# Patient Record
Sex: Male | Born: 1955 | Race: Black or African American | Hispanic: No | Smoking: Current every day smoker
Health system: Southern US, Community
[De-identification: ages and names within clinical notes are randomized; demographics above are authoritative.]

## PROBLEM LIST (undated history)

## (undated) DIAGNOSIS — J449 Chronic obstructive pulmonary disease, unspecified: Secondary | ICD-10-CM

## (undated) DIAGNOSIS — R569 Unspecified convulsions: Secondary | ICD-10-CM

## (undated) DIAGNOSIS — I639 Cerebral infarction, unspecified: Secondary | ICD-10-CM

## (undated) DIAGNOSIS — F329 Major depressive disorder, single episode, unspecified: Secondary | ICD-10-CM

## (undated) DIAGNOSIS — F32A Depression, unspecified: Secondary | ICD-10-CM

## (undated) DIAGNOSIS — I10 Essential (primary) hypertension: Secondary | ICD-10-CM

## (undated) DIAGNOSIS — N400 Enlarged prostate without lower urinary tract symptoms: Secondary | ICD-10-CM

## (undated) HISTORY — DX: Essential (primary) hypertension: I10

## (undated) HISTORY — DX: Depression, unspecified: F32.A

## (undated) HISTORY — DX: Major depressive disorder, single episode, unspecified: F32.9

## (undated) HISTORY — PX: OTHER SURGICAL HISTORY: SHX169

## (undated) HISTORY — DX: Unspecified convulsions: R56.9

---

## 2002-12-16 ENCOUNTER — Encounter: Payer: Self-pay | Admitting: Emergency Medicine

## 2002-12-16 ENCOUNTER — Emergency Department (HOSPITAL_COMMUNITY): Admission: EM | Admit: 2002-12-16 | Discharge: 2002-12-16 | Payer: Self-pay | Admitting: Emergency Medicine

## 2004-08-28 HISTORY — PX: CERVICAL SPINE SURGERY: SHX589

## 2005-02-24 ENCOUNTER — Emergency Department (HOSPITAL_COMMUNITY): Admission: EM | Admit: 2005-02-24 | Discharge: 2005-02-24 | Payer: Self-pay | Admitting: Emergency Medicine
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
## 2009-04-26 ENCOUNTER — Encounter: Admission: RE | Admit: 2009-04-26 | Discharge: 2009-04-26 | Payer: Self-pay | Admitting: Family Medicine

## 2009-04-26 IMAGING — CR DG CHEST 2V
2 series · 2 of 2 positions shown · non-contrast
Comparison: [DATE]

CLINICAL DATA: Chronic cough and weight loss.  Smoker.

CHEST - 2 VIEW

[view not recorded (1 of 2)]
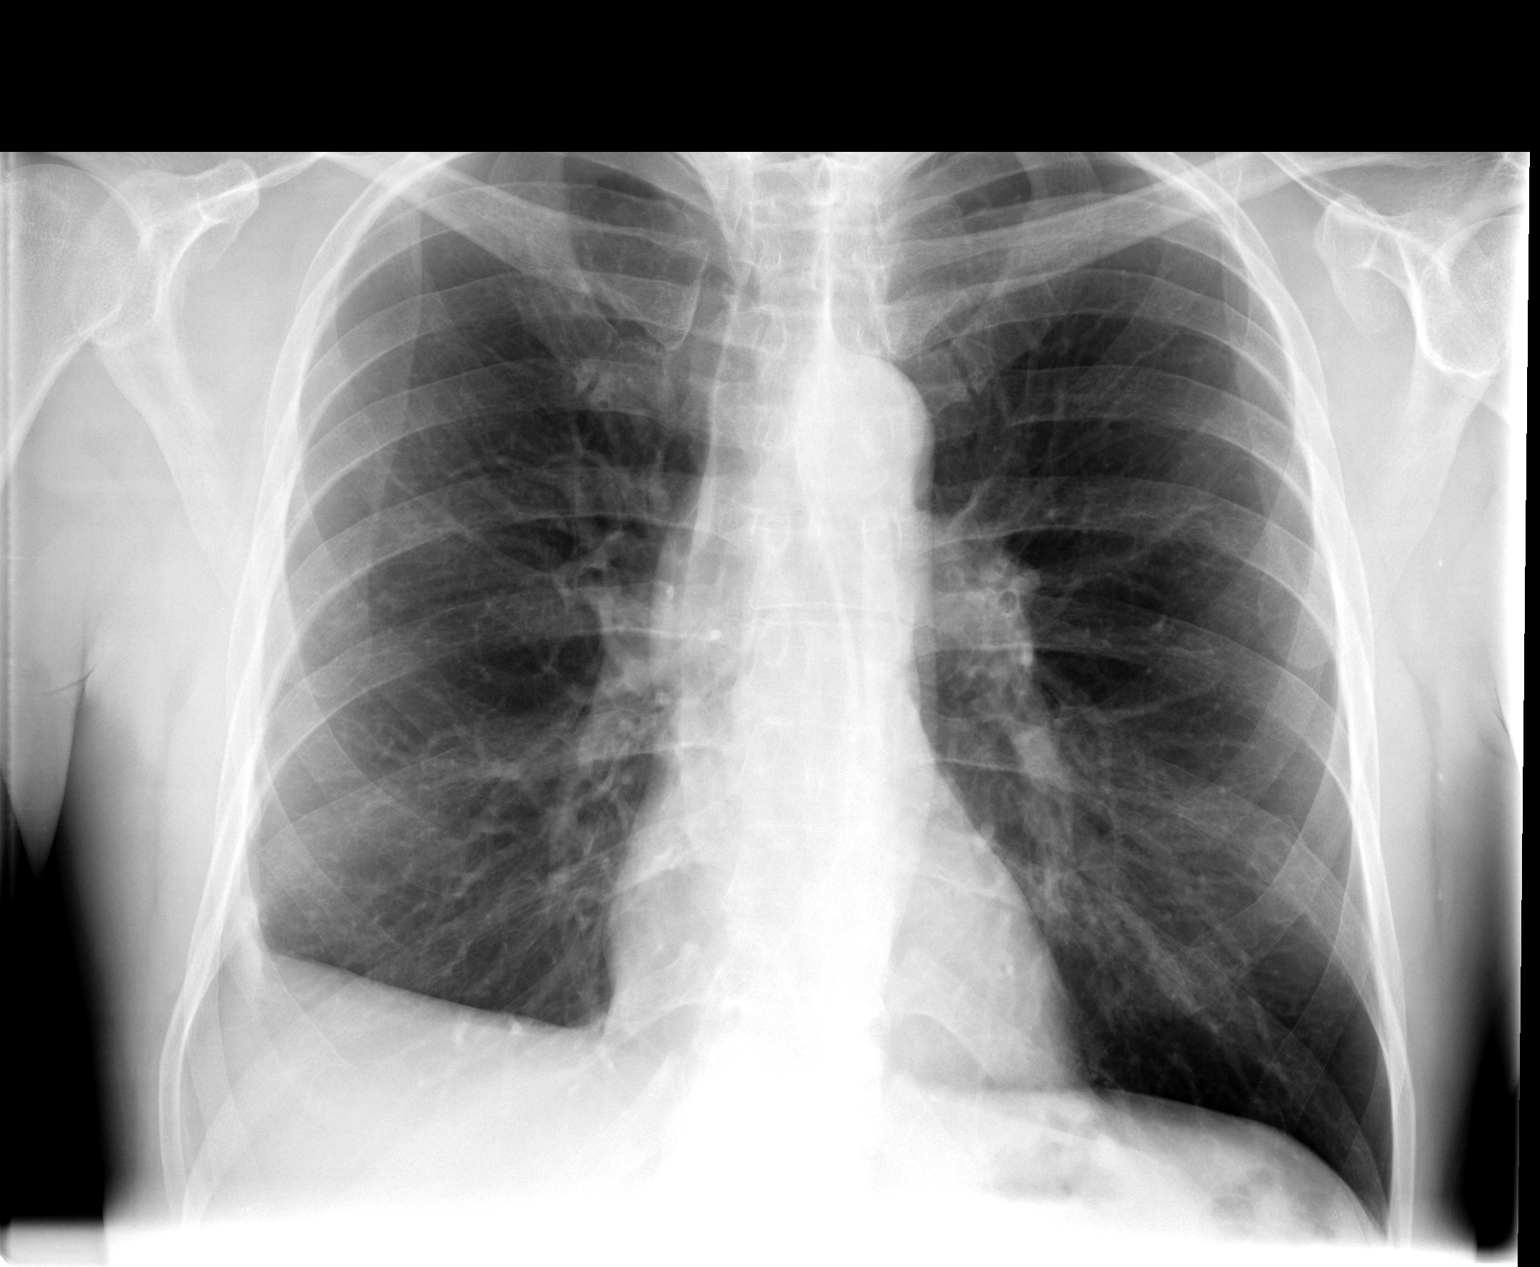

[view not recorded (2 of 2)]
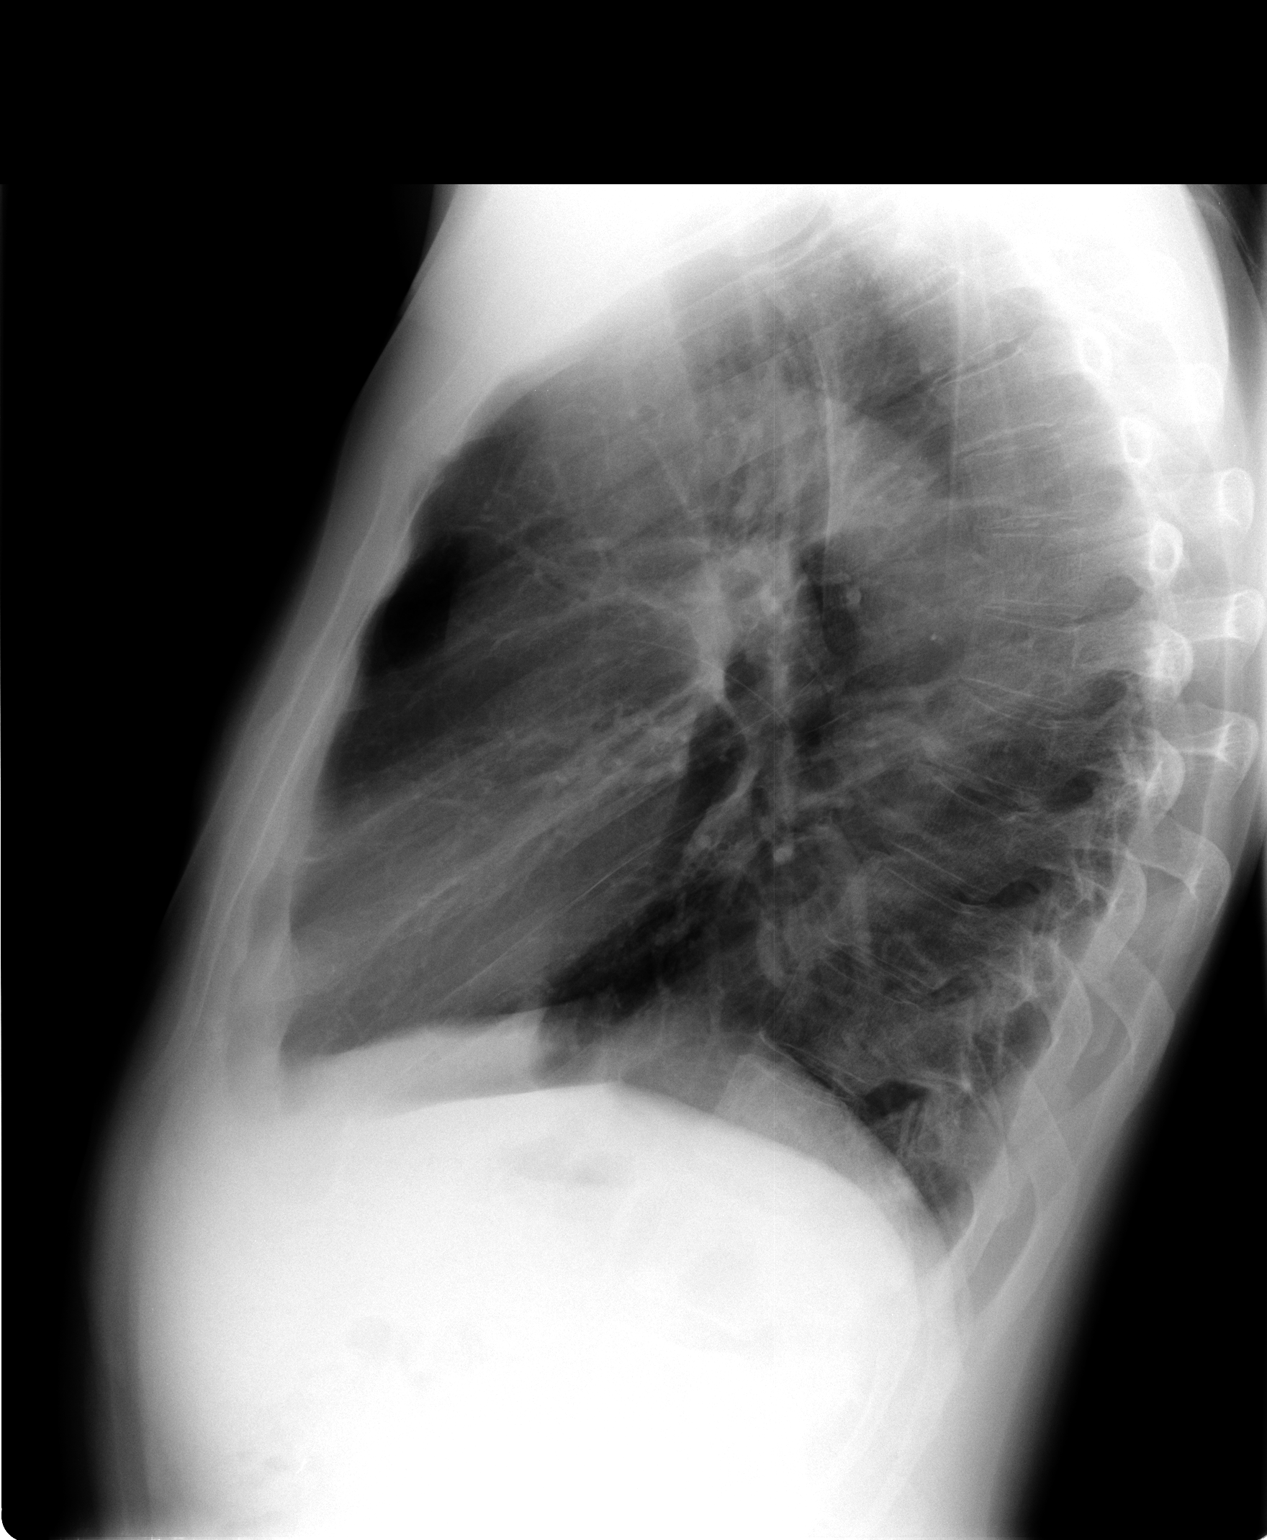

[2 of 2 positions shown; findings below may reference images not displayed]

FINDINGS: Trachea is midline.  Heart size normal.  Trachea is
midline.  Heart size normal.  Ascending aorta is mildly prominent.
There is new blunting of the costophrenic angle, with slight
elevation of the right hemidiaphragm.  Lungs are otherwise clear.
No pleural fluid.
IMPRESSION: Probable pleural parenchymal scarring at the right costophrenic
angle.  If further evaluation is desired, chest CT could be
performed.

## 2009-05-12 ENCOUNTER — Encounter: Admission: RE | Admit: 2009-05-12 | Discharge: 2009-05-12 | Payer: Self-pay | Admitting: Family Medicine

## 2009-05-12 IMAGING — CT CT HEAD W/O CM
1 series · 16 of 28 positions shown, 20 images · non-contrast
Comparison: [DATE]

CLINICAL DATA: Headache.  Memory loss.  Confusion.  Blurred vision.

CT HEAD WITHOUT CONTRAST
TECHNIQUE: Contiguous axial images were obtained from the base of
the skull through the vertex without contrast.

[Series 2: head · axial · 0.49mm/px · z∈[+27,+157]mm · 16 of 28 slices shown, 20 images]
[im 2/28  brain]
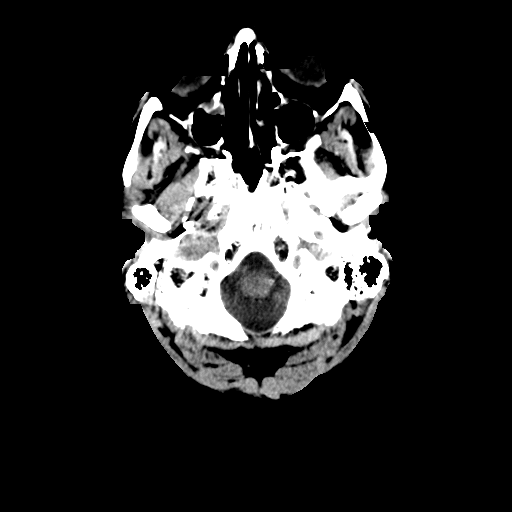
[im 2/28  bone]
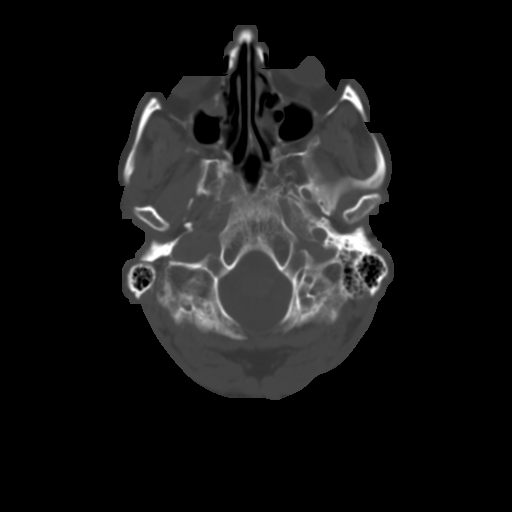
[im 4/28  brain]
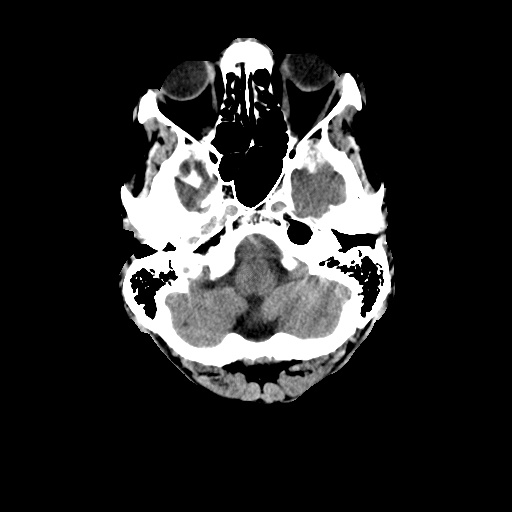
[im 6/28  brain]
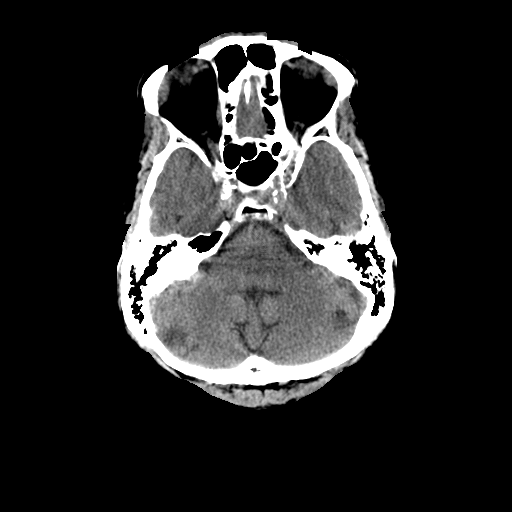
[im 7/28  brain]
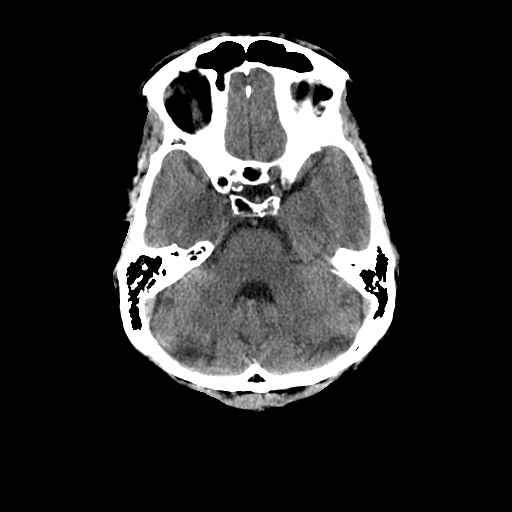
[im 9/28  brain]
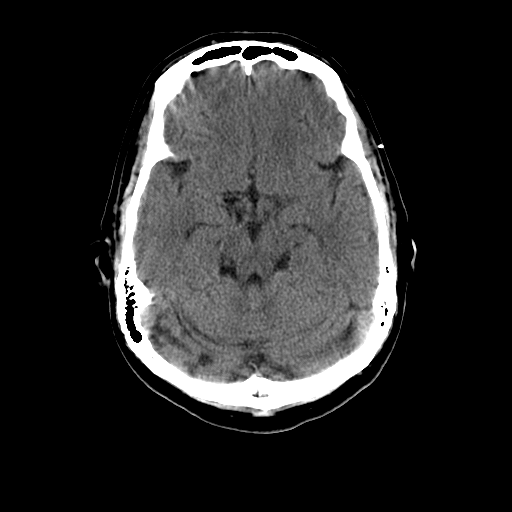
[im 9/28  bone]
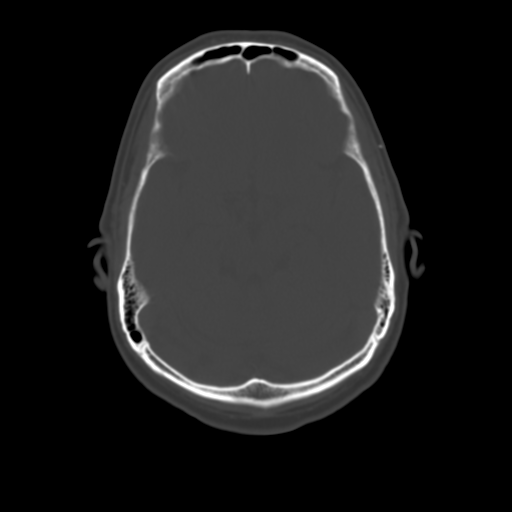
[im 10/28  brain]
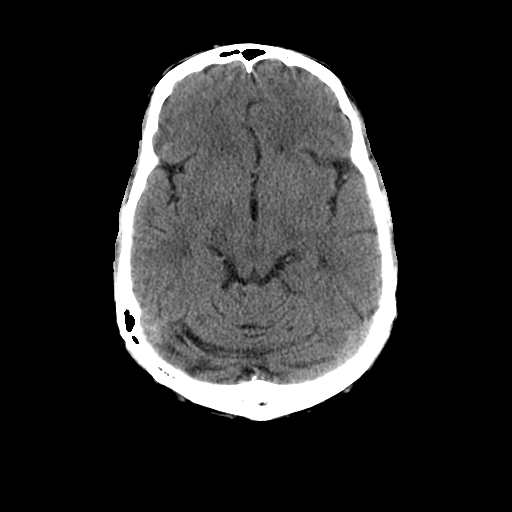
[im 12/28  brain]
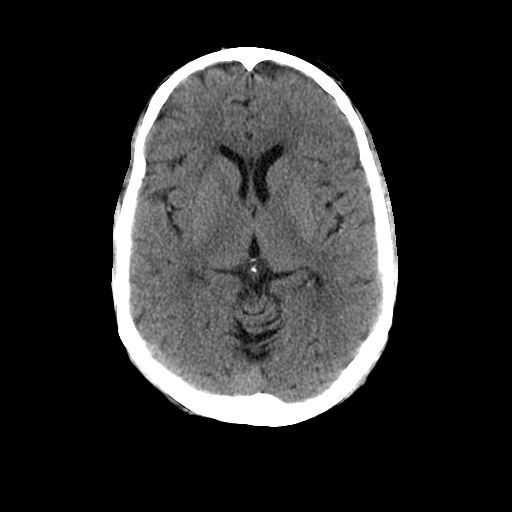
[im 14/28  brain]
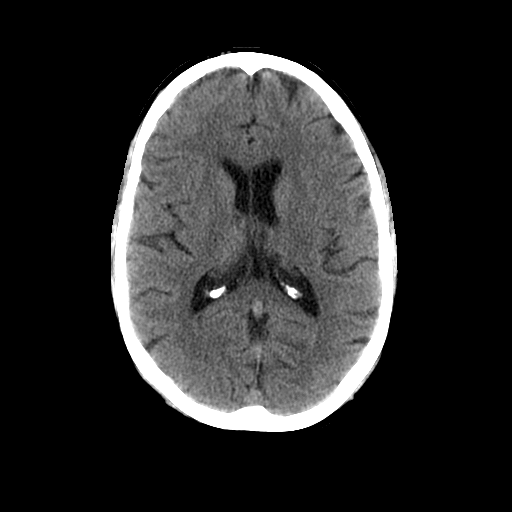
[im 15/28  brain]
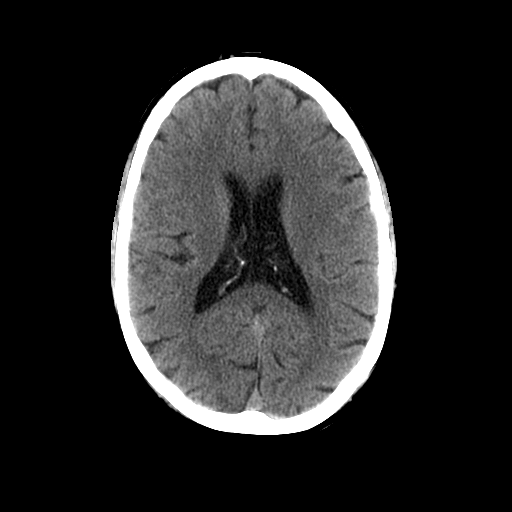
[im 15/28  bone]
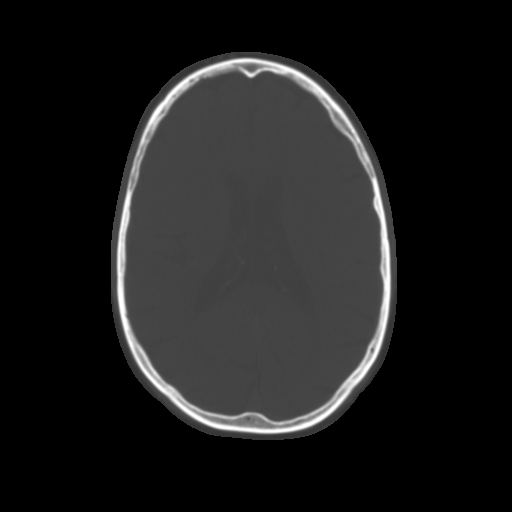
[im 17/28  brain]
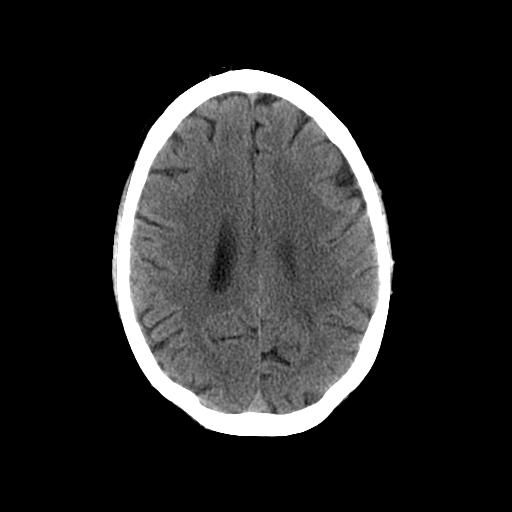
[im 19/28  brain]
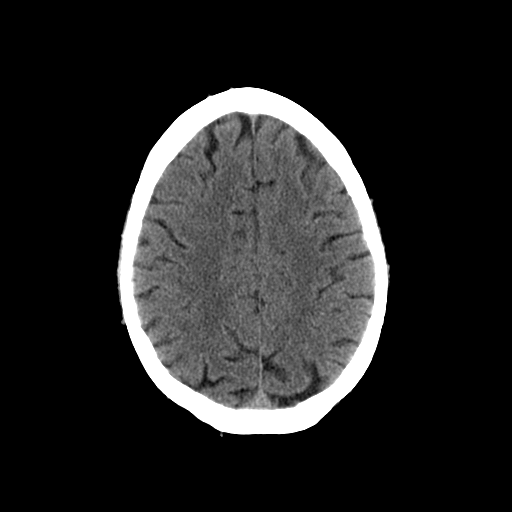
[im 20/28  brain]
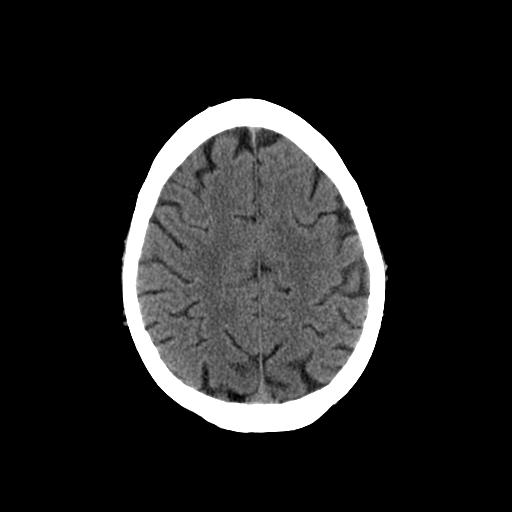
[im 22/28  brain]
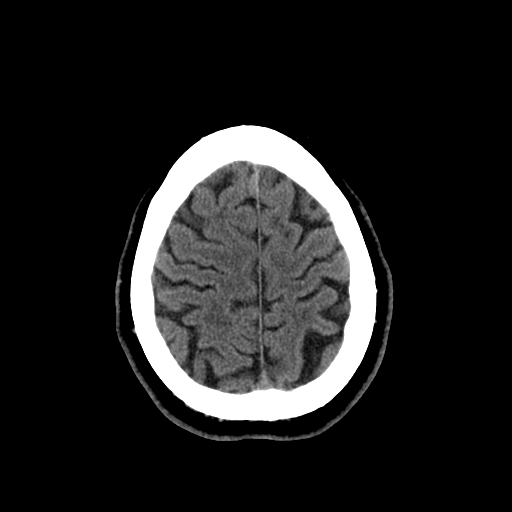
[im 22/28  bone]
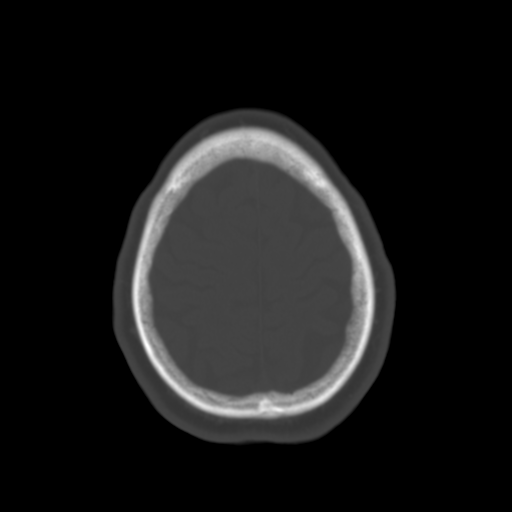
[im 23/28  brain]
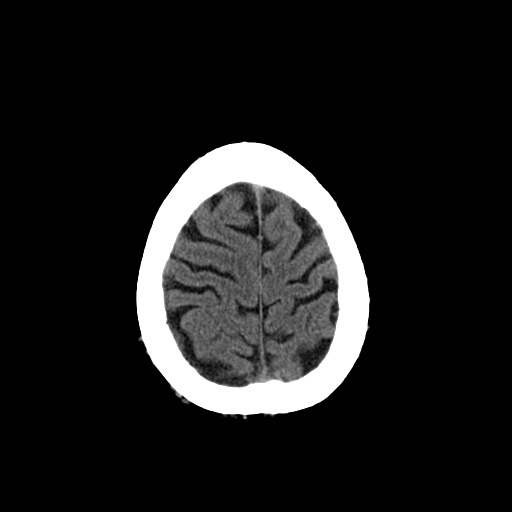
[im 25/28  brain]
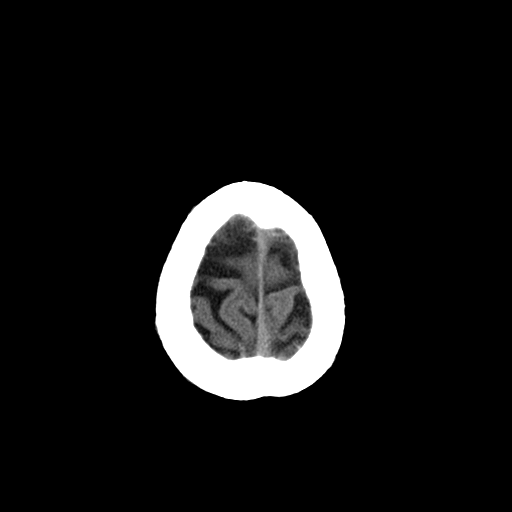
[im 27/28  brain]
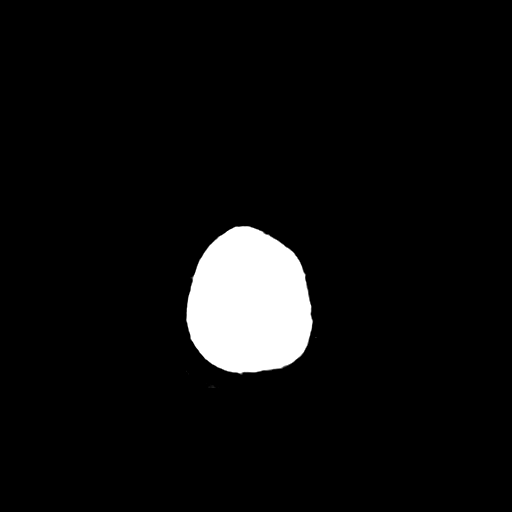

[16 of 28 positions shown; findings below may reference images not displayed]

FINDINGS: There is some progression of generalized brain atrophy.
Old cerebellar infarctions remain evident.  Within the cerebral
hemispheres, there are chronic appearing deep white matter changes
consistent with small vessel disease.  No cortical or large vessel
cerebral hemispheric infarction.  No mass lesion, hemorrhage,
hydrocephalus or extra-axial collection.  Atherosclerotic
calcification effects the major vessels at the base of the brain.
IMPRESSION: Atrophy and chronic cerebellar and cerebral white matter
infarctions.  No sign of acute or reversible process.  Atrophy is
somewhat progressive since the previous exam.

## 2009-08-11 ENCOUNTER — Encounter: Admission: RE | Admit: 2009-08-11 | Discharge: 2009-11-09 | Payer: Self-pay | Admitting: Neurology

## 2010-07-27 ENCOUNTER — Emergency Department (HOSPITAL_COMMUNITY)
Admission: EM | Admit: 2010-07-27 | Discharge: 2010-07-27 | Payer: Self-pay | Source: Home / Self Care | Admitting: Emergency Medicine

## 2010-09-19 ENCOUNTER — Encounter: Payer: Self-pay | Admitting: Family Medicine

## 2011-09-07 ENCOUNTER — Emergency Department (HOSPITAL_COMMUNITY)
Admission: EM | Admit: 2011-09-07 | Discharge: 2011-09-07 | Disposition: A | Discharge status: Home / Self Care | Payer: BC Managed Care – PPO | Attending: Emergency Medicine | Admitting: Emergency Medicine

## 2011-09-07 ENCOUNTER — Encounter (HOSPITAL_COMMUNITY): Payer: Self-pay

## 2011-09-07 DIAGNOSIS — R609 Edema, unspecified: Secondary | ICD-10-CM | POA: Insufficient documentation

## 2011-09-07 DIAGNOSIS — M25579 Pain in unspecified ankle and joints of unspecified foot: Secondary | ICD-10-CM | POA: Insufficient documentation

## 2011-09-07 DIAGNOSIS — M25473 Effusion, unspecified ankle: Secondary | ICD-10-CM | POA: Insufficient documentation

## 2011-09-07 DIAGNOSIS — M109 Gout, unspecified: Secondary | ICD-10-CM | POA: Insufficient documentation

## 2011-09-07 DIAGNOSIS — M25476 Effusion, unspecified foot: Secondary | ICD-10-CM | POA: Insufficient documentation

## 2011-09-07 DIAGNOSIS — M79609 Pain in unspecified limb: Secondary | ICD-10-CM | POA: Insufficient documentation

## 2011-09-07 MED ORDER — INDOMETHACIN 25 MG PO CAPS
50.0000 mg | ORAL_CAPSULE | Freq: Once | ORAL | Status: AC
  Administered 2011-09-07: 50 mg via ORAL
  Filled 2011-09-07: qty 1

## 2011-09-07 MED ORDER — INDOMETHACIN 50 MG PO CAPS: 50.0000 mg | ORAL_CAPSULE | Freq: Three times a day (TID) | ORAL | Status: AC

## 2011-09-07 NOTE — ED Provider Notes (Signed)
Medical screening examination/treatment/procedure(s) were performed by non-physician practitioner and as supervising physician I was immediately available for consultation/collaboration.  Keiandre Cygan R. Ahyan Kreeger, MD 09/07/11 1404 

## 2011-09-07 NOTE — ED Provider Notes (Signed)
History     CSN: [id number]  Arrival date & time 09/07/11  4098   First MD Initiated Contact with Patient 09/07/11 [id number]      Chief Complaint  Patient presents with  . Gout    (Consider location/radiation/quality/duration/timing/severity/associated sxs/prior treatment) HPI Comments: Patient here with a gradual increase in left great toe pain - states that he has a history of gout in the toe - is not on medications currently - states that this flare has produced swelling and redness in the joint - denies any other symptoms.  Patient is a 56 y.o. male presenting with toe pain. The history is provided by the patient. No language interpreter was used.  Toe Pain This is a recurrent problem. The current episode started in the past 7 days. The problem occurs constantly. The problem has been gradually worsening. Associated symptoms include joint swelling. Pertinent negatives include no abdominal pain, arthralgias, chest pain, chills, diaphoresis, fever, headaches, myalgias, neck pain, numbness, rash, swollen glands, visual change or vomiting. The symptoms are aggravated by bending, standing and walking. He has tried nothing for the symptoms. The treatment provided no relief.    Past Medical History  Diagnosis Date  . Gout     Past Surgical History  Procedure Date  . Cervical     cervical disc fusion    History reviewed. No pertinent family history.  History  Substance Use Topics  . Smoking status: Former Games developer  . Smokeless tobacco: Not on file  . Alcohol Use: Yes     weekend use only      Review of Systems  Constitutional: Negative for fever, chills and diaphoresis.  HENT: Negative for neck pain.   Cardiovascular: Negative for chest pain.  Gastrointestinal: Negative for vomiting and abdominal pain.  Musculoskeletal: Positive for joint swelling. Negative for myalgias and arthralgias.  Skin: Negative for rash.  Neurological: Negative for numbness and headaches.  All other  systems reviewed and are negative.    Allergies  Review of patient's allergies indicates no known allergies.  Home Medications   Current Outpatient Rx  Name Route Sig Dispense Refill  . ASPIRIN 325 MG PO TABS Oral Take 325 mg by mouth daily as needed. As needed for pain.      BP 147/95  Pulse 97  Temp(Src) 98.2 F (36.8 C) (Oral)  Resp 16  SpO2 100%  Physical Exam  Nursing note and vitals reviewed. Constitutional: He is oriented to person, place, and time. He appears well-developed and well-nourished. No distress.  HENT:  Head: Normocephalic and atraumatic.  Right Ear: External ear normal.  Left Ear: External ear normal.  Nose: Nose normal.  Mouth/Throat: Oropharynx is clear and moist. No oropharyngeal exudate.  Eyes: Conjunctivae are normal. Pupils are equal, round, and reactive to light. No scleral icterus.  Neck: Normal range of motion. Neck supple.  Cardiovascular: Normal rate, regular rhythm and normal heart sounds.  Exam reveals no gallop and no friction rub.   No murmur heard. Pulmonary/Chest: Effort normal and breath sounds normal. He exhibits no tenderness.  Abdominal: Soft. Bowel sounds are normal. He exhibits no distension. There is no tenderness.  Musculoskeletal: He exhibits edema and tenderness.       ttp and edema noted to left MTP joint  Lymphadenopathy:    He has no cervical adenopathy.  Neurological: He is alert and oriented to person, place, and time. No cranial nerve deficit.  Skin: Skin is warm and dry. No rash noted. There is erythema. No pallor.  Mild erythema noted to left great toe at MTP joint  Psychiatric: He has a normal mood and affect. His behavior is normal. Thought content normal.    ED Course  Procedures (including critical care time)  Labs Reviewed - No data to display No results found.   Gout    MDM  Patient here with gout flare - will start on indomethacin and he will follow up with PCP.        Izola Price  [place], [place] 09/07/11 [phone]

## 2011-09-07 NOTE — ED Notes (Signed)
Pt states that for the past 3 days he thinks that he has been having gout pain. He states that he is having pain in his left great toe which has caused him to walk with a limp and now he has developed some right hip pain. Asa pta with no relief.

## 2011-12-06 ENCOUNTER — Encounter (HOSPITAL_COMMUNITY): Payer: Self-pay | Admitting: Cardiology

## 2011-12-06 ENCOUNTER — Emergency Department (INDEPENDENT_AMBULATORY_CARE_PROVIDER_SITE_OTHER)
Admission: EM | Admit: 2011-12-06 | Discharge: 2011-12-06 | Disposition: A | Discharge status: Home / Self Care | Payer: BC Managed Care – PPO | Source: Home / Self Care | Attending: Emergency Medicine | Admitting: Emergency Medicine

## 2011-12-06 DIAGNOSIS — Z113 Encounter for screening for infections with a predominantly sexual mode of transmission: Secondary | ICD-10-CM

## 2011-12-06 NOTE — ED Notes (Signed)
Pt reports his girlfriend has be diagnosed with MPC (Mucopurulent Cervicitis) he is here for treatment as advised by the Health Department. The pt denies any symptoms. He has no penile discharge, burning or itching.

## 2011-12-06 NOTE — ED Provider Notes (Signed)
Chief Complaint  Patient presents with  . Exposure to STD    History of Present Illness:   Seth [name] is a 56 year old male who was recently informed by his girlfriend that she has mucopurulent cervicitis. She has not been diagnosed with Chlamydia or gonorrhea. She doesn't have any symptoms of Trichomonas or herpes. She also has not been diagnosed with HPV. Seth [name] himself has been completely asymptomatic without any urethral discharge, dysuria, penile pain, penile lesions, adenopathy, fever, chills, skin rash, or joint pain. He and his girlfriend are sexually active without any protection, but they have both been in an exclusive, mutually monogamous relationship for years.  Review of Systems:  Other than noted above, the patient denies any of the following symptoms: Systemic:  No fevers chills, aches, weight loss, arthralgias, myalgias, or adenopathy. GI:  No abdominal pain, nausea or vomiting. GU:  No dysuria, penile pain, discharge, itching, dysuria, genital lesions, testicular pain or swelling. Skin:  No rash or itching.  PMFSH:  Past medical history, family history, social history, meds, and allergies were reviewed.  Physical Exam:   Vital signs:  BP 163/86  Pulse 96  Temp(Src) 98.5 F (36.9 C) (Oral)  Resp 18  SpO2 98% Gen:  Alert, oriented, in no distress. Abdomen:  Soft and flat, non-distended, and non-tender.  No organomegaly or mass. Genital:  No urethral discharge, no penile lesions, sores, blisters, or warts, no evidence of herpes or HPV, testes are normal, no inguinal adenopathy. Skin:  Warm and dry.  No rash.   Other Labs Obtained at Urgent Care Center:  GC and Chlamydia DNA probe were obtained as well as serology for HSV, HIV, and syphilis.  Results are pending at this time and we will call about any positive results.  Assessment:   Diagnoses that have been ruled out:  None  Diagnoses that are still under consideration:  None  Final diagnoses:  Screening for STD (sexually  transmitted disease)    Plan:   1.  The following meds were prescribed:   New Prescriptions   No medications on file   2.  The patient was instructed in symptomatic care and handouts were given. 3.  The patient was told to return if becoming worse in any way, if no better in 3 or 4 days, and given some red flag symptoms that would indicate earlier return.   Reuben Likes, MD 12/06/11 [phone]

## 2011-12-06 NOTE — Discharge Instructions (Signed)
Mucopurulent Cervicitis Mucopurulent cervicitis (MPC) is an infection of the cervix. MPC may be caused by bacteria or viruses that are spread during sexual contact. Examples of these germs are:  Gonorrhea.   Chlamydia.   Human papilloma virus (HPV).   Herpes virus.   Trichomonas.  The exact cause cannot always be determined until blood tests and cultures are done. TREATMENT   MPC will usually respond to oral or vaginal antibiotic medicine.   HPV of the cervix may have to be treated surgically with LEEP (electrocautery), cold knife cone of the cervix, or laser treatment of the cervix.   If bacterial vaginosis is present, it should be treated with antibiotics to help clear up MPC.  Finding out the results of your test Ask when your test results will be ready. Make sure you get your results. You may require additional treatment after your final test results are known. HOME CARE INSTRUCTIONS   Do not have sex until the test results are known, treatment is completed, and your caregiver says it is okay.   Take your antibiotics as directed. Finish them even if you start to feel better.   Tell any sexual partners you have had in the past 60 days if your test results are positive for gonorrhea, chlamydia, herpes, or trichomonas. They will need to see a caregiver for a checkup and will require treatment even if their testing is negative.   Practice safe sex to prevent sexually transmitted infections. Use condoms.   Avoid tight pants and panty hose.   Wear cotton underwear.   Do not douche.   Do not use tampons, especially scented ones.   Do not use perfumed soaps around the vagina.   Use steroid cream for itching or irritation of the vulva as directed by your caregiver.  MAKE SURE YOU:   Understand these instructions.   Will watch your condition.   Will get help right away if you are not doing well or get worse.  Document Released: 08/14/2005 Document Revised: 08/03/2011  Document Reviewed: 01/29/2007 [facility] Patient Information 2012 [place], [place].  You have been diagnosed with a possible STD.  Your results should be back in 3 days.  You can call us here at [phone] and ask for [facility].  She can tell you whether or not your results are back, but you must come here to get your results.  We do this to protect our patients' confidentiality.  You can come Monday through Friday and tell the receptionist that your are just here to get test results.  In the meantime, you should avoid intercourse altogether for 1 week.  After that, you should always use condoms--100% of the time.  This will not only prevent pregnancy, but has been shown to prevent HIV, syphilis, gonorrhea, chlamydia, hepatis C and other STDs.  If your test comes back positive, we are required by law to report it to the Health Department.  We also suggest you inform your partner or partners so they can get tested and treated as well.

## 2011-12-07 LAB — RPR: RPR Ser Ql: NONREACTIVE

## 2011-12-11 ENCOUNTER — Telehealth (HOSPITAL_COMMUNITY): Payer: Self-pay | Admitting: *Deleted

## 2011-12-11 NOTE — ED Notes (Signed)
Labs shown to  Dr. Lorenz Coaster and he said pt. can take Acyclovir everyday to prevent an outbreak.  He would prescribe it, if pt. wants it.  I told him pt. was coming tomorrow for his HIV result and I would ask him. Vassie Moselle 12/11/2011

## 2011-12-11 NOTE — ED Notes (Signed)
Pt. called for his lab results. 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  Pt. verified x 2 and given results except HIV.  Pt. said he would come tomorrow to view that result.  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  Pt. denies ever having an outbreak.  Pt.iInstructed to notify his partner. You can pass the virus even when you don't have an outbreak, so always practice safe sex. Get treated for each outbreak with Acyclovir or Valtrex. Vassie Moselle 12/11/2011

## 2012-05-18 ENCOUNTER — Encounter (HOSPITAL_COMMUNITY): Payer: Self-pay | Admitting: *Deleted

## 2012-05-18 ENCOUNTER — Emergency Department (HOSPITAL_COMMUNITY)
Admission: EM | Admit: 2012-05-18 | Discharge: 2012-05-18 | Disposition: A | Discharge status: Home / Self Care | Payer: BC Managed Care – PPO | Attending: Emergency Medicine | Admitting: Emergency Medicine

## 2012-05-18 DIAGNOSIS — F17200 Nicotine dependence, unspecified, uncomplicated: Secondary | ICD-10-CM | POA: Insufficient documentation

## 2012-05-18 DIAGNOSIS — M109 Gout, unspecified: Secondary | ICD-10-CM | POA: Insufficient documentation

## 2012-05-18 DIAGNOSIS — R51 Headache: Secondary | ICD-10-CM

## 2012-05-18 DIAGNOSIS — Z981 Arthrodesis status: Secondary | ICD-10-CM | POA: Insufficient documentation

## 2012-05-18 LAB — CBC WITH DIFFERENTIAL/PLATELET
Basophils Absolute: 0 10*3/uL (ref 0.0–0.1)
Basophils Relative: 0 % (ref 0–1)
Eosinophils Absolute: 0.1 10*3/uL (ref 0.0–0.7)
Eosinophils Relative: 1 % (ref 0–5)
HCT: 45.5 % (ref 39.0–52.0)
Lymphocytes Relative: 19 % (ref 12–46)
MCH: 31.4 pg (ref 26.0–34.0)
MCHC: 34.1 g/dL (ref 30.0–36.0)
Monocytes Absolute: 0.5 10*3/uL (ref 0.1–1.0)
Monocytes Relative: 5 % (ref 3–12)
Neutrophils Relative %: 74 % (ref 43–77)
Platelets: 234 10*3/uL (ref 150–400)
RBC: 4.93 MIL/uL (ref 4.22–5.81)

## 2012-05-18 LAB — COMPREHENSIVE METABOLIC PANEL
ALT: 15 U/L (ref 0–53)
BUN: 18 mg/dL (ref 6–23)
Calcium: 9.4 mg/dL (ref 8.4–10.5)
GFR calc Af Amer: 77 mL/min — ABNORMAL LOW (ref >90)
Glucose, Bld: 66 mg/dL — ABNORMAL LOW (ref 70–99)
Sodium: 138 mEq/L (ref 135–145)
Total Bilirubin: 0.5 mg/dL (ref 0.3–1.2)
Total Protein: 7.7 g/dL (ref 6.0–8.3)

## 2012-05-18 MED ORDER — IBUPROFEN 800 MG PO TABS
800.0000 mg | ORAL_TABLET | Freq: Once | ORAL | Status: AC
  Administered 2012-05-18: 800 mg via ORAL
  Filled 2012-05-18: qty 1

## 2012-05-18 NOTE — ED Notes (Signed)
Pt's CBG was 66 when I checked it and notified the RN Jessica 3:05pm JG.

## 2012-05-18 NOTE — ED Notes (Signed)
Patient reports he had a period of feeling dizzy and having a headache this week.  He rested and drank juice and felt better.  Today his sx 1 hour ago.   Patient denies chest pain.  He is nauseated

## 2012-05-18 NOTE — ED Provider Notes (Addendum)
History     CSN: [id number]  Arrival date & time 05/18/12  1304   First MD Initiated Contact with Patient 05/18/12 1504      Chief Complaint  Patient presents with  . Headache  . Dizziness    (Consider location/radiation/quality/duration/timing/severity/associated sxs/prior treatment) Patient is a 56 y.o. male presenting with headaches. The history is provided by the patient.  Headache  Pertinent negatives include no fever, no nausea and no vomiting.   56 year old, male, with no significant past medical history presents emergency department complaining of an occipital headache with dizziness.  For the past week.  His headache had been intermittent.  However, today.  It has been constant since this morning.  It is characterized by an ache, and dizziness, which he describes as a lightheadedness.  He does not have vertigo.  He has not had nausea, vomiting, fevers, chills, vision changes, weakness, or paresthesias.  He does not have a rash.    Past Medical History  Diagnosis Date  . Gout     Past Surgical History  Procedure Date  . Cervical     cervical disc fusion    No family history on file.  History  Substance Use Topics  . Smoking status: Current Every Day Smoker -- 1.0 packs/day    Types: Cigarettes  . Smokeless tobacco: Not on file  . Alcohol Use: Yes     weekend use only      Review of Systems  Constitutional: Negative for fever and chills.  HENT: Negative for neck pain.   Eyes: Negative for visual disturbance.  Gastrointestinal: Negative for nausea and vomiting.  Musculoskeletal: Negative for back pain.  Skin: Negative for rash.  Neurological: Positive for light-headedness and headaches. Negative for weakness.  Hematological: Does not bruise/bleed easily.  Psychiatric/Behavioral: Negative for confusion.  All other systems reviewed and are negative.    Allergies  Review of patient's allergies indicates no known allergies.  Home Medications   Current  Outpatient Rx  Name Route Sig Dispense Refill  . ACETAMINOPHEN 325 MG PO TABS Oral Take 650 mg by mouth every 6 (six) hours as needed. For pain      BP 145/88  Pulse 92  Temp 97.8 F (36.6 C) (Oral)  Resp 24  Ht 6\' 3"  (1.905 m)  Wt 182 lb (82.555 kg)  BMI 22.75 kg/m2  Physical Exam  Nursing note and vitals reviewed. Constitutional: He is oriented to person, place, and time. He appears well-developed and well-nourished. No distress.  HENT:  Head: Normocephalic and atraumatic.  Eyes: Conjunctivae normal and EOM are normal.  Neck: Normal range of motion. Neck supple.  Cardiovascular: Normal rate, regular rhythm and intact distal pulses.   Murmur heard. Pulmonary/Chest: Effort normal and breath sounds normal. No respiratory distress.  Abdominal: Soft. Bowel sounds are normal.  Musculoskeletal: Normal range of motion. He exhibits no edema.  Neurological: He is alert and oriented to person, place, and time. He has normal strength. He displays no tremor. No cranial nerve deficit or sensory deficit. Coordination normal. GCS eye subscore is 4. GCS verbal subscore is 5. GCS motor subscore is 6.       Romberg negative No pronator drift  Skin: Skin is warm and dry.  Psychiatric: He has a normal mood and affect. Thought content normal.    ED Course  Procedures (including critical care time) intermittent, headache, for a week with lightheadedness.  No other symptoms.  Headache is described as an ache, no alteration of mental status,  and no neurological deficits.  No systemic symptoms.  There are no indications for a CAT scan or further testing  Labs Reviewed  COMPREHENSIVE METABOLIC PANEL - Abnormal; Notable for the following:    Glucose, Bld 66 (*)     GFR calc non Af Amer 66 (*)     GFR calc Af Amer 77 (*)     All other components within normal limits  GLUCOSE, CAPILLARY - Abnormal; Notable for the following:    Glucose-Capillary 67 (*)     All other components within normal limits    GLUCOSE, CAPILLARY - Abnormal; Notable for the following:    Glucose-Capillary 66 (*)     All other components within normal limits  CBC WITH DIFFERENTIAL   No results found.   No diagnosis found.  4:21 PM Ha resolved  MDM  Headache, without signs of altered mental status neurological deficits.  Systemic or CNS infection        Cheri Guppy, MD 05/18/12 1525  Cheri Guppy, MD 05/18/12 [phone]

## 2012-10-28 ENCOUNTER — Emergency Department (HOSPITAL_COMMUNITY)
Admission: EM | Admit: 2012-10-28 | Discharge: 2012-10-28 | Disposition: A | Discharge status: Home / Self Care | Payer: BC Managed Care – PPO | Attending: Emergency Medicine | Admitting: Emergency Medicine

## 2012-10-28 ENCOUNTER — Encounter (HOSPITAL_COMMUNITY): Payer: Self-pay | Admitting: *Deleted

## 2012-10-28 ENCOUNTER — Emergency Department (HOSPITAL_COMMUNITY): Payer: BC Managed Care – PPO

## 2012-10-28 DIAGNOSIS — M109 Gout, unspecified: Secondary | ICD-10-CM | POA: Insufficient documentation

## 2012-10-28 DIAGNOSIS — F17200 Nicotine dependence, unspecified, uncomplicated: Secondary | ICD-10-CM | POA: Insufficient documentation

## 2012-10-28 DIAGNOSIS — M79674 Pain in right toe(s): Secondary | ICD-10-CM

## 2012-10-28 DIAGNOSIS — M79609 Pain in unspecified limb: Secondary | ICD-10-CM | POA: Insufficient documentation

## 2012-10-28 DIAGNOSIS — Z79899 Other long term (current) drug therapy: Secondary | ICD-10-CM | POA: Insufficient documentation

## 2012-10-28 DIAGNOSIS — R071 Chest pain on breathing: Secondary | ICD-10-CM | POA: Insufficient documentation

## 2012-10-28 DIAGNOSIS — M25511 Pain in right shoulder: Secondary | ICD-10-CM

## 2012-10-28 DIAGNOSIS — M25519 Pain in unspecified shoulder: Secondary | ICD-10-CM | POA: Insufficient documentation

## 2012-10-28 DIAGNOSIS — Z9889 Other specified postprocedural states: Secondary | ICD-10-CM | POA: Insufficient documentation

## 2012-10-28 LAB — CBC
HCT: 39.7 % (ref 39.0–52.0)
Hemoglobin: 13.8 g/dL (ref 13.0–17.0)
MCHC: 34.8 g/dL (ref 30.0–36.0)
MCV: 92.5 fL (ref 78.0–100.0)
Platelets: 245 10*3/uL (ref 150–400)
RBC: 4.29 MIL/uL (ref 4.22–5.81)
WBC: 8.8 10*3/uL (ref 4.0–10.5)

## 2012-10-28 LAB — BASIC METABOLIC PANEL
CO2: 27 mEq/L (ref 19–32)
Chloride: 99 mEq/L (ref 96–112)
GFR calc non Af Amer: 70 mL/min — ABNORMAL LOW (ref >90)

## 2012-10-28 LAB — POCT I-STAT TROPONIN I: Troponin i, poc: 0 ng/mL (ref 0.00–0.08)

## 2012-10-28 IMAGING — CR DG CHEST 2V
2 series · 2 of 2 positions shown · non-contrast
Comparison: [DATE]

CLINICAL DATA: Cough and congestion.

CHEST - 2 VIEW

[w chest pa]
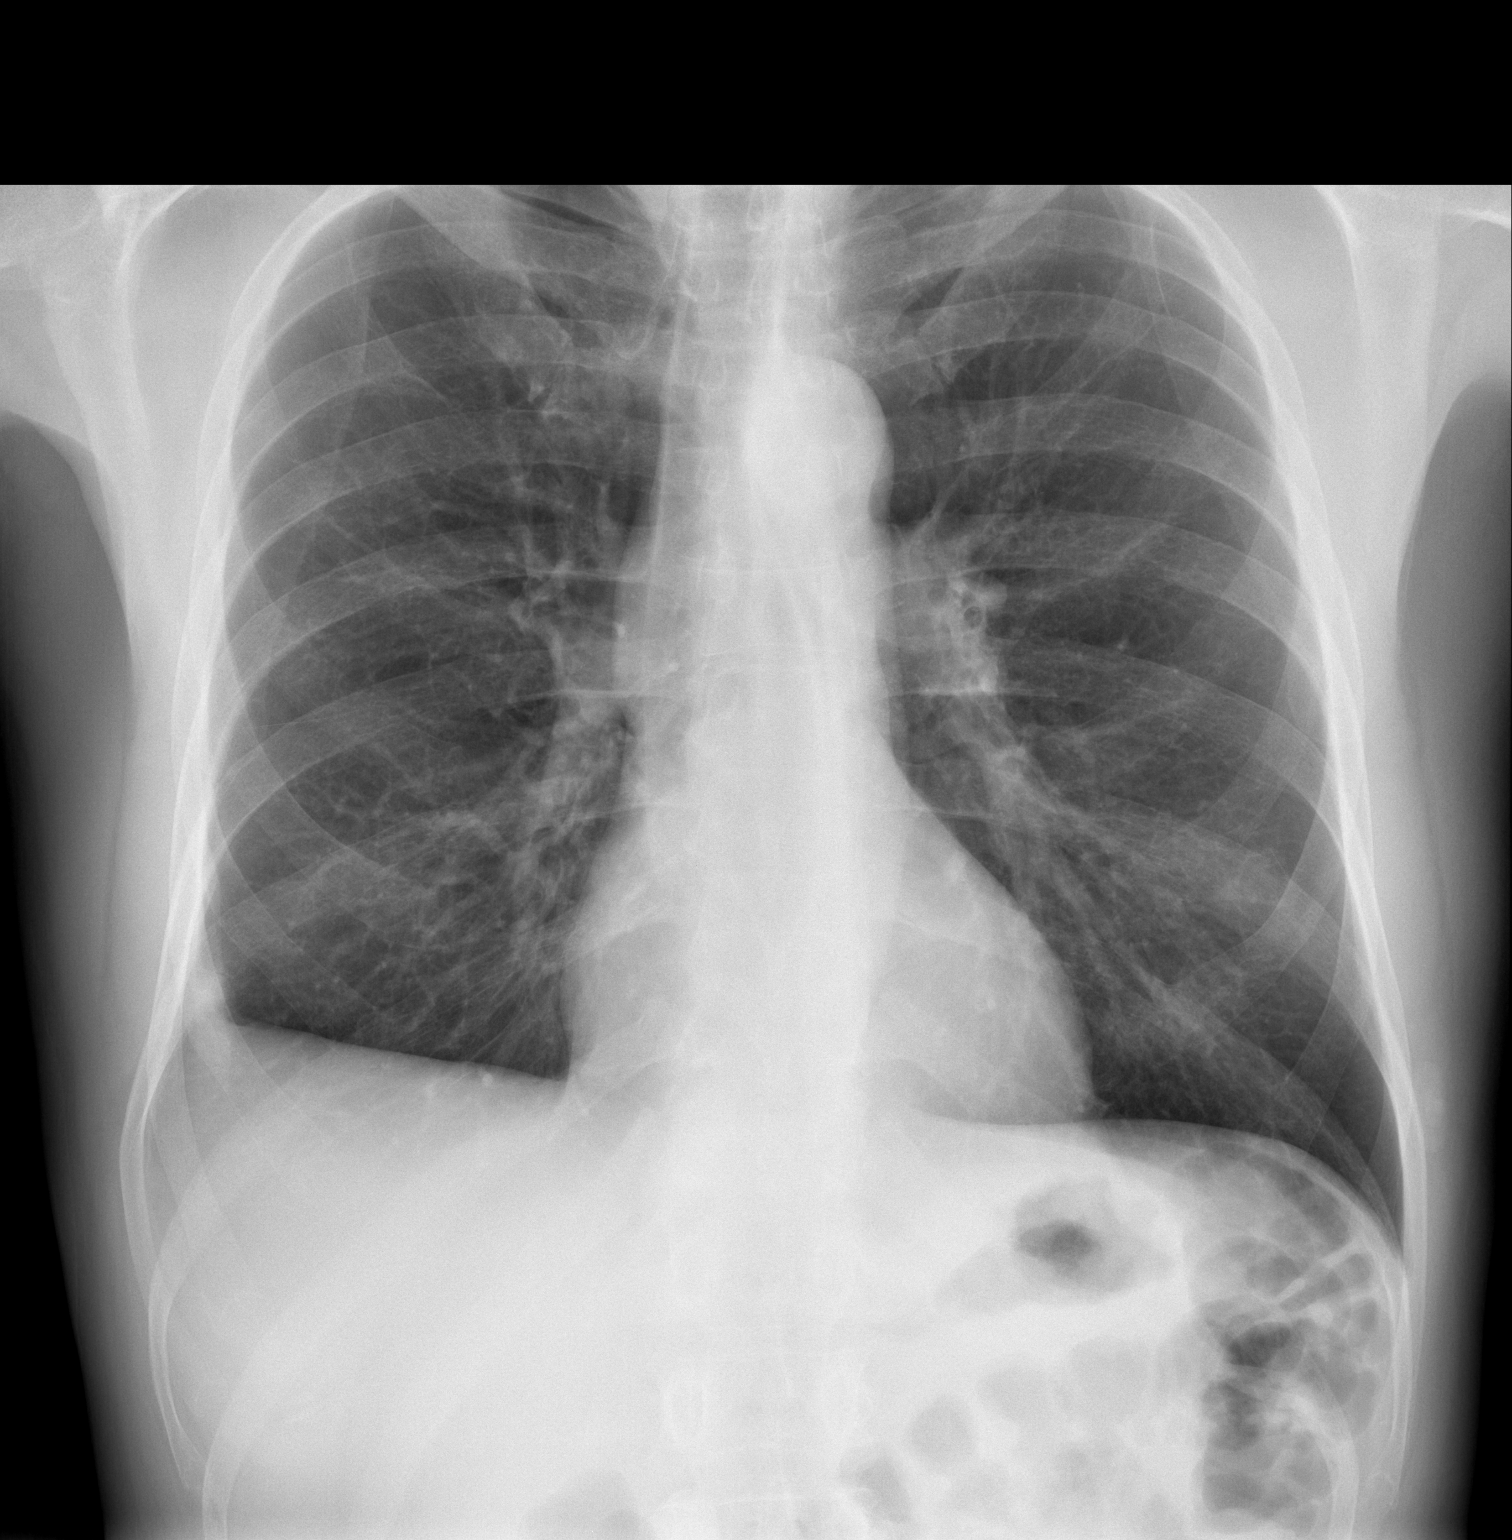

[w chest lat]
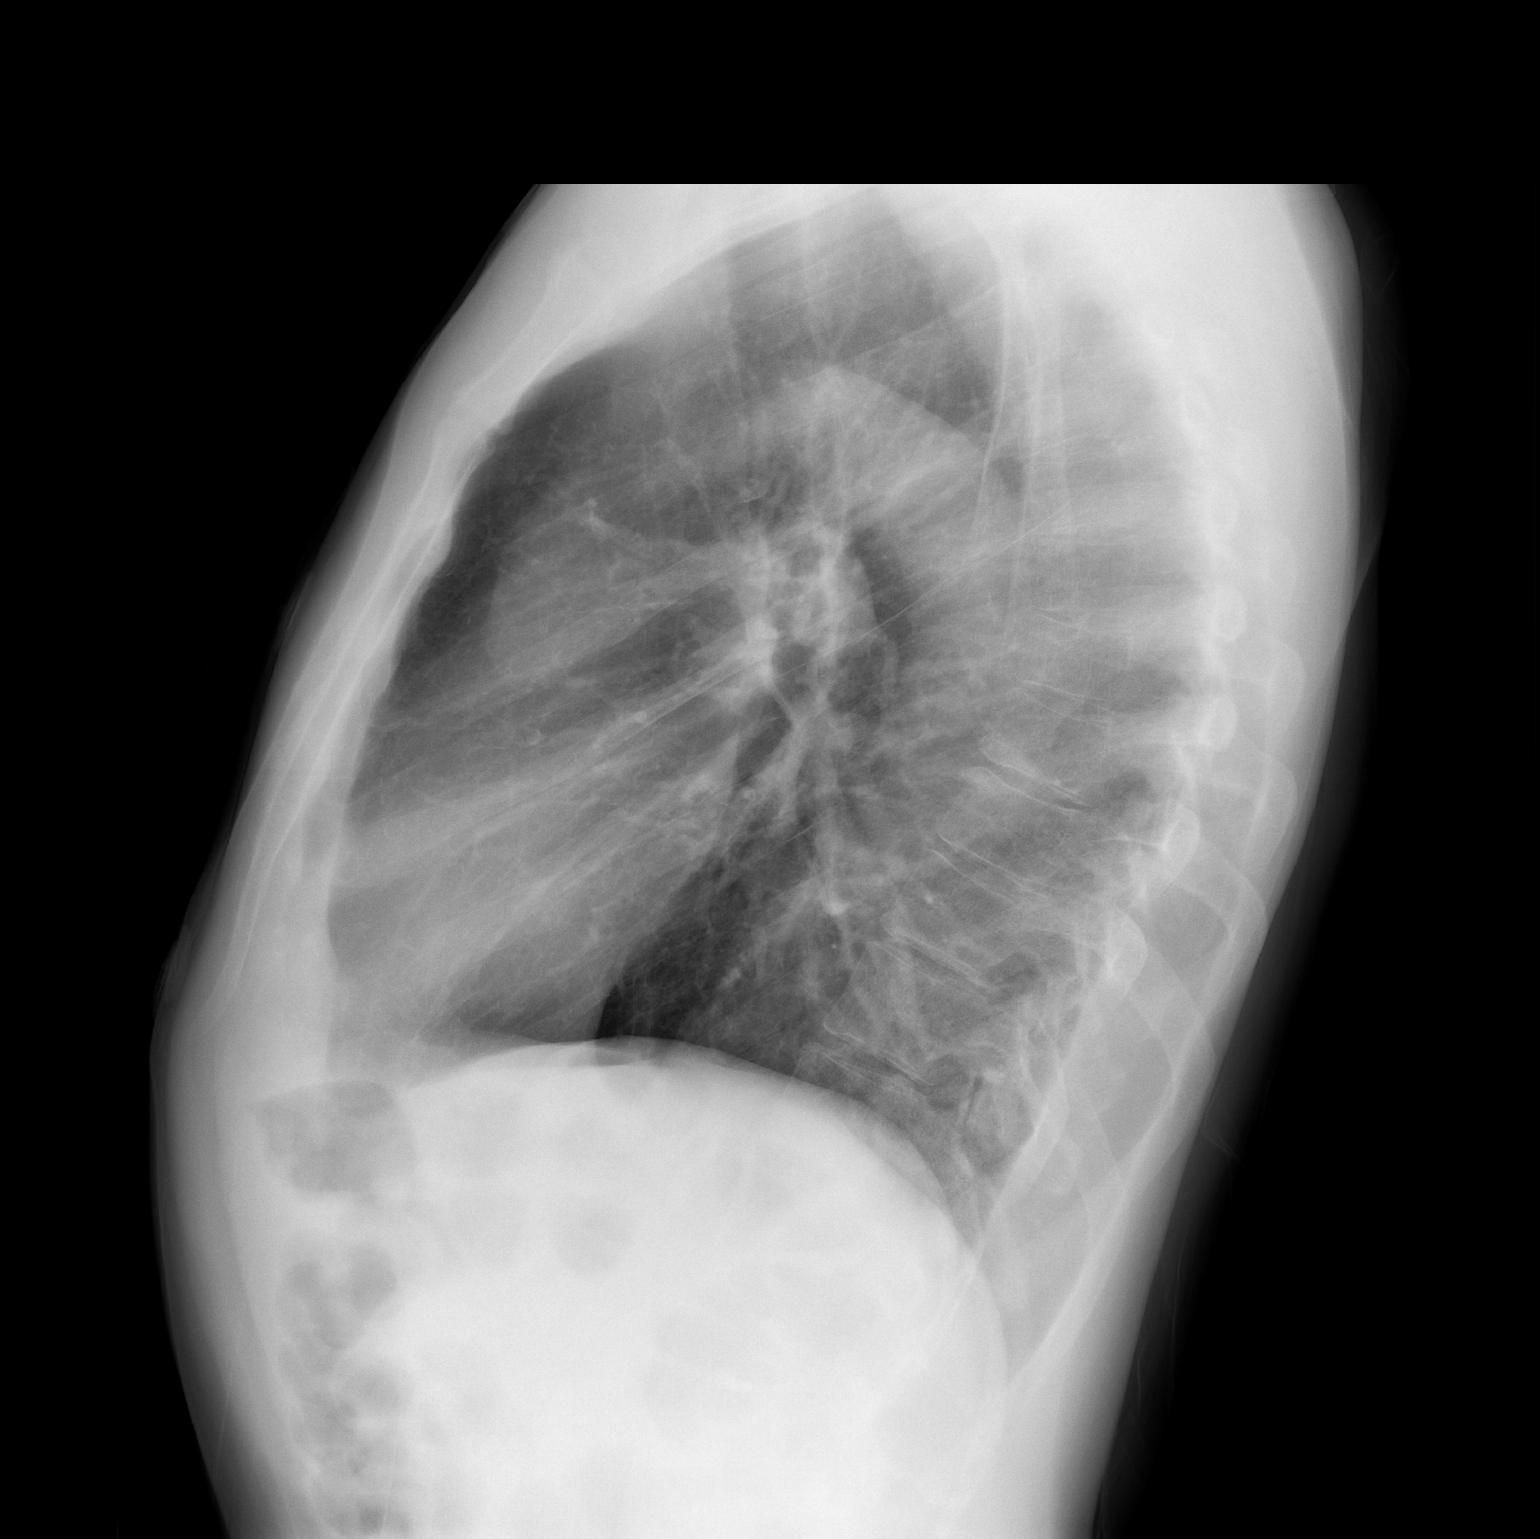

[2 of 2 positions shown; findings below may reference images not displayed]

FINDINGS: Stable asymmetric elevation of the right hemidiaphragm
with scarring in the right costophrenic angle.  No focal airspace
consolidation or pulmonary edema. The cardiopericardial silhouette
is within normal limits for size. Imaged bony structures of the
thorax are intact.
IMPRESSION: Stable.  Scarring at the peripheral right base.  No new or acute
interval findings.

## 2012-10-28 MED ORDER — INDOMETHACIN 50 MG PO CAPS: 50.0000 mg | ORAL_CAPSULE | Freq: Three times a day (TID) | ORAL | Status: DC | PRN

## 2012-10-28 NOTE — ED Provider Notes (Signed)
History     CSN: [id number]  Arrival date & time 10/28/12  1424   First MD Initiated Contact with Patient 10/28/12 1756      Chief Complaint  Patient presents with  . Chest Pain  . Toe Pain    (Consider location/radiation/quality/duration/timing/severity/associated sxs/prior treatment) HPI Comments: Seth [name] is a 57 y.o. Male who states that he has achiness in his right great toe, and right shoulder, since running out of his indomethacin, several weeks ago. No specific trauma. No other illness. He has not had any fever, chills, nausea, vomiting, shortness of breath, abdominal pain, back pain, weakness, or dizziness. He has noticed a mild right anterior chest wall pain. That is associated with the right shoulder pain. He has a history of chronic neck pain, and is status post cervical surgery. His pain in his toe, and  shoulder are worse with movement. They improve, with rest. There are no other modifying factors.  Patient is a 57 y.o. male presenting with chest pain and toe pain. The history is provided by the patient.  Chest Pain Toe Pain Associated symptoms include chest pain.    Past Medical History  Diagnosis Date  . Gout     Past Surgical History  Procedure Laterality Date  . Cervical      cervical disc fusion    No family history on file.  History  Substance Use Topics  . Smoking status: Current Every Day Smoker -- 1.00 packs/day    Types: Cigarettes  . Smokeless tobacco: Not on file  . Alcohol Use: Yes     Comment: weekend use only      Review of Systems  Cardiovascular: Positive for chest pain.  All other systems reviewed and are negative.    Allergies  Review of patient's allergies indicates no known allergies.  Home Medications   Current Outpatient Rx  Name  Route  Sig  Dispense  Refill  . indomethacin (INDOCIN) 50 MG capsule   Oral   Take 1 capsule (50 mg total) by mouth 3 (three) times daily as needed (shoulder or toe pain).   90  capsule   0     BP 163/93  Pulse 80  Temp(Src) 98.4 F (36.9 C) (Oral)  Resp 19  SpO2 100%  Physical Exam  Nursing note and vitals reviewed. Constitutional: He is oriented to person, place, and time. He appears well-developed and well-nourished.  HENT:  Head: Normocephalic and atraumatic.  Right Ear: External ear normal.  Left Ear: External ear normal.  Eyes: Conjunctivae and EOM are normal. Pupils are equal, round, and reactive to light.  Neck: Normal range of motion and phonation normal. Neck supple.  Cardiovascular: Normal rate, regular rhythm, normal heart sounds and intact distal pulses.   Pulmonary/Chest: Effort normal and breath sounds normal. He exhibits no bony tenderness.  Abdominal: Soft. Normal appearance. There is no tenderness.  Musculoskeletal: Normal range of motion.  No limitation of motion of the right shoulder. Neck has mildly limited lateral flexion to the right, secondary to pain. Right toe, examination deferred since the patient did not want to take his boot off.  Neurological: He is alert and oriented to person, place, and time. He has normal strength. No cranial nerve deficit or sensory deficit. He exhibits normal muscle tone. Coordination normal.  Skin: Skin is warm, dry and intact.  Psychiatric: He has a normal mood and affect. His behavior is normal. Judgment and thought content normal.    ED Course  Procedures (  including critical care time)      Date: 06/14/2012  Rate: 89  Rhythm: normal sinus rhythm  QRS Axis: right  PR and QT Intervals: normal  ST/T Wave abnormalities: normal  PR and QRS Conduction Disutrbances:none  Narrative Interpretation: LVH  Old EKG Reviewed: unchanged   Labs Reviewed  CBC - Abnormal; Notable for the following:    RDW 15.9 (*)    All other components within normal limits  BASIC METABOLIC PANEL - Abnormal; Notable for the following:    GFR calc non Af Amer 70 (*)    GFR calc Af Amer 81 (*)    All other  components within normal limits  POCT I-STAT TROPONIN I   Dg Chest 2 View  10/28/2012  *RADIOLOGY REPORT*  Clinical Data: Cough and congestion.  CHEST - 2 VIEW  Comparison: 04/26/2009  Findings: Stable asymmetric elevation of the right hemidiaphragm with scarring in the right costophrenic angle.  No focal airspace consolidation or pulmonary edema. The cardiopericardial silhouette is within normal limits for size. Imaged bony structures of the thorax are intact.  IMPRESSION: Stable.  Scarring at the peripheral right base.  No new or acute interval findings.   Original Report Authenticated By: Kennith Center, M.D.    Nursing notes, applicable records and vitals reviewed.  Radiologic Images/Reports reviewed.   1. Shoulder pain, right   2. Toe pain, right       MDM  Nonspecific musculoskeletal pain. Doubt spinal stenosis, fracture, septic arthritis. He is stable for discharge.   Plan: Home Medications- indomethicin; Home Treatments- rest/heat; Recommended follow up- PCP prn       Flint Melter, MD 10/29/12 [phone]
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
## 2012-10-28 NOTE — ED Notes (Signed)
Pt is here with right neck, shoulder and chest pain and reports right toe with gout pain

## 2014-08-10 ENCOUNTER — Encounter (HOSPITAL_COMMUNITY): Payer: Self-pay | Admitting: Emergency Medicine

## 2014-08-10 ENCOUNTER — Emergency Department (HOSPITAL_COMMUNITY)
Admission: EM | Admit: 2014-08-10 | Discharge: 2014-08-10 | Disposition: A | Discharge status: Home / Self Care | Payer: BC Managed Care – PPO | Source: Home / Self Care | Attending: Family Medicine | Admitting: Family Medicine

## 2014-08-10 DIAGNOSIS — R030 Elevated blood-pressure reading, without diagnosis of hypertension: Secondary | ICD-10-CM

## 2014-08-10 DIAGNOSIS — R519 Headache, unspecified: Secondary | ICD-10-CM

## 2014-08-10 DIAGNOSIS — R51 Headache: Secondary | ICD-10-CM

## 2014-08-10 DIAGNOSIS — IMO0001 Reserved for inherently not codable concepts without codable children: Secondary | ICD-10-CM

## 2014-08-10 LAB — POCT I-STAT, CHEM 8
BUN: 14 mg/dL (ref 6–23)
Calcium, Ion: 1.12 mmol/L (ref 1.12–1.23)
Chloride: 104 mEq/L (ref 96–112)
Creatinine, Ser: 1.3 mg/dL (ref 0.50–1.35)
Glucose, Bld: 90 mg/dL (ref 70–99)
HCT: 49 % (ref 39.0–52.0)
HEMOGLOBIN: 16.7 g/dL (ref 13.0–17.0)
Potassium: 4 mEq/L (ref 3.7–5.3)
SODIUM: 139 meq/L (ref 137–147)
TCO2: 22 mmol/L (ref 0–100)

## 2014-08-10 MED ORDER — METOCLOPRAMIDE HCL 5 MG/ML IJ SOLN
5.0000 mg | Freq: Once | INTRAMUSCULAR | Status: AC
  Administered 2014-08-10: 5 mg via INTRAMUSCULAR

## 2014-08-10 MED ORDER — KETOROLAC TROMETHAMINE 30 MG/ML IJ SOLN
INTRAMUSCULAR | Status: AC
  Filled 2014-08-10: qty 1

## 2014-08-10 MED ORDER — KETOROLAC TROMETHAMINE 60 MG/2ML IM SOLN
30.0000 mg | Freq: Once | INTRAMUSCULAR | Status: AC
  Administered 2014-08-10: 30 mg via INTRAMUSCULAR

## 2014-08-10 MED ORDER — DEXAMETHASONE SODIUM PHOSPHATE 10 MG/ML IJ SOLN
10.0000 mg | Freq: Once | INTRAMUSCULAR | Status: AC
  Administered 2014-08-10: 10 mg via INTRAMUSCULAR

## 2014-08-10 MED ORDER — METOCLOPRAMIDE HCL 5 MG/ML IJ SOLN
INTRAMUSCULAR | Status: AC
  Filled 2014-08-10: qty 2

## 2014-08-10 MED ORDER — DEXAMETHASONE SODIUM PHOSPHATE 10 MG/ML IJ SOLN
INTRAMUSCULAR | Status: AC
  Filled 2014-08-10: qty 1

## 2014-08-10 NOTE — ED Notes (Signed)
Pt states that he has had a chronic headache for 12 years that has worsened today.

## 2014-08-10 NOTE — Discharge Instructions (Signed)
Thank you for coming in today. Follow-up with her primary care provider Go to the emergency room if your headache becomes excruciating or you have weakness or numbness or uncontrolled vomiting.   Headaches, Frequently Asked Questions MIGRAINE HEADACHES Q: What is migraine? What causes it? How can I treat it? A: Generally, migraine headaches begin as a dull ache. Then they develop into a constant, throbbing, and pulsating pain. You may experience pain at the temples. You may experience pain at the front or back of one or both sides of the head. The pain is usually accompanied by a combination of:  Nausea.  Vomiting.  Sensitivity to light and noise. Some people (about 15%) experience an aura (see below) before an attack. The cause of migraine is believed to be chemical reactions in the brain. Treatment for migraine may include over-the-counter or prescription medications. It may also include self-help techniques. These include relaxation training and biofeedback.  Q: What is an aura? A: About 15% of people with migraine get an "aura". This is a sign of neurological symptoms that occur before a migraine headache. You may see wavy or jagged lines, dots, or flashing lights. You might experience tunnel vision or blind spots in one or both eyes. The aura can include visual or auditory hallucinations (something imagined). It may include disruptions in smell (such as strange odors), taste or touch. Other symptoms include:  Numbness.  A "pins and needles" sensation.  Difficulty in recalling or speaking the correct word. These neurological events may last as long as 60 minutes. These symptoms will fade as the headache begins. Q: What is a trigger? A: Certain physical or environmental factors can lead to or "trigger" a migraine. These include:  Foods.  Hormonal changes.  Weather.  Stress. It is important to remember that triggers are different for everyone. To help prevent migraine attacks, you  need to figure out which triggers affect you. Keep a headache diary. This is a good way to track triggers. The diary will help you talk to your healthcare professional about your condition. Q: Does weather affect migraines? A: Bright sunshine, hot, humid conditions, and drastic changes in barometric pressure may lead to, or "trigger," a migraine attack in some people. But studies have shown that weather does not act as a trigger for everyone with migraines. Q: What is the link between migraine and hormones? A: Hormones start and regulate many of your body's functions. Hormones keep your body in balance within a constantly changing environment. The levels of hormones in your body are unbalanced at times. Examples are during menstruation, pregnancy, or menopause. That can lead to a migraine attack. In fact, about three quarters of all women with migraine report that their attacks are related to the menstrual cycle.  Q: Is there an increased risk of stroke for migraine sufferers? A: The likelihood of a migraine attack causing a stroke is very remote. That is not to say that migraine sufferers cannot have a stroke associated with their migraines. In persons under age 45, the most common associated factor for stroke is migraine headache. But over the course of a person's normal life span, the occurrence of migraine headache may actually be associated with a reduced risk of dying from cerebrovascular disease due to stroke.  Q: What are acute medications for migraine? A: Acute medications are used to treat the pain of the headache after it has started. Examples over-the-counter medications, NSAIDs, ergots, and triptans.  Q: What are the triptans? A: Triptans are the  newest class of abortive medications. They are specifically targeted to treat migraine. Triptans are vasoconstrictors. They moderate some chemical reactions in the brain. The triptans work on receptors in your brain. Triptans help to restore the  balance of a neurotransmitter called serotonin. Fluctuations in levels of serotonin are thought to be a main cause of migraine.  Q: Are over-the-counter medications for migraine effective? A: Over-the-counter, or "OTC," medications may be effective in relieving mild to moderate pain and associated symptoms of migraine. But you should see your caregiver before beginning any treatment regimen for migraine.  Q: What are preventive medications for migraine? A: Preventive medications for migraine are sometimes referred to as "prophylactic" treatments. They are used to reduce the frequency, severity, and length of migraine attacks. Examples of preventive medications include antiepileptic medications, antidepressants, beta-blockers, calcium channel blockers, and NSAIDs (nonsteroidal anti-inflammatory drugs). Q: Why are anticonvulsants used to treat migraine? A: During the past few years, there has been an increased interest in antiepileptic drugs for the prevention of migraine. They are sometimes referred to as "anticonvulsants". Both epilepsy and migraine may be caused by similar reactions in the brain.  Q: Why are antidepressants used to treat migraine? A: Antidepressants are typically used to treat people with depression. They may reduce migraine frequency by regulating chemical levels, such as serotonin, in the brain.  Q: What alternative therapies are used to treat migraine? A: The term "alternative therapies" is often used to describe treatments considered outside the scope of conventional Western medicine. Examples of alternative therapy include acupuncture, acupressure, and yoga. Another common alternative treatment is herbal therapy. Some herbs are believed to relieve headache pain. Always discuss alternative therapies with your caregiver before proceeding. Some herbal products contain arsenic and other toxins. TENSION HEADACHES Q: What is a tension-type headache? What causes it? How can I treat  it? A: Tension-type headaches occur randomly. They are often the result of temporary stress, anxiety, fatigue, or anger. Symptoms include soreness in your temples, a tightening band-like sensation around your head (a "vice-like" ache). Symptoms can also include a pulling feeling, pressure sensations, and contracting head and neck muscles. The headache begins in your forehead, temples, or the back of your head and neck. Treatment for tension-type headache may include over-the-counter or prescription medications. Treatment may also include self-help techniques such as relaxation training and biofeedback. CLUSTER HEADACHES Q: What is a cluster headache? What causes it? How can I treat it? A: Cluster headache gets its name because the attacks come in groups. The pain arrives with little, if any, warning. It is usually on one side of the head. A tearing or bloodshot eye and a runny nose on the same side of the headache may also accompany the pain. Cluster headaches are believed to be caused by chemical reactions in the brain. They have been described as the most severe and intense of any headache type. Treatment for cluster headache includes prescription medication and oxygen. SINUS HEADACHES Q: What is a sinus headache? What causes it? How can I treat it? A: When a cavity in the bones of the face and skull (a sinus) becomes inflamed, the inflammation will cause localized pain. This condition is usually the result of an allergic reaction, a tumor, or an infection. If your headache is caused by a sinus blockage, such as an infection, you will probably have a fever. An x-ray will confirm a sinus blockage. Your caregiver's treatment might include antibiotics for the infection, as well as antihistamines or decongestants.  REBOUND HEADACHES  Q: What is a rebound headache? What causes it? How can I treat it? A: A pattern of taking acute headache medications too often can lead to a condition known as "rebound  headache." A pattern of taking too much headache medication includes taking it more than 2 days per week or in excessive amounts. That means more than the label or a caregiver advises. With rebound headaches, your medications not only stop relieving pain, they actually begin to cause headaches. Doctors treat rebound headache by tapering the medication that is being overused. Sometimes your caregiver will gradually substitute a different type of treatment or medication. Stopping may be a challenge. Regularly overusing a medication increases the potential for serious side effects. Consult a caregiver if you regularly use headache medications more than 2 days per week or more than the label advises. ADDITIONAL QUESTIONS AND ANSWERS Q: What is biofeedback? A: Biofeedback is a self-help treatment. Biofeedback uses special equipment to monitor your body's involuntary physical responses. Biofeedback monitors:  Breathing.  Pulse.  Heart rate.  Temperature.  Muscle tension.  Brain activity. Biofeedback helps you refine and perfect your relaxation exercises. You learn to control the physical responses that are related to stress. Once the technique has been mastered, you do not need the equipment any more. Q: Are headaches hereditary? A: Four out of five (80%) of people that suffer report a family history of migraine. Scientists are not sure if this is genetic or a family predisposition. Despite the uncertainty, a child has a 50% chance of having migraine if one parent suffers. The child has a 75% chance if both parents suffer.  Q: Can children get headaches? A: By the time they reach high school, most young people have experienced some type of headache. Many safe and effective approaches or medications can prevent a headache from occurring or stop it after it has begun.  Q: What type of doctor should I see to diagnose and treat my headache? A: Start with your primary caregiver. Discuss his or her  experience and approach to headaches. Discuss methods of classification, diagnosis, and treatment. Your caregiver may decide to recommend you to a headache specialist, depending upon your symptoms or other physical conditions. Having diabetes, allergies, etc., may require a more comprehensive and inclusive approach to your headache. The National Headache Foundation will provide, upon request, a list of [facility] physician members in your state. Document Released: 11/04/2003 Document Revised: 11/06/2011 Document Reviewed: 04/13/2008 [facility] Patient Information 2015 [place], [place]. This information is not intended to replace advice given to you by your health care provider. Make sure you discuss any questions you have with your health care provider.  PRIMARY CARE Paramedic at [place], [place] [phone]  Fax [phone]  Therapist, music at [facility] [address] [place], [place] [phone]  Fax [phone]  Therapist, music at [place] / [place] [phone] [address] [place], [place] [phone]  Fax [phone]  [facility] at [facility] [address], [place]  [name], [place] Ph [phone]  Fax [phone]  [place] [address] [place] [address] [name], [place] [phone]  Fax [phone]  [facility] at [facility] [place], [place] [phone]  Fax [phone]   [place] @ [place] [address] Phone: [place]  [facility] @ [facility] [facility] [place] [address] Phone: [place] @ [address] [place] [address] [place] [address] Phone: [place] @ [place]. [place] [address] Phone: [place] @ [place]. [employer], [place] [address] Phone: [phone]   [place] @ [place] [address] [place] [address] Phone: [phone]   Dr. Rachell Cipro [address] [place] [address] [phone]

## 2014-08-10 NOTE — ED Provider Notes (Signed)
Seth [name] is a 58 y.o. male who presents to Urgent Care today for headache. Patient has a severe 8 out of 10 headache starting yesterday evening. The pain is currently improving more at a 5 out of 10 now. He denies any weakness or numbness. His headache is worse than but consistent with previous headaches. He has had daily headaches for the last 12 years following a head injury. He denies any weakness or numbness bowel bladder dysfunction or difficulty walking. He has tried Tylenol which did not help much. He feels well otherwise.   Past Medical History  Diagnosis Date  . Gout    Past Surgical History  Procedure Laterality Date  . Cervical      cervical disc fusion   History  Substance Use Topics  . Smoking status: Current Every Day Smoker -- 1.00 packs/day    Types: Cigarettes  . Smokeless tobacco: Not on file  . Alcohol Use: Yes     Comment: weekend use only   ROS as above Medications: No current facility-administered medications for this encounter.   Current Outpatient Prescriptions  Medication Sig Dispense Refill  . indomethacin (INDOCIN) 50 MG capsule Take 1 capsule (50 mg total) by mouth 3 (three) times daily as needed (shoulder or toe pain). 90 capsule 0   No Known Allergies   Exam:  BP 153/96 mmHg  Pulse 78  Temp(Src) 98.2 F (36.8 C) (Oral)  Resp 16  SpO2 100% Gen: Well NAD HEENT: EOMI,  MMM PERRL.  Lungs: Normal work of breathing. CTABL Heart: RRR no MRG Abd: NABS, Soft. Nondistended, Nontender Exts: Brisk capillary refill, warm and well perfused.  Neuro: Alert and oriented cranial nerves II through XII are intact normal coordination strength sensation reflexes and gait. Normal balance.  Patient was given 30 mg IM Toradol, 10 mg IM dexamethasone, 5 mg IM Reglan injections and felt felt a little better  Results for orders placed or performed during the hospital encounter of 08/10/14 (from the past 24 hour(s))  I-STAT, chem 8     Status: None   Collection Time: 08/10/14  2:33 PM  Result Value Ref Range   Sodium 139 137 - 147 mEq/L   Potassium 4.0 3.7 - 5.3 mEq/L   Chloride 104 96 - 112 mEq/L   BUN 14 6 - 23 mg/dL   Creatinine, Ser 1.30 0.50 - 1.35 mg/dL   Glucose, Bld 90 70 - 99 mg/dL   Calcium, Ion 1.12 1.12 - 1.23 mmol/L   TCO2 22 0 - 100 mmol/L   Hemoglobin 16.7 13.0 - 17.0 g/dL   HCT 49.0 39.0 - 52.0 %   No results found.  Assessment and Plan: 58 y.o. male with acute on chronic headache. Treat with medications as above. Take Tylenol. Follow-up with PCP for headache and elevated blood pressure.  Discussed warning signs or symptoms. Please see discharge instructions. Patient expresses understanding.     Gregor Hams, MD 08/10/14 [phone]

## 2016-06-03 ENCOUNTER — Encounter (HOSPITAL_COMMUNITY): Payer: Self-pay

## 2016-06-03 ENCOUNTER — Emergency Department (HOSPITAL_COMMUNITY): Payer: BLUE CROSS/BLUE SHIELD

## 2016-06-03 ENCOUNTER — Emergency Department (HOSPITAL_COMMUNITY)
Admission: EM | Admit: 2016-06-03 | Discharge: 2016-06-03 | Disposition: A | Discharge status: Home / Self Care | Payer: BLUE CROSS/BLUE SHIELD | Attending: Emergency Medicine | Admitting: Emergency Medicine

## 2016-06-03 DIAGNOSIS — R0781 Pleurodynia: Secondary | ICD-10-CM | POA: Insufficient documentation

## 2016-06-03 DIAGNOSIS — Y92410 Unspecified street and highway as the place of occurrence of the external cause: Secondary | ICD-10-CM | POA: Diagnosis not present

## 2016-06-03 DIAGNOSIS — Y939 Activity, unspecified: Secondary | ICD-10-CM | POA: Diagnosis not present

## 2016-06-03 DIAGNOSIS — Y999 Unspecified external cause status: Secondary | ICD-10-CM | POA: Insufficient documentation

## 2016-06-03 DIAGNOSIS — F17210 Nicotine dependence, cigarettes, uncomplicated: Secondary | ICD-10-CM | POA: Insufficient documentation

## 2016-06-03 DIAGNOSIS — M549 Dorsalgia, unspecified: Secondary | ICD-10-CM | POA: Diagnosis not present

## 2016-06-03 LAB — CBC
HCT: 41.7 % (ref 39.0–52.0)
Hemoglobin: 14.3 g/dL (ref 13.0–17.0)
MCH: 32.7 pg (ref 26.0–34.0)
MCHC: 34.3 g/dL (ref 30.0–36.0)
MCV: 95.4 fL (ref 78.0–100.0)
PLATELETS: 235 10*3/uL (ref 150–400)
RBC: 4.37 MIL/uL (ref 4.22–5.81)
RDW: 15.5 % (ref 11.5–15.5)
WBC: 4.8 10*3/uL (ref 4.0–10.5)

## 2016-06-03 LAB — BASIC METABOLIC PANEL
Anion gap: 12 (ref 5–15)
BUN: 16 mg/dL (ref 6–20)
CHLORIDE: 107 mmol/L (ref 101–111)
CO2: 19 mmol/L — AB (ref 22–32)
CREATININE: 0.93 mg/dL (ref 0.61–1.24)
Calcium: 9.2 mg/dL (ref 8.9–10.3)
GFR calc Af Amer: >60 mL/min (ref >60)
GFR calc non Af Amer: >60 mL/min (ref >60)
Glucose, Bld: 114 mg/dL — ABNORMAL HIGH (ref 65–99)
Potassium: 3.8 mmol/L (ref 3.5–5.1)
Sodium: 138 mmol/L (ref 135–145)

## 2016-06-03 LAB — I-STAT TROPONIN, ED: Troponin i, poc: 0.01 ng/mL (ref 0.00–0.08)
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
## 2016-06-03 MED ORDER — OXYCODONE-ACETAMINOPHEN 5-325 MG PO TABS
1.0000 | ORAL_TABLET | Freq: Once | ORAL | Status: AC
  Administered 2016-06-03: 1 via ORAL
  Filled 2016-06-03: qty 1

## 2016-06-03 MED ORDER — IBUPROFEN 600 MG PO TABS: 600.0000 mg | ORAL_TABLET | Freq: Four times a day (QID) | ORAL | 0 refills | Status: DC | PRN

## 2016-06-03 MED ORDER — METHOCARBAMOL 500 MG PO TABS: 500.0000 mg | ORAL_TABLET | Freq: Two times a day (BID) | ORAL | 0 refills | Status: DC

## 2016-06-03 MED ORDER — HYDROCODONE-ACETAMINOPHEN 5-325 MG PO TABS: 1.0000 | ORAL_TABLET | ORAL | 0 refills | Status: DC | PRN
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
## 2016-06-03 NOTE — Discharge Instructions (Signed)
Take your medications as prescribed as needed for pain relief. I also recommend applying ice to affected area for 15 minutes 3-4 times daily. Please follow up with a primary care provider from the Resource Guide provided below in one week if her symptoms have not improved. Please return to the Emergency Department if symptoms worsen or new onset of fever, headache, neck pain, back pain, wrist pain, difficulty breathing, abdominal pain, loss of control of bowel or bladder, numbness, tingling, weakness.
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
## 2016-06-21 ENCOUNTER — Encounter (HOSPITAL_COMMUNITY): Payer: Self-pay

## 2016-06-21 ENCOUNTER — Emergency Department (HOSPITAL_COMMUNITY)
Admission: EM | Admit: 2016-06-21 | Discharge: 2016-06-21 | Disposition: A | Discharge status: Home / Self Care | Payer: No Typology Code available for payment source | Attending: Emergency Medicine | Admitting: Emergency Medicine

## 2016-06-21 ENCOUNTER — Emergency Department (HOSPITAL_COMMUNITY): Payer: No Typology Code available for payment source

## 2016-06-21 DIAGNOSIS — F17210 Nicotine dependence, cigarettes, uncomplicated: Secondary | ICD-10-CM | POA: Insufficient documentation

## 2016-06-21 DIAGNOSIS — S20219A Contusion of unspecified front wall of thorax, initial encounter: Secondary | ICD-10-CM | POA: Insufficient documentation

## 2016-06-21 DIAGNOSIS — Y92410 Unspecified street and highway as the place of occurrence of the external cause: Secondary | ICD-10-CM | POA: Diagnosis not present

## 2016-06-21 DIAGNOSIS — Y999 Unspecified external cause status: Secondary | ICD-10-CM | POA: Insufficient documentation

## 2016-06-21 DIAGNOSIS — Y939 Activity, unspecified: Secondary | ICD-10-CM | POA: Insufficient documentation

## 2016-06-21 DIAGNOSIS — R079 Chest pain, unspecified: Secondary | ICD-10-CM

## 2016-06-21 DIAGNOSIS — S299XXA Unspecified injury of thorax, initial encounter: Secondary | ICD-10-CM | POA: Diagnosis present
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
## 2016-06-21 MED ORDER — MELOXICAM 15 MG PO TABS: 15.0000 mg | ORAL_TABLET | Freq: Every day | ORAL | 0 refills | Status: DC

## 2016-06-21 MED ORDER — BENZONATATE 100 MG PO CAPS: 100.0000 mg | ORAL_CAPSULE | Freq: Three times a day (TID) | ORAL | 0 refills | Status: DC
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
## 2016-06-21 NOTE — Discharge Instructions (Signed)
Take mobic as prescribed daily for pain and congestion. Take tessalon for cough suppression. Follow up for recheck with primary care doctor.
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
## 2016-06-21 NOTE — ED Notes (Addendum)
Pt is in stable condition upon d/c and ambulates from ED. Pt given [facility] coupon 158

## 2016-07-13 ENCOUNTER — Emergency Department (HOSPITAL_COMMUNITY): Payer: BLUE CROSS/BLUE SHIELD

## 2016-07-13 ENCOUNTER — Emergency Department (HOSPITAL_COMMUNITY)
Admission: EM | Admit: 2016-07-13 | Discharge: 2016-07-13 | Disposition: A | Discharge status: Home / Self Care | Payer: BLUE CROSS/BLUE SHIELD | Attending: Emergency Medicine | Admitting: Emergency Medicine

## 2016-07-13 ENCOUNTER — Encounter (HOSPITAL_COMMUNITY): Payer: Self-pay | Admitting: Emergency Medicine

## 2016-07-13 DIAGNOSIS — F17210 Nicotine dependence, cigarettes, uncomplicated: Secondary | ICD-10-CM | POA: Insufficient documentation

## 2016-07-13 DIAGNOSIS — S2222XD Fracture of body of sternum, subsequent encounter for fracture with routine healing: Secondary | ICD-10-CM | POA: Insufficient documentation

## 2016-07-13 DIAGNOSIS — S299XXD Unspecified injury of thorax, subsequent encounter: Secondary | ICD-10-CM | POA: Diagnosis present

## 2016-07-13 LAB — CBC
HCT: 40.6 % (ref 39.0–52.0)
Hemoglobin: 13.6 g/dL (ref 13.0–17.0)
MCH: 32.3 pg (ref 26.0–34.0)
MCHC: 33.5 g/dL (ref 30.0–36.0)
MCV: 96.4 fL (ref 78.0–100.0)
PLATELETS: 222 10*3/uL (ref 150–400)
RBC: 4.21 MIL/uL — ABNORMAL LOW (ref 4.22–5.81)
RDW: 16.1 % — AB (ref 11.5–15.5)
WBC: 4.5 10*3/uL (ref 4.0–10.5)

## 2016-07-13 LAB — BASIC METABOLIC PANEL
Anion gap: 11 (ref 5–15)
BUN: 12 mg/dL (ref 6–20)
CALCIUM: 9.2 mg/dL (ref 8.9–10.3)
CO2: 22 mmol/L (ref 22–32)
CREATININE: 1.01 mg/dL (ref 0.61–1.24)
Chloride: 105 mmol/L (ref 101–111)
GFR calc Af Amer: >60 mL/min (ref >60)
Glucose, Bld: 114 mg/dL — ABNORMAL HIGH (ref 65–99)
Potassium: 4 mmol/L (ref 3.5–5.1)
SODIUM: 138 mmol/L (ref 135–145)

## 2016-07-13 LAB — I-STAT TROPONIN, ED: Troponin i, poc: 0 ng/mL (ref 0.00–0.08)
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
## 2016-07-13 MED ORDER — NAPROXEN 500 MG PO TABS: 500.0000 mg | ORAL_TABLET | Freq: Two times a day (BID) | ORAL | 0 refills | Status: DC

## 2016-07-13 MED ORDER — ACETAMINOPHEN 500 MG PO TABS: 1000.0000 mg | ORAL_TABLET | Freq: Four times a day (QID) | ORAL | 0 refills | Status: AC

## 2016-07-13 MED ORDER — OXYCODONE HCL 5 MG PO TABS: 5.0000 mg | ORAL_TABLET | Freq: Four times a day (QID) | ORAL | 0 refills | Status: DC | PRN

## 2016-07-13 NOTE — ED Notes (Signed)
ED Provider at bedside. 
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
## 2016-07-24 ENCOUNTER — Ambulatory Visit (HOSPITAL_COMMUNITY)
Admission: EM | Admit: 2016-07-24 | Discharge: 2016-07-24 | Disposition: A | Discharge status: Home / Self Care | Payer: BLUE CROSS/BLUE SHIELD | Attending: Emergency Medicine | Admitting: Emergency Medicine

## 2016-07-24 ENCOUNTER — Encounter (HOSPITAL_COMMUNITY): Payer: Self-pay | Admitting: *Deleted

## 2016-07-24 ENCOUNTER — Ambulatory Visit (INDEPENDENT_AMBULATORY_CARE_PROVIDER_SITE_OTHER): Payer: BLUE CROSS/BLUE SHIELD

## 2016-07-24 DIAGNOSIS — J069 Acute upper respiratory infection, unspecified: Secondary | ICD-10-CM

## 2016-07-24 DIAGNOSIS — B9789 Other viral agents as the cause of diseases classified elsewhere: Secondary | ICD-10-CM
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
## 2016-07-24 MED ORDER — ALBUTEROL SULFATE HFA 108 (90 BASE) MCG/ACT IN AERS: 2.0000 | INHALATION_SPRAY | RESPIRATORY_TRACT | 0 refills | Status: DC | PRN

## 2016-07-24 MED ORDER — HYDROCODONE-HOMATROPINE 5-1.5 MG/5ML PO SYRP: 5.0000 mL | ORAL_SOLUTION | Freq: Four times a day (QID) | ORAL | 0 refills | Status: DC | PRN

## 2016-07-24 NOTE — Discharge Instructions (Signed)
You have an upper respiratory infection. This is caused by a virus. Use the cough syrup every 6 hours as needed. Do not drive while taking this medicine. Do not take the pain pill if you have taken the cough syrup. Use albuterol inhaler every 4 hours as needed for wheezing. This should improve over the next few days. If you develop fevers, worsening shortness of breath, or bloody sputum, please come back.
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
## 2017-08-23 ENCOUNTER — Encounter (HOSPITAL_COMMUNITY): Payer: Self-pay | Admitting: *Deleted

## 2017-08-23 ENCOUNTER — Emergency Department (HOSPITAL_COMMUNITY): Payer: Self-pay

## 2017-08-23 ENCOUNTER — Inpatient Hospital Stay (HOSPITAL_COMMUNITY): Payer: Self-pay

## 2017-08-23 ENCOUNTER — Other Ambulatory Visit: Payer: Self-pay

## 2017-08-23 ENCOUNTER — Inpatient Hospital Stay (HOSPITAL_COMMUNITY)
Admission: EM | Admit: 2017-08-23 | Discharge: 2017-08-26 | DRG: 190 | Disposition: A | Discharge status: Home / Self Care | Payer: Self-pay | Attending: Family Medicine | Admitting: Family Medicine

## 2017-08-23 DIAGNOSIS — R0789 Other chest pain: Secondary | ICD-10-CM | POA: Diagnosis present

## 2017-08-23 DIAGNOSIS — M545 Low back pain: Secondary | ICD-10-CM | POA: Diagnosis present

## 2017-08-23 DIAGNOSIS — R197 Diarrhea, unspecified: Secondary | ICD-10-CM | POA: Diagnosis present

## 2017-08-23 DIAGNOSIS — J449 Chronic obstructive pulmonary disease, unspecified: Secondary | ICD-10-CM

## 2017-08-23 DIAGNOSIS — R079 Chest pain, unspecified: Secondary | ICD-10-CM | POA: Diagnosis present

## 2017-08-23 DIAGNOSIS — F1910 Other psychoactive substance abuse, uncomplicated: Secondary | ICD-10-CM | POA: Diagnosis present

## 2017-08-23 DIAGNOSIS — R000 Tachycardia, unspecified: Secondary | ICD-10-CM | POA: Diagnosis present

## 2017-08-23 DIAGNOSIS — J189 Pneumonia, unspecified organism: Secondary | ICD-10-CM

## 2017-08-23 DIAGNOSIS — E440 Moderate protein-calorie malnutrition: Secondary | ICD-10-CM

## 2017-08-23 DIAGNOSIS — R0603 Acute respiratory distress: Secondary | ICD-10-CM

## 2017-08-23 DIAGNOSIS — Z720 Tobacco use: Secondary | ICD-10-CM | POA: Diagnosis present

## 2017-08-23 DIAGNOSIS — Z681 Body mass index (BMI) 19 or less, adult: Secondary | ICD-10-CM

## 2017-08-23 DIAGNOSIS — F17210 Nicotine dependence, cigarettes, uncomplicated: Secondary | ICD-10-CM | POA: Diagnosis present

## 2017-08-23 DIAGNOSIS — F17200 Nicotine dependence, unspecified, uncomplicated: Secondary | ICD-10-CM | POA: Diagnosis present

## 2017-08-23 DIAGNOSIS — Z8249 Family history of ischemic heart disease and other diseases of the circulatory system: Secondary | ICD-10-CM

## 2017-08-23 DIAGNOSIS — J441 Chronic obstructive pulmonary disease with (acute) exacerbation: Principal | ICD-10-CM | POA: Diagnosis present

## 2017-08-23 DIAGNOSIS — R030 Elevated blood-pressure reading, without diagnosis of hypertension: Secondary | ICD-10-CM | POA: Diagnosis present

## 2017-08-23 DIAGNOSIS — Z23 Encounter for immunization: Secondary | ICD-10-CM

## 2017-08-23 DIAGNOSIS — J9601 Acute respiratory failure with hypoxia: Secondary | ICD-10-CM | POA: Diagnosis present

## 2017-08-23 DIAGNOSIS — F1010 Alcohol abuse, uncomplicated: Secondary | ICD-10-CM | POA: Diagnosis present

## 2017-08-23 DIAGNOSIS — E872 Acidosis: Secondary | ICD-10-CM | POA: Diagnosis present

## 2017-08-23 DIAGNOSIS — Z981 Arthrodesis status: Secondary | ICD-10-CM

## 2017-08-23 LAB — I-STAT ARTERIAL BLOOD GAS, ED
ACID-BASE DEFICIT: 8 mmol/L — AB (ref 0.0–2.0)
Bicarbonate: 16 mmol/L — ABNORMAL LOW (ref 20.0–28.0)
O2 Saturation: 97 %
PH ART: 7.365 (ref 7.350–7.450)
TCO2: 17 mmol/L — ABNORMAL LOW (ref 22–32)
pCO2 arterial: 27.9 mmHg — ABNORMAL LOW (ref 32.0–48.0)
pO2, Arterial: 92 mmHg (ref 83.0–108.0)

## 2017-08-23 LAB — URINALYSIS, ROUTINE W REFLEX MICROSCOPIC
Bacteria, UA: NONE SEEN
Bilirubin Urine: NEGATIVE
GLUCOSE, UA: NEGATIVE mg/dL
KETONES UR: NEGATIVE mg/dL
LEUKOCYTES UA: NEGATIVE
Nitrite: NEGATIVE
PH: 6 (ref 5.0–8.0)
Protein, ur: NEGATIVE mg/dL
SQUAMOUS EPITHELIAL / LPF: NONE SEEN
Specific Gravity, Urine: 1.004 — ABNORMAL LOW (ref 1.005–1.030)

## 2017-08-23 LAB — CBC
HEMATOCRIT: 37.5 % — AB (ref 39.0–52.0)
HEMOGLOBIN: 12.4 g/dL — AB (ref 13.0–17.0)
MCH: 32.5 pg (ref 26.0–34.0)
MCHC: 33.1 g/dL (ref 30.0–36.0)
MCV: 98.4 fL (ref 78.0–100.0)
PLATELETS: 180 10*3/uL (ref 150–400)
RBC: 3.81 MIL/uL — AB (ref 4.22–5.81)
RDW: 17.4 % — AB (ref 11.5–15.5)
WBC: 6.4 10*3/uL (ref 4.0–10.5)

## 2017-08-23 LAB — LACTIC ACID, PLASMA
LACTIC ACID, VENOUS: 5.1 mmol/L — AB (ref 0.5–1.9)
Lactic Acid, Venous: 5.4 mmol/L (ref 0.5–1.9)
Lactic Acid, Venous: 5.6 mmol/L (ref 0.5–1.9)

## 2017-08-23 LAB — I-STAT CG4 LACTIC ACID, ED
LACTIC ACID, VENOUS: 4.75 mmol/L — AB (ref 0.5–1.9)
Lactic Acid, Venous: 8.05 mmol/L (ref 0.5–1.9)

## 2017-08-23 LAB — BASIC METABOLIC PANEL
ANION GAP: 16 — AB (ref 5–15)
BUN: 10 mg/dL (ref 6–20)
CALCIUM: 9.8 mg/dL (ref 8.9–10.3)
CO2: 20 mmol/L — ABNORMAL LOW (ref 22–32)
Chloride: 99 mmol/L — ABNORMAL LOW (ref 101–111)
Creatinine, Ser: 1.07 mg/dL (ref 0.61–1.24)
GFR calc non Af Amer: >60 mL/min (ref >60)
Glucose, Bld: 108 mg/dL — ABNORMAL HIGH (ref 65–99)
Potassium: 4.5 mmol/L (ref 3.5–5.1)
Sodium: 135 mmol/L (ref 135–145)

## 2017-08-23 LAB — HEPATIC FUNCTION PANEL
ALBUMIN: 4.4 g/dL (ref 3.5–5.0)
ALT: 14 U/L — ABNORMAL LOW (ref 17–63)
AST: 34 U/L (ref 15–41)
Alkaline Phosphatase: 74 U/L (ref 38–126)
BILIRUBIN TOTAL: 0.7 mg/dL (ref 0.3–1.2)
Bilirubin, Direct: <0.1 mg/dL — ABNORMAL LOW (ref 0.1–0.5)
Total Protein: 8 g/dL (ref 6.5–8.1)

## 2017-08-23 LAB — PROCALCITONIN: PROCALCITONIN: 0.16 ng/mL

## 2017-08-23 LAB — INFLUENZA PANEL BY PCR (TYPE A & B)
INFLBPCR: NEGATIVE
Influenza A By PCR: NEGATIVE

## 2017-08-23 LAB — I-STAT TROPONIN, ED: TROPONIN I, POC: 0 ng/mL (ref 0.00–0.08)

## 2017-08-23 LAB — BRAIN NATRIURETIC PEPTIDE: B Natriuretic Peptide: 31.4 pg/mL (ref 0.0–100.0)

## 2017-08-23 LAB — TROPONIN I

## 2017-08-23 IMAGING — CT CT ANGIO CHEST
2 of 8 series · 19 of 46 positions shown · IV contrast (iopamidol)
Comparison: None

CLINICAL DATA: Chest pain, shortness of breath and weakness for
several weeks, increased symptoms, now cannot walk from one room to
the next without chest pain and significant shortness of breath,
smoker

EXAM:
CT ANGIOGRAPHY CHEST WITH CONTRAST
TECHNIQUE: Multidetector CT imaging of the chest was performed using the
standard protocol during bolus administration of intravenous
contrast. Multiplanar CT image reconstructions and MIPs were
obtained to evaluate the vascular anatomy.
CONTRAST:  100mL [LI] IOPAMIDOL ([LI]) INJECTION 76% IV

[Series 6: thins · axial · 0.73mm/px · z∈[+19,+325]mm · 16 of 338 slices shown]
[im 16/338  lung]
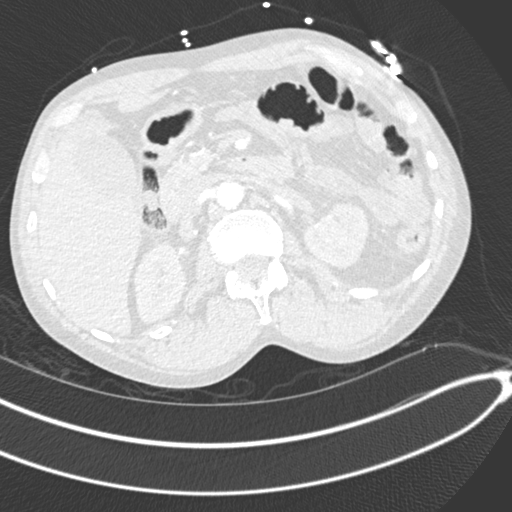
[im 31/338  soft-tissue]
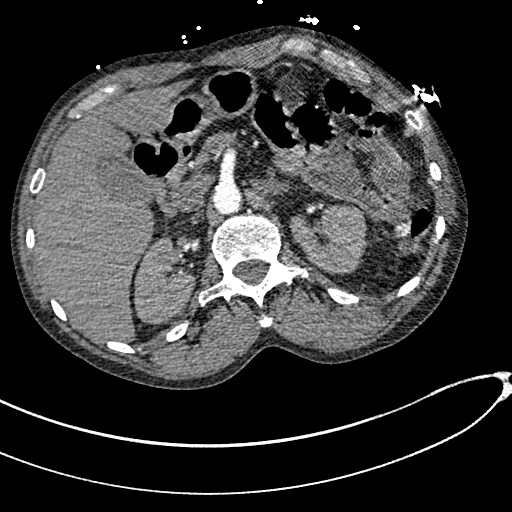
[im 62/338  lung]
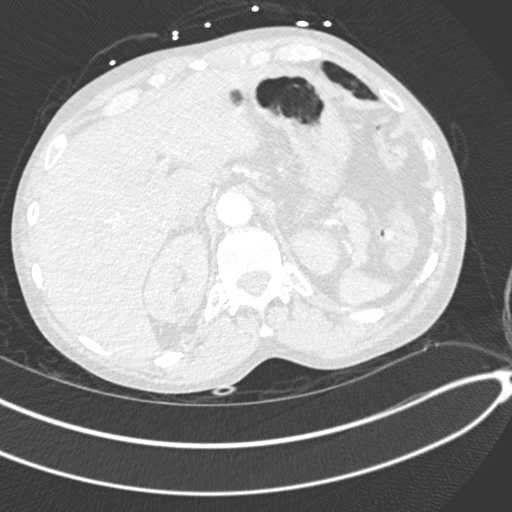
[im 77/338  soft-tissue]
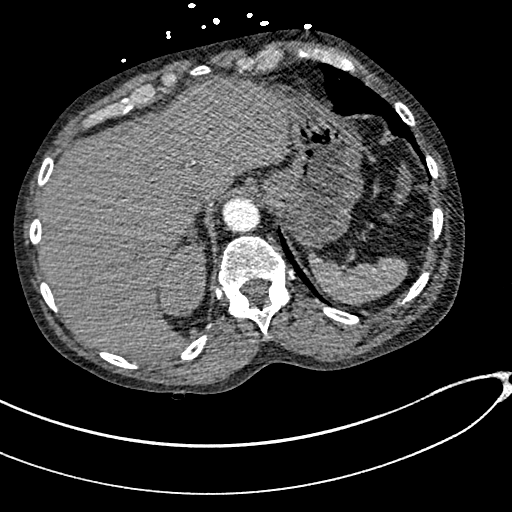
[im 92/338  lung]
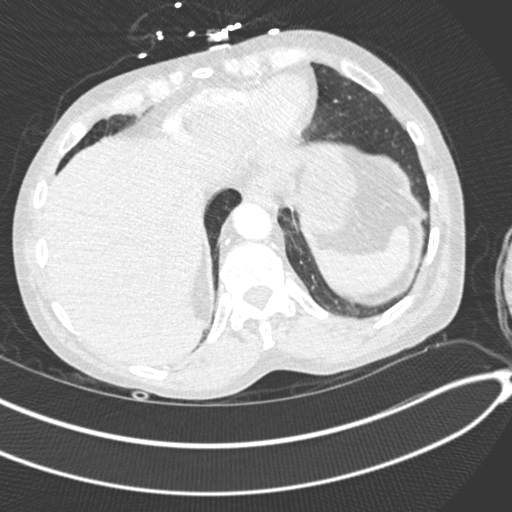
[im 123/338  soft-tissue]
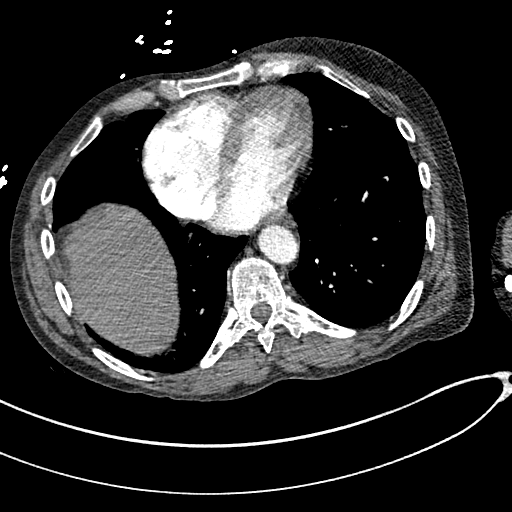
[im 138/338  lung]
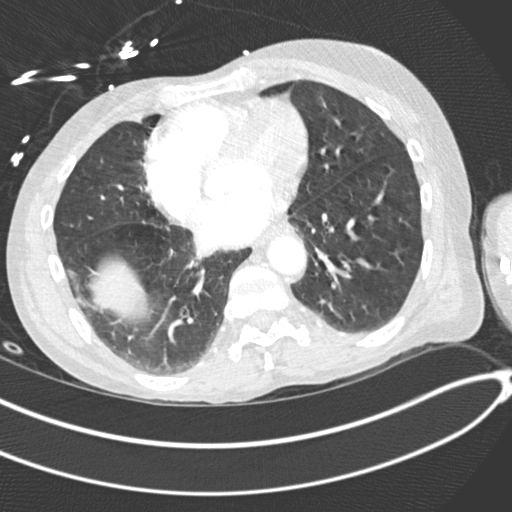
[im 154/338  soft-tissue]
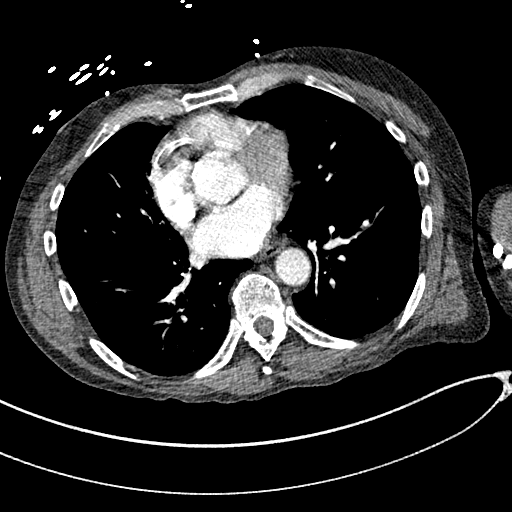
[im 184/338  lung]
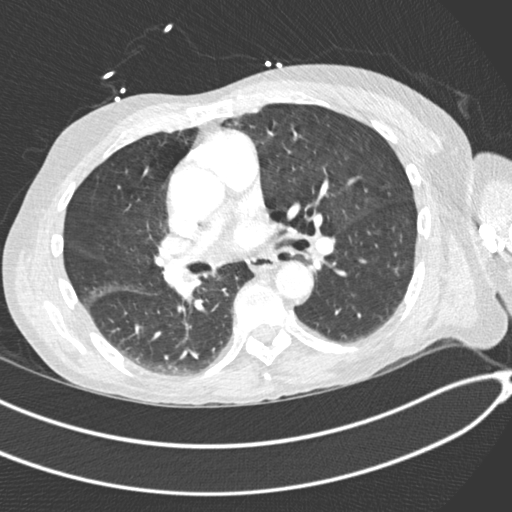
[im 200/338  soft-tissue]
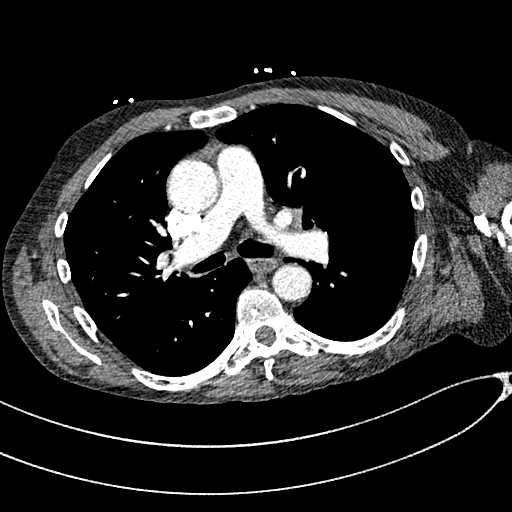
[im 215/338  lung]
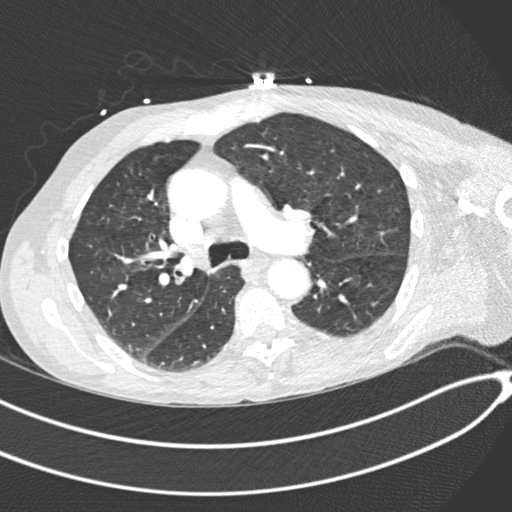
[im 246/338  soft-tissue]
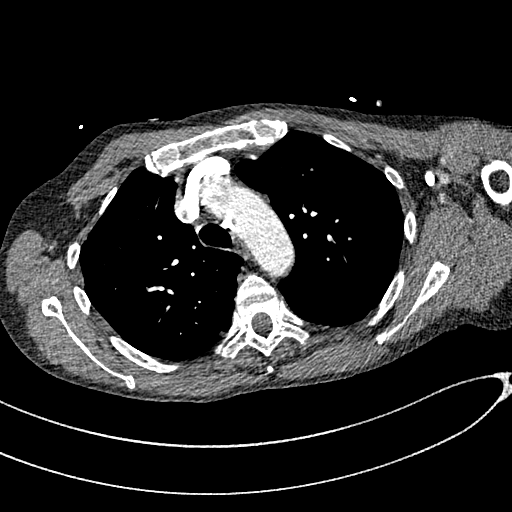
[im 261/338  lung]
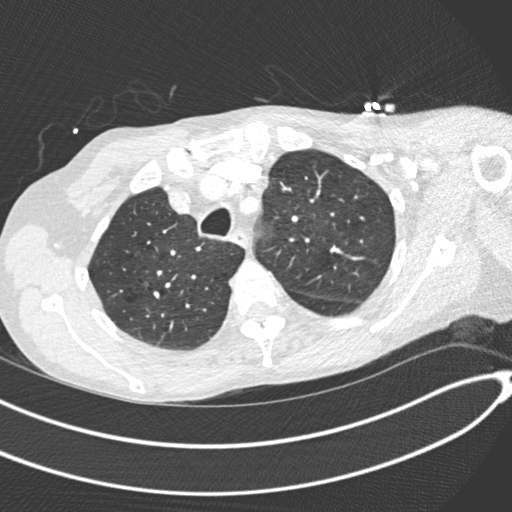
[im 276/338  soft-tissue]
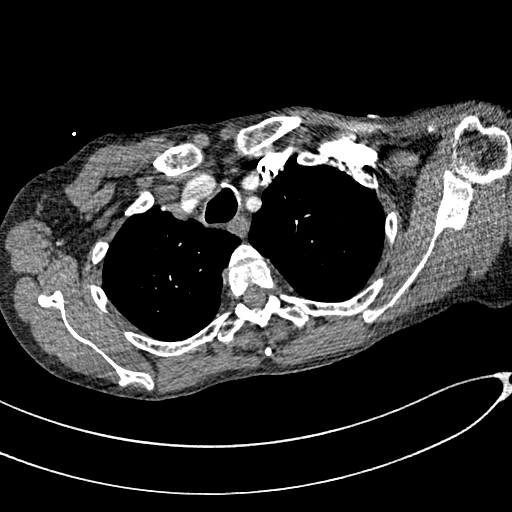
[im 307/338  lung]
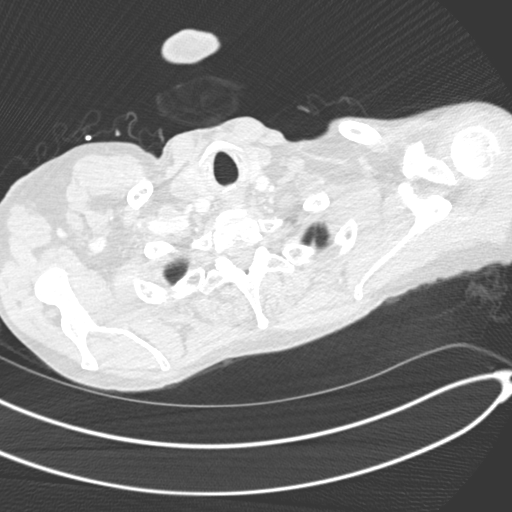
[im 322/338  soft-tissue]
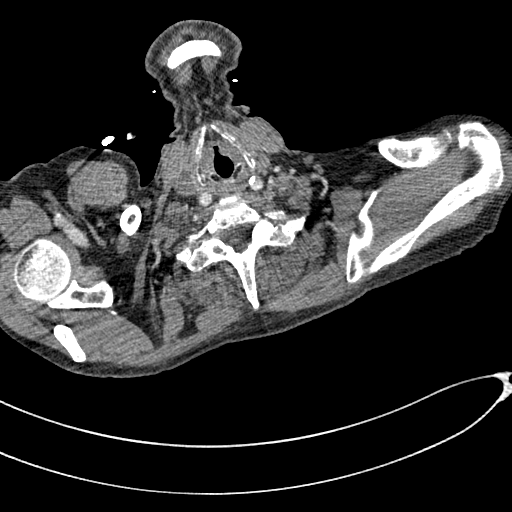

[Series 8: coronal mpr · coronal · 0.66mm/px · 3 of 150 slices shown]
[im 38/150  soft-tissue]
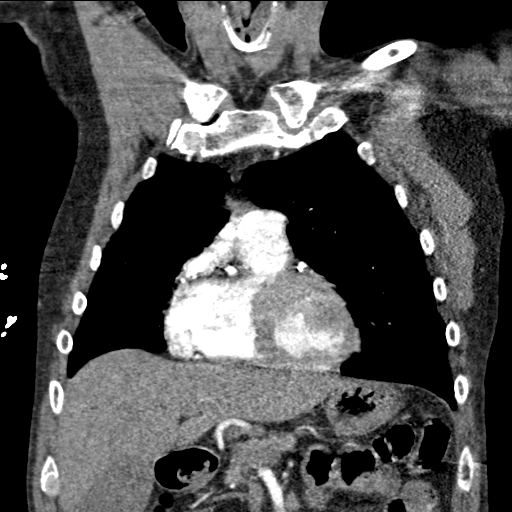
[im 75/150  soft-tissue]
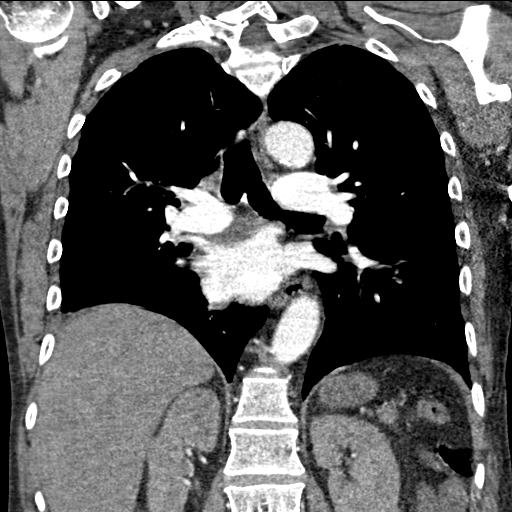
[im 112/150  soft-tissue]
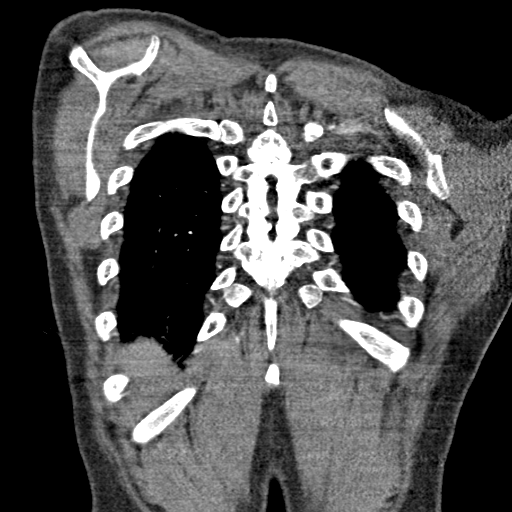

[19 of 46 positions shown; findings below may reference images not displayed]

FINDINGS: Cardiovascular: Atherosclerotic calcifications aorta, coronary
arteries and proximal great vessels. Aorta dilated 4.1 cm diameter
at distal ascending aorta. No evidence of dissection. Pulmonary
artery is adequately opacified and patent. No evidence of pulmonary
embolism. No pericardial effusion.

Mediastinum/Nodes: Esophagus normal appearance. Base of cervical
region normal appearance. No thoracic adenopathy.

Lungs/Pleura: Nodular density subpleural at posteromedial RIGHT
upper lobe 6 mm diameter image 31 diameter stable in size but now
abutting pleural surface. Dependent atelectasis in the posterior
lower lobes. Mild underlying emphysematous changes. No acute
infiltrate, pleural effusion or pneumothorax.

Upper Abdomen: Small cyst at posterior aspect of upper pole RIGHT
kidney unchanged. Diverticulosis of proximal descending colon.
Remaining visualized upper abdomen unremarkable.

Musculoskeletal: No acute osseous findings.

Review of the MIP images confirms the above findings.
IMPRESSION: No evidence pulmonary embolism.

Stable 6 mm RIGHT upper lobe nodule.

Mild emphysematous changes without infiltrate.

Aortic Atherosclerosis ([LI]-[LI]).

Emphysema ([LI]-[LI]).

## 2017-08-23 IMAGING — CR DG CHEST 2V
2 series · 2 of 2 positions shown · non-contrast
Comparison: Of of [DATE]

CLINICAL DATA: Shortness of breath, chest pain, and cough.

EXAM:
CHEST  2 VIEW

[chest pa]
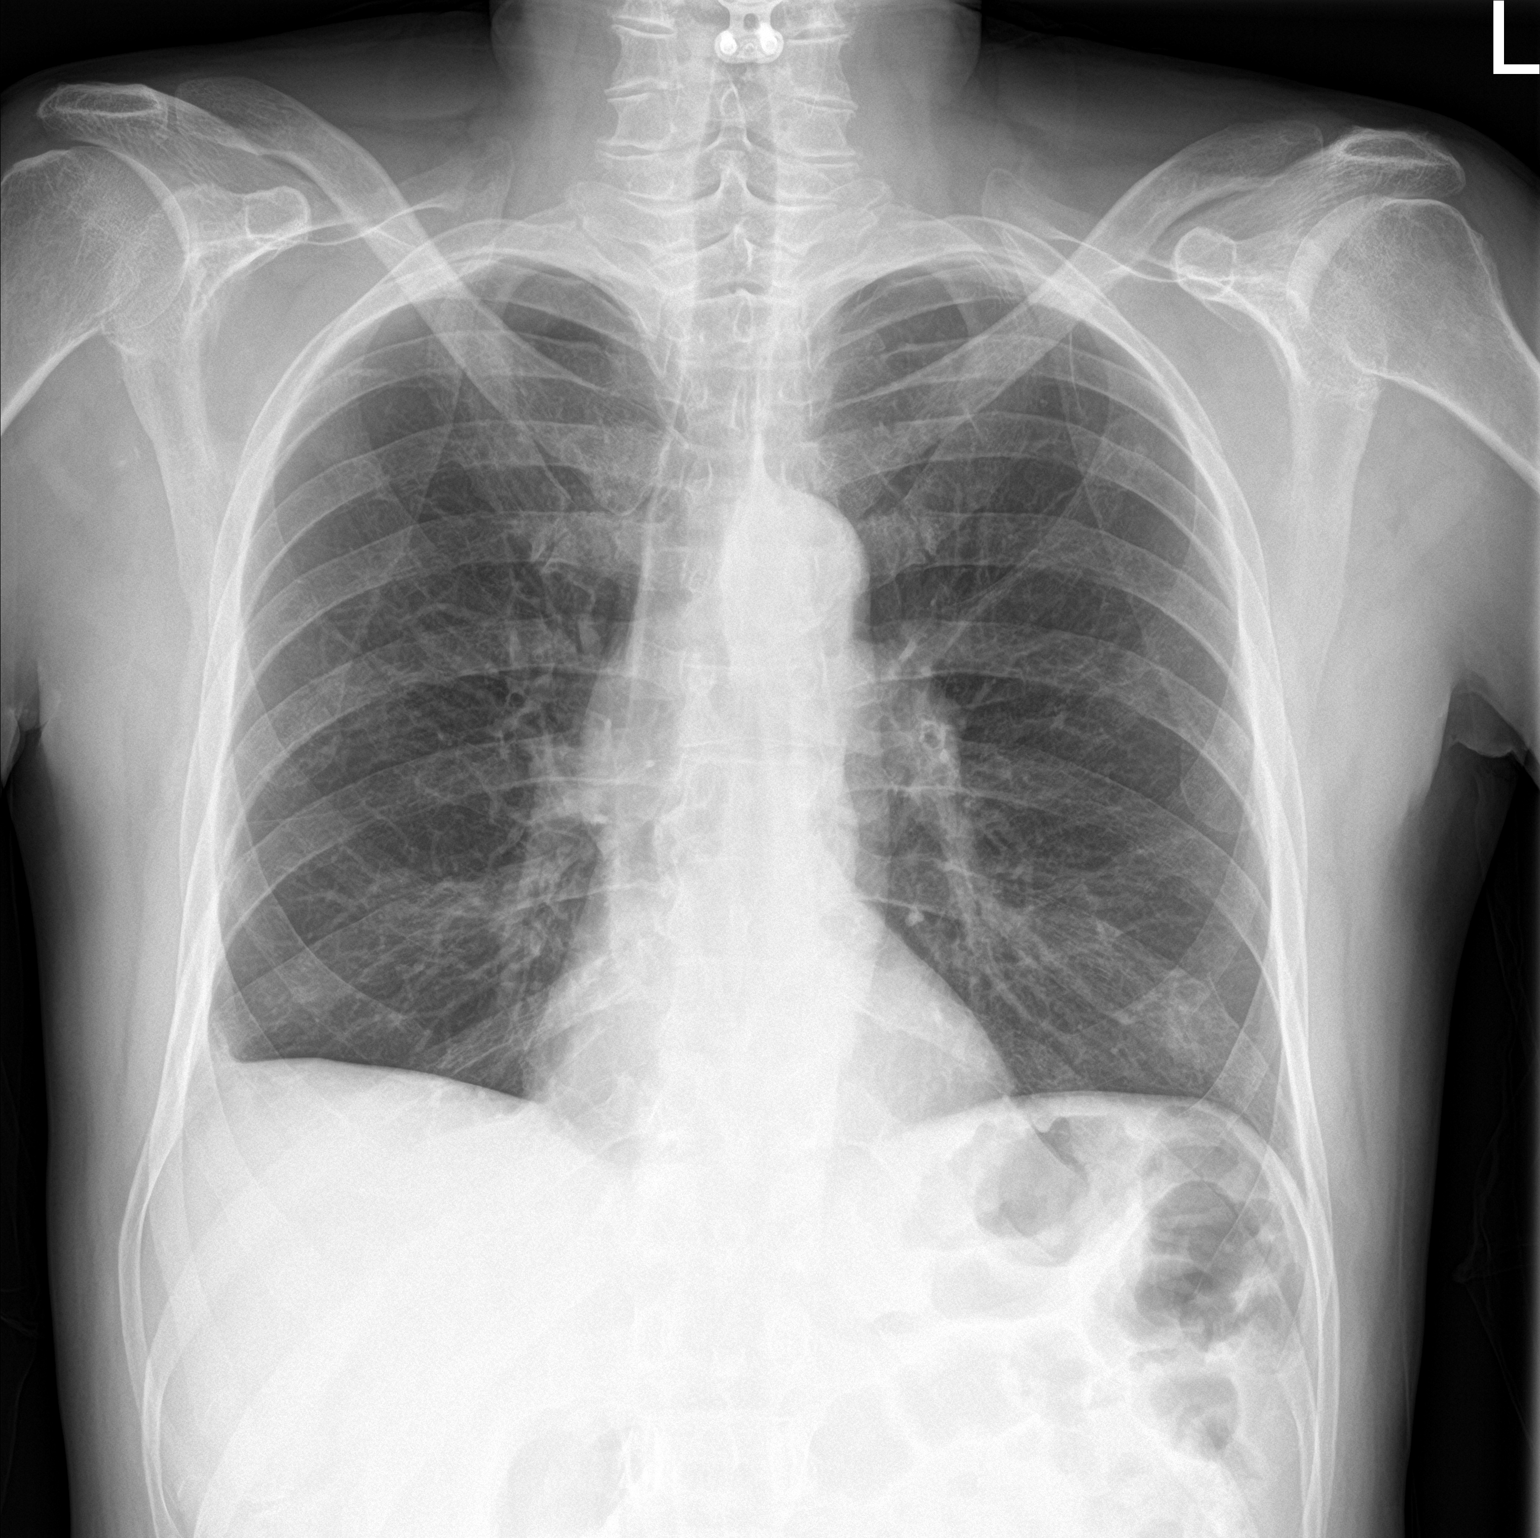

[chest lat]
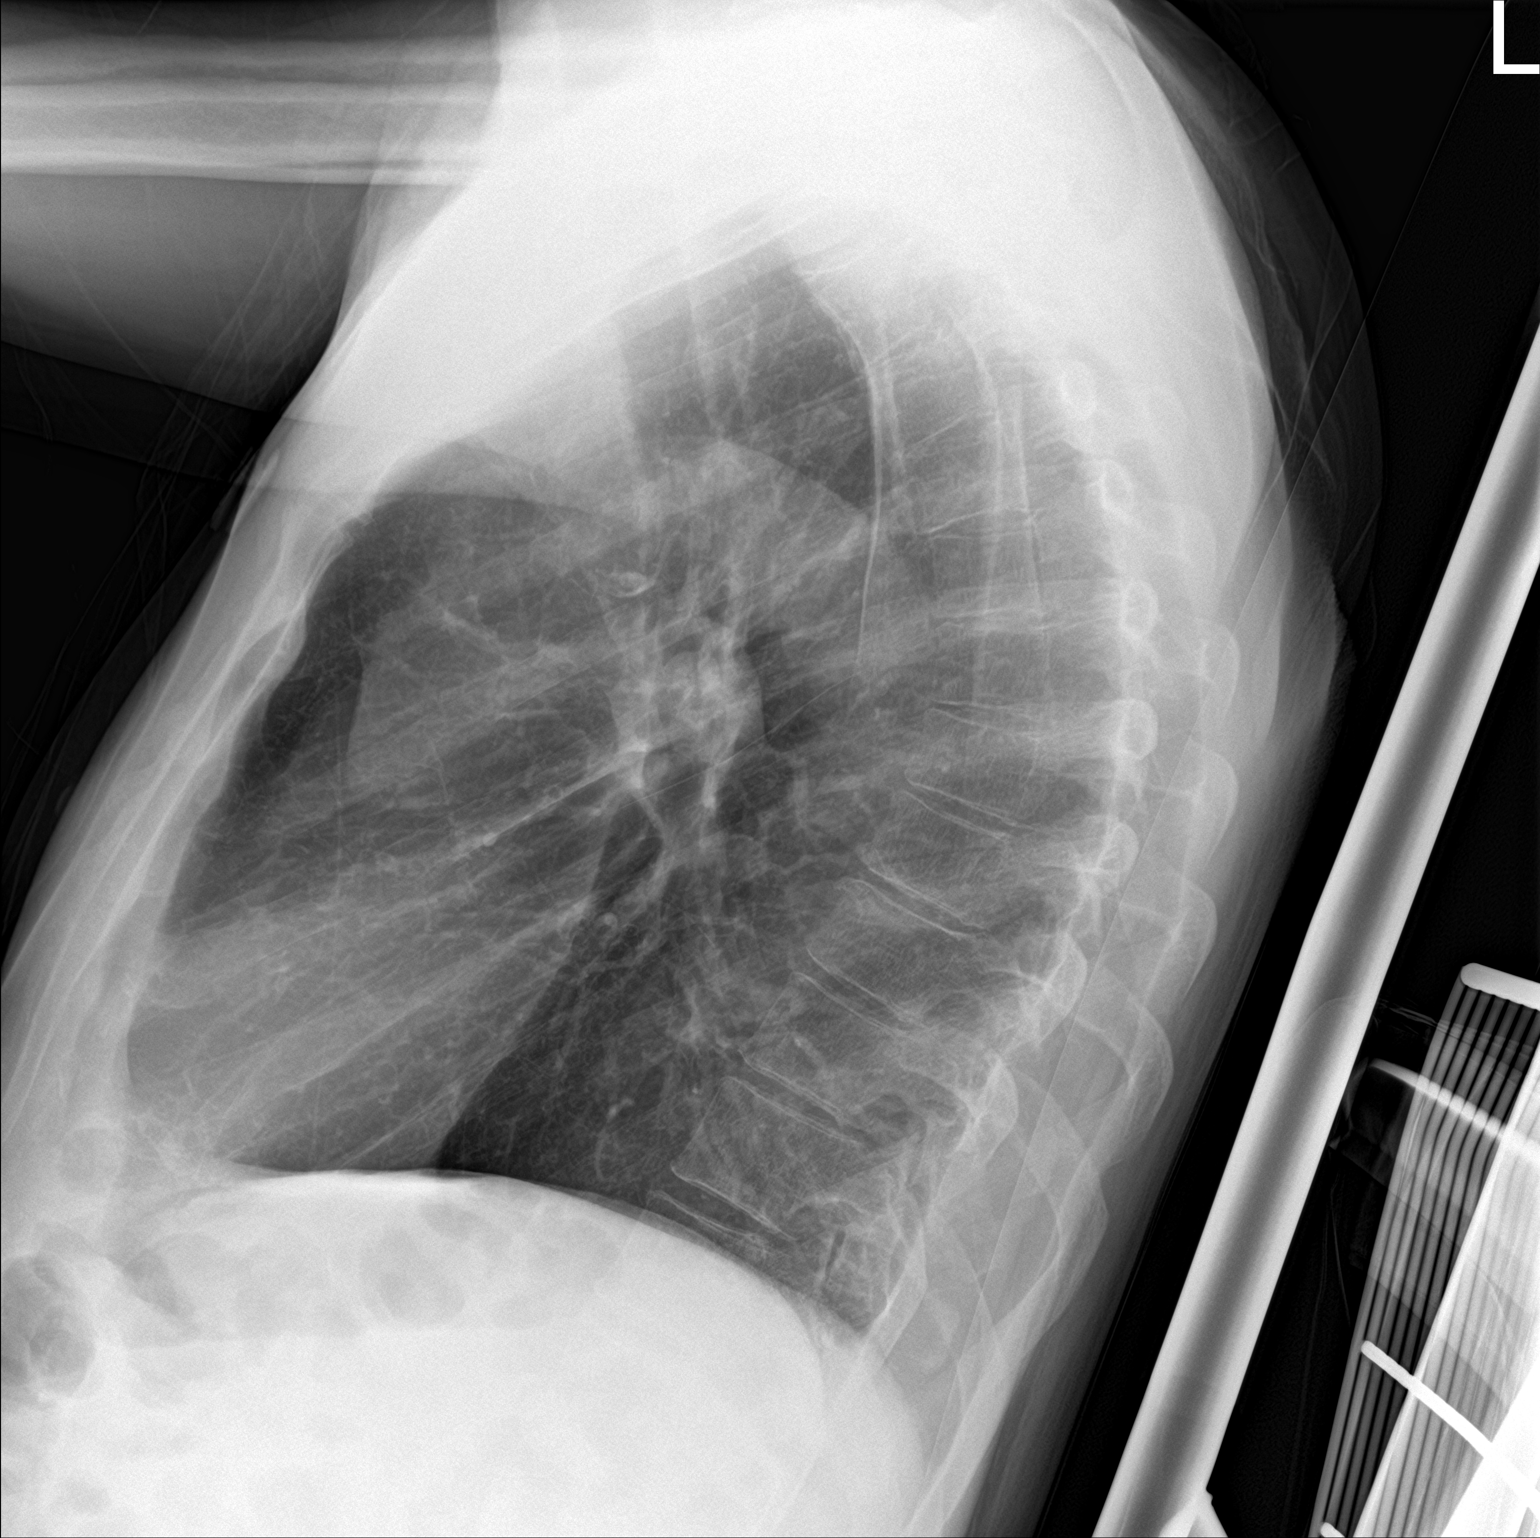

[2 of 2 positions shown; findings below may reference images not displayed]

FINDINGS: The heart size and mediastinal contours are within normal limits.
Aortic atherosclerosis. Both lungs are clear. Chronic blunting of
the right costophrenic angle laterally. Old fracture of the
posterolateral aspect of the left ninth rib.
IMPRESSION: No active cardiopulmonary disease.

## 2017-08-23 MED ORDER — ONDANSETRON HCL 4 MG PO TABS: 4.0000 mg | ORAL_TABLET | Freq: Four times a day (QID) | ORAL | Status: DC | PRN

## 2017-08-23 MED ORDER — SODIUM CHLORIDE 0.9 % IV SOLN
INTRAVENOUS | Status: AC
  Administered 2017-08-23 – 2017-08-24 (×3): via INTRAVENOUS

## 2017-08-23 MED ORDER — CEFTRIAXONE SODIUM 1 G IJ SOLR
1.0000 g | INTRAMUSCULAR | Status: DC
  Administered 2017-08-24 – 2017-08-25 (×2): 1 g via INTRAVENOUS
  Filled 2017-08-23 (×3): qty 10

## 2017-08-23 MED ORDER — SODIUM CHLORIDE 0.9 % IV BOLUS (SEPSIS)
500.0000 mL | Freq: Once | INTRAVENOUS | Status: AC
  Administered 2017-08-23: 500 mL via INTRAVENOUS

## 2017-08-23 MED ORDER — SODIUM CHLORIDE 0.9 % IV BOLUS (SEPSIS)
1000.0000 mL | Freq: Once | INTRAVENOUS | Status: AC
  Administered 2017-08-23: 1000 mL via INTRAVENOUS

## 2017-08-23 MED ORDER — LORAZEPAM 1 MG PO TABS
0.0000 mg | ORAL_TABLET | Freq: Two times a day (BID) | ORAL | Status: DC
Start: 2017-08-25 — End: 2017-08-26
  Administered 2017-08-25 – 2017-08-26 (×2): 2 mg via ORAL
  Filled 2017-08-23: qty 2
  Filled 2017-08-23: qty 1

## 2017-08-23 MED ORDER — METHYLPREDNISOLONE SODIUM SUCC 125 MG IJ SOLR
125.0000 mg | Freq: Once | INTRAMUSCULAR | Status: AC
  Administered 2017-08-23: 125 mg via INTRAVENOUS
  Filled 2017-08-23: qty 2

## 2017-08-23 MED ORDER — LORAZEPAM 1 MG PO TABS
1.0000 mg | ORAL_TABLET | Freq: Four times a day (QID) | ORAL | Status: DC | PRN
  Filled 2017-08-23: qty 1

## 2017-08-23 MED ORDER — ACETAMINOPHEN 650 MG RE SUPP: 650.0000 mg | Freq: Four times a day (QID) | RECTAL | Status: DC | PRN

## 2017-08-23 MED ORDER — LORAZEPAM 2 MG/ML IJ SOLN
1.0000 mg | Freq: Four times a day (QID) | INTRAMUSCULAR | Status: DC | PRN
  Administered 2017-08-23: 1 mg via INTRAVENOUS
  Filled 2017-08-23: qty 1

## 2017-08-23 MED ORDER — ENOXAPARIN SODIUM 40 MG/0.4ML ~~LOC~~ SOLN
40.0000 mg | SUBCUTANEOUS | Status: DC
  Administered 2017-08-23 – 2017-08-25 (×3): 40 mg via SUBCUTANEOUS
  Filled 2017-08-23 (×3): qty 0.4

## 2017-08-23 MED ORDER — LEVALBUTEROL HCL 0.63 MG/3ML IN NEBU: 0.6300 mg | INHALATION_SOLUTION | RESPIRATORY_TRACT | Status: DC | PRN

## 2017-08-23 MED ORDER — DEXTROSE 5 % IV SOLN
500.0000 mg | INTRAVENOUS | Status: DC
  Administered 2017-08-24 – 2017-08-25 (×2): 500 mg via INTRAVENOUS
  Filled 2017-08-23 (×3): qty 500

## 2017-08-23 MED ORDER — DEXTROSE 5 % IV SOLN
500.0000 mg | Freq: Once | INTRAVENOUS | Status: AC
  Administered 2017-08-23: 500 mg via INTRAVENOUS
  Filled 2017-08-23: qty 500

## 2017-08-23 MED ORDER — IOPAMIDOL (ISOVUE-370) INJECTION 76%
INTRAVENOUS | Status: AC
  Administered 2017-08-23: 100 mL
  Filled 2017-08-23: qty 100

## 2017-08-23 MED ORDER — VITAMIN B-1 100 MG PO TABS
100.0000 mg | ORAL_TABLET | Freq: Every day | ORAL | Status: DC
  Administered 2017-08-23 – 2017-08-26 (×4): 100 mg via ORAL
  Filled 2017-08-23 (×4): qty 1

## 2017-08-23 MED ORDER — CEFTRIAXONE SODIUM 1 G IJ SOLR
1.0000 g | Freq: Once | INTRAMUSCULAR | Status: AC
  Administered 2017-08-23: 1 g via INTRAVENOUS
  Filled 2017-08-23: qty 10

## 2017-08-23 MED ORDER — FOLIC ACID 1 MG PO TABS
1.0000 mg | ORAL_TABLET | Freq: Every day | ORAL | Status: DC
  Administered 2017-08-23 – 2017-08-26 (×4): 1 mg via ORAL
  Filled 2017-08-23 (×4): qty 1

## 2017-08-23 MED ORDER — ALBUTEROL (5 MG/ML) CONTINUOUS INHALATION SOLN
10.0000 mg/h | INHALATION_SOLUTION | Freq: Once | RESPIRATORY_TRACT | Status: AC
  Administered 2017-08-23: 10 mg/h via RESPIRATORY_TRACT

## 2017-08-23 MED ORDER — ACETAMINOPHEN 325 MG PO TABS: 650.0000 mg | ORAL_TABLET | Freq: Four times a day (QID) | ORAL | Status: DC | PRN

## 2017-08-23 MED ORDER — HYDRALAZINE HCL 20 MG/ML IJ SOLN
10.0000 mg | INTRAMUSCULAR | Status: DC | PRN
  Administered 2017-08-23 – 2017-08-26 (×4): 10 mg via INTRAVENOUS
  Filled 2017-08-23 (×5): qty 1

## 2017-08-23 MED ORDER — NICOTINE 21 MG/24HR TD PT24
21.0000 mg | MEDICATED_PATCH | Freq: Once | TRANSDERMAL | Status: AC
  Administered 2017-08-23: 21 mg via TRANSDERMAL
  Filled 2017-08-23: qty 1

## 2017-08-23 MED ORDER — ADULT MULTIVITAMIN W/MINERALS CH
1.0000 | ORAL_TABLET | Freq: Every day | ORAL | Status: DC
  Administered 2017-08-23 – 2017-08-26 (×4): 1 via ORAL
  Filled 2017-08-23 (×4): qty 1

## 2017-08-23 MED ORDER — ONDANSETRON HCL 4 MG/2ML IJ SOLN: 4.0000 mg | Freq: Four times a day (QID) | INTRAMUSCULAR | Status: DC | PRN

## 2017-08-23 MED ORDER — LEVALBUTEROL HCL 0.63 MG/3ML IN NEBU
0.6300 mg | INHALATION_SOLUTION | RESPIRATORY_TRACT | Status: DC
  Administered 2017-08-23: 0.63 mg via RESPIRATORY_TRACT
  Filled 2017-08-23: qty 3

## 2017-08-23 MED ORDER — LORAZEPAM 1 MG PO TABS: 0.0000 mg | ORAL_TABLET | Freq: Four times a day (QID) | ORAL | Status: AC

## 2017-08-23 MED ORDER — THIAMINE HCL 100 MG/ML IJ SOLN: 100.0000 mg | Freq: Every day | INTRAMUSCULAR | Status: DC

## 2017-08-23 NOTE — Progress Notes (Addendum)
Patient is a 61 year old gentleman presented to the ED with 3 weeks of cough, shortness of breath on minimal exertion, decreased oral intake, weight loss.  Patient noted to have an elevated lactic acid level that went up from 4-8 per ED physician.  Concern for possible early sepsis.  CT angiogram of the chest has been ordered and is pending.  She is accepted to the stepdown unit for acute respiratory distress felt per ED to be secondary to possible early developing pneumonia versus COPD exacerbation.  No charge.

## 2017-08-23 NOTE — ED Provider Notes (Signed)
[facility] EMERGENCY DEPARTMENT Provider Note   CSN: [id number] Arrival date & time: 08/23/17  1226     History   Chief Complaint Chief Complaint  Patient presents with  . Chest Pain  . Shortness of Breath    HPI Seth [name] is a 61 y.o. male.  Is here for evaluation of illness for 3 weeks associated with cough, shortness of breath with ambulation, weight loss of 25 pounds, decreased oral intake, and chest discomfort.  He continues to smoke cigarettes.  He has never had known cardiac or pulmonary problems.  He does not take regular medications.  There are no other known modifying factors.  HPI  Past Medical History:  Diagnosis Date  . Gout     There are no active problems to display for this patient.   Past Surgical History:  Procedure Laterality Date  . cervical     cervical disc fusion       Home Medications    Prior to Admission medications   Medication Sig Start Date End Date Taking? Authorizing Provider  acetaminophen (TYLENOL) 500 MG tablet Take 500-1,000 mg by mouth every 6 (six) hours as needed (for headaches).   Yes [provider]  albuterol (PROVENTIL HFA;VENTOLIN HFA) 108 (90 Base) MCG/ACT inhaler Inhale 2 puffs into the lungs every 4 (four) hours as needed for wheezing or shortness of breath. Patient not taking: Reported on 08/23/2017 07/24/16   Melony Overly, MD  benzonatate (TESSALON) 100 MG capsule Take 1 capsule (100 mg total) by mouth every 8 (eight) hours. Patient not taking: Reported on 08/23/2017 06/21/16   Jeannett Senior, PA-C  HYDROcodone-homatropine (HYCODAN) 5-1.5 MG/5ML syrup Take 5 mLs by mouth every 6 (six) hours as needed for cough. Patient not taking: Reported on 08/23/2017 07/24/16   Melony Overly, MD  ibuprofen (ADVIL,MOTRIN) 600 MG tablet Take 1 tablet (600 mg total) by mouth every 6 (six) hours as needed. Patient not taking: Reported on 08/23/2017 06/03/16   Nona Dell, PA-C    indomethacin (INDOCIN) 50 MG capsule Take 1 capsule (50 mg total) by mouth 3 (three) times daily as needed (shoulder or toe pain). Patient not taking: Reported on 08/23/2017 10/28/12   Daleen Bo, MD  meloxicam (MOBIC) 15 MG tablet Take 1 tablet (15 mg total) by mouth daily. Patient not taking: Reported on 08/23/2017 06/21/16   Jeannett Senior, PA-C  methocarbamol (ROBAXIN) 500 MG tablet Take 1 tablet (500 mg total) by mouth 2 (two) times daily. Patient not taking: Reported on 08/23/2017 06/03/16   Nona Dell, PA-C  naproxen (NAPROSYN) 500 MG tablet Take 1 tablet (500 mg total) by mouth 2 (two) times daily with a meal. Patient not taking: Reported on 08/23/2017 07/13/16   Leo Grosser, MD  oxyCODONE (ROXICODONE) 5 MG immediate release tablet Take 1 tablet (5 mg total) by mouth every 6 (six) hours as needed for severe pain. Patient not taking: Reported on 08/23/2017 07/13/16   Leo Grosser, MD    Family History No family history on file.  Social History Social History   Tobacco Use  . Smoking status: Current Every Day Smoker    Packs/day: 1.00    Types: Cigarettes  . Smokeless tobacco: Never Used  Substance Use Topics  . Alcohol use: Yes    Comment: weekend use only  . Drug use: No     Allergies   Patient has no known allergies.   Review of Systems Review of Systems  All other  systems reviewed and are negative.    Physical Exam Updated Vital Signs BP (!) 185/99   Pulse (!) 136   Temp 97.9 F (36.6 C) (Oral)   Resp 19   Ht 6' (1.829 m)   Wt 77.1 kg (170 lb)   SpO2 100%   BMI 23.06 kg/m   Physical Exam  Constitutional: He is oriented to person, place, and time. He appears well-developed. He appears ill (He appears chronically ill).  He appears older than stated age.  HENT:  Head: Normocephalic and atraumatic.  Right Ear: External ear normal.  Left Ear: External ear normal.  Eyes: Conjunctivae and EOM are normal. Pupils are equal, round,  and reactive to light.  Neck: Normal range of motion and phonation normal. Neck supple.  Cardiovascular: Normal rate, regular rhythm and normal heart sounds.  Pulmonary/Chest: Effort normal and breath sounds normal. No accessory muscle usage. No tachypnea. No respiratory distress. He exhibits no bony tenderness.  Abdominal: Soft. There is no tenderness.  Musculoskeletal: Normal range of motion.       Right lower leg: Normal.       Left lower leg: Normal.  Neurological: He is alert and oriented to person, place, and time. No cranial nerve deficit or sensory deficit. He exhibits normal muscle tone. Coordination normal.  Skin: Skin is warm, dry and intact.  Psychiatric: He has a normal mood and affect. His behavior is normal. Judgment and thought content normal.  Nursing note and vitals reviewed.    ED Treatments / Results  Labs (all labs ordered are listed, but only abnormal results are displayed) Labs Reviewed  BASIC METABOLIC PANEL - Abnormal; Notable for the following components:      Result Value   Chloride 99 (*)    CO2 20 (*)    Glucose, Bld 108 (*)    Anion gap 16 (*)    All other components within normal limits  CBC - Abnormal; Notable for the following components:   RBC 3.81 (*)    Hemoglobin 12.4 (*)    HCT 37.5 (*)    RDW 17.4 (*)    All other components within normal limits  URINALYSIS, ROUTINE W REFLEX MICROSCOPIC - Abnormal; Notable for the following components:   Specific Gravity, Urine 1.004 (*)    Hgb urine dipstick SMALL (*)    All other components within normal limits  HEPATIC FUNCTION PANEL - Abnormal; Notable for the following components:   ALT 14 (*)    Bilirubin, Direct <0.1 (*)    All other components within normal limits  I-STAT CG4 LACTIC ACID, ED - Abnormal; Notable for the following components:   Lactic Acid, Venous 4.75 (*)    All other components within normal limits  CULTURE, BLOOD (ROUTINE X 2)  CULTURE, BLOOD (ROUTINE X 2)  URINE CULTURE    I-STAT TROPONIN, ED  I-STAT CG4 LACTIC ACID, ED    EKG  EKG Interpretation  Date/Time:  Thursday August 23 2017 12:36:39 EST Ventricular Rate:  115 PR Interval:  156 QRS Duration: 82 QT Interval:  332 QTC Calculation: 459 R Axis:   68 Text Interpretation:  Sinus tachycardia Biatrial enlargement Anteroseptal infarct , age undetermined Abnormal ECG since last tracing no significant change Right atrial hypertrophy is new Confirmed by Daleen Bo [phone]) on 08/23/2017 2:13:17 PM       Radiology Dg Chest 2 View  Result Date: 08/23/2017 CLINICAL DATA:  Shortness of breath, chest pain, and cough. EXAM: CHEST  2 VIEW COMPARISON:  Of of 03/16/2016 FINDINGS: The heart size and mediastinal contours are within normal limits. Aortic atherosclerosis. Both lungs are clear. Chronic blunting of the right costophrenic angle laterally. Old fracture of the posterolateral aspect of the left ninth rib. IMPRESSION: No active cardiopulmonary disease. Electronically Signed   By: Lorriane Shire M.D.   On: 08/23/2017 13:09    Procedures Procedures (including critical care time)  Medications Ordered in ED Medications  methylPREDNISolone sodium succinate (SOLU-MEDROL) 125 mg/2 mL injection 125 mg (not administered)  cefTRIAXone (ROCEPHIN) 1 g in dextrose 5 % 50 mL IVPB (0 g Intravenous Stopped 08/23/17 1457)  azithromycin (ZITHROMAX) 500 mg in dextrose 5 % 250 mL IVPB (0 mg Intravenous Stopped 08/23/17 1619)  sodium chloride 0.9 % bolus 1,000 mL (0 mLs Intravenous Stopped 08/23/17 1619)    And  sodium chloride 0.9 % bolus 1,000 mL (0 mLs Intravenous Stopped 08/23/17 1609)    And  sodium chloride 0.9 % bolus 500 mL (0 mLs Intravenous Stopped 08/23/17 1609)  albuterol (PROVENTIL,VENTOLIN) solution continuous neb (10 mg/hr Nebulization Given 08/23/17 1424)     Initial Impression / Assessment and Plan / ED Course  I have reviewed the triage vital signs and the nursing notes.  Pertinent labs &  imaging results that were available during my care of the patient were reviewed by me and considered in my medical decision making (see chart for details).  Clinical Course as of Aug 23 1650  Thu Aug 23, 2017  1411 Normal Troponin i, poc: 0.00 [EW]  1411 Normal WBC: 6.4 [EW]  1411 Slightly low Hemoglobin: (!) 12.4 [EW]  1411 Elevated Lactic Acid, Venous: (!!) 4.75 [EW]  1411 Normal Sodium: 135 [EW]  1411 Low Chloride: (!) 99 [EW]  1412 Glucose: (!) 108 [EW]  1412 CO2: (!) 20 [EW]  1412 Low  Glucose: (!) 108 [EW]  1412 Normal Creatinine: 1.07 [EW]  1412 No acute abnormality DG Chest 2 View [EW]  1418 Borderline positive SIRS criteria, with shortness of breath, and clinical picture consistent with COPD exacerbation.  No fever present.  Markedly elevated serum lactate.  Parikh antibiotics and IV fluid boluses ordered.  Tachycardic with normal blood pressure.  [EW]  1640 Normal Troponin i, poc: 0.00 [EW]  1640 Elevated Lactic Acid, Venous: (!!) 4.75 [EW]  1640 Normal  WBC: 6.4 [EW]  1640 normal WBC, UA: 0-5 [EW]  1640 normal Sodium: 135 [EW]  1640 normal Creatinine: 1.07 [EW]  1641 NAD DG Chest 2 View [EW]    Clinical Course User Index [EW] Daleen Bo, MD     Patient Vitals for the past 24 hrs:  BP Temp Temp src Pulse Resp SpO2 Height Weight  08/23/17 1630 (!) 185/99 - - (!) 136 19 100 % - -  08/23/17 1615 - - - (!) 135 19 100 % - -  08/23/17 1545 (!) 172/87 - - (!) 136 (!) 22 99 % - -  08/23/17 1515 (!) 177/87 - - (!) 125 19 100 % - -  08/23/17 1500 (!) 173/91 - - (!) 110 17 100 % - -  08/23/17 1445 (!) 177/97 - - (!) 103 13 100 % - -  08/23/17 1427 (!) 159/105 - - (!) 103 15 100 % - -  08/23/17 1415 - - - 100 12 100 % - -  08/23/17 1405 - - - (!) 105 (!) 21 100 % - -  08/23/17 1243 (!) 139/97 97.9 F (36.6 C) Oral (!) 118 20 98 % 6' (1.829 m)  77.1 kg (170 lb)    4:41 PM Reevaluation with update and discussion. After initial assessment and treatment, an updated  evaluation reveals he is alert and feels somewhat better, however heart rate is elevated at 144, following treatment with albuterol.  Respiratory rate not elevated, mid 20s.  Findings discussed with patient and family members all questions answered. Daleen Bo   4:52 PM-Consult Hospitalist to admit  Final Clinical Impressions(s) / ED Diagnoses   Final diagnoses:  COPD exacerbation ([place])  Tachycardia   Nonspecific shortness of breath, most likely related to COPD exacerbation.  Elevated lactate, but chest x-ray does not indicate pneumonia.  Empiric treatment started for community-acquired pneumonia, screening labs indicated markedly elevated lactate.  He was treated with high volume saline bolus.  Doubt ACS or metabolic impending vascular collapse.  He will require admission for stabilization.  Nursing Notes Reviewed/ Care Coordinated Applicable Imaging Reviewed Interpretation of Laboratory Data incorporated into ED treatment  Plan-admit  ED Discharge Orders    None       Daleen Bo, MD 08/24/17 1238

## 2017-08-23 NOTE — ED Notes (Signed)
Patient transported to CT 

## 2017-08-23 NOTE — ED Notes (Signed)
Writer notified [facility]PD [place] of abnormal I stat lactic result 8.05

## 2017-08-23 NOTE — Progress Notes (Signed)
CRITICAL VALUE ALERT  Critical Value:  Lactic Acid 5.8  Date & Time Notied:  08/23/2017 2300  Provider Notified: Dr. Hal Hope  Orders Received/Actions taken: pending pt on NS @ 123mls/hr

## 2017-08-23 NOTE — ED Notes (Signed)
Lactic Acid 4.75

## 2017-08-23 NOTE — H&P (Signed)
History and Physical    Seth [name] QIO:[id number] DOB: 1956-02-15 DOA: 08/23/2017  PCP: Patient, No Pcp Per  Patient coming from: Home.  Chief Complaint: Shortness of breath.  HPI: Seth [name] is a 61 y.o. male with history of polysubstance abuse who has not been to a physician for many years and was previously told that he may have high blood pressure presents to the ER because of progressive exertional shortness of breath for the last 3 weeks.  Has been running some productive cough denies any chest pain.  Denies any fever or chills.  Denies any recent sick contacts or recent travel.  ED Course: The ER patient is found to be tachycardic and hypoxic and lactate level is 4.7 which worsened to 8.05.  CT angiogram of the chest was negative for PE but showing emphysematous changes.  Patient was given fluid bolus for sepsis secondary to lactate levels increasing and patient being tachycardic.  Patient was placed on empiric antibiotics and nebulizer treatment for possible COPD.  On my exam patient is not in distress but states his shortness of breath mostly in exertion.  Denies any nausea vomiting abdominal pain or diarrhea.  Review of Systems: As per HPI, rest all negative.   Past Medical History:  Diagnosis Date  . Gout     Past Surgical History:  Procedure Laterality Date  . cervical     cervical disc fusion  . cyst removal from hand       reports that he has been smoking cigarettes.  He has been smoking about 1.00 pack per day. he has never used smokeless tobacco. He reports that he drinks alcohol. He reports that he does not use drugs.  No Known Allergies  Family History  Problem Relation Age of Onset  . Hypertension Mother   . Hypertension Father     Prior to Admission medications   Medication Sig Start Date End Date Taking? Authorizing Provider  acetaminophen (TYLENOL) 500 MG tablet Take 500-1,000 mg by mouth every 6 (six) hours as needed (for headaches).   Yes  [provider]  albuterol (PROVENTIL HFA;VENTOLIN HFA) 108 (90 Base) MCG/ACT inhaler Inhale 2 puffs into the lungs every 4 (four) hours as needed for wheezing or shortness of breath. Patient not taking: Reported on 08/23/2017 07/24/16   Melony Overly, MD  benzonatate (TESSALON) 100 MG capsule Take 1 capsule (100 mg total) by mouth every 8 (eight) hours. Patient not taking: Reported on 08/23/2017 06/21/16   Jeannett Senior, PA-C  HYDROcodone-homatropine (HYCODAN) 5-1.5 MG/5ML syrup Take 5 mLs by mouth every 6 (six) hours as needed for cough. Patient not taking: Reported on 08/23/2017 07/24/16   Melony Overly, MD  ibuprofen (ADVIL,MOTRIN) 600 MG tablet Take 1 tablet (600 mg total) by mouth every 6 (six) hours as needed. Patient not taking: Reported on 08/23/2017 06/03/16   Nona Dell, PA-C  indomethacin (INDOCIN) 50 MG capsule Take 1 capsule (50 mg total) by mouth 3 (three) times daily as needed (shoulder or toe pain). Patient not taking: Reported on 08/23/2017 10/28/12   Daleen Bo, MD  meloxicam (MOBIC) 15 MG tablet Take 1 tablet (15 mg total) by mouth daily. Patient not taking: Reported on 08/23/2017 06/21/16   Jeannett Senior, PA-C  methocarbamol (ROBAXIN) 500 MG tablet Take 1 tablet (500 mg total) by mouth 2 (two) times daily. Patient not taking: Reported on 08/23/2017 06/03/16   Nona Dell, PA-C  naproxen (NAPROSYN) 500 MG tablet Take 1 tablet (  500 mg total) by mouth 2 (two) times daily with a meal. Patient not taking: Reported on 08/23/2017 07/13/16   Leo Grosser, MD  oxyCODONE (ROXICODONE) 5 MG immediate release tablet Take 1 tablet (5 mg total) by mouth every 6 (six) hours as needed for severe pain. Patient not taking: Reported on 08/23/2017 07/13/16   Leo Grosser, MD    Physical Exam: Vitals:   08/23/17 1827 08/23/17 1830 08/23/17 1845 08/23/17 2037  BP:  (!) 174/91 (!) 177/92 (!) 161/98  Pulse:  (!) 118 (!) 120 (!) 123  Resp:  12  (!) 24   Temp:    99.6 F (37.6 C)  TempSrc:    Oral  SpO2:  100% 100% 100%  Weight: 74.8 kg (165 lb)   72.1 kg (158 lb 15.2 oz)  Height: 6\' 3"  (1.905 m)   6\' 3"  (1.905 m)      Constitutional: Moderately built and nourished. Vitals:   08/23/17 1827 08/23/17 1830 08/23/17 1845 08/23/17 2037  BP:  (!) 174/91 (!) 177/92 (!) 161/98  Pulse:  (!) 118 (!) 120 (!) 123  Resp:  12 (!) 24   Temp:    99.6 F (37.6 C)  TempSrc:    Oral  SpO2:  100% 100% 100%  Weight: 74.8 kg (165 lb)   72.1 kg (158 lb 15.2 oz)  Height: 6\' 3"  (1.905 m)   6\' 3"  (1.905 m)   Eyes: Anicteric no pallor. ENMT: No discharge from the ears eyes nose or mouth. Neck: No JVD appreciated no mass felt. Respiratory: No rhonchi or crepitations. Cardiovascular: S1-S2 heard no murmurs appreciated. Abdomen: Soft nontender bowel sounds present. Musculoskeletal: No edema.  No joint effusion. Skin: No rash.  Skin appears warm. Neurologic: Alert awake oriented to time place and person.  Moves all extremities. Psychiatric: Appears normal.  Normal affect.   Labs on Admission: I have personally reviewed following labs and imaging studies  CBC: Recent Labs  Lab 08/23/17 1237  WBC 6.4  HGB 12.4*  HCT 37.5*  MCV 98.4  PLT 366   Basic Metabolic Panel: Recent Labs  Lab 08/23/17 1237  NA 135  K 4.5  CL 99*  CO2 20*  GLUCOSE 108*  BUN 10  CREATININE 1.07  CALCIUM 9.8   GFR: Estimated Creatinine Clearance: 75.8 mL/min (by C-G formula based on SCr of 1.07 mg/dL). Liver Function Tests: Recent Labs  Lab 08/23/17 1245  AST 34  ALT 14*  ALKPHOS 74  BILITOT 0.7  PROT 8.0  ALBUMIN 4.4   No results for input(s): LIPASE, AMYLASE in the last 168 hours. No results for input(s): AMMONIA in the last 168 hours. Coagulation Profile: No results for input(s): INR, PROTIME in the last 168 hours. Cardiac Enzymes: No results for input(s): CKTOTAL, CKMB, CKMBINDEX, TROPONINI in the last 168 hours. BNP (last 3 results) No  results for input(s): PROBNP in the last 8760 hours. HbA1C: No results for input(s): HGBA1C in the last 72 hours. CBG: No results for input(s): GLUCAP in the last 168 hours. Lipid Profile: No results for input(s): CHOL, HDL, LDLCALC, TRIG, CHOLHDL, LDLDIRECT in the last 72 hours. Thyroid Function Tests: No results for input(s): TSH, T4TOTAL, FREET4, T3FREE, THYROIDAB in the last 72 hours. Anemia Panel: No results for input(s): VITAMINB12, FOLATE, FERRITIN, TIBC, IRON, RETICCTPCT in the last 72 hours. Urine analysis:    Component Value Date/Time   COLORURINE YELLOW 08/23/2017 1530   APPEARANCEUR CLEAR 08/23/2017 1530   LABSPEC 1.004 (L) 08/23/2017 1530  PHURINE 6.0 08/23/2017 1530   GLUCOSEU NEGATIVE 08/23/2017 1530   HGBUR SMALL (A) 08/23/2017 1530   BILIRUBINUR NEGATIVE 08/23/2017 1530   KETONESUR NEGATIVE 08/23/2017 1530   PROTEINUR NEGATIVE 08/23/2017 1530   NITRITE NEGATIVE 08/23/2017 1530   LEUKOCYTESUR NEGATIVE 08/23/2017 1530   Sepsis Labs: @LABRCNTIP (procalcitonin:4,lacticidven:4) )No results found for this or any previous visit (from the past 240 hour(s)).   Radiological Exams on Admission: Dg Chest 2 View  Result Date: 08/23/2017 CLINICAL DATA:  Shortness of breath, chest pain, and cough. EXAM: CHEST  2 VIEW COMPARISON:  Of of 03/16/2016 FINDINGS: The heart size and mediastinal contours are within normal limits. Aortic atherosclerosis. Both lungs are clear. Chronic blunting of the right costophrenic angle laterally. Old fracture of the posterolateral aspect of the left ninth rib. IMPRESSION: No active cardiopulmonary disease. Electronically Signed   By: Lorriane Shire M.D.   On: 08/23/2017 13:09   Ct Angio Chest Pe W And/or Wo Contrast  Result Date: 08/23/2017 CLINICAL DATA:  Chest pain, shortness of breath and weakness for several weeks, increased symptoms, now cannot walk from one room to the next without chest pain and significant shortness of breath, smoker EXAM:  CT ANGIOGRAPHY CHEST WITH CONTRAST TECHNIQUE: Multidetector CT imaging of the chest was performed using the standard protocol during bolus administration of intravenous contrast. Multiplanar CT image reconstructions and MIPs were obtained to evaluate the vascular anatomy. CONTRAST:  147mL ISOVUE-370 IOPAMIDOL (ISOVUE-370) INJECTION 76% IV COMPARISON:  None FINDINGS: Cardiovascular: Atherosclerotic calcifications aorta, coronary arteries and proximal great vessels. Aorta dilated 4.1 cm diameter at distal ascending aorta. No evidence of dissection. Pulmonary artery is adequately opacified and patent. No evidence of pulmonary embolism. No pericardial effusion. Mediastinum/Nodes: Esophagus normal appearance. Base of cervical region normal appearance. No thoracic adenopathy. Lungs/Pleura: Nodular density subpleural at posteromedial RIGHT upper lobe 6 mm diameter image 31 diameter stable in size but now abutting pleural surface. Dependent atelectasis in the posterior lower lobes. Mild underlying emphysematous changes. No acute infiltrate, pleural effusion or pneumothorax. Upper Abdomen: Small cyst at posterior aspect of upper pole RIGHT kidney unchanged. Diverticulosis of proximal descending colon. Remaining visualized upper abdomen unremarkable. Musculoskeletal: No acute osseous findings. Review of the MIP images confirms the above findings. IMPRESSION: No evidence pulmonary embolism. Stable 6 mm RIGHT upper lobe nodule. Mild emphysematous changes without infiltrate. Aortic Atherosclerosis (ICD10-I70.0). Emphysema (ICD10-J43.9). Electronically Signed   By: Lavonia Dana M.D.   On: 08/23/2017 19:31    EKG: Independently reviewed.  Sinus tachycardia with biatrial enlargement.  Nonspecific ST-T changes.  Assessment/Plan Principal Problem:   Acute respiratory failure with hypoxia (HCC) Active Problems:   Chest pain   Elevated blood pressure reading   Tobacco abuse   COPD exacerbation ([place])    1. Acute  respiratory failure with hypoxia -cause not clear.  Patient symptoms are exertional.  No active patient on nebulizer and Pulmicort.  Check 2D echo check BNP. 2. Elevated lactic acid with tachycardia concerning for developing sepsis -patient is empirically placed on antibiotics.  Follow cultures.  Continue with hydration.  Check pro calcitonin levels. 3. Elevated blood pressure -for now I have kept patient on as needed IV hydralazine.  Closely follow blood pressure trends and if still elevated may need definite antihypertensives. 4. Polysubstance abuse -patient advised to quit this habit including alcohol tobacco and drugs.  The patient is on CIWA protocol. 5. Normocytic normochromic anemia -check anemia panel follow CBC.   DVT prophylaxis: Lovenox. Code Status: Full code. Family Communication: Discussed with  patient. Disposition Plan: Home. Consults called: None. Admission status: Inpatient.   Rise Patience MD [facility] Pager [phone].  If 7PM-7AM, please contact night-coverage [email or web address] Password [email or web address]  08/23/2017, 9:20 PM

## 2017-08-23 NOTE — ED Triage Notes (Signed)
Pt states chest pain, sob and weakness for several weeks that increased to the point that he cannot move from one room to the next without becoming quite sob and experiencing chest pain. Pt also c/o productive cough.

## 2017-08-23 NOTE — ED Provider Notes (Signed)
Pt signed out by Dr. Eulis Foster pending call back from the hospitalist service.  The pt's second lactic acid elevated at 8.05 after IVFs, so a plasma lactic was ordered.  I also called a code sepsis due to bronchitis and abnormal vital signs.  Pt given rocephin and zithromax and code sepsis fluids.  Pt is feeling some better, but is still sob.  Pt d/w Dr. Grandville Silos ([facility]) who will admit.  He requested an abg and a CT angio, so that was ordered.   Isla Pence, MD 08/23/17 Pauline Aus

## 2017-08-23 NOTE — Progress Notes (Signed)
CRITICAL VALUE ALERT  Critical Value:  Lactic Acid  Date & Time Notied: 08/23/2016  2045  Provider Notified: Dr. Hal Hope  Orders Received/Actions taken: pending orders

## 2017-08-23 NOTE — ED Notes (Signed)
MD states okay for patient to eat/drink - provided [place] sandwich and ginger ale.

## 2017-08-23 NOTE — Progress Notes (Signed)
Patient admitted to room 3M10 with no problems.  Patient is alert and very cooperative.  Appropriate historian for medical information.  CHG wipe down performed, MRSA nares swab obtained. Patient oriented to room and call bell system.

## 2017-08-24 ENCOUNTER — Inpatient Hospital Stay (HOSPITAL_COMMUNITY): Payer: Self-pay

## 2017-08-24 ENCOUNTER — Other Ambulatory Visit: Payer: Self-pay

## 2017-08-24 DIAGNOSIS — I361 Nonrheumatic tricuspid (valve) insufficiency: Secondary | ICD-10-CM

## 2017-08-24 LAB — RAPID URINE DRUG SCREEN, HOSP PERFORMED
Amphetamines: NOT DETECTED
BARBITURATES: NOT DETECTED
Benzodiazepines: NOT DETECTED
Cocaine: POSITIVE — AB
Opiates: NOT DETECTED
TETRAHYDROCANNABINOL: NOT DETECTED

## 2017-08-24 LAB — BASIC METABOLIC PANEL
ANION GAP: 16 — AB (ref 5–15)
BUN: 5 mg/dL — ABNORMAL LOW (ref 6–20)
CHLORIDE: 103 mmol/L (ref 101–111)
CO2: 16 mmol/L — ABNORMAL LOW (ref 22–32)
Calcium: 8.4 mg/dL — ABNORMAL LOW (ref 8.9–10.3)
Creatinine, Ser: 1.01 mg/dL (ref 0.61–1.24)
GFR calc Af Amer: >60 mL/min (ref >60)
GLUCOSE: 168 mg/dL — AB (ref 65–99)
POTASSIUM: 3.7 mmol/L (ref 3.5–5.1)
SODIUM: 135 mmol/L (ref 135–145)

## 2017-08-24 LAB — CBC
HEMATOCRIT: 28.2 % — AB (ref 39.0–52.0)
HEMOGLOBIN: 9.2 g/dL — AB (ref 13.0–17.0)
MCH: 32.2 pg (ref 26.0–34.0)
MCHC: 32.6 g/dL (ref 30.0–36.0)
MCV: 98.6 fL (ref 78.0–100.0)
Platelets: 147 10*3/uL — ABNORMAL LOW (ref 150–400)
RBC: 2.86 MIL/uL — ABNORMAL LOW (ref 4.22–5.81)
RDW: 17.9 % — ABNORMAL HIGH (ref 11.5–15.5)
WBC: 6.6 10*3/uL (ref 4.0–10.5)

## 2017-08-24 LAB — TROPONIN I: Troponin I: <0.03 ng/mL (ref <0.03)

## 2017-08-24 LAB — LACTIC ACID, PLASMA
Lactic Acid, Venous: 4.7 mmol/L (ref 0.5–1.9)
Lactic Acid, Venous: 3.9 mmol/L (ref 0.5–1.9)

## 2017-08-24 LAB — ECHOCARDIOGRAM COMPLETE
HEIGHTINCHES: 75 in
Weight: 2582.03 oz

## 2017-08-24 LAB — IRON AND TIBC
IRON: 33 ug/dL — AB (ref 45–182)
Saturation Ratios: 16 % — ABNORMAL LOW (ref 17.9–39.5)
TIBC: 200 ug/dL — AB (ref 250–450)
UIBC: 167 ug/dL

## 2017-08-24 LAB — URINE CULTURE: CULTURE: NO GROWTH

## 2017-08-24 LAB — FOLATE: Folate: 15.6 ng/mL (ref >5.9)

## 2017-08-24 LAB — RETICULOCYTES
RBC.: 2.84 MIL/uL — ABNORMAL LOW (ref 4.22–5.81)
Retic Count, Absolute: 22.7 10*3/uL (ref 19.0–186.0)
Retic Ct Pct: 0.8 % (ref 0.4–3.1)

## 2017-08-24 LAB — HIV ANTIBODY (ROUTINE TESTING W REFLEX): HIV SCREEN 4TH GENERATION: NONREACTIVE

## 2017-08-24 LAB — MRSA PCR SCREENING: MRSA BY PCR: NEGATIVE

## 2017-08-24 LAB — FERRITIN: FERRITIN: 423 ng/mL — AB (ref 24–336)

## 2017-08-24 LAB — VITAMIN B12: VITAMIN B 12: 283 pg/mL (ref 180–914)

## 2017-08-24 MED ORDER — SODIUM CHLORIDE 0.9 % IV BOLUS (SEPSIS)
1000.0000 mL | Freq: Once | INTRAVENOUS | Status: AC
  Administered 2017-08-24: 1000 mL via INTRAVENOUS

## 2017-08-24 MED ORDER — SODIUM CHLORIDE 0.9 % IV SOLN
INTRAVENOUS | Status: AC
  Administered 2017-08-24: 125 mL/h via INTRAVENOUS
  Administered 2017-08-25 (×2): via INTRAVENOUS

## 2017-08-24 MED ORDER — GI COCKTAIL ~~LOC~~
30.0000 mL | Freq: Once | ORAL | Status: AC
  Administered 2017-08-24: 30 mL via ORAL
  Filled 2017-08-24: qty 30

## 2017-08-24 MED ORDER — LEVALBUTEROL HCL 0.63 MG/3ML IN NEBU
0.6300 mg | INHALATION_SOLUTION | RESPIRATORY_TRACT | Status: DC
  Administered 2017-08-24: 0.63 mg via RESPIRATORY_TRACT
  Filled 2017-08-24 (×2): qty 3

## 2017-08-24 MED ORDER — ENSURE ENLIVE PO LIQD
237.0000 mL | Freq: Two times a day (BID) | ORAL | Status: DC
  Administered 2017-08-25 (×2): 237 mL via ORAL

## 2017-08-24 MED ORDER — INFLUENZA VAC SPLIT QUAD 0.5 ML IM SUSY
0.5000 mL | PREFILLED_SYRINGE | INTRAMUSCULAR | Status: AC
  Administered 2017-08-25: 0.5 mL via INTRAMUSCULAR
  Filled 2017-08-24: qty 0.5

## 2017-08-24 NOTE — Progress Notes (Signed)
CRITICAL VALUE ALERT  Critical Value:  Lactic Acid 4.7  Date & Time Notied:  08/24/2016  0355  Provider Notified: Dr. Hal Hope  Orders Received/Actions taken: pending presently has ns at 156mls/hr

## 2017-08-24 NOTE — Progress Notes (Signed)
I agree with HPI and A/p as per Dr. Hal Hope from earlier today 12/28  57 male known Polysubstance abuse, ?htn Admit with lactic acidosis and emphysematous changes on CT--no focal source infection Echo ordered Ativan seemed to help the most-CIWA only 2  States now is having some CP-central with radiation up into the chest-burning-worse on moving around, been happeneing for the past 3 weeks  Note troponins are neg, EKG from admit L atrial enlargement, ?bi-atrial enlargement  p Observe today Give GI-cocktail-if no reliefe would rpt EKG Lactic acid trending down Keep on CIWA-states he drinks 4 shots daily suspect is more As is a smoker may need to risk stratify this admit   Verneita Griffes, MD [facility] ([phone]

## 2017-08-24 NOTE — Progress Notes (Signed)
Daughter into visit this shift.  IVF's continue to infuse via left arm PIV.  No signs or symptoms of infiltration noted.  No tremors or agitation noted.  Hydralazine given per prn order for elevated blood pressure.  Will monitor effectiveness.  No voiced complaints of pain or distress.  Will continue to monitor.

## 2017-08-24 NOTE — Progress Notes (Signed)
  Echocardiogram 2D Echocardiogram has been performed.  Cyd Hostler T Masaichi Kracht 08/24/2017, 1:53 PM

## 2017-08-24 NOTE — Progress Notes (Signed)
Initial Nutrition Assessment  DOCUMENTATION CODES:   Non-severe (moderate) malnutrition in context of chronic illness  INTERVENTION:    Ensure Enlive po BID, each supplement provides 350 kcal and 20 grams of protein  NUTRITION DIAGNOSIS:   Moderate Malnutrition related to chronic illness(polysubstance abuse) as evidenced by mild muscle depletion, mild fat depletion, energy intake < or equal to 75% for > or equal to 1 month, 11% weight loss  GOAL:   Patient will meet greater than or equal to 90% of their needs  MONITOR:   PO intake, Supplement acceptance, Labs, Weight trends, Skin, I & O's  REASON FOR ASSESSMENT:   Malnutrition Screening Tool  ASSESSMENT:   61 y.o. male with history of polysubstance abuse who has not been to a physician for many years and was previously told that he may have high blood pressure presents to the ER because of progressive exertional shortness of breath for the last 3 weeks.  RD visited with pt today. Reports his appetite is getting better.  PTA he was not eating as well. Was consuming smaller food portions. Admits to ETOH abuse. Also endorses a 20 lb unintentional weight loss in the last 3-4 weeks. Severe for time frame.  He is amenable to trying Ensure Enlive nutrition supplements during hospitalization. Medications include thiamine, MVI and folic acid. Labs reviewed. CBG's 168-108.  NUTRITION - FOCUSED PHYSICAL EXAM:    Most Recent Value  Orbital Region  No depletion  Upper Arm Region  Mild depletion  Thoracic and Lumbar Region  Mild depletion  Buccal Region  Mild depletion  Temple Region  Mild depletion  Clavicle Bone Region  Mild depletion  Clavicle and Acromion Bone Region  Mild depletion  Scapular Bone Region  Unable to assess  Dorsal Hand  Unable to assess  Patellar Region  Mild depletion  Anterior Thigh Region  Mild depletion  Posterior Calf Region  Mild depletion  Edema (RD Assessment)  None     Diet Order:  Diet Heart  Room service appropriate? Yes; Fluid consistency: Thin  EDUCATION NEEDS:   No education needs have been identified at this time  Skin:  Skin Assessment: Reviewed RN Assessment  Last BM:  12/28  Height:   Ht Readings from Last 1 Encounters:  08/23/17 6\' 3"  (1.905 m)   Weight:   Wt Readings from Last 1 Encounters:  08/24/17 161 lb 6 oz (73.2 kg)   Ideal Body Weight:  89 kg  BMI:  Body mass index is 20.17 kg/m.  Estimated Nutritional Needs:   Kcal:  2100-2300  Protein:  105-120 gm  Fluid:  2.1-2.3 L  Arthur Holms, RD, LDN Pager #: [phone] After-Hours Pager #: [phone]

## 2017-08-24 NOTE — Progress Notes (Signed)
Patient complained earlier in shift of tremors patient given Ativan per prn order.  When this nurse went back into see if the Ativan was effective the patient stated, "I feel much better."  Will continue to monitor patient for safety and distress.

## 2017-08-25 DIAGNOSIS — E440 Moderate protein-calorie malnutrition: Secondary | ICD-10-CM

## 2017-08-25 LAB — CBC
HEMATOCRIT: 29 % — AB (ref 39.0–52.0)
HEMOGLOBIN: 9.5 g/dL — AB (ref 13.0–17.0)
MCH: 32.2 pg (ref 26.0–34.0)
MCHC: 32.8 g/dL (ref 30.0–36.0)
MCV: 98.3 fL (ref 78.0–100.0)
Platelets: 123 10*3/uL — ABNORMAL LOW (ref 150–400)
RBC: 2.95 MIL/uL — ABNORMAL LOW (ref 4.22–5.81)
RDW: 18.1 % — ABNORMAL HIGH (ref 11.5–15.5)
WBC: 8.1 10*3/uL (ref 4.0–10.5)

## 2017-08-25 LAB — COMPREHENSIVE METABOLIC PANEL
ALBUMIN: 3.1 g/dL — AB (ref 3.5–5.0)
ALT: 17 U/L (ref 17–63)
ANION GAP: 7 (ref 5–15)
AST: 52 U/L — ABNORMAL HIGH (ref 15–41)
Alkaline Phosphatase: 50 U/L (ref 38–126)
BILIRUBIN TOTAL: 0.7 mg/dL (ref 0.3–1.2)
BUN: <5 mg/dL — ABNORMAL LOW (ref 6–20)
CO2: 23 mmol/L (ref 22–32)
Calcium: 8.5 mg/dL — ABNORMAL LOW (ref 8.9–10.3)
Chloride: 107 mmol/L (ref 101–111)
Creatinine, Ser: 0.84 mg/dL (ref 0.61–1.24)
GFR calc Af Amer: >60 mL/min (ref >60)
GFR calc non Af Amer: >60 mL/min (ref >60)
GLUCOSE: 94 mg/dL (ref 65–99)
POTASSIUM: 3.1 mmol/L — AB (ref 3.5–5.1)
SODIUM: 137 mmol/L (ref 135–145)
Total Protein: 5.6 g/dL — ABNORMAL LOW (ref 6.5–8.1)

## 2017-08-25 LAB — PROCALCITONIN: Procalcitonin: 0.12 ng/mL

## 2017-08-25 LAB — LACTIC ACID, PLASMA
LACTIC ACID, VENOUS: 2.7 mmol/L — AB (ref 0.5–1.9)
Lactic Acid, Venous: 1.2 mmol/L (ref 0.5–1.9)

## 2017-08-25 NOTE — Progress Notes (Signed)
Hospitalist progress note         Seth [name]  XBJ:[id number] DOB: [birth year] DOA: 08/23/2017 PCP: Patient, No Pcp Per   Specialists:   Brief Narrative:   35 male known Polysubstance abuse, ?htn Admit with lactic acidosis and emphysematous changes on CT--no focal source infection Echo ordered Ativan seemed to help the most-CIWA only 2    Assessment & Plan:   Assessment:  The primary encounter diagnosis was COPD exacerbation ([place]). A diagnosis of Tachycardia was also pertinent to this visit.    Diarrhea-unclear cause-has had 2 loose stool-consider check for Cdiff if recurs Lactic acidosis-unclear cause-UC and BC x2 neg so far. Lactic trending down-get PCT and narrow Abx azithro/rocephin Central CP-GI cocktail given-recolved-non cardiac Mod drinker-CIWA overnight only 0-2.  Will monitor another day on protocol-can be sent to med-surg     DVT prophylaxis: lovenox Code Status: full Family Communication:  Sister Disposition Plan: send to med surg-needing upto 48 hr more   Consultants:    none  Procedures:   noen  Antimicrobials:   none   Subjective: lools well Eating 50 % 2 stools today No cp No vomit Some pain in lower back  Objective: Vitals:   08/25/17 0409 08/25/17 0500 08/25/17 0810 08/25/17 0816  BP: (!) 143/98 (!) 172/108 (!) 163/100 (!) 157/96  Pulse: (!) 106 100 93 93  Resp: (!) 26 (!) 24 15 (!) 22  Temp: 98.6 F (37 C)  98.5 F (36.9 C)   TempSrc:   Oral   SpO2: 100% 100% 100% 100%  Weight:      Height:        Intake/Output Summary (Last 24 hours) at 08/25/2017 1012 Last data filed at 08/25/2017 0814 Gross per 24 hour  Intake 2761.25 ml  Output 4375 ml  Net -1613.75 ml   Filed Weights   08/23/17 2037 08/24/17 0343 08/25/17 0403  Weight: 72.1 kg (158 lb 15.2 oz) 73.2 kg (161 lb 6 oz) 74.7 kg (164 lb 10.9 oz)    Examination:  eomi ncat No tremors cta b abd soft nt nd no rebound No le edema Awake alert pleasant in nad Neuro  intact moves all 4 limbs  Data Reviewed: I have personally reviewed following labs and imaging studies  CBC: Recent Labs  Lab 08/23/17 1237 08/24/17 0251 08/25/17 0754  WBC 6.4 6.6 8.1  HGB 12.4* 9.2* 9.5*  HCT 37.5* 28.2* 29.0*  MCV 98.4 98.6 98.3  PLT 180 147* 308*   Basic Metabolic Panel: Recent Labs  Lab 08/23/17 1237 08/24/17 0251 08/25/17 0754  NA 135 135 137  K 4.5 3.7 3.1*  CL 99* 103 107  CO2 20* 16* 23  GLUCOSE 108* 168* 94  BUN 10 5* <5*  CREATININE 1.07 1.01 0.84  CALCIUM 9.8 8.4* 8.5*   GFR: Estimated Creatinine Clearance: 100 mL/min (by C-G formula based on SCr of 0.84 mg/dL). Liver Function Tests: Recent Labs  Lab 08/23/17 1245 08/25/17 0754  AST 34 52*  ALT 14* 17  ALKPHOS 74 50  BILITOT 0.7 0.7  PROT 8.0 5.6*  ALBUMIN 4.4 3.1*   No results for input(s): LIPASE, AMYLASE in the last 168 hours. No results for input(s): AMMONIA in the last 168 hours. Coagulation Profile: No results for input(s): INR, PROTIME in the last 168 hours. Cardiac Enzymes: Recent Labs  Lab 08/23/17 2155 08/24/17 0251 08/24/17 0741  TROPONINI <0.03 <0.03 <0.03   CBG: No results for input(s): GLUCAP in the last 168 hours. Urine analysis:  Component Value Date/Time   COLORURINE YELLOW 08/23/2017 1530   APPEARANCEUR CLEAR 08/23/2017 1530   LABSPEC 1.004 (L) 08/23/2017 1530   PHURINE 6.0 08/23/2017 1530   GLUCOSEU NEGATIVE 08/23/2017 1530   HGBUR SMALL (A) 08/23/2017 1530   BILIRUBINUR NEGATIVE 08/23/2017 1530   KETONESUR NEGATIVE 08/23/2017 1530   PROTEINUR NEGATIVE 08/23/2017 1530   NITRITE NEGATIVE 08/23/2017 [place] 08/23/2017 1530     Radiology Studies: Reviewed images personally in health database    Scheduled Meds: . enoxaparin (LOVENOX) injection  40 mg Subcutaneous Q24H  . feeding supplement (ENSURE ENLIVE)  237 mL Oral BID BM  . folic acid  1 mg Oral Daily  . LORazepam  0-4 mg Oral Q6H   Followed by  . LORazepam  0-4  mg Oral Q12H  . multivitamin with minerals  1 tablet Oral Daily  . thiamine  100 mg Oral Daily   Or  . thiamine  100 mg Intravenous Daily   Continuous Infusions: . sodium chloride 125 mL/hr at 08/25/17 0615  . azithromycin Stopped (08/24/17 1550)  . cefTRIAXone (ROCEPHIN)  IV Stopped (08/24/17 1517)     LOS: 2 days    Time spent: [place], MD [facility]   If 7PM-7AM, please contact night-coverage [email or web address] Password [email or web address] 08/25/2017, 10:12 AM

## 2017-08-25 NOTE — Progress Notes (Signed)
Pt transfer from 5M alert and oriented ,ambulatory, room air, on continuous IVF, discontinued telemetry, no complain of pain.

## 2017-08-25 NOTE — Progress Notes (Signed)
Patient transferring to 6N. Report called to receiving nurse. All questions answered. Patient informed family of transfer. All belongings transferred with patient.

## 2017-08-25 NOTE — Plan of Care (Signed)
  Clinical Measurements: Will remain free from infection 08/25/2017 2023 - Progressing by Irish Lack, RN   Health Behavior/Discharge Planning: Ability to manage health-related needs will improve 08/25/2017 2023 - Progressing by Irish Lack, RN

## 2017-08-25 NOTE — Progress Notes (Signed)
BP elevated. PRN BP med given. BP now 157/99.

## 2017-08-26 LAB — PROCALCITONIN: Procalcitonin: <0.1 ng/mL

## 2017-08-26 LAB — C DIFFICILE QUICK SCREEN W PCR REFLEX
C Diff antigen: NEGATIVE
C Diff interpretation: NOT DETECTED
C Diff toxin: NEGATIVE

## 2017-08-26 NOTE — Progress Notes (Signed)
Pt is feeling better, no SOB. Non-productive cough better. Discharge instructions given to pt, discharged to home accompanied by family.

## 2017-08-26 NOTE — Discharge Summary (Signed)
Physician Discharge Summary  Seth [name] YIR:[id number] DOB: 02-09-1956 DOA: 08/23/2017  PCP: Patient, No Pcp Per  Admit date: 08/23/2017 Discharge date: 08/26/2017  Time spent: 25 minutes  Recommendations for Outpatient Follow-up:  1. Recommend OP preventive follow up and cessation of ETOH   Discharge Diagnoses:  Principal Problem:   Acute respiratory failure with hypoxia (HCC) Active Problems:   Chest pain   Elevated blood pressure reading   Tobacco abuse   COPD exacerbation (HCC)   Malnutrition of moderate degree   Discharge Condition: gaurded  Diet recommendation: hh  Filed Weights   08/24/17 0343 08/25/17 0403 08/25/17 1628  Weight: 73.2 kg (161 lb 6 oz) 74.7 kg (164 lb 10.9 oz) 71.7 kg (158 lb)    History of present illness:  46 male known Polysubstance abuse, ?htn Admit with lactic acidosis and emphysematous changes on CT--no focal source infection Echo ordered Ativan seemed to help the most-CIWA only 2    Hospital Course:  Diarrhea-unclear cause-has had 2 loose stool-stool checked for Cdfii and neg--patiet non-toxic on d/c home Lactic acidosis-unclear cause-UC and BC x2 neg so far. Lactic trending down-get PCT and did d/c IV abx  Prior to d/c as no further concern for sepsis-PCT was < 0.10 Central CP-GI cocktail given-recolved-non cardiac Mod drinker-CIWA overnight only 0-2. stabilized on d/c  Discharge Exam: Vitals:   08/26/17 0600 08/26/17 0700  BP: (!) 166/88 (!) 150/79  Pulse:    Resp:    Temp:    SpO2:      General: eomim ncat Cardiovascular:  s1 s 2no m/r/g Respiratory: clear no adde dsoudn abd soft nt nd no rebound   Discharge Instructions   Discharge Instructions    Diet - low sodium heart healthy   Complete by:  As directed    Discharge instructions   Complete by:  As directed    We did not find any clear source for your shortness of breath or your fever but feel you are non toxic and non-infectious--your diarrhea was likely  related to use of antibiotics and has been tested and this should subside on its own Would follow up with your primary MD as an outpatient for further preventive care   Increase activity slowly   Complete by:  As directed      Allergies as of 08/26/2017   No Known Allergies     Medication List    STOP taking these medications   HYDROcodone-homatropine 5-1.5 MG/5ML syrup Commonly known as:  HYCODAN   ibuprofen 600 MG tablet Commonly known as:  ADVIL,MOTRIN   indomethacin 50 MG capsule Commonly known as:  INDOCIN   meloxicam 15 MG tablet Commonly known as:  MOBIC   methocarbamol 500 MG tablet Commonly known as:  ROBAXIN   naproxen 500 MG tablet Commonly known as:  NAPROSYN   oxyCODONE 5 MG immediate release tablet Commonly known as:  ROXICODONE     TAKE these medications   acetaminophen 500 MG tablet Commonly known as:  TYLENOL Take 500-1,000 mg by mouth every 6 (six) hours as needed (for headaches).   albuterol 108 (90 Base) MCG/ACT inhaler Commonly known as:  PROVENTIL HFA;VENTOLIN HFA Inhale 2 puffs into the lungs every 4 (four) hours as needed for wheezing or shortness of breath.   benzonatate 100 MG capsule Commonly known as:  TESSALON Take 1 capsule (100 mg total) by mouth every 8 (eight) hours.      No Known Allergies    The results of significant diagnostics from  this hospitalization (including imaging, microbiology, ancillary and laboratory) are listed below for reference.    Significant Diagnostic Studies: Dg Chest 2 View  Result Date: 08/23/2017 CLINICAL DATA:  Shortness of breath, chest pain, and cough. EXAM: CHEST  2 VIEW COMPARISON:  Of of 03/16/2016 FINDINGS: The heart size and mediastinal contours are within normal limits. Aortic atherosclerosis. Both lungs are clear. Chronic blunting of the right costophrenic angle laterally. Old fracture of the posterolateral aspect of the left ninth rib. IMPRESSION: No active cardiopulmonary disease.  Electronically Signed   By: Lorriane Shire M.D.   On: 08/23/2017 13:09   Ct Angio Chest Pe W And/or Wo Contrast  Result Date: 08/23/2017 CLINICAL DATA:  Chest pain, shortness of breath and weakness for several weeks, increased symptoms, now cannot walk from one room to the next without chest pain and significant shortness of breath, smoker EXAM: CT ANGIOGRAPHY CHEST WITH CONTRAST TECHNIQUE: Multidetector CT imaging of the chest was performed using the standard protocol during bolus administration of intravenous contrast. Multiplanar CT image reconstructions and MIPs were obtained to evaluate the vascular anatomy. CONTRAST:  1102mL ISOVUE-370 IOPAMIDOL (ISOVUE-370) INJECTION 76% IV COMPARISON:  None FINDINGS: Cardiovascular: Atherosclerotic calcifications aorta, coronary arteries and proximal great vessels. Aorta dilated 4.1 cm diameter at distal ascending aorta. No evidence of dissection. Pulmonary artery is adequately opacified and patent. No evidence of pulmonary embolism. No pericardial effusion. Mediastinum/Nodes: Esophagus normal appearance. Base of cervical region normal appearance. No thoracic adenopathy. Lungs/Pleura: Nodular density subpleural at posteromedial RIGHT upper lobe 6 mm diameter image 31 diameter stable in size but now abutting pleural surface. Dependent atelectasis in the posterior lower lobes. Mild underlying emphysematous changes. No acute infiltrate, pleural effusion or pneumothorax. Upper Abdomen: Small cyst at posterior aspect of upper pole RIGHT kidney unchanged. Diverticulosis of proximal descending colon. Remaining visualized upper abdomen unremarkable. Musculoskeletal: No acute osseous findings. Review of the MIP images confirms the above findings. IMPRESSION: No evidence pulmonary embolism. Stable 6 mm RIGHT upper lobe nodule. Mild emphysematous changes without infiltrate. Aortic Atherosclerosis (ICD10-I70.0). Emphysema (ICD10-J43.9). Electronically Signed   By: Lavonia Dana M.D.    On: 08/23/2017 19:31    Microbiology: Recent Results (from the past 240 hour(s))  Culture, blood (Routine X 2) w Reflex to ID Panel     Status: None (Preliminary result)   Collection Time: 08/23/17  1:46 PM  Result Value Ref Range Status   Specimen Description BLOOD LEFT HAND  Final   Special Requests   Final    IN BOTH AEROBIC AND ANAEROBIC BOTTLES Blood Culture adequate volume   Culture NO GROWTH 2 DAYS  Final   Report Status PENDING  Incomplete  Culture, blood (Routine X 2) w Reflex to ID Panel     Status: None (Preliminary result)   Collection Time: 08/23/17  1:46 PM  Result Value Ref Range Status   Specimen Description BLOOD BLOOD RIGHT FOREARM  Final   Special Requests IN PEDIATRIC BOTTLE Blood Culture adequate volume  Final   Culture NO GROWTH 2 DAYS  Final   Report Status PENDING  Incomplete  Urine culture     Status: None   Collection Time: 08/23/17  3:30 PM  Result Value Ref Range Status   Specimen Description URINE, CLEAN CATCH  Final   Special Requests NONE  Final   Culture NO GROWTH  Final   Report Status 08/24/2017 FINAL  Final  MRSA PCR Screening     Status: None   Collection Time: 08/23/17  8:56 PM  Result Value Ref Range Status   MRSA by PCR NEGATIVE NEGATIVE Final    Comment:        The GeneXpert MRSA Assay (FDA approved for NASAL specimens only), is one component of a comprehensive MRSA colonization surveillance program. It is not intended to diagnose MRSA infection nor to guide or monitor treatment for MRSA infections.   C difficile quick scan w PCR reflex     Status: None   Collection Time: 08/26/17  9:30 AM  Result Value Ref Range Status   C Diff antigen NEGATIVE NEGATIVE Final   C Diff toxin NEGATIVE NEGATIVE Final   C Diff interpretation No C. difficile detected.  Final     Labs: Basic Metabolic Panel: Recent Labs  Lab 08/23/17 1237 08/24/17 0251 08/25/17 0754  NA 135 135 137  K 4.5 3.7 3.1*  CL 99* 103 107  CO2 20* 16* 23  GLUCOSE  108* 168* 94  BUN 10 5* <5*  CREATININE 1.07 1.01 0.84  CALCIUM 9.8 8.4* 8.5*   Liver Function Tests: Recent Labs  Lab 08/23/17 1245 08/25/17 0754  AST 34 52*  ALT 14* 17  ALKPHOS 74 50  BILITOT 0.7 0.7  PROT 8.0 5.6*  ALBUMIN 4.4 3.1*   No results for input(s): LIPASE, AMYLASE in the last 168 hours. No results for input(s): AMMONIA in the last 168 hours. CBC: Recent Labs  Lab 08/23/17 1237 08/24/17 0251 08/25/17 0754  WBC 6.4 6.6 8.1  HGB 12.4* 9.2* 9.5*  HCT 37.5* 28.2* 29.0*  MCV 98.4 98.6 98.3  PLT 180 147* 123*   Cardiac Enzymes: Recent Labs  Lab 08/23/17 2155 08/24/17 0251 08/24/17 0741  TROPONINI <0.03 <0.03 <0.03   BNP: BNP (last 3 results) Recent Labs    08/23/17 2155  BNP 31.4    ProBNP (last 3 results) No results for input(s): PROBNP in the last 8760 hours.  CBG: No results for input(s): GLUCAP in the last 168 hours.     Signed:  Nita Sells MD   [facility] 08/26/2017, 11:41 AM

## 2017-08-28 LAB — CULTURE, BLOOD (ROUTINE X 2)
CULTURE: NO GROWTH
Culture: NO GROWTH
Special Requests: ADEQUATE

## 2017-12-07 ENCOUNTER — Ambulatory Visit: Payer: Self-pay | Admitting: Internal Medicine

## 2017-12-07 ENCOUNTER — Encounter: Payer: Self-pay | Admitting: Internal Medicine

## 2017-12-07 VITALS — BP 150/110 | HR 88 | Resp 12 | Ht 72.0 in | Wt 161.0 lb

## 2017-12-07 DIAGNOSIS — Z8673 Personal history of transient ischemic attack (TIA), and cerebral infarction without residual deficits: Secondary | ICD-10-CM

## 2017-12-07 DIAGNOSIS — G319 Degenerative disease of nervous system, unspecified: Secondary | ICD-10-CM

## 2017-12-07 DIAGNOSIS — J441 Chronic obstructive pulmonary disease with (acute) exacerbation: Secondary | ICD-10-CM

## 2017-12-07 DIAGNOSIS — I10 Essential (primary) hypertension: Secondary | ICD-10-CM

## 2017-12-07 DIAGNOSIS — Z23 Encounter for immunization: Secondary | ICD-10-CM

## 2017-12-07 DIAGNOSIS — R634 Abnormal weight loss: Secondary | ICD-10-CM

## 2017-12-07 DIAGNOSIS — L853 Xerosis cutis: Secondary | ICD-10-CM

## 2017-12-07 DIAGNOSIS — R630 Anorexia: Secondary | ICD-10-CM

## 2017-12-07 MED ORDER — ALBUTEROL SULFATE HFA 108 (90 BASE) MCG/ACT IN AERS: 2.0000 | INHALATION_SPRAY | Freq: Four times a day (QID) | RESPIRATORY_TRACT | 2 refills | Status: DC | PRN

## 2017-12-07 MED ORDER — TIOTROPIUM BROMIDE MONOHYDRATE 18 MCG IN CAPS: ORAL_CAPSULE | RESPIRATORY_TRACT | 12 refills | Status: DC

## 2017-12-07 MED ORDER — VERAPAMIL HCL ER 120 MG PO TBCR: 120.0000 mg | EXTENDED_RELEASE_TABLET | Freq: Every day | ORAL | 11 refills | Status: DC
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
## 2017-12-07 NOTE — Patient Instructions (Signed)
Tobacco Cessation:   1800QUITNOW or [phone], the former for support and possibly free nicotine patches/gum and support; the latter for [facility] Smoking cessation class. Get rid of all smoking supplies:  Cigarettes, lighters, ashtrays--no stashes just in case at home if you are serious.  Recommend nicotine patches:  21 mg to skin and change daily for 28 days, then down to 14 mg patches for 14 days, then down to 7 mg patches for 14 days, then stop. Get rid of all cigarettes, lighters, ash trays, smoking paraphernalia before start first patch  Stop drinking any alcohol  Encourage you to work with Wynn Banker and [place] on transportation, food access, figuring out [employer]. Your inhalers are there at the public health dept.  3rd floor in the [facility] Program and you blood pressure medication is at [facility] I will write for the gabapentin for pain once you are on the orange card.

## 2017-12-07 NOTE — Progress Notes (Signed)
Social Work Theatre manager completed new patient screening to assess for behavioral health or resource needs. Seth [name] displayed many depressive symptoms including anhedonia, feeling down and hopeless, fatigue, poor appetite, trouble with concentration, moving slowly and previous thoughts of suicide or self-harm. Seth [name] attributes his anxiety and feelings of daily stress to his physical health challenges and pain at this time including frequent headaches and trouble with balance. Seth [name] also reports a lack of appetite and losing over 30+ pounds over the past few months. SWI completed risk assessment to assess for any current suicidal ideation and Seth [name] reports he has no current thoughts or plan but does often think of leaving [place] and changing aspects of his life. Seth [name] reports struggling with social determinants of health with the largest barriers being transportation and worry over housing and utility bills. SWI offered community resource guide and assessed Seth [name]'s interest in setting up counseling. Seth [name] stated he is interested in moving forward with counseling and LCSW will follow up with Seth [name] for on-going case management and assistance with applications for financial assistance as well as counseling services.

## 2017-12-08 LAB — COMPREHENSIVE METABOLIC PANEL
ALT: 15 IU/L (ref 0–44)
AST: 74 IU/L — AB (ref 0–40)
Albumin/Globulin Ratio: 1.9 (ref 1.2–2.2)
Albumin: 4.5 g/dL (ref 3.6–4.8)
Alkaline Phosphatase: 69 IU/L (ref 39–117)
BILIRUBIN TOTAL: 0.4 mg/dL (ref 0.0–1.2)
BUN/Creatinine Ratio: 14 (ref 10–24)
BUN: 13 mg/dL (ref 8–27)
CALCIUM: 9.2 mg/dL (ref 8.6–10.2)
CHLORIDE: 103 mmol/L (ref 96–106)
CO2: 21 mmol/L (ref 20–29)
Creatinine, Ser: 0.9 mg/dL (ref 0.76–1.27)
GFR calc Af Amer: 107 mL/min/{1.73_m2} (ref >59)
GFR, EST NON AFRICAN AMERICAN: 93 mL/min/{1.73_m2} (ref >59)
GLUCOSE: 70 mg/dL (ref 65–99)
Globulin, Total: 2.4 g/dL (ref 1.5–4.5)
POTASSIUM: 4.9 mmol/L (ref 3.5–5.2)
Sodium: 144 mmol/L (ref 134–144)
TOTAL PROTEIN: 6.9 g/dL (ref 6.0–8.5)

## 2017-12-08 LAB — CBC WITH DIFFERENTIAL/PLATELET
BASOS ABS: 0.1 10*3/uL (ref 0.0–0.2)
Basos: 1 %
EOS (ABSOLUTE): 0.1 10*3/uL (ref 0.0–0.4)
Eos: 2 %
Hematocrit: 36.1 % — ABNORMAL LOW (ref 37.5–51.0)
Hemoglobin: 12.1 g/dL — ABNORMAL LOW (ref 13.0–17.7)
IMMATURE GRANS (ABS): 0 10*3/uL (ref 0.0–0.1)
IMMATURE GRANULOCYTES: 0 %
LYMPHS: 33 %
Lymphocytes Absolute: 1.5 10*3/uL (ref 0.7–3.1)
MCH: 36.7 pg — ABNORMAL HIGH (ref 26.6–33.0)
MCHC: 33.5 g/dL (ref 31.5–35.7)
MCV: 109 fL — ABNORMAL HIGH (ref 79–97)
MONOS ABS: 0.6 10*3/uL (ref 0.1–0.9)
Monocytes: 12 %
NEUTROS PCT: 52 %
Neutrophils Absolute: 2.3 10*3/uL (ref 1.4–7.0)
PLATELETS: 252 10*3/uL (ref 150–379)
RBC: 3.3 x10E6/uL — ABNORMAL LOW (ref 4.14–5.80)
RDW: 17.8 % — AB (ref 12.3–15.4)
WBC: 4.5 10*3/uL (ref 3.4–10.8)

## 2017-12-08 LAB — TSH: TSH: 0.804 u[IU]/mL (ref 0.450–4.500)

## 2017-12-25 ENCOUNTER — Ambulatory Visit: Payer: Self-pay

## 2017-12-25 VITALS — BP 126/78 | HR 78 | Wt 162.0 lb

## 2017-12-25 DIAGNOSIS — I10 Essential (primary) hypertension: Secondary | ICD-10-CM

## 2017-12-25 DIAGNOSIS — D539 Nutritional anemia, unspecified: Secondary | ICD-10-CM

## 2017-12-25 NOTE — Progress Notes (Signed)
Patient BP no in normal range. Informed to continue current dose of medication. Patient verbalized understanding.

## 2017-12-26 LAB — VITAMIN B12: VITAMIN B 12: 245 pg/mL (ref 232–1245)

## 2017-12-26 LAB — FOLATE: Folate: <1.5 ng/mL — ABNORMAL LOW (ref >3.0)

## 2017-12-31 ENCOUNTER — Other Ambulatory Visit: Payer: Self-pay | Admitting: Internal Medicine

## 2017-12-31 MED ORDER — VITAMIN B-12 1000 MCG PO TABS: 1000.0000 ug | ORAL_TABLET | Freq: Every day | ORAL | Status: DC

## 2017-12-31 MED ORDER — FOLIC ACID 1 MG PO TABS: 1.0000 mg | ORAL_TABLET | Freq: Every day | ORAL | Status: DC

## 2018-01-01 ENCOUNTER — Other Ambulatory Visit: Payer: Self-pay | Admitting: Licensed Clinical Social Worker

## 2018-01-04 ENCOUNTER — Ambulatory Visit: Payer: Self-pay | Admitting: Internal Medicine

## 2018-01-07 ENCOUNTER — Ambulatory Visit: Payer: Self-pay | Admitting: Internal Medicine

## 2018-01-07 ENCOUNTER — Encounter: Payer: Self-pay | Admitting: Internal Medicine

## 2018-01-07 VITALS — BP 180/98 | HR 84 | Resp 12 | Ht 72.0 in | Wt 160.0 lb

## 2018-01-07 DIAGNOSIS — G319 Degenerative disease of nervous system, unspecified: Secondary | ICD-10-CM

## 2018-01-07 DIAGNOSIS — R630 Anorexia: Secondary | ICD-10-CM

## 2018-01-07 DIAGNOSIS — D539 Nutritional anemia, unspecified: Secondary | ICD-10-CM

## 2018-01-07 DIAGNOSIS — R634 Abnormal weight loss: Secondary | ICD-10-CM

## 2018-01-07 DIAGNOSIS — E440 Moderate protein-calorie malnutrition: Secondary | ICD-10-CM

## 2018-01-07 DIAGNOSIS — I10 Essential (primary) hypertension: Secondary | ICD-10-CM

## 2018-01-07 DIAGNOSIS — L853 Xerosis cutis: Secondary | ICD-10-CM

## 2018-01-07 DIAGNOSIS — F1020 Alcohol dependence, uncomplicated: Secondary | ICD-10-CM

## 2018-01-07 DIAGNOSIS — Z720 Tobacco use: Secondary | ICD-10-CM

## 2018-01-07 NOTE — Patient Instructions (Signed)
Need to get a pill box with doors for AM and PM.

## 2018-01-07 NOTE — Progress Notes (Signed)
   Subjective:    Patient ID: Seth [name], male    DOB: [birth year], 62 y.o.   MRN: [id number]  HPI   1.  Essential Hypertension:  Has missed his Verapamil a couple of times at home.  He does not have a pill box.  Headache was better with bp controlled 2 weeks ago.    2.  Alcohol abuse:  Drinking half pint of whiskey daily.  Has not rescheduled cancelled appt with Macie Burows, LCSW.   He has been through 12 step program without success about 10 years ago. Was drinking a lot more then.   He gets bored and so drinks more then.    3.  COPD:  Never obtained orange card so he could go to [facility] for some meds.  Discussed does not need this for the Spiriva and Albuterol however.  Rxs sent to [facility] a month ago.  He has not gone there to apply.   He is down to 1/2 ppd from a whole pack one month ago.    4.  Weight loss/macrocytic anemia:  Was eating better when returned for bp check 2 weeks ago.  Now not eating as much.  Suspect poor nutrition with his alcohol intake and both folic acid low as well as borderline low B12. Did not pick these B vitamins up after last nurse visit as requested  Current Meds  Medication Sig  . ibuprofen (ADVIL,MOTRIN) 200 MG tablet Take 200 mg by mouth every 6 (six) hours as needed.  . verapamil (CALAN-SR) 120 MG CR tablet Take 1 tablet (120 mg total) by mouth at bedtime.     Review of Systems     Objective:   Physical Exam Skin:  Generalized dryness, including palms, but better. Smells of cigarette smoke. Lungs:  CTA CV:  RRR without murmur or rub, a bit tachycardic.  Radial and DP pulses normal and equal Abd:  S NT, No HSM or mass, + BS LE:  No edema.       Assessment & Plan:  1.  COPD:  Needs to go to PHD and start application for inhalers.  Discussed process of application.  Paperwork given with instructions as well.  2.  Essential Hypertension;  Needs pill box to remember meds.  His bp was well controlled with short followup on start of Verapamil  previously.  He has 7 days left of med based on what he remembers looking at his pill bottle today, which means he has missed a week's worth of medication over the past month.  3.  Macrocytic anemia:  Has not started G25 and Folic acid.  Have this on his paperwork med list to show pharmacy when he goes to help find this on the shelves.  4.  Appetite and weight loss:  Needs work with LCSW for alcohol cessation as well as continuing with tobacco cessation. Not clear how much of this is due to his alcohol intake and lack of healthy diet vs other.

## 2018-01-17 ENCOUNTER — Ambulatory Visit: Payer: Self-pay | Admitting: Licensed Clinical Social Worker

## 2018-01-17 DIAGNOSIS — F1020 Alcohol dependence, uncomplicated: Secondary | ICD-10-CM

## 2018-01-22 ENCOUNTER — Ambulatory Visit: Payer: Self-pay

## 2018-01-22 VITALS — BP 200/100 | HR 80

## 2018-01-22 DIAGNOSIS — I10 Essential (primary) hypertension: Secondary | ICD-10-CM

## 2018-01-22 NOTE — Progress Notes (Signed)
Per Dr. Amil Amen have patient increase to 2 tabs daily. Spoke with patient and verbalized understanding. Patient scheduled for BP check next Tuesday @ 10 am.
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
## 2018-01-30 ENCOUNTER — Other Ambulatory Visit: Payer: Self-pay | Admitting: Licensed Clinical Social Worker

## 2018-02-08 ENCOUNTER — Ambulatory Visit: Payer: Self-pay | Admitting: Internal Medicine

## 2018-02-18 ENCOUNTER — Other Ambulatory Visit: Payer: Self-pay | Admitting: Licensed Clinical Social Worker

## 2018-02-28 ENCOUNTER — Encounter: Payer: Self-pay | Admitting: Internal Medicine

## 2018-03-01 ENCOUNTER — Ambulatory Visit: Payer: Self-pay | Admitting: Internal Medicine

## 2018-03-04 ENCOUNTER — Telehealth: Payer: Self-pay | Admitting: Internal Medicine

## 2018-03-04 MED ORDER — UMECLIDINIUM BROMIDE 62.5 MCG/INH IN AEPB: 1.0000 | INHALATION_SPRAY | Freq: Every day | RESPIRATORY_TRACT | 11 refills | Status: DC

## 2018-03-04 MED ORDER — VERAPAMIL HCL ER 120 MG PO TBCR: EXTENDED_RELEASE_TABLET | ORAL | 11 refills | Status: DC

## 2018-03-04 NOTE — Telephone Encounter (Signed)
Phone call from MAP at [facility]. Recommended switching to InCruse Ellipta as able to keep better track of this for patients. The Spiriva is shipped directly to patient.

## 2018-03-19 ENCOUNTER — Other Ambulatory Visit: Payer: Self-pay | Admitting: Licensed Clinical Social Worker

## 2018-03-21 ENCOUNTER — Ambulatory Visit: Payer: Self-pay | Admitting: Licensed Clinical Social Worker

## 2018-03-21 DIAGNOSIS — F1020 Alcohol dependence, uncomplicated: Secondary | ICD-10-CM

## 2018-03-21 DIAGNOSIS — Z720 Tobacco use: Secondary | ICD-10-CM

## 2018-03-25 NOTE — Progress Notes (Signed)
   THERAPY PROGRESS NOTE  Session Time: 19min  Participation Level: Active  Behavioral Response: CasualAlertEuthymic  Type of Therapy: Individual Therapy  Treatment Goals addressed: Coping  Interventions: Motivational Interviewing and Supportive  Summary: Seth [name] is a 62 y.o. male who presents with a euthymic mood and appropriate affect. He reported that despite missing several appointments, he feels committed to the process of quitting alcohol. He expressed that quitting both alcohol and cigarettes feels too hard all at once, so he would like to stop drinking first. He described an effort to quit, when he stopped drinking for four days and noticed that he had the shakes. He described the motivation to continue trying to quit, which includes his health, as he has continued to lose weight due to drinking. He acknowledged that his friendships are the biggest motivator for continuing to drink, and that he might have to distance himself from some of his friends. He reported that he has some friends that don't drink, and he will reach out to them.  Suicidal/Homicidal: Nowithout intent/plan  Therapist Response: LCSW utilized supportive counseling techniques throughout the session in order to validate emotions and encourage open expression of emotion. LCSW asked Seth [name] to identify his motivators in quitting alcohol. LCSW and Seth [name] processed about what his life could be like without drinking. LCSW asked Seth [name] to identify barriers to quitting, and brainstormed around how to overcome them.  Plan: Return again in 2 weeks.    Metta Clines, LCSW 03/25/2018

## 2018-03-28 ENCOUNTER — Other Ambulatory Visit: Payer: Self-pay | Admitting: Licensed Clinical Social Worker

## 2018-04-03 ENCOUNTER — Other Ambulatory Visit: Payer: Self-pay | Admitting: Licensed Clinical Social Worker

## 2018-04-19 ENCOUNTER — Ambulatory Visit: Payer: Self-pay | Admitting: Internal Medicine

## 2018-05-08 ENCOUNTER — Encounter: Payer: Self-pay | Admitting: Internal Medicine

## 2018-05-08 ENCOUNTER — Ambulatory Visit: Payer: Self-pay | Admitting: Internal Medicine

## 2018-05-08 ENCOUNTER — Telehealth: Payer: Self-pay | Admitting: Licensed Clinical Social Worker

## 2018-05-08 VITALS — BP 180/90 | HR 94 | Resp 12 | Ht 72.0 in | Wt 154.0 lb

## 2018-05-08 DIAGNOSIS — J441 Chronic obstructive pulmonary disease with (acute) exacerbation: Secondary | ICD-10-CM

## 2018-05-08 DIAGNOSIS — F1020 Alcohol dependence, uncomplicated: Secondary | ICD-10-CM

## 2018-05-08 DIAGNOSIS — R634 Abnormal weight loss: Secondary | ICD-10-CM

## 2018-05-08 DIAGNOSIS — Z720 Tobacco use: Secondary | ICD-10-CM

## 2018-05-08 DIAGNOSIS — D539 Nutritional anemia, unspecified: Secondary | ICD-10-CM

## 2018-05-08 DIAGNOSIS — E440 Moderate protein-calorie malnutrition: Secondary | ICD-10-CM

## 2018-05-08 DIAGNOSIS — I10 Essential (primary) hypertension: Secondary | ICD-10-CM

## 2018-05-08 MED ORDER — AMLODIPINE BESYLATE 10 MG PO TABS: ORAL_TABLET | ORAL | 11 refills | Status: DC

## 2018-05-08 NOTE — Telephone Encounter (Signed)
LCSW called [facility] to schedule a session to address detox options. Left voicemail requesting a call back.

## 2018-05-08 NOTE — Progress Notes (Signed)
   Subjective:    Patient ID: Seth [name], male    DOB: 06/07/1956, 62 y.o.   MRN: [id number]  HPI   1.  Alcoholism:  Longest he has stopped drinking since last visit has been 4 days.  Looking at LCSW note, he got the shakes and started drinking again. He has never gone through withdrawal on his own before. Once he gets the shakes, about 4 days, he just starts drinking again. He is willing to go somewhere for detox. Was trying to go to AA, but ultimately fell through.  Still half a pint of vodka or more daily.  2.  Weight loss:  First meal generally around noon:  Yesterday had  3 small sausages and 2 eggs and water.   Snacked rest of day on pork rind skins, pear.  Drinks water 9-10 times daily--large glass.   He does feel he is urinating a lot.   He is not clear if he is always thirsty. Does get up at night 3 times to urinate.  Does have good flow, but sometimes not a lot of urine.  Urine is yellow, but not dark. At least 5 shots on average daily.  3.  Hypertension:  States he is taking his Verapamil daily.    4.  COPD:  Still smoking 1/2 ppd now.  Incruse Ellipta he uses daily.  States his cough is significantly improved with less sputum production as well.  Breathing better also.   Still has what he feels is posterior pharyngeal drainage he is coughing up.  5.  Anemia:  Taking S92 and folic acid.  Folic acid level was low in April.     Current Meds  Medication Sig  . folic acid (FOLVITE) 1 MG tablet Take 1 tablet (1 mg total) by mouth daily.  . Multiple Vitamin (MULTIVITAMIN) tablet Take 1 tablet by mouth daily.  Marland Kitchen umeclidinium bromide (INCRUSE ELLIPTA) 62.5 MCG/INH AEPB Inhale 1 puff into the lungs daily.  . vitamin B-12 (CYANOCOBALAMIN) 1000 MCG tablet Take 1 tablet (1,000 mcg total) by mouth daily.  . [DISCONTINUED] verapamil (CALAN-SR) 120 MG CR tablet 2 tabs by mouth at bedtime    No Known Allergies Review of Systems     Objective:   Physical Exam Cachectic  appearing.  Flaky, dry skin all over body, including palms Lungs: Barrel chest with good air movement. No wheezing or crackles CV: RRR without murmur or rub.  Radial and DP pulses normal and equal       Assessment & Plan:  1.  Alcoholism:  He agrees to work with Metta Clines, LCSW for inpatient detox and likely long term treatment.  2.  Malnutrition and weight loss/Anemia:  Likely secondary to ETOH.  Agrees to Z30 and folic acid and work on improving oral intake.  CBC, CMP, TSH  3.  Hypertension:  Add Amlodipine 5 mg daily.  Suspect will need to increase on follow up.  BP and pulse check 1 week.  4.  COPD:  Improved with Incruse.  5.  Tobacco Abuse:  Quitline information given again.

## 2018-05-09 LAB — COMPREHENSIVE METABOLIC PANEL
A/G RATIO: 1.6 (ref 1.2–2.2)
ALBUMIN: 4.2 g/dL (ref 3.6–4.8)
ALK PHOS: 175 IU/L — AB (ref 39–117)
ALT: 47 IU/L — ABNORMAL HIGH (ref 0–44)
AST: 128 IU/L — AB (ref 0–40)
BUN / CREAT RATIO: 9 — AB (ref 10–24)
BUN: 8 mg/dL (ref 8–27)
Bilirubin Total: 0.5 mg/dL (ref 0.0–1.2)
CHLORIDE: 97 mmol/L (ref 96–106)
CO2: 21 mmol/L (ref 20–29)
Calcium: 10.4 mg/dL — ABNORMAL HIGH (ref 8.6–10.2)
Creatinine, Ser: 0.88 mg/dL (ref 0.76–1.27)
GFR calc Af Amer: 108 mL/min/{1.73_m2} (ref >59)
GFR calc non Af Amer: 93 mL/min/{1.73_m2} (ref >59)
GLOBULIN, TOTAL: 2.6 g/dL (ref 1.5–4.5)
Glucose: 81 mg/dL (ref 65–99)
POTASSIUM: 5 mmol/L (ref 3.5–5.2)
SODIUM: 145 mmol/L — AB (ref 134–144)
Total Protein: 6.8 g/dL (ref 6.0–8.5)

## 2018-05-09 LAB — CBC WITH DIFFERENTIAL/PLATELET
Basophils Absolute: 0 10*3/uL (ref 0.0–0.2)
Basos: 0 %
EOS (ABSOLUTE): 0.2 10*3/uL (ref 0.0–0.4)
EOS: 3 %
HEMATOCRIT: 36.6 % — AB (ref 37.5–51.0)
HEMOGLOBIN: 12.1 g/dL — AB (ref 13.0–17.7)
Immature Grans (Abs): 0 10*3/uL (ref 0.0–0.1)
Immature Granulocytes: 0 %
Lymphocytes Absolute: 1.7 10*3/uL (ref 0.7–3.1)
Lymphs: 34 %
MCH: 32.9 pg (ref 26.6–33.0)
MCHC: 33.1 g/dL (ref 31.5–35.7)
MCV: 100 fL — ABNORMAL HIGH (ref 79–97)
MONOCYTES: 11 %
MONOS ABS: 0.6 10*3/uL (ref 0.1–0.9)
NEUTROS ABS: 2.5 10*3/uL (ref 1.4–7.0)
Neutrophils: 52 %
Platelets: 225 10*3/uL (ref 150–450)
RBC: 3.68 x10E6/uL — AB (ref 4.14–5.80)
RDW: 17.5 % — AB (ref 12.3–15.4)
WBC: 4.9 10*3/uL (ref 3.4–10.8)

## 2018-05-09 LAB — TSH: TSH: 0.869 u[IU]/mL (ref 0.450–4.500)

## 2018-05-17 ENCOUNTER — Telehealth: Payer: Self-pay

## 2018-05-17 NOTE — Telephone Encounter (Signed)
Social Work Theatre manager called and spoke with patient. He said that he is still having problems with transportation. His food and housing situation has stabilized.

## 2018-05-20 ENCOUNTER — Telehealth: Payer: Self-pay | Admitting: Licensed Clinical Social Worker

## 2018-05-20 NOTE — Telephone Encounter (Signed)
LCSW called [facility] regarding alcohol detox options. LCSW shared about [facility] program and what it would be like. LCSW and Eshaan discussed his concerns regarding who would care for his pets. Seth [name] shared that he is feeling very ready for substance abuse treatment and recognizes that he can't do it on his own. He scheduled to come in tomorrow at 10 to make the screening call with [facility], with LCSW assistance.

## 2018-05-21 ENCOUNTER — Ambulatory Visit: Payer: Self-pay | Admitting: Licensed Clinical Social Worker

## 2018-05-21 DIAGNOSIS — F1020 Alcohol dependence, uncomplicated: Secondary | ICD-10-CM

## 2018-05-23 NOTE — Progress Notes (Signed)
   THERAPY PROGRESS NOTE  Session Time: 66min  Participation Level: Active  Behavioral Response: CasualAlertEuthymic  Type of Therapy: Individual Therapy  Treatment Goals addressed: Coping  Interventions: Motivational Interviewing and Supportive  Summary: Seth [name] is a 62 y.o. male who presents with a euthymic mood and appropriate affect. He reported that he was not ready to call the detox program today because he wanted to try one more time to stop drinking on his own. He stated that he believes he has the strength to quit on his own, if only he can "get through 7 days." He reviewed some withdrawal symptoms and risks with LCSW. He agreed that he will call 911 if he starts to feel very poorly or have severe withdrawal symptoms. He shared that he is feeling highly motivated to quit because he has continued to lose weight, which is probably due to drinking. He expressed concerns that the drinking is killing him slowly. Seth [name] shared that if he cannot stop on his own this week, he will go into the detox program next week. He stated that he needs to find someone to take care of his 6 dogs before he feels comfortable starting the program. He brainstormed with LCSW about who might be able to help him.   Suicidal/Homicidal: Nowithout intent/plan  Therapist Response: LCSW utilized supportive counseling techniques throughout the session in order to validate emotions and encourage open expression of emotion. LCSW used Motivational Interviewing techniques to assess and increase motivation to quit drinking, as well as amplify ambivalence. LCSW provided information about [facility] treatment program. LCSW cautioned Seth [name] about potential withdrawal symptoms and normalized needing a treatment program to quit drinking.  Plan: Return again in 1 weeks.    Metta Clines, LCSW 05/23/2018

## 2018-05-28 ENCOUNTER — Ambulatory Visit: Payer: Self-pay | Admitting: Licensed Clinical Social Worker

## 2018-05-28 DIAGNOSIS — F1020 Alcohol dependence, uncomplicated: Secondary | ICD-10-CM

## 2018-05-31 ENCOUNTER — Telehealth: Payer: Self-pay | Admitting: Licensed Clinical Social Worker

## 2018-05-31 NOTE — Progress Notes (Signed)
   THERAPY PROGRESS NOTE  Session Time: 51min  Participation Level: Active  Behavioral Response: CasualAlertDepressed  Type of Therapy: Individual Therapy  Treatment Goals addressed: Coping  Interventions: Motivational Interviewing and Supportive  Summary: Seth [name] is a 62 y.o. male who presents with a depressed mood and appropriate affect. He reported that he is feeling upset with himself because he was not able to quit drinking last week. He tearfully expressed that he thought he "had more willpower than that." He shared that he is very interested in going to detox and treatment but that he is not ready to do the phone assessment today. He reported that a friend has picked up 4 of his dogs but he needs to make a plan for the other 2 dogs so that he can go to treatment. He shared that he is highly motivated to get help because he recognizes that his drinking is seriously harming his health.  Suicidal/Homicidal: Nowithout intent/plan  Therapist Response: LCSW utilized supportive counseling techniques throughout the session in order to validate emotions and encourage open expression of emotion. LCSW normalized the difficulty of quitting alcohol on his own. LCSW provided affirmations to Florence for his efforts and motivational level. LCSW encouraged him to call [facility] for his phone assessment as soon as possible.  Plan: LCSW to call [facility] on 05/31/18 to check in.     Metta Clines, LCSW 05/31/2018

## 2018-05-31 NOTE — Telephone Encounter (Signed)
LCSW called [facility] to follow up regarding possibly alcohol treatment. Left voicemail.

## 2018-06-03 ENCOUNTER — Telehealth: Payer: Self-pay | Admitting: Licensed Clinical Social Worker

## 2018-06-03 NOTE — Telephone Encounter (Signed)
LCSW called [name] to check in re: progress towards detox and treatment. [name] reported that he called [facility] last week but there was not an available bed. He stated that he was waiting for his check to arrive today or tomorrow and then he will call again to try to get a bed.

## 2018-06-07 ENCOUNTER — Ambulatory Visit: Payer: Self-pay | Admitting: Internal Medicine

## 2018-06-17 ENCOUNTER — Other Ambulatory Visit: Payer: Self-pay | Admitting: Licensed Clinical Social Worker

## 2018-06-18 ENCOUNTER — Other Ambulatory Visit: Payer: Self-pay | Admitting: Licensed Clinical Social Worker

## 2018-06-20 ENCOUNTER — Telehealth: Payer: Self-pay | Admitting: Licensed Clinical Social Worker

## 2018-06-20 NOTE — Telephone Encounter (Signed)
LCSW left Seth [name] a voicemail after he no-showed his appt. 10/22. LCSW stated on the VM that a social work intern will call him to follow-up, as LCSW is leaving the clinic next week.

## 2018-06-26 ENCOUNTER — Telehealth: Payer: Self-pay | Admitting: Licensed Clinical Social Worker

## 2018-06-26 NOTE — Telephone Encounter (Signed)
Social Work Administrator called and spoke to Mr. Seth [name] about scheduling a counseling session, he said he would call at a later date to schedule.

## 2018-09-22 ENCOUNTER — Encounter: Payer: Self-pay | Admitting: Internal Medicine

## 2019-02-19 ENCOUNTER — Emergency Department (HOSPITAL_COMMUNITY): Payer: Medicaid Other

## 2019-02-19 ENCOUNTER — Other Ambulatory Visit: Payer: Self-pay

## 2019-02-19 ENCOUNTER — Inpatient Hospital Stay (HOSPITAL_COMMUNITY)
Admission: EM | Admit: 2019-02-19 | Discharge: 2019-02-24 | DRG: 074 | Disposition: A | Discharge status: Home Health | Payer: Medicaid Other | Attending: Internal Medicine | Admitting: Internal Medicine

## 2019-02-19 ENCOUNTER — Encounter (HOSPITAL_COMMUNITY): Payer: Self-pay

## 2019-02-19 DIAGNOSIS — F10288 Alcohol dependence with other alcohol-induced disorder: Secondary | ICD-10-CM | POA: Diagnosis present

## 2019-02-19 DIAGNOSIS — Z8673 Personal history of transient ischemic attack (TIA), and cerebral infarction without residual deficits: Secondary | ICD-10-CM | POA: Diagnosis not present

## 2019-02-19 DIAGNOSIS — R079 Chest pain, unspecified: Secondary | ICD-10-CM | POA: Diagnosis present

## 2019-02-19 DIAGNOSIS — F1090 Alcohol use, unspecified, uncomplicated: Secondary | ICD-10-CM

## 2019-02-19 DIAGNOSIS — W19XXXA Unspecified fall, initial encounter: Secondary | ICD-10-CM

## 2019-02-19 DIAGNOSIS — Z9114 Patient's other noncompliance with medication regimen: Secondary | ICD-10-CM | POA: Diagnosis not present

## 2019-02-19 DIAGNOSIS — R55 Syncope and collapse: Secondary | ICD-10-CM

## 2019-02-19 DIAGNOSIS — F1010 Alcohol abuse, uncomplicated: Secondary | ICD-10-CM

## 2019-02-19 DIAGNOSIS — J441 Chronic obstructive pulmonary disease with (acute) exacerbation: Secondary | ICD-10-CM | POA: Diagnosis present

## 2019-02-19 DIAGNOSIS — D539 Nutritional anemia, unspecified: Secondary | ICD-10-CM | POA: Diagnosis present

## 2019-02-19 DIAGNOSIS — F17210 Nicotine dependence, cigarettes, uncomplicated: Secondary | ICD-10-CM | POA: Diagnosis present

## 2019-02-19 DIAGNOSIS — Z8249 Family history of ischemic heart disease and other diseases of the circulatory system: Secondary | ICD-10-CM | POA: Diagnosis not present

## 2019-02-19 DIAGNOSIS — W010XXA Fall on same level from slipping, tripping and stumbling without subsequent striking against object, initial encounter: Secondary | ICD-10-CM | POA: Diagnosis present

## 2019-02-19 DIAGNOSIS — Z20828 Contact with and (suspected) exposure to other viral communicable diseases: Secondary | ICD-10-CM | POA: Diagnosis present

## 2019-02-19 DIAGNOSIS — I160 Hypertensive urgency: Secondary | ICD-10-CM | POA: Diagnosis present

## 2019-02-19 DIAGNOSIS — R296 Repeated falls: Secondary | ICD-10-CM | POA: Diagnosis present

## 2019-02-19 DIAGNOSIS — E875 Hyperkalemia: Secondary | ICD-10-CM | POA: Diagnosis present

## 2019-02-19 DIAGNOSIS — S82402A Unspecified fracture of shaft of left fibula, initial encounter for closed fracture: Secondary | ICD-10-CM | POA: Diagnosis present

## 2019-02-19 DIAGNOSIS — R29898 Other symptoms and signs involving the musculoskeletal system: Secondary | ICD-10-CM

## 2019-02-19 DIAGNOSIS — J449 Chronic obstructive pulmonary disease, unspecified: Secondary | ICD-10-CM | POA: Diagnosis present

## 2019-02-19 DIAGNOSIS — G621 Alcoholic polyneuropathy: Secondary | ICD-10-CM | POA: Diagnosis present

## 2019-02-19 DIAGNOSIS — I10 Essential (primary) hypertension: Secondary | ICD-10-CM | POA: Diagnosis present

## 2019-02-19 DIAGNOSIS — S82832A Other fracture of upper and lower end of left fibula, initial encounter for closed fracture: Secondary | ICD-10-CM | POA: Diagnosis present

## 2019-02-19 DIAGNOSIS — F1020 Alcohol dependence, uncomplicated: Secondary | ICD-10-CM

## 2019-02-19 DIAGNOSIS — R000 Tachycardia, unspecified: Secondary | ICD-10-CM | POA: Diagnosis present

## 2019-02-19 DIAGNOSIS — R531 Weakness: Secondary | ICD-10-CM

## 2019-02-19 HISTORY — DX: Benign prostatic hyperplasia without lower urinary tract symptoms: N40.0

## 2019-02-19 HISTORY — DX: Cerebral infarction, unspecified: I63.9

## 2019-02-19 LAB — TROPONIN I (HIGH SENSITIVITY)
Troponin I (High Sensitivity): 6 ng/L (ref <18)
Troponin I (High Sensitivity): 6.2 ng/L (ref <18)

## 2019-02-19 LAB — CBC WITH DIFFERENTIAL/PLATELET
Abs Immature Granulocytes: 0.05 10*3/uL (ref 0.00–0.07)
Basophils Absolute: 0 10*3/uL (ref 0.0–0.1)
Basophils Relative: 0 %
Eosinophils Absolute: 0 10*3/uL (ref 0.0–0.5)
Eosinophils Relative: 1 %
HCT: 30.8 % — ABNORMAL LOW (ref 39.0–52.0)
Hemoglobin: 10.2 g/dL — ABNORMAL LOW (ref 13.0–17.0)
Immature Granulocytes: 1 %
Lymphocytes Relative: 16 %
Lymphs Abs: 1.2 10*3/uL (ref 0.7–4.0)
MCH: 34.9 pg — ABNORMAL HIGH (ref 26.0–34.0)
MCHC: 33.1 g/dL (ref 30.0–36.0)
MCV: 105.5 fL — ABNORMAL HIGH (ref 80.0–100.0)
Monocytes Absolute: 0.9 10*3/uL (ref 0.1–1.0)
Monocytes Relative: 13 %
Neutro Abs: 5.1 10*3/uL (ref 1.7–7.7)
Neutrophils Relative %: 69 %
Platelets: 166 10*3/uL (ref 150–400)
RBC: 2.92 MIL/uL — ABNORMAL LOW (ref 4.22–5.81)
RDW: 17.6 % — ABNORMAL HIGH (ref 11.5–15.5)
WBC: 7.3 10*3/uL (ref 4.0–10.5)
nRBC: 0.5 % — ABNORMAL HIGH (ref 0.0–0.2)

## 2019-02-19 LAB — COMPREHENSIVE METABOLIC PANEL
ALT: 21 U/L (ref 0–44)
AST: 72 U/L — ABNORMAL HIGH (ref 15–41)
Albumin: 4.1 g/dL (ref 3.5–5.0)
Alkaline Phosphatase: 106 U/L (ref 38–126)
Anion gap: 21 — ABNORMAL HIGH (ref 5–15)
BUN: 12 mg/dL (ref 8–23)
CO2: 22 mmol/L (ref 22–32)
Calcium: 9.3 mg/dL (ref 8.9–10.3)
Chloride: 94 mmol/L — ABNORMAL LOW (ref 98–111)
Creatinine, Ser: 1.12 mg/dL (ref 0.61–1.24)
GFR calc Af Amer: >60 mL/min (ref >60)
GFR calc non Af Amer: >60 mL/min (ref >60)
Glucose, Bld: 97 mg/dL (ref 70–99)
Potassium: 5.2 mmol/L — ABNORMAL HIGH (ref 3.5–5.1)
Sodium: 137 mmol/L (ref 135–145)
Total Bilirubin: 2.1 mg/dL — ABNORMAL HIGH (ref 0.3–1.2)
Total Protein: 7.4 g/dL (ref 6.5–8.1)

## 2019-02-19 LAB — BLOOD GAS, VENOUS
Acid-Base Excess: 3.5 mmol/L — ABNORMAL HIGH (ref 0.0–2.0)
Bicarbonate: 27.8 mmol/L (ref 20.0–28.0)
FIO2: 21
O2 Saturation: 31.5 %
Patient temperature: 98.6
pCO2, Ven: 43.3 mmHg — ABNORMAL LOW (ref 44.0–60.0)
pH, Ven: 7.423 (ref 7.250–7.430)

## 2019-02-19 LAB — SARS CORONAVIRUS 2 BY RT PCR (HOSPITAL ORDER, PERFORMED IN ~~LOC~~ HOSPITAL LAB): SARS Coronavirus 2: NEGATIVE

## 2019-02-19 LAB — MAGNESIUM: Magnesium: 1.6 mg/dL — ABNORMAL LOW (ref 1.7–2.4)

## 2019-02-19 LAB — D-DIMER, QUANTITATIVE: D-Dimer, Quant: 1.24 ug/mL-FEU — ABNORMAL HIGH (ref 0.00–0.50)

## 2019-02-19 LAB — BRAIN NATRIURETIC PEPTIDE: B Natriuretic Peptide: 33.8 pg/mL (ref 0.0–100.0)

## 2019-02-19 LAB — CBC
HCT: 30 % — ABNORMAL LOW (ref 39.0–52.0)
Hemoglobin: 9.8 g/dL — ABNORMAL LOW (ref 13.0–17.0)
MCH: 34.5 pg — ABNORMAL HIGH (ref 26.0–34.0)
MCHC: 32.7 g/dL (ref 30.0–36.0)
MCV: 105.6 fL — ABNORMAL HIGH (ref 80.0–100.0)
Platelets: 152 10*3/uL (ref 150–400)
RBC: 2.84 MIL/uL — ABNORMAL LOW (ref 4.22–5.81)
RDW: 17.3 % — ABNORMAL HIGH (ref 11.5–15.5)
WBC: 5.7 10*3/uL (ref 4.0–10.5)
nRBC: 0.9 % — ABNORMAL HIGH (ref 0.0–0.2)

## 2019-02-19 LAB — CREATININE, SERUM
Creatinine, Ser: 0.9 mg/dL (ref 0.61–1.24)
GFR calc Af Amer: >60 mL/min (ref >60)
GFR calc non Af Amer: >60 mL/min (ref >60)

## 2019-02-19 LAB — TSH: TSH: 2.197 u[IU]/mL (ref 0.350–4.500)

## 2019-02-19 LAB — LACTIC ACID, PLASMA: Lactic Acid, Venous: 1.4 mmol/L (ref 0.5–1.9)

## 2019-02-19 LAB — VITAMIN B12: Vitamin B-12: 389 pg/mL (ref 180–914)

## 2019-02-19 LAB — ETHANOL: Alcohol, Ethyl (B): <10 mg/dL (ref <10)

## 2019-02-19 IMAGING — CR LEFT KNEE - COMPLETE 4+ VIEW
4 series · 4 of 4 positions shown · non-contrast
Comparison: None.

CLINICAL DATA: Left knee pain after fall yesterday.

EXAM:
LEFT KNEE - COMPLETE 4+ VIEW

[t knee ap left]
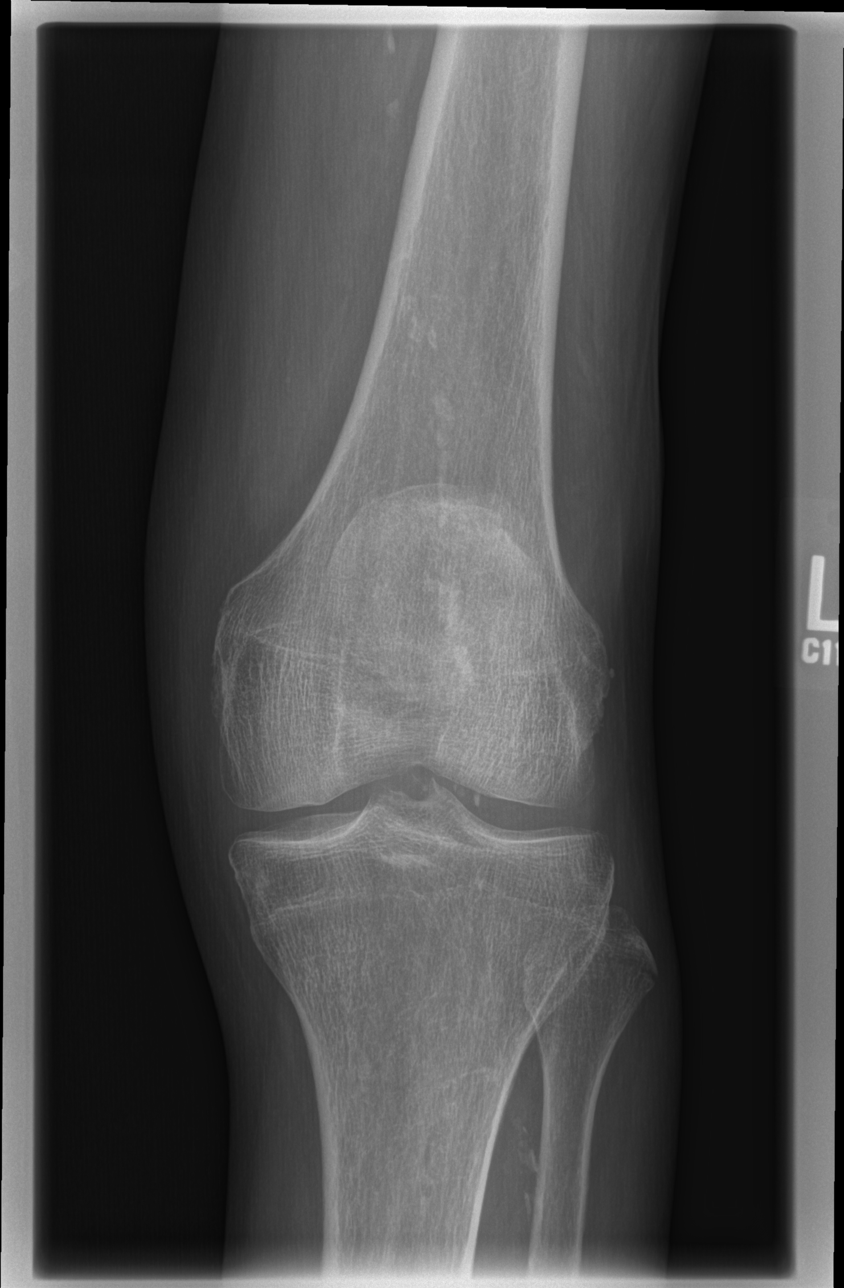

[t knee obl left (1 of 2)]
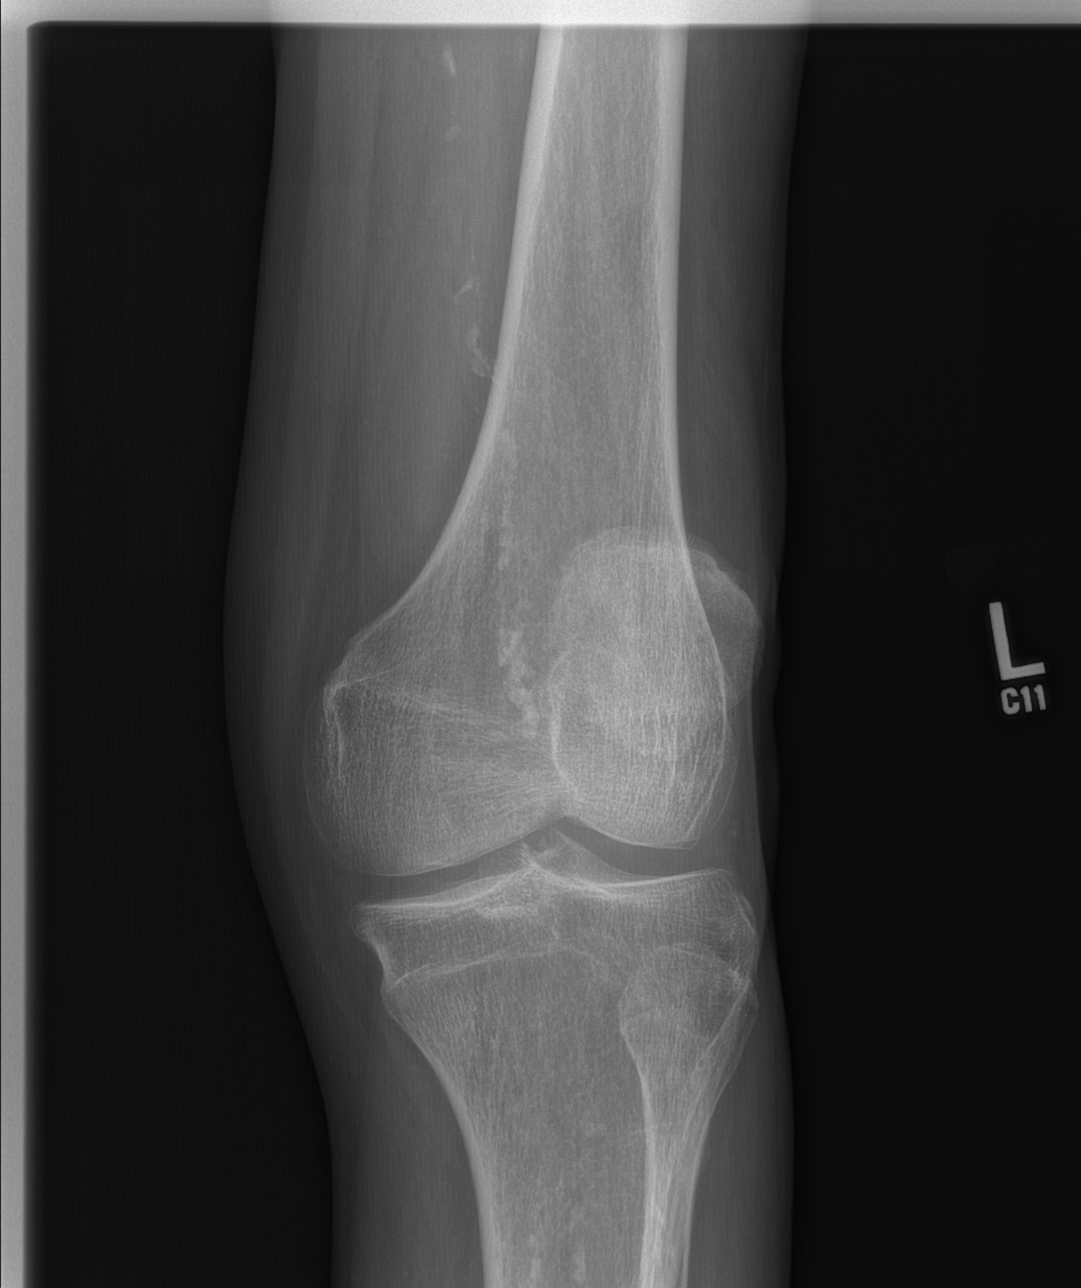

[t knee obl left (2 of 2)]
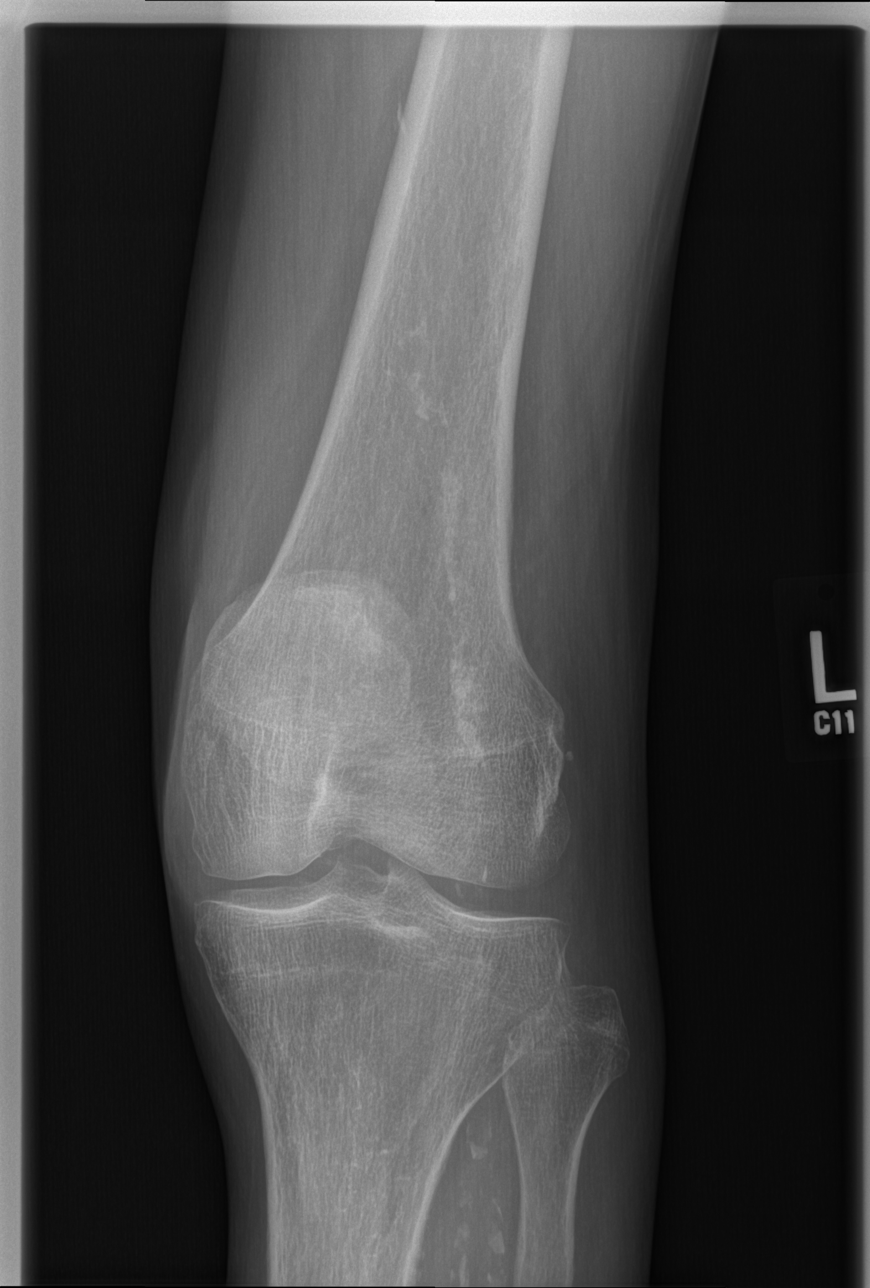

[t knee lat left]
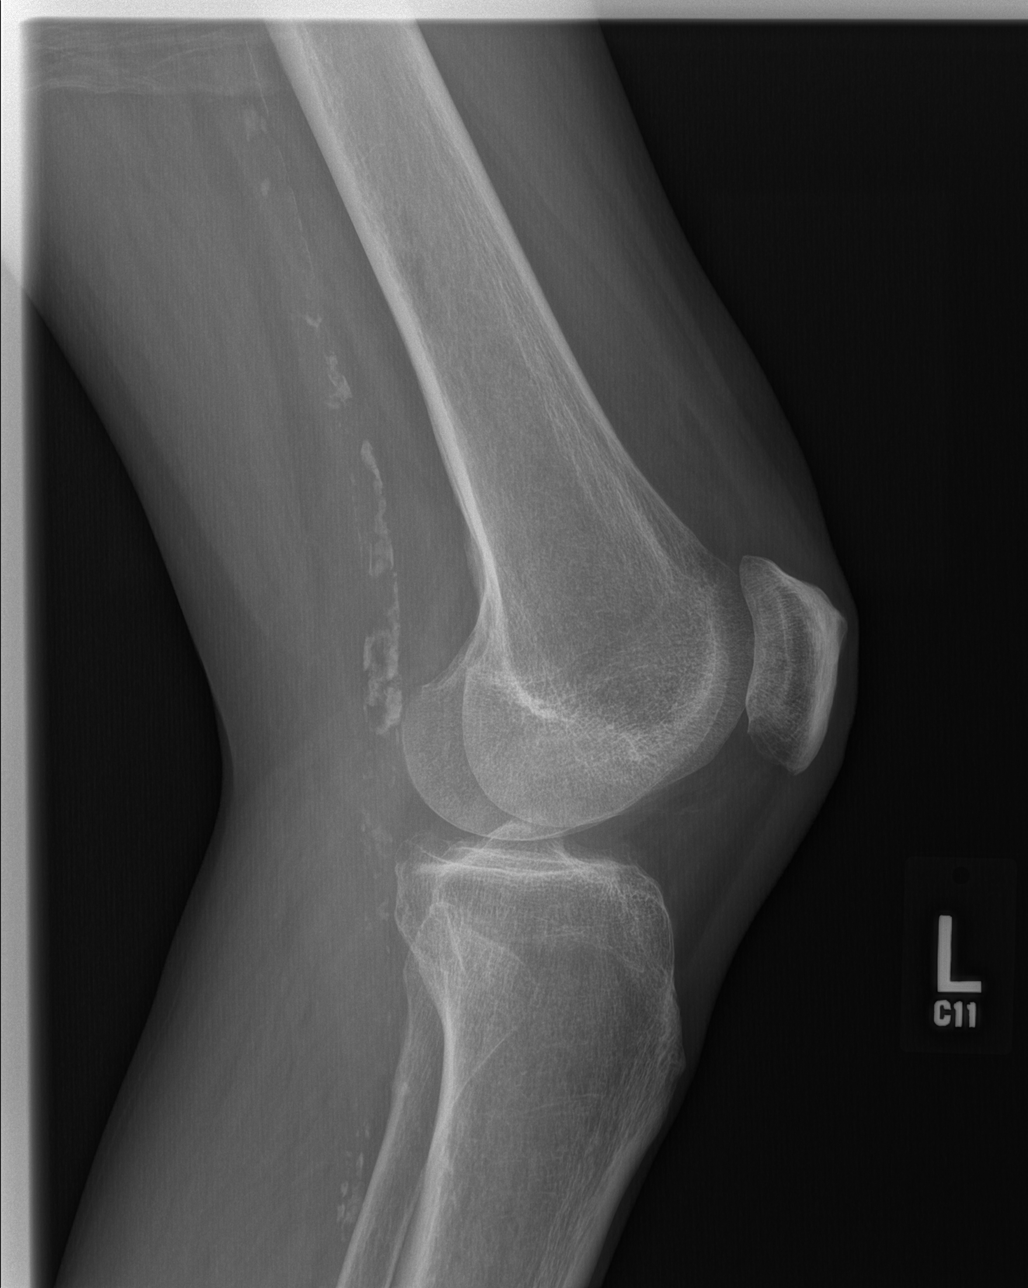

[4 of 4 positions shown; findings below may reference images not displayed]

FINDINGS: Probable nondisplaced fracture is seen involving the proximal left
fibula. No other fracture or dislocation is noted. No evidence of
arthropathy or other focal bone abnormality. Vascular calcifications
are noted.
IMPRESSION: Probable nondisplaced fracture involving the proximal left fibula.

## 2019-02-19 IMAGING — CT CT ANGIOGRAPHY CHEST
2 of 7 series · 18 of 46 positions shown · IV contrast (omnipaque)
Comparison: Chest CT [DATE] and chest CT [DATE]; chest radiograph [DATE]

CLINICAL DATA: Shortness of breath

EXAM:
CT ANGIOGRAPHY CHEST WITH CONTRAST
TECHNIQUE: Multidetector CT imaging of the chest was performed using the
standard protocol during bolus administration of intravenous
contrast. Multiplanar CT image reconstructions and MIPs were
obtained to evaluate the vascular anatomy.
CONTRAST:  69mL OMNIPAQUE IOHEXOL 350 MG/ML SOLN

[Series 5: thins · axial · 0.83mm/px · z∈[-290,+8]mm · 15 of 337 slices shown]
[im 20/337  lung]
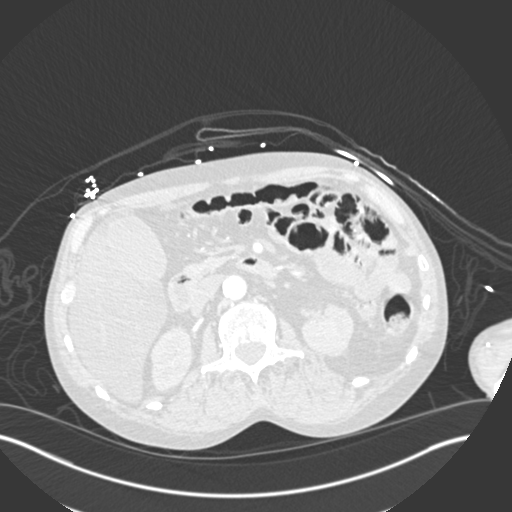
[im 40/337  soft-tissue]
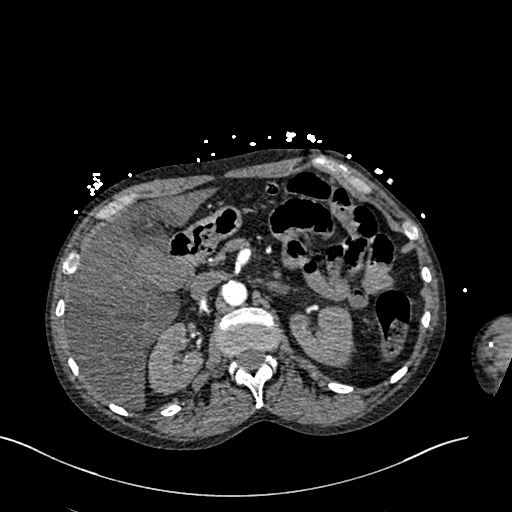
[im 60/337  lung]
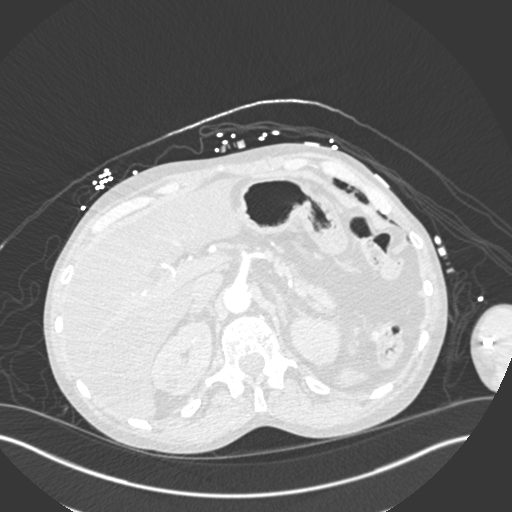
[im 80/337  soft-tissue]
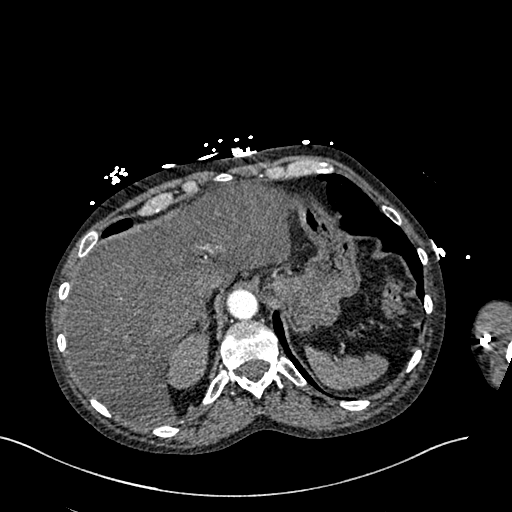
[im 99/337  lung]
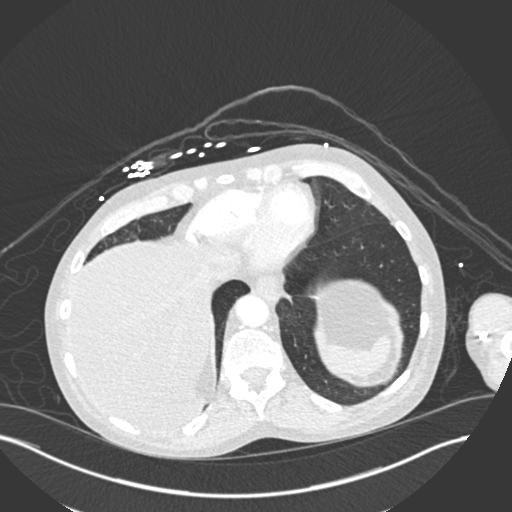
[im 119/337  soft-tissue]
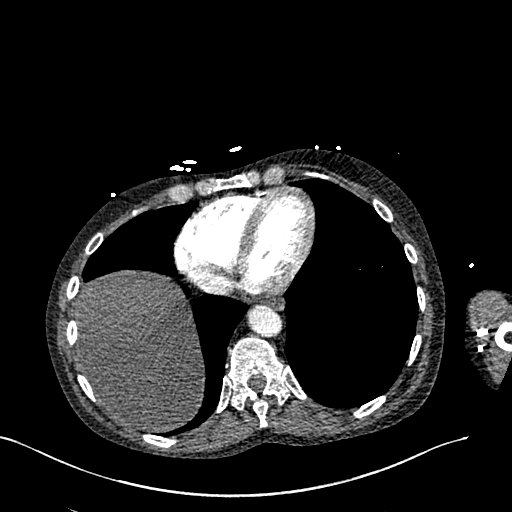
[im 139/337  lung]
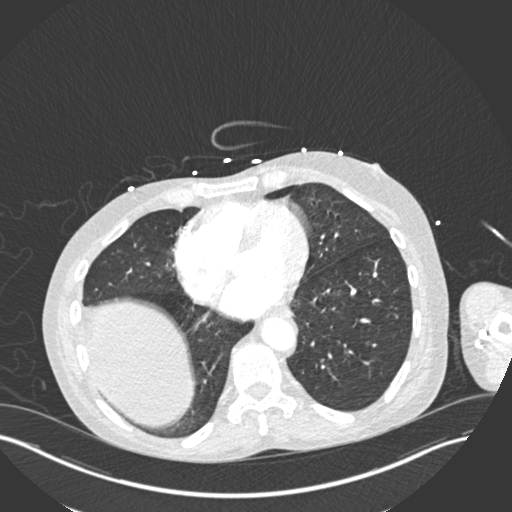
[im 178/337  soft-tissue]
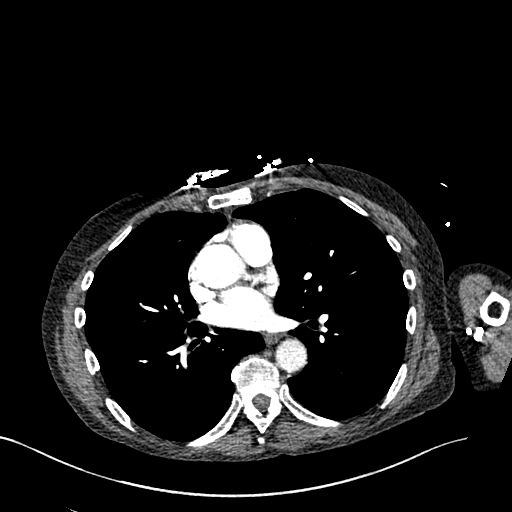
[im 198/337  lung]
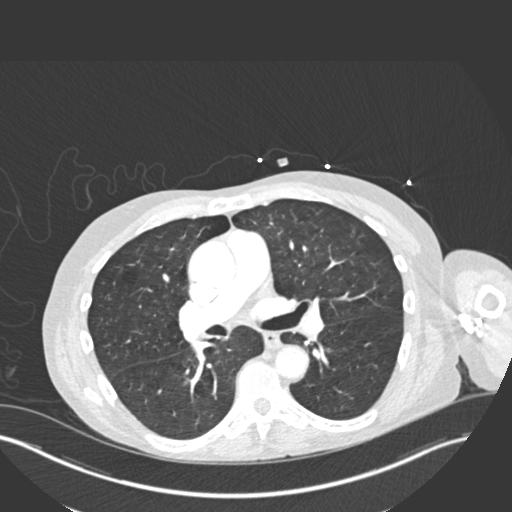
[im 218/337  soft-tissue]
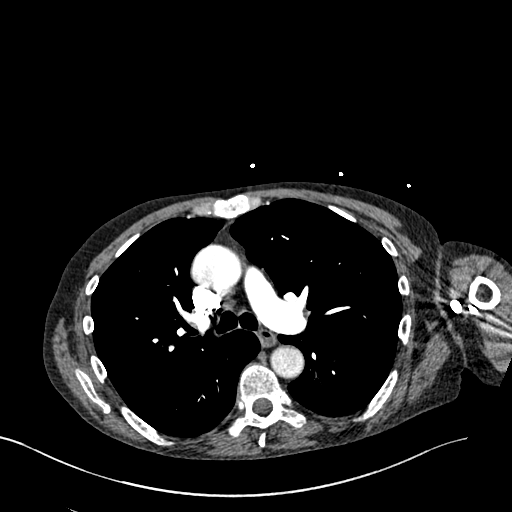
[im 238/337  lung]
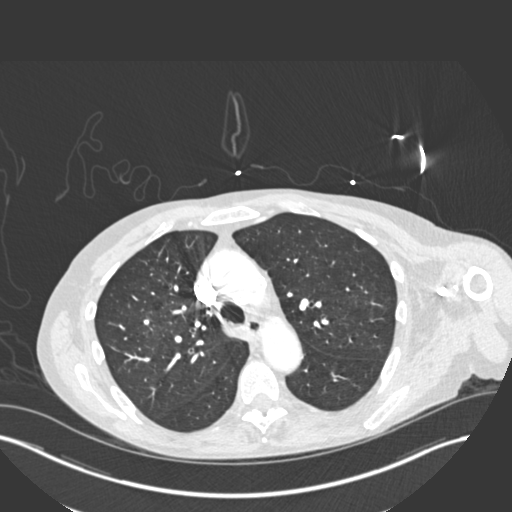
[im 257/337  soft-tissue]
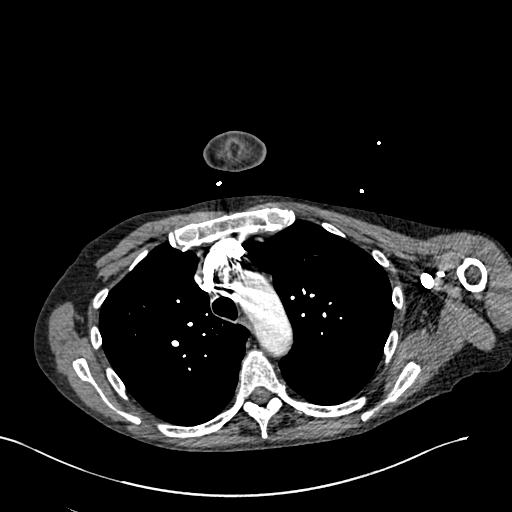
[im 277/337  lung]
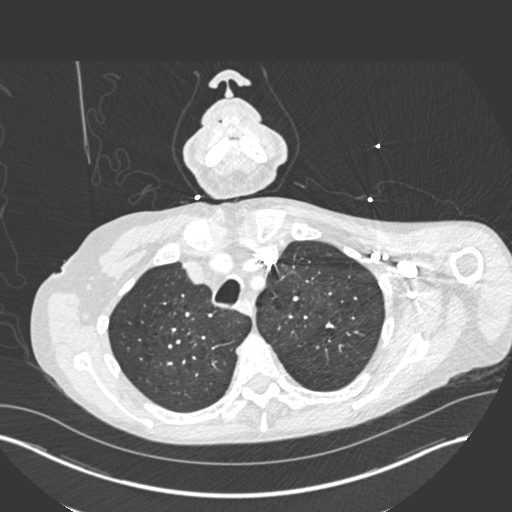
[im 297/337  soft-tissue]
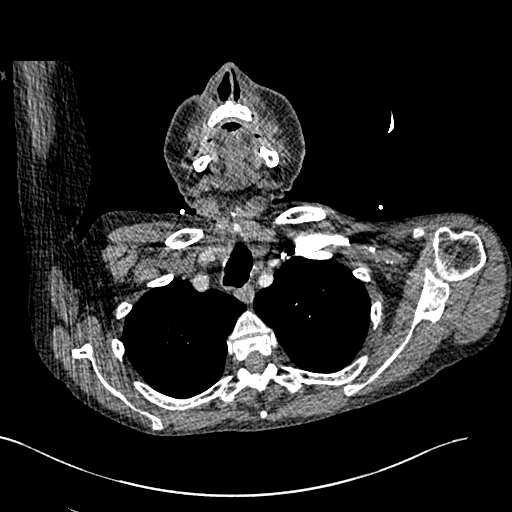
[im 317/337  lung]
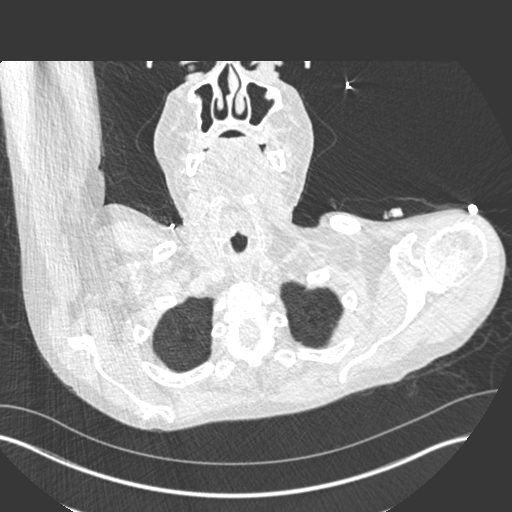

[Series 6: coronal mpr · coronal · 0.75mm/px · 3 of 124 slices shown]
[im 31/124  soft-tissue]
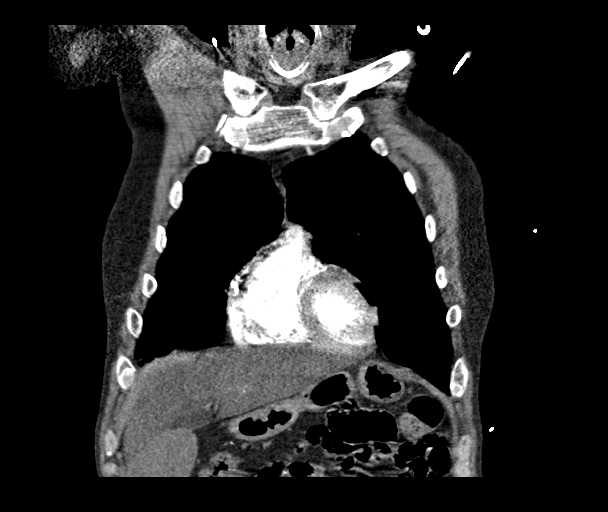
[im 62/124  soft-tissue]
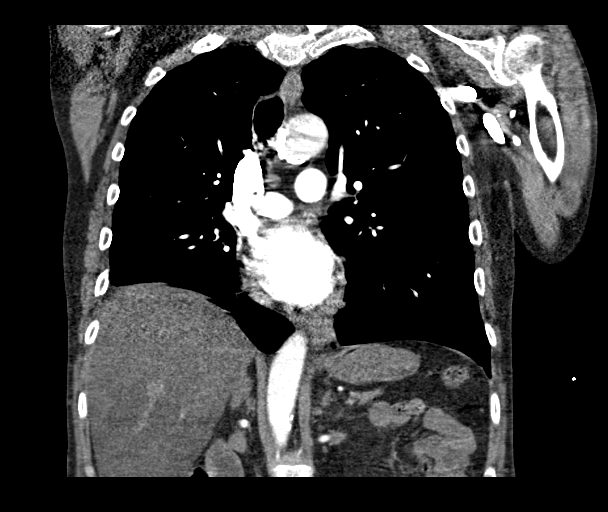
[im 93/124  soft-tissue]
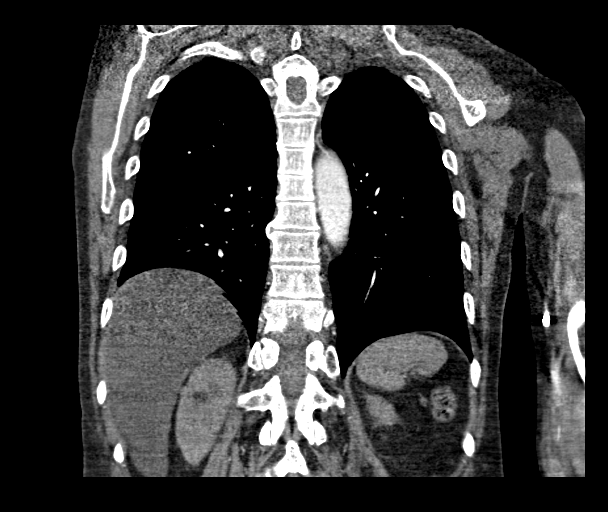

[18 of 46 positions shown; findings below may reference images not displayed]

FINDINGS: Cardiovascular: There is no demonstrable pulmonary embolus.
Ascending thoracic aorta measures 4.0 x 4.0 cm in maximal transverse
diameter. No dissection evident. Visualized great vessels appear
unremarkable. There are foci of aortic atherosclerosis as well as
foci of coronary artery calcification. There is no pericardial
effusion or pericardial thickening evident.

Mediastinum/Nodes: Thyroid appears unremarkable. There is no
appreciable thoracic adenopathy. No esophageal lesions are evident.

Lungs/Pleura: There is a stable 6 x 6 mm nodular opacity abutting
the pleura in the posterior segment of the right upper lobe. There
is mild centrilobular emphysematous change. There is no evident
edema or consolidation.

Upper Abdomen: There is hepatic steatosis. Visualized upper
abdominal structures otherwise appear unremarkable except for aortic
atherosclerosis.

Musculoskeletal: Old healed fracture of the sternum again noted.
There are no blastic or lytic bone lesions. No chest wall lesions
are evident.

Review of the MIP images confirms the above findings.
IMPRESSION: 1.  No demonstrable pulmonary embolus.

2. Ascending thoracic aortic diameter measures 4.0 x 4.0 cm,
essentially stable. No dissection evident. There is aortic
atherosclerosis as well as foci of coronary artery calcification.
Recommend annual imaging followup by CTA or MRA. This recommendation
follows [VV] ACCF/AHA/AATS/ACR/ASA/SCA/WAHBA/WAHBA/WAHBA/WAHBA Guidelines
for the Diagnosis and Management of Patients with Thoracic Aortic
Disease. Circulation. [VV]; 121: E266-e369. Aortic aneurysm NOS
([VV]-[VV]).

3. Stable 6 mm nodular opacity right upper lobe. Stability over time
since [VV] is indicative of benign etiology. Mild underlying
emphysematous change. No edema or consolidation.

4.  No evident adenopathy.

5.  Hepatic steatosis.

Aortic Atherosclerosis ([VV]-[VV]) and Emphysema ([VV]-[VV]).

## 2019-02-19 IMAGING — CT CT HEAD WITHOUT CONTRAST
3 series · 14 of 47 positions shown, 16 images · non-contrast
Comparison: [DATE]

CLINICAL DATA: Recurrent syncope. Fell 2 weeks ago and again
yesterday. Bilateral lower extremity weakness.

EXAM:
CT HEAD WITHOUT CONTRAST
TECHNIQUE: Contiguous axial images were obtained from the base of the skull
through the vertex without intravenous contrast.

[Series 2: head wo · axial · 0.47mm/px · z∈[-123,+2]mm · 8 of 30 slices shown, 10 images]
[im 3/30  brain]
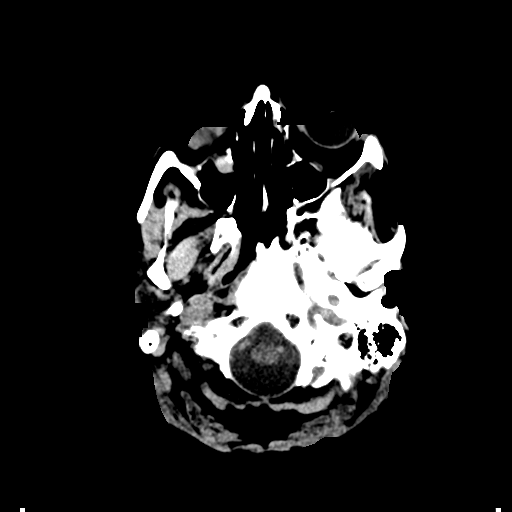
[im 3/30  bone]
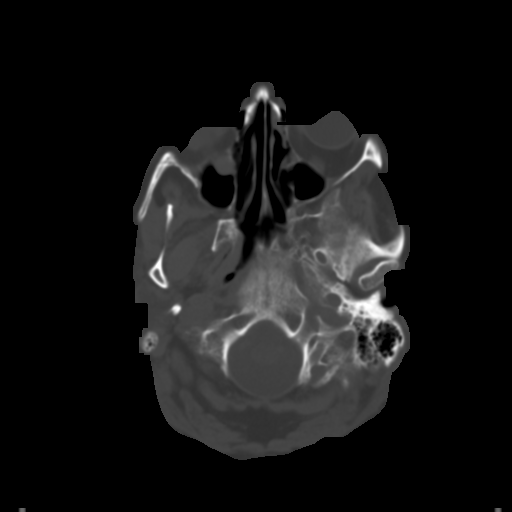
[im 7/30  brain]
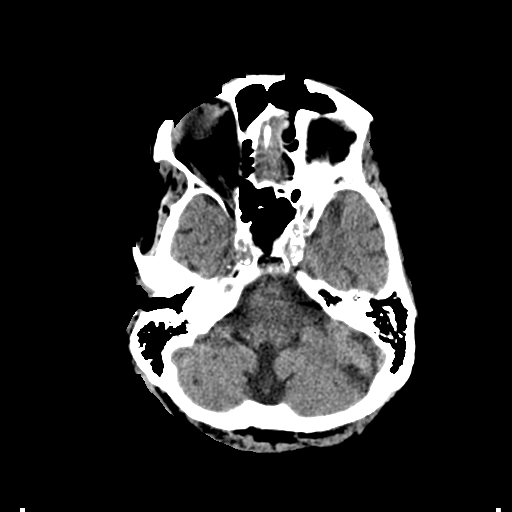
[im 10/30  brain]
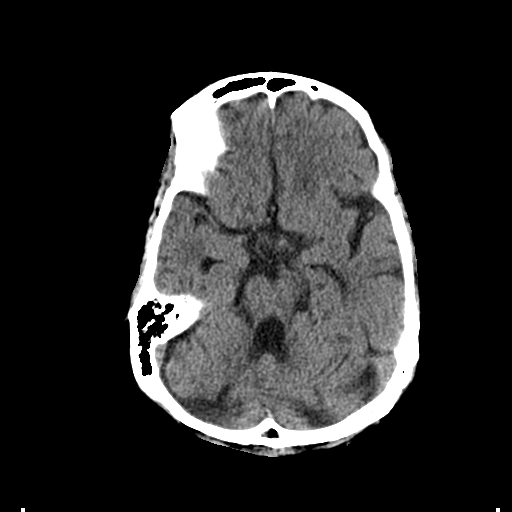
[im 14/30  brain]
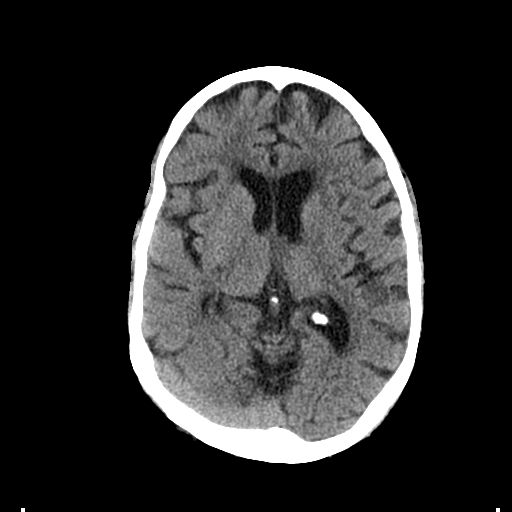
[im 17/30  brain]
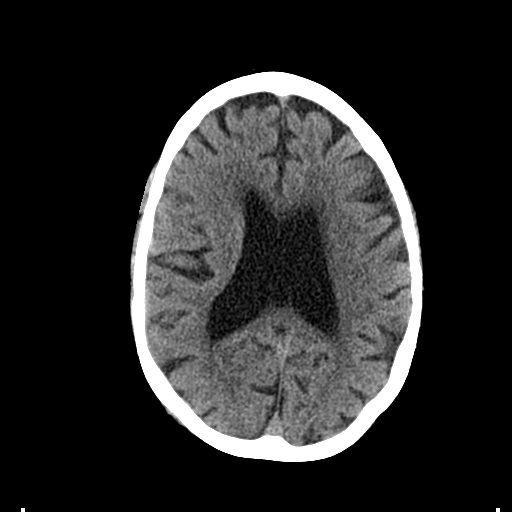
[im 17/30  bone]
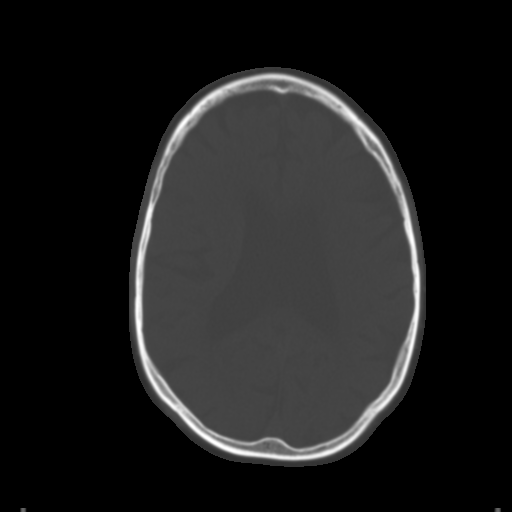
[im 21/30  brain]
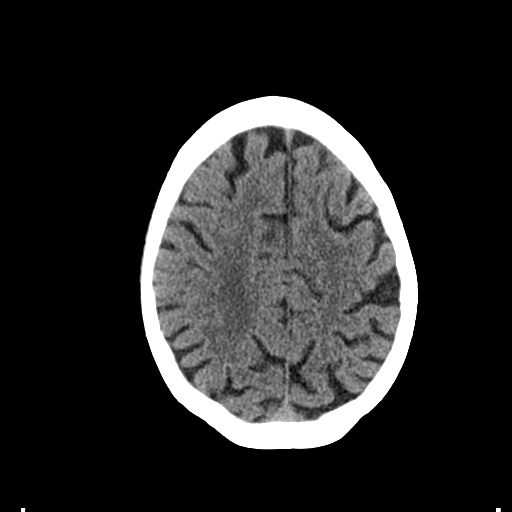
[im 24/30  brain]
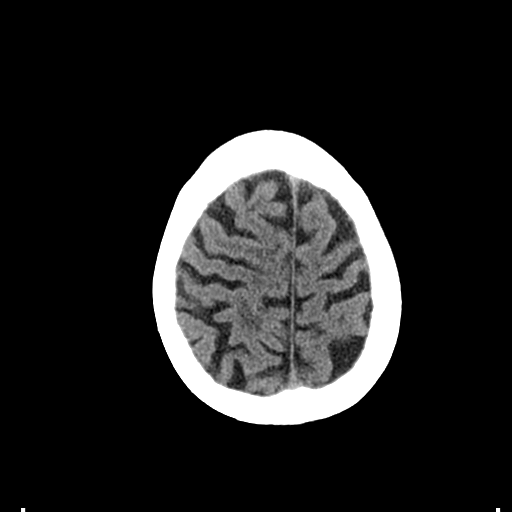
[im 28/30  brain]
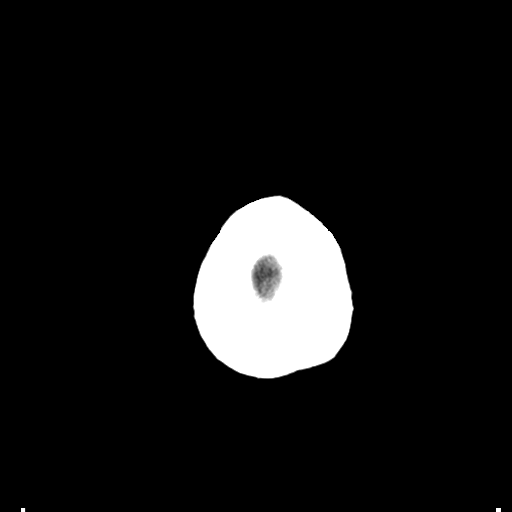

[Series 5: coronal soft tissue · coronal · 0.31mm/px · 3 of 72 slices shown]
[im 24/72  brain]
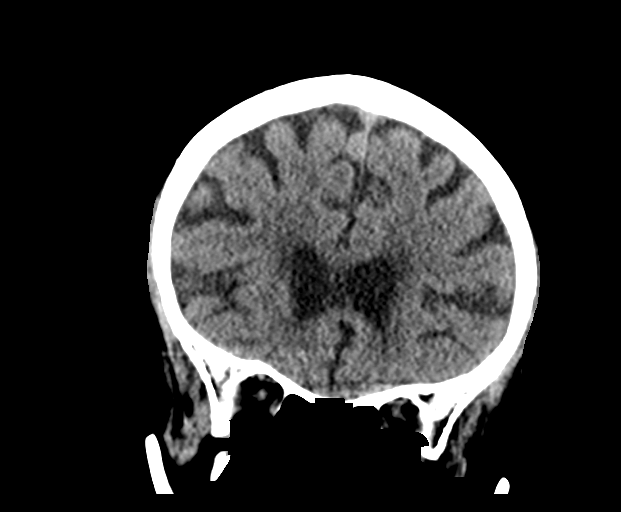
[im 32/72  brain]
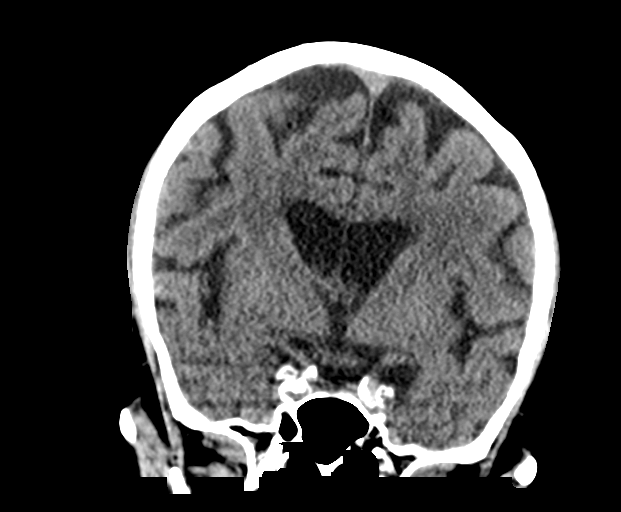
[im 40/72  brain]
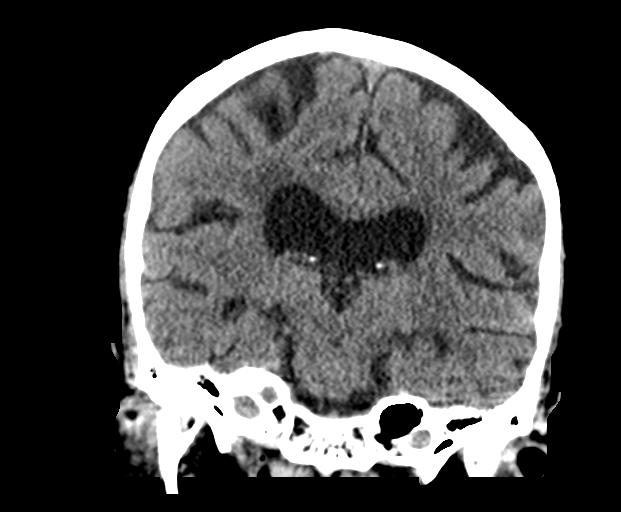

[Series 6: sagittal soft tissue · sagittal · 0.30mm/px · 3 of 56 slices shown]
[im 19/56  brain]
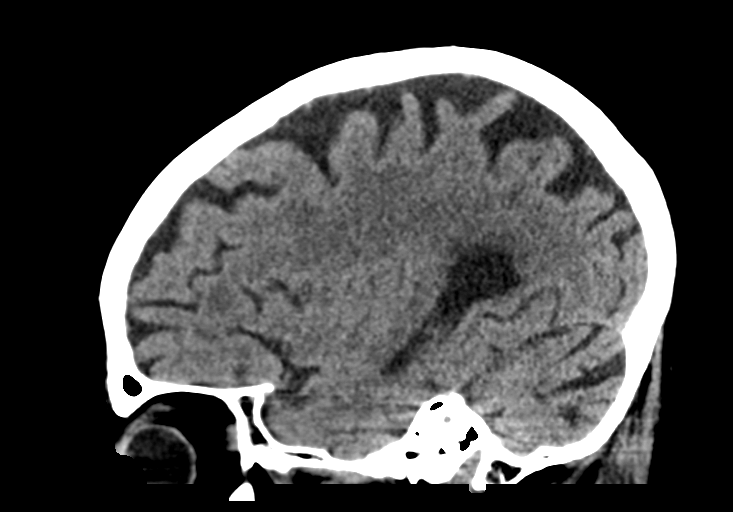
[im 28/56  brain]
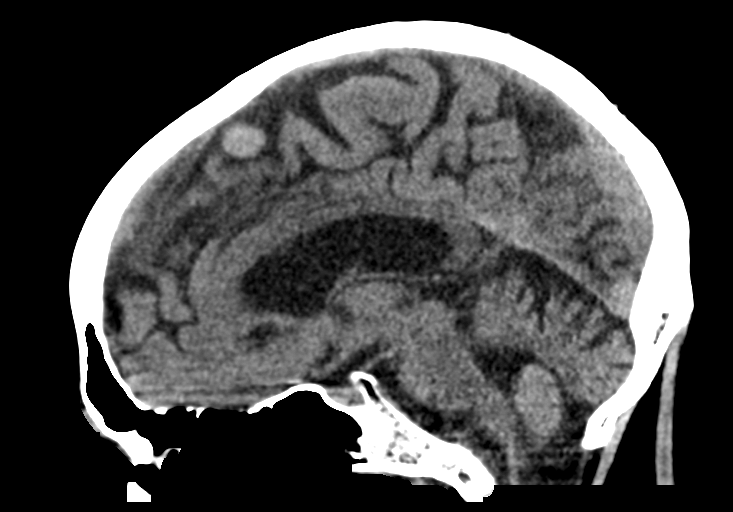
[im 37/56  brain]
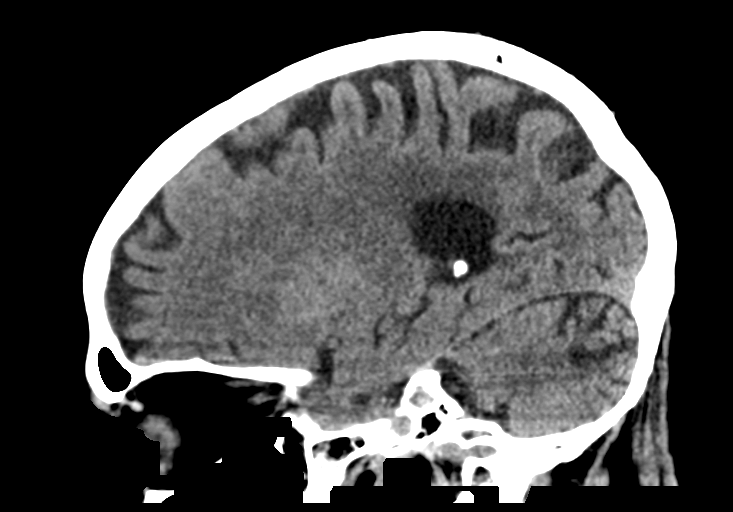

[14 of 47 positions shown; findings below may reference images not displayed]

FINDINGS: Brain: There is no evidence of acute infarct, intracranial
hemorrhage, mass, midline shift, or extra-axial fluid collection.
Mildly age advanced cerebral atrophy has progressed from the prior
study. There is also advanced cerebellar atrophy with multiple small
chronic bilateral cerebellar infarcts again noted. Bilateral
cerebral white matter hypodensities have also progressed and are
nonspecific but compatible with mild chronic small vessel ischemic
disease.

Vascular: Calcified atherosclerosis at the skull base. No hyperdense
vessel.

Skull: No fracture or focal osseous lesion.

Sinuses/Orbits: Small right maxillary sinus mucous retention cyst.
Clear mastoid air cells. Unremarkable orbits.

Other: None.
IMPRESSION: 1. No evidence of acute intracranial abnormality.
2. Progressive cerebral and cerebellar atrophy and chronic small
vessel ischemic disease.

## 2019-02-19 IMAGING — DX PORTABLE CHEST - 1 VIEW
1 series · 1 of 1 positions shown · non-contrast
Comparison: [DATE]

CLINICAL DATA: Chest pain and shortness of breath. Patient fell 2
weeks ago and also yesterday.

EXAM:
PORTABLE CHEST 1 VIEW

[chest ap]
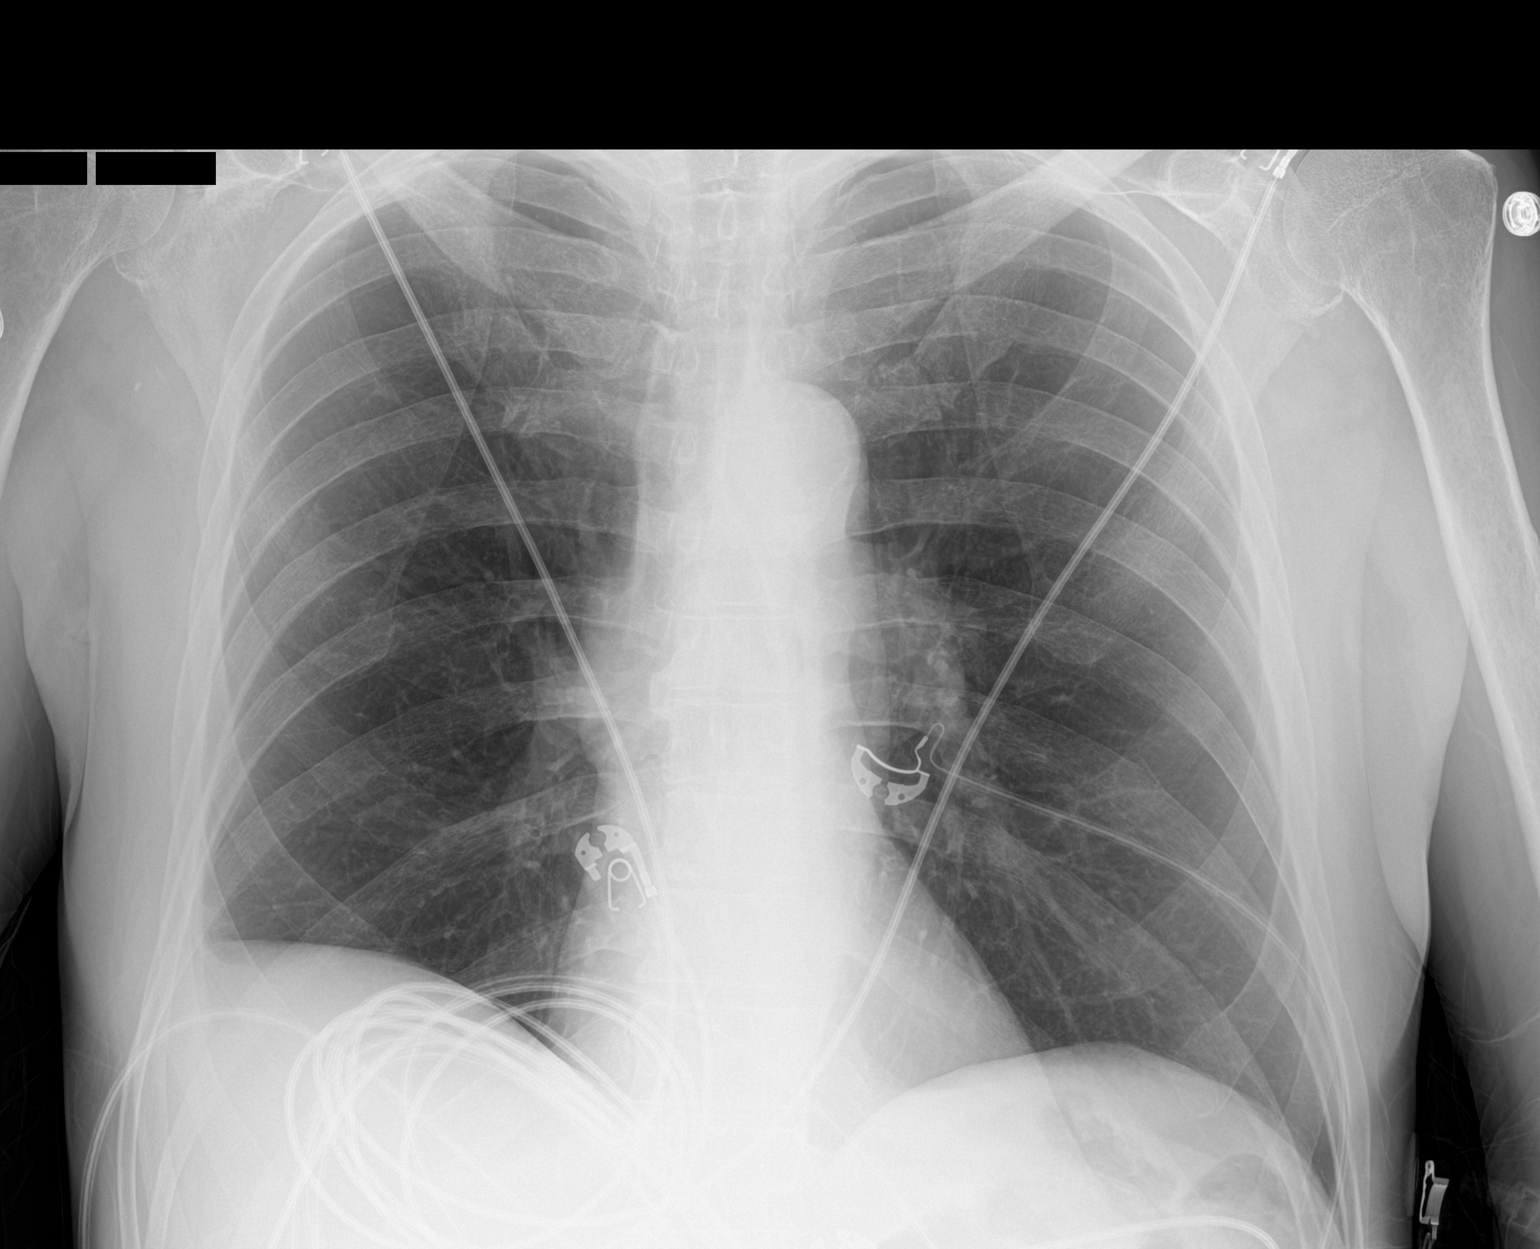

[1 of 1 positions shown; findings below may reference images not displayed]

FINDINGS: Heart size and vascularity are normal and the lungs are clear.
Chronic blunting of the right costophrenic angle laterally,
unchanged. No significant bone abnormality.
IMPRESSION: No acute abnormalities.

## 2019-02-19 MED ORDER — IOHEXOL 350 MG/ML SOLN
100.0000 mL | Freq: Once | INTRAVENOUS | Status: AC | PRN
  Administered 2019-02-19: 69 mL via INTRAVENOUS

## 2019-02-19 MED ORDER — VITAMIN B-12 1000 MCG PO TABS
1000.0000 ug | ORAL_TABLET | Freq: Every day | ORAL | Status: DC
  Administered 2019-02-20 – 2019-02-24 (×5): 1000 ug via ORAL
  Filled 2019-02-19 (×5): qty 1

## 2019-02-19 MED ORDER — ADULT MULTIVITAMIN W/MINERALS CH: 1.0000 | ORAL_TABLET | Freq: Every day | ORAL | Status: DC

## 2019-02-19 MED ORDER — ENOXAPARIN SODIUM 40 MG/0.4ML ~~LOC~~ SOLN
40.0000 mg | SUBCUTANEOUS | Status: DC
  Administered 2019-02-19 – 2019-02-23 (×5): 40 mg via SUBCUTANEOUS
  Filled 2019-02-19 (×5): qty 0.4

## 2019-02-19 MED ORDER — AMLODIPINE BESYLATE 10 MG PO TABS
10.0000 mg | ORAL_TABLET | Freq: Every day | ORAL | Status: DC
  Administered 2019-02-19 – 2019-02-24 (×6): 10 mg via ORAL
  Filled 2019-02-19 (×6): qty 1

## 2019-02-19 MED ORDER — HYDRALAZINE HCL 20 MG/ML IJ SOLN
10.0000 mg | Freq: Four times a day (QID) | INTRAMUSCULAR | Status: DC | PRN
  Administered 2019-02-19: 10 mg via INTRAVENOUS
  Filled 2019-02-19 (×2): qty 1

## 2019-02-19 MED ORDER — ALBUTEROL SULFATE (2.5 MG/3ML) 0.083% IN NEBU: 2.5000 mg | INHALATION_SOLUTION | Freq: Four times a day (QID) | RESPIRATORY_TRACT | Status: DC | PRN

## 2019-02-19 MED ORDER — LORAZEPAM 2 MG/ML IJ SOLN: 0.0000 mg | Freq: Two times a day (BID) | INTRAMUSCULAR | Status: AC

## 2019-02-19 MED ORDER — SODIUM CHLORIDE 0.9 % IV SOLN
INTRAVENOUS | Status: DC
  Administered 2019-02-19 – 2019-02-20 (×2): via INTRAVENOUS

## 2019-02-19 MED ORDER — ACETAMINOPHEN 650 MG RE SUPP: 650.0000 mg | Freq: Four times a day (QID) | RECTAL | Status: DC | PRN

## 2019-02-19 MED ORDER — LORAZEPAM 2 MG/ML IJ SOLN: 1.0000 mg | Freq: Four times a day (QID) | INTRAMUSCULAR | Status: AC | PRN

## 2019-02-19 MED ORDER — VITAMIN B-1 100 MG PO TABS: 100.0000 mg | ORAL_TABLET | Freq: Every day | ORAL | Status: DC

## 2019-02-19 MED ORDER — LORAZEPAM 1 MG PO TABS
0.0000 mg | ORAL_TABLET | Freq: Four times a day (QID) | ORAL | Status: AC
  Administered 2019-02-19 – 2019-02-20 (×4): 1 mg via ORAL
  Filled 2019-02-19 (×4): qty 1

## 2019-02-19 MED ORDER — FOLIC ACID 1 MG PO TABS: 1.0000 mg | ORAL_TABLET | Freq: Every day | ORAL | Status: DC

## 2019-02-19 MED ORDER — ONDANSETRON HCL 4 MG/2ML IJ SOLN: 4.0000 mg | Freq: Four times a day (QID) | INTRAMUSCULAR | Status: DC | PRN

## 2019-02-19 MED ORDER — THIAMINE HCL 100 MG/ML IJ SOLN: 100.0000 mg | Freq: Every day | INTRAMUSCULAR | Status: DC

## 2019-02-19 MED ORDER — LORAZEPAM 1 MG PO TABS
1.0000 mg | ORAL_TABLET | Freq: Four times a day (QID) | ORAL | Status: AC | PRN
  Administered 2019-02-22: 1 mg via ORAL
  Filled 2019-02-19: qty 1

## 2019-02-19 MED ORDER — THIAMINE HCL 100 MG/ML IJ SOLN
500.0000 mg | Freq: Every day | INTRAVENOUS | Status: DC
  Administered 2019-02-19 – 2019-02-20 (×2): 500 mg via INTRAVENOUS
  Filled 2019-02-19 (×2): qty 5

## 2019-02-19 MED ORDER — ONE-DAILY MULTI VITAMINS PO TABS: 1.0000 | ORAL_TABLET | Freq: Every day | ORAL | Status: DC

## 2019-02-19 MED ORDER — HYDROCODONE-ACETAMINOPHEN 5-325 MG PO TABS
1.0000 | ORAL_TABLET | ORAL | Status: DC | PRN
  Filled 2019-02-19: qty 1

## 2019-02-19 MED ORDER — LORAZEPAM 2 MG/ML IJ SOLN: 0.0000 mg | Freq: Four times a day (QID) | INTRAMUSCULAR | Status: AC

## 2019-02-19 MED ORDER — SODIUM CHLORIDE 0.9 % IV BOLUS
1000.0000 mL | Freq: Once | INTRAVENOUS | Status: AC
  Administered 2019-02-19: 1000 mL via INTRAVENOUS

## 2019-02-19 MED ORDER — ACETAMINOPHEN 325 MG PO TABS: 650.0000 mg | ORAL_TABLET | Freq: Four times a day (QID) | ORAL | Status: DC | PRN

## 2019-02-19 MED ORDER — LORAZEPAM 1 MG PO TABS
0.0000 mg | ORAL_TABLET | Freq: Two times a day (BID) | ORAL | Status: AC
  Administered 2019-02-22 – 2019-02-23 (×3): 1 mg via ORAL
  Filled 2019-02-19 (×3): qty 1

## 2019-02-19 MED ORDER — ACETAMINOPHEN 500 MG PO TABS: 500.0000 mg | ORAL_TABLET | Freq: Four times a day (QID) | ORAL | Status: DC | PRN

## 2019-02-19 MED ORDER — FOLIC ACID 1 MG PO TABS
1.0000 mg | ORAL_TABLET | Freq: Every day | ORAL | Status: DC
  Administered 2019-02-20 – 2019-02-24 (×5): 1 mg via ORAL
  Filled 2019-02-19 (×5): qty 1

## 2019-02-19 MED ORDER — SENNOSIDES-DOCUSATE SODIUM 8.6-50 MG PO TABS: 1.0000 | ORAL_TABLET | Freq: Every evening | ORAL | Status: DC | PRN

## 2019-02-19 MED ORDER — ADULT MULTIVITAMIN W/MINERALS CH
1.0000 | ORAL_TABLET | Freq: Every day | ORAL | Status: DC
  Administered 2019-02-19 – 2019-02-24 (×6): 1 via ORAL
  Filled 2019-02-19 (×6): qty 1

## 2019-02-19 MED ORDER — FOLIC ACID 1 MG PO TABS
1.0000 mg | ORAL_TABLET | Freq: Once | ORAL | Status: AC
  Administered 2019-02-19: 1 mg via ORAL
  Filled 2019-02-19: qty 1

## 2019-02-19 MED ORDER — SODIUM CHLORIDE (PF) 0.9 % IJ SOLN
INTRAMUSCULAR | Status: AC
  Administered 2019-02-19: 16:00:00
  Filled 2019-02-19: qty 50

## 2019-02-19 MED ORDER — ONDANSETRON HCL 4 MG PO TABS: 4.0000 mg | ORAL_TABLET | Freq: Four times a day (QID) | ORAL | Status: DC | PRN

## 2019-02-19 MED ORDER — BENZONATATE 100 MG PO CAPS
100.0000 mg | ORAL_CAPSULE | Freq: Three times a day (TID) | ORAL | Status: DC
  Administered 2019-02-19 – 2019-02-24 (×15): 100 mg via ORAL
  Filled 2019-02-19 (×15): qty 1

## 2019-02-19 NOTE — Progress Notes (Signed)
Attempted to call and get report at 1710.

## 2019-02-19 NOTE — ED Notes (Signed)
Report given to Wilson.

## 2019-02-19 NOTE — ED Notes (Signed)
Patient transported to CT 

## 2019-02-19 NOTE — ED Notes (Signed)
PA made aware of trending BP.
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
## 2019-02-19 NOTE — ED Triage Notes (Signed)
Per EMS- patient is from home. Patient reported a fall2 weeks ago and again yesterday. Patient c/o weakness bilateral lower extremities. Patient c/o left knee pain from the fall. Patient also reports a previous stroke history with right -sided weakness. Patient has an appointment in July regarding left knee pain, but needed to come today due to pain.

## 2019-02-19 NOTE — ED Notes (Signed)
Bed: WTR5 Expected date:  Expected time:  Means of arrival:  Comments: 

## 2019-02-19 NOTE — ED Notes (Signed)
Respiratory made aware of VBG. 

## 2019-02-20 LAB — CBC
HCT: 32.2 % — ABNORMAL LOW (ref 39.0–52.0)
Hemoglobin: 10.6 g/dL — ABNORMAL LOW (ref 13.0–17.0)
MCH: 34.5 pg — ABNORMAL HIGH (ref 26.0–34.0)
MCHC: 32.9 g/dL (ref 30.0–36.0)
MCV: 104.9 fL — ABNORMAL HIGH (ref 80.0–100.0)
Platelets: 170 10*3/uL (ref 150–400)
RBC: 3.07 MIL/uL — ABNORMAL LOW (ref 4.22–5.81)
RDW: 17.3 % — ABNORMAL HIGH (ref 11.5–15.5)
WBC: 6.4 10*3/uL (ref 4.0–10.5)
nRBC: 0.6 % — ABNORMAL HIGH (ref 0.0–0.2)

## 2019-02-20 LAB — COMPREHENSIVE METABOLIC PANEL
ALT: 23 U/L (ref 0–44)
AST: 101 U/L — ABNORMAL HIGH (ref 15–41)
Albumin: 3.7 g/dL (ref 3.5–5.0)
Alkaline Phosphatase: 104 U/L (ref 38–126)
Anion gap: 13 (ref 5–15)
BUN: 7 mg/dL — ABNORMAL LOW (ref 8–23)
CO2: 25 mmol/L (ref 22–32)
Calcium: 9 mg/dL (ref 8.9–10.3)
Chloride: 99 mmol/L (ref 98–111)
Creatinine, Ser: 0.86 mg/dL (ref 0.61–1.24)
GFR calc Af Amer: >60 mL/min (ref >60)
GFR calc non Af Amer: >60 mL/min (ref >60)
Glucose, Bld: 105 mg/dL — ABNORMAL HIGH (ref 70–99)
Potassium: 4 mmol/L (ref 3.5–5.1)
Sodium: 137 mmol/L (ref 135–145)
Total Bilirubin: 1.5 mg/dL — ABNORMAL HIGH (ref 0.3–1.2)
Total Protein: 6.9 g/dL (ref 6.5–8.1)

## 2019-02-20 LAB — MAGNESIUM: Magnesium: 1.4 mg/dL — ABNORMAL LOW (ref 1.7–2.4)

## 2019-02-20 LAB — FOLATE RBC
Folate, Hemolysate: 262 ng/mL
Folate, RBC: 919 ng/mL (ref >498)
Hematocrit: 28.5 % — ABNORMAL LOW (ref 37.5–51.0)

## 2019-02-20 LAB — HIV ANTIBODY (ROUTINE TESTING W REFLEX): HIV Screen 4th Generation wRfx: NONREACTIVE

## 2019-02-20 MED ORDER — VITAMIN B-1 100 MG PO TABS
100.0000 mg | ORAL_TABLET | Freq: Every day | ORAL | Status: DC
  Administered 2019-02-21 – 2019-02-24 (×4): 100 mg via ORAL
  Filled 2019-02-20 (×4): qty 1

## 2019-02-20 MED ORDER — IPRATROPIUM-ALBUTEROL 0.5-2.5 (3) MG/3ML IN SOLN
3.0000 mL | Freq: Three times a day (TID) | RESPIRATORY_TRACT | Status: DC
  Administered 2019-02-20: 3 mL via RESPIRATORY_TRACT
  Filled 2019-02-20: qty 3

## 2019-02-20 MED ORDER — NICOTINE 21 MG/24HR TD PT24
21.0000 mg | MEDICATED_PATCH | Freq: Every day | TRANSDERMAL | Status: DC
  Administered 2019-02-20 – 2019-02-24 (×5): 21 mg via TRANSDERMAL
  Filled 2019-02-20 (×5): qty 1

## 2019-02-20 MED ORDER — MAGNESIUM SULFATE 2 GM/50ML IV SOLN
2.0000 g | Freq: Once | INTRAVENOUS | Status: AC
  Administered 2019-02-20: 12:00:00 2 g via INTRAVENOUS
  Filled 2019-02-20: qty 50

## 2019-02-20 MED ORDER — UMECLIDINIUM BROMIDE 62.5 MCG/INH IN AEPB
1.0000 | INHALATION_SPRAY | Freq: Every day | RESPIRATORY_TRACT | Status: DC
  Administered 2019-02-21 – 2019-02-24 (×4): 1 via RESPIRATORY_TRACT
  Filled 2019-02-20 (×2): qty 7

## 2019-02-20 MED ORDER — ALBUTEROL SULFATE (2.5 MG/3ML) 0.083% IN NEBU
2.5000 mg | INHALATION_SOLUTION | Freq: Two times a day (BID) | RESPIRATORY_TRACT | Status: DC
  Administered 2019-02-20 – 2019-02-21 (×2): 2.5 mg via RESPIRATORY_TRACT
  Filled 2019-02-20 (×2): qty 3

## 2019-02-20 NOTE — Progress Notes (Signed)
   02/20/19 1300  What Happened  Was fall witnessed? No  Was patient injured? No  Patient found on floor  Found by Staff-comment Marye Round, [place])  Stated prior activity ambulating-unassisted  Follow Up  MD notified ezenduka  Time MD notified 1341  Family notified No- patient refusal (pt will notify his sister)  Adult Fall Risk Assessment  Risk Factor Category (scoring not indicated) Fall has occurred during this admission (document High fall risk)  Patient Fall Risk Level High fall risk  Adult Fall Risk Interventions  Required Bundle Interventions *See Row Information* High fall risk - low, moderate, and high requirements implemented  Additional Interventions Use of appropriate toileting equipment (bedpan, BSC, etc.)  Screening for Fall Injury Risk (To be completed on HIGH fall risk patients) - Assessing Need for Low Bed  Risk For Fall Injury- Low Bed Criteria None identified - Continue screening  Screening for Fall Injury Risk (To be completed on HIGH fall risk patients who do not meet crieteria for Low Bed) - Assessing Need for Floor Mats Only  Risk For Fall Injury- Criteria for Floor Mats None identified - No additional interventions needed  Pain Assessment  Pain Scale 0-10  Pain Score 0  Neurological  Neuro (WDL) WDL  Musculoskeletal  Musculoskeletal (WDL) X  Generalized Weakness Yes  Weight Bearing Restrictions No  Musculoskeletal Details  LLE Limited movement  LLE Ortho/Supportive Device Knee Immobilizer  Integumentary  Integumentary (WDL) WDL

## 2019-02-20 NOTE — Progress Notes (Signed)
PROGRESS NOTE  Seth [name] XBD:[id number] DOB: 03-08-1956 DOA: 02/19/2019 PCP: Mack Hook, MD  HPI/Recap of past 24 hours: HPI from Dr [name] is a 63 y.o. male with medical history significant of hypertension, alcohol dependence, tobacco abuse, COPD came to emergency department with complaints of weakness and falls. Patient has been having trouble balancing since he does not feel when he walks and this has been going on for about 2 months.  He has been feeling significantly weak especially in his lower extremities for past 2 weeks.  He had a fall 2 weeks ago only because he lost his balance and landed on his left knee. Pt continued to get worse and he had another fall once again due to losing balance and once again he landed on his left knee.  He came to the emergency department today to seek further care.  Patient has also been having intermittent shortness of breath with exertion. Pt denies any chest pain, dizziness, headache, fever, chills, sweating, nausea, vomiting, diarrhea, any problem with urination or bowel movements, any recent travel or sick contact.  He further tells me that he had a stroke about a year ago which left him with right-sided weakness which has improved significantly over time and he seems to have equal strength in all 4 extremities on exam. In the ED, patient was significantly hypertensive with a systolic around 834 and diastolic around 196.  Had mild tachycardia. BMP showed mild hyperkalemia 5.2.  D-dimer was elevated which was followed with CT angiogram of the chest which ruled out PE.  He has a stable dilated ascending aorta.  Also 6 mm nodular opacity in the right lobe.  No consolidation or CHF findings.  First troponin negative.  BNP normal.  Left knee x-ray showed proximal fibular fracture.  Patient admitted for further management.    Today, patient reported feeling about the same, still with lower extremity weakness and pain in his left knee.   Denies any chest pain, shortness of breath, nausea/vomiting, abdominal pain, fever/chills.  Assessment/Plan: Active Problems:   Chest pain   COPD exacerbation (HCC)   Generalized weakness   Hypertensive urgency   Hyperkalemia   Left fibular fracture   Chronic alcoholism (HCC)  Generalized weakness/frequent falls/loss of balance ?? 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 Maureen Ralphs from orthopedics, recommended seeing patient on knee immobilizer and follow up as an outpatient Knee immobilizer in place PT/OT Follow-up with Ortho as an outpatient  Hypertensive urgency Medication noncompliance, in addition to ?pain Continue amlodipine 10 mg and start PRN hydralazine Pain management  Hypomagnesemia Replace PRN  Chronic macrocytic anemia B12 as above, folate level pending Supplementation as above Daily CBC  COPD Stable Continue inhaler, DuoNeb  Chronic alcoholism No signs of withdrawal at this point in time CIWA protocol Monitor closely  Alcohol/tobacco abuse About half a pint of vodka daily Smokes half PPD CIWA protocol, advised to quit Nicotine patch          Malnutrition Type:      Malnutrition Characteristics:      Nutrition Interventions:       Estimated body mass index is 19.25 kg/m as calculated from the following:   Height as of this encounter: 6\' 2"  (1.88 m).   Weight as of this encounter:  68 kg.     Code Status: Full  Family Communication: None at bedside  Disposition Plan: To be determined by PT/OT   Consultants:  EDP spoke to Dr. Maureen Ralphs orthopedics  Procedures:  None  Antimicrobials:  None  DVT prophylaxis: Lovenox   Objective: Vitals:   02/19/19 1807 02/19/19 2156  02/20/19 0500 02/20/19 0512  BP: (!) 145/106 (!) 158/97  (!) 143/91  Pulse: (!) 122 99  (!) 109  Resp: 18 18  18   Temp: 99 F (37.2 C) 98.8 F (37.1 C)  98.6 F (37 C)  TempSrc: Oral Oral  Oral  SpO2: 100% 100%  100%  Weight:   68 kg   Height:        Intake/Output Summary (Last 24 hours) at 02/20/2019 1123 Last data filed at 02/20/2019 0300 Gross per 24 hour  Intake 1828.49 ml  Output --  Net 1828.49 ml   Filed Weights   02/19/19 1042 02/20/19 0500  Weight: 68 kg 68 kg    Exam:  General: NAD   Cardiovascular: S1, S2 present  Respiratory:  Diminished breath sounds bilaterally  Abdomen: Soft, nontender, nondistended, bowel sounds present  Musculoskeletal: No bilateral pedal edema noted  Skin: Normal  Psychiatry: Normal mood   Data Reviewed: CBC: Recent Labs  Lab 02/19/19 1301 02/19/19 1858 02/20/19 0612  WBC 7.3 5.7 6.4  NEUTROABS 5.1  --   --   HGB 10.2* 9.8* 10.6*  HCT 30.8* 30.0* 32.2*  MCV 105.5* 105.6* 104.9*  PLT 166 152 759   Basic Metabolic Panel: Recent Labs  Lab 02/19/19 1301 02/19/19 1858 02/20/19 0612  NA 137  --  137  K 5.2*  --  4.0  CL 94*  --  99  CO2 22  --  25  GLUCOSE 97  --  105*  BUN 12  --  7*  CREATININE 1.12 0.90 0.86  CALCIUM 9.3  --  9.0  MG  --  1.6* 1.4*   GFR: Estimated Creatinine Clearance: 86.8 mL/min (by C-G formula based on SCr of 0.86 mg/dL). Liver Function Tests: Recent Labs  Lab 02/19/19 1301 02/20/19 0612  AST 72* 101*  ALT 21 23  ALKPHOS 106 104  BILITOT 2.1* 1.5*  PROT 7.4 6.9  ALBUMIN 4.1 3.7   No results for input(s): LIPASE, AMYLASE in the last 168 hours. No results for input(s): AMMONIA in the last 168 hours. Coagulation Profile: No results for input(s): INR, PROTIME in the last 168 hours. Cardiac Enzymes: No results for input(s): CKTOTAL, CKMB, CKMBINDEX, TROPONINI in the last 168 hours. BNP (last 3 results) No results for input(s): PROBNP in the last 8760 hours. HbA1C: No results  for input(s): HGBA1C in the last 72 hours. CBG: No results for input(s): GLUCAP in the last 168 hours. Lipid Profile: No results for input(s): CHOL, HDL, LDLCALC, TRIG, CHOLHDL, LDLDIRECT in the last 72 hours. Thyroid Function Tests: Recent Labs    02/19/19 1858  TSH 2.197   Anemia Panel: Recent Labs    02/19/19 1858  VITAMINB12 389   Urine analysis:    Component Value Date/Time   COLORURINE YELLOW 08/23/2017 1530   APPEARANCEUR CLEAR 08/23/2017 1530   LABSPEC 1.004 (L) 08/23/2017 1530   PHURINE 6.0 08/23/2017 1530   GLUCOSEU NEGATIVE 08/23/2017 1530   HGBUR SMALL (A) 08/23/2017 1530   BILIRUBINUR NEGATIVE 08/23/2017 [place] 08/23/2017 1530   PROTEINUR NEGATIVE 08/23/2017 1530   NITRITE NEGATIVE 08/23/2017 1530   LEUKOCYTESUR NEGATIVE 08/23/2017 1530  Sepsis Labs: @LABRCNTIP (procalcitonin:4,lacticidven:4)  ) Recent Results (from the past 240 hour(s))  SARS Coronavirus 2 (CEPHEID - Performed in [place] hospital lab), Hosp Order     Status: None   Collection Time: 02/19/19  3:17 PM   Specimen: Nasopharyngeal Swab  Result Value Ref Range Status   SARS Coronavirus 2 NEGATIVE NEGATIVE Final    Comment: (NOTE) If result is NEGATIVE SARS-CoV-2 target nucleic acids are NOT DETECTED. The SARS-CoV-2 RNA is generally detectable in upper and lower  respiratory specimens during the acute phase of infection. The lowest  concentration of SARS-CoV-2 viral copies this assay can detect is 250  copies / mL. A negative result does not preclude SARS-CoV-2 infection  and should not be used as the sole basis for treatment or other  patient management decisions.  A negative result may occur with  improper specimen collection / handling, submission of specimen other  than nasopharyngeal swab, presence of viral mutation(s) within the  areas targeted by this assay, and inadequate number of viral copies  (<250 copies / mL). A negative result must be combined with  clinical  observations, patient history, and epidemiological information. If result is POSITIVE SARS-CoV-2 target nucleic acids are DETECTED. The SARS-CoV-2 RNA is generally detectable in upper and lower  respiratory specimens dur ing the acute phase of infection.  Positive  results are indicative of active infection with SARS-CoV-2.  Clinical  correlation with patient history and other diagnostic information is  necessary to determine patient infection status.  Positive results do  not rule out bacterial infection or co-infection with other viruses. If result is PRESUMPTIVE POSTIVE SARS-CoV-2 nucleic acids MAY BE PRESENT.   A presumptive positive result was obtained on the submitted specimen  and confirmed on repeat testing.  While 2019 novel coronavirus  (SARS-CoV-2) nucleic acids may be present in the submitted sample  additional confirmatory testing may be necessary for epidemiological  and / or clinical management purposes  to differentiate between  SARS-CoV-2 and other Sarbecovirus currently known to infect humans.  If clinically indicated additional testing with an alternate test  methodology [phone]) is advised. The SARS-CoV-2 RNA is generally  detectable in upper and lower respiratory sp ecimens during the acute  phase of infection. The expected result is Negative. Fact Sheet for Patients:  StrictlyIdeas.no Fact Sheet for Healthcare Providers: BankingDealers.co.za This test is not yet approved or cleared by the [place] FDA and has been authorized for detection and/or diagnosis of SARS-CoV-2 by FDA under an Emergency Use Authorization (EUA).  This EUA will remain in effect (meaning this test can be used) for the duration of the COVID-19 declaration under Section 564(b)(1) of the Act, 21 U.S.C. section 360bbb-3(b)(1), unless the authorization is terminated or revoked sooner. Performed at [facility],  [place] [address], [place], [place] [address]       Studies: Ct Head Wo Contrast  Result Date: 02/19/2019 CLINICAL DATA:  Recurrent syncope. Fell 2 weeks ago and again yesterday. Bilateral lower extremity weakness. EXAM: CT HEAD WITHOUT CONTRAST TECHNIQUE: Contiguous axial images were obtained from the base of the skull through the vertex without intravenous contrast. COMPARISON:  05/12/2009 FINDINGS: Brain: There is no evidence of acute infarct, intracranial hemorrhage, mass, midline shift, or extra-axial fluid collection. Mildly age advanced cerebral atrophy has progressed from the prior study. There is also advanced cerebellar atrophy with multiple small chronic bilateral cerebellar infarcts again noted. Bilateral cerebral white matter hypodensities have also progressed and are nonspecific but compatible with mild  chronic small vessel ischemic disease. Vascular: Calcified atherosclerosis at the skull base. No hyperdense vessel. Skull: No fracture or focal osseous lesion. Sinuses/Orbits: Small right maxillary sinus mucous retention cyst. Clear mastoid air cells. Unremarkable orbits. Other: None. IMPRESSION: 1. No evidence of acute intracranial abnormality. 2. Progressive cerebral and cerebellar atrophy and chronic small vessel ischemic disease. Electronically Signed   By: Logan Bores M.D.   On: 02/19/2019 14:10   Ct Angio Chest Pe W And/or Wo Contrast  Result Date: 02/19/2019 CLINICAL DATA:  Shortness of breath EXAM: CT ANGIOGRAPHY CHEST WITH CONTRAST TECHNIQUE: Multidetector CT imaging of the chest was performed using the standard protocol during bolus administration of intravenous contrast. Multiplanar CT image reconstructions and MIPs were obtained to evaluate the vascular anatomy. CONTRAST:  63mL OMNIPAQUE IOHEXOL 350 MG/ML SOLN COMPARISON:  Chest CT August 23, 2017 and chest CT July 13, 2016; chest radiograph February 19, 2019 FINDINGS: Cardiovascular: There is no demonstrable pulmonary embolus.  Ascending thoracic aorta measures 4.0 x 4.0 cm in maximal transverse diameter. No dissection evident. Visualized great vessels appear unremarkable. There are foci of aortic atherosclerosis as well as foci of coronary artery calcification. There is no pericardial effusion or pericardial thickening evident. Mediastinum/Nodes: Thyroid appears unremarkable. There is no appreciable thoracic adenopathy. No esophageal lesions are evident. Lungs/Pleura: There is a stable 6 x 6 mm nodular opacity abutting the pleura in the posterior segment of the right upper lobe. There is mild centrilobular emphysematous change. There is no evident edema or consolidation. Upper Abdomen: There is hepatic steatosis. Visualized upper abdominal structures otherwise appear unremarkable except for aortic atherosclerosis. Musculoskeletal: Old healed fracture of the sternum again noted. There are no blastic or lytic bone lesions. No chest wall lesions are evident. Review of the MIP images confirms the above findings. IMPRESSION: 1.  No demonstrable pulmonary embolus. 2. Ascending thoracic aortic diameter measures 4.0 x 4.0 cm, essentially stable. No dissection evident. There is aortic atherosclerosis as well as foci of coronary artery calcification. Recommend annual imaging followup by CTA or MRA. This recommendation follows 2010 ACCF/AHA/AATS/ACR/ASA/SCA/SCAI/SIR/STS/SVM Guidelines for the Diagnosis and Management of Patients with Thoracic Aortic Disease. Circulation. 2010; 121: A768-T157. Aortic aneurysm NOS (ICD10-I71.9). 3. Stable 6 mm nodular opacity right upper lobe. Stability over time since 2017 is indicative of benign etiology. Mild underlying emphysematous change. No edema or consolidation. 4.  No evident adenopathy. 5.  Hepatic steatosis. Aortic Atherosclerosis (ICD10-I70.0) and Emphysema (ICD10-J43.9). Electronically Signed   By: Lowella Grip III M.D.   On: 02/19/2019 15:27   Dg Chest Port 1 View  Result Date:  02/19/2019 CLINICAL DATA:  Chest pain and shortness of breath. Patient fell 2 weeks ago and also yesterday. EXAM: PORTABLE CHEST 1 VIEW COMPARISON:  08/23/2017 FINDINGS: Heart size and vascularity are normal and the lungs are clear. Chronic blunting of the right costophrenic angle laterally, unchanged. No significant bone abnormality. IMPRESSION: No acute abnormalities. Electronically Signed   By: Lorriane Shire M.D.   On: 02/19/2019 14:06   Dg Knee Complete 4 Views Left  Result Date: 02/19/2019 CLINICAL DATA:  Left knee pain after fall yesterday. EXAM: LEFT KNEE - COMPLETE 4+ VIEW COMPARISON:  None. FINDINGS: Probable nondisplaced fracture is seen involving the proximal left fibula. No other fracture or dislocation is noted. No evidence of arthropathy or other focal bone abnormality. Vascular calcifications are noted. IMPRESSION: Probable nondisplaced fracture involving the proximal left fibula. Electronically Signed   By: Marijo Conception M.D.   On: 02/19/2019 11:52  Scheduled Meds:  amLODipine  10 mg Oral Daily   benzonatate  100 mg Oral Q8H   enoxaparin (LOVENOX) injection  40 mg Subcutaneous B79M   folic acid  1 mg Oral Daily   LORazepam  0-4 mg Intravenous Q6H   Or   LORazepam  0-4 mg Oral Q6H   [START ON 02/21/2019] LORazepam  0-4 mg Intravenous Q12H   Or   [START ON 02/21/2019] LORazepam  0-4 mg Oral Q12H   multivitamin with minerals  1 tablet Oral Daily   vitamin B-12  1,000 mcg Oral Daily    Continuous Infusions:  thiamine injection 500 mg (02/20/19 0826)     LOS: 1 day     Alma Friendly, MD [facility]  If 7PM-7AM, please contact night-coverage [email or web address] 02/20/2019, 11:23 AM

## 2019-02-21 LAB — CBC WITH DIFFERENTIAL/PLATELET
Abs Immature Granulocytes: 0.03 10*3/uL (ref 0.00–0.07)
Basophils Absolute: 0 10*3/uL (ref 0.0–0.1)
Basophils Relative: 0 %
Eosinophils Absolute: 0.1 10*3/uL (ref 0.0–0.5)
Eosinophils Relative: 2 %
HCT: 29.7 % — ABNORMAL LOW (ref 39.0–52.0)
Hemoglobin: 9.4 g/dL — ABNORMAL LOW (ref 13.0–17.0)
Immature Granulocytes: 1 %
Lymphocytes Relative: 14 %
Lymphs Abs: 0.8 10*3/uL (ref 0.7–4.0)
MCH: 34.1 pg — ABNORMAL HIGH (ref 26.0–34.0)
MCHC: 31.6 g/dL (ref 30.0–36.0)
MCV: 107.6 fL — ABNORMAL HIGH (ref 80.0–100.0)
Monocytes Absolute: 0.5 10*3/uL (ref 0.1–1.0)
Monocytes Relative: 8 %
Neutro Abs: 4.6 10*3/uL (ref 1.7–7.7)
Neutrophils Relative %: 75 %
Platelets: 158 10*3/uL (ref 150–400)
RBC: 2.76 MIL/uL — ABNORMAL LOW (ref 4.22–5.81)
RDW: 17.2 % — ABNORMAL HIGH (ref 11.5–15.5)
WBC: 6.1 10*3/uL (ref 4.0–10.5)
nRBC: 0.3 % — ABNORMAL HIGH (ref 0.0–0.2)

## 2019-02-21 LAB — MAGNESIUM: Magnesium: 1.9 mg/dL (ref 1.7–2.4)

## 2019-02-21 LAB — BASIC METABOLIC PANEL
Anion gap: 11 (ref 5–15)
BUN: <5 mg/dL — ABNORMAL LOW (ref 8–23)
CO2: 24 mmol/L (ref 22–32)
Calcium: 9.1 mg/dL (ref 8.9–10.3)
Chloride: 101 mmol/L (ref 98–111)
Creatinine, Ser: 0.69 mg/dL (ref 0.61–1.24)
GFR calc Af Amer: >60 mL/min (ref >60)
GFR calc non Af Amer: >60 mL/min (ref >60)
Glucose, Bld: 94 mg/dL (ref 70–99)
Potassium: 3.6 mmol/L (ref 3.5–5.1)
Sodium: 136 mmol/L (ref 135–145)

## 2019-02-21 MED ORDER — SODIUM CHLORIDE 0.9 % IV SOLN
INTRAVENOUS | Status: DC
  Administered 2019-02-21 – 2019-02-22 (×2): via INTRAVENOUS

## 2019-02-21 NOTE — Progress Notes (Signed)
Patients heart rate has consistently been 120's-150's . MD paged to make aware.

## 2019-02-21 NOTE — Progress Notes (Signed)
PROGRESS NOTE  Seth [name]:[id number] DOB: 10-Oct-1955 DOA: 02/19/2019 PCP: Mack Hook, MD  HPI/Recap of past 24 hours: HPI from Dr [name] is a 63 y.o. male with medical history significant of hypertension, alcohol dependence, tobacco abuse, COPD came to emergency department with complaints of weakness and falls. Patient has been having trouble balancing since he does not feel when he walks and this has been going on for about 2 months.  He has been feeling significantly weak especially in his lower extremities for past 2 weeks.  He had a fall 2 weeks ago only because he lost his balance and landed on his left knee. Pt continued to get worse and he had another fall once again due to losing balance and once again he landed on his left knee.  He came to the emergency department today to seek further care.  Patient has also been having intermittent shortness of breath with exertion. Pt denies any chest pain, dizziness, headache, fever, chills, sweating, nausea, vomiting, diarrhea, any problem with urination or bowel movements, any recent travel or sick contact.  He further tells me that he had a stroke about a year ago which left him with right-sided weakness which has improved significantly over time and he seems to have equal strength in all 4 extremities on exam. In the ED, patient was significantly hypertensive with a systolic around 811 and diastolic around 914.  Had mild tachycardia. BMP showed mild hyperkalemia 5.2.  D-dimer was elevated which was followed with CT angiogram of the chest which ruled out PE.  He has a stable dilated ascending aorta.  Also 6 mm nodular opacity in the right lobe.  No consolidation or CHF findings.  First troponin negative.  BNP normal.  Left knee x-ray showed proximal fibular fracture.  Patient admitted for further management.    Today, patient reported feeling better, was able to eat more.  Noted to be significantly tachycardic, patient  asymptomatic.  Patient denies any chest pain, shortness of breath, palpitations, fever/chills.  Assessment/Plan: Active Problems:   Chest pain   COPD exacerbation (HCC)   Generalized weakness   Hypertensive urgency   Hyperkalemia   Left fibular fracture   Chronic alcoholism (HCC)  Generalized weakness/frequent falls/loss of balance ?? 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  Dehydration, ?? Early withdrawal, ??Pain EKG with sinus tachycardia CTA chest negative for PE IV fluids CIWA protocol Telemetry, monitor closely  Left fibular non-displaced fracture Spoke to Dr. Maureen Ralphs from orthopedics on 02/21/19, recommended placing patient on knee immobilizer which can be taken off while in bed, weight bearing as tolerated Knee immobilizer in place PT/OT Follow-up with Ortho as an outpatient  Hypertensive urgency Medication noncompliance, in addition to ?pain Continue amlodipine 10 mg and start PRN hydralazine Pain management  Hypomagnesemia Replace PRN  Chronic macrocytic anemia B12 as above, folate level pending Supplementation as above Daily CBC  COPD Stable Continue inhaler, DuoNeb  Chronic alcoholism CIWA protocol Monitor closely  Alcohol/tobacco abuse About half a pint of vodka daily Smokes half PPD CIWA protocol, advised to quit Nicotine patch          Malnutrition Type:      Malnutrition Characteristics:      Nutrition Interventions:       Estimated body mass index is  19.25 kg/m as calculated from the following:   Height as of this encounter: 6\' 2"  (1.88 m).   Weight as of this encounter: 68 kg.     Code Status: Full  Family Communication: Spoke to sister on 02/20/19  Disposition Plan: Likely home once  stable   Consultants:  Spoke to Dr. Maureen Ralphs orthopedics  Procedures:  None  Antimicrobials:  None  DVT prophylaxis: Lovenox   Objective: Vitals:   02/21/19 0728 02/21/19 0914 02/21/19 1228 02/21/19 1730  BP: (!) 136/98  (!) 144/98 126/84  Pulse: (!) 107  (!) 129 (!) 117  Resp:   19 18  Temp:   99.1 F (37.3 C) 99 F (37.2 C)  TempSrc:   Oral Oral  SpO2: 100% 99% 100% 100%  Weight:      Height:        Intake/Output Summary (Last 24 hours) at 02/21/2019 1743 Last data filed at 02/21/2019 1230 Gross per 24 hour  Intake 600 ml  Output 2025 ml  Net -1425 ml   Filed Weights   02/19/19 1042 02/20/19 0500  Weight: 68 kg 68 kg    Exam:  General: NAD   Cardiovascular: S1, S2 present  Respiratory:  Diminished breath sounds bilaterally  Abdomen: Soft, nontender, nondistended, bowel sounds present  Musculoskeletal: No bilateral pedal edema noted  Skin: Normal  Psychiatry: Normal mood   Data Reviewed: CBC: Recent Labs  Lab 02/19/19 1301 02/19/19 1858 02/20/19 0612 02/21/19 0554  WBC 7.3 5.7 6.4 6.1  NEUTROABS 5.1  --   --  4.6  HGB 10.2* 9.8* 10.6* 9.4*  HCT 30.8* 30.0*  28.5* 32.2* 29.7*  MCV 105.5* 105.6* 104.9* 107.6*  PLT 166 152 170 175   Basic Metabolic Panel: Recent Labs  Lab 02/19/19 1301 02/19/19 1858 02/20/19 0612 02/21/19 0554  NA 137  --  137 136  K 5.2*  --  4.0 3.6  CL 94*  --  99 101  CO2 22  --  25 24  GLUCOSE 97  --  105* 94  BUN 12  --  7* <5*  CREATININE 1.12 0.90 0.86 0.69  CALCIUM 9.3  --  9.0 9.1  MG  --  1.6* 1.4* 1.9   GFR: Estimated Creatinine Clearance: 93.3 mL/min (by C-G formula based on SCr of 0.69 mg/dL). Liver Function Tests: Recent Labs  Lab 02/19/19 1301 02/20/19 0612  AST 72* 101*  ALT 21 23  ALKPHOS 106 104  BILITOT 2.1* 1.5*  PROT 7.4 6.9  ALBUMIN 4.1 3.7   No results for input(s): LIPASE, AMYLASE in the last 168 hours. No results for input(s): AMMONIA in the last 168 hours. Coagulation  Profile: No results for input(s): INR, PROTIME in the last 168 hours. Cardiac Enzymes: No results for input(s): CKTOTAL, CKMB, CKMBINDEX, TROPONINI in the last 168 hours. BNP (last 3 results) No results for input(s): PROBNP in the last 8760 hours. HbA1C: No results for input(s): HGBA1C in the last 72 hours. CBG: No results for input(s): GLUCAP in the last 168 hours. Lipid Profile: No results for input(s): CHOL, HDL, LDLCALC, TRIG, CHOLHDL, LDLDIRECT in the last 72 hours. Thyroid Function Tests: Recent Labs    02/19/19 1858  TSH 2.197   Anemia Panel: Recent Labs    02/19/19 1858  VITAMINB12 389   Urine analysis:    Component Value Date/Time   COLORURINE YELLOW 08/23/2017 1530   APPEARANCEUR CLEAR 08/23/2017 1530   LABSPEC 1.004 (L) 08/23/2017 1530   PHURINE 6.0 08/23/2017 1530  GLUCOSEU NEGATIVE 08/23/2017 1530   HGBUR SMALL (A) 08/23/2017 1530   BILIRUBINUR NEGATIVE 08/23/2017 1530   KETONESUR NEGATIVE 08/23/2017 1530   PROTEINUR NEGATIVE 08/23/2017 1530   NITRITE NEGATIVE 08/23/2017 1530   LEUKOCYTESUR NEGATIVE 08/23/2017 1530   Sepsis Labs: @LABRCNTIP (procalcitonin:4,lacticidven:4)  ) Recent Results (from the past 240 hour(s))  SARS Coronavirus 2 (CEPHEID - Performed in [facility] hospital lab), Hosp Order     Status: None   Collection Time: 02/19/19  3:17 PM   Specimen: Nasopharyngeal Swab  Result Value Ref Range Status   SARS Coronavirus 2 NEGATIVE NEGATIVE Final    Comment: (NOTE) If result is NEGATIVE SARS-CoV-2 target nucleic acids are NOT DETECTED. The SARS-CoV-2 RNA is generally detectable in upper and lower  respiratory specimens during the acute phase of infection. The lowest  concentration of SARS-CoV-2 viral copies this assay can detect is 250  copies / mL. A negative result does not preclude SARS-CoV-2 infection  and should not be used as the sole basis for treatment or other  patient management decisions.  A negative result may occur with   improper specimen collection / handling, submission of specimen other  than nasopharyngeal swab, presence of viral mutation(s) within the  areas targeted by this assay, and inadequate number of viral copies  (<250 copies / mL). A negative result must be combined with clinical  observations, patient history, and epidemiological information. If result is POSITIVE SARS-CoV-2 target nucleic acids are DETECTED. The SARS-CoV-2 RNA is generally detectable in upper and lower  respiratory specimens dur ing the acute phase of infection.  Positive  results are indicative of active infection with SARS-CoV-2.  Clinical  correlation with patient history and other diagnostic information is  necessary to determine patient infection status.  Positive results do  not rule out bacterial infection or co-infection with other viruses. If result is PRESUMPTIVE POSTIVE SARS-CoV-2 nucleic acids MAY BE PRESENT.   A presumptive positive result was obtained on the submitted specimen  and confirmed on repeat testing.  While 2019 novel coronavirus  (SARS-CoV-2) nucleic acids may be present in the submitted sample  additional confirmatory testing may be necessary for epidemiological  and / or clinical management purposes  to differentiate between  SARS-CoV-2 and other Sarbecovirus currently known to infect humans.  If clinically indicated additional testing with an alternate test  methodology [phone]) is advised. The SARS-CoV-2 RNA is generally  detectable in upper and lower respiratory sp ecimens during the acute  phase of infection. The expected result is Negative. Fact Sheet for Patients:  StrictlyIdeas.no Fact Sheet for Healthcare Providers: BankingDealers.co.za This test is not yet approved or cleared by the [place] FDA and has been authorized for detection and/or diagnosis of SARS-CoV-2 by FDA under an Emergency Use Authorization (EUA).  This EUA will  remain in effect (meaning this test can be used) for the duration of the COVID-19 declaration under Section 564(b)(1) of the Act, 21 U.S.C. section 360bbb-3(b)(1), unless the authorization is terminated or revoked sooner. Performed at [facility], [place] [address] [place],  [address]       Studies: No results found.  Scheduled Meds: . amLODipine  10 mg Oral Daily  . benzonatate  100 mg Oral Q8H  . enoxaparin (LOVENOX) injection  40 mg Subcutaneous Q24H  . folic acid  1 mg Oral Daily  . LORazepam  0-4 mg Intravenous Q12H   Or  . LORazepam  0-4 mg Oral Q12H  . multivitamin with minerals  1  tablet Oral Daily  . nicotine  21 mg Transdermal Daily  . thiamine  100 mg Oral Daily  . umeclidinium bromide  1 puff Inhalation Daily  . vitamin B-12  1,000 mcg Oral Daily    Continuous Infusions: . sodium chloride 100 mL/hr at 02/21/19 1516     LOS: 2 days     Alma Friendly, MD [facility]  If 7PM-7AM, please contact night-coverage [email or web address] 02/21/2019, 5:43 PM

## 2019-02-21 NOTE — Evaluation (Signed)
Physical Therapy Evaluation Patient Details Name: Seth [name] MRN: [id number] DOB: July 11, 1956 Today's Date: 02/21/2019   History of Present Illness  63 y.o. male with medical history significant of hypertension, alcohol dependence with peripheral neuropathy, tobacco abuse, COPD came to emergency department with complaints of weakness and falls. 2 recent falls landing on lt knee. xray showed left proximal fibular fx; CT angiogram of the chest which ruled out PE  Clinical Impression   Pt admitted with above diagnosis. Pt currently with functional limitations due to the deficits listed below (see PT Problem List). Patient primarily limited by elevated HR with activity. Supine HR 115; sitting EOB 120 after walking around bed, 160s, after 4 minutes supine 133 bpm. Patient asymptomatic and per discussion with RN prior to session ? due to dehydration. Patient with multiple falls and now must wear knee immobilizer when up walking which will put him at further risk of falling. Patient reports his daughter is currently unemployed and can come stay with him to provide 24/7 supervision. Pt will benefit from skilled PT to increase their independence and safety with mobility to allow discharge to the venue listed below.       Follow Up Recommendations Home health PT;Supervision/Assistance - 24 hour (patient reports daughter can provide)    Equipment Recommendations  Rolling Ericksen with 5" wheels    Recommendations for Other Services OT consult     Precautions / Restrictions Precautions Precautions: Fall;Other (comment) Precaution Comments: monitor HR; very tachy Required Braces or Orthoses: Knee Immobilizer - Left Knee Immobilizer - Left: On when out of bed or walking(per hospitalist report from orthopedist) Restrictions Weight Bearing Restrictions: No Other Position/Activity Restrictions: WBAT      Mobility  Bed Mobility Overal bed mobility: Modified Independent             General bed  mobility comments: out of and back into bed  Transfers Overall transfer level: Needs assistance Equipment used: Rolling Steve (2 wheeled) Transfers: Sit to/from Stand Sit to Stand: Min guard         General transfer comment: vc for technique and safe use of RW  Ambulation/Gait Ambulation/Gait assistance: Min guard Gait Distance (Feet): 8 Feet Assistive device: Rolling [place] (2 wheeled) Gait Pattern/deviations: Step-to pattern     General Gait Details: limited distance due to elevated HR ?160s   Stairs            Wheelchair Mobility    Modified Rankin (Stroke Patients Only)       Balance Overall balance assessment: History of Falls                                           Pertinent Vitals/Pain Pain Assessment: No/denies pain    Home Living Family/patient expects to be discharged to:: Private residence Living Arrangements: Alone Available Help at Discharge: Family;Available 24 hours/day Type of Home: House Home Access: Stairs to enter   [employer] of Steps: 1(at the back ) Home Layout: One level Home Equipment: Cane - single point      Prior Function Level of Independence: Independent         Comments: 2 recent falls per pt     Hand Dominance   Dominant Hand: Left    Extremity/Trunk Assessment   Upper Extremity Assessment Upper Extremity Assessment: Overall WFL for tasks assessed    Lower Extremity Assessment Lower Extremity Assessment: RLE  deficits/detail;LLE deficits/detail RLE Deficits / Details: knee extension 4/5,  RLE Sensation: history of peripheral neuropathy LLE Deficits / Details: in knee immobilizer LLE Sensation: history of peripheral neuropathy       Communication   Communication: No difficulties  Cognition Arousal/Alertness: Awake/alert Behavior During Therapy: WFL for tasks assessed/performed Overall Cognitive Status: Within Functional Limits for tasks assessed                                  General Comments: a & o x 4      General Comments      Exercises     Assessment/Plan    PT Assessment Patient needs continued PT services  PT Problem List Decreased activity tolerance;Decreased balance;Decreased mobility;Decreased knowledge of use of DME;Decreased safety awareness;Cardiopulmonary status limiting activity;Impaired sensation       PT Treatment Interventions DME instruction;Gait training;Functional mobility training;Stair training;Therapeutic activities;Therapeutic exercise;Patient/family education    PT Goals (Current goals can be found in the Care Plan section)  Acute Rehab PT Goals Patient Stated Goal: go home with daughter's support PT Goal Formulation: With patient Time For Goal Achievement: 03/07/19 Potential to Achieve Goals: Good    Frequency Min 4X/week   Barriers to discharge        Co-evaluation               AM-PAC PT "6 Clicks" Mobility  Outcome Measure Help needed turning from your back to your side while in a flat bed without using bedrails?: None Help needed moving from lying on your back to sitting on the side of a flat bed without using bedrails?: None Help needed moving to and from a bed to a chair (including a wheelchair)?: A Little Help needed standing up from a chair using your arms (e.g., wheelchair or bedside chair)?: A Little Help needed to walk in hospital room?: A Little Help needed climbing 3-5 steps with a railing? : A Little 6 Click Score: 20    End of Session Equipment Utilized During Treatment: Gait belt;Left knee immobilizer Activity Tolerance: Treatment limited secondary to medical complications (Comment)(tachycardia) Patient left: in bed;with call bell/phone within reach;with bed alarm set Nurse Communication: Mobility status;Other (comment)(elevated HR 115 to 160s; 128 at end of session) PT Visit Diagnosis: Unsteadiness on feet (R26.81);Repeated falls (R29.6)    Time: 8372-9021 PT  Time Calculation (min) (ACUTE ONLY): 30 min   Charges:   PT Evaluation $PT Eval Moderate Complexity: 1 Mod            [employer], PT 02/21/2019, 5:05 PM

## 2019-02-21 NOTE — Progress Notes (Signed)
OT Cancellation Note  Patient Details Name: Seth [name] MRN: [id number] DOB: 03-Apr-1956   Cancelled Treatment:    Reason Eval/Treat Not Completed: Medical issues which prohibited therapy.  HR 120s to 160s. Will check another time or over the weekend.  Orlinda Slomski 02/21/2019, 9:55 AM  Lesle Chris, OTR/L Acute Rehabilitation Services [phone] [facility] pager [phone] office 02/21/2019

## 2019-02-21 NOTE — Progress Notes (Signed)
PT Cancellation Note  Patient Details Name: Seth [name] MRN: [id number] DOB: November 23, 1955   Cancelled Treatment:    Reason Eval/Treat Not Completed: Medical issues which prohibited therapy  HR 168 bpm. Spoke with Rn and he has been 120-160s this morning. Will defer at this time and monitor for appropriateness.   Jeanie Cooks Lessly Stigler, PT 02/21/2019, 9:35 AM

## 2019-02-22 ENCOUNTER — Inpatient Hospital Stay (HOSPITAL_COMMUNITY): Payer: Medicaid Other

## 2019-02-22 LAB — BASIC METABOLIC PANEL
Anion gap: 10 (ref 5–15)
BUN: 5 mg/dL — ABNORMAL LOW (ref 8–23)
CO2: 23 mmol/L (ref 22–32)
Calcium: 9.2 mg/dL (ref 8.9–10.3)
Chloride: 105 mmol/L (ref 98–111)
Creatinine, Ser: 0.66 mg/dL (ref 0.61–1.24)
GFR calc Af Amer: >60 mL/min (ref >60)
GFR calc non Af Amer: >60 mL/min (ref >60)
Glucose, Bld: 93 mg/dL (ref 70–99)
Potassium: 4.1 mmol/L (ref 3.5–5.1)
Sodium: 138 mmol/L (ref 135–145)

## 2019-02-22 LAB — CBC WITH DIFFERENTIAL/PLATELET
Abs Immature Granulocytes: 0.06 10*3/uL (ref 0.00–0.07)
Basophils Absolute: 0 10*3/uL (ref 0.0–0.1)
Basophils Relative: 0 %
Eosinophils Absolute: 0.1 10*3/uL (ref 0.0–0.5)
Eosinophils Relative: 2 %
HCT: 32 % — ABNORMAL LOW (ref 39.0–52.0)
Hemoglobin: 9.9 g/dL — ABNORMAL LOW (ref 13.0–17.0)
Immature Granulocytes: 1 %
Lymphocytes Relative: 11 %
Lymphs Abs: 0.9 10*3/uL (ref 0.7–4.0)
MCH: 33.9 pg (ref 26.0–34.0)
MCHC: 30.9 g/dL (ref 30.0–36.0)
MCV: 109.6 fL — ABNORMAL HIGH (ref 80.0–100.0)
Monocytes Absolute: 0.7 10*3/uL (ref 0.1–1.0)
Monocytes Relative: 9 %
Neutro Abs: 5.9 10*3/uL (ref 1.7–7.7)
Neutrophils Relative %: 77 %
Platelets: 166 10*3/uL (ref 150–400)
RBC: 2.92 MIL/uL — ABNORMAL LOW (ref 4.22–5.81)
RDW: 17.4 % — ABNORMAL HIGH (ref 11.5–15.5)
WBC: 7.7 10*3/uL (ref 4.0–10.5)
nRBC: 0 % (ref 0.0–0.2)

## 2019-02-22 MED ORDER — LEVALBUTEROL HCL 0.63 MG/3ML IN NEBU: 0.6300 mg | INHALATION_SOLUTION | Freq: Three times a day (TID) | RESPIRATORY_TRACT | Status: DC

## 2019-02-22 MED ORDER — LEVALBUTEROL HCL 0.63 MG/3ML IN NEBU: 0.6300 mg | INHALATION_SOLUTION | Freq: Three times a day (TID) | RESPIRATORY_TRACT | Status: DC | PRN

## 2019-02-22 MED ORDER — METOPROLOL TARTRATE 25 MG PO TABS
25.0000 mg | ORAL_TABLET | Freq: Two times a day (BID) | ORAL | Status: DC
  Administered 2019-02-22 – 2019-02-24 (×5): 25 mg via ORAL
  Filled 2019-02-22 (×5): qty 1

## 2019-02-22 MED ORDER — METOPROLOL TARTRATE 5 MG/5ML IV SOLN: 2.5000 mg | Freq: Three times a day (TID) | INTRAVENOUS | Status: DC | PRN

## 2019-02-22 NOTE — Progress Notes (Signed)
PROGRESS NOTE  [name]:[id number] DOB: [birth year] DOA: 02/19/2019 PCP: Mack Hook, MD  Seth/Recap of past 24 hours: Seth from Dr [name] is a 63 y.o. Seth [name] with medical history significant of hypertension, alcohol dependence, tobacco abuse, COPD came to emergency department with complaints of weakness and falls. Patient has been having trouble balancing since he does not feel when he walks and this has been going on for about 2 months.  He has been feeling significantly weak especially in his lower extremities for past 2 weeks.  He had a fall 2 weeks ago only because he lost his balance and landed on his left knee. Pt continued to get worse and he had another fall once again due to losing balance and once again he landed on his left knee.  He came to the emergency department today to seek further care.  Patient has also been having intermittent shortness of breath with exertion. Pt denies any chest pain, dizziness, headache, fever, chills, sweating, nausea, vomiting, diarrhea, any problem with urination or bowel movements, any recent travel or sick contact.  He further tells me that he had a stroke about a year ago which left him with right-sided weakness which has improved significantly over time and he seems to have equal strength in all 4 extremities on exam. In the ED, patient was significantly hypertensive with a systolic around 194 and diastolic around 174.  Had mild tachycardia. BMP showed mild hyperkalemia 5.2.  D-dimer was elevated which was followed with CT angiogram of the chest which ruled out PE.  He has a stable dilated ascending aorta.  Also 6 mm nodular opacity in the right lobe.  No consolidation or CHF findings.  First troponin negative.  BNP normal.  Left knee x-ray showed proximal fibular fracture.  Patient admitted for further management.     Today patient denies any new complaints.  Still noted to be significantly tachycardic especially during  ambulation.  Patient denies any chest pain, palpitations, shortness of breath, abdominal pain, fever/chills.   Assessment/Plan: Active Problems:   Chest pain   COPD exacerbation (HCC)   Generalized weakness   Hypertensive urgency   Hyperkalemia   Left fibular fracture   Chronic alcoholism (HCC)  Generalized weakness/frequent falls/loss of balance ?? 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  Dehydration, ?? Early withdrawal, ??Pain EKG with sinus tachycardia CTA chest negative for PE Echo pending IV fluids Start PO metoprolol scheduled, IV as needed CIWA protocol Telemetry, monitor closely  Left fibular non-displaced fracture Spoke to Dr. Maureen Ralphs from orthopedics on 02/21/19, recommended placing patient on knee immobilizer which can be taken off while in bed, weight bearing as tolerated Knee immobilizer in place PT/OT Follow-up with Ortho as an outpatient  Hypertensive urgency Improving Medication noncompliance, in addition to ?pain Continue amlodipine 10 mg and start PRN hydralazine Pain management  Hypomagnesemia Replace PRN  Chronic macrocytic anemia B12 as above, folate level pending Supplementation as above Daily CBC  COPD Stable Continue inhaler, DuoNeb  Chronic alcoholism CIWA protocol Monitor closely  Alcohol/tobacco abuse About half a pint of vodka daily Smokes half PPD CIWA protocol, advised to quit Nicotine patch          Malnutrition Type:      Malnutrition Characteristics:      Nutrition  Interventions:       Estimated body mass index is 20.02 kg/m as calculated from the following:   Height as of this encounter: 6\' 2"  (1.88 m).   Weight as of this encounter: 70.7 kg.     Code Status: Full   Family Communication: Spoke to sister on 02/20/19  Disposition Plan: Likely home Vs SNF once stable   Consultants:  Spoke to Dr. Maureen Ralphs orthopedics  Procedures:  None  Antimicrobials:  None  DVT prophylaxis: Lovenox   Objective: Vitals:   02/22/19 0637 02/22/19 0838 02/22/19 1337 02/22/19 1352  BP: (!) 146/90  132/84 128/84  Pulse: (!) 114  (!) 126 (!) 125  Resp: 16  18 20   Temp: 98.7 F (37.1 C)  98.9 F (37.2 C) 98.6 F (37 C)  TempSrc: Oral  Oral Oral  SpO2: 100% 99%  100%  Weight:      Height:        Intake/Output Summary (Last 24 hours) at 02/22/2019 1649 Last data filed at 02/22/2019 1400 Gross per 24 hour  Intake 1534.08 ml  Output 1800 ml  Net -265.92 ml   Filed Weights   02/19/19 1042 02/20/19 0500 02/22/19 0500  Weight: 68 kg 68 kg 70.7 kg    Exam:  General: NAD   Cardiovascular: S1, S2 present  Respiratory:  Diminished breath sounds bilaterally  Abdomen: Soft, nontender, nondistended, bowel sounds present  Musculoskeletal: No bilateral pedal edema noted  Skin: Normal  Psychiatry: Normal mood   Data Reviewed: CBC: Recent Labs  Lab 02/19/19 1301 02/19/19 1858 02/20/19 0612 02/21/19 0554 02/22/19 0531  WBC 7.3 5.7 6.4 6.1 7.7  NEUTROABS 5.1  --   --  4.6 5.9  HGB 10.2* 9.8* 10.6* 9.4* 9.9*  HCT 30.8* 30.0*  28.5* 32.2* 29.7* 32.0*  MCV 105.5* 105.6* 104.9* 107.6* 109.6*  PLT 166 152 170 158 932   Basic Metabolic Panel: Recent Labs  Lab 02/19/19 1301 02/19/19 1858 02/20/19 0612 02/21/19 0554 02/22/19 0531  NA 137  --  137 136 138  K 5.2*  --  4.0 3.6 4.1  CL 94*  --  99 101 105  CO2 22  --  25 24 23   GLUCOSE 97  --  105* 94 93  BUN 12  --  7* <5* 5*  CREATININE 1.12 0.90 0.86 0.69 0.66  CALCIUM 9.3  --  9.0 9.1 9.2  MG  --  1.6* 1.4* 1.9  --    GFR: Estimated Creatinine Clearance: 97 mL/min (by C-G formula based on SCr of 0.66 mg/dL). Liver Function Tests: Recent Labs  Lab 02/19/19 1301 02/20/19 0612  AST  72* 101*  ALT 21 23  ALKPHOS 106 104  BILITOT 2.1* 1.5*  PROT 7.4 6.9  ALBUMIN 4.1 3.7   No results for input(s): LIPASE, AMYLASE in the last 168 hours. No results for input(s): AMMONIA in the last 168 hours. Coagulation Profile: No results for input(s): INR, PROTIME in the last 168 hours. Cardiac Enzymes: No results for input(s): CKTOTAL, CKMB, CKMBINDEX, TROPONINI in the last 168 hours. BNP (last 3 results) No results for input(s): PROBNP in the last 8760 hours. HbA1C: No results for input(s): HGBA1C in the last 72 hours. CBG: No results for input(s): GLUCAP in the last 168 hours. Lipid Profile: No results for input(s): CHOL, HDL, LDLCALC, TRIG, CHOLHDL, LDLDIRECT in the last 72 hours. Thyroid Function Tests: Recent Labs    02/19/19 1858  TSH 2.197   Anemia Panel: Recent Labs  02/19/19 1858  VITAMINB12 389   Urine analysis:    Component Value Date/Time   COLORURINE YELLOW 08/23/2017 1530   APPEARANCEUR CLEAR 08/23/2017 1530   LABSPEC 1.004 (L) 08/23/2017 1530   PHURINE 6.0 08/23/2017 1530   GLUCOSEU NEGATIVE 08/23/2017 1530   HGBUR SMALL (A) 08/23/2017 1530   BILIRUBINUR NEGATIVE 08/23/2017 1530   KETONESUR NEGATIVE 08/23/2017 1530   PROTEINUR NEGATIVE 08/23/2017 1530   NITRITE NEGATIVE 08/23/2017 1530   LEUKOCYTESUR NEGATIVE 08/23/2017 1530   Sepsis Labs: @LABRCNTIP (procalcitonin:4,lacticidven:4)  ) Recent Results (from the past 240 hour(s))  SARS Coronavirus 2 (CEPHEID - Performed in [place] hospital lab), Hosp Order     Status: None   Collection Time: 02/19/19  3:17 PM   Specimen: Nasopharyngeal Swab  Result Value Ref Range Status   SARS Coronavirus 2 NEGATIVE NEGATIVE Final    Comment: (NOTE) If result is NEGATIVE SARS-CoV-2 target nucleic acids are NOT DETECTED. The SARS-CoV-2 RNA is generally detectable in upper and lower  respiratory specimens during the acute phase of infection. The lowest  concentration of SARS-CoV-2 viral copies this  assay can detect is 250  copies / mL. A negative result does not preclude SARS-CoV-2 infection  and should not be used as the sole basis for treatment or other  patient management decisions.  A negative result may occur with  improper specimen collection / handling, submission of specimen other  than nasopharyngeal swab, presence of viral mutation(s) within the  areas targeted by this assay, and inadequate number of viral copies  (<250 copies / mL). A negative result must be combined with clinical  observations, patient history, and epidemiological information. If result is POSITIVE SARS-CoV-2 target nucleic acids are DETECTED. The SARS-CoV-2 RNA is generally detectable in upper and lower  respiratory specimens dur ing the acute phase of infection.  Positive  results are indicative of active infection with SARS-CoV-2.  Clinical  correlation with patient history and other diagnostic information is  necessary to determine patient infection status.  Positive results do  not rule out bacterial infection or co-infection with other viruses. If result is PRESUMPTIVE POSTIVE SARS-CoV-2 nucleic acids MAY BE PRESENT.   A presumptive positive result was obtained on the submitted specimen  and confirmed on repeat testing.  While 2019 novel coronavirus  (SARS-CoV-2) nucleic acids may be present in the submitted sample  additional confirmatory testing may be necessary for epidemiological  and / or clinical management purposes  to differentiate between  SARS-CoV-2 and other Sarbecovirus currently known to infect humans.  If clinically indicated additional testing with an alternate test  methodology [phone]) is advised. The SARS-CoV-2 RNA is generally  detectable in upper and lower respiratory sp ecimens during the acute  phase of infection. The expected result is Negative. Fact Sheet for Patients:  StrictlyIdeas.no Fact Sheet for Healthcare Providers:  BankingDealers.co.za This test is not yet approved or cleared by the [place] FDA and has been authorized for detection and/or diagnosis of SARS-CoV-2 by FDA under an Emergency Use Authorization (EUA).  This EUA will remain in effect (meaning this test can be used) for the duration of the COVID-19 declaration under Section 564(b)(1) of the Act, 21 U.S.C. section 360bbb-3(b)(1), unless the authorization is terminated or revoked sooner. Performed at [facility], [place] [address], [place], [place] [address]       Studies: No results found.  Scheduled Meds: . amLODipine  10 mg Oral Daily  . benzonatate  100 mg Oral Q8H  . enoxaparin (  LOVENOX) injection  40 mg Subcutaneous Q24H  . folic acid  1 mg Oral Daily  . LORazepam  0-4 mg Intravenous Q12H   Or  . LORazepam  0-4 mg Oral Q12H  . metoprolol tartrate  25 mg Oral BID  . multivitamin with minerals  1 tablet Oral Daily  . nicotine  21 mg Transdermal Daily  . thiamine  100 mg Oral Daily  . umeclidinium bromide  1 puff Inhalation Daily  . vitamin B-12  1,000 mcg Oral Daily    Continuous Infusions: . sodium chloride 100 mL/hr at 02/22/19 0226     LOS: 3 days     Alma Friendly, MD [facility]  If 7PM-7AM, please contact night-coverage [email or web address] 02/22/2019, 4:49 PM

## 2019-02-22 NOTE — Evaluation (Signed)
Occupational Therapy Evaluation Patient Details Name: Seth [name] MRN: [id number] DOB: Jan 07, 1956 Today's Date: 02/22/2019    History of Present Illness 63 y.o. male with medical history significant of hypertension, alcohol dependence with peripheral neuropathy, tobacco abuse, COPD came to emergency department with complaints of weakness and falls. 2 recent falls landing on lt knee. xray showed left proximal fibular fx; CT angiogram of the chest which ruled out PE   Clinical Impression   Pt was admitted for the above.  Pt was I/mod I at baseline prior to admission for adls. Performed today and transferred to chair.  Pt needed mod A to power up from elevated bed today; yesterday, he needed only min guard with PT.  Performed twice today. He would benefit from SNF but may be limited with this option due to medicaid potential.  If he cannot d/c here, recommend 24/7 assistance at home. Will follow in acute setting with min guard level goals     Follow Up Recommendations  SNF(vs 24/7 assist)    Equipment Recommendations  3 in 1 bedside commode    Recommendations for Other Services       Precautions / Restrictions Precautions Precautions: Fall;Other (comment) Precaution Comments: monitor HR; very tachy Required Braces or Orthoses: Knee Immobilizer - Left Knee Immobilizer - Left: On when out of bed or walking Restrictions Other Position/Activity Restrictions: (wbat with ki)      Mobility Bed Mobility Overal bed mobility: Modified Independent             General bed mobility comments: extra time, HOB raised  Transfers   Equipment used: Rolling [place] (2 wheeled)   Sit to Stand: Mod assist;From elevated surface(two times during ADL and transfer)         General transfer comment: pt needed mod A to power up to standing    Balance Overall balance assessment: History of Falls                                         ADL either performed or assessed  with clinical judgement   ADL Overall ADL's : Needs assistance/impaired Eating/Feeding: Independent   Grooming: Set up   Upper Body Bathing: Set up   Lower Body Bathing: Moderate assistance;Sit to/from stand   Upper Body Dressing : Minimal assistance(for lines)   Lower Body Dressing: Moderate assistance;Maximal assistance;Sit to/from stand   Toilet Transfer: Moderate assistance;Stand-pivot;RW(to chair for sit to stand; min for steps)   Toileting- Clothing Manipulation and Hygiene: Moderate assistance;Sit to/from stand(for sit to stand)         General ADL Comments: performed adl and transferred OOB to chair     Vision         Perception     Praxis      Pertinent Vitals/Pain Pain Assessment: No/denies pain     Hand Dominance Left   Extremity/Trunk Assessment Upper Extremity Assessment Upper Extremity Assessment: Overall WFL for tasks assessed           Communication Communication Communication: No difficulties   Cognition Arousal/Alertness: Awake/alert Behavior During Therapy: WFL for tasks assessed/performed                                   General Comments: pt doesn't seem aware of how much assistance he needed   General Comments  beginning HR was 112.  Monitor did not pick up HR during session    Exercises     Shoulder Instructions      Home Living Family/patient expects to be discharged to:: Private residence Living Arrangements: Alone Available Help at Discharge: Family;Available 24 hours/day               Bathroom Shower/Tub: Teacher, early years/pre: Standard     Home Equipment: Cane - single point   Additional Comments: daughter not working  can stay      Prior Functioning/Environment Level of Independence: Independent        Comments: 2 recent falls per pt        OT Problem List: Decreased strength;Decreased activity tolerance;Impaired balance (sitting and/or standing);Pain;Decreased  knowledge of use of DME or AE;Cardiopulmonary status limiting activity      OT Treatment/Interventions: Self-care/ADL training;Energy conservation;DME and/or AE instruction;Therapeutic activities;Patient/family education;Balance training    OT Goals(Current goals can be found in the care plan section) Acute Rehab OT Goals Patient Stated Goal: go home with daughter's support OT Goal Formulation: With patient Time For Goal Achievement: 03/08/19 Potential to Achieve Goals: Good ADL Goals Pt Will Perform Lower Body Bathing: with min guard assist;with adaptive equipment;sit to/from stand Pt Will Perform Lower Body Dressing: with min guard assist;sit to/from stand;with adaptive equipment Pt Will Transfer to Toilet: with min guard assist;ambulating;stand pivot transfer;bedside commode Pt Will Perform Toileting - Clothing Manipulation and hygiene: with min guard assist;sit to/from stand Additional ADL Goal #1: pt will verbalize need for KI for OOB and off as desired in bed  OT Frequency: Min 2X/week   Barriers to D/C:            Co-evaluation              AM-PAC OT "6 Clicks" Daily Activity     Outcome Measure Help from another person eating meals?: None Help from another person taking care of personal grooming?: A Little Help from another person toileting, which includes using toliet, bedpan, or urinal?: A Lot Help from another person bathing (including washing, rinsing, drying)?: A Lot Help from another person to put on and taking off regular upper body clothing?: A Little Help from another person to put on and taking off regular lower body clothing?: A Lot 6 Click Score: 16   End of Session Nurse Communication: Mobility status(+2)  Activity Tolerance: Patient tolerated treatment well Patient left: in chair;with call bell/phone within reach;with chair alarm set  OT Visit Diagnosis: Unsteadiness on feet (R26.81);Muscle weakness (generalized) (M62.81);Pain Pain - Right/Left:  Left Pain - part of body: Leg                Time: 0923-3007 OT Time Calculation (min): 28 min Charges:  OT General Charges $OT Visit: 1 Visit OT Evaluation $OT Eval Moderate Complexity: 1 Mod OT Treatments $Self Care/Home Management : 8-22 mins  Lesle Chris, OTR/L Acute Rehabilitation Services [phone] [facility] pager [phone] office 02/22/2019  [place] 02/22/2019, 12:12 PM

## 2019-02-22 NOTE — Progress Notes (Signed)
HR elevated at 2:30 pm. Will attempt when HR decreases.

## 2019-02-22 NOTE — Progress Notes (Signed)
Physical Therapy Treatment Patient Details Name: Seth [name] MRN: [id number] DOB: Mar 21, 1956 Today's Date: 02/22/2019    History of Present Illness 63 y.o. male with medical history significant of hypertension, alcohol dependence with peripheral neuropathy, tobacco abuse, COPD came to emergency department with complaints of weakness and falls. 2 recent falls landing on lt knee. xray showed left proximal fibular fx; CT angiogram of the chest which ruled out PE    PT Comments    Pt was min assist overall for mobility and gait with RW.  We followed with chair today, but it was not needed.  He reports he does not have a RW and fell using his cane 3 times in the past month.  The cane may not be providing enough stability for him.     Follow Up Recommendations  Home health PT;Supervision/Assistance - 24 hour     Equipment Recommendations  Rolling Pardoe with 5" wheels    Recommendations for Other Services OT consult     Precautions / Restrictions Precautions Precautions: Fall;Other (comment) Precaution Comments: monitor HR; very tachy; 3 falls in the past month Required Braces or Orthoses: Knee Immobilizer - Left Knee Immobilizer - Left: On when out of bed or walking Restrictions Other Position/Activity Restrictions: WBAT    Mobility  Bed Mobility Overal bed mobility: Modified Independent             General bed mobility comments: Pt was OOB in the recliner chair.   Transfers Overall transfer level: Needs assistance Equipment used: Rolling [place] (2 wheeled) Transfers: Sit to/from Stand Sit to Stand: Min assist         General transfer comment: Pt stood from chair x 2 with cues for safe hand placement and assist to power up and stabilize RW.    Ambulation/Gait Ambulation/Gait assistance: Min guard;+2 safety/equipment Gait Distance (Feet): 100 Feet Assistive device: Rolling [place] (2 wheeled) Gait Pattern/deviations: Step-through pattern;Trunk flexed Gait  velocity: decreased Gait velocity interpretation: 1.31 - 2.62 ft/sec, indicative of limited community ambulator General Gait Details: cues for upright posture and for safe RW use (stay inside of handles when walking and turning).  Pt reports he has had one fall inside and one outside.  He uses a cane normally, does not have a RW.  He did not get any therapy after his stroke, he has had 3 falls in the past month.           Balance Overall balance assessment: History of Falls                                          Cognition Arousal/Alertness: Awake/alert Behavior During Therapy: WFL for tasks assessed/performed Overall Cognitive Status: Within Functional Limits for tasks assessed                                 General Comments: not specifically tested             Pertinent Vitals/Pain Pain Assessment: No/denies pain    Home Living Family/patient expects to be discharged to:: Private residence Living Arrangements: Alone Available Help at Discharge: Family;Available 24 hours/day         Home Equipment: Kasandra Knudsen - single point Additional Comments: daughter not working  can stay    Prior Function Level of Independence: Independent      Comments: 2  recent falls per pt   PT Goals (current goals can now be found in the care plan section) Acute Rehab PT Goals Patient Stated Goal: go home with daughter's support Progress towards PT goals: Progressing toward goals    Frequency    Min 4X/week      PT Plan Current plan remains appropriate       AM-PAC PT "6 Clicks" Mobility   Outcome Measure  Help needed turning from your back to your side while in a flat bed without using bedrails?: None Help needed moving from lying on your back to sitting on the side of a flat bed without using bedrails?: None Help needed moving to and from a bed to a chair (including a wheelchair)?: A Little Help needed standing up from a chair using your arms  (e.g., wheelchair or bedside chair)?: A Little Help needed to walk in hospital room?: A Little Help needed climbing 3-5 steps with a railing? : A Lot 6 Click Score: 19    End of Session Equipment Utilized During Treatment: Gait belt;Left knee immobilizer Activity Tolerance: Patient tolerated treatment well Patient left: in chair;with call bell/phone within reach;with chair alarm set Nurse Communication: Other (comment)(RN pushing chair, but not needed. ) PT Visit Diagnosis: Unsteadiness on feet (R26.81);Repeated falls (R29.6)     Time: 4765-4650 PT Time Calculation (min) (ACUTE ONLY): 21 min  Charges:  $Gait Training: 8-22 mins                    Mashelle Busick B. Emoni Yang, PT, DPT  Acute Rehabilitation [phone] pager [phone] office  @ [facility]: [phone]   02/22/2019, 1:42 PM

## 2019-02-23 ENCOUNTER — Inpatient Hospital Stay (HOSPITAL_COMMUNITY): Payer: Medicaid Other

## 2019-02-23 DIAGNOSIS — R079 Chest pain, unspecified: Secondary | ICD-10-CM

## 2019-02-23 LAB — CBC WITH DIFFERENTIAL/PLATELET
Abs Immature Granulocytes: 0.04 10*3/uL (ref 0.00–0.07)
Basophils Absolute: 0 10*3/uL (ref 0.0–0.1)
Basophils Relative: 0 %
Eosinophils Absolute: 0.1 10*3/uL (ref 0.0–0.5)
Eosinophils Relative: 2 %
HCT: 29 % — ABNORMAL LOW (ref 39.0–52.0)
Hemoglobin: 9 g/dL — ABNORMAL LOW (ref 13.0–17.0)
Immature Granulocytes: 1 %
Lymphocytes Relative: 15 %
Lymphs Abs: 1 10*3/uL (ref 0.7–4.0)
MCH: 34.1 pg — ABNORMAL HIGH (ref 26.0–34.0)
MCHC: 31 g/dL (ref 30.0–36.0)
MCV: 109.8 fL — ABNORMAL HIGH (ref 80.0–100.0)
Monocytes Absolute: 0.8 10*3/uL (ref 0.1–1.0)
Monocytes Relative: 12 %
Neutro Abs: 4.6 10*3/uL (ref 1.7–7.7)
Neutrophils Relative %: 70 %
Platelets: 179 10*3/uL (ref 150–400)
RBC: 2.64 MIL/uL — ABNORMAL LOW (ref 4.22–5.81)
RDW: 17 % — ABNORMAL HIGH (ref 11.5–15.5)
WBC: 6.5 10*3/uL (ref 4.0–10.5)
nRBC: 0 % (ref 0.0–0.2)

## 2019-02-23 LAB — ECHOCARDIOGRAM COMPLETE
Height: 74 in
Weight: 2321 oz

## 2019-02-23 NOTE — Progress Notes (Signed)
  Echocardiogram 2D Echocardiogram has been performed.  Seth [name] 02/23/2019, 9:42 AM

## 2019-02-23 NOTE — Progress Notes (Signed)
PROGRESS NOTE  Seth [name] QDI:[id number] DOB: [birth year] DOA: 02/19/2019 PCP: Mack Hook, MD  HPI/Recap of past 24 hours: HPI from Dr Seth [name] is a 63 y.o. male with medical history significant of hypertension, alcohol dependence, tobacco abuse, COPD came to emergency department with complaints of weakness and falls. Patient has been having trouble balancing since he does not feel when he walks and this has been going on for about 2 months.  He has been feeling significantly weak especially in his lower extremities for past 2 weeks.  He had a fall 2 weeks ago only because he lost his balance and landed on his left knee. Pt continued to get worse and he had another fall once again due to losing balance and once again he landed on his left knee.  He came to the emergency department today to seek further care.  Patient has also been having intermittent shortness of breath with exertion. Pt denies any chest pain, dizziness, headache, fever, chills, sweating, nausea, vomiting, diarrhea, any problem with urination or bowel movements, any recent travel or sick contact.  He further tells me that he had a stroke about a year ago which left him with right-sided weakness which has improved significantly over time and he seems to have equal strength in all 4 extremities on exam. In the ED, patient was significantly hypertensive with a systolic around 407 and diastolic around 680.  Had mild tachycardia. BMP showed mild hyperkalemia 5.2.  D-dimer was elevated which was followed with CT angiogram of the chest which ruled out PE.  He has a stable dilated ascending aorta.  Also 6 mm nodular opacity in the right lobe.  No consolidation or CHF findings.  First troponin negative.  BNP normal.  Left knee x-ray showed proximal fibular fracture.  Patient admitted for further management.    Today patient denied any new complaints.  Still noted to be tachycardic especially with ambulation.  Still with  occasional pain in the left lower extremity when weightbearing.   Assessment/Plan: Active Problems:   Chest pain   COPD exacerbation (HCC)   Generalized weakness   Hypertensive urgency   Hyperkalemia   Left fibular fracture   Chronic alcoholism (HCC)  Generalized weakness/frequent falls/loss of balance ?? 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  Dehydration, ?? Early withdrawal, ??Pain EKG with sinus tachycardia CTA chest negative for PE Echo with normal LVEF, no other obvious significant abnormalities Stop IV fluids Start PO metoprolol scheduled, IV as needed CIWA protocol Telemetry, monitor closely  Left fibular non-displaced fracture Spoke to Dr. Maureen Ralphs from orthopedics on 02/21/19, recommended placing patient on knee immobilizer which can be taken off while in bed, weight bearing as tolerated Knee immobilizer in place PT/OT Follow-up with Ortho as an outpatient  Hypertensive urgency Improved Medication noncompliance, in addition to ?pain Continue amlodipine 10 mg and start PRN hydralazine Pain management  Hypomagnesemia Replace PRN  Chronic macrocytic anemia B12 as above, folate level pending Supplementation as above Daily CBC  COPD Stable Continue inhaler, DuoNeb  Chronic alcoholism CIWA protocol Monitor closely  Alcohol/tobacco abuse About half a pint of vodka daily Smokes half PPD CIWA protocol, advised to quit Nicotine patch          Malnutrition Type:      Malnutrition Characteristics:  Nutrition Interventions:       Estimated body mass index is 18.62 kg/m as calculated from the following:   Height as of this encounter: 6\' 2"  (1.88 m).   Weight as of this encounter: 65.8 kg.     Code  Status: Full  Family Communication: Spoke to sister on 02/20/19  Disposition Plan: Likely home Vs SNF once stable   Consultants:  Spoke to Dr. Maureen Ralphs orthopedics  Procedures:  None  Antimicrobials:  None  DVT prophylaxis: Lovenox   Objective: Vitals:   02/22/19 2208 02/23/19 0324 02/23/19 0424 02/23/19 1126  BP: (!) 146/92  134/90   Pulse: 100  95   Resp: 18  18   Temp: 98.7 F (37.1 C)  98.3 F (36.8 C)   TempSrc: Oral  Oral   SpO2: 100%  100% 100%  Weight:  65.8 kg    Height:        Intake/Output Summary (Last 24 hours) at 02/23/2019 1317 Last data filed at 02/23/2019 0320 Gross per 24 hour  Intake -  Output 1975 ml  Net -1975 ml   Filed Weights   02/20/19 0500 02/22/19 0500 02/23/19 0324  Weight: 68 kg 70.7 kg 65.8 kg    Exam:  General: NAD   Cardiovascular: S1, S2 present  Respiratory:  Diminished breath sounds bilaterally  Abdomen: Soft, nontender, nondistended, bowel sounds present  Musculoskeletal: No bilateral pedal edema noted  Skin: Normal  Psychiatry: Normal mood   Data Reviewed: CBC: Recent Labs  Lab 02/19/19 1301 02/19/19 1858 02/20/19 0612 02/21/19 0554 02/22/19 0531 02/23/19 0550  WBC 7.3 5.7 6.4 6.1 7.7 6.5  NEUTROABS 5.1  --   --  4.6 5.9 4.6  HGB 10.2* 9.8* 10.6* 9.4* 9.9* 9.0*  HCT 30.8* 30.0*  28.5* 32.2* 29.7* 32.0* 29.0*  MCV 105.5* 105.6* 104.9* 107.6* 109.6* 109.8*  PLT 166 152 170 158 166 607   Basic Metabolic Panel: Recent Labs  Lab 02/19/19 1301 02/19/19 1858 02/20/19 0612 02/21/19 0554 02/22/19 0531  NA 137  --  137 136 138  K 5.2*  --  4.0 3.6 4.1  CL 94*  --  99 101 105  CO2 22  --  25 24 23   GLUCOSE 97  --  105* 94 93  BUN 12  --  7* <5* 5*  CREATININE 1.12 0.90 0.86 0.69 0.66  CALCIUM 9.3  --  9.0 9.1 9.2  MG  --  1.6* 1.4* 1.9  --    GFR: Estimated Creatinine Clearance: 90.2 mL/min (by C-G formula based on SCr of 0.66 mg/dL). Liver Function Tests: Recent Labs  Lab 02/19/19 1301  02/20/19 0612  AST 72* 101*  ALT 21 23  ALKPHOS 106 104  BILITOT 2.1* 1.5*  PROT 7.4 6.9  ALBUMIN 4.1 3.7   No results for input(s): LIPASE, AMYLASE in the last 168 hours. No results for input(s): AMMONIA in the last 168 hours. Coagulation Profile: No results for input(s): INR, PROTIME in the last 168 hours. Cardiac Enzymes: No results for input(s): CKTOTAL, CKMB, CKMBINDEX, TROPONINI in the last 168 hours. BNP (last 3 results) No results for input(s): PROBNP in the last 8760 hours. HbA1C: No results for input(s): HGBA1C in the last 72 hours. CBG: No results for input(s): GLUCAP in the last 168 hours. Lipid Profile: No results for input(s): CHOL, HDL, LDLCALC, TRIG, CHOLHDL, LDLDIRECT in the last 72 hours. Thyroid Function Tests: No results for input(s): TSH, T4TOTAL, FREET4, T3FREE, THYROIDAB in the last 72 hours. Anemia  Panel: No results for input(s): VITAMINB12, FOLATE, FERRITIN, TIBC, IRON, RETICCTPCT in the last 72 hours. Urine analysis:    Component Value Date/Time   COLORURINE YELLOW 08/23/2017 1530   APPEARANCEUR CLEAR 08/23/2017 1530   LABSPEC 1.004 (L) 08/23/2017 1530   PHURINE 6.0 08/23/2017 1530   GLUCOSEU NEGATIVE 08/23/2017 1530   HGBUR SMALL (A) 08/23/2017 1530   BILIRUBINUR NEGATIVE 08/23/2017 1530   KETONESUR NEGATIVE 08/23/2017 1530   PROTEINUR NEGATIVE 08/23/2017 1530   NITRITE NEGATIVE 08/23/2017 1530   LEUKOCYTESUR NEGATIVE 08/23/2017 1530   Sepsis Labs: @LABRCNTIP (procalcitonin:4,lacticidven:4)  ) Recent Results (from the past 240 hour(s))  SARS Coronavirus 2 (CEPHEID - Performed in [facility] hospital lab), Hosp Order     Status: None   Collection Time: 02/19/19  3:17 PM   Specimen: Nasopharyngeal Swab  Result Value Ref Range Status   SARS Coronavirus 2 NEGATIVE NEGATIVE Final    Comment: (NOTE) If result is NEGATIVE SARS-CoV-2 target nucleic acids are NOT DETECTED. The SARS-CoV-2 RNA is generally detectable in upper and lower   respiratory specimens during the acute phase of infection. The lowest  concentration of SARS-CoV-2 viral copies this assay can detect is 250  copies / mL. A negative result does not preclude SARS-CoV-2 infection  and should not be used as the sole basis for treatment or other  patient management decisions.  A negative result may occur with  improper specimen collection / handling, submission of specimen other  than nasopharyngeal swab, presence of viral mutation(s) within the  areas targeted by this assay, and inadequate number of viral copies  (<250 copies / mL). A negative result must be combined with clinical  observations, patient history, and epidemiological information. If result is POSITIVE SARS-CoV-2 target nucleic acids are DETECTED. The SARS-CoV-2 RNA is generally detectable in upper and lower  respiratory specimens dur ing the acute phase of infection.  Positive  results are indicative of active infection with SARS-CoV-2.  Clinical  correlation with patient history and other diagnostic information is  necessary to determine patient infection status.  Positive results do  not rule out bacterial infection or co-infection with other viruses. If result is PRESUMPTIVE POSTIVE SARS-CoV-2 nucleic acids MAY BE PRESENT.   A presumptive positive result was obtained on the submitted specimen  and confirmed on repeat testing.  While 2019 novel coronavirus  (SARS-CoV-2) nucleic acids may be present in the submitted sample  additional confirmatory testing may be necessary for epidemiological  and / or clinical management purposes  to differentiate between  SARS-CoV-2 and other Sarbecovirus currently known to infect humans.  If clinically indicated additional testing with an alternate test  methodology [phone]) is advised. The SARS-CoV-2 RNA is generally  detectable in upper and lower respiratory sp ecimens during the acute  phase of infection. The expected result is Negative. Fact  Sheet for Patients:  StrictlyIdeas.no Fact Sheet for Healthcare Providers: BankingDealers.co.za This test is not yet approved or cleared by the [place] FDA and has been authorized for detection and/or diagnosis of SARS-CoV-2 by FDA under an Emergency Use Authorization (EUA).  This EUA will remain in effect (meaning this test can be used) for the duration of the COVID-19 declaration under Section 564(b)(1) of the Act, 21 U.S.C. section 360bbb-3(b)(1), unless the authorization is terminated or revoked sooner. Performed at [facility], [place] [address], [place], [place] [address]       Studies: No results found.  Scheduled Meds: . amLODipine  10 mg Oral Daily  .  benzonatate  100 mg Oral Q8H  . enoxaparin (LOVENOX) injection  40 mg Subcutaneous Q24H  . folic acid  1 mg Oral Daily  . LORazepam  0-4 mg Intravenous Q12H   Or  . LORazepam  0-4 mg Oral Q12H  . metoprolol tartrate  25 mg Oral BID  . multivitamin with minerals  1 tablet Oral Daily  . nicotine  21 mg Transdermal Daily  . thiamine  100 mg Oral Daily  . umeclidinium bromide  1 puff Inhalation Daily  . vitamin B-12  1,000 mcg Oral Daily    Continuous Infusions:    LOS: 4 days     Alma Friendly, MD [facility]  If 7PM-7AM, please contact night-coverage [email or web address] 02/23/2019, 1:17 PM

## 2019-02-24 LAB — FOLATE RBC
Folate, Hemolysate: 255 ng/mL
Folate, RBC: 904 ng/mL (ref >498)
Hematocrit: 28.2 % — ABNORMAL LOW (ref 37.5–51.0)

## 2019-02-24 MED ORDER — THIAMINE HCL 100 MG PO TABS: 100.0000 mg | ORAL_TABLET | Freq: Every day | ORAL | 0 refills | Status: AC

## 2019-02-24 MED ORDER — NICOTINE 21 MG/24HR TD PT24: 21.0000 mg | MEDICATED_PATCH | Freq: Every day | TRANSDERMAL | 0 refills | Status: DC

## 2019-02-24 MED ORDER — METOPROLOL TARTRATE 25 MG PO TABS: 25.0000 mg | ORAL_TABLET | Freq: Two times a day (BID) | ORAL | 0 refills | Status: DC

## 2019-02-24 NOTE — Discharge Summary (Signed)
Discharge Summary  [name]:[id number] DOB: Dec 28, 1955  PCP: Mack Hook, MD  Admit date: 02/19/2019 Discharge date: 02/24/2019  Time spent: 35 mins   Recommendations for Outpatient Follow-up:  1. PCP 2. Orthopedics  Discharge Diagnoses:  Active Hospital Problems   Diagnosis Date Noted   Generalized weakness 02/19/2019   Hypertensive urgency 02/19/2019   Hyperkalemia 02/19/2019   Left fibular fracture 02/19/2019   Chronic alcoholism ([place]) 02/19/2019   Chest pain 08/23/2017   COPD exacerbation ([place]) 08/23/2017    Resolved Hospital Problems  No resolved problems to display.    Discharge Condition: Stable  Diet recommendation: Heart healthy  Vitals:   02/23/19 2105 02/24/19 0440  BP: (!) 147/85 126/88  Pulse: (!) 108 96  Resp: 19 18  Temp: 98.4 F (36.9 C) 98.3 F (36.8 C)  SpO2: 100% 100%    History of present illness:  Seth [name] a 63 y.o.malewith medical history significant ofhypertension, alcohol dependence,tobacco abuse, COPD came to emergency department with complaints of weakness and falls. Patient has been having trouble balancing since he does not feel when he walks and this has been going on for about 2 months. He has been feeling significantly weak especially in his lower extremities for past 2 weeks. He had a fall 2 weeks ago only because he lost his balance and landed on his left knee. Pt continued to get worse and he had another fall once again due to losing balance and once again he landed on his left knee. He came to the emergency department today to seek further care. Patient has also been having intermittent shortness of breath with exertion. Pt denies any chest pain, dizziness, headache, fever, chills, sweating, nausea, vomiting, diarrhea, any problem with urination or bowel movements, any recent travel or sick contact. 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 In the ED, patient was significantly hypertensive with a systolic around 350 and diastolic around 093. Had mild tachycardia. BMP showed mild hyperkalemia 5.2. D-dimer was elevated which was followed with CT angiogram of the chest which ruled out PE. He has a stable dilated ascending aorta. Also 6 mm nodular opacity in the right lobe. No consolidation or CHF findings. First troponin negative. BNP normal. Left knee x-ray showed proximal fibular fracture.  Patient admitted for further management.    Today, patient reports feeling much better.  Eager to be discharged.  Patient is without insurance, so SNF placement would be difficult (although patient refusing).  Patient will be discharged with home health PT. patient advised to avoid alcohol and quit smoking cigarettes.  Advised to follow-up with his PCP in 1 week as well as orthopedics in 2 weeks.   Hospital Course:  Active Problems:   Chest pain   COPD exacerbation ([facility])   Generalized weakness   Hypertensive urgency   Hyperkalemia   Left fibular fracture   Chronic alcoholism ([facility])  Generalized weakness/frequent falls/loss of balance ?? 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  Dehydration, ?? Early withdrawal, ??Pain EKG with sinus tachycardia CTA chest negative for PE Echo with normal LVEF, no other obvious significant abnormalities Continue PO  metoprolol scheduled Follow-up with PCP  Left fibular non-displaced fracture Spoke to Dr. Maureen Ralphs from orthopedics on 02/21/19, recommended placing patient on knee immobilizer which can be taken off while in bed, weight bearing as  tolerated Knee immobilizer in place HHPT Follow-up with Ortho as an outpatient  Hypertensive urgency Resolved Advised medication compliance Continue amlodipine 10 mg, metoprolol  Hypomagnesemia Replace PRN  Chronic macrocytic anemia B12 and folate as above Supplementation as above Follow-up with PCP  COPD Stable Continue inhaler  Alcohol/tobacco abuse About half a pint of vodka daily Smokes half PPD Advised to quit Nicotine patch         Malnutrition Type:      Malnutrition Characteristics:      Nutrition Interventions:      Estimated body mass index is 19.28 kg/m as calculated from the following:   Height as of this encounter: 6\' 2"  (1.88 m).   Weight as of this encounter: 68.1 kg.    Procedures:  None  Consultations:  None  Discharge Exam: BP 126/88 (BP Location: Left Arm)    Pulse 96    Temp 98.3 F (36.8 C) (Oral)    Resp 18    Ht 6\' 2"  (1.88 m)    Wt 68.1 kg    SpO2 100%    BMI 19.28 kg/m   General: NAD Cardiovascular: S1, S2 present Respiratory: Diminished breath sounds bilaterally  Discharge Instructions You were cared for by a hospitalist during your hospital stay. If you have any questions about your discharge medications or the care you received while you were in the hospital after you are discharged, you can call the unit and asked to speak with the hospitalist on call if the hospitalist that took care of you is not available. Once you are discharged, your primary care physician will handle any further medical issues. Please note that NO REFILLS for any discharge medications will be authorized once you are discharged, as it is imperative that you return to your primary care physician (or establish a relationship with a primary care physician if you do not have one) for your aftercare needs so that they can reassess your need for medications and monitor your lab values.   Allergies as of 02/24/2019   No Known Allergies       Medication List    STOP taking these medications   benzonatate 100 MG capsule Commonly known as: TESSALON     TAKE these medications   acetaminophen 500 MG tablet Commonly known as: TYLENOL Take 500-1,000 mg by mouth every 6 (six) hours as needed (for headaches).   albuterol 108 (90 Base) MCG/ACT inhaler Commonly known as: VENTOLIN HFA Inhale 2 puffs into the lungs every 6 (six) hours as needed for wheezing or shortness of breath.   amLODipine 10 MG tablet Commonly known as: NORVASC 1/2 tab by mouth daily with plans to increase in near future What changed:   how much to take  how to take this  when to take this  additional instructions   folic acid 1 MG tablet Commonly known as: FOLVITE Take 1 tablet (1 mg total) by mouth daily.   ibuprofen 200 MG tablet Commonly known as: ADVIL Take 200 mg by mouth every 6 (six) hours as needed.   metoprolol tartrate 25 MG tablet Commonly known as: LOPRESSOR Take 1 tablet (25 mg total) by mouth 2 (two) times daily for 30 days.   multivitamin tablet Take 1 tablet by mouth daily.   nicotine 21 mg/24hr patch  Commonly known as: NICODERM CQ - dosed in mg/24 hours Place 1 patch (21 mg total) onto the skin daily. Start taking on: February 25, 2019   thiamine 100 MG tablet Take 1 tablet (100 mg total) by mouth daily for 30 days. Start taking on: February 25, 2019   umeclidinium bromide 62.5 MCG/INH Aepb Commonly known as: Incruse Ellipta Inhale 1 puff into the lungs daily.   vitamin B-12 1000 MCG tablet Commonly known as: CYANOCOBALAMIN Take 1 tablet (1,000 mcg total) by mouth daily.      No Known Allergies Follow-up Information    Mack Hook, MD. Schedule an appointment as soon as possible for a visit in 1 week(s).   Specialty: Internal Medicine Contact information: [place] [address] [phone]        Gaynelle Arabian, MD. Call in 2 week(s).   Specialty: Orthopedic Surgery Why: Call to make  an appointment for your left leg fracture Contact information: [address] [place] [address] [phone]            The results of significant diagnostics from this hospitalization (including imaging, microbiology, ancillary and laboratory) are listed below for reference.    Significant Diagnostic Studies: Ct Head Wo Contrast  Result Date: 02/19/2019 CLINICAL DATA:  Recurrent syncope. Fell 2 weeks ago and again yesterday. Bilateral lower extremity weakness. EXAM: CT HEAD WITHOUT CONTRAST TECHNIQUE: Contiguous axial images were obtained from the base of the skull through the vertex without intravenous contrast. COMPARISON:  05/12/2009 FINDINGS: Brain: There is no evidence of acute infarct, intracranial hemorrhage, mass, midline shift, or extra-axial fluid collection. Mildly age advanced cerebral atrophy has progressed from the prior study. There is also advanced cerebellar atrophy with multiple small chronic bilateral cerebellar infarcts again noted. Bilateral cerebral white matter hypodensities have also progressed and are nonspecific but compatible with mild chronic small vessel ischemic disease. Vascular: Calcified atherosclerosis at the skull base. No hyperdense vessel. Skull: No fracture or focal osseous lesion. Sinuses/Orbits: Small right maxillary sinus mucous retention cyst. Clear mastoid air cells. Unremarkable orbits. Other: None. IMPRESSION: 1. No evidence of acute intracranial abnormality. 2. Progressive cerebral and cerebellar atrophy and chronic small vessel ischemic disease. Electronically Signed   By: Logan Bores M.D.   On: 02/19/2019 14:10   Ct Angio Chest Pe W And/or Wo Contrast  Result Date: 02/19/2019 CLINICAL DATA:  Shortness of breath EXAM: CT ANGIOGRAPHY CHEST WITH CONTRAST TECHNIQUE: Multidetector CT imaging of the chest was performed using the standard protocol during bolus administration of intravenous contrast. Multiplanar CT image  reconstructions and MIPs were obtained to evaluate the vascular anatomy. CONTRAST:  57mL OMNIPAQUE IOHEXOL 350 MG/ML SOLN COMPARISON:  Chest CT August 23, 2017 and chest CT July 13, 2016; chest radiograph February 19, 2019 FINDINGS: Cardiovascular: There is no demonstrable pulmonary embolus. Ascending thoracic aorta measures 4.0 x 4.0 cm in maximal transverse diameter. No dissection evident. Visualized great vessels appear unremarkable. There are foci of aortic atherosclerosis as well as foci of coronary artery calcification. There is no pericardial effusion or pericardial thickening evident. Mediastinum/Nodes: Thyroid appears unremarkable. There is no appreciable thoracic adenopathy. No esophageal lesions are evident. Lungs/Pleura: There is a stable 6 x 6 mm nodular opacity abutting the pleura in the posterior segment of the right upper lobe. There is mild centrilobular emphysematous change. There is no evident edema or consolidation. Upper Abdomen: There is hepatic steatosis. Visualized upper abdominal structures otherwise appear unremarkable except for aortic atherosclerosis. Musculoskeletal: Old healed fracture  of the sternum again noted. There are no blastic or lytic bone lesions. No chest wall lesions are evident. Review of the MIP images confirms the above findings. IMPRESSION: 1.  No demonstrable pulmonary embolus. 2. Ascending thoracic aortic diameter measures 4.0 x 4.0 cm, essentially stable. No dissection evident. There is aortic atherosclerosis as well as foci of coronary artery calcification. Recommend annual imaging followup by CTA or MRA. This recommendation follows 2010 ACCF/AHA/AATS/ACR/ASA/SCA/SCAI/SIR/STS/SVM Guidelines for the Diagnosis and Management of Patients with Thoracic Aortic Disease. Circulation. 2010; 121: J681-L572. Aortic aneurysm NOS (ICD10-I71.9). 3. Stable 6 mm nodular opacity right upper lobe. Stability over time since 2017 is indicative of benign etiology. Mild underlying  emphysematous change. No edema or consolidation. 4.  No evident adenopathy. 5.  Hepatic steatosis. Aortic Atherosclerosis (ICD10-I70.0) and Emphysema (ICD10-J43.9). Electronically Signed   By: Lowella Grip III M.D.   On: 02/19/2019 15:27   Dg Chest Port 1 View  Result Date: 02/19/2019 CLINICAL DATA:  Chest pain and shortness of breath. Patient fell 2 weeks ago and also yesterday. EXAM: PORTABLE CHEST 1 VIEW COMPARISON:  08/23/2017 FINDINGS: Heart size and vascularity are normal and the lungs are clear. Chronic blunting of the right costophrenic angle laterally, unchanged. No significant bone abnormality. IMPRESSION: No acute abnormalities. Electronically Signed   By: Lorriane Shire M.D.   On: 02/19/2019 14:06   Dg Knee Complete 4 Views Left  Result Date: 02/19/2019 CLINICAL DATA:  Left knee pain after fall yesterday. EXAM: LEFT KNEE - COMPLETE 4+ VIEW COMPARISON:  None. FINDINGS: Probable nondisplaced fracture is seen involving the proximal left fibula. No other fracture or dislocation is noted. No evidence of arthropathy or other focal bone abnormality. Vascular calcifications are noted. IMPRESSION: Probable nondisplaced fracture involving the proximal left fibula. Electronically Signed   By: Marijo Conception M.D.   On: 02/19/2019 11:52    Microbiology: Recent Results (from the past 240 hour(s))  SARS Coronavirus 2 (CEPHEID - Performed in [facility] hospital lab), Hosp Order     Status: None   Collection Time: 02/19/19  3:17 PM   Specimen: Nasopharyngeal Swab  Result Value Ref Range Status   SARS Coronavirus 2 NEGATIVE NEGATIVE Final    Comment: (NOTE) If result is NEGATIVE SARS-CoV-2 target nucleic acids are NOT DETECTED. The SARS-CoV-2 RNA is generally detectable in upper and lower  respiratory specimens during the acute phase of infection. The lowest  concentration of SARS-CoV-2 viral copies this assay can detect is 250  copies / mL. A negative result does not preclude SARS-CoV-2  infection  and should not be used as the sole basis for treatment or other  patient management decisions.  A negative result may occur with  improper specimen collection / handling, submission of specimen other  than nasopharyngeal swab, presence of viral mutation(s) within the  areas targeted by this assay, and inadequate number of viral copies  (<250 copies / mL). A negative result must be combined with clinical  observations, patient history, and epidemiological information. If result is POSITIVE SARS-CoV-2 target nucleic acids are DETECTED. The SARS-CoV-2 RNA is generally detectable in upper and lower  respiratory specimens dur ing the acute phase of infection.  Positive  results are indicative of active infection with SARS-CoV-2.  Clinical  correlation with patient history and other diagnostic information is  necessary to determine patient infection status.  Positive results do  not rule out bacterial infection or co-infection with other viruses. If result is PRESUMPTIVE POSTIVE SARS-CoV-2 nucleic acids MAY BE  PRESENT.   A presumptive positive result was obtained on the submitted specimen  and confirmed on repeat testing.  While 2019 novel coronavirus  (SARS-CoV-2) nucleic acids may be present in the submitted sample  additional confirmatory testing may be necessary for epidemiological  and / or clinical management purposes  to differentiate between  SARS-CoV-2 and other Sarbecovirus currently known to infect humans.  If clinically indicated additional testing with an alternate test  methodology [phone]) is advised. The SARS-CoV-2 RNA is generally  detectable in upper and lower respiratory sp ecimens during the acute  phase of infection. The expected result is Negative. Fact Sheet for Patients:  StrictlyIdeas.no Fact Sheet for Healthcare Providers: BankingDealers.co.za This test is not yet approved or cleared by the [place]  FDA and has been authorized for detection and/or diagnosis of SARS-CoV-2 by FDA under an Emergency Use Authorization (EUA).  This EUA will remain in effect (meaning this test can be used) for the duration of the COVID-19 declaration under Section 564(b)(1) of the Act, 21 U.S.C. section 360bbb-3(b)(1), unless the authorization is terminated or revoked sooner. Performed at [facility], [place] [address], [place], [place] [address]      Labs: Basic Metabolic Panel: Recent Labs  Lab 02/19/19 1301 02/19/19 1858 02/20/19 0612 02/21/19 0554 02/22/19 0531  NA 137  --  137 136 138  K 5.2*  --  4.0 3.6 4.1  CL 94*  --  99 101 105  CO2 22  --  25 24 23   GLUCOSE 97  --  105* 94 93  BUN 12  --  7* <5* 5*  CREATININE 1.12 0.90 0.86 0.69 0.66  CALCIUM 9.3  --  9.0 9.1 9.2  MG  --  1.6* 1.4* 1.9  --    Liver Function Tests: Recent Labs  Lab 02/19/19 1301 02/20/19 0612  AST 72* 101*  ALT 21 23  ALKPHOS 106 104  BILITOT 2.1* 1.5*  PROT 7.4 6.9  ALBUMIN 4.1 3.7   No results for input(s): LIPASE, AMYLASE in the last 168 hours. No results for input(s): AMMONIA in the last 168 hours. CBC: Recent Labs  Lab 02/19/19 1301 02/19/19 1858 02/20/19 0612 02/21/19 0554 02/22/19 0531 02/23/19 0550  WBC 7.3 5.7 6.4 6.1 7.7 6.5  NEUTROABS 5.1  --   --  4.6 5.9 4.6  HGB 10.2* 9.8* 10.6* 9.4* 9.9* 9.0*  HCT 30.8* 30.0*   28.5* 32.2* 29.7*   28.2* 32.0* 29.0*  MCV 105.5* 105.6* 104.9* 107.6* 109.6* 109.8*  PLT 166 152 170 158 166 179   Cardiac Enzymes: No results for input(s): CKTOTAL, CKMB, CKMBINDEX, TROPONINI in the last 168 hours. BNP: BNP (last 3 results) Recent Labs    02/19/19 1301  BNP 33.8    ProBNP (last 3 results) No results for input(s): PROBNP in the last 8760 hours.  CBG: No results for input(s): GLUCAP in the last 168 hours.     Signed:  Alma Friendly, MD [facility] 02/24/2019, 3:01 PM

## 2019-02-24 NOTE — Progress Notes (Signed)
Physical Therapy Treatment Patient Details Name: Seth [name] MRN: [id number] DOB: 27-Nov-1955 Today's Date: 02/24/2019    History of Present Illness 63 y.o. male with medical history significant of hypertension, alcohol dependence with peripheral neuropathy, tobacco abuse, COPD came to emergency department with complaints of weakness and falls. 2 recent falls landing on lt knee. xray showed left proximal fibular fx; CT angiogram of the chest which ruled out PE    PT Comments    Pt with improved ambulation distance this session, with use of cane for ambulation. Pt with improved steadiness vs last session, still somewhat unsteady with challenges to gait. Pt instructed to use some visual cues during ambulation and stair navigation for foot placement and clearance, as pt struggles with this. PT recommending HHPT, pt states his daughter can help him 24/7.    Follow Up Recommendations  Home health PT;Supervision/Assistance - 24 hour     Equipment Recommendations  Rolling Burby with 5" wheels    Recommendations for Other Services OT consult     Precautions / Restrictions Precautions Precautions: Fall;Other (comment) Precaution Comments: monitor HR; very tachy; 3 falls in the past month Required Braces or Orthoses: Knee Immobilizer - Left Knee Immobilizer - Left: On when out of bed or walking Restrictions Other Position/Activity Restrictions: WBAT    Mobility  Bed Mobility Overal bed mobility: Needs Assistance Bed Mobility: Supine to Sit     Supine to sit: Supervision;HOB elevated     General bed mobility comments: Supervision for safety, increased time with use of bedrails. LE tremors noted.  Transfers Overall transfer level: Needs assistance Equipment used: Rolling Lighty (2 wheeled) Transfers: Sit to/from Stand Sit to Stand: Min guard         General transfer comment: Min guard for safety, pt with heavy anterior leaning with standing and reaching for environment,  defers cane use for ambulation to bathroom.  Ambulation/Gait Ambulation/Gait assistance: Min guard;+2 safety/equipment;Supervision Gait Distance (Feet): 270 Feet Assistive device: Straight cane Gait Pattern/deviations: Step-through pattern;Trunk flexed Gait velocity: slightly decr   General Gait Details: Min guard to supervision for safety. PT shortened pt's SPC, as pt had it set much too high for height. Verbal cuing for steady gait and taking time to ambulate safely as needed.   Stairs Stairs: Yes Stairs assistance: Min guard Stair Management: One rail Right;Step to pattern;Forwards;With cane Number of Stairs: 10 General stair comments: Min guard for safety, verbal cuing for sequencing, placement of cane on step, using visual cues for foot placement as pt with placing feet on top of each other with descending steps.   Wheelchair Mobility    Modified Rankin (Stroke Patients Only)       Balance Overall balance assessment: History of Falls;Needs assistance Sitting-balance support: No upper extremity supported Sitting balance-[name] Scale: Good     Standing balance support: Single extremity supported Standing balance-[name] Scale: Fair                              Cognition Arousal/Alertness: Awake/alert Behavior During Therapy: WFL for tasks assessed/performed Overall Cognitive Status: Within Functional Limits for tasks assessed                                 General Comments: not specifically tested      Exercises      General Comments        Pertinent Vitals/Pain Pain  Assessment: No/denies pain    Home Living                      Prior Function            PT Goals (current goals can now be found in the care plan section) Acute Rehab PT Goals Patient Stated Goal: go home with daughter's support PT Goal Formulation: With patient Time For Goal Achievement: 03/07/19 Potential to Achieve Goals: Good Progress towards PT  goals: Progressing toward goals    Frequency    Min 4X/week      PT Plan Current plan remains appropriate    Co-evaluation              AM-PAC PT "6 Clicks" Mobility   Outcome Measure  Help needed turning from your back to your side while in a flat bed without using bedrails?: None Help needed moving from lying on your back to sitting on the side of a flat bed without using bedrails?: None Help needed moving to and from a bed to a chair (including a wheelchair)?: A Little Help needed standing up from a chair using your arms (e.g., wheelchair or bedside chair)?: A Little Help needed to walk in hospital room?: A Little Help needed climbing 3-5 steps with a railing? : A Little 6 Click Score: 20    End of Session Equipment Utilized During Treatment: Gait belt;Left knee immobilizer Activity Tolerance: Patient tolerated treatment well Patient left: in chair;with call bell/phone within reach;with chair alarm set Nurse Communication: Mobility status PT Visit Diagnosis: Unsteadiness on feet (R26.81);Repeated falls (R29.6)     Time: 0315-9458 PT Time Calculation (min) (ACUTE ONLY): 15 min  Charges:  $Gait Training: 8-22 mins                    Julien Girt, PT Acute Rehabilitation Services Pager [phone]  Office [phone]    Reiley Bertagnolli D Brett Soza 02/24/2019, 3:20 PM

## 2019-02-24 NOTE — Progress Notes (Signed)
Seth [name] to be D/C'd Home per MD order.  Discussed prescriptions and follow up appointments with the patient. Prescriptions given to patient, medication list explained in detail. Pt verbalized understanding.  Allergies as of 02/24/2019   No Known Allergies     Medication List    STOP taking these medications   benzonatate 100 MG capsule Commonly known as: TESSALON     TAKE these medications   acetaminophen 500 MG tablet Commonly known as: TYLENOL Take 500-1,000 mg by mouth every 6 (six) hours as needed (for headaches).   albuterol 108 (90 Base) MCG/ACT inhaler Commonly known as: VENTOLIN HFA Inhale 2 puffs into the lungs every 6 (six) hours as needed for wheezing or shortness of breath.   amLODipine 10 MG tablet Commonly known as: NORVASC 1/2 tab by mouth daily with plans to increase in near future What changed:   how much to take  how to take this  when to take this  additional instructions   folic acid 1 MG tablet Commonly known as: FOLVITE Take 1 tablet (1 mg total) by mouth daily.   ibuprofen 200 MG tablet Commonly known as: ADVIL Take 200 mg by mouth every 6 (six) hours as needed.   metoprolol tartrate 25 MG tablet Commonly known as: LOPRESSOR Take 1 tablet (25 mg total) by mouth 2 (two) times daily for 30 days.   multivitamin tablet Take 1 tablet by mouth daily.   nicotine 21 mg/24hr patch Commonly known as: NICODERM CQ - dosed in mg/24 hours Place 1 patch (21 mg total) onto the skin daily. Start taking on: February 25, 2019   thiamine 100 MG tablet Take 1 tablet (100 mg total) by mouth daily for 30 days. Start taking on: February 25, 2019   umeclidinium bromide 62.5 MCG/INH Aepb Commonly known as: Incruse Ellipta Inhale 1 puff into the lungs daily.   vitamin B-12 1000 MCG tablet Commonly known as: CYANOCOBALAMIN Take 1 tablet (1,000 mcg total) by mouth daily.       Vitals:   02/23/19 2105 02/24/19 0440  BP: (!) 147/85 126/88  Pulse: (!)  108 96  Resp: 19 18  Temp: 98.4 F (36.9 C) 98.3 F (36.8 C)  SpO2: 100% 100%    Skin clean, dry and intact without evidence of skin break down, no evidence of skin tears noted. IV catheter discontinued intact. Site without signs and symptoms of complications. Dressing and pressure applied. Pt denies pain at this time. No complaints noted.  An After Visit Summary was printed and given to the patient. Patient escorted via [place], and D/C home via private auto.  Lolita Rieger 02/24/2019 4:58 PM

## 2019-02-24 NOTE — TOC Transition Note (Signed)
Transition of Care [facility]) - CM/SW Discharge Note   Patient Details  Name: Seth [name] MRN: [id number] Date of Birth: 14-Jan-1956  Transition of Care [place]) CM/SW Contact:  Servando Snare, LCSW Phone Number: 02/24/2019, 4:01 PM   Clinical Narrative:  LCSW consulted at dc for home health. Patient does not have insurance and needs charity. LCSW contacted [facility] for home health needs.      Final next level of care: [place] Barriers to Discharge: No Barriers Identified   Patient Goals and CMS Choice Patient states their goals for this hospitalization and ongoing recovery are:: Return home CMS Medicare.gov Compare Post Acute Care list provided to:: Patient Choice offered to / list presented to : Patient  Discharge Placement                       Discharge Plan and Services                DME Arranged: N/A DME Agency: NA       HH Arranged: PT HH Agency: [place] Date [place]: 02/24/19 Time [place]: 1600 Representative spoke with at [place]: [place] ([place]) Interventions     Readmission Risk Interventions No flowsheet data found.

## 2019-02-25 LAB — VITAMIN B1: Vitamin B1 (Thiamine): 89.1 nmol/L (ref 66.5–200.0)

## 2019-03-18 ENCOUNTER — Ambulatory Visit: Payer: Self-pay | Admitting: Internal Medicine

## 2019-04-08 ENCOUNTER — Other Ambulatory Visit: Payer: Self-pay | Admitting: Internal Medicine

## 2019-05-12 ENCOUNTER — Ambulatory Visit: Payer: Self-pay | Admitting: Internal Medicine

## 2019-05-31 ENCOUNTER — Encounter (HOSPITAL_COMMUNITY): Payer: Self-pay

## 2019-05-31 ENCOUNTER — Other Ambulatory Visit: Payer: Self-pay

## 2019-05-31 ENCOUNTER — Emergency Department (HOSPITAL_COMMUNITY): Payer: Medicaid Other

## 2019-05-31 ENCOUNTER — Inpatient Hospital Stay (HOSPITAL_COMMUNITY)
Admission: EM | Admit: 2019-05-31 | Discharge: 2019-06-04 | DRG: 312 | Disposition: A | Discharge status: Home Health | Payer: Medicaid Other | Attending: Internal Medicine | Admitting: Internal Medicine

## 2019-05-31 DIAGNOSIS — Z23 Encounter for immunization: Secondary | ICD-10-CM

## 2019-05-31 DIAGNOSIS — E876 Hypokalemia: Secondary | ICD-10-CM | POA: Diagnosis present

## 2019-05-31 DIAGNOSIS — R531 Weakness: Secondary | ICD-10-CM | POA: Diagnosis not present

## 2019-05-31 DIAGNOSIS — Z681 Body mass index (BMI) 19 or less, adult: Secondary | ICD-10-CM

## 2019-05-31 DIAGNOSIS — D638 Anemia in other chronic diseases classified elsewhere: Secondary | ICD-10-CM | POA: Diagnosis present

## 2019-05-31 DIAGNOSIS — R0789 Other chest pain: Secondary | ICD-10-CM | POA: Diagnosis present

## 2019-05-31 DIAGNOSIS — F1090 Alcohol use, unspecified, uncomplicated: Secondary | ICD-10-CM | POA: Diagnosis present

## 2019-05-31 DIAGNOSIS — I10 Essential (primary) hypertension: Secondary | ICD-10-CM | POA: Diagnosis present

## 2019-05-31 DIAGNOSIS — I712 Thoracic aortic aneurysm, without rupture: Secondary | ICD-10-CM | POA: Diagnosis present

## 2019-05-31 DIAGNOSIS — R0600 Dyspnea, unspecified: Secondary | ICD-10-CM | POA: Diagnosis not present

## 2019-05-31 DIAGNOSIS — J449 Chronic obstructive pulmonary disease, unspecified: Secondary | ICD-10-CM | POA: Diagnosis present

## 2019-05-31 DIAGNOSIS — K760 Fatty (change of) liver, not elsewhere classified: Secondary | ICD-10-CM | POA: Diagnosis present

## 2019-05-31 DIAGNOSIS — N179 Acute kidney failure, unspecified: Secondary | ICD-10-CM | POA: Diagnosis present

## 2019-05-31 DIAGNOSIS — Z9114 Patient's other noncompliance with medication regimen: Secondary | ICD-10-CM

## 2019-05-31 DIAGNOSIS — F17210 Nicotine dependence, cigarettes, uncomplicated: Secondary | ICD-10-CM | POA: Diagnosis present

## 2019-05-31 DIAGNOSIS — Z8249 Family history of ischemic heart disease and other diseases of the circulatory system: Secondary | ICD-10-CM

## 2019-05-31 DIAGNOSIS — R000 Tachycardia, unspecified: Secondary | ICD-10-CM | POA: Diagnosis present

## 2019-05-31 DIAGNOSIS — E872 Acidosis: Secondary | ICD-10-CM

## 2019-05-31 DIAGNOSIS — I951 Orthostatic hypotension: Principal | ICD-10-CM | POA: Diagnosis present

## 2019-05-31 DIAGNOSIS — R296 Repeated falls: Secondary | ICD-10-CM | POA: Diagnosis present

## 2019-05-31 DIAGNOSIS — R079 Chest pain, unspecified: Secondary | ICD-10-CM | POA: Diagnosis present

## 2019-05-31 DIAGNOSIS — F1020 Alcohol dependence, uncomplicated: Secondary | ICD-10-CM

## 2019-05-31 DIAGNOSIS — Z20828 Contact with and (suspected) exposure to other viral communicable diseases: Secondary | ICD-10-CM | POA: Diagnosis present

## 2019-05-31 DIAGNOSIS — E8729 Other acidosis: Secondary | ICD-10-CM

## 2019-05-31 DIAGNOSIS — R0609 Other forms of dyspnea: Secondary | ICD-10-CM | POA: Diagnosis present

## 2019-05-31 DIAGNOSIS — E440 Moderate protein-calorie malnutrition: Secondary | ICD-10-CM | POA: Diagnosis present

## 2019-05-31 LAB — CBC WITH DIFFERENTIAL/PLATELET
Abs Immature Granulocytes: 0.04 10*3/uL (ref 0.00–0.07)
Basophils Absolute: 0 10*3/uL (ref 0.0–0.1)
Basophils Relative: 0 %
Eosinophils Absolute: 0.1 10*3/uL (ref 0.0–0.5)
Eosinophils Relative: 1 %
HCT: 35.4 % — ABNORMAL LOW (ref 39.0–52.0)
Hemoglobin: 11.5 g/dL — ABNORMAL LOW (ref 13.0–17.0)
Immature Granulocytes: 1 %
Lymphocytes Relative: 17 %
Lymphs Abs: 1 10*3/uL (ref 0.7–4.0)
MCH: 32.4 pg (ref 26.0–34.0)
MCHC: 32.5 g/dL (ref 30.0–36.0)
MCV: 99.7 fL (ref 80.0–100.0)
Monocytes Absolute: 0.5 10*3/uL (ref 0.1–1.0)
Monocytes Relative: 9 %
Neutro Abs: 4.6 10*3/uL (ref 1.7–7.7)
Neutrophils Relative %: 72 %
Platelets: 325 10*3/uL (ref 150–400)
RBC: 3.55 MIL/uL — ABNORMAL LOW (ref 4.22–5.81)
RDW: 17.5 % — ABNORMAL HIGH (ref 11.5–15.5)
WBC: 6.3 10*3/uL (ref 4.0–10.5)
nRBC: 0 % (ref 0.0–0.2)

## 2019-05-31 LAB — URINALYSIS, COMPLETE (UACMP) WITH MICROSCOPIC
Bacteria, UA: NONE SEEN
Bilirubin Urine: NEGATIVE
Glucose, UA: NEGATIVE mg/dL
Hgb urine dipstick: NEGATIVE
Ketones, ur: 20 mg/dL — AB
Leukocytes,Ua: NEGATIVE
Nitrite: NEGATIVE
Protein, ur: 30 mg/dL — AB
Specific Gravity, Urine: 1.012 (ref 1.005–1.030)
pH: 6 (ref 5.0–8.0)

## 2019-05-31 LAB — BRAIN NATRIURETIC PEPTIDE: B Natriuretic Peptide: 39.3 pg/mL (ref 0.0–100.0)

## 2019-05-31 LAB — COMPREHENSIVE METABOLIC PANEL
ALT: 16 U/L (ref 0–44)
AST: 40 U/L (ref 15–41)
Albumin: 3.1 g/dL — ABNORMAL LOW (ref 3.5–5.0)
Alkaline Phosphatase: 146 U/L — ABNORMAL HIGH (ref 38–126)
Anion gap: 24 — ABNORMAL HIGH (ref 5–15)
BUN: 11 mg/dL (ref 8–23)
CO2: 19 mmol/L — ABNORMAL LOW (ref 22–32)
Calcium: 10 mg/dL (ref 8.9–10.3)
Chloride: 92 mmol/L — ABNORMAL LOW (ref 98–111)
Creatinine, Ser: 1.35 mg/dL — ABNORMAL HIGH (ref 0.61–1.24)
GFR calc Af Amer: >60 mL/min (ref >60)
GFR calc non Af Amer: 56 mL/min — ABNORMAL LOW (ref >60)
Glucose, Bld: 84 mg/dL (ref 70–99)
Potassium: 4.3 mmol/L (ref 3.5–5.1)
Sodium: 135 mmol/L (ref 135–145)
Total Bilirubin: 1.4 mg/dL — ABNORMAL HIGH (ref 0.3–1.2)
Total Protein: 7.1 g/dL (ref 6.5–8.1)

## 2019-05-31 LAB — LACTIC ACID, PLASMA: Lactic Acid, Venous: 1.2 mmol/L (ref 0.5–1.9)

## 2019-05-31 LAB — MAGNESIUM: Magnesium: 1.5 mg/dL — ABNORMAL LOW (ref 1.7–2.4)

## 2019-05-31 LAB — PHOSPHORUS: Phosphorus: 2.8 mg/dL (ref 2.5–4.6)

## 2019-05-31 LAB — ETHANOL: Alcohol, Ethyl (B): <10 mg/dL (ref <10)

## 2019-05-31 LAB — TROPONIN I (HIGH SENSITIVITY)
Troponin I (High Sensitivity): 12 ng/L (ref <18)
Troponin I (High Sensitivity): 13 ng/L (ref <18)

## 2019-05-31 IMAGING — CR DG CHEST 2V
2 series · 2 of 2 positions shown · non-contrast
Comparison: [DATE] chest radiograph.

CLINICAL DATA: Chest pain and dyspnea

EXAM:
CHEST - 2 VIEW

[chest lat]
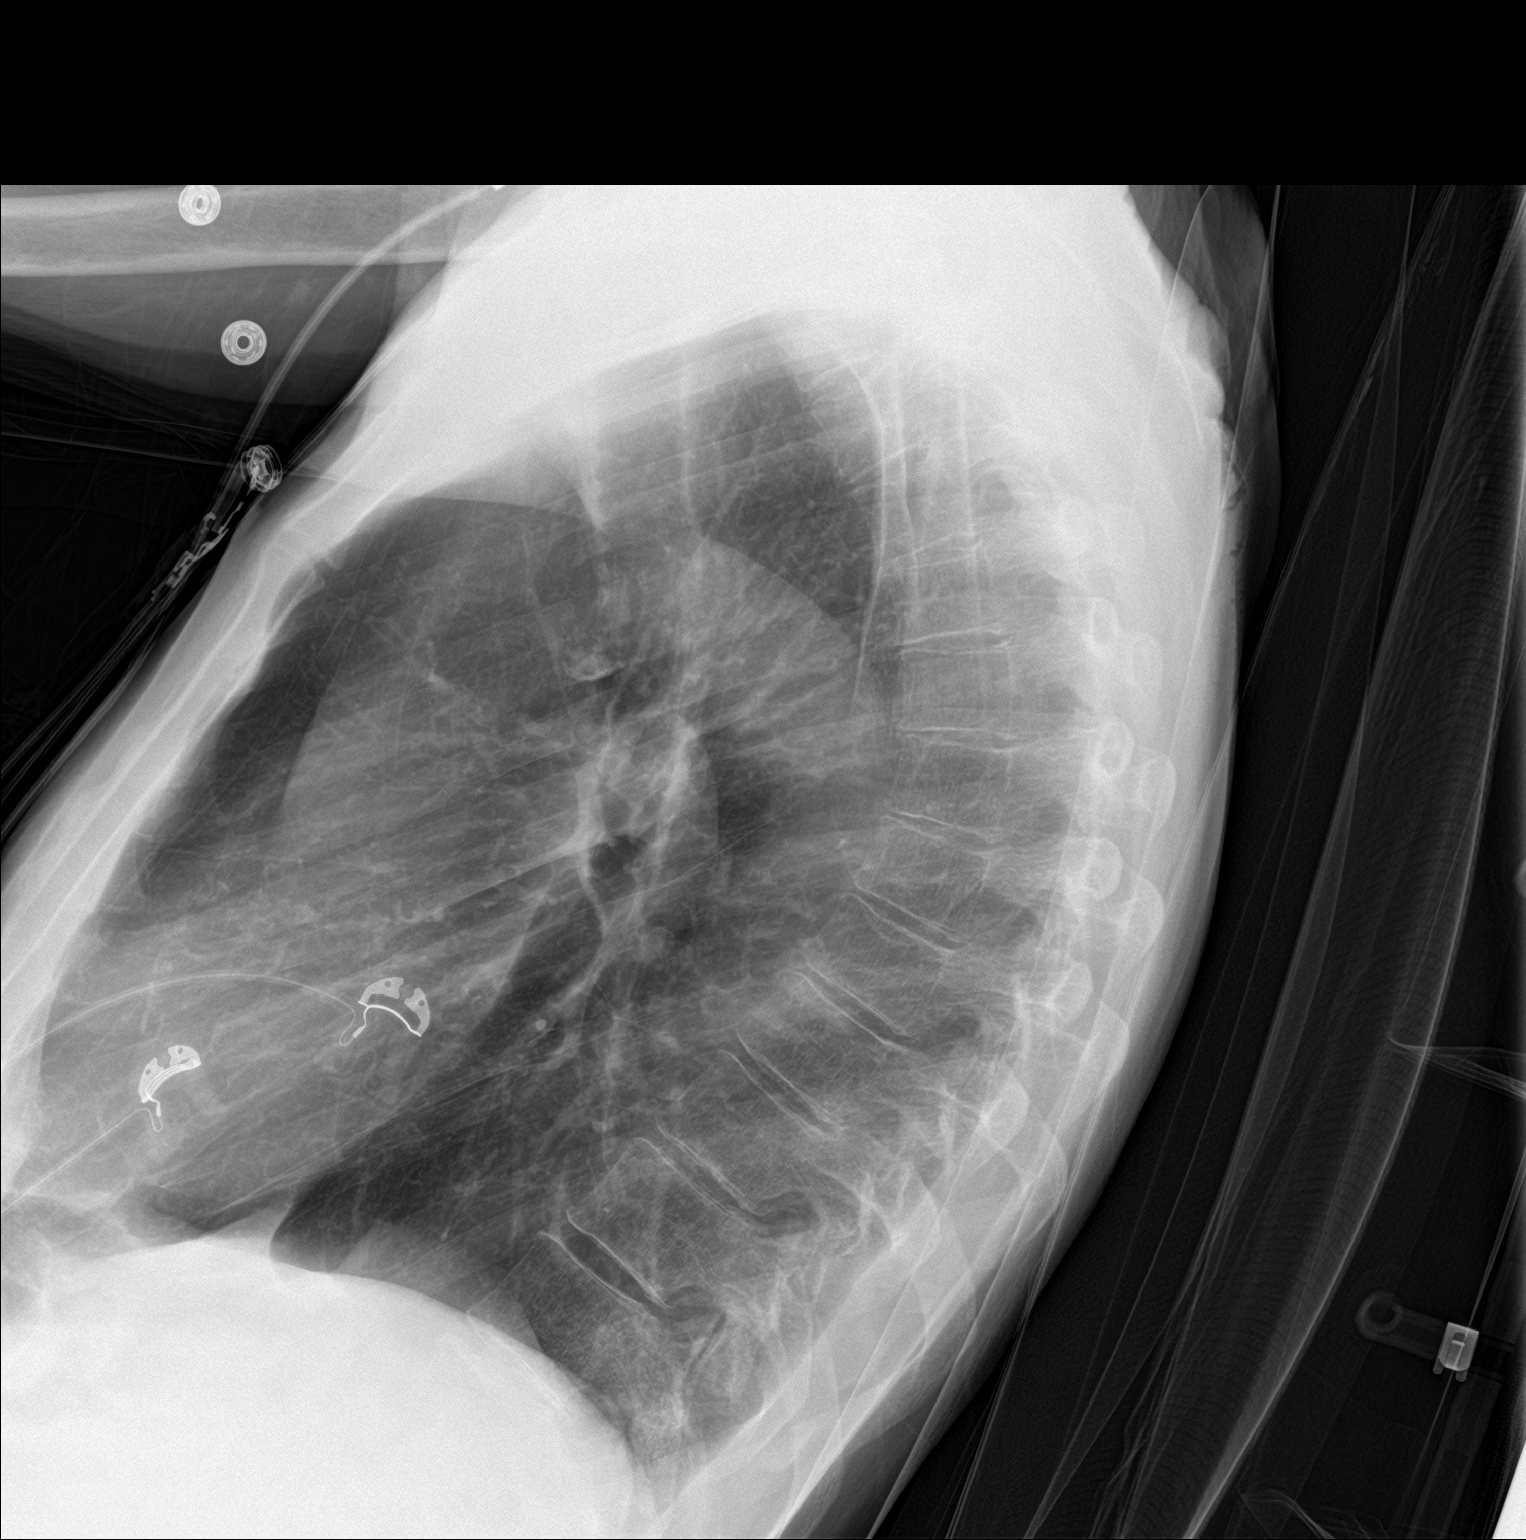

[chest ap]
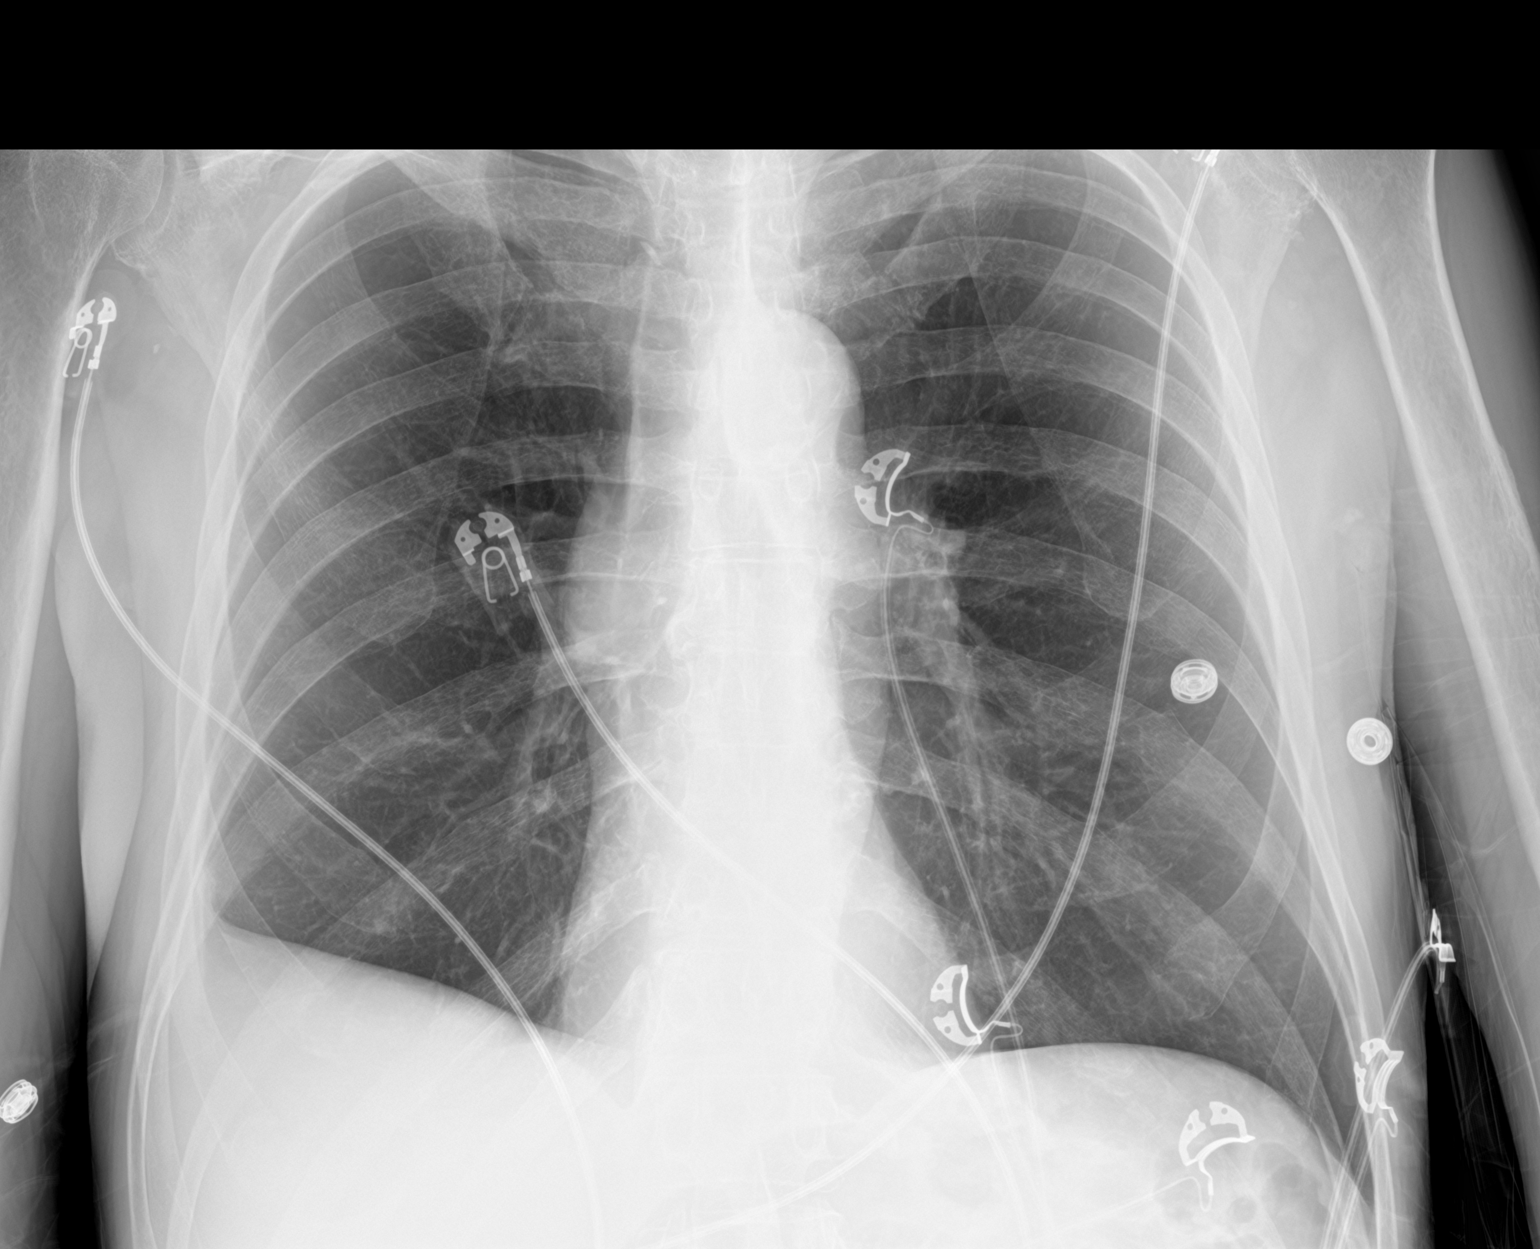

[2 of 2 positions shown; findings below may reference images not displayed]

FINDINGS: Stable cardiomediastinal silhouette with normal heart size. No
pneumothorax. Chronic blunting of the right costophrenic angle. No
left pleural effusion. Lungs appear clear, with no acute
consolidative airspace disease and no pulmonary edema.
IMPRESSION: No active cardiopulmonary disease. Chronic blunting of the right
costophrenic angle compatible with pleural-parenchymal scarring.

## 2019-05-31 MED ORDER — FOLIC ACID 1 MG PO TABS
1.0000 mg | ORAL_TABLET | Freq: Every day | ORAL | Status: DC
  Administered 2019-06-01 – 2019-06-04 (×4): 1 mg via ORAL
  Filled 2019-05-31 (×5): qty 1

## 2019-05-31 MED ORDER — THIAMINE HCL 100 MG/ML IJ SOLN
100.0000 mg | Freq: Every day | INTRAMUSCULAR | Status: DC
  Filled 2019-05-31 (×3): qty 2

## 2019-05-31 MED ORDER — ONDANSETRON HCL 4 MG/2ML IJ SOLN: 4.0000 mg | Freq: Four times a day (QID) | INTRAMUSCULAR | Status: DC | PRN

## 2019-05-31 MED ORDER — ONDANSETRON HCL 4 MG PO TABS: 4.0000 mg | ORAL_TABLET | Freq: Four times a day (QID) | ORAL | Status: DC | PRN

## 2019-05-31 MED ORDER — SODIUM CHLORIDE 0.9 % IV BOLUS
1000.0000 mL | Freq: Once | INTRAVENOUS | Status: AC
  Administered 2019-05-31: 15:00:00 1000 mL via INTRAVENOUS

## 2019-05-31 MED ORDER — ENOXAPARIN SODIUM 40 MG/0.4ML ~~LOC~~ SOLN
40.0000 mg | SUBCUTANEOUS | Status: DC
  Administered 2019-05-31 – 2019-06-03 (×4): 40 mg via SUBCUTANEOUS
  Filled 2019-05-31 (×4): qty 0.4

## 2019-05-31 MED ORDER — ADULT MULTIVITAMIN W/MINERALS CH
1.0000 | ORAL_TABLET | Freq: Every day | ORAL | Status: DC
  Administered 2019-06-01 – 2019-06-04 (×4): 1 via ORAL
  Filled 2019-05-31 (×4): qty 1

## 2019-05-31 MED ORDER — VITAMIN B-12 1000 MCG PO TABS
1000.0000 ug | ORAL_TABLET | Freq: Every day | ORAL | Status: DC
  Administered 2019-06-01 – 2019-06-04 (×4): 1000 ug via ORAL
  Filled 2019-05-31 (×4): qty 1

## 2019-05-31 MED ORDER — LORAZEPAM 1 MG PO TABS
1.0000 mg | ORAL_TABLET | ORAL | Status: AC | PRN
  Administered 2019-05-31: 21:00:00 1 mg via ORAL
  Filled 2019-05-31: qty 1

## 2019-05-31 MED ORDER — ALBUTEROL SULFATE (2.5 MG/3ML) 0.083% IN NEBU: 3.0000 mL | INHALATION_SOLUTION | Freq: Four times a day (QID) | RESPIRATORY_TRACT | Status: DC | PRN

## 2019-05-31 MED ORDER — LORAZEPAM 2 MG/ML IJ SOLN: 1.0000 mg | INTRAMUSCULAR | Status: AC | PRN

## 2019-05-31 MED ORDER — UMECLIDINIUM BROMIDE 62.5 MCG/INH IN AEPB
1.0000 | INHALATION_SPRAY | Freq: Every day | RESPIRATORY_TRACT | Status: DC
  Administered 2019-06-01 – 2019-06-04 (×4): 1 via RESPIRATORY_TRACT
  Filled 2019-05-31: qty 7

## 2019-05-31 MED ORDER — METOPROLOL TARTRATE 25 MG PO TABS: 25.0000 mg | ORAL_TABLET | Freq: Two times a day (BID) | ORAL | Status: DC

## 2019-05-31 MED ORDER — THIAMINE HCL 100 MG/ML IJ SOLN
Freq: Once | INTRAVENOUS | Status: AC
  Administered 2019-05-31: 20:00:00 via INTRAVENOUS
  Filled 2019-05-31: qty 1000

## 2019-05-31 MED ORDER — ACETAMINOPHEN 650 MG RE SUPP: 650.0000 mg | Freq: Four times a day (QID) | RECTAL | Status: DC | PRN

## 2019-05-31 MED ORDER — ACETAMINOPHEN 325 MG PO TABS: 650.0000 mg | ORAL_TABLET | Freq: Four times a day (QID) | ORAL | Status: DC | PRN

## 2019-05-31 MED ORDER — VITAMIN B-1 100 MG PO TABS
100.0000 mg | ORAL_TABLET | Freq: Every day | ORAL | Status: DC
  Administered 2019-06-01 – 2019-06-04 (×4): 100 mg via ORAL
  Filled 2019-05-31 (×5): qty 1
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
## 2019-05-31 NOTE — ED Notes (Signed)
Patient transported to X-ray 

## 2019-05-31 NOTE — ED Notes (Addendum)
Hina, PA notified of pt's increase in chest discomfort; Pt given beverage per Hina, PA

## 2019-05-31 NOTE — ED Notes (Signed)
Pr reminded that a urine sample is needed, pt states he "will try again"; will continue to monitor

## 2019-05-31 NOTE — ED Triage Notes (Addendum)
Pt from home via ems; c/o sob and CP x 6 months; seed in ED 2 months ago for same, given prescription for metoprolol and Incruse inhaler, says no improvement; tachy 120 with EMS, sinus; CP and SOB worse with exertion; Central cp, non radiating, hx stroke; pt also endorses decreased appetite over past 6 months; pt endorses drinking 1/2 pint vodka/day, last drink last night  120/90 97.60F 97% RA RR 14, regular, unlabored

## 2019-05-31 NOTE — ED Notes (Signed)
Attempted to call report

## 2019-06-01 ENCOUNTER — Encounter (HOSPITAL_COMMUNITY): Payer: Self-pay

## 2019-06-01 DIAGNOSIS — Z20828 Contact with and (suspected) exposure to other viral communicable diseases: Secondary | ICD-10-CM | POA: Diagnosis present

## 2019-06-01 DIAGNOSIS — R0600 Dyspnea, unspecified: Secondary | ICD-10-CM | POA: Diagnosis not present

## 2019-06-01 DIAGNOSIS — E876 Hypokalemia: Secondary | ICD-10-CM | POA: Diagnosis present

## 2019-06-01 DIAGNOSIS — R296 Repeated falls: Secondary | ICD-10-CM | POA: Diagnosis present

## 2019-06-01 DIAGNOSIS — I10 Essential (primary) hypertension: Secondary | ICD-10-CM | POA: Diagnosis present

## 2019-06-01 DIAGNOSIS — Z8249 Family history of ischemic heart disease and other diseases of the circulatory system: Secondary | ICD-10-CM | POA: Diagnosis not present

## 2019-06-01 DIAGNOSIS — F1020 Alcohol dependence, uncomplicated: Secondary | ICD-10-CM | POA: Diagnosis present

## 2019-06-01 DIAGNOSIS — R0789 Other chest pain: Secondary | ICD-10-CM | POA: Diagnosis present

## 2019-06-01 DIAGNOSIS — Z23 Encounter for immunization: Secondary | ICD-10-CM | POA: Diagnosis not present

## 2019-06-01 DIAGNOSIS — I951 Orthostatic hypotension: Secondary | ICD-10-CM | POA: Diagnosis present

## 2019-06-01 DIAGNOSIS — E440 Moderate protein-calorie malnutrition: Secondary | ICD-10-CM | POA: Diagnosis present

## 2019-06-01 DIAGNOSIS — Z681 Body mass index (BMI) 19 or less, adult: Secondary | ICD-10-CM | POA: Diagnosis not present

## 2019-06-01 DIAGNOSIS — K760 Fatty (change of) liver, not elsewhere classified: Secondary | ICD-10-CM | POA: Diagnosis present

## 2019-06-01 DIAGNOSIS — F17210 Nicotine dependence, cigarettes, uncomplicated: Secondary | ICD-10-CM | POA: Diagnosis present

## 2019-06-01 DIAGNOSIS — R079 Chest pain, unspecified: Secondary | ICD-10-CM | POA: Diagnosis not present

## 2019-06-01 DIAGNOSIS — Z9114 Patient's other noncompliance with medication regimen: Secondary | ICD-10-CM | POA: Diagnosis not present

## 2019-06-01 DIAGNOSIS — N179 Acute kidney failure, unspecified: Secondary | ICD-10-CM | POA: Diagnosis present

## 2019-06-01 DIAGNOSIS — J449 Chronic obstructive pulmonary disease, unspecified: Secondary | ICD-10-CM | POA: Diagnosis present

## 2019-06-01 DIAGNOSIS — I712 Thoracic aortic aneurysm, without rupture: Secondary | ICD-10-CM | POA: Diagnosis present

## 2019-06-01 DIAGNOSIS — D638 Anemia in other chronic diseases classified elsewhere: Secondary | ICD-10-CM | POA: Diagnosis present

## 2019-06-01 DIAGNOSIS — E872 Acidosis: Secondary | ICD-10-CM | POA: Diagnosis not present

## 2019-06-01 LAB — BASIC METABOLIC PANEL
Anion gap: 15 (ref 5–15)
BUN: 9 mg/dL (ref 8–23)
CO2: 21 mmol/L — ABNORMAL LOW (ref 22–32)
Calcium: 8.3 mg/dL — ABNORMAL LOW (ref 8.9–10.3)
Chloride: 96 mmol/L — ABNORMAL LOW (ref 98–111)
Creatinine, Ser: 1.18 mg/dL (ref 0.61–1.24)
GFR calc Af Amer: >60 mL/min (ref >60)
GFR calc non Af Amer: >60 mL/min (ref >60)
Glucose, Bld: 113 mg/dL — ABNORMAL HIGH (ref 70–99)
Potassium: 2.9 mmol/L — ABNORMAL LOW (ref 3.5–5.1)
Sodium: 132 mmol/L — ABNORMAL LOW (ref 135–145)

## 2019-06-01 LAB — CBC
HCT: 28.7 % — ABNORMAL LOW (ref 39.0–52.0)
Hemoglobin: 9.3 g/dL — ABNORMAL LOW (ref 13.0–17.0)
MCH: 32.2 pg (ref 26.0–34.0)
MCHC: 32.4 g/dL (ref 30.0–36.0)
MCV: 99.3 fL (ref 80.0–100.0)
Platelets: 259 10*3/uL (ref 150–400)
RBC: 2.89 MIL/uL — ABNORMAL LOW (ref 4.22–5.81)
RDW: 17 % — ABNORMAL HIGH (ref 11.5–15.5)
WBC: 7.3 10*3/uL (ref 4.0–10.5)
nRBC: 0.3 % — ABNORMAL HIGH (ref 0.0–0.2)

## 2019-06-01 LAB — RAPID URINE DRUG SCREEN, HOSP PERFORMED
Amphetamines: NOT DETECTED
Barbiturates: NOT DETECTED
Benzodiazepines: NOT DETECTED
Cocaine: NOT DETECTED
Opiates: NOT DETECTED
Tetrahydrocannabinol: NOT DETECTED

## 2019-06-01 LAB — BETA-HYDROXYBUTYRIC ACID: Beta-Hydroxybutyric Acid: 4.62 mmol/L — ABNORMAL HIGH (ref 0.05–0.27)

## 2019-06-01 LAB — SARS CORONAVIRUS 2 (TAT 6-24 HRS): SARS Coronavirus 2: NEGATIVE

## 2019-06-01 LAB — LACTIC ACID, PLASMA: Lactic Acid, Venous: 1.3 mmol/L (ref 0.5–1.9)

## 2019-06-01 MED ORDER — SPIRIVA HANDIHALER 18 MCG IN CAPS: 18.0000 ug | ORAL_CAPSULE | Freq: Every day | RESPIRATORY_TRACT | 2 refills | Status: DC

## 2019-06-01 MED ORDER — TIOTROPIUM BROMIDE MONOHYDRATE 18 MCG IN CAPS: 18.0000 ug | ORAL_CAPSULE | Freq: Every day | RESPIRATORY_TRACT | Status: DC

## 2019-06-01 MED ORDER — ALBUTEROL SULFATE HFA 108 (90 BASE) MCG/ACT IN AERS: 2.0000 | INHALATION_SPRAY | Freq: Four times a day (QID) | RESPIRATORY_TRACT | 0 refills | Status: DC | PRN

## 2019-06-01 MED ORDER — GUAIFENESIN-DM 100-10 MG/5ML PO SYRP: 5.0000 mL | ORAL_SOLUTION | ORAL | 0 refills | Status: DC | PRN

## 2019-06-01 MED ORDER — MIDODRINE HCL 5 MG PO TABS
5.0000 mg | ORAL_TABLET | Freq: Two times a day (BID) | ORAL | Status: DC
  Administered 2019-06-01 – 2019-06-02 (×2): 5 mg via ORAL
  Filled 2019-06-01 (×2): qty 1

## 2019-06-01 MED ORDER — ADULT MULTIVITAMIN W/MINERALS CH: 1.0000 | ORAL_TABLET | Freq: Every day | ORAL | 0 refills | Status: DC

## 2019-06-01 MED ORDER — MOMETASONE FURO-FORMOTEROL FUM 100-5 MCG/ACT IN AERO
2.0000 | INHALATION_SPRAY | Freq: Two times a day (BID) | RESPIRATORY_TRACT | Status: DC
  Administered 2019-06-02 – 2019-06-04 (×5): 2 via RESPIRATORY_TRACT
  Filled 2019-06-01: qty 8.8

## 2019-06-01 MED ORDER — FOLIC ACID 1 MG PO TABS: 1.0000 mg | ORAL_TABLET | Freq: Every day | ORAL | 0 refills | Status: DC

## 2019-06-01 MED ORDER — SODIUM CHLORIDE 0.9 % IV BOLUS
1000.0000 mL | Freq: Once | INTRAVENOUS | Status: AC
  Administered 2019-06-01: 1000 mL via INTRAVENOUS

## 2019-06-01 MED ORDER — GUAIFENESIN-DM 100-10 MG/5ML PO SYRP: 5.0000 mL | ORAL_SOLUTION | ORAL | Status: DC | PRN

## 2019-06-01 MED ORDER — INFLUENZA VAC SPLIT QUAD 0.5 ML IM SUSY: 0.5000 mL | PREFILLED_SYRINGE | INTRAMUSCULAR | Status: DC

## 2019-06-01 MED ORDER — POTASSIUM CHLORIDE CRYS ER 20 MEQ PO TBCR
40.0000 meq | EXTENDED_RELEASE_TABLET | ORAL | Status: AC
  Administered 2019-06-01 (×2): 40 meq via ORAL
  Filled 2019-06-01 (×2): qty 2

## 2019-06-01 MED ORDER — NICOTINE 21 MG/24HR TD PT24: 21.0000 mg | MEDICATED_PATCH | TRANSDERMAL | 1 refills | Status: AC

## 2019-06-01 MED ORDER — ENSURE ENLIVE PO LIQD
237.0000 mL | Freq: Two times a day (BID) | ORAL | Status: DC
  Administered 2019-06-01 – 2019-06-04 (×6): 237 mL via ORAL

## 2019-06-01 MED ORDER — SODIUM CHLORIDE 0.9 % IV SOLN
INTRAVENOUS | Status: DC
  Administered 2019-06-01 – 2019-06-04 (×4): via INTRAVENOUS

## 2019-06-01 MED ORDER — MAGNESIUM SULFATE 2 GM/50ML IV SOLN
2.0000 g | Freq: Once | INTRAVENOUS | Status: AC
  Administered 2019-06-01: 2 g via INTRAVENOUS
  Filled 2019-06-01: qty 50

## 2019-06-01 MED ORDER — NICOTINE 21 MG/24HR TD PT24
21.0000 mg | MEDICATED_PATCH | Freq: Every day | TRANSDERMAL | Status: DC
  Administered 2019-06-01 – 2019-06-04 (×4): 21 mg via TRANSDERMAL
  Filled 2019-06-01 (×4): qty 1

## 2019-06-01 MED ORDER — VITAMIN B-1 100 MG PO TABS: 100.0000 mg | ORAL_TABLET | Freq: Every day | ORAL | 0 refills | Status: DC

## 2019-06-01 NOTE — Progress Notes (Signed)
Patient sleeping during shift report. Per offgoing nurse pt received ativan in ED, he has not received additional doses since arriving to [facility]. Pt has slept since arrival to the department.

## 2019-06-01 NOTE — Progress Notes (Signed)
Patient is orthostatic, per assessment with PT.  IVF Bolus is infusing at this time, will re-assess orthostatics upon completion of the bolus.  At that time we will re-asses MEWS and need for more frequent vitals.   Pt denies discomfort at this time, is comfortable in bed eating lunch.

## 2019-06-01 NOTE — Progress Notes (Signed)
Patient is awake, consumed 100% of breakfast independently. Pt is assessed at [place] of 0 at this time.

## 2019-06-01 NOTE — Progress Notes (Signed)
   06/01/19 1329  MEWS Score  Pulse Rate (!) 146  BP (!) 72/62  SpO2 100 %  MEWS Score  MEWS RR 0  MEWS Pulse 3  MEWS Systolic 2  MEWS LOC 0  MEWS Temp 0  MEWS Score 5  MEWS Score Color Red      VS were obtained during orthostatic VS recheck.   Will recheck at rest VS.

## 2019-06-01 NOTE — Progress Notes (Signed)
Patient care resumed from [place] [facility]. Patient resting.

## 2019-06-01 NOTE — Progress Notes (Signed)
   Vital Signs MEWS/VS Documentation       06/01/2019 0521 06/01/2019 0717 06/01/2019 0814 06/01/2019 0900   MEWS Score:  0  0  --  2   MEWS Score Color:  Green  Green  --  Yellow   Resp:  18  16  --  --   Pulse:  96  93  --  --   BP:  109/86  123/86  --  --   Temp:  98.1 F (36.7 C)  97.7 F (36.5 C)  --  --   O2 Device:  Room [employer]  --            [place] N Tobias-Diakun 06/01/2019,9:51 AM

## 2019-06-01 NOTE — Progress Notes (Signed)
TEDs ordered and given to the pateint

## 2019-06-01 NOTE — Progress Notes (Signed)
Pt resting HR with slight trend downward since bolus infusion

## 2019-06-01 NOTE — Progress Notes (Signed)
   06/01/19 1408  MEWS Score  Pulse Rate (!) 108  BP 98/69  Temp 98.3 F (36.8 C)  MEWS Score  MEWS RR 0  MEWS Pulse 1  MEWS Systolic 1  MEWS LOC 0  MEWS Temp 0  MEWS Score 2  MEWS Score Color Yellow    Recheck vitals at rest, pt awakened to obtain these vitals.  Will initiate Q2H VS x 2 x.

## 2019-06-01 NOTE — Progress Notes (Signed)
Patient is sleeping. Has been sleeping since arrival from ED, per off-going nurse.     06/01/19 0730  CIWA-Ar  Nausea and Vomiting 0  Tactile Disturbances 0  Tremor 0  Auditory Disturbances 0  Paroxysmal Sweats 0  Visual Disturbances 0  Anxiety 0  Headache, Fullness in Head 0  Agitation 0  Orientation and Clouding of Sensorium 0  CIWA-Ar Total 0

## 2019-06-01 NOTE — Progress Notes (Addendum)
PROGRESS NOTE    Seth [name]   [id number]  DOB: 18-Apr-1956  DOA: 05/31/2019 PCP: Mack Hook, MD    Subjective: He has chest pain only when he coughs or takes a deep breath.    Brief Summary: Seth [name] a 63 y.o.malewith medical history significant ofEtOH abuse, 1/2 pint vodka daily last drank last night; COPD, HTN.  Presents to ED for 29-month history of gradually worsening intermittent generalized chest pain and shortness of breath with exertion.  Admitted in June for generalized weakness, frequent falls, tachycardia. Placed on folic acid, vitamin 123456, metoprolol,amlodipine. He admits that he is not been compliant with any of his medications.  Decreased appetitie and weight loss.States that he is still falling multiple times at home. Last fall was about 1 week ago. He has been using his rescue inhaler with no improvement in his symptoms. He will have the chest pain or shortness of breath even with walking short distances to his kitchen and bathroom.   Hospital Course:  DOE- nicotine abuse- chest pain - he had a work up in June with a negative CTA and Negative ECHO - he continues to smoke 1 ppd and has developed a worsening cough with central chest pain when he coughs- he is bringing up white sputum - Robitussin DM started for cough - chest pain is clearly pleuritic and needs no further work up - I suspect he has COPD but does not have an official diagnosis -we have discussed smoking cessation- I have ordered Nicotine patches which he wants to try - I have ordered Dulera and am recommending  PFTs as outpt  Orthostatic hypotension - dizzy with standing today and noted to have a drop in BP from 138/88>> 70/60 - given NS boluses to improve this but remains orthostatic- add TEDs and Midodrine today and follow - advised to avoid alcohol (which is contributing to this) and drink more water  Alcohol abuse - He states does not know how to quit-  Social work consulted to give him resources - he has not had any withdrawal symptoms  Hypokalemia and hypomagnesemia - replacing   HTN - cont Lopressor and Amlodipine      Time spent in minutes: 35 DVT prophylaxis: lovenox Code Status: full code Family Communication:  Disposition Plan: f/u on orthostatics Consultants:   none Procedures:    Antimicrobials:  Anti-infectives (From admission, onward)   None       Objective: Vitals:   06/01/19 1329 06/01/19 1408 06/01/19 1620 06/01/19 1635  BP: (!) 72/62 98/69 112/75   Pulse: (!) 146 (!) 108 (!) 104 100  Resp:   18   Temp:  98.3 F (36.8 C) 99 F (37.2 C)   TempSrc:  Oral Oral   SpO2: 100%  99%   Weight:      Height:        Intake/Output Summary (Last 24 hours) at 06/01/2019 1827 Last data filed at 06/01/2019 1802 Gross per 24 hour  Intake 3988.13 ml  Output 625 ml  Net 3363.13 ml   Filed Weights   05/31/19 1235 05/31/19 2200 06/01/19 0521  Weight: 71.7 kg 61.2 kg 61.7 kg    Examination: General exam: Appears comfortable  HEENT: PERRLA, oral mucosa moist, no sclera icterus or thrush Respiratory system: Clear to auscultation. Respiratory effort normal. Cardiovascular system: S1 & S2 heard, RRR.   Gastrointestinal system: Abdomen soft, non-tender, nondistended. Normal bowel sounds. Central nervous system: Alert and oriented. No focal neurological deficits. Extremities: No cyanosis, clubbing  or edema Skin: No rashes or ulcers Psychiatry:  Mood & affect appropriate.     Data Reviewed: I have personally reviewed following labs and imaging studies  CBC: Recent Labs  Lab 05/31/19 1345 06/01/19 0058  WBC 6.3 7.3  NEUTROABS 4.6  --   HGB 11.5* 9.3*  HCT 35.4* 28.7*  MCV 99.7 99.3  PLT 325 [id number]   Basic Metabolic Panel: Recent Labs  Lab 05/31/19 1345 05/31/19 2224 06/01/19 0058  NA 135  --  132*  K 4.3  --  2.9*  CL 92*  --  96*  CO2 19*  --  21*  GLUCOSE 84  --  113*  BUN 11  --  9   CREATININE 1.35*  --  1.18  CALCIUM 10.0  --  8.3*  MG  --  1.5*  --   PHOS  --  2.8  --    GFR: Estimated Creatinine Clearance: 57.4 mL/min (by C-G formula based on SCr of 1.18 mg/dL). Liver Function Tests: Recent Labs  Lab 05/31/19 1345  AST 40  ALT 16  ALKPHOS 146*  BILITOT 1.4*  PROT 7.1  ALBUMIN 3.1*   No results for input(s): LIPASE, AMYLASE in the last 168 hours. No results for input(s): AMMONIA in the last 168 hours. Coagulation Profile: No results for input(s): INR, PROTIME in the last 168 hours. Cardiac Enzymes: No results for input(s): CKTOTAL, CKMB, CKMBINDEX, TROPONINI in the last 168 hours. BNP (last 3 results) No results for input(s): PROBNP in the last 8760 hours. HbA1C: No results for input(s): HGBA1C in the last 72 hours. CBG: No results for input(s): GLUCAP in the last 168 hours. Lipid Profile: No results for input(s): CHOL, HDL, LDLCALC, TRIG, CHOLHDL, LDLDIRECT in the last 72 hours. Thyroid Function Tests: No results for input(s): TSH, T4TOTAL, FREET4, T3FREE, THYROIDAB in the last 72 hours. Anemia Panel: No results for input(s): VITAMINB12, FOLATE, FERRITIN, TIBC, IRON, RETICCTPCT in the last 72 hours. Urine analysis:    Component Value Date/Time   COLORURINE YELLOW 05/31/2019 1827   APPEARANCEUR HAZY (A) 05/31/2019 1827   LABSPEC 1.012 05/31/2019 1827   PHURINE 6.0 05/31/2019 1827   GLUCOSEU NEGATIVE 05/31/2019 1827   HGBUR NEGATIVE 05/31/2019 1827   BILIRUBINUR NEGATIVE 05/31/2019 1827   KETONESUR 20 (A) 05/31/2019 1827   PROTEINUR 30 (A) 05/31/2019 1827   NITRITE NEGATIVE 05/31/2019 1827   LEUKOCYTESUR NEGATIVE 05/31/2019 1827   Sepsis Labs: @LABRCNTIP (procalcitonin:4,lacticidven:4) ) Recent Results (from the past 240 hour(s))  SARS CORONAVIRUS 2 (TAT 6-24 HRS) Nasopharyngeal Nasopharyngeal Swab     Status: None   Collection Time: 05/31/19  8:27 PM   Specimen: Nasopharyngeal Swab  Result Value Ref Range Status   SARS Coronavirus 2  NEGATIVE NEGATIVE Final    Comment: (NOTE) SARS-CoV-2 target nucleic acids are NOT DETECTED. The SARS-CoV-2 RNA is generally detectable in upper and lower respiratory specimens during the acute phase of infection. Negative results do not preclude SARS-CoV-2 infection, do not rule out co-infections with other pathogens, and should not be used as the sole basis for treatment or other patient management decisions. Negative results must be combined with clinical observations, patient history, and epidemiological information. The expected result is Negative. Fact Sheet for Patients: SugarRoll.be Fact Sheet for Healthcare Providers: https://www.woods-mathews.com/ This test is not yet approved or cleared by the [place] FDA and  has been authorized for detection and/or diagnosis of SARS-CoV-2 by FDA under an Emergency Use Authorization (EUA). This EUA will remain  in effect (meaning  this test can be used) for the duration of the COVID-19 declaration under Section 56 4(b)(1) of the Act, 21 U.S.C. section 360bbb-3(b)(1), unless the authorization is terminated or revoked sooner. Performed at [facility], [place] [address], [place], [place] [address]          Radiology Studies: Dg Chest 2 View  Result Date: 05/31/2019 CLINICAL DATA:  Chest pain and dyspnea EXAM: CHEST - 2 VIEW COMPARISON:  02/19/2019 chest radiograph. FINDINGS: Stable cardiomediastinal silhouette with normal heart size. No pneumothorax. Chronic blunting of the right costophrenic angle. No left pleural effusion. Lungs appear clear, with no acute consolidative airspace disease and no pulmonary edema. IMPRESSION: No active cardiopulmonary disease. Chronic blunting of the right costophrenic angle compatible with pleural-parenchymal scarring. Electronically Signed   By: Ilona Sorrel M.D.   On: 05/31/2019 14:20      Scheduled Meds: . enoxaparin (LOVENOX) injection  40 mg  Subcutaneous Q24H  . feeding supplement (ENSURE ENLIVE)  237 mL Oral BID BM  . folic acid  1 mg Oral Daily  . [START ON 06/02/2019] influenza vac split quadrivalent PF  0.5 mL Intramuscular Tomorrow-1000  . midodrine  5 mg Oral BID WC  . multivitamin with minerals  1 tablet Oral Daily  . nicotine  21 mg Transdermal Daily  . thiamine  100 mg Oral Daily   Or  . thiamine  100 mg Intravenous Daily  . umeclidinium bromide  1 puff Inhalation Daily  . vitamin B-12  1,000 mcg Oral Daily   Continuous Infusions: . sodium chloride       LOS: 0 days      Debbe Odea, MD [facility] Pager: [email or web address] Password [facility] 06/01/2019, 6:27 PM

## 2019-06-01 NOTE — Progress Notes (Signed)
   Vital Signs MEWS/VS Documentation      06/01/2019 0004 06/01/2019 0521 06/01/2019 0717 06/01/2019 0814   MEWS Score:  1  0  0  -   MEWS Score Color:  Green  Green  Green  -   Resp:  18  18  16   -   Pulse:  (!) 105  96  93  -   BP:  120/80  109/86  123/86  -   Temp:  98.6 F (37 C)  98.1 F (36.7 C)  97.7 F (36.5 C)  -   O2 Device:  Room [employer]           [place] N Tobias-Diakun 06/01/2019,8:43 AM

## 2019-06-01 NOTE — Progress Notes (Signed)
   Vital Signs MEWS/VS Documentation      06/01/2019 0717 06/01/2019 0814 06/01/2019 0900 06/01/2019 0951   MEWS Score:  0  -  2  1   MEWS Score Color:  Green  -  Yellow  Green   Resp:  16  -  -  -   Pulse:  93  -  -  -   BP:  123/86  -  -  -   Temp:  97.7 F (36.5 C)  -  -  -   O2 Device:  Room [employer]  -  -           Doninique Lwin N Tobias-Diakun 06/01/2019,9:58 AM

## 2019-06-01 NOTE — Evaluation (Signed)
Physical Therapy Evaluation Patient Details Name: Seth [name] MRN: [id number] DOB: 1956-03-28 Today's Date: 06/01/2019   History of Present Illness   63 y.o. male with medical history significant of hypertension, alcohol dependence with peripheral neuropathy, tobacco abuse, COPD came to ED for 52-month history of gradually worsening intermittent generalized chest pain and shortness of breath with exertion. Admitted in June for generalized weakness, frequent falls, tachycardia. Per history, has been non-compliant with meds and that he is still falling multiple times at home.   Clinical Impression   Pt admitted with above diagnosis. When asked to describe his dizziness without using the word dizzy, he states "I get really drowsy feeling, then my vision goes out, then I'm on the floor." Patient with signficant drop in BP and increase in HR with orthostatic BP assessment (see flowsheet) with reports of dizziness. Patient lives alone with 8 steps to enter home. He is agreeable to using a RW, however unable to educate in safe use due to orthostasis. He understands if his falls are related to his BP, that a RW will not prevent his falls. He is however unsteady due to bil neuropathy, therefore [name] could improve his balance. His home is not wheelchair accessible. Pt currently with functional limitations due to the deficits listed below (see PT Problem List). Pt will benefit from skilled PT to increase their independence and safety with mobility to allow discharge to the venue listed below.       Follow Up Recommendations SNF;Supervision/Assistance - 24 hour  *pt wants to go home, however admits he cannot go home in his current condition. If orthostasis improves, may be able to go home with HHPT    Equipment Recommendations  Rolling Teed with 5" wheels(may need wheelchair, however pt cannot use in current home)    Recommendations for Other Services       Precautions / Restrictions  Precautions Precautions: Fall Precaution Comments: pt describes "dizziness" as drowsy feeling comes over him, vision goes blank and he wakes up on the floor Restrictions Weight Bearing Restrictions: No      Mobility  Bed Mobility Overal bed mobility: Modified Independent             General bed mobility comments: incr time and effprt  Transfers Overall transfer level: Modified independent Equipment used: Straight cane             General transfer comment: sit to stand from bed with cane required 2 attempts  Ambulation/Gait Ambulation/Gait assistance: Min guard Gait Distance (Feet): 12 Feet(x 2 to/from toilet) Assistive device: Straight cane Gait Pattern/deviations: Step-to pattern;Decreased stride length;Trunk flexed;Wide base of support(cane in left hand; reaching for counter with RUE)     General Gait Details: very slow and cautious  Stairs            Wheelchair Mobility    Modified Rankin (Stroke Patients Only)       Balance Overall balance assessment: History of Falls                               Standardized Balance Assessment Standardized Balance Assessment : (unable due to orthostasis)           Pertinent Vitals/Pain Pain Assessment: No/denies pain    Home Living Family/patient expects to be discharged to:: Private residence Living Arrangements: Alone Available Help at Discharge: Other (Comment)(only elderly parents near by) Type of Home: House Home Access: Stairs to enter Entrance Stairs-Rails: Right;Left;Can  reach both Entrance Stairs-Number of Steps: 8 Home Layout: One level Home Equipment: Cane - single point      Prior Function Level of Independence: Independent with assistive device(s)         Comments: 5 falls in the past month; reports feels dizzy and then legs give out and he falls     Hand Dominance   Dominant Hand: Left    Extremity/Trunk Assessment   Upper Extremity Assessment Upper  Extremity Assessment: Generalized weakness    Lower Extremity Assessment Lower Extremity Assessment: RLE deficits/detail;LLE deficits/detail RLE Sensation: history of peripheral neuropathy(feet only per pt) LLE Sensation: history of peripheral neuropathy(feet only per pt)    Cervical / Trunk Assessment Cervical / Trunk Assessment: Kyphotic  Communication   Communication: No difficulties  Cognition Arousal/Alertness: Awake/alert Behavior During Therapy: Flat affect Overall Cognitive Status: No family/caregiver present to determine baseline cognitive functioning Area of Impairment: Safety/judgement;Awareness                         Safety/Judgement: Decreased awareness of safety;Decreased awareness of deficits Awareness: Intellectual          General Comments General comments (skin integrity, edema, etc.): Pt reports he never got a RW when hospitalized last time (reviewed chart and PT recommended, however was not ordered prior to discharge. Pt states he did not refuse to get one). If falls are due to fainting, [name] will not prevent these falls from happening and pt verbalizes understanding. Still feel he can benefti from RW due to neuropathy.     Exercises     Assessment/Plan    PT Assessment Patient needs continued PT services  PT Problem List Decreased strength;Decreased range of motion;Decreased activity tolerance;Decreased balance;Decreased mobility;Decreased knowledge of use of DME;Cardiopulmonary status limiting activity;Impaired sensation       PT Treatment Interventions DME instruction;Gait training;Functional mobility training;Therapeutic activities;Balance training;Neuromuscular re-education;Patient/family education    PT Goals (Current goals can be found in the Care Plan section)  Acute Rehab PT Goals Patient Stated Goal: to stop falling PT Goal Formulation: With patient Time For Goal Achievement: 06/15/19 Potential to Achieve Goals: Fair     Frequency Min 3X/week   Barriers to discharge Inaccessible home environment;Decreased caregiver support 8 steps to enter; no caregiver support    Co-evaluation               AM-PAC PT "6 Clicks" Mobility  Outcome Measure Help needed turning from your back to your side while in a flat bed without using bedrails?: None Help needed moving from lying on your back to sitting on the side of a flat bed without using bedrails?: None Help needed moving to and from a bed to a chair (including a wheelchair)?: A Little Help needed standing up from a chair using your arms (e.g., wheelchair or bedside chair)?: None Help needed to walk in hospital room?: A Little Help needed climbing 3-5 steps with a railing? : A Little 6 Click Score: 21    End of Session Equipment Utilized During Treatment: Gait belt Activity Tolerance: Treatment limited secondary to medical complications (Comment)(decr BP and elevated HR) Patient left: in bed;with call bell/phone within reach;with bed alarm set Nurse Communication: Mobility status;Other (comment)(orthostasis) PT Visit Diagnosis: Unsteadiness on feet (R26.81);Muscle weakness (generalized) (M62.81);History of falling (Z91.81);Difficulty in walking, not elsewhere classified (R26.2)    Time: [id number] PT Time Calculation (min) (ACUTE ONLY): 46 min   Charges:   PT Evaluation $PT Eval Moderate Complexity: 1 Mod  PT Treatments $Gait Training: 8-22 mins          Barry Brunner, PT      Rexanne Mano 06/01/2019, 11:30 AM

## 2019-06-02 LAB — MAGNESIUM: Magnesium: 1.6 mg/dL — ABNORMAL LOW (ref 1.7–2.4)

## 2019-06-02 LAB — CORTISOL: Cortisol, Plasma: 13 ug/dL

## 2019-06-02 MED ORDER — MIDODRINE HCL 5 MG PO TABS
10.0000 mg | ORAL_TABLET | Freq: Three times a day (TID) | ORAL | Status: DC
  Administered 2019-06-02 – 2019-06-04 (×7): 10 mg via ORAL
  Filled 2019-06-02 (×7): qty 2

## 2019-06-02 MED ORDER — INFLUENZA VAC SPLIT QUAD 0.5 ML IM SUSY
0.5000 mL | PREFILLED_SYRINGE | INTRAMUSCULAR | Status: AC
  Administered 2019-06-03: 11:00:00 0.5 mL via INTRAMUSCULAR
  Filled 2019-06-02: qty 0.5

## 2019-06-02 MED ORDER — POTASSIUM CHLORIDE CRYS ER 20 MEQ PO TBCR
40.0000 meq | EXTENDED_RELEASE_TABLET | Freq: Once | ORAL | Status: AC
  Administered 2019-06-02: 09:00:00 40 meq via ORAL
  Filled 2019-06-02: qty 2

## 2019-06-02 MED ORDER — MAGNESIUM SULFATE 4 GM/100ML IV SOLN
4.0000 g | Freq: Once | INTRAVENOUS | Status: AC
  Administered 2019-06-02: 4 g via INTRAVENOUS
  Filled 2019-06-02: qty 100

## 2019-06-02 NOTE — Progress Notes (Signed)
   06/02/19 1039  Orthostatic Lying   BP- Lying 127/74  Pulse- Lying 92  Orthostatic Sitting  BP- Sitting 105/67  Pulse- Sitting 104  Orthostatic Standing at 0 minutes  BP- Standing at 0 minutes 98/63  Pulse- Standing at 0 minutes 136  Orthostatic Standing at 3 minutes  BP- Standing at 3 minutes  (patient unable to remain standing )  Pulse- Standing at 3 minutes  (patient unable to remain standing)

## 2019-06-02 NOTE — Progress Notes (Signed)
Ortho: lying 121/79, 97            Sitting 110/73, 103            Standing 78/56, 130 Not able to stand for 3 minutes, pt got dizzy, after getting back to bed the dizziness stopped, Thanks Buckner [place].

## 2019-06-02 NOTE — Progress Notes (Addendum)
PROGRESS NOTE    Seth [name]   [id number]  DOB: 1955-12-03  DOA: 05/31/2019 PCP: Mack Hook, MD      Brief Summary: Seth [name] a 63 y.o.malewith medical history significant ofEtOH abuse, 1/2 pint vodka daily last drank last night; COPD, HTN.  Presents to ED for 2-month history of gradually worsening intermittent generalized chest pain and shortness of breath with exertion.  Admitted in June for generalized weakness, frequent falls, tachycardia. Placed on folic acid, vitamin 123456, metoprolol,amlodipine. He admits that he is not been compliant with any of his medications.  Decreased appetitie and weight loss.States that he is still falling multiple times at home. Last fall was about 1 week ago. He has been using his rescue inhaler with no improvement in his symptoms. He will have the chest pain or shortness of breath even with walking short distances to his kitchen and bathroom.  Subjective: He has chest pain only when he coughs or takes a deep breath.   Hospital Course:  DOE- nicotine abuse- chest pain - he had a work up in June with a negative CTA and Negative ECHO - he continues to smoke 1 ppd and has developed a worsening cough with central chest pain when he coughs- he is bringing up white sputum - Robitussin DM started for cough - chest pain is clearly pleuritic and needs no further work up - I suspect he has COPD but does not have an official diagnosis -we have discussed smoking cessation- I have ordered Nicotine patches which he wants to try - I have ordered Dulera and am recommending  PFTs as outpt  Orthostatic hypotension - still dizzy with standing today despite adequate hydration, TEDs and Midodrine and BP still dropping significantly with rising HR - advised to avoid alcohol (which is contributing to this) and drink more water - checking Cortisol today and increasing Midodrine- cont IVF  Alcohol abuse - He states does not  know how to quit- Social work consulted to give him resources - he has not had any withdrawal symptoms  Hypokalemia and hypomagnesemia - replacing again today  HTN - cont Lopressor and Amlodipine   Time spent in minutes: 35 DVT prophylaxis: lovenox Code Status: full code Family Communication:  Disposition Plan: f/u on orthostatics Consultants:   none Procedures:    Antimicrobials:  Anti-infectives (From admission, onward)   None       Objective: Vitals:   06/02/19 0524 06/02/19 0805 06/02/19 0815 06/02/19 1000  BP: 122/82  112/66 127/74  Pulse: 92  (!) 102 92  Resp: 18  15   Temp: 98.8 F (37.1 C)  98.9 F (37.2 C)   TempSrc: Oral  Oral   SpO2: 100% 100% 100%   Weight: 66.7 kg     Height:        Intake/Output Summary (Last 24 hours) at 06/02/2019 1133 Last data filed at 06/02/2019 1000 Gross per 24 hour  Intake 3070.21 ml  Output 1250 ml  Net 1820.21 ml   Filed Weights   05/31/19 2200 06/01/19 0521 06/02/19 0524  Weight: 61.2 kg 61.7 kg 66.7 kg    Examination: General exam: Appears comfortable  HEENT: PERRLA, oral mucosa moist, no sclera icterus or thrush Respiratory system: Clear to auscultation. Respiratory effort normal. Cardiovascular system: S1 & S2 heard,  No murmurs  Gastrointestinal system: Abdomen soft, non-tender, nondistended. Normal bowel sounds   Central nervous system: Alert and oriented. No focal neurological deficits. Extremities: No cyanosis, clubbing or edema Skin: No rashes  or ulcers Psychiatry:  Mood & affect appropriate.     Data Reviewed: I have personally reviewed following labs and imaging studies  CBC: Recent Labs  Lab 05/31/19 1345 06/01/19 0058  WBC 6.3 7.3  NEUTROABS 4.6  --   HGB 11.5* 9.3*  HCT 35.4* 28.7*  MCV 99.7 99.3  PLT 325 [id number]   Basic Metabolic Panel: Recent Labs  Lab 05/31/19 1345 05/31/19 2224 06/01/19 0058 06/02/19 0539  NA 135  --  132*  --   K 4.3  --  2.9*  --   CL 92*  --  96*  --    CO2 19*  --  21*  --   GLUCOSE 84  --  113*  --   BUN 11  --  9  --   CREATININE 1.35*  --  1.18  --   CALCIUM 10.0  --  8.3*  --   MG  --  1.5*  --  1.6*  PHOS  --  2.8  --   --    GFR: Estimated Creatinine Clearance: 62 mL/min (by C-G formula based on SCr of 1.18 mg/dL). Liver Function Tests: Recent Labs  Lab 05/31/19 1345  AST 40  ALT 16  ALKPHOS 146*  BILITOT 1.4*  PROT 7.1  ALBUMIN 3.1*   No results for input(s): LIPASE, AMYLASE in the last 168 hours. No results for input(s): AMMONIA in the last 168 hours. Coagulation Profile: No results for input(s): INR, PROTIME in the last 168 hours. Cardiac Enzymes: No results for input(s): CKTOTAL, CKMB, CKMBINDEX, TROPONINI in the last 168 hours. BNP (last 3 results) No results for input(s): PROBNP in the last 8760 hours. HbA1C: No results for input(s): HGBA1C in the last 72 hours. CBG: No results for input(s): GLUCAP in the last 168 hours. Lipid Profile: No results for input(s): CHOL, HDL, LDLCALC, TRIG, CHOLHDL, LDLDIRECT in the last 72 hours. Thyroid Function Tests: No results for input(s): TSH, T4TOTAL, FREET4, T3FREE, THYROIDAB in the last 72 hours. Anemia Panel: No results for input(s): VITAMINB12, FOLATE, FERRITIN, TIBC, IRON, RETICCTPCT in the last 72 hours. Urine analysis:    Component Value Date/Time   COLORURINE YELLOW 05/31/2019 1827   APPEARANCEUR HAZY (A) 05/31/2019 1827   LABSPEC 1.012 05/31/2019 1827   PHURINE 6.0 05/31/2019 1827   GLUCOSEU NEGATIVE 05/31/2019 1827   HGBUR NEGATIVE 05/31/2019 1827   BILIRUBINUR NEGATIVE 05/31/2019 1827   KETONESUR 20 (A) 05/31/2019 1827   PROTEINUR 30 (A) 05/31/2019 1827   NITRITE NEGATIVE 05/31/2019 1827   LEUKOCYTESUR NEGATIVE 05/31/2019 1827   Sepsis Labs: @LABRCNTIP (procalcitonin:4,lacticidven:4) ) Recent Results (from the past 240 hour(s))  SARS CORONAVIRUS 2 (TAT 6-24 HRS) Nasopharyngeal Nasopharyngeal Swab     Status: None   Collection Time: 05/31/19  8:27  PM   Specimen: Nasopharyngeal Swab  Result Value Ref Range Status   SARS Coronavirus 2 NEGATIVE NEGATIVE Final    Comment: (NOTE) SARS-CoV-2 target nucleic acids are NOT DETECTED. The SARS-CoV-2 RNA is generally detectable in upper and lower respiratory specimens during the acute phase of infection. Negative results do not preclude SARS-CoV-2 infection, do not rule out co-infections with other pathogens, and should not be used as the sole basis for treatment or other patient management decisions. Negative results must be combined with clinical observations, patient history, and epidemiological information. The expected result is Negative. Fact Sheet for Patients: SugarRoll.be Fact Sheet for Healthcare Providers: https://www.woods-mathews.com/ This test is not yet approved or cleared by the [place] FDA and  has been authorized for detection and/or diagnosis of SARS-CoV-2 by FDA under an Emergency Use Authorization (EUA). This EUA will remain  in effect (meaning this test can be used) for the duration of the COVID-19 declaration under Section 56 4(b)(1) of the Act, 21 U.S.C. section 360bbb-3(b)(1), unless the authorization is terminated or revoked sooner. Performed at [facility], [place] [address] [place], [place] [address]          Radiology Studies: Dg Chest 2 View  Result Date: 05/31/2019 CLINICAL DATA:  Chest pain and dyspnea EXAM: CHEST - 2 VIEW COMPARISON:  02/19/2019 chest radiograph. FINDINGS: Stable cardiomediastinal silhouette with normal heart size. No pneumothorax. Chronic blunting of the right costophrenic angle. No left pleural effusion. Lungs appear clear, with no acute consolidative airspace disease and no pulmonary edema. IMPRESSION: No active cardiopulmonary disease. Chronic blunting of the right costophrenic angle compatible with pleural-parenchymal scarring. Electronically Signed   By: Ilona Sorrel M.D.   On:  05/31/2019 14:20      Scheduled Meds: . enoxaparin (LOVENOX) injection  40 mg Subcutaneous Q24H  . feeding supplement (ENSURE ENLIVE)  237 mL Oral BID BM  . folic acid  1 mg Oral Daily  . [START ON 06/03/2019] influenza vac split quadrivalent PF  0.5 mL Intramuscular Tomorrow-1000  . midodrine  10 mg Oral TID WC  . mometasone-formoterol  2 puff Inhalation BID  . multivitamin with minerals  1 tablet Oral Daily  . nicotine  21 mg Transdermal Daily  . thiamine  100 mg Oral Daily   Or  . thiamine  100 mg Intravenous Daily  . umeclidinium bromide  1 puff Inhalation Daily  . vitamin B-12  1,000 mcg Oral Daily   Continuous Infusions: . sodium chloride 75 mL/hr at 06/01/19 1935     LOS: 1 day      Debbe Odea, MD [facility] Pager: [email or web address] Password [email or web address] 06/02/2019, 11:33 AM

## 2019-06-02 NOTE — Progress Notes (Signed)
Initial Nutrition Assessment  DOCUMENTATION CODES:   Non-severe (moderate) malnutrition in context of chronic illness  INTERVENTION:   -Magic cup BID with meals, each supplement provides 290 kcal and 9 grams of protein -Continue Ensure Enlive po BID, each supplement provides 350 kcal and 20 grams of protein -Continue MVI with minerals daily  NUTRITION DIAGNOSIS:   Moderate Malnutrition related to chronic illness(COPD, ETOH abuse) as evidenced by energy intake < or equal to 75% for > or equal to 1 month, mild fat depletion, moderate fat depletion, mild muscle depletion, moderate muscle depletion.  GOAL:   Patient will meet greater than or equal to 90% of their needs  MONITOR:   PO intake, Supplement acceptance, Labs, Weight trends, Skin, I & O's  REASON FOR ASSESSMENT:   Malnutrition Screening Tool    ASSESSMENT:   Seth [name] is a 63 y.o. male with medical history significant of EtOH abuse, 1/2 pint vodka daily last drank last night; COPD, HTN.Presents to ED for 59-month history of gradually worsening intermittent generalized chest pain and shortness of breath with exertion.  Pt admitted with dyspnea on exertion for the past 6 months.   Reviewed I/O's: +2.5 L x 24 hours and +3.5 L since admission  UOP: 1.3 L x 24 hours  Spoke with pt at bedside, who was pleasant and in good spirits today. RD assisted pt with lunch tray set-up. He complains of a general decline in health over the past 6 months, consisting of worsening periods of dizziness and orthostasis. It has been increasingly difficult for him to ambulate around his home.   Pt endorses decreased oral intake since this time period. He reports he will often go periods of 3 days without eating (intake will consist only of juice, soda, and 1/2 pint of vodka). When he does eat solid food, he will consume 2 meals per day (Breakfast of cereal and milk and dinner of sandwich and water).   Pt shares that his UBW is around  181#. He estimates he has lost approximately 40 pounds over the past 6 months. Unfortunately, there is no data available to confirm this, however, RD did notice history of distant wt loss. Pt has experienced a 2.1% wt loss over the past 3 months, per documented weight history, which is not significant for time frame.   Pt reports significantly improved appetite since admission. He has been consuming most of his meals (documented meal completion 80-100%). Pt has also been consuming Ensure supplements provided, which he likes. Discussed importance of good meal and supplement intake to promote healing.   Medications reviewed and include vitamin B-12, vitamin B-1, MVI, folvite, and 0.9% sodium chloride infusion at 75 ml/hr.   Labs reviewed: Na: 132, K: 2.9, Mg: 1.5.   NUTRITION - FOCUSED PHYSICAL EXAM:    Most Recent Value  Orbital Region  Moderate depletion  Upper Arm Region  Moderate depletion  Thoracic and Lumbar Region  Mild depletion  Buccal Region  Mild depletion  Temple Region  Moderate depletion  Clavicle Bone Region  Moderate depletion  Clavicle and Acromion Bone Region  Mild depletion  Scapular Bone Region  Mild depletion  Dorsal Hand  Moderate depletion  Patellar Region  Moderate depletion  Anterior Thigh Region  Moderate depletion  Posterior Calf Region  Moderate depletion  Edema (RD Assessment)  None  Hair  Reviewed  Eyes  Reviewed  Mouth  Reviewed  Skin  Reviewed  Nails  Reviewed       Diet Order:  Diet Order            Diet - low sodium heart healthy        Diet Heart Room service appropriate? Yes; Fluid consistency: Thin  Diet effective now              EDUCATION NEEDS:   Education needs have been addressed  Skin:  Skin Assessment: Reviewed RN Assessment  Last BM:  05/29/19  Height:   Ht Readings from Last 1 Encounters:  05/31/19 6\' 2"  (1.88 m)    Weight:   Wt Readings from Last 1 Encounters:  06/02/19 66.7 kg    Ideal Body Weight:  86.4  kg  BMI:  Body mass index is 18.87 kg/m.  Estimated Nutritional Needs:   Kcal:  2100-2300  Protein:  115-130 grams  Fluid:  > 2.1 L    Seth [name], RD, LDN, [place] Registered Dietitian II Certified Diabetes Care and Education Specialist Pager: [phone] After hours Pager: [phone]

## 2019-06-02 NOTE — Plan of Care (Signed)
  Problem: Pain Managment: Goal: General experience of comfort will improve Outcome: Completed/Met   Problem: Skin Integrity: Goal: Risk for impaired skin integrity will decrease Outcome: Completed/Met

## 2019-06-02 NOTE — TOC Progression Note (Signed)
Transition of Care [facility]) - Progression Note    Patient Details  Name: Seth [name] MRN: [id number] Date of Birth: 05/08/1956  [facility] CM/SW Contact  Zenon Mayo, RN Phone Number: 06/02/2019, 6:23 PM  Clinical Narrative:    NCM received consult regarding patient having trouble with getting transportation to the pharmacy, NCM will look into getting him se up with a pharmacy that  Will deliver meds to him.        Expected Discharge Plan and Services           Expected Discharge Date: 06/01/19                                     Social Determinants of Health (SDOH) Interventions    Readmission Risk Interventions No flowsheet data found.

## 2019-06-03 LAB — BASIC METABOLIC PANEL
Anion gap: 10 (ref 5–15)
BUN: 6 mg/dL — ABNORMAL LOW (ref 8–23)
CO2: 22 mmol/L (ref 22–32)
Calcium: 8.6 mg/dL — ABNORMAL LOW (ref 8.9–10.3)
Chloride: 105 mmol/L (ref 98–111)
Creatinine, Ser: 0.86 mg/dL (ref 0.61–1.24)
GFR calc Af Amer: >60 mL/min (ref >60)
GFR calc non Af Amer: >60 mL/min (ref >60)
Glucose, Bld: 87 mg/dL (ref 70–99)
Potassium: 4.6 mmol/L (ref 3.5–5.1)
Sodium: 137 mmol/L (ref 135–145)

## 2019-06-03 LAB — MAGNESIUM: Magnesium: 1.8 mg/dL (ref 1.7–2.4)

## 2019-06-03 LAB — VITAMIN B12: Vitamin B-12: 478 pg/mL (ref 180–914)

## 2019-06-03 MED ORDER — MAGNESIUM SULFATE 2 GM/50ML IV SOLN
2.0000 g | Freq: Once | INTRAVENOUS | Status: AC
  Administered 2019-06-03: 2 g via INTRAVENOUS
  Filled 2019-06-03: qty 50

## 2019-06-03 MED ORDER — METOPROLOL TARTRATE 25 MG PO TABS
25.0000 mg | ORAL_TABLET | Freq: Two times a day (BID) | ORAL | Status: DC
  Administered 2019-06-03 – 2019-06-04 (×3): 25 mg via ORAL
  Filled 2019-06-03 (×3): qty 1

## 2019-06-03 NOTE — Progress Notes (Signed)
Physical Therapy Treatment Patient Details Name: Seth [name] MRN: [id number] DOB: April 03, 1956 Today's Date: 06/03/2019    History of Present Illness  63 y.o. male with medical history significant of hypertension, alcohol dependence with peripheral neuropathy, tobacco abuse, COPD came to ED for 53-month history of gradually worsening intermittent generalized chest pain and shortness of breath with exertion. Admitted in June for generalized weakness, frequent falls, tachycardia. Per history, has been non-compliant with meds and that he is still falling multiple times at home.     PT Comments    Patient seen for mobility progression. Pt requires min guard/min A for all mobility this session. Pt with c/o dizziness with initial postural changes. Pt reports dizziness resolves within a few minutes of postural change and with no c/o dizziness while ambulating short distance in room. Pt tolerated gait training distance of 20 ft then 25 ft (rest break to check BP again). Continue to progress as tolerated.   BP in supine 151/87 (106) pulse 89 BP in sitting 134/890(102) pulse 93 BP in standing 80/68 (74) pulse 119 BP after ambulating 20 ft 132/96 (108) pulse 98  SpO2 100% on RA   Follow Up Recommendations  SNF;Supervision/Assistance - 24 hour     Equipment Recommendations  Rolling Selvey with 5" wheels(may need wheelchair, however pt cannot use in current home)    Recommendations for Other Services       Precautions / Restrictions Precautions Precautions: Fall Precaution Comments: pt describes "dizziness" as drowsy feeling comes over him, vision goes blank and he wakes up on the floor Restrictions Weight Bearing Restrictions: No    Mobility  Bed Mobility Overal bed mobility: Modified Independent             General bed mobility comments: use of rail and increased time  Transfers Overall transfer level: Modified independent Equipment used: Rolling Tarte (2 wheeled) Transfers:  Sit to/from Stand Sit to Stand: Min guard;Min assist         General transfer comment: pt stood from EOB and recliner with min A to power up from recliner (lower surface); cues for positioning prior to standing   Ambulation/Gait Ambulation/Gait assistance: Min guard Gait Distance (Feet): (20 ft then 25 ft) Assistive device: Rolling Fiebig (2 wheeled) Gait Pattern/deviations: Trunk flexed;Decreased stride length;Step-through pattern Gait velocity: decreased   General Gait Details: cues for upright posture and maintaining safe proximity to RW especially when turning   Stairs             Wheelchair Mobility    Modified Rankin (Stroke Patients Only)       Balance Overall balance assessment: History of Falls                                          Cognition Arousal/Alertness: Awake/alert Behavior During Therapy: WFL for tasks assessed/performed;Flat affect Overall Cognitive Status: No family/caregiver present to determine baseline cognitive functioning Area of Impairment: Safety/judgement;Awareness                         Safety/Judgement: Decreased awareness of safety;Decreased awareness of deficits Awareness: Intellectual   General Comments: a little slow to process      Exercises      General Comments        Pertinent Vitals/Pain Pain Assessment: No/denies pain    Home Living  Prior Function            PT Goals (current goals can now be found in the care plan section) Acute Rehab PT Goals Patient Stated Goal: to stop falling Progress towards PT goals: Progressing toward goals    Frequency    Min 3X/week      PT Plan Current plan remains appropriate    Co-evaluation              AM-PAC PT "6 Clicks" Mobility   Outcome Measure  Help needed turning from your back to your side while in a flat bed without using bedrails?: None Help needed moving from lying on your back to  sitting on the side of a flat bed without using bedrails?: None Help needed moving to and from a bed to a chair (including a wheelchair)?: A Little Help needed standing up from a chair using your arms (e.g., wheelchair or bedside chair)?: None Help needed to walk in hospital room?: A Little Help needed climbing 3-5 steps with a railing? : A Little 6 Click Score: 21    End of Session Equipment Utilized During Treatment: Gait belt Activity Tolerance: Patient tolerated treatment well Patient left: in chair;with call bell/phone within reach;with chair alarm set Nurse Communication: Mobility status PT Visit Diagnosis: Unsteadiness on feet (R26.81);Muscle weakness (generalized) (M62.81);History of falling (Z91.81);Difficulty in walking, not elsewhere classified (R26.2)     Time: 1520-1550 PT Time Calculation (min) (ACUTE ONLY): 30 min  Charges:  $Gait Training: 23-37 mins                     Earney Navy, PTA Acute Rehabilitation Services Pager: [phone] Office: [phone]     Darliss Cheney 06/03/2019, 4:49 PM

## 2019-06-03 NOTE — Progress Notes (Signed)
PROGRESS NOTE    Seth [name]   [id number]  DOB: 1956/02/07  DOA: 05/31/2019 PCP: Mack Hook, MD      Brief Summary: [name] a 63 y.o.malewith medical history significant ofEtOH abuse, 1/2 pint vodka daily last drank last night; COPD, HTN.  Presents to ED for 31-month history of gradually worsening intermittent generalized chest pain and shortness of breath with exertion.  Admitted in June for generalized weakness, frequent falls, tachycardia. Placed on folic acid, vitamin 123456, metoprolol,amlodipine. He admits that he is not been compliant with any of his medications. Slow to improve orthostatics  Subjective: Appetite good, had not been up yet today  Hospital Course:  DOE- nicotine abuse- chest pain - he had a work up in June with a negative CTA and Negative ECHO - he continues to smoke 1 ppd and has developed a worsening cough with central chest pain when he coughs- he is bringing up white sputum - Robitussin DM started for cough - chest pain is clearly pleuritic and needs no further work up - suspect he has COPD but does not have an official diagnosis-- outpatient PFTs   Orthostatic hypotension - less dizzy with standing today despite adequate hydration, TEDs and Midodrine and BP still dropping significantly with rising HR - advised to avoid alcohol (which is contributing to this) and drink more water -  Cortisol checked on 10/5 and increased Midodrine-- improvement in symptoms on 10/5 -check B12  Alcohol abuse - He states does not know how to quit- Social work consulted to give him resources - he has not had any withdrawal symptoms  Hypokalemia and hypomagnesemia -repleted  HTN - restart Lopressor  Today    Time spent in minutes:  15 min DVT prophylaxis: lovenox Code Status: full code Family Communication:  Disposition Plan: ambulate QID today and recheck orthos in AM-- home if improved    Objective: Vitals:   06/03/19 0127 06/03/19 0508 06/03/19 0623 06/03/19 0855  BP: (!) 141/86 134/88 (!) 141/87   Pulse: 91 86 90   Resp: 19 16    Temp: 98.4 F (36.9 C) 98.8 F (37.1 C)    TempSrc: Oral Oral    SpO2: 99% 98%  100%  Weight:  59.9 kg    Height:        Intake/Output Summary (Last 24 hours) at 06/03/2019 1139 Last data filed at 06/03/2019 1000 Gross per 24 hour  Intake 186.9 ml  Output 1150 ml  Net -963.1 ml   Filed Weights   06/01/19 0521 06/02/19 0524 06/03/19 0508  Weight: 61.7 kg 66.7 kg 59.9 kg    Examination: General exam: in bed Respiratory system: clear, normal effort. Cardiovascular system: rrr Gastrointestinal system: +Bs, soft Central nervous system: alert    Data Reviewed: I have personally reviewed following labs and imaging studies  CBC: Recent Labs  Lab 05/31/19 1345 06/01/19 0058  WBC 6.3 7.3  NEUTROABS 4.6  --   HGB 11.5* 9.3*  HCT 35.4* 28.7*  MCV 99.7 99.3  PLT 325 [id number]   Basic Metabolic Panel: Recent Labs  Lab 05/31/19 1345 05/31/19 2224 06/01/19 0058 06/02/19 0539 06/03/19 0514  NA 135  --  132*  --  137  K 4.3  --  2.9*  --  4.6  CL 92*  --  96*  --  105  CO2 19*  --  21*  --  22  GLUCOSE 84  --  113*  --  87  BUN 11  --  9  --  6*  CREATININE 1.35*  --  1.18  --  0.86  CALCIUM 10.0  --  8.3*  --  8.6*  MG  --  1.5*  --  1.6* 1.8  PHOS  --  2.8  --   --   --    GFR: Estimated Creatinine Clearance: 76.4 mL/min (by C-G formula based on SCr of 0.86 mg/dL). Liver Function Tests: Recent Labs  Lab 05/31/19 1345  AST 40  ALT 16  ALKPHOS 146*  BILITOT 1.4*  PROT 7.1  ALBUMIN 3.1*   No results for input(s): LIPASE, AMYLASE in the last 168 hours. No results for input(s): AMMONIA in the last 168 hours. Coagulation Profile: No results for input(s): INR, PROTIME in the last 168 hours. Cardiac Enzymes: No results for input(s): CKTOTAL, CKMB, CKMBINDEX, TROPONINI in the last 168 hours. BNP (last 3 results) No results for input(s):  PROBNP in the last 8760 hours. HbA1C: No results for input(s): HGBA1C in the last 72 hours. CBG: No results for input(s): GLUCAP in the last 168 hours. Lipid Profile: No results for input(s): CHOL, HDL, LDLCALC, TRIG, CHOLHDL, LDLDIRECT in the last 72 hours. Thyroid Function Tests: No results for input(s): TSH, T4TOTAL, FREET4, T3FREE, THYROIDAB in the last 72 hours. Anemia Panel: No results for input(s): VITAMINB12, FOLATE, FERRITIN, TIBC, IRON, RETICCTPCT in the last 72 hours. Urine analysis:    Component Value Date/Time   COLORURINE YELLOW 05/31/2019 1827   APPEARANCEUR HAZY (A) 05/31/2019 1827   LABSPEC 1.012 05/31/2019 1827   PHURINE 6.0 05/31/2019 1827   GLUCOSEU NEGATIVE 05/31/2019 1827   HGBUR NEGATIVE 05/31/2019 1827   BILIRUBINUR NEGATIVE 05/31/2019 1827   KETONESUR 20 (A) 05/31/2019 1827   PROTEINUR 30 (A) 05/31/2019 1827   NITRITE NEGATIVE 05/31/2019 1827   LEUKOCYTESUR NEGATIVE 05/31/2019 1827    Recent Results (from the past 240 hour(s))  SARS CORONAVIRUS 2 (TAT 6-24 HRS) Nasopharyngeal Nasopharyngeal Swab     Status: None   Collection Time: 05/31/19  8:27 PM   Specimen: Nasopharyngeal Swab  Result Value Ref Range Status   SARS Coronavirus 2 NEGATIVE NEGATIVE Final    Comment: (NOTE) SARS-CoV-2 target nucleic acids are NOT DETECTED. The SARS-CoV-2 RNA is generally detectable in upper and lower respiratory specimens during the acute phase of infection. Negative results do not preclude SARS-CoV-2 infection, do not rule out co-infections with other pathogens, and should not be used as the sole basis for treatment or other patient management decisions. Negative results must be combined with clinical observations, patient history, and epidemiological information. The expected result is Negative. Fact Sheet for Patients: SugarRoll.be Fact Sheet for Healthcare Providers: https://www.woods-mathews.com/ This test is not yet  approved or cleared by the [place] FDA and  has been authorized for detection and/or diagnosis of SARS-CoV-2 by FDA under an Emergency Use Authorization (EUA). This EUA will remain  in effect (meaning this test can be used) for the duration of the COVID-19 declaration under Section 56 4(b)(1) of the Act, 21 U.S.C. section 360bbb-3(b)(1), unless the authorization is terminated or revoked sooner. Performed at [facility], [place] [address] [place], [place] [address]          Radiology Studies: No results found.    Scheduled Meds: . enoxaparin (LOVENOX) injection  40 mg Subcutaneous Q24H  . feeding supplement (ENSURE ENLIVE)  237 mL Oral BID BM  . folic acid  1 mg Oral Daily  . midodrine  10 mg Oral TID WC  .  mometasone-formoterol  2 puff Inhalation BID  . multivitamin with minerals  1 tablet Oral Daily  . nicotine  21 mg Transdermal Daily  . thiamine  100 mg Oral Daily   Or  . thiamine  100 mg Intravenous Daily  . umeclidinium bromide  1 puff Inhalation Daily  . vitamin B-12  1,000 mcg Oral Daily   Continuous Infusions: . sodium chloride 75 mL/hr at 06/03/19 0552     LOS: 2 days      Geradine Girt, DO [facility]  [email or web address] Password [email or web address] 06/03/2019, 11:39 AM

## 2019-06-04 ENCOUNTER — Ambulatory Visit: Payer: Self-pay | Admitting: Internal Medicine

## 2019-06-04 DIAGNOSIS — R079 Chest pain, unspecified: Secondary | ICD-10-CM

## 2019-06-04 MED ORDER — MIDODRINE HCL 10 MG PO TABS: 10.0000 mg | ORAL_TABLET | Freq: Three times a day (TID) | ORAL | 0 refills | Status: DC

## 2019-06-04 NOTE — Progress Notes (Signed)
Patient is alert and oriented, vital signs are stable pt with improved dizziness, iv removed and discharge instructions reviewed with patient, patient to follow up with primary care provider for a follow up cbc and CMP, questions and concerns answered Neta Mends RN 2:22 PM 06-04-2019

## 2019-06-04 NOTE — TOC Transition Note (Addendum)
Transition of Care [facility]) - CM/SW Discharge Note Marvetta Gibbons RN,BSN Transitions of Care Unit 3E cross coverage- RN Case Manager [phone]   Patient Details  Name: Seth [name] MRN: [id number] Date of Birth: [birth year]  Transition of Care [facility]) CM/SW Contact:  Dawayne Patricia, RN Phone Number: 06/04/2019, 1:22 PM   Clinical Narrative:    Pt stable for transition home today, HHPT/OT orders and DME RW placed. Pt also requesting info on pharmacies that deliver. CM spoke with pt at bedside- list provided to pt Per CMS guidelines from medicare.gov website with star ratings (copy placed in shadow chart) for [facility] choice- pt requesting to speak with family regarding HH- CM will f/u with pt later this afternoon for [facility] needs and make referral based on pt choice. Call made to [facility] with [place] for RW need - RW to be delivered to room prior to discharge. Pt currently stating that he wants to remain with [facility] for his medication needs- list provided to pt for pharmacies that deliver to home should he change his mind. Address, phone # and PCP confirmed with pt in epic. Pt waiting on ride to get here for transport.  1530- attempted to call pt to f/u on [facility] needs- no answer- msg left with return # to call this CM for follow up   Final next level of care: [place] Barriers to Discharge: No Barriers Identified   Patient Goals and CMS Choice Patient states their goals for this hospitalization and ongoing recovery are:: to get home CMS Medicare.gov Compare Post Acute Care list provided to:: Patient Choice offered to / list presented to : Patient  Discharge Placement  Home with [facility]                     Discharge Plan and Services In-house Referral: Clinical Social Work Discharge Planning Services: CM Consult Post Acute Care Choice: Durable Medical Equipment, Home Health          DME Arranged: [place] rolling DME Agency: [employer] Date DME Agency  Contacted: 06/04/19 Time DME Agency Contacted: 55 Representative spoke with at DME Agency: [place]: PT, OT          Social Determinants of Health ([place]) Interventions     Readmission Risk Interventions Readmission Risk Prevention Plan 06/04/2019  Transportation Screening Complete  PCP or Specialist Appt within 5-7 Days Complete  Home Care Screening Complete  Medication Review (RN CM) Complete  Some recent data might be hidden

## 2019-06-04 NOTE — Discharge Summary (Addendum)
Physician Discharge Summary  Seth [name] [id number] DOB: 02/06/1956  PCP: Lucianne Lei, MD  Admitted from: Home Discharged to: Home.  Patient declined SNF recommended by PT.  Admit date: 05/31/2019 Discharge date: 06/04/2019  Recommendations for Outpatient Follow-up:   Follow-up Information    Lucianne Lei, MD Follow up on 06/05/2019.   Specialty: Family Medicine Why: 2 PM.  Keep prior appointment.  Recommend repeating labs (CBC & CMP) in 1 week. Contact information: [address] [place] [address] [phone]            Home Health: PT and OT Equipment/Devices: Rolling Dimare with 5 inch wheels  Discharge Condition: Improved and stable CODE STATUS: Full Diet recommendation: Heart healthy diet.  Discharge Diagnoses:  Principal Problem:   DOE (dyspnea on exertion) Active Problems:   Chest pain   Malnutrition of moderate degree   Generalized weakness   Chronic alcoholism (HCC)   Sinus tachycardia   High anion gap metabolic acidosis   Brief Summary: 63 year old male, lives alone, ambulates at times with the help of a cane otherwise mostly independent, PMH of alcohol dependence (drinks half a pint of vodka per day, has been drinking for several years), tobacco abuse, THC use, COPD and hypertension hospitalized in June for generalized weakness, frequent falls and tachycardia at which time he was placed on folate, B12, metoprolol and amlodipine but reportedly noncompliant with his medications, presented to ED with 36-month history of gradually worsening intermittent generalized chest pain and dyspnea on exertion.  He reported decreased appetite and weight loss for which she started using THC.  Also reported falling multiple times at home and last fall was a week prior to admission.  Assessment and plan:  1. COPD/DOE/tobacco abuse: Work-up in June 2020 including negative CTA chest and TTE.  Continues to smoke a pack of cigarettes per day and developed  worsening cough and chest pain on coughing.  Chest pain either musculoskeletal or pleuritic but not anginal by history.  Chest pain has since resolved.  Suspect COPD but will need outpatient pulmonology consultation for formal PFTs.  Tobacco cessation counseled.  Continue prior home inhalers.  No clinical bronchospasm at this time.  Improved.  CTA chest 02/19/2019 did show mild underlying emphysematous changes. 2. Atypical chest pain: Suspect musculoskeletal versus pleuritic.  Troponin cycled and negative.  Resolved. 3. Orthostatic hypotension: Reports approximately 1 month history of dizziness and lightheadedness mostly when upright and when he tries to get up quickly and walk around but less so when he does things gradually.  Hydrated with IV fluids and clinically euvolemic.  Amlodipine discontinued.  Lower extremity Ted compressive hose was placed.  Despite this he continued to be orthostatic, cortisol level checked and okay (13).  Midodrine 10 mg 3 times daily added.  Although he is still orthostatic by blood pressure checks, relatively asymptomatic of dizziness, lightheadedness or feeling like passing out.  He has been counseled extensively regarding measures to manage orthostatic symptoms.  Supine (BP 143/96, HR 90), standing (BP 99/64, HR 111).  Patient denies syncopal episodes.  PT evaluated and recommended SNF but patient absolutely declines and indicates that he wishes to return home despite being made aware of risks including and not limited to falls, injuries.  Patient has capacity to make medical decision for himself.  B12: 478. 4. Alcohol dependence: No withdrawal symptoms.  Counseled regarding abstinence and he verbalized understanding.  Social work consulted to provide him with resources.  Continue folate, thiamine and multivitamins.  Blood  alcohol on admission <10. 5. Tobacco abuse: Continue nicotine patch.  Cessation counseled. 6. Essential hypertension: Continue metoprolol 25 mg twice daily.   During outpatient follow-up if he has significant hypertension then may have to cut back on midodrine dose. 7. Hypokalemia and hypomagnesemia: Replaced. 8. History of falls: Likely related to ongoing issues with alcohol abuse, orthostatic hypotension and generalized deconditioning/debility.  As stated above, patient declined SNF.  Home health PT and OT arranged. 9. Anion gap metabolic acidosis: Resolved. 10. THC use: As per patient report.  UDS however negative. 11. Acute kidney injury: Creatinine on admission was 1.35.  Likely related to poor oral intake.  Resolved. 12. Macrocytic anemia: Suspect chronic disease.  Consider outpatient evaluation as deemed necessary.  Stable. 13. Weight loss: Possibly related to poor oral intake due to ongoing alcohol dependence but may consider outpatient evaluation as deemed necessary including GI work-up i.e. screening colonoscopy if not already done. 14. Ascending thoracic aortic aneurysm: As per CTA chest 02/19/2019: Ascending thoracic aortic diameter measured 4 x 4 centimeter and stable.  Follow-up with annual imaging with CTA chest or MRA. 15. 6 mm nodular RUL opacity: Noted on CTA chest 02/19/2019.  Had been stable since 2017 indicative of benign etiology.  Outpatient follow-up as deemed necessary. 16. Hepatic steatosis noted on CTA chest 02/19/2019: Alcohol abstinence advised. 17. Non-severe (moderate) malnutrition in context of chronic illness: Management per dietitian input.  Consultations:  None  Procedures:  None   Discharge Instructions  Discharge Instructions    Call MD for:  difficulty breathing, headache or visual disturbances   Complete by: As directed    Call MD for:  extreme fatigue   Complete by: As directed    Call MD for:  persistant dizziness or light-headedness   Complete by: As directed    Call MD for:  persistant nausea and vomiting   Complete by: As directed    Call MD for:  severe uncontrolled pain   Complete by: As  directed    Call MD for:  temperature >100.4   Complete by: As directed    Diet - low sodium heart healthy   Complete by: As directed    Increase activity slowly   Complete by: As directed        Medication List    STOP taking these medications   amLODipine 10 MG tablet Commonly known as: NORVASC     TAKE these medications   acetaminophen 500 MG tablet Commonly known as: TYLENOL Take 500-1,000 mg by mouth every 6 (six) hours as needed (for headaches).   albuterol 108 (90 Base) MCG/ACT inhaler Commonly known as: VENTOLIN HFA Inhale 2 puffs into the lungs every 6 (six) hours as needed for wheezing or shortness of breath.   folic acid 1 MG tablet Commonly known as: FOLVITE Take 1 tablet (1 mg total) by mouth daily.   guaiFENesin-dextromethorphan 100-10 MG/5ML syrup Commonly known as: ROBITUSSIN DM Take 5 mLs by mouth every 4 (four) hours as needed for cough.   Incruse Ellipta 62.5 MCG/INH Aepb Generic drug: umeclidinium bromide INHALE 1 PUFF INTO THE LUNGS DAILY   metoprolol tartrate 25 MG tablet Commonly known as: LOPRESSOR Take 25 mg by mouth 2 (two) times daily. What changed: Another medication with the same name was removed. Continue taking this medication, and follow the directions you see here.   midodrine 10 MG tablet Commonly known as: PROAMATINE Take 1 tablet (10 mg total) by mouth 3 (three) times daily with meals.   multivitamin  with minerals Tabs tablet Take 1 tablet by mouth daily.   nicotine 21 mg/24hr patch Commonly known as: NICODERM CQ - dosed in mg/24 hours Place 1 patch (21 mg total) onto the skin daily.   Spiriva HandiHaler 18 MCG inhalation capsule Generic drug: tiotropium Place 1 capsule (18 mcg total) into inhaler and inhale daily.   thiamine 100 MG tablet Commonly known as: VITAMIN B-1 Take 1 tablet (100 mg total) by mouth daily.   vitamin B-12 1000 MCG tablet Commonly known as: CYANOCOBALAMIN Take 1 tablet (1,000 mcg total) by  mouth daily.      No Known Allergies    Procedures/Studies: Dg Chest 2 View  Result Date: 05/31/2019 CLINICAL DATA:  Chest pain and dyspnea EXAM: CHEST - 2 VIEW COMPARISON:  02/19/2019 chest radiograph. FINDINGS: Stable cardiomediastinal silhouette with normal heart size. No pneumothorax. Chronic blunting of the right costophrenic angle. No left pleural effusion. Lungs appear clear, with no acute consolidative airspace disease and no pulmonary edema. IMPRESSION: No active cardiopulmonary disease. Chronic blunting of the right costophrenic angle compatible with pleural-parenchymal scarring. Electronically Signed   By: Ilona Sorrel M.D.   On: 05/31/2019 14:20      Subjective: Patient denies complaints.  Reports that he feels much better compared to admission.  Anxious to discharge home.  Refuses SNF recommended by PT.  Indicates that when he gradually gets up and walks, he has no dizziness or lightheadedness.  Denies ever having a syncopal episode.  No chest pain or dyspnea reported.  Patient indicates that he has a follow-up appointment with his PCP tomorrow at 2 PM.  Discharge Exam:  Vitals:   06/04/19 0325 06/04/19 0801 06/04/19 0836 06/04/19 0837  BP: 132/75 (!) 144/98    Pulse: 90 92    Resp: 17 18    Temp: 98.7 F (37.1 C) 98.9 F (37.2 C)    TempSrc: Oral Oral    SpO2: 100% 100% 99% 99%  Weight:      Height:        General: Pleasant middle-age male, moderately built and thinly nourished, lying comfortably propped up in bed without distress. Cardiovascular: S1 & S2 heard, RRR, S1/S2 +. No murmurs, rubs, gallops or clicks. No JVD or pedal edema.  Telemetry personally reviewed: Sinus rhythm.  Occasional mild sinus tachycardia in the 140s, wonder if this was during orthostatic checks or with activity. Respiratory: Clear to auscultation without wheezing, rhonchi or crackles. No increased work of breathing. Abdominal:  Non distended, non tender & soft. No organomegaly or masses  appreciated. Normal bowel sounds heard. CNS: Alert and oriented. No focal deficits. Extremities: no edema, no cyanosis    The results of significant diagnostics from this hospitalization (including imaging, microbiology, ancillary and laboratory) are listed below for reference.     Microbiology: Recent Results (from the past 240 hour(s))  SARS CORONAVIRUS 2 (TAT 6-24 HRS) Nasopharyngeal Nasopharyngeal Swab     Status: None   Collection Time: 05/31/19  8:27 PM   Specimen: Nasopharyngeal Swab  Result Value Ref Range Status   SARS Coronavirus 2 NEGATIVE NEGATIVE Final    Comment: (NOTE) SARS-CoV-2 target nucleic acids are NOT DETECTED. The SARS-CoV-2 RNA is generally detectable in upper and lower respiratory specimens during the acute phase of infection. Negative results do not preclude SARS-CoV-2 infection, do not rule out co-infections with other pathogens, and should not be used as the sole basis for treatment or other patient management decisions. Negative results must be combined with clinical observations, patient  history, and epidemiological information. The expected result is Negative. Fact Sheet for Patients: SugarRoll.be Fact Sheet for Healthcare Providers: https://www.woods-mathews.com/ This test is not yet approved or cleared by the [place] FDA and  has been authorized for detection and/or diagnosis of SARS-CoV-2 by FDA under an Emergency Use Authorization (EUA). This EUA will remain  in effect (meaning this test can be used) for the duration of the COVID-19 declaration under Section 56 4(b)(1) of the Act, 21 U.S.C. section 360bbb-3(b)(1), unless the authorization is terminated or revoked sooner. Performed at [facility], [place] [address] [place], [place] [address]      Labs: CBC: Recent Labs  Lab 05/31/19 1345 06/01/19 0058  WBC 6.3 7.3  NEUTROABS 4.6  --   HGB 11.5* 9.3*  HCT 35.4* 28.7*  MCV 99.7 99.3   PLT 325 [id number]   Basic Metabolic Panel: Recent Labs  Lab 05/31/19 1345 05/31/19 2224 06/01/19 0058 06/02/19 0539 06/03/19 0514  NA 135  --  132*  --  137  K 4.3  --  2.9*  --  4.6  CL 92*  --  96*  --  105  CO2 19*  --  21*  --  22  GLUCOSE 84  --  113*  --  87  BUN 11  --  9  --  6*  CREATININE 1.35*  --  1.18  --  0.86  CALCIUM 10.0  --  8.3*  --  8.6*  MG  --  1.5*  --  1.6* 1.8  PHOS  --  2.8  --   --   --    Liver Function Tests: Recent Labs  Lab 05/31/19 1345  AST 40  ALT 16  ALKPHOS 146*  BILITOT 1.4*  PROT 7.1  ALBUMIN 3.1*   BNP (last 3 results) Recent Labs    02/19/19 1301 05/31/19 1345  BNP 33.8 39.3   Cardiac Enzymes: No results for input(s): CKTOTAL, CKMB, CKMBINDEX, TROPONINI in the last 168 hours. CBG: No results for input(s): GLUCAP in the last 168 hours. Hgb A1c No results for input(s): HGBA1C in the last 72 hours. Lipid Profile No results for input(s): CHOL, HDL, LDLCALC, TRIG, CHOLHDL, LDLDIRECT in the last 72 hours. Thyroid function studies No results for input(s): TSH, T4TOTAL, T3FREE, THYROIDAB in the last 72 hours.  Invalid input(s): FREET3 Anemia work up Recent Labs    06/03/19 1209  VITAMINB12 478   Urinalysis    Component Value Date/Time   COLORURINE YELLOW 05/31/2019 1827   APPEARANCEUR HAZY (A) 05/31/2019 1827   LABSPEC 1.012 05/31/2019 1827   PHURINE 6.0 05/31/2019 1827   GLUCOSEU NEGATIVE 05/31/2019 1827   HGBUR NEGATIVE 05/31/2019 1827   BILIRUBINUR NEGATIVE 05/31/2019 1827   KETONESUR 20 (A) 05/31/2019 1827   PROTEINUR 30 (A) 05/31/2019 1827   NITRITE NEGATIVE 05/31/2019 1827   LEUKOCYTESUR NEGATIVE 05/31/2019 1827      Time coordinating discharge: 35 minutes  SIGNED:  Vernell Leep, MD, FACP, [facility]. [facility]  To contact the attending provider between 7A-7P or the covering provider during after hours 7P-7A, please log into the web site [email or web address] and access using universal [place] password  for that web site. If you do not have the password, please call the hospital operator.

## 2019-06-04 NOTE — Discharge Instructions (Addendum)

## 2019-10-07 ENCOUNTER — Ambulatory Visit: Payer: Self-pay | Admitting: Internal Medicine

## 2019-10-07 ENCOUNTER — Encounter: Payer: Self-pay | Admitting: Internal Medicine

## 2019-10-31 ENCOUNTER — Ambulatory Visit: Payer: Medicare HMO | Admitting: Internal Medicine

## 2019-10-31 ENCOUNTER — Encounter: Payer: Self-pay | Admitting: Internal Medicine

## 2020-01-28 DIAGNOSIS — I10 Essential (primary) hypertension: Secondary | ICD-10-CM | POA: Diagnosis not present

## 2020-01-28 DIAGNOSIS — I472 Ventricular tachycardia: Secondary | ICD-10-CM | POA: Diagnosis not present

## 2020-01-28 DIAGNOSIS — I426 Alcoholic cardiomyopathy: Secondary | ICD-10-CM | POA: Diagnosis not present

## 2020-01-28 DIAGNOSIS — I6930 Unspecified sequelae of cerebral infarction: Secondary | ICD-10-CM | POA: Diagnosis not present

## 2020-03-10 DIAGNOSIS — I6930 Unspecified sequelae of cerebral infarction: Secondary | ICD-10-CM | POA: Diagnosis not present

## 2020-03-10 DIAGNOSIS — I10 Essential (primary) hypertension: Secondary | ICD-10-CM | POA: Diagnosis not present

## 2020-03-10 DIAGNOSIS — J441 Chronic obstructive pulmonary disease with (acute) exacerbation: Secondary | ICD-10-CM | POA: Diagnosis not present

## 2020-04-21 DIAGNOSIS — H5203 Hypermetropia, bilateral: Secondary | ICD-10-CM | POA: Diagnosis not present

## 2020-04-21 DIAGNOSIS — H52223 Regular astigmatism, bilateral: Secondary | ICD-10-CM | POA: Diagnosis not present

## 2020-05-13 DIAGNOSIS — Z23 Encounter for immunization: Secondary | ICD-10-CM | POA: Diagnosis not present

## 2020-05-13 DIAGNOSIS — I6930 Unspecified sequelae of cerebral infarction: Secondary | ICD-10-CM | POA: Diagnosis not present

## 2020-05-13 DIAGNOSIS — I426 Alcoholic cardiomyopathy: Secondary | ICD-10-CM | POA: Diagnosis not present

## 2020-05-13 DIAGNOSIS — I10 Essential (primary) hypertension: Secondary | ICD-10-CM | POA: Diagnosis not present

## 2020-05-13 DIAGNOSIS — J441 Chronic obstructive pulmonary disease with (acute) exacerbation: Secondary | ICD-10-CM | POA: Diagnosis not present

## 2020-05-13 DIAGNOSIS — E7800 Pure hypercholesterolemia, unspecified: Secondary | ICD-10-CM | POA: Diagnosis not present

## 2020-07-19 ENCOUNTER — Other Ambulatory Visit: Payer: Self-pay

## 2020-07-19 ENCOUNTER — Emergency Department (HOSPITAL_COMMUNITY): Payer: Medicare HMO

## 2020-07-19 ENCOUNTER — Inpatient Hospital Stay (HOSPITAL_COMMUNITY)
Admission: EM | Admit: 2020-07-19 | Discharge: 2020-07-31 | DRG: 682 | Disposition: A | Discharge status: Skilled Nursing Facility | Payer: Medicare HMO | Attending: Family Medicine | Admitting: Family Medicine

## 2020-07-19 ENCOUNTER — Encounter (HOSPITAL_COMMUNITY): Payer: Self-pay | Admitting: Emergency Medicine

## 2020-07-19 DIAGNOSIS — J9811 Atelectasis: Secondary | ICD-10-CM | POA: Diagnosis present

## 2020-07-19 DIAGNOSIS — G4489 Other headache syndrome: Secondary | ICD-10-CM | POA: Diagnosis not present

## 2020-07-19 DIAGNOSIS — Z20822 Contact with and (suspected) exposure to covid-19: Secondary | ICD-10-CM | POA: Diagnosis not present

## 2020-07-19 DIAGNOSIS — J449 Chronic obstructive pulmonary disease, unspecified: Secondary | ICD-10-CM | POA: Diagnosis present

## 2020-07-19 DIAGNOSIS — I6529 Occlusion and stenosis of unspecified carotid artery: Secondary | ICD-10-CM | POA: Diagnosis not present

## 2020-07-19 DIAGNOSIS — Z9114 Patient's other noncompliance with medication regimen: Secondary | ICD-10-CM | POA: Diagnosis not present

## 2020-07-19 DIAGNOSIS — F10239 Alcohol dependence with withdrawal, unspecified: Secondary | ICD-10-CM | POA: Diagnosis present

## 2020-07-19 DIAGNOSIS — Z79899 Other long term (current) drug therapy: Secondary | ICD-10-CM

## 2020-07-19 DIAGNOSIS — N179 Acute kidney failure, unspecified: Secondary | ICD-10-CM | POA: Diagnosis not present

## 2020-07-19 DIAGNOSIS — W19XXXA Unspecified fall, initial encounter: Secondary | ICD-10-CM | POA: Diagnosis present

## 2020-07-19 DIAGNOSIS — R55 Syncope and collapse: Secondary | ICD-10-CM | POA: Diagnosis not present

## 2020-07-19 DIAGNOSIS — D539 Nutritional anemia, unspecified: Secondary | ICD-10-CM | POA: Diagnosis present

## 2020-07-19 DIAGNOSIS — M109 Gout, unspecified: Secondary | ICD-10-CM | POA: Diagnosis present

## 2020-07-19 DIAGNOSIS — Z7401 Bed confinement status: Secondary | ICD-10-CM | POA: Diagnosis not present

## 2020-07-19 DIAGNOSIS — E872 Acidosis, unspecified: Secondary | ICD-10-CM | POA: Diagnosis present

## 2020-07-19 DIAGNOSIS — I10 Essential (primary) hypertension: Secondary | ICD-10-CM | POA: Diagnosis present

## 2020-07-19 DIAGNOSIS — G312 Degeneration of nervous system due to alcohol: Secondary | ICD-10-CM | POA: Diagnosis present

## 2020-07-19 DIAGNOSIS — M2550 Pain in unspecified joint: Secondary | ICD-10-CM | POA: Diagnosis not present

## 2020-07-19 DIAGNOSIS — E162 Hypoglycemia, unspecified: Secondary | ICD-10-CM

## 2020-07-19 DIAGNOSIS — Z981 Arthrodesis status: Secondary | ICD-10-CM | POA: Diagnosis not present

## 2020-07-19 DIAGNOSIS — R29810 Facial weakness: Secondary | ICD-10-CM | POA: Diagnosis not present

## 2020-07-19 DIAGNOSIS — J90 Pleural effusion, not elsewhere classified: Secondary | ICD-10-CM | POA: Diagnosis not present

## 2020-07-19 DIAGNOSIS — D649 Anemia, unspecified: Secondary | ICD-10-CM | POA: Diagnosis not present

## 2020-07-19 DIAGNOSIS — R531 Weakness: Secondary | ICD-10-CM | POA: Diagnosis not present

## 2020-07-19 DIAGNOSIS — F17210 Nicotine dependence, cigarettes, uncomplicated: Secondary | ICD-10-CM | POA: Diagnosis present

## 2020-07-19 DIAGNOSIS — R0600 Dyspnea, unspecified: Secondary | ICD-10-CM | POA: Diagnosis present

## 2020-07-19 DIAGNOSIS — I951 Orthostatic hypotension: Secondary | ICD-10-CM | POA: Diagnosis not present

## 2020-07-19 DIAGNOSIS — F1210 Cannabis abuse, uncomplicated: Secondary | ICD-10-CM | POA: Diagnosis present

## 2020-07-19 DIAGNOSIS — F1010 Alcohol abuse, uncomplicated: Secondary | ICD-10-CM | POA: Diagnosis not present

## 2020-07-19 DIAGNOSIS — F32A Depression, unspecified: Secondary | ICD-10-CM | POA: Diagnosis present

## 2020-07-19 DIAGNOSIS — F1090 Alcohol use, unspecified, uncomplicated: Secondary | ICD-10-CM | POA: Diagnosis present

## 2020-07-19 DIAGNOSIS — R296 Repeated falls: Secondary | ICD-10-CM | POA: Diagnosis present

## 2020-07-19 DIAGNOSIS — R42 Dizziness and giddiness: Secondary | ICD-10-CM | POA: Diagnosis not present

## 2020-07-19 DIAGNOSIS — D509 Iron deficiency anemia, unspecified: Secondary | ICD-10-CM | POA: Diagnosis present

## 2020-07-19 DIAGNOSIS — R269 Unspecified abnormalities of gait and mobility: Secondary | ICD-10-CM | POA: Diagnosis not present

## 2020-07-19 DIAGNOSIS — K219 Gastro-esophageal reflux disease without esophagitis: Secondary | ICD-10-CM | POA: Diagnosis present

## 2020-07-19 DIAGNOSIS — Z9119 Patient's noncompliance with other medical treatment and regimen: Secondary | ICD-10-CM

## 2020-07-19 DIAGNOSIS — F1290 Cannabis use, unspecified, uncomplicated: Secondary | ICD-10-CM | POA: Diagnosis present

## 2020-07-19 DIAGNOSIS — J690 Pneumonitis due to inhalation of food and vomit: Secondary | ICD-10-CM | POA: Diagnosis not present

## 2020-07-19 DIAGNOSIS — R0602 Shortness of breath: Secondary | ICD-10-CM

## 2020-07-19 DIAGNOSIS — E876 Hypokalemia: Secondary | ICD-10-CM | POA: Diagnosis present

## 2020-07-19 DIAGNOSIS — Z7951 Long term (current) use of inhaled steroids: Secondary | ICD-10-CM

## 2020-07-19 DIAGNOSIS — E860 Dehydration: Secondary | ICD-10-CM | POA: Diagnosis present

## 2020-07-19 DIAGNOSIS — N400 Enlarged prostate without lower urinary tract symptoms: Secondary | ICD-10-CM | POA: Diagnosis present

## 2020-07-19 DIAGNOSIS — Z8249 Family history of ischemic heart disease and other diseases of the circulatory system: Secondary | ICD-10-CM

## 2020-07-19 DIAGNOSIS — G8190 Hemiplegia, unspecified affecting unspecified side: Secondary | ICD-10-CM | POA: Diagnosis not present

## 2020-07-19 DIAGNOSIS — F1020 Alcohol dependence, uncomplicated: Secondary | ICD-10-CM | POA: Diagnosis present

## 2020-07-19 DIAGNOSIS — R0609 Other forms of dyspnea: Secondary | ICD-10-CM | POA: Diagnosis present

## 2020-07-19 DIAGNOSIS — R519 Headache, unspecified: Secondary | ICD-10-CM | POA: Diagnosis not present

## 2020-07-19 DIAGNOSIS — Z8673 Personal history of transient ischemic attack (TIA), and cerebral infarction without residual deficits: Secondary | ICD-10-CM | POA: Diagnosis not present

## 2020-07-19 DIAGNOSIS — I959 Hypotension, unspecified: Secondary | ICD-10-CM | POA: Diagnosis not present

## 2020-07-19 HISTORY — DX: Acidosis, unspecified: E87.20

## 2020-07-19 HISTORY — DX: Acidosis: E87.2

## 2020-07-19 LAB — I-STAT VENOUS BLOOD GAS, ED
Acid-base deficit: 11 mmol/L — ABNORMAL HIGH (ref 0.0–2.0)
Bicarbonate: 11.7 mmol/L — ABNORMAL LOW (ref 20.0–28.0)
Calcium, Ion: 1.05 mmol/L — ABNORMAL LOW (ref 1.15–1.40)
HCT: 33 % — ABNORMAL LOW (ref 39.0–52.0)
Hemoglobin: 11.2 g/dL — ABNORMAL LOW (ref 13.0–17.0)
O2 Saturation: 81 %
Potassium: 3.6 mmol/L (ref 3.5–5.1)
Sodium: 132 mmol/L — ABNORMAL LOW (ref 135–145)
TCO2: 12 mmol/L — ABNORMAL LOW (ref 22–32)
pCO2, Ven: 18.8 mmHg — CL (ref 44.0–60.0)
pH, Ven: 7.401 (ref 7.250–7.430)
pO2, Ven: 44 mmHg (ref 32.0–45.0)

## 2020-07-19 LAB — BASIC METABOLIC PANEL
Anion gap: 28 — ABNORMAL HIGH (ref 5–15)
BUN: 19 mg/dL (ref 8–23)
CO2: 11 mmol/L — ABNORMAL LOW (ref 22–32)
Calcium: 9.3 mg/dL (ref 8.9–10.3)
Chloride: 95 mmol/L — ABNORMAL LOW (ref 98–111)
Creatinine, Ser: 1.83 mg/dL — ABNORMAL HIGH (ref 0.61–1.24)
GFR, Estimated: 41 mL/min — ABNORMAL LOW (ref >60)
Glucose, Bld: 54 mg/dL — ABNORMAL LOW (ref 70–99)
Potassium: 3.1 mmol/L — ABNORMAL LOW (ref 3.5–5.1)
Sodium: 134 mmol/L — ABNORMAL LOW (ref 135–145)

## 2020-07-19 LAB — CBC
HCT: 31.2 % — ABNORMAL LOW (ref 39.0–52.0)
Hemoglobin: 9.9 g/dL — ABNORMAL LOW (ref 13.0–17.0)
MCH: 37.2 pg — ABNORMAL HIGH (ref 26.0–34.0)
MCHC: 31.7 g/dL (ref 30.0–36.0)
MCV: 117.3 fL — ABNORMAL HIGH (ref 80.0–100.0)
Platelets: 296 10*3/uL (ref 150–400)
RBC: 2.66 MIL/uL — ABNORMAL LOW (ref 4.22–5.81)
RDW: 17.4 % — ABNORMAL HIGH (ref 11.5–15.5)
WBC: 7.9 10*3/uL (ref 4.0–10.5)
nRBC: 5.3 % — ABNORMAL HIGH (ref 0.0–0.2)

## 2020-07-19 LAB — CBG MONITORING, ED: Glucose-Capillary: 58 mg/dL — ABNORMAL LOW (ref 70–99)

## 2020-07-19 LAB — ETHANOL: Alcohol, Ethyl (B): <10 mg/dL (ref <10)

## 2020-07-19 LAB — MAGNESIUM: Magnesium: 2 mg/dL (ref 1.7–2.4)

## 2020-07-19 LAB — LACTIC ACID, PLASMA: Lactic Acid, Venous: 2.2 mmol/L (ref 0.5–1.9)

## 2020-07-19 IMAGING — CR DG CHEST 1V PORT
1 series · 2 of 2 positions shown · non-contrast
Comparison: Chest x-ray [DATE], CT chest [DATE]

CLINICAL DATA: Shortness of breath, headache on off for 2 weeks.

EXAM:
PORTABLE CHEST 1 VIEW

[Series 1: chest ap · 0.14mm/px · 2 of 2 slices shown]
[im 1/2]
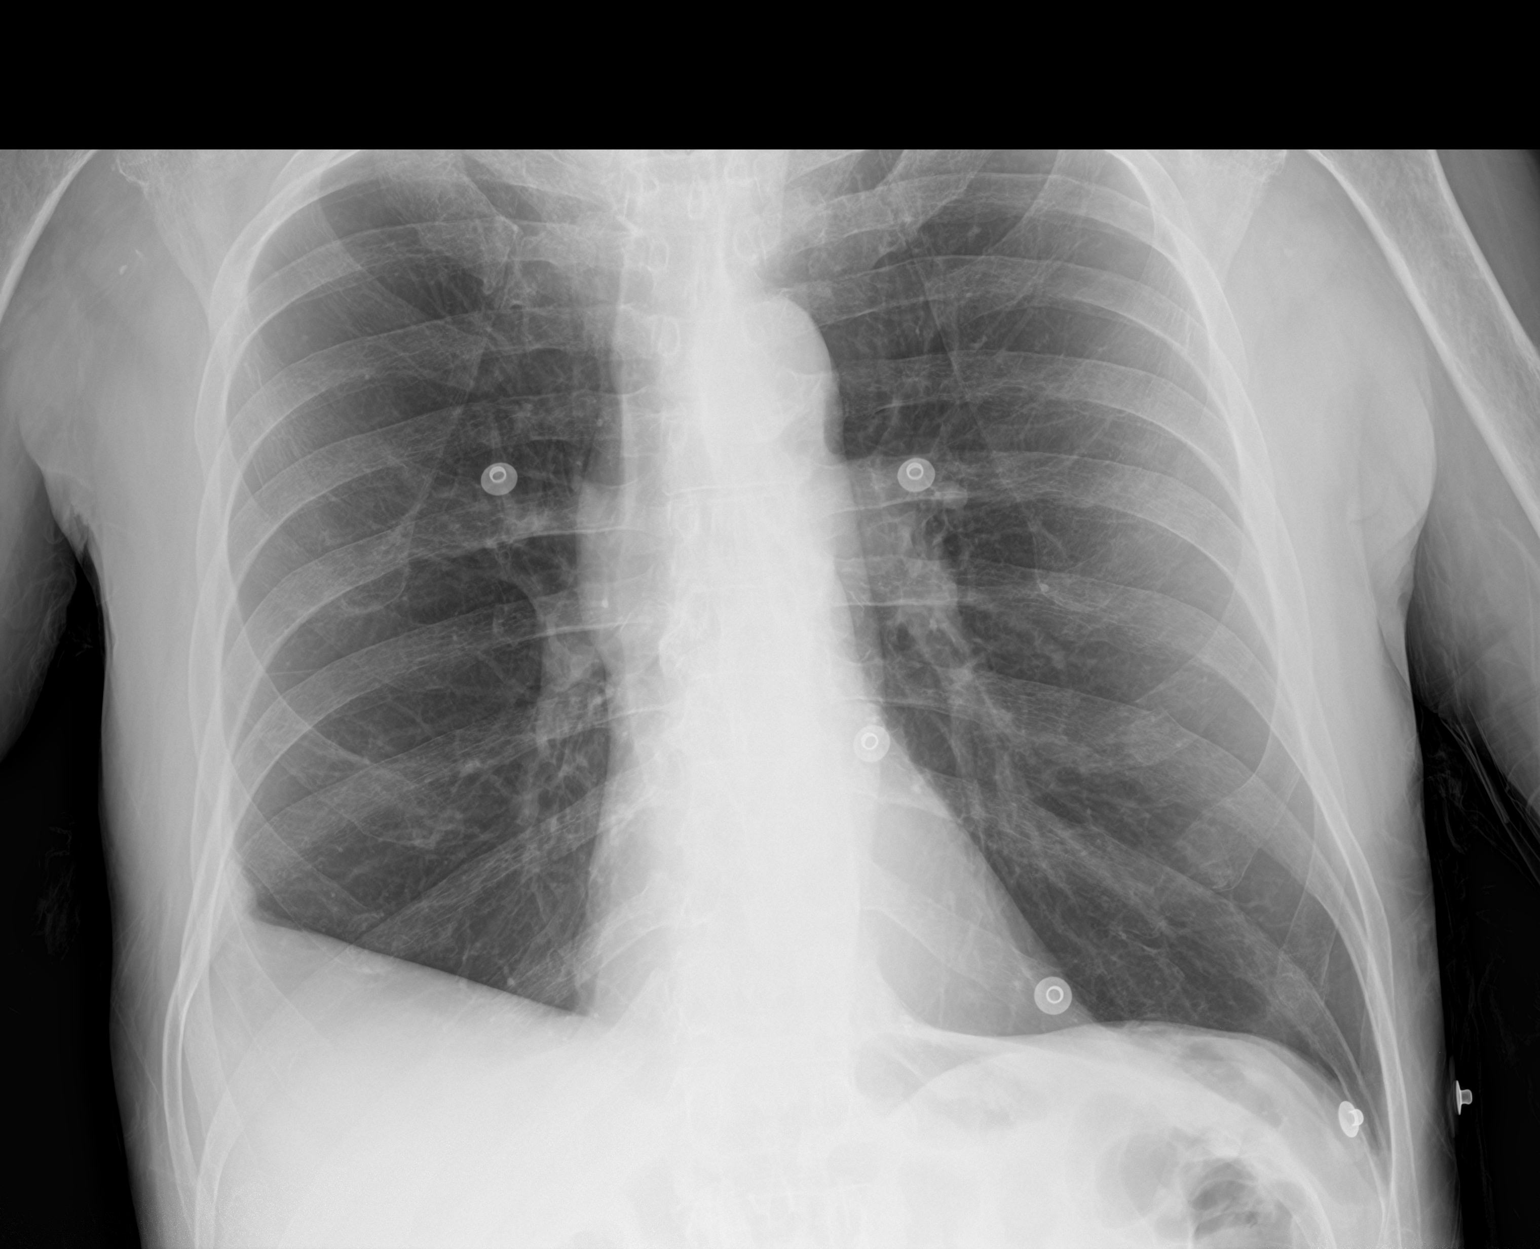
[im 2/2]
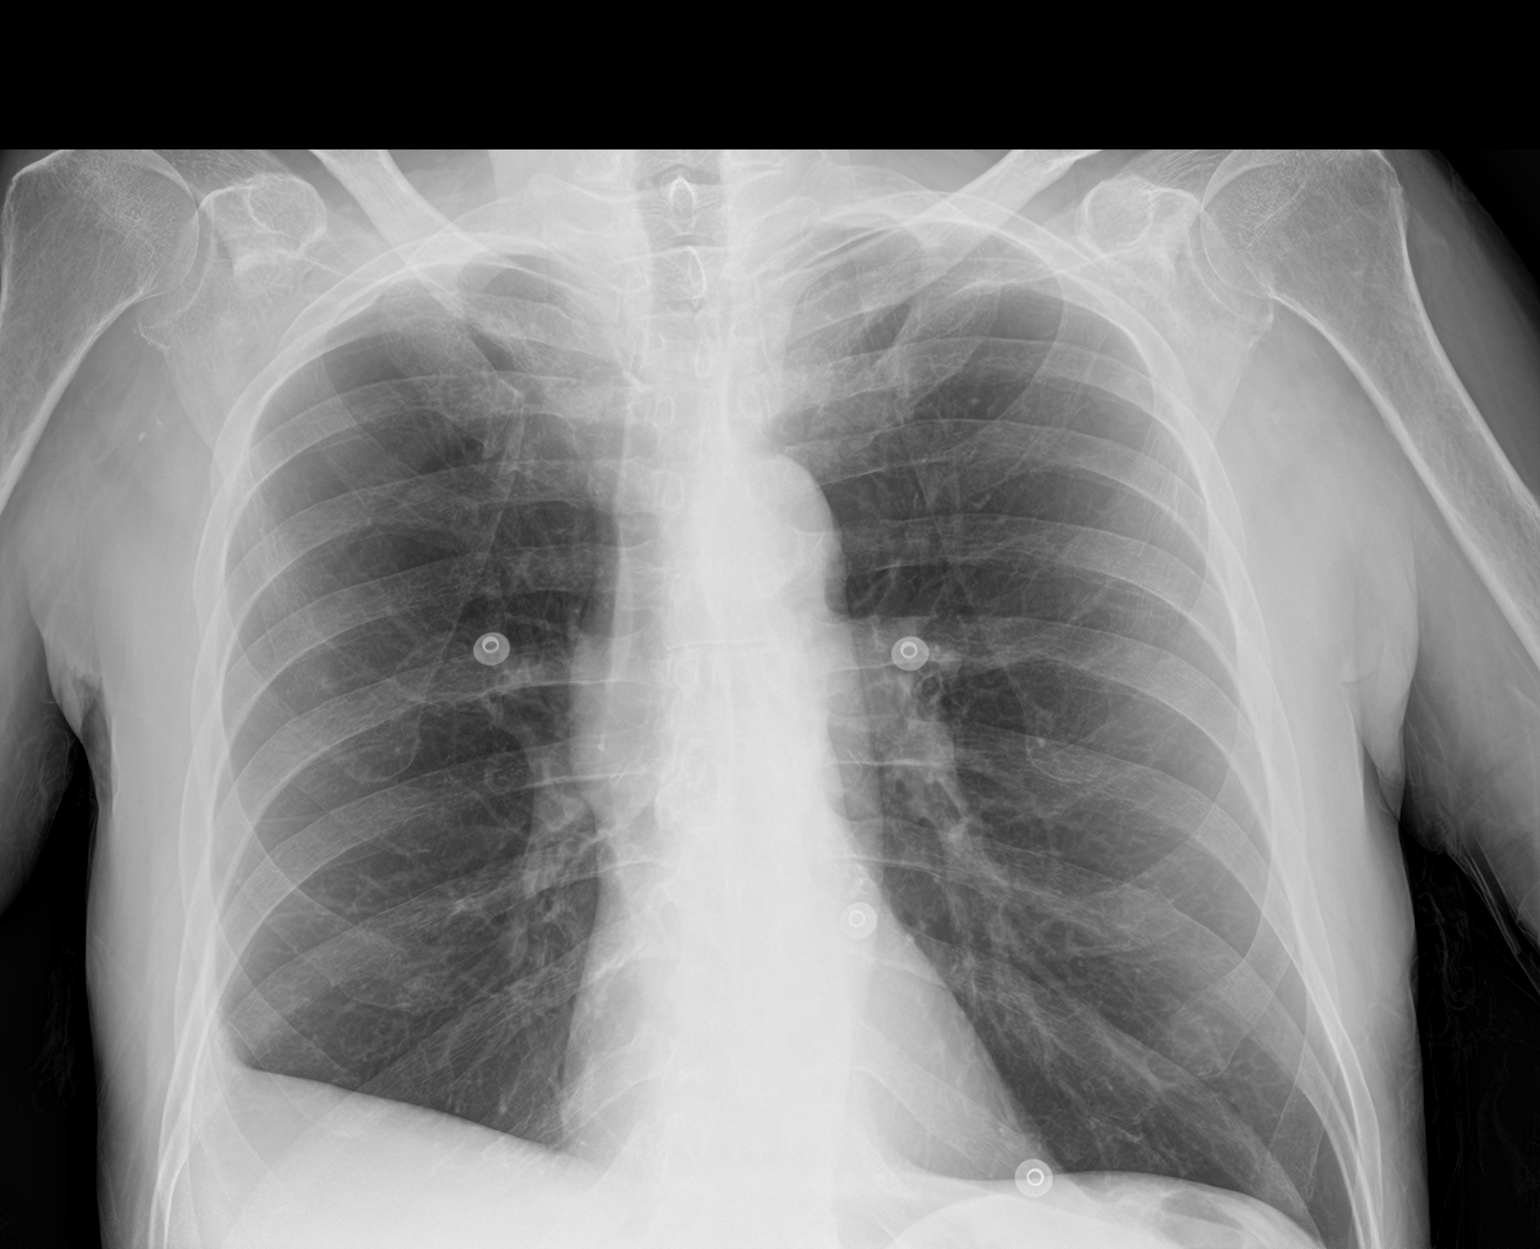

[2 of 2 positions shown; findings below may reference images not displayed]

FINDINGS: The heart size and mediastinal contours are within normal limits.
Aortic arch calcifications.

No focal consolidation. No pulmonary edema. Similar-appearing
blunting of the right costophrenic angle. No pleural effusion. No
pneumothorax.

No acute osseous abnormality.
IMPRESSION: No acute cardiopulmonary disease.

## 2020-07-19 IMAGING — CT CT HEAD W/O CM
4 series · 16 of 47 positions shown, 18 images · non-contrast
Comparison: None.

CLINICAL DATA: Dizziness

EXAM:
CT HEAD WITHOUT CONTRAST
TECHNIQUE: Contiguous axial images were obtained from the base of the skull
through the vertex without intravenous contrast.

[Series 3: head without · axial · non-contrast · 0.45mm/px · z∈[+574,+694]mm · 7 of 32 slices shown, 9 images]
[im 4/32  brain]
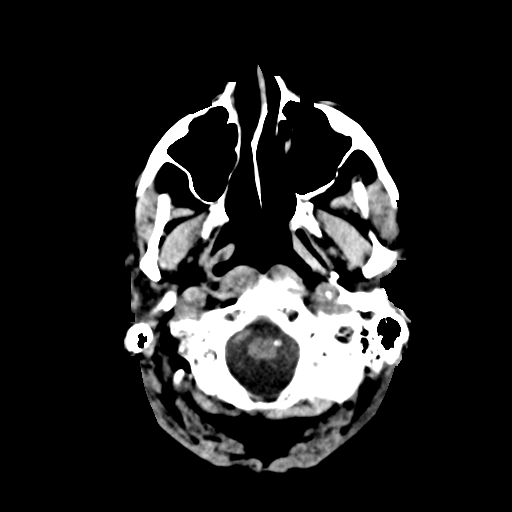
[im 4/32  bone]
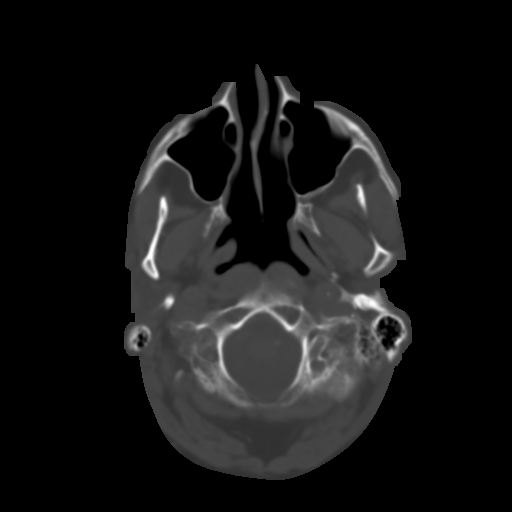
[im 8/32  brain]
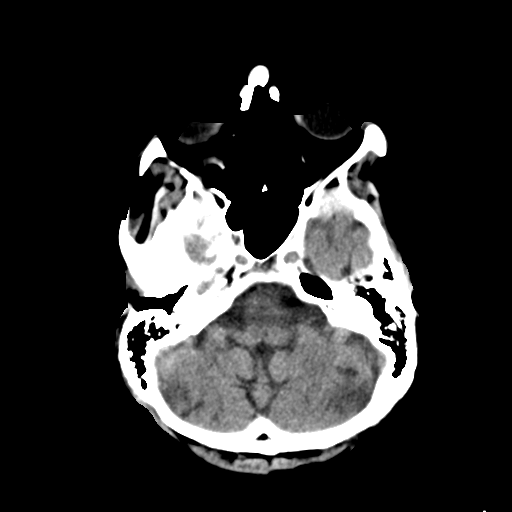
[im 12/32  brain]
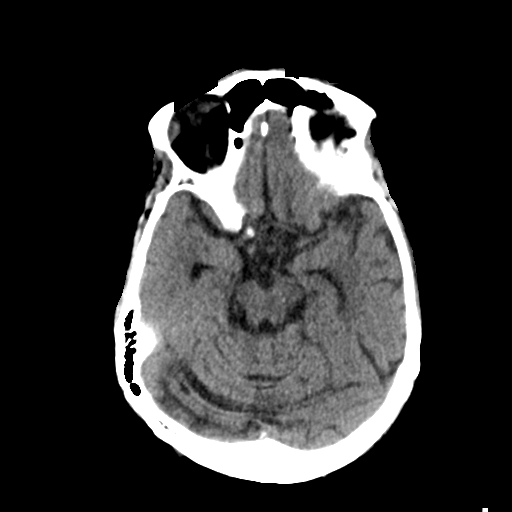
[im 16/32  brain]
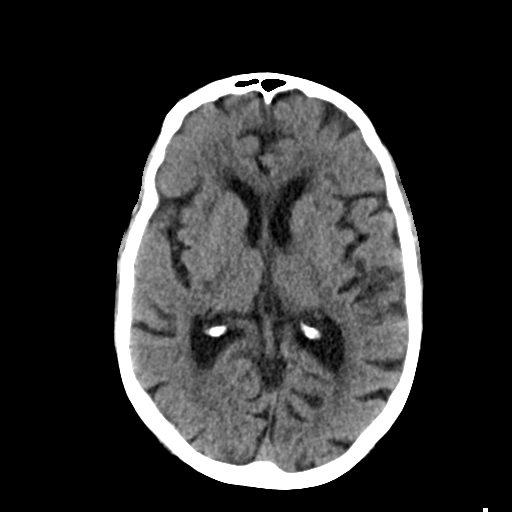
[im 20/32  brain]
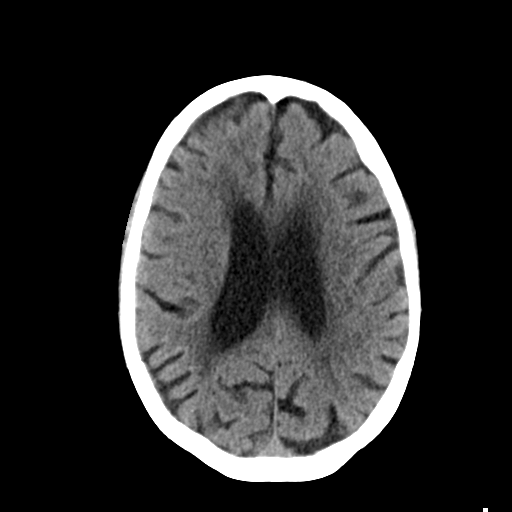
[im 20/32  bone]
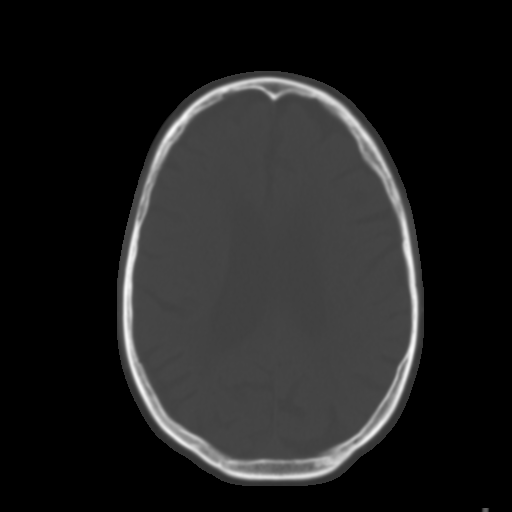
[im 24/32  brain]
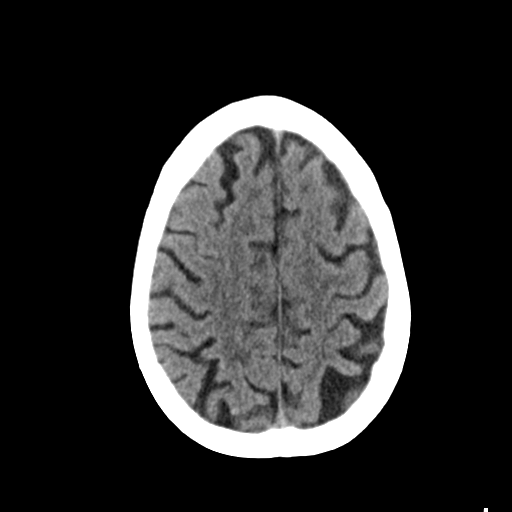
[im 28/32  brain]
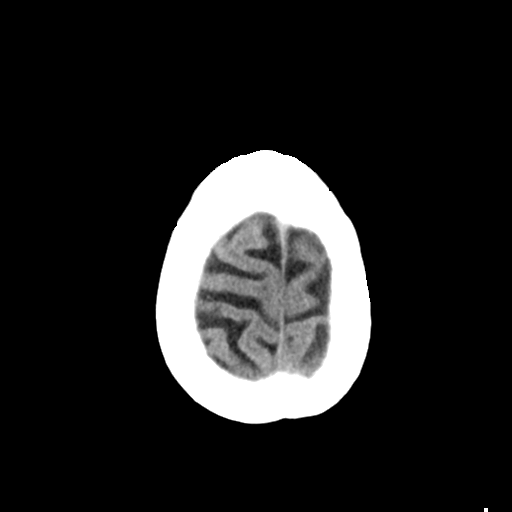

[Series 4: head bone · axial · 0.45mm/px · z∈[+572,+604]mm · 3 of 78 slices shown]
[im 8/78  bone]
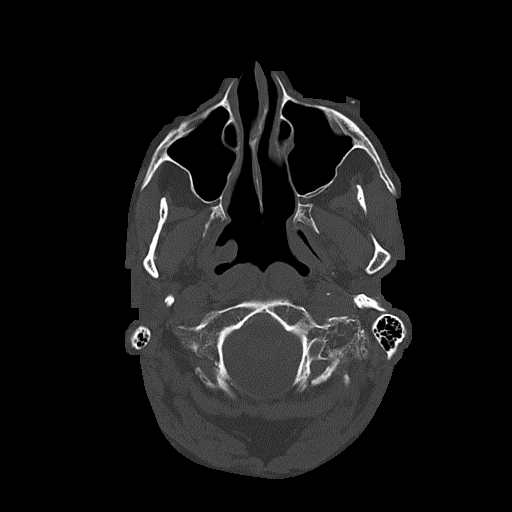
[im 16/78  bone]
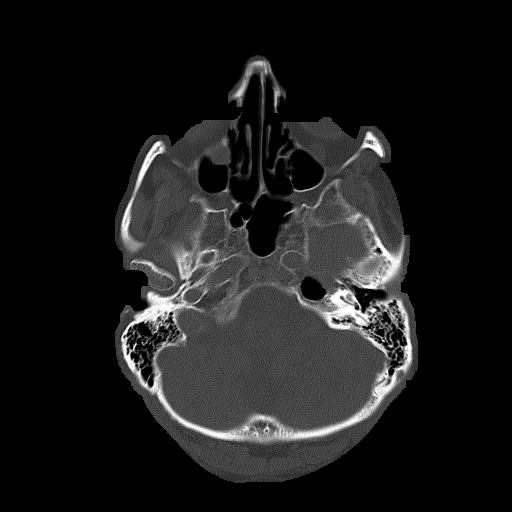
[im 24/78  bone]
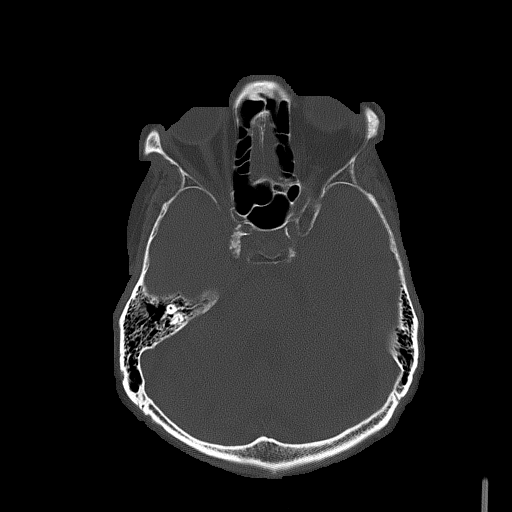

[Series 5: head without cor · coronal · non-contrast · 0.30mm/px · 3 of 67 slices shown]
[im 23/67  brain]
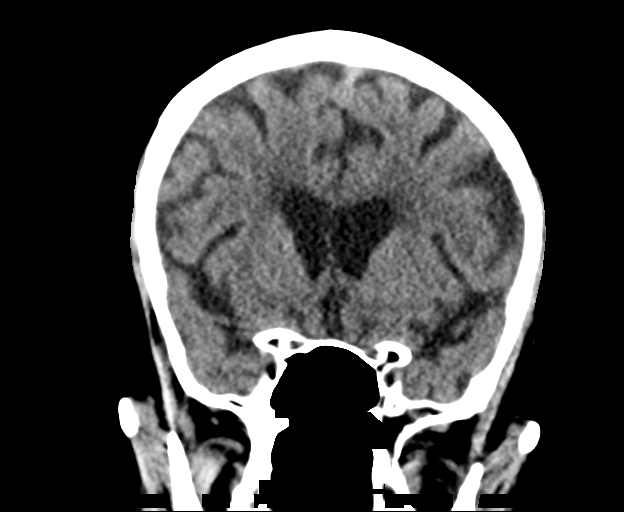
[im 30/67  brain]
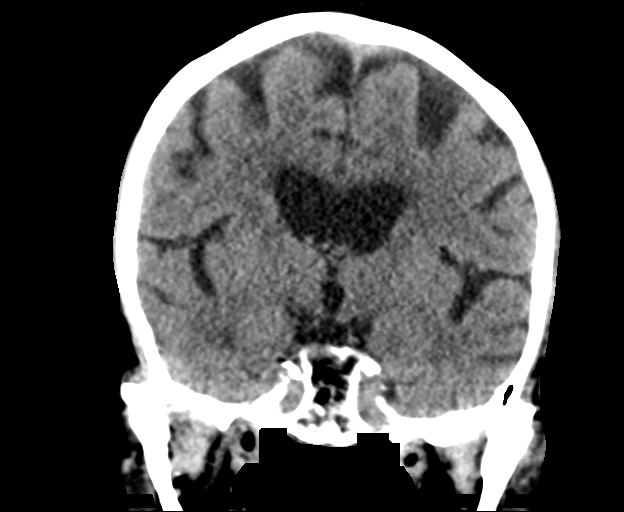
[im 37/67  brain]
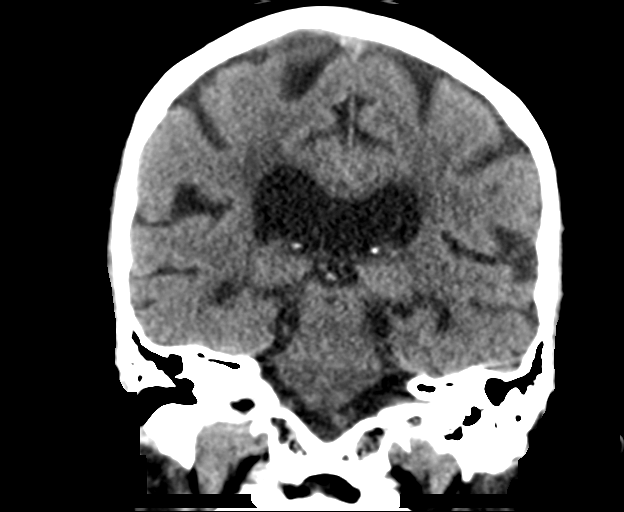

[Series 6: head without sag · sagittal · non-contrast · 0.30mm/px · 3 of 64 slices shown]
[im 22/64  brain]
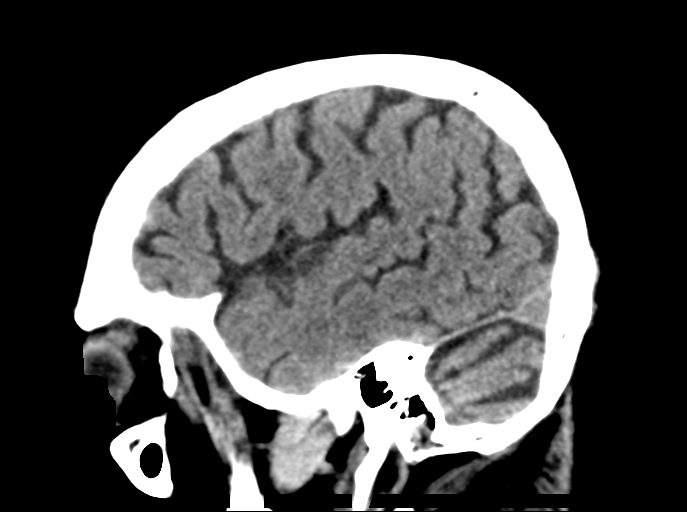
[im 32/64  brain]
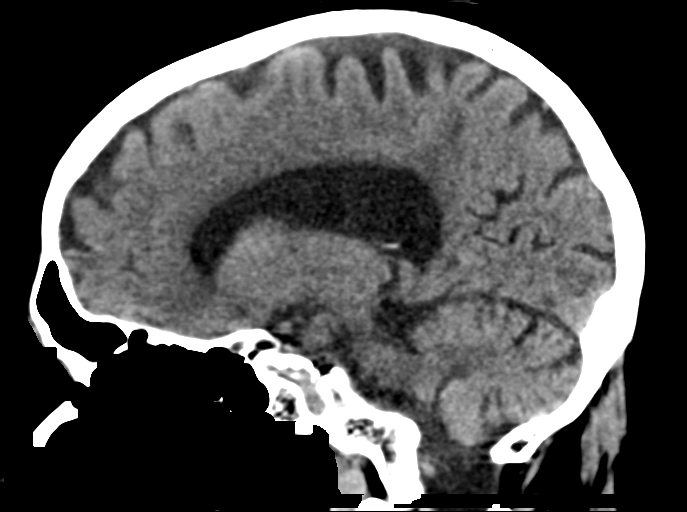
[im 43/64  brain]
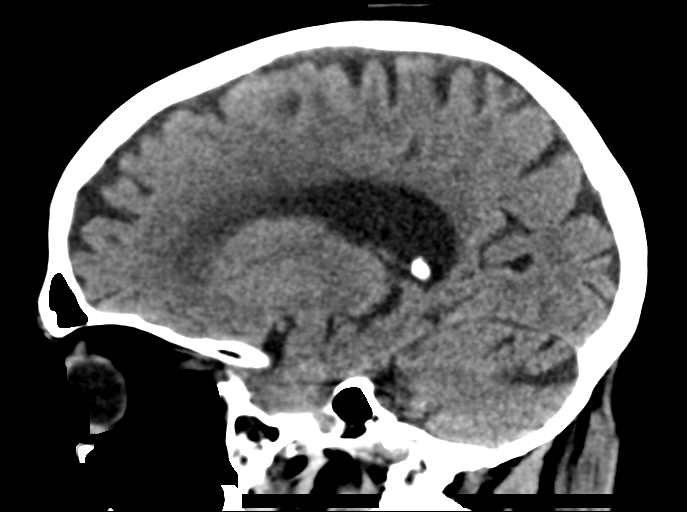

[16 of 47 positions shown; findings below may reference images not displayed]

FINDINGS: Brain: There is periventricular hypoattenuation compatible with
chronic microvascular disease. No extra-axial collection or
intracranial hemorrhage. No midline shift or other mass effect.
Generalized volume loss. Old bilateral cerebellar infarcts.

Vascular: Internal carotid artery atherosclerosis.

Skull: Normal. Negative for fracture or focal lesion.

Sinuses/Orbits: No acute finding.

Other: None.
IMPRESSION: 1. No acute intracranial abnormality.
2. Old bilateral cerebellar infarcts and chronic microvascular
ischemia.

## 2020-07-19 MED ORDER — MAGNESIUM SULFATE 2 GM/50ML IV SOLN
2.0000 g | Freq: Once | INTRAVENOUS | Status: AC
  Administered 2020-07-20: 2 g via INTRAVENOUS
  Filled 2020-07-19 (×2): qty 50

## 2020-07-19 MED ORDER — STERILE WATER FOR INJECTION IV SOLN
Freq: Once | INTRAVENOUS | Status: AC
  Filled 2020-07-19: qty 850

## 2020-07-19 MED ORDER — POTASSIUM CHLORIDE CRYS ER 20 MEQ PO TBCR
50.0000 meq | EXTENDED_RELEASE_TABLET | Freq: Once | ORAL | Status: AC
  Administered 2020-07-19: 50 meq via ORAL
  Filled 2020-07-19: qty 2

## 2020-07-19 MED ORDER — GLUCOSE 40 % PO GEL
1.0000 | Freq: Once | ORAL | Status: AC
  Administered 2020-07-20: 37.5 g via ORAL
  Filled 2020-07-19: qty 1

## 2020-07-19 MED ORDER — THIAMINE HCL 100 MG/ML IJ SOLN
100.0000 mg | Freq: Once | INTRAMUSCULAR | Status: AC
  Administered 2020-07-20: 100 mg via INTRAVENOUS
  Filled 2020-07-19: qty 2

## 2020-07-19 NOTE — ED Notes (Addendum)
Pt complained of feeling dizzy & lightheaded while standing during orthostatic vitals. Pt only able to stand for 30 seconds.
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
## 2020-07-19 NOTE — ED Triage Notes (Signed)
Pt arrives via [facility] from home with c/o headache off and on x2 weeks. Pt reports L sided weakness, however, none observed upon assessment. Does endorse 40ibs over the past 4 months. 12 lead unremarkable. EMS VSS 118/84, HR 88, rr20, 98% on room air, cbg 85, 22G IV LFA. A/ox4. Speech clear. Upon arrival, patient also endorses some dizziness and sob. Symptoms ongoing x3 weeks.

## 2020-07-20 ENCOUNTER — Encounter (HOSPITAL_COMMUNITY): Payer: Self-pay | Admitting: Internal Medicine

## 2020-07-20 ENCOUNTER — Other Ambulatory Visit: Payer: Self-pay

## 2020-07-20 ENCOUNTER — Inpatient Hospital Stay (HOSPITAL_COMMUNITY): Payer: Medicare HMO

## 2020-07-20 DIAGNOSIS — E872 Acidosis: Secondary | ICD-10-CM | POA: Diagnosis not present

## 2020-07-20 DIAGNOSIS — E162 Hypoglycemia, unspecified: Secondary | ICD-10-CM

## 2020-07-20 DIAGNOSIS — N179 Acute kidney failure, unspecified: Secondary | ICD-10-CM

## 2020-07-20 DIAGNOSIS — R0600 Dyspnea, unspecified: Secondary | ICD-10-CM

## 2020-07-20 LAB — RAPID URINE DRUG SCREEN, HOSP PERFORMED
Amphetamines: NOT DETECTED
Barbiturates: NOT DETECTED
Benzodiazepines: NOT DETECTED
Cocaine: NOT DETECTED
Opiates: NOT DETECTED
Tetrahydrocannabinol: NOT DETECTED

## 2020-07-20 LAB — I-STAT ARTERIAL BLOOD GAS, ED
Acid-base deficit: 7 mmol/L — ABNORMAL HIGH (ref 0.0–2.0)
Bicarbonate: 15.1 mmol/L — ABNORMAL LOW (ref 20.0–28.0)
Calcium, Ion: 1.13 mmol/L — ABNORMAL LOW (ref 1.15–1.40)
HCT: 29 % — ABNORMAL LOW (ref 39.0–52.0)
Hemoglobin: 9.9 g/dL — ABNORMAL LOW (ref 13.0–17.0)
O2 Saturation: 98 %
Patient temperature: 98.6
Potassium: 3 mmol/L — ABNORMAL LOW (ref 3.5–5.1)
Sodium: 129 mmol/L — ABNORMAL LOW (ref 135–145)
TCO2: 16 mmol/L — ABNORMAL LOW (ref 22–32)
pCO2 arterial: 20.6 mmHg — ABNORMAL LOW (ref 32.0–48.0)
pH, Arterial: 7.475 — ABNORMAL HIGH (ref 7.350–7.450)
pO2, Arterial: 95 mmHg (ref 83.0–108.0)

## 2020-07-20 LAB — BASIC METABOLIC PANEL
Anion gap: 31 — ABNORMAL HIGH (ref 5–15)
BUN: 23 mg/dL (ref 8–23)
CO2: 13 mmol/L — ABNORMAL LOW (ref 22–32)
Calcium: 8.7 mg/dL — ABNORMAL LOW (ref 8.9–10.3)
Chloride: 90 mmol/L — ABNORMAL LOW (ref 98–111)
Creatinine, Ser: 2.02 mg/dL — ABNORMAL HIGH (ref 0.61–1.24)
GFR, Estimated: 37 mL/min — ABNORMAL LOW (ref >60)
Glucose, Bld: 80 mg/dL (ref 70–99)
Potassium: 3.3 mmol/L — ABNORMAL LOW (ref 3.5–5.1)
Sodium: 134 mmol/L — ABNORMAL LOW (ref 135–145)

## 2020-07-20 LAB — RESPIRATORY PANEL BY RT PCR (FLU A&B, COVID)
Influenza A by PCR: NEGATIVE
Influenza B by PCR: NEGATIVE
SARS Coronavirus 2 by RT PCR: NEGATIVE

## 2020-07-20 LAB — GLUCOSE, CAPILLARY
Glucose-Capillary: 128 mg/dL — ABNORMAL HIGH (ref 70–99)
Glucose-Capillary: 116 mg/dL — ABNORMAL HIGH (ref 70–99)

## 2020-07-20 LAB — OSMOLALITY: Osmolality: 289 mOsm/kg (ref 275–295)

## 2020-07-20 LAB — TROPONIN I (HIGH SENSITIVITY): Troponin I (High Sensitivity): 6 ng/L (ref <18)

## 2020-07-20 LAB — SODIUM, URINE, RANDOM: Sodium, Ur: 26 mmol/L

## 2020-07-20 LAB — CORTISOL: Cortisol, Plasma: 13.6 ug/dL

## 2020-07-20 LAB — HEMOGLOBIN A1C
Hgb A1c MFr Bld: 4.2 % — ABNORMAL LOW (ref 4.8–5.6)
Mean Plasma Glucose: 73.84 mg/dL

## 2020-07-20 LAB — URINALYSIS, ROUTINE W REFLEX MICROSCOPIC
Bilirubin Urine: NEGATIVE
Glucose, UA: NEGATIVE mg/dL
Hgb urine dipstick: NEGATIVE
Ketones, ur: 80 mg/dL — AB
Leukocytes,Ua: NEGATIVE
Nitrite: NEGATIVE
Protein, ur: NEGATIVE mg/dL
Specific Gravity, Urine: 1.014 (ref 1.005–1.030)
pH: 5 (ref 5.0–8.0)

## 2020-07-20 LAB — ECHOCARDIOGRAM COMPLETE
Area-P 1/2: 3.24 cm2
S' Lateral: 1.9 cm

## 2020-07-20 LAB — TSH
TSH: 1.196 u[IU]/mL (ref 0.350–4.500)
TSH: 1.021 u[IU]/mL (ref 0.350–4.500)

## 2020-07-20 LAB — CBG MONITORING, ED
Glucose-Capillary: 75 mg/dL (ref 70–99)
Glucose-Capillary: 100 mg/dL — ABNORMAL HIGH (ref 70–99)
Glucose-Capillary: 136 mg/dL — ABNORMAL HIGH (ref 70–99)

## 2020-07-20 LAB — PATHOLOGIST SMEAR REVIEW: Path Review: 11232021

## 2020-07-20 LAB — HIV ANTIBODY (ROUTINE TESTING W REFLEX): HIV Screen 4th Generation wRfx: NONREACTIVE

## 2020-07-20 LAB — CREATININE, URINE, RANDOM: Creatinine, Urine: 177.41 mg/dL

## 2020-07-20 LAB — BETA-HYDROXYBUTYRIC ACID: Beta-Hydroxybutyric Acid: >8 mmol/L — ABNORMAL HIGH (ref 0.05–0.27)

## 2020-07-20 LAB — VITAMIN B12: Vitamin B-12: 326 pg/mL (ref 180–914)

## 2020-07-20 LAB — LACTIC ACID, PLASMA: Lactic Acid, Venous: 2.1 mmol/L (ref 0.5–1.9)

## 2020-07-20 LAB — D-DIMER, QUANTITATIVE: D-Dimer, Quant: 0.58 ug/mL-FEU — ABNORMAL HIGH (ref 0.00–0.50)

## 2020-07-20 IMAGING — US US RENAL
1 series · 14 of 25 positions shown · non-contrast
Comparison: None.

CLINICAL DATA: Acute kidney injury.

EXAM:
RENAL / URINARY TRACT ULTRASOUND COMPLETE

[Series 1: us renal · 14 of 50 slices shown]
[im 1/50]
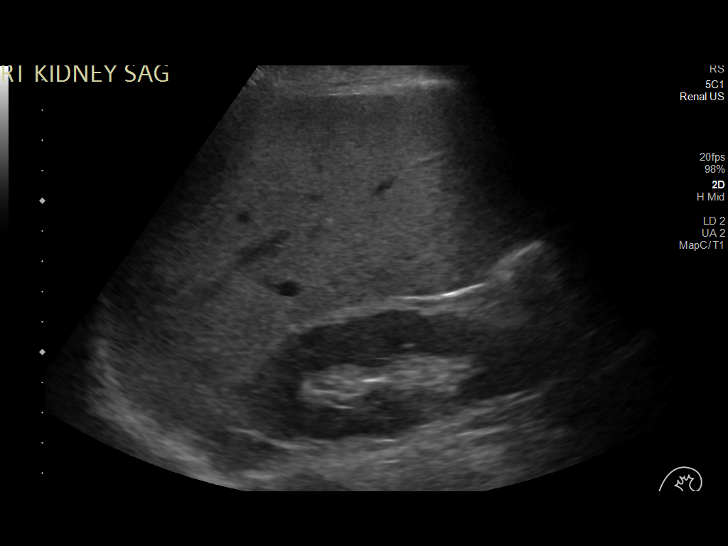
[im 5/50]
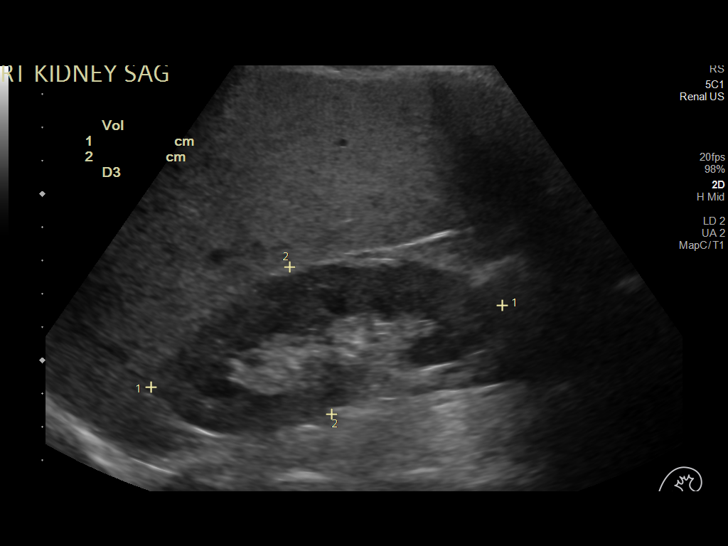
[im 9/50]
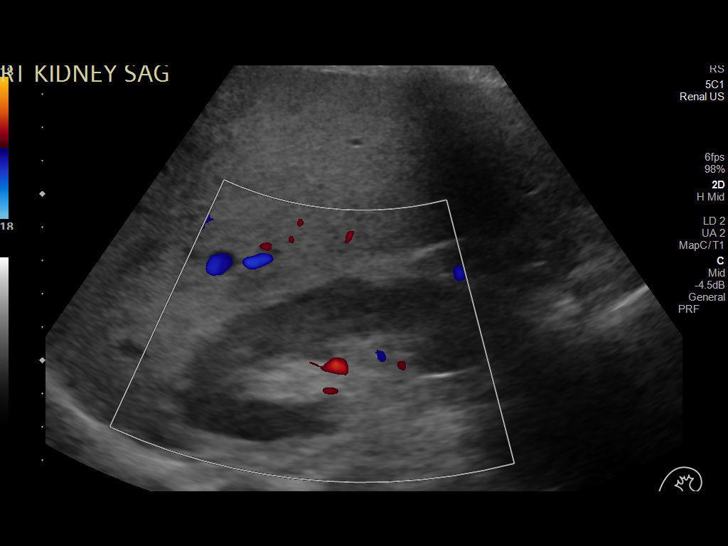
[im 13/50]
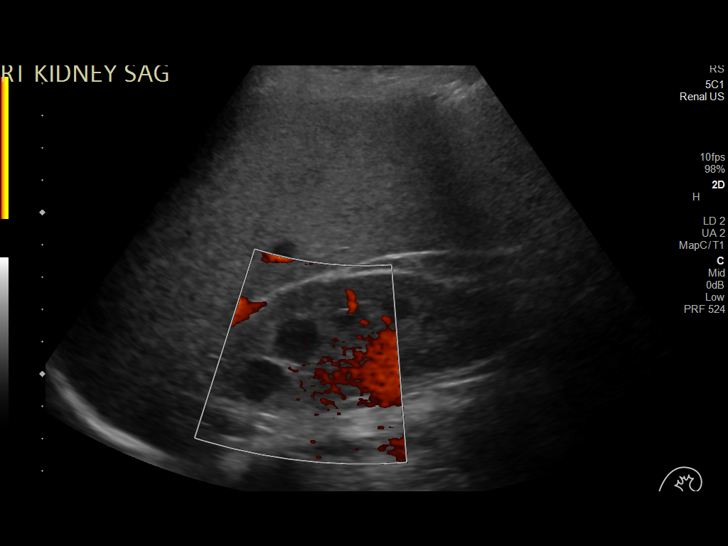
[im 17/50]
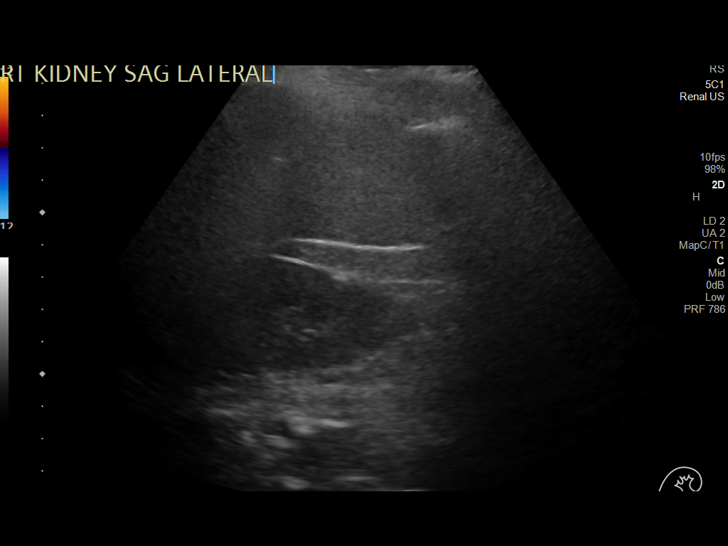
[im 19/50]
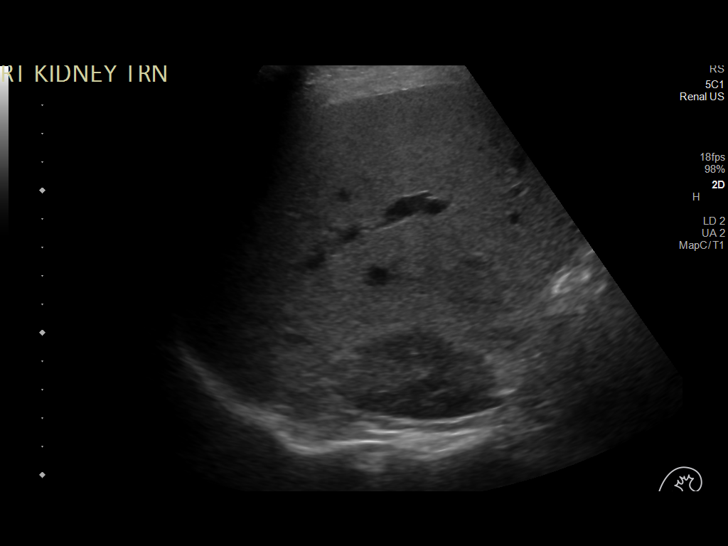
[im 23/50]
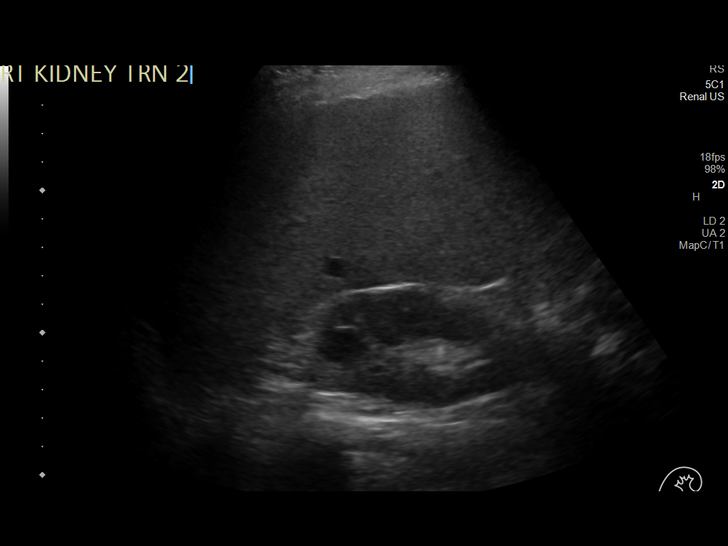
[im 27/50]
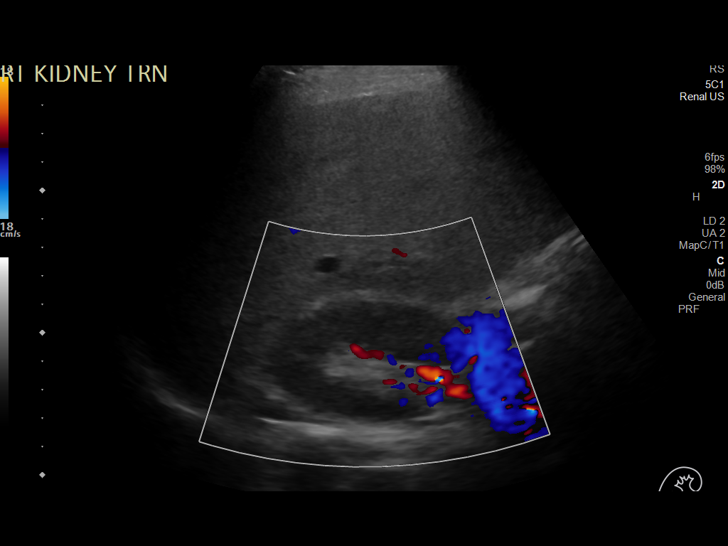
[im 31/50]
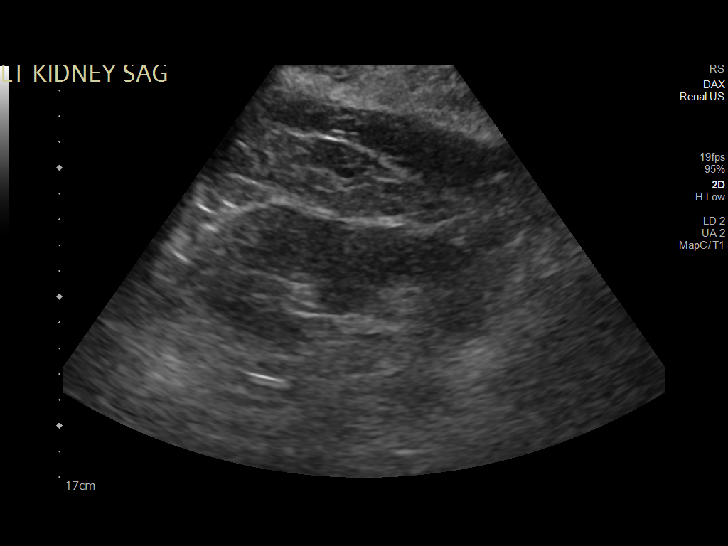
[im 33/50]
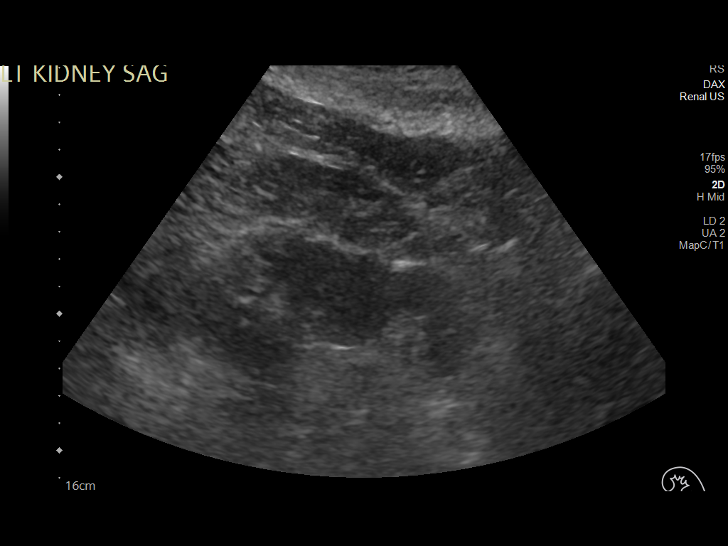
[im 37/50]
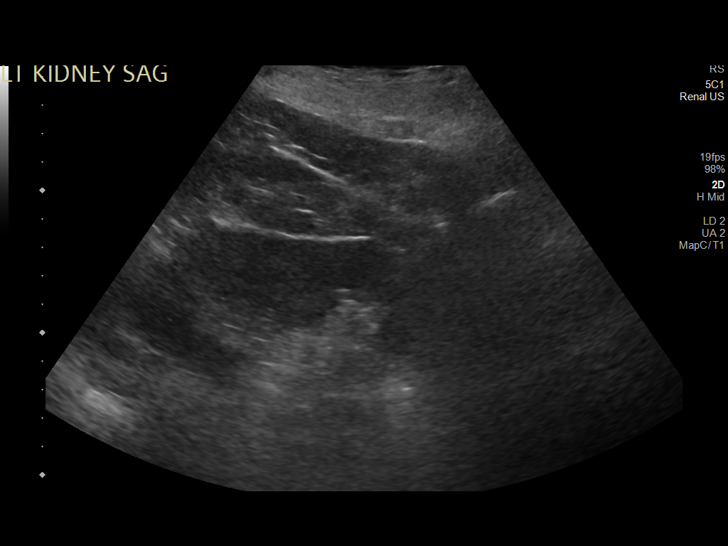
[im 41/50]
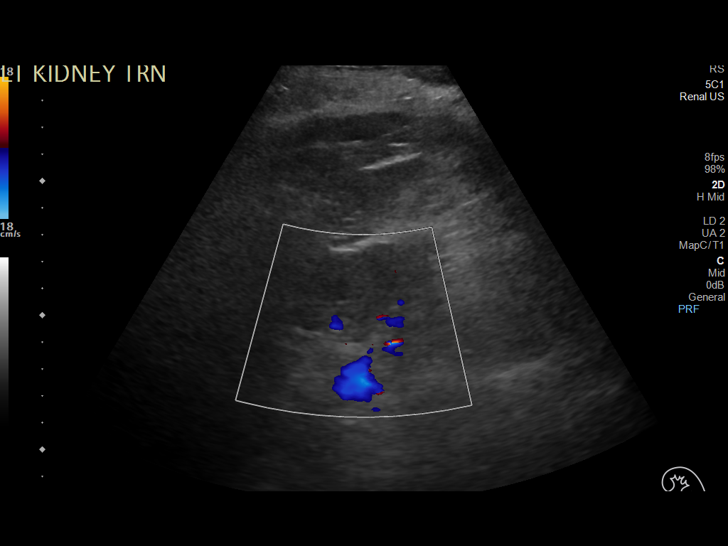
[im 45/50]
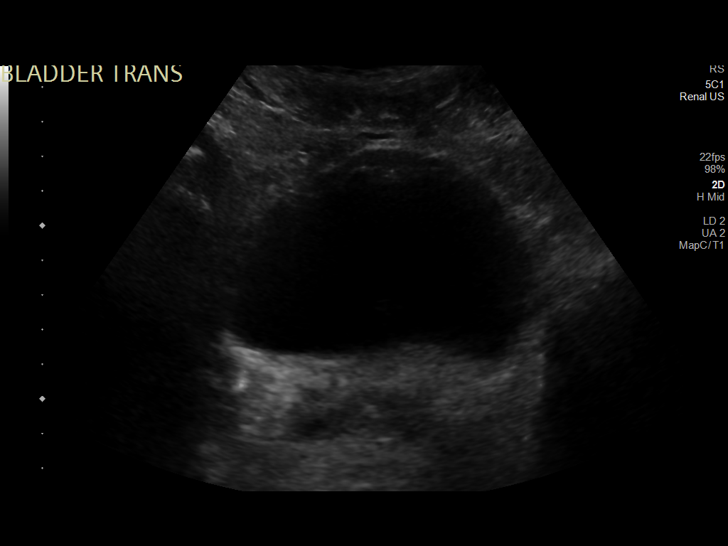
[im 50/50]
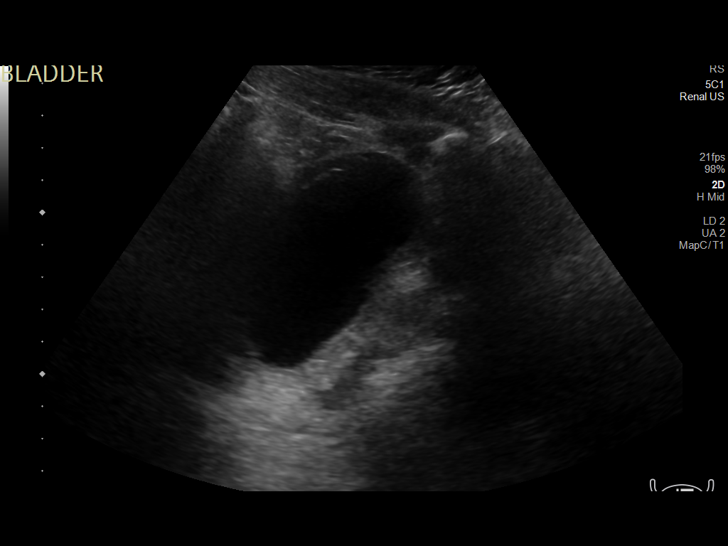

[14 of 25 positions shown; findings below may reference images not displayed]

FINDINGS: Right Kidney:

Renal measurements: 10.8 x 4.6 x 6.7 cm = volume: 176 mL.
Echogenicity within normal limits. No solid mass or hydronephrosis
visualized. Two adjacent upper pole cysts measuring 1.6 cm and
cm.

Left Kidney:

Renal measurements: 11.6 x 5.8 x 5.2 cm = volume: 185 mL.
Echogenicity within normal limits. No mass or hydronephrosis
identified although the kidney with suboptimally visualized due to
patient body habitus.

Bladder:

Appears normal for degree of bladder distention.

Other:

None.
IMPRESSION: 1. No hydronephrosis.
2. Two small right renal cysts.

## 2020-07-20 MED ORDER — POTASSIUM CHLORIDE CRYS ER 20 MEQ PO TBCR
40.0000 meq | EXTENDED_RELEASE_TABLET | Freq: Once | ORAL | Status: AC
  Administered 2020-07-20: 40 meq via ORAL
  Filled 2020-07-20: qty 2

## 2020-07-20 MED ORDER — HEPARIN SODIUM (PORCINE) 5000 UNIT/ML IJ SOLN
5000.0000 [IU] | Freq: Three times a day (TID) | INTRAMUSCULAR | Status: DC
  Administered 2020-07-20 – 2020-07-31 (×35): 5000 [IU] via SUBCUTANEOUS
  Filled 2020-07-20 (×35): qty 1

## 2020-07-20 MED ORDER — LORAZEPAM 2 MG/ML IJ SOLN
1.0000 mg | INTRAMUSCULAR | Status: AC | PRN
  Administered 2020-07-21: 1 mg via INTRAVENOUS
  Administered 2020-07-22: 2 mg via INTRAVENOUS
  Administered 2020-07-22: 1 mg via INTRAVENOUS
  Filled 2020-07-20 (×2): qty 1

## 2020-07-20 MED ORDER — CARVEDILOL 12.5 MG PO TABS: 6.2500 mg | ORAL_TABLET | Freq: Two times a day (BID) | ORAL | Status: DC

## 2020-07-20 MED ORDER — VITAMIN B-12 1000 MCG PO TABS
1000.0000 ug | ORAL_TABLET | Freq: Every day | ORAL | Status: DC
  Administered 2020-07-20 – 2020-07-21 (×2): 1000 ug via ORAL
  Filled 2020-07-20 (×2): qty 1

## 2020-07-20 MED ORDER — ASPIRIN 81 MG PO CHEW
81.0000 mg | CHEWABLE_TABLET | Freq: Every day | ORAL | Status: DC
  Administered 2020-07-20 – 2020-07-31 (×12): 81 mg via ORAL
  Filled 2020-07-20 (×12): qty 1

## 2020-07-20 MED ORDER — STERILE WATER FOR INJECTION IV SOLN
INTRAVENOUS | Status: DC
  Filled 2020-07-20: qty 850

## 2020-07-20 MED ORDER — ENSURE ENLIVE PO LIQD
237.0000 mL | Freq: Two times a day (BID) | ORAL | Status: DC
  Administered 2020-07-20 – 2020-07-21 (×2): 237 mL via ORAL

## 2020-07-20 MED ORDER — CHLORDIAZEPOXIDE HCL 10 MG PO CAPS
10.0000 mg | ORAL_CAPSULE | Freq: Three times a day (TID) | ORAL | Status: DC
  Filled 2020-07-20: qty 1

## 2020-07-20 MED ORDER — ADULT MULTIVITAMIN W/MINERALS CH
1.0000 | ORAL_TABLET | Freq: Every day | ORAL | Status: DC
  Administered 2020-07-20 – 2020-07-31 (×12): 1 via ORAL
  Filled 2020-07-20 (×12): qty 1

## 2020-07-20 MED ORDER — FOLIC ACID 1 MG PO TABS
1.0000 mg | ORAL_TABLET | Freq: Every day | ORAL | Status: DC
  Administered 2020-07-20 – 2020-07-31 (×12): 1 mg via ORAL
  Filled 2020-07-20 (×12): qty 1

## 2020-07-20 MED ORDER — SODIUM BICARBONATE 650 MG PO TABS
650.0000 mg | ORAL_TABLET | Freq: Three times a day (TID) | ORAL | Status: DC
  Administered 2020-07-20 – 2020-07-22 (×7): 650 mg via ORAL
  Filled 2020-07-20 (×9): qty 1

## 2020-07-20 MED ORDER — THIAMINE HCL 100 MG/ML IJ SOLN
100.0000 mg | Freq: Every day | INTRAMUSCULAR | Status: DC
  Administered 2020-07-20 – 2020-07-31 (×12): 100 mg via INTRAVENOUS
  Filled 2020-07-20 (×12): qty 2

## 2020-07-20 MED ORDER — LORAZEPAM 1 MG PO TABS
1.0000 mg | ORAL_TABLET | ORAL | Status: AC | PRN
  Administered 2020-07-22: 1 mg via ORAL
  Filled 2020-07-20: qty 1

## 2020-07-20 MED ORDER — LACTATED RINGERS IV SOLN: INTRAVENOUS | Status: DC

## 2020-07-20 MED ORDER — TAMSULOSIN HCL 0.4 MG PO CAPS
0.4000 mg | ORAL_CAPSULE | Freq: Every day | ORAL | Status: DC
  Administered 2020-07-20 – 2020-07-23 (×4): 0.4 mg via ORAL
  Filled 2020-07-20 (×4): qty 1

## 2020-07-20 MED ORDER — PANTOPRAZOLE SODIUM 40 MG PO TBEC
40.0000 mg | DELAYED_RELEASE_TABLET | Freq: Every day | ORAL | Status: DC
  Administered 2020-07-20 – 2020-07-31 (×12): 40 mg via ORAL
  Filled 2020-07-20 (×12): qty 1

## 2020-07-20 MED ORDER — HYDROCERIN EX CREA
TOPICAL_CREAM | Freq: Two times a day (BID) | CUTANEOUS | Status: DC
  Administered 2020-07-22: 1 via TOPICAL
  Filled 2020-07-20 (×2): qty 113

## 2020-07-20 MED ORDER — THIAMINE HCL 100 MG PO TABS: 100.0000 mg | ORAL_TABLET | Freq: Every day | ORAL | Status: DC

## 2020-07-20 MED ORDER — NICOTINE 14 MG/24HR TD PT24
14.0000 mg | MEDICATED_PATCH | Freq: Every day | TRANSDERMAL | Status: DC
  Administered 2020-07-22 – 2020-07-31 (×10): 14 mg via TRANSDERMAL
  Filled 2020-07-20 (×12): qty 1

## 2020-07-20 MED ORDER — CHLORDIAZEPOXIDE HCL 5 MG PO CAPS
10.0000 mg | ORAL_CAPSULE | Freq: Three times a day (TID) | ORAL | Status: DC
  Administered 2020-07-20 – 2020-07-23 (×10): 10 mg via ORAL
  Filled 2020-07-20 (×10): qty 2

## 2020-07-20 MED ORDER — CARVEDILOL 3.125 MG PO TABS
3.1250 mg | ORAL_TABLET | Freq: Two times a day (BID) | ORAL | Status: DC
  Filled 2020-07-20: qty 1

## 2020-07-20 NOTE — ED Notes (Signed)
Phlebotomy informs this nurse, that pt is refusing to be stuck anymore for labs at this time, this nurse informed attending Candiss Norse MD. Will continue to monitor.
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
## 2020-07-20 NOTE — ED Notes (Signed)
RN aware of CBG of 58, orange juice given

## 2020-07-20 NOTE — ED Notes (Signed)
hospitalist at bedside

## 2020-07-20 NOTE — Progress Notes (Signed)
PROGRESS NOTE                                                                                                                                                                                                             Patient Demographics:    Seth [name], is a 64 y.o. male, DOB - 1955-09-13, ZOX:[id number]  Outpatient Primary MD for the patient is Lucianne Lei, MD    LOS - 1  Admit date - 07/19/2020    Chief Complaint  Patient presents with   Headache       Brief Narrative (HPI from H&P)  Seth [name] is a 64 y.o. male with medical history significant of depression, BPH, gout, hypertension, stroke, alcohol dependence (drinks 1/2 pint of vodka daily and has been drinking for several years), tobacco use, marijuana use, COPD presenting with presyncope, in the ER found to have dehydration, hypotension, tachycardia, AKI, lactic acidosis and was admitted to the hospital.   Subjective:    Seth [name] today has, No headache, No chest pain, No abdominal pain - No Nausea, No new weakness tingling or numbness, no cough or shortness of breath.   Assessment  & Plan :      1.  Severe dehydration, AKI, starvation ketosis, lactic acidosis caused by dehydration and AKI, all causing metabolic acidosis.  He will be hydrated, urine sample for ketones will be checked, oral bicarb continue, check UA along with osmolality, oral diet and monitor orthostatics and clinically.  Check renal ultrasound, lactic acid, beta hydroxybutyrate along with urine ketones.  2.  Severe alcohol abuse, marijuana abuse, smoking.  Very high risk for going into DTs, counseled to quit, Librium scheduled along with CIWA protocol   3.  History of hypertension.  Currently blood pressure is soft due to dehydration.  Monitor.  4.  COPD.  At baseline no acute issues.  5.  History of CVA.  Will place on aspirin.  6. GERD - PPI.  7.  Hypokalemia.   Replaced.  8.  BPH.  Monitor bladder scan and place on Flomax.     Condition -  Guarded  Family Communication  : None, patient will update his extended family, he lives alone.  Code Status :  Full  Consults  :  None  Procedures  :    CT head.  Nonacute.  Renal  ultrasound  PUD Prophylaxis : PPI  Disposition Plan  :    Status is: Inpatient  Remains inpatient appropriate because:IV treatments appropriate due to intensity of illness or inability to take PO   Dispo: The patient is from: Home              Anticipated d/c is to: Home              Anticipated d/c date is: 3 days              Patient currently is not medically stable to d/c.  DVT Prophylaxis  :    Heparin   Lab Results  Component Value Date   PLT 296 07/19/2020    Diet :  Diet Order            Diet Carb Modified Fluid consistency: Thin; Room service appropriate? Yes  Diet effective now                  Inpatient Medications  Scheduled Meds:  [START ON 07/21/2020] carvedilol  3.125 mg Oral BID   chlordiazePOXIDE  10 mg Oral TID   folic acid  1 mg Oral Daily   heparin  5,000 Units Subcutaneous Q8H   multivitamin with minerals  1 tablet Oral Daily   nicotine  14 mg Transdermal Daily   pantoprazole  40 mg Oral Daily   potassium chloride  40 mEq Oral Once   sodium bicarbonate  650 mg Oral TID   thiamine  100 mg Intravenous Daily   vitamin B-12  1,000 mcg Oral Daily   Continuous Infusions:  lactated ringers     PRN Meds:.LORazepam **OR** LORazepam  Antibiotics  :    Anti-infectives (From admission, onward)   None       Time Spent in minutes  30   [name] M.D on 07/20/2020 at 9:00 AM  To page go to [email or web address] - password [facility]  [facility] -  Office  [phone]  See all Orders from today for further details    Objective:   Vitals:   07/20/20 0400 07/20/20 0500 07/20/20 0600 07/20/20 0700  BP: 115/77 130/83 122/74 117/84  Pulse: 92 88 92 93   Resp: 20 17 19 14   Temp:      TempSrc:      SpO2: 100% 100% 100% 100%    Wt Readings from Last 3 Encounters:  06/04/19 64.1 kg  02/24/19 68.1 kg  05/08/18 69.9 kg     Intake/Output Summary (Last 24 hours) at 07/20/2020 0900 Last data filed at 07/20/2020 0212 Gross per 24 hour  Intake 50 ml  Output --  Net 50 ml     Physical Exam  Awake Alert, No new F.N deficits, Normal affect .AT,PERRAL Supple Neck,No JVD, No cervical lymphadenopathy appriciated.  Symmetrical Chest wall movement, Good air movement bilaterally, CTAB RRR,No Gallops,Rubs or new Murmurs, No Parasternal Heave +ve B.Sounds, Abd Soft, No tenderness, No organomegaly appriciated, No rebound - guarding or rigidity. No Cyanosis, Clubbing or edema, No new Rash or bruise      Data Review:    CBC Recent Labs  Lab 07/19/20 1329 07/19/20 2250 07/20/20 0240  WBC 7.9  --   --   HGB 9.9* 11.2* 9.9*  HCT 31.2* 33.0* 29.0*  PLT 296  --   --   MCV 117.3*  --   --   MCH 37.2*  --   --   MCHC 31.7  --   --  RDW 17.4*  --   --     Recent Labs  Lab 07/19/20 1329 07/19/20 2244 07/19/20 2250 07/20/20 0032 07/20/20 0212 07/20/20 0240 07/20/20 0741 07/20/20 0758  NA 134*  --  132*  --   --  129*  --  134*  K 3.1*  --  3.6  --   --  3.0*  --  3.3*  CL 95*  --   --   --   --   --   --  90*  CO2 11*  --   --   --   --   --   --  13*  GLUCOSE 54*  --   --   --   --   --   --  80  BUN 19  --   --   --   --   --   --  23  CREATININE 1.83*  --   --   --   --   --   --  2.02*  CALCIUM 9.3  --   --   --   --   --   --  8.7*  MG  --  2.0  --   --   --   --   --   --   DDIMER  --   --   --   --  0.58*  --   --   --   LATICACIDVEN  --  2.2*  --  2.1*  --   --   --   --   HGBA1C  --   --   --   --   --   --  4.2*  --     ------------------------------------------------------------------------------------------------------------------ No results for input(s): CHOL, HDL, LDLCALC, TRIG, CHOLHDL, LDLDIRECT in the  last 72 hours.  Lab Results  Component Value Date   HGBA1C 4.2 (L) 07/20/2020   ------------------------------------------------------------------------------------------------------------------ No results for input(s): TSH, T4TOTAL, T3FREE, THYROIDAB in the last 72 hours.  Invalid input(s): FREET3  Cardiac Enzymes No results for input(s): CKMB, TROPONINI, MYOGLOBIN in the last 168 hours.  Invalid input(s): CK ------------------------------------------------------------------------------------------------------------------    Component Value Date/Time   BNP 39.3 05/31/2019 1345    Micro Results Recent Results (from the past 240 hour(s))  Respiratory Panel by RT PCR (Flu A&B, Covid) - Nasopharyngeal Swab     Status: None   Collection Time: 07/19/20 11:07 PM   Specimen: Nasopharyngeal Swab; Nasopharyngeal(NP) swabs in vial transport medium  Result Value Ref Range Status   SARS Coronavirus 2 by RT PCR NEGATIVE NEGATIVE Final    Comment: (NOTE) SARS-CoV-2 target nucleic acids are NOT DETECTED.  The SARS-CoV-2 RNA is generally detectable in upper respiratoy specimens during the acute phase of infection. The lowest concentration of SARS-CoV-2 viral copies this assay can detect is 131 copies/mL. A negative result does not preclude SARS-Cov-2 infection and should not be used as the sole basis for treatment or other patient management decisions. A negative result may occur with  improper specimen collection/handling, submission of specimen other than nasopharyngeal swab, presence of viral mutation(s) within the areas targeted by this assay, and inadequate number of viral copies (<131 copies/mL). A negative result must be combined with clinical observations, patient history, and epidemiological information. The expected result is Negative.  Fact Sheet for Patients:  PinkCheek.be  Fact Sheet for Healthcare Providers:   GravelBags.it  This test is no t yet approved or cleared by the [place] FDA and  has been  authorized for detection and/or diagnosis of SARS-CoV-2 by FDA under an Emergency Use Authorization (EUA). This EUA will remain  in effect (meaning this test can be used) for the duration of the COVID-19 declaration under Section 564(b)(1) of the Act, 21 U.S.C. section 360bbb-3(b)(1), unless the authorization is terminated or revoked sooner.     Influenza A by PCR NEGATIVE NEGATIVE Final   Influenza B by PCR NEGATIVE NEGATIVE Final    Comment: (NOTE) The Xpert Xpress SARS-CoV-2/FLU/RSV assay is intended as an aid in  the diagnosis of influenza from Nasopharyngeal swab specimens and  should not be used as a sole basis for treatment. Nasal washings and  aspirates are unacceptable for Xpert Xpress SARS-CoV-2/FLU/RSV  testing.  Fact Sheet for Patients: PinkCheek.be  Fact Sheet for Healthcare Providers: GravelBags.it  This test is not yet approved or cleared by the [place] FDA and  has been authorized for detection and/or diagnosis of SARS-CoV-2 by  FDA under an Emergency Use Authorization (EUA). This EUA will remain  in effect (meaning this test can be used) for the duration of the  Covid-19 declaration under Section 564(b)(1) of the Act, 21  U.S.C. section 360bbb-3(b)(1), unless the authorization is  terminated or revoked. Performed at [facility], [place] [address] [place],  [address]     Radiology Reports CT Head Wo Contrast  Result Date: 07/19/2020 CLINICAL DATA:  Dizziness EXAM: CT HEAD WITHOUT CONTRAST TECHNIQUE: Contiguous axial images were obtained from the base of the skull through the vertex without intravenous contrast. COMPARISON:  None. FINDINGS: Brain: There is periventricular hypoattenuation compatible with chronic microvascular disease. No extra-axial collection or  intracranial hemorrhage. No midline shift or other mass effect. Generalized volume loss. Old bilateral cerebellar infarcts. Vascular: Internal carotid artery atherosclerosis. Skull: Normal. Negative for fracture or focal lesion. Sinuses/Orbits: No acute finding. Other: None. IMPRESSION: 1. No acute intracranial abnormality. 2. Old bilateral cerebellar infarcts and chronic microvascular ischemia. Electronically Signed   By: Ulyses Jarred M.D.   On: 07/19/2020 22:07   DG Chest Portable 1 View  Result Date: 07/19/2020 CLINICAL DATA:  Shortness of breath, headache on off for 2 weeks. EXAM: PORTABLE CHEST 1 VIEW COMPARISON:  Chest x-ray 05/31/2019, CT chest 02/19/2019 FINDINGS: The heart size and mediastinal contours are within normal limits. Aortic arch calcifications. No focal consolidation. No pulmonary edema. Similar-appearing blunting of the right costophrenic angle. No pleural effusion. No pneumothorax. No acute osseous abnormality. IMPRESSION: No acute cardiopulmonary disease. Electronically Signed   By: Iven Finn M.D.   On: 07/19/2020 21:52

## 2020-07-20 NOTE — ED Notes (Signed)
Lunch Tray Ordered @ 1003. 

## 2020-07-20 NOTE — Progress Notes (Signed)
Echocardiogram 2D Echocardiogram has been performed.  Oneal Deputy Javonne Louissaint 07/20/2020, 11:11 AM

## 2020-07-20 NOTE — Evaluation (Signed)
Physical Therapy Evaluation Patient Details Name: Seth [name] MRN: [id number] DOB: Jan 18, 1956  Today's Date: 07/20/2020   History of Present Illness  64 yo male with onset of dizziness and SOB with HA's was admitted.  Found AKI, orthostatic BP's, low BS and low K+.  PMHx:  c-spine fusion, gout, HTN, EtOH abuse, COPD, B cerebellar strokes, atherosclerosisl  Clinical Impression  Pt was assessed for orthostatics:  Supine 108/66, pulse 95 and sat 100%;  Sitting 102/66, pulse 98 and sats 100%;  Standing 73/51, pulse 111 and sat 100%.  Pt is unable to walk but will move toward more mobility as his medical condition permits.  Pt is motivated and expecting to try to work toward more direct DC home.    Follow Up Recommendations SNF    Equipment Recommendations  None recommended by PT    Recommendations for Other Services       Precautions / Restrictions Precautions Precautions: Fall Precaution Comments: dehydration, low intake Restrictions Weight Bearing Restrictions: No      Mobility  Bed Mobility Overal bed mobility: Needs Assistance Bed Mobility: Supine to Sit;Sit to Supine;Rolling Rolling: Supervision   Supine to sit: Min assist Sit to supine: Min assist        Transfers Overall transfer level: Needs assistance Equipment used: 1 person hand held assist Transfers: Sit to/from [facility] Sit to Stand: Min guard Stand pivot transfers: Min guard       General transfer comment: cues for safety of hand placement and adjustment of height of transfer  Ambulation/Gait             General Gait Details: unable  Stairs            Wheelchair Mobility    Modified Rankin (Stroke Patients Only)       Balance Overall balance assessment: Needs assistance Sitting-balance support: Feet supported Sitting balance-[name] Scale: Fair     Standing balance support: Bilateral upper extremity supported;During functional activity Standing balance-[name]  Scale: Poor                               Pertinent Vitals/Pain Pain Assessment: Faces Faces Pain Scale: Hurts a little bit Pain Descriptors / Indicators: Discomfort Pain Intervention(s): Monitored during session;Repositioned;Limited activity within patient's tolerance    Home Living Family/patient expects to be discharged to:: Private residence Living Arrangements: Alone Available Help at Discharge: Family;Available 24 hours/day Type of Home: Apartment Home Access: Level entry     Home Layout: One level Home Equipment: Brickel - 2 wheels      Prior Function Level of Independence: Independent         Comments: pt has lost a substantial amount of weight in the last few weeks     Hand Dominance   Dominant Hand: Right    Extremity/Trunk Assessment   Upper Extremity Assessment Upper Extremity Assessment: Generalized weakness    Lower Extremity Assessment Lower Extremity Assessment: Generalized weakness    Cervical / Trunk Assessment Cervical / Trunk Assessment: Normal  Communication   Communication: No difficulties  Cognition Arousal/Alertness: Awake/alert Behavior During Therapy: WFL for tasks assessed/performed Overall Cognitive Status: Within Functional Limits for tasks assessed                                 General Comments: pt is mildly uncomfortable but trying to move  General Comments General comments (skin integrity, edema, etc.): Pt is orthostatic in all postures but esp standing    Exercises     Assessment/Plan    PT Assessment Patient needs continued PT services  PT Problem List Decreased strength;Decreased range of motion;Decreased activity tolerance;Decreased balance;Decreased mobility;Decreased coordination;Decreased knowledge of use of DME;Decreased safety awareness;Cardiopulmonary status limiting activity       PT Treatment Interventions DME instruction;Gait training;Functional mobility  training;Therapeutic activities;Therapeutic exercise;Balance training;Neuromuscular re-education;Patient/family education    PT Goals (Current goals can be found in the Care Plan section)  Acute Rehab PT Goals Patient Stated Goal: to feel better PT Goal Formulation: With patient Time For Goal Achievement: 08/03/20 Potential to Achieve Goals: Good    Frequency Min 3X/week   Barriers to discharge Decreased caregiver support limited help and at higher fall riks    Co-evaluation               AM-PAC PT "6 Clicks" Mobility  Outcome Measure Help needed turning from your back to your side while in a flat bed without using bedrails?: A Little Help needed moving from lying on your back to sitting on the side of a flat bed without using bedrails?: A Little Help needed moving to and from a bed to a chair (including a wheelchair)?: A Little Help needed standing up from a chair using your arms (e.g., wheelchair or bedside chair)?: A Little Help needed to walk in hospital room?: A Little Help needed climbing 3-5 steps with a railing? : Total 6 Click Score: 16    End of Session Equipment Utilized During Treatment: Gait belt Activity Tolerance: Patient limited by fatigue;Treatment limited secondary to medical complications (Comment) Patient left: in bed;with call bell/phone within reach;with bed alarm set;with nursing/sitter in room Nurse Communication: Mobility status PT Visit Diagnosis: Unsteadiness on feet (R26.81);Muscle weakness (generalized) (M62.81);Difficulty in walking, not elsewhere classified (R26.2)    Time: 0272-5366 PT Time Calculation (min) (ACUTE ONLY): 32 min   Charges:   PT Evaluation $PT Eval Moderate Complexity: 1 Mod PT Treatments $Therapeutic Activity: 8-22 mins       Ramond Dial 07/20/2020, 8:35 PM  Mee Hives, PT MS Acute Rehab Dept. Number: [place] [facility] [place]

## 2020-07-20 NOTE — ED Notes (Signed)
Assumed care of patient.

## 2020-07-20 NOTE — Plan of Care (Signed)

## 2020-07-20 NOTE — ED Notes (Signed)
Orthostatic VS obtained, pt has positive orthostatics, this nurse notified EDP Singh of VS obtained.

## 2020-07-20 NOTE — ED Notes (Signed)
ECHO at bedside.

## 2020-07-21 DIAGNOSIS — E872 Acidosis: Secondary | ICD-10-CM | POA: Diagnosis not present

## 2020-07-21 LAB — RETICULOCYTES
Immature Retic Fract: 31 % — ABNORMAL HIGH (ref 2.3–15.9)
RBC.: 2.42 MIL/uL — ABNORMAL LOW (ref 4.22–5.81)
Retic Count, Absolute: 86.9 10*3/uL (ref 19.0–186.0)
Retic Ct Pct: 3.6 % — ABNORMAL HIGH (ref 0.4–3.1)

## 2020-07-21 LAB — COMPREHENSIVE METABOLIC PANEL
ALT: 9 U/L (ref 0–44)
AST: 17 U/L (ref 15–41)
Albumin: 2.8 g/dL — ABNORMAL LOW (ref 3.5–5.0)
Alkaline Phosphatase: 68 U/L (ref 38–126)
Anion gap: 11 (ref 5–15)
BUN: 13 mg/dL (ref 8–23)
CO2: 29 mmol/L (ref 22–32)
Calcium: 8.7 mg/dL — ABNORMAL LOW (ref 8.9–10.3)
Chloride: 98 mmol/L (ref 98–111)
Creatinine, Ser: 1.31 mg/dL — ABNORMAL HIGH (ref 0.61–1.24)
GFR, Estimated: >60 mL/min (ref >60)
Glucose, Bld: 118 mg/dL — ABNORMAL HIGH (ref 70–99)
Potassium: 3.4 mmol/L — ABNORMAL LOW (ref 3.5–5.1)
Sodium: 138 mmol/L (ref 135–145)
Total Bilirubin: 0.7 mg/dL (ref 0.3–1.2)
Total Protein: 5.3 g/dL — ABNORMAL LOW (ref 6.5–8.1)

## 2020-07-21 LAB — CBC WITH DIFFERENTIAL/PLATELET
Abs Immature Granulocytes: 0.11 10*3/uL — ABNORMAL HIGH (ref 0.00–0.07)
Basophils Absolute: 0 10*3/uL (ref 0.0–0.1)
Basophils Relative: 0 %
Eosinophils Absolute: 0 10*3/uL (ref 0.0–0.5)
Eosinophils Relative: 1 %
HCT: 23.8 % — ABNORMAL LOW (ref 39.0–52.0)
Hemoglobin: 8 g/dL — ABNORMAL LOW (ref 13.0–17.0)
Immature Granulocytes: 2 %
Lymphocytes Relative: 19 %
Lymphs Abs: 1.1 10*3/uL (ref 0.7–4.0)
MCH: 36.2 pg — ABNORMAL HIGH (ref 26.0–34.0)
MCHC: 33.6 g/dL (ref 30.0–36.0)
MCV: 107.7 fL — ABNORMAL HIGH (ref 80.0–100.0)
Monocytes Absolute: 1.7 10*3/uL — ABNORMAL HIGH (ref 0.1–1.0)
Monocytes Relative: 30 %
Neutro Abs: 2.8 10*3/uL (ref 1.7–7.7)
Neutrophils Relative %: 48 %
Platelets: 310 10*3/uL (ref 150–400)
RBC: 2.21 MIL/uL — ABNORMAL LOW (ref 4.22–5.81)
RDW: 16.9 % — ABNORMAL HIGH (ref 11.5–15.5)
WBC: 5.8 10*3/uL (ref 4.0–10.5)
nRBC: 2.9 % — ABNORMAL HIGH (ref 0.0–0.2)

## 2020-07-21 LAB — IRON AND TIBC
Iron: 28 ug/dL — ABNORMAL LOW (ref 45–182)
Saturation Ratios: 15 % — ABNORMAL LOW (ref 17.9–39.5)
TIBC: 182 ug/dL — ABNORMAL LOW (ref 250–450)
UIBC: 154 ug/dL

## 2020-07-21 LAB — GLUCOSE, CAPILLARY
Glucose-Capillary: 99 mg/dL (ref 70–99)
Glucose-Capillary: 111 mg/dL — ABNORMAL HIGH (ref 70–99)
Glucose-Capillary: 105 mg/dL — ABNORMAL HIGH (ref 70–99)
Glucose-Capillary: 100 mg/dL — ABNORMAL HIGH (ref 70–99)

## 2020-07-21 LAB — FOLATE: Folate: 5.3 ng/mL — ABNORMAL LOW (ref >5.9)

## 2020-07-21 LAB — TYPE AND SCREEN
ABO/RH(D): O POS
Antibody Screen: NEGATIVE

## 2020-07-21 LAB — MAGNESIUM: Magnesium: 2 mg/dL (ref 1.7–2.4)

## 2020-07-21 LAB — FERRITIN: Ferritin: 604 ng/mL — ABNORMAL HIGH (ref 24–336)

## 2020-07-21 LAB — ABO/RH: ABO/RH(D): O POS

## 2020-07-21 LAB — VITAMIN B12: Vitamin B-12: 325 pg/mL (ref 180–914)

## 2020-07-21 LAB — BETA-HYDROXYBUTYRIC ACID: Beta-Hydroxybutyric Acid: 1.19 mmol/L — ABNORMAL HIGH (ref 0.05–0.27)

## 2020-07-21 LAB — LACTIC ACID, PLASMA: Lactic Acid, Venous: 1 mmol/L (ref 0.5–1.9)

## 2020-07-21 LAB — BRAIN NATRIURETIC PEPTIDE: B Natriuretic Peptide: 109.2 pg/mL — ABNORMAL HIGH (ref 0.0–100.0)

## 2020-07-21 MED ORDER — FERROUS SULFATE 325 (65 FE) MG PO TABS
325.0000 mg | ORAL_TABLET | Freq: Two times a day (BID) | ORAL | Status: DC
  Administered 2020-07-21 – 2020-07-31 (×20): 325 mg via ORAL
  Filled 2020-07-21 (×20): qty 1

## 2020-07-21 MED ORDER — LACTATED RINGERS IV SOLN: INTRAVENOUS | Status: AC

## 2020-07-21 MED ORDER — POTASSIUM CHLORIDE CRYS ER 20 MEQ PO TBCR
40.0000 meq | EXTENDED_RELEASE_TABLET | Freq: Once | ORAL | Status: AC
  Administered 2020-07-21: 40 meq via ORAL
  Filled 2020-07-21: qty 2

## 2020-07-21 MED ORDER — CARVEDILOL 3.125 MG PO TABS: 3.1250 mg | ORAL_TABLET | Freq: Two times a day (BID) | ORAL | Status: DC

## 2020-07-21 MED ORDER — CYANOCOBALAMIN 1000 MCG/ML IJ SOLN
1000.0000 ug | Freq: Every day | INTRAMUSCULAR | Status: AC
  Administered 2020-07-21 – 2020-07-27 (×7): 1000 ug via INTRAMUSCULAR
  Filled 2020-07-21 (×7): qty 1

## 2020-07-21 MED ORDER — ENSURE ENLIVE PO LIQD
237.0000 mL | Freq: Three times a day (TID) | ORAL | Status: DC
  Administered 2020-07-21 – 2020-07-31 (×26): 237 mL via ORAL
  Filled 2020-07-21: qty 237

## 2020-07-21 NOTE — Evaluation (Signed)
Occupational Therapy Evaluation Patient Details Name: Seth [name] MRN: [id number] DOB: 1956-07-22 Today's Date: 07/21/2020    History of Present Illness 64 yo male with onset of dizziness and SOB with HA's was admitted.  Found AKI, orthostatic BP's, low BS and low K+.  PMHx:  c-spine fusion, gout, HTN, EtOH abuse, COPD, B cerebellar strokes, atherosclerosisl   Clinical Impression   Pt PTA: Pt living at home alone and per chart, not able to take care of self for diet and medications. Pt currently performing ADL with minA +2 due to dizziness. Pt able to perform pericare in standing, but only able to remain standing x2 mins. Pt limited by decreased strength, poor activity tolerance, dizziness and decreased ability to properly care for self. Pt with + orthostatics at this time. 70/34 sitting on BSC and 115/76 when returned to supine. Pt would benefit from continued OT skilled services for ADL, mobility and safety in SNF setting. OT following acutely. HR <100 BPM; O2 >90% on RA.    Follow Up Recommendations  SNF;Supervision/Assistance - 24 hour    Equipment Recommendations  3 in 1 bedside commode;Wheelchair (measurements OT);Wheelchair cushion (measurements OT)    Recommendations for Other Services       Precautions / Restrictions Precautions Precautions: Fall Precaution Comments: dehydration, low intake Restrictions Weight Bearing Restrictions: No      Mobility Bed Mobility Overal bed mobility: Needs Assistance Bed Mobility: Supine to Sit;Sit to Supine;Rolling Rolling: Supervision   Supine to sit: Min assist Sit to supine: Min assist        Transfers Overall transfer level: Needs assistance Equipment used: 1 person hand held assist Transfers: Sit to/from [facility] Sit to Stand: Min guard Stand pivot transfers: Min guard       General transfer comment: cues for safety of hand placement and adjustment of height of transfer    Balance Overall  balance assessment: Needs assistance Sitting-balance support: Feet supported Sitting balance-[name] Scale: Fair     Standing balance support: Bilateral upper extremity supported;During functional activity Standing balance-[name] Scale: Poor                             ADL either performed or assessed with clinical judgement   ADL Overall ADL's : Needs assistance/impaired Eating/Feeding: Supervision/ safety;Sitting Eating/Feeding Details (indicate cue type and reason): education about sitting at EOB for meals Grooming: Supervision/safety;Sitting   Upper Body Bathing: Set up;Sitting   Lower Body Bathing: Moderate assistance;Sitting/lateral leans;Sit to/from stand Lower Body Bathing Details (indicate cue type and reason): assist due to dizziness Upper Body Dressing : Set up;Sitting   Lower Body Dressing: Moderate assistance;Sitting/lateral leans;Sit to/from stand   Toilet Transfer: Minimal assistance;Stand-pivot;BSC;+2 for physical assistance   Toileting- Clothing Manipulation and Hygiene: Minimal assistance;+2 for physical assistance;Sit to/from stand;Sitting/lateral lean Toileting - Clothing Manipulation Details (indicate cue type and reason): Pt able to perform pericare in standing, but pt reporting dizziness and only able to remain standing x2 mins.     Functional mobility during ADLs: Minimal assistance;+2 for physical assistance;+2 for safety/equipment;Cueing for safety;Rolling [place] General ADL Comments: Pt limited by decreased strength, poor activity tolerance, dizziness and decreased ability to properly care for self.     Vision Baseline Vision/History: Wears glasses Wears Glasses: At all times Patient Visual Report: No change from baseline Vision Assessment?: No apparent visual deficits     Perception     Praxis      Pertinent Vitals/Pain Pain Assessment:  Faces Faces Pain Scale: Hurts a little bit Pain Descriptors / Indicators: Discomfort Pain  Intervention(s): Monitored during session     Hand Dominance Right   Extremity/Trunk Assessment Upper Extremity Assessment Upper Extremity Assessment: Generalized weakness   Lower Extremity Assessment Lower Extremity Assessment: Generalized weakness   Cervical / Trunk Assessment Cervical / Trunk Assessment: Normal   Communication Communication Communication: No difficulties   Cognition Arousal/Alertness: Awake/alert Behavior During Therapy: WFL for tasks assessed/performed Overall Cognitive Status: Within Functional Limits for tasks assessed                                 General Comments: pt is mildly uncomfortable but trying to move   General Comments  Pt with + orthostatics at this time. 70/34 sitting on BSC and 115/76 when returned to supine.    Exercises Exercises:  (4+ RLE and 4- LLE)   Shoulder Instructions      Home Living Family/patient expects to be discharged to:: Private residence Living Arrangements: Alone Available Help at Discharge: Family;Available 24 hours/day Type of Home: Apartment Home Access: Level entry     Home Layout: One level         Bathroom Toilet: Standard     Home Equipment: Mumby - 2 wheels          Prior Functioning/Environment Level of Independence: Independent        Comments: Per H&P, pt unable to take proper care of self with medications/diet.        OT Problem List: Decreased strength;Decreased activity tolerance;Impaired balance (sitting and/or standing);Decreased knowledge of use of DME or AE;Cardiopulmonary status limiting activity;Pain;Increased edema      OT Treatment/Interventions: Self-care/ADL training;Therapeutic exercise;Energy conservation;DME and/or AE instruction;Therapeutic activities;Balance training;Patient/family education    OT Goals(Current goals can be found in the care plan section) Acute Rehab OT Goals Patient Stated Goal: to feel better OT Goal Formulation: With  patient Time For Goal Achievement: 08/04/20 Potential to Achieve Goals: Good ADL Goals Pt Will Perform Grooming: with modified independence;standing Pt Will Perform Lower Body Dressing: with supervision;sitting/lateral leans;sit to/from stand Pt Will Transfer to Toilet: with supervision;ambulating;regular height toilet Pt Will Perform Toileting - Clothing Manipulation and hygiene: with supervision;sitting/lateral leans;sit to/from stand Pt/caregiver will Perform Home Exercise Program: Increased strength;Both right and left upper extremity Additional ADL Goal #1: Pt will tolerate x10 mins of ADL and mobility with supervisionA overall in order to increase endurance for OOB ADL.  OT Frequency: Min 2X/week   Barriers to D/C:            Co-evaluation              AM-PAC OT "6 Clicks" Daily Activity     Outcome Measure Help from another person eating meals?: None Help from another person taking care of personal grooming?: A Little Help from another person toileting, which includes using toliet, bedpan, or urinal?: A Little Help from another person bathing (including washing, rinsing, drying)?: A Lot Help from another person to put on and taking off regular upper body clothing?: A Little Help from another person to put on and taking off regular lower body clothing?: A Lot 6 Click Score: 17   End of Session Equipment Utilized During Treatment: Rolling [place] Nurse Communication: Mobility status  Activity Tolerance: Patient tolerated treatment well Patient left: in bed;with call bell/phone within reach;with bed alarm set  OT Visit Diagnosis: Unsteadiness on feet (R26.81);Muscle weakness (generalized) (M62.81)  Time: 1055-1130 OT Time Calculation (min): 35 min Charges:  OT General Charges $OT Visit: 1 Visit OT Evaluation $OT Eval Moderate Complexity: 1 Mod OT Treatments $Self Care/Home Management : 8-22 mins  Jefferey Pica, OTR/L Acute Rehabilitation  Services Pager: [phone] Office: [phone]  Shavana Calder C 07/21/2020, 12:34 PM

## 2020-07-21 NOTE — Plan of Care (Signed)
°  Problem: Clinical Measurements: Goal: Cardiovascular complication will be avoided Outcome: Progressing   Problem: Activity: Goal: Risk for activity intolerance will decrease Outcome: Progressing   Problem: Nutrition: Goal: Adequate nutrition will be maintained Outcome: Progressing   

## 2020-07-21 NOTE — Progress Notes (Signed)
PROGRESS NOTE                                                                                                                                                                                                             Patient Demographics:    Seth [name], is a 64 y.o. male, DOB - April 22, 1956, HCW:[id number]  Outpatient Primary MD for the patient is Lucianne Lei, MD    LOS - 2  Admit date - 07/19/2020    Chief Complaint  Patient presents with  . Headache       Brief Narrative (HPI from H&P)  Seth [name] is a 64 y.o. male with medical history significant of depression, BPH, gout, hypertension, stroke, alcohol dependence (drinks 1/2 pint of vodka daily and has been drinking for several years), tobacco use, marijuana use, COPD presenting with presyncope, in the ER found to have dehydration, hypotension, tachycardia, AKI, lactic acidosis and was admitted to the hospital.   Subjective:    Patient in bed, appears comfortable, denies any headache, no fever, no chest pain or pressure, no shortness of breath , no abdominal pain. No focal weakness.  But still feels overall quite weak and balance is still off, getting lightheaded upon standing up.   Assessment  & Plan :      1.  Severe dehydration, AKI, starvation ketosis, lactic acidosis caused by dehydration and AKI, all causing metabolic acidosis, orthostatic hypotension and presyncope.  He will be hydrated, metabolic acidosis and beta hydroxybutyrate much improved after IV fluids.  Continue hydration.  AKI much improved but stable renal ultrasound, continue gentle hydration, TED stockings applied continue monitoring orthostatics.  Echo nonacute, stable on telemetry.  Monitor activity with PT OT.    2.  Severe alcohol abuse, marijuana abuse, smoking.  Very high risk for going into DTs, counseled to quit, Librium scheduled along with CIWA protocol, no signs of DTs at  present.   3.  History of hypertension.  Currently blood pressure is soft due to dehydration.  Monitor.  4.  COPD.  At baseline no acute issues.  5.  History of CVA.  Will place on aspirin.  6. GERD - PPI.  7.  Hypokalemia.  Replaced.  8.  BPH.  Monitor bladder scan and place on Flomax.  9.  Chronic macrocytic anemia worse after IV fluid causing dilution -  check anemia panel, type screen, if needed he is agreeable to packed RBC transfusion, monitor B12 closely.  Threshold to transfuse hemoglobin below 7.5.   Condition -  Guarded  Family Communication  : None, patient will update his extended family, he lives alone.  Code Status :  Full  Consults  :  None  Procedures  :    TTE - 1. Left ventricular ejection fraction, by estimation, is 60 to 65%. The left ventricle has normal function. The left ventricle has no regional wall motion abnormalities. Left ventricular diastolic parameters were normal.  2. Right ventricular systolic function is normal. The right ventricular size is normal.  3. The mitral valve is normal in structure. No evidence of mitral valve regurgitation. No evidence of mitral stenosis.  4. The aortic valve is normal in structure. Aortic valve regurgitation is not visualized. No aortic stenosis is present.  5. The inferior vena cava is normal in size with greater than 50% respiratory variability, suggesting right atrial pressure of 3 mmHg.  CT head.  Nonacute.  Renal ultrasound - non acute  PUD Prophylaxis : PPI  Disposition Plan  :    Status is: Inpatient  Remains inpatient appropriate because:IV treatments appropriate due to intensity of illness or inability to take PO   Dispo: The patient is from: Home              Anticipated d/c is to: Home              Anticipated d/c date is: 3 days              Patient currently is not medically stable to d/c.  DVT Prophylaxis  :    Heparin   Lab Results  Component Value Date   PLT 310 07/21/2020    Diet :   Diet Order            Diet Carb Modified Fluid consistency: Thin; Room service appropriate? No  Diet effective now                  Inpatient Medications  Scheduled Meds: . aspirin  81 mg Oral Daily  . carvedilol  3.125 mg Oral BID  . chlordiazePOXIDE  10 mg Oral TID  . feeding supplement  237 mL Oral BID BM  . folic acid  1 mg Oral Daily  . heparin  5,000 Units Subcutaneous Q8H  . hydrocerin   Topical BID  . multivitamin with minerals  1 tablet Oral Daily  . nicotine  14 mg Transdermal Daily  . pantoprazole  40 mg Oral Daily  . sodium bicarbonate  650 mg Oral TID  . tamsulosin  0.4 mg Oral Daily  . thiamine  100 mg Intravenous Daily  . vitamin B-12  1,000 mcg Oral Daily   Continuous Infusions:  PRN Meds:.LORazepam **OR** LORazepam  Antibiotics  :    Anti-infectives (From admission, onward)   None       Time Spent in minutes  30   [name] M.D on 07/21/2020 at 8:23 AM  To page go to [email or web address] - password [email or web address]  [facility] -  Office  [phone]  See all Orders from today for further details    Objective:   Vitals:   07/20/20 2355 07/21/20 0234 07/21/20 0402 07/21/20 0811  BP: 106/66 104/65 123/67 122/75  Pulse: 97 96 92 92  Resp: 17  16 18   Temp: 99.2 F (37.3 C)  98.8 F (37.1 C) 97.9 F (36.6  C)  TempSrc: Oral  Oral Oral  SpO2: 100%  100% 100%  Weight:   69.5 kg   Height:        Wt Readings from Last 3 Encounters:  07/21/20 69.5 kg  06/04/19 64.1 kg  02/24/19 68.1 kg     Intake/Output Summary (Last 24 hours) at 07/21/2020 0823 Last data filed at 07/21/2020 0321 Gross per 24 hour  Intake 737 ml  Output 930 ml  Net -193 ml     Physical Exam  Awake Alert, No new F.N deficits, Normal affect [place].AT,PERRAL Supple Neck,No JVD, No cervical lymphadenopathy appriciated.  Symmetrical Chest wall movement, Good air movement bilaterally, CTAB RRR,No Gallops, Rubs or new Murmurs, No Parasternal Heave +ve B.Sounds, Abd  Soft, No tenderness, No organomegaly appriciated, No rebound - guarding or rigidity. No Cyanosis, Clubbing or edema, No new Rash or bruise     Data Review:    CBC Recent Labs  Lab 07/19/20 1329 07/19/20 2250 07/20/20 0240 07/21/20 0206  WBC 7.9  --   --  5.8  HGB 9.9* 11.2* 9.9* 8.0*  HCT 31.2* 33.0* 29.0* 23.8*  PLT 296  --   --  310  MCV 117.3*  --   --  107.7*  MCH 37.2*  --   --  36.2*  MCHC 31.7  --   --  33.6  RDW 17.4*  --   --  16.9*  LYMPHSABS  --   --   --  1.1  MONOABS  --   --   --  1.7*  EOSABS  --   --   --  0.0  BASOSABS  --   --   --  0.0    Recent Labs  Lab 07/19/20 1329 07/19/20 2244 07/19/20 2250 07/20/20 0032 07/20/20 0212 07/20/20 0240 07/20/20 0741 07/20/20 0758 07/20/20 1543 07/21/20 0206  NA 134*  --  132*  --   --  129*  --  134*  --  138  K 3.1*  --  3.6  --   --  3.0*  --  3.3*  --  3.4*  CL 95*  --   --   --   --   --   --  90*  --  98  CO2 11*  --   --   --   --   --   --  13*  --  29  GLUCOSE 54*  --   --   --   --   --   --  80  --  118*  BUN 19  --   --   --   --   --   --  23  --  13  CREATININE 1.83*  --   --   --   --   --   --  2.02*  --  1.31*  CALCIUM 9.3  --   --   --   --   --   --  8.7*  --  8.7*  AST  --   --   --   --   --   --   --   --   --  17  ALT  --   --   --   --   --   --   --   --   --  9  ALKPHOS  --   --   --   --   --   --   --   --   --  68  BILITOT  --   --   --   --   --   --   --   --   --  0.7  ALBUMIN  --   --   --   --   --   --   --   --   --  2.8*  MG  --  2.0  --   --   --   --   --   --   --  2.0  DDIMER  --   --   --   --  0.58*  --   --   --   --   --   LATICACIDVEN  --  2.2*  --  2.1*  --   --   --   --   --  1.0  TSH  --   --   --   --  1.196  --   --   --  1.021  --   HGBA1C  --   --   --   --   --   --  4.2*  --   --   --   BNP  --   --   --   --   --   --   --   --   --  109.2*     ------------------------------------------------------------------------------------------------------------------ No results for input(s): CHOL, HDL, LDLCALC, TRIG, CHOLHDL, LDLDIRECT in the last 72 hours.  Lab Results  Component Value Date   HGBA1C 4.2 (L) 07/20/2020   ------------------------------------------------------------------------------------------------------------------ Recent Labs    07/20/20 1543  TSH 1.021    Cardiac Enzymes No results for input(s): CKMB, TROPONINI, MYOGLOBIN in the last 168 hours.  Invalid input(s): CK ------------------------------------------------------------------------------------------------------------------    Component Value Date/Time   BNP 109.2 (H) 07/21/2020 0206    Micro Results Recent Results (from the past 240 hour(s))  Respiratory Panel by RT PCR (Flu A&B, Covid) - Nasopharyngeal Swab     Status: None   Collection Time: 07/19/20 11:07 PM   Specimen: Nasopharyngeal Swab; Nasopharyngeal(NP) swabs in vial transport medium  Result Value Ref Range Status   SARS Coronavirus 2 by RT PCR NEGATIVE NEGATIVE Final    Comment: (NOTE) SARS-CoV-2 target nucleic acids are NOT DETECTED.  The SARS-CoV-2 RNA is generally detectable in upper respiratoy specimens during the acute phase of infection. The lowest concentration of SARS-CoV-2 viral copies this assay can detect is 131 copies/mL. A negative result does not preclude SARS-Cov-2 infection and should not be used as the sole basis for treatment or other patient management decisions. A negative result may occur with  improper specimen collection/handling, submission of specimen other than nasopharyngeal swab, presence of viral mutation(s) within the areas targeted by this assay, and inadequate number of viral copies (<131 copies/mL). A negative result must be combined with clinical observations, patient history, and epidemiological information. The expected result is  Negative.  Fact Sheet for Patients:  PinkCheek.be  Fact Sheet for Healthcare Providers:  GravelBags.it  This test is no t yet approved or cleared by the [place] FDA and  has been authorized for detection and/or diagnosis of SARS-CoV-2 by FDA under an Emergency Use Authorization (EUA). This EUA will remain  in effect (meaning this test can be used) for the duration of the COVID-19 declaration under Section 564(b)(1) of the Act, 21 U.S.C. section 360bbb-3(b)(1), unless the authorization is terminated or revoked sooner.     Influenza A by PCR NEGATIVE NEGATIVE Final  Influenza B by PCR NEGATIVE NEGATIVE Final    Comment: (NOTE) The Xpert Xpress SARS-CoV-2/FLU/RSV assay is intended as an aid in  the diagnosis of influenza from Nasopharyngeal swab specimens and  should not be used as a sole basis for treatment. Nasal washings and  aspirates are unacceptable for Xpert Xpress SARS-CoV-2/FLU/RSV  testing.  Fact Sheet for Patients: PinkCheek.be  Fact Sheet for Healthcare Providers: GravelBags.it  This test is not yet approved or cleared by the [place] FDA and  has been authorized for detection and/or diagnosis of SARS-CoV-2 by  FDA under an Emergency Use Authorization (EUA). This EUA will remain  in effect (meaning this test can be used) for the duration of the  Covid-19 declaration under Section 564(b)(1) of the Act, 21  U.S.C. section 360bbb-3(b)(1), unless the authorization is  terminated or revoked. Performed at [facility], [place] [address] [place], [place] [address]     Radiology Reports CT Head Wo Contrast  Result Date: 07/19/2020 CLINICAL DATA:  Dizziness EXAM: CT HEAD WITHOUT CONTRAST TECHNIQUE: Contiguous axial images were obtained from the base of the skull through the vertex without intravenous contrast. COMPARISON:  None. FINDINGS:  Brain: There is periventricular hypoattenuation compatible with chronic microvascular disease. No extra-axial collection or intracranial hemorrhage. No midline shift or other mass effect. Generalized volume loss. Old bilateral cerebellar infarcts. Vascular: Internal carotid artery atherosclerosis. Skull: Normal. Negative for fracture or focal lesion. Sinuses/Orbits: No acute finding. Other: None. IMPRESSION: 1. No acute intracranial abnormality. 2. Old bilateral cerebellar infarcts and chronic microvascular ischemia. Electronically Signed   By: Ulyses Jarred M.D.   On: 07/19/2020 22:07   US RENAL  Result Date: 07/20/2020 CLINICAL DATA:  Acute kidney injury. EXAM: RENAL / URINARY TRACT ULTRASOUND COMPLETE COMPARISON:  None. FINDINGS: Right Kidney: Renal measurements: 10.8 x 4.6 x 6.7 cm = volume: 176 mL. Echogenicity within normal limits. No solid mass or hydronephrosis visualized. Two adjacent upper pole cysts measuring 1.6 cm and 1.4 cm. Left Kidney: Renal measurements: 11.6 x 5.8 x 5.2 cm = volume: 185 mL. Echogenicity within normal limits. No mass or hydronephrosis identified although the kidney with suboptimally visualized due to patient body habitus. Bladder: Appears normal for degree of bladder distention. Other: None. IMPRESSION: 1. No hydronephrosis. 2. Two small right renal cysts. Electronically Signed   By: Logan Bores M.D.   On: 07/20/2020 10:20   DG Chest Portable 1 View  Result Date: 07/19/2020 CLINICAL DATA:  Shortness of breath, headache on off for 2 weeks. EXAM: PORTABLE CHEST 1 VIEW COMPARISON:  Chest x-ray 05/31/2019, CT chest 02/19/2019 FINDINGS: The heart size and mediastinal contours are within normal limits. Aortic arch calcifications. No focal consolidation. No pulmonary edema. Similar-appearing blunting of the right costophrenic angle. No pleural effusion. No pneumothorax. No acute osseous abnormality. IMPRESSION: No acute cardiopulmonary disease. Electronically Signed   By:  Iven Finn M.D.   On: 07/19/2020 21:52   ECHOCARDIOGRAM COMPLETE  Result Date: 07/20/2020    ECHOCARDIOGRAM REPORT   Patient Name:   Seth [name] Date of Exam: 07/20/2020 Medical Rec #:  [id number]       Height:       74.0 in Accession #:    [id number]      Weight:       141.4 lb Date of Birth:  1955/09/19        BSA:          1.876 m Patient Age:    59 years  BP:           115/84 mmHg Patient Gender: M               HR:           95 bpm. Exam Location:  Inpatient Procedure: 2D Echo, Color Doppler and Cardiac Doppler Indications:    R06.9 DOE  History:        Patient has prior history of Echocardiogram examinations, most                 recent 02/23/2019. COPD; Risk Factors:ETOH Abuse and                 Hypertension.  Sonographer:    Raquel Sarna Senior RDCS Referring Phys: [id number] Shela Leff  Sonographer Comments: Very technically difficult study due to small rib spacing and lung interference. IMPRESSIONS  1. Left ventricular ejection fraction, by estimation, is 60 to 65%. The left ventricle has normal function. The left ventricle has no regional wall motion abnormalities. Left ventricular diastolic parameters were normal.  2. Right ventricular systolic function is normal. The right ventricular size is normal.  3. The mitral valve is normal in structure. No evidence of mitral valve regurgitation. No evidence of mitral stenosis.  4. The aortic valve is normal in structure. Aortic valve regurgitation is not visualized. No aortic stenosis is present.  5. The inferior vena cava is normal in size with greater than 50% respiratory variability, suggesting right atrial pressure of 3 mmHg. FINDINGS  Left Ventricle: Left ventricular ejection fraction, by estimation, is 60 to 65%. The left ventricle has normal function. The left ventricle has no regional wall motion abnormalities. The left ventricular internal cavity size was normal in size. There is  no left ventricular hypertrophy. Left ventricular  diastolic parameters were normal. Normal left ventricular filling pressure. Right Ventricle: The right ventricular size is normal. No increase in right ventricular wall thickness. Right ventricular systolic function is normal. Left Atrium: Left atrial size was normal in size. Right Atrium: Right atrial size was normal in size. Pericardium: There is no evidence of pericardial effusion. Mitral Valve: The mitral valve is normal in structure. No evidence of mitral valve regurgitation. No evidence of mitral valve stenosis. Tricuspid Valve: The tricuspid valve is normal in structure. Tricuspid valve regurgitation is not demonstrated. No evidence of tricuspid stenosis. Aortic Valve: The aortic valve is normal in structure. Aortic valve regurgitation is not visualized. No aortic stenosis is present. Pulmonic Valve: The pulmonic valve was normal in structure. Pulmonic valve regurgitation is not visualized. No evidence of pulmonic stenosis. Aorta: The aortic root is normal in size and structure. Venous: The inferior vena cava is normal in size with greater than 50% respiratory variability, suggesting right atrial pressure of 3 mmHg. IAS/Shunts: No atrial level shunt detected by color flow Doppler.  LEFT VENTRICLE PLAX 2D LVIDd:         3.10 cm  Diastology LVIDs:         1.90 cm  LV e' medial:    7.94 cm/s LV PW:         1.10 cm  LV E/e' medial:  7.9 LV IVS:        1.10 cm  LV e' lateral:   11.00 cm/s LVOT diam:     2.00 cm  LV E/e' lateral: 5.7 LV SV:         58 LV SV Index:   31 LVOT Area:     3.14 cm  RIGHT  VENTRICLE RV S prime:     12.00 cm/s TAPSE (M-mode): 2.1 cm LEFT ATRIUM             Index       RIGHT ATRIUM           Index LA diam:        2.90 cm 1.55 cm/m  RA Area:     10.60 cm LA Vol (A2C):   37.9 ml 20.21 ml/m RA Volume:   22.20 ml  11.84 ml/m LA Vol (A4C):   26.7 ml 14.23 ml/m LA Biplane Vol: 34.1 ml 18.18 ml/m  AORTIC VALVE LVOT Vmax:   91.40 cm/s LVOT Vmean:  78.200 cm/s LVOT VTI:    0.186 m  AORTA Ao  Root diam: 3.70 cm MITRAL VALVE MV Area (PHT): 3.24 cm    SHUNTS MV Decel Time: 234 msec    Systemic VTI:  0.19 m MV E velocity: 62.60 cm/s  Systemic Diam: 2.00 cm MV A velocity: 66.80 cm/s MV E/A ratio:  0.94 Mihai Croitoru MD Electronically signed by Sanda Klein MD Signature Date/Time: 07/20/2020/2:17:47 PM    Final

## 2020-07-21 NOTE — TOC Initial Note (Signed)
Transition of Care [facility]) - Initial/Assessment Note    Patient Details  Name: Seth [name] MRN: [id number] Date of Birth: October 01, 1955  Transition of Care [facility]) CM/SW Contact:    Trula Ore, [place] Phone Number: 07/21/2020, 4:42 PM  Clinical Narrative:                CSW received consult for possible SNF placement at time of discharge. CSW spoke with patient at bedside regarding PT recommendation of SNF placement at time of discharge. Patient expressed understanding of PT recommendation and is agreeable to SNF placement at time of discharge.Patient gave CSW permission to fax out initial referral near [place] area.Patient gave CSW permission to discuss his care with his sister Tito Dine. Patient has received the COVID vaccines. Patient expressed being hopeful for rehab and to feel better soon. No further questions reported at this time. CSW to continue to follow and assist with discharge planning needs.     Expected Discharge Plan: Skilled Nursing Facility Barriers to Discharge: Continued Medical Work up   Patient Goals and CMS Choice Patient states their goals for this hospitalization and ongoing recovery are:: to go to SNF CMS Medicare.gov Compare Post Acute Care list provided to:: Patient Choice offered to / list presented to : Patient  Expected Discharge Plan and Services Expected Discharge Plan: [place]       Living arrangements for the past 2 months: Single Family Home                                      Prior Living Arrangements/Services Living arrangements for the past 2 months: Single Family Home Lives with:: Self Patient language and need for interpreter reviewed:: Yes Do you feel safe going back to the place where you live?: No   SNF  Need for Family Participation in Patient Care: Yes (Comment) Care giver support system in place?: Yes (comment)   Criminal Activity/Legal Involvement Pertinent to Current Situation/Hospitalization: No -  Comment as needed  Activities of Daily Living Home Assistive Devices/Equipment: Eyeglasses, Dentures (specify type), Cane (specify quad or straight), Ehrsam (specify type) ADL Screening (condition at time of admission) Patient's cognitive ability adequate to safely complete daily activities?: Yes Is the patient deaf or have difficulty hearing?: No Does the patient have difficulty seeing, even when wearing glasses/contacts?: No Does the patient have difficulty concentrating, remembering, or making decisions?: No Patient able to express need for assistance with ADLs?: Yes Does the patient have difficulty dressing or bathing?: No Independently performs ADLs?: Yes (appropriate for developmental age) Communication: Independent Dressing (OT): Independent Grooming: Independent Feeding: Independent Bathing: Independent Toileting: Independent In/Out Bed: Independent Walks in Home: Independent with device (comment) Does the patient have difficulty walking or climbing stairs?: Yes Weakness of Legs: Both Weakness of Arms/Hands: None  Permission Sought/Granted Permission sought to share information with : Case Manager, Family Supports, Chartered certified accountant granted to share information with : Yes, Verbal Permission Granted  Share Information with NAME: Tito Dine  Permission granted to share info w AGENCY: SNF  Permission granted to share info w Relationship: sister  Permission granted to share info w Contact Information: Tito Dine [phone]  Emotional Assessment Appearance:: Appears stated age Attitude/Demeanor/Rapport: Gracious Affect (typically observed): Calm Orientation: : Oriented to Self Alcohol / Substance Use: Not Applicable Psych Involvement: No (comment)  Admission diagnosis:  Alcohol abuse [F75.88] Metabolic acidosis [T25.4] Weakness [R53.1] AKI (acute kidney injury) ([place]) [  N17.9] Frequent falls [R29.6] Patient Active Problem List   Diagnosis Date Noted    AKI (acute kidney injury) ([place]) 07/20/2020   Hypoglycemia 70/35/0093   Metabolic acidosis 81/82/9937   Sinus tachycardia 05/31/2019   DOE (dyspnea on exertion) 05/31/2019   Generalized weakness 02/19/2019   Hypertensive urgency 02/19/2019   Hyperkalemia 02/19/2019   Left fibular fracture 02/19/2019   Chronic alcoholism ([place]) 02/19/2019   Malnutrition of moderate degree 08/25/2017   Acute respiratory failure with hypoxia ([place]) 08/23/2017   Chest pain 08/23/2017   Elevated blood pressure reading 08/23/2017   Tobacco abuse 08/23/2017   COPD exacerbation ([place]) 08/23/2017   PCP:  Lucianne Lei, MD Pharmacy:   [place] ([place]), [place] - [address] [place] ([place])  [address] Phone: [phone] Fax: [place], [place] - [address] AT [address] [place] [address] Phone: [phone] Fax: [phone]  [id number] - [place], [place] - [address] AT [address] [place] [address] Phone: [phone] Fax: [place], [place] - [place] [address] Phone: [phone] Fax: [phone]     Social Determinants of Health ([place]) Interventions    Readmission Risk Interventions Readmission Risk Prevention Plan 06/04/2019  Transportation Screening Complete  PCP or Specialist Appt within 5-7 Days Complete  Home Care Screening Complete  Medication Review (RN CM) Complete  Some recent data might be hidden

## 2020-07-21 NOTE — Progress Notes (Addendum)
Physical Therapy Treatment Patient Details Name: LOGIN MUCKLEROY MRN: [id number] DOB: 19-Aug-1956 Today's Date: 07/21/2020    History of Present Illness 64 yo male with onset of dizziness and SOB with HA's was admitted.  Found AKI, orthostatic BP's, low BS and low K+.  PMHx:  c-spine fusion, gout, HTN, EtOH abuse, COPD, B cerebellar strokes, atherosclerosisl    PT Comments    PTA focused on bed level treatment as he was recently orthostatic with OT earlier today.  Focused on positioning in bed, moving to [facility] and LE strengthening.  Pt also presents with tight shoulders and neck and required mild stretching in supine.  Pt post session sitting in chair position eating lunch.  BP supine 100/65, BP sitting in chair position 115/74.  Pt tolerated bed level session today.  Hopeful to progress to OOB mobility next session.    Of note his TED stocking are the incorrect size and he would benefit from a properly fitting pair to manage his orthostasis.      Follow Up Recommendations  SNF     Equipment Recommendations  None recommended by PT    Recommendations for Other Services       Precautions / Restrictions Precautions Precautions: Fall Precaution Comments: dehydration, low intake Restrictions Weight Bearing Restrictions: No    Mobility  Bed Mobility Overal bed mobility: Needs Assistance Bed Mobility:  (boosting to [facility])        General bed mobility comments: trended bed to boost to [facility].  pt very tight in shoulders and neck but able to work to reach head board and boost himself into supine.  POst tx positioned in chair position to eat lunch and BP is stable with no signs or symptoms of dizziness.  Transfers  Ambulation/Gait                 Stairs             Wheelchair Mobility    Modified Rankin (Stroke Patients Only)       Balance Overall balance assessment: Needs assistance Sitting-balance support: Feet supported Sitting balance-[name] Scale: Fair      Standing balance support: Bilateral upper extremity supported;During functional activity Standing balance-[name] Scale: Poor                              Cognition Arousal/Alertness: Awake/alert;Lethargic (more rousable as tx progressed.) Behavior During Therapy: WFL for tasks assessed/performed Overall Cognitive Status: Impaired/Different from baseline Area of Impairment: Following commands;Problem solving;Safety/judgement                       Following Commands: Follows one step commands inconsistently;Follows one step commands with increased time Safety/Judgement: Decreased awareness of safety;Decreased awareness of deficits   Problem Solving: Slow processing General Comments: Pt required increased time to follow commands and required cues to be repeated.      Exercises General Exercises - Lower Extremity Ankle Circles/Pumps: AROM;Both;10 reps;Supine Heel Slides: AROM;AAROM;Both;10 reps;Supine Hip ABduction/ADduction: AAROM;AROM;Both;10 reps;Supine Straight Leg Raises: AROM;AAROM;Both;10 reps;Supine Low Level/ICU Exercises Stabilized Bridging: AROM;Both;10 reps;Supine    General Comments      Pertinent Vitals/Pain Pain Assessment: Faces Faces Pain Scale: Hurts a little bit Pain Location: generalized stiffness Pain Descriptors / Indicators: Discomfort;Tightness Pain Intervention(s): Monitored during session;Repositioned    Home Living Family/patient expects to be discharged to:: Private residence Living Arrangements: Alone Available Help at Discharge: Family;Available 24 hours/day Type of Home: Apartment Home Access: Level  entry   Home Layout: One level Home Equipment: Getter - 2 wheels      Prior Function Level of Independence: Independent      Comments: Per H&P, pt unable to take proper care of self with medications/diet.   PT Goals (current goals can now be found in the care plan section) Acute Rehab PT Goals Patient Stated Goal: to  feel better Potential to Achieve Goals: Good Progress towards PT goals: Not progressing toward goals - comment (limited still due to orthostasis earlier today.)    Frequency    Min 3X/week      PT Plan Current plan remains appropriate    Co-evaluation              AM-PAC PT "6 Clicks" Mobility   Outcome Measure  Help needed turning from your back to your side while in a flat bed without using bedrails?: A Little Help needed moving from lying on your back to sitting on the side of a flat bed without using bedrails?: A Little Help needed moving to and from a bed to a chair (including a wheelchair)?: A Little Help needed standing up from a chair using your arms (e.g., wheelchair or bedside chair)?: A Little Help needed to walk in hospital room?: A Little Help needed climbing 3-5 steps with a railing? : Total 6 Click Score: 16    End of Session Equipment Utilized During Treatment: Gait belt Activity Tolerance: Patient limited by fatigue;Treatment limited secondary to medical complications (Comment) Patient left: in bed;with call bell/phone within reach;with bed alarm set;with nursing/sitter in room Nurse Communication: Mobility status PT Visit Diagnosis: Unsteadiness on feet (R26.81);Muscle weakness (generalized) (M62.81);Difficulty in walking, not elsewhere classified (R26.2)     Time: 4975-3005 PT Time Calculation (min) (ACUTE ONLY): 23 min  Charges:  $Therapeutic Exercise: 8-22 mins $Therapeutic Activity: 8-22 mins                     Erasmo Leventhal , PTA Acute Rehabilitation Services Pager [phone] Office [phone]     Dorthia Tout [name] 07/21/2020, 1:58 PM

## 2020-07-21 NOTE — Discharge Instructions (Signed)
Follow with Primary MD Lucianne Lei, MD in 7 days   Get CBC, CMP, anemia panel -  checked next visit within 1 week by Primary MD    Activity: As tolerated with Full fall precautions use [name]/cane & assistance as needed  Disposition Home   Diet: Heart Healthy    Special Instructions: If you have smoked or chewed Tobacco  in the last 2 yrs please stop smoking, stop any regular Alcohol  and or any Recreational drug use.  On your next visit with your primary care physician please Get Medicines reviewed and adjusted.  Please request your Prim.MD to go over all Hospital Tests and Procedure/Radiological results at the follow up, please get all Hospital records sent to your Prim MD by signing hospital release before you go home.  If you experience worsening of your admission symptoms, develop shortness of breath, life threatening emergency, suicidal or homicidal thoughts you must seek medical attention immediately by calling 911 or calling your MD immediately  if symptoms less severe.  You Must read complete instructions/literature along with all the possible adverse reactions/side effects for all the Medicines you take and that have been prescribed to you. Take any new Medicines after you have completely understood and accpet all the possible adverse reactions/side effects.   Do not drive, operate heavy machinery, perform activities at heights, swimming or participation in water activities or provide baby sitting services if your were admitted for syncope or siezures until you have seen by Primary MD or a Neurologist and advised to do so again.  Do not drive when taking Pain medications.  Do not take more than prescribed Pain, Sleep and Anxiety Medications  Wear Seat belts while driving.

## 2020-07-21 NOTE — NC FL2 (Signed)
[place] LEVEL OF CARE SCREENING TOOL     IDENTIFICATION  Patient Name: Seth [name] Birthdate: April 02, 1956 Sex: male Admission Date (Current Location): 07/19/2020  Outpatient Carecenter and [place] Number:  Herbalist and Address:  [facility] [place], [place] [address], [place], [place] [address]      Provider Number: [id number]  Attending Physician Name and Address:  Thurnell Lose, MD  Relative Name and Phone Number:  Tito Dine [phone]    Current Level of Care: Hospital Recommended Level of Care: [place] Prior Approval Number:    Date Approved/Denied:   PASRR Number: [id number]  Discharge Plan: SNF    Current Diagnoses: Patient Active Problem List   Diagnosis Date Noted  . AKI (acute kidney injury) ([place]) 07/20/2020  . Hypoglycemia 07/20/2020  . Metabolic acidosis 66/29/4765  . Sinus tachycardia 05/31/2019  . DOE (dyspnea on exertion) 05/31/2019  . Generalized weakness 02/19/2019  . Hypertensive urgency 02/19/2019  . Hyperkalemia 02/19/2019  . Left fibular fracture 02/19/2019  . Chronic alcoholism ([place]) 02/19/2019  . Malnutrition of moderate degree 08/25/2017  . Acute respiratory failure with hypoxia ([place]) 08/23/2017  . Chest pain 08/23/2017  . Elevated blood pressure reading 08/23/2017  . Tobacco abuse 08/23/2017  . COPD exacerbation ([place]) 08/23/2017    Orientation RESPIRATION BLADDER Height & Weight     Self, Time, Situation, Place  Normal Continent Weight: 153 lb 3.5 oz (69.5 kg) Height:  6\' 2"  (188 cm)  BEHAVIORAL SYMPTOMS/MOOD NEUROLOGICAL BOWEL NUTRITION STATUS      Continent Diet (See Discharge Summary)  AMBULATORY STATUS COMMUNICATION OF NEEDS Skin   Limited Assist Verbally Other (Comment) (Dry,Flaky,intact,cracking foot right;left)                       Personal Care Assistance Level of Assistance  Bathing, Feeding, Dressing Bathing Assistance: Limited assistance Feeding assistance:  Independent (able to feed self,carb modified) Dressing Assistance: Limited assistance     Functional Limitations Info  Sight, Hearing, Speech Sight Info: Impaired Hearing Info: Adequate Speech Info: Adequate    SPECIAL CARE FACTORS FREQUENCY  PT (By licensed PT), OT (By licensed OT)     PT Frequency: 5x min weekly OT Frequency: 5x min weekly            Contractures Contractures Info: Not present    Additional Factors Info  Code Status Code Status Info: FULL             Current Medications (07/21/2020):  This is the current hospital active medication list Current Facility-Administered Medications  Medication Dose Route Frequency Provider Last Rate Last Admin  . aspirin chewable tablet 81 mg  81 mg Oral Daily Thurnell Lose, MD   81 mg at 07/21/20 0834  . [START ON 07/22/2020] carvedilol (COREG) tablet 3.125 mg  3.125 mg Oral BID Thurnell Lose, MD      . chlordiazePOXIDE (LIBRIUM) capsule 10 mg  10 mg Oral TID Thurnell Lose, MD   10 mg at 07/21/20 1623  . cyanocobalamin ((VITAMIN B-12)) injection 1,000 mcg  1,000 mcg Intramuscular Daily [name] K, MD      . feeding supplement (ENSURE ENLIVE / ENSURE PLUS) liquid 237 mL  237 mL Oral TID BM Thurnell Lose, MD   237 mL at 07/21/20 1623  . ferrous sulfate tablet 325 mg  325 mg Oral BID WC Thurnell Lose, MD   325 mg at 07/21/20 1623  .  folic acid (FOLVITE) tablet 1 mg  1 mg Oral Daily Shela Leff, MD   1 mg at 07/21/20 0835  . heparin injection 5,000 Units  5,000 Units Subcutaneous Q8H Shela Leff, MD   5,000 Units at 07/21/20 1622  . hydrocerin (EUCERIN) cream   Topical BID Thurnell Lose, MD   Given at 07/21/20 [phone]  . lactated ringers infusion   Intravenous Continuous Thurnell Lose, MD 75 mL/hr at 07/21/20 0845 New Bag at 07/21/20 0845  . LORazepam (ATIVAN) tablet 1-4 mg  1-4 mg Oral Q1H PRN Shela Leff, MD       Or  . LORazepam (ATIVAN) injection 1-4 mg  1-4 mg  Intravenous Q1H PRN Shela Leff, MD   1 mg at 07/21/20 1108  . multivitamin with minerals tablet 1 tablet  1 tablet Oral Daily Shela Leff, MD   1 tablet at 07/21/20 0835  . nicotine (NICODERM CQ - dosed in mg/24 hours) patch 14 mg  14 mg Transdermal Daily Shela Leff, MD      . pantoprazole (PROTONIX) EC tablet 40 mg  40 mg Oral Daily Thurnell Lose, MD   40 mg at 07/21/20 0834  . sodium bicarbonate tablet 650 mg  650 mg Oral TID Thurnell Lose, MD   650 mg at 07/21/20 1623  . tamsulosin (FLOMAX) capsule 0.4 mg  0.4 mg Oral Daily Thurnell Lose, MD   0.4 mg at 07/21/20 0835  . thiamine (B-1) injection 100 mg  100 mg Intravenous Daily Shela Leff, MD   100 mg at 07/21/20 8832     Discharge Medications: Please see discharge summary for a list of discharge medications.  Relevant Imaging Results:  Relevant Lab Results:   Additional Information [phone]  Trula Ore, LCSWA

## 2020-07-21 NOTE — Progress Notes (Addendum)
Initial Nutrition Assessment  DOCUMENTATION CODES:   Not applicable  INTERVENTION:    Ensure Enlive po increase to TID, each supplement provides 350 kcal and 20 grams of protein  Continue MVI with minerals daily  NUTRITION DIAGNOSIS:   Inadequate oral intake related to decreased appetite as evidenced by per patient/family report.  GOAL:   Patient will meet greater than or equal to 90% of their needs  MONITOR:   PO intake, Supplement acceptance  REASON FOR ASSESSMENT:   Malnutrition Screening Tool    ASSESSMENT:   64 yo male admitted with frequent falls, 40 lb weight loss in 4-5 months. PMH includes depression, BPH, gout, HTN, stroke, alcohol dependence, tobacco use, marijuana use, COPD.   On admission, patient reported recent weight loss > 34 lbs, along with decreased appetite and poor intake on the malnutrition screening tool. Usual weights reviewed. Most recent weight PTA is from 06/04/2019, 64.1 kg Currently 69.5 kg Patient has had 35 lb weight loss over the past 4 years, but no significant weight loss recently, unless he gained and lost ~40 lbs within the past year.   Wt Readings from Last 10 Encounters:  07/21/20 69.5 kg  06/04/19 64.1 kg  02/24/19 68.1 kg  05/08/18 69.9 kg  01/07/18 72.6 kg  12/25/17 73.5 kg  12/07/17 73 kg  08/25/17 71.7 kg  07/13/16 85.3 kg  06/03/16 85.7 kg    Patient is currently on a carbohydrate modified diet, meal intakes not recorded. Ensure Enlive has been ordered BID and patient accepted one this morning.  Labs reviewed. K 3.4 CBG: [phone]  Medications reviewed and include folic acid, MVI with minerals, protonix, flomax, thiamine, vitamin B-12. IVF: LR at 75 ml/h   Diet Order:   Diet Order            Diet Carb Modified Fluid consistency: Thin; Room service appropriate? No  Diet effective now                 EDUCATION NEEDS:   Not appropriate for education at this time  Skin:  Skin Assessment: Reviewed RN  Assessment  Last BM:  11/21  Height:   Ht Readings from Last 1 Encounters:  07/20/20 6\' 2"  (1.88 m)    Weight:   Wt Readings from Last 1 Encounters:  07/21/20 69.5 kg    Ideal Body Weight:  86.4 kg  BMI:  Body mass index is 19.67 kg/m.  Estimated Nutritional Needs:   Kcal:  2100-2300  Protein:  100-120 gm  Fluid:  >/= 2.1 L    Lucas Mallow, RD, LDN, CNSC Please refer to Amion for contact information.

## 2020-07-22 ENCOUNTER — Inpatient Hospital Stay (HOSPITAL_COMMUNITY): Payer: Medicare HMO

## 2020-07-22 DIAGNOSIS — N179 Acute kidney failure, unspecified: Secondary | ICD-10-CM | POA: Diagnosis not present

## 2020-07-22 DIAGNOSIS — R296 Repeated falls: Secondary | ICD-10-CM

## 2020-07-22 DIAGNOSIS — E872 Acidosis: Secondary | ICD-10-CM | POA: Diagnosis not present

## 2020-07-22 DIAGNOSIS — F1020 Alcohol dependence, uncomplicated: Secondary | ICD-10-CM | POA: Diagnosis not present

## 2020-07-22 LAB — CBC WITH DIFFERENTIAL/PLATELET
Abs Immature Granulocytes: 0.17 10*3/uL — ABNORMAL HIGH (ref 0.00–0.07)
Basophils Absolute: 0 10*3/uL (ref 0.0–0.1)
Basophils Relative: 0 %
Eosinophils Absolute: 0.1 10*3/uL (ref 0.0–0.5)
Eosinophils Relative: 1 %
HCT: 23.5 % — ABNORMAL LOW (ref 39.0–52.0)
Hemoglobin: 7.7 g/dL — ABNORMAL LOW (ref 13.0–17.0)
Immature Granulocytes: 2 %
Lymphocytes Relative: 15 %
Lymphs Abs: 1.2 10*3/uL (ref 0.7–4.0)
MCH: 36.8 pg — ABNORMAL HIGH (ref 26.0–34.0)
MCHC: 32.8 g/dL (ref 30.0–36.0)
MCV: 112.4 fL — ABNORMAL HIGH (ref 80.0–100.0)
Monocytes Absolute: 2.2 10*3/uL — ABNORMAL HIGH (ref 0.1–1.0)
Monocytes Relative: 28 %
Neutro Abs: 4.2 10*3/uL (ref 1.7–7.7)
Neutrophils Relative %: 54 %
Platelets: 319 10*3/uL (ref 150–400)
RBC: 2.09 MIL/uL — ABNORMAL LOW (ref 4.22–5.81)
RDW: 17.2 % — ABNORMAL HIGH (ref 11.5–15.5)
WBC: 7.9 10*3/uL (ref 4.0–10.5)
nRBC: 1.8 % — ABNORMAL HIGH (ref 0.0–0.2)

## 2020-07-22 LAB — COMPREHENSIVE METABOLIC PANEL
ALT: 10 U/L (ref 0–44)
AST: 26 U/L (ref 15–41)
Albumin: 2.6 g/dL — ABNORMAL LOW (ref 3.5–5.0)
Alkaline Phosphatase: 58 U/L (ref 38–126)
Anion gap: 10 (ref 5–15)
BUN: 12 mg/dL (ref 8–23)
CO2: 28 mmol/L (ref 22–32)
Calcium: 8.6 mg/dL — ABNORMAL LOW (ref 8.9–10.3)
Chloride: 103 mmol/L (ref 98–111)
Creatinine, Ser: 1.08 mg/dL (ref 0.61–1.24)
GFR, Estimated: >60 mL/min (ref >60)
Glucose, Bld: 103 mg/dL — ABNORMAL HIGH (ref 70–99)
Potassium: 4 mmol/L (ref 3.5–5.1)
Sodium: 141 mmol/L (ref 135–145)
Total Bilirubin: 0.3 mg/dL (ref 0.3–1.2)
Total Protein: 5.2 g/dL — ABNORMAL LOW (ref 6.5–8.1)

## 2020-07-22 LAB — GLUCOSE, CAPILLARY
Glucose-Capillary: 100 mg/dL — ABNORMAL HIGH (ref 70–99)
Glucose-Capillary: 91 mg/dL (ref 70–99)
Glucose-Capillary: 104 mg/dL — ABNORMAL HIGH (ref 70–99)

## 2020-07-22 LAB — BRAIN NATRIURETIC PEPTIDE: B Natriuretic Peptide: 133.9 pg/mL — ABNORMAL HIGH (ref 0.0–100.0)

## 2020-07-22 LAB — MAGNESIUM: Magnesium: 1.6 mg/dL — ABNORMAL LOW (ref 1.7–2.4)

## 2020-07-22 IMAGING — DX DG CHEST 1V PORT SAME DAY
1 series · 1 of 1 positions shown · non-contrast
Comparison: [DATE]

CLINICAL DATA: Shortness of breath

EXAM:
PORTABLE CHEST 1 VIEW

[chest ap]
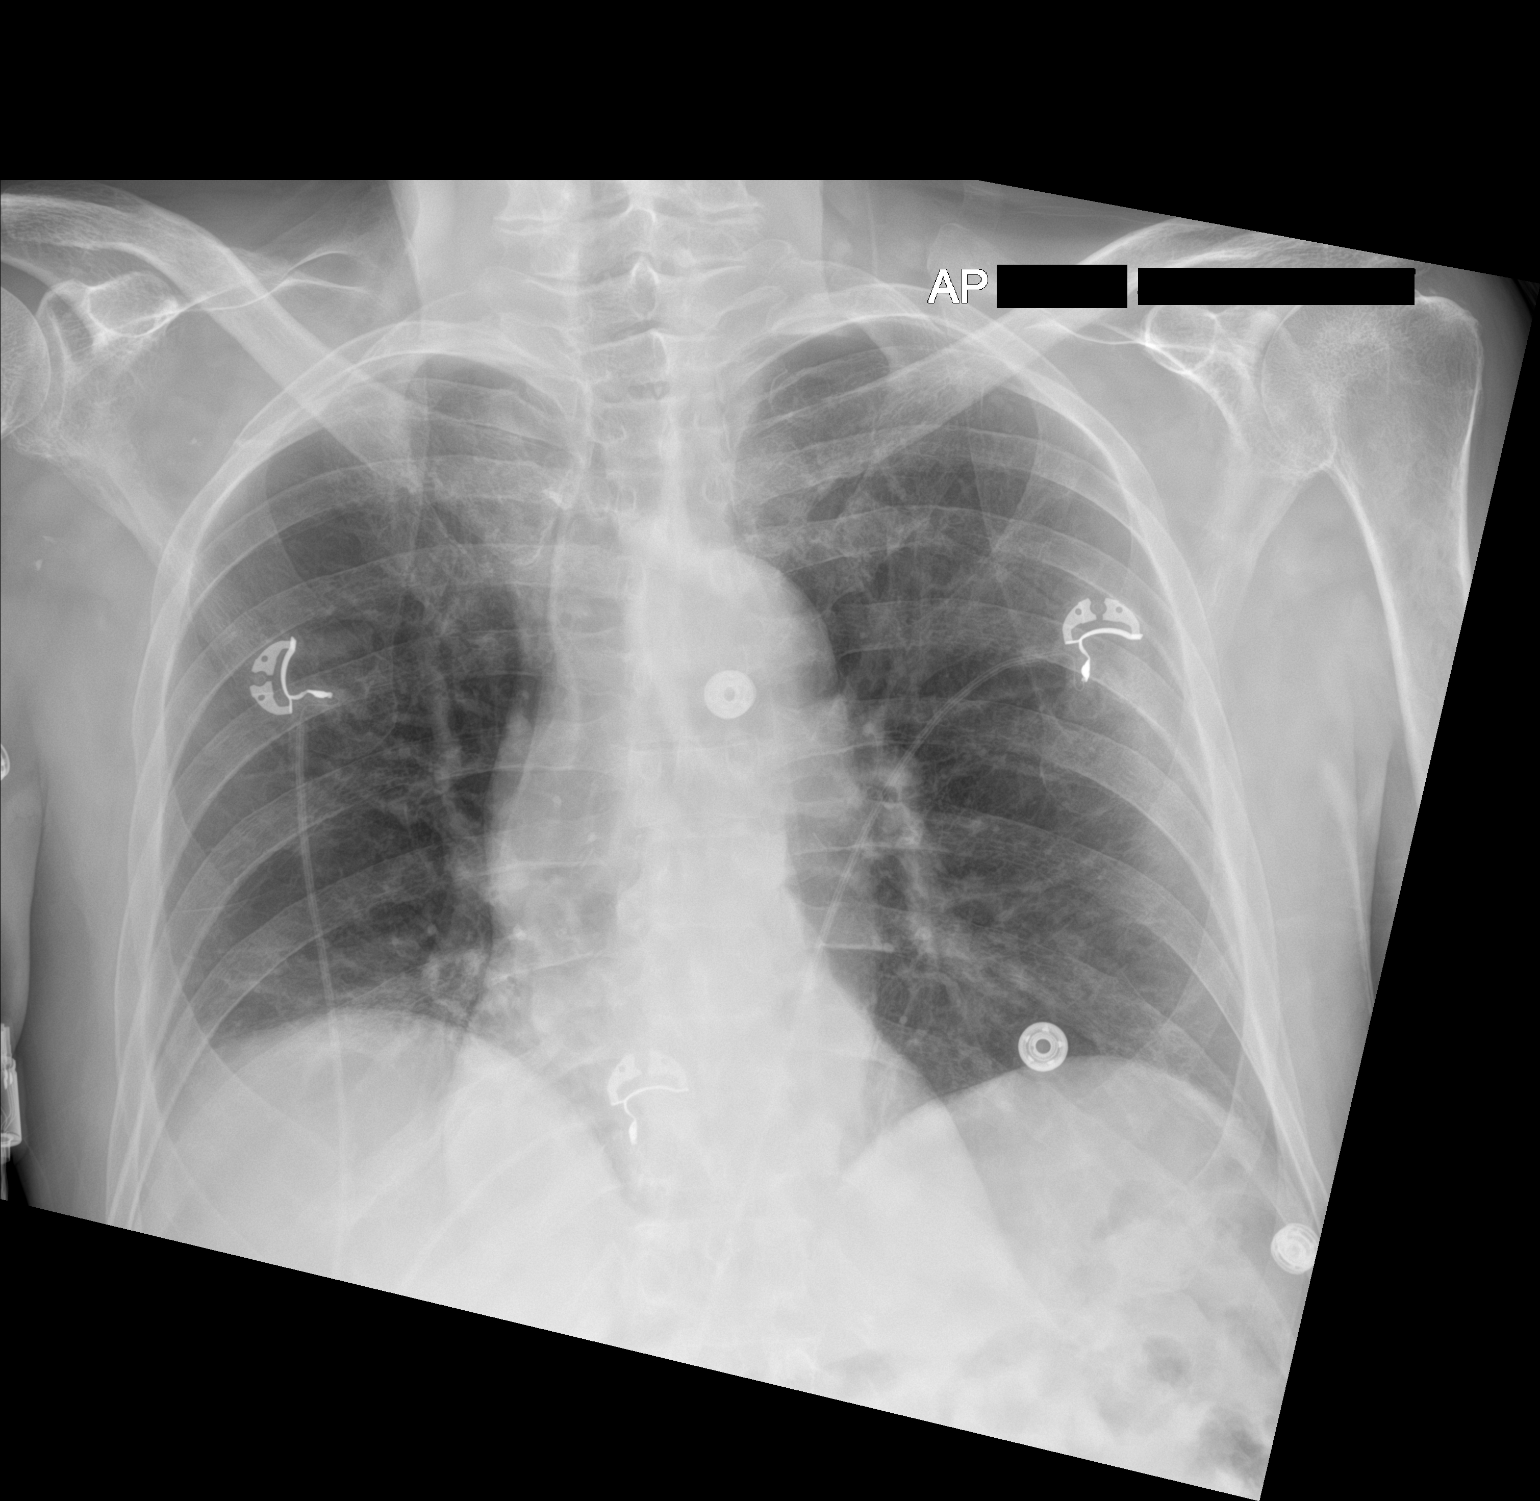

[1 of 1 positions shown; findings below may reference images not displayed]

FINDINGS: The cardiomediastinal silhouette is unchanged in contour when
accounting for differences in technique.Tortuous thoracic aorta.
Elevation the RIGHT hemidiaphragm, unchanged. No pleural effusion.
No pneumothorax. RIGHT basilar heterogeneous opacities, new since
prior. Faint LEFT basilar heterogeneous opacities. Visualized
abdomen is unremarkable. Multilevel degenerative changes of the
thoracic spine.
IMPRESSION: Mild RIGHT greater than LEFT basilar heterogeneous opacities.
Differential considerations include infection, aspiration or
atelectasis.

## 2020-07-22 MED ORDER — ALBUTEROL SULFATE HFA 108 (90 BASE) MCG/ACT IN AERS
2.0000 | INHALATION_SPRAY | RESPIRATORY_TRACT | Status: DC | PRN
  Administered 2020-07-27: 2 via RESPIRATORY_TRACT
  Filled 2020-07-22: qty 6.7

## 2020-07-22 MED ORDER — FLUTICASONE FUROATE-VILANTEROL 200-25 MCG/INH IN AEPB
1.0000 | INHALATION_SPRAY | Freq: Every day | RESPIRATORY_TRACT | Status: DC
  Administered 2020-07-23 – 2020-07-31 (×8): 1 via RESPIRATORY_TRACT
  Filled 2020-07-22: qty 28

## 2020-07-22 MED ORDER — GUAIFENESIN-DM 100-10 MG/5ML PO SYRP
10.0000 mL | ORAL_SOLUTION | Freq: Four times a day (QID) | ORAL | Status: DC | PRN
  Administered 2020-07-22: 10 mL via ORAL
  Filled 2020-07-22: qty 10

## 2020-07-22 MED ORDER — METOPROLOL TARTRATE 12.5 MG HALF TABLET
12.5000 mg | ORAL_TABLET | Freq: Two times a day (BID) | ORAL | Status: DC
  Administered 2020-07-22: 12.5 mg via ORAL
  Filled 2020-07-22: qty 1

## 2020-07-22 MED ORDER — ACETAMINOPHEN 325 MG PO TABS
650.0000 mg | ORAL_TABLET | Freq: Four times a day (QID) | ORAL | Status: DC | PRN
  Administered 2020-07-22 – 2020-07-28 (×2): 650 mg via ORAL
  Filled 2020-07-22 (×2): qty 2

## 2020-07-22 MED ORDER — SODIUM CHLORIDE 0.9 % IV SOLN
3.0000 g | Freq: Four times a day (QID) | INTRAVENOUS | Status: AC
  Administered 2020-07-22 – 2020-07-27 (×21): 3 g via INTRAVENOUS
  Filled 2020-07-22 (×3): qty 3
  Filled 2020-07-22 (×2): qty 8
  Filled 2020-07-22 (×6): qty 3
  Filled 2020-07-22: qty 8
  Filled 2020-07-22 (×6): qty 3
  Filled 2020-07-22: qty 8
  Filled 2020-07-22: qty 3
  Filled 2020-07-22 (×2): qty 8

## 2020-07-22 MED ORDER — UMECLIDINIUM BROMIDE 62.5 MCG/INH IN AEPB
1.0000 | INHALATION_SPRAY | Freq: Every day | RESPIRATORY_TRACT | Status: DC
  Administered 2020-07-23 – 2020-07-30 (×7): 1 via RESPIRATORY_TRACT
  Filled 2020-07-22 (×2): qty 7

## 2020-07-22 MED ORDER — BUDESON-GLYCOPYRROL-FORMOTEROL 160-9-4.8 MCG/ACT IN AERO: 1.0000 | INHALATION_SPRAY | Freq: Two times a day (BID) | RESPIRATORY_TRACT | Status: DC

## 2020-07-22 MED ORDER — SODIUM BICARBONATE 650 MG PO TABS
650.0000 mg | ORAL_TABLET | Freq: Two times a day (BID) | ORAL | Status: DC
  Administered 2020-07-23: 650 mg via ORAL
  Filled 2020-07-22: qty 1

## 2020-07-22 MED ORDER — MAGNESIUM SULFATE 2 GM/50ML IV SOLN
2.0000 g | Freq: Once | INTRAVENOUS | Status: AC
  Administered 2020-07-22: 2 g via INTRAVENOUS
  Filled 2020-07-22: qty 50

## 2020-07-22 NOTE — Progress Notes (Signed)
PROGRESS NOTE        PATIENT DETAILS Name: Seth [name] Age: 64 y.o. Sex: male Date of Birth: [birth year] Admit Date: 07/19/2020 Admitting Physician Shela Leff, MD [id number], Myra Rude, MD  Brief Narrative: Patient is a 64 y.o. male with history of EtOH use, CVA, gout, HTN, BPH, depression-we will presented with presyncope-found to have AKI, lactic acidosis, hypotension and admitted to the hospitalist service.  Significant events: 11/22: Admit for dehydration/AKI/hypotension.  Significant studies: 11/22>> CT head: No acute intracranial abnormality. 11/22>> chest x-ray: No acute cardiopulmonary disease. 11/23>> Echo: EF 60-65% 11/23>> renal ultrasound: No hydronephrosis, 2 small right renal cysts 11/25>> chest x-ray: Right> left basilar opacities-likely pneumonia.  Antimicrobial therapy: Unasyn: 11/25>>  Microbiology data: 11/25 >>blood culture: Ordered  Procedures : None  Consults: None  DVT Prophylaxis : Place TED hose Start: 07/20/20 1522 heparin injection 5,000 Units Start: 07/20/20 0600   Subjective: Low-grade fever overnight-tachycardic.  Coughing  Assessment/Plan: Presyncope/frequent falls: Secondary to orthostatic hypotension in the setting of dehydration: Improved-volume status appears stable-recheck orthostatic vital signs today.  Continue TED hose-evaluated by PT-with recommendations to pursue SNF.  AKI: Hemodynamically mediated in the setting of poor oral intake/dehydration.  Improved with supportive care.  Metabolic acidosis: Likely starvation ketosis-no indication of sepsis.  Improved with supportive care.  Check phosphorus levels in a.m.  Aspiration pneumonia: Wheezing-coughing-low-grade fever overnight-chest x-ray consistent with pneumonia-high suspicion for aspiration given alcohol abuse-start Unasyn-check cultures-follow clinical course.  EtOH use: Awake/alert-mild tremors-continue scheduled Librium-Ativan per CIWA  protocol.  HTN: BP stable-start beta-blocker given orthostatic hypotension.  History of CVA: No focal deficits-continue aspirin  GERD: Continue PPI  BPH: Currently on Flomax-recheck orthostatic vital signs-and adjust accordingly.    Anemia: Appears to have macrocytic anemia at baseline with a hemoglobin of around 9-drop in hemoglobin is likely from IV fluid dilution.  No evidence of blood loss at this point.  Anemia panel reviewed-appears to have borderline vitamin B12 levels, some indices consistent with iron deficiency-continue iron/folic acid/vitamin G25 supplementation.  COPD: Stable-no exacerbation-continue bronchodilators.  Debility/deconditioning: PT/OT eval completed-SNF on discharge.   Diet: Diet Order            Diet Carb Modified Fluid consistency: Thin; Room service appropriate? No  Diet effective now                  Code Status: Full code   Family Communication Sister-Ingrid [phone] voicemail-11/25  Disposition Plan: Status is: Inpatient  Remains inpatient appropriate because:Inpatient level of care appropriate due to severity of illness   Dispo: The patient is from: Home              Anticipated d/c is to: SNF              Anticipated d/c date is: 2 days              Patient currently is not medically stable to d/c.  Barriers to Discharge: Aspiration pneumonia requiring IV antimicrobial therapy.  Antimicrobial agents: Anti-infectives (From admission, onward)   None       Time spent: 25 minutes-Greater than 50% of this time was spent in counseling, explanation of diagnosis, planning of further management, and coordination of care.  MEDICATIONS: Scheduled Meds: . aspirin  81 mg Oral Daily  . chlordiazePOXIDE  10 mg Oral TID  . cyanocobalamin  1,000 mcg Intramuscular Daily  .  feeding supplement  237 mL Oral TID BM  . ferrous sulfate  325 mg Oral BID WC  . folic acid  1 mg Oral Daily  . heparin  5,000 Units Subcutaneous Q8H  .  hydrocerin   Topical BID  . metoprolol tartrate  12.5 mg Oral BID  . multivitamin with minerals  1 tablet Oral Daily  . nicotine  14 mg Transdermal Daily  . pantoprazole  40 mg Oral Daily  . sodium bicarbonate  650 mg Oral TID  . tamsulosin  0.4 mg Oral Daily  . thiamine  100 mg Intravenous Daily   Continuous Infusions: PRN Meds:.acetaminophen, guaiFENesin-dextromethorphan, LORazepam **OR** LORazepam   PHYSICAL EXAM: Vital signs: Vitals:   07/22/20 0517 07/22/20 0809 07/22/20 0836 07/22/20 1212  BP:  136/77  133/87  Pulse:  (!) 109 (!) 122 (!) 101  Resp:    18  Temp:  99.7 F (37.6 C)  99.7 F (37.6 C)  TempSrc:  Oral  Oral  SpO2:    96%  Weight: 68.6 kg     Height:       Filed Weights   07/20/20 1504 07/21/20 0402 07/22/20 0517  Weight: 65.9 kg 69.5 kg 68.6 kg   Body mass index is 19.42 kg/m.   Gen Exam:Alert awake-not in any distress HEENT:atraumatic, normocephalic Chest: Transmitted upper airway sounds-coarse rhonchi scattered all over. CVS:S1S2 regular Abdomen:soft non tender, non distended Extremities:no edema Neurology: Non focal Skin: no rash  I have personally reviewed following labs and imaging studies  LABORATORY DATA: CBC: Recent Labs  Lab 07/19/20 1329 07/19/20 2250 07/20/20 0240 07/21/20 0206 07/22/20 0007  WBC 7.9  --   --  5.8 7.9  NEUTROABS  --   --   --  2.8 4.2  HGB 9.9* 11.2* 9.9* 8.0* 7.7*  HCT 31.2* 33.0* 29.0* 23.8* 23.5*  MCV 117.3*  --   --  107.7* 112.4*  PLT 296  --   --  310 960    Basic Metabolic Panel: Recent Labs  Lab 07/19/20 1329 07/19/20 1329 07/19/20 2244 07/19/20 2250 07/20/20 0240 07/20/20 0758 07/21/20 0206 07/22/20 0007  NA 134*   < >  --  132* 129* 134* 138 141  K 3.1*   < >  --  3.6 3.0* 3.3* 3.4* 4.0  CL 95*  --   --   --   --  90* 98 103  CO2 11*  --   --   --   --  13* 29 28  GLUCOSE 54*  --   --   --   --  80 118* 103*  BUN 19  --   --   --   --  23 13 12   CREATININE 1.83*  --   --   --   --   2.02* 1.31* 1.08  CALCIUM 9.3  --   --   --   --  8.7* 8.7* 8.6*  MG  --   --  2.0  --   --   --  2.0 1.6*   < > = values in this interval not displayed.    GFR: Estimated Creatinine Clearance: 68.8 mL/min (by C-G formula based on SCr of 1.08 mg/dL).  Liver Function Tests: Recent Labs  Lab 07/21/20 0206 07/22/20 0007  AST 17 26  ALT 9 10  ALKPHOS 68 58  BILITOT 0.7 0.3  PROT 5.3* 5.2*  ALBUMIN 2.8* 2.6*   No results for input(s): LIPASE, AMYLASE in the last 168  hours. No results for input(s): AMMONIA in the last 168 hours.  Coagulation Profile: No results for input(s): INR, PROTIME in the last 168 hours.  Cardiac Enzymes: No results for input(s): CKTOTAL, CKMB, CKMBINDEX, TROPONINI in the last 168 hours.  BNP (last 3 results) No results for input(s): PROBNP in the last 8760 hours.  Lipid Profile: No results for input(s): CHOL, HDL, LDLCALC, TRIG, CHOLHDL, LDLDIRECT in the last 72 hours.  Thyroid Function Tests: Recent Labs    07/20/20 1543  TSH 1.021    Anemia Panel: Recent Labs    07/20/20 0212 07/21/20 0836  VITAMINB12 326 325  FOLATE  --  5.3*  FERRITIN  --  604*  TIBC  --  182*  IRON  --  28*  RETICCTPCT  --  3.6*    Urine analysis:    Component Value Date/Time   COLORURINE YELLOW 07/20/2020 0933   APPEARANCEUR HAZY (A) 07/20/2020 0933   LABSPEC 1.014 07/20/2020 0933   PHURINE 5.0 07/20/2020 0933   GLUCOSEU NEGATIVE 07/20/2020 0933   HGBUR NEGATIVE 07/20/2020 0933   BILIRUBINUR NEGATIVE 07/20/2020 0933   KETONESUR 80 (A) 07/20/2020 0933   PROTEINUR NEGATIVE 07/20/2020 0933   NITRITE NEGATIVE 07/20/2020 0933   LEUKOCYTESUR NEGATIVE 07/20/2020 0933    Sepsis Labs: Lactic Acid, Venous    Component Value Date/Time   LATICACIDVEN 1.0 07/21/2020 0206    MICROBIOLOGY: Recent Results (from the past 240 hour(s))  Respiratory Panel by RT PCR (Flu A&B, Covid) - Nasopharyngeal Swab     Status: None   Collection Time: 07/19/20 11:07 PM    Specimen: Nasopharyngeal Swab; Nasopharyngeal(NP) swabs in vial transport medium  Result Value Ref Range Status   SARS Coronavirus 2 by RT PCR NEGATIVE NEGATIVE Final    Comment: (NOTE) SARS-CoV-2 target nucleic acids are NOT DETECTED.  The SARS-CoV-2 RNA is generally detectable in upper respiratoy specimens during the acute phase of infection. The lowest concentration of SARS-CoV-2 viral copies this assay can detect is 131 copies/mL. A negative result does not preclude SARS-Cov-2 infection and should not be used as the sole basis for treatment or other patient management decisions. A negative result may occur with  improper specimen collection/handling, submission of specimen other than nasopharyngeal swab, presence of viral mutation(s) within the areas targeted by this assay, and inadequate number of viral copies (<131 copies/mL). A negative result must be combined with clinical observations, patient history, and epidemiological information. The expected result is Negative.  Fact Sheet for Patients:  PinkCheek.be  Fact Sheet for Healthcare Providers:  GravelBags.it  This test is no t yet approved or cleared by the [place] FDA and  has been authorized for detection and/or diagnosis of SARS-CoV-2 by FDA under an Emergency Use Authorization (EUA). This EUA will remain  in effect (meaning this test can be used) for the duration of the COVID-19 declaration under Section 564(b)(1) of the Act, 21 U.S.C. section 360bbb-3(b)(1), unless the authorization is terminated or revoked sooner.     Influenza A by PCR NEGATIVE NEGATIVE Final   Influenza B by PCR NEGATIVE NEGATIVE Final    Comment: (NOTE) The Xpert Xpress SARS-CoV-2/FLU/RSV assay is intended as an aid in  the diagnosis of influenza from Nasopharyngeal swab specimens and  should not be used as a sole basis for treatment. Nasal washings and  aspirates are unacceptable  for Xpert Xpress SARS-CoV-2/FLU/RSV  testing.  Fact Sheet for Patients: PinkCheek.be  Fact Sheet for Healthcare Providers: GravelBags.it  This test is not yet approved  or cleared by the [place] and  has been authorized for detection and/or diagnosis of SARS-CoV-2 by  FDA under an Emergency Use Authorization (EUA). This EUA will remain  in effect (meaning this test can be used) for the duration of the  Covid-19 declaration under Section 564(b)(1) of the Act, 21  U.S.C. section 360bbb-3(b)(1), unless the authorization is  terminated or revoked. Performed at [facility], [place] [address], [place], [place] [address]     RADIOLOGY STUDIES/RESULTS: DG Chest Port 1V same Day  Result Date: 07/22/2020 CLINICAL DATA:  Shortness of breath EXAM: PORTABLE CHEST 1 VIEW COMPARISON:  July 19, 2020 FINDINGS: The cardiomediastinal silhouette is unchanged in contour when accounting for differences in technique.Tortuous thoracic aorta. Elevation the RIGHT hemidiaphragm, unchanged. No pleural effusion. No pneumothorax. RIGHT basilar heterogeneous opacities, new since prior. Faint LEFT basilar heterogeneous opacities. Visualized abdomen is unremarkable. Multilevel degenerative changes of the thoracic spine. IMPRESSION: Mild RIGHT greater than LEFT basilar heterogeneous opacities. Differential considerations include infection, aspiration or atelectasis. Electronically Signed   By: Valentino Saxon MD   On: 07/22/2020 09:23     LOS: 3 days   Oren Binet, MD  [facility]    To contact the attending provider between 7A-7P or the covering provider during after hours 7P-7A, please log into the web site [email or web address] and access using universal [place] password for that web site. If you do not have the password, please call the hospital operator.  07/22/2020, 3:58 PM

## 2020-07-22 NOTE — Plan of Care (Signed)
  Problem: Elimination: Goal: Will not experience complications related to bowel motility Outcome: Progressing   

## 2020-07-22 NOTE — Progress Notes (Signed)
Pt.has temp.of 100.2 & HR-120's-130's.MD on call made aware..Ordered to give Tylenol 650mg  po q 6hrs prn for fever.

## 2020-07-22 NOTE — Progress Notes (Signed)
Pharmacy Antibiotic Note  Seth [name] is a 64 y.o. male admitted on 07/19/2020 with suspicion for aspiration pneumonia.  Pharmacy has been consulted for Unasyn dosing.  Plan: Unasyn 3g IV every 6 hours for planned 5 day duration  Dose appropriate for renal function and indication.  Height: 6\' 2"  (188 cm) Weight: 68.6 kg (151 lb 3.8 oz) IBW/kg (Calculated) : 82.2  Temp (24hrs), Avg:99.7 F (37.6 C), Min:98.8 F (37.1 C), Max:100.2 F (37.9 C)  Recent Labs  Lab 07/19/20 1329 07/19/20 2244 07/20/20 0032 07/20/20 0758 07/21/20 0206 07/22/20 0007  WBC 7.9  --   --   --  5.8 7.9  CREATININE 1.83*  --   --  2.02* 1.31* 1.08  LATICACIDVEN  --  2.2* 2.1*  --  1.0  --     Estimated Creatinine Clearance: 68.8 mL/min (by C-G formula based on SCr of 1.08 mg/dL).    No Known Allergies  Thank you for allowing pharmacy to be a part of this patient's care.  Norina Buzzard, PharmD PGY1 Pharmacy Resident 07/22/2020 4:45 PM

## 2020-07-23 DIAGNOSIS — N179 Acute kidney failure, unspecified: Secondary | ICD-10-CM | POA: Diagnosis not present

## 2020-07-23 DIAGNOSIS — R296 Repeated falls: Secondary | ICD-10-CM | POA: Diagnosis not present

## 2020-07-23 DIAGNOSIS — E872 Acidosis: Secondary | ICD-10-CM | POA: Diagnosis not present

## 2020-07-23 DIAGNOSIS — F1020 Alcohol dependence, uncomplicated: Secondary | ICD-10-CM | POA: Diagnosis not present

## 2020-07-23 LAB — COMPREHENSIVE METABOLIC PANEL
ALT: 10 U/L (ref 0–44)
AST: 20 U/L (ref 15–41)
Albumin: 2.5 g/dL — ABNORMAL LOW (ref 3.5–5.0)
Alkaline Phosphatase: 63 U/L (ref 38–126)
Anion gap: 11 (ref 5–15)
BUN: 13 mg/dL (ref 8–23)
CO2: 26 mmol/L (ref 22–32)
Calcium: 8.8 mg/dL — ABNORMAL LOW (ref 8.9–10.3)
Chloride: 99 mmol/L (ref 98–111)
Creatinine, Ser: 1.16 mg/dL (ref 0.61–1.24)
GFR, Estimated: >60 mL/min (ref >60)
Glucose, Bld: 97 mg/dL (ref 70–99)
Potassium: 3.7 mmol/L (ref 3.5–5.1)
Sodium: 136 mmol/L (ref 135–145)
Total Bilirubin: 0.5 mg/dL (ref 0.3–1.2)
Total Protein: 5.3 g/dL — ABNORMAL LOW (ref 6.5–8.1)

## 2020-07-23 LAB — CBC WITH DIFFERENTIAL/PLATELET
Abs Immature Granulocytes: 0.12 10*3/uL — ABNORMAL HIGH (ref 0.00–0.07)
Basophils Absolute: 0 10*3/uL (ref 0.0–0.1)
Basophils Relative: 0 %
Eosinophils Absolute: 0.2 10*3/uL (ref 0.0–0.5)
Eosinophils Relative: 2 %
HCT: 24.3 % — ABNORMAL LOW (ref 39.0–52.0)
Hemoglobin: 8 g/dL — ABNORMAL LOW (ref 13.0–17.0)
Immature Granulocytes: 1 %
Lymphocytes Relative: 14 %
Lymphs Abs: 1.5 10*3/uL (ref 0.7–4.0)
MCH: 36.4 pg — ABNORMAL HIGH (ref 26.0–34.0)
MCHC: 32.9 g/dL (ref 30.0–36.0)
MCV: 110.5 fL — ABNORMAL HIGH (ref 80.0–100.0)
Monocytes Absolute: 2.3 10*3/uL — ABNORMAL HIGH (ref 0.1–1.0)
Monocytes Relative: 22 %
Neutro Abs: 6.5 10*3/uL (ref 1.7–7.7)
Neutrophils Relative %: 61 %
Platelets: 353 10*3/uL (ref 150–400)
RBC: 2.2 MIL/uL — ABNORMAL LOW (ref 4.22–5.81)
RDW: 17.1 % — ABNORMAL HIGH (ref 11.5–15.5)
WBC: 10.7 10*3/uL — ABNORMAL HIGH (ref 4.0–10.5)
nRBC: 0.3 % — ABNORMAL HIGH (ref 0.0–0.2)

## 2020-07-23 LAB — GLUCOSE, CAPILLARY
Glucose-Capillary: 86 mg/dL (ref 70–99)
Glucose-Capillary: 84 mg/dL (ref 70–99)
Glucose-Capillary: 96 mg/dL (ref 70–99)

## 2020-07-23 LAB — MAGNESIUM: Magnesium: 1.9 mg/dL (ref 1.7–2.4)

## 2020-07-23 LAB — PHOSPHORUS: Phosphorus: 1 mg/dL — CL (ref 2.5–4.6)

## 2020-07-23 MED ORDER — FINASTERIDE 5 MG PO TABS
5.0000 mg | ORAL_TABLET | Freq: Every day | ORAL | Status: DC
  Administered 2020-07-23 – 2020-07-31 (×9): 5 mg via ORAL
  Filled 2020-07-23 (×9): qty 1

## 2020-07-23 MED ORDER — CHLORDIAZEPOXIDE HCL 5 MG PO CAPS
5.0000 mg | ORAL_CAPSULE | Freq: Three times a day (TID) | ORAL | Status: DC
  Administered 2020-07-23 – 2020-07-24 (×3): 5 mg via ORAL
  Filled 2020-07-23 (×3): qty 1

## 2020-07-23 MED ORDER — MAGNESIUM SULFATE 2 GM/50ML IV SOLN
2.0000 g | Freq: Once | INTRAVENOUS | Status: AC
  Administered 2020-07-23: 2 g via INTRAVENOUS
  Filled 2020-07-23: qty 50

## 2020-07-23 MED ORDER — K PHOS MONO-SOD PHOS DI & MONO 155-852-130 MG PO TABS
500.0000 mg | ORAL_TABLET | Freq: Once | ORAL | Status: DC
  Administered 2020-07-23: 500 mg via ORAL
  Filled 2020-07-23: qty 2

## 2020-07-23 MED ORDER — K PHOS MONO-SOD PHOS DI & MONO 155-852-130 MG PO TABS
250.0000 mg | ORAL_TABLET | Freq: Two times a day (BID) | ORAL | Status: DC
  Administered 2020-07-23 – 2020-07-24 (×3): 250 mg via ORAL
  Filled 2020-07-23 (×4): qty 1

## 2020-07-23 MED ORDER — POTASSIUM PHOSPHATES 15 MMOLE/5ML IV SOLN
20.0000 mmol | Freq: Once | INTRAVENOUS | Status: AC
  Administered 2020-07-23: 20 mmol via INTRAVENOUS
  Filled 2020-07-23: qty 6.67

## 2020-07-23 NOTE — Progress Notes (Signed)
CRITICAL VALUE ALERT  Critical Value:  Phosphorous 1.0  Date & Time Notied:  07/23/20 @ 5207  Provider Notified: Chotiner, MD  Orders Received/Actions taken: Awaiting orders  Elaina Hoops, RN

## 2020-07-23 NOTE — Progress Notes (Signed)
PROGRESS NOTE        PATIENT DETAILS Name: Seth [name] Age: 64 y.o. Sex: male Date of Birth: [birth year] Admit Date: 07/19/2020 Admitting Physician Shela Leff, MD WLN:[id number], Myra Rude, MD  Brief Narrative: Patient is a 64 y.o. male with history of EtOH use, CVA, gout, HTN, BPH, depression-we will presented with presyncope-found to have AKI, lactic acidosis, hypotension and admitted to the hospitalist service.  Significant events: 11/22: Admit for dehydration/AKI/hypotension.  Significant studies: 11/22>> CT head: No acute intracranial abnormality. 11/22>> chest x-ray: No acute cardiopulmonary disease. 11/23>> Echo: EF 60-65% 11/23>> renal ultrasound: No hydronephrosis, 2 small right renal cysts 11/25>> chest x-ray: Right> left basilar opacities-likely pneumonia.  Antimicrobial therapy: Unasyn: 11/25>>  Microbiology data: 11/25 >>blood culture: No growth  Procedures : None  Consults: None  DVT Prophylaxis : Place TED hose Start: 07/20/20 1522 heparin injection 5,000 Units Start: 07/20/20 0600   Subjective: Feels better-looks very weak.  No major issues overnight per RN.  Assessment/Plan: Presyncope/frequent falls: Secondary to orthostatic hypotension in the setting of dehydration: Improved-volume status appears stable-continues to have orthostatic hypotension-continue supportive care-on thigh-high Ted stockings.  PT evaluation completed-recommendations of SNF on discharge.  AKI: Hemodynamically mediated in the setting of poor oral intake/dehydration.  Improved with supportive care.  Metabolic acidosis: Likely starvation ketosis-no indication of sepsis.  Improved with supportive care.  Check phosphorus levels in a.m.  Hypophosphatemia: Continue repletion-watch for refeeding syndrome.  Hypomagnesemia/hypokalemia: Continue to replete and monitor closely.  Aspiration pneumonia: Had low-grade fever/wheezing on 11/25-started on  Unasyn-with significant clinical improvement.  High suspicion for aspiration pneumonia given history of alcohol use.    EtOH use: Awake/alert-minimal tremors-decrease Librium to 5 mg 3 times daily-no longer on Ativan per CIWA protocol.    HTN: BP stable-continue to watch of antihypertensives-beta-blocker discontinued on 11/25 due to persistent orthostatic hypotension.  History of CVA: No focal deficits-continue aspirin  GERD: Continue PPI  BPH: Still continues to have significant orthostatic hypotension-stop Flomax-switch to finasteride.    Anemia: Multifactorial anemia (folate/vitamin B12/iron deficiency) worsened by IV fluid dilution.  No indication for transfusion-hemoglobin slowly improving.  Continue folic acid/vitamin J94/RDEY supplementation.    COPD: Stable-no exacerbation-continue bronchodilators.  Debility/deconditioning: PT/OT eval completed-SNF on discharge.   Diet: Diet Order            Diet Carb Modified Fluid consistency: Thin; Room service appropriate? No  Diet effective now                  Code Status: Full code   Family Communication Sister-Ingrid [phone] voicemail-11/25, 11/26  Disposition Plan: Status is: Inpatient  Remains inpatient appropriate because:Inpatient level of care appropriate due to severity of illness   Dispo: The patient is from: Home              Anticipated d/c is to: SNF              Anticipated d/c date is: 2 days              Patient currently is not medically stable to d/c.  Barriers to Discharge: Aspiration pneumonia requiring IV antimicrobial therapy.  Antimicrobial agents: Anti-infectives (From admission, onward)   Start     Dose/Rate Route Frequency Ordered Stop   07/22/20 1700  Ampicillin-Sulbactam (UNASYN) 3 g in sodium chloride 0.9 % 100 mL IVPB  3 g 200 mL/hr over 30 Minutes Intravenous Every 6 hours 07/22/20 1640         Time spent: 25 minutes-Greater than 50% of this time was spent in  counseling, explanation of diagnosis, planning of further management, and coordination of care.  MEDICATIONS: Scheduled Meds: . aspirin  81 mg Oral Daily  . chlordiazePOXIDE  10 mg Oral TID  . cyanocobalamin  1,000 mcg Intramuscular Daily  . feeding supplement  237 mL Oral TID BM  . ferrous sulfate  325 mg Oral BID WC  . fluticasone furoate-vilanterol  1 puff Inhalation Daily  . folic acid  1 mg Oral Daily  . heparin  5,000 Units Subcutaneous Q8H  . hydrocerin   Topical BID  . multivitamin with minerals  1 tablet Oral Daily  . nicotine  14 mg Transdermal Daily  . pantoprazole  40 mg Oral Daily  . sodium bicarbonate  650 mg Oral BID  . tamsulosin  0.4 mg Oral Daily  . thiamine  100 mg Intravenous Daily  . umeclidinium bromide  1 puff Inhalation Daily   Continuous Infusions: . ampicillin-sulbactam (UNASYN) IV 3 g (07/23/20 1303)  . potassium PHOSPHATE IVPB (in mmol) 20 mmol (07/23/20 1021)   PRN Meds:.acetaminophen, albuterol, guaiFENesin-dextromethorphan   PHYSICAL EXAM: Vital signs: Vitals:   07/23/20 0426 07/23/20 0726 07/23/20 0756 07/23/20 0827  BP: 137/81 137/80 138/79   Pulse: (!) 125 (!) 105 (!) 104   Resp: 20  20   Temp: 99.2 F (37.3 C)  98.9 F (37.2 C)   TempSrc: Oral  Oral   SpO2: 100% 100% 100% 100%  Weight: 69.1 kg     Height:       Filed Weights   07/21/20 0402 07/22/20 0517 07/23/20 0426  Weight: 69.5 kg 68.6 kg 69.1 kg   Body mass index is 19.56 kg/m.   Gen Exam:Alert awake-not in any distress-looks chronically sick appearing/frail. HEENT:atraumatic, normocephalic Chest: B/L clear to auscultation anteriorly CVS:S1S2 regular Abdomen:soft non tender, non distended Extremities:no edema Neurology: Non focal Skin: no rash  I have personally reviewed following labs and imaging studies  LABORATORY DATA: CBC: Recent Labs  Lab 07/19/20 1329 07/19/20 1329 07/19/20 2250 07/20/20 0240 07/21/20 0206 07/22/20 0007 07/23/20 0217  WBC 7.9  --    --   --  5.8 7.9 10.7*  NEUTROABS  --   --   --   --  2.8 4.2 6.5  HGB 9.9*   < > 11.2* 9.9* 8.0* 7.7* 8.0*  HCT 31.2*   < > 33.0* 29.0* 23.8* 23.5* 24.3*  MCV 117.3*  --   --   --  107.7* 112.4* 110.5*  PLT 296  --   --   --  310 319 353   < > = values in this interval not displayed.    Basic Metabolic Panel: Recent Labs  Lab 07/19/20 1329 07/19/20 2244 07/19/20 2250 07/20/20 0240 07/20/20 0758 07/21/20 0206 07/22/20 0007 07/23/20 0217  NA 134*  --    < > 129* 134* 138 141 136  K 3.1*  --    < > 3.0* 3.3* 3.4* 4.0 3.7  CL 95*  --   --   --  90* 98 103 99  CO2 11*  --   --   --  13* 29 28 26   GLUCOSE 54*  --   --   --  80 118* 103* 97  BUN 19  --   --   --  23 13  12 13  CREATININE 1.83*  --   --   --  2.02* 1.31* 1.08 1.16  CALCIUM 9.3  --   --   --  8.7* 8.7* 8.6* 8.8*  MG  --  2.0  --   --   --  2.0 1.6* 1.9  PHOS  --   --   --   --   --   --   --  1.0*   < > = values in this interval not displayed.    GFR: Estimated Creatinine Clearance: 64.5 mL/min (by C-G formula based on SCr of 1.16 mg/dL).  Liver Function Tests: Recent Labs  Lab 07/21/20 0206 07/22/20 0007 07/23/20 0217  AST 17 26 20   ALT 9 10 10   ALKPHOS 68 58 63  BILITOT 0.7 0.3 0.5  PROT 5.3* 5.2* 5.3*  ALBUMIN 2.8* 2.6* 2.5*   No results for input(s): LIPASE, AMYLASE in the last 168 hours. No results for input(s): AMMONIA in the last 168 hours.  Coagulation Profile: No results for input(s): INR, PROTIME in the last 168 hours.  Cardiac Enzymes: No results for input(s): CKTOTAL, CKMB, CKMBINDEX, TROPONINI in the last 168 hours.  BNP (last 3 results) No results for input(s): PROBNP in the last 8760 hours.  Lipid Profile: No results for input(s): CHOL, HDL, LDLCALC, TRIG, CHOLHDL, LDLDIRECT in the last 72 hours.  Thyroid Function Tests: No results for input(s): TSH, T4TOTAL, FREET4, T3FREE, THYROIDAB in the last 72 hours.  Anemia Panel: Recent Labs    07/21/20 0836  VITAMINB12 325  FOLATE  5.3*  FERRITIN 604*  TIBC 182*  IRON 28*  RETICCTPCT 3.6*    Urine analysis:    Component Value Date/Time   COLORURINE YELLOW 07/20/2020 0933   APPEARANCEUR HAZY (A) 07/20/2020 0933   LABSPEC 1.014 07/20/2020 0933   PHURINE 5.0 07/20/2020 0933   GLUCOSEU NEGATIVE 07/20/2020 0933   HGBUR NEGATIVE 07/20/2020 0933   BILIRUBINUR NEGATIVE 07/20/2020 0933   KETONESUR 80 (A) 07/20/2020 0933   PROTEINUR NEGATIVE 07/20/2020 0933   NITRITE NEGATIVE 07/20/2020 0933   LEUKOCYTESUR NEGATIVE 07/20/2020 0933    Sepsis Labs: Lactic Acid, Venous    Component Value Date/Time   LATICACIDVEN 1.0 07/21/2020 0206    MICROBIOLOGY: Recent Results (from the past 240 hour(s))  Respiratory Panel by RT PCR (Flu A&B, Covid) - Nasopharyngeal Swab     Status: None   Collection Time: 07/19/20 11:07 PM   Specimen: Nasopharyngeal Swab; Nasopharyngeal(NP) swabs in vial transport medium  Result Value Ref Range Status   SARS Coronavirus 2 by RT PCR NEGATIVE NEGATIVE Final    Comment: (NOTE) SARS-CoV-2 target nucleic acids are NOT DETECTED.  The SARS-CoV-2 RNA is generally detectable in upper respiratoy specimens during the acute phase of infection. The lowest concentration of SARS-CoV-2 viral copies this assay can detect is 131 copies/mL. A negative result does not preclude SARS-Cov-2 infection and should not be used as the sole basis for treatment or other patient management decisions. A negative result may occur with  improper specimen collection/handling, submission of specimen other than nasopharyngeal swab, presence of viral mutation(s) within the areas targeted by this assay, and inadequate number of viral copies (<131 copies/mL). A negative result must be combined with clinical observations, patient history, and epidemiological information. The expected result is Negative.  Fact Sheet for Patients:  PinkCheek.be  Fact Sheet for Healthcare Providers:   GravelBags.it  This test is no t yet approved or cleared by the [place] and  has been authorized for detection and/or diagnosis of SARS-CoV-2 by FDA under an Emergency Use Authorization (EUA). This EUA will remain  in effect (meaning this test can be used) for the duration of the COVID-19 declaration under Section 564(b)(1) of the Act, 21 U.S.C. section 360bbb-3(b)(1), unless the authorization is terminated or revoked sooner.     Influenza A by PCR NEGATIVE NEGATIVE Final   Influenza B by PCR NEGATIVE NEGATIVE Final    Comment: (NOTE) The Xpert Xpress SARS-CoV-2/FLU/RSV assay is intended as an aid in  the diagnosis of influenza from Nasopharyngeal swab specimens and  should not be used as a sole basis for treatment. Nasal washings and  aspirates are unacceptable for Xpert Xpress SARS-CoV-2/FLU/RSV  testing.  Fact Sheet for Patients: PinkCheek.be  Fact Sheet for Healthcare Providers: GravelBags.it  This test is not yet approved or cleared by the [place] FDA and  has been authorized for detection and/or diagnosis of SARS-CoV-2 by  FDA under an Emergency Use Authorization (EUA). This EUA will remain  in effect (meaning this test can be used) for the duration of the  Covid-19 declaration under Section 564(b)(1) of the Act, 21  U.S.C. section 360bbb-3(b)(1), unless the authorization is  terminated or revoked. Performed at [facility], [place] [address] [place], [place] [address]   Culture, blood (routine x 2)     Status: None (Preliminary result)   Collection Time: 07/22/20  4:32 PM   Specimen: BLOOD  Result Value Ref Range Status   Specimen Description BLOOD RIGHT ANTECUBITAL  Final   Special Requests   Final    BOTTLES DRAWN AEROBIC AND ANAEROBIC Blood Culture adequate volume   Culture   Final    NO GROWTH < 24 HOURS Performed at [facility], [place] [address] [place], [place] [address]    Report Status PENDING  Incomplete  Culture, blood (routine x 2)     Status: None (Preliminary result)   Collection Time: 07/22/20  4:39 PM   Specimen: BLOOD LEFT ARM  Result Value Ref Range Status   Specimen Description BLOOD LEFT ARM  Final   Special Requests   Final    BOTTLES DRAWN AEROBIC AND ANAEROBIC Blood Culture adequate volume   Culture   Final    NO GROWTH < 24 HOURS Performed at [facility], [place] [address], [place], [place] [address]    Report Status PENDING  Incomplete    RADIOLOGY STUDIES/RESULTS: DG Chest Port 1V same Day  Result Date: 07/22/2020 CLINICAL DATA:  Shortness of breath EXAM: PORTABLE CHEST 1 VIEW COMPARISON:  July 19, 2020 FINDINGS: The cardiomediastinal silhouette is unchanged in contour when accounting for differences in technique.Tortuous thoracic aorta. Elevation the RIGHT hemidiaphragm, unchanged. No pleural effusion. No pneumothorax. RIGHT basilar heterogeneous opacities, new since prior. Faint LEFT basilar heterogeneous opacities. Visualized abdomen is unremarkable. Multilevel degenerative changes of the thoracic spine. IMPRESSION: Mild RIGHT greater than LEFT basilar heterogeneous opacities. Differential considerations include infection, aspiration or atelectasis. Electronically Signed   By: Valentino Saxon MD   On: 07/22/2020 09:23     LOS: 4 days   Oren Binet, MD  [facility]    To contact the attending provider between 7A-7P or the covering provider during after hours 7P-7A, please log into the web site [email or web address] and access using universal [place] password for that web site. If you do not have the password, please call the hospital operator.  07/23/2020, 3:45 PM

## 2020-07-23 NOTE — Progress Notes (Addendum)
Physical Therapy Treatment Patient Details Name: Seth [name] MRN: [id number] DOB: 04-20-1956 Today's Date: 07/23/2020    History of Present Illness 64 yo male with onset of dizziness and SOB with HA's was admitted.  Found AKI, orthostatic BP's, low BS and low K+.  PMHx:  c-spine fusion, gout, HTN, EtOH abuse, COPD, B cerebellar strokes, atherosclerosisl    PT Comments    Pt remains very weak even though BP is much improved. Unable to take any step to the recliner and used Stedy to get pt from bed to chair. Continue to feel pt will need SNF at DC.    Orthostatic BPs  Supine 114/68  Sitting 98/63  Standing 94/64     Follow Up Recommendations  SNF     Equipment Recommendations  Wheelchair (measurements PT);Wheelchair cushion (measurements PT);Rolling [place] with 5" wheels    Recommendations for Other Services       Precautions / Restrictions Precautions Precautions: Fall Precaution Comments: watch BP    Mobility  Bed Mobility Overal bed mobility: Needs Assistance Bed Mobility: Supine to Sit     Supine to sit: Mod assist     General bed mobility comments: Assist to bring legs off of bed, elevate trunk into sitting and bring hips to EOB.  Transfers Overall transfer level: Needs assistance Equipment used: Rolling [place] (2 wheeled);Ambulation equipment used Transfers: Sit to/from Stand Sit to Stand: +2 physical assistance;Mod assist;From elevated surface         General transfer comment: Assist to bring hips up and for balance. Stood with RW on first trial. Pt unable to take pivotal steps with Rehm toward chair so returned to sitting EOB. Stood 2nd time with [place] and used Stedy for bed to chair. Pt not able to fully extend hips and trunk.   Ambulation/Gait             General Gait Details: Pt unable   Stairs             Wheelchair Mobility    Modified Rankin (Stroke Patients Only)       Balance Overall balance assessment: Needs  assistance Sitting-balance support: Feet supported;Bilateral upper extremity supported Sitting balance-[name] Scale: Poor Sitting balance - Comments: Pt sat EOB x 8-10 minutes with UE support and intermittent min assist Postural control: Posterior lean Standing balance support: Bilateral upper extremity supported;During functional activity Standing balance-[name] Scale: Poor Standing balance comment: +2 mod assist with static standing with Martine or Stedy. Pt with posterior lean and bracing calves on bed.                             Cognition Arousal/Alertness: Awake/alert Behavior During Therapy: Flat affect Overall Cognitive Status: Within Functional Limits for tasks assessed                                        Exercises      General Comments General comments (skin integrity, edema, etc.): See assessment for BP's      Pertinent Vitals/Pain Pain Assessment: No/denies pain    Home Living                      Prior Function            PT Goals (current goals can now be found in the care plan section) Acute Rehab PT  Goals Patient Stated Goal: to feel better PT Goal Formulation: With patient Time For Goal Achievement: 08/06/20 Potential to Achieve Goals: Fair Progress towards PT goals: Not progressing toward goals - comment;Goals downgraded-see care plan (very weak with low activity tolerance)    Frequency    Min 2X/week      PT Plan Current plan remains appropriate;Frequency needs to be updated    Co-evaluation              AM-PAC PT "6 Clicks" Mobility   Outcome Measure  Help needed turning from your back to your side while in a flat bed without using bedrails?: A Lot Help needed moving from lying on your back to sitting on the side of a flat bed without using bedrails?: A Lot Help needed moving to and from a bed to a chair (including a wheelchair)?: Total Help needed standing up from a chair using your arms (e.g.,  wheelchair or bedside chair)?: A Lot Help needed to walk in hospital room?: Total Help needed climbing 3-5 steps with a railing? : Total 6 Click Score: 9    End of Session   Activity Tolerance: Patient limited by fatigue Patient left: in chair;with call bell/phone within reach Nurse Communication: Mobility status;Need for lift equipment PT Visit Diagnosis: Unsteadiness on feet (R26.81);Muscle weakness (generalized) (M62.81);Difficulty in walking, not elsewhere classified (R26.2)     Time: 5449-2010 PT Time Calculation (min) (ACUTE ONLY): 26 min  Charges:  $Therapeutic Activity: 23-37 mins                     [place] Pager [phone] Office [place] 07/23/2020, 1:54 PM

## 2020-07-23 NOTE — Care Management Important Message (Signed)
Important Message  Patient Details  Name: AMBROSE WILE MRN: [id number] Date of Birth: 09/28/1955   Medicare Important Message Given:  Yes     [name] 07/23/2020, 8:55 AM

## 2020-07-24 DIAGNOSIS — F1020 Alcohol dependence, uncomplicated: Secondary | ICD-10-CM | POA: Diagnosis not present

## 2020-07-24 DIAGNOSIS — N179 Acute kidney failure, unspecified: Secondary | ICD-10-CM | POA: Diagnosis not present

## 2020-07-24 DIAGNOSIS — R296 Repeated falls: Secondary | ICD-10-CM | POA: Diagnosis not present

## 2020-07-24 DIAGNOSIS — E872 Acidosis: Secondary | ICD-10-CM | POA: Diagnosis not present

## 2020-07-24 LAB — CBC WITH DIFFERENTIAL/PLATELET
Abs Immature Granulocytes: 0.11 10*3/uL — ABNORMAL HIGH (ref 0.00–0.07)
Basophils Absolute: 0 10*3/uL (ref 0.0–0.1)
Basophils Relative: 0 %
Eosinophils Absolute: 0.2 10*3/uL (ref 0.0–0.5)
Eosinophils Relative: 1 %
HCT: 24.5 % — ABNORMAL LOW (ref 39.0–52.0)
Hemoglobin: 7.9 g/dL — ABNORMAL LOW (ref 13.0–17.0)
Immature Granulocytes: 1 %
Lymphocytes Relative: 10 %
Lymphs Abs: 1.2 10*3/uL (ref 0.7–4.0)
MCH: 35.6 pg — ABNORMAL HIGH (ref 26.0–34.0)
MCHC: 32.2 g/dL (ref 30.0–36.0)
MCV: 110.4 fL — ABNORMAL HIGH (ref 80.0–100.0)
Monocytes Absolute: 2.2 10*3/uL — ABNORMAL HIGH (ref 0.1–1.0)
Monocytes Relative: 17 %
Neutro Abs: 8.9 10*3/uL — ABNORMAL HIGH (ref 1.7–7.7)
Neutrophils Relative %: 71 %
Platelets: 379 10*3/uL (ref 150–400)
RBC: 2.22 MIL/uL — ABNORMAL LOW (ref 4.22–5.81)
RDW: 17.1 % — ABNORMAL HIGH (ref 11.5–15.5)
WBC: 12.6 10*3/uL — ABNORMAL HIGH (ref 4.0–10.5)
nRBC: 0.2 % (ref 0.0–0.2)

## 2020-07-24 LAB — COMPREHENSIVE METABOLIC PANEL
ALT: 9 U/L (ref 0–44)
AST: 16 U/L (ref 15–41)
Albumin: 2.4 g/dL — ABNORMAL LOW (ref 3.5–5.0)
Alkaline Phosphatase: 67 U/L (ref 38–126)
Anion gap: 13 (ref 5–15)
BUN: 12 mg/dL (ref 8–23)
CO2: 24 mmol/L (ref 22–32)
Calcium: 8.6 mg/dL — ABNORMAL LOW (ref 8.9–10.3)
Chloride: 101 mmol/L (ref 98–111)
Creatinine, Ser: 1.14 mg/dL (ref 0.61–1.24)
GFR, Estimated: >60 mL/min (ref >60)
Glucose, Bld: 97 mg/dL (ref 70–99)
Potassium: 4 mmol/L (ref 3.5–5.1)
Sodium: 138 mmol/L (ref 135–145)
Total Bilirubin: 0.5 mg/dL (ref 0.3–1.2)
Total Protein: 5.3 g/dL — ABNORMAL LOW (ref 6.5–8.1)

## 2020-07-24 LAB — GLUCOSE, CAPILLARY
Glucose-Capillary: 87 mg/dL (ref 70–99)
Glucose-Capillary: 81 mg/dL (ref 70–99)
Glucose-Capillary: 143 mg/dL — ABNORMAL HIGH (ref 70–99)
Glucose-Capillary: 93 mg/dL (ref 70–99)

## 2020-07-24 LAB — PHOSPHORUS: Phosphorus: 3.7 mg/dL (ref 2.5–4.6)

## 2020-07-24 LAB — MAGNESIUM: Magnesium: 1.8 mg/dL (ref 1.7–2.4)

## 2020-07-24 MED ORDER — MAGNESIUM SULFATE 2 GM/50ML IV SOLN
2.0000 g | Freq: Once | INTRAVENOUS | Status: AC
  Administered 2020-07-24: 2 g via INTRAVENOUS
  Filled 2020-07-24: qty 50

## 2020-07-24 MED ORDER — CHLORDIAZEPOXIDE HCL 5 MG PO CAPS
5.0000 mg | ORAL_CAPSULE | Freq: Two times a day (BID) | ORAL | Status: DC
  Administered 2020-07-24: 5 mg via ORAL
  Filled 2020-07-24: qty 1

## 2020-07-24 NOTE — Progress Notes (Signed)
PROGRESS NOTE        PATIENT DETAILS Name: Seth [name] Age: 64 y.o. Sex: male Date of Birth: August 17, 1956 Admit Date: 07/19/2020 Admitting Physician Shela Leff, MD [id number], Myra Rude, MD  Brief Narrative: Patient is a 64 y.o. male with history of EtOH use, CVA, gout, HTN, BPH, depression-we will presented with presyncope-found to have AKI, lactic acidosis, hypotension and admitted to the hospitalist service.  Significant events: 11/22: Admit for dehydration/AKI/hypotension.  Significant studies: 11/22>> CT head: No acute intracranial abnormality. 11/22>> chest x-ray: No acute cardiopulmonary disease. 11/23>> Echo: EF 60-65% 11/23>> renal ultrasound: No hydronephrosis, 2 small right renal cysts 11/25>> chest x-ray: Right> left basilar opacities-likely pneumonia.  Antimicrobial therapy: Unasyn: 11/25>>  Microbiology data: 11/25 >>blood culture: No growth  Procedures : None  Consults: None  DVT Prophylaxis : Place TED hose Start: 07/20/20 1522 heparin injection 5,000 Units Start: 07/20/20 0600   Subjective: Lying comfortably in bed-denies any chest pain or shortness of breath.  Breathing is fair per patient.  Per nursing staff-no major events overnight.  Assessment/Plan: Presyncope/frequent falls: Secondary to orthostatic hypotension in the setting of dehydration: Improved-volume status appears stable-continues to have orthostatic hypotension-continue supportive care-on thigh-high Ted stockings.  PT evaluation completed-recommendations of SNF on discharge.  Await orthostatic vital signs today.  AKI: Hemodynamically mediated in the setting of poor oral intake/dehydration.  Improved with supportive care.  Metabolic acidosis: Likely starvation ketosis-no indication of sepsis.  Improved with supportive care.    Hypophosphatemia: Secondary to alcohol use-repleted-continue to recheck periodically-watch for refeeding  syndrome.  Hypomagnesemia/hypokalemia: Secondary to alcohol use-continue to replete-recheck electrolytes tomorrow.    Aspiration pneumonia: Had low-grade fever/wheezing on 11/25-started on Unasyn-with significant clinical improvement.  High suspicion for aspiration pneumonia given history of alcohol use.    EtOH use: Awake/alert-minimal tremors-decrease Librium to 5 mg 3 times daily-no longer on Ativan per CIWA protocol.    HTN: BP stable-currently off all antihypertensives given orthostatic hypotension.  History of CVA: No focal deficits-continue aspirin  GERD: Continue PPI  BPH: Due to orthostatic hypotension-Flomax stopped-on finasteride.  Follow.    Anemia: Multifactorial anemia (folate/vitamin B12/iron deficiency) worsened by IV fluid dilution.  No indication for transfusion-hemoglobin slowly improving.  Continue folic acid/vitamin S06/TKZS supplementation.    COPD: Stable-no exacerbation-continue bronchodilators.  Debility/deconditioning: PT/OT eval completed-SNF on discharge.   Diet: Diet Order            Diet Carb Modified Fluid consistency: Thin; Room service appropriate? No  Diet effective now                  Code Status: Full code   Family Communication Sister-Ingrid [phone] voicemail-11/25, 11/26  Disposition Plan: Status is: Inpatient  Remains inpatient appropriate because:Inpatient level of care appropriate due to severity of illness   Dispo: The patient is from: Home              Anticipated d/c is to: SNF              Anticipated d/c date is: 2 days              Patient currently is not medically stable to d/c.  Barriers to Discharge: Aspiration pneumonia requiring IV antimicrobial therapy.  Antimicrobial agents: Anti-infectives (From admission, onward)   Start     Dose/Rate Route Frequency Ordered Stop   07/22/20 1700  Ampicillin-Sulbactam (UNASYN)  3 g in sodium chloride 0.9 % 100 mL IVPB        3 g 200 mL/hr over 30 Minutes  Intravenous Every 6 hours 07/22/20 1640         Time spent: 25 minutes-Greater than 50% of this time was spent in counseling, explanation of diagnosis, planning of further management, and coordination of care.  MEDICATIONS: Scheduled Meds: . aspirin  81 mg Oral Daily  . chlordiazePOXIDE  5 mg Oral TID  . cyanocobalamin  1,000 mcg Intramuscular Daily  . feeding supplement  237 mL Oral TID BM  . ferrous sulfate  325 mg Oral BID WC  . finasteride  5 mg Oral Daily  . fluticasone furoate-vilanterol  1 puff Inhalation Daily  . folic acid  1 mg Oral Daily  . heparin  5,000 Units Subcutaneous Q8H  . hydrocerin   Topical BID  . multivitamin with minerals  1 tablet Oral Daily  . nicotine  14 mg Transdermal Daily  . pantoprazole  40 mg Oral Daily  . phosphorus  250 mg Oral BID  . thiamine  100 mg Intravenous Daily  . umeclidinium bromide  1 puff Inhalation Daily   Continuous Infusions: . ampicillin-sulbactam (UNASYN) IV 3 g (07/24/20 1222)   PRN Meds:.acetaminophen, albuterol, guaiFENesin-dextromethorphan   PHYSICAL EXAM: Vital signs: Vitals:   07/24/20 0059 07/24/20 0447 07/24/20 0752 07/24/20 0753  BP: (!) 150/86 139/78  (!) 147/95  Pulse: (!) 117 (!) 118 (!) 114 (!) 110  Resp:  18 18 18   Temp:  99.4 F (37.4 C)  99.4 F (37.4 C)  TempSrc:  Oral  Oral  SpO2:  100% 100% 100%  Weight:  70.8 kg    Height:       Filed Weights   07/22/20 0517 07/23/20 0426 07/24/20 0447  Weight: 68.6 kg 69.1 kg 70.8 kg   Body mass index is 20.04 kg/m.   Gen Exam:Alert awake-not in any distress HEENT:atraumatic, normocephalic Chest: B/L clear to auscultation anteriorly CVS:S1S2 regular Abdomen:soft non tender, non distended Extremities:no edema Neurology: Non focal Skin: no rash  I have personally reviewed following labs and imaging studies  LABORATORY DATA: CBC: Recent Labs  Lab 07/19/20 1329 07/19/20 2250 07/20/20 0240 07/21/20 0206 07/22/20 0007 07/23/20 0217  07/24/20 0344  WBC 7.9  --   --  5.8 7.9 10.7* 12.6*  NEUTROABS  --   --   --  2.8 4.2 6.5 8.9*  HGB 9.9*   < > 9.9* 8.0* 7.7* 8.0* 7.9*  HCT 31.2*   < > 29.0* 23.8* 23.5* 24.3* 24.5*  MCV 117.3*  --   --  107.7* 112.4* 110.5* 110.4*  PLT 296  --   --  310 319 353 379   < > = values in this interval not displayed.    Basic Metabolic Panel: Recent Labs  Lab 07/19/20 2244 07/19/20 2250 07/20/20 0758 07/21/20 0206 07/22/20 0007 07/23/20 0217 07/24/20 0344  NA  --    < > 134* 138 141 136 138  K  --    < > 3.3* 3.4* 4.0 3.7 4.0  CL  --   --  90* 98 103 99 101  CO2  --   --  13* 29 28 26 24   GLUCOSE  --   --  80 118* 103* 97 97  BUN  --   --  23 13 12 13 12   CREATININE  --   --  2.02* 1.31* 1.08 1.16 1.14  CALCIUM  --   --  8.7* 8.7* 8.6* 8.8* 8.6*  MG 2.0  --   --  2.0 1.6* 1.9 1.8  PHOS  --   --   --   --   --  1.0* 3.7   < > = values in this interval not displayed.    GFR: Estimated Creatinine Clearance: 67.3 mL/min (by C-G formula based on SCr of 1.14 mg/dL).  Liver Function Tests: Recent Labs  Lab 07/21/20 0206 07/22/20 0007 07/23/20 0217 07/24/20 0344  AST 17 26 20 16   ALT 9 10 10 9   ALKPHOS 68 58 63 67  BILITOT 0.7 0.3 0.5 0.5  PROT 5.3* 5.2* 5.3* 5.3*  ALBUMIN 2.8* 2.6* 2.5* 2.4*   No results for input(s): LIPASE, AMYLASE in the last 168 hours. No results for input(s): AMMONIA in the last 168 hours.  Coagulation Profile: No results for input(s): INR, PROTIME in the last 168 hours.  Cardiac Enzymes: No results for input(s): CKTOTAL, CKMB, CKMBINDEX, TROPONINI in the last 168 hours.  BNP (last 3 results) No results for input(s): PROBNP in the last 8760 hours.  Lipid Profile: No results for input(s): CHOL, HDL, LDLCALC, TRIG, CHOLHDL, LDLDIRECT in the last 72 hours.  Thyroid Function Tests: No results for input(s): TSH, T4TOTAL, FREET4, T3FREE, THYROIDAB in the last 72 hours.  Anemia Panel: No results for input(s): VITAMINB12, FOLATE, FERRITIN,  TIBC, IRON, RETICCTPCT in the last 72 hours.  Urine analysis:    Component Value Date/Time   COLORURINE YELLOW 07/20/2020 0933   APPEARANCEUR HAZY (A) 07/20/2020 0933   LABSPEC 1.014 07/20/2020 0933   PHURINE 5.0 07/20/2020 0933   GLUCOSEU NEGATIVE 07/20/2020 0933   HGBUR NEGATIVE 07/20/2020 0933   BILIRUBINUR NEGATIVE 07/20/2020 0933   KETONESUR 80 (A) 07/20/2020 0933   PROTEINUR NEGATIVE 07/20/2020 0933   NITRITE NEGATIVE 07/20/2020 0933   LEUKOCYTESUR NEGATIVE 07/20/2020 0933    Sepsis Labs: Lactic Acid, Venous    Component Value Date/Time   LATICACIDVEN 1.0 07/21/2020 0206    MICROBIOLOGY: Recent Results (from the past 240 hour(s))  Respiratory Panel by RT PCR (Flu A&B, Covid) - Nasopharyngeal Swab     Status: None   Collection Time: 07/19/20 11:07 PM   Specimen: Nasopharyngeal Swab; Nasopharyngeal(NP) swabs in vial transport medium  Result Value Ref Range Status   SARS Coronavirus 2 by RT PCR NEGATIVE NEGATIVE Final    Comment: (NOTE) SARS-CoV-2 target nucleic acids are NOT DETECTED.  The SARS-CoV-2 RNA is generally detectable in upper respiratoy specimens during the acute phase of infection. The lowest concentration of SARS-CoV-2 viral copies this assay can detect is 131 copies/mL. A negative result does not preclude SARS-Cov-2 infection and should not be used as the sole basis for treatment or other patient management decisions. A negative result may occur with  improper specimen collection/handling, submission of specimen other than nasopharyngeal swab, presence of viral mutation(s) within the areas targeted by this assay, and inadequate number of viral copies (<131 copies/mL). A negative result must be combined with clinical observations, patient history, and epidemiological information. The expected result is Negative.  Fact Sheet for Patients:  PinkCheek.be  Fact Sheet for Healthcare Providers:   GravelBags.it  This test is no t yet approved or cleared by the [place] FDA and  has been authorized for detection and/or diagnosis of SARS-CoV-2 by FDA under an Emergency Use Authorization (EUA). This EUA will remain  in effect (meaning this test can be used) for the duration of the COVID-19 declaration under Section 564(b)(1) of the Act,  21 U.S.C. section 360bbb-3(b)(1), unless the authorization is terminated or revoked sooner.     Influenza A by PCR NEGATIVE NEGATIVE Final   Influenza B by PCR NEGATIVE NEGATIVE Final    Comment: (NOTE) The Xpert Xpress SARS-CoV-2/FLU/RSV assay is intended as an aid in  the diagnosis of influenza from Nasopharyngeal swab specimens and  should not be used as a sole basis for treatment. Nasal washings and  aspirates are unacceptable for Xpert Xpress SARS-CoV-2/FLU/RSV  testing.  Fact Sheet for Patients: PinkCheek.be  Fact Sheet for Healthcare Providers: GravelBags.it  This test is not yet approved or cleared by the [place] FDA and  has been authorized for detection and/or diagnosis of SARS-CoV-2 by  FDA under an Emergency Use Authorization (EUA). This EUA will remain  in effect (meaning this test can be used) for the duration of the  Covid-19 declaration under Section 564(b)(1) of the Act, 21  U.S.C. section 360bbb-3(b)(1), unless the authorization is  terminated or revoked. Performed at [facility], [place] [address] [place], [place] [address]   Culture, blood (routine x 2)     Status: None (Preliminary result)   Collection Time: 07/22/20  4:32 PM   Specimen: BLOOD  Result Value Ref Range Status   Specimen Description BLOOD RIGHT ANTECUBITAL  Final   Special Requests   Final    BOTTLES DRAWN AEROBIC AND ANAEROBIC Blood Culture adequate volume   Culture   Final    NO GROWTH 2 DAYS Performed at [facility], [place] [address]  [place], [place] [address]    Report Status PENDING  Incomplete  Culture, blood (routine x 2)     Status: None (Preliminary result)   Collection Time: 07/22/20  4:39 PM   Specimen: BLOOD LEFT ARM  Result Value Ref Range Status   Specimen Description BLOOD LEFT ARM  Final   Special Requests   Final    BOTTLES DRAWN AEROBIC AND ANAEROBIC Blood Culture adequate volume   Culture   Final    NO GROWTH 2 DAYS Performed at [facility], [place] [address] [place], [place] [address]    Report Status PENDING  Incomplete    RADIOLOGY STUDIES/RESULTS: No results found.   LOS: 5 days   Oren Binet, MD  [facility]    To contact the attending provider between 7A-7P or the covering provider during after hours 7P-7A, please log into the web site [email or web address] and access using universal [place] password for that web site. If you do not have the password, please call the hospital operator.  07/24/2020, 2:43 PM

## 2020-07-24 NOTE — TOC Progression Note (Signed)
Transition of Care [facility]) - Progression Note    Patient Details  Name: Seth [name] MRN: [id number] Date of Birth: [birth year]  Transition of Care [facility]) CM/SW [place], [place] Phone Number: [phone] 07/24/2020, 4:40 PM  Clinical Narrative:     Patient currently does not have any bed offers.  TOC team will continue to assist with discharge planning needs.    Expected Discharge Plan: [place] Barriers to Discharge: Continued Medical Work up  Expected Discharge Plan and Services Expected Discharge Plan: [place] arrangements for the past 2 months: Single Family Home                                       Social Determinants of Health (SDOH) Interventions    Readmission Risk Interventions Readmission Risk Prevention Plan 06/04/2019  Transportation Screening Complete  PCP or Specialist Appt within 5-7 Days Complete  Home Care Screening Complete  Medication Review (RN CM) Complete  Some recent data might be hidden

## 2020-07-24 NOTE — Plan of Care (Signed)
  Problem: Coping: Goal: Level of anxiety will decrease Outcome: Progressing   Problem: Elimination: Goal: Will not experience complications related to urinary retention Outcome: Progressing   

## 2020-07-25 DIAGNOSIS — E872 Acidosis: Secondary | ICD-10-CM | POA: Diagnosis not present

## 2020-07-25 DIAGNOSIS — N179 Acute kidney failure, unspecified: Secondary | ICD-10-CM | POA: Diagnosis not present

## 2020-07-25 DIAGNOSIS — F1020 Alcohol dependence, uncomplicated: Secondary | ICD-10-CM | POA: Diagnosis not present

## 2020-07-25 DIAGNOSIS — R296 Repeated falls: Secondary | ICD-10-CM | POA: Diagnosis not present

## 2020-07-25 LAB — BASIC METABOLIC PANEL
Anion gap: 11 (ref 5–15)
BUN: 12 mg/dL (ref 8–23)
CO2: 24 mmol/L (ref 22–32)
Calcium: 8.6 mg/dL — ABNORMAL LOW (ref 8.9–10.3)
Chloride: 102 mmol/L (ref 98–111)
Creatinine, Ser: 1.1 mg/dL (ref 0.61–1.24)
GFR, Estimated: >60 mL/min (ref >60)
Glucose, Bld: 92 mg/dL (ref 70–99)
Potassium: 3.9 mmol/L (ref 3.5–5.1)
Sodium: 137 mmol/L (ref 135–145)

## 2020-07-25 LAB — CBC
HCT: 22.9 % — ABNORMAL LOW (ref 39.0–52.0)
Hemoglobin: 7.6 g/dL — ABNORMAL LOW (ref 13.0–17.0)
MCH: 35.3 pg — ABNORMAL HIGH (ref 26.0–34.0)
MCHC: 33.2 g/dL (ref 30.0–36.0)
MCV: 106.5 fL — ABNORMAL HIGH (ref 80.0–100.0)
Platelets: 403 10*3/uL — ABNORMAL HIGH (ref 150–400)
RBC: 2.15 MIL/uL — ABNORMAL LOW (ref 4.22–5.81)
RDW: 17.2 % — ABNORMAL HIGH (ref 11.5–15.5)
WBC: 11.1 10*3/uL — ABNORMAL HIGH (ref 4.0–10.5)
nRBC: 0.2 % (ref 0.0–0.2)

## 2020-07-25 LAB — GLUCOSE, CAPILLARY
Glucose-Capillary: 170 mg/dL — ABNORMAL HIGH (ref 70–99)
Glucose-Capillary: 131 mg/dL — ABNORMAL HIGH (ref 70–99)
Glucose-Capillary: 83 mg/dL (ref 70–99)

## 2020-07-25 LAB — PHOSPHORUS: Phosphorus: 4.3 mg/dL (ref 2.5–4.6)

## 2020-07-25 LAB — MAGNESIUM: Magnesium: 2.2 mg/dL (ref 1.7–2.4)

## 2020-07-25 MED ORDER — K PHOS MONO-SOD PHOS DI & MONO 155-852-130 MG PO TABS
250.0000 mg | ORAL_TABLET | Freq: Every day | ORAL | Status: DC
  Administered 2020-07-25 – 2020-07-31 (×7): 250 mg via ORAL
  Filled 2020-07-25 (×8): qty 1

## 2020-07-25 MED ORDER — CHLORDIAZEPOXIDE HCL 5 MG PO CAPS
5.0000 mg | ORAL_CAPSULE | Freq: Two times a day (BID) | ORAL | Status: AC
  Administered 2020-07-25: 5 mg via ORAL
  Filled 2020-07-25: qty 1

## 2020-07-25 MED ORDER — METOPROLOL TARTRATE 25 MG PO TABS
25.0000 mg | ORAL_TABLET | Freq: Two times a day (BID) | ORAL | Status: DC
  Administered 2020-07-25 – 2020-07-26 (×2): 25 mg via ORAL
  Filled 2020-07-25 (×2): qty 1

## 2020-07-25 NOTE — Progress Notes (Addendum)
PROGRESS NOTE        PATIENT DETAILS Name: Seth [name] Age: 64 y.o. Sex: male Date of Birth: June 23, 1956 Admit Date: 07/19/2020 Admitting Physician Shela Leff, MD EXH:[id number], Myra Rude, MD  Brief Narrative: Patient is a 64 y.o. male with history of EtOH use, CVA, gout, HTN, BPH, depression-we will presented with presyncope-found to have AKI, lactic acidosis, hypotension and admitted to the hospitalist service.  Significant events: 11/22: Admit for dehydration/AKI/hypotension.  Significant studies: 11/22>> CT head: No acute intracranial abnormality. 11/22>> chest x-ray: No acute cardiopulmonary disease. 11/23>> Echo: EF 60-65% 11/23>> renal ultrasound: No hydronephrosis, 2 small right renal cysts 11/25>> chest x-ray: Right> left basilar opacities-likely pneumonia.  Antimicrobial therapy: Unasyn: 11/25>>  Microbiology data: 11/25 >>blood culture: No growth  Procedures : None  Consults: None  DVT Prophylaxis : Place TED hose Start: 07/20/20 1522 heparin injection 5,000 Units Start: 07/20/20 0600   Subjective: No major issues overnight-lying comfortably in bed-no nausea vomiting.  Wants to get out of bed and walk.  Denies dizziness when sitting up.  Assessment/Plan: Presyncope/frequent falls: Secondary to orthostatic hypotension in the setting of dehydration: Improved-volume status appears stable-continues to have orthostatic hypotension-continue supportive care-on thigh-high Ted stockings.  PT evaluation completed-recommendations of SNF on discharge.  Await repeat orthostatic vital signs today-have discussed with RN to make sure we get orthostatic vitals today.  AKI: Hemodynamically mediated in the setting of poor oral intake/dehydration.  Improved with supportive care.  Metabolic acidosis: Likely starvation ketosis-no indication of sepsis.  Improved with supportive care.    Hypophosphatemia: Secondary to alcohol  use-repleted  Hypomagnesemia/hypokalemia: Secondary to alcohol use-repleted.  Aspiration pneumonia: Had low-grade fever/wheezing on 11/25-improved-we will plan on a total of 5 days of antimicrobial therapy.  EtOH use: Awake/alert-hardly any tremors-stop Librium today.  No longer on Ativan per CIWA protocol.  HTN: BP stable-currently off all antihypertensives given orthostatic hypotension.  History of CVA: No focal deficits-continue aspirin  GERD: Continue PPI  BPH: Due to orthostatic hypotension-Flomax stopped-on finasteride.  Claims he is urinating with no difficulty.  Amber-colored urine in Foley bag.  Anemia: Multifactorial anemia (folate/vitamin B12/iron deficiency) worsened by IV fluid dilution.  No indication for transfusion-hemoglobin slowly improving.  Continue folic acid/vitamin C78/LFYB supplementation.    COPD: Stable-no exacerbation-continue bronchodilators.  Debility/deconditioning: PT/OT eval completed-SNF on discharge.   Diet: Diet Order            Diet Carb Modified Fluid consistency: Thin; Room service appropriate? No  Diet effective now                  Code Status: Full code   Family Communication Sister-Ingrid [phone] voicemail-11/25, 11/26  Disposition Plan: Status is: Inpatient  Remains inpatient appropriate because:Inpatient level of care appropriate due to severity of illness   Dispo: The patient is from: Home              Anticipated d/c is to: SNF              Anticipated d/c date is: 2 days              Patient currently is not medically stable to d/c.  Barriers to Discharge: Aspiration pneumonia requiring IV antimicrobial therapy.  Antimicrobial agents: Anti-infectives (From admission, onward)   Start     Dose/Rate Route Frequency Ordered Stop   07/22/20 1700  Ampicillin-Sulbactam (UNASYN) 3  g in sodium chloride 0.9 % 100 mL IVPB        3 g 200 mL/hr over 30 Minutes Intravenous Every 6 hours 07/22/20 1640         Time  spent: 25 minutes-Greater than 50% of this time was spent in counseling, explanation of diagnosis, planning of further management, and coordination of care.  MEDICATIONS: Scheduled Meds: . aspirin  81 mg Oral Daily  . cyanocobalamin  1,000 mcg Intramuscular Daily  . feeding supplement  237 mL Oral TID BM  . ferrous sulfate  325 mg Oral BID WC  . finasteride  5 mg Oral Daily  . fluticasone furoate-vilanterol  1 puff Inhalation Daily  . folic acid  1 mg Oral Daily  . heparin  5,000 Units Subcutaneous Q8H  . hydrocerin   Topical BID  . multivitamin with minerals  1 tablet Oral Daily  . nicotine  14 mg Transdermal Daily  . pantoprazole  40 mg Oral Daily  . phosphorus  250 mg Oral Daily  . thiamine  100 mg Intravenous Daily  . umeclidinium bromide  1 puff Inhalation Daily   Continuous Infusions: . ampicillin-sulbactam (UNASYN) IV 3 g (07/25/20 0655)   PRN Meds:.acetaminophen, albuterol, guaiFENesin-dextromethorphan   PHYSICAL EXAM: Vital signs: Vitals:   07/25/20 0051 07/25/20 0451 07/25/20 0742 07/25/20 1146  BP: 102/65 115/69 111/72 121/70  Pulse: (!) 119 100 (!) 123 (!) 112  Resp:  16 18 20   Temp: (!) 100.7 F (38.2 C)  98.9 F (37.2 C) 99.8 F (37.7 C)  TempSrc: Oral  Oral Oral  SpO2: 100% 98% 97% 100%  Weight:      Height:       Filed Weights   07/22/20 0517 07/23/20 0426 07/24/20 0447  Weight: 68.6 kg 69.1 kg 70.8 kg   Body mass index is 20.04 kg/m.   Gen Exam:Alert awake-not in any distress HEENT:atraumatic, normocephalic Chest: B/L clear to auscultation anteriorly CVS:S1S2 regular Abdomen:soft non tender, non distended Extremities:no edema Neurology: Non focal Skin: no rash  I have personally reviewed following labs and imaging studies  LABORATORY DATA: CBC: Recent Labs  Lab 07/21/20 0206 07/22/20 0007 07/23/20 0217 07/24/20 0344 07/25/20 0218  WBC 5.8 7.9 10.7* 12.6* 11.1*  NEUTROABS 2.8 4.2 6.5 8.9*  --   HGB 8.0* 7.7* 8.0* 7.9* 7.6*  HCT  23.8* 23.5* 24.3* 24.5* 22.9*  MCV 107.7* 112.4* 110.5* 110.4* 106.5*  PLT 310 319 353 379 403*    Basic Metabolic Panel: Recent Labs  Lab 07/21/20 0206 07/22/20 0007 07/23/20 0217 07/24/20 0344 07/25/20 0218  NA 138 141 136 138 137  K 3.4* 4.0 3.7 4.0 3.9  CL 98 103 99 101 102  CO2 29 28 26 24 24   GLUCOSE 118* 103* 97 97 92  BUN 13 12 13 12 12   CREATININE 1.31* 1.08 1.16 1.14 1.10  CALCIUM 8.7* 8.6* 8.8* 8.6* 8.6*  MG 2.0 1.6* 1.9 1.8 2.2  PHOS  --   --  1.0* 3.7 4.3    GFR: Estimated Creatinine Clearance: 69.7 mL/min (by C-G formula based on SCr of 1.1 mg/dL).  Liver Function Tests: Recent Labs  Lab 07/21/20 0206 07/22/20 0007 07/23/20 0217 07/24/20 0344  AST 17 26 20 16   ALT 9 10 10 9   ALKPHOS 68 58 63 67  BILITOT 0.7 0.3 0.5 0.5  PROT 5.3* 5.2* 5.3* 5.3*  ALBUMIN 2.8* 2.6* 2.5* 2.4*   No results for input(s): LIPASE, AMYLASE in the last 168 hours. No results  for input(s): AMMONIA in the last 168 hours.  Coagulation Profile: No results for input(s): INR, PROTIME in the last 168 hours.  Cardiac Enzymes: No results for input(s): CKTOTAL, CKMB, CKMBINDEX, TROPONINI in the last 168 hours.  BNP (last 3 results) No results for input(s): PROBNP in the last 8760 hours.  Lipid Profile: No results for input(s): CHOL, HDL, LDLCALC, TRIG, CHOLHDL, LDLDIRECT in the last 72 hours.  Thyroid Function Tests: No results for input(s): TSH, T4TOTAL, FREET4, T3FREE, THYROIDAB in the last 72 hours.  Anemia Panel: No results for input(s): VITAMINB12, FOLATE, FERRITIN, TIBC, IRON, RETICCTPCT in the last 72 hours.  Urine analysis:    Component Value Date/Time   COLORURINE YELLOW 07/20/2020 0933   APPEARANCEUR HAZY (A) 07/20/2020 0933   LABSPEC 1.014 07/20/2020 0933   PHURINE 5.0 07/20/2020 0933   GLUCOSEU NEGATIVE 07/20/2020 0933   HGBUR NEGATIVE 07/20/2020 0933   BILIRUBINUR NEGATIVE 07/20/2020 0933   KETONESUR 80 (A) 07/20/2020 0933   PROTEINUR NEGATIVE  07/20/2020 0933   NITRITE NEGATIVE 07/20/2020 0933   LEUKOCYTESUR NEGATIVE 07/20/2020 0933    Sepsis Labs: Lactic Acid, Venous    Component Value Date/Time   LATICACIDVEN 1.0 07/21/2020 0206    MICROBIOLOGY: Recent Results (from the past 240 hour(s))  Respiratory Panel by RT PCR (Flu A&B, Covid) - Nasopharyngeal Swab     Status: None   Collection Time: 07/19/20 11:07 PM   Specimen: Nasopharyngeal Swab; Nasopharyngeal(NP) swabs in vial transport medium  Result Value Ref Range Status   SARS Coronavirus 2 by RT PCR NEGATIVE NEGATIVE Final    Comment: (NOTE) SARS-CoV-2 target nucleic acids are NOT DETECTED.  The SARS-CoV-2 RNA is generally detectable in upper respiratoy specimens during the acute phase of infection. The lowest concentration of SARS-CoV-2 viral copies this assay can detect is 131 copies/mL. A negative result does not preclude SARS-Cov-2 infection and should not be used as the sole basis for treatment or other patient management decisions. A negative result may occur with  improper specimen collection/handling, submission of specimen other than nasopharyngeal swab, presence of viral mutation(s) within the areas targeted by this assay, and inadequate number of viral copies (<131 copies/mL). A negative result must be combined with clinical observations, patient history, and epidemiological information. The expected result is Negative.  Fact Sheet for Patients:  PinkCheek.be  Fact Sheet for Healthcare Providers:  GravelBags.it  This test is no t yet approved or cleared by the [place] FDA and  has been authorized for detection and/or diagnosis of SARS-CoV-2 by FDA under an Emergency Use Authorization (EUA). This EUA will remain  in effect (meaning this test can be used) for the duration of the COVID-19 declaration under Section 564(b)(1) of the Act, 21 U.S.C. section 360bbb-3(b)(1), unless the  authorization is terminated or revoked sooner.     Influenza A by PCR NEGATIVE NEGATIVE Final   Influenza B by PCR NEGATIVE NEGATIVE Final    Comment: (NOTE) The Xpert Xpress SARS-CoV-2/FLU/RSV assay is intended as an aid in  the diagnosis of influenza from Nasopharyngeal swab specimens and  should not be used as a sole basis for treatment. Nasal washings and  aspirates are unacceptable for Xpert Xpress SARS-CoV-2/FLU/RSV  testing.  Fact Sheet for Patients: PinkCheek.be  Fact Sheet for Healthcare Providers: GravelBags.it  This test is not yet approved or cleared by the [place] FDA and  has been authorized for detection and/or diagnosis of SARS-CoV-2 by  FDA under an Emergency Use Authorization (EUA). This EUA will remain  in effect (meaning this test can be used) for the duration of the  Covid-19 declaration under Section 564(b)(1) of the Act, 21  U.S.C. section 360bbb-3(b)(1), unless the authorization is  terminated or revoked. Performed at [facility], [place] [address], [place], [place] [address]   Culture, blood (routine x 2)     Status: None (Preliminary result)   Collection Time: 07/22/20  4:32 PM   Specimen: BLOOD  Result Value Ref Range Status   Specimen Description BLOOD RIGHT ANTECUBITAL  Final   Special Requests   Final    BOTTLES DRAWN AEROBIC AND ANAEROBIC Blood Culture adequate volume   Culture   Final    NO GROWTH 3 DAYS Performed at [facility], [place] [address], [place], [place] [address]    Report Status PENDING  Incomplete  Culture, blood (routine x 2)     Status: None (Preliminary result)   Collection Time: 07/22/20  4:39 PM   Specimen: BLOOD LEFT ARM  Result Value Ref Range Status   Specimen Description BLOOD LEFT ARM  Final   Special Requests   Final    BOTTLES DRAWN AEROBIC AND ANAEROBIC Blood Culture adequate volume   Culture   Final    NO GROWTH 3 DAYS Performed at [facility], [place] [address], [place], [place] [address]    Report Status PENDING  Incomplete    RADIOLOGY STUDIES/RESULTS: No results found.   LOS: 6 days   Oren Binet, MD  [facility]    To contact the attending provider between 7A-7P or the covering provider during after hours 7P-7A, please log into the web site [email or web address] and access using universal [place] password for that web site. If you do not have the password, please call the hospital operator.  07/25/2020, 12:55 PM

## 2020-07-25 NOTE — Progress Notes (Signed)
Pharmacy Antibiotic Note  Seth [name] is a 64 y.o. male admitted on 07/19/2020 with suspicion for aspiration pneumonia.  Pharmacy was consulted for Unasyn dosing, now on day 3. Blood cultures from 11/25 shows no growth. WBC 11.1, afebrile.  Plan: Continue Unasyn 3g IV every 6 hours for planned 5 day duration  Dose appropriate for renal function and indication.  Height: 6\' 2"  (188 cm) Weight: 70.8 kg (156 lb 1.4 oz) IBW/kg (Calculated) : 82.2  Temp (24hrs), Avg:99.7 F (37.6 C), Min:98.9 F (37.2 C), Max:100.7 F (38.2 C)  Recent Labs  Lab  0000 07/19/20 2244 07/20/20 0032 07/20/20 0758 07/21/20 0206 07/22/20 0007 07/23/20 0217 07/24/20 0344 07/25/20 0218  WBC   < >  --   --   --  5.8 7.9 10.7* 12.6* 11.1*  CREATININE  --   --   --    < > 1.31* 1.08 1.16 1.14 1.10  LATICACIDVEN  --  2.2* 2.1*  --  1.0  --   --   --   --    < > = values in this interval not displayed.    Estimated Creatinine Clearance: 69.7 mL/min (by C-G formula based on SCr of 1.1 mg/dL).    No Known Allergies  Thank you for allowing pharmacy to be a part of this patient's care.  Fara Olden, PharmD PGY-1 Pharmacy Resident 07/25/2020 9:56 AM Please see AMION for all pharmacy numbers

## 2020-07-25 NOTE — Plan of Care (Signed)
  Problem: Activity: Goal: Risk for activity intolerance will decrease Outcome: Progressing   Problem: Coping: Goal: Level of anxiety will decrease Outcome: Progressing   

## 2020-07-25 NOTE — Progress Notes (Signed)
   07/24/20 2050  Assess: MEWS Score  Temp 100 F (37.8 C)  BP 132/81  Pulse Rate (!) 116  ECG Heart Rate (!) 117  SpO2 100 %  O2 Device Room Air  Assess: MEWS Score  MEWS Temp 0  MEWS Systolic 0  MEWS Pulse 2  MEWS RR 0  MEWS LOC 0  MEWS Score 2  MEWS Score Color Yellow  Assess: if the MEWS score is Yellow or Red  Were vital signs taken at a resting state? Yes  Focused Assessment No change from prior assessment  Early Detection of Sepsis Score *See Row Information* High  MEWS guidelines implemented *See Row Information* Yes  Take Vital Signs  Increase Vital Sign Frequency  Yellow: Q 2hr X 2 then Q 4hr X 2, if remains yellow, continue Q 4hrs  Escalate  MEWS: Escalate Yellow: discuss with charge nurse/RN and consider discussing with provider and RRT  Notify: Charge Nurse/RN  Name of Charge Nurse/RN Notified Eula Mazzola   Date Charge Nurse/RN Notified 07/24/20  Time Charge Nurse/RN Notified 2050  Document  Patient Outcome Other (Comment) (pt stable and remains on unit)  Progress note created (see row info) Yes

## 2020-07-26 DIAGNOSIS — R296 Repeated falls: Secondary | ICD-10-CM | POA: Diagnosis not present

## 2020-07-26 DIAGNOSIS — F1020 Alcohol dependence, uncomplicated: Secondary | ICD-10-CM | POA: Diagnosis not present

## 2020-07-26 DIAGNOSIS — N179 Acute kidney failure, unspecified: Secondary | ICD-10-CM | POA: Diagnosis not present

## 2020-07-26 DIAGNOSIS — E872 Acidosis: Secondary | ICD-10-CM | POA: Diagnosis not present

## 2020-07-26 LAB — GLUCOSE, CAPILLARY
Glucose-Capillary: 82 mg/dL (ref 70–99)
Glucose-Capillary: 85 mg/dL (ref 70–99)
Glucose-Capillary: 126 mg/dL — ABNORMAL HIGH (ref 70–99)
Glucose-Capillary: 97 mg/dL (ref 70–99)
Glucose-Capillary: 125 mg/dL — ABNORMAL HIGH (ref 70–99)

## 2020-07-26 LAB — BASIC METABOLIC PANEL
Anion gap: 12 (ref 5–15)
BUN: 13 mg/dL (ref 8–23)
CO2: 22 mmol/L (ref 22–32)
Calcium: 8.7 mg/dL — ABNORMAL LOW (ref 8.9–10.3)
Chloride: 104 mmol/L (ref 98–111)
Creatinine, Ser: 1.17 mg/dL (ref 0.61–1.24)
GFR, Estimated: >60 mL/min (ref >60)
Glucose, Bld: 80 mg/dL (ref 70–99)
Potassium: 3.8 mmol/L (ref 3.5–5.1)
Sodium: 138 mmol/L (ref 135–145)

## 2020-07-26 LAB — PHOSPHORUS: Phosphorus: 4.4 mg/dL (ref 2.5–4.6)

## 2020-07-26 MED ORDER — LACTATED RINGERS IV SOLN: INTRAVENOUS | Status: AC

## 2020-07-26 MED ORDER — METOPROLOL TARTRATE 12.5 MG HALF TABLET
12.5000 mg | ORAL_TABLET | Freq: Two times a day (BID) | ORAL | Status: DC
  Administered 2020-07-26 – 2020-07-31 (×10): 12.5 mg via ORAL
  Filled 2020-07-26 (×10): qty 1

## 2020-07-26 MED ORDER — POTASSIUM CHLORIDE CRYS ER 20 MEQ PO TBCR
40.0000 meq | EXTENDED_RELEASE_TABLET | Freq: Once | ORAL | Status: AC
  Administered 2020-07-26: 40 meq via ORAL
  Filled 2020-07-26: qty 2

## 2020-07-26 NOTE — Progress Notes (Signed)
PROGRESS NOTE        PATIENT DETAILS Name: Seth [name] Age: 64 y.o. Sex: male Date of Birth: Mar 05, 1956 Admit Date: 07/19/2020 Admitting Physician Shela Leff, MD GGE:ZMOQH, Myra Rude, MD  Brief Narrative: Patient is a 64 y.o. male with history of EtOH use, CVA, gout, HTN, BPH, depression-we will presented with presyncope-found to have AKI, lactic acidosis, hypotension and admitted to the hospitalist service.  Significant events: 11/22: Admit for dehydration/AKI/hypotension.  Significant studies: 11/22>> CT head: No acute intracranial abnormality. 11/22>> chest x-ray: No acute cardiopulmonary disease. 11/23>> Echo: EF 60-65% 11/23>> renal ultrasound: No hydronephrosis, 2 small right renal cysts 11/25>> chest x-ray: Right> left basilar opacities-likely pneumonia.  Antimicrobial therapy: Unasyn: 11/25>>  Microbiology data: 11/25 >>blood culture: No growth  Procedures : None  Consults: None  DVT Prophylaxis : Place TED hose Start: 07/20/20 1522 heparin injection 5,000 Units Start: 07/20/20 0600   Subjective: Feels better-no shortness of breath-still with orthostatic hypotension as of yesterday.  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  Resting blood pressure high-but still orthostatic.  Per PT-profoundly weak.  Continue thigh-high TED stockings-gently hydrate for a few hours-decrease metoprolol to 12.5 mg twice daily.  Repeat orthostatic vital signs today.  AKI: Hemodynamically mediated in the setting of poor oral intake/dehydration.  Improved with supportive care.  Metabolic acidosis: Likely starvation ketosis-no indication of sepsis.  Improved with supportive care.    Hypophosphatemia: Secondary to alcohol use-repleted  Hypomagnesemia/hypokalemia: Secondary to alcohol use-repleted.  Aspiration pneumonia: Had low-grade  fever/wheezing on 11/25-improved-we will plan on a total of 5 days of antimicrobial therapy.  EtOH use: Awake/alert-alcohol withdrawal symptoms have resolved-no longer on any benzodiazepines.   HTN: Resting blood pressure elevated-but still orthostatic.  Plan to decrease metoprolol to 12.5 mg twice daily.  Tolerate some amount of permissive hypertension at rest.  History of CVA: No focal deficits-continue aspirin  GERD: Continue PPI  BPH: Due to orthostatic hypotension-Flomax stopped-on finasteride.  Claims he is urinating with no difficulty.  Amber-colored urine in Foley bag.  Anemia: Multifactorial anemia (folate/vitamin B12/iron deficiency) worsened by IV fluid dilution.  No indication for transfusion-hemoglobin slowly improving.  Continue folic acid/vitamin U76/LYYT supplementation.    COPD: Stable-no exacerbation-continue bronchodilators.  Debility/deconditioning: PT/OT eval completed-SNF on discharge.   Diet: Diet Order            Diet Carb Modified Fluid consistency: Thin; Room service appropriate? No  Diet effective now                  Code Status: Full code   Family Communication Sister-Ingrid [phone] voicemail-11/25, 11/26  Disposition Plan: Status is: Inpatient  Remains inpatient appropriate because:Inpatient level of care appropriate due to severity of illness   Dispo: The patient is from: Home              Anticipated d/c is to: SNF              Anticipated d/c date is: 2 days              Patient currently is not medically stable to d/c.  Barriers to Discharge: Aspiration pneumonia requiring IV antimicrobial therapy.  Antimicrobial agents: Anti-infectives (From admission, onward)   Start     Dose/Rate Route Frequency Ordered Stop   07/22/20 1700  Ampicillin-Sulbactam (UNASYN) 3  g in sodium chloride 0.9 % 100 mL IVPB        3 g 200 mL/hr over 30 Minutes Intravenous Every 6 hours 07/22/20 1640         Time spent: 25 minutes-Greater  than 50% of this time was spent in counseling, explanation of diagnosis, planning of further management, and coordination of care.  MEDICATIONS: Scheduled Meds: . aspirin  81 mg Oral Daily  . cyanocobalamin  1,000 mcg Intramuscular Daily  . feeding supplement  237 mL Oral TID BM  . ferrous sulfate  325 mg Oral BID WC  . finasteride  5 mg Oral Daily  . fluticasone furoate-vilanterol  1 puff Inhalation Daily  . folic acid  1 mg Oral Daily  . heparin  5,000 Units Subcutaneous Q8H  . hydrocerin   Topical BID  . metoprolol tartrate  25 mg Oral BID  . multivitamin with minerals  1 tablet Oral Daily  . nicotine  14 mg Transdermal Daily  . pantoprazole  40 mg Oral Daily  . phosphorus  250 mg Oral Daily  . thiamine  100 mg Intravenous Daily  . umeclidinium bromide  1 puff Inhalation Daily   Continuous Infusions: . ampicillin-sulbactam (UNASYN) IV 3 g (07/26/20 0526)  . lactated ringers 100 mL/hr at 07/26/20 0849   PRN Meds:.acetaminophen, albuterol, guaiFENesin-dextromethorphan   PHYSICAL EXAM: Vital signs: Vitals:   07/25/20 2200 07/26/20 0030 07/26/20 0544 07/26/20 0719  BP: 133/79 (!) 152/92 (!) 151/81 132/73  Pulse:  100 100 94  Resp:  18 19 20   Temp:  99.4 F (37.4 C) (!) 100.7 F (38.2 C) 100.2 F (37.9 C)  TempSrc:  Oral Oral Oral  SpO2:  100%  94%  Weight:   70.1 kg   Height:       Filed Weights   07/23/20 0426 07/24/20 0447 07/26/20 0544  Weight: 69.1 kg 70.8 kg 70.1 kg   Body mass index is 19.84 kg/m.   Gen Exam:Alert awake-not in any distress HEENT:atraumatic, normocephalic Chest: B/L clear to auscultation anteriorly CVS:S1S2 regular Abdomen:soft non tender, non distended Extremities:no edema Neurology: Non focal Skin: no rash  I have personally reviewed following labs and imaging studies  LABORATORY DATA: CBC: Recent Labs  Lab 07/21/20 0206 07/22/20 0007 07/23/20 0217 07/24/20 0344 07/25/20 0218  WBC 5.8 7.9 10.7* 12.6* 11.1*  NEUTROABS 2.8  4.2 6.5 8.9*  --   HGB 8.0* 7.7* 8.0* 7.9* 7.6*  HCT 23.8* 23.5* 24.3* 24.5* 22.9*  MCV 107.7* 112.4* 110.5* 110.4* 106.5*  PLT 310 319 353 379 403*    Basic Metabolic Panel: Recent Labs  Lab 07/21/20 0206 07/21/20 0206 07/22/20 0007 07/23/20 0217 07/24/20 0344 07/25/20 0218 07/26/20 0501  NA 138   < > 141 136 138 137 138  K 3.4*   < > 4.0 3.7 4.0 3.9 3.8  CL 98   < > 103 99 101 102 104  CO2 29   < > 28 26 24 24 22   GLUCOSE 118*   < > 103* 97 97 92 80  BUN 13   < > 12 13 12 12 13   CREATININE 1.31*   < > 1.08 1.16 1.14 1.10 1.17  CALCIUM 8.7*   < > 8.6* 8.8* 8.6* 8.6* 8.7*  MG 2.0  --  1.6* 1.9 1.8 2.2  --   PHOS  --   --   --  1.0* 3.7 4.3 4.4   < > = values in this interval not displayed.  GFR: Estimated Creatinine Clearance: 64.9 mL/min (by C-G formula based on SCr of 1.17 mg/dL).  Liver Function Tests: Recent Labs  Lab 07/21/20 0206 07/22/20 0007 07/23/20 0217 07/24/20 0344  AST 17 26 20 16   ALT 9 10 10 9   ALKPHOS 68 58 63 67  BILITOT 0.7 0.3 0.5 0.5  PROT 5.3* 5.2* 5.3* 5.3*  ALBUMIN 2.8* 2.6* 2.5* 2.4*   No results for input(s): LIPASE, AMYLASE in the last 168 hours. No results for input(s): AMMONIA in the last 168 hours.  Coagulation Profile: No results for input(s): INR, PROTIME in the last 168 hours.  Cardiac Enzymes: No results for input(s): CKTOTAL, CKMB, CKMBINDEX, TROPONINI in the last 168 hours.  BNP (last 3 results) No results for input(s): PROBNP in the last 8760 hours.  Lipid Profile: No results for input(s): CHOL, HDL, LDLCALC, TRIG, CHOLHDL, LDLDIRECT in the last 72 hours.  Thyroid Function Tests: No results for input(s): TSH, T4TOTAL, FREET4, T3FREE, THYROIDAB in the last 72 hours.  Anemia Panel: No results for input(s): VITAMINB12, FOLATE, FERRITIN, TIBC, IRON, RETICCTPCT in the last 72 hours.  Urine analysis:    Component Value Date/Time   COLORURINE YELLOW 07/20/2020 0933   APPEARANCEUR HAZY (A) 07/20/2020 0933   LABSPEC  1.014 07/20/2020 0933   PHURINE 5.0 07/20/2020 0933   GLUCOSEU NEGATIVE 07/20/2020 0933   HGBUR NEGATIVE 07/20/2020 0933   BILIRUBINUR NEGATIVE 07/20/2020 0933   KETONESUR 80 (A) 07/20/2020 0933   PROTEINUR NEGATIVE 07/20/2020 0933   NITRITE NEGATIVE 07/20/2020 0933   LEUKOCYTESUR NEGATIVE 07/20/2020 0933    Sepsis Labs: Lactic Acid, Venous    Component Value Date/Time   LATICACIDVEN 1.0 07/21/2020 0206    MICROBIOLOGY: Recent Results (from the past 240 hour(s))  Respiratory Panel by RT PCR (Flu A&B, Covid) - Nasopharyngeal Swab     Status: None   Collection Time: 07/19/20 11:07 PM   Specimen: Nasopharyngeal Swab; Nasopharyngeal(NP) swabs in vial transport medium  Result Value Ref Range Status   SARS Coronavirus 2 by RT PCR NEGATIVE NEGATIVE Final    Comment: (NOTE) SARS-CoV-2 target nucleic acids are NOT DETECTED.  The SARS-CoV-2 RNA is generally detectable in upper respiratoy specimens during the acute phase of infection. The lowest concentration of SARS-CoV-2 viral copies this assay can detect is 131 copies/mL. A negative result does not preclude SARS-Cov-2 infection and should not be used as the sole basis for treatment or other patient management decisions. A negative result may occur with  improper specimen collection/handling, submission of specimen other than nasopharyngeal swab, presence of viral mutation(s) within the areas targeted by this assay, and inadequate number of viral copies (<131 copies/mL). A negative result must be combined with clinical observations, patient history, and epidemiological information. The expected result is Negative.  Fact Sheet for Patients:  PinkCheek.be  Fact Sheet for Healthcare Providers:  GravelBags.it  This test is no t yet approved or cleared by the [place] FDA and  has been authorized for detection and/or diagnosis of SARS-CoV-2 by FDA under an Emergency Use  Authorization (EUA). This EUA will remain  in effect (meaning this test can be used) for the duration of the COVID-19 declaration under Section 564(b)(1) of the Act, 21 U.S.C. section 360bbb-3(b)(1), unless the authorization is terminated or revoked sooner.     Influenza A by PCR NEGATIVE NEGATIVE Final   Influenza B by PCR NEGATIVE NEGATIVE Final    Comment: (NOTE) The Xpert Xpress SARS-CoV-2/FLU/RSV assay is intended as an aid in  the diagnosis  of influenza from Nasopharyngeal swab specimens and  should not be used as a sole basis for treatment. Nasal washings and  aspirates are unacceptable for Xpert Xpress SARS-CoV-2/FLU/RSV  testing.  Fact Sheet for Patients: PinkCheek.be  Fact Sheet for Healthcare Providers: GravelBags.it  This test is not yet approved or cleared by the [place] FDA and  has been authorized for detection and/or diagnosis of SARS-CoV-2 by  FDA under an Emergency Use Authorization (EUA). This EUA will remain  in effect (meaning this test can be used) for the duration of the  Covid-19 declaration under Section 564(b)(1) of the Act, 21  U.S.C. section 360bbb-3(b)(1), unless the authorization is  terminated or revoked. Performed at [facility], [place] [address], [place], [place] [address]   Culture, blood (routine x 2)     Status: None (Preliminary result)   Collection Time: 07/22/20  4:32 PM   Specimen: BLOOD  Result Value Ref Range Status   Specimen Description BLOOD RIGHT ANTECUBITAL  Final   Special Requests   Final    BOTTLES DRAWN AEROBIC AND ANAEROBIC Blood Culture adequate volume   Culture   Final    NO GROWTH 3 DAYS Performed at [facility], [place] [address], [place], [place] [address]    Report Status PENDING  Incomplete  Culture, blood (routine x 2)     Status: None (Preliminary result)   Collection Time: 07/22/20  4:39 PM   Specimen: BLOOD LEFT ARM  Result Value Ref  Range Status   Specimen Description BLOOD LEFT ARM  Final   Special Requests   Final    BOTTLES DRAWN AEROBIC AND ANAEROBIC Blood Culture adequate volume   Culture   Final    NO GROWTH 3 DAYS Performed at [facility], [place] [address], [place], [place] [address]    Report Status PENDING  Incomplete    RADIOLOGY STUDIES/RESULTS: No results found.   LOS: 7 days   Oren Binet, MD  [facility]    To contact the attending provider between 7A-7P or the covering provider during after hours 7P-7A, please log into the web site [email or web address] and access using universal [place] password for that web site. If you do not have the password, please call the hospital operator.  07/26/2020, 9:10 AM

## 2020-07-26 NOTE — TOC Progression Note (Addendum)
Transition of Care [facility]) - Progression Note    Patient Details  Name: AMAZIAH RAISANEN MRN: [id number] Date of Birth: Jan 29, 1956  Transition of Care [facility]) CM/SW [place], [place] Phone Number: 07/26/2020, 4:38 PM  Clinical Narrative:     Update 11/29- 5:42pm- CSW  Received consult for substance use/educational resources. CSW offered patient Substance Use Treatment services resources. Patient accepted.  CSW spoke with patient at bedside and explained that there are no current bed offers. CSW discussed with patient his barriers to SNF placement. CSW let patient know we can try to fax out to another area but reminded patient of barriers to placement and that there is a possibility that there may be no bed offers in [place] area. Patient understood and agreed to try and fax out initial referral to [place] area. CSW will follow up with patient in the morning to see if there are any SNF bed offers in [place] area.   No Bed offers.   CSW will continue to follow.    Expected Discharge Plan: [place] Barriers to Discharge: Continued Medical Work up  Expected Discharge Plan and Services Expected Discharge Plan: [place] arrangements for the past 2 months: Single Family Home                                       Social Determinants of Health (SDOH) Interventions    Readmission Risk Interventions Readmission Risk Prevention Plan 06/04/2019  Transportation Screening Complete  PCP or Specialist Appt within 5-7 Days Complete  Home Care Screening Complete  Medication Review (RN CM) Complete  Some recent data might be hidden

## 2020-07-26 NOTE — Care Management Important Message (Signed)
Important Message  Patient Details  Name: Seth [name] MRN: [id number] Date of Birth: 11/12/1955   Medicare Important Message Given:  Yes     [name] 07/26/2020, 11:51 AM

## 2020-07-27 DIAGNOSIS — E872 Acidosis: Secondary | ICD-10-CM | POA: Diagnosis not present

## 2020-07-27 DIAGNOSIS — R296 Repeated falls: Secondary | ICD-10-CM | POA: Diagnosis not present

## 2020-07-27 DIAGNOSIS — N179 Acute kidney failure, unspecified: Secondary | ICD-10-CM | POA: Diagnosis not present

## 2020-07-27 DIAGNOSIS — F1020 Alcohol dependence, uncomplicated: Secondary | ICD-10-CM | POA: Diagnosis not present

## 2020-07-27 LAB — CULTURE, BLOOD (ROUTINE X 2)
Culture: NO GROWTH
Culture: NO GROWTH
Special Requests: ADEQUATE
Special Requests: ADEQUATE

## 2020-07-27 LAB — GLUCOSE, CAPILLARY
Glucose-Capillary: 81 mg/dL (ref 70–99)
Glucose-Capillary: 92 mg/dL (ref 70–99)
Glucose-Capillary: 85 mg/dL (ref 70–99)
Glucose-Capillary: 104 mg/dL — ABNORMAL HIGH (ref 70–99)

## 2020-07-27 MED ORDER — CYANOCOBALAMIN 1000 MCG/ML IJ SOLN
1000.0000 ug | INTRAMUSCULAR | Status: DC
  Administered 2020-07-27: 1000 ug via INTRAMUSCULAR
  Filled 2020-07-27: qty 1

## 2020-07-27 MED ORDER — LACTATED RINGERS IV SOLN: INTRAVENOUS | Status: AC

## 2020-07-27 NOTE — TOC Progression Note (Addendum)
Transition of Care [facility]) - Progression Note    Patient Details  Name: DEVUN ANNA MRN: [id number] Date of Birth: [birth year]  Transition of Care [facility]) CM/SW [place], [place] Phone Number: 07/27/2020, 1:19 PM  Clinical Narrative:     Update 11/30 4:30pm- CSW called and left voicemail for patients sister Tito Dine. Awaiting callback to see if any family will be able to provide 24/7 supervision at home for patient.  CSW will continue to follow.  CSW spoke with patient at bedside. CSW informed patient that there is no bed offers for SNF placement. CSW discussed barriers and discussed alternate plan for patient.Patient is interested in [facility]. CSW informed case Freight forwarder.  CSW will continue to follow.    Expected Discharge Plan: [place] Barriers to Discharge: Continued Medical Work up  Expected Discharge Plan and Services Expected Discharge Plan: [place] arrangements for the past 2 months: Single Family Home                                       Social Determinants of Health (SDOH) Interventions    Readmission Risk Interventions Readmission Risk Prevention Plan 06/04/2019  Transportation Screening Complete  PCP or Specialist Appt within 5-7 Days Complete  Home Care Screening Complete  Medication Review (RN CM) Complete  Some recent data might be hidden

## 2020-07-27 NOTE — Plan of Care (Signed)

## 2020-07-27 NOTE — Progress Notes (Addendum)
Physical Therapy Treatment Patient Details Name: Seth [name] MRN: [id number] DOB: 02-28-1956 Today's Date: 07/27/2020    History of Present Illness 64 yo male with onset of dizziness and SOB with HA's was admitted.  Found AKI, orthostatic BP's, low BS and low K+.  PMHx:  c-spine fusion, gout, HTN, EtOH abuse, COPD, B cerebellar strokes, atherosclerosisl    PT Comments    Pt fully participated in session. Pt continues to progress with standing tolerance and improved BP, but is limited by overall strength, balance and coordination. Pt able to scoot up sitting EOB with S demonstrating improved strength and sitting balance. Pt continues to demonstrate deficits in balance, strength, coordination, endurance, gait and safety and will benefit from skilled PT to address deficits to maximize independence with functional mobility prior to discharge.     Follow Up Recommendations  SNF     Equipment Recommendations  Wheelchair (measurements PT);Wheelchair cushion (measurements PT);Rolling Stankovich with 5" wheels    Recommendations for Other Services       Precautions / Restrictions Precautions Precautions: Fall Precaution Comments: watch BP Restrictions Weight Bearing Restrictions: No    Mobility  Bed Mobility Overal bed mobility: Needs Assistance Bed Mobility: Supine to Sit;Sit to Supine     Supine to sit: Min assist;HOB elevated Sit to supine: Min assist   General bed mobility comments: with increased time and verbal cueing pt able to increase participation in bed mobility, pt requiring min A for trunk control  Transfers Overall transfer level: Needs assistance Equipment used: 2 person hand held assist Transfers: Sit to/from Stand Sit to Stand: +2 physical assistance;Mod assist;From elevated surface         General transfer comment: performed 5 sit<>stand transfers with therapist and NT providing support at knee to prevent buckling and mod A at trunk to pull up to standing,  pt with hyperextension at knees locking out with trunk forward flexed; performed scooting up in bed while sitting EOB, verbal cueing for sequencing S for safety  Ambulation/Gait                 Stairs             Wheelchair Mobility    Modified Rankin (Stroke Patients Only)       Balance                                            Cognition                                              Exercises  performed 10 bridges with therapist providing support at feet, decreased clearance noted    General Comments        Pertinent Vitals/Pain Pain Assessment: No/denies pain    Home Living                      Prior Function            PT Goals (current goals can now be found in the care plan section) Acute Rehab PT Goals Patient Stated Goal: to feel better PT Goal Formulation: With patient Time For Goal Achievement: 08/06/20 Potential to Achieve Goals: Fair Progress towards PT goals: Progressing toward goals    Frequency  Min 2X/week      PT Plan Current plan remains appropriate;Frequency needs to be updated    Co-evaluation              AM-PAC PT "6 Clicks" Mobility   Outcome Measure  Help needed turning from your back to your side while in a flat bed without using bedrails?: A Lot Help needed moving from lying on your back to sitting on the side of a flat bed without using bedrails?: A Lot Help needed moving to and from a bed to a chair (including a wheelchair)?: Total Help needed standing up from a chair using your arms (e.g., wheelchair or bedside chair)?: A Lot Help needed to walk in hospital room?: Total Help needed climbing 3-5 steps with a railing? : Total 6 Click Score: 9    End of Session Equipment Utilized During Treatment: Gait belt Activity Tolerance: Patient tolerated treatment well Patient left: in bed;with call bell/phone within reach;with bed alarm set Nurse Communication:  Mobility status;Need for lift equipment PT Visit Diagnosis: Unsteadiness on feet (R26.81);Muscle weakness (generalized) (M62.81);Difficulty in walking, not elsewhere classified (R26.2)     Time: 2563-8937 PT Time Calculation (min) (ACUTE ONLY): 16 min  Charges:  $Therapeutic Exercise: 8-22 mins                     Lyanne Co, DPT Acute Rehabilitation Services [phone]   [place] 07/27/2020, 1:28 PM

## 2020-07-27 NOTE — Progress Notes (Signed)
Occupational Therapy Treatment Patient Details Name: Seth [name] MRN: [id number] DOB: 12-Apr-1956 Today's Date: 07/27/2020    History of present illness 64 yo male with onset of dizziness and SOB with HA's was admitted.  Found AKI, orthostatic BP's, low BS and low K+.  PMHx:  c-spine fusion, gout, HTN, EtOH abuse, COPD, B cerebellar strokes, atherosclerosisl   OT comments  Pt limited by decreased strength, poor activity tolerance, dizziness and decreased ability to properly care for self. Pt BP continues to drop with sitting and standing 111/67 BP in supine. Shoulder flex, shoulder rows, elbow flex/ext x10 reps, 2 sets with pursed lip breathing. OOB activity avoided as pt continues to be orthostatic. Exercises performed in long sitting and left in chair position. Pt would benefit from continued OT skilled services. OT following acutely.   Follow Up Recommendations  SNF;Supervision/Assistance - 24 hour    Equipment Recommendations  3 in 1 bedside commode;Wheelchair (measurements OT);Wheelchair cushion (measurements OT)    Recommendations for Other Services      Precautions / Restrictions Precautions Precautions: Fall Precaution Comments: watch BP Restrictions Weight Bearing Restrictions: No       Mobility Bed Mobility Overal bed mobility: Needs Assistance Bed Mobility: Supine to Sit;Sit to Supine     Supine to sit: Min assist;HOB elevated Sit to supine: Min assist   General bed mobility comments: Pt long sitting for HEP  Transfers Overall transfer level: Needs assistance Equipment used: 2 person hand held assist Transfers: Sit to/from Stand Sit to Stand: +2 physical assistance;Mod assist;From elevated surface         General transfer comment: RN had just gotten pt up +2 assist for orthostatic vitals    Balance Overall balance assessment: Needs assistance                                         ADL either performed or assessed with clinical  judgement   ADL Overall ADL's : Needs assistance/impaired Eating/Feeding: Set up;Bed level Eating/Feeding Details (indicate cue type and reason): Pt too fatigued and dizzy to sit at EOB or transfer to recliner for task                                 Functional mobility during ADLs: +2 for physical assistance;+2 for safety/equipment (now requiring stedy per PT note and RN +2 maxA today) General ADL Comments: Pt limited by decreased strength, poor activity tolerance, dizziness and decreased ability to properly care for self.     Vision   Vision Assessment?: No apparent visual deficits   Perception     Praxis      Cognition Arousal/Alertness: Awake/alert Behavior During Therapy: Flat affect Overall Cognitive Status: Impaired/Different from baseline Area of Impairment: Safety/judgement;Problem solving                         Safety/Judgement: Decreased awareness of safety;Decreased awareness of deficits   Problem Solving: Slow processing;Requires verbal cues;Requires tactile cues          Exercises Exercises: Other exercises Other Exercises Other Exercises: Shoulder flex, shoulder rows, elbow flex/ext x10 reps, 2 sets with pursed lip breathing   Shoulder Instructions       General Comments Pt BP continues to drop with sitting and standing 111/67 BP on supine.    Pertinent  Vitals/ Pain       Pain Assessment: No/denies pain  Home Living                                          Prior Functioning/Environment              Frequency  Min 2X/week        Progress Toward Goals  OT Goals(current goals can now be found in the care plan section)  Progress towards OT goals: Progressing toward goals  Acute Rehab OT Goals Patient Stated Goal: to feel better OT Goal Formulation: With patient Time For Goal Achievement: 08/04/20 Potential to Achieve Goals: Good ADL Goals Pt Will Perform Grooming: with modified  independence;standing Pt Will Perform Lower Body Dressing: with supervision;sitting/lateral leans;sit to/from stand Pt Will Transfer to Toilet: with supervision;ambulating;regular height toilet Pt Will Perform Toileting - Clothing Manipulation and hygiene: with supervision;sitting/lateral leans;sit to/from stand Pt/caregiver will Perform Home Exercise Program: Increased strength;Both right and left upper extremity Additional ADL Goal #1: Pt will tolerate x10 mins of ADL and mobility with supervisionA overall in order to increase endurance for OOB ADL.  Plan Discharge plan needs to be updated    Co-evaluation                 AM-PAC OT "6 Clicks" Daily Activity     Outcome Measure   Help from another person eating meals?: None Help from another person taking care of personal grooming?: A Little Help from another person toileting, which includes using toliet, bedpan, or urinal?: A Little Help from another person bathing (including washing, rinsing, drying)?: A Lot Help from another person to put on and taking off regular upper body clothing?: A Little Help from another person to put on and taking off regular lower body clothing?: A Lot 6 Click Score: 17    End of Session    OT Visit Diagnosis: Unsteadiness on feet (R26.81);Muscle weakness (generalized) (M62.81)   Activity Tolerance Patient tolerated treatment well   Patient Left in bed;with call bell/phone within reach;with bed alarm set   Nurse Communication Mobility status        Time: 7096-2836 OT Time Calculation (min): 27 min  Charges: OT General Charges $OT Visit: 1 Visit OT Treatments $Therapeutic Activity: 8-22 mins $Therapeutic Exercise: 8-22 mins  Jefferey Pica, OTR/L Acute Rehabilitation Services Pager: [phone] Office: [phone]    Richa Shor C 07/27/2020, 3:06 PM

## 2020-07-27 NOTE — Progress Notes (Signed)
PROGRESS NOTE        PATIENT DETAILS Name: Seth [name] Age: 64 y.o. Sex: male Date of Birth: June 02, 1956 Admit Date: 07/19/2020 Admitting Physician Shela Leff, MD LOV:FIEPP, Myra Rude, MD  Brief Narrative: Patient is a 64 y.o. male with history of EtOH use, CVA, gout, HTN, BPH, depression-we will presented with presyncope-found to have AKI, lactic acidosis, hypotension and admitted to the hospitalist service.  Significant events: 11/22: Admit for dehydration/AKI/hypotension.  Significant studies: 11/22>> CT head: No acute intracranial abnormality. 11/22>> chest x-ray: No acute cardiopulmonary disease. 11/23>> Echo: EF 60-65% 11/23>> renal ultrasound: No hydronephrosis, 2 small right renal cysts 11/25>> chest x-ray: Right> left basilar opacities-likely pneumonia.  Antimicrobial therapy: Unasyn: 11/25>>11/30  Microbiology data: 11/25 >>blood culture: No growth  Procedures : None  Consults: None  DVT Prophylaxis : Place TED hose Start: 07/20/20 1522 heparin injection 5,000 Units Start: 07/20/20 0600   Subjective: No major issues overnight.  Orthostatic vital signs is positive.  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  Resting blood pressure high-but still orthostatic.  Per PT-profoundly weak.  Continue thigh-high TED stockings-g we will again hydrate for a few hours today-cautiously continue with beta-blockers.  No longer on Flomax.  Awaiting SNF bed.    AKI: Hemodynamically mediated in the setting of poor oral intake/dehydration.  Improved with supportive care.  Metabolic acidosis: Likely starvation ketosis-no indication of sepsis.  Improved with supportive care.    Hypophosphatemia: Secondary to alcohol use-repleted  Hypomagnesemia/hypokalemia: Secondary to alcohol use-repleted.  Aspiration pneumonia: Had low-grade  fever/wheezing on 11/25-clinically improved-last day of antimicrobial therapy on 11/30.   EtOH use: Awake/alert-alcohol withdrawal symptoms have resolved-no longer on any benzodiazepines.   HTN: Resting blood pressure stable-on low-dose metoprolol.  Continues to have some amount of orthostatic hypotension as well.  Difficult situation-really no good options-continue to follow closely.    History of CVA: No focal deficits-continue aspirin  GERD: Continue PPI  BPH: Due to orthostatic hypotension-Flomax stopped-on finasteride.  Claims he is urinating with no difficulty.  Amber-colored urine in Foley bag.  Anemia: Multifactorial anemia (folate/vitamin B12/iron deficiency) worsened by IV fluid dilution.  No indication for transfusion-hemoglobin slowly improving.  Continue folic acid/vitamin I95/JOAC supplementation.    COPD: Stable-no exacerbation-continue bronchodilators.  Debility/deconditioning: PT/OT eval completed-SNF on discharge.  Per PT note-unable to ambulate-social worker following for safe disposition-having difficulties with SNF placement.   Diet: Diet Order            Diet Carb Modified Fluid consistency: Thin; Room service appropriate? No  Diet effective now                  Code Status: Full code   Family Communication Sister-Ingrid [phone] voicemail-11/25, 11/26  Disposition Plan: Status is: Inpatient  Remains inpatient appropriate because:Inpatient level of care appropriate due to severity of illness   Dispo: The patient is from: Home              Anticipated d/c is to: SNF              Anticipated d/c date is: 2 days              Patient currently is not medically stable to d/c.  Barriers to Discharge: Aspiration pneumonia requiring IV antimicrobial therapy-awaiting SNF  Antimicrobial agents: Anti-infectives (From admission,  onward)   Start     Dose/Rate Route Frequency Ordered Stop   07/22/20 1700  Ampicillin-Sulbactam (UNASYN) 3 g in sodium  chloride 0.9 % 100 mL IVPB        3 g 200 mL/hr over 30 Minutes Intravenous Every 6 hours 07/22/20 1640         Time spent: 25 minutes-Greater than 50% of this time was spent in counseling, explanation of diagnosis, planning of further management, and coordination of care.  MEDICATIONS: Scheduled Meds: . aspirin  81 mg Oral Daily  . feeding supplement  237 mL Oral TID BM  . ferrous sulfate  325 mg Oral BID WC  . finasteride  5 mg Oral Daily  . fluticasone furoate-vilanterol  1 puff Inhalation Daily  . folic acid  1 mg Oral Daily  . heparin  5,000 Units Subcutaneous Q8H  . hydrocerin   Topical BID  . metoprolol tartrate  12.5 mg Oral BID  . multivitamin with minerals  1 tablet Oral Daily  . nicotine  14 mg Transdermal Daily  . pantoprazole  40 mg Oral Daily  . phosphorus  250 mg Oral Daily  . thiamine  100 mg Intravenous Daily  . umeclidinium bromide  1 puff Inhalation Daily   Continuous Infusions: . ampicillin-sulbactam (UNASYN) IV 3 g (07/27/20 0858)   PRN Meds:.acetaminophen, albuterol, guaiFENesin-dextromethorphan   PHYSICAL EXAM: Vital signs: Vitals:   07/26/20 1907 07/26/20 2005 07/27/20 0015 07/27/20 0410  BP: 131/70  (!) 147/97 (!) 143/81  Pulse: (!) 120 (!) 120 (!) 105 (!) 103  Resp: 17  16 16   Temp: 98.1 F (36.7 C)  98.1 F (36.7 C) 98.3 F (36.8 C)  TempSrc: Oral  Oral Oral  SpO2: 97%  96% 97%  Weight:    71 kg  Height:       Filed Weights   07/24/20 0447 07/26/20 0544 07/27/20 0410  Weight: 70.8 kg 70.1 kg 71 kg   Body mass index is 20.1 kg/m.   Gen Exam:Alert awake-not in any distress HEENT:atraumatic, normocephalic Chest: B/L clear to auscultation anteriorly CVS:S1S2 regular Abdomen:soft non tender, non distended Extremities:no edema Neurology: Non focal Skin: no rash  I have personally reviewed following labs and imaging studies  LABORATORY DATA: CBC: Recent Labs  Lab 07/21/20 0206 07/22/20 0007 07/23/20 0217 07/24/20 0344  07/25/20 0218  WBC 5.8 7.9 10.7* 12.6* 11.1*  NEUTROABS 2.8 4.2 6.5 8.9*  --   HGB 8.0* 7.7* 8.0* 7.9* 7.6*  HCT 23.8* 23.5* 24.3* 24.5* 22.9*  MCV 107.7* 112.4* 110.5* 110.4* 106.5*  PLT 310 319 353 379 403*    Basic Metabolic Panel: Recent Labs  Lab 07/21/20 0206 07/21/20 0206 07/22/20 0007 07/23/20 0217 07/24/20 0344 07/25/20 0218 07/26/20 0501  NA 138   < > 141 136 138 137 138  K 3.4*   < > 4.0 3.7 4.0 3.9 3.8  CL 98   < > 103 99 101 102 104  CO2 29   < > 28 26 24 24 22   GLUCOSE 118*   < > 103* 97 97 92 80  BUN 13   < > 12 13 12 12 13   CREATININE 1.31*   < > 1.08 1.16 1.14 1.10 1.17  CALCIUM 8.7*   < > 8.6* 8.8* 8.6* 8.6* 8.7*  MG 2.0  --  1.6* 1.9 1.8 2.2  --   PHOS  --   --   --  1.0* 3.7 4.3 4.4   < > = values  in this interval not displayed.    GFR: Estimated Creatinine Clearance: 65.7 mL/min (by C-G formula based on SCr of 1.17 mg/dL).  Liver Function Tests: Recent Labs  Lab 07/21/20 0206 07/22/20 0007 07/23/20 0217 07/24/20 0344  AST 17 26 20 16   ALT 9 10 10 9   ALKPHOS 68 58 63 67  BILITOT 0.7 0.3 0.5 0.5  PROT 5.3* 5.2* 5.3* 5.3*  ALBUMIN 2.8* 2.6* 2.5* 2.4*   No results for input(s): LIPASE, AMYLASE in the last 168 hours. No results for input(s): AMMONIA in the last 168 hours.  Coagulation Profile: No results for input(s): INR, PROTIME in the last 168 hours.  Cardiac Enzymes: No results for input(s): CKTOTAL, CKMB, CKMBINDEX, TROPONINI in the last 168 hours.  BNP (last 3 results) No results for input(s): PROBNP in the last 8760 hours.  Lipid Profile: No results for input(s): CHOL, HDL, LDLCALC, TRIG, CHOLHDL, LDLDIRECT in the last 72 hours.  Thyroid Function Tests: No results for input(s): TSH, T4TOTAL, FREET4, T3FREE, THYROIDAB in the last 72 hours.  Anemia Panel: No results for input(s): VITAMINB12, FOLATE, FERRITIN, TIBC, IRON, RETICCTPCT in the last 72 hours.  Urine analysis:    Component Value Date/Time   COLORURINE YELLOW  07/20/2020 0933   APPEARANCEUR HAZY (A) 07/20/2020 0933   LABSPEC 1.014 07/20/2020 0933   PHURINE 5.0 07/20/2020 0933   GLUCOSEU NEGATIVE 07/20/2020 0933   HGBUR NEGATIVE 07/20/2020 0933   BILIRUBINUR NEGATIVE 07/20/2020 0933   KETONESUR 80 (A) 07/20/2020 0933   PROTEINUR NEGATIVE 07/20/2020 0933   NITRITE NEGATIVE 07/20/2020 0933   LEUKOCYTESUR NEGATIVE 07/20/2020 0933    Sepsis Labs: Lactic Acid, Venous    Component Value Date/Time   LATICACIDVEN 1.0 07/21/2020 0206    MICROBIOLOGY: Recent Results (from the past 240 hour(s))  Respiratory Panel by RT PCR (Flu A&B, Covid) - Nasopharyngeal Swab     Status: None   Collection Time: 07/19/20 11:07 PM   Specimen: Nasopharyngeal Swab; Nasopharyngeal(NP) swabs in vial transport medium  Result Value Ref Range Status   SARS Coronavirus 2 by RT PCR NEGATIVE NEGATIVE Final    Comment: (NOTE) SARS-CoV-2 target nucleic acids are NOT DETECTED.  The SARS-CoV-2 RNA is generally detectable in upper respiratoy specimens during the acute phase of infection. The lowest concentration of SARS-CoV-2 viral copies this assay can detect is 131 copies/mL. A negative result does not preclude SARS-Cov-2 infection and should not be used as the sole basis for treatment or other patient management decisions. A negative result may occur with  improper specimen collection/handling, submission of specimen other than nasopharyngeal swab, presence of viral mutation(s) within the areas targeted by this assay, and inadequate number of viral copies (<131 copies/mL). A negative result must be combined with clinical observations, patient history, and epidemiological information. The expected result is Negative.  Fact Sheet for Patients:  PinkCheek.be  Fact Sheet for Healthcare Providers:  GravelBags.it  This test is no t yet approved or cleared by the [place] FDA and  has been authorized for  detection and/or diagnosis of SARS-CoV-2 by FDA under an Emergency Use Authorization (EUA). This EUA will remain  in effect (meaning this test can be used) for the duration of the COVID-19 declaration under Section 564(b)(1) of the Act, 21 U.S.C. section 360bbb-3(b)(1), unless the authorization is terminated or revoked sooner.     Influenza A by PCR NEGATIVE NEGATIVE Final   Influenza B by PCR NEGATIVE NEGATIVE Final    Comment: (NOTE) The Xpert Xpress SARS-CoV-2/FLU/RSV assay is  intended as an aid in  the diagnosis of influenza from Nasopharyngeal swab specimens and  should not be used as a sole basis for treatment. Nasal washings and  aspirates are unacceptable for Xpert Xpress SARS-CoV-2/FLU/RSV  testing.  Fact Sheet for Patients: PinkCheek.be  Fact Sheet for Healthcare Providers: GravelBags.it  This test is not yet approved or cleared by the [place] FDA and  has been authorized for detection and/or diagnosis of SARS-CoV-2 by  FDA under an Emergency Use Authorization (EUA). This EUA will remain  in effect (meaning this test can be used) for the duration of the  Covid-19 declaration under Section 564(b)(1) of the Act, 21  U.S.C. section 360bbb-3(b)(1), unless the authorization is  terminated or revoked. Performed at [facility], [place] [address] [place], [place] [address]   Culture, blood (routine x 2)     Status: None   Collection Time: 07/22/20  4:32 PM   Specimen: BLOOD  Result Value Ref Range Status   Specimen Description BLOOD RIGHT ANTECUBITAL  Final   Special Requests   Final    BOTTLES DRAWN AEROBIC AND ANAEROBIC Blood Culture adequate volume   Culture   Final    NO GROWTH 5 DAYS Performed at [facility], [place] [address] [place], [place] [address]    Report Status 07/27/2020 FINAL  Final  Culture, blood (routine x 2)     Status: None   Collection Time: 07/22/20  4:39 PM   Specimen:  BLOOD LEFT ARM  Result Value Ref Range Status   Specimen Description BLOOD LEFT ARM  Final   Special Requests   Final    BOTTLES DRAWN AEROBIC AND ANAEROBIC Blood Culture adequate volume   Culture   Final    NO GROWTH 5 DAYS Performed at [facility], [place] [address] [place], [place] [address]    Report Status 07/27/2020 FINAL  Final    RADIOLOGY STUDIES/RESULTS: No results found.   LOS: 8 days   Oren Binet, MD  [facility]    To contact the attending provider between 7A-7P or the covering provider during after hours 7P-7A, please log into the web site [email or web address] and access using universal [place] password for that web site. If you do not have the password, please call the hospital operator.  07/27/2020, 12:01 PM

## 2020-07-28 ENCOUNTER — Inpatient Hospital Stay (HOSPITAL_COMMUNITY): Payer: Medicare HMO

## 2020-07-28 DIAGNOSIS — E872 Acidosis: Secondary | ICD-10-CM | POA: Diagnosis not present

## 2020-07-28 DIAGNOSIS — F1020 Alcohol dependence, uncomplicated: Secondary | ICD-10-CM | POA: Diagnosis not present

## 2020-07-28 DIAGNOSIS — N179 Acute kidney failure, unspecified: Secondary | ICD-10-CM | POA: Diagnosis not present

## 2020-07-28 DIAGNOSIS — R296 Repeated falls: Secondary | ICD-10-CM | POA: Diagnosis not present

## 2020-07-28 LAB — BASIC METABOLIC PANEL
Anion gap: 13 (ref 5–15)
BUN: 11 mg/dL (ref 8–23)
CO2: 21 mmol/L — ABNORMAL LOW (ref 22–32)
Calcium: 9 mg/dL (ref 8.9–10.3)
Chloride: 103 mmol/L (ref 98–111)
Creatinine, Ser: 1.03 mg/dL (ref 0.61–1.24)
GFR, Estimated: >60 mL/min (ref >60)
Glucose, Bld: 88 mg/dL (ref 70–99)
Potassium: 4.1 mmol/L (ref 3.5–5.1)
Sodium: 137 mmol/L (ref 135–145)

## 2020-07-28 LAB — URINALYSIS, ROUTINE W REFLEX MICROSCOPIC
Bilirubin Urine: NEGATIVE
Glucose, UA: NEGATIVE mg/dL
Hgb urine dipstick: NEGATIVE
Ketones, ur: NEGATIVE mg/dL
Leukocytes,Ua: NEGATIVE
Nitrite: NEGATIVE
Protein, ur: NEGATIVE mg/dL
Specific Gravity, Urine: 1.008 (ref 1.005–1.030)
pH: 8 (ref 5.0–8.0)

## 2020-07-28 LAB — CBC WITH DIFFERENTIAL/PLATELET
Abs Immature Granulocytes: 0.08 10*3/uL — ABNORMAL HIGH (ref 0.00–0.07)
Basophils Absolute: 0.1 10*3/uL (ref 0.0–0.1)
Basophils Relative: 1 %
Eosinophils Absolute: 0.2 10*3/uL (ref 0.0–0.5)
Eosinophils Relative: 2 %
HCT: 21.8 % — ABNORMAL LOW (ref 39.0–52.0)
Hemoglobin: 7 g/dL — ABNORMAL LOW (ref 13.0–17.0)
Immature Granulocytes: 1 %
Lymphocytes Relative: 12 %
Lymphs Abs: 1.4 10*3/uL (ref 0.7–4.0)
MCH: 34.3 pg — ABNORMAL HIGH (ref 26.0–34.0)
MCHC: 32.1 g/dL (ref 30.0–36.0)
MCV: 106.9 fL — ABNORMAL HIGH (ref 80.0–100.0)
Monocytes Absolute: 2.2 10*3/uL — ABNORMAL HIGH (ref 0.1–1.0)
Monocytes Relative: 18 %
Neutro Abs: 8.2 10*3/uL — ABNORMAL HIGH (ref 1.7–7.7)
Neutrophils Relative %: 66 %
Platelets: 482 10*3/uL — ABNORMAL HIGH (ref 150–400)
RBC: 2.04 MIL/uL — ABNORMAL LOW (ref 4.22–5.81)
RDW: 18 % — ABNORMAL HIGH (ref 11.5–15.5)
WBC: 12.2 10*3/uL — ABNORMAL HIGH (ref 4.0–10.5)
nRBC: 0 % (ref 0.0–0.2)

## 2020-07-28 LAB — HEPATIC FUNCTION PANEL
ALT: 8 U/L (ref 0–44)
AST: 12 U/L — ABNORMAL LOW (ref 15–41)
Albumin: 2 g/dL — ABNORMAL LOW (ref 3.5–5.0)
Alkaline Phosphatase: 48 U/L (ref 38–126)
Bilirubin, Direct: <0.1 mg/dL (ref 0.0–0.2)
Total Bilirubin: 0.5 mg/dL (ref 0.3–1.2)
Total Protein: 5.3 g/dL — ABNORMAL LOW (ref 6.5–8.1)

## 2020-07-28 LAB — PHOSPHORUS: Phosphorus: 4.3 mg/dL (ref 2.5–4.6)

## 2020-07-28 LAB — METHYLMALONIC ACID, SERUM: Methylmalonic Acid, Quantitative: 345 nmol/L (ref 0–378)

## 2020-07-28 LAB — HEMOGLOBIN AND HEMATOCRIT, BLOOD
HCT: 24.8 % — ABNORMAL LOW (ref 39.0–52.0)
Hemoglobin: 8.5 g/dL — ABNORMAL LOW (ref 13.0–17.0)

## 2020-07-28 LAB — PREPARE RBC (CROSSMATCH)

## 2020-07-28 LAB — MAGNESIUM: Magnesium: 1.6 mg/dL — ABNORMAL LOW (ref 1.7–2.4)

## 2020-07-28 LAB — PROCALCITONIN: Procalcitonin: 0.22 ng/mL

## 2020-07-28 IMAGING — DX DG CHEST 1V PORT SAME DAY
1 series · 1 of 1 positions shown · non-contrast
Comparison: [DATE]

CLINICAL DATA: Shortness of breath

EXAM:
PORTABLE CHEST 1 VIEW

[chest ap]
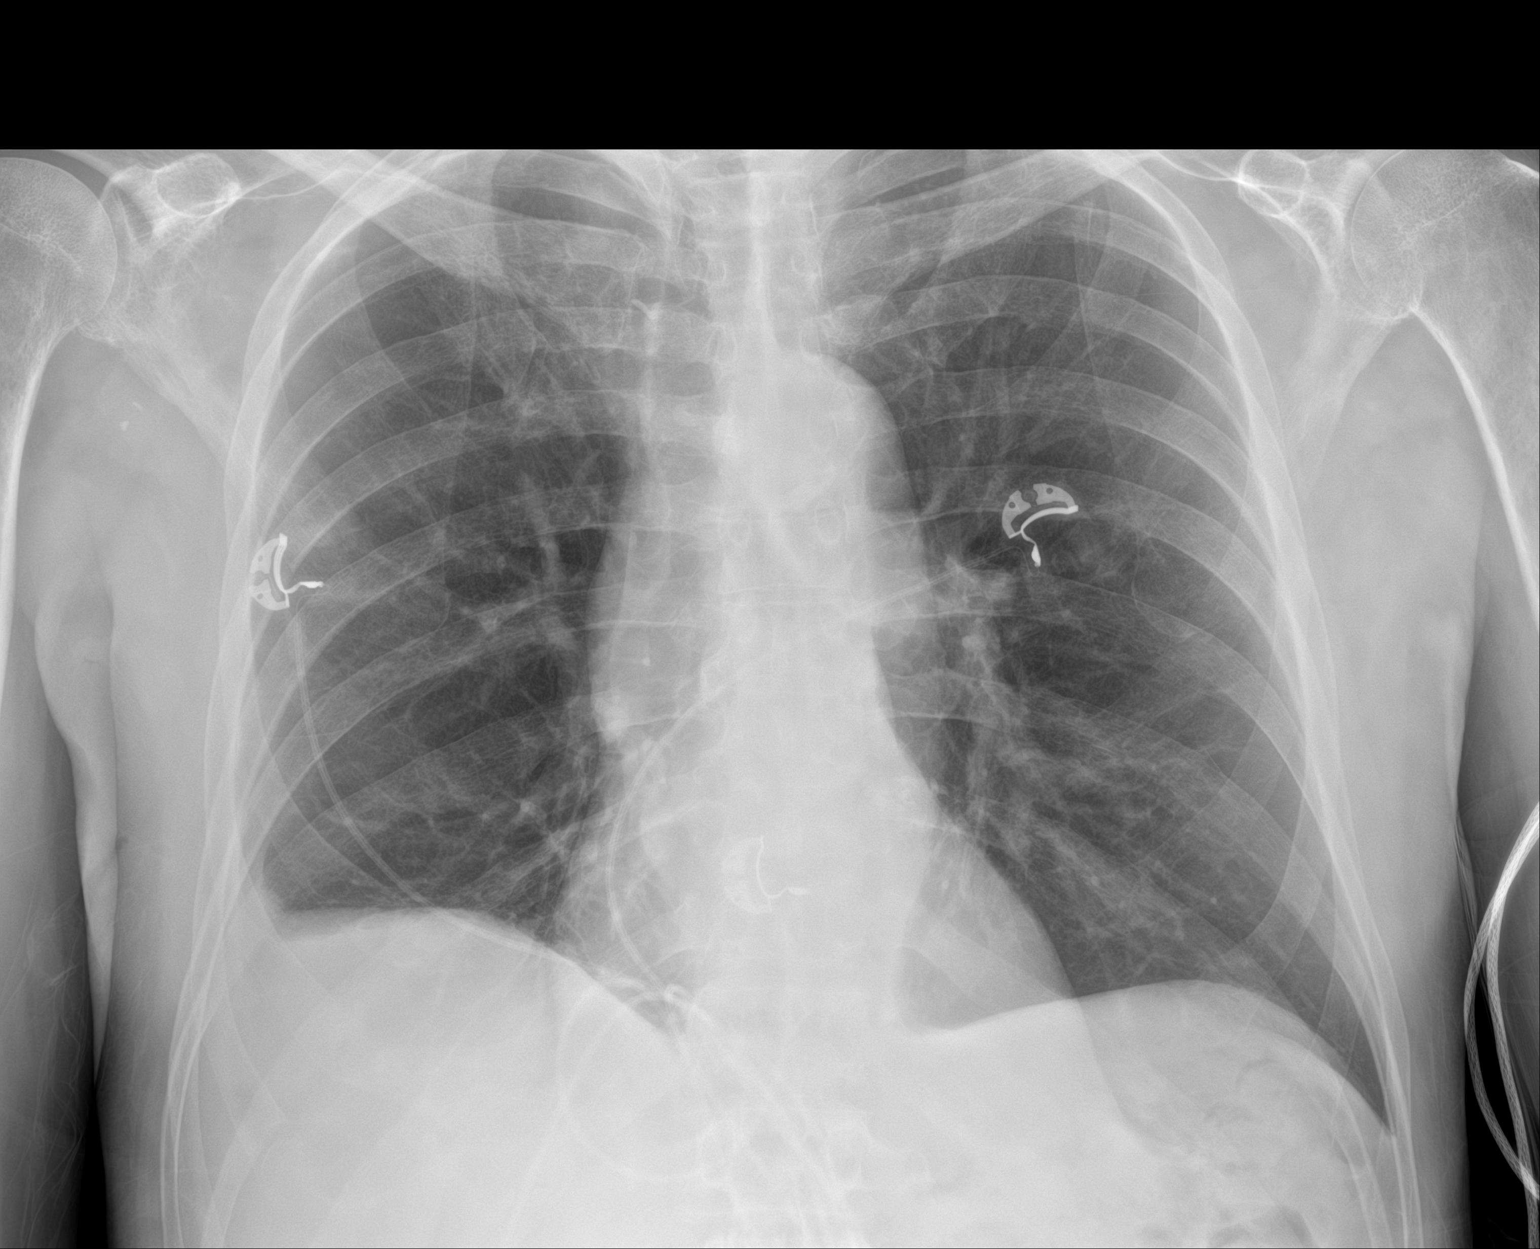

[1 of 1 positions shown; findings below may reference images not displayed]

FINDINGS: There is a small right pleural effusion with right base atelectasis.
Lungs elsewhere are clear. Heart size and pulmonary vascularity are
normal. No adenopathy. No pneumothorax. No bone lesions. There is
aortic atherosclerosis.
IMPRESSION: Small right pleural effusion with right base atelectasis. Lungs
elsewhere clear. Stable cardiac silhouette.

Aortic Atherosclerosis ([CE]-[CE]).

## 2020-07-28 MED ORDER — MAGNESIUM SULFATE 4 GM/100ML IV SOLN
4.0000 g | Freq: Once | INTRAVENOUS | Status: AC
  Administered 2020-07-28: 4 g via INTRAVENOUS
  Filled 2020-07-28: qty 100

## 2020-07-28 MED ORDER — SODIUM CHLORIDE 0.9% IV SOLUTION: Freq: Once | INTRAVENOUS | Status: AC

## 2020-07-28 MED ORDER — DIPHENHYDRAMINE HCL 50 MG/ML IJ SOLN
25.0000 mg | Freq: Once | INTRAMUSCULAR | Status: AC
  Administered 2020-07-28: 25 mg via INTRAVENOUS
  Filled 2020-07-28: qty 1

## 2020-07-28 MED ORDER — ACETAMINOPHEN 325 MG PO TABS
650.0000 mg | ORAL_TABLET | Freq: Once | ORAL | Status: AC
  Administered 2020-07-28: 650 mg via ORAL
  Filled 2020-07-28: qty 2

## 2020-07-28 NOTE — Progress Notes (Signed)
Initial Nutrition Assessment  DOCUMENTATION CODES:   Not applicable  INTERVENTION:    Continue to offer Ensure Enlive TID, each supplement provides 350 kcal and 20 grams of protein  Continue MVI with minerals daily  NUTRITION DIAGNOSIS:   Inadequate oral intake related to decreased appetite as evidenced by per patient/family report.  Ongoing  GOAL:   Patient will meet greater than or equal to 90% of their needs   Progressing   MONITOR:   PO intake, Supplement acceptance  REASON FOR ASSESSMENT:   Malnutrition Screening Tool    ASSESSMENT:   64 yo male admitted with frequent falls, 40 lb weight loss in 4-5 months. PMH includes depression, BPH, gout, HTN, stroke, alcohol dependence, tobacco use, marijuana use, COPD.   Patient is currently on a carbohydrate modified diet, 50-100% meal intakes recorded. He is also drinking Ensure Enlive 2-3 times per day. Patient reports good appetite and good oral intake. Awaiting SNF placement.  Labs reviewed. CBG: [phone]  Medications reviewed and include ferrous sulfate, folic acid, MVI with minerals, K phos, protonix, thiamine, vitamin B-12.  Diet Order:   Diet Order            Diet Carb Modified Fluid consistency: Thin; Room service appropriate? No  Diet effective now                 EDUCATION NEEDS:   Not appropriate for education at this time  Skin:  Skin Assessment: Reviewed RN Assessment  Last BM:  12/1  Height:   Ht Readings from Last 1 Encounters:  07/20/20 6\' 2"  (1.88 m)    Weight:   Wt Readings from Last 1 Encounters:  07/27/20 71 kg    Ideal Body Weight:  86.4 kg  BMI:  Body mass index is 20.1 kg/m.  Estimated Nutritional Needs:   Kcal:  2100-2300  Protein:  100-120 gm  Fluid:  >/= 2.1 L    Lucas Mallow, RD, LDN, CNSC Please refer to Amion for contact information.

## 2020-07-28 NOTE — Progress Notes (Signed)
   07/28/20 0411  Assess: MEWS Score  Temp (!) 101.3 F (38.5 C)  BP (!) 147/85  Pulse Rate (!) 108  ECG Heart Rate (!) 110  Resp 18  SpO2 100 %  O2 Device Room Air  Assess: MEWS Score  MEWS Temp 1  MEWS Systolic 0  MEWS Pulse 1  MEWS RR 0  MEWS LOC 0  MEWS Score 2  MEWS Score Color Yellow  Assess: if the MEWS score is Yellow or Red  Were vital signs taken at a resting state? Yes  Focused Assessment Change from prior assessment (see assessment flowsheet)  Early Detection of Sepsis Score *See Row Information* Medium  MEWS guidelines implemented *See Row Information* No, previously yellow, continue vital signs every 4 hours  Treat  MEWS Interventions Administered prn meds/treatments (Tylenol for fever)  Notify: Charge Nurse/RN  Name of Charge Nurse/RN Notified Jamal Collin  Date Charge Nurse/RN Notified 07/28/20  Time Charge Nurse/RN Notified 0430  Document  Patient Outcome Stabilized after interventions  Progress note created (see row info) Yes   Elaina Hoops, RN

## 2020-07-28 NOTE — Progress Notes (Signed)
PROGRESS NOTE        PATIENT DETAILS Name: Seth [name] Age: 64 y.o. Sex: male Date of Birth: May 09, 1956 Admit Date: 07/19/2020 Admitting Physician Shela Leff, MD GMW:NUUVO, Myra Rude, MD  Brief Narrative: Patient is a 64 y.o. male with history of EtOH use, CVA, gout, HTN, BPH, depression-we will presented with presyncope-found to have AKI, lactic acidosis, hypotension and admitted to the hospitalist service.  Significant events: 11/22: Admit for dehydration/AKI/hypotension.  Significant studies: 11/22>> CT head: No acute intracranial abnormality. 11/22>> chest x-ray: No acute cardiopulmonary disease. 11/23>> Echo: EF 60-65% 11/23>> renal ultrasound: No hydronephrosis, 2 small right renal cysts 11/25>> chest x-ray: Right> left basilar opacities-likely pneumonia. 12/1>> chest x-ray: Small right pleural effusion with right base atelectasis.  Antimicrobial therapy: Unasyn: 11/25>>11/30  Microbiology data: 11/25 >>blood culture: No growth  Procedures : None  Consults: None  DVT Prophylaxis : Place TED hose Start: 07/20/20 1522 heparin injection 5,000 Units Start: 07/20/20 0600   Subjective: Had a fever earlier this morning-denies any nausea, vomiting, diarrhea, abdominal pain.  No coughing-lung sounds are significantly better..  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  Resting blood pressure high-but still orthostatic.  Per PT-profoundly weak.  Continue thigh-high TED stockings continue supportive care-await repeat orthostatic vital signs today  AKI: Hemodynamically mediated in the setting of poor oral intake/dehydration.  Improved with supportive care.  Metabolic acidosis: Likely starvation ketosis-no indication of sepsis.  Improved with supportive care.    Hypophosphatemia: Secondary to alcohol  use-repleted  Hypomagnesemia/hypokalemia: Secondary to alcohol use-we will replete magnesium today.  Aspiration pneumonia: Had low-grade fever/wheezing on 11/25-had clinically improved-antimicrobial therapy was discontinued on 11/30-however had a fever earlier this morning.  Repeat chest x-ray without any pneumonia, UA without any UTI.  No vomiting/diarrhea or abdominal pain.  No source apparent-we will follow fever trend-and if fever reoccurs-May need ID consult at some point.  Note-patient is apparently fully vaccinated for Covid-claims he got his second dose of vaccine sometime in "October"  EtOH use: Awake/alert-alcohol withdrawal symptoms have resolved-no longer on any benzodiazepines.   HTN: Resting blood pressure stable-on low-dose metoprolol.  Continues to have some amount of orthostatic hypotension as well.  Difficult situation-really no good options-continue to follow closely.    History of CVA: No focal deficits-continue aspirin  GERD: Continue PPI  BPH: Due to orthostatic hypotension-Flomax stopped-on finasteride.  Claims he is urinating with no difficulty.  Amber-colored urine in Foley bag.  Anemia: Multifactorial anemia (folate/vitamin B12/iron deficiency) worsened by IV fluid dilution.  Hemoglobin down to 7.0 today-we will go ahead and transfuse 1 unit of PRBC.  COPD: Stable-no exacerbation-continue bronchodilators.  Debility/deconditioning: PT/OT eval completed-SNF on discharge.  Per PT note-unable to ambulate-social worker following for safe disposition-having difficulties with SNF placement.   Diet: Diet Order            Diet Carb Modified Fluid consistency: Thin; Room service appropriate? No  Diet effective now                  Code Status: Full code   Family Communication Sister-Ingrid [phone] voicemail-11/25, 11/26  Disposition Plan: Status is: Inpatient  Remains inpatient appropriate because:Inpatient level of care appropriate due to severity  of illness   Dispo: The patient is from: Home  Anticipated d/c is to: SNF              Anticipated d/c date is: 2 days              Patient currently is not medically stable to d/c.  Barriers to Discharge: Fever-recurrent-work-up in progress.  Worsening edema-plan to give 1 unit of PRBC today.  Antimicrobial agents: Anti-infectives (From admission, onward)   Start     Dose/Rate Route Frequency Ordered Stop   07/22/20 1700  Ampicillin-Sulbactam (UNASYN) 3 g in sodium chloride 0.9 % 100 mL IVPB        3 g 200 mL/hr over 30 Minutes Intravenous Every 6 hours 07/22/20 1640 07/27/20 2146       Time spent: 25 minutes-Greater than 50% of this time was spent in counseling, explanation of diagnosis, planning of further management, and coordination of care.  MEDICATIONS: Scheduled Meds: . aspirin  81 mg Oral Daily  . cyanocobalamin  1,000 mcg Intramuscular Q30 days  . feeding supplement  237 mL Oral TID BM  . ferrous sulfate  325 mg Oral BID WC  . finasteride  5 mg Oral Daily  . fluticasone furoate-vilanterol  1 puff Inhalation Daily  . folic acid  1 mg Oral Daily  . heparin  5,000 Units Subcutaneous Q8H  . hydrocerin   Topical BID  . metoprolol tartrate  12.5 mg Oral BID  . multivitamin with minerals  1 tablet Oral Daily  . nicotine  14 mg Transdermal Daily  . pantoprazole  40 mg Oral Daily  . phosphorus  250 mg Oral Daily  . thiamine  100 mg Intravenous Daily  . umeclidinium bromide  1 puff Inhalation Daily   Continuous Infusions:  PRN Meds:.acetaminophen, albuterol, guaiFENesin-dextromethorphan   PHYSICAL EXAM: Vital signs: Vitals:   07/28/20 0411 07/28/20 0537 07/28/20 0822 07/28/20 0850  BP: (!) 147/85  130/80   Pulse: (!) 108     Resp: 18  18   Temp: (!) 101.3 F (38.5 C) 100.2 F (37.9 C) 98.5 F (36.9 C)   TempSrc: Oral Oral Oral   SpO2: 100%   100%  Weight:      Height:       Filed Weights   07/24/20 0447 07/26/20 0544 07/27/20 0410  Weight:  70.8 kg 70.1 kg 71 kg   Body mass index is 20.1 kg/m.   Gen Exam:Alert awake-not in any distress HEENT:atraumatic, normocephalic Chest: B/L clear to auscultation anteriorly CVS:S1S2 regular Abdomen:soft non tender, non distended Extremities:no edema Neurology: Non focal Skin: no rash I have personally reviewed following labs and imaging studies  LABORATORY DATA: CBC: Recent Labs  Lab 07/22/20 0007 07/23/20 0217 07/24/20 0344 07/25/20 0218 07/28/20 0726  WBC 7.9 10.7* 12.6* 11.1* 12.2*  NEUTROABS 4.2 6.5 8.9*  --  8.2*  HGB 7.7* 8.0* 7.9* 7.6* 7.0*  HCT 23.5* 24.3* 24.5* 22.9* 21.8*  MCV 112.4* 110.5* 110.4* 106.5* 106.9*  PLT 319 353 379 403* 482*    Basic Metabolic Panel: Recent Labs  Lab 07/22/20 0007 07/22/20 0007 07/23/20 0217 07/24/20 0344 07/25/20 0218 07/26/20 0501 07/28/20 0346  NA 141   < > 136 138 137 138 137  K 4.0   < > 3.7 4.0 3.9 3.8 4.1  CL 103   < > 99 101 102 104 103  CO2 28   < > 26 24 24 22  21*  GLUCOSE 103*   < > 97 97 92 80 88  BUN 12   < > 13  12 12 13 11   CREATININE 1.08   < > 1.16 1.14 1.10 1.17 1.03  CALCIUM 8.6*   < > 8.8* 8.6* 8.6* 8.7* 9.0  MG 1.6*  --  1.9 1.8 2.2  --  1.6*  PHOS  --   --  1.0* 3.7 4.3 4.4 4.3   < > = values in this interval not displayed.    GFR: Estimated Creatinine Clearance: 74.7 mL/min (by C-G formula based on SCr of 1.03 mg/dL).  Liver Function Tests: Recent Labs  Lab 07/22/20 0007 07/23/20 0217 07/24/20 0344 07/28/20 0726  AST 26 20 16  12*  ALT 10 10 9 8   ALKPHOS 58 63 67 48  BILITOT 0.3 0.5 0.5 0.5  PROT 5.2* 5.3* 5.3* 5.3*  ALBUMIN 2.6* 2.5* 2.4* 2.0*   No results for input(s): LIPASE, AMYLASE in the last 168 hours. No results for input(s): AMMONIA in the last 168 hours.  Coagulation Profile: No results for input(s): INR, PROTIME in the last 168 hours.  Cardiac Enzymes: No results for input(s): CKTOTAL, CKMB, CKMBINDEX, TROPONINI in the last 168 hours.  BNP (last 3 results) No  results for input(s): PROBNP in the last 8760 hours.  Lipid Profile: No results for input(s): CHOL, HDL, LDLCALC, TRIG, CHOLHDL, LDLDIRECT in the last 72 hours.  Thyroid Function Tests: No results for input(s): TSH, T4TOTAL, FREET4, T3FREE, THYROIDAB in the last 72 hours.  Anemia Panel: No results for input(s): VITAMINB12, FOLATE, FERRITIN, TIBC, IRON, RETICCTPCT in the last 72 hours.  Urine analysis:    Component Value Date/Time   COLORURINE YELLOW 07/28/2020 0830   APPEARANCEUR CLEAR 07/28/2020 0830   LABSPEC 1.008 07/28/2020 0830   PHURINE 8.0 07/28/2020 0830   GLUCOSEU NEGATIVE 07/28/2020 0830   HGBUR NEGATIVE 07/28/2020 0830   BILIRUBINUR NEGATIVE 07/28/2020 0830   KETONESUR NEGATIVE 07/28/2020 0830   PROTEINUR NEGATIVE 07/28/2020 0830   NITRITE NEGATIVE 07/28/2020 0830   LEUKOCYTESUR NEGATIVE 07/28/2020 0830    Sepsis Labs: Lactic Acid, Venous    Component Value Date/Time   LATICACIDVEN 1.0 07/21/2020 0206    MICROBIOLOGY: Recent Results (from the past 240 hour(s))  Respiratory Panel by RT PCR (Flu A&B, Covid) - Nasopharyngeal Swab     Status: None   Collection Time: 07/19/20 11:07 PM   Specimen: Nasopharyngeal Swab; Nasopharyngeal(NP) swabs in vial transport medium  Result Value Ref Range Status   SARS Coronavirus 2 by RT PCR NEGATIVE NEGATIVE Final    Comment: (NOTE) SARS-CoV-2 target nucleic acids are NOT DETECTED.  The SARS-CoV-2 RNA is generally detectable in upper respiratoy specimens during the acute phase of infection. The lowest concentration of SARS-CoV-2 viral copies this assay can detect is 131 copies/mL. A negative result does not preclude SARS-Cov-2 infection and should not be used as the sole basis for treatment or other patient management decisions. A negative result may occur with  improper specimen collection/handling, submission of specimen other than nasopharyngeal swab, presence of viral mutation(s) within the areas targeted by this  assay, and inadequate number of viral copies (<131 copies/mL). A negative result must be combined with clinical observations, patient history, and epidemiological information. The expected result is Negative.  Fact Sheet for Patients:  PinkCheek.be  Fact Sheet for Healthcare Providers:  GravelBags.it  This test is no t yet approved or cleared by the [place] FDA and  has been authorized for detection and/or diagnosis of SARS-CoV-2 by FDA under an Emergency Use Authorization (EUA). This EUA will remain  in effect (meaning this test can  be used) for the duration of the COVID-19 declaration under Section 564(b)(1) of the Act, 21 U.S.C. section 360bbb-3(b)(1), unless the authorization is terminated or revoked sooner.     Influenza A by PCR NEGATIVE NEGATIVE Final   Influenza B by PCR NEGATIVE NEGATIVE Final    Comment: (NOTE) The Xpert Xpress SARS-CoV-2/FLU/RSV assay is intended as an aid in  the diagnosis of influenza from Nasopharyngeal swab specimens and  should not be used as a sole basis for treatment. Nasal washings and  aspirates are unacceptable for Xpert Xpress SARS-CoV-2/FLU/RSV  testing.  Fact Sheet for Patients: PinkCheek.be  Fact Sheet for Healthcare Providers: GravelBags.it  This test is not yet approved or cleared by the [place] FDA and  has been authorized for detection and/or diagnosis of SARS-CoV-2 by  FDA under an Emergency Use Authorization (EUA). This EUA will remain  in effect (meaning this test can be used) for the duration of the  Covid-19 declaration under Section 564(b)(1) of the Act, 21  U.S.C. section 360bbb-3(b)(1), unless the authorization is  terminated or revoked. Performed at [facility], [place] [address] [place], [place] [address]   Culture, blood (routine x 2)     Status: None   Collection Time: 07/22/20  4:32  PM   Specimen: BLOOD  Result Value Ref Range Status   Specimen Description BLOOD RIGHT ANTECUBITAL  Final   Special Requests   Final    BOTTLES DRAWN AEROBIC AND ANAEROBIC Blood Culture adequate volume   Culture   Final    NO GROWTH 5 DAYS Performed at [facility], [place] [address] [place], [place] [address]    Report Status 07/27/2020 FINAL  Final  Culture, blood (routine x 2)     Status: None   Collection Time: 07/22/20  4:39 PM   Specimen: BLOOD LEFT ARM  Result Value Ref Range Status   Specimen Description BLOOD LEFT ARM  Final   Special Requests   Final    BOTTLES DRAWN AEROBIC AND ANAEROBIC Blood Culture adequate volume   Culture   Final    NO GROWTH 5 DAYS Performed at [facility], [place] [address] [place], [place] [address]    Report Status 07/27/2020 FINAL  Final    RADIOLOGY STUDIES/RESULTS: DG Chest Port 1V same Day  Result Date: 07/28/2020 CLINICAL DATA:  Shortness of breath EXAM: PORTABLE CHEST 1 VIEW COMPARISON:  July 22, 2020 FINDINGS: There is a small right pleural effusion with right base atelectasis. Lungs elsewhere are clear. Heart size and pulmonary vascularity are normal. No adenopathy. No pneumothorax. No bone lesions. There is aortic atherosclerosis. IMPRESSION: Small right pleural effusion with right base atelectasis. Lungs elsewhere clear. Stable cardiac silhouette. Aortic Atherosclerosis (ICD10-I70.0). Electronically Signed   By: Lowella Grip III M.D.   On: 07/28/2020 08:38     LOS: 9 days   Oren Binet, MD  [facility]    To contact the attending provider between 7A-7P or the covering provider during after hours 7P-7A, please log into the web site [email or web address] and access using universal [place] password for that web site. If you do not have the password, please call the hospital operator.  07/28/2020, 2:48 PM

## 2020-07-28 NOTE — Progress Notes (Signed)
Physical Therapy Treatment Patient Details Name: Seth [name] MRN: [id number] DOB: [birth year] Today's Date: 07/28/2020    History of Present Illness 64 yo male with onset of dizziness and SOB with HA's was admitted.  Found AKI, orthostatic BP's, low BS and low K+.  PMHx:  c-spine fusion, gout, HTN, EtOH abuse, COPD, B cerebellar strokes, atherosclerosisl    PT Comments    Pt with improved participation during session. Pt requiring increased time and rest during therex, but able to perform wtihout assist. Pt with improved sit<>Stand transfer requiring min-mod A with use of RW. Pt able to take two steps to [facility] with Rw with min guard assist. Pt states he is feeling stronger. Pt does continues to be limited by strength, coordination, endurance, safety and balance and will benefit from skilled PT to address deficits to maximize independence with functional mobility prior to discharge    Follow Up Recommendations  SNF     Equipment Recommendations  Wheelchair (measurements PT);Wheelchair cushion (measurements PT);Rolling [place] with 5" wheels    Recommendations for Other Services       Precautions / Restrictions      Mobility  Bed Mobility   Bed Mobility: Supine to Sit;Sit to Supine     Supine to sit: Supervision;HOB elevated Sit to supine: Supervision;HOB elevated   General bed mobility comments: increased time needed  Transfers Overall transfer level: Needs assistance Equipment used: Rolling [place] (2 wheeled) Transfers: Sit to/from Stand Sit to Stand: Min assist;Mod assist         General transfer comment: performed sit to stand from elevated surface wtih min A for initial transfers, attempted second trials R LE with full extension, returning to seated position, attempted again wtih mod A needed to improve forward weight shift to inhibit RLE extension. pt able to maintain standingw ith RW with min guard >15 seconds both trials. Pt able to take 2 setps to [facility] with RW wtih  min guard assist  Ambulation/Gait                 Stairs             Wheelchair Mobility    Modified Rankin (Stroke Patients Only)       Balance                                            Cognition                                              Exercises General Exercises - Lower Extremity Ankle Circles/Pumps: AROM;Both;10 reps;Supine Gluteal Sets: AROM;Both;10 reps;Supine Long Arc Quad: AROM;Both;10 reps;Seated Heel Slides: AROM;AAROM;Both;10 reps;Supine Hip ABduction/ADduction: AAROM;AROM;Both;10 reps;Supine Straight Leg Raises: AROM;AAROM;Both;10 reps;Supine Hip Flexion/Marching: AROM;Both;10 reps;Seated    General Comments        Pertinent Vitals/Pain      Home Living                      Prior Function            PT Goals (current goals can now be found in the care plan section) Acute Rehab PT Goals Patient Stated Goal: to feel better PT Goal Formulation: With patient Time For Goal Achievement: 08/06/20 Potential to Achieve  Goals: Fair Progress towards PT goals: Progressing toward goals    Frequency    Min 2X/week      PT Plan Current plan remains appropriate;Frequency needs to be updated    Co-evaluation              AM-PAC PT "6 Clicks" Mobility   Outcome Measure  Help needed turning from your back to your side while in a flat bed without using bedrails?: None Help needed moving from lying on your back to sitting on the side of a flat bed without using bedrails?: A Little Help needed moving to and from a bed to a chair (including a wheelchair)?: A Lot Help needed standing up from a chair using your arms (e.g., wheelchair or bedside chair)?: A Lot Help needed to walk in hospital room?: A Lot Help needed climbing 3-5 steps with a railing? : Total 6 Click Score: 14    End of Session Equipment Utilized During Treatment: Gait belt Activity Tolerance: Patient tolerated  treatment well Patient left: in bed;with call bell/phone within reach;with bed alarm set Nurse Communication: Mobility status PT Visit Diagnosis: Unsteadiness on feet (R26.81);Muscle weakness (generalized) (M62.81);Difficulty in walking, not elsewhere classified (R26.2)     Time: 7017-7939 PT Time Calculation (min) (ACUTE ONLY): 26 min  Charges:  $Therapeutic Exercise: 23-37 mins                     Lyanne Co, DPT Acute Rehabilitation Services [phone]   [place] 07/28/2020, 12:53 PM

## 2020-07-29 DIAGNOSIS — E872 Acidosis: Secondary | ICD-10-CM | POA: Diagnosis not present

## 2020-07-29 DIAGNOSIS — R296 Repeated falls: Secondary | ICD-10-CM | POA: Diagnosis not present

## 2020-07-29 DIAGNOSIS — N179 Acute kidney failure, unspecified: Secondary | ICD-10-CM | POA: Diagnosis not present

## 2020-07-29 DIAGNOSIS — F1020 Alcohol dependence, uncomplicated: Secondary | ICD-10-CM | POA: Diagnosis not present

## 2020-07-29 LAB — TYPE AND SCREEN
ABO/RH(D): O POS
Antibody Screen: NEGATIVE
Unit division: 0

## 2020-07-29 LAB — GLUCOSE, CAPILLARY
Glucose-Capillary: 73 mg/dL (ref 70–99)
Glucose-Capillary: 83 mg/dL (ref 70–99)
Glucose-Capillary: 86 mg/dL (ref 70–99)
Glucose-Capillary: 112 mg/dL — ABNORMAL HIGH (ref 70–99)

## 2020-07-29 LAB — BPAM RBC
Blood Product Expiration Date: 202201032359
ISSUE DATE / TIME: 202112011802
Unit Type and Rh: 5100

## 2020-07-29 LAB — SARS CORONAVIRUS 2 BY RT PCR (HOSPITAL ORDER, PERFORMED IN ~~LOC~~ HOSPITAL LAB): SARS Coronavirus 2: NEGATIVE

## 2020-07-29 NOTE — Progress Notes (Signed)
PROGRESS NOTE        PATIENT DETAILS Name: Seth [name] Age: 64 y.o. Sex: male Date of Birth: Jul 09, 1956 Admit Date: 07/19/2020 Admitting Physician Shela Leff, MD YPP:[id number], Myra Rude, MD  Brief Narrative: Patient is a 64 y.o. male with history of EtOH use, CVA, gout, HTN, BPH, depression-we will presented with presyncope-found to have AKI, lactic acidosis, hypotension and admitted to the hospitalist service.  Significant events: 11/22: Admit for dehydration/AKI/hypotension.  Significant studies: 11/22>> CT head: No acute intracranial abnormality. 11/22>> chest x-ray: No acute cardiopulmonary disease. 11/23>> Echo: EF 60-65% 11/23>> renal ultrasound: No hydronephrosis, 2 small right renal cysts 11/25>> chest x-ray: Right> left basilar opacities-likely pneumonia. 12/1>> chest x-ray: Small right pleural effusion with right base atelectasis.  Antimicrobial therapy: Unasyn: 11/25>>11/30  Microbiology data: 11/25 >>blood culture: No growth  Procedures : None  Consults: None  DVT Prophylaxis : Place TED hose Start: 07/20/20 1522 heparin injection 5,000 Units Start: 07/20/20 0600   Subjective: Thinks he is getting stronger-no major issues overnight.  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  PT recommending SNF.  Orthostatic vital signs gradually improving-tolerating low-dose beta-blocker, TED hose.  No longer on Flomax.  AKI: Hemodynamically mediated in the setting of poor oral intake/dehydration.  Improved with supportive care.  Metabolic acidosis: Likely starvation ketosis-no indication of sepsis.  Improved with supportive care.    Hypophosphatemia: Secondary to alcohol use-repleted  Hypomagnesemia/hypokalemia: Secondary to alcohol use-we will replete magnesium today.  Aspiration pneumonia: Much improved-at times continues to have  intermittent fever.  No fever overnight.  No other sources of foci for infection apparent.  Fully vaccinated for Covid per patient.  Plans are to monitor-follow fever curve.  Has finished a course of empiric antimicrobial therapy.    EtOH use: Awake/alert-alcohol withdrawal symptoms have resolved-no longer on any benzodiazepines.   HTN: Resting blood pressure stable-on low-dose metoprolol.  Continues to have some amount of orthostatic hypotension as well.  Difficult situation-really no good options-continue to follow closely.    History of CVA: No focal deficits-continue aspirin  GERD: Continue PPI  BPH: Due to orthostatic hypotension-Flomax stopped-on finasteride.  Claims he is urinating with no difficulty.  Amber-colored urine in Foley bag.  Anemia: Multifactorial anemia (folate/vitamin B12/iron deficiency) worsened by IV fluid dilution.  Transfused 1 unit of PRBC on 12/1.  Hemoglobin stable at 8.5 post transfusion.    COPD: Stable-no exacerbation-continue bronchodilators.  Debility/deconditioning: PT/OT eval completed-SNF on discharge.  Social worker following.   Diet: Diet Order            Diet Carb Modified Fluid consistency: Thin; Room service appropriate? No  Diet effective now                  Code Status: Full code   Family Communication Sister-Ingrid [phone] voicemail-11/25, 11/26  Disposition Plan: Status is: Inpatient  Remains inpatient appropriate because:Inpatient level of care appropriate due to severity of illness   Dispo: The patient is from: Home              Anticipated d/c is to: SNF              Anticipated d/c date is: 2 days              Patient currently is not medically stable to d/c.  Barriers to Discharge: Fever-recurrent-work-up in progress.  Worsening edema-plan to give 1 unit of PRBC today.  Antimicrobial agents: Anti-infectives (From admission, onward)   Start     Dose/Rate Route Frequency Ordered Stop   07/22/20 1700   Ampicillin-Sulbactam (UNASYN) 3 g in sodium chloride 0.9 % 100 mL IVPB        3 g 200 mL/hr over 30 Minutes Intravenous Every 6 hours 07/22/20 1640 07/27/20 2146       Time spent: 25 minutes-Greater than 50% of this time was spent in counseling, explanation of diagnosis, planning of further management, and coordination of care.  MEDICATIONS: Scheduled Meds: . aspirin  81 mg Oral Daily  . cyanocobalamin  1,000 mcg Intramuscular Q30 days  . feeding supplement  237 mL Oral TID BM  . ferrous sulfate  325 mg Oral BID WC  . finasteride  5 mg Oral Daily  . fluticasone furoate-vilanterol  1 puff Inhalation Daily  . folic acid  1 mg Oral Daily  . heparin  5,000 Units Subcutaneous Q8H  . hydrocerin   Topical BID  . metoprolol tartrate  12.5 mg Oral BID  . multivitamin with minerals  1 tablet Oral Daily  . nicotine  14 mg Transdermal Daily  . pantoprazole  40 mg Oral Daily  . phosphorus  250 mg Oral Daily  . thiamine  100 mg Intravenous Daily  . umeclidinium bromide  1 puff Inhalation Daily   Continuous Infusions:  PRN Meds:.acetaminophen, albuterol, guaiFENesin-dextromethorphan   PHYSICAL EXAM: Vital signs: Vitals:   07/29/20 0816 07/29/20 0905 07/29/20 1055 07/29/20 1233  BP: (!) 151/87  112/61 (!) 128/94  Pulse: 95  (!) 101 92  Resp: 16  18 18   Temp: 98.3 F (36.8 C)   98.9 F (37.2 C)  TempSrc: Oral   Oral  SpO2: 100% 100% 100% 100%  Weight:      Height:       Filed Weights   07/26/20 0544 07/27/20 0410 07/29/20 0429  Weight: 70.1 kg 71 kg 68.8 kg   Body mass index is 19.47 kg/m.   Gen Exam:Alert awake-not in any distress HEENT:atraumatic, normocephalic Chest: B/L clear to auscultation anteriorly CVS:S1S2 regular Abdomen:soft non tender, non distended Extremities:no edema Neurology: Non focal Skin: no rash I have personally reviewed following labs and imaging studies  LABORATORY DATA: CBC: Recent Labs  Lab 07/23/20 0217 07/24/20 0344 07/25/20 0218  07/28/20 0726 07/28/20 2251  WBC 10.7* 12.6* 11.1* 12.2*  --   NEUTROABS 6.5 8.9*  --  8.2*  --   HGB 8.0* 7.9* 7.6* 7.0* 8.5*  HCT 24.3* 24.5* 22.9* 21.8* 24.8*  MCV 110.5* 110.4* 106.5* 106.9*  --   PLT 353 379 403* 482*  --     Basic Metabolic Panel: Recent Labs  Lab 07/23/20 0217 07/24/20 0344 07/25/20 0218 07/26/20 0501 07/28/20 0346  NA 136 138 137 138 137  K 3.7 4.0 3.9 3.8 4.1  CL 99 101 102 104 103  CO2 26 24 24 22  21*  GLUCOSE 97 97 92 80 88  BUN 13 12 12 13 11   CREATININE 1.16 1.14 1.10 1.17 1.03  CALCIUM 8.8* 8.6* 8.6* 8.7* 9.0  MG 1.9 1.8 2.2  --  1.6*  PHOS 1.0* 3.7 4.3 4.4 4.3    GFR: Estimated Creatinine Clearance: 72.4 mL/min (by C-G formula based on SCr of 1.03 mg/dL).  Liver Function Tests: Recent Labs  Lab 07/23/20 0217 07/24/20 0344 07/28/20 0726  AST 20 16 12*  ALT 10  9 8  ALKPHOS 63 67 48  BILITOT 0.5 0.5 0.5  PROT 5.3* 5.3* 5.3*  ALBUMIN 2.5* 2.4* 2.0*   No results for input(s): LIPASE, AMYLASE in the last 168 hours. No results for input(s): AMMONIA in the last 168 hours.  Coagulation Profile: No results for input(s): INR, PROTIME in the last 168 hours.  Cardiac Enzymes: No results for input(s): CKTOTAL, CKMB, CKMBINDEX, TROPONINI in the last 168 hours.  BNP (last 3 results) No results for input(s): PROBNP in the last 8760 hours.  Lipid Profile: No results for input(s): CHOL, HDL, LDLCALC, TRIG, CHOLHDL, LDLDIRECT in the last 72 hours.  Thyroid Function Tests: No results for input(s): TSH, T4TOTAL, FREET4, T3FREE, THYROIDAB in the last 72 hours.  Anemia Panel: No results for input(s): VITAMINB12, FOLATE, FERRITIN, TIBC, IRON, RETICCTPCT in the last 72 hours.  Urine analysis:    Component Value Date/Time   COLORURINE YELLOW 07/28/2020 0830   APPEARANCEUR CLEAR 07/28/2020 0830   LABSPEC 1.008 07/28/2020 0830   PHURINE 8.0 07/28/2020 0830   GLUCOSEU NEGATIVE 07/28/2020 0830   HGBUR NEGATIVE 07/28/2020 0830   BILIRUBINUR  NEGATIVE 07/28/2020 0830   KETONESUR NEGATIVE 07/28/2020 0830   PROTEINUR NEGATIVE 07/28/2020 0830   NITRITE NEGATIVE 07/28/2020 0830   LEUKOCYTESUR NEGATIVE 07/28/2020 0830    Sepsis Labs: Lactic Acid, Venous    Component Value Date/Time   LATICACIDVEN 1.0 07/21/2020 0206    MICROBIOLOGY: Recent Results (from the past 240 hour(s))  Respiratory Panel by RT PCR (Flu A&B, Covid) - Nasopharyngeal Swab     Status: None   Collection Time: 07/19/20 11:07 PM   Specimen: Nasopharyngeal Swab; Nasopharyngeal(NP) swabs in vial transport medium  Result Value Ref Range Status   SARS Coronavirus 2 by RT PCR NEGATIVE NEGATIVE Final    Comment: (NOTE) SARS-CoV-2 target nucleic acids are NOT DETECTED.  The SARS-CoV-2 RNA is generally detectable in upper respiratoy specimens during the acute phase of infection. The lowest concentration of SARS-CoV-2 viral copies this assay can detect is 131 copies/mL. A negative result does not preclude SARS-Cov-2 infection and should not be used as the sole basis for treatment or other patient management decisions. A negative result may occur with  improper specimen collection/handling, submission of specimen other than nasopharyngeal swab, presence of viral mutation(s) within the areas targeted by this assay, and inadequate number of viral copies (<131 copies/mL). A negative result must be combined with clinical observations, patient history, and epidemiological information. The expected result is Negative.  Fact Sheet for Patients:  PinkCheek.be  Fact Sheet for Healthcare Providers:  GravelBags.it  This test is no t yet approved or cleared by the [place] FDA and  has been authorized for detection and/or diagnosis of SARS-CoV-2 by FDA under an Emergency Use Authorization (EUA). This EUA will remain  in effect (meaning this test can be used) for the duration of the COVID-19 declaration under  Section 564(b)(1) of the Act, 21 U.S.C. section 360bbb-3(b)(1), unless the authorization is terminated or revoked sooner.     Influenza A by PCR NEGATIVE NEGATIVE Final   Influenza B by PCR NEGATIVE NEGATIVE Final    Comment: (NOTE) The Xpert Xpress SARS-CoV-2/FLU/RSV assay is intended as an aid in  the diagnosis of influenza from Nasopharyngeal swab specimens and  should not be used as a sole basis for treatment. Nasal washings and  aspirates are unacceptable for Xpert Xpress SARS-CoV-2/FLU/RSV  testing.  Fact Sheet for Patients: PinkCheek.be  Fact Sheet for Healthcare Providers: GravelBags.it  This test  is not yet approved or cleared by the [place] and  has been authorized for detection and/or diagnosis of SARS-CoV-2 by  FDA under an Emergency Use Authorization (EUA). This EUA will remain  in effect (meaning this test can be used) for the duration of the  Covid-19 declaration under Section 564(b)(1) of the Act, 21  U.S.C. section 360bbb-3(b)(1), unless the authorization is  terminated or revoked. Performed at [facility], [place] [address] [place], [place] [address]   Culture, blood (routine x 2)     Status: None   Collection Time: 07/22/20  4:32 PM   Specimen: BLOOD  Result Value Ref Range Status   Specimen Description BLOOD RIGHT ANTECUBITAL  Final   Special Requests   Final    BOTTLES DRAWN AEROBIC AND ANAEROBIC Blood Culture adequate volume   Culture   Final    NO GROWTH 5 DAYS Performed at [facility], [place] [address] [place], [place] [address]    Report Status 07/27/2020 FINAL  Final  Culture, blood (routine x 2)     Status: None   Collection Time: 07/22/20  4:39 PM   Specimen: BLOOD LEFT ARM  Result Value Ref Range Status   Specimen Description BLOOD LEFT ARM  Final   Special Requests   Final    BOTTLES DRAWN AEROBIC AND ANAEROBIC Blood Culture adequate volume   Culture   Final     NO GROWTH 5 DAYS Performed at [facility], [place] [address], [place], [place] [address]    Report Status 07/27/2020 FINAL  Final    RADIOLOGY STUDIES/RESULTS: DG Chest Port 1V same Day  Result Date: 07/28/2020 CLINICAL DATA:  Shortness of breath EXAM: PORTABLE CHEST 1 VIEW COMPARISON:  July 22, 2020 FINDINGS: There is a small right pleural effusion with right base atelectasis. Lungs elsewhere are clear. Heart size and pulmonary vascularity are normal. No adenopathy. No pneumothorax. No bone lesions. There is aortic atherosclerosis. IMPRESSION: Small right pleural effusion with right base atelectasis. Lungs elsewhere clear. Stable cardiac silhouette. Aortic Atherosclerosis (ICD10-I70.0). Electronically Signed   By: Lowella Grip III M.D.   On: 07/28/2020 08:38     LOS: 10 days   Oren Binet, MD  [facility]    To contact the attending provider between 7A-7P or the covering provider during after hours 7P-7A, please log into the web site [email or web address] and access using universal [place] password for that web site. If you do not have the password, please call the hospital operator.  07/29/2020, 3:24 PM

## 2020-07-29 NOTE — TOC Progression Note (Addendum)
Transition of Care [facility]) - Progression Note    Patient Details  Name: JAXIN FULFER MRN: [id number] Date of Birth: Jan 16, 1956  Transition of Care [facility]) CM/SW [place], [place] Phone Number: 07/29/2020, 12:04 PM  Clinical Narrative:      CSW spoke with patient at bedside and let him know that [facility] and [facility] can offer him a SNF bed. Patient chose to go to SNF at [facility]. CSW called Bryson Ha with [facility] and let her know that patient has accepted her SNF bed offer. Bryson Ha confirmed with CSW that she will start insurance authorization today.  Patient has SNF bed at [facility]. Insurance authorization is pending.  CSW will continue to follow.   Expected Discharge Plan: [place] Barriers to Discharge: Continued Medical Work up  Expected Discharge Plan and Services Expected Discharge Plan: [place] arrangements for the past 2 months: Single Family Home                                       Social Determinants of Health (SDOH) Interventions    Readmission Risk Interventions Readmission Risk Prevention Plan 06/04/2019  Transportation Screening Complete  PCP or Specialist Appt within 5-7 Days Complete  Home Care Screening Complete  Medication Review (RN CM) Complete  Some recent data might be hidden

## 2020-07-30 DIAGNOSIS — F1010 Alcohol abuse, uncomplicated: Secondary | ICD-10-CM | POA: Diagnosis not present

## 2020-07-30 DIAGNOSIS — E872 Acidosis: Secondary | ICD-10-CM | POA: Diagnosis not present

## 2020-07-30 DIAGNOSIS — N179 Acute kidney failure, unspecified: Secondary | ICD-10-CM | POA: Diagnosis not present

## 2020-07-30 DIAGNOSIS — R296 Repeated falls: Secondary | ICD-10-CM | POA: Diagnosis not present

## 2020-07-30 LAB — GLUCOSE, CAPILLARY
Glucose-Capillary: 90 mg/dL (ref 70–99)
Glucose-Capillary: 95 mg/dL (ref 70–99)
Glucose-Capillary: 108 mg/dL — ABNORMAL HIGH (ref 70–99)
Glucose-Capillary: 99 mg/dL (ref 70–99)

## 2020-07-30 MED ORDER — ASPIRIN 81 MG PO CHEW
81.0000 mg | CHEWABLE_TABLET | Freq: Every day | ORAL | Status: DC
Start: 2020-07-30 — End: 2022-03-02

## 2020-07-30 MED ORDER — SENNOSIDES-DOCUSATE SODIUM 8.6-50 MG PO TABS: 1.0000 | ORAL_TABLET | Freq: Every day | ORAL | Status: DC | PRN

## 2020-07-30 MED ORDER — ACETAMINOPHEN 325 MG PO TABS
650.0000 mg | ORAL_TABLET | Freq: Once | ORAL | Status: DC
  Filled 2020-07-30: qty 2

## 2020-07-30 MED ORDER — FERROUS SULFATE 325 (65 FE) MG PO TABS
325.0000 mg | ORAL_TABLET | Freq: Two times a day (BID) | ORAL | 3 refills | Status: DC
Start: 2020-07-30 — End: 2021-01-22

## 2020-07-30 MED ORDER — PANTOPRAZOLE SODIUM 40 MG PO TBEC
40.0000 mg | DELAYED_RELEASE_TABLET | Freq: Every day | ORAL | Status: DC
Start: 2020-07-30 — End: 2022-10-20

## 2020-07-30 MED ORDER — ENSURE ENLIVE PO LIQD
237.0000 mL | Freq: Three times a day (TID) | ORAL | 12 refills | Status: DC
Start: 2020-07-30 — End: 2022-12-24

## 2020-07-30 MED ORDER — CYANOCOBALAMIN 1000 MCG/ML IJ SOLN
1000.0000 ug | INTRAMUSCULAR | 0 refills | Status: DC
Start: 2020-08-26 — End: 2021-01-22

## 2020-07-30 MED ORDER — FLUTICASONE FUROATE-VILANTEROL 200-25 MCG/INH IN AEPB
1.0000 | INHALATION_SPRAY | Freq: Every day | RESPIRATORY_TRACT | Status: DC
Start: 2020-07-30 — End: 2021-05-24

## 2020-07-30 MED ORDER — METOPROLOL TARTRATE 25 MG PO TABS
12.5000 mg | ORAL_TABLET | Freq: Two times a day (BID) | ORAL | Status: DC
Start: 2020-07-30 — End: 2021-01-22

## 2020-07-30 MED ORDER — FINASTERIDE 5 MG PO TABS
5.0000 mg | ORAL_TABLET | Freq: Every day | ORAL | Status: DC
Start: 2020-07-30 — End: 2021-01-22

## 2020-07-30 NOTE — TOC Progression Note (Signed)
Transition of Care [facility]) - Progression Note    Patient Details  Name: Seth [name] MRN: [id number] Date of Birth: Apr 23, 1956  Transition of Care [facility]) CM/SW [place], [place] Phone Number: 07/30/2020, 12:06 PM  Clinical Narrative:     CSW spoke with Bryson Ha from [facility] who confirmed that insurance authorization is still pending for patient. CSW informed MD.  Patient has SNF bed at [facility]. Insurance authorization pending.  CSW will continue to follow.    Expected Discharge Plan: Skilled Nursing Facility Barriers to Discharge: Continued Medical Work up  Expected Discharge Plan and Services Expected Discharge Plan: [place] arrangements for the past 2 months: Single Family Home Expected Discharge Date: 07/30/20                                     Social Determinants of Health (SDOH) Interventions    Readmission Risk Interventions Readmission Risk Prevention Plan 06/04/2019  Transportation Screening Complete  PCP or Specialist Appt within 5-7 Days Complete  Home Care Screening Complete  Medication Review (RN CM) Complete  Some recent data might be hidden

## 2020-07-30 NOTE — Discharge Summary (Addendum)
PATIENT DETAILS Name: Seth [name] Age: 64 y.o. Sex: male Date of Birth: 07-19-1956 MRN: [id number]. Admitting Physician: Shela Leff, MD AYT:[id number], Myra Rude, MD  Admit Date: 07/19/2020 Discharge date: 07/31/2020 Recommendations for Outpatient Follow-up:  1. Follow up with PCP in 1-2 weeks 2. Please obtain CMP/CBC in one week  Admitted From:  Home  Disposition: SNF   Home Health: No  Equipment/Devices: None  Discharge Condition: Stable  CODE STATUS: FULL CODE  Diet recommendation:  Diet Order            Diet - low sodium heart healthy           Diet Carb Modified Fluid consistency: Thin; Room service appropriate? No  Diet effective now                  Brief Narrative: Patient is a 64 y.o. male with history of EtOH use, CVA, gout, HTN, BPH, depression-we will presented with presyncope-found to have AKI, lactic acidosis, hypotension and admitted to the hospitalist service.  Significant events: 11/22: Admit for dehydration/AKI/hypotension.  Significant studies: 11/22>> CT head: No acute intracranial abnormality. 11/22>> chest x-ray: No acute cardiopulmonary disease. 11/23>> Echo: EF 60-65% 11/23>> renal ultrasound: No hydronephrosis, 2 small right renal cysts 11/25>> chest x-ray: Right> left basilar opacities-likely pneumonia. 12/1>> chest x-ray: Small right pleural effusion with right base atelectasis.  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 Continue thigh-high TED stockings.   Slowly improving-last set of orthostatic vital signs much better-PT recommended SNF on discharge.   AKI: Hemodynamically mediated in the setting of poor oral intake/dehydration.  Improved with supportive  care.  Metabolic acidosis: Likely starvation ketosis-no indication of sepsis.  Improved with supportive care.    Hypophosphatemia: Secondary to alcohol use-repleted  Hypomagnesemia/hypokalemia: Secondary to alcohol use-repleted.  Aspiration pneumonia: Had low-grade fever/wheezing on 11/25-treated with a course of IV Unasyn with significant clinical improvement-afebrile for the past few days.  Blood cultures are negative so far.  Repeat chest x-ray without any pneumonia.  Fully vaccinated-apparently had a second shot of Covid vaccine sometime in "October".    EtOH use: Awake/alert-alcohol withdrawal symptoms have resolved-no longer on any benzodiazepines.   HTN: Resting blood pressure stable-on low-dose metoprolol.  Orthostatic vital signs much improved-no longer on Flomax.   Continue to follow closely-attending MD at SNF to optimize further.  Would avoid aggressive BP control in the setting.   History of CVA: No focal deficits-continue aspirin  GERD: Continue PPI  BPH: Due to orthostatic hypotension-Flomax stopped-on finasteride.  Claims he is urinating with no difficulty.  Amber-colored urine in Foley bag.  Anemia: Multifactorial anemia (folate/vitamin B12/iron deficiency) worsened by IV fluid dilution.  Transfused 1 unit of PRBC.  Hemoglobin posttransfusion stable.  Repeat CBC in 1 week.  Note-no indication of blood loss.  COPD: Stable-no exacerbation-continue bronchodilators.  Debility/deconditioning: PT/OT eval completed-SNF on discharge.     Discharge Diagnoses:  Principal Problem:   Metabolic acidosis Active Problems:   Chronic alcoholism (HCC)   DOE (dyspnea on exertion)   AKI (acute kidney injury) ([place])   Hypoglycemia   Discharge Instructions:  Activity:  As tolerated with Full fall precautions use Jessie/cane & assistance as needed  Discharge Instructions    Call MD for:  difficulty breathing, headache or visual disturbances   Complete by: As directed  Diet - low sodium heart healthy   Complete by: As directed    Discharge instructions   Complete by: As directed    Follow with Primary MD  Lucianne Lei, MD in 1-2 weeks  Please get a complete blood count and chemistry panel checked by your Primary MD at your next visit, and again as instructed by your Primary MD.  Get Medicines reviewed and adjusted: Please take all your medications with you for your next visit with your Primary MD  Laboratory/radiological data: Please request your Primary MD to go over all hospital tests and procedure/radiological results at the follow up, please ask your Primary MD to get all Hospital records sent to his/her office.  In some cases, they will be blood work, cultures and biopsy results pending at the time of your discharge. Please request that your primary care M.D. follows up on these results.  Also Note the following: If you experience worsening of your admission symptoms, develop shortness of breath, life threatening emergency, suicidal or homicidal thoughts you must seek medical attention immediately by calling 911 or calling your MD immediately  if symptoms less severe.  You must read complete instructions/literature along with all the possible adverse reactions/side effects for all the Medicines you take and that have been prescribed to you. Take any new Medicines after you have completely understood and accpet all the possible adverse reactions/side effects.   Do not drive when taking Pain medications or sleeping medications (Benzodaizepines)  Do not take more than prescribed Pain, Sleep and Anxiety Medications. It is not advisable to combine anxiety,sleep and pain medications without talking with your primary care practitioner  Special Instructions: If you have smoked or chewed Tobacco  in the last 2 yrs please stop smoking, stop any regular Alcohol  and or any Recreational drug use.  Wear Seat belts while driving.  Please note: You were  cared for by a hospitalist during your hospital stay. Once you are discharged, your primary care physician will handle any further medical issues. Please note that NO REFILLS for any discharge medications will be authorized once you are discharged, as it is imperative that you return to your primary care physician (or establish a relationship with a primary care physician if you do not have one) for your post hospital discharge needs so that they can reassess your need for medications and monitor your lab values.   Increase activity slowly   Complete by: As directed      Allergies as of 07/30/2020   No Known Allergies     Medication List    STOP taking these medications   Breztri Aerosphere 160-9-4.8 MCG/ACT Aero Generic drug: Budeson-Glycopyrrol-Formoterol   carvedilol 12.5 MG tablet Commonly known as: COREG   guaiFENesin-dextromethorphan 100-10 MG/5ML syrup Commonly known as: ROBITUSSIN DM   Spiriva HandiHaler 18 MCG inhalation capsule Generic drug: tiotropium   vitamin B-12 1000 MCG tablet Commonly known as: CYANOCOBALAMIN Replaced by: cyanocobalamin 1000 MCG/ML injection     TAKE these medications   acetaminophen 500 MG tablet Commonly known as: TYLENOL Take 500-1,000 mg by mouth every 6 (six) hours as needed (for headaches).   albuterol 108 (90 Base) MCG/ACT inhaler Commonly known as: VENTOLIN HFA Inhale 2 puffs into the lungs every 6 (six) hours as needed for wheezing or shortness of breath.   aspirin 81 MG chewable tablet Chew 1 tablet (81 mg total) by mouth daily.   cyanocobalamin 1000 MCG/ML injection Commonly known as: (VITAMIN B-12) Inject 1 mL (1,000 mcg total)  into the muscle every 30 (thirty) days. Start taking on: August 26, 2020 Replaces: vitamin B-12 1000 MCG tablet   feeding supplement Liqd Take 237 mLs by mouth 3 (three) times daily between meals.   ferrous sulfate 325 (65 FE) MG tablet Take 1 tablet (325 mg total) by mouth 2 (two) times daily  with a meal.   finasteride 5 MG tablet Commonly known as: PROSCAR Take 1 tablet (5 mg total) by mouth daily.   fluticasone furoate-vilanterol 200-25 MCG/INH Aepb Commonly known as: BREO ELLIPTA Inhale 1 puff into the lungs daily.   folic acid 1 MG tablet Commonly known as: FOLVITE Take 1 tablet (1 mg total) by mouth daily.   Incruse Ellipta 62.5 MCG/INH Aepb Generic drug: umeclidinium bromide INHALE 1 PUFF INTO THE LUNGS DAILY   metoprolol tartrate 25 MG tablet Commonly known as: LOPRESSOR Take 0.5 tablets (12.5 mg total) by mouth 2 (two) times daily.   multivitamin with minerals Tabs tablet Take 1 tablet by mouth daily.   pantoprazole 40 MG tablet Commonly known as: PROTONIX Take 1 tablet (40 mg total) by mouth daily.   thiamine 100 MG tablet Commonly known as: Vitamin B-1 Take 1 tablet (100 mg total) by mouth daily.       Contact information for follow-up providers    Lucianne Lei, MD. Schedule an appointment as soon as possible for a visit in 1 week(s).   Specialty: Family Medicine Contact information: [address] [place] [address] [phone]            Contact information for after-discharge care    [facility] .   Service: Skilled Nursing Contact information: [address] [place] [phone]                 No Known Allergies  Other Procedures/Studies: CT Head Wo Contrast  Result Date: 07/19/2020 CLINICAL DATA:  Dizziness EXAM: CT HEAD WITHOUT CONTRAST TECHNIQUE: Contiguous axial images were obtained from the base of the skull through the vertex without intravenous contrast. COMPARISON:  None. FINDINGS: Brain: There is periventricular hypoattenuation compatible with chronic microvascular disease. No extra-axial collection or intracranial hemorrhage. No midline shift or other mass effect. Generalized volume loss. Old bilateral cerebellar infarcts. Vascular:  Internal carotid artery atherosclerosis. Skull: Normal. Negative for fracture or focal lesion. Sinuses/Orbits: No acute finding. Other: None. IMPRESSION: 1. No acute intracranial abnormality. 2. Old bilateral cerebellar infarcts and chronic microvascular ischemia. Electronically Signed   By: Ulyses Jarred M.D.   On: 07/19/2020 22:07   US RENAL  Result Date: 07/20/2020 CLINICAL DATA:  Acute kidney injury. EXAM: RENAL / URINARY TRACT ULTRASOUND COMPLETE COMPARISON:  None. FINDINGS: Right Kidney: Renal measurements: 10.8 x 4.6 x 6.7 cm = volume: 176 mL. Echogenicity within normal limits. No solid mass or hydronephrosis visualized. Two adjacent upper pole cysts measuring 1.6 cm and 1.4 cm. Left Kidney: Renal measurements: 11.6 x 5.8 x 5.2 cm = volume: 185 mL. Echogenicity within normal limits. No mass or hydronephrosis identified although the kidney with suboptimally visualized due to patient body habitus. Bladder: Appears normal for degree of bladder distention. Other: None. IMPRESSION: 1. No hydronephrosis. 2. Two small right renal cysts. Electronically Signed   By: Logan Bores M.D.   On: 07/20/2020 10:20   DG Chest Portable 1 View  Result Date: 07/19/2020 CLINICAL DATA:  Shortness of breath, headache on off for 2 weeks. EXAM: PORTABLE CHEST 1 VIEW COMPARISON:  Chest x-ray 05/31/2019, CT chest 02/19/2019 FINDINGS: The heart size and mediastinal contours are within normal limits. Aortic arch calcifications. No focal consolidation. No pulmonary edema. Similar-appearing blunting of the right costophrenic angle. No pleural effusion. No pneumothorax. No acute osseous abnormality. IMPRESSION: No acute cardiopulmonary disease. Electronically Signed   By: Iven Finn M.D.   On: 07/19/2020 21:52   DG Chest Port 1V same Day  Result Date: 07/28/2020 CLINICAL DATA:  Shortness of breath EXAM: PORTABLE CHEST 1 VIEW COMPARISON:  July 22, 2020 FINDINGS: There is a small right pleural effusion with right base  atelectasis. Lungs elsewhere are clear. Heart size and pulmonary vascularity are normal. No adenopathy. No pneumothorax. No bone lesions. There is aortic atherosclerosis. IMPRESSION: Small right pleural effusion with right base atelectasis. Lungs elsewhere clear. Stable cardiac silhouette. Aortic Atherosclerosis (ICD10-I70.0). Electronically Signed   By: Lowella Grip III M.D.   On: 07/28/2020 08:38   DG Chest Port 1V same Day  Result Date: 07/22/2020 CLINICAL DATA:  Shortness of breath EXAM: PORTABLE CHEST 1 VIEW COMPARISON:  July 19, 2020 FINDINGS: The cardiomediastinal silhouette is unchanged in contour when accounting for differences in technique.Tortuous thoracic aorta. Elevation the RIGHT hemidiaphragm, unchanged. No pleural effusion. No pneumothorax. RIGHT basilar heterogeneous opacities, new since prior. Faint LEFT basilar heterogeneous opacities. Visualized abdomen is unremarkable. Multilevel degenerative changes of the thoracic spine. IMPRESSION: Mild RIGHT greater than LEFT basilar heterogeneous opacities. Differential considerations include infection, aspiration or atelectasis. Electronically Signed   By: Valentino Saxon MD   On: 07/22/2020 09:23   ECHOCARDIOGRAM COMPLETE  Result Date: 07/20/2020    ECHOCARDIOGRAM REPORT   Patient Name:   Seth [name] Date of Exam: 07/20/2020 Medical Rec #:  [id number]       Height:       74.0 in Accession #:    [id number]      Weight:       141.4 lb Date of Birth:  [birth year]        BSA:          1.876 m Patient Age:    74 years        BP:           115/84 mmHg Patient Gender: M               HR:           95 bpm. Exam Location:  Inpatient Procedure: 2D Echo, Color Doppler and Cardiac Doppler Indications:    R06.9 DOE  History:        Patient has prior history of Echocardiogram examinations, most                 recent 02/23/2019. COPD; Risk Factors:ETOH Abuse and                 Hypertension.  Sonographer:    Raquel Sarna Senior RDCS Referring Phys:  [id number] Shela Leff  Sonographer Comments: Very technically difficult study due to small rib spacing and lung interference. IMPRESSIONS  1. Left ventricular ejection fraction, by estimation, is 60 to 65%. The left ventricle has normal function. The left ventricle has no regional wall motion abnormalities. Left ventricular diastolic parameters were normal.  2. Right ventricular systolic function is normal. The right ventricular size is normal.  3. The mitral valve is normal in structure. No evidence of mitral valve regurgitation. No evidence of mitral stenosis.  4. The aortic valve is normal in structure. Aortic valve regurgitation is not visualized. No aortic  stenosis is present.  5. The inferior vena cava is normal in size with greater than 50% respiratory variability, suggesting right atrial pressure of 3 mmHg. FINDINGS  Left Ventricle: Left ventricular ejection fraction, by estimation, is 60 to 65%. The left ventricle has normal function. The left ventricle has no regional wall motion abnormalities. The left ventricular internal cavity size was normal in size. There is  no left ventricular hypertrophy. Left ventricular diastolic parameters were normal. Normal left ventricular filling pressure. Right Ventricle: The right ventricular size is normal. No increase in right ventricular wall thickness. Right ventricular systolic function is normal. Left Atrium: Left atrial size was normal in size. Right Atrium: Right atrial size was normal in size. Pericardium: There is no evidence of pericardial effusion. Mitral Valve: The mitral valve is normal in structure. No evidence of mitral valve regurgitation. No evidence of mitral valve stenosis. Tricuspid Valve: The tricuspid valve is normal in structure. Tricuspid valve regurgitation is not demonstrated. No evidence of tricuspid stenosis. Aortic Valve: The aortic valve is normal in structure. Aortic valve regurgitation is not visualized. No aortic stenosis is present.  Pulmonic Valve: The pulmonic valve was normal in structure. Pulmonic valve regurgitation is not visualized. No evidence of pulmonic stenosis. Aorta: The aortic root is normal in size and structure. Venous: The inferior vena cava is normal in size with greater than 50% respiratory variability, suggesting right atrial pressure of 3 mmHg. IAS/Shunts: No atrial level shunt detected by color flow Doppler.  LEFT VENTRICLE PLAX 2D LVIDd:         3.10 cm  Diastology LVIDs:         1.90 cm  LV e' medial:    7.94 cm/s LV PW:         1.10 cm  LV E/e' medial:  7.9 LV IVS:        1.10 cm  LV e' lateral:   11.00 cm/s LVOT diam:     2.00 cm  LV E/e' lateral: 5.7 LV SV:         58 LV SV Index:   31 LVOT Area:     3.14 cm  RIGHT VENTRICLE RV S prime:     12.00 cm/s TAPSE (M-mode): 2.1 cm LEFT ATRIUM             Index       RIGHT ATRIUM           Index LA diam:        2.90 cm 1.55 cm/m  RA Area:     10.60 cm LA Vol (A2C):   37.9 ml 20.21 ml/m RA Volume:   22.20 ml  11.84 ml/m LA Vol (A4C):   26.7 ml 14.23 ml/m LA Biplane Vol: 34.1 ml 18.18 ml/m  AORTIC VALVE LVOT Vmax:   91.40 cm/s LVOT Vmean:  78.200 cm/s LVOT VTI:    0.186 m  AORTA Ao Root diam: 3.70 cm MITRAL VALVE MV Area (PHT): 3.24 cm    SHUNTS MV Decel Time: 234 msec    Systemic VTI:  0.19 m MV E velocity: 62.60 cm/s  Systemic Diam: 2.00 cm MV A velocity: 66.80 cm/s MV E/A ratio:  0.94 Mihai Croitoru MD Electronically signed by Sanda Klein MD Signature Date/Time: 07/20/2020/2:17:47 PM    Final      TODAY-DAY OF DISCHARGE:  Subjective:   Seth [name] today has no headache,no chest abdominal pain,no new weakness tingling or numbness, feels much better wants to go home today.   Objective:  Blood pressure 136/81, pulse 100, temperature 98.4 F (36.9 C), temperature source Oral, resp. rate 18, height 6\' 2"  (1.88 m), weight 68.9 kg, SpO2 99 %.  Intake/Output Summary (Last 24 hours) at 07/30/2020 0723 Last data filed at 07/30/2020 3329 Gross per 24 hour   Intake 240 ml  Output 450 ml  Net -210 ml   Filed Weights   07/27/20 0410 07/29/20 0429 07/30/20 0620  Weight: 71 kg 68.8 kg 68.9 kg    Exam: Awake Alert, Oriented *3, No new F.N deficits, Normal affect [place].AT,PERRAL Supple Neck,No JVD, No cervical lymphadenopathy appriciated.  Symmetrical Chest wall movement, Good air movement bilaterally, CTAB RRR,No Gallops,Rubs or new Murmurs, No Parasternal Heave +ve B.Sounds, Abd Soft, Non tender, No organomegaly appriciated, No rebound -guarding or rigidity. No Cyanosis, Clubbing or edema, No new Rash or bruise   PERTINENT RADIOLOGIC STUDIES: DG Chest Port 1V same Day  Result Date: 07/28/2020 CLINICAL DATA:  Shortness of breath EXAM: PORTABLE CHEST 1 VIEW COMPARISON:  July 22, 2020 FINDINGS: There is a small right pleural effusion with right base atelectasis. Lungs elsewhere are clear. Heart size and pulmonary vascularity are normal. No adenopathy. No pneumothorax. No bone lesions. There is aortic atherosclerosis. IMPRESSION: Small right pleural effusion with right base atelectasis. Lungs elsewhere clear. Stable cardiac silhouette. Aortic Atherosclerosis (ICD10-I70.0). Electronically Signed   By: Lowella Grip III M.D.   On: 07/28/2020 08:38     PERTINENT LAB RESULTS: CBC: Recent Labs    07/28/20 0726 07/28/20 2251  WBC 12.2*  --   HGB 7.0* 8.5*  HCT 21.8* 24.8*  PLT 482*  --    CMET CMP     Component Value Date/Time   NA 137 07/28/2020 0346   NA 145 (H) 05/08/2018 1317   K 4.1 07/28/2020 0346   CL 103 07/28/2020 0346   CO2 21 (L) 07/28/2020 0346   GLUCOSE 88 07/28/2020 0346   BUN 11 07/28/2020 0346   BUN 8 05/08/2018 1317   CREATININE 1.03 07/28/2020 0346   CALCIUM 9.0 07/28/2020 0346   PROT 5.3 (L) 07/28/2020 0726   PROT 6.8 05/08/2018 1317   ALBUMIN 2.0 (L) 07/28/2020 0726   ALBUMIN 4.2 05/08/2018 1317   AST 12 (L) 07/28/2020 0726   ALT 8 07/28/2020 0726   ALKPHOS 48 07/28/2020 0726   BILITOT 0.5 07/28/2020  0726   BILITOT 0.5 05/08/2018 1317   GFRNONAA >60 07/28/2020 0346   GFRAA >60 06/03/2019 0514    GFR Estimated Creatinine Clearance: 72.5 mL/min (by C-G formula based on SCr of 1.03 mg/dL). No results for input(s): LIPASE, AMYLASE in the last 72 hours. No results for input(s): CKTOTAL, CKMB, CKMBINDEX, TROPONINI in the last 72 hours. Invalid input(s): POCBNP No results for input(s): DDIMER in the last 72 hours. No results for input(s): HGBA1C in the last 72 hours. No results for input(s): CHOL, HDL, LDLCALC, TRIG, CHOLHDL, LDLDIRECT in the last 72 hours. No results for input(s): TSH, T4TOTAL, T3FREE, THYROIDAB in the last 72 hours.  Invalid input(s): FREET3 No results for input(s): VITAMINB12, FOLATE, FERRITIN, TIBC, IRON, RETICCTPCT in the last 72 hours. Coags: No results for input(s): INR in the last 72 hours.  Invalid input(s): PT Microbiology: Recent Results (from the past 240 hour(s))  Culture, blood (routine x 2)     Status: None   Collection Time: 07/22/20  4:32 PM   Specimen: BLOOD  Result Value Ref Range Status   Specimen Description BLOOD RIGHT ANTECUBITAL  Final   Special Requests  Final    BOTTLES DRAWN AEROBIC AND ANAEROBIC Blood Culture adequate volume   Culture   Final    NO GROWTH 5 DAYS Performed at [facility], [place] [address], [place], [place] [address]    Report Status 07/27/2020 FINAL  Final  Culture, blood (routine x 2)     Status: None   Collection Time: 07/22/20  4:39 PM   Specimen: BLOOD LEFT ARM  Result Value Ref Range Status   Specimen Description BLOOD LEFT ARM  Final   Special Requests   Final    BOTTLES DRAWN AEROBIC AND ANAEROBIC Blood Culture adequate volume   Culture   Final    NO GROWTH 5 DAYS Performed at [facility], [place] [address], [place], [place] [address]    Report Status 07/27/2020 FINAL  Final  SARS Coronavirus 2 by RT PCR (hospital order, performed in [facility] hospital lab) Nasopharyngeal Nasopharyngeal Swab      Status: None   Collection Time: 07/29/20  3:24 PM   Specimen: Nasopharyngeal Swab  Result Value Ref Range Status   SARS Coronavirus 2 NEGATIVE NEGATIVE Final    Comment: (NOTE) SARS-CoV-2 target nucleic acids are NOT DETECTED.  The SARS-CoV-2 RNA is generally detectable in upper and lower respiratory specimens during the acute phase of infection. The lowest concentration of SARS-CoV-2 viral copies this assay can detect is 250 copies / mL. A negative result does not preclude SARS-CoV-2 infection and should not be used as the sole basis for treatment or other patient management decisions.  A negative result may occur with improper specimen collection / handling, submission of specimen other than nasopharyngeal swab, presence of viral mutation(s) within the areas targeted by this assay, and inadequate number of viral copies (<250 copies / mL). A negative result must be combined with clinical observations, patient history, and epidemiological information.  Fact Sheet for Patients:   StrictlyIdeas.no  Fact Sheet for Healthcare Providers: BankingDealers.co.za  This test is not yet approved or  cleared by the [place] FDA and has been authorized for detection and/or diagnosis of SARS-CoV-2 by FDA under an Emergency Use Authorization (EUA).  This EUA will remain in effect (meaning this test can be used) for the duration of the COVID-19 declaration under Section 564(b)(1) of the Act, 21 U.S.C. section 360bbb-3(b)(1), unless the authorization is terminated or revoked sooner.  Performed at [facility], [place] [address], [place],  [address]     FURTHER DISCHARGE INSTRUCTIONS:  Get Medicines reviewed and adjusted: Please take all your medications with you for your next visit with your Primary MD  Laboratory/radiological data: Please request your Primary MD to go over all hospital tests and procedure/radiological results  at the follow up, please ask your Primary MD to get all Hospital records sent to his/her office.  In some cases, they will be blood work, cultures and biopsy results pending at the time of your discharge. Please request that your primary care M.D. goes through all the records of your hospital data and follows up on these results.  Also Note the following: If you experience worsening of your admission symptoms, develop shortness of breath, life threatening emergency, suicidal or homicidal thoughts you must seek medical attention immediately by calling 911 or calling your MD immediately  if symptoms less severe.  You must read complete instructions/literature along with all the possible adverse reactions/side effects for all the Medicines you take and that have been prescribed to you. Take any new Medicines  after you have completely understood and accpet all the possible adverse reactions/side effects.   Do not drive when taking Pain medications or sleeping medications (Benzodaizepines)  Do not take more than prescribed Pain, Sleep and Anxiety Medications. It is not advisable to combine anxiety,sleep and pain medications without talking with your primary care practitioner  Special Instructions: If you have smoked or chewed Tobacco  in the last 2 yrs please stop smoking, stop any regular Alcohol  and or any Recreational drug use.  Wear Seat belts while driving.  Please note: You were cared for by a hospitalist during your hospital stay. Once you are discharged, your primary care physician will handle any further medical issues. Please note that NO REFILLS for any discharge medications will be authorized once you are discharged, as it is imperative that you return to your primary care physician (or establish a relationship with a primary care physician if you do not have one) for your post hospital discharge needs so that they can reassess your need for medications and monitor your lab values.  Total  Time spent coordinating discharge including counseling, education and face to face time equals 35 minutes.  SignedOren Binet 07/30/2020 7:23 AM

## 2020-07-30 NOTE — Care Management Important Message (Signed)
Important Message  Patient Details  Name: Seth [name] MRN: [id number] Date of Birth: 1956-05-11   Medicare Important Message Given:  Yes     Ellerie Arenz P Davine Coba 07/30/2020, 3:13 PM

## 2020-07-31 DIAGNOSIS — Z7289 Other problems related to lifestyle: Secondary | ICD-10-CM | POA: Diagnosis not present

## 2020-07-31 DIAGNOSIS — I10 Essential (primary) hypertension: Secondary | ICD-10-CM | POA: Diagnosis not present

## 2020-07-31 DIAGNOSIS — E860 Dehydration: Secondary | ICD-10-CM | POA: Diagnosis not present

## 2020-07-31 DIAGNOSIS — R531 Weakness: Secondary | ICD-10-CM | POA: Diagnosis not present

## 2020-07-31 DIAGNOSIS — N179 Acute kidney failure, unspecified: Secondary | ICD-10-CM | POA: Diagnosis not present

## 2020-07-31 DIAGNOSIS — R55 Syncope and collapse: Secondary | ICD-10-CM | POA: Diagnosis not present

## 2020-07-31 DIAGNOSIS — J690 Pneumonitis due to inhalation of food and vomit: Secondary | ICD-10-CM | POA: Diagnosis not present

## 2020-07-31 DIAGNOSIS — M109 Gout, unspecified: Secondary | ICD-10-CM | POA: Diagnosis not present

## 2020-07-31 DIAGNOSIS — Z7401 Bed confinement status: Secondary | ICD-10-CM | POA: Diagnosis not present

## 2020-07-31 DIAGNOSIS — M2550 Pain in unspecified joint: Secondary | ICD-10-CM | POA: Diagnosis not present

## 2020-07-31 DIAGNOSIS — E872 Acidosis: Secondary | ICD-10-CM | POA: Diagnosis not present

## 2020-07-31 DIAGNOSIS — J449 Chronic obstructive pulmonary disease, unspecified: Secondary | ICD-10-CM | POA: Diagnosis not present

## 2020-07-31 DIAGNOSIS — D649 Anemia, unspecified: Secondary | ICD-10-CM | POA: Diagnosis not present

## 2020-07-31 DIAGNOSIS — I959 Hypotension, unspecified: Secondary | ICD-10-CM | POA: Diagnosis not present

## 2020-07-31 DIAGNOSIS — R296 Repeated falls: Secondary | ICD-10-CM | POA: Diagnosis not present

## 2020-07-31 LAB — GLUCOSE, CAPILLARY
Glucose-Capillary: 72 mg/dL (ref 70–99)
Glucose-Capillary: 97 mg/dL (ref 70–99)

## 2020-07-31 NOTE — TOC Progression Note (Signed)
Transition of Care [facility]) - Progression Note    Patient Details  Name: Seth [name] MRN: [id number] Date of Birth: 12-22-1955  Transition of Care [facility]) CM/SW [place], [place] Phone Number: [phone] 07/31/2020, 9:21 AM  Clinical Narrative:     CSW spoke with Ebony Hail to ascertain if authorization has come back an she stated that it has not. Ebony Hail stated that she is checking to see if they can accept patient prior to authorization coming back per Zack's request. CSW asked for Ebony Hail to let her know the status.   TOC team will continue to assist with discharge planning needs.  Expected Discharge Plan: Skilled Nursing Facility Barriers to Discharge: Continued Medical Work up  Expected Discharge Plan and Services Expected Discharge Plan: [place] arrangements for the past 2 months: Single Family Home Expected Discharge Date: 07/30/20                                     Social Determinants of Health (SDOH) Interventions    Readmission Risk Interventions Readmission Risk Prevention Plan 06/04/2019  Transportation Screening Complete  PCP or Specialist Appt within 5-7 Days Complete  Home Care Screening Complete  Medication Review (RN CM) Complete  Some recent data might be hidden

## 2020-07-31 NOTE — Progress Notes (Signed)
PROGRESS NOTE    Seth [name]  BJS:[id number] DOB: [birth year] DOA: 07/19/2020 PCP: Lucianne Lei, MD   Brief Narrative: Patient is a 64 y.o. male with history of EtOH use, CVA, gout, HTN, BPH, depression - who presented with presyncope-found to have AKI, lactic acidosis, hypotension and admitted to the hospitalist service.  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 Continue thigh-high TED stockings.   Slowly improving-last set of orthostatic vital signs much better-PT recommended SNF on discharge.    AKI: Hemodynamically mediated in the setting of poor oral intake/dehydration.  Improved with supportive care.   Metabolic acidosis: Likely starvation ketosis-no indication of sepsis.  Improved with supportive care.     Hypophosphatemia: Secondary to alcohol use-repleted   Hypomagnesemia/hypokalemia: Secondary to alcohol use-repleted.   Aspiration pneumonia: Had low-grade fever/wheezing on 11/25-treated with a course of IV Unasyn with significant clinical improvement-afebrile for the past few days.  Blood cultures are negative so far.  Repeat chest x-ray without any pneumonia.  Fully vaccinated-apparently had a second shot of Covid vaccine sometime in "October".     EtOH use: Awake/alert-alcohol withdrawal symptoms have resolved-no longer on any benzodiazepines.    HTN: Resting blood pressure stable-on low-dose metoprolol.  Orthostatic vital signs much improved-no longer on Flomax.   Continue to follow closely-attending MD at SNF to optimize further.  Would avoid aggressive BP control in the setting.    History of CVA: No focal deficits-continue aspirin   GERD: Continue PPI   BPH: Due to orthostatic  hypotension-Flomax stopped-on finasteride.  Claims he is urinating with no difficulty.  Amber-colored urine in Foley bag.   Anemia: Multifactorial anemia (folate/vitamin B12/iron deficiency) worsened by IV fluid dilution.  Transfused 1 unit of PRBC.  Hemoglobin posttransfusion stable.  Repeat CBC in 1 week.  Note-no indication of blood loss.   COPD: Stable-no exacerbation-continue bronchodilators.   Debility/deconditioning: PT/OT eval completed-SNF on discharge.       DVT prophylaxis: heparin sq Code Status: Full code. Family Communication: No family at bed side. Disposition Plan:  SNF 07/01/20      Consultants:  None  Procedures: None Antimicrobials: Anti-infectives (From admission, onward)    Start     Dose/Rate Route Frequency Ordered Stop   07/22/20 1700  Ampicillin-Sulbactam (UNASYN) 3 g in sodium chloride 0.9 % 100 mL IVPB        3 g 200 mL/hr over 30 Minutes Intravenous Every 6 hours 07/22/20 1640 07/27/20 2146       Subjective: Patient was seen and examined at bedside.  Overnight events noted.   Patient is going to be discharged to the skilled nursing facility, awaiting insurance authorization. Patient denies any chest pain shortness of breath dizziness.  Objective: Vitals:   07/31/20 0521 07/31/20 0801 07/31/20 0857 07/31/20 1116  BP: 131/76 135/87  125/77  Pulse:  97  98  Resp:  18  20  Temp: 99 F (37.2 C) 98.9 F (37.2 C)  98.6 F (37 C)  TempSrc: Oral Oral  Oral  SpO2:  98% 97% 98%  Weight: 69.8 kg     Height:        Intake/Output Summary (Last 24 hours) at 07/31/2020 1158 Last data filed at 07/31/2020 0521 Gross per 24 hour  Intake 597 ml  Output 1325 ml  Net -728 ml   Filed Weights   07/29/20 0429 07/30/20 0620 07/31/20 0521  Weight: 68.8 kg 68.9 kg 69.8 kg    Examination:  General exam: Appears calm and comfortable  Respiratory system: Clear to auscultation. Respiratory effort normal. Cardiovascular system: S1 & S2 heard, RRR. No  JVD, murmurs, rubs, gallops or clicks. No pedal edema. Gastrointestinal system: Abdomen is nondistended, soft and nontender. No organomegaly or masses felt. Normal bowel sounds heard. Central nervous system: Alert and oriented. No focal neurological deficits. Extremities: Symmetric 5 x 5 power. Skin: No rashes, lesions or ulcers Psychiatry: Judgement and insight appear normal. Mood & affect appropriate.     Data Reviewed: I have personally reviewed following labs and imaging studies  CBC: Recent Labs  Lab 07/25/20 0218 07/28/20 0726 07/28/20 2251  WBC 11.1* 12.2*  --   NEUTROABS  --  8.2*  --   HGB 7.6* 7.0* 8.5*  HCT 22.9* 21.8* 24.8*  MCV 106.5* 106.9*  --   PLT 403* 482*  --    Basic Metabolic Panel: Recent Labs  Lab 07/25/20 0218 07/26/20 0501 07/28/20 0346  NA 137 138 137  K 3.9 3.8 4.1  CL 102 104 103  CO2 24 22 21*  GLUCOSE 92 80 88  BUN 12 13 11   CREATININE 1.10 1.17 1.03  CALCIUM 8.6* 8.7* 9.0  MG 2.2  --  1.6*  PHOS 4.3 4.4 4.3   GFR: Estimated Creatinine Clearance: 73.4 mL/min (by C-G formula based on SCr of 1.03 mg/dL). Liver Function Tests: Recent Labs  Lab 07/28/20 0726  AST 12*  ALT 8  ALKPHOS 48  BILITOT 0.5  PROT 5.3*  ALBUMIN 2.0*   No results for input(s): LIPASE, AMYLASE in the last 168 hours. No results for input(s): AMMONIA in the last 168 hours. Coagulation Profile: No results for input(s): INR, PROTIME in the last 168 hours. Cardiac Enzymes: No results for input(s): CKTOTAL, CKMB, CKMBINDEX, TROPONINI in the last 168 hours. BNP (last 3 results) No results for input(s): PROBNP in the last 8760 hours. HbA1C: No results for input(s): HGBA1C in the last 72 hours. CBG: Recent Labs  Lab 07/30/20 1100 07/30/20 1650 07/30/20 2216 07/31/20 0759 07/31/20 1113  GLUCAP 95 108* 99 72 97   Lipid Profile: No results for input(s): CHOL, HDL, LDLCALC, TRIG, CHOLHDL, LDLDIRECT in the last 72 hours. Thyroid Function Tests: No results  for input(s): TSH, T4TOTAL, FREET4, T3FREE, THYROIDAB in the last 72 hours. Anemia Panel: No results for input(s): VITAMINB12, FOLATE, FERRITIN, TIBC, IRON, RETICCTPCT in the last 72 hours. Sepsis Labs: Recent Labs  Lab 07/28/20 0726  PROCALCITON 0.22    Recent Results (from the past 240 hour(s))  Culture, blood (routine x 2)     Status: None   Collection Time: 07/22/20  4:32 PM   Specimen: BLOOD  Result Value Ref Range Status   Specimen Description BLOOD RIGHT ANTECUBITAL  Final   Special Requests   Final    BOTTLES DRAWN AEROBIC AND ANAEROBIC Blood Culture adequate volume   Culture   Final    NO GROWTH 5 DAYS Performed at [facility], [address], [place], [place] [address]    Report Status 07/27/2020 FINAL  Final  Culture, blood (routine x 2)     Status: None   Collection Time: 07/22/20  4:39 PM   Specimen: BLOOD LEFT ARM  Result Value Ref Range Status   Specimen Description BLOOD LEFT  ARM  Final   Special Requests   Final    BOTTLES DRAWN AEROBIC AND ANAEROBIC Blood Culture adequate volume   Culture   Final    NO GROWTH 5 DAYS Performed at [facility], [address], [place], [place] [address]    Report Status 07/27/2020 FINAL  Final  SARS Coronavirus 2 by RT PCR (hospital order, performed in [facility] hospital lab) Nasopharyngeal Nasopharyngeal Swab     Status: None   Collection Time: 07/29/20  3:24 PM   Specimen: Nasopharyngeal Swab  Result Value Ref Range Status   SARS Coronavirus 2 NEGATIVE NEGATIVE Final    Comment: (NOTE) SARS-CoV-2 target nucleic acids are NOT DETECTED.  The SARS-CoV-2 RNA is generally detectable in upper and lower respiratory specimens during the acute phase of infection. The lowest concentration of SARS-CoV-2 viral copies this assay can detect is 250 copies / mL. A negative result does not preclude SARS-CoV-2 infection and should not be used as the sole basis for treatment or other patient management decisions.  A  negative result may occur with improper specimen collection / handling, submission of specimen other than nasopharyngeal swab, presence of viral mutation(s) within the areas targeted by this assay, and inadequate number of viral copies (<250 copies / mL). A negative result must be combined with clinical observations, patient history, and epidemiological information.  Fact Sheet for Patients:   StrictlyIdeas.no  Fact Sheet for Healthcare Providers: BankingDealers.co.za  This test is not yet approved or  cleared by the [place] FDA and has been authorized for detection and/or diagnosis of SARS-CoV-2 by FDA under an Emergency Use Authorization (EUA).  This EUA will remain in effect (meaning this test can be used) for the duration of the COVID-19 declaration under Section 564(b)(1) of the Act, 21 U.S.C. section 360bbb-3(b)(1), unless the authorization is terminated or revoked sooner.  Performed at [facility], [place] [address], [place], [place] [address]     Radiology Studies: No results found.  Scheduled Meds:  acetaminophen  650 mg Oral Once   aspirin  81 mg Oral Daily   cyanocobalamin  1,000 mcg Intramuscular Q30 days   feeding supplement  237 mL Oral TID BM   ferrous sulfate  325 mg Oral BID WC   finasteride  5 mg Oral Daily   fluticasone furoate-vilanterol  1 puff Inhalation Daily   folic acid  1 mg Oral Daily   heparin  5,000 Units Subcutaneous Q8H   hydrocerin   Topical BID   metoprolol tartrate  12.5 mg Oral BID   multivitamin with minerals  1 tablet Oral Daily   nicotine  14 mg Transdermal Daily   pantoprazole  40 mg Oral Daily   phosphorus  250 mg Oral Daily   thiamine  100 mg Intravenous Daily   umeclidinium bromide  1 puff Inhalation Daily   Continuous Infusions:    LOS: 12 days    Time spent: 25 mins    Shawna Clamp, MD [facility]   If 7PM-7AM, please contact night-coverage

## 2020-07-31 NOTE — TOC Transition Note (Signed)
Transition of Care [facility]) - CM/SW Discharge Note   Patient Details  Name: Seth [name] MRN: [id number] Date of Birth: 12-22-1955  Transition of Care [facility]) CM/SW Contact:  Seth Castilla, LCSW Phone Number: [phone] 07/31/2020, 12:11 PM   Clinical Narrative:    Patient will DC to:?[place] date:?07/31/20 Family notified:?Seth [name] Transport [id number]   Per MD patient ready for DC to [facility] RN, patient, patient's family, and facility notified of DC. Discharge Summary sent to facility. RN given number for report  [phone] room 411(1). DC packet on chart. Ambulance transport requested for patient.   CSW signing off.   Seth [name], [place] [phone]    Final next level of care: Skilled Nursing Facility Barriers to Discharge: Barriers Resolved   Patient Goals and CMS Choice Patient states their goals for this hospitalization and ongoing recovery are:: to go to SNF CMS Medicare.gov Compare Post Acute Care list provided to:: Patient Choice offered to / list presented to : Patient  Discharge Placement              Patient chooses bed at: [facility] Patient to be transferred to facility by: [facility] Name of family member notified: Seth [name] Patient and family notified of of transfer: 07/31/20  Discharge Plan and Services                                     Social Determinants of Health ([place]) Interventions     Readmission Risk Interventions Readmission Risk Prevention Plan 06/04/2019  Transportation Screening Complete  PCP or Specialist Appt within 5-7 Days Complete  Home Care Screening Complete  Medication Review (RN CM) Complete  Some recent data might be hidden

## 2020-07-31 NOTE — TOC Progression Note (Signed)
Transition of Care [facility]) - Progression Note    Patient Details  Name: SHERMON BOZZI MRN: [id number] Date of Birth: 1956/06/13  Transition of Care [facility]) CM/SW [place], [place] Phone Number: [phone] 07/31/2020, 10:55 AM  Clinical Narrative:     CSW received a call from [facility] with [facility] and she stated that authorization was back. Ebony Hail informed CSW that a copy of patient's vaccination card must be in prior to patient coming. Ebony Hail followed up with patient's sister and she is trying to get a copy form [facility]. Ebony Hail also stated that an updated DC summary would be need with the correct discharge date.   TOC team will continue to assist with discharge planning needs.  Expected Discharge Plan: Skilled Nursing Facility Barriers to Discharge: Continued Medical Work up  Expected Discharge Plan and Services Expected Discharge Plan: [place] arrangements for the past 2 months: Single Family Home Expected Discharge Date: 07/30/20                                     Social Determinants of Health (SDOH) Interventions    Readmission Risk Interventions Readmission Risk Prevention Plan 06/04/2019  Transportation Screening Complete  PCP or Specialist Appt within 5-7 Days Complete  Home Care Screening Complete  Medication Review (RN CM) Complete  Some recent data might be hidden

## 2020-08-02 DIAGNOSIS — R296 Repeated falls: Secondary | ICD-10-CM | POA: Diagnosis not present

## 2020-08-02 DIAGNOSIS — Z7289 Other problems related to lifestyle: Secondary | ICD-10-CM | POA: Diagnosis not present

## 2020-08-02 DIAGNOSIS — E860 Dehydration: Secondary | ICD-10-CM | POA: Diagnosis not present

## 2020-08-02 DIAGNOSIS — J690 Pneumonitis due to inhalation of food and vomit: Secondary | ICD-10-CM | POA: Diagnosis not present

## 2020-08-02 DIAGNOSIS — I10 Essential (primary) hypertension: Secondary | ICD-10-CM | POA: Diagnosis not present

## 2020-08-02 DIAGNOSIS — R55 Syncope and collapse: Secondary | ICD-10-CM | POA: Diagnosis not present

## 2020-08-02 DIAGNOSIS — N179 Acute kidney failure, unspecified: Secondary | ICD-10-CM | POA: Diagnosis not present

## 2020-08-02 DIAGNOSIS — E872 Acidosis: Secondary | ICD-10-CM | POA: Diagnosis not present

## 2020-08-09 DIAGNOSIS — R296 Repeated falls: Secondary | ICD-10-CM | POA: Diagnosis not present

## 2020-08-09 DIAGNOSIS — E872 Acidosis: Secondary | ICD-10-CM | POA: Diagnosis not present

## 2020-08-09 DIAGNOSIS — R55 Syncope and collapse: Secondary | ICD-10-CM | POA: Diagnosis not present

## 2020-08-09 DIAGNOSIS — I10 Essential (primary) hypertension: Secondary | ICD-10-CM | POA: Diagnosis not present

## 2020-08-09 DIAGNOSIS — Z7289 Other problems related to lifestyle: Secondary | ICD-10-CM | POA: Diagnosis not present

## 2020-08-09 DIAGNOSIS — E860 Dehydration: Secondary | ICD-10-CM | POA: Diagnosis not present

## 2020-08-09 DIAGNOSIS — N179 Acute kidney failure, unspecified: Secondary | ICD-10-CM | POA: Diagnosis not present

## 2020-08-09 DIAGNOSIS — J690 Pneumonitis due to inhalation of food and vomit: Secondary | ICD-10-CM | POA: Diagnosis not present

## 2020-08-19 ENCOUNTER — Other Ambulatory Visit: Payer: Self-pay

## 2020-08-19 NOTE — Patient Outreach (Signed)
[facility]) Care Management  08/19/2020  Seth [name] July 30, 1956 [id number]   EMMI- General Discharge RED ON EMMI ALERT Day # 4 Date: 08/17/20 Red Alert Reason: Lost interest in things? Yes   Outreach attempt: No answer.  HIPAA compliant voice message left.   Plan: RN CM will attempt patient again within 4 business days and send letter.  Seth Baseman, RN, MSN [facility] Care Management Care Management Coordinator Direct Line [phone] Toll Free: [phone]  Fax: [phone]

## 2020-08-23 ENCOUNTER — Other Ambulatory Visit: Payer: Self-pay

## 2020-08-23 NOTE — Patient Outreach (Signed)
[facility]) Care Management  08/23/2020  Seth [name] [birth year] [id number]   EMMI- General Discharge RED ON EMMI ALERT Day # 4 Date: 1221/21 Red Alert Reason: Lost interest I things? Yes  Outreach attempt: spoke with patient. He states he is making it.  He states that he will be calling to make another appointment with his PCP as he just could not get there on time.  Addressed red alert.  He states he sometimes gets down about not getting better as fast as he wants to but otherwise he is ok.  PHQ2= 1.  Patient acknowledges being part of the [employer] and having a nurse to call.    Plan: RN CM will close case.    Jone Baseman, RN, MSN [facility] Care Management Care Management Coordinator Direct Line [phone] Toll Free: [phone]  Fax: [phone]

## 2020-08-26 ENCOUNTER — Ambulatory Visit: Payer: Medicare HMO

## 2020-09-14 ENCOUNTER — Emergency Department (HOSPITAL_COMMUNITY): Payer: Medicare (Managed Care)

## 2020-09-14 ENCOUNTER — Other Ambulatory Visit: Payer: Self-pay

## 2020-09-14 ENCOUNTER — Encounter (HOSPITAL_COMMUNITY): Payer: Self-pay | Admitting: Emergency Medicine

## 2020-09-14 ENCOUNTER — Inpatient Hospital Stay (HOSPITAL_COMMUNITY)
Admission: EM | Admit: 2020-09-14 | Discharge: 2020-09-27 | DRG: 853 | Disposition: A | Discharge status: Home Health | Payer: Medicare (Managed Care) | Attending: Internal Medicine | Admitting: Internal Medicine

## 2020-09-14 DIAGNOSIS — E871 Hypo-osmolality and hyponatremia: Secondary | ICD-10-CM | POA: Diagnosis not present

## 2020-09-14 DIAGNOSIS — M869 Osteomyelitis, unspecified: Secondary | ICD-10-CM | POA: Diagnosis present

## 2020-09-14 DIAGNOSIS — Z79899 Other long term (current) drug therapy: Secondary | ICD-10-CM | POA: Diagnosis not present

## 2020-09-14 DIAGNOSIS — F1020 Alcohol dependence, uncomplicated: Secondary | ICD-10-CM | POA: Diagnosis present

## 2020-09-14 DIAGNOSIS — F1090 Alcohol use, unspecified, uncomplicated: Secondary | ICD-10-CM | POA: Diagnosis present

## 2020-09-14 DIAGNOSIS — Z7982 Long term (current) use of aspirin: Secondary | ICD-10-CM | POA: Diagnosis not present

## 2020-09-14 DIAGNOSIS — M86172 Other acute osteomyelitis, left ankle and foot: Secondary | ICD-10-CM | POA: Diagnosis not present

## 2020-09-14 DIAGNOSIS — D649 Anemia, unspecified: Secondary | ICD-10-CM | POA: Diagnosis present

## 2020-09-14 DIAGNOSIS — G9341 Metabolic encephalopathy: Secondary | ICD-10-CM | POA: Diagnosis present

## 2020-09-14 DIAGNOSIS — F17210 Nicotine dependence, cigarettes, uncomplicated: Secondary | ICD-10-CM | POA: Diagnosis present

## 2020-09-14 DIAGNOSIS — R6520 Severe sepsis without septic shock: Secondary | ICD-10-CM | POA: Diagnosis present

## 2020-09-14 DIAGNOSIS — L0390 Cellulitis, unspecified: Secondary | ICD-10-CM | POA: Diagnosis not present

## 2020-09-14 DIAGNOSIS — I10 Essential (primary) hypertension: Secondary | ICD-10-CM | POA: Diagnosis present

## 2020-09-14 DIAGNOSIS — Z7951 Long term (current) use of inhaled steroids: Secondary | ICD-10-CM | POA: Diagnosis not present

## 2020-09-14 DIAGNOSIS — J449 Chronic obstructive pulmonary disease, unspecified: Secondary | ICD-10-CM | POA: Diagnosis present

## 2020-09-14 DIAGNOSIS — Z981 Arthrodesis status: Secondary | ICD-10-CM

## 2020-09-14 DIAGNOSIS — U071 COVID-19: Secondary | ICD-10-CM | POA: Diagnosis present

## 2020-09-14 DIAGNOSIS — Z8673 Personal history of transient ischemic attack (TIA), and cerebral infarction without residual deficits: Secondary | ICD-10-CM

## 2020-09-14 DIAGNOSIS — L03116 Cellulitis of left lower limb: Secondary | ICD-10-CM | POA: Diagnosis present

## 2020-09-14 DIAGNOSIS — Z8249 Family history of ischemic heart disease and other diseases of the circulatory system: Secondary | ICD-10-CM

## 2020-09-14 DIAGNOSIS — I70262 Atherosclerosis of native arteries of extremities with gangrene, left leg: Secondary | ICD-10-CM | POA: Diagnosis present

## 2020-09-14 DIAGNOSIS — E876 Hypokalemia: Secondary | ICD-10-CM | POA: Diagnosis not present

## 2020-09-14 DIAGNOSIS — M86072 Acute hematogenous osteomyelitis, left ankle and foot: Secondary | ICD-10-CM | POA: Diagnosis not present

## 2020-09-14 DIAGNOSIS — L97529 Non-pressure chronic ulcer of other part of left foot with unspecified severity: Secondary | ICD-10-CM | POA: Diagnosis present

## 2020-09-14 DIAGNOSIS — A419 Sepsis, unspecified organism: Secondary | ICD-10-CM | POA: Diagnosis present

## 2020-09-14 DIAGNOSIS — R059 Cough, unspecified: Secondary | ICD-10-CM

## 2020-09-14 LAB — CBC WITH DIFFERENTIAL/PLATELET
Abs Immature Granulocytes: 0.1 10*3/uL — ABNORMAL HIGH (ref 0.00–0.07)
Basophils Absolute: 0 10*3/uL (ref 0.0–0.1)
Basophils Relative: 0 %
Eosinophils Absolute: 0.1 10*3/uL (ref 0.0–0.5)
Eosinophils Relative: 0 %
HCT: 32.9 % — ABNORMAL LOW (ref 39.0–52.0)
Hemoglobin: 10 g/dL — ABNORMAL LOW (ref 13.0–17.0)
Immature Granulocytes: 1 %
Lymphocytes Relative: 11 %
Lymphs Abs: 1.5 10*3/uL (ref 0.7–4.0)
MCH: 29.2 pg (ref 26.0–34.0)
MCHC: 30.4 g/dL (ref 30.0–36.0)
MCV: 96.2 fL (ref 80.0–100.0)
Monocytes Absolute: 1.6 10*3/uL — ABNORMAL HIGH (ref 0.1–1.0)
Monocytes Relative: 12 %
Neutro Abs: 10.5 10*3/uL — ABNORMAL HIGH (ref 1.7–7.7)
Neutrophils Relative %: 76 %
Platelets: 507 10*3/uL — ABNORMAL HIGH (ref 150–400)
RBC: 3.42 MIL/uL — ABNORMAL LOW (ref 4.22–5.81)
RDW: 21.3 % — ABNORMAL HIGH (ref 11.5–15.5)
WBC: 13.7 10*3/uL — ABNORMAL HIGH (ref 4.0–10.5)
nRBC: 0 % (ref 0.0–0.2)

## 2020-09-14 LAB — COMPREHENSIVE METABOLIC PANEL
ALT: 17 U/L (ref 0–44)
AST: 23 U/L (ref 15–41)
Albumin: 2.8 g/dL — ABNORMAL LOW (ref 3.5–5.0)
Alkaline Phosphatase: 95 U/L (ref 38–126)
Anion gap: 13 (ref 5–15)
BUN: 9 mg/dL (ref 8–23)
CO2: 24 mmol/L (ref 22–32)
Calcium: 9.9 mg/dL (ref 8.9–10.3)
Chloride: 98 mmol/L (ref 98–111)
Creatinine, Ser: 1.19 mg/dL (ref 0.61–1.24)
GFR, Estimated: >60 mL/min (ref >60)
Glucose, Bld: 104 mg/dL — ABNORMAL HIGH (ref 70–99)
Potassium: 5 mmol/L (ref 3.5–5.1)
Sodium: 135 mmol/L (ref 135–145)
Total Bilirubin: 0.9 mg/dL (ref 0.3–1.2)
Total Protein: 7.1 g/dL (ref 6.5–8.1)

## 2020-09-14 LAB — ETHANOL: Alcohol, Ethyl (B): <10 mg/dL (ref <10)

## 2020-09-14 LAB — URINALYSIS, ROUTINE W REFLEX MICROSCOPIC
Bacteria, UA: NONE SEEN
Bilirubin Urine: NEGATIVE
Glucose, UA: NEGATIVE mg/dL
Hgb urine dipstick: NEGATIVE
Ketones, ur: 5 mg/dL — AB
Leukocytes,Ua: NEGATIVE
Nitrite: NEGATIVE
Protein, ur: 30 mg/dL — AB
Specific Gravity, Urine: 1.019 (ref 1.005–1.030)
pH: 7 (ref 5.0–8.0)

## 2020-09-14 LAB — RESP PANEL BY RT-PCR (FLU A&B, COVID) ARPGX2
Influenza A by PCR: NEGATIVE
Influenza B by PCR: NEGATIVE
SARS Coronavirus 2 by RT PCR: POSITIVE — AB

## 2020-09-14 LAB — LACTIC ACID, PLASMA
Lactic Acid, Venous: 2 mmol/L (ref 0.5–1.9)
Lactic Acid, Venous: 1.9 mmol/L (ref 0.5–1.9)

## 2020-09-14 LAB — PROTIME-INR
INR: 1.1 (ref 0.8–1.2)
Prothrombin Time: 13.4 seconds (ref 11.4–15.2)

## 2020-09-14 LAB — APTT: aPTT: 23 seconds — ABNORMAL LOW (ref 24–36)

## 2020-09-14 IMAGING — CT CT HEAD W/O CM
3 series · 15 of 47 positions shown, 18 images · non-contrast
Comparison: Prior head CT examinations [DATE] and earlier.

CLINICAL DATA: Delirium.

EXAM:
CT HEAD WITHOUT CONTRAST
TECHNIQUE: Contiguous axial images were obtained from the base of the skull
through the vertex without intravenous contrast.

[Series 4: head 5.0 h30s · axial · 0.42mm/px · z∈[-215,-80]mm · 9 of 33 slices shown, 12 images]
[im 3/33  brain]
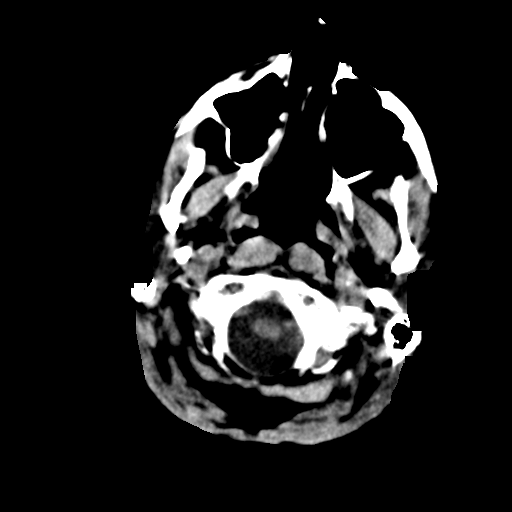
[im 3/33  bone]
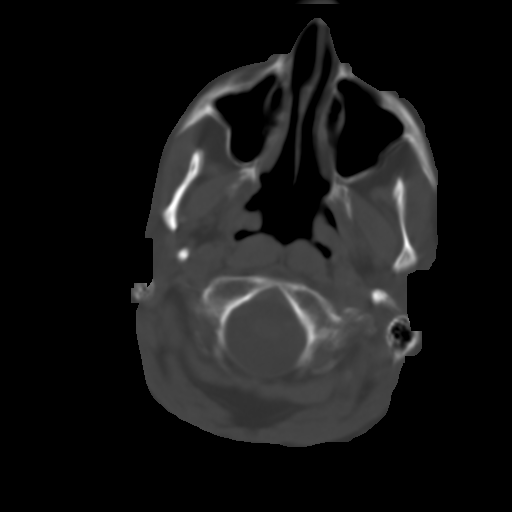
[im 6/33  brain]
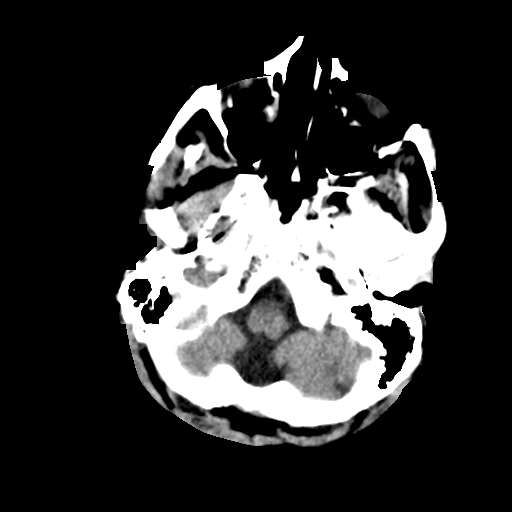
[im 9/33  brain]
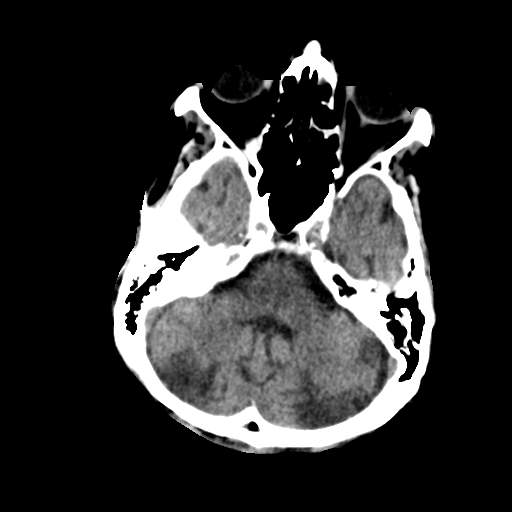
[im 13/33  brain]
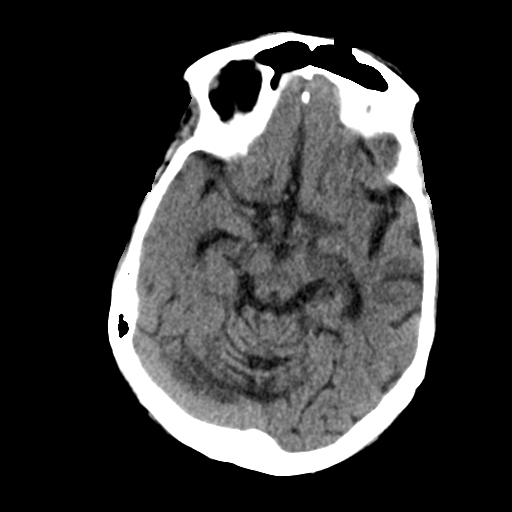
[im 17/33  brain]
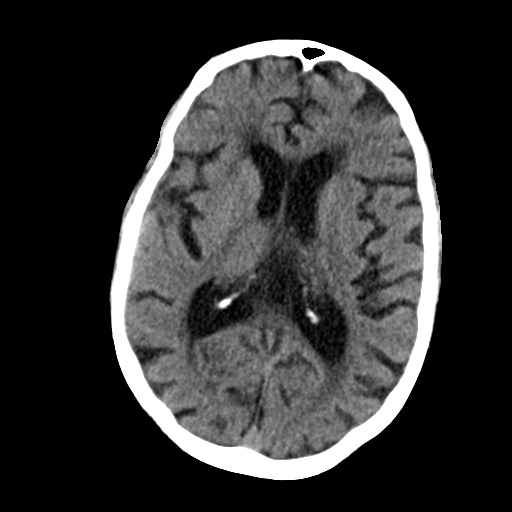
[im 17/33  bone]
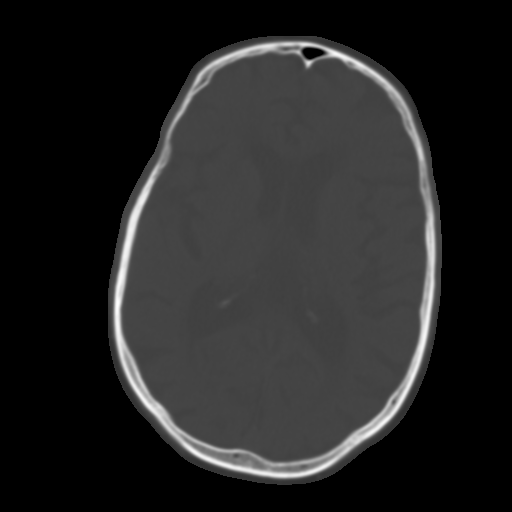
[im 20/33  brain]
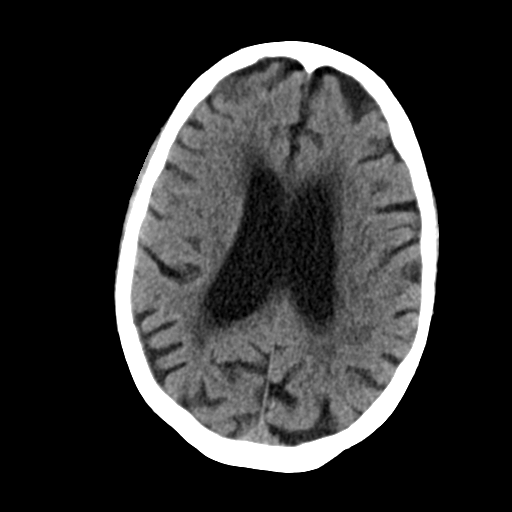
[im 24/33  brain]
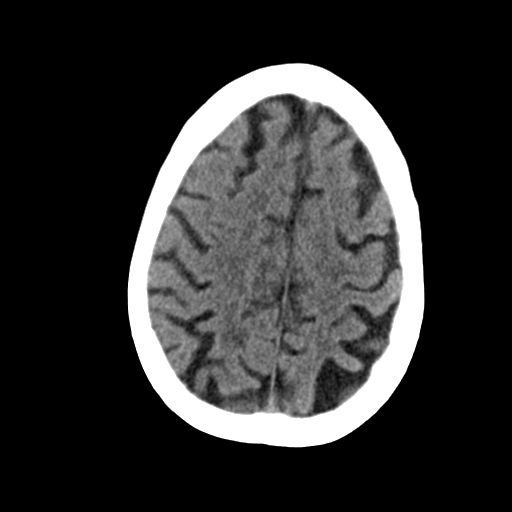
[im 27/33  brain]
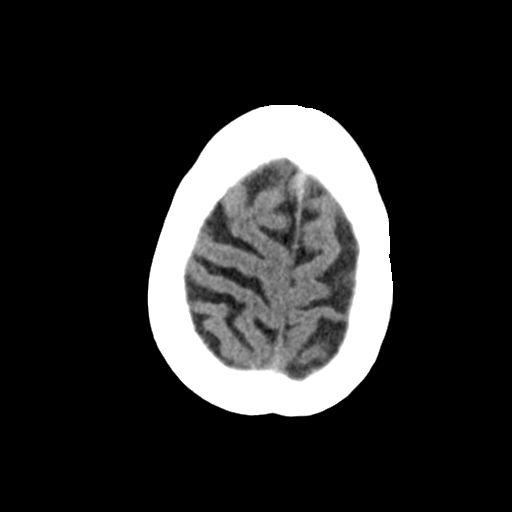
[im 30/33  brain]
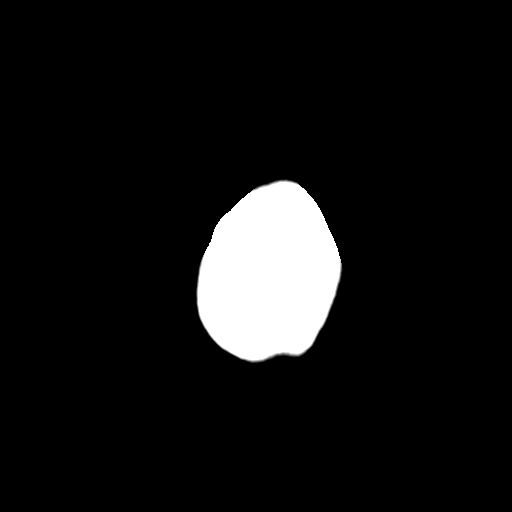
[im 30/33  bone]
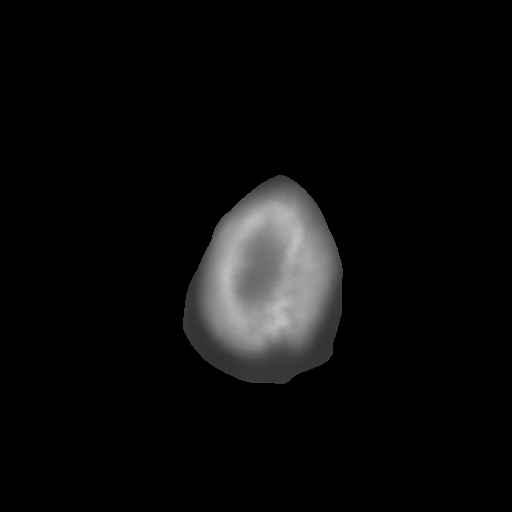

[Series 5: head 3.0 mpr cor · coronal · 0.31mm/px · 3 of 71 slices shown]
[im 24/71  brain]
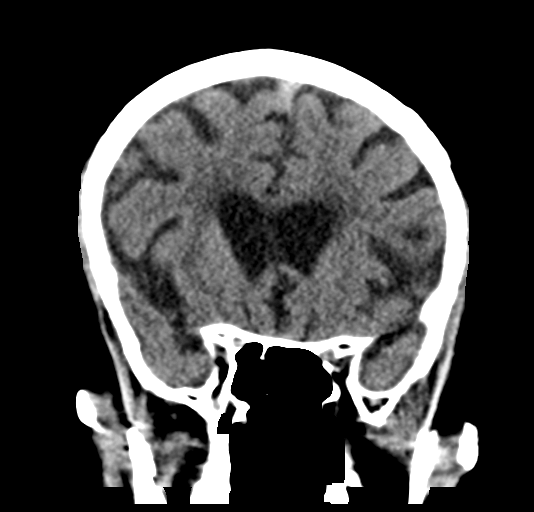
[im 32/71  brain]
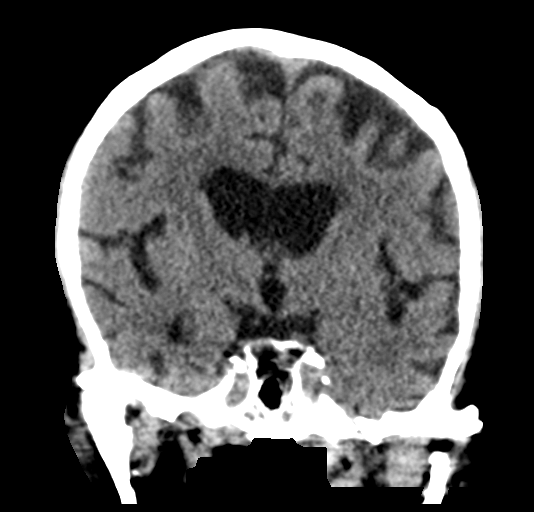
[im 39/71  brain]
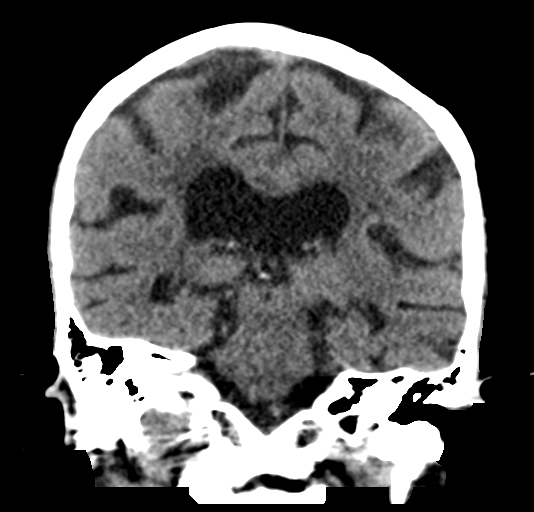

[Series 6: head 3.0 mpr sag · sagittal · 0.31mm/px · 3 of 55 slices shown]
[im 19/55  brain]
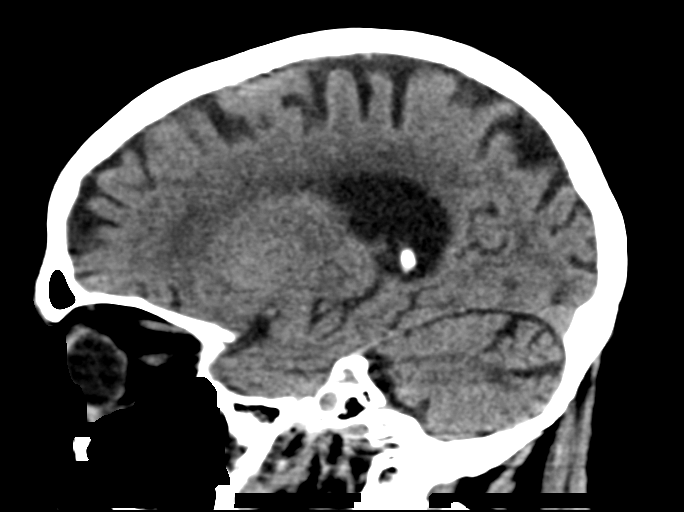
[im 28/55  brain]
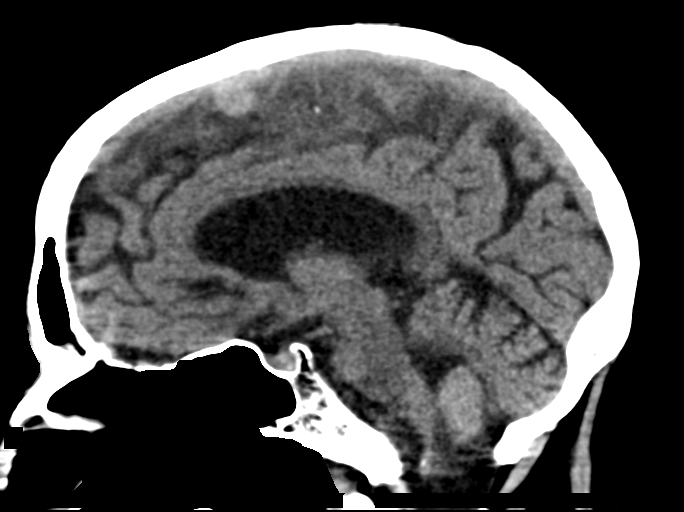
[im 37/55  brain]
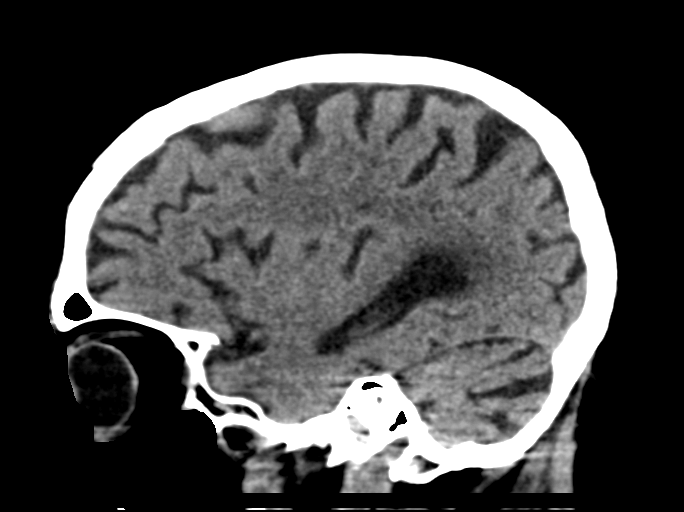

[15 of 47 positions shown; findings below may reference images not displayed]

FINDINGS: Brain:

Mildly motion degraded exam.

Mild cerebral and cerebellar atrophy.

1.0 x 0.5 cm lobular mass along the right aspect of the falx,
overlying the right frontal lobe (series 4, image 26). In retrospect
this finding was present on the prior examination of [DATE] and
is compatible with a small incidental meningioma.

Mild ill-defined hypoattenuation within the cerebral white matter is
nonspecific, but compatible with chronic small vessel ischemic
disease.

Redemonstrated chronic infarcts within the bilateral cerebellar
hemispheres.

There is no acute intracranial hemorrhage.

No demarcated cortical infarct.

No extra-axial fluid collection.

No midline shift.

Vascular: No hyperdense vessel.  Atherosclerotic calcifications

Skull: Normal. Negative for fracture or focal lesion.

Sinuses/Orbits: Visualized orbits show no acute finding.
Redemonstrated 10 mm polyp versus mucous retention cyst within the
superomedial right maxillary sinus. Mild frothy secretions within
the right maxillary sinus. Frothy secretions within the right
sphenoid sinus. Frothy secretions within a posterior right ethmoid
air cell. Background moderate bilateral ethmoid sinus mucosal
thickening. Mild mucosal thickening within the left frontal sinus.
IMPRESSION: Mildly motion degraded exam.

No evidence of acute intracranial abnormality.

Incidental 1 cm right parafalcine meningioma, unchanged from the
head CT of [DATE].

Stable mild cerebral atrophy and chronic small vessel ischemic
disease.

Redemonstrated chronic infarcts within the bilateral cerebellar
hemispheres.

Paranasal sinus disease as described. Correlate for acute on chronic
sinusitis.

## 2020-09-14 IMAGING — DX DG CHEST 1V PORT
1 series · 1 of 1 positions shown · non-contrast
Comparison: [DATE].  [DATE].  [DATE].

CLINICAL DATA: Cough.  Fever.

EXAM:
PORTABLE CHEST 1 VIEW

[chest]
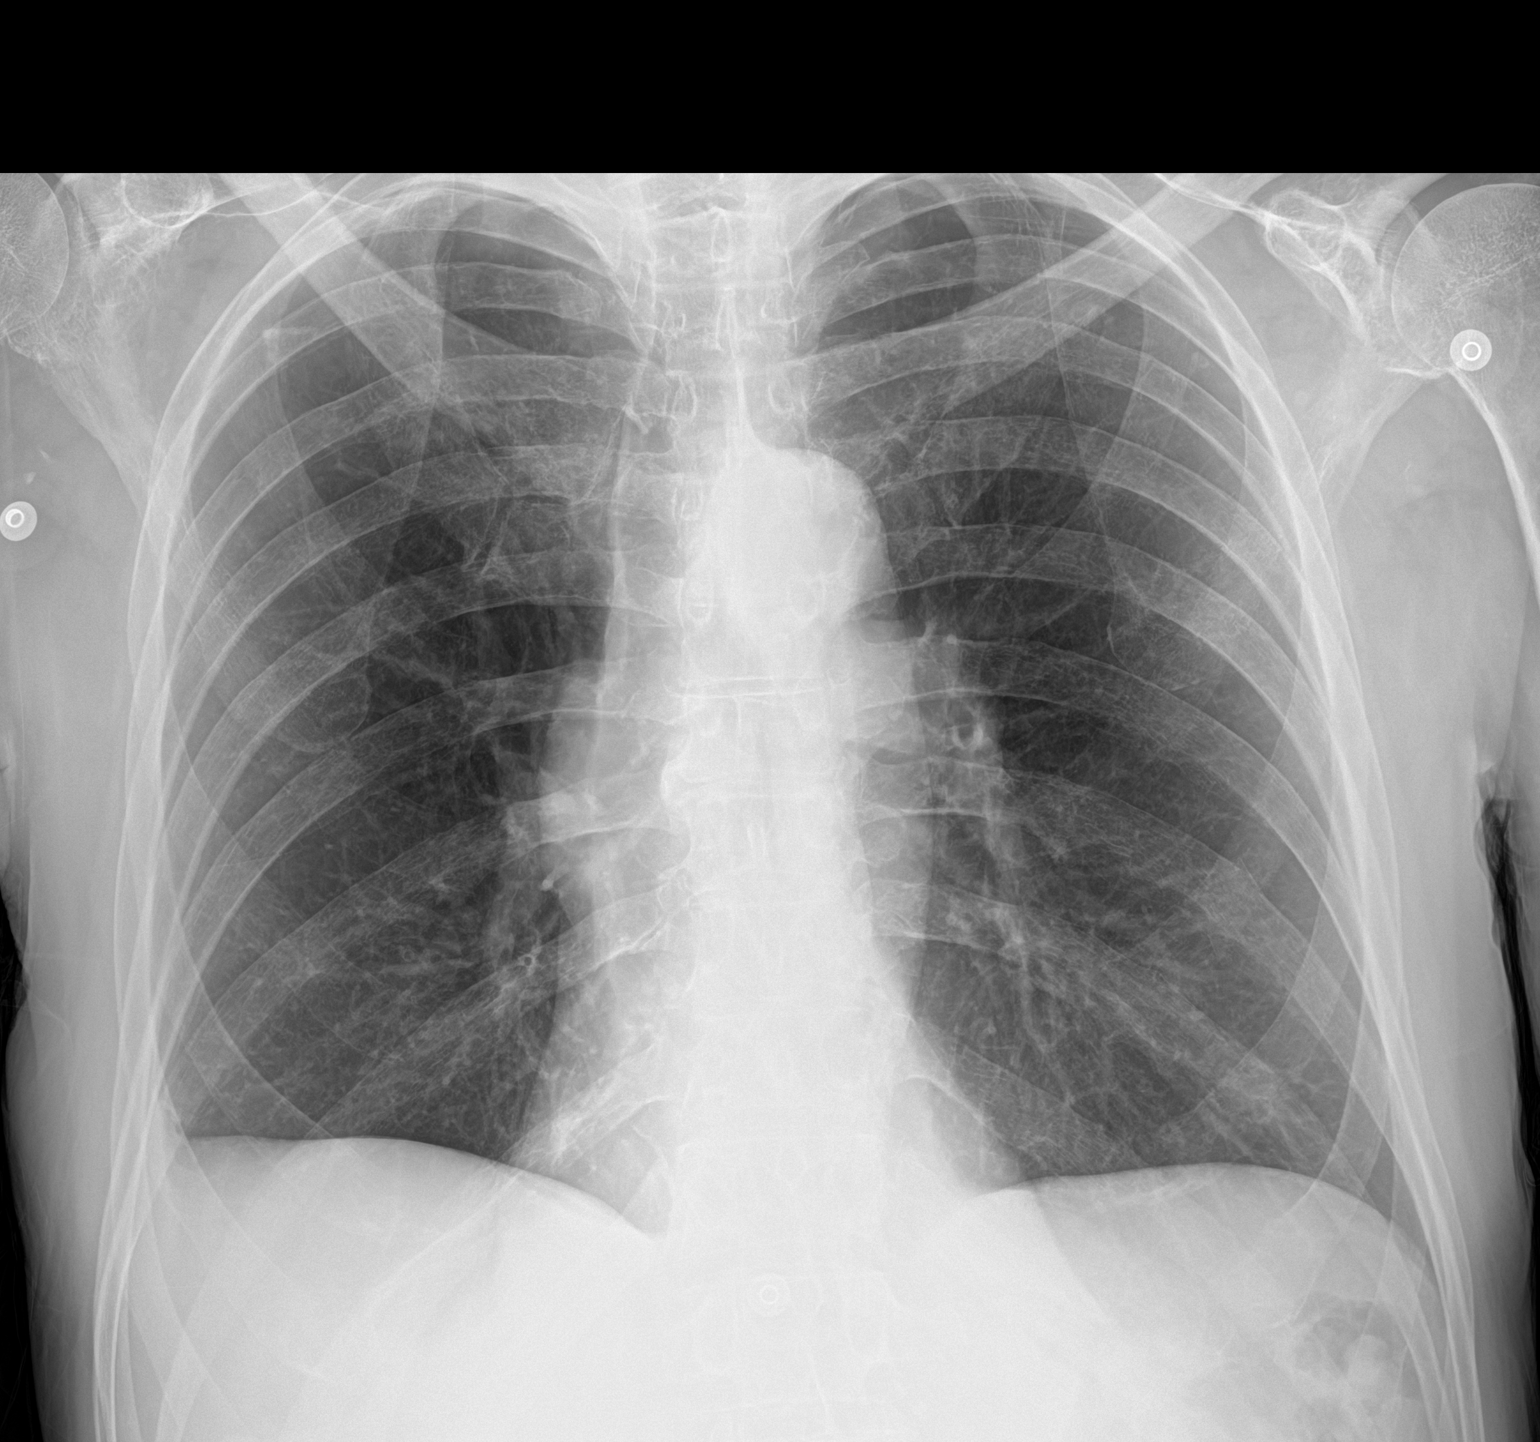

[1 of 1 positions shown; findings below may reference images not displayed]

FINDINGS: Mediastinum and hilar structures are stable. Heart size normal. No
focal infiltrate. Stable right base pleural thickening consistent
with scarring. No pneumothorax.
IMPRESSION: No acute cardiopulmonary disease.

## 2020-09-14 IMAGING — DX DG FOOT COMPLETE 3+V*L*
2 series · 3 of 3 positions shown · non-contrast
Comparison: None.

CLINICAL DATA: Multiple chronic wounds

EXAM:
LEFT FOOT - COMPLETE 3+ VIEW

[Series 1: foot · 0.14mm/px · 2 of 2 slices shown]
[im 1/2]
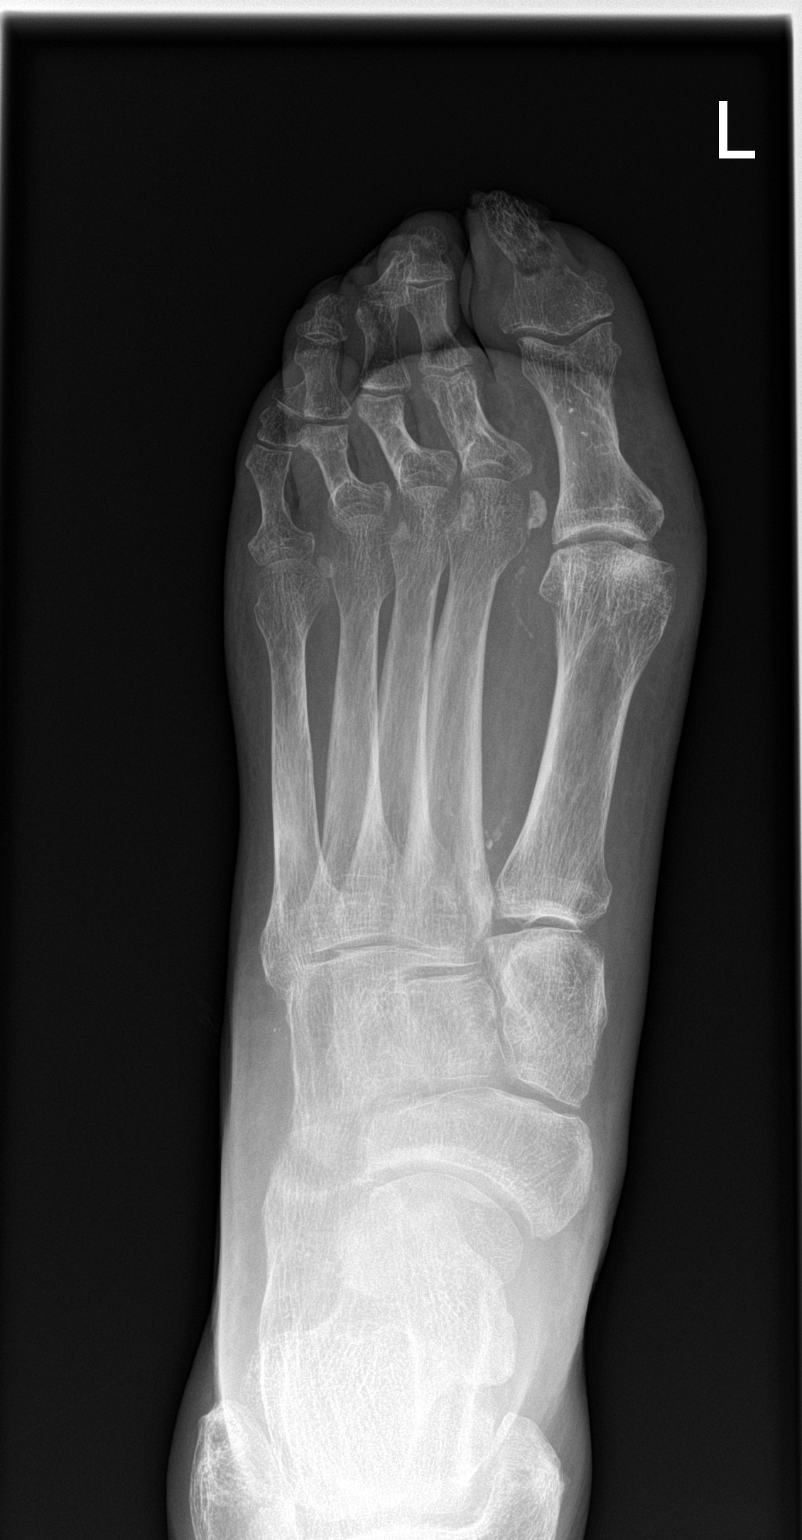
[im 2/2]
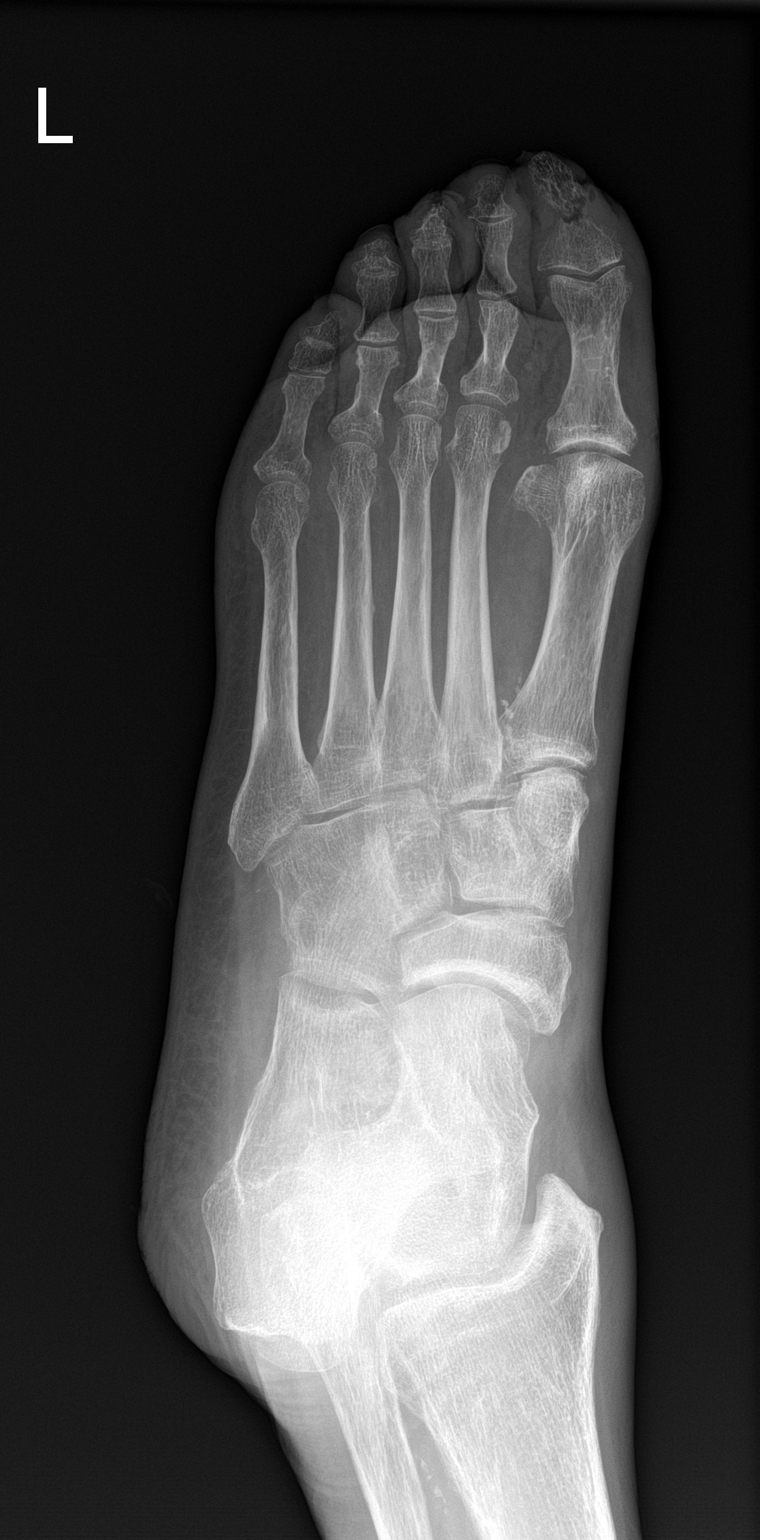

[leg]
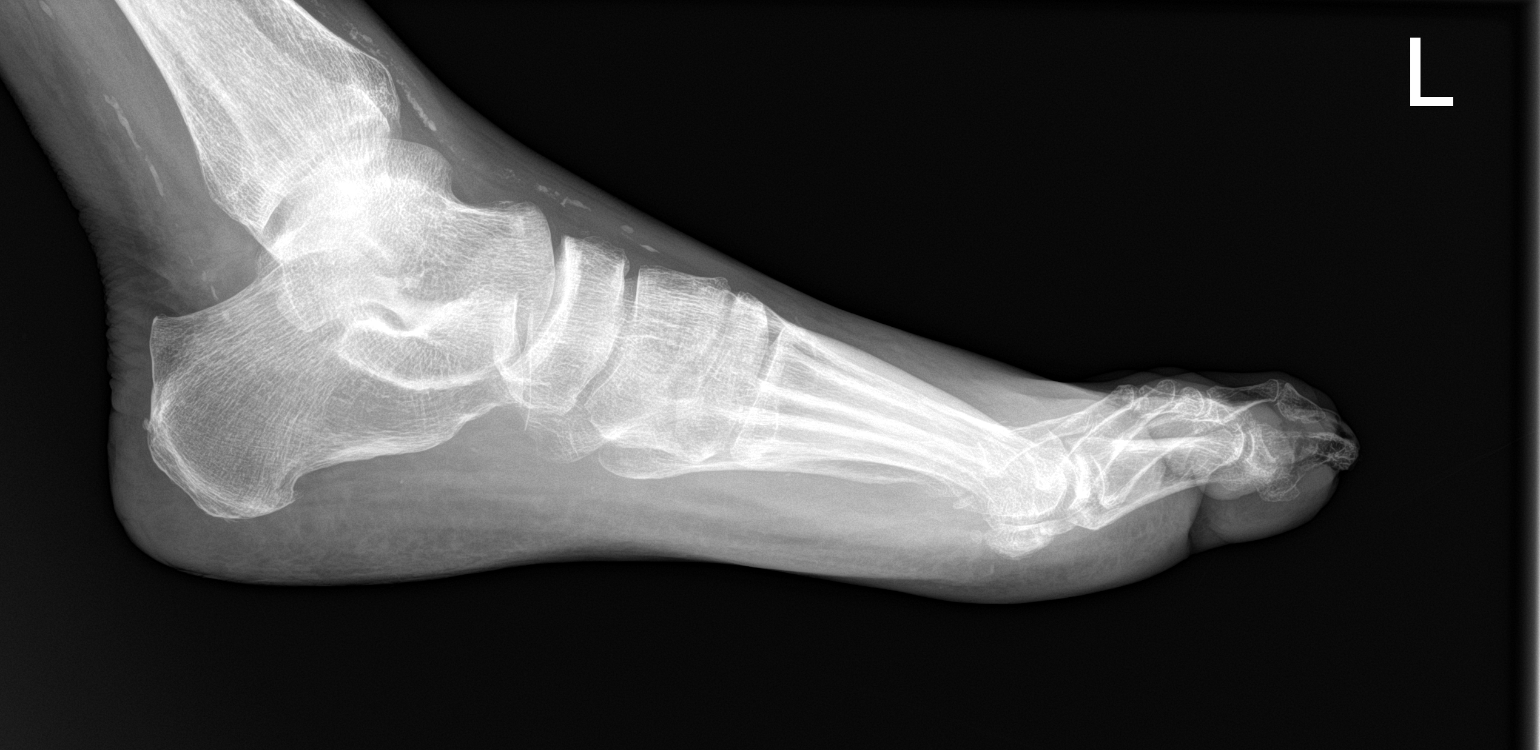

[3 of 3 positions shown; findings below may reference images not displayed]

FINDINGS: Large ulcer or wound at the distal first digit. Bony destructive
change involving midportion of first distal phalanx with probable
pathologic fracture. Small erosions at the base of the first distal
phalanx and head of the proximal phalanx on the medial side.
Possible wound or ulcer at the tip of the third distal digit with
mild erosion of the tuft of the distal phalanx third digit. No
radiopaque foreign body. Vascular calcifications. Degenerative
changes at the first MTP joint
IMPRESSION: 1. Large ulcer or wound at the distal first digit with bony
destructive change/suspected osteomyelitis involving the distal
phalanx with probable pathologic fracture through the mid segment.
2. Small erosions at the first distal phalanx and head of the first
proximal phalanx, suspect for osteomyelitis.
3. Possible wound or ulcer at the tip of the third distal digit with
possible erosion of the tuft of the distal phalanx, question
osteomyelitis.

## 2020-09-14 MED ORDER — LORAZEPAM 1 MG PO TABS: 0.0000 mg | ORAL_TABLET | Freq: Four times a day (QID) | ORAL | Status: AC

## 2020-09-14 MED ORDER — VANCOMYCIN HCL 1500 MG/300ML IV SOLN
1500.0000 mg | Freq: Once | INTRAVENOUS | Status: AC
  Administered 2020-09-14: 1500 mg via INTRAVENOUS
  Filled 2020-09-14: qty 300

## 2020-09-14 MED ORDER — SODIUM CHLORIDE 0.9 % IV SOLN: 2.0000 g | Freq: Once | INTRAVENOUS | Status: DC

## 2020-09-14 MED ORDER — LACTATED RINGERS IV SOLN: INTRAVENOUS | Status: AC

## 2020-09-14 MED ORDER — SODIUM CHLORIDE 0.9 % IV SOLN
2.0000 g | Freq: Two times a day (BID) | INTRAVENOUS | Status: DC
  Administered 2020-09-14 – 2020-09-16 (×4): 2 g via INTRAVENOUS
  Filled 2020-09-14 (×4): qty 2

## 2020-09-14 MED ORDER — FOLIC ACID 1 MG PO TABS
1.0000 mg | ORAL_TABLET | Freq: Every day | ORAL | Status: DC
  Administered 2020-09-15 – 2020-09-27 (×13): 1 mg via ORAL
  Filled 2020-09-14 (×13): qty 1

## 2020-09-14 MED ORDER — VANCOMYCIN HCL IN DEXTROSE 1-5 GM/200ML-% IV SOLN
1000.0000 mg | Freq: Once | INTRAVENOUS | Status: DC
Start: 2020-09-14 — End: 2020-09-14

## 2020-09-14 MED ORDER — LACTATED RINGERS IV BOLUS (SEPSIS)
1000.0000 mL | Freq: Once | INTRAVENOUS | Status: AC
  Administered 2020-09-14: 1000 mL via INTRAVENOUS

## 2020-09-14 MED ORDER — METRONIDAZOLE IN NACL 5-0.79 MG/ML-% IV SOLN
500.0000 mg | Freq: Three times a day (TID) | INTRAVENOUS | Status: DC
  Administered 2020-09-14 – 2020-09-16 (×5): 500 mg via INTRAVENOUS
  Filled 2020-09-14 (×5): qty 100

## 2020-09-14 MED ORDER — SODIUM CHLORIDE 0.9 % IV SOLN
2.0000 g | Freq: Once | INTRAVENOUS | Status: AC
  Administered 2020-09-14: 2 g via INTRAVENOUS
  Filled 2020-09-14: qty 20

## 2020-09-14 MED ORDER — THIAMINE HCL 100 MG PO TABS
100.0000 mg | ORAL_TABLET | Freq: Every day | ORAL | Status: DC
  Administered 2020-09-15 – 2020-09-27 (×13): 100 mg via ORAL
  Filled 2020-09-14 (×14): qty 1

## 2020-09-14 MED ORDER — VANCOMYCIN HCL 1250 MG/250ML IV SOLN
1250.0000 mg | INTRAVENOUS | Status: DC
  Administered 2020-09-15 – 2020-09-17 (×3): 1250 mg via INTRAVENOUS
  Filled 2020-09-14 (×4): qty 250

## 2020-09-14 MED ORDER — ACETAMINOPHEN 325 MG PO TABS
650.0000 mg | ORAL_TABLET | Freq: Four times a day (QID) | ORAL | Status: DC | PRN
  Administered 2020-09-15: 650 mg via ORAL
  Filled 2020-09-14 (×2): qty 2

## 2020-09-14 MED ORDER — HEPARIN SODIUM (PORCINE) 5000 UNIT/ML IJ SOLN
5000.0000 [IU] | Freq: Three times a day (TID) | INTRAMUSCULAR | Status: DC
  Administered 2020-09-15 – 2020-09-17 (×7): 5000 [IU] via SUBCUTANEOUS
  Filled 2020-09-14 (×6): qty 1

## 2020-09-14 MED ORDER — ADULT MULTIVITAMIN W/MINERALS CH
1.0000 | ORAL_TABLET | Freq: Every day | ORAL | Status: DC
  Administered 2020-09-15 – 2020-09-27 (×13): 1 via ORAL
  Filled 2020-09-14 (×13): qty 1

## 2020-09-14 MED ORDER — ACETAMINOPHEN 650 MG RE SUPP: 650.0000 mg | Freq: Four times a day (QID) | RECTAL | Status: DC | PRN

## 2020-09-14 MED ORDER — THIAMINE HCL 100 MG/ML IJ SOLN
100.0000 mg | Freq: Every day | INTRAMUSCULAR | Status: DC
  Administered 2020-09-14: 100 mg via INTRAVENOUS
  Filled 2020-09-14: qty 2

## 2020-09-14 MED ORDER — VANCOMYCIN HCL 1250 MG/250ML IV SOLN: 1250.0000 mg | INTRAVENOUS | Status: DC

## 2020-09-14 MED ORDER — VANCOMYCIN HCL IN DEXTROSE 1-5 GM/200ML-% IV SOLN
1000.0000 mg | Freq: Once | INTRAVENOUS | Status: DC
  Filled 2020-09-14: qty 200

## 2020-09-14 MED ORDER — LORAZEPAM 2 MG/ML IJ SOLN: 0.0000 mg | Freq: Four times a day (QID) | INTRAMUSCULAR | Status: DC

## 2020-09-14 MED ORDER — LORAZEPAM 1 MG PO TABS
1.0000 mg | ORAL_TABLET | ORAL | Status: AC | PRN
  Filled 2020-09-14: qty 2

## 2020-09-14 MED ORDER — LORAZEPAM 1 MG PO TABS: 0.0000 mg | ORAL_TABLET | Freq: Two times a day (BID) | ORAL | Status: AC

## 2020-09-14 MED ORDER — THIAMINE HCL 100 MG/ML IJ SOLN
100.0000 mg | Freq: Every day | INTRAMUSCULAR | Status: DC
  Filled 2020-09-14 (×4): qty 2

## 2020-09-14 MED ORDER — LORAZEPAM 1 MG PO TABS: 0.0000 mg | ORAL_TABLET | Freq: Two times a day (BID) | ORAL | Status: DC

## 2020-09-14 MED ORDER — THIAMINE HCL 100 MG PO TABS
100.0000 mg | ORAL_TABLET | Freq: Every day | ORAL | Status: DC
Start: 2020-09-14 — End: 2020-09-14

## 2020-09-14 MED ORDER — LACTATED RINGERS IV SOLN: INTRAVENOUS | Status: DC

## 2020-09-14 MED ORDER — LORAZEPAM 2 MG/ML IJ SOLN: 0.0000 mg | Freq: Two times a day (BID) | INTRAMUSCULAR | Status: DC

## 2020-09-14 MED ORDER — VANCOMYCIN HCL 1500 MG/300ML IV SOLN: 1500.0000 mg | INTRAVENOUS | Status: DC

## 2020-09-14 MED ORDER — LORAZEPAM 2 MG/ML IJ SOLN: 1.0000 mg | INTRAMUSCULAR | Status: AC | PRN

## 2020-09-14 MED ORDER — LORAZEPAM 1 MG PO TABS: 0.0000 mg | ORAL_TABLET | Freq: Four times a day (QID) | ORAL | Status: DC
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
## 2020-09-14 NOTE — Sepsis Progress Note (Signed)
Verified with RN time of rocephin dose, given before second blood culture was drawn.  However, there is an order not to delay antibiotics if blood culture not obtained.
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
## 2020-09-14 NOTE — Sepsis Progress Note (Signed)
Sepsis protocol being followed by [facility].  Notified bedside nurse of need to administer antibiotics.

## 2020-09-14 NOTE — ED Triage Notes (Signed)
Arrived via EMS with altered mental status. Patient was at a store called a taxi and store clerk and taxi driver called ems for patient confused. Patient alert knows name only and able to follow commands. Temp with EMS 102.0 Temporal. During triage patient has a non productive wet cough.

## 2020-09-14 NOTE — ED Notes (Signed)
Rn unable to get blood work due to limited access. Phlebotomy has been consulted.

## 2020-09-14 NOTE — Progress Notes (Addendum)
Pharmacy Antibiotic Note  Seth [name] is a 65 y.o. male admitted on 09/14/2020 with sepsis secondary to wound on left foot. Pharmacy has been consulted for vancomcyin dosing. PMH significant for HTN, BPH, gout, COPD, hx CVA, depression, alcohol dependence, tobacco use.  WBC 13.7, Tmax 102. Lactic acid 2.0. Scr stable at 1.19.  Plan: Vancomycin 1500 mg x1 then 1250 mg q24h - Predicted AUC 528, Scr 1.19 - Goal AUC 400-550  Monitor cultures, renal function, labs, clinical progression  Height: 6' (182.9 cm) Weight: 63.5 kg (140 lb) IBW/kg (Calculated) : 77.6  Temp (24hrs), Avg:98.6 F (37 C), Min:98.4 F (36.9 C), Max:98.7 F (37.1 C)  Recent Labs  Lab 09/14/20 1508 09/14/20 1530  WBC 13.7*  --   CREATININE 1.19  --   LATICACIDVEN  --  2.0*    Estimated Creatinine Clearance: 57.1 mL/min (by C-G formula based on SCr of 1.19 mg/dL).    No Known Allergies  Antimicrobials this admission: Ceftriaxone 1/18 x1 Vancomcyin 1/18 >>   Dose adjustments this admission:  Microbiology results: 1/18 BCx: sent 1/18 UCx: sent  1/18 UA: sent 1/18 Respiratory PCR: COVID positive  Thank you for allowing pharmacy to be a part of this patient's care.  Fara Olden, PharmD PGY-1 Pharmacy Resident 09/14/2020 6:39 PM Please see AMION for all pharmacy numbers

## 2020-09-15 ENCOUNTER — Inpatient Hospital Stay (HOSPITAL_COMMUNITY): Payer: Medicare (Managed Care)

## 2020-09-15 DIAGNOSIS — F1020 Alcohol dependence, uncomplicated: Secondary | ICD-10-CM | POA: Diagnosis not present

## 2020-09-15 DIAGNOSIS — A419 Sepsis, unspecified organism: Secondary | ICD-10-CM | POA: Diagnosis not present

## 2020-09-15 LAB — CBC WITH DIFFERENTIAL/PLATELET
Abs Immature Granulocytes: 0.08 10*3/uL — ABNORMAL HIGH (ref 0.00–0.07)
Basophils Absolute: 0 10*3/uL (ref 0.0–0.1)
Basophils Relative: 0 %
Eosinophils Absolute: 0.1 10*3/uL (ref 0.0–0.5)
Eosinophils Relative: 1 %
HCT: 26.8 % — ABNORMAL LOW (ref 39.0–52.0)
Hemoglobin: 8.5 g/dL — ABNORMAL LOW (ref 13.0–17.0)
Immature Granulocytes: 1 %
Lymphocytes Relative: 12 %
Lymphs Abs: 1.5 10*3/uL (ref 0.7–4.0)
MCH: 30.4 pg (ref 26.0–34.0)
MCHC: 31.7 g/dL (ref 30.0–36.0)
MCV: 95.7 fL (ref 80.0–100.0)
Monocytes Absolute: 1.7 10*3/uL — ABNORMAL HIGH (ref 0.1–1.0)
Monocytes Relative: 14 %
Neutro Abs: 9.2 10*3/uL — ABNORMAL HIGH (ref 1.7–7.7)
Neutrophils Relative %: 72 %
Platelets: 407 10*3/uL — ABNORMAL HIGH (ref 150–400)
RBC: 2.8 MIL/uL — ABNORMAL LOW (ref 4.22–5.81)
RDW: 21.3 % — ABNORMAL HIGH (ref 11.5–15.5)
WBC: 12.6 10*3/uL — ABNORMAL HIGH (ref 4.0–10.5)
nRBC: 0 % (ref 0.0–0.2)

## 2020-09-15 LAB — COMPREHENSIVE METABOLIC PANEL
ALT: 10 U/L (ref 0–44)
AST: 17 U/L (ref 15–41)
Albumin: 2 g/dL — ABNORMAL LOW (ref 3.5–5.0)
Alkaline Phosphatase: 60 U/L (ref 38–126)
Anion gap: 11 (ref 5–15)
BUN: 9 mg/dL (ref 8–23)
CO2: 21 mmol/L — ABNORMAL LOW (ref 22–32)
Calcium: 8.4 mg/dL — ABNORMAL LOW (ref 8.9–10.3)
Chloride: 103 mmol/L (ref 98–111)
Creatinine, Ser: 1 mg/dL (ref 0.61–1.24)
GFR, Estimated: >60 mL/min (ref >60)
Glucose, Bld: 86 mg/dL (ref 70–99)
Potassium: 4 mmol/L (ref 3.5–5.1)
Sodium: 135 mmol/L (ref 135–145)
Total Bilirubin: 0.9 mg/dL (ref 0.3–1.2)
Total Protein: 5.2 g/dL — ABNORMAL LOW (ref 6.5–8.1)

## 2020-09-15 LAB — PROCALCITONIN: Procalcitonin: 0.14 ng/mL

## 2020-09-15 LAB — D-DIMER, QUANTITATIVE: D-Dimer, Quant: 0.82 ug/mL-FEU — ABNORMAL HIGH (ref 0.00–0.50)

## 2020-09-15 LAB — C-REACTIVE PROTEIN: CRP: 12.3 mg/dL — ABNORMAL HIGH (ref <1.0)

## 2020-09-15 LAB — AMMONIA: Ammonia: 29 umol/L (ref 9–35)

## 2020-09-15 LAB — LACTIC ACID, PLASMA: Lactic Acid, Venous: 1.3 mmol/L (ref 0.5–1.9)

## 2020-09-15 IMAGING — MR MR FOOT*L* W/O CM
6 series · 40 of 40 positions shown · non-contrast
Comparison: None.

CLINICAL DATA: Osteomyelitis foot

EXAM:
MRI OF THE LEFT FOOT WITHOUT CONTRAST
TECHNIQUE: Multiplanar, multisequence MR imaging of the left was performed. No
intravenous contrast was administered.

[Series 4: T1 · coronal · left · 3.0mm · 0.47mm/px · 8 of 78 slices shown (1 of 2)]
[im 1/78]
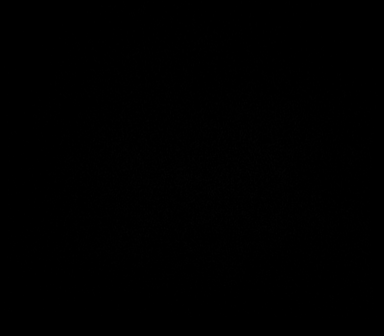
[im 12/78]
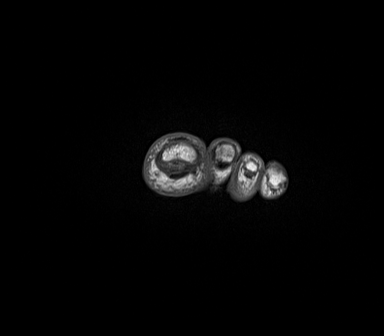
[im 23/78]
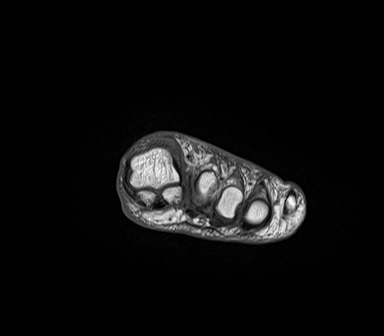
[im 34/78]
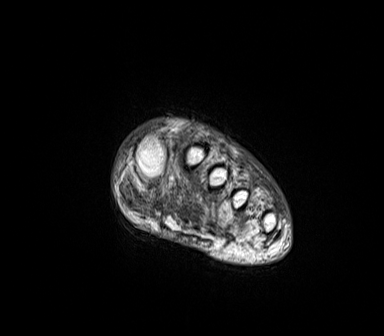
[im 45/78]
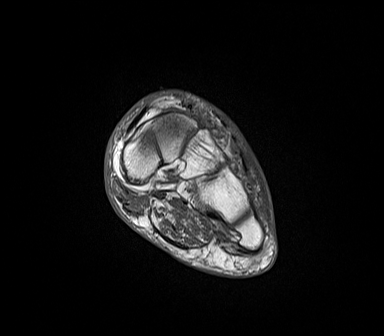
[im 56/78]
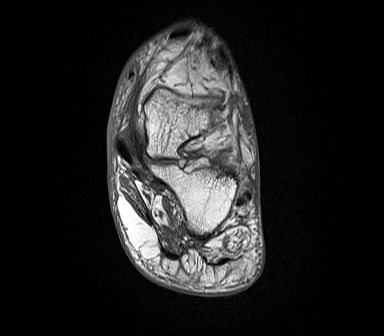
[im 67/78]
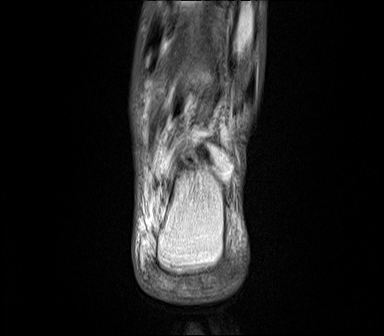
[im 78/78]
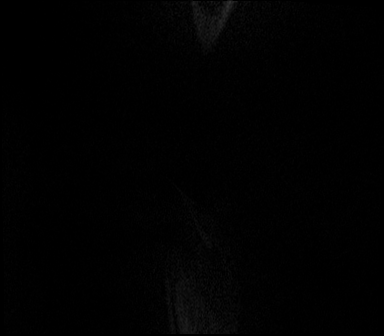

[Series 5: T2 fat-sat · coronal · left · 3.0mm · 0.47mm/px · 9 of 78 slices shown (1 of 2)]
[im 1/78]
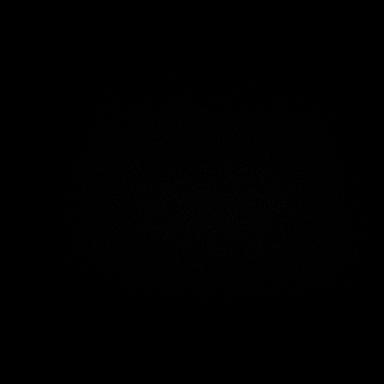
[im 10/78]
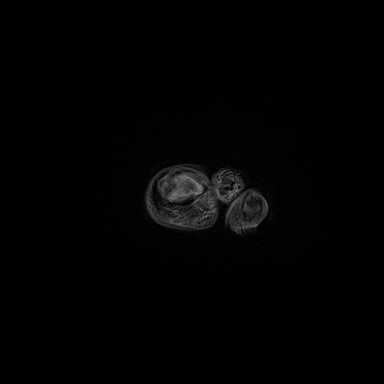
[im 20/78]
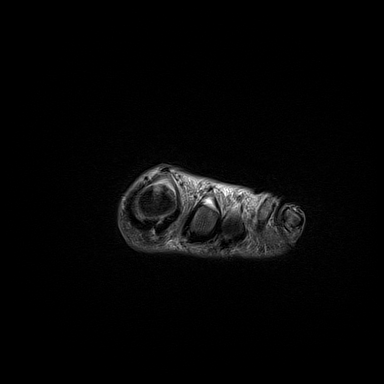
[im 29/78]
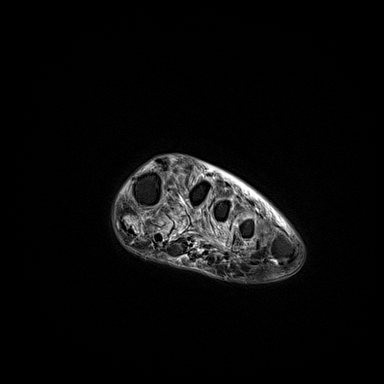
[im 39/78]
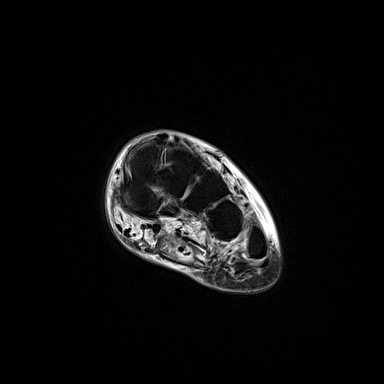
[im 49/78]
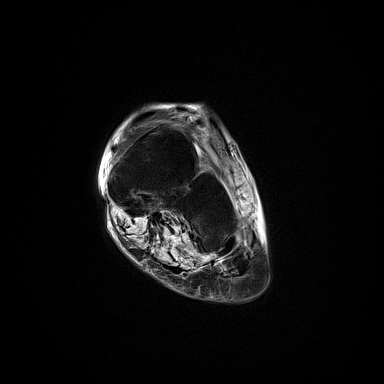
[im 58/78]
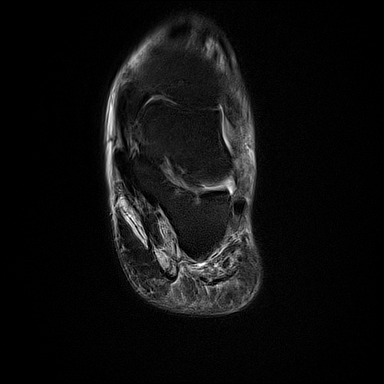
[im 68/78]
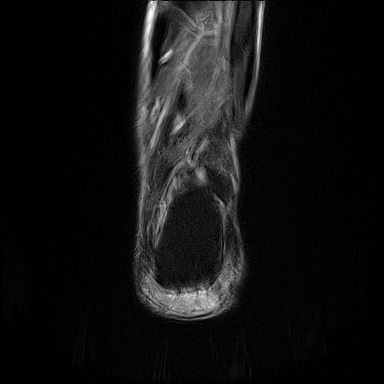
[im 78/78]
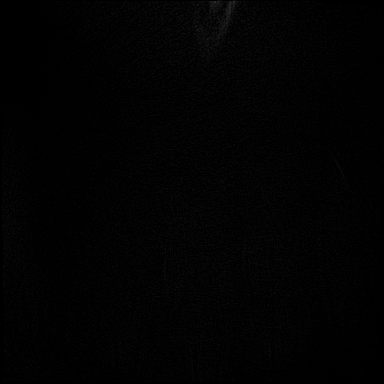

[Series 6: T1 · axial · left · 3.0mm · 0.73mm/px · z∈[-111,+45]mm · 5 of 48 slices shown (2 of 2)]
[im 1/48]
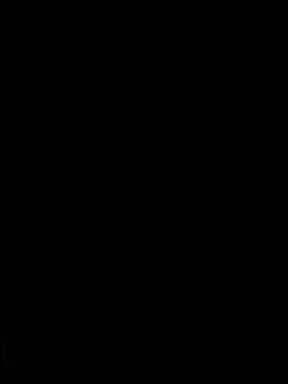
[im 12/48]
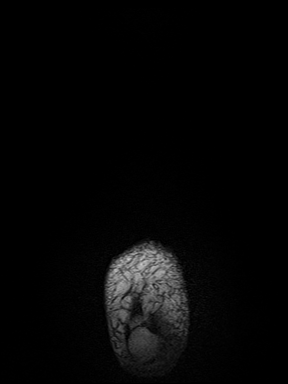
[im 24/48]
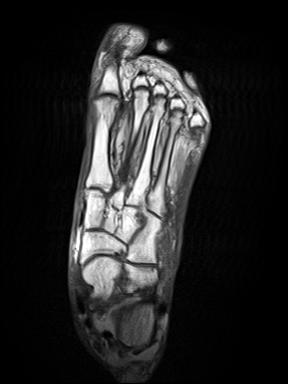
[im 36/48]
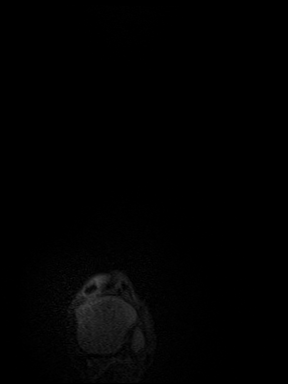
[im 48/48]
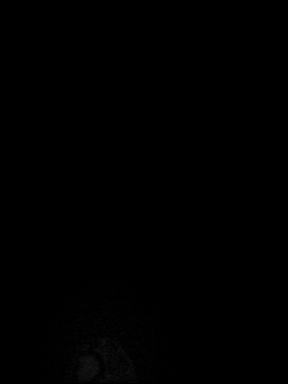

[Series 7: T2 fat-sat · axial · left · 3.0mm · 0.83mm/px · z∈[-101,+54]mm · 5 of 48 slices shown (2 of 2)]
[im 1/48]
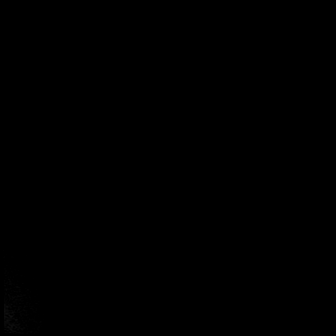
[im 12/48]
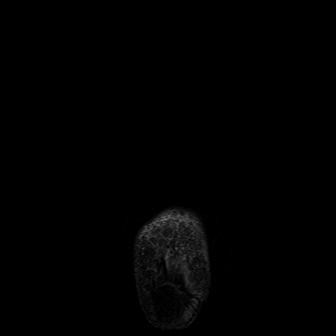
[im 24/48]
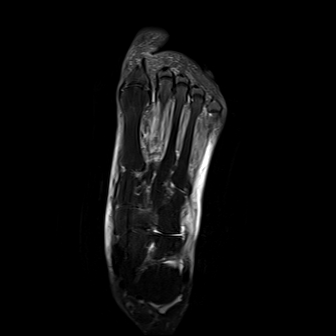
[im 36/48]
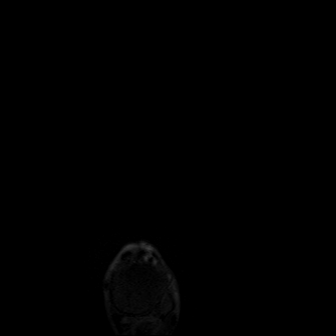
[im 48/48]
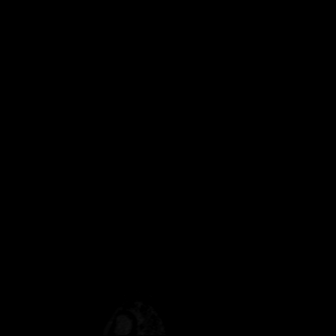

[Series 8: STIR · sagittal · left · 3.0mm · 1.13mm/px · 4 of 32 slices shown]
[im 1/32]
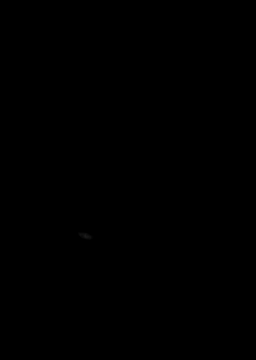
[im 11/32]
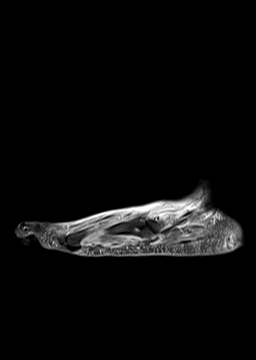
[im 21/32]
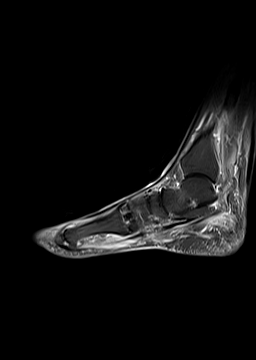
[im 32/32]
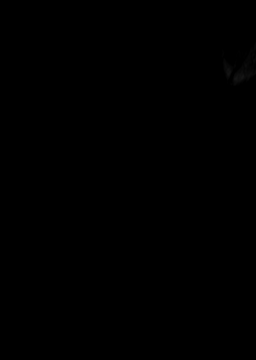

[Series 9: T1 fat-sat · coronal · non-contrast · left · 3.0mm · 0.59mm/px · 9 of 78 slices shown]
[im 1/78]
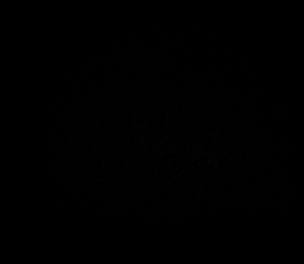
[im 10/78]
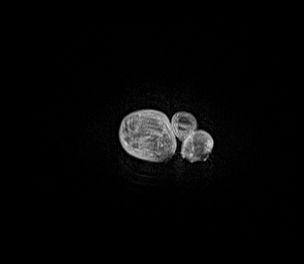
[im 20/78]
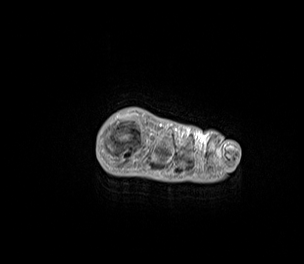
[im 29/78]
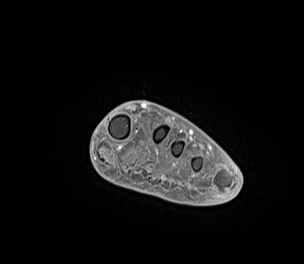
[im 39/78]
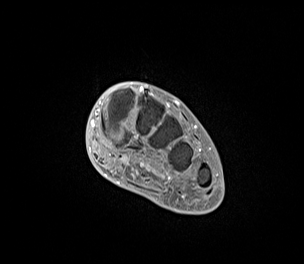
[im 49/78]
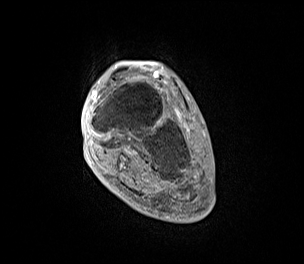
[im 58/78]
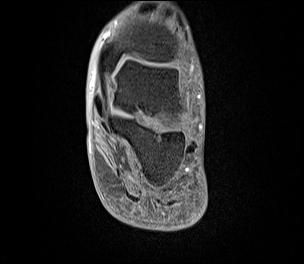
[im 68/78]
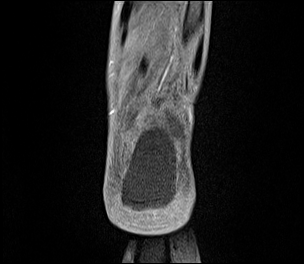
[im 78/78]
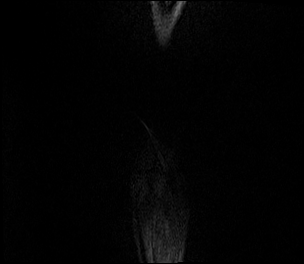

[40 of 40 positions shown; findings below may reference images not displayed]

FINDINGS: Bones/Joint/Cartilage

There is a area of ulceration overlying the distal tuft of the first
digit. Cortical irregularity with erosive type change is noted.
There is diffuse increased T2 hyperintense signal with T1
hypointensity seen throughout the distal first phalanx. The
remainder of the osseous structures appear to be intact. No areas
destruction or periosteal reaction. No large joint effusions are

Ligaments

The Lisfranc ligaments are intact.

Muscles and Tendons

Increased signal with atrophy throughout the muscles of forefoot
likely due chronic denervation. The flexor and extensor tendons are
intact.

Soft tissues

Focal area ulceration seen overlying the distal first tuft. No
loculated fluid collection is. Dorsal subcutaneous edema the
forefoot
IMPRESSION: Focal area of ulceration overlying the first digit with evidence of
osteomyelitis of the distal first phalanx. No loculated fluid
collection.

## 2020-09-15 MED ORDER — HYDRALAZINE HCL 20 MG/ML IJ SOLN: 10.0000 mg | INTRAMUSCULAR | Status: DC | PRN

## 2020-09-15 NOTE — ED Notes (Signed)
Pt sitting up eating breakfast.

## 2020-09-15 NOTE — ED Notes (Signed)
Patient transported to MRI 

## 2020-09-15 NOTE — Progress Notes (Signed)
PROGRESS NOTE    Seth Seth  GYJ:[id number] DOB: Nov 30, 1955 DOA: 09/14/2020 PCP: Lucianne Lei, MD   Brief Narrative:  Seth Seth is a 65 y.o. male with history of alcohol abuse, hypertension and COPD was found to be confused while patient was shopping and was brought to the ER.  At the time of my exam patient has become more alert awake.  He states he has noticed that his left foot swelling with some ulceration on the distal phalanx for the last 1 month denies any trauma or insect bites.  Denies taking any medications.  Admits to drinking alcohol daily.  In the ER patient was confusion initially CT head was unremarkable.  Patient was febrile with temperature of 102 F x-rays of the left foot shows features concerning for osteomyelitis of the distal phalanx.  Patient had blood cultures drawn and started on empiric antibiotics.  Admitted for acute encephalopathy and possible developing sepsis from osteomyelitis cellulitis of the left foot.  COVID test was positive but patient was not hypoxic.  Labs show hemoglobin of 10.  Lactic acid was 2 which improved to 1.9.  Assessment & Plan:   Principal Problem:   Sepsis ([place]) Active Problems:   Chronic alcoholism ([place])   COVID-19 virus infection   Sepsis from left foot osteomyelitis and cellulitis.   MRI: Focal area of ulceration overlying the first digit with evidence of osteomyelitis of the distal first phalanx. No loculated fluid collection..   Cefepime/flagyl/vanc ongoing (ceftriaxone x1 in ED) Orthopedics following - we appreciate insight/recs - likely need amputation; ABI pending - may benefit from vascular consult if abnormal Follow blood cultures continue IV fluids.  COVID-19 infection -presently asymptomatic.   Unclear if acute or incidentally positive from older infection Not hypoxic and chest x-ray unremarkable.   CRP elevated but likely secondary to above sepsis/infection  Alcohol abuse on CIWA protocol.   Advised about  quitting.  Social work consult. Not currently scoring on CIWA protocol  Acute encephalopathy  Secondary to sepsis; resolved with supportive care  COPD, not in acute exacerbation  HTN No home meds - likely controlled   DVT prophylaxis: SQ Heparin. Code Status: Full code. Family Communication: None  Status is: Inpt  Dispo: The patient is from: Home              Anticipated d/c is to: TBD              Anticipated d/c date is: >72h              Patient currently NOT medically stable for discharge  Consultants:   Ortho  Procedures:   Pending evaluation  Antimicrobials:  Cefepime/flagyl/vanc 09/14/20 - ongoing Ceftriaxone x1 in ED on 09/14/20   Subjective: No acute issues/events overnight - denies shortness of breath, headache, fevers, chills, pain, nausea, or vomiting.  Objective: Vitals:   09/15/20 0402 09/15/20 0500 09/15/20 0630 09/15/20 0717  BP: (!) 150/82 (!) 145/84 140/88   Pulse: 97 97 98   Resp:  19 18   Temp:    98.3 F (36.8 C)  TempSrc:    Oral  SpO2:  99% 100%   Weight:      Height:        Intake/Output Summary (Last 24 hours) at 09/15/2020 0801 Last data filed at 09/15/2020 0716 Gross per 24 hour  Intake --  Output 400 ml  Net -400 ml   Filed Weights   09/14/20 1505  Weight: 63.5 kg  Examination:  General exam: Appears calm and comfortable  Respiratory system: Clear to auscultation. Respiratory effort normal. Cardiovascular system: S1 & S2 heard, RRR. No JVD, murmurs, rubs, gallops or clicks. No pedal edema. Gastrointestinal system: Abdomen is nondistended, soft and nontender. No organomegaly or masses felt. Normal bowel sounds heard. Central nervous system: Alert and oriented. No focal neurological deficits. Extremities: Symmetric 5 x 5 power. Skin: Necrotic appearing ulceration of L foot with foul odor Psychiatry: Judgement and insight appear normal. Mood & affect appropriate.     Data Reviewed: I have personally reviewed  following labs and imaging studies  CBC: Recent Labs  Lab 09/14/20 1508  WBC 13.7*  NEUTROABS 10.5*  HGB 10.0*  HCT 32.9*  MCV 96.2  PLT 0000000*   Basic Metabolic Panel: Recent Labs  Lab 09/14/20 1508  NA 135  K 5.0  CL 98  CO2 24  GLUCOSE 104*  BUN 9  CREATININE 1.19  CALCIUM 9.9   GFR: Estimated Creatinine Clearance: 57.1 mL/min (by C-G formula based on SCr of 1.19 mg/dL). Liver Function Tests: Recent Labs  Lab 09/14/20 1508  AST 23  ALT 17  ALKPHOS 95  BILITOT 0.9  PROT 7.1  ALBUMIN 2.8*   No results for input(s): LIPASE, AMYLASE in the last 168 hours. No results for input(s): AMMONIA in the last 168 hours. Coagulation Profile: Recent Labs  Lab 09/14/20 1508  INR 1.1   Cardiac Enzymes: No results for input(s): CKTOTAL, CKMB, CKMBINDEX, TROPONINI in the last 168 hours. BNP (last 3 results) No results for input(s): PROBNP in the last 8760 hours. HbA1C: No results for input(s): HGBA1C in the last 72 hours. CBG: No results for input(s): GLUCAP in the last 168 hours. Lipid Profile: No results for input(s): CHOL, HDL, LDLCALC, TRIG, CHOLHDL, LDLDIRECT in the last 72 hours. Thyroid Function Tests: No results for input(s): TSH, T4TOTAL, FREET4, T3FREE, THYROIDAB in the last 72 hours. Anemia Panel: No results for input(s): VITAMINB12, FOLATE, FERRITIN, TIBC, IRON, RETICCTPCT in the last 72 hours. Sepsis Labs: Recent Labs  Lab 09/14/20 1530 09/14/20 1708  LATICACIDVEN 2.0* 1.9    Recent Results (from the past 240 hour(s))  Culture, blood (Routine x 2)     Status: None (Preliminary result)   Collection Time: 09/14/20  3:08 PM   Specimen: BLOOD  Result Value Ref Range Status   Specimen Description BLOOD SITE NOT SPECIFIED  Final   Special Requests   Final    BOTTLES DRAWN AEROBIC AND ANAEROBIC Blood Culture results may not be optimal due to an inadequate volume of blood received in culture bottles   Culture   Final    NO GROWTH < 24 HOURS Performed  at [facility], [place] [address], [place], [place] [address]    Report Status PENDING  Incomplete  Resp Panel by RT-PCR (Flu A&B, Covid) Nasopharyngeal Swab     Status: Abnormal   Collection Time: 09/14/20  3:12 PM   Specimen: Nasopharyngeal Swab; Nasopharyngeal(NP) swabs in vial transport medium  Result Value Ref Range Status   SARS Coronavirus 2 by RT PCR POSITIVE (A) NEGATIVE Final    Comment: RESULT CALLED TO, READ BACK BY AND VERIFIED WITH: K PATE RN 1630 09/14/20 A BROWNING (NOTE) SARS-CoV-2 target nucleic acids are DETECTED.  The SARS-CoV-2 RNA is generally detectable in upper respiratory specimens during the acute phase of infection. Positive results are indicative of the presence of the identified virus, but do not rule out bacterial infection or co-infection with other  pathogens not detected by the test. Clinical correlation with patient history and other diagnostic information is necessary to determine patient infection status. The expected result is Negative.  Fact Sheet for Patients: EntrepreneurPulse.com.au  Fact Sheet for Healthcare Providers: IncredibleEmployment.be  This test is not yet approved or cleared by the [place] FDA and  has been authorized for detection and/or diagnosis of SARS-CoV-2 by FDA under an Emergency Use Authorization (EUA).  This EUA will remain in effect (meaning this test can be  used) for the duration of  the COVID-19 declaration under Section 564(b)(1) of the Act, 21 U.S.C. section 360bbb-3(b)(1), unless the authorization is terminated or revoked sooner.     Influenza A by PCR NEGATIVE NEGATIVE Final   Influenza B by PCR NEGATIVE NEGATIVE Final    Comment: (NOTE) The Xpert Xpress SARS-CoV-2/FLU/RSV plus assay is intended as an aid in the diagnosis of influenza from Nasopharyngeal swab specimens and should not be used as a sole basis for treatment. Nasal washings and aspirates are  unacceptable for Xpert Xpress SARS-CoV-2/FLU/RSV testing.  Fact Sheet for Patients: EntrepreneurPulse.com.au  Fact Sheet for Healthcare Providers: IncredibleEmployment.be  This test is not yet approved or cleared by the [place] FDA and has been authorized for detection and/or diagnosis of SARS-CoV-2 by FDA under an Emergency Use Authorization (EUA). This EUA will remain in effect (meaning this test can be used) for the duration of the COVID-19 declaration under Section 564(b)(1) of the Act, 21 U.S.C. section 360bbb-3(b)(1), unless the authorization is terminated or revoked.  Performed at [facility], [place] [address], [place], [place] [address]   Culture, blood (Routine x 2)     Status: None (Preliminary result)   Collection Time: 09/14/20  7:31 PM   Specimen: BLOOD LEFT HAND  Result Value Ref Range Status   Specimen Description BLOOD LEFT HAND  Final   Special Requests   Final    BOTTLES DRAWN AEROBIC ONLY Blood Culture results may not be optimal due to an inadequate volume of blood received in culture bottles   Culture   Final    NO GROWTH < 12 HOURS Performed at [facility], [place] [address] [place], [place] [address]    Report Status PENDING  Incomplete         Radiology Studies: CT Head Wo Contrast  Result Date: 09/14/2020 CLINICAL DATA:  Delirium. EXAM: CT HEAD WITHOUT CONTRAST TECHNIQUE: Contiguous axial images were obtained from the base of the skull through the vertex without intravenous contrast. COMPARISON:  Prior head CT examinations 07/19/2020 and earlier. FINDINGS: Brain: Mildly motion degraded exam. Mild cerebral and cerebellar atrophy. 1.0 x 0.5 cm lobular mass along the right aspect of the falx, overlying the right frontal lobe (series 4, image 26). In retrospect this finding was present on the prior examination of 07/19/2020 and is compatible with a small incidental meningioma. Mild ill-defined  hypoattenuation within the cerebral white matter is nonspecific, but compatible with chronic small vessel ischemic disease. 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 There is no acute intracranial hemorrhage. No demarcated cortical infarct. No extra-axial fluid collection. No midline shift. Vascular: No hyperdense vessel.  Atherosclerotic calcifications Skull: Normal. Negative for fracture or focal lesion. Sinuses/Orbits: Visualized orbits show no acute finding. Redemonstrated 10 mm polyp versus mucous retention cyst within the superomedial right maxillary sinus. Mild frothy secretions within the right maxillary sinus. Frothy secretions within the right sphenoid sinus. Frothy secretions within a posterior right ethmoid air cell. Background moderate  bilateral ethmoid sinus mucosal thickening. Mild mucosal thickening within the left frontal sinus. IMPRESSION: Mildly motion degraded exam. No evidence of acute intracranial abnormality. Incidental 1 cm right parafalcine meningioma, unchanged from the head CT of 07/19/2020. Stable mild cerebral atrophy and chronic small vessel ischemic disease. 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 Paranasal sinus disease as described. Correlate for acute on chronic sinusitis. Electronically Signed   By: Kellie Simmering DO   On: 09/14/2020 18:08   MR FOOT LEFT WO CONTRAST  Result Date: 09/15/2020 CLINICAL DATA:  Osteomyelitis foot EXAM: MRI OF THE LEFT FOOT WITHOUT CONTRAST TECHNIQUE: Multiplanar, multisequence MR imaging of the left was performed. No intravenous contrast was administered. COMPARISON:  None. FINDINGS: Bones/Joint/Cartilage There is a area of ulceration overlying the distal tuft of the first digit. Cortical irregularity with erosive type change is noted. There is diffuse increased T2 hyperintense signal with T1 hypointensity seen throughout the distal first phalanx. The remainder of the osseous  structures appear to be intact. No areas destruction or periosteal reaction. No large joint effusions are Ligaments The Lisfranc ligaments are intact. Muscles and Tendons Increased signal with atrophy throughout the muscles of forefoot likely due chronic denervation. The flexor and extensor tendons are intact. Soft tissues Focal area ulceration seen overlying the distal first tuft. No loculated fluid collection is. Dorsal subcutaneous edema the forefoot IMPRESSION: Focal area of ulceration overlying the first digit with evidence of osteomyelitis of the distal first phalanx. No loculated fluid collection. Electronically Signed   By: Prudencio Pair M.D.   On: 09/15/2020 03:12   DG Chest Portable 1 View  Result Date: 09/14/2020 CLINICAL DATA:  Cough.  Fever. EXAM: PORTABLE CHEST 1 VIEW COMPARISON:  07/28/2020.  07/22/2020.  05/31/2019. FINDINGS: Mediastinum and hilar structures are stable. Heart size normal. No focal infiltrate. Stable right base pleural thickening consistent with scarring. No pneumothorax. IMPRESSION: No acute cardiopulmonary disease. Electronically Signed   By: Marcello Moores  Register   On: 09/14/2020 15:31   DG Foot Complete Left  Result Date: 09/14/2020 CLINICAL DATA:  Multiple chronic wounds EXAM: LEFT FOOT - COMPLETE 3+ VIEW COMPARISON:  None. FINDINGS: Large ulcer or wound at the distal first digit. Bony destructive change involving midportion of first distal phalanx with probable pathologic fracture. Small erosions at the base of the first distal phalanx and head of the proximal phalanx on the medial side. Possible wound or ulcer at the tip of the third distal digit with mild erosion of the tuft of the distal phalanx third digit. No radiopaque foreign body. Vascular calcifications. Degenerative changes at the first MTP joint IMPRESSION: 1. Large ulcer or wound at the distal first digit with bony destructive change/suspected osteomyelitis involving the distal phalanx with probable pathologic  fracture through the mid segment. 2. Small erosions at the first distal phalanx and head of the first proximal phalanx, suspect for osteomyelitis. 3. Possible wound or ulcer at the tip of the third distal digit with possible erosion of the tuft of the distal phalanx, question osteomyelitis. Electronically Signed   By: Donavan Foil M.D.   On: 09/14/2020 18:36   Scheduled Meds: . folic acid  1 mg Oral Daily  . heparin  5,000 Units Subcutaneous Q8H  . LORazepam  0-4 mg Oral Q6H   Followed by  . [START ON 09/16/2020] LORazepam  0-4 mg Oral Q12H  . multivitamin with minerals  1 tablet Oral Daily  . thiamine  100 mg Oral Daily   Or  . thiamine  100 mg Intravenous Daily   Continuous Infusions: . ceFEPime (MAXIPIME) IV Stopped (09/15/20 0036)  . lactated ringers 100 mL/hr at 09/14/20 2310  . metronidazole 500 mg (09/15/20 0706)  . vancomycin       LOS: 1 day   Time spent: 72min  Pluma Diniz C Hazael Olveda, DO [facility]  If 7PM-7AM, please contact night-coverage [email or web address]  09/15/2020, 8:01 AM

## 2020-09-15 NOTE — ED Notes (Signed)
Lunch Tray Ordered @ [id number]
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
## 2020-09-16 ENCOUNTER — Inpatient Hospital Stay (HOSPITAL_COMMUNITY): Payer: Medicare (Managed Care)

## 2020-09-16 DIAGNOSIS — L0390 Cellulitis, unspecified: Secondary | ICD-10-CM | POA: Diagnosis not present

## 2020-09-16 DIAGNOSIS — M869 Osteomyelitis, unspecified: Secondary | ICD-10-CM

## 2020-09-16 DIAGNOSIS — A419 Sepsis, unspecified organism: Secondary | ICD-10-CM | POA: Diagnosis not present

## 2020-09-16 DIAGNOSIS — F1020 Alcohol dependence, uncomplicated: Secondary | ICD-10-CM | POA: Diagnosis not present

## 2020-09-16 LAB — COMPREHENSIVE METABOLIC PANEL
ALT: 10 U/L (ref 0–44)
AST: 15 U/L (ref 15–41)
Albumin: 2.1 g/dL — ABNORMAL LOW (ref 3.5–5.0)
Alkaline Phosphatase: 62 U/L (ref 38–126)
Anion gap: 12 (ref 5–15)
BUN: 8 mg/dL (ref 8–23)
CO2: 24 mmol/L (ref 22–32)
Calcium: 9.2 mg/dL (ref 8.9–10.3)
Chloride: 101 mmol/L (ref 98–111)
Creatinine, Ser: 1.03 mg/dL (ref 0.61–1.24)
GFR, Estimated: >60 mL/min (ref >60)
Glucose, Bld: 85 mg/dL (ref 70–99)
Potassium: 4.3 mmol/L (ref 3.5–5.1)
Sodium: 137 mmol/L (ref 135–145)
Total Bilirubin: 0.9 mg/dL (ref 0.3–1.2)
Total Protein: 5.7 g/dL — ABNORMAL LOW (ref 6.5–8.1)

## 2020-09-16 LAB — CBC WITH DIFFERENTIAL/PLATELET
Abs Immature Granulocytes: 0.09 10*3/uL — ABNORMAL HIGH (ref 0.00–0.07)
Basophils Absolute: 0 10*3/uL (ref 0.0–0.1)
Basophils Relative: 0 %
Eosinophils Absolute: 0.2 10*3/uL (ref 0.0–0.5)
Eosinophils Relative: 1 %
HCT: 26.9 % — ABNORMAL LOW (ref 39.0–52.0)
Hemoglobin: 8.6 g/dL — ABNORMAL LOW (ref 13.0–17.0)
Immature Granulocytes: 1 %
Lymphocytes Relative: 11 %
Lymphs Abs: 1.4 10*3/uL (ref 0.7–4.0)
MCH: 30.2 pg (ref 26.0–34.0)
MCHC: 32 g/dL (ref 30.0–36.0)
MCV: 94.4 fL (ref 80.0–100.0)
Monocytes Absolute: 1.5 10*3/uL — ABNORMAL HIGH (ref 0.1–1.0)
Monocytes Relative: 13 %
Neutro Abs: 9 10*3/uL — ABNORMAL HIGH (ref 1.7–7.7)
Neutrophils Relative %: 74 %
Platelets: 450 10*3/uL — ABNORMAL HIGH (ref 150–400)
RBC: 2.85 MIL/uL — ABNORMAL LOW (ref 4.22–5.81)
RDW: 21.2 % — ABNORMAL HIGH (ref 11.5–15.5)
WBC: 12.2 10*3/uL — ABNORMAL HIGH (ref 4.0–10.5)
nRBC: 0 % (ref 0.0–0.2)

## 2020-09-16 LAB — D-DIMER, QUANTITATIVE: D-Dimer, Quant: 0.54 ug/mL-FEU — ABNORMAL HIGH (ref 0.00–0.50)

## 2020-09-16 LAB — URINE CULTURE: Culture: NO GROWTH

## 2020-09-16 LAB — C-REACTIVE PROTEIN: CRP: 13.7 mg/dL — ABNORMAL HIGH (ref <1.0)

## 2020-09-16 MED ORDER — SODIUM CHLORIDE 0.9 % IV SOLN
2.0000 g | Freq: Three times a day (TID) | INTRAVENOUS | Status: DC
  Administered 2020-09-16 – 2020-09-23 (×21): 2 g via INTRAVENOUS
  Filled 2020-09-16 (×21): qty 2

## 2020-09-16 MED ORDER — METRONIDAZOLE 500 MG PO TABS
500.0000 mg | ORAL_TABLET | Freq: Three times a day (TID) | ORAL | Status: DC
Start: 2020-09-16 — End: 2020-09-23
  Administered 2020-09-16 – 2020-09-23 (×20): 500 mg via ORAL
  Filled 2020-09-16 (×20): qty 1

## 2020-09-16 NOTE — Progress Notes (Signed)
ABI's have been completed. Preliminary results can be found in CV Proc through chart review.   09/16/20 3:31 PM [place] [name] RVT

## 2020-09-16 NOTE — ED Notes (Signed)
Breakfast ordered 

## 2020-09-16 NOTE — Progress Notes (Addendum)
CSW attempted to call patient to offer resources; call unsuccessful, will attempt again, though patient was given resources last month before he went to [facility] for rehab.   Nadia Rayyan LCSW

## 2020-09-16 NOTE — Progress Notes (Signed)
PROGRESS NOTE    Seth [name]  [id number] DOB: 03-31-1956 DOA: 09/14/2020 PCP: Lucianne Lei, MD   Brief Narrative:  Seth [name] is a 65 y.o. male with history of alcohol abuse, hypertension and COPD was found to be confused while patient was shopping and was brought to the ER.  At the time of my exam patient has become more alert awake.  He states he has noticed that his left foot swelling with some ulceration on the distal phalanx for the last 1 month denies any trauma or insect bites.  Denies taking any medications.  Admits to drinking alcohol daily.  In the ER patient was confusion initially CT head was unremarkable.  Patient was febrile with temperature of 102 F x-rays of the left foot shows features concerning for osteomyelitis of the distal phalanx.  Patient had blood cultures drawn and started on empiric antibiotics.  Admitted for acute encephalopathy and possible developing sepsis from osteomyelitis cellulitis of the left foot.  COVID test was positive but patient was not hypoxic.  Labs show hemoglobin of 10.  Lactic acid was 2 which improved to 1.9.  Assessment & Plan:   Principal Problem:   Sepsis ([place]) Active Problems:   Chronic alcoholism ([place])   COVID-19 virus infection   Sepsis from left foot osteomyelitis and cellulitis.   MRI: Focal area of ulceration overlying the first digit with evidence of osteomyelitis of the distal first phalanx. No loculated fluid collection..   Cefepime/flagyl/vanc ongoing (ceftriaxone x1 in ED) Orthopedics following - we appreciate insight/recs - likely need amputation; ABI pending - may benefit from vascular consult if abnormal Follow blood cultures continue IV fluids.  COVID-19 infection -presently asymptomatic.   Unclear if acute or incidentally positive from older infection Not hypoxic and chest x-ray unremarkable.   CRP elevated but likely secondary to above sepsis/infection  Alcohol abuse on CIWA protocol.   Advised about  quitting.  Social work consult. Not currently scoring on CIWA protocol  Acute encephalopathy  Secondary to sepsis; resolved with supportive care  COPD, not in acute exacerbation  HTN No home meds - likely controlled   DVT prophylaxis: SQ Heparin. Code Status: Full code. Family Communication: None  Status is: Inpt  Dispo: The patient is from: Home              Anticipated d/c is to: TBD              Anticipated d/c date is: >72h              Patient currently NOT medically stable for discharge  Consultants:   Ortho  Procedures:   Pending evaluation  Antimicrobials:  Cefepime/flagyl/vanc 09/14/20 - ongoing Ceftriaxone x1 in ED on 09/14/20   Subjective: No acute issues/events overnight - denies shortness of breath, headache, fevers, chills, pain, nausea, or vomiting.  Objective: Vitals:   09/16/20 0600 09/16/20 0639 09/16/20 0707 09/16/20 0729  BP: (!) 139/94  (!) 155/92 (!) 150/96  Pulse: (!) 108     Resp: (!) 21  (!) 28 19  Temp:  98.6 F (37 C) 97.8 F (36.6 C) 98 F (36.7 C)  TempSrc:  Oral Oral Oral  SpO2: 100%  100% 100%  Weight:      Height:        Intake/Output Summary (Last 24 hours) at 09/16/2020 0738 Last data filed at 09/16/2020 0152 Gross per 24 hour  Intake 1394.52 ml  Output 400 ml  Net 994.52 ml   [employer]  09/14/20 1505  Weight: 63.5 kg    Examination:  General exam: Appears calm and comfortable  Respiratory system: Clear to auscultation. Respiratory effort normal. Cardiovascular system: S1 & S2 heard, RRR. No JVD, murmurs, rubs, gallops or clicks. No pedal edema. Gastrointestinal system: Abdomen is nondistended, soft and nontender. No organomegaly or masses felt. Normal bowel sounds heard. Central nervous system: Alert and oriented. No focal neurological deficits. Extremities: Symmetric 5 x 5 power. Skin: Necrotic appearing ulceration of L foot with foul odor Psychiatry: Judgement and insight appear normal. Mood & affect  appropriate.     Data Reviewed: I have personally reviewed following labs and imaging studies  CBC: Recent Labs  Lab 09/14/20 1508 09/15/20 0820 09/16/20 0552  WBC 13.7* 12.6* 12.2*  NEUTROABS 10.5* 9.2* PENDING  HGB 10.0* 8.5* 8.6*  HCT 32.9* 26.8* 26.9*  MCV 96.2 95.7 94.4  PLT 507* 407* [id number]*   Basic Metabolic Panel: Recent Labs  Lab 09/14/20 1508 09/15/20 0820 09/16/20 0552  NA 135 135 137  K 5.0 4.0 4.3  CL 98 103 101  CO2 24 21* 24  GLUCOSE 104* 86 85  BUN 9 9 8   CREATININE 1.19 1.00 1.03  CALCIUM 9.9 8.4* 9.2   GFR: Estimated Creatinine Clearance: 65.9 mL/min (by C-G formula based on SCr of 1.03 mg/dL). Liver Function Tests: Recent Labs  Lab 09/14/20 1508 09/15/20 0820 09/16/20 0552  AST 23 17 15   ALT 17 10 10   ALKPHOS 95 60 62  BILITOT 0.9 0.9 0.9  PROT 7.1 5.2* 5.7*  ALBUMIN 2.8* 2.0* 2.1*   No results for input(s): LIPASE, AMYLASE in the last 168 hours. Recent Labs  Lab 09/15/20 0820  AMMONIA 29   Coagulation Profile: Recent Labs  Lab 09/14/20 1508  INR 1.1   Cardiac Enzymes: No results for input(s): CKTOTAL, CKMB, CKMBINDEX, TROPONINI in the last 168 hours. BNP (last 3 results) No results for input(s): PROBNP in the last 8760 hours. HbA1C: No results for input(s): HGBA1C in the last 72 hours. CBG: No results for input(s): GLUCAP in the last 168 hours. Lipid Profile: No results for input(s): CHOL, HDL, LDLCALC, TRIG, CHOLHDL, LDLDIRECT in the last 72 hours. Thyroid Function Tests: No results for input(s): TSH, T4TOTAL, FREET4, T3FREE, THYROIDAB in the last 72 hours. Anemia Panel: No results for input(s): VITAMINB12, FOLATE, FERRITIN, TIBC, IRON, RETICCTPCT in the last 72 hours. Sepsis Labs: Recent Labs  Lab 09/14/20 1530 09/14/20 1708 09/15/20 0820  PROCALCITON  --   --  0.14  LATICACIDVEN 2.0* 1.9 1.3    Recent Results (from the past 240 hour(s))  Culture, blood (Routine x 2)     Status: None (Preliminary result)    Collection Time: 09/14/20  3:08 PM   Specimen: BLOOD  Result Value Ref Range Status   Specimen Description BLOOD SITE NOT SPECIFIED  Final   Special Requests   Final    BOTTLES DRAWN AEROBIC AND ANAEROBIC Blood Culture results may not be optimal due to an inadequate volume of blood received in culture bottles   Culture   Final    NO GROWTH < 24 HOURS Performed at [facility], [place] [address], [place], [place] [address]    Report Status PENDING  Incomplete  Resp Panel by RT-PCR (Flu A&B, Covid) Nasopharyngeal Swab     Status: Abnormal   Collection Time: 09/14/20  3:12 PM   Specimen: Nasopharyngeal Swab; Nasopharyngeal(NP) swabs in vial transport medium  Result Value Ref Range Status   SARS Coronavirus  2 by RT PCR POSITIVE (A) NEGATIVE Final    Comment: RESULT CALLED TO, READ BACK BY AND VERIFIED WITH: K PATE RN 1630 09/14/20 A BROWNING (NOTE) SARS-CoV-2 target nucleic acids are DETECTED.  The SARS-CoV-2 RNA is generally detectable in upper respiratory specimens during the acute phase of infection. Positive results are indicative of the presence of the identified virus, but do not rule out bacterial infection or co-infection with other pathogens not detected by the test. Clinical correlation with patient history and other diagnostic information is necessary to determine patient infection status. The expected result is Negative.  Fact Sheet for Patients: EntrepreneurPulse.com.au  Fact Sheet for Healthcare Providers: IncredibleEmployment.be  This test is not yet approved or cleared by the [place] FDA and  has been authorized for detection and/or diagnosis of SARS-CoV-2 by FDA under an Emergency Use Authorization (EUA).  This EUA will remain in effect (meaning this test can be  used) for the duration of  the COVID-19 declaration under Section 564(b)(1) of the Act, 21 U.S.C. section 360bbb-3(b)(1), unless the authorization is terminated  or revoked sooner.     Influenza A by PCR NEGATIVE NEGATIVE Final   Influenza B by PCR NEGATIVE NEGATIVE Final    Comment: (NOTE) The Xpert Xpress SARS-CoV-2/FLU/RSV plus assay is intended as an aid in the diagnosis of influenza from Nasopharyngeal swab specimens and should not be used as a sole basis for treatment. Nasal washings and aspirates are unacceptable for Xpert Xpress SARS-CoV-2/FLU/RSV testing.  Fact Sheet for Patients: EntrepreneurPulse.com.au  Fact Sheet for Healthcare Providers: IncredibleEmployment.be  This test is not yet approved or cleared by the [place] FDA and has been authorized for detection and/or diagnosis of SARS-CoV-2 by FDA under an Emergency Use Authorization (EUA). This EUA will remain in effect (meaning this test can be used) for the duration of the COVID-19 declaration under Section 564(b)(1) of the Act, 21 U.S.C. section 360bbb-3(b)(1), unless the authorization is terminated or revoked.  Performed at [name], [place] [address] [place], [place] [address]   Culture, blood (Routine x 2)     Status: None (Preliminary result)   Collection Time: 09/14/20  7:31 PM   Specimen: BLOOD LEFT HAND  Result Value Ref Range Status   Specimen Description BLOOD LEFT HAND  Final   Special Requests   Final    BOTTLES DRAWN AEROBIC ONLY Blood Culture results may not be optimal due to an inadequate volume of blood received in culture bottles   Culture   Final    NO GROWTH < 12 HOURS Performed at [facility], [place] [address], [place], [place] [address]    Report Status PENDING  Incomplete         Radiology Studies: CT Head Wo Contrast  Result Date: 09/14/2020 CLINICAL DATA:  Delirium. EXAM: CT HEAD WITHOUT CONTRAST TECHNIQUE: Contiguous axial images were obtained from the base of the skull through the vertex without intravenous contrast. COMPARISON:  Prior head CT examinations 07/19/2020 and earlier.  FINDINGS: Brain: Mildly motion degraded exam. Mild cerebral and cerebellar atrophy. 1.0 x 0.5 cm lobular mass along the right aspect of the falx, overlying the right frontal lobe (series 4, image 26). In retrospect this finding was present on the prior examination of 07/19/2020 and is compatible with a small incidental meningioma. Mild ill-defined hypoattenuation within the cerebral white matter is nonspecific, but compatible with chronic small vessel ischemic disease. 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 There is no acute intracranial  hemorrhage. No demarcated cortical infarct. No extra-axial fluid collection. No midline shift. Vascular: No hyperdense vessel.  Atherosclerotic calcifications Skull: Normal. Negative for fracture or focal lesion. Sinuses/Orbits: Visualized orbits show no acute finding. Redemonstrated 10 mm polyp versus mucous retention cyst within the superomedial right maxillary sinus. Mild frothy secretions within the right maxillary sinus. Frothy secretions within the right sphenoid sinus. Frothy secretions within a posterior right ethmoid air cell. Background moderate bilateral ethmoid sinus mucosal thickening. Mild mucosal thickening within the left frontal sinus. IMPRESSION: Mildly motion degraded exam. No evidence of acute intracranial abnormality. Incidental 1 cm right parafalcine meningioma, unchanged from the head CT of 07/19/2020. Stable mild cerebral atrophy and chronic small vessel ischemic disease. 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 Paranasal sinus disease as described. Correlate for acute on chronic sinusitis. Electronically Signed   By: Kellie Simmering DO   On: 09/14/2020 18:08   MR FOOT LEFT WO CONTRAST  Result Date: 09/15/2020 CLINICAL DATA:  Osteomyelitis foot EXAM: MRI OF THE LEFT FOOT WITHOUT CONTRAST TECHNIQUE: Multiplanar, multisequence MR imaging of the left was performed. No intravenous contrast  was administered. COMPARISON:  None. FINDINGS: Bones/Joint/Cartilage There is a area of ulceration overlying the distal tuft of the first digit. Cortical irregularity with erosive type change is noted. There is diffuse increased T2 hyperintense signal with T1 hypointensity seen throughout the distal first phalanx. The remainder of the osseous structures appear to be intact. No areas destruction or periosteal reaction. No large joint effusions are Ligaments The Lisfranc ligaments are intact. Muscles and Tendons Increased signal with atrophy throughout the muscles of forefoot likely due chronic denervation. The flexor and extensor tendons are intact. Soft tissues Focal area ulceration seen overlying the distal first tuft. No loculated fluid collection is. Dorsal subcutaneous edema the forefoot IMPRESSION: Focal area of ulceration overlying the first digit with evidence of osteomyelitis of the distal first phalanx. No loculated fluid collection. Electronically Signed   By: Prudencio Pair M.D.   On: 09/15/2020 03:12   DG Chest Portable 1 View  Result Date: 09/14/2020 CLINICAL DATA:  Cough.  Fever. EXAM: PORTABLE CHEST 1 VIEW COMPARISON:  07/28/2020.  07/22/2020.  05/31/2019. FINDINGS: Mediastinum and hilar structures are stable. Heart size normal. No focal infiltrate. Stable right base pleural thickening consistent with scarring. No pneumothorax. IMPRESSION: No acute cardiopulmonary disease. Electronically Signed   By: Marcello Moores  Register   On: 09/14/2020 15:31   DG Foot Complete Left  Result Date: 09/14/2020 CLINICAL DATA:  Multiple chronic wounds EXAM: LEFT FOOT - COMPLETE 3+ VIEW COMPARISON:  None. FINDINGS: Large ulcer or wound at the distal first digit. Bony destructive change involving midportion of first distal phalanx with probable pathologic fracture. Small erosions at the base of the first distal phalanx and head of the proximal phalanx on the medial side. Possible wound or ulcer at the tip of the third  distal digit with mild erosion of the tuft of the distal phalanx third digit. No radiopaque foreign body. Vascular calcifications. Degenerative changes at the first MTP joint IMPRESSION: 1. Large ulcer or wound at the distal first digit with bony destructive change/suspected osteomyelitis involving the distal phalanx with probable pathologic fracture through the mid segment. 2. Small erosions at the first distal phalanx and head of the first proximal phalanx, suspect for osteomyelitis. 3. Possible wound or ulcer at the tip of the third distal digit with possible erosion of the tuft of the distal phalanx, question osteomyelitis. Electronically Signed   By: Donavan Foil  M.D.   On: 09/14/2020 18:36   Scheduled Meds: . folic acid  1 mg Oral Daily  . heparin  5,000 Units Subcutaneous Q8H  . LORazepam  0-4 mg Oral Q6H   Followed by  . LORazepam  0-4 mg Oral Q12H  . multivitamin with minerals  1 tablet Oral Daily  . thiamine  100 mg Oral Daily   Or  . thiamine  100 mg Intravenous Daily   Continuous Infusions: . ceFEPime (MAXIPIME) IV Stopped (09/15/20 2304)  . metronidazole 500 mg (09/16/20 0538)  . vancomycin Stopped (09/15/20 2050)     LOS: 2 days   Time spent: 30min  Tenelle Andreason C Clemence Lengyel, DO [facility]  If 7PM-7AM, please contact night-coverage [email or web address]  09/16/2020, 7:38 AM

## 2020-09-16 NOTE — Progress Notes (Signed)
Pharmacy Antibiotic Note  Seth [name] is a 65 y.o. male admitted on 09/14/2020 with sepsis secondary to wound on left foot. Pharmacy has been consulted for vancomcyin dosing. PMH significant for HTN, BPH, gout, COPD, hx CVA, depression, alcohol dependence, tobacco use.  Ortho has seen and recommends VVS consult. Amputation likely needed. His scr has improved to 1.03 so we will adjust his vanc/cefepime today.   Plan: Change vanc to 1000 mg q12 - Predicted AUC 515, Scr 1.03 - Goal AUC 400-550  Change cefepime to 2g IV q8  Height: 6' (182.9 cm) Weight: 63.5 kg (140 lb) IBW/kg (Calculated) : 77.6  Temp (24hrs), Avg:98 F (36.7 C), Min:97.6 F (36.4 C), Max:98.6 F (37 C)  Recent Labs  Lab 09/14/20 1508 09/14/20 1530 09/14/20 1708 09/15/20 0820 09/16/20 0552  WBC 13.7*  --   --  12.6* 12.2*  CREATININE 1.19  --   --  1.00 1.03  LATICACIDVEN  --  2.0* 1.9 1.3  --     Estimated Creatinine Clearance: 65.9 mL/min (by C-G formula based on SCr of 1.03 mg/dL).    No Known Allergies  Antimicrobials this admission: Ceftriaxone 1/18 x1 Vancomcyin 1/18>> Cefepime 1/18>> Flagyl 1/18>> Dose adjustments this admission:  Microbiology results: 1/18BCx: ngtd 1/18UCx: ngF 1/18 UA: neg for UTI 1/18 Respiratory PCR: COVID positive  Onnie Boer, PharmD, BCIDP, AAHIVP, CPP Infectious Disease Pharmacist 09/16/2020 1:04 PM
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
## 2020-09-17 ENCOUNTER — Encounter (HOSPITAL_COMMUNITY)
Admission: EM | Disposition: A | Discharge status: Home Health | Payer: Self-pay | Source: Home / Self Care | Attending: Internal Medicine

## 2020-09-17 DIAGNOSIS — F1020 Alcohol dependence, uncomplicated: Secondary | ICD-10-CM | POA: Diagnosis not present

## 2020-09-17 DIAGNOSIS — A419 Sepsis, unspecified organism: Secondary | ICD-10-CM | POA: Diagnosis not present

## 2020-09-17 LAB — COMPREHENSIVE METABOLIC PANEL
ALT: 10 U/L (ref 0–44)
AST: 15 U/L (ref 15–41)
Albumin: 2.3 g/dL — ABNORMAL LOW (ref 3.5–5.0)
Alkaline Phosphatase: 62 U/L (ref 38–126)
Anion gap: 14 (ref 5–15)
BUN: 8 mg/dL (ref 8–23)
CO2: 19 mmol/L — ABNORMAL LOW (ref 22–32)
Calcium: 9 mg/dL (ref 8.9–10.3)
Chloride: 103 mmol/L (ref 98–111)
Creatinine, Ser: 0.96 mg/dL (ref 0.61–1.24)
GFR, Estimated: >60 mL/min (ref >60)
Glucose, Bld: 85 mg/dL (ref 70–99)
Potassium: 3.9 mmol/L (ref 3.5–5.1)
Sodium: 136 mmol/L (ref 135–145)
Total Bilirubin: 0.9 mg/dL (ref 0.3–1.2)
Total Protein: 6.1 g/dL — ABNORMAL LOW (ref 6.5–8.1)

## 2020-09-17 LAB — D-DIMER, QUANTITATIVE: D-Dimer, Quant: 0.62 ug/mL-FEU — ABNORMAL HIGH (ref 0.00–0.50)

## 2020-09-17 LAB — CBC WITH DIFFERENTIAL/PLATELET
Abs Immature Granulocytes: 0.08 10*3/uL — ABNORMAL HIGH (ref 0.00–0.07)
Basophils Absolute: 0 10*3/uL (ref 0.0–0.1)
Basophils Relative: 0 %
Eosinophils Absolute: 0.3 10*3/uL (ref 0.0–0.5)
Eosinophils Relative: 3 %
HCT: 28.5 % — ABNORMAL LOW (ref 39.0–52.0)
Hemoglobin: 8.8 g/dL — ABNORMAL LOW (ref 13.0–17.0)
Immature Granulocytes: 1 %
Lymphocytes Relative: 13 %
Lymphs Abs: 1.4 10*3/uL (ref 0.7–4.0)
MCH: 29.1 pg (ref 26.0–34.0)
MCHC: 30.9 g/dL (ref 30.0–36.0)
MCV: 94.4 fL (ref 80.0–100.0)
Monocytes Absolute: 1.8 10*3/uL — ABNORMAL HIGH (ref 0.1–1.0)
Monocytes Relative: 16 %
Neutro Abs: 7.6 10*3/uL (ref 1.7–7.7)
Neutrophils Relative %: 67 %
Platelets: 466 10*3/uL — ABNORMAL HIGH (ref 150–400)
RBC: 3.02 MIL/uL — ABNORMAL LOW (ref 4.22–5.81)
RDW: 21.2 % — ABNORMAL HIGH (ref 11.5–15.5)
WBC: 11.3 10*3/uL — ABNORMAL HIGH (ref 4.0–10.5)
nRBC: 0 % (ref 0.0–0.2)

## 2020-09-17 LAB — C-REACTIVE PROTEIN: CRP: 11.2 mg/dL — ABNORMAL HIGH (ref <1.0)

## 2020-09-17 SURGERY — ABDOMINAL AORTOGRAM W/LOWER EXTREMITY
Anesthesia: LOCAL

## 2020-09-17 MED ORDER — SODIUM CHLORIDE 0.9 % IV SOLN: INTRAVENOUS | Status: DC

## 2020-09-17 MED ORDER — ENOXAPARIN SODIUM 40 MG/0.4ML ~~LOC~~ SOLN
40.0000 mg | SUBCUTANEOUS | Status: DC
  Administered 2020-09-18 – 2020-09-26 (×8): 40 mg via SUBCUTANEOUS
  Filled 2020-09-17 (×8): qty 0.4

## 2020-09-17 NOTE — Progress Notes (Signed)
PROGRESS NOTE    Seth [name]  RKY:[id number] DOB: [birth year] DOA: 09/14/2020 PCP: Lucianne Lei, MD   Brief Narrative:  Seth [name] is a 65 y.o. male with history of alcohol abuse, hypertension and COPD was found to be confused while patient was shopping and was brought to the ER.  At the time of my exam patient has become more alert awake.  He states he has noticed that his left foot swelling with some ulceration on the distal phalanx for the last 1 month denies any trauma or insect bites.  Denies taking any medications.  Admits to drinking alcohol daily.  In the ER patient was confusion initially CT head was unremarkable.  Patient was febrile with temperature of 102 F x-rays of the left foot shows features concerning for osteomyelitis of the distal phalanx.  Patient had blood cultures drawn and started on empiric antibiotics.  Admitted for acute encephalopathy and possible developing sepsis from osteomyelitis cellulitis of the left foot.  COVID test was positive but patient was not hypoxic.  Labs show hemoglobin of 10.  Lactic acid was 2 which improved to 1.9.  Assessment & Plan:   Principal Problem:   Sepsis ([place]) Active Problems:   Chronic alcoholism ([place])   COVID-19 virus infection   Sepsis from left foot osteomyelitis and cellulitis.   - MRI: Focal area of ulceration overlying the first digit with evidence of osteomyelitis of the distal first phalanx. No loculated fluid collection..   - Cefepime/flagyl/vanc ongoing (ceftriaxone x1 in ED) - Orthopedics following - we appreciate insight/recs - likely need amputation; ABI abnormal - Vascular following - likely angioplasty 09/17/20 per discussion to secure proper blood flow prior to amputation to ensure adequate blood flow/healing - Follow blood cultures - currently negative  COVID-19 infection -presently asymptomatic.   - Unclear if acute or incidentally positive from older infection - Not hypoxic and chest x-ray unremarkable.    - CRP elevated but likely secondary to above sepsis/infection  Alcohol abuse on CIWA protocol.   - Advised about quitting.  Social work consult. - Not currently scoring on CIWA protocol  Acute encephalopathy  - Secondary to sepsis; resolved with supportive care  COPD, not in acute exacerbation  HTN No home meds - likely controlled   DVT prophylaxis: SQ Heparin. Code Status: Full code. Family Communication: None  Status is: Inpt  Dispo: The patient is from: Home              Anticipated d/c is to: TBD              Anticipated d/c date is: >72h              Patient currently NOT medically stable for discharge  Consultants:   Ortho  Vascular  Procedures:   Pending evaluation  Antimicrobials:  Cefepime/flagyl/vanc 09/14/20 - ongoing Ceftriaxone x1 in ED on 09/14/20   Subjective: No acute issues/events overnight - denies shortness of breath, headache, fevers, chills, pain, nausea, or vomiting.  Objective: Vitals:   09/16/20 1603 09/16/20 2000 09/16/20 2319 09/17/20 0500  BP: 138/88 (!) 150/92 (!) 142/89   Pulse: (!) 109 100 100   Resp: 18 (!) 21 (!) 21   Temp: 98.6 F (37 C) 98.6 F (37 C) 98.6 F (37 C)   TempSrc: Oral Oral Oral   SpO2: 100% 100% 100%   Weight:    67 kg  Height:        Intake/Output Summary (Last 24 hours) at 09/17/2020 [phone]  Last data filed at 09/17/2020 0557 Gross per 24 hour  Intake 1194.06 ml  Output 900 ml  Net 294.06 ml   Filed Weights   09/14/20 1505 09/17/20 0500  Weight: 63.5 kg 67 kg    Examination:  General exam: Appears calm and comfortable  Respiratory system: Clear to auscultation. Respiratory effort normal. Cardiovascular system: S1 & S2 heard, RRR. No JVD, murmurs, rubs, gallops or clicks. No pedal edema. Gastrointestinal system: Abdomen is nondistended, soft and nontender. No organomegaly or masses felt. Normal bowel sounds heard. Central nervous system: Alert and oriented. No focal neurological  deficits. Extremities: Symmetric 5 x 5 power. Skin: Necrotic appearing ulceration of L foot with foul odor Psychiatry: Judgement and insight appear normal. Mood & affect appropriate.     Data Reviewed: I have personally reviewed following labs and imaging studies  CBC: Recent Labs  Lab 09/14/20 1508 09/15/20 0820 09/16/20 0552 09/17/20 0157  WBC 13.7* 12.6* 12.2* 11.3*  NEUTROABS 10.5* 9.2* 9.0* 7.6  HGB 10.0* 8.5* 8.6* 8.8*  HCT 32.9* 26.8* 26.9* 28.5*  MCV 96.2 95.7 94.4 94.4  PLT 507* 407* 450* 878*   Basic Metabolic Panel: Recent Labs  Lab 09/14/20 1508 09/15/20 0820 09/16/20 0552 09/17/20 0157  NA 135 135 137 136  K 5.0 4.0 4.3 3.9  CL 98 103 101 103  CO2 24 21* 24 19*  GLUCOSE 104* 86 85 85  BUN 9 9 8 8   CREATININE 1.19 1.00 1.03 0.96  CALCIUM 9.9 8.4* 9.2 9.0   GFR: Estimated Creatinine Clearance: 74.6 mL/min (by C-G formula based on SCr of 0.96 mg/dL). Liver Function Tests: Recent Labs  Lab 09/14/20 1508 09/15/20 0820 09/16/20 0552 09/17/20 0157  AST 23 17 15 15   ALT 17 10 10 10   ALKPHOS 95 60 62 62  BILITOT 0.9 0.9 0.9 0.9  PROT 7.1 5.2* 5.7* 6.1*  ALBUMIN 2.8* 2.0* 2.1* 2.3*   No results for input(s): LIPASE, AMYLASE in the last 168 hours. Recent Labs  Lab 09/15/20 0820  AMMONIA 29   Coagulation Profile: Recent Labs  Lab 09/14/20 1508  INR 1.1   Cardiac Enzymes: No results for input(s): CKTOTAL, CKMB, CKMBINDEX, TROPONINI in the last 168 hours. BNP (last 3 results) No results for input(s): PROBNP in the last 8760 hours. HbA1C: No results for input(s): HGBA1C in the last 72 hours. CBG: No results for input(s): GLUCAP in the last 168 hours. Lipid Profile: No results for input(s): CHOL, HDL, LDLCALC, TRIG, CHOLHDL, LDLDIRECT in the last 72 hours. Thyroid Function Tests: No results for input(s): TSH, T4TOTAL, FREET4, T3FREE, THYROIDAB in the last 72 hours. Anemia Panel: No results for input(s): VITAMINB12, FOLATE, FERRITIN, TIBC,  IRON, RETICCTPCT in the last 72 hours. Sepsis Labs: Recent Labs  Lab 09/14/20 1530 09/14/20 1708 09/15/20 0820  PROCALCITON  --   --  0.14  LATICACIDVEN 2.0* 1.9 1.3    Recent Results (from the past 240 hour(s))  Culture, blood (Routine x 2)     Status: None (Preliminary result)   Collection Time: 09/14/20  3:08 PM   Specimen: BLOOD  Result Value Ref Range Status   Specimen Description BLOOD SITE NOT SPECIFIED  Final   Special Requests   Final    BOTTLES DRAWN AEROBIC AND ANAEROBIC Blood Culture results may not be optimal due to an inadequate volume of blood received in culture bottles   Culture   Final    NO GROWTH 2 DAYS Performed at [facility], [place]  [address], [place], [place] [address]    Report Status PENDING  Incomplete  Resp Panel by RT-PCR (Flu A&B, Covid) Nasopharyngeal Swab     Status: Abnormal   Collection Time: 09/14/20  3:12 PM   Specimen: Nasopharyngeal Swab; Nasopharyngeal(NP) swabs in vial transport medium  Result Value Ref Range Status   SARS Coronavirus 2 by RT PCR POSITIVE (A) NEGATIVE Final    Comment: RESULT CALLED TO, READ BACK BY AND VERIFIED WITH: K PATE RN 1630 09/14/20 A BROWNING (NOTE) SARS-CoV-2 target nucleic acids are DETECTED.  The SARS-CoV-2 RNA is generally detectable in upper respiratory specimens during the acute phase of infection. Positive results are indicative of the presence of the identified virus, but do not rule out bacterial infection or co-infection with other pathogens not detected by the test. Clinical correlation with patient history and other diagnostic information is necessary to determine patient infection status. The expected result is Negative.  Fact Sheet for Patients: EntrepreneurPulse.com.au  Fact Sheet for Healthcare Providers: IncredibleEmployment.be  This test is not yet approved or cleared by the [place] FDA and  has been authorized for detection and/or  diagnosis of SARS-CoV-2 by FDA under an Emergency Use Authorization (EUA).  This EUA will remain in effect (meaning this test can be  used) for the duration of  the COVID-19 declaration under Section 564(b)(1) of the Act, 21 U.S.C. section 360bbb-3(b)(1), unless the authorization is terminated or revoked sooner.     Influenza A by PCR NEGATIVE NEGATIVE Final   Influenza B by PCR NEGATIVE NEGATIVE Final    Comment: (NOTE) The Xpert Xpress SARS-CoV-2/FLU/RSV plus assay is intended as an aid in the diagnosis of influenza from Nasopharyngeal swab specimens and should not be used as a sole basis for treatment. Nasal washings and aspirates are unacceptable for Xpert Xpress SARS-CoV-2/FLU/RSV testing.  Fact Sheet for Patients: EntrepreneurPulse.com.au  Fact Sheet for Healthcare Providers: IncredibleEmployment.be  This test is not yet approved or cleared by the [place] FDA and has been authorized for detection and/or diagnosis of SARS-CoV-2 by FDA under an Emergency Use Authorization (EUA). This EUA will remain in effect (meaning this test can be used) for the duration of the COVID-19 declaration under Section 564(b)(1) of the Act, 21 U.S.C. section 360bbb-3(b)(1), unless the authorization is terminated or revoked.  Performed at [facility], [place] [address], [place], [place] [address]   Culture, blood (Routine x 2)     Status: None (Preliminary result)   Collection Time: 09/14/20  7:31 PM   Specimen: BLOOD LEFT HAND  Result Value Ref Range Status   Specimen Description BLOOD LEFT HAND  Final   Special Requests   Final    BOTTLES DRAWN AEROBIC ONLY Blood Culture results may not be optimal due to an inadequate volume of blood received in culture bottles   Culture   Final    NO GROWTH 2 DAYS Performed at [facility], [place] [address], [place], [place] [address]    Report Status PENDING  Incomplete  Urine culture     Status: None    Collection Time: 09/14/20 10:38 PM   Specimen: In/Out Cath Urine  Result Value Ref Range Status   Specimen Description IN/OUT CATH URINE  Final   Special Requests NONE  Final   Culture   Final    NO GROWTH Performed at [facility], [place] [address], [place], [place] [address]    Report Status 09/16/2020 FINAL  Final  Radiology Studies: VAS [place] ABI WITH/WO TBI  Result Date: 09/16/2020 LOWER EXTREMITY DOPPLER STUDY Indications: Ulceration. High Risk Factors: Hypertension.  Comparison Study: No prior studies. Performing Technologist: Carlos Levering RVT  Examination Guidelines: A complete evaluation includes at minimum, Doppler waveform signals and systolic blood pressure reading at the level of bilateral brachial, anterior tibial, and posterior tibial arteries, when vessel segments are accessible. Bilateral testing is considered an integral part of a complete examination. Photoelectric Plethysmograph (PPG) waveforms and toe systolic pressure readings are included as required and additional duplex testing as needed. Limited examinations for reoccurring indications may be performed as noted.  ABI Findings: +---------+------------------+-----+----------+--------+ Right    Rt Pressure (mmHg)IndexWaveform  Comment  +---------+------------------+-----+----------+--------+ Brachial 147                    triphasic          +---------+------------------+-----+----------+--------+ PTA      135               0.92 biphasic           +---------+------------------+-----+----------+--------+ DP       78                0.53 monophasic         +---------+------------------+-----+----------+--------+ Great Toe61                0.41                    +---------+------------------+-----+----------+--------+ +---------+------------------+-----+----------+-------+ Left     Lt Pressure (mmHg)IndexWaveform  Comment +---------+------------------+-----+----------+-------+  Brachial 142                    triphasic         +---------+------------------+-----+----------+-------+ PTA      68                0.46 monophasic        +---------+------------------+-----+----------+-------+ DP       89                0.61 monophasic        +---------+------------------+-----+----------+-------+ Great Toe                                 Wound   +---------+------------------+-----+----------+-------+ +-------+-----------+-----------+------------+------------+ ABI/TBIToday's ABIToday's TBIPrevious ABIPrevious TBI +-------+-----------+-----------+------------+------------+ Right  0.92       0.41                                +-------+-----------+-----------+------------+------------+ Left   0.61                                           +-------+-----------+-----------+------------+------------+  Summary: Right: Resting right ankle-brachial index indicates mild right lower extremity arterial disease. The right toe-brachial index is abnormal. Left: Resting left ankle-brachial index indicates moderate left lower extremity arterial disease. Unable to obtain TBI due to great toe ulcer.  *See table(s) above for measurements and observations.     Preliminary    Scheduled Meds: . folic acid  1 mg Oral Daily  . heparin  5,000 Units Subcutaneous Q8H  . LORazepam  0-4 mg Oral Q12H  . metroNIDAZOLE  500 mg Oral Q8H  . multivitamin with minerals  1  tablet Oral Daily  . thiamine  100 mg Oral Daily   Or  . thiamine  100 mg Intravenous Daily   Continuous Infusions: . ceFEPime (MAXIPIME) IV 2 g (09/17/20 0554)  . vancomycin Stopped (09/16/20 2100)     LOS: 3 days   Time spent: 61min  Harrel Ferrone C Rhaya Coale, DO [facility]  If 7PM-7AM, please contact night-coverage [email or web address]  09/17/2020, 8:12 AM

## 2020-09-17 NOTE — H&P (View-Only) (Signed)
REASON FOR CONSULT:    Critical limb ischemia left lower extremity.  The consult is requested by Dr. 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  This is clearly a limb threatening problem.  Without revascularization I do not think he would heal a transmetatarsal amputation.  For this reason I have recommended arteriography.  I have reviewed with the patient the indications for arteriography. In addition, I have reviewed the potential complications of arteriography including but not limited to: Bleeding, arterial injury, arterial thrombosis, dye action, renal insufficiency, or other unpredictable medical problems. I have explained to the patient that if we find disease amenable to angioplasty we could potentially address this at the same time. I have discussed the potential complications of angioplasty and stenting, including but not limited to: Bleeding, arterial thrombosis, arterial injury, dissection, or the need for surgical intervention.  The situation is further complicated by his COVID.  We will have to be sure that he has a postop bed before we can proceed.  I would like to try to proceed today so I have made him n.p.o.   [name], MD Office: [phone]   HPI:   Seth [name] is a pleasant 65 y.o. male, who was admitted from home with confusion on 09/14/2020.  The patient was found to be COVID-positive.  He had gangrenous wounds of his left foot.  He was evaluated by Dr. Meridee Score.  He had evidence of infrainguinal arterial occlusive disease and vascular surgery was consulted as as it was felt he would not heal a transmetatarsal amputation without revascularization.  On my history the patient denies any history of claudication or rest pain.  He states that he had the wounds on his toes for many weeks.  He does not  remember any specific injury.  His risk factors for peripheral vascular disease include hypertension and tobacco use.  He smokes a pack per day of cigarettes.  He thinks he has been smoking for about 10 years.  He denies any history of diabetes, hypercholesterolemia, or family history of premature cardiovascular disease.  Past Medical History:  Diagnosis Date  . Depression   . Enlarged prostate   . Gout   . Hypertension   . Metabolic acidosis 30/86/5784  . Stroke [facility])     Family History  Problem Relation Age of Onset  . Hypertension Mother   . Hypertension Father     SOCIAL HISTORY: Social History   Socioeconomic History  . Marital status: Legally Separated    Spouse name: Not on file  . Number of children: 3  . Years of education: Not on file  . Highest education level: 11th grade  Occupational History  . Occupation: disabled (informally) from Scientist, research (physical sciences) cutting work  Tobacco Use  . Smoking status: Current Every Day Smoker    Packs/day: 1.00    Years: 42.00    Pack years: 42.00    Types: Cigarettes  . Smokeless tobacco: Never Used  Vaping Use  . Vaping Use: Never used  Substance and Sexual Activity  . Alcohol use: Yes    Comment: 1/2 pint vodka/day, last drank last night  . Drug use: Yes    Types: Marijuana    Comment: last used 1 month ago  . Sexual activity: Yes  Other Topics Concern  . Not on file  Social History Narrative  Lives alone.   Has his mother and father as daughters check in on him regularly.   Does not get out of the house much other than fishing--friend picks him up and they go to [employer].-- and cutting the lawn.   Social Determinants of Health   Financial Resource Strain: Not on file  Food Insecurity: Not on file  Transportation Needs: Not on file  Physical Activity: Not on file  Stress: Not on file  Social Connections: Not on file  Intimate Partner Violence: Not on file    No Known Allergies  Current Facility-Administered  Medications  Medication Dose Route Frequency Provider Last Rate Last Admin  . acetaminophen (TYLENOL) tablet 650 mg  650 mg Oral Q6H PRN Rise Patience, MD   650 mg at 09/15/20 1340   Or  . acetaminophen (TYLENOL) suppository 650 mg  650 mg Rectal Q6H PRN Rise Patience, MD      . ceFEPIme (MAXIPIME) 2 g in sodium chloride 0.9 % 100 mL IVPB  2 g Intravenous Q8H Pham, Minh Q, RPH-CPP 200 mL/hr at 09/17/20 0554 2 g at 09/17/20 0554  . folic acid (FOLVITE) tablet 1 mg  1 mg Oral Daily Rise Patience, MD   1 mg at 09/16/20 0927  . heparin injection 5,000 Units  5,000 Units Subcutaneous Q8H Rise Patience, MD   5,000 Units at 09/17/20 0554  . hydrALAZINE (APRESOLINE) injection 10 mg  10 mg Intravenous Q4H PRN Rise Patience, MD      . LORazepam (ATIVAN) tablet 1-4 mg  1-4 mg Oral Q1H PRN Rise Patience, MD       Or  . LORazepam (ATIVAN) injection 1-4 mg  1-4 mg Intravenous Q1H PRN Rise Patience, MD      . LORazepam (ATIVAN) tablet 0-4 mg  0-4 mg Oral Q12H Rise Patience, MD      . metroNIDAZOLE (FLAGYL) tablet 500 mg  500 mg Oral Q8H Pham, Minh Q, RPH-CPP   500 mg at 09/17/20 0555  . multivitamin with minerals tablet 1 tablet  1 tablet Oral Daily Rise Patience, MD   1 tablet at 09/16/20 [id number]  . thiamine tablet 100 mg  100 mg Oral Daily Rise Patience, MD   100 mg at 09/16/20 [id number]   Or  . thiamine (B-1) injection 100 mg  100 mg Intravenous Daily Rise Patience, MD      . vancomycin Alcus Dad) IVPB 1250 mg/250 mL  1,250 mg Intravenous Q24H Bertis Ruddy, [facility]   Stopped at 09/16/20 2100    REVIEW OF SYSTEMS:  [X]  denotes positive finding, [ ]  denotes negative finding Cardiac  Comments:  Chest pain or chest pressure:    Shortness of breath upon exertion:    Short of breath when lying flat:    Irregular heart rhythm:        Vascular    Pain in calf, thigh, or hip brought on by ambulation:    Pain in feet at night that wakes  you up from your sleep:     Blood clot in your veins:    Leg swelling:         Pulmonary    Oxygen at home:    Productive cough:     Wheezing:         Neurologic    Sudden weakness in arms or legs:     Sudden numbness in arms or legs:     Sudden onset of difficulty  speaking or slurred speech:    Temporary loss of vision in one eye:     Problems with dizziness:         Gastrointestinal    Blood in stool:     Vomited blood:         Genitourinary    Burning when urinating:     Blood in urine:        Psychiatric    Major depression:         Hematologic    Bleeding problems:    Problems with blood clotting too easily:        Skin    Rashes or ulcers: x  toes left foot      Constitutional    Fever or chills:     PHYSICAL EXAM:   Vitals:   09/16/20 1603 09/16/20 2000 09/16/20 2319 09/17/20 0500  BP: 138/88 (!) 150/92 (!) 142/89   Pulse: (!) 109 100 100   Resp: 18 (!) 21 (!) 21   Temp: 98.6 F (37 C) 98.6 F (37 C) 98.6 F (37 C)   TempSrc: Oral Oral Oral   SpO2: 100% 100% 100%   Weight:    67 kg  Height:        GENERAL: The patient is a well-nourished male, in no acute distress. The vital signs are documented above. CARDIAC: There is a regular rate and rhythm.  VASCULAR: I do not detect carotid bruits. On the left side, which is the side of concern, he has a palpable femoral pulse.  I cannot palpate a popliteal or pedal pulses. On the right side he has a palpable femoral, popliteal, and dorsalis pedis pulse. PULMONARY: There is good air exchange bilaterally without wheezing or rales. ABDOMEN: Soft and non-tender with normal pitched bowel sounds.  MUSCULOSKELETAL: There are no major deformities or cyanosis. NEUROLOGIC: No focal weakness or paresthesias are detected. SKIN: He has gangrenous wounds of the first second third and fifth toes with a foul odor.    PSYCHIATRIC: The patient has a normal affect.  DATA:    LABS: His GFR is greater than 60.   Creatinine 0.96.  Hemoglobin 8.8.  Platelets 466,000.  ARTERIAL DOPPLER STUDY: I have independently interpreted his arterial Doppler study today.  On the left side, which is the site of concern, there is a monophasic dorsalis pedis and posterior tibial signal.  ABI 61%.  On the right side there is a biphasic posterior tibial signal with a monophasic dorsalis pedis signal.  ABIs 92%.  Toe pressure on the right is 61 mmHg.

## 2020-09-17 NOTE — Consult Note (Signed)
REASON FOR CONSULT:    Critical limb ischemia left lower extremity.  The consult is requested by Dr. 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  This is clearly a limb threatening problem.  Without revascularization I do not think he would heal a transmetatarsal amputation.  For this reason I have recommended arteriography.  I have reviewed with the patient the indications for arteriography. In addition, I have reviewed the potential complications of arteriography including but not limited to: Bleeding, arterial injury, arterial thrombosis, dye action, renal insufficiency, or other unpredictable medical problems. I have explained to the patient that if we find disease amenable to angioplasty we could potentially address this at the same time. I have discussed the potential complications of angioplasty and stenting, including but not limited to: Bleeding, arterial thrombosis, arterial injury, dissection, or the need for surgical intervention.  The situation is further complicated by his COVID.  We will have to be sure that he has a postop bed before we can proceed.  I would like to try to proceed today so I have made him n.p.o.   Seth Mayo, MD Office: [phone]   HPI:   Seth [name] is a pleasant 65 y.o. male, who was admitted from home with confusion on 09/14/2020.  The patient was found to be COVID-positive.  He had gangrenous wounds of his left foot.  He was evaluated by Dr. Meridee [name].  He had evidence of infrainguinal arterial occlusive disease and vascular surgery was consulted as as it was felt he would not heal a transmetatarsal amputation without revascularization.  On my history the patient denies any history of claudication or rest pain.  He states that he had the wounds on his toes for many weeks.  He does not  remember any specific injury.  His risk factors for peripheral vascular disease include hypertension and tobacco use.  He smokes a pack per day of cigarettes.  He thinks he has been smoking for about 10 years.  He denies any history of diabetes, hypercholesterolemia, or family history of premature cardiovascular disease.  Past Medical History:  Diagnosis Date  . Depression   . Enlarged prostate   . Gout   . Hypertension   . Metabolic acidosis 30/86/5784  . Stroke [facility])     Family History  Problem Relation Age of Onset  . Hypertension Mother   . Hypertension Father     SOCIAL HISTORY: Social History   Socioeconomic History  . Marital status: Legally Separated    Spouse name: Not on file  . Number of children: 3  . Years of education: Not on file  . Highest education level: 11th grade  Occupational History  . Occupation: disabled (informally) from Scientist, research (physical sciences) cutting work  Tobacco Use  . Smoking status: Current Every Day Smoker    Packs/day: 1.00    Years: 42.00    Pack years: 42.00    Types: Cigarettes  . Smokeless tobacco: Never Used  Vaping Use  . Vaping Use: Never used  Substance and Sexual Activity  . Alcohol use: Yes    Comment: 1/2 pint vodka/day, last drank last night  . Drug use: Yes    Types: Marijuana    Comment: last used 1 month ago  . Sexual activity: Yes  Other Topics Concern  . Not on file  Social History Narrative  Lives alone.   Has his mother and father as daughters check in on him regularly.   Does not get out of the house much other than fishing--friend picks him up and they go to [employer].-- and cutting the lawn.   Social Determinants of Health   Financial Resource Strain: Not on file  Food Insecurity: Not on file  Transportation Needs: Not on file  Physical Activity: Not on file  Stress: Not on file  Social Connections: Not on file  Intimate Partner Violence: Not on file    No Known Allergies  Current Facility-Administered  Medications  Medication Dose Route Frequency Provider Last Rate Last Admin  . acetaminophen (TYLENOL) tablet 650 mg  650 mg Oral Q6H PRN Rise Patience, MD   650 mg at 09/15/20 1340   Or  . acetaminophen (TYLENOL) suppository 650 mg  650 mg Rectal Q6H PRN Rise Patience, MD      . ceFEPIme (MAXIPIME) 2 g in sodium chloride 0.9 % 100 mL IVPB  2 g Intravenous Q8H Pham, Minh Q, RPH-CPP 200 mL/hr at 09/17/20 0554 2 g at 09/17/20 0554  . folic acid (FOLVITE) tablet 1 mg  1 mg Oral Daily Rise Patience, MD   1 mg at 09/16/20 0927  . heparin injection 5,000 Units  5,000 Units Subcutaneous Q8H Rise Patience, MD   5,000 Units at 09/17/20 0554  . hydrALAZINE (APRESOLINE) injection 10 mg  10 mg Intravenous Q4H PRN Rise Patience, MD      . LORazepam (ATIVAN) tablet 1-4 mg  1-4 mg Oral Q1H PRN Rise Patience, MD       Or  . LORazepam (ATIVAN) injection 1-4 mg  1-4 mg Intravenous Q1H PRN Rise Patience, MD      . LORazepam (ATIVAN) tablet 0-4 mg  0-4 mg Oral Q12H Rise Patience, MD      . metroNIDAZOLE (FLAGYL) tablet 500 mg  500 mg Oral Q8H Pham, Minh Q, RPH-CPP   500 mg at 09/17/20 0555  . multivitamin with minerals tablet 1 tablet  1 tablet Oral Daily Rise Patience, MD   1 tablet at 09/16/20 [id number]  . thiamine tablet 100 mg  100 mg Oral Daily Rise Patience, MD   100 mg at 09/16/20 [id number]   Or  . thiamine (B-1) injection 100 mg  100 mg Intravenous Daily Rise Patience, MD      . vancomycin Alcus Dad) IVPB 1250 mg/250 mL  1,250 mg Intravenous Q24H Bertis Ruddy, [facility]   Stopped at 09/16/20 2100    REVIEW OF SYSTEMS:  [X]  denotes positive finding, [ ]  denotes negative finding Cardiac  Comments:  Chest pain or chest pressure:    Shortness of breath upon exertion:    Short of breath when lying flat:    Irregular heart rhythm:        Vascular    Pain in calf, thigh, or hip brought on by ambulation:    Pain in feet at night that wakes  you up from your sleep:     Blood clot in your veins:    Leg swelling:         Pulmonary    Oxygen at home:    Productive cough:     Wheezing:         Neurologic    Sudden weakness in arms or legs:     Sudden numbness in arms or legs:     Sudden onset of difficulty  speaking or slurred speech:    Temporary loss of vision in one eye:     Problems with dizziness:         Gastrointestinal    Blood in stool:     Vomited blood:         Genitourinary    Burning when urinating:     Blood in urine:        Psychiatric    Major depression:         Hematologic    Bleeding problems:    Problems with blood clotting too easily:        Skin    Rashes or ulcers: x  toes left foot      Constitutional    Fever or chills:     PHYSICAL EXAM:   Vitals:   09/16/20 1603 09/16/20 2000 09/16/20 2319 09/17/20 0500  BP: 138/88 (!) 150/92 (!) 142/89   Pulse: (!) 109 100 100   Resp: 18 (!) 21 (!) 21   Temp: 98.6 F (37 C) 98.6 F (37 C) 98.6 F (37 C)   TempSrc: Oral Oral Oral   SpO2: 100% 100% 100%   Weight:    67 kg  Height:        GENERAL: The patient is a well-nourished male, in no acute distress. The vital signs are documented above. CARDIAC: There is a regular rate and rhythm.  VASCULAR: I do not detect carotid bruits. On the left side, which is the side of concern, he has a palpable femoral pulse.  I cannot palpate a popliteal or pedal pulses. On the right side he has a palpable femoral, popliteal, and dorsalis pedis pulse. PULMONARY: There is good air exchange bilaterally without wheezing or rales. ABDOMEN: Soft and non-tender with normal pitched bowel sounds.  MUSCULOSKELETAL: There are no major deformities or cyanosis. NEUROLOGIC: No focal weakness or paresthesias are detected. SKIN: He has gangrenous wounds of the first second third and fifth toes with a foul odor.    PSYCHIATRIC: The patient has a normal affect.  DATA:    LABS: His GFR is greater than 60.   Creatinine 0.96.  Hemoglobin 8.8.  Platelets 466,000.  ARTERIAL DOPPLER STUDY: I have independently interpreted his arterial Doppler study today.  On the left side, which is the site of concern, there is a monophasic dorsalis pedis and posterior tibial signal.  ABI 61%.  On the right side there is a biphasic posterior tibial signal with a monophasic dorsalis pedis signal.  ABIs 92%.  Toe pressure on the right is 61 mmHg.

## 2020-09-18 DIAGNOSIS — A419 Sepsis, unspecified organism: Secondary | ICD-10-CM | POA: Diagnosis not present

## 2020-09-18 LAB — CBC WITH DIFFERENTIAL/PLATELET
Abs Immature Granulocytes: 0.06 10*3/uL (ref 0.00–0.07)
Basophils Absolute: 0 10*3/uL (ref 0.0–0.1)
Basophils Relative: 0 %
Eosinophils Absolute: 0.2 10*3/uL (ref 0.0–0.5)
Eosinophils Relative: 3 %
HCT: 24.6 % — ABNORMAL LOW (ref 39.0–52.0)
Hemoglobin: 7.9 g/dL — ABNORMAL LOW (ref 13.0–17.0)
Immature Granulocytes: 1 %
Lymphocytes Relative: 15 %
Lymphs Abs: 1.2 10*3/uL (ref 0.7–4.0)
MCH: 29.4 pg (ref 26.0–34.0)
MCHC: 32.1 g/dL (ref 30.0–36.0)
MCV: 91.4 fL (ref 80.0–100.0)
Monocytes Absolute: 1.6 10*3/uL — ABNORMAL HIGH (ref 0.1–1.0)
Monocytes Relative: 20 %
Neutro Abs: 4.8 10*3/uL (ref 1.7–7.7)
Neutrophils Relative %: 61 %
Platelets: 430 10*3/uL — ABNORMAL HIGH (ref 150–400)
RBC: 2.69 MIL/uL — ABNORMAL LOW (ref 4.22–5.81)
RDW: 21.2 % — ABNORMAL HIGH (ref 11.5–15.5)
WBC: 8 10*3/uL (ref 4.0–10.5)
nRBC: 0 % (ref 0.0–0.2)

## 2020-09-18 LAB — COMPREHENSIVE METABOLIC PANEL
ALT: 8 U/L (ref 0–44)
AST: 10 U/L — ABNORMAL LOW (ref 15–41)
Albumin: 2 g/dL — ABNORMAL LOW (ref 3.5–5.0)
Alkaline Phosphatase: 52 U/L (ref 38–126)
Anion gap: 11 (ref 5–15)
BUN: 7 mg/dL — ABNORMAL LOW (ref 8–23)
CO2: 19 mmol/L — ABNORMAL LOW (ref 22–32)
Calcium: 8.5 mg/dL — ABNORMAL LOW (ref 8.9–10.3)
Chloride: 108 mmol/L (ref 98–111)
Creatinine, Ser: 0.86 mg/dL (ref 0.61–1.24)
GFR, Estimated: >60 mL/min (ref >60)
Glucose, Bld: 90 mg/dL (ref 70–99)
Potassium: 3.4 mmol/L — ABNORMAL LOW (ref 3.5–5.1)
Sodium: 138 mmol/L (ref 135–145)
Total Bilirubin: 0.5 mg/dL (ref 0.3–1.2)
Total Protein: 5.2 g/dL — ABNORMAL LOW (ref 6.5–8.1)

## 2020-09-18 LAB — C-REACTIVE PROTEIN: CRP: 8.4 mg/dL — ABNORMAL HIGH (ref <1.0)

## 2020-09-18 LAB — D-DIMER, QUANTITATIVE: D-Dimer, Quant: 0.51 ug/mL-FEU — ABNORMAL HIGH (ref 0.00–0.50)

## 2020-09-18 MED ORDER — VANCOMYCIN HCL IN DEXTROSE 1-5 GM/200ML-% IV SOLN
1000.0000 mg | Freq: Two times a day (BID) | INTRAVENOUS | Status: DC
  Administered 2020-09-18 – 2020-09-23 (×10): 1000 mg via INTRAVENOUS
  Filled 2020-09-18 (×14): qty 200

## 2020-09-18 NOTE — Progress Notes (Signed)
VASCULAR SURGERY:  We were unable to do his arteriogram yesterday as there was no place for a COVID-positive patient to go after the procedure.  Therefore we will try to schedule him for an arteriogram Monday or Tuesday this coming week.  [name], MD Office: [phone]

## 2020-09-18 NOTE — Plan of Care (Signed)
  Problem: Education: Goal: Knowledge of General Education information will improve Description: Including pain rating scale, medication(s)/side effects and non-pharmacologic comfort measures Outcome: Progressing   Problem: Health Behavior/Discharge Planning: Goal: Ability to manage health-related needs will improve Outcome: Progressing   Problem: Clinical Measurements: Goal: Ability to maintain clinical measurements within normal limits will improve Outcome: Progressing Goal: Will remain free from infection Outcome: Progressing Goal: Diagnostic test results will improve Outcome: Progressing Goal: Respiratory complications will improve Outcome: Progressing Goal: Cardiovascular complication will be avoided Outcome: Progressing   Problem: Activity: Goal: Risk for activity intolerance will decrease Outcome: Progressing   Problem: Nutrition: Goal: Adequate nutrition will be maintained Outcome: Progressing   Problem: Safety: Goal: Ability to remain free from injury will improve Outcome: Progressing   

## 2020-09-18 NOTE — Progress Notes (Signed)
PHARMACY NOTE:  ANTIMICROBIAL RENAL DOSAGE ADJUSTMENT  Current antimicrobial regimen includes a mismatch between antimicrobial dosage and estimated renal function.  As per policy approved by the Pharmacy & Therapeutics and Medical Executive Committees, the antimicrobial dosage will be adjusted accordingly.  Current antimicrobial dosage:  1500mg  Q24H  Indication: Cellulitis  Renal Function: CrCl 84, Scr 0.86  Estimated Creatinine Clearance: 84.3 mL/min (by C-G formula based on SCr of 0.86 mg/dL). []      On intermittent HD, scheduled: []      On CRRT    Antimicrobial dosage has been changed to:  1000mg  Q12H Additional comments: Pharmacist anticipated to change therapy on 1/20 but was not changed at that time. Current Calculated AUC is 483. Consider levels if MD wishes to continue vancomycin therapy. Plan for vascular intervention early next week.   Thank you for allowing pharmacy to be a part of this patient's care.  Cephus Slater, PharmD, [facility] Resident [phone] 09/18/2020 12:46 PM

## 2020-09-18 NOTE — Progress Notes (Signed)
PROGRESS NOTE    Seth [name]  [id number] DOB: [birth year] DOA: 09/14/2020 PCP: Lucianne Lei, MD   Brief Narrative:  Seth [name] is a 65 y.o. male with history of alcohol abuse, hypertension and COPD was found to be confused while patient was shopping and was brought to the ER.  At the time of my exam patient has become more alert awake.  He states he has noticed that his left foot swelling with some ulceration on the distal phalanx for the last 1 month denies any trauma or insect bites.  Denies taking any medications.  Admits to drinking alcohol daily.  In the ER patient was confusion initially CT head was unremarkable.  Patient was febrile with temperature of 102 F x-rays of the left foot shows features concerning for osteomyelitis of the distal phalanx.  Patient had blood cultures drawn and started on empiric antibiotics.  Admitted for acute encephalopathy and possible developing sepsis from osteomyelitis cellulitis of the left foot.  COVID test was positive but patient was not hypoxic.  Labs show hemoglobin of 10.  Lactic acid was 2 which improved to 1.9.  Assessment & Plan:   Principal Problem:   Sepsis ([place]) Active Problems:   Chronic alcoholism ([place])   COVID-19 virus infection   Sepsis from left foot osteomyelitis and cellulitis.   - MRI: Focal area of ulceration overlying the first digit with evidence of osteomyelitis of the distal first phalanx. No loculated fluid collection..   - Cefepime/flagyl/vanc ongoing (ceftriaxone x1 in ED) - Orthopedics following - we appreciate insight/recs - likely need amputation; ABI abnormal - Vascular unable to perform angioplasty 09/17/20 - will have to wait until next week per documentation - Follow blood cultures - currently negative  COVID-19 infection -presently asymptomatic.   - Unclear if acute or incidentally positive from older infection - Not hypoxic and chest x-ray unremarkable.   - CRP elevated but likely secondary to above  sepsis/infection  Alcohol abuse on CIWA protocol.   - Advised about quitting.  Social work consult. - Not currently scoring on CIWA protocol  Acute encephalopathy  - Secondary to sepsis; resolved with supportive care  COPD, not in acute exacerbation  HTN No home meds - likely controlled   DVT prophylaxis: SQ Heparin. Code Status: Full code. Family Communication: None  Status is: Inpt  Dispo: The patient is from: Home              Anticipated d/c is to: TBD              Anticipated d/c date is: >72h              Patient currently NOT medically stable for discharge  Consultants:   Ortho  Vascular  Procedures:   Pending evaluation  Antimicrobials:  Cefepime/flagyl/vanc 09/14/20 - ongoing Ceftriaxone x1 in ED on 09/14/20   Subjective: No acute issues/events overnight - denies shortness of breath, headache, fevers, chills, pain, nausea, or vomiting.  Objective: Vitals:   09/17/20 1600 09/17/20 2000 09/18/20 0000 09/18/20 0400  BP: (!) 124/94 124/78 (!) 147/94 139/90  Pulse: 98 (!) 107 (!) 108 (!) 103  Resp: 16 20 20 20   Temp: 99 F (37.2 C) 98.4 F (36.9 C) 98.3 F (36.8 C) 98.8 F (37.1 C)  TempSrc: Oral Oral Oral Oral  SpO2: 94% 100% 100% 100%  Weight:    67.8 kg  Height:        Intake/Output Summary (Last 24 hours) at 09/18/2020 [id number] Last data  filed at 09/18/2020 0433 Gross per 24 hour  Intake 740 ml  Output 300 ml  Net 440 ml   Filed Weights   09/14/20 1505 09/17/20 0500 09/18/20 0400  Weight: 63.5 kg 67 kg 67.8 kg    Examination:  General exam: Appears calm and comfortable  Respiratory system: Clear to auscultation. Respiratory effort normal. Cardiovascular system: S1 & S2 heard, RRR. No JVD, murmurs, rubs, gallops or clicks. No pedal edema. Gastrointestinal system: Abdomen is nondistended, soft and nontender. No organomegaly or masses felt. Normal bowel sounds heard. Central nervous system: Alert and oriented. No focal neurological  deficits. Extremities: Symmetric 5 x 5 power. Skin: Necrotic appearing ulceration of L foot with foul odor Psychiatry: Judgement and insight appear normal. Mood & affect appropriate.     Data Reviewed: I have personally reviewed following labs and imaging studies  CBC: Recent Labs  Lab 09/14/20 1508 09/15/20 0820 09/16/20 0552 09/17/20 0157 09/18/20 0521  WBC 13.7* 12.6* 12.2* 11.3* 8.0  NEUTROABS 10.5* 9.2* 9.0* 7.6 4.8  HGB 10.0* 8.5* 8.6* 8.8* 7.9*  HCT 32.9* 26.8* 26.9* 28.5* 24.6*  MCV 96.2 95.7 94.4 94.4 91.4  PLT 507* 407* 450* 466* [id number]*   Basic Metabolic Panel: Recent Labs  Lab 09/14/20 1508 09/15/20 0820 09/16/20 0552 09/17/20 0157 09/18/20 0521  NA 135 135 137 136 138  K 5.0 4.0 4.3 3.9 3.4*  CL 98 103 101 103 108  CO2 24 21* 24 19* 19*  GLUCOSE 104* 86 85 85 90  BUN 9 9 8 8  7*  CREATININE 1.19 1.00 1.03 0.96 0.86  CALCIUM 9.9 8.4* 9.2 9.0 8.5*   GFR: Estimated Creatinine Clearance: 84.3 mL/min (by C-G formula based on SCr of 0.86 mg/dL). Liver Function Tests: Recent Labs  Lab 09/14/20 1508 09/15/20 0820 09/16/20 0552 09/17/20 0157 09/18/20 0521  AST 23 17 15 15  10*  ALT 17 10 10 10 8   ALKPHOS 95 60 62 62 52  BILITOT 0.9 0.9 0.9 0.9 0.5  PROT 7.1 5.2* 5.7* 6.1* 5.2*  ALBUMIN 2.8* 2.0* 2.1* 2.3* 2.0*   No results for input(s): LIPASE, AMYLASE in the last 168 hours. Recent Labs  Lab 09/15/20 0820  AMMONIA 29   Coagulation Profile: Recent Labs  Lab 09/14/20 1508  INR 1.1   Cardiac Enzymes: No results for input(s): CKTOTAL, CKMB, CKMBINDEX, TROPONINI in the last 168 hours. BNP (last 3 results) No results for input(s): PROBNP in the last 8760 hours. HbA1C: No results for input(s): HGBA1C in the last 72 hours. CBG: No results for input(s): GLUCAP in the last 168 hours. Lipid Profile: No results for input(s): CHOL, HDL, LDLCALC, TRIG, CHOLHDL, LDLDIRECT in the last 72 hours. Thyroid Function Tests: No results for input(s): TSH,  T4TOTAL, FREET4, T3FREE, THYROIDAB in the last 72 hours. Anemia Panel: No results for input(s): VITAMINB12, FOLATE, FERRITIN, TIBC, IRON, RETICCTPCT in the last 72 hours. Sepsis Labs: Recent Labs  Lab 09/14/20 1530 09/14/20 1708 09/15/20 0820  PROCALCITON  --   --  0.14  LATICACIDVEN 2.0* 1.9 1.3    Recent Results (from the past 240 hour(s))  Culture, blood (Routine x 2)     Status: None (Preliminary result)   Collection Time: 09/14/20  3:08 PM   Specimen: BLOOD  Result Value Ref Range Status   Specimen Description BLOOD SITE NOT SPECIFIED  Final   Special Requests   Final    BOTTLES DRAWN AEROBIC AND ANAEROBIC Blood Culture results may not be optimal due to an inadequate volume  of blood received in culture bottles   Culture   Final    NO GROWTH 3 DAYS Performed at [facility], [place] [address], [place], [place] [address]    Report Status PENDING  Incomplete  Resp Panel by RT-PCR (Flu A&B, Covid) Nasopharyngeal Swab     Status: Abnormal   Collection Time: 09/14/20  3:12 PM   Specimen: Nasopharyngeal Swab; Nasopharyngeal(NP) swabs in vial transport medium  Result Value Ref Range Status   SARS Coronavirus 2 by RT PCR POSITIVE (A) NEGATIVE Final    Comment: RESULT CALLED TO, READ BACK BY AND VERIFIED WITH: K PATE RN 1630 09/14/20 A BROWNING (NOTE) SARS-CoV-2 target nucleic acids are DETECTED.  The SARS-CoV-2 RNA is generally detectable in upper respiratory specimens during the acute phase of infection. Positive results are indicative of the presence of the identified virus, but do not rule out bacterial infection or co-infection with other pathogens not detected by the test. Clinical correlation with patient history and other diagnostic information is necessary to determine patient infection status. The expected result is Negative.  Fact Sheet for Patients: EntrepreneurPulse.com.au  Fact Sheet for Healthcare  Providers: IncredibleEmployment.be  This test is not yet approved or cleared by the [place] FDA and  has been authorized for detection and/or diagnosis of SARS-CoV-2 by FDA under an Emergency Use Authorization (EUA).  This EUA will remain in effect (meaning this test can be  used) for the duration of  the COVID-19 declaration under Section 564(b)(1) of the Act, 21 U.S.C. section 360bbb-3(b)(1), unless the authorization is terminated or revoked sooner.     Influenza A by PCR NEGATIVE NEGATIVE Final   Influenza B by PCR NEGATIVE NEGATIVE Final    Comment: (NOTE) The Xpert Xpress SARS-CoV-2/FLU/RSV plus assay is intended as an aid in the diagnosis of influenza from Nasopharyngeal swab specimens and should not be used as a sole basis for treatment. Nasal washings and aspirates are unacceptable for Xpert Xpress SARS-CoV-2/FLU/RSV testing.  Fact Sheet for Patients: EntrepreneurPulse.com.au  Fact Sheet for Healthcare Providers: IncredibleEmployment.be  This test is not yet approved or cleared by the [place] FDA and has been authorized for detection and/or diagnosis of SARS-CoV-2 by FDA under an Emergency Use Authorization (EUA). This EUA will remain in effect (meaning this test can be used) for the duration of the COVID-19 declaration under Section 564(b)(1) of the Act, 21 U.S.C. section 360bbb-3(b)(1), unless the authorization is terminated or revoked.  Performed at [facility], [place] [address] [place], [place] [address]   Culture, blood (Routine x 2)     Status: None (Preliminary result)   Collection Time: 09/14/20  7:31 PM   Specimen: BLOOD LEFT HAND  Result Value Ref Range Status   Specimen Description BLOOD LEFT HAND  Final   Special Requests   Final    BOTTLES DRAWN AEROBIC ONLY Blood Culture results may not be optimal due to an inadequate volume of blood received in culture bottles   Culture   Final     NO GROWTH 3 DAYS Performed at [facility], [place] [address], [place], [place] [address]    Report Status PENDING  Incomplete  Urine culture     Status: None   Collection Time: 09/14/20 10:38 PM   Specimen: In/Out Cath Urine  Result Value Ref Range Status   Specimen Description IN/OUT CATH URINE  Final   Special Requests NONE  Final   Culture   Final    NO  GROWTH Performed at [facility], [place] [address], [place], [place] [address]    Report Status 09/16/2020 FINAL  Final         Radiology Studies: VAS [place] ABI WITH/WO TBI  Result Date: 09/17/2020 LOWER EXTREMITY DOPPLER STUDY Indications: Ulceration. High Risk Factors: Hypertension.  Comparison Study: No prior studies. Performing Technologist: Carlos Levering RVT  Examination Guidelines: A complete evaluation includes at minimum, Doppler waveform signals and systolic blood pressure reading at the level of bilateral brachial, anterior tibial, and posterior tibial arteries, when vessel segments are accessible. Bilateral testing is considered an integral part of a complete examination. Photoelectric Plethysmograph (PPG) waveforms and toe systolic pressure readings are included as required and additional duplex testing as needed. Limited examinations for reoccurring indications may be performed as noted.  ABI Findings: +---------+------------------+-----+----------+--------+ Right    Rt Pressure (mmHg)IndexWaveform  Comment  +---------+------------------+-----+----------+--------+ Brachial 147                    triphasic          +---------+------------------+-----+----------+--------+ PTA      135               0.92 biphasic           +---------+------------------+-----+----------+--------+ DP       78                0.53 monophasic         +---------+------------------+-----+----------+--------+ Great Toe61                0.41                    +---------+------------------+-----+----------+--------+  +---------+------------------+-----+----------+-------+ Left     Lt Pressure (mmHg)IndexWaveform  Comment +---------+------------------+-----+----------+-------+ Brachial 142                    triphasic         +---------+------------------+-----+----------+-------+ PTA      68                0.46 monophasic        +---------+------------------+-----+----------+-------+ DP       89                0.61 monophasic        +---------+------------------+-----+----------+-------+ Great Toe                                 Wound   +---------+------------------+-----+----------+-------+ +-------+-----------+-----------+------------+------------+ ABI/TBIToday's ABIToday's TBIPrevious ABIPrevious TBI +-------+-----------+-----------+------------+------------+ Right  0.92       0.41                                +-------+-----------+-----------+------------+------------+ Left   0.61                                           +-------+-----------+-----------+------------+------------+  Summary: Right: Resting right ankle-brachial index indicates mild right lower extremity arterial disease. The right toe-brachial index is abnormal. Left: Resting left ankle-brachial index indicates moderate left lower extremity arterial disease. Unable to obtain TBI due to great toe ulcer.  *See table(s) above for measurements and observations.  Electronically signed by [name] MD on 09/17/2020 at 3:12:20 PM.    Final  Scheduled Meds: . enoxaparin (LOVENOX) injection  40 mg Subcutaneous Q24H  . folic acid  1 mg Oral Daily  . LORazepam  0-4 mg Oral Q12H  . metroNIDAZOLE  500 mg Oral Q8H  . multivitamin with minerals  1 tablet Oral Daily  . thiamine  100 mg Oral Daily   Or  . thiamine  100 mg Intravenous Daily   Continuous Infusions: . sodium chloride 100 mL/hr at 09/18/20 0237  . ceFEPime (MAXIPIME) IV 2 g (09/18/20 0549)  . vancomycin Stopped (09/17/20 2200)     LOS: 4  days   Time spent: 43min  Kaleea Penner C Delora Gravatt, DO [facility]  If 7PM-7AM, please contact night-coverage [email or web address]  09/18/2020, 8:04 AM

## 2020-09-19 ENCOUNTER — Inpatient Hospital Stay (HOSPITAL_COMMUNITY): Payer: Medicare (Managed Care)

## 2020-09-19 DIAGNOSIS — A419 Sepsis, unspecified organism: Secondary | ICD-10-CM | POA: Diagnosis not present

## 2020-09-19 LAB — CULTURE, BLOOD (ROUTINE X 2)
Culture: NO GROWTH
Culture: NO GROWTH

## 2020-09-19 LAB — CBC WITH DIFFERENTIAL/PLATELET
Abs Immature Granulocytes: 0.05 10*3/uL (ref 0.00–0.07)
Basophils Absolute: 0 10*3/uL (ref 0.0–0.1)
Basophils Relative: 1 %
Eosinophils Absolute: 0.3 10*3/uL (ref 0.0–0.5)
Eosinophils Relative: 3 %
HCT: 24.7 % — ABNORMAL LOW (ref 39.0–52.0)
Hemoglobin: 7.6 g/dL — ABNORMAL LOW (ref 13.0–17.0)
Immature Granulocytes: 1 %
Lymphocytes Relative: 15 %
Lymphs Abs: 1.3 10*3/uL (ref 0.7–4.0)
MCH: 28.6 pg (ref 26.0–34.0)
MCHC: 30.8 g/dL (ref 30.0–36.0)
MCV: 92.9 fL (ref 80.0–100.0)
Monocytes Absolute: 1.8 10*3/uL — ABNORMAL HIGH (ref 0.1–1.0)
Monocytes Relative: 21 %
Neutro Abs: 5.2 10*3/uL (ref 1.7–7.7)
Neutrophils Relative %: 59 %
Platelets: 442 10*3/uL — ABNORMAL HIGH (ref 150–400)
RBC: 2.66 MIL/uL — ABNORMAL LOW (ref 4.22–5.81)
RDW: 21 % — ABNORMAL HIGH (ref 11.5–15.5)
WBC: 8.6 10*3/uL (ref 4.0–10.5)
nRBC: 0 % (ref 0.0–0.2)

## 2020-09-19 LAB — COMPREHENSIVE METABOLIC PANEL
ALT: 9 U/L (ref 0–44)
AST: 12 U/L — ABNORMAL LOW (ref 15–41)
Albumin: 2 g/dL — ABNORMAL LOW (ref 3.5–5.0)
Alkaline Phosphatase: 51 U/L (ref 38–126)
Anion gap: 10 (ref 5–15)
BUN: 8 mg/dL (ref 8–23)
CO2: 17 mmol/L — ABNORMAL LOW (ref 22–32)
Calcium: 8.6 mg/dL — ABNORMAL LOW (ref 8.9–10.3)
Chloride: 109 mmol/L (ref 98–111)
Creatinine, Ser: 0.91 mg/dL (ref 0.61–1.24)
GFR, Estimated: >60 mL/min (ref >60)
Glucose, Bld: 110 mg/dL — ABNORMAL HIGH (ref 70–99)
Potassium: 3.3 mmol/L — ABNORMAL LOW (ref 3.5–5.1)
Sodium: 136 mmol/L (ref 135–145)
Total Bilirubin: 0.8 mg/dL (ref 0.3–1.2)
Total Protein: 5.3 g/dL — ABNORMAL LOW (ref 6.5–8.1)

## 2020-09-19 LAB — GLUCOSE, CAPILLARY: Glucose-Capillary: 87 mg/dL (ref 70–99)

## 2020-09-19 LAB — C-REACTIVE PROTEIN: CRP: 5.1 mg/dL — ABNORMAL HIGH (ref <1.0)

## 2020-09-19 LAB — MAGNESIUM: Magnesium: 1.6 mg/dL — ABNORMAL LOW (ref 1.7–2.4)

## 2020-09-19 LAB — D-DIMER, QUANTITATIVE: D-Dimer, Quant: 0.46 ug/mL-FEU (ref 0.00–0.50)

## 2020-09-19 IMAGING — DX DG CHEST 1V PORT
1 series · 1 of 1 positions shown · non-contrast
Comparison: Chest radiograph dated [DATE].

CLINICAL DATA: 65-year-old male with cough.  Follow-up sepsis.

EXAM:
PORTABLE CHEST 1 VIEW

[chest ap]
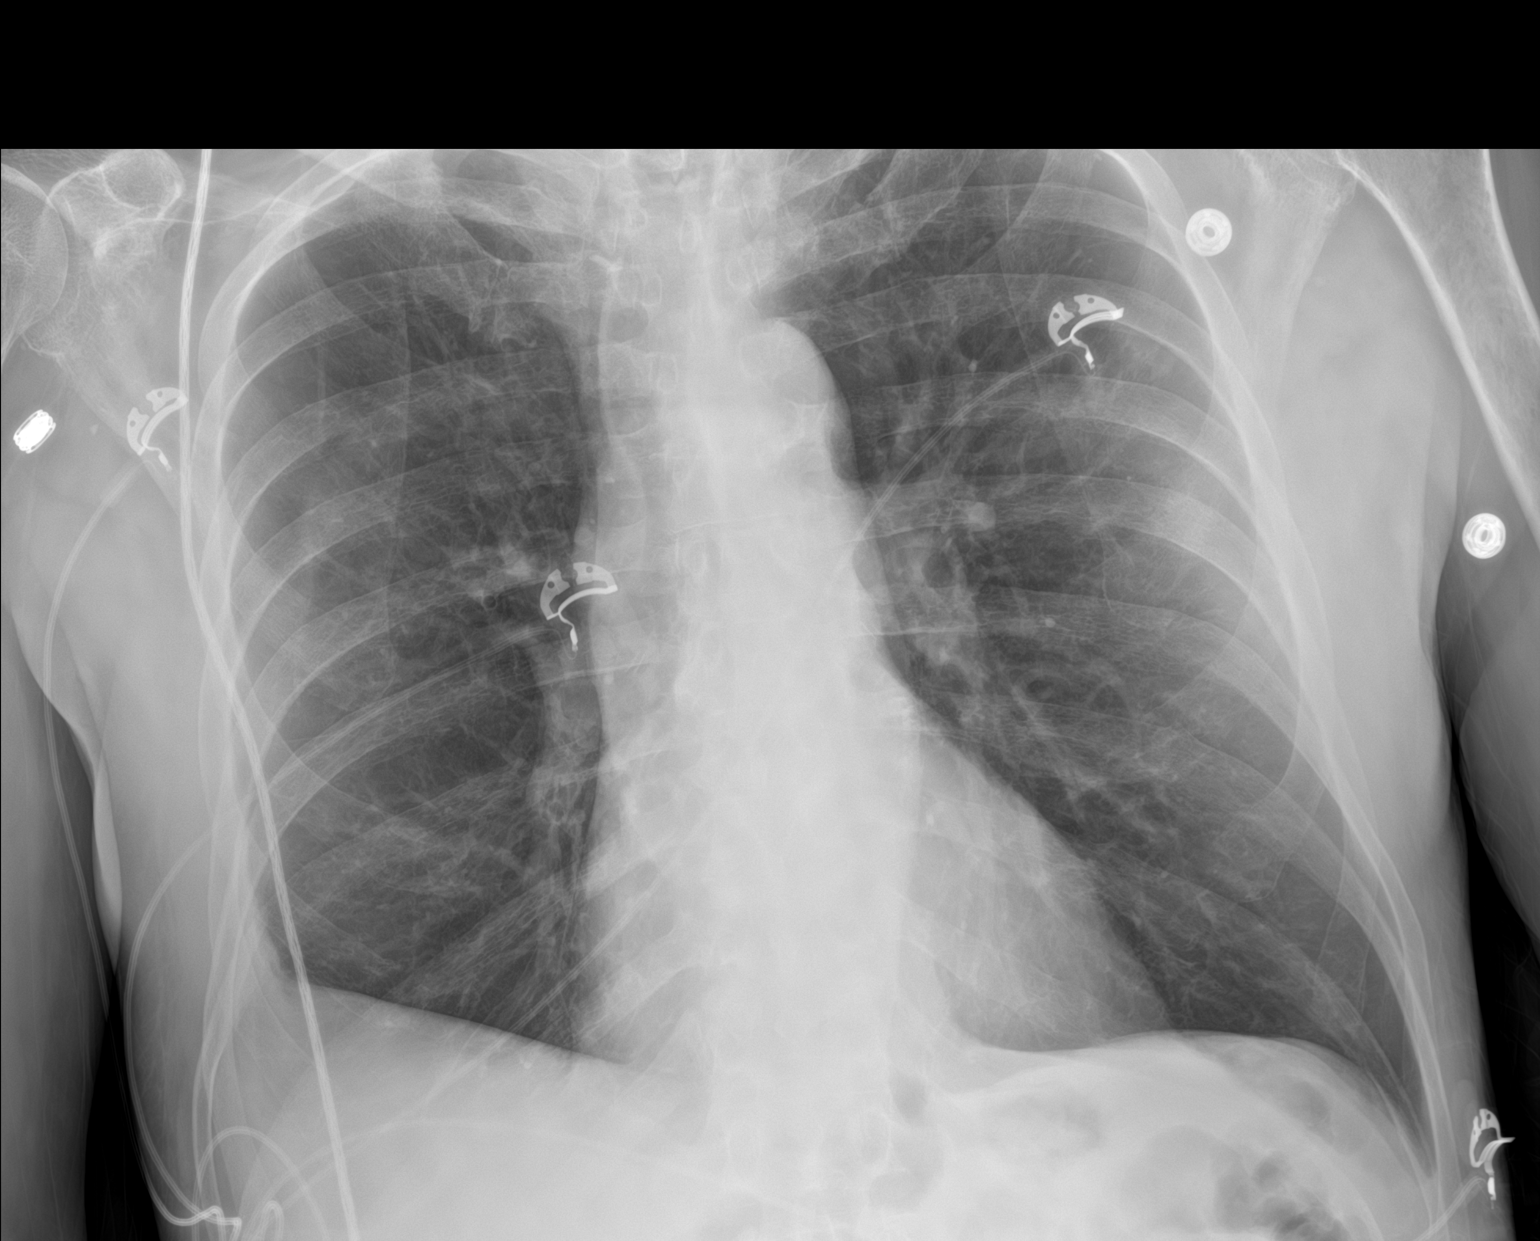

[1 of 1 positions shown; findings below may reference images not displayed]

FINDINGS: Background of emphysema. No focal consolidation, pleural effusion,
pneumothorax. Chronic right lung base pleural thickening/scarring.
The cardiac silhouette is within limits. No acute osseous pathology.
IMPRESSION: No active disease.

## 2020-09-19 MED ORDER — POTASSIUM CHLORIDE CRYS ER 20 MEQ PO TBCR
40.0000 meq | EXTENDED_RELEASE_TABLET | Freq: Every day | ORAL | Status: DC
  Administered 2020-09-19 – 2020-09-27 (×9): 40 meq via ORAL
  Filled 2020-09-19: qty 2
  Filled 2020-09-19: qty 4
  Filled 2020-09-19 (×7): qty 2

## 2020-09-19 MED ORDER — POTASSIUM CHLORIDE 10 MEQ/100ML IV SOLN
10.0000 meq | Freq: Once | INTRAVENOUS | Status: AC
  Administered 2020-09-19: 10 meq via INTRAVENOUS
  Filled 2020-09-19: qty 100

## 2020-09-19 MED ORDER — GUAIFENESIN-DM 100-10 MG/5ML PO SYRP
5.0000 mL | ORAL_SOLUTION | ORAL | Status: DC | PRN
  Administered 2020-09-20: 5 mL via ORAL
  Filled 2020-09-19: qty 5

## 2020-09-19 MED ORDER — MAGNESIUM SULFATE 2 GM/50ML IV SOLN
2.0000 g | Freq: Once | INTRAVENOUS | Status: AC
  Administered 2020-09-19: 2 g via INTRAVENOUS

## 2020-09-19 NOTE — Progress Notes (Signed)
PROGRESS NOTE    MATIS MONNIER  KZS:[id number] DOB: 05/29/1956 DOA: 09/14/2020 PCP: Lucianne Lei, MD   Brief Narrative:   [name] a 65 y.o.malewithhistory of alcohol abuse, hypertension and COPD was found to be confused while patient was shopping and was brought to the ER. At the time of my exam patient has become more alert awake. He states he has noticed that his left foot swelling with some ulceration on the distal phalanx for the last 1 month denies any trauma or insect bites. Denies taking any medications. Admits to drinking alcohol daily. In the ER patient was confusion initially CT head was unremarkable. Patient was febrile with temperature of 102 F x-rays of the left foot shows features concerning for osteomyelitis of the distal phalanx. Patient had blood cultures drawn and started on empiric antibiotics. Admitted for acute encephalopathy and possible developing sepsis from osteomyelitis cellulitis of the left foot. COVID test was positive but patient was not hypoxic. Labs show hemoglobin of 10. Lactic acid was 2 which improved to 1.9.  09/19/20: Increasing cough, but inflammatory markers and O2 need are ok. Check CXR. Add anti-tussives. Hgb trending down, but no evidence of bleed. Transfuse for Hgb <7. Replace K+. Check Mg2+.    Assessment & Plan:  Sepsis from left foot osteomyelitis and cellulitis.      - MRI: Focal area of ulceration overlying the first digit with evidence of osteomyelitis of the distal first phalanx. No loculated fluid collection..      - continue cefepime/flagyl/vanc     - ortho onboard, possible amputation     - ABI is abnormal, vascular unable to perform angioplasty 09/17/20 - will have to wait until next week per documentation     - bld cx remain negative  COVID-19 infection     - Unclear if acute or incidentally positive from older infection     - Not hypoxic and chest x-ray unremarkable.      - CRP elevated but likely secondary  to above sepsis/infection     - increasing cough today, but no fever; inflammatory markers and O2 need are fine; check CXR, add anti-tussives  Alcohol abuse      - on CIWA protocol.      - counseled against further EtOH use  Acute encephalopathy      - Secondary to sepsis; resolved with supportive care  COPD, not in acute exacerbation     - stable, follow  HTN     - not on meds at home; BP acceptable for now  Hypokalemia     - replace, check Mg2+  Normocytic anemia     - no evidence of bleed, follow; transfuse for hgb < 7.  DVT prophylaxis: lovenox Code Status: FULL Family Communication: None at bedisde   Status is: Inpatient  Remains inpatient appropriate because:Inpatient level of care appropriate due to severity of illness   Dispo: The patient is from: Home              Anticipated d/c is to: Home              Anticipated d/c date is: > 3 days              Patient currently is not medically stable to d/c.   Difficult to place patient No  Consultants:   Vascular surgery  Orthopedics  Antimicrobials:  . Cefepime . Flagyl . Vanc   Subjective: "I keep having this cough."  Objective: Vitals:  09/18/20 2028 09/19/20 0026 09/19/20 0447 09/19/20 0507  BP: 138/84 (!) 148/91 (!) 143/90   Pulse: (!) 109 (!) 108 (!) 104   Resp: 20 20 20    Temp: 98 F (36.7 C) 98.1 F (36.7 C) 98.6 F (37 C)   TempSrc: Oral Oral Oral   SpO2: 100% 100% 100%   Weight:    68.6 kg  Height:        Intake/Output Summary (Last 24 hours) at 09/19/2020 [id number] Last data filed at 09/19/2020 [id number] Gross per 24 hour  Intake 3088.66 ml  Output 1300 ml  Net 1788.66 ml   Filed Weights   09/17/20 0500 09/18/20 0400 09/19/20 0507  Weight: 67 kg 67.8 kg 68.6 kg    Examination:  General: 65 y.o. male resting in bed in NAD Eyes: PERRL, normal sclera ENMT: Nares patent w/o discharge, orophaynx clear, dentition normal, ears w/o discharge/lesions/ulcers Neck: Supple, trachea  midline Cardiovascular: RRR, +S1, S2, no m/g/r, equal pulses throughout Respiratory: CTABL, no w/r/r, normal WOB GI: BS+, NDNT, no masses noted, no organomegaly noted MSK: No e/c/c, L foot ulcer Neuro: A&O x 3, no focal deficits Psyc: Appropriate interaction and affect, calm/cooperative   Data Reviewed: I have personally reviewed following labs and imaging studies.  CBC: Recent Labs  Lab 09/15/20 0820 09/16/20 0552 09/17/20 0157 09/18/20 0521 09/19/20 0201  WBC 12.6* 12.2* 11.3* 8.0 8.6  NEUTROABS 9.2* 9.0* 7.6 4.8 5.2  HGB 8.5* 8.6* 8.8* 7.9* 7.6*  HCT 26.8* 26.9* 28.5* 24.6* 24.7*  MCV 95.7 94.4 94.4 91.4 92.9  PLT 407* 450* 466* 430* [id number]*   Basic Metabolic Panel: Recent Labs  Lab 09/15/20 0820 09/16/20 0552 09/17/20 0157 09/18/20 0521 09/19/20 0201  NA 135 137 136 138 136  K 4.0 4.3 3.9 3.4* 3.3*  CL 103 101 103 108 109  CO2 21* 24 19* 19* 17*  GLUCOSE 86 85 85 90 110*  BUN 9 8 8  7* 8  CREATININE 1.00 1.03 0.96 0.86 0.91  CALCIUM 8.4* 9.2 9.0 8.5* 8.6*   GFR: Estimated Creatinine Clearance: 80.6 mL/min (by C-G formula based on SCr of 0.91 mg/dL). Liver Function Tests: Recent Labs  Lab 09/15/20 0820 09/16/20 0552 09/17/20 0157 09/18/20 0521 09/19/20 0201  AST 17 15 15  10* 12*  ALT 10 10 10 8 9   ALKPHOS 60 62 62 52 51  BILITOT 0.9 0.9 0.9 0.5 0.8  PROT 5.2* 5.7* 6.1* 5.2* 5.3*  ALBUMIN 2.0* 2.1* 2.3* 2.0* 2.0*   No results for input(s): LIPASE, AMYLASE in the last 168 hours. Recent Labs  Lab 09/15/20 0820  AMMONIA 29   Coagulation Profile: Recent Labs  Lab 09/14/20 1508  INR 1.1   Cardiac Enzymes: No results for input(s): CKTOTAL, CKMB, CKMBINDEX, TROPONINI in the last 168 hours. BNP (last 3 results) No results for input(s): PROBNP in the last 8760 hours. HbA1C: No results for input(s): HGBA1C in the last 72 hours. CBG: No results for input(s): GLUCAP in the last 168 hours. Lipid Profile: No results for input(s): CHOL, HDL, LDLCALC,  TRIG, CHOLHDL, LDLDIRECT in the last 72 hours. Thyroid Function Tests: No results for input(s): TSH, T4TOTAL, FREET4, T3FREE, THYROIDAB in the last 72 hours. Anemia Panel: No results for input(s): VITAMINB12, FOLATE, FERRITIN, TIBC, IRON, RETICCTPCT in the last 72 hours. Sepsis Labs: Recent Labs  Lab 09/14/20 1530 09/14/20 1708 09/15/20 0820  PROCALCITON  --   --  0.14  LATICACIDVEN 2.0* 1.9 1.3    Recent Results (from the past 240 hour(s))  Culture, blood (Routine x 2)     Status: None (Preliminary result)   Collection Time: 09/14/20  3:08 PM   Specimen: BLOOD  Result Value Ref Range Status   Specimen Description BLOOD SITE NOT SPECIFIED  Final   Special Requests   Final    BOTTLES DRAWN AEROBIC AND ANAEROBIC Blood Culture results may not be optimal due to an inadequate volume of blood received in culture bottles   Culture   Final    NO GROWTH 4 DAYS Performed at [facility], [place] [address], [place], [place] [address]    Report Status PENDING  Incomplete  Resp Panel by RT-PCR (Flu A&B, Covid) Nasopharyngeal Swab     Status: Abnormal   Collection Time: 09/14/20  3:12 PM   Specimen: Nasopharyngeal Swab; Nasopharyngeal(NP) swabs in vial transport medium  Result Value Ref Range Status   SARS Coronavirus 2 by RT PCR POSITIVE (A) NEGATIVE Final    Comment: RESULT CALLED TO, READ BACK BY AND VERIFIED WITH: K PATE RN 1630 09/14/20 A BROWNING (NOTE) SARS-CoV-2 target nucleic acids are DETECTED.  The SARS-CoV-2 RNA is generally detectable in upper respiratory specimens during the acute phase of infection. Positive results are indicative of the presence of the identified virus, but do not rule out bacterial infection or co-infection with other pathogens not detected by the test. Clinical correlation with patient history and other diagnostic information is necessary to determine patient infection status. The expected result is Negative.  Fact Sheet for  Patients: EntrepreneurPulse.com.au  Fact Sheet for Healthcare Providers: IncredibleEmployment.be  This test is not yet approved or cleared by the [place] FDA and  has been authorized for detection and/or diagnosis of SARS-CoV-2 by FDA under an Emergency Use Authorization (EUA).  This EUA will remain in effect (meaning this test can be  used) for the duration of  the COVID-19 declaration under Section 564(b)(1) of the Act, 21 U.S.C. section 360bbb-3(b)(1), unless the authorization is terminated or revoked sooner.     Influenza A by PCR NEGATIVE NEGATIVE Final   Influenza B by PCR NEGATIVE NEGATIVE Final    Comment: (NOTE) The Xpert Xpress SARS-CoV-2/FLU/RSV plus assay is intended as an aid in the diagnosis of influenza from Nasopharyngeal swab specimens and should not be used as a sole basis for treatment. Nasal washings and aspirates are unacceptable for Xpert Xpress SARS-CoV-2/FLU/RSV testing.  Fact Sheet for Patients: EntrepreneurPulse.com.au  Fact Sheet for Healthcare Providers: IncredibleEmployment.be  This test is not yet approved or cleared by the [place] FDA and has been authorized for detection and/or diagnosis of SARS-CoV-2 by FDA under an Emergency Use Authorization (EUA). This EUA will remain in effect (meaning this test can be used) for the duration of the COVID-19 declaration under Section 564(b)(1) of the Act, 21 U.S.C. section 360bbb-3(b)(1), unless the authorization is terminated or revoked.  Performed at [facility], [place] [address] [place], [place] [address]   Culture, blood (Routine x 2)     Status: None (Preliminary result)   Collection Time: 09/14/20  7:31 PM   Specimen: BLOOD LEFT HAND  Result Value Ref Range Status   Specimen Description BLOOD LEFT HAND  Final   Special Requests   Final    BOTTLES DRAWN AEROBIC ONLY Blood Culture results may not be optimal due  to an inadequate volume of blood received in culture bottles   Culture   Final    NO GROWTH 4 DAYS Performed at [facility], [address] [place], [place] [address]    Report Status PENDING  Incomplete  Urine culture     Status: None   Collection Time: 09/14/20 10:38 PM   Specimen: In/Out Cath Urine  Result Value Ref Range Status   Specimen Description IN/OUT CATH URINE  Final   Special Requests NONE  Final   Culture   Final    NO GROWTH Performed at [facility], [place] [address] [place], [place] [address]    Report Status 09/16/2020 FINAL  Final      Radiology Studies: No results found.   Scheduled Meds: . enoxaparin (LOVENOX) injection  40 mg Subcutaneous Q24H  . folic acid  1 mg Oral Daily  . metroNIDAZOLE  500 mg Oral Q8H  . multivitamin with minerals  1 tablet Oral Daily  . thiamine  100 mg Oral Daily   Or  . thiamine  100 mg Intravenous Daily   Continuous Infusions: . sodium chloride 100 mL/hr at 09/19/20 0420  . ceFEPime (MAXIPIME) IV 2 g (09/19/20 0534)  . vancomycin Stopped (09/19/20 0221)     LOS: 5 days    Time spent: 25 minutes spent in the coordination of care today.    Jonnie Finner, DO [facility]  If 7PM-7AM, please contact night-coverage [email or web address] 09/19/2020, 7:13 AM

## 2020-09-20 ENCOUNTER — Encounter (HOSPITAL_COMMUNITY)
Admission: EM | Disposition: A | Discharge status: Home Health | Payer: Self-pay | Source: Home / Self Care | Attending: Internal Medicine

## 2020-09-20 DIAGNOSIS — A419 Sepsis, unspecified organism: Secondary | ICD-10-CM | POA: Diagnosis not present

## 2020-09-20 DIAGNOSIS — I70262 Atherosclerosis of native arteries of extremities with gangrene, left leg: Secondary | ICD-10-CM

## 2020-09-20 HISTORY — PX: PERIPHERAL VASCULAR ATHERECTOMY: CATH118256

## 2020-09-20 HISTORY — PX: PERIPHERAL VASCULAR INTERVENTION: CATH118257

## 2020-09-20 HISTORY — PX: ABDOMINAL AORTOGRAM W/LOWER EXTREMITY: CATH118223

## 2020-09-20 LAB — CBC WITH DIFFERENTIAL/PLATELET
Abs Immature Granulocytes: 0.07 10*3/uL (ref 0.00–0.07)
Basophils Absolute: 0 10*3/uL (ref 0.0–0.1)
Basophils Relative: 1 %
Eosinophils Absolute: 0.2 10*3/uL (ref 0.0–0.5)
Eosinophils Relative: 2 %
HCT: 23.7 % — ABNORMAL LOW (ref 39.0–52.0)
Hemoglobin: 7.7 g/dL — ABNORMAL LOW (ref 13.0–17.0)
Immature Granulocytes: 1 %
Lymphocytes Relative: 17 %
Lymphs Abs: 1.5 10*3/uL (ref 0.7–4.0)
MCH: 30.1 pg (ref 26.0–34.0)
MCHC: 32.5 g/dL (ref 30.0–36.0)
MCV: 92.6 fL (ref 80.0–100.0)
Monocytes Absolute: 1.9 10*3/uL — ABNORMAL HIGH (ref 0.1–1.0)
Monocytes Relative: 21 %
Neutro Abs: 5.2 10*3/uL (ref 1.7–7.7)
Neutrophils Relative %: 58 %
Platelets: 490 10*3/uL — ABNORMAL HIGH (ref 150–400)
RBC: 2.56 MIL/uL — ABNORMAL LOW (ref 4.22–5.81)
RDW: 21.4 % — ABNORMAL HIGH (ref 11.5–15.5)
WBC: 8.8 10*3/uL (ref 4.0–10.5)
nRBC: 0 % (ref 0.0–0.2)

## 2020-09-20 LAB — COMPREHENSIVE METABOLIC PANEL
ALT: 8 U/L (ref 0–44)
AST: 12 U/L — ABNORMAL LOW (ref 15–41)
Albumin: 2 g/dL — ABNORMAL LOW (ref 3.5–5.0)
Alkaline Phosphatase: 50 U/L (ref 38–126)
Anion gap: 8 (ref 5–15)
BUN: <5 mg/dL — ABNORMAL LOW (ref 8–23)
CO2: 18 mmol/L — ABNORMAL LOW (ref 22–32)
Calcium: 8.9 mg/dL (ref 8.9–10.3)
Chloride: 111 mmol/L (ref 98–111)
Creatinine, Ser: 0.89 mg/dL (ref 0.61–1.24)
GFR, Estimated: >60 mL/min (ref >60)
Glucose, Bld: 87 mg/dL (ref 70–99)
Potassium: 3.8 mmol/L (ref 3.5–5.1)
Sodium: 137 mmol/L (ref 135–145)
Total Bilirubin: 0.7 mg/dL (ref 0.3–1.2)
Total Protein: 5.5 g/dL — ABNORMAL LOW (ref 6.5–8.1)

## 2020-09-20 LAB — D-DIMER, QUANTITATIVE: D-Dimer, Quant: 0.43 ug/mL-FEU (ref 0.00–0.50)

## 2020-09-20 LAB — C-REACTIVE PROTEIN: CRP: 5.1 mg/dL — ABNORMAL HIGH (ref <1.0)

## 2020-09-20 SURGERY — ABDOMINAL AORTOGRAM W/LOWER EXTREMITY
Anesthesia: LOCAL

## 2020-09-20 MED ORDER — ONDANSETRON HCL 4 MG/2ML IJ SOLN: 4.0000 mg | Freq: Four times a day (QID) | INTRAMUSCULAR | Status: DC | PRN

## 2020-09-20 MED ORDER — CLOPIDOGREL BISULFATE 75 MG PO TABS: 300.0000 mg | ORAL_TABLET | Freq: Once | ORAL | Status: DC

## 2020-09-20 MED ORDER — LABETALOL HCL 5 MG/ML IV SOLN
INTRAVENOUS | Status: DC | PRN
  Administered 2020-09-20: 10 mg via INTRAVENOUS

## 2020-09-20 MED ORDER — LABETALOL HCL 5 MG/ML IV SOLN
INTRAVENOUS | Status: AC
  Filled 2020-09-20: qty 4

## 2020-09-20 MED ORDER — MIDAZOLAM HCL 2 MG/2ML IJ SOLN
INTRAMUSCULAR | Status: AC
  Filled 2020-09-20: qty 2

## 2020-09-20 MED ORDER — CLOPIDOGREL BISULFATE 300 MG PO TABS
ORAL_TABLET | ORAL | Status: AC
  Filled 2020-09-20: qty 1

## 2020-09-20 MED ORDER — LIDOCAINE HCL (PF) 1 % IJ SOLN
INTRAMUSCULAR | Status: AC
  Filled 2020-09-20: qty 30

## 2020-09-20 MED ORDER — IODIXANOL 320 MG/ML IV SOLN
INTRAVENOUS | Status: DC | PRN
  Administered 2020-09-20: 130 mL via INTRA_ARTERIAL

## 2020-09-20 MED ORDER — ROSUVASTATIN CALCIUM 5 MG PO TABS
10.0000 mg | ORAL_TABLET | Freq: Every day | ORAL | Status: DC
  Administered 2020-09-20 – 2020-09-27 (×8): 10 mg via ORAL
  Filled 2020-09-20 (×8): qty 2

## 2020-09-20 MED ORDER — HEPARIN (PORCINE) IN NACL 1000-0.9 UT/500ML-% IV SOLN
INTRAVENOUS | Status: AC
  Filled 2020-09-20: qty 1000

## 2020-09-20 MED ORDER — HEPARIN SODIUM (PORCINE) 1000 UNIT/ML IJ SOLN
INTRAMUSCULAR | Status: DC | PRN
  Administered 2020-09-20: 10000 [IU] via INTRAVENOUS

## 2020-09-20 MED ORDER — SODIUM CHLORIDE 0.9% FLUSH: 3.0000 mL | INTRAVENOUS | Status: DC | PRN

## 2020-09-20 MED ORDER — FENTANYL CITRATE (PF) 100 MCG/2ML IJ SOLN
INTRAMUSCULAR | Status: AC
  Filled 2020-09-20: qty 2

## 2020-09-20 MED ORDER — CLOPIDOGREL BISULFATE 300 MG PO TABS
ORAL_TABLET | ORAL | Status: DC | PRN
  Administered 2020-09-20: 300 mg via ORAL

## 2020-09-20 MED ORDER — SODIUM CHLORIDE 0.9 % IV SOLN: 250.0000 mL | INTRAVENOUS | Status: DC | PRN

## 2020-09-20 MED ORDER — ACETAMINOPHEN 325 MG PO TABS: 650.0000 mg | ORAL_TABLET | ORAL | Status: DC | PRN

## 2020-09-20 MED ORDER — HEPARIN SODIUM (PORCINE) 1000 UNIT/ML IJ SOLN
INTRAMUSCULAR | Status: AC
  Filled 2020-09-20: qty 1

## 2020-09-20 MED ORDER — SODIUM CHLORIDE 0.9% FLUSH
3.0000 mL | Freq: Two times a day (BID) | INTRAVENOUS | Status: DC
  Administered 2020-09-20 – 2020-09-27 (×9): 3 mL via INTRAVENOUS

## 2020-09-20 MED ORDER — MIDAZOLAM HCL 2 MG/2ML IJ SOLN
INTRAMUSCULAR | Status: DC | PRN
  Administered 2020-09-20: 1 mg via INTRAVENOUS

## 2020-09-20 MED ORDER — LIDOCAINE HCL (PF) 1 % IJ SOLN
INTRAMUSCULAR | Status: DC | PRN
  Administered 2020-09-20: 15 mL via INTRADERMAL

## 2020-09-20 MED ORDER — SODIUM CHLORIDE 0.9 % IV SOLN: INTRAVENOUS | Status: AC

## 2020-09-20 MED ORDER — LABETALOL HCL 5 MG/ML IV SOLN: 10.0000 mg | INTRAVENOUS | Status: DC | PRN

## 2020-09-20 MED ORDER — HYDRALAZINE HCL 20 MG/ML IJ SOLN
5.0000 mg | INTRAMUSCULAR | Status: DC | PRN
  Filled 2020-09-20: qty 1

## 2020-09-20 MED ORDER — MAGNESIUM OXIDE 400 (241.3 MG) MG PO TABS: 400.0000 mg | ORAL_TABLET | Freq: Two times a day (BID) | ORAL | Status: DC

## 2020-09-20 MED ORDER — CLOPIDOGREL BISULFATE 75 MG PO TABS
75.0000 mg | ORAL_TABLET | Freq: Every day | ORAL | Status: DC
  Administered 2020-09-21 – 2020-09-27 (×7): 75 mg via ORAL
  Filled 2020-09-20 (×7): qty 1

## 2020-09-20 MED ORDER — FENTANYL CITRATE (PF) 100 MCG/2ML IJ SOLN
INTRAMUSCULAR | Status: DC | PRN
  Administered 2020-09-20: 25 ug via INTRAVENOUS

## 2020-09-20 MED ORDER — HEPARIN (PORCINE) IN NACL 1000-0.9 UT/500ML-% IV SOLN
INTRAVENOUS | Status: DC | PRN
  Administered 2020-09-20 (×2): 500 mL

## 2020-09-20 SURGICAL SUPPLY — 23 items
BALLN COYOTE OTW 4X150X150 (BALLOONS) ×3
BALLN MUSTANG 5.0X40 135 (BALLOONS) ×3
BALLOON COYOTE OTW 4X150X150 (BALLOONS) IMPLANT
BALLOON MUSTANG 5.0X40 135 (BALLOONS) IMPLANT
CATH AURYON 5FR ATHEREC 1.5 (CATHETERS) ×1 IMPLANT
CATH OMNI FLUSH 5F 65CM (CATHETERS) ×1 IMPLANT
CATH QUICKCROSS .035X135CM (MICROCATHETER) ×1 IMPLANT
CLOSURE MYNX CONTROL 6F/7F (Vascular Products) ×1 IMPLANT
DEVICE TORQUE H2O (MISCELLANEOUS) ×1 IMPLANT
GLIDEWIRE ADV .035X260CM (WIRE) ×1 IMPLANT
KIT ENCORE 26 ADVANTAGE (KITS) ×1 IMPLANT
KIT MICROPUNCTURE NIT STIFF (SHEATH) ×1 IMPLANT
KIT PV (KITS) ×3 IMPLANT
SHEATH PINNACLE 5F 10CM (SHEATH) ×1 IMPLANT
SHEATH PINNACLE 6F 10CM (SHEATH) ×1 IMPLANT
SHEATH PINNACLE ST 6F 65CM (SHEATH) ×1 IMPLANT
SHEATH PROBE COVER 6X72 (BAG) ×1 IMPLANT
STENT ELUVIA 6X40X130 (Permanent Stent) ×1 IMPLANT
SYR MEDRAD MARK V 150ML (SYRINGE) ×1 IMPLANT
TRANSDUCER W/STOPCOCK (MISCELLANEOUS) ×3 IMPLANT
TRAY PV CATH (CUSTOM PROCEDURE TRAY) ×3 IMPLANT
WIRE BENTSON .035X145CM (WIRE) ×1 IMPLANT
WIRE SPARTACORE .014X300CM (WIRE) ×1 IMPLANT

## 2020-09-20 NOTE — Progress Notes (Signed)
Transfer to cath lab then to 2c room 9.

## 2020-09-20 NOTE — Op Note (Signed)
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 Donzetta Matters, MD Procedure Performed: 1.  Ultrasound-guided cannulation right common femoral artery 2.  Aortogram with bilateral lower extremity runoff 3.  Laser arthrectomy left above and below-knee popliteal arteries with 1.5 mm [place] 4.  Plain balloon angioplasty below-knee popliteal artery with 4 mm balloon 5.  Stent of above-knee popliteal artery with a Elluvia drug-eluting stent 6 x 40 mm 6.  Moderate sedation with fentanyl and Versed for 64 minutes 7.  Mynx device closure right common femoral artery  Indications: 65 year old male admitted with Covid has gangrenous changes to the left toes.  He has severely decreased ABIs with monophasic waveforms indicated for aortogram with bilateral lower extremity angiography and possible intervention of the left.  Findings: The aorta and iliac segments are free of flow-limiting stenosis.  Renal arteries bilaterally are patent without flow-limiting stenosis.  The right side the SFA and popliteal arteries are patent.  He appears to have two-vessel runoff which is diseased however without flow limitation via the anterior tibial and posterior tibial artery.  The peroneal artery appears occluded at its origin.  On the left side the SFA is patent up to the abductor and there is a heavily calcified area measuring 3 cm.  Below the knee there is a 90% stenosis measuring 1 cm.  He has single-vessel runoff via the anterior tibial artery which has multiple less than 20% stenoses throughout its course does fill the foot.  After intervention with stenting there is less than 20% stenosis of the above-knee popliteal and after balloon angioplasty below the knee there is 0% residual stenosis without dissection.  Patient will need amputation of  multiple toes on the left versus transmetatarsal amputation.   Procedure:  The patient was identified in the holding area and taken to room 8.  The patient was then placed supine on the table and prepped and draped in the usual sterile fashion.  A time out was called.  Ultrasound was used to evaluate the right common femoral artery.  This artery was large was compressible and appeared to be free of flow-limiting stenosis or any disease.  The area was anesthetized 1% lidocaine and cannulated with micropuncture needle followed by wire and sheath.  An image was saved the permanent record.  Moderate sedation was administered with fentanyl and Versed his vital signs and heart rhythm were monitored throughout the case.  We placed a Bentson wire followed by 5 [place] sheath and an Omni catheter to the level of L1 and performed aortogram followed by lower extremity runoff.  We then crossed the bifurcation with Omni catheter Glidewire advantage placed a long 6 French sheath into the SFA patient was fully heparinized.  We then crossed the lesions above the knee and below the knee popliteal with Glidewire advantage and quick cross catheter.  We confirmed intraluminal access we were also able to get better imaging of his foot and distal anterior tibial artery.  We then performed laser arthrectomy of the above and below-knee popliteal arteries followed by 4 mm balloon angioplasty.  We then primarily stented the above-knee popliteal with a 6 x 40 mm drug-eluting stent and postdilated with a 5 mm balloon.  Completion demonstrated that the area was heavily calcified however the stent was wide open in 2 views.  The lesion below the knee was widely patent  with no residual stenosis and no dissection.  We good flow into the foot via the anterior tibial artery which also filled the peroneal at the ankle.  Satisfied with this we exchanged for a short 6 French sheath deployed a minx device which deployed well.  He tolerated procedure  without any complication.  Contrast: 130 cc   Autumne Kallio C. Donzetta Matters, MD [facility] Office: [phone] Pager: [phone]

## 2020-09-20 NOTE — Progress Notes (Signed)
Pt arrived from unit from cath ;lab right groin level 0. VSS.Will continue to monitor. Pt. Oriented to unit   Phoebe Sharps, RN

## 2020-09-20 NOTE — Progress Notes (Signed)
PROGRESS NOTE    OLNEY LAFAZIA  [id number] DOB: June 13, 1956 DOA: 09/14/2020 PCP: Lucianne Lei, MD   Brief Narrative:   [name] a 65 y.o.malewithhistory of alcohol abuse, hypertension and COPD was found to be confused while patient was shopping and was brought to the ER. At the time of my exam patient has become more alert awake. He states he has noticed that his left foot swelling with some ulceration on the distal phalanx for the last 1 month denies any trauma or insect bites. Denies taking any medications. Admits to drinking alcohol daily. In the ER patient was confusion initially CT head was unremarkable. Patient was febrile with temperature of 102 F x-rays of the left foot shows features concerning for osteomyelitis of the distal phalanx. Patient had blood cultures drawn and started on empiric antibiotics. Admitted for acute encephalopathy and possible developing sepsis from osteomyelitis cellulitis of the left foot. COVID test was positive but patient was not hypoxic. Labs show hemoglobin of 10. Lactic acid was 2 which improved to 1.9.  1/24: CXR unremarkable. Hgb is stable this AM. Mg2+ low, but replaced last night. Follow AM labs. Appreciate all consultants assistance. Awaiting final recs.   Assessment & Plan: Sepsis from left foot osteomyelitis and cellulitis.      - MRI: Focal area of ulceration overlying the first digit with evidence of osteomyelitis of the distal first phalanx. No loculated fluid collection..      - continue cefepime/flagyl/vanc     - ortho onboard, possible amputation     - ABI is abnormal, vascular unable to perform arteriogram 09/17/20 - will have to wait until next week per documentation     - 1/24: Bld Cx negative, continue current abx; awaiting arteriogram  COVID-19 infection     - Unclear if acute or incidentally positive from older infection     - Not hypoxic and chest x-ray unremarkable.      - CRP elevated but likely  secondary to above sepsis/infection     - increasing cough 1/23, but no fever; inflammatory markers and O2 need are fine; check CXR, add anti-tussives     - 1/24: he says he cough is better today, CXR is clear, continue current regimen  Alcohol abuse      - on CIWA protocol.      - counseled against further EtOH use     - 1/24: No signs of withdrawal  Acute encephalopathy      - Secondary to sepsis; resolved with supportive care  COPD, not in acute exacerbation     - stable, follow  HTN     - not on meds at home; BP acceptable for now  Hypokalemia     - replace, check Mg2+  Normocytic anemia     - no evidence of bleed, follow; transfuse for hgb < 7.     - 1/24: Hgb stable this AM, follow  DVT prophylaxis: lovenox Code Status: FULL Family Communication: None at bedside   Status is: Inpatient  Remains inpatient appropriate because:Inpatient level of care appropriate due to severity of illness   Dispo: The patient is from: Home              Anticipated d/c is to: Home              Anticipated d/c date is: > 3 days              Patient currently is not medically stable to d/c.  Difficult to place patient No  Consultants:   Vascular surgery  Orthopedics  Antimicrobials:  . Cefepime . Flagyl . Vanc   Subjective: "I'm doing fine."  Objective: Vitals:   09/20/20 0015 09/20/20 0435 09/20/20 0900 09/20/20 1207  BP: (!) 144/83 138/85  118/74  Pulse: 91 85  100  Resp: 20 20  18   Temp: 98.4 F (36.9 C) 98.2 F (36.8 C)  98.2 F (36.8 C)  TempSrc: Oral Oral  Oral  SpO2: 100% 100% 100% 98%  Weight:  68.4 kg    Height:        Intake/Output Summary (Last 24 hours) at 09/20/2020 1248 Last data filed at 09/20/2020 1208 Gross per 24 hour  Intake 1995.5 ml  Output 3375 ml  Net -1379.5 ml   Filed Weights   09/18/20 0400 09/19/20 0507 09/20/20 0435  Weight: 67.8 kg 68.6 kg 68.4 kg    Examination:  General: 65 y.o. male resting in bed in NAD Eyes:  PERRL, normal sclera ENMT: Nares patent w/o discharge, orophaynx clear, dentition normal, ears w/o discharge/lesions/ulcers Neck: Supple, trachea midline Cardiovascular: RRR, +S1, S2, no m/g/r Respiratory: CTABL, no w/r/r, normal WOB GI: BS+, NDNT, no masses noted, no organomegaly noted MSK: No e/c/c, L foot ulcer Skin: No rashes, bruises, ulcerations noted Neuro: A&O x 3, no focal deficits Psyc: Appropriate interaction and affect, calm/cooperative   Data Reviewed: I have personally reviewed following labs and imaging studies.  CBC: Recent Labs  Lab 09/16/20 0552 09/17/20 0157 09/18/20 0521 09/19/20 0201 09/20/20 0201  WBC 12.2* 11.3* 8.0 8.6 8.8  NEUTROABS 9.0* 7.6 4.8 5.2 5.2  HGB 8.6* 8.8* 7.9* 7.6* 7.7*  HCT 26.9* 28.5* 24.6* 24.7* 23.7*  MCV 94.4 94.4 91.4 92.9 92.6  PLT 450* 466* 430* 442* [id number]*   Basic Metabolic Panel: Recent Labs  Lab 09/16/20 0552 09/17/20 0157 09/18/20 0521 09/19/20 0201 09/19/20 1725 09/20/20 0201  NA 137 136 138 136  --  137  K 4.3 3.9 3.4* 3.3*  --  3.8  CL 101 103 108 109  --  111  CO2 24 19* 19* 17*  --  18*  GLUCOSE 85 85 90 110*  --  87  BUN 8 8 7* 8  --  <5*  CREATININE 1.03 0.96 0.86 0.91  --  0.89  CALCIUM 9.2 9.0 8.5* 8.6*  --  8.9  MG  --   --   --   --  1.6*  --    GFR: Estimated Creatinine Clearance: 82.2 mL/min (by C-G formula based on SCr of 0.89 mg/dL). Liver Function Tests: Recent Labs  Lab 09/16/20 0552 09/17/20 0157 09/18/20 0521 09/19/20 0201 09/20/20 0201  AST 15 15 10* 12* 12*  ALT 10 10 8 9 8   ALKPHOS 62 62 52 51 50  BILITOT 0.9 0.9 0.5 0.8 0.7  PROT 5.7* 6.1* 5.2* 5.3* 5.5*  ALBUMIN 2.1* 2.3* 2.0* 2.0* 2.0*   No results for input(s): LIPASE, AMYLASE in the last 168 hours. Recent Labs  Lab 09/15/20 0820  AMMONIA 29   Coagulation Profile: Recent Labs  Lab 09/14/20 1508  INR 1.1   Cardiac Enzymes: No results for input(s): CKTOTAL, CKMB, CKMBINDEX, TROPONINI in the last 168 hours. BNP (last 3  results) No results for input(s): PROBNP in the last 8760 hours. HbA1C: No results for input(s): HGBA1C in the last 72 hours. CBG: Recent Labs  Lab 09/19/20 0814  GLUCAP 87   Lipid Profile: No results for input(s): CHOL, HDL, LDLCALC, TRIG,  CHOLHDL, LDLDIRECT in the last 72 hours. Thyroid Function Tests: No results for input(s): TSH, T4TOTAL, FREET4, T3FREE, THYROIDAB in the last 72 hours. Anemia Panel: No results for input(s): VITAMINB12, FOLATE, FERRITIN, TIBC, IRON, RETICCTPCT in the last 72 hours. Sepsis Labs: Recent Labs  Lab 09/14/20 1530 09/14/20 1708 09/15/20 0820  PROCALCITON  --   --  0.14  LATICACIDVEN 2.0* 1.9 1.3    Recent Results (from the past 240 hour(s))  Culture, blood (Routine x 2)     Status: None   Collection Time: 09/14/20  3:08 PM   Specimen: BLOOD  Result Value Ref Range Status   Specimen Description BLOOD SITE NOT SPECIFIED  Final   Special Requests   Final    BOTTLES DRAWN AEROBIC AND ANAEROBIC Blood Culture results may not be optimal due to an inadequate volume of blood received in culture bottles   Culture   Final    NO GROWTH 5 DAYS Performed at [facility], [place] [address], [place], [place] [address]    Report Status 09/19/2020 FINAL  Final  Resp Panel by RT-PCR (Flu A&B, Covid) Nasopharyngeal Swab     Status: Abnormal   Collection Time: 09/14/20  3:12 PM   Specimen: Nasopharyngeal Swab; Nasopharyngeal(NP) swabs in vial transport medium  Result Value Ref Range Status   SARS Coronavirus 2 by RT PCR POSITIVE (A) NEGATIVE Final    Comment: RESULT CALLED TO, READ BACK BY AND VERIFIED WITH: K PATE RN 1630 09/14/20 A BROWNING (NOTE) SARS-CoV-2 target nucleic acids are DETECTED.  The SARS-CoV-2 RNA is generally detectable in upper respiratory specimens during the acute phase of infection. Positive results are indicative of the presence of the identified virus, but do not rule out bacterial infection or co-infection with other pathogens  not detected by the test. Clinical correlation with patient history and other diagnostic information is necessary to determine patient infection status. The expected result is Negative.  Fact Sheet for Patients: EntrepreneurPulse.com.au  Fact Sheet for Healthcare Providers: IncredibleEmployment.be  This test is not yet approved or cleared by the [place] FDA and  has been authorized for detection and/or diagnosis of SARS-CoV-2 by FDA under an Emergency Use Authorization (EUA).  This EUA will remain in effect (meaning this test can be  used) for the duration of  the COVID-19 declaration under Section 564(b)(1) of the Act, 21 U.S.C. section 360bbb-3(b)(1), unless the authorization is terminated or revoked sooner.     Influenza A by PCR NEGATIVE NEGATIVE Final   Influenza B by PCR NEGATIVE NEGATIVE Final    Comment: (NOTE) The Xpert Xpress SARS-CoV-2/FLU/RSV plus assay is intended as an aid in the diagnosis of influenza from Nasopharyngeal swab specimens and should not be used as a sole basis for treatment. Nasal washings and aspirates are unacceptable for Xpert Xpress SARS-CoV-2/FLU/RSV testing.  Fact Sheet for Patients: EntrepreneurPulse.com.au  Fact Sheet for Healthcare Providers: IncredibleEmployment.be  This test is not yet approved or cleared by the [place] FDA and has been authorized for detection and/or diagnosis of SARS-CoV-2 by FDA under an Emergency Use Authorization (EUA). This EUA will remain in effect (meaning this test can be used) for the duration of the COVID-19 declaration under Section 564(b)(1) of the Act, 21 U.S.C. section 360bbb-3(b)(1), unless the authorization is terminated or revoked.  Performed at [facility], [place] [address], [place], [place] [address]   Culture, blood (Routine x 2)     Status: None   Collection Time: 09/14/20  7:31  PM   Specimen: BLOOD LEFT  HAND  Result Value Ref Range Status   Specimen Description BLOOD LEFT HAND  Final   Special Requests   Final    BOTTLES DRAWN AEROBIC ONLY Blood Culture results may not be optimal due to an inadequate volume of blood received in culture bottles   Culture   Final    NO GROWTH 5 DAYS Performed at [facility], [place] [address] [place], [place] [address]    Report Status 09/19/2020 FINAL  Final  Urine culture     Status: None   Collection Time: 09/14/20 10:38 PM   Specimen: In/Out Cath Urine  Result Value Ref Range Status   Specimen Description IN/OUT CATH URINE  Final   Special Requests NONE  Final   Culture   Final    NO GROWTH Performed at [facility], [place] [address], [place], [place] [address]    Report Status 09/16/2020 FINAL  Final      Radiology Studies: DG CHEST PORT 1 VIEW  Result Date: 09/19/2020 CLINICAL DATA:  65 year old male with cough.  Follow-up sepsis. EXAM: PORTABLE CHEST 1 VIEW COMPARISON:  Chest radiograph dated 09/14/2020. FINDINGS: Background of emphysema. No focal consolidation, pleural effusion, pneumothorax. Chronic right lung base pleural thickening/scarring. The cardiac silhouette is within limits. No acute osseous pathology. IMPRESSION: No active disease. Electronically Signed   By: Anner Crete M.D.   On: 09/19/2020 18:50     Scheduled Meds: . enoxaparin (LOVENOX) injection  40 mg Subcutaneous Q24H  . folic acid  1 mg Oral Daily  . metroNIDAZOLE  500 mg Oral Q8H  . multivitamin with minerals  1 tablet Oral Daily  . potassium chloride  40 mEq Oral Daily  . thiamine  100 mg Oral Daily   Or  . thiamine  100 mg Intravenous Daily   Continuous Infusions: . sodium chloride 100 mL/hr at 09/19/20 1759  . ceFEPime (MAXIPIME) IV 2 g (09/20/20 8338)  . vancomycin 1,000 mg (09/20/20 0335)     LOS: 6 days    Time spent: 25 minutes spent in the coordination of care today.    Jonnie Finner, DO [facility]  If 7PM-7AM, please  contact night-coverage [email or web address] 09/20/2020, 12:48 PM

## 2020-09-20 NOTE — Interval H&P Note (Signed)
History and Physical Interval Note:  09/20/2020 3:08 PM  Seth [name]  has presented today for surgery, with the diagnosis of PVD.  The various methods of treatment have been discussed with the patient and family. After consideration of risks, benefits and other options for treatment, the patient has consented to  Procedure(s): ABDOMINAL AORTOGRAM W/LOWER EXTREMITY (N/A) as a surgical intervention.  The patient's history has been reviewed, patient examined, no change in status, stable for surgery.  I have reviewed the patient's chart and labs.  Questions were answered to the patient's satisfaction.     Servando Snare

## 2020-09-20 NOTE — Progress Notes (Signed)
Pharmacy Antibiotic Note  Seth [name] is a 65 y.o. male admitted on 09/14/2020 with sepsis secondary to wound on left foot. Pharmacy has been consulted for vancomcyin dosing. PMH significant for HTN, BPH, gout, COPD, hx CVA, depression, alcohol dependence, tobacco use.  Ortho has seen and recommends VVS consult. Amputation likely needed. His scr has improved to 0.89   Plan: Continue vanc at 1000 mg q12 - Predicted AUC 515, Scr 1.03 - Goal AUC 400-550  Continue cefepime at 2g IV q8  Height: 6' (182.9 cm) Weight: 68.4 kg (150 lb 12.7 oz) IBW/kg (Calculated) : 77.6  Temp (24hrs), Avg:98.6 F (37 C), Min:98.2 F (36.8 C), Max:99.5 F (37.5 C)  Recent Labs  Lab 09/14/20 1530 09/14/20 1708 09/15/20 0820 09/16/20 0552 09/17/20 0157 09/18/20 0521 09/19/20 0201 09/20/20 0201  WBC  --   --  12.6* 12.2* 11.3* 8.0 8.6 8.8  CREATININE  --   --  1.00 1.03 0.96 0.86 0.91 0.89  LATICACIDVEN 2.0* 1.9 1.3  --   --   --   --   --     Estimated Creatinine Clearance: 82.2 mL/min (by C-G formula based on SCr of 0.89 mg/dL).    No Known Allergies  Antimicrobials this admission: Ceftriaxone 1/18 x1 Vancomcyin 1/18>> Cefepime 1/18>> Flagyl 1/18>> Dose adjustments this admission: Vancomycin 1500mg  q24>>1000mg  IV q12h  Microbiology results: 1/18BCx: ngtd 1/18UCx: ngF 1/18 UA: neg for UTI 1/18 Respiratory PCR: COVID positive  Seth [name] A. Levada Dy, PharmD, BCPS, FNKF Clinical Pharmacist [place] Please utilize Amion for appropriate phone number to reach the unit pharmacist ([place])   09/20/2020 9:41 AM

## 2020-09-21 ENCOUNTER — Encounter (HOSPITAL_COMMUNITY)
Admission: EM | Disposition: A | Discharge status: Home Health | Payer: Self-pay | Source: Home / Self Care | Attending: Internal Medicine

## 2020-09-21 ENCOUNTER — Encounter (HOSPITAL_COMMUNITY): Payer: Self-pay | Admitting: Vascular Surgery

## 2020-09-21 ENCOUNTER — Other Ambulatory Visit: Payer: Self-pay | Admitting: Physician Assistant

## 2020-09-21 DIAGNOSIS — A419 Sepsis, unspecified organism: Secondary | ICD-10-CM | POA: Diagnosis not present

## 2020-09-21 DIAGNOSIS — M869 Osteomyelitis, unspecified: Secondary | ICD-10-CM | POA: Diagnosis not present

## 2020-09-21 DIAGNOSIS — U071 COVID-19: Secondary | ICD-10-CM | POA: Diagnosis not present

## 2020-09-21 LAB — CBC WITH DIFFERENTIAL/PLATELET
Abs Immature Granulocytes: 0.06 10*3/uL (ref 0.00–0.07)
Basophils Absolute: 0 10*3/uL (ref 0.0–0.1)
Basophils Relative: 0 %
Eosinophils Absolute: 0.2 10*3/uL (ref 0.0–0.5)
Eosinophils Relative: 2 %
HCT: 24.8 % — ABNORMAL LOW (ref 39.0–52.0)
Hemoglobin: 7.8 g/dL — ABNORMAL LOW (ref 13.0–17.0)
Immature Granulocytes: 1 %
Lymphocytes Relative: 12 %
Lymphs Abs: 1.1 10*3/uL (ref 0.7–4.0)
MCH: 28.9 pg (ref 26.0–34.0)
MCHC: 31.5 g/dL (ref 30.0–36.0)
MCV: 91.9 fL (ref 80.0–100.0)
Monocytes Absolute: 1.6 10*3/uL — ABNORMAL HIGH (ref 0.1–1.0)
Monocytes Relative: 18 %
Neutro Abs: 6.1 10*3/uL (ref 1.7–7.7)
Neutrophils Relative %: 67 %
Platelets: 464 10*3/uL — ABNORMAL HIGH (ref 150–400)
RBC: 2.7 MIL/uL — ABNORMAL LOW (ref 4.22–5.81)
RDW: 21.5 % — ABNORMAL HIGH (ref 11.5–15.5)
WBC: 9 10*3/uL (ref 4.0–10.5)
nRBC: 0 % (ref 0.0–0.2)

## 2020-09-21 LAB — POCT ACTIVATED CLOTTING TIME: Activated Clotting Time: 291 seconds

## 2020-09-21 LAB — COMPREHENSIVE METABOLIC PANEL
ALT: 9 U/L (ref 0–44)
AST: 15 U/L (ref 15–41)
Albumin: 2 g/dL — ABNORMAL LOW (ref 3.5–5.0)
Alkaline Phosphatase: 50 U/L (ref 38–126)
Anion gap: 8 (ref 5–15)
BUN: 6 mg/dL — ABNORMAL LOW (ref 8–23)
CO2: 17 mmol/L — ABNORMAL LOW (ref 22–32)
Calcium: 8.8 mg/dL — ABNORMAL LOW (ref 8.9–10.3)
Chloride: 107 mmol/L (ref 98–111)
Creatinine, Ser: 0.91 mg/dL (ref 0.61–1.24)
GFR, Estimated: >60 mL/min (ref >60)
Glucose, Bld: 113 mg/dL — ABNORMAL HIGH (ref 70–99)
Potassium: 4.2 mmol/L (ref 3.5–5.1)
Sodium: 132 mmol/L — ABNORMAL LOW (ref 135–145)
Total Bilirubin: 0.8 mg/dL (ref 0.3–1.2)
Total Protein: 5.6 g/dL — ABNORMAL LOW (ref 6.5–8.1)

## 2020-09-21 LAB — MAGNESIUM: Magnesium: 1.9 mg/dL (ref 1.7–2.4)

## 2020-09-21 SURGERY — AMPUTATION, FOOT, TRANSMETATARSAL
Anesthesia: Choice | Site: Toe | Laterality: Left

## 2020-09-21 MED ORDER — SODIUM CHLORIDE 0.9 % IV BOLUS: 500.0000 mL | Freq: Once | INTRAVENOUS | Status: DC

## 2020-09-21 MED ORDER — CEFAZOLIN SODIUM-DEXTROSE 2-4 GM/100ML-% IV SOLN: 2.0000 g | INTRAVENOUS | Status: DC

## 2020-09-21 MED ORDER — SODIUM CHLORIDE 0.9 % IV SOLN: Freq: Once | INTRAVENOUS | Status: DC

## 2020-09-21 NOTE — Care Management Important Message (Signed)
Important Message  Patient Details  Name: Seth [name] MRN: [id number] Date of Birth: 1956-05-08   Medicare Important Message Given:  Yes     [name] 09/21/2020, 11:26 AM

## 2020-09-21 NOTE — Progress Notes (Addendum)
  Progress Note    09/21/2020 7:32 AM 1 Day Post-Op  Subjective:  No complaints   Vitals:   09/21/20 0330 09/21/20 0500  BP: (!) 139/109 131/84  Pulse: 100   Resp: 20 20  Temp: 97.6 F (36.4 C)   SpO2: 97%    Physical Exam: Cardiac: regular Lungs: non labored Extremities:  Bilateral lower extremities well perfused and warm. Right groin access site clean, dry intact. No swelling or hematoma. 2+ left femoral pulse, left popliteal pulse and palpable DP. Left foot dressings clean and dry Neurologic: alert and oriented  CBC    Component Value Date/Time   WBC 9.0 09/21/2020 0238   RBC 2.70 (L) 09/21/2020 0238   HGB 7.8 (L) 09/21/2020 0238   HGB 12.1 (L) 05/08/2018 1317   HCT 24.8 (L) 09/21/2020 0238   HCT 28.2 (L) 02/21/2019 0554   PLT 464 (H) 09/21/2020 0238   PLT 225 05/08/2018 1317   MCV 91.9 09/21/2020 0238   MCV 100 (H) 05/08/2018 1317   MCH 28.9 09/21/2020 0238   MCHC 31.5 09/21/2020 0238   RDW 21.5 (H) 09/21/2020 0238   RDW 17.5 (H) 05/08/2018 1317   LYMPHSABS 1.1 09/21/2020 0238   LYMPHSABS 1.7 05/08/2018 1317   MONOABS 1.6 (H) 09/21/2020 0238   EOSABS 0.2 09/21/2020 0238   EOSABS 0.2 05/08/2018 1317   BASOSABS 0.0 09/21/2020 0238   BASOSABS 0.0 05/08/2018 1317    BMET    Component Value Date/Time   NA 132 (L) 09/21/2020 0238   NA 145 (H) 05/08/2018 1317   K 4.2 09/21/2020 0238   CL 107 09/21/2020 0238   CO2 17 (L) 09/21/2020 0238   GLUCOSE 113 (H) 09/21/2020 0238   BUN 6 (L) 09/21/2020 0238   BUN 8 05/08/2018 1317   CREATININE 0.91 09/21/2020 0238   CALCIUM 8.8 (L) 09/21/2020 0238   GFRNONAA >60 09/21/2020 0238   GFRAA >60 06/03/2019 0514    INR    Component Value Date/Time   INR 1.1 09/14/2020 1508     Intake/Output Summary (Last 24 hours) at 09/21/2020 0732 Last data filed at 09/21/2020 0540 Gross per 24 hour  Intake 900.65 ml  Output 1850 ml  Net -949.35 ml     Assessment/Plan:  65 y.o. male is s/p Aortogram with bilateral lower  extremity runoff. Laser arthrectomy left above and below-knee popliteal arteries with 1.5 mm [place]. Plain balloon angioplasty below-knee popliteal artery with 4 mm balloon. Stent of above-knee popliteal artery with a Elluvia drug-eluting stent 6 x 40 mm 1 Day Post-Op. LLE well perfused and warm post angiogram. Palpable femoral, popliteal and DP pulse in left foot. Hemodynamically stable. Will need toe amputation vs TMA now that he is revascularized. Will defer to Dr. Donzetta Matters Dr. Scot Dock on timing of this  DVT prophylaxis:     Marval [facility] [phone] 09/21/2020 7:32 AM   I have interviewed the patient and examined the patient. I agree with the findings by the PA.  No hematoma right groin.  The left foot is warm.  Dr. Sharol Given plans left transmetatarsal amputation tomorrow.  Vascular surgery will be available as needed.  I have arranged follow-up with me in 3 months with a lower extremity duplex and ABIs.  Gae Gallop, MD [phone]

## 2020-09-21 NOTE — Progress Notes (Signed)
PROGRESS NOTE    [name]  KGM:[id number] DOB: [birth year] DOA: 09/14/2020 PCP: Lucianne Lei, MD   Brief Narrative:   Seth [name] a 65 y.o.malewithhistory of alcohol abuse, hypertension and COPD was found to be confused while patient was shopping and was brought to the ER. At the time of my exam patient has become more alert awake. He states he has noticed that his left foot swelling with some ulceration on the distal phalanx for the last 1 month denies any trauma or insect bites. Denies taking any medications. Admits to drinking alcohol daily. In the ER patient was confusion initially CT head was unremarkable. Patient was febrile with temperature of 102 F x-rays of the left foot shows features concerning for osteomyelitis of the distal phalanx. Patient had blood cultures drawn and started on empiric antibiotics. Admitted for acute encephalopathy and possible developing sepsis from osteomyelitis cellulitis of the left foot. COVID test was positive but patient was not hypoxic. Labs show hemoglobin of 10. Lactic acid was 2 which improved to 1.9.  1/25: s/p arteriogram and angioplasty. Appreciate vascular assistance. To go to OR tomorrow for L TMA. Denies complaints this morning. Says his cough and breathing are improved.    Assessment & Plan: Sepsis from left foot osteomyelitis and cellulitis.  - MRI: Focal area of ulceration overlying the first digit with evidence of osteomyelitis of the distal first phalanx. No loculated fluid collection..  - continue cefepime/flagyl/vanc - ortho onboard, possible amputation - ABI is abnormal - 1/25: s/p arteriogram and angioplasty; To OR tomorrow for L TMA.  COVID-19 infection - Unclear if acute or incidentally positive from older infection - Not hypoxic and chest x-ray unremarkable.  - CRP elevated but likely secondary to above sepsis/infection - increasing cough 1/23, but no fever;  inflammatory markers and O2 need are fine; rpt CXR is clear     - 1/25: cough is resolved; continue current regimen  Alcohol abuse  - on CIWA protocol.  - counseled against further EtOH use     - 1/25: No signs of withdrawal  Acute encephalopathy  - Secondary to sepsis; resolved with supportive care  COPD, not in acute exacerbation - stable, follow  HTN - not on meds at home; BP remains acceptable  Hypokalemia Hypomagnesemia Hyponatremia - K+/Mg2+ ok; fluids  Normocytic anemia - no evidence of bleed, follow; transfuse for hgb <7.     -  1/25: Hgb is stable, follow  DVT prophylaxis: lovenox Code Status: FULL Family Communication: None at bedside.   Status is: Inpatient  Remains inpatient appropriate because:Inpatient level of care appropriate due to severity of illness   Dispo: The patient is from: Home              Anticipated d/c is to: Home              Anticipated d/c date is: 3 days              Patient currently is not medically stable to d/c.   Difficult to place patient No  Consultants:   Vascular surgery  Orthopedics  Procedures:   Arteriogram, angioplasty 09/20/20  Antimicrobials:  . Cefepime . Flagyl . Vanc   Subjective: "I haven't had any more cough."  Objective: Vitals:   09/21/20 0300 09/21/20 0330 09/21/20 0500 09/21/20 0534  BP:  (!) 139/109 131/84   Pulse:  100    Resp: 19 20 20    Temp:  97.6 F (36.4 C)    TempSrc:  Oral    SpO2:  97%    Weight:    69.4 kg  Height:        Intake/Output Summary (Last 24 hours) at 09/21/2020 0734 Last data filed at 09/21/2020 0540 Gross per 24 hour  Intake 900.65 ml  Output 1850 ml  Net -949.35 ml   Filed Weights   09/20/20 0435 09/20/20 1740 09/21/20 0534  Weight: 68.4 kg 69.6 kg 69.4 kg    Examination:  General: 65 y.o. male resting in bed in NAD Eyes: PERRL, normal sclera ENMT: Nares patent w/o discharge, orophaynx clear, dentition normal, ears  w/o discharge/lesions/ulcers Neck: Supple, trachea midline Cardiovascular: RRR, +S1, S2, no m/g/r Respiratory: CTABL, no w/r/r, normal WOB GI: BS+, NDNT, no masses noted, no organomegaly noted MSK: No e/c/c, L foot ulcer Skin: No rashes, bruises, ulcerations noted Neuro: A&O x 3, no focal deficits Psyc: Appropriate interaction and affect, calm/cooperative   Data Reviewed: I have personally reviewed following labs and imaging studies.  CBC: Recent Labs  Lab 09/17/20 0157 09/18/20 0521 09/19/20 0201 09/20/20 0201 09/21/20 0238  WBC 11.3* 8.0 8.6 8.8 9.0  NEUTROABS 7.6 4.8 5.2 5.2 6.1  HGB 8.8* 7.9* 7.6* 7.7* 7.8*  HCT 28.5* 24.6* 24.7* 23.7* 24.8*  MCV 94.4 91.4 92.9 92.6 91.9  PLT 466* 430* 442* 490* [id number]*   Basic Metabolic Panel: Recent Labs  Lab 09/17/20 0157 09/18/20 0521 09/19/20 0201 09/19/20 1725 09/20/20 0201 09/21/20 0238  NA 136 138 136  --  137 132*  K 3.9 3.4* 3.3*  --  3.8 4.2  CL 103 108 109  --  111 107  CO2 19* 19* 17*  --  18* 17*  GLUCOSE 85 90 110*  --  87 113*  BUN 8 7* 8  --  <5* 6*  CREATININE 0.96 0.86 0.91  --  0.89 0.91  CALCIUM 9.0 8.5* 8.6*  --  8.9 8.8*  MG  --   --   --  1.6*  --  1.9   GFR: Estimated Creatinine Clearance: 81.6 mL/min (by C-G formula based on SCr of 0.91 mg/dL). Liver Function Tests: Recent Labs  Lab 09/17/20 0157 09/18/20 0521 09/19/20 0201 09/20/20 0201 09/21/20 0238  AST 15 10* 12* 12* 15  ALT 10 8 9 8 9   ALKPHOS 62 52 51 50 50  BILITOT 0.9 0.5 0.8 0.7 0.8  PROT 6.1* 5.2* 5.3* 5.5* 5.6*  ALBUMIN 2.3* 2.0* 2.0* 2.0* 2.0*   No results for input(s): LIPASE, AMYLASE in the last 168 hours. Recent Labs  Lab 09/15/20 0820  AMMONIA 29   Coagulation Profile: Recent Labs  Lab 09/14/20 1508  INR 1.1   Cardiac Enzymes: No results for input(s): CKTOTAL, CKMB, CKMBINDEX, TROPONINI in the last 168 hours. BNP (last 3 results) No results for input(s): PROBNP in the last 8760 hours. HbA1C: No results for  input(s): HGBA1C in the last 72 hours. CBG: Recent Labs  Lab 09/19/20 0814  GLUCAP 87   Lipid Profile: No results for input(s): CHOL, HDL, LDLCALC, TRIG, CHOLHDL, LDLDIRECT in the last 72 hours. Thyroid Function Tests: No results for input(s): TSH, T4TOTAL, FREET4, T3FREE, THYROIDAB in the last 72 hours. Anemia Panel: No results for input(s): VITAMINB12, FOLATE, FERRITIN, TIBC, IRON, RETICCTPCT in the last 72 hours. Sepsis Labs: Recent Labs  Lab 09/14/20 1530 09/14/20 1708 09/15/20 0820  PROCALCITON  --   --  0.14  LATICACIDVEN 2.0* 1.9 1.3    Recent Results (from the past 240 hour(s))  Culture, blood (  Routine x 2)     Status: None   Collection Time: 09/14/20  3:08 PM   Specimen: BLOOD  Result Value Ref Range Status   Specimen Description BLOOD SITE NOT SPECIFIED  Final   Special Requests   Final    BOTTLES DRAWN AEROBIC AND ANAEROBIC Blood Culture results may not be optimal due to an inadequate volume of blood received in culture bottles   Culture   Final    NO GROWTH 5 DAYS Performed at [facility], [place] [address] [place], [place] [address]    Report Status 09/19/2020 FINAL  Final  Resp Panel by RT-PCR (Flu A&B, Covid) Nasopharyngeal Swab     Status: Abnormal   Collection Time: 09/14/20  3:12 PM   Specimen: Nasopharyngeal Swab; Nasopharyngeal(NP) swabs in vial transport medium  Result Value Ref Range Status   SARS Coronavirus 2 by RT PCR POSITIVE (A) NEGATIVE Final    Comment: RESULT CALLED TO, READ BACK BY AND VERIFIED WITH: K PATE RN 1630 09/14/20 A BROWNING (NOTE) SARS-CoV-2 target nucleic acids are DETECTED.  The SARS-CoV-2 RNA is generally detectable in upper respiratory specimens during the acute phase of infection. Positive results are indicative of the presence of the identified virus, but do not rule out bacterial infection or co-infection with other pathogens not detected by the test. Clinical correlation with patient history and other diagnostic  information is necessary to determine patient infection status. The expected result is Negative.  Fact Sheet for Patients: EntrepreneurPulse.com.au  Fact Sheet for Healthcare Providers: IncredibleEmployment.be  This test is not yet approved or cleared by the [place] FDA and  has been authorized for detection and/or diagnosis of SARS-CoV-2 by FDA under an Emergency Use Authorization (EUA).  This EUA will remain in effect (meaning this test can be  used) for the duration of  the COVID-19 declaration under Section 564(b)(1) of the Act, 21 U.S.C. section 360bbb-3(b)(1), unless the authorization is terminated or revoked sooner.     Influenza A by PCR NEGATIVE NEGATIVE Final   Influenza B by PCR NEGATIVE NEGATIVE Final    Comment: (NOTE) The Xpert Xpress SARS-CoV-2/FLU/RSV plus assay is intended as an aid in the diagnosis of influenza from Nasopharyngeal swab specimens and should not be used as a sole basis for treatment. Nasal washings and aspirates are unacceptable for Xpert Xpress SARS-CoV-2/FLU/RSV testing.  Fact Sheet for Patients: EntrepreneurPulse.com.au  Fact Sheet for Healthcare Providers: IncredibleEmployment.be  This test is not yet approved or cleared by the [place] FDA and has been authorized for detection and/or diagnosis of SARS-CoV-2 by FDA under an Emergency Use Authorization (EUA). This EUA will remain in effect (meaning this test can be used) for the duration of the COVID-19 declaration under Section 564(b)(1) of the Act, 21 U.S.C. section 360bbb-3(b)(1), unless the authorization is terminated or revoked.  Performed at [facility], [place] [address] [place], [place] [address]   Culture, blood (Routine x 2)     Status: None   Collection Time: 09/14/20  7:31 PM   Specimen: BLOOD LEFT HAND  Result Value Ref Range Status   Specimen Description BLOOD LEFT HAND  Final    Special Requests   Final    BOTTLES DRAWN AEROBIC ONLY Blood Culture results may not be optimal due to an inadequate volume of blood received in culture bottles   Culture   Final    NO GROWTH 5 DAYS Performed at [facility], [place] [address], [place], [place]  [id number]    Report Status 09/19/2020 FINAL  Final  Urine culture     Status: None   Collection Time: 09/14/20 10:38 PM   Specimen: In/Out Cath Urine  Result Value Ref Range Status   Specimen Description IN/OUT CATH URINE  Final   Special Requests NONE  Final   Culture   Final    NO GROWTH Performed at [facility], [place] 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 Donzetta Matters, MD Procedure Performed: 1.  Ultrasound-guided cannulation right common femoral artery 2.  Aortogram with bilateral lower extremity runoff 3.  Laser arthrectomy left above and below-knee popliteal arteries with 1.5 mm [place] 4.  Plain balloon angioplasty below-knee popliteal artery with 4 mm balloon 5.  Stent of above-knee popliteal artery with a Elluvia drug-eluting stent 6 x 40 mm 6.  Moderate sedation with fentanyl and Versed for 64 minutes 7.  Mynx device closure right common femoral artery Indications: 65 year old male admitted with Covid has gangrenous changes to the left toes.  He has severely decreased ABIs with monophasic waveforms indicated for aortogram with bilateral lower extremity angiography and possible intervention of the left. Findings: The aorta and iliac segments are free of flow-limiting stenosis.  Renal arteries bilaterally are patent without flow-limiting stenosis.  The right side the SFA and  popliteal arteries are patent.  He appears to have two-vessel runoff which is diseased however without flow limitation via the anterior tibial and posterior tibial artery.  The peroneal artery appears occluded at its origin.  On the left side the SFA is patent up to the abductor and there is a heavily calcified area measuring 3 cm.  Below the knee there is a 90% stenosis measuring 1 cm.  He has single-vessel runoff via the anterior tibial artery which has multiple less than 20% stenoses throughout its course does fill the foot.  After intervention with stenting there is less than 20% stenosis of the above-knee popliteal and after balloon angioplasty below the knee there is 0% residual stenosis without dissection. Patient will need amputation of multiple toes on the left versus transmetatarsal amputation.  Procedure:  The patient was identified in the holding area and taken to room 8.  The patient was then placed supine on the table and prepped and draped in the usual sterile fashion.  A time out was called.  Ultrasound was used to evaluate the right common femoral artery.  This artery was large was compressible and appeared to be free of flow-limiting stenosis or any disease.  The area was anesthetized 1% lidocaine and cannulated with micropuncture needle followed by wire and sheath.  An image was saved the permanent record.  Moderate sedation was administered with fentanyl and Versed his vital signs and heart rhythm were monitored throughout the case.  We placed a Bentson wire followed by 5 [place] sheath and an Omni catheter to the level of L1 and performed aortogram followed by lower extremity runoff.  We then crossed the bifurcation with Omni catheter Glidewire advantage placed a long 6 French sheath into the SFA patient was fully heparinized.  We then crossed the lesions above the knee and below the knee popliteal with Glidewire advantage  and quick cross catheter.  We confirmed intraluminal access we were also  able to get better imaging of his foot and distal anterior tibial artery.  We then performed laser arthrectomy of the above and below-knee popliteal arteries followed by 4 mm balloon angioplasty.  We then primarily stented the above-knee popliteal with a 6 x 40 mm drug-eluting stent and postdilated with a 5 mm balloon.  Completion demonstrated that the area was heavily calcified however the stent was wide open in 2 views.  The lesion below the knee was widely patent with no residual stenosis and no dissection.  We good flow into the foot via the anterior tibial artery which also filled the peroneal at the ankle.  Satisfied with this we exchanged for a short 6 French sheath deployed a minx device which deployed well.  He tolerated procedure without any complication. Contrast: 130 cc Brandon C. Donzetta Matters, MD [facility] Office: [phone] Pager: [phone]   DG CHEST PORT 1 VIEW  Result Date: 09/19/2020 CLINICAL DATA:  65 year old male with cough.  Follow-up sepsis. EXAM: PORTABLE CHEST 1 VIEW COMPARISON:  Chest radiograph dated 09/14/2020. FINDINGS: Background of emphysema. No focal consolidation, pleural effusion, pneumothorax. Chronic right lung base pleural thickening/scarring. The cardiac silhouette is within limits. No acute osseous pathology. IMPRESSION: No active disease. Electronically Signed   By: Anner Crete M.D.   On: 09/19/2020 18:50     Scheduled Meds: . clopidogrel  300 mg Oral Once   Followed by  . clopidogrel  75 mg Oral Q breakfast  . enoxaparin (LOVENOX) injection  40 mg Subcutaneous Q24H  . folic acid  1 mg Oral Daily  . metroNIDAZOLE  500 mg Oral Q8H  . multivitamin with minerals  1 tablet Oral Daily  . potassium chloride  40 mEq Oral Daily  . rosuvastatin  10 mg Oral Daily  . sodium chloride flush  3 mL Intravenous Q12H  . thiamine  100 mg Oral Daily   Or  . thiamine  100 mg Intravenous Daily   Continuous Infusions: . sodium chloride 100  mL/hr at 09/20/20 1547  . sodium chloride    . ceFEPime (MAXIPIME) IV 2 g (09/21/20 0533)  . vancomycin 1,000 mg (09/21/20 0131)     LOS: 7 days    Time spent: 25 minutes spent in the coordination of care today.    Jonnie Finner, DO [facility]  If 7PM-7AM, please contact night-coverage [email or web address] 09/21/2020, 7:34 AM

## 2020-09-21 NOTE — H&P (View-Only) (Signed)
Patient ID: Seth [name], male   DOB: 09/10/1955, 65 y.o.   MRN: [id number] Patient is seen in follow-up for gangrene of the toes with osteomyelitis great toe and third toe left foot.  Patient is status post endovascular revascularization yesterday.  Patient has a palpable anterior tibial pulse.  We will plan for a left transmetatarsal amputation tomorrow, Wednesday on the left.  Risk and benefits were discussed including risk of the wound not healing need for additional surgery.  We will plan for his surgery at 730 and discussed that he will need to be nonweightbearing for several weeks postoperatively until the skin heals. 

## 2020-09-21 NOTE — Progress Notes (Signed)
Patient ID: Seth [name], male   DOB: [birth year], 65 y.o.   MRN: [id number] Patient is seen in follow-up for gangrene of the toes with osteomyelitis great toe and third toe left foot.  Patient is status post endovascular revascularization yesterday.  Patient has a palpable anterior tibial pulse.  We will plan for a left transmetatarsal amputation tomorrow, Wednesday on the left.  Risk and benefits were discussed including risk of the wound not healing need for additional surgery.  We will plan for his surgery at 51 and discussed that he will need to be nonweightbearing for several weeks postoperatively until the skin heals.

## 2020-09-21 NOTE — Progress Notes (Signed)
Brown spots on pts hands ands bottom of feet, pt stated he didnt have this prior to coming in.will notify pharmacy and MD. Will continue to monitor   Patient reported tenderness at IV site. IV flushed, but painfully. Redness around IV site on right FA Iv removed and new access on left proximal forearm. Albin Felling Yoali Conry, RN  And Trinna Balloon, RN, BSN

## 2020-09-22 ENCOUNTER — Inpatient Hospital Stay (HOSPITAL_COMMUNITY): Payer: Medicare (Managed Care) | Admitting: Certified Registered"

## 2020-09-22 ENCOUNTER — Encounter (HOSPITAL_COMMUNITY)
Admission: EM | Disposition: A | Discharge status: Home Health | Payer: Self-pay | Source: Home / Self Care | Attending: Internal Medicine

## 2020-09-22 DIAGNOSIS — U071 COVID-19: Secondary | ICD-10-CM | POA: Diagnosis not present

## 2020-09-22 DIAGNOSIS — F1020 Alcohol dependence, uncomplicated: Secondary | ICD-10-CM | POA: Diagnosis not present

## 2020-09-22 DIAGNOSIS — A419 Sepsis, unspecified organism: Secondary | ICD-10-CM | POA: Diagnosis not present

## 2020-09-22 DIAGNOSIS — M869 Osteomyelitis, unspecified: Secondary | ICD-10-CM | POA: Diagnosis not present

## 2020-09-22 DIAGNOSIS — M86172 Other acute osteomyelitis, left ankle and foot: Secondary | ICD-10-CM

## 2020-09-22 HISTORY — PX: AMPUTATION: SHX166

## 2020-09-22 LAB — COMPREHENSIVE METABOLIC PANEL
ALT: 10 U/L (ref 0–44)
AST: 11 U/L — ABNORMAL LOW (ref 15–41)
Albumin: 2.1 g/dL — ABNORMAL LOW (ref 3.5–5.0)
Alkaline Phosphatase: 54 U/L (ref 38–126)
Anion gap: 9 (ref 5–15)
BUN: 5 mg/dL — ABNORMAL LOW (ref 8–23)
CO2: 17 mmol/L — ABNORMAL LOW (ref 22–32)
Calcium: 8.7 mg/dL — ABNORMAL LOW (ref 8.9–10.3)
Chloride: 109 mmol/L (ref 98–111)
Creatinine, Ser: 0.92 mg/dL (ref 0.61–1.24)
GFR, Estimated: >60 mL/min (ref >60)
Glucose, Bld: 91 mg/dL (ref 70–99)
Potassium: 4.1 mmol/L (ref 3.5–5.1)
Sodium: 135 mmol/L (ref 135–145)
Total Bilirubin: 0.7 mg/dL (ref 0.3–1.2)
Total Protein: 5.8 g/dL — ABNORMAL LOW (ref 6.5–8.1)

## 2020-09-22 LAB — CBC WITH DIFFERENTIAL/PLATELET
Abs Immature Granulocytes: 0.04 10*3/uL (ref 0.00–0.07)
Basophils Absolute: 0 10*3/uL (ref 0.0–0.1)
Basophils Relative: 0 %
Eosinophils Absolute: 0.2 10*3/uL (ref 0.0–0.5)
Eosinophils Relative: 2 %
HCT: 26.6 % — ABNORMAL LOW (ref 39.0–52.0)
Hemoglobin: 8.2 g/dL — ABNORMAL LOW (ref 13.0–17.0)
Immature Granulocytes: 0 %
Lymphocytes Relative: 15 %
Lymphs Abs: 1.4 10*3/uL (ref 0.7–4.0)
MCH: 28.5 pg (ref 26.0–34.0)
MCHC: 30.8 g/dL (ref 30.0–36.0)
MCV: 92.4 fL (ref 80.0–100.0)
Monocytes Absolute: 1.7 10*3/uL — ABNORMAL HIGH (ref 0.1–1.0)
Monocytes Relative: 18 %
Neutro Abs: 6 10*3/uL (ref 1.7–7.7)
Neutrophils Relative %: 65 %
Platelets: 512 10*3/uL — ABNORMAL HIGH (ref 150–400)
RBC: 2.88 MIL/uL — ABNORMAL LOW (ref 4.22–5.81)
RDW: 21.5 % — ABNORMAL HIGH (ref 11.5–15.5)
WBC: 9.4 10*3/uL (ref 4.0–10.5)
nRBC: 0 % (ref 0.0–0.2)

## 2020-09-22 LAB — FERRITIN: Ferritin: 894 ng/mL — ABNORMAL HIGH (ref 24–336)

## 2020-09-22 LAB — C-REACTIVE PROTEIN: CRP: 3.3 mg/dL — ABNORMAL HIGH (ref <1.0)

## 2020-09-22 LAB — D-DIMER, QUANTITATIVE: D-Dimer, Quant: 0.43 ug/mL-FEU (ref 0.00–0.50)

## 2020-09-22 LAB — MAGNESIUM: Magnesium: 1.8 mg/dL (ref 1.7–2.4)

## 2020-09-22 SURGERY — AMPUTATION, FOOT, PARTIAL
Anesthesia: General | Site: Foot | Laterality: Left

## 2020-09-22 MED ORDER — PHENYLEPHRINE HCL (PRESSORS) 10 MG/ML IV SOLN
INTRAVENOUS | Status: DC | PRN
  Administered 2020-09-22: 120 ug via INTRAVENOUS
  Administered 2020-09-22: 80 ug via INTRAVENOUS

## 2020-09-22 MED ORDER — ALBUMIN HUMAN 5 % IV SOLN: INTRAVENOUS | Status: DC | PRN

## 2020-09-22 MED ORDER — ONDANSETRON HCL 4 MG/2ML IJ SOLN
INTRAMUSCULAR | Status: DC | PRN
  Administered 2020-09-22: 4 mg via INTRAVENOUS

## 2020-09-22 MED ORDER — PROPOFOL 10 MG/ML IV BOLUS
INTRAVENOUS | Status: DC | PRN
  Administered 2020-09-22: 130 mg via INTRAVENOUS

## 2020-09-22 MED ORDER — FENTANYL CITRATE (PF) 100 MCG/2ML IJ SOLN
INTRAMUSCULAR | Status: AC
  Filled 2020-09-22: qty 4

## 2020-09-22 MED ORDER — CEFAZOLIN SODIUM-DEXTROSE 2-3 GM-%(50ML) IV SOLR
INTRAVENOUS | Status: DC | PRN
  Administered 2020-09-22: 2 g via INTRAVENOUS

## 2020-09-22 MED ORDER — MIDAZOLAM HCL 2 MG/2ML IJ SOLN
INTRAMUSCULAR | Status: AC
  Filled 2020-09-22: qty 2

## 2020-09-22 MED ORDER — LACTATED RINGERS IV SOLN: INTRAVENOUS | Status: DC | PRN

## 2020-09-22 MED ORDER — 0.9 % SODIUM CHLORIDE (POUR BTL) OPTIME
TOPICAL | Status: DC | PRN
  Administered 2020-09-22: 1000 mL

## 2020-09-22 MED ORDER — GLYCOPYRROLATE 0.2 MG/ML IJ SOLN
INTRAMUSCULAR | Status: DC | PRN
  Administered 2020-09-22: .2 mg via INTRAVENOUS

## 2020-09-22 MED ORDER — ONDANSETRON HCL 4 MG/2ML IJ SOLN: 4.0000 mg | Freq: Four times a day (QID) | INTRAMUSCULAR | Status: DC | PRN

## 2020-09-22 MED ORDER — DEXAMETHASONE SODIUM PHOSPHATE 10 MG/ML IJ SOLN
INTRAMUSCULAR | Status: DC | PRN
  Administered 2020-09-22: 10 mg via INTRAVENOUS

## 2020-09-22 MED ORDER — SODIUM CHLORIDE 0.9 % IV SOLN: INTRAVENOUS | Status: DC

## 2020-09-22 MED ORDER — OXYCODONE HCL 5 MG/5ML PO SOLN: 5.0000 mg | Freq: Once | ORAL | Status: DC | PRN

## 2020-09-22 MED ORDER — DOCUSATE SODIUM 100 MG PO CAPS
100.0000 mg | ORAL_CAPSULE | Freq: Two times a day (BID) | ORAL | Status: DC
  Administered 2020-09-22 – 2020-09-27 (×3): 100 mg via ORAL
  Filled 2020-09-22 (×7): qty 1

## 2020-09-22 MED ORDER — PROPOFOL 10 MG/ML IV BOLUS
INTRAVENOUS | Status: AC
  Filled 2020-09-22: qty 20

## 2020-09-22 MED ORDER — FENTANYL CITRATE (PF) 100 MCG/2ML IJ SOLN: 25.0000 ug | INTRAMUSCULAR | Status: DC | PRN

## 2020-09-22 MED ORDER — MIDAZOLAM HCL 2 MG/2ML IJ SOLN
INTRAMUSCULAR | Status: DC | PRN
  Administered 2020-09-22: 2 mg via INTRAVENOUS

## 2020-09-22 MED ORDER — OXYCODONE HCL 5 MG PO TABS
ORAL_TABLET | ORAL | Status: AC
  Filled 2020-09-22: qty 1

## 2020-09-22 MED ORDER — OXYCODONE HCL 5 MG PO TABS: 5.0000 mg | ORAL_TABLET | Freq: Once | ORAL | Status: DC | PRN

## 2020-09-22 MED ORDER — LIDOCAINE HCL (CARDIAC) PF 100 MG/5ML IV SOSY
PREFILLED_SYRINGE | INTRAVENOUS | Status: DC | PRN
  Administered 2020-09-22: 100 mg via INTRATRACHEAL

## 2020-09-22 SURGICAL SUPPLY — 36 items
APL SKNCLS STERI-STRIP NONHPOA (GAUZE/BANDAGES/DRESSINGS) ×1
BENZOIN TINCTURE PRP APPL 2/3 (GAUZE/BANDAGES/DRESSINGS) ×2 IMPLANT
BLADE SAW SGTL HD 18.5X60.5X1. (BLADE) ×2 IMPLANT
BLADE SURG 21 STRL SS (BLADE) ×2 IMPLANT
BNDG COHESIVE 4X5 TAN STRL (GAUZE/BANDAGES/DRESSINGS) ×1 IMPLANT
BNDG GAUZE ELAST 4 BULKY (GAUZE/BANDAGES/DRESSINGS) ×1 IMPLANT
CANISTER WOUNDNEG PRESSURE 500 (CANNISTER) ×1 IMPLANT
COVER SURGICAL LIGHT HANDLE (MISCELLANEOUS) ×2 IMPLANT
COVER WAND RF STERILE (DRAPES) ×2 IMPLANT
DRAPE DERMATAC (DRAPES) ×2 IMPLANT
DRAPE INCISE IOBAN 66X45 STRL (DRAPES) ×2 IMPLANT
DRAPE U-SHAPE 47X51 STRL (DRAPES) ×2 IMPLANT
DRESSING PREVENA PLUS CUSTOM (GAUZE/BANDAGES/DRESSINGS) IMPLANT
DRSG PAD ABDOMINAL 8X10 ST (GAUZE/BANDAGES/DRESSINGS) ×1 IMPLANT
DRSG PREVENA PLUS CUSTOM (GAUZE/BANDAGES/DRESSINGS) ×2
DURAPREP 26ML APPLICATOR (WOUND CARE) ×2 IMPLANT
ELECT REM PT RETURN 9FT ADLT (ELECTROSURGICAL) ×2
ELECTRODE REM PT RTRN 9FT ADLT (ELECTROSURGICAL) ×1 IMPLANT
GAUZE SPONGE 4X4 12PLY STRL (GAUZE/BANDAGES/DRESSINGS) ×1 IMPLANT
GLOVE BIOGEL PI IND STRL 9 (GLOVE) ×1 IMPLANT
GLOVE BIOGEL PI INDICATOR 9 (GLOVE) ×1
GLOVE SURG ORTHO 9.0 STRL STRW (GLOVE) ×2 IMPLANT
GOWN STRL REUS W/ TWL XL LVL3 (GOWN DISPOSABLE) ×3 IMPLANT
GOWN STRL REUS W/TWL XL LVL3 (GOWN DISPOSABLE) ×6
KIT BASIN OR (CUSTOM PROCEDURE TRAY) ×2 IMPLANT
KIT TURNOVER KIT B (KITS) ×2 IMPLANT
NS IRRIG 1000ML POUR BTL (IV SOLUTION) ×2 IMPLANT
PACK ORTHO EXTREMITY (CUSTOM PROCEDURE TRAY) ×2 IMPLANT
PAD ARMBOARD 7.5X6 YLW CONV (MISCELLANEOUS) ×4 IMPLANT
SPONGE LAP 18X18 RF (DISPOSABLE) IMPLANT
SUT ETHILON 2 0 PSLX (SUTURE) ×4 IMPLANT
TOWEL GREEN STERILE (TOWEL DISPOSABLE) ×2 IMPLANT
TOWEL GREEN STERILE FF (TOWEL DISPOSABLE) ×2 IMPLANT
TUBE CONNECTING 12X1/4 (SUCTIONS) ×2 IMPLANT
WATER STERILE IRR 1000ML POUR (IV SOLUTION) ×2 IMPLANT
YANKAUER SUCT BULB TIP NO VENT (SUCTIONS) ×2 IMPLANT

## 2020-09-22 NOTE — Progress Notes (Signed)
Mobility Specialist - Progress Note   09/22/20 1622  Mobility  Activity Refused mobility   Pt states he is fatigued from PT earlier and just received his meal. Will f/u as able.   Pricilla Handler Mobility Specialist Mobility Specialist Phone: [phone]

## 2020-09-22 NOTE — Op Note (Signed)
09/22/2020  10:15 AM  PATIENT:  Seth [name]    PRE-OPERATIVE DIAGNOSIS:  Left Foot Osteomyelitis  POST-OPERATIVE DIAGNOSIS:  Same  PROCEDURE:  LEFT TRANSMETATARSAL AMPUTATION  SURGEON:  Newt Minion, MD  PHYSICIAN ASSISTANT:None ANESTHESIA:   General  PREOPERATIVE INDICATIONS:  [name] is a  65 y.o. male with a diagnosis of Left Foot Osteomyelitis who failed conservative measures and elected for surgical management.    The risks benefits and alternatives were discussed with the patient preoperatively including but not limited to the risks of infection, bleeding, nerve injury, cardiopulmonary complications, the need for revision surgery, among others, and the patient was willing to proceed.  OPERATIVE IMPLANTS: VAC  @ENCIMAGES @  OPERATIVE FINDINGS: Good petechial bleeding along the wound edges no abscess at the surgical resection margins.  OPERATIVE PROCEDURE: Patient was brought the operating room and underwent a general LMA anesthetic.  After adequate levels anesthesia were obtained patient's left lower extremity was prepped using DuraPrep draped into a sterile field a timeout was called.  A fishmouth incision was made just proximal to the ulcerative necrotic tissue.  This was carried down to bone and an oscillating saw was used to perform a transmetatarsal amputation.  There was good petechial bleeding electrocautery was used for hemostasis the wound was irrigated with normal saline the incision was closed using 2-0 nylon a 13 cm Prevena wound VAC was applied this had a good suction fit this was overwrapped with derma tack and Covan patient was extubated recovered in the operating room secondary to Covid.   DISCHARGE PLANNING:  Antibiotic duration: Continue IV antibiotics for 24 hours  Weightbearing: Nonweightbearing on the left  Pain medication: Opioid pathway  Dressing care/ Wound VAC: Continue wound VAC for 1 week  Ambulatory devices: Crutches or  Stroebel.  Discharge to: Anticipate discharge to home.  Follow-up: In the office 1 week post operative.

## 2020-09-22 NOTE — Evaluation (Signed)
Physical Therapy Evaluation Patient Details Name: Seth [name] MRN: [id number] DOB: 06-14-1956 Today's Date: 09/22/2020   History of Present Illness  Seth [name] is a 65 y.o. male with history of alcohol abuse, hypertension and COPD was found to be confused while patient was shopping and was brought to the ER.  At the time of my exam patient has become more alert awake.  He states he has noticed that his left foot swelling with some ulceration on the distal phalanx for the last 1 month denies any trauma or insect bites. Admitted for acute encephalopathy and sepsis from osteomyelitis of L foot. 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 Patient tested positive for COVID after arrival to ED.  Clinical Impression  PTA, patient lives alone and reports ambulating with [facility] and sometimes needs help getting out of tub. Patient is supervision for bed mobility. Patient required minA for sit to stand transfer with RW. Patient ambulated 2'fwd/2'bwd with RW and min guard, good adherence to NWB on L throughout. Patient presents with impaired balance, generalized weakness, decreased activity tolerance. Patient will benefit from skilled PT services during acute stay to address listed deficits. Recommend SNF following discharge as patient lives by himself and does not have 24 hour assistance available.     Follow Up Recommendations SNF;Supervision/Assistance - 24 hour    Equipment Recommendations  Other (comment) (defer to post acute rehab)    Recommendations for Other Services       Precautions / Restrictions Precautions Precautions: Fall Precaution Comments: wound vac Required Braces or Orthoses: Other Brace Other Brace: L post op shoe Restrictions Weight Bearing Restrictions: Yes      Mobility  Bed Mobility Overal bed mobility: Needs Assistance Bed Mobility: Supine to Sit;Sit to Supine     Supine to sit: Supervision Sit to supine: Supervision   General bed mobility comments:  supervision for safety    Transfers Overall transfer level: Needs assistance Equipment used: Rolling [place] (2 wheeled) Transfers: Sit to/from Stand Sit to Stand: Min assist         General transfer comment: minA for boost up to standing, cues for hand placement  Ambulation/Gait Ambulation/Gait assistance: Min guard Gait Distance (Feet): 4 Feet Assistive device: Rolling [place] (2 wheeled) Gait Pattern/deviations: Step-to pattern (hop to pattern)     General Gait Details: 2' fwd/2' bwd with good adherence to NWB on L  Stairs            Wheelchair Mobility    Modified Rankin (Stroke Patients Only)       Balance Overall balance assessment: Needs assistance Sitting-balance support: No upper extremity supported;Feet supported Sitting balance-[name] Scale: Good     Standing balance support: Bilateral upper extremity supported;During functional activity Standing balance-[name] Scale: Poor Standing balance comment: reliant on UE support                             Pertinent Vitals/Pain Pain Assessment: No/denies pain    Home Living Family/patient expects to be discharged to:: Private residence Living Arrangements: Alone Available Help at Discharge: Family;Available PRN/intermittently Type of Home: House Home Access: Level entry     Home Layout: One level Home Equipment: Ost - 2 wheels;Cane - single point;Grab bars - tub/shower;Tub bench      Prior Function Level of Independence: Independent with assistive device(s)         Comments: patient states he uses [facility] for mobility and that his daughter and sister  help him get out of the tub about once a week     Hand Dominance        Extremity/Trunk Assessment   Upper Extremity Assessment Upper Extremity Assessment: Defer to OT evaluation    Lower Extremity Assessment Lower Extremity Assessment: Generalized weakness       Communication   Communication: No difficulties  Cognition  Arousal/Alertness: Awake/alert Behavior During Therapy: WFL for tasks assessed/performed Overall Cognitive Status: No family/caregiver present to determine baseline cognitive functioning                                 General Comments: A&O to self, place and time      General Comments General comments (skin integrity, edema, etc.): resting HR 128, increased to 157 with activity    Exercises     Assessment/Plan    PT Assessment Patient needs continued PT services  PT Problem List Decreased strength;Decreased activity tolerance;Decreased balance;Decreased mobility;Decreased knowledge of use of DME;Decreased knowledge of precautions       PT Treatment Interventions DME instruction;Gait training;Therapeutic activities;Therapeutic exercise;Functional mobility training;Balance training;Patient/family education    PT Goals (Current goals can be found in the Care Plan section)  Acute Rehab PT Goals Patient Stated Goal: to go home PT Goal Formulation: With patient Time For Goal Achievement: 10/06/20 Potential to Achieve Goals: Fair    Frequency Min 2X/week   Barriers to discharge        Co-evaluation               AM-PAC PT "6 Clicks" Mobility  Outcome Measure Help needed turning from your back to your side while in a flat bed without using bedrails?: A Little Help needed moving from lying on your back to sitting on the side of a flat bed without using bedrails?: A Little Help needed moving to and from a bed to a chair (including a wheelchair)?: A Little Help needed standing up from a chair using your arms (e.g., wheelchair or bedside chair)?: A Little Help needed to walk in hospital room?: A Little Help needed climbing 3-5 steps with a railing? : A Lot 6 Click Score: 17    End of Session Equipment Utilized During Treatment: Gait belt Activity Tolerance: Patient tolerated treatment well Patient left: in bed;with call bell/phone within reach Nurse  Communication: Mobility status PT Visit Diagnosis: Unsteadiness on feet (R26.81);Muscle weakness (generalized) (M62.81);Other abnormalities of gait and mobility (R26.89)    Time: 3086-5784 PT Time Calculation (min) (ACUTE ONLY): 28 min   Charges:   PT Evaluation $PT Eval Moderate Complexity: 1 Mod PT Treatments $Therapeutic Activity: 8-22 mins        Ash Mcelwain A. Gilford Rile PT, DPT Acute Rehabilitation Services Pager [phone] Office [phone]   Alda Lea 09/22/2020, 3:16 PM

## 2020-09-22 NOTE — Anesthesia Procedure Notes (Signed)
Procedure Name: LMA Insertion Date/Time: 09/22/2020 9:38 AM Performed by: Claris Che, CRNA Pre-anesthesia Checklist: Patient identified, Emergency Drugs available, Suction available, Patient being monitored and Timeout performed Patient Re-evaluated:Patient Re-evaluated prior to induction Oxygen Delivery Method: Circle system utilized Preoxygenation: Pre-oxygenation with 100% oxygen Induction Type: IV induction Ventilation: Mask ventilation without difficulty LMA Size: 5.0 Number of attempts: 1 Placement Confirmation: positive ETCO2 and breath sounds checked- equal and bilateral Tube secured with: Tape Dental Injury: Teeth and Oropharynx as per pre-operative assessment

## 2020-09-22 NOTE — Anesthesia Postprocedure Evaluation (Signed)
Anesthesia Post Note  Patient: Seth [name]  Procedure(s) Performed: LEFT TRANSMETATARSAL AMPUTATION (Left Foot)     Patient location during evaluation: PACU Anesthesia Type: General Level of consciousness: sedated Pain management: pain level controlled Vital Signs Assessment: post-procedure vital signs reviewed and stable Respiratory status: spontaneous breathing and respiratory function stable Cardiovascular status: stable Postop Assessment: no apparent nausea or vomiting Anesthetic complications: no   No complications documented.  Last Vitals:  Vitals:   09/22/20 1041 09/22/20 1044  BP: 121/82 125/79  Pulse:    Resp: 18   Temp: 36.6 C   SpO2: 100%     Last Pain:  Vitals:   09/22/20 1041  TempSrc:   PainSc: 0-No pain                 Thomas Rhude DANIEL

## 2020-09-22 NOTE — Interval H&P Note (Signed)
History and Physical Interval Note:  09/22/2020 6:49 AM  Seth [name]  has presented today for surgery, with the diagnosis of Left Foot Osteomyelitis.  The various methods of treatment have been discussed with the patient and family. After consideration of risks, benefits and other options for treatment, the patient has consented to  Procedure(s): LEFT TRANSMETATARSAL AMPUTATION (Left) as a surgical intervention.  The patient's history has been reviewed, patient examined, no change in status, stable for surgery.  I have reviewed the patient's chart and labs.  Questions were answered to the patient's satisfaction.     Newt Minion

## 2020-09-22 NOTE — Progress Notes (Signed)
Received back from PACU, wound vac to L foot CDI, VSS, tele set up.  No complaints of pain at this time

## 2020-09-22 NOTE — Transfer of Care (Signed)
Immediate Anesthesia Transfer of Care Note  Patient: Seth [name]  Procedure(s) Performed: LEFT TRANSMETATARSAL AMPUTATION (Left Foot)  Patient Location: PACU  Anesthesia Type:General  Level of Consciousness: drowsy, patient cooperative and responds to stimulation  Airway & Oxygen Therapy: Patient Spontanous Breathing and Patient connected to nasal cannula oxygen  Post-op Assessment: Report given to RN, Post -op Vital signs reviewed and stable and Patient moving all extremities X 4  Post vital signs: Reviewed and stable  Last Vitals:  Vitals Value Taken Time  BP    Temp    Pulse    Resp    SpO2      Last Pain:  Vitals:   09/22/20 0823  TempSrc: Oral  PainSc:          Complications: No complications documented.

## 2020-09-22 NOTE — Anesthesia Preprocedure Evaluation (Signed)
Anesthesia Evaluation  Patient identified by MRN, date of birth, ID band Patient awake    Reviewed: Allergy & Precautions, H&P , NPO status , Patient's Chart, lab work & pertinent test results  Airway Mallampati: II   Neck ROM: full    Dental   Pulmonary COPD, Current Smoker,    breath sounds clear to auscultation       Cardiovascular hypertension, + DOE   Rhythm:regular Rate:Normal     Neuro/Psych PSYCHIATRIC DISORDERS Depression CVA    GI/Hepatic   Endo/Other    Renal/GU      Musculoskeletal   Abdominal   Peds  Hematology  (+) Blood dyscrasia, anemia ,   Anesthesia Other Findings   Reproductive/Obstetrics                             Anesthesia Physical Anesthesia Plan  ASA: III  Anesthesia Plan: General   Post-op Pain Management:    Induction: Intravenous  PONV Risk Score and Plan: 1 and Ondansetron and Midazolam  Airway Management Planned: LMA  Additional Equipment:   Intra-op Plan:   Post-operative Plan: Extubation in OR  Informed Consent: I have reviewed the patients History and Physical, chart, labs and discussed the procedure including the risks, benefits and alternatives for the proposed anesthesia with the patient or authorized representative who has indicated his/her understanding and acceptance.     Dental advisory given  Plan Discussed with: CRNA, Anesthesiologist and Surgeon  Anesthesia Plan Comments:         Anesthesia Quick Evaluation

## 2020-09-22 NOTE — Progress Notes (Addendum)
Orthopedic Tech Progress Note Patient Details:  Seth [name] 08/04/1956 [id number] Handed POST OP SHOE to [place] while he was on his way into patient room Ortho Devices Type of Ortho Device: Postop shoe/boot Ortho Device/Splint Location: LLE Ortho Device/Splint Interventions: Application,Ordered,Adjustment   Post Interventions Patient Tolerated: Well Instructions Provided: Care of device   Janit Pagan 09/22/2020, 2:19 PM

## 2020-09-22 NOTE — Progress Notes (Signed)
PROGRESS NOTE    Seth [name]  WPV:[id number] DOB: 1956-01-29 DOA: 09/14/2020 PCP: Renaye Rakers, MD   Brief Narrative:   Seth [name] a 65 y.o.malewithhistory of alcohol abuse, hypertension and COPD was found to be confused while patient was shopping and was brought to the ER. At the time of my exam patient has become more alert awake. He states he has noticed that his left foot swelling with some ulceration on the distal phalanx for the last 1 month denies any trauma or insect bites. Denies taking any medications. Admits to drinking alcohol daily. In the ER patient was confusion initially CT head was unremarkable. Patient was febrile with temperature of 102 F x-rays of the left foot shows features concerning for osteomyelitis of the distal phalanx. Patient had blood cultures drawn and started on empiric antibiotics. Admitted for acute encephalopathy and possible developing sepsis from osteomyelitis cellulitis of the left foot. COVID test was positive but patient was not hypoxic. Labs show hemoglobin of 10. Lactic acid was 2 which improved to 1.9.  1/25: s/p arteriogram and angioplasty. Appreciate vascular assistance. To go to OR tomorrow for L TMA. Denies complaints this morning. Says his cough and breathing are improved.    Assessment & Plan:  Sepsis from left foot osteomyelitis and cellulitis, POA  - MRI: Focal area of ulceration overlying the first digit with evidence of osteomyelitis of the distal first phalanx. No loculated fluid collection..  - continue cefepime/flagyl/vanc - ortho/vascular onboard, L transmetatarsal amputation - ABI is abnormal - 1/25: s/p arteriogram and angioplasty; To OR this am for L transmetatarsal amputation  COVID-19 infection - Unclear if acute or incidentally positive from older infection - Not hypoxic and chest x-ray unremarkable.  - CRP elevated but likely secondary to above sepsis/infection     -  1/27: Patient to come off contact precautions due to minimal/incidental infection and completed 10 days of airborne precautions/quarantine  Alcohol abuse  - on CIWA protocol.  - counseled against further EtOH use     - 1/25: No signs of withdrawal  Acute encephalopathy  - Secondary to sepsis; resolved with supportive care     - Less likely etoh withdrawal given symptoms  COPD, not in acute exacerbation - stable, follow  HTN - not on meds at home; BP remains acceptable  Hypokalemia Hypomagnesemia Hyponatremia - K+/Mg2+ ok; fluids  Normocytic anemia - no evidence of bleed, follow; transfuse for hgb <7.     -  1/25: Hgb is stable, follow  DVT prophylaxis: lovenox Code Status: FULL Family Communication: None at bedside.   Status is: Inpatient  Remains inpatient appropriate because:Inpatient level of care appropriate due to severity of illness   Dispo: The patient is from: Home              Anticipated d/c is to: Home              Anticipated d/c date is: 3 days              Patient currently is not medically stable to d/c.   Difficult to place patient No  Consultants:   Vascular surgery  Orthopedics  Procedures:   Arteriogram, angioplasty 09/20/20  Antimicrobials:  . Cefepime . Flagyl . Vanc   Subjective: No acute issues or events overnight denies nausea, vomiting, diarrhea, constipation, headache, fever, chills, cough, sputum production, chest pain.  Somewhat anxious for procedure but otherwise only complaint is being thirsty  Objective: Vitals:   09/22/20 0011 09/22/20 0343  09/22/20 0353 09/22/20 0623  BP:  (!) 156/89 (!) 156/89   Pulse:   94   Resp:   18 20  Temp: 98.7 F (37.1 C)  98.7 F (37.1 C)   TempSrc:   Oral   SpO2:   100%   Weight:    69.3 kg  Height:       No intake or output data in the 24 hours ending 09/22/20 0812 Filed Weights   09/20/20 1740 09/21/20 0534 09/22/20 0623  Weight: 69.6 kg 69.4 kg  69.3 kg    Examination:  General:  Pleasantly resting in bed, No acute distress. HEENT:  Normocephalic atraumatic.  Sclerae nonicteric, noninjected.  Extraocular movements intact bilaterally. Neck:  Without mass or deformity.  Trachea is midline. Lungs:  Clear to auscultate bilaterally without rhonchi, wheeze, or rales. Heart:  Regular rate and rhythm.  Without murmurs, rubs, or gallops. Abdomen:  Soft, nontender, nondistended.  Without guarding or rebound. Extremities: Without cyanosis, clubbing, edema, L foot bandage clean/dry/intact.  Data Reviewed: I have personally reviewed following labs and imaging studies.  CBC: Recent Labs  Lab 09/18/20 0521 09/19/20 0201 09/20/20 0201 09/21/20 0238 09/22/20 0111  WBC 8.0 8.6 8.8 9.0 9.4  NEUTROABS 4.8 5.2 5.2 6.1 6.0  HGB 7.9* 7.6* 7.7* 7.8* 8.2*  HCT 24.6* 24.7* 23.7* 24.8* 26.6*  MCV 91.4 92.9 92.6 91.9 92.4  PLT 430* 442* 490* 464* 518*   Basic Metabolic Panel: Recent Labs  Lab 09/18/20 0521 09/19/20 0201 09/19/20 1725 09/20/20 0201 09/21/20 0238 09/22/20 0111  NA 138 136  --  137 132* 135  K 3.4* 3.3*  --  3.8 4.2 4.1  CL 108 109  --  111 107 109  CO2 19* 17*  --  18* 17* 17*  GLUCOSE 90 110*  --  87 113* 91  BUN 7* 8  --  <5* 6* 5*  CREATININE 0.86 0.91  --  0.89 0.91 0.92  CALCIUM 8.5* 8.6*  --  8.9 8.8* 8.7*  MG  --   --  1.6*  --  1.9 1.8   GFR: Estimated Creatinine Clearance: 80.6 mL/min (by C-G formula based on SCr of 0.92 mg/dL). Liver Function Tests: Recent Labs  Lab 09/18/20 0521 09/19/20 0201 09/20/20 0201 09/21/20 0238 09/22/20 0111  AST 10* 12* 12* 15 11*  ALT 8 9 8 9 10   ALKPHOS 52 51 50 50 54  BILITOT 0.5 0.8 0.7 0.8 0.7  PROT 5.2* 5.3* 5.5* 5.6* 5.8*  ALBUMIN 2.0* 2.0* 2.0* 2.0* 2.1*   No results for input(s): LIPASE, AMYLASE in the last 168 hours. Recent Labs  Lab 09/15/20 0820  AMMONIA 29   Coagulation Profile: No results for input(s): INR, PROTIME in the last 168 hours. Cardiac  Enzymes: No results for input(s): CKTOTAL, CKMB, CKMBINDEX, TROPONINI in the last 168 hours. BNP (last 3 results) No results for input(s): PROBNP in the last 8760 hours. HbA1C: No results for input(s): HGBA1C in the last 72 hours. CBG: Recent Labs  Lab 09/19/20 0814  GLUCAP 87   Lipid Profile: No results for input(s): CHOL, HDL, LDLCALC, TRIG, CHOLHDL, LDLDIRECT in the last 72 hours. Thyroid Function Tests: No results for input(s): TSH, T4TOTAL, FREET4, T3FREE, THYROIDAB in the last 72 hours. Anemia Panel: Recent Labs    09/22/20 0111  FERRITIN 894*   Sepsis Labs: Recent Labs  Lab 09/15/20 0820  PROCALCITON 0.14  LATICACIDVEN 1.3    Recent Results (from the past 240 hour(s))  Culture, blood (Routine  x 2)     Status: None   Collection Time: 09/14/20  3:08 PM   Specimen: BLOOD  Result Value Ref Range Status   Specimen Description BLOOD SITE NOT SPECIFIED  Final   Special Requests   Final    BOTTLES DRAWN AEROBIC AND ANAEROBIC Blood Culture results may not be optimal due to an inadequate volume of blood received in culture bottles   Culture   Final    NO GROWTH 5 DAYS Performed at [facility], [place] [address], [place], [place] [address]    Report Status 09/19/2020 FINAL  Final  Resp Panel by RT-PCR (Flu A&B, Covid) Nasopharyngeal Swab     Status: Abnormal   Collection Time: 09/14/20  3:12 PM   Specimen: Nasopharyngeal Swab; Nasopharyngeal(NP) swabs in vial transport medium  Result Value Ref Range Status   SARS Coronavirus 2 by RT PCR POSITIVE (A) NEGATIVE Final    Comment: RESULT CALLED TO, READ BACK BY AND VERIFIED WITH: K PATE RN 1630 09/14/20 A BROWNING (NOTE) SARS-CoV-2 target nucleic acids are DETECTED.  The SARS-CoV-2 RNA is generally detectable in upper respiratory specimens during the acute phase of infection. Positive results are indicative of the presence of the identified virus, but do not rule out bacterial infection or co-infection with other  pathogens not detected by the test. Clinical correlation with patient history and other diagnostic information is necessary to determine patient infection status. The expected result is Negative.  Fact Sheet for Patients: EntrepreneurPulse.com.au  Fact Sheet for Healthcare Providers: IncredibleEmployment.be  This test is not yet approved or cleared by the [place] FDA and  has been authorized for detection and/or diagnosis of SARS-CoV-2 by FDA under an Emergency Use Authorization (EUA).  This EUA will remain in effect (meaning this test can be  used) for the duration of  the COVID-19 declaration under Section 564(b)(1) of the Act, 21 U.S.C. section 360bbb-3(b)(1), unless the authorization is terminated or revoked sooner.     Influenza A by PCR NEGATIVE NEGATIVE Final   Influenza B by PCR NEGATIVE NEGATIVE Final    Comment: (NOTE) The Xpert Xpress SARS-CoV-2/FLU/RSV plus assay is intended as an aid in the diagnosis of influenza from Nasopharyngeal swab specimens and should not be used as a sole basis for treatment. Nasal washings and aspirates are unacceptable for Xpert Xpress SARS-CoV-2/FLU/RSV testing.  Fact Sheet for Patients: EntrepreneurPulse.com.au  Fact Sheet for Healthcare Providers: IncredibleEmployment.be  This test is not yet approved or cleared by the [place] FDA and has been authorized for detection and/or diagnosis of SARS-CoV-2 by FDA under an Emergency Use Authorization (EUA). This EUA will remain in effect (meaning this test can be used) for the duration of the COVID-19 declaration under Section 564(b)(1) of the Act, 21 U.S.C. section 360bbb-3(b)(1), unless the authorization is terminated or revoked.  Performed at [facility], [place] [address] [place], [place] [address]   Culture, blood (Routine x 2)     Status: None   Collection Time: 09/14/20  7:31 PM   Specimen:  BLOOD LEFT HAND  Result Value Ref Range Status   Specimen Description BLOOD LEFT HAND  Final   Special Requests   Final    BOTTLES DRAWN AEROBIC ONLY Blood Culture results may not be optimal due to an inadequate volume of blood received in culture bottles   Culture   Final    NO GROWTH 5 DAYS Performed at [facility], [place] [address], [place], [place] [address]  Report Status 09/19/2020 FINAL  Final  Urine culture     Status: None   Collection Time: 09/14/20 10:38 PM   Specimen: In/Out Cath Urine  Result Value Ref Range Status   Specimen Description IN/OUT CATH URINE  Final   Special Requests NONE  Final   Culture   Final    NO GROWTH Performed at [facility], [place] [address], [place], [place] [address]    Report Status 09/16/2020 FINAL  Final      Radiology Studies: PERIPHERAL VASCULAR CATHETERIZATION  Result Date: 09/20/2020 Patient name: OLUWAFEMI LOURY MRN: [id number] DOB: 01-14-1956 Sex: male 09/20/2020 Pre-operative Diagnosis: 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 Donzetta Matters, MD Procedure Performed: 1.  Ultrasound-guided cannulation right common femoral artery 2.  Aortogram with bilateral lower extremity runoff 3.  Laser arthrectomy left above and below-knee popliteal arteries with 1.5 mm [place] 4.  Plain balloon angioplasty below-knee popliteal artery with 4 mm balloon 5.  Stent of above-knee popliteal artery with a Elluvia drug-eluting stent 6 x 40 mm 6.  Moderate sedation with fentanyl and Versed for 64 minutes 7.  Mynx device closure right common femoral artery Indications: 65 year old male admitted with Covid has gangrenous changes to the left toes.  He has severely decreased ABIs with monophasic waveforms indicated for aortogram with bilateral lower extremity angiography and possible intervention of the left. Findings: The aorta and iliac segments are free of flow-limiting stenosis.   Renal arteries bilaterally are patent without flow-limiting stenosis.  The right side the SFA and popliteal arteries are patent.  He appears to have two-vessel runoff which is diseased however without flow limitation via the anterior tibial and posterior tibial artery.  The peroneal artery appears occluded at its origin.  On the left side the SFA is patent up to the abductor and there is a heavily calcified area measuring 3 cm.  Below the knee there is a 90% stenosis measuring 1 cm.  He has single-vessel runoff via the anterior tibial artery which has multiple less than 20% stenoses throughout its course does fill the foot.  After intervention with stenting there is less than 20% stenosis of the above-knee popliteal and after balloon angioplasty below the knee there is 0% residual stenosis without dissection. Patient will need amputation of multiple toes on the left versus transmetatarsal amputation.  Procedure:  The patient was identified in the holding area and taken to room 8.  The patient was then placed supine on the table and prepped and draped in the usual sterile fashion.  A time out was called.  Ultrasound was used to evaluate the right common femoral artery.  This artery was large was compressible and appeared to be free of flow-limiting stenosis or any disease.  The area was anesthetized 1% lidocaine and cannulated with micropuncture needle followed by wire and sheath.  An image was saved the permanent record.  Moderate sedation was administered with fentanyl and Versed his vital signs and heart rhythm were monitored throughout the case.  We placed a Bentson wire followed by 5 [place] sheath and an Omni catheter to the level of L1 and performed aortogram followed by lower extremity runoff.  We then crossed the bifurcation with Omni catheter Glidewire advantage placed a long 6 French sheath into the SFA patient was fully heparinized.  We then crossed the lesions above the knee and below the knee popliteal  with Glidewire advantage and quick cross  catheter.  We confirmed intraluminal access we were also able to get better imaging of his foot and distal anterior tibial artery.  We then performed laser arthrectomy of the above and below-knee popliteal arteries followed by 4 mm balloon angioplasty.  We then primarily stented the above-knee popliteal with a 6 x 40 mm drug-eluting stent and postdilated with a 5 mm balloon.  Completion demonstrated that the area was heavily calcified however the stent was wide open in 2 views.  The lesion below the knee was widely patent with no residual stenosis and no dissection.  We good flow into the foot via the anterior tibial artery which also filled the peroneal at the ankle.  Satisfied with this we exchanged for a short 6 French sheath deployed a minx device which deployed well.  He tolerated procedure without any complication. Contrast: 130 cc Brandon C. Donzetta Matters, MD [facility] Office: [phone] Pager: [phone]     Scheduled Meds: . clopidogrel  300 mg Oral Once   Followed by  . clopidogrel  75 mg Oral Q breakfast  . enoxaparin (LOVENOX) injection  40 mg Subcutaneous Q24H  . folic acid  1 mg Oral Daily  . metroNIDAZOLE  500 mg Oral Q8H  . multivitamin with minerals  1 tablet Oral Daily  . potassium chloride  40 mEq Oral Daily  . rosuvastatin  10 mg Oral Daily  . sodium chloride flush  3 mL Intravenous Q12H  . thiamine  100 mg Oral Daily   Or  . thiamine  100 mg Intravenous Daily   Continuous Infusions: . sodium chloride 100 mL/hr at 09/21/20 1314  . sodium chloride    .  ceFAZolin (ANCEF) IV    . ceFEPime (MAXIPIME) IV 2 g (09/22/20 0659)  . vancomycin 1,000 mg (09/22/20 0557)     LOS: 8 days   Time spent: 25 minutes spent in the coordination of care today.   Little Ishikawa, DO [facility]  If 7PM-7AM, please contact night-coverage [email or web address] 09/22/2020, 8:12 AM

## 2020-09-23 ENCOUNTER — Encounter (HOSPITAL_COMMUNITY): Payer: Self-pay | Admitting: Orthopedic Surgery

## 2020-09-23 DIAGNOSIS — F1020 Alcohol dependence, uncomplicated: Secondary | ICD-10-CM | POA: Diagnosis not present

## 2020-09-23 DIAGNOSIS — A419 Sepsis, unspecified organism: Secondary | ICD-10-CM | POA: Diagnosis not present

## 2020-09-23 DIAGNOSIS — U071 COVID-19: Secondary | ICD-10-CM | POA: Diagnosis not present

## 2020-09-23 LAB — BASIC METABOLIC PANEL
Anion gap: 7 (ref 5–15)
BUN: 12 mg/dL (ref 8–23)
CO2: 16 mmol/L — ABNORMAL LOW (ref 22–32)
Calcium: 8.7 mg/dL — ABNORMAL LOW (ref 8.9–10.3)
Chloride: 112 mmol/L — ABNORMAL HIGH (ref 98–111)
Creatinine, Ser: 1.21 mg/dL (ref 0.61–1.24)
GFR, Estimated: >60 mL/min (ref >60)
Glucose, Bld: 121 mg/dL — ABNORMAL HIGH (ref 70–99)
Potassium: 4.5 mmol/L (ref 3.5–5.1)
Sodium: 135 mmol/L (ref 135–145)

## 2020-09-23 LAB — GLUCOSE, CAPILLARY: Glucose-Capillary: 108 mg/dL — ABNORMAL HIGH (ref 70–99)

## 2020-09-23 LAB — CBC
HCT: 23.7 % — ABNORMAL LOW (ref 39.0–52.0)
Hemoglobin: 7.8 g/dL — ABNORMAL LOW (ref 13.0–17.0)
MCH: 30 pg (ref 26.0–34.0)
MCHC: 32.9 g/dL (ref 30.0–36.0)
MCV: 91.2 fL (ref 80.0–100.0)
Platelets: 535 10*3/uL — ABNORMAL HIGH (ref 150–400)
RBC: 2.6 MIL/uL — ABNORMAL LOW (ref 4.22–5.81)
RDW: 21.4 % — ABNORMAL HIGH (ref 11.5–15.5)
WBC: 15 10*3/uL — ABNORMAL HIGH (ref 4.0–10.5)
nRBC: 0.1 % (ref 0.0–0.2)

## 2020-09-23 MED ORDER — ALUM & MAG HYDROXIDE-SIMETH 200-200-20 MG/5ML PO SUSP
30.0000 mL | ORAL | Status: DC | PRN
  Administered 2020-09-23: 30 mL via ORAL
  Filled 2020-09-23: qty 30

## 2020-09-23 NOTE — Progress Notes (Signed)
Patient is postop day 1 status post left transmetatarsal amputation.  He is doing well comfortable in bed he is Covid positive  Vital signs stable alert pleasant to exam.  Wound VAC is functioning Prevena VAC is in place.  No drainage around same   Stable will need 1 week follow-up with Ortho after discharge from hospital

## 2020-09-23 NOTE — Progress Notes (Signed)
PROGRESS NOTE    Seth [name]  TKZ:[id number] DOB: February 13, 1956 DOA: 09/14/2020 PCP: Lucianne Lei, MD   Brief Narrative:   Seth [name] a 65 y.o.malewithhistory of alcohol abuse, hypertension and COPD was found to be confused while patient was shopping and was brought to the ER. At the time of my exam patient has become more alert awake. He states he has noticed that his left foot swelling with some ulceration on the distal phalanx for the last 1 month denies any trauma or insect bites. Denies taking any medications. Admits to drinking alcohol daily. In the ER patient was confusion initially CT head was unremarkable. Patient was febrile with temperature of 102 F x-rays of the left foot shows features concerning for osteomyelitis of the distal phalanx. Patient had blood cultures drawn and started on empiric antibiotics. Admitted for acute encephalopathy and possible developing sepsis from osteomyelitis cellulitis of the left foot. COVID test was positive but patient was not hypoxic. Labs show hemoglobin of 10. Lactic acid was 2 which improved to 1.9.  1/25: s/p arteriogram and angioplasty.  1/26: L TMA with ortho/vascular 1/27: PT eval recommending SNF vs 24h care at home   Assessment & Plan:  Sepsis from left foot osteomyelitis and cellulitis, POA  - MRI: Focal area of ulceration overlying the first digit with evidence of osteomyelitis of the distal first phalanx. No loculated fluid collection..  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  - CRP elevated but likely secondary to above sepsis/infection     - Patient to come off contact precautions 09/25/20 due to minimal/incidental infection and completed 10 days of airborne  precautions/quarantine  Alcohol abuse  - on CIWA protocol.  - counseled against further EtOH use     - 1/25: No signs of withdrawal  Acute encephalopathy, resolved - Secondary to sepsis; resolved with supportive care     - Less likely etoh withdrawal given symptoms  COPD, not in acute exacerbation - stable, follow  HTN - not on meds at home; BP remains acceptable  DVT prophylaxis: lovenox Code Status: FULL Family Communication: None at bedside.   Status is: Inpatient  Remains inpatient appropriate because:Inpatient level of care appropriate due to severity of illness   Dispo: The patient is from: Home              Anticipated d/c is to: Home              Anticipated d/c date is: 3 days              Patient currently is not medically stable to d/c.   Difficult to place patient No  Consultants:   Vascular surgery  Orthopedics  Procedures:   Arteriogram, angioplasty 09/20/20  Antimicrobials:  . Cefepime, Flagyl, Vanc stopped 09/23/20 per surgery  Subjective: No acute issues or events overnight denies nausea, vomiting, diarrhea, constipation, headache, fever, chills, cough, sputum production, chest pain. Tolerated procedure well yesterday, pain well controlled today.  Objective: Vitals:   09/23/20 0400 09/23/20 0631 09/23/20 0800 09/23/20 1138  BP: 126/74  (!) 141/84 (!) 133/93  Pulse: 76  85 (!) 101  Resp: 20 18 18 17   Temp:   98 F (36.7 C) 98 F (36.7 C)  TempSrc:   Oral Oral  SpO2: 100% 100% 100% 100%  Weight:  69.6 kg    Height:        Intake/Output Summary (Last 24 hours) at 09/23/2020 1616 Last data filed at 09/23/2020 0900 Gross per 24 hour  Intake 4898.14 ml  Output 1150 ml  Net 3748.14 ml   Filed Weights   09/21/20 0534 09/22/20 0623 09/23/20 0631  Weight: 69.4 kg 69.3 kg 69.6 kg    Examination:  General:  Pleasantly resting in bed, No acute distress. HEENT:  Normocephalic atraumatic.  Sclerae nonicteric,  noninjected.  Extraocular movements intact bilaterally. Neck:  Without mass or deformity.  Trachea is midline. Lungs:  Clear to auscultate bilaterally without rhonchi, wheeze, or rales. Heart:  Regular rate and rhythm.  Without murmurs, rubs, or gallops. Abdomen:  Soft, nontender, nondistended.  Without guarding or rebound. Extremities: Without cyanosis, clubbing, edema, L foot bandage clean/dry/intact - wound vac functioning without leak.  Data Reviewed: I have personally reviewed following labs and imaging studies.  CBC: Recent Labs  Lab 09/18/20 0521 09/19/20 0201 09/20/20 0201 09/21/20 0238 09/22/20 0111 09/23/20 0239  WBC 8.0 8.6 8.8 9.0 9.4 15.0*  NEUTROABS 4.8 5.2 5.2 6.1 6.0  --   HGB 7.9* 7.6* 7.7* 7.8* 8.2* 7.8*  HCT 24.6* 24.7* 23.7* 24.8* 26.6* 23.7*  MCV 91.4 92.9 92.6 91.9 92.4 91.2  PLT 430* 442* 490* 464* 512* 703*   Basic Metabolic Panel: Recent Labs  Lab 09/19/20 0201 09/19/20 1725 09/20/20 0201 09/21/20 0238 09/22/20 0111 09/23/20 0239  NA 136  --  137 132* 135 135  K 3.3*  --  3.8 4.2 4.1 4.5  CL 109  --  111 107 109 112*  CO2 17*  --  18* 17* 17* 16*  GLUCOSE 110*  --  87 113* 91 121*  BUN 8  --  <5* 6* 5* 12  CREATININE 0.91  --  0.89 0.91 0.92 1.21  CALCIUM 8.6*  --  8.9 8.8* 8.7* 8.7*  MG  --  1.6*  --  1.9 1.8  --    GFR: Estimated Creatinine Clearance: 61.5 mL/min (by C-G formula based on SCr of 1.21 mg/dL). Liver Function Tests: Recent Labs  Lab 09/18/20 0521 09/19/20 0201 09/20/20 0201 09/21/20 0238 09/22/20 0111  AST 10* 12* 12* 15 11*  ALT 8 9 8 9 10   ALKPHOS 52 51 50 50 54  BILITOT 0.5 0.8 0.7 0.8 0.7  PROT 5.2* 5.3* 5.5* 5.6* 5.8*  ALBUMIN 2.0* 2.0* 2.0* 2.0* 2.1*   No results for input(s): LIPASE, AMYLASE in the last 168 hours. No results for input(s): AMMONIA in the last 168 hours. Coagulation Profile: No results for input(s): INR, PROTIME in the last 168 hours. Cardiac Enzymes: No results for input(s): CKTOTAL, CKMB,  CKMBINDEX, TROPONINI in the last 168 hours. BNP (last 3 results) No results for input(s): PROBNP in the last 8760 hours. HbA1C: No results for input(s): HGBA1C in the last 72 hours. CBG: Recent Labs  Lab 09/19/20 0814 09/23/20 0613  GLUCAP 87 108*   Lipid Profile: No results for input(s): CHOL, HDL, LDLCALC, TRIG, CHOLHDL, LDLDIRECT in the last 72 hours. Thyroid Function Tests: No results for input(s): TSH, T4TOTAL, FREET4, T3FREE, THYROIDAB in the last 72 hours. Anemia Panel: Recent Labs    09/22/20 0111  FERRITIN 894*   Sepsis Labs: No results for input(s): PROCALCITON, LATICACIDVEN in the last 168 hours.  Recent Results (from the past 240 hour(s))  Culture, blood (Routine x 2)     Status: None   Collection Time: 09/14/20  3:08  PM   Specimen: BLOOD  Result Value Ref Range Status   Specimen Description BLOOD SITE NOT SPECIFIED  Final   Special Requests   Final    BOTTLES DRAWN AEROBIC AND ANAEROBIC Blood Culture results may not be optimal due to an inadequate volume of blood received in culture bottles   Culture   Final    NO GROWTH 5 DAYS Performed at [facility], [place] [address], [place], [place] [address]    Report Status 09/19/2020 FINAL  Final  Resp Panel by RT-PCR (Flu A&B, Covid) Nasopharyngeal Swab     Status: Abnormal   Collection Time: 09/14/20  3:12 PM   Specimen: Nasopharyngeal Swab; Nasopharyngeal(NP) swabs in vial transport medium  Result Value Ref Range Status   SARS Coronavirus 2 by RT PCR POSITIVE (A) NEGATIVE Final    Comment: RESULT CALLED TO, READ BACK BY AND VERIFIED WITH: K PATE RN 1630 09/14/20 A BROWNING (NOTE) SARS-CoV-2 target nucleic acids are DETECTED.  The SARS-CoV-2 RNA is generally detectable in upper respiratory specimens during the acute phase of infection. Positive results are indicative of the presence of the identified virus, but do not rule out bacterial infection or co-infection with other pathogens not detected by the  test. Clinical correlation with patient history and other diagnostic information is necessary to determine patient infection status. The expected result is Negative.  Fact Sheet for Patients: EntrepreneurPulse.com.au  Fact Sheet for Healthcare Providers: IncredibleEmployment.be  This test is not yet approved or cleared by the [place] FDA and  has been authorized for detection and/or diagnosis of SARS-CoV-2 by FDA under an Emergency Use Authorization (EUA).  This EUA will remain in effect (meaning this test can be  used) for the duration of  the COVID-19 declaration under Section 564(b)(1) of the Act, 21 U.S.C. section 360bbb-3(b)(1), unless the authorization is terminated or revoked sooner.     Influenza A by PCR NEGATIVE NEGATIVE Final   Influenza B by PCR NEGATIVE NEGATIVE Final    Comment: (NOTE) The Xpert Xpress SARS-CoV-2/FLU/RSV plus assay is intended as an aid in the diagnosis of influenza from Nasopharyngeal swab specimens and should not be used as a sole basis for treatment. Nasal washings and aspirates are unacceptable for Xpert Xpress SARS-CoV-2/FLU/RSV testing.  Fact Sheet for Patients: EntrepreneurPulse.com.au  Fact Sheet for Healthcare Providers: IncredibleEmployment.be  This test is not yet approved or cleared by the [place] FDA and has been authorized for detection and/or diagnosis of SARS-CoV-2 by FDA under an Emergency Use Authorization (EUA). This EUA will remain in effect (meaning this test can be used) for the duration of the COVID-19 declaration under Section 564(b)(1) of the Act, 21 U.S.C. section 360bbb-3(b)(1), unless the authorization is terminated or revoked.  Performed at [facility], [place] [address] [place], [place] [address]   Culture, blood (Routine x 2)     Status: None   Collection Time: 09/14/20  7:31 PM   Specimen: BLOOD LEFT HAND  Result Value  Ref Range Status   Specimen Description BLOOD LEFT HAND  Final   Special Requests   Final    BOTTLES DRAWN AEROBIC ONLY Blood Culture results may not be optimal due to an inadequate volume of blood received in culture bottles   Culture   Final    NO GROWTH 5 DAYS Performed at [facility], [place] [address], [place],  [address]    Report Status 09/19/2020 FINAL  Final  Urine culture  Status: None   Collection Time: 09/14/20 10:38 PM   Specimen: In/Out Cath Urine  Result Value Ref Range Status   Specimen Description IN/OUT CATH URINE  Final   Special Requests NONE  Final   Culture   Final    NO GROWTH Performed at [facility], [place] [address], [place], [place] [address]    Report Status 09/16/2020 FINAL  Final      Radiology Studies: No results found.   Scheduled Meds: . clopidogrel  300 mg Oral Once   Followed by  . clopidogrel  75 mg Oral Q breakfast  . docusate sodium  100 mg Oral BID  . enoxaparin (LOVENOX) injection  40 mg Subcutaneous Q24H  . folic acid  1 mg Oral Daily  . multivitamin with minerals  1 tablet Oral Daily  . potassium chloride  40 mEq Oral Daily  . rosuvastatin  10 mg Oral Daily  . sodium chloride flush  3 mL Intravenous Q12H  . thiamine  100 mg Oral Daily   Or  . thiamine  100 mg Intravenous Daily   Continuous Infusions: . sodium chloride 100 mL/hr at 09/21/20 1314  . sodium chloride    . sodium chloride 75 mL/hr at 09/22/20 1353     LOS: 9 days   Time spent: 25 minutes spent in the coordination of care today.   Little Ishikawa, DO [facility]  If 7PM-7AM, please contact night-coverage [email or web address] 09/23/2020, 4:16 PM

## 2020-09-23 NOTE — Evaluation (Signed)
Occupational Therapy Evaluation Patient Details Name: Seth [name] MRN: [id number] DOB: [birth year] Today's Date: 09/23/2020    History of Present Illness Seth [name] is a 65 y.o. male with history of alcohol abuse, hypertension and COPD was found to be confused while patient was shopping and was brought to the ER.  At the time of my exam patient has become more alert awake.  He states he has noticed that his left foot swelling with some ulceration on the distal phalanx for the last 1 month denies any trauma or insect bites. Admitted for acute encephalopathy and sepsis from osteomyelitis of L foot. 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 Patient tested positive for COVID after arrival to ED.   Clinical Impression   Pt admitted with the above diagnoses and presents with below problem list. Pt will benefit from continued acute OT to address the below listed deficits and maximize independence with basic ADLs to venue below. At baseline, pt is mostly mod I with ADLs, assist from family for tub transfer once a week, lives alone. Pt currently min to mod A with LB ADLs and functional transfers/mobility. Initially pt had difficulty maintaining NWB during sit<>stand transition, improved with practice and cueing for compensatory strategies (pushing up with both hands, bringing right foot back farther).      Follow Up Recommendations  Supervision/Assistance - 24 hour;SNF (home vs SNF pending progress. lives alone.)    Equipment Recommendations  3 in 1 bedside commode    Recommendations for Other Services       Precautions / Restrictions Precautions Precautions: Fall Precaution Comments: wound vac Required Braces or Orthoses: Other Brace Other Brace: L post op shoe Restrictions Weight Bearing Restrictions: Yes LLE Weight Bearing: Non weight bearing Other Position/Activity Restrictions: NWB LLE per op note      Mobility Bed Mobility Overal bed mobility: Needs Assistance Bed  Mobility: Supine to Sit     Supine to sit: Supervision     General bed mobility comments: supervision for safety    Transfers Overall transfer level: Needs assistance Equipment used: Rolling Geyer (2 wheeled) Transfers: Sit to/from Stand Sit to Stand: Mod assist;Min assist         General transfer comment: mod A on intital sit and stand from elevated EOB, difficulty maintaining NWB during transition. Improved to min A and better adherence to NWB with cueing for compensatory strategies. BUE generalized weakness.    Balance Overall balance assessment: Needs assistance Sitting-balance support: No upper extremity supported;Feet supported Sitting balance-[name] Scale: Good     Standing balance support: Bilateral upper extremity supported;During functional activity Standing balance-[name] Scale: Poor Standing balance comment: reliant on UE support                           ADL either performed or assessed with clinical judgement   ADL Overall ADL's : Needs assistance/impaired Eating/Feeding: Set up;Sitting   Grooming: Set up;Sitting   Upper Body Bathing: Set up;Sitting   Lower Body Bathing: Moderate assistance;Sit to/from stand   Upper Body Dressing : Set up;Sitting   Lower Body Dressing: Moderate assistance;Sit to/from stand   Toilet Transfer: Minimal assistance;Moderate assistance;Stand-pivot;BSC;RW   Toileting- Clothing Manipulation and Hygiene: Moderate assistance;Sit to/from Nurse, children's Details (indicate cue type and reason): Pt wound in ACE wrap and on wound vac currently Functional mobility during ADLs: Minimal assistance;Rolling [place] General ADL Comments: Pt struggled with maintaining NWB precaution during initial sit and  stand. With cues for compensatorystrategies able to improve during session from mod to min A on reattempt. Complete functional mobility 5' 2x, seated rest break between reps.     Vision         Perception      Praxis      Pertinent Vitals/Pain Pain Assessment: No/denies pain     Hand Dominance Right   Extremity/Trunk Assessment Upper Extremity Assessment Upper Extremity Assessment: Generalized weakness   Lower Extremity Assessment Lower Extremity Assessment: Defer to PT evaluation       Communication Communication Communication: No difficulties   Cognition Arousal/Alertness: Awake/alert Behavior During Therapy: WFL for tasks assessed/performed Overall Cognitive Status: No family/caregiver present to determine baseline cognitive functioning                                 General Comments: some STM deficits noted   General Comments  HR up to 155 with OOB activity    Exercises     Shoulder Instructions      Home Living Family/patient expects to be discharged to:: Private residence Living Arrangements: Alone Available Help at Discharge: Family;Available PRN/intermittently Type of Home: House Home Access: Level entry     Home Layout: One level     Bathroom Shower/Tub: Teacher, early years/pre: Standard     Home Equipment: Environmental consultant - 2 wheels;Cane - single point;Grab bars - tub/shower;Tub bench          Prior Functioning/Environment Level of Independence: Independent with assistive device(s)        Comments: patient states he uses [facility] for mobility and that his daughter and sister help him get out of the tub about once a week        OT Problem List: Decreased strength;Decreased activity tolerance;Impaired balance (sitting and/or standing);Decreased knowledge of use of DME or AE;Decreased knowledge of precautions;Cardiopulmonary status limiting activity;Pain      OT Treatment/Interventions: Self-care/ADL training;Therapeutic exercise;Energy conservation;DME and/or AE instruction;Therapeutic activities;Patient/family education;Balance training    OT Goals(Current goals can be found in the care plan section) Acute Rehab OT  Goals Patient Stated Goal: be more active, to go home OT Goal Formulation: With patient Time For Goal Achievement: 10/07/20 Potential to Achieve Goals: Good ADL Goals Pt Will Perform Lower Body Bathing: with modified independence;sit to/from stand Pt Will Perform Lower Body Dressing: with modified independence;sit to/from stand Pt Will Transfer to Toilet: with modified independence;ambulating Pt Will Perform Toileting - Clothing Manipulation and hygiene: with modified independence;sit to/from stand  OT Frequency: Min 2X/week   Barriers to D/C:            Co-evaluation              AM-PAC OT "6 Clicks" Daily Activity     Outcome Measure Help from another person eating meals?: None Help from another person taking care of personal grooming?: None Help from another person toileting, which includes using toliet, bedpan, or urinal?: A Lot Help from another person bathing (including washing, rinsing, drying)?: A Lot Help from another person to put on and taking off regular upper body clothing?: None Help from another person to put on and taking off regular lower body clothing?: A Lot 6 Click Score: 18   End of Session Equipment Utilized During Treatment: Gait belt;Rolling Matty;Other (comment) (L foot postop shoe)  Activity Tolerance: Other (comment) (HR up to 155 walking 5'.) Patient left: in chair;with call bell/phone within reach  OT Visit Diagnosis: Unsteadiness on feet (R26.81);Muscle weakness (generalized) (M62.81);Pain                Time: 1233-1300 OT Time Calculation (min): 27 min Charges:  OT General Charges $OT Visit: 1 Visit OT Evaluation $OT Eval Low Complexity: 1 Low OT Treatments $Self Care/Home Management : 8-22 mins  Tyrone Schimke, OT Acute Rehabilitation Services Pager: [phone] Office: [phone]  Hortencia Pilar 09/23/2020, 1:30 PM

## 2020-09-23 NOTE — Progress Notes (Signed)
Mobility Specialist: Progress Note   09/23/20 1619  Mobility  Activity Ambulated in room  Level of Assistance Contact guard assist, steadying assist  Assistive Device Front wheel [place]  Distance Ambulated (ft) 24 ft  Mobility Response Tolerated well  Mobility performed by Mobility specialist  $Mobility charge 1 Mobility   Post-Mobility: 101 HR, 138/87 BP, 100% SpO2  Pt asx during ambulation. NWB on L foot which pt did very well with. Pt back to bed to perform leg extension and hip flexion exercises. Explained to pt exercises he can do in the bed independently.   [facility] Rolfe Hartsell Mobility Specialist Mobility Specialist Phone: [phone]

## 2020-09-23 NOTE — Progress Notes (Signed)
Pharmacy Antibiotic Note  Seth [name] is a 65 y.o. male admitted on 09/14/2020 with sepsis secondary to wound on left foot. Pharmacy has been consulted for vancomcyin dosing. PMH significant for HTN, BPH, gout, COPD, hx CVA, depression, alcohol dependence, tobacco use.  Pt is now POD #1 for transmetatarsal amputation.  Plan: Continue vanc at 1000 mg q12 - Predicted AUC 515, Scr 1.03 - Goal AUC 400-550   Continue cefepime at 2g IV q8 Check vancomycin kinetics with tonight's dose.  Height: 6\' 1"  (185.4 cm) Weight: 69.6 kg (153 lb 7 oz) IBW/kg (Calculated) : 79.9  Temp (24hrs), Avg:97.9 F (36.6 C), Min:97.6 F (36.4 C), Max:98.2 F (36.8 C)  Recent Labs  Lab 09/19/20 0201 09/20/20 0201 09/21/20 0238 09/22/20 0111 09/23/20 0239  WBC 8.6 8.8 9.0 9.4 15.0*  CREATININE 0.91 0.89 0.91 0.92 1.21    Estimated Creatinine Clearance: 61.5 mL/min (by C-G formula based on SCr of 1.21 mg/dL).    No Known Allergies  Antimicrobials this admission: Ceftriaxone 1/18 x1 Vancomcyin 1/18>> Cefepime 1/18>> Flagyl 1/18>>  Dose adjustments this admission: Vancomycin 1500mg  q24>>1000mg  IV q12h  Microbiology results: 1/18BCx: neg 1/18UCx: ngF 1/18 UA: neg for UTI 1/18 Respiratory PCR: COVID positive  [place] Crane, Roylene Reason, [facility] Clinical Pharmacist  09/23/2020 9:43 AM   [facility] pharmacy phone numbers are listed on [email or web address]   09/23/2020 9:42 AM

## 2020-09-24 DIAGNOSIS — F1020 Alcohol dependence, uncomplicated: Secondary | ICD-10-CM | POA: Diagnosis not present

## 2020-09-24 DIAGNOSIS — U071 COVID-19: Secondary | ICD-10-CM | POA: Diagnosis not present

## 2020-09-24 DIAGNOSIS — A419 Sepsis, unspecified organism: Secondary | ICD-10-CM | POA: Diagnosis not present

## 2020-09-24 LAB — CBC
HCT: 23.8 % — ABNORMAL LOW (ref 39.0–52.0)
Hemoglobin: 7.7 g/dL — ABNORMAL LOW (ref 13.0–17.0)
MCH: 29.2 pg (ref 26.0–34.0)
MCHC: 32.4 g/dL (ref 30.0–36.0)
MCV: 90.2 fL (ref 80.0–100.0)
Platelets: 501 10*3/uL — ABNORMAL HIGH (ref 150–400)
RBC: 2.64 MIL/uL — ABNORMAL LOW (ref 4.22–5.81)
RDW: 21.4 % — ABNORMAL HIGH (ref 11.5–15.5)
WBC: 11 10*3/uL — ABNORMAL HIGH (ref 4.0–10.5)
nRBC: 0 % (ref 0.0–0.2)

## 2020-09-24 LAB — BASIC METABOLIC PANEL
Anion gap: 9 (ref 5–15)
BUN: 8 mg/dL (ref 8–23)
CO2: 17 mmol/L — ABNORMAL LOW (ref 22–32)
Calcium: 9.2 mg/dL (ref 8.9–10.3)
Chloride: 112 mmol/L — ABNORMAL HIGH (ref 98–111)
Creatinine, Ser: 1.11 mg/dL (ref 0.61–1.24)
GFR, Estimated: >60 mL/min (ref >60)
Glucose, Bld: 82 mg/dL (ref 70–99)
Potassium: 4.1 mmol/L (ref 3.5–5.1)
Sodium: 138 mmol/L (ref 135–145)

## 2020-09-24 MED ORDER — METOPROLOL TARTRATE 12.5 MG HALF TABLET
12.5000 mg | ORAL_TABLET | Freq: Two times a day (BID) | ORAL | Status: DC
  Administered 2020-09-24 (×2): 12.5 mg via ORAL
  Filled 2020-09-24 (×2): qty 1

## 2020-09-24 NOTE — Progress Notes (Signed)
Mobility Specialist: Progress Note   09/24/20 1552  Mobility  Activity Ambulated in room  Level of Assistance Contact guard assist, steadying assist  Assistive Device Front wheel [place]  Distance Ambulated (ft) 40 ft  Mobility Response Tolerated well  Mobility performed by Mobility specialist  $Mobility charge 1 Mobility   Pre-Mobility: 103 HR During Mobility: 137 HR Post-Mobility: 99 HR, 15/92 BP, 100% SpO2  Pt ambulated in room. Pt did well with NWB on L foot today. Pt back to bed after walk. Pt is motivated to continue working with staff.   [facility] [place] Mobility Specialist Mobility Specialist Phone: [phone]

## 2020-09-24 NOTE — Progress Notes (Signed)
Physical Therapy Treatment Patient Details Name: Seth [name] MRN: [id number] DOB: [birth year] Today's Date: 09/24/2020    History of Present Illness CLIFORD [name] is a 65 y.o. male with history of alcohol abuse, hypertension and COPD was found to be confused while patient was shopping and was brought to the ER.  At the time of my exam patient has become more alert awake.  He states he has noticed that his left foot swelling with some ulceration on the distal phalanx for the last 1 month denies any trauma or insect bites. Admitted for acute encephalopathy and sepsis from osteomyelitis of L foot. 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 Patient tested positive for COVID after arrival to ED.    PT Comments    Pt supine in bed on arrival this session.  Pt limited this session due to elevated HR, unable to progress mobility at this time.  Will f/u per POC.     Follow Up Recommendations  SNF;Supervision/Assistance - 24 hour     Equipment Recommendations  Other (comment) (defer to post acute rehab)    Recommendations for Other Services       Precautions / Restrictions Precautions Precautions: Fall Precaution Comments: wound vac Required Braces or Orthoses: Other Brace Other Brace: L post op shoe Restrictions Weight Bearing Restrictions: Yes LLE Weight Bearing: Non weight bearing Other Position/Activity Restrictions: NWB LLE per op note    Mobility  Bed Mobility Overal bed mobility: Needs Assistance Bed Mobility: Supine to Sit;Sit to Supine     Supine to sit: Supervision Sit to supine: Supervision   General bed mobility comments: Pt sats edge of bed x 5 min, used urinal and returned back to bed as HR elevated to 161 bpm.  Focused on slowing respiratory rate but unable to progress tx further at this time.  Transfers                    Ambulation/Gait                 Stairs             Wheelchair Mobility    Modified Rankin (Stroke  Patients Only)       Balance Overall balance assessment: Needs assistance Sitting-balance support: No upper extremity supported;Feet supported Sitting balance-[name] Scale: Good       Standing balance-[name] Scale: Poor                              Cognition Arousal/Alertness: Awake/alert Behavior During Therapy: WFL for tasks assessed/performed Overall Cognitive Status: No family/caregiver present to determine baseline cognitive functioning                                 General Comments: some STM deficits noted      Exercises      General Comments        Pertinent Vitals/Pain Pain Assessment: No/denies pain    Home Living                      Prior Function            PT Goals (current goals can now be found in the care plan section) Acute Rehab PT Goals Patient Stated Goal: be more active, to go home Potential to Achieve Goals: Fair Progress towards PT goals: Progressing toward goals  Frequency    Min 2X/week      PT Plan Current plan remains appropriate    Co-evaluation              AM-PAC PT "6 Clicks" Mobility   Outcome Measure  Help needed turning from your back to your side while in a flat bed without using bedrails?: A Little Help needed moving from lying on your back to sitting on the side of a flat bed without using bedrails?: A Little Help needed moving to and from a bed to a chair (including a wheelchair)?: A Little Help needed standing up from a chair using your arms (e.g., wheelchair or bedside chair)?: A Little Help needed to walk in hospital room?: A Little Help needed climbing 3-5 steps with a railing? : A Lot 6 Click Score: 17    End of Session Equipment Utilized During Treatment: Gait belt Activity Tolerance: Patient tolerated treatment well Patient left: in bed;with call bell/phone within reach Nurse Communication: Mobility status PT Visit Diagnosis: Unsteadiness on feet  (R26.81);Muscle weakness (generalized) (M62.81);Other abnormalities of gait and mobility (R26.89)     Time: 7829-5621 PT Time Calculation (min) (ACUTE ONLY): 17 min  Charges:  $Therapeutic Activity: 8-22 mins                     Erasmo Leventhal , PTA Acute Rehabilitation Services Pager [phone] Office [phone]     Norma Montemurro [name] 09/24/2020, 2:55 PM

## 2020-09-24 NOTE — TOC Transition Note (Signed)
Transition of Care [facility]) - CM/SW Discharge Note   Patient Details  Name: Seth [name] MRN: [id number] Date of Birth: [birth year]  Transition of Care [facility]) CM/SW Contact:  Vinie Sill, [place] Phone Number: 09/24/2020, 1:42 PM   Clinical Narrative:     CSW spoke with patient by phone. CSW introduced self and explained role. CSW discussed therapy recommendations of short term rehab at [facility]. Patient states he been to rehab before [name]) but his preference is to discharge home. Patient states he lives alone but his daughter,[name] and his lives near by and will be able to assist as needed. Patient declined SNF at this time. Patient may need transportation at discharge. Patient states no questions or concerns at this time.   Thurmond Butts, MSW, LCSW Clinical Social Worker    Final next level of care: [name] Barriers to Discharge: Continued Medical Work up   Patient Goals and CMS Choice        Discharge Placement                       Discharge Plan and Services In-house Referral: Clinical Social Work                                   Social Determinants of Health (SDOH) Interventions     Readmission Risk Interventions Readmission Risk Prevention Plan 06/04/2019  Transportation Screening Complete  PCP or Specialist Appt within 5-7 Days Complete  Home Care Screening Complete  Medication Review (RN CM) Complete  Some recent data might be hidden

## 2020-09-24 NOTE — Progress Notes (Signed)
PROGRESS NOTE    Seth [name]  [id number] DOB: August 25, 1956 DOA: 09/14/2020 PCP: Lucianne Lei, MD   Brief Narrative:   Seth [name] a 65 y.o.malewithhistory of alcohol abuse, hypertension and COPD was found to be confused while patient was shopping and was brought to the ER. At the time of my exam patient has become more alert awake. He states he has noticed that his left foot swelling with some ulceration on the distal phalanx for the last 1 month denies any trauma or insect bites. Denies taking any medications. Admits to drinking alcohol daily. In the ER patient was confusion initially CT head was unremarkable. Patient was febrile with temperature of 102 F x-rays of the left foot shows features concerning for osteomyelitis of the distal phalanx. Patient had blood cultures drawn and started on empiric antibiotics. Admitted for acute encephalopathy and possible developing sepsis from osteomyelitis cellulitis of the left foot. COVID test was positive but patient was not hypoxic. Labs show hemoglobin of 10. Lactic acid was 2 which improved to 1.9.  1/25: s/p arteriogram and angioplasty.  1/26: L TMA with ortho/vascular 1/27-28: PT eval recommending SNF vs 24h care at home given ambulation status with wound vac and new amputation  Assessment & Plan:  Sepsis from left foot osteomyelitis and cellulitis, POA  - MRI: Focal area of ulceration overlying the first digit with evidence of osteomyelitis of the distal first phalanx. No loculated fluid collection..  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  - CRP elevated but likely secondary to above sepsis/infection     - Patient to come off contact precautions 09/25/20 due to  minimal/incidental infection and completed 10 days of airborne precautions/quarantine  Alcohol abuse  - on CIWA protocol.  - counseled against further EtOH use     - No signs of withdrawal  Acute encephalopathy, resolved - Secondary to sepsis; resolved with supportive care     - Less likely etoh withdrawal given symptoms  COPD, not in acute exacerbation - stable, follow  HTN - not on meds at home; BP remains acceptable  DVT prophylaxis: lovenox Code Status: FULL Family Communication: None at bedside.   Status is: Inpatient  Remains inpatient appropriate because:Inpatient level of care appropriate due to severity of illness   Dispo: The patient is from: Home              Anticipated d/c is to: Home with 24h supervision vs SNF              Anticipated d/c date is: 3 days              Patient currently is not medically stable to d/c.   Difficult to place patient No  Consultants:   Vascular surgery  Orthopedics  Procedures:   Arteriogram, angioplasty 09/20/20  Antimicrobials:  . Cefepime, Flagyl, Vanc stopped 09/23/20 per surgery  Subjective: No acute issues or events overnight; pain well controlled; denies nausea, vomiting, diarrhea, constipation, headache, fever, chills, cough, sputum production, chest pain.   Objective: Vitals:   09/23/20 2125 09/23/20 2319 09/24/20 0337 09/24/20 0400  BP:  (!) 141/86 (!) 150/98 133/86  Pulse: 100 91 100 100  Resp: 13 18 16    Temp:  98.3 F (36.8 C) 98.5 F (36.9 C)   TempSrc:  Oral Oral  SpO2: 100% 100% 100%   Weight:   66.4 kg   Height:        Intake/Output Summary (Last 24 hours) at 09/24/2020 0758 Last data filed at 09/24/2020 0342 Gross per 24 hour  Intake 903.24 ml  Output 1400 ml  Net -496.76 ml   Filed Weights   09/22/20 0623 09/23/20 0631 09/24/20 0337  Weight: 69.3 kg 69.6 kg 66.4 kg    Examination:  General:  Pleasantly resting in bed, No acute distress. HEENT:   Normocephalic atraumatic.  Sclerae nonicteric, noninjected.  Extraocular movements intact bilaterally. Neck:  Without mass or deformity.  Trachea is midline. Lungs:  Clear to auscultate bilaterally without rhonchi, wheeze, or rales. Heart:  Regular rate and rhythm.  Without murmurs, rubs, or gallops. Abdomen:  Soft, nontender, nondistended.  Without guarding or rebound. Extremities: Without cyanosis, clubbing, edema; L foot wound vac bandage clean/dry/intact - wound vac functioning without leak.  Data Reviewed: I have personally reviewed following labs and imaging studies.  CBC: Recent Labs  Lab 09/18/20 0521 09/19/20 0201 09/20/20 0201 09/21/20 0238 09/22/20 0111 09/23/20 0239 09/24/20 0247  WBC 8.0 8.6 8.8 9.0 9.4 15.0* 11.0*  NEUTROABS 4.8 5.2 5.2 6.1 6.0  --   --   HGB 7.9* 7.6* 7.7* 7.8* 8.2* 7.8* 7.7*  HCT 24.6* 24.7* 23.7* 24.8* 26.6* 23.7* 23.8*  MCV 91.4 92.9 92.6 91.9 92.4 91.2 90.2  PLT 430* 442* 490* 464* 512* 535* 409*   Basic Metabolic Panel: Recent Labs  Lab 09/19/20 1725 09/20/20 0201 09/21/20 0238 09/22/20 0111 09/23/20 0239 09/24/20 0247  NA  --  137 132* 135 135 138  K  --  3.8 4.2 4.1 4.5 4.1  CL  --  111 107 109 112* 112*  CO2  --  18* 17* 17* 16* 17*  GLUCOSE  --  87 113* 91 121* 82  BUN  --  <5* 6* 5* 12 8  CREATININE  --  0.89 0.91 0.92 1.21 1.11  CALCIUM  --  8.9 8.8* 8.7* 8.7* 9.2  MG 1.6*  --  1.9 1.8  --   --    GFR: Estimated Creatinine Clearance: 64 mL/min (by C-G formula based on SCr of 1.11 mg/dL). Liver Function Tests: Recent Labs  Lab 09/18/20 0521 09/19/20 0201 09/20/20 0201 09/21/20 0238 09/22/20 0111  AST 10* 12* 12* 15 11*  ALT 8 9 8 9 10   ALKPHOS 52 51 50 50 54  BILITOT 0.5 0.8 0.7 0.8 0.7  PROT 5.2* 5.3* 5.5* 5.6* 5.8*  ALBUMIN 2.0* 2.0* 2.0* 2.0* 2.1*   No results for input(s): LIPASE, AMYLASE in the last 168 hours. No results for input(s): AMMONIA in the last 168 hours. Coagulation Profile: No results for  input(s): INR, PROTIME in the last 168 hours. Cardiac Enzymes: No results for input(s): CKTOTAL, CKMB, CKMBINDEX, TROPONINI in the last 168 hours. BNP (last 3 results) No results for input(s): PROBNP in the last 8760 hours. HbA1C: No results for input(s): HGBA1C in the last 72 hours. CBG: Recent Labs  Lab 09/19/20 0814 09/23/20 0613  GLUCAP 87 108*   Lipid Profile: No results for input(s): CHOL, HDL, LDLCALC, TRIG, CHOLHDL, LDLDIRECT in the last 72 hours. Thyroid Function Tests: No results for input(s): TSH, T4TOTAL, FREET4, T3FREE, THYROIDAB in the last 72 hours. Anemia Panel: Recent Labs    09/22/20 0111  FERRITIN 894*   Sepsis Labs: No results for input(s): PROCALCITON, LATICACIDVEN in the last 168 hours.  Recent Results (from the past 240  hour(s))  Culture, blood (Routine x 2)     Status: None   Collection Time: 09/14/20  3:08 PM   Specimen: BLOOD  Result Value Ref Range Status   Specimen Description BLOOD SITE NOT SPECIFIED  Final   Special Requests   Final    BOTTLES DRAWN AEROBIC AND ANAEROBIC Blood Culture results may not be optimal due to an inadequate volume of blood received in culture bottles   Culture   Final    NO GROWTH 5 DAYS Performed at [facility], [place] [address] [place], [place] [address]    Report Status 09/19/2020 FINAL  Final  Resp Panel by RT-PCR (Flu A&B, Covid) Nasopharyngeal Swab     Status: Abnormal   Collection Time: 09/14/20  3:12 PM   Specimen: Nasopharyngeal Swab; Nasopharyngeal(NP) swabs in vial transport medium  Result Value Ref Range Status   SARS Coronavirus 2 by RT PCR POSITIVE (A) NEGATIVE Final    Comment: RESULT CALLED TO, READ BACK BY AND VERIFIED WITH: K PATE RN 1630 09/14/20 A BROWNING (NOTE) SARS-CoV-2 target nucleic acids are DETECTED.  The SARS-CoV-2 RNA is generally detectable in upper respiratory specimens during the acute phase of infection. Positive results are indicative of the presence of the identified  virus, but do not rule out bacterial infection or co-infection with other pathogens not detected by the test. Clinical correlation with patient history and other diagnostic information is necessary to determine patient infection status. The expected result is Negative.  Fact Sheet for Patients: EntrepreneurPulse.com.au  Fact Sheet for Healthcare Providers: IncredibleEmployment.be  This test is not yet approved or cleared by the [place] FDA and  has been authorized for detection and/or diagnosis of SARS-CoV-2 by FDA under an Emergency Use Authorization (EUA).  This EUA will remain in effect (meaning this test can be  used) for the duration of  the COVID-19 declaration under Section 564(b)(1) of the Act, 21 U.S.C. section 360bbb-3(b)(1), unless the authorization is terminated or revoked sooner.     Influenza A by PCR NEGATIVE NEGATIVE Final   Influenza B by PCR NEGATIVE NEGATIVE Final    Comment: (NOTE) The Xpert Xpress SARS-CoV-2/FLU/RSV plus assay is intended as an aid in the diagnosis of influenza from Nasopharyngeal swab specimens and should not be used as a sole basis for treatment. Nasal washings and aspirates are unacceptable for Xpert Xpress SARS-CoV-2/FLU/RSV testing.  Fact Sheet for Patients: EntrepreneurPulse.com.au  Fact Sheet for Healthcare Providers: IncredibleEmployment.be  This test is not yet approved or cleared by the [place] FDA and has been authorized for detection and/or diagnosis of SARS-CoV-2 by FDA under an Emergency Use Authorization (EUA). This EUA will remain in effect (meaning this test can be used) for the duration of the COVID-19 declaration under Section 564(b)(1) of the Act, 21 U.S.C. section 360bbb-3(b)(1), unless the authorization is terminated or revoked.  Performed at [facility], [place] [address] [place], [place] [address]   Culture, blood (Routine  x 2)     Status: None   Collection Time: 09/14/20  7:31 PM   Specimen: BLOOD LEFT HAND  Result Value Ref Range Status   Specimen Description BLOOD LEFT HAND  Final   Special Requests   Final    BOTTLES DRAWN AEROBIC ONLY Blood Culture results may not be optimal due to an inadequate volume of blood received in culture bottles   Culture   Final    NO GROWTH 5 DAYS Performed at [facility], [place]  [address], [place], [place] [address]    Report Status 09/19/2020 FINAL  Final  Urine culture     Status: None   Collection Time: 09/14/20 10:38 PM   Specimen: In/Out Cath Urine  Result Value Ref Range Status   Specimen Description IN/OUT CATH URINE  Final   Special Requests NONE  Final   Culture   Final    NO GROWTH Performed at [facility], [place] [address] [place], [place] [address]    Report Status 09/16/2020 FINAL  Final      Radiology Studies: No results found.   Scheduled Meds: . clopidogrel  300 mg Oral Once   Followed by  . clopidogrel  75 mg Oral Q breakfast  . docusate sodium  100 mg Oral BID  . enoxaparin (LOVENOX) injection  40 mg Subcutaneous Q24H  . folic acid  1 mg Oral Daily  . multivitamin with minerals  1 tablet Oral Daily  . potassium chloride  40 mEq Oral Daily  . rosuvastatin  10 mg Oral Daily  . sodium chloride flush  3 mL Intravenous Q12H  . thiamine  100 mg Oral Daily   Or  . thiamine  100 mg Intravenous Daily   Continuous Infusions: . sodium chloride 100 mL/hr at 09/21/20 1314  . sodium chloride    . sodium chloride 75 mL/hr at 09/22/20 1353     LOS: 10 days   Time spent: 25 minutes spent in the coordination of care today.   Little Ishikawa, DO [facility]  If 7PM-7AM, please contact night-coverage [email or web address] 09/24/2020, 7:58 AM

## 2020-09-25 DIAGNOSIS — M869 Osteomyelitis, unspecified: Secondary | ICD-10-CM | POA: Diagnosis not present

## 2020-09-25 DIAGNOSIS — A419 Sepsis, unspecified organism: Secondary | ICD-10-CM | POA: Diagnosis not present

## 2020-09-25 DIAGNOSIS — U071 COVID-19: Secondary | ICD-10-CM | POA: Diagnosis not present

## 2020-09-25 LAB — CBC
HCT: 24.5 % — ABNORMAL LOW (ref 39.0–52.0)
Hemoglobin: 7.6 g/dL — ABNORMAL LOW (ref 13.0–17.0)
MCH: 28.5 pg (ref 26.0–34.0)
MCHC: 31 g/dL (ref 30.0–36.0)
MCV: 91.8 fL (ref 80.0–100.0)
Platelets: 481 10*3/uL — ABNORMAL HIGH (ref 150–400)
RBC: 2.67 MIL/uL — ABNORMAL LOW (ref 4.22–5.81)
RDW: 21.3 % — ABNORMAL HIGH (ref 11.5–15.5)
WBC: 10.4 10*3/uL (ref 4.0–10.5)
nRBC: 0 % (ref 0.0–0.2)

## 2020-09-25 LAB — BASIC METABOLIC PANEL
Anion gap: 7 (ref 5–15)
BUN: 9 mg/dL (ref 8–23)
CO2: 18 mmol/L — ABNORMAL LOW (ref 22–32)
Calcium: 8.9 mg/dL (ref 8.9–10.3)
Chloride: 114 mmol/L — ABNORMAL HIGH (ref 98–111)
Creatinine, Ser: 1.18 mg/dL (ref 0.61–1.24)
GFR, Estimated: >60 mL/min (ref >60)
Glucose, Bld: 94 mg/dL (ref 70–99)
Potassium: 4 mmol/L (ref 3.5–5.1)
Sodium: 139 mmol/L (ref 135–145)

## 2020-09-25 MED ORDER — METOPROLOL TARTRATE 25 MG PO TABS
25.0000 mg | ORAL_TABLET | Freq: Two times a day (BID) | ORAL | Status: DC
  Administered 2020-09-25 – 2020-09-27 (×5): 25 mg via ORAL
  Filled 2020-09-25 (×5): qty 1

## 2020-09-25 NOTE — Progress Notes (Signed)
[facility] PROGRESS NOTE    Progress Note  Seth [name]  [id number] DOB: 1955/12/07 DOA: 09/14/2020 PCP: Lucianne Lei, MD     Brief Narrative:   Seth [name] is an 65 y.o. male past medical history of alcohol abuse, essential hypertension and COPD was found to be confused was brought into the ED, was noted to have some left foot swelling and some ulceration in the distal phalanges, which he relates has been there for about a month.  The of the head was unremarkable he was found to be febrile with a temperature of 102 x-ray showed left foot ulcer with features concerning for osteomyelitis of the distal phalanges, blood cultures were ordered started empirically on antibiotics osteomyelitis and left lower extremity cellulitis.  Significant Events:   Significant studies: 09/15/2020 MRI of the left foot showed left foot osteomyelitis with ulceration and cellulitis 09/20/2020 arteriogram and angioplasty status post laser arthrectomy with balloon angioplasty and stent placement  Antibiotics: 09/14/2020 vancomycin 09/22/2020 09/14/2020 Flagyl  09/22/2020 09/14/2020 cefepime 09/22/2020  Microbiology data: Blood culture:  Procedures: None  Assessment/Plan:   Severe sepsis due to left foot osteomyelitis and cellulitis present on admission: Orthopedic surgery and vascular surgery was consulted he status post angiogram with arthrectomy balloon angioplasty and stenting, 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 He completed his antibiotic on 09/23/2020.  COVID-19 infection: Currently asymptomatic can come off precautions on 09/25/2020.  Alcohol abuse: No signs of withdrawal continue to monitor with CIWA protocol  Acute metabolic encephalopathy: Likely due to infectious etiology now resolved.  COPD: Stable noted.  Essential hypertension: Blood pressure remains acceptable currently on no antihypertensive medication.   DVT prophylaxis: lovenox Family  Communication: None Status is: Inpatient  Remains inpatient appropriate because:Hemodynamically unstable   Dispo: The patient is from: Home              Anticipated d/c is to: Home              Anticipated d/c date is: 1 day              Patient currently is medically stable to d/c.   Difficult to place patient No        Code Status:     Code Status Orders  (From admission, onward)         Start     Ordered   09/14/20 2216  Full code  Continuous        09/14/20 2218        Code Status History    Date Active Date Inactive Code Status Order ID Comments User Context   07/20/2020 0219 07/31/2020 2015 Full Code [id number]  Shela Leff, MD ED   05/31/2019 2008 06/04/2019 1731 Full Code [id number]  Etta Quill, DO ED   02/19/2019 1811 02/24/2019 1957 Full Code [id number]  Darliss Cheney, MD Inpatient   08/23/2017 2119 08/26/2017 1618 Full Code [id number]  Rise Patience, MD Inpatient   Advance Care Planning Activity        IV Access:    Peripheral IV   Procedures and diagnostic studies:   No results found.   Medical Consultants:    None.  Anti-Infectives:   None  Subjective:    Seth [name] no complains  Objective:    Vitals:   09/24/20 2044 09/25/20 0023 09/25/20 0309 09/25/20 0849  BP: 135/85 109/63 114/67 132/75  Pulse: (!) 110 98 96 (!) 108  Resp: 15 19 18 15   Temp:  97.8 F (36.6 C) 98.6 F (37 C) 98.6 F (37 C) 98.3 F (36.8 C)  TempSrc: Oral Oral Oral Oral  SpO2: 100% 100% 98% 100%  Weight:   66.5 kg   Height:       SpO2: 100 % O2 Flow Rate (L/min): 4 L/min   Intake/Output Summary (Last 24 hours) at 09/25/2020 0858 Last data filed at 09/25/2020 0418 Gross per 24 hour  Intake 240 ml  Output 525 ml  Net -285 ml   Filed Weights   09/23/20 0631 09/24/20 0337 09/25/20 0309  Weight: 69.6 kg 66.4 kg 66.5 kg    Exam: General exam: In no acute distress. Respiratory system: Good air movement and clear to  auscultation. Cardiovascular system: S1 & S2 heard, RRR. No JVD. Gastrointestinal system: Abdomen is nondistended, soft and nontender.  Extremities: No pedal edema. Skin: No rashes, lesions or ulcers Psychiatry: Judgement and insight appear normal. Mood & affect appropriate.   Data Reviewed:    Labs: Basic Metabolic Panel: Recent Labs  Lab 09/19/20 1725 09/20/20 0201 09/21/20 0238 09/22/20 0111 09/23/20 0239 09/24/20 0247 09/25/20 0118  NA  --    < > 132* 135 135 138 139  K  --    < > 4.2 4.1 4.5 4.1 4.0  CL  --    < > 107 109 112* 112* 114*  CO2  --    < > 17* 17* 16* 17* 18*  GLUCOSE  --    < > 113* 91 121* 82 94  BUN  --    < > 6* 5* 12 8 9   CREATININE  --    < > 0.91 0.92 1.21 1.11 1.18  CALCIUM  --    < > 8.8* 8.7* 8.7* 9.2 8.9  MG 1.6*  --  1.9 1.8  --   --   --    < > = values in this interval not displayed.   GFR Estimated Creatinine Clearance: 60.3 mL/min (by C-G formula based on SCr of 1.18 mg/dL). Liver Function Tests: Recent Labs  Lab 09/19/20 0201 09/20/20 0201 09/21/20 0238 09/22/20 0111  AST 12* 12* 15 11*  ALT 9 8 9 10   ALKPHOS 51 50 50 54  BILITOT 0.8 0.7 0.8 0.7  PROT 5.3* 5.5* 5.6* 5.8*  ALBUMIN 2.0* 2.0* 2.0* 2.1*   No results for input(s): LIPASE, AMYLASE in the last 168 hours. No results for input(s): AMMONIA in the last 168 hours. Coagulation profile No results for input(s): INR, PROTIME in the last 168 hours. COVID-19 Labs  No results for input(s): DDIMER, FERRITIN, LDH, CRP in the last 72 hours.  Lab Results  Component Value Date   SARSCOV2NAA POSITIVE (A) 09/14/2020   [place] NEGATIVE 07/29/2020   [place] NEGATIVE 07/19/2020   [place] NEGATIVE 05/31/2019    CBC: Recent Labs  Lab 09/19/20 0201 09/20/20 0201 09/21/20 0238 09/22/20 0111 09/23/20 0239 09/24/20 0247 09/25/20 0118  WBC 8.6 8.8 9.0 9.4 15.0* 11.0* 10.4  NEUTROABS 5.2 5.2 6.1 6.0  --   --   --   HGB 7.6* 7.7* 7.8* 8.2* 7.8* 7.7* 7.6*  HCT 24.7*  23.7* 24.8* 26.6* 23.7* 23.8* 24.5*  MCV 92.9 92.6 91.9 92.4 91.2 90.2 91.8  PLT 442* 490* 464* 512* 535* 501* 481*   Cardiac Enzymes: No results for input(s): CKTOTAL, CKMB, CKMBINDEX, TROPONINI in the last 168 hours. BNP (last 3 results) No results for input(s): PROBNP in the last 8760 hours. CBG: Recent Labs  Lab 09/19/20 [phone]  09/23/20 0613  GLUCAP 87 108*   D-Dimer: No results for input(s): DDIMER in the last 72 hours. Hgb A1c: No results for input(s): HGBA1C in the last 72 hours. Lipid Profile: No results for input(s): CHOL, HDL, LDLCALC, TRIG, CHOLHDL, LDLDIRECT in the last 72 hours. Thyroid function studies: No results for input(s): TSH, T4TOTAL, T3FREE, THYROIDAB in the last 72 hours.  Invalid input(s): FREET3 Anemia work up: No results for input(s): VITAMINB12, FOLATE, FERRITIN, TIBC, IRON, RETICCTPCT in the last 72 hours. Sepsis Labs: Recent Labs  Lab 09/22/20 0111 09/23/20 0239 09/24/20 0247 09/25/20 0118  WBC 9.4 15.0* 11.0* 10.4   Microbiology No results found for this or any previous visit (from the past 240 hour(s)).   Medications:   . clopidogrel  300 mg Oral Once   Followed by  . clopidogrel  75 mg Oral Q breakfast  . docusate sodium  100 mg Oral BID  . enoxaparin (LOVENOX) injection  40 mg Subcutaneous Q24H  . folic acid  1 mg Oral Daily  . metoprolol tartrate  12.5 mg Oral BID  . multivitamin with minerals  1 tablet Oral Daily  . potassium chloride  40 mEq Oral Daily  . rosuvastatin  10 mg Oral Daily  . sodium chloride flush  3 mL Intravenous Q12H  . thiamine  100 mg Oral Daily   Or  . thiamine  100 mg Intravenous Daily   Continuous Infusions: . sodium chloride 75 mL/hr at 09/22/20 1353      LOS: 11 days   Charlynne Cousins  [facility]  09/25/2020, 8:58 AM

## 2020-09-25 NOTE — Progress Notes (Signed)
Pt's airborne and contact precautions discontinued per protocol and doctors note stating he can "come off" after completing 10 days of precautions/quarantine.

## 2020-09-25 NOTE — Progress Notes (Signed)
Mobility Specialist: Progress Note   09/25/20 1602  Mobility  Activity Ambulated in hall  Level of Assistance Contact guard assist, steadying assist  Assistive Device Front wheel [name]  Distance Ambulated (ft) 250 ft  Mobility Response Tolerated well  Mobility performed by Mobility specialist  $Mobility charge 1 Mobility   During Mobility: 124 HR Post-Mobility: 102 HR, 100% SpO2  Pt c/o feeling SOB after returning to room. Pt asx during ambulation and is motivated to continue walking with staff.   [facility] Seth [name] Mobility Specialist Mobility Specialist Phone: [phone]

## 2020-09-26 DIAGNOSIS — U071 COVID-19: Secondary | ICD-10-CM | POA: Diagnosis not present

## 2020-09-26 DIAGNOSIS — M869 Osteomyelitis, unspecified: Secondary | ICD-10-CM | POA: Diagnosis not present

## 2020-09-26 DIAGNOSIS — A419 Sepsis, unspecified organism: Secondary | ICD-10-CM | POA: Diagnosis not present

## 2020-09-26 LAB — CBC
HCT: 27 % — ABNORMAL LOW (ref 39.0–52.0)
Hemoglobin: 8.5 g/dL — ABNORMAL LOW (ref 13.0–17.0)
MCH: 29 pg (ref 26.0–34.0)
MCHC: 31.5 g/dL (ref 30.0–36.0)
MCV: 92.2 fL (ref 80.0–100.0)
Platelets: 527 10*3/uL — ABNORMAL HIGH (ref 150–400)
RBC: 2.93 MIL/uL — ABNORMAL LOW (ref 4.22–5.81)
RDW: 21.6 % — ABNORMAL HIGH (ref 11.5–15.5)
WBC: 8 10*3/uL (ref 4.0–10.5)
nRBC: 0 % (ref 0.0–0.2)

## 2020-09-26 LAB — BASIC METABOLIC PANEL
Anion gap: 8 (ref 5–15)
BUN: 8 mg/dL (ref 8–23)
CO2: 18 mmol/L — ABNORMAL LOW (ref 22–32)
Calcium: 9 mg/dL (ref 8.9–10.3)
Chloride: 114 mmol/L — ABNORMAL HIGH (ref 98–111)
Creatinine, Ser: 1.08 mg/dL (ref 0.61–1.24)
GFR, Estimated: >60 mL/min (ref >60)
Glucose, Bld: 110 mg/dL — ABNORMAL HIGH (ref 70–99)
Potassium: 4.7 mmol/L (ref 3.5–5.1)
Sodium: 140 mmol/L (ref 135–145)

## 2020-09-26 NOTE — Progress Notes (Signed)
Mobility Specialist: Progress Note   09/26/20 1556  Mobility  Activity Ambulated in hall  Level of Assistance Standby assist, set-up cues, supervision of patient - no hands on  Assistive Device Front wheel [place]  Distance Ambulated (ft) 250 ft  Mobility Response Tolerated well  Mobility performed by Mobility specialist  $Mobility charge 1 Mobility   Pre-Mobility: 100 HR, 128/79 BP, 100% SpO2 During Mobility: 124 HR Post-Mobility: 89 HR, 141/93 BP, 100% SpO2  Pt to BSC before walk and needing to return to room after walk to use BSC again. Pt to bed per request after session. Pt motivated to continue walking with staff.   [facility] [place] Mobility Specialist Mobility Specialist Phone: [phone]

## 2020-09-26 NOTE — TOC Progression Note (Signed)
Transition of Care [facility]) - Progression Note    Patient Details  Name: Seth [name] MRN: [id number] Date of Birth: Feb 26, 1956  Transition of Care [facility]) CM/SW Contact  Graves-Bigelow, Ocie Cornfield, RN Phone Number: 09/26/2020, 2:45 PM  Clinical Narrative:  Case Manager spoke with patient regarding [place]. Patient wants to return home with home health services. Patient states that his daughter and sister will provide 24/7 supervision. Patient has durable medical equipment rolling [place] in the home. Patient did not have a preference for home health services- Case Manager called [facility] and the agency will service the patient. Start of care to begin within 24-48 hours post transition home.   Expected Discharge Plan: [place] Barriers to Discharge: Continued Medical Work up  Expected Discharge Plan and Services Expected Discharge Plan: [place] In-house Referral: NA Discharge Planning Services: CM Consult Post Acute Care Choice: [place] arrangements for the past 2 months: [place]: PT [place]: [place] ([place]) Date [place]: 09/26/20 Time [place]: 1443 Representative spoke with at [place]: Corene Cornea    Readmission Risk Interventions Readmission Risk Prevention Plan 06/04/2019  Transportation Screening Complete  PCP or Specialist Appt within 5-7 Days Complete  Home Care Screening Complete  Medication Review (RN CM) Complete  Some recent data might be hidden

## 2020-09-26 NOTE — Progress Notes (Signed)
[facility] PROGRESS NOTE    Progress Note  Seth [name]  TIR:[id number] DOB: Jan 20, 1956 DOA: 09/14/2020 PCP: Lucianne Lei, MD     Brief Narrative:   Seth [name] is an 65 y.o. male past medical history of alcohol abuse, essential hypertension and COPD was found to be confused was brought into the ED, was noted to have some left foot swelling and some ulceration in the distal phalanges, which he relates has been there for about a month.  The of the head was unremarkable he was found to be febrile with a temperature of 102 x-ray showed left foot ulcer with features concerning for osteomyelitis of the distal phalanges, blood cultures were ordered started empirically on antibiotics osteomyelitis and left lower extremity cellulitis.  Significant Events:   Significant studies: 09/15/2020 MRI of the left foot showed left foot osteomyelitis with ulceration and cellulitis 09/20/2020 arteriogram and angioplasty status post laser arthrectomy with balloon angioplasty and stent placement  Antibiotics: 09/14/2020 vancomycin 09/22/2020 09/14/2020 Flagyl  09/22/2020 09/14/2020 cefepime 09/22/2020  Microbiology data: Blood culture:  Procedures: None  Assessment/Plan:   Severe sepsis due to left foot osteomyelitis and cellulitis present on admission: Orthopedic surgery and vascular surgery was consulted he status post angiogram with arthrectomy balloon angioplasty and stenting, 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 He completed his antibiotic on 09/23/2020. Physical therapy evaluated the patient and recommended skilled nursing facility. Patient is now agreeable to skilled nursing facility placement. Will contact TOC.  COVID-19 infection: Currently asymptomatic can come off precautions on 09/25/2020.  Alcohol abuse: No signs of withdrawal continue to monitor with CIWA protocol  Acute metabolic encephalopathy: Likely due to infectious etiology now  resolved.  COPD: Stable noted.  Essential hypertension: Blood pressure remains acceptable currently on no antihypertensive medication.   DVT prophylaxis: lovenox Family Communication: None Status is: Inpatient  Remains inpatient appropriate because:Hemodynamically unstable   Dispo: The patient is from: Home              Anticipated d/c is to: SNF              Anticipated d/c date is: 1 day              Patient currently is medically stable to d/c.   Difficult to place patient No  Code Status:     Code Status Orders  (From admission, onward)         Start     Ordered   09/14/20 2216  Full code  Continuous        09/14/20 2218        Code Status History    Date Active Date Inactive Code Status Order ID Comments User Context   07/20/2020 0219 07/31/2020 2015 Full Code [id number]  Shela Leff, MD ED   05/31/2019 2008 06/04/2019 1731 Full Code [id number]  Etta Quill, DO ED   02/19/2019 1811 02/24/2019 1957 Full Code [id number]  Darliss Cheney, MD Inpatient   08/23/2017 2119 08/26/2017 1618 Full Code [id number]  Rise Patience, MD Inpatient   Advance Care Planning Activity        IV Access:    Peripheral IV   Procedures and diagnostic studies:   No results found.   Medical Consultants:    None.  Anti-Infectives:   None  Subjective:    Seth [name] no complains  Objective:    Vitals:   09/25/20 1700 09/25/20 1950 09/25/20 2338 09/26/20 0239  BP: 119/77 140/79 130/80 125/88  Pulse: 95 (!) 109 89 94  Resp: 20 15 18 14   Temp: 98.6 F (37 C) 98.6 F (37 C) 98.7 F (37.1 C) 97.9 F (36.6 C)  TempSrc: Oral Oral Oral Oral  SpO2: 100% 100% 100% 100%  Weight:    68 kg  Height:       SpO2: 100 % O2 Flow Rate (L/min): 4 L/min   Intake/Output Summary (Last 24 hours) at 09/26/2020 0847 Last data filed at 09/26/2020 0242 Gross per 24 hour  Intake 120 ml  Output 550 ml  Net -430 ml   Filed Weights   09/24/20 0337 09/25/20 0309  09/26/20 0239  Weight: 66.4 kg 66.5 kg 68 kg    Exam: General exam: In no acute distress. Respiratory system: Good air movement and clear to auscultation. Cardiovascular system: S1 & S2 heard, RRR. No JVD. Gastrointestinal system: Abdomen is nondistended, soft and nontender.  Extremities: No pedal edema. Skin: No rashes, lesions or ulcers Psychiatry: Judgement and insight appear normal. Mood & affect appropriate.  Data Reviewed:    Labs: Basic Metabolic Panel: Recent Labs  Lab 09/19/20 1725 09/20/20 0201 09/21/20 0238 09/22/20 0111 09/23/20 0239 09/24/20 0247 09/25/20 0118 09/26/20 0318  NA  --    < > 132* 135 135 138 139 140  K  --    < > 4.2 4.1 4.5 4.1 4.0 4.7  CL  --    < > 107 109 112* 112* 114* 114*  CO2  --    < > 17* 17* 16* 17* 18* 18*  GLUCOSE  --    < > 113* 91 121* 82 94 110*  BUN  --    < > 6* 5* 12 8 9 8   CREATININE  --    < > 0.91 0.92 1.21 1.11 1.18 1.08  CALCIUM  --    < > 8.8* 8.7* 8.7* 9.2 8.9 9.0  MG 1.6*  --  1.9 1.8  --   --   --   --    < > = values in this interval not displayed.   GFR Estimated Creatinine Clearance: 67.3 mL/min (by C-G formula based on SCr of 1.08 mg/dL). Liver Function Tests: Recent Labs  Lab 09/20/20 0201 09/21/20 0238 09/22/20 0111  AST 12* 15 11*  ALT 8 9 10   ALKPHOS 50 50 54  BILITOT 0.7 0.8 0.7  PROT 5.5* 5.6* 5.8*  ALBUMIN 2.0* 2.0* 2.1*   No results for input(s): LIPASE, AMYLASE in the last 168 hours. No results for input(s): AMMONIA in the last 168 hours. Coagulation profile No results for input(s): INR, PROTIME in the last 168 hours. COVID-19 Labs  No results for input(s): DDIMER, FERRITIN, LDH, CRP in the last 72 hours.  Lab Results  Component Value Date   SARSCOV2NAA POSITIVE (A) 09/14/2020   [place] NEGATIVE 07/29/2020   [place] NEGATIVE 07/19/2020   [place] NEGATIVE 05/31/2019    CBC: Recent Labs  Lab 09/20/20 0201 09/21/20 0238 09/22/20 0111 09/23/20 0239 09/24/20 0247  09/25/20 0118 09/26/20 0318  WBC 8.8 9.0 9.4 15.0* 11.0* 10.4 8.0  NEUTROABS 5.2 6.1 6.0  --   --   --   --   HGB 7.7* 7.8* 8.2* 7.8* 7.7* 7.6* 8.5*  HCT 23.7* 24.8* 26.6* 23.7* 23.8* 24.5* 27.0*  MCV 92.6 91.9 92.4 91.2 90.2 91.8 92.2  PLT 490* 464* 512* 535* 501* 481* 527*   Cardiac Enzymes: No results for input(s): CKTOTAL, CKMB, CKMBINDEX, TROPONINI in the last 168 hours. BNP (  last 3 results) No results for input(s): PROBNP in the last 8760 hours. CBG: Recent Labs  Lab 09/23/20 0613  GLUCAP 108*   D-Dimer: No results for input(s): DDIMER in the last 72 hours. Hgb A1c: No results for input(s): HGBA1C in the last 72 hours. Lipid Profile: No results for input(s): CHOL, HDL, LDLCALC, TRIG, CHOLHDL, LDLDIRECT in the last 72 hours. Thyroid function studies: No results for input(s): TSH, T4TOTAL, T3FREE, THYROIDAB in the last 72 hours.  Invalid input(s): FREET3 Anemia work up: No results for input(s): VITAMINB12, FOLATE, FERRITIN, TIBC, IRON, RETICCTPCT in the last 72 hours. Sepsis Labs: Recent Labs  Lab 09/23/20 0239 09/24/20 0247 09/25/20 0118 09/26/20 0318  WBC 15.0* 11.0* 10.4 8.0   Microbiology No results found for this or any previous visit (from the past 240 hour(s)).   Medications:   . clopidogrel  300 mg Oral Once   Followed by  . clopidogrel  75 mg Oral Q breakfast  . docusate sodium  100 mg Oral BID  . enoxaparin (LOVENOX) injection  40 mg Subcutaneous Q24H  . folic acid  1 mg Oral Daily  . metoprolol tartrate  25 mg Oral BID  . multivitamin with minerals  1 tablet Oral Daily  . potassium chloride  40 mEq Oral Daily  . rosuvastatin  10 mg Oral Daily  . sodium chloride flush  3 mL Intravenous Q12H  . thiamine  100 mg Oral Daily   Or  . thiamine  100 mg Intravenous Daily   Continuous Infusions: . sodium chloride 75 mL/hr at 09/22/20 1353      LOS: 12 days   Charlynne Cousins  [facility]  09/26/2020, 8:47 AM

## 2020-09-27 DIAGNOSIS — U071 COVID-19: Secondary | ICD-10-CM | POA: Diagnosis not present

## 2020-09-27 DIAGNOSIS — M869 Osteomyelitis, unspecified: Secondary | ICD-10-CM | POA: Diagnosis not present

## 2020-09-27 DIAGNOSIS — M86072 Acute hematogenous osteomyelitis, left ankle and foot: Secondary | ICD-10-CM | POA: Diagnosis not present

## 2020-09-27 DIAGNOSIS — A419 Sepsis, unspecified organism: Secondary | ICD-10-CM | POA: Diagnosis not present

## 2020-09-27 MED ORDER — ROSUVASTATIN CALCIUM 10 MG PO TABS: 10.0000 mg | ORAL_TABLET | Freq: Every day | ORAL | Status: DC

## 2020-09-27 MED ORDER — CLOPIDOGREL BISULFATE 75 MG PO TABS: 75.0000 mg | ORAL_TABLET | Freq: Every day | ORAL | 3 refills | Status: DC

## 2020-09-27 NOTE — Progress Notes (Addendum)
Physical Therapy Treatment Patient Details Name: Seth [name] MRN: [id number] DOB: 15-Jun-1956 Today's Date: 09/27/2020    History of Present Illness Seth [name] is a 65 y.o. male with history of alcohol abuse, hypertension and COPD was found to be confused while patient was shopping and was brought to the ER.  At the time of my exam patient has become more alert awake.  He states he has noticed that his left foot swelling with some ulceration on the distal phalanx for the last 1 month denies any trauma or insect bites. Admitted for acute encephalopathy and sepsis from osteomyelitis of L foot. 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 Patient tested positive for COVID after arrival to ED.    PT Comments    Pt received in supine, agreeable to therapy session and with good participation and tolerance for mobility. Pt able to perform gait trial to 18ft while maintaining NWB on LLE, pt at times attempting TWB when taking standing breaks due to fatigue and with transfers so he was given extensive instruction on importance of complying with NWB for proper healing. Pt given verbal/visual demo for LLE HEP to continue for strengthening. Pt min guard for transfers and gait and modI for bed mobility. Pt continues to benefit from PT services to progress toward functional mobility goals. D/C recs below remain appropriate due to pt poor standing balance with NWB status, if pt instead chooses to discharge home recommend STRICT 24/7 Supervision/assist and HHPT due to pt poor standing balance and decreased activity tolerance. Frequency and DME recs updated per discussion with Supervising PT and care team if pt instead chooses to discharge home.  Follow Up Recommendations  SNF;Supervision/Assistance - 24 hour     Equipment Recommendations  3in1 (PT);Wheelchair (measurements PT);Wheelchair cushion (measurements PT) (WC due to difficulty maintaining NWB LLE)    Recommendations for Other Services        Precautions / Restrictions Precautions Precautions: Fall Precaution Comments: wound vac Required Braces or Orthoses: Other Brace Other Brace: L post op shoe Restrictions Weight Bearing Restrictions: Yes LLE Weight Bearing: Non weight bearing Other Position/Activity Restrictions: NWB LLE per order and op note    Mobility  Bed Mobility Overal bed mobility: Modified Independent Bed Mobility: Supine to Sit;Sit to Supine     Supine to sit: Modified independent (Device/Increase time) Sit to supine: Modified independent (Device/Increase time)   General bed mobility comments: increased time to perform, HOB elevated  Transfers Overall transfer level: Needs assistance Equipment used: Rolling Pardue (2 wheeled) Transfers: Sit to/from Stand Sit to Stand: Min guard         General transfer comment: pt at times using TWB for balance when rising, given cues for NWB and technique to maintain pre/post transfer; decreased stability with stand to sit  Ambulation/Gait Ambulation/Gait assistance: Min guard Gait Distance (Feet): 60 Feet Assistive device: Rolling B[place] (2 wheeled) Gait Pattern/deviations:  (hop-to pattern) Gait velocity: grossly <0.3 m/s   General Gait Details: needs cues intermittenly for compliance to NWB as he attempts TWB when fatigued/taking standing breaks; needed ~10 brief standing breaks throughout distance 2/2 fatigue   Stairs             Wheelchair Mobility    Modified Rankin (Stroke Patients Only)       Balance Overall balance assessment: Needs assistance Sitting-balance support: No upper extremity supported;Feet supported Sitting balance-[name] Scale: Good     Standing balance support: Bilateral upper extremity supported;During functional activity Standing balance-[name] Scale: Poor Standing balance  comment: reliant on UE support due to NWB LLE                            Cognition Arousal/Alertness: Awake/alert Behavior During  Therapy: WFL for tasks assessed/performed Overall Cognitive Status: No family/caregiver present to determine baseline cognitive functioning                                 General Comments: some STM deficits noted, needs reinforcement on safe technique each transfer attempt      Exercises Other Exercises Other Exercises: verbal review for LLE supine HEP and pt given strengthening handout for exercises TID    General Comments General comments (skin integrity, edema, etc.): HR to 141 bpm after gait trial (poor pleth reading during mobility), SpO2 WNL; increased WOB with mobility, needed standing breaks to recover      Pertinent Vitals/Pain Pain Assessment: Faces Faces Pain Scale: Hurts a little bit Pain Location: L foot Pain Intervention(s): Monitored during session;Repositioned (elevated on pillow at end of session)    Home Living                      Prior Function            PT Goals (current goals can now be found in the care plan section) Acute Rehab PT Goals Patient Stated Goal: be more active, to go home PT Goal Formulation: With patient Time For Goal Achievement: 10/06/20 Potential to Achieve Goals: Fair Progress towards PT goals: Progressing toward goals    Frequency    Min 2X/week      PT Plan Frequency needs to be updated;Equipment recommendations need to be updated    Co-evaluation              AM-PAC PT "6 Clicks" Mobility   Outcome Measure  Help needed turning from your back to your side while in a flat bed without using bedrails?: None Help needed moving from lying on your back to sitting on the side of a flat bed without using bedrails?: None Help needed moving to and from a bed to a chair (including a wheelchair)?: A Little Help needed standing up from a chair using your arms (e.g., wheelchair or bedside chair)?: A Little Help needed to walk in hospital room?: A Little Help needed climbing 3-5 steps with a railing? : A  Lot 6 Click Score: 19    End of Session Equipment Utilized During Treatment: Gait belt Activity Tolerance: Patient tolerated treatment well Patient left: in bed;with call bell/phone within reach Nurse Communication: Mobility status PT Visit Diagnosis: Unsteadiness on feet (R26.81);Muscle weakness (generalized) (M62.81);Other abnormalities of gait and mobility (R26.89)     Time: 2725-3664 PT Time Calculation (min) (ACUTE ONLY): 21 min  Charges:  $Gait Training: 8-22 mins                     Seth [name] P., PTA Acute Rehabilitation Services Pager: [phone] Office: [place] 09/27/2020, 11:48 AM

## 2020-09-27 NOTE — TOC Transition Note (Signed)
Transition of Care [facility]) - CM/SW Discharge Note   Patient Details  Name: Seth [name] MRN: [id number] Date of Birth: 29-Sep-1955  Transition of Care [facility]) CM/SW Contact:  Bethena Roys, RN Phone Number: 09/27/2020, 2:11 PM   Clinical Narrative: Case Manager spoke with patient regarding plan of care. Patient had declined to go to SNF over the weekend and he did not want to return to the [facility] in [place]. Case Manager asked to  reach out to daughter regarding plan of care and received verbal permission to do so. Daughter states that she checks in on the patient and stated that she wants the best for her father- daughter thinks he needs rehab; but she states her father will probably choose home. Case Manager asked daughter to call dad and discuss plan of care. Daughter called the Case Manager back and states the plan will be home with [employer] with durable medical equipment (DME). Daughter states between her aunt and herself that they can provide 24/7 supervision to the patient. Patient mentioned that he can get personal care services via [facility] if needed.   [facility]/aide and [employer] for DME wheelchair and bedside commode- to be delivered to the room prior to transition home. Staff RN to call Case Manager when DME arrives, then the Case Manager willcall the daughter for transport home. Daughter states she will provide transportation home via private vehicle.   Final next level of care: Home w Home Health Services Barriers to Discharge: No Barriers Identified   Patient Goals and CMS Choice Patient states their goals for this hospitalization and ongoing recovery are:: to return home   Choice offered to / list presented to :  (Patient did not have a choice)   Discharge Plan and Services In-house Referral: NA Discharge Planning Services: CM Consult Post Acute Care Choice: Home Health           DME Arranged: Lightweight manual wheelchair with seat cushion,Bedside commode   Date DME Agency Contacted: 09/27/20 Time DME Agency Contacted: [id number] Representative spoke with at DME Agency: Freda Munro HH Arranged: Nurse's Aide [place]: [place] ([place]) Date [place]: 09/26/20 Time [place]: [place] Representative spoke with at [place]: Corene Cornea     Readmission Risk Interventions Readmission Risk Prevention Plan 06/04/2019  Transportation Screening Complete  PCP or Specialist Appt within 5-7 Days Complete  Home Care Screening Complete  Medication Review (RN CM) Complete  Some recent data might be hidden

## 2020-09-27 NOTE — Care Management (Cosign Needed)
    Durable Medical Equipment  (From admission, onward)         Start     Ordered   09/27/20 1324  For home use only DME lightweight manual wheelchair with seat cushion  Once       Comments: Patient suffers from COPD and s/p left transmetatarsal amputation which impairs their ability to perform daily activities like walking in the home.  A rolling [place]chutz will not resolve the issue with performing activities of daily living. A wheelchair will allow patient to safely perform daily activities. Patient is not able to propel themselves in the home using a standard weight wheelchair due to general weakness. Patient can self propel in the lightweight wheelchair. Length of need lifetime Accessories: elevating leg rests (ELRs), wheel locks, extensions and anti-tippers.   09/27/20 1327   09/27/20 1243  For home use only DME 3 n 1  Once        09/27/20 1242   09/27/20 1243  For home use only DME wheelchair cushion (seat and back)  Once        09/27/20 1242

## 2020-09-27 NOTE — Progress Notes (Addendum)
Occupational Therapy Treatment Patient Details Name: Seth [name] MRN: [id number] DOB: 01/19/1956 Today's Date: 09/27/2020    History of present illness Seth [name] is a 65 y.o. male with history of alcohol abuse, hypertension and COPD was found to be confused while patient was shopping and was brought to the ER.  At the time of my exam patient has become more alert awake.  He states he has noticed that his left foot swelling with some ulceration on the distal phalanx for the last 1 month denies any trauma or insect bites. Admitted for acute encephalopathy and sepsis from osteomyelitis of L foot. 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 Patient tested positive for COVID after arrival to ED.   OT comments  Pt progressing with therapy for OOB ADL.  Pt maintaining NWB status on RLE for session after initial sit to stand; pt received education for energy conservation. Pt supervisionA for ADL and minguardA for mobility with ADL in standing; pt supervisionA for ADL tasks sitting at sink. Pt hopping well with RW and minguardA. Pt would benefit from continued OT skilled services. OT following acutely.    Follow Up Recommendations  Supervision/Assistance - 24 hour;SNF    Equipment Recommendations  3 in 1 bedside commode;Wheelchair (measurements OT);Wheelchair cushion (measurements OT)    Recommendations for Other Services      Precautions / Restrictions Precautions Precautions: Fall Precaution Comments: wound vac Required Braces or Orthoses: Other Brace Other Brace: L post op shoe Restrictions Weight Bearing Restrictions: Yes LLE Weight Bearing: Non weight bearing Other Position/Activity Restrictions: NWB LLE per order and op note       Mobility Bed Mobility Overal bed mobility: Modified Independent Bed Mobility: Supine to Sit;Sit to Supine     Supine to sit: Modified independent (Device/Increase time) Sit to supine: Modified independent (Device/Increase time)    General bed mobility comments: Increased time  Transfers Overall transfer level: Needs assistance Equipment used: Rolling Coad (2 wheeled) Transfers: Sit to/from Stand Sit to Stand: Min guard         General transfer comment: Pt sit to stand with TWB, but then assumes NWB by lifting LLE up with RW for stability    Balance Overall balance assessment: Needs assistance Sitting-balance support: No upper extremity supported;Feet supported Sitting balance-[name] Scale: Good     Standing balance support: Bilateral upper extremity supported;During functional activity Standing balance-[name] Scale: Poor Standing balance comment: NWB LLE                           ADL either performed or assessed with clinical judgement   ADL Overall ADL's : Needs assistance/impaired     Grooming: Supervision/safety;Set up;Sitting;Standing Grooming Details (indicate cue type and reason): Pt stood at sink, but unable to perform single UE tasks so pt remained seated for grooming tasks                 Toilet Transfer: BSC;Supervision/safety;Stand-pivot Toilet Transfer Details (indicate cue type and reason): use of RW and proper hand placement         Functional mobility during ADLs: Supervision/safety;Rolling [place];Cueing for safety General ADL Comments: Pt maintaining NWB status on RLE for session; pt education for energy conservation.     Vision   Additional Comments: wears glasses   Perception     Praxis      Cognition Arousal/Alertness: Awake/alert Behavior During Therapy: WFL for tasks assessed/performed Overall Cognitive Status: No family/caregiver present to determine baseline cognitive  functioning                                 General Comments: pt with fair safety awareness; following all commands        Exercises Exercises: Other exercises Other Exercises Other Exercises: verbal review for LLE supine HEP and pt given strengthening handout  for exercises TID   Shoulder Instructions       General Comments HR 140 BPM with exertion from bed to BA.    Pertinent Vitals/ Pain       Pain Assessment: Faces Faces Pain Scale: Hurts a little bit Pain Location: L foot Pain Intervention(s): Monitored during session  Home Living                                          Prior Functioning/Environment              Frequency  Min 2X/week        Progress Toward Goals  OT Goals(current goals can now be found in the care plan section)  Progress towards OT goals: Progressing toward goals  Acute Rehab OT Goals Patient Stated Goal: be more active, to go home OT Goal Formulation: With patient Time For Goal Achievement: 10/07/20 Potential to Achieve Goals: Good ADL Goals Pt Will Perform Lower Body Bathing: with modified independence;sit to/from stand Pt Will Perform Lower Body Dressing: with modified independence;sit to/from stand Pt Will Transfer to Toilet: with modified independence;ambulating Pt Will Perform Toileting - Clothing Manipulation and hygiene: with modified independence;sit to/from stand  Plan Frequency needs to be updated    Co-evaluation                 AM-PAC OT "6 Clicks" Daily Activity     Outcome Measure   Help from another person eating meals?: None Help from another person taking care of personal grooming?: None Help from another person toileting, which includes using toliet, bedpan, or urinal?: A Lot Help from another person bathing (including washing, rinsing, drying)?: A Lot Help from another person to put on and taking off regular upper body clothing?: None Help from another person to put on and taking off regular lower body clothing?: A Lot 6 Click Score: 18    End of Session Equipment Utilized During Treatment: Gait belt;Rolling [place]  OT Visit Diagnosis: Unsteadiness on feet (R26.81);Muscle weakness (generalized) (M62.81);Pain Pain - Right/Left: Left Pain -  part of body: Ankle and joints of foot   Activity Tolerance Other (comment);Patient tolerated treatment well (increased HR)   Patient Left in bed;with call bell/phone within reach   Nurse Communication Mobility status        Time: 9024-0973 OT Time Calculation (min): 17 min  Charges: OT General Charges $OT Visit: 1 Visit OT Treatments $Self Care/Home Management : 8-22 mins  Jefferey Pica, OTR/L Acute Rehabilitation Services Pager: [phone] Office: [phone]    Theron Cumbie C 09/27/2020, 1:30 PM

## 2020-09-27 NOTE — Progress Notes (Signed)
Mobility Specialist: Progress Note   09/27/20 1429  Mobility  Activity Ambulated in hall  Level of Assistance Minimal assist, patient does 75% or more  Assistive Device Front wheel N[place]  Distance Ambulated (ft) 60 ft  Mobility Response Tolerated well  Mobility performed by Mobility specialist  $Mobility charge 1 Mobility   Post-Mobility: 95 HR, 100% SpO2  Pt on BSC upon entering room. Pt assisted with pericare and agreeable to walk. Pt c/o feeling SOB during ambulation and stopped to take one short standing break. Pt back to bed after walk.  [facility] Hodari Chuba Mobility Specialist Mobility Specialist Phone: [phone]

## 2020-09-27 NOTE — Care Management Important Message (Signed)
Important Message  Patient Details  Name: Seth [name] MRN: [id number] Date of Birth: September 07, 1955   Medicare Important Message Given:  Yes     [name] 09/27/2020, 3:38 PM

## 2020-09-27 NOTE — Discharge Summary (Addendum)
Physician Discharge Summary  Seth [name]:[id number] DOB: [birth year] DOA: 09/14/2020  PCP: Lucianne Lei, MD  Admit date: 09/14/2020 Discharge date: 09/27/2020  Admitted From: Home  Disposition: Skilled nursing facility   Recommendations for Outpatient Follow-up:  1. Follow up with PCP in 1-2 weeks 2. Please obtain BMP/CBC in one week   Home Health: No Equipment/Devices: None  Discharge Condition: Stable CODE STATUS: Full Diet recommendation: Heart Healthy  Brief/Interim Summary: 65 y.o. male past medical history of alcohol abuse, essential hypertension and COPD was found to be confused was brought into the ED, was noted to have some left foot swelling and some ulceration in the distal phalanges, which he relates has been there for about a month.  The of the head was unremarkable he was found to be febrile with a temperature of 102 x-ray showed left foot ulcer with features concerning for osteomyelitis of the distal phalanges, blood cultures were ordered started empirically on antibiotics osteomyelitis and left lower extremity cellulitis  Significant studies: 09/15/2020 MRI of the left foot showed left foot osteomyelitis with ulceration and cellulitis 09/20/2020 arteriogram and angioplasty status post laser arthrectomy with balloon angioplasty and stent placement  Antibiotics: 09/14/2020 vancomycin 09/22/2020 09/14/2020 Flagyl        09/22/2020 09/14/2020 cefepime   09/22/2020  Microbiology data: Blood culture:  Procedures: None  Discharge Diagnoses:  Principal Problem:   Sepsis ([place]) Active Problems:   Chronic alcoholism ([place])   COVID-19 virus infection  Severe sepsis due to left foot osteomyelitis and cellulitis present on admission: Orthopedic and vascular surgery was consulted he status post angiogram with arthrectomy and balloon angioplasty with stenting, has currently wound VAC. Orthopedic surgery performed a left metatarsal amputation on 09/22/2020. He completed  his course of antibiotics in house on 09/23/2020. Physical therapy evaluated the patient and recommended skilled nursing facility.  COVID-19 infection: Probably incidentally he has remained asymptomatic off precautions on 09/25/2020.  Alcohol abuse: No signs of withdrawal.  Acute metabolic encephalopathy: Likely due to infectious etiology now resolved. . Essential hypertension: Currently on no antihypertensive medications we will continue to follow closely.  Discharge Instructions  Discharge Instructions    Diet - low sodium heart healthy   Complete by: As directed    Increase activity slowly   Complete by: As directed    Negative Pressure Wound Therapy - Incisional   Complete by: As directed    Show patient how to attach prevena vac. Should call office if alarms   No wound care   Complete by: As directed      Allergies as of 09/27/2020   No Known Allergies     Medication List    TAKE these medications   albuterol 108 (90 Base) MCG/ACT inhaler Commonly known as: VENTOLIN HFA Inhale 2 puffs into the lungs every 6 (six) hours as needed for wheezing or shortness of breath.   aspirin 81 MG chewable tablet Chew 1 tablet (81 mg total) by mouth daily.   clopidogrel 75 MG tablet Commonly known as: PLAVIX Take 1 tablet (75 mg total) by mouth daily with breakfast. Start taking on: September 28, 2020   cyanocobalamin 1000 MCG/ML injection Commonly known as: (VITAMIN B-12) Inject 1 mL (1,000 mcg total) into the muscle every 30 (thirty) days.   feeding supplement Liqd Take 237 mLs by mouth 3 (three) times daily between meals.   ferrous sulfate 325 (65 FE) MG tablet Take 1 tablet (325 mg total) by mouth 2 (two) times daily with a meal.  finasteride 5 MG tablet Commonly known as: PROSCAR Take 1 tablet (5 mg total) by mouth daily.   fluticasone furoate-vilanterol 200-25 MCG/INH Aepb Commonly known as: BREO ELLIPTA Inhale 1 puff into the lungs daily.   folic acid 1 MG  tablet Commonly known as: FOLVITE Take 1 tablet (1 mg total) by mouth daily.   Incruse Ellipta 62.5 MCG/INH Aepb Generic drug: umeclidinium bromide INHALE 1 PUFF INTO THE LUNGS DAILY   metoprolol tartrate 25 MG tablet Commonly known as: LOPRESSOR Take 0.5 tablets (12.5 mg total) by mouth 2 (two) times daily.   multivitamin with minerals Tabs tablet Take 1 tablet by mouth daily.   pantoprazole 40 MG tablet Commonly known as: PROTONIX Take 1 tablet (40 mg total) by mouth daily.   rosuvastatin 10 MG tablet Commonly known as: CRESTOR Take 1 tablet (10 mg total) by mouth daily. Start taking on: September 28, 2020   thiamine 100 MG tablet Commonly known as: Vitamin B-1 Take 1 tablet (100 mg total) by mouth daily.       Follow-up Information    Persons, [facility] [place], [place] In 1 week.   Specialty: Orthopedic Surgery Contact information: [place] [address] [address] [facility] up.   Specialty: [place] Why: Agency will accept the patient- if you need to contact the agency please call [phone]. Physical Therapy-             No Known Allergies  Consultations:  Vascular surgery  Orthopedic surgery   Procedures/Studies: CT Head Wo Contrast  Result Date: 09/14/2020 CLINICAL DATA:  Delirium. EXAM: CT HEAD WITHOUT CONTRAST TECHNIQUE: Contiguous axial images were obtained from the base of the skull through the vertex without intravenous contrast. COMPARISON:  Prior head CT examinations 07/19/2020 and earlier. FINDINGS: Brain: Mildly motion degraded exam. Mild cerebral and cerebellar atrophy. 1.0 x 0.5 cm lobular mass along the right aspect of the falx, overlying the right frontal lobe (series 4, image 26). In retrospect this finding was present on the prior examination of 07/19/2020 and is compatible with a small incidental meningioma. Mild ill-defined hypoattenuation within the cerebral white matter is  nonspecific, but compatible with chronic small vessel ischemic disease. 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 There is no acute intracranial hemorrhage. No demarcated cortical infarct. No extra-axial fluid collection. No midline shift. Vascular: No hyperdense vessel.  Atherosclerotic calcifications Skull: Normal. Negative for fracture or focal lesion. Sinuses/Orbits: Visualized orbits show no acute finding. Redemonstrated 10 mm polyp versus mucous retention cyst within the superomedial right maxillary sinus. Mild frothy secretions within the right maxillary sinus. Frothy secretions within the right sphenoid sinus. Frothy secretions within a posterior right ethmoid air cell. Background moderate bilateral ethmoid sinus mucosal thickening. Mild mucosal thickening within the left frontal sinus. IMPRESSION: Mildly motion degraded exam. No evidence of acute intracranial abnormality. Incidental 1 cm right parafalcine meningioma, unchanged from the head CT of 07/19/2020. Stable mild cerebral atrophy and chronic small vessel ischemic disease. 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 Paranasal sinus disease as described. Correlate for acute on chronic sinusitis. Electronically Signed   By: Kellie Simmering DO   On: 09/14/2020 18:08   MR FOOT LEFT WO CONTRAST  Result Date: 09/15/2020 CLINICAL DATA:  Osteomyelitis foot EXAM: MRI OF THE LEFT FOOT WITHOUT CONTRAST TECHNIQUE: Multiplanar, multisequence MR imaging of the left was performed. No intravenous contrast was administered. COMPARISON:  None. FINDINGS: Bones/Joint/Cartilage There is a  area of ulceration overlying the distal tuft of the first digit. Cortical irregularity with erosive type change is noted. There is diffuse increased T2 hyperintense signal with T1 hypointensity seen throughout the distal first phalanx. The remainder of the osseous structures appear to be intact. No areas destruction or  periosteal reaction. No large joint effusions are Ligaments The Lisfranc ligaments are intact. Muscles and Tendons Increased signal with atrophy throughout the muscles of forefoot likely due chronic denervation. The flexor and extensor tendons are intact. Soft tissues Focal area ulceration seen overlying the distal first tuft. No loculated fluid collection is. Dorsal subcutaneous edema the forefoot IMPRESSION: Focal area of ulceration overlying the first digit with evidence of osteomyelitis of the distal first phalanx. No loculated fluid collection. Electronically Signed   By: Prudencio Pair M.D.   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 Donzetta Matters, MD Procedure Performed: 1.  Ultrasound-guided cannulation right common femoral artery 2.  Aortogram with bilateral lower extremity runoff 3.  Laser arthrectomy left above and below-knee popliteal arteries with 1.5 mm [place] 4.  Plain balloon angioplasty below-knee popliteal artery with 4 mm balloon 5.  Stent of above-knee popliteal artery with a Elluvia drug-eluting stent 6 x 40 mm 6.  Moderate sedation with fentanyl and Versed for 64 minutes 7.  Mynx device closure right common femoral artery Indications: 65 year old male admitted with Covid has gangrenous changes to the left toes.  He has severely decreased ABIs with monophasic waveforms indicated for aortogram with bilateral lower extremity angiography and possible intervention of the left. Findings: The aorta and iliac segments are free of flow-limiting stenosis.  Renal arteries bilaterally are patent without flow-limiting stenosis.  The right side the SFA and popliteal arteries are patent.  He appears to have two-vessel runoff which is diseased  however without flow limitation via the anterior tibial and posterior tibial artery.  The peroneal artery appears occluded at its origin.  On the left side the SFA is patent up to the abductor and there is a heavily calcified area measuring 3 cm.  Below the knee there is a 90% stenosis measuring 1 cm.  He has single-vessel runoff via the anterior tibial artery which has multiple less than 20% stenoses throughout its course does fill the foot.  After intervention with stenting there is less than 20% stenosis of the above-knee popliteal and after balloon angioplasty below the knee there is 0% residual stenosis without dissection. Patient will need amputation of multiple toes on the left versus transmetatarsal amputation.  Procedure:  The patient was identified in the holding area and taken to room 8.  The patient was then placed supine on the table and prepped and draped in the usual sterile fashion.  A time out was called.  Ultrasound was used to evaluate the right common femoral artery.  This artery was large was compressible and appeared to be free of flow-limiting stenosis or any disease.  The area was anesthetized 1% lidocaine and cannulated with micropuncture needle followed by wire and sheath.  An image was saved the permanent record.  Moderate sedation was administered with fentanyl and Versed his vital signs and heart rhythm were monitored throughout the case.  We placed a Bentson wire followed by 5 [place] sheath and an Omni catheter to the level  of L1 and performed aortogram followed by lower extremity runoff.  We then crossed the bifurcation with Omni catheter Glidewire advantage placed a long 6 French sheath into the SFA patient was fully heparinized.  We then crossed the lesions above the knee and below the knee popliteal with Glidewire advantage and quick cross catheter.  We confirmed intraluminal access we were also able to get better imaging of his foot and distal anterior tibial artery.  We then  performed laser arthrectomy of the above and below-knee popliteal arteries followed by 4 mm balloon angioplasty.  We then primarily stented the above-knee popliteal with a 6 x 40 mm drug-eluting stent and postdilated with a 5 mm balloon.  Completion demonstrated that the area was heavily calcified however the stent was wide open in 2 views.  The lesion below the knee was widely patent with no residual stenosis and no dissection.  We good flow into the foot via the anterior tibial artery which also filled the peroneal at the ankle.  Satisfied with this we exchanged for a short 6 French sheath deployed a minx device which deployed well.  He tolerated procedure without any complication. Contrast: 130 cc Brandon C. Donzetta Matters, MD [facility] Office: [phone] Pager: [phone]   DG CHEST PORT 1 VIEW  Result Date: 09/19/2020 CLINICAL DATA:  65 year old male with cough.  Follow-up sepsis. EXAM: PORTABLE CHEST 1 VIEW COMPARISON:  Chest radiograph dated 09/14/2020. FINDINGS: Background of emphysema. No focal consolidation, pleural effusion, pneumothorax. Chronic right lung base pleural thickening/scarring. The cardiac silhouette is within limits. No acute osseous pathology. IMPRESSION: No active disease. Electronically Signed   By: Anner Crete M.D.   On: 09/19/2020 18:50   DG Chest Portable 1 View  Result Date: 09/14/2020 CLINICAL DATA:  Cough.  Fever. EXAM: PORTABLE CHEST 1 VIEW COMPARISON:  07/28/2020.  07/22/2020.  05/31/2019. FINDINGS: Mediastinum and hilar structures are stable. Heart size normal. No focal infiltrate. Stable right base pleural thickening consistent with scarring. No pneumothorax. IMPRESSION: No acute cardiopulmonary disease. Electronically Signed   By: Marcello Moores  Register   On: 09/14/2020 15:31   DG Foot Complete Left  Result Date: 09/14/2020 CLINICAL DATA:  Multiple chronic wounds EXAM: LEFT FOOT - COMPLETE 3+ VIEW COMPARISON:  None. FINDINGS: Large ulcer  or wound at the distal first digit. Bony destructive change involving midportion of first distal phalanx with probable pathologic fracture. Small erosions at the base of the first distal phalanx and head of the proximal phalanx on the medial side. Possible wound or ulcer at the tip of the third distal digit with mild erosion of the tuft of the distal phalanx third digit. No radiopaque foreign body. Vascular calcifications. Degenerative changes at the first MTP joint IMPRESSION: 1. Large ulcer or wound at the distal first digit with bony destructive change/suspected osteomyelitis involving the distal phalanx with probable pathologic fracture through the mid segment. 2. Small erosions at the first distal phalanx and head of the first proximal phalanx, suspect for osteomyelitis. 3. Possible wound or ulcer at the tip of the third distal digit with possible erosion of the tuft of the distal phalanx, question osteomyelitis. Electronically Signed   By: Donavan Foil M.D.   On: 09/14/2020 18:36   VAS [place] ABI WITH/WO TBI  Result Date: 09/17/2020 LOWER EXTREMITY DOPPLER STUDY Indications: Ulceration. High Risk Factors: Hypertension.  Comparison Study: No prior studies. Performing Technologist: Carlos Levering RVT  Examination Guidelines: A complete evaluation includes at minimum, Doppler waveform signals and systolic  blood pressure reading at the level of bilateral brachial, anterior tibial, and posterior tibial arteries, when vessel segments are accessible. Bilateral testing is considered an integral part of a complete examination. Photoelectric Plethysmograph (PPG) waveforms and toe systolic pressure readings are included as required and additional duplex testing as needed. Limited examinations for reoccurring indications may be performed as noted.  ABI Findings: +---------+------------------+-----+----------+--------+ Right    Rt Pressure (mmHg)IndexWaveform  Comment   +---------+------------------+-----+----------+--------+ Brachial 147                    triphasic          +---------+------------------+-----+----------+--------+ PTA      135               0.92 biphasic           +---------+------------------+-----+----------+--------+ DP       78                0.53 monophasic         +---------+------------------+-----+----------+--------+ Great Toe61                0.41                    +---------+------------------+-----+----------+--------+ +---------+------------------+-----+----------+-------+ Left     Lt Pressure (mmHg)IndexWaveform  Comment +---------+------------------+-----+----------+-------+ Brachial 142                    triphasic         +---------+------------------+-----+----------+-------+ PTA      68                0.46 monophasic        +---------+------------------+-----+----------+-------+ DP       89                0.61 monophasic        +---------+------------------+-----+----------+-------+ Great Toe                                 Wound   +---------+------------------+-----+----------+-------+ +-------+-----------+-----------+------------+------------+ ABI/TBIToday's ABIToday's TBIPrevious ABIPrevious TBI +-------+-----------+-----------+------------+------------+ Right  0.92       0.41                                +-------+-----------+-----------+------------+------------+ Left   0.61                                           +-------+-----------+-----------+------------+------------+  Summary: Right: Resting right ankle-brachial index indicates mild right lower extremity arterial disease. The right toe-brachial index is abnormal. Left: Resting left ankle-brachial index indicates moderate left lower extremity arterial disease. Unable to obtain TBI due to great toe ulcer.  *See table(s) above for measurements and observations.  Electronically signed by Waverly Ferrarihristopher Dickson MD on  09/17/2020 at 3:12:20 PM.    Final     Subjective: No new complaints  Discharge Exam: Vitals:   09/27/20 0333 09/27/20 0737  BP:  127/72  Pulse: 92 96  Resp: 18 18  Temp: 98.5 F (36.9 C) 98.3 F (36.8 C)  SpO2:  100%   Vitals:   09/26/20 2334 09/27/20 0332 09/27/20 0333 09/27/20 0737  BP: (!) 143/86 (!) 153/82  127/72  Pulse: 90  92 96  Resp: 18  18 18  Temp: 98.7 F (37.1 C)  98.5 F (36.9 C) 98.3 F (36.8 C)  TempSrc: Oral  Oral Oral  SpO2: 100% 100%  100%  Weight:   67.3 kg   Height:        General: Pt is alert, awake, not in acute distress Cardiovascular: RRR, S1/S2 +, no rubs, no gallops Respiratory: CTA bilaterally, no wheezing, no rhonchi Abdominal: Soft, NT, ND, bowel sounds + Extremities: no edema, no cyanosis    The results of significant diagnostics from this hospitalization (including imaging, microbiology, ancillary and laboratory) are listed below for reference.     Microbiology: No results found for this or any previous visit (from the past 240 hour(s)).   Labs: BNP (last 3 results) Recent Labs    07/21/20 0206 07/22/20 0007  BNP 109.2* [id number]*   Basic Metabolic Panel: Recent Labs  Lab 09/21/20 0238 09/22/20 0111 09/23/20 0239 09/24/20 0247 09/25/20 0118 09/26/20 0318  NA 132* 135 135 138 139 140  K 4.2 4.1 4.5 4.1 4.0 4.7  CL 107 109 112* 112* 114* 114*  CO2 17* 17* 16* 17* 18* 18*  GLUCOSE 113* 91 121* 82 94 110*  BUN 6* 5* 12 8 9 8   CREATININE 0.91 0.92 1.21 1.11 1.18 1.08  CALCIUM 8.8* 8.7* 8.7* 9.2 8.9 9.0  MG 1.9 1.8  --   --   --   --    Liver Function Tests: Recent Labs  Lab 09/21/20 0238 09/22/20 0111  AST 15 11*  ALT 9 10  ALKPHOS 50 54  BILITOT 0.8 0.7  PROT 5.6* 5.8*  ALBUMIN 2.0* 2.1*   No results for input(s): LIPASE, AMYLASE in the last 168 hours. No results for input(s): AMMONIA in the last 168 hours. CBC: Recent Labs  Lab 09/21/20 0238 09/22/20 0111 09/23/20 0239 09/24/20 0247 09/25/20 0118  09/26/20 0318  WBC 9.0 9.4 15.0* 11.0* 10.4 8.0  NEUTROABS 6.1 6.0  --   --   --   --   HGB 7.8* 8.2* 7.8* 7.7* 7.6* 8.5*  HCT 24.8* 26.6* 23.7* 23.8* 24.5* 27.0*  MCV 91.9 92.4 91.2 90.2 91.8 92.2  PLT 464* 512* 535* 501* 481* 527*   Cardiac Enzymes: No results for input(s): CKTOTAL, CKMB, CKMBINDEX, TROPONINI in the last 168 hours. BNP: Invalid input(s): POCBNP CBG: Recent Labs  Lab 09/23/20 0613  GLUCAP 108*   D-Dimer No results for input(s): DDIMER in the last 72 hours. Hgb A1c No results for input(s): HGBA1C in the last 72 hours. Lipid Profile No results for input(s): CHOL, HDL, LDLCALC, TRIG, CHOLHDL, LDLDIRECT in the last 72 hours. Thyroid function studies No results for input(s): TSH, T4TOTAL, T3FREE, THYROIDAB in the last 72 hours.  Invalid input(s): FREET3 Anemia work up No results for input(s): VITAMINB12, FOLATE, FERRITIN, TIBC, IRON, RETICCTPCT in the last 72 hours. Urinalysis    Component Value Date/Time   COLORURINE AMBER (A) 09/14/2020 1508   APPEARANCEUR HAZY (A) 09/14/2020 1508   LABSPEC 1.019 09/14/2020 1508   PHURINE 7.0 09/14/2020 1508   GLUCOSEU NEGATIVE 09/14/2020 1508   HGBUR NEGATIVE 09/14/2020 1508   BILIRUBINUR NEGATIVE 09/14/2020 1508   KETONESUR 5 (A) 09/14/2020 1508   PROTEINUR 30 (A) 09/14/2020 1508   NITRITE NEGATIVE 09/14/2020 1508   LEUKOCYTESUR NEGATIVE 09/14/2020 1508   Sepsis Labs Invalid input(s): PROCALCITONIN,  WBC,  LACTICIDVEN Microbiology No results found for this or any previous visit (from the past 240 hour(s)).   Time coordinating discharge: Over 30 minutes  SIGNED:  Charlynne Cousins, MD  [facility] 09/27/2020, 10:57 AM Pager   If 7PM-7AM, please contact night-coverage [email or web address] Password [email or web address]

## 2020-10-04 ENCOUNTER — Encounter: Payer: Self-pay | Admitting: Physician Assistant

## 2020-10-04 ENCOUNTER — Ambulatory Visit (INDEPENDENT_AMBULATORY_CARE_PROVIDER_SITE_OTHER): Payer: Medicare (Managed Care) | Admitting: Physician Assistant

## 2020-10-04 VITALS — Ht 73.0 in | Wt 148.0 lb

## 2020-10-04 DIAGNOSIS — M86072 Acute hematogenous osteomyelitis, left ankle and foot: Secondary | ICD-10-CM
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
## 2020-10-11 ENCOUNTER — Ambulatory Visit: Payer: Medicare (Managed Care) | Admitting: Physician Assistant

## 2020-10-21 ENCOUNTER — Encounter: Payer: Self-pay | Admitting: Orthopedic Surgery

## 2020-10-21 ENCOUNTER — Ambulatory Visit (INDEPENDENT_AMBULATORY_CARE_PROVIDER_SITE_OTHER): Payer: Medicare (Managed Care) | Admitting: Physician Assistant

## 2020-10-21 DIAGNOSIS — M86072 Acute hematogenous osteomyelitis, left ankle and foot: Secondary | ICD-10-CM
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
## 2020-10-27 ENCOUNTER — Telehealth: Payer: Self-pay | Admitting: Orthopedic Surgery

## 2020-10-27 NOTE — Telephone Encounter (Signed)
Seth [name] with [facility] called to get PT orders approved with the frequency of twice a week for 3 weeks and then once a week for 5 weeks. [name] would also like to get the pts weight baring status.  Seth [name] CB# [phone]

## 2020-10-28 NOTE — Telephone Encounter (Signed)
Called and gave verbal ok for orders below and pt can be wtb as tolerated.

## 2020-11-11 ENCOUNTER — Encounter: Payer: Self-pay | Admitting: Orthopedic Surgery

## 2020-11-11 ENCOUNTER — Other Ambulatory Visit: Payer: Self-pay

## 2020-11-11 ENCOUNTER — Ambulatory Visit (INDEPENDENT_AMBULATORY_CARE_PROVIDER_SITE_OTHER): Payer: Medicare (Managed Care) | Admitting: Physician Assistant

## 2020-11-11 DIAGNOSIS — M86072 Acute hematogenous osteomyelitis, left ankle and foot: Secondary | ICD-10-CM
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
## 2020-11-23 ENCOUNTER — Telehealth: Payer: Self-pay | Admitting: Orthopedic Surgery

## 2020-11-23 NOTE — Telephone Encounter (Signed)
[name] from [facility] called. She would like verbal orders for wound care and to extend PT 1x wk for 8wk. Also would like Dr. Sharol Given to know that he has a lot of drainage from wound. Informed her to have the patient to call and make an appointment sooner than 4/14.

## 2020-11-23 NOTE — Telephone Encounter (Signed)
Will hold pt is on the schedule tomorrow. Will call after appt with updated orders.

## 2020-11-24 ENCOUNTER — Ambulatory Visit: Payer: Medicare (Managed Care) | Admitting: Physician Assistant

## 2020-11-26 ENCOUNTER — Ambulatory Visit: Payer: Medicare (Managed Care) | Admitting: Physician Assistant

## 2020-11-30 NOTE — Telephone Encounter (Signed)
Pt has resch several appts. Now scheduled for 12/09/20. Will address at visit.

## 2020-12-04 ENCOUNTER — Other Ambulatory Visit: Payer: Self-pay

## 2020-12-04 DIAGNOSIS — M86072 Acute hematogenous osteomyelitis, left ankle and foot: Secondary | ICD-10-CM

## 2020-12-09 ENCOUNTER — Ambulatory Visit: Payer: Medicare (Managed Care) | Admitting: Orthopedic Surgery

## 2020-12-17 ENCOUNTER — Encounter: Payer: Self-pay | Admitting: Physician Assistant

## 2020-12-17 ENCOUNTER — Ambulatory Visit (INDEPENDENT_AMBULATORY_CARE_PROVIDER_SITE_OTHER): Payer: Medicare HMO | Admitting: Physician Assistant

## 2020-12-17 DIAGNOSIS — M86072 Acute hematogenous osteomyelitis, left ankle and foot: Secondary | ICD-10-CM
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
## 2020-12-22 ENCOUNTER — Ambulatory Visit (HOSPITAL_COMMUNITY): Payer: Medicare HMO | Attending: Vascular Surgery

## 2020-12-22 ENCOUNTER — Encounter: Payer: Medicare HMO | Admitting: Vascular Surgery

## 2020-12-22 ENCOUNTER — Ambulatory Visit (HOSPITAL_COMMUNITY): Payer: Medicare HMO

## 2021-01-01 ENCOUNTER — Inpatient Hospital Stay (HOSPITAL_COMMUNITY)
Admission: EM | Admit: 2021-01-01 | Discharge: 2021-01-22 | DRG: 064 | Disposition: A | Discharge status: Skilled Nursing Facility | Payer: Medicare Other | Attending: Internal Medicine | Admitting: Internal Medicine

## 2021-01-01 ENCOUNTER — Emergency Department (HOSPITAL_COMMUNITY): Payer: Medicare Other

## 2021-01-01 ENCOUNTER — Encounter (HOSPITAL_COMMUNITY): Payer: Self-pay

## 2021-01-01 DIAGNOSIS — E860 Dehydration: Secondary | ICD-10-CM | POA: Diagnosis not present

## 2021-01-01 DIAGNOSIS — R6510 Systemic inflammatory response syndrome (SIRS) of non-infectious origin without acute organ dysfunction: Secondary | ICD-10-CM | POA: Diagnosis present

## 2021-01-01 DIAGNOSIS — E43 Unspecified severe protein-calorie malnutrition: Secondary | ICD-10-CM | POA: Diagnosis not present

## 2021-01-01 DIAGNOSIS — F17210 Nicotine dependence, cigarettes, uncomplicated: Secondary | ICD-10-CM | POA: Diagnosis present

## 2021-01-01 DIAGNOSIS — D539 Nutritional anemia, unspecified: Secondary | ICD-10-CM | POA: Diagnosis present

## 2021-01-01 DIAGNOSIS — A419 Sepsis, unspecified organism: Secondary | ICD-10-CM | POA: Diagnosis not present

## 2021-01-01 DIAGNOSIS — Z8673 Personal history of transient ischemic attack (TIA), and cerebral infarction without residual deficits: Secondary | ICD-10-CM | POA: Diagnosis not present

## 2021-01-01 DIAGNOSIS — E162 Hypoglycemia, unspecified: Secondary | ICD-10-CM | POA: Diagnosis present

## 2021-01-01 DIAGNOSIS — Z681 Body mass index (BMI) 19 or less, adult: Secondary | ICD-10-CM

## 2021-01-01 DIAGNOSIS — E871 Hypo-osmolality and hyponatremia: Secondary | ICD-10-CM | POA: Diagnosis not present

## 2021-01-01 DIAGNOSIS — E875 Hyperkalemia: Secondary | ICD-10-CM | POA: Diagnosis not present

## 2021-01-01 DIAGNOSIS — I739 Peripheral vascular disease, unspecified: Secondary | ICD-10-CM | POA: Diagnosis present

## 2021-01-01 DIAGNOSIS — I4729 Other ventricular tachycardia: Secondary | ICD-10-CM

## 2021-01-01 DIAGNOSIS — E778 Other disorders of glycoprotein metabolism: Secondary | ICD-10-CM | POA: Diagnosis present

## 2021-01-01 DIAGNOSIS — M109 Gout, unspecified: Secondary | ICD-10-CM | POA: Diagnosis present

## 2021-01-01 DIAGNOSIS — G9341 Metabolic encephalopathy: Secondary | ICD-10-CM | POA: Diagnosis present

## 2021-01-01 DIAGNOSIS — G9340 Encephalopathy, unspecified: Secondary | ICD-10-CM | POA: Diagnosis present

## 2021-01-01 DIAGNOSIS — Z743 Need for continuous supervision: Secondary | ICD-10-CM | POA: Diagnosis not present

## 2021-01-01 DIAGNOSIS — G8191 Hemiplegia, unspecified affecting right dominant side: Secondary | ICD-10-CM | POA: Diagnosis not present

## 2021-01-01 DIAGNOSIS — J449 Chronic obstructive pulmonary disease, unspecified: Secondary | ICD-10-CM | POA: Diagnosis not present

## 2021-01-01 DIAGNOSIS — I462 Cardiac arrest due to underlying cardiac condition: Secondary | ICD-10-CM | POA: Diagnosis not present

## 2021-01-01 DIAGNOSIS — E161 Other hypoglycemia: Secondary | ICD-10-CM | POA: Diagnosis not present

## 2021-01-01 DIAGNOSIS — Z20822 Contact with and (suspected) exposure to covid-19: Secondary | ICD-10-CM | POA: Diagnosis not present

## 2021-01-01 DIAGNOSIS — Z7289 Other problems related to lifestyle: Secondary | ICD-10-CM | POA: Diagnosis not present

## 2021-01-01 DIAGNOSIS — F32A Depression, unspecified: Secondary | ICD-10-CM | POA: Diagnosis not present

## 2021-01-01 DIAGNOSIS — Z7982 Long term (current) use of aspirin: Secondary | ICD-10-CM

## 2021-01-01 DIAGNOSIS — F10231 Alcohol dependence with withdrawal delirium: Secondary | ICD-10-CM | POA: Diagnosis not present

## 2021-01-01 DIAGNOSIS — R569 Unspecified convulsions: Secondary | ICD-10-CM | POA: Diagnosis not present

## 2021-01-01 DIAGNOSIS — Z7951 Long term (current) use of inhaled steroids: Secondary | ICD-10-CM

## 2021-01-01 DIAGNOSIS — Z7902 Long term (current) use of antithrombotics/antiplatelets: Secondary | ICD-10-CM

## 2021-01-01 DIAGNOSIS — R441 Visual hallucinations: Secondary | ICD-10-CM | POA: Diagnosis present

## 2021-01-01 DIAGNOSIS — Z8616 Personal history of COVID-19: Secondary | ICD-10-CM

## 2021-01-01 DIAGNOSIS — D689 Coagulation defect, unspecified: Secondary | ICD-10-CM | POA: Diagnosis not present

## 2021-01-01 DIAGNOSIS — J690 Pneumonitis due to inhalation of food and vomit: Secondary | ICD-10-CM | POA: Diagnosis not present

## 2021-01-01 DIAGNOSIS — I472 Ventricular tachycardia: Secondary | ICD-10-CM | POA: Diagnosis not present

## 2021-01-01 DIAGNOSIS — E8809 Other disorders of plasma-protein metabolism, not elsewhere classified: Secondary | ICD-10-CM | POA: Diagnosis present

## 2021-01-01 DIAGNOSIS — R000 Tachycardia, unspecified: Secondary | ICD-10-CM | POA: Diagnosis not present

## 2021-01-01 DIAGNOSIS — R4182 Altered mental status, unspecified: Secondary | ICD-10-CM | POA: Diagnosis not present

## 2021-01-01 DIAGNOSIS — R451 Restlessness and agitation: Secondary | ICD-10-CM | POA: Diagnosis not present

## 2021-01-01 DIAGNOSIS — I10 Essential (primary) hypertension: Secondary | ICD-10-CM | POA: Diagnosis not present

## 2021-01-01 DIAGNOSIS — R404 Transient alteration of awareness: Secondary | ICD-10-CM | POA: Diagnosis not present

## 2021-01-01 DIAGNOSIS — I6201 Nontraumatic acute subdural hemorrhage: Principal | ICD-10-CM | POA: Diagnosis present

## 2021-01-01 DIAGNOSIS — R509 Fever, unspecified: Secondary | ICD-10-CM

## 2021-01-01 DIAGNOSIS — I469 Cardiac arrest, cause unspecified: Secondary | ICD-10-CM | POA: Diagnosis not present

## 2021-01-01 DIAGNOSIS — R5381 Other malaise: Secondary | ICD-10-CM | POA: Diagnosis present

## 2021-01-01 DIAGNOSIS — Z789 Other specified health status: Secondary | ICD-10-CM

## 2021-01-01 DIAGNOSIS — S065X9A Traumatic subdural hemorrhage with loss of consciousness of unspecified duration, initial encounter: Secondary | ICD-10-CM | POA: Diagnosis present

## 2021-01-01 DIAGNOSIS — I951 Orthostatic hypotension: Secondary | ICD-10-CM | POA: Diagnosis present

## 2021-01-01 DIAGNOSIS — E876 Hypokalemia: Secondary | ICD-10-CM | POA: Diagnosis not present

## 2021-01-01 DIAGNOSIS — R5600 Simple febrile convulsions: Secondary | ICD-10-CM | POA: Diagnosis not present

## 2021-01-01 DIAGNOSIS — S065XAA Traumatic subdural hemorrhage with loss of consciousness status unknown, initial encounter: Secondary | ICD-10-CM | POA: Diagnosis present

## 2021-01-01 DIAGNOSIS — D75839 Thrombocytosis, unspecified: Secondary | ICD-10-CM | POA: Diagnosis present

## 2021-01-01 DIAGNOSIS — N179 Acute kidney failure, unspecified: Secondary | ICD-10-CM | POA: Diagnosis not present

## 2021-01-01 DIAGNOSIS — I499 Cardiac arrhythmia, unspecified: Secondary | ICD-10-CM | POA: Diagnosis not present

## 2021-01-01 DIAGNOSIS — R059 Cough, unspecified: Secondary | ICD-10-CM

## 2021-01-01 DIAGNOSIS — R1310 Dysphagia, unspecified: Secondary | ICD-10-CM | POA: Diagnosis present

## 2021-01-01 DIAGNOSIS — F1010 Alcohol abuse, uncomplicated: Secondary | ICD-10-CM | POA: Diagnosis present

## 2021-01-01 DIAGNOSIS — N400 Enlarged prostate without lower urinary tract symptoms: Secondary | ICD-10-CM | POA: Diagnosis present

## 2021-01-01 DIAGNOSIS — Z4659 Encounter for fitting and adjustment of other gastrointestinal appliance and device: Secondary | ICD-10-CM

## 2021-01-01 DIAGNOSIS — R6889 Other general symptoms and signs: Secondary | ICD-10-CM | POA: Diagnosis not present

## 2021-01-01 DIAGNOSIS — Z79899 Other long term (current) drug therapy: Secondary | ICD-10-CM

## 2021-01-01 DIAGNOSIS — R0602 Shortness of breath: Secondary | ICD-10-CM

## 2021-01-01 DIAGNOSIS — D6489 Other specified anemias: Secondary | ICD-10-CM | POA: Diagnosis present

## 2021-01-01 DIAGNOSIS — Z89422 Acquired absence of other left toe(s): Secondary | ICD-10-CM

## 2021-01-01 DIAGNOSIS — F1090 Alcohol use, unspecified, uncomplicated: Secondary | ICD-10-CM

## 2021-01-01 DIAGNOSIS — J9811 Atelectasis: Secondary | ICD-10-CM | POA: Diagnosis present

## 2021-01-01 DIAGNOSIS — Z8249 Family history of ischemic heart disease and other diseases of the circulatory system: Secondary | ICD-10-CM

## 2021-01-01 DIAGNOSIS — Z9114 Patient's other noncompliance with medication regimen: Secondary | ICD-10-CM

## 2021-01-01 LAB — CBC WITH DIFFERENTIAL/PLATELET
Abs Immature Granulocytes: 0.03 10*3/uL (ref 0.00–0.07)
Basophils Absolute: 0 10*3/uL (ref 0.0–0.1)
Basophils Relative: 0 %
Eosinophils Absolute: 0 10*3/uL (ref 0.0–0.5)
Eosinophils Relative: 0 %
HCT: 27.4 % — ABNORMAL LOW (ref 39.0–52.0)
Hemoglobin: 8.6 g/dL — ABNORMAL LOW (ref 13.0–17.0)
Immature Granulocytes: 1 %
Lymphocytes Relative: 10 %
Lymphs Abs: 0.6 10*3/uL — ABNORMAL LOW (ref 0.7–4.0)
MCH: 33.5 pg (ref 26.0–34.0)
MCHC: 31.4 g/dL (ref 30.0–36.0)
MCV: 106.6 fL — ABNORMAL HIGH (ref 80.0–100.0)
Monocytes Absolute: 0.6 10*3/uL (ref 0.1–1.0)
Monocytes Relative: 10 %
Neutro Abs: 4.9 10*3/uL (ref 1.7–7.7)
Neutrophils Relative %: 79 %
Platelets: 127 10*3/uL — ABNORMAL LOW (ref 150–400)
RBC: 2.57 MIL/uL — ABNORMAL LOW (ref 4.22–5.81)
RDW: 17.9 % — ABNORMAL HIGH (ref 11.5–15.5)
WBC: 6.1 10*3/uL (ref 4.0–10.5)
nRBC: 2.8 % — ABNORMAL HIGH (ref 0.0–0.2)

## 2021-01-01 LAB — COMPREHENSIVE METABOLIC PANEL
ALT: 14 U/L (ref 0–44)
AST: 46 U/L — ABNORMAL HIGH (ref 15–41)
Albumin: 2.7 g/dL — ABNORMAL LOW (ref 3.5–5.0)
Alkaline Phosphatase: 65 U/L (ref 38–126)
Anion gap: 18 — ABNORMAL HIGH (ref 5–15)
BUN: 14 mg/dL (ref 8–23)
CO2: 10 mmol/L — ABNORMAL LOW (ref 22–32)
Calcium: 6.6 mg/dL — ABNORMAL LOW (ref 8.9–10.3)
Chloride: 107 mmol/L (ref 98–111)
Creatinine, Ser: 1.51 mg/dL — ABNORMAL HIGH (ref 0.61–1.24)
GFR, Estimated: 52 mL/min — ABNORMAL LOW (ref >60)
Glucose, Bld: 64 mg/dL — ABNORMAL LOW (ref 70–99)
Potassium: 5.6 mmol/L — ABNORMAL HIGH (ref 3.5–5.1)
Sodium: 135 mmol/L (ref 135–145)
Total Bilirubin: 3.2 mg/dL — ABNORMAL HIGH (ref 0.3–1.2)
Total Protein: 4.9 g/dL — ABNORMAL LOW (ref 6.5–8.1)

## 2021-01-01 LAB — URINALYSIS, ROUTINE W REFLEX MICROSCOPIC
Bacteria, UA: NONE SEEN
Bilirubin Urine: NEGATIVE
Glucose, UA: NEGATIVE mg/dL
Ketones, ur: 80 mg/dL — AB
Leukocytes,Ua: NEGATIVE
Nitrite: NEGATIVE
Protein, ur: 30 mg/dL — AB
Specific Gravity, Urine: 1.015 (ref 1.005–1.030)
pH: 6 (ref 5.0–8.0)

## 2021-01-01 LAB — PROTIME-INR
INR: 0.9 (ref 0.8–1.2)
Prothrombin Time: 12.3 seconds (ref 11.4–15.2)

## 2021-01-01 LAB — RESP PANEL BY RT-PCR (FLU A&B, COVID) ARPGX2
Influenza A by PCR: NEGATIVE
Influenza B by PCR: NEGATIVE
SARS Coronavirus 2 by RT PCR: NEGATIVE

## 2021-01-01 LAB — LACTIC ACID, PLASMA: Lactic Acid, Venous: 2.8 mmol/L (ref 0.5–1.9)

## 2021-01-01 IMAGING — DX DG CHEST 2V
3 series · 3 of 3 positions shown · non-contrast
Comparison: [DATE]

CLINICAL DATA: Altered mental status and possible sepsis

EXAM:
CHEST - 2 VIEW

[chest lat (1 of 2)]
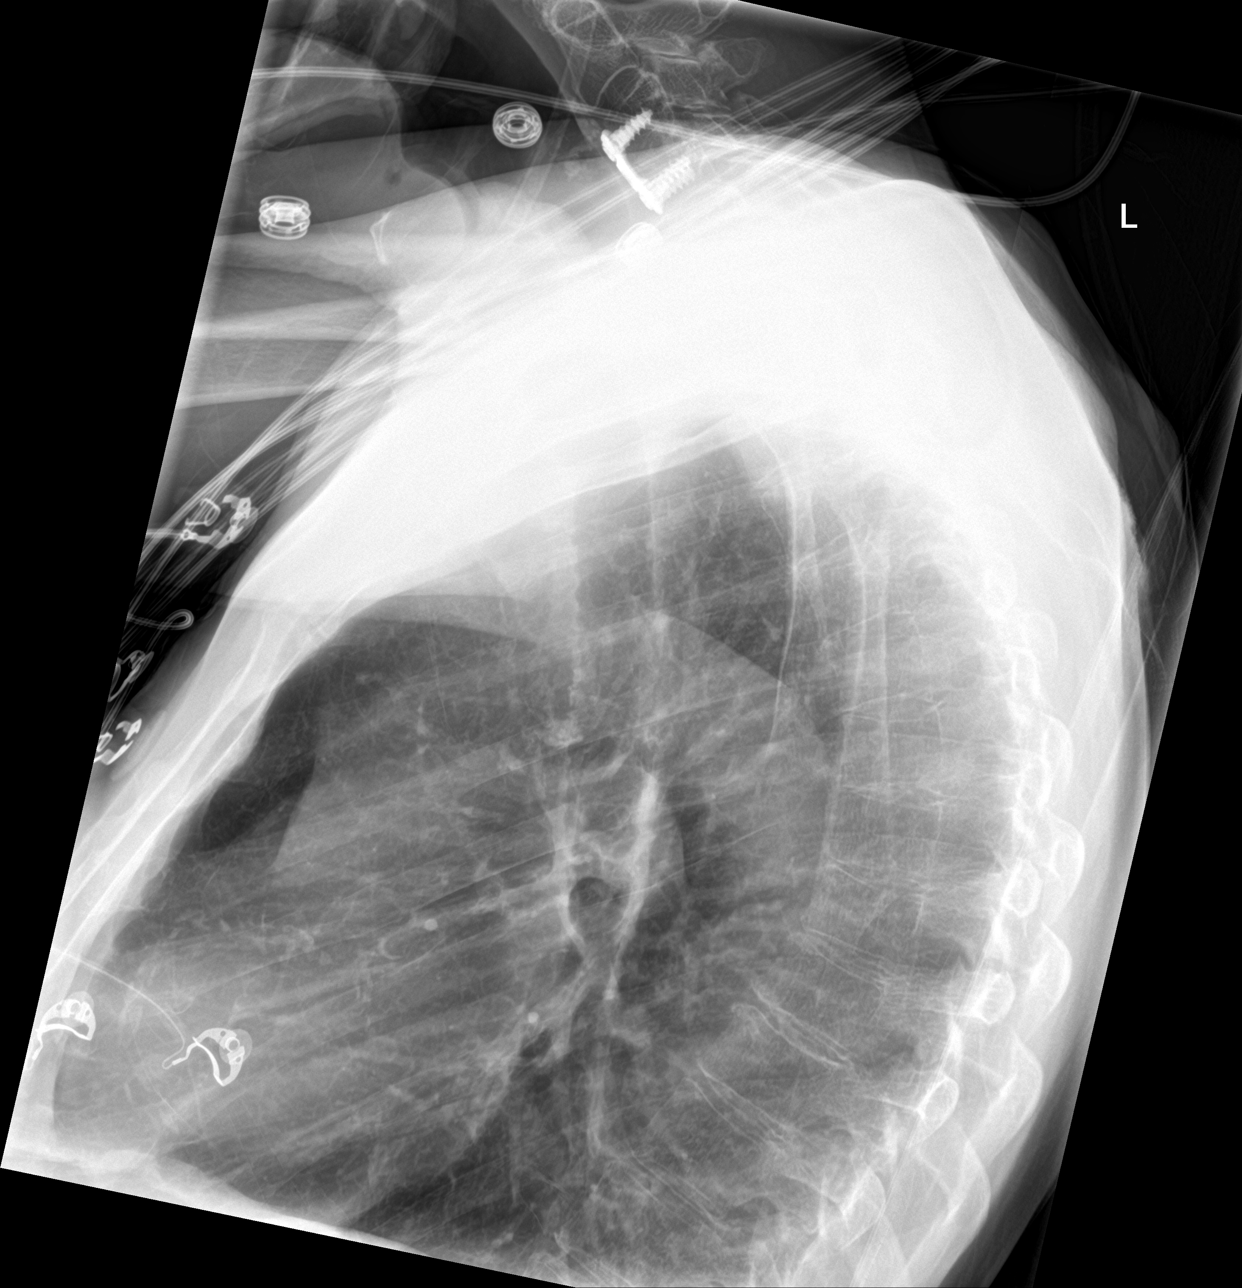

[chest ap]
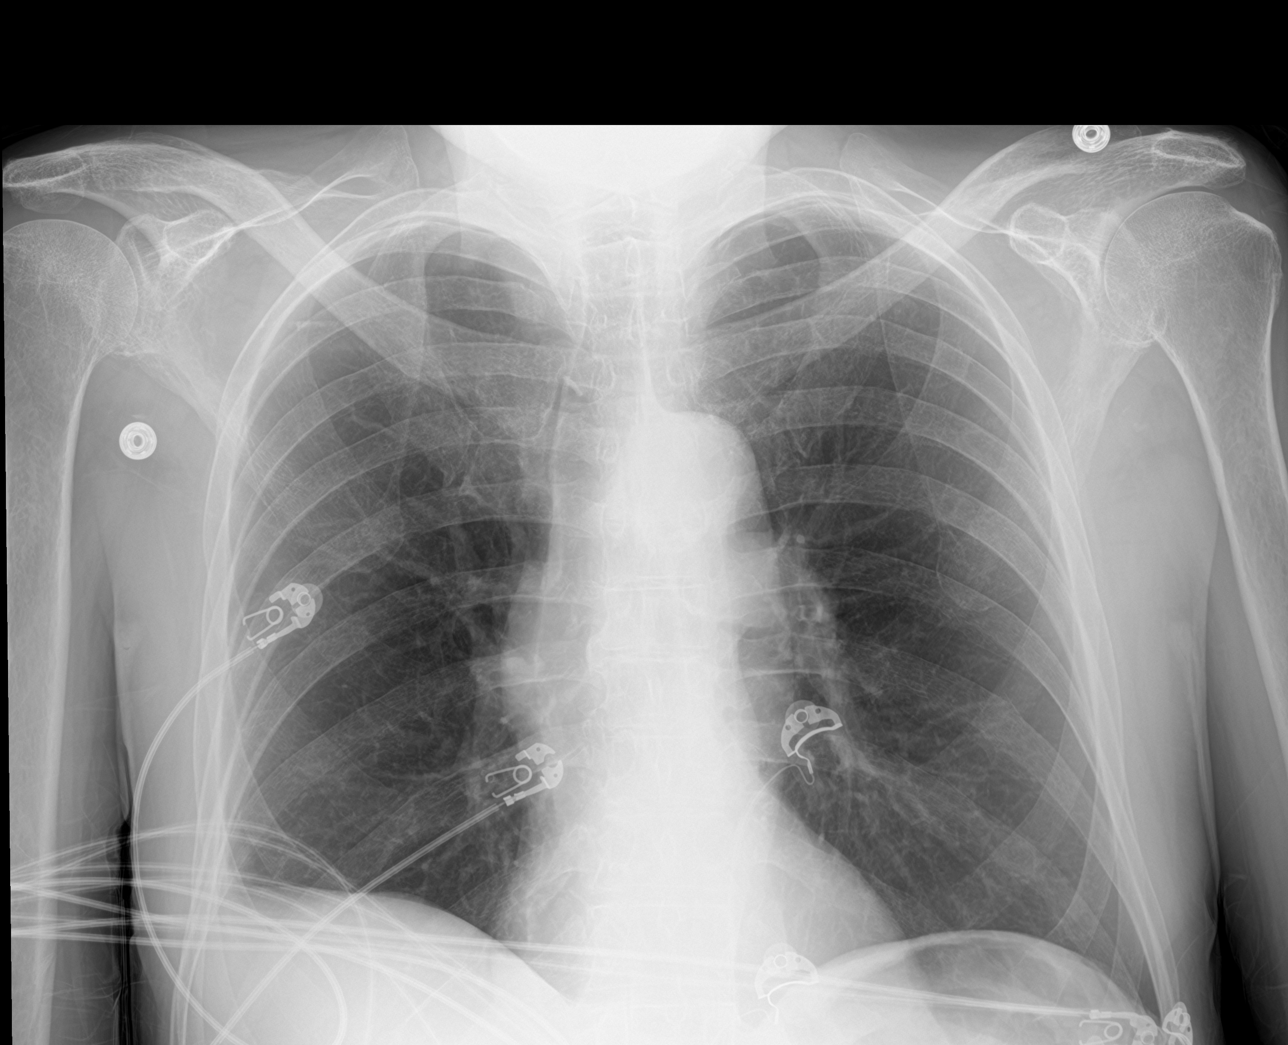

[chest lat (2 of 2)]
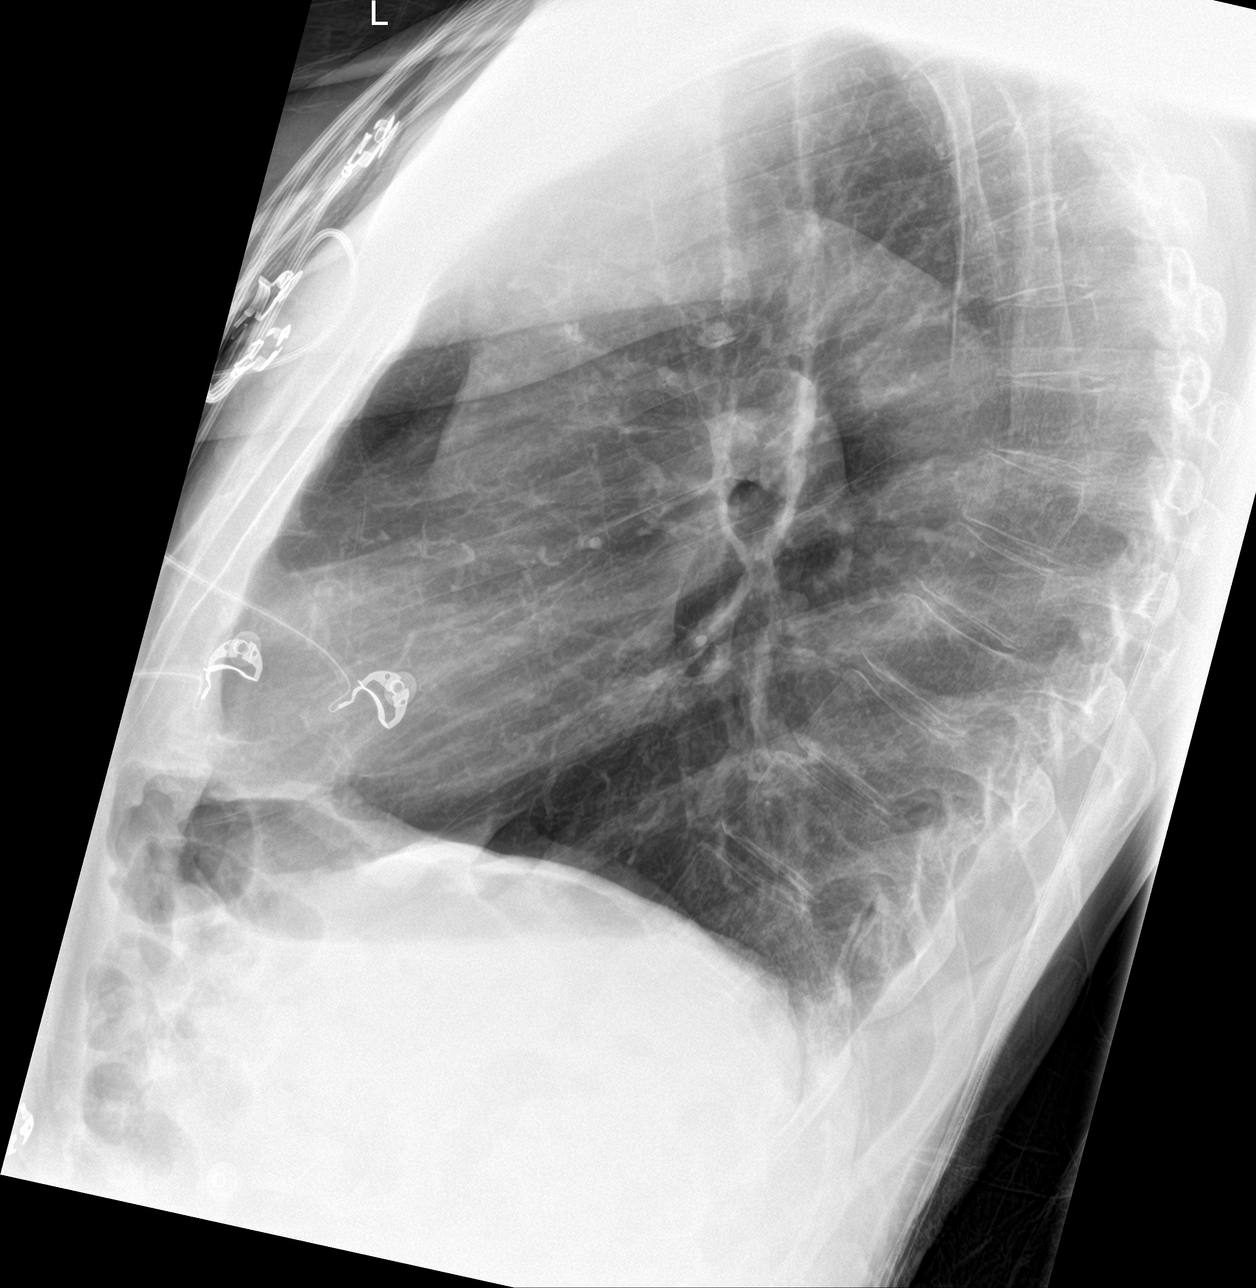

[3 of 3 positions shown; findings below may reference images not displayed]

FINDINGS: The heart size and mediastinal contours are within normal limits.
Both lungs are clear. The visualized skeletal structures are
unremarkable.
IMPRESSION: No active cardiopulmonary disease.

## 2021-01-01 MED ORDER — ASPIRIN 81 MG PO CHEW
81.0000 mg | CHEWABLE_TABLET | Freq: Every day | ORAL | Status: DC
  Administered 2021-01-02 – 2021-01-13 (×12): 81 mg via ORAL
  Filled 2021-01-01 (×12): qty 1

## 2021-01-01 MED ORDER — FINASTERIDE 5 MG PO TABS
5.0000 mg | ORAL_TABLET | Freq: Every day | ORAL | Status: DC
  Administered 2021-01-02 – 2021-01-13 (×12): 5 mg via ORAL
  Filled 2021-01-01 (×13): qty 1

## 2021-01-01 MED ORDER — METOPROLOL TARTRATE 12.5 MG HALF TABLET
12.5000 mg | ORAL_TABLET | Freq: Two times a day (BID) | ORAL | Status: DC
  Administered 2021-01-02 (×3): 12.5 mg via ORAL
  Filled 2021-01-01 (×4): qty 1

## 2021-01-01 MED ORDER — SODIUM ZIRCONIUM CYCLOSILICATE 10 G PO PACK
10.0000 g | PACK | Freq: Once | ORAL | Status: AC
  Administered 2021-01-02: 10 g via ORAL
  Filled 2021-01-01: qty 1

## 2021-01-01 MED ORDER — ONDANSETRON HCL 4 MG PO TABS: 4.0000 mg | ORAL_TABLET | Freq: Four times a day (QID) | ORAL | Status: DC | PRN

## 2021-01-01 MED ORDER — PIPERACILLIN-TAZOBACTAM 3.375 G IVPB
3.3750 g | Freq: Once | INTRAVENOUS | Status: AC
  Administered 2021-01-01: 3.375 g via INTRAVENOUS
  Filled 2021-01-01: qty 50

## 2021-01-01 MED ORDER — THIAMINE HCL 100 MG PO TABS
100.0000 mg | ORAL_TABLET | Freq: Every day | ORAL | Status: DC
  Administered 2021-01-02 – 2021-01-12 (×11): 100 mg via ORAL
  Filled 2021-01-01 (×12): qty 1

## 2021-01-01 MED ORDER — ENOXAPARIN SODIUM 40 MG/0.4ML IJ SOSY
40.0000 mg | PREFILLED_SYRINGE | Freq: Every day | INTRAMUSCULAR | Status: DC
  Administered 2021-01-02 – 2021-01-03 (×2): 40 mg via SUBCUTANEOUS
  Filled 2021-01-01 (×2): qty 0.4

## 2021-01-01 MED ORDER — ACETAMINOPHEN 650 MG RE SUPP: 650.0000 mg | Freq: Four times a day (QID) | RECTAL | Status: DC | PRN

## 2021-01-01 MED ORDER — GUAIFENESIN ER 600 MG PO TB12
600.0000 mg | ORAL_TABLET | Freq: Two times a day (BID) | ORAL | Status: DC | PRN
  Administered 2021-01-02: 600 mg via ORAL
  Filled 2021-01-01 (×2): qty 1

## 2021-01-01 MED ORDER — LACTATED RINGERS IV SOLN: INTRAVENOUS | Status: AC

## 2021-01-01 MED ORDER — ONDANSETRON HCL 4 MG/2ML IJ SOLN: 4.0000 mg | Freq: Four times a day (QID) | INTRAMUSCULAR | Status: DC | PRN

## 2021-01-01 MED ORDER — CLOPIDOGREL BISULFATE 75 MG PO TABS
75.0000 mg | ORAL_TABLET | Freq: Every day | ORAL | Status: DC
  Administered 2021-01-02 – 2021-01-03 (×2): 75 mg via ORAL
  Filled 2021-01-01 (×2): qty 1

## 2021-01-01 MED ORDER — VANCOMYCIN HCL 1250 MG/250ML IV SOLN
1250.0000 mg | Freq: Once | INTRAVENOUS | Status: AC
  Administered 2021-01-02: 1250 mg via INTRAVENOUS
  Filled 2021-01-01 (×2): qty 250

## 2021-01-01 MED ORDER — ROSUVASTATIN CALCIUM 20 MG PO TABS
10.0000 mg | ORAL_TABLET | Freq: Every day | ORAL | Status: DC
  Administered 2021-01-02 – 2021-01-13 (×12): 10 mg via ORAL
  Filled 2021-01-01 (×12): qty 2

## 2021-01-01 MED ORDER — FOLIC ACID 1 MG PO TABS
1.0000 mg | ORAL_TABLET | Freq: Every day | ORAL | Status: DC
  Administered 2021-01-02 – 2021-01-13 (×12): 1 mg via ORAL
  Filled 2021-01-01 (×12): qty 1

## 2021-01-01 MED ORDER — ENSURE ENLIVE PO LIQD
237.0000 mL | Freq: Three times a day (TID) | ORAL | Status: DC
  Administered 2021-01-02: 237 mL via ORAL
  Filled 2021-01-01: qty 237

## 2021-01-01 MED ORDER — ACETAMINOPHEN 325 MG PO TABS
650.0000 mg | ORAL_TABLET | Freq: Four times a day (QID) | ORAL | Status: DC | PRN
  Administered 2021-01-02 – 2021-01-22 (×7): 650 mg via ORAL
  Filled 2021-01-01 (×8): qty 2

## 2021-01-01 MED ORDER — ADULT MULTIVITAMIN W/MINERALS CH
1.0000 | ORAL_TABLET | Freq: Every day | ORAL | Status: DC
  Administered 2021-01-02 – 2021-01-13 (×12): 1 via ORAL
  Filled 2021-01-01 (×12): qty 1

## 2021-01-01 MED ORDER — ALBUTEROL SULFATE HFA 108 (90 BASE) MCG/ACT IN AERS: 2.0000 | INHALATION_SPRAY | Freq: Four times a day (QID) | RESPIRATORY_TRACT | Status: DC | PRN

## 2021-01-01 MED ORDER — FLUTICASONE FUROATE-VILANTEROL 200-25 MCG/INH IN AEPB
1.0000 | INHALATION_SPRAY | Freq: Every day | RESPIRATORY_TRACT | Status: DC
  Administered 2021-01-04 – 2021-01-22 (×14): 1 via RESPIRATORY_TRACT
  Filled 2021-01-01 (×3): qty 28

## 2021-01-01 MED ORDER — LACTATED RINGERS IV BOLUS
2000.0000 mL | Freq: Once | INTRAVENOUS | Status: AC
  Administered 2021-01-01: 2000 mL via INTRAVENOUS

## 2021-01-01 MED ORDER — NICOTINE 21 MG/24HR TD PT24
21.0000 mg | MEDICATED_PATCH | Freq: Every day | TRANSDERMAL | Status: DC
  Administered 2021-01-02 – 2021-01-15 (×15): 21 mg via TRANSDERMAL
  Filled 2021-01-01 (×15): qty 1
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
## 2021-01-01 NOTE — ED Notes (Addendum)
Pt cleaned of urinary incontinence. Repositioned.

## 2021-01-01 NOTE — ED Notes (Signed)
Pt in radiology at this time. 

## 2021-01-01 NOTE — ED Triage Notes (Signed)
Pt comes via [facility] from home for AMS for the past week, possible UTI, HR 130

## 2021-01-01 NOTE — ED Notes (Signed)
Sister Clavin Ruhlman [phone] would like an update when possible

## 2021-01-01 NOTE — ED Notes (Signed)
Attempted to call report x2

## 2021-01-01 NOTE — ED Notes (Signed)
Pt reports last ETOH was Monday. Pt reports that he drinks ETOH daily.
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
## 2021-01-01 NOTE — ED Notes (Signed)
Attempted to call report x 1  

## 2021-01-02 ENCOUNTER — Other Ambulatory Visit: Payer: Self-pay

## 2021-01-02 DIAGNOSIS — I472 Ventricular tachycardia: Secondary | ICD-10-CM | POA: Diagnosis not present

## 2021-01-02 DIAGNOSIS — Z681 Body mass index (BMI) 19 or less, adult: Secondary | ICD-10-CM | POA: Diagnosis not present

## 2021-01-02 DIAGNOSIS — Z8673 Personal history of transient ischemic attack (TIA), and cerebral infarction without residual deficits: Secondary | ICD-10-CM | POA: Diagnosis not present

## 2021-01-02 DIAGNOSIS — Z8616 Personal history of COVID-19: Secondary | ICD-10-CM | POA: Diagnosis not present

## 2021-01-02 DIAGNOSIS — I6200 Nontraumatic subdural hemorrhage, unspecified: Secondary | ICD-10-CM | POA: Diagnosis not present

## 2021-01-02 DIAGNOSIS — Z720 Tobacco use: Secondary | ICD-10-CM | POA: Diagnosis not present

## 2021-01-02 DIAGNOSIS — Z20822 Contact with and (suspected) exposure to covid-19: Secondary | ICD-10-CM | POA: Diagnosis not present

## 2021-01-02 DIAGNOSIS — N179 Acute kidney failure, unspecified: Secondary | ICD-10-CM | POA: Diagnosis not present

## 2021-01-02 DIAGNOSIS — I6201 Nontraumatic acute subdural hemorrhage: Secondary | ICD-10-CM | POA: Diagnosis not present

## 2021-01-02 DIAGNOSIS — R6510 Systemic inflammatory response syndrome (SIRS) of non-infectious origin without acute organ dysfunction: Secondary | ICD-10-CM | POA: Diagnosis not present

## 2021-01-02 DIAGNOSIS — I639 Cerebral infarction, unspecified: Secondary | ICD-10-CM | POA: Diagnosis not present

## 2021-01-02 DIAGNOSIS — F10231 Alcohol dependence with withdrawal delirium: Secondary | ICD-10-CM | POA: Diagnosis not present

## 2021-01-02 DIAGNOSIS — R509 Fever, unspecified: Secondary | ICD-10-CM | POA: Diagnosis not present

## 2021-01-02 DIAGNOSIS — G9340 Encephalopathy, unspecified: Secondary | ICD-10-CM | POA: Diagnosis not present

## 2021-01-02 DIAGNOSIS — G8191 Hemiplegia, unspecified affecting right dominant side: Secondary | ICD-10-CM | POA: Diagnosis present

## 2021-01-02 DIAGNOSIS — G319 Degenerative disease of nervous system, unspecified: Secondary | ICD-10-CM | POA: Diagnosis not present

## 2021-01-02 DIAGNOSIS — R5600 Simple febrile convulsions: Secondary | ICD-10-CM | POA: Diagnosis present

## 2021-01-02 DIAGNOSIS — G9341 Metabolic encephalopathy: Secondary | ICD-10-CM | POA: Diagnosis not present

## 2021-01-02 DIAGNOSIS — D689 Coagulation defect, unspecified: Secondary | ICD-10-CM | POA: Diagnosis present

## 2021-01-02 DIAGNOSIS — E43 Unspecified severe protein-calorie malnutrition: Secondary | ICD-10-CM | POA: Diagnosis not present

## 2021-01-02 DIAGNOSIS — Z89412 Acquired absence of left great toe: Secondary | ICD-10-CM | POA: Diagnosis not present

## 2021-01-02 DIAGNOSIS — R531 Weakness: Secondary | ICD-10-CM | POA: Diagnosis not present

## 2021-01-02 DIAGNOSIS — J3489 Other specified disorders of nose and nasal sinuses: Secondary | ICD-10-CM | POA: Diagnosis not present

## 2021-01-02 DIAGNOSIS — R4182 Altered mental status, unspecified: Secondary | ICD-10-CM | POA: Diagnosis not present

## 2021-01-02 DIAGNOSIS — S065X0A Traumatic subdural hemorrhage without loss of consciousness, initial encounter: Secondary | ICD-10-CM | POA: Diagnosis not present

## 2021-01-02 DIAGNOSIS — J329 Chronic sinusitis, unspecified: Secondary | ICD-10-CM | POA: Diagnosis not present

## 2021-01-02 DIAGNOSIS — R0602 Shortness of breath: Secondary | ICD-10-CM | POA: Diagnosis not present

## 2021-01-02 DIAGNOSIS — J986 Disorders of diaphragm: Secondary | ICD-10-CM | POA: Diagnosis not present

## 2021-01-02 DIAGNOSIS — J690 Pneumonitis due to inhalation of food and vomit: Secondary | ICD-10-CM | POA: Diagnosis not present

## 2021-01-02 DIAGNOSIS — I6782 Cerebral ischemia: Secondary | ICD-10-CM | POA: Diagnosis not present

## 2021-01-02 DIAGNOSIS — I739 Peripheral vascular disease, unspecified: Secondary | ICD-10-CM | POA: Diagnosis not present

## 2021-01-02 DIAGNOSIS — A419 Sepsis, unspecified organism: Secondary | ICD-10-CM | POA: Diagnosis not present

## 2021-01-02 DIAGNOSIS — Z4682 Encounter for fitting and adjustment of non-vascular catheter: Secondary | ICD-10-CM | POA: Diagnosis not present

## 2021-01-02 DIAGNOSIS — K6389 Other specified diseases of intestine: Secondary | ICD-10-CM | POA: Diagnosis not present

## 2021-01-02 DIAGNOSIS — E875 Hyperkalemia: Secondary | ICD-10-CM | POA: Diagnosis not present

## 2021-01-02 DIAGNOSIS — R000 Tachycardia, unspecified: Secondary | ICD-10-CM | POA: Diagnosis not present

## 2021-01-02 DIAGNOSIS — E871 Hypo-osmolality and hyponatremia: Secondary | ICD-10-CM | POA: Diagnosis not present

## 2021-01-02 DIAGNOSIS — G9389 Other specified disorders of brain: Secondary | ICD-10-CM | POA: Diagnosis not present

## 2021-01-02 DIAGNOSIS — I10 Essential (primary) hypertension: Secondary | ICD-10-CM | POA: Diagnosis not present

## 2021-01-02 DIAGNOSIS — I708 Atherosclerosis of other arteries: Secondary | ICD-10-CM | POA: Diagnosis not present

## 2021-01-02 DIAGNOSIS — Z89422 Acquired absence of other left toe(s): Secondary | ICD-10-CM | POA: Diagnosis not present

## 2021-01-02 DIAGNOSIS — N400 Enlarged prostate without lower urinary tract symptoms: Secondary | ICD-10-CM | POA: Diagnosis not present

## 2021-01-02 DIAGNOSIS — J9811 Atelectasis: Secondary | ICD-10-CM | POA: Diagnosis present

## 2021-01-02 DIAGNOSIS — F1010 Alcohol abuse, uncomplicated: Secondary | ICD-10-CM | POA: Diagnosis not present

## 2021-01-02 DIAGNOSIS — F17210 Nicotine dependence, cigarettes, uncomplicated: Secondary | ICD-10-CM | POA: Diagnosis present

## 2021-01-02 DIAGNOSIS — R569 Unspecified convulsions: Secondary | ICD-10-CM | POA: Diagnosis not present

## 2021-01-02 DIAGNOSIS — J90 Pleural effusion, not elsewhere classified: Secondary | ICD-10-CM | POA: Diagnosis not present

## 2021-01-02 DIAGNOSIS — I469 Cardiac arrest, cause unspecified: Secondary | ICD-10-CM | POA: Diagnosis not present

## 2021-01-02 DIAGNOSIS — S065X9A Traumatic subdural hemorrhage with loss of consciousness of unspecified duration, initial encounter: Secondary | ICD-10-CM | POA: Diagnosis not present

## 2021-01-02 DIAGNOSIS — I462 Cardiac arrest due to underlying cardiac condition: Secondary | ICD-10-CM | POA: Diagnosis not present

## 2021-01-02 DIAGNOSIS — R5381 Other malaise: Secondary | ICD-10-CM | POA: Diagnosis not present

## 2021-01-02 DIAGNOSIS — Z7289 Other problems related to lifestyle: Secondary | ICD-10-CM | POA: Diagnosis not present

## 2021-01-02 DIAGNOSIS — R059 Cough, unspecified: Secondary | ICD-10-CM | POA: Diagnosis not present

## 2021-01-02 DIAGNOSIS — F32A Depression, unspecified: Secondary | ICD-10-CM | POA: Diagnosis not present

## 2021-01-02 DIAGNOSIS — Z743 Need for continuous supervision: Secondary | ICD-10-CM | POA: Diagnosis not present

## 2021-01-02 DIAGNOSIS — E872 Acidosis: Secondary | ICD-10-CM | POA: Diagnosis not present

## 2021-01-02 DIAGNOSIS — E162 Hypoglycemia, unspecified: Secondary | ICD-10-CM | POA: Diagnosis not present

## 2021-01-02 DIAGNOSIS — E860 Dehydration: Secondary | ICD-10-CM | POA: Diagnosis present

## 2021-01-02 DIAGNOSIS — J449 Chronic obstructive pulmonary disease, unspecified: Secondary | ICD-10-CM | POA: Diagnosis not present

## 2021-01-02 LAB — IRON AND TIBC
Iron: 43 ug/dL — ABNORMAL LOW (ref 45–182)
Saturation Ratios: 36 % (ref 17.9–39.5)
TIBC: 119 ug/dL — ABNORMAL LOW (ref 250–450)
UIBC: 76 ug/dL

## 2021-01-02 LAB — COMPREHENSIVE METABOLIC PANEL
ALT: 16 U/L (ref 0–44)
AST: 40 U/L (ref 15–41)
Albumin: 2.8 g/dL — ABNORMAL LOW (ref 3.5–5.0)
Alkaline Phosphatase: 66 U/L (ref 38–126)
Anion gap: 18 — ABNORMAL HIGH (ref 5–15)
BUN: 13 mg/dL (ref 8–23)
CO2: 18 mmol/L — ABNORMAL LOW (ref 22–32)
Calcium: 8.3 mg/dL — ABNORMAL LOW (ref 8.9–10.3)
Chloride: 100 mmol/L (ref 98–111)
Creatinine, Ser: 1.55 mg/dL — ABNORMAL HIGH (ref 0.61–1.24)
GFR, Estimated: 50 mL/min — ABNORMAL LOW (ref >60)
Glucose, Bld: 82 mg/dL (ref 70–99)
Potassium: 4.1 mmol/L (ref 3.5–5.1)
Sodium: 136 mmol/L (ref 135–145)
Total Bilirubin: 2.6 mg/dL — ABNORMAL HIGH (ref 0.3–1.2)
Total Protein: 5.3 g/dL — ABNORMAL LOW (ref 6.5–8.1)

## 2021-01-02 LAB — TYPE AND SCREEN
ABO/RH(D): O POS
Antibody Screen: NEGATIVE

## 2021-01-02 LAB — FERRITIN: Ferritin: 842 ng/mL — ABNORMAL HIGH (ref 24–336)

## 2021-01-02 LAB — CBC
HCT: 21.4 % — ABNORMAL LOW (ref 39.0–52.0)
Hemoglobin: 7.2 g/dL — ABNORMAL LOW (ref 13.0–17.0)
MCH: 33.8 pg (ref 26.0–34.0)
MCHC: 33.6 g/dL (ref 30.0–36.0)
MCV: 100.5 fL — ABNORMAL HIGH (ref 80.0–100.0)
Platelets: 97 10*3/uL — ABNORMAL LOW (ref 150–400)
RBC: 2.13 MIL/uL — ABNORMAL LOW (ref 4.22–5.81)
RDW: 17.1 % — ABNORMAL HIGH (ref 11.5–15.5)
WBC: 5.5 10*3/uL (ref 4.0–10.5)
nRBC: 2.3 % — ABNORMAL HIGH (ref 0.0–0.2)

## 2021-01-02 LAB — FOLATE: Folate: 10.3 ng/mL (ref >5.9)

## 2021-01-02 LAB — RETICULOCYTES
Immature Retic Fract: 20.3 % — ABNORMAL HIGH (ref 2.3–15.9)
RBC.: 2.23 MIL/uL — ABNORMAL LOW (ref 4.22–5.81)
Retic Count, Absolute: 20.5 10*3/uL (ref 19.0–186.0)
Retic Ct Pct: 0.9 % (ref 0.4–3.1)

## 2021-01-02 LAB — ETHANOL: Alcohol, Ethyl (B): <10 mg/dL (ref <10)

## 2021-01-02 LAB — PHOSPHORUS: Phosphorus: 2.1 mg/dL — ABNORMAL LOW (ref 2.5–4.6)

## 2021-01-02 LAB — LACTIC ACID, PLASMA: Lactic Acid, Venous: 2.6 mmol/L (ref 0.5–1.9)

## 2021-01-02 LAB — VITAMIN B12: Vitamin B-12: 267 pg/mL (ref 180–914)

## 2021-01-02 LAB — PROCALCITONIN: Procalcitonin: 0.34 ng/mL

## 2021-01-02 LAB — MAGNESIUM: Magnesium: 1.4 mg/dL — ABNORMAL LOW (ref 1.7–2.4)

## 2021-01-02 MED ORDER — K PHOS MONO-SOD PHOS DI & MONO 155-852-130 MG PO TABS
500.0000 mg | ORAL_TABLET | Freq: Three times a day (TID) | ORAL | Status: AC
  Administered 2021-01-02 (×3): 500 mg via ORAL
  Filled 2021-01-02 (×3): qty 2

## 2021-01-02 MED ORDER — FERROUS SULFATE 325 (65 FE) MG PO TABS
325.0000 mg | ORAL_TABLET | Freq: Two times a day (BID) | ORAL | Status: DC
  Administered 2021-01-02 – 2021-01-13 (×21): 325 mg via ORAL
  Filled 2021-01-02 (×23): qty 1

## 2021-01-02 MED ORDER — VITAMIN B-12 1000 MCG PO TABS
1000.0000 ug | ORAL_TABLET | Freq: Every day | ORAL | Status: DC
  Administered 2021-01-06 – 2021-01-13 (×8): 1000 ug via ORAL
  Filled 2021-01-02 (×8): qty 1

## 2021-01-02 MED ORDER — DEXTROSE 50 % IV SOLN: 1.0000 | INTRAVENOUS | Status: DC | PRN

## 2021-01-02 MED ORDER — VITAMIN B-12 1000 MCG PO TABS
1000.0000 ug | ORAL_TABLET | Freq: Every day | ORAL | Status: DC
  Administered 2021-01-02: 1000 ug via ORAL
  Filled 2021-01-02: qty 1

## 2021-01-02 MED ORDER — MAGNESIUM SULFATE 2 GM/50ML IV SOLN
2.0000 g | Freq: Once | INTRAVENOUS | Status: AC
  Administered 2021-01-02: 2 g via INTRAVENOUS
  Filled 2021-01-02: qty 50

## 2021-01-02 MED ORDER — CYANOCOBALAMIN 1000 MCG/ML IJ SOLN
1000.0000 ug | Freq: Every day | INTRAMUSCULAR | Status: AC
  Administered 2021-01-02 – 2021-01-04 (×3): 1000 ug via SUBCUTANEOUS
  Filled 2021-01-02 (×3): qty 1

## 2021-01-02 NOTE — Progress Notes (Signed)
PROGRESS NOTE                                                                                                                                                                                                             Patient Demographics:    Seth [name], is a 65 y.o. male, DOB - 10-02-1955, WC:[id number]  Outpatient Primary MD for the patient is Lucianne Lei, MD    LOS - 0  Admit date - 01/01/2021    Chief Complaint  Patient presents with  . Altered Mental Status       Brief Narrative (HPI from H&P)  - Seth [name] is a 65 y.o. male with medical history significant for history of CVA, PVD s/p LLE angiography with DES placed to popliteal artery, left foot osteomyelitis s/p transmetatarsal hypertension 09/22/2020, COPD, hypertension, alcohol use, and tobacco use who presents to the ED for evaluation of altered mental status, in the ER his work-up was consistent with dehydration, hypoglycemia and possible SIRS.   Subjective:    Seth [name] today has, No headache, No chest pain, No abdominal pain - No Nausea, No new weakness tingling or numbness, no SOB   Assessment  & Plan :      1. Acute mental status decline - likely on by alcohol abuse, poor oral intake causing hypoglycemia, hypomagnesemia, dehydration, AKI and hyperkalemia.  He has been extensively counseled to quit alcohol intake, hydrate with IV fluids, replace magnesium, hyperkalemia has resolved, monitor CBGs closely, encourage oral intake, PT OT and monitor.      2.  SIRS.  Likely due to dehydration no source of infection, hydrate with IV fluids and monitor.  3.  History of CVA.  Supportive care continue dual antiplatelet therapy with statin.  4.  Macrocytic anemia.  Borderline B12 levels, placed on replacement, type screen and monitor.  5.  History of PAD s/p left lower extremity angiography with DES placement to popliteal artery, s/p left  transmetatarsal amputation in the past.  Supportive care.  Continue DAPT with statin.  6.  Alcohol abuse.  Counseled to quit last drink on 12/27/2020.  Should not go into withdrawal will monitor closely.  7.  COPD.  Stable on supportive care.  8.  Ongoing history of smoking.  Counseled to quit on NicoDerm patch.       Condition -  Extremely Guarded  Family Communication  : None present  Code Status :  Full  Consults  :  None  PUD Prophylaxis : PPI   Procedures  :     CT Head - Non Acute      Disposition Plan  :    Status is:   Dispo: The patient is from: Home              Anticipated d/c is to: Home              Patient currently is not medically stable to d/c.   Difficult to place patient No   DVT Prophylaxis  :    enoxaparin (LOVENOX) injection 40 mg Start: 01/02/21 1000     Lab Results  Component Value Date   PLT 97 (L) 01/02/2021    Diet :  Diet Order            Diet regular Room service appropriate? Yes; Fluid consistency: Thin  Diet effective now                  Inpatient Medications  Scheduled Meds: . aspirin  81 mg Oral Daily  . clopidogrel  75 mg Oral Q breakfast  . cyanocobalamin  1,000 mcg Subcutaneous Daily  . enoxaparin (LOVENOX) injection  40 mg Subcutaneous Daily  . feeding supplement  237 mL Oral TID BM  . ferrous sulfate  325 mg Oral BID WC  . finasteride  5 mg Oral Daily  . fluticasone furoate-vilanterol  1 puff Inhalation Daily  . folic acid  1 mg Oral Daily  . metoprolol tartrate  12.5 mg Oral BID  . multivitamin with minerals  1 tablet Oral Daily  . nicotine  21 mg Transdermal Daily  . phosphorus  500 mg Oral TID  . rosuvastatin  10 mg Oral Daily  . thiamine  100 mg Oral Daily  . [START ON 01/06/2021] vitamin B-12  1,000 mcg Oral Daily   Continuous Infusions: . lactated ringers 100 mL/hr at 01/02/21 0109   PRN Meds:.acetaminophen **OR** [DISCONTINUED] acetaminophen, albuterol, dextrose, guaiFENesin, [DISCONTINUED]  ondansetron **OR** ondansetron (ZOFRAN) IV  Antibiotics  :    Anti-infectives (From admission, onward)   Start     Dose/Rate Route Frequency Ordered Stop   01/01/21 2330  vancomycin (VANCOREADY) IVPB 1250 mg/250 mL        1,250 mg 166.7 mL/hr over 90 Minutes Intravenous  Once 01/01/21 2302 01/02/21 0301   01/01/21 2315  piperacillin-tazobactam (ZOSYN) IVPB 3.375 g        3.375 g 12.5 mL/hr over 240 Minutes Intravenous  Once 01/01/21 2302 01/02/21 0404       Time Spent in minutes  30   [name] M.D on 01/02/2021 at 11:40 AM  To page go to [email or web address]   [facility] -  Office  [phone]    See all Orders from today for further details    Objective:   Vitals:   01/02/21 0122 01/02/21 0500 01/02/21 1003 01/02/21 1100  BP: (!) 161/93 140/76 112/73 116/75  Pulse: (!) 113 94 97   Resp: 18 18 18    Temp: 99.6 F (37.6 C) 99.9 F (37.7 C) 98.6 F (37 C)   TempSrc: Oral Oral Oral   SpO2: 100% 100% 100%   Weight: 62.4 kg     Height: 6\' 3"  (1.905 m)       Wt Readings from Last 3 Encounters:  01/02/21 62.4 kg  10/04/20 67.1 kg  09/27/20  67.3 kg     Intake/Output Summary (Last 24 hours) at 01/02/2021 1140 Last data filed at 01/02/2021 0307 Gross per 24 hour  Intake 345.88 ml  Output --  Net 345.88 ml     Physical Exam  Awake Alert, No new F.N deficits, Normal affect [place].AT,PERRAL Supple Neck,No JVD, No cervical lymphadenopathy appriciated.  Symmetrical Chest wall movement, Good air movement bilaterally, CTAB RRR,No Gallops,Rubs or new Murmurs, No Parasternal Heave +ve B.Sounds, Abd Soft, No tenderness, No organomegaly appriciated, No rebound - guarding or rigidity. No Cyanosis, Clubbing or edema, old right transmetatarsal amputation    Data Review:    CBC Recent Labs  Lab 01/01/21 2124 01/02/21 0300  WBC 6.1 5.5  HGB 8.6* 7.2*  HCT 27.4* 21.4*  PLT 127* 97*  MCV 106.6* 100.5*  MCH 33.5 33.8  MCHC 31.4 33.6  RDW 17.9* 17.1*  LYMPHSABS  0.6*  --   MONOABS 0.6  --   EOSABS 0.0  --   BASOSABS 0.0  --     Recent Labs  Lab 01/01/21 2124 01/01/21 2134 01/01/21 2334 01/02/21 0300  NA 135  --   --  136  K 5.6*  --   --  4.1  CL 107  --   --  100  CO2 10*  --   --  18*  GLUCOSE 64*  --   --  82  BUN 14  --   --  13  CREATININE 1.51*  --   --  1.55*  CALCIUM 6.6*  --   --  8.3*  AST 46*  --   --  40  ALT 14  --   --  16  ALKPHOS 65  --   --  66  BILITOT 3.2*  --   --  2.6*  ALBUMIN 2.7*  --   --  2.8*  MG  --   --   --  1.4*  LATICACIDVEN  --  2.8* 2.6*  --   INR 0.9  --   --   --     ------------------------------------------------------------------------------------------------------------------ No results for input(s): CHOL, HDL, LDLCALC, TRIG, CHOLHDL, LDLDIRECT in the last 72 hours.  Lab Results  Component Value Date   HGBA1C 4.2 (L) 07/20/2020   ------------------------------------------------------------------------------------------------------------------ No results for input(s): TSH, T4TOTAL, T3FREE, THYROIDAB in the last 72 hours.  Invalid input(s): FREET3  Cardiac Enzymes No results for input(s): CKMB, TROPONINI, MYOGLOBIN in the last 168 hours.  Invalid input(s): CK ------------------------------------------------------------------------------------------------------------------    Component Value Date/Time   BNP 133.9 (H) 07/22/2020 0007    Micro Results Recent Results (from the past 240 hour(s))  Culture, blood (Routine x 2)     Status: None (Preliminary result)   Collection Time: 01/01/21 10:04 PM   Specimen: BLOOD RIGHT HAND  Result Value Ref Range Status   Specimen Description BLOOD RIGHT HAND  Final   Special Requests   Final    BOTTLES DRAWN AEROBIC ONLY Blood Culture results may not be optimal due to an inadequate volume of blood received in culture bottles   Culture   Final    NO GROWTH < 12 HOURS Performed at [facility], [place] [address] [place], [place] [address]     Report Status PENDING  Incomplete  Resp Panel by RT-PCR (Flu A&B, Covid) Nasopharyngeal Swab     Status: None   Collection Time: 01/01/21 10:09 PM   Specimen: Nasopharyngeal Swab; Nasopharyngeal(NP) swabs in vial transport medium  Result Value Ref Range Status  SARS Coronavirus 2 by RT PCR NEGATIVE NEGATIVE Final    Comment: (NOTE) SARS-CoV-2 target nucleic acids are NOT DETECTED.  The SARS-CoV-2 RNA is generally detectable in upper respiratory specimens during the acute phase of infection. The lowest concentration of SARS-CoV-2 viral copies this assay can detect is 138 copies/mL. A negative result does not preclude SARS-Cov-2 infection and should not be used as the sole basis for treatment or other patient management decisions. A negative result may occur with  improper specimen collection/handling, submission of specimen other than nasopharyngeal swab, presence of viral mutation(s) within the areas targeted by this assay, and inadequate number of viral copies(<138 copies/mL). A negative result must be combined with clinical observations, patient history, and epidemiological information. The expected result is Negative.  Fact Sheet for Patients:  EntrepreneurPulse.com.au  Fact Sheet for Healthcare Providers:  IncredibleEmployment.be  This test is no t yet approved or cleared by the [place] FDA and  has been authorized for detection and/or diagnosis of SARS-CoV-2 by FDA under an Emergency Use Authorization (EUA). This EUA will remain  in effect (meaning this test can be used) for the duration of the COVID-19 declaration under Section 564(b)(1) of the Act, 21 U.S.C.section 360bbb-3(b)(1), unless the authorization is terminated  or revoked sooner.       Influenza A by PCR NEGATIVE NEGATIVE Final   Influenza B by PCR NEGATIVE NEGATIVE Final    Comment: (NOTE) The Xpert Xpress SARS-CoV-2/FLU/RSV plus assay is intended as an aid in the  diagnosis of influenza from Nasopharyngeal swab specimens and should not be used as a sole basis for treatment. Nasal washings and aspirates are unacceptable for Xpert Xpress SARS-CoV-2/FLU/RSV testing.  Fact Sheet for Patients: EntrepreneurPulse.com.au  Fact Sheet for Healthcare Providers: IncredibleEmployment.be  This test is not yet approved or cleared by the [place] FDA and has been authorized for detection and/or diagnosis of SARS-CoV-2 by FDA under an Emergency Use Authorization (EUA). This EUA will remain in effect (meaning this test can be used) for the duration of the COVID-19 declaration under Section 564(b)(1) of the Act, 21 U.S.C. section 360bbb-3(b)(1), unless the authorization is terminated or revoked.  Performed at [facility], [place] [address] [place], [place] [address]     Radiology Reports DG Chest 2 View  Result Date: 01/01/2021 CLINICAL DATA:  Altered mental status and possible sepsis EXAM: CHEST - 2 VIEW COMPARISON:  09/19/2020 FINDINGS: The heart size and mediastinal contours are within normal limits. Both lungs are clear. The visualized skeletal structures are unremarkable. IMPRESSION: No active cardiopulmonary disease. Electronically Signed   By: Inez Catalina M.D.   On: 01/01/2021 21:44

## 2021-01-02 NOTE — Evaluation (Signed)
Physical Therapy Evaluation Patient Details Name: Seth [name] MRN: [id number] DOB: 08-22-1956 Today's Date: 01/02/2021   History of Present Illness  Seth [name] is a 65 y.o. male who presents with AMS, 1 fall, and vomiting. Admitted for SIRS, AKI. PMH: alcohol and tobacco abuse, covid 3 mos ago, CVA, PVD, L transmet amp, HTN, COPD  Clinical Impression  Pt admitted with above diagnosis. Pt presents with fatigue with all mobility, has trouble maintaining upright posture even in sitting. Pt with symptomatic orthostasis with sit to stand which limited his mobility today. Unsafe to d/c home alone in current state.  Pt currently with functional limitations due to the deficits listed below (see PT Problem List). Pt will benefit from skilled PT to increase their independence and safety with mobility to allow discharge to the venue listed below.   BP sitting 116/75    HR 103 bpm      Standing  106/59  HR 146 bpm  Pt endorsed dizziness.      Follow Up Recommendations SNF;Supervision/Assistance - 24 hour based on orthostatic hypotension today. Pt likely to refuse SNF in which case would recommend HHPT and daughter staying with him.     Equipment Recommendations  None recommended by PT    Recommendations for Other Services OT consult     Precautions / Restrictions Precautions Precautions: Fall Precaution Comments: orthostatic Restrictions Weight Bearing Restrictions: No      Mobility  Bed Mobility Overal bed mobility: Needs Assistance Bed Mobility: Supine to Sit     Supine to sit: Supervision     General bed mobility comments: pt takes increased time to get to [facility] but able to rise from flat bed without physical assist. Pt appearing fatigued in sitting EOB with cervical and lumbar flexion and inability to maintain extended posture for any length of time.    Transfers Overall transfer level: Needs assistance Equipment used: Rolling [place]man (2 wheeled) Transfers: Sit to/from  Stand Sit to Stand: Min assist;From elevated surface         General transfer comment: needed multiple vc's for hand placement, min A (25%) for power up from elevated bed, difficulty with fwd wt shift. Pt symptomatic with hypotension in standing. Needed min A with each sit to control descent  Ambulation/Gait Ambulation/Gait assistance: Min assist Gait Distance (Feet): 2 Feet Assistive device: Rolling [place] (2 wheeled) Gait Pattern/deviations: Trunk flexed;Shuffle;Decreased stride length Gait velocity: decreased Gait velocity interpretation: <1.31 ft/sec, indicative of household ambulator General Gait Details: pt needed assist moving RW as well as cues for directing RW and feet to chair.  Stairs            Wheelchair Mobility    Modified Rankin (Stroke Patients Only)       Balance Overall balance assessment: Needs assistance;History of Falls Sitting-balance support: Single extremity supported;Feet supported Sitting balance-[name] Scale: Fair Sitting balance - Comments: pt with posterior lean, cues needed several times to come fwd. Postural control: Posterior lean Standing balance support: Bilateral upper extremity supported;During functional activity Standing balance-[name] Scale: Poor Standing balance comment: required UE support and external assist                             Pertinent Vitals/Pain Pain Assessment: No/denies pain    Home Living Family/patient expects to be discharged to:: Private residence Living Arrangements: Alone Available Help at Discharge: Family;Available PRN/intermittently Type of Home: House Home Access: Level entry     Home Layout:  One level Home Equipment: [place] - 2 wheels;Cane - single point;Grab bars - tub/shower;Tub bench Additional Comments: daughter can stay with pt as needed    Prior Function Level of Independence: Independent with assistive device(s)         Comments: uses cane, family taks him to appts (does  not drive)     Hand Dominance   Dominant Hand: Right    Extremity/Trunk Assessment   Upper Extremity Assessment Upper Extremity Assessment: Generalized weakness    Lower Extremity Assessment Lower Extremity Assessment: Generalized weakness    Cervical / Trunk Assessment Cervical / Trunk Assessment: Kyphotic  Communication   Communication: No difficulties  Cognition Arousal/Alertness: Awake/alert Behavior During Therapy: Flat affect Overall Cognitive Status: No family/caregiver present to determine baseline cognitive functioning                                 General Comments: in tact for following commands and basic conversation, has difficulty giving details of recent history      General Comments General comments (skin integrity, edema, etc.): BP sitting 116/75 HR 103 bpm, standing 1 min 106/59 HR 146 bpm, pt dizzy. SPO2 98% on RA    Exercises     Assessment/Plan    PT Assessment Patient needs continued PT services  PT Problem List Decreased strength;Decreased activity tolerance;Decreased balance;Decreased mobility;Cardiopulmonary status limiting activity;Decreased cognition       PT Treatment Interventions DME instruction;Gait training;Functional mobility training;Therapeutic activities;Therapeutic exercise;Balance training;Neuromuscular re-education;Cognitive remediation;Patient/family education    PT Goals (Current goals can be found in the Care Plan section)  Acute Rehab PT Goals Patient Stated Goal: return home PT Goal Formulation: With patient Time For Goal Achievement: 01/16/21 Potential to Achieve Goals: Good    Frequency Min 3X/week   Barriers to discharge Decreased caregiver support lives alone    Co-evaluation               AM-PAC PT "6 Clicks" Mobility  Outcome Measure Help needed turning from your back to your side while in a flat bed without using bedrails?: None Help needed moving from lying on your back to sitting on  the side of a flat bed without using bedrails?: None Help needed moving to and from a bed to a chair (including a wheelchair)?: A Little Help needed standing up from a chair using your arms (e.g., wheelchair or bedside chair)?: A Little Help needed to walk in hospital room?: A Lot Help needed climbing 3-5 steps with a railing? : Total 6 Click Score: 17    End of Session Equipment Utilized During Treatment: Gait belt Activity Tolerance: Treatment limited secondary to medical complications (Comment) ([place]) Patient left: in chair;with call bell/phone within reach;with chair alarm set Nurse Communication: Mobility status;Other (comment) ([place]) PT Visit Diagnosis: Unsteadiness on feet (R26.81);Repeated falls (R29.6);Muscle weakness (generalized) (M62.81);Dizziness and giddiness (R42)    Time: 9485-4627 PT Time Calculation (min) (ACUTE ONLY): 40 min   Charges:   PT Evaluation $PT Eval Moderate Complexity: 1 Mod PT Treatments $Gait Training: 8-22 mins $Therapeutic Activity: 8-22 mins        Leighton Roach, PT  Acute Rehab Services  Pager [phone] Office [place] 01/02/2021, 11:30 AM

## 2021-01-03 ENCOUNTER — Inpatient Hospital Stay (HOSPITAL_COMMUNITY): Payer: Medicare Other

## 2021-01-03 DIAGNOSIS — R6510 Systemic inflammatory response syndrome (SIRS) of non-infectious origin without acute organ dysfunction: Secondary | ICD-10-CM | POA: Diagnosis not present

## 2021-01-03 LAB — CBC WITH DIFFERENTIAL/PLATELET
Abs Immature Granulocytes: 0.05 10*3/uL (ref 0.00–0.07)
Basophils Absolute: 0 10*3/uL (ref 0.0–0.1)
Basophils Relative: 0 %
Eosinophils Absolute: 0 10*3/uL (ref 0.0–0.5)
Eosinophils Relative: 0 %
HCT: 23.7 % — ABNORMAL LOW (ref 39.0–52.0)
Hemoglobin: 8.1 g/dL — ABNORMAL LOW (ref 13.0–17.0)
Immature Granulocytes: 1 %
Lymphocytes Relative: 18 %
Lymphs Abs: 1.2 10*3/uL (ref 0.7–4.0)
MCH: 34 pg (ref 26.0–34.0)
MCHC: 34.2 g/dL (ref 30.0–36.0)
MCV: 99.6 fL (ref 80.0–100.0)
Monocytes Absolute: 0.8 10*3/uL (ref 0.1–1.0)
Monocytes Relative: 13 %
Neutro Abs: 4.5 10*3/uL (ref 1.7–7.7)
Neutrophils Relative %: 68 %
Platelets: 93 10*3/uL — ABNORMAL LOW (ref 150–400)
RBC: 2.38 MIL/uL — ABNORMAL LOW (ref 4.22–5.81)
RDW: 17 % — ABNORMAL HIGH (ref 11.5–15.5)
WBC: 6.6 10*3/uL (ref 4.0–10.5)
nRBC: 1.1 % — ABNORMAL HIGH (ref 0.0–0.2)

## 2021-01-03 LAB — COMPREHENSIVE METABOLIC PANEL
ALT: 17 U/L (ref 0–44)
AST: 44 U/L — ABNORMAL HIGH (ref 15–41)
Albumin: 3 g/dL — ABNORMAL LOW (ref 3.5–5.0)
Alkaline Phosphatase: 68 U/L (ref 38–126)
Anion gap: 10 (ref 5–15)
BUN: 6 mg/dL — ABNORMAL LOW (ref 8–23)
CO2: 27 mmol/L (ref 22–32)
Calcium: 8.8 mg/dL — ABNORMAL LOW (ref 8.9–10.3)
Chloride: 98 mmol/L (ref 98–111)
Creatinine, Ser: 0.81 mg/dL (ref 0.61–1.24)
GFR, Estimated: >60 mL/min (ref >60)
Glucose, Bld: 120 mg/dL — ABNORMAL HIGH (ref 70–99)
Potassium: 2.9 mmol/L — ABNORMAL LOW (ref 3.5–5.1)
Sodium: 135 mmol/L (ref 135–145)
Total Bilirubin: 1 mg/dL (ref 0.3–1.2)
Total Protein: 5.6 g/dL — ABNORMAL LOW (ref 6.5–8.1)

## 2021-01-03 LAB — MAGNESIUM: Magnesium: 1.6 mg/dL — ABNORMAL LOW (ref 1.7–2.4)

## 2021-01-03 LAB — BRAIN NATRIURETIC PEPTIDE: B Natriuretic Peptide: 244.9 pg/mL — ABNORMAL HIGH (ref 0.0–100.0)

## 2021-01-03 LAB — PROCALCITONIN: Procalcitonin: 0.21 ng/mL

## 2021-01-03 LAB — PHOSPHORUS: Phosphorus: 1.3 mg/dL — ABNORMAL LOW (ref 2.5–4.6)

## 2021-01-03 IMAGING — CT CT HEAD W/O CM
4 series · 16 of 47 positions shown, 18 images · non-contrast
Comparison: [DATE]

CLINICAL DATA: Delirium

EXAM:
CT HEAD WITHOUT CONTRAST
TECHNIQUE: Contiguous axial images were obtained from the base of the skull
through the vertex without intravenous contrast.

[Series 3: head wo · axial · 0.44mm/px · z∈[+1252,+1372]mm · 7 of 34 slices shown, 9 images]
[im 5/34  brain]
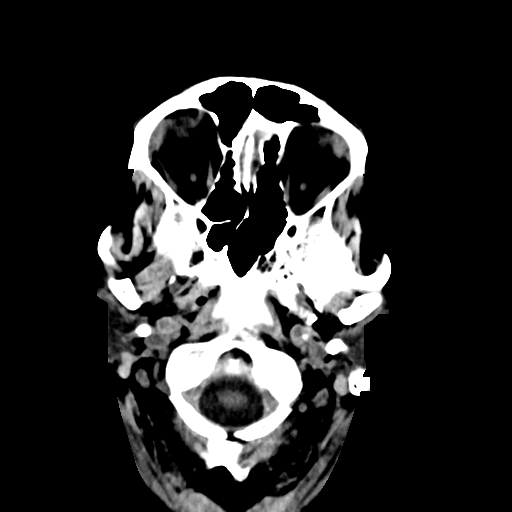
[im 5/34  bone]
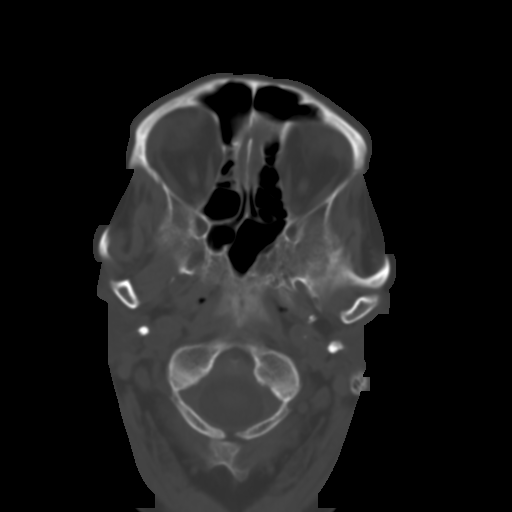
[im 9/34  brain]
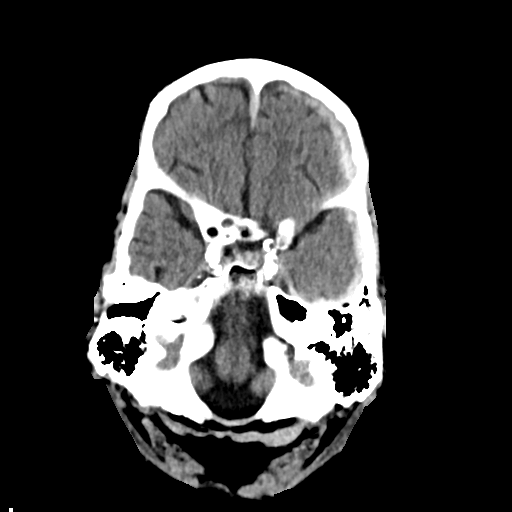
[im 13/34  brain]
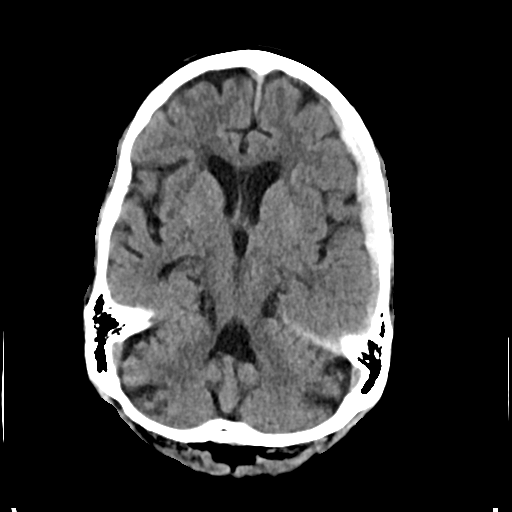
[im 17/34  brain]
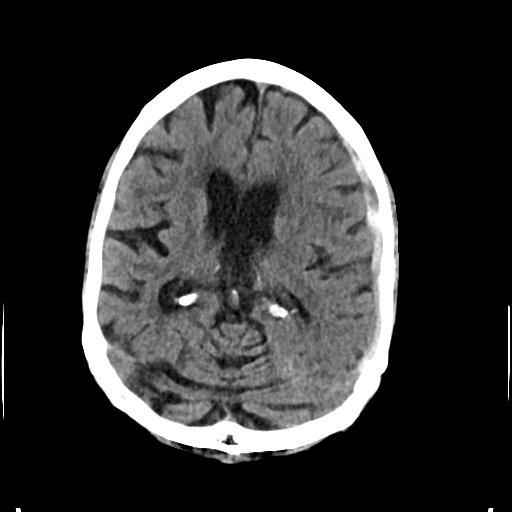
[im 21/34  brain]
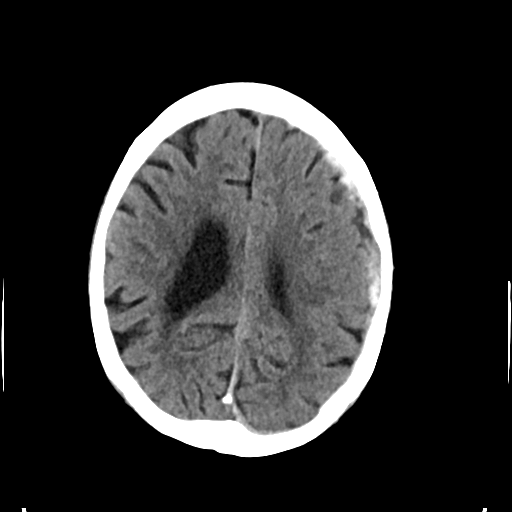
[im 21/34  bone]
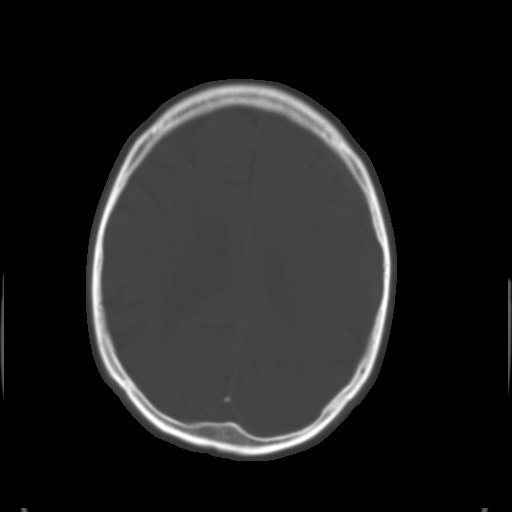
[im 25/34  brain]
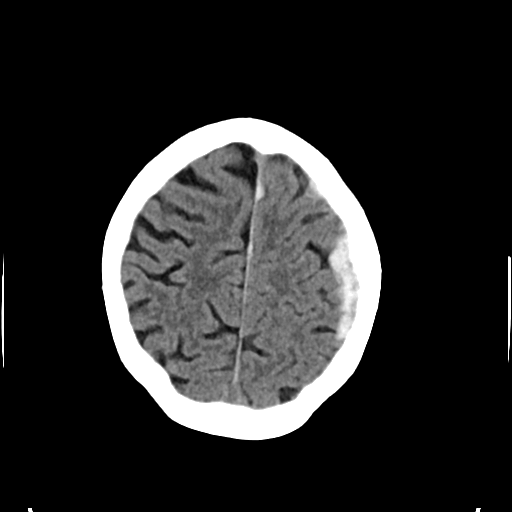
[im 29/34  brain]
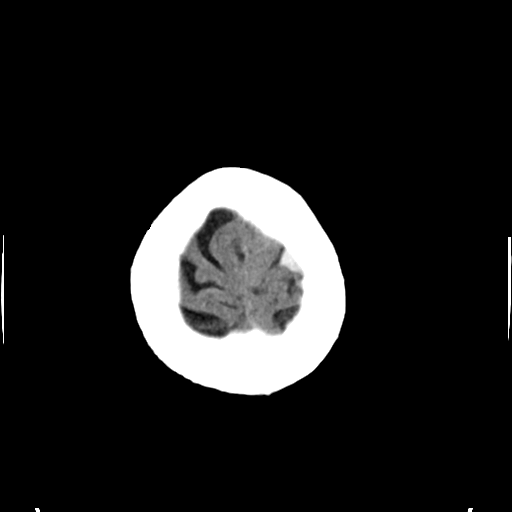

[Series 4: head bone · axial · 0.44mm/px · z∈[+1248,+1282]mm · 3 of 85 slices shown]
[im 9/85  bone]
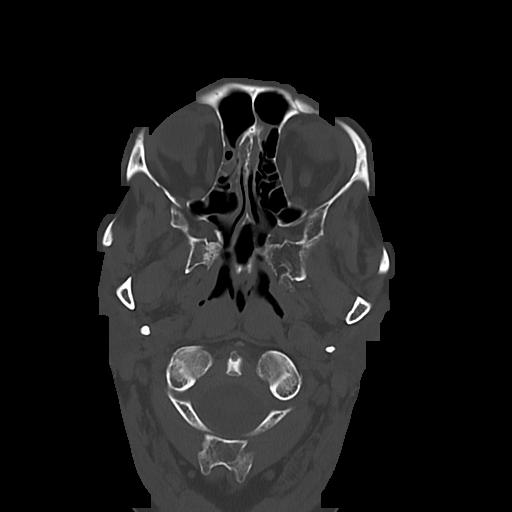
[im 17/85  bone]
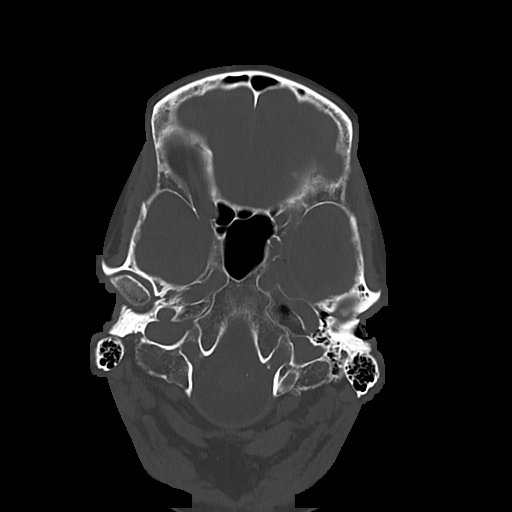
[im 26/85  bone]
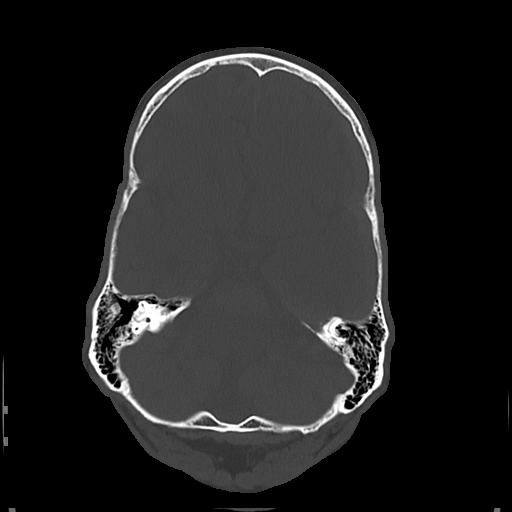

[Series 5: cor soft · coronal · 0.34mm/px · 3 of 65 slices shown]
[im 22/65  brain]
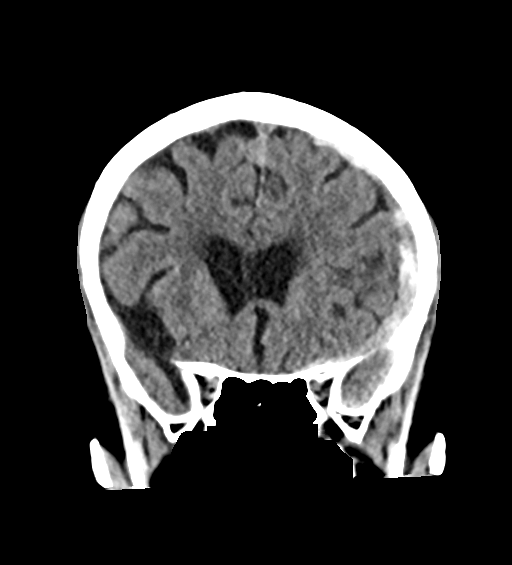
[im 29/65  brain]
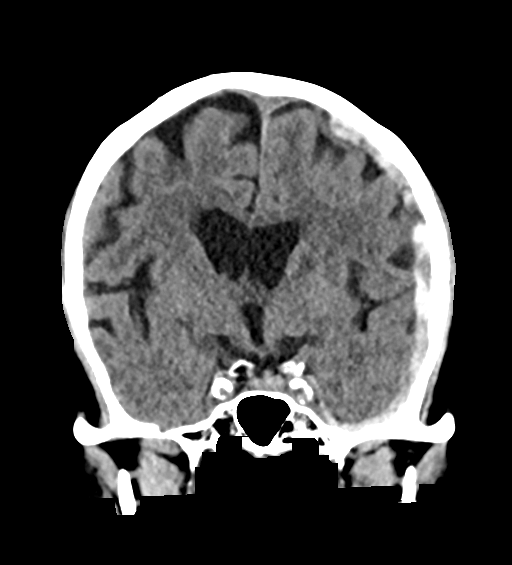
[im 36/65  brain]
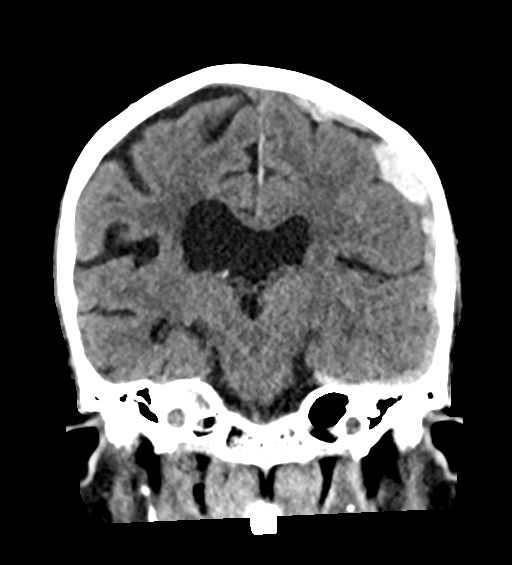

[Series 6: sag soft · sagittal · 0.39mm/px · 3 of 57 slices shown]
[im 19/57  brain]
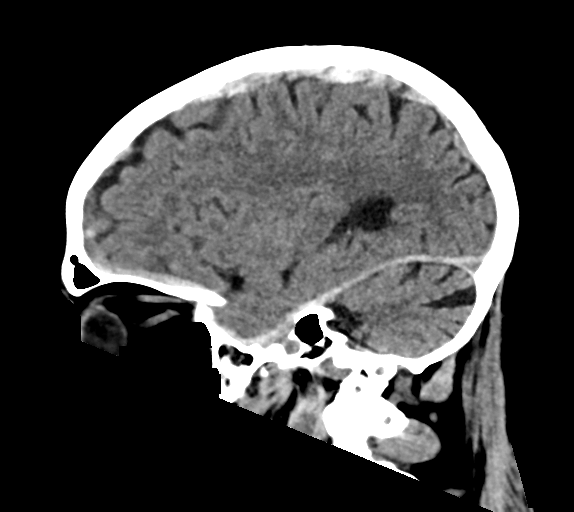
[im 29/57  brain]
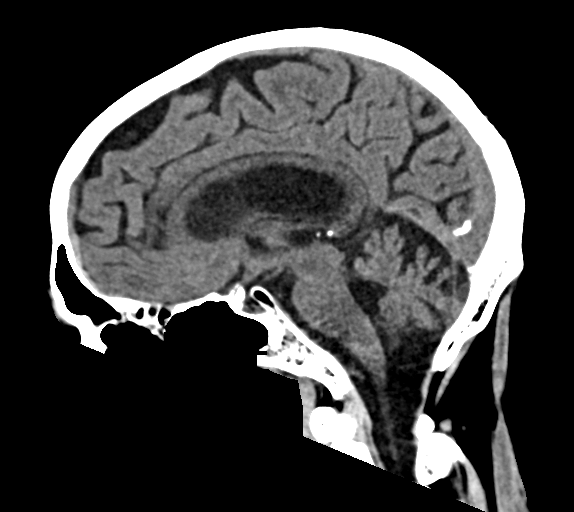
[im 38/57  brain]
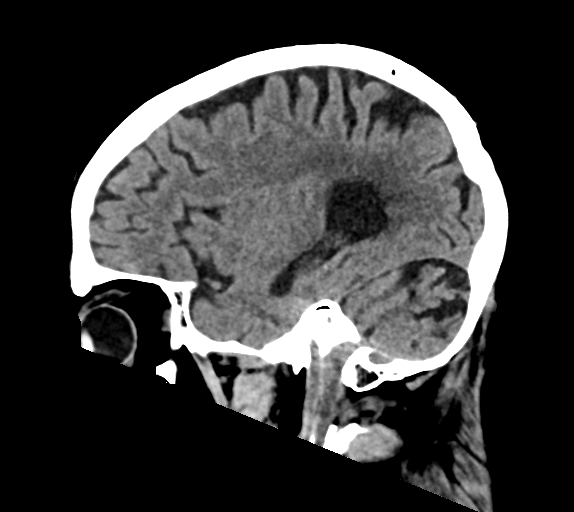

[16 of 47 positions shown; findings below may reference images not displayed]

FINDINGS: Brain: Hyperdense subdural hemorrhage is present along the left
cerebral convexity measuring up to 9 mm thickness. Small volume
hyperdense subdural hemorrhage is also present along the falx. Mass
effect is mild with 4 mm of rightward midline shift at the foramen
of VAIDEHI.

Stable 1 cm meningioma along the right aspect of the anterior falx.

Gray-white differentiation is preserved. Prominence of the
ventricles and sulci reflects generalized parenchymal volume loss.
Chronic bilateral cerebellar infarcts. Patchy hypoattenuation in the
supratentorial white matter is nonspecific but probably reflects
stable chronic microvascular ischemic changes.

Vascular: There is intracranial atherosclerotic calcification at the
skull base

Skull: Unremarkable.

Sinuses/Orbits: Patchy paranasal sinus mucosal thickening. No acute
orbital abnormality.

Other: Mastoid air cells are clear.
IMPRESSION: Acute left cerebral convexity subdural hemorrhage. Minimal
additional subdural hemorrhage along the falx. Mass effect is mild
with minor rightward midline shift.

Stable chronic findings detailed above.

These results were called by telephone at the time of interpretation
on [DATE] at [DATE] to provider VAIDEHI , who verbally
acknowledged these results.

## 2021-01-03 MED ORDER — POTASSIUM CHLORIDE CRYS ER 20 MEQ PO TBCR
40.0000 meq | EXTENDED_RELEASE_TABLET | Freq: Once | ORAL | Status: AC
  Administered 2021-01-03: 40 meq via ORAL
  Filled 2021-01-03: qty 2

## 2021-01-03 MED ORDER — LORAZEPAM 1 MG PO TABS
1.0000 mg | ORAL_TABLET | ORAL | Status: AC | PRN
  Administered 2021-01-05: 1 mg via ORAL
  Filled 2021-01-03: qty 1

## 2021-01-03 MED ORDER — BOOST / RESOURCE BREEZE PO LIQD CUSTOM
1.0000 | Freq: Three times a day (TID) | ORAL | Status: DC
  Administered 2021-01-05 – 2021-01-12 (×10): 1 via ORAL

## 2021-01-03 MED ORDER — POTASSIUM CHLORIDE CRYS ER 20 MEQ PO TBCR
20.0000 meq | EXTENDED_RELEASE_TABLET | Freq: Once | ORAL | Status: AC
  Administered 2021-01-03: 20 meq via ORAL
  Filled 2021-01-03: qty 1

## 2021-01-03 MED ORDER — METOPROLOL TARTRATE 25 MG PO TABS
25.0000 mg | ORAL_TABLET | Freq: Two times a day (BID) | ORAL | Status: DC
  Administered 2021-01-03 – 2021-01-04 (×4): 25 mg via ORAL
  Filled 2021-01-03 (×4): qty 1

## 2021-01-03 MED ORDER — K PHOS MONO-SOD PHOS DI & MONO 155-852-130 MG PO TABS
500.0000 mg | ORAL_TABLET | Freq: Four times a day (QID) | ORAL | Status: AC
  Administered 2021-01-03 (×3): 500 mg via ORAL
  Filled 2021-01-03 (×3): qty 2

## 2021-01-03 MED ORDER — MAGNESIUM SULFATE 2 GM/50ML IV SOLN
2.0000 g | Freq: Once | INTRAVENOUS | Status: AC
  Administered 2021-01-03: 2 g via INTRAVENOUS
  Filled 2021-01-03: qty 50

## 2021-01-03 MED ORDER — CLONIDINE HCL 0.1 MG PO TABS
0.1000 mg | ORAL_TABLET | Freq: Four times a day (QID) | ORAL | Status: DC | PRN
  Administered 2021-01-05: 0.1 mg via ORAL
  Filled 2021-01-03: qty 1

## 2021-01-03 MED ORDER — LORAZEPAM 2 MG/ML IJ SOLN: 1.0000 mg | INTRAMUSCULAR | Status: AC | PRN

## 2021-01-03 MED ORDER — CLOPIDOGREL BISULFATE 75 MG PO TABS: 75.0000 mg | ORAL_TABLET | Freq: Every day | ORAL | Status: DC

## 2021-01-03 NOTE — Progress Notes (Signed)
Initial Nutrition Assessment  DOCUMENTATION CODES:   Underweight  INTERVENTION:   D/c Ensure (pt refusing)  Boost Breeze po TID, each supplement provides 250 kcal and 9 grams of protein  Magic cup TID with meals, each supplement provides 290 kcal and 9 grams of protein  Continue MVI with minerals daily   NUTRITION DIAGNOSIS:   Underweight related to chronic illness,poor appetite as evidenced by per patient/family report,percent weight loss,other (comment) (BMI <18.5).  GOAL:   Patient will meet greater than or equal to 90% of their needs  MONITOR:   PO intake,Supplement acceptance,Labs,Weight trends,I & O's  REASON FOR ASSESSMENT:   Malnutrition Screening Tool    ASSESSMENT:   Pt admitted with AMS, dehydration, hypoglycemia, and SIRS and later found to have SDH. PMH includes CVA, PAD s/p LLE angiography with DES placed to popliteal artery, L foot osteomyelitis s/p transmetatarsal amputation, HTN, COPD, EtOH and tobacco use.  Pt unavailable at time of RD visit x2 attempts. Note pt likely unable to provide detailed history at this time as MD noted pt more confused today and possibly going into early DTs. Pt on CIWA protocol. Per H&P, pt endorsed consumption of >1 pint of vodka daily (reports no EtOH since 5/2) in addition to poor appetite/intake and 1 day history of emesis PTA.    PO intake: 100% x 1 recorded meal Pt with orders for Ensure TID but has been refusing per RN. Will trial other supplements.  Pt is underweight as BMI is <18.5. Additionally, per wt readings, pt has experienced >10% wt loss x5 months, which is significant for time frame. Suspect pt meets criteria for malnutrition; however, unable to diagnose at this time without nutrition-focused physical exam. Will attempt at follow-up.   UOP: 1510ml today   Medications: Vitamin B12, ferrous sulfate, folvite, mvi with minerals, thiamine Labs: K+ 2.9  (L), PO4 1.3 (L), Mg 1.6 (L)  Diet Order:   Diet Order             Diet regular Room service appropriate? Yes; Fluid consistency: Thin  Diet effective now                 EDUCATION NEEDS:   No education needs have been identified at this time  Skin:  Skin Assessment: Reviewed RN Assessment  Last BM:  5/6  Height:   Ht Readings from Last 1 Encounters:  01/02/21 6\' 3"  (1.905 m)    Weight:   Wt Readings from Last 1 Encounters:  01/02/21 62.4 kg    BMI:  Body mass index is 17.19 kg/m.  Estimated Nutritional Needs:   Kcal:  2000-2200  Protein:  100-115 grams  Fluid:  >2L/d    Larkin Ina, MS, RD, LDN RD pager number and weekend/on-call pager number located in [place].

## 2021-01-03 NOTE — Progress Notes (Addendum)
PROGRESS NOTE                                                                                                                                                                                                             Patient Demographics:    Seth [name], is a 65 y.o. male, DOB - 11/21/1955, WC:[id number]  Outpatient Primary MD for the patient is Lucianne Lei, MD    LOS - 1  Admit date - 01/01/2021    Chief Complaint  Patient presents with  . Altered Mental Status       Brief Narrative (HPI from H&P)  - Seth [name] is a 65 y.o. male with medical history significant for history of CVA, PVD s/p LLE angiography with DES placed to popliteal artery, left foot osteomyelitis s/p transmetatarsal hypertension 09/22/2020, COPD, hypertension, alcohol use, and tobacco use who presents to the ED for evaluation of altered mental status, in the ER his work-up was consistent with dehydration, hypoglycemia and possible SIRS.   Subjective:    Patient in bed, appears comfortable, denies any headache, no fever, no chest pain or pressure, no shortness of breath , no abdominal pain. No new focal weakness.   Assessment  & Plan :     1. Acute mental status decline - likely on by alcohol abuse, poor oral intake causing hypoglycemia, hypomagnesemia, dehydration, AKI and hyperkalemia, with possible DTs.  He has been extensively counseled to quit alcohol intake, hydrate with IV fluids, replace magnesium, hyperkalemia has resolved, monitor CBGs closely, encourage oral intake, will get a baseline CT head as well,  PT OT and monitor.     Addendum - CT head showing SDH, DW Dr Saintclair Halsted - repeat CT in 24hrs, hold Plavix, DW VVS Dr Donzetta Matters, ok for Plavix hold.   2.  SIRS.  Likely due to dehydration no source of infection, hydrate with IV fluids and monitor.  3.  History of CVA.  Supportive care continue dual antiplatelet therapy with statin.  4.   Macrocytic anemia.  Borderline B12 levels, placed on replacement, type screen and monitor.  5.  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  Supportive care.  Continue DAPT with statin.  6.  Alcohol abuse.  Counseled to quit last drink on 12/27/2020 ?,  He is slightly more confused on 01/03/2021 and could be going into early DTs, will monitor on CIWA protocol.  7.  COPD.  Stable on supportive care.  8.  Ongoing history of smoking.  Counseled to quit on NicoDerm patch.       Condition - Extremely Guarded  Family Communication  : Sister [phone] Seth [name] 01/03/21  Code Status :  Full  Consults  :  None  PUD Prophylaxis : PPI   Procedures  :     CT Head - Non Acute      Disposition Plan  :    Status is:   Dispo: The patient is from: Home              Anticipated d/c is to: Home              Patient currently is not medically stable to d/c.   Difficult to place patient No   DVT Prophylaxis  :    Place TED hose Start: 01/03/21 0658 enoxaparin (LOVENOX) injection 40 mg Start: 01/02/21 1000    Lab Results  Component Value Date   PLT 93 (L) 01/03/2021    Diet :  Diet Order            Diet regular Room service appropriate? Yes; Fluid consistency: Thin  Diet effective now                  Inpatient Medications  Scheduled Meds: . aspirin  81 mg Oral Daily  . clopidogrel  75 mg Oral Q breakfast  . cyanocobalamin  1,000 mcg Subcutaneous Daily  . enoxaparin (LOVENOX) injection  40 mg Subcutaneous Daily  . feeding supplement  237 mL Oral TID BM  . ferrous sulfate  325 mg Oral BID WC  . finasteride  5 mg Oral Daily  . fluticasone furoate-vilanterol  1 puff Inhalation Daily  . folic acid  1 mg Oral Daily  . metoprolol tartrate  12.5 mg Oral BID  . multivitamin with minerals  1 tablet Oral Daily  . nicotine  21 mg Transdermal Daily  . phosphorus  500 mg Oral QID  .  potassium chloride  20 mEq Oral Once  . potassium chloride  40 mEq Oral Once  . rosuvastatin  10 mg Oral Daily  . thiamine  100 mg Oral Daily  . [START ON 01/06/2021] vitamin B-12  1,000 mcg Oral Daily   Continuous Infusions: . magnesium sulfate bolus IVPB    . magnesium sulfate bolus IVPB     PRN Meds:.acetaminophen **OR** [DISCONTINUED] acetaminophen, albuterol, dextrose, guaiFENesin, LORazepam **OR** LORazepam, [DISCONTINUED] ondansetron **OR** ondansetron (ZOFRAN) IV  Antibiotics  :    Anti-infectives (From admission, onward)   Start     Dose/Rate Route Frequency Ordered Stop   01/01/21 2330  vancomycin (VANCOREADY) IVPB 1250 mg/250 mL        1,250 mg 166.7 mL/hr over 90 Minutes Intravenous  Once 01/01/21 2302 01/02/21 0301   01/01/21 2315  piperacillin-tazobactam (ZOSYN) IVPB 3.375 g        3.375 g 12.5 mL/hr over 240 Minutes Intravenous  Once 01/01/21 2302 01/02/21 0404       Time Spent in minutes  30   [name] M.D on 01/03/2021 at 8:43 AM  To page go to [email or web address]   [facility] -  Office  [phone]    See all Orders from today for further details    Objective:   Vitals:   01/02/21 1003 01/02/21 1100 01/02/21  1339 01/02/21 2004  BP: 112/73 116/75 105/63 (!) 148/86  Pulse: 97  96 (!) 106  Resp: 18  18 20   Temp: 98.6 F (37 C)  98.5 F (36.9 C) 99.6 F (37.6 C)  TempSrc: Oral  Oral Oral  SpO2: 100%  100% 100%  Weight:      Height:        Wt Readings from Last 3 Encounters:  01/02/21 62.4 kg  10/04/20 67.1 kg  09/27/20 67.3 kg     Intake/Output Summary (Last 24 hours) at 01/03/2021 0843 Last data filed at 01/02/2021 1625 Gross per 24 hour  Intake 240 ml  Output 400 ml  Net -160 ml     Physical Exam  Awake Alert, No new F.N deficits, Normal affect [place].AT,PERRAL Supple Neck,No JVD, No cervical lymphadenopathy appriciated.  Symmetrical Chest wall movement, Good air movement bilaterally, CTAB RRR,No Gallops, Rubs or new Murmurs,  No Parasternal Heave +ve B.Sounds, Abd Soft, No tenderness, No organomegaly appriciated, No rebound - guarding or rigidity. 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    Data Review:    CBC Recent Labs  Lab 01/01/21 2124 01/02/21 0300 01/03/21 0405  WBC 6.1 5.5 6.6  HGB 8.6* 7.2* 8.1*  HCT 27.4* 21.4* 23.7*  PLT 127* 97* 93*  MCV 106.6* 100.5* 99.6  MCH 33.5 33.8 34.0  MCHC 31.4 33.6 34.2  RDW 17.9* 17.1* 17.0*  LYMPHSABS 0.6*  --  1.2  MONOABS 0.6  --  0.8  EOSABS 0.0  --  0.0  BASOSABS 0.0  --  0.0    Recent Labs  Lab 01/01/21 2124 01/01/21 2134 01/01/21 2334 01/02/21 0300 01/02/21 1226 01/03/21 0405  NA 135  --   --  136  --  135  K 5.6*  --   --  4.1  --  2.9*  CL 107  --   --  100  --  98  CO2 10*  --   --  18*  --  27  GLUCOSE 64*  --   --  82  --  120*  BUN 14  --   --  13  --  6*  CREATININE 1.51*  --   --  1.55*  --  0.81  CALCIUM 6.6*  --   --  8.3*  --  8.8*  AST 46*  --   --  40  --  44*  ALT 14  --   --  16  --  17  ALKPHOS 65  --   --  66  --  68  BILITOT 3.2*  --   --  2.6*  --  1.0  ALBUMIN 2.7*  --   --  2.8*  --  3.0*  MG  --   --   --  1.4*  --  1.6*  PROCALCITON  --   --   --   --  0.34 0.21  LATICACIDVEN  --  2.8* 2.6*  --   --   --   INR 0.9  --   --   --   --   --   BNP  --   --   --   --   --  244.9*    ------------------------------------------------------------------------------------------------------------------ No results for input(s): CHOL, HDL, LDLCALC, TRIG, CHOLHDL, LDLDIRECT in the last 72 hours.  Lab Results  Component Value Date   HGBA1C 4.2 (L) 07/20/2020   ------------------------------------------------------------------------------------------------------------------ No results for input(s): TSH, T4TOTAL, T3FREE, THYROIDAB in the last 72  hours.  Invalid input(s): FREET3  Cardiac Enzymes No results for input(s): CKMB, TROPONINI, MYOGLOBIN in the last 168 hours.  Invalid input(s):  CK ------------------------------------------------------------------------------------------------------------------    Component Value Date/Time   BNP 244.9 (H) 01/03/2021 0405    Micro Results Recent Results (from the past 240 hour(s))  Culture, blood (Routine x 2)     Status: None (Preliminary result)   Collection Time: 01/01/21 10:04 PM   Specimen: BLOOD RIGHT HAND  Result Value Ref Range Status   Specimen Description BLOOD RIGHT HAND  Final   Special Requests   Final    BOTTLES DRAWN AEROBIC ONLY Blood Culture results may not be optimal due to an inadequate volume of blood received in culture bottles   Culture   Final    NO GROWTH 2 DAYS Performed at [facility], [place] [address] [place], [place] [address]    Report Status PENDING  Incomplete  Resp Panel by RT-PCR (Flu A&B, Covid) Nasopharyngeal Swab     Status: None   Collection Time: 01/01/21 10:09 PM   Specimen: Nasopharyngeal Swab; Nasopharyngeal(NP) swabs in vial transport medium  Result Value Ref Range Status   SARS Coronavirus 2 by RT PCR NEGATIVE NEGATIVE Final    Comment: (NOTE) SARS-CoV-2 target nucleic acids are NOT DETECTED.  The SARS-CoV-2 RNA is generally detectable in upper respiratory specimens during the acute phase of infection. The lowest concentration of SARS-CoV-2 viral copies this assay can detect is 138 copies/mL. A negative result does not preclude SARS-Cov-2 infection and should not be used as the sole basis for treatment or other patient management decisions. A negative result may occur with  improper specimen collection/handling, submission of specimen other than nasopharyngeal swab, presence of viral mutation(s) within the areas targeted by this assay, and inadequate number of viral copies(<138 copies/mL). A negative result must be combined with clinical observations, patient history, and epidemiological information. The expected result is Negative.  Fact Sheet for Patients:   EntrepreneurPulse.com.au  Fact Sheet for Healthcare Providers:  IncredibleEmployment.be  This test is no t yet approved or cleared by the [place] FDA and  has been authorized for detection and/or diagnosis of SARS-CoV-2 by FDA under an Emergency Use Authorization (EUA). This EUA will remain  in effect (meaning this test can be used) for the duration of the COVID-19 declaration under Section 564(b)(1) of the Act, 21 U.S.C.section 360bbb-3(b)(1), unless the authorization is terminated  or revoked sooner.       Influenza A by PCR NEGATIVE NEGATIVE Final   Influenza B by PCR NEGATIVE NEGATIVE Final    Comment: (NOTE) The Xpert Xpress SARS-CoV-2/FLU/RSV plus assay is intended as an aid in the diagnosis of influenza from Nasopharyngeal swab specimens and should not be used as a sole basis for treatment. Nasal washings and aspirates are unacceptable for Xpert Xpress SARS-CoV-2/FLU/RSV testing.  Fact Sheet for Patients: EntrepreneurPulse.com.au  Fact Sheet for Healthcare Providers: IncredibleEmployment.be  This test is not yet approved or cleared by the [place] FDA and has been authorized for detection and/or diagnosis of SARS-CoV-2 by FDA under an Emergency Use Authorization (EUA). This EUA will remain in effect (meaning this test can be used) for the duration of the COVID-19 declaration under Section 564(b)(1) of the Act, 21 U.S.C. section 360bbb-3(b)(1), unless the authorization is terminated or revoked.  Performed at [facility], [place] [address] [place], [place] [address]   Culture, blood (Routine x 2)     Status: None (Preliminary result)  Collection Time: 01/01/21 11:34 PM   Specimen: BLOOD LEFT ARM  Result Value Ref Range Status   Specimen Description BLOOD LEFT ARM  Final   Special Requests   Final    BOTTLES DRAWN AEROBIC AND ANAEROBIC Blood Culture adequate volume   Culture    Final    NO GROWTH 1 DAY Performed at [facility], [address], [place], [place] [address]    Report Status PENDING  Incomplete    Radiology Reports  DG Chest 2 View  Result Date: 01/01/2021 CLINICAL DATA:  Altered mental status and possible sepsis EXAM: CHEST - 2 VIEW COMPARISON:  09/19/2020 FINDINGS: The heart size and mediastinal contours are within normal limits. Both lungs are clear. The visualized skeletal structures are unremarkable. IMPRESSION: No active cardiopulmonary disease. Electronically Signed   By: Inez Catalina M.D.   On: 01/01/2021 21:44

## 2021-01-03 NOTE — Evaluation (Signed)
Occupational Therapy Evaluation Patient Details Name: Seth [name] MRN: [id number] DOB: March 20, 1956 Today's Date: 01/03/2021    History of Present Illness Seth [name] is a 65 y.o. male who presents with AMS, 1 fall, and vomiting. Admitted for SIRS, AKI. PMH: alcohol and tobacco abuse, covid 3 mos ago, CVA, PVD, L transmet amp, HTN, COPD   Clinical Impression   PTA patient was living alone in a private residence and was grossly Mod I with ADLs/IADLs with use of SPC. Patient currently functioning below baseline demonstrating observed ADLs including toileting/hygiene/clothing management and grooming standing at sink level with Min guard to Min A. Patient also limited by deficits listed below including generalized weakness and decreased activity tolerance and would benefit from continued acute OT services in prep for safe d/c to next level of care with recommendation for SNF rehab. Patient in agreement with POC.      Follow Up Recommendations  SNF;Supervision/Assistance - 24 hour    Equipment Recommendations  Other (comment) (Defer to next level of care.)    Recommendations for Other Services       Precautions / Restrictions Precautions Precautions: Fall Precaution Comments: orthostatic Restrictions Weight Bearing Restrictions: No      Mobility Bed Mobility Overal bed mobility: Needs Assistance             General bed mobility comments: Patient seated in recliner upon entry.    Transfers Overall transfer level: Needs assistance Equipment used: Rolling Merten (2 wheeled) Transfers: Sit to/from Stand Sit to Stand: Min assist;From elevated surface         General transfer comment: Min A for sit to stand from low recliner and from [facility] over standard toilet.    Balance Overall balance assessment: Needs assistance;History of Falls Sitting-balance support: Single extremity supported;Feet supported Sitting balance-[name] Scale: Fair Sitting balance - Comments: pt with  posterior lean, cues needed several times to come fwd. Postural control: Posterior lean Standing balance support: Bilateral upper extremity supported;During functional activity Standing balance-[name] Scale: Poor Standing balance comment: Requires BUE support to maintain dynamic standing balance.                           ADL either performed or assessed with clinical judgement   ADL Overall ADL's : Needs assistance/impaired     Grooming: Min guard;Standing Grooming Details (indicate cue type and reason): Hand washing standing at sink level with Min guard for safety and RW.                 Toilet Transfer: Designer, television/film set Details (indicate cue type and reason): Min guard for balance/safety and cues for Caras management. Toileting- Water quality scientist and Hygiene: Min guard;Sit to/from stand Toileting - Clothing Manipulation Details (indicate cue type and reason): Min guard for balance/safety for hygiene/clothing management in standing.     Functional mobility during ADLs: Rolling Sachse;Cueing for safety;Min guard General ADL Comments: Patient limited by generalized weakness, dereased activity tolerance and decreased standing balance requiring Min guard to Min A grossly with use of RW.     Vision Baseline Vision/History: Wears glasses Wears Glasses: At all times Patient Visual Report: No change from baseline       Perception     Praxis      Pertinent Vitals/Pain       Hand Dominance Right   Extremity/Trunk Assessment Upper Extremity Assessment Upper Extremity Assessment: Generalized weakness   Lower Extremity Assessment Lower Extremity Assessment: Generalized weakness  Cervical / Trunk Assessment Cervical / Trunk Assessment: Kyphotic   Communication Communication Communication: No difficulties   Cognition Arousal/Alertness: Awake/alert Behavior During Therapy: Flat affect Overall Cognitive Status: No family/caregiver present to  determine baseline cognitive functioning                                 General Comments: in tact for following commands and basic conversation, has difficulty giving details of recent history   General Comments       Exercises     Shoulder Instructions      Home Living Family/patient expects to be discharged to:: Private residence Living Arrangements: Alone Available Help at Discharge: Family;Available PRN/intermittently Type of Home: House Home Access: Level entry     Home Layout: One level     Bathroom Shower/Tub: Teacher, early years/pre: Standard Bathroom Accessibility: Yes   Home Equipment: Environmental consultant - 2 wheels;Cane - single point;Grab bars - tub/shower;Tub bench   Additional Comments: daughter can stay with pt as needed      Prior Functioning/Environment Level of Independence: Independent with assistive device(s)        Comments: uses cane, family taks him to appts (does not drive). Could make small meals for himself. Reports ordering take-out recently 2/2 functional decline.        OT Problem List: Decreased strength;Decreased activity tolerance;Impaired balance (sitting and/or standing);Decreased safety awareness;Decreased knowledge of use of DME or AE      OT Treatment/Interventions: Self-care/ADL training;Therapeutic exercise;Energy conservation;DME and/or AE instruction;Therapeutic activities;Patient/family education;Balance training    OT Goals(Current goals can be found in the care plan section) Acute Rehab OT Goals Patient Stated Goal: In agreement with rehab. OT Goal Formulation: With patient Time For Goal Achievement: 01/17/21 Potential to Achieve Goals: Good ADL Goals Pt Will Perform Grooming: standing;with modified independence Pt Will Perform Upper Body Dressing: with modified independence;sitting Pt Will Perform Lower Body Dressing: with supervision;sit to/from stand Pt Will Transfer to Toilet: with  supervision;ambulating Pt Will Perform Toileting - Clothing Manipulation and hygiene: with supervision;sit to/from stand Additional ADL Goal #1: Patient will recall 3 energy consrvation techniques in prep for completion of ADLs.  OT Frequency: Min 2X/week   Barriers to D/C: Decreased caregiver support;Inaccessible home environment  Lives alone.       Co-evaluation              AM-PAC OT "6 Clicks" Daily Activity     Outcome Measure Help from another person eating meals?: None Help from another person taking care of personal grooming?: A Little Help from another person toileting, which includes using toliet, bedpan, or urinal?: A Little Help from another person bathing (including washing, rinsing, drying)?: A Little Help from another person to put on and taking off regular upper body clothing?: A Little Help from another person to put on and taking off regular lower body clothing?: A Little 6 Click Score: 19   End of Session Equipment Utilized During Treatment: Gait belt;Rolling Kuchenbecker Nurse Communication: Mobility status  Activity Tolerance: Patient tolerated treatment well Patient left: in bed;with call bell/phone within reach;with bed alarm set  OT Visit Diagnosis: Unsteadiness on feet (R26.81);Muscle weakness (generalized) (M62.81);History of falling (Z91.81)                Time: 0623-7628 OT Time Calculation (min): 23 min Charges:  OT General Charges $OT Visit: 1 Visit OT Evaluation $OT Eval Moderate Complexity: 1 Mod OT Treatments $  Self Care/Home Management : 8-22 mins  Nyomi Howser H. OTR/L Supplemental OT, Department of rehab services ([place]. 01/03/2021, 2:43 PM

## 2021-01-04 ENCOUNTER — Inpatient Hospital Stay (HOSPITAL_COMMUNITY): Payer: Medicare Other

## 2021-01-04 DIAGNOSIS — R6510 Systemic inflammatory response syndrome (SIRS) of non-infectious origin without acute organ dysfunction: Secondary | ICD-10-CM | POA: Diagnosis not present

## 2021-01-04 LAB — COMPREHENSIVE METABOLIC PANEL
ALT: 15 U/L (ref 0–44)
AST: 33 U/L (ref 15–41)
Albumin: 2.9 g/dL — ABNORMAL LOW (ref 3.5–5.0)
Alkaline Phosphatase: 72 U/L (ref 38–126)
Anion gap: 7 (ref 5–15)
BUN: <5 mg/dL — ABNORMAL LOW (ref 8–23)
CO2: 27 mmol/L (ref 22–32)
Calcium: 8.3 mg/dL — ABNORMAL LOW (ref 8.9–10.3)
Chloride: 100 mmol/L (ref 98–111)
Creatinine, Ser: 0.69 mg/dL (ref 0.61–1.24)
GFR, Estimated: >60 mL/min (ref >60)
Glucose, Bld: 102 mg/dL — ABNORMAL HIGH (ref 70–99)
Potassium: 2.7 mmol/L — CL (ref 3.5–5.1)
Sodium: 134 mmol/L — ABNORMAL LOW (ref 135–145)
Total Bilirubin: 1.1 mg/dL (ref 0.3–1.2)
Total Protein: 5.7 g/dL — ABNORMAL LOW (ref 6.5–8.1)

## 2021-01-04 LAB — CBC WITH DIFFERENTIAL/PLATELET
Abs Immature Granulocytes: 0.05 10*3/uL (ref 0.00–0.07)
Basophils Absolute: 0 10*3/uL (ref 0.0–0.1)
Basophils Relative: 1 %
Eosinophils Absolute: 0 10*3/uL (ref 0.0–0.5)
Eosinophils Relative: 1 %
HCT: 23 % — ABNORMAL LOW (ref 39.0–52.0)
Hemoglobin: 7.7 g/dL — ABNORMAL LOW (ref 13.0–17.0)
Immature Granulocytes: 1 %
Lymphocytes Relative: 22 %
Lymphs Abs: 1.3 10*3/uL (ref 0.7–4.0)
MCH: 33.9 pg (ref 26.0–34.0)
MCHC: 33.5 g/dL (ref 30.0–36.0)
MCV: 101.3 fL — ABNORMAL HIGH (ref 80.0–100.0)
Monocytes Absolute: 0.9 10*3/uL (ref 0.1–1.0)
Monocytes Relative: 15 %
Neutro Abs: 3.6 10*3/uL (ref 1.7–7.7)
Neutrophils Relative %: 60 %
Platelets: 91 10*3/uL — ABNORMAL LOW (ref 150–400)
RBC: 2.27 MIL/uL — ABNORMAL LOW (ref 4.22–5.81)
RDW: 17.2 % — ABNORMAL HIGH (ref 11.5–15.5)
WBC: 6 10*3/uL (ref 4.0–10.5)
nRBC: 1.2 % — ABNORMAL HIGH (ref 0.0–0.2)

## 2021-01-04 LAB — BRAIN NATRIURETIC PEPTIDE: B Natriuretic Peptide: 204.5 pg/mL — ABNORMAL HIGH (ref 0.0–100.0)

## 2021-01-04 LAB — PROCALCITONIN
Procalcitonin: <0.1 ng/mL
Procalcitonin: <0.1 ng/mL

## 2021-01-04 LAB — POTASSIUM: Potassium: 3.7 mmol/L (ref 3.5–5.1)

## 2021-01-04 LAB — LACTIC ACID, PLASMA: Lactic Acid, Venous: 3 mmol/L (ref 0.5–1.9)

## 2021-01-04 LAB — PHOSPHORUS: Phosphorus: 2.6 mg/dL (ref 2.5–4.6)

## 2021-01-04 LAB — MAGNESIUM: Magnesium: 2.2 mg/dL (ref 1.7–2.4)

## 2021-01-04 IMAGING — DX DG CHEST 1V PORT
1 series · 1 of 1 positions shown · non-contrast
Comparison: [DATE], [DATE]

CLINICAL DATA: Cough and fevers

EXAM:
PORTABLE CHEST 1 VIEW

[chest ap]
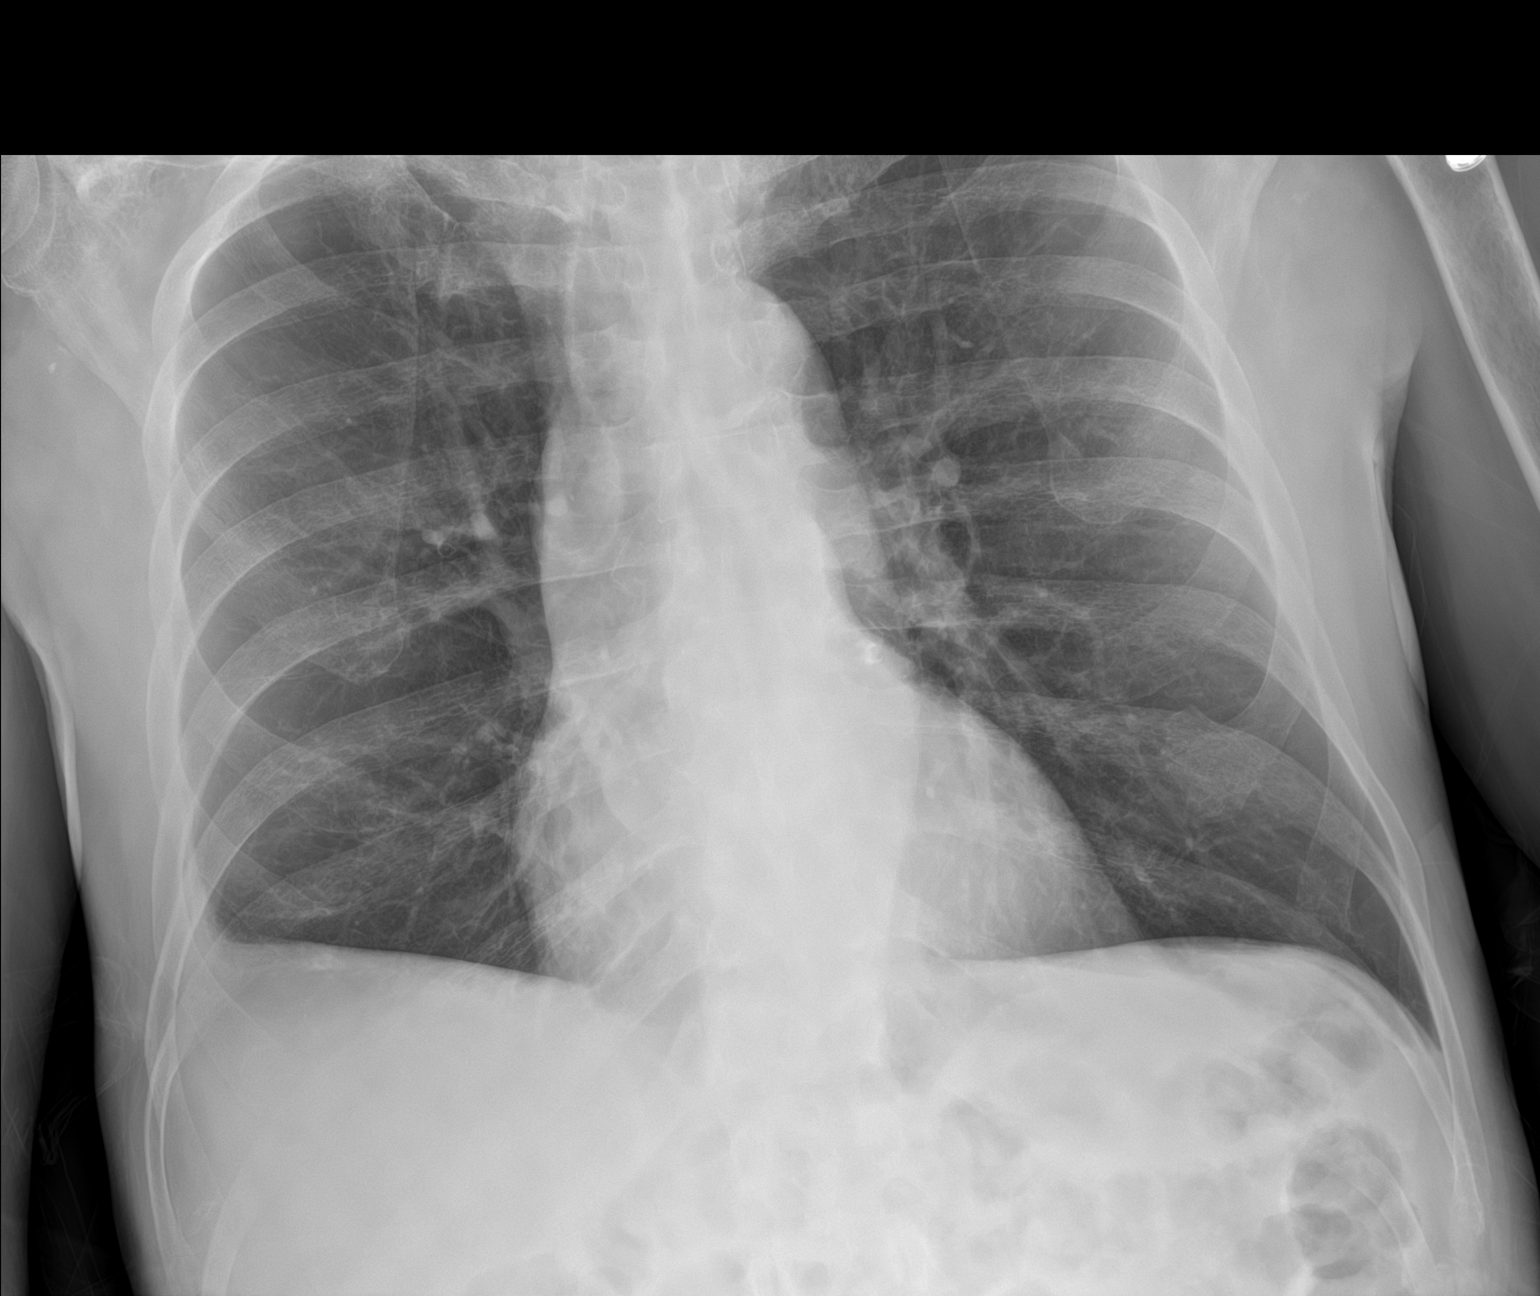

[1 of 1 positions shown; findings below may reference images not displayed]

FINDINGS: Cardiac shadow is within normal limits. The lungs are well aerated
bilaterally. Chronic blunting of the right costophrenic angle is
noted stable from prior exams. No focal infiltrate is seen. No bony
abnormality is noted.
IMPRESSION: No active disease.

## 2021-01-04 IMAGING — CT CT HEAD W/O CM
4 series · 15 of 47 positions shown, 17 images · non-contrast
Comparison: [DATE]

CLINICAL DATA: Subdural hematoma

EXAM:
CT HEAD WITHOUT CONTRAST
TECHNIQUE: Contiguous axial images were obtained from the base of the skull
through the vertex without intravenous contrast.

[Series 3: head wo · axial · 0.42mm/px · z∈[+1090,+1210]mm · 7 of 32 slices shown, 9 images]
[im 4/32  brain]
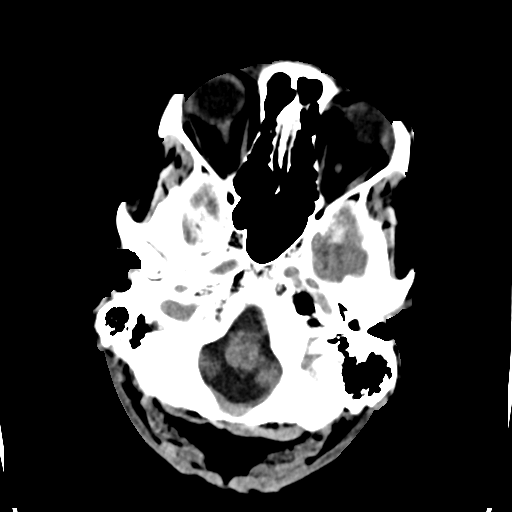
[im 4/32  bone]
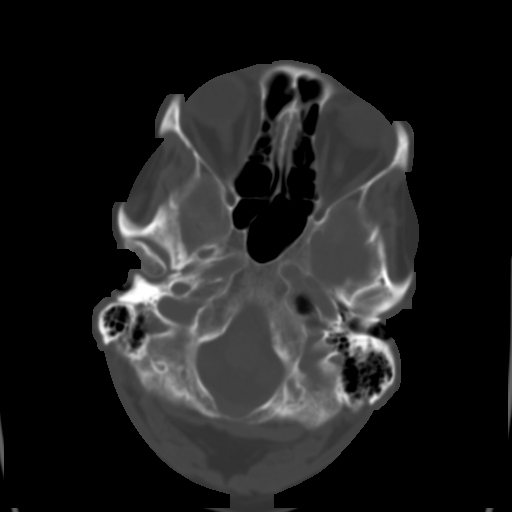
[im 8/32  brain]
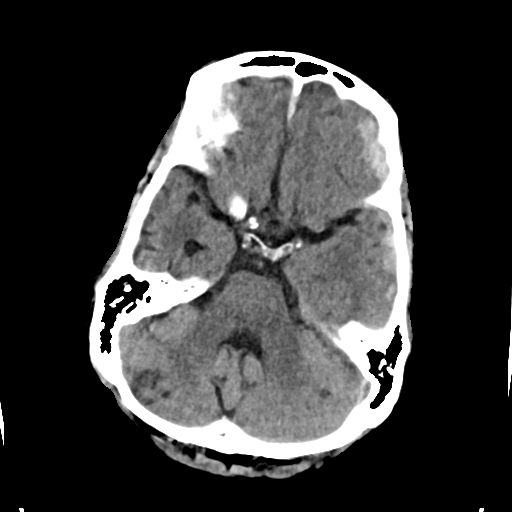
[im 12/32  brain]
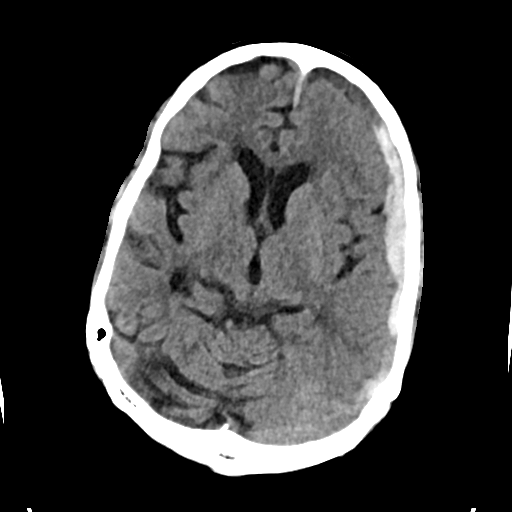
[im 16/32  brain]
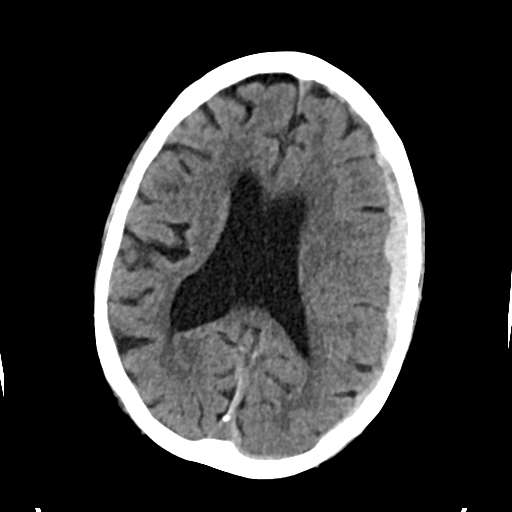
[im 20/32  brain]
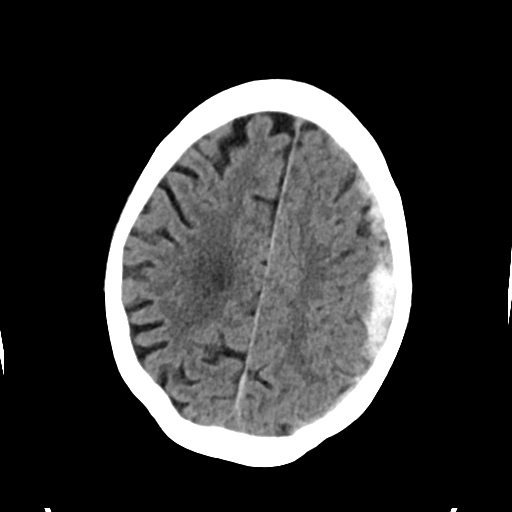
[im 20/32  bone]
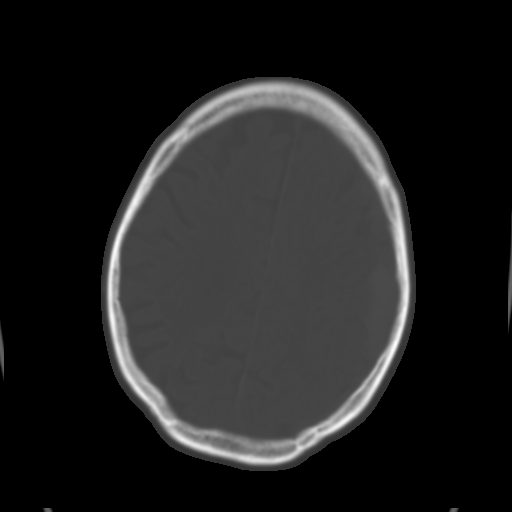
[im 24/32  brain]
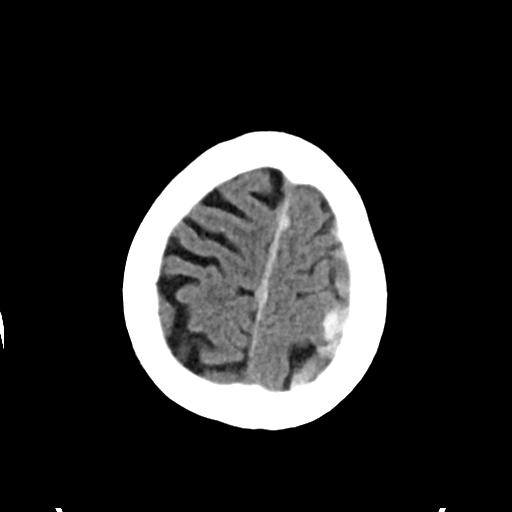
[im 28/32  brain]
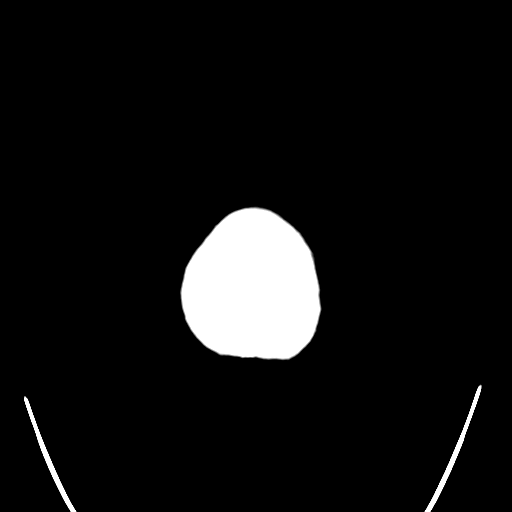

[Series 4: head bone · axial · 0.42mm/px · z∈[+1089,+1105]mm · 2 of 78 slices shown]
[im 8/78  bone]
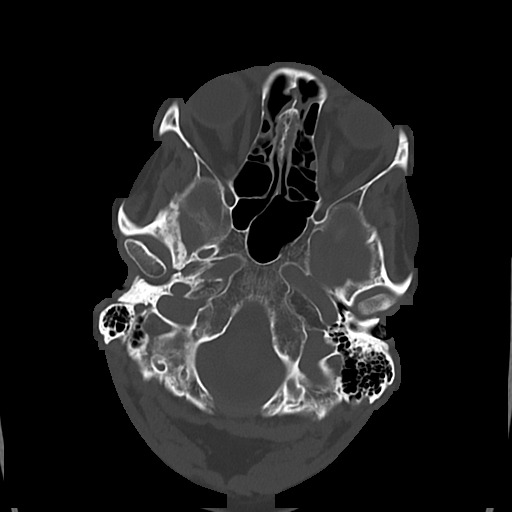
[im 16/78  bone]
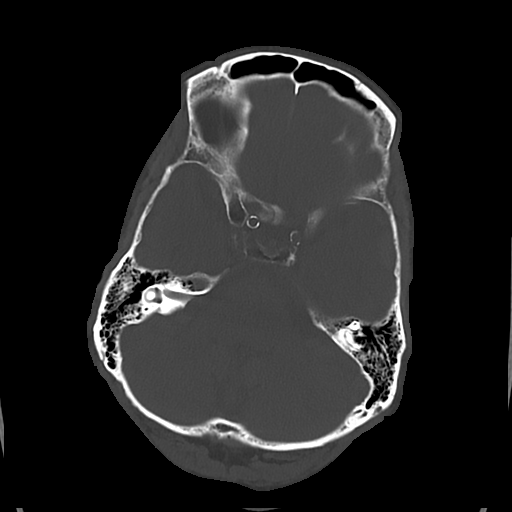

[Series 5: cor soft · coronal · 0.32mm/px · 3 of 67 slices shown]
[im 23/67  brain]
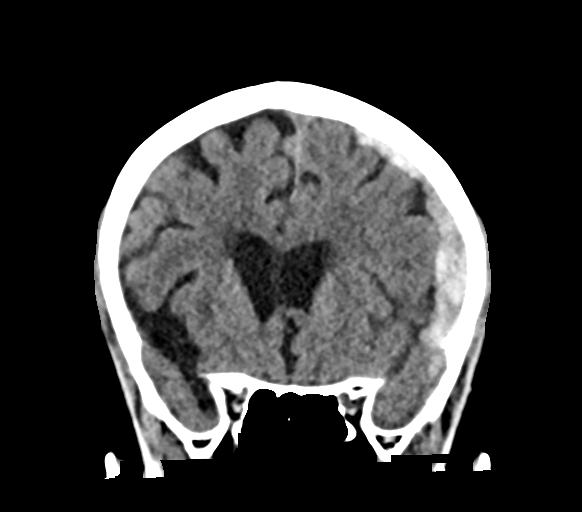
[im 30/67  brain]
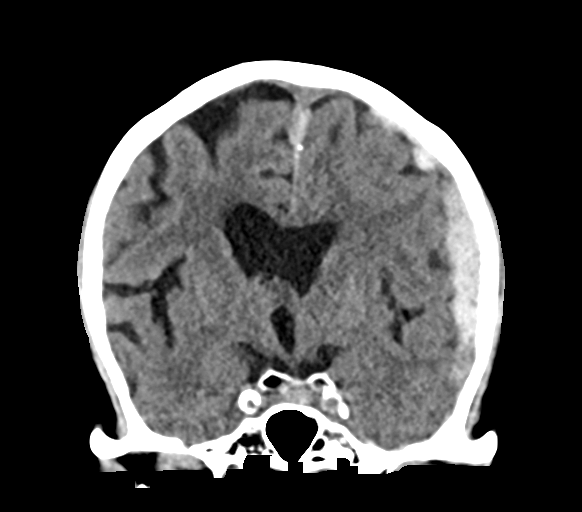
[im 37/67  brain]
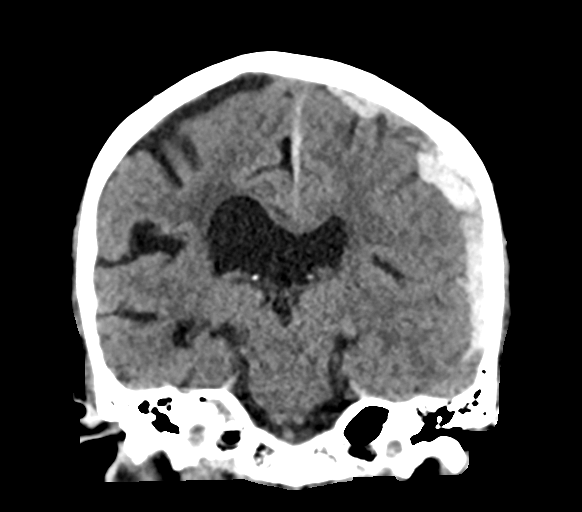

[Series 6: sag soft · sagittal · 0.32mm/px · 3 of 52 slices shown]
[im 18/52  brain]
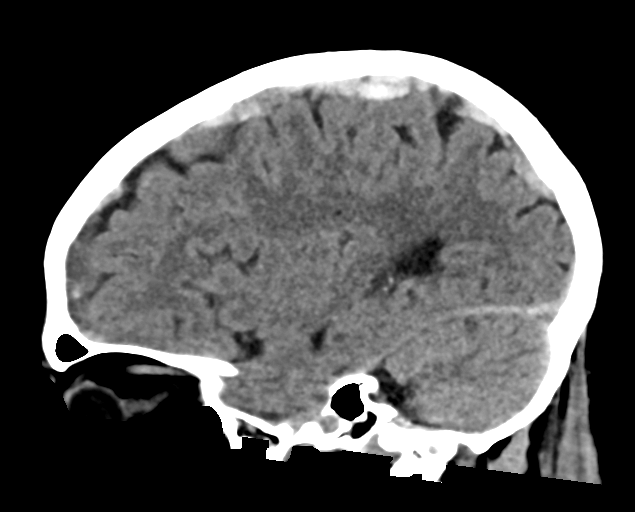
[im 26/52  brain]
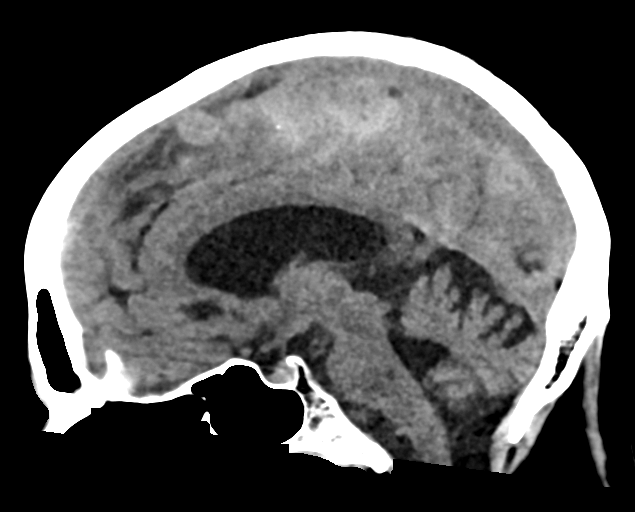
[im 35/52  brain]
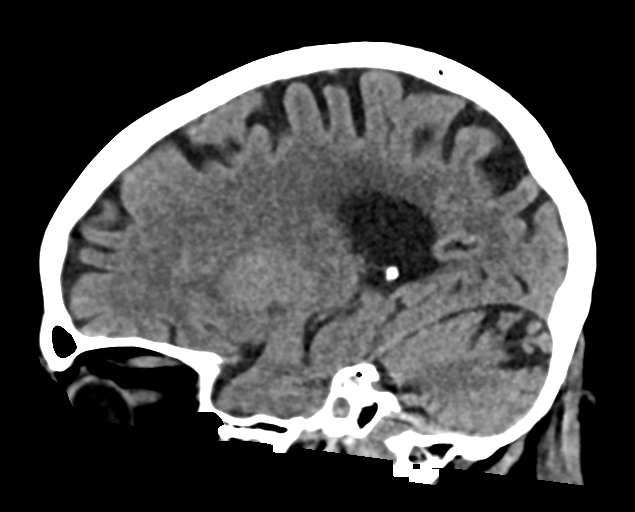

[15 of 47 positions shown; findings below may reference images not displayed]

FINDINGS: Brain: There is an acute appearing subdural hematoma again noted
along the left frontal and temporal lobes extending superiorly to
the level of the left convexity. The maximum thickness of this
subdural hematoma in the left convexity region is 1.4 cm, stable by
remeasurement. There is stable mass effect on portions of the left
frontal, temporal, anterior parietal lobes. At the level of the
foramen of LUIS ANGEL, there is 6 mm of midline shift toward the right,
slightly increased compared to 1 day prior. There is subdural
hematoma tracking along the falx with slightly more hemorrhage in
this area compared to previous study. There is no appreciable mass
effect along the falx, however. There is no intra-axial hemorrhage
or mass. There is underlying mild diffuse atrophy with mild
periventricular small vessel disease. There are small prior infarcts
in the cerebellum bilaterally. No acute infarct is appreciable.

Vascular: No hyperdense vessel. There is calcification in each
carotid siphon region.

Skull: The bony calvarium appears intact.

Sinuses/Orbits: There is opacification in several ethmoid air cells
as well as in the anterior superior right maxillary antrum.
Visualized orbits appear symmetric bilaterally.

Other: Mastoid air cells are clear.
IMPRESSION: Subdural hematoma on the left with essentially stable size and
contour compared to the previous study. There remains mass effect on
portions of the left frontal, left temporal, anterior left parietal
lobes. There is marginally more midline shift to the right compared
to 1 day prior. There is slightly more acute subdural hematoma in
the falx without appreciable mass effect in the falcine region.
There is no appreciable intra-axial hemorrhage.

There is mild atrophy with mild periventricular small vessel
disease. Small chronic appearing infarcts in the cerebellum, stable.
No evident acute infarct.

Foci of arterial vascular calcification noted. Areas of paranasal
sinus disease noted.

## 2021-01-04 MED ORDER — LACTATED RINGERS IV BOLUS
500.0000 mL | Freq: Once | INTRAVENOUS | Status: AC
  Administered 2021-01-04: 500 mL via INTRAVENOUS

## 2021-01-04 MED ORDER — POTASSIUM CHLORIDE CRYS ER 20 MEQ PO TBCR
40.0000 meq | EXTENDED_RELEASE_TABLET | Freq: Once | ORAL | Status: AC
  Administered 2021-01-04: 40 meq via ORAL
  Filled 2021-01-04: qty 2

## 2021-01-04 MED ORDER — POTASSIUM CHLORIDE 10 MEQ/100ML IV SOLN
10.0000 meq | INTRAVENOUS | Status: AC
  Administered 2021-01-04 (×3): 10 meq via INTRAVENOUS
  Filled 2021-01-04 (×3): qty 100

## 2021-01-04 MED ORDER — POTASSIUM CHLORIDE CRYS ER 20 MEQ PO TBCR
20.0000 meq | EXTENDED_RELEASE_TABLET | Freq: Once | ORAL | Status: AC
  Administered 2021-01-04: 20 meq via ORAL
  Filled 2021-01-04: qty 1

## 2021-01-04 NOTE — Plan of Care (Signed)
  Problem: Pain Managment: Goal: General experience of comfort will improve Outcome: Progressing   Problem: Safety: Goal: Ability to remain free from injury will improve Outcome: Progressing   Problem: Skin Integrity: Goal: Risk for impaired skin integrity will decrease Outcome: Progressing   
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
## 2021-01-04 NOTE — TOC Initial Note (Signed)
Transition of Care [facility]) - Initial/Assessment Note    Patient Details  Name: Seth [name] MRN: [id number] Date of Birth: 1956/01/16  [facility] CM/SW Contact:    Angelita Ingles, RN Phone Number: [phone]  01/04/2021, 3:27 PM  Clinical Narrative:                 [facility] consulted for patient for disposition needs. Patient from home but PT is recommending Rehab upon discharge. Patient is agreeable to rehab facility and choice of facilities has been given to the patient. Patient would like to stay in the [place] area. FL2, completed and patients info has been faxed out for bed offers. Insurance authorization has been initiated. PASRR [id number] A. CAGE aide completed and ETOH resources given. CM will continue to follow for disposition needs.         Patient Goals and CMS Choice        Expected Discharge Plan and Services                                                Prior Living Arrangements/Services                       Activities of Daily Living Home Assistive Devices/Equipment: [place] (specify type) ADL Screening (condition at time of admission) Patient's cognitive ability adequate to safely complete daily activities?: Yes Is the patient deaf or have difficulty hearing?: No Does the patient have difficulty seeing, even when wearing glasses/contacts?: No Does the patient have difficulty concentrating, remembering, or making decisions?: No Patient able to express need for assistance with ADLs?: Yes Does the patient have difficulty dressing or bathing?: No Independently performs ADLs?: Yes (appropriate for developmental age) Does the patient have difficulty walking or climbing stairs?: Yes Weakness of Legs: Both Weakness of Arms/Hands: None  Permission Sought/Granted                  Emotional Assessment              Admission diagnosis:  SIRS (systemic inflammatory response syndrome) ([place]) [R65.10] AKI (acute kidney  injury) ([place]) [N17.9] Sepsis, due to unspecified organism, unspecified whether acute organ dysfunction present [facility]) [A41.9] Patient Active Problem List   Diagnosis Date Noted  . SIRS (systemic inflammatory response syndrome) ([place]) 01/01/2021  . Alcohol use 01/01/2021  . History of CVA (cerebrovascular accident) 01/01/2021  . Sepsis ([place]) 09/14/2020  . COVID-19 virus infection 09/14/2020  . Osteomyelitis of left foot ([place])   . AKI (acute kidney injury) ([place]) 07/20/2020  . Hypoglycemia 07/20/2020  . Metabolic acidosis 38/05/1750  . Sinus tachycardia 05/31/2019  . DOE (dyspnea on exertion) 05/31/2019  . Generalized weakness 02/19/2019  . Hypertensive urgency 02/19/2019  . Hyperkalemia 02/19/2019  . Left fibular fracture 02/19/2019  . Chronic alcoholism ([place]) 02/19/2019  . Malnutrition of moderate degree 08/25/2017  . Acute respiratory failure with hypoxia ([place]) 08/23/2017  . Chest pain 08/23/2017  . Elevated blood pressure reading 08/23/2017  . Tobacco abuse 08/23/2017  . COPD (chronic obstructive pulmonary disease) ([place]) 08/23/2017   PCP:  Lucianne Lei, MD Pharmacy:   [facility] ([place]), [place] - [address] [place]  [address] Phone: [phone] Fax: [place], [place]  [address] [place] [address] Phone: [phone] Fax: [phone]  [id number] - [place], [place] [address] [place] [address] Phone: [phone] Fax: [place], [place] [address] Phone: [phone] Fax: [phone]     Social Determinants of Health ([place]) Interventions    Readmission Risk Interventions Readmission Risk Prevention Plan  06/04/2019  Transportation Screening Complete  PCP or Specialist Appt within 5-7 Days Complete  Home Care Screening Complete  Medication Review (RN CM) Complete  Some recent data might be hidden

## 2021-01-04 NOTE — Consult Note (Signed)
   [facility] CM Inpatient Consult   01/04/2021  JAGGAR BENKO 03/06/1956 [id number]  No longer in rosters but showing in [facility] banner    Patient reviewed for high risk score for unplanned readmission. The patient is not on the current member enrollment rosters for any of the [facility] risk contracted plans.  Will sign off.  Patient noted with an insurance plan change for 2022.   Reason:  Not a beneficiary currently attributed to one of the [place] Registry population. Membership roster was used to verify patient status.   For additional questions or referrals please contact:    Natividad Brood, RN BSN [facility] Liaison  [phone] business mobile phone Toll free office [phone]  Fax number: [phone] [email or web address]

## 2021-01-04 NOTE — Progress Notes (Addendum)
PROGRESS NOTE                                                                                                                                                                                                             Patient Demographics:    Seth [name], is a 65 y.o. male, DOB - 11-23-1955, WC:[id number]  Outpatient Primary MD for the patient is Lucianne Lei, MD    LOS - 2  Admit date - 01/01/2021    Chief Complaint  Patient presents with  . Altered Mental Status       Brief Narrative (HPI from H&P)  - Seth [name] is a 65 y.o. male with medical history significant for history of CVA, PVD s/p LLE angiography with DES placed to popliteal artery, left foot osteomyelitis s/p transmetatarsal hypertension 09/22/2020, COPD, hypertension, alcohol use, and tobacco use who presents to the ED for evaluation of altered mental status, in the ER his work-up was consistent with dehydration, hypoglycemia and possible SIRS.   Subjective:   Patient in bed, appears comfortable, denies any headache, no fever, no chest pain or pressure, no shortness of breath , no abdominal pain. No new focal weakness.   Assessment  & Plan :     1. Acute mental status decline - likely on by alcohol abuse, poor oral intake causing hypoglycemia, hypomagnesemia, dehydration, AKI and hyperkalemia, with possible DTs. CT showing SDH.  He has been extensively counseled to quit alcohol intake, hydrate with IV fluids, replace magnesium, hyperkalemia has resolved, monitor CBGs closely, encourage oral intake, continue PT OT and monitor.  For his SDH CT scan x 2 done, case discussed with Dr. Saintclair Halsted neurosurgery, for now no surgical intervention, hold off Plavix and monitor clinically.  Currently he is symptom-free.  2.  SIRS.  Likely due to dehydration no source of infection, hydrate with IV fluids and monitor.  3.  History of CVA.  Supportive care continue ASA with  statin.  4.  Macrocytic anemia.  Borderline B12 levels, placed on replacement, type screen and monitor.  5.  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  Supportive care. Was on DAPT with statin.  Case discussed with his vascular surgeon Dr. Donzetta Matters on 04/2021, continue aspirin and statin for now.  Can  hold Plavix indefinitely if needed for SDH.  6.  Alcohol abuse.  Counseled to quit last drink on 12/27/2020 ?,  He is slightly more confused on 01/03/2021 and could be going into early DTs, will monitor on CIWA protocol.  7.  COPD.  Stable on supportive care.  8.  Ongoing history of smoking.  Counseled to quit on NicoDerm patch.  9. Hypokalemia - replaced.       Condition - Extremely Guarded  Family Communication  : Sister [phone] Seth [name] 01/03/21  Code Status :  Full  Consults  :  Nuerosurgery Dr Saintclair Halsted -phone  PUD Prophylaxis : PPI   Procedures  :     CT Head - Non Acute      Disposition Plan  :    Status is:   Dispo: The patient is from: Home              Anticipated d/c is to: Home              Patient currently is not medically stable to d/c.   Difficult to place patient No   DVT Prophylaxis  :    Place and maintain sequential compression device Start: 01/03/21 1148 Place TED hose Start: 01/03/21 0658    Lab Results  Component Value Date   PLT 91 (L) 01/04/2021    Diet :  Diet Order            Diet regular Room service appropriate? Yes; Fluid consistency: Thin  Diet effective now                  Inpatient Medications  Scheduled Meds: . aspirin  81 mg Oral Daily  . feeding supplement  1 Container Oral TID BM  . ferrous sulfate  325 mg Oral BID WC  . finasteride  5 mg Oral Daily  . fluticasone furoate-vilanterol  1 puff Inhalation Daily  . folic acid  1 mg Oral Daily  . metoprolol tartrate  25 mg Oral BID  . multivitamin with minerals  1 tablet Oral  Daily  . nicotine  21 mg Transdermal Daily  . potassium chloride  20 mEq Oral Once  . rosuvastatin  10 mg Oral Daily  . thiamine  100 mg Oral Daily  . [START ON 01/06/2021] vitamin B-12  1,000 mcg Oral Daily   Continuous Infusions:  PRN Meds:.acetaminophen **OR** [DISCONTINUED] acetaminophen, albuterol, cloNIDine, dextrose, guaiFENesin, LORazepam **OR** LORazepam, [DISCONTINUED] ondansetron **OR** ondansetron (ZOFRAN) IV  Antibiotics  :    Anti-infectives (From admission, onward)   Start     Dose/Rate Route Frequency Ordered Stop   01/01/21 2330  vancomycin (VANCOREADY) IVPB 1250 mg/250 mL        1,250 mg 166.7 mL/hr over 90 Minutes Intravenous  Once 01/01/21 2302 01/02/21 0301   01/01/21 2315  piperacillin-tazobactam (ZOSYN) IVPB 3.375 g        3.375 g 12.5 mL/hr over 240 Minutes Intravenous  Once 01/01/21 2302 01/02/21 0404       Time Spent in minutes  30   [name] M.D on 01/04/2021 at 10:38 AM  To page go to [email or web address]   [facility] -  Office  [phone]    See all Orders from today for further details    Objective:   Vitals:   01/03/21 2037 01/04/21 0748 01/04/21 1015 01/04/21 1020  BP: 133/83  127/83 127/83  Pulse: (!) 110  (!) 114 (!) 112  Resp: 19   16  Temp: 98.2 F (  36.8 C)   98.7 F (37.1 C)  TempSrc:    Oral  SpO2: 100% 99%  100%  Weight:      Height:        Wt Readings from Last 3 Encounters:  01/02/21 62.4 kg  10/04/20 67.1 kg  09/27/20 67.3 kg     Intake/Output Summary (Last 24 hours) at 01/04/2021 1038 Last data filed at 01/04/2021 0405 Gross per 24 hour  Intake 50 ml  Output 1650 ml  Net -1600 ml     Physical Exam  Awake Alert, No new F.N deficits, Normal affect [place].AT,PERRAL Supple Neck,No JVD, No cervical lymphadenopathy appriciated.  Symmetrical Chest wall movement, Good air movement bilaterally, CTAB RRR,No Gallops, Rubs or new Murmurs, No Parasternal Heave +ve B.Sounds, Abd Soft, No tenderness, No  organomegaly appriciated, No rebound - guarding or rigidity. 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7*  CL 107  --   --  100  --  98 100  CO2 10*  --   --  18*  --  27 27  GLUCOSE 64*  --   --  82  --  120* 102*  BUN 14  --   --  13  --  6* <5*  CREATININE 1.51*  --   --  1.55*  --  0.81 0.69  CALCIUM 6.6*  --   --  8.3*  --  8.8* 8.3*  AST 46*  --   --  40  --  44* 33  ALT 14  --   --  16  --  17 15  ALKPHOS 65  --   --  66  --  68 72  BILITOT 3.2*  --   --  2.6*  --  1.0 1.1  ALBUMIN 2.7*  --   --  2.8*  --  3.0* 2.9*  MG  --   --   --  1.4*  --  1.6* 2.2  PROCALCITON  --   --   --   --  0.34 0.21 <0.10  LATICACIDVEN  --  2.8* 2.6*  --   --   --   --   INR 0.9  --   --   --   --   --   --   BNP  --   --   --   --   --  244.9* 204.5*    ------------------------------------------------------------------------------------------------------------------ No results for input(s): CHOL, HDL, LDLCALC, TRIG, CHOLHDL, LDLDIRECT in the last 72 hours.  Lab Results  Component Value Date   HGBA1C 4.2 (L) 07/20/2020   ------------------------------------------------------------------------------------------------------------------ No results for input(s):  TSH, T4TOTAL, T3FREE, THYROIDAB in the last 72 hours.  Invalid input(s): FREET3  Cardiac Enzymes No results  for input(s): CKMB, TROPONINI, MYOGLOBIN in the last 168 hours.  Invalid input(s): CK ------------------------------------------------------------------------------------------------------------------    Component Value Date/Time   BNP 204.5 (H) 01/04/2021 0314    Micro Results Recent Results (from the past 240 hour(s))  Culture, blood (Routine x 2)     Status: None (Preliminary result)   Collection Time: 01/01/21 10:04 PM   Specimen: BLOOD RIGHT HAND  Result Value Ref Range Status   Specimen Description BLOOD RIGHT HAND  Final   Special Requests   Final    BOTTLES DRAWN AEROBIC ONLY Blood Culture results may not be optimal due to an inadequate volume of blood received in culture bottles   Culture   Final    NO GROWTH 3 DAYS Performed at [facility], [place] [address], [place], [place] [address]    Report Status PENDING  Incomplete  Resp Panel by RT-PCR (Flu A&B, Covid) Nasopharyngeal Swab     Status: None   Collection Time: 01/01/21 10:09 PM   Specimen: Nasopharyngeal Swab; Nasopharyngeal(NP) swabs in vial transport medium  Result Value Ref Range Status   SARS Coronavirus 2 by RT PCR NEGATIVE NEGATIVE Final    Comment: (NOTE) SARS-CoV-2 target nucleic acids are NOT DETECTED.  The SARS-CoV-2 RNA is generally detectable in upper respiratory specimens during the acute phase of infection. The lowest concentration of SARS-CoV-2 viral copies this assay can detect is 138 copies/mL. A negative result does not preclude SARS-Cov-2 infection and should not be used as the sole basis for treatment or other patient management decisions. A negative result may occur with  improper specimen collection/handling, submission of specimen other than nasopharyngeal swab, presence of viral mutation(s) within the areas targeted by this assay, and inadequate number of viral copies(<138 copies/mL). A negative result must be combined with clinical observations, patient history, and  epidemiological information. The expected result is Negative.  Fact Sheet for Patients:  EntrepreneurPulse.com.au  Fact Sheet for Healthcare Providers:  IncredibleEmployment.be  This test is no t yet approved or cleared by the [place] FDA and  has been authorized for detection and/or diagnosis of SARS-CoV-2 by FDA under an Emergency Use Authorization (EUA). This EUA will remain  in effect (meaning this test can be used) for the duration of the COVID-19 declaration under Section 564(b)(1) of the Act, 21 U.S.C.section 360bbb-3(b)(1), unless the authorization is terminated  or revoked sooner.       Influenza A by PCR NEGATIVE NEGATIVE Final   Influenza B by PCR NEGATIVE NEGATIVE Final    Comment: (NOTE) The Xpert Xpress SARS-CoV-2/FLU/RSV plus assay is intended as an aid in the diagnosis of influenza from Nasopharyngeal swab specimens and should not be used as a sole basis for treatment. Nasal washings and aspirates are unacceptable for Xpert Xpress SARS-CoV-2/FLU/RSV testing.  Fact Sheet for Patients: EntrepreneurPulse.com.au  Fact Sheet for Healthcare Providers: IncredibleEmployment.be  This test is not yet approved or cleared by the [place] FDA and has been authorized for detection and/or diagnosis of SARS-CoV-2 by FDA under an Emergency Use Authorization (EUA). This EUA will remain in effect (meaning this test can be used) for the duration of the COVID-19 declaration under Section 564(b)(1) of the Act, 21 U.S.C. section 360bbb-3(b)(1), unless the authorization is terminated or revoked.  Performed at [facility], [place] [address], [place], [place] [address]   Culture, blood (Routine x 2)  Status: None (Preliminary result)   Collection Time: 01/01/21 11:34 PM   Specimen: BLOOD LEFT ARM  Result Value Ref Range Status   Specimen Description BLOOD LEFT ARM  Final   Special Requests    Final    BOTTLES DRAWN AEROBIC AND ANAEROBIC Blood Culture adequate volume   Culture   Final    NO GROWTH 2 DAYS Performed at [facility], [address] [place], [place] [address]    Report Status PENDING  Incomplete    Radiology Reports  DG Chest 2 View  Result Date: 01/01/2021 CLINICAL DATA:  Altered mental status and possible sepsis EXAM: CHEST - 2 VIEW COMPARISON:  09/19/2020 FINDINGS: The heart size and mediastinal contours are within normal limits. Both lungs are clear. The visualized skeletal structures are unremarkable. IMPRESSION: No active cardiopulmonary disease. Electronically Signed   By: Inez Catalina M.D.   On: 01/01/2021 21:44   CT HEAD WO CONTRAST  Result Date: 01/04/2021 CLINICAL DATA:  Subdural hematoma EXAM: CT HEAD WITHOUT CONTRAST TECHNIQUE: Contiguous axial images were obtained from the base of the skull through the vertex without intravenous contrast. COMPARISON:  Jan 03, 2021 FINDINGS: Brain: There is an acute appearing subdural hematoma again noted along the left frontal and temporal lobes extending superiorly to the level of the left convexity. The maximum thickness of this subdural hematoma in the left convexity region is 1.4 cm, stable by remeasurement. There is stable mass effect on portions of the left frontal, temporal, anterior parietal lobes. At the level of the foramen of Monro, there is 6 mm of midline shift toward the right, slightly increased compared to 1 day prior. There is subdural hematoma tracking along the falx with slightly more hemorrhage in this area compared to previous study. There is no appreciable mass effect along the falx, however. There is no intra-axial hemorrhage or mass. There is underlying mild diffuse atrophy with mild periventricular small vessel disease. There are small prior infarcts in the cerebellum bilaterally. No acute infarct is appreciable. Vascular: No hyperdense vessel. There is calcification in each carotid siphon region.  Skull: The bony calvarium appears intact. Sinuses/Orbits: There is opacification in several ethmoid air cells as well as in the anterior superior right maxillary antrum. Visualized orbits appear symmetric bilaterally. Other: Mastoid air cells are clear. IMPRESSION: Subdural hematoma on the left with essentially stable size and contour compared to the previous study. There remains mass effect on portions of the left frontal, left temporal, anterior left parietal lobes. There is marginally more midline shift to the right compared to 1 day prior. There is slightly more acute subdural hematoma in the falx without appreciable mass effect in the falcine region. There is no appreciable intra-axial hemorrhage. There is mild atrophy with mild periventricular small vessel disease. Small chronic appearing infarcts in the cerebellum, stable. No evident acute infarct. Foci of arterial vascular calcification noted. Areas of paranasal sinus disease noted. Electronically Signed   By: Lowella Grip III M.D.   On: 01/04/2021 08:01   CT HEAD WO CONTRAST  Result Date: 01/03/2021 CLINICAL DATA:  Delirium EXAM: CT HEAD WITHOUT CONTRAST TECHNIQUE: Contiguous axial images were obtained from the base of the skull through the vertex without intravenous contrast. COMPARISON:  09/14/2020 FINDINGS: Brain: Hyperdense subdural hemorrhage is present along the left cerebral convexity measuring up to 9 mm thickness. Small volume hyperdense subdural hemorrhage is also present along the falx. Mass effect is mild with 4 mm of rightward midline shift at the foramen of  Monro. 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 Gray-white differentiation is preserved. Prominence of the ventricles and sulci reflects generalized parenchymal volume loss. Chronic bilateral cerebellar infarcts. Patchy hypoattenuation in the supratentorial white matter is nonspecific but probably reflects stable chronic microvascular ischemic changes.  Vascular: There is intracranial atherosclerotic calcification at the skull base Skull: Unremarkable. Sinuses/Orbits: Patchy paranasal sinus mucosal thickening. No acute orbital abnormality. Other: Mastoid air cells are clear. IMPRESSION: Acute left cerebral convexity subdural hemorrhage. Minimal additional subdural hemorrhage along the falx. Mass effect is mild with minor rightward midline shift. Stable chronic findings detailed above. These results were called by telephone at the time of interpretation on 01/03/2021 at 11:40 am to provider [facility] , who verbally acknowledged these results. Electronically Signed   By: Macy Mis M.D.   On: 01/03/2021 11:44

## 2021-01-04 NOTE — NC FL2 (Signed)
[place] LEVEL OF CARE SCREENING TOOL     IDENTIFICATION  Patient Name: Seth [name] Birthdate: 01-05-1956 Sex: male Admission Date (Current Location): 01/01/2021  [facility] and [place] Number:  Herbalist and Address:  [facility], [place] [address], [place], [place] [address]      Provider Number: [id number]  Attending Physician Name and Address:  Thurnell Lose, MD  Relative Name and Phone Number:  Jermone Geister [phone]    Current Level of Care: Hospital Recommended Level of Care: [place] Prior Approval Number:    Date Approved/Denied:   PASRR Number: [id number]  Discharge Plan: SNF    Current Diagnoses: Patient Active Problem List   Diagnosis Date Noted  . SIRS (systemic inflammatory response syndrome) ([place]) 01/01/2021  . Alcohol use 01/01/2021  . History of CVA (cerebrovascular accident) 01/01/2021  . Sepsis ([place]) 09/14/2020  . COVID-19 virus infection 09/14/2020  . Osteomyelitis of left foot ([place])   . AKI (acute kidney injury) ([place]) 07/20/2020  . Hypoglycemia 07/20/2020  . Metabolic acidosis 44/08/270  . Sinus tachycardia 05/31/2019  . DOE (dyspnea on exertion) 05/31/2019  . Generalized weakness 02/19/2019  . Hypertensive urgency 02/19/2019  . Hyperkalemia 02/19/2019  . Left fibular fracture 02/19/2019  . Chronic alcoholism ([place]) 02/19/2019  . Malnutrition of moderate degree 08/25/2017  . Acute respiratory failure with hypoxia ([place]) 08/23/2017  . Chest pain 08/23/2017  . Elevated blood pressure reading 08/23/2017  . Tobacco abuse 08/23/2017  . COPD (chronic obstructive pulmonary disease) ([place]) 08/23/2017    Orientation RESPIRATION BLADDER Height & Weight     Self,Time,Situation,Place  Normal Incontinent,External catheter Weight: 62.4 kg Height:  6\' 3"  (190.5 cm)  BEHAVIORAL SYMPTOMS/MOOD NEUROLOGICAL BOWEL NUTRITION STATUS     (n/a) Continent Diet  AMBULATORY STATUS  COMMUNICATION OF NEEDS Skin   Limited Assist Verbally Other (Comment) (dry/ flaky)                       Personal Care Assistance Level of Assistance  Bathing,Feeding,Dressing Bathing Assistance: Limited assistance Feeding assistance: Limited assistance (set up)   Total Care Assistance: Limited assistance   Functional Limitations Info  Sight,Hearing,Speech Sight Info: Impaired Hearing Info: Adequate Speech Info: Adequate    SPECIAL CARE FACTORS FREQUENCY  PT (By licensed PT),OT (By licensed OT)     PT Frequency: 5X OT Frequency: 5X            Contractures Contractures Info: Not present    Additional Factors Info  Code Status,Allergies,Psychotropic,Insulin Sliding Scale,Isolation Precautions,Suctioning Needs Code Status Info: Full Allergies Info: no known allergies Psychotropic Info: see d/c summary for psychotropic information Insulin Sliding Scale Info: see discharge summary for sliding scale information Isolation Precautions Info: n/a Suctioning Needs: n/a   Current Medications (01/04/2021):  This is the current hospital active medication list Current Facility-Administered Medications  Medication Dose Route Frequency Provider Last Rate Last Admin  . acetaminophen (TYLENOL) tablet 650 mg  650 mg Oral Q6H PRN Lenore Cordia, MD   650 mg at 01/02/21 2033  . albuterol (VENTOLIN HFA) 108 (90 Base) MCG/ACT inhaler 2 puff  2 puff Inhalation Q6H PRN Zada Finders R, MD      . aspirin chewable tablet 81 mg  81 mg Oral Daily Thurnell Lose, MD   81 mg at 01/04/21 1015  . cloNIDine (CATAPRES) tablet 0.1 mg  0.1 mg Oral Q6H PRN Thurnell Lose, MD      .  dextrose 50 % solution 50 mL  1 ampule Intravenous PRN Zada Finders R, MD      . feeding supplement (BOOST / RESOURCE BREEZE) liquid 1 Container  1 Container Oral TID BM [name] K, MD      . ferrous sulfate tablet 325 mg  325 mg Oral BID WC Thurnell Lose, MD   325 mg at 01/04/21 1016  . finasteride  (PROSCAR) tablet 5 mg  5 mg Oral Daily Zada Finders R, MD   5 mg at 01/04/21 1015  . fluticasone furoate-vilanterol (BREO ELLIPTA) 200-25 MCG/INH 1 puff  1 puff Inhalation Daily Lenore Cordia, MD   1 puff at 01/04/21 0748  . folic acid (FOLVITE) tablet 1 mg  1 mg Oral Daily Zada Finders R, MD   1 mg at 01/04/21 1016  . guaiFENesin (MUCINEX) 12 hr tablet 600 mg  600 mg Oral BID PRN Lenore Cordia, MD   600 mg at 01/02/21 0126  . LORazepam (ATIVAN) tablet 1-4 mg  1-4 mg Oral Q1H PRN Thurnell Lose, MD       Or  . LORazepam (ATIVAN) injection 1-4 mg  1-4 mg Intravenous Q1H PRN Thurnell Lose, MD      . metoprolol tartrate (LOPRESSOR) tablet 25 mg  25 mg Oral BID Thurnell Lose, MD   25 mg at 01/04/21 1015  . multivitamin with minerals tablet 1 tablet  1 tablet Oral Daily Zada Finders R, MD   1 tablet at 01/04/21 1016  . nicotine (NICODERM CQ - dosed in mg/24 hours) patch 21 mg  21 mg Transdermal Daily Zada Finders R, MD   21 mg at 01/04/21 1027  . ondansetron (ZOFRAN) injection 4 mg  4 mg Intravenous Q6H PRN Zada Finders R, MD      . rosuvastatin (CRESTOR) tablet 10 mg  10 mg Oral Daily Zada Finders R, MD   10 mg at 01/04/21 1016  . thiamine tablet 100 mg  100 mg Oral Daily Zada Finders R, MD   100 mg at 01/04/21 1016  . [START ON 01/06/2021] vitamin B-12 (CYANOCOBALAMIN) tablet 1,000 mcg  1,000 mcg Oral Daily Thurnell Lose, MD         Discharge Medications: Please see discharge summary for a list of discharge medications.  Relevant Imaging Results:  Relevant Lab Results:   Additional Information SS# [id number]  Angelita Ingles, RN

## 2021-01-04 NOTE — Progress Notes (Addendum)
Physical Therapy Treatment Patient Details Name: Seth [name] MRN: [id number] DOB: 05-Jun-1956 Today's Date: 01/04/2021    History of Present Illness Seth [name] is a 65 y.o. male who presents with AMS, 1 fall, and vomiting. Admitted for SIRS, AKI. Incidental finding of SDH as well.  PMH: alcohol and tobacco abuse, covid 3 mos ago, CVA, PVD, L transmet amp, HTN, COPD    PT Comments    Pt admitted with above diagnosis. Pt was able to ambulate to door and back x 2 but did have orthostatic changes again and needing to limit mobility due to dizziness.  Pt generally steady with RW but appears to need to continue therapy for safety prior to d/c home.  Agree with SNF.  Updated frequency to 2x week as this is appropriate frequency for SNF as d/c plan.   Pt currently with functional limitations due to balance and endurance deficits. Pt will benefit from skilled PT to increase their independence and safety with mobility to allow discharge to the venue listed below.    Orthostatic BPs  Supine NT as pt sitting on arrival on EOB  Sitting 149/88, 97 bpm  Standing 101/66, 109 bpm  Standing after 3 min 95/63, 110 bpm  Pt slightly dizzy with standing and was able to walk to door and back and took second BP with greater drop in BP and ambulated once more and then reported he had incr dizziness therefore allowed pt to return to sitting eOB.  BP once sitting was 114/71 with HR 102 bpm.  Follow Up Recommendations  SNF;Supervision/Assistance - 24 hour     Equipment Recommendations  None recommended by PT    Recommendations for Other Services OT consult     Precautions / Restrictions Precautions Precautions: Fall Precaution Comments: orthostatic Restrictions Weight Bearing Restrictions: No    Mobility  Bed Mobility Overal bed mobility: Needs Assistance Bed Mobility: Supine to Sit     Supine to sit: Supervision     General bed mobility comments: Pt sitting on EOB on arrival finishing up  breakfast    Transfers Overall transfer level: Needs assistance Equipment used: Rolling Gibbard (2 wheeled) Transfers: Sit to/from Stand Sit to Stand: Min assist;From elevated surface         General transfer comment: Needed steadying assist for balance to come to standing  Ambulation/Gait Ambulation/Gait assistance: Min assist;Min guard Gait Distance (Feet): 65 Feet Assistive device: Rolling Mcvay (2 wheeled) Gait Pattern/deviations: Trunk flexed;Shuffle;Decreased stride length Gait velocity: decreased Gait velocity interpretation: <1.31 ft/sec, indicative of household ambulator General Gait Details: pt needed assist moving RW as well as cues for directing RW and feet to chair.  Did limit ambulation as pt did report dizziness after being up a few minutes with BP change.   Stairs             Wheelchair Mobility    Modified Rankin (Stroke Patients Only)       Balance Overall balance assessment: Needs assistance;History of Falls Sitting-balance support: Feet supported;No upper extremity supported Sitting balance-[name] Scale: Fair     Standing balance support: Bilateral upper extremity supported;During functional activity Standing balance-[name] Scale: Poor Standing balance comment: Requires BUE support to maintain dynamic standing balance with UEs on RW                            Cognition Arousal/Alertness: Awake/alert Behavior During Therapy: Flat affect Overall Cognitive Status: No family/caregiver present to determine baseline cognitive  functioning                                 General Comments: intact for following commands and basic conversation, has difficulty giving details of recent history      Exercises      General Comments General comments (skin integrity, edema, etc.): See orthostatic VS above      Pertinent Vitals/Pain Pain Assessment: No/denies pain    Home Living                      Prior  Function            PT Goals (current goals can now be found in the care plan section) Acute Rehab PT Goals Patient Stated Goal: In agreement with rehab. Progress towards PT goals: Progressing toward goals    Frequency    Min 2X/week      PT Plan Current plan remains appropriate;Frequency needs to be updated    Co-evaluation              AM-PAC PT "6 Clicks" Mobility   Outcome Measure  Help needed turning from your back to your side while in a flat bed without using bedrails?: None Help needed moving from lying on your back to sitting on the side of a flat bed without using bedrails?: None Help needed moving to and from a bed to a chair (including a wheelchair)?: A Little Help needed standing up from a chair using your arms (e.g., wheelchair or bedside chair)?: A Little Help needed to walk in hospital room?: A Little Help needed climbing 3-5 steps with a railing? : A Lot 6 Click Score: 19    End of Session Equipment Utilized During Treatment: Gait belt Activity Tolerance: Treatment limited secondary to medical complications (Comment) (Orthostatic hypotension) Patient left: in bed;with bed alarm set;with call bell/phone within reach (on EOB) Nurse Communication: Mobility status PT Visit Diagnosis: Unsteadiness on feet (R26.81);Repeated falls (R29.6);Muscle weakness (generalized) (M62.81);Dizziness and giddiness (R42)     Time: 0962-8366 PT Time Calculation (min) (ACUTE ONLY): 25 min  Charges:  $Gait Training: 23-37 mins                     Seth [name] M,PT Acute [facility] [phone] (pager)   Seth [name] 01/04/2021, 12:47 PM

## 2021-01-04 NOTE — Progress Notes (Signed)
Cross-coverage note:   Patient seen for red MEWS. He has new fever and is tachycardic to 130s.   EKG confirms sinus tachycardia. He reports mild cough but denies SOB and denies dysuria or abdominal, suprapubic or flank pain. He appears well and had no acute complaints. Plan to give 500 cc LR bolus, treat fever with APAP, get blood cultures, and check CXR. Discussed plan with patient and RN at bedside.

## 2021-01-05 ENCOUNTER — Inpatient Hospital Stay (HOSPITAL_COMMUNITY): Payer: Medicare Other

## 2021-01-05 DIAGNOSIS — R6510 Systemic inflammatory response syndrome (SIRS) of non-infectious origin without acute organ dysfunction: Secondary | ICD-10-CM | POA: Diagnosis not present

## 2021-01-05 LAB — URINALYSIS, ROUTINE W REFLEX MICROSCOPIC
Bilirubin Urine: NEGATIVE
Glucose, UA: NEGATIVE mg/dL
Ketones, ur: NEGATIVE mg/dL
Leukocytes,Ua: NEGATIVE
Nitrite: NEGATIVE
Protein, ur: NEGATIVE mg/dL
Specific Gravity, Urine: 1.005 (ref 1.005–1.030)
pH: 9 — ABNORMAL HIGH (ref 5.0–8.0)

## 2021-01-05 LAB — CBC WITH DIFFERENTIAL/PLATELET
Abs Immature Granulocytes: 0.03 10*3/uL (ref 0.00–0.07)
Basophils Absolute: 0 10*3/uL (ref 0.0–0.1)
Basophils Relative: 0 %
Eosinophils Absolute: 0.1 10*3/uL (ref 0.0–0.5)
Eosinophils Relative: 1 %
HCT: 23.2 % — ABNORMAL LOW (ref 39.0–52.0)
Hemoglobin: 7.6 g/dL — ABNORMAL LOW (ref 13.0–17.0)
Immature Granulocytes: 1 %
Lymphocytes Relative: 23 %
Lymphs Abs: 1.5 10*3/uL (ref 0.7–4.0)
MCH: 33.8 pg (ref 26.0–34.0)
MCHC: 32.8 g/dL (ref 30.0–36.0)
MCV: 103.1 fL — ABNORMAL HIGH (ref 80.0–100.0)
Monocytes Absolute: 1.5 10*3/uL — ABNORMAL HIGH (ref 0.1–1.0)
Monocytes Relative: 23 %
Neutro Abs: 3.3 10*3/uL (ref 1.7–7.7)
Neutrophils Relative %: 52 %
Platelets: 119 10*3/uL — ABNORMAL LOW (ref 150–400)
RBC: 2.25 MIL/uL — ABNORMAL LOW (ref 4.22–5.81)
RDW: 17.3 % — ABNORMAL HIGH (ref 11.5–15.5)
WBC: 6.4 10*3/uL (ref 4.0–10.5)
nRBC: 0.8 % — ABNORMAL HIGH (ref 0.0–0.2)

## 2021-01-05 LAB — COMPREHENSIVE METABOLIC PANEL
ALT: 12 U/L (ref 0–44)
AST: 25 U/L (ref 15–41)
Albumin: 2.8 g/dL — ABNORMAL LOW (ref 3.5–5.0)
Alkaline Phosphatase: 70 U/L (ref 38–126)
Anion gap: 5 (ref 5–15)
BUN: 7 mg/dL — ABNORMAL LOW (ref 8–23)
CO2: 26 mmol/L (ref 22–32)
Calcium: 8.7 mg/dL — ABNORMAL LOW (ref 8.9–10.3)
Chloride: 103 mmol/L (ref 98–111)
Creatinine, Ser: 0.78 mg/dL (ref 0.61–1.24)
GFR, Estimated: >60 mL/min (ref >60)
Glucose, Bld: 97 mg/dL (ref 70–99)
Potassium: 3.6 mmol/L (ref 3.5–5.1)
Sodium: 134 mmol/L — ABNORMAL LOW (ref 135–145)
Total Bilirubin: 0.8 mg/dL (ref 0.3–1.2)
Total Protein: 5.9 g/dL — ABNORMAL LOW (ref 6.5–8.1)

## 2021-01-05 LAB — PHOSPHORUS: Phosphorus: 2.4 mg/dL — ABNORMAL LOW (ref 2.5–4.6)

## 2021-01-05 LAB — PROCALCITONIN: Procalcitonin: <0.1 ng/mL

## 2021-01-05 LAB — MAGNESIUM: Magnesium: 2 mg/dL (ref 1.7–2.4)

## 2021-01-05 LAB — BRAIN NATRIURETIC PEPTIDE: B Natriuretic Peptide: 162.8 pg/mL — ABNORMAL HIGH (ref 0.0–100.0)

## 2021-01-05 LAB — LACTIC ACID, PLASMA: Lactic Acid, Venous: 1.2 mmol/L (ref 0.5–1.9)

## 2021-01-05 IMAGING — CT CT HEAD W/O CM
4 series · 14 of 47 positions shown, 16 images · non-contrast
Comparison: Earlier today, additional priors.

CLINICAL DATA: Follow-up subdural hematoma.

EXAM:
CT HEAD WITHOUT CONTRAST
TECHNIQUE: Contiguous axial images were obtained from the base of the skull
through the vertex without intravenous contrast.

[Series 3: head wo · axial · 0.36mm/px · z∈[+1134,+1251]mm · 7 of 34 slices shown, 9 images]
[im 5/34  brain]
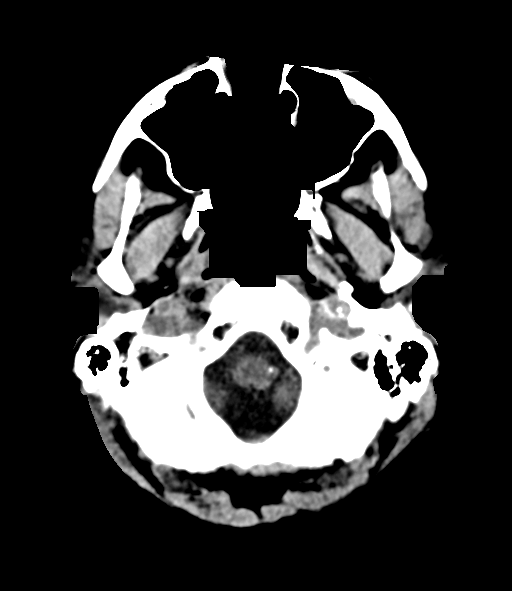
[im 5/34  bone]
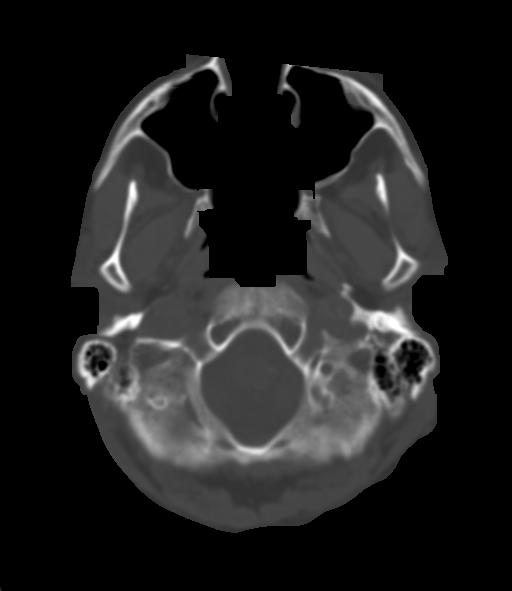
[im 9/34  brain]
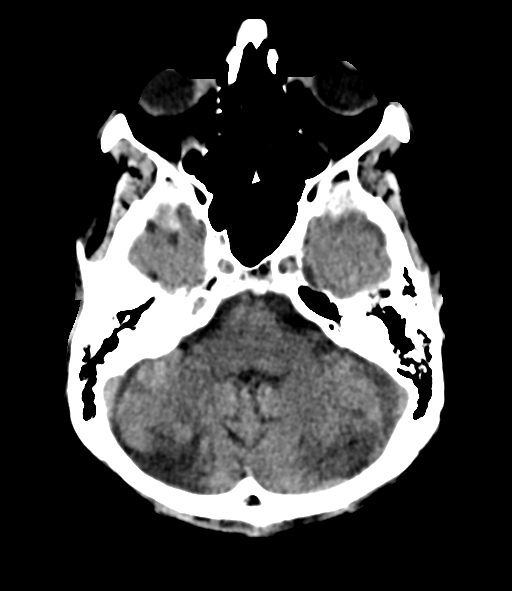
[im 13/34  brain]
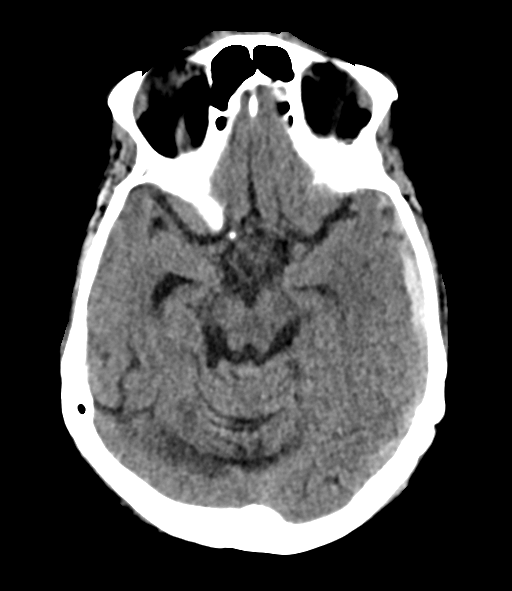
[im 17/34  brain]
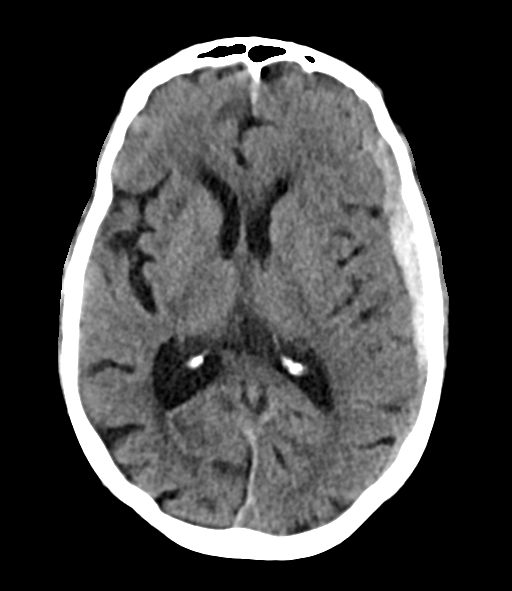
[im 21/34  brain]
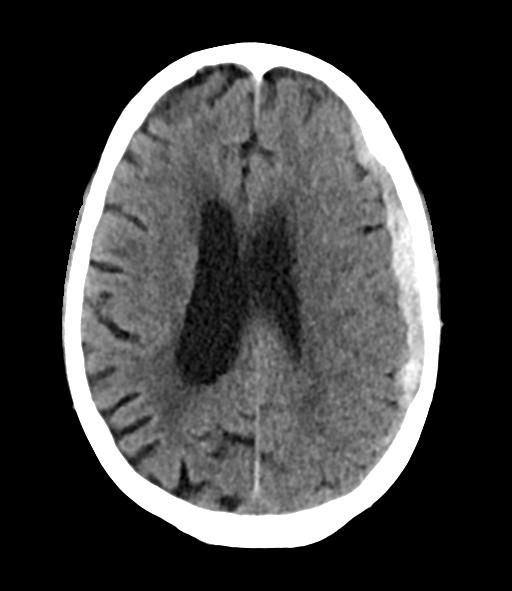
[im 21/34  bone]
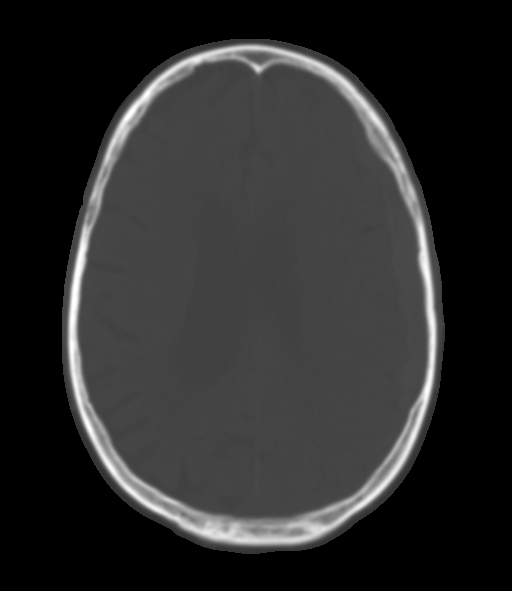
[im 25/34  brain]
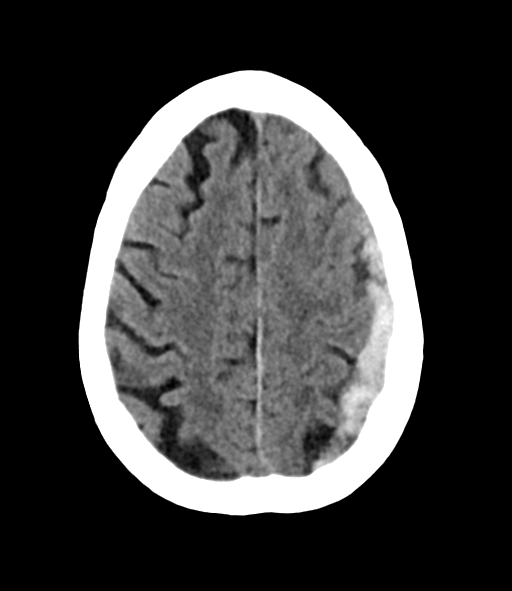
[im 29/34  brain]
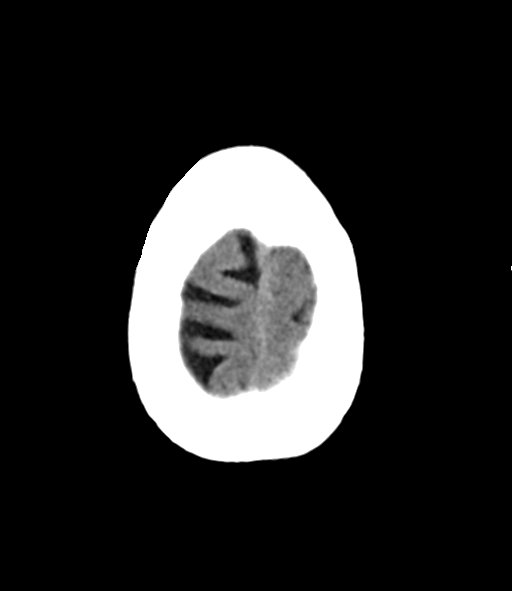

[Series 4: cor soft · coronal · 0.31mm/px · 3 of 70 slices shown]
[im 24/70  brain]
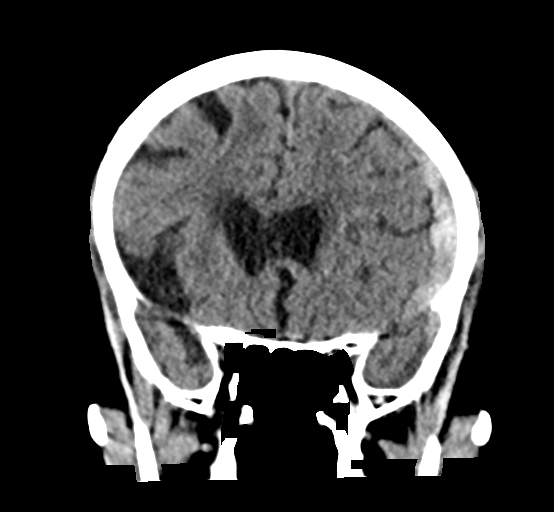
[im 31/70  brain]
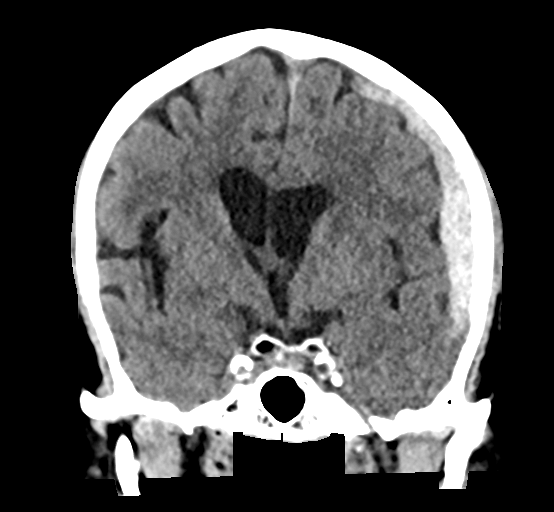
[im 39/70  brain]
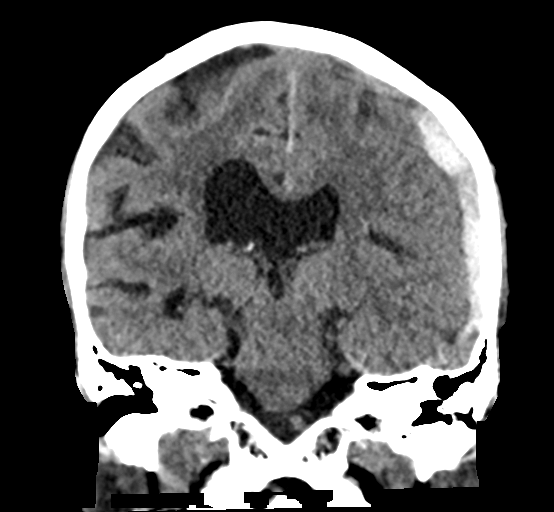

[Series 5: sag soft · sagittal · 0.31mm/px · 3 of 57 slices shown]
[im 19/57  brain]
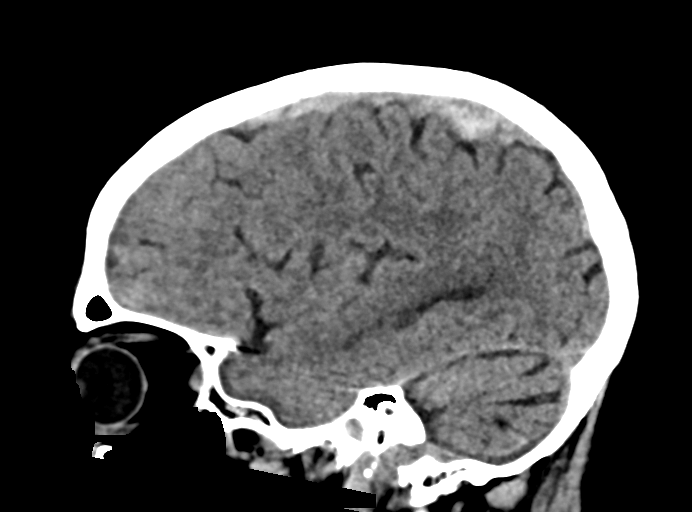
[im 29/57  brain]
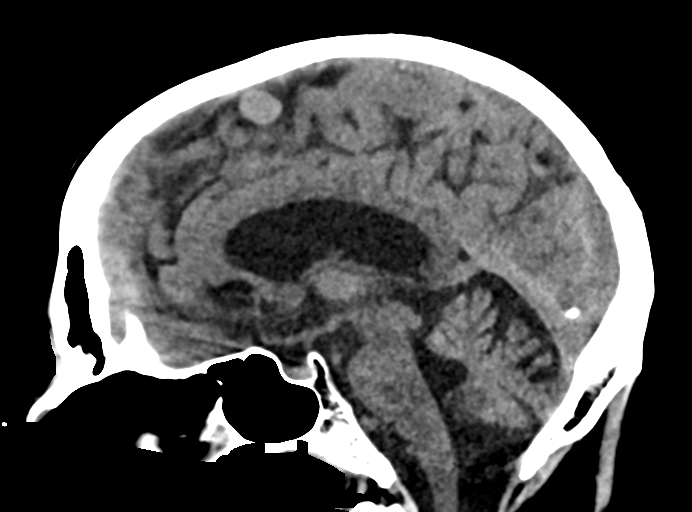
[im 38/57  brain]
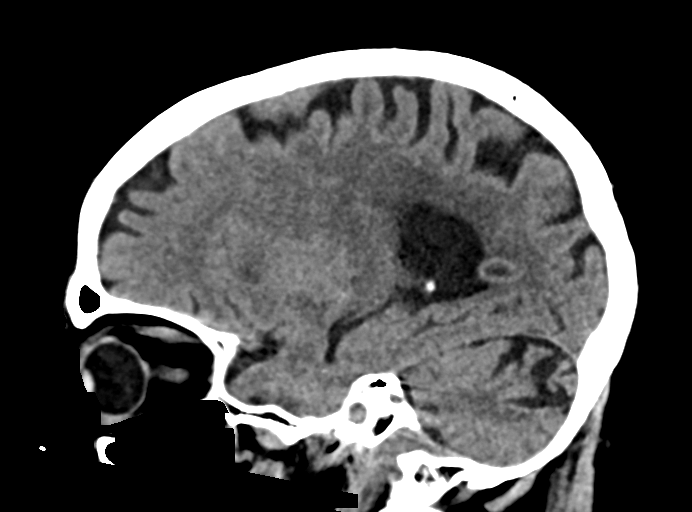

[Series 6: head bone · axial · 0.34mm/px · 1 of 81 slices shown]
[im 9/81  bone]
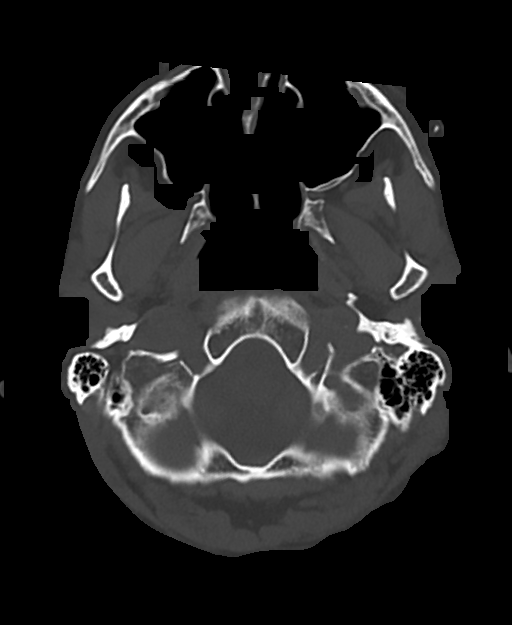

[14 of 47 positions shown; findings below may reference images not displayed]

FINDINGS: Brain: Acute left subdural hematoma is essentially unchanged in size
from earlier today, maximal dimension 12 mm in the parietal region,
series 3, image 11, 11 mm earlier today, and 14 mm yesterday. There
is trace subdural blood along the superior falx, unchanged from
earlier today. Minimal mass effect on the underlying high convexity.
Left-to-right midline shift of 5 mm, previously 6 mm. No
intraparenchymal or subarachnoid hemorrhage. No evidence of
developing ischemia. Remote bilateral cerebellar infarcts.

Vascular: Atherosclerosis of skullbase vasculature without
hyperdense vessel or abnormal calcification.

Skull: No fracture or focal lesion.

Sinuses/Orbits: No acute findings.

Other: None.
IMPRESSION: 1. Acute left subdural hematoma is essentially unchanged in size
from earlier today, maximal dimension 12 mm in the parietal region.
Left-to-right midline shift of 5 mm, previously 6 mm.
2. Small amount of subdural blood tracks along the superior falx,
unchanged.

## 2021-01-05 IMAGING — CT CT HEAD W/O CM
3 of 4 series · 13 of 47 positions shown, 15 images · non-contrast
Comparison: [DATE]

CLINICAL DATA: Recent subdural hematoma

EXAM:
CT HEAD WITHOUT CONTRAST
TECHNIQUE: Contiguous axial images were obtained from the base of the skull
through the vertex without intravenous contrast.

[Series 3: head without · axial · non-contrast · 0.46mm/px · z∈[+928,+1048]mm · 7 of 33 slices shown, 9 images]
[im 5/33  brain]
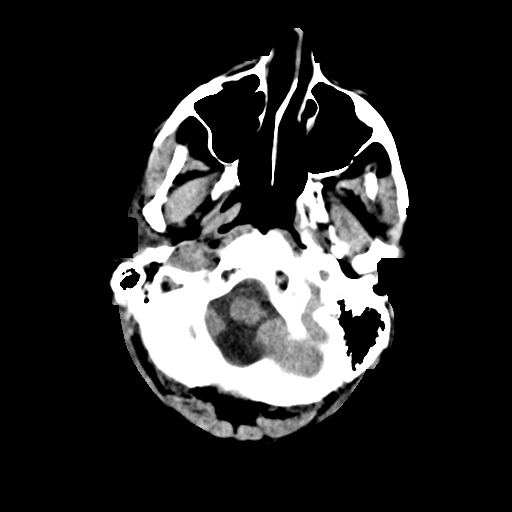
[im 5/33  bone]
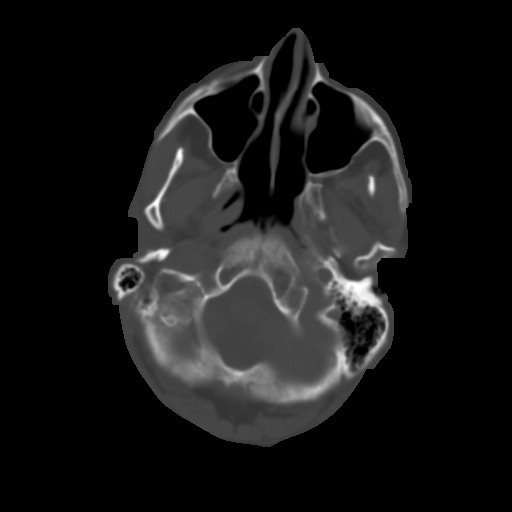
[im 9/33  brain]
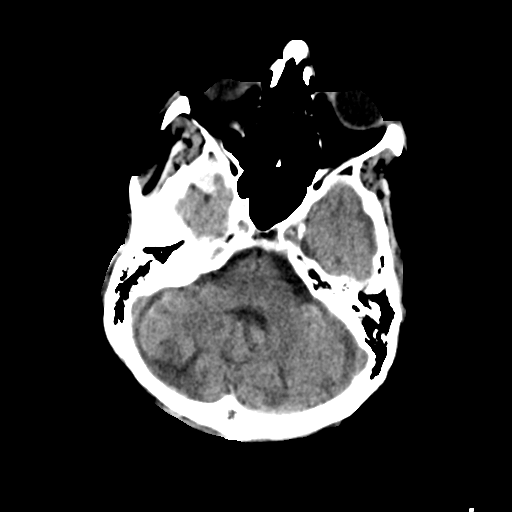
[im 13/33  brain]
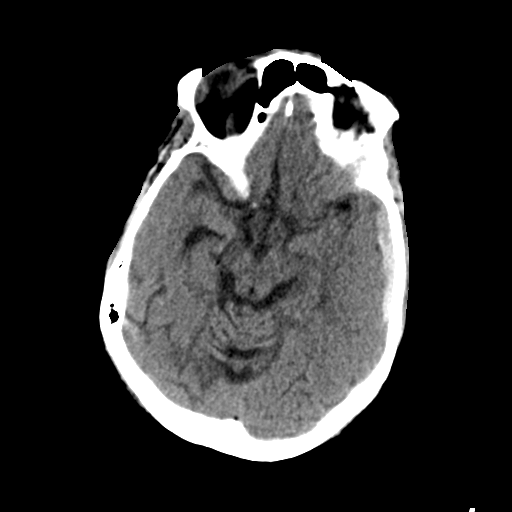
[im 17/33  brain]
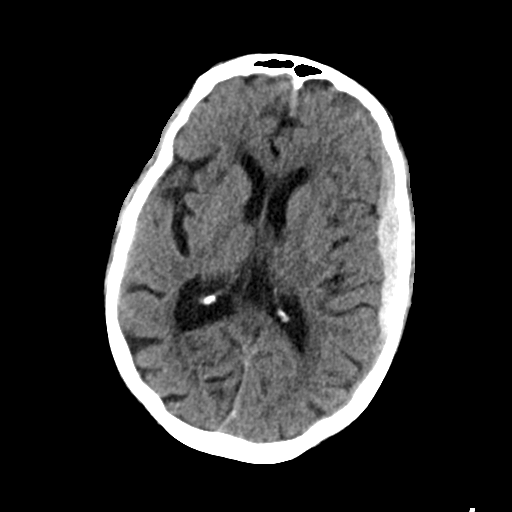
[im 21/33  brain]
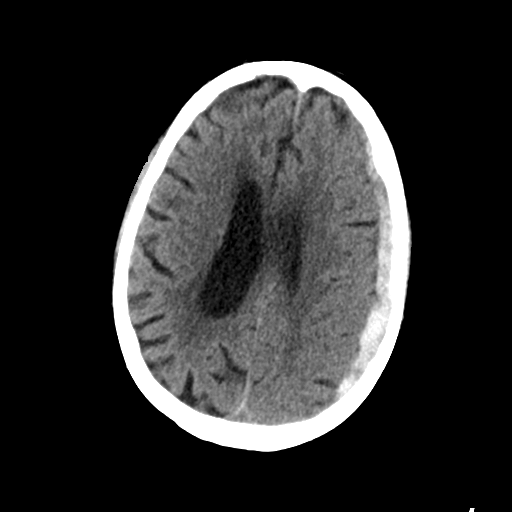
[im 21/33  bone]
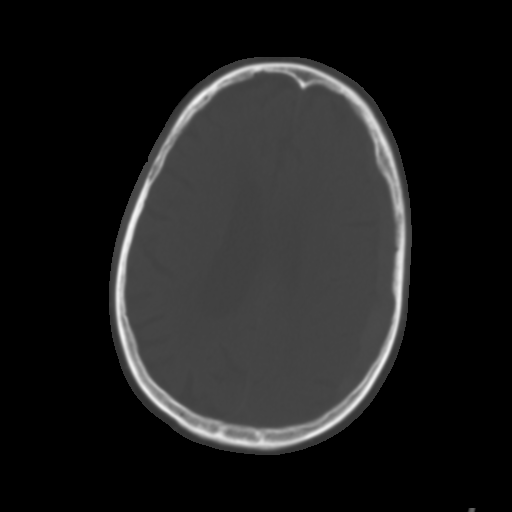
[im 25/33  brain]
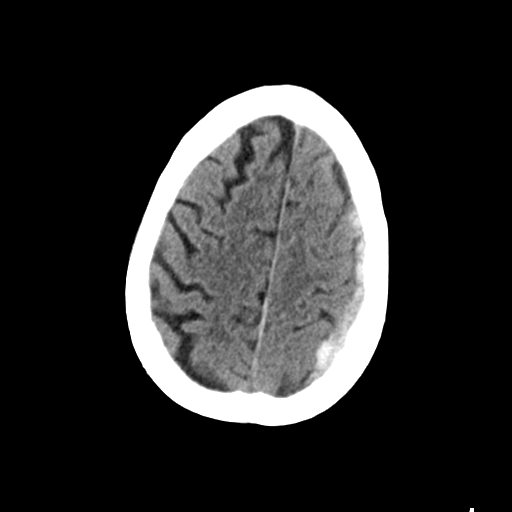
[im 29/33  brain]
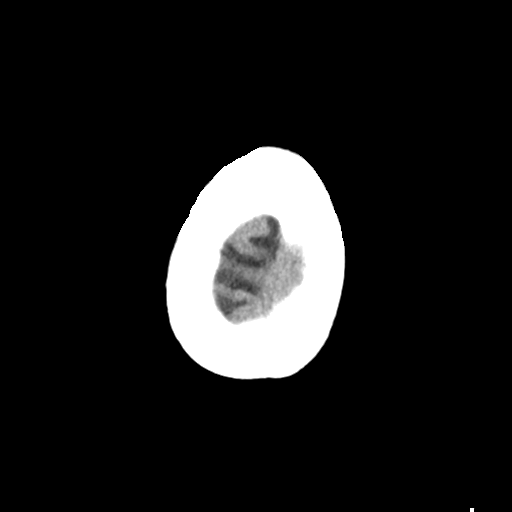

[Series 5: head without cor · coronal · non-contrast · 0.32mm/px · 3 of 72 slices shown]
[im 24/72  brain]
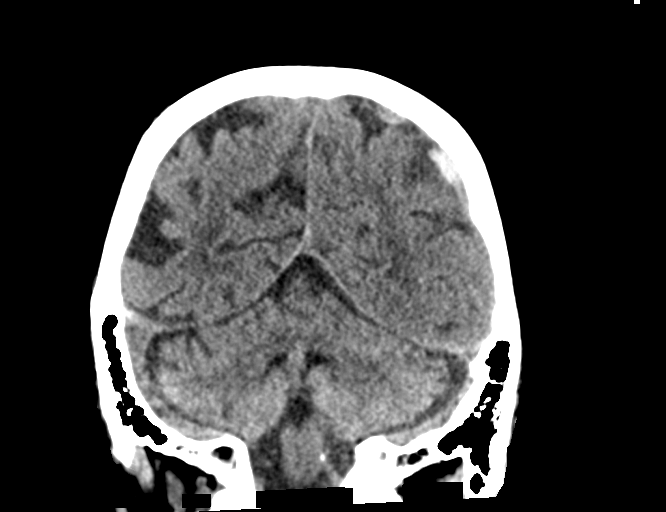
[im 32/72  brain]
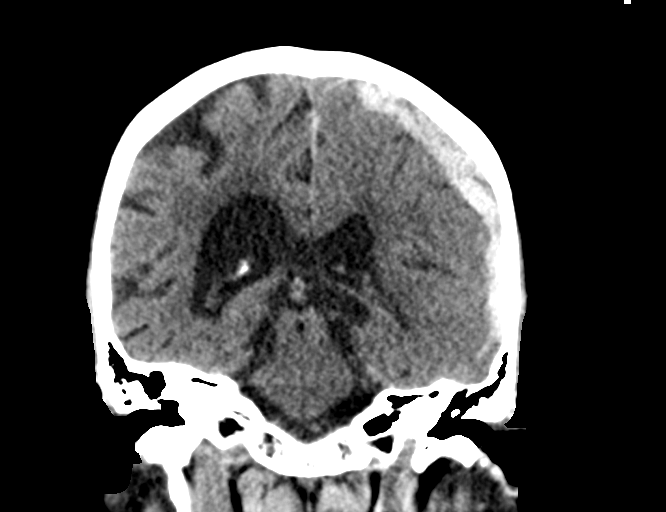
[im 40/72  brain]
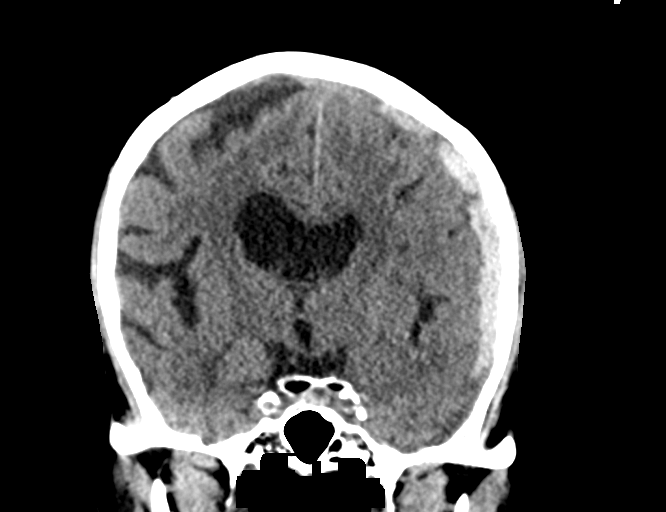

[Series 6: head without sag · sagittal · non-contrast · 0.32mm/px · 3 of 66 slices shown]
[im 22/66  brain]
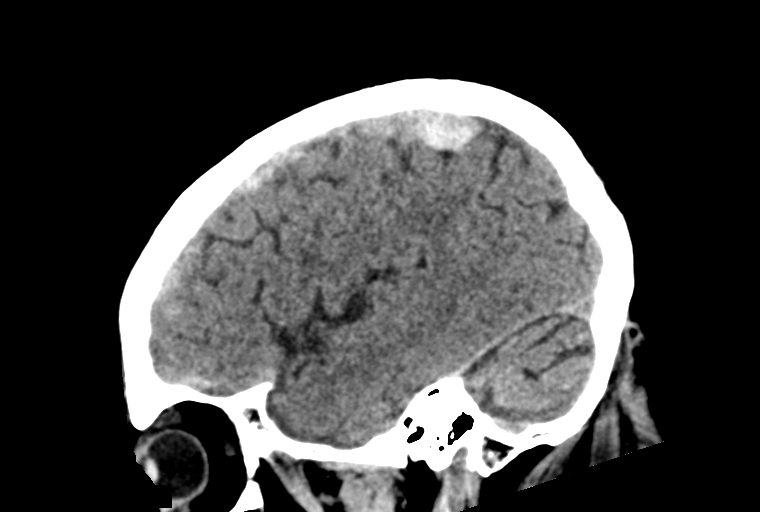
[im 33/66  brain]
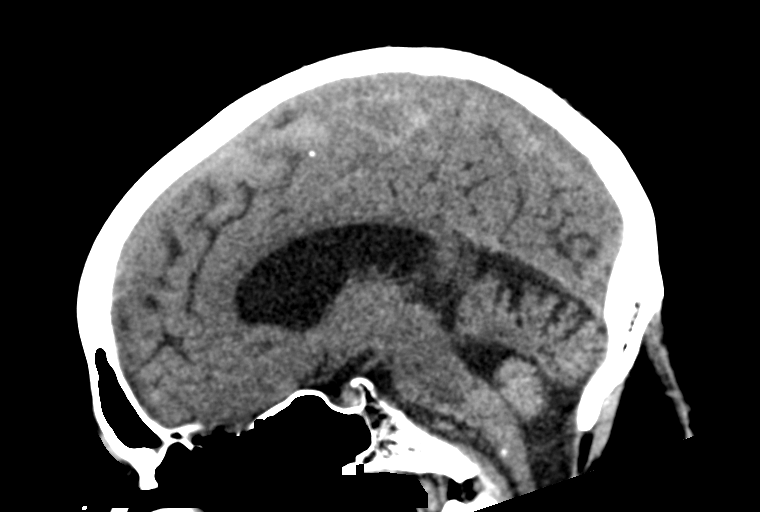
[im 44/66  brain]
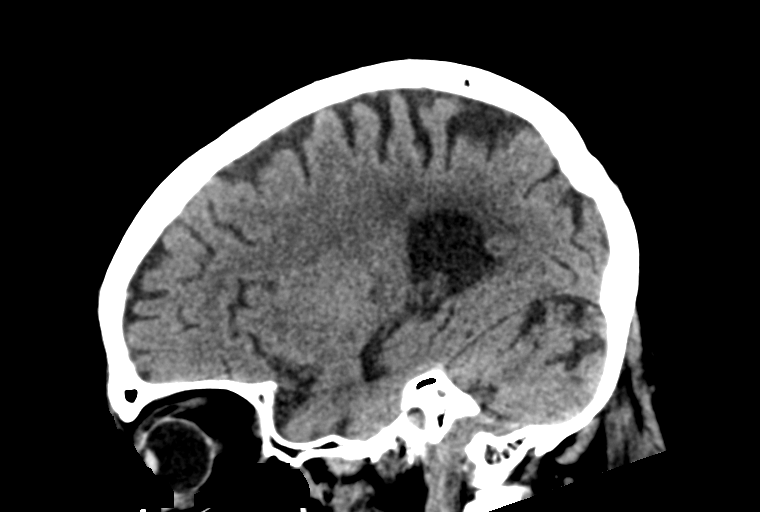

[13 of 47 positions shown; findings below may reference images not displayed]

FINDINGS: Brain: The previously noted acute subdural hematoma along the left
frontal and temporal lobes extending superiorly to the level of the
left convexity is again noted with a maximum thickness of 1.1 cm in
the left convexity region, marginally less thick than on the
previous study. Subdural size and contour elsewhere appears
essentially stable. There is stable mass effect on portions of the
left frontal and temporal lobes with 6 mm of midline shift to the
right, stable. No new or enlarging subdural hematoma evident. The
slight amount of subdural hematoma in the anterior falx is stable
without mass effect. There is no evident intra-axial mass or
hemorrhage. Mild diffuse atrophy with mild underlying
periventricular small vessel disease noted. Prior cerebellar
infarcts are stable, larger on the right than on the left. Note that
fourth ventricle is midline. No acute infarct evident.

Vascular: No evident hyperdense vessel. Calcification noted in each
carotid siphon region.

Skull: Bony calvarium appears intact.

Sinuses/Orbits: Stable opacification in several ethmoid air cells
and superior right maxillary antrum, stable. Orbits appear symmetric
bilaterally.

Other: Mastoid air cells clear
IMPRESSION: Left-sided subdural hematoma again noted, minimally smaller in the
region of the convexity and stable elsewhere. Degree of mass effect
stable with 6 mm of midline shift toward the right at the level of
the foramen of GESNER. Fourth ventricle midline.

Stable atrophy with periventricular small vessel disease. Prior
cerebellar infarcts appear stable. No acute infarct. No intra-axial
mass or hemorrhage.

There are foci of arterial vascular calcification. There are foci of
paranasal sinus disease.

## 2021-01-05 IMAGING — DX DG CHEST 1V PORT
1 series · 1 of 1 positions shown · non-contrast
Comparison: [DATE]

CLINICAL DATA: Shortness of breath

EXAM:
PORTABLE CHEST 1 VIEW

[chest]
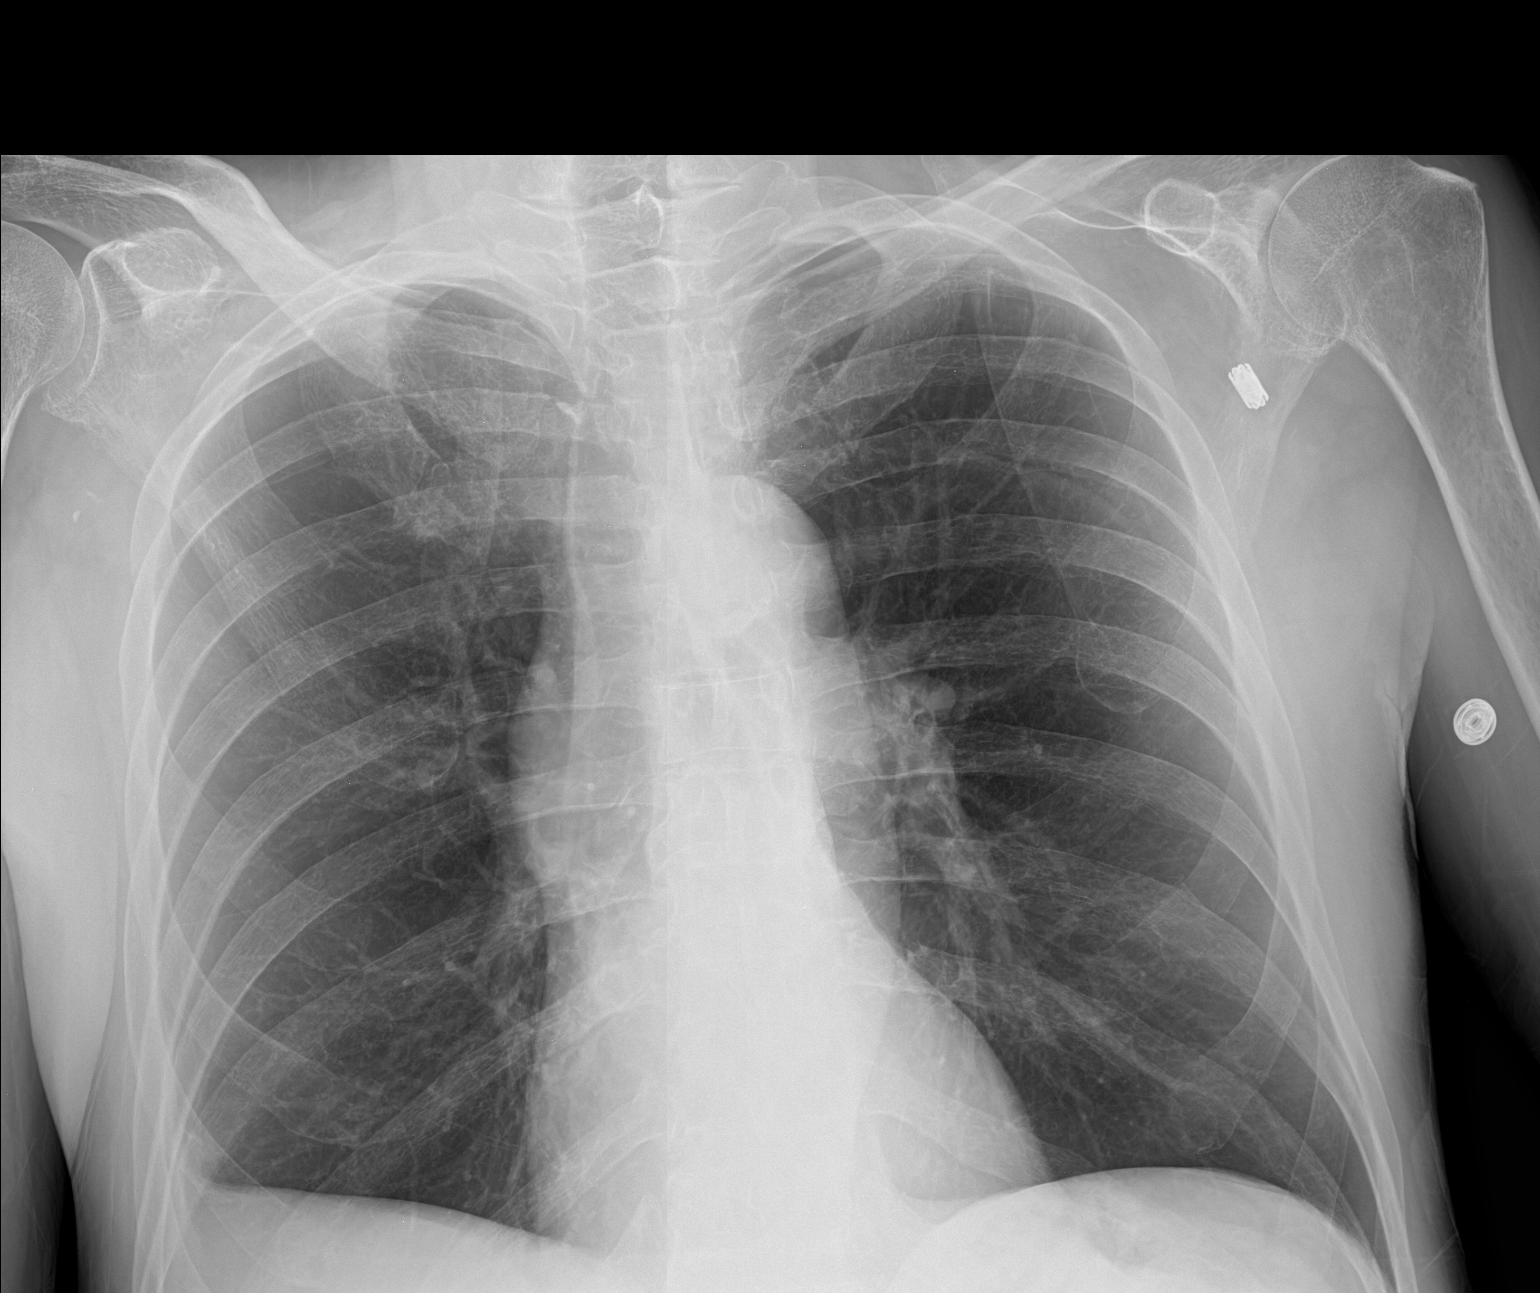

[1 of 1 positions shown; findings below may reference images not displayed]

FINDINGS: Heart and mediastinal contours are within normal limits. No focal
opacities or effusions. No acute bony abnormality.
IMPRESSION: No active disease.

## 2021-01-05 MED ORDER — METOPROLOL TARTRATE 25 MG PO TABS
50.0000 mg | ORAL_TABLET | Freq: Two times a day (BID) | ORAL | Status: DC
  Administered 2021-01-05 – 2021-01-06 (×2): 50 mg via ORAL
  Filled 2021-01-05 (×2): qty 1

## 2021-01-05 MED ORDER — LACTATED RINGERS IV SOLN: INTRAVENOUS | Status: DC

## 2021-01-05 MED ORDER — AMLODIPINE BESYLATE 10 MG PO TABS
10.0000 mg | ORAL_TABLET | Freq: Every day | ORAL | Status: DC
  Administered 2021-01-05 – 2021-01-07 (×3): 10 mg via ORAL
  Filled 2021-01-05 (×3): qty 1

## 2021-01-05 MED ORDER — METOPROLOL TARTRATE 100 MG PO TABS
100.0000 mg | ORAL_TABLET | Freq: Two times a day (BID) | ORAL | Status: DC
  Administered 2021-01-05: 100 mg via ORAL
  Filled 2021-01-05: qty 1

## 2021-01-05 MED ORDER — POTASSIUM PHOSPHATES 15 MMOLE/5ML IV SOLN
30.0000 mmol | Freq: Once | INTRAVENOUS | Status: AC
  Administered 2021-01-05: 30 mmol via INTRAVENOUS
  Filled 2021-01-05: qty 10

## 2021-01-05 MED ORDER — SODIUM CHLORIDE 0.9 % IV SOLN
3.0000 g | Freq: Four times a day (QID) | INTRAVENOUS | Status: DC
  Administered 2021-01-05 – 2021-01-07 (×7): 3 g via INTRAVENOUS
  Filled 2021-01-05 (×5): qty 8
  Filled 2021-01-05: qty 3
  Filled 2021-01-05 (×4): qty 8

## 2021-01-05 MED ORDER — IBUPROFEN 600 MG PO TABS
600.0000 mg | ORAL_TABLET | Freq: Once | ORAL | Status: AC
  Administered 2021-01-05: 600 mg via ORAL
  Filled 2021-01-05: qty 1

## 2021-01-05 NOTE — Plan of Care (Signed)
  Problem: Education: Goal: Knowledge of disease or condition will improve Outcome: Progressing   Problem: Activity: Goal: Ability to tolerate increased activity will improve Outcome: Progressing   

## 2021-01-05 NOTE — Care Management Important Message (Signed)
Important Message  Patient Details  Name: Seth [name] MRN: [id number] Date of Birth: April 27, 1956   Medicare Important Message Given:  Yes     [name] 01/05/2021, 2:11 PM

## 2021-01-05 NOTE — Progress Notes (Signed)
Called CT at 1036. CT said they would be up soon.

## 2021-01-05 NOTE — Progress Notes (Signed)
Pharmacy Antibiotic Note  Seth [name] is a 65 y.o. male admitted on 01/01/2021 with pneumonia.  Pharmacy has been consulted for Unasyn dosing.  Pt was admitted for AMS. Has SDH. Had a temp of 100.1. Wbc wnl. Unasyn ordered empirically for asp PNA.  Scr <1 CrCl 83 ml/min Plan: Unasyn 3g IV q6  Height: 6\' 3"  (190.5 cm) Weight: 62.4 kg (137 lb 9.1 oz) IBW/kg (Calculated) : 84.5  Temp (24hrs), Avg:99.6 F (37.6 C), Min:98.6 F (37 C), Max:101.5 F (38.6 C)  Recent Labs  Lab 01/01/21 2124 01/01/21 2134 01/01/21 2334 01/02/21 0300 01/03/21 0405 01/04/21 0314 01/04/21 2205 01/05/21 0010  WBC 6.1  --   --  5.5 6.6 6.0  --  6.4  CREATININE 1.51*  --   --  1.55* 0.81 0.69  --  0.78  LATICACIDVEN  --  2.8* 2.6*  --   --   --  3.0* 1.2    Estimated Creatinine Clearance: 83.4 mL/min (by C-G formula based on SCr of 0.78 mg/dL).    No Known Allergies  Antimicrobials this admission: 5/11 unasyn>>  Dose adjustments this admission:   Microbiology results: PCT<0.1  5/10 blood>>ngtd 5/7 blood>>ngtd  Onnie Boer, PharmD, [place], AAHIVP, CPP Infectious Disease Pharmacist 01/05/2021 5:05 PM

## 2021-01-05 NOTE — TOC Progression Note (Addendum)
Transition of Care [facility]) - Progression Note    Patient Details  Name: Seth [name] MRN: [id number] Date of Birth: [birth year]  [facility] CM/SW Contact  Joanne Chars, LCSW Phone Number: 01/05/2021, 2:07 PM  Clinical Narrative:  CSW spoke with pt about bed offers at [facility] and [place] and pt chose [place].  LM with [facility] to confirm bed for potential DC tomorrow.  1415: Bed confirmed with [facility].  1420: Auth received from [employer]: 5 days starting 5/12, review on 5/16 with Darnelle Bos.  Auth ID [id number].          Expected Discharge Plan and Services                                                 Social Determinants of Health (SDOH) Interventions    Readmission Risk Interventions Readmission Risk Prevention Plan 06/04/2019  Transportation Screening Complete  PCP or Specialist Appt within 5-7 Days Complete  Home Care Screening Complete  Medication Review (RN CM) Complete  Some recent data might be hidden

## 2021-01-05 NOTE — Plan of Care (Signed)

## 2021-01-05 NOTE — Progress Notes (Addendum)
PROGRESS NOTE                                                                                                                                                                                                             Patient Demographics:    Seth [name], is a 65 y.o. male, DOB - [birth year], GLO:[id number]  Outpatient Primary MD for the patient is Lucianne Lei, MD    LOS - 3  Admit date - 01/01/2021    Chief Complaint  Patient presents with  . Altered Mental Status       Brief Narrative (HPI from H&P)  - Seth [name] is a 65 y.o. male with medical history significant for history of CVA, PVD s/p LLE angiography with DES placed to popliteal artery, left foot osteomyelitis s/p transmetatarsal hypertension 09/22/2020, COPD, hypertension, alcohol use, and tobacco use who presents to the ED for evaluation of altered mental status, in the ER his work-up was consistent with dehydration, hypoglycemia and possible SIRS.   Subjective:   Patient in bed, appears comfortable, denies any headache, no fever, no chest pain or pressure, no shortness of breath , no abdominal pain. No new focal weakness.    Assessment  & Plan :     1. Acute mental status decline - likely on by alcohol abuse, poor oral intake causing hypoglycemia, hypomagnesemia, dehydration, AKI and hyperkalemia, with possible DTs. CT showing SDH.  He has been extensively counseled to quit alcohol intake, hydrate with IV fluids, replace magnesium, hyperkalemia has resolved, monitor CBGs closely, encourage oral intake, continue PT OT and monitor.  For his SDH CT scan x 2 done, case discussed with Dr. Saintclair Halsted neurosurgery, for now no surgical intervention, hold off Plavix and monitor clinically.  Currently he is symptom-free. Repeat 3rd CT 01/05/21, if stable DC 01/06/21.  2.  SIRS.  Likely due to dehydration no source of infection, hydrate with IV fluids and monitor.  3.   History of CVA.  Supportive care continue ASA with statin.  4.  Macrocytic anemia.  Borderline B12 levels, placed on replacement, type screen and monitor.  5.  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  Supportive care. Was on DAPT with statin.  Case discussed with his vascular surgeon Dr. Donzetta Matters on  04/2021, continue aspirin and statin for now.  Can hold Plavix indefinitely if needed for SDH.  6.  Alcohol abuse.  Counseled to quit last drink on 12/27/2020 ?,  He is slightly more confused on 01/03/2021 and could be going into early DTs, will monitor on CIWA protocol.  7.  COPD.  Stable on supportive care.  8.  Ongoing history of smoking.  Counseled to quit on NicoDerm patch.  9. Hypokalemia - replaced.  10. Addendum 4.45 pm - per RN mild fever temp 110.1, mild confusion, no deficits, had low grade Temp early am too, CXR, UA stable, clinically risk for aspiration, CT head repeat, Switch to soft diet, SLP eval, Bladder scan < 200cc, B.Cult monitor, Tylenol now and monitor.        Condition - Extremely Guarded  Family Communication  : Sister [phone] [name] 01/03/21  Code Status :  Full  Consults  :  Nuerosurgery Dr Saintclair Halsted    PUD Prophylaxis : PPI   Procedures  :     CT Head - Non Acute x 2      Disposition Plan  :    Status is:   Dispo: The patient is from: Home              Anticipated d/c is to: Home              Patient currently is not medically stable to d/c.   Difficult to place patient No   DVT Prophylaxis  :    Place and maintain sequential compression device Start: 01/03/21 1148 Place TED hose Start: 01/03/21 0658    Lab Results  Component Value Date   PLT 119 (L) 01/05/2021    Diet :  Diet Order            Diet regular Room service appropriate? Yes; Fluid consistency: Thin  Diet effective now                  Inpatient Medications  Scheduled Meds: .  amLODipine  10 mg Oral Daily  . aspirin  81 mg Oral Daily  . feeding supplement  1 Container Oral TID BM  . ferrous sulfate  325 mg Oral BID WC  . finasteride  5 mg Oral Daily  . fluticasone furoate-vilanterol  1 puff Inhalation Daily  . folic acid  1 mg Oral Daily  . metoprolol tartrate  100 mg Oral BID  . multivitamin with minerals  1 tablet Oral Daily  . nicotine  21 mg Transdermal Daily  . rosuvastatin  10 mg Oral Daily  . thiamine  100 mg Oral Daily  . [START ON 01/06/2021] vitamin B-12  1,000 mcg Oral Daily   Continuous Infusions: . potassium PHOSPHATE IVPB (in mmol) 30 mmol (01/05/21 0956)   PRN Meds:.acetaminophen **OR** [DISCONTINUED] acetaminophen, albuterol, cloNIDine, dextrose, guaiFENesin, LORazepam **OR** LORazepam, [DISCONTINUED] ondansetron **OR** ondansetron (ZOFRAN) IV  Antibiotics  :    Anti-infectives (From admission, onward)   Start     Dose/Rate Route Frequency Ordered Stop   01/01/21 2330  vancomycin (VANCOREADY) IVPB 1250 mg/250 mL        1,250 mg 166.7 mL/hr over 90 Minutes Intravenous  Once 01/01/21 2302 01/02/21 0301   01/01/21 2315  piperacillin-tazobactam (ZOSYN) IVPB 3.375 g        3.375 g 12.5 mL/hr over 240 Minutes Intravenous  Once 01/01/21 2302 01/02/21 0404       Time Spent in minutes  30   [name] M.D on  01/05/2021 at 10:33 AM  To page go to [email or web address]   [facility] -  Office  [phone]    See all Orders from today for further details    Objective:   Vitals:   01/04/21 2121 01/04/21 2240 01/05/21 0517 01/05/21 0831  BP: 135/75 (!) 156/97 (!) 162/83 (!) 155/95  Pulse: (!) 116 95 (!) 101 100  Resp: 20 (!) 22 18 16   Temp: 100 F (37.8 C) 99.4 F (37.4 C) 98.8 F (37.1 C) 98.8 F (37.1 C)  TempSrc: Oral Oral Oral Oral  SpO2: 100% 100% 100%   Weight:      Height:        Wt Readings from Last 3 Encounters:  01/02/21 62.4 kg  10/04/20 67.1 kg  09/27/20 67.3 kg     Intake/Output Summary (Last 24 hours)  at 01/05/2021 1033 Last data filed at 01/05/2021 0913 Gross per 24 hour  Intake 240 ml  Output 2250 ml  Net -2010 ml     Physical Exam  Awake Alert, No new F.N deficits, Normal affect [place].AT,PERRAL Supple Neck,No JVD, No cervical lymphadenopathy appriciated.  Symmetrical Chest wall movement, Good air movement bilaterally, CTAB RRR,No Gallops, Rubs or new Murmurs, No Parasternal Heave +ve B.Sounds, Abd Soft, No tenderness, No organomegaly appriciated, No rebound - guarding or rigidity. 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     Data Review:    CBC Recent Labs  Lab 01/01/21 2124 01/02/21 0300 01/03/21 0405 01/04/21 0314 01/05/21 0010  WBC 6.1 5.5 6.6 6.0 6.4  HGB 8.6* 7.2* 8.1* 7.7* 7.6*  HCT 27.4* 21.4* 23.7* 23.0* 23.2*  PLT 127* 97* 93* 91* 119*  MCV 106.6* 100.5* 99.6 101.3* 103.1*  MCH 33.5 33.8 34.0 33.9 33.8  MCHC 31.4 33.6 34.2 33.5 32.8  RDW 17.9* 17.1* 17.0* 17.2* 17.3*  LYMPHSABS 0.6*  --  1.2 1.3 1.5  MONOABS 0.6  --  0.8 0.9 1.5*  EOSABS 0.0  --  0.0 0.0 0.1  BASOSABS 0.0  --  0.0 0.0 0.0    Recent Labs  Lab 01/01/21 2124 01/01/21 2134 01/01/21 2334 01/02/21 0300 01/02/21 1226 01/03/21 0405 01/04/21 0314 01/04/21 1401 01/04/21 2205 01/05/21 0010 01/05/21 0758  NA 135  --   --  136  --  135 134*  --   --  134*  --   K 5.6*  --   --  4.1  --  2.9* 2.7* 3.7  --  3.6  --   CL 107  --   --  100  --  98 100  --   --  103  --   CO2 10*  --   --  18*  --  27 27  --   --  26  --   GLUCOSE 64*  --   --  82  --  120* 102*  --   --  97  --   BUN 14  --   --  13  --  6* <5*  --   --  7*  --   CREATININE 1.51*  --   --  1.55*  --  0.81 0.69  --   --  0.78  --   CALCIUM 6.6*  --   --  8.3*  --  8.8* 8.3*  --   --  8.7*  --   AST 46*  --   --  40  --  44* 33  --   --  25  --  ALT 14  --   --  16  --  17 15  --   --  12  --   ALKPHOS 65  --   --  66  --  68 72  --   --  70  --   BILITOT 3.2*  --   --  2.6*  --  1.0 1.1  --   --  0.8  --    ALBUMIN 2.7*  --   --  2.8*  --  3.0* 2.9*  --   --  2.8*  --   MG  --   --   --  1.4*  --  1.6* 2.2  --   --  2.0  --   PROCALCITON  --   --   --   --  0.34 0.21 <0.10  --  <0.10  --  <0.10  LATICACIDVEN  --  2.8* 2.6*  --   --   --   --   --  3.0* 1.2  --   INR 0.9  --   --   --   --   --   --   --   --   --   --   BNP  --   --   --   --   --  244.9* 204.5*  --   --  162.8*  --     ------------------------------------------------------------------------------------------------------------------ No results for input(s): CHOL, HDL, LDLCALC, TRIG, CHOLHDL, LDLDIRECT in the last 72 hours.  Lab Results  Component Value Date   HGBA1C 4.2 (L) 07/20/2020   ------------------------------------------------------------------------------------------------------------------ No results for input(s): TSH, T4TOTAL, T3FREE, THYROIDAB in the last 72 hours.  Invalid input(s): FREET3  Cardiac Enzymes No results for input(s): CKMB, TROPONINI, MYOGLOBIN in the last 168 hours.  Invalid input(s): CK ------------------------------------------------------------------------------------------------------------------    Component Value Date/Time   BNP 162.8 (H) 01/05/2021 0010    Micro Results Recent Results (from the past 240 hour(s))  Culture, blood (Routine x 2)     Status: None (Preliminary result)   Collection Time: 01/01/21 10:04 PM   Specimen: BLOOD RIGHT HAND  Result Value Ref Range Status   Specimen Description BLOOD RIGHT HAND  Final   Special Requests   Final    BOTTLES DRAWN AEROBIC ONLY Blood Culture results may not be optimal due to an inadequate volume of blood received in culture bottles   Culture   Final    NO GROWTH 4 DAYS Performed at [facility], [place] [address] [place], [place] [address]    Report Status PENDING  Incomplete  Resp Panel by RT-PCR (Flu A&B, Covid) Nasopharyngeal Swab     Status: None   Collection Time: 01/01/21 10:09 PM   Specimen: Nasopharyngeal Swab;  Nasopharyngeal(NP) swabs in vial transport medium  Result Value Ref Range Status   SARS Coronavirus 2 by RT PCR NEGATIVE NEGATIVE Final    Comment: (NOTE) SARS-CoV-2 target nucleic acids are NOT DETECTED.  The SARS-CoV-2 RNA is generally detectable in upper respiratory specimens during the acute phase of infection. The lowest concentration of SARS-CoV-2 viral copies this assay can detect is 138 copies/mL. A negative result does not preclude SARS-Cov-2 infection and should not be used as the sole basis for treatment or other patient management decisions. A negative result may occur with  improper specimen collection/handling, submission of specimen other than nasopharyngeal swab, presence of viral mutation(s) within the areas targeted by this assay, and inadequate number of viral copies(<138 copies/mL).  A negative result must be combined with clinical observations, patient history, and epidemiological information. The expected result is Negative.  Fact Sheet for Patients:  EntrepreneurPulse.com.au  Fact Sheet for Healthcare Providers:  IncredibleEmployment.be  This test is no t yet approved or cleared by the [place] FDA and  has been authorized for detection and/or diagnosis of SARS-CoV-2 by FDA under an Emergency Use Authorization (EUA). This EUA will remain  in effect (meaning this test can be used) for the duration of the COVID-19 declaration under Section 564(b)(1) of the Act, 21 U.S.C.section 360bbb-3(b)(1), unless the authorization is terminated  or revoked sooner.       Influenza A by PCR NEGATIVE NEGATIVE Final   Influenza B by PCR NEGATIVE NEGATIVE Final    Comment: (NOTE) The Xpert Xpress SARS-CoV-2/FLU/RSV plus assay is intended as an aid in the diagnosis of influenza from Nasopharyngeal swab specimens and should not be used as a sole basis for treatment. Nasal washings and aspirates are unacceptable for Xpert Xpress  SARS-CoV-2/FLU/RSV testing.  Fact Sheet for Patients: EntrepreneurPulse.com.au  Fact Sheet for Healthcare Providers: IncredibleEmployment.be  This test is not yet approved or cleared by the [place] FDA and has been authorized for detection and/or diagnosis of SARS-CoV-2 by FDA under an Emergency Use Authorization (EUA). This EUA will remain in effect (meaning this test can be used) for the duration of the COVID-19 declaration under Section 564(b)(1) of the Act, 21 U.S.C. section 360bbb-3(b)(1), unless the authorization is terminated or revoked.  Performed at [facility], [place] [address] [place], [place] [address]   Culture, blood (Routine x 2)     Status: None (Preliminary result)   Collection Time: 01/01/21 11:34 PM   Specimen: BLOOD LEFT ARM  Result Value Ref Range Status   Specimen Description BLOOD LEFT ARM  Final   Special Requests   Final    BOTTLES DRAWN AEROBIC AND ANAEROBIC Blood Culture adequate volume   Culture   Final    NO GROWTH 3 DAYS Performed at [facility], [place] [address] [place], [place] [address]    Report Status PENDING  Incomplete  Culture, blood (routine x 2)     Status: None (Preliminary result)   Collection Time: 01/04/21 10:00 PM   Specimen: BLOOD RIGHT HAND  Result Value Ref Range Status   Specimen Description BLOOD RIGHT HAND  Final   Special Requests   Final    BOTTLES DRAWN AEROBIC AND ANAEROBIC Blood Culture adequate volume   Culture   Final    NO GROWTH < 12 HOURS Performed at [facility], [place] [address] [place], [place] [address]    Report Status PENDING  Incomplete  Culture, blood (routine x 2)     Status: None (Preliminary result)   Collection Time: 01/04/21 10:09 PM   Specimen: BLOOD RIGHT HAND  Result Value Ref Range Status   Specimen Description BLOOD RIGHT HAND  Final   Special Requests   Final    AEROBIC BOTTLE ONLY Blood Culture results may not be optimal due to  an inadequate volume of blood received in culture bottles   Culture   Final    NO GROWTH < 12 HOURS Performed at [facility], [place] [address], [place], [place] [address]    Report Status PENDING  Incomplete    Radiology Reports  DG Chest 2 View  Result Date: 01/01/2021 CLINICAL DATA:  Altered mental status and possible sepsis EXAM: CHEST - 2 VIEW  COMPARISON:  09/19/2020 FINDINGS: The heart size and mediastinal contours are within normal limits. Both lungs are clear. The visualized skeletal structures are unremarkable. IMPRESSION: No active cardiopulmonary disease. Electronically Signed   By: Inez Catalina M.D.   On: 01/01/2021 21:44   CT HEAD WO CONTRAST  Result Date: 01/04/2021 CLINICAL DATA:  Subdural hematoma EXAM: CT HEAD WITHOUT CONTRAST TECHNIQUE: Contiguous axial images were obtained from the base of the skull through the vertex without intravenous contrast. COMPARISON:  Jan 03, 2021 FINDINGS: Brain: There is an acute appearing subdural hematoma again noted along the left frontal and temporal lobes extending superiorly to the level of the left convexity. The maximum thickness of this subdural hematoma in the left convexity region is 1.4 cm, stable by remeasurement. There is stable mass effect on portions of the left frontal, temporal, anterior parietal lobes. At the level of the foramen of Monro, there is 6 mm of midline shift toward the right, slightly increased compared to 1 day prior. There is subdural hematoma tracking along the falx with slightly more hemorrhage in this area compared to previous study. There is no appreciable mass effect along the falx, however. There is no intra-axial hemorrhage or mass. There is underlying mild diffuse atrophy with mild periventricular small vessel disease. There are small prior infarcts in the cerebellum bilaterally. No acute infarct is appreciable. Vascular: No hyperdense vessel. There is calcification in each carotid siphon region. Skull: The bony  calvarium appears intact. Sinuses/Orbits: There is opacification in several ethmoid air cells as well as in the anterior superior right maxillary antrum. Visualized orbits appear symmetric bilaterally. Other: Mastoid air cells are clear. IMPRESSION: Subdural hematoma on the left with essentially stable size and contour compared to the previous study. There remains mass effect on portions of the left frontal, left temporal, anterior left parietal lobes. There is marginally more midline shift to the right compared to 1 day prior. There is slightly more acute subdural hematoma in the falx without appreciable mass effect in the falcine region. There is no appreciable intra-axial hemorrhage. There is mild atrophy with mild periventricular small vessel disease. Small chronic appearing infarcts in the cerebellum, stable. No evident acute infarct. Foci of arterial vascular calcification noted. Areas of paranasal sinus disease noted. Electronically Signed   By: Lowella Grip III M.D.   On: 01/04/2021 08:01   CT HEAD WO CONTRAST  Result Date: 01/03/2021 CLINICAL DATA:  Delirium EXAM: CT HEAD WITHOUT CONTRAST TECHNIQUE: Contiguous axial images were obtained from the base of the skull through the vertex without intravenous contrast. COMPARISON:  09/14/2020 FINDINGS: Brain: Hyperdense subdural hemorrhage is present along the left cerebral convexity measuring up to 9 mm thickness. Small volume hyperdense subdural hemorrhage is also present along the falx. Mass effect is mild with 4 mm of rightward midline shift at the foramen of Monro. 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 Gray-white differentiation is preserved. Prominence of the ventricles and sulci reflects generalized parenchymal volume loss. Chronic bilateral cerebellar infarcts. Patchy hypoattenuation in the supratentorial white matter is nonspecific but probably reflects stable chronic microvascular ischemic changes. Vascular: There is  intracranial atherosclerotic calcification at the skull base Skull: Unremarkable. Sinuses/Orbits: Patchy paranasal sinus mucosal thickening. No acute orbital abnormality. Other: Mastoid air cells are clear. IMPRESSION: Acute left cerebral convexity subdural hemorrhage. Minimal additional subdural hemorrhage along the falx. Mass effect is mild with minor rightward midline shift. Stable chronic findings detailed above. These results were called by telephone at the  time of interpretation on 01/03/2021 at 11:40 am to provider [facility] , who verbally acknowledged these results. Electronically Signed   By: Macy Mis M.D.   On: 01/03/2021 11:44   DG Chest Port 1 View  Result Date: 01/05/2021 CLINICAL DATA:  Shortness of breath EXAM: PORTABLE CHEST 1 VIEW COMPARISON:  01/04/2021 FINDINGS: Heart and mediastinal contours are within normal limits. No focal opacities or effusions. No acute bony abnormality. IMPRESSION: No active disease. Electronically Signed   By: Rolm Baptise M.D.   On: 01/05/2021 08:28   DG CHEST PORT 1 VIEW  Result Date: 01/04/2021 CLINICAL DATA:  Cough and fevers EXAM: PORTABLE CHEST 1 VIEW COMPARISON:  01/01/2021, 09/19/2020 FINDINGS: Cardiac shadow is within normal limits. The lungs are well aerated bilaterally. Chronic blunting of the right costophrenic angle is noted stable from prior exams. No focal infiltrate is seen. No bony abnormality is noted. IMPRESSION: No active disease. Electronically Signed   By: Inez Catalina M.D.   On: 01/04/2021 22:35

## 2021-01-05 NOTE — Progress Notes (Addendum)
Walked in to check on Pt drooling and showing confusion talking to his "mom" on the phone and recording is being played. No new unilateral deficits no facial droop, no slurred speech. Only alert to self. Following minimal commands. Notified Camera operator, pt stable. VSS. Will notify Dr. Candiss Norse.

## 2021-01-06 ENCOUNTER — Inpatient Hospital Stay (HOSPITAL_COMMUNITY): Payer: Medicare Other

## 2021-01-06 DIAGNOSIS — R569 Unspecified convulsions: Secondary | ICD-10-CM | POA: Diagnosis not present

## 2021-01-06 DIAGNOSIS — R6510 Systemic inflammatory response syndrome (SIRS) of non-infectious origin without acute organ dysfunction: Secondary | ICD-10-CM | POA: Diagnosis not present

## 2021-01-06 DIAGNOSIS — I472 Ventricular tachycardia: Secondary | ICD-10-CM | POA: Diagnosis not present

## 2021-01-06 DIAGNOSIS — I4729 Other ventricular tachycardia: Secondary | ICD-10-CM

## 2021-01-06 LAB — CBC WITH DIFFERENTIAL/PLATELET
Abs Immature Granulocytes: 0.03 10*3/uL (ref 0.00–0.07)
Basophils Absolute: 0 10*3/uL (ref 0.0–0.1)
Basophils Relative: 0 %
Eosinophils Absolute: 0.1 10*3/uL (ref 0.0–0.5)
Eosinophils Relative: 2 %
HCT: 20.3 % — ABNORMAL LOW (ref 39.0–52.0)
Hemoglobin: 6.9 g/dL — CL (ref 13.0–17.0)
Immature Granulocytes: 1 %
Lymphocytes Relative: 24 %
Lymphs Abs: 1.5 10*3/uL (ref 0.7–4.0)
MCH: 34 pg (ref 26.0–34.0)
MCHC: 34 g/dL (ref 30.0–36.0)
MCV: 100 fL (ref 80.0–100.0)
Monocytes Absolute: 1.6 10*3/uL — ABNORMAL HIGH (ref 0.1–1.0)
Monocytes Relative: 25 %
Neutro Abs: 3.1 10*3/uL (ref 1.7–7.7)
Neutrophils Relative %: 48 %
Platelets: 143 10*3/uL — ABNORMAL LOW (ref 150–400)
RBC: 2.03 MIL/uL — ABNORMAL LOW (ref 4.22–5.81)
RDW: 16.9 % — ABNORMAL HIGH (ref 11.5–15.5)
WBC: 6.4 10*3/uL (ref 4.0–10.5)
nRBC: 0.5 % — ABNORMAL HIGH (ref 0.0–0.2)

## 2021-01-06 LAB — COMPREHENSIVE METABOLIC PANEL
ALT: 12 U/L (ref 0–44)
AST: 19 U/L (ref 15–41)
Albumin: 2.6 g/dL — ABNORMAL LOW (ref 3.5–5.0)
Alkaline Phosphatase: 60 U/L (ref 38–126)
Anion gap: 8 (ref 5–15)
BUN: 7 mg/dL — ABNORMAL LOW (ref 8–23)
CO2: 23 mmol/L (ref 22–32)
Calcium: 8.6 mg/dL — ABNORMAL LOW (ref 8.9–10.3)
Chloride: 100 mmol/L (ref 98–111)
Creatinine, Ser: 0.79 mg/dL (ref 0.61–1.24)
GFR, Estimated: >60 mL/min (ref >60)
Glucose, Bld: 93 mg/dL (ref 70–99)
Potassium: 3.7 mmol/L (ref 3.5–5.1)
Sodium: 131 mmol/L — ABNORMAL LOW (ref 135–145)
Total Bilirubin: 0.7 mg/dL (ref 0.3–1.2)
Total Protein: 5.5 g/dL — ABNORMAL LOW (ref 6.5–8.1)

## 2021-01-06 LAB — CBC
HCT: 26.4 % — ABNORMAL LOW (ref 39.0–52.0)
Hemoglobin: 8.9 g/dL — ABNORMAL LOW (ref 13.0–17.0)
MCH: 33.7 pg (ref 26.0–34.0)
MCHC: 33.7 g/dL (ref 30.0–36.0)
MCV: 100 fL (ref 80.0–100.0)
Platelets: 213 10*3/uL (ref 150–400)
RBC: 2.64 MIL/uL — ABNORMAL LOW (ref 4.22–5.81)
RDW: 17.1 % — ABNORMAL HIGH (ref 11.5–15.5)
WBC: 6.9 10*3/uL (ref 4.0–10.5)
nRBC: 0.3 % — ABNORMAL HIGH (ref 0.0–0.2)

## 2021-01-06 LAB — GLUCOSE, CAPILLARY
Glucose-Capillary: 213 mg/dL — ABNORMAL HIGH (ref 70–99)
Glucose-Capillary: 88 mg/dL (ref 70–99)
Glucose-Capillary: 90 mg/dL (ref 70–99)

## 2021-01-06 LAB — CULTURE, BLOOD (ROUTINE X 2): Culture: NO GROWTH

## 2021-01-06 LAB — BASIC METABOLIC PANEL
Anion gap: 8 (ref 5–15)
BUN: 7 mg/dL — ABNORMAL LOW (ref 8–23)
CO2: 24 mmol/L (ref 22–32)
Calcium: 9.1 mg/dL (ref 8.9–10.3)
Chloride: 101 mmol/L (ref 98–111)
Creatinine, Ser: 0.76 mg/dL (ref 0.61–1.24)
GFR, Estimated: >60 mL/min (ref >60)
Glucose, Bld: 96 mg/dL (ref 70–99)
Potassium: 3.9 mmol/L (ref 3.5–5.1)
Sodium: 133 mmol/L — ABNORMAL LOW (ref 135–145)

## 2021-01-06 LAB — TROPONIN I (HIGH SENSITIVITY)
Troponin I (High Sensitivity): 6 ng/L (ref <18)
Troponin I (High Sensitivity): 7 ng/L (ref <18)

## 2021-01-06 LAB — PHOSPHORUS
Phosphorus: 3.7 mg/dL (ref 2.5–4.6)
Phosphorus: 3.8 mg/dL (ref 2.5–4.6)

## 2021-01-06 LAB — PROCALCITONIN: Procalcitonin: <0.1 ng/mL

## 2021-01-06 LAB — MAGNESIUM: Magnesium: 1.7 mg/dL (ref 1.7–2.4)

## 2021-01-06 LAB — PREPARE RBC (CROSSMATCH)

## 2021-01-06 LAB — BRAIN NATRIURETIC PEPTIDE: B Natriuretic Peptide: 308.1 pg/mL — ABNORMAL HIGH (ref 0.0–100.0)

## 2021-01-06 IMAGING — DX DG CHEST 1V PORT
1 series · 2 of 2 positions shown · non-contrast
Comparison: [DATE]

CLINICAL DATA: Shortness of breath, altered mental status

EXAM:
PORTABLE CHEST 1 VIEW

[Series 1: chest ap · 0.14mm/px · 2 of 2 slices shown]
[im 1/2]
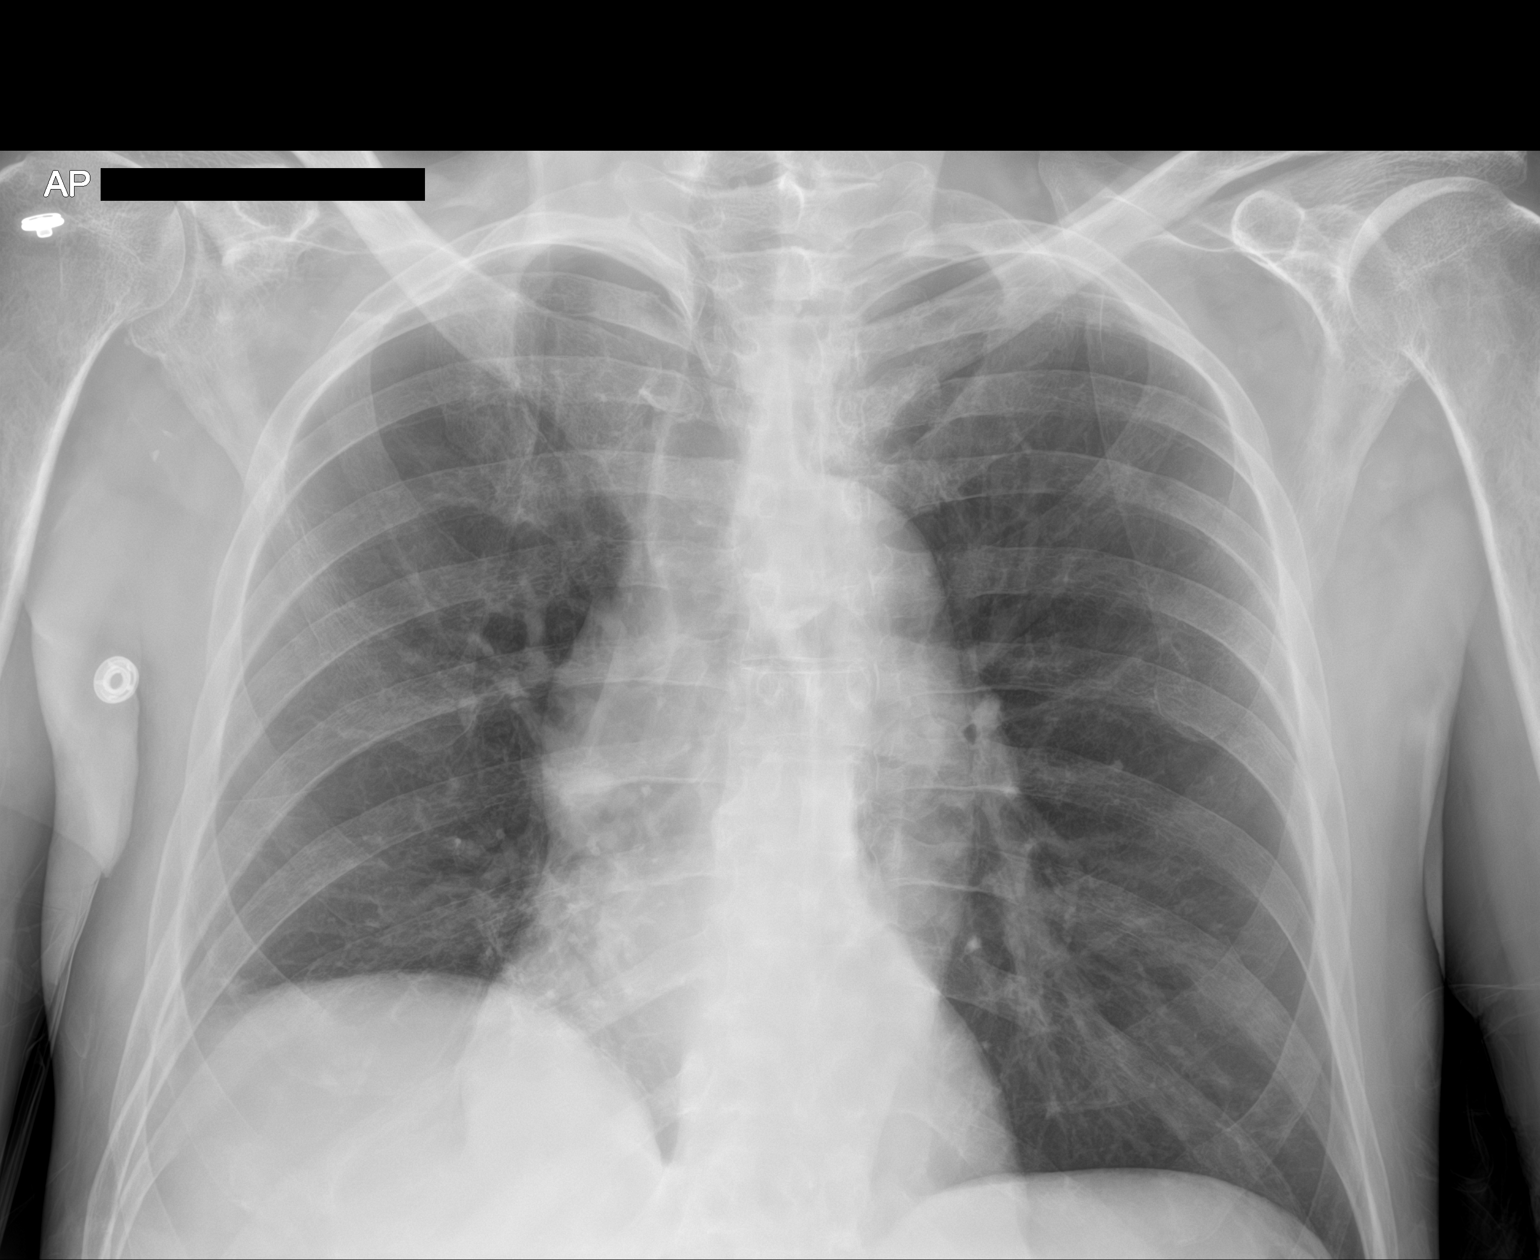
[im 2/2]
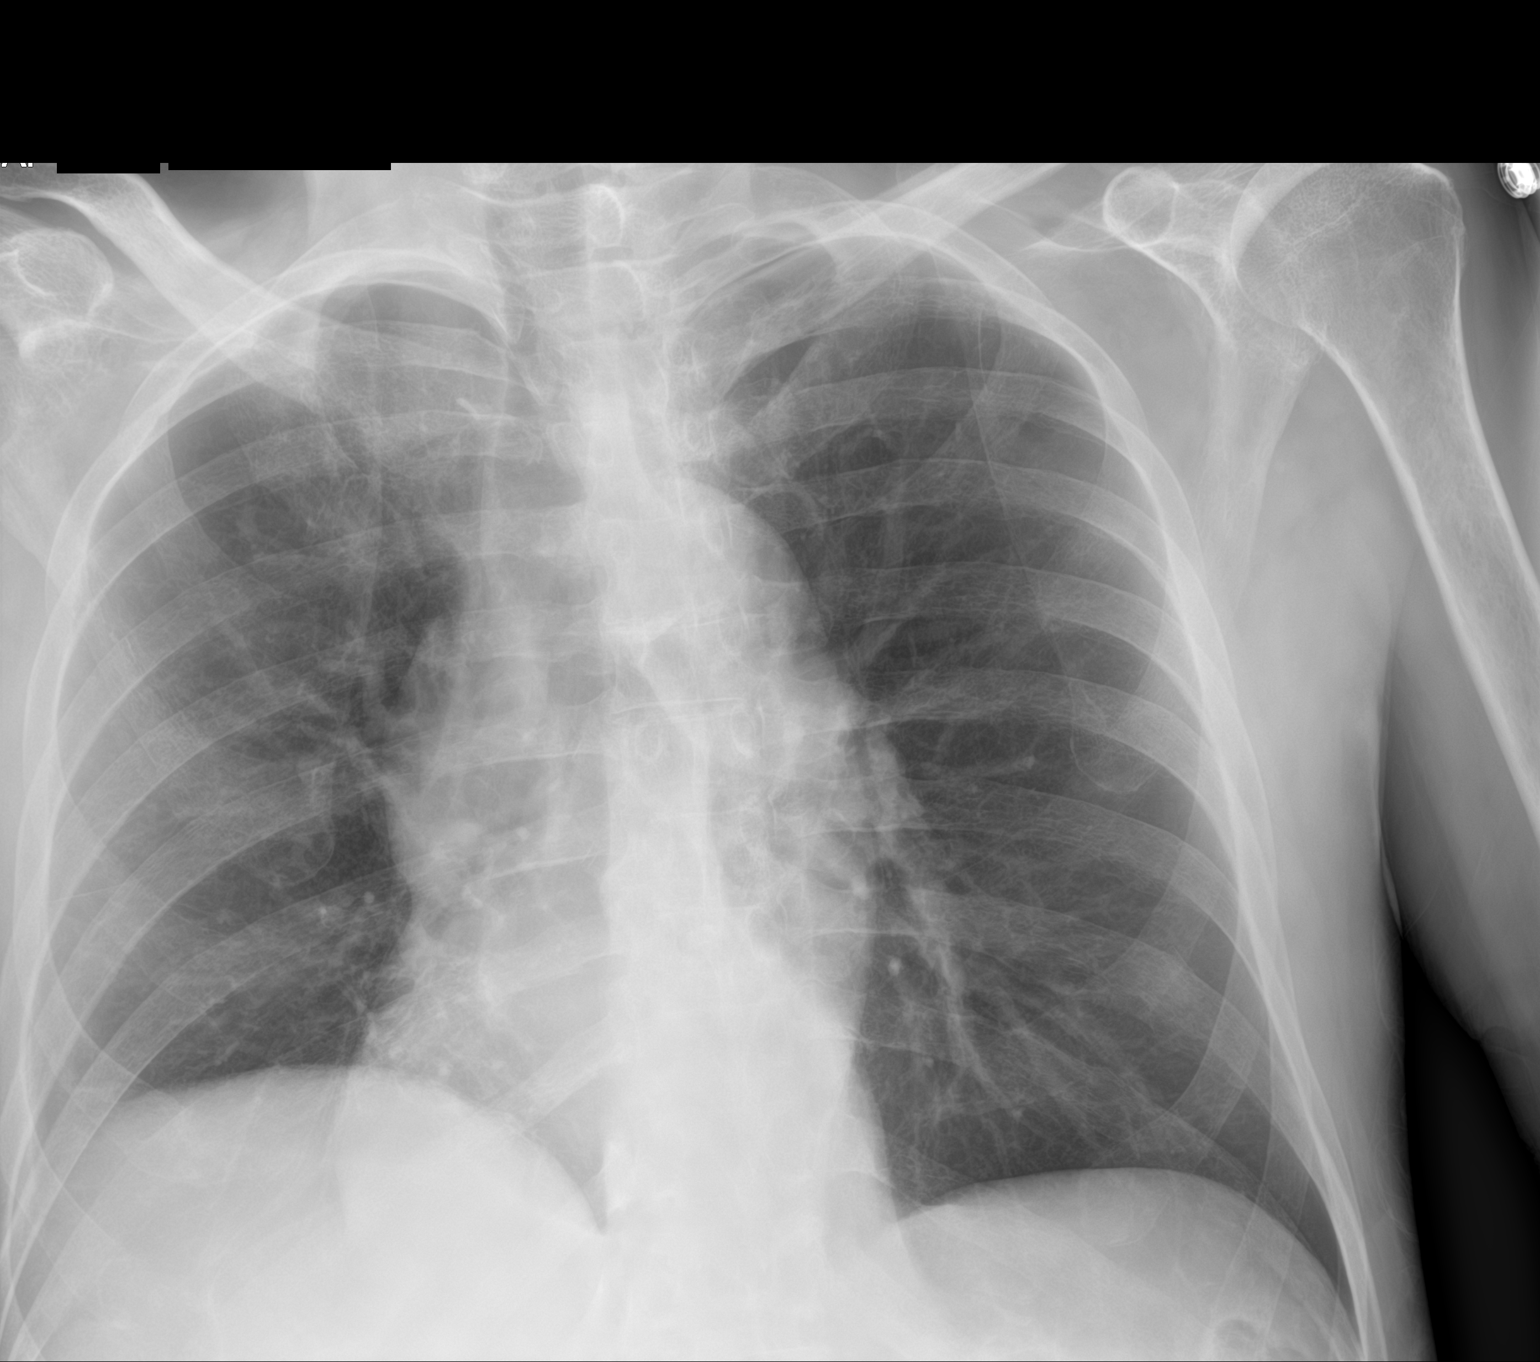

[2 of 2 positions shown; findings below may reference images not displayed]

FINDINGS: Mild elevation of the right hemidiaphragm. No consolidation or
edema. No significant pleural effusion. Stable blunting of the right
costophrenic angle. Stable cardiomediastinal contours.
IMPRESSION: No acute process in the chest.

## 2021-01-06 IMAGING — CT CT HEAD W/O CM
3 series · 14 of 47 positions shown, 16 images · non-contrast
Comparison: [DATE]

CLINICAL DATA: Followup subdural hematoma

EXAM:
CT HEAD WITHOUT CONTRAST
TECHNIQUE: Contiguous axial images were obtained from the base of the skull
through the vertex without intravenous contrast.

[Series 3: head 5.0 h30s · axial · 0.48mm/px · z∈[-135,-5]mm · 8 of 32 slices shown, 10 images]
[im 3/32  brain]
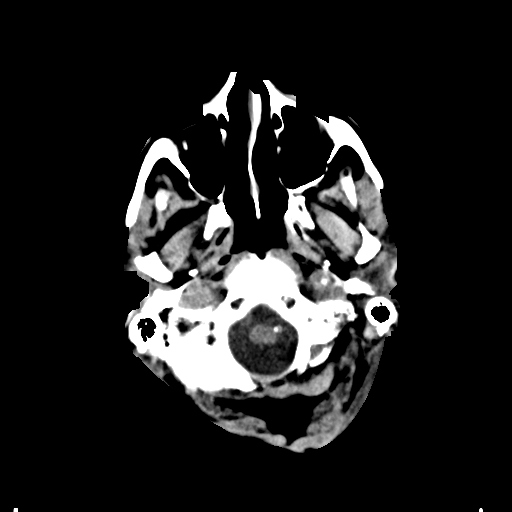
[im 3/32  bone]
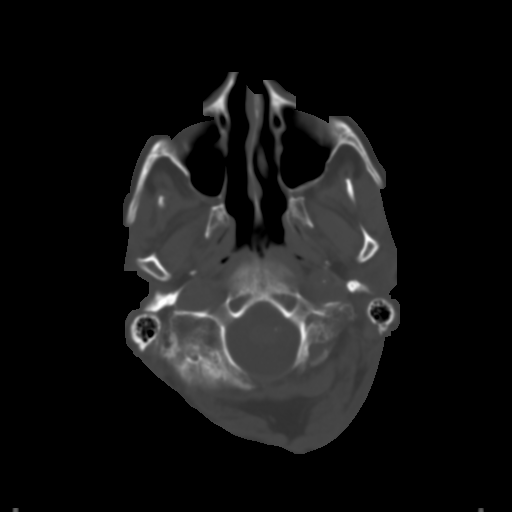
[im 7/32  brain]
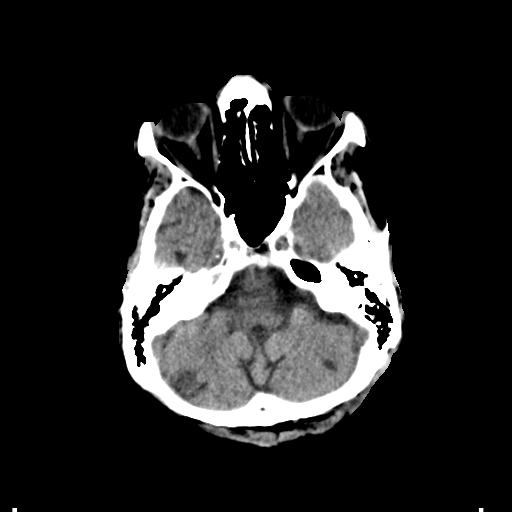
[im 10/32  brain]
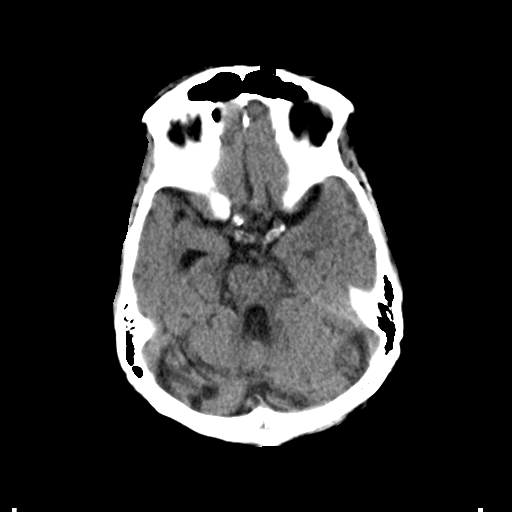
[im 14/32  brain]
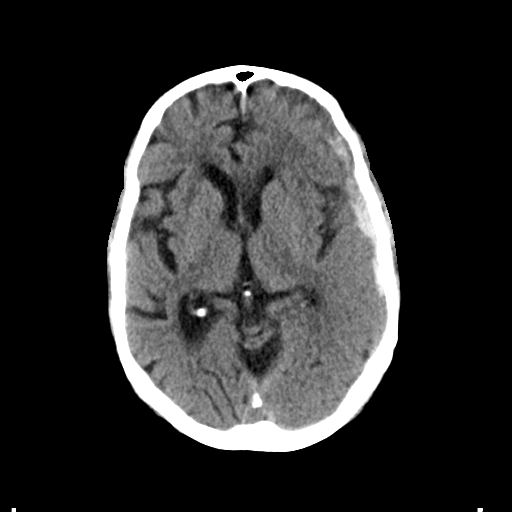
[im 18/32  brain]
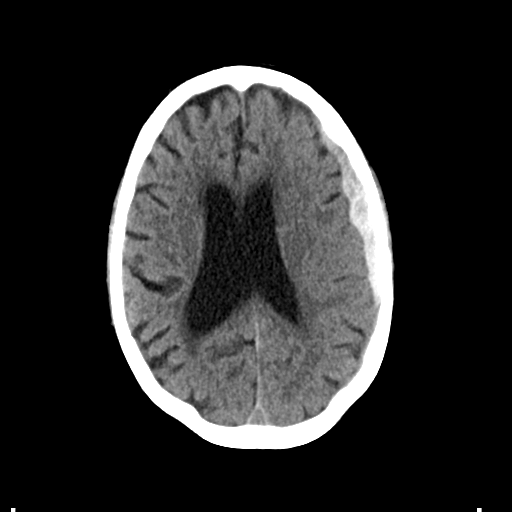
[im 18/32  bone]
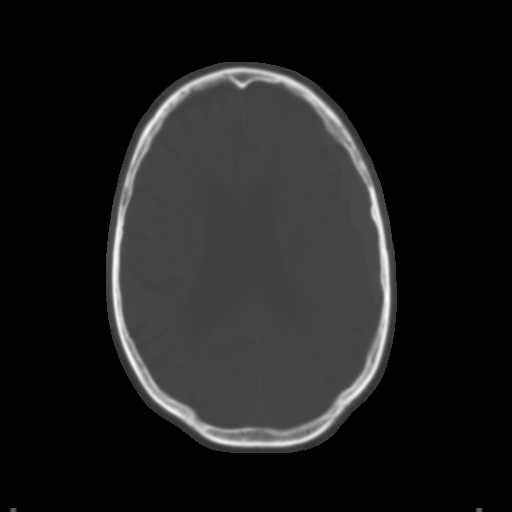
[im 22/32  brain]
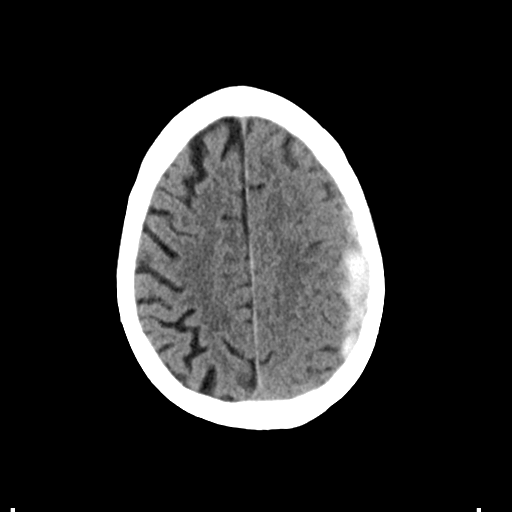
[im 25/32  brain]
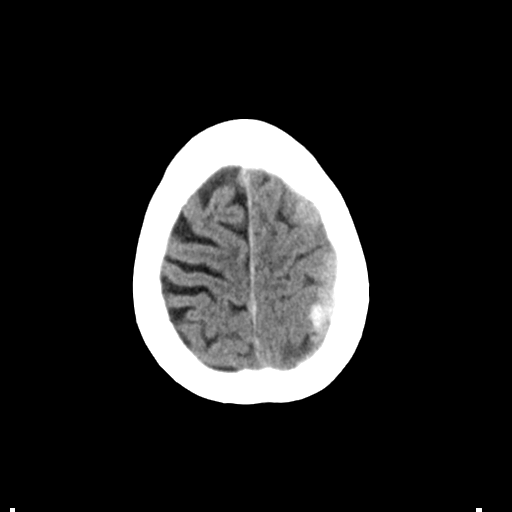
[im 29/32  brain]
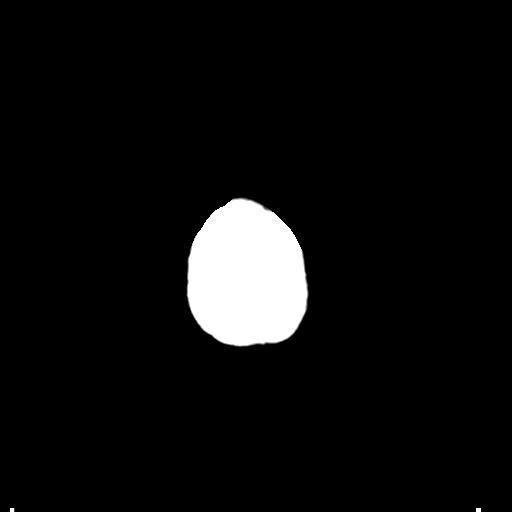

[Series 5: head 3.0 mpr cor · coronal · 0.31mm/px · 3 of 71 slices shown]
[im 24/71  brain]
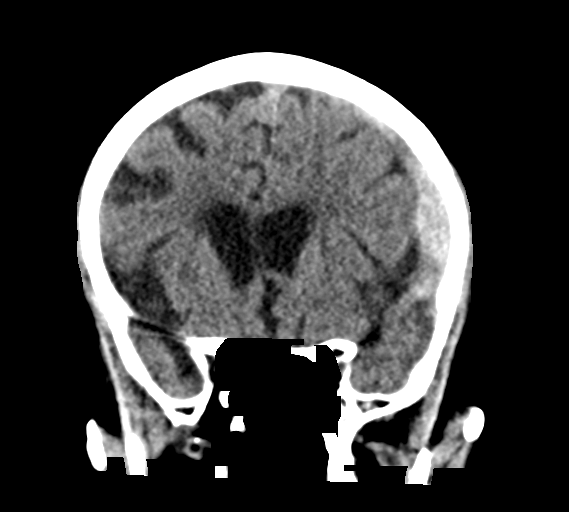
[im 32/71  brain]
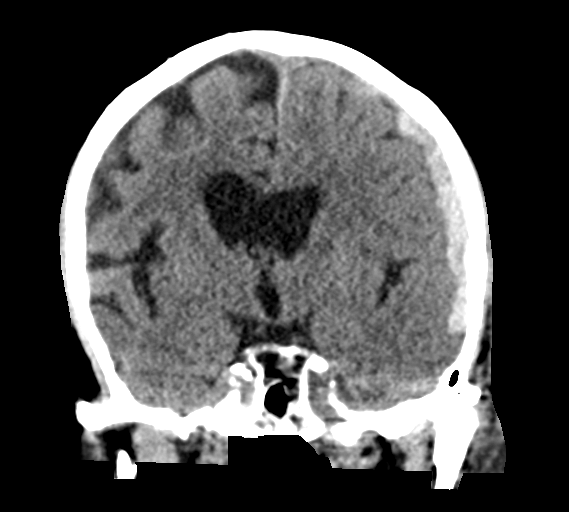
[im 39/71  brain]
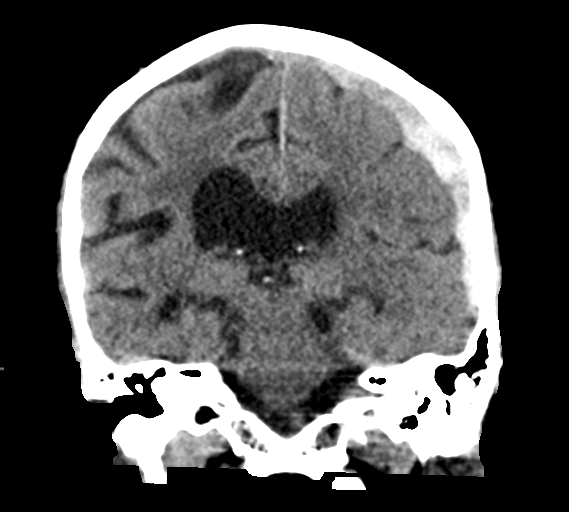

[Series 6: head 3.0 mpr sag · sagittal · 0.31mm/px · 3 of 62 slices shown]
[im 21/62  brain]
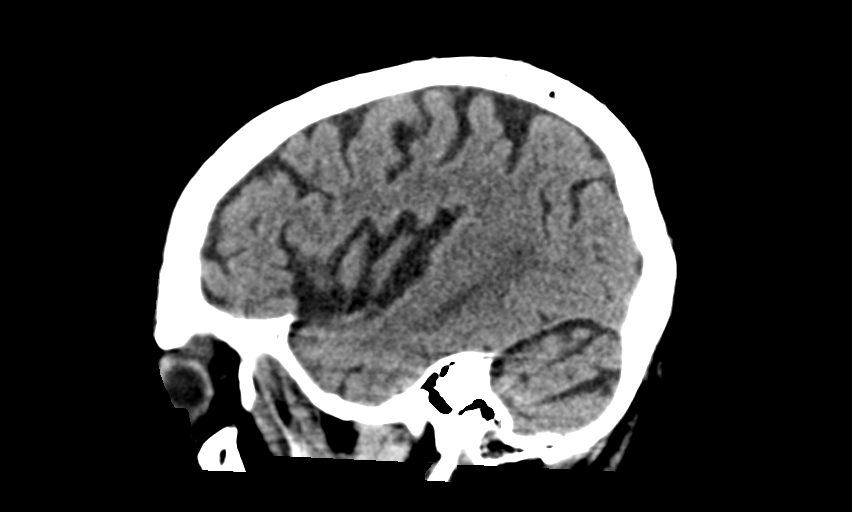
[im 31/62  brain]
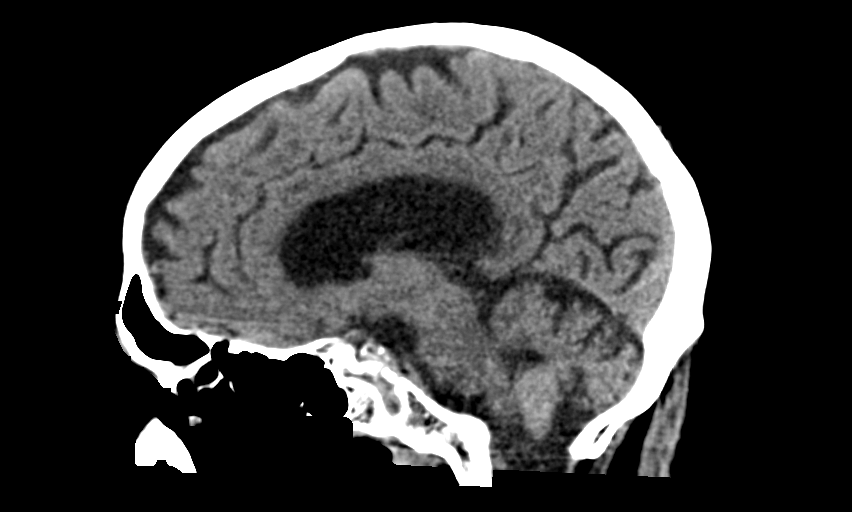
[im 41/62  brain]
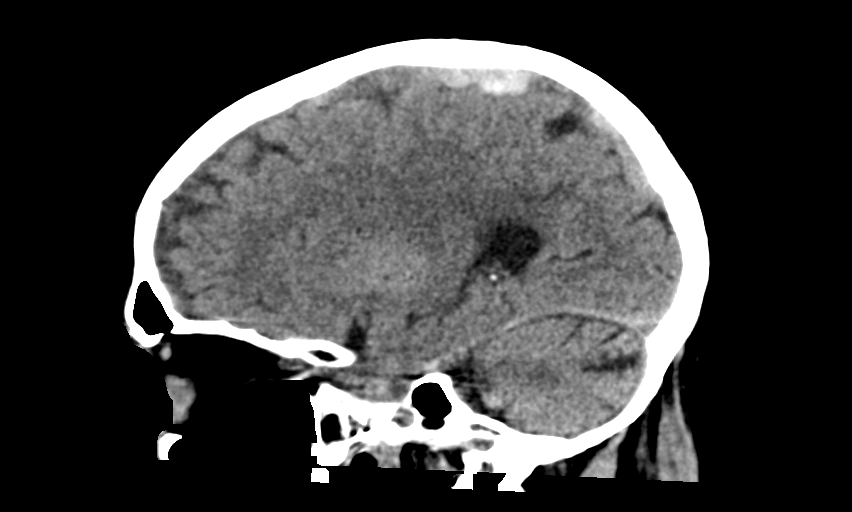

[14 of 47 positions shown; findings below may reference images not displayed]

FINDINGS: Brain: Left lateral convexity subdural hematoma is stable, maximal
thickness 10 mm measured on the coronal imaging. No additional
bleeding or mass-effect. Left-to-right shift of 4 mm. Tiny amount of
subdural blood along the falx is unchanged. No new area of bleeding.
No acute ischemic infarction. Old small vessel infarctions of the
cerebellum and chronic small-vessel ischemic changes of the cerebral
hemispheric white matter.

Vascular: No acute vascular finding.

Skull: Negative

Sinuses/Orbits: Clear/normal

Other: None
IMPRESSION: No change since yesterday. Left convexity subdural is no larger.
Mass-effect is the same. Small amount of blood along the falx is the
same.

## 2021-01-06 MED ORDER — LORAZEPAM 2 MG/ML IJ SOLN: 2.0000 mg | Freq: Once | INTRAMUSCULAR | Status: DC

## 2021-01-06 MED ORDER — AMIODARONE HCL IN DEXTROSE 360-4.14 MG/200ML-% IV SOLN
30.0000 mg/h | INTRAVENOUS | Status: DC
  Administered 2021-01-06 – 2021-01-07 (×3): 30 mg/h via INTRAVENOUS
  Filled 2021-01-06 (×4): qty 200

## 2021-01-06 MED ORDER — METOPROLOL TARTRATE 5 MG/5ML IV SOLN
2.5000 mg | Freq: Three times a day (TID) | INTRAVENOUS | Status: DC
  Administered 2021-01-06 – 2021-01-07 (×2): 2.5 mg via INTRAVENOUS
  Filled 2021-01-06 (×2): qty 5

## 2021-01-06 MED ORDER — MAGNESIUM SULFATE 2 GM/50ML IV SOLN
2.0000 g | Freq: Once | INTRAVENOUS | Status: AC
  Administered 2021-01-06: 2 g via INTRAVENOUS
  Filled 2021-01-06: qty 50

## 2021-01-06 MED ORDER — ACETAMINOPHEN 650 MG RE SUPP
650.0000 mg | RECTAL | Status: DC | PRN
  Administered 2021-01-06: 650 mg via RECTAL
  Filled 2021-01-06: qty 1

## 2021-01-06 MED ORDER — LORAZEPAM 2 MG/ML IJ SOLN
INTRAMUSCULAR | Status: AC
  Filled 2021-01-06: qty 1

## 2021-01-06 MED ORDER — POTASSIUM CHLORIDE 2 MEQ/ML IV SOLN
INTRAVENOUS | Status: DC
  Filled 2021-01-06 (×2): qty 1000

## 2021-01-06 MED ORDER — AMIODARONE LOAD VIA INFUSION
150.0000 mg | Freq: Once | INTRAVENOUS | Status: AC
  Administered 2021-01-06: 150 mg via INTRAVENOUS
  Filled 2021-01-06: qty 83.34

## 2021-01-06 MED ORDER — LEVETIRACETAM IN NACL 1000 MG/100ML IV SOLN
1000.0000 mg | Freq: Once | INTRAVENOUS | Status: AC
  Administered 2021-01-06: 1000 mg via INTRAVENOUS
  Filled 2021-01-06: qty 100

## 2021-01-06 MED ORDER — LEVETIRACETAM IN NACL 500 MG/100ML IV SOLN
500.0000 mg | Freq: Two times a day (BID) | INTRAVENOUS | Status: DC
  Administered 2021-01-06 – 2021-01-07 (×2): 500 mg via INTRAVENOUS
  Filled 2021-01-06 (×2): qty 100

## 2021-01-06 MED ORDER — AMIODARONE HCL IN DEXTROSE 360-4.14 MG/200ML-% IV SOLN
60.0000 mg/h | INTRAVENOUS | Status: AC
  Administered 2021-01-06 (×2): 60 mg/h via INTRAVENOUS
  Filled 2021-01-06: qty 200

## 2021-01-06 MED ORDER — SODIUM CHLORIDE 0.9% FLUSH: 10.0000 mL | INTRAVENOUS | Status: DC | PRN

## 2021-01-06 MED ORDER — SODIUM CHLORIDE 0.9% IV SOLUTION: Freq: Once | INTRAVENOUS | Status: DC

## 2021-01-06 MED ORDER — CHLORHEXIDINE GLUCONATE CLOTH 2 % EX PADS
6.0000 | MEDICATED_PAD | Freq: Every day | CUTANEOUS | Status: DC
  Administered 2021-01-06 – 2021-01-20 (×15): 6 via TOPICAL

## 2021-01-06 MED ORDER — LORAZEPAM 2 MG/ML IJ SOLN
1.0000 mg | INTRAMUSCULAR | Status: DC | PRN
  Administered 2021-01-06: 2 mg via INTRAVENOUS

## 2021-01-06 MED ORDER — SODIUM CHLORIDE 0.9% FLUSH
10.0000 mL | Freq: Two times a day (BID) | INTRAVENOUS | Status: DC
  Administered 2021-01-07 – 2021-01-22 (×23): 10 mL

## 2021-01-06 MED ORDER — LORAZEPAM 2 MG/ML IJ SOLN: 2.0000 mg | INTRAMUSCULAR | Status: DC | PRN

## 2021-01-06 MED ORDER — LORAZEPAM 1 MG PO TABS
1.0000 mg | ORAL_TABLET | ORAL | Status: DC | PRN
Start: 2021-01-06 — End: 2021-01-08
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
## 2021-01-06 NOTE — Progress Notes (Addendum)
PROGRESS NOTE                                                                                                                                                                                                             Patient Demographics:    Seth [name], is a 65 y.o. male, DOB - 1956/01/25, WC:[id number]  Outpatient Primary MD for the patient is Lucianne Lei, MD    LOS - 4  Admit date - 01/01/2021    Chief Complaint  Patient presents with  . Altered Mental Status       Brief Narrative (HPI from H&P)  - Seth [name] is a 65 y.o. male with medical history significant for history of CVA, PVD s/p LLE angiography with DES placed to popliteal artery, left foot osteomyelitis s/p transmetatarsal hypertension 09/22/2020, COPD, hypertension, alcohol use, and tobacco use who presents to the ED for evaluation of altered mental status, in the ER his work-up was consistent with dehydration, hypoglycemia and possible SIRS.   Subjective:   Patient in bed, appears comfortable, denies any headache, no fever, no chest pain or pressure, no shortness of breath , no abdominal pain. No new focal weakness.   Assessment  & Plan :     1. Acute mental status decline - likely on by alcohol abuse, poor oral intake causing hypoglycemia, hypomagnesemia, dehydration, AKI and hyperkalemia, with possible DTs. CT showing SDH.  He has been extensively counseled to quit alcohol intake, hydrate with IV fluids, replace magnesium, hyperkalemia has resolved, monitor CBGs closely, encourage oral intake, continue PT OT and monitor.  For his SDH CT scan x 2 done, case discussed with Dr. Saintclair Halsted neurosurgery, for now no surgical intervention, hold off Plavix and monitor clinically.  Currently he is symptom-free from the standpoint, CT scan x4 stable, case discussed with Dr. Saintclair Halsted will follow in the office in 7 to 10 days and repeat another scan at that time.  2.  SIRS.   Likely due to dehydration no source of infection, hydrate with IV fluids and monitor.  3.  History of CVA.  Supportive care continue ASA with statin.  4.  Macrocytic anemia.  Borderline B12 levels, B12 replaced, given a unit of packed RBC on 01/06/2021.  5.  History of PAD s/p left lower extremity angiography with DES placement to popliteal artery, s/p left  transmetatarsal amputation in the past.  Supportive care. Was on DAPT with statin.  Case discussed with his vascular surgeon Dr. Donzetta Matters on 04/2021, continue aspirin and statin for now.  Can hold Plavix indefinitely if needed for SDH.  6.  Alcohol abuse.  Counseled to quit last drink on 12/27/2020 ?,  He is slightly more confused on 01/03/2021 and could be going into early DTs, will monitor on CIWA protocol.  7.  COPD.  Stable on supportive care.  8.  Ongoing history of smoking.  Counseled to quit on Nico Derm patch.  9. Hypokalemia - replaced.  10. Asp. PNA - low clinical fever with some fatigue and intermittent confusion, although chest x-ray is stable, clinically risk for aspiration, UA and bladder scan unremarkable, speech following, dysphagia 2 diet empirically with Rocephin, follow blood cultures.  Supportive care with Tylenol for fevers.   Addendum .  At 1:30 PM patient had an episode of seizure-like activity, received 2 mg of IV Ativan, ordered EEG and Keppra along with repeat CT scan of the brain.  Discussed with neurosurgeon Dr. Saintclair Halsted who will have his partner follow the patient closely along with the CT scan, neurology also consulted.    While coming back from CT scan patient had an episode of V. tach and briefly became pulseless, CODE BLUE was called but patient recovered himself within a few seconds.  Will get EEG, give him 2 g of IV mag and load him with amiodarone.  Noted recent echocardiogram with EF of 60%.  Probably prudent to transfer him to ICU for the next day or so till he is stable.  Sister reupdated again.       Condition  - Extremely Guarded  Family Communication  : Sister Cecilio Asper [name] 01/03/21, 01/06/21  Code Status :  Full  Consults  :  Nuerosurgery Dr Saintclair Halsted, PCCM  PUD Prophylaxis : PPI   Procedures  :     CT Head - Non Acute x 4      Disposition Plan  :    Status is:   Dispo: The patient is from: Home              Anticipated d/c is to: Home              Patient currently is not medically stable to d/c.   Difficult to place patient No   DVT Prophylaxis  :    Place and maintain sequential compression device Start: 01/03/21 1148 Place TED hose Start: 01/03/21 0658    Lab Results  Component Value Date   PLT 143 (L) 01/06/2021    Diet :  Diet Order            DIET DYS 2 Room service appropriate? Yes; Fluid consistency: Thin  Diet effective now                  Inpatient Medications  Scheduled Meds: . sodium chloride   Intravenous Once  . amLODipine  10 mg Oral Daily  . aspirin  81 mg Oral Daily  . feeding supplement  1 Container Oral TID BM  . ferrous sulfate  325 mg Oral BID WC  . finasteride  5 mg Oral Daily  . fluticasone furoate-vilanterol  1 puff Inhalation Daily  . folic acid  1 mg Oral Daily  . metoprolol tartrate  50 mg Oral BID  . multivitamin with minerals  1 tablet Oral Daily  . nicotine  21 mg Transdermal Daily  .  rosuvastatin  10 mg Oral Daily  . sodium chloride flush  10-40 mL Intracatheter Q12H  . thiamine  100 mg Oral Daily  . vitamin B-12  1,000 mcg Oral Daily   Continuous Infusions: . ampicillin-sulbactam (UNASYN) IV 3 g (01/06/21 1139)   PRN Meds:.acetaminophen **OR** [DISCONTINUED] acetaminophen, albuterol, cloNIDine, dextrose, guaiFENesin, [DISCONTINUED] ondansetron **OR** ondansetron (ZOFRAN) IV, sodium chloride flush  Antibiotics  :    Anti-infectives (From admission, onward)   Start     Dose/Rate Route Frequency Ordered Stop   01/05/21 1800  Ampicillin-Sulbactam (UNASYN) 3 g in sodium chloride 0.9 % 100 mL IVPB        3  g 200 mL/hr over 30 Minutes Intravenous Every 6 hours 01/05/21 1657     01/01/21 2330  vancomycin (VANCOREADY) IVPB 1250 mg/250 mL        1,250 mg 166.7 mL/hr over 90 Minutes Intravenous  Once 01/01/21 2302 01/02/21 0301   01/01/21 2315  piperacillin-tazobactam (ZOSYN) IVPB 3.375 g        3.375 g 12.5 mL/hr over 240 Minutes Intravenous  Once 01/01/21 2302 01/02/21 0404       Time Spent in minutes  30   [name] M.D on 01/06/2021 at 11:49 AM  To page go to [email or web address]   [facility] -  Office  [phone]    See all Orders from today for further details    Objective:   Vitals:   01/06/21 0834 01/06/21 0915 01/06/21 1030 01/06/21 1113  BP:  130/87 133/75   Pulse: 90 90 79   Resp: 16 15 16    Temp:  99.6 F (37.6 C) 99.4 F (37.4 C)   TempSrc:  Oral Oral   SpO2:    100%  Weight:      Height:        Wt Readings from Last 3 Encounters:  01/02/21 62.4 kg  10/04/20 67.1 kg  09/27/20 67.3 kg     Intake/Output Summary (Last 24 hours) at 01/06/2021 1149 Last data filed at 01/06/2021 1030 Gross per 24 hour  Intake 1095.6 ml  Output 1750 ml  Net -654.4 ml     Physical Exam  Awake Alert, No new F.N deficits, Normal affect [place].AT,PERRAL Supple Neck,No JVD, No cervical lymphadenopathy appriciated.  Symmetrical Chest wall movement, Good air movement bilaterally, CTAB RRR,No Gallops, Rubs or new Murmurs, No Parasternal Heave +ve B.Sounds, Abd Soft, No tenderness, No organomegaly appriciated, No rebound - guarding or rigidity. 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    Data Review:    CBC Recent Labs  Lab 01/01/21 2124 01/02/21 0300 01/03/21 0405 01/04/21 0314 01/05/21 0010 01/06/21 0142  WBC 6.1 5.5 6.6 6.0 6.4 6.4  HGB 8.6* 7.2* 8.1* 7.7* 7.6* 6.9*  HCT 27.4* 21.4* 23.7* 23.0* 23.2* 20.3*  PLT 127* 97* 93* 91* 119* 143*  MCV 106.6* 100.5* 99.6 101.3* 103.1* 100.0  MCH 33.5 33.8 34.0 33.9 33.8 34.0  MCHC 31.4 33.6 34.2 33.5 32.8  34.0  RDW 17.9* 17.1* 17.0* 17.2* 17.3* 16.9*  LYMPHSABS 0.6*  --  1.2 1.3 1.5 1.5  MONOABS 0.6  --  0.8 0.9 1.5* 1.6*  EOSABS 0.0  --  0.0 0.0 0.1 0.1  BASOSABS 0.0  --  0.0 0.0 0.0 0.0    Recent Labs  Lab 01/01/21 2124 01/01/21 2134 01/01/21 2334 01/02/21 0300 01/02/21 1226 01/03/21 0405 01/04/21 0314 01/04/21 1401 01/04/21 2205 01/05/21 0010 01/05/21 0758 01/06/21 0142  NA 135  --   --  136  --  135 134*  --   --  134*  --  131*  K 5.6*  --   --  4.1  --  2.9* 2.7* 3.7  --  3.6  --  3.7  CL 107  --   --  100  --  98 100  --   --  103  --  100  CO2 10*  --   --  18*  --  27 27  --   --  26  --  23  GLUCOSE 64*  --   --  82  --  120* 102*  --   --  97  --  93  BUN 14  --   --  13  --  6* <5*  --   --  7*  --  7*  CREATININE 1.51*  --   --  1.55*  --  0.81 0.69  --   --  0.78  --  0.79  CALCIUM 6.6*  --   --  8.3*  --  8.8* 8.3*  --   --  8.7*  --  8.6*  AST 46*  --   --  40  --  44* 33  --   --  25  --  19  ALT 14  --   --  16  --  17 15  --   --  12  --  12  ALKPHOS 65  --   --  66  --  68 72  --   --  70  --  60  BILITOT 3.2*  --   --  2.6*  --  1.0 1.1  --   --  0.8  --  0.7  ALBUMIN 2.7*  --   --  2.8*  --  3.0* 2.9*  --   --  2.8*  --  2.6*  MG  --   --   --  1.4*  --  1.6* 2.2  --   --  2.0  --  1.7  PROCALCITON  --   --   --   --    < > 0.21 <0.10  --  <0.10  --  <0.10 <0.10  LATICACIDVEN  --  2.8* 2.6*  --   --   --   --   --  3.0* 1.2  --   --   INR 0.9  --   --   --   --   --   --   --   --   --   --   --   BNP  --   --   --   --   --  244.9* 204.5*  --   --  162.8*  --  308.1*   < > = values in this interval not displayed.    ------------------------------------------------------------------------------------------------------------------ No results for input(s): CHOL, HDL, LDLCALC, TRIG, CHOLHDL, LDLDIRECT in the last 72 hours.  Lab Results  Component Value Date   HGBA1C 4.2 (L) 07/20/2020    ------------------------------------------------------------------------------------------------------------------ No results for input(s): TSH, T4TOTAL, T3FREE, THYROIDAB in the last 72 hours.  Invalid input(s): FREET3  Cardiac Enzymes No results for input(s): CKMB, TROPONINI, MYOGLOBIN in the last 168 hours.  Invalid input(s): CK ------------------------------------------------------------------------------------------------------------------    Component Value Date/Time   BNP 308.1 (H) 01/06/2021 0142    Micro Results Recent Results (from the past 240 hour(s))  Culture, blood (Routine x 2)     Status: None   Collection  Time: 01/01/21 10:04 PM   Specimen: BLOOD RIGHT HAND  Result Value Ref Range Status   Specimen Description BLOOD RIGHT HAND  Final   Special Requests   Final    BOTTLES DRAWN AEROBIC ONLY Blood Culture results may not be optimal due to an inadequate volume of blood received in culture bottles   Culture   Final    NO GROWTH 5 DAYS Performed at [facility], [place] [address], [place], [place] [address]    Report Status 01/06/2021 FINAL  Final  Resp Panel by RT-PCR (Flu A&B, Covid) Nasopharyngeal Swab     Status: None   Collection Time: 01/01/21 10:09 PM   Specimen: Nasopharyngeal Swab; Nasopharyngeal(NP) swabs in vial transport medium  Result Value Ref Range Status   SARS Coronavirus 2 by RT PCR NEGATIVE NEGATIVE Final    Comment: (NOTE) SARS-CoV-2 target nucleic acids are NOT DETECTED.  The SARS-CoV-2 RNA is generally detectable in upper respiratory specimens during the acute phase of infection. The lowest concentration of SARS-CoV-2 viral copies this assay can detect is 138 copies/mL. A negative result does not preclude SARS-Cov-2 infection and should not be used as the sole basis for treatment or other patient management decisions. A negative result may occur with  improper specimen collection/handling, submission of specimen other than  nasopharyngeal swab, presence of viral mutation(s) within the areas targeted by this assay, and inadequate number of viral copies(<138 copies/mL). A negative result must be combined with clinical observations, patient history, and epidemiological information. The expected result is Negative.  Fact Sheet for Patients:  EntrepreneurPulse.com.au  Fact Sheet for Healthcare Providers:  IncredibleEmployment.be  This test is no t yet approved or cleared by the [place] FDA and  has been authorized for detection and/or diagnosis of SARS-CoV-2 by FDA under an Emergency Use Authorization (EUA). This EUA will remain  in effect (meaning this test can be used) for the duration of the COVID-19 declaration under Section 564(b)(1) of the Act, 21 U.S.C.section 360bbb-3(b)(1), unless the authorization is terminated  or revoked sooner.       Influenza A by PCR NEGATIVE NEGATIVE Final   Influenza B by PCR NEGATIVE NEGATIVE Final    Comment: (NOTE) The Xpert Xpress SARS-CoV-2/FLU/RSV plus assay is intended as an aid in the diagnosis of influenza from Nasopharyngeal swab specimens and should not be used as a sole basis for treatment. Nasal washings and aspirates are unacceptable for Xpert Xpress SARS-CoV-2/FLU/RSV testing.  Fact Sheet for Patients: EntrepreneurPulse.com.au  Fact Sheet for Healthcare Providers: IncredibleEmployment.be  This test is not yet approved or cleared by the [place] FDA and has been authorized for detection and/or diagnosis of SARS-CoV-2 by FDA under an Emergency Use Authorization (EUA). This EUA will remain in effect (meaning this test can be used) for the duration of the COVID-19 declaration under Section 564(b)(1) of the Act, 21 U.S.C. section 360bbb-3(b)(1), unless the authorization is terminated or revoked.  Performed at [facility], [place] [address] [place], [place] [address]    Culture, blood (Routine x 2)     Status: None (Preliminary result)   Collection Time: 01/01/21 11:34 PM   Specimen: BLOOD LEFT ARM  Result Value Ref Range Status   Specimen Description BLOOD LEFT ARM  Final   Special Requests   Final    BOTTLES DRAWN AEROBIC AND ANAEROBIC Blood Culture adequate volume   Culture   Final    NO GROWTH 4 DAYS Performed at [facility], [place]  [address], [place], [place] [address]    Report Status PENDING  Incomplete  Culture, blood (routine x 2)     Status: None (Preliminary result)   Collection Time: 01/04/21 10:00 PM   Specimen: BLOOD RIGHT HAND  Result Value Ref Range Status   Specimen Description BLOOD RIGHT HAND  Final   Special Requests   Final    BOTTLES DRAWN AEROBIC AND ANAEROBIC Blood Culture adequate volume   Culture   Final    NO GROWTH 2 DAYS Performed at [facility], [place] [address] [place], [place] [address]    Report Status PENDING  Incomplete  Culture, blood (routine x 2)     Status: None (Preliminary result)   Collection Time: 01/04/21 10:09 PM   Specimen: BLOOD RIGHT HAND  Result Value Ref Range Status   Specimen Description BLOOD RIGHT HAND  Final   Special Requests   Final    AEROBIC BOTTLE ONLY Blood Culture results may not be optimal due to an inadequate volume of blood received in culture bottles   Culture   Final    NO GROWTH 2 DAYS Performed at [facility], [place] [address], [place], [place] [address]    Report Status PENDING  Incomplete    Radiology Reports  DG Chest 2 View  Result Date: 01/01/2021 CLINICAL DATA:  Altered mental status and possible sepsis EXAM: CHEST - 2 VIEW COMPARISON:  09/19/2020 FINDINGS: The heart size and mediastinal contours are within normal limits. Both lungs are clear. The visualized skeletal structures are unremarkable. IMPRESSION: No active cardiopulmonary disease. Electronically Signed   By: Inez Catalina M.D.   On: 01/01/2021 21:44   CT HEAD WO CONTRAST  Result Date:  01/05/2021 CLINICAL DATA:  Follow-up subdural hematoma. EXAM: CT HEAD WITHOUT CONTRAST TECHNIQUE: Contiguous axial images were obtained from the base of the skull through the vertex without intravenous contrast. COMPARISON:  Earlier today, additional priors. FINDINGS: Brain: Acute left subdural hematoma is essentially unchanged in size from earlier today, maximal dimension 12 mm in the parietal region, series 3, image 11, 11 mm earlier today, and 14 mm yesterday. There is trace subdural blood along the superior falx, unchanged from earlier today. Minimal mass effect on the underlying high convexity. Left-to-right midline shift of 5 mm, previously 6 mm. No intraparenchymal or subarachnoid hemorrhage. No evidence of developing ischemia. Remote bilateral cerebellar infarcts. Vascular: Atherosclerosis of skullbase vasculature without hyperdense vessel or abnormal calcification. Skull: No fracture or focal lesion. Sinuses/Orbits: No acute findings. Other: None. IMPRESSION: 1. Acute left subdural hematoma is essentially unchanged in size from earlier today, maximal dimension 12 mm in the parietal region. Left-to-right midline shift of 5 mm, previously 6 mm. 2. Small amount of subdural blood tracks along the superior falx, unchanged. Electronically Signed   By: Keith Rake M.D.   On: 01/05/2021 18:30   CT HEAD WO CONTRAST  Result Date: 01/05/2021 CLINICAL DATA:  Recent subdural hematoma EXAM: CT HEAD WITHOUT CONTRAST TECHNIQUE: Contiguous axial images were obtained from the base of the skull through the vertex without intravenous contrast. COMPARISON:  Jan 04, 2021 FINDINGS: Brain: The previously noted acute subdural hematoma along the left frontal and temporal lobes extending superiorly to the level of the left convexity is again noted with a maximum thickness of 1.1 cm in the left convexity region, marginally less thick than on the previous study. Subdural size and contour elsewhere appears essentially stable.  There is stable mass effect on portions of the left  frontal and temporal lobes with 6 mm of midline shift to the right, stable. No new or enlarging subdural hematoma evident. The slight amount of subdural hematoma in the anterior falx is stable without mass effect. There is no evident intra-axial mass or hemorrhage. Mild diffuse atrophy with mild underlying periventricular small vessel disease noted. Prior cerebellar infarcts are stable, larger on the right than on the left. Note that fourth ventricle is midline. No acute infarct evident. Vascular: No evident hyperdense vessel. Calcification noted in each carotid siphon region. Skull: Bony calvarium appears intact. Sinuses/Orbits: Stable opacification in several ethmoid air cells and superior right maxillary antrum, stable. Orbits appear symmetric bilaterally. Other: Mastoid air cells clear IMPRESSION: Left-sided subdural hematoma again noted, minimally smaller in the region of the convexity and stable elsewhere. Degree of mass effect stable with 6 mm of midline shift toward the right at the level of the foramen of Monro. Fourth ventricle midline. Stable atrophy with periventricular small vessel disease. Prior cerebellar infarcts appear stable. No acute infarct. No intra-axial mass or hemorrhage. There are foci of arterial vascular calcification. There are foci of paranasal sinus disease. Electronically Signed   By: Lowella Grip III M.D.   On: 01/05/2021 12:29   CT HEAD WO CONTRAST  Result Date: 01/04/2021 CLINICAL DATA:  Subdural hematoma EXAM: CT HEAD WITHOUT CONTRAST TECHNIQUE: Contiguous axial images were obtained from the base of the skull through the vertex without intravenous contrast. COMPARISON:  Jan 03, 2021 FINDINGS: Brain: There is an acute appearing subdural hematoma again noted along the left frontal and temporal lobes extending superiorly to the level of the left convexity. The maximum thickness of this subdural hematoma in the left convexity  region is 1.4 cm, stable by remeasurement. There is stable mass effect on portions of the left frontal, temporal, anterior parietal lobes. At the level of the foramen of Monro, there is 6 mm of midline shift toward the right, slightly increased compared to 1 day prior. There is subdural hematoma tracking along the falx with slightly more hemorrhage in this area compared to previous study. There is no appreciable mass effect along the falx, however. There is no intra-axial hemorrhage or mass. There is underlying mild diffuse atrophy with mild periventricular small vessel disease. There are small prior infarcts in the cerebellum bilaterally. No acute infarct is appreciable. Vascular: No hyperdense vessel. There is calcification in each carotid siphon region. Skull: The bony calvarium appears intact. Sinuses/Orbits: There is opacification in several ethmoid air cells as well as in the anterior superior right maxillary antrum. Visualized orbits appear symmetric bilaterally. Other: Mastoid air cells are clear. IMPRESSION: Subdural hematoma on the left with essentially stable size and contour compared to the previous study. There remains mass effect on portions of the left frontal, left temporal, anterior left parietal lobes. There is marginally more midline shift to the right compared to 1 day prior. There is slightly more acute subdural hematoma in the falx without appreciable mass effect in the falcine region. There is no appreciable intra-axial hemorrhage. There is mild atrophy with mild periventricular small vessel disease. Small chronic appearing infarcts in the cerebellum, stable. No evident acute infarct. Foci of arterial vascular calcification noted. Areas of paranasal sinus disease noted. Electronically Signed   By: Lowella Grip III M.D.   On: 01/04/2021 08:01   CT HEAD WO CONTRAST  Result Date: 01/03/2021 CLINICAL DATA:  Delirium EXAM: CT HEAD WITHOUT CONTRAST TECHNIQUE: Contiguous axial images were  obtained from the base of the skull  through the vertex without intravenous contrast. COMPARISON:  09/14/2020 FINDINGS: Brain: Hyperdense subdural hemorrhage is present along the left cerebral convexity measuring up to 9 mm thickness. Small volume hyperdense subdural hemorrhage is also present along the falx. Mass effect is mild with 4 mm of rightward midline shift at the foramen of Monro. 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 Gray-white differentiation is preserved. Prominence of the ventricles and sulci reflects generalized parenchymal volume loss. Chronic bilateral cerebellar infarcts. Patchy hypoattenuation in the supratentorial white matter is nonspecific but probably reflects stable chronic microvascular ischemic changes. Vascular: There is intracranial atherosclerotic calcification at the skull base Skull: Unremarkable. Sinuses/Orbits: Patchy paranasal sinus mucosal thickening. No acute orbital abnormality. Other: Mastoid air cells are clear. IMPRESSION: Acute left cerebral convexity subdural hemorrhage. Minimal additional subdural hemorrhage along the falx. Mass effect is mild with minor rightward midline shift. Stable chronic findings detailed above. These results were called by telephone at the time of interpretation on 01/03/2021 at 11:40 am to provider [facility] , who verbally acknowledged these results. Electronically Signed   By: Macy Mis M.D.   On: 01/03/2021 11:44   DG Chest Port 1 View  Result Date: 01/06/2021 CLINICAL DATA:  Shortness of breath, altered mental status EXAM: PORTABLE CHEST 1 VIEW COMPARISON:  01/05/2021 FINDINGS: Mild elevation of the right hemidiaphragm. No consolidation or edema. No significant pleural effusion. Stable blunting of the right costophrenic angle. Stable cardiomediastinal contours. IMPRESSION: No acute process in the chest. Electronically Signed   By: Macy Mis M.D.   On: 01/06/2021 08:25   DG Chest Port 1 View  Result  Date: 01/05/2021 CLINICAL DATA:  Shortness of breath EXAM: PORTABLE CHEST 1 VIEW COMPARISON:  01/04/2021 FINDINGS: Heart and mediastinal contours are within normal limits. No focal opacities or effusions. No acute bony abnormality. IMPRESSION: No active disease. Electronically Signed   By: Rolm Baptise M.D.   On: 01/05/2021 08:28   DG CHEST PORT 1 VIEW  Result Date: 01/04/2021 CLINICAL DATA:  Cough and fevers EXAM: PORTABLE CHEST 1 VIEW COMPARISON:  01/01/2021, 09/19/2020 FINDINGS: Cardiac shadow is within normal limits. The lungs are well aerated bilaterally. Chronic blunting of the right costophrenic angle is noted stable from prior exams. No focal infiltrate is seen. No bony abnormality is noted. IMPRESSION: No active disease. Electronically Signed   By: Inez Catalina M.D.   On: 01/04/2021 22:35

## 2021-01-06 NOTE — Progress Notes (Signed)
EEG complete - results pending 

## 2021-01-06 NOTE — Progress Notes (Signed)
[place] Progress Note Patient Name: Seth [name] DOB: December 17, 1955 MRN: [id number]   Date of Service  01/06/2021  HPI/Events of Note  Nursing concerned that patient not able to swallow Metoprolol PO.  eICU Interventions  Plan: 1. D/C Metoprolol PO.  2. Metoprolol 2.5 mg IV Q 8 hours. Hold dose for SBP < 105 or HR < 60.     Intervention Category Major Interventions: Other:  Lysle Dingwall 01/06/2021, 9:53 PM

## 2021-01-06 NOTE — Evaluation (Signed)
Occupational Therapy Re-Evaluation Patient Details Name: Seth [name] MRN: [id number] DOB: Dec 29, 1955 Today's Date: 01/06/2021    History of Present Illness Seth [name] is a 65 y.o. male who presents to ED with AMS, 1 fall, and vomiting. Patient admitted with SIRS, AKI, hypoglycemia and dehydration. CT  5/11 (+) acute L SDH. PMHx significant for HTN, ETOH & tobacco use disorder, COVID-19 ~3 mos ago, Hx of CVA, PVD, L transmet amp 09/22/2020, HTN, COPD   Clinical Impression   Re-evaluation performed given changed in status. At time of initial evaluation, patient was A&Ox4, performing bed mobility with supervision A, sit to stand tranfers and short-distance functional mobility with Min guard and use of RW. Patient also able to ambulate to commode in bathroom with RW with Min guard at time of initial evaluation. Patient currently demonstrating R sided deficits including R sided facial droop, RUE weakness/numbness, increased tone, decreased proprioception and decreased AROM in RUE requiring Min A to Mod A for UB ADLs and Mod to Max A grossly for LB ADLs. Patient also limited by deficits listed below including decreased cognition, questionable visual deficits, decreased balance, and decreased activity tolerance and would benefit from continued acute OT services in prep for safe d/c to next level of care.      Follow Up Recommendations  SNF;Supervision/Assistance - 24 hour    Equipment Recommendations  Other (comment) (Defer to next level of care.)    Recommendations for Other Services       Precautions / Restrictions Precautions Precautions: Fall Precaution Comments: orthostatic, RUE weakness, R hemi neglect Restrictions Weight Bearing Restrictions: No      Mobility Bed Mobility Overal bed mobility: Needs Assistance Bed Mobility: Supine to Sit;Sit to Supine     Supine to sit: Min assist;HOB elevated Sit to supine: Min assist   General bed mobility comments: Able to advance BLE  from bed surface to EOB. Min A to elevate trunk with HOB slightly elevated. Requires increased time. Initially reporting dizziness sitting EOB. Able to advance BLE from EOB to bed surface with assist to control descent of trunk.    Transfers Overall transfer level: Needs assistance Equipment used: Rolling Strada (2 wheeled) Transfers: Sit to/from Stand Sit to Stand: Mod assist;From elevated surface         General transfer comment: Mod A with cues for hand placement. Uanble to maintain position of R hand on RW despite hand over hand assist and multimodal cues. R posterior and latearal lean noted.    Balance Overall balance assessment: Needs assistance;History of Falls Sitting-balance support: Single extremity supported;Feet supported Sitting balance-[name] Scale: Poor Sitting balance - Comments: Posterior and R lateral lean requiring multimodal cues to correct. Postural control: Posterior lean;Right lateral lean Standing balance support: Bilateral upper extremity supported;During functional activity Standing balance-[name] Scale: Poor Standing balance comment: Requires external assist. Unable to maintain position of RUE on RW despite hand over hand assist.                           ADL either performed or assessed with clinical judgement   ADL Overall ADL's : Needs assistance/impaired     Grooming: Minimal assistance;Bed level Grooming Details (indicate cue type and reason): Min A to wash face and hands in long sitting.             Lower Body Dressing: Maximal assistance;Bed level Lower Body Dressing Details (indicate cue type and reason): Max A to don footwear in supine.  General ADL Comments: Patient with change in status including RUE and cognitive deficits. Patient Min guard to Min A on initial evaluation. Now requires Min A for UB ADLs and Mod to Max A grossly for LB ADLs.     Vision Baseline Vision/History: Wears glasses Wears Glasses:  At all times Patient Visual Report: No change from baseline Vision Assessment?: Vision impaired- to be further tested in functional context     Perception Perception Perception Tested?: Yes Perception Deficits: Inattention/neglect Inattention/Neglect: Does not attend to right side of body   Praxis      Pertinent Vitals/Pain Pain Assessment: No/denies pain     Hand Dominance Right   Extremity/Trunk Assessment Upper Extremity Assessment Upper Extremity Assessment: RUE deficits/detail RUE Deficits / Details: Maintains RUE internally rotated and flexed at elbow, wrist and digits. Unable to extend digits 1-5 or wrist. RUE Sensation: decreased light touch;decreased proprioception RUE Coordination: decreased fine motor;decreased gross motor   Lower Extremity Assessment Lower Extremity Assessment: Defer to PT evaluation (Does not complain of numbness in RLE. Deficits appear to be more severe in RUE.)   Cervical / Trunk Assessment Cervical / Trunk Assessment: Kyphotic   Communication Communication Communication: No difficulties   Cognition Arousal/Alertness: Awake/alert Behavior During Therapy: Flat affect Overall Cognitive Status: Impaired/Different from baseline Area of Impairment: Memory;Following commands;Awareness;Problem solving                     Memory: Decreased short-term memory Following Commands: Follows one step commands with increased time   Awareness: Intellectual Problem Solving: Slow processing;Requires verbal cues;Requires tactile cues;Decreased initiation General Comments: A&Ox4; requires more than increased time to follow 1-step verbal commands; decreased STM.   General Comments       Exercises     Shoulder Instructions      Home Living Family/patient expects to be discharged to:: Private residence Living Arrangements: Alone Available Help at Discharge: Family;Available PRN/intermittently Type of Home: House Home Access: Level entry      Home Layout: One level     Bathroom Shower/Tub: Teacher, early years/pre: Standard Bathroom Accessibility: Yes   Home Equipment: Environmental consultant - 2 wheels;Cane - single point;Grab bars - tub/shower;Tub bench   Additional Comments: daughter can stay with pt as needed      Prior Functioning/Environment Level of Independence: Independent with assistive device(s)        Comments: uses cane, family taks him to appts (does not drive). Could make small meals for himself. Reports ordering take-out recently 2/2 functional decline.        OT Problem List: Decreased strength;Decreased range of motion;Decreased activity tolerance;Impaired balance (sitting and/or standing);Impaired vision/perception;Decreased coordination;Decreased safety awareness;Decreased cognition;Decreased knowledge of use of DME or AE;Impaired sensation;Impaired tone;Impaired UE functional use      OT Treatment/Interventions: Self-care/ADL training;Therapeutic exercise;Energy conservation;DME and/or AE instruction;Therapeutic activities;Patient/family education;Balance training    OT Goals(Current goals can be found in the care plan section) Acute Rehab OT Goals Patient Stated Goal: No goals stated. OT Goal Formulation: With patient Time For Goal Achievement: 01/17/21 Potential to Achieve Goals: Good  OT Frequency: Min 2X/week   Barriers to D/C: Decreased caregiver support;Inaccessible home environment  Lives alone       Co-evaluation              AM-PAC OT "6 Clicks" Daily Activity     Outcome Measure Help from another person eating meals?: A Little Help from another person taking care of personal grooming?: A Lot Help from another person  toileting, which includes using toliet, bedpan, or urinal?: A Lot Help from another person bathing (including washing, rinsing, drying)?: A Lot Help from another person to put on and taking off regular upper body clothing?: A Lot Help from another person to put on  and taking off regular lower body clothing?: A Lot 6 Click Score: 13   End of Session Equipment Utilized During Treatment: Gait belt;Rolling Beagle Nurse Communication: Mobility status  Activity Tolerance: Patient tolerated treatment well Patient left: in bed;with call bell/phone within reach;with bed alarm set  OT Visit Diagnosis: Unsteadiness on feet (R26.81);Other abnormalities of gait and mobility (R26.89);Muscle weakness (generalized) (M62.81);Hemiplegia and hemiparesis Hemiplegia - Right/Left: Right Hemiplegia - dominant/non-dominant: Non-Dominant Hemiplegia - caused by: Other cerebrovascular disease                Time: 0838-0909 OT Time Calculation (min): 31 min Charges:  OT General Charges $OT Visit: 1 Visit OT Evaluation $OT Re-eval: 1 Re-eval OT Treatments $Self Care/Home Management : 8-22 mins  Dafina Suk H. OTR/L Supplemental OT, Department of rehab services [phone]  Daishaun Ayre R H. 01/06/2021, 2:15 PM

## 2021-01-06 NOTE — Procedures (Signed)
Patient Name: Seth [name]  MRN: [id number]  Epilepsy Attending: Lora Havens  Referring Physician/Provider: Dr [name] Date: 01/06/2021 Duration: 23.31 mins  Patient history: 65 year old male with alcohol use who presented with altered mental status and was found to have left subdural hematoma which did not require any neurosurgical intervention. Had generalized tonic-clonic seizure-like episode this afternoon. EEG to evaluate for seizure  Level of alertness: lethargic   AEDs during EEG study: Ativan  Technical aspects: This EEG study was done with scalp electrodes positioned according to the 10-20 International system of electrode placement. Electrical activity was acquired at a sampling rate of 500Hz  and reviewed with a high frequency filter of 70Hz  and a low frequency filter of 1Hz . EEG data were recorded continuously and digitally stored.   Description: EEG showed continuous generalized low amplitude predominantly 2-3 Hz admixed with intermittent 5- 6 Hz theta slowing.   Hyperventilation and photic stimulation were not performed.     ABNORMALITY - Continuous slow, generalized  IMPRESSION: This study is suggestive of moderate to severe diffuse encephalopathy, nonspecific etiology. No seizures or epileptiform discharges were seen throughout the recording.  Fancy Dunkley [name]

## 2021-01-06 NOTE — Progress Notes (Signed)
Critical Result  HGB 6.9 @0142   Reported by Cleon Gustin @0237   Handed off to Maudie Mercury, RN @240 

## 2021-01-06 NOTE — Consult Note (Addendum)
Neurology Consultation Reason for Consult: seizure Referring Physician: Dr [name]   CC: seizure  History is obtained from: patient, chart review  HPI: Seth [name] is a 65 y.o. 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  His initial work-up was consistent with dehydration, hypoglycemia and possible seizures but no source of infection was identified. CT head was performed on 01/03/2021 which showed acute left subdural hematoma with minimal additional subdural hemorrhage along the falx as well as mass-effect with mild rightward midline shift.  Neurosurgery was consulted, recommended no acute neurosurgical intervention, holding Plavix.  Repeat CT head on 01/04/2021 showed stable size of the hematoma with mass-effect on left frontotemporal and parietal lobes as well as marginally more midline shift to the right.  CT head was repeated twice on 01/05/2021 with stable findings.  This afternoon, patient had a seizure-like episode.  Patient is lethargic and unable to provide any history.  Per RN, patient was sitting in the bed, suddenly had a loud sound/cry.  Nurse quickly walked into his room and saw that he was jerking all over, eyes closed, lasted about 5 minutes, drowsy after that.  Hospitalist was notified, ordered CT head without contrast as well as Keppra 1000 mg.  On the way for CT scan, patient went into V. tach, RN could not feel the pulse and called CODE BLUE.  However spontaneous ROSC was achieved in about 20 to 30 seconds and no chest compressions were performed.  Neurology was consulted for further management.  Of note, per chart review patient drinks about a pint of vodka daily, last alcohol use on 5/2.  Alcohol level on arrival was 0.  Patient has been on CIWA but has not required any Ativan.  ROS:  Unable to obtain due to altered mental status.   Past Medical History:   Diagnosis Date  . Depression   . Enlarged prostate   . Gout   . Hypertension   . Metabolic acidosis 93/71/6967  . Stroke [facility])     Family History  Problem Relation Age of Onset  . Hypertension Mother   . Hypertension Father     Social History:  reports that he has been smoking cigarettes. He has a 42.00 pack-year smoking history. He has never used smokeless tobacco. He reports current alcohol use. He reports current drug use. Drug: Marijuana.   Medications Prior to Admission  Medication Sig Dispense Refill Last Dose  . albuterol (PROVENTIL HFA;VENTOLIN HFA) 108 (90 Base) MCG/ACT inhaler Inhale 2 puffs into the lungs every 6 (six) hours as needed for wheezing or shortness of breath. (Patient not taking: Reported on 01/02/2021) 1 Inhaler 2 Not Taking at Unknown time  . aspirin 81 MG chewable tablet Chew 1 tablet (81 mg total) by mouth daily. (Patient not taking: No sig reported)     . clopidogrel (PLAVIX) 75 MG tablet Take 1 tablet (75 mg total) by mouth daily with breakfast. (Patient not taking: Reported on 01/02/2021) 30 tablet 3 Not Taking at Unknown time  . cyanocobalamin (,VITAMIN B-12,) 1000 MCG/ML injection Inject 1 mL (1,000 mcg total) into the muscle every 30 (thirty) days. (Patient not taking: No sig reported) 1 mL 0   . feeding supplement (ENSURE ENLIVE / ENSURE PLUS) LIQD Take 237 mLs by mouth 3 (three) times daily between meals. (Patient not taking: No sig reported) 237 mL 12   . ferrous sulfate 325 (65 FE) MG  tablet Take 1 tablet (325 mg total) by mouth 2 (two) times daily with a meal. (Patient not taking: No sig reported)  3   . finasteride (PROSCAR) 5 MG tablet Take 1 tablet (5 mg total) by mouth daily. (Patient not taking: No sig reported)     . fluticasone furoate-vilanterol (BREO ELLIPTA) 200-25 MCG/INH AEPB Inhale 1 puff into the lungs daily. (Patient not taking: No sig reported)     . folic acid (FOLVITE) 1 MG tablet Take 1 tablet (1 mg total) by mouth daily. (Patient not  taking: No sig reported) 30 tablet 0   . INCRUSE ELLIPTA 62.5 MCG/INH AEPB INHALE 1 PUFF INTO THE LUNGS DAILY (Patient not taking: No sig reported) 2 each 0   . metoprolol tartrate (LOPRESSOR) 25 MG tablet Take 0.5 tablets (12.5 mg total) by mouth 2 (two) times daily. (Patient not taking: No sig reported)     . Multiple Vitamin (MULTIVITAMIN WITH MINERALS) TABS tablet Take 1 tablet by mouth daily. (Patient not taking: No sig reported) 30 tablet 0   . pantoprazole (PROTONIX) 40 MG tablet Take 1 tablet (40 mg total) by mouth daily. (Patient not taking: No sig reported)     . rosuvastatin (CRESTOR) 10 MG tablet Take 1 tablet (10 mg total) by mouth daily. (Patient not taking: Reported on 01/02/2021)   Not Taking at Unknown time  . thiamine (VITAMIN B-1) 100 MG tablet Take 1 tablet (100 mg total) by mouth daily. (Patient not taking: No sig reported) 30 tablet 0       Exam: Current vital signs: BP 133/75   Pulse 79   Temp 99.4 F (37.4 C) (Oral)   Resp 16   Ht 6\' 3"  (1.905 m)   Wt 62.4 kg   SpO2 100%   BMI 17.19 kg/m  Vital signs in last 24 hours: Temp:  [98 F (36.7 C)-101.6 F (38.7 C)] 99.4 F (37.4 C) (05/12 1030) Pulse Rate:  [79-106] 79 (05/12 1030) Resp:  [14-18] 16 (05/12 1030) BP: (124-161)/(75-99) 133/75 (05/12 1030) SpO2:  [96 %-100 %] 100 % (05/12 1113)   Physical Exam  Constitutional: Appears well-developed, cachectic Psych: Unable to assess due to AMS  eyes: No scleral injection HENT: No OP obstrucion Head: Normocephalic.  Cardiovascular: Normal rate and regular rhythm.  Respiratory: Effort normal, non-labored breathing GI: Soft.  No distension. There is no tenderness.  Skin: Warm Neuro: Opens eyes to repeated tactile stimuli, does not follow commands, able to tell me his name, but was not oriented to place or person, did not name objects, mumbling, appears to have fluctuating increased tone in right upper extremity with flexion at elbow, spontaneously moving all  other extremities with antigravity strength  I have reviewed labs in epic and the results pertinent to this consultation are: CBC:  Recent Labs  Lab 01/05/21 0010 01/06/21 0142  WBC 6.4 6.4  NEUTROABS 3.3 3.1  HGB 7.6* 6.9*  HCT 23.2* 20.3*  MCV 103.1* 100.0  PLT 119* 143*    Basic Metabolic Panel:  Lab Results  Component Value Date   NA 131 (L) 01/06/2021   K 3.7 01/06/2021   CO2 23 01/06/2021   GLUCOSE 93 01/06/2021   BUN 7 (L) 01/06/2021   CREATININE 0.79 01/06/2021   CALCIUM 8.6 (L) 01/06/2021   GFRNONAA >60 01/06/2021   GFRAA >60 06/03/2019   Lipid Panel: No results found for: LDLCALC HgbA1c:  Lab Results  Component Value Date   HGBA1C 4.2 (L) 07/20/2020   Urine  Drug Screen:     Component Value Date/Time   LABOPIA NONE DETECTED 07/20/2020 0219   COCAINSCRNUR NONE DETECTED 07/20/2020 0219   LABBENZ NONE DETECTED 07/20/2020 0219   AMPHETMU NONE DETECTED 07/20/2020 0219   THCU NONE DETECTED 07/20/2020 0219   LABBARB NONE DETECTED 07/20/2020 0219    Alcohol Level     Component Value Date/Time   ETH <10 01/02/2021 0300     I have reviewed the images obtained: CT head without contrast 01/03/2021: Acute left cerebral convexity subdural hemorrhage. Minimal additional subdural hemorrhage along the falx. Mass effect is mild with minor rightward midline shift.  CT head without contrast 5/10/202:Subdural hematoma on the left with essentially stable size and contour compared to the previous study. There remains mass effect on portions of the left frontal, left temporal, anterior left parietal lobes. There is marginally more midline shift to the right comparedto 1 day prior. There is slightly more acute  subdural hematoma in the falx without appreciable mass effect in the falcine region. There is no appreciable intra-axial hemorrhage. There is mild atrophy with mild periventricular small vessel disease. Small chronic appearing infarcts in the cerebellum, stable. No  evident acute infarct. Foci of arterial vascular calcification noted. Areas of paranasal sinus disease noted.  CT head without contrast 01/05/2021: Left-sided subdural hematoma again noted, minimally smaller in the region of the convexity and stable elsewhere. Degree of mass effect stable with 6 mm of midline shift toward the right at the level of the foramen of Monro. Fourth ventricle midline. Stable atrophy with periventricular small vessel disease. Prior cerebellar infarcts appear stable. No acute infarct. No intra-axial mass or hemorrhage. There are foci of arterial vascular calcification. There are foci of paranasal sinus disease.  CT without contrast (repeat) 01/05/2021: Acute left subdural hematoma is essentially unchanged in size from earlier today, maximal dimension 12 mm in the parietal region. Left-to-right midline shift of 5 mm, previously 6 mm. 2. Small amount of subdural blood tracks along the superior falx, unchanged.  CT head without contrast 01/06/2021: No change since yesterday. Left convexity subdural is no larger. Mass-effect is the same. Small amount of blood along the falx is the same.    ASSESSMENT/PLAN: 65 year old male with alcohol use who presented with altered mental status and was found to have left subdural hematoma which did not require any neurosurgical intervention.  Had generalized tonic-clonic seizure-like episode this afternoon.  Convulsion/seizure Acute subdural hematoma Acute encephalopathy, likely postictal Alcohol use -Patient currently lethargic, not following commands.  However by RN, patient has been AO x1 for the last few days. -Encephalopathy could be due to subdural hematoma, postictal state -Seizure most likely due to underlying subdural hematoma.  Low suspicion for alcohol withdrawal as patient's CIWA scores have been 0 and patient has been in the hospital for almost 5 days with reported last alcohol use almost 10 days ago. -CT head stable compared to  yesterday  Recommendations: -Long-term EEG monitoring to look for ictal-interictal abnormality -Agree with Keppra 1000 mg loading dose, will start on 500 mg twice daily for maintenance -Patient had increased tone in right upper extremity, unclear if it is due to seizure because of fluctuating tone. However IV Keppra has been ordered and patient is about to receive it now.  Therefore we will hold off on adding any more medications. -Seizure precautions -as needed IV Ativan 2 mg for clinical seizure-like activity lasting more than 2 minutes -Management of rest of the comorbidities per primary team   Thank you  for allowing [place] to participate in the care of this patient. If you have any further questions, please contact  me or neurohospitalist.    I have spent a total of 120  minutes with the patient reviewing hospital notes,  test results, labs and examining the patient as well as establishing an assessment and plan that was discussed personally with the patient.  > 50% of time was spent in direct patient care.   Zeb Comfort Epilepsy [facility] neurohospitalist

## 2021-01-06 NOTE — Progress Notes (Signed)
Patient ID: Seth [name], male   DOB: 1955-12-17, 65 y.o.   MRN: [id number] CT head reviewed unchanged.  1 patient is stable from medical perspective can be discharged with scheduled follow-up in 1 to 2 weeks with follow-up head CT.

## 2021-01-06 NOTE — Progress Notes (Signed)
Critical Lab Value  Pt HGB is 6.9  Provider Zierie-Gosh,MD was notified via page awaiting orders or recommendation for this pt.

## 2021-01-06 NOTE — Progress Notes (Signed)
This RN was called by pt's bedside nurse to assist with pt who was actively seizing. Upon entering room pt actively seizing. Rapid called and Candiss Norse MD paged. Seizure spontaneously ended and 2mg  Ativan given IV. Pt postictal and unresponsive, but SpO2 100% on room air and maintaining airway. Orders received for STAT head Ct. While waiting in [facility] elevator hallway on 2nd floor, pt went into V-Tach without a pulse and no respirations. Another nurse passing by in the hallway asked how she could help. I asked her to call a code and have them meet [place] in CT room 3. Code was called. Bed was put in CPR mode and pt prepared for CPR while rolling down hallway to CT. Pt then converted self to SVT in 180s and downtrended to 110s. Code team arrived to [facility] room 3 soon after pt. Given ROSC, code team left. Head CT performed and pt returned to unit. Upon returning to unit, Dr. Candiss Norse paged and made aware of what had happened. Orders received to transfer pt to ICU.

## 2021-01-06 NOTE — Progress Notes (Signed)
LTM EEG running.  No initial skin breakdown. Push button tested. Neuro notified.

## 2021-01-07 ENCOUNTER — Inpatient Hospital Stay (HOSPITAL_COMMUNITY): Payer: Medicare Other

## 2021-01-07 DIAGNOSIS — R6510 Systemic inflammatory response syndrome (SIRS) of non-infectious origin without acute organ dysfunction: Secondary | ICD-10-CM | POA: Diagnosis not present

## 2021-01-07 DIAGNOSIS — I472 Ventricular tachycardia: Secondary | ICD-10-CM | POA: Diagnosis not present

## 2021-01-07 DIAGNOSIS — I10 Essential (primary) hypertension: Secondary | ICD-10-CM | POA: Diagnosis present

## 2021-01-07 DIAGNOSIS — E871 Hypo-osmolality and hyponatremia: Secondary | ICD-10-CM

## 2021-01-07 DIAGNOSIS — Z7289 Other problems related to lifestyle: Secondary | ICD-10-CM

## 2021-01-07 DIAGNOSIS — F32A Depression, unspecified: Secondary | ICD-10-CM

## 2021-01-07 DIAGNOSIS — I469 Cardiac arrest, cause unspecified: Secondary | ICD-10-CM

## 2021-01-07 DIAGNOSIS — F1010 Alcohol abuse, uncomplicated: Secondary | ICD-10-CM

## 2021-01-07 DIAGNOSIS — Z8673 Personal history of transient ischemic attack (TIA), and cerebral infarction without residual deficits: Secondary | ICD-10-CM

## 2021-01-07 DIAGNOSIS — N400 Enlarged prostate without lower urinary tract symptoms: Secondary | ICD-10-CM

## 2021-01-07 DIAGNOSIS — Z720 Tobacco use: Secondary | ICD-10-CM

## 2021-01-07 DIAGNOSIS — R569 Unspecified convulsions: Secondary | ICD-10-CM | POA: Diagnosis not present

## 2021-01-07 LAB — CBC WITH DIFFERENTIAL/PLATELET
Abs Immature Granulocytes: 0.05 10*3/uL (ref 0.00–0.07)
Basophils Absolute: 0 10*3/uL (ref 0.0–0.1)
Basophils Relative: 0 %
Eosinophils Absolute: 0.1 10*3/uL (ref 0.0–0.5)
Eosinophils Relative: 1 %
HCT: 26.1 % — ABNORMAL LOW (ref 39.0–52.0)
Hemoglobin: 8.8 g/dL — ABNORMAL LOW (ref 13.0–17.0)
Immature Granulocytes: 1 %
Lymphocytes Relative: 12 %
Lymphs Abs: 1.1 10*3/uL (ref 0.7–4.0)
MCH: 33.3 pg (ref 26.0–34.0)
MCHC: 33.7 g/dL (ref 30.0–36.0)
MCV: 98.9 fL (ref 80.0–100.0)
Monocytes Absolute: 2.3 10*3/uL — ABNORMAL HIGH (ref 0.1–1.0)
Monocytes Relative: 26 %
Neutro Abs: 5.4 10*3/uL (ref 1.7–7.7)
Neutrophils Relative %: 60 %
Platelets: 236 10*3/uL (ref 150–400)
RBC: 2.64 MIL/uL — ABNORMAL LOW (ref 4.22–5.81)
RDW: 17.2 % — ABNORMAL HIGH (ref 11.5–15.5)
WBC: 9 10*3/uL (ref 4.0–10.5)
nRBC: 0.2 % (ref 0.0–0.2)

## 2021-01-07 LAB — CULTURE, BLOOD (ROUTINE X 2)
Culture: NO GROWTH
Special Requests: ADEQUATE

## 2021-01-07 LAB — PROCALCITONIN: Procalcitonin: <0.1 ng/mL

## 2021-01-07 LAB — COMPREHENSIVE METABOLIC PANEL
ALT: 12 U/L (ref 0–44)
AST: 18 U/L (ref 15–41)
Albumin: 2.8 g/dL — ABNORMAL LOW (ref 3.5–5.0)
Alkaline Phosphatase: 66 U/L (ref 38–126)
Anion gap: 7 (ref 5–15)
BUN: 5 mg/dL — ABNORMAL LOW (ref 8–23)
CO2: 23 mmol/L (ref 22–32)
Calcium: 8.7 mg/dL — ABNORMAL LOW (ref 8.9–10.3)
Chloride: 103 mmol/L (ref 98–111)
Creatinine, Ser: 0.88 mg/dL (ref 0.61–1.24)
GFR, Estimated: >60 mL/min (ref >60)
Glucose, Bld: 88 mg/dL (ref 70–99)
Potassium: 4.4 mmol/L (ref 3.5–5.1)
Sodium: 133 mmol/L — ABNORMAL LOW (ref 135–145)
Total Bilirubin: 0.9 mg/dL (ref 0.3–1.2)
Total Protein: 6 g/dL — ABNORMAL LOW (ref 6.5–8.1)

## 2021-01-07 LAB — C-REACTIVE PROTEIN: CRP: 11.6 mg/dL — ABNORMAL HIGH (ref <1.0)

## 2021-01-07 LAB — TYPE AND SCREEN
ABO/RH(D): O POS
Antibody Screen: NEGATIVE
Unit division: 0

## 2021-01-07 LAB — ECHOCARDIOGRAM COMPLETE
AR max vel: 2.69 cm2
AV Area VTI: 3.14 cm2
AV Area mean vel: 2.52 cm2
AV Mean grad: 3 mmHg
AV Peak grad: 4.9 mmHg
Ao pk vel: 1.11 m/s
Area-P 1/2: 5.02 cm2
Height: 75 in
S' Lateral: 2.2 cm
Weight: 2208.13 oz

## 2021-01-07 LAB — BPAM RBC
Blood Product Expiration Date: 202205262359
ISSUE DATE / TIME: 202205120735
Unit Type and Rh: 5100

## 2021-01-07 LAB — GLUCOSE, CAPILLARY
Glucose-Capillary: 95 mg/dL (ref 70–99)
Glucose-Capillary: 88 mg/dL (ref 70–99)

## 2021-01-07 LAB — BRAIN NATRIURETIC PEPTIDE: B Natriuretic Peptide: 150.3 pg/mL — ABNORMAL HIGH (ref 0.0–100.0)

## 2021-01-07 LAB — MAGNESIUM: Magnesium: 2.1 mg/dL (ref 1.7–2.4)

## 2021-01-07 MED ORDER — AMOXICILLIN-POT CLAVULANATE 875-125 MG PO TABS
1.0000 | ORAL_TABLET | Freq: Two times a day (BID) | ORAL | Status: DC
  Administered 2021-01-08 – 2021-01-12 (×10): 1 via ORAL
  Filled 2021-01-07 (×10): qty 1

## 2021-01-07 MED ORDER — AMIODARONE HCL 200 MG PO TABS: 200.0000 mg | ORAL_TABLET | Freq: Every day | ORAL | Status: DC

## 2021-01-07 MED ORDER — METOPROLOL TARTRATE 100 MG PO TABS
100.0000 mg | ORAL_TABLET | Freq: Two times a day (BID) | ORAL | Status: DC
  Administered 2021-01-08 – 2021-01-09 (×4): 100 mg via ORAL
  Filled 2021-01-07 (×4): qty 1

## 2021-01-07 MED ORDER — LEVETIRACETAM 500 MG PO TABS
500.0000 mg | ORAL_TABLET | Freq: Two times a day (BID) | ORAL | Status: DC
  Administered 2021-01-08 (×2): 500 mg via ORAL
  Filled 2021-01-07 (×3): qty 1

## 2021-01-07 MED ORDER — METOPROLOL TARTRATE 12.5 MG HALF TABLET
12.5000 mg | ORAL_TABLET | Freq: Two times a day (BID) | ORAL | Status: DC
  Administered 2021-01-07: 12.5 mg via ORAL
  Filled 2021-01-07: qty 1

## 2021-01-07 NOTE — Progress Notes (Signed)
   01/07/21 1541  Assess: MEWS Score  Temp 99 F (37.2 C)  BP (!) 151/87  Pulse Rate (!) 115  ECG Heart Rate (!) 119  Resp 17  Level of Consciousness Alert  SpO2 100 %  O2 Device Room Air  Assess: MEWS Score  MEWS Temp 0  MEWS Systolic 0  MEWS Pulse 2  MEWS RR 0  MEWS LOC 0  MEWS Score 2  MEWS Score Color Yellow  Assess: if the MEWS score is Yellow or Red  Were vital signs taken at a resting state? Yes  Focused Assessment No change from prior assessment  Early Detection of Sepsis Score *See Row Information* Medium  MEWS guidelines implemented *See Row Information* Yes  Treat  MEWS Interventions Administered scheduled meds/treatments  Pain Scale 0-10  Pain Score 0  Take Vital Signs  Increase Vital Sign Frequency  Yellow: Q 2hr X 2 then Q 4hr X 2, if remains yellow, continue Q 4hrs  Escalate  MEWS: Escalate Yellow: discuss with charge nurse/RN and consider discussing with provider and RRT  Notify: Charge Nurse/RN  Name of Charge Nurse/RN Notified Charlynne Cousins  Date Charge Nurse/RN Notified 01/07/21  Time Charge Nurse/RN Notified 2263  Notify: Provider  Provider Name/Title Hunsucker  Date Provider Notified 01/07/21  Time Provider Notified 1555  Notification Type Page  Notification Reason Other (Comment) (yellow MEWS)  Provider response No new orders  Assess: SIRS CRITERIA  SIRS Temperature  0  SIRS Pulse 1  SIRS Respirations  0  SIRS WBC 0  SIRS Score Sum  1

## 2021-01-07 NOTE — Progress Notes (Signed)
Occupational Therapy Treatment Patient Details Name: Seth [name] MRN: [id number] DOB: [birth year] Today's Date: 01/07/2021    History of present illness Seth [name] is a 65 y.o. male who presents to ED with AMS, 1 fall, and vomiting. Patient admitted with SIRS, AKI, hypoglycemia and dehydration. CT  5/11 (+) acute L SDH. PMHx significant for HTN, ETOH & tobacco use disorder, COVID-19 ~3 mos ago, Hx of CVA, PVD, L transmet amp 09/22/2020, HTN, COPD   OT comments  Pt self feeding in bed with increased spillage. Sat at EOB to complete eating and participated in grooming with min assist. Pt using R UE as an effective assist to hold bowl, wipe his hands and blow his nose. Pt is L hand dominant. Stood with +2 min assist and side stepped with +2 mod assist to [facility]. Positioned pt in bed with it in chair position as he is on continuous EEG. Updated d/c recommendation to CIR.   Follow Up Recommendations  CIR    Equipment Recommendations  3 in 1 bedside commode    Recommendations for Other Services      Precautions / Restrictions Precautions Precautions: Fall Restrictions Weight Bearing Restrictions: Yes LLE Weight Bearing: Non weight bearing       Mobility Bed Mobility Overal bed mobility: Needs Assistance Bed Mobility: Supine to Sit;Sit to Supine     Supine to sit: Min guard;HOB elevated Sit to supine: Min assist   General bed mobility comments: increased time, min assist for L LE back into bed    Transfers Overall transfer level: Needs assistance Equipment used: 2 person hand held assist Transfers: Sit to/from Stand Sit to Stand: +2 physical assistance;Min assist         General transfer comment: +2 min to rise and steady, +2 mod to take side steps to [facility]    Balance Overall balance assessment: Needs assistance;History of Falls Sitting-balance support: No upper extremity supported Sitting balance-[name] Scale: Fair Sitting balance - Comments: no LOB while eating at  EOB   Standing balance support: Bilateral upper extremity supported;During functional activity Standing balance-[name] Scale: Poor Standing balance comment: +2 mod assist                           ADL either performed or assessed with clinical judgement   ADL Overall ADL's : Needs assistance/impaired Eating/Feeding: Minimal assistance;Sitting Eating/Feeding Details (indicate cue type and reason): assist to open containers, pt using R hand to stabilize/hold bowl Grooming: Minimal assistance;Sitting;Wash/dry hands;Wash/dry face Grooming Details (indicate cue type and reason): min assist for thoroughness with R hand                                     Vision       Perception     Praxis      Cognition Arousal/Alertness: Awake/alert Behavior During Therapy: WFL for tasks assessed/performed Overall Cognitive Status: Impaired/Different from baseline Area of Impairment: Memory;Following commands;Awareness;Problem solving;Orientation                 Orientation Level: Time;Situation   Memory: Decreased short-term memory Following Commands: Follows one step commands with increased time   Awareness: Intellectual Problem Solving: Slow processing;Requires verbal cues;Requires tactile cues;Decreased initiation General Comments: not oriented to date or most recent medical events        Exercises     Shoulder Instructions  General Comments      Pertinent Vitals/ Pain       Pain Assessment: Faces Faces Pain Scale: No hurt  Home Living                                          Prior Functioning/Environment              Frequency  Min 2X/week        Progress Toward Goals  OT Goals(current goals can now be found in the care plan section)  Progress towards OT goals: Progressing toward goals  Acute Rehab OT Goals Patient Stated Goal: No goals stated. OT Goal Formulation: With patient Time For Goal  Achievement: 01/17/21 Potential to Achieve Goals: Good  Plan Discharge plan needs to be updated    Co-evaluation    PT/OT/SLP Co-Evaluation/Treatment: Yes Reason for Co-Treatment: Complexity of the patient's impairments (multi-system involvement);For patient/therapist safety   OT goals addressed during session: ADL's and self-care      AM-PAC OT "6 Clicks" Daily Activity     Outcome Measure   Help from another person eating meals?: A Little Help from another person taking care of personal grooming?: A Little Help from another person toileting, which includes using toliet, bedpan, or urinal?: A Lot Help from another person bathing (including washing, rinsing, drying)?: A Lot Help from another person to put on and taking off regular upper body clothing?: A Little Help from another person to put on and taking off regular lower body clothing?: A Lot 6 Click Score: 15    End of Session    OT Visit Diagnosis: Unsteadiness on feet (R26.81);Other abnormalities of gait and mobility (R26.89);Muscle weakness (generalized) (M62.81);Hemiplegia and hemiparesis   Activity Tolerance Patient tolerated treatment well   Patient Left in bed;with call bell/phone within reach;with bed alarm set;with nursing/sitter in room   Nurse Communication Mobility status        Time: 6195-0932 OT Time Calculation (min): 30 min  Charges: OT General Charges $OT Visit: 1 Visit OT Treatments $Self Care/Home Management : 8-22 mins  Nestor Lewandowsky, OTR/L Acute Rehabilitation Services Pager: [phone] Office: [phone]   Malka So 01/07/2021, 10:50 AM

## 2021-01-07 NOTE — Progress Notes (Signed)
Inpatient Rehab Admissions Coordinator Note:   Per therapy recommendations, pt was screened for CIR candidacy by Shann Medal, PT, DPT.  At this time we are recommending a CIR consult and I will request an order per our protocol.  Please contact me with questions.   Shann Medal, PT, DPT [phone] 01/07/21 1:37 PM

## 2021-01-07 NOTE — Progress Notes (Signed)
Seth [name], is a 65 y.o. male, DOB - Feb 06, 1956, [id number]  Patient in ICU under the care of Dr. Silas Flood, patient seen, eating breakfast and undergoing long-term EEG, in no distress feels better.  Denies any headache, still has low-grade fever likely due to small aspiration pneumonia.  Continue Unasyn, SDH and seizures seem to be stable, discussed with Dr. Silas Flood will resume care tomorrow most likely will come out of ICU this afternoon.   Vitals:   01/07/21 0621 01/07/21 0700 01/07/21 0800 01/07/21 0900  BP:  (!) 156/99 (!) 155/103 127/74  Pulse:  96 (!) 107 (!) 102  Resp:  (!) 26 15 16   Temp:   (!) 100.8 F (38.2 C)   TempSrc:   Axillary   SpO2:   100% 97%  Weight: 62.6 kg     Height:            Data Review   Micro Results Recent Results (from the past 240 hour(s))  Culture, blood (Routine x 2)     Status: None   Collection Time: 01/01/21 10:04 PM   Specimen: BLOOD RIGHT HAND  Result Value Ref Range Status   Specimen Description BLOOD RIGHT HAND  Final   Special Requests   Final    BOTTLES DRAWN AEROBIC ONLY Blood Culture results may not be optimal due to an inadequate volume of blood received in culture bottles   Culture   Final    NO GROWTH 5 DAYS Performed at [facility], [place] [address], [place], [place] [address]    Report Status 01/06/2021 FINAL  Final  Resp Panel by RT-PCR (Flu A&B, Covid) Nasopharyngeal Swab     Status: None   Collection Time: 01/01/21 10:09 PM   Specimen: Nasopharyngeal Swab; Nasopharyngeal(NP) swabs in vial transport medium  Result Value Ref Range Status   SARS Coronavirus 2 by RT PCR NEGATIVE NEGATIVE Final    Comment: (NOTE) SARS-CoV-2 target nucleic acids are NOT DETECTED.  The SARS-CoV-2 RNA is generally detectable in upper respiratory specimens during the acute phase of infection. The lowest concentration of SARS-CoV-2 viral copies this  assay can detect is 138 copies/mL. A negative result does not preclude SARS-Cov-2 infection and should not be used as the sole basis for treatment or other patient management decisions. A negative result may occur with  improper specimen collection/handling, submission of specimen other than nasopharyngeal swab, presence of viral mutation(s) within the areas targeted by this assay, and inadequate number of viral copies(<138 copies/mL). A negative result must be combined with clinical observations, patient history, and epidemiological information. The expected result is Negative.  Fact Sheet for Patients:  EntrepreneurPulse.com.au  Fact Sheet for Healthcare Providers:  IncredibleEmployment.be  This test is no t yet approved or cleared by the [place] FDA and  has been authorized for detection and/or diagnosis of SARS-CoV-2 by FDA under an Emergency Use Authorization (EUA). This EUA will remain  in effect (meaning this test can be used) for the duration of the COVID-19 declaration under Section 564(b)(1) of the Act, 21 U.S.C.section 360bbb-3(b)(1), unless the authorization is terminated  or revoked sooner.       Influenza A by PCR NEGATIVE NEGATIVE Final   Influenza B by PCR NEGATIVE NEGATIVE Final    Comment: (NOTE) The Xpert Xpress SARS-CoV-2/FLU/RSV plus assay is intended as an aid in the diagnosis of influenza from Nasopharyngeal swab specimens and should not be used as a sole basis for treatment. Nasal washings and aspirates are unacceptable for  Xpert Xpress SARS-CoV-2/FLU/RSV testing.  Fact Sheet for Patients: EntrepreneurPulse.com.au  Fact Sheet for Healthcare Providers: IncredibleEmployment.be  This test is not yet approved or cleared by the [place] FDA and has been authorized for detection and/or diagnosis of SARS-CoV-2 by FDA under an Emergency Use Authorization (EUA). This EUA will  remain in effect (meaning this test can be used) for the duration of the COVID-19 declaration under Section 564(b)(1) of the Act, 21 U.S.C. section 360bbb-3(b)(1), unless the authorization is terminated or revoked.  Performed at [facility], [place] [address], [place], [place] [address]   Culture, blood (Routine x 2)     Status: None (Preliminary result)   Collection Time: 01/01/21 11:34 PM   Specimen: BLOOD LEFT ARM  Result Value Ref Range Status   Specimen Description BLOOD LEFT ARM  Final   Special Requests   Final    BOTTLES DRAWN AEROBIC AND ANAEROBIC Blood Culture adequate volume   Culture   Final    NO GROWTH 4 DAYS Performed at [facility], [place] [address], [place], [place] [address]    Report Status PENDING  Incomplete  Culture, blood (routine x 2)     Status: None (Preliminary result)   Collection Time: 01/04/21 10:00 PM   Specimen: BLOOD RIGHT HAND  Result Value Ref Range Status   Specimen Description BLOOD RIGHT HAND  Final   Special Requests   Final    BOTTLES DRAWN AEROBIC AND ANAEROBIC Blood Culture adequate volume   Culture   Final    NO GROWTH 2 DAYS Performed at [facility], [place] [address] [place], [place] [address]    Report Status PENDING  Incomplete  Culture, blood (routine x 2)     Status: None (Preliminary result)   Collection Time: 01/04/21 10:09 PM   Specimen: BLOOD RIGHT HAND  Result Value Ref Range Status   Specimen Description BLOOD RIGHT HAND  Final   Special Requests   Final    AEROBIC BOTTLE ONLY Blood Culture results may not be optimal due to an inadequate volume of blood received in culture bottles   Culture   Final    NO GROWTH 2 DAYS Performed at [facility], [place] [address] [place],  [address]    Report Status PENDING  Incomplete    Radiology Reports DG Chest 2 View  Result Date: 01/01/2021 CLINICAL DATA:  Altered mental status and possible sepsis EXAM: CHEST - 2 VIEW COMPARISON:  09/19/2020 FINDINGS:  The heart size and mediastinal contours are within normal limits. Both lungs are clear. The visualized skeletal structures are unremarkable. IMPRESSION: No active cardiopulmonary disease. Electronically Signed   By: Inez Catalina M.D.   On: 01/01/2021 21:44   CT HEAD WO CONTRAST  Result Date: 01/06/2021 CLINICAL DATA:  Followup subdural hematoma EXAM: CT HEAD WITHOUT CONTRAST TECHNIQUE: Contiguous axial images were obtained from the base of the skull through the vertex without intravenous contrast. COMPARISON:  01/05/2021 FINDINGS: Brain: Left lateral convexity subdural hematoma is stable, maximal thickness 10 mm measured on the coronal imaging. No additional bleeding or mass-effect. Left-to-right shift of 4 mm. Tiny amount of subdural blood along the falx is unchanged. No new area of bleeding. No acute ischemic infarction. Old small vessel infarctions of the cerebellum and chronic small-vessel ischemic changes of the cerebral hemispheric white matter. Vascular: No acute vascular finding. Skull: Negative Sinuses/Orbits: Clear/normal Other: None IMPRESSION: No change since yesterday. Left convexity subdural is no larger. Mass-effect is  the same. Small amount of blood along the falx is the same. Electronically Signed   By: Nelson Chimes M.D.   On: 01/06/2021 13:54   CT HEAD WO CONTRAST  Result Date: 01/05/2021 CLINICAL DATA:  Follow-up subdural hematoma. EXAM: CT HEAD WITHOUT CONTRAST TECHNIQUE: Contiguous axial images were obtained from the base of the skull through the vertex without intravenous contrast. COMPARISON:  Earlier today, additional priors. FINDINGS: Brain: Acute left subdural hematoma is essentially unchanged in size from earlier today, maximal dimension 12 mm in the parietal region, series 3, image 11, 11 mm earlier today, and 14 mm yesterday. There is trace subdural blood along the superior falx, unchanged from earlier today. Minimal mass effect on the underlying high convexity. Left-to-right  midline shift of 5 mm, previously 6 mm. No intraparenchymal or subarachnoid hemorrhage. No evidence of developing ischemia. Remote bilateral cerebellar infarcts. Vascular: Atherosclerosis of skullbase vasculature without hyperdense vessel or abnormal calcification. Skull: No fracture or focal lesion. Sinuses/Orbits: No acute findings. Other: None. IMPRESSION: 1. Acute left subdural hematoma is essentially unchanged in size from earlier today, maximal dimension 12 mm in the parietal region. Left-to-right midline shift of 5 mm, previously 6 mm. 2. Small amount of subdural blood tracks along the superior falx, unchanged. Electronically Signed   By: Keith Rake M.D.   On: 01/05/2021 18:30   CT HEAD WO CONTRAST  Result Date: 01/05/2021 CLINICAL DATA:  Recent subdural hematoma EXAM: CT HEAD WITHOUT CONTRAST TECHNIQUE: Contiguous axial images were obtained from the base of the skull through the vertex without intravenous contrast. COMPARISON:  Jan 04, 2021 FINDINGS: Brain: The previously noted acute subdural hematoma along the left frontal and temporal lobes extending superiorly to the level of the left convexity is again noted with a maximum thickness of 1.1 cm in the left convexity region, marginally less thick than on the previous study. Subdural size and contour elsewhere appears essentially stable. There is stable mass effect on portions of the left frontal and temporal lobes with 6 mm of midline shift to the right, stable. No new or enlarging subdural hematoma evident. The slight amount of subdural hematoma in the anterior falx is stable without mass effect. There is no evident intra-axial mass or hemorrhage. Mild diffuse atrophy with mild underlying periventricular small vessel disease noted. Prior cerebellar infarcts are stable, larger on the right than on the left. Note that fourth ventricle is midline. No acute infarct evident. Vascular: No evident hyperdense vessel. Calcification noted in each carotid  siphon region. Skull: Bony calvarium appears intact. Sinuses/Orbits: Stable opacification in several ethmoid air cells and superior right maxillary antrum, stable. Orbits appear symmetric bilaterally. Other: Mastoid air cells clear IMPRESSION: Left-sided subdural hematoma again noted, minimally smaller in the region of the convexity and stable elsewhere. Degree of mass effect stable with 6 mm of midline shift toward the right at the level of the foramen of Monro. Fourth ventricle midline. Stable atrophy with periventricular small vessel disease. Prior cerebellar infarcts appear stable. No acute infarct. No intra-axial mass or hemorrhage. There are foci of arterial vascular calcification. There are foci of paranasal sinus disease. Electronically Signed   By: Lowella Grip III M.D.   On: 01/05/2021 12:29   CT HEAD WO CONTRAST  Result Date: 01/04/2021 CLINICAL DATA:  Subdural hematoma EXAM: CT HEAD WITHOUT CONTRAST TECHNIQUE: Contiguous axial images were obtained from the base of the skull through the vertex without intravenous contrast. COMPARISON:  Jan 03, 2021 FINDINGS: Brain: There is an acute appearing subdural  hematoma again noted along the left frontal and temporal lobes extending superiorly to the level of the left convexity. The maximum thickness of this subdural hematoma in the left convexity region is 1.4 cm, stable by remeasurement. There is stable mass effect on portions of the left frontal, temporal, anterior parietal lobes. At the level of the foramen of Monro, there is 6 mm of midline shift toward the right, slightly increased compared to 1 day prior. There is subdural hematoma tracking along the falx with slightly more hemorrhage in this area compared to previous study. There is no appreciable mass effect along the falx, however. There is no intra-axial hemorrhage or mass. There is underlying mild diffuse atrophy with mild periventricular small vessel disease. There are small prior infarcts in  the cerebellum bilaterally. No acute infarct is appreciable. Vascular: No hyperdense vessel. There is calcification in each carotid siphon region. Skull: The bony calvarium appears intact. Sinuses/Orbits: There is opacification in several ethmoid air cells as well as in the anterior superior right maxillary antrum. Visualized orbits appear symmetric bilaterally. Other: Mastoid air cells are clear. IMPRESSION: Subdural hematoma on the left with essentially stable size and contour compared to the previous study. There remains mass effect on portions of the left frontal, left temporal, anterior left parietal lobes. There is marginally more midline shift to the right compared to 1 day prior. There is slightly more acute subdural hematoma in the falx without appreciable mass effect in the falcine region. There is no appreciable intra-axial hemorrhage. There is mild atrophy with mild periventricular small vessel disease. Small chronic appearing infarcts in the cerebellum, stable. No evident acute infarct. Foci of arterial vascular calcification noted. Areas of paranasal sinus disease noted. Electronically Signed   By: Lowella Grip III M.D.   On: 01/04/2021 08:01   CT HEAD WO CONTRAST  Result Date: 01/03/2021 CLINICAL DATA:  Delirium EXAM: CT HEAD WITHOUT CONTRAST TECHNIQUE: Contiguous axial images were obtained from the base of the skull through the vertex without intravenous contrast. COMPARISON:  09/14/2020 FINDINGS: Brain: Hyperdense subdural hemorrhage is present along the left cerebral convexity measuring up to 9 mm thickness. Small volume hyperdense subdural hemorrhage is also present along the falx. Mass effect is mild with 4 mm of rightward midline shift at the foramen of Monro. 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 Gray-white differentiation is preserved. Prominence of the ventricles and sulci reflects generalized parenchymal volume loss. Chronic bilateral cerebellar  infarcts. Patchy hypoattenuation in the supratentorial white matter is nonspecific but probably reflects stable chronic microvascular ischemic changes. Vascular: There is intracranial atherosclerotic calcification at the skull base Skull: Unremarkable. Sinuses/Orbits: Patchy paranasal sinus mucosal thickening. No acute orbital abnormality. Other: Mastoid air cells are clear. IMPRESSION: Acute left cerebral convexity subdural hemorrhage. Minimal additional subdural hemorrhage along the falx. Mass effect is mild with minor rightward midline shift. Stable chronic findings detailed above. These results were called by telephone at the time of interpretation on 01/03/2021 at 11:40 am to provider [facility] , who verbally acknowledged these results. Electronically Signed   By: Macy Mis M.D.   On: 01/03/2021 11:44   DG Chest Port 1 View  Result Date: 01/06/2021 CLINICAL DATA:  Shortness of breath, altered mental status EXAM: PORTABLE CHEST 1 VIEW COMPARISON:  01/05/2021 FINDINGS: Mild elevation of the right hemidiaphragm. No consolidation or edema. No significant pleural effusion. Stable blunting of the right costophrenic angle. Stable cardiomediastinal contours. IMPRESSION: No acute process in the chest. Electronically Signed  By: Macy Mis M.D.   On: 01/06/2021 08:25   DG Chest Port 1 View  Result Date: 01/05/2021 CLINICAL DATA:  Shortness of breath EXAM: PORTABLE CHEST 1 VIEW COMPARISON:  01/04/2021 FINDINGS: Heart and mediastinal contours are within normal limits. No focal opacities or effusions. No acute bony abnormality. IMPRESSION: No active disease. Electronically Signed   By: Rolm Baptise M.D.   On: 01/05/2021 08:28   DG CHEST PORT 1 VIEW  Result Date: 01/04/2021 CLINICAL DATA:  Cough and fevers EXAM: PORTABLE CHEST 1 VIEW COMPARISON:  01/01/2021, 09/19/2020 FINDINGS: Cardiac shadow is within normal limits. The lungs are well aerated bilaterally. Chronic blunting of the right costophrenic  angle is noted stable from prior exams. No focal infiltrate is seen. No bony abnormality is noted. IMPRESSION: No active disease. Electronically Signed   By: Inez Catalina M.D.   On: 01/04/2021 22:35   EEG adult  Result Date: 01/06/2021 Lora Havens, MD     01/06/2021  3:09 PM Patient Name: Seth [name] MRN: [id number] Epilepsy Attending: Lora Havens Referring Physician/Provider: Dr [name] Date: 01/06/2021 Duration: 23.31 mins Patient history: 65 year old male with alcohol use who presented with altered mental status and was found to have left subdural hematoma which did not require any neurosurgical intervention. Had generalized tonic-clonic seizure-like episode this afternoon. EEG to evaluate for seizure Level of alertness: lethargic AEDs during EEG study: Ativan Technical aspects: This EEG study was done with scalp electrodes positioned according to the 10-20 International system of electrode placement. Electrical activity was acquired at a sampling rate of 500Hz  and reviewed with a high frequency filter of 70Hz  and a low frequency filter of 1Hz . EEG data were recorded continuously and digitally stored. Description: EEG showed continuous generalized low amplitude predominantly 2-3 Hz admixed with intermittent 5- 6 Hz theta slowing.   Hyperventilation and photic stimulation were not performed.   ABNORMALITY - Continuous slow, generalized IMPRESSION: This study is suggestive of moderate to severe diffuse encephalopathy, nonspecific etiology. No seizures or epileptiform discharges were seen throughout the recording. Seth [name]   Overnight EEG with video  Result Date: 01/07/2021 Lora Havens, MD     01/07/2021  9:08 AM Patient Name: Seth [name] MRN: [id number] Epilepsy Attending: Lora Havens Referring Physician/Provider: Dr Zeb Comfort Duration: 01/06/2021 1447 to 01/07/2021 0845  Patient history: 65 year old male with alcohol use who presented with altered mental status  and was found to have left subdural hematoma which did not require any neurosurgical intervention. Had generalized tonic-clonic seizure-like episode this afternoon. EEG to evaluate for seizure  Level of alertness: lethargic  AEDs during EEG study: Ativan, LEV  Technical aspects: This EEG study was done with scalp electrodes positioned according to the 10-20 International system of electrode placement. Electrical activity was acquired at a sampling rate of 500Hz  and reviewed with a high frequency filter of 70Hz  and a low frequency filter of 1Hz . EEG data were recorded continuously and digitally stored.  Description: The posterior dominant rhythm consists of 8 Hz activity of moderate voltage (25-35 uV) seen predominantly in posterior head regions, symmetric and reactive to eye opening and eye closing. Sleep was characterized by vertex waves, sleep spindles (12 to 14 Hz), maximal frontocentral region. EEG initially showed continuous generalized low amplitude predominantly 2-3 Hz admixed with intermittent 5- 6 Hz theta slowing which gradually improved to intermittent 3-5hz  theta-delta slowing. Sharp transients were seen in left fronto-centro-temporal region. Hyperventilation and photic stimulation were not performed.  ABNORMALITY - Continuous slow, generalized - Intermittent slow, generalized  IMPRESSION: This study was initially suggestive of moderate diffuse encephalopathy which gradually improved to mild diffuse encephalopathy, nonspecific etiology but likely related to postictal state. No seizures or definite epileptiform discharges were seen throughout the recording.  Lora Havens    CBC Recent Labs  Lab 01/03/21 0405 01/04/21 0314 01/05/21 0010 01/06/21 0142 01/06/21 1518 01/07/21 0059  WBC 6.6 6.0 6.4 6.4 6.9 9.0  HGB 8.1* 7.7* 7.6* 6.9* 8.9* 8.8*  HCT 23.7* 23.0* 23.2* 20.3* 26.4* 26.1*  PLT 93* 91* 119* 143* 213 236  MCV 99.6 101.3* 103.1* 100.0 100.0 98.9  MCH 34.0 33.9 33.8 34.0  33.7 33.3  MCHC 34.2 33.5 32.8 34.0 33.7 33.7  RDW 17.0* 17.2* 17.3* 16.9* 17.1* 17.2*  LYMPHSABS 1.2 1.3 1.5 1.5  --  1.1  MONOABS 0.8 0.9 1.5* 1.6*  --  2.3*  EOSABS 0.0 0.0 0.1 0.1  --  0.1  BASOSABS 0.0 0.0 0.0 0.0  --  0.0    Chemistries  Recent Labs  Lab 01/03/21 0405 01/04/21 0314 01/04/21 1401 01/05/21 0010 01/06/21 0142 01/06/21 1518 01/07/21 0059  NA 135 134*  --  134* 131* 133* 133*  K 2.9* 2.7* 3.7 3.6 3.7 3.9 4.4  CL 98 100  --  103 100 101 103  CO2 27 27  --  26 23 24 23   GLUCOSE 120* 102*  --  97 93 96 88  BUN 6* <5*  --  7* 7* 7* 5*  CREATININE 0.81 0.69  --  0.78 0.79 0.76 0.88  CALCIUM 8.8* 8.3*  --  8.7* 8.6* 9.1 8.7*  MG 1.6* 2.2  --  2.0 1.7  --  2.1  AST 44* 33  --  25 19  --  18  ALT 17 15  --  12 12  --  12  ALKPHOS 68 72  --  70 60  --  66  BILITOT 1.0 1.1  --  0.8 0.7  --  0.9   ------------------------------------------------------------------------------------------------------------------ estimated creatinine clearance is 76.1 mL/min (by C-G formula based on SCr of 0.88 mg/dL). ------------------------------------------------------------------------------------------------------------------ No results for input(s): HGBA1C in the last 72 hours. ------------------------------------------------------------------------------------------------------------------ No results for input(s): CHOL, HDL, LDLCALC, TRIG, CHOLHDL, LDLDIRECT in the last 72 hours. ------------------------------------------------------------------------------------------------------------------ No results for input(s): TSH, T4TOTAL, T3FREE, THYROIDAB in the last 72 hours.  Invalid input(s): FREET3 ------------------------------------------------------------------------------------------------------------------ No results for input(s): VITAMINB12, FOLATE, FERRITIN, TIBC, IRON, RETICCTPCT in the last 72 hours.  Coagulation profile Recent Labs  Lab 01/01/21 2124  INR 0.9     No results for input(s): DDIMER in the last 72 hours.  Cardiac Enzymes No results for input(s): CKMB, TROPONINI, MYOGLOBIN in the last 168 hours.  Invalid input(s): CK ------------------------------------------------------------------------------------------------------------------ Invalid input(s): POCBNP   Signature  [name] M.D on 01/07/2021 at 9:45 AM   -  To page go to [email or web address]
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
## 2021-01-07 NOTE — Progress Notes (Signed)
Physical Therapy Treatment Patient Details Name: Seth [name] MRN: [id number] DOB: [birth year] Today's Date: 01/07/2021    History of Present Illness Seth [name] is a 65 y.o. male who presents to ED with AMS, 1 fall, and vomiting. Patient admitted with SIRS, AKI, hypoglycemia and dehydration. CT  5/11 (+) acute L SDH.  5/12  he had acute change in mental status and seizure and right hemibody weakness.   EEG 5/12-513.  Right hemibody weakness resolved 5/13.  PMHx significant for HTN, ETOH & tobacco use disorder, COVID-19 ~3 mos ago, Hx of CVA, PVD, L transmet amp 09/22/2020, HTN, COPD    PT Comments    Pt admitted with above diagnosis. Pt was able to sit EOB with supervision and eat his breakfast.  Then stood and took steps to [facility].  Still hooked up to EEB therefore limited to bed for the most part today.  Pt requires min to mod assist as he is unsteady when on his feet.  Given that pt has had setbacks while here, feel that CIR may be warranted.  Updated d/c plan for CIR.  Will continue to follow acutely.  Pt currently with functional limitations due to balance and endurance deficits. Pt will benefit from skilled PT to increase their independence and safety with mobility to allow discharge to the venue listed below.     Follow Up Recommendations  CIR     Equipment Recommendations  None recommended by PT    Recommendations for Other Services OT consult     Precautions / Restrictions Precautions Precautions: Fall Precaution Comments: orthostatic Restrictions Weight Bearing Restrictions: No    Mobility  Bed Mobility Overal bed mobility: Needs Assistance Bed Mobility: Supine to Sit;Sit to Supine     Supine to sit: Min guard;HOB elevated Sit to supine: Min assist   General bed mobility comments: increased time, min assist for L LE back into bed    Transfers Overall transfer level: Needs assistance Equipment used: 2 person hand held assist Transfers: Sit to/from Stand Sit to  Stand: +2 physical assistance;Min assist         General transfer comment: +2 min to rise and steady, +2 mod to take side steps to [facility]  Ambulation/Gait                 Stairs             Wheelchair Mobility    Modified Rankin (Stroke Patients Only)       Balance Overall balance assessment: Needs assistance;History of Falls Sitting-balance support: No upper extremity supported Sitting balance-[name] Scale: Fair Sitting balance - Comments: no LOB while eating at EOB   Standing balance support: Bilateral upper extremity supported;During functional activity Standing balance-[name] Scale: Poor Standing balance comment: +2 mod assist                            Cognition Arousal/Alertness: Awake/alert Behavior During Therapy: WFL for tasks assessed/performed Overall Cognitive Status: Impaired/Different from baseline Area of Impairment: Memory;Following commands;Awareness;Problem solving;Orientation                 Orientation Level: Time;Situation   Memory: Decreased short-term memory Following Commands: Follows one step commands with increased time   Awareness: Intellectual Problem Solving: Slow processing;Requires verbal cues;Requires tactile cues;Decreased initiation General Comments: not oriented to date or most recent medical events      Exercises      General Comments General comments (skin integrity,  edema, etc.): VSS      Pertinent Vitals/Pain Pain Assessment: No/denies pain Faces Pain Scale: No hurt    Home Living                      Prior Function            PT Goals (current goals can now be found in the care plan section) Acute Rehab PT Goals Patient Stated Goal: No goals stated. Progress towards PT goals: Progressing toward goals    Frequency    Min 3X/week      PT Plan Frequency needs to be updated;Discharge plan needs to be updated    Co-evaluation PT/OT/SLP Co-Evaluation/Treatment:  Yes Reason for Co-Treatment: Complexity of the patient's impairments (multi-system involvement);For patient/therapist safety PT goals addressed during session: Mobility/safety with mobility OT goals addressed during session: ADL's and self-care      AM-PAC PT "6 Clicks" Mobility   Outcome Measure  Help needed turning from your back to your side while in a flat bed without using bedrails?: None Help needed moving from lying on your back to sitting on the side of a flat bed without using bedrails?: A Little Help needed moving to and from a bed to a chair (including a wheelchair)?: A Lot Help needed standing up from a chair using your arms (e.g., wheelchair or bedside chair)?: A Lot Help needed to walk in hospital room?: A Lot Help needed climbing 3-5 steps with a railing? : A Lot 6 Click Score: 15    End of Session Equipment Utilized During Treatment: Gait belt Activity Tolerance: Patient tolerated treatment well Patient left: in bed;with bed alarm set;with call bell/phone within reach Nurse Communication: Mobility status PT Visit Diagnosis: Unsteadiness on feet (R26.81);Repeated falls (R29.6);Muscle weakness (generalized) (M62.81);Dizziness and giddiness (R42)     Time: 0272-5366 PT Time Calculation (min) (ACUTE ONLY): 30 min  Charges:  $Therapeutic Activity: 8-22 mins                     Merwyn Hodapp M,PT Acute Rehab Services [phone] (pager)   Alvira Philips 01/07/2021, 1:20 PM

## 2021-01-07 NOTE — Procedures (Addendum)
Patient Name: Seth [name]  MRN: [id number]  Epilepsy Attending: Lora Havens  Referring Physician/Provider: Dr Zeb Comfort Duration: 01/06/2021 1447 to 01/07/2021 1136  Patient history: 65 year old male with alcohol use who presented with altered mental status and was found to have left subdural hematoma which did not require any neurosurgical intervention. Had generalized tonic-clonic seizure-like episode this afternoon. EEG to evaluate for seizure  Level of alertness: lethargic   AEDs during EEG study: Ativan, LEV  Technical aspects: This EEG study was done with scalp electrodes positioned according to the 10-20 International system of electrode placement. Electrical activity was acquired at a sampling rate of 500Hz  and reviewed with a high frequency filter of 70Hz  and a low frequency filter of 1Hz . EEG data were recorded continuously and digitally stored.   Description: The posterior dominant rhythm consists of 8 Hz activity of moderate voltage (25-35 uV) seen predominantly in posterior head regions, symmetric and reactive to eye opening and eye closing. Sleep was characterized by vertex waves, sleep spindles (12 to 14 Hz), maximal frontocentral region. EEG initially showed continuous generalized low amplitude predominantly 2-3 Hz admixed with intermittent 5- 6 Hz theta slowing which gradually improved to intermittent 3-5hz  theta-delta slowing. Sharp transients were seen in left fronto-centro-temporal region. Hyperventilation and photic stimulation were not performed.     ABNORMALITY - Continuous slow, generalized - Intermittent slow, generalized  IMPRESSION: This study was initially suggestive of moderate diffuse encephalopathy which gradually improved to mild diffuse encephalopathy, nonspecific etiology but likely related to postictal state. No seizures or definite epileptiform discharges were seen throughout the recording.  Fread Kottke [name]

## 2021-01-07 NOTE — Progress Notes (Signed)
vLTM EEG complete. No skin breakdown was seen

## 2021-01-07 NOTE — Progress Notes (Signed)
LTM maintenance completed; no skin breakdown was seen; tested event button with [facility].

## 2021-01-07 NOTE — Progress Notes (Signed)
Patient transferred to 5W from 104M. Patient is alert and oriented x4. Vital signs are stable, HR is elevated in the 110s and he is on room air. Has no complaints of pain. Skin is intact, patient does have toes on L foot amputated, a large bruise on the L flank and a small abrasion on the back of the L shoulder. Patient belongings at bedside (clothing, glasses). The patient was shown how to use the call bell. Call bell, phone and bedside table are within reach; bed is in the lowest position.

## 2021-01-07 NOTE — Progress Notes (Addendum)
Subjective: No further seizures overnight.  States he is feeling well today.  Asking when he can go home  ROS: negative except above   Examination  Vital signs in last 24 hours: Temp:  [98.3 F (36.8 C)-101.8 F (38.8 C)] 100.8 F (38.2 C) (05/13 0800) Pulse Rate:  [86-125] 102 (05/13 0900) Resp:  [14-32] 16 (05/13 0900) BP: (124-200)/(74-124) 127/74 (05/13 0900) SpO2:  [97 %-100 %] 97 % (05/13 0900) Weight:  [62.6 kg] 62.6 kg (05/13 0621)  General: Sitting in bed, not in apparent distress CVS: pulse-normal rate and rhythm RS: breathing comfortably Extremities: normal  Neuro: Awake, alert, oriented to self and place, time: 2011, able to name objects, cranial nerves II to XII grossly intact, 4/5 in right upper extremity, 5/5 in all other extremities  Basic Metabolic Panel: Recent Labs  Lab 01/03/21 0405 01/04/21 0314 01/04/21 1401 01/05/21 0010 01/06/21 0142 01/06/21 1518 01/07/21 0059  NA 135 134*  --  134* 131* 133* 133*  K 2.9* 2.7* 3.7 3.6 3.7 3.9 4.4  CL 98 100  --  103 100 101 103  CO2 27 27  --  26 23 24 23   GLUCOSE 120* 102*  --  97 93 96 88  BUN 6* <5*  --  7* 7* 7* 5*  CREATININE 0.81 0.69  --  0.78 0.79 0.76 0.88  CALCIUM 8.8* 8.3*  --  8.7* 8.6* 9.1 8.7*  MG 1.6* 2.2  --  2.0 1.7  --  2.1  PHOS 1.3* 2.6  --  2.4* 3.7 3.8  --     CBC: Recent Labs  Lab 01/03/21 0405 01/04/21 0314 01/05/21 0010 01/06/21 0142 01/06/21 1518 01/07/21 0059  WBC 6.6 6.0 6.4 6.4 6.9 9.0  NEUTROABS 4.5 3.6 3.3 3.1  --  5.4  HGB 8.1* 7.7* 7.6* 6.9* 8.9* 8.8*  HCT 23.7* 23.0* 23.2* 20.3* 26.4* 26.1*  MCV 99.6 101.3* 103.1* 100.0 100.0 98.9  PLT 93* 91* 119* 143* 213 236     Coagulation Studies: No results for input(s): LABPROT, INR in the last 72 hours.  Imaging No new brain imaging overnight   ASSESSMENT AND PLAN: 65 year old male with alcohol use who presented with altered mental status and was found to have left subdural hematoma which did not require any  neurosurgical intervention.  Had generalized tonic-clonic seizure-like episode this afternoon.  Acute subdural hematoma with right upper extremity hemiparesis Acute symptomatic seizure Acute encephalopathy, resolved Alcohol use -Seizure most likely due to underlying subdural hematoma.  Low suspicion for alcohol withdrawal as patient's CIWA scores have been 0 and patient has been in the hospital for almost 5 days with reported last alcohol use almost 10 days ago.   Recommendations: -DC LTM EEG as patient did not have any any further seizures overnight -Continue Keppra 500 mg twice daily -Seizure precautions including do not drive -as needed IV Ativan 2 mg for clinical seizure-like activity lasting more than 2 minutes -Recommend follow-up with neurology in 8 to 12 weeks.  As this was a symptomatic seizure (within 2 weeks of subdural hematoma), patient might not need lifelong antiseizure medications. -Management of rest of the comorbidities per primary team  Seizure precautions: Per [facility] statutes, patients with seizures are not allowed to drive until they have been seizure-free for six months and cleared by a physician    Use caution when using heavy equipment or power tools. Avoid working on ladders or at heights. Take showers instead of baths. Ensure the water temperature is  not too high on the home water heater. Do not go swimming alone. Do not lock yourself in a room alone (i.e. bathroom). When caring for infants or small children, sit down when holding, feeding, or changing them to minimize risk of injury to the child in the event you have a seizure. Maintain good sleep hygiene. Avoid alcohol.    If patient has another seizure, call 911 and bring them back to the ED if: A.  The seizure lasts longer than 5 minutes.      B.  The patient doesn't wake shortly after the seizure or has new problems such as difficulty seeing, speaking or moving following the seizure C.  The patient  was injured during the seizure D.  The patient has a temperature over 102 F (39C) E.  The patient vomited during the seizure and now is having trouble breathing    During the Seizure   - First, ensure adequate ventilation and place patients on the floor on their left side  Loosen clothing around the neck and ensure the airway is patent. If the patient is clenching the teeth, do not force the mouth open with any object as this can cause severe damage - Remove all items from the surrounding that can be hazardous. The patient may be oblivious to what's happening and may not even know what he or she is doing. If the patient is confused and wandering, either gently guide him/her away and block access to outside areas - Reassure the individual and be comforting - Call 911. In most cases, the seizure ends before EMS arrives. However, there are cases when seizures may last over 3 to 5 minutes. Or the individual may have developed breathing difficulties or severe injuries. If a pregnant patient or a person with diabetes develops a seizure, it is prudent to call an ambulance. - Finally, if the patient does not regain full consciousness, then call EMS. Most patients will remain confused for about 45 to 90 minutes after a seizure, so you must use judgment in calling for help. - Avoid restraints but make sure the patient is in a bed with padded side rails - Place the individual in a lateral position with the neck slightly flexed; this will help the saliva drain from the mouth and prevent the tongue from falling backward - Remove all nearby furniture and other hazards from the area - Provide verbal assurance as the individual is regaining consciousness - Provide the patient with privacy if possible - Call for help and start treatment as ordered by the caregiver    After the Seizure (Postictal Stage)   After a seizure, most patients experience confusion, fatigue, muscle pain and/or a headache. Thus, one  should permit the individual to sleep. For the next few days, reassurance is essential. Being calm and helping reorient the person is also of importance.   Most seizures are painless and end spontaneously. Seizures are not harmful to others but can lead to complications such as stress on the lungs, brain and the heart. Individuals with prior lung problems may develop labored breathing and respiratory distress.   I have spent a total of 35  minutes with the patient reviewing hospital notes,  test results, labs and examining the patient as well as establishing an assessment and plan that was discussed personally with the patient.  > 50% of time was spent in direct patient care.     [facility] For questions after 5pm please refer to [facility]  to reach the Neurologist on call
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
## 2021-01-07 NOTE — Progress Notes (Signed)
  Speech Language Pathology Treatment: Dysphagia  Patient Details Name: Seth [name] MRN: [id number] DOB: 12-Jun-1956 Today's Date: 01/07/2021 Time: 2703-5009 SLP Time Calculation (min) (ACUTE ONLY): 22.23 min  Assessment / Plan / Recommendation Clinical Impression  Pt was seen for dysphagia treatment. He was alert and cooperative during the session. Pt stated that his gums are sore that he therefore has been eating soft solids. Dentures were in place during throughout the session. He consumed mixed consistency boluses (i.e., peaches with liquid) and thin liquids via straw. No s/sx of aspiration were noted with solids or liquids. Pt required cues for reduced bolus size. Mastication was mildly prolonged with peaches. Moderate residue was noted in both lateral sulci during this session, but more significantly to the left. Pt was able to remove some residue with a cued lingual sweep, but a liquid wash was also necessary and oral suctioning was used to remove two additional pieces. A diet upgrade is not clinically indicated at this time. Pt's current of dysphagia 2 solids and thin liquids will be continued. SLP will continue to follow pt.    HPI HPI: Seth [name] is a 65 y.o. male  presents with Acute mental status decline - likely on by alcohol abuse, poor oral intake causing hypoglycemia, hypomagnesemia, dehydration, AKI and hyperkalemia, with possible DTs. CT showing SDH. PMH significant for history of CVA, PVD s/p LLE angiography with DES placed to popliteal artery, left foot osteomyelitis s/p transmetatarsal hypertension 09/22/2020, COPD, hypertension, alcohol use, and tobacco use.      SLP Plan  Continue with current plan of care       Recommendations  Diet recommendations: Dysphagia 2 (fine chop);Thin liquid Liquids provided via: Cup;Straw Medication Administration: Whole meds with puree Supervision: Intermittent supervision to cue for compensatory strategies Compensations: Slow  rate;Small sips/bites;Follow solids with liquid;Other (Comment) (Check for pocketing on both sides) Postural Changes and/or Swallow Maneuvers: Seated upright 90 degrees                Oral Care Recommendations: Oral care before and after PO Follow up Recommendations: Home health SLP SLP Visit Diagnosis: Dysphagia, oral phase (R13.11) Plan: Continue with current plan of care       Cing  I. Seth [name], [place], [place] Office number [phone] Pager [place] 01/07/2021, 5:35 PM

## 2021-01-08 ENCOUNTER — Inpatient Hospital Stay (HOSPITAL_COMMUNITY): Payer: Medicare Other

## 2021-01-08 DIAGNOSIS — R6510 Systemic inflammatory response syndrome (SIRS) of non-infectious origin without acute organ dysfunction: Secondary | ICD-10-CM | POA: Diagnosis not present

## 2021-01-08 LAB — BRAIN NATRIURETIC PEPTIDE: B Natriuretic Peptide: 263.9 pg/mL — ABNORMAL HIGH (ref 0.0–100.0)

## 2021-01-08 LAB — COMPREHENSIVE METABOLIC PANEL
ALT: 10 U/L (ref 0–44)
AST: 17 U/L (ref 15–41)
Albumin: 3 g/dL — ABNORMAL LOW (ref 3.5–5.0)
Alkaline Phosphatase: 64 U/L (ref 38–126)
Anion gap: 11 (ref 5–15)
BUN: 5 mg/dL — ABNORMAL LOW (ref 8–23)
CO2: 21 mmol/L — ABNORMAL LOW (ref 22–32)
Calcium: 8.8 mg/dL — ABNORMAL LOW (ref 8.9–10.3)
Chloride: 96 mmol/L — ABNORMAL LOW (ref 98–111)
Creatinine, Ser: 0.89 mg/dL (ref 0.61–1.24)
GFR, Estimated: >60 mL/min (ref >60)
Glucose, Bld: 90 mg/dL (ref 70–99)
Potassium: 3.7 mmol/L (ref 3.5–5.1)
Sodium: 128 mmol/L — ABNORMAL LOW (ref 135–145)
Total Bilirubin: 1.2 mg/dL (ref 0.3–1.2)
Total Protein: 6.4 g/dL — ABNORMAL LOW (ref 6.5–8.1)

## 2021-01-08 LAB — OSMOLALITY, URINE: Osmolality, Ur: 163 mOsm/kg — ABNORMAL LOW (ref 300–900)

## 2021-01-08 LAB — URINALYSIS, ROUTINE W REFLEX MICROSCOPIC
Bilirubin Urine: NEGATIVE
Glucose, UA: NEGATIVE mg/dL
Hgb urine dipstick: NEGATIVE
Ketones, ur: NEGATIVE mg/dL
Leukocytes,Ua: NEGATIVE
Nitrite: NEGATIVE
Protein, ur: NEGATIVE mg/dL
Specific Gravity, Urine: 1.004 — ABNORMAL LOW (ref 1.005–1.030)
pH: 8 (ref 5.0–8.0)

## 2021-01-08 LAB — URIC ACID: Uric Acid, Serum: 5.3 mg/dL (ref 3.7–8.6)

## 2021-01-08 LAB — CBC WITH DIFFERENTIAL/PLATELET
Abs Immature Granulocytes: 0.06 10*3/uL (ref 0.00–0.07)
Basophils Absolute: 0 10*3/uL (ref 0.0–0.1)
Basophils Relative: 0 %
Eosinophils Absolute: 0.1 10*3/uL (ref 0.0–0.5)
Eosinophils Relative: 1 %
HCT: 27 % — ABNORMAL LOW (ref 39.0–52.0)
Hemoglobin: 9.2 g/dL — ABNORMAL LOW (ref 13.0–17.0)
Immature Granulocytes: 1 %
Lymphocytes Relative: 13 %
Lymphs Abs: 1.2 10*3/uL (ref 0.7–4.0)
MCH: 33.6 pg (ref 26.0–34.0)
MCHC: 34.1 g/dL (ref 30.0–36.0)
MCV: 98.5 fL (ref 80.0–100.0)
Monocytes Absolute: 2.5 10*3/uL — ABNORMAL HIGH (ref 0.1–1.0)
Monocytes Relative: 28 %
Neutro Abs: 5.1 10*3/uL (ref 1.7–7.7)
Neutrophils Relative %: 57 %
Platelets: 361 10*3/uL (ref 150–400)
RBC: 2.74 MIL/uL — ABNORMAL LOW (ref 4.22–5.81)
RDW: 16.7 % — ABNORMAL HIGH (ref 11.5–15.5)
WBC: 9 10*3/uL (ref 4.0–10.5)
nRBC: 0 % (ref 0.0–0.2)

## 2021-01-08 LAB — OSMOLALITY: Osmolality: 269 mOsm/kg — ABNORMAL LOW (ref 275–295)

## 2021-01-08 LAB — SODIUM, URINE, RANDOM: Sodium, Ur: 41 mmol/L

## 2021-01-08 LAB — MAGNESIUM: Magnesium: 1.6 mg/dL — ABNORMAL LOW (ref 1.7–2.4)

## 2021-01-08 LAB — C-REACTIVE PROTEIN: CRP: 16.4 mg/dL — ABNORMAL HIGH (ref <1.0)

## 2021-01-08 LAB — PROCALCITONIN: Procalcitonin: <0.1 ng/mL

## 2021-01-08 LAB — PHOSPHORUS: Phosphorus: 3.2 mg/dL (ref 2.5–4.6)

## 2021-01-08 MED ORDER — LEVETIRACETAM 100 MG/ML PO SOLN
500.0000 mg | Freq: Two times a day (BID) | ORAL | Status: DC
  Administered 2021-01-08 – 2021-01-11 (×6): 500 mg via ORAL
  Filled 2021-01-08 (×8): qty 5

## 2021-01-08 MED ORDER — MAGNESIUM SULFATE 4 GM/100ML IV SOLN
4.0000 g | Freq: Once | INTRAVENOUS | Status: AC
  Administered 2021-01-08: 4 g via INTRAVENOUS
  Filled 2021-01-08: qty 100

## 2021-01-08 MED ORDER — LACTATED RINGERS IV SOLN: INTRAVENOUS | Status: AC

## 2021-01-08 MED ORDER — LEVETIRACETAM 500 MG PO TABS
500.0000 mg | ORAL_TABLET | Freq: Two times a day (BID) | ORAL | Status: DC
  Administered 2021-01-10 – 2021-01-13 (×4): 500 mg via ORAL
  Filled 2021-01-08 (×8): qty 1

## 2021-01-08 NOTE — Progress Notes (Addendum)
Inpatient Rehab Admissions:  Inpatient Rehab Consult received.  I met with patient at the bedside for rehabilitation assessment and to discuss goals and expectations of an inpatient rehab admission.  Pt acknowledged understanding of CIR goals and expectations. Pt interested in pursuing CIR. Pt gave permission to contact sister, [name] and daughter, [name]. Left message for [facility].  Able to talk to [place].  She verified that she will be able to assist pt after discharge from hospital.  Will continue to follow.    ADDENDUM:  Pt's sister, [name] returned call. She notified AC that she nor [name] will be able to provide 24/7 care.  Physiatrist who performed consult is anticipating supervision goals which warrant 24/7 care. Therefore, pt is not an appropriate candidate for CIR.  TOC made aware.  Will sign off.    Signed: Gayland Curry, [place], [place] Admissions Coordinator [phone]

## 2021-01-08 NOTE — Progress Notes (Signed)
During shift report, I discussed with the day shift nurse the patient's swallowing difficulty and the patient's request to have all of his meds crushed.  She informed me the patient pocketed a significant amount of his breakfast this am. I am very concerned about this and would appreciate further SLP insight because the patient has meds that are large, but cannot be crushed per the [facility]. I will continue to follow SLP recommendations and monitor for further swallowing difficulties and aspiration. Dr. [place] Crane made aware this am and directed me to follow SLP recommendations.

## 2021-01-08 NOTE — Progress Notes (Addendum)
PROGRESS NOTE                                                                                                                                                                                                             Patient Demographics:    Seth [name], is a 65 y.o. male, DOB - March 08, 1956, [id number]  Outpatient Primary MD for the patient is Lucianne Lei, MD    LOS - 6  Admit date - 01/01/2021    Chief Complaint  Patient presents with  . Altered Mental Status       Brief Narrative (HPI from H&P)  - Seth [name] is a 65 y.o. male with medical history significant for history of CVA, PVD s/p LLE angiography with DES placed to popliteal artery, left foot osteomyelitis s/p transmetatarsal hypertension 09/22/2020, COPD, hypertension, alcohol use, and tobacco use who presents to the ED for evaluation of altered mental status, in the ER his work-up was consistent with dehydration, hypoglycemia and SDH.  His course was complicated by development of aspiration pneumonias & febrile seizures (SDH).  Putatively stayed in ICU for a day.   Subjective:   Patient in bed, appears comfortable, denies any headache, no fever, no chest pain or pressure, no shortness of breath but does have a productive cough, no abdominal pain. No focal weakness.   Assessment  & Plan :     1. Acute mental status decline - likely on by alcohol abuse, poor oral intake causing hypoglycemia, hypomagnesemia, dehydration, AKI and hyperkalemia, with possible DTs. CT showing SDH.  He has been extensively counseled to quit alcohol intake, hydrate with IV fluids, DTs have resolved.  For SDH he was seen by neurosurgeon Dr. Saintclair Halsted, multiple scans are stable.  Plavix for now has been discontinued, low-dose aspirin upon discharge.  Post discharge he needs to follow-up with Dr. Phillip Heal again for a repeat CT scan.  2.  Asp. PNA due to DTs  - chest x-ray noted, currently on  Augmentin continued, speech therapy following on dysphagia 2 diet.  3.  History of CVA.  Supportive care continue ASA with statin.  4.  Seizures likely due to fever from aspiration pneumonia on 01/05/2021 in the setting of SDH.  Appreciate neurology input, EEG nonacute, on Keppra continue.  5.  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  Supportive care. Was on DAPT with statin.  Case discussed with his vascular surgeon Dr. Donzetta Matters on 04/2021, continue aspirin and statin for now.  Can hold Plavix indefinitely if needed for SDH.  6.  Alcohol abuse.  Counseled to quit last drink on 12/27/2020 ?,  He is slightly more confused on 01/03/2021 and could be going into early DTs, will monitor on CIWA protocol.  7.  COPD.  Stable on supportive care.  8.  Ongoing history of smoking.  Counseled to quit on Nico Derm patch.  9. Hypokalemia - replaced.  10. Asp. PNA -chest x-ray noted, currently on Augmentin continued, speech therapy following on dysphagia 2 diet.   11. Macrocytic anemia.  Borderline B12 levels, B12 replaced, given a unit of packed RBC on 01/06/2021. .  12.  Hyponatremia.  Appears dehydrated clinically, IV fluids, ordered serum Osm along with urinary banality and electrolytes.  13.  Hypomagnesemia.  Replaced.  14. ? V.Tach in the CT scan department.  Questionable true event versus artifact, echo shows a preserved EF, on high-dose beta-blocker.  Monitor electrolytes.  Symptom-free.       Condition - Extremely Guarded  Family Communication  : Sister Seth [name] 01/03/21, 01/06/21  Code Status :  Full  Consults  :  Nuerosurgery Dr Saintclair Halsted, PCCM  PUD Prophylaxis : PPI   Procedures  :     TTE - 1. Left ventricular ejection fraction, by estimation, is 60 to 65%. The left ventricle has normal function. The left ventricle has no regional wall motion abnormalities. There is mild left  ventricular hypertrophy.  Left ventricular diastolic parameters are consistent with Grade I diastolic dysfunction (impaired relaxation).  2. Right ventricular systolic function is normal. The right ventricular size is normal.  3. Left atrial size was mild to moderately dilated.  4. The mitral valve is normal in structure. Trivial mitral valve regurgitation. No evidence of mitral stenosis.  5. The aortic valve was not well visualized. There is mild calcification of the aortic valve. Aortic valve regurgitation is not visualized. Mild aortic valve sclerosis is present, with no evidence of aortic valve stenosis.  6. The inferior vena cava is normal in size with greater than 50% respiratory variability, suggesting right atrial pressure of 3 mmHg.  Prolonged EEG - Non acute  CT Head - Non Acute x 4      Disposition Plan  :    Status is:   Dispo: The patient is from: Home              Anticipated d/c is to: Home              Patient currently is not medically stable to d/c.   Difficult to place patient No   DVT Prophylaxis  :    Place and maintain sequential compression device Start: 01/03/21 1148 Place TED hose Start: 01/03/21 0658    Lab Results  Component Value Date   PLT 361 01/08/2021    Diet :  Diet Order            DIET DYS 2 Room service appropriate? Yes; Fluid consistency: Thin  Diet effective now                  Inpatient Medications  Scheduled Meds: . sodium chloride   Intravenous Once  . amoxicillin-clavulanate  1 tablet Oral Q12H  . aspirin  81 mg Oral Daily  .  Chlorhexidine Gluconate Cloth  6 each Topical Daily  . feeding supplement  1 Container Oral TID BM  . ferrous sulfate  325 mg Oral BID WC  . finasteride  5 mg Oral Daily  . fluticasone furoate-vilanterol  1 puff Inhalation Daily  . folic acid  1 mg Oral Daily  . levETIRAcetam  500 mg Oral BID  . metoprolol tartrate  100 mg Oral BID  . multivitamin with minerals  1 tablet Oral Daily  .  nicotine  21 mg Transdermal Daily  . rosuvastatin  10 mg Oral Daily  . sodium chloride flush  10-40 mL Intracatheter Q12H  . thiamine  100 mg Oral Daily  . vitamin B-12  1,000 mcg Oral Daily   Continuous Infusions: . magnesium sulfate bolus IVPB     PRN Meds:.acetaminophen, acetaminophen **OR** [DISCONTINUED] acetaminophen, albuterol, cloNIDine, dextrose, guaiFENesin, LORazepam, [DISCONTINUED] ondansetron **OR** ondansetron (ZOFRAN) IV  Antibiotics  :    Anti-infectives (From admission, onward)   Start     Dose/Rate Route Frequency Ordered Stop   01/07/21 2030  amoxicillin-clavulanate (AUGMENTIN) 875-125 MG per tablet 1 tablet        1 tablet Oral Every 12 hours 01/07/21 1944     01/05/21 1800  Ampicillin-Sulbactam (UNASYN) 3 g in sodium chloride 0.9 % 100 mL IVPB  Status:  Discontinued        3 g 200 mL/hr over 30 Minutes Intravenous Every 6 hours 01/05/21 1657 01/07/21 1034   01/01/21 2330  vancomycin (VANCOREADY) IVPB 1250 mg/250 mL        1,250 mg 166.7 mL/hr over 90 Minutes Intravenous  Once 01/01/21 2302 01/02/21 0301   01/01/21 2315  piperacillin-tazobactam (ZOSYN) IVPB 3.375 g        3.375 g 12.5 mL/hr over 240 Minutes Intravenous  Once 01/01/21 2302 01/02/21 0404       Time Spent in minutes  30   [name] M.D on 01/08/2021 at 11:30 AM  To page go to [email or web address]   [facility] -  Office  [phone]    See all Orders from today for further details    Objective:   Vitals:   01/08/21 0000 01/08/21 0400 01/08/21 0409 01/08/21 0806  BP: (!) 175/95 139/79  110/76  Pulse: (!) 112 94  92  Resp: 18 18  16   Temp: 99.5 F (37.5 C) 99.1 F (37.3 C)  98 F (36.7 C)  TempSrc: Oral Oral  Axillary  SpO2: 99% 98%  100%  Weight:   65.5 kg   Height:        Wt Readings from Last 3 Encounters:  01/08/21 65.5 kg  10/04/20 67.1 kg  09/27/20 67.3 kg     Intake/Output Summary (Last 24 hours) at 01/08/2021 1130 Last data filed at 01/08/2021 1019 Gross  per 24 hour  Intake 634.76 ml  Output 660 ml  Net -25.24 ml     Physical Exam  Awake Alert, No new F.N deficits, Normal affect [place].AT,PERRAL Supple Neck,No JVD, No cervical lymphadenopathy appriciated.  Symmetrical Chest wall movement, Good air movement bilaterally, CTAB RRR,No Gallops, Rubs or new Murmurs, No Parasternal Heave +ve B.Sounds, Abd Soft, No tenderness, No organomegaly appriciated, No rebound - guarding or rigidity. 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    Data Review:    CBC Recent Labs  Lab 01/04/21 0314 01/05/21 0010 01/06/21 0142 01/06/21 1518 01/07/21 0059 01/08/21 0718  WBC 6.0 6.4 6.4 6.9 9.0 9.0  HGB 7.7* 7.6* 6.9* 8.9* 8.8* 9.2*  HCT 23.0* 23.2* 20.3* 26.4* 26.1* 27.0*  PLT 91* 119* 143* 213 236 361  MCV 101.3* 103.1* 100.0 100.0 98.9 98.5  MCH 33.9 33.8 34.0 33.7 33.3 33.6  MCHC 33.5 32.8 34.0 33.7 33.7 34.1  RDW 17.2* 17.3* 16.9* 17.1* 17.2* 16.7*  LYMPHSABS 1.3 1.5 1.5  --  1.1 1.2  MONOABS 0.9 1.5* 1.6*  --  2.3* 2.5*  EOSABS 0.0 0.1 0.1  --  0.1 0.1  BASOSABS 0.0 0.0 0.0  --  0.0 0.0    Recent Labs  Lab 01/01/21 2124 01/01/21 2134 01/01/21 2334 01/02/21 0300 01/04/21 0314 01/04/21 1401 01/04/21 2205 01/05/21 0010 01/05/21 0758 01/06/21 0142 01/06/21 1518 01/07/21 0059 01/08/21 0718  NA 135  --   --    < > 134*  --   --  134*  --  131* 133* 133* 128*  K 5.6*  --   --    < > 2.7*   < >  --  3.6  --  3.7 3.9 4.4 3.7  CL 107  --   --    < > 100  --   --  103  --  100 101 103 96*  CO2 10*  --   --    < > 27  --   --  26  --  23 24 23  21*  GLUCOSE 64*  --   --    < > 102*  --   --  97  --  93 96 88 90  BUN 14  --   --    < > <5*  --   --  7*  --  7* 7* 5* 5*  CREATININE 1.51*  --   --    < > 0.69  --   --  0.78  --  0.79 0.76 0.88 0.89  CALCIUM 6.6*  --   --    < > 8.3*  --   --  8.7*  --  8.6* 9.1 8.7* 8.8*  AST 46*  --   --    < > 33  --   --  25  --  19  --  18 17  ALT 14  --   --    < > 15  --   --  12  --  12   --  12 10  ALKPHOS 65  --   --    < > 72  --   --  70  --  60  --  66 64  BILITOT 3.2*  --   --    < > 1.1  --   --  0.8  --  0.7  --  0.9 1.2  ALBUMIN 2.7*  --   --    < > 2.9*  --   --  2.8*  --  2.6*  --  2.8* 3.0*  MG  --   --   --    < > 2.2  --   --  2.0  --  1.7  --  2.1 1.6*  CRP  --   --   --   --   --   --   --   --   --   --   --  11.6* 16.4*  PROCALCITON  --   --   --    < > <0.10  --  <0.10  --  <0.10 <0.10  --  <0.10  --   LATICACIDVEN  --  2.8* 2.6*  --   --   --  3.0* 1.2  --   --   --   --   --   INR 0.9  --   --   --   --   --   --   --   --   --   --   --   --   BNP  --   --   --    < > 204.5*  --   --  162.8*  --  308.1*  --  150.3* 263.9*   < > = values in this interval not displayed.    ------------------------------------------------------------------------------------------------------------------ No results for input(s): CHOL, HDL, LDLCALC, TRIG, CHOLHDL, LDLDIRECT in the last 72 hours.  Lab Results  Component Value Date   HGBA1C 4.2 (L) 07/20/2020   ------------------------------------------------------------------------------------------------------------------ No results for input(s): TSH, T4TOTAL, T3FREE, THYROIDAB in the last 72 hours.  Invalid input(s): FREET3  Cardiac Enzymes No results for input(s): CKMB, TROPONINI, MYOGLOBIN in the last 168 hours.  Invalid input(s): CK ------------------------------------------------------------------------------------------------------------------    Component Value Date/Time   BNP 263.9 (H) 01/08/2021 [id number]    Micro Results Recent Results (from the past 240 hour(s))  Culture, blood (Routine x 2)     Status: None   Collection Time: 01/01/21 10:04 PM   Specimen: BLOOD RIGHT HAND  Result Value Ref Range Status   Specimen Description BLOOD RIGHT HAND  Final   Special Requests   Final    BOTTLES DRAWN AEROBIC ONLY Blood Culture results may not be optimal due to an inadequate volume of blood received in culture  bottles   Culture   Final    NO GROWTH 5 DAYS Performed at [facility], [place] [address], [place], [place] [address]    Report Status 01/06/2021 FINAL  Final  Resp Panel by RT-PCR (Flu A&B, Covid) Nasopharyngeal Swab     Status: None   Collection Time: 01/01/21 10:09 PM   Specimen: Nasopharyngeal Swab; Nasopharyngeal(NP) swabs in vial transport medium  Result Value Ref Range Status   SARS Coronavirus 2 by RT PCR NEGATIVE NEGATIVE Final    Comment: (NOTE) SARS-CoV-2 target nucleic acids are NOT DETECTED.  The SARS-CoV-2 RNA is generally detectable in upper respiratory specimens during the acute phase of infection. The lowest concentration of SARS-CoV-2 viral copies this assay can detect is 138 copies/mL. A negative result does not preclude SARS-Cov-2 infection and should not be used as the sole basis for treatment or other patient management decisions. A negative result may occur with  improper specimen collection/handling, submission of specimen other than nasopharyngeal swab, presence of viral mutation(s) within the areas targeted by this assay, and inadequate number of viral copies(<138 copies/mL). A negative result must be combined with clinical observations, patient history, and epidemiological information. The expected result is Negative.  Fact Sheet for Patients:  EntrepreneurPulse.com.au  Fact Sheet for Healthcare Providers:  IncredibleEmployment.be  This test is no t yet approved or cleared by the [place] FDA and  has been authorized for detection and/or diagnosis of SARS-CoV-2 by FDA under an Emergency Use Authorization (EUA). This EUA will remain  in effect (meaning this test can be used) for the duration of the COVID-19 declaration under Section 564(b)(1) of the Act, 21 U.S.C.section 360bbb-3(b)(1), unless the authorization is terminated  or revoked sooner.       Influenza A by PCR NEGATIVE NEGATIVE Final   Influenza  B by PCR NEGATIVE NEGATIVE Final  Comment: (NOTE) The Xpert Xpress SARS-CoV-2/FLU/RSV plus assay is intended as an aid in the diagnosis of influenza from Nasopharyngeal swab specimens and should not be used as a sole basis for treatment. Nasal washings and aspirates are unacceptable for Xpert Xpress SARS-CoV-2/FLU/RSV testing.  Fact Sheet for Patients: EntrepreneurPulse.com.au  Fact Sheet for Healthcare Providers: IncredibleEmployment.be  This test is not yet approved or cleared by the [place] FDA and has been authorized for detection and/or diagnosis of SARS-CoV-2 by FDA under an Emergency Use Authorization (EUA). This EUA will remain in effect (meaning this test can be used) for the duration of the COVID-19 declaration under Section 564(b)(1) of the Act, 21 U.S.C. section 360bbb-3(b)(1), unless the authorization is terminated or revoked.  Performed at [facility], [place] [address] [place], [place] [address]   Culture, blood (Routine x 2)     Status: None   Collection Time: 01/01/21 11:34 PM   Specimen: BLOOD LEFT ARM  Result Value Ref Range Status   Specimen Description BLOOD LEFT ARM  Final   Special Requests   Final    BOTTLES DRAWN AEROBIC AND ANAEROBIC Blood Culture adequate volume   Culture   Final    NO GROWTH 5 DAYS Performed at [facility], [place] [address] [place], [place] [address]    Report Status 01/07/2021 FINAL  Final  Culture, blood (routine x 2)     Status: None (Preliminary result)   Collection Time: 01/04/21 10:00 PM   Specimen: BLOOD RIGHT HAND  Result Value Ref Range Status   Specimen Description BLOOD RIGHT HAND  Final   Special Requests   Final    BOTTLES DRAWN AEROBIC AND ANAEROBIC Blood Culture adequate volume   Culture   Final    NO GROWTH 3 DAYS Performed at [facility], [place] [address] [place], [place] [address]    Report Status PENDING  Incomplete  Culture, blood (routine x 2)      Status: None (Preliminary result)   Collection Time: 01/04/21 10:09 PM   Specimen: BLOOD RIGHT HAND  Result Value Ref Range Status   Specimen Description BLOOD RIGHT HAND  Final   Special Requests   Final    AEROBIC BOTTLE ONLY Blood Culture results may not be optimal due to an inadequate volume of blood received in culture bottles   Culture   Final    NO GROWTH 3 DAYS Performed at [facility], [place] [address] [place], [place] [address]    Report Status PENDING  Incomplete    Radiology Reports  DG Chest 2 View  Result Date: 01/01/2021 CLINICAL DATA:  Altered mental status and possible sepsis EXAM: CHEST - 2 VIEW COMPARISON:  09/19/2020 FINDINGS: The heart size and mediastinal contours are within normal limits. Both lungs are clear. The visualized skeletal structures are unremarkable. IMPRESSION: No active cardiopulmonary disease. Electronically Signed   By: Inez Catalina M.D.   On: 01/01/2021 21:44   CT HEAD WO CONTRAST  Result Date: 01/06/2021 CLINICAL DATA:  Followup subdural hematoma EXAM: CT HEAD WITHOUT CONTRAST TECHNIQUE: Contiguous axial images were obtained from the base of the skull through the vertex without intravenous contrast. COMPARISON:  01/05/2021 FINDINGS: Brain: Left lateral convexity subdural hematoma is stable, maximal thickness 10 mm measured on the coronal imaging. No additional bleeding or mass-effect. Left-to-right shift of 4 mm. Tiny amount of subdural blood along the falx is unchanged. No new area of bleeding. No acute ischemic infarction. Old small vessel  infarctions of the cerebellum and chronic small-vessel ischemic changes of the cerebral hemispheric white matter. Vascular: No acute vascular finding. Skull: Negative Sinuses/Orbits: Clear/normal Other: None IMPRESSION: No change since yesterday. Left convexity subdural is no larger. Mass-effect is the same. Small amount of blood along the falx is the same. Electronically Signed   By: Nelson Chimes M.D.   On:  01/06/2021 13:54   CT HEAD WO CONTRAST  Result Date: 01/05/2021 CLINICAL DATA:  Follow-up subdural hematoma. EXAM: CT HEAD WITHOUT CONTRAST TECHNIQUE: Contiguous axial images were obtained from the base of the skull through the vertex without intravenous contrast. COMPARISON:  Earlier today, additional priors. FINDINGS: Brain: Acute left subdural hematoma is essentially unchanged in size from earlier today, maximal dimension 12 mm in the parietal region, series 3, image 11, 11 mm earlier today, and 14 mm yesterday. There is trace subdural blood along the superior falx, unchanged from earlier today. Minimal mass effect on the underlying high convexity. Left-to-right midline shift of 5 mm, previously 6 mm. No intraparenchymal or subarachnoid hemorrhage. No evidence of developing ischemia. Remote bilateral cerebellar infarcts. Vascular: Atherosclerosis of skullbase vasculature without hyperdense vessel or abnormal calcification. Skull: No fracture or focal lesion. Sinuses/Orbits: No acute findings. Other: None. IMPRESSION: 1. Acute left subdural hematoma is essentially unchanged in size from earlier today, maximal dimension 12 mm in the parietal region. Left-to-right midline shift of 5 mm, previously 6 mm. 2. Small amount of subdural blood tracks along the superior falx, unchanged. Electronically Signed   By: Keith Rake M.D.   On: 01/05/2021 18:30   CT HEAD WO CONTRAST  Result Date: 01/05/2021 CLINICAL DATA:  Recent subdural hematoma EXAM: CT HEAD WITHOUT CONTRAST TECHNIQUE: Contiguous axial images were obtained from the base of the skull through the vertex without intravenous contrast. COMPARISON:  Jan 04, 2021 FINDINGS: Brain: The previously noted acute subdural hematoma along the left frontal and temporal lobes extending superiorly to the level of the left convexity is again noted with a maximum thickness of 1.1 cm in the left convexity region, marginally less thick than on the previous study. Subdural  size and contour elsewhere appears essentially stable. There is stable mass effect on portions of the left frontal and temporal lobes with 6 mm of midline shift to the right, stable. No new or enlarging subdural hematoma evident. The slight amount of subdural hematoma in the anterior falx is stable without mass effect. There is no evident intra-axial mass or hemorrhage. Mild diffuse atrophy with mild underlying periventricular small vessel disease noted. Prior cerebellar infarcts are stable, larger on the right than on the left. Note that fourth ventricle is midline. No acute infarct evident. Vascular: No evident hyperdense vessel. Calcification noted in each carotid siphon region. Skull: Bony calvarium appears intact. Sinuses/Orbits: Stable opacification in several ethmoid air cells and superior right maxillary antrum, stable. Orbits appear symmetric bilaterally. Other: Mastoid air cells clear IMPRESSION: Left-sided subdural hematoma again noted, minimally smaller in the region of the convexity and stable elsewhere. Degree of mass effect stable with 6 mm of midline shift toward the right at the level of the foramen of Monro. Fourth ventricle midline. Stable atrophy with periventricular small vessel disease. Prior cerebellar infarcts appear stable. No acute infarct. No intra-axial mass or hemorrhage. There are foci of arterial vascular calcification. There are foci of paranasal sinus disease. Electronically Signed   By: Lowella Grip III M.D.   On: 01/05/2021 12:29   CT HEAD WO CONTRAST  Result Date: 01/04/2021 CLINICAL DATA:  Subdural hematoma EXAM: CT HEAD WITHOUT CONTRAST TECHNIQUE: Contiguous axial images were obtained from the base of the skull through the vertex without intravenous contrast. COMPARISON:  Jan 03, 2021 FINDINGS: Brain: There is an acute appearing subdural hematoma again noted along the left frontal and temporal lobes extending superiorly to the level of the left convexity. The maximum  thickness of this subdural hematoma in the left convexity region is 1.4 cm, stable by remeasurement. There is stable mass effect on portions of the left frontal, temporal, anterior parietal lobes. At the level of the foramen of Monro, there is 6 mm of midline shift toward the right, slightly increased compared to 1 day prior. There is subdural hematoma tracking along the falx with slightly more hemorrhage in this area compared to previous study. There is no appreciable mass effect along the falx, however. There is no intra-axial hemorrhage or mass. There is underlying mild diffuse atrophy with mild periventricular small vessel disease. There are small prior infarcts in the cerebellum bilaterally. No acute infarct is appreciable. Vascular: No hyperdense vessel. There is calcification in each carotid siphon region. Skull: The bony calvarium appears intact. Sinuses/Orbits: There is opacification in several ethmoid air cells as well as in the anterior superior right maxillary antrum. Visualized orbits appear symmetric bilaterally. Other: Mastoid air cells are clear. IMPRESSION: Subdural hematoma on the left with essentially stable size and contour compared to the previous study. There remains mass effect on portions of the left frontal, left temporal, anterior left parietal lobes. There is marginally more midline shift to the right compared to 1 day prior. There is slightly more acute subdural hematoma in the falx without appreciable mass effect in the falcine region. There is no appreciable intra-axial hemorrhage. There is mild atrophy with mild periventricular small vessel disease. Small chronic appearing infarcts in the cerebellum, stable. No evident acute infarct. Foci of arterial vascular calcification noted. Areas of paranasal sinus disease noted. Electronically Signed   By: Lowella Grip III M.D.   On: 01/04/2021 08:01   CT HEAD WO CONTRAST  Result Date: 01/03/2021 CLINICAL DATA:  Delirium EXAM: CT HEAD  WITHOUT CONTRAST TECHNIQUE: Contiguous axial images were obtained from the base of the skull through the vertex without intravenous contrast. COMPARISON:  09/14/2020 FINDINGS: Brain: Hyperdense subdural hemorrhage is present along the left cerebral convexity measuring up to 9 mm thickness. Small volume hyperdense subdural hemorrhage is also present along the falx. Mass effect is mild with 4 mm of rightward midline shift at the foramen of Monro. 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 Gray-white differentiation is preserved. Prominence of the ventricles and sulci reflects generalized parenchymal volume loss. Chronic bilateral cerebellar infarcts. Patchy hypoattenuation in the supratentorial white matter is nonspecific but probably reflects stable chronic microvascular ischemic changes. Vascular: There is intracranial atherosclerotic calcification at the skull base Skull: Unremarkable. Sinuses/Orbits: Patchy paranasal sinus mucosal thickening. No acute orbital abnormality. Other: Mastoid air cells are clear. IMPRESSION: Acute left cerebral convexity subdural hemorrhage. Minimal additional subdural hemorrhage along the falx. Mass effect is mild with minor rightward midline shift. Stable chronic findings detailed above. These results were called by telephone at the time of interpretation on 01/03/2021 at 11:40 am to provider [facility] , who verbally acknowledged these results. Electronically Signed   By: Macy Mis M.D.   On: 01/03/2021 11:44   DG Chest Port 1 View  Result Date: 01/08/2021 CLINICAL DATA:  Shob/ Altered mental status Evaluate edema. pleural effusion. EXAM: PORTABLE  CHEST - 1 VIEW COMPARISON:  01/06/2021 FINDINGS: Interstitial opacities at the right lung base are more conspicuous than on prior study. Left lung remains clear. Heart size and mediastinal contours are within normal limits. No effusion.  No pneumothorax. Cervical fixation hardware partially visualized.  IMPRESSION: 1. Developing infiltrate or atelectasis at the right lung base. Electronically Signed   By: Lucrezia Europe M.D.   On: 01/08/2021 10:49   DG Chest Port 1 View  Result Date: 01/06/2021 CLINICAL DATA:  Shortness of breath, altered mental status EXAM: PORTABLE CHEST 1 VIEW COMPARISON:  01/05/2021 FINDINGS: Mild elevation of the right hemidiaphragm. No consolidation or edema. No significant pleural effusion. Stable blunting of the right costophrenic angle. Stable cardiomediastinal contours. IMPRESSION: No acute process in the chest. Electronically Signed   By: Macy Mis M.D.   On: 01/06/2021 08:25   DG Chest Port 1 View  Result Date: 01/05/2021 CLINICAL DATA:  Shortness of breath EXAM: PORTABLE CHEST 1 VIEW COMPARISON:  01/04/2021 FINDINGS: Heart and mediastinal contours are within normal limits. No focal opacities or effusions. No acute bony abnormality. IMPRESSION: No active disease. Electronically Signed   By: Rolm Baptise M.D.   On: 01/05/2021 08:28   DG CHEST PORT 1 VIEW  Result Date: 01/04/2021 CLINICAL DATA:  Cough and fevers EXAM: PORTABLE CHEST 1 VIEW COMPARISON:  01/01/2021, 09/19/2020 FINDINGS: Cardiac shadow is within normal limits. The lungs are well aerated bilaterally. Chronic blunting of the right costophrenic angle is noted stable from prior exams. No focal infiltrate is seen. No bony abnormality is noted. IMPRESSION: No active disease. Electronically Signed   By: Inez Catalina M.D.   On: 01/04/2021 22:35   EEG adult  Result Date: 01/06/2021 Lora Havens, MD     01/06/2021  3:09 PM Patient Name: Seth [name] MRN: [id number] Epilepsy Attending: Lora Havens Referring Physician/Provider: Dr [name] Date: 01/06/2021 Duration: 23.31 mins Patient history: 65 year old male with alcohol use who presented with altered mental status and was found to have left subdural hematoma which did not require any neurosurgical intervention. Had generalized tonic-clonic  seizure-like episode this afternoon. EEG to evaluate for seizure Level of alertness: lethargic AEDs during EEG study: Ativan Technical aspects: This EEG study was done with scalp electrodes positioned according to the 10-20 International system of electrode placement. Electrical activity was acquired at a sampling rate of 500Hz  and reviewed with a high frequency filter of 70Hz  and a low frequency filter of 1Hz . EEG data were recorded continuously and digitally stored. Description: EEG showed continuous generalized low amplitude predominantly 2-3 Hz admixed with intermittent 5- 6 Hz theta slowing.   Hyperventilation and photic stimulation were not performed.   ABNORMALITY - Continuous slow, generalized IMPRESSION: This study is suggestive of moderate to severe diffuse encephalopathy, nonspecific etiology. No seizures or epileptiform discharges were seen throughout the recording. Priyanka [name]   Overnight EEG with video  Result Date: 01/07/2021 Lora Havens, MD     01/07/2021  1:04 PM Patient Name: Seth [name] MRN: [id number] Epilepsy Attending: Lora Havens Referring Physician/Provider: Dr Zeb Comfort Duration: 01/06/2021 1447 to 01/07/2021 1136  Patient history: 65 year old male with alcohol use who presented with altered mental status and was found to have left subdural hematoma which did not require any neurosurgical intervention. Had generalized tonic-clonic seizure-like episode this afternoon. EEG to evaluate for seizure  Level of alertness: lethargic  AEDs during EEG study: Ativan, LEV  Technical aspects: This EEG study was done with scalp  electrodes positioned according to the 10-20 International system of electrode placement. Electrical activity was acquired at a sampling rate of 500Hz  and reviewed with a high frequency filter of 70Hz  and a low frequency filter of 1Hz . EEG data were recorded continuously and digitally stored.  Description: The posterior dominant rhythm consists of 8 Hz  activity of moderate voltage (25-35 uV) seen predominantly in posterior head regions, symmetric and reactive to eye opening and eye closing. Sleep was characterized by vertex waves, sleep spindles (12 to 14 Hz), maximal frontocentral region. EEG initially showed continuous generalized low amplitude predominantly 2-3 Hz admixed with intermittent 5- 6 Hz theta slowing which gradually improved to intermittent 3-5hz  theta-delta slowing. Sharp transients were seen in left fronto-centro-temporal region. Hyperventilation and photic stimulation were not performed.    ABNORMALITY - Continuous slow, generalized - Intermittent slow, generalized  IMPRESSION: This study was initially suggestive of moderate diffuse encephalopathy which gradually improved to mild diffuse encephalopathy, nonspecific etiology but likely related to postictal state. No seizures or definite epileptiform discharges were seen throughout the recording.  Lora Havens   ECHOCARDIOGRAM COMPLETE  Result Date: 01/07/2021    ECHOCARDIOGRAM REPORT   Patient Name:   Seth [name] Date of Exam: 01/07/2021 Medical Rec #:  [id number]       Height:       75.0 in Accession #:    [id number]      Weight:       138.0 lb Date of Birth:  1955-09-18        BSA:          1.874 m Patient Age:    30 years        BP:           156/99 mmHg Patient Gender: M               HR:           96 bpm. Exam Location:  Inpatient Procedure: 2D Echo, Cardiac Doppler and Color Doppler Indications:    Cardiac arrest  History:        Patient has prior history of Echocardiogram examinations, most                 recent 09/20/2019. COPD; Risk Factors:Hypertension.  Sonographer:    Cammy Brochure Referring Phys: [phone] [facility] F DAVIS  Sonographer Comments: Image acquisition challenging due to uncooperative patient and Image acquisition challenging due to respiratory motion. IMPRESSIONS  1. Left ventricular ejection fraction, by estimation, is 60 to 65%. The left ventricle has normal  function. The left ventricle has no regional wall motion abnormalities. There is mild left ventricular hypertrophy. Left ventricular diastolic parameters are consistent with Grade I diastolic dysfunction (impaired relaxation).  2. Right ventricular systolic function is normal. The right ventricular size is normal.  3. Left atrial size was mild to moderately dilated.  4. The mitral valve is normal in structure. Trivial mitral valve regurgitation. No evidence of mitral stenosis.  5. The aortic valve was not well visualized. There is mild calcification of the aortic valve. Aortic valve regurgitation is not visualized. Mild aortic valve sclerosis is present, with no evidence of aortic valve stenosis.  6. The inferior vena cava is normal in size with greater than 50% respiratory variability, suggesting right atrial pressure of 3 mmHg. FINDINGS  Left Ventricle: Left ventricular ejection fraction, by estimation, is 60 to 65%. The left ventricle has normal function. The left ventricle has no regional wall motion abnormalities. The  left ventricular internal cavity size was normal in size. There is  mild left ventricular hypertrophy. Left ventricular diastolic parameters are consistent with Grade I diastolic dysfunction (impaired relaxation). Right Ventricle: The right ventricular size is normal. No increase in right ventricular wall thickness. Right ventricular systolic function is normal. Left Atrium: Left atrial size was mild to moderately dilated. Right Atrium: Right atrial size was normal in size. Pericardium: There is no evidence of pericardial effusion. Mitral Valve: The mitral valve is normal in structure. Trivial mitral valve regurgitation. No evidence of mitral valve stenosis. Tricuspid Valve: The tricuspid valve is normal in structure. Tricuspid valve regurgitation is mild . No evidence of tricuspid stenosis. Aortic Valve: The aortic valve was not well visualized. There is mild calcification of the aortic valve.  Aortic valve regurgitation is not visualized. Mild aortic valve sclerosis is present, with no evidence of aortic valve stenosis. Aortic valve mean gradient measures 3.0 mmHg. Aortic valve peak gradient measures 4.9 mmHg. Aortic valve area, by VTI measures 3.14 cm. Pulmonic Valve: The pulmonic valve was not well visualized. Pulmonic valve regurgitation is not visualized. No evidence of pulmonic stenosis. Aorta: The aortic root is normal in size and structure. Venous: The inferior vena cava is normal in size with greater than 50% respiratory variability, suggesting right atrial pressure of 3 mmHg. IAS/Shunts: No atrial level shunt detected by color flow Doppler.  LEFT VENTRICLE PLAX 2D LVIDd:         3.30 cm  Diastology LVIDs:         2.20 cm  LV e' medial:    5.66 cm/s LV PW:         1.30 cm  LV E/e' medial:  8.7 LV IVS:        1.30 cm  LV e' lateral:   9.14 cm/s LVOT diam:     2.00 cm  LV E/e' lateral: 5.4 LV SV:         59 LV SV Index:   32 LVOT Area:     3.14 cm  RIGHT VENTRICLE RV S prime:     12.80 cm/s TAPSE (M-mode): 2.5 cm LEFT ATRIUM             Index LA diam:        2.50 cm 1.33 cm/m LA Vol (A2C):   50.8 ml 27.10 ml/m LA Vol (A4C):   49.0 ml 26.12 ml/m LA Biplane Vol: 49.6 ml 26.46 ml/m  AORTIC VALVE AV Area (Vmax):    2.69 cm AV Area (Vmean):   2.52 cm AV Area (VTI):     3.14 cm AV Vmax:           111.00 cm/s AV Vmean:          89.000 cm/s AV VTI:            0.188 m AV Peak Grad:      4.9 mmHg AV Mean Grad:      3.0 mmHg LVOT Vmax:         95.00 cm/s LVOT Vmean:        71.300 cm/s LVOT VTI:          0.188 m LVOT/AV VTI ratio: 1.00  AORTA Ao Root diam: 3.90 cm MITRAL VALVE               TRICUSPID VALVE MV Area (PHT): 5.02 cm    TR Peak grad:   21.0 mmHg MV Decel Time: 151 msec    TR Vmax:  229.00 cm/s MV E velocity: 49.20 cm/s MV A velocity: 70.60 cm/s  SHUNTS MV E/A ratio:  0.70        Systemic VTI:  0.19 m                            Systemic Diam: 2.00 cm Glori Bickers MD Electronically  signed by Glori Bickers MD Signature Date/Time: 01/07/2021/2:58:36 PM    Final

## 2021-01-09 DIAGNOSIS — R6510 Systemic inflammatory response syndrome (SIRS) of non-infectious origin without acute organ dysfunction: Secondary | ICD-10-CM | POA: Diagnosis not present

## 2021-01-09 LAB — CULTURE, BLOOD (ROUTINE X 2)
Culture: NO GROWTH
Culture: NO GROWTH
Special Requests: ADEQUATE

## 2021-01-09 LAB — PROCALCITONIN: Procalcitonin: <0.1 ng/mL

## 2021-01-09 LAB — COMPREHENSIVE METABOLIC PANEL
ALT: 9 U/L (ref 0–44)
AST: 15 U/L (ref 15–41)
Albumin: 2.6 g/dL — ABNORMAL LOW (ref 3.5–5.0)
Alkaline Phosphatase: 63 U/L (ref 38–126)
Anion gap: 7 (ref 5–15)
BUN: 11 mg/dL (ref 8–23)
CO2: 22 mmol/L (ref 22–32)
Calcium: 8.8 mg/dL — ABNORMAL LOW (ref 8.9–10.3)
Chloride: 103 mmol/L (ref 98–111)
Creatinine, Ser: 0.86 mg/dL (ref 0.61–1.24)
GFR, Estimated: >60 mL/min (ref >60)
Glucose, Bld: 96 mg/dL (ref 70–99)
Potassium: 3.9 mmol/L (ref 3.5–5.1)
Sodium: 132 mmol/L — ABNORMAL LOW (ref 135–145)
Total Bilirubin: 0.7 mg/dL (ref 0.3–1.2)
Total Protein: 5.9 g/dL — ABNORMAL LOW (ref 6.5–8.1)

## 2021-01-09 LAB — CBC WITH DIFFERENTIAL/PLATELET
Abs Immature Granulocytes: 0.08 10*3/uL — ABNORMAL HIGH (ref 0.00–0.07)
Basophils Absolute: 0 10*3/uL (ref 0.0–0.1)
Basophils Relative: 0 %
Eosinophils Absolute: 0.2 10*3/uL (ref 0.0–0.5)
Eosinophils Relative: 2 %
HCT: 23.6 % — ABNORMAL LOW (ref 39.0–52.0)
Hemoglobin: 8 g/dL — ABNORMAL LOW (ref 13.0–17.0)
Immature Granulocytes: 1 %
Lymphocytes Relative: 16 %
Lymphs Abs: 1.5 10*3/uL (ref 0.7–4.0)
MCH: 33.2 pg (ref 26.0–34.0)
MCHC: 33.9 g/dL (ref 30.0–36.0)
MCV: 97.9 fL (ref 80.0–100.0)
Monocytes Absolute: 2.3 10*3/uL — ABNORMAL HIGH (ref 0.1–1.0)
Monocytes Relative: 26 %
Neutro Abs: 4.9 10*3/uL (ref 1.7–7.7)
Neutrophils Relative %: 55 %
Platelets: 422 10*3/uL — ABNORMAL HIGH (ref 150–400)
RBC: 2.41 MIL/uL — ABNORMAL LOW (ref 4.22–5.81)
RDW: 16.4 % — ABNORMAL HIGH (ref 11.5–15.5)
WBC: 8.9 10*3/uL (ref 4.0–10.5)
nRBC: 0 % (ref 0.0–0.2)

## 2021-01-09 LAB — MAGNESIUM: Magnesium: 2.4 mg/dL (ref 1.7–2.4)

## 2021-01-09 LAB — C-REACTIVE PROTEIN: CRP: 15.5 mg/dL — ABNORMAL HIGH (ref <1.0)

## 2021-01-09 LAB — BRAIN NATRIURETIC PEPTIDE: B Natriuretic Peptide: 180.9 pg/mL — ABNORMAL HIGH (ref 0.0–100.0)

## 2021-01-09 MED ORDER — HALOPERIDOL LACTATE 5 MG/ML IJ SOLN
2.0000 mg | Freq: Four times a day (QID) | INTRAMUSCULAR | Status: DC | PRN
  Administered 2021-01-15: 2 mg via INTRAMUSCULAR
  Filled 2021-01-09: qty 1

## 2021-01-09 MED ORDER — LORAZEPAM 2 MG/ML IJ SOLN: 1.0000 mg | INTRAMUSCULAR | Status: DC | PRN

## 2021-01-09 MED ORDER — METOPROLOL TARTRATE 50 MG PO TABS
50.0000 mg | ORAL_TABLET | Freq: Two times a day (BID) | ORAL | Status: DC
  Administered 2021-01-09 – 2021-01-10 (×3): 50 mg via ORAL
  Filled 2021-01-09 (×3): qty 1

## 2021-01-09 MED ORDER — TRAZODONE HCL 50 MG PO TABS
25.0000 mg | ORAL_TABLET | Freq: Every evening | ORAL | Status: DC | PRN
  Administered 2021-01-09 – 2021-01-12 (×2): 25 mg via ORAL
  Filled 2021-01-09 (×2): qty 1

## 2021-01-09 NOTE — Plan of Care (Signed)
  Problem: Education: Goal: Knowledge of General Education information will improve Description: Including pain rating scale, medication(s)/side effects and non-pharmacologic comfort measures Outcome: Progressing   Problem: Clinical Measurements: Goal: Ability to maintain clinical measurements within normal limits will improve Outcome: Progressing Goal: Will remain free from infection Outcome: Progressing Goal: Diagnostic test results will improve Outcome: Progressing Goal: Respiratory complications will improve Outcome: Progressing Goal: Cardiovascular complication will be avoided Outcome: Progressing   Problem: Activity: Goal: Risk for activity intolerance will decrease Outcome: Progressing   Problem: Nutrition: Goal: Adequate nutrition will be maintained Outcome: Progressing   Problem: Coping: Goal: Level of anxiety will decrease Outcome: Progressing   Problem: Elimination: Goal: Will not experience complications related to bowel motility Outcome: Progressing Goal: Will not experience complications related to urinary retention Outcome: Progressing   Problem: Pain Managment: Goal: General experience of comfort will improve Outcome: Progressing   Problem: Safety: Goal: Ability to remain free from injury will improve Outcome: Progressing   Problem: Skin Integrity: Goal: Risk for impaired skin integrity will decrease Outcome: Progressing   Problem: Education: Goal: Knowledge of disease or condition will improve Outcome: Progressing Goal: Knowledge of the prescribed therapeutic regimen will improve Outcome: Progressing   Problem: Activity: Goal: Ability to tolerate increased activity will improve Outcome: Progressing Goal: Will verbalize the importance of balancing activity with adequate rest periods Outcome: Progressing   Problem: Respiratory: Goal: Ability to maintain a clear airway will improve Outcome: Progressing Goal: Levels of oxygenation will  improve Outcome: Progressing Goal: Ability to maintain adequate ventilation will improve Outcome: Progressing

## 2021-01-09 NOTE — Progress Notes (Signed)
PROGRESS NOTE                                                                                                                                                                                                             Patient Demographics:    Seth [name], is a 65 y.o. male, DOB - [birth year], [id number]  Outpatient Primary MD for the patient is Lucianne Lei, MD    LOS - 7  Admit date - 01/01/2021    Chief Complaint  Patient presents with  . Altered Mental Status       Brief Narrative (HPI from H&P)  - Seth [name] is a 65 y.o. male with medical history significant for history of CVA, PVD s/p LLE angiography with DES placed to popliteal artery, left foot osteomyelitis s/p transmetatarsal hypertension 09/22/2020, COPD, hypertension, alcohol use, and tobacco use who presents to the ED for evaluation of altered mental status, in the ER his work-up was consistent with dehydration, hypoglycemia and SDH.  His course was complicated by development of aspiration pneumonias & febrile seizures (SDH).  Putatively stayed in ICU for a day.   Subjective:   Patient in bed, appears comfortable, denies any headache, no fever, no chest pain or pressure, no shortness of breath , no abdominal pain. No new focal weakness.   Assessment  & Plan :     1. Acute mental status decline - likely on by alcohol abuse, poor oral intake causing hypoglycemia, hypomagnesemia, dehydration, AKI and hyperkalemia, with possible DTs. CT showing SDH.  He has been extensively counseled to quit alcohol intake, hydrate with IV fluids, DTs have resolved.  For SDH he was seen by neurosurgeon Dr. Saintclair Halsted, multiple scans are stable.  Plavix for now has been discontinued, low-dose aspirin upon discharge.  Post discharge he needs to follow-up with Dr. Phillip Heal again for a repeat CT scan.  2.  Asp. PNA due to DTs  - chest x-ray noted, currently on Augmentin continued, speech  therapy following on dysphagia 2 diet.  3.  History of CVA.  Supportive care continue ASA with statin.  4.  Seizures likely due to fever from aspiration pneumonia on 01/05/2021 in the setting of SDH.  Appreciate neurology input, EEG nonacute, on Keppra continue.  5.  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  Supportive care. Was on DAPT with statin.  Case discussed with his vascular surgeon Dr. Donzetta Matters on 04/2021, continue aspirin and statin for now.  Can hold Plavix indefinitely if needed for SDH.  6.  Alcohol abuse.  Counseled to quit last drink on 12/27/2020 ?,  He is slightly more confused on 01/03/2021 and could be going into early DTs, will monitor on CIWA protocol.  7.  COPD.  Stable on supportive care.  8.  Ongoing history of smoking.  Counseled to quit on Nico Derm patch.  9. Hypokalemia - replaced.  10. Asp. PNA -chest x-ray noted, currently on Augmentin continued, speech therapy following on dysphagia 2 diet.   11. Macrocytic anemia.  Borderline B12 & Iron levels, B12 & Iron replaced, given a unit of packed RBC on 01/06/2021. PPI, outpt GI follow up .  12.  Hyponatremia.  Appears dehydrated clinically, improved post IV fluids. Ur Osm << Ser Osm.  13.  Hypomagnesemia.  Replaced.  14. ? V.Tach in the CT scan department.  Questionable true event versus artifact, echo shows a preserved EF, on high-dose beta-blocker.  Monitor electrolytes.  Symptom-free.  15. HTN - Lopressor adjusted 01/09/21.       Condition - Extremely Guarded  Family Communication  : Sister [name] [id number] [name] 01/03/21, 01/06/21  Code Status :  Full  Consults  :  Nuerosurgery Dr Saintclair Halsted, PCCM, Neuro  PUD Prophylaxis : PPI   Procedures  :     TTE - 1. Left ventricular ejection fraction, by estimation, is 60 to 65%. The left ventricle has normal function. The left ventricle has no regional wall motion  abnormalities. There is mild left ventricular hypertrophy.  Left ventricular diastolic parameters are consistent with Grade I diastolic dysfunction (impaired relaxation).  2. Right ventricular systolic function is normal. The right ventricular size is normal.  3. Left atrial size was mild to moderately dilated.  4. The mitral valve is normal in structure. Trivial mitral valve regurgitation. No evidence of mitral stenosis.  5. The aortic valve was not well visualized. There is mild calcification of the aortic valve. Aortic valve regurgitation is not visualized. Mild aortic valve sclerosis is present, with no evidence of aortic valve stenosis.  6. The inferior vena cava is normal in size with greater than 50% respiratory variability, suggesting right atrial pressure of 3 mmHg.  Prolonged EEG - Non acute  CT Head - Non Acute x 4      Disposition Plan  :    Status is:   Dispo: The patient is from: Home              Anticipated d/c is to: Home              Patient currently is not medically stable to d/c.   Difficult to place patient No  DVT Prophylaxis  :    Place and maintain sequential compression device Start: 01/03/21 1148 Place TED hose Start: 01/03/21 0658    Lab Results  Component Value Date   PLT 422 (H) 01/09/2021    Diet :  Diet Order            DIET DYS 2 Room service appropriate? Yes; Fluid consistency: Thin  Diet effective now                  Inpatient Medications  Scheduled Meds: . sodium chloride   Intravenous Once  . amoxicillin-clavulanate  1 tablet Oral  Q12H  . aspirin  81 mg Oral Daily  . Chlorhexidine Gluconate Cloth  6 each Topical Daily  . feeding supplement  1 Container Oral TID BM  . ferrous sulfate  325 mg Oral BID WC  . finasteride  5 mg Oral Daily  . fluticasone furoate-vilanterol  1 puff Inhalation Daily  . folic acid  1 mg Oral Daily  . levETIRAcetam  500 mg Oral BID   Or  . levETIRAcetam  500 mg Oral BID  . metoprolol tartrate   100 mg Oral BID  . multivitamin with minerals  1 tablet Oral Daily  . nicotine  21 mg Transdermal Daily  . rosuvastatin  10 mg Oral Daily  . sodium chloride flush  10-40 mL Intracatheter Q12H  . thiamine  100 mg Oral Daily  . vitamin B-12  1,000 mcg Oral Daily   Continuous Infusions:  PRN Meds:.acetaminophen, acetaminophen **OR** [DISCONTINUED] acetaminophen, albuterol, cloNIDine, dextrose, guaiFENesin, LORazepam, [DISCONTINUED] ondansetron **OR** ondansetron (ZOFRAN) IV  Antibiotics  :    Anti-infectives (From admission, onward)   Start     Dose/Rate Route Frequency Ordered Stop   01/07/21 2030  amoxicillin-clavulanate (AUGMENTIN) 875-125 MG per tablet 1 tablet        1 tablet Oral Every 12 hours 01/07/21 1944     01/05/21 1800  Ampicillin-Sulbactam (UNASYN) 3 g in sodium chloride 0.9 % 100 mL IVPB  Status:  Discontinued        3 g 200 mL/hr over 30 Minutes Intravenous Every 6 hours 01/05/21 1657 01/07/21 1034   01/01/21 2330  vancomycin (VANCOREADY) IVPB 1250 mg/250 mL        1,250 mg 166.7 mL/hr over 90 Minutes Intravenous  Once 01/01/21 2302 01/02/21 0301   01/01/21 2315  piperacillin-tazobactam (ZOSYN) IVPB 3.375 g        3.375 g 12.5 mL/hr over 240 Minutes Intravenous  Once 01/01/21 2302 01/02/21 0404       Time Spent in minutes  30   [name] M.D on 01/09/2021 at 8:59 AM  To page go to [email or web address]   [facility] -  Office  [phone]    See all Orders from today for further details    Objective:   Vitals:   01/08/21 2345 01/09/21 0336 01/09/21 0558 01/09/21 0744  BP: 115/81 135/78  112/72  Pulse: 89 90  92  Resp: (!) 21 16 16 19   Temp: 99.3 F (37.4 C) 98.9 F (37.2 C)  99.9 F (37.7 C)  TempSrc: Oral Oral  Axillary  SpO2: 100% 100%  100%  Weight:   65.2 kg   Height:        Wt Readings from Last 3 Encounters:  01/09/21 65.2 kg  10/04/20 67.1 kg  09/27/20 67.3 kg     Intake/Output Summary (Last 24 hours) at 01/09/2021 0859 Last  data filed at 01/09/2021 0747 Gross per 24 hour  Intake 1004.88 ml  Output 975 ml  Net 29.88 ml     Physical Exam  Awake Alert, No new F.N deficits, Normal affect [place].AT,PERRAL Supple Neck,No JVD, No cervical lymphadenopathy appriciated.  Symmetrical Chest wall movement, Good air movement bilaterally, CTAB RRR,No Gallops, Rubs or new Murmurs, No Parasternal Heave +ve B.Sounds, Abd Soft, No tenderness, No organomegaly appriciated, No rebound - guarding or rigidity. 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    Data Review:    CBC Recent Labs  Lab 01/05/21 0010 01/06/21 0142 01/06/21 1518 01/07/21 0059 01/08/21 0718 01/09/21 0038  WBC 6.4 6.4  6.9 9.0 9.0 8.9  HGB 7.6* 6.9* 8.9* 8.8* 9.2* 8.0*  HCT 23.2* 20.3* 26.4* 26.1* 27.0* 23.6*  PLT 119* 143* 213 236 361 422*  MCV 103.1* 100.0 100.0 98.9 98.5 97.9  MCH 33.8 34.0 33.7 33.3 33.6 33.2  MCHC 32.8 34.0 33.7 33.7 34.1 33.9  RDW 17.3* 16.9* 17.1* 17.2* 16.7* 16.4*  LYMPHSABS 1.5 1.5  --  1.1 1.2 1.5  MONOABS 1.5* 1.6*  --  2.3* 2.5* 2.3*  EOSABS 0.1 0.1  --  0.1 0.1 0.2  BASOSABS 0.0 0.0  --  0.0 0.0 0.0    Recent Labs  Lab 01/04/21 2205 01/05/21 0010 01/05/21 0758 01/06/21 0142 01/06/21 1518 01/07/21 0059 01/08/21 0718 01/09/21 0038  NA  --  134*  --  131* 133* 133* 128* 132*  K  --  3.6  --  3.7 3.9 4.4 3.7 3.9  CL  --  103  --  100 101 103 96* 103  CO2  --  26  --  23 24 23  21* 22  GLUCOSE  --  97  --  93 96 88 90 96  BUN  --  7*  --  7* 7* 5* 5* 11  CREATININE  --  0.78  --  0.79 0.76 0.88 0.89 0.86  CALCIUM  --  8.7*  --  8.6* 9.1 8.7* 8.8* 8.8*  AST  --  25  --  19  --  18 17 15   ALT  --  12  --  12  --  12 10 9   ALKPHOS  --  70  --  60  --  66 64 63  BILITOT  --  0.8  --  0.7  --  0.9 1.2 0.7  ALBUMIN  --  2.8*  --  2.6*  --  2.8* 3.0* 2.6*  MG  --  2.0  --  1.7  --  2.1 1.6* 2.4  CRP  --   --   --   --   --  11.6* 16.4* 15.5*  PROCALCITON <0.10  --  <0.10 <0.10  --  <0.10 <0.10 <0.10   LATICACIDVEN 3.0* 1.2  --   --   --   --   --   --   BNP  --  162.8*  --  308.1*  --  150.3* 263.9* 180.9*    ------------------------------------------------------------------------------------------------------------------ No results for input(s): CHOL, HDL, LDLCALC, TRIG, CHOLHDL, LDLDIRECT in the last 72 hours.  Lab Results  Component Value Date   HGBA1C 4.2 (L) 07/20/2020   ------------------------------------------------------------------------------------------------------------------ No results for input(s): TSH, T4TOTAL, T3FREE, THYROIDAB in the last 72 hours.  Invalid input(s): FREET3  Cardiac Enzymes No results for input(s): CKMB, TROPONINI, MYOGLOBIN in the last 168 hours.  Invalid input(s): CK ------------------------------------------------------------------------------------------------------------------    Component Value Date/Time   BNP 180.9 (H) 01/09/2021 0038    Micro Results Recent Results (from the past 240 hour(s))  Culture, blood (Routine x 2)     Status: None   Collection Time: 01/01/21 10:04 PM   Specimen: BLOOD RIGHT HAND  Result Value Ref Range Status   Specimen Description BLOOD RIGHT HAND  Final   Special Requests   Final    BOTTLES DRAWN AEROBIC ONLY Blood Culture results may not be optimal due to an inadequate volume of blood received in culture bottles   Culture   Final    NO GROWTH 5 DAYS Performed at [facility], [place] [address], [place], [place] [address]  Report Status 01/06/2021 FINAL  Final  Resp Panel by RT-PCR (Flu A&B, Covid) Nasopharyngeal Swab     Status: None   Collection Time: 01/01/21 10:09 PM   Specimen: Nasopharyngeal Swab; Nasopharyngeal(NP) swabs in vial transport medium  Result Value Ref Range Status   SARS Coronavirus 2 by RT PCR NEGATIVE NEGATIVE Final    Comment: (NOTE) SARS-CoV-2 target nucleic acids are NOT DETECTED.  The SARS-CoV-2 RNA is generally detectable in upper respiratory specimens during the  acute phase of infection. The lowest concentration of SARS-CoV-2 viral copies this assay can detect is 138 copies/mL. A negative result does not preclude SARS-Cov-2 infection and should not be used as the sole basis for treatment or other patient management decisions. A negative result may occur with  improper specimen collection/handling, submission of specimen other than nasopharyngeal swab, presence of viral mutation(s) within the areas targeted by this assay, and inadequate number of viral copies(<138 copies/mL). A negative result must be combined with clinical observations, patient history, and epidemiological information. The expected result is Negative.  Fact Sheet for Patients:  EntrepreneurPulse.com.au  Fact Sheet for Healthcare Providers:  IncredibleEmployment.be  This test is no t yet approved or cleared by the [place] FDA and  has been authorized for detection and/or diagnosis of SARS-CoV-2 by FDA under an Emergency Use Authorization (EUA). This EUA will remain  in effect (meaning this test can be used) for the duration of the COVID-19 declaration under Section 564(b)(1) of the Act, 21 U.S.C.section 360bbb-3(b)(1), unless the authorization is terminated  or revoked sooner.       Influenza A by PCR NEGATIVE NEGATIVE Final   Influenza B by PCR NEGATIVE NEGATIVE Final    Comment: (NOTE) The Xpert Xpress SARS-CoV-2/FLU/RSV plus assay is intended as an aid in the diagnosis of influenza from Nasopharyngeal swab specimens and should not be used as a sole basis for treatment. Nasal washings and aspirates are unacceptable for Xpert Xpress SARS-CoV-2/FLU/RSV testing.  Fact Sheet for Patients: EntrepreneurPulse.com.au  Fact Sheet for Healthcare Providers: IncredibleEmployment.be  This test is not yet approved or cleared by the [place] FDA and has been authorized for detection and/or  diagnosis of SARS-CoV-2 by FDA under an Emergency Use Authorization (EUA). This EUA will remain in effect (meaning this test can be used) for the duration of the COVID-19 declaration under Section 564(b)(1) of the Act, 21 U.S.C. section 360bbb-3(b)(1), unless the authorization is terminated or revoked.  Performed at [facility], [place] [address], [place], [place] [address]   Culture, blood (Routine x 2)     Status: None   Collection Time: 01/01/21 11:34 PM   Specimen: BLOOD LEFT ARM  Result Value Ref Range Status   Specimen Description BLOOD LEFT ARM  Final   Special Requests   Final    BOTTLES DRAWN AEROBIC AND ANAEROBIC Blood Culture adequate volume   Culture   Final    NO GROWTH 5 DAYS Performed at [facility], [place] [address] [place], [place] [address]    Report Status 01/07/2021 FINAL  Final  Culture, blood (routine x 2)     Status: None (Preliminary result)   Collection Time: 01/04/21 10:00 PM   Specimen: BLOOD RIGHT HAND  Result Value Ref Range Status   Specimen Description BLOOD RIGHT HAND  Final   Special Requests   Final    BOTTLES DRAWN AEROBIC AND ANAEROBIC Blood Culture adequate volume   Culture   Final    NO GROWTH 4 DAYS  Performed at [facility], [place] [address], [place], [place] [address]    Report Status PENDING  Incomplete  Culture, blood (routine x 2)     Status: None (Preliminary result)   Collection Time: 01/04/21 10:09 PM   Specimen: BLOOD RIGHT HAND  Result Value Ref Range Status   Specimen Description BLOOD RIGHT HAND  Final   Special Requests   Final    AEROBIC BOTTLE ONLY Blood Culture results may not be optimal due to an inadequate volume of blood received in culture bottles   Culture   Final    NO GROWTH 4 DAYS Performed at [facility], [place] [address] [place], [place] [address]    Report Status PENDING  Incomplete    Radiology Reports  DG Chest 2 View  Result Date: 01/01/2021 CLINICAL DATA:  Altered mental status  and possible sepsis EXAM: CHEST - 2 VIEW COMPARISON:  09/19/2020 FINDINGS: The heart size and mediastinal contours are within normal limits. Both lungs are clear. The visualized skeletal structures are unremarkable. IMPRESSION: No active cardiopulmonary disease. Electronically Signed   By: Inez Catalina M.D.   On: 01/01/2021 21:44   CT HEAD WO CONTRAST  Result Date: 01/06/2021 CLINICAL DATA:  Followup subdural hematoma EXAM: CT HEAD WITHOUT CONTRAST TECHNIQUE: Contiguous axial images were obtained from the base of the skull through the vertex without intravenous contrast. COMPARISON:  01/05/2021 FINDINGS: Brain: Left lateral convexity subdural hematoma is stable, maximal thickness 10 mm measured on the coronal imaging. No additional bleeding or mass-effect. Left-to-right shift of 4 mm. Tiny amount of subdural blood along the falx is unchanged. No new area of bleeding. No acute ischemic infarction. Old small vessel infarctions of the cerebellum and chronic small-vessel ischemic changes of the cerebral hemispheric white matter. Vascular: No acute vascular finding. Skull: Negative Sinuses/Orbits: Clear/normal Other: None IMPRESSION: No change since yesterday. Left convexity subdural is no larger. Mass-effect is the same. Small amount of blood along the falx is the same. Electronically Signed   By: Nelson Chimes M.D.   On: 01/06/2021 13:54   CT HEAD WO CONTRAST  Result Date: 01/05/2021 CLINICAL DATA:  Follow-up subdural hematoma. EXAM: CT HEAD WITHOUT CONTRAST TECHNIQUE: Contiguous axial images were obtained from the base of the skull through the vertex without intravenous contrast. COMPARISON:  Earlier today, additional priors. FINDINGS: Brain: Acute left subdural hematoma is essentially unchanged in size from earlier today, maximal dimension 12 mm in the parietal region, series 3, image 11, 11 mm earlier today, and 14 mm yesterday. There is trace subdural blood along the superior falx, unchanged from earlier  today. Minimal mass effect on the underlying high convexity. Left-to-right midline shift of 5 mm, previously 6 mm. No intraparenchymal or subarachnoid hemorrhage. No evidence of developing ischemia. Remote bilateral cerebellar infarcts. Vascular: Atherosclerosis of skullbase vasculature without hyperdense vessel or abnormal calcification. Skull: No fracture or focal lesion. Sinuses/Orbits: No acute findings. Other: None. IMPRESSION: 1. Acute left subdural hematoma is essentially unchanged in size from earlier today, maximal dimension 12 mm in the parietal region. Left-to-right midline shift of 5 mm, previously 6 mm. 2. Small amount of subdural blood tracks along the superior falx, unchanged. Electronically Signed   By: Keith Rake M.D.   On: 01/05/2021 18:30   CT HEAD WO CONTRAST  Result Date: 01/05/2021 CLINICAL DATA:  Recent subdural hematoma EXAM: CT HEAD WITHOUT CONTRAST TECHNIQUE: Contiguous axial images were obtained from the base of the skull through the vertex without intravenous contrast.  COMPARISON:  Jan 04, 2021 FINDINGS: Brain: The previously noted acute subdural hematoma along the left frontal and temporal lobes extending superiorly to the level of the left convexity is again noted with a maximum thickness of 1.1 cm in the left convexity region, marginally less thick than on the previous study. Subdural size and contour elsewhere appears essentially stable. There is stable mass effect on portions of the left frontal and temporal lobes with 6 mm of midline shift to the right, stable. No new or enlarging subdural hematoma evident. The slight amount of subdural hematoma in the anterior falx is stable without mass effect. There is no evident intra-axial mass or hemorrhage. Mild diffuse atrophy with mild underlying periventricular small vessel disease noted. Prior cerebellar infarcts are stable, larger on the right than on the left. Note that fourth ventricle is midline. No acute infarct evident.  Vascular: No evident hyperdense vessel. Calcification noted in each carotid siphon region. Skull: Bony calvarium appears intact. Sinuses/Orbits: Stable opacification in several ethmoid air cells and superior right maxillary antrum, stable. Orbits appear symmetric bilaterally. Other: Mastoid air cells clear IMPRESSION: Left-sided subdural hematoma again noted, minimally smaller in the region of the convexity and stable elsewhere. Degree of mass effect stable with 6 mm of midline shift toward the right at the level of the foramen of Monro. Fourth ventricle midline. Stable atrophy with periventricular small vessel disease. Prior cerebellar infarcts appear stable. No acute infarct. No intra-axial mass or hemorrhage. There are foci of arterial vascular calcification. There are foci of paranasal sinus disease. Electronically Signed   By: Lowella Grip III M.D.   On: 01/05/2021 12:29   CT HEAD WO CONTRAST  Result Date: 01/04/2021 CLINICAL DATA:  Subdural hematoma EXAM: CT HEAD WITHOUT CONTRAST TECHNIQUE: Contiguous axial images were obtained from the base of the skull through the vertex without intravenous contrast. COMPARISON:  Jan 03, 2021 FINDINGS: Brain: There is an acute appearing subdural hematoma again noted along the left frontal and temporal lobes extending superiorly to the level of the left convexity. The maximum thickness of this subdural hematoma in the left convexity region is 1.4 cm, stable by remeasurement. There is stable mass effect on portions of the left frontal, temporal, anterior parietal lobes. At the level of the foramen of Monro, there is 6 mm of midline shift toward the right, slightly increased compared to 1 day prior. There is subdural hematoma tracking along the falx with slightly more hemorrhage in this area compared to previous study. There is no appreciable mass effect along the falx, however. There is no intra-axial hemorrhage or mass. There is underlying mild diffuse atrophy with  mild periventricular small vessel disease. There are small prior infarcts in the cerebellum bilaterally. No acute infarct is appreciable. Vascular: No hyperdense vessel. There is calcification in each carotid siphon region. Skull: The bony calvarium appears intact. Sinuses/Orbits: There is opacification in several ethmoid air cells as well as in the anterior superior right maxillary antrum. Visualized orbits appear symmetric bilaterally. Other: Mastoid air cells are clear. IMPRESSION: Subdural hematoma on the left with essentially stable size and contour compared to the previous study. There remains mass effect on portions of the left frontal, left temporal, anterior left parietal lobes. There is marginally more midline shift to the right compared to 1 day prior. There is slightly more acute subdural hematoma in the falx without appreciable mass effect in the falcine region. There is no appreciable intra-axial hemorrhage. There is mild atrophy with mild periventricular small vessel disease.  Small chronic appearing infarcts in the cerebellum, stable. No evident acute infarct. Foci of arterial vascular calcification noted. Areas of paranasal sinus disease noted. Electronically Signed   By: Lowella Grip III M.D.   On: 01/04/2021 08:01   CT HEAD WO CONTRAST  Result Date: 01/03/2021 CLINICAL DATA:  Delirium EXAM: CT HEAD WITHOUT CONTRAST TECHNIQUE: Contiguous axial images were obtained from the base of the skull through the vertex without intravenous contrast. COMPARISON:  09/14/2020 FINDINGS: Brain: Hyperdense subdural hemorrhage is present along the left cerebral convexity measuring up to 9 mm thickness. Small volume hyperdense subdural hemorrhage is also present along the falx. Mass effect is mild with 4 mm of rightward midline shift at the foramen of Monro. 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 Gray-white differentiation is preserved. Prominence of the ventricles and sulci  reflects generalized parenchymal volume loss. Chronic bilateral cerebellar infarcts. Patchy hypoattenuation in the supratentorial white matter is nonspecific but probably reflects stable chronic microvascular ischemic changes. Vascular: There is intracranial atherosclerotic calcification at the skull base Skull: Unremarkable. Sinuses/Orbits: Patchy paranasal sinus mucosal thickening. No acute orbital abnormality. Other: Mastoid air cells are clear. IMPRESSION: Acute left cerebral convexity subdural hemorrhage. Minimal additional subdural hemorrhage along the falx. Mass effect is mild with minor rightward midline shift. Stable chronic findings detailed above. These results were called by telephone at the time of interpretation on 01/03/2021 at 11:40 am to provider [facility] , who verbally acknowledged these results. Electronically Signed   By: Macy Mis M.D.   On: 01/03/2021 11:44   DG Chest Port 1 View  Result Date: 01/08/2021 CLINICAL DATA:  Shob/ Altered mental status Evaluate edema. pleural effusion. EXAM: PORTABLE CHEST - 1 VIEW COMPARISON:  01/06/2021 FINDINGS: Interstitial opacities at the right lung base are more conspicuous than on prior study. Left lung remains clear. Heart size and mediastinal contours are within normal limits. No effusion.  No pneumothorax. Cervical fixation hardware partially visualized. IMPRESSION: 1. Developing infiltrate or atelectasis at the right lung base. Electronically Signed   By: Lucrezia Europe M.D.   On: 01/08/2021 10:49   DG Chest Port 1 View  Result Date: 01/06/2021 CLINICAL DATA:  Shortness of breath, altered mental status EXAM: PORTABLE CHEST 1 VIEW COMPARISON:  01/05/2021 FINDINGS: Mild elevation of the right hemidiaphragm. No consolidation or edema. No significant pleural effusion. Stable blunting of the right costophrenic angle. Stable cardiomediastinal contours. IMPRESSION: No acute process in the chest. Electronically Signed   By: Macy Mis M.D.   On:  01/06/2021 08:25   DG Chest Port 1 View  Result Date: 01/05/2021 CLINICAL DATA:  Shortness of breath EXAM: PORTABLE CHEST 1 VIEW COMPARISON:  01/04/2021 FINDINGS: Heart and mediastinal contours are within normal limits. No focal opacities or effusions. No acute bony abnormality. IMPRESSION: No active disease. Electronically Signed   By: Rolm Baptise M.D.   On: 01/05/2021 08:28   DG CHEST PORT 1 VIEW  Result Date: 01/04/2021 CLINICAL DATA:  Cough and fevers EXAM: PORTABLE CHEST 1 VIEW COMPARISON:  01/01/2021, 09/19/2020 FINDINGS: Cardiac shadow is within normal limits. The lungs are well aerated bilaterally. Chronic blunting of the right costophrenic angle is noted stable from prior exams. No focal infiltrate is seen. No bony abnormality is noted. IMPRESSION: No active disease. Electronically Signed   By: Inez Catalina M.D.   On: 01/04/2021 22:35   EEG adult  Result Date: 01/06/2021 Lora Havens, MD     01/06/2021  3:09 PM Patient  Name: Seth [name] MRN: [id number] Epilepsy Attending: Lora Havens Referring Physician/Provider: Dr [name] Date: 01/06/2021 Duration: 23.31 mins Patient history: 65 year old male with alcohol use who presented with altered mental status and was found to have left subdural hematoma which did not require any neurosurgical intervention. Had generalized tonic-clonic seizure-like episode this afternoon. EEG to evaluate for seizure Level of alertness: lethargic AEDs during EEG study: Ativan Technical aspects: This EEG study was done with scalp electrodes positioned according to the 10-20 International system of electrode placement. Electrical activity was acquired at a sampling rate of 500Hz  and reviewed with a high frequency filter of 70Hz  and a low frequency filter of 1Hz . EEG data were recorded continuously and digitally stored. Description: EEG showed continuous generalized low amplitude predominantly 2-3 Hz admixed with intermittent 5- 6 Hz theta slowing.    Hyperventilation and photic stimulation were not performed.   ABNORMALITY - Continuous slow, generalized IMPRESSION: This study is suggestive of moderate to severe diffuse encephalopathy, nonspecific etiology. No seizures or epileptiform discharges were seen throughout the recording. Priyanka [name]   Overnight EEG with video  Result Date: 01/07/2021 Lora Havens, MD     01/07/2021  1:04 PM Patient Name: Seth [name] MRN: [id number] Epilepsy Attending: Lora Havens Referring Physician/Provider: Dr Zeb Comfort Duration: 01/06/2021 1447 to 01/07/2021 1136  Patient history: 65 year old male with alcohol use who presented with altered mental status and was found to have left subdural hematoma which did not require any neurosurgical intervention. Had generalized tonic-clonic seizure-like episode this afternoon. EEG to evaluate for seizure  Level of alertness: lethargic  AEDs during EEG study: Ativan, LEV  Technical aspects: This EEG study was done with scalp electrodes positioned according to the 10-20 International system of electrode placement. Electrical activity was acquired at a sampling rate of 500Hz  and reviewed with a high frequency filter of 70Hz  and a low frequency filter of 1Hz . EEG data were recorded continuously and digitally stored.  Description: The posterior dominant rhythm consists of 8 Hz activity of moderate voltage (25-35 uV) seen predominantly in posterior head regions, symmetric and reactive to eye opening and eye closing. Sleep was characterized by vertex waves, sleep spindles (12 to 14 Hz), maximal frontocentral region. EEG initially showed continuous generalized low amplitude predominantly 2-3 Hz admixed with intermittent 5- 6 Hz theta slowing which gradually improved to intermittent 3-5hz  theta-delta slowing. Sharp transients were seen in left fronto-centro-temporal region. Hyperventilation and photic stimulation were not performed.    ABNORMALITY - Continuous slow,  generalized - Intermittent slow, generalized  IMPRESSION: This study was initially suggestive of moderate diffuse encephalopathy which gradually improved to mild diffuse encephalopathy, nonspecific etiology but likely related to postictal state. No seizures or definite epileptiform discharges were seen throughout the recording.  Lora Havens   ECHOCARDIOGRAM COMPLETE  Result Date: 01/07/2021    ECHOCARDIOGRAM REPORT   Patient Name:   Seth [name] Date of Exam: 01/07/2021 Medical Rec #:  [id number]       Height:       75.0 in Accession #:    [id number]      Weight:       138.0 lb Date of Birth:  17-Feb-1956        BSA:          1.874 m Patient Age:    31 years        BP:           156/99 mmHg Patient Gender: M  HR:           96 bpm. Exam Location:  Inpatient Procedure: 2D Echo, Cardiac Doppler and Color Doppler Indications:    Cardiac arrest  History:        Patient has prior history of Echocardiogram examinations, most                 recent 09/20/2019. COPD; Risk Factors:Hypertension.  Sonographer:    Cammy Brochure Referring Phys: [phone] [facility] DAVIS  Sonographer Comments: Image acquisition challenging due to uncooperative patient and Image acquisition challenging due to respiratory motion. IMPRESSIONS  1. Left ventricular ejection fraction, by estimation, is 60 to 65%. The left ventricle has normal function. The left ventricle has no regional wall motion abnormalities. There is mild left ventricular hypertrophy. Left ventricular diastolic parameters are consistent with Grade I diastolic dysfunction (impaired relaxation).  2. Right ventricular systolic function is normal. The right ventricular size is normal.  3. Left atrial size was mild to moderately dilated.  4. The mitral valve is normal in structure. Trivial mitral valve regurgitation. No evidence of mitral stenosis.  5. The aortic valve was not well visualized. There is mild calcification of the aortic valve. Aortic valve  regurgitation is not visualized. Mild aortic valve sclerosis is present, with no evidence of aortic valve stenosis.  6. The inferior vena cava is normal in size with greater than 50% respiratory variability, suggesting right atrial pressure of 3 mmHg. FINDINGS  Left Ventricle: Left ventricular ejection fraction, by estimation, is 60 to 65%. The left ventricle has normal function. The left ventricle has no regional wall motion abnormalities. The left ventricular internal cavity size was normal in size. There is  mild left ventricular hypertrophy. Left ventricular diastolic parameters are consistent with Grade I diastolic dysfunction (impaired relaxation). Right Ventricle: The right ventricular size is normal. No increase in right ventricular wall thickness. Right ventricular systolic function is normal. Left Atrium: Left atrial size was mild to moderately dilated. Right Atrium: Right atrial size was normal in size. Pericardium: There is no evidence of pericardial effusion. Mitral Valve: The mitral valve is normal in structure. Trivial mitral valve regurgitation. No evidence of mitral valve stenosis. Tricuspid Valve: The tricuspid valve is normal in structure. Tricuspid valve regurgitation is mild . No evidence of tricuspid stenosis. Aortic Valve: The aortic valve was not well visualized. There is mild calcification of the aortic valve. Aortic valve regurgitation is not visualized. Mild aortic valve sclerosis is present, with no evidence of aortic valve stenosis. Aortic valve mean gradient measures 3.0 mmHg. Aortic valve peak gradient measures 4.9 mmHg. Aortic valve area, by VTI measures 3.14 cm. Pulmonic Valve: The pulmonic valve was not well visualized. Pulmonic valve regurgitation is not visualized. No evidence of pulmonic stenosis. Aorta: The aortic root is normal in size and structure. Venous: The inferior vena cava is normal in size with greater than 50% respiratory variability, suggesting right atrial pressure  of 3 mmHg. IAS/Shunts: No atrial level shunt detected by color flow Doppler.  LEFT VENTRICLE PLAX 2D LVIDd:         3.30 cm  Diastology LVIDs:         2.20 cm  LV e' medial:    5.66 cm/s LV PW:         1.30 cm  LV E/e' medial:  8.7 LV IVS:        1.30 cm  LV e' lateral:   9.14 cm/s LVOT diam:     2.00 cm  LV E/e' lateral: 5.4 LV SV:         59 LV SV Index:   32 LVOT Area:     3.14 cm  RIGHT VENTRICLE RV S prime:     12.80 cm/s TAPSE (M-mode): 2.5 cm LEFT ATRIUM             Index LA diam:        2.50 cm 1.33 cm/m LA Vol (A2C):   50.8 ml 27.10 ml/m LA Vol (A4C):   49.0 ml 26.12 ml/m LA Biplane Vol: 49.6 ml 26.46 ml/m  AORTIC VALVE AV Area (Vmax):    2.69 cm AV Area (Vmean):   2.52 cm AV Area (VTI):     3.14 cm AV Vmax:           111.00 cm/s AV Vmean:          89.000 cm/s AV VTI:            0.188 m AV Peak Grad:      4.9 mmHg AV Mean Grad:      3.0 mmHg LVOT Vmax:         95.00 cm/s LVOT Vmean:        71.300 cm/s LVOT VTI:          0.188 m LVOT/AV VTI ratio: 1.00  AORTA Ao Root diam: 3.90 cm MITRAL VALVE               TRICUSPID VALVE MV Area (PHT): 5.02 cm    TR Peak grad:   21.0 mmHg MV Decel Time: 151 msec    TR Vmax:        229.00 cm/s MV E velocity: 49.20 cm/s MV A velocity: 70.60 cm/s  SHUNTS MV E/A ratio:  0.70        Systemic VTI:  0.19 m                            Systemic Diam: 2.00 cm Glori Bickers MD Electronically signed by Glori Bickers MD Signature Date/Time: 01/07/2021/2:58:36 PM    Final

## 2021-01-10 ENCOUNTER — Inpatient Hospital Stay (HOSPITAL_COMMUNITY): Payer: Medicare Other

## 2021-01-10 DIAGNOSIS — R6510 Systemic inflammatory response syndrome (SIRS) of non-infectious origin without acute organ dysfunction: Secondary | ICD-10-CM | POA: Diagnosis not present

## 2021-01-10 LAB — C-REACTIVE PROTEIN: CRP: 13 mg/dL — ABNORMAL HIGH (ref <1.0)

## 2021-01-10 LAB — CBC WITH DIFFERENTIAL/PLATELET
Abs Immature Granulocytes: 0.07 10*3/uL (ref 0.00–0.07)
Basophils Absolute: 0 10*3/uL (ref 0.0–0.1)
Basophils Relative: 0 %
Eosinophils Absolute: 0.2 10*3/uL (ref 0.0–0.5)
Eosinophils Relative: 1 %
HCT: 23.7 % — ABNORMAL LOW (ref 39.0–52.0)
Hemoglobin: 7.9 g/dL — ABNORMAL LOW (ref 13.0–17.0)
Immature Granulocytes: 1 %
Lymphocytes Relative: 13 %
Lymphs Abs: 1.6 10*3/uL (ref 0.7–4.0)
MCH: 33.1 pg (ref 26.0–34.0)
MCHC: 33.3 g/dL (ref 30.0–36.0)
MCV: 99.2 fL (ref 80.0–100.0)
Monocytes Absolute: 1.9 10*3/uL — ABNORMAL HIGH (ref 0.1–1.0)
Monocytes Relative: 16 %
Neutro Abs: 8.4 10*3/uL — ABNORMAL HIGH (ref 1.7–7.7)
Neutrophils Relative %: 69 %
Platelets: 543 10*3/uL — ABNORMAL HIGH (ref 150–400)
RBC: 2.39 MIL/uL — ABNORMAL LOW (ref 4.22–5.81)
RDW: 16.4 % — ABNORMAL HIGH (ref 11.5–15.5)
WBC: 12.1 10*3/uL — ABNORMAL HIGH (ref 4.0–10.5)
nRBC: 0 % (ref 0.0–0.2)

## 2021-01-10 LAB — COMPREHENSIVE METABOLIC PANEL
ALT: 10 U/L (ref 0–44)
AST: 16 U/L (ref 15–41)
Albumin: 2.6 g/dL — ABNORMAL LOW (ref 3.5–5.0)
Alkaline Phosphatase: 55 U/L (ref 38–126)
Anion gap: 8 (ref 5–15)
BUN: 9 mg/dL (ref 8–23)
CO2: 20 mmol/L — ABNORMAL LOW (ref 22–32)
Calcium: 8.9 mg/dL (ref 8.9–10.3)
Chloride: 103 mmol/L (ref 98–111)
Creatinine, Ser: 0.79 mg/dL (ref 0.61–1.24)
GFR, Estimated: >60 mL/min (ref >60)
Glucose, Bld: 99 mg/dL (ref 70–99)
Potassium: 4.1 mmol/L (ref 3.5–5.1)
Sodium: 131 mmol/L — ABNORMAL LOW (ref 135–145)
Total Bilirubin: 0.7 mg/dL (ref 0.3–1.2)
Total Protein: 5.8 g/dL — ABNORMAL LOW (ref 6.5–8.1)

## 2021-01-10 LAB — BRAIN NATRIURETIC PEPTIDE: B Natriuretic Peptide: 216.2 pg/mL — ABNORMAL HIGH (ref 0.0–100.0)

## 2021-01-10 LAB — MAGNESIUM: Magnesium: 1.8 mg/dL (ref 1.7–2.4)

## 2021-01-10 LAB — PROCALCITONIN: Procalcitonin: <0.1 ng/mL

## 2021-01-10 IMAGING — DX DG CHEST 1V PORT
1 series · 1 of 1 positions shown · non-contrast
Comparison: Radiograph [DATE], chest CT [DATE]

CLINICAL DATA: Shortness of breath

EXAM:
PORTABLE CHEST 1 VIEW

[chest ap]
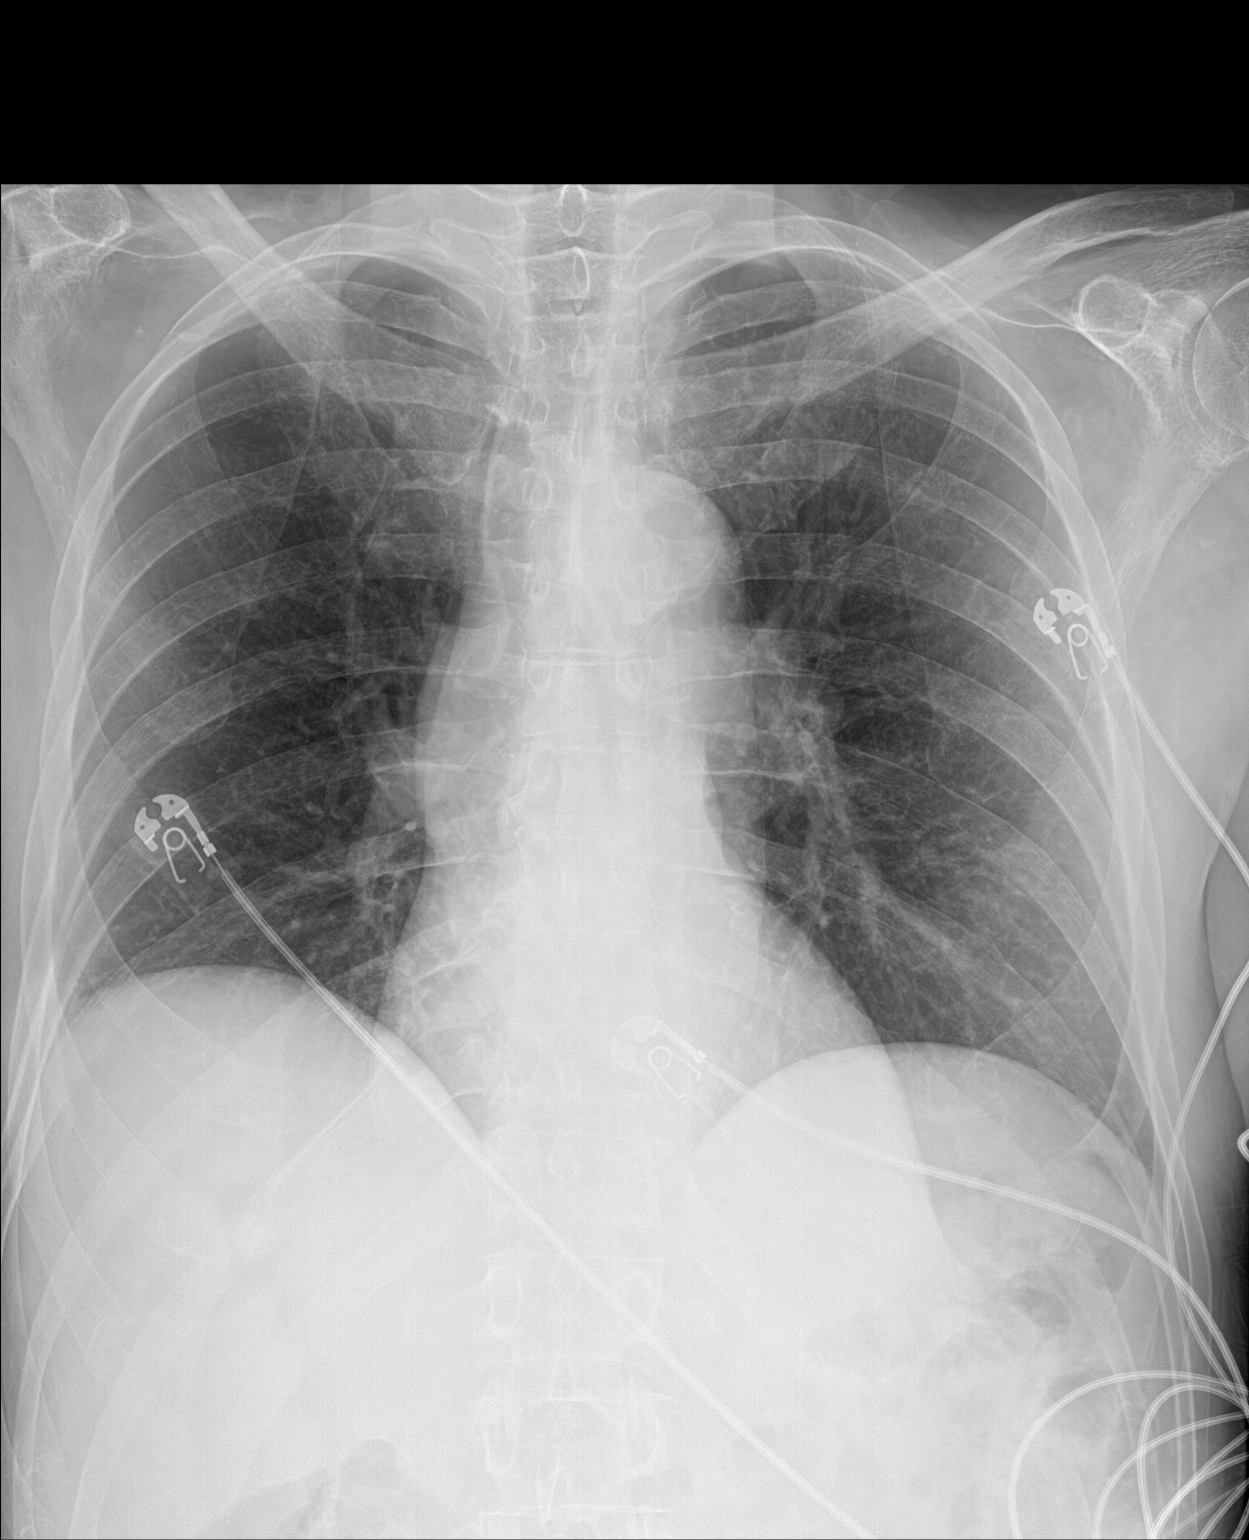

[1 of 1 positions shown; findings below may reference images not displayed]

FINDINGS: Unchanged cardiomediastinal silhouette. Decreased opacities in the
right lung base. There is no new focal airspace disease. There is no
pleural effusion or pneumothorax. Osseous structures are unchanged.
Partially visualized cervical spine fusion hardware.
IMPRESSION: Decreased opacities in the right lung base, likely improved
atelectasis. No new focal airspace disease.

## 2021-01-10 MED ORDER — BOOST PLUS PO LIQD
237.0000 mL | Freq: Three times a day (TID) | ORAL | Status: DC
  Administered 2021-01-10 – 2021-01-13 (×8): 237 mL via ORAL
  Filled 2021-01-10 (×13): qty 237

## 2021-01-10 MED ORDER — QUETIAPINE FUMARATE 25 MG PO TABS
25.0000 mg | ORAL_TABLET | Freq: Every day | ORAL | Status: DC
  Administered 2021-01-10 – 2021-01-12 (×3): 25 mg via ORAL
  Filled 2021-01-10 (×3): qty 1

## 2021-01-10 NOTE — Progress Notes (Signed)
PROGRESS NOTE                                                                                                                                                                                                             Patient Demographics:    Seth [name], is a 65 y.o. male, DOB - May 07, 1956, GHW:[id number]  Outpatient Primary MD for the patient is Lucianne Lei, MD    LOS - 8  Admit date - 01/01/2021    Chief Complaint  Patient presents with  . Altered Mental Status       Brief Narrative (HPI from H&P)  - Seth [name] is a 65 y.o. male with medical history significant for history of CVA, PVD s/p LLE angiography with DES placed to popliteal artery, left foot osteomyelitis s/p transmetatarsal hypertension 09/22/2020, COPD, hypertension, alcohol use, and tobacco use who presents to the ED for evaluation of altered mental status, in the ER his work-up was consistent with dehydration, hypoglycemia and SDH.  His course was complicated by development of aspiration pneumonias & febrile seizures (SDH).  Putatively stayed in ICU for a day.   Subjective:   Patient in bed, appears comfortable, denies any headache, no fever, no chest pain or pressure, no shortness of breath , no abdominal pain. No new focal weakness.   Assessment  & Plan :     1. Acute mental status decline - likely on by alcohol abuse, poor oral intake causing hypoglycemia, hypomagnesemia, dehydration, AKI and hyperkalemia, with possible DTs. CT showing SDH.  He has been extensively counseled to quit alcohol intake, hydrate with IV fluids, DTs have resolved.  For SDH he was seen by neurosurgeon Dr. Saintclair Halsted, multiple scans are stable.  Plavix for now has been discontinued, low-dose aspirin upon discharge.  Post discharge he needs to follow-up with Dr. Saintclair Halsted again for a repeat CT scan.  2.  Asp. PNA due to DTs  - chest x-ray noted, currently on Augmentin continued, speech  therapy following on dysphagia 2 diet.  3.  History of CVA.  Supportive care continue ASA with statin.  4.  Seizures likely due to fever from aspiration pneumonia on 01/05/2021 in the setting of SDH.  Appreciate neurology input, EEG nonacute, on Keppra continue.  5.  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  Supportive care. Was on DAPT with statin.  Case discussed with his vascular surgeon Dr. Donzetta Matters on 04/2021, continue aspirin and statin for now.  Can hold Plavix indefinitely if needed for SDH.  6.  Alcohol abuse.  Counseled to quit last drink on 12/27/2020 ?,  He is slightly more confused on 01/03/2021 and could be going into early DTs, finished CIWA protocol, PRN Haldol now.  7.  COPD.  Stable on supportive care.  8.  Ongoing history of smoking.  Counseled to quit on Nico Derm patch.  9. Hypokalemia - replaced.  10. Asp. PNA -chest x-ray noted, currently on Augmentin continued, speech therapy following on dysphagia 2 diet.   11. Macrocytic anemia.  Borderline B12 & Iron levels, B12 & Iron replaced, given a unit of packed RBC on 01/06/2021. PPI, outpt GI follow up .  12.  Hyponatremia.  Appears dehydrated clinically, improved post IV fluids. Ur Osm << Ser Osm.  13.  Hypomagnesemia.  Replaced.  14. ? V.Tach in the CT scan department.  Questionable true event versus artifact, echo shows a preserved EF, on high-dose beta-blocker.  Monitor electrolytes.  Symptom-free.  15. HTN - Lopressor adjusted 01/09/21.       Condition - Extremely Guarded  Family Communication  : Sister [name] [id number] [name] 01/03/21, 01/06/21, 01/10/21  Code Status :  Full  Consults  :  Nuerosurgery Dr Saintclair Halsted, PCCM, Neuro  PUD Prophylaxis : PPI   Procedures  :     TTE - 1. Left ventricular ejection fraction, by estimation, is 60 to 65%. The left ventricle has normal function. The left ventricle has no regional  wall motion abnormalities. There is mild left ventricular hypertrophy.  Left ventricular diastolic parameters are consistent with Grade I diastolic dysfunction (impaired relaxation).  2. Right ventricular systolic function is normal. The right ventricular size is normal.  3. Left atrial size was mild to moderately dilated.  4. The mitral valve is normal in structure. Trivial mitral valve regurgitation. No evidence of mitral stenosis.  5. The aortic valve was not well visualized. There is mild calcification of the aortic valve. Aortic valve regurgitation is not visualized. Mild aortic valve sclerosis is present, with no evidence of aortic valve stenosis.  6. The inferior vena cava is normal in size with greater than 50% respiratory variability, suggesting right atrial pressure of 3 mmHg.  Prolonged EEG - Non acute  CT Head - Non Acute x 4      Disposition Plan  :    Status is:   Dispo: The patient is from: Home              Anticipated d/c is to: Home              Patient currently is not medically stable to d/c.   Difficult to place patient No  DVT Prophylaxis  :    Place and maintain sequential compression device Start: 01/03/21 1148 Place TED hose Start: 01/03/21 0658    Lab Results  Component Value Date   PLT 543 (H) 01/10/2021    Diet :  Diet Order            DIET DYS 2 Room service appropriate? Yes; Fluid consistency: Thin  Diet effective now                  Inpatient Medications  Scheduled Meds: . sodium chloride   Intravenous Once  . amoxicillin-clavulanate  1  tablet Oral Q12H  . aspirin  81 mg Oral Daily  . Chlorhexidine Gluconate Cloth  6 each Topical Daily  . feeding supplement  1 Container Oral TID BM  . ferrous sulfate  325 mg Oral BID WC  . finasteride  5 mg Oral Daily  . fluticasone furoate-vilanterol  1 puff Inhalation Daily  . folic acid  1 mg Oral Daily  . levETIRAcetam  500 mg Oral BID   Or  . levETIRAcetam  500 mg Oral BID  .  metoprolol tartrate  50 mg Oral BID  . multivitamin with minerals  1 tablet Oral Daily  . nicotine  21 mg Transdermal Daily  . QUEtiapine  25 mg Oral QHS  . rosuvastatin  10 mg Oral Daily  . sodium chloride flush  10-40 mL Intracatheter Q12H  . thiamine  100 mg Oral Daily  . vitamin B-12  1,000 mcg Oral Daily   Continuous Infusions:  PRN Meds:.acetaminophen, acetaminophen **OR** [DISCONTINUED] acetaminophen, albuterol, cloNIDine, dextrose, guaiFENesin, haloperidol lactate, LORazepam, [DISCONTINUED] ondansetron **OR** ondansetron (ZOFRAN) IV, traZODone  Antibiotics  :    Anti-infectives (From admission, onward)   Start     Dose/Rate Route Frequency Ordered Stop   01/07/21 2030  amoxicillin-clavulanate (AUGMENTIN) 875-125 MG per tablet 1 tablet        1 tablet Oral Every 12 hours 01/07/21 1944     01/05/21 1800  Ampicillin-Sulbactam (UNASYN) 3 g in sodium chloride 0.9 % 100 mL IVPB  Status:  Discontinued        3 g 200 mL/hr over 30 Minutes Intravenous Every 6 hours 01/05/21 1657 01/07/21 1034   01/01/21 2330  vancomycin (VANCOREADY) IVPB 1250 mg/250 mL        1,250 mg 166.7 mL/hr over 90 Minutes Intravenous  Once 01/01/21 2302 01/02/21 0301   01/01/21 2315  piperacillin-tazobactam (ZOSYN) IVPB 3.375 g        3.375 g 12.5 mL/hr over 240 Minutes Intravenous  Once 01/01/21 2302 01/02/21 0404       Time Spent in minutes  30   [name] M.D on 01/10/2021 at 9:01 AM  To page go to [email or web address]   [facility] -  Office  [phone]    See all Orders from today for further details    Objective:   Vitals:   01/09/21 1639 01/09/21 2047 01/10/21 0400 01/10/21 0733  BP: (!) 159/84 120/66 118/61 138/73  Pulse: 98 100 100 91  Resp: 15 20 (!) 21 20  Temp: 97.6 F (36.4 C) 98 F (36.7 C) 98 F (36.7 C) 99.6 F (37.6 C)  TempSrc: Oral Oral Oral Oral  SpO2: 100% 98% 98% 97%  Weight:      Height:        Wt Readings from Last 3 Encounters:  01/09/21 65.2 kg   10/04/20 67.1 kg  09/27/20 67.3 kg     Intake/Output Summary (Last 24 hours) at 01/10/2021 0901 Last data filed at 01/10/2021 0734 Gross per 24 hour  Intake 840 ml  Output 1575 ml  Net -735 ml    Physical Exam  Awake Alert, No new F.N deficits, Normal affect [place].AT,PERRAL Supple Neck,No JVD, No cervical lymphadenopathy appriciated.  Symmetrical Chest wall movement, Good air movement bilaterally, CTAB RRR,No Gallops, Rubs or new Murmurs, No Parasternal Heave +ve B.Sounds, Abd Soft, No tenderness, No organomegaly appriciated, No rebound - guarding or rigidity. 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    Data Review:    CBC Recent Labs  Lab 01/06/21  [id number] 01/06/21 1518 01/07/21 0059 01/08/21 0718 01/09/21 0038 01/10/21 0138  WBC 6.4 6.9 9.0 9.0 8.9 12.1*  HGB 6.9* 8.9* 8.8* 9.2* 8.0* 7.9*  HCT 20.3* 26.4* 26.1* 27.0* 23.6* 23.7*  PLT 143* 213 236 361 422* 543*  MCV 100.0 100.0 98.9 98.5 97.9 99.2  MCH 34.0 33.7 33.3 33.6 33.2 33.1  MCHC 34.0 33.7 33.7 34.1 33.9 33.3  RDW 16.9* 17.1* 17.2* 16.7* 16.4* 16.4*  LYMPHSABS 1.5  --  1.1 1.2 1.5 1.6  MONOABS 1.6*  --  2.3* 2.5* 2.3* 1.9*  EOSABS 0.1  --  0.1 0.1 0.2 0.2  BASOSABS 0.0  --  0.0 0.0 0.0 0.0    Recent Labs  Lab 01/04/21 2205 01/05/21 0010 01/05/21 0758 01/06/21 0142 01/06/21 1518 01/07/21 0059 01/08/21 0718 01/09/21 0038 01/10/21 0138  NA  --  134*  --  131* 133* 133* 128* 132* 131*  K  --  3.6  --  3.7 3.9 4.4 3.7 3.9 4.1  CL  --  103  --  100 101 103 96* 103 103  CO2  --  26  --  23 24 23  21* 22 20*  GLUCOSE  --  97  --  93 96 88 90 96 99  BUN  --  7*  --  7* 7* 5* 5* 11 9  CREATININE  --  0.78  --  0.79 0.76 0.88 0.89 0.86 0.79  CALCIUM  --  8.7*  --  8.6* 9.1 8.7* 8.8* 8.8* 8.9  AST  --  25  --  19  --  18 17 15 16   ALT  --  12  --  12  --  12 10 9 10   ALKPHOS  --  70  --  60  --  66 64 63 55  BILITOT  --  0.8  --  0.7  --  0.9 1.2 0.7 0.7  ALBUMIN  --  2.8*  --  2.6*  --  2.8*  3.0* 2.6* 2.6*  MG  --  2.0  --  1.7  --  2.1 1.6* 2.4 1.8  CRP  --   --   --   --   --  11.6* 16.4* 15.5* 13.0*  PROCALCITON <0.10  --    < > <0.10  --  <0.10 <0.10 <0.10 <0.10  LATICACIDVEN 3.0* 1.2  --   --   --   --   --   --   --   BNP  --  162.8*  --  308.1*  --  150.3* 263.9* 180.9* 216.2*   < > = values in this interval not displayed.    ------------------------------------------------------------------------------------------------------------------ No results for input(s): CHOL, HDL, LDLCALC, TRIG, CHOLHDL, LDLDIRECT in the last 72 hours.  Lab Results  Component Value Date   HGBA1C 4.2 (L) 07/20/2020   ------------------------------------------------------------------------------------------------------------------ No results for input(s): TSH, T4TOTAL, T3FREE, THYROIDAB in the last 72 hours.  Invalid input(s): FREET3  Cardiac Enzymes No results for input(s): CKMB, TROPONINI, MYOGLOBIN in the last 168 hours.  Invalid input(s): CK ------------------------------------------------------------------------------------------------------------------    Component Value Date/Time   BNP 216.2 (H) 01/10/2021 0138    Micro Results Recent Results (from the past 240 hour(s))  Culture, blood (Routine x 2)     Status: None   Collection Time: 01/01/21 10:04 PM   Specimen: BLOOD RIGHT HAND  Result Value Ref Range Status   Specimen Description BLOOD RIGHT HAND  Final   Special Requests   Final  BOTTLES DRAWN AEROBIC ONLY Blood Culture results may not be optimal due to an inadequate volume of blood received in culture bottles   Culture   Final    NO GROWTH 5 DAYS Performed at [facility], [place] [address], [place], [place] [address]    Report Status 01/06/2021 FINAL  Final  Resp Panel by RT-PCR (Flu A&B, Covid) Nasopharyngeal Swab     Status: None   Collection Time: 01/01/21 10:09 PM   Specimen: Nasopharyngeal Swab; Nasopharyngeal(NP) swabs in vial transport medium   Result Value Ref Range Status   SARS Coronavirus 2 by RT PCR NEGATIVE NEGATIVE Final    Comment: (NOTE) SARS-CoV-2 target nucleic acids are NOT DETECTED.  The SARS-CoV-2 RNA is generally detectable in upper respiratory specimens during the acute phase of infection. The lowest concentration of SARS-CoV-2 viral copies this assay can detect is 138 copies/mL. A negative result does not preclude SARS-Cov-2 infection and should not be used as the sole basis for treatment or other patient management decisions. A negative result may occur with  improper specimen collection/handling, submission of specimen other than nasopharyngeal swab, presence of viral mutation(s) within the areas targeted by this assay, and inadequate number of viral copies(<138 copies/mL). A negative result must be combined with clinical observations, patient history, and epidemiological information. The expected result is Negative.  Fact Sheet for Patients:  EntrepreneurPulse.com.au  Fact Sheet for Healthcare Providers:  IncredibleEmployment.be  This test is no t yet approved or cleared by the [place] FDA and  has been authorized for detection and/or diagnosis of SARS-CoV-2 by FDA under an Emergency Use Authorization (EUA). This EUA will remain  in effect (meaning this test can be used) for the duration of the COVID-19 declaration under Section 564(b)(1) of the Act, 21 U.S.C.section 360bbb-3(b)(1), unless the authorization is terminated  or revoked sooner.       Influenza A by PCR NEGATIVE NEGATIVE Final   Influenza B by PCR NEGATIVE NEGATIVE Final    Comment: (NOTE) The Xpert Xpress SARS-CoV-2/FLU/RSV plus assay is intended as an aid in the diagnosis of influenza from Nasopharyngeal swab specimens and should not be used as a sole basis for treatment. Nasal washings and aspirates are unacceptable for Xpert Xpress SARS-CoV-2/FLU/RSV testing.  Fact Sheet for  Patients: EntrepreneurPulse.com.au  Fact Sheet for Healthcare Providers: IncredibleEmployment.be  This test is not yet approved or cleared by the [place] FDA and has been authorized for detection and/or diagnosis of SARS-CoV-2 by FDA under an Emergency Use Authorization (EUA). This EUA will remain in effect (meaning this test can be used) for the duration of the COVID-19 declaration under Section 564(b)(1) of the Act, 21 U.S.C. section 360bbb-3(b)(1), unless the authorization is terminated or revoked.  Performed at [facility], [place] [address] [place], [place] [address]   Culture, blood (Routine x 2)     Status: None   Collection Time: 01/01/21 11:34 PM   Specimen: BLOOD LEFT ARM  Result Value Ref Range Status   Specimen Description BLOOD LEFT ARM  Final   Special Requests   Final    BOTTLES DRAWN AEROBIC AND ANAEROBIC Blood Culture adequate volume   Culture   Final    NO GROWTH 5 DAYS Performed at [facility], [place] [address], [place], [place] [address]    Report Status 01/07/2021 FINAL  Final  Culture, blood (routine x 2)     Status: None   Collection Time: 01/04/21 10:00 PM   Specimen: BLOOD  RIGHT HAND  Result Value Ref Range Status   Specimen Description BLOOD RIGHT HAND  Final   Special Requests   Final    BOTTLES DRAWN AEROBIC AND ANAEROBIC Blood Culture adequate volume   Culture   Final    NO GROWTH 5 DAYS Performed at [facility], [name] [place], [place] [address]    Report Status 01/09/2021 FINAL  Final  Culture, blood (routine x 2)     Status: None   Collection Time: 01/04/21 10:09 PM   Specimen: BLOOD RIGHT HAND  Result Value Ref Range Status   Specimen Description BLOOD RIGHT HAND  Final   Special Requests   Final    AEROBIC BOTTLE ONLY Blood Culture results may not be optimal due to an inadequate volume of blood received in culture bottles   Culture   Final    NO GROWTH 5  DAYS Performed at [facility], [address] [place], [place] [address]    Report Status 01/09/2021 FINAL  Final    Radiology Reports  DG Chest 2 View  Result Date: 01/01/2021 CLINICAL DATA:  Altered mental status and possible sepsis EXAM: CHEST - 2 VIEW COMPARISON:  09/19/2020 FINDINGS: The heart size and mediastinal contours are within normal limits. Both lungs are clear. The visualized skeletal structures are unremarkable. IMPRESSION: No active cardiopulmonary disease. Electronically Signed   By: Alcide CleverMark  Lukens M.D.   On: 01/01/2021 21:44   CT HEAD WO CONTRAST  Result Date: 01/06/2021 CLINICAL DATA:  Followup subdural hematoma EXAM: CT HEAD WITHOUT CONTRAST TECHNIQUE: Contiguous axial images were obtained from the base of the skull through the vertex without intravenous contrast. COMPARISON:  01/05/2021 FINDINGS: Brain: Left lateral convexity subdural hematoma is stable, maximal thickness 10 mm measured on the coronal imaging. No additional bleeding or mass-effect. Left-to-right shift of 4 mm. Tiny amount of subdural blood along the falx is unchanged. No new area of bleeding. No acute ischemic infarction. Old small vessel infarctions of the cerebellum and chronic small-vessel ischemic changes of the cerebral hemispheric white matter. Vascular: No acute vascular finding. Skull: Negative Sinuses/Orbits: Clear/normal Other: None IMPRESSION: No change since yesterday. Left convexity subdural is no larger. Mass-effect is the same. Small amount of blood along the falx is the same. Electronically Signed   By: Paulina FusiMark  Shogry M.D.   On: 01/06/2021 13:54   CT HEAD WO CONTRAST  Result Date: 01/05/2021 CLINICAL DATA:  Follow-up subdural hematoma. EXAM: CT HEAD WITHOUT CONTRAST TECHNIQUE: Contiguous axial images were obtained from the base of the skull through the vertex without intravenous contrast. COMPARISON:  Earlier today, additional priors. FINDINGS: Brain: Acute left subdural hematoma is  essentially unchanged in size from earlier today, maximal dimension 12 mm in the parietal region, series 3, image 11, 11 mm earlier today, and 14 mm yesterday. There is trace subdural blood along the superior falx, unchanged from earlier today. Minimal mass effect on the underlying high convexity. Left-to-right midline shift of 5 mm, previously 6 mm. No intraparenchymal or subarachnoid hemorrhage. No evidence of developing ischemia. Remote bilateral cerebellar infarcts. Vascular: Atherosclerosis of skullbase vasculature without hyperdense vessel or abnormal calcification. Skull: No fracture or focal lesion. Sinuses/Orbits: No acute findings. Other: None. IMPRESSION: 1. Acute left subdural hematoma is essentially unchanged in size from earlier today, maximal dimension 12 mm in the parietal region. Left-to-right midline shift of 5 mm, previously 6 mm. 2. Small amount of subdural blood tracks along the superior falx, unchanged. Electronically Signed  By: Keith Rake M.D.   On: 01/05/2021 18:30   CT HEAD WO CONTRAST  Result Date: 01/05/2021 CLINICAL DATA:  Recent subdural hematoma EXAM: CT HEAD WITHOUT CONTRAST TECHNIQUE: Contiguous axial images were obtained from the base of the skull through the vertex without intravenous contrast. COMPARISON:  Jan 04, 2021 FINDINGS: Brain: The previously noted acute subdural hematoma along the left frontal and temporal lobes extending superiorly to the level of the left convexity is again noted with a maximum thickness of 1.1 cm in the left convexity region, marginally less thick than on the previous study. Subdural size and contour elsewhere appears essentially stable. There is stable mass effect on portions of the left frontal and temporal lobes with 6 mm of midline shift to the right, stable. No new or enlarging subdural hematoma evident. The slight amount of subdural hematoma in the anterior falx is stable without mass effect. There is no evident intra-axial mass or  hemorrhage. Mild diffuse atrophy with mild underlying periventricular small vessel disease noted. Prior cerebellar infarcts are stable, larger on the right than on the left. Note that fourth ventricle is midline. No acute infarct evident. Vascular: No evident hyperdense vessel. Calcification noted in each carotid siphon region. Skull: Bony calvarium appears intact. Sinuses/Orbits: Stable opacification in several ethmoid air cells and superior right maxillary antrum, stable. Orbits appear symmetric bilaterally. Other: Mastoid air cells clear IMPRESSION: Left-sided subdural hematoma again noted, minimally smaller in the region of the convexity and stable elsewhere. Degree of mass effect stable with 6 mm of midline shift toward the right at the level of the foramen of Monro. Fourth ventricle midline. Stable atrophy with periventricular small vessel disease. Prior cerebellar infarcts appear stable. No acute infarct. No intra-axial mass or hemorrhage. There are foci of arterial vascular calcification. There are foci of paranasal sinus disease. Electronically Signed   By: Lowella Grip III M.D.   On: 01/05/2021 12:29   CT HEAD WO CONTRAST  Result Date: 01/04/2021 CLINICAL DATA:  Subdural hematoma EXAM: CT HEAD WITHOUT CONTRAST TECHNIQUE: Contiguous axial images were obtained from the base of the skull through the vertex without intravenous contrast. COMPARISON:  Jan 03, 2021 FINDINGS: Brain: There is an acute appearing subdural hematoma again noted along the left frontal and temporal lobes extending superiorly to the level of the left convexity. The maximum thickness of this subdural hematoma in the left convexity region is 1.4 cm, stable by remeasurement. There is stable mass effect on portions of the left frontal, temporal, anterior parietal lobes. At the level of the foramen of Monro, there is 6 mm of midline shift toward the right, slightly increased compared to 1 day prior. There is subdural hematoma tracking  along the falx with slightly more hemorrhage in this area compared to previous study. There is no appreciable mass effect along the falx, however. There is no intra-axial hemorrhage or mass. There is underlying mild diffuse atrophy with mild periventricular small vessel disease. There are small prior infarcts in the cerebellum bilaterally. No acute infarct is appreciable. Vascular: No hyperdense vessel. There is calcification in each carotid siphon region. Skull: The bony calvarium appears intact. Sinuses/Orbits: There is opacification in several ethmoid air cells as well as in the anterior superior right maxillary antrum. Visualized orbits appear symmetric bilaterally. Other: Mastoid air cells are clear. IMPRESSION: Subdural hematoma on the left with essentially stable size and contour compared to the previous study. There remains mass effect on portions of the left frontal, left temporal, anterior left parietal  lobes. There is marginally more midline shift to the right compared to 1 day prior. There is slightly more acute subdural hematoma in the falx without appreciable mass effect in the falcine region. There is no appreciable intra-axial hemorrhage. There is mild atrophy with mild periventricular small vessel disease. Small chronic appearing infarcts in the cerebellum, stable. No evident acute infarct. Foci of arterial vascular calcification noted. Areas of paranasal sinus disease noted. Electronically Signed   By: Lowella Grip III M.D.   On: 01/04/2021 08:01   CT HEAD WO CONTRAST  Result Date: 01/03/2021 CLINICAL DATA:  Delirium EXAM: CT HEAD WITHOUT CONTRAST TECHNIQUE: Contiguous axial images were obtained from the base of the skull through the vertex without intravenous contrast. COMPARISON:  09/14/2020 FINDINGS: Brain: Hyperdense subdural hemorrhage is present along the left cerebral convexity measuring up to 9 mm thickness. Small volume hyperdense subdural hemorrhage is also present along the falx.  Mass effect is mild with 4 mm of rightward midline shift at the foramen of Monro. 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 Gray-white differentiation is preserved. Prominence of the ventricles and sulci reflects generalized parenchymal volume loss. Chronic bilateral cerebellar infarcts. Patchy hypoattenuation in the supratentorial white matter is nonspecific but probably reflects stable chronic microvascular ischemic changes. Vascular: There is intracranial atherosclerotic calcification at the skull base Skull: Unremarkable. Sinuses/Orbits: Patchy paranasal sinus mucosal thickening. No acute orbital abnormality. Other: Mastoid air cells are clear. IMPRESSION: Acute left cerebral convexity subdural hemorrhage. Minimal additional subdural hemorrhage along the falx. Mass effect is mild with minor rightward midline shift. Stable chronic findings detailed above. These results were called by telephone at the time of interpretation on 01/03/2021 at 11:40 am to provider [facility] , who verbally acknowledged these results. Electronically Signed   By: Macy Mis M.D.   On: 01/03/2021 11:44   DG Chest Port 1 View  Result Date: 01/10/2021 CLINICAL DATA:  Shortness of breath EXAM: PORTABLE CHEST 1 VIEW COMPARISON:  Radiograph 01/08/2021, chest CT 02/19/2019 FINDINGS: Unchanged cardiomediastinal silhouette. Decreased opacities in the right lung base. There is no new focal airspace disease. There is no pleural effusion or pneumothorax. Osseous structures are unchanged. Partially visualized cervical spine fusion hardware. IMPRESSION: Decreased opacities in the right lung base, likely improved atelectasis. No new focal airspace disease. Electronically Signed   By: Maurine Simmering   On: 01/10/2021 08:22   DG Chest Port 1 View  Result Date: 01/08/2021 CLINICAL DATA:  Shob/ Altered mental status Evaluate edema. pleural effusion. EXAM: PORTABLE CHEST - 1 VIEW COMPARISON:  01/06/2021 FINDINGS:  Interstitial opacities at the right lung base are more conspicuous than on prior study. Left lung remains clear. Heart size and mediastinal contours are within normal limits. No effusion.  No pneumothorax. Cervical fixation hardware partially visualized. IMPRESSION: 1. Developing infiltrate or atelectasis at the right lung base. Electronically Signed   By: Lucrezia Europe M.D.   On: 01/08/2021 10:49   DG Chest Port 1 View  Result Date: 01/06/2021 CLINICAL DATA:  Shortness of breath, altered mental status EXAM: PORTABLE CHEST 1 VIEW COMPARISON:  01/05/2021 FINDINGS: Mild elevation of the right hemidiaphragm. No consolidation or edema. No significant pleural effusion. Stable blunting of the right costophrenic angle. Stable cardiomediastinal contours. IMPRESSION: No acute process in the chest. Electronically Signed   By: Macy Mis M.D.   On: 01/06/2021 08:25   DG Chest Port 1 View  Result Date: 01/05/2021 CLINICAL DATA:  Shortness of breath EXAM: PORTABLE CHEST  1 VIEW COMPARISON:  01/04/2021 FINDINGS: Heart and mediastinal contours are within normal limits. No focal opacities or effusions. No acute bony abnormality. IMPRESSION: No active disease. Electronically Signed   By: Rolm Baptise M.D.   On: 01/05/2021 08:28   DG CHEST PORT 1 VIEW  Result Date: 01/04/2021 CLINICAL DATA:  Cough and fevers EXAM: PORTABLE CHEST 1 VIEW COMPARISON:  01/01/2021, 09/19/2020 FINDINGS: Cardiac shadow is within normal limits. The lungs are well aerated bilaterally. Chronic blunting of the right costophrenic angle is noted stable from prior exams. No focal infiltrate is seen. No bony abnormality is noted. IMPRESSION: No active disease. Electronically Signed   By: Inez Catalina M.D.   On: 01/04/2021 22:35   EEG adult  Result Date: 01/06/2021 Lora Havens, MD     01/06/2021  3:09 PM Patient Name: REUEL PITTER MRN: [id number] Epilepsy Attending: Lora Havens Referring Physician/Provider: Dr [name] Date:  01/06/2021 Duration: 23.31 mins Patient history: 65 year old male with alcohol use who presented with altered mental status and was found to have left subdural hematoma which did not require any neurosurgical intervention. Had generalized tonic-clonic seizure-like episode this afternoon. EEG to evaluate for seizure Level of alertness: lethargic AEDs during EEG study: Ativan Technical aspects: This EEG study was done with scalp electrodes positioned according to the 10-20 International system of electrode placement. Electrical activity was acquired at a sampling rate of 500Hz  and reviewed with a high frequency filter of 70Hz  and a low frequency filter of 1Hz . EEG data were recorded continuously and digitally stored. Description: EEG showed continuous generalized low amplitude predominantly 2-3 Hz admixed with intermittent 5- 6 Hz theta slowing.   Hyperventilation and photic stimulation were not performed.   ABNORMALITY - Continuous slow, generalized IMPRESSION: This study is suggestive of moderate to severe diffuse encephalopathy, nonspecific etiology. No seizures or epileptiform discharges were seen throughout the recording. Priyanka [name]   Overnight EEG with video  Result Date: 01/07/2021 Lora Havens, MD     01/07/2021  1:04 PM Patient Name: JOSIAN MORENO MRN: [id number] Epilepsy Attending: Lora Havens Referring Physician/Provider: Dr Zeb Comfort Duration: 01/06/2021 1447 to 01/07/2021 1136  Patient history: 66 year old male with alcohol use who presented with altered mental status and was found to have left subdural hematoma which did not require any neurosurgical intervention. Had generalized tonic-clonic seizure-like episode this afternoon. EEG to evaluate for seizure  Level of alertness: lethargic  AEDs during EEG study: Ativan, LEV  Technical aspects: This EEG study was done with scalp electrodes positioned according to the 10-20 International system of electrode placement. Electrical  activity was acquired at a sampling rate of 500Hz  and reviewed with a high frequency filter of 70Hz  and a low frequency filter of 1Hz . EEG data were recorded continuously and digitally stored.  Description: The posterior dominant rhythm consists of 8 Hz activity of moderate voltage (25-35 uV) seen predominantly in posterior head regions, symmetric and reactive to eye opening and eye closing. Sleep was characterized by vertex waves, sleep spindles (12 to 14 Hz), maximal frontocentral region. EEG initially showed continuous generalized low amplitude predominantly 2-3 Hz admixed with intermittent 5- 6 Hz theta slowing which gradually improved to intermittent 3-5hz  theta-delta slowing. Sharp transients were seen in left fronto-centro-temporal region. Hyperventilation and photic stimulation were not performed.    ABNORMALITY - Continuous slow, generalized - Intermittent slow, generalized  IMPRESSION: This study was initially suggestive of moderate diffuse encephalopathy which gradually improved to mild diffuse encephalopathy, nonspecific etiology  but likely related to postictal state. No seizures or definite epileptiform discharges were seen throughout the recording.  Lora Havens   ECHOCARDIOGRAM COMPLETE  Result Date: 01/07/2021    ECHOCARDIOGRAM REPORT   Patient Name:   JAYZON BLAKENSHIP Date of Exam: 01/07/2021 Medical Rec #:  [id number]       Height:       75.0 in Accession #:    [id number]      Weight:       138.0 lb Date of Birth:  [birth year]        BSA:          1.874 m Patient Age:    68 years        BP:           156/99 mmHg Patient Gender: M               HR:           96 bpm. Exam Location:  Inpatient Procedure: 2D Echo, Cardiac Doppler and Color Doppler Indications:    Cardiac arrest  History:        Patient has prior history of Echocardiogram examinations, most                 recent 09/20/2019. COPD; Risk Factors:Hypertension.  Sonographer:    Cammy Brochure Referring Phys: [phone] [facility]   Sonographer Comments: Image acquisition challenging due to uncooperative patient and Image acquisition challenging due to respiratory motion. IMPRESSIONS  1. Left ventricular ejection fraction, by estimation, is 60 to 65%. The left ventricle has normal function. The left ventricle has no regional wall motion abnormalities. There is mild left ventricular hypertrophy. Left ventricular diastolic parameters are consistent with Grade I diastolic dysfunction (impaired relaxation).  2. Right ventricular systolic function is normal. The right ventricular size is normal.  3. Left atrial size was mild to moderately dilated.  4. The mitral valve is normal in structure. Trivial mitral valve regurgitation. No evidence of mitral stenosis.  5. The aortic valve was not well visualized. There is mild calcification of the aortic valve. Aortic valve regurgitation is not visualized. Mild aortic valve sclerosis is present, with no evidence of aortic valve stenosis.  6. The inferior vena cava is normal in size with greater than 50% respiratory variability, suggesting right atrial pressure of 3 mmHg. FINDINGS  Left Ventricle: Left ventricular ejection fraction, by estimation, is 60 to 65%. The left ventricle has normal function. The left ventricle has no regional wall motion abnormalities. The left ventricular internal cavity size was normal in size. There is  mild left ventricular hypertrophy. Left ventricular diastolic parameters are consistent with Grade I diastolic dysfunction (impaired relaxation). Right Ventricle: The right ventricular size is normal. No increase in right ventricular wall thickness. Right ventricular systolic function is normal. Left Atrium: Left atrial size was mild to moderately dilated. Right Atrium: Right atrial size was normal in size. Pericardium: There is no evidence of pericardial effusion. Mitral Valve: The mitral valve is normal in structure. Trivial mitral valve regurgitation. No evidence of mitral valve  stenosis. Tricuspid Valve: The tricuspid valve is normal in structure. Tricuspid valve regurgitation is mild . No evidence of tricuspid stenosis. Aortic Valve: The aortic valve was not well visualized. There is mild calcification of the aortic valve. Aortic valve regurgitation is not visualized. Mild aortic valve sclerosis is present, with no evidence of aortic valve stenosis. Aortic valve mean gradient measures 3.0 mmHg. Aortic valve peak gradient measures  4.9 mmHg. Aortic valve area, by VTI measures 3.14 cm. Pulmonic Valve: The pulmonic valve was not well visualized. Pulmonic valve regurgitation is not visualized. No evidence of pulmonic stenosis. Aorta: The aortic root is normal in size and structure. Venous: The inferior vena cava is normal in size with greater than 50% respiratory variability, suggesting right atrial pressure of 3 mmHg. IAS/Shunts: No atrial level shunt detected by color flow Doppler.  LEFT VENTRICLE PLAX 2D LVIDd:         3.30 cm  Diastology LVIDs:         2.20 cm  LV e' medial:    5.66 cm/s LV PW:         1.30 cm  LV E/e' medial:  8.7 LV IVS:        1.30 cm  LV e' lateral:   9.14 cm/s LVOT diam:     2.00 cm  LV E/e' lateral: 5.4 LV SV:         59 LV SV Index:   32 LVOT Area:     3.14 cm  RIGHT VENTRICLE RV S prime:     12.80 cm/s TAPSE (M-mode): 2.5 cm LEFT ATRIUM             Index LA diam:        2.50 cm 1.33 cm/m LA Vol (A2C):   50.8 ml 27.10 ml/m LA Vol (A4C):   49.0 ml 26.12 ml/m LA Biplane Vol: 49.6 ml 26.46 ml/m  AORTIC VALVE AV Area (Vmax):    2.69 cm AV Area (Vmean):   2.52 cm AV Area (VTI):     3.14 cm AV Vmax:           111.00 cm/s AV Vmean:          89.000 cm/s AV VTI:            0.188 m AV Peak Grad:      4.9 mmHg AV Mean Grad:      3.0 mmHg LVOT Vmax:         95.00 cm/s LVOT Vmean:        71.300 cm/s LVOT VTI:          0.188 m LVOT/AV VTI ratio: 1.00  AORTA Ao Root diam: 3.90 cm MITRAL VALVE               TRICUSPID VALVE MV Area (PHT): 5.02 cm    TR Peak grad:   21.0  mmHg MV Decel Time: 151 msec    TR Vmax:        229.00 cm/s MV E velocity: 49.20 cm/s MV A velocity: 70.60 cm/s  SHUNTS MV E/A ratio:  0.70        Systemic VTI:  0.19 m                            Systemic Diam: 2.00 cm Glori Bickers MD Electronically signed by Glori Bickers MD Signature Date/Time: 01/07/2021/2:58:36 PM    Final

## 2021-01-10 NOTE — Progress Notes (Signed)
Occupational Therapy Treatment Patient Details Name: Seth [name] MRN: [id number] DOB: 02-Jan-1956 Today's Date: 01/10/2021    History of present illness Seth [name] is a 65 y.o. male who presents to ED with AMS, 1 fall, and vomiting. Patient admitted with SIRS, AKI, hypoglycemia and dehydration. CT  5/11 (+) acute L SDH.  5/12  he had acute change in mental status and seizure and right hemibody weakness.   EEG 5/12-513.  Right hemibody weakness resolved 5/13.  PMHx significant for HTN, ETOH & tobacco use disorder, COVID-19 ~3 mos ago, Hx of CVA, PVD, L transmet amp 09/22/2020, HTN, COPD   OT comments  R hemibody weakness has resolved since OT re-evaluation on 5/12. Patient making progress toward goals but continues to be limited by generalized weakness, decreased cognition, and need for Min to Mod A overall for self-care tasks. Treatment session limited secondary to hypotension. OT will continue to follow to maximize independence and safety with self-care tasks. Continue recommendation for CIR.   Follow Up Recommendations  CIR    Equipment Recommendations  3 in 1 bedside commode    Recommendations for Other Services      Precautions / Restrictions Precautions Precautions: Fall Precaution Comments: orthostatic Restrictions Weight Bearing Restrictions: No       Mobility Bed Mobility Overal bed mobility: Needs Assistance Bed Mobility: Supine to Sit;Sit to Supine     Supine to sit: Min guard;HOB elevated Sit to supine: Min guard   General bed mobility comments: Min guard for supine <> EOB. Patient completes task in reasonable amount of time.    Transfers                 General transfer comment: Deferred 2/2 hypotension.    Balance Overall balance assessment: Needs assistance;History of Falls Sitting-balance support: No upper extremity supported Sitting balance-[name] Scale: Fair                                     ADL either performed or  assessed with clinical judgement   ADL Overall ADL's : Needs assistance/impaired             Lower Body Bathing: Minimal assistance Lower Body Bathing Details (indicate cue type and reason): Min A for LB bathing seated EOB. Patient requires increased time/effort. Able to shift weight side to side to wash buttocks. Upper Body Dressing : Minimal assistance;Sitting Upper Body Dressing Details (indicate cue type and reason): Donned anterior hospital gown seated EOB.                         Vision   Vision Assessment?: No apparent visual deficits   Perception     Praxis      Cognition Arousal/Alertness: Awake/alert Behavior During Therapy: WFL for tasks assessed/performed Overall Cognitive Status: Impaired/Different from baseline Area of Impairment: Memory;Following commands;Awareness;Problem solving;Orientation                 Orientation Level: Disoriented to;Time   Memory: Decreased short-term memory Following Commands: Follows one step commands with increased time   Awareness: Intellectual Problem Solving: Slow processing;Requires verbal cues;Requires tactile cues;Decreased initiation General Comments: Patient able to follow 1-step verbal commands with increased accuracy and less time needed. Able to maintain casual conversation.        Exercises     Shoulder Instructions       General Comments Patient with BP  80's over 60's and symptomatic noting mild dizziness seated EOB. No change after several minutes of light ADLs in sitting. Session limited to EOB activity only.    Pertinent Vitals/ Pain       Pain Assessment: No/denies pain  Home Living                                          Prior Functioning/Environment              Frequency  Min 2X/week        Progress Toward Goals  OT Goals(current goals can now be found in the care plan section)  Progress towards OT goals: Progressing toward goals  Acute Rehab OT  Goals Patient Stated Goal: No goals stated. OT Goal Formulation: With patient Time For Goal Achievement: 01/20/21 Potential to Achieve Goals: Good ADL Goals Pt Will Perform Grooming: standing;with modified independence Pt Will Perform Upper Body Dressing: with modified independence;sitting Pt Will Perform Lower Body Dressing: with supervision;sit to/from stand Pt Will Transfer to Toilet: with supervision;ambulating Pt Will Perform Toileting - Clothing Manipulation and hygiene: with supervision;sit to/from stand Additional ADL Goal #1: Patient will recall 3 energy consrvation techniques in prep for completion of ADLs.  Plan Discharge plan remains appropriate;Frequency remains appropriate    Co-evaluation                 AM-PAC OT "6 Clicks" Daily Activity     Outcome Measure   Help from another person eating meals?: A Little Help from another person taking care of personal grooming?: A Little Help from another person toileting, which includes using toliet, bedpan, or urinal?: A Lot Help from another person bathing (including washing, rinsing, drying)?: A Lot Help from another person to put on and taking off regular upper body clothing?: A Little Help from another person to put on and taking off regular lower body clothing?: A Lot 6 Click Score: 15    End of Session    OT Visit Diagnosis: Unsteadiness on feet (R26.81);Other abnormalities of gait and mobility (R26.89);Muscle weakness (generalized) (M62.81)   Activity Tolerance Treatment limited secondary to medical complications (Comment) (Hypotension)   Patient Left in bed;with call bell/phone within reach;with bed alarm set;with nursing/sitter in room   Nurse Communication Mobility status        Time: 7322-0254 OT Time Calculation (min): 27 min  Charges: OT General Charges $OT Visit: 1 Visit OT Treatments $Self Care/Home Management : 8-22 mins  Malaka Ruffner H. OTR/L Supplemental OT, Department of rehab services  [phone]   [place]. 01/10/2021, 10:02 AM

## 2021-01-10 NOTE — Progress Notes (Signed)
Nutrition Follow-up  DOCUMENTATION CODES:  Underweight,Severe malnutrition in context of chronic illness  INTERVENTION:  Continue to advance diet per SLP.  Add Boost Plus po TID, each supplement provides 360 kcal and 14 grams of protein.  Continue Magic Cup and [employer] TID.  Continue CIWA.  NUTRITION DIAGNOSIS:  Severe Malnutrition related to chronic illness,poor appetite as evidenced by percent weight loss,moderate fat depletion,severe fat depletion,severe muscle depletion,per patient/family report. -ongoing  GOAL:  Patient will meet greater than or equal to 90% of their needs - meeting with PO and supplements  MONITOR:  PO intake,Supplement acceptance,Labs,Weight trends,I & O's  REASON FOR ASSESSMENT:  Malnutrition Screening Tool    ASSESSMENT:  Pt admitted with AMS, dehydration, hypoglycemia, and SIRS and later found to have SDH. PMH includes CVA, PAD s/p LLE angiography with DES placed to popliteal artery, L foot osteomyelitis s/p transmetatarsal amputation, HTN, COPD, EtOH and tobacco use. 5/7 - regular diet 5/11 - soft diet 5/12 - Dys 2 with thins  Spoke with pt briefly at bedside. Pt reports the food is okay and he is trying to eat well. He reports liking the supplements and RD explained that is important to drink them to help with weight gain. Per Epic, average intake for the last 7 meals is 71%.  Admit wt: 62.4 kg Current wt: 65.2 kg  On exam, pt has severe depletions. Pt is severely malnourished.  RD to recommend adding Boost Plus TID and continuing Magic Cup TID, Boost Breeze TID, and MVI with minerals daily.  Medications: reviewed; Boost Breeze TID, ferrous sulfate, Augmentin BID, folic acid, MVI with minerals, thiamine, Vitamin B12  Labs: reviewed; Na 131, CBG 88-213  NUTRITION - FOCUSED PHYSICAL EXAM: Flowsheet Row Most Recent Value  Orbital Region Moderate depletion  Upper Arm Region Mild depletion  Thoracic and Lumbar Region Severe depletion   Buccal Region Severe depletion  Temple Region Severe depletion  Clavicle Bone Region Severe depletion  Clavicle and Acromion Bone Region Severe depletion  Scapular Bone Region Severe depletion  Dorsal Hand Severe depletion  Patellar Region Moderate depletion  Anterior Thigh Region Moderate depletion  Posterior Calf Region Severe depletion  Edema (RD Assessment) None  Hair Reviewed  Eyes Reviewed  Mouth Reviewed  Skin Reviewed  Nails Reviewed     Diet Order:   Diet Order            DIET DYS 2 Room service appropriate? Yes; Fluid consistency: Thin  Diet effective now                EDUCATION NEEDS:  Education needs have been addressed  Skin:  Skin Assessment: Reviewed RN Assessment  Last BM:  01/09/21 - Type 6, large amount  Height:  Ht Readings from Last 1 Encounters:  01/02/21 6\' 3"  (1.905 m)   Weight:  Wt Readings from Last 1 Encounters:  01/09/21 65.2 kg   BMI:  Body mass index is 17.97 kg/m.  Estimated Nutritional Needs:  Kcal:  2000-2200 Protein:  100-115 grams Fluid:  >2L/d  Derrel Nip, RD, LDN Registered Dietitian After Hours/Weekend Pager # in [place]

## 2021-01-10 NOTE — Progress Notes (Signed)
Physical Therapy Treatment Patient Details Name: Seth [name] MRN: [id number] DOB: 10-Oct-1955 Today's Date: 01/10/2021    History of Present Illness Seth [name] is a 65 y.o. male who presents to ED with AMS, 1 fall, and vomiting. Patient admitted with SIRS, AKI, hypoglycemia and dehydration. CT  5/11 (+) acute L SDH.  5/12  he had acute change in mental status and seizure and right hemibody weakness.   EEG 5/12-513.  Right hemibody weakness resolved 5/13.  PMHx significant for HTN, ETOH & tobacco use disorder, COVID-19 ~3 mos ago, Hx of CVA, PVD, L transmet amp 09/22/2020, HTN, COPD    PT Comments    Patient received in bed, very pleasant and cooperative. Able to mobilize well today, however PT session was limited by large BP drop at EOB to 80s/60s with symptomatic dizziness. Pressures did not improve with activities at EOB, so we returned to supine for safety purposes. Left in bed with all needs met, BP 90s/60s with HOB at 30 degrees and alarm active. CIR has signed off, would still benefit from skilled care in the rehab setting, so would benefit from SNF at this time.     Follow Up Recommendations  SNF;Other (comment) (CIR has signed off, still needs rehab)     Equipment Recommendations  Rolling [place] with 5" wheels;3in1 (PT);Wheelchair cushion (measurements PT);Wheelchair (measurements PT)    Recommendations for Other Services       Precautions / Restrictions Precautions Precautions: Fall Precaution Comments: orthostatic Restrictions Weight Bearing Restrictions: No    Mobility  Bed Mobility Overal bed mobility: Needs Assistance Bed Mobility: Supine to Sit;Sit to Supine     Supine to sit: Min guard;HOB elevated Sit to supine: Min guard   General bed mobility comments: Min guard for supine <> EOB. Patient completes task in reasonable amount of time.    Transfers                 General transfer comment: Deferred 2/2 hypotension.  Ambulation/Gait              General Gait Details: deferred 2/2 hypotension   Stairs             Wheelchair Mobility    Modified Rankin (Stroke Patients Only)       Balance Overall balance assessment: Needs assistance;History of Falls Sitting-balance support: No upper extremity supported Sitting balance-[name] Scale: Fair Sitting balance - Comments: able to maintain midline sitting at EOB, close S for safety with activities       Standing balance comment: deferred, orthostatics                            Cognition Arousal/Alertness: Awake/alert Behavior During Therapy: WFL for tasks assessed/performed Overall Cognitive Status: Impaired/Different from baseline Area of Impairment: Memory;Following commands;Awareness;Problem solving;Orientation                 Orientation Level: Disoriented to;Time   Memory: Decreased short-term memory Following Commands: Follows one step commands with increased time   Awareness: Intellectual Problem Solving: Slow processing;Requires verbal cues;Requires tactile cues;Decreased initiation General Comments: Patient able to follow 1-step verbal commands with increased accuracy and less time needed. Able to maintain casual conversation.      Exercises      General Comments General comments (skin integrity, edema, etc.): BP drop to 80s/60s symptomatic sitting at EOB; did not improve with activity at EOB, so returned to supine and left HOB at 30 degrees  with BP at 90/60s      Pertinent Vitals/Pain Pain Assessment: No/denies pain Faces Pain Scale: No hurt    Home Living                      Prior Function            PT Goals (current goals can now be found in the care plan section) Acute Rehab PT Goals Patient Stated Goal: No goals stated. PT Goal Formulation: With patient Time For Goal Achievement: 01/16/21 Potential to Achieve Goals: Good Progress towards PT goals: Progressing toward goals    Frequency    Min  3X/week      PT Plan Equipment recommendations need to be updated;Discharge plan needs to be updated    Co-evaluation              AM-PAC PT "6 Clicks" Mobility   Outcome Measure  Help needed turning from your back to your side while in a flat bed without using bedrails?: None Help needed moving from lying on your back to sitting on the side of a flat bed without using bedrails?: A Little Help needed moving to and from a bed to a chair (including a wheelchair)?: A Lot Help needed standing up from a chair using your arms (e.g., wheelchair or bedside chair)?: A Lot Help needed to walk in hospital room?: Total Help needed climbing 3-5 steps with a railing? : Total 6 Click Score: 13    End of Session   Activity Tolerance: Treatment limited secondary to medical complications (Comment) (limited by orthostatics) Patient left: in bed;with bed alarm set;with call bell/phone within reach Nurse Communication: Mobility status PT Visit Diagnosis: Unsteadiness on feet (R26.81);Repeated falls (R29.6);Muscle weakness (generalized) (M62.81);Dizziness and giddiness (R42)     Time: 4193-7902 PT Time Calculation (min) (ACUTE ONLY): 28 min  Charges:  $Therapeutic Activity: 8-22 mins (co-tx with OT)                     Windell Norfolk, DPT, PN1   Supplemental Physical Therapist [place]    Pager [phone] Acute Rehab Office [phone]

## 2021-01-11 DIAGNOSIS — E43 Unspecified severe protein-calorie malnutrition: Secondary | ICD-10-CM | POA: Diagnosis present

## 2021-01-11 DIAGNOSIS — R6510 Systemic inflammatory response syndrome (SIRS) of non-infectious origin without acute organ dysfunction: Secondary | ICD-10-CM | POA: Diagnosis not present

## 2021-01-11 LAB — COMPREHENSIVE METABOLIC PANEL
ALT: 12 U/L (ref 0–44)
AST: 22 U/L (ref 15–41)
Albumin: 2.7 g/dL — ABNORMAL LOW (ref 3.5–5.0)
Alkaline Phosphatase: 55 U/L (ref 38–126)
Anion gap: 11 (ref 5–15)
BUN: 7 mg/dL — ABNORMAL LOW (ref 8–23)
CO2: 20 mmol/L — ABNORMAL LOW (ref 22–32)
Calcium: 9 mg/dL (ref 8.9–10.3)
Chloride: 98 mmol/L (ref 98–111)
Creatinine, Ser: 0.81 mg/dL (ref 0.61–1.24)
GFR, Estimated: >60 mL/min (ref >60)
Glucose, Bld: 74 mg/dL (ref 70–99)
Potassium: 4.6 mmol/L (ref 3.5–5.1)
Sodium: 129 mmol/L — ABNORMAL LOW (ref 135–145)
Total Bilirubin: 0.8 mg/dL (ref 0.3–1.2)
Total Protein: 6 g/dL — ABNORMAL LOW (ref 6.5–8.1)

## 2021-01-11 LAB — CBC WITH DIFFERENTIAL/PLATELET
Abs Immature Granulocytes: 0.13 10*3/uL — ABNORMAL HIGH (ref 0.00–0.07)
Basophils Absolute: 0.1 10*3/uL (ref 0.0–0.1)
Basophils Relative: 0 %
Eosinophils Absolute: 0.1 10*3/uL (ref 0.0–0.5)
Eosinophils Relative: 1 %
HCT: 24.8 % — ABNORMAL LOW (ref 39.0–52.0)
Hemoglobin: 8 g/dL — ABNORMAL LOW (ref 13.0–17.0)
Immature Granulocytes: 1 %
Lymphocytes Relative: 14 %
Lymphs Abs: 1.6 10*3/uL (ref 0.7–4.0)
MCH: 32.7 pg (ref 26.0–34.0)
MCHC: 32.3 g/dL (ref 30.0–36.0)
MCV: 101.2 fL — ABNORMAL HIGH (ref 80.0–100.0)
Monocytes Absolute: 1.6 10*3/uL — ABNORMAL HIGH (ref 0.1–1.0)
Monocytes Relative: 14 %
Neutro Abs: 8 10*3/uL — ABNORMAL HIGH (ref 1.7–7.7)
Neutrophils Relative %: 70 %
Platelets: 639 10*3/uL — ABNORMAL HIGH (ref 150–400)
RBC: 2.45 MIL/uL — ABNORMAL LOW (ref 4.22–5.81)
RDW: 16.7 % — ABNORMAL HIGH (ref 11.5–15.5)
WBC: 11.5 10*3/uL — ABNORMAL HIGH (ref 4.0–10.5)
nRBC: 0 % (ref 0.0–0.2)

## 2021-01-11 LAB — PROCALCITONIN: Procalcitonin: <0.1 ng/mL

## 2021-01-11 LAB — MAGNESIUM: Magnesium: 1.7 mg/dL (ref 1.7–2.4)

## 2021-01-11 LAB — BRAIN NATRIURETIC PEPTIDE: B Natriuretic Peptide: 170.7 pg/mL — ABNORMAL HIGH (ref 0.0–100.0)

## 2021-01-11 LAB — SARS CORONAVIRUS 2 (TAT 6-24 HRS): SARS Coronavirus 2: NEGATIVE

## 2021-01-11 MED ORDER — MAGNESIUM SULFATE IN D5W 1-5 GM/100ML-% IV SOLN
1.0000 g | Freq: Once | INTRAVENOUS | Status: AC
  Administered 2021-01-11: 1 g via INTRAVENOUS
  Filled 2021-01-11: qty 100

## 2021-01-11 MED ORDER — METOPROLOL TARTRATE 50 MG PO TABS
100.0000 mg | ORAL_TABLET | Freq: Two times a day (BID) | ORAL | Status: DC
  Administered 2021-01-11 – 2021-01-13 (×5): 100 mg via ORAL
  Filled 2021-01-11 (×5): qty 1

## 2021-01-11 MED ORDER — LACTATED RINGERS IV SOLN: INTRAVENOUS | Status: AC

## 2021-01-11 MED ORDER — FUROSEMIDE 40 MG PO TABS
40.0000 mg | ORAL_TABLET | Freq: Once | ORAL | Status: DC
  Filled 2021-01-11: qty 1

## 2021-01-11 NOTE — Progress Notes (Addendum)
PROGRESS NOTE                                                                                                                                                                                                             Patient Demographics:    Seth [name], is a 65 y.o. male, DOB - 1955/11/26, [id number]  Outpatient Primary MD for the patient is Lucianne Lei, MD    LOS - 9  Admit date - 01/01/2021    Chief Complaint  Patient presents with  . Altered Mental Status       Brief Narrative (HPI from H&P)  - Seth [name] is a 65 y.o. male with medical history significant for history of CVA, PVD s/p LLE angiography with DES placed to popliteal artery, left foot osteomyelitis s/p transmetatarsal hypertension 09/22/2020, COPD, hypertension, alcohol use, and tobacco use who presents to the ED for evaluation of altered mental status, in the ER his work-up was consistent with dehydration, hypoglycemia and SDH.  His course was complicated by development of aspiration pneumonias & febrile seizures (SDH).  Putatively stayed in ICU for a day.   Subjective:   Patient in bed, appears comfortable, denies any headache, no fever, no chest pain or pressure, no shortness of breath , no abdominal pain. No focal weakness.   Assessment  & Plan :     1. Acute mental status decline - likely on by alcohol abuse, poor oral intake causing hypoglycemia, hypomagnesemia, dehydration, AKI and hyperkalemia, with possible DTs. CT showing SDH.  He has been extensively counseled to quit alcohol intake, hydrate with IV fluids, DTs have resolved.  For SDH he was seen by neurosurgeon Dr. Saintclair Halsted, multiple scans are stable.  Plavix for now has been discontinued, low-dose aspirin upon discharge.  Post discharge he needs to follow-up with Dr. Saintclair Halsted again for a repeat CT scan.  2.  Asp. PNA due to DTs  - chest x-ray noted, currently on Augmentin continued, speech  therapy following on dysphagia 2 diet, stop Augmentin 01/13/21  3.  History of CVA.  Supportive care continue ASA with statin.  4.  Seizures likely due to fever from aspiration pneumonia on 01/05/2021 in the setting of SDH.  Appreciate neurology input, EEG nonacute, on Keppra continue.  5.  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  Supportive care. Was on DAPT with statin.  Case discussed with his vascular surgeon Dr. Donzetta Matters on 04/2021, continue aspirin and statin for now.  Can hold Plavix indefinitely if needed for SDH.  6.  Alcohol abuse.  Counseled to quit last drink on 12/27/2020 ?,  He is slightly more confused on 01/03/2021 and could be going into early DTs, finished CIWA protocol, PRN Haldol now.  7.  COPD.  Stable on supportive care.  8.  Ongoing history of smoking.  Counseled to quit on Nico Derm patch.  9. Hypokalemia - replaced.  10. Asp. PNA -chest x-ray noted, currently on Augmentin continued, speech therapy following on dysphagia 2 diet.   11. Macrocytic anemia.  Borderline B12 & Iron levels, B12 & Iron replaced, given a unit of packed RBC on 01/06/2021. PPI, outpt GI follow up .  12.  Hyponatremia.   Ur Osm << Ser Osm, would suggest dehydration, orthostatic, IVF - TEDs.  13.  Hypomagnesemia.  Replaced.  14. ? V.Tach in the CT scan department.  Questionable true event versus artifact, echo shows a preserved EF, on high-dose beta-blocker.  Monitor electrolytes.  Symptom-free.  15. HTN - Lopressor adjusted 01/09/21.       Condition - Extremely Guarded  Family Communication  : Sister [name] [id number] [name] 01/03/21, 01/06/21, 01/10/21  Code Status :  Full  Consults  :  Nuerosurgery Dr Saintclair Halsted, PCCM, Neuro  PUD Prophylaxis : PPI   Procedures  :     TTE - 1. Left ventricular ejection fraction, by estimation, is 60 to 65%. The left ventricle has normal function. The left  ventricle has no regional wall motion abnormalities. There is mild left ventricular hypertrophy.  Left ventricular diastolic parameters are consistent with Grade I diastolic dysfunction (impaired relaxation).  2. Right ventricular systolic function is normal. The right ventricular size is normal.  3. Left atrial size was mild to moderately dilated.  4. The mitral valve is normal in structure. Trivial mitral valve regurgitation. No evidence of mitral stenosis.  5. The aortic valve was not well visualized. There is mild calcification of the aortic valve. Aortic valve regurgitation is not visualized. Mild aortic valve sclerosis is present, with no evidence of aortic valve stenosis.  6. The inferior vena cava is normal in size with greater than 50% respiratory variability, suggesting right atrial pressure of 3 mmHg.  Prolonged EEG - Non acute  CT Head - Non Acute x 4      Disposition Plan  :    Status is:   Dispo: The patient is from: Home              Anticipated d/c is to: Home              Patient currently is not medically stable to d/c.   Difficult to place patient No  DVT Prophylaxis  :    Place and maintain sequential compression device Start: 01/03/21 1148 Place TED hose Start: 01/03/21 0658    Lab Results  Component Value Date   PLT 639 (H) 01/11/2021    Diet :  Diet Order            DIET DYS 2 Room service appropriate? Yes; Fluid consistency: Thin  Diet effective now                  Inpatient Medications  Scheduled Meds: . sodium chloride   Intravenous Once  .  amoxicillin-clavulanate  1 tablet Oral Q12H  . aspirin  81 mg Oral Daily  . Chlorhexidine Gluconate Cloth  6 each Topical Daily  . feeding supplement  1 Container Oral TID BM  . ferrous sulfate  325 mg Oral BID WC  . finasteride  5 mg Oral Daily  . fluticasone furoate-vilanterol  1 puff Inhalation Daily  . folic acid  1 mg Oral Daily  . lactose free nutrition  237 mL Oral TID WC  .  levETIRAcetam  500 mg Oral BID   Or  . levETIRAcetam  500 mg Oral BID  . metoprolol tartrate  100 mg Oral BID  . multivitamin with minerals  1 tablet Oral Daily  . nicotine  21 mg Transdermal Daily  . QUEtiapine  25 mg Oral QHS  . rosuvastatin  10 mg Oral Daily  . sodium chloride flush  10-40 mL Intracatheter Q12H  . thiamine  100 mg Oral Daily  . vitamin B-12  1,000 mcg Oral Daily   Continuous Infusions:  PRN Meds:.acetaminophen, acetaminophen **OR** [DISCONTINUED] acetaminophen, albuterol, cloNIDine, dextrose, guaiFENesin, haloperidol lactate, LORazepam, [DISCONTINUED] ondansetron **OR** ondansetron (ZOFRAN) IV, traZODone  Antibiotics  :    Anti-infectives (From admission, onward)   Start     Dose/Rate Route Frequency Ordered Stop   01/07/21 2030  amoxicillin-clavulanate (AUGMENTIN) 875-125 MG per tablet 1 tablet        1 tablet Oral Every 12 hours 01/07/21 1944     01/05/21 1800  Ampicillin-Sulbactam (UNASYN) 3 g in sodium chloride 0.9 % 100 mL IVPB  Status:  Discontinued        3 g 200 mL/hr over 30 Minutes Intravenous Every 6 hours 01/05/21 1657 01/07/21 1034   01/01/21 2330  vancomycin (VANCOREADY) IVPB 1250 mg/250 mL        1,250 mg 166.7 mL/hr over 90 Minutes Intravenous  Once 01/01/21 2302 01/02/21 0301   01/01/21 2315  piperacillin-tazobactam (ZOSYN) IVPB 3.375 g        3.375 g 12.5 mL/hr over 240 Minutes Intravenous  Once 01/01/21 2302 01/02/21 0404       Time Spent in minutes  30   [name] M.D on 01/11/2021 at 10:04 AM  To page go to [email or web address]   [facility] -  Office  [phone]    See all Orders from today for further details    Objective:   Vitals:   01/10/21 2350 01/11/21 0414 01/11/21 0848 01/11/21 0942  BP: 107/65 (!) 146/91  105/70  Pulse: 96 92  84  Resp: 18 17  18   Temp: 98.6 F (37 C) 98.5 F (36.9 C)  98.2 F (36.8 C)  TempSrc: Axillary Oral  Oral  SpO2:  100% 96% 100%  Weight:  63.8 kg    Height:        Wt  Readings from Last 3 Encounters:  01/11/21 63.8 kg  10/04/20 67.1 kg  09/27/20 67.3 kg     Intake/Output Summary (Last 24 hours) at 01/11/2021 1004 Last data filed at 01/10/2021 1814 Gross per 24 hour  Intake 480 ml  Output 775 ml  Net -295 ml    Physical Exam  Awake Alert, No new F.N deficits, Normal affect [place].AT,PERRAL Supple Neck,No JVD, No cervical lymphadenopathy appriciated.  Symmetrical Chest wall movement, Good air movement bilaterally, CTAB RRR,No Gallops, Rubs or new Murmurs, No Parasternal Heave +ve B.Sounds, Abd Soft, No tenderness, No organomegaly appriciated, No rebound - guarding or rigidity. 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  Data Review:    CBC Recent Labs  Lab 01/07/21 0059 01/08/21 0718 01/09/21 0038 01/10/21 0138 01/11/21 0120  WBC 9.0 9.0 8.9 12.1* 11.5*  HGB 8.8* 9.2* 8.0* 7.9* 8.0*  HCT 26.1* 27.0* 23.6* 23.7* 24.8*  PLT 236 361 422* 543* 639*  MCV 98.9 98.5 97.9 99.2 101.2*  MCH 33.3 33.6 33.2 33.1 32.7  MCHC 33.7 34.1 33.9 33.3 32.3  RDW 17.2* 16.7* 16.4* 16.4* 16.7*  LYMPHSABS 1.1 1.2 1.5 1.6 1.6  MONOABS 2.3* 2.5* 2.3* 1.9* 1.6*  EOSABS 0.1 0.1 0.2 0.2 0.1  BASOSABS 0.0 0.0 0.0 0.0 0.1    Recent Labs  Lab 01/04/21 2205 01/05/21 0010 01/05/21 0758 01/07/21 0059 01/08/21 0718 01/09/21 0038 01/10/21 0138 01/11/21 0120  NA  --  134*   < > 133* 128* 132* 131* 129*  K  --  3.6   < > 4.4 3.7 3.9 4.1 4.6  CL  --  103   < > 103 96* 103 103 98  CO2  --  26   < > 23 21* 22 20* 20*  GLUCOSE  --  97   < > 88 90 96 99 74  BUN  --  7*   < > 5* 5* 11 9 7*  CREATININE  --  0.78   < > 0.88 0.89 0.86 0.79 0.81  CALCIUM  --  8.7*   < > 8.7* 8.8* 8.8* 8.9 9.0  AST  --  25   < > 18 17 15 16 22   ALT  --  12   < > 12 10 9 10 12   ALKPHOS  --  70   < > 66 64 63 55 55  BILITOT  --  0.8   < > 0.9 1.2 0.7 0.7 0.8  ALBUMIN  --  2.8*   < > 2.8* 3.0* 2.6* 2.6* 2.7*  MG  --  2.0   < > 2.1 1.6* 2.4 1.8 1.7  CRP  --   --   --  11.6*  16.4* 15.5* 13.0*  --   PROCALCITON <0.10  --    < > <0.10 <0.10 <0.10 <0.10 <0.10  LATICACIDVEN 3.0* 1.2  --   --   --   --   --   --   BNP  --  162.8*   < > 150.3* 263.9* 180.9* 216.2* 170.7*   < > = values in this interval not displayed.    ------------------------------------------------------------------------------------------------------------------ No results for input(s): CHOL, HDL, LDLCALC, TRIG, CHOLHDL, LDLDIRECT in the last 72 hours.  Lab Results  Component Value Date   HGBA1C 4.2 (L) 07/20/2020   ------------------------------------------------------------------------------------------------------------------ No results for input(s): TSH, T4TOTAL, T3FREE, THYROIDAB in the last 72 hours.  Invalid input(s): FREET3  Cardiac Enzymes No results for input(s): CKMB, TROPONINI, MYOGLOBIN in the last 168 hours.  Invalid input(s): CK ------------------------------------------------------------------------------------------------------------------    Component Value Date/Time   BNP 170.7 (H) 01/11/2021 0120    Micro Results Recent Results (from the past 240 hour(s))  Culture, blood (Routine x 2)     Status: None   Collection Time: 01/01/21 10:04 PM   Specimen: BLOOD RIGHT HAND  Result Value Ref Range Status   Specimen Description BLOOD RIGHT HAND  Final   Special Requests   Final    BOTTLES DRAWN AEROBIC ONLY Blood Culture results may not be optimal due to an inadequate volume of blood received in culture bottles   Culture   Final    NO GROWTH 5  DAYS Performed at [facility], [place] [address] , [place] [address]    Report Status 01/06/2021 FINAL  Final  Resp Panel by RT-PCR (Flu A&B, Covid) Nasopharyngeal Swab     Status: None   Collection Time: 01/01/21 10:09 PM   Specimen: Nasopharyngeal Swab; Nasopharyngeal(NP) swabs in vial transport medium  Result Value Ref Range Status   SARS Coronavirus 2 by RT PCR NEGATIVE NEGATIVE Final    Comment:  (NOTE) SARS-CoV-2 target nucleic acids are NOT DETECTED.  The SARS-CoV-2 RNA is generally detectable in upper respiratory specimens during the acute phase of infection. The lowest concentration of SARS-CoV-2 viral copies this assay can detect is 138 copies/mL. A negative result does not preclude SARS-Cov-2 infection and should not be used as the sole basis for treatment or other patient management decisions. A negative result may occur with  improper specimen collection/handling, submission of specimen other than nasopharyngeal swab, presence of viral mutation(s) within the areas targeted by this assay, and inadequate number of viral copies(<138 copies/mL). A negative result must be combined with clinical observations, patient history, and epidemiological information. The expected result is Negative.  Fact Sheet for Patients:  EntrepreneurPulse.com.au  Fact Sheet for Healthcare Providers:  IncredibleEmployment.be  This test is no t yet approved or cleared by the [place] FDA and  has been authorized for detection and/or diagnosis of SARS-CoV-2 by FDA under an Emergency Use Authorization (EUA). This EUA will remain  in effect (meaning this test can be used) for the duration of the COVID-19 declaration under Section 564(b)(1) of the Act, 21 U.S.C.section 360bbb-3(b)(1), unless the authorization is terminated  or revoked sooner.       Influenza A by PCR NEGATIVE NEGATIVE Final   Influenza B by PCR NEGATIVE NEGATIVE Final    Comment: (NOTE) The Xpert Xpress SARS-CoV-2/FLU/RSV plus assay is intended as an aid in the diagnosis of influenza from Nasopharyngeal swab specimens and should not be used as a sole basis for treatment. Nasal washings and aspirates are unacceptable for Xpert Xpress SARS-CoV-2/FLU/RSV testing.  Fact Sheet for Patients: EntrepreneurPulse.com.au  Fact Sheet for Healthcare  Providers: IncredibleEmployment.be  This test is not yet approved or cleared by the [place] FDA and has been authorized for detection and/or diagnosis of SARS-CoV-2 by FDA under an Emergency Use Authorization (EUA). This EUA will remain in effect (meaning this test can be used) for the duration of the COVID-19 declaration under Section 564(b)(1) of the Act, 21 U.S.C. section 360bbb-3(b)(1), unless the authorization is terminated or revoked.  Performed at [facility], [place] [address] [place], [place] [address]   Culture, blood (Routine x 2)     Status: None   Collection Time: 01/01/21 11:34 PM   Specimen: BLOOD LEFT ARM  Result Value Ref Range Status   Specimen Description BLOOD LEFT ARM  Final   Special Requests   Final    BOTTLES DRAWN AEROBIC AND ANAEROBIC Blood Culture adequate volume   Culture   Final    NO GROWTH 5 DAYS Performed at [facility], [place] [address], [place], [place] [address]    Report Status 01/07/2021 FINAL  Final  Culture, blood (routine x 2)     Status: None   Collection Time: 01/04/21 10:00 PM   Specimen: BLOOD RIGHT HAND  Result Value Ref Range Status   Specimen Description BLOOD RIGHT HAND  Final   Special Requests   Final    BOTTLES DRAWN AEROBIC AND ANAEROBIC Blood Culture  adequate volume   Culture   Final    NO GROWTH 5 DAYS Performed at [facility], [place] [address], [place], [place] [address]    Report Status 01/09/2021 FINAL  Final  Culture, blood (routine x 2)     Status: None   Collection Time: 01/04/21 10:09 PM   Specimen: BLOOD RIGHT HAND  Result Value Ref Range Status   Specimen Description BLOOD RIGHT HAND  Final   Special Requests   Final    AEROBIC BOTTLE ONLY Blood Culture results may not be optimal due to an inadequate volume of blood received in culture bottles   Culture   Final    NO GROWTH 5 DAYS Performed at [facility], [place] [address], [place], [place] [address]    Report  Status 01/09/2021 FINAL  Final    Radiology Reports  DG Chest 2 View  Result Date: 01/01/2021 CLINICAL DATA:  Altered mental status and possible sepsis EXAM: CHEST - 2 VIEW COMPARISON:  09/19/2020 FINDINGS: The heart size and mediastinal contours are within normal limits. Both lungs are clear. The visualized skeletal structures are unremarkable. IMPRESSION: No active cardiopulmonary disease. Electronically Signed   By: Inez Catalina M.D.   On: 01/01/2021 21:44   CT HEAD WO CONTRAST  Result Date: 01/06/2021 CLINICAL DATA:  Followup subdural hematoma EXAM: CT HEAD WITHOUT CONTRAST TECHNIQUE: Contiguous axial images were obtained from the base of the skull through the vertex without intravenous contrast. COMPARISON:  01/05/2021 FINDINGS: Brain: Left lateral convexity subdural hematoma is stable, maximal thickness 10 mm measured on the coronal imaging. No additional bleeding or mass-effect. Left-to-right shift of 4 mm. Tiny amount of subdural blood along the falx is unchanged. No new area of bleeding. No acute ischemic infarction. Old small vessel infarctions of the cerebellum and chronic small-vessel ischemic changes of the cerebral hemispheric white matter. Vascular: No acute vascular finding. Skull: Negative Sinuses/Orbits: Clear/normal Other: None IMPRESSION: No change since yesterday. Left convexity subdural is no larger. Mass-effect is the same. Small amount of blood along the falx is the same. Electronically Signed   By: Nelson Chimes M.D.   On: 01/06/2021 13:54   CT HEAD WO CONTRAST  Result Date: 01/05/2021 CLINICAL DATA:  Follow-up subdural hematoma. EXAM: CT HEAD WITHOUT CONTRAST TECHNIQUE: Contiguous axial images were obtained from the base of the skull through the vertex without intravenous contrast. COMPARISON:  Earlier today, additional priors. FINDINGS: Brain: Acute left subdural hematoma is essentially unchanged in size from earlier today, maximal dimension 12 mm in the parietal region,  series 3, image 11, 11 mm earlier today, and 14 mm yesterday. There is trace subdural blood along the superior falx, unchanged from earlier today. Minimal mass effect on the underlying high convexity. Left-to-right midline shift of 5 mm, previously 6 mm. No intraparenchymal or subarachnoid hemorrhage. No evidence of developing ischemia. Remote bilateral cerebellar infarcts. Vascular: Atherosclerosis of skullbase vasculature without hyperdense vessel or abnormal calcification. Skull: No fracture or focal lesion. Sinuses/Orbits: No acute findings. Other: None. IMPRESSION: 1. Acute left subdural hematoma is essentially unchanged in size from earlier today, maximal dimension 12 mm in the parietal region. Left-to-right midline shift of 5 mm, previously 6 mm. 2. Small amount of subdural blood tracks along the superior falx, unchanged. Electronically Signed   By: Keith Rake M.D.   On: 01/05/2021 18:30   CT HEAD WO CONTRAST  Result Date: 01/05/2021 CLINICAL DATA:  Recent subdural hematoma EXAM: CT HEAD WITHOUT CONTRAST TECHNIQUE: Contiguous axial  images were obtained from the base of the skull through the vertex without intravenous contrast. COMPARISON:  Jan 04, 2021 FINDINGS: Brain: The previously noted acute subdural hematoma along the left frontal and temporal lobes extending superiorly to the level of the left convexity is again noted with a maximum thickness of 1.1 cm in the left convexity region, marginally less thick than on the previous study. Subdural size and contour elsewhere appears essentially stable. There is stable mass effect on portions of the left frontal and temporal lobes with 6 mm of midline shift to the right, stable. No new or enlarging subdural hematoma evident. The slight amount of subdural hematoma in the anterior falx is stable without mass effect. There is no evident intra-axial mass or hemorrhage. Mild diffuse atrophy with mild underlying periventricular small vessel disease noted.  Prior cerebellar infarcts are stable, larger on the right than on the left. Note that fourth ventricle is midline. No acute infarct evident. Vascular: No evident hyperdense vessel. Calcification noted in each carotid siphon region. Skull: Bony calvarium appears intact. Sinuses/Orbits: Stable opacification in several ethmoid air cells and superior right maxillary antrum, stable. Orbits appear symmetric bilaterally. Other: Mastoid air cells clear IMPRESSION: Left-sided subdural hematoma again noted, minimally smaller in the region of the convexity and stable elsewhere. Degree of mass effect stable with 6 mm of midline shift toward the right at the level of the foramen of Monro. Fourth ventricle midline. Stable atrophy with periventricular small vessel disease. Prior cerebellar infarcts appear stable. No acute infarct. No intra-axial mass or hemorrhage. There are foci of arterial vascular calcification. There are foci of paranasal sinus disease. Electronically Signed   By: Lowella Grip III M.D.   On: 01/05/2021 12:29   CT HEAD WO CONTRAST  Result Date: 01/04/2021 CLINICAL DATA:  Subdural hematoma EXAM: CT HEAD WITHOUT CONTRAST TECHNIQUE: Contiguous axial images were obtained from the base of the skull through the vertex without intravenous contrast. COMPARISON:  Jan 03, 2021 FINDINGS: Brain: There is an acute appearing subdural hematoma again noted along the left frontal and temporal lobes extending superiorly to the level of the left convexity. The maximum thickness of this subdural hematoma in the left convexity region is 1.4 cm, stable by remeasurement. There is stable mass effect on portions of the left frontal, temporal, anterior parietal lobes. At the level of the foramen of Monro, there is 6 mm of midline shift toward the right, slightly increased compared to 1 day prior. There is subdural hematoma tracking along the falx with slightly more hemorrhage in this area compared to previous study. There is no  appreciable mass effect along the falx, however. There is no intra-axial hemorrhage or mass. There is underlying mild diffuse atrophy with mild periventricular small vessel disease. There are small prior infarcts in the cerebellum bilaterally. No acute infarct is appreciable. Vascular: No hyperdense vessel. There is calcification in each carotid siphon region. Skull: The bony calvarium appears intact. Sinuses/Orbits: There is opacification in several ethmoid air cells as well as in the anterior superior right maxillary antrum. Visualized orbits appear symmetric bilaterally. Other: Mastoid air cells are clear. IMPRESSION: Subdural hematoma on the left with essentially stable size and contour compared to the previous study. There remains mass effect on portions of the left frontal, left temporal, anterior left parietal lobes. There is marginally more midline shift to the right compared to 1 day prior. There is slightly more acute subdural hematoma in the falx without appreciable mass effect in the falcine region. There  is no appreciable intra-axial hemorrhage. There is mild atrophy with mild periventricular small vessel disease. Small chronic appearing infarcts in the cerebellum, stable. No evident acute infarct. Foci of arterial vascular calcification noted. Areas of paranasal sinus disease noted. Electronically Signed   By: Lowella Grip III M.D.   On: 01/04/2021 08:01   CT HEAD WO CONTRAST  Result Date: 01/03/2021 CLINICAL DATA:  Delirium EXAM: CT HEAD WITHOUT CONTRAST TECHNIQUE: Contiguous axial images were obtained from the base of the skull through the vertex without intravenous contrast. COMPARISON:  09/14/2020 FINDINGS: Brain: Hyperdense subdural hemorrhage is present along the left cerebral convexity measuring up to 9 mm thickness. Small volume hyperdense subdural hemorrhage is also present along the falx. Mass effect is mild with 4 mm of rightward midline shift at the foramen of Monro. 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 Gray-white differentiation is preserved. Prominence of the ventricles and sulci reflects generalized parenchymal volume loss. Chronic bilateral cerebellar infarcts. Patchy hypoattenuation in the supratentorial white matter is nonspecific but probably reflects stable chronic microvascular ischemic changes. Vascular: There is intracranial atherosclerotic calcification at the skull base Skull: Unremarkable. Sinuses/Orbits: Patchy paranasal sinus mucosal thickening. No acute orbital abnormality. Other: Mastoid air cells are clear. IMPRESSION: Acute left cerebral convexity subdural hemorrhage. Minimal additional subdural hemorrhage along the falx. Mass effect is mild with minor rightward midline shift. Stable chronic findings detailed above. These results were called by telephone at the time of interpretation on 01/03/2021 at 11:40 am to provider [facility] , who verbally acknowledged these results. Electronically Signed   By: Macy Mis M.D.   On: 01/03/2021 11:44   DG Chest Port 1 View  Result Date: 01/10/2021 CLINICAL DATA:  Shortness of breath EXAM: PORTABLE CHEST 1 VIEW COMPARISON:  Radiograph 01/08/2021, chest CT 02/19/2019 FINDINGS: Unchanged cardiomediastinal silhouette. Decreased opacities in the right lung base. There is no new focal airspace disease. There is no pleural effusion or pneumothorax. Osseous structures are unchanged. Partially visualized cervical spine fusion hardware. IMPRESSION: Decreased opacities in the right lung base, likely improved atelectasis. No new focal airspace disease. Electronically Signed   By: Maurine Simmering   On: 01/10/2021 08:22   DG Chest Port 1 View  Result Date: 01/08/2021 CLINICAL DATA:  Shob/ Altered mental status Evaluate edema. pleural effusion. EXAM: PORTABLE CHEST - 1 VIEW COMPARISON:  01/06/2021 FINDINGS: Interstitial opacities at the right lung base are more conspicuous than on prior study. Left lung  remains clear. Heart size and mediastinal contours are within normal limits. No effusion.  No pneumothorax. Cervical fixation hardware partially visualized. IMPRESSION: 1. Developing infiltrate or atelectasis at the right lung base. Electronically Signed   By: Lucrezia Europe M.D.   On: 01/08/2021 10:49   DG Chest Port 1 View  Result Date: 01/06/2021 CLINICAL DATA:  Shortness of breath, altered mental status EXAM: PORTABLE CHEST 1 VIEW COMPARISON:  01/05/2021 FINDINGS: Mild elevation of the right hemidiaphragm. No consolidation or edema. No significant pleural effusion. Stable blunting of the right costophrenic angle. Stable cardiomediastinal contours. IMPRESSION: No acute process in the chest. Electronically Signed   By: Macy Mis M.D.   On: 01/06/2021 08:25   DG Chest Port 1 View  Result Date: 01/05/2021 CLINICAL DATA:  Shortness of breath EXAM: PORTABLE CHEST 1 VIEW COMPARISON:  01/04/2021 FINDINGS: Heart and mediastinal contours are within normal limits. No focal opacities or effusions. No acute bony abnormality. IMPRESSION: No active disease. Electronically Signed   By: Lennette Bihari  [place] M.D.   On: 01/05/2021 08:28   DG CHEST PORT 1 VIEW  Result Date: 01/04/2021 CLINICAL DATA:  Cough and fevers EXAM: PORTABLE CHEST 1 VIEW COMPARISON:  01/01/2021, 09/19/2020 FINDINGS: Cardiac shadow is within normal limits. The lungs are well aerated bilaterally. Chronic blunting of the right costophrenic angle is noted stable from prior exams. No focal infiltrate is seen. No bony abnormality is noted. IMPRESSION: No active disease. Electronically Signed   By: Inez Catalina M.D.   On: 01/04/2021 22:35   EEG adult  Result Date: 01/06/2021 Lora Havens, MD     01/06/2021  3:09 PM Patient Name: Seth [name] MRN: [id number] Epilepsy Attending: Lora Havens Referring Physician/Provider: Dr [name] Date: 01/06/2021 Duration: 23.31 mins Patient history: 65 year old male with alcohol use who presented with  altered mental status and was found to have left subdural hematoma which did not require any neurosurgical intervention. Had generalized tonic-clonic seizure-like episode this afternoon. EEG to evaluate for seizure Level of alertness: lethargic AEDs during EEG study: Ativan Technical aspects: This EEG study was done with scalp electrodes positioned according to the 10-20 International system of electrode placement. Electrical activity was acquired at a sampling rate of 500Hz  and reviewed with a high frequency filter of 70Hz  and a low frequency filter of 1Hz . EEG data were recorded continuously and digitally stored. Description: EEG showed continuous generalized low amplitude predominantly 2-3 Hz admixed with intermittent 5- 6 Hz theta slowing.   Hyperventilation and photic stimulation were not performed.   ABNORMALITY - Continuous slow, generalized IMPRESSION: This study is suggestive of moderate to severe diffuse encephalopathy, nonspecific etiology. No seizures or epileptiform discharges were seen throughout the recording. Priyanka [name]   Overnight EEG with video  Result Date: 01/07/2021 Lora Havens, MD     01/07/2021  1:04 PM Patient Name: Seth [name] MRN: [id number] Epilepsy Attending: Lora Havens Referring Physician/Provider: Dr Zeb Comfort Duration: 01/06/2021 1447 to 01/07/2021 1136  Patient history: 65 year old male with alcohol use who presented with altered mental status and was found to have left subdural hematoma which did not require any neurosurgical intervention. Had generalized tonic-clonic seizure-like episode this afternoon. EEG to evaluate for seizure  Level of alertness: lethargic  AEDs during EEG study: Ativan, LEV  Technical aspects: This EEG study was done with scalp electrodes positioned according to the 10-20 International system of electrode placement. Electrical activity was acquired at a sampling rate of 500Hz  and reviewed with a high frequency filter of 70Hz  and  a low frequency filter of 1Hz . EEG data were recorded continuously and digitally stored.  Description: The posterior dominant rhythm consists of 8 Hz activity of moderate voltage (25-35 uV) seen predominantly in posterior head regions, symmetric and reactive to eye opening and eye closing. Sleep was characterized by vertex waves, sleep spindles (12 to 14 Hz), maximal frontocentral region. EEG initially showed continuous generalized low amplitude predominantly 2-3 Hz admixed with intermittent 5- 6 Hz theta slowing which gradually improved to intermittent 3-5hz  theta-delta slowing. Sharp transients were seen in left fronto-centro-temporal region. Hyperventilation and photic stimulation were not performed.    ABNORMALITY - Continuous slow, generalized - Intermittent slow, generalized  IMPRESSION: This study was initially suggestive of moderate diffuse encephalopathy which gradually improved to mild diffuse encephalopathy, nonspecific etiology but likely related to postictal state. No seizures or definite epileptiform discharges were seen throughout the recording.  Lora Havens   ECHOCARDIOGRAM COMPLETE  Result Date: 01/07/2021    ECHOCARDIOGRAM REPORT  Patient Name:   Seth [name] Date of Exam: 01/07/2021 Medical Rec #:  [id number]       Height:       75.0 in Accession #:    [id number]      Weight:       138.0 lb Date of Birth:  November 19, 1955        BSA:          1.874 m Patient Age:    12 years        BP:           156/99 mmHg Patient Gender: M               HR:           96 bpm. Exam Location:  Inpatient Procedure: 2D Echo, Cardiac Doppler and Color Doppler Indications:    Cardiac arrest  History:        Patient has prior history of Echocardiogram examinations, most                 recent 09/20/2019. COPD; Risk Factors:Hypertension.  Sonographer:    Cammy Brochure Referring Phys: [phone] [facility] F DAVIS  Sonographer Comments: Image acquisition challenging due to uncooperative patient and Image acquisition  challenging due to respiratory motion. IMPRESSIONS  1. Left ventricular ejection fraction, by estimation, is 60 to 65%. The left ventricle has normal function. The left ventricle has no regional wall motion abnormalities. There is mild left ventricular hypertrophy. Left ventricular diastolic parameters are consistent with Grade I diastolic dysfunction (impaired relaxation).  2. Right ventricular systolic function is normal. The right ventricular size is normal.  3. Left atrial size was mild to moderately dilated.  4. The mitral valve is normal in structure. Trivial mitral valve regurgitation. No evidence of mitral stenosis.  5. The aortic valve was not well visualized. There is mild calcification of the aortic valve. Aortic valve regurgitation is not visualized. Mild aortic valve sclerosis is present, with no evidence of aortic valve stenosis.  6. The inferior vena cava is normal in size with greater than 50% respiratory variability, suggesting right atrial pressure of 3 mmHg. FINDINGS  Left Ventricle: Left ventricular ejection fraction, by estimation, is 60 to 65%. The left ventricle has normal function. The left ventricle has no regional wall motion abnormalities. The left ventricular internal cavity size was normal in size. There is  mild left ventricular hypertrophy. Left ventricular diastolic parameters are consistent with Grade I diastolic dysfunction (impaired relaxation). Right Ventricle: The right ventricular size is normal. No increase in right ventricular wall thickness. Right ventricular systolic function is normal. Left Atrium: Left atrial size was mild to moderately dilated. Right Atrium: Right atrial size was normal in size. Pericardium: There is no evidence of pericardial effusion. Mitral Valve: The mitral valve is normal in structure. Trivial mitral valve regurgitation. No evidence of mitral valve stenosis. Tricuspid Valve: The tricuspid valve is normal in structure. Tricuspid valve regurgitation is  mild . No evidence of tricuspid stenosis. Aortic Valve: The aortic valve was not well visualized. There is mild calcification of the aortic valve. Aortic valve regurgitation is not visualized. Mild aortic valve sclerosis is present, with no evidence of aortic valve stenosis. Aortic valve mean gradient measures 3.0 mmHg. Aortic valve peak gradient measures 4.9 mmHg. Aortic valve area, by VTI measures 3.14 cm. Pulmonic Valve: The pulmonic valve was not well visualized. Pulmonic valve regurgitation is not visualized. No evidence of pulmonic stenosis. Aorta: The aortic root is normal  in size and structure. Venous: The inferior vena cava is normal in size with greater than 50% respiratory variability, suggesting right atrial pressure of 3 mmHg. IAS/Shunts: No atrial level shunt detected by color flow Doppler.  LEFT VENTRICLE PLAX 2D LVIDd:         3.30 cm  Diastology LVIDs:         2.20 cm  LV e' medial:    5.66 cm/s LV PW:         1.30 cm  LV E/e' medial:  8.7 LV IVS:        1.30 cm  LV e' lateral:   9.14 cm/s LVOT diam:     2.00 cm  LV E/e' lateral: 5.4 LV SV:         59 LV SV Index:   32 LVOT Area:     3.14 cm  RIGHT VENTRICLE RV S prime:     12.80 cm/s TAPSE (M-mode): 2.5 cm LEFT ATRIUM             Index LA diam:        2.50 cm 1.33 cm/m LA Vol (A2C):   50.8 ml 27.10 ml/m LA Vol (A4C):   49.0 ml 26.12 ml/m LA Biplane Vol: 49.6 ml 26.46 ml/m  AORTIC VALVE AV Area (Vmax):    2.69 cm AV Area (Vmean):   2.52 cm AV Area (VTI):     3.14 cm AV Vmax:           111.00 cm/s AV Vmean:          89.000 cm/s AV VTI:            0.188 m AV Peak Grad:      4.9 mmHg AV Mean Grad:      3.0 mmHg LVOT Vmax:         95.00 cm/s LVOT Vmean:        71.300 cm/s LVOT VTI:          0.188 m LVOT/AV VTI ratio: 1.00  AORTA Ao Root diam: 3.90 cm MITRAL VALVE               TRICUSPID VALVE MV Area (PHT): 5.02 cm    TR Peak grad:   21.0 mmHg MV Decel Time: 151 msec    TR Vmax:        229.00 cm/s MV E velocity: 49.20 cm/s MV A velocity: 70.60  cm/s  SHUNTS MV E/A ratio:  0.70        Systemic VTI:  0.19 m                            Systemic Diam: 2.00 cm Glori Bickers MD Electronically signed by Glori Bickers MD Signature Date/Time: 01/07/2021/2:58:36 PM    Final

## 2021-01-11 NOTE — TOC Progression Note (Addendum)
Transition of Care [facility]) - Progression Note    Patient Details  Name: Seth [name] MRN: [id number] Date of Birth: 1956-08-07  Transition of Care [facility]) CM/SW [place], LCSW Phone Number: 01/11/2021, 10:57 AM  Clinical Narrative:    10:57am-CSW submitted clinicals to insurance: Ref# [id number].   11:30am-CSW received call from [facility] stating that patient is not medically stable and would need orthostatic hypotension addressed. MD aware and making medication changes. CSW will resubmit insurance auth tomorrow.    Expected Discharge Plan: Skilled Nursing Facility Barriers to Discharge: Insurance Authorization  Expected Discharge Plan and Services Expected Discharge Plan: [place] In-house Referral: Clinical Social Work   Post Acute Care Choice: [place] Living arrangements for the past 2 months: [place] Determinants of Health (SDOH) Interventions    Readmission Risk Interventions Readmission Risk Prevention Plan 06/04/2019  Transportation Screening Complete  PCP or Specialist Appt within 5-7 Days Complete  Home Care Screening Complete  Medication Review (RN CM) Complete  Some recent data might be hidden

## 2021-01-11 NOTE — Care Management Important Message (Signed)
Important Message  Patient Details  Name: Seth [name] MRN: [id number] Date of Birth: November 20, 1955   Medicare Important Message Given:  Yes - Important Message mailed due to current National Emergency   Verbal consent obtained due to current National Emergency  Relationship to patient: Self Contact Name: Wah Call Date: 01/11/21  Time: 1108 Phone: [phone] Outcome: No Answer/Busy Important Message mailed to: Patient address on file    [name] 01/11/2021, 11:08 AM

## 2021-01-12 DIAGNOSIS — N179 Acute kidney failure, unspecified: Secondary | ICD-10-CM

## 2021-01-12 DIAGNOSIS — E875 Hyperkalemia: Secondary | ICD-10-CM

## 2021-01-12 DIAGNOSIS — J449 Chronic obstructive pulmonary disease, unspecified: Secondary | ICD-10-CM

## 2021-01-12 DIAGNOSIS — E162 Hypoglycemia, unspecified: Secondary | ICD-10-CM

## 2021-01-12 DIAGNOSIS — I10 Essential (primary) hypertension: Secondary | ICD-10-CM | POA: Diagnosis not present

## 2021-01-12 DIAGNOSIS — J690 Pneumonitis due to inhalation of food and vomit: Secondary | ICD-10-CM

## 2021-01-12 DIAGNOSIS — Z7289 Other problems related to lifestyle: Secondary | ICD-10-CM | POA: Diagnosis not present

## 2021-01-12 DIAGNOSIS — E43 Unspecified severe protein-calorie malnutrition: Secondary | ICD-10-CM

## 2021-01-12 LAB — COMPREHENSIVE METABOLIC PANEL
ALT: 10 U/L (ref 0–44)
ALT: 12 U/L (ref 0–44)
AST: 18 U/L (ref 15–41)
AST: 20 U/L (ref 15–41)
Albumin: 2.6 g/dL — ABNORMAL LOW (ref 3.5–5.0)
Albumin: 2.8 g/dL — ABNORMAL LOW (ref 3.5–5.0)
Alkaline Phosphatase: 52 U/L (ref 38–126)
Alkaline Phosphatase: 53 U/L (ref 38–126)
Anion gap: 8 (ref 5–15)
Anion gap: 11 (ref 5–15)
BUN: 7 mg/dL — ABNORMAL LOW (ref 8–23)
BUN: 9 mg/dL (ref 8–23)
CO2: 22 mmol/L (ref 22–32)
CO2: 22 mmol/L (ref 22–32)
Calcium: 9.1 mg/dL (ref 8.9–10.3)
Calcium: 9.3 mg/dL (ref 8.9–10.3)
Chloride: 98 mmol/L (ref 98–111)
Chloride: 98 mmol/L (ref 98–111)
Creatinine, Ser: 0.88 mg/dL (ref 0.61–1.24)
Creatinine, Ser: 0.81 mg/dL (ref 0.61–1.24)
GFR, Estimated: >60 mL/min (ref >60)
GFR, Estimated: >60 mL/min (ref >60)
Glucose, Bld: 68 mg/dL — ABNORMAL LOW (ref 70–99)
Glucose, Bld: 82 mg/dL (ref 70–99)
Potassium: 4.3 mmol/L (ref 3.5–5.1)
Potassium: 4.4 mmol/L (ref 3.5–5.1)
Sodium: 128 mmol/L — ABNORMAL LOW (ref 135–145)
Sodium: 131 mmol/L — ABNORMAL LOW (ref 135–145)
Total Bilirubin: 0.6 mg/dL (ref 0.3–1.2)
Total Bilirubin: 0.5 mg/dL (ref 0.3–1.2)
Total Protein: 5.9 g/dL — ABNORMAL LOW (ref 6.5–8.1)
Total Protein: 6.3 g/dL — ABNORMAL LOW (ref 6.5–8.1)

## 2021-01-12 LAB — CBC WITH DIFFERENTIAL/PLATELET
Abs Immature Granulocytes: 0.11 10*3/uL — ABNORMAL HIGH (ref 0.00–0.07)
Basophils Absolute: 0.1 10*3/uL (ref 0.0–0.1)
Basophils Relative: 1 %
Eosinophils Absolute: 0.1 10*3/uL (ref 0.0–0.5)
Eosinophils Relative: 1 %
HCT: 24.9 % — ABNORMAL LOW (ref 39.0–52.0)
Hemoglobin: 8 g/dL — ABNORMAL LOW (ref 13.0–17.0)
Immature Granulocytes: 1 %
Lymphocytes Relative: 13 %
Lymphs Abs: 1.7 10*3/uL (ref 0.7–4.0)
MCH: 32.8 pg (ref 26.0–34.0)
MCHC: 32.1 g/dL (ref 30.0–36.0)
MCV: 102 fL — ABNORMAL HIGH (ref 80.0–100.0)
Monocytes Absolute: 1.9 10*3/uL — ABNORMAL HIGH (ref 0.1–1.0)
Monocytes Relative: 14 %
Neutro Abs: 9.6 10*3/uL — ABNORMAL HIGH (ref 1.7–7.7)
Neutrophils Relative %: 70 %
Platelets: 783 10*3/uL — ABNORMAL HIGH (ref 150–400)
RBC: 2.44 MIL/uL — ABNORMAL LOW (ref 4.22–5.81)
RDW: 16.6 % — ABNORMAL HIGH (ref 11.5–15.5)
WBC: 13.5 10*3/uL — ABNORMAL HIGH (ref 4.0–10.5)
nRBC: 0 % (ref 0.0–0.2)

## 2021-01-12 LAB — BRAIN NATRIURETIC PEPTIDE: B Natriuretic Peptide: 204.4 pg/mL — ABNORMAL HIGH (ref 0.0–100.0)

## 2021-01-12 LAB — CBC
HCT: 25.2 % — ABNORMAL LOW (ref 39.0–52.0)
Hemoglobin: 8.1 g/dL — ABNORMAL LOW (ref 13.0–17.0)
MCH: 32.5 pg (ref 26.0–34.0)
MCHC: 32.1 g/dL (ref 30.0–36.0)
MCV: 101.2 fL — ABNORMAL HIGH (ref 80.0–100.0)
Platelets: 885 10*3/uL — ABNORMAL HIGH (ref 150–400)
RBC: 2.49 MIL/uL — ABNORMAL LOW (ref 4.22–5.81)
RDW: 16.4 % — ABNORMAL HIGH (ref 11.5–15.5)
WBC: 14.9 10*3/uL — ABNORMAL HIGH (ref 4.0–10.5)
nRBC: 0 % (ref 0.0–0.2)

## 2021-01-12 LAB — PHOSPHORUS: Phosphorus: 4 mg/dL (ref 2.5–4.6)

## 2021-01-12 LAB — MAGNESIUM
Magnesium: 1.9 mg/dL (ref 1.7–2.4)
Magnesium: 1.8 mg/dL (ref 1.7–2.4)

## 2021-01-12 MED ORDER — LORAZEPAM 2 MG/ML IJ SOLN
0.0000 mg | Freq: Four times a day (QID) | INTRAMUSCULAR | Status: DC
Start: 2021-01-12 — End: 2021-01-13
  Administered 2021-01-12 – 2021-01-13 (×4): 2 mg via INTRAVENOUS
  Filled 2021-01-12 (×3): qty 1

## 2021-01-12 MED ORDER — LORAZEPAM 2 MG/ML IJ SOLN: 0.0000 mg | Freq: Two times a day (BID) | INTRAMUSCULAR | Status: DC

## 2021-01-12 MED ORDER — LORAZEPAM 2 MG/ML IJ SOLN
1.0000 mg | INTRAMUSCULAR | Status: DC | PRN
Start: 2021-01-12 — End: 2021-01-13

## 2021-01-12 MED ORDER — PIPERACILLIN-TAZOBACTAM 3.375 G IVPB
3.3750 g | Freq: Three times a day (TID) | INTRAVENOUS | Status: DC
  Administered 2021-01-12 – 2021-01-15 (×8): 3.375 g via INTRAVENOUS
  Filled 2021-01-12 (×8): qty 50

## 2021-01-12 MED ORDER — LACTATED RINGERS IV SOLN: INTRAVENOUS | Status: AC

## 2021-01-12 MED ORDER — LORAZEPAM 1 MG PO TABS: 1.0000 mg | ORAL_TABLET | ORAL | Status: DC | PRN

## 2021-01-12 MED ORDER — PANTOPRAZOLE SODIUM 40 MG PO TBEC
40.0000 mg | DELAYED_RELEASE_TABLET | Freq: Every day | ORAL | Status: DC
  Administered 2021-01-12 – 2021-01-13 (×2): 40 mg via ORAL
  Filled 2021-01-12 (×2): qty 1

## 2021-01-12 MED ORDER — SODIUM CHLORIDE 0.9 % IV BOLUS
250.0000 mL | Freq: Once | INTRAVENOUS | Status: AC
  Administered 2021-01-12: 250 mL via INTRAVENOUS

## 2021-01-12 MED ORDER — THIAMINE HCL 100 MG/ML IJ SOLN
500.0000 mg | Freq: Three times a day (TID) | INTRAVENOUS | Status: DC
  Administered 2021-01-12 – 2021-01-13 (×5): 500 mg via INTRAVENOUS
  Filled 2021-01-12 (×8): qty 5

## 2021-01-12 NOTE — Progress Notes (Signed)
Pharmacy Antibiotic Note  Seth [name] is a 65 y.o. male admitted on 01/01/2021 with aspiration pneumonia.  Pharmacy has been consulted for Zosyn dosing. Patient has been on Unasyn >> Augmentin but now wbc is rising and fever curve is trending up (Tm 100.5). SCr is stable.   Plan: Zosyn 3.375g IV every 8 hours - extended infusion.  Monitor renal function, culture results, and clinical status.   Height: 6\' 3"  (190.5 cm) Weight: 63.8 kg (140 lb 10.5 oz) IBW/kg (Calculated) : 84.5  Temp (24hrs), Avg:99.4 F (37.4 C), Min:98.2 F (36.8 C), Max:100.5 F (38.1 C)  Recent Labs  Lab 01/08/21 0718 01/09/21 0038 01/10/21 0138 01/11/21 0120 01/12/21 0043 01/12/21 1649  WBC 9.0 8.9 12.1* 11.5* 13.5* 14.9*  CREATININE 0.89 0.86 0.79 0.81 0.88  --     Estimated Creatinine Clearance: 77.5 mL/min (by C-G formula based on SCr of 0.88 mg/dL).    No Known Allergies  Antimicrobials this admission: Unasyn 5/11 >> 5/13 Augmentin 5/13 >>5/18 Zosyn 5/18 >>  Dose adjustments this admission:  Microbiology results: 5/10 BCx: negative  5/18 BCx:  5/18 UCx:  5/18 MRSA PCR:   Thank you for allowing pharmacy to be a part of this patient's care.  Sloan Leiter, PharmD, BCPS, BCCCP Clinical Pharmacist Please refer to [facility] for [place] numbers 01/12/2021 5:27 PM

## 2021-01-12 NOTE — Progress Notes (Signed)
SLP Cancellation Note  Patient Details Name: Seth [name] MRN: [id number] DOB: Jun 04, 1956   Cancelled treatment:       Reason Eval/Treat Not Completed: Patient at procedure or test/unavailable (Pt receiving care from NT. SLP will follow up as schedule allows.)  [name], [place], [place] Office number [phone] Pager [phone]   [name] 01/12/2021, 9:48 AM

## 2021-01-12 NOTE — Progress Notes (Signed)
Physical Therapy Treatment Patient Details Name: Seth [name] MRN: [id number] DOB: Oct 27, 1955 Today's Date: 01/12/2021    History of Present Illness Seth [name] is a 65 y.o. male who presents to ED with AMS, 1 fall, and vomiting. Patient admitted with SIRS, AKI, hypoglycemia and dehydration. CT  5/11 (+) acute L SDH.  5/12  he had acute change in mental status and seizure and right hemibody weakness.   EEG 5/12-513.  Right hemibody weakness resolved 5/13.  PMHx significant for HTN, ETOH & tobacco use disorder, COVID-19 ~3 mos ago, Hx of CVA, PVD, L transmet amp 09/22/2020, HTN, COPD    PT Comments    Patient received in bed, pleasantly confused- tells me he is 65 years old and seems to think he is not in the hospital- but very pleasant and cooperative. Able to get to EOB and attempt standing, however from this point session limited by blood pressures as below. Unfortunately unable to attempt further transfers or gait today due to safety concerns given vitals. HR as high as 131BPM with activity on room air today but recovered to resting rate well. Left in bed with all needs met, bed alarm active, and RN aware of patient status. May very well benefit from abdominal binder. Continue to recommend SNF.  Supine 154/85  Sitting EOB 129/72  Standing 69/61  Return to sitting 117/60  Supine 147/73  HOB 41 degrees 186/101, 196/92 at recheck  HOB 12 degrees 150/82    Follow Up Recommendations  SNF     Equipment Recommendations  Rolling [place] with 5" wheels;3in1 (PT);Wheelchair cushion (measurements PT);Wheelchair (measurements PT)    Recommendations for Other Services       Precautions / Restrictions Precautions Precautions: Fall Precaution Comments: orthostatic Restrictions Weight Bearing Restrictions: No    Mobility  Bed Mobility Overal bed mobility: Needs Assistance Bed Mobility: Supine to Sit;Sit to Supine     Supine to sit: Min guard;HOB elevated Sit to supine: Min  assist   General bed mobility comments: min guard to get to EOB and scoot forward, needed more time to complete today; MinA for back to bed as he needed help managing BLEs    Transfers Overall transfer level: Needs assistance Equipment used: Rolling Urias (2 wheeled) Transfers: Sit to/from Stand Sit to Stand: Mod assist         General transfer comment: ModA to boost to standing with RW, mild R lean but unable to tolerate for more than 30 seconds due to orthostatics  Ambulation/Gait             General Gait Details: deferred 2/2 hypotension   Stairs             Wheelchair Mobility    Modified Rankin (Stroke Patients Only)       Balance Overall balance assessment: Needs assistance;History of Falls Sitting-balance support: Feet supported;No upper extremity supported Sitting balance-[name] Scale: Fair Sitting balance - Comments: able to maintain midline sitting at EOB, close S for safety with activities, very mild posterior drift   Standing balance support: Bilateral upper extremity supported;During functional activity Standing balance-[name] Scale: Poor Standing balance comment: MinA for balance statically                            Cognition Arousal/Alertness: Awake/alert Behavior During Therapy: WFL for tasks assessed/performed Overall Cognitive Status: Impaired/Different from baseline Area of Impairment: Attention;Memory;Following commands;Safety/judgement;Problem solving;Awareness  Current Attention Level: Sustained Memory: Decreased short-term memory Following Commands: Follows one step commands consistently;Follows one step commands with increased time Safety/Judgement: Decreased awareness of deficits;Decreased awareness of safety Awareness: Intellectual Problem Solving: Slow processing;Requires verbal cues;Requires tactile cues;Decreased initiation General Comments: able to follow simple one step verbal commands  consistently; possibly hallucinating as he told me a man came in and slept on the floor next to the bed about an hour ago; more confused than last time as he tells me he is 65, and asks if he needs t make an appointment to come see the doctor about his blood pressure      Exercises      General Comments General comments (skin integrity, edema, etc.): severe orthostatic drop- see note for details. May need abdominal binder.      Pertinent Vitals/Pain Pain Assessment: Faces Faces Pain Scale: No hurt Pain Intervention(s): Limited activity within patient's tolerance;Monitored during session    Home Living                      Prior Function            PT Goals (current goals can now be found in the care plan section) Acute Rehab PT Goals Patient Stated Goal: No goals stated. PT Goal Formulation: With patient Time For Goal Achievement: 01/16/21 Potential to Achieve Goals: Good Progress towards PT goals: Progressing toward goals (very slowly, limited by vitals)    Frequency    Min 3X/week      PT Plan Current plan remains appropriate    Co-evaluation              AM-PAC PT "6 Clicks" Mobility   Outcome Measure  Help needed turning from your back to your side while in a flat bed without using bedrails?: None Help needed moving from lying on your back to sitting on the side of a flat bed without using bedrails?: A Little Help needed moving to and from a bed to a chair (including a wheelchair)?: A Lot Help needed standing up from a chair using your arms (e.g., wheelchair or bedside chair)?: A Lot Help needed to walk in hospital room?: Total Help needed climbing 3-5 steps with a railing? : Total 6 Click Score: 13    End of Session Equipment Utilized During Treatment: Gait belt Activity Tolerance: Treatment limited secondary to medical complications (Comment) (blood pressures) Patient left: in bed;with bed alarm set;with call bell/phone within reach Nurse  Communication: Mobility status;Other (comment) (blood pressures during session) PT Visit Diagnosis: Unsteadiness on feet (R26.81);Repeated falls (R29.6);Muscle weakness (generalized) (M62.81);Dizziness and giddiness (R42)     Time: 1610-9604 PT Time Calculation (min) (ACUTE ONLY): 30 min  Charges:  $Therapeutic Activity: 23-37 mins                     Windell Norfolk, DPT, PN1   Supplemental Physical Therapist [place]    Pager [phone] Acute Rehab Office [phone]

## 2021-01-12 NOTE — Progress Notes (Addendum)
  Speech Language Pathology Treatment: Dysphagia  Patient Details Name: Seth [name] MRN: [id number] DOB: [birth year] Today's Date: 01/12/2021 Time: 9373-4287 SLP Time Calculation (min) (ACUTE ONLY): 13.88 min  Assessment / Plan / Recommendation Clinical Impression  Pt was seen for dysphagia treatment. Pt's nurse reported that the pt has been tolerating the current diet without overt s/sx of aspiration and that pocketing has improved. Pt stated that he has enjoyed the dysphagia 2 diet and that he prefers the dysphagia 2 consistency to the trials of dysphagia 3 which were provided today. No s/sx of aspiration were noted with dysphagia 3, dysphagia 2, meds given whole with thin liquids, or thin liquids via straw using consecutive swallows. Pt demonstrated prolonged mastication/bolus formation with solids. Pocketing was not observed during this session, but trials were limited per pt's request. Pt's current diet will be continued at this time. SLP will follow for treatment.    HPI HPI: Seth [name] is a 65 y.o. male  presents with Acute mental status decline - likely on by alcohol abuse, poor oral intake causing hypoglycemia, hypomagnesemia, dehydration, AKI and hyperkalemia, with possible DTs. CT showing SDH. PMH significant for history of CVA, PVD s/p LLE angiography with DES placed to popliteal artery, left foot osteomyelitis s/p transmetatarsal hypertension 09/22/2020, COPD, hypertension, alcohol use, and tobacco use.      SLP Plan  Continue with current plan of care       Recommendations  Diet recommendations: Dysphagia 2 (fine chop);Thin liquid Liquids provided via: Cup;Straw Medication Administration: Whole meds with puree/thin liquids Supervision: Intermittent supervision to cue for compensatory strategies Compensations: Slow rate;Small sips/bites;Follow solids with liquid;Other (Comment) (Check for pocketing) Postural Changes and/or Swallow Maneuvers: Seated upright 90 degrees                 Oral Care Recommendations: Oral care before and after PO Follow up Recommendations: Home health SLP SLP Visit Diagnosis: Dysphagia, oral phase (R13.11) Plan: Continue with current plan of care       Seth [name] I. Seth [name], [place], [place] Office number [phone] Pager [phone]                Seth [name] 01/12/2021, 5:13 PM

## 2021-01-12 NOTE — Progress Notes (Signed)
PROGRESS NOTE    Seth [name]  [id number] DOB: [birth year] DOA: 01/01/2021 PCP: Lucianne Lei, MD   Brief Narrative:  Patient is a 65 year old African-American male with a past medical history significant for but not limited to history of CVA, history of PVD status post left lower extremity angiography with DES placed to the popliteal artery, history of left foot osteomyelitis status post transmetatarsal amputation on 09/02/2020, history of COPD, hypertension, alcohol abuse and tobacco use presented to the ED for altered mental status.  In the ED his work-up was most consistent with dehydration, hypoglycemia and SDH.  His hospital course was complicated by the development of aspiration pneumonia and febrile seizures.  Patient stayed in ICU for a day and continues to spike temperatures now.  We have escalated his antibiotics and reculture the patient.  He is not hallucinating more and so we have resumed his CIWA score and I have asked neurology to weigh in again.  Assessment & Plan:   Principal Problem:   SIRS (systemic inflammatory response syndrome) (HCC) Active Problems:   COPD (chronic obstructive pulmonary disease) (HCC)   Hyperkalemia   AKI (acute kidney injury) ([place])   Hypoglycemia   Alcohol use   History of CVA (cerebrovascular accident)   VT (ventricular tachycardia) (HCC)   Benign essential HTN   Benign prostatic hyperplasia   Depression   ETOH abuse   Hyponatremia   Protein-calorie malnutrition, severe  Acute Decline in Mental Status   -Likely on by alcohol abuse, poor oral intake causing hypoglycemia, hypomagnesemia, dehydration, AKI and hyperkalemia, with possible DTs.  -CT Scan of Head showing SDH and latest CT scan showed on 01/06/21 "No change since yesterday. Left convexity subdural is no larger. Mass-effect is the same. Small amount of blood along the falx is the same." -He has been extensively counseled to quit alcohol intake and was given hydration with IV  fluids, -DTs have resolved.   -For SDH he was seen by neurosurgeon Dr. Saintclair Halsted, multiple scans are stable.   -Patient continues to hallucinate and was febrile to 100.5 today; will repeat blood cultures and escalate antibiotics back to IV Zosyn.  We will also obtain urinalysis and urine culture -Plavix for now has been discontinued, low-dose aspirin upon discharge.  Post discharge he needs to follow-up with Dr. Saintclair Halsted again for a repeat CT scan.  Aspiration. PNA due to DTs  but now with new fever and worsening leukocytosis -Chest x-ray from 01/10/21 showed "Decreased opacities in the right lung base, likely improved atelectasis. No new focal airspace disease." -C/w Guaifenesin 600 mg po BIDprn  -C/w Breo-Ellipta 1 puff IH Daily and with Albuterol 2 puff IH q6hprn Wheezing  -Patient's WBC is trending the wrong way and is gone from 11.5 is now 14.9 today and he is spiked a temperature of 100.5 and became tachycardic -We will bolus to 50 mL and resume IV fluids with nectarines of 10,000/h -Currently on Augmentin and this was continued but given his spiking temperature of 100.5 have escalated back to IV Zosyn and obtain blood cultures x2 and a urinalysis and urine culture, speech therapy following on dysphagia 2 diet, patient was to stop Augmentin on 01/13/2021 however will continue IV Zosyn for now -Will repeat CXR in the AM and follow-up on blood cultures x2 and urine culture and urinalysis has been ordered today -Continue to monitor temperature curve and WBC trend  History of CVA.   -Supportive care continue ASA 81 mg po Daily and with Rosuvastatin 10 mg po  Daily .  Seizures likely due to fever from aspiration pneumonia on 01/05/2021 in the setting of SDH.   -Appreciate neurology input -EEG nonacute -On Levitiracetam 500 mg po BID and will continue -Given his encephalopathy and hallucinations we have asked neurology to reevaluate and Dr. Hortense Ramal will see the patient in the morning  History of PAD  s/p left lower extremity angiography with DES placement to popliteal artery, s/p left transmetatarsal amputation in the past.   -C/w Supportive care. Was on DAPT with statin.   -Case discussed with his vascular surgeon Dr. Donzetta Matters on 04/2021, continue Aspirin 81 mg po Daily and Rosuvastatin for now.   -Can hold Plavix indefinitely if needed for SDH.  Alcohol Abuse with Hallucinations -Counseled to quit  -Last drink on 12/27/2020 ?,  He was slightly more confused on 01/03/2021 and could have been going into early DTs, -We will also start high-dose vitamin B-1 500 mg every 8 hours scheduled -Now has finished CIWA protocol but will resume given his hallucinations.  Continue with PRN Haldol now. -Last CIWA Score was 11  COPD -Stable and currently not on   Tobacco Abuse -Smoking Cessastion Counseling given -C/w Nicotine Patch 21 mg TD q24h  Hypokalemia -Improved -Continue to Monitor and Trend -Repeat CMP in the AM   Thrombocytosis -Likely reactive in the setting of infection -Patient's platelet count is worsening from 639 is now 885 -Continue monitor and trend and repeat CBC in a.m.  Macrocytic Anemia.   -Borderline B12 & Iron levels, B12 & Iron replaced, given a unit of packed RBC on 01/06/2021.  -C/w Ferrous Sulfate 325 mg po BID  -hemoglobin/hematocrit is relatively stable now at 8.1/25.2 -Resumed PPI, outpt GI follow up .  Hyponatremia.    -Ur Osm << Ser Osm, would suggest dehydration,  -Continues to be orthostatic,  -Resume IVF; Bolus 250 mL NS and resume LR at 75 mL/hr  -Na+ went from 128 -> 132 -> 131 -> 129 -> 128 -Continue to Monitor and Trend -Repeat CMP in the AM .  ? V.Tach in the CT scan department. -Questionable true event versus artifact, echo shows a preserved EF, on high-dose beta-blocker.   -Continue to Monitor electrolytes closely. -Currently he is Symptom-free.  HTN  -His Metoprolol Tartrate 100 mg po BID was adjusted 01/09/21. -C/w Clonidine 0.1 mg po  q6hprn SBP>150 mg -Continue to Monitor BP per Protocol  -Last BP reading was  Significant Orthostatic Hypotension -Has TED Hose -Could be Autonomic Dysfunction -BP dropped from 154/85 Supine -> 69/61 Standing  -May need to [facility] Neurology -Place Abdominal binder -Repeat Orthostatic VS; May need some more IVF   Hypomagnesemia -Improved and resolved -Patient's magnesium level is now 1.9 -Continue monitor and trend repeat magnesium level in AM  Severe Protein Calorie Malnutrition/Underweight   -Nutritionist was consulted for further evaluation recommendations and recommending continuing to advance diet per SLP recommendations that he is now on a dysphagia 2 diet -They have added boost plus p.o. 3 times daily as well as Magic cup and boost breeze 3 times daily and continue with the CIWA scale   DVT prophylaxis: SCDs Code Status: FULL CODE  Family Communication: No family present at bedside Disposition Plan: Pending further Improvement and improvement in his orthostatics and evaluation of his fever and worsening WBC Status is: Inpatient  Remains inpatient appropriate because:Unsafe d/c plan, IV treatments appropriate due to intensity of illness or inability to take PO and Inpatient level of care appropriate due to severity of illness  Dispo: The patient is from: Home              Anticipated d/c is to: SNF              Patient currently is not medically stable to d/c.   Difficult to place patient No  Consultants:   Neurology  Neurosurgery   Procedures:  EEG This study was initially suggestive of moderate diffuse encephalopathy which gradually improved to mild diffuse encephalopathy, nonspecific etiology but likely related to postictal state.No seizures or definite epileptiform discharges were seen throughout the recording.   Antimicrobials:  Anti-infectives (From admission, onward)   Start     Dose/Rate Route Frequency Ordered Stop   01/07/21 2030   amoxicillin-clavulanate (AUGMENTIN) 875-125 MG per tablet 1 tablet        1 tablet Oral Every 12 hours 01/07/21 1944     01/05/21 1800  Ampicillin-Sulbactam (UNASYN) 3 g in sodium chloride 0.9 % 100 mL IVPB  Status:  Discontinued        3 g 200 mL/hr over 30 Minutes Intravenous Every 6 hours 01/05/21 1657 01/07/21 1034   01/01/21 2330  vancomycin (VANCOREADY) IVPB 1250 mg/250 mL        1,250 mg 166.7 mL/hr over 90 Minutes Intravenous  Once 01/01/21 2302 01/02/21 0301   01/01/21 2315  piperacillin-tazobactam (ZOSYN) IVPB 3.375 g        3.375 g 12.5 mL/hr over 240 Minutes Intravenous  Once 01/01/21 2302 01/02/21 0404        Subjective: And examined at bedside and he is doing okay and had a bowel movement this morning.  Denies any chest pain, shortness breath lightheadedness or dizziness.  Later in afternoon he became more encephalopathic and became more confused and started hallucinating.  Orthostatic vital signs dropped pretty significantly from supine to standing.  We will try an abdominal binder.  He did spike a temperature later this afternoon we will continue monitor for signs and symptoms of infection.  No other concerns or plans at this time.  Objective: Vitals:   01/12/21 0200 01/12/21 0415 01/12/21 0753 01/12/21 1109  BP: 108/70 132/76 (!) 172/97 (!) 159/92  Pulse: 89 90 (!) 103 98  Resp: 20 16 17 20   Temp: 99.3 F (37.4 C) 98.2 F (36.8 C) 98.9 F (37.2 C) 99.4 F (37.4 C)  TempSrc: Oral Oral Oral Oral  SpO2: 96% 100% 100% (!) 79%  Weight:      Height:        Intake/Output Summary (Last 24 hours) at 01/12/2021 1549 Last data filed at 01/12/2021 1015 Gross per 24 hour  Intake 2208.02 ml  Output 1370 ml  Net 838.02 ml   Filed Weights   01/08/21 0409 01/09/21 0558 01/11/21 0414  Weight: 65.5 kg 65.2 kg 63.8 kg   Examination: Physical Exam:  Constitutional: Thin underweight African-American male currently in no acute distress appears calm Eyes: Lids and  conjunctivae normal, sclerae anicteric  ENMT: External Ears, Nose appear normal. Grossly normal hearing.  Neck: Appears normal, supple, no cervical masses, normal ROM, no appreciable thyromegaly; no JVD Respiratory: Diminished to auscultation bilaterally with coarse breath sounds, no wheezing, rales, rhonchi or crackles. Normal respiratory effort and patient is not tachypenic. No accessory muscle use.  Unlabored breathing Cardiovascular: RRR, no murmurs / rubs / gallops. S1 and S2 auscultated.  No appreciable extremity edema Abdomen: Soft, non-tender, non-distended. Bowel sounds positive.  GU: Deferred. Musculoskeletal: No clubbing / cyanosis of digits/nails. No joint deformity upper and  lower extremities.  Skin: No rashes, lesions, ulcers on limited skin evaluation. No induration; Warm and dry.  Neurologic: CN 2-12 grossly intact with no focal deficits. Romberg sign and cerebellar reflexes not assessed.  Psychiatric: Normal judgment and insight. Alert and oriented x 3. Normal mood and appropriate affect.   Data Reviewed: I have personally reviewed following labs and imaging studies  CBC: Recent Labs  Lab 01/08/21 0718 01/09/21 0038 01/10/21 0138 01/11/21 0120 01/12/21 0043  WBC 9.0 8.9 12.1* 11.5* 13.5*  NEUTROABS 5.1 4.9 8.4* 8.0* 9.6*  HGB 9.2* 8.0* 7.9* 8.0* 8.0*  HCT 27.0* 23.6* 23.7* 24.8* 24.9*  MCV 98.5 97.9 99.2 101.2* 102.0*  PLT 361 422* 543* 639* [id number]*   Basic Metabolic Panel: Recent Labs  Lab 01/06/21 0142 01/06/21 1518 01/07/21 0059 01/08/21 0718 01/09/21 0038 01/10/21 0138 01/11/21 0120 01/12/21 0043  NA 131* 133*   < > 128* 132* 131* 129* 128*  K 3.7 3.9   < > 3.7 3.9 4.1 4.6 4.3  CL 100 101   < > 96* 103 103 98 98  CO2 23 24   < > 21* 22 20* 20* 22  GLUCOSE 93 96   < > 90 96 99 74 68*  BUN 7* 7*   < > 5* 11 9 7* 7*  CREATININE 0.79 0.76   < > 0.89 0.86 0.79 0.81 0.88  CALCIUM 8.6* 9.1   < > 8.8* 8.8* 8.9 9.0 9.1  MG 1.7  --    < > 1.6* 2.4 1.8 1.7 1.9   PHOS 3.7 3.8  --  3.2  --   --   --   --    < > = values in this interval not displayed.   GFR: Estimated Creatinine Clearance: 77.5 mL/min (by C-G formula based on SCr of 0.88 mg/dL). Liver Function Tests: Recent Labs  Lab 01/08/21 0718 01/09/21 0038 01/10/21 0138 01/11/21 0120 01/12/21 0043  AST 17 15 16 22 18   ALT 10 9 10 12 10   ALKPHOS 64 63 55 55 52  BILITOT 1.2 0.7 0.7 0.8 0.6  PROT 6.4* 5.9* 5.8* 6.0* 5.9*  ALBUMIN 3.0* 2.6* 2.6* 2.7* 2.6*   No results for input(s): LIPASE, AMYLASE in the last 168 hours. No results for input(s): AMMONIA in the last 168 hours. Coagulation Profile: No results for input(s): INR, PROTIME in the last 168 hours. Cardiac Enzymes: No results for input(s): CKTOTAL, CKMB, CKMBINDEX, TROPONINI in the last 168 hours. BNP (last 3 results) No results for input(s): PROBNP in the last 8760 hours. HbA1C: No results for input(s): HGBA1C in the last 72 hours. CBG: Recent Labs  Lab 01/06/21 1533 01/06/21 1950 01/06/21 2352 01/07/21 0335 01/07/21 0807  GLUCAP 213* 90 88 95 88   Lipid Profile: No results for input(s): CHOL, HDL, LDLCALC, TRIG, CHOLHDL, LDLDIRECT in the last 72 hours. Thyroid Function Tests: No results for input(s): TSH, T4TOTAL, FREET4, T3FREE, THYROIDAB in the last 72 hours. Anemia Panel: No results for input(s): VITAMINB12, FOLATE, FERRITIN, TIBC, IRON, RETICCTPCT in the last 72 hours. Sepsis Labs: Recent Labs  Lab 01/08/21 0718 01/09/21 0038 01/10/21 0138 01/11/21 0120  PROCALCITON <0.10 <0.10 <0.10 <0.10    Recent Results (from the past 240 hour(s))  Culture, blood (routine x 2)     Status: None   Collection Time: 01/04/21 10:00 PM   Specimen: BLOOD RIGHT HAND  Result Value Ref Range Status   Specimen Description BLOOD RIGHT HAND  Final   Special Requests  Final    BOTTLES DRAWN AEROBIC AND ANAEROBIC Blood Culture adequate volume   Culture   Final    NO GROWTH 5 DAYS Performed at [facility], [place] [address] [place], [place] [address]    Report Status 01/09/2021 FINAL  Final  Culture, blood (routine x 2)     Status: None   Collection Time: 01/04/21 10:09 PM   Specimen: BLOOD RIGHT HAND  Result Value Ref Range Status   Specimen Description BLOOD RIGHT HAND  Final   Special Requests   Final    AEROBIC BOTTLE ONLY Blood Culture results may not be optimal due to an inadequate volume of blood received in culture bottles   Culture   Final    NO GROWTH 5 DAYS Performed at [facility], [place] [address], [place], [place] [address]    Report Status 01/09/2021 FINAL  Final  SARS CORONAVIRUS 2 (TAT 6-24 HRS) Nasopharyngeal Nasopharyngeal Swab     Status: None   Collection Time: 01/11/21 10:05 AM   Specimen: Nasopharyngeal Swab  Result Value Ref Range Status   SARS Coronavirus 2 NEGATIVE NEGATIVE Final    Comment: (NOTE) SARS-CoV-2 target nucleic acids are NOT DETECTED.  The SARS-CoV-2 RNA is generally detectable in upper and lower respiratory specimens during the acute phase of infection. Negative results do not preclude SARS-CoV-2 infection, do not rule out co-infections with other pathogens, and should not be used as the sole basis for treatment or other patient management decisions. Negative results must be combined with clinical observations, patient history, and epidemiological information. The expected result is Negative.  Fact Sheet for Patients: SugarRoll.be  Fact Sheet for Healthcare Providers: https://www.woods-mathews.com/  This test is not yet approved or cleared by the [place] FDA and  has been authorized for detection and/or diagnosis of SARS-CoV-2 by FDA under an Emergency Use Authorization (EUA). This EUA will remain  in effect (meaning this test can be used) for the duration of the COVID-19 declaration under Se ction 564(b)(1) of the Act, 21 U.S.C. section 360bbb-3(b)(1), unless the authorization is terminated  or revoked sooner.  Performed at [facility], [place] [address] [place], [place] [address]     RN Pressure Injury Documentation:     Estimated body mass index is 17.58 kg/m as calculated from the following:   Height as of this encounter: 6\' 3"  (1.905 m).   Weight as of this encounter: 63.8 kg.  Malnutrition Type:  Nutrition Problem: Severe Malnutrition Etiology: chronic illness,poor appetite  Malnutrition Characteristics:  Signs/Symptoms: percent weight loss,moderate fat depletion,severe fat depletion,severe muscle depletion,per patient/family report Percent weight loss: 10.6 %  Nutrition Interventions:  Interventions: Boost Breeze,Magic cup,MVI,Boost Plus   Radiology Studies: No results found.  Scheduled Meds: . sodium chloride   Intravenous Once  . amoxicillin-clavulanate  1 tablet Oral Q12H  . aspirin  81 mg Oral Daily  . Chlorhexidine Gluconate Cloth  6 each Topical Daily  . feeding supplement  1 Container Oral TID BM  . ferrous sulfate  325 mg Oral BID WC  . finasteride  5 mg Oral Daily  . fluticasone furoate-vilanterol  1 puff Inhalation Daily  . folic acid  1 mg Oral Daily  . furosemide  40 mg Oral Once  . lactose free nutrition  237 mL Oral TID WC  . levETIRAcetam  500 mg Oral BID   Or  . levETIRAcetam  500 mg Oral BID  . metoprolol tartrate  100 mg Oral BID  .  multivitamin with minerals  1 tablet Oral Daily  . nicotine  21 mg Transdermal Daily  . QUEtiapine  25 mg Oral QHS  . rosuvastatin  10 mg Oral Daily  . sodium chloride flush  10-40 mL Intracatheter Q12H  . thiamine  100 mg Oral Daily  . vitamin B-12  1,000 mcg Oral Daily   Continuous Infusions:   LOS: 10 days   Kerney Elbe, DO [facility] PAGER is on AMION  If 7PM-7AM, please contact night-coverage [email or web address]

## 2021-01-13 ENCOUNTER — Inpatient Hospital Stay (HOSPITAL_COMMUNITY): Payer: Medicare Other

## 2021-01-13 DIAGNOSIS — S065X9A Traumatic subdural hemorrhage with loss of consciousness of unspecified duration, initial encounter: Secondary | ICD-10-CM

## 2021-01-13 DIAGNOSIS — N179 Acute kidney failure, unspecified: Secondary | ICD-10-CM | POA: Diagnosis not present

## 2021-01-13 DIAGNOSIS — Z7289 Other problems related to lifestyle: Secondary | ICD-10-CM | POA: Diagnosis not present

## 2021-01-13 DIAGNOSIS — R4182 Altered mental status, unspecified: Secondary | ICD-10-CM

## 2021-01-13 DIAGNOSIS — I10 Essential (primary) hypertension: Secondary | ICD-10-CM | POA: Diagnosis not present

## 2021-01-13 DIAGNOSIS — R6510 Systemic inflammatory response syndrome (SIRS) of non-infectious origin without acute organ dysfunction: Secondary | ICD-10-CM | POA: Diagnosis not present

## 2021-01-13 DIAGNOSIS — A419 Sepsis, unspecified organism: Secondary | ICD-10-CM | POA: Diagnosis not present

## 2021-01-13 LAB — CBC WITH DIFFERENTIAL/PLATELET
Abs Immature Granulocytes: 0.12 10*3/uL — ABNORMAL HIGH (ref 0.00–0.07)
Basophils Absolute: 0.1 10*3/uL (ref 0.0–0.1)
Basophils Relative: 1 %
Eosinophils Absolute: 0.2 10*3/uL (ref 0.0–0.5)
Eosinophils Relative: 1 %
HCT: 23.4 % — ABNORMAL LOW (ref 39.0–52.0)
Hemoglobin: 7.6 g/dL — ABNORMAL LOW (ref 13.0–17.0)
Immature Granulocytes: 1 %
Lymphocytes Relative: 12 %
Lymphs Abs: 1.7 10*3/uL (ref 0.7–4.0)
MCH: 32.2 pg (ref 26.0–34.0)
MCHC: 32.5 g/dL (ref 30.0–36.0)
MCV: 99.2 fL (ref 80.0–100.0)
Monocytes Absolute: 1.6 10*3/uL — ABNORMAL HIGH (ref 0.1–1.0)
Monocytes Relative: 11 %
Neutro Abs: 10.8 10*3/uL — ABNORMAL HIGH (ref 1.7–7.7)
Neutrophils Relative %: 74 %
Platelets: 839 10*3/uL — ABNORMAL HIGH (ref 150–400)
RBC: 2.36 MIL/uL — ABNORMAL LOW (ref 4.22–5.81)
RDW: 16.4 % — ABNORMAL HIGH (ref 11.5–15.5)
WBC: 14.5 10*3/uL — ABNORMAL HIGH (ref 4.0–10.5)
nRBC: 0 % (ref 0.0–0.2)

## 2021-01-13 LAB — URINALYSIS, ROUTINE W REFLEX MICROSCOPIC
Bilirubin Urine: NEGATIVE
Glucose, UA: NEGATIVE mg/dL
Hgb urine dipstick: NEGATIVE
Ketones, ur: NEGATIVE mg/dL
Leukocytes,Ua: NEGATIVE
Nitrite: NEGATIVE
Protein, ur: NEGATIVE mg/dL
Specific Gravity, Urine: 1.006 (ref 1.005–1.030)
pH: 7 (ref 5.0–8.0)

## 2021-01-13 LAB — COMPREHENSIVE METABOLIC PANEL
ALT: 12 U/L (ref 0–44)
AST: 15 U/L (ref 15–41)
Albumin: 2.5 g/dL — ABNORMAL LOW (ref 3.5–5.0)
Alkaline Phosphatase: 49 U/L (ref 38–126)
Anion gap: 8 (ref 5–15)
BUN: 8 mg/dL (ref 8–23)
CO2: 23 mmol/L (ref 22–32)
Calcium: 9.1 mg/dL (ref 8.9–10.3)
Chloride: 99 mmol/L (ref 98–111)
Creatinine, Ser: 0.94 mg/dL (ref 0.61–1.24)
GFR, Estimated: >60 mL/min (ref >60)
Glucose, Bld: 92 mg/dL (ref 70–99)
Potassium: 4.5 mmol/L (ref 3.5–5.1)
Sodium: 130 mmol/L — ABNORMAL LOW (ref 135–145)
Total Bilirubin: 0.6 mg/dL (ref 0.3–1.2)
Total Protein: 5.7 g/dL — ABNORMAL LOW (ref 6.5–8.1)

## 2021-01-13 LAB — GLUCOSE, CAPILLARY
Glucose-Capillary: 84 mg/dL (ref 70–99)
Glucose-Capillary: 80 mg/dL (ref 70–99)
Glucose-Capillary: 96 mg/dL (ref 70–99)

## 2021-01-13 LAB — BRAIN NATRIURETIC PEPTIDE: B Natriuretic Peptide: 193.7 pg/mL — ABNORMAL HIGH (ref 0.0–100.0)

## 2021-01-13 LAB — MRSA PCR SCREENING: MRSA by PCR: NEGATIVE

## 2021-01-13 LAB — PROCALCITONIN: Procalcitonin: <0.1 ng/mL

## 2021-01-13 LAB — MAGNESIUM: Magnesium: 1.7 mg/dL (ref 1.7–2.4)

## 2021-01-13 IMAGING — CT CT HEAD W/O CM
3 of 4 series · 13 of 47 positions shown, 15 images · non-contrast
Comparison: Prior head CT examinations [DATE] and earlier.
COMPARISON: Prior head CT examinations [DATE] and earlier.

Addendum:
CLINICAL DATA: Mental status change, unknown cause.

EXAM:
CT HEAD WITHOUT CONTRAST
TECHNIQUE: Contiguous axial images were obtained from the base of the skull
through the vertex without intravenous contrast.

[Series 3: head wo · axial · 0.48mm/px · z∈[+1346,+1466]mm · 7 of 34 slices shown, 9 images]
[im 5/34  brain]
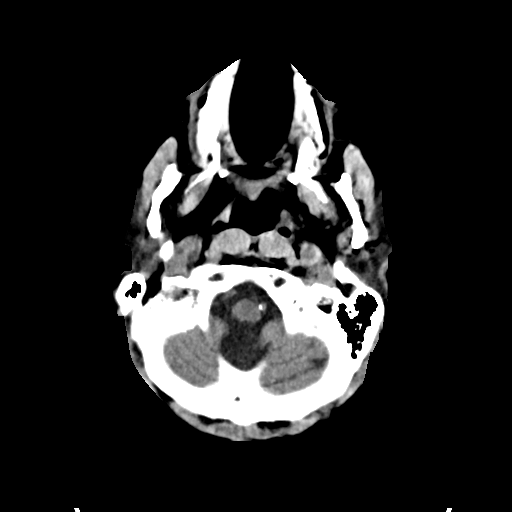
[im 5/34  bone]
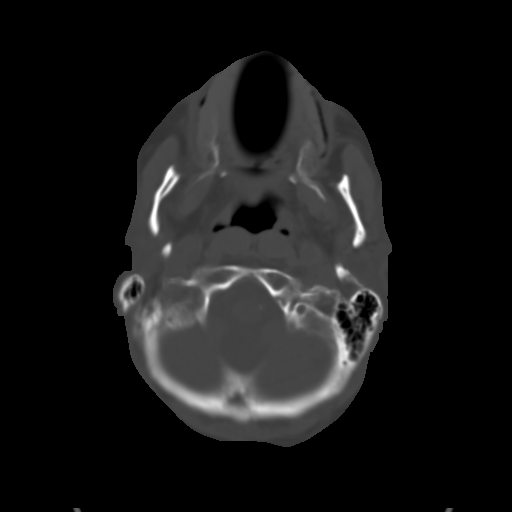
[im 9/34  brain]
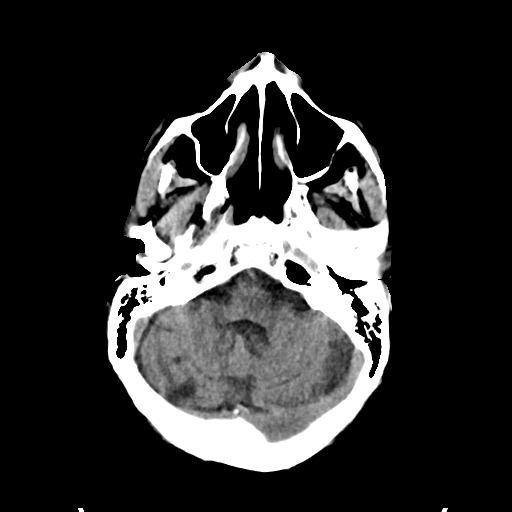
[im 13/34  brain]
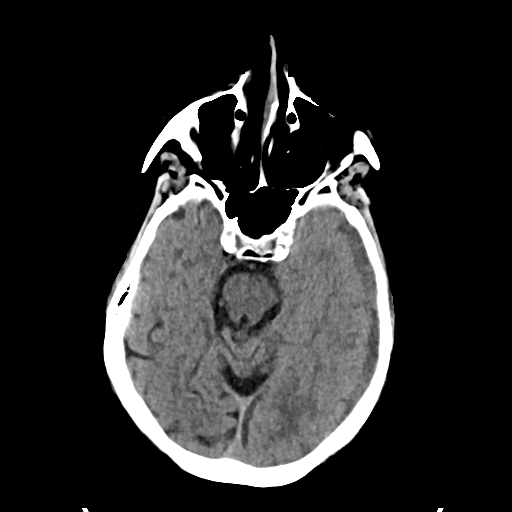
[im 17/34  brain]
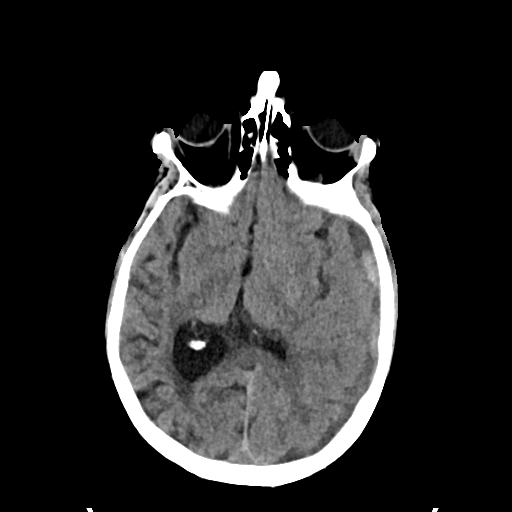
[im 21/34  brain]
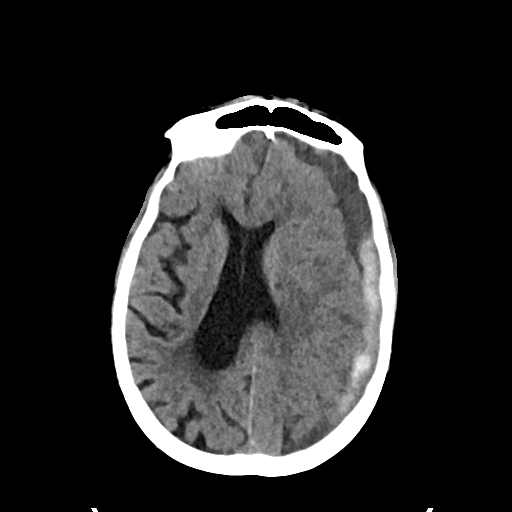
[im 21/34  bone]
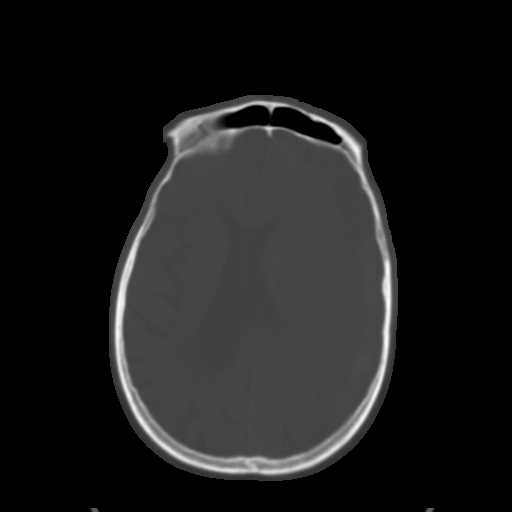
[im 25/34  brain]
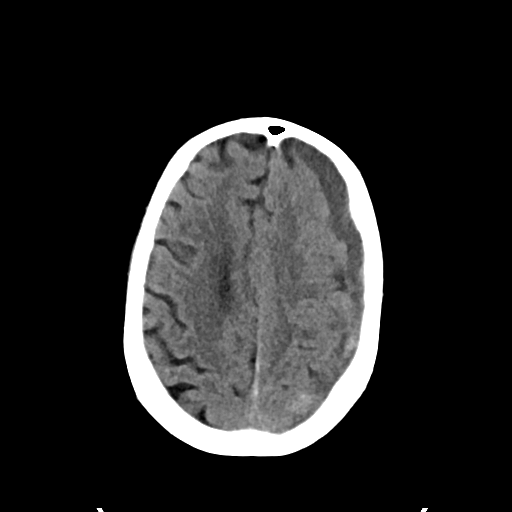
[im 29/34  brain]
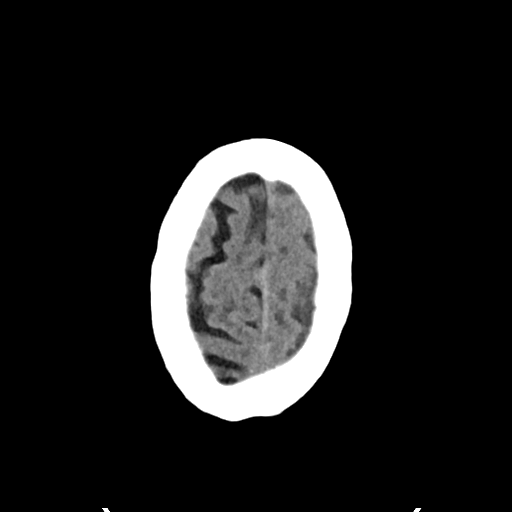

[Series 5: cor soft · coronal · 0.36mm/px · 3 of 75 slices shown]
[im 25/75  brain]
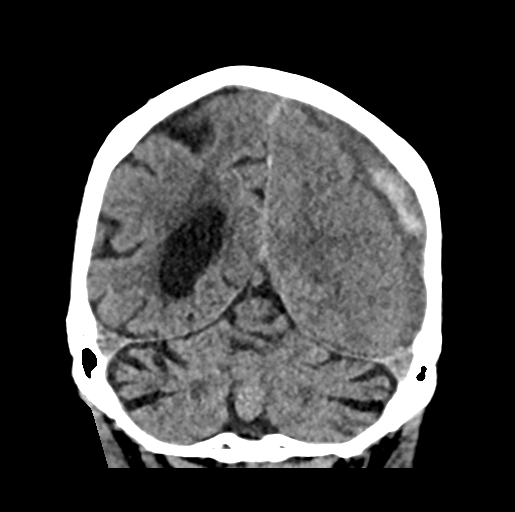
[im 33/75  brain]
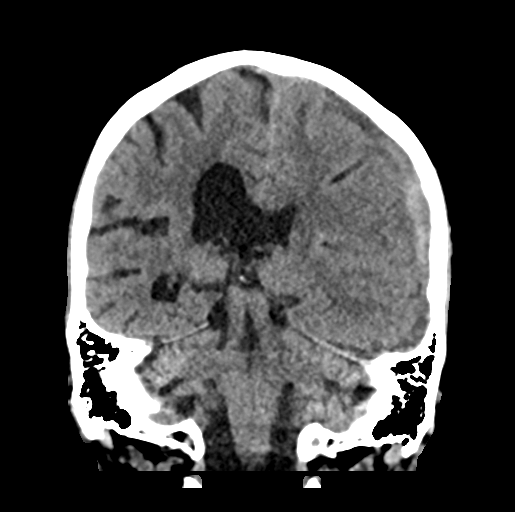
[im 42/75  brain]
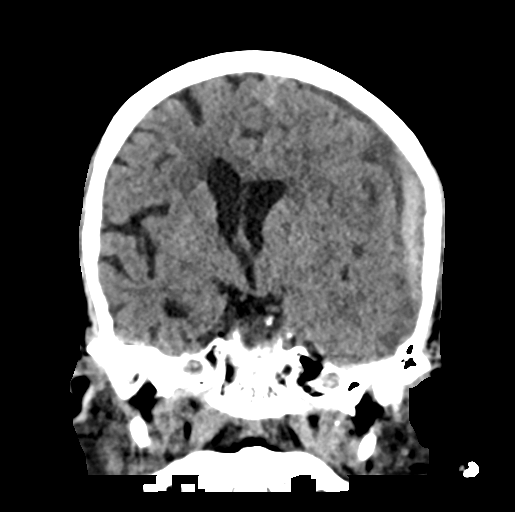

[Series 6: sag soft · sagittal · 0.36mm/px · 3 of 58 slices shown]
[im 20/58  brain]
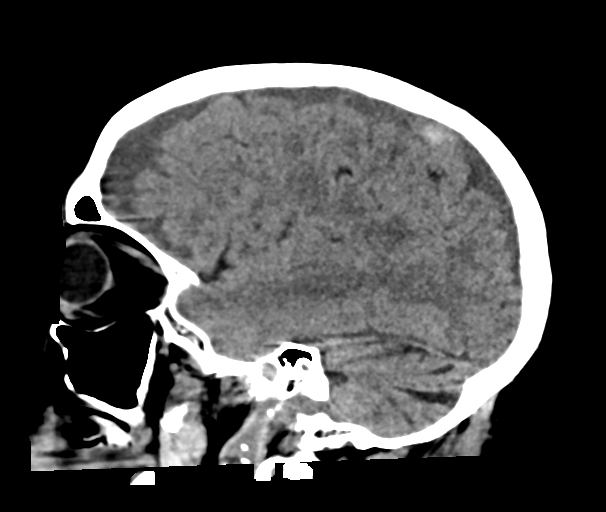
[im 29/58  brain]
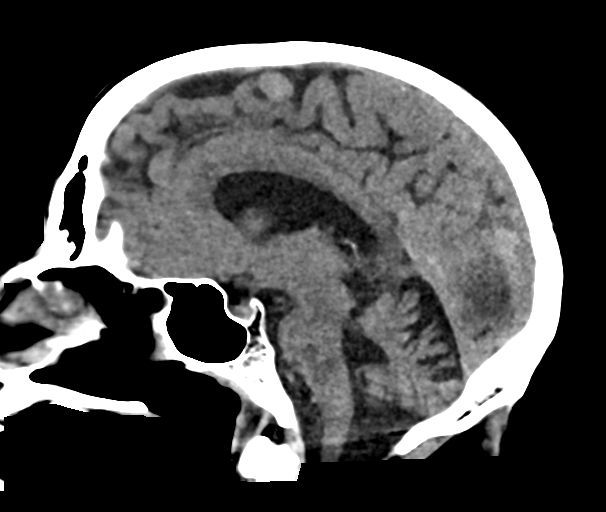
[im 39/58  brain]
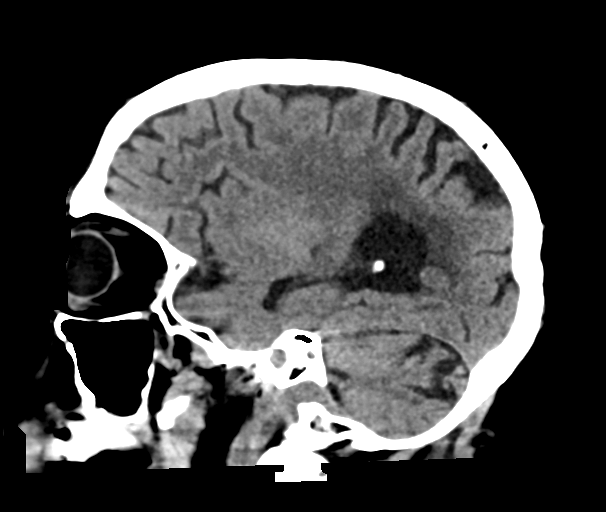

[13 of 47 positions shown; findings below may reference images not displayed]

FINDINGS: Brain:

Mild-to-moderate cerebral and cerebellar atrophy.

Continued interval increase in size of a mixed density subdural
hematoma overlying the left cerebral hemisphere as compared to the
head CT of [DATE]. The hematoma now measures up to 14 mm in
greatest thickness (previously 10 mm). Increased mass effect upon
the left cerebral hemisphere with increased partial effacement of
the left lateral ventricle. Increased midline shift measured at the
level of the septum pellucidum, now 8 mm (previously 4 mm). There is
slight asymmetric effacement of the basal cisterns on the left
(series 3, image 14).

Stable trace parafalcine subdural hemorrhage.

Stable chronic small vessel ischemic disease within the cerebral
white matter. Redemonstrated chronic infarcts within the bilateral
cerebellar hemispheres.

Redemonstrated 9 mm dural-based mass along the right aspect of the
falx, overlying the medial right frontal lobe, likely reflecting an
incidental meningioma (series 5, image 35).

Vascular: These form dilation of the distal cervical left ICA
without sclerotic calcification at this site. The visualized distal
cervical left ICA measures up to 15 mm in diameter. This appears to
have been present on prior examinations dating back this may reflect
a fusiform aneurysm. No hyperdense vessel. Atherosclerotic
calcifications at the base of the brain.

Skull: Normal. Negative for fracture or focal lesion.

Sinuses/Orbits: Visualized orbits show no acute finding. Mild
bilateral frontal and ethmoid sinus mucosal thickening.
Moderate-sized mucous retention cyst versus polypoid mucosal
thickening within the right maxillary sinus
IMPRESSION: Interval increase in size of a mixed density subdural hematoma
overlying the left cerebral hemisphere as compared to [DATE].
The hematoma now measures 14 mm in greatest thickness (previously 10
mm).

Increased mass effect upon the left cerebral hemisphere with
increased partial effacement of the left lateral ventricle.
Increased midline shift, now 8 mm (previously 4 mm). There is now
mild asymmetric effacement of the basal cisterns on the left.

Unchanged trace parafalcine subdural hemorrhage.

Stable background cerebral white matter chronic small vessel
ischemic disease.

Redemonstrated chronic infarcts within bilateral cerebellar
hemispheres.

Unchanged suspected 9 mm meningioma along the right aspect of the
falx.

Fusiform dilation of the partially imaged distal cervical left ICA,
measuring up to 15 mm in diameter. This may reflect a fusiform
aneurysm of the cervical left ICA. Consider non-emergent MR or CT
angiography for further evaluation.

ADDENDUM:
Impressions #1 and #2 called by telephone at the time of
interpretation on [DATE] at [DATE] to provider RUDIJANTO ,
who verbally acknowledged these results.

*** End of Addendum ***
FINDINGS: Brain:

Mild-to-moderate cerebral and cerebellar atrophy.

Continued interval increase in size of a mixed density subdural
hematoma overlying the left cerebral hemisphere as compared to the
head CT of [DATE]. The hematoma now measures up to 14 mm in
greatest thickness (previously 10 mm). Increased mass effect upon
the left cerebral hemisphere with increased partial effacement of
the left lateral ventricle. Increased midline shift measured at the
level of the septum pellucidum, now 8 mm (previously 4 mm). There is
slight asymmetric effacement of the basal cisterns on the left
(series 3, image 14).

Stable trace parafalcine subdural hemorrhage.

Stable chronic small vessel ischemic disease within the cerebral
white matter. Redemonstrated chronic infarcts within the bilateral
cerebellar hemispheres.

Redemonstrated 9 mm dural-based mass along the right aspect of the
falx, overlying the medial right frontal lobe, likely reflecting an
incidental meningioma (series 5, image 35).

Vascular: These form dilation of the distal cervical left ICA
without sclerotic calcification at this site. The visualized distal
cervical left ICA measures up to 15 mm in diameter. This appears to
have been present on prior examinations dating back this may reflect
a fusiform aneurysm. No hyperdense vessel. Atherosclerotic
calcifications at the base of the brain.

Skull: Normal. Negative for fracture or focal lesion.

Sinuses/Orbits: Visualized orbits show no acute finding. Mild
bilateral frontal and ethmoid sinus mucosal thickening.
Moderate-sized mucous retention cyst versus polypoid mucosal
thickening within the right maxillary sinus
IMPRESSION: Interval increase in size of a mixed density subdural hematoma
overlying the left cerebral hemisphere as compared to [DATE].
The hematoma now measures 14 mm in greatest thickness (previously 10
mm).

Increased mass effect upon the left cerebral hemisphere with
increased partial effacement of the left lateral ventricle.
Increased midline shift, now 8 mm (previously 4 mm). There is now
mild asymmetric effacement of the basal cisterns on the left.

Unchanged trace parafalcine subdural hemorrhage.

Stable background cerebral white matter chronic small vessel
ischemic disease.

Redemonstrated chronic infarcts within bilateral cerebellar
hemispheres.

Unchanged suspected 9 mm meningioma along the right aspect of the
falx.

Fusiform dilation of the partially imaged distal cervical left ICA,
measuring up to 15 mm in diameter. This may reflect a fusiform
aneurysm of the cervical left ICA. Consider non-emergent MR or CT
angiography for further evaluation.

## 2021-01-13 IMAGING — DX DG CHEST 1V PORT
1 series · 1 of 1 positions shown · non-contrast
Comparison: [DATE].  [DATE].

CLINICAL DATA: Shortness of breath.

EXAM:
PORTABLE CHEST 1 VIEW

[chest]
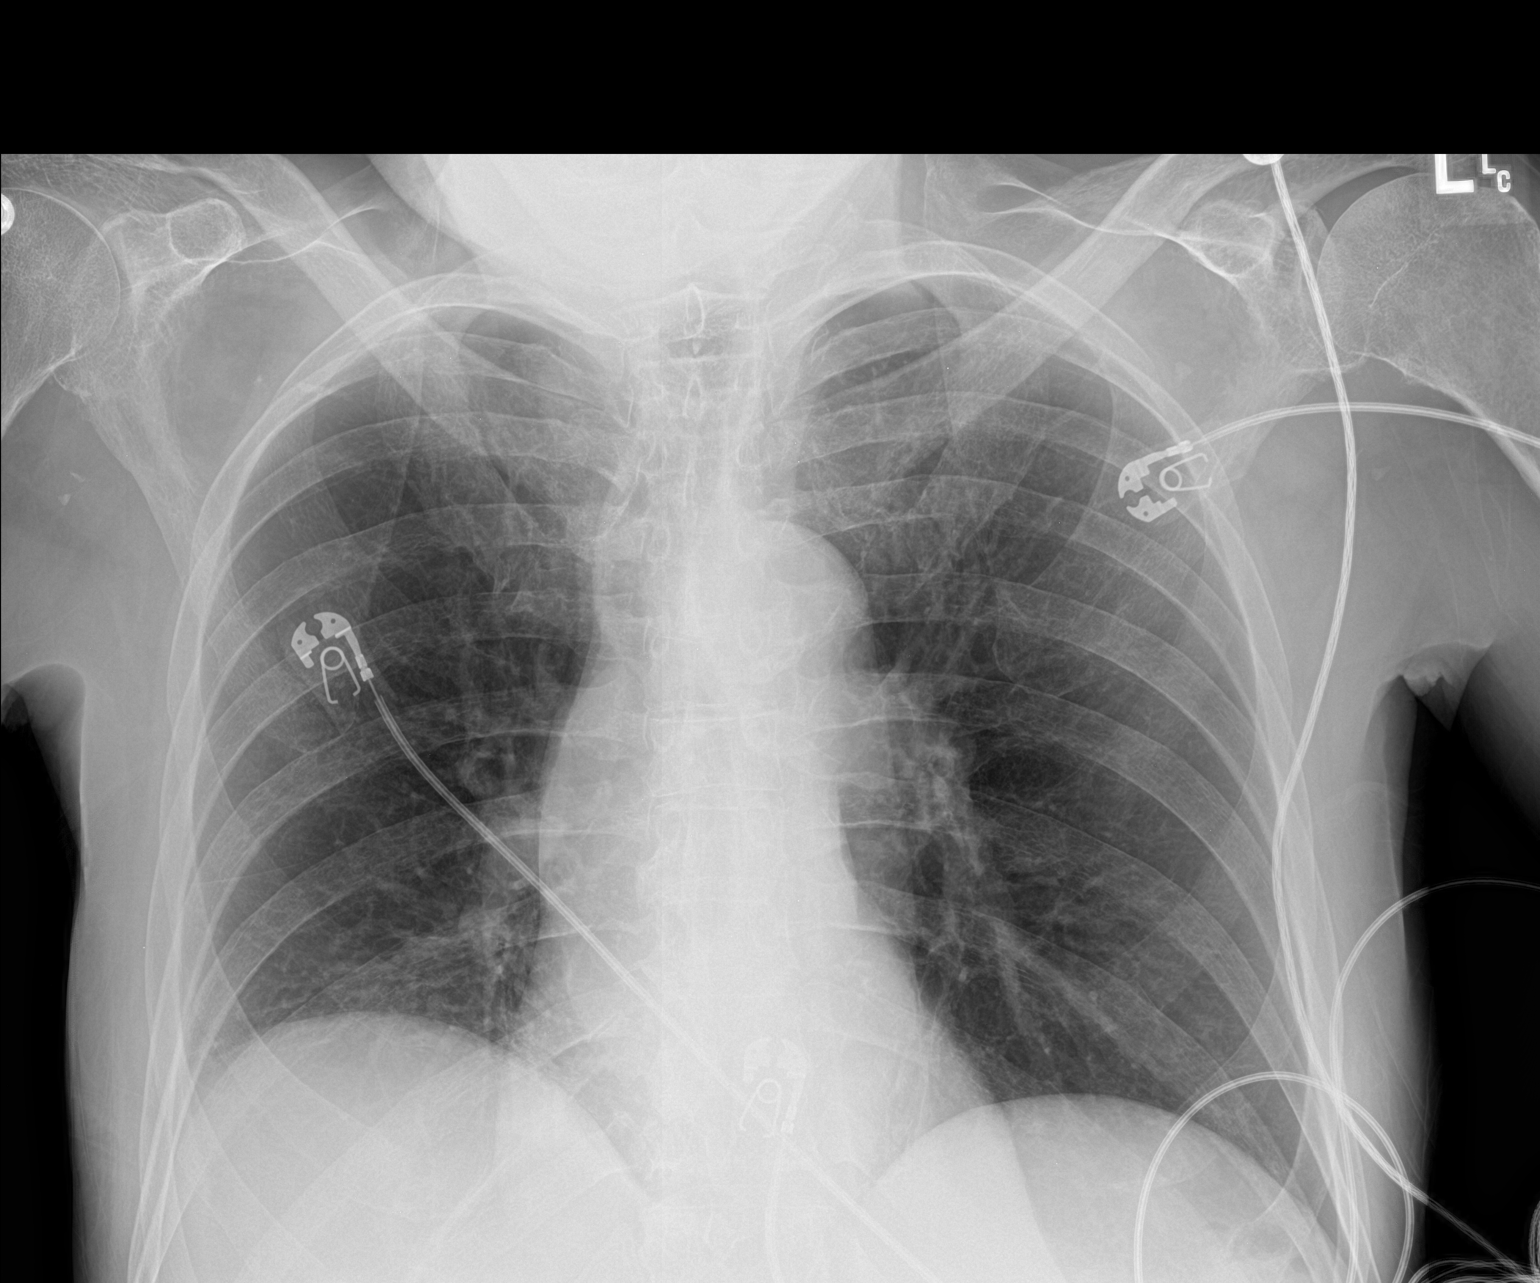

[1 of 1 positions shown; findings below may reference images not displayed]

FINDINGS: Mediastinum and hilar structures normal. Low lung volumes. Mild
right base recurrent atelectatic changes again noted. No prominent
pleural effusion. No pneumothorax. Prior cervical spine fusion.
IMPRESSION: Mild right base recurrent atelectatic changes again noted.

## 2021-01-13 MED ORDER — LORAZEPAM 2 MG/ML IJ SOLN: 2.0000 mg | INTRAMUSCULAR | Status: DC | PRN

## 2021-01-13 MED ORDER — LEVETIRACETAM IN NACL 500 MG/100ML IV SOLN
500.0000 mg | Freq: Two times a day (BID) | INTRAVENOUS | Status: DC
  Administered 2021-01-13 – 2021-01-19 (×13): 500 mg via INTRAVENOUS
  Filled 2021-01-13 (×14): qty 100

## 2021-01-13 MED ORDER — MAGNESIUM SULFATE 2 GM/50ML IV SOLN
2.0000 g | Freq: Once | INTRAVENOUS | Status: AC
  Administered 2021-01-13: 2 g via INTRAVENOUS
  Filled 2021-01-13: qty 50

## 2021-01-13 MED ORDER — LABETALOL HCL 5 MG/ML IV SOLN
10.0000 mg | INTRAVENOUS | Status: DC | PRN
  Administered 2021-01-13 – 2021-01-18 (×3): 10 mg via INTRAVENOUS
  Filled 2021-01-13 (×6): qty 4

## 2021-01-13 NOTE — Progress Notes (Addendum)
LTM EEG hooked up and running - no initial skin breakdown - push button tested - neuro notified. [facility] monitored 

## 2021-01-13 NOTE — Progress Notes (Signed)
PT Cancellation Note  Patient Details Name: Seth [name] MRN: [id number] DOB: 04-27-1956   Cancelled Treatment:    Reason Eval/Treat Not Completed: Medical issues which prohibited therapy per RN, patient with cognitive changes, decorticate rigidity, possible return of DTs. Not medically stable enough for PT today, neuro assessment pending. Holding for now, will follow up once medically ready.    Windell Norfolk, DPT, PN1   Supplemental Physical Therapist [facility]    Pager [phone] Acute Rehab Office [phone]

## 2021-01-13 NOTE — Progress Notes (Addendum)
Subjective: Per RN, patient was awake alert and oriented on Monday.  However yesterday afternoon he appeared more confused, told nurse that there is a baby in his bed and then later in the day thought there was another man in his room.  We also noted a bilateral arms tremor-like movements and at times stiffening in bilateral upper extremities.  ROS: unable to obtain due to poor mental status  Examination  Vital signs in last 24 hours: Temp:  [98.1 F (36.7 C)-100.5 F (38.1 C)] 98.8 F (37.1 C) (05/19 0859) Pulse Rate:  [90-100] 96 (05/19 0859) Resp:  [16-20] 20 (05/19 0859) BP: (133-154)/(78-90) 150/90 (05/19 0859) SpO2:  [98 %-100 %] 100 % (05/19 0859)  General: lying in bed, not in apparent distress CVS: pulse-normal rate and rhythm RS: breathing comfortably, coarse breath sounds bilaterally Extremities: normal, warm Neuro: Awake, alert, able to tell me his name and that he is in [facility], not oriented to time or that he is in the hospital, able to name 2 objects after asking repeatedly, 4/5 in right upper extremity, 5/5 left upper extremity, antigravity strength in bilateral lower extremities (did not cooperate for more strength testing)  Basic Metabolic Panel: Recent Labs  Lab 01/06/21 1518 01/07/21 0059 01/08/21 0718 01/09/21 0038 01/10/21 0138 01/11/21 0120 01/12/21 0043 01/12/21 1649 01/13/21 0143  NA 133*   < > 128*   < > 131* 129* 128* 131* 130*  K 3.9   < > 3.7   < > 4.1 4.6 4.3 4.4 4.5  CL 101   < > 96*   < > 103 98 98 98 99  CO2 24   < > 21*   < > 20* 20* 22 22 23   GLUCOSE 96   < > 90   < > 99 74 68* 82 92  BUN 7*   < > 5*   < > 9 7* 7* 9 8  CREATININE 0.76   < > 0.89   < > 0.79 0.81 0.88 0.81 0.94  CALCIUM 9.1   < > 8.8*   < > 8.9 9.0 9.1 9.3 9.1  MG  --    < > 1.6*   < > 1.8 1.7 1.9 1.8 1.7  PHOS 3.8  --  3.2  --   --   --   --  4.0  --    < > = values in this interval not displayed.    CBC: Recent Labs  Lab 01/09/21 0038  01/10/21 0138 01/11/21 0120 01/12/21 0043 01/12/21 1649 01/13/21 0143  WBC 8.9 12.1* 11.5* 13.5* 14.9* 14.5*  NEUTROABS 4.9 8.4* 8.0* 9.6*  --  10.8*  HGB 8.0* 7.9* 8.0* 8.0* 8.1* 7.6*  HCT 23.6* 23.7* 24.8* 24.9* 25.2* 23.4*  MCV 97.9 99.2 101.2* 102.0* 101.2* 99.2  PLT 422* 543* 639* 783* 885* 839*     Coagulation Studies: No results for input(s): LABPROT, INR in the last 72 hours.  Imaging No new brain imaging overnight  ASSESSMENT AND PLAN: 65 year old male with alcohol use who presented with altered mental status and was found to have left subdural hematoma which did not require any neurosurgical intervention. Had generalized tonic-clonic seizure-like episode this afternoon.  Acute subdural hematoma with right upper extremity hemiparesis Acute symptomatic seizure Acute encephalopathy, worsening Visual hallucinations Alcohol use Thrombocytosis Anemia Leukocytosis Hypoproteinemia with hypoalbuminemia -Differentials for encephalopathy include worsening subdural hematoma, seizures, infection, alcohol withdrawal, thiamine deficiency  Recommendations: -Will obtain CT head without contrast to look for any  worsening and subdural hematoma -We will obtain video EEG to look for intermittent seizures -Dr. Shelton Silvas already ordered infectious work-up including urine and blood cultures -Continue Keppra 500 mg twice daily.  If no seizures on EEG, will likely reduce to 250 mg twice daily -Seizure precautions including do not drive -as needed IV Ativan 2 mg for clinical seizure-like activity lasting more than 2 minutes  ADDENDUM - CT head 01/13/2021 showed Interval increase in size of a mixed density subdural hematoma overlying the left cerebral hemisphere as compared to 01/06/2021. The hematoma now measures 14 mm in greatest thickness (previously 39mm).  Increased mass effect upon the left cerebral hemisphere with increased partial effacement of the left lateral ventricle. Increased  midline shift, now 8 mm (previously 4 mm). There is now mild asymmetric effacement of the basal cisterns on the left. Unchanged trace parafalcine subdural hemorrhage. - Will contact neurosurgery for further management  I have spent a total of 35  minutes with the patient reviewing hospital notes,  test results, labs and examining the patient as well as establishing an assessment and plan.  > 50% of time was spent in direct patient care.   Zeb Comfort Epilepsy [facility] For questions after 5pm please refer to AMION to reach the Neurologist on call

## 2021-01-13 NOTE — Progress Notes (Signed)
OT Cancellation Note  Patient Details Name: Seth [name] MRN: [id number] DOB: 24-Dec-1955   Cancelled Treatment:    Reason Eval/Treat Not Completed: Patient not medically ready RN requesting to hold therapy this date due to cognitive changes, decorticate rigidity and possible return of DTs. Neuro assessment pending. Will hold OT at this time and return as time allows and pt is appropriate  [facility] OTR/L Acute Rehabilitation Services Office: [place] 01/13/2021, 10:14 AM

## 2021-01-13 NOTE — Progress Notes (Signed)
  NEUROSURGERY PROGRESS NOTE   Received call from Dr Chana Bode regarding patient.  65 year old male who was admitted for AMS on 5/7. He was previously followed by Dr Saintclair Halsted after a head CT revealed a left cerebral convexity SDH. Patient was on Plavix which was discontinued after ICH was identified. A follow up CT revealed stability of the SDH. Today we were called after patient was noted to be altered and a repeat head CT was obtained revealing enlargement of the SDH with worsening MLS. I immediately came to the bedside.  Per nursing, patient was doing well Monday and Tuesday this. A&O, following commands. Yesterday around 1630, nursing noted patient to be confused - thinking there was a baby in the room. At some point between yesterday evening and today, patient became further altered with decreased LOC. A head CT was obtained.  EXAM:  BP (!) 144/96 (BP Location: Right Arm)   Pulse 88   Temp 99.2 F (37.3 C) (Axillary)   Resp 17   Ht 6\' 3"  (1.905 m)   Wt 63.8 kg   SpO2 100%   BMI 17.58 kg/m   Eyes closed Opens eye to command. Upward gaze PERRL Able to same name, otherwise just nods head to everything with incomprehensible speech Follows commands with all extremities with extensive encouragement. L>R LUE tremor. RUE paresis.  IMPRESSION/PLAN 65 y.o. male with worsening mental status found to have enlargement of left cerebral convexity SDH with slight worsening MLS. While SDH is larger, it is not to the point where an emergent surgery is indicated. Would rec EEG to r/o status prior to deciding on crani for evacuation.

## 2021-01-13 NOTE — Progress Notes (Signed)
Assisted with transport to ICU ([id number])  ICU staff at bedside to receive patient

## 2021-01-13 NOTE — Progress Notes (Signed)
[place] Progress Note Patient Name: WESTLEY BLASS DOB: Aug 10, 1956 MRN: [id number]   Date of Service  01/13/2021  HPI/Events of Note  Patient has been made NPO and left on multiple oral medications. Nursing not comfortable with PO medications given current LOC.  eICU Interventions  Plan: 1. D/C Keppra PO.  2. Labetalol 10 mg IV Q 2 hours PRN SBP > 160 or DBP > 100.     Intervention Category Major Interventions: Other:  Lysle Dingwall 01/13/2021, 9:29 PM

## 2021-01-13 NOTE — Progress Notes (Addendum)
NAME:  Seth [name], MRN:  [id number], DOB:  11-Oct-1955, LOS: 33 ADMISSION DATE:  01/01/2021, CONSULTATION DATE:  01/06/2021 REFERRING MD:  Dr. Candiss Norse, CHIEF COMPLAINT:  Seizure followed by brief VT arrest    History of Present Illness:  Seth [name] is a 65 y.o. male with PMH significant for prior stroke, HTN. ETOH abuse, PVD s/p DES to popliteal artery, left foor osteomyelitis, GOU, COPD, tobacco use, and depression who presented with for evaluation of AMS. On visit from family patient was seen with AMS prompting EMS call. Per chart review patient did report he passed out the day before ED arrival which was followed by vomiting.   On ED evaluation patient was seen with hypertension, tachycardia, and mildly elevated temp of 99.3. Head CT revealed left sided SDH with 33mm midline shift. Patient was admitted per [facility] with NSG consult.   Afternoon of 5/12 patient was seen with acute neuro change and later seen with full tonic clonic seizure. HE was administered IV ativan and once seizure activity stopped patient was take emergently to radiology for stat repeat head CT. En route to CT patient was seen with decreased mentation yet again and subsequent development of VT with brief (20-30sec) cardiac arrest. VT spontaneously resolved and patient regained pulses without any chest compressions or medications.   On 5/19 PCCM re-engaged as pt with worsening mental status, worsening subdural hemorrhage and is being transferred to the ICU   Pertinent  Medical History  Prior stroke, HTN. ETOH abuse, PVD s/p DES to popliteal artery, left foor osteomyelitis, GOU, COPD, tobacco use, and depression  Significant Hospital Events: Including procedures, antibiotic start and stop dates in addition to other pertinent events   . 5/7 Admitted for AMS  . 5/12 seizure followed by brief (20-30sec) cardiac arrest. VT spontaneously resolved and patient regained pulses without any chest compressions or medications.  . 5/18  worsening mental status noted by RN-- hallucinations, tremor, increased encephalopathy and somnolence. Put on CIWA protocol. . 5/19 worsening mental status -- repeat CT H acquired revealing increased subdural hematoma, increased mass effect, effacement of basal cisterns on L. Got 2mg  ativan for tremor vs generalized tonic clonic sz. Transferring to the ICU. Neurology, [place] and PCCM called   Interim History / Subjective:  Worsening mental status with increased SDH and midline shift  Moving to the ICU, PCCM asked to take over   Objective   Blood pressure (!) 144/96, pulse 88, temperature 99.2 F (37.3 C), temperature source Axillary, resp. rate 17, height 6\' 3"  (1.905 m), weight 63.8 kg, SpO2 100 %.        Intake/Output Summary (Last 24 hours) at 01/13/2021 1525 Last data filed at 01/13/2021 0834 Gross per 24 hour  Intake 406.25 ml  Output 2340 ml  Net -1933.75 ml   Filed Weights   01/08/21 0409 01/09/21 0558 01/11/21 0414  Weight: 65.5 kg 65.2 kg 63.8 kg    Examination: General: Acutely and chronically ill appearing older adult M reclined in bed NAD  HEENT: [place]/AT, MM pink/moist, PERRL,  Neuro: Drowsy. Awakens to command. Following commands variably.Tremor LUE > RUE. Increased muscle tone RUE, masseter muscles. CV: rr 1+ radial pulses s1s2 no rgm PULM: CTA. Symmetrical chest expansion, even and unlabored  GI: Soft thin ndnt  Extremities: no acute deformity, no cyanosis or clubbing  Skin: scattered ecchymosis. C/d/w   Labs/imaging that I have personally reviewed    CT H 5/19> increased side L SDH (54mm). Increased mass effect, increased parital effacement  of L lateral ventricle, Increased midline shift (5mm), some asymmetric effacement of L basal cisterns. Stable trace parafalcine Subdural hemorrhage. Chronic white matter small vessel disease. Chronic bilateral cerebellar infarcts. 62mm suspected meningioma R aspect of falx. 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 Chronic changes.   Resolved Hospital Problem list    VT arrest (brief arrest 5/12 x 20-30seconds) Assessment & Plan:   Acute Left SDH with mass effect and midline shift , worsening -5/19 incr size SDH to 43mm from 12mm, and incr midline shift 13mm from 34mm, incr mass effect  Seizure -reported throughout hospital course -- most recently noted by neuro note 5/19 Acute Encephalopathy-- worse 5/19 -in setting of above vs metabolic  -mental status reportedly started to decline 5/18 approx 1700-- no imaging at that time, thought possible EtOH w/d. Continued to decline 5/19, neuro consulted by primary and sent for CT H which revealed worsening intracranial process as above  P; -Transfer to ICU -currently protecting airway, continue to monitor  -NSGY to re-eval for possible operable intervention -Neuro following-- cEEG, BD Keppra, PRN ativan  -seizure precautions  -frequent neuro checks -cont to hold vte ppx, no AC  -will dc qHs seroquel and trazodone   Hx COPD Suspected aspiration PNA  P: -breo, albuterol  -augmentin changed to zosyn 5/19   Possible sepsis -TMax 100.4, WBC 14.9 (incr from 11.5) -could be infectious vs related to blood in the brain.  -has actually been on abx since 5/9 P -BCx and UCx sent by [facility] -- follow, but UA not really concerning  -can cont abx right now -will send PCT  Hyponatremia -has been low 130s to high 120s since 5/12 P -cont to trend. Doubt this would explain worse mental status 5/18&5/19    Hx EtOH abuse -Patient reports I1/2 -1 pint of vodka per day -has been admitted since 5/7 so probably outside of CIWA window by 5/19 P: -dc ciwa.   Hx CVA  P Continue ASA and Statin  Supportive care  Home Plavix on hold  Anemia -7.6 from 8.1 on 5/19 with incr SDH  P: -Hgb goal >7  Hx PAD s/p DES to popliteal artery Hx L transmetatarsal amputation,  osteomyelitis -Home medications include DAPT therapy with statin and Plavix P: -Holding DAPT  -Continue statin   Best practice (right click and "Reselect all SmartList Selections" daily)  Diet:  NPO Pain/Anxiety/Delirium protocol (if indicated): No VAP protocol (if indicated): Not indicated DVT prophylaxis: SCD GI prophylaxis: PPI Glucose control:  SSI No Central venous access:  N/A Arterial line:  N/A Foley:  N/A Mobility:  bed rest  PT consulted: N/A Last date of multidisciplinary goals of care discussion Pending  Code Status:  full code Disposition: ICU  Labs   CBC: Recent Labs  Lab 01/09/21 0038 01/10/21 0138 01/11/21 0120 01/12/21 0043 01/12/21 1649 01/13/21 0143  WBC 8.9 12.1* 11.5* 13.5* 14.9* 14.5*  NEUTROABS 4.9 8.4* 8.0* 9.6*  --  10.8*  HGB 8.0* 7.9* 8.0* 8.0* 8.1* 7.6*  HCT 23.6* 23.7* 24.8* 24.9* 25.2* 23.4*  MCV 97.9 99.2 101.2* 102.0* 101.2* 99.2  PLT 422* 543* 639* 783* 885* 839*    Basic Metabolic Panel: Recent Labs  Lab 01/08/21 0718 01/09/21 0038 01/10/21 0138 01/11/21 0120 01/12/21 0043 01/12/21 1649 01/13/21 0143  NA 128*   < > 131* 129* 128* 131* 130*  K 3.7   < >  4.1 4.6 4.3 4.4 4.5  CL 96*   < > 103 98 98 98 99  CO2 21*   < > 20* 20* 22 22 23   GLUCOSE 90   < > 99 74 68* 82 92  BUN 5*   < > 9 7* 7* 9 8  CREATININE 0.89   < > 0.79 0.81 0.88 0.81 0.94  CALCIUM 8.8*   < > 8.9 9.0 9.1 9.3 9.1  MG 1.6*   < > 1.8 1.7 1.9 1.8 1.7  PHOS 3.2  --   --   --   --  4.0  --    < > = values in this interval not displayed.   GFR: Estimated Creatinine Clearance: 72.6 mL/min (by C-G formula based on SCr of 0.94 mg/dL). Recent Labs  Lab 01/08/21 0718 01/09/21 0038 01/10/21 0138 01/11/21 0120 01/12/21 0043 01/12/21 1649 01/13/21 0143  PROCALCITON <0.10 <0.10 <0.10 <0.10  --   --   --   WBC 9.0 8.9 12.1* 11.5* 13.5* 14.9* 14.5*    Liver Function Tests: Recent Labs  Lab 01/10/21 0138 01/11/21 0120 01/12/21 0043 01/12/21 1649  01/13/21 0143  AST 16 22 18 20 15   ALT 10 12 10 12 12   ALKPHOS 55 55 52 53 49  BILITOT 0.7 0.8 0.6 0.5 0.6  PROT 5.8* 6.0* 5.9* 6.3* 5.7*  ALBUMIN 2.6* 2.7* 2.6* 2.8* 2.5*   No results for input(s): LIPASE, AMYLASE in the last 168 hours. No results for input(s): AMMONIA in the last 168 hours.  ABG    Component Value Date/Time   PHART 7.475 (H) 07/20/2020 0240   PCO2ART 20.6 (L) 07/20/2020 0240   PO2ART 95 07/20/2020 0240   HCO3 15.1 (L) 07/20/2020 0240   TCO2 16 (L) 07/20/2020 0240   ACIDBASEDEF 7.0 (H) 07/20/2020 0240   O2SAT 98.0 07/20/2020 0240     Coagulation Profile: No results for input(s): INR, PROTIME in the last 168 hours.  Cardiac Enzymes: No results for input(s): CKTOTAL, CKMB, CKMBINDEX, TROPONINI in the last 168 hours.  HbA1C: Hgb A1c MFr Bld  Date/Time Value Ref Range Status  07/20/2020 07:41 AM 4.2 (L) 4.8 - 5.6 % Final    Comment:    (NOTE) Pre diabetes:          5.7%-6.4%  Diabetes:              >6.4%  Glycemic control for   <7.0% adults with diabetes     CBG: Recent Labs  Lab 01/06/21 1533 01/06/21 1950 01/06/21 2352 01/07/21 0335 01/07/21 0807  GLUCAP 213* 90 88 95 88    CRITICAL CARE Performed by: Cristal Generous   Total critical care time: 64 minutes  Critical care time was exclusive of separately billable procedures and treating other patients. Critical care was necessary to treat or prevent imminent or life-threatening deterioration.  Critical care was time spent personally by me on the following activities: development of treatment plan with patient and/or surrogate as well as nursing, discussions with consultants, evaluation of patient's response to treatment, examination of patient, obtaining history from patient or surrogate, ordering and performing treatments and interventions, ordering and review of laboratory studies, ordering and review of radiographic studies, pulse oximetry and re-evaluation of patient's  condition.  Eliseo Gum MSN, AGACNP-BC [place] for pager details 01/13/2021, 3:25 PM

## 2021-01-13 NOTE — Progress Notes (Signed)
Nutrition Follow-up  DOCUMENTATION CODES:   Underweight,Severe malnutrition in context of chronic illness  INTERVENTION:   -D/c Boost Breeze -Continue Boost Plus TID -Continue Magic cup BID with meals, each supplement provides 290 kcal and 9 grams of protein -Continue MVI with minerals daily  NUTRITION DIAGNOSIS:   Severe Malnutrition related to chronic illness,poor appetite as evidenced by percent weight loss,moderate fat depletion,severe fat depletion,severe muscle depletion,per patient/family report.  Ongoing  GOAL:   Patient will meet greater than or equal to 90% of their needs  Progressing   MONITOR:   PO intake,Supplement acceptance,Labs,Weight trends,I & O's  REASON FOR ASSESSMENT:   Malnutrition Screening Tool    ASSESSMENT:   Pt admitted with AMS, dehydration, hypoglycemia, and SIRS and later found to have SDH. PMH includes CVA, PAD s/p LLE angiography with DES placed to popliteal artery, L foot osteomyelitis s/p transmetatarsal amputation, HTN, COPD, EtOH and tobacco use.  5/13- s/p BSE- dysphagia 2 diet with thin liquids  Reviewed I/O's: -2.3 L x 24 hours and -8.6 L since admission  UOP: 3 L x 24 hours  Pt sleeping soundly at time of visit. He did not arouse to name being called.   Pt with good appetite. Noted meal completion 50-100%. Pt consuming Boost Plus supplements, but refusing Boost Breeze.   Medications reviewed and include ferrous sulfate, lasix, ativan, keppra, vitamin B-12 and thiamine.   Labs reviewed: Na: 130.  Diet Order:   Diet Order            DIET DYS 2 Room service appropriate? Yes; Fluid consistency: Thin  Diet effective now                 EDUCATION NEEDS:   Education needs have been addressed  Skin:  Skin Assessment: Skin Integrity Issues: Skin Integrity Issues:: Other (Comment) Other: skin tear to lt shoulder  Last BM:  01/12/21  Height:   Ht Readings from Last 1 Encounters:  01/02/21 6\' 3"  (1.905 m)     Weight:   Wt Readings from Last 1 Encounters:  01/11/21 63.8 kg   BMI:  Body mass index is 17.58 kg/m.  Estimated Nutritional Needs:   Kcal:  2000-2200  Protein:  100-115 grams  Fluid:  >2L/d    Loistine Chance, RD, LDN, [place] Registered Dietitian II Certified Diabetes Care and Education Specialist Please refer to AMION for RD and/or RD on-call/weekend/after hours pager

## 2021-01-13 NOTE — Progress Notes (Signed)
PROGRESS NOTE    Seth [name]  [id number] DOB: 05-26-1956 DOA: 01/01/2021 PCP: Lucianne Lei, MD   Brief Narrative:  Patient is a 65 year old African-American male with a past medical history significant for but not limited to history of CVA, history of PVD status post left lower extremity angiography with DES placed to the popliteal artery, history of left foot osteomyelitis status post transmetatarsal amputation on 09/02/2020, history of COPD, hypertension, alcohol abuse and tobacco use presented to the ED for altered mental status.  In the ED his work-up was most consistent with dehydration, hypoglycemia and SDH.  His hospital course was complicated by the development of aspiration pneumonia and febrile seizures.  Patient stayed in ICU for a day and continues to spike temperatures now.  We have escalated his antibiotics and reculture the patient.  He is also hallucinating more and so we have resumed his CIWA score and I have asked neurology to weigh in again.  Neurology initially recommended high-dose vitamin B-1 so this was started yesterday.  This morning he was more somnolent and drowsy and much more altered compared to yesterday and nursing states that he started having some posturing.  Case was discussed with neurology and they recommended getting a head CT without contrast as well as a video EEG to look for intermittent seizures.  If no seizures seen on EEG neurology is going to reduce his Keppra to 250 mg daily.  Head CT done however showed increased interval increase in size of a mixed density subdural hematoma over I&D left cerebral hemisphere as compared to 01/06/2021.  The hematoma was now measuring 40 mm in greatest thickness previously as it was 10 mm.  There is also increased mass-effect upon the left cerebral hemisphere with increased partial effacement of the left lateral ventricle and increased midline shift now 8 mm which was previously 4 mm.  There is also mild asymmetric effacement of  the basal cistern of the left.  There is unchanged trace parafalcine subdural hemorrhage noted still.  Case was discussed with neurosurgery who recommended stat transfer to the neuro intensive care unit.  Critical care was consulted for further evaluation.  Assessment & Plan:   Principal Problem:   SIRS (systemic inflammatory response syndrome) (HCC) Active Problems:   COPD (chronic obstructive pulmonary disease) (HCC)   Hyperkalemia   AKI (acute kidney injury) ([facility])   Hypoglycemia   Alcohol use   History of CVA (cerebrovascular accident)   VT (ventricular tachycardia) (HCC)   Benign essential HTN   Benign prostatic hyperplasia   Depression   ETOH abuse   Hyponatremia   Protein-calorie malnutrition, severe  Acute Decline in Mental Status, worsening now with some posturing and some hallucinations -Likely on by alcohol abuse, poor oral intake causing hypoglycemia, hypomagnesemia, dehydration, AKI and hyperkalemia, with possible DTs.  -CT Scan of Head showing SDH and latest CT scan showed on 01/06/21 "No change since yesterday. Left convexity subdural is no larger. Mass-effect is the same. Small amount of blood along the falx is the same." -Because of his worsening mental status neurology was reconsulted -Repeat head CT scan done today showed "Interval increase in size of a mixed density subdural hematoma overlying the left cerebral hemisphere as compared to 01/06/2021. The hematoma now measures 14 mm in greatest thickness (previously 10 mm).  Increased mass effect upon the left cerebral hemisphere with increased partial effacement of the left lateral ventricle. Increased midline shift, now 8 mm (previously 4 mm). There is now mild asymmetric effacement of  the basal cisterns on the left.  Unchanged trace parafalcine subdural hemorrhage.  Stable background cerebral white matter chronic small vessel ischemic disease.  Redemonstrated chronic infarcts within bilateral  cerebellar hemispheres.  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  Fusiform dilation of the partially imaged distal cervical left ICA, measuring up to 15 mm in diameter. 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 Consider non-emergent MR or CT angiography for further evaluation."  -He has been extensively counseled to quit alcohol intake and was given hydration with IV fluids, -Patient started to have some tremors again -For SDH he was seen by neurosurgeon Dr. Saintclair Halsted, multiple scans were stable until today's Scan -Patient continues to hallucinate and was febrile to 100.5 yesterday; will repeat blood cultures and escalate antibiotics back to IV Zosyn.  We will also obtain urinalysis and urine culture -Plavix for now has been discontinued, low-dose aspirin upon discharge.  -Because he decompensated will go to the Neuro-ICU with Neurosurgery to Consult and PCCM to evaluate  Aspiration. PNA due to DTs  but now with new fever and worsening leukocytosis -Chest x-ray from 01/10/21 showed "Decreased opacities in the right lung base, likely improved atelectasis. No new focal airspace disease." -C/w Guaifenesin 600 mg po BIDprn  -C/w Breo-Ellipta 1 puff IH Daily and with Albuterol 2 puff IH q6hprn Wheezing  -Patient's WBC was trending the wrong way and had gone from 11.5 -> 14.9 -> 14.5 today and he is spiked a temperature of 100.5 and became tachycardic yesterday  -We will bolus 250 mL and resume IV fluids with LR at 100 ml/hr -Repeat CXR showed "Mild right base recurrent atelectatic changes again noted." -Currently on Augmentin and this was continued but given his spiking temperature of 100.5 have escalated back to IV Zosyn and obtain blood cultures x2 and a urinalysis and urine culture, speech therapy following on dysphagia 2 diet, patient was to stop Augmentin on 01/13/2021 however will continue IV Zosyn for now -Will repeat CXR in the AM and  follow-up on blood cultures x2 and urine culture and urinalysis has been ordered yesterday  -Continue to monitor temperature curve and WBC trend  History of CVA.   -Supportive care continue ASA 81 mg po Daily and with Rosuvastatin 10 mg po Daily but may need to stop ASA given worsening SDH  Seizures likely due to fever from aspiration pneumonia on 01/05/2021 in the setting of SDH.   -Appreciate neurology input -EEG nonacute -On Levitiracetam 500 mg po BID and will continue -Given his encephalopathy and hallucinations we have asked neurology to reevaluate and Dr. Hortense Ramal will see the patient in the morning and she ordered a repeat Head CT and recommended video EEG Monitoring   History of PAD s/p left lower extremity angiography with DES placement to popliteal artery, s/p left transmetatarsal amputation in the past.   -C/w Supportive care. Was on DAPT with statin.   -Case discussed with his vascular surgeon Dr. Donzetta Matters on 04/2021, continue Aspirin 81 mg po Daily and Rosuvastatin for now.   -Can hold Plavix indefinitely if needed for SDH.  Alcohol Abuse with Hallucinations -Counseled to quit  -Last drink on 12/27/2020 ?,  He was slightly more confused on 01/03/2021 and could have been going into early DTs, -We will also start high-dose vitamin B-1 500 mg every 8 hours scheduled -Now has finished CIWA protocol but will resume given his hallucinations.  Continue with PRN Haldol now. -Last CIWA Score was 11  yesterday -He continues to Hallucinate and now worsening   COPD -Stable and currently not on Supplemental O2 -Tx as above   Tobacco Abuse -Smoking Cessastion Counseling given -C/w Nicotine Patch 21 mg TD q24h  Hypokalemia -Improved -Continue to Monitor and Trend -Repeat CMP in the AM   Thrombocytosis -Likely reactive in the setting of infection -Patient's platelet count is worsening from 639 is now 885 -Continue monitor and trend and repeat CBC in a.m.  Macrocytic Anemia.    -Borderline B12 & Iron levels, B12 & Iron replaced, given a unit of packed RBC on 01/06/2021.  -C/w Ferrous Sulfate 325 mg po BID  -hemoglobin/hematocrit was stable but now gone from 8.1/25.2 -> 7.6/23.4 -Resumed PPI, outpt GI follow up .  Hyponatremia.    -Ur Osm << Ser Osm, would suggest dehydration,  -Continues to be orthostatic,  -Resume IVF; Bolus 250 mL NS and resume LR at 75 mL/hr  -Na+ went from 128 -> 132 -> 131 -> 129 -> 128 ->  -Continue to Monitor and Trend -Repeat CMP in the AM .  ? V.Tach in the CT scan department. -Questionable true event versus artifact, echo shows a preserved EF, on high-dose beta-blocker.   -Continue to Monitor electrolytes closely. -Currently he is Symptom-free.  HTN  -His Metoprolol Tartrate 100 mg po BID was adjusted 01/09/21. -C/w Clonidine 0.1 mg po q6hprn SBP>150 mg -Continue to Monitor BP per Protocol  -Last BP reading was 144/96  Significant Orthostatic Hypotension -Has TED Hose -Could be Autonomic Dysfunction -BP dropped from 154/85 Supine -> 69/61 Standing  -May need to [facility] -Place Abdominal binder -Repeat Orthostatic VS when more stable  Hypomagnesemia -Improved and resolved -Patient's magnesium level is now 1.7 -Replete with IV Mag Sulfate 2 grams -Continue monitor and trend repeat magnesium level in AM  Severe Protein Calorie Malnutrition/Underweight   -Nutritionist was consulted for further evaluation recommendations and recommending continuing to advance diet per SLP recommendations that he is now on a dysphagia 2 diet -They have added boost plus p.o. 3 times daily as well as Magic cup and boost breeze 3 times daily and continue with the CIWA scale   DVT prophylaxis: SCDs Code Status: FULL CODE  Family Communication: No family present at bedside Disposition Plan: Pending further Improvement and improvement in his orthostatics and evaluation of his fever and worsening WBC; Now he Decompensated and will  be going to the Neuro-ICU  Status is: Inpatient  Remains inpatient appropriate because:Unsafe d/c plan, IV treatments appropriate due to intensity of illness or inability to take PO and Inpatient level of care appropriate due to severity of illness   Dispo: The patient is from: Home              Anticipated d/c is to: SNF              Patient currently is not medically stable to d/c.   Difficult to place patient No  Consultants:   Neurology  Neurosurgery  PCCM   Procedures:  EEG This study was initially suggestive of moderate diffuse encephalopathy which gradually improved to mild diffuse encephalopathy, nonspecific etiology but likely related to postictal state.No seizures or definite epileptiform discharges were seen throughout the recording.   Antimicrobials:  Anti-infectives (From admission, onward)   Start     Dose/Rate Route Frequency Ordered Stop   01/12/21 1830  piperacillin-tazobactam (ZOSYN) IVPB 3.375 g        3.375 g 12.5 mL/hr over 240 Minutes Intravenous Every 8 hours 01/12/21  1732     01/07/21 2030  amoxicillin-clavulanate (AUGMENTIN) 875-125 MG per tablet 1 tablet  Status:  Discontinued        1 tablet Oral Every 12 hours 01/07/21 1944 01/12/21 1715   01/05/21 1800  Ampicillin-Sulbactam (UNASYN) 3 g in sodium chloride 0.9 % 100 mL IVPB  Status:  Discontinued        3 g 200 mL/hr over 30 Minutes Intravenous Every 6 hours 01/05/21 1657 01/07/21 1034   01/01/21 2330  vancomycin (VANCOREADY) IVPB 1250 mg/250 mL        1,250 mg 166.7 mL/hr over 90 Minutes Intravenous  Once 01/01/21 2302 01/02/21 0301   01/01/21 2315  piperacillin-tazobactam (ZOSYN) IVPB 3.375 g        3.375 g 12.5 mL/hr over 240 Minutes Intravenous  Once 01/01/21 2302 01/02/21 0404        Subjective: Seen And examined at bedside much more worse today than he was yesterday and he is awake and somnolent and only oriented to himself and knows that he is in the hospital.  Nursing states that  he started having some arm spasms and some posturing.  He continues to hallucinate.  He denies any pain currently.  No other concerns or complaints at this time.  Objective: Vitals:   01/13/21 0715 01/13/21 0859 01/13/21 1305 01/13/21 1511  BP: 140/86 (!) 150/90 140/81 (!) 144/96  Pulse: 94 96 100 88  Resp: 20 20 20 17   Temp: 98.1 F (36.7 C) 98.8 F (37.1 C) 99 F (37.2 C) 99.2 F (37.3 C)  TempSrc: Axillary Axillary Axillary Axillary  SpO2: 100% 100% 100% 100%  Weight:      Height:        Intake/Output Summary (Last 24 hours) at 01/13/2021 1517 Last data filed at 01/13/2021 0834 Gross per 24 hour  Intake 406.25 ml  Output 2340 ml  Net -1933.75 ml   Filed Weights   01/08/21 0409 01/09/21 0558 01/11/21 0414  Weight: 65.5 kg 65.2 kg 63.8 kg   Examination: Physical Exam:  Constitutional: Thin underweight African-American male currently in no acute distress he is more somnolent and altered Eyes: Lids and conjunctivae normal, sclerae anicteric  ENMT: External Ears, Nose appear normal Neck: Appears normal, supple, no cervical masses, normal ROM, no appreciable thyromegaly; no appreciable JVD Respiratory: Diminished to auscultation bilaterally, no wheezing, rales, rhonchi or crackles. Normal respiratory effort and patient is not tachypenic. No accessory muscle use.  Unlabored breathing Cardiovascular: RRR, no murmurs / rubs / gallops. S1 and S2 auscultated. No extremity edema. Abdomen: Soft, non-tender, non-distended. No masses palpated. No appreciable hepatosplenomegaly. Bowel sounds positive.  GU: Deferred. Musculoskeletal: No clubbing / cyanosis of digits/nails.  Is a right metatarsal amputation Skin: No rashes, lesions, ulcers on his skin evaluation. No induration; Warm and dry.  Neurologic: Does not follow commands as much and has no focal deficits noted but is more altered and confused Psychiatric: Impaired judgment and insight.  He is awake but he is not fully; oriented  only to his name and knows he is in the hospital. Normal mood and appropriate affect.   Data Reviewed: I have personally reviewed following labs and imaging studies  CBC: Recent Labs  Lab 01/09/21 0038 01/10/21 0138 01/11/21 0120 01/12/21 0043 01/12/21 1649 01/13/21 0143  WBC 8.9 12.1* 11.5* 13.5* 14.9* 14.5*  NEUTROABS 4.9 8.4* 8.0* 9.6*  --  10.8*  HGB 8.0* 7.9* 8.0* 8.0* 8.1* 7.6*  HCT 23.6* 23.7* 24.8* 24.9* 25.2* 23.4*  MCV 97.9  99.2 101.2* 102.0* 101.2* 99.2  PLT 422* 543* 639* 783* 885* [id number]*   Basic Metabolic Panel: Recent Labs  Lab 01/06/21 1518 01/07/21 0059 01/08/21 0718 01/09/21 0038 01/10/21 0138 01/11/21 0120 01/12/21 0043 01/12/21 1649 01/13/21 0143  NA 133*   < > 128*   < > 131* 129* 128* 131* 130*  K 3.9   < > 3.7   < > 4.1 4.6 4.3 4.4 4.5  CL 101   < > 96*   < > 103 98 98 98 99  CO2 24   < > 21*   < > 20* 20* 22 22 23   GLUCOSE 96   < > 90   < > 99 74 68* 82 92  BUN 7*   < > 5*   < > 9 7* 7* 9 8  CREATININE 0.76   < > 0.89   < > 0.79 0.81 0.88 0.81 0.94  CALCIUM 9.1   < > 8.8*   < > 8.9 9.0 9.1 9.3 9.1  MG  --    < > 1.6*   < > 1.8 1.7 1.9 1.8 1.7  PHOS 3.8  --  3.2  --   --   --   --  4.0  --    < > = values in this interval not displayed.   GFR: Estimated Creatinine Clearance: 72.6 mL/min (by C-G formula based on SCr of 0.94 mg/dL). Liver Function Tests: Recent Labs  Lab 01/10/21 0138 01/11/21 0120 01/12/21 0043 01/12/21 1649 01/13/21 0143  AST 16 22 18 20 15   ALT 10 12 10 12 12   ALKPHOS 55 55 52 53 49  BILITOT 0.7 0.8 0.6 0.5 0.6  PROT 5.8* 6.0* 5.9* 6.3* 5.7*  ALBUMIN 2.6* 2.7* 2.6* 2.8* 2.5*   No results for input(s): LIPASE, AMYLASE in the last 168 hours. No results for input(s): AMMONIA in the last 168 hours. Coagulation Profile: No results for input(s): INR, PROTIME in the last 168 hours. Cardiac Enzymes: No results for input(s): CKTOTAL, CKMB, CKMBINDEX, TROPONINI in the last 168 hours. BNP (last 3 results) No results for  input(s): PROBNP in the last 8760 hours. HbA1C: No results for input(s): HGBA1C in the last 72 hours. CBG: Recent Labs  Lab 01/06/21 1533 01/06/21 1950 01/06/21 2352 01/07/21 0335 01/07/21 0807  GLUCAP 213* 90 88 95 88   Lipid Profile: No results for input(s): CHOL, HDL, LDLCALC, TRIG, CHOLHDL, LDLDIRECT in the last 72 hours. Thyroid Function Tests: No results for input(s): TSH, T4TOTAL, FREET4, T3FREE, THYROIDAB in the last 72 hours. Anemia Panel: No results for input(s): VITAMINB12, FOLATE, FERRITIN, TIBC, IRON, RETICCTPCT in the last 72 hours. Sepsis Labs: Recent Labs  Lab 01/08/21 0718 01/09/21 0038 01/10/21 0138 01/11/21 0120  PROCALCITON <0.10 <0.10 <0.10 <0.10    Recent Results (from the past 240 hour(s))  Culture, blood (routine x 2)     Status: None   Collection Time: 01/04/21 10:00 PM   Specimen: BLOOD RIGHT HAND  Result Value Ref Range Status   Specimen Description BLOOD RIGHT HAND  Final   Special Requests   Final    BOTTLES DRAWN AEROBIC AND ANAEROBIC Blood Culture adequate volume   Culture   Final    NO GROWTH 5 DAYS Performed at [facility], [address] [place], [place] [address]    Report Status 01/09/2021 FINAL  Final  Culture, blood (routine x 2)     Status: None   Collection Time: 01/04/21 10:09 PM  Specimen: BLOOD RIGHT HAND  Result Value Ref Range Status   Specimen Description BLOOD RIGHT HAND  Final   Special Requests   Final    AEROBIC BOTTLE ONLY Blood Culture results may not be optimal due to an inadequate volume of blood received in culture bottles   Culture   Final    NO GROWTH 5 DAYS Performed at [facility], [place] [address], [place], [place] [address]    Report Status 01/09/2021 FINAL  Final  SARS CORONAVIRUS 2 (TAT 6-24 HRS) Nasopharyngeal Nasopharyngeal Swab     Status: None   Collection Time: 01/11/21 10:05 AM   Specimen: Nasopharyngeal Swab  Result Value Ref Range Status   SARS Coronavirus 2 NEGATIVE NEGATIVE  Final    Comment: (NOTE) SARS-CoV-2 target nucleic acids are NOT DETECTED.  The SARS-CoV-2 RNA is generally detectable in upper and lower respiratory specimens during the acute phase of infection. Negative results do not preclude SARS-CoV-2 infection, do not rule out co-infections with other pathogens, and should not be used as the sole basis for treatment or other patient management decisions. Negative results must be combined with clinical observations, patient history, and epidemiological information. The expected result is Negative.  Fact Sheet for Patients: SugarRoll.be  Fact Sheet for Healthcare Providers: https://www.woods-mathews.com/  This test is not yet approved or cleared by the [place] FDA and  has been authorized for detection and/or diagnosis of SARS-CoV-2 by FDA under an Emergency Use Authorization (EUA). This EUA will remain  in effect (meaning this test can be used) for the duration of the COVID-19 declaration under Se ction 564(b)(1) of the Act, 21 U.S.C. section 360bbb-3(b)(1), unless the authorization is terminated or revoked sooner.  Performed at [facility], [place] [address] [place], [place] [address]   Culture, blood (routine x 2)     Status: None (Preliminary result)   Collection Time: 01/12/21  6:01 PM   Specimen: BLOOD  Result Value Ref Range Status   Specimen Description BLOOD SITE NOT SPECIFIED  Final   Special Requests   Final    BOTTLES DRAWN AEROBIC ONLY Blood Culture results may not be optimal due to an inadequate volume of blood received in culture bottles   Culture   Final    NO GROWTH < 24 HOURS Performed at [facility], [place] [address], [place], [place] [address]    Report Status PENDING  Incomplete  Culture, blood (routine x 2)     Status: None (Preliminary result)   Collection Time: 01/12/21  7:13 PM   Specimen: BLOOD RIGHT ARM  Result Value Ref Range Status   Specimen  Description BLOOD RIGHT ARM  Final   Special Requests   Final    BOTTLES DRAWN AEROBIC AND ANAEROBIC Blood Culture results may not be optimal due to an inadequate volume of blood received in culture bottles   Culture   Final    NO GROWTH < 24 HOURS Performed at [facility], [place] [address], [place], [place] [address]    Report Status PENDING  Incomplete    RN Pressure Injury Documentation:     Estimated body mass index is 17.58 kg/m as calculated from the following:   Height as of this encounter: 6\' 3"  (1.905 m).   Weight as of this encounter: 63.8 kg.  Malnutrition Type:  Nutrition Problem: Severe Malnutrition Etiology: chronic illness,poor appetite  Malnutrition Characteristics:  Signs/Symptoms: percent weight loss,moderate fat depletion,severe fat depletion,severe muscle depletion,per patient/family report  Percent weight loss: 10.6 %  Nutrition Interventions:  Interventions: Boost Breeze,Magic cup,MVI,Boost Plus   Radiology Studies: CT HEAD WO CONTRAST  Addendum Date: 01/13/2021   ADDENDUM REPORT: 01/13/2021 14:48 ADDENDUM: Impressions #1 and #2 called by telephone at the time of interpretation on 01/13/2021 at 2:44 pm to provider [facility] , who verbally acknowledged these results. Electronically Signed   By: Kellie Simmering DO   On: 01/13/2021 14:48   Result Date: 01/13/2021 CLINICAL DATA:  Mental status change, unknown cause. EXAM: CT HEAD WITHOUT CONTRAST TECHNIQUE: Contiguous axial images were obtained from the base of the skull through the vertex without intravenous contrast. COMPARISON:  Prior head CT examinations 01/06/2021 and earlier. FINDINGS: Brain: Mild-to-moderate cerebral and cerebellar atrophy. Continued interval increase in size of a mixed density subdural hematoma overlying the left cerebral hemisphere as compared to the head CT of 01/06/2021. The hematoma now measures up to 14 mm in greatest thickness (previously 10 mm). Increased mass effect upon the  left cerebral hemisphere with increased partial effacement of the left lateral ventricle. Increased midline shift measured at the level of the septum pellucidum, now 8 mm (previously 4 mm). There is slight asymmetric effacement of the basal cisterns on the left (series 3, image 14). Stable trace parafalcine subdural hemorrhage. Stable chronic small vessel ischemic disease within the cerebral white matter. 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 Redemonstrated 9 mm dural-based mass along the right aspect of the falx, overlying the medial right frontal lobe, likely reflecting an incidental meningioma (series 5, image 35). Vascular: These form dilation of the distal cervical left ICA without sclerotic calcification at this site. The visualized distal cervical left ICA measures up to 15 mm in diameter. This appears to have been present on prior examinations dating back this may reflect a fusiform aneurysm. No hyperdense vessel. Atherosclerotic calcifications at the base of the brain. Skull: Normal. Negative for fracture or focal lesion. Sinuses/Orbits: Visualized orbits show no acute finding. Mild bilateral frontal and ethmoid sinus mucosal thickening. Moderate-sized mucous retention cyst versus polypoid mucosal thickening within the right maxillary sinus IMPRESSION: Interval increase in size of a mixed density subdural hematoma overlying the left cerebral hemisphere as compared to 01/06/2021. The hematoma now measures 14 mm in greatest thickness (previously 10 mm). Increased mass effect upon the left cerebral hemisphere with increased partial effacement of the left lateral ventricle. Increased midline shift, now 8 mm (previously 4 mm). There is now mild asymmetric effacement of the basal cisterns on the left. Unchanged trace parafalcine subdural hemorrhage. Stable background cerebral white matter chronic small vessel ischemic disease. Redemonstrated chronic infarcts within bilateral  cerebellar hemispheres. 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 Fusiform dilation of the partially imaged distal cervical left ICA, measuring up to 15 mm in diameter. 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 Consider non-emergent MR or CT angiography for further evaluation. Electronically Signed: By: Kellie Simmering DO On: 01/13/2021 14:17   DG CHEST PORT 1 VIEW  Result Date: 01/13/2021 CLINICAL DATA:  Shortness of breath. EXAM: PORTABLE CHEST 1 VIEW COMPARISON:  01/10/2021.  01/08/2021. FINDINGS: Mediastinum and hilar structures normal. Low lung volumes. Mild right base recurrent atelectatic changes again noted. No prominent pleural effusion. No pneumothorax. Prior cervical spine fusion. IMPRESSION: Mild right base recurrent atelectatic changes again noted. Electronically Signed   By: Low[place]   On: 01/13/2021 06:03    Scheduled Meds: . sodium chloride   Intravenous Once  .  aspirin  81 mg Oral Daily  . Chlorhexidine Gluconate Cloth  6 each Topical Daily  . feeding supplement  1 Container Oral TID BM  . ferrous sulfate  325 mg Oral BID WC  . finasteride  5 mg Oral Daily  . fluticasone furoate-vilanterol  1 puff Inhalation Daily  . folic acid  1 mg Oral Daily  . furosemide  40 mg Oral Once  . lactose free nutrition  237 mL Oral TID WC  . levETIRAcetam  500 mg Oral BID   Or  . levETIRAcetam  500 mg Oral BID  . LORazepam  0-4 mg Intravenous Q6H   Followed by  . [START ON 01/14/2021] LORazepam  0-4 mg Intravenous Q12H  . metoprolol tartrate  100 mg Oral BID  . multivitamin with minerals  1 tablet Oral Daily  . nicotine  21 mg Transdermal Daily  . pantoprazole  40 mg Oral Daily  . QUEtiapine  25 mg Oral QHS  . rosuvastatin  10 mg Oral Daily  . sodium chloride flush  10-40 mL Intracatheter Q12H  . vitamin B-12  1,000 mcg Oral Daily   Continuous Infusions: . lactated ringers 75 mL/hr at 01/12/21 2332  . piperacillin-tazobactam  (ZOSYN)  IV 3.375 g (01/13/21 1047)  . thiamine injection 500 mg (01/13/21 0904)    LOS: 11 days   Kerney Elbe, DO [facility] PAGER is on [place]  If 7PM-7AM, please contact night-coverage [email or web address]

## 2021-01-14 ENCOUNTER — Inpatient Hospital Stay (HOSPITAL_COMMUNITY): Payer: Medicare Other

## 2021-01-14 DIAGNOSIS — G9340 Encephalopathy, unspecified: Secondary | ICD-10-CM | POA: Diagnosis not present

## 2021-01-14 DIAGNOSIS — R569 Unspecified convulsions: Secondary | ICD-10-CM | POA: Diagnosis not present

## 2021-01-14 LAB — COMPREHENSIVE METABOLIC PANEL
ALT: 10 U/L (ref 0–44)
AST: 17 U/L (ref 15–41)
Albumin: 2.7 g/dL — ABNORMAL LOW (ref 3.5–5.0)
Alkaline Phosphatase: 54 U/L (ref 38–126)
Anion gap: 9 (ref 5–15)
BUN: 8 mg/dL (ref 8–23)
CO2: 22 mmol/L (ref 22–32)
Calcium: 9.2 mg/dL (ref 8.9–10.3)
Chloride: 98 mmol/L (ref 98–111)
Creatinine, Ser: 0.9 mg/dL (ref 0.61–1.24)
GFR, Estimated: >60 mL/min (ref >60)
Glucose, Bld: 87 mg/dL (ref 70–99)
Potassium: 4.3 mmol/L (ref 3.5–5.1)
Sodium: 129 mmol/L — ABNORMAL LOW (ref 135–145)
Total Bilirubin: 0.8 mg/dL (ref 0.3–1.2)
Total Protein: 6.3 g/dL — ABNORMAL LOW (ref 6.5–8.1)

## 2021-01-14 LAB — PHOSPHORUS
Phosphorus: 4.8 mg/dL — ABNORMAL HIGH (ref 2.5–4.6)
Phosphorus: 4.4 mg/dL (ref 2.5–4.6)

## 2021-01-14 LAB — GLUCOSE, CAPILLARY
Glucose-Capillary: 117 mg/dL — ABNORMAL HIGH (ref 70–99)
Glucose-Capillary: 141 mg/dL — ABNORMAL HIGH (ref 70–99)
Glucose-Capillary: 147 mg/dL — ABNORMAL HIGH (ref 70–99)
Glucose-Capillary: 83 mg/dL (ref 70–99)
Glucose-Capillary: 85 mg/dL (ref 70–99)
Glucose-Capillary: 91 mg/dL (ref 70–99)

## 2021-01-14 LAB — CBC WITH DIFFERENTIAL/PLATELET
Abs Immature Granulocytes: 0.09 10*3/uL — ABNORMAL HIGH (ref 0.00–0.07)
Basophils Absolute: 0.1 10*3/uL (ref 0.0–0.1)
Basophils Relative: 1 %
Eosinophils Absolute: 0.2 10*3/uL (ref 0.0–0.5)
Eosinophils Relative: 2 %
HCT: 25.4 % — ABNORMAL LOW (ref 39.0–52.0)
Hemoglobin: 8.2 g/dL — ABNORMAL LOW (ref 13.0–17.0)
Immature Granulocytes: 1 %
Lymphocytes Relative: 11 %
Lymphs Abs: 1.2 10*3/uL (ref 0.7–4.0)
MCH: 32.3 pg (ref 26.0–34.0)
MCHC: 32.3 g/dL (ref 30.0–36.0)
MCV: 100 fL (ref 80.0–100.0)
Monocytes Absolute: 1.4 10*3/uL — ABNORMAL HIGH (ref 0.1–1.0)
Monocytes Relative: 13 %
Neutro Abs: 8.1 10*3/uL — ABNORMAL HIGH (ref 1.7–7.7)
Neutrophils Relative %: 72 %
Platelets: 858 10*3/uL — ABNORMAL HIGH (ref 150–400)
RBC: 2.54 MIL/uL — ABNORMAL LOW (ref 4.22–5.81)
RDW: 16.5 % — ABNORMAL HIGH (ref 11.5–15.5)
WBC: 11.1 10*3/uL — ABNORMAL HIGH (ref 4.0–10.5)
nRBC: 0 % (ref 0.0–0.2)

## 2021-01-14 LAB — BRAIN NATRIURETIC PEPTIDE: B Natriuretic Peptide: 69.7 pg/mL (ref 0.0–100.0)

## 2021-01-14 LAB — MAGNESIUM
Magnesium: 2 mg/dL (ref 1.7–2.4)
Magnesium: 1.9 mg/dL (ref 1.7–2.4)

## 2021-01-14 LAB — PROCALCITONIN: Procalcitonin: <0.1 ng/mL

## 2021-01-14 IMAGING — DX DG ABD PORTABLE 1V
1 series · 1 of 1 positions shown · non-contrast
Comparison: None.

CLINICAL DATA: Cortrak placement

EXAM:
PORTABLE ABDOMEN - 1 VIEW

[abdomen supine]
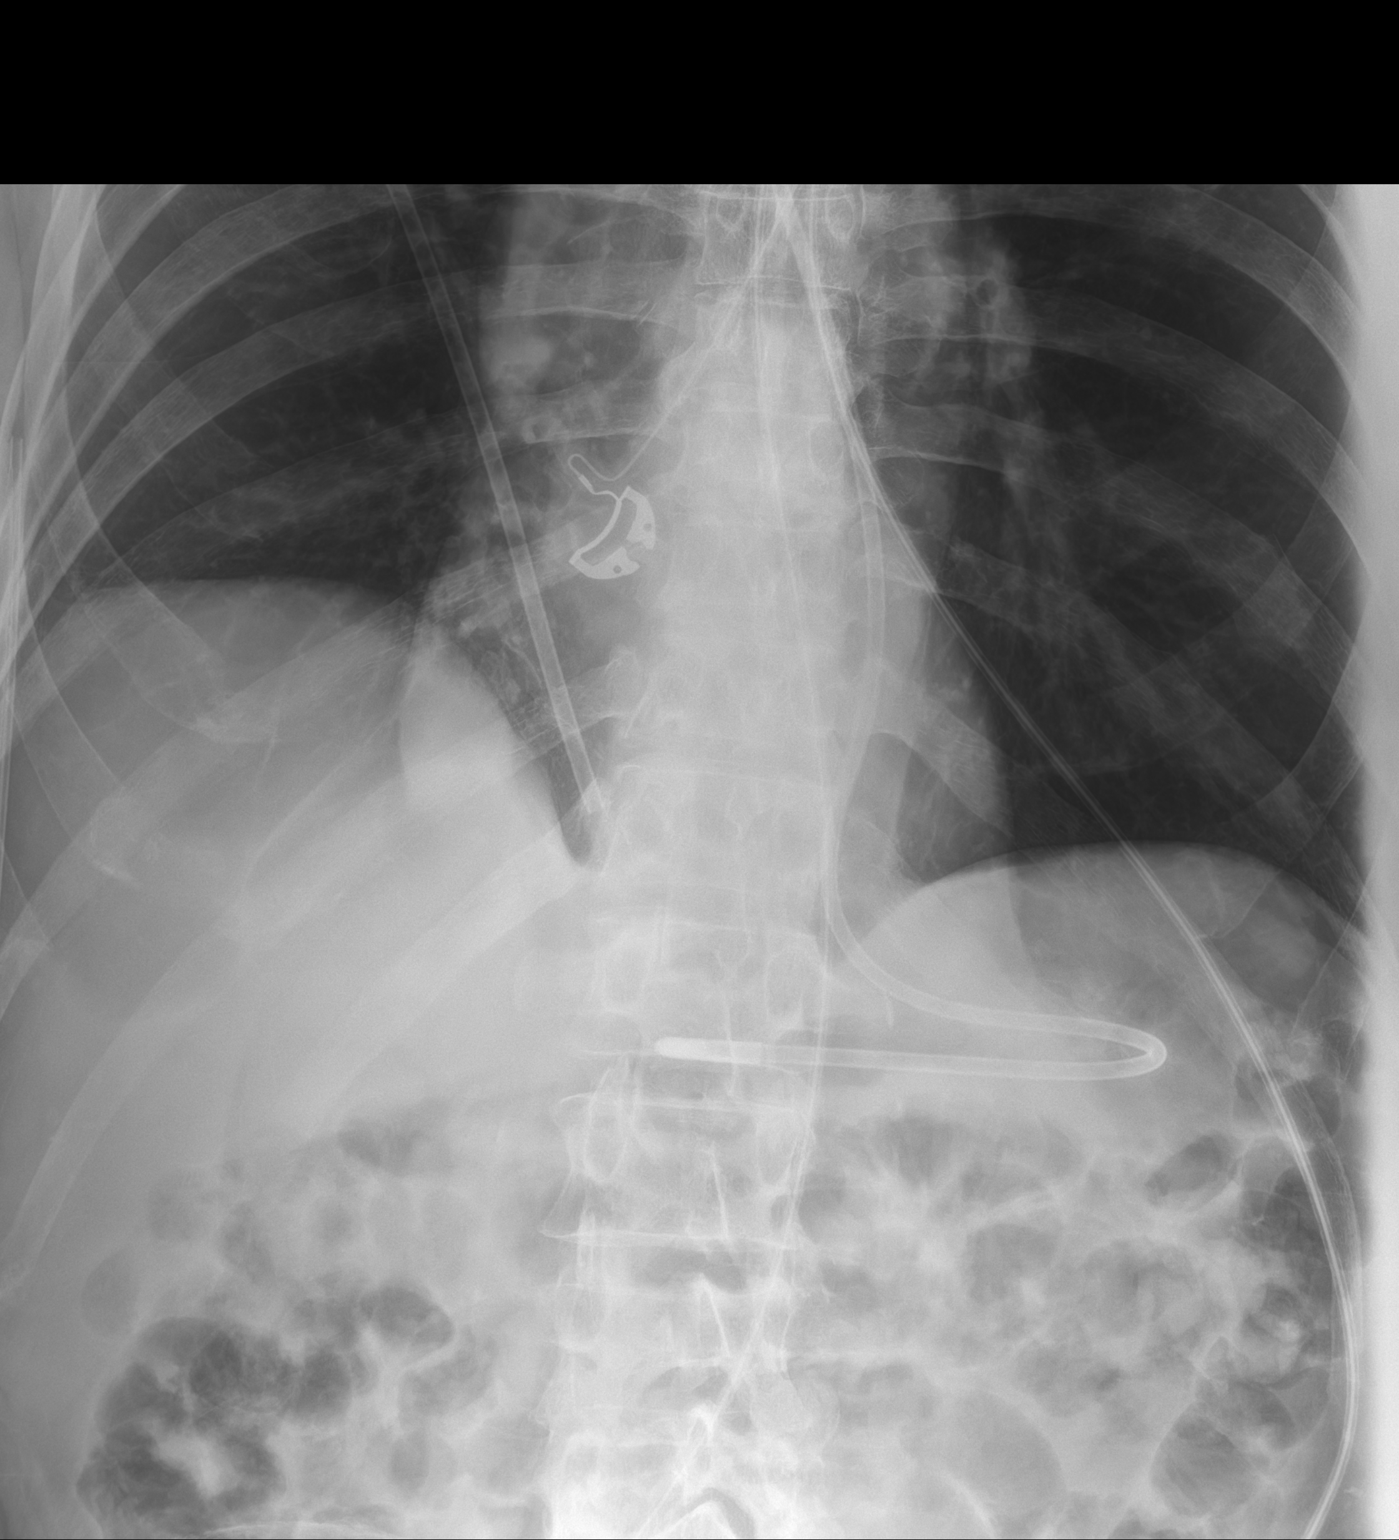

[1 of 1 positions shown; findings below may reference images not displayed]

FINDINGS: Air-filled small and large bowel loops seen within the upper
abdomen. The right hemidiaphragm is elevated. Nasogastric tube
terminates in the region of the gastric body.
IMPRESSION: Nasogastric tube terminates in the region of the gastric body.

## 2021-01-14 MED ORDER — VITAL 1.5 CAL PO LIQD
1000.0000 mL | ORAL | Status: DC
  Administered 2021-01-14 – 2021-01-16 (×3): 1000 mL
  Filled 2021-01-14 (×3): qty 1000

## 2021-01-14 MED ORDER — METOPROLOL TARTRATE 50 MG PO TABS
100.0000 mg | ORAL_TABLET | Freq: Two times a day (BID) | ORAL | Status: DC
  Administered 2021-01-14 – 2021-01-16 (×5): 100 mg
  Filled 2021-01-14 (×6): qty 2

## 2021-01-14 MED ORDER — DEXAMETHASONE SODIUM PHOSPHATE 4 MG/ML IJ SOLN
4.0000 mg | Freq: Every day | INTRAMUSCULAR | Status: DC
  Administered 2021-01-14 – 2021-01-19 (×6): 4 mg via INTRAVENOUS
  Filled 2021-01-14 (×7): qty 1

## 2021-01-14 MED ORDER — ASPIRIN 81 MG PO CHEW
81.0000 mg | CHEWABLE_TABLET | Freq: Every day | ORAL | Status: DC
  Administered 2021-01-14 – 2021-01-16 (×3): 81 mg
  Filled 2021-01-14 (×3): qty 1

## 2021-01-14 MED ORDER — FERROUS SULFATE 300 (60 FE) MG/5ML PO SYRP
300.0000 mg | ORAL_SOLUTION | Freq: Two times a day (BID) | ORAL | Status: DC
  Administered 2021-01-14 – 2021-01-16 (×6): 300 mg
  Filled 2021-01-14 (×7): qty 5

## 2021-01-14 MED ORDER — VITAMIN B-12 1000 MCG PO TABS
1000.0000 ug | ORAL_TABLET | Freq: Every day | ORAL | Status: DC
  Administered 2021-01-14 – 2021-01-16 (×3): 1000 ug
  Filled 2021-01-14 (×3): qty 1

## 2021-01-14 MED ORDER — ROSUVASTATIN CALCIUM 20 MG PO TABS
10.0000 mg | ORAL_TABLET | Freq: Every day | ORAL | Status: DC
  Administered 2021-01-14 – 2021-01-16 (×3): 10 mg
  Filled 2021-01-14 (×3): qty 1

## 2021-01-14 MED ORDER — ADULT MULTIVITAMIN W/MINERALS CH: 1.0000 | ORAL_TABLET | Freq: Every day | ORAL | Status: DC

## 2021-01-14 MED ORDER — PROSOURCE TF PO LIQD
45.0000 mL | Freq: Every day | ORAL | Status: DC
  Administered 2021-01-14 – 2021-01-16 (×3): 45 mL
  Filled 2021-01-14 (×3): qty 45

## 2021-01-14 MED ORDER — PANTOPRAZOLE SODIUM 40 MG PO PACK
40.0000 mg | PACK | Freq: Every day | ORAL | Status: DC
  Administered 2021-01-14 – 2021-01-16 (×3): 40 mg
  Filled 2021-01-14 (×3): qty 20

## 2021-01-14 MED ORDER — ADULT MULTIVITAMIN W/MINERALS CH
1.0000 | ORAL_TABLET | Freq: Every day | ORAL | Status: DC
  Administered 2021-01-14 – 2021-01-16 (×3): 1
  Filled 2021-01-14 (×3): qty 1

## 2021-01-14 NOTE — Progress Notes (Signed)
Mother and sister came to visit. Sister requested for wallet for safe keeping. Wallet handed over to patients mother.

## 2021-01-14 NOTE — Progress Notes (Signed)
LTM maintenance completed; no skin breakdown was seen. Tested event button with [facility]. 

## 2021-01-14 NOTE — Progress Notes (Signed)
Nutrition Follow-up  DOCUMENTATION CODES:   Underweight,Severe malnutrition in context of chronic illness  INTERVENTION:   Initiate tube feeds via Cortrak: - Vital 1.5 @ 55 ml/hr (1320 ml/day) - ProSource TF 45 ml daily  Tube feeding regimen provides 2020 kcal, 100 grams of protein, and 1008 ml of H2O.  NUTRITION DIAGNOSIS:   Severe Malnutrition related to chronic illness,poor appetite as evidenced by percent weight loss,moderate fat depletion,severe fat depletion,severe muscle depletion,per patient/family report.  Ongoing, being addressed via TF  GOAL:   Patient will meet greater than or equal to 90% of their needs  Met via TF  MONITOR:   Diet advancement,Labs,Weight trends,TF tolerance,Skin,I & O's  REASON FOR ASSESSMENT:   Malnutrition Screening Tool    ASSESSMENT:   Pt admitted with AMS, dehydration, hypoglycemia, and SIRS and later found to have SDH. PMH includes CVA, PAD s/p LLE angiography with DES placed to popliteal artery, L foot osteomyelitis s/p transmetatarsal amputation, HTN, COPD, EtOH and tobacco use.  5/13 - s/p BSE, diet advanced to dysphagia 2 diet with thin liquids 5/19 - transferred to ICU 5/20 - Cortrak placed (tip gastric)  Per notes, pt with acute left SDH with mass effect and midline shift that has worsened. Pt is getting continuous EEG monitoring due to concern for seizures. Neurosurgery has not recommended any acute neurosurgical intervention at this time.  Cortrak placed today and consult received to start tube feeds. Pt unable to communicate with RD at time of visit.  Admit weight: 62.4 kg Current weight: 63.8 kg  Meal Completion: 20-100% x last 8 documented meals  Medications reviewed and include: decadron, ferrous sulfate, MVI with minerals, protonix, vitamin B-12, keppra, IV abx  Labs reviewed: sodium 129, phosphorus 4.8, hemoglobin 8.2 CBG's: 80-96 x 24 hours  UOP: 950 ml x 24 hours I/O's: -7.8 L since admit  Diet Order:    Diet Order            Diet NPO time specified  Diet effective now                 EDUCATION NEEDS:   No education needs have been identified at this time  Skin:  Skin Assessment: Skin Integrity Issues: Other: skin tear to L shoulder  Last BM:  01/12/21 smear type 6  Height:   Ht Readings from Last 1 Encounters:  01/02/21 '6\' 3"'  (1.905 m)    Weight:   Wt Readings from Last 1 Encounters:  01/11/21 63.8 kg    BMI:  Body mass index is 17.58 kg/m.  Estimated Nutritional Needs:   Kcal:  2000-2200  Protein:  100-115 grams  Fluid:  >2L/d    Gustavus Bryant, MS, RD, LDN Inpatient Clinical Dietitian Please see AMiON for contact information.

## 2021-01-14 NOTE — Progress Notes (Signed)
NAME:  Seth [name], MRN:  [id number], DOB:  Mar 28, 1956, LOS: 12 ADMISSION DATE:  01/01/2021, CONSULTATION DATE:  01/06/2021 REFERRING MD:  Dr. Candiss Norse, CHIEF COMPLAINT:  Seizure followed by brief VT arrest    History of Present Illness:  Seth [name] is a 65 y.o. male with PMH significant for prior stroke, HTN. ETOH abuse, PVD s/p DES to popliteal artery, left foor osteomyelitis, GOU, COPD, tobacco use, and depression who presented with for evaluation of AMS. On visit from family patient was seen with AMS prompting EMS call. Per chart review patient did report he passed out the day before ED arrival which was followed by vomiting.   On ED evaluation patient was seen with hypertension, tachycardia, and mildly elevated temp of 99.3. Head CT revealed left sided SDH with 31mm midline shift. Patient was admitted per [facility] with NSG consult.   Afternoon of 5/12 patient was seen with acute neuro change and later seen with full tonic clonic seizure. HE was administered IV ativan and once seizure activity stopped patient was take emergently to radiology for stat repeat head CT. En route to CT patient was seen with decreased mentation yet again and subsequent development of VT with brief (20-30sec) cardiac arrest. VT spontaneously resolved and patient regained pulses without any chest compressions or medications.   On 5/19 PCCM re-engaged as pt with worsening mental status, worsening subdural hemorrhage and is being transferred to the ICU   Pertinent  Medical History  Prior stroke, HTN. ETOH abuse, PVD s/p DES to popliteal artery, left foor osteomyelitis, GOU, COPD, tobacco use, and depression  Significant Hospital Events: Including procedures, antibiotic start and stop dates in addition to other pertinent events   . 5/7 Admitted for AMS  . 5/12 seizure followed by brief (20-30sec) cardiac arrest. VT spontaneously resolved and patient regained pulses without any chest compressions or medications.  . 5/18  worsening mental status noted by RN-- hallucinations, tremor, increased encephalopathy and somnolence. Put on CIWA protocol. . 5/19 worsening mental status -- repeat CT H acquired revealing increased subdural hematoma, increased mass effect, effacement of basal cisterns on L. Got 2mg  ativan for tremor vs generalized tonic clonic sz. Transferring to the ICU. Neurology, [facility] and PCCM called   Interim History / Subjective:   Hemiparesis documented 5/19, awake, moving all extremities 5/20 Last fever documented as 5/18 100.64F, procalcitonin -5/19 I/O- 8 L total   Objective   Blood pressure (!) 148/91, pulse (!) 110, temperature 98.4 F (36.9 C), temperature source Oral, resp. rate (!) 25, height 6\' 3"  (1.905 m), weight 63.8 kg, SpO2 100 %.        Intake/Output Summary (Last 24 hours) at 01/14/2021 0807 Last data filed at 01/14/2021 0500 Gross per 24 hour  Intake 1608.68 ml  Output 950 ml  Net 658.68 ml   Filed Weights   01/08/21 0409 01/09/21 0558 01/11/21 0414  Weight: 65.5 kg 65.2 kg 63.8 kg    Examination: General: Thin ill appearing man, laying sideways in bed, mitts on HEENT: Some temporal wasting, oropharynx clear, no upper airway secretions Neuro: Sleepy but wakes easily to command.  Moves all extremities with good strength.  Moderate cough strength CV: Regular, distant, no murmur PULM: distant but clear B, decreased at both bases GI: thin, benign, + BS Extremities: L forefoot amputation, thin, no edema Skin: scattered ecchymoses, no rash  Labs/imaging that I have personally reviewed    CT H 5/19> increased side L SDH (81mm). Increased mass effect, increased parital  effacement of L lateral ventricle, Increased midline shift (41mm), some asymmetric effacement of L basal cisterns. Stable trace parafalcine Subdural hemorrhage. Chronic white matter small vessel disease. Chronic bilateral cerebellar infarcts. 41mm suspected meningioma R aspect of falx. 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 Chronic changes.   Resolved Hospital Problem list    VT arrest (brief arrest 5/12 x 20-30seconds) Assessment & Plan:   Acute Left SDH with mass effect and midline shift , worsening -5/19 incr size SDH to 45mm from 72mm, and incr midline shift 62mm from 74mm, incr mass effect  Seizure -reported throughout hospital course -- most recently noted by neuro note 5/19 Acute Encephalopathy-- worse 5/19, improving 5/20 -in setting of above and metabolic  -mental status reportedly started to decline 5/18 approx 1700-- no imaging at that time, thought possible EtOH w/d. Continued to decline 5/19, neuro consulted by primary and sent for CT H which revealed worsening intracranial process as above  P; -Continue ICU monitoring, assess airway protection, push pulmonary hygiene -Antibiotics as below -Frequent neurochecks -Holding anticoagulation -Minimize sedating medication as able -Appreciate neurology and neurosurgery assistance.  Plan to continue [place].  Continuous EEG running.  Following neurological status, reimaging as per neurosurgery plans.  Recommended possible corticosteroids to assist with cerebral edema, reabsorption of chronic SDH.  Plan to add dexamethasone 5/20  Hx COPD Suspected aspiration PNA  P: -Augmentin broadened to Zosyn on 5/19.  Day 10 total antibiotics -Follow chest x-ray -On Breo, albuterol  Possible sepsis -TMax 100.4, WBC 14.9 (incr from 11.5) -could be infectious vs related to blood in the brain.  -has actually been on abx since 5/9 P -Procalcitonin reassuring.  Consider DC antibiotics 5/21 (complete 10 days) -Continue to follow culture data, all negative so far  Hyponatremia -has been low 130s to high 120s since 5/12 P -Sodium overall stable.  Continue to follow  Hypertension P -Metoprolol -Clonidine currently ordered as needed.  Plan to  discontinue.  If blood pressure rises then may consider starting scheduled  Hx EtOH abuse -Patient reports I1/2 -1 pint of vodka per day -has been admitted since 5/7 so probably outside of CIWA window by 5/19 P: -CIWA coverage discontinued 5/19 -Has completed thiamine and folate repletion  Hx CVA  P -On aspirin, statin -Home Plavix is on hold given the SDH  Anemia -7.6 from 8.1 on 5/19 with incr SDH  P: -Hemoglobin goal 7.0  Hx PAD s/p DES to popliteal artery Hx L transmetatarsal amputation, osteomyelitis -Home medications include DAPT therapy with statin and Plavix P: -Holding DAPT  -Continue statin   Best practice (right click and "Reselect all SmartList Selections" daily)  Diet:  NPO Pain/Anxiety/Delirium protocol (if indicated): No VAP protocol (if indicated): Not indicated DVT prophylaxis: SCD GI prophylaxis: PPI Glucose control:  SSI No Central venous access:  N/A Arterial line:  N/A Foley:  N/A Mobility:  bed rest  PT consulted: N/A Last date of multidisciplinary goals of care discussion Pending  Code Status:  full code Disposition: ICU  Labs   CBC: Recent Labs  Lab 01/09/21 0038 01/10/21 0138 01/11/21 0120 01/12/21 0043 01/12/21 1649 01/13/21 0143  WBC 8.9 12.1* 11.5* 13.5* 14.9* 14.5*  NEUTROABS 4.9 8.4* 8.0* 9.6*  --  10.8*  HGB 8.0* 7.9* 8.0* 8.0* 8.1* 7.6*  HCT 23.6* 23.7* 24.8* 24.9* 25.2* 23.4*  MCV 97.9 99.2 101.2* 102.0* 101.2* 99.2  PLT 422* 543* 639* 783* 885* 839*    Basic Metabolic Panel: Recent Labs  Lab 01/08/21 0718 01/09/21 0038 01/10/21 0138 01/11/21 0120 01/12/21 0043 01/12/21 1649 01/13/21 0143  NA 128*   < > 131* 129* 128* 131* 130*  K 3.7   < > 4.1 4.6 4.3 4.4 4.5  CL 96*   < > 103 98 98 98 99  CO2 21*   < > 20* 20* 22 22 23   GLUCOSE 90   < > 99 74 68* 82 92  BUN 5*   < > 9 7* 7* 9 8  CREATININE 0.89   < > 0.79 0.81 0.88 0.81 0.94  CALCIUM 8.8*   < > 8.9 9.0 9.1 9.3 9.1  MG 1.6*   < > 1.8 1.7 1.9 1.8 1.7   PHOS 3.2  --   --   --   --  4.0  --    < > = values in this interval not displayed.   GFR: Estimated Creatinine Clearance: 72.6 mL/min (by C-G formula based on SCr of 0.94 mg/dL). Recent Labs  Lab 01/09/21 0038 01/10/21 0138 01/11/21 0120 01/12/21 0043 01/12/21 1649 01/13/21 0143 01/13/21 0208  PROCALCITON <0.10 <0.10 <0.10  --   --   --  <0.10  WBC 8.9 12.1* 11.5* 13.5* 14.9* 14.5*  --     Liver Function Tests: Recent Labs  Lab 01/10/21 0138 01/11/21 0120 01/12/21 0043 01/12/21 1649 01/13/21 0143  AST 16 22 18 20 15   ALT 10 12 10 12 12   ALKPHOS 55 55 52 53 49  BILITOT 0.7 0.8 0.6 0.5 0.6  PROT 5.8* 6.0* 5.9* 6.3* 5.7*  ALBUMIN 2.6* 2.7* 2.6* 2.8* 2.5*   No results for input(s): LIPASE, AMYLASE in the last 168 hours. No results for input(s): AMMONIA in the last 168 hours.  ABG    Component Value Date/Time   PHART 7.475 (H) 07/20/2020 0240   PCO2ART 20.6 (L) 07/20/2020 0240   PO2ART 95 07/20/2020 0240   HCO3 15.1 (L) 07/20/2020 0240   TCO2 16 (L) 07/20/2020 0240   ACIDBASEDEF 7.0 (H) 07/20/2020 0240   O2SAT 98.0 07/20/2020 0240     Coagulation Profile: No results for input(s): INR, PROTIME in the last 168 hours.  Cardiac Enzymes: No results for input(s): CKTOTAL, CKMB, CKMBINDEX, TROPONINI in the last 168 hours.  HbA1C: Hgb A1c MFr Bld  Date/Time Value Ref Range Status  07/20/2020 07:41 AM 4.2 (L) 4.8 - 5.6 % Final    Comment:    (NOTE) Pre diabetes:          5.7%-6.4%  Diabetes:              >6.4%  Glycemic control for   <7.0% adults with diabetes     CBG: Recent Labs  Lab 01/13/21 1618 01/13/21 1934 01/13/21 2310 01/14/21 0347 01/14/21 0726  GLUCAP 84 80 96 85 91    CRITICAL CARE Performed by: Collene Gobble   Total critical care time: 32 minutes  Critical care time was exclusive of separately billable procedures and treating other patients. Critical care was necessary to treat or prevent imminent or life-threatening  deterioration.  Critical care was time spent personally by me on the following activities: development of treatment plan with patient and/or surrogate as well as nursing, discussions with consultants, evaluation of patient's response to treatment, examination of patient, obtaining history from patient or surrogate, ordering and performing treatments and interventions, ordering and review of laboratory studies, ordering  and review of radiographic studies, pulse oximetry and re-evaluation of patient's condition.  Baltazar Apo, MD, PhD 01/14/2021, 8:22 AM [facility] [phone] or if no answer before 7:00PM call [phone] For any issues after 7:00PM please call eLink [phone]

## 2021-01-14 NOTE — Progress Notes (Addendum)
Subjective: Seen by neurosurgery overnight, did not recommend any acute neurosurgical intervention.  Was started on dexamethasone.  ROS: Unable to obtain due to poor mental status  Examination  Vital signs in last 24 hours: Temp:  [98.3 F (36.8 C)-99.5 F (37.5 C)] 98.8 F (37.1 C) (05/20 1126) Pulse Rate:  [88-111] 110 (05/20 1100) Resp:  [13-29] 24 (05/20 1100) BP: (125-173)/(76-128) 149/109 (05/20 1100) SpO2:  [98 %-100 %] 98 % (05/20 1100)  General: lying in bed, not in apparent distress CVS: pulse-normal rate and rhythm RS: breathing comfortably, coarse breath sounds bilaterally Extremities: normal, warm Neuro: Awake, alert, oriented to self, place, not to time, able to name 2 out of 3 objects, able to follow simple one-step commands, able to tell me 2+2=4 and there are 4 quarters in a dollar, 5/5 in LEFT upper extremity, 4/5 in right upper extremity, 4/5 in bilateral lower extremities  Basic Metabolic Panel: Recent Labs  Lab 01/08/21 0718 01/09/21 0038 01/11/21 0120 01/12/21 0043 01/12/21 1649 01/13/21 0143 01/14/21 0914  NA 128*   < > 129* 128* 131* 130* 129*  K 3.7   < > 4.6 4.3 4.4 4.5 4.3  CL 96*   < > 98 98 98 99 98  CO2 21*   < > 20* 22 22 23 22   GLUCOSE 90   < > 74 68* 82 92 87  BUN 5*   < > 7* 7* 9 8 8   CREATININE 0.89   < > 0.81 0.88 0.81 0.94 0.90  CALCIUM 8.8*   < > 9.0 9.1 9.3 9.1 9.2  MG 1.6*   < > 1.7 1.9 1.8 1.7 2.0  PHOS 3.2  --   --   --  4.0  --  4.8*   < > = values in this interval not displayed.    CBC: Recent Labs  Lab 01/10/21 0138 01/11/21 0120 01/12/21 0043 01/12/21 1649 01/13/21 0143 01/14/21 0914  WBC 12.1* 11.5* 13.5* 14.9* 14.5* 11.1*  NEUTROABS 8.4* 8.0* 9.6*  --  10.8* 8.1*  HGB 7.9* 8.0* 8.0* 8.1* 7.6* 8.2*  HCT 23.7* 24.8* 24.9* 25.2* 23.4* 25.4*  MCV 99.2 101.2* 102.0* 101.2* 99.2 100.0  PLT 543* 639* 783* 885* 839* 858*     Coagulation Studies: No results for input(s): LABPROT, INR in the last 72  hours.  Imaging No new brain imaging overnight  ASSESSMENT AND PLAN: 65 year old male with alcohol use who presented with altered mental status and was found to have left subdural hematoma which did not require any neurosurgical intervention. Had generalized tonic-clonic seizure-like episode this afternoon.  Acute subdural hematomawith right upper extremity hemiparesis Acute symptomatic seizure Acute encephalopathy,worsening Visual hallucinations Alcohol use Thrombocytosis Anemia Leukocytosis Hypoproteinemia with hypoalbuminemia -Acute encephalopathy due to worsening subdural hematoma, seizures, possible infection  Recommendations: -Continue video EEG to look for intermittent seizures.  If no seizures, will likely discontinue tomorrow -Evaluated by neurosurgery, started on dexamethasone.  Neurosurgery will consider bur hole evacuation in a few days  -Continue Keppra 500 mg twice daily.   -Seizure precautionsincluding do not drive -as needed IV Ativan 2 mg for clinical seizure-like activity lasting more than 2 minutes  I have spent a total of25  minuteswith the patient reviewing hospitalnotes,  test results, labs and examining the patient as well as establishing an assessment and plan.>50% of time was spent in direct patient care.  Zeb Comfort Epilepsy [facility] For questions after 5pm please refer to AMION to reach the Neurologist on call

## 2021-01-14 NOTE — Progress Notes (Signed)
LTM maint complete - All leads in place.  Redness at lead placement not noted. [facility] monitored, Event button test confirmed by [facility].

## 2021-01-14 NOTE — Procedures (Addendum)
Patient Name:Seth [name] GSU:[id number] Epilepsy Attending:Lebron Nauert [name] Referring Physician/Provider:Dr Zeb Comfort Duration:01/13/2021 2114 to 01/14/2021 2114  Patient history:65 year old male with alcohol use who presented with altered mental status and was found to have left subdural hematoma which did not require any neurosurgical intervention. Had generalized tonic-clonic seizure-like episode this afternoon.EEG to evaluate for seizure  Level of alertness:lethargic   AEDs during EEG study:LEV  Technical aspects: This EEG study was done with scalp electrodes positioned according to the 10-20 International system of electrode placement. Electrical activity was acquired at a sampling rate of 500Hz  and reviewed with a high frequency filter of 70Hz  and a low frequency filter of 1Hz . EEG data were recorded continuously and digitally stored.   Description: No posterior dominant rhythm was noted. EEG showed continuous generalizedlow amplitude predominantly 2-3 Hz admixed with intermittent 5-6 Hz theta slowing. Hyperventilation and photic stimulation were not performed.   ABNORMALITY - Continuous slow, generalized  IMPRESSION: This study is suggestive of moderate diffuse encephalopathy, nonspecific etiology but likely related to postictal state.No seizures or definite epileptiform discharges were seen throughout the recording.  [name] [name]

## 2021-01-14 NOTE — Procedures (Signed)
Cortrak  Person Inserting Tube:  Esaw Dace, RD Tube Type:  Cortrak - 43 inches Tube Location:  Left nare Initial Placement:  Stomach Secured by: Bridle Technique Used to Measure Tube Placement:  Documented cm marking at nare/ corner of mouth Cortrak Secured At:  69 cm    Cortrak Tube Team Note:  Consult received to place a Cortrak feeding tube.   X-ray is required, abdominal x-ray has been ordered by the Cortrak team. Please confirm tube placement before using the Cortrak tube.   If the tube becomes dislodged please keep the tube and contact the Cortrak team at [email or web address] ([email or web address]) for replacement.  If after hours and replacement cannot be delayed, place a NG tube and confirm placement with an abdominal x-ray.    Kerman Passey MS, RDN, LDN, CNSC Registered Dietitian III Clinical Nutrition RD Pager and On-Call Pager Number Located in [place]

## 2021-01-14 NOTE — Progress Notes (Signed)
OT Cancellation Note  Patient Details Name: Seth [name] MRN: [id number] DOB: Jan 17, 1956   Cancelled Treatment:    Reason Eval/Treat Not Completed: Patient not medically ready. Patient transferred to ICU overnight. RN  who recommends medical hold at this time. OT will continue efforts.   Gloris Manchester OTR/L Supplemental OT, Department of rehab services [phone]  [name]. 01/14/2021, 8:27 AM

## 2021-01-14 NOTE — Progress Notes (Signed)
PT Cancellation Note  Patient Details Name: Seth [name] MRN: [id number] DOB: [birth year]   Cancelled Treatment:    Reason Eval/Treat Not Completed: Medical issues which prohibited therapy patient has transferred to ICU overnight- discussed with ICU RN, who recommends medical hold for now. Will continue to f/u when he is medically ready.    Windell Norfolk, DPT, PN1   Supplemental Physical Therapist [facility]    Pager [phone] Acute Rehab Office [phone]

## 2021-01-14 NOTE — Progress Notes (Signed)
Subjective: Patient reports Patient has no complaints this morning denies headache  Objective: Vital signs in last 24 hours: Temp:  [98.3 F (36.8 C)-99.5 F (37.5 C)] 98.4 F (36.9 C) (05/20 0728) Pulse Rate:  [88-110] 110 (05/20 0700) Resp:  [13-25] 25 (05/20 0700) BP: (125-173)/(76-128) 148/91 (05/20 0700) SpO2:  [99 %-100 %] 100 % (05/20 0700)  Intake/Output from previous day: 05/19 0701 - 05/20 0700 In: 1608.7 [I.V.:1153.1; IV Piggyback:455.6] Out: 950 [Urine:950] Intake/Output this shift: No intake/output data recorded.  Awake alert oriented x2 moves all extremities symmetrically with equal strength  Lab Results: Recent Labs    01/12/21 1649 01/13/21 0143  WBC 14.9* 14.5*  HGB 8.1* 7.6*  HCT 25.2* 23.4*  PLT 885* 839*   BMET Recent Labs    01/12/21 1649 01/13/21 0143  NA 131* 130*  K 4.4 4.5  CL 98 99  CO2 22 23  GLUCOSE 82 92  BUN 9 8  CREATININE 0.81 0.94  CALCIUM 9.3 9.1    Studies/Results: CT HEAD WO CONTRAST  Addendum Date: 01/13/2021   ADDENDUM REPORT: 01/13/2021 14:48 ADDENDUM: Impressions #1 and #2 called by telephone at the time of interpretation on 01/13/2021 at 2:44 pm to provider [facility] , who verbally acknowledged these results. Electronically Signed   By: Kellie Simmering DO   On: 01/13/2021 14:48   Result Date: 01/13/2021 CLINICAL DATA:  Mental status change, unknown cause. EXAM: CT HEAD WITHOUT CONTRAST TECHNIQUE: Contiguous axial images were obtained from the base of the skull through the vertex without intravenous contrast. COMPARISON:  Prior head CT examinations 01/06/2021 and earlier. FINDINGS: Brain: Mild-to-moderate cerebral and cerebellar atrophy. Continued interval increase in size of a mixed density subdural hematoma overlying the left cerebral hemisphere as compared to the head CT of 01/06/2021. The hematoma now measures up to 14 mm in greatest thickness (previously 10 mm). Increased mass effect upon the left cerebral hemisphere  with increased partial effacement of the left lateral ventricle. Increased midline shift measured at the level of the septum pellucidum, now 8 mm (previously 4 mm). There is slight asymmetric effacement of the basal cisterns on the left (series 3, image 14). Stable trace parafalcine subdural hemorrhage. Stable chronic small vessel ischemic disease within the cerebral white matter. 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 Redemonstrated 9 mm dural-based mass along the right aspect of the falx, overlying the medial right frontal lobe, likely reflecting an incidental meningioma (series 5, image 35). Vascular: These form dilation of the distal cervical left ICA without sclerotic calcification at this site. The visualized distal cervical left ICA measures up to 15 mm in diameter. This appears to have been present on prior examinations dating back this may reflect a fusiform aneurysm. No hyperdense vessel. Atherosclerotic calcifications at the base of the brain. Skull: Normal. Negative for fracture or focal lesion. Sinuses/Orbits: Visualized orbits show no acute finding. Mild bilateral frontal and ethmoid sinus mucosal thickening. Moderate-sized mucous retention cyst versus polypoid mucosal thickening within the right maxillary sinus IMPRESSION: Interval increase in size of a mixed density subdural hematoma overlying the left cerebral hemisphere as compared to 01/06/2021. The hematoma now measures 14 mm in greatest thickness (previously 10 mm). Increased mass effect upon the left cerebral hemisphere with increased partial effacement of the left lateral ventricle. Increased midline shift, now 8 mm (previously 4 mm). There is now mild asymmetric effacement of the basal cisterns on the left. Unchanged trace parafalcine subdural hemorrhage. Stable background cerebral white matter chronic small vessel ischemic  disease. Redemonstrated chronic infarcts within bilateral cerebellar hemispheres.  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 Fusiform dilation of the partially imaged distal cervical left ICA, measuring up to 15 mm in diameter. 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 Consider non-emergent MR or CT angiography for further evaluation. Electronically Signed: By: Kellie Simmering DO On: 01/13/2021 14:17   DG CHEST PORT 1 VIEW  Result Date: 01/13/2021 CLINICAL DATA:  Shortness of breath. EXAM: PORTABLE CHEST 1 VIEW COMPARISON:  01/10/2021.  01/08/2021. FINDINGS: Mediastinum and hilar structures normal. Low lung volumes. Mild right base recurrent atelectatic changes again noted. No prominent pleural effusion. No pneumothorax. Prior cervical spine fusion. IMPRESSION: Mild right base recurrent atelectatic changes again noted. Electronically Signed   By: Marcello Moores  Register   On: 01/13/2021 06:03    Assessment/Plan: 65 year old with known left-sided acute subdural undergoing transformational change now combination of chronic to subacute with a small amount of acute component.  Slightly increased mass-effect but does appear to be improved from reported exam yesterday during the event of the decline.  Patient is currently getting continuous EEG monitoring.  In addition patient was switched from what looks like oral antibiotics to IV Zosyn with persistent fevers.  Patient has multiple medical comorbidities could be in the setting of an aspiration pneumonia or partially treated infection.  Due to patient's improved clinical exam I would favor continued monitoring with the EEG given its more time on the IV antibiotics consider may be some steroids Decadron at 2 to 4 mg IV every 6 if he has no contraindication to help with absorption and possibly a little bit of swelling in favor bur hole evacuation in a couple days once he sat longer to further identify the sources of the fever.  LOS: 12 days     Elaina Hoops 01/14/2021, 8:01 AM

## 2021-01-15 DIAGNOSIS — G9340 Encephalopathy, unspecified: Secondary | ICD-10-CM | POA: Diagnosis present

## 2021-01-15 DIAGNOSIS — R569 Unspecified convulsions: Secondary | ICD-10-CM | POA: Diagnosis not present

## 2021-01-15 LAB — CBC
HCT: 27.1 % — ABNORMAL LOW (ref 39.0–52.0)
Hemoglobin: 8.6 g/dL — ABNORMAL LOW (ref 13.0–17.0)
MCH: 31.4 pg (ref 26.0–34.0)
MCHC: 31.7 g/dL (ref 30.0–36.0)
MCV: 98.9 fL (ref 80.0–100.0)
Platelets: 932 10*3/uL (ref 150–400)
RBC: 2.74 MIL/uL — ABNORMAL LOW (ref 4.22–5.81)
RDW: 16.8 % — ABNORMAL HIGH (ref 11.5–15.5)
WBC: 16 10*3/uL — ABNORMAL HIGH (ref 4.0–10.5)
nRBC: 0 % (ref 0.0–0.2)

## 2021-01-15 LAB — URINE CULTURE

## 2021-01-15 LAB — BASIC METABOLIC PANEL
Anion gap: 10 (ref 5–15)
BUN: 13 mg/dL (ref 8–23)
CO2: 22 mmol/L (ref 22–32)
Calcium: 9.3 mg/dL (ref 8.9–10.3)
Chloride: 99 mmol/L (ref 98–111)
Creatinine, Ser: 0.96 mg/dL (ref 0.61–1.24)
GFR, Estimated: >60 mL/min (ref >60)
Glucose, Bld: 119 mg/dL — ABNORMAL HIGH (ref 70–99)
Potassium: 4.6 mmol/L (ref 3.5–5.1)
Sodium: 131 mmol/L — ABNORMAL LOW (ref 135–145)

## 2021-01-15 LAB — PHOSPHORUS: Phosphorus: 2.3 mg/dL — ABNORMAL LOW (ref 2.5–4.6)

## 2021-01-15 LAB — MAGNESIUM: Magnesium: 1.9 mg/dL (ref 1.7–2.4)

## 2021-01-15 LAB — GLUCOSE, CAPILLARY
Glucose-Capillary: 160 mg/dL — ABNORMAL HIGH (ref 70–99)
Glucose-Capillary: 103 mg/dL — ABNORMAL HIGH (ref 70–99)
Glucose-Capillary: 117 mg/dL — ABNORMAL HIGH (ref 70–99)
Glucose-Capillary: 130 mg/dL — ABNORMAL HIGH (ref 70–99)

## 2021-01-15 MED ORDER — NICOTINE 14 MG/24HR TD PT24
14.0000 mg | MEDICATED_PATCH | Freq: Every day | TRANSDERMAL | Status: DC
  Administered 2021-01-16 – 2021-01-22 (×7): 14 mg via TRANSDERMAL
  Filled 2021-01-15 (×7): qty 1

## 2021-01-15 MED ORDER — CHLORHEXIDINE GLUCONATE 0.12 % MT SOLN
15.0000 mL | Freq: Two times a day (BID) | OROMUCOSAL | Status: DC
  Administered 2021-01-15 – 2021-01-22 (×13): 15 mL via OROMUCOSAL
  Filled 2021-01-15 (×12): qty 15

## 2021-01-15 MED ORDER — ORAL CARE MOUTH RINSE
15.0000 mL | Freq: Two times a day (BID) | OROMUCOSAL | Status: DC
  Administered 2021-01-16 – 2021-01-22 (×11): 15 mL via OROMUCOSAL

## 2021-01-15 NOTE — Progress Notes (Signed)
Neurosurgery Service Progress Note  Subjective: No acute events overnight, some waxing/waning function from discussion with other docs  Objective: Vitals:   01/15/21 0700 01/15/21 0733 01/15/21 0800 01/15/21 0943  BP:   121/85 122/76  Pulse:   87 86  Resp:      Temp: 98.1 F (36.7 C)     TempSrc: Oral     SpO2:  100%    Weight:      Height:        Physical Exam: AOx2 (thinks it's 2000), PERRL, EOMI, FS, TM Speech fluent, interactive, some reactivity in affect, offered to shake hands when I introduced myself Strength 5/5 x4 except obviously limited by distal L foot amp, SILTx4  Assessment & Plan: 65 y.o. man w/ expansion of SDH. Some waxing/waning exam, but on my exam this morning he is neurologically doing well.  -was going to repeat his CT with report of worsening, d/c'd that order since he was doing well on my exam -no change in plan, if he does well then likely evacuation of the SDH next week. If he worsens this weekend, will repeat the scan and see what EEG is showing to determine if evacuation is needed sooner  [place]  01/15/21 10:00 AM

## 2021-01-15 NOTE — Progress Notes (Signed)
Subjective:  Seen and examined No O/N changes acutely Neurology intially consulted for sz on 5/12 Non surgically managed SDH, with waxing/waning mental status Hooked to cEEG to evaluate for intermittent seizures. Overnight EEG reported with no seizures. Diffuse encephalopathy - non specific but could be post-ictal.  ROS: Unable to obtain due to poor mental status  Examination Vital signs in last 24 hours: Temp:  [97.4 F (36.3 C)-99.7 F (37.6 C)] 98.1 F (36.7 C) (05/21 0700) Pulse Rate:  [85-110] 87 (05/21 0600) Resp:  [17-31] 18 (05/21 0600) BP: (106-182)/(72-121) 144/87 (05/21 0600) SpO2:  [98 %-100 %] 100 % (05/21 0733) Weight:  [60.9 kg] 60.9 kg (05/21 0500) General: lying in bed, not in apparent distress CVS: pulse-normal rate and rhythm RS: breathing comfortably, coarse breath sounds bilaterally Extremities: normal, warm Neuro:  Awake, alert, oriented to self, place, not to time, unable to name watch/glasses/pen Able to follow simple one-step commands Repetition intact, poor attention/concentration CN: PERRL, EOM unrestricted, difficult to perform VF assessment but blinks to threat from both sides. Face appears symmetric, tongue palate midline Motor: Nearly symmetric strength in all 4s without a drift.  Sensation: Intact sensation.  Coord: Difficult to assess coordination.  Gait testing deferred.  Basic Metabolic Panel: Recent Labs  Lab 01/12/21 0043 01/12/21 1649 01/13/21 0143 01/14/21 0914 01/14/21 1656 01/15/21 0659  NA 128* 131* 130* 129*  --  131*  K 4.3 4.4 4.5 4.3  --  4.6  CL 98 98 99 98  --  99  CO2 22 22 23 22   --  22  GLUCOSE 68* 82 92 87  --  119*  BUN 7* 9 8 8   --  13  CREATININE 0.88 0.81 0.94 0.90  --  0.96  CALCIUM 9.1 9.3 9.1 9.2  --  9.3  MG 1.9 1.8 1.7 2.0 1.9 1.9  PHOS  --  4.0  --  4.8* 4.4 2.3*    CBC: Recent Labs  Lab 01/10/21 0138 01/11/21 0120 01/12/21 0043 01/12/21 1649 01/13/21 0143 01/14/21 0914 01/15/21 0659  WBC  12.1* 11.5* 13.5* 14.9* 14.5* 11.1* 16.0*  NEUTROABS 8.4* 8.0* 9.6*  --  10.8* 8.1*  --   HGB 7.9* 8.0* 8.0* 8.1* 7.6* 8.2* 8.6*  HCT 23.7* 24.8* 24.9* 25.2* 23.4* 25.4* 27.1*  MCV 99.2 101.2* 102.0* 101.2* 99.2 100.0 98.9  PLT 543* 639* 783* 885* 839* 858* 932*   Imaging No new brain imaging overnight Reviewed last [facility] 01/13/21 Interval increase in size of Lt sided mixed density SDH with 74mm shift  ASSESSMENT AND PLAN:  65 year old male with alcohol use who presented with altered mental status and was found to have left subdural hematoma which did not require any neurosurgical intervention/was on DAPT.  Had generalized tonic-clonic seizure-like episode 5/12 No seizures overnight. Overnight EEG with no seizures. Diffuse non specific vs post ictal encephalopathy  Acute subdural hematomawith right upper extremity hemiparesis Acute symptomatic seizure Acute encephalopathy,worsening Visual hallucinations Alcohol use Thrombocytosis Anemia Leukocytosis Hypoproteinemia with hypoalbuminemia -Acute encephalopathy due to worsening subdural hematoma, seizures, possible infection  Recommendations: -cEEG negative for sz overnight. Can d/c LTM -Evaluated by neurosurgery, started on dexamethasone.  Neurosurgery will consider bur hole evacuation in a few days  -Continue Keppra 500 mg twice daily.   -Seizure precautionsincluding do not drive -as needed IV Ativan 2 mg for clinical seizure-like activity lasting more than 2 minutes -Appreciate close watch by PCCM -Repeat imaging per NSGY - Dr Zada Finders ordered a [facility] stat -surgical management per NSGY  Discussed with Dr Lamonte Sakai on the unit and Dr. Zada Finders.  -- Amie Portland, MD Neurologist [facility] Pager: [phone]
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
## 2021-01-15 NOTE — Procedures (Addendum)
Patient Name:Seth [name] XBJ:[id number] Epilepsy Attending:Ladd Cen [name] Referring Physician/Provider:Dr Zeb Comfort Duration:01/14/2021 2114 to 5/21/20221752  Patient history:65 year old male with alcohol use who presented with altered mental status and was found to have left subdural hematoma which did not require any neurosurgical intervention. Had generalized tonic-clonic seizure-like episode this afternoon.EEG to evaluate for seizure  Level of alertness:lethargic   AEDs during EEG study:LEV  Technical aspects: This EEG study was done with scalp electrodes positioned according to the 10-20 International system of electrode placement. Electrical activity was acquired at a sampling rate of 500Hz  and reviewed with a high frequency filter of 70Hz  and a low frequency filter of 1Hz . EEG data were recorded continuously and digitally stored.   Description:No posterior dominant rhythm was noted. EEGshowed continuous generalizedlow amplitude predominantly 2-3 Hz admixed with intermittent 5-6 Hz theta slowing. Hyperventilation and photic stimulation were not performed.   ABNORMALITY - Continuous slow, generalized  IMPRESSION: This studyissuggestive of moderatediffuse encephalopathy, nonspecific etiology but likely related to postictal state.No seizures ordefiniteepileptiform discharges were seen throughout the recording.  Kathyleen Radice [name]

## 2021-01-15 NOTE — Progress Notes (Signed)
NAME:  EDGE MAUGER, MRN:  [id number], DOB:  1956-05-30, LOS: 84 ADMISSION DATE:  01/01/2021, CONSULTATION DATE:  01/06/2021 REFERRING MD:  Dr. Candiss Norse, CHIEF COMPLAINT:  Seizure followed by brief VT arrest    History of Present Illness:  Seth [name] is a 65 y.o. male with PMH significant for prior stroke, HTN. ETOH abuse, PVD s/p DES to popliteal artery, left foor osteomyelitis, GOU, COPD, tobacco use, and depression who presented with for evaluation of AMS. On visit from family patient was seen with AMS prompting EMS call. Per chart review patient did report he passed out the day before ED arrival which was followed by vomiting.   On ED evaluation patient was seen with hypertension, tachycardia, and mildly elevated temp of 99.3. Head CT revealed left sided SDH with 64mm midline shift. Patient was admitted per [facility] with NSG consult.   Afternoon of 5/12 patient was seen with acute neuro change and later seen with full tonic clonic seizure. HE was administered IV ativan and once seizure activity stopped patient was take emergently to radiology for stat repeat head CT. En route to CT patient was seen with decreased mentation yet again and subsequent development of VT with brief (20-30sec) cardiac arrest. VT spontaneously resolved and patient regained pulses without any chest compressions or medications.   On 5/19 PCCM re-engaged as pt with worsening mental status, worsening subdural hemorrhage and is being transferred to the ICU   Pertinent  Medical History  Prior stroke, HTN. ETOH abuse, PVD s/p DES to popliteal artery, left foor osteomyelitis, GOU, COPD, tobacco use, and depression  Significant Hospital Events: Including procedures, antibiotic start and stop dates in addition to other pertinent events   . 5/7 Admitted for AMS  . 5/12 seizure followed by brief (20-30sec) cardiac arrest. VT spontaneously resolved and patient regained pulses without any chest compressions or medications.  . 5/18  worsening mental status noted by RN-- hallucinations, tremor, increased encephalopathy and somnolence. Put on CIWA protocol. . 5/19 worsening mental status -- repeat CT H acquired revealing increased subdural hematoma, increased mass effect, effacement of basal cisterns on L. Got 2mg  ativan for tremor vs generalized tonic clonic sz. Transferring to the ICU. Neurology, [place] and PCCM called   Interim History / Subjective:   Awake and attempting to interact.  Has been impulsive, try to get out of bed Continuous EEG without any evidence of seizures Tube feeding was started 5/20   Objective   Blood pressure 122/76, pulse 86, temperature 98.1 F (36.7 C), temperature source Oral, resp. rate 18, height 6\' 3"  (1.905 m), weight 60.9 kg, SpO2 100 %.        Intake/Output Summary (Last 24 hours) at 01/15/2021 1136 Last data filed at 01/15/2021 0600 Gross per 24 hour  Intake 943.18 ml  Output 600 ml  Net 343.18 ml   Filed Weights   01/09/21 0558 01/11/21 0414 01/15/21 0500  Weight: 65.2 kg 63.8 kg 60.9 kg    Examination: General: Thin ill-appearing man, laying in bed, no agitation HEENT: Temporal wasting, oropharynx clear, no secretions or stridor Neuro: Awake, attempted to converse, answers questions but poorly oriented.  Still with some residual aphasia CV: Regular, distant, no murmur PULM: Clear bilaterally, decreased at both bases GI: Nondistended, positive bowel sounds Extremities: Left forefoot amputation, no edema Skin: No rash  Labs/imaging that I have personally reviewed    All labs and studies reviewed  Resolved Hospital Problem list    VT arrest (brief arrest 5/12 x  20-30seconds) Assessment & Plan:   Acute Left SDH with mass effect and midline shift , increased in size, increased edema and shift 5/19 -5/19 incr size SDH to 35mm from 79mm, and incr midline shift 59mm from 46mm, incr mass effect  Seizure -None since 5/19.  Continuous EEG reassuring Acute Encephalopathy--  worse 5/19, improving 5/20-21 -in setting of above and metabolic  -mental status reportedly started to decline 5/18 approx 1700-- no imaging at that time, thought possible EtOH w/d. Continued to decline 5/19, neuro consulted by primary and sent for CT H which revealed worsening intracranial process as above  P; -Continue to follow in the ICU.  At risk for decompensation.  Remains very impulsive -Frequent neurochecks -Seizure precautions -Suspect we will be able to discontinue continuous EEG soon -Continue Keppra, steroids as ordered -Ativan as needed for seizures -Appreciate neurosurgery evaluation.  Depending on course it is likely that he will ultimately have SDH evacuation  Hx COPD Suspected aspiration PNA  P: -Plan to discontinue Zosyn, has been on extended course of antibiotics, over 10 days -Follow chest x-ray and respiratory status -Continue albuterol, Breo   Hyponatremia -has been low 130s to high 120s since 5/12 P -Sodium stable, continue to follow  Hypertension P -Metoprolol -Labetalol as needed  Hx EtOH abuse -Patient reports I1/2 -1 pint of vodka per day -has been admitted since 5/7 so probably outside of CIWA window by 5/19 P: -CIWA coverage discontinued, thiamine and folate completed  Hx CVA  P -On aspirin, statin -Plavix on hold given his SDH -Home Plavix is on hold given the SDH  Anemia P: -Hemoglobin goal 7.0  Hx PAD s/p DES to popliteal artery Hx L transmetatarsal amputation, osteomyelitis -Home medications include DAPT therapy with statin and Plavix P: -Holding DAPT -Continue statin   Best practice (right click and "Reselect all SmartList Selections" daily)  Diet:  NPO Pain/Anxiety/Delirium protocol (if indicated): No VAP protocol (if indicated): Not indicated DVT prophylaxis: SCD GI prophylaxis: PPI Glucose control:  SSI No Central venous access:  N/A Arterial line:  N/A Foley:  N/A Mobility:  bed rest  PT consulted: N/A Last date  of multidisciplinary goals of care discussion Pending  Code Status:  full code Disposition: ICU  Labs   CBC: Recent Labs  Lab 01/10/21 0138 01/11/21 0120 01/12/21 0043 01/12/21 1649 01/13/21 0143 01/14/21 0914 01/15/21 0659  WBC 12.1* 11.5* 13.5* 14.9* 14.5* 11.1* 16.0*  NEUTROABS 8.4* 8.0* 9.6*  --  10.8* 8.1*  --   HGB 7.9* 8.0* 8.0* 8.1* 7.6* 8.2* 8.6*  HCT 23.7* 24.8* 24.9* 25.2* 23.4* 25.4* 27.1*  MCV 99.2 101.2* 102.0* 101.2* 99.2 100.0 98.9  PLT 543* 639* 783* 885* 839* 858* 932*    Basic Metabolic Panel: Recent Labs  Lab 01/12/21 0043 01/12/21 1649 01/13/21 0143 01/14/21 0914 01/14/21 1656 01/15/21 0659  NA 128* 131* 130* 129*  --  131*  K 4.3 4.4 4.5 4.3  --  4.6  CL 98 98 99 98  --  99  CO2 22 22 23 22   --  22  GLUCOSE 68* 82 92 87  --  119*  BUN 7* 9 8 8   --  13  CREATININE 0.88 0.81 0.94 0.90  --  0.96  CALCIUM 9.1 9.3 9.1 9.2  --  9.3  MG 1.9 1.8 1.7 2.0 1.9 1.9  PHOS  --  4.0  --  4.8* 4.4 2.3*   GFR: Estimated Creatinine Clearance: 67.8 mL/min (by C-G formula based on SCr of  0.96 mg/dL). Recent Labs  Lab 01/10/21 0138 01/11/21 0120 01/12/21 0043 01/12/21 1649 01/13/21 0143 01/13/21 0208 01/14/21 0914 01/15/21 0659  PROCALCITON <0.10 <0.10  --   --   --  <0.10 <0.10  --   WBC 12.1* 11.5*   < > 14.9* 14.5*  --  11.1* 16.0*   < > = values in this interval not displayed.    Liver Function Tests: Recent Labs  Lab 01/11/21 0120 01/12/21 0043 01/12/21 1649 01/13/21 0143 01/14/21 0914  AST 22 18 20 15 17   ALT 12 10 12 12 10   ALKPHOS 55 52 53 49 54  BILITOT 0.8 0.6 0.5 0.6 0.8  PROT 6.0* 5.9* 6.3* 5.7* 6.3*  ALBUMIN 2.7* 2.6* 2.8* 2.5* 2.7*   No results for input(s): LIPASE, AMYLASE in the last 168 hours. No results for input(s): AMMONIA in the last 168 hours.  ABG    Component Value Date/Time   PHART 7.475 (H) 07/20/2020 0240   PCO2ART 20.6 (L) 07/20/2020 0240   PO2ART 95 07/20/2020 0240   HCO3 15.1 (L) 07/20/2020 0240   TCO2  16 (L) 07/20/2020 0240   ACIDBASEDEF 7.0 (H) 07/20/2020 0240   O2SAT 98.0 07/20/2020 0240     Coagulation Profile: No results for input(s): INR, PROTIME in the last 168 hours.  Cardiac Enzymes: No results for input(s): CKTOTAL, CKMB, CKMBINDEX, TROPONINI in the last 168 hours.  HbA1C: Hgb A1c MFr Bld  Date/Time Value Ref Range Status  07/20/2020 07:41 AM 4.2 (L) 4.8 - 5.6 % Final    Comment:    (NOTE) Pre diabetes:          5.7%-6.4%  Diabetes:              >6.4%  Glycemic control for   <7.0% adults with diabetes     CBG: Recent Labs  Lab 01/14/21 1544 01/14/21 1934 01/14/21 2335 01/15/21 0413 01/15/21 0906  GLUCAP 117* 141* 147* 160* 103*    CRITICAL CARE Performed by: Collene Gobble   Total critical care time: 32 minutes  Critical care time was exclusive of separately billable procedures and treating other patients. Critical care was necessary to treat or prevent imminent or life-threatening deterioration.  Critical care was time spent personally by me on the following activities: development of treatment plan with patient and/or surrogate as well as nursing, discussions with consultants, evaluation of patient's response to treatment, examination of patient, obtaining history from patient or surrogate, ordering and performing treatments and interventions, ordering and review of laboratory studies, ordering and review of radiographic studies, pulse oximetry and re-evaluation of patient's condition.  Baltazar Apo, MD, PhD 01/15/2021, 11:36 AM [facility] [phone] or if no answer before 7:00PM call [phone] For any issues after 7:00PM please call eLink [phone]

## 2021-01-15 NOTE — Progress Notes (Signed)
EEG unhook in process, no skin break seen. 

## 2021-01-16 DIAGNOSIS — S065X9A Traumatic subdural hemorrhage with loss of consciousness of unspecified duration, initial encounter: Secondary | ICD-10-CM | POA: Diagnosis present

## 2021-01-16 DIAGNOSIS — R569 Unspecified convulsions: Secondary | ICD-10-CM | POA: Diagnosis not present

## 2021-01-16 DIAGNOSIS — G9340 Encephalopathy, unspecified: Secondary | ICD-10-CM | POA: Diagnosis not present

## 2021-01-16 DIAGNOSIS — S065XAA Traumatic subdural hemorrhage with loss of consciousness status unknown, initial encounter: Secondary | ICD-10-CM | POA: Diagnosis present

## 2021-01-16 LAB — BASIC METABOLIC PANEL
Anion gap: 10 (ref 5–15)
BUN: 12 mg/dL (ref 8–23)
CO2: 23 mmol/L (ref 22–32)
Calcium: 9.2 mg/dL (ref 8.9–10.3)
Chloride: 101 mmol/L (ref 98–111)
Creatinine, Ser: 0.83 mg/dL (ref 0.61–1.24)
GFR, Estimated: >60 mL/min (ref >60)
Glucose, Bld: 127 mg/dL — ABNORMAL HIGH (ref 70–99)
Potassium: 4.2 mmol/L (ref 3.5–5.1)
Sodium: 134 mmol/L — ABNORMAL LOW (ref 135–145)

## 2021-01-16 LAB — GLUCOSE, CAPILLARY
Glucose-Capillary: 119 mg/dL — ABNORMAL HIGH (ref 70–99)
Glucose-Capillary: 123 mg/dL — ABNORMAL HIGH (ref 70–99)
Glucose-Capillary: 126 mg/dL — ABNORMAL HIGH (ref 70–99)
Glucose-Capillary: 129 mg/dL — ABNORMAL HIGH (ref 70–99)
Glucose-Capillary: 139 mg/dL — ABNORMAL HIGH (ref 70–99)
Glucose-Capillary: 146 mg/dL — ABNORMAL HIGH (ref 70–99)
Glucose-Capillary: 149 mg/dL — ABNORMAL HIGH (ref 70–99)
Glucose-Capillary: 82 mg/dL (ref 70–99)

## 2021-01-16 LAB — CBC
HCT: 25.2 % — ABNORMAL LOW (ref 39.0–52.0)
Hemoglobin: 8.1 g/dL — ABNORMAL LOW (ref 13.0–17.0)
MCH: 31.9 pg (ref 26.0–34.0)
MCHC: 32.1 g/dL (ref 30.0–36.0)
MCV: 99.2 fL (ref 80.0–100.0)
Platelets: 980 10*3/uL (ref 150–400)
RBC: 2.54 MIL/uL — ABNORMAL LOW (ref 4.22–5.81)
RDW: 17.1 % — ABNORMAL HIGH (ref 11.5–15.5)
WBC: 15.3 10*3/uL — ABNORMAL HIGH (ref 4.0–10.5)
nRBC: 0 % (ref 0.0–0.2)

## 2021-01-16 LAB — MAGNESIUM: Magnesium: 2 mg/dL (ref 1.7–2.4)

## 2021-01-16 MED ORDER — SODIUM CHLORIDE 0.9 % IV SOLN
INTRAVENOUS | Status: DC | PRN
  Administered 2021-01-16: 250 mL via INTRAVENOUS

## 2021-01-16 NOTE — Progress Notes (Signed)
  Speech Language Pathology Treatment: Dysphagia  Patient Details Name: Seth [name] MRN: [id number] DOB: October 25, 1955 Today's Date: 01/16/2021 Time: 1540-1600 SLP Time Calculation (min) (ACUTE ONLY): 20 min  Assessment / Plan / Recommendation Clinical Impression  Patient seen for PO trials to determine readiness to start back on oral diet. Of note, patient significantly more alert, attentive and does not appear as confused as he was previous date. He was able to hold cup to take cup sips of thin liquids and nectar thick liquids and did not exhibit any overt s/s of aspiration or penetration even with large successive sips. He was able to feed himself cup of diced canned peaches but did have instances of spilling which appeared to be secondary to decreased fine motor control. At this time, SLP is recommending to start patient back on Dys 2 solids, thin liquids.   HPI HPI: Seth [name] is a 65 y.o. male  presents with Acute mental status decline - likely on by alcohol abuse, poor oral intake causing hypoglycemia, hypomagnesemia, dehydration, AKI and hyperkalemia, with possible DTs. CT showing SDH. PMH significant for history of CVA, PVD s/p LLE angiography with DES placed to popliteal artery, left foot osteomyelitis s/p transmetatarsal hypertension 09/22/2020, COPD, hypertension, alcohol use, and tobacco use. Patient had been seen by ST services and was placed on and tolerating Dys 2 solids, thin liquids diet with most recent SLP intervention on 5/18. On 5/19, patient exhibiting change in cognitive status and repeat CT revealed SDH increased in size from 20mm to 73mm. Patient transferred to ICU, made NPO and Cortrak placed for nutrition. Repeat BSE ordered secondary to change in status to reasses swallow function.      SLP Plan  Continue with current plan of care       Recommendations  Diet recommendations: Dysphagia 2 (fine chop);Thin liquid Liquids provided via: Cup;Straw Medication  Administration: Whole meds with puree Supervision: Intermittent supervision to cue for compensatory strategies;Staff to assist with self feeding Compensations: Slow rate;Small sips/bites;Follow solids with liquid Postural Changes and/or Swallow Maneuvers: Seated upright 90 degrees                Oral Care Recommendations: Oral care BID;Staff/trained caregiver to provide oral care Follow up Recommendations: 24 hour supervision/assistance;Skilled Nursing facility SLP Visit Diagnosis: Dysphagia, unspecified (R13.10) Plan: Continue with current plan of care       [place], MA, [facility]

## 2021-01-16 NOTE — Progress Notes (Signed)
NAME:  Seth [name], MRN:  [id number], DOB:  [birth year], LOS: 40 ADMISSION DATE:  01/01/2021, CONSULTATION DATE:  01/06/2021 REFERRING MD:  Dr. Candiss Norse, CHIEF COMPLAINT:  Seizure followed by brief VT arrest    History of Present Illness:  Seth [name] is a 65 y.o. male with PMH significant for prior stroke, HTN. ETOH abuse, PVD s/p DES to popliteal artery, left foor osteomyelitis, GOU, COPD, tobacco use, and depression who presented with for evaluation of AMS. On visit from family patient was seen with AMS prompting EMS call. Per chart review patient did report he passed out the day before ED arrival which was followed by vomiting.   On ED evaluation patient was seen with hypertension, tachycardia, and mildly elevated temp of 99.3. Head CT revealed left sided SDH with 35mm midline shift. Patient was admitted per [facility] with NSG consult.   Afternoon of 5/12 patient was seen with acute neuro change and later seen with full tonic clonic seizure. HE was administered IV ativan and once seizure activity stopped patient was take emergently to radiology for stat repeat head CT. En route to CT patient was seen with decreased mentation yet again and subsequent development of VT with brief (20-30sec) cardiac arrest. VT spontaneously resolved and patient regained pulses without any chest compressions or medications.   On 5/19 PCCM re-engaged as pt with worsening mental status, worsening subdural hemorrhage and is being transferred to the ICU   Pertinent  Medical History  Prior stroke, HTN. ETOH abuse, PVD s/p DES to popliteal artery, left foor osteomyelitis, GOU, COPD, tobacco use, and depression  Significant Hospital Events: Including procedures, antibiotic start and stop dates in addition to other pertinent events   . 5/7 Admitted for AMS  . 5/12 seizure followed by brief (20-30sec) cardiac arrest. VT spontaneously resolved and patient regained pulses without any chest compressions or medications.  . 5/18  worsening mental status noted by RN-- hallucinations, tremor, increased encephalopathy and somnolence. Put on CIWA protocol. . 5/19 worsening mental status -- repeat CT H acquired revealing increased subdural hematoma, increased mass effect, effacement of basal cisterns on L. Got 2mg  ativan for tremor vs generalized tonic clonic sz. Transferring to the ICU. Neurology, [place] and PCCM called   Interim History / Subjective:   More awake but impulsive, has required safety sitter EEG discontinued 5/21 (no seizures for 3 days) Persistent thrombocytosis I/O- 6.8 L total Required I&O cath x2 on 5/21   Objective   Blood pressure 139/84, pulse 89, temperature 98.2 F (36.8 C), temperature source Oral, resp. rate 19, height 6\' 3"  (1.905 m), weight 60.9 kg, SpO2 100 %.        Intake/Output Summary (Last 24 hours) at 01/16/2021 0749 Last data filed at 01/16/2021 0600 Gross per 24 hour  Intake 1530 ml  Output 925 ml  Net 605 ml   Filed Weights   01/11/21 0414 01/15/21 0500 01/16/21 0500  Weight: 63.8 kg 60.9 kg 60.9 kg    Examination: General: Thin, chronically ill-appearing, no distress mitten restraints still in place HEENT: Temporal wasting, no upper airway noise, stronger voice, no stridor Neuro: Awake, interacting and answering questions, no evidence of aphasia, still oriented only to self, moves extremities CV: Regular, distant, no murmur PULM: Distant but clear bilaterally GI: Nondistended, positive bowel sounds Extremities: Left forefoot amputation, no edema Skin: No rash  Labs/imaging that I have personally reviewed    All labs and studies reviewed 5/22  Resolved Hospital Problem list    VT  arrest (brief arrest 5/12 x 20-30seconds) Assessment & Plan:   Acute Left SDH with mass effect and midline shift , increased in size, increased edema and shift 5/19 -5/19 incr size SDH to 31mm from 62mm, and incr midline shift 89mm from 47mm, incr mass effect  Seizure -None since 5/19.   Continuous EEG reassuring Acute Encephalopathy-- worse 5/19, improving 5/20-21 -in setting of above and metabolic  -mental status reportedly started to decline 5/18 approx 1700-- no imaging at that time, thought possible EtOH w/d. Continued to decline 5/19, neuro consulted by primary and sent for CT H which revealed worsening intracranial process as above  P; -We will follow for another day in the ICU pending neurosurgery eval and decision regarding whether he will require elective evacuation. -Appreciate neurosurgery and neurology input -Seizure precautions and frequent neurochecks -Continue Keppra and steroids as ordered -Ativan ordered if needed for seizures -Should be okay for him to get up to chair with assistance  Hx COPD Suspected aspiration PNA  P: -Following off antibiotics -Continue albuterol, Breo -Follow intermittent chest x-ray and respiratory status, comfortable on room air  Hyponatremia -has been low 130s to high 120s since 5/12 P -Sodium stable, following  Hypertension P -Continue metoprolol -Continue labetalol as needed  Hx EtOH abuse -Patient reports I1/2 -1 pint of vodka per day -has been admitted since 5/7 so probably outside of CIWA window by 5/19 P: -CIWA coverage discontinued, thiamine and folate completed  Hx CVA  P -On aspirin, statin -Plavix on hold given his SDH  Anemia P: -Hemoglobin goal 7.0  Hx PAD s/p DES to popliteal artery Hx L transmetatarsal amputation, osteomyelitis -Home medications include DAPT therapy with statin and Plavix P: -DAPT held as above -Continue statin   Best practice (right click and "Reselect all SmartList Selections" daily)  Diet:  NPO SLP following Pain/Anxiety/Delirium protocol (if indicated): No VAP protocol (if indicated): Not indicated DVT prophylaxis: SCD GI prophylaxis: PPI Glucose control:  SSI No Central venous access:  N/A Arterial line:  N/A Foley:  N/A Mobility:  bed rest  PT consulted:  N/A Last date of multidisciplinary goals of care discussion Pending  Code Status:  full code Disposition: ICU  Labs   CBC: Recent Labs  Lab 01/10/21 0138 01/11/21 0120 01/12/21 0043 01/12/21 1649 01/13/21 0143 01/14/21 0914 01/15/21 0659 01/16/21 0040  WBC 12.1* 11.5* 13.5* 14.9* 14.5* 11.1* 16.0* 15.3*  NEUTROABS 8.4* 8.0* 9.6*  --  10.8* 8.1*  --   --   HGB 7.9* 8.0* 8.0* 8.1* 7.6* 8.2* 8.6* 8.1*  HCT 23.7* 24.8* 24.9* 25.2* 23.4* 25.4* 27.1* 25.2*  MCV 99.2 101.2* 102.0* 101.2* 99.2 100.0 98.9 99.2  PLT 543* 639* 783* 885* 839* 858* 932* 980*    Basic Metabolic Panel: Recent Labs  Lab 01/12/21 1649 01/13/21 0143 01/14/21 0914 01/14/21 1656 01/15/21 0659 01/16/21 0040  NA 131* 130* 129*  --  131* 134*  K 4.4 4.5 4.3  --  4.6 4.2  CL 98 99 98  --  99 101  CO2 22 23 22   --  22 23  GLUCOSE 82 92 87  --  119* 127*  BUN 9 8 8   --  13 12  CREATININE 0.81 0.94 0.90  --  0.96 0.83  CALCIUM 9.3 9.1 9.2  --  9.3 9.2  MG 1.8 1.7 2.0 1.9 1.9 2.0  PHOS 4.0  --  4.8* 4.4 2.3*  --    GFR: Estimated Creatinine Clearance: 78.5 mL/min (by C-G formula based  on SCr of 0.83 mg/dL). Recent Labs  Lab 01/10/21 0138 01/11/21 0120 01/12/21 0043 01/13/21 0143 01/13/21 0208 01/14/21 0914 01/15/21 0659 01/16/21 0040  PROCALCITON <0.10 <0.10  --   --  <0.10 <0.10  --   --   WBC 12.1* 11.5*   < > 14.5*  --  11.1* 16.0* 15.3*   < > = values in this interval not displayed.    Liver Function Tests: Recent Labs  Lab 01/11/21 0120 01/12/21 0043 01/12/21 1649 01/13/21 0143 01/14/21 0914  AST 22 18 20 15 17   ALT 12 10 12 12 10   ALKPHOS 55 52 53 49 54  BILITOT 0.8 0.6 0.5 0.6 0.8  PROT 6.0* 5.9* 6.3* 5.7* 6.3*  ALBUMIN 2.7* 2.6* 2.8* 2.5* 2.7*   No results for input(s): LIPASE, AMYLASE in the last 168 hours. No results for input(s): AMMONIA in the last 168 hours.  ABG    Component Value Date/Time   PHART 7.475 (H) 07/20/2020 0240   PCO2ART 20.6 (L) 07/20/2020 0240    PO2ART 95 07/20/2020 0240   HCO3 15.1 (L) 07/20/2020 0240   TCO2 16 (L) 07/20/2020 0240   ACIDBASEDEF 7.0 (H) 07/20/2020 0240   O2SAT 98.0 07/20/2020 0240     Coagulation Profile: No results for input(s): INR, PROTIME in the last 168 hours.  Cardiac Enzymes: No results for input(s): CKTOTAL, CKMB, CKMBINDEX, TROPONINI in the last 168 hours.  HbA1C: Hgb A1c MFr Bld  Date/Time Value Ref Range Status  07/20/2020 07:41 AM 4.2 (L) 4.8 - 5.6 % Final    Comment:    (NOTE) Pre diabetes:          5.7%-6.4%  Diabetes:              >6.4%  Glycemic control for   <7.0% adults with diabetes     CBG: Recent Labs  Lab 01/15/21 1151 01/15/21 1546 01/15/21 1942 01/16/21 0016 01/16/21 0403  GLUCAP 117* 130* 149* 129* 119*    CRITICAL CARE Performed by: Collene Gobble   Total critical care time: 31 minutes  Critical care time was exclusive of separately billable procedures and treating other patients. Critical care was necessary to treat or prevent imminent or life-threatening deterioration.  Critical care was time spent personally by me on the following activities: development of treatment plan with patient and/or surrogate as well as nursing, discussions with consultants, evaluation of patient's response to treatment, examination of patient, obtaining history from patient or surrogate, ordering and performing treatments and interventions, ordering and review of laboratory studies, ordering and review of radiographic studies, pulse oximetry and re-evaluation of patient's condition.  Baltazar Apo, MD, PhD 01/16/2021, 7:49 AM [facility] [phone] or if no answer before 7:00PM call [phone] For any issues after 7:00PM please call eLink [phone]

## 2021-01-16 NOTE — Progress Notes (Signed)
[facility] Progress Note Patient Name: Seth [name] DOB: Mar 11, 1956 MRN: [id number]   Date of Service  01/16/2021  HPI/Events of Note  Plt slowly going up , now 980K, Hg stable at 8.1 Sz/AMS. Chr SDH.sodium 131. No signs of DI, dehydration.    eICU Interventions  Continue care. Neuro to follow up. If worsens, hematology opinion for thrombocytosis, mostly reactive phase.         Elmer Sow 01/16/2021, 2:02 AM

## 2021-01-16 NOTE — Progress Notes (Signed)
Neurosurgery Service Progress Note  Subjective: No acute events overnight  Objective: Vitals:   01/16/21 1400 01/16/21 1500 01/16/21 1600 01/16/21 1700  BP: 133/83 137/69 (!) 142/79 (!) 156/90  Pulse: 84 81 84 80  Resp: 17 16 (!) 30 20  Temp:  98.1 F (36.7 C)    TempSrc:  Oral    SpO2: 99% 100% 100% 100%  Weight:      Height:        Physical Exam: AOx2 (thinks it's 2020), PERRL, EOMI, FS, TM Interactive with normal affect Strength 5/5 x4 except obviously limited by distal L foot amp, SILTx4  Assessment & Plan: 65 y.o. man w/ expansion of SDH. Some waxing/waning exam, but on my exam this morning he is neurologically doing well.  -neurologically doing well, no change in plan  Judith Part  01/16/21 6:21 PM

## 2021-01-16 NOTE — Progress Notes (Signed)
Date and time results received: 01/16/21 0207  Test: Platelets Critical Value: 980  Name of Provider Notified: Prudencio Burly, MD  Orders Received? Or Actions Taken?: Awaiting orders.

## 2021-01-16 NOTE — Progress Notes (Signed)
[facility] Progress Note Patient Name: Seth [name] DOB: 21-Mar-1956 MRN: [id number]   Date of Service  01/16/2021  HPI/Events of Note  1:1 sitter renewal request for encephalopathy, chr SDH.    eICU Interventions  Re ordered. AM team to review again.     eICU Interventions       Intervention Category Minor Interventions: Other:  Elmer Sow 01/16/2021, 8:06 PM

## 2021-01-16 NOTE — Progress Notes (Signed)
Pt UOP decreased over shift. Bladder scan performed, result 39mL. ELink notified. No new orders at this time. Will continue to monitor.

## 2021-01-16 NOTE — Progress Notes (Signed)
[facility] Progress Note Patient Name: Seth [name] DOB: [birth year] MRN: [id number]   Date of Service  01/16/2021  HPI/Events of Note  1:1 sitter renewal request for encephalopathy, chr SDH.   eICU Interventions  Re ordered. AM team to review again.      Intervention Category Minor Interventions: Other:  Elmer Sow 01/16/2021, 4:33 AM

## 2021-01-16 NOTE — Progress Notes (Signed)
Subjective:  Seen and examined No O/N changes acutely Neurology intially consulted for sz on 5/12 Non surgically managed SDH, with waxing/waning mental status Hooked to cEEG to evaluate for intermittent seizures. Overnight EEG reported with no seizures. Diffuse encephalopathy - non specific but could be post-ictal. Has been off of EEG since yesterday  ROS: Unable to obtain due to poor mental status  Examination Vital signs in last 24 hours: Temp:  [97.5 F (36.4 C)-98.2 F (36.8 C)] 98 F (36.7 C) (05/22 0700) Pulse Rate:  [69-89] 89 (05/22 0000) Resp:  [14-31] 18 (05/22 0800) BP: (72-144)/(48-95) 130/80 (05/22 0800) SpO2:  [95 %-100 %] 97 % (05/22 0802) Weight:  [60.9 kg] 60.9 kg (05/22 0500) General: lying in bed, not in apparent distress CVS: pulse-normal rate and rhythm RS: breathing comfortably, coarse breath sounds bilaterally Extremities: normal, warm Neuro:  Awake, alert, oriented to self, place, not to time. Able to follow simple one-step commands Repetition intact, poor attention/concentration CN: PERRL, EOM unrestricted, difficult to perform VF assessment but blinks to threat from both sides. Face appears symmetric, tongue palate midline Motor: Nearly symmetric strength in all 4s without a drift.  Sensation: Intact sensation.  Coord: Difficult to assess coordination.  Gait testing deferred.  Basic Metabolic Panel: Recent Labs  Lab 01/12/21 1649 01/13/21 0143 01/14/21 0914 01/14/21 1656 01/15/21 0659 01/16/21 0040  NA 131* 130* 129*  --  131* 134*  K 4.4 4.5 4.3  --  4.6 4.2  CL 98 99 98  --  99 101  CO2 22 23 22   --  22 23  GLUCOSE 82 92 87  --  119* 127*  BUN 9 8 8   --  13 12  CREATININE 0.81 0.94 0.90  --  0.96 0.83  CALCIUM 9.3 9.1 9.2  --  9.3 9.2  MG 1.8 1.7 2.0 1.9 1.9 2.0  PHOS 4.0  --  4.8* 4.4 2.3*  --     CBC: Recent Labs  Lab 01/10/21 0138 01/11/21 0120 01/12/21 0043 01/12/21 1649 01/13/21 0143 01/14/21 0914 01/15/21 0659  01/16/21 0040  WBC 12.1* 11.5* 13.5* 14.9* 14.5* 11.1* 16.0* 15.3*  NEUTROABS 8.4* 8.0* 9.6*  --  10.8* 8.1*  --   --   HGB 7.9* 8.0* 8.0* 8.1* 7.6* 8.2* 8.6* 8.1*  HCT 23.7* 24.8* 24.9* 25.2* 23.4* 25.4* 27.1* 25.2*  MCV 99.2 101.2* 102.0* 101.2* 99.2 100.0 98.9 99.2  PLT 543* 639* 783* 885* 839* 858* 932* 980*   Imaging No new brain imaging overnight Reviewed last [facility] 01/13/21 Interval increase in size of Lt sided mixed density SDH with 67mm shift  ASSESSMENT AND PLAN:  65 year old male with alcohol use who presented with altered mental status and was found to have left subdural hematoma which did not require any neurosurgical intervention/was on DAPT.  Had generalized tonic-clonic seizure-like episode 5/12 No seizures overnight. Overnight EEG with no seizures. Diffuse non specific vs post ictal encephalopathy  Acute subdural hematomawith right upper extremity hemiparesis Acute symptomatic seizure Acute encephalopathy,worsening Visual hallucinations Alcohol use Thrombocytosis Anemia Leukocytosis Hypoproteinemia with hypoalbuminemia -Acute encephalopathy due to worsening subdural hematoma, seizures, possible infection  Recommendations: -Evaluated by neurosurgery, started on dexamethasone.  Neurosurgery will consider bur hole evacuation in a few days  -Continue Keppra 500 mg twice daily.   -Seizure precautionsincluding do not drive -as needed IV Ativan 2 mg for clinical seizure-like activity lasting more than 2 minutes -Appreciate close watch by PCCM -Repeat imaging and subdural management per neurosurgery.  Neurology will be  available as needed.  Please call with questions.  Discussed with Dr Lamonte Sakai  -- Amie Portland, MD Neurologist [facility] Pager: [phone]

## 2021-01-17 ENCOUNTER — Encounter (HOSPITAL_COMMUNITY): Payer: Self-pay | Admitting: Internal Medicine

## 2021-01-17 DIAGNOSIS — R6510 Systemic inflammatory response syndrome (SIRS) of non-infectious origin without acute organ dysfunction: Secondary | ICD-10-CM | POA: Diagnosis not present

## 2021-01-17 DIAGNOSIS — G9340 Encephalopathy, unspecified: Secondary | ICD-10-CM | POA: Diagnosis not present

## 2021-01-17 DIAGNOSIS — S065X9A Traumatic subdural hemorrhage with loss of consciousness of unspecified duration, initial encounter: Secondary | ICD-10-CM | POA: Diagnosis not present

## 2021-01-17 LAB — PATHOLOGIST SMEAR REVIEW

## 2021-01-17 LAB — BASIC METABOLIC PANEL
Anion gap: 9 (ref 5–15)
BUN: 13 mg/dL (ref 8–23)
CO2: 22 mmol/L (ref 22–32)
Calcium: 9 mg/dL (ref 8.9–10.3)
Chloride: 103 mmol/L (ref 98–111)
Creatinine, Ser: 0.8 mg/dL (ref 0.61–1.24)
GFR, Estimated: >60 mL/min (ref >60)
Glucose, Bld: 103 mg/dL — ABNORMAL HIGH (ref 70–99)
Potassium: 4.4 mmol/L (ref 3.5–5.1)
Sodium: 134 mmol/L — ABNORMAL LOW (ref 135–145)

## 2021-01-17 LAB — CULTURE, BLOOD (ROUTINE X 2)
Culture: NO GROWTH
Culture: NO GROWTH

## 2021-01-17 LAB — CBC
HCT: 25.2 % — ABNORMAL LOW (ref 39.0–52.0)
Hemoglobin: 8.1 g/dL — ABNORMAL LOW (ref 13.0–17.0)
MCH: 31.9 pg (ref 26.0–34.0)
MCHC: 32.1 g/dL (ref 30.0–36.0)
MCV: 99.2 fL (ref 80.0–100.0)
Platelets: 1123 10*3/uL (ref 150–400)
RBC: 2.54 MIL/uL — ABNORMAL LOW (ref 4.22–5.81)
RDW: 17 % — ABNORMAL HIGH (ref 11.5–15.5)
WBC: 16.3 10*3/uL — ABNORMAL HIGH (ref 4.0–10.5)
nRBC: 0.2 % (ref 0.0–0.2)

## 2021-01-17 LAB — GLUCOSE, CAPILLARY
Glucose-Capillary: 106 mg/dL — ABNORMAL HIGH (ref 70–99)
Glucose-Capillary: 108 mg/dL — ABNORMAL HIGH (ref 70–99)
Glucose-Capillary: 113 mg/dL — ABNORMAL HIGH (ref 70–99)
Glucose-Capillary: 113 mg/dL — ABNORMAL HIGH (ref 70–99)
Glucose-Capillary: 134 mg/dL — ABNORMAL HIGH (ref 70–99)
Glucose-Capillary: 94 mg/dL (ref 70–99)

## 2021-01-17 LAB — PROTIME-INR
INR: 1 (ref 0.8–1.2)
Prothrombin Time: 13.3 seconds (ref 11.4–15.2)

## 2021-01-17 MED ORDER — FERROUS SULFATE 325 (65 FE) MG PO TABS
325.0000 mg | ORAL_TABLET | Freq: Two times a day (BID) | ORAL | Status: DC
  Administered 2021-01-17 – 2021-01-20 (×7): 325 mg via ORAL
  Filled 2021-01-17 (×6): qty 1

## 2021-01-17 MED ORDER — ADULT MULTIVITAMIN W/MINERALS CH
1.0000 | ORAL_TABLET | Freq: Every day | ORAL | Status: DC
  Administered 2021-01-17 – 2021-01-22 (×6): 1 via ORAL
  Filled 2021-01-17 (×6): qty 1

## 2021-01-17 MED ORDER — PANTOPRAZOLE SODIUM 40 MG PO TBEC
40.0000 mg | DELAYED_RELEASE_TABLET | Freq: Every day | ORAL | Status: DC
  Administered 2021-01-17 – 2021-01-22 (×6): 40 mg via ORAL
  Filled 2021-01-17 (×6): qty 1

## 2021-01-17 MED ORDER — ASPIRIN 81 MG PO CHEW
81.0000 mg | CHEWABLE_TABLET | Freq: Every day | ORAL | Status: DC
  Administered 2021-01-17 – 2021-01-22 (×6): 81 mg via ORAL
  Filled 2021-01-17 (×7): qty 1

## 2021-01-17 MED ORDER — METOPROLOL TARTRATE 100 MG PO TABS
100.0000 mg | ORAL_TABLET | Freq: Two times a day (BID) | ORAL | Status: DC
  Administered 2021-01-17 – 2021-01-22 (×11): 100 mg via ORAL
  Filled 2021-01-17: qty 2
  Filled 2021-01-17 (×3): qty 1
  Filled 2021-01-17: qty 2
  Filled 2021-01-17 (×3): qty 1
  Filled 2021-01-17 (×3): qty 2

## 2021-01-17 MED ORDER — VITAMIN B-12 1000 MCG PO TABS
1000.0000 ug | ORAL_TABLET | Freq: Every day | ORAL | Status: DC
  Administered 2021-01-17 – 2021-01-22 (×6): 1000 ug via ORAL
  Filled 2021-01-17 (×6): qty 1

## 2021-01-17 MED ORDER — ENSURE ENLIVE PO LIQD
237.0000 mL | Freq: Three times a day (TID) | ORAL | Status: DC
  Administered 2021-01-17 – 2021-01-21 (×10): 237 mL via ORAL
  Filled 2021-01-17: qty 237

## 2021-01-17 MED ORDER — ROSUVASTATIN CALCIUM 5 MG PO TABS
10.0000 mg | ORAL_TABLET | Freq: Every day | ORAL | Status: DC
  Administered 2021-01-17 – 2021-01-22 (×6): 10 mg via ORAL
  Filled 2021-01-17: qty 2
  Filled 2021-01-17: qty 1
  Filled 2021-01-17: qty 2
  Filled 2021-01-17 (×2): qty 1
  Filled 2021-01-17: qty 2

## 2021-01-17 NOTE — Progress Notes (Signed)
Neurosurgery Service Progress Note  Subjective: No acute events overnight, got out of bed this morning  Objective: Vitals:   01/17/21 0508 01/17/21 0531 01/17/21 0611 01/17/21 0700  BP: (!) 162/85 132/75 132/82 136/75  Pulse: 80 83 86 84  Resp: 11 15 17 18   Temp:    98.1 F (36.7 C)  TempSrc:    Oral  SpO2: 100% 100% 100% 100%  Weight:      Height:        Physical Exam: AOx3 (improved), PERRL, EOMI, FS, TM Interactive with normal affect Strength 5/5 x4 except obviously limited by distal L foot amp, SILTx4, no drift  Assessment & Plan: 65 y.o. man w/ expansion of SDH. Some waxing/waning exam but steady improvement for the past few days.  -neurologically doing surprisingly well. Seems like he may have been having seizures and, now that they're treated, he has improved. Dr. Saintclair Halsted will be back tomorrow, will defer to him on timing of repeat CT and the need for drainage of the patient's subdural  Seth [name]  01/17/21 7:47 AM

## 2021-01-17 NOTE — Consult Note (Addendum)
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 Electronically Signed   By: Maurine Simmering   On: 01/10/2021 08:22   DG Chest Port 1 View  Result Date: 01/08/2021 CLINICAL DATA:  Shob/ Altered mental status Evaluate edema. pleural effusion. EXAM: PORTABLE CHEST - 1 VIEW COMPARISON:  01/06/2021 FINDINGS: Interstitial opacities at the right lung base are more conspicuous than on prior study. Left lung remains clear. Heart size and mediastinal contours are within normal limits. No effusion.  No pneumothorax. Cervical fixation hardware partially visualized. IMPRESSION: 1. Developing infiltrate or atelectasis at the right lung base. Electronically Signed   By: Lucrezia Europe M.D.   On: 01/08/2021 10:49   DG Chest Port 1 View  Result Date: 01/06/2021 CLINICAL DATA:  Shortness of breath, altered mental status EXAM: PORTABLE CHEST 1 VIEW COMPARISON:  01/05/2021 FINDINGS: Mild elevation of the right hemidiaphragm. No consolidation or edema. No significant pleural effusion. Stable blunting of the right costophrenic angle. Stable cardiomediastinal contours. IMPRESSION: No acute process in the chest. Electronically Signed   By: Macy Mis M.D.   On: 01/06/2021 08:25    DG Chest Port 1 View  Result Date: 01/05/2021 CLINICAL DATA:  Shortness of breath EXAM: PORTABLE CHEST 1 VIEW COMPARISON:  01/04/2021 FINDINGS: Heart and mediastinal contours are within normal limits. No focal opacities or effusions. No acute bony abnormality. IMPRESSION: No active disease. Electronically Signed   By: Rolm Baptise M.D.   On: 01/05/2021 08:28   DG CHEST PORT 1 VIEW  Result Date: 01/04/2021 CLINICAL DATA:  Cough and fevers EXAM: PORTABLE CHEST 1 VIEW COMPARISON:  01/01/2021, 09/19/2020 FINDINGS: Cardiac shadow is within normal limits. The lungs are well aerated bilaterally. Chronic blunting of the right costophrenic angle is noted stable from prior exams. No focal infiltrate is seen. No bony abnormality is noted. IMPRESSION: No active disease. Electronically Signed   By: Inez Catalina M.D.   On: 01/04/2021 22:35   DG Abd Portable 1V  Result Date: 01/14/2021 CLINICAL DATA:  Cortrak placement EXAM: PORTABLE ABDOMEN - 1 VIEW COMPARISON:  None. FINDINGS: Air-filled small and large bowel loops seen within the upper abdomen. The right hemidiaphragm is elevated. Nasogastric tube terminates in the region of the gastric body. IMPRESSION: Nasogastric tube terminates in the region of the gastric body. Electronically Signed   By: Miachel Roux M.D.   On: 01/14/2021 13:27   EEG adult  Result Date: 01/06/2021 Lora Havens, MD     01/06/2021  3:09 PM Patient Name: Seth [name] MRN: [id number] Epilepsy Attending: Lora Havens Referring Physician/Provider: Dr [name] Date: 01/06/2021 Duration: 23.31 mins Patient history: 65 year old male with alcohol use who presented with altered mental status and was found to have left subdural hematoma which did not require any neurosurgical intervention. Had generalized tonic-clonic seizure-like episode this afternoon. EEG to evaluate for seizure Level of alertness: lethargic AEDs during EEG study: Ativan Technical aspects: This EEG study was done  with scalp electrodes positioned according to the 10-20 International system of electrode placement. Electrical activity was acquired at a sampling rate of _0  and reviewed with a high frequency filter of _1  and a low frequency filter of _2 . EEG data were recorded continuously and digitally stored. Description: EEG showed continuous generalized low amplitude predominantly 2-3 Hz admixed with intermittent 5- 6 Hz theta slowing.   Hyperventilation and photic stimulation were not performed.   ABNORMALITY - Continuous slow, generalized IMPRESSION: This study is suggestive of moderate to severe diffuse encephalopathy, nonspecific etiology. No seizures or epileptiform  discharges were seen throughout the recording. Priyanka [name]   Overnight EEG with video  Result Date: 01/07/2021 Lora Havens, MD     01/07/2021  1:04 PM Patient Name: Seth [name] MRN: [id number] Epilepsy Attending: Lora Havens Referring Physician/Provider: Dr Zeb Comfort Duration: 01/06/2021 1447 to 01/07/2021 1136   Patient history: 65 year old male with alcohol use who presented with altered mental status and was found to have left subdural hematoma which did not require any neurosurgical intervention. Had generalized tonic-clonic seizure-like episode this afternoon. EEG to evaluate for seizure   Level of alertness: lethargic   AEDs during EEG study: Ativan, LEV   Technical aspects: This EEG study was done with scalp electrodes positioned according to the 10-20 International system of electrode placement. Electrical activity was acquired at a sampling rate of _0  and reviewed with a high frequency filter of _1  and a low frequency filter of _2 . EEG data were recorded continuously and digitally stored.   Description: The posterior dominant rhythm consists of 8 Hz activity of moderate voltage (25-35 uV) seen predominantly in posterior head regions, symmetric and reactive to eye opening and eye closing. Sleep was characterized by  vertex waves, sleep spindles (12 to 14 Hz), maximal frontocentral region. EEG initially showed continuous generalized low amplitude predominantly 2-3 Hz admixed with intermittent 5- 6 Hz theta slowing which gradually improved to intermittent 3-_3  theta-delta slowing. Sharp transients were seen in left fronto-centro-temporal region. Hyperventilation and photic stimulation were not performed.     ABNORMALITY - Continuous slow, generalized - Intermittent slow, generalized   IMPRESSION: This study was initially suggestive of moderate diffuse encephalopathy which gradually improved to mild diffuse encephalopathy, nonspecific etiology but likely related to postictal state. No seizures or definite epileptiform discharges were seen throughout the recording.   Lora Havens    ECHOCARDIOGRAM COMPLETE  Result Date: 01/07/2021    ECHOCARDIOGRAM REPORT   Patient Name:   Seth [name] Date of Exam: 01/07/2021 Medical Rec #:  [id number]       Height:       75.0 in Accession #:    [id number]      Weight:       138.0 lb Date of Birth:  Feb 15, 1956        BSA:          1.874 m Patient Age:    26 years        BP:           156/99 mmHg Patient Gender: M               HR:           96 bpm. Exam Location:  Inpatient Procedure: 2D Echo, Cardiac Doppler and Color Doppler Indications:    Cardiac arrest  History:        Patient has prior history of Echocardiogram examinations, most                 recent 09/20/2019. COPD; Risk Factors:Hypertension.  Sonographer:    Cammy Brochure Referring Phys: [phone] [facility] F DAVIS  Sonographer Comments: Image acquisition challenging due to uncooperative patient and Image acquisition challenging due to respiratory motion. IMPRESSIONS  1. Left ventricular ejection fraction, by estimation, is 60 to 65%. The left ventricle has normal function. The left ventricle has no regional wall motion abnormalities. There is mild left ventricular hypertrophy. Left ventricular diastolic parameters are consistent  with Grade I diastolic dysfunction (impaired relaxation).  2. Right ventricular systolic  function is normal. The right ventricular size is normal.  3. Left atrial size was mild to moderately dilated.  4. The mitral valve is normal in structure. Trivial mitral valve regurgitation. No evidence of mitral stenosis.  5. The aortic valve was not well visualized. There is mild calcification of the aortic valve. Aortic valve regurgitation is not visualized. Mild aortic valve sclerosis is present, with no evidence of aortic valve stenosis.  6. The inferior vena cava is normal in size with greater than 50% respiratory variability, suggesting right atrial pressure of 3 mmHg. FINDINGS  Left Ventricle: Left ventricular ejection fraction, by estimation, is 60 to 65%. The left ventricle has normal function. The left ventricle has no regional wall motion abnormalities. The left ventricular internal cavity size was normal in size. There is  mild left ventricular hypertrophy. Left ventricular diastolic parameters are consistent with Grade I diastolic dysfunction (impaired relaxation). Right Ventricle: The right ventricular size is normal. No increase in right ventricular wall thickness. Right ventricular systolic function is normal. Left Atrium: Left atrial size was mild to moderately dilated. Right Atrium: Right atrial size was normal in size. Pericardium: There is no evidence of pericardial effusion. Mitral Valve: The mitral valve is normal in structure. Trivial mitral valve regurgitation. No evidence of mitral valve stenosis. Tricuspid Valve: The tricuspid valve is normal in structure. Tricuspid valve regurgitation is mild . No evidence of tricuspid stenosis. Aortic Valve: The aortic valve was not well visualized. There is mild calcification of the aortic valve. Aortic valve regurgitation is not visualized. Mild aortic valve sclerosis is present, with no evidence of aortic valve stenosis. Aortic valve mean gradient measures 3.0  mmHg. Aortic valve peak gradient measures 4.9 mmHg. Aortic valve area, by VTI measures 3.14 cm. Pulmonic Valve: The pulmonic valve was not well visualized. Pulmonic valve regurgitation is not visualized. No evidence of pulmonic stenosis. Aorta: The aortic root is normal in size and structure. Venous: The inferior vena cava is normal in size with greater than 50% respiratory variability, suggesting right atrial pressure of 3 mmHg. IAS/Shunts: No atrial level shunt detected by color flow Doppler.  LEFT VENTRICLE PLAX 2D LVIDd:         3.30 cm  Diastology LVIDs:         2.20 cm  LV e' medial:    5.66 cm/s LV PW:         1.30 cm  LV E/e' medial:  8.7 LV IVS:        1.30 cm  LV e' lateral:   9.14 cm/s LVOT diam:     2.00 cm  LV E/e' lateral: 5.4 LV SV:         59 LV SV Index:   32 LVOT Area:     3.14 cm  RIGHT VENTRICLE RV S prime:     12.80 cm/s TAPSE (M-mode): 2.5 cm LEFT ATRIUM             Index LA diam:        2.50 cm 1.33 cm/m LA Vol (A2C):   50.8 ml 27.10 ml/m LA Vol (A4C):   49.0 ml 26.12 ml/m LA Biplane Vol: 49.6 ml 26.46 ml/m  AORTIC VALVE AV Area (Vmax):    2.69 cm AV Area (Vmean):   2.52 cm AV Area (VTI):     3.14 cm AV Vmax:           111.00 cm/s AV Vmean:          89.000 cm/s AV  VTI:            0.188 m AV Peak Grad:      4.9 mmHg AV Mean Grad:      3.0 mmHg LVOT Vmax:         95.00 cm/s LVOT Vmean:        71.300 cm/s LVOT VTI:          0.188 m LVOT/AV VTI ratio: 1.00  AORTA Ao Root diam: 3.90 cm MITRAL VALVE               TRICUSPID VALVE MV Area (PHT): 5.02 cm    TR Peak grad:   21.0 mmHg MV Decel Time: 151 msec    TR Vmax:        229.00 cm/s MV E velocity: 49.20 cm/s MV A velocity: 70.60 cm/s  SHUNTS MV E/A ratio:  0.70        Systemic VTI:  0.19 m                            Systemic Diam: 2.00 cm Glori Bickers MD Electronically signed by Glori Bickers MD Signature Date/Time: 01/07/2021/2:58:36 PM    Final      ASSESSMENT AND PLAN:   Thrombocytosis The patient has developed significant  thrombocytosis during his hospitalization It is reassuring that his platelet count was completely normal on admission making a myeloproliferative disorder less likely Recommend close monitoring of platelet count for now I believe he is chronically suppressed from alcohol intake and in the absence of alcohol, trauma, and iron deficiency, we are seeing a rebound effect with thrombocytosis He does not need treatment for this  Normocytic/macrocytic anemia Hemoglobin has remained overall stable in the 8-9 range He has borderline low iron levels as well as a borderline low B12 level Can consider administration of iron for iron deficiency if needed Receiving oral vitamin B12 Transfuse for hemoglobin less than 7  Subdural hematoma Noted to have increased subdural hematoma noted on last CT scan Neurosurgery following and will plan for repeat CT and possible drainage of the subdural hematoma Seen by neurology and neurosurgery and on Keppra/dexamethasone  History of CVA Currently on aspirin and statin Plavix on hold in the setting of subdural hematoma  Seizure Had generalized tonic-clonic seizure 5/12 EEG with no seizures but shows diffuse nonspecific versus postictal encephalopathy  Alcohol and tobacco abuse Recommend abstinence from alcohol and tobacco  Will see/follow as needed Please call if questions arise. I have reviewed my recommendations with primary service Thank you for this referral.  Mikey Bussing, DNP, AGPCNP-BC, AOCNP Heath Lark, MD

## 2021-01-17 NOTE — Progress Notes (Signed)
Spoke with Bretta Bang sister, Christie Copley at [phone]. She has been updated on her brothers condition.  [employer]., MSN, APRN, AGACNP-BC [facility]  01/17/2021 , 2:21 PM  Please see [email or web address] for pager details  If no response, please call [phone] After hours, please call Elink at [phone]

## 2021-01-17 NOTE — Progress Notes (Signed)
  Speech Language Pathology Treatment: Dysphagia  Patient Details Name: Seth [name] MRN: [id number] DOB: [birth year] Today's Date: 01/17/2021 Time: 4656-8127 SLP Time Calculation (min) (ACUTE ONLY): 10 min  Assessment / Plan / Recommendation Clinical Impression  RN and NT report pt has been tolerating PO well. Pt with dentures in place, able to masticate solids, but needs assist for feeding. When given thin liquids with a straw he will drink rapidly and continuously with immediate cough over two trials. Needs assisted self feeding to slow rate. Tolerated one sip at a time well. Pt needs supervision and assist with meals. SLP will upgrade to mech soft and f/u again 1x a week for tolerance and discussion with staff about precautions as needed. Sign posted today.   HPI HPI: Seth [name] is a 65 y.o. male  presents with Acute mental status decline - likely on by alcohol abuse, poor oral intake causing hypoglycemia, hypomagnesemia, dehydration, AKI and hyperkalemia, with possible DTs. CT showing SDH. PMH significant for history of CVA, PVD s/p LLE angiography with DES placed to popliteal artery, left foot osteomyelitis s/p transmetatarsal hypertension 09/22/2020, COPD, hypertension, alcohol use, and tobacco use. Patient had been seen by ST services and was placed on and tolerating Dys 2 solids, thin liquids diet with most recent SLP intervention on 5/18. On 5/19, patient exhibiting change in cognitive status and repeat CT revealed SDH increased in size from 56mm to 59mm. Patient transferred to ICU, made NPO and Cortrak placed for nutrition. Repeat BSE ordered secondary to change in status to reasses swallow function.      SLP Plan  Continue with current plan of care       Recommendations  Diet recommendations: Dysphagia 3 (mechanical soft);Thin liquid Liquids provided via: Straw;Cup Medication Administration: Whole meds with puree Supervision: Full supervision/cueing for compensatory  strategies;Staff to assist with self feeding Compensations: Slow rate;Small sips/bites;Follow solids with liquid Postural Changes and/or Swallow Maneuvers: Seated upright 90 degrees                Oral Care Recommendations: Oral care BID;Staff/trained caregiver to provide oral care Follow up Recommendations: 24 hour supervision/assistance;Skilled Nursing facility SLP Visit Diagnosis: Dysphagia, unspecified (R13.10) Plan: Continue with current plan of care       GO               Herbie [place], MA [place] Pager [phone] Office [phone]  Lynann Beaver 01/17/2021, 3:48 PM

## 2021-01-17 NOTE — Progress Notes (Signed)
NAME:  Seth [name], MRN:  [id number], DOB:  05/24/1956, LOS: 17 ADMISSION DATE:  01/01/2021, CONSULTATION DATE:  01/06/2021 REFERRING MD:  Dr. Candiss Norse, CHIEF COMPLAINT:  Seizure followed by brief VT arrest    History of Present Illness:  Seth [name] is a 65 y.o. male with PMH significant for prior stroke, HTN. ETOH abuse, PVD s/p DES to popliteal artery, left foor osteomyelitis, GOU, COPD, tobacco use, and depression who presented with for evaluation of AMS. On visit from family patient was seen with AMS prompting EMS call. Per chart review patient did report he passed out the day before ED arrival which was followed by vomiting.   On ED evaluation patient was seen with hypertension, tachycardia, and mildly elevated temp of 99.3. Head CT revealed left sided SDH with 66mm midline shift. Patient was admitted per [facility] with NSG consult.   Afternoon of 5/12 patient was seen with acute neuro change and later seen with full tonic clonic seizure. HE was administered IV ativan and once seizure activity stopped patient was take emergently to radiology for stat repeat head CT. En route to CT patient was seen with decreased mentation yet again and subsequent development of VT with brief (20-30sec) cardiac arrest. VT spontaneously resolved and patient regained pulses without any chest compressions or medications.   On 5/19 PCCM re-engaged as pt with worsening mental status, worsening subdural hemorrhage and is being transferred to the ICU   Pertinent  Medical History  Prior stroke, HTN. ETOH abuse, PVD s/p DES to popliteal artery, left foor osteomyelitis, GOU, COPD, tobacco use, and depression  Significant Hospital Events: Including procedures, antibiotic start and stop dates in addition to other pertinent events   . 5/7 Admitted for AMS  . 5/12 seizure followed by brief (20-30sec) cardiac arrest. VT spontaneously resolved and patient regained pulses without any chest compressions or medications.  . 5/18  worsening mental status noted by RN-- hallucinations, tremor, increased encephalopathy and somnolence. Put on CIWA protocol. . 5/19 worsening mental status -- repeat CT H acquired revealing increased subdural hematoma, increased mass effect, effacement of basal cisterns on L. Got 2mg  ativan for tremor vs generalized tonic clonic sz. Transferring to the ICU. Neurology, NSGY and PCCM called   Interim History / Subjective:  Agitated overnight, removed DHT  No other events  Tmax 98.8 Bp 107/72 to 166/75m HR 76-93  1L UOP, +369ml 24 hours, -6.65ml admission  Denies pain, chest pain, and shortness of breath.  Objective   Blood pressure 138/83, pulse 85, temperature 98.1 F (36.7 C), temperature source Oral, resp. rate (!) 21, height 6\' 3"  (1.905 m), weight 61.3 kg, SpO2 100 %.        Intake/Output Summary (Last 24 hours) at 01/17/2021 1105 Last data filed at 01/17/2021 1000 Gross per 24 hour  Intake 1520.71 ml  Output 1035 ml  Net 485.71 ml   Filed Weights   01/15/21 0500 01/16/21 0500 01/17/21 0500  Weight: 60.9 kg 60.9 kg 61.3 kg    Examination: General:  Sitting in bed, no acute distress, eating breakfast HEENT: MM pink/moist, anicteric, trachea midline  Neuro: GCS 14, confused, RASS 0, PERRL 54mm, MAE CV: S1S2, NSR, no m/r/g appreciated PULM:  Air movement in all lobes, scant secretions, chest expansion symmetric GI: soft, bsx4 active, nontender  Extremities: warm/dry, no pretibial edema, capillary refill less than 3 seconds  Skin: no rashes or lesions noted  Labs/imaging that I have personally reviewed   CBC, BMP   Resolved Hospital  Problem list    VT arrest (brief arrest 5/12 x 20-30seconds) Assessment & Plan:   Acute Left SDH with mass effect and midline shift , increased in size, increased edema and shift 5/19 -5/19 incr size SDH to 74mm from 66mm, and incr midline shift 41mm from 30mm, incr mass effect  Seizure -None since 5/19.  Continuous EEG reassuring Acute  Encephalopathy-- worse 5/19, improving 5/23 -in setting of above and metabolic  -mental status reportedly started to decline 5/18 approx 1700-- no imaging at that time, thought possible EtOH w/d. Continued to decline 5/19, neuro consulted by primary and sent for CT H which revealed worsening intracranial process as above  P: -Appreciate NSGY assistance and input. Will speak with Dr. Saintclair Halsted tomorrow regarding timing of repeat CT and need for drainage of SDH -Continue Keppra for seizure ppx -Will discuss plan for dexamethasone with Dr. Saintclair Halsted tomorrow. Started 5/20. -Continue Sitter at bedside -Continue PRN ativan for seizures -Continue PRN haldol for agitation  Hx COPD Suspected aspiration PNA  WBC 15.3>16, on RA P: -Continue to monitor fever curve off ABX -Continue Albuterol and breo -Will consider CXR if resp status declines  Hyponatremia NA 643>329>518. Uptrending  P: -Goal normonatremia -Continue to monitor  Hypertension P: -Goal SBP less than 160 -Continue metoprolol scheduled -Continue PRN labetolol   Hx EtOH abuse -Patient reports I1/2 -1 pint of vodka per day -has been admitted since 5/7 so probably outside of CIWA window by 5/19 P: -Cessation counseling when appropriate   Hx CVA  P -On ASA, statin -Holding plavix in the setting of SDH  Anemia of Critical Illness P: -Transfuse PRBC if HBG less than 7 -Obtain AM CBC to trend H&H  Thrombocytosis Worsening 980>1123 P: -Consulting heme/onc for assistance -Defer VTE ppx to NSGY  Hx PAD s/p DES to popliteal artery Hx L transmetatarsal amputation, osteomyelitis -Home medications include DAPT therapy with statin and Plavix P:    Best practice (right click and "Reselect all SmartList Selections" daily)  Diet:  NPO SLP following Pain/Anxiety/Delirium protocol (if indicated): No VAP protocol (if indicated): Not indicated DVT prophylaxis: SCD GI prophylaxis: PPI Glucose control:  SSI No Central venous  access:  N/A Arterial line:  N/A Foley:  N/A Mobility:  bed rest  PT consulted: N/A Last date of multidisciplinary goals of care discussion: Pending  Code Status:  full code Disposition: SD  Total critical care time: N/A  [employer]., MSN, APRN, AGACNP-BC [facility]  01/17/2021 , 11:06 AM  Please see [email or web address] for pager details  If no response, please call [phone] After hours, please call Elink at [phone]

## 2021-01-17 NOTE — Progress Notes (Signed)
Nutrition Follow-up  DOCUMENTATION CODES:   Underweight,Severe malnutrition in context of chronic illness  INTERVENTION:   - Ensure Enlive po TID, each supplement provides 350 kcal and 20 grams of protein  - Magic Cup TID with meals, each supplement provides 290 kcal and 9 grams of protein  - Continue MVI with minerals daily  - Encourage adequate PO intake  NUTRITION DIAGNOSIS:   Severe Malnutrition related to chronic illness,poor appetite as evidenced by percent weight loss,moderate fat depletion,severe fat depletion,severe muscle depletion,per patient/family report.  Ongoing, being addressed via oral nutrition supplements  GOAL:   Patient will meet greater than or equal to 90% of their needs  Progressing  MONITOR:   Diet advancement,Labs,Weight trends,TF tolerance,Skin,I & O's  REASON FOR ASSESSMENT:   Malnutrition Screening Tool    ASSESSMENT:   Pt admitted with AMS, dehydration, hypoglycemia, and SIRS and later found to have SDH. PMH includes CVA, PAD s/p LLE angiography with DES placed to popliteal artery, L foot osteomyelitis s/p transmetatarsal amputation, HTN, COPD, EtOH and tobacco use.  5/13 - s/p BSE, diet advanced to dysphagia 2 diet with thin liquids 5/19 - transferred to ICU 5/20 - Cortrak placed (tip gastric) 5/22 - diet advanced to dysphagia 2 with thin liquids 5/23 - pt pulled Cortrak out  Discussed pt with RN and during ICU rounds. Spoke with pt and NT sitter at bedside. Per pt and NT, pt consumed 100% of breakfast meal. Pt reports a good appetite. He does not remember pulling out his Cortrak NG tube.  RD will order oral nutrition supplements to aid pt in meeting kcal and protein needs via PO route. Pt is willing to consume Ensure supplements but states that they sometimes "go right through me." Will monitor for acceptance and tolerance and adjust supplement regimen as appropriate.  Admit weight: 62.4 kg Current weight: 61.3 kg  Meal Completion:  100% x 1 meal since diet advanced on 5/22  Medications reviewed and include: IV decadron, ferrous sulfate, MVI with minerals, protonix, vitamin B-12, keppra  Labs reviewed: sodium 134, hemoglobin 8.1 CBG's: 106-146 x 24 hours  UOP: 1075 ml x 24 hours Rectal tube: 110 ml x 24 hours + 2 unmeasured occurrences I/O's: -6.5 L since admit  Diet Order:   Diet Order            DIET DYS 2 Room service appropriate? Yes; Fluid consistency: Thin  Diet effective now                 EDUCATION NEEDS:   No education needs have been identified at this time  Skin:  Skin Assessment: Skin Integrity Issues: Other: skin tear to L shoulder  Last BM:  01/16/21 type 7 via rectal tube  Height:   Ht Readings from Last 1 Encounters:  01/02/21 6\' 3"  (1.905 m)    Weight:   Wt Readings from Last 1 Encounters:  01/17/21 61.3 kg    BMI:  Body mass index is 16.89 kg/m.  Estimated Nutritional Needs:   Kcal:  2000-2200  Protein:  100-115 grams  Fluid:  >2L/d    Gustavus Bryant, MS, RD, LDN Inpatient Clinical Dietitian Please see AMiON for contact information.

## 2021-01-17 NOTE — Progress Notes (Signed)
[facility] Progress Note Patient Name: Seth [name] DOB: 29-Nov-1955 MRN: [id number]   Date of Service  01/17/2021  HPI/Events of Note  Agitation - Nursing request to renew safety sitter.  eICU Interventions  Will renew safety sitter X 12 hours.      Intervention Category Major Interventions: Delirium, psychosis, severe agitation - evaluation and management  Lysle Dingwall 01/17/2021, 9:57 PM

## 2021-01-17 NOTE — Progress Notes (Signed)
Patients bed alarm was going off, RN entered the room and found patient standing at edge of bed, with Cortrack dislodged/removed. Helped patient back into bed. Notified ELink and will continue to monitor patient closely.

## 2021-01-18 DIAGNOSIS — S065X9A Traumatic subdural hemorrhage with loss of consciousness of unspecified duration, initial encounter: Secondary | ICD-10-CM | POA: Diagnosis not present

## 2021-01-18 DIAGNOSIS — G9340 Encephalopathy, unspecified: Secondary | ICD-10-CM | POA: Diagnosis not present

## 2021-01-18 LAB — BASIC METABOLIC PANEL
Anion gap: 10 (ref 5–15)
BUN: 20 mg/dL (ref 8–23)
CO2: 22 mmol/L (ref 22–32)
Calcium: 9.4 mg/dL (ref 8.9–10.3)
Chloride: 102 mmol/L (ref 98–111)
Creatinine, Ser: 0.85 mg/dL (ref 0.61–1.24)
GFR, Estimated: >60 mL/min (ref >60)
Glucose, Bld: 84 mg/dL (ref 70–99)
Potassium: 4.9 mmol/L (ref 3.5–5.1)
Sodium: 134 mmol/L — ABNORMAL LOW (ref 135–145)

## 2021-01-18 LAB — GLUCOSE, CAPILLARY
Glucose-Capillary: 104 mg/dL — ABNORMAL HIGH (ref 70–99)
Glucose-Capillary: 108 mg/dL — ABNORMAL HIGH (ref 70–99)
Glucose-Capillary: 137 mg/dL — ABNORMAL HIGH (ref 70–99)
Glucose-Capillary: 142 mg/dL — ABNORMAL HIGH (ref 70–99)
Glucose-Capillary: 79 mg/dL (ref 70–99)
Glucose-Capillary: 99 mg/dL (ref 70–99)

## 2021-01-18 LAB — CBC
HCT: 26.2 % — ABNORMAL LOW (ref 39.0–52.0)
Hemoglobin: 8.5 g/dL — ABNORMAL LOW (ref 13.0–17.0)
MCH: 32 pg (ref 26.0–34.0)
MCHC: 32.4 g/dL (ref 30.0–36.0)
MCV: 98.5 fL (ref 80.0–100.0)
Platelets: 1073 10*3/uL (ref 150–400)
RBC: 2.66 MIL/uL — ABNORMAL LOW (ref 4.22–5.81)
RDW: 17.2 % — ABNORMAL HIGH (ref 11.5–15.5)
WBC: 18.6 10*3/uL — ABNORMAL HIGH (ref 4.0–10.5)
nRBC: 0.4 % — ABNORMAL HIGH (ref 0.0–0.2)

## 2021-01-18 NOTE — Progress Notes (Signed)
NAME:  Seth [name], MRN:  [id number], DOB:  06-Nov-1955, LOS: 71 ADMISSION DATE:  01/01/2021, CONSULTATION DATE:  01/06/2021 REFERRING MD:  Dr. Candiss Norse, CHIEF COMPLAINT:  Seizure followed by brief VT arrest    History of Present Illness:  Seth [name] is a 65 y.o. male with PMH significant for prior stroke, HTN. ETOH abuse, PVD s/p DES to popliteal artery, left foor osteomyelitis, GOU, COPD, tobacco use, and depression who presented with for evaluation of AMS. On visit from family patient was seen with AMS prompting EMS call. Per chart review patient did report he passed out the day before ED arrival which was followed by vomiting.   On ED evaluation patient was seen with hypertension, tachycardia, and mildly elevated temp of 99.3. Head CT revealed left sided SDH with 29mm midline shift. Patient was admitted per [facility] with NSG consult.   Afternoon of 5/12 patient was seen with acute neuro change and later seen with full tonic clonic seizure. HE was administered IV ativan and once seizure activity stopped patient was take emergently to radiology for stat repeat head CT. En route to CT patient was seen with decreased mentation yet again and subsequent development of VT with brief (20-30sec) cardiac arrest. VT spontaneously resolved and patient regained pulses without any chest compressions or medications.   On 5/19 PCCM re-engaged as pt with worsening mental status, worsening subdural hemorrhage and is being transferred to the ICU   Pertinent  Medical History  Prior stroke, HTN. ETOH abuse, PVD s/p DES to popliteal artery, left foor osteomyelitis, GOU, COPD, tobacco use, and depression  Significant Hospital Events: Including procedures, antibiotic start and stop dates in addition to other pertinent events   . 5/7 Admitted for AMS  . 5/12 seizure followed by brief (20-30sec) cardiac arrest. VT spontaneously resolved and patient regained pulses without any chest compressions or medications.  . 5/18  worsening mental status noted by RN-- hallucinations, tremor, increased encephalopathy and somnolence. Put on CIWA protocol. . 5/19 worsening mental status -- repeat CT H acquired revealing increased subdural hematoma, increased mass effect, effacement of basal cisterns on L. Got 2mg  ativan for tremor vs generalized tonic clonic sz. Transferring to the ICU. Neurology, [facility] and PCCM called   Interim History / Subjective:  Heme consulted for thrombocytosis. PO iron added.  No acute events overnight  Tmax 99.1  BP 114/68-197/110, 1 dose prn labetalol given  1.9 L UOP, -1.7 L past 24, -5.7L admit  Denies chest pain and SOB. Enjoys eating his breakfast.  Objective   Blood pressure 125/73, pulse 95, temperature 98.2 F (36.8 C), temperature source Axillary, resp. rate (!) 22, height 6\' 3"  (1.905 m), weight 62.4 kg, SpO2 100 %.        Intake/Output Summary (Last 24 hours) at 01/18/2021 0917 Last data filed at 01/18/2021 2426 Gross per 24 hour  Intake 244.47 ml  Output 1975 ml  Net -1730.53 ml   Filed Weights   01/16/21 0500 01/17/21 0500 01/18/21 0500  Weight: 60.9 kg 61.3 kg 62.4 kg    Examination: General:  Awake, sitting in bed, no acute distress HEENT: MM pink/moist, anicteric, trachea midline  Neuro: GCS 14, confused, RASS 0, PERRL 31mm CV: S1S2, NSR, no m/r/g appreciated PULM:  Clear in the upper lobes and the lower lobes, scant secretions, chest expansion symmetric GI: soft, bsx4 active, nontender Extremities: warm/dry, no pretibial edema, capillary refill less than 3 seconds  Skin: no rashes or lesions noted   Labs/imaging that I  have personally reviewed   CBC, BMP  Resolved Hospital Problem list    VT arrest (brief arrest 5/12 x 20-30seconds) Assessment & Plan:   Acute Left SDH with mass effect and midline shift , increased in size, increased edema and shift 5/19 -5/19 incr size SDH to 37mm from 32mm, and incr midline shift 1mm from 69mm, incr mass effect   Seizure -None since 5/19.  Acute Encephalopathy-- worse 5/19, improving 5/23 -in setting of above and metabolic  -mental status reportedly started to decline 5/18 approx 1700-- no imaging at that time, thought possible EtOH w/d. Continued to decline 5/19, neuro consulted by primary and sent for CT H which revealed worsening intracranial process as above  P: -appreciate NSGY assistance and input. Messaged Meyran NP regarding plan for Head CT, need for dexamethasone, and chemical VTE PPX. -Continue dexamethasone until clarified by NSGY -Continue Keppra for seizure ppx -Continue sitter at bedside -continue Haldol PRN for agitation -continue Ativan PRN for seizures  Hx COPD Suspected aspiration PNA  Remains on RA. WBC 15.3>16>18.6, Suspect secondary to dexamethasone. Tmax 99. P: -Continue to monitor fever/wbc curve -Continue Breo and Albuterol -Consider CXR if decline in respiratory status or respiratory distress  Hyponatremia NA 633>354>562. Uptrending  P: -Goal normonatremia -Continue to monitor   Hypertension 1 dose prn labetalol given overnight P: -SBP goal less than 160 -Continue scheduled metoprolol -Continue PRN labetolol   Hx EtOH abuse -Patient reports I1/2 -1 pint of vodka per day -has been admitted since 5/7 so probably outside of CIWA window by 5/19 P: -Cessation counseling when appropriate  Hx CVA  P -On ASA and statin -Holding plavix post SDH  Normocytic/macrocytic Anemia P: -Appreciate Heme assist. Continue Iron and B12 -Transfuse PRBC if HBG less than 7 -Obtain AM CBC to trend H&H Thrombocytosis [phone]. Heme feels this is secondary to decrease in ETOH, trauma, and iron deficiency   P: -Appreciate Heme assistance -monitor -Defer to NSGY for DVT PPX  Hx PAD s/p DES to popliteal artery Hx L transmetatarsal amputation, osteomyelitis -Home medications include DAPT therapy with statin and Plavix P: -Continue ASA and Statin -Holding plavis  post SDH.  Best practice (right click and "Reselect all SmartList Selections" daily)  Diet:  Oral SLP following Pain/Anxiety/Delirium protocol (if indicated): No VAP protocol (if indicated): Not indicated DVT prophylaxis: SCD GI prophylaxis: PPI Glucose control:  SSI No Central venous access:  N/A Arterial line:  N/A Foley:  N/A Mobility:  bed rest  PT consulted: N/A Last date of multidisciplinary goals of care discussion: Pending  Code Status:  full code Disposition: Progressive Care  Total critical care time: N/A  Redmond School., MSN, APRN, AGACNP-BC [facility]  01/18/2021 , 9:17 AM  Please see [email or web address] for pager details  If no response, please call [phone] After hours, please call Elink at [phone]

## 2021-01-18 NOTE — Progress Notes (Addendum)
Occupational Therapy Re-evaluationTreatment Patient Details Name: Seth [name] MRN: [id number] DOB: 18-Jan-1956 Today's Date: 01/18/2021    History of present illness Seth [name] is a 65 y.o. male who presents to ED with AMS, 1 fall, and vomiting. Patient admitted with SIRS, AKI, hypoglycemia and dehydration. CT  5/11 (+) acute L SDH.  5/12  he had acute change in mental status and seizure and right hemibody weakness with ? cardiac arrest.   EEG 5/12-513.  Right hemibody weakness resolved 5/13. 5/19 pt with worsening SDH and transfer to ICU.  PMHx significant for HTN, ETOH & tobacco use disorder, COVID-19 ~3 mos ago, Hx of CVA, PVD, L transmet amp 09/22/2020, HTN, COPD   OT comments  Patient seen with PT to optimize participation and safety after transitioning to ICU due to worsening SDH.  Patient supine in bed and eager to mobilize.  Pt disoriented to time, demonstrate poor recall, decreased safety, awareness and problem solving. Patient requires min to mod assist +2 for transfers and limited in room mobility, limited by orthostatic hypotension (lightheaded and BP drop with standing), requires min assist for UB ADls and min assist +2 for LB ADLs.  Pt continues to demonstrate decreased balance, impaired cognition, weakness, decreased coordination in R UE, and possible R sided inattention. Based on performance today, believe he will benefit from further OT services while admitted and after dc at CIR level to optimize independence and return to PLOF with ADls, mobility.  BP:  Supine 121/63, 108 bpm  Sitting 189/111, 120 bpm  Standing 155/73, 121 bpm  Sitting after 2nd stand as BP would not register in standing 114/71, 101 bpm  BP after 2 min sitting in chair 130/85, 102 bpm      Follow Up Recommendations  CIR    Equipment Recommendations  3 in 1 bedside commode    Recommendations for Other Services      Precautions / Restrictions Precautions Precautions: Fall Precaution Comments:  orthostatic, flexiseal Restrictions Weight Bearing Restrictions: No       Mobility Bed Mobility Overal bed mobility: Needs Assistance Bed Mobility: Supine to Sit;Sit to Supine     Supine to sit: Min guard;HOB elevated     General bed mobility comments: min guard to get to EOB and scoot forward    Transfers Overall transfer level: Needs assistance Equipment used: 2 person hand held assist Transfers: Sit to/from [facility] Sit to Stand: Min assist;+2 safety/equipment Stand pivot transfers: Mod assist;+2 physical assistance;+2 safety/equipment       General transfer comment: min A to boost to standing with HHA bilaterally with mild posterior lean and incr time to attain full postural stability. pivot to recliner with mod assist +2 for balance and support    Balance Overall balance assessment: Needs assistance;History of Falls Sitting-balance support: Feet supported;No upper extremity supported Sitting balance-[name] Scale: Fair Sitting balance - Comments: able to maintain midline sitting at EOB, close S for safety with activities, very mild posterior drift Postural control: Posterior lean;Right lateral lean Standing balance support: Bilateral upper extremity supported;During functional activity Standing balance-[name] Scale: Poor Standing balance comment: MinA for balance statically with HHA bilaterally, mod assist dynamically with HHA bilaterally. Pt with significant posterior lean.                           ADL either performed or assessed with clinical judgement   ADL Overall ADL's : Needs assistance/impaired     Grooming: Minimal assistance;Sitting  Lower Body Bathing: Minimal assistance;Sit to/from stand;+2 for safety/equipment Lower Body Bathing Details (indicate cue type and reason): able to reach BLEs with increased effort, min assist +2 standing Upper Body Dressing : Minimal assistance;Sitting   Lower Body Dressing: Minimal  assistance;+2 for safety/equipment;Sit to/from stand Lower Body Dressing Details (indicate cue type and reason): able to manage socks with increased time, min assist +2 to stand Toilet Transfer: Moderate assistance;+2 for safety/equipment;+2 for physical assistance;Stand-pivot Toilet Transfer Details (indicate cue type and reason): B hand held assist to recliner         Functional mobility during ADLs: Minimal assistance;Moderate assistance;+2 for physical assistance;+2 for safety/equipment General ADL Comments: pt limited by cognition, weakness, balance and activity tolerance; delayed orthrostatic drop     Vision   Vision Assessment?: No apparent visual deficits Additional Comments: continue assessment   Perception     Praxis      Cognition Arousal/Alertness: Awake/alert Behavior During Therapy: WFL for tasks assessed/performed Overall Cognitive Status: Impaired/Different from baseline Area of Impairment: Attention;Memory;Following commands;Safety/judgement;Problem solving;Awareness;Orientation                 Orientation Level: Disoriented to;Time Current Attention Level: Sustained Memory: Decreased short-term memory Following Commands: Follows one step commands consistently;Follows one step commands with increased time Safety/Judgement: Decreased awareness of deficits;Decreased awareness of safety Awareness: Intellectual Problem Solving: Slow processing;Requires verbal cues;Requires tactile cues;Decreased initiation General Comments: able to follow simple one step verbal commands consistently, pleasant; disoriented to time (month) and requires choices to select year; unable to recall correct month after several minutes        Exercises     Shoulder Instructions       General Comments pt requires cueing to utilize R UE functionally, decreased proprioception noted with reaching back to chair during transfers.. mild inattention?    Pertinent Vitals/ Pain        Pain Assessment: No/denies pain  Home Living                                          Prior Functioning/Environment              Frequency  Min 2X/week        Progress Toward Goals  OT Goals(current goals can now be found in the care plan section)  Progress towards OT goals: Progressing toward goals  Acute Rehab OT Goals Patient Stated Goal: No goals stated. Time For Goal Achievement: 02/01/21 Potential to Achieve Goals: Good  Plan Discharge plan remains appropriate;Frequency remains appropriate    Co-evaluation    PT/OT/SLP Co-Evaluation/Treatment: Yes Reason for Co-Treatment: Complexity of the patient's impairments (multi-system involvement);For patient/therapist safety;To address functional/ADL transfers PT goals addressed during session: Mobility/safety with mobility OT goals addressed during session: ADL's and self-care      AM-PAC OT "6 Clicks" Daily Activity     Outcome Measure   Help from another person eating meals?: A Little Help from another person taking care of personal grooming?: A Little Help from another person toileting, which includes using toliet, bedpan, or urinal?: A Lot Help from another person bathing (including washing, rinsing, drying)?: A Lot Help from another person to put on and taking off regular upper body clothing?: A Little Help from another person to put on and taking off regular lower body clothing?: A Lot 6 Click Score: 15    End of Session Equipment Utilized During Treatment:  Gait belt  OT Visit Diagnosis: Unsteadiness on feet (R26.81);Other abnormalities of gait and mobility (R26.89);Muscle weakness (generalized) (M62.81) Hemiplegia - Right/Left: Right Hemiplegia - dominant/non-dominant: Non-Dominant Hemiplegia - caused by: Other cerebrovascular disease   Activity Tolerance Patient tolerated treatment well   Patient Left in chair;with call bell/phone within reach;with chair alarm set;with nursing/sitter  in room   Nurse Communication Mobility status        Time: 5885-0277 OT Time Calculation (min): 29 min  Charges: OT General Charges $OT Visit: 1 Visit OT Evaluation $OT Re-eval: 1 Re-eval  Jolaine Artist, OT Acute Rehabilitation Services Pager [phone] Office [phone]    Delight Stare 01/18/2021, 12:25 PM

## 2021-01-18 NOTE — Progress Notes (Signed)
Attempted to call Bretta Bang sister, Navarro Nine at [phone]. Unable to reach her for update on her brothers condition.  Redmond School., MSN, APRN, AGACNP-BC  Pulmonary & Critical Care  01/18/2021 , 5:56 PM  Please see [email or web address] for pager details  If no response, please call [phone] After hours, please call Elink at [phone]

## 2021-01-18 NOTE — Progress Notes (Signed)
NEUROSURGERY PROGRESS NOTE  Doing well. Denies any headaches, NV or vision changes. No acute events overnight Sitting up in bed eating, alert and oriented  Temp:  [97.4 F (36.3 C)-99 F (37.2 C)] 98.2 F (36.8 C) (05/24 0805) Pulse Rate:  [76-104] 88 (05/24 0700) Resp:  [12-33] 25 (05/24 0700) BP: (114-197)/(68-114) 144/81 (05/24 0700) SpO2:  [95 %-100 %] 100 % (05/24 0700) Weight:  [62.4 kg] 62.4 kg (05/24 0500)   Seth Chiquito, NP 01/18/2021 8:05 AM

## 2021-01-18 NOTE — Progress Notes (Signed)
Physical Therapy Re-evaluation/Treatment Patient Details Name: Seth [name] MRN: [id number] DOB: 29-Mar-1956 Today's Date: 01/18/2021    History of Present Illness Seth [name] is a 65 y.o. male who presents to ED with AMS, 1 fall, and vomiting. Patient admitted with SIRS, AKI, hypoglycemia and dehydration. CT  5/11 (+) acute L SDH.  5/12  he had acute change in mental status and seizure and right hemibody weakness with ? cardiac arrest.   EEG 5/12-513.  Right hemibody weakness resolved 5/13. 5/19 pt with worsening SDH and transfer to ICU.  PMHx significant for HTN, ETOH & tobacco use disorder, COVID-19 ~3 mos ago, Hx of CVA, PVD, L transmet amp 09/22/2020, HTN, COPD    PT Comments    Pt admitted with above diagnosis. Pt was able to participate with PT/OT and was able to pivot to chair with mod assist of 2 persons for safety due to decr balance and orthostasis.  See pressures below. Pt has had multiple setbacks while in hospital. Feel that pt would be a good Rehab candidate given his medical complexity. 0/3 goals met as pt has had complications during stay. Goals revised today.   Pt currently with functional limitations due to the deficits listed below (see PT Problem List). Pt will benefit from skilled PT to increase their independence and safety with mobility to allow discharge to the venue listed below.   Orthostatic BPs  Supine 121/63, 108 bpm  Sitting 189/111, 120 bpm  Standing 155/73, 121 bpm  Sitting after 2nd stand as BP would not register in standing 114/71, 101 bpm  BP after 2 min sitting in chair 130/85, 102 bpm    Follow Up Recommendations  CIR     Equipment Recommendations  Rolling [place] with 5" wheels;3in1 (PT);Wheelchair cushion (measurements PT);Wheelchair (measurements PT)    Recommendations for Other Services OT consult;Rehab consult     Precautions / Restrictions Precautions Precautions: Fall Precaution Comments: orthostatic Restrictions Weight Bearing  Restrictions: No    Mobility  Bed Mobility Overal bed mobility: Needs Assistance Bed Mobility: Supine to Sit;Sit to Supine     Supine to sit: Min guard;HOB elevated     General bed mobility comments: min guard to get to EOB and scoot forward    Transfers Overall transfer level: Needs assistance Equipment used: Rolling [place] (2 wheeled) Transfers: Sit to/from Stand Sit to Stand: Min assist;+2 safety/equipment         General transfer comment: min A to boost to standing with HHA bilaterally with mild posterior lean and incr time to attain full postural stability.  Ambulation/Gait Ambulation/Gait assistance: Mod assist;+2 physical assistance Gait Distance (Feet): 5 Feet Assistive device: 2 person hand held assist Gait Pattern/deviations: Trunk flexed;Shuffle;Decreased stride length;Drifts right/left;Leaning posteriorly;Step-through pattern Gait velocity: decreased Gait velocity interpretation: <1.31 ft/sec, indicative of household ambulator General Gait Details: Pt BPs fluctuating with position changes but trended up with initial stand and then toward end of treatment trend down.  Pt c/o dizziness at times but was able to take a few steps to chair with Bil HHA.   Stairs             Wheelchair Mobility    Modified Rankin (Stroke Patients Only) Modified Rankin (Stroke Patients Only) Pre-Morbid Rankin Score: No significant disability Modified Rankin: Moderately severe disability     Balance Overall balance assessment: Needs assistance;History of Falls Sitting-balance support: Feet supported;No upper extremity supported Sitting balance-[name] Scale: Fair Sitting balance - Comments: able to maintain midline sitting at EOB, close S  for safety with activities, very mild posterior drift Postural control: Posterior lean;Right lateral lean Standing balance support: Bilateral upper extremity supported;During functional activity Standing balance-[name] Scale: Poor Standing  balance comment: MinA for balance statically with HHA bilaterally, mod assist dynamically with HHA bilaterally. Pt with significant posterior lean.                            Cognition Arousal/Alertness: Awake/alert Behavior During Therapy: WFL for tasks assessed/performed Overall Cognitive Status: Impaired/Different from baseline Area of Impairment: Attention;Memory;Following commands;Safety/judgement;Problem solving;Awareness                 Orientation Level: Disoriented to;Time Current Attention Level: Sustained Memory: Decreased short-term memory Following Commands: Follows one step commands consistently;Follows one step commands with increased time Safety/Judgement: Decreased awareness of deficits;Decreased awareness of safety Awareness: Intellectual Problem Solving: Slow processing;Requires verbal cues;Requires tactile cues;Decreased initiation General Comments: able to follow simple one step verbal commands consistently, pleasant      Exercises      General Comments General comments (skin integrity, edema, etc.): Strength grossly 4/5 hip, knee and ankle. Pt reports "pins and needles" in bil LEs and feet      Pertinent Vitals/Pain Pain Assessment: No/denies pain    Home Living                      Prior Function            PT Goals (current goals can now be found in the care plan section) Acute Rehab PT Goals PT Goal Formulation: With patient Time For Goal Achievement: 02/01/21 Potential to Achieve Goals: Good Progress towards PT goals: Goals downgraded-see care plan    Frequency    Min 3X/week      PT Plan Discharge plan needs to be updated    Co-evaluation PT/OT/SLP Co-Evaluation/Treatment: Yes Reason for Co-Treatment: Complexity of the patient's impairments (multi-system involvement);For patient/therapist safety PT goals addressed during session: Mobility/safety with mobility        AM-PAC PT "6 Clicks" Mobility    Outcome Measure  Help needed turning from your back to your side while in a flat bed without using bedrails?: None Help needed moving from lying on your back to sitting on the side of a flat bed without using bedrails?: A Little Help needed moving to and from a bed to a chair (including a wheelchair)?: A Lot Help needed standing up from a chair using your arms (e.g., wheelchair or bedside chair)?: A Lot Help needed to walk in hospital room?: A Lot Help needed climbing 3-5 steps with a railing? : Total 6 Click Score: 14    End of Session Equipment Utilized During Treatment: Gait belt Activity Tolerance: Treatment limited secondary to medical complications (Comment);Patient limited by fatigue (blood pressures) Patient left: with call bell/phone within reach;in chair;with chair alarm set;with nursing/sitter in room Nurse Communication: Mobility status;Other (comment) (blood pressures during session) PT Visit Diagnosis: Unsteadiness on feet (R26.81);Repeated falls (R29.6);Muscle weakness (generalized) (M62.81);Dizziness and giddiness (R42)     Time: 1010-1040 PT Time Calculation (min) (ACUTE ONLY): 30 min  Charges:    Ozella Almond M,PT Acute Rehab Services [phone] (pager)   Alvira Philips 01/18/2021, 11:39 AM

## 2021-01-18 NOTE — Progress Notes (Signed)
Rehab Admissions Coordinator Note:  Patient was screened by Cleatrice Burke for appropriateness for an Inpatient Acute Rehab Consult per therapy recommendations. Previously consulted on 01/07/2021. On 5/14 Gayland Curry discussed with sister, [name] . Family is unable to provide 24/7 supervision that is projected to be needed after a rehab admit therefore SNF was recommended. We will not pursue CIR admit at this time.  [employer] medicare will not approve both CIR and SNF.  Cleatrice Burke RN MSN 01/18/2021, 1:34 PM  I can be reached at [phone].

## 2021-01-19 ENCOUNTER — Inpatient Hospital Stay (HOSPITAL_COMMUNITY): Payer: Medicare Other

## 2021-01-19 DIAGNOSIS — R6510 Systemic inflammatory response syndrome (SIRS) of non-infectious origin without acute organ dysfunction: Secondary | ICD-10-CM | POA: Diagnosis not present

## 2021-01-19 LAB — CBC
HCT: 27.3 % — ABNORMAL LOW (ref 39.0–52.0)
Hemoglobin: 8.6 g/dL — ABNORMAL LOW (ref 13.0–17.0)
MCH: 31.4 pg (ref 26.0–34.0)
MCHC: 31.5 g/dL (ref 30.0–36.0)
MCV: 99.6 fL (ref 80.0–100.0)
Platelets: 1131 10*3/uL (ref 150–400)
RBC: 2.74 MIL/uL — ABNORMAL LOW (ref 4.22–5.81)
RDW: 17.4 % — ABNORMAL HIGH (ref 11.5–15.5)
WBC: 20 10*3/uL — ABNORMAL HIGH (ref 4.0–10.5)
nRBC: 0.3 % — ABNORMAL HIGH (ref 0.0–0.2)

## 2021-01-19 LAB — BASIC METABOLIC PANEL
Anion gap: 7 (ref 5–15)
BUN: 19 mg/dL (ref 8–23)
CO2: 25 mmol/L (ref 22–32)
Calcium: 9.7 mg/dL (ref 8.9–10.3)
Chloride: 103 mmol/L (ref 98–111)
Creatinine, Ser: 0.86 mg/dL (ref 0.61–1.24)
GFR, Estimated: >60 mL/min (ref >60)
Glucose, Bld: 94 mg/dL (ref 70–99)
Potassium: 4.5 mmol/L (ref 3.5–5.1)
Sodium: 135 mmol/L (ref 135–145)

## 2021-01-19 LAB — GLUCOSE, CAPILLARY
Glucose-Capillary: 89 mg/dL (ref 70–99)
Glucose-Capillary: 83 mg/dL (ref 70–99)
Glucose-Capillary: 101 mg/dL — ABNORMAL HIGH (ref 70–99)
Glucose-Capillary: 146 mg/dL — ABNORMAL HIGH (ref 70–99)
Glucose-Capillary: 174 mg/dL — ABNORMAL HIGH (ref 70–99)

## 2021-01-19 IMAGING — CT CT HEAD W/O CM
5 of 8 series · 16 of 47 positions shown, 17 images · non-contrast
Comparison: Prior head CT examinations [DATE] and earlier.

CLINICAL DATA: Subdural hemorrhage.

EXAM:
CT HEAD WITHOUT CONTRAST
TECHNIQUE: Contiguous axial images were obtained from the base of the skull
through the vertex without intravenous contrast.

[Series 3: head without · axial · non-contrast · 0.45mm/px · z∈[-111,-61]mm · 2 of 30 slices shown, 3 images (1 of 2)]
[im 10/30  brain]
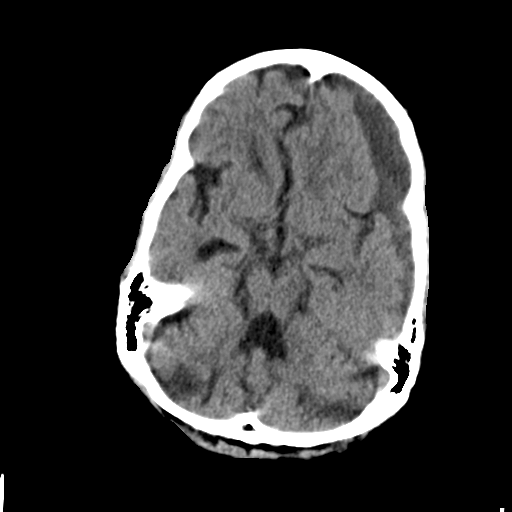
[im 10/30  bone]
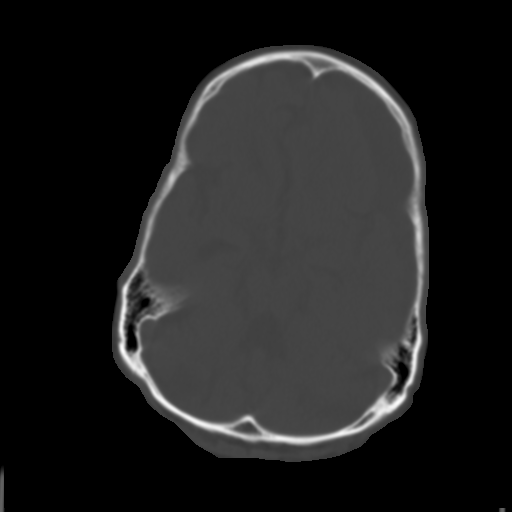
[im 20/30  brain]
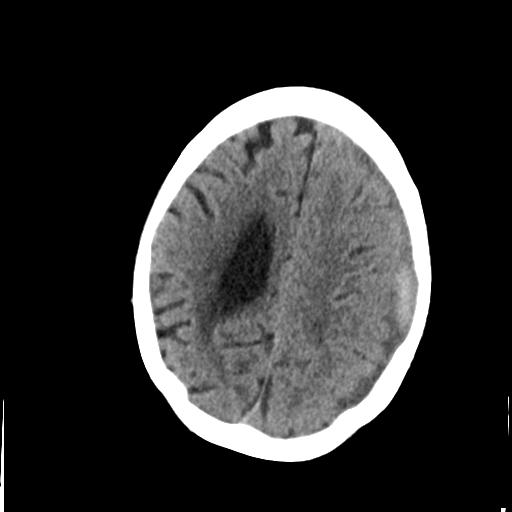

[Series 4: head bone · axial · 0.45mm/px · z∈[-142,-36]mm · 7 of 76 slices shown]
[im 8/76  bone]
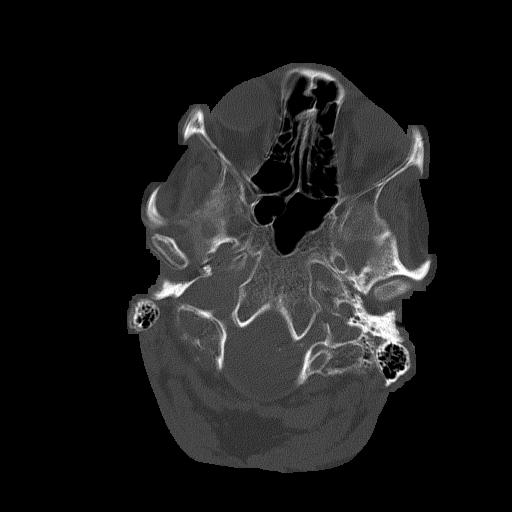
[im 16/76  bone]
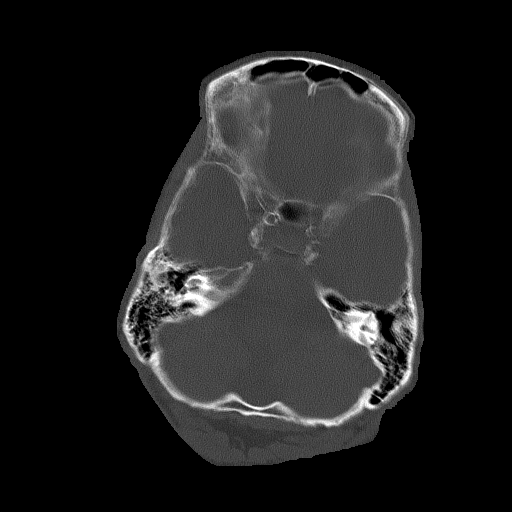
[im 23/76  bone]
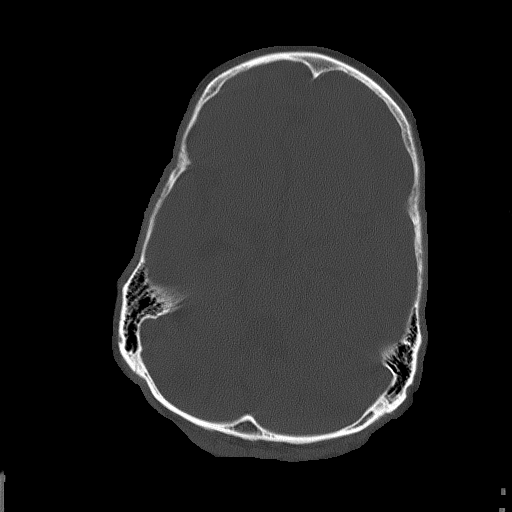
[im 31/76  bone]
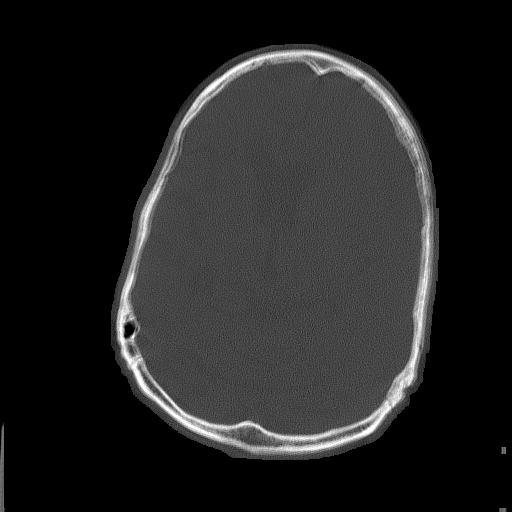
[im 46/76  bone]
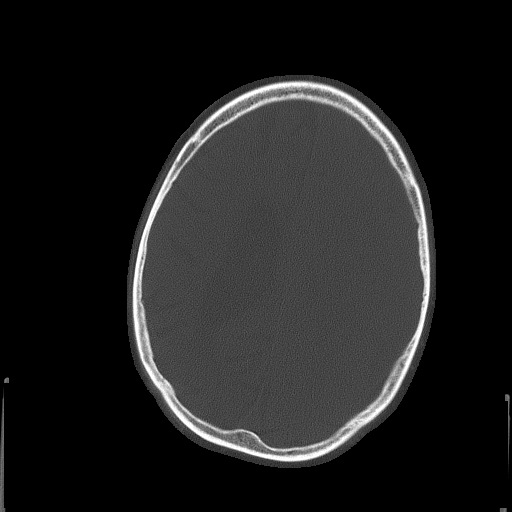
[im 53/76  bone]
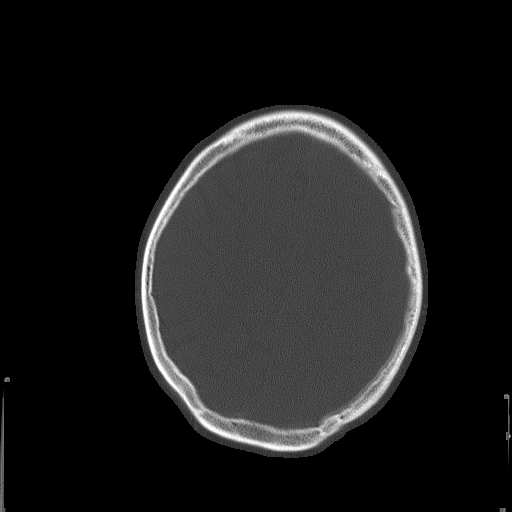
[im 61/76  bone]
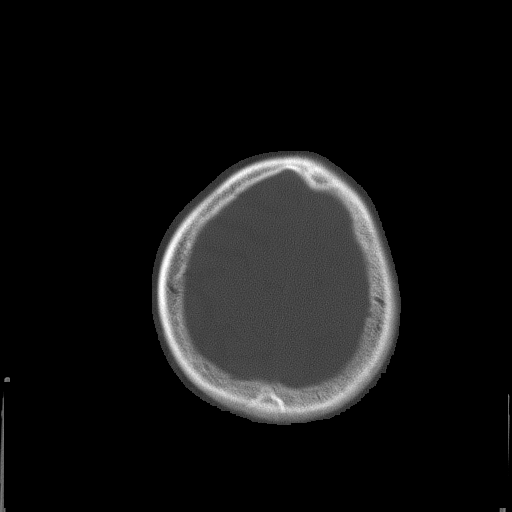

[Series 5: head without · axial · non-contrast · 0.45mm/px · z∈[-106,-56]mm · 2 of 31 slices shown (2 of 2)]
[im 11/31  brain]
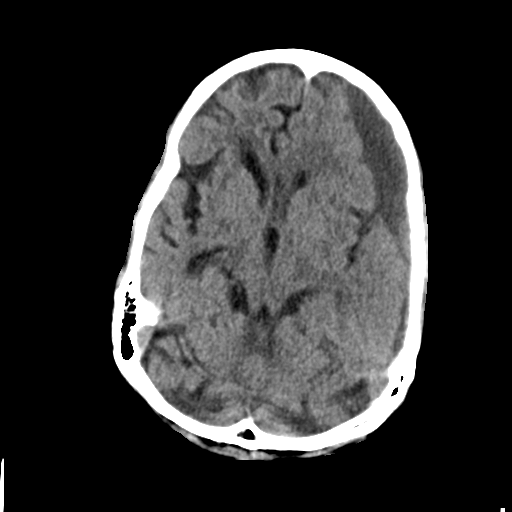
[im 21/31  brain]
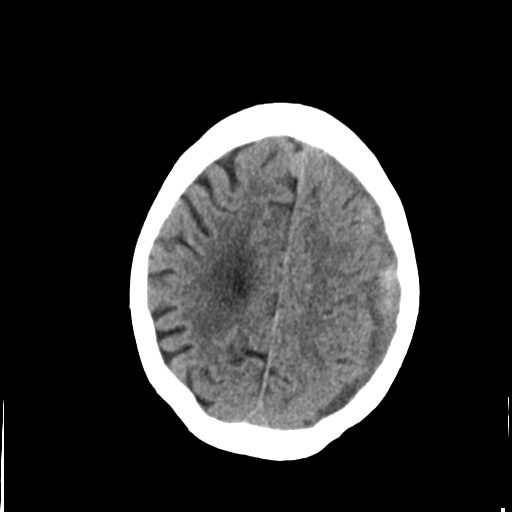

[Series 7: head without cor · coronal · non-contrast · 0.30mm/px · 3 of 72 slices shown]
[im 20/72  brain]
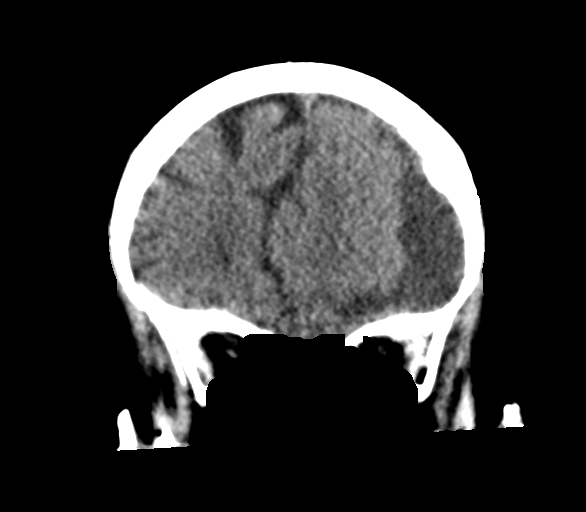
[im 36/72  brain]
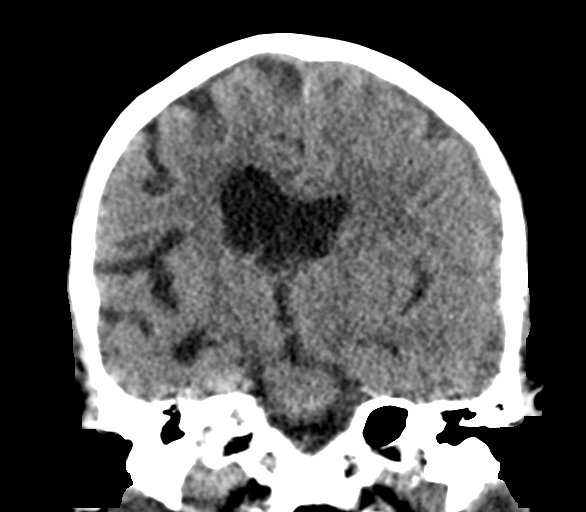
[im 52/72  brain]
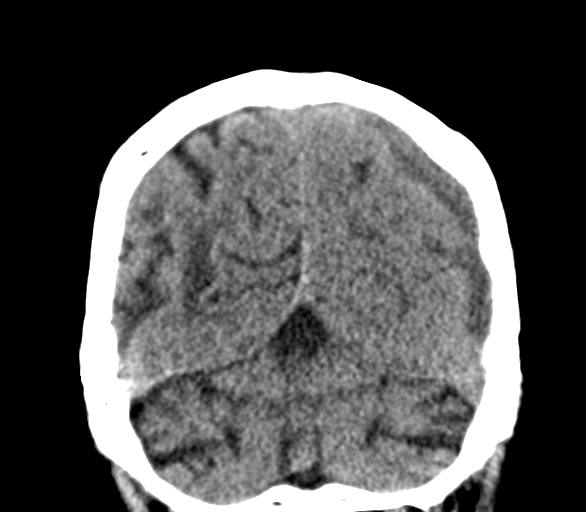

[Series 10: head without sag · sagittal · non-contrast · 0.30mm/px · 2 of 58 slices shown]
[im 20/58  brain]
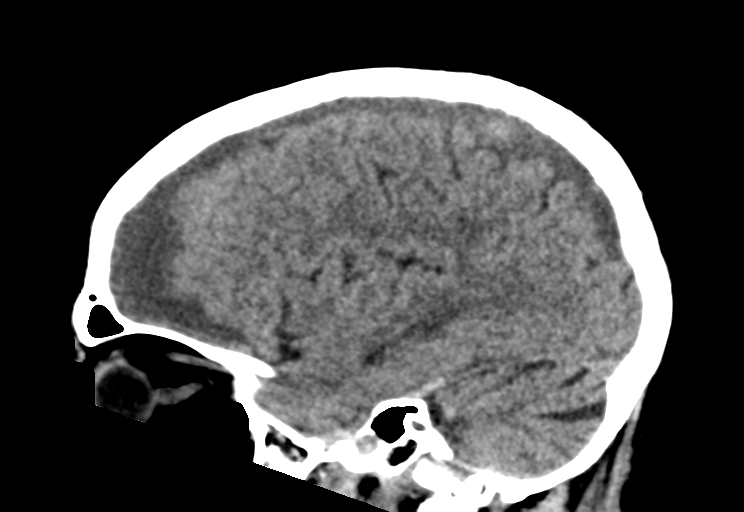
[im 39/58  brain]
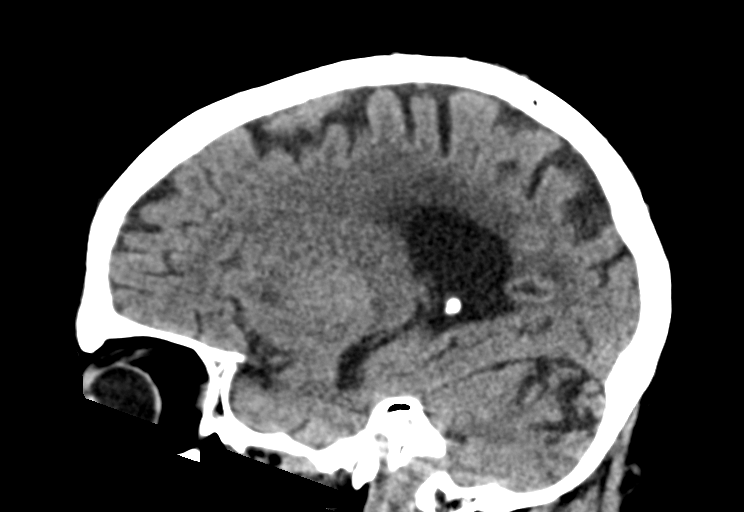

[16 of 47 positions shown; findings below may reference images not displayed]

FINDINGS: Brain:

Stable cerebral and cerebellar atrophy.

A mixed density subdural hematoma overlying the left cerebral
hemisphere has not significantly changed in size, again measuring up
to 2.1 cm in greatest thickness (remeasured on prior). Hyperdense
components have become less conspicuous as compared to the prior
examination of [DATE]. Unchanged mass effect upon the underlying
left cerebral hemisphere with partial effacement of the left lateral
ventricle. Unchanged 8 mm rightward midline shift measured at the
level of the septum pellucidum. As before, there is slight
asymmetric effacement of the basal cisterns on the left.

Previously demonstrated trace parafalcine subdural hemorrhage is no
longer well appreciated.

Stable chronic small vessel ischemic disease.

Redemonstrated chronic infarcts within the bilateral cerebellar
hemispheres.

Unchanged 9 mm dural-based mass along the right aspect of the falx
(overlying the medial right frontal lobe), likely reflecting an
incidental meningioma.

Vascular: No hyperdense vessel.  Atherosclerotic calcifications.

Skull: Normal. Negative for fracture or focal lesion.

Sinuses/Orbits: Visualized orbits show no acute finding. Trace
scattered bilateral ethmoid sinus mucosal thickening. Partially
imaged right maxillary sinus mucous retention cyst.
IMPRESSION: Unchanged size of a mixed density subdural hematoma overlying the
left cerebral hemisphere as compared to the head CT of [DATE].
Hyperdense components within the collection have decreased.

Unchanged mass effect upon the underlying left cerebral hemisphere
with partial effacement of the left lateral ventricle. Unchanged 8
mm rightward midline shift. As before, there is mild asymmetric
effacement of the basal cisterns on the left.

Previously demonstrated trace parafalcine subdural hemorrhage is no
longer appreciated.

Stable parenchymal atrophy and chronic small vessel ischemic
disease.

Redemonstrated chronic infarcts within the bilateral cerebellar
hemispheres.

Redemonstrated 9 mm right parafalcine meningioma.

## 2021-01-19 NOTE — Progress Notes (Signed)
PROGRESS NOTE    Seth [name]  TJQ:[id number] DOB: 1956/07/22 DOA: 01/01/2021 PCP: Lucianne Lei, MD    Brief Narrative:  Seth [name] is a 65 year old male with past medical history significant for CVA, essential hypertension, EtOH abuse, PVD s/p DES to popliteal artery, left foot osteomyelitis, COPD, tobacco use disorder, depression who presented to [name] ED on 01/01/2021 for evaluation of altered mental status.  Patient was found to have left-sided subdural hematoma with 6 mm midline shift.  Neurosurgery was consulted and initially admitted under [facility] service.  On the afternoon of 5/12, patient with acute neuro change with full tonic-clonic seizure.  Patient was given IV Ativan and repeat stat CT ordered with unfortunate development of ventricular tachycardia with brief 20-30-second cardiac arrest with spontaneous resolution of VT and patient regained pulse without chest compressions or medications.  On 5/19 PCCM was reengaged as patient with worsening mental status, worsening subdural hemorrhage and was subsequently transferred to the ICU.  Patient now transferred back to [facility] from PCCM on 01/19/2021.   Assessment & Plan:   Principal Problem:   SIRS (systemic inflammatory response syndrome) (HCC) Active Problems:   COPD (chronic obstructive pulmonary disease) (HCC)   Hyperkalemia   AKI (acute kidney injury) ([place])   Hypoglycemia   Alcohol use   History of CVA (cerebrovascular accident)   VT (ventricular tachycardia) (HCC)   Benign essential HTN   Benign prostatic hyperplasia   Depression   ETOH abuse   Hyponatremia   Protein-calorie malnutrition, severe   Acute encephalopathy   SDH (subdural hematoma) (HCC)   Acute metabolic encephalopathy: Improved Etiology likely multifactorial with subdural hematoma, seizure, and possible EtOH withdrawal symptoms.  Continue treatment as below.  Acute left subdural hematoma with mass-effect/midline shift Patient initially presenting to  [name] ED with altered mental status and was found to have a acute left subdural hematoma.  During this hospitalization, patient with further decompensation with associated tonic-clonic seizure and worsening of mental status.  Repeat CT head on 5/19 shows increased SDH to 14 mm from 10 mm and increased midline shift 8 mm from 4 mm. --Neurosurgery following, appreciate assistance; currently nonsurgical management --Repeat CT head without contrast today. --Dexamethasone 4 mg IV every 24 hours --Keppra 500 mg p.o. twice daily  Seizure Patient was noted developed seizure activity on 5/12.  EEG 5/12 with moderate/severe diffuse encephalopathy, nonspecific.  No seizures or epileptiform discharges noted.  Repeat EEG on 5/20 with no significant changes and without seizure/epileptiform discharges. --Continue dexamethasone, per neurosurgery --Keppra 500 mg twice daily --Seizure precautions  Aspiration pneumonia, suspected During ICU hospitalization, patient was suspected to have aspiration motion of following seizure activity and altered mental status from subdural hematoma.  Patient completed course of antibiotics with vancomycin, Zosyn, ampicillin, Augmentin.  Dysphagia Nursing reports coughing while eating.  Seen by SLP on 5/25 with recommendations of regular diet with nectar thick liquids. --Follow-up MBS pending  COPD --Breo-Ellipta 200-66mcg 1 puff daily --albuterol neb prn  Hyponatremia Etiology likely secondary to continued EtOH abuse. --Na 131>>135 --BMP daily --Encourage increase oral intake, EtOH cessation  Essential hypertension --Metoprolol tartrate 100 mg p.o. twice daily --Labetalol 10 mg IV every 2 hours as needed SBP >160 or DBP >100  Leukocytosis Etiology likely secondary to white blood cell de-maginalization. Patient is afebrile. --Further taper of dexamethasone per neurosurgery --CBC daily  Thrombocytosis Evaluated by hematology,Dr. Alvy Bimler on 5/23.  Patient  developed significant thrombocytosis during this hospitalization; suspect etiology from chronic suppression with alcohol use disorder  with rebound effect.  No further work-up or treatment required.  EtOH abuse Patient reports 0.5-1 pint vodka daily.  110 on admission.  Discussed need for complete cessation. 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 Patient with notable muscle wasting/fat depletion as evidenced by weight loss. Nutrition Status: Nutrition Problem: Severe Malnutrition Etiology: chronic illness,poor appetite Signs/Symptoms: percent weight loss,moderate fat depletion,severe fat depletion,severe muscle depletion,per patient/family report Percent weight loss: 10.6 % Interventions: Tube feeding,Prostat,MVI --Dietitian following, appreciate assistance --Continue supplements, encourage increase oral intake    DVT prophylaxis: SCDs   Code Status: Full Code Family Communication: No family present at bedside this morning  Disposition Plan:  Level of care: Telemetry Medical Status is: Inpatient  Remains inpatient appropriate because:Ongoing diagnostic testing needed not appropriate for outpatient work up, Unsafe d/c plan, IV treatments appropriate due to intensity of illness or inability to take PO and Inpatient level of care appropriate due to severity of illness   Dispo: The patient is from: Home              Anticipated d/c is to: SNF               Patient currently is not medically stable to d/c.   Difficult to place patient No   Consultants:   PCCM  Neurosurgery  Neurology  Procedures:   TTE  EEG  Antimicrobials:   Vancomycin 5/7 - 5/8, 5/18 - 5/21  Zosyn 5/7 - 5/8  Ampicillin 5/11 - 5/13  Augmentin 5/13 - 5/18    Significant Hospital Events: Including procedures, antibiotic start and stop dates in addition to other pertinent events    5/7 Admitted for AMS   5/12 seizure followed by brief (20-30sec) cardiac arrest. VT spontaneously resolved and patient regained pulses without any chest compressions or medications.   5/18 worsening mental status noted by RN-- hallucinations, tremor, increased encephalopathy and somnolence. Put on CIWA protocol.  5/19 worsening mental status -- repeat CT H acquired revealing increased subdural hematoma, increased mass effect, effacement of basal cisterns on L. Got 2mg  ativan for tremor vs generalized tonic clonic sz. Transferring to the ICU. Neurology, [place] and PCCM called   5/25 Transferred to [facility]    Subjective: Patient seen and examined at bedside, resting comfortably.  Getting cleaned up this morning with NT.  And he reports coughing episodes while he eats.  Patient with no family present at bedside.  No acute concerns or complaints this morning.  Nursing requesting transfer orders that apparently have been previously "canceled".  Patient denies headache, no dizziness, no fever/chills/night sweats, no nausea/vomiting/diarrhea, no chest pain, no palpitations, no shortness of breath, no abdominal pain.  No other acute concerns overnight or this morning per nursing staff.  Objective: Vitals:   01/19/21 0738 01/19/21 0800 01/19/21 0900 01/19/21 1136  BP:  (!) 147/76 138/76   Pulse:  87 90   Resp:  15 18   Temp: 98.3 F (36.8 C)   98.2 F (36.8 C)  TempSrc: Oral   Oral  SpO2:  100% 100%   Weight:      Height:        Intake/Output Summary (Last 24 hours) at  01/19/2021 1311 Last data filed at 01/19/2021 0612 Gross per 24 hour  Intake 396.81 ml  Output 825 ml  Net -428.19 ml   Filed Weights   01/17/21 0500 01/18/21 0500 01/19/21 0500  Weight: 61.3 kg 62.4 kg 61.7 kg    Examination:  General exam: Appears calm and comfortable  Respiratory system: Clear to auscultation. Respiratory effort normal. Cardiovascular system: S1 & S2 heard, RRR. No JVD, murmurs, rubs, gallops or clicks. No pedal edema. Gastrointestinal system: Abdomen is nondistended, soft and nontender. No organomegaly or masses felt. Normal bowel sounds heard. Central nervous system: Alert and oriented. No focal neurological deficits. Extremities: Symmetric 5 x 5 power. Skin: No rashes, lesions or ulcers Psychiatry: Judgement and insight appear normal. Mood & affect appropriate.     Data Reviewed: I have personally reviewed following labs and imaging studies  CBC: Recent Labs  Lab 01/13/21 0143 01/14/21 0914 01/15/21 0659 01/16/21 0040 01/17/21 0239 01/18/21 0226 01/19/21 0241  WBC 14.5* 11.1* 16.0* 15.3* 16.3* 18.6* 20.0*  NEUTROABS 10.8* 8.1*  --   --   --   --   --   HGB 7.6* 8.2* 8.6* 8.1* 8.1* 8.5* 8.6*  HCT 23.4* 25.4* 27.1* 25.2* 25.2* 26.2* 27.3*  MCV 99.2 100.0 98.9 99.2 99.2 98.5 99.6  PLT 839* 858* 932* 980* 1,123* 1,073* 1,308*   Basic Metabolic Panel: Recent Labs  Lab 01/12/21 1649 01/13/21 0143 01/14/21 0914 01/14/21 1656 01/15/21 0659 01/16/21 0040 01/17/21 0239 01/18/21 0226 01/19/21 0241  NA 131* 130* 129*  --  131* 134* 134* 134* 135  K 4.4 4.5 4.3  --  4.6 4.2 4.4 4.9 4.5  CL 98 99 98  --  99 101 103 102 103  CO2 22 23 22   --  22 23 22 22 25   GLUCOSE 82 92 87  --  119* 127* 103* 84 94  BUN 9 8 8   --  13 12 13 20 19   CREATININE 0.81 0.94 0.90  --  0.96 0.83 0.80 0.85 0.86  CALCIUM 9.3 9.1 9.2  --  9.3 9.2 9.0 9.4 9.7  MG 1.8 1.7 2.0 1.9 1.9 2.0  --   --   --   PHOS 4.0  --  4.8* 4.4 2.3*  --   --   --   --    GFR: Estimated  Creatinine Clearance: 76.7 mL/min (by C-G formula based on SCr of 0.86 mg/dL). Liver Function Tests: Recent Labs  Lab 01/12/21 1649 01/13/21 0143 01/14/21 0914  AST 20 15 17   ALT 12 12 10   ALKPHOS 53 49 54  BILITOT 0.5 0.6 0.8  PROT 6.3* 5.7* 6.3*  ALBUMIN 2.8* 2.5* 2.7*   No results for input(s): LIPASE, AMYLASE in the last 168 hours. No results for input(s): AMMONIA in the last 168 hours. Coagulation Profile: Recent Labs  Lab 01/17/21 0239  INR 1.0   Cardiac Enzymes: No results for input(s): CKTOTAL, CKMB, CKMBINDEX, TROPONINI in the last 168 hours. BNP (last 3 results) No results for input(s): PROBNP in the last 8760 hours. HbA1C: No results for input(s): HGBA1C in the last 72 hours. CBG: Recent Labs  Lab 01/18/21 1948 01/18/21 2330 01/19/21 0343 01/19/21 0736 01/19/21 1134  GLUCAP 142* 104* 89 83 101*   Lipid Profile: No results for input(s): CHOL, HDL, LDLCALC, TRIG, CHOLHDL, LDLDIRECT in the last 72  hours. Thyroid Function Tests: No results for input(s): TSH, T4TOTAL, FREET4, T3FREE, THYROIDAB in the last 72 hours. Anemia Panel: No results for input(s): VITAMINB12, FOLATE, FERRITIN, TIBC, IRON, RETICCTPCT in the last 72 hours. Sepsis Labs: Recent Labs  Lab 01/13/21 0208 01/14/21 0914  PROCALCITON <0.10 <0.10    Recent Results (from the past 240 hour(s))  SARS CORONAVIRUS 2 (TAT 6-24 HRS) Nasopharyngeal Nasopharyngeal Swab     Status: None   Collection Time: 01/11/21 10:05 AM   Specimen: Nasopharyngeal Swab  Result Value Ref Range Status   SARS Coronavirus 2 NEGATIVE NEGATIVE Final    Comment: (NOTE) SARS-CoV-2 target nucleic acids are NOT DETECTED.  The SARS-CoV-2 RNA is generally detectable in upper and lower respiratory specimens during the acute phase of infection. Negative results do not preclude SARS-CoV-2 infection, do not rule out co-infections with other pathogens, and should not be used as the sole basis for treatment or other patient  management decisions. Negative results must be combined with clinical observations, patient history, and epidemiological information. The expected result is Negative.  Fact Sheet for Patients: SugarRoll.be  Fact Sheet for Healthcare Providers: https://www.woods-mathews.com/  This test is not yet approved or cleared by the [place] FDA and  has been authorized for detection and/or diagnosis of SARS-CoV-2 by FDA under an Emergency Use Authorization (EUA). This EUA will remain  in effect (meaning this test can be used) for the duration of the COVID-19 declaration under Se ction 564(b)(1) of the Act, 21 U.S.C. section 360bbb-3(b)(1), unless the authorization is terminated or revoked sooner.  Performed at [facility], [place] [address] [place], [place] [address]   Culture, blood (routine x 2)     Status: None   Collection Time: 01/12/21  6:01 PM   Specimen: BLOOD  Result Value Ref Range Status   Specimen Description BLOOD SITE NOT SPECIFIED  Final   Special Requests   Final    BOTTLES DRAWN AEROBIC ONLY Blood Culture results may not be optimal due to an inadequate volume of blood received in culture bottles   Culture   Final    NO GROWTH 5 DAYS Performed at [facility], [place] [address] [place], [place] [address]    Report Status 01/17/2021 FINAL  Final  Culture, blood (routine x 2)     Status: None   Collection Time: 01/12/21  7:13 PM   Specimen: BLOOD RIGHT ARM  Result Value Ref Range Status   Specimen Description BLOOD RIGHT ARM  Final   Special Requests   Final    BOTTLES DRAWN AEROBIC AND ANAEROBIC Blood Culture results may not be optimal due to an inadequate volume of blood received in culture bottles   Culture   Final    NO GROWTH 5 DAYS Performed at [facility], [place] [address] [place], [place] [address]    Report Status 01/17/2021 FINAL  Final  Culture, Urine     Status: Abnormal   Collection Time:  01/13/21  9:20 AM   Specimen: Urine, Random  Result Value Ref Range Status   Specimen Description URINE, RANDOM  Final   Special Requests   Final    NONE Performed at [facility], [place] [address], [place], [place] [address]    Culture MULTIPLE SPECIES PRESENT, SUGGEST RECOLLECTION (A)  Final   Report Status 01/15/2021 FINAL  Final  MRSA PCR Screening     Status: None   Collection Time: 01/13/21  4:13 PM   Specimen:  Nasopharyngeal  Result Value Ref Range Status   MRSA by PCR NEGATIVE NEGATIVE Final    Comment:        The GeneXpert MRSA Assay (FDA approved for NASAL specimens only), is one component of a comprehensive MRSA colonization surveillance program. It is not intended to diagnose MRSA infection nor to guide or monitor treatment for MRSA infections. Performed at [facility], [place] [address] [place], [place] [address]          Radiology Studies: No results found.      Scheduled Meds: . aspirin  81 mg Oral Daily  . chlorhexidine  15 mL Mouth Rinse BID  . Chlorhexidine Gluconate Cloth  6 each Topical Daily  . dexamethasone (DECADRON) injection  4 mg Intravenous Daily  . feeding supplement  237 mL Oral TID BM  . ferrous sulfate  325 mg Oral BID WC  . fluticasone furoate-vilanterol  1 puff Inhalation Daily  . mouth rinse  15 mL Mouth Rinse q12n4p  . metoprolol tartrate  100 mg Oral BID  . multivitamin with minerals  1 tablet Oral Daily  . nicotine  14 mg Transdermal Daily  . pantoprazole  40 mg Oral Daily  . rosuvastatin  10 mg Oral Daily  . sodium chloride flush  10-40 mL Intracatheter Q12H  . vitamin B-12  1,000 mcg Oral Daily   Continuous Infusions: . sodium chloride Stopped (01/18/21 2321)  . levETIRAcetam 500 mg (01/19/21 0939)     LOS: 17 days    Time spent: 45 minutes spent on chart review, discussion with nursing staff, consultants, updating family and interview/physical exam; more than 50% of that time was spent in counseling  and/or coordination of care.    Cheril Slattery J [place] DO [facility] Available via Epic secure chat 7am-7pm After these hours, please refer to coverage provider listed on [email or web address] 01/19/2021, 1:11 PM

## 2021-01-19 NOTE — Progress Notes (Signed)
Nutrition Follow-up  DOCUMENTATION CODES:   Underweight,Severe malnutrition in context of chronic illness  INTERVENTION:   - Ensure Enlive po TID, each supplement provides 350 kcal and 20 grams of protein, thickened to appropriate consistency  - Vital Cuisine Shake TID, each supplement provides 520 kcal and 22 grams of protein  - Magic Cup TID with meals, each supplement provides 290 kcal and 9 grams of protein  - Continue MVI with minerals daily  - Encourage adequate PO intake  NUTRITION DIAGNOSIS:   Severe Malnutrition related to chronic illness,poor appetite as evidenced by percent weight loss,moderate fat depletion,severe fat depletion,severe muscle depletion,per patient/family report.  Ongoing, being addressed via oral nutrition supplements  GOAL:   Patient will meet greater than or equal to 90% of their needs  Progressing  MONITOR:   Diet advancement,Labs,Weight trends,TF tolerance,Skin,I & O's  REASON FOR ASSESSMENT:   Malnutrition Screening Tool    ASSESSMENT:   Pt admitted with AMS, dehydration, hypoglycemia, and SIRS and later found to have SDH. PMH includes CVA, PAD s/p LLE angiography with DES placed to popliteal artery, L foot osteomyelitis s/p transmetatarsal amputation, HTN, COPD, EtOH and tobacco use.  5/13 - s/p BSE, diet advanced todysphagia 2 diet with thin liquids 5/19 - transferred to ICU 5/20 - Cortrak placed (tip gastric) 5/22 - diet advanced to dysphagia 2 with thin liquids 5/23 - pt pulled Cortrak out, diet advanced to dysphagia 3 with thin liquids 5/25 - diet advanced to regular, liquids downgraded to nectar-thick  Discussed pt with RN and during ICU rounds. Spoke with pt at bedside. Pt reports good appetite and that he is trying to eat. Pt reports that he consumed "almost all" of his breakfast (grits, eggs, sausage). Meal completion documentation for breakfast not available in chart. Pt states that he does want to consume more Ensure  supplements. Noted SLP downgraded liquids to nectar-thick due to pt coughing with consuming thin liquids and MBS ordered.  Discussed with pt the importance of adequate PO intake. RD will order additional supplements with meals.  Admit weight: 62.4 kg Current weight: 61.7 kg  Meal Completion: 25-100% since diet advanced on 5/22  Medications reviewed and include: IV decadron, Ensure Enlive TID, ferrous sulfate, MVI with minerals, protonix, vitamin B-12, keppra  Labs reviewed: hemoglobin CBG's: 83-142 x 24 hours  Diet Order:   Diet Order            Diet regular Room service appropriate? Yes; Fluid consistency: Nectar Thick  Diet effective now                 EDUCATION NEEDS:   No education needs have been identified at this time  Skin:  Skin Assessment: Skin Integrity Issues: Other: skin tear to left shoulder  Last BM:  01/19/21  Height:   Ht Readings from Last 1 Encounters:  01/02/21 6\' 3"  (1.905 m)    Weight:   Wt Readings from Last 1 Encounters:  01/19/21 61.7 kg    BMI:  Body mass index is 17 kg/m.  Estimated Nutritional Needs:   Kcal:  2000-2200  Protein:  100-115 grams  Fluid:  >2L/d    Gustavus Bryant, MS, RD, LDN Inpatient Clinical Dietitian Please see AMiON for contact information.

## 2021-01-19 NOTE — Progress Notes (Signed)
  Speech Language Pathology Treatment: Dysphagia  Patient Details Name: Seth [name] MRN: [id number] DOB: [birth year] Today's Date: 01/19/2021 Time: 9323-5573 SLP Time Calculation (min) (ACUTE ONLY): 10 min  Assessment / Plan / Recommendation Clinical Impression  Pt demonstrates improved arousal, but is still midly impulsive. No sitter now, so decreased supervision with meals. Staff have reported occasional coughing with thin liquids. Pt recommended to downgrade to nectar thick liquids with a f/u MBS. He tolerated independent self feeding of this and regular solids today.    HPI HPI: Seth [name] is a 65 y.o. male  presents with Acute mental status decline - likely on by alcohol abuse, poor oral intake causing hypoglycemia, hypomagnesemia, dehydration, AKI and hyperkalemia, with possible DTs. CT showing SDH. PMH significant for history of CVA, PVD s/p LLE angiography with DES placed to popliteal artery, left foot osteomyelitis s/p transmetatarsal hypertension 09/22/2020, COPD, hypertension, alcohol use, and tobacco use. Patient had been seen by ST services and was placed on and tolerating Dys 2 solids, thin liquids diet with most recent SLP intervention on 5/18. On 5/19, patient exhibiting change in cognitive status and repeat CT revealed SDH increased in size from 36mm to 58mm. Patient transferred to ICU, made NPO and Cortrak placed for nutrition. Repeat BSE ordered secondary to change in status to reasses swallow function.      SLP Plan  Continue with current plan of care;MBS       Recommendations  Diet recommendations: Regular;Nectar-thick liquid Liquids provided via: Straw;Cup Medication Administration: Whole meds with puree Supervision: Intermittent supervision to cue for compensatory strategies Compensations: Slow rate;Small sips/bites;Follow solids with liquid Postural Changes and/or Swallow Maneuvers: Seated upright 90 degrees                Oral Care Recommendations: Oral  care BID;Staff/trained caregiver to provide oral care Follow up Recommendations: 24 hour supervision/assistance;Skilled Nursing facility SLP Visit Diagnosis: Dysphagia, unspecified (R13.10) Plan: Continue with current plan of care;MBS       GO               Seth Baltimore, MA [place] Pager [phone] Office [phone]  Seth [name] 01/19/2021, 9:29 AM

## 2021-01-20 ENCOUNTER — Inpatient Hospital Stay (HOSPITAL_COMMUNITY): Payer: Medicare Other

## 2021-01-20 DIAGNOSIS — R6510 Systemic inflammatory response syndrome (SIRS) of non-infectious origin without acute organ dysfunction: Secondary | ICD-10-CM | POA: Diagnosis not present

## 2021-01-20 LAB — GLUCOSE, CAPILLARY
Glucose-Capillary: 114 mg/dL — ABNORMAL HIGH (ref 70–99)
Glucose-Capillary: 117 mg/dL — ABNORMAL HIGH (ref 70–99)
Glucose-Capillary: 142 mg/dL — ABNORMAL HIGH (ref 70–99)
Glucose-Capillary: 163 mg/dL — ABNORMAL HIGH (ref 70–99)
Glucose-Capillary: 76 mg/dL (ref 70–99)
Glucose-Capillary: 99 mg/dL (ref 70–99)

## 2021-01-20 LAB — CBC
HCT: 30.3 % — ABNORMAL LOW (ref 39.0–52.0)
Hemoglobin: 9.8 g/dL — ABNORMAL LOW (ref 13.0–17.0)
MCH: 31.9 pg (ref 26.0–34.0)
MCHC: 32.3 g/dL (ref 30.0–36.0)
MCV: 98.7 fL (ref 80.0–100.0)
Platelets: 1143 10*3/uL (ref 150–400)
RBC: 3.07 MIL/uL — ABNORMAL LOW (ref 4.22–5.81)
RDW: 17.6 % — ABNORMAL HIGH (ref 11.5–15.5)
WBC: 19.3 10*3/uL — ABNORMAL HIGH (ref 4.0–10.5)
nRBC: 0.5 % — ABNORMAL HIGH (ref 0.0–0.2)

## 2021-01-20 LAB — BASIC METABOLIC PANEL
Anion gap: 11 (ref 5–15)
BUN: 20 mg/dL (ref 8–23)
CO2: 21 mmol/L — ABNORMAL LOW (ref 22–32)
Calcium: 9.5 mg/dL (ref 8.9–10.3)
Chloride: 99 mmol/L (ref 98–111)
Creatinine, Ser: 0.92 mg/dL (ref 0.61–1.24)
GFR, Estimated: >60 mL/min (ref >60)
Glucose, Bld: 83 mg/dL (ref 70–99)
Potassium: 4.2 mmol/L (ref 3.5–5.1)
Sodium: 131 mmol/L — ABNORMAL LOW (ref 135–145)

## 2021-01-20 MED ORDER — FERROUS SULFATE 325 (65 FE) MG PO TABS
325.0000 mg | ORAL_TABLET | Freq: Every day | ORAL | Status: DC
  Administered 2021-01-21 – 2021-01-22 (×2): 325 mg via ORAL
  Filled 2021-01-20 (×2): qty 1

## 2021-01-20 MED ORDER — LEVETIRACETAM 500 MG PO TABS
500.0000 mg | ORAL_TABLET | Freq: Two times a day (BID) | ORAL | Status: DC
  Administered 2021-01-20 – 2021-01-22 (×5): 500 mg via ORAL
  Filled 2021-01-20 (×6): qty 1

## 2021-01-20 MED ORDER — DEXAMETHASONE 4 MG PO TABS
4.0000 mg | ORAL_TABLET | Freq: Every day | ORAL | Status: DC
  Administered 2021-01-20: 4 mg via ORAL
  Filled 2021-01-20: qty 1

## 2021-01-20 NOTE — Progress Notes (Signed)
Physical Therapy Treatment Patient Details Name: Seth [name] MRN: [id number] DOB: Jun 05, 1956 Today's Date: 01/20/2021    History of Present Illness Seth [name] is a 65 y.o. male who presents to ED with AMS, 1 fall, and vomiting. Patient admitted with SIRS, AKI, hypoglycemia and dehydration. CT  5/11 (+) acute L SDH.  5/12  he had acute change in mental status and seizure and right hemibody weakness with ? cardiac arrest.   EEG 5/12-513.  Right hemibody weakness resolved 5/13. 5/19 pt with worsening SDH and transfer to ICU.  PMHx significant for HTN, ETOH & tobacco use disorder, COVID-19 ~3 mos ago, Hx of CVA, PVD, L transmet amp 09/22/2020, HTN, COPD    PT Comments    Pt supine in bed on arrival this session.  Upon sitting edge of bed he had bout or urinary incontinence sitting edge of bed.  Pt noted to be sitting in soiled bed.  Performed pericare in standing and then able to progress gt this session with decreased assistance.  Continue to recommend post acute rehab.     Follow Up Recommendations  CIR     Equipment Recommendations  Rolling Hammond with 5" wheels;3in1 (PT);Wheelchair cushion (measurements PT);Wheelchair (measurements PT)    Recommendations for Other Services       Precautions / Restrictions Precautions Precautions: Fall Precaution Comments: orthostatic in past session no symptoms this session. Restrictions Weight Bearing Restrictions: No    Mobility  Bed Mobility Overal bed mobility: Needs Assistance Bed Mobility: Supine to Sit     Supine to sit: Supervision     General bed mobility comments: Increased time and effort this session but able to achieve sitting edge of bed.  Once sitting edge of bed able to perform LE dressing to change socks.    Transfers Overall transfer level: Needs assistance Equipment used: Rolling Heikkila (2 wheeled) Transfers: Sit to/from Stand Sit to Stand: Min assist;+2 safety/equipment         General transfer comment:  Cues for hand placement to push into standing.  Mild unsteadiness this session with no UE support when attempting to perform pericare.  Ambulation/Gait Ambulation/Gait assistance: Min assist Gait Distance (Feet): 25 Feet (x2) Assistive device: Rolling Thwaites (2 wheeled) Gait Pattern/deviations: Trunk flexed;Shuffle;Decreased stride length;Drifts right/left;Step-through pattern     General Gait Details: Cues for upper trunk control and safety with RW.  Performed seated rest break to adjust height of RW and improve posture.   Stairs             Wheelchair Mobility    Modified Rankin (Stroke Patients Only) Modified Rankin (Stroke Patients Only) Pre-Morbid Rankin Score: No significant disability Modified Rankin: Moderately severe disability     Balance Overall balance assessment: Needs assistance;History of Falls Sitting-balance support: Feet supported;No upper extremity supported Sitting balance-[name] Scale: Fair       Standing balance-[name] Scale: Poor Standing balance comment: heavy B UE this session                            Cognition Arousal/Alertness: Awake/alert Behavior During Therapy: WFL for tasks assessed/performed Overall Cognitive Status: Impaired/Different from baseline Area of Impairment: Following commands;Safety/judgement;Problem solving                       Following Commands: Follows one step commands consistently;Follows one step commands with increased time Safety/Judgement: Decreased awareness of deficits;Decreased awareness of safety   Problem Solving: Slow processing;Requires verbal  cues;Requires tactile cues;Decreased initiation        Exercises      General Comments        Pertinent Vitals/Pain Pain Assessment: No/denies pain    Home Living                      Prior Function            PT Goals (current goals can now be found in the care plan section) Acute Rehab PT Goals Patient Stated Goal:  No goals stated. Potential to Achieve Goals: Good Progress towards PT goals: Progressing toward goals    Frequency    Min 3X/week      PT Plan Current plan remains appropriate    Co-evaluation              AM-PAC PT "6 Clicks" Mobility   Outcome Measure  Help needed turning from your back to your side while in a flat bed without using bedrails?: None Help needed moving from lying on your back to sitting on the side of a flat bed without using bedrails?: A Little Help needed moving to and from a bed to a chair (including a wheelchair)?: A Little Help needed standing up from a chair using your arms (e.g., wheelchair or bedside chair)?: A Little Help needed to walk in hospital room?: A Little Help needed climbing 3-5 steps with a railing? : A Lot 6 Click Score: 18    End of Session Equipment Utilized During Treatment: Gait belt Activity Tolerance: Treatment limited secondary to medical complications (Comment);Patient limited by fatigue Patient left: with call bell/phone within reach;in chair;with chair alarm set Nurse Communication: Mobility status PT Visit Diagnosis: Unsteadiness on feet (R26.81);Repeated falls (R29.6);Muscle weakness (generalized) (M62.81);Dizziness and giddiness (R42)     Time: 1206-1228 PT Time Calculation (min) (ACUTE ONLY): 22 min  Charges:  $Gait Training: 8-22 mins                     Seth [name] , PTA Acute Rehabilitation Services Pager [phone] Office [phone]     Seth [name] Seth [name] 01/20/2021, 12:55 PM

## 2021-01-20 NOTE — Progress Notes (Signed)
PROGRESS NOTE    Seth [name]  WJX:[id number] DOB: [birth year] DOA: 01/01/2021 PCP: Lucianne Lei, MD    Brief Narrative:  Seth [name] is a 65 year old male with past medical history significant for CVA, essential hypertension, EtOH abuse, PVD s/p DES to popliteal artery, left foot osteomyelitis, COPD, tobacco use disorder, depression who presented to [name] ED on 01/01/2021 for evaluation of altered mental status.  Patient was found to have left-sided subdural hematoma with 6 mm midline shift.  Neurosurgery was consulted and initially admitted under [facility] service.  On the afternoon of 5/12, patient with acute neuro change with full tonic-clonic seizure.  Patient was given IV Ativan and repeat stat CT ordered with unfortunate development of ventricular tachycardia with brief 20-30-second cardiac arrest with spontaneous resolution of VT and patient regained pulse without chest compressions or medications.  On 5/19 PCCM was reengaged as patient with worsening mental status, worsening subdural hemorrhage and was subsequently transferred to the ICU.  Patient now transferred back to [facility] from PCCM on 01/19/2021.   Assessment & Plan:   Principal Problem:   SIRS (systemic inflammatory response syndrome) (HCC) Active Problems:   COPD (chronic obstructive pulmonary disease) (HCC)   Hyperkalemia   AKI (acute kidney injury) ([place])   Hypoglycemia   Alcohol use   History of CVA (cerebrovascular accident)   VT (ventricular tachycardia) (HCC)   Benign essential HTN   Benign prostatic hyperplasia   Depression   ETOH abuse   Hyponatremia   Protein-calorie malnutrition, severe   Acute encephalopathy   SDH (subdural hematoma) (HCC)   Acute metabolic encephalopathy: Improved Etiology likely multifactorial with subdural hematoma, seizure, and possible EtOH withdrawal symptoms.  Continue treatment as below.  Acute left subdural hematoma with mass-effect/midline shift Patient initially presenting to  [name], ED with altered mental status and was found to have a acute left subdural hematoma.  During this hospitalization, patient with further decompensation with associated tonic-clonic seizure and worsening of mental status.  Repeat CT head on 5/19 shows increased SDH to 14 mm from 10 mm and increased midline shift 8 mm from 4 mm.  CT head without contrast on 01/19/2021 with unchanged size mixed density subdural hematoma overlying left cerebral hemisphere with decreased hyperdense components, unchanged mass-effect left cerebral hemisphere with partial effacement left lateral ventricle, unchanged 8 mm rightward midline shift. --Neurosurgery following, appreciate assistance; currently nonsurgical management --Discontinue dexamethasone today --Keppra 500 mg p.o. twice daily -- No DVT prophylaxis per neurosurgery  Seizure Patient was noted developed seizure activity on 5/12.  EEG 5/12 with moderate/severe diffuse encephalopathy, nonspecific.  No seizures or epileptiform discharges noted.  Repeat EEG on 5/20 with no significant changes and without seizure/epileptiform discharges. --Okay to discontinue dexamethasone today per neurosurgery --Keppra 500 mg twice daily --Seizure precautions  Aspiration pneumonia, suspected During ICU hospitalization, patient was suspected to have aspiration motion of following seizure activity and altered mental status from subdural hematoma.  Patient completed course of antibiotics with vancomycin, Zosyn, ampicillin, Augmentin.  Dysphagia Nursing reports coughing while eating.  Seen by SLP on 5/25 with recommendations of regular diet with nectar thick liquids.  Underwent MBS on 5/26 with SLP recommendations of regular diet with thin liquids --Aspiration precautions  COPD --Breo-Ellipta 200-57mcg 1 puff daily --albuterol neb prn  Hyponatremia Etiology likely secondary to continued EtOH abuse. --Na 131>>135>131 --BMP daily --Encourage increase oral intake, EtOH  cessation  Essential hypertension --Metoprolol tartrate 100 mg p.o. twice daily --Labetalol 10 mg IV every 2 hours as needed  SBP >160 or DBP >100  Leukocytosis Etiology likely secondary to white blood cell de-maginalization. Patient is afebrile. --Further taper of dexamethasone per neurosurgery, now discontinued 5/26 --CBC daily  Thrombocytosis Evaluated by hematology,Dr. Alvy Bimler on 5/23.  Patient developed significant thrombocytosis during this hospitalization; suspect etiology from chronic suppression with alcohol use disorder with rebound effect.  No further work-up or treatment required.  EtOH abuse Patient reports 0.5-1 pint vodka daily.  110 on admission.  Discussed need for complete cessation. 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 Patient with notable muscle wasting/fat depletion as evidenced by weight loss. Nutrition Status: Nutrition Problem: Severe Malnutrition Etiology: chronic illness,poor appetite Signs/Symptoms: percent weight loss,moderate fat depletion,severe fat depletion,severe muscle depletion,per patient/family report Percent weight loss: 10.6 % Interventions: Tube feeding,Prostat,MVI --Dietitian following, appreciate assistance --Continue supplements, encourage increase oral intake    DVT prophylaxis: SCDs   Code Status: Full Code Family Communication: No family present at bedside this morning  Disposition Plan:   Level of care: Telemetry Medical Status is: Inpatient  Remains inpatient appropriate because:Ongoing diagnostic testing needed not appropriate for outpatient work up, Unsafe d/c plan, IV treatments appropriate due to intensity of illness or inability to take PO and Inpatient level of care appropriate due to severity of illness   Dispo: The patient is from: Home              Anticipated d/c is to: SNF              Patient currently is not medically stable to d/c.   Difficult to place patient No   Consultants:   PCCM  Neurosurgery  Neurology  Procedures:   TTE  EEG  Antimicrobials:   Vancomycin 5/7 - 5/8, 5/18 - 5/21  Zosyn 5/7 - 5/8  Ampicillin 5/11 - 5/13  Augmentin 5/13 - 5/18    Significant Hospital Events: Including procedures, antibiotic start and stop dates in addition to other pertinent events    5/7 Admitted for AMS   5/12 seizure followed by brief (20-30sec) cardiac arrest. VT spontaneously resolved and patient regained pulses without any chest compressions or medications.   5/18 worsening mental status noted by RN-- hallucinations, tremor, increased encephalopathy and somnolence. Put on CIWA protocol.  5/19 worsening mental status -- repeat CT H acquired revealing increased subdural hematoma, increased mass effect, effacement of basal cisterns on L. Got 2mg  ativan for tremor vs generalized tonic clonic sz. Transferring to the ICU. Neurology, May and PCCM called   5/25 Transferred to [facility]    Subjective: Patient seen and examined at bedside, resting comfortably.  Sitting in bedside chair eating lunch.  Completed MBS this morning with clearance to regular diet with thin liquids and aspiration precautions.  Discussed case with neurosurgery this morning, Grayling Congress, NP; okay to discontinue dexamethasone and no DVT prophylaxis per Dr. Saintclair Halsted for now.  No family present at bedside.  Declined by CIR due to lack of family support.  Pending SNF placement.  No other  questions or concerns at this time. Patient denies  headache, no dizziness, no fever/chills/night sweats, no nausea/vomiting/diarrhea, no chest pain, no palpitations, no shortness of breath, no abdominal pain.  No other acute concerns overnight or this morning per nursing staff.  Objective: Vitals:   01/20/21 0453 01/20/21 0500 01/20/21 1022 01/20/21 1415  BP: 122/69  100/63 124/71  Pulse: 70  90 76  Resp: 17  18 18   Temp: 98.2 F (36.8 C)  98.1 F (36.7 C) 97.6 F (36.4 C)  TempSrc: Oral  Oral Oral  SpO2: 98%  100% 100%  Weight:  60.9 kg    Height:        Intake/Output Summary (Last 24 hours) at 01/20/2021 1542 Last data filed at 01/20/2021 1516 Gross per 24 hour  Intake 760 ml  Output 950 ml  Net -190 ml   Filed Weights   01/18/21 0500 01/19/21 0500 01/20/21 0500  Weight: 62.4 kg 61.7 kg 60.9 kg    Examination:  General exam: Appears calm and comfortable appears older than stated age, chronically ill in appearance, cachectic/thin with notable muscle wasting and fat depletion Respiratory system: Clear to auscultation. Respiratory effort normal. Cardiovascular system: S1 & S2 heard, RRR. No JVD, murmurs, rubs, gallops or clicks. No pedal edema. Gastrointestinal system: Abdomen is nondistended, soft and nontender. No organomegaly or masses felt. Normal bowel sounds heard. Central nervous system: Alert, oriented to place hospital, not time 2021. No focal neurological deficits. Extremities: Symmetric 5 x 5 power. Skin: No rashes, lesions or ulcers Psychiatry: Judgement and insight appear poor. Mood & affect appropriate.     Data Reviewed: I have personally reviewed following labs and imaging studies  CBC: Recent Labs  Lab 01/14/21 0914 01/15/21 0659 01/16/21 0040 01/17/21 0239 01/18/21 0226 01/19/21 0241 01/20/21 0123  WBC 11.1*   < > 15.3* 16.3* 18.6* 20.0* 19.3*  NEUTROABS 8.1*  --   --   --   --   --   --   HGB 8.2*   < > 8.1* 8.1* 8.5* 8.6* 9.8*  HCT 25.4*   <  > 25.2* 25.2* 26.2* 27.3* 30.3*  MCV 100.0   < > 99.2 99.2 98.5 99.6 98.7  PLT 858*   < > 980* 1,123* 1,073* 1,131* 1,143*   < > = values in this interval not displayed.   Basic Metabolic Panel: Recent Labs  Lab 01/14/21 0914 01/14/21 1656 01/15/21 0659 01/16/21 0040 01/17/21 0239 01/18/21 0226 01/19/21 0241 01/20/21 0123  NA 129*  --  131* 134* 134* 134* 135 131*  K 4.3  --  4.6 4.2 4.4 4.9 4.5 4.2  CL 98  --  99 101 103 102 103 99  CO2 22  --  22 23 22 22 25  21*  GLUCOSE 87  --  119* 127* 103* 84 94 83  BUN 8  --  13 12 13 20 19 20   CREATININE 0.90  --  0.96 0.83 0.80 0.85 0.86 0.92  CALCIUM 9.2  --  9.3 9.2 9.0 9.4 9.7 9.5  MG 2.0 1.9 1.9 2.0  --   --   --   --   PHOS 4.8* 4.4 2.3*  --   --   --   --   --    GFR: Estimated Creatinine Clearance: 70.8 mL/min (by C-G formula based on SCr of 0.92 mg/dL). Liver Function Tests: Recent Labs  Lab 01/14/21 0914  AST 17  ALT 10  ALKPHOS 54  BILITOT 0.8  PROT 6.3*  ALBUMIN 2.7*   No results for input(s):  LIPASE, AMYLASE in the last 168 hours. No results for input(s): AMMONIA in the last 168 hours. Coagulation Profile: Recent Labs  Lab 01/17/21 0239  INR 1.0   Cardiac Enzymes: No results for input(s): CKTOTAL, CKMB, CKMBINDEX, TROPONINI in the last 168 hours. BNP (last 3 results) No results for input(s): PROBNP in the last 8760 hours. HbA1C: No results for input(s): HGBA1C in the last 72 hours. CBG: Recent Labs  Lab 01/19/21 2005 01/20/21 0014 01/20/21 0452 01/20/21 0727 01/20/21 1154  GLUCAP 174* 114* 99 76 117*   Lipid Profile: No results for input(s): CHOL, HDL, LDLCALC, TRIG, CHOLHDL, LDLDIRECT in the last 72 hours. Thyroid Function Tests: No results for input(s): TSH, T4TOTAL, FREET4, T3FREE, THYROIDAB in the last 72 hours. Anemia Panel: No results for input(s): VITAMINB12, FOLATE, FERRITIN, TIBC, IRON, RETICCTPCT in the last 72 hours. Sepsis Labs: Recent Labs  Lab 01/14/21 0914  PROCALCITON <0.10     Recent Results (from the past 240 hour(s))  SARS CORONAVIRUS 2 (TAT 6-24 HRS) Nasopharyngeal Nasopharyngeal Swab     Status: None   Collection Time: 01/11/21 10:05 AM   Specimen: Nasopharyngeal Swab  Result Value Ref Range Status   SARS Coronavirus 2 NEGATIVE NEGATIVE Final    Comment: (NOTE) SARS-CoV-2 target nucleic acids are NOT DETECTED.  The SARS-CoV-2 RNA is generally detectable in upper and lower respiratory specimens during the acute phase of infection. Negative results do not preclude SARS-CoV-2 infection, do not rule out co-infections with other pathogens, and should not be used as the sole basis for treatment or other patient management decisions. Negative results must be combined with clinical observations, patient history, and epidemiological information. The expected result is Negative.  Fact Sheet for Patients: SugarRoll.be  Fact Sheet for Healthcare Providers: https://www.woods-mathews.com/  This test is not yet approved or cleared by the [place] FDA and  has been authorized for detection and/or diagnosis of SARS-CoV-2 by FDA under an Emergency Use Authorization (EUA). This EUA will remain  in effect (meaning this test can be used) for the duration of the COVID-19 declaration under Se ction 564(b)(1) of the Act, 21 U.S.C. section 360bbb-3(b)(1), unless the authorization is terminated or revoked sooner.  Performed at [facility], [place] [address] [place], [place] [address]   Culture, blood (routine x 2)     Status: None   Collection Time: 01/12/21  6:01 PM   Specimen: BLOOD  Result Value Ref Range Status   Specimen Description BLOOD SITE NOT SPECIFIED  Final   Special Requests   Final    BOTTLES DRAWN AEROBIC ONLY Blood Culture results may not be optimal due to an inadequate volume of blood received in culture bottles   Culture   Final    NO GROWTH 5 DAYS Performed at [facility], [place]  [address], [place], [place] [address]    Report Status 01/17/2021 FINAL  Final  Culture, blood (routine x 2)     Status: None   Collection Time: 01/12/21  7:13 PM   Specimen: BLOOD RIGHT ARM  Result Value Ref Range Status   Specimen Description BLOOD RIGHT ARM  Final   Special Requests   Final    BOTTLES DRAWN AEROBIC AND ANAEROBIC Blood Culture results may not be optimal due to an inadequate volume of blood received in culture bottles   Culture   Final    NO GROWTH 5 DAYS Performed at [facility], [place] [address], [place], [place] [address]  Report Status 01/17/2021 FINAL  Final  Culture, Urine     Status: Abnormal   Collection Time: 01/13/21  9:20 AM   Specimen: Urine, Random  Result Value Ref Range Status   Specimen Description URINE, RANDOM  Final   Special Requests   Final    NONE Performed at [facility], [address] [place], [place] [address]    Culture MULTIPLE SPECIES PRESENT, SUGGEST RECOLLECTION (A)  Final   Report Status 01/15/2021 FINAL  Final  MRSA PCR Screening     Status: None   Collection Time: 01/13/21  4:13 PM   Specimen: Nasopharyngeal  Result Value Ref Range Status   MRSA by PCR NEGATIVE NEGATIVE Final    Comment:        The GeneXpert MRSA Assay (FDA approved for NASAL specimens only), is one component of a comprehensive MRSA colonization surveillance program. It is not intended to diagnose MRSA infection nor to guide or monitor treatment for MRSA infections. Performed at [facility], [place] [address], [place], [place] [address]          Radiology Studies: CT HEAD WO CONTRAST  Result Date: 01/19/2021 CLINICAL DATA:  Subdural hemorrhage. EXAM: CT HEAD WITHOUT CONTRAST TECHNIQUE: Contiguous axial images were obtained from the base of the skull through the vertex without intravenous contrast. COMPARISON:  Prior head CT examinations 01/13/2021 and earlier. FINDINGS: Brain: Stable cerebral and cerebellar atrophy. A mixed density  subdural hematoma overlying the left cerebral hemisphere has not significantly changed in size, again measuring up to 2.1 cm in greatest thickness (remeasured on prior). Hyperdense components have become less conspicuous as compared to the prior examination of 01/13/2021. Unchanged mass effect upon the underlying left cerebral hemisphere with partial effacement of the left lateral ventricle. Unchanged 8 mm rightward midline shift measured at the level of the septum pellucidum. As before, there is slight asymmetric effacement of the basal cisterns on the left. Previously demonstrated trace parafalcine subdural hemorrhage is no longer well appreciated. Stable chronic small vessel ischemic disease. 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 Unchanged 9 mm dural-based mass along the right aspect of the falx (overlying the medial right frontal lobe), likely reflecting an incidental meningioma. Vascular: No hyperdense vessel.  Atherosclerotic calcifications. Skull: Normal. Negative for fracture or focal lesion. Sinuses/Orbits: Visualized orbits show no acute finding. Trace scattered bilateral ethmoid sinus mucosal thickening. Partially imaged right maxillary sinus mucous retention cyst. IMPRESSION: Unchanged size of a mixed density subdural hematoma overlying the left cerebral hemisphere as compared to the head CT of 01/13/2021. Hyperdense components within the collection have decreased. Unchanged mass effect upon the underlying left cerebral hemisphere with partial effacement of the left lateral ventricle. Unchanged 8 mm rightward midline shift. As before, there is mild asymmetric effacement of the basal cisterns on the left. Previously demonstrated trace parafalcine subdural hemorrhage is no longer appreciated. Stable parenchymal atrophy and chronic small vessel ischemic disease. 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 Redemonstrated 9 mm right  parafalcine meningioma. Electronically Signed   By: Kellie Simmering DO   On: 01/19/2021 15:25        Scheduled Meds: . aspirin  81 mg Oral Daily  . chlorhexidine  15 mL Mouth Rinse BID  . Chlorhexidine Gluconate Cloth  6 each Topical Daily  . feeding supplement  237 mL Oral TID BM  . [START ON 01/21/2021] ferrous sulfate  325 mg Oral Q breakfast  . fluticasone furoate-vilanterol  1 puff Inhalation Daily  .  levETIRAcetam  500 mg Oral BID  . mouth rinse  15 mL Mouth Rinse q12n4p  . metoprolol tartrate  100 mg Oral BID  . multivitamin with minerals  1 tablet Oral Daily  . nicotine  14 mg Transdermal Daily  . pantoprazole  40 mg Oral Daily  . rosuvastatin  10 mg Oral Daily  . sodium chloride flush  10-40 mL Intracatheter Q12H  . vitamin B-12  1,000 mcg Oral Daily   Continuous Infusions: . sodium chloride 10 mL/hr at 01/19/21 1500     LOS: 18 days    Time spent: 42 minutes spent on chart review, discussion with nursing staff, consultants, updating family and interview/physical exam; more than 50% of that time was spent in counseling and/or coordination of care.    Jaely Silman J [place] DO [facility] Available via Epic secure chat 7am-7pm After these hours, please refer to coverage provider listed on [email or web address] 01/20/2021, 3:42 PM

## 2021-01-20 NOTE — Progress Notes (Signed)
Occupational Therapy Treatment Patient Details Name: Seth [name] MRN: [id number] DOB: 1956/07/16 Today's Date: 01/20/2021    History of present illness Seth [name] is a 65 y.o. male who presents to ED with AMS, 1 fall, and vomiting. Patient admitted with SIRS, AKI, hypoglycemia and dehydration. CT  5/11 (+) acute L SDH.  5/12  he had acute change in mental status and seizure and right hemibody weakness with ? cardiac arrest.   EEG 5/12-513.  Right hemibody weakness resolved 5/13. 5/19 pt with worsening SDH and transfer to ICU.  PMHx significant for HTN, ETOH & tobacco use disorder, COVID-19 ~3 mos ago, Hx of CVA, PVD, L transmet amp 09/22/2020, HTN, COPD   OT comments  Pt making good progress with mobility this session. Pt motivated to walk in the hall, requiring min guard for all functional mobility for safety. Pt had 1 LoB initially when standing from EOB, where he had to sit back down on the bed, after he started moving, pt became more stable and did not have anymore LoB. Acute OT will continue to follow to progress functional mobility and ADL performance.    Follow Up Recommendations  SNF    Equipment Recommendations  3 in 1 bedside commode    Recommendations for Other Services      Precautions / Restrictions Precautions Precautions: Fall Precaution Comments: orthostatic in past session no symptoms this session.       Mobility Bed Mobility Overal bed mobility: Needs Assistance Bed Mobility: Supine to Sit;Sit to Supine     Supine to sit: Supervision Sit to supine: Supervision        Transfers Overall transfer level: Needs assistance Equipment used: Rolling [place] (2 wheeled) Transfers: Sit to/from Stand Sit to Stand: Min guard              Balance Overall balance assessment: Needs assistance;History of Falls Sitting-balance support: Feet supported;No upper extremity supported Sitting balance-[name] Scale: Good Sitting balance - Comments: able to maintain  midline sitting at EOB, close S for safety with activities   Standing balance support: Bilateral upper extremity supported;During functional activity Standing balance-[name] Scale: Fair                             ADL either performed or assessed with clinical judgement   ADL Overall ADL's : Needs assistance/impaired Eating/Feeding: Minimal assistance;Sitting Eating/Feeding Details (indicate cue type and reason): assist to open containers, pt using R hand to stabilize/hold bowl                     Toilet Transfer: Min guard;Ambulation;RW Toilet Transfer Details (indicate cue type and reason): Min guard for safety         Functional mobility during ADLs: Min guard;Rolling [place] General ADL Comments: Pt improved with mobility this session, walked 100+ft with RW and walked 22ft with 1 HHA in room to simulate using a cane.     Vision       Perception     Praxis      Cognition Arousal/Alertness: Awake/alert Behavior During Therapy: WFL for tasks assessed/performed Overall Cognitive Status: Impaired/Different from baseline Area of Impairment: Following commands;Safety/judgement;Problem solving                       Following Commands: Follows one step commands consistently;Follows one step commands with increased time Safety/Judgement: Decreased awareness of deficits;Decreased awareness of safety   Problem Solving:  Slow processing;Requires verbal cues          Exercises     Shoulder Instructions       General Comments VSS on RA    Pertinent Vitals/ Pain       Pain Assessment: No/denies pain  Home Living                                          Prior Functioning/Environment              Frequency  Min 2X/week        Progress Toward Goals  OT Goals(current goals can now be found in the care plan section)  Progress towards OT goals: Progressing toward goals  Acute Rehab OT Goals Patient Stated Goal: To  go home OT Goal Formulation: With patient Time For Goal Achievement: 02/01/21 Potential to Achieve Goals: Good ADL Goals Pt Will Perform Grooming: with supervision;standing Pt Will Perform Upper Body Dressing: with modified independence;sitting Pt Will Perform Lower Body Dressing: with supervision;sit to/from stand Pt Will Transfer to Toilet: with supervision;ambulating Pt Will Perform Toileting - Clothing Manipulation and hygiene: with supervision;sit to/from stand Additional ADL Goal #1: Pt will demonstrate anticipatory awareness during ADL routine, incorporate R UE during ADL tasks without cueing.  Plan Discharge plan needs to be updated;Frequency remains appropriate    Co-evaluation                 AM-PAC OT "6 Clicks" Daily Activity     Outcome Measure   Help from another person eating meals?: A Little Help from another person taking care of personal grooming?: A Little Help from another person toileting, which includes using toliet, bedpan, or urinal?: A Little Help from another person bathing (including washing, rinsing, drying)?: A Lot Help from another person to put on and taking off regular upper body clothing?: A Little Help from another person to put on and taking off regular lower body clothing?: A Little 6 Click Score: 17    End of Session Equipment Utilized During Treatment: Gait belt;Rolling [place]  OT Visit Diagnosis: Unsteadiness on feet (R26.81);Other abnormalities of gait and mobility (R26.89);Muscle weakness (generalized) (M62.81)   Activity Tolerance Patient tolerated treatment well   Patient Left in bed;with call bell/phone within reach;with bed alarm set   Nurse Communication Mobility status        Time: 4076-8088 OT Time Calculation (min): 23 min  Charges: OT General Charges $OT Visit: 1 Visit OT Treatments $Self Care/Home Management : 8-22 mins $Therapeutic Activity: 8-22 mins  Alexys Lobello H., OTR/L Acute Rehabilitation  Irlene Crudup Elane  Kelissa Merlin 01/20/2021, 6:04 PM

## 2021-01-20 NOTE — Progress Notes (Signed)
Modified Barium Swallow Progress Note  Patient Details  Name: Seth [name] MRN: [id number] Date of Birth: 30-Aug-1955  Today's Date: 01/20/2021  Modified Barium Swallow completed.  Full report located under Chart Review in the Imaging Section.  Brief recommendations include the following:  Clinical Impression  Pt demonstrates relatively good oral phase given ill fitting dentures as well as good oropharyngeal strength. His swallow impairment is characterized by slightly delayed laryngeal elevation and closure with thin and nectar thick liquids arriving in the pyriforms prior to full elevation and excursion. This pushes bolus in the pyriforms into the vestibule during the swallow with instances of trace silent aspiraiton with thin and nectar thick liquids. Pt could not fully achieve a chin tuck due reduced cervical ROM (prior fusion) and a partial position resulted in more significant sensed aspiration with hard cough and ejection of aspirate. Given equal liklihood of trace aspiraiton with both thin and thick liquids as well as adequate sensation of significant volume of aspiration with ejection, recommend this pt resume a regular diet with thin liquids. Pt will be observed to cough at times, but should be allowed to feed himself with occasional cues to slow rate. In testing pt had similar function with large consecutive self fed straw sips and cup sips as he did with total assist controlled sips. Will f/u for staff educationa nd pt tolerance though there is minimal benefit of prolonged SLP intervention for dysphagia.   Swallow Evaluation Recommendations       SLP Diet Recommendations: Regular solids;Thin liquid   Liquid Administration via: Cup;Straw   Medication Administration: Whole meds with puree   Supervision: Patient able to self feed;Intermittent supervision to cue for compensatory strategies   Compensations: Slow rate;Small sips/bites;Clear throat intermittently       Oral Care  Recommendations: Oral care BID      Herbie [place], MA [place] Pager [phone] Office [phone]   Lynann Beaver 01/20/2021,11:15 AM

## 2021-01-21 DIAGNOSIS — R6510 Systemic inflammatory response syndrome (SIRS) of non-infectious origin without acute organ dysfunction: Secondary | ICD-10-CM | POA: Diagnosis not present

## 2021-01-21 LAB — BASIC METABOLIC PANEL
Anion gap: 11 (ref 5–15)
BUN: 25 mg/dL — ABNORMAL HIGH (ref 8–23)
CO2: 22 mmol/L (ref 22–32)
Calcium: 9.2 mg/dL (ref 8.9–10.3)
Chloride: 100 mmol/L (ref 98–111)
Creatinine, Ser: 0.99 mg/dL (ref 0.61–1.24)
GFR, Estimated: >60 mL/min (ref >60)
Glucose, Bld: 86 mg/dL (ref 70–99)
Potassium: 4.8 mmol/L (ref 3.5–5.1)
Sodium: 133 mmol/L — ABNORMAL LOW (ref 135–145)

## 2021-01-21 LAB — SARS CORONAVIRUS 2 (TAT 6-24 HRS): SARS Coronavirus 2: NEGATIVE

## 2021-01-21 LAB — GLUCOSE, CAPILLARY
Glucose-Capillary: 83 mg/dL (ref 70–99)
Glucose-Capillary: 94 mg/dL (ref 70–99)
Glucose-Capillary: 114 mg/dL — ABNORMAL HIGH (ref 70–99)
Glucose-Capillary: 128 mg/dL — ABNORMAL HIGH (ref 70–99)

## 2021-01-21 LAB — CBC
HCT: 27.6 % — ABNORMAL LOW (ref 39.0–52.0)
Hemoglobin: 8.3 g/dL — ABNORMAL LOW (ref 13.0–17.0)
MCH: 31.3 pg (ref 26.0–34.0)
MCHC: 30.1 g/dL (ref 30.0–36.0)
MCV: 104.2 fL — ABNORMAL HIGH (ref 80.0–100.0)
Platelets: 982 10*3/uL (ref 150–400)
RBC: 2.65 MIL/uL — ABNORMAL LOW (ref 4.22–5.81)
RDW: 18.1 % — ABNORMAL HIGH (ref 11.5–15.5)
WBC: 16.8 10*3/uL — ABNORMAL HIGH (ref 4.0–10.5)
nRBC: 0.5 % — ABNORMAL HIGH (ref 0.0–0.2)

## 2021-01-21 NOTE — TOC Progression Note (Addendum)
Transition of Care [facility]) - Progression Note    Patient Details  Name: ORYON GARY MRN: [id number] Date of Birth: 10/04/1955  Transition of Care [facility] CM/SW [place], [place] Phone Number: 01/21/2021, 2:13 PM  Clinical Narrative:     CSW gave pt and daughter [name] bued offers. [name] chose [place]. CSW confirmed with [facility] that they can accept pt tomorrow at discharge. CSW asked MD for covid test. CSW started insurance auth in the [facility] portal, reference number [id number].Marland Kitchen   Expected Discharge Plan: [place] Barriers to Discharge: Insurance Authorization  Expected Discharge Plan and Services Expected Discharge Plan: [place] In-house Referral: Clinical Social Work   Post Acute Care Choice: [place] Living arrangements for the past 2 months: [place] Determinants of Health (SDOH) Interventions    Readmission Risk Interventions Readmission Risk Prevention Plan 06/04/2019  Transportation Screening Complete  PCP or Specialist Appt within 5-7 Days Complete  Home Care Screening Complete  Medication Review (RN CM) Complete  Some recent data might be hidden   Emeterio Reeve, Latanya Presser, [place] Social Worker [phone]

## 2021-01-21 NOTE — Progress Notes (Signed)
PROGRESS NOTE    [name]  MVH:[id number] DOB: [birth year] DOA: 01/01/2021 PCP: Lucianne Lei, MD    Brief Narrative:  Seth [name] is a 65 year old male with past medical history significant for CVA, essential hypertension, EtOH abuse, PVD s/p DES to popliteal artery, left foot osteomyelitis, COPD, tobacco use disorder, depression who presented to [name] ED on 01/01/2021 for evaluation of altered mental status.  Patient was found to have left-sided subdural hematoma with 6 mm midline shift.  Neurosurgery was consulted and initially admitted under [facility] service.  On the afternoon of 5/12, patient with acute neuro change with full tonic-clonic seizure.  Patient was given IV Ativan and repeat stat CT ordered with unfortunate development of ventricular tachycardia with brief 20-30-second cardiac arrest with spontaneous resolution of VT and patient regained pulse without chest compressions or medications.  On 5/19 PCCM was reengaged as patient with worsening mental status, worsening subdural hemorrhage and was subsequently transferred to the ICU.  Patient now transferred back to [facility] from PCCM on 01/19/2021.   Assessment & Plan:   Principal Problem:   SIRS (systemic inflammatory response syndrome) (HCC) Active Problems:   COPD (chronic obstructive pulmonary disease) (HCC)   Hyperkalemia   AKI (acute kidney injury) ([place])   Hypoglycemia   Alcohol use   History of CVA (cerebrovascular accident)   VT (ventricular tachycardia) (HCC)   Benign essential HTN   Benign prostatic hyperplasia   Depression   ETOH abuse   Hyponatremia   Protein-calorie malnutrition, severe   Acute encephalopathy   SDH (subdural hematoma) (HCC)   Acute metabolic encephalopathy: Improved Etiology likely multifactorial with subdural hematoma, seizure, and possible EtOH withdrawal symptoms.  Continue treatment as below.  Acute left subdural hematoma with mass-effect/midline shift Patient initially presenting to  [name] ED with altered mental status and was found to have a acute left subdural hematoma.  During this hospitalization, patient with further decompensation with associated tonic-clonic seizure and worsening of mental status.  Repeat CT head on 5/19 shows increased SDH to 14 mm from 10 mm and increased midline shift 8 mm from 4 mm.  CT head without contrast on 01/19/2021 with unchanged size mixed density subdural hematoma overlying left cerebral hemisphere with decreased hyperdense components, unchanged mass-effect left cerebral hemisphere with partial effacement left lateral ventricle, unchanged 8 mm rightward midline shift. --Neurosurgery following, appreciate assistance; currently nonsurgical management --Keppra 500 mg p.o. twice daily --No DVT prophylaxis per neurosurgery --May need bur holes at some point per neurosurgery, plan outpatient follow-up 2 weeks  Seizure Patient was noted developed seizure activity on 5/12.  EEG 5/12 with moderate/severe diffuse encephalopathy, nonspecific.  No seizures or epileptiform discharges noted.  Repeat EEG on 5/20 with no significant changes and without seizure/epileptiform discharges. --Keppra 500 mg twice daily --Seizure precautions  Aspiration pneumonia, suspected During ICU hospitalization, patient was suspected to have aspiration motion of following seizure activity and altered mental status from subdural hematoma.  Patient completed course of antibiotics with vancomycin, Zosyn, ampicillin, Augmentin.  Dysphagia Nursing reports coughing while eating.  Seen by SLP on 5/25 with recommendations of regular diet with nectar thick liquids.  Underwent MBS on 5/26 with SLP recommendations of regular diet with thin liquids --Aspiration precautions  COPD --Breo-Ellipta 200-16mcg 1 puff daily --albuterol neb prn  Hyponatremia Etiology likely secondary to continued EtOH abuse. --Na 131>>135>131>133 --Encourage increase oral intake, EtOH  cessation  Essential hypertension --Metoprolol tartrate 100 mg p.o. twice daily --Labetalol 10 mg IV every 2 hours as  needed SBP >160 or DBP >100  Leukocytosis Etiology likely secondary to white blood cell de-maginalization from steroid use. Patient is afebrile.  Dexamethasone now tapered off.  Thrombocytosis Evaluated by hematology,Dr. Alvy Bimler on 5/23.  Patient developed significant thrombocytosis during this hospitalization; suspect etiology from chronic suppression with alcohol use disorder with rebound effect.  No further work-up or treatment required.  EtOH abuse Patient reports 0.5-1 pint vodka daily.  110 on admission.  Discussed need for complete cessation. 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 Patient with notable muscle wasting/fat depletion as evidenced by weight loss. Nutrition Status: Nutrition Problem: Severe Malnutrition Etiology: chronic illness,poor appetite Signs/Symptoms: percent weight loss,moderate fat depletion,severe fat depletion,severe muscle depletion,per patient/family report Percent weight loss: 10.6 % Interventions: Tube feeding,Prostat,MVI --Dietitian following, appreciate assistance --Continue supplements, encourage increase oral intake    DVT prophylaxis: SCDs   Code Status: Full Code Family Communication: No family present at bedside this morning  Disposition Plan:  Level of care: Telemetry  Medical Status is: Inpatient  Remains inpatient appropriate because:Ongoing diagnostic testing needed not appropriate for outpatient work up, Unsafe d/c plan, IV treatments appropriate due to intensity of illness or inability to take PO and Inpatient level of care appropriate due to severity of illness   Dispo: The patient is from: Home              Anticipated d/c is to: SNF              Patient currently is not medically stable to d/c.   Difficult to place patient No   Consultants:   PCCM  Neurosurgery  Neurology  Procedures:   TTE  EEG  Antimicrobials:   Vancomycin 5/7 - 5/8, 5/18 - 5/21  Zosyn 5/7 - 5/8  Ampicillin 5/11 - 5/13  Augmentin 5/13 - 5/18    Significant Hospital Events: Including procedures, antibiotic start and stop dates in addition to other pertinent events    5/7 Admitted for AMS   5/12 seizure followed by brief (20-30sec) cardiac arrest. VT spontaneously resolved and patient regained pulses without any chest compressions or medications.   5/18 worsening mental status noted by RN-- hallucinations, tremor, increased encephalopathy and somnolence. Put on CIWA protocol.  5/19 worsening mental status -- repeat CT H acquired revealing increased subdural hematoma, increased mass effect, effacement of basal cisterns on L. Got 2mg  ativan for tremor vs generalized tonic clonic sz. Transferring to the ICU. Neurology, [place] and PCCM called   5/25 Transferred to [facility]    Subjective: Patient seen and examined at bedside, resting comfortably.  Sitting in bedside chair eating breakfast.  No specific complaints this morning.  Plan discharge to SNF tomorrow.  Seen by neurosurgery this morning with outpatient follow-up recommended 2 weeks.  No other questions or concerns at this time. Patient denies headache, no dizziness, no fever/chills/night sweats, no nausea/vomiting/diarrhea, no chest pain, no palpitations, no shortness of breath, no abdominal pain.  No other  acute concerns overnight or this morning per nursing staff.  Objective: Vitals:  01/21/21 0426 01/21/21 0523 01/21/21 0802 01/21/21 1343  BP:  123/69  102/60  Pulse:  76  76  Resp:  18  15  Temp:  98.1 F (36.7 C)  97.7 F (36.5 C)  TempSrc:  Oral  Oral  SpO2:  100% 99% 91%  Weight: 62.5 kg     Height:        Intake/Output Summary (Last 24 hours) at 01/21/2021 1358 Last data filed at 01/21/2021 0841 Gross per 24 hour  Intake 1140 ml  Output 400 ml  Net 740 ml   Filed Weights   01/19/21 0500 01/20/21 0500 01/21/21 0426  Weight: 61.7 kg 60.9 kg 62.5 kg    Examination:  General exam: Appears calm and comfortable appears older than stated age, chronically ill in appearance, cachectic/thin with notable muscle wasting and fat depletion Respiratory system: Clear to auscultation. Respiratory effort normal. Cardiovascular system: S1 & S2 heard, RRR. No JVD, murmurs, rubs, gallops or clicks. No pedal edema. Gastrointestinal system: Abdomen is nondistended, soft and nontender. No organomegaly or masses felt. Normal bowel sounds heard. Central nervous system: Alert, oriented to place [facility], person: Alyson Reedy, not time 2011. No focal neurological deficits. Extremities: Symmetric 5 x 5 power. Skin: No rashes, lesions or ulcers Psychiatry: Judgement and insight appear poor. Mood & affect appropriate.     Data Reviewed: I have personally reviewed following labs and imaging studies  CBC: Recent Labs  Lab 01/17/21 0239 01/18/21 0226 01/19/21 0241 01/20/21 0123 01/21/21 0050  WBC 16.3* 18.6* 20.0* 19.3* 16.8*  HGB 8.1* 8.5* 8.6* 9.8* 8.3*  HCT 25.2* 26.2* 27.3* 30.3* 27.6*  MCV 99.2 98.5 99.6 98.7 104.2*  PLT 1,123* 1,073* 1,131* 1,143* 845*   Basic Metabolic Panel: Recent Labs  Lab 01/14/21 1656 01/15/21 0659 01/15/21 0659 01/16/21 0040 01/17/21 0239 01/18/21 0226 01/19/21 0241 01/20/21 0123 01/21/21 0050  NA  --  131*   < > 134* 134* 134*  135 131* 133*  K  --  4.6   < > 4.2 4.4 4.9 4.5 4.2 4.8  CL  --  99   < > 101 103 102 103 99 100  CO2  --  22   < > 23 22 22 25  21* 22  GLUCOSE  --  119*   < > 127* 103* 84 94 83 86  BUN  --  13   < > 12 13 20 19 20  25*  CREATININE  --  0.96   < > 0.83 0.80 0.85 0.86 0.92 0.99  CALCIUM  --  9.3   < > 9.2 9.0 9.4 9.7 9.5 9.2  MG 1.9 1.9  --  2.0  --   --   --   --   --   PHOS 4.4 2.3*  --   --   --   --   --   --   --    < > = values in this interval not displayed.   GFR: Estimated Creatinine Clearance: 67.5 mL/min (by C-G formula based on SCr of 0.99 mg/dL). Liver Function Tests: No results for input(s): AST, ALT, ALKPHOS, BILITOT, PROT, ALBUMIN in the last 168 hours. No results for input(s): LIPASE, AMYLASE in the last 168 hours. No results for input(s): AMMONIA in the last 168 hours. Coagulation Profile: Recent Labs  Lab 01/17/21 0239  INR 1.0   Cardiac Enzymes: No results for input(s): CKTOTAL, CKMB, CKMBINDEX, TROPONINI in the last 168 hours. BNP (last 3 results) No results for  input(s): PROBNP in the last 8760 hours. HbA1C: No results for input(s): HGBA1C in the last 72 hours. CBG: Recent Labs  Lab 01/20/21 1154 01/20/21 1621 01/20/21 2227 01/21/21 0736 01/21/21 1138  GLUCAP 117* 142* 163* 83 94   Lipid Profile: No results for input(s): CHOL, HDL, LDLCALC, TRIG, CHOLHDL, LDLDIRECT in the last 72 hours. Thyroid Function Tests: No results for input(s): TSH, T4TOTAL, FREET4, T3FREE, THYROIDAB in the last 72 hours. Anemia Panel: No results for input(s): VITAMINB12, FOLATE, FERRITIN, TIBC, IRON, RETICCTPCT in the last 72 hours. Sepsis Labs: No results for input(s): PROCALCITON, LATICACIDVEN in the last 168 hours.  Recent Results (from the past 240 hour(s))  Culture, blood (routine x 2)     Status: None   Collection Time: 01/12/21  6:01 PM   Specimen: BLOOD  Result Value Ref Range Status   Specimen Description BLOOD SITE NOT SPECIFIED  Final   Special Requests    Final    BOTTLES DRAWN AEROBIC ONLY Blood Culture results may not be optimal due to an inadequate volume of blood received in culture bottles   Culture   Final    NO GROWTH 5 DAYS Performed at [facility], [place] [address], [place], [place] [address]    Report Status 01/17/2021 FINAL  Final  Culture, blood (routine x 2)     Status: None   Collection Time: 01/12/21  7:13 PM   Specimen: BLOOD RIGHT ARM  Result Value Ref Range Status   Specimen Description BLOOD RIGHT ARM  Final   Special Requests   Final    BOTTLES DRAWN AEROBIC AND ANAEROBIC Blood Culture results may not be optimal due to an inadequate volume of blood received in culture bottles   Culture   Final    NO GROWTH 5 DAYS Performed at [facility], [place] [address], [place], [place] [address]    Report Status 01/17/2021 FINAL  Final  Culture, Urine     Status: Abnormal   Collection Time: 01/13/21  9:20 AM   Specimen: Urine, Random  Result Value Ref Range Status   Specimen Description URINE, RANDOM  Final   Special Requests   Final    NONE Performed at [facility], [place] [address], [place], [place] [address]    Culture MULTIPLE SPECIES PRESENT, SUGGEST RECOLLECTION (A)  Final   Report Status 01/15/2021 FINAL  Final  MRSA PCR Screening     Status: None   Collection Time: 01/13/21  4:13 PM   Specimen: Nasopharyngeal  Result Value Ref Range Status   MRSA by PCR NEGATIVE NEGATIVE Final    Comment:        The GeneXpert MRSA Assay (FDA approved for NASAL specimens only), is one component of a comprehensive MRSA colonization surveillance program. It is not intended to diagnose MRSA infection nor to guide or monitor treatment for MRSA infections. Performed at [facility], [place] [address] [place], [place] [address]          Radiology Studies: CT HEAD WO CONTRAST  Result Date: 01/19/2021 CLINICAL DATA:  Subdural hemorrhage. EXAM: CT HEAD WITHOUT CONTRAST TECHNIQUE: Contiguous axial images  were obtained from the base of the skull through the vertex without intravenous contrast. COMPARISON:  Prior head CT examinations 01/13/2021 and earlier. FINDINGS: Brain: Stable cerebral and cerebellar atrophy. A mixed density subdural hematoma overlying the left cerebral hemisphere has not significantly changed in size, again measuring up to 2.1 cm in greatest thickness (remeasured on prior).  Hyperdense components have become less conspicuous as compared to the prior examination of 01/13/2021. Unchanged mass effect upon the underlying left cerebral hemisphere with partial effacement of the left lateral ventricle. Unchanged 8 mm rightward midline shift measured at the level of the septum pellucidum. As before, there is slight asymmetric effacement of the basal cisterns on the left. Previously demonstrated trace parafalcine subdural hemorrhage is no longer well appreciated. Stable chronic small vessel ischemic disease. 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 Unchanged 9 mm dural-based mass along the right aspect of the falx (overlying the medial right frontal lobe), likely reflecting an incidental meningioma. Vascular: No hyperdense vessel.  Atherosclerotic calcifications. Skull: Normal. Negative for fracture or focal lesion. Sinuses/Orbits: Visualized orbits show no acute finding. Trace scattered bilateral ethmoid sinus mucosal thickening. Partially imaged right maxillary sinus mucous retention cyst. IMPRESSION: Unchanged size of a mixed density subdural hematoma overlying the left cerebral hemisphere as compared to the head CT of 01/13/2021. Hyperdense components within the collection have decreased. Unchanged mass effect upon the underlying left cerebral hemisphere with partial effacement of the left lateral ventricle. Unchanged 8 mm rightward midline shift. As before, there is mild asymmetric effacement of the basal cisterns on the left. Previously demonstrated trace  parafalcine subdural hemorrhage is no longer appreciated. Stable parenchymal atrophy and chronic small vessel ischemic disease. 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 Redemonstrated 9 mm right parafalcine meningioma. Electronically Signed   By: Kellie Simmering DO   On: 01/19/2021 15:25        Scheduled Meds: . aspirin  81 mg Oral Daily  . chlorhexidine  15 mL Mouth Rinse BID  . feeding supplement  237 mL Oral TID BM  . ferrous sulfate  325 mg Oral Q breakfast  . fluticasone furoate-vilanterol  1 puff Inhalation Daily  . levETIRAcetam  500 mg Oral BID  . mouth rinse  15 mL Mouth Rinse q12n4p  . metoprolol tartrate  100 mg Oral BID  . multivitamin with minerals  1 tablet Oral Daily  . nicotine  14 mg Transdermal Daily  . pantoprazole  40 mg Oral Daily  . rosuvastatin  10 mg Oral Daily  . sodium chloride flush  10-40 mL Intracatheter Q12H  . vitamin B-12  1,000 mcg Oral Daily   Continuous Infusions: . sodium chloride 10 mL/hr at 01/19/21 1500     LOS: 19 days    Time spent: 36 minutes spent on chart review, discussion with nursing staff, consultants, updating family and interview/physical exam; more than 50% of that time was spent in counseling and/or coordination of care.    Cleburn Maiolo J [place] DO [facility] Available via Epic secure chat 7am-7pm After these hours, please refer to coverage provider listed on [email or web address] 01/21/2021, 1:58 PM

## 2021-01-21 NOTE — Progress Notes (Signed)
Patient ID: Seth [name], male   DOB: [birth year], 65 y.o.   MRN: [id number] Patient seems to be doing well no headache improved mentation ambulating no weakness  Awake and alert no pronator drift  65 year old with left-sided chronic subdural and seizures currently recovering from aspiration pneumonia and seizure activity.  Continue observation may need bur holes at some point the patient can be discharged with scheduled follow-up in 2 weeks.  No new neurosurgical recommendations

## 2021-01-22 DIAGNOSIS — E875 Hyperkalemia: Secondary | ICD-10-CM | POA: Diagnosis not present

## 2021-01-22 DIAGNOSIS — Z720 Tobacco use: Secondary | ICD-10-CM | POA: Diagnosis not present

## 2021-01-22 DIAGNOSIS — R6510 Systemic inflammatory response syndrome (SIRS) of non-infectious origin without acute organ dysfunction: Secondary | ICD-10-CM | POA: Diagnosis not present

## 2021-01-22 DIAGNOSIS — E872 Acidosis: Secondary | ICD-10-CM | POA: Diagnosis not present

## 2021-01-22 DIAGNOSIS — R5381 Other malaise: Secondary | ICD-10-CM | POA: Diagnosis not present

## 2021-01-22 DIAGNOSIS — F32A Depression, unspecified: Secondary | ICD-10-CM | POA: Diagnosis not present

## 2021-01-22 DIAGNOSIS — R531 Weakness: Secondary | ICD-10-CM | POA: Diagnosis not present

## 2021-01-22 DIAGNOSIS — D649 Anemia, unspecified: Secondary | ICD-10-CM | POA: Diagnosis not present

## 2021-01-22 DIAGNOSIS — J449 Chronic obstructive pulmonary disease, unspecified: Secondary | ICD-10-CM | POA: Diagnosis not present

## 2021-01-22 DIAGNOSIS — I639 Cerebral infarction, unspecified: Secondary | ICD-10-CM | POA: Diagnosis not present

## 2021-01-22 DIAGNOSIS — E46 Unspecified protein-calorie malnutrition: Secondary | ICD-10-CM | POA: Diagnosis not present

## 2021-01-22 DIAGNOSIS — S065X9A Traumatic subdural hemorrhage with loss of consciousness of unspecified duration, initial encounter: Secondary | ICD-10-CM | POA: Diagnosis not present

## 2021-01-22 DIAGNOSIS — N179 Acute kidney failure, unspecified: Secondary | ICD-10-CM | POA: Diagnosis not present

## 2021-01-22 DIAGNOSIS — E162 Hypoglycemia, unspecified: Secondary | ICD-10-CM | POA: Diagnosis not present

## 2021-01-22 DIAGNOSIS — I10 Essential (primary) hypertension: Secondary | ICD-10-CM | POA: Diagnosis not present

## 2021-01-22 DIAGNOSIS — U071 COVID-19: Secondary | ICD-10-CM | POA: Diagnosis not present

## 2021-01-22 DIAGNOSIS — R000 Tachycardia, unspecified: Secondary | ICD-10-CM | POA: Diagnosis not present

## 2021-01-22 DIAGNOSIS — M869 Osteomyelitis, unspecified: Secondary | ICD-10-CM | POA: Diagnosis not present

## 2021-01-22 DIAGNOSIS — Z743 Need for continuous supervision: Secondary | ICD-10-CM | POA: Diagnosis not present

## 2021-01-22 LAB — GLUCOSE, CAPILLARY
Glucose-Capillary: 87 mg/dL (ref 70–99)
Glucose-Capillary: 116 mg/dL — ABNORMAL HIGH (ref 70–99)

## 2021-01-22 MED ORDER — CYANOCOBALAMIN 1000 MCG PO TABS: 1000.0000 ug | ORAL_TABLET | Freq: Every day | ORAL | Status: DC

## 2021-01-22 MED ORDER — LEVETIRACETAM 500 MG PO TABS: 500.0000 mg | ORAL_TABLET | Freq: Two times a day (BID) | ORAL | Status: DC

## 2021-01-22 MED ORDER — METOPROLOL TARTRATE 100 MG PO TABS: 100.0000 mg | ORAL_TABLET | Freq: Two times a day (BID) | ORAL | Status: DC

## 2021-01-22 MED ORDER — FERROUS SULFATE 325 (65 FE) MG PO TABS: 325.0000 mg | ORAL_TABLET | Freq: Every day | ORAL | 3 refills | Status: DC

## 2021-01-22 NOTE — Progress Notes (Signed)
Gave report to [employer] RN at [facility], pt will be going to rm [phone].

## 2021-01-22 NOTE — Discharge Summary (Signed)
Physician Discharge Summary  Seth [name] XBL:[id number] DOB: 06-27-1956 DOA: 01/01/2021  PCP: Lucianne Lei, MD  Admit date: 01/01/2021 Discharge date: 01/22/2021  Admitted From: Home Disposition: [facility]  Recommendations for Outpatient Follow-up:  1. Follow up with PCP in 1-2 weeks 2. Follow-up with neurosurgery 1-2 weeks 3. Please obtain BMP/CBC in one week   Discharge Condition: Stable CODE STATUS: Full code Diet recommendation: Heart healthy diet  History of present illness:  Seth [name] is a 65 year old male with past medical history significant for CVA, essential hypertension, EtOH abuse, PVD s/p DES to popliteal artery, left foot osteomyelitis, COPD, tobacco use disorder, depression who presented to [name] ED on 01/01/2021 for evaluation of altered mental status.  Patient was found to have left-sided subdural hematoma with 6 mm midline shift.  Neurosurgery was consulted and initially admitted under [facility] service.  On the afternoon of 5/12, patient with acute neuro change with full tonic-clonic seizure.  Patient was given IV Ativan and repeat stat CT ordered with unfortunate development of ventricular tachycardia with brief 20-30-second cardiac arrest with spontaneous resolution of VT and patient regained pulse without chest compressions or medications.  On 5/19 PCCM was reengaged as patient with worsening mental status, worsening subdural hemorrhage and was subsequently transferred to the ICU.  Patient now transferred back to [facility] from PCCM on 01/19/2021.  Hospital course:  Acute metabolic encephalopathy: Improved Etiology likely multifactorial with subdural hematoma, seizure, and possible EtOH withdrawal symptoms.   Acute left subdural hematoma with mass-effect/midline shift Patient initially presenting to [name], ED with altered mental status and was found to have a acute left subdural hematoma.  During this hospitalization, patient with further decompensation  with associated tonic-clonic seizure and worsening of mental status.  Repeat CT head on 5/19 shows increased SDH to 14 mm from 10 mm and increased midline shift 8 mm from 4 mm.  CT head without contrast on 01/19/2021 with unchanged size mixed density subdural hematoma overlying left cerebral hemisphere with decreased hyperdense components, unchanged mass-effect left cerebral hemisphere with partial effacement left lateral ventricle, unchanged 8 mm rightward midline shift. Neurosurgery was consulted and followed during the hospital course; currently nonsurgical management.  Initially was placed on IV Decadron which was slowly weaned off.  Continue Keppra 500 mg p.o. twice daily.  No DVT prophylaxis per neurosurgery. May need bur holes at some point per neurosurgery, plan outpatient follow-up 2 weeks  Seizure Patient was noted developed seizure activity on 5/12.  EEG 5/12 with moderate/severe diffuse encephalopathy, nonspecific.  No seizures or epileptiform discharges noted.  Repeat EEG on 5/20 with no significant changes and without seizure/epileptiform discharges. Keppra 500 mg twice daily  Aspiration pneumonia, suspected During ICU hospitalization, patient was suspected to have aspiration motion of following seizure activity and altered mental status from subdural hematoma.  Patient completed course of antibiotics with vancomycin, Zosyn, ampicillin, Augmentin.  Dysphagia Nursing reports coughing while eating.  Seen by SLP on 5/25 with recommendations of regular diet with nectar thick liquids.  Underwent MBS on 5/26 with SLP recommendations of regular diet with thin liquids.  Continue aspiration precautions  COPD Breo-Ellipta 200-66mcg 1 puff daily, Incruse Ellipta, albuterol prn  Hyponatremia Etiology likely secondary to continued EtOH abuse.  Sodium 133 at time of discharge.  Recommend repeat BMP 1 week.  Essential hypertension Metoprolol tartrate 100 mg p.o. twice  daily  Leukocytosis Etiology likely secondary to white blood cell de-maginalization from steroid use. Patient is afebrile.  Dexamethasone now tapered off.  Repeat CBC 1 week.  Thrombocytosis Evaluated by hematology,Dr. Alvy Bimler on 5/23.  Patient developed significant thrombocytosis during this hospitalization; suspect etiology from chronic suppression with alcohol use disorder with rebound effect.  No further work-up or treatment required.  EtOH abuse Patient reports 0.5-1 pint vodka daily.  110 on admission.  Discussed need for complete cessation. Multivitamin  Hx CVA Continue aspirin and statin. Plavix on hold in the setting of SDH; but was not taking apparently at home anyway.  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  Restarted aspirin and statin.  Continue to hold Plavix.  Tobacco use disorder Discussed with patient need for complete cessation  Weakness/deconditioning/debility: Discharging to [place], SNF for further rehabilitation.  Severe protein calorie malnutrition Body mass index is 17.22 kg/m. Patient with notable muscle wasting/fat depletion as evidenced by weight loss. Nutrition Status: Nutrition Problem: Severe Malnutrition Etiology: chronic illness,poor appetite Signs/Symptoms: percent weight loss,moderate fat depletion,severe fat depletion,severe muscle depletion,per patient/family report Percent weight loss: 10.6 % Interventions: Tube feeding,Prostat,MVI --Dietitian following, appreciate assistance --Continue supplements, encourage increase oral intake  Discharge Diagnoses:  Active Problems:   COPD (chronic obstructive pulmonary disease) (HCC)   Alcohol use   History of CVA (cerebrovascular accident)   Benign essential HTN   Benign prostatic hyperplasia   Depression   ETOH abuse   Hyponatremia    Protein-calorie malnutrition, severe   Acute encephalopathy   SDH (subdural hematoma) ([place])    Discharge Instructions  Discharge Instructions    Diet - low sodium heart healthy   Complete by: As directed    Increase activity slowly   Complete by: As directed    No wound care   Complete by: As directed      Allergies as of 01/22/2021   No Known Allergies     Medication List    STOP taking these medications   clopidogrel 75 MG tablet Commonly known as: PLAVIX   cyanocobalamin 1000 MCG/ML injection Commonly known as: (VITAMIN B-12) Replaced by: cyanocobalamin 1000 MCG tablet   finasteride 5 MG tablet Commonly known as: PROSCAR   folic acid 1 MG tablet Commonly known as: FOLVITE   thiamine 100 MG tablet Commonly known as: Vitamin B-1     TAKE these medications   albuterol 108 (90 Base) MCG/ACT inhaler Commonly known as: VENTOLIN HFA Inhale 2 puffs into the lungs every 6 (six) hours as needed for wheezing or shortness of breath.   aspirin 81 MG chewable tablet Chew 1 tablet (81 mg total) by mouth daily.   cyanocobalamin 1000 MCG tablet Take 1 tablet (1,000 mcg total) by mouth daily. Replaces: cyanocobalamin 1000 MCG/ML injection   feeding supplement Liqd Take 237 mLs by mouth 3 (three) times daily between meals.   ferrous sulfate 325 (65 FE) MG tablet Take 1 tablet (325 mg total) by mouth daily with breakfast. Start taking on: Jan 23, 2021 What changed: when to take this   fluticasone furoate-vilanterol 200-25 MCG/INH Aepb Commonly known as: BREO ELLIPTA Inhale 1 puff into the lungs daily.   Incruse Ellipta 62.5 MCG/INH Aepb Generic drug: umeclidinium bromide INHALE 1 PUFF INTO THE LUNGS DAILY   levETIRAcetam 500 MG tablet Commonly known as: KEPPRA Take 1 tablet (500 mg total) by mouth 2 (two) times daily.   metoprolol tartrate 100 MG tablet Commonly known as: LOPRESSOR Take 1 tablet (100 mg total) by mouth 2 (two)  times daily. What changed:    medication strength  how much to take   multivitamin with minerals Tabs tablet Take 1 tablet by mouth daily.   pantoprazole 40 MG tablet Commonly known as: PROTONIX Take 1 tablet (40 mg total) by mouth daily.   rosuvastatin 10 MG tablet Commonly known as: CRESTOR Take 1 tablet (10 mg total) by mouth daily.       Contact information for follow-up providers    Lucianne Lei, MD. Schedule an appointment as soon as possible for a visit in 1 week(s).   Specialty: Family Medicine Contact information: [address] [place] [address] [phone]        Kary Kos, MD. Schedule an appointment as soon as possible for a visit in 1 week(s).   Specialty: Neurosurgery Why: SDH Contact information: [address] [place] [address] [phone]            Contact information for after-discharge care    Destination    [facility] .   Service: Skilled Nursing Contact information: [address] [place] [phone]                 No Known Allergies  Consultations:  PCCM  Neurosurgery  Neurology   Procedures/Studies: DG Chest 2 View  Result Date: 01/01/2021 CLINICAL DATA:  Altered mental status and possible sepsis EXAM: CHEST - 2 VIEW COMPARISON:  09/19/2020 FINDINGS: The heart size and mediastinal contours are within normal limits. Both lungs are clear. The visualized skeletal structures are unremarkable. IMPRESSION: No active cardiopulmonary disease. Electronically Signed   By: Inez Catalina M.D.   On: 01/01/2021 21:44   CT HEAD WO CONTRAST  Result Date: 01/19/2021 CLINICAL DATA:  Subdural hemorrhage. EXAM: CT HEAD WITHOUT CONTRAST TECHNIQUE: Contiguous axial images were obtained from the base of the skull through the vertex without intravenous contrast. COMPARISON:  Prior head CT examinations 01/13/2021 and earlier. FINDINGS: Brain: Stable cerebral and cerebellar atrophy. A  mixed density subdural hematoma overlying the left cerebral hemisphere has not significantly changed in size, again measuring up to 2.1 cm in greatest thickness (remeasured on prior). Hyperdense components have become less conspicuous as compared to the prior examination of 01/13/2021. Unchanged mass effect upon the underlying left cerebral hemisphere with partial effacement of the left lateral ventricle. Unchanged 8 mm rightward midline shift measured at the level of the septum pellucidum. As before, there is slight asymmetric effacement of the basal cisterns on the left. Previously demonstrated trace parafalcine subdural hemorrhage is no longer well appreciated. Stable chronic small vessel ischemic disease. 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 Unchanged 9 mm dural-based mass along the right aspect of the falx (overlying the medial right frontal lobe), likely reflecting an incidental meningioma. Vascular: No hyperdense vessel.  Atherosclerotic calcifications. Skull: Normal. Negative for fracture or focal lesion. Sinuses/Orbits: Visualized orbits show no acute finding. Trace scattered bilateral ethmoid sinus mucosal thickening. Partially imaged right maxillary sinus mucous retention cyst. IMPRESSION: Unchanged size of a mixed density subdural hematoma overlying the left cerebral hemisphere as compared to the head CT of 01/13/2021. Hyperdense components within the collection have decreased. Unchanged mass effect upon the underlying left cerebral hemisphere with partial effacement of the left lateral ventricle. Unchanged 8 mm rightward midline shift. As before, there is mild asymmetric effacement of the basal cisterns on the left. Previously demonstrated trace parafalcine subdural hemorrhage is no longer appreciated. Stable parenchymal atrophy and  chronic small vessel ischemic disease. 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 Redemonstrated 9  mm right parafalcine meningioma. Electronically Signed   By: Kellie Simmering DO   On: 01/19/2021 15:25   CT HEAD WO CONTRAST  Addendum Date: 01/13/2021   ADDENDUM REPORT: 01/13/2021 14:48 ADDENDUM: Impressions #1 and #2 called by telephone at the time of interpretation on 01/13/2021 at 2:44 pm to provider [facility] , who verbally acknowledged these results. Electronically Signed   By: Kellie Simmering DO   On: 01/13/2021 14:48   Result Date: 01/13/2021 CLINICAL DATA:  Mental status change, unknown cause. EXAM: CT HEAD WITHOUT CONTRAST TECHNIQUE: Contiguous axial images were obtained from the base of the skull through the vertex without intravenous contrast. COMPARISON:  Prior head CT examinations 01/06/2021 and earlier. FINDINGS: Brain: Mild-to-moderate cerebral and cerebellar atrophy. Continued interval increase in size of a mixed density subdural hematoma overlying the left cerebral hemisphere as compared to the head CT of 01/06/2021. The hematoma now measures up to 14 mm in greatest thickness (previously 10 mm). Increased mass effect upon the left cerebral hemisphere with increased partial effacement of the left lateral ventricle. Increased midline shift measured at the level of the septum pellucidum, now 8 mm (previously 4 mm). There is slight asymmetric effacement of the basal cisterns on the left (series 3, image 14). Stable trace parafalcine subdural hemorrhage. Stable chronic small vessel ischemic disease within the cerebral white matter. 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 Redemonstrated 9 mm dural-based mass along the right aspect of the falx, overlying the medial right frontal lobe, likely reflecting an incidental meningioma (series 5, image 35). Vascular: These form dilation of the distal cervical left ICA without sclerotic calcification at this site. The visualized distal cervical left ICA measures up to 15 mm in diameter. This appears to have been present on  prior examinations dating back this may reflect a fusiform aneurysm. No hyperdense vessel. Atherosclerotic calcifications at the base of the brain. Skull: Normal. Negative for fracture or focal lesion. Sinuses/Orbits: Visualized orbits show no acute finding. Mild bilateral frontal and ethmoid sinus mucosal thickening. Moderate-sized mucous retention cyst versus polypoid mucosal thickening within the right maxillary sinus IMPRESSION: Interval increase in size of a mixed density subdural hematoma overlying the left cerebral hemisphere as compared to 01/06/2021. The hematoma now measures 14 mm in greatest thickness (previously 10 mm). Increased mass effect upon the left cerebral hemisphere with increased partial effacement of the left lateral ventricle. Increased midline shift, now 8 mm (previously 4 mm). There is now mild asymmetric effacement of the basal cisterns on the left. Unchanged trace parafalcine subdural hemorrhage. Stable background cerebral white matter chronic small vessel ischemic disease. Redemonstrated chronic infarcts within bilateral cerebellar hemispheres. 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 Fusiform dilation of the partially imaged distal cervical left ICA, measuring up to 15 mm in diameter. 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 Consider non-emergent MR or CT angiography for further evaluation. Electronically Signed: By: Kellie Simmering DO On: 01/13/2021 14:17   CT HEAD WO CONTRAST  Result Date: 01/06/2021 CLINICAL DATA:  Followup subdural hematoma EXAM: CT HEAD WITHOUT CONTRAST TECHNIQUE: Contiguous axial images were obtained from the base of the skull through the vertex without intravenous contrast. COMPARISON:  01/05/2021 FINDINGS: Brain: Left lateral convexity subdural hematoma is stable, maximal thickness 10 mm measured on the coronal imaging. No additional bleeding or mass-effect. Left-to-right shift of 4 mm. Tiny  amount of  subdural blood along the falx is unchanged. No new area of bleeding. No acute ischemic infarction. Old small vessel infarctions of the cerebellum and chronic small-vessel ischemic changes of the cerebral hemispheric white matter. Vascular: No acute vascular finding. Skull: Negative Sinuses/Orbits: Clear/normal Other: None IMPRESSION: No change since yesterday. Left convexity subdural is no larger. Mass-effect is the same. Small amount of blood along the falx is the same. Electronically Signed   By: Nelson Chimes M.D.   On: 01/06/2021 13:54   CT HEAD WO CONTRAST  Result Date: 01/05/2021 CLINICAL DATA:  Follow-up subdural hematoma. EXAM: CT HEAD WITHOUT CONTRAST TECHNIQUE: Contiguous axial images were obtained from the base of the skull through the vertex without intravenous contrast. COMPARISON:  Earlier today, additional priors. FINDINGS: Brain: Acute left subdural hematoma is essentially unchanged in size from earlier today, maximal dimension 12 mm in the parietal region, series 3, image 11, 11 mm earlier today, and 14 mm yesterday. There is trace subdural blood along the superior falx, unchanged from earlier today. Minimal mass effect on the underlying high convexity. Left-to-right midline shift of 5 mm, previously 6 mm. No intraparenchymal or subarachnoid hemorrhage. No evidence of developing ischemia. Remote bilateral cerebellar infarcts. Vascular: Atherosclerosis of skullbase vasculature without hyperdense vessel or abnormal calcification. Skull: No fracture or focal lesion. Sinuses/Orbits: No acute findings. Other: None. IMPRESSION: 1. Acute left subdural hematoma is essentially unchanged in size from earlier today, maximal dimension 12 mm in the parietal region. Left-to-right midline shift of 5 mm, previously 6 mm. 2. Small amount of subdural blood tracks along the superior falx, unchanged. Electronically Signed   By: Keith Rake M.D.   On: 01/05/2021 18:30   CT HEAD WO CONTRAST  Result Date:  01/05/2021 CLINICAL DATA:  Recent subdural hematoma EXAM: CT HEAD WITHOUT CONTRAST TECHNIQUE: Contiguous axial images were obtained from the base of the skull through the vertex without intravenous contrast. COMPARISON:  Jan 04, 2021 FINDINGS: Brain: The previously noted acute subdural hematoma along the left frontal and temporal lobes extending superiorly to the level of the left convexity is again noted with a maximum thickness of 1.1 cm in the left convexity region, marginally less thick than on the previous study. Subdural size and contour elsewhere appears essentially stable. There is stable mass effect on portions of the left frontal and temporal lobes with 6 mm of midline shift to the right, stable. No new or enlarging subdural hematoma evident. The slight amount of subdural hematoma in the anterior falx is stable without mass effect. There is no evident intra-axial mass or hemorrhage. Mild diffuse atrophy with mild underlying periventricular small vessel disease noted. Prior cerebellar infarcts are stable, larger on the right than on the left. Note that fourth ventricle is midline. No acute infarct evident. Vascular: No evident hyperdense vessel. Calcification noted in each carotid siphon region. Skull: Bony calvarium appears intact. Sinuses/Orbits: Stable opacification in several ethmoid air cells and superior right maxillary antrum, stable. Orbits appear symmetric bilaterally. Other: Mastoid air cells clear IMPRESSION: Left-sided subdural hematoma again noted, minimally smaller in the region of the convexity and stable elsewhere. Degree of mass effect stable with 6 mm of midline shift toward the right at the level of the foramen of Monro. Fourth ventricle midline. Stable atrophy with periventricular small vessel disease. Prior cerebellar infarcts appear stable. No acute infarct. No intra-axial mass or hemorrhage. There are foci of arterial vascular calcification. There are foci of paranasal sinus disease.  Electronically Signed   By:  Lowella Grip III M.D.   On: 01/05/2021 12:29   CT HEAD WO CONTRAST  Result Date: 01/04/2021 CLINICAL DATA:  Subdural hematoma EXAM: CT HEAD WITHOUT CONTRAST TECHNIQUE: Contiguous axial images were obtained from the base of the skull through the vertex without intravenous contrast. COMPARISON:  Jan 03, 2021 FINDINGS: Brain: There is an acute appearing subdural hematoma again noted along the left frontal and temporal lobes extending superiorly to the level of the left convexity. The maximum thickness of this subdural hematoma in the left convexity region is 1.4 cm, stable by remeasurement. There is stable mass effect on portions of the left frontal, temporal, anterior parietal lobes. At the level of the foramen of Monro, there is 6 mm of midline shift toward the right, slightly increased compared to 1 day prior. There is subdural hematoma tracking along the falx with slightly more hemorrhage in this area compared to previous study. There is no appreciable mass effect along the falx, however. There is no intra-axial hemorrhage or mass. There is underlying mild diffuse atrophy with mild periventricular small vessel disease. There are small prior infarcts in the cerebellum bilaterally. No acute infarct is appreciable. Vascular: No hyperdense vessel. There is calcification in each carotid siphon region. Skull: The bony calvarium appears intact. Sinuses/Orbits: There is opacification in several ethmoid air cells as well as in the anterior superior right maxillary antrum. Visualized orbits appear symmetric bilaterally. Other: Mastoid air cells are clear. IMPRESSION: Subdural hematoma on the left with essentially stable size and contour compared to the previous study. There remains mass effect on portions of the left frontal, left temporal, anterior left parietal lobes. There is marginally more midline shift to the right compared to 1 day prior. There is slightly more acute subdural  hematoma in the falx without appreciable mass effect in the falcine region. There is no appreciable intra-axial hemorrhage. There is mild atrophy with mild periventricular small vessel disease. Small chronic appearing infarcts in the cerebellum, stable. No evident acute infarct. Foci of arterial vascular calcification noted. Areas of paranasal sinus disease noted. Electronically Signed   By: Lowella Grip III M.D.   On: 01/04/2021 08:01   CT HEAD WO CONTRAST  Result Date: 01/03/2021 CLINICAL DATA:  Delirium EXAM: CT HEAD WITHOUT CONTRAST TECHNIQUE: Contiguous axial images were obtained from the base of the skull through the vertex without intravenous contrast. COMPARISON:  09/14/2020 FINDINGS: Brain: Hyperdense subdural hemorrhage is present along the left cerebral convexity measuring up to 9 mm thickness. Small volume hyperdense subdural hemorrhage is also present along the falx. Mass effect is mild with 4 mm of rightward midline shift at the foramen of Monro. 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 Gray-white differentiation is preserved. Prominence of the ventricles and sulci reflects generalized parenchymal volume loss. Chronic bilateral cerebellar infarcts. Patchy hypoattenuation in the supratentorial white matter is nonspecific but probably reflects stable chronic microvascular ischemic changes. Vascular: There is intracranial atherosclerotic calcification at the skull base Skull: Unremarkable. Sinuses/Orbits: Patchy paranasal sinus mucosal thickening. No acute orbital abnormality. Other: Mastoid air cells are clear. IMPRESSION: Acute left cerebral convexity subdural hemorrhage. Minimal additional subdural hemorrhage along the falx. Mass effect is mild with minor rightward midline shift. Stable chronic findings detailed above. These results were called by telephone at the time of interpretation on 01/03/2021 at 11:40 am to provider [facility] , who verbally acknowledged  these results. Electronically Signed   By: Macy Mis M.D.   On: 01/03/2021 11:44  DG CHEST PORT 1 VIEW  Result Date: 01/13/2021 CLINICAL DATA:  Shortness of breath. EXAM: PORTABLE CHEST 1 VIEW COMPARISON:  01/10/2021.  01/08/2021. FINDINGS: Mediastinum and hilar structures normal. Low lung volumes. Mild right base recurrent atelectatic changes again noted. No prominent pleural effusion. No pneumothorax. Prior cervical spine fusion. IMPRESSION: Mild right base recurrent atelectatic changes again noted. Electronically Signed   By: Marcello Moores  Register   On: 01/13/2021 06:03   DG Chest Port 1 View  Result Date: 01/10/2021 CLINICAL DATA:  Shortness of breath EXAM: PORTABLE CHEST 1 VIEW COMPARISON:  Radiograph 01/08/2021, chest CT 02/19/2019 FINDINGS: Unchanged cardiomediastinal silhouette. Decreased opacities in the right lung base. There is no new focal airspace disease. There is no pleural effusion or pneumothorax. Osseous structures are unchanged. Partially visualized cervical spine fusion hardware. IMPRESSION: Decreased opacities in the right lung base, likely improved atelectasis. No new focal airspace disease. Electronically Signed   By: Maurine Simmering   On: 01/10/2021 08:22   DG Chest Port 1 View  Result Date: 01/08/2021 CLINICAL DATA:  Shob/ Altered mental status Evaluate edema. pleural effusion. EXAM: PORTABLE CHEST - 1 VIEW COMPARISON:  01/06/2021 FINDINGS: Interstitial opacities at the right lung base are more conspicuous than on prior study. Left lung remains clear. Heart size and mediastinal contours are within normal limits. No effusion.  No pneumothorax. Cervical fixation hardware partially visualized. IMPRESSION: 1. Developing infiltrate or atelectasis at the right lung base. Electronically Signed   By: Lucrezia Europe M.D.   On: 01/08/2021 10:49   DG Chest Port 1 View  Result Date: 01/06/2021 CLINICAL DATA:  Shortness of breath, altered mental status EXAM: PORTABLE CHEST 1 VIEW COMPARISON:   01/05/2021 FINDINGS: Mild elevation of the right hemidiaphragm. No consolidation or edema. No significant pleural effusion. Stable blunting of the right costophrenic angle. Stable cardiomediastinal contours. IMPRESSION: No acute process in the chest. Electronically Signed   By: Macy Mis M.D.   On: 01/06/2021 08:25   DG Chest Port 1 View  Result Date: 01/05/2021 CLINICAL DATA:  Shortness of breath EXAM: PORTABLE CHEST 1 VIEW COMPARISON:  01/04/2021 FINDINGS: Heart and mediastinal contours are within normal limits. No focal opacities or effusions. No acute bony abnormality. IMPRESSION: No active disease. Electronically Signed   By: Rolm Baptise M.D.   On: 01/05/2021 08:28   DG CHEST PORT 1 VIEW  Result Date: 01/04/2021 CLINICAL DATA:  Cough and fevers EXAM: PORTABLE CHEST 1 VIEW COMPARISON:  01/01/2021, 09/19/2020 FINDINGS: Cardiac shadow is within normal limits. The lungs are well aerated bilaterally. Chronic blunting of the right costophrenic angle is noted stable from prior exams. No focal infiltrate is seen. No bony abnormality is noted. IMPRESSION: No active disease. Electronically Signed   By: Inez Catalina M.D.   On: 01/04/2021 22:35   DG Abd Portable 1V  Result Date: 01/14/2021 CLINICAL DATA:  Cortrak placement EXAM: PORTABLE ABDOMEN - 1 VIEW COMPARISON:  None. FINDINGS: Air-filled small and large bowel loops seen within the upper abdomen. The right hemidiaphragm is elevated. Nasogastric tube terminates in the region of the gastric body. IMPRESSION: Nasogastric tube terminates in the region of the gastric body. Electronically Signed   By: Miachel Roux M.D.   On: 01/14/2021 13:27   EEG adult  Result Date: 01/06/2021 Lora Havens, MD     01/06/2021  3:09 PM Patient Name: REUBIN BUSHNELL MRN: [id number] Epilepsy Attending: Lora Havens Referring Physician/Provider: Dr [name] Date: 01/06/2021 Duration: 23.31 mins Patient history: 65 year old male with  alcohol use who presented  with altered mental status and was found to have left subdural hematoma which did not require any neurosurgical intervention. Had generalized tonic-clonic seizure-like episode this afternoon. EEG to evaluate for seizure Level of alertness: lethargic AEDs during EEG study: Ativan Technical aspects: This EEG study was done with scalp electrodes positioned according to the 10-20 International system of electrode placement. Electrical activity was acquired at a sampling rate of 500Hz  and reviewed with a high frequency filter of 70Hz  and a low frequency filter of 1Hz . EEG data were recorded continuously and digitally stored. Description: EEG showed continuous generalized low amplitude predominantly 2-3 Hz admixed with intermittent 5- 6 Hz theta slowing.   Hyperventilation and photic stimulation were not performed.   ABNORMALITY - Continuous slow, generalized IMPRESSION: This study is suggestive of moderate to severe diffuse encephalopathy, nonspecific etiology. No seizures or epileptiform discharges were seen throughout the recording. Priyanka [name]   Overnight EEG with video  Result Date: 01/07/2021 Lora Havens, MD     01/07/2021  1:04 PM Patient Name: XAI FRERKING MRN: [id number] Epilepsy Attending: Lora Havens Referring Physician/Provider: Dr Zeb Comfort Duration: 01/06/2021 1447 to 01/07/2021 1136  Patient history: 65 year old male with alcohol use who presented with altered mental status and was found to have left subdural hematoma which did not require any neurosurgical intervention. Had generalized tonic-clonic seizure-like episode this afternoon. EEG to evaluate for seizure  Level of alertness: lethargic  AEDs during EEG study: Ativan, LEV  Technical aspects: This EEG study was done with scalp electrodes positioned according to the 10-20 International system of electrode placement. Electrical activity was acquired at a sampling rate of 500Hz  and reviewed with a high frequency filter of 70Hz   and a low frequency filter of 1Hz . EEG data were recorded continuously and digitally stored.  Description: The posterior dominant rhythm consists of 8 Hz activity of moderate voltage (25-35 uV) seen predominantly in posterior head regions, symmetric and reactive to eye opening and eye closing. Sleep was characterized by vertex waves, sleep spindles (12 to 14 Hz), maximal frontocentral region. EEG initially showed continuous generalized low amplitude predominantly 2-3 Hz admixed with intermittent 5- 6 Hz theta slowing which gradually improved to intermittent 3-5hz  theta-delta slowing. Sharp transients were seen in left fronto-centro-temporal region. Hyperventilation and photic stimulation were not performed.    ABNORMALITY - Continuous slow, generalized - Intermittent slow, generalized  IMPRESSION: This study was initially suggestive of moderate diffuse encephalopathy which gradually improved to mild diffuse encephalopathy, nonspecific etiology but likely related to postictal state. No seizures or definite epileptiform discharges were seen throughout the recording.  Lora Havens   ECHOCARDIOGRAM COMPLETE  Result Date: 01/07/2021    ECHOCARDIOGRAM REPORT   Patient Name:   MATTIAS WALMSLEY Date of Exam: 01/07/2021 Medical Rec #:  [id number]       Height:       75.0 in Accession #:    [id number]      Weight:       138.0 lb Date of Birth:  1956/01/21        BSA:          1.874 m Patient Age:    70 years        BP:           156/99 mmHg Patient Gender: M               HR:           96 bpm.  Exam Location:  Inpatient Procedure: 2D Echo, Cardiac Doppler and Color Doppler Indications:    Cardiac arrest  History:        Patient has prior history of Echocardiogram examinations, most                 recent 09/20/2019. COPD; Risk Factors:Hypertension.  Sonographer:    Cammy Brochure Referring Phys: [phone] [facility] DAVIS  Sonographer Comments: Image acquisition challenging due to uncooperative patient and Image  acquisition challenging due to respiratory motion. IMPRESSIONS  1. Left ventricular ejection fraction, by estimation, is 60 to 65%. The left ventricle has normal function. The left ventricle has no regional wall motion abnormalities. There is mild left ventricular hypertrophy. Left ventricular diastolic parameters are consistent with Grade I diastolic dysfunction (impaired relaxation).  2. Right ventricular systolic function is normal. The right ventricular size is normal.  3. Left atrial size was mild to moderately dilated.  4. The mitral valve is normal in structure. Trivial mitral valve regurgitation. No evidence of mitral stenosis.  5. The aortic valve was not well visualized. There is mild calcification of the aortic valve. Aortic valve regurgitation is not visualized. Mild aortic valve sclerosis is present, with no evidence of aortic valve stenosis.  6. The inferior vena cava is normal in size with greater than 50% respiratory variability, suggesting right atrial pressure of 3 mmHg. FINDINGS  Left Ventricle: Left ventricular ejection fraction, by estimation, is 60 to 65%. The left ventricle has normal function. The left ventricle has no regional wall motion abnormalities. The left ventricular internal cavity size was normal in size. There is  mild left ventricular hypertrophy. Left ventricular diastolic parameters are consistent with Grade I diastolic dysfunction (impaired relaxation). Right Ventricle: The right ventricular size is normal. No increase in right ventricular wall thickness. Right ventricular systolic function is normal. Left Atrium: Left atrial size was mild to moderately dilated. Right Atrium: Right atrial size was normal in size. Pericardium: There is no evidence of pericardial effusion. Mitral Valve: The mitral valve is normal in structure. Trivial mitral valve regurgitation. No evidence of mitral valve stenosis. Tricuspid Valve: The tricuspid valve is normal in structure. Tricuspid valve  regurgitation is mild . No evidence of tricuspid stenosis. Aortic Valve: The aortic valve was not well visualized. There is mild calcification of the aortic valve. Aortic valve regurgitation is not visualized. Mild aortic valve sclerosis is present, with no evidence of aortic valve stenosis. Aortic valve mean gradient measures 3.0 mmHg. Aortic valve peak gradient measures 4.9 mmHg. Aortic valve area, by VTI measures 3.14 cm. Pulmonic Valve: The pulmonic valve was not well visualized. Pulmonic valve regurgitation is not visualized. No evidence of pulmonic stenosis. Aorta: The aortic root is normal in size and structure. Venous: The inferior vena cava is normal in size with greater than 50% respiratory variability, suggesting right atrial pressure of 3 mmHg. IAS/Shunts: No atrial level shunt detected by color flow Doppler.  LEFT VENTRICLE PLAX 2D LVIDd:         3.30 cm  Diastology LVIDs:         2.20 cm  LV e' medial:    5.66 cm/s LV PW:         1.30 cm  LV E/e' medial:  8.7 LV IVS:        1.30 cm  LV e' lateral:   9.14 cm/s LVOT diam:     2.00 cm  LV E/e' lateral: 5.4 LV SV:  26 LV SV Index:   32 LVOT Area:     3.14 cm  RIGHT VENTRICLE RV S prime:     12.80 cm/s TAPSE (M-mode): 2.5 cm LEFT ATRIUM             Index LA diam:        2.50 cm 1.33 cm/m LA Vol (A2C):   50.8 ml 27.10 ml/m LA Vol (A4C):   49.0 ml 26.12 ml/m LA Biplane Vol: 49.6 ml 26.46 ml/m  AORTIC VALVE AV Area (Vmax):    2.69 cm AV Area (Vmean):   2.52 cm AV Area (VTI):     3.14 cm AV Vmax:           111.00 cm/s AV Vmean:          89.000 cm/s AV VTI:            0.188 m AV Peak Grad:      4.9 mmHg AV Mean Grad:      3.0 mmHg LVOT Vmax:         95.00 cm/s LVOT Vmean:        71.300 cm/s LVOT VTI:          0.188 m LVOT/AV VTI ratio: 1.00  AORTA Ao Root diam: 3.90 cm MITRAL VALVE               TRICUSPID VALVE MV Area (PHT): 5.02 cm    TR Peak grad:   21.0 mmHg MV Decel Time: 151 msec    TR Vmax:        229.00 cm/s MV E velocity: 49.20 cm/s MV  A velocity: 70.60 cm/s  SHUNTS MV E/A ratio:  0.70        Systemic VTI:  0.19 m                            Systemic Diam: 2.00 cm Glori Bickers MD Electronically signed by Glori Bickers MD Signature Date/Time: 01/07/2021/2:58:36 PM    Final       Subjective: Patient seen and examined at bedside, resting comfortably.  Sitting in bedside chair.  Eating breakfast.  No complaints this morning.  Continues with intermittent confusion.  Denies headache, no fever/chills/night sweats, no nausea/vomiting/diarrhea, no chest pain, no palpitations, no shortness of breath, no abdominal pain.  No acute events overnight per nursing staff.  Discharging to SNF today.  Discharge Exam: Vitals:   01/22/21 0531 01/22/21 0748  BP: (!) 148/65   Pulse: 75 88  Resp: 18 16  Temp: 98 F (36.7 C)   SpO2: 98% 100%   Vitals:   01/21/21 1952 01/22/21 0234 01/22/21 0531 01/22/21 0748  BP: (!) 153/74  (!) 148/65   Pulse: 89  75 88  Resp: 18  18 16   Temp: 98.9 F (37.2 C)  98 F (36.7 C)   TempSrc: Oral  Oral   SpO2: 100%  98% 100%  Weight:  62.5 kg    Height:        General: Pt is alert, awake, not in acute distress, chronically ill in appearance, thin/cachectic, pleasantly confused Cardiovascular: RRR, S1/S2 +, no rubs, no gallops Respiratory: CTA bilaterally, no wheezing, no rhonchi, on room air Abdominal: Soft, NT, ND, bowel sounds + Extremities: no edema, no cyanosis    The results of significant diagnostics from this hospitalization (including imaging, microbiology, ancillary and laboratory) are listed below for reference.     Microbiology: Recent Results (from the  past 240 hour(s))  Culture, blood (routine x 2)     Status: None   Collection Time: 01/12/21  6:01 PM   Specimen: BLOOD  Result Value Ref Range Status   Specimen Description BLOOD SITE NOT SPECIFIED  Final   Special Requests   Final    BOTTLES DRAWN AEROBIC ONLY Blood Culture results may not be optimal due to an inadequate  volume of blood received in culture bottles   Culture   Final    NO GROWTH 5 DAYS Performed at [facility], [place] [address], [place], [place] [address]    Report Status 01/17/2021 FINAL  Final  Culture, blood (routine x 2)     Status: None   Collection Time: 01/12/21  7:13 PM   Specimen: BLOOD RIGHT ARM  Result Value Ref Range Status   Specimen Description BLOOD RIGHT ARM  Final   Special Requests   Final    BOTTLES DRAWN AEROBIC AND ANAEROBIC Blood Culture results may not be optimal due to an inadequate volume of blood received in culture bottles   Culture   Final    NO GROWTH 5 DAYS Performed at [facility], [place] [address] [place], [place] [address]    Report Status 01/17/2021 FINAL  Final  Culture, Urine     Status: Abnormal   Collection Time: 01/13/21  9:20 AM   Specimen: Urine, Random  Result Value Ref Range Status   Specimen Description URINE, RANDOM  Final   Special Requests   Final    NONE Performed at [facility], [place] [address] [place], [place] [address]    Culture MULTIPLE SPECIES PRESENT, SUGGEST RECOLLECTION (A)  Final   Report Status 01/15/2021 FINAL  Final  MRSA PCR Screening     Status: None   Collection Time: 01/13/21  4:13 PM   Specimen: Nasopharyngeal  Result Value Ref Range Status   MRSA by PCR NEGATIVE NEGATIVE Final    Comment:        The GeneXpert MRSA Assay (FDA approved for NASAL specimens only), is one component of a comprehensive MRSA colonization surveillance program. It is not intended to diagnose MRSA infection nor to guide or monitor treatment for MRSA infections. Performed at [facility], [place] [address] [place], [place] [address]   SARS CORONAVIRUS 2 (TAT 6-24 HRS) Nasopharyngeal Nasopharyngeal Swab     Status: None   Collection Time: 01/21/21  1:57 PM   Specimen: Nasopharyngeal Swab  Result Value Ref Range Status   SARS Coronavirus 2 NEGATIVE NEGATIVE Final    Comment: (NOTE) SARS-CoV-2 target nucleic  acids are NOT DETECTED.  The SARS-CoV-2 RNA is generally detectable in upper and lower respiratory specimens during the acute phase of infection. Negative results do not preclude SARS-CoV-2 infection, do not rule out co-infections with other pathogens, and should not be used as the sole basis for treatment or other patient management decisions. Negative results must be combined with clinical observations, patient history, and epidemiological information. The expected result is Negative.  Fact Sheet for Patients: SugarRoll.be  Fact Sheet for Healthcare Providers: https://www.woods-mathews.com/  This test is not yet approved or cleared by the [place] FDA and  has been authorized for detection and/or diagnosis of SARS-CoV-2 by FDA under an Emergency Use Authorization (EUA). This EUA will remain  in effect (meaning this test can be used) for the duration of the COVID-19 declaration under Se ction 564(b)(1) of the Act, 21 U.S.C. section 360bbb-3(b)(1), unless  the authorization is terminated or revoked sooner.  Performed at [facility], [place] [address] [place], [place] [address]      Labs: BNP (last 3 results) Recent Labs    01/12/21 0043 01/13/21 0143 01/14/21 0914  BNP 204.4* 193.7* 28.7   Basic Metabolic Panel: Recent Labs  Lab 01/16/21 0040 01/17/21 0239 01/18/21 0226 01/19/21 0241 01/20/21 0123 01/21/21 0050  NA 134* 134* 134* 135 131* 133*  K 4.2 4.4 4.9 4.5 4.2 4.8  CL 101 103 102 103 99 100  CO2 23 22 22 25  21* 22  GLUCOSE 127* 103* 84 94 83 86  BUN 12 13 20 19 20  25*  CREATININE 0.83 0.80 0.85 0.86 0.92 0.99  CALCIUM 9.2 9.0 9.4 9.7 9.5 9.2  MG 2.0  --   --   --   --   --    Liver Function Tests: No results for input(s): AST, ALT, ALKPHOS, BILITOT, PROT, ALBUMIN in the last 168 hours. No results for input(s): LIPASE, AMYLASE in the last 168 hours. No results for input(s): AMMONIA in the last 168  hours. CBC: Recent Labs  Lab 01/17/21 0239 01/18/21 0226 01/19/21 0241 01/20/21 0123 01/21/21 0050  WBC 16.3* 18.6* 20.0* 19.3* 16.8*  HGB 8.1* 8.5* 8.6* 9.8* 8.3*  HCT 25.2* 26.2* 27.3* 30.3* 27.6*  MCV 99.2 98.5 99.6 98.7 104.2*  PLT 1,123* 1,073* 1,131* 1,143* 982*   Cardiac Enzymes: No results for input(s): CKTOTAL, CKMB, CKMBINDEX, TROPONINI in the last 168 hours. BNP: Invalid input(s): POCBNP CBG: Recent Labs  Lab 01/21/21 0736 01/21/21 1138 01/21/21 1723 01/21/21 2226 01/22/21 0746  GLUCAP 83 94 114* 128* 87   D-Dimer No results for input(s): DDIMER in the last 72 hours. Hgb A1c No results for input(s): HGBA1C in the last 72 hours. Lipid Profile No results for input(s): CHOL, HDL, LDLCALC, TRIG, CHOLHDL, LDLDIRECT in the last 72 hours. Thyroid function studies No results for input(s): TSH, T4TOTAL, T3FREE, THYROIDAB in the last 72 hours.  Invalid input(s): FREET3 Anemia work up No results for input(s): VITAMINB12, FOLATE, FERRITIN, TIBC, IRON, RETICCTPCT in the last 72 hours. Urinalysis    Component Value Date/Time   COLORURINE YELLOW 01/13/2021 [place] 01/13/2021 0949   LABSPEC 1.006 01/13/2021 0949   PHURINE 7.0 01/13/2021 0949   GLUCOSEU NEGATIVE 01/13/2021 0949   HGBUR NEGATIVE 01/13/2021 0949   BILIRUBINUR NEGATIVE 01/13/2021 0949   KETONESUR NEGATIVE 01/13/2021 0949   PROTEINUR NEGATIVE 01/13/2021 0949   NITRITE NEGATIVE 01/13/2021 0949   LEUKOCYTESUR NEGATIVE 01/13/2021 0949   Sepsis Labs Invalid input(s): PROCALCITONIN,  WBC,  LACTICIDVEN Microbiology Recent Results (from the past 240 hour(s))  Culture, blood (routine x 2)     Status: None   Collection Time: 01/12/21  6:01 PM   Specimen: BLOOD  Result Value Ref Range Status   Specimen Description BLOOD SITE NOT SPECIFIED  Final   Special Requests   Final    BOTTLES DRAWN AEROBIC ONLY Blood Culture results may not be optimal due to an inadequate volume of blood received  in culture bottles   Culture   Final    NO GROWTH 5 DAYS Performed at [facility], [place] [address] [place], [place] [address]    Report Status 01/17/2021 FINAL  Final  Culture, blood (routine x 2)     Status: None   Collection Time: 01/12/21  7:13 PM   Specimen: BLOOD RIGHT ARM  Result Value Ref Range Status   Specimen  Description BLOOD RIGHT ARM  Final   Special Requests   Final    BOTTLES DRAWN AEROBIC AND ANAEROBIC Blood Culture results may not be optimal due to an inadequate volume of blood received in culture bottles   Culture   Final    NO GROWTH 5 DAYS Performed at [facility], [place] [address] [place], [place] [address]    Report Status 01/17/2021 FINAL  Final  Culture, Urine     Status: Abnormal   Collection Time: 01/13/21  9:20 AM   Specimen: Urine, Random  Result Value Ref Range Status   Specimen Description URINE, RANDOM  Final   Special Requests   Final    NONE Performed at [facility], [place] [address] [place], [place] [address]    Culture MULTIPLE SPECIES PRESENT, SUGGEST RECOLLECTION (A)  Final   Report Status 01/15/2021 FINAL  Final  MRSA PCR Screening     Status: None   Collection Time: 01/13/21  4:13 PM   Specimen: Nasopharyngeal  Result Value Ref Range Status   MRSA by PCR NEGATIVE NEGATIVE Final    Comment:        The GeneXpert MRSA Assay (FDA approved for NASAL specimens only), is one component of a comprehensive MRSA colonization surveillance program. It is not intended to diagnose MRSA infection nor to guide or monitor treatment for MRSA infections. Performed at [facility], [place] [address], [place], [place] [address]   SARS CORONAVIRUS 2 (TAT 6-24 HRS) Nasopharyngeal Nasopharyngeal Swab     Status: None   Collection Time: 01/21/21  1:57 PM   Specimen: Nasopharyngeal Swab  Result Value Ref Range Status   SARS Coronavirus 2 NEGATIVE NEGATIVE Final    Comment: (NOTE) SARS-CoV-2 target nucleic acids are NOT  DETECTED.  The SARS-CoV-2 RNA is generally detectable in upper and lower respiratory specimens during the acute phase of infection. Negative results do not preclude SARS-CoV-2 infection, do not rule out co-infections with other pathogens, and should not be used as the sole basis for treatment or other patient management decisions. Negative results must be combined with clinical observations, patient history, and epidemiological information. The expected result is Negative.  Fact Sheet for Patients: SugarRoll.be  Fact Sheet for Healthcare Providers: https://www.woods-mathews.com/  This test is not yet approved or cleared by the [place] FDA and  has been authorized for detection and/or diagnosis of SARS-CoV-2 by FDA under an Emergency Use Authorization (EUA). This EUA will remain  in effect (meaning this test can be used) for the duration of the COVID-19 declaration under Se ction 564(b)(1) of the Act, 21 U.S.C. section 360bbb-3(b)(1), unless the authorization is terminated or revoked sooner.  Performed at [facility], [place] [address], [place], [place] [address]      Time coordinating discharge: Over 30 minutes  SIGNED:   Donnamarie Poag [place] DO  [facility] 01/22/2021, 11:17 AM

## 2021-01-22 NOTE — Progress Notes (Signed)
[name] to be D/C'd  per MD order. Discussed with the patient and all questions fully answered.  VSS, Skin clean, dry and intact without evidence of skin break down, no evidence of skin tears noted.  IV catheter discontinued intact. Site without signs and symptoms of complications. Dressing and pressure applied.  An After Visit Summary was printed and sent to [facility].  [facility] to transport patient to [facility] via Biomedical scientist.

## 2021-01-22 NOTE — TOC Progression Note (Signed)
Transition of Care [facility]) - Progression Note    Patient Details  Name: Seth [name] MRN: [id number] Date of Birth: 1956/02/21  Transition of Care [facility]) CM/SW Contact  Doc Mandala, [place], [place] Phone [phone] 01/22/2021, 9:52 AM  Clinical Narrative:     Follow up phone call to [place] to discuss patient's discharge to this facility today. Voicemail message left with admissions [phone].   [place], LCSW Transitions of Care [phone]      Expected Discharge Plan: Skilled Nursing Facility Barriers to Discharge: Insurance Authorization  Expected Discharge Plan and Services Expected Discharge Plan: [place] In-house Referral: Clinical Social Work   Post Acute Care Choice: [place] Living arrangements for the past 2 months: [place] Determinants of Health (SDOH) Interventions    Readmission Risk Interventions Readmission Risk Prevention Plan 06/04/2019  Transportation Screening Complete  PCP or Specialist Appt within 5-7 Days Complete  Home Care Screening Complete  Medication Review (RN CM) Complete  Some recent data might be hidden

## 2021-01-22 NOTE — TOC Transition Note (Signed)
Transition of Care [facility]) - CM/SW Discharge Note   Patient Details  Name: Seth [name] MRN: [id number] Date of Birth: 1955/12/02  Transition of Care [facility] CM/SW Contact:  Elliot Gurney [place],  Phone Number: [phone] 01/22/2021, 11:39 AM   Clinical Narrative:    Return call from [place] at [facility]. Patient able to discharge today and will be going in to room 126 B. Please call report to [phone]. Patient to be transported by [facility]. Covid vaccination card in patient's wallet. Patient's daughter [name] agreed to bring patient's vaccination card. Patient informed of plan to transition to [place].   [place], LCSW Transition of Care [phone]    Final next level of care: Skilled Nursing Facility Barriers to Discharge: Insurance Authorization   Patient Goals and CMS Choice Patient states their goals for this hospitalization and ongoing recovery are:: Rehab CMS Medicare.gov Compare Post Acute Care list provided to:: Patient Choice offered to / list presented to : Patient  Discharge Placement                       Discharge Plan and Services In-house Referral: Clinical Social Work   Post Acute Care Choice: [place]                               Social Determinants of Health (SDOH) Interventions     Readmission Risk Interventions Readmission Risk Prevention Plan 06/04/2019  Transportation Screening Complete  PCP or Specialist Appt within 5-7 Days Complete  Home Care Screening Complete  Medication Review (RN CM) Complete  Some recent data might be hidden

## 2021-01-25 DIAGNOSIS — M869 Osteomyelitis, unspecified: Secondary | ICD-10-CM | POA: Diagnosis not present

## 2021-01-25 DIAGNOSIS — R531 Weakness: Secondary | ICD-10-CM | POA: Diagnosis not present

## 2021-01-25 DIAGNOSIS — U071 COVID-19: Secondary | ICD-10-CM | POA: Diagnosis not present

## 2021-01-26 DIAGNOSIS — E46 Unspecified protein-calorie malnutrition: Secondary | ICD-10-CM | POA: Diagnosis not present

## 2021-01-26 DIAGNOSIS — I10 Essential (primary) hypertension: Secondary | ICD-10-CM | POA: Diagnosis not present

## 2021-01-26 DIAGNOSIS — J449 Chronic obstructive pulmonary disease, unspecified: Secondary | ICD-10-CM | POA: Diagnosis not present

## 2021-01-26 DIAGNOSIS — S065X9A Traumatic subdural hemorrhage with loss of consciousness of unspecified duration, initial encounter: Secondary | ICD-10-CM | POA: Diagnosis not present

## 2021-01-27 DIAGNOSIS — E46 Unspecified protein-calorie malnutrition: Secondary | ICD-10-CM | POA: Diagnosis not present

## 2021-01-27 DIAGNOSIS — J449 Chronic obstructive pulmonary disease, unspecified: Secondary | ICD-10-CM | POA: Diagnosis not present

## 2021-01-27 DIAGNOSIS — F32A Depression, unspecified: Secondary | ICD-10-CM | POA: Diagnosis not present

## 2021-01-27 DIAGNOSIS — I10 Essential (primary) hypertension: Secondary | ICD-10-CM | POA: Diagnosis not present

## 2021-01-27 DIAGNOSIS — D649 Anemia, unspecified: Secondary | ICD-10-CM | POA: Diagnosis not present

## 2021-02-02 DIAGNOSIS — E872 Acidosis: Secondary | ICD-10-CM | POA: Diagnosis not present

## 2021-02-02 DIAGNOSIS — I10 Essential (primary) hypertension: Secondary | ICD-10-CM | POA: Diagnosis not present

## 2021-02-02 DIAGNOSIS — I639 Cerebral infarction, unspecified: Secondary | ICD-10-CM | POA: Diagnosis not present

## 2021-02-02 DIAGNOSIS — E46 Unspecified protein-calorie malnutrition: Secondary | ICD-10-CM | POA: Diagnosis not present

## 2021-02-02 DIAGNOSIS — E875 Hyperkalemia: Secondary | ICD-10-CM | POA: Diagnosis not present

## 2021-02-02 DIAGNOSIS — F32A Depression, unspecified: Secondary | ICD-10-CM | POA: Diagnosis not present

## 2021-02-09 DIAGNOSIS — F32A Depression, unspecified: Secondary | ICD-10-CM | POA: Diagnosis not present

## 2021-02-09 DIAGNOSIS — Z720 Tobacco use: Secondary | ICD-10-CM | POA: Diagnosis not present

## 2021-02-09 DIAGNOSIS — I10 Essential (primary) hypertension: Secondary | ICD-10-CM | POA: Diagnosis not present

## 2021-02-09 DIAGNOSIS — I639 Cerebral infarction, unspecified: Secondary | ICD-10-CM | POA: Diagnosis not present

## 2021-02-09 DIAGNOSIS — J449 Chronic obstructive pulmonary disease, unspecified: Secondary | ICD-10-CM | POA: Diagnosis not present

## 2021-02-09 DIAGNOSIS — E46 Unspecified protein-calorie malnutrition: Secondary | ICD-10-CM | POA: Diagnosis not present

## 2021-02-16 DIAGNOSIS — J449 Chronic obstructive pulmonary disease, unspecified: Secondary | ICD-10-CM | POA: Diagnosis not present

## 2021-02-16 DIAGNOSIS — I10 Essential (primary) hypertension: Secondary | ICD-10-CM | POA: Diagnosis not present

## 2021-02-16 DIAGNOSIS — E46 Unspecified protein-calorie malnutrition: Secondary | ICD-10-CM | POA: Diagnosis not present

## 2021-02-24 DIAGNOSIS — M869 Osteomyelitis, unspecified: Secondary | ICD-10-CM | POA: Diagnosis not present

## 2021-02-24 DIAGNOSIS — R531 Weakness: Secondary | ICD-10-CM | POA: Diagnosis not present

## 2021-02-24 DIAGNOSIS — U071 COVID-19: Secondary | ICD-10-CM | POA: Diagnosis not present

## 2021-03-13 ENCOUNTER — Emergency Department (HOSPITAL_COMMUNITY): Payer: Medicare Other

## 2021-03-13 ENCOUNTER — Inpatient Hospital Stay (HOSPITAL_COMMUNITY)
Admission: EM | Admit: 2021-03-13 | Discharge: 2021-03-22 | DRG: 100 | Disposition: A | Discharge status: Skilled Nursing Facility | Payer: Medicare Other | Attending: Internal Medicine | Admitting: Internal Medicine

## 2021-03-13 DIAGNOSIS — E440 Moderate protein-calorie malnutrition: Secondary | ICD-10-CM | POA: Diagnosis not present

## 2021-03-13 DIAGNOSIS — I472 Ventricular tachycardia, unspecified: Secondary | ICD-10-CM

## 2021-03-13 DIAGNOSIS — R278 Other lack of coordination: Secondary | ICD-10-CM | POA: Diagnosis not present

## 2021-03-13 DIAGNOSIS — Z8673 Personal history of transient ischemic attack (TIA), and cerebral infarction without residual deficits: Secondary | ICD-10-CM

## 2021-03-13 DIAGNOSIS — R404 Transient alteration of awareness: Secondary | ICD-10-CM | POA: Diagnosis not present

## 2021-03-13 DIAGNOSIS — I10 Essential (primary) hypertension: Secondary | ICD-10-CM | POA: Diagnosis not present

## 2021-03-13 DIAGNOSIS — I639 Cerebral infarction, unspecified: Secondary | ICD-10-CM | POA: Diagnosis not present

## 2021-03-13 DIAGNOSIS — E869 Volume depletion, unspecified: Secondary | ICD-10-CM | POA: Diagnosis present

## 2021-03-13 DIAGNOSIS — G9340 Encephalopathy, unspecified: Secondary | ICD-10-CM

## 2021-03-13 DIAGNOSIS — F17210 Nicotine dependence, cigarettes, uncomplicated: Secondary | ICD-10-CM | POA: Diagnosis not present

## 2021-03-13 DIAGNOSIS — J90 Pleural effusion, not elsewhere classified: Secondary | ICD-10-CM | POA: Diagnosis not present

## 2021-03-13 DIAGNOSIS — R569 Unspecified convulsions: Secondary | ICD-10-CM | POA: Diagnosis not present

## 2021-03-13 DIAGNOSIS — L89159 Pressure ulcer of sacral region, unspecified stage: Secondary | ICD-10-CM | POA: Diagnosis not present

## 2021-03-13 DIAGNOSIS — I493 Ventricular premature depolarization: Secondary | ICD-10-CM | POA: Diagnosis present

## 2021-03-13 DIAGNOSIS — R2689 Other abnormalities of gait and mobility: Secondary | ICD-10-CM | POA: Diagnosis not present

## 2021-03-13 DIAGNOSIS — Z8616 Personal history of COVID-19: Secondary | ICD-10-CM

## 2021-03-13 DIAGNOSIS — N179 Acute kidney failure, unspecified: Secondary | ICD-10-CM

## 2021-03-13 DIAGNOSIS — Z8249 Family history of ischemic heart disease and other diseases of the circulatory system: Secondary | ICD-10-CM

## 2021-03-13 DIAGNOSIS — J9690 Respiratory failure, unspecified, unspecified whether with hypoxia or hypercapnia: Secondary | ICD-10-CM | POA: Diagnosis not present

## 2021-03-13 DIAGNOSIS — M109 Gout, unspecified: Secondary | ICD-10-CM | POA: Diagnosis present

## 2021-03-13 DIAGNOSIS — L8990 Pressure ulcer of unspecified site, unspecified stage: Secondary | ICD-10-CM | POA: Insufficient documentation

## 2021-03-13 DIAGNOSIS — M6281 Muscle weakness (generalized): Secondary | ICD-10-CM | POA: Diagnosis not present

## 2021-03-13 DIAGNOSIS — Z681 Body mass index (BMI) 19 or less, adult: Secondary | ICD-10-CM

## 2021-03-13 DIAGNOSIS — S065X0S Traumatic subdural hemorrhage without loss of consciousness, sequela: Secondary | ICD-10-CM | POA: Diagnosis not present

## 2021-03-13 DIAGNOSIS — F10239 Alcohol dependence with withdrawal, unspecified: Secondary | ICD-10-CM | POA: Diagnosis present

## 2021-03-13 DIAGNOSIS — D649 Anemia, unspecified: Secondary | ICD-10-CM | POA: Diagnosis present

## 2021-03-13 DIAGNOSIS — Z95810 Presence of automatic (implantable) cardiac defibrillator: Secondary | ICD-10-CM | POA: Diagnosis not present

## 2021-03-13 DIAGNOSIS — R739 Hyperglycemia, unspecified: Secondary | ICD-10-CM | POA: Diagnosis not present

## 2021-03-13 DIAGNOSIS — J449 Chronic obstructive pulmonary disease, unspecified: Secondary | ICD-10-CM | POA: Diagnosis present

## 2021-03-13 DIAGNOSIS — E872 Acidosis: Secondary | ICD-10-CM | POA: Diagnosis present

## 2021-03-13 DIAGNOSIS — Z20822 Contact with and (suspected) exposure to covid-19: Secondary | ICD-10-CM | POA: Diagnosis present

## 2021-03-13 DIAGNOSIS — Z743 Need for continuous supervision: Secondary | ICD-10-CM | POA: Diagnosis not present

## 2021-03-13 DIAGNOSIS — R296 Repeated falls: Secondary | ICD-10-CM | POA: Diagnosis not present

## 2021-03-13 DIAGNOSIS — Z79899 Other long term (current) drug therapy: Secondary | ICD-10-CM

## 2021-03-13 DIAGNOSIS — Z981 Arthrodesis status: Secondary | ICD-10-CM | POA: Diagnosis not present

## 2021-03-13 DIAGNOSIS — J9601 Acute respiratory failure with hypoxia: Secondary | ICD-10-CM | POA: Diagnosis present

## 2021-03-13 DIAGNOSIS — L89151 Pressure ulcer of sacral region, stage 1: Secondary | ICD-10-CM | POA: Diagnosis present

## 2021-03-13 DIAGNOSIS — I4729 Other ventricular tachycardia: Secondary | ICD-10-CM

## 2021-03-13 DIAGNOSIS — G40909 Epilepsy, unspecified, not intractable, without status epilepticus: Principal | ICD-10-CM | POA: Diagnosis present

## 2021-03-13 DIAGNOSIS — Z955 Presence of coronary angioplasty implant and graft: Secondary | ICD-10-CM

## 2021-03-13 DIAGNOSIS — Z7982 Long term (current) use of aspirin: Secondary | ICD-10-CM

## 2021-03-13 DIAGNOSIS — E43 Unspecified severe protein-calorie malnutrition: Secondary | ICD-10-CM | POA: Diagnosis not present

## 2021-03-13 DIAGNOSIS — S06300A Unspecified focal traumatic brain injury without loss of consciousness, initial encounter: Secondary | ICD-10-CM | POA: Diagnosis not present

## 2021-03-13 DIAGNOSIS — Z89432 Acquired absence of left foot: Secondary | ICD-10-CM

## 2021-03-13 DIAGNOSIS — Z4682 Encounter for fitting and adjustment of non-vascular catheter: Secondary | ICD-10-CM | POA: Diagnosis not present

## 2021-03-13 DIAGNOSIS — I739 Peripheral vascular disease, unspecified: Secondary | ICD-10-CM | POA: Diagnosis present

## 2021-03-13 DIAGNOSIS — I499 Cardiac arrhythmia, unspecified: Secondary | ICD-10-CM | POA: Diagnosis not present

## 2021-03-13 DIAGNOSIS — Z7401 Bed confinement status: Secondary | ICD-10-CM | POA: Diagnosis not present

## 2021-03-13 DIAGNOSIS — R6889 Other general symptoms and signs: Secondary | ICD-10-CM | POA: Diagnosis not present

## 2021-03-13 LAB — COMPREHENSIVE METABOLIC PANEL
ALT: 17 U/L (ref 0–44)
AST: 54 U/L — ABNORMAL HIGH (ref 15–41)
Albumin: 4.3 g/dL (ref 3.5–5.0)
Alkaline Phosphatase: 95 U/L (ref 38–126)
Anion gap: 33 — ABNORMAL HIGH (ref 5–15)
BUN: 16 mg/dL (ref 8–23)
CO2: 9 mmol/L — ABNORMAL LOW (ref 22–32)
Calcium: 10.2 mg/dL (ref 8.9–10.3)
Chloride: 95 mmol/L — ABNORMAL LOW (ref 98–111)
Creatinine, Ser: 1.81 mg/dL — ABNORMAL HIGH (ref 0.61–1.24)
GFR, Estimated: 41 mL/min — ABNORMAL LOW (ref >60)
Glucose, Bld: 179 mg/dL — ABNORMAL HIGH (ref 70–99)
Potassium: 3.8 mmol/L (ref 3.5–5.1)
Sodium: 137 mmol/L (ref 135–145)
Total Bilirubin: 1.6 mg/dL — ABNORMAL HIGH (ref 0.3–1.2)
Total Protein: 8 g/dL (ref 6.5–8.1)

## 2021-03-13 LAB — I-STAT ARTERIAL BLOOD GAS, ED
Acid-base deficit: 15 mmol/L — ABNORMAL HIGH (ref 0.0–2.0)
Bicarbonate: 14.2 mmol/L — ABNORMAL LOW (ref 20.0–28.0)
Calcium, Ion: 1.39 mmol/L (ref 1.15–1.40)
HCT: 35 % — ABNORMAL LOW (ref 39.0–52.0)
Hemoglobin: 11.9 g/dL — ABNORMAL LOW (ref 13.0–17.0)
O2 Saturation: 100 %
Patient temperature: 98.6
Potassium: 4.2 mmol/L (ref 3.5–5.1)
Sodium: 136 mmol/L (ref 135–145)
TCO2: 15 mmol/L — ABNORMAL LOW (ref 22–32)
pCO2 arterial: 43.6 mmHg (ref 32.0–48.0)
pH, Arterial: 7.12 — CL (ref 7.350–7.450)
pO2, Arterial: 302 mmHg — ABNORMAL HIGH (ref 83.0–108.0)

## 2021-03-13 LAB — I-STAT CHEM 8, ED
BUN: 15 mg/dL (ref 8–23)
Calcium, Ion: 1.31 mmol/L (ref 1.15–1.40)
Chloride: 106 mmol/L (ref 98–111)
Creatinine, Ser: 1.4 mg/dL — ABNORMAL HIGH (ref 0.61–1.24)
Glucose, Bld: 172 mg/dL — ABNORMAL HIGH (ref 70–99)
HCT: 44 % (ref 39.0–52.0)
Hemoglobin: 15 g/dL (ref 13.0–17.0)
Potassium: 4.5 mmol/L (ref 3.5–5.1)
Sodium: 136 mmol/L (ref 135–145)
TCO2: 15 mmol/L — ABNORMAL LOW (ref 22–32)

## 2021-03-13 LAB — CBC
HCT: 40 % (ref 39.0–52.0)
Hemoglobin: 11.6 g/dL — ABNORMAL LOW (ref 13.0–17.0)
MCH: 29.2 pg (ref 26.0–34.0)
MCHC: 29 g/dL — ABNORMAL LOW (ref 30.0–36.0)
MCV: 100.8 fL — ABNORMAL HIGH (ref 80.0–100.0)
Platelets: 228 10*3/uL (ref 150–400)
RBC: 3.97 MIL/uL — ABNORMAL LOW (ref 4.22–5.81)
RDW: 22.5 % — ABNORMAL HIGH (ref 11.5–15.5)
WBC: 15.4 10*3/uL — ABNORMAL HIGH (ref 4.0–10.5)
nRBC: 4.1 % — ABNORMAL HIGH (ref 0.0–0.2)

## 2021-03-13 LAB — RESP PANEL BY RT-PCR (FLU A&B, COVID) ARPGX2
Influenza A by PCR: NEGATIVE
Influenza B by PCR: NEGATIVE
SARS Coronavirus 2 by RT PCR: NEGATIVE

## 2021-03-13 LAB — ACETAMINOPHEN LEVEL: Acetaminophen (Tylenol), Serum: <10 ug/mL — ABNORMAL LOW (ref 10–30)

## 2021-03-13 LAB — MAGNESIUM: Magnesium: 3.9 mg/dL — ABNORMAL HIGH (ref 1.7–2.4)

## 2021-03-13 LAB — ETHANOL: Alcohol, Ethyl (B): <10 mg/dL (ref <10)

## 2021-03-13 LAB — PHOSPHORUS: Phosphorus: 11.4 mg/dL — ABNORMAL HIGH (ref 2.5–4.6)

## 2021-03-13 LAB — SALICYLATE LEVEL: Salicylate Lvl: <7 mg/dL — ABNORMAL LOW (ref 7.0–30.0)

## 2021-03-13 IMAGING — CT CT HEAD W/O CM
3 series · 16 of 37 positions shown, 18 images · non-contrast
Comparison: [DATE]

CLINICAL DATA: Found down, seizure, unresponsive

EXAM:
CT HEAD WITHOUT CONTRAST
TECHNIQUE: Contiguous axial images were obtained from the base of the skull
through the vertex without intravenous contrast.

[Series 3: head without · axial · non-contrast · 0.43mm/px · z∈[-152,-22]mm · 7 of 36 slices shown, 9 images]
[im 5/36  brain]
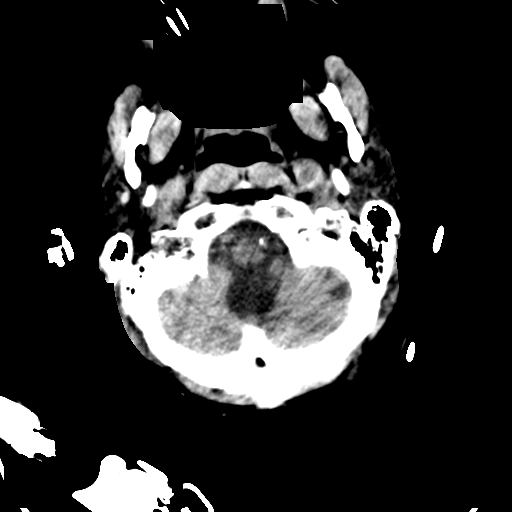
[im 5/36  bone]
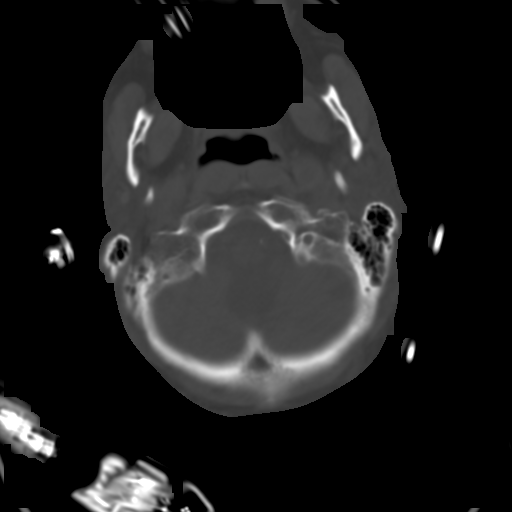
[im 9/36  brain]
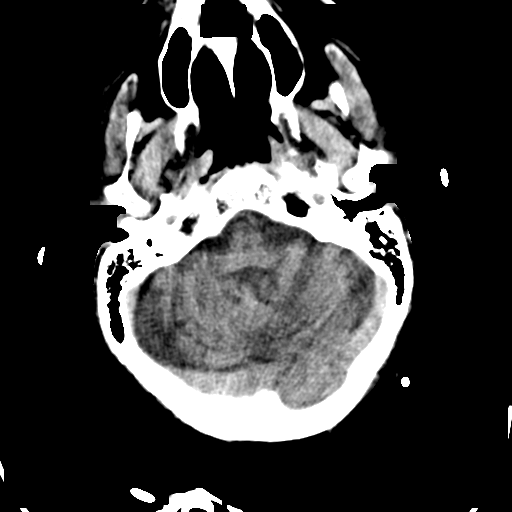
[im 14/36  brain]
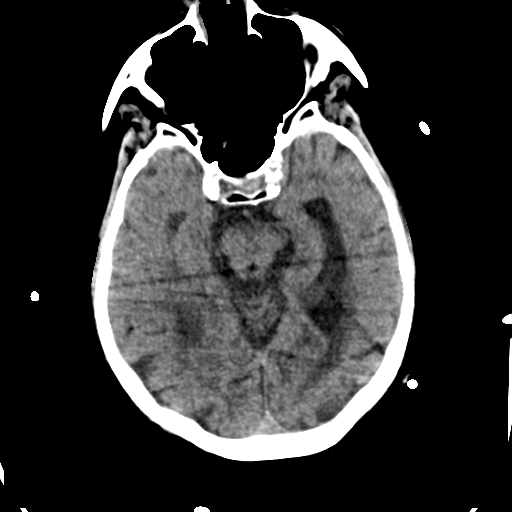
[im 18/36  brain]
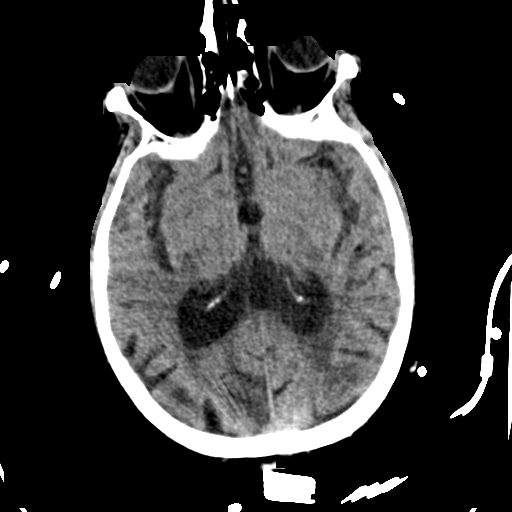
[im 22/36  brain]
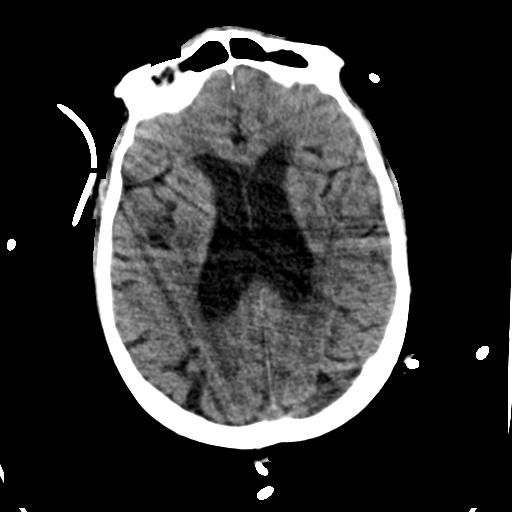
[im 22/36  bone]
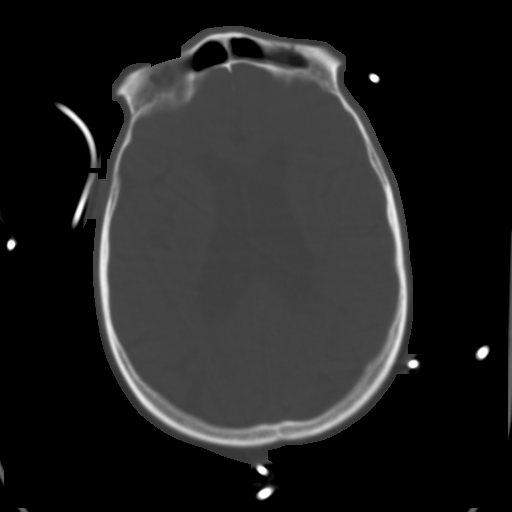
[im 27/36  brain]
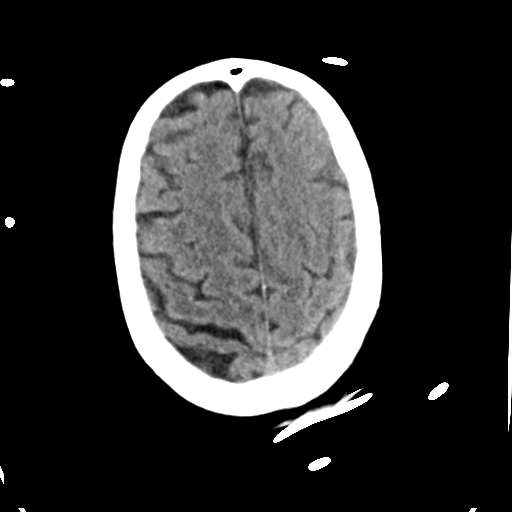
[im 31/36  brain]
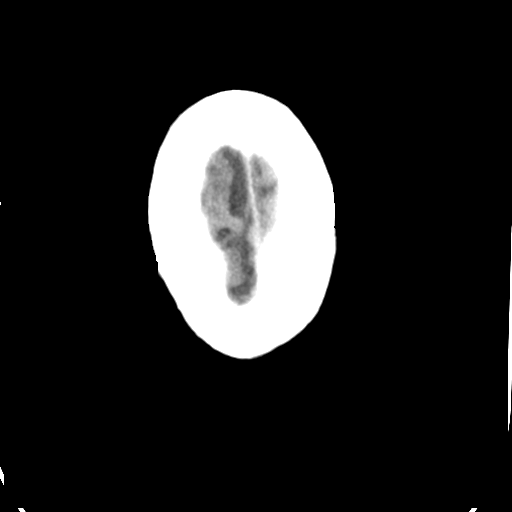

[Series 4: head bone · axial · 0.43mm/px · z∈[-156,-50]mm · 6 of 89 slices shown]
[im 9/89  bone]
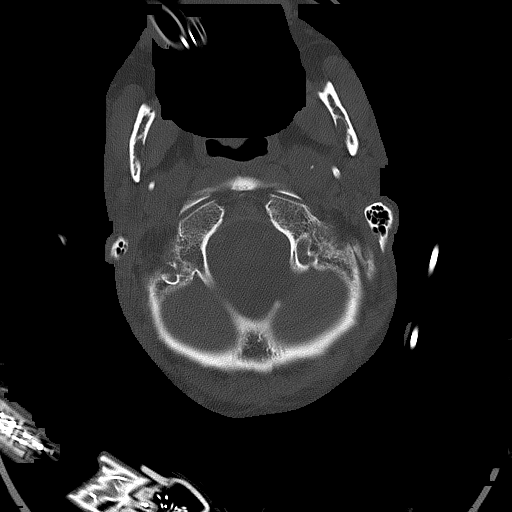
[im 18/89  bone]
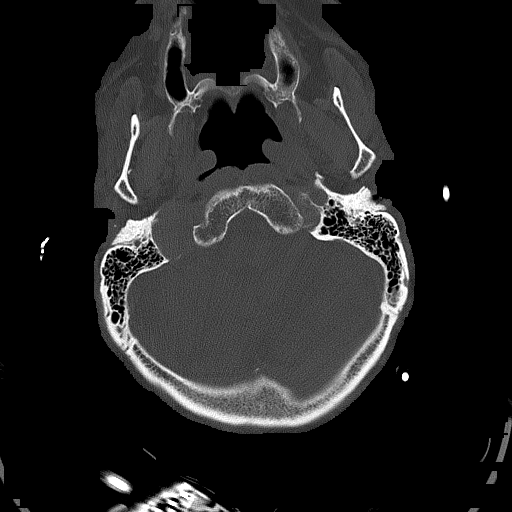
[im 27/89  bone]
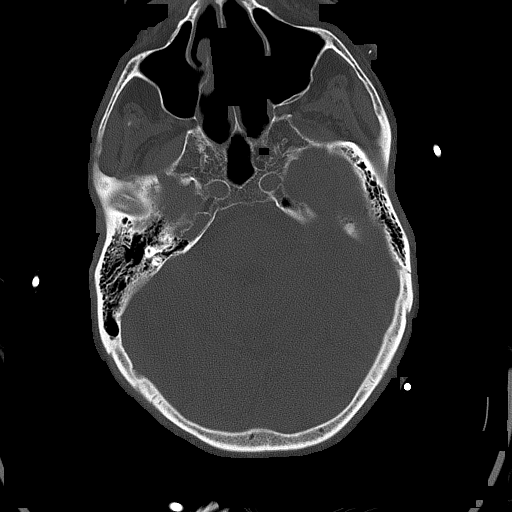
[im 40/89  bone]
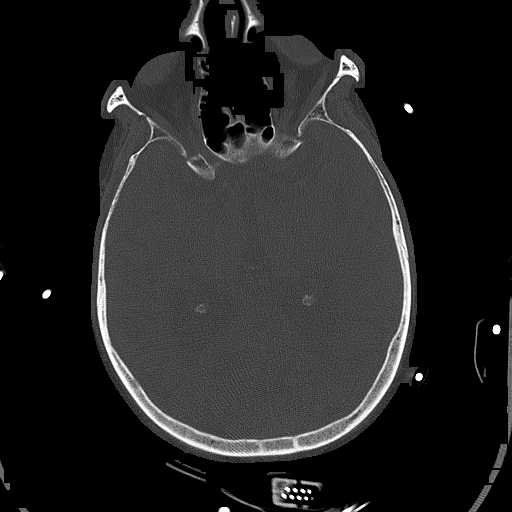
[im 49/89  bone]
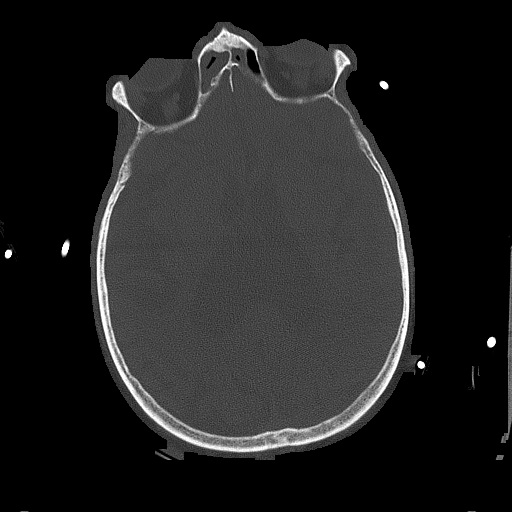
[im 62/89  bone]
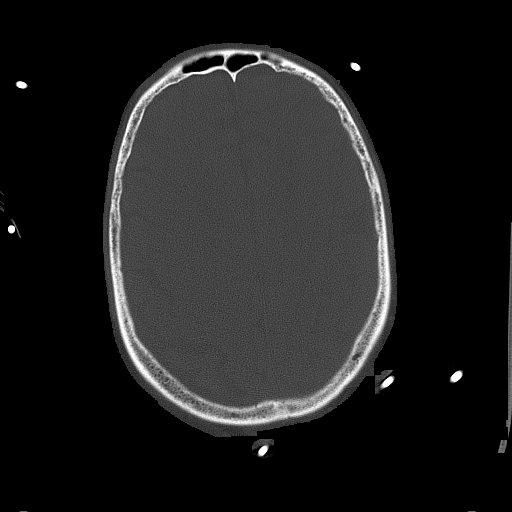

[Series 6: head without sag · sagittal · non-contrast · 0.30mm/px · 3 of 63 slices shown]
[im 21/63  brain]
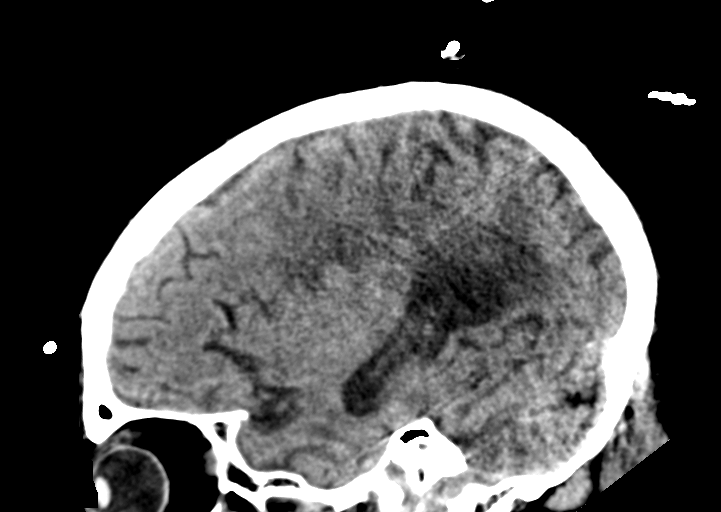
[im 32/63  brain]
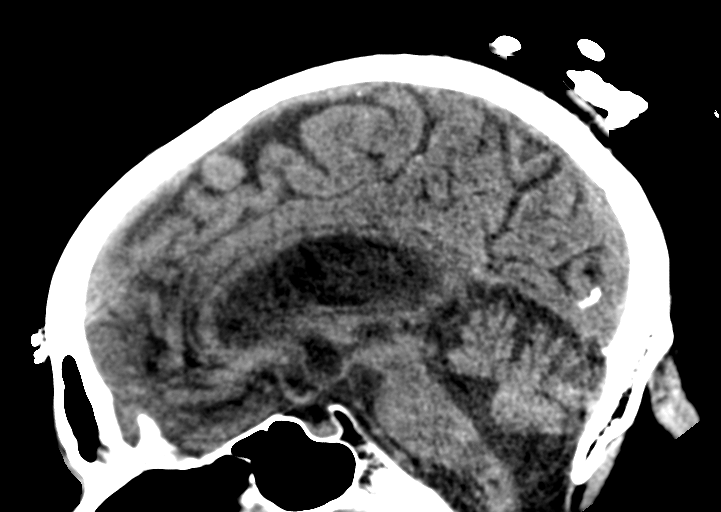
[im 42/63  brain]
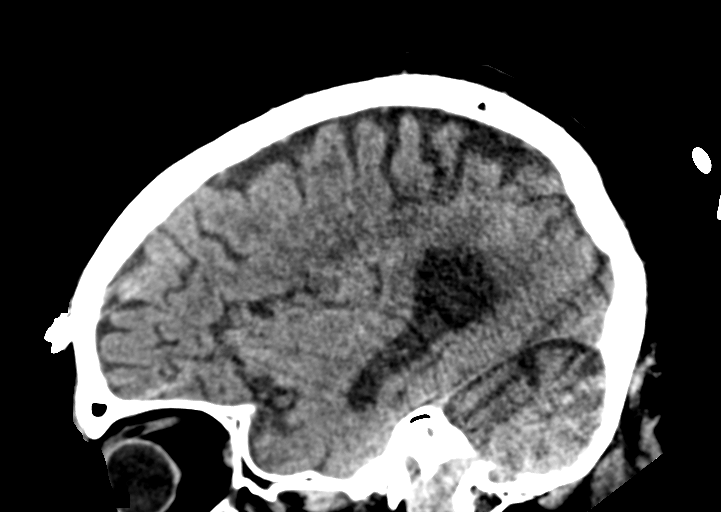

[16 of 37 positions shown; findings below may reference images not displayed]

FINDINGS: Brain: Interval resolution of the left-sided subdural hematoma seen
previously, with mild residual dural thickening along the left
frontal convexity. No acute infarct or hemorrhage. Chronic small
vessel ischemic changes throughout the periventricular white matter
are stable. Lateral ventricles and midline structures are
unremarkable. No acute extra-axial fluid collections. No mass
effect.

Vascular: No hyperdense vessel or unexpected calcification.

Skull: Normal. Negative for fracture or focal lesion.

Sinuses/Orbits: Mild mucosal. Polypoid mucosal thickening of the
ethmoid and frontal sinuses thickening right maxillary sinus
unchanged.

Other: None.
IMPRESSION: 1. No acute intracranial process.
2. Interval resolution of the left-sided subdural hematoma seen
previously, with mild residual dural thickening along the left
frontal convexity.

## 2021-03-13 IMAGING — DX DG CHEST 1V PORT
1 series · 2 of 2 positions shown · non-contrast
Comparison: [DATE]

CLINICAL DATA: Intubated

EXAM:
PORTABLE CHEST 1 VIEW

[Series 1: chest · 0.14mm/px · 2 of 2 slices shown]
[im 1/2]
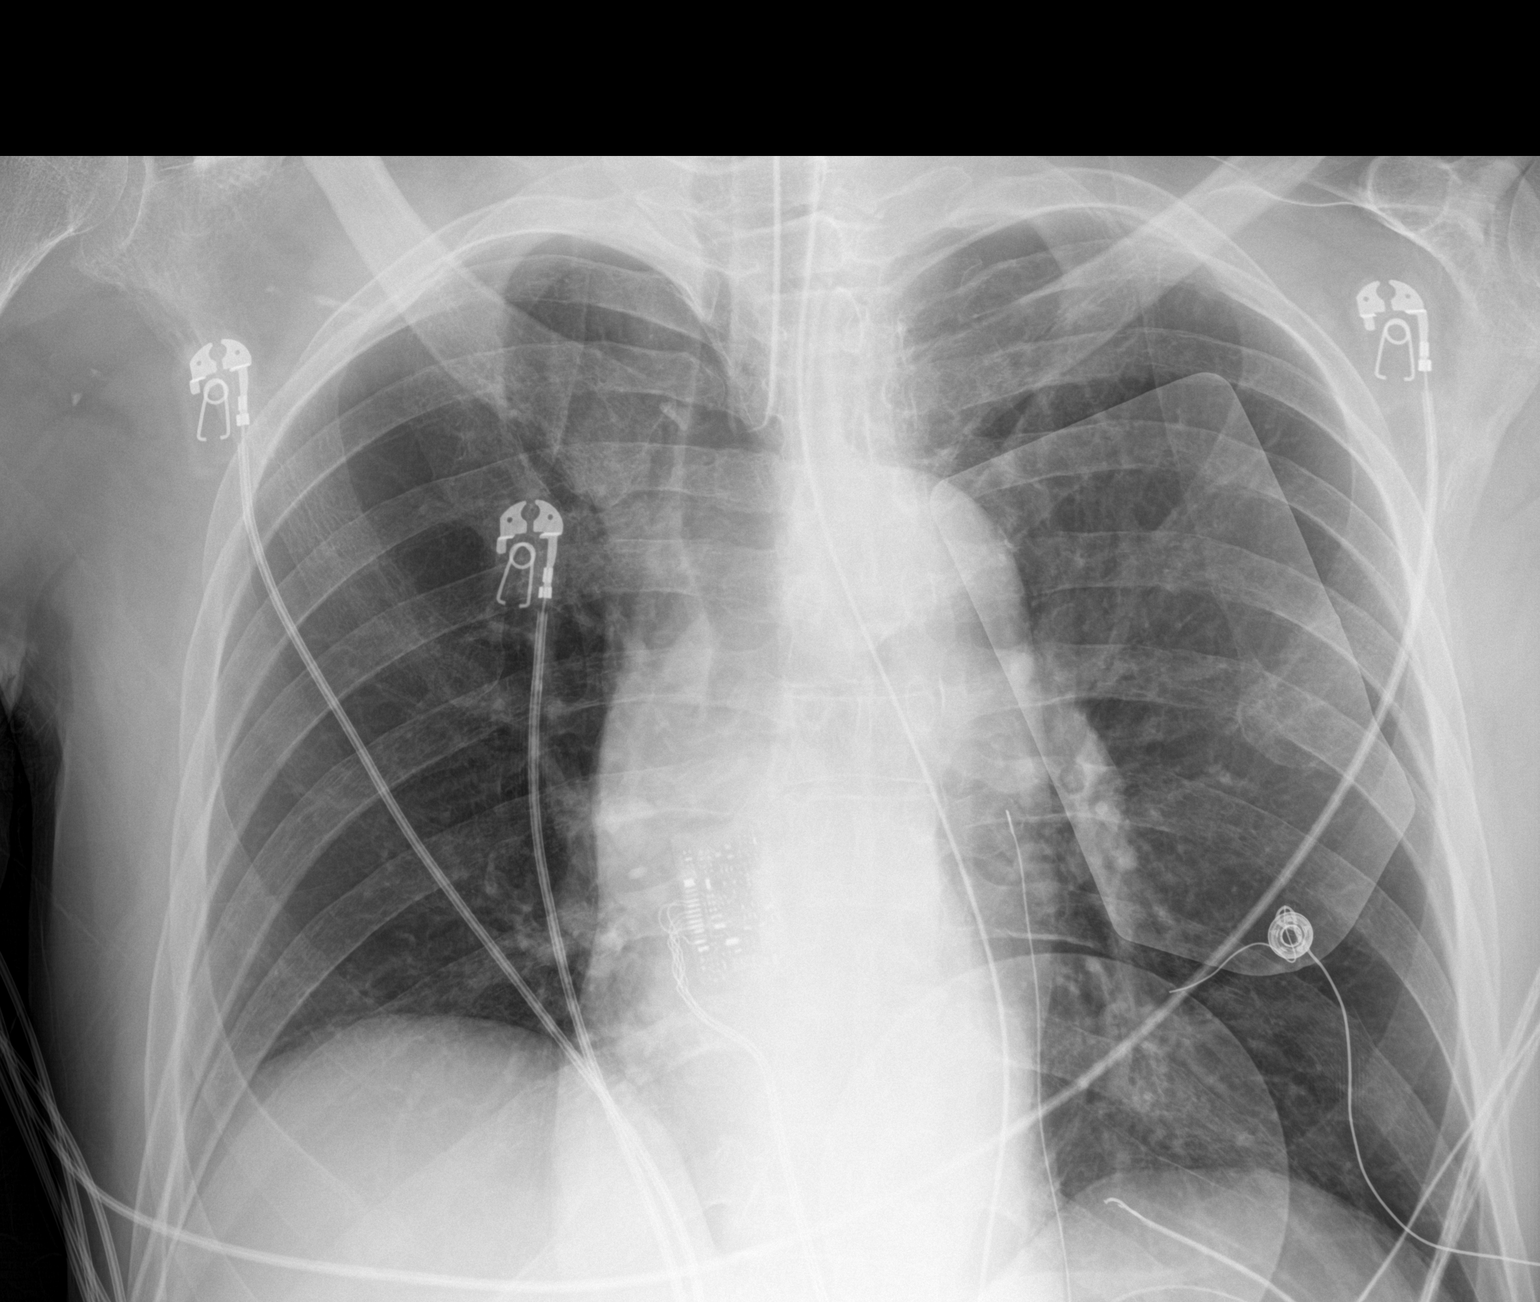
[im 2/2]
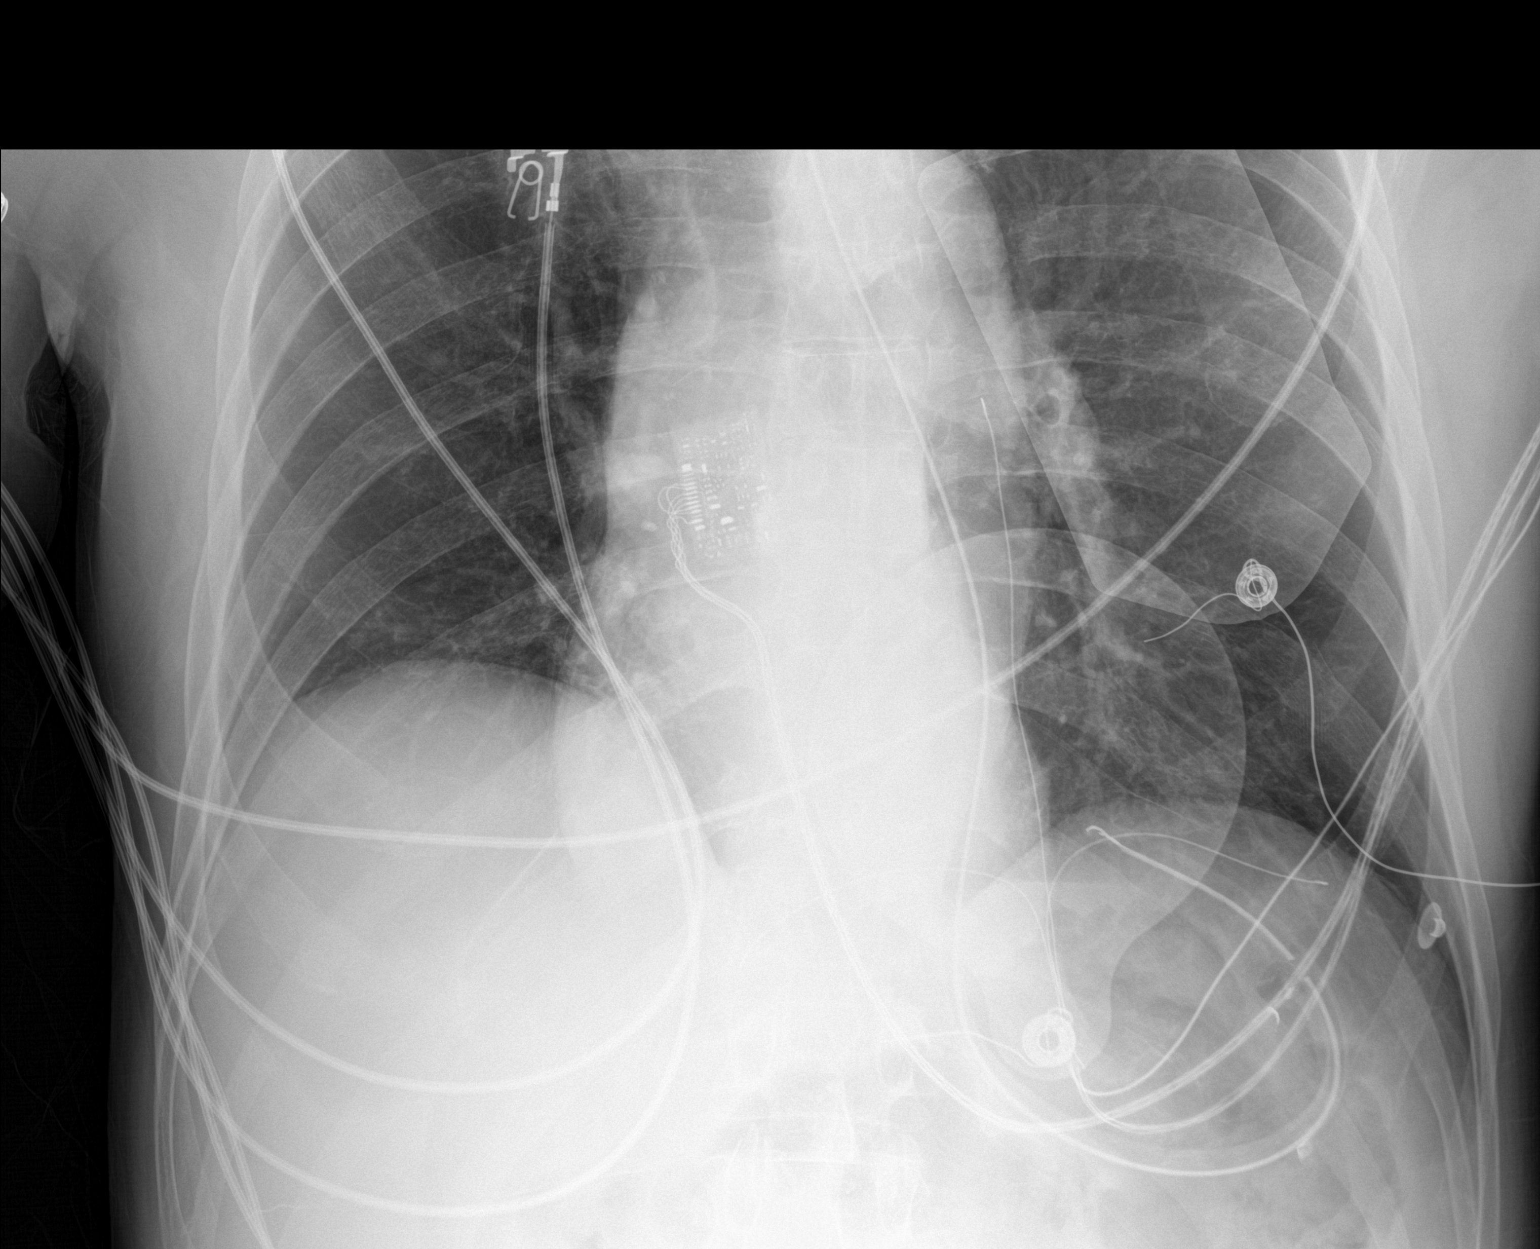

[2 of 2 positions shown; findings below may reference images not displayed]

FINDINGS: 2 frontal views of the chest demonstrate endotracheal tube overlying
tracheal air column tip at level of thoracic inlet. Enteric catheter
passes below diaphragm tip overlying the gastric fundus. External
defibrillator pads overlie the left chest. The cardiac silhouette is
stable. No acute airspace disease, effusion, or pneumothorax. No
acute bony abnormalities.
IMPRESSION: 1. No complication after intubation.
2. No acute intrathoracic process.

## 2021-03-13 MED ORDER — AMIODARONE HCL IN DEXTROSE 360-4.14 MG/200ML-% IV SOLN
30.0000 mg/h | INTRAVENOUS | Status: AC
  Administered 2021-03-13: 60 mg/h via INTRAVENOUS
  Administered 2021-03-14: 30 mg/h via INTRAVENOUS
  Filled 2021-03-13: qty 200

## 2021-03-13 MED ORDER — FAMOTIDINE IN NACL 20-0.9 MG/50ML-% IV SOLN
20.0000 mg | Freq: Two times a day (BID) | INTRAVENOUS | Status: DC
  Administered 2021-03-14 – 2021-03-16 (×5): 20 mg via INTRAVENOUS
  Filled 2021-03-13 (×7): qty 50

## 2021-03-13 MED ORDER — SODIUM CHLORIDE 0.9 % IV SOLN: INTRAVENOUS | Status: DC | PRN

## 2021-03-13 MED ORDER — PROPOFOL 1000 MG/100ML IV EMUL
5.0000 ug/kg/min | INTRAVENOUS | Status: DC
  Administered 2021-03-14: 35 ug/kg/min via INTRAVENOUS
  Administered 2021-03-14: 10 ug/kg/min via INTRAVENOUS
  Filled 2021-03-13 (×2): qty 100

## 2021-03-13 MED ORDER — SODIUM BICARBONATE 8.4 % IV SOLN
INTRAVENOUS | Status: DC
  Filled 2021-03-13 (×2): qty 1000

## 2021-03-13 MED ORDER — IPRATROPIUM-ALBUTEROL 0.5-2.5 (3) MG/3ML IN SOLN
3.0000 mL | Freq: Four times a day (QID) | RESPIRATORY_TRACT | Status: DC
  Administered 2021-03-14 – 2021-03-15 (×6): 3 mL via RESPIRATORY_TRACT
  Filled 2021-03-13 (×7): qty 3

## 2021-03-13 MED ORDER — ACETAMINOPHEN 325 MG PO TABS
650.0000 mg | ORAL_TABLET | ORAL | Status: DC | PRN
  Administered 2021-03-17 – 2021-03-20 (×2): 650 mg via ORAL
  Filled 2021-03-13 (×2): qty 2

## 2021-03-13 MED ORDER — ETOMIDATE 2 MG/ML IV SOLN
INTRAVENOUS | Status: AC | PRN
  Administered 2021-03-13: 20 mg via INTRAVENOUS

## 2021-03-13 MED ORDER — ROCURONIUM BROMIDE 50 MG/5ML IV SOLN
INTRAVENOUS | Status: AC | PRN
  Administered 2021-03-13: 100 mg via INTRAVENOUS

## 2021-03-13 MED ORDER — SODIUM BICARBONATE 8.4 % IV SOLN
50.0000 meq | Freq: Once | INTRAVENOUS | Status: AC
  Administered 2021-03-13: 50 meq via INTRAVENOUS

## 2021-03-13 MED ORDER — CALCIUM GLUCONATE-NACL 1-0.675 GM/50ML-% IV SOLN: 1.0000 g | Freq: Once | INTRAVENOUS | Status: DC

## 2021-03-13 MED ORDER — SODIUM CHLORIDE 0.9 % IV SOLN
3000.0000 mg | Freq: Once | INTRAVENOUS | Status: AC
  Administered 2021-03-13: 3000 mg via INTRAVENOUS
  Filled 2021-03-13: qty 30

## 2021-03-13 MED ORDER — LORAZEPAM 2 MG/ML IJ SOLN
INTRAMUSCULAR | Status: AC
  Filled 2021-03-13: qty 1

## 2021-03-13 MED ORDER — ONDANSETRON HCL 4 MG/2ML IJ SOLN: 4.0000 mg | Freq: Four times a day (QID) | INTRAMUSCULAR | Status: DC | PRN

## 2021-03-13 MED ORDER — POLYETHYLENE GLYCOL 3350 17 G PO PACK: 17.0000 g | PACK | Freq: Every day | ORAL | Status: DC | PRN

## 2021-03-13 MED ORDER — FAMOTIDINE IN NACL 20-0.9 MG/50ML-% IV SOLN: 20.0000 mg | Freq: Two times a day (BID) | INTRAVENOUS | Status: DC

## 2021-03-13 MED ORDER — DEXTROSE 5 % IV SOLN: 300.0000 mg | Freq: Once | INTRAVENOUS | Status: DC

## 2021-03-13 MED ORDER — DOCUSATE SODIUM 100 MG PO CAPS: 100.0000 mg | ORAL_CAPSULE | Freq: Two times a day (BID) | ORAL | Status: DC | PRN

## 2021-03-13 MED ORDER — ENOXAPARIN SODIUM 40 MG/0.4ML IJ SOSY
40.0000 mg | PREFILLED_SYRINGE | INTRAMUSCULAR | Status: DC
  Administered 2021-03-14 – 2021-03-22 (×9): 40 mg via SUBCUTANEOUS
  Filled 2021-03-13 (×10): qty 0.4

## 2021-03-13 MED ORDER — DEXAMETHASONE SODIUM PHOSPHATE 10 MG/ML IJ SOLN
10.0000 mg | Freq: Once | INTRAMUSCULAR | Status: AC
  Administered 2021-03-13: 10 mg via INTRAVENOUS
  Filled 2021-03-13: qty 1

## 2021-03-13 MED ORDER — CALCIUM CHLORIDE 10 % IV SOLN
1.0000 g | Freq: Once | INTRAVENOUS | Status: AC
  Administered 2021-03-13: 1 g via INTRAVENOUS

## 2021-03-13 MED ORDER — DEXTROSE 5 % IV SOLN
INTRAVENOUS | Status: AC | PRN
  Administered 2021-03-13: 150 mg via INTRAVENOUS

## 2021-03-13 MED ORDER — MAGNESIUM SULFATE 2 GM/50ML IV SOLN
2.0000 g | Freq: Once | INTRAVENOUS | Status: AC
  Administered 2021-03-13: 2 g via INTRAVENOUS

## 2021-03-13 MED ORDER — AMIODARONE IV BOLUS ONLY 150 MG/100ML: 150.0000 mg | Freq: Once | INTRAVENOUS | Status: DC

## 2021-03-13 NOTE — Procedures (Signed)
Arterial Catheter Insertion Procedure Note  BERNARDINO DOWELL  [id number]  April 12, 1956  Date:03/13/21  Time:11:19 PM    Provider Performing: Dimple Nanas    Procedure: Insertion of Arterial Line [phone]) without US guidance  Indication(s) Blood pressure monitoring and/or need for frequent ABGs  Consent Unable to obtain consent due to emergent nature of procedure.  Anesthesia None   Time Out Verified patient identification, verified procedure, site/side was marked, verified correct patient position, special equipment/implants available, medications/allergies/relevant history reviewed, required imaging and test results available.   Sterile Technique Maximal sterile technique including full sterile barrier drape, hand hygiene, sterile gown, sterile gloves, mask, hair covering, sterile ultrasound probe cover (if used).   Procedure Description Area of catheter insertion was cleaned with chlorhexidine and draped in sterile fashion. With real-time ultrasound guidance an arterial catheter was placed into the right radial artery.  Appropriate arterial tracings confirmed on monitor.     Complications/Tolerance None; patient tolerated the procedure well.   EBL Minimal   Specimen(s) None
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
## 2021-03-13 NOTE — ED Triage Notes (Signed)
Pt presents EMS found down by daughter.  3 witnessed seizures No iv access.  5 mg IM versed given.  Pt on NRB and unresponsive with gaze upward.  IV access obtained and RSI initiated.
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
## 2021-03-14 ENCOUNTER — Inpatient Hospital Stay (HOSPITAL_COMMUNITY): Payer: Medicare Other

## 2021-03-14 DIAGNOSIS — L8990 Pressure ulcer of unspecified site, unspecified stage: Secondary | ICD-10-CM | POA: Insufficient documentation

## 2021-03-14 DIAGNOSIS — N179 Acute kidney failure, unspecified: Secondary | ICD-10-CM

## 2021-03-14 DIAGNOSIS — I472 Ventricular tachycardia: Secondary | ICD-10-CM

## 2021-03-14 DIAGNOSIS — G9340 Encephalopathy, unspecified: Secondary | ICD-10-CM

## 2021-03-14 DIAGNOSIS — R569 Unspecified convulsions: Secondary | ICD-10-CM | POA: Diagnosis not present

## 2021-03-14 LAB — RAPID URINE DRUG SCREEN, HOSP PERFORMED
Amphetamines: NOT DETECTED
Barbiturates: NOT DETECTED
Benzodiazepines: POSITIVE — AB
Cocaine: NOT DETECTED
Opiates: NOT DETECTED
Tetrahydrocannabinol: NOT DETECTED

## 2021-03-14 LAB — I-STAT ARTERIAL BLOOD GAS, ED
Acid-base deficit: 2 mmol/L (ref 0.0–2.0)
Bicarbonate: 21.5 mmol/L (ref 20.0–28.0)
Calcium, Ion: 1.23 mmol/L (ref 1.15–1.40)
HCT: 36 % — ABNORMAL LOW (ref 39.0–52.0)
Hemoglobin: 12.2 g/dL — ABNORMAL LOW (ref 13.0–17.0)
O2 Saturation: 100 %
Patient temperature: 98.6
Potassium: 4.1 mmol/L (ref 3.5–5.1)
Sodium: 135 mmol/L (ref 135–145)
TCO2: 22 mmol/L (ref 22–32)
pCO2 arterial: 30.9 mmHg — ABNORMAL LOW (ref 32.0–48.0)
pH, Arterial: 7.451 — ABNORMAL HIGH (ref 7.350–7.450)
pO2, Arterial: 240 mmHg — ABNORMAL HIGH (ref 83.0–108.0)

## 2021-03-14 LAB — POCT I-STAT 7, (LYTES, BLD GAS, ICA,H+H)
Acid-Base Excess: 2 mmol/L (ref 0.0–2.0)
Bicarbonate: 26.3 mmol/L (ref 20.0–28.0)
Calcium, Ion: 1.15 mmol/L (ref 1.15–1.40)
HCT: 35 % — ABNORMAL LOW (ref 39.0–52.0)
Hemoglobin: 11.9 g/dL — ABNORMAL LOW (ref 13.0–17.0)
O2 Saturation: 100 %
Patient temperature: 98.6
Potassium: 3.5 mmol/L (ref 3.5–5.1)
Sodium: 132 mmol/L — ABNORMAL LOW (ref 135–145)
TCO2: 27 mmol/L (ref 22–32)
pCO2 arterial: 37.8 mmHg (ref 32.0–48.0)
pH, Arterial: 7.451 — ABNORMAL HIGH (ref 7.350–7.450)
pO2, Arterial: 246 mmHg — ABNORMAL HIGH (ref 83.0–108.0)

## 2021-03-14 LAB — GLUCOSE, CAPILLARY
Glucose-Capillary: 119 mg/dL — ABNORMAL HIGH (ref 70–99)
Glucose-Capillary: 134 mg/dL — ABNORMAL HIGH (ref 70–99)
Glucose-Capillary: 153 mg/dL — ABNORMAL HIGH (ref 70–99)
Glucose-Capillary: 179 mg/dL — ABNORMAL HIGH (ref 70–99)

## 2021-03-14 LAB — CBC
HCT: 33.5 % — ABNORMAL LOW (ref 39.0–52.0)
HCT: 32.3 % — ABNORMAL LOW (ref 39.0–52.0)
Hemoglobin: 11.1 g/dL — ABNORMAL LOW (ref 13.0–17.0)
Hemoglobin: 10.3 g/dL — ABNORMAL LOW (ref 13.0–17.0)
MCH: 29.1 pg (ref 26.0–34.0)
MCH: 28.4 pg (ref 26.0–34.0)
MCHC: 33.1 g/dL (ref 30.0–36.0)
MCHC: 31.9 g/dL (ref 30.0–36.0)
MCV: 87.9 fL (ref 80.0–100.0)
MCV: 89 fL (ref 80.0–100.0)
Platelets: 168 10*3/uL (ref 150–400)
Platelets: 170 10*3/uL (ref 150–400)
RBC: 3.81 MIL/uL — ABNORMAL LOW (ref 4.22–5.81)
RBC: 3.63 MIL/uL — ABNORMAL LOW (ref 4.22–5.81)
RDW: 22 % — ABNORMAL HIGH (ref 11.5–15.5)
RDW: 22.8 % — ABNORMAL HIGH (ref 11.5–15.5)
WBC: 8.5 10*3/uL (ref 4.0–10.5)
WBC: 6.9 10*3/uL (ref 4.0–10.5)
nRBC: 5.2 % — ABNORMAL HIGH (ref 0.0–0.2)
nRBC: 3.3 % — ABNORMAL HIGH (ref 0.0–0.2)

## 2021-03-14 LAB — ECHOCARDIOGRAM LIMITED
AV Mean grad: 8 mmHg
AV Peak grad: 15.1 mmHg
Ao pk vel: 1.94 m/s
Area-P 1/2: 5.62 cm2
Height: 75 in
Weight: 2240 oz

## 2021-03-14 LAB — BASIC METABOLIC PANEL
Anion gap: 18 — ABNORMAL HIGH (ref 5–15)
BUN: 12 mg/dL (ref 8–23)
CO2: 21 mmol/L — ABNORMAL LOW (ref 22–32)
Calcium: 8.6 mg/dL — ABNORMAL LOW (ref 8.9–10.3)
Chloride: 95 mmol/L — ABNORMAL LOW (ref 98–111)
Creatinine, Ser: 1.63 mg/dL — ABNORMAL HIGH (ref 0.61–1.24)
GFR, Estimated: 47 mL/min — ABNORMAL LOW (ref >60)
Glucose, Bld: 283 mg/dL — ABNORMAL HIGH (ref 70–99)
Potassium: 3.5 mmol/L (ref 3.5–5.1)
Sodium: 134 mmol/L — ABNORMAL LOW (ref 135–145)

## 2021-03-14 LAB — HEMOGLOBIN A1C
Hgb A1c MFr Bld: 4.4 % — ABNORMAL LOW (ref 4.8–5.6)
Mean Plasma Glucose: 79.58 mg/dL

## 2021-03-14 LAB — MRSA NEXT GEN BY PCR, NASAL: MRSA by PCR Next Gen: NOT DETECTED

## 2021-03-14 LAB — MAGNESIUM: Magnesium: 2.7 mg/dL — ABNORMAL HIGH (ref 1.7–2.4)

## 2021-03-14 LAB — CREATININE, SERUM
Creatinine, Ser: 1.27 mg/dL — ABNORMAL HIGH (ref 0.61–1.24)
GFR, Estimated: >60 mL/min (ref >60)

## 2021-03-14 LAB — PHOSPHORUS: Phosphorus: 6.6 mg/dL — ABNORMAL HIGH (ref 2.5–4.6)

## 2021-03-14 LAB — TROPONIN I (HIGH SENSITIVITY): Troponin I (High Sensitivity): 30 ng/L — ABNORMAL HIGH (ref <18)

## 2021-03-14 IMAGING — DX DG CHEST 1V PORT
1 series · 2 of 2 positions shown · non-contrast
Comparison: [DATE]

CLINICAL DATA: Respiratory failure.

EXAM:
PORTABLE CHEST 1 VIEW

[Series 1: chest · 0.14mm/px · 2 of 2 slices shown]
[im 1/2]
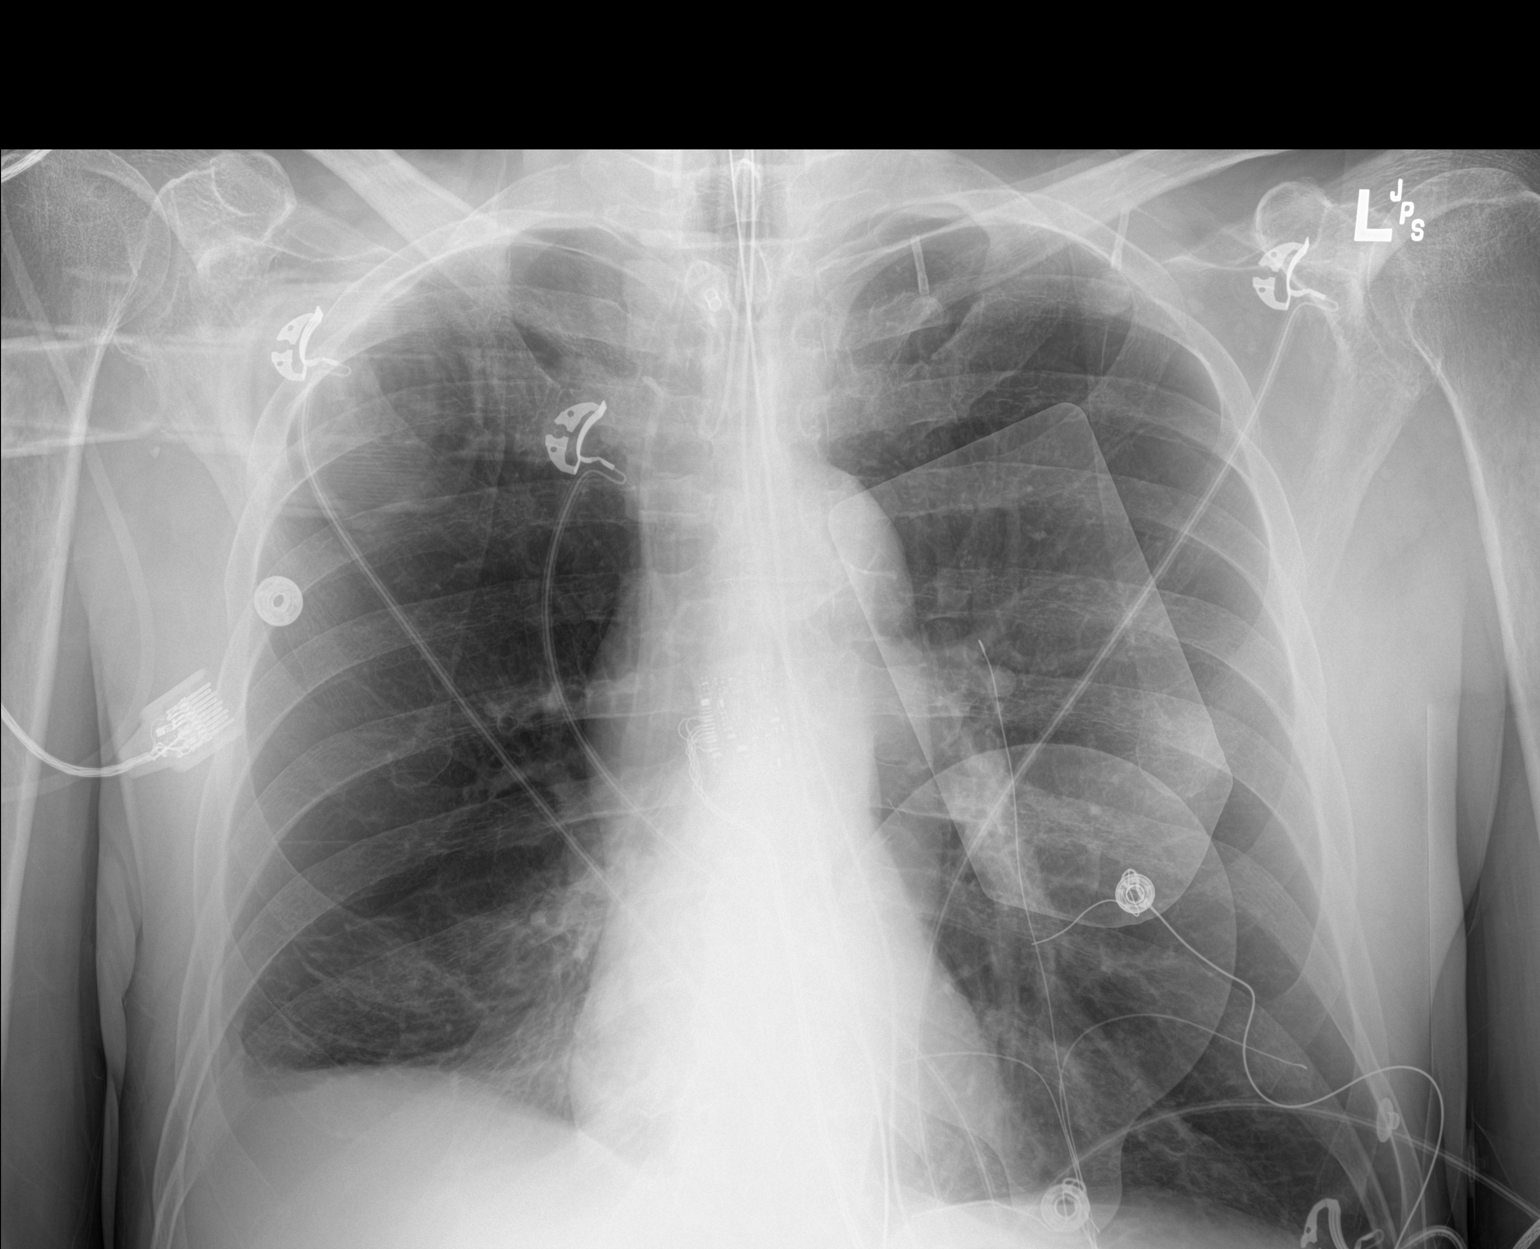
[im 2/2]
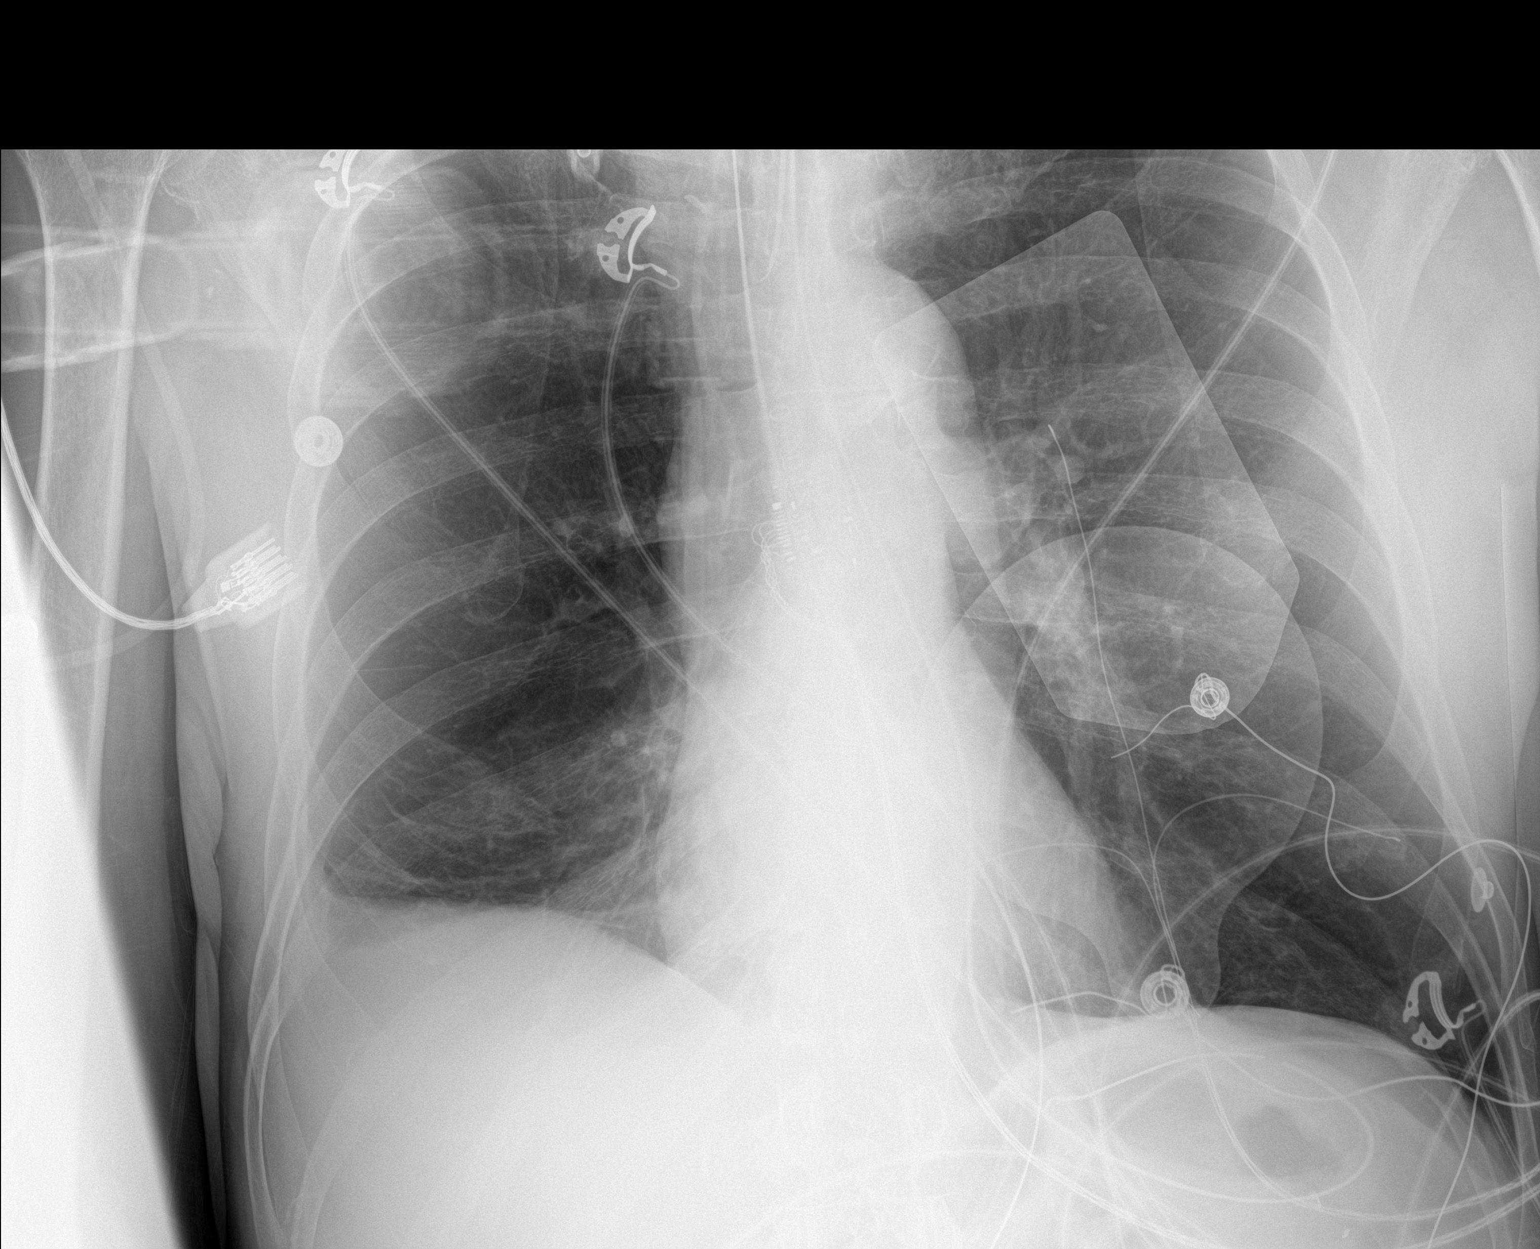

[2 of 2 positions shown; findings below may reference images not displayed]

FINDINGS: An endotracheal tube terminates below the clavicles, well above the
carina. Enteric tubes course into the abdomen. Defibrillator pads
overlie the chest. The cardiomediastinal silhouette is within normal
limits. There is new mild right basilar airspace opacity as well as
a small right pleural effusion. No pulmonary edema or pneumothorax
is identified.
IMPRESSION: New small right pleural effusion with right basilar atelectasis or
infiltrate.

## 2021-03-14 MED ORDER — LEVETIRACETAM IN NACL 1500 MG/100ML IV SOLN
1500.0000 mg | Freq: Two times a day (BID) | INTRAVENOUS | Status: DC
  Administered 2021-03-14 – 2021-03-16 (×6): 1500 mg via INTRAVENOUS
  Filled 2021-03-14 (×8): qty 100

## 2021-03-14 MED ORDER — INSULIN ASPART 100 UNIT/ML IJ SOLN
0.0000 [IU] | INTRAMUSCULAR | Status: DC
  Administered 2021-03-14 (×2): 2 [IU] via SUBCUTANEOUS
  Administered 2021-03-14: 3 [IU] via SUBCUTANEOUS
  Administered 2021-03-18 – 2021-03-19 (×2): 2 [IU] via SUBCUTANEOUS

## 2021-03-14 MED ORDER — SODIUM CHLORIDE 0.9 % IV SOLN
750.0000 mg | Freq: Two times a day (BID) | INTRAVENOUS | Status: DC
  Filled 2021-03-14 (×2): qty 7.5

## 2021-03-14 MED ORDER — VECURONIUM BROMIDE 10 MG IV SOLR
5.0000 mg | Freq: Once | INTRAVENOUS | Status: DC
  Filled 2021-03-14: qty 10

## 2021-03-14 MED ORDER — SODIUM CHLORIDE 0.9 % IV BOLUS
1000.0000 mL | Freq: Once | INTRAVENOUS | Status: AC
  Administered 2021-03-14: 1000 mL via INTRAVENOUS

## 2021-03-14 MED ORDER — HYDRALAZINE HCL 20 MG/ML IJ SOLN
10.0000 mg | INTRAMUSCULAR | Status: DC | PRN
  Administered 2021-03-14 – 2021-03-16 (×4): 10 mg via INTRAVENOUS
  Filled 2021-03-14 (×4): qty 1

## 2021-03-14 MED ORDER — CHLORHEXIDINE GLUCONATE CLOTH 2 % EX PADS
6.0000 | MEDICATED_PAD | Freq: Every day | CUTANEOUS | Status: DC
  Administered 2021-03-14 – 2021-03-17 (×4): 6 via TOPICAL

## 2021-03-14 MED ORDER — CHLORHEXIDINE GLUCONATE 0.12% ORAL RINSE (MEDLINE KIT)
15.0000 mL | Freq: Two times a day (BID) | OROMUCOSAL | Status: DC
  Administered 2021-03-14 – 2021-03-18 (×8): 15 mL via OROMUCOSAL

## 2021-03-14 MED ORDER — ROCURONIUM BROMIDE 10 MG/ML (PF) SYRINGE: 30.0000 mg | PREFILLED_SYRINGE | Freq: Once | INTRAVENOUS | Status: AC

## 2021-03-14 MED ORDER — LORAZEPAM 2 MG/ML IJ SOLN
2.0000 mg | Freq: Four times a day (QID) | INTRAMUSCULAR | Status: DC | PRN
  Administered 2021-03-14: 2 mg via INTRAVENOUS
  Filled 2021-03-14: qty 1

## 2021-03-14 MED ORDER — ORAL CARE MOUTH RINSE
15.0000 mL | OROMUCOSAL | Status: DC
  Administered 2021-03-14 – 2021-03-18 (×37): 15 mL via OROMUCOSAL

## 2021-03-14 MED ORDER — DEXMEDETOMIDINE HCL IN NACL 400 MCG/100ML IV SOLN
0.4000 ug/kg/h | INTRAVENOUS | Status: DC
Start: 2021-03-14 — End: 2021-03-16
  Administered 2021-03-14 – 2021-03-15 (×2): 0.4 ug/kg/h via INTRAVENOUS
  Filled 2021-03-14 (×2): qty 100

## 2021-03-14 MED ORDER — ROCURONIUM BROMIDE 10 MG/ML (PF) SYRINGE
PREFILLED_SYRINGE | INTRAVENOUS | Status: AC
  Administered 2021-03-14: 30 mg via INTRAVENOUS
  Filled 2021-03-14: qty 10

## 2021-03-14 NOTE — Progress Notes (Addendum)
Subjective: Had subclinical seizures overnight.  Sedation was weaned off this morning and patient able to open his eyes but not following commands.  ROS: Unable to obtain due to poor mental status  Examination  Vital signs in last 24 hours: Temp:  [98.2 F (36.8 C)-99.7 F (37.6 C)] 98.2 F (36.8 C) (07/18 1200) Pulse Rate:  [93-130] 93 (07/18 1141) Resp:  [14-32] 18 (07/18 1141) BP: (70-191)/(52-121) 128/64 (07/18 1141) SpO2:  [89 %-100 %] 100 % (07/18 1141) Arterial Line BP: (140-218)/(59-114) 144/69 (07/18 0600) FiO2 (%):  [40 %-100 %] 40 % (07/18 1141) Weight:  [63.5 kg] 63.5 kg (07/17 2348)  General: lying in bed, not in apparent distress CVS: pulse-normal rate and rhythm RS: Intubated, coarse breath sounds bilaterally Extremities: Left foot amputation, warm Neuro: Opens eyes to repeated verbal stimulation, does not follow commands, left gaze preference, Resists turning head all the way to the right but was able to bring eyes to midline, no apparent facial asymmetry, antigravity strength in bilateral upper extremity and left lower extremity, does not withdraw to noxious stimuli in right lower extremity  Basic Metabolic Panel: Recent Labs  Lab 03/13/21 2229 03/13/21 2233 03/14/21 0002 03/14/21 0127 03/14/21 0316 03/14/21 0617  NA 137 136 135  --  132* 134*  K 3.8 4.5 4.1  --  3.5 3.5  CL 95* 106  --   --   --  95*  CO2 9*  --   --   --   --  21*  GLUCOSE 179* 172*  --   --   --  283*  BUN 16 15  --   --   --  12  CREATININE 1.81* 1.40*  --  1.27*  --  1.63*  CALCIUM 10.2  --   --   --   --  8.6*  MG 3.9*  --   --   --   --  2.7*  PHOS 11.4*  --   --   --   --  6.6*    CBC: Recent Labs  Lab 03/13/21 2229 03/13/21 2233 03/14/21 0002 03/14/21 0127 03/14/21 0316 03/14/21 0617  WBC 15.4*  --   --  8.5  --  6.9  HGB 11.6* 15.0 12.2* 11.1* 11.9* 10.3*  HCT 40.0 44.0 36.0* 33.5* 35.0* 32.3*  MCV 100.8*  --   --  87.9  --  89.0  PLT 228  --   --  168  --  170      Coagulation Studies: No results for input(s): LABPROT, INR in the last 72 hours.  Imaging CT head without contrast 03/13/2021: No acute intracranial process. Interval resolution of the left-sided subdural hematoma seen previously, with mild residual dural thickening along the left frontal convexity.   ASSESSMENT AND PLAN: 65 year old male with prior left subdural hematoma presented with seizures.  Epilepsy with breakthrough seizures Left subdural hematoma Acute encephalopathy, improving Leukocytosis (resolved) Anemia AKI Transaminitis Elevated bilirubin -Unclear etiology of breakthrough seizures: Keppra level pending.  Leukocytosis is likely due to seizure which is already improving.  Afebrile so low suspicion for infection.  Recommendations -LTM EEG showed intermittent seizures overnight.  Therefore we will increase Keppra to 1500 mg twice daily -Propofol was stopped by ICU team this morning.  Agree to hold sedation unless EEG shows frequent seizures -If intermittent seizures, can increase Keppra to 2000 mg twice daily.  If seizures persist, can add Vimpat.  However if seizures are frequent, would recommend resuming propofol. -Continue LTM EEG  till patient is seizure-free for about 24 hours -Continue seizure precautions -PRN IV Ativan 2 mg for clinical seizure-like activity -Management of rest of comorbidities per primary team   I have spent a total of  35 minutes with the patient reviewing hospital notes,  test results, labs and examining the patient as well as establishing an assessment and plan that was discussed personally with the ICU team, RN.  > 50% of time was spent in direct patient care.    Zeb Comfort Epilepsy [facility] For questions after 5pm please refer to AMION to reach the Neurologist on call

## 2021-03-14 NOTE — Progress Notes (Signed)
RT note: RT and RN transported vent patient from ED to 4N28. Vital signs stable through out.

## 2021-03-14 NOTE — Progress Notes (Signed)
NAME:  Seth [name], MRN:  [id number], DOB:  09-Dec-1955, LOS: 1 ADMISSION DATE:  03/13/2021, CONSULTATION DATE: 03/13/2021 REFERRING MD: [name], ED, CHIEF COMPLAINT: Seizure, ventricular tachycardia  History of Present Illness:  Patient is a 65 year old male admitted near the end of May with subdural hematoma.  This was managed nonsurgically.  Of note the patient did have a limited amount of ventricular tachycardia at that time.  Patient also has a history of CVA, essential hypertension, alcohol abuse, peripheral vascular disease status post DES to the popliteal artery, history of left foot osteomyelitis, COPD, tobacco abuse disorder, and depression.  He was discharged on Keppra.  Patient is admitted this evening after 3 witnessed seizures at home found to be unresponsive by EMS.  Family apparently last heard from the patient earlier this morning.  He lives alone.  He was down for an unclear period of time.  Patient was urgently intubated in the emergency room.  Prior to intubation the patient had a brief 10 beat run of V. tach per nursing.  He is being started on amiodarone.    Chest x-ray is clear. Creatinine this evening is 1.45 up from 1 in May. CT scan of the head here reveals resolution of the subdural hematoma.  Anion gap is 33 with a bicarb of 9.  She has received an amp of bicarbonate.  pH initially post intubation was 7.120 with a PCO2 of 43 PO2 of 302 acid base deficit of 15.  Pertinent  Medical History  Intubated admitted 03/13/2020  Significant Hospital Events: Including procedures, antibiotic start and stop dates in addition to other pertinent events   As above  Interim History / Subjective:  No further ectopy this AM. Remains on amio 30  Objective   Blood pressure 129/84, pulse (!) 104, temperature 98.6 F (37 C), temperature source Axillary, resp. rate 18, height 6\' 3"  (1.905 m), weight 63.5 kg, SpO2 100 %.    Vent Mode: PRVC FiO2 (%):  [70 %-100 %] 70 % Set Rate:  [14  bmp-18 bmp] 18 bmp Vt Set:  [650 mL] 650 mL PEEP:  [5 cmH20] 5 cmH20 Plateau Pressure:  [13 cmH20-15 cmH20] 15 cmH20   Intake/Output Summary (Last 24 hours) at 03/14/2021 0813 Last data filed at 03/14/2021 6761 Gross per 24 hour  Intake 2029.86 ml  Output --  Net 2029.86 ml    Filed Weights   03/13/21 2348  Weight: 63.5 kg    Examination: General: Thin sedated intubated black male, in NAD. HENT: [place]/AT, MM moist.  ETT in place Lungs: Coarse bilaterally.  Thick secretions suctioned from ETT. Cardiovascular: RRR, no M/R/G. Abdomen: BS x 4, S/NT/ND. Extremities: Left foot TMA. Neuro: Sedated ventilated.  Resolved Hospital Problem list   NA  Assessment & Plan:   Recurrent seizure in setting recent SDH May 2022 (non surgically managed). - Neurology following. - Continue Keppra and follow EEG.  Brief run of ventricular tachycardia - unclear etiology, cards feels possibly due to extremis 2/2 above. - Continue Amio for now. - Cards following.  HTN. - Continue PRN Hydralazine for SBP > 170.  AKI. Anion gap acidosis - improved. - D/c bicarb gtt. - Follow BMP.  Respiratory failure - 2/2 above. - Continue full vent support. - Wean this AM and as long as no further ectopy and / or seizure activity then can consider extubation. - Bronchial hygiene. - Follow CXR.  Increased secretions - remains afebrile with normal WBC and normal CXR. - Frequent suctioning and  routine bronchial hygiene.  Hyperglycemia. - SSI. - Check Hgb A1c.  Best Practice (right click and "Reselect all SmartList Selections" daily)   Diet/type: NPO DVT prophylaxis: LMWH GI prophylaxis: H2B Lines: N/A Foley:  N/A Code Status:  full code Last date of multidisciplinary goals of care discussion [family unavailable]  CC time: 35 min.   Montey Hora, [facility] For pager details, please see AMION or use Epic chat  After 1900, please call [place] for cross  coverage needs 03/14/2021, 8:25 AM

## 2021-03-14 NOTE — Progress Notes (Signed)
STAT LTM started; no initial skin breakdown was seen. [facility] to monitor, tested event button; Dr Hortense Ramal notified.

## 2021-03-14 NOTE — Progress Notes (Signed)
*  PRELIMINARY RESULTS* Echocardiogram 2D Echocardiogram has been performed.  Luisa Hart RDCS 03/14/2021, 8:36 AM

## 2021-03-14 NOTE — Progress Notes (Signed)
Progress Note  Patient Name: Seth [name] Date of Encounter: 03/14/2021  Primary Cardiologist: None   Subjective   Patient seen earlier today by fellow.  Discussed care with bedside nurse Joelene Millin.   Intubated. Propofol resumed due to significant HTN felt likely 2/2 to withdrawal, therefore currently sedated.  Inpatient Medications    Scheduled Meds:  chlorhexidine gluconate (MEDLINE KIT)  15 mL Mouth Rinse BID   Chlorhexidine Gluconate Cloth  6 each Topical Daily   enoxaparin (LOVENOX) injection  40 mg Subcutaneous Q24H   insulin aspart  0-15 Units Subcutaneous Q4H   ipratropium-albuterol  3 mL Nebulization Q6H   mouth rinse  15 mL Mouth Rinse 10 times per day   Continuous Infusions:  sodium chloride     famotidine (PEPCID) IV     levETIRAcetam 1,500 mg (03/14/21 1051)   propofol (DIPRIVAN) infusion 35 mcg/kg/min (03/14/21 0738)   PRN Meds: Place/Maintain arterial line **AND** sodium chloride, acetaminophen, docusate sodium, hydrALAZINE, ondansetron (ZOFRAN) IV, polyethylene glycol   Vital Signs    Vitals:   03/14/21 0819 03/14/21 1047 03/14/21 1141 03/14/21 1200  BP:  (!) 183/68 128/64   Pulse:   93   Resp:   18   Temp:    98.2 F (36.8 C)  TempSrc:    Axillary  SpO2: 100%  100%   Weight:      Height:        Intake/Output Summary (Last 24 hours) at 03/14/2021 1244 Last data filed at 03/14/2021 0639 Gross per 24 hour  Intake 2029.86 ml  Output --  Net 2029.86 ml   Filed Weights   03/13/21 2348  Weight: 63.5 kg    Telemetry    Sinus tach and sinus rhythm. No VT - Personally Reviewed  ECG    ST and PVCs - Personally Reviewed  Physical Exam   GEN: intubated, sedated Neck: No JVD Cardiac: regular rhythm, normal rate, no murmurs, rubs, or gallops.  Respiratory: Clear to auscultation bilaterally. GI: Soft, nontender, non-distended  MS: No edema; No deformity. Neuro:  sedated, cannot assess Psych: sedated, cannot assess  Labs     Chemistry Recent Labs  Lab 03/13/21 2229 03/13/21 2233 03/14/21 0002 03/14/21 0127 03/14/21 0316 03/14/21 0617  NA 137 136 135  --  132* 134*  K 3.8 4.5 4.1  --  3.5 3.5  CL 95* 106  --   --   --  95*  CO2 9*  --   --   --   --  21*  GLUCOSE 179* 172*  --   --   --  283*  BUN 16 15  --   --   --  12  CREATININE 1.81* 1.40*  --  1.27*  --  1.63*  CALCIUM 10.2  --   --   --   --  8.6*  PROT 8.0  --   --   --   --   --   ALBUMIN 4.3  --   --   --   --   --   AST 54*  --   --   --   --   --   ALT 17  --   --   --   --   --   ALKPHOS 95  --   --   --   --   --   BILITOT 1.6*  --   --   --   --   --   GFRNONAA 41*  --   --  >  60  --  47*  ANIONGAP 33*  --   --   --   --  18*     Hematology Recent Labs  Lab 03/13/21 2229 03/13/21 2233 03/14/21 0127 03/14/21 0316 03/14/21 0617  WBC 15.4*  --  8.5  --  6.9  RBC 3.97*  --  3.81*  --  3.63*  HGB 11.6*   < > 11.1* 11.9* 10.3*  HCT 40.0   < > 33.5* 35.0* 32.3*  MCV 100.8*  --  87.9  --  89.0  MCH 29.2  --  29.1  --  28.4  MCHC 29.0*  --  33.1  --  31.9  RDW 22.5*  --  22.0*  --  22.8*  PLT 228  --  168  --  170   < > = values in this interval not displayed.    Cardiac EnzymesNo results for input(s): TROPONINI in the last 168 hours. No results for input(s): TROPIPOC in the last 168 hours.   BNPNo results for input(s): BNP, PROBNP in the last 168 hours.   DDimer No results for input(s): DDIMER in the last 168 hours.   Radiology    CT Head Wo Contrast  Result Date: 03/13/2021 CLINICAL DATA:  Found down, seizure, unresponsive EXAM: CT HEAD WITHOUT CONTRAST TECHNIQUE: Contiguous axial images were obtained from the base of the skull through the vertex without intravenous contrast. COMPARISON:  01/19/2021 FINDINGS: Brain: Interval resolution of the left-sided subdural hematoma seen previously, with mild residual dural thickening along the left frontal convexity. No acute infarct or hemorrhage. Chronic small vessel ischemic  changes throughout the periventricular white matter are stable. Lateral ventricles and midline structures are unremarkable. No acute extra-axial fluid collections. No mass effect. Vascular: No hyperdense vessel or unexpected calcification. Skull: Normal. Negative for fracture or focal lesion. Sinuses/Orbits: Mild mucosal. Polypoid mucosal thickening of the ethmoid and frontal sinuses thickening right maxillary sinus unchanged. Other: None. IMPRESSION: 1. No acute intracranial process. 2. Interval resolution of the left-sided subdural hematoma seen previously, with mild residual dural thickening along the left frontal convexity. Electronically Signed   By: Randa Ngo M.D.   On: 03/13/2021 22:12   DG Chest Port 1 View  Result Date: 03/14/2021 CLINICAL DATA:  Respiratory failure. EXAM: PORTABLE CHEST 1 VIEW COMPARISON:  03/13/2021 FINDINGS: An endotracheal tube terminates below the clavicles, well above the carina. Enteric tubes course into the abdomen. Defibrillator pads overlie the chest. The cardiomediastinal silhouette is within normal limits. There is new mild right basilar airspace opacity as well as a small right pleural effusion. No pulmonary edema or pneumothorax is identified. IMPRESSION: New small right pleural effusion with right basilar atelectasis or infiltrate. Electronically Signed   By: Logan Bores M.D.   On: 03/14/2021 08:41   DG Chest Port 1 View  Result Date: 03/13/2021 CLINICAL DATA:  Intubated EXAM: PORTABLE CHEST 1 VIEW COMPARISON:  01/13/2021 FINDINGS: 2 frontal views of the chest demonstrate endotracheal tube overlying tracheal air column tip at level of thoracic inlet. Enteric catheter passes below diaphragm tip overlying the gastric fundus. External defibrillator pads overlie the left chest. The cardiac silhouette is stable. No acute airspace disease, effusion, or pneumothorax. No acute bony abnormalities. IMPRESSION: 1. No complication after intubation. 2. No acute intrathoracic  process. Electronically Signed   By: Randa Ngo M.D.   On: 03/13/2021 21:50   ECHOCARDIOGRAM LIMITED  Result Date: 03/14/2021    ECHOCARDIOGRAM LIMITED REPORT   Patient Name:   Seth WINNER  [name] Date of Exam: 03/14/2021 Medical Rec #:  [id number]       Height:       75.0 in Accession #:    [id number]      Weight:       140.0 lb Date of Birth:  1956-01-01        BSA:          1.886 m Patient Age:    65 years        BP:           124/65 mmHg Patient Gender: M               HR:           97 bpm. Exam Location:  Inpatient Procedure: Limited Echo, Cardiac Doppler and Color Doppler Indications:    Ventricular tachycardia  History:        Patient has prior history of Echocardiogram examinations, most                 recent 01/07/2021. COPD and Stroke, Arrythmias:Tachycardia;                 Signs/Symptoms:Chest Pain.  Sonographer:    Luisa Hart RDCS Referring Phys: [id number] Shellia Cleverly  Sonographer Comments: No parasternal window, no apical window and echo performed with patient supine and on artificial respirator. IMPRESSIONS  1. Left ventricular ejection fraction, by estimation, is 70 to 75%. The left ventricle has hyperdynamic function.  2. Right ventricular systolic function is normal. The right ventricular size is normal. There is normal pulmonary artery systolic pressure. The estimated right ventricular systolic pressure is 71.0 mmHg.  3. The mitral valve is grossly normal. No evidence of mitral valve regurgitation.  4. The aortic valve is grossly normal. Aortic valve regurgitation is not visualized. No aortic stenosis is present. Aortic valve mean gradient measures 8.0 mmHg. Aortic valve Vmax measures 1.94 m/s. FINDINGS  Left Ventricle: Left ventricular ejection fraction, by estimation, is 70 to 75%. The left ventricle has hyperdynamic function. There is no left ventricular hypertrophy. Right Ventricle: The right ventricular size is normal. Right ventricular systolic function is normal. There is normal  pulmonary artery systolic pressure. The tricuspid regurgitant velocity is 2.05 m/s, and with an assumed right atrial pressure of 3 mmHg,  the estimated right ventricular systolic pressure is 62.6 mmHg. Left Atrium: Left atrial size was normal in size. Right Atrium: Right atrial size was normal in size. Pericardium: There is no evidence of pericardial effusion. Mitral Valve: The mitral valve is grossly normal. Tricuspid Valve: The tricuspid valve is normal in structure. Tricuspid valve regurgitation is mild. Aortic Valve: The aortic valve is grossly normal. Aortic valve regurgitation is not visualized. No aortic stenosis is present. Aortic valve mean gradient measures 8.0 mmHg. Aortic valve peak gradient measures 15.1 mmHg. Pulmonic Valve: The pulmonic valve was not assessed. AORTIC VALVE AV Vmax:      194.00 cm/s AV Vmean:     132.000 cm/s AV VTI:       0.289 m AV Peak Grad: 15.1 mmHg AV Mean Grad: 8.0 mmHg MITRAL VALVE               TRICUSPID VALVE MV Area (PHT): 5.62 cm    TR Peak grad:   16.8 mmHg MV Decel Time: 135 msec    TR Vmax:        205.00 cm/s MV E velocity: 61.30 cm/s MV A velocity: 63.90 cm/s MV E/A ratio:  0.96 Mark  Skains MD Electronically signed by Candee Furbish MD Signature Date/Time: 03/14/2021/10:27:22 AM    Final     Cardiac Studies   Repeat limited echo:    1. Left ventricular ejection fraction, by estimation, is 70 to 75%. The  left ventricle has hyperdynamic function.   2. Right ventricular systolic function is normal. The right ventricular  size is normal. There is normal pulmonary artery systolic pressure. The  estimated right ventricular systolic pressure is 95.0 mmHg.   3. The mitral valve is grossly normal. No evidence of mitral valve  regurgitation.   4. The aortic valve is grossly normal. Aortic valve regurgitation is not  visualized. No aortic stenosis is present. Aortic valve mean gradient  measures 8.0 mmHg. Aortic valve Vmax measures 1.94 m/s.   Patient Profile      65 y.o. male hx of SDH, epilepsy, ?brief VT arrest during SDH admission, CVA, HTN, EtOH abuse, PAD s/p stenting of popliteal artery, left foot osteomyelitis s/p TMA, COPD. who is being seen 03/14/2021 for the evaluation of VT    Assessment & Plan   Active Problems:   AKI (acute kidney injury) ([place])   Nonsustained ventricular tachycardia ([facility])   Encephalopathy acute   Seizure ([place])   Pressure injury of skin   NSVT - remains on amiodarone, no recurrence of VT. Structurally normal heart by echo. Can continue IV amiodarone for now - may consider short term oral amiodarone when taking oral meds and will place 30 day telemetry monitor on dismissal. Likely secondary to medical illness. Keep K >4, Mg >2. I have reviewed scanned in telemetry sheet, no VT on tele monitor.  PACs - less significant on amiodarone.   HTN  - confounded by alcohol withdrawal. Will observe for medical therapy as needed.      For questions or updates, please contact [place] Please consult [email or web address] for contact info under        Signed, Elouise Munroe, MD  03/14/2021, 12:44 PM
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
## 2021-03-14 NOTE — Progress Notes (Signed)
[facility] Progress Note Patient Name: DAICHI MORIS DOB: [birth year] MRN: [id number]   Date of Service  03/14/2021  HPI/Events of Note  Hypertension - BP = 205/104.  eICU Interventions  Plan: Hydralazine 10 mg IV Q 4 hours PRN SBP > 170 or DBP > 100.     Intervention Category Major Interventions: Hypertension - evaluation and management  Jaylan Duggar [place] 03/14/2021, 1:30 AM

## 2021-03-14 NOTE — Procedures (Signed)
Patient Name: Seth [name]  MRN: [id number]  Epilepsy Attending: Lora Havens  Referring Physician/Provider: Dr Kerney Elbe Duration: 03/14/2021 0326 to 03/15/2021 0326  Patient history: 65 year old male presenting with 3 witnessed seizures after being found down by daughter. EEG to evaluate for seizure  Level of alertness:  comatose  AEDs during EEG study: LEV, Propofol  Technical aspects: This EEG study was done with scalp electrodes positioned according to the 10-20 International system of electrode placement. Electrical activity was acquired at a sampling rate of 500Hz  and reviewed with a high frequency filter of 70Hz  and a low frequency filter of 1Hz . EEG data were recorded continuously and digitally stored.   Description: No clear posterior dominant rhythm was seen. EEG showed continuous generalized 3 to 5 Hz theta- delta slowing.  Lateralized periodic discharges with overriding fast activity were noted in left hemisphere at 0.5 Hz which at times appeared rhythmic with evolution in frequency and morphology lasting about 5 to 8 seconds consistent with brief ictal-interictal rhythmic discharges (BIRDs). Intermittent seizures without clinical signs were also noted to be arising from left frontotemporal region, average 1-2 seizures/hour, lasting on average about 10 to 15 seconds. Last seizure was noted on 03/14/2021 at [place] - Seizure without clinical signs, left frontotemporal region - Lateralized periodic discharges with overriding fast activity ( LPD +), left hemisphere - Brief ictal-interictal rhythmic discharges, left hemisphere - Continuous slow, generalized  IMPRESSION: This study showed seizures without clinical signs arising from left frontotemporal region, average 1-2 seizures per hour, lasting for about 10 to 15 seconds each.  Additionally lateralized periodic discharges with overriding fast activity and brief ictal-interictal rhythmic discharges were noted which  are on the ictal-interictal continuum with high potential for seizure recurrence.  There is also moderate to severe diffuse encephalopathy, nonspecific to etiology but could be secondary to seizures.  Seth [name]

## 2021-03-14 NOTE — Progress Notes (Addendum)
[facility] COVERAGE CRITICAL CARE PROGRESS NOTE  CTSP re: EEG monitoring showing too much artifact.  Dr. Cheral Marker at bedside.  Patient is currently intubated and on sedation with propofol.  Will give rocuronium 30 mg IV single dose. Case discussed with Dr. Cheral Marker at bedside.  Renee Pain, MD Board Certified by the ABIM, [place]

## 2021-03-15 DIAGNOSIS — G9340 Encephalopathy, unspecified: Secondary | ICD-10-CM | POA: Diagnosis not present

## 2021-03-15 DIAGNOSIS — I472 Ventricular tachycardia: Secondary | ICD-10-CM | POA: Diagnosis not present

## 2021-03-15 DIAGNOSIS — N179 Acute kidney failure, unspecified: Secondary | ICD-10-CM | POA: Diagnosis not present

## 2021-03-15 DIAGNOSIS — R569 Unspecified convulsions: Secondary | ICD-10-CM | POA: Diagnosis not present

## 2021-03-15 LAB — GLUCOSE, CAPILLARY
Glucose-Capillary: 112 mg/dL — ABNORMAL HIGH (ref 70–99)
Glucose-Capillary: 110 mg/dL — ABNORMAL HIGH (ref 70–99)
Glucose-Capillary: 109 mg/dL — ABNORMAL HIGH (ref 70–99)
Glucose-Capillary: 100 mg/dL — ABNORMAL HIGH (ref 70–99)
Glucose-Capillary: 87 mg/dL (ref 70–99)

## 2021-03-15 LAB — BASIC METABOLIC PANEL
Anion gap: 9 (ref 5–15)
BUN: 15 mg/dL (ref 8–23)
CO2: 26 mmol/L (ref 22–32)
Calcium: 8.6 mg/dL — ABNORMAL LOW (ref 8.9–10.3)
Chloride: 101 mmol/L (ref 98–111)
Creatinine, Ser: 1.59 mg/dL — ABNORMAL HIGH (ref 0.61–1.24)
GFR, Estimated: 48 mL/min — ABNORMAL LOW (ref >60)
Glucose, Bld: 119 mg/dL — ABNORMAL HIGH (ref 70–99)
Potassium: 3.4 mmol/L — ABNORMAL LOW (ref 3.5–5.1)
Sodium: 136 mmol/L (ref 135–145)

## 2021-03-15 LAB — CBC
HCT: 26.3 % — ABNORMAL LOW (ref 39.0–52.0)
Hemoglobin: 8.7 g/dL — ABNORMAL LOW (ref 13.0–17.0)
MCH: 29.2 pg (ref 26.0–34.0)
MCHC: 33.1 g/dL (ref 30.0–36.0)
MCV: 88.3 fL (ref 80.0–100.0)
Platelets: 130 10*3/uL — ABNORMAL LOW (ref 150–400)
RBC: 2.98 MIL/uL — ABNORMAL LOW (ref 4.22–5.81)
RDW: 24.1 % — ABNORMAL HIGH (ref 11.5–15.5)
WBC: 7.6 10*3/uL (ref 4.0–10.5)
nRBC: 3.7 % — ABNORMAL HIGH (ref 0.0–0.2)

## 2021-03-15 LAB — PHOSPHORUS: Phosphorus: 3.5 mg/dL (ref 2.5–4.6)

## 2021-03-15 LAB — PATHOLOGIST SMEAR REVIEW: Path Review: INCREASED

## 2021-03-15 LAB — MAGNESIUM: Magnesium: 2.3 mg/dL (ref 1.7–2.4)

## 2021-03-15 MED ORDER — THIAMINE HCL 100 MG/ML IJ SOLN
100.0000 mg | Freq: Every day | INTRAMUSCULAR | Status: DC
  Administered 2021-03-15 – 2021-03-17 (×3): 100 mg via INTRAVENOUS
  Filled 2021-03-15 (×3): qty 2

## 2021-03-15 MED ORDER — POTASSIUM CHLORIDE CRYS ER 20 MEQ PO TBCR: 40.0000 meq | EXTENDED_RELEASE_TABLET | Freq: Once | ORAL | Status: DC

## 2021-03-15 MED ORDER — SODIUM CHLORIDE 0.9 % IV BOLUS
1000.0000 mL | Freq: Once | INTRAVENOUS | Status: AC
  Administered 2021-03-15: 1000 mL via INTRAVENOUS

## 2021-03-15 MED ORDER — AMIODARONE HCL 200 MG PO TABS
200.0000 mg | ORAL_TABLET | Freq: Two times a day (BID) | ORAL | Status: DC
  Administered 2021-03-15 – 2021-03-16 (×3): 200 mg
  Filled 2021-03-15 (×3): qty 1

## 2021-03-15 MED ORDER — IPRATROPIUM-ALBUTEROL 0.5-2.5 (3) MG/3ML IN SOLN: 3.0000 mL | Freq: Four times a day (QID) | RESPIRATORY_TRACT | Status: DC | PRN

## 2021-03-15 MED ORDER — FOLIC ACID 5 MG/ML IJ SOLN
1.0000 mg | Freq: Every day | INTRAMUSCULAR | Status: DC
  Administered 2021-03-15 – 2021-03-16 (×2): 1 mg via INTRAVENOUS
  Filled 2021-03-15 (×3): qty 0.2

## 2021-03-15 MED ORDER — ENSURE ENLIVE PO LIQD
237.0000 mL | Freq: Two times a day (BID) | ORAL | Status: DC
  Administered 2021-03-15 – 2021-03-22 (×13): 237 mL via ORAL

## 2021-03-15 MED ORDER — POTASSIUM CHLORIDE 20 MEQ PO PACK
40.0000 meq | PACK | Freq: Once | ORAL | Status: AC
  Administered 2021-03-15: 40 meq
  Filled 2021-03-15: qty 2

## 2021-03-15 NOTE — Progress Notes (Signed)
NAME:  Seth [name], MRN:  [id number], DOB:  06/13/1956, LOS: 2 ADMISSION DATE:  03/13/2021, CONSULTATION DATE: 03/13/2021 REFERRING MD: [name], ED, CHIEF COMPLAINT: Seizure, ventricular tachycardia  History of Present Illness:  Patient is a 65 year old male admitted near the end of May with subdural hematoma.  This was managed nonsurgically.  Of note the patient did have a limited amount of ventricular tachycardia at that time.  Patient also has a history of CVA, essential hypertension, alcohol abuse, peripheral vascular disease status post DES to the popliteal artery, history of left foot osteomyelitis, COPD, tobacco abuse disorder, and depression.  He was discharged on Keppra.  Patient is admitted this evening after 3 witnessed seizures at home found to be unresponsive by EMS.  Family apparently last heard from the patient earlier this morning.  He lives alone.  He was down for an unclear period of time.  Patient was urgently intubated in the emergency room.  Prior to intubation the patient had a brief 10 beat run of V. tach per nursing.  He is being started on amiodarone.    Chest x-ray is clear. Creatinine this evening is 1.45 up from 1 in May. CT scan of the head here reveals resolution of the subdural hematoma.  Anion gap is 33 with a bicarb of 9.  She has received an amp of bicarbonate.  pH initially post intubation was 7.120 with a PCO2 of 43 PO2 of 302 acid base deficit of 15.  Pertinent  Medical History  Intubated admitted 03/13/2020  Significant Hospital Events: Including procedures, antibiotic start and stop dates in addition to other pertinent events   As above 7/18 subclinical seizures overnight, keppra increased 7/19 attempted SBT but low MV due to decreased effort / rate  Interim History / Subjective:  No current obvious seizures but LTM showed seizures yesterday per Dr. Hortense Ramal - Keppra increased. Attempted PSV wean this AM but low rate so flipped back to full support.   Precedex weaned further and planning to re-attempt shortly. Secretions improved this AM.  Objective   Blood pressure 120/77, pulse 79, temperature 98.6 F (37 C), temperature source Axillary, resp. rate 18, height 6\' 3"  (1.905 m), weight 63.5 kg, SpO2 100 %.    Vent Mode: PRVC FiO2 (%):  [30 %-40 %] 30 % Set Rate:  [18 bmp] 18 bmp Vt Set:  [650 mL] 650 mL PEEP:  [5 cmH20] 5 cmH20 Plateau Pressure:  [13 cmH20-15 cmH20] 14 cmH20   Intake/Output Summary (Last 24 hours) at 03/15/2021 0829 Last data filed at 03/15/2021 0800 Gross per 24 hour  Intake 598.77 ml  Output 375 ml  Net 223.77 ml    Filed Weights   03/13/21 2348  Weight: 63.5 kg    Examination: General: Thin sedated intubated black male, in NAD. HENT: [place]/AT.  ETT in place Lungs: CTAB. Cardiovascular: RRR, no M/R/G. Abdomen: BS x 4, S/NT/ND. Extremities: Left foot TMA. Neuro: Sedated minimally but does follow some basic commands intermittently.  Resolved Hospital Problem list   NA  Assessment & Plan:   Recurrent seizure in setting recent SDH May 2022 (non surgically managed). - Neurology following. - Continue Keppra and follow EEG, EEG to d/c once seizure free for roughly 24 hours.  Brief run of ventricular tachycardia - unclear etiology, cards feels possibly due to extremis 2/2 above. - Amio orders ended yesterday but per cards, to continue.  Will start 200mg  BID per tube for now and hopefully transition to PO if he is  extubated later today.  Cards to please weigh in on dosage adjustment and time frame to continue. - 30 day tele monitor upon discharge per cards. - Cards following.  HTN. - Continue PRN Hydralazine for SBP > 170.  AKI - improving. - Follow BMP.  Respiratory failure - 2/2 above. - Failed PSV wean earlier due to low effort, will re-try later this AM after sedation weaned further. - Bronchial hygiene. - Follow CXR.  Hyperglycemia. - SSI. - Check Hgb A1c.  Anemia - no signs of bleeding. -  Transfuse for Hgb < 7.  EtOH use. - Thiamine / Folate.  Best Practice (right click and "Reselect all SmartList Selections" daily)   Diet/type: NPO DVT prophylaxis: LMWH GI prophylaxis: H2B Lines: N/A Foley:  N/A Code Status:  full code Last date of multidisciplinary goals of care discussion [family unavailable]  CC time: 30 min.   Montey Hora, [facility] For pager details, please see AMION or use Epic chat  After 1900, please call [facility] for cross coverage needs 03/15/2021, 8:29 AM

## 2021-03-15 NOTE — Progress Notes (Signed)
Initial Nutrition Assessment  DOCUMENTATION CODES:   Underweight, Severe malnutrition in context of chronic illness  INTERVENTION:   Magic cup TID with meals, each supplement provides 290 kcal and 9 grams of protein  Ensure Enlive po BID, each supplement provides 350 kcal and 20 grams of protein  Encourage PO intake    NUTRITION DIAGNOSIS:   Severe Malnutrition related to chronic illness (PVD, COPD, ETOH abuse) as evidenced by severe fat depletion, severe muscle depletion.  GOAL:   Patient will meet greater than or equal to 90% of their needs  MONITOR:   PO intake, Supplement acceptance  REASON FOR ASSESSMENT:   Ventilator    ASSESSMENT:   Pt with PMH of CVA, HTN, heavy alcohol abuse, PVD, L foot osteomyelitis, COPD, tobacco abuse, depression and recent SDH 12/2020 admitted after being found down with seizures.   Pt discussed during ICU rounds and with RN. Pt unable to answer any questions. When asked pt states he eats at home. Per RN daughter has reported that pt drinks all day and does not eat. He lives alone and she will bring him food.  Neurology following for seizures. Off EEG.   7/19 extubated; diet started    Medications reviewed and include: folic acid, SSI, thiamine  Precedex Keppra Labs reviewed: K+ 3.4 CBG's: 109-112    NUTRITION - FOCUSED PHYSICAL EXAM:  Flowsheet Row Most Recent Value  Orbital Region Severe depletion  Upper Arm Region Severe depletion  Thoracic and Lumbar Region Severe depletion  Buccal Region Moderate depletion  Temple Region Moderate depletion  Clavicle Bone Region Severe depletion  Clavicle and Acromion Bone Region Severe depletion  Scapular Bone Region Severe depletion  Dorsal Hand Unable to assess  Patellar Region Severe depletion  Anterior Thigh Region Severe depletion  Posterior Calf Region Severe depletion  Hair Reviewed  Eyes Reviewed  Mouth Reviewed  [Limited but did stick out tongue: pale tongue]  Skin Reviewed   Nails Unable to assess       Diet Order:   Diet Order             Diet Carb Modified Fluid consistency: Thin; Room service appropriate? Yes  Diet effective now                   EDUCATION NEEDS:   Not appropriate for education at this time  Skin:  Skin Assessment:  (stage I: sacrum)  Last BM:  7/18  Height:   Ht Readings from Last 1 Encounters:  03/13/21 6\' 3"  (1.905 m)    Weight:   Wt Readings from Last 1 Encounters:  03/13/21 63.5 kg     BMI:  Body mass index is 17.5 kg/m.  Estimated Nutritional Needs:   Kcal:  2100-2300  Protein:  105-120 grams  Fluid:  > 2 L/day  Lockie Pares., RD, LDN, CNSC See AMiON for contact information

## 2021-03-15 NOTE — Procedures (Addendum)
Patient Name: Seth [name]  MRN: [id number]  Epilepsy Attending: Lora Havens  Referring Physician/Provider: Dr Kerney Elbe Duration: 03/15/2021 0326 to 03/15/2021 1021   Patient history: 65 year old male presenting with 3 witnessed seizures after being found down by daughter. EEG to evaluate for seizure   Level of alertness:  lethargic  AEDs during EEG study: LEV   Technical aspects: This EEG study was done with scalp electrodes positioned according to the 10-20 International system of electrode placement. Electrical activity was acquired at a sampling rate of 500Hz  and reviewed with a high frequency filter of 70Hz  and a low frequency filter of 1Hz . EEG data were recorded continuously and digitally stored.   Description: No clear posterior dominant rhythm was seen. EEG showed continuous generalized 3 to 5 Hz theta- delta slowing.  Lateralized periodic discharges were noted in left hemisphere at 0.5-1 Hz.   ABNORMALITY - Lateralized periodic discharges with overriding fast activity ( LPD), left hemisphere - Continuous slow, generalized   IMPRESSION: This study showed lateralized periodic discharges which are on the ictal-interictal continuum with high potential for seizure recurrence.  There is also moderate to severe diffuse encephalopathy, nonspecific to etiology but likely secondary to sedation, postictal state.  No definite seizures were seen during the study.  EEG appears to be improving compared to previous day.   Navid Lenzen [name]

## 2021-03-15 NOTE — Progress Notes (Signed)
LTM EEG discontinued - no skin breakdown at unhook.   

## 2021-03-15 NOTE — Progress Notes (Signed)
Progress Note  Patient Name: Seth [name] Date of Encounter: 03/15/2021  Primary Cardiologist: None   Subjective   Resting, does not readily respond to questions. No distress. Extubated.   Inpatient Medications    Scheduled Meds:  amiodarone  200 mg Per Tube BID   chlorhexidine gluconate (MEDLINE KIT)  15 mL Mouth Rinse BID   Chlorhexidine Gluconate Cloth  6 each Topical Daily   enoxaparin (LOVENOX) injection  40 mg Subcutaneous W46K   folic acid  1 mg Intravenous Daily   insulin aspart  0-15 Units Subcutaneous Q4H   ipratropium-albuterol  3 mL Nebulization Q6H   mouth rinse  15 mL Mouth Rinse 10 times per day   thiamine  100 mg Intravenous Daily   Continuous Infusions:  sodium chloride     dexmedetomidine (PRECEDEX) IV infusion 0.1 mcg/kg/hr (03/15/21 0800)   famotidine (PEPCID) IV 20 mg (03/15/21 1029)   levETIRAcetam 1,500 mg (03/15/21 1001)   PRN Meds: Place/Maintain arterial line **AND** sodium chloride, acetaminophen, docusate sodium, hydrALAZINE, LORazepam, ondansetron (ZOFRAN) IV, polyethylene glycol   Vital Signs    Vitals:   03/15/21 0600 03/15/21 0800 03/15/21 0812 03/15/21 0946  BP: 102/69 120/77 120/77   Pulse: 76 78 79 86  Resp: '18 18 18 18  ' Temp:  98.6 F (37 C)    TempSrc:  Axillary    SpO2: 100% 100% 100% 97%  Weight:      Height:        Intake/Output Summary (Last 24 hours) at 03/15/2021 1140 Last data filed at 03/15/2021 0800 Gross per 24 hour  Intake 480.07 ml  Output 375 ml  Net 105.07 ml   Filed Weights   03/13/21 2348  Weight: 63.5 kg    Telemetry    SR. No VT - Personally Reviewed  ECG    ST and PVCs - Personally Reviewed  Physical Exam   GEN: No acute distress.   Neck: No JVD Cardiac: RRR, no murmurs, rubs, or gallops.  Respiratory: Clear to auscultation bilaterally. GI: Soft, nontender, non-distended  MS: No edema; No deformity. Neuro:  cannot assess due to patient cooperation  Psych: resting, not responding to  questions readily.   Labs    Chemistry Recent Labs  Lab 03/13/21 2229 03/13/21 2233 03/14/21 0002 03/14/21 0127 03/14/21 0316 03/14/21 0617 03/15/21 0527  NA 137 136   < >  --  132* 134* 136  K 3.8 4.5   < >  --  3.5 3.5 3.4*  CL 95* 106  --   --   --  95* 101  CO2 9*  --   --   --   --  21* 26  GLUCOSE 179* 172*  --   --   --  283* 119*  BUN 16 15  --   --   --  12 15  CREATININE 1.81* 1.40*  --  1.27*  --  1.63* 1.59*  CALCIUM 10.2  --   --   --   --  8.6* 8.6*  PROT 8.0  --   --   --   --   --   --   ALBUMIN 4.3  --   --   --   --   --   --   AST 54*  --   --   --   --   --   --   ALT 17  --   --   --   --   --   --  ALKPHOS 95  --   --   --   --   --   --   BILITOT 1.6*  --   --   --   --   --   --   GFRNONAA 41*  --   --  >60  --  47* 48*  ANIONGAP 33*  --   --   --   --  18* 9   < > = values in this interval not displayed.     Hematology Recent Labs  Lab 03/14/21 0127 03/14/21 0316 03/14/21 0617 03/15/21 0527  WBC 8.5  --  6.9 7.6  RBC 3.81*  --  3.63* 2.98*  HGB 11.1* 11.9* 10.3* 8.7*  HCT 33.5* 35.0* 32.3* 26.3*  MCV 87.9  --  89.0 88.3  MCH 29.1  --  28.4 29.2  MCHC 33.1  --  31.9 33.1  RDW 22.0*  --  22.8* 24.1*  PLT 168  --  170 130*    Cardiac EnzymesNo results for input(s): TROPONINI in the last 168 hours. No results for input(s): TROPIPOC in the last 168 hours.   BNPNo results for input(s): BNP, PROBNP in the last 168 hours.   DDimer No results for input(s): DDIMER in the last 168 hours.   Radiology    CT Head Wo Contrast  Result Date: 03/13/2021 CLINICAL DATA:  Found down, seizure, unresponsive EXAM: CT HEAD WITHOUT CONTRAST TECHNIQUE: Contiguous axial images were obtained from the base of the skull through the vertex without intravenous contrast. COMPARISON:  01/19/2021 FINDINGS: Brain: Interval resolution of the left-sided subdural hematoma seen previously, with mild residual dural thickening along the left frontal convexity. No acute  infarct or hemorrhage. Chronic small vessel ischemic changes throughout the periventricular white matter are stable. Lateral ventricles and midline structures are unremarkable. No acute extra-axial fluid collections. No mass effect. Vascular: No hyperdense vessel or unexpected calcification. Skull: Normal. Negative for fracture or focal lesion. Sinuses/Orbits: Mild mucosal. Polypoid mucosal thickening of the ethmoid and frontal sinuses thickening right maxillary sinus unchanged. Other: None. IMPRESSION: 1. No acute intracranial process. 2. Interval resolution of the left-sided subdural hematoma seen previously, with mild residual dural thickening along the left frontal convexity. Electronically Signed   By: Randa Ngo M.D.   On: 03/13/2021 22:12   DG Chest Port 1 View  Result Date: 03/14/2021 CLINICAL DATA:  Respiratory failure. EXAM: PORTABLE CHEST 1 VIEW COMPARISON:  03/13/2021 FINDINGS: An endotracheal tube terminates below the clavicles, well above the carina. Enteric tubes course into the abdomen. Defibrillator pads overlie the chest. The cardiomediastinal silhouette is within normal limits. There is new mild right basilar airspace opacity as well as a small right pleural effusion. No pulmonary edema or pneumothorax is identified. IMPRESSION: New small right pleural effusion with right basilar atelectasis or infiltrate. Electronically Signed   By: Logan Bores M.D.   On: 03/14/2021 08:41   DG Chest Port 1 View  Result Date: 03/13/2021 CLINICAL DATA:  Intubated EXAM: PORTABLE CHEST 1 VIEW COMPARISON:  01/13/2021 FINDINGS: 2 frontal views of the chest demonstrate endotracheal tube overlying tracheal air column tip at level of thoracic inlet. Enteric catheter passes below diaphragm tip overlying the gastric fundus. External defibrillator pads overlie the left chest. The cardiac silhouette is stable. No acute airspace disease, effusion, or pneumothorax. No acute bony abnormalities. IMPRESSION: 1. No  complication after intubation. 2. No acute intrathoracic process. Electronically Signed   By: Randa Ngo M.D.   On: 03/13/2021 21:50  Overnight EEG with video  Result Date: 03/14/2021 Lora Havens, MD     03/15/2021 10:12 AM Patient Name: Seth [name] MRN: [id number] Epilepsy Attending: Lora Havens Referring Physician/Provider: Dr Kerney Elbe Duration: 03/14/2021 0326 to 03/15/2021 0326 Patient history: 65 year old male presenting with 3 witnessed seizures after being found down by daughter. EEG to evaluate for seizure Level of alertness:  comatose AEDs during EEG study: LEV, Propofol Technical aspects: This EEG study was done with scalp electrodes positioned according to the 10-20 International system of electrode placement. Electrical activity was acquired at a sampling rate of '500Hz'  and reviewed with a high frequency filter of '70Hz'  and a low frequency filter of '1Hz' . EEG data were recorded continuously and digitally stored. Description: No clear posterior dominant rhythm was seen. EEG showed continuous generalized 3 to 5 Hz theta- delta slowing.  Lateralized periodic discharges with overriding fast activity were noted in left hemisphere at 0.5 Hz which at times appeared rhythmic with evolution in frequency and morphology lasting about 5 to 8 seconds consistent with brief ictal-interictal rhythmic discharges (BIRDs). Intermittent seizures without clinical signs were also noted to be arising from left frontotemporal region, average 1-2 seizures/hour, lasting on average about 10 to 15 seconds. Last seizure was noted on 03/14/2021 at [place] - Seizure without clinical signs, left frontotemporal region - Lateralized periodic discharges with overriding fast activity ( LPD +), left hemisphere - Brief ictal-interictal rhythmic discharges, left hemisphere - Continuous slow, generalized IMPRESSION: This study showed seizures without clinical signs arising from left frontotemporal region, average  1-2 seizures per hour, lasting for about 10 to 15 seconds each.  Additionally lateralized periodic discharges with overriding fast activity and brief ictal-interictal rhythmic discharges were noted which are on the ictal-interictal continuum with high potential for seizure recurrence.  There is also moderate to severe diffuse encephalopathy, nonspecific to etiology but could be secondary to seizures. Lora Havens   ECHOCARDIOGRAM LIMITED  Result Date: 03/14/2021    ECHOCARDIOGRAM LIMITED REPORT   Patient Name:   Seth [name] Date of Exam: 03/14/2021 Medical Rec #:  [id number]       Height:       75.0 in Accession #:    [id number]      Weight:       140.0 lb Date of Birth:  1955-11-17        BSA:          1.886 m Patient Age:    89 years        BP:           124/65 mmHg Patient Gender: M               HR:           97 bpm. Exam Location:  Inpatient Procedure: Limited Echo, Cardiac Doppler and Color Doppler Indications:    Ventricular tachycardia  History:        Patient has prior history of Echocardiogram examinations, most                 recent 01/07/2021. COPD and Stroke, Arrythmias:Tachycardia;                 Signs/Symptoms:Chest Pain.  Sonographer:    Luisa Hart RDCS Referring Phys: [id number] Shellia Cleverly  Sonographer Comments: No parasternal window, no apical window and echo performed with patient supine and on artificial respirator. IMPRESSIONS  1. Left ventricular ejection fraction, by estimation, is 70 to 75%.  The left ventricle has hyperdynamic function.  2. Right ventricular systolic function is normal. The right ventricular size is normal. There is normal pulmonary artery systolic pressure. The estimated right ventricular systolic pressure is 10.6 mmHg.  3. The mitral valve is grossly normal. No evidence of mitral valve regurgitation.  4. The aortic valve is grossly normal. Aortic valve regurgitation is not visualized. No aortic stenosis is present. Aortic valve mean gradient measures 8.0 mmHg.  Aortic valve Vmax measures 1.94 m/s. FINDINGS  Left Ventricle: Left ventricular ejection fraction, by estimation, is 70 to 75%. The left ventricle has hyperdynamic function. There is no left ventricular hypertrophy. Right Ventricle: The right ventricular size is normal. Right ventricular systolic function is normal. There is normal pulmonary artery systolic pressure. The tricuspid regurgitant velocity is 2.05 m/s, and with an assumed right atrial pressure of 3 mmHg,  the estimated right ventricular systolic pressure is 26.9 mmHg. Left Atrium: Left atrial size was normal in size. Right Atrium: Right atrial size was normal in size. Pericardium: There is no evidence of pericardial effusion. Mitral Valve: The mitral valve is grossly normal. Tricuspid Valve: The tricuspid valve is normal in structure. Tricuspid valve regurgitation is mild. Aortic Valve: The aortic valve is grossly normal. Aortic valve regurgitation is not visualized. No aortic stenosis is present. Aortic valve mean gradient measures 8.0 mmHg. Aortic valve peak gradient measures 15.1 mmHg. Pulmonic Valve: The pulmonic valve was not assessed. AORTIC VALVE AV Vmax:      194.00 cm/s AV Vmean:     132.000 cm/s AV VTI:       0.289 m AV Peak Grad: 15.1 mmHg AV Mean Grad: 8.0 mmHg MITRAL VALVE               TRICUSPID VALVE MV Area (PHT): 5.62 cm    TR Peak grad:   16.8 mmHg MV Decel Time: 135 msec    TR Vmax:        205.00 cm/s MV E velocity: 61.30 cm/s MV A velocity: 63.90 cm/s MV E/A ratio:  0.96 Candee Furbish MD Electronically signed by Candee Furbish MD Signature Date/Time: 03/14/2021/10:27:22 AM    Final     Cardiac Studies   Repeat limited echo:    1. Left ventricular ejection fraction, by estimation, is 70 to 75%. The  left ventricle has hyperdynamic function.   2. Right ventricular systolic function is normal. The right ventricular  size is normal. There is normal pulmonary artery systolic pressure. The  estimated right ventricular systolic  pressure is 48.5 mmHg.   3. The mitral valve is grossly normal. No evidence of mitral valve  regurgitation.   4. The aortic valve is grossly normal. Aortic valve regurgitation is not  visualized. No aortic stenosis is present. Aortic valve mean gradient  measures 8.0 mmHg. Aortic valve Vmax measures 1.94 m/s.   Patient Profile     65 y.o. male hx of SDH, epilepsy, ?brief VT arrest during SDH admission, CVA, HTN, EtOH abuse, PAD s/p stenting of popliteal artery, left foot osteomyelitis s/p TMA, COPD. who is being seen 03/14/2021 for the evaluation of VT    Assessment & Plan   Active Problems:   AKI (acute kidney injury) ([place])   Nonsustained ventricular tachycardia ([facility])   Encephalopathy acute   Seizure ([place])   Pressure injury of skin   NSVT - transitioned to oral amiodarone 200 mg BID. This is reasonable for now. I will follow along as patient remains in hospital. May be able to discontinue  amiodarone prior to hospital discharge to avoid medication interactions and for best patient compliance. We will follow tele while in hospital. Agree with 30 day event monitor prior to d/c, we will arrange if d/c M-F. Keep K >4, Mg >2. I have reviewed scanned in telemetry sheet, no VT on tele monitor.  PACs - less significant on amiodarone.   HTN  - confounded by alcohol withdrawal. Will observe for medical therapy as needed. Improved today.      For questions or updates, please contact [place] Please consult [email or web address] for contact info under        Signed, Elouise Munroe, MD  03/15/2021, 11:40 AM

## 2021-03-15 NOTE — Progress Notes (Signed)
Subjective: No clinical seizures overnight.  Extubated this morning, able to follow commands.  States he occasionally drinks.  Per RN, patient's daughter reported he drinks a lot every day  ROS: negative except above  Examination  Vital signs in last 24 hours: Temp:  [97.7 F (36.5 C)-99.6 F (37.6 C)] 97.7 F (36.5 C) (07/19 1200) Pulse Rate:  [74-94] 88 (07/19 1200) Resp:  [16-21] 21 (07/19 1200) BP: (85-134)/(52-89) 101/52 (07/19 1200) SpO2:  [97 %-100 %] 100 % (07/19 1200) Arterial Line BP: (97-165)/(45-73) 148/68 (07/19 1200) FiO2 (%):  [30 %-40 %] 30 % (07/19 0812)  General: lying in bed, NAD CVS: pulse-normal rate and rhythm RS: breathing comfortably, CTAB Extremities: warm,  Left foot amputation Neuro: Awake, alert, oriented to self, place: [place], year 1989, follows commands intermittently like sticking out his tongue, cranial nerves II to XII appear grossly intact, antigravity strength in all extremities  Basic Metabolic Panel: Recent Labs  Lab 03/13/21 2229 03/13/21 2233 03/14/21 0002 03/14/21 0127 03/14/21 0316 03/14/21 0617 03/15/21 0527  NA 137 136 135  --  132* 134* 136  K 3.8 4.5 4.1  --  3.5 3.5 3.4*  CL 95* 106  --   --   --  95* 101  CO2 9*  --   --   --   --  21* 26  GLUCOSE 179* 172*  --   --   --  283* 119*  BUN 16 15  --   --   --  12 15  CREATININE 1.81* 1.40*  --  1.27*  --  1.63* 1.59*  CALCIUM 10.2  --   --   --   --  8.6* 8.6*  MG 3.9*  --   --   --   --  2.7* 2.3  PHOS 11.4*  --   --   --   --  6.6* 3.5    CBC: Recent Labs  Lab 03/13/21 2229 03/13/21 2233 03/14/21 0002 03/14/21 0127 03/14/21 0316 03/14/21 0617 03/15/21 0527  WBC 15.4*  --   --  8.5  --  6.9 7.6  HGB 11.6*   < > 12.2* 11.1* 11.9* 10.3* 8.7*  HCT 40.0   < > 36.0* 33.5* 35.0* 32.3* 26.3*  MCV 100.8*  --   --  87.9  --  89.0 88.3  PLT 228  --   --  168  --  170 130*   < > = values in this interval not displayed.     Coagulation Studies: No results for  input(s): LABPROT, INR in the last 72 hours.  Imaging No new brain imaging overnight  ASSESSMENT AND PLAN: 65 year old male with prior left subdural hematoma presented with seizures.   Epilepsy with breakthrough seizures Left subdural hematoma Acute encephalopathy, improving -Breakthrough seizures in the setting of alcohol use and unclear if patient is compliant with Keppra or not.  Keppra level pending.    Recommendations -Discontinue LTM EEG as no seizures overnight - Continue Keppra 1500 mg twice daily -If any further seizures, can increase Keppra to 2000 mg daily -Alcohol cessation counseling, CIWA protocol, continue thiamine due to alcohol use disorder -Continue seizure precautions -PRN IV Ativan 2 mg for clinical seizure-like activity -Follow-up with neurology in 10 to 12 weeks after discharge -Management of rest of comorbidities per primary team  Seizure precautions: Per [facility] statutes, patients with seizures are not allowed to drive until they have been seizure-free for six months and cleared by a physician  Use caution when using heavy equipment or power tools. Avoid working on ladders or at heights. Take showers instead of baths. Ensure the water temperature is not too high on the home water heater. Do not go swimming alone. Do not lock yourself in a room alone (i.e. bathroom). When caring for infants or small children, sit down when holding, feeding, or changing them to minimize risk of injury to the child in the event you have a seizure. Maintain good sleep hygiene. Avoid alcohol.    If patient has another seizure, call 911 and bring them back to the ED if: A.  The seizure lasts longer than 5 minutes.      B.  The patient doesn't wake shortly after the seizure or has new problems such as difficulty seeing, speaking or moving following the seizure C.  The patient was injured during the seizure D.  The patient has a temperature over 102 F (39C) E.  The patient  vomited during the seizure and now is having trouble breathing    During the Seizure   - First, ensure adequate ventilation and place patients on the floor on their left side  Loosen clothing around the neck and ensure the airway is patent. If the patient is clenching the teeth, do not force the mouth open with any object as this can cause severe damage - Remove all items from the surrounding that can be hazardous. The patient may be oblivious to what's happening and may not even know what he or she is doing. If the patient is confused and wandering, either gently guide him/her away and block access to outside areas - Reassure the individual and be comforting - Call 911. In most cases, the seizure ends before EMS arrives. However, there are cases when seizures may last over 3 to 5 minutes. Or the individual may have developed breathing difficulties or severe injuries. If a pregnant patient or a person with diabetes develops a seizure, it is prudent to call an ambulance. - Finally, if the patient does not regain full consciousness, then call EMS. Most patients will remain confused for about 45 to 90 minutes after a seizure, so you must use judgment in calling for help. - Avoid restraints but make sure the patient is in a bed with padded side rails - Place the individual in a lateral position with the neck slightly flexed; this will help the saliva drain from the mouth and prevent the tongue from falling backward - Remove all nearby furniture and other hazards from the area - Provide verbal assurance as the individual is regaining consciousness - Provide the patient with privacy if possible - Call for help and start treatment as ordered by the caregiver    After the Seizure (Postictal Stage)   After a seizure, most patients experience confusion, fatigue, muscle pain and/or a headache. Thus, one should permit the individual to sleep. For the next few days, reassurance is essential. Being calm and  helping reorient the person is also of importance.   Most seizures are painless and end spontaneously. Seizures are not harmful to others but can lead to complications such as stress on the lungs, brain and the heart. Individuals with prior lung problems may develop labored breathing and respiratory distress.    I have spent a total of  35 minutes with the patient reviewing hospital notes,  test results, labs and examining the patient as well as establishing an assessment and plan. > 50% of time was spent in direct  patient care.   Zeb Comfort Epilepsy [facility] For questions after 5pm please refer to AMION to reach the Neurologist on call

## 2021-03-15 NOTE — Progress Notes (Signed)
Patient transferred to 2C15. Report call and all questions answered. Family was notified of transfer. Patients belongings sent with patient.

## 2021-03-15 NOTE — Progress Notes (Signed)
Performed EEG maintenance.  No skin breakdown observed at Fp1, Fp2, O2, P4.

## 2021-03-15 NOTE — Procedures (Signed)
Extubation Procedure Note  Patient Details:   Name: Seth [name] DOB: [birth year] MRN: [id number]   Airway Documentation:    Vent end date: 03/15/21 Vent end time: 1029   Evaluation  O2 sats: stable throughout Complications: No apparent complications Patient did tolerate procedure well. Bilateral Breath Sounds: Clear, Diminished   Yes  Felecia Jan 03/15/2021, 10:33 AM

## 2021-03-15 NOTE — Plan of Care (Signed)
  Problem: Clinical Measurements: Goal: Ability to maintain clinical measurements within normal limits will improve Outcome: Progressing   Problem: Nutrition: Goal: Adequate nutrition will be maintained Outcome: Progressing   Problem: Pain Managment: Goal: General experience of comfort will improve Outcome: Progressing   Problem: Elimination: Goal: Will not experience complications related to urinary retention Outcome: Not Applicable

## 2021-03-15 NOTE — Progress Notes (Signed)
[facility] ADULT ICU REPLACEMENT PROTOCOL   The patient does apply for the [facility] Adult ICU Electrolyte Replacment Protocol based on the criteria listed below:   1.Exclusion criteria: TCTS patients, ECMO patients and Hypothermia Protocol, and   Dialysis patients 2. Is GFR >/= 30 ml/min? Yes.    Patient's GFR today is 48 3. Is SCr </= 2? Yes.   Patient's SCr is 1.59 mg/dL 4. Did SCr increase >/= 0.5 in 24 hours? No. 5.Pt's weight >40kg  Yes.   6. Abnormal electrolyte(s): K+ 3.4  7. Electrolytes replaced per protocol 8.  Call MD STAT for K+ </= 2.5, Phos </= 1, or Mag </= 1 Physician:  n/a   Darlys Gales 03/15/2021 6:40 AM

## 2021-03-15 NOTE — Progress Notes (Signed)
Pt. was extubated at 03/15/21 @ 82:80 with no complications. Cuff leak was heard and the pt was place on [place] 4L. RT will continue to monitor.

## 2021-03-16 DIAGNOSIS — I472 Ventricular tachycardia: Secondary | ICD-10-CM | POA: Diagnosis not present

## 2021-03-16 DIAGNOSIS — N179 Acute kidney failure, unspecified: Secondary | ICD-10-CM | POA: Diagnosis not present

## 2021-03-16 LAB — GLUCOSE, CAPILLARY
Glucose-Capillary: 100 mg/dL — ABNORMAL HIGH (ref 70–99)
Glucose-Capillary: 73 mg/dL (ref 70–99)
Glucose-Capillary: 82 mg/dL (ref 70–99)
Glucose-Capillary: 84 mg/dL (ref 70–99)
Glucose-Capillary: 86 mg/dL (ref 70–99)
Glucose-Capillary: 88 mg/dL (ref 70–99)
Glucose-Capillary: 89 mg/dL (ref 70–99)

## 2021-03-16 LAB — BASIC METABOLIC PANEL
Anion gap: 10 (ref 5–15)
BUN: 13 mg/dL (ref 8–23)
CO2: 23 mmol/L (ref 22–32)
Calcium: 9 mg/dL (ref 8.9–10.3)
Chloride: 104 mmol/L (ref 98–111)
Creatinine, Ser: 1.36 mg/dL — ABNORMAL HIGH (ref 0.61–1.24)
GFR, Estimated: 58 mL/min — ABNORMAL LOW (ref >60)
Glucose, Bld: 74 mg/dL (ref 70–99)
Potassium: 3.6 mmol/L (ref 3.5–5.1)
Sodium: 137 mmol/L (ref 135–145)

## 2021-03-16 LAB — CBC
HCT: 28.7 % — ABNORMAL LOW (ref 39.0–52.0)
Hemoglobin: 9.1 g/dL — ABNORMAL LOW (ref 13.0–17.0)
MCH: 29.2 pg (ref 26.0–34.0)
MCHC: 31.7 g/dL (ref 30.0–36.0)
MCV: 92 fL (ref 80.0–100.0)
Platelets: 124 10*3/uL — ABNORMAL LOW (ref 150–400)
RBC: 3.12 MIL/uL — ABNORMAL LOW (ref 4.22–5.81)
RDW: 24.3 % — ABNORMAL HIGH (ref 11.5–15.5)
WBC: 8.2 10*3/uL (ref 4.0–10.5)
nRBC: 1.1 % — ABNORMAL HIGH (ref 0.0–0.2)

## 2021-03-16 LAB — PHOSPHORUS: Phosphorus: 2.3 mg/dL — ABNORMAL LOW (ref 2.5–4.6)

## 2021-03-16 LAB — MAGNESIUM: Magnesium: 1.8 mg/dL (ref 1.7–2.4)

## 2021-03-16 MED ORDER — METOPROLOL TARTRATE 25 MG PO TABS
25.0000 mg | ORAL_TABLET | Freq: Two times a day (BID) | ORAL | Status: DC
  Administered 2021-03-16 – 2021-03-18 (×4): 25 mg via ORAL
  Filled 2021-03-16 (×5): qty 1

## 2021-03-16 MED ORDER — ADULT MULTIVITAMIN W/MINERALS CH
1.0000 | ORAL_TABLET | Freq: Every day | ORAL | Status: DC
  Administered 2021-03-17 – 2021-03-22 (×6): 1 via ORAL
  Filled 2021-03-16 (×7): qty 1

## 2021-03-16 MED ORDER — POTASSIUM CHLORIDE CRYS ER 20 MEQ PO TBCR
40.0000 meq | EXTENDED_RELEASE_TABLET | Freq: Once | ORAL | Status: AC
  Administered 2021-03-16: 40 meq via ORAL
  Filled 2021-03-16: qty 2

## 2021-03-16 MED ORDER — LORAZEPAM 2 MG/ML IJ SOLN
1.0000 mg | INTRAMUSCULAR | Status: AC | PRN
  Administered 2021-03-16 (×2): 2 mg via INTRAVENOUS
  Administered 2021-03-17: 1 mg via INTRAVENOUS
  Filled 2021-03-16 (×3): qty 1

## 2021-03-16 MED ORDER — MAGNESIUM SULFATE 2 GM/50ML IV SOLN
2.0000 g | Freq: Once | INTRAVENOUS | Status: AC
  Administered 2021-03-16: 2 g via INTRAVENOUS
  Filled 2021-03-16: qty 50

## 2021-03-16 MED ORDER — DEXTROSE 5 % IV SOLN
15.0000 mmol | Freq: Once | INTRAVENOUS | Status: AC
  Administered 2021-03-17: 15 mmol via INTRAVENOUS
  Filled 2021-03-16: qty 5

## 2021-03-16 MED ORDER — LORAZEPAM 1 MG PO TABS
1.0000 mg | ORAL_TABLET | ORAL | Status: AC | PRN
  Administered 2021-03-17 (×2): 1 mg via ORAL
  Filled 2021-03-16 (×2): qty 1

## 2021-03-16 MED ORDER — AMIODARONE HCL 200 MG PO TABS
200.0000 mg | ORAL_TABLET | Freq: Two times a day (BID) | ORAL | Status: DC
  Administered 2021-03-16: 200 mg via ORAL
  Filled 2021-03-16: qty 1

## 2021-03-16 NOTE — Progress Notes (Signed)
PROGRESS NOTE    Seth [name]  ZOX:[id number] DOB: 03-28-1956 DOA: 03/13/2021 PCP: Lucianne Lei, MD    Brief Narrative:  65 year old male with history of prior subdural hematoma, admitted with recurrent seizures requiring endotracheal intubation for airway protection.  He was also noted to have nonsustained V. tach.  He was admitted to the ICU, Keppra dose was increased and he was followed by neurology.  Seizures have since resolved.  He was also seen by cardiology and started on amiodarone.  Overall heart rhythm has stabilized.  Transferred to [facility] care on 7/20   Assessment & Plan:   Active Problems:   AKI (acute kidney injury) ([place])   Nonsustained ventricular tachycardia (HCC)   Encephalopathy acute   Seizure (HCC)   Pressure injury of skin   Recurrent seizures in the setting of recent subdural hematoma -Patient was admitted in May 2022 for subdural hematoma that was managed nonsurgically.  He was discharged with prophylactic Keppra -Admitted with recurrent seizures -Seen by neurology and Keppra dose has been increased -On LTM, further seizures have resolved  Nonsustained ventricular tachycardia -Was started on amiodarone per cardiology -This will be potentially discontinued prior to discharge -Keep potassium greater than 4, magnesium greater than 2  Acute kidney injury -Creatinine 1.8 on arrival, likely related to volume depletion -Improved with IV hydration to 1.3  Respiratory failure secondary to recurrent seizures -Was briefly intubated for airway management -Extubated on 7/19 and currently on room air  Disposition -PT/OT   DVT prophylaxis: enoxaparin (LOVENOX) injection 40 mg Start: 03/14/21 1000 SCDs Start: 03/13/21 2352  Code Status: Full code Family Communication: Discussed with patient Disposition Plan: Status is: Inpatient  Remains inpatient appropriate because:Ongoing diagnostic testing needed not appropriate for outpatient work up, IV treatments  appropriate due to intensity of illness or inability to take PO, and Inpatient level of care appropriate due to severity of illness  Dispo: The patient is from: Home              Anticipated d/c is to:  TBD              Patient currently is not medically stable to d/c.   Difficult to place patient No         Consultants:  Neurology Cardiology PCCM  Procedures:  ETT 7/17 > 7/19  Antimicrobials:      Subjective: Patient seen in his room.  Laying in bed.  At first, he does not recall why he was admitted to the hospital.  When I told him he was having seizures, he then said he remembered.  Objective: Vitals:   03/16/21 0712 03/16/21 1118 03/16/21 1500 03/16/21 1936  BP: (!) 157/90 (!) 142/75 (!) 169/104 (!) 177/94  Pulse: 97 (!) 104  95  Resp: 20 20    Temp:  98.9 F (37.2 C) 97.9 F (36.6 C) 98.3 F (36.8 C)  TempSrc:  Oral Oral Oral  SpO2:  98%    Weight:      Height:        Intake/Output Summary (Last 24 hours) at 03/16/2021 1937 Last data filed at 03/16/2021 1600 Gross per 24 hour  Intake 511.27 ml  Output 1050 ml  Net -538.73 ml   Filed Weights   03/13/21 2348 03/16/21 0417  Weight: 63.5 kg 66.9 kg    Examination:  General exam: Appears calm and comfortable  Respiratory system: Clear to auscultation. Respiratory effort normal. Cardiovascular system: S1 & S2 heard, RRR. No JVD, murmurs, rubs, gallops or  clicks. No pedal edema. Gastrointestinal system: Abdomen is nondistended, soft and nontender. No organomegaly or masses felt. Normal bowel sounds heard. Central nervous system: Alert and oriented. No focal neurological deficits. Extremities: Left foot TMA Skin: No rashes, lesions or ulcers Psychiatry: Judgement and insight appear normal. Mood & affect appropriate.     Data Reviewed: I have personally reviewed following labs and imaging studies  CBC: Recent Labs  Lab 03/13/21 2229 03/13/21 2233 03/14/21 0127 03/14/21 0316 03/14/21 0617  03/15/21 0527 03/16/21 0113  WBC 15.4*  --  8.5  --  6.9 7.6 8.2  HGB 11.6*   < > 11.1* 11.9* 10.3* 8.7* 9.1*  HCT 40.0   < > 33.5* 35.0* 32.3* 26.3* 28.7*  MCV 100.8*  --  87.9  --  89.0 88.3 92.0  PLT 228  --  168  --  170 130* 124*   < > = values in this interval not displayed.   Basic Metabolic Panel: Recent Labs  Lab 03/13/21 2229 03/13/21 2233 03/14/21 0002 03/14/21 0127 03/14/21 0316 03/14/21 0617 03/15/21 0527 03/16/21 0113  NA 137 136 135  --  132* 134* 136 137  K 3.8 4.5 4.1  --  3.5 3.5 3.4* 3.6  CL 95* 106  --   --   --  95* 101 104  CO2 9*  --   --   --   --  21* 26 23  GLUCOSE 179* 172*  --   --   --  283* 119* 74  BUN 16 15  --   --   --  '12 15 13  ' CREATININE 1.81* 1.40*  --  1.27*  --  1.63* 1.59* 1.36*  CALCIUM 10.2  --   --   --   --  8.6* 8.6* 9.0  MG 3.9*  --   --   --   --  2.7* 2.3 1.8  PHOS 11.4*  --   --   --   --  6.6* 3.5 2.3*   GFR: Estimated Creatinine Clearance: 52.6 mL/min (A) (by C-G formula based on SCr of 1.36 mg/dL (H)). Liver Function Tests: Recent Labs  Lab 03/13/21 2229  AST 54*  ALT 17  ALKPHOS 95  BILITOT 1.6*  PROT 8.0  ALBUMIN 4.3   No results for input(s): LIPASE, AMYLASE in the last 168 hours. No results for input(s): AMMONIA in the last 168 hours. Coagulation Profile: No results for input(s): INR, PROTIME in the last 168 hours. Cardiac Enzymes: No results for input(s): CKTOTAL, CKMB, CKMBINDEX, TROPONINI in the last 168 hours. BNP (last 3 results) No results for input(s): PROBNP in the last 8760 hours. HbA1C: Recent Labs    03/14/21 0825  HGBA1C 4.4*   CBG: Recent Labs  Lab 03/16/21 0134 03/16/21 0340 03/16/21 0802 03/16/21 1245 03/16/21 1624  GLUCAP 82 86 89 100* 88   Lipid Profile: No results for input(s): CHOL, HDL, LDLCALC, TRIG, CHOLHDL, LDLDIRECT in the last 72 hours. Thyroid Function Tests: No results for input(s): TSH, T4TOTAL, FREET4, T3FREE, THYROIDAB in the last 72 hours. Anemia Panel: No  results for input(s): VITAMINB12, FOLATE, FERRITIN, TIBC, IRON, RETICCTPCT in the last 72 hours. Sepsis Labs: No results for input(s): PROCALCITON, LATICACIDVEN in the last 168 hours.  Recent Results (from the past 240 hour(s))  Resp Panel by RT-PCR (Flu A&B, Covid) Nasopharyngeal Swab     Status: None   Collection Time: 03/13/21 10:32 PM   Specimen: Nasopharyngeal Swab; Nasopharyngeal(NP) swabs in vial transport medium  Result Value Ref Range Status   SARS Coronavirus 2 by RT PCR NEGATIVE NEGATIVE Final    Comment: (NOTE) SARS-CoV-2 target nucleic acids are NOT DETECTED.  The SARS-CoV-2 RNA is generally detectable in upper respiratory specimens during the acute phase of infection. The lowest concentration of SARS-CoV-2 viral copies this assay can detect is 138 copies/mL. A negative result does not preclude SARS-Cov-2 infection and should not be used as the sole basis for treatment or other patient management decisions. A negative result may occur with  improper specimen collection/handling, submission of specimen other than nasopharyngeal swab, presence of viral mutation(s) within the areas targeted by this assay, and inadequate number of viral copies(<138 copies/mL). A negative result must be combined with clinical observations, patient history, and epidemiological information. The expected result is Negative.  Fact Sheet for Patients:  EntrepreneurPulse.com.au  Fact Sheet for Healthcare Providers:  IncredibleEmployment.be  This test is no t yet approved or cleared by the [place] FDA and  has been authorized for detection and/or diagnosis of SARS-CoV-2 by FDA under an Emergency Use Authorization (EUA). This EUA will remain  in effect (meaning this test can be used) for the duration of the COVID-19 declaration under Section 564(b)(1) of the Act, 21 U.S.C.section 360bbb-3(b)(1), unless the authorization is terminated  or revoked sooner.        Influenza A by PCR NEGATIVE NEGATIVE Final   Influenza B by PCR NEGATIVE NEGATIVE Final    Comment: (NOTE) The Xpert Xpress SARS-CoV-2/FLU/RSV plus assay is intended as an aid in the diagnosis of influenza from Nasopharyngeal swab specimens and should not be used as a sole basis for treatment. Nasal washings and aspirates are unacceptable for Xpert Xpress SARS-CoV-2/FLU/RSV testing.  Fact Sheet for Patients: EntrepreneurPulse.com.au  Fact Sheet for Healthcare Providers: IncredibleEmployment.be  This test is not yet approved or cleared by the [place] FDA and has been authorized for detection and/or diagnosis of SARS-CoV-2 by FDA under an Emergency Use Authorization (EUA). This EUA will remain in effect (meaning this test can be used) for the duration of the COVID-19 declaration under Section 564(b)(1) of the Act, 21 U.S.C. section 360bbb-3(b)(1), unless the authorization is terminated or revoked.  Performed at [facility], [place] [address], [place], [place] [address]   MRSA Next Gen by PCR, Nasal     Status: None   Collection Time: 03/14/21  1:28 AM   Specimen: Nasal Mucosa; Nasal Swab  Result Value Ref Range Status   MRSA by PCR Next Gen NOT DETECTED NOT DETECTED Final    Comment: (NOTE) The GeneXpert MRSA Assay (FDA approved for NASAL specimens only), is one component of a comprehensive MRSA colonization surveillance program. It is not intended to diagnose MRSA infection nor to guide or monitor treatment for MRSA infections. Test performance is not FDA approved in patients less than 66 years old. Performed at [facility], [place] [address], [place], [place] [address]          Radiology Studies: No results found.      Scheduled Meds:  amiodarone  200 mg Oral BID   chlorhexidine gluconate (MEDLINE KIT)  15 mL Mouth Rinse BID   Chlorhexidine Gluconate Cloth  6 each Topical Daily   enoxaparin (LOVENOX)  injection  40 mg Subcutaneous Q24H   feeding supplement  237 mL Oral BID BM   folic acid  1 mg Intravenous Daily   insulin aspart  0-15 Units Subcutaneous Q4H   mouth rinse  15 mL  Mouth Rinse 10 times per day   thiamine  100 mg Intravenous Daily   Continuous Infusions:  sodium chloride     levETIRAcetam Stopped (03/16/21 0900)     LOS: 3 days    Time spent: 85mns    JKathie Dike MD [facility]   If 7PM-7AM, please contact night-coverage [email or web address]  03/16/2021, 7:37 PM

## 2021-03-16 NOTE — Progress Notes (Addendum)
Chart reviewed, patient not seen. Discussed with bedside nurse Suezanne Jacquet.  Tele reviewed, sinus tachycardia with no NSVT.   Can continue amiodarone orally today. I may discontinue this prior to discharge given no recurrence since day of admission.   Cardiology will follow while in hospital.

## 2021-03-17 ENCOUNTER — Other Ambulatory Visit: Payer: Self-pay | Admitting: Cardiology

## 2021-03-17 DIAGNOSIS — R000 Tachycardia, unspecified: Secondary | ICD-10-CM

## 2021-03-17 DIAGNOSIS — I472 Ventricular tachycardia: Secondary | ICD-10-CM | POA: Diagnosis not present

## 2021-03-17 DIAGNOSIS — N179 Acute kidney failure, unspecified: Secondary | ICD-10-CM | POA: Diagnosis not present

## 2021-03-17 LAB — GLUCOSE, CAPILLARY
Glucose-Capillary: 111 mg/dL — ABNORMAL HIGH (ref 70–99)
Glucose-Capillary: 71 mg/dL (ref 70–99)
Glucose-Capillary: 81 mg/dL (ref 70–99)
Glucose-Capillary: 82 mg/dL (ref 70–99)
Glucose-Capillary: 90 mg/dL (ref 70–99)
Glucose-Capillary: 93 mg/dL (ref 70–99)

## 2021-03-17 LAB — RENAL FUNCTION PANEL
Albumin: 2.9 g/dL — ABNORMAL LOW (ref 3.5–5.0)
Anion gap: 13 (ref 5–15)
BUN: 9 mg/dL (ref 8–23)
CO2: 22 mmol/L (ref 22–32)
Calcium: 9.1 mg/dL (ref 8.9–10.3)
Chloride: 99 mmol/L (ref 98–111)
Creatinine, Ser: 1.04 mg/dL (ref 0.61–1.24)
GFR, Estimated: >60 mL/min (ref >60)
Glucose, Bld: 109 mg/dL — ABNORMAL HIGH (ref 70–99)
Phosphorus: 4.1 mg/dL (ref 2.5–4.6)
Potassium: 3.9 mmol/L (ref 3.5–5.1)
Sodium: 134 mmol/L — ABNORMAL LOW (ref 135–145)

## 2021-03-17 LAB — MAGNESIUM: Magnesium: 1.9 mg/dL (ref 1.7–2.4)

## 2021-03-17 MED ORDER — LORATADINE 10 MG PO TABS
10.0000 mg | ORAL_TABLET | Freq: Every day | ORAL | Status: DC
  Administered 2021-03-18 – 2021-03-22 (×5): 10 mg via ORAL
  Filled 2021-03-17 (×6): qty 1

## 2021-03-17 MED ORDER — POTASSIUM CHLORIDE 20 MEQ PO PACK
40.0000 meq | PACK | Freq: Every day | ORAL | Status: AC
  Administered 2021-03-18: 40 meq via ORAL
  Filled 2021-03-17 (×2): qty 2

## 2021-03-17 MED ORDER — FOLIC ACID 1 MG PO TABS
1.0000 mg | ORAL_TABLET | Freq: Every day | ORAL | Status: DC
  Administered 2021-03-17 – 2021-03-22 (×6): 1 mg via ORAL
  Filled 2021-03-17 (×7): qty 1

## 2021-03-17 MED ORDER — MAGNESIUM OXIDE -MG SUPPLEMENT 400 (240 MG) MG PO TABS
400.0000 mg | ORAL_TABLET | Freq: Every day | ORAL | Status: DC
  Administered 2021-03-17 – 2021-03-22 (×6): 400 mg via ORAL
  Filled 2021-03-17 (×7): qty 1

## 2021-03-17 MED ORDER — LEVETIRACETAM 500 MG PO TABS
1500.0000 mg | ORAL_TABLET | Freq: Two times a day (BID) | ORAL | Status: DC
  Administered 2021-03-17 – 2021-03-22 (×12): 1500 mg via ORAL
  Filled 2021-03-17 (×12): qty 3

## 2021-03-17 MED ORDER — POTASSIUM CHLORIDE CRYS ER 20 MEQ PO TBCR: 40.0000 meq | EXTENDED_RELEASE_TABLET | Freq: Every day | ORAL | Status: DC

## 2021-03-17 MED ORDER — THIAMINE HCL 100 MG PO TABS
100.0000 mg | ORAL_TABLET | Freq: Every day | ORAL | Status: DC
  Administered 2021-03-17 – 2021-03-22 (×6): 100 mg via ORAL
  Filled 2021-03-17 (×7): qty 1

## 2021-03-17 NOTE — Plan of Care (Signed)
  Problem: Education: Goal: Knowledge of General Education information will improve Description: Including pain rating scale, medication(s)/side effects and non-pharmacologic comfort measures Outcome: Progressing   Problem: Clinical Measurements: Goal: Cardiovascular complication will be avoided Outcome: Progressing   Problem: Nutrition: Goal: Adequate nutrition will be maintained Outcome: Progressing   Problem: Pain Managment: Goal: General experience of comfort will improve Outcome: Progressing   Problem: Safety: Goal: Ability to remain free from injury will improve Outcome: Progressing   

## 2021-03-17 NOTE — Progress Notes (Signed)
Progress Note  Patient Name: Seth [name] Date of Encounter: 03/17/2021  Primary Cardiologist: None   Subjective   NAE  Inpatient Medications    Scheduled Meds:  chlorhexidine gluconate (MEDLINE KIT)  15 mL Mouth Rinse BID   Chlorhexidine Gluconate Cloth  6 each Topical Daily   enoxaparin (LOVENOX) injection  40 mg Subcutaneous Q24H   feeding supplement  237 mL Oral BID BM   folic acid  1 mg Intravenous Daily   insulin aspart  0-15 Units Subcutaneous Q4H   mouth rinse  15 mL Mouth Rinse 10 times per day   metoprolol tartrate  25 mg Oral BID   multivitamin with minerals  1 tablet Oral Daily   thiamine  100 mg Intravenous Daily   Continuous Infusions:  sodium chloride     levETIRAcetam 1,500 mg (03/17/21 0853)   PRN Meds: Place/Maintain arterial line **AND** sodium chloride, acetaminophen, docusate sodium, hydrALAZINE, ipratropium-albuterol, LORazepam **OR** LORazepam, ondansetron (ZOFRAN) IV, polyethylene glycol   Vital Signs    Vitals:   03/17/21 0200 03/17/21 0300 03/17/21 0400 03/17/21 0700  BP: 138/86 122/78  139/89  Pulse: (!) 102 90 (!) 118 94  Resp:  18  18  Temp:  98.5 F (36.9 C)  98.5 F (36.9 C)  TempSrc:  Oral  Oral  SpO2:  98%  100%  Weight:      Height:        Intake/Output Summary (Last 24 hours) at 03/17/2021 0916 Last data filed at 03/17/2021 0450 Gross per 24 hour  Intake 373.86 ml  Output 350 ml  Net 23.86 ml   Filed Weights   03/13/21 2348 03/16/21 0417  Weight: 63.5 kg 66.9 kg    Telemetry    Sinus tach. No VT - Personally Reviewed  ECG    ST and PVCs - Personally Reviewed  Physical Exam   GEN: No acute distress.   Neck: No JVD Cardiac: reg rhythm tachycardic, no murmurs, rubs, or gallops.  Respiratory: Clear to auscultation bilaterally. GI: Soft, nontender, non-distended  MS: No edema; No deformity. Neuro:  Nonfocal  Psych: Normal affect   Labs    Chemistry Recent Labs  Lab 03/13/21 2229 03/13/21 2233  03/15/21 0527 03/16/21 0113 03/17/21 0511  NA 137   < > 136 137 134*  K 3.8   < > 3.4* 3.6 3.9  CL 95*   < > 101 104 99  CO2 9*   < > _0 GLUCOSE 179*   < > 119* 74 109*  BUN 16   < > _1 CREATININE 1.81*   < > 1.59* 1.36* 1.04  CALCIUM 10.2   < > 8.6* 9.0 9.1  PROT 8.0  --   --   --   --   ALBUMIN 4.3  --   --   --  2.9*  AST 54*  --   --   --   --   ALT 17  --   --   --   --   ALKPHOS 95  --   --   --   --   BILITOT 1.6*  --   --   --   --   GFRNONAA 41*   < > 48* 58* >60  ANIONGAP 33*   < > _2 < > = values in this interval not displayed.     Hematology Recent Labs  Lab 03/14/21 0617 03/15/21 4818 03/16/21 0113  WBC 6.9 7.6 8.2  RBC 3.63* 2.98* 3.12*  HGB 10.3* 8.7* 9.1*  HCT 32.3* 26.3* 28.7*  MCV 89.0 88.3 92.0  MCH 28.4 29.2 29.2  MCHC 31.9 33.1 31.7  RDW 22.8* 24.1* 24.3*  PLT 170 130* 124*    Cardiac EnzymesNo results for input(s): TROPONINI in the last 168 hours. No results for input(s): TROPIPOC in the last 168 hours.   BNPNo results for input(s): BNP, PROBNP in the last 168 hours.   DDimer No results for input(s): DDIMER in the last 168 hours.   Radiology    No results found.  Cardiac Studies   Repeat limited echo:    1. Left ventricular ejection fraction, by estimation, is 70 to 75%. The  left ventricle has hyperdynamic function.   2. Right ventricular systolic function is normal. The right ventricular  size is normal. There is normal pulmonary artery systolic pressure. The  estimated right ventricular systolic pressure is 38.8 mmHg.   3. The mitral valve is grossly normal. No evidence of mitral valve  regurgitation.   4. The aortic valve is grossly normal. Aortic valve regurgitation is not  visualized. No aortic stenosis is present. Aortic valve mean gradient  measures 8.0 mmHg. Aortic valve Vmax measures 1.94 m/s.   Patient Profile     65 y.o. male hx of SDH, epilepsy, ?brief VT arrest during SDH admission, CVA, HTN,  EtOH abuse, PAD s/p stenting of popliteal artery, left foot osteomyelitis s/p TMA, COPD. who is being seen 03/14/2021 for the evaluation of VT.    Assessment & Plan   Active Problems:   AKI (acute kidney injury) ([place])   Nonsustained ventricular tachycardia (HCC)   Encephalopathy acute   Seizure (HCC)   Pressure injury of skin   NSVT - transitioned to oral amiodarone 200 mg BID - will stop this today given no ectopy or NSVT in > 72 hrs. Stopping amiodarone to avoid medication interactions and for simplification of medication regimen home going. Agree with 30 day event monitor prior to d/c, we will arrange. Keep K >4, Mg >2. Continue metoprolol, can uptitrate as needed for BP.  PACs - none on tele last 3 days.  HTN  - confounded by alcohol withdrawal. Improved.  Cardiology will sign off at this time, follow up will be arranged.      For questions or updates, please contact [place] Please consult [email or web address] for contact info under        Signed, Elouise Munroe, MD  03/17/2021, 9:16 AM

## 2021-03-17 NOTE — Progress Notes (Signed)
PROGRESS NOTE    Seth [name]  SVX:[id number] DOB: 06/16/1956 DOA: 03/13/2021 PCP: Lucianne Lei, MD    Brief Narrative:  65 year old male with history of prior subdural hematoma, admitted with recurrent seizures requiring endotracheal intubation for airway protection.  He was also noted to have nonsustained V. tach.  He was admitted to the ICU, Keppra dose was increased and he was followed by neurology.  Seizures have since resolved.  He was also seen by cardiology and started on amiodarone.  Overall heart rhythm has stabilized.  Transferred to [facility] care on 7/20   Assessment & Plan:   Active Problems:   AKI (acute kidney injury) ([place])   Nonsustained ventricular tachycardia (HCC)   Encephalopathy acute   Seizure (HCC)   Pressure injury of skin   Recurrent seizures in the setting of recent subdural hematoma -Patient was admitted in May 2022 for subdural hematoma that was managed nonsurgically.  He was discharged with prophylactic Keppra -Admitted with recurrent seizures -Seen by neurology and Keppra dose has been increased to 1571m bid -On LTM, further seizures have resolved  Nonsustained ventricular tachycardia -Was started on amiodarone per cardiology, now discontinued -no further ectopy or NSVT noted -30 day event monitor arranged by cardiology -Keep potassium greater than 4, magnesium greater than 2  Acute kidney injury -Creatinine 1.8 on arrival, likely related to volume depletion -Improved with IV hydration to 1.0  Respiratory failure secondary to recurrent seizures -Was briefly intubated for airway management -Extubated on 7/19 and currently on room air  Disposition -PT/OT -I suspect he will need placement, since per reports, he normally lives alone   DVT prophylaxis: enoxaparin (LOVENOX) injection 40 mg Start: 03/14/21 1000 SCDs Start: 03/13/21 2352  Code Status: Full code Family Communication: Discussed with patient Disposition Plan: Status is:  Inpatient  Remains inpatient appropriate because:Ongoing diagnostic testing needed not appropriate for outpatient work up, IV treatments appropriate due to intensity of illness or inability to take PO, and Inpatient level of care appropriate due to severity of illness  Dispo: The patient is from: Home              Anticipated d/c is to:  TBD              Patient currently is not medically stable to d/c.   Difficult to place patient No         Consultants:  Neurology Cardiology PCCM  Procedures:  ETT 7/17 > 7/19  Antimicrobials:      Subjective: Sitting up in chair. He knows he is in the hospital. He is eating lunch. Speech difficult to understand, very hypophonic  Objective: Vitals:   03/17/21 0300 03/17/21 0400 03/17/21 0700 03/17/21 1134  BP: 122/78  139/89 (!) 131/97  Pulse: 90 (!) 118 94   Resp: 18  18 (!) 21  Temp: 98.5 F (36.9 C)  98.5 F (36.9 C) 98.7 F (37.1 C)  TempSrc: Oral  Oral Oral  SpO2: 98%  100% 100%  Weight:      Height:        Intake/Output Summary (Last 24 hours) at 03/17/2021 1424 Last data filed at 03/17/2021 0450 Gross per 24 hour  Intake 373.86 ml  Output --  Net 373.86 ml   Filed Weights   03/13/21 2348 03/16/21 0417  Weight: 63.5 kg 66.9 kg    Examination:  General exam: Alert, awake, no distress Respiratory system: Clear to auscultation. Respiratory effort normal. Cardiovascular system:RRR. No murmurs, rubs, gallops. Gastrointestinal system: Abdomen is  nondistended, soft and nontender. No organomegaly or masses felt. Normal bowel sounds heard. Central nervous system: No focal neurological deficits. Extremities: left foot TMA Skin: No rashes, lesions or ulcers Psychiatry: seems to be mildly confused, speech is difficult to comprehend, hypophonic     Data Reviewed: I have personally reviewed following labs and imaging studies  CBC: Recent Labs  Lab 03/13/21 2229 03/13/21 2233 03/14/21 0127 03/14/21 0316  03/14/21 0617 03/15/21 0527 03/16/21 0113  WBC 15.4*  --  8.5  --  6.9 7.6 8.2  HGB 11.6*   < > 11.1* 11.9* 10.3* 8.7* 9.1*  HCT 40.0   < > 33.5* 35.0* 32.3* 26.3* 28.7*  MCV 100.8*  --  87.9  --  89.0 88.3 92.0  PLT 228  --  168  --  170 130* 124*   < > = values in this interval not displayed.   Basic Metabolic Panel: Recent Labs  Lab 03/13/21 2229 03/13/21 2233 03/14/21 0002 03/14/21 0127 03/14/21 0316 03/14/21 0617 03/15/21 0527 03/16/21 0113 03/17/21 0511  NA 137 136   < >  --  132* 134* 136 137 134*  K 3.8 4.5   < >  --  3.5 3.5 3.4* 3.6 3.9  CL 95* 106  --   --   --  95* 101 104 99  CO2 9*  --   --   --   --  21* 26 23 22  GLUCOSE 179* 172*  --   --   --  283* 119* 74 109*  BUN 16 15  --   --   --  12 15 13 9  CREATININE 1.81* 1.40*  --  1.27*  --  1.63* 1.59* 1.36* 1.04  CALCIUM 10.2  --   --   --   --  8.6* 8.6* 9.0 9.1  MG 3.9*  --   --   --   --  2.7* 2.3 1.8 1.9  PHOS 11.4*  --   --   --   --  6.6* 3.5 2.3* 4.1   < > = values in this interval not displayed.   GFR: Estimated Creatinine Clearance: 68.8 mL/min (by C-G formula based on SCr of 1.04 mg/dL). Liver Function Tests: Recent Labs  Lab 03/13/21 2229 03/17/21 0511  AST 54*  --   ALT 17  --   ALKPHOS 95  --   BILITOT 1.6*  --   PROT 8.0  --   ALBUMIN 4.3 2.9*   No results for input(s): LIPASE, AMYLASE in the last 168 hours. No results for input(s): AMMONIA in the last 168 hours. Coagulation Profile: No results for input(s): INR, PROTIME in the last 168 hours. Cardiac Enzymes: No results for input(s): CKTOTAL, CKMB, CKMBINDEX, TROPONINI in the last 168 hours. BNP (last 3 results) No results for input(s): PROBNP in the last 8760 hours. HbA1C: No results for input(s): HGBA1C in the last 72 hours.  CBG: Recent Labs  Lab 03/16/21 1945 03/16/21 2332 03/17/21 0415 03/17/21 0843 03/17/21 1131  GLUCAP 73 84 82 111* 90   Lipid Profile: No results for input(s): CHOL, HDL, LDLCALC, TRIG,  CHOLHDL, LDLDIRECT in the last 72 hours. Thyroid Function Tests: No results for input(s): TSH, T4TOTAL, FREET4, T3FREE, THYROIDAB in the last 72 hours. Anemia Panel: No results for input(s): VITAMINB12, FOLATE, FERRITIN, TIBC, IRON, RETICCTPCT in the last 72 hours. Sepsis Labs: No results for input(s): PROCALCITON, LATICACIDVEN in the last 168 hours.  Recent Results (from the past 240 hour(s))    Resp Panel by RT-PCR (Flu A&B, Covid) Nasopharyngeal Swab     Status: None   Collection Time: 03/13/21 10:32 PM   Specimen: Nasopharyngeal Swab; Nasopharyngeal(NP) swabs in vial transport medium  Result Value Ref Range Status   SARS Coronavirus 2 by RT PCR NEGATIVE NEGATIVE Final    Comment: (NOTE) SARS-CoV-2 target nucleic acids are NOT DETECTED.  The SARS-CoV-2 RNA is generally detectable in upper respiratory specimens during the acute phase of infection. The lowest concentration of SARS-CoV-2 viral copies this assay can detect is 138 copies/mL. A negative result does not preclude SARS-Cov-2 infection and should not be used as the sole basis for treatment or other patient management decisions. A negative result may occur with  improper specimen collection/handling, submission of specimen other than nasopharyngeal swab, presence of viral mutation(s) within the areas targeted by this assay, and inadequate number of viral copies(<138 copies/mL). A negative result must be combined with clinical observations, patient history, and epidemiological information. The expected result is Negative.  Fact Sheet for Patients:  EntrepreneurPulse.com.au  Fact Sheet for Healthcare Providers:  IncredibleEmployment.be  This test is no t yet approved or cleared by the [place] FDA and  has been authorized for detection and/or diagnosis of SARS-CoV-2 by FDA under an Emergency Use Authorization (EUA). This EUA will remain  in effect (meaning this test can be used) for  the duration of the COVID-19 declaration under Section 564(b)(1) of the Act, 21 U.S.C.section 360bbb-3(b)(1), unless the authorization is terminated  or revoked sooner.       Influenza A by PCR NEGATIVE NEGATIVE Final   Influenza B by PCR NEGATIVE NEGATIVE Final    Comment: (NOTE) The Xpert Xpress SARS-CoV-2/FLU/RSV plus assay is intended as an aid in the diagnosis of influenza from Nasopharyngeal swab specimens and should not be used as a sole basis for treatment. Nasal washings and aspirates are unacceptable for Xpert Xpress SARS-CoV-2/FLU/RSV testing.  Fact Sheet for Patients: EntrepreneurPulse.com.au  Fact Sheet for Healthcare Providers: IncredibleEmployment.be  This test is not yet approved or cleared by the [place] FDA and has been authorized for detection and/or diagnosis of SARS-CoV-2 by FDA under an Emergency Use Authorization (EUA). This EUA will remain in effect (meaning this test can be used) for the duration of the COVID-19 declaration under Section 564(b)(1) of the Act, 21 U.S.C. section 360bbb-3(b)(1), unless the authorization is terminated or revoked.  Performed at [facility], [place] [address], [place], [place] [address]   MRSA Next Gen by PCR, Nasal     Status: None   Collection Time: 03/14/21  1:28 AM   Specimen: Nasal Mucosa; Nasal Swab  Result Value Ref Range Status   MRSA by PCR Next Gen NOT DETECTED NOT DETECTED Final    Comment: (NOTE) The GeneXpert MRSA Assay (FDA approved for NASAL specimens only), is one component of a comprehensive MRSA colonization surveillance program. It is not intended to diagnose MRSA infection nor to guide or monitor treatment for MRSA infections. Test performance is not FDA approved in patients less than 38 years old. Performed at [facility], [place] [address] [place], [place] [address]          Radiology Studies: No results found.      Scheduled Meds:   chlorhexidine gluconate (MEDLINE KIT)  15 mL Mouth Rinse BID   Chlorhexidine Gluconate Cloth  6 each Topical Daily   enoxaparin (LOVENOX) injection  40 mg Subcutaneous Q24H   feeding supplement  237 mL Oral BID  BM   folic acid  1 mg Oral Daily   insulin aspart  0-15 Units Subcutaneous Q4H   levETIRAcetam  1,500 mg Oral BID   mouth rinse  15 mL Mouth Rinse 10 times per day   metoprolol tartrate  25 mg Oral BID   multivitamin with minerals  1 tablet Oral Daily   thiamine  100 mg Oral Daily   Continuous Infusions:  sodium chloride       LOS: 4 days    Time spent: 85mns    JKathie Dike MD [facility]   If 7PM-7AM, please contact night-coverage [email or web address]  03/17/2021, 2:24 PM

## 2021-03-17 NOTE — Evaluation (Signed)
Occupational Therapy Evaluation Patient Details Name: Seth [name] MRN: [id number] DOB: 02-Jan-1956 Today's Date: 03/17/2021    History of Present Illness pt is a 65 y/o male admitted 7/17 after 3 witnessed seizures at home; found to be unresponsive by EMS.  It is unclear how long the patiet was down.  On arrival pt was urgently intubated in the ED.  7/19 extubated, on LTM, seizures have resolved.  PMHx:  stroke/SDH, HTN, gout, admission in may for SDH   Clinical Impression   Pt admitted with the above diagnoses and presents with below problem list. Pt will benefit from continued acute OT to address the below listed deficits and maximize independence with basic ADLs prior to d/c to venue below. PTA pt was managing ADLs at home where he lives alone. Unclear how much pt was mobilizing at home and level of assist he was receiving from others. Pt currently needs max A with grooming and UB ADLs, max -total A +2 with LB ADLs. +2 max- total A to squat pivot to recliner.      Follow Up Recommendations  SNF    Equipment Recommendations  Other (comment) (defer to next venue)    Recommendations for Other Services       Precautions / Restrictions Precautions Precautions: Fall Restrictions Weight Bearing Restrictions: No      Mobility Bed Mobility Overal bed mobility: Needs Assistance Bed Mobility: Supine to Sit           General bed mobility comments: cues for sequencing and staying focused on task.  significant truncal assist forward and use of the pad to help advance pelvis forward toward EOB.  Need for consistent help staying forward in midline.    Transfers Overall transfer level: Needs assistance Equipment used: Rolling Springer (2 wheeled) Transfers: Sit to/from [facility] Sit to Stand: Max assist;+2 physical assistance Stand pivot transfers: Max assist;+2 physical assistance;+2 safety/equipment       General transfer comment: cues for hand placement and  for w/shifting foward over his feet/BOS.  Assist translating weight forward with some boost into the RW.  Removal of the RW to get a more face to face positioning to assist with pivot to the chair due to pt's significant bias posteriorly.    Balance Overall balance assessment: Needs assistance Sitting-balance support: Bilateral upper extremity supported;Feet supported Sitting balance-[name] Scale: Poor Sitting balance - Comments: up to mod A in static position d/t posterior lean Postural control: Posterior lean Standing balance support: Bilateral upper extremity supported;During functional activity Standing balance-[name] Scale: Zero Standing balance comment: rw and max A +2 to attempt to stand                           ADL either performed or assessed with clinical judgement   ADL Overall ADL's : Needs assistance/impaired Eating/Feeding: Set up;Sitting Eating/Feeding Details (indicate cue type and reason): supported sitting Grooming: Maximal assistance;Sitting   Upper Body Bathing: Maximal assistance;Sitting   Lower Body Bathing: Maximal assistance;+2 for physical assistance;Sit to/from stand;Sitting/lateral leans   Upper Body Dressing : Maximal assistance;Sitting   Lower Body Dressing: Maximal assistance;+2 for physical assistance;Sitting/lateral leans;Sit to/from stand   Toilet Transfer: Maximal assistance;+2 for physical assistance;Squat-pivot;BSC             General ADL Comments: Bed mobiltiy, sat EOB a few minutes with up to mod A d/t posterior lean. Struggled with trasnfer to [employer]   Additional Comments: plan  to further assess next session     Perception     Praxis      Pertinent Vitals/Pain Pain Assessment: Faces Faces Pain Scale: No hurt Pain Intervention(s): Monitored during session     Hand Dominance Right   Extremity/Trunk Assessment Upper Extremity Assessment Upper Extremity Assessment: Generalized weakness;Difficult to  assess due to impaired cognition   Lower Extremity Assessment Lower Extremity Assessment: Defer to PT evaluation RLE Deficits / Details: moves against gravity, not well coordinated.  In stance pt unable to w/bear fully RLE Coordination: decreased fine motor LLE Deficits / Details: uncoordinated movement, assist to move off the bed, weakness with w/bearing in stance. LLE Coordination: decreased fine motor       Communication Communication Communication: Expressive difficulties;Other (comment) (low volume, dysarthic speech)   Cognition Arousal/Alertness: Awake/alert Behavior During Therapy: Flat affect Overall Cognitive Status: Impaired/Different from baseline Area of Impairment: Orientation;Attention;Safety/judgement;Awareness;Problem solving                 Orientation Level: Situation Current Attention Level: Sustained;Focused     Safety/Judgement: Decreased awareness of safety;Decreased awareness of deficits Awareness: Intellectual Problem Solving: Slow processing;Decreased initiation;Requires verbal cues;Requires tactile cues     General Comments       Exercises     Shoulder Instructions      Home Living Family/patient expects to be discharged to:: Private residence Living Arrangements: Alone Available Help at Discharge: Family;Available PRN/intermittently Type of Home: House Home Access: Level entry     Home Layout: One level     Bathroom Shower/Tub: Teacher, early years/pre: Standard Bathroom Accessibility: Yes   Home Equipment: Environmental consultant - 2 wheels;Cane - single point;Grab bars - tub/shower;Tub bench   Additional Comments: daughter can stay with pt as needed      Prior Functioning/Environment Level of Independence: Independent with assistive device(s)        Comments: From pt and chart--uses cane, family taks him to appts (does not drive). Could make small meals for himself. Reports ordering take-out recently 2/2 functional decline.         OT Problem List: Decreased strength;Decreased activity tolerance;Impaired balance (sitting and/or standing);Decreased cognition;Decreased coordination;Decreased knowledge of use of DME or AE;Decreased knowledge of precautions;Pain      OT Treatment/Interventions: Self-care/ADL training;Therapeutic exercise;Energy conservation;DME and/or AE instruction;Neuromuscular education;Therapeutic activities;Patient/family education;Balance training;Cognitive remediation/compensation    OT Goals(Current goals can be found in the care plan section) Acute Rehab OT Goals Patient Stated Goal: move better, with less assist needed OT Goal Formulation: With patient Time For Goal Achievement: 04/07/21 Potential to Achieve Goals: Good ADL Goals Pt Will Perform Grooming: sitting;with mod assist Pt Will Perform Upper Body Bathing: with mod assist;sitting Pt Will Perform Lower Body Bathing: with max assist;sitting/lateral leans Pt Will Transfer to Toilet: squat pivot transfer;stand pivot transfer;with +2 assist;with mod assist;bedside commode Additional ADL Goal #1: Pt will complete bed mobility at mod A level to prepare for EOB/OOB ADLs.  OT Frequency: Min 2X/week   Barriers to D/C:            Co-evaluation PT/OT/SLP Co-Evaluation/Treatment: Yes Reason for Co-Treatment: For patient/therapist safety PT goals addressed during session: Mobility/safety with mobility OT goals addressed during session: Strengthening/ROM;ADL's and self-care;Proper use of Adaptive equipment and DME      AM-PAC OT "6 Clicks" Daily Activity     Outcome Measure Help from another person eating meals?: A Little Help from another person taking care of personal grooming?: A Lot Help from another person toileting, which includes using  toliet, bedpan, or urinal?: Total Help from another person bathing (including washing, rinsing, drying)?: Total Help from another person to put on and taking off regular upper body clothing?:  A Lot Help from another person to put on and taking off regular lower body clothing?: Total 6 Click Score: 10   End of Session Equipment Utilized During Treatment: Gait belt;Rolling Geffre  Activity Tolerance: Patient limited by fatigue;Patient tolerated treatment well Patient left: in chair;with call bell/phone within reach;with chair alarm set  OT Visit Diagnosis: Unsteadiness on feet (R26.81);Muscle weakness (generalized) (M62.81);Pain;Other abnormalities of gait and mobility (R26.89);Other symptoms and signs involving cognitive function                Time: 9597-4718 OT Time Calculation (min): 27 min Charges:  OT General Charges $OT Visit: 1 Visit OT Evaluation $OT Eval Moderate Complexity: [place], OT Acute Rehabilitation Services Pager: [phone] Office: [phone]   Hortencia Pilar 03/17/2021, 2:23 PM

## 2021-03-17 NOTE — Progress Notes (Signed)
At 1915, it was reported to writer from previous shift that throughout the shift patient had been anxious, continuously moving, restless, confused, and pulled out two IV lines earlier.  Upon entering room, patient in bed, restless, had soft mittens applied to both hands, attempting to pull self up and pull at cords. At 2000 patient attempted to climb out of the bed, to "open the door," however, the door to the room was already open. It was also noted patient starting intently into areas of the room as if seeing things that were not there. He did state the lights in the room were bothersome and wanted them turned off. Patient was also reporting sounds that were not there, such as a phone ringing and a noise "coming at him" from behind the TV that was not from the TV.  Patient continued to be restless with constant fidgeting and attempted on two more occassions to crawl out of bed, setting off the alarms. He was alert to self but not to situation or time.  Patient with history of significant alcohol abuse. MD was paged, and CIWA ordered. Patient scored 13 on first CIWA assessment, was treated with notable improvement. Continue to monitor.

## 2021-03-17 NOTE — Evaluation (Signed)
Physical Therapy Evaluation Patient Details Name: Seth [name] MRN: [id number] DOB: 16-Jan-1956 Today's Date: 03/17/2021   History of Present Illness  pt is a 65 y/o male admitted 7/17 after 3 witnessed seizures at home; found to be unresponsive by EMS.  It is unclear how long the patiet was down.  On arrival pt was urgently intubated in the ED.  7/19 extubated, on LTM, seizures have resolved.  PMHx:  stroke/SDH, HTN, gout, admission in may for SDH  Clinical Impression  Pt admitted with/for unresponsiveness post 3 witnessed seizures.  Pt with slowed cognition, significantly uncoordinated in his mobility, needing mod/max of 2 persons for assist to EOB and to the chair.  Pt currently limited functionally due to the problems listed. ( See problems list.)   Pt will benefit from PT to maximize function and safety in order to get ready for next venue listed below.     Follow Up Recommendations SNF;Supervision/Assistance - 24 hour    Equipment Recommendations  None recommended by PT    Recommendations for Other Services       Precautions / Restrictions Precautions Precautions: Fall      Mobility  Bed Mobility Overal bed mobility: Needs Assistance Bed Mobility: Supine to Sit           General bed mobility comments: cues for sequencing and staying focused on task.  significant truncal assist forward and use of the pad to help advance pelvis forward toward EOB.  Need for consistent help staying forward in midline.    Transfers Overall transfer level: Needs assistance Equipment used: Rolling [employer]ler (2 wheeled) Transfers: Sit to/from [facility] Sit to Stand: Max assist;+2 physical assistance Stand pivot transfers: Max assist;+2 physical assistance;+2 safety/equipment       General transfer comment: cues for hand placement and for w/shifting foward over his feet/BOS.  Assist translating weight forward with some boost into the RW.  Removal of the RW to get a more  face to face positioning to assist with pivot to the chair due to pt's significant bias posteriorly.  Ambulation/Gait             General Gait Details: unable  Stairs            Wheelchair Mobility    Modified Rankin (Stroke Patients Only)       Balance Overall balance assessment: Needs assistance Sitting-balance support: Bilateral upper extremity supported;Feet supported Sitting balance-[name] Scale: Poor Sitting balance - Comments: heaving posterior bias that bil UE can't fully control without external assist   Standing balance support: Bilateral upper extremity supported;Single extremity supported;During functional activity Standing balance-[name] Scale: Zero Standing balance comment: posterior bias with reliance on external assist to come forward over his BOS                             Pertinent Vitals/Pain Pain Assessment: Faces Faces Pain Scale: No hurt Pain Intervention(s): Monitored during session    Home Living Family/patient expects to be discharged to:: Private residence Living Arrangements: Alone Available Help at Discharge: Family;Available PRN/intermittently Type of Home: House Home Access: Level entry     Home Layout: One level Home Equipment: [place] - 2 wheels;Cane - single point;Grab bars - tub/shower;Tub bench Additional Comments: daughter can stay with pt as needed    Prior Function           Comments: From pt and chart--uses cane, family taks him to appts (does not drive). Could  make small meals for himself. Reports ordering take-out recently 2/2 functional decline.     Hand Dominance   Dominant Hand: Right    Extremity/Trunk Assessment   Upper Extremity Assessment Upper Extremity Assessment: Defer to OT evaluation    Lower Extremity Assessment Lower Extremity Assessment: Generalized weakness;RLE deficits/detail;LLE deficits/detail RLE Deficits / Details: moves against gravity, not well coordinated.  In stance pt  unable to w/bear fully RLE Coordination: decreased fine motor LLE Deficits / Details: uncoordinated movement, assist to move off the bed, weakness with w/bearing in stance. LLE Coordination: decreased fine motor       Communication   Communication: No difficulties  Cognition Arousal/Alertness: Awake/alert Behavior During Therapy: Flat affect Overall Cognitive Status: Impaired/Different from baseline Area of Impairment: Orientation;Attention;Safety/judgement;Awareness;Problem solving                 Orientation Level: Situation Current Attention Level: Sustained;Focused     Safety/Judgement: Decreased awareness of safety;Decreased awareness of deficits Awareness: Intellectual Problem Solving: Slow processing;Decreased initiation;Requires verbal cues;Requires tactile cues        General Comments      Exercises     Assessment/Plan    PT Assessment Patient needs continued PT services  PT Problem List Decreased strength;Decreased activity tolerance;Decreased balance;Decreased mobility;Decreased coordination;Decreased knowledge of use of DME;Decreased knowledge of precautions       PT Treatment Interventions DME instruction;Gait training;Functional mobility training;Therapeutic activities;Balance training;Neuromuscular re-education;Patient/family education    PT Goals (Current goals can be found in the Care Plan section)  Acute Rehab PT Goals Patient Stated Goal: pt did not participate. PT Goal Formulation: Patient unable to participate in goal setting Time For Goal Achievement: 03/31/21 Potential to Achieve Goals: Fair    Frequency Min 3X/week   Barriers to discharge        Co-evaluation PT/OT/SLP Co-Evaluation/Treatment: Yes Reason for Co-Treatment: For patient/therapist safety PT goals addressed during session: Mobility/safety with mobility         AM-PAC PT "6 Clicks" Mobility  Outcome Measure Help needed turning from your back to your side while in  a flat bed without using bedrails?: A Lot Help needed moving from lying on your back to sitting on the side of a flat bed without using bedrails?: A Lot Help needed moving to and from a bed to a chair (including a wheelchair)?: Total Help needed standing up from a chair using your arms (e.g., wheelchair or bedside chair)?: Total Help needed to walk in hospital room?: Total Help needed climbing 3-5 steps with a railing? : Total 6 Click Score: 8    End of Session   Activity Tolerance: Patient tolerated treatment well;Patient limited by fatigue Patient left: in chair;with call bell/phone within reach;with chair alarm set Nurse Communication: Mobility status PT Visit Diagnosis: Unsteadiness on feet (R26.81);Other abnormalities of gait and mobility (R26.89);Muscle weakness (generalized) (M62.81);Difficulty in walking, not elsewhere classified (R26.2);Other symptoms and signs involving the nervous system (R29.898)    Time: [id number] PT Time Calculation (min) (ACUTE ONLY): 27 min   Charges:   PT Evaluation $PT Eval Moderate Complexity: 1 Mod          03/17/2021  Ginger Carne., PT Acute Rehabilitation Services [phone]  (pager) [phone]  (office)  Tessie Fass Chandler Stofer 03/17/2021, 2:21 PM

## 2021-03-18 DIAGNOSIS — N179 Acute kidney failure, unspecified: Secondary | ICD-10-CM | POA: Diagnosis not present

## 2021-03-18 DIAGNOSIS — I472 Ventricular tachycardia: Secondary | ICD-10-CM | POA: Diagnosis not present

## 2021-03-18 LAB — GLUCOSE, CAPILLARY
Glucose-Capillary: 80 mg/dL (ref 70–99)
Glucose-Capillary: 82 mg/dL (ref 70–99)
Glucose-Capillary: 79 mg/dL (ref 70–99)
Glucose-Capillary: 124 mg/dL — ABNORMAL HIGH (ref 70–99)
Glucose-Capillary: 80 mg/dL (ref 70–99)

## 2021-03-18 LAB — LEVETIRACETAM LEVEL: Levetiracetam Lvl: 62.4 ug/mL — ABNORMAL HIGH (ref 10.0–40.0)

## 2021-03-18 MED ORDER — PANTOPRAZOLE SODIUM 40 MG PO TBEC
40.0000 mg | DELAYED_RELEASE_TABLET | Freq: Every day | ORAL | Status: DC
  Administered 2021-03-18 – 2021-03-22 (×5): 40 mg via ORAL
  Filled 2021-03-18 (×5): qty 1

## 2021-03-18 MED ORDER — FLUTICASONE FUROATE-VILANTEROL 100-25 MCG/INH IN AEPB
1.0000 | INHALATION_SPRAY | Freq: Every day | RESPIRATORY_TRACT | Status: DC
  Administered 2021-03-19 – 2021-03-22 (×4): 1 via RESPIRATORY_TRACT
  Filled 2021-03-18: qty 28

## 2021-03-18 MED ORDER — ASPIRIN EC 81 MG PO TBEC
81.0000 mg | DELAYED_RELEASE_TABLET | Freq: Every day | ORAL | Status: DC
  Administered 2021-03-18 – 2021-03-22 (×5): 81 mg via ORAL
  Filled 2021-03-18 (×5): qty 1

## 2021-03-18 MED ORDER — UMECLIDINIUM BROMIDE 62.5 MCG/INH IN AEPB
1.0000 | INHALATION_SPRAY | Freq: Every day | RESPIRATORY_TRACT | Status: DC
  Administered 2021-03-19 – 2021-03-22 (×4): 1 via RESPIRATORY_TRACT
  Filled 2021-03-18: qty 7

## 2021-03-18 MED ORDER — ORAL CARE MOUTH RINSE
15.0000 mL | Freq: Two times a day (BID) | OROMUCOSAL | Status: DC
  Administered 2021-03-19 – 2021-03-22 (×5): 15 mL via OROMUCOSAL

## 2021-03-18 MED ORDER — METOPROLOL TARTRATE 50 MG PO TABS
50.0000 mg | ORAL_TABLET | Freq: Two times a day (BID) | ORAL | Status: DC
  Administered 2021-03-18 – 2021-03-22 (×9): 50 mg via ORAL
  Filled 2021-03-18 (×9): qty 1

## 2021-03-18 MED ORDER — ROSUVASTATIN CALCIUM 5 MG PO TABS
10.0000 mg | ORAL_TABLET | Freq: Every day | ORAL | Status: DC
  Administered 2021-03-18 – 2021-03-22 (×5): 10 mg via ORAL
  Filled 2021-03-18 (×5): qty 2

## 2021-03-18 NOTE — NC FL2 (Signed)
[place] LEVEL OF CARE SCREENING TOOL     IDENTIFICATION  Patient Name: Seth [name] Birthdate: 05-18-1956 Sex: male Admission Date (Current Location): 03/13/2021  [facility] and [place] Number:  Herbalist and Address:  [facility], [place] [address] [place], [place] [address]      Provider Number: [id number]  Attending Physician Name and Address:  Kathie Dike, MD  Relative Name and Phone Number:       Current Level of Care: Hospital Recommended Level of Care: [place] Prior Approval Number:    Date Approved/Denied:   PASRR Number: [id number]  Discharge Plan: SNF    Current Diagnoses: Patient Active Problem List   Diagnosis Date Noted   Pressure injury of skin 03/14/2021   Seizure ([place]) 03/13/2021   SDH (subdural hematoma) (HCC)    Encephalopathy acute    Protein-calorie malnutrition, severe 01/11/2021   Benign essential HTN    Benign prostatic hyperplasia    Depression    ETOH abuse    Hyponatremia    Nonsustained ventricular tachycardia ([place])    Alcohol use 01/01/2021   History of CVA (cerebrovascular accident) 01/01/2021   Sepsis ([place]) 09/14/2020   COVID-19 virus infection 09/14/2020   Osteomyelitis of left foot ([place])    AKI (acute kidney injury) ([place]) 22/29/7989   Metabolic acidosis 21/19/4174   Sinus tachycardia 05/31/2019   DOE (dyspnea on exertion) 05/31/2019   Generalized weakness 02/19/2019   Hypertensive urgency 02/19/2019   Left fibular fracture 02/19/2019   Chronic alcoholism ([place]) 02/19/2019   Malnutrition of moderate degree 08/25/2017   Acute respiratory failure with hypoxia ([place]) 08/23/2017   Chest pain 08/23/2017   Elevated blood pressure reading 08/23/2017   Tobacco abuse 08/23/2017   COPD (chronic obstructive pulmonary disease) ([place]) 08/23/2017    Orientation RESPIRATION BLADDER Height & Weight     Self  Normal Incontinent Weight: 145 lb 8.1 oz (66 kg) Height:  6'  3" (190.5 cm)  BEHAVIORAL SYMPTOMS/MOOD NEUROLOGICAL BOWEL NUTRITION STATUS      Incontinent Diet (See DC summary)  AMBULATORY STATUS COMMUNICATION OF NEEDS Skin   Extensive Assist Verbally PU Stage and Appropriate Care ([place])                       Personal Care Assistance Level of Assistance  Bathing, Feeding, Dressing Bathing Assistance: Maximum assistance Feeding assistance: Maximum assistance Dressing Assistance: Maximum assistance     Functional Limitations Info  Sight, Hearing, Speech Sight Info: Adequate Hearing Info: Adequate Speech Info: Adequate    SPECIAL CARE FACTORS FREQUENCY  PT (By licensed PT), OT (By licensed OT)     PT Frequency: 5x a week OT Frequency: 5x a week            Contractures      Additional Factors Info  Code Status, Allergies Code Status Info: Full Allergies Info: NKA           Current Medications (03/18/2021):  This is the current hospital active medication list Current Facility-Administered Medications  Medication Dose Route Frequency Provider Last Rate Last Admin   0.9 %  sodium chloride infusion   Intra-arterial PRN Drenda Freeze, MD       acetaminophen (TYLENOL) tablet 650 mg  650 mg Oral Q4H PRN Shellia Cleverly, MD   650 mg at 03/17/21 1526   aspirin EC tablet 81 mg  81 mg Oral Daily Kathie Dike, MD  chlorhexidine gluconate (MEDLINE KIT) (PERIDEX) 0.12 % solution 15 mL  15 mL Mouth Rinse BID Shellia Cleverly, MD   15 mL at 03/18/21 0830   Chlorhexidine Gluconate Cloth 2 % PADS 6 each  6 each Topical Daily Shellia Cleverly, MD   6 each at 03/17/21 1045   docusate sodium (COLACE) capsule 100 mg  100 mg Oral BID PRN Shellia Cleverly, MD       enoxaparin (LOVENOX) injection 40 mg  40 mg Subcutaneous Q24H Shellia Cleverly, MD   40 mg at 03/18/21 1121   feeding supplement (ENSURE ENLIVE / ENSURE PLUS) liquid 237 mL  237 mL Oral BID BM Chand, Sudham, MD   237 mL at 03/18/21 1000   fluticasone furoate-vilanterol  (BREO ELLIPTA) 100-25 MCG/INH 1 puff  1 puff Inhalation Daily Memon, Jolaine Artist, MD       folic acid (FOLVITE) tablet 1 mg  1 mg Oral Daily Memon, Jolaine Artist, MD   1 mg at 03/18/21 1122   hydrALAZINE (APRESOLINE) injection 10 mg  10 mg Intravenous Q4H PRN Anders Simmonds, MD   10 mg at 03/16/21 1943   insulin aspart (novoLOG) injection 0-15 Units  0-15 Units Subcutaneous Q4H Desai, Rahul P, PA-C   2 Units at 03/14/21 2000   ipratropium-albuterol (DUONEB) 0.5-2.5 (3) MG/3ML nebulizer solution 3 mL  3 mL Nebulization Q6H PRN Jacky Kindle, MD       levETIRAcetam (KEPPRA) tablet 1,500 mg  1,500 mg Oral BID Kathie Dike, MD   1,500 mg at 03/18/21 1121   loratadine (CLARITIN) tablet 10 mg  10 mg Oral Daily Kathie Dike, MD   10 mg at 03/18/21 1122   LORazepam (ATIVAN) tablet 1-4 mg  1-4 mg Oral Q1H PRN Shela Leff, MD   1 mg at 03/17/21 1042   Or   LORazepam (ATIVAN) injection 1-4 mg  1-4 mg Intravenous Q1H PRN Shela Leff, MD   1 mg at 03/17/21 0115   magnesium oxide (MAG-OX) tablet 400 mg  400 mg Oral Daily Kathie Dike, MD   400 mg at 03/18/21 1123   MEDLINE mouth rinse  15 mL Mouth Rinse 10 times per day Shellia Cleverly, MD   15 mL at 03/18/21 0626   metoprolol tartrate (LOPRESSOR) tablet 50 mg  50 mg Oral BID Kathie Dike, MD       multivitamin with minerals tablet 1 tablet  1 tablet Oral Daily Shela Leff, MD   1 tablet at 03/18/21 1122   ondansetron (ZOFRAN) injection 4 mg  4 mg Intravenous Q6H PRN Shellia Cleverly, MD       pantoprazole (PROTONIX) EC tablet 40 mg  40 mg Oral Daily Memon, Jolaine Artist, MD       polyethylene glycol (MIRALAX / GLYCOLAX) packet 17 g  17 g Oral Daily PRN Shellia Cleverly, MD       potassium chloride (KLOR-CON) packet 40 mEq  40 mEq Oral Daily Rozann Lesches, RPH   40 mEq at 03/18/21 1119   rosuvastatin (CRESTOR) tablet 10 mg  10 mg Oral Daily Kathie Dike, MD       thiamine tablet 100 mg  100 mg Oral Daily Kathie Dike, MD   100 mg  at 03/18/21 1123   umeclidinium bromide (INCRUSE ELLIPTA) 62.5 MCG/INH 1 puff  1 puff Inhalation Daily Kathie Dike, MD         Discharge Medications: Please see discharge summary for a list of discharge medications.  Relevant Imaging Results:  Relevant Lab Results:   Additional Information SS# [id number]  Emeterio Reeve, [place]

## 2021-03-18 NOTE — Progress Notes (Signed)
PROGRESS NOTE    KONRAD HOAK  ATF:[id number] DOB: 08/26/1956 DOA: 03/13/2021 PCP: Lucianne Lei, MD    Brief Narrative:  65 year old male with history of prior subdural hematoma, admitted with recurrent seizures requiring endotracheal intubation for airway protection.  He was also noted to have nonsustained V. tach.  He was admitted to the ICU, Keppra dose was increased and he was followed by neurology.  Seizures have since resolved.  He was also seen by cardiology and started on amiodarone.  Overall heart rhythm has stabilized.  Transferred to [facility] care on 7/20   Assessment & Plan:   Active Problems:   AKI (acute kidney injury) ([place])   Nonsustained ventricular tachycardia (HCC)   Encephalopathy acute   Seizure (HCC)   Pressure injury of skin   Recurrent seizures in the setting of recent subdural hematoma -Patient was admitted in May 2022 for subdural hematoma that was managed nonsurgically.  He was noted to have seizures at that time and was discharged on keppra -Admitted with recurrent seizures -Seen by neurology and Keppra dose has been increased to 1546m bid -On LTM, further seizures have resolved  Nonsustained ventricular tachycardia -Was started on amiodarone per cardiology, now discontinued -no further ectopy or NSVT noted -30 day event monitor arranged by cardiology -Keep potassium greater than 4, magnesium greater than 2  Acute kidney injury -Creatinine 1.8 on arrival, likely related to volume depletion -Improved with IV hydration to 1.0  Respiratory failure secondary to recurrent seizures -Was briefly intubated for airway management -Extubated on 7/19 and currently on room air  Disposition -PT/OT -Seen by PT with recs for SNF placement, patient agreeable  HTN -restarted on metoprolol -family said he was not taking meds consistently at home  HLD -continue on statin  COPD -no shortness of breath or wheezing -continue on Breo-Ellipta and  Incruse  History of CVA History of PAD s/p DES to left popliteal artery 08/2020 -continue asa and statin -was supposed to be taking plavix as well, but this was discontinued in light of recent SDH -patient was not taking this at home anyways   DVT prophylaxis: enoxaparin (LOVENOX) injection 40 mg Start: 03/14/21 1000 SCDs Start: 03/13/21 2352  Code Status: Full code Family Communication: Updated patient's sister KVarney Bilesover the phone Disposition Plan: Status is: Inpatient  Remains inpatient appropriate because:Ongoing diagnostic testing needed not appropriate for outpatient work up, IV treatments appropriate due to intensity of illness or inability to take PO, and Inpatient level of care appropriate due to severity of illness  Dispo: The patient is from: Home              Anticipated d/c is to: SNF              Patient currently is medically stable to d/c.   Difficult to place patient No         Consultants:  Neurology Cardiology PCCM  Procedures:  ETT 7/17 > 7/19  Antimicrobials:      Subjective: Sitting up in bed. He is aware he is in the hospital. Denies any complaints  Objective: Vitals:   03/18/21 0248 03/18/21 0256 03/18/21 0757 03/18/21 1122  BP: (!) 170/93  (!) 149/92 119/72  Pulse: 100  84 97  Resp: 19  19   Temp: 98.4 F (36.9 C)     TempSrc: Oral     SpO2: 97%     Weight:  66 kg    Height:        Intake/Output Summary (Last  24 hours) at 03/18/2021 1233 Last data filed at 03/18/2021 0249 Gross per 24 hour  Intake --  Output 725 ml  Net -725 ml   Filed Weights   03/13/21 2348 03/16/21 0417 03/18/21 0256  Weight: 63.5 kg 66.9 kg 66 kg    Examination:  General exam: Alert, awake, no distress Respiratory system: Clear to auscultation. Respiratory effort normal. Cardiovascular system:RRR. No murmurs, rubs, gallops. Gastrointestinal system: Abdomen is nondistended, soft and nontender. No organomegaly or masses felt. Normal bowel sounds  heard. Central nervous system: No focal neurological deficits. Extremities: left foot TMA Skin: No rashes, lesions or ulcers Psychiatry: less confused today     Data Reviewed: I have personally reviewed following labs and imaging studies  CBC: Recent Labs  Lab 03/13/21 2229 03/13/21 2233 03/14/21 0127 03/14/21 0316 03/14/21 0617 03/15/21 0527 03/16/21 0113  WBC 15.4*  --  8.5  --  6.9 7.6 8.2  HGB 11.6*   < > 11.1* 11.9* 10.3* 8.7* 9.1*  HCT 40.0   < > 33.5* 35.0* 32.3* 26.3* 28.7*  MCV 100.8*  --  87.9  --  89.0 88.3 92.0  PLT 228  --  168  --  170 130* 124*   < > = values in this interval not displayed.   Basic Metabolic Panel: Recent Labs  Lab 03/13/21 2229 03/13/21 2233 03/14/21 0002 03/14/21 0127 03/14/21 0316 03/14/21 0617 03/15/21 0527 03/16/21 0113 03/17/21 0511  NA 137 136   < >  --  132* 134* 136 137 134*  K 3.8 4.5   < >  --  3.5 3.5 3.4* 3.6 3.9  CL 95* 106  --   --   --  95* 101 104 99  CO2 9*  --   --   --   --  21* '26 23 22  ' GLUCOSE 179* 172*  --   --   --  283* 119* 74 109*  BUN 16 15  --   --   --  '12 15 13 9  ' CREATININE 1.81* 1.40*  --  1.27*  --  1.63* 1.59* 1.36* 1.04  CALCIUM 10.2  --   --   --   --  8.6* 8.6* 9.0 9.1  MG 3.9*  --   --   --   --  2.7* 2.3 1.8 1.9  PHOS 11.4*  --   --   --   --  6.6* 3.5 2.3* 4.1   < > = values in this interval not displayed.   GFR: Estimated Creatinine Clearance: 67.9 mL/min (by C-G formula based on SCr of 1.04 mg/dL). Liver Function Tests: Recent Labs  Lab 03/13/21 2229 03/17/21 0511  AST 54*  --   ALT 17  --   ALKPHOS 95  --   BILITOT 1.6*  --   PROT 8.0  --   ALBUMIN 4.3 2.9*   No results for input(s): LIPASE, AMYLASE in the last 168 hours. No results for input(s): AMMONIA in the last 168 hours. Coagulation Profile: No results for input(s): INR, PROTIME in the last 168 hours. Cardiac Enzymes: No results for input(s): CKTOTAL, CKMB, CKMBINDEX, TROPONINI in the last 168 hours. BNP (last 3  results) No results for input(s): PROBNP in the last 8760 hours. HbA1C: No results for input(s): HGBA1C in the last 72 hours.  CBG: Recent Labs  Lab 03/17/21 1916 03/17/21 2346 03/18/21 0256 03/18/21 0921 03/18/21 1040  GLUCAP 71 81 80 82 79   Lipid Profile: No results for  input(s): CHOL, HDL, LDLCALC, TRIG, CHOLHDL, LDLDIRECT in the last 72 hours. Thyroid Function Tests: No results for input(s): TSH, T4TOTAL, FREET4, T3FREE, THYROIDAB in the last 72 hours. Anemia Panel: No results for input(s): VITAMINB12, FOLATE, FERRITIN, TIBC, IRON, RETICCTPCT in the last 72 hours. Sepsis Labs: No results for input(s): PROCALCITON, LATICACIDVEN in the last 168 hours.  Recent Results (from the past 240 hour(s))  Resp Panel by RT-PCR (Flu A&B, Covid) Nasopharyngeal Swab     Status: None   Collection Time: 03/13/21 10:32 PM   Specimen: Nasopharyngeal Swab; Nasopharyngeal(NP) swabs in vial transport medium  Result Value Ref Range Status   SARS Coronavirus 2 by RT PCR NEGATIVE NEGATIVE Final    Comment: (NOTE) SARS-CoV-2 target nucleic acids are NOT DETECTED.  The SARS-CoV-2 RNA is generally detectable in upper respiratory specimens during the acute phase of infection. The lowest concentration of SARS-CoV-2 viral copies this assay can detect is 138 copies/mL. A negative result does not preclude SARS-Cov-2 infection and should not be used as the sole basis for treatment or other patient management decisions. A negative result may occur with  improper specimen collection/handling, submission of specimen other than nasopharyngeal swab, presence of viral mutation(s) within the areas targeted by this assay, and inadequate number of viral copies(<138 copies/mL). A negative result must be combined with clinical observations, patient history, and epidemiological information. The expected result is Negative.  Fact Sheet for Patients:  EntrepreneurPulse.com.au  Fact Sheet for  Healthcare Providers:  IncredibleEmployment.be  This test is no t yet approved or cleared by the [place] FDA and  has been authorized for detection and/or diagnosis of SARS-CoV-2 by FDA under an Emergency Use Authorization (EUA). This EUA will remain  in effect (meaning this test can be used) for the duration of the COVID-19 declaration under Section 564(b)(1) of the Act, 21 U.S.C.section 360bbb-3(b)(1), unless the authorization is terminated  or revoked sooner.       Influenza A by PCR NEGATIVE NEGATIVE Final   Influenza B by PCR NEGATIVE NEGATIVE Final    Comment: (NOTE) The Xpert Xpress SARS-CoV-2/FLU/RSV plus assay is intended as an aid in the diagnosis of influenza from Nasopharyngeal swab specimens and should not be used as a sole basis for treatment. Nasal washings and aspirates are unacceptable for Xpert Xpress SARS-CoV-2/FLU/RSV testing.  Fact Sheet for Patients: EntrepreneurPulse.com.au  Fact Sheet for Healthcare Providers: IncredibleEmployment.be  This test is not yet approved or cleared by the [place] FDA and has been authorized for detection and/or diagnosis of SARS-CoV-2 by FDA under an Emergency Use Authorization (EUA). This EUA will remain in effect (meaning this test can be used) for the duration of the COVID-19 declaration under Section 564(b)(1) of the Act, 21 U.S.C. section 360bbb-3(b)(1), unless the authorization is terminated or revoked.  Performed at [facility], [place] [address] [place], [place] [address]   MRSA Next Gen by PCR, Nasal     Status: None   Collection Time: 03/14/21  1:28 AM   Specimen: Nasal Mucosa; Nasal Swab  Result Value Ref Range Status   MRSA by PCR Next Gen NOT DETECTED NOT DETECTED Final    Comment: (NOTE) The GeneXpert MRSA Assay (FDA approved for NASAL specimens only), is one component of a comprehensive MRSA colonization surveillance program. It is not  intended to diagnose MRSA infection nor to guide or monitor treatment for MRSA infections. Test performance is not FDA approved in patients less than 23 years old. Performed at [facility],  [address], [place], [place] [address]          Radiology Studies: No results found.      Scheduled Meds:  chlorhexidine gluconate (MEDLINE KIT)  15 mL Mouth Rinse BID   Chlorhexidine Gluconate Cloth  6 each Topical Daily   enoxaparin (LOVENOX) injection  40 mg Subcutaneous Q24H   feeding supplement  237 mL Oral BID BM   folic acid  1 mg Oral Daily   insulin aspart  0-15 Units Subcutaneous Q4H   levETIRAcetam  1,500 mg Oral BID   loratadine  10 mg Oral Daily   magnesium oxide  400 mg Oral Daily   mouth rinse  15 mL Mouth Rinse 10 times per day   metoprolol tartrate  50 mg Oral BID   multivitamin with minerals  1 tablet Oral Daily   potassium chloride  40 mEq Oral Daily   thiamine  100 mg Oral Daily   Continuous Infusions:  sodium chloride       LOS: 5 days    Time spent: 54mns    JKathie Dike MD [facility]   If 7PM-7AM, please contact night-coverage [email or web address]  03/18/2021, 12:33 PM

## 2021-03-18 NOTE — Plan of Care (Signed)
  Problem: Clinical Measurements: Goal: Ability to maintain clinical measurements within normal limits will improve Outcome: Progressing Goal: Will remain free from infection Outcome: Progressing Goal: Respiratory complications will improve Outcome: Progressing Goal: Cardiovascular complication will be avoided Outcome: Progressing   

## 2021-03-18 NOTE — TOC Initial Note (Addendum)
Transition of Care [facility]) - Initial/Assessment Note    Patient Details  Name: Seth [name] MRN: [id number] Date of Birth: 1955-12-01  Transition of Care [facility]) CM/SW Contact:    Tresa Endo Phone Number: 03/18/2021, 3:13 PM  Clinical Narrative:                 CSW received SNF consult. CSW met with pt at bedside. Pt asked CSW to speak with his sister [name] because he was not sure on about what CSW was asking. CSW contacted pt sister [name] to discuss SNF and what the process was. CSW introduced self and explained role at the hospital. Pt reports that PTA the pt lived at home alone. PT reports that pt overall level of mobility/needs assistance. Pt used 2 wheeled rolling [place], needs max assist with 2+ guard. PT reports pt's daughter can stay with him as needed.  CSW reviewed PT/OT recommendations for SNF. Pt reports is okay with going to a Snf for rehab, pt sister is states pt cannot go back home alone and will need LTC. Pt and pt sister [name] gave CSW permission to fax out to facilities in the area. Pt has no preference of facility at this time. CSW gave pt medicare.gov rating list to review. PT reports they are covid negative with 3 covid vaccination and is willing to get the fourth per pt sister request.    CSW will continue to follow.    Expected Discharge Plan: Skilled Nursing Facility Barriers to Discharge: Continued Medical Work up   Patient Goals and CMS Choice   CMS Medicare.gov Compare Post Acute Care list provided to:: Patient Choice offered to / list presented to : Patient  Expected Discharge Plan and Services Expected Discharge Plan: [place] In-house Referral: Clinical Social Work   Post Acute Care Choice: [place] Living arrangements for the past 2 months: [place]                                      Prior Living Arrangements/Services Living arrangements for the past 2 months: Single Family  Home Lives with:: Self Patient language and need for interpreter reviewed:: Yes Do you feel safe going back to the place where you live?: Yes      Need for Family Participation in Patient Care: Yes (Comment) Care giver support system in place?: Yes (comment)   Criminal Activity/Legal Involvement Pertinent to Current Situation/Hospitalization: No - Comment as needed  Activities of Daily Living      Permission Sought/Granted Permission sought to share information with : Family Supports Permission granted to share information with : Yes, Verbal Permission Granted  Share Information with NAME: Seann, Genther  Permission granted to share info w AGENCY: SNF of choice  Permission granted to share info w Relationship: (Sister)  Permission granted to share info w Contact Information: [phone]  Emotional Assessment Appearance:: Appears stated age Attitude/Demeanor/Rapport: Engaged Affect (typically observed): Pleasant Orientation: : Oriented to Self Alcohol / Substance Use: Not Applicable Psych Involvement: No (comment)  Admission diagnosis:  Seizure ([place]) [R56.9] Ventricular tachycardia ([place]) [I47.2] Patient Active Problem List   Diagnosis Date Noted   Pressure injury of skin 03/14/2021   Seizure ([place]) 03/13/2021   SDH (subdural hematoma) ([place])    Encephalopathy acute    Protein-calorie malnutrition, severe 01/11/2021   Benign essential HTN    Benign prostatic hyperplasia  Depression    ETOH abuse    Hyponatremia    Nonsustained ventricular tachycardia (HCC)    Alcohol use 01/01/2021   History of CVA (cerebrovascular accident) 01/01/2021   Sepsis ([place]) 09/14/2020   COVID-19 virus infection 09/14/2020   Osteomyelitis of left foot ([place])    AKI (acute kidney injury) ([place]) 16/05/9603   Metabolic acidosis 54/04/8118   Sinus tachycardia 05/31/2019   DOE (dyspnea on exertion) 05/31/2019   Generalized weakness 02/19/2019   Hypertensive urgency 02/19/2019   Left fibular  fracture 02/19/2019   Chronic alcoholism ([place]) 02/19/2019   Malnutrition of moderate degree 08/25/2017   Acute respiratory failure with hypoxia ([place]) 08/23/2017   Chest pain 08/23/2017   Elevated blood pressure reading 08/23/2017   Tobacco abuse 08/23/2017   COPD (chronic obstructive pulmonary disease) ([place]) 08/23/2017   PCP:  Lucianne Lei, MD Pharmacy:   [place]), [place] - [address] [place]) [place] [address] Phone: [phone] Fax: [place], [place] - [address] [place] AT [facility] [address] [place] [address] Phone: [phone] Fax: [phone]  [id number] - [place], [place] - [address] AT [address] [place] [address] Phone: [phone] Fax: [place], [place] [address] Phone: [phone] Fax: [phone]     Social Determinants of Health ([place]) Interventions    Readmission Risk Interventions Readmission Risk Prevention Plan 06/04/2019  Transportation Screening Complete  PCP or Specialist Appt within 5-7 Days Complete  Home Care Screening Complete  Medication Review (RN CM) Complete  Some recent data might be hidden

## 2021-03-19 DIAGNOSIS — N179 Acute kidney failure, unspecified: Secondary | ICD-10-CM | POA: Diagnosis not present

## 2021-03-19 DIAGNOSIS — I472 Ventricular tachycardia: Secondary | ICD-10-CM | POA: Diagnosis not present

## 2021-03-19 LAB — GLUCOSE, CAPILLARY
Glucose-Capillary: 100 mg/dL — ABNORMAL HIGH (ref 70–99)
Glucose-Capillary: 137 mg/dL — ABNORMAL HIGH (ref 70–99)
Glucose-Capillary: 78 mg/dL (ref 70–99)
Glucose-Capillary: 83 mg/dL (ref 70–99)
Glucose-Capillary: 96 mg/dL (ref 70–99)
Glucose-Capillary: 98 mg/dL (ref 70–99)
Glucose-Capillary: 99 mg/dL (ref 70–99)

## 2021-03-19 NOTE — Progress Notes (Signed)
PROGRESS NOTE    Seth [name]  [id number] DOB: Sep 27, 1955 DOA: 03/13/2021 PCP: Lucianne Lei, MD    Brief Narrative:  65 year old male with history of prior subdural hematoma, admitted with recurrent seizures requiring endotracheal intubation for airway protection.  He was also noted to have nonsustained V. tach.  He was admitted to the ICU, Keppra dose was increased and he was followed by neurology.  Seizures have since resolved.  He was also seen by cardiology and started on amiodarone.  Overall heart rhythm has stabilized.  Transferred to [facility] care on 7/20   Assessment & Plan:   Active Problems:   AKI (acute kidney injury) ([place])   Nonsustained ventricular tachycardia (HCC)   Encephalopathy acute   Seizure (HCC)   Pressure injury of skin   Recurrent seizures in the setting of recent subdural hematoma -Patient was admitted in May 2022 for subdural hematoma that was managed nonsurgically.  He was noted to have seizures at that time and was discharged on keppra -Admitted with recurrent seizures -Seen by neurology and Keppra dose has been increased to '1500mg'$  bid -On LTM, further seizures have resolved  Nonsustained ventricular tachycardia -Was started on amiodarone per cardiology, now discontinued -no further ectopy or NSVT noted -30 day event monitor arranged by cardiology -Keep potassium greater than 4, magnesium greater than 2  Acute kidney injury -Creatinine 1.8 on arrival, likely related to volume depletion -Improved with IV hydration to 1.0  Respiratory failure secondary to recurrent seizures -Was briefly intubated for airway management -Extubated on 7/19 and currently on room air  Disposition -PT/OT -Seen by PT with recs for SNF placement, patient agreeable  HTN -restarted on metoprolol -family said he was not taking meds consistently at home -Blood pressure has been stable  HLD -continue on statin  COPD -no shortness of breath or wheezing -continue on  Breo-Ellipta and Incruse  History of CVA History of PAD s/p DES to left popliteal artery 08/2020 -continue asa and statin -was supposed to be taking plavix as well, but this was discontinued in light of recent SDH -patient was not taking this at home anyways   DVT prophylaxis: enoxaparin (LOVENOX) injection 40 mg Start: 03/14/21 1000 SCDs Start: 03/13/21 2352  Code Status: Full code Family Communication: Updated patient's sister [name] over the phone 7/22 Disposition Plan: Status is: Inpatient, medically stable for discharge to skilled nursing facility whenever bed can be arranged  Remains inpatient appropriate because:Ongoing diagnostic testing needed not appropriate for outpatient work up, IV treatments appropriate due to intensity of illness or inability to take PO, and Inpatient level of care appropriate due to severity of illness  Dispo: The patient is from: Home              Anticipated d/c is to: SNF              Patient currently is medically stable to d/c.   Difficult to place patient No         Consultants:  Neurology Cardiology PCCM  Procedures:  ETT 7/17 > 7/19  Antimicrobials:      Subjective: Currently sitting up in bed, does not have any new complaints.  He is wondering when he will go to rehab  Objective: Vitals:   03/19/21 1035 03/19/21 1153 03/19/21 1557 03/19/21 1944  BP: 125/81 125/81 137/84 129/79  Pulse: 95 94 86 90  Resp:  (!) '21 17 16  '$ Temp:  98.9 F (37.2 C) 98.5 F (36.9 C) 98.9 F (37.2 C)  TempSrc:  Oral Oral Oral  SpO2:  95%  98%  Weight:      Height:        Intake/Output Summary (Last 24 hours) at 03/19/2021 2013 Last data filed at 03/19/2021 1944 Gross per 24 hour  Intake 420 ml  Output 125 ml  Net 295 ml   Filed Weights   03/16/21 0417 03/18/21 0256 03/19/21 0435  Weight: 66.9 kg 66 kg 66.5 kg    Examination:  General exam: Alert, awake, no distress Respiratory system: Clear to auscultation. Respiratory effort  normal. Cardiovascular system:RRR. No murmurs, rubs, gallops.   Data Reviewed: I have personally reviewed following labs and imaging studies  CBC: Recent Labs  Lab 03/13/21 2229 03/13/21 2233 03/14/21 0127 03/14/21 0316 03/14/21 0617 03/15/21 0527 03/16/21 0113  WBC 15.4*  --  8.5  --  6.9 7.6 8.2  HGB 11.6*   < > 11.1* 11.9* 10.3* 8.7* 9.1*  HCT 40.0   < > 33.5* 35.0* 32.3* 26.3* 28.7*  MCV 100.8*  --  87.9  --  89.0 88.3 92.0  PLT 228  --  168  --  170 130* 124*   < > = values in this interval not displayed.   Basic Metabolic Panel: Recent Labs  Lab 03/13/21 2229 03/13/21 2233 03/14/21 0002 03/14/21 0127 03/14/21 0316 03/14/21 0617 03/15/21 0527 03/16/21 0113 03/17/21 0511  NA 137 136   < >  --  132* 134* 136 137 134*  K 3.8 4.5   < >  --  3.5 3.5 3.4* 3.6 3.9  CL 95* 106  --   --   --  95* 101 104 99  CO2 9*  --   --   --   --  21* '26 23 22  '$ GLUCOSE 179* 172*  --   --   --  283* 119* 74 109*  BUN 16 15  --   --   --  '12 15 13 9  '$ CREATININE 1.81* 1.40*  --  1.27*  --  1.63* 1.59* 1.36* 1.04  CALCIUM 10.2  --   --   --   --  8.6* 8.6* 9.0 9.1  MG 3.9*  --   --   --   --  2.7* 2.3 1.8 1.9  PHOS 11.4*  --   --   --   --  6.6* 3.5 2.3* 4.1   < > = values in this interval not displayed.   GFR: Estimated Creatinine Clearance: 68.4 mL/min (by C-G formula based on SCr of 1.04 mg/dL). Liver Function Tests: Recent Labs  Lab 03/13/21 2229 03/17/21 0511  AST 54*  --   ALT 17  --   ALKPHOS 95  --   BILITOT 1.6*  --   PROT 8.0  --   ALBUMIN 4.3 2.9*   No results for input(s): LIPASE, AMYLASE in the last 168 hours. No results for input(s): AMMONIA in the last 168 hours. Coagulation Profile: No results for input(s): INR, PROTIME in the last 168 hours. Cardiac Enzymes: No results for input(s): CKTOTAL, CKMB, CKMBINDEX, TROPONINI in the last 168 hours. BNP (last 3 results) No results for input(s): PROBNP in the last 8760 hours. HbA1C: No results for input(s):  HGBA1C in the last 72 hours.  CBG: Recent Labs  Lab 03/19/21 0403 03/19/21 0836 03/19/21 1124 03/19/21 1527 03/19/21 2004  GLUCAP 78 99 137* 100* 83   Lipid Profile: No results for input(s): CHOL, HDL, LDLCALC, TRIG, CHOLHDL, LDLDIRECT in the last  72 hours. Thyroid Function Tests: No results for input(s): TSH, T4TOTAL, FREET4, T3FREE, THYROIDAB in the last 72 hours. Anemia Panel: No results for input(s): VITAMINB12, FOLATE, FERRITIN, TIBC, IRON, RETICCTPCT in the last 72 hours. Sepsis Labs: No results for input(s): PROCALCITON, LATICACIDVEN in the last 168 hours.  Recent Results (from the past 240 hour(s))  Resp Panel by RT-PCR (Flu A&B, Covid) Nasopharyngeal Swab     Status: None   Collection Time: 03/13/21 10:32 PM   Specimen: Nasopharyngeal Swab; Nasopharyngeal(NP) swabs in vial transport medium  Result Value Ref Range Status   SARS Coronavirus 2 by RT PCR NEGATIVE NEGATIVE Final    Comment: (NOTE) SARS-CoV-2 target nucleic acids are NOT DETECTED.  The SARS-CoV-2 RNA is generally detectable in upper respiratory specimens during the acute phase of infection. The lowest concentration of SARS-CoV-2 viral copies this assay can detect is 138 copies/mL. A negative result does not preclude SARS-Cov-2 infection and should not be used as the sole basis for treatment or other patient management decisions. A negative result may occur with  improper specimen collection/handling, submission of specimen other than nasopharyngeal swab, presence of viral mutation(s) within the areas targeted by this assay, and inadequate number of viral copies(<138 copies/mL). A negative result must be combined with clinical observations, patient history, and epidemiological information. The expected result is Negative.  Fact Sheet for Patients:  EntrepreneurPulse.com.au  Fact Sheet for Healthcare Providers:  IncredibleEmployment.be  This test is no t yet  approved or cleared by the [place] FDA and  has been authorized for detection and/or diagnosis of SARS-CoV-2 by FDA under an Emergency Use Authorization (EUA). This EUA will remain  in effect (meaning this test can be used) for the duration of the COVID-19 declaration under Section 564(b)(1) of the Act, 21 U.S.C.section 360bbb-3(b)(1), unless the authorization is terminated  or revoked sooner.       Influenza A by PCR NEGATIVE NEGATIVE Final   Influenza B by PCR NEGATIVE NEGATIVE Final    Comment: (NOTE) The Xpert Xpress SARS-CoV-2/FLU/RSV plus assay is intended as an aid in the diagnosis of influenza from Nasopharyngeal swab specimens and should not be used as a sole basis for treatment. Nasal washings and aspirates are unacceptable for Xpert Xpress SARS-CoV-2/FLU/RSV testing.  Fact Sheet for Patients: EntrepreneurPulse.com.au  Fact Sheet for Healthcare Providers: IncredibleEmployment.be  This test is not yet approved or cleared by the [place] FDA and has been authorized for detection and/or diagnosis of SARS-CoV-2 by FDA under an Emergency Use Authorization (EUA). This EUA will remain in effect (meaning this test can be used) for the duration of the COVID-19 declaration under Section 564(b)(1) of the Act, 21 U.S.C. section 360bbb-3(b)(1), unless the authorization is terminated or revoked.  Performed at [facility], [place] [address] [place], [place] [address]   MRSA Next Gen by PCR, Nasal     Status: None   Collection Time: 03/14/21  1:28 AM   Specimen: Nasal Mucosa; Nasal Swab  Result Value Ref Range Status   MRSA by PCR Next Gen NOT DETECTED NOT DETECTED Final    Comment: (NOTE) The GeneXpert MRSA Assay (FDA approved for NASAL specimens only), is one component of a comprehensive MRSA colonization surveillance program. It is not intended to diagnose MRSA infection nor to guide or monitor treatment for MRSA  infections. Test performance is not FDA approved in patients less than 64 years old. Performed at [facility], [place] [address] [place], [place] [address]  Radiology Studies: No results found.      Scheduled Meds:  aspirin EC  81 mg Oral Daily   enoxaparin (LOVENOX) injection  40 mg Subcutaneous Q24H   feeding supplement  237 mL Oral BID BM   fluticasone furoate-vilanterol  1 puff Inhalation Daily   folic acid  1 mg Oral Daily   insulin aspart  0-15 Units Subcutaneous Q4H   levETIRAcetam  1,500 mg Oral BID   loratadine  10 mg Oral Daily   magnesium oxide  400 mg Oral Daily   mouth rinse  15 mL Mouth Rinse BID   metoprolol tartrate  50 mg Oral BID   multivitamin with minerals  1 tablet Oral Daily   pantoprazole  40 mg Oral Daily   rosuvastatin  10 mg Oral Daily   thiamine  100 mg Oral Daily   umeclidinium bromide  1 puff Inhalation Daily   Continuous Infusions:  sodium chloride       LOS: 6 days    Time spent: 5mns    JKathie Dike MD [facility]   If 7PM-7AM, please contact night-coverage [email or web address]  03/19/2021, 8:13 PM

## 2021-03-19 NOTE — TOC Progression Note (Signed)
Transition of Care [facility]) - Progression Note    Patient Details  Name: Seth [name] MRN: [id number] Date of Birth: June 08, 1956  Transition of Care [facility]) CM/SW [place], [place] Phone Number: [phone] 03/19/2021, 11:30 AM  Clinical Narrative:     CSW received a return call form pt's sister [name] and CSW provided the 3 bed offers. Charna Elizabeth and she chose [facility]. CSW attempted to call [facility] however they usually don't have an admission staff on the weekends and was unable reach anyone. Follow up with facility will occur on Monday.  TOC team will continue to assist with discharge planning needs.   Expected Discharge Plan: [place] Barriers to Discharge: Continued Medical Work up  Expected Discharge Plan and Services Expected Discharge Plan: [place] In-house Referral: Clinical Social Work   Post Acute Care Choice: [place] Living arrangements for the past 2 months: [place] Determinants of Health (SDOH) Interventions    Readmission Risk Interventions Readmission Risk Prevention Plan 06/04/2019  Transportation Screening Complete  PCP or Specialist Appt within 5-7 Days Complete  Home Care Screening Complete  Medication Review (RN CM) Complete  Some recent data might be hidden

## 2021-03-19 NOTE — Plan of Care (Signed)
  Problem: Education: Goal: Knowledge of General Education information will improve Description: Including pain rating scale, medication(s)/side effects and non-pharmacologic comfort measures 03/19/2021 1129 by Thressa Sheller, RN Outcome: Progressing 03/19/2021 1129 by Thressa Sheller, RN Outcome: Progressing   Problem: Health Behavior/Discharge Planning: Goal: Ability to manage health-related needs will improve 03/19/2021 1129 by Thressa Sheller, RN Outcome: Progressing 03/19/2021 1129 by Thressa Sheller, RN Outcome: Progressing   Problem: Clinical Measurements: Goal: Ability to maintain clinical measurements within normal limits will improve 03/19/2021 1129 by Thressa Sheller, RN Outcome: Progressing 03/19/2021 1129 by Thressa Sheller, RN Outcome: Progressing Goal: Diagnostic test results will improve Outcome: Progressing   Problem: Nutrition: Goal: Adequate nutrition will be maintained Outcome: Progressing   Problem: Coping: Goal: Level of anxiety will decrease Outcome: Progressing   Problem: Skin Integrity: Goal: Risk for impaired skin integrity will decrease Outcome: Progressing

## 2021-03-19 NOTE — TOC Progression Note (Signed)
Transition of Care [facility]) - Progression Note    Patient Details  Name: Seth [name] MRN: [id number] Date of Birth: 06/13/1956  Transition of Care [facility]) CM/SW [place], [place] Phone Number: [phone] 03/19/2021, 9:47 AM  Clinical Narrative:     CSW attempted to follow up with pt's sister [name] about pt's bed offer however had to leave VM. Pt is oriented only to person therefore CSW will continue to reach pt's sister.  TOC team will continue to assist with discharge planning needs.   Expected Discharge Plan: [place] Barriers to Discharge: Continued Medical Work up  Expected Discharge Plan and Services Expected Discharge Plan: [place] In-house Referral: Clinical Social Work   Post Acute Care Choice: [place] Living arrangements for the past 2 months: [place] Determinants of Health (SDOH) Interventions    Readmission Risk Interventions Readmission Risk Prevention Plan 06/04/2019  Transportation Screening Complete  PCP or Specialist Appt within 5-7 Days Complete  Home Care Screening Complete  Medication Review (RN CM) Complete  Some recent data might be hidden

## 2021-03-20 LAB — CREATININE, SERUM
Creatinine, Ser: 1.16 mg/dL (ref 0.61–1.24)
GFR, Estimated: >60 mL/min (ref >60)

## 2021-03-20 LAB — GLUCOSE, CAPILLARY
Glucose-Capillary: 75 mg/dL (ref 70–99)
Glucose-Capillary: 95 mg/dL (ref 70–99)
Glucose-Capillary: 88 mg/dL (ref 70–99)
Glucose-Capillary: 96 mg/dL (ref 70–99)
Glucose-Capillary: 119 mg/dL — ABNORMAL HIGH (ref 70–99)

## 2021-03-20 MED ORDER — FOLIC ACID 1 MG PO TABS: 1.0000 mg | ORAL_TABLET | Freq: Every day | ORAL | Status: DC

## 2021-03-20 MED ORDER — LEVETIRACETAM 750 MG PO TABS: 1500.0000 mg | ORAL_TABLET | Freq: Two times a day (BID) | ORAL | Status: DC

## 2021-03-20 MED ORDER — THIAMINE HCL 100 MG PO TABS: 100.0000 mg | ORAL_TABLET | Freq: Every day | ORAL | Status: DC

## 2021-03-20 MED ORDER — POLYETHYLENE GLYCOL 3350 17 G PO PACK: 17.0000 g | PACK | Freq: Every day | ORAL | 0 refills | Status: DC | PRN

## 2021-03-20 MED ORDER — METOPROLOL TARTRATE 50 MG PO TABS: 50.0000 mg | ORAL_TABLET | Freq: Two times a day (BID) | ORAL | Status: DC

## 2021-03-20 NOTE — TOC Progression Note (Signed)
Transition of Care [facility]) - Progression Note    Patient Details  Name: Seth [name] MRN: [id number] Date of Birth: 26-May-1956  Transition of Care [facility]) CM/SW [place], [place] Phone Number: [phone] 03/20/2021, 3:25 PM  Clinical Narrative:     CSW checked Navi portal to ascertain if Josem Kaufmann was back. Josem Kaufmann is still pending. Reference # [id number].  TOC team will continue to assist with discharge planning needs.   Expected Discharge Plan: [place] Barriers to Discharge: Continued Medical Work up  Expected Discharge Plan and Services Expected Discharge Plan: [place] In-house Referral: Clinical Social Work   Post Acute Care Choice: [place] Living arrangements for the past 2 months: [place] Determinants of Health (SDOH) Interventions    Readmission Risk Interventions Readmission Risk Prevention Plan 06/04/2019  Transportation Screening Complete  PCP or Specialist Appt within 5-7 Days Complete  Home Care Screening Complete  Medication Review (RN CM) Complete  Some recent data might be hidden

## 2021-03-20 NOTE — Discharge Summary (Addendum)
Physician Discharge Summary  Seth [name] [id number] DOB: 03/09/1956 DOA: 03/13/2021  PCP: Lucianne Lei, MD  Admit date: 03/13/2021 Discharge date: 03/22/2021  Admitted From: Home Disposition: Skilled nursing facility  Recommendations for Outpatient Follow-up:  Follow up with PCP in 1-2 weeks Please obtain BMP/CBC in one week   Home Health: Equipment/Devices:  Discharge Condition: Stable CODE STATUS: Full code Diet recommendation: Heart healthy  Brief/Interim Summary: 65 year old male with history of prior subdural hematoma, admitted with recurrent seizures requiring endotracheal intubation for airway protection.  He was also noted to have nonsustained V. tach.  He was admitted to the ICU, Keppra dose was increased and he was followed by neurology.  Seizures have since resolved.  He was also seen by cardiology and started on amiodarone.  Overall heart rhythm has stabilized.  Transferred to [facility] care on 7/20.  Seen by physical therapy with recommendations for SNF  Discharge Diagnoses:  Active Problems:   AKI (acute kidney injury) ([place])   Nonsustained ventricular tachycardia (HCC)   Encephalopathy acute   Seizure (HCC)   Pressure injury of skin  Recurrent seizures in the setting of recent subdural hematoma -Patient was admitted in May 2022 for subdural hematoma that was managed nonsurgically.  He was noted to have seizures at that time and was discharged on keppra -Admitted with recurrent seizures -Seen by neurology and Keppra dose was increased to '1500mg'$  bid -On LTM, further seizures had resolved   Nonsustained ventricular tachycardia -Was started on amiodarone per cardiology, now discontinued -no further ectopy or NSVT noted -30 day event monitor arranged by cardiology -Keep potassium greater than 4, magnesium greater than 2   Acute kidney injury -Creatinine 1.8 on arrival, likely related to volume depletion -Improved with IV hydration to 1.0   Respiratory failure  secondary to recurrent seizures -Was briefly intubated for airway management -He was on the ventilator from 7/17-7/19 -Extubated on 7/19 and currently on room air, breathing comfortably   Disposition -PT/OT -Seen by PT with recs for SNF placement, patient agreeable   HTN -restarted on metoprolol -family said he was not taking meds consistently at home -Blood pressure has been stable   HLD -continue on statin   COPD -no shortness of breath or wheezing -continue on Breo-Ellipta and Incruse   History of CVA History of PAD s/p DES to left popliteal artery 08/2020 -continue asa and statin -was supposed to be taking plavix as well, but this was discontinued in light of recent SDH -patient was not taking this at home anyways  History of alcohol abuse -Monitored in the hospital and no signs of withdrawal  Pressure injury Pressure Injury 03/14/21 Sacrum Medial;Lower Stage 1 -  Intact skin with non-blanchable redness of a localized area usually over a bony prominence. (Active)  03/14/21 0147  Location: Sacrum  Location Orientation: Medial;Lower  Staging: Stage 1 -  Intact skin with non-blanchable redness of a localized area usually over a bony prominence.  Wound Description (Comments):   Present on Admission: Yes  Treated supportively      Discharge Instructions   Allergies as of 03/20/2021   No Known Allergies      Medication List     TAKE these medications    albuterol 108 (90 Base) MCG/ACT inhaler Commonly known as: VENTOLIN HFA Inhale 2 puffs into the lungs every 6 (six) hours as needed for wheezing or shortness of breath.   aspirin 81 MG chewable tablet Chew 1 tablet (81 mg total) by mouth daily.   cyanocobalamin 1000  MCG tablet Take 1 tablet (1,000 mcg total) by mouth daily.   feeding supplement Liqd Take 237 mLs by mouth 3 (three) times daily between meals.   ferrous sulfate 325 (65 FE) MG tablet Take 1 tablet (325 mg total) by mouth daily with  breakfast.   fluticasone furoate-vilanterol 200-25 MCG/INH Aepb Commonly known as: BREO ELLIPTA Inhale 1 puff into the lungs daily.   folic acid 1 MG tablet Commonly known as: FOLVITE Take 1 tablet (1 mg total) by mouth daily. Start taking on: March 21, 2021   Incruse Ellipta 62.5 MCG/INH Aepb Generic drug: umeclidinium bromide INHALE 1 PUFF INTO THE LUNGS DAILY   levETIRAcetam 750 MG tablet Commonly known as: KEPPRA Take 2 tablets (1,500 mg total) by mouth 2 (two) times daily. What changed:  medication strength how much to take   metoprolol tartrate 50 MG tablet Commonly known as: LOPRESSOR Take 1 tablet (50 mg total) by mouth 2 (two) times daily. What changed:  medication strength how much to take   multivitamin with minerals Tabs tablet Take 1 tablet by mouth daily.   pantoprazole 40 MG tablet Commonly known as: PROTONIX Take 1 tablet (40 mg total) by mouth daily.   polyethylene glycol 17 g packet Commonly known as: MIRALAX / GLYCOLAX Take 17 g by mouth daily as needed for moderate constipation.   rosuvastatin 10 MG tablet Commonly known as: CRESTOR Take 1 tablet (10 mg total) by mouth daily.   thiamine 100 MG tablet Take 1 tablet (100 mg total) by mouth daily. Start taking on: March 21, 2021        No Known Allergies  Consultations: Neurology Cardiology PCCM   Procedures/Studies: CT Head Wo Contrast  Result Date: 03/13/2021 CLINICAL DATA:  Found down, seizure, unresponsive EXAM: CT HEAD WITHOUT CONTRAST TECHNIQUE: Contiguous axial images were obtained from the base of the skull through the vertex without intravenous contrast. COMPARISON:  01/19/2021 FINDINGS: Brain: Interval resolution of the left-sided subdural hematoma seen previously, with mild residual dural thickening along the left frontal convexity. No acute infarct or hemorrhage. Chronic small vessel ischemic changes throughout the periventricular white matter are stable. Lateral ventricles and  midline structures are unremarkable. No acute extra-axial fluid collections. No mass effect. Vascular: No hyperdense vessel or unexpected calcification. Skull: Normal. Negative for fracture or focal lesion. Sinuses/Orbits: Mild mucosal. Polypoid mucosal thickening of the ethmoid and frontal sinuses thickening right maxillary sinus unchanged. Other: None. IMPRESSION: 1. No acute intracranial process. 2. Interval resolution of the left-sided subdural hematoma seen previously, with mild residual dural thickening along the left frontal convexity. Electronically Signed   By: Randa Ngo M.D.   On: 03/13/2021 22:12   DG Chest Port 1 View  Result Date: 03/14/2021 CLINICAL DATA:  Respiratory failure. EXAM: PORTABLE CHEST 1 VIEW COMPARISON:  03/13/2021 FINDINGS: An endotracheal tube terminates below the clavicles, well above the carina. Enteric tubes course into the abdomen. Defibrillator pads overlie the chest. The cardiomediastinal silhouette is within normal limits. There is new mild right basilar airspace opacity as well as a small right pleural effusion. No pulmonary edema or pneumothorax is identified. IMPRESSION: New small right pleural effusion with right basilar atelectasis or infiltrate. Electronically Signed   By: Logan Bores M.D.   On: 03/14/2021 08:41   DG Chest Port 1 View  Result Date: 03/13/2021 CLINICAL DATA:  Intubated EXAM: PORTABLE CHEST 1 VIEW COMPARISON:  01/13/2021 FINDINGS: 2 frontal views of the chest demonstrate endotracheal tube overlying tracheal air column tip at level  of thoracic inlet. Enteric catheter passes below diaphragm tip overlying the gastric fundus. External defibrillator pads overlie the left chest. The cardiac silhouette is stable. No acute airspace disease, effusion, or pneumothorax. No acute bony abnormalities. IMPRESSION: 1. No complication after intubation. 2. No acute intrathoracic process. Electronically Signed   By: Randa Ngo M.D.   On: 03/13/2021 21:50    Overnight EEG with video  Result Date: 03/14/2021 Lora Havens, MD     03/15/2021 10:12 AM Patient Name: ELON SWETLAND MRN: [id number] Epilepsy Attending: Lora Havens Referring Physician/Provider: Dr Kerney Elbe Duration: 03/14/2021 0326 to 03/15/2021 0326 Patient history: 65 year old male presenting with 3 witnessed seizures after being found down by daughter. EEG to evaluate for seizure Level of alertness:  comatose AEDs during EEG study: LEV, Propofol Technical aspects: This EEG study was done with scalp electrodes positioned according to the 10-20 International system of electrode placement. Electrical activity was acquired at a sampling rate of '500Hz'$  and reviewed with a high frequency filter of '70Hz'$  and a low frequency filter of '1Hz'$ . EEG data were recorded continuously and digitally stored. Description: No clear posterior dominant rhythm was seen. EEG showed continuous generalized 3 to 5 Hz theta- delta slowing.  Lateralized periodic discharges with overriding fast activity were noted in left hemisphere at 0.5 Hz which at times appeared rhythmic with evolution in frequency and morphology lasting about 5 to 8 seconds consistent with brief ictal-interictal rhythmic discharges (BIRDs). Intermittent seizures without clinical signs were also noted to be arising from left frontotemporal region, average 1-2 seizures/hour, lasting on average about 10 to 15 seconds. Last seizure was noted on 03/14/2021 at [facility] - Seizure without clinical signs, left frontotemporal region - Lateralized periodic discharges with overriding fast activity ( LPD +), left hemisphere - Brief ictal-interictal rhythmic discharges, left hemisphere - Continuous slow, generalized IMPRESSION: This study showed seizures without clinical signs arising from left frontotemporal region, average 1-2 seizures per hour, lasting for about 10 to 15 seconds each.  Additionally lateralized periodic discharges with overriding fast  activity and brief ictal-interictal rhythmic discharges were noted which are on the ictal-interictal continuum with high potential for seizure recurrence.  There is also moderate to severe diffuse encephalopathy, nonspecific to etiology but could be secondary to seizures. Lora Havens   ECHOCARDIOGRAM LIMITED  Result Date: 03/14/2021    ECHOCARDIOGRAM LIMITED REPORT   Patient Name:   TRANELL GUTZMER Date of Exam: 03/14/2021 Medical Rec #:  [id number]       Height:       75.0 in Accession #:    [id number]      Weight:       140.0 lb Date of Birth:  08-28-1956        BSA:          1.886 m Patient Age:    65 years        BP:           124/65 mmHg Patient Gender: M               HR:           97 bpm. Exam Location:  Inpatient Procedure: Limited Echo, Cardiac Doppler and Color Doppler Indications:    Ventricular tachycardia  History:        Patient has prior history of Echocardiogram examinations, most                 recent 01/07/2021. COPD and Stroke, Arrythmias:Tachycardia;  Signs/Symptoms:Chest Pain.  Sonographer:    Luisa Hart RDCS Referring Phys: [id number] Shellia Cleverly  Sonographer Comments: No parasternal window, no apical window and echo performed with patient supine and on artificial respirator. IMPRESSIONS  1. Left ventricular ejection fraction, by estimation, is 70 to 75%. The left ventricle has hyperdynamic function.  2. Right ventricular systolic function is normal. The right ventricular size is normal. There is normal pulmonary artery systolic pressure. The estimated right ventricular systolic pressure is [id number] mmHg.  3. The mitral valve is grossly normal. No evidence of mitral valve regurgitation.  4. The aortic valve is grossly normal. Aortic valve regurgitation is not visualized. No aortic stenosis is present. Aortic valve mean gradient measures 8.0 mmHg. Aortic valve Vmax measures 1.94 m/s. FINDINGS  Left Ventricle: Left ventricular ejection fraction, by estimation, is 70 to 75%.  The left ventricle has hyperdynamic function. There is no left ventricular hypertrophy. Right Ventricle: The right ventricular size is normal. Right ventricular systolic function is normal. There is normal pulmonary artery systolic pressure. The tricuspid regurgitant velocity is 2.05 m/s, and with an assumed right atrial pressure of 3 mmHg,  the estimated right ventricular systolic pressure is [id number] mmHg. Left Atrium: Left atrial size was normal in size. Right Atrium: Right atrial size was normal in size. Pericardium: There is no evidence of pericardial effusion. Mitral Valve: The mitral valve is grossly normal. Tricuspid Valve: The tricuspid valve is normal in structure. Tricuspid valve regurgitation is mild. Aortic Valve: The aortic valve is grossly normal. Aortic valve regurgitation is not visualized. No aortic stenosis is present. Aortic valve mean gradient measures 8.0 mmHg. Aortic valve peak gradient measures 15.1 mmHg. Pulmonic Valve: The pulmonic valve was not assessed. AORTIC VALVE AV Vmax:      194.00 cm/s AV Vmean:     132.000 cm/s AV VTI:       0.289 m AV Peak Grad: 15.1 mmHg AV Mean Grad: 8.0 mmHg MITRAL VALVE               TRICUSPID VALVE MV Area (PHT): 5.62 cm    TR Peak grad:   16.8 mmHg MV Decel Time: 135 msec    TR Vmax:        205.00 cm/s MV E velocity: 61.30 cm/s MV A velocity: 63.90 cm/s MV E/A ratio:  0.96 Candee Furbish MD Electronically signed by Candee Furbish MD Signature Date/Time: 03/14/2021/10:27:22 AM    Final       Subjective: He had a headache last night, but this resolved with Tylenol.  No other complaints  Discharge Exam: Vitals:   03/20/21 0500 03/20/21 0831 03/20/21 0847 03/20/21 1639  BP:   115/75 133/76  Pulse:  92 97 79  Resp:  18  13  Temp:    98.5 F (36.9 C)  TempSrc:    Oral  SpO2:  96%    Weight: 65.6 kg     Height:        General: Pt is alert, awake, not in acute distress Cardiovascular: RRR, S1/S2 +, no rubs, no gallops Respiratory: CTA bilaterally, no  wheezing, no rhonchi Abdominal: Soft, NT, ND, bowel sounds + Extremities: Left foot TMA    The results of significant diagnostics from this hospitalization (including imaging, microbiology, ancillary and laboratory) are listed below for reference.     Microbiology: Recent Results (from the past 240 hour(s))  Resp Panel by RT-PCR (Flu A&B, Covid) Nasopharyngeal Swab     Status: None   Collection Time: 03/13/21 10:32  PM   Specimen: Nasopharyngeal Swab; Nasopharyngeal(NP) swabs in vial transport medium  Result Value Ref Range Status   SARS Coronavirus 2 by RT PCR NEGATIVE NEGATIVE Final    Comment: (NOTE) SARS-CoV-2 target nucleic acids are NOT DETECTED.  The SARS-CoV-2 RNA is generally detectable in upper respiratory specimens during the acute phase of infection. The lowest concentration of SARS-CoV-2 viral copies this assay can detect is 138 copies/mL. A negative result does not preclude SARS-Cov-2 infection and should not be used as the sole basis for treatment or other patient management decisions. A negative result may occur with  improper specimen collection/handling, submission of specimen other than nasopharyngeal swab, presence of viral mutation(s) within the areas targeted by this assay, and inadequate number of viral copies(<138 copies/mL). A negative result must be combined with clinical observations, patient history, and epidemiological information. The expected result is Negative.  Fact Sheet for Patients:  EntrepreneurPulse.com.au  Fact Sheet for Healthcare Providers:  IncredibleEmployment.be  This test is no t yet approved or cleared by the [place] FDA and  has been authorized for detection and/or diagnosis of SARS-CoV-2 by FDA under an Emergency Use Authorization (EUA). This EUA will remain  in effect (meaning this test can be used) for the duration of the COVID-19 declaration under Section 564(b)(1) of the Act,  21 U.S.C.section 360bbb-3(b)(1), unless the authorization is terminated  or revoked sooner.       Influenza A by PCR NEGATIVE NEGATIVE Final   Influenza B by PCR NEGATIVE NEGATIVE Final    Comment: (NOTE) The Xpert Xpress SARS-CoV-2/FLU/RSV plus assay is intended as an aid in the diagnosis of influenza from Nasopharyngeal swab specimens and should not be used as a sole basis for treatment. Nasal washings and aspirates are unacceptable for Xpert Xpress SARS-CoV-2/FLU/RSV testing.  Fact Sheet for Patients: EntrepreneurPulse.com.au  Fact Sheet for Healthcare Providers: IncredibleEmployment.be  This test is not yet approved or cleared by the [place] FDA and has been authorized for detection and/or diagnosis of SARS-CoV-2 by FDA under an Emergency Use Authorization (EUA). This EUA will remain in effect (meaning this test can be used) for the duration of the COVID-19 declaration under Section 564(b)(1) of the Act, 21 U.S.C. section 360bbb-3(b)(1), unless the authorization is terminated or revoked.  Performed at [facility], [place] [address] [place], [place] [address]   MRSA Next Gen by PCR, Nasal     Status: None   Collection Time: 03/14/21  1:28 AM   Specimen: Nasal Mucosa; Nasal Swab  Result Value Ref Range Status   MRSA by PCR Next Gen NOT DETECTED NOT DETECTED Final    Comment: (NOTE) The GeneXpert MRSA Assay (FDA approved for NASAL specimens only), is one component of a comprehensive MRSA colonization surveillance program. It is not intended to diagnose MRSA infection nor to guide or monitor treatment for MRSA infections. Test performance is not FDA approved in patients less than 68 years old. Performed at [facility], [place] [address] [place], [place] [address]      Labs: BNP (last 3 results) Recent Labs    01/12/21 0043 01/13/21 0143 01/14/21 0914  BNP 204.4* 193.7* [id number]   Basic Metabolic Panel: Recent  Labs  Lab 03/13/21 2229 03/13/21 2233 03/14/21 0002 03/14/21 0316 03/14/21 0617 03/15/21 0527 03/16/21 0113 03/17/21 0511 03/20/21 0041  NA 137 136   < > 132* 134* 136 137 134*  --   K 3.8 4.5   < > 3.5 3.5 3.4* 3.6 3.9  --  CL 95* 106  --   --  95* 101 104 99  --   CO2 9*  --   --   --  21* '26 23 22  '$ --   GLUCOSE 179* 172*  --   --  283* 119* 74 109*  --   BUN 16 15  --   --  '12 15 13 9  '$ --   CREATININE 1.81* 1.40*   < >  --  1.63* 1.59* 1.36* 1.04 1.16  CALCIUM 10.2  --   --   --  8.6* 8.6* 9.0 9.1  --   MG 3.9*  --   --   --  2.7* 2.3 1.8 1.9  --   PHOS 11.4*  --   --   --  6.6* 3.5 2.3* 4.1  --    < > = values in this interval not displayed.   Liver Function Tests: Recent Labs  Lab 03/13/21 2229 03/17/21 0511  AST 54*  --   ALT 17  --   ALKPHOS 95  --   BILITOT 1.6*  --   PROT 8.0  --   ALBUMIN 4.3 2.9*   No results for input(s): LIPASE, AMYLASE in the last 168 hours. No results for input(s): AMMONIA in the last 168 hours. CBC: Recent Labs  Lab 03/13/21 2229 03/13/21 2233 03/14/21 0127 03/14/21 0316 03/14/21 0617 03/15/21 0527 03/16/21 0113  WBC 15.4*  --  8.5  --  6.9 7.6 8.2  HGB 11.6*   < > 11.1* 11.9* 10.3* 8.7* 9.1*  HCT 40.0   < > 33.5* 35.0* 32.3* 26.3* 28.7*  MCV 100.8*  --  87.9  --  89.0 88.3 92.0  PLT 228  --  168  --  170 130* 124*   < > = values in this interval not displayed.   Cardiac Enzymes: No results for input(s): CKTOTAL, CKMB, CKMBINDEX, TROPONINI in the last 168 hours. BNP: Invalid input(s): POCBNP CBG: Recent Labs  Lab 03/19/21 2349 03/20/21 0328 03/20/21 1041 03/20/21 1232 03/20/21 1514  GLUCAP 98 75 95 88 96   D-Dimer No results for input(s): DDIMER in the last 72 hours. Hgb A1c No results for input(s): HGBA1C in the last 72 hours. Lipid Profile No results for input(s): CHOL, HDL, LDLCALC, TRIG, CHOLHDL, LDLDIRECT in the last 72 hours. Thyroid function studies No results for input(s): TSH, T4TOTAL, T3FREE,  THYROIDAB in the last 72 hours.  Invalid input(s): FREET3 Anemia work up No results for input(s): VITAMINB12, FOLATE, FERRITIN, TIBC, IRON, RETICCTPCT in the last 72 hours. Urinalysis    Component Value Date/Time   COLORURINE YELLOW 01/13/2021 [place] 01/13/2021 0949   LABSPEC 1.006 01/13/2021 0949   PHURINE 7.0 01/13/2021 0949   GLUCOSEU NEGATIVE 01/13/2021 0949   HGBUR NEGATIVE 01/13/2021 0949   BILIRUBINUR NEGATIVE 01/13/2021 [place] 01/13/2021 0949   PROTEINUR NEGATIVE 01/13/2021 0949   NITRITE NEGATIVE 01/13/2021 0949   LEUKOCYTESUR NEGATIVE 01/13/2021 0949   Sepsis Labs Invalid input(s): PROCALCITONIN,  WBC,  LACTICIDVEN Microbiology Recent Results (from the past 240 hour(s))  Resp Panel by RT-PCR (Flu A&B, Covid) Nasopharyngeal Swab     Status: None   Collection Time: 03/13/21 10:32 PM   Specimen: Nasopharyngeal Swab; Nasopharyngeal(NP) swabs in vial transport medium  Result Value Ref Range Status   SARS Coronavirus 2 by RT PCR NEGATIVE NEGATIVE Final    Comment: (NOTE) SARS-CoV-2 target nucleic acids are NOT DETECTED.  The SARS-CoV-2 RNA is  generally detectable in upper respiratory specimens during the acute phase of infection. The lowest concentration of SARS-CoV-2 viral copies this assay can detect is 138 copies/mL. A negative result does not preclude SARS-Cov-2 infection and should not be used as the sole basis for treatment or other patient management decisions. A negative result may occur with  improper specimen collection/handling, submission of specimen other than nasopharyngeal swab, presence of viral mutation(s) within the areas targeted by this assay, and inadequate number of viral copies(<138 copies/mL). A negative result must be combined with clinical observations, patient history, and epidemiological information. The expected result is Negative.  Fact Sheet for Patients:   EntrepreneurPulse.com.au  Fact Sheet for Healthcare Providers:  IncredibleEmployment.be  This test is no t yet approved or cleared by the [place] FDA and  has been authorized for detection and/or diagnosis of SARS-CoV-2 by FDA under an Emergency Use Authorization (EUA). This EUA will remain  in effect (meaning this test can be used) for the duration of the COVID-19 declaration under Section 564(b)(1) of the Act, 21 U.S.C.section 360bbb-3(b)(1), unless the authorization is terminated  or revoked sooner.       Influenza A by PCR NEGATIVE NEGATIVE Final   Influenza B by PCR NEGATIVE NEGATIVE Final    Comment: (NOTE) The Xpert Xpress SARS-CoV-2/FLU/RSV plus assay is intended as an aid in the diagnosis of influenza from Nasopharyngeal swab specimens and should not be used as a sole basis for treatment. Nasal washings and aspirates are unacceptable for Xpert Xpress SARS-CoV-2/FLU/RSV testing.  Fact Sheet for Patients: EntrepreneurPulse.com.au  Fact Sheet for Healthcare Providers: IncredibleEmployment.be  This test is not yet approved or cleared by the [place] FDA and has been authorized for detection and/or diagnosis of SARS-CoV-2 by FDA under an Emergency Use Authorization (EUA). This EUA will remain in effect (meaning this test can be used) for the duration of the COVID-19 declaration under Section 564(b)(1) of the Act, 21 U.S.C. section 360bbb-3(b)(1), unless the authorization is terminated or revoked.  Performed at [facility], [place] [address] [place], [place] [address]   MRSA Next Gen by PCR, Nasal     Status: None   Collection Time: 03/14/21  1:28 AM   Specimen: Nasal Mucosa; Nasal Swab  Result Value Ref Range Status   MRSA by PCR Next Gen NOT DETECTED NOT DETECTED Final    Comment: (NOTE) The GeneXpert MRSA Assay (FDA approved for NASAL specimens only), is one component of a  comprehensive MRSA colonization surveillance program. It is not intended to diagnose MRSA infection nor to guide or monitor treatment for MRSA infections. Test performance is not FDA approved in patients less than 85 years old. Performed at [facility], [place] [address], [place],  [address]      Time coordinating discharge: 65mns  SIGNED:   JKathie Dike MD  [facility] 03/20/2021, 5:58 PM   If 7PM-7AM, please contact night-coverage [email or web address]

## 2021-03-21 ENCOUNTER — Other Ambulatory Visit: Payer: Self-pay

## 2021-03-21 LAB — RESP PANEL BY RT-PCR (FLU A&B, COVID) ARPGX2
Influenza A by PCR: NEGATIVE
Influenza B by PCR: NEGATIVE
SARS Coronavirus 2 by RT PCR: NEGATIVE

## 2021-03-21 LAB — GLUCOSE, CAPILLARY
Glucose-Capillary: 100 mg/dL — ABNORMAL HIGH (ref 70–99)
Glucose-Capillary: 106 mg/dL — ABNORMAL HIGH (ref 70–99)
Glucose-Capillary: 106 mg/dL — ABNORMAL HIGH (ref 70–99)
Glucose-Capillary: 80 mg/dL (ref 70–99)
Glucose-Capillary: 93 mg/dL (ref 70–99)
Glucose-Capillary: 98 mg/dL (ref 70–99)

## 2021-03-21 NOTE — Progress Notes (Signed)
Physical Therapy Treatment Patient Details Name: [name] MRN: [id number] DOB: 1955-09-23 Today's Date: 03/21/2021    History of Present Illness pt is a 65 y/o male admitted 7/17 after 3 witnessed seizures at home; found to be unresponsive by EMS.  It is unclear how long the patiet was down.  On arrival pt was urgently intubated in the ED.  7/19 extubated, on LTM, seizures have resolved.  PMHx:  stroke/SDH, HTN, gout, admission in may for SDH    PT Comments    Pt with improving mobility and able to amb with assistance in hallway. Continue to recommend ST-SNF prior to return home.    Follow Up Recommendations  SNF;Supervision/Assistance - 24 hour     Equipment Recommendations  None recommended by PT    Recommendations for Other Services       Precautions / Restrictions Precautions Precautions: Fall    Mobility  Bed Mobility Overal bed mobility: Needs Assistance Bed Mobility: Supine to Sit;Sit to Supine     Supine to sit: Min guard Sit to supine: Min guard   General bed mobility comments: Assist for safety    Transfers Overall transfer level: Needs assistance Equipment used: 4-wheeled Schmoker Transfers: Sit to/from Stand Sit to Stand: Min assist         General transfer comment: Assist to bring hips up and for balance  Ambulation/Gait Ambulation/Gait assistance: Min assist Gait Distance (Feet): 125 Feet Assistive device: 4-wheeled Rotan Gait Pattern/deviations: Step-through pattern;Decreased stride length;Trunk flexed Gait velocity: decr Gait velocity interpretation: 1.31 - 2.62 ft/sec, indicative of limited community ambulator General Gait Details: Assist for balance. Verbal cues to stay closer to [place]   Stairs             Wheelchair Mobility    Modified Rankin (Stroke Patients Only)       Balance Overall balance assessment: Needs assistance Sitting-balance support: Feet supported;No upper extremity supported Sitting balance-[name]  Scale: Fair     Standing balance support: Bilateral upper extremity supported;Single extremity supported;During functional activity Standing balance-[name] Scale: Poor Standing balance comment: [place] and min assist for static standing                            Cognition Arousal/Alertness: Awake/alert Behavior During Therapy: Flat affect Overall Cognitive Status: Impaired/Different from baseline Area of Impairment: Orientation;Attention;Safety/judgement;Awareness;Problem solving                 Orientation Level: Situation Current Attention Level: Selective     Safety/Judgement: Decreased awareness of safety;Decreased awareness of deficits Awareness: Intellectual Problem Solving: Slow processing;Requires verbal cues        Exercises      General Comments General comments (skin integrity, edema, etc.): VSS on RA      Pertinent Vitals/Pain Pain Assessment: No/denies pain    Home Living                      Prior Function            PT Goals (current goals can now be found in the care plan section) Acute Rehab PT Goals PT Goal Formulation: Patient unable to participate in goal setting Time For Goal Achievement: 03/31/21 Potential to Achieve Goals: Fair Progress towards PT goals: Goals met and updated - see care plan    Frequency    Min 2X/week      PT Plan Current plan remains appropriate;Frequency needs to be updated  Co-evaluation              AM-PAC PT "6 Clicks" Mobility   Outcome Measure  Help needed turning from your back to your side while in a flat bed without using bedrails?: None Help needed moving from lying on your back to sitting on the side of a flat bed without using bedrails?: A Little Help needed moving to and from a bed to a chair (including a wheelchair)?: A Little Help needed standing up from a chair using your arms (e.g., wheelchair or bedside chair)?: A Little Help needed to walk in hospital room?:  A Little Help needed climbing 3-5 steps with a railing? : A Little 6 Click Score: 19    End of Session Equipment Utilized During Treatment: Gait belt Activity Tolerance: Patient tolerated treatment well Patient left: with call bell/phone within reach;in bed;with bed alarm set Nurse Communication: Mobility status PT Visit Diagnosis: Unsteadiness on feet (R26.81);Other abnormalities of gait and mobility (R26.89);Muscle weakness (generalized) (M62.81);Difficulty in walking, not elsewhere classified (R26.2);Other symptoms and signs involving the nervous system (R29.898)     Time: 8270-7867 PT Time Calculation (min) (ACUTE ONLY): 14 min  Charges:  $Gait Training: 8-22 mins                     [place] Pager [phone] Office [place] 03/21/2021, 6:34 PM

## 2021-03-21 NOTE — Progress Notes (Addendum)
Per CSW  [name], Pt bed at [name] will not be ready until the morning (03/22/21). Pt notified of change.

## 2021-03-21 NOTE — Progress Notes (Signed)
Informed by staff that insurance authorization has been approved for patient to go to SNF and plan is for discharge today.  Patient seen and examined. He does not have any complaints today. He is in good spirits and looking forward to discharge today. Vitals have been stable and exam is unchanged  Please refer to DC summary done yesterday for further details on hospital course and DC meds. No change in plans or meds since yesterday. Patient is otherwise stable for discharge today.  [employer]

## 2021-03-21 NOTE — Care Management Important Message (Signed)
Important Message  Patient Details  Name: [name] MRN: [id number] Date of Birth: July 31, 1956   Medicare Important Message Given:  Yes     [name] 03/21/2021, 3:56 PM

## 2021-03-21 NOTE — TOC Progression Note (Addendum)
Transition of Care [facility]) - Progression Note    Patient Details  Name: [name] MRN: [id number] Date of Birth: Aug 02, 1956  [facility] CM/SW Contact  Reece Agar, [place] Phone Number: 03/21/2021, 10:12 AM  Clinical Narrative:    CSW followed up with Candace Cruise is still pending. CSW will follow up. CSW contacted Helene Kelp at [facility], she was in a meeting and stated she will follow up with CSW on bed offer.   Auth approved for pt ref# [id number], Pt DC pending bed availably at [place] for today.   Expected Discharge Plan: [place] Barriers to Discharge: Continued Medical Work up  Expected Discharge Plan and Services Expected Discharge Plan: [place] In-house Referral: Clinical Social Work   Post Acute Care Choice: [place] Living arrangements for the past 2 months: [place] Determinants of Health (SDOH) Interventions    Readmission Risk Interventions Readmission Risk Prevention Plan 06/04/2019  Transportation Screening Complete  PCP or Specialist Appt within 5-7 Days Complete  Home Care Screening Complete  Medication Review (RN CM) Complete  Some recent data might be hidden

## 2021-03-21 NOTE — Care Management Important Message (Signed)
Important Message  Patient Details  Name: [name] MRN: [id number] Date of Birth: [birth year]   Medicare Important Message Given:  Yes     [name] 03/21/2021, 3:54 PM

## 2021-03-22 DIAGNOSIS — L89159 Pressure ulcer of sacral region, unspecified stage: Secondary | ICD-10-CM | POA: Diagnosis not present

## 2021-03-22 DIAGNOSIS — R296 Repeated falls: Secondary | ICD-10-CM | POA: Diagnosis not present

## 2021-03-22 DIAGNOSIS — R5381 Other malaise: Secondary | ICD-10-CM | POA: Diagnosis not present

## 2021-03-22 DIAGNOSIS — Z7401 Bed confinement status: Secondary | ICD-10-CM | POA: Diagnosis not present

## 2021-03-22 DIAGNOSIS — R569 Unspecified convulsions: Secondary | ICD-10-CM | POA: Diagnosis not present

## 2021-03-22 DIAGNOSIS — R2689 Other abnormalities of gait and mobility: Secondary | ICD-10-CM | POA: Diagnosis not present

## 2021-03-22 DIAGNOSIS — M869 Osteomyelitis, unspecified: Secondary | ICD-10-CM | POA: Diagnosis not present

## 2021-03-22 DIAGNOSIS — S06300A Unspecified focal traumatic brain injury without loss of consciousness, initial encounter: Secondary | ICD-10-CM | POA: Diagnosis not present

## 2021-03-22 DIAGNOSIS — R531 Weakness: Secondary | ICD-10-CM | POA: Diagnosis not present

## 2021-03-22 DIAGNOSIS — E440 Moderate protein-calorie malnutrition: Secondary | ICD-10-CM | POA: Diagnosis not present

## 2021-03-22 DIAGNOSIS — E46 Unspecified protein-calorie malnutrition: Secondary | ICD-10-CM | POA: Diagnosis not present

## 2021-03-22 DIAGNOSIS — Z743 Need for continuous supervision: Secondary | ICD-10-CM | POA: Diagnosis not present

## 2021-03-22 DIAGNOSIS — S065X0S Traumatic subdural hemorrhage without loss of consciousness, sequela: Secondary | ICD-10-CM | POA: Diagnosis not present

## 2021-03-22 DIAGNOSIS — I10 Essential (primary) hypertension: Secondary | ICD-10-CM | POA: Diagnosis not present

## 2021-03-22 DIAGNOSIS — I472 Ventricular tachycardia: Secondary | ICD-10-CM | POA: Diagnosis not present

## 2021-03-22 DIAGNOSIS — K219 Gastro-esophageal reflux disease without esophagitis: Secondary | ICD-10-CM | POA: Diagnosis not present

## 2021-03-22 DIAGNOSIS — J449 Chronic obstructive pulmonary disease, unspecified: Secondary | ICD-10-CM | POA: Diagnosis not present

## 2021-03-22 DIAGNOSIS — G9340 Encephalopathy, unspecified: Secondary | ICD-10-CM | POA: Diagnosis not present

## 2021-03-22 DIAGNOSIS — I639 Cerebral infarction, unspecified: Secondary | ICD-10-CM | POA: Diagnosis not present

## 2021-03-22 DIAGNOSIS — D649 Anemia, unspecified: Secondary | ICD-10-CM | POA: Diagnosis not present

## 2021-03-22 DIAGNOSIS — Z7289 Other problems related to lifestyle: Secondary | ICD-10-CM | POA: Diagnosis not present

## 2021-03-22 DIAGNOSIS — R278 Other lack of coordination: Secondary | ICD-10-CM | POA: Diagnosis not present

## 2021-03-22 DIAGNOSIS — N179 Acute kidney failure, unspecified: Secondary | ICD-10-CM | POA: Diagnosis not present

## 2021-03-22 DIAGNOSIS — R000 Tachycardia, unspecified: Secondary | ICD-10-CM | POA: Diagnosis not present

## 2021-03-22 DIAGNOSIS — M6281 Muscle weakness (generalized): Secondary | ICD-10-CM | POA: Diagnosis not present

## 2021-03-22 DIAGNOSIS — U071 COVID-19: Secondary | ICD-10-CM | POA: Diagnosis not present

## 2021-03-22 DIAGNOSIS — Z8673 Personal history of transient ischemic attack (TIA), and cerebral infarction without residual deficits: Secondary | ICD-10-CM | POA: Diagnosis not present

## 2021-03-22 LAB — GLUCOSE, CAPILLARY
Glucose-Capillary: 75 mg/dL (ref 70–99)
Glucose-Capillary: 81 mg/dL (ref 70–99)
Glucose-Capillary: 88 mg/dL (ref 70–99)
Glucose-Capillary: 89 mg/dL (ref 70–99)
Glucose-Capillary: 90 mg/dL (ref 70–99)
Glucose-Capillary: 98 mg/dL (ref 70–99)

## 2021-03-22 NOTE — Plan of Care (Signed)
  Problem: Education: Goal: Knowledge of General Education information will improve Description: Including pain rating scale, medication(s)/side effects and non-pharmacologic comfort measures Outcome: Progressing   Problem: Health Behavior/Discharge Planning: Goal: Ability to manage health-related needs will improve Outcome: Progressing   Problem: Clinical Measurements: Goal: Ability to maintain clinical measurements within normal limits will improve Outcome: Progressing Goal: Will remain free from infection Outcome: Progressing Goal: Diagnostic test results will improve Outcome: Progressing   Problem: Nutrition: Goal: Adequate nutrition will be maintained Outcome: Progressing   Problem: Coping: Goal: Level of anxiety will decrease Outcome: Progressing   Problem: Safety: Goal: Ability to remain free from injury will improve Outcome: Progressing   Problem: Skin Integrity: Goal: Risk for impaired skin integrity will decrease Outcome: Progressing

## 2021-03-22 NOTE — Progress Notes (Signed)
Patient has been stable for discharge for the last several days.  He has been waiting on insurance authorization and availability of a bed at skilled nursing facility.  Per staff, he does have a bed available today at the nursing facility and has insurance authorization.  Patient seen and examined.  Does not have any complaints at this time.  Was able to ambulate earlier today.  Vitals are otherwise stable.  No change in condition.  Plan continues to be unchanged as outlined in the discharge summary from 7/24.  No change in discharge medications.  Please refer to discharge summary for further details.  He is otherwise stable for discharge.  Ambulance transfer has been requested.  [employer]

## 2021-03-22 NOTE — TOC Transition Note (Addendum)
Transition of Care [facility]) - CM/SW Discharge Note   Patient Details  Name: CULLEY PLANO MRN: [id number] Date of Birth: 01/20/1956  Transition of Care [facility]) CM/SW Contact:  Tresa Endo Phone Number: 03/22/2021, 1:06 PM   Clinical Narrative:    Patient will DC to: [facility] Anticipated DC date: 03/22/2021 Family notified: Pt sister Transport by: Corey Harold   Per MD patient ready for DC to [facility] room 109. RN to call report prior to discharge ([phone]). RN, patient, patient's family, and facility notified of DC. Discharge Summary and FL2 sent to facility. DC packet on chart. Ambulance transport requested for patient.   CSW will sign off for now as social work intervention is no longer needed. Please consult [place] again if new needs arise.     Final next level of care: Skilled Nursing Facility Barriers to Discharge: Continued Medical Work up   Patient Goals and CMS Choice   CMS Medicare.gov Compare Post Acute Care list provided to:: Patient Choice offered to / list presented to : Patient  Discharge Placement                       Discharge Plan and Services In-house Referral: Clinical Social Work   Post Acute Care Choice: [place]                               Social Determinants of Health (SDOH) Interventions     Readmission Risk Interventions Readmission Risk Prevention Plan 06/04/2019  Transportation Screening Complete  PCP or Specialist Appt within 5-7 Days Complete  Home Care Screening Complete  Medication Review (RN CM) Complete  Some recent data might be hidden

## 2021-03-22 NOTE — Progress Notes (Signed)
Occupational Therapy Treatment Patient Details Name: JANMICHAEL ELLETT MRN: [id number] DOB: 1956-04-28 Today's Date: 03/22/2021    History of present illness pt is a 65 y/o male admitted 7/17 after 3 witnessed seizures at home; found to be unresponsive by EMS.  It is unclear how long the patiet was down.  On arrival pt was urgently intubated in the ED.  7/19 extubated, on LTM, seizures have resolved.  PMHx:  stroke/SDH, HTN, gout, admission in may for SDH   OT comments  Pt progressing well, updated goals today.  Requires min guard for bed mobility and min assist for LB ADLs at EOB but limited session due to lightheadedness (with pt request to lay back down).  BP at EOB 94/73 and supine 128/77.  RN notified. Will benefit from SNF rehab, remains disoriented with poor awareness and problem solving.    Follow Up Recommendations  SNF    Equipment Recommendations  Other (comment) (defer to next venue)    Recommendations for Other Services      Precautions / Restrictions Precautions Precautions: Fall       Mobility Bed Mobility Overal bed mobility: Needs Assistance Bed Mobility: Supine to Sit;Sit to Supine     Supine to sit: Min guard Sit to supine: Min guard   General bed mobility comments: Assist for safety    Transfers                 General transfer comment: pt declined due to lightheaeded today    Balance Overall balance assessment: Needs assistance Sitting-balance support: Feet supported;No upper extremity supported Sitting balance-[name] Scale: Fair Sitting balance - Comments: min guard to supervision                                   ADL either performed or assessed with clinical judgement   ADL Overall ADL's : Needs assistance/impaired     Grooming: Minimal assistance Grooming Details (indicate cue type and reason): EOB     Lower Body Bathing: Minimal assistance;Sitting/lateral leans               Toileting- Clothing Manipulation  and Hygiene: Total assistance;Bed level Toileting - Clothing Manipulation Details (indicate cue type and reason): to change pads from condom cath off and soiled pads     Functional mobility during ADLs: Min guard;Cueing for safety (EOB only) General ADL Comments: pt limited by feeling lightheaded, once sitting requesting to lay back down after about 5 minutes.  BP assessed and RN notified of orthostatic drop     Vision       Perception     Praxis      Cognition Arousal/Alertness: Awake/alert Behavior During Therapy: Flat affect Overall Cognitive Status: Impaired/Different from baseline Area of Impairment: Orientation;Attention;Safety/judgement;Awareness;Problem solving                 Orientation Level: Disoriented to;Time;Situation Current Attention Level: Sustained     Safety/Judgement: Decreased awareness of deficits;Decreased awareness of safety Awareness: Intellectual Problem Solving: Slow processing;Requires verbal cues General Comments: pt disoriented to time and situation, reports september.  Patient able to follow commands with increased time but poor awareness to safety, deficits and unaware condom cath had come off and bed was saturated        Exercises     Shoulder Instructions       General Comments BP seated 94/73, supine 128/77 RN notified and pt symptomatic (lightheaded)  Pertinent Vitals/ Pain       Pain Assessment: No/denies pain  Home Living                                          Prior Functioning/Environment              Frequency  Min 2X/week        Progress Toward Goals  OT Goals(current goals can now be found in the care plan section)     Acute Rehab OT Goals Patient Stated Goal: to lay back down OT Goal Formulation: With patient  Plan Discharge plan remains appropriate;Frequency remains appropriate    Co-evaluation                 AM-PAC OT "6 Clicks" Daily Activity     Outcome  Measure   Help from another person eating meals?: A Little Help from another person taking care of personal grooming?: A Little Help from another person toileting, which includes using toliet, bedpan, or urinal?: Total Help from another person bathing (including washing, rinsing, drying)?: A Little Help from another person to put on and taking off regular upper body clothing?: A Lot Help from another person to put on and taking off regular lower body clothing?: A Lot 6 Click Score: 14    End of Session    OT Visit Diagnosis: Unsteadiness on feet (R26.81);Muscle weakness (generalized) (M62.81);Other abnormalities of gait and mobility (R26.89);Other symptoms and signs involving cognitive function   Activity Tolerance Patient limited by fatigue (lightheaded)   Patient Left in bed;with call bell/phone within reach;with bed alarm set   Nurse Communication Mobility status;Other (comment) (BP EOB vs supine)        Time: [id number] OT Time Calculation (min): 12 min  Charges: OT General Charges $OT Visit: 1 Visit OT Treatments $Self Care/Home Management : 8-22 mins  Jolaine Artist, OT [place] Pager [phone] Office [phone]    Delight Stare 03/22/2021, 12:00 PM

## 2021-03-22 NOTE — Plan of Care (Signed)
  Problem: Clinical Measurements: Goal: Ability to maintain clinical measurements within normal limits will improve Outcome: Adequate for Discharge Goal: Will remain free from infection Outcome: Adequate for Discharge Goal: Diagnostic test results will improve Outcome: Adequate for Discharge Goal: Respiratory complications will improve Outcome: Adequate for Discharge Goal: Cardiovascular complication will be avoided Outcome: Adequate for Discharge   Problem: Activity: Goal: Risk for activity intolerance will decrease Outcome: Adequate for Discharge   

## 2021-03-23 DIAGNOSIS — Z7289 Other problems related to lifestyle: Secondary | ICD-10-CM | POA: Diagnosis not present

## 2021-03-23 DIAGNOSIS — I10 Essential (primary) hypertension: Secondary | ICD-10-CM | POA: Diagnosis not present

## 2021-03-23 DIAGNOSIS — K219 Gastro-esophageal reflux disease without esophagitis: Secondary | ICD-10-CM | POA: Diagnosis not present

## 2021-03-23 DIAGNOSIS — J449 Chronic obstructive pulmonary disease, unspecified: Secondary | ICD-10-CM | POA: Diagnosis not present

## 2021-03-23 DIAGNOSIS — Z8673 Personal history of transient ischemic attack (TIA), and cerebral infarction without residual deficits: Secondary | ICD-10-CM | POA: Diagnosis not present

## 2021-03-24 ENCOUNTER — Encounter: Payer: Self-pay | Admitting: *Deleted

## 2021-03-24 NOTE — Progress Notes (Unsigned)
Patient ID: [name], male   DOB: [birth year], 65 y.o.   MRN: [id number] Patient enrolled for [employer] to ship a 30 day cardiac event monitor to: [facility] at [facility] in care of Jefre Pam [address] [place], [place]  [address] Attn: Gertie Exon  Letter with instructions mailed to same address.

## 2021-03-25 DIAGNOSIS — I10 Essential (primary) hypertension: Secondary | ICD-10-CM | POA: Diagnosis not present

## 2021-03-25 DIAGNOSIS — J449 Chronic obstructive pulmonary disease, unspecified: Secondary | ICD-10-CM | POA: Diagnosis not present

## 2021-03-25 DIAGNOSIS — Z8673 Personal history of transient ischemic attack (TIA), and cerebral infarction without residual deficits: Secondary | ICD-10-CM | POA: Diagnosis not present

## 2021-03-25 DIAGNOSIS — K219 Gastro-esophageal reflux disease without esophagitis: Secondary | ICD-10-CM | POA: Diagnosis not present

## 2021-03-25 DIAGNOSIS — D649 Anemia, unspecified: Secondary | ICD-10-CM | POA: Diagnosis not present

## 2021-03-29 DIAGNOSIS — R5381 Other malaise: Secondary | ICD-10-CM | POA: Diagnosis not present

## 2021-03-30 DIAGNOSIS — I10 Essential (primary) hypertension: Secondary | ICD-10-CM | POA: Diagnosis not present

## 2021-03-30 DIAGNOSIS — Z8673 Personal history of transient ischemic attack (TIA), and cerebral infarction without residual deficits: Secondary | ICD-10-CM | POA: Diagnosis not present

## 2021-03-30 DIAGNOSIS — K219 Gastro-esophageal reflux disease without esophagitis: Secondary | ICD-10-CM | POA: Diagnosis not present

## 2021-03-30 DIAGNOSIS — R5381 Other malaise: Secondary | ICD-10-CM | POA: Diagnosis not present

## 2021-03-30 DIAGNOSIS — J449 Chronic obstructive pulmonary disease, unspecified: Secondary | ICD-10-CM | POA: Diagnosis not present

## 2021-03-30 DIAGNOSIS — N179 Acute kidney failure, unspecified: Secondary | ICD-10-CM | POA: Diagnosis not present

## 2021-03-30 DIAGNOSIS — E46 Unspecified protein-calorie malnutrition: Secondary | ICD-10-CM | POA: Diagnosis not present

## 2021-04-01 ENCOUNTER — Ambulatory Visit (INDEPENDENT_AMBULATORY_CARE_PROVIDER_SITE_OTHER): Payer: Medicare Other

## 2021-04-01 DIAGNOSIS — R000 Tachycardia, unspecified: Secondary | ICD-10-CM | POA: Diagnosis not present

## 2021-04-01 DIAGNOSIS — I472 Ventricular tachycardia: Secondary | ICD-10-CM | POA: Diagnosis not present

## 2021-04-18 ENCOUNTER — Telehealth: Payer: Self-pay | Admitting: Internal Medicine

## 2021-04-18 ENCOUNTER — Telehealth: Payer: Self-pay | Admitting: Family

## 2021-04-18 NOTE — Telephone Encounter (Signed)
New message:     Critical EKG

## 2021-04-18 NOTE — Telephone Encounter (Signed)
[name] from [place] critical EKG

## 2021-04-18 NOTE — Telephone Encounter (Signed)
Strips from [employer] reviewed and shows loss of signal and artifact. Left message for pt sister to call to confirm patient is doing okay.
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
## 2021-04-18 NOTE — Telephone Encounter (Signed)
Duplicate-see other notes.

## 2021-04-26 NOTE — Telephone Encounter (Signed)
Attempted to contact patient to check back due to not hearing back from patient. No answer.   Called and spoke with patient's sister (emergency contact), per patients sister he was doing well when she spoke with him the day before yesterday. Patient's sister states he rarely answers the phone and that patient currently lives alone. Patients sister will attempt to contact patient as well to let him know we were trying to reach him to see how he is doing. Advised patient's sister to call back with any issues. Will attempt to contact patient again at another time.

## 2021-04-27 DIAGNOSIS — M869 Osteomyelitis, unspecified: Secondary | ICD-10-CM | POA: Diagnosis not present

## 2021-04-27 DIAGNOSIS — R531 Weakness: Secondary | ICD-10-CM | POA: Diagnosis not present

## 2021-04-27 DIAGNOSIS — U071 COVID-19: Secondary | ICD-10-CM | POA: Diagnosis not present
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
## 2021-05-03 ENCOUNTER — Ambulatory Visit (HOSPITAL_BASED_OUTPATIENT_CLINIC_OR_DEPARTMENT_OTHER): Payer: Medicare Other | Admitting: Family

## 2021-05-06 NOTE — Telephone Encounter (Signed)
Attempted to call patient, line continues to ring repeatedly. Patient also no showed to his recent appointment that was scheduled with Laurann Montana on 9/6. Letter has been mailed to patient, stating to call the office when able.

## 2021-05-16 ENCOUNTER — Inpatient Hospital Stay (HOSPITAL_COMMUNITY)
Admission: EM | Admit: 2021-05-16 | Discharge: 2021-05-24 | DRG: 100 | Disposition: A | Discharge status: Skilled Nursing Facility | Payer: Medicare Other | Attending: Internal Medicine | Admitting: Internal Medicine

## 2021-05-16 ENCOUNTER — Emergency Department (HOSPITAL_COMMUNITY): Payer: Medicare Other

## 2021-05-16 DIAGNOSIS — I63512 Cerebral infarction due to unspecified occlusion or stenosis of left middle cerebral artery: Secondary | ICD-10-CM | POA: Diagnosis present

## 2021-05-16 DIAGNOSIS — Z95820 Peripheral vascular angioplasty status with implants and grafts: Secondary | ICD-10-CM | POA: Diagnosis not present

## 2021-05-16 DIAGNOSIS — R911 Solitary pulmonary nodule: Secondary | ICD-10-CM | POA: Diagnosis present

## 2021-05-16 DIAGNOSIS — D320 Benign neoplasm of cerebral meninges: Secondary | ICD-10-CM | POA: Diagnosis not present

## 2021-05-16 DIAGNOSIS — S065X9S Traumatic subdural hemorrhage with loss of consciousness of unspecified duration, sequela: Secondary | ICD-10-CM

## 2021-05-16 DIAGNOSIS — I6330 Cerebral infarction due to thrombosis of unspecified cerebral artery: Secondary | ICD-10-CM | POA: Insufficient documentation

## 2021-05-16 DIAGNOSIS — Z20822 Contact with and (suspected) exposure to covid-19: Secondary | ICD-10-CM | POA: Diagnosis not present

## 2021-05-16 DIAGNOSIS — G40909 Epilepsy, unspecified, not intractable, without status epilepticus: Secondary | ICD-10-CM

## 2021-05-16 DIAGNOSIS — I672 Cerebral atherosclerosis: Secondary | ICD-10-CM | POA: Diagnosis not present

## 2021-05-16 DIAGNOSIS — F1290 Cannabis use, unspecified, uncomplicated: Secondary | ICD-10-CM | POA: Diagnosis not present

## 2021-05-16 DIAGNOSIS — Z89432 Acquired absence of left foot: Secondary | ICD-10-CM

## 2021-05-16 DIAGNOSIS — T675XXA Heat exhaustion, unspecified, initial encounter: Secondary | ICD-10-CM | POA: Diagnosis not present

## 2021-05-16 DIAGNOSIS — T426X6A Underdosing of other antiepileptic and sedative-hypnotic drugs, initial encounter: Secondary | ICD-10-CM | POA: Diagnosis present

## 2021-05-16 DIAGNOSIS — R000 Tachycardia, unspecified: Secondary | ICD-10-CM | POA: Diagnosis not present

## 2021-05-16 DIAGNOSIS — G40101 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, not intractable, with status epilepticus: Secondary | ICD-10-CM | POA: Diagnosis not present

## 2021-05-16 DIAGNOSIS — I6381 Other cerebral infarction due to occlusion or stenosis of small artery: Secondary | ICD-10-CM | POA: Diagnosis not present

## 2021-05-16 DIAGNOSIS — M4854XA Collapsed vertebra, not elsewhere classified, thoracic region, initial encounter for fracture: Secondary | ICD-10-CM | POA: Diagnosis not present

## 2021-05-16 DIAGNOSIS — R569 Unspecified convulsions: Secondary | ICD-10-CM | POA: Diagnosis not present

## 2021-05-16 DIAGNOSIS — I719 Aortic aneurysm of unspecified site, without rupture: Secondary | ICD-10-CM | POA: Diagnosis not present

## 2021-05-16 DIAGNOSIS — I6523 Occlusion and stenosis of bilateral carotid arteries: Secondary | ICD-10-CM | POA: Diagnosis not present

## 2021-05-16 DIAGNOSIS — I10 Essential (primary) hypertension: Secondary | ICD-10-CM | POA: Diagnosis present

## 2021-05-16 DIAGNOSIS — Z79899 Other long term (current) drug therapy: Secondary | ICD-10-CM

## 2021-05-16 DIAGNOSIS — R414 Neurologic neglect syndrome: Secondary | ICD-10-CM | POA: Diagnosis not present

## 2021-05-16 DIAGNOSIS — F17210 Nicotine dependence, cigarettes, uncomplicated: Secondary | ICD-10-CM | POA: Diagnosis not present

## 2021-05-16 DIAGNOSIS — J449 Chronic obstructive pulmonary disease, unspecified: Secondary | ICD-10-CM | POA: Diagnosis present

## 2021-05-16 DIAGNOSIS — I69351 Hemiplegia and hemiparesis following cerebral infarction affecting right dominant side: Secondary | ICD-10-CM

## 2021-05-16 DIAGNOSIS — R509 Fever, unspecified: Secondary | ICD-10-CM | POA: Diagnosis present

## 2021-05-16 DIAGNOSIS — I7771 Dissection of carotid artery: Secondary | ICD-10-CM | POA: Diagnosis not present

## 2021-05-16 DIAGNOSIS — Z743 Need for continuous supervision: Secondary | ICD-10-CM | POA: Diagnosis not present

## 2021-05-16 DIAGNOSIS — Z7982 Long term (current) use of aspirin: Secondary | ICD-10-CM | POA: Diagnosis not present

## 2021-05-16 DIAGNOSIS — M6282 Rhabdomyolysis: Secondary | ICD-10-CM | POA: Diagnosis present

## 2021-05-16 DIAGNOSIS — R29818 Other symptoms and signs involving the nervous system: Secondary | ICD-10-CM | POA: Diagnosis not present

## 2021-05-16 DIAGNOSIS — R29722 NIHSS score 22: Secondary | ICD-10-CM | POA: Diagnosis present

## 2021-05-16 DIAGNOSIS — R4020 Unspecified coma: Secondary | ICD-10-CM | POA: Diagnosis not present

## 2021-05-16 DIAGNOSIS — R4701 Aphasia: Secondary | ICD-10-CM | POA: Diagnosis present

## 2021-05-16 DIAGNOSIS — E785 Hyperlipidemia, unspecified: Secondary | ICD-10-CM | POA: Diagnosis present

## 2021-05-16 DIAGNOSIS — Z681 Body mass index (BMI) 19 or less, adult: Secondary | ICD-10-CM

## 2021-05-16 DIAGNOSIS — M109 Gout, unspecified: Secondary | ICD-10-CM | POA: Diagnosis present

## 2021-05-16 DIAGNOSIS — I69398 Other sequelae of cerebral infarction: Secondary | ICD-10-CM | POA: Diagnosis not present

## 2021-05-16 DIAGNOSIS — G8384 Todd's paralysis (postepileptic): Secondary | ICD-10-CM | POA: Diagnosis present

## 2021-05-16 DIAGNOSIS — Z7951 Long term (current) use of inhaled steroids: Secondary | ICD-10-CM

## 2021-05-16 DIAGNOSIS — R4182 Altered mental status, unspecified: Secondary | ICD-10-CM

## 2021-05-16 DIAGNOSIS — Z7289 Other problems related to lifestyle: Secondary | ICD-10-CM | POA: Diagnosis not present

## 2021-05-16 DIAGNOSIS — I70202 Unspecified atherosclerosis of native arteries of extremities, left leg: Secondary | ICD-10-CM | POA: Diagnosis not present

## 2021-05-16 DIAGNOSIS — G9349 Other encephalopathy: Secondary | ICD-10-CM | POA: Diagnosis present

## 2021-05-16 DIAGNOSIS — R471 Dysarthria and anarthria: Secondary | ICD-10-CM | POA: Diagnosis present

## 2021-05-16 DIAGNOSIS — E876 Hypokalemia: Secondary | ICD-10-CM | POA: Diagnosis present

## 2021-05-16 DIAGNOSIS — N400 Enlarged prostate without lower urinary tract symptoms: Secondary | ICD-10-CM | POA: Diagnosis present

## 2021-05-16 DIAGNOSIS — Z8249 Family history of ischemic heart disease and other diseases of the circulatory system: Secondary | ICD-10-CM

## 2021-05-16 DIAGNOSIS — R531 Weakness: Secondary | ICD-10-CM | POA: Diagnosis not present

## 2021-05-16 DIAGNOSIS — Z7902 Long term (current) use of antithrombotics/antiplatelets: Secondary | ICD-10-CM

## 2021-05-16 DIAGNOSIS — X58XXXS Exposure to other specified factors, sequela: Secondary | ICD-10-CM | POA: Diagnosis present

## 2021-05-16 DIAGNOSIS — F1020 Alcohol dependence, uncomplicated: Secondary | ICD-10-CM | POA: Diagnosis present

## 2021-05-16 DIAGNOSIS — Z91138 Patient's unintentional underdosing of medication regimen for other reason: Secondary | ICD-10-CM

## 2021-05-16 DIAGNOSIS — R29810 Facial weakness: Secondary | ICD-10-CM | POA: Diagnosis present

## 2021-05-16 DIAGNOSIS — Z981 Arthrodesis status: Secondary | ICD-10-CM

## 2021-05-16 DIAGNOSIS — I671 Cerebral aneurysm, nonruptured: Secondary | ICD-10-CM | POA: Diagnosis not present

## 2021-05-16 DIAGNOSIS — F1090 Alcohol use, unspecified, uncomplicated: Secondary | ICD-10-CM | POA: Diagnosis present

## 2021-05-16 LAB — COMPREHENSIVE METABOLIC PANEL
ALT: 18 U/L (ref 0–44)
AST: 67 U/L — ABNORMAL HIGH (ref 15–41)
Albumin: 4 g/dL (ref 3.5–5.0)
Alkaline Phosphatase: 105 U/L (ref 38–126)
Anion gap: 17 — ABNORMAL HIGH (ref 5–15)
BUN: 10 mg/dL (ref 8–23)
CO2: 17 mmol/L — ABNORMAL LOW (ref 22–32)
Calcium: 9.5 mg/dL (ref 8.9–10.3)
Chloride: 102 mmol/L (ref 98–111)
Creatinine, Ser: 1.4 mg/dL — ABNORMAL HIGH (ref 0.61–1.24)
GFR, Estimated: 56 mL/min — ABNORMAL LOW (ref >60)
Glucose, Bld: 92 mg/dL (ref 70–99)
Potassium: 4.9 mmol/L (ref 3.5–5.1)
Sodium: 136 mmol/L (ref 135–145)
Total Bilirubin: 1.1 mg/dL (ref 0.3–1.2)
Total Protein: 7.5 g/dL (ref 6.5–8.1)

## 2021-05-16 LAB — DIFFERENTIAL
Abs Immature Granulocytes: 0.09 10*3/uL — ABNORMAL HIGH (ref 0.00–0.07)
Basophils Absolute: 0 10*3/uL (ref 0.0–0.1)
Basophils Relative: 0 %
Eosinophils Absolute: 0 10*3/uL (ref 0.0–0.5)
Eosinophils Relative: 0 %
Immature Granulocytes: 1 %
Lymphocytes Relative: 8 %
Lymphs Abs: 1 10*3/uL (ref 0.7–4.0)
Monocytes Absolute: 1.3 10*3/uL — ABNORMAL HIGH (ref 0.1–1.0)
Monocytes Relative: 11 %
Neutro Abs: 9.8 10*3/uL — ABNORMAL HIGH (ref 1.7–7.7)
Neutrophils Relative %: 80 %

## 2021-05-16 LAB — I-STAT CHEM 8, ED
BUN: 10 mg/dL (ref 8–23)
Calcium, Ion: 1.04 mmol/L — ABNORMAL LOW (ref 1.15–1.40)
Chloride: 104 mmol/L (ref 98–111)
Creatinine, Ser: 1.3 mg/dL — ABNORMAL HIGH (ref 0.61–1.24)
Glucose, Bld: 96 mg/dL (ref 70–99)
HCT: 43 % (ref 39.0–52.0)
Hemoglobin: 14.6 g/dL (ref 13.0–17.0)
Potassium: 4.5 mmol/L (ref 3.5–5.1)
Sodium: 134 mmol/L — ABNORMAL LOW (ref 135–145)
TCO2: 24 mmol/L (ref 22–32)

## 2021-05-16 LAB — RESP PANEL BY RT-PCR (FLU A&B, COVID) ARPGX2
Influenza A by PCR: NEGATIVE
Influenza B by PCR: NEGATIVE
SARS Coronavirus 2 by RT PCR: NEGATIVE

## 2021-05-16 LAB — CBC
HCT: 41.2 % (ref 39.0–52.0)
Hemoglobin: 13.2 g/dL (ref 13.0–17.0)
MCH: 28.6 pg (ref 26.0–34.0)
MCHC: 32 g/dL (ref 30.0–36.0)
MCV: 89.2 fL (ref 80.0–100.0)
Platelets: 173 10*3/uL (ref 150–400)
RBC: 4.62 MIL/uL (ref 4.22–5.81)
RDW: 19.3 % — ABNORMAL HIGH (ref 11.5–15.5)
WBC: 12.2 10*3/uL — ABNORMAL HIGH (ref 4.0–10.5)
nRBC: 0.3 % — ABNORMAL HIGH (ref 0.0–0.2)

## 2021-05-16 LAB — CBG MONITORING, ED: Glucose-Capillary: 108 mg/dL — ABNORMAL HIGH (ref 70–99)

## 2021-05-16 LAB — APTT: aPTT: 30 seconds (ref 24–36)

## 2021-05-16 LAB — PROTIME-INR
INR: 1.1 (ref 0.8–1.2)
Prothrombin Time: 14.6 seconds (ref 11.4–15.2)

## 2021-05-16 LAB — ETHANOL: Alcohol, Ethyl (B): <10 mg/dL (ref <10)

## 2021-05-16 IMAGING — CT CT HEAD CODE STROKE
3 of 4 series · 15 of 47 positions shown, 18 images · non-contrast
Comparison: [DATE]

CLINICAL DATA: Code stroke.

EXAM:
CT HEAD WITHOUT CONTRAST
TECHNIQUE: Contiguous axial images were obtained from the base of the skull
through the vertex without intravenous contrast.

[Series 3: head 5.0 st · axial · 0.41mm/px · z∈[-116,+14]mm · 9 of 32 slices shown, 12 images]
[im 3/32  brain]
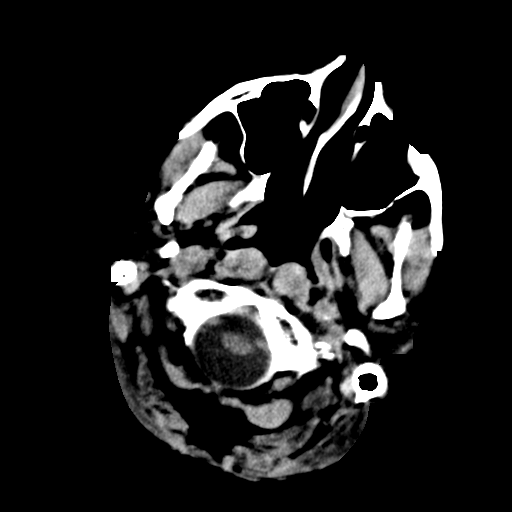
[im 3/32  bone]
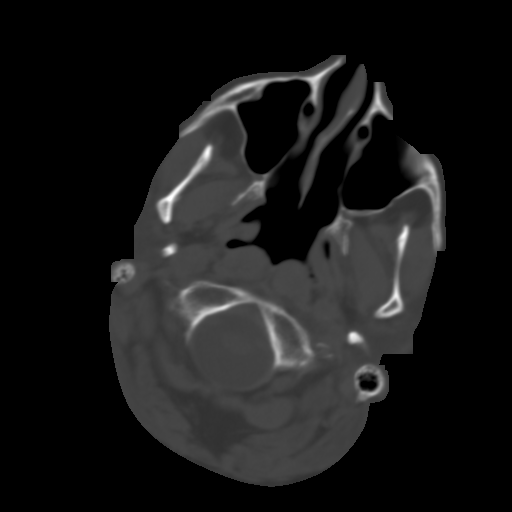
[im 7/32  brain]
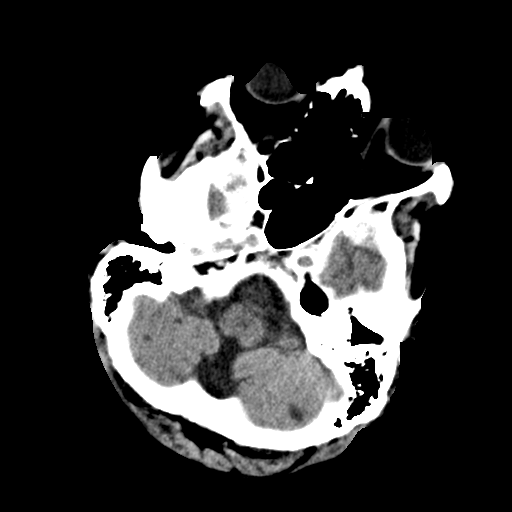
[im 9/32  brain]
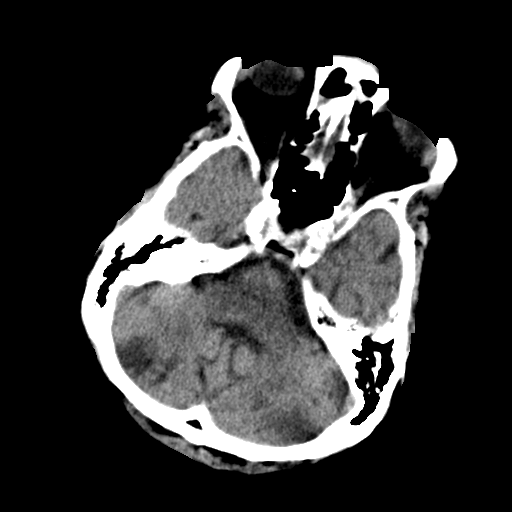
[im 14/32  brain]
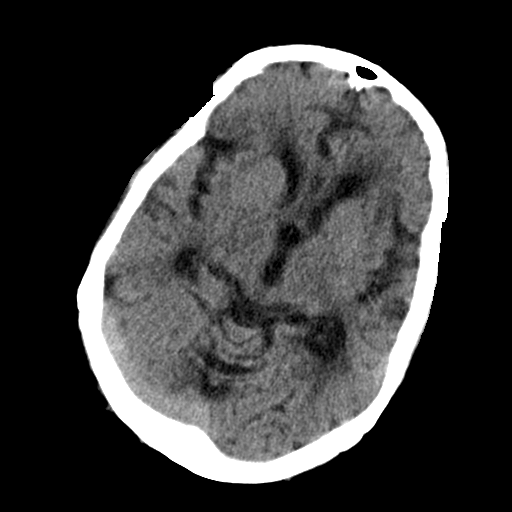
[im 16/32  brain]
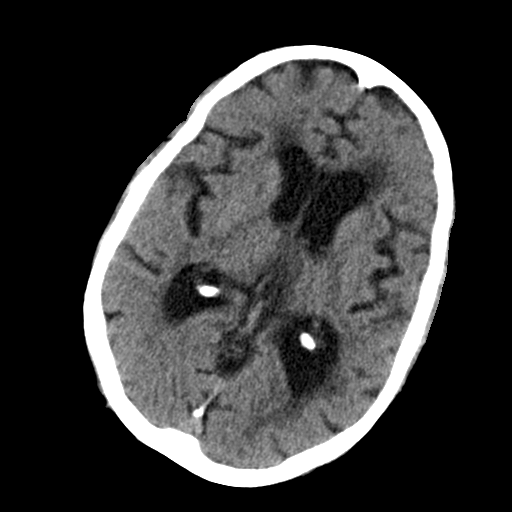
[im 16/32  bone]
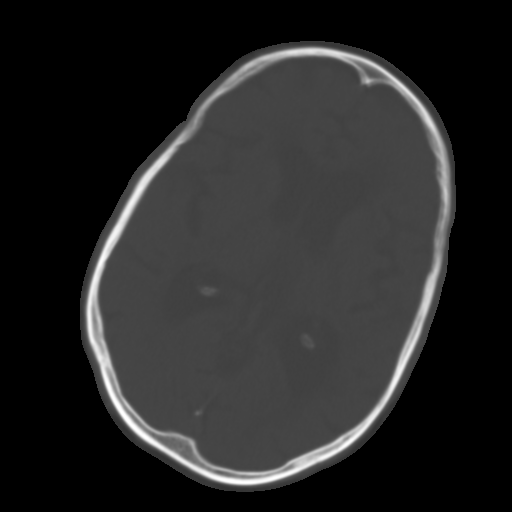
[im 18/32  brain]
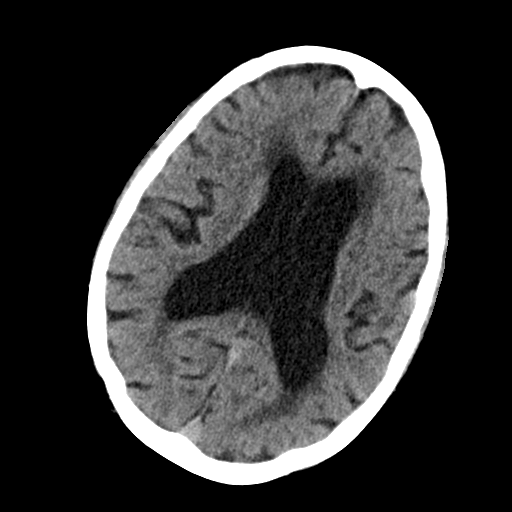
[im 23/32  brain]
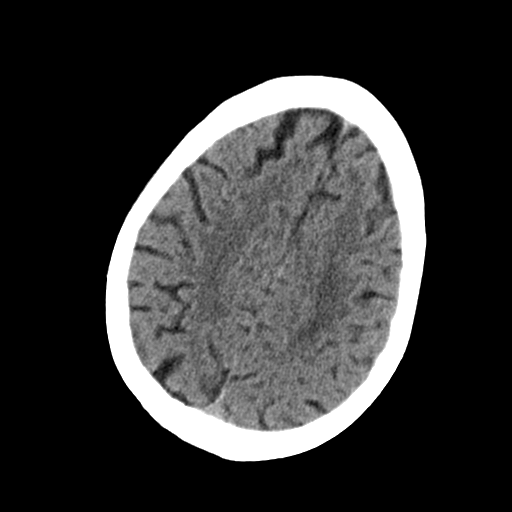
[im 25/32  brain]
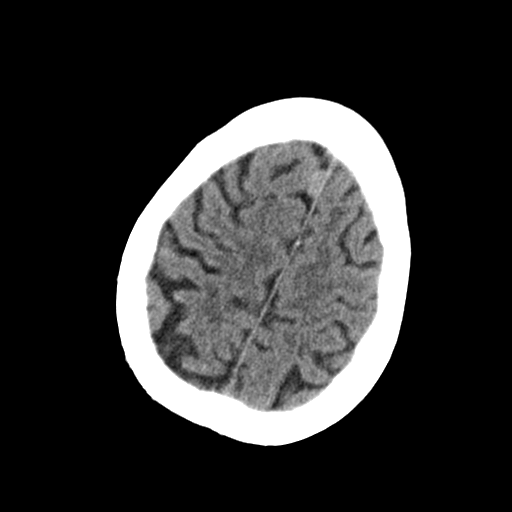
[im 29/32  brain]
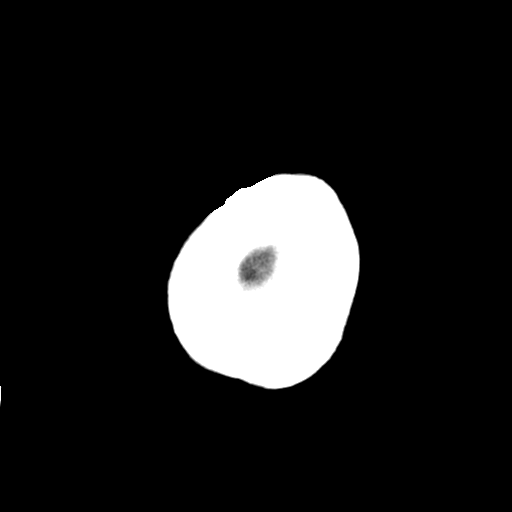
[im 29/32  bone]
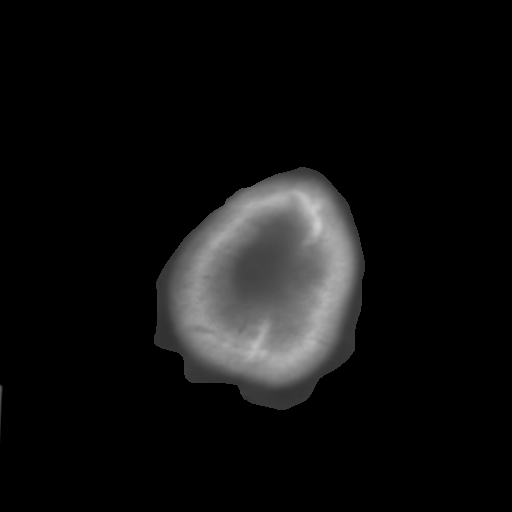

[Series 5: head 3.0 cor st · coronal · 0.31mm/px · 3 of 67 slices shown]
[im 23/67  brain]
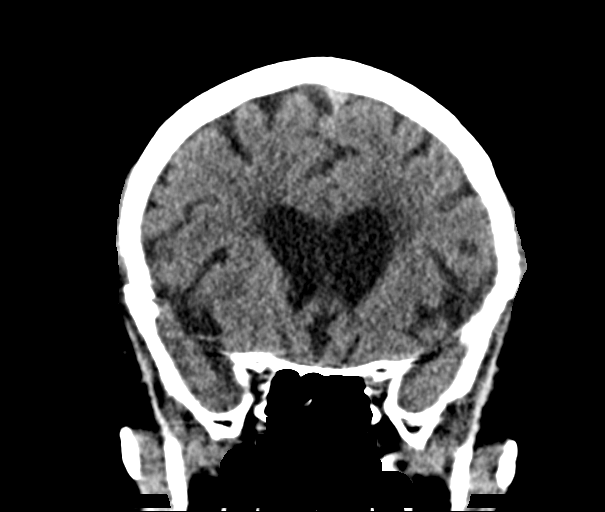
[im 30/67  brain]
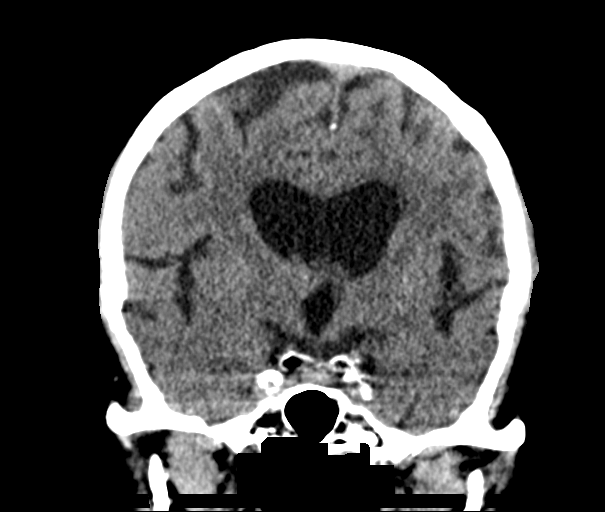
[im 37/67  brain]
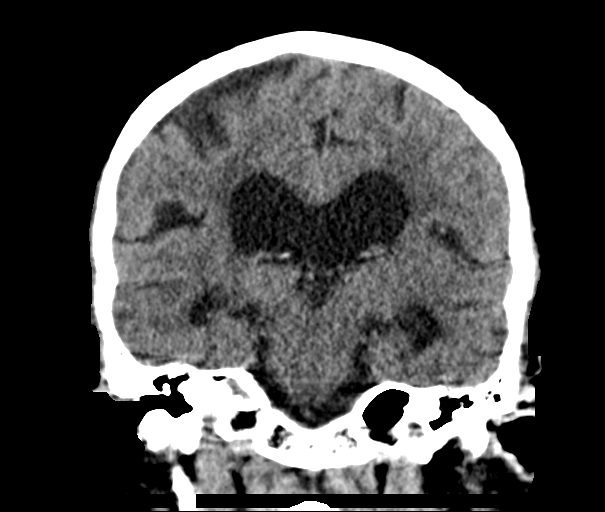

[Series 6: head 3.0 sag st · sagittal · 0.31mm/px · 3 of 63 slices shown]
[im 21/63  brain]
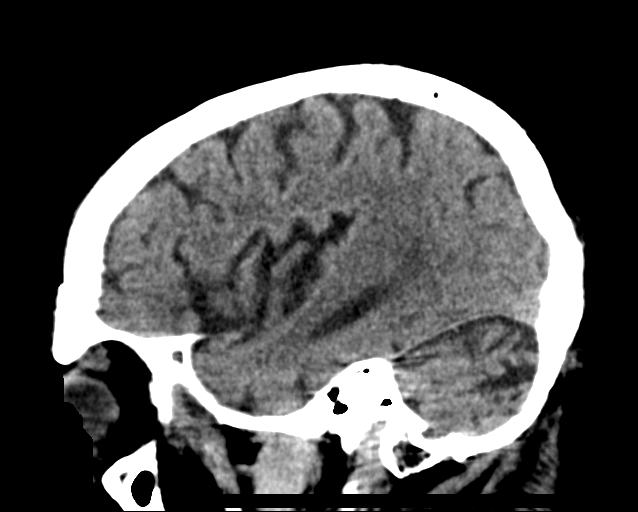
[im 32/63  brain]
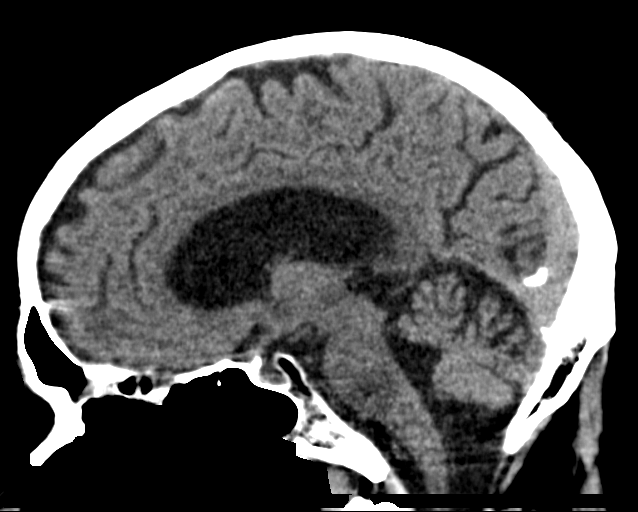
[im 42/63  brain]
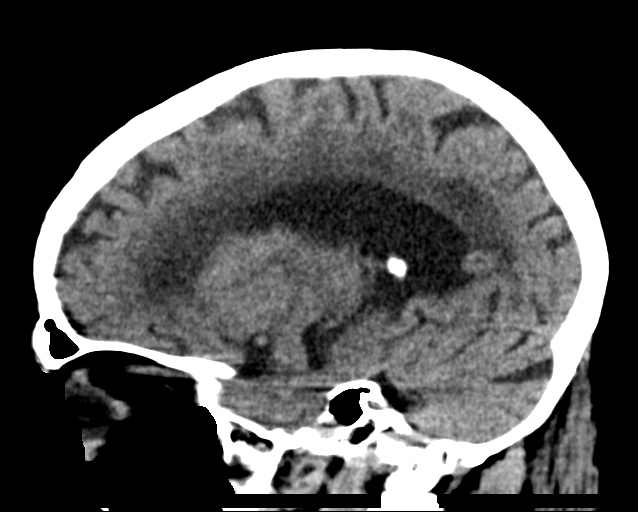

[15 of 47 positions shown; findings below may reference images not displayed]

FINDINGS: Brain: No evidence of acute infarction, hemorrhage, cerebral edema,
mass, mass effect, or midline shift. No extra-axial fluid
collection. Periventricular white matter changes, likely the sequela
of chronic small vessel ischemic disease. Lacunar infarcts in the
cerebellum, right-greater-than-left.

Vascular: No hyperdense vessel. Atherosclerotic calcifications in
the intracranial carotid and vertebral arteries.

Skull: Normal. Negative for fracture or focal lesion.

Sinuses/Orbits: No acute finding.

Other: The mastoid air cells are well aerated.

ASPECTS ([place] Stroke Program Early CT Score)

- Ganglionic level infarction (caudate, lentiform nuclei, internal
capsule, insula, M1-M3 cortex): 7

- Supraganglionic infarction (M4-M6 cortex): 3

Total score (0-10 with 10 being normal): 10
IMPRESSION: 1. No acute intracranial process.
2. ASPECTS is 10

Code stroke imaging results were communicated on [DATE] at [DATE] to provider Dr. DEMARDO via secure text paging.

## 2021-05-16 IMAGING — DX DG CHEST 1V PORT
1 series · 1 of 1 positions shown · non-contrast
Comparison: Chest radiograph dated [DATE].

CLINICAL DATA: Right-sided weakness.

EXAM:
PORTABLE CHEST 1 VIEW

[chest ap]
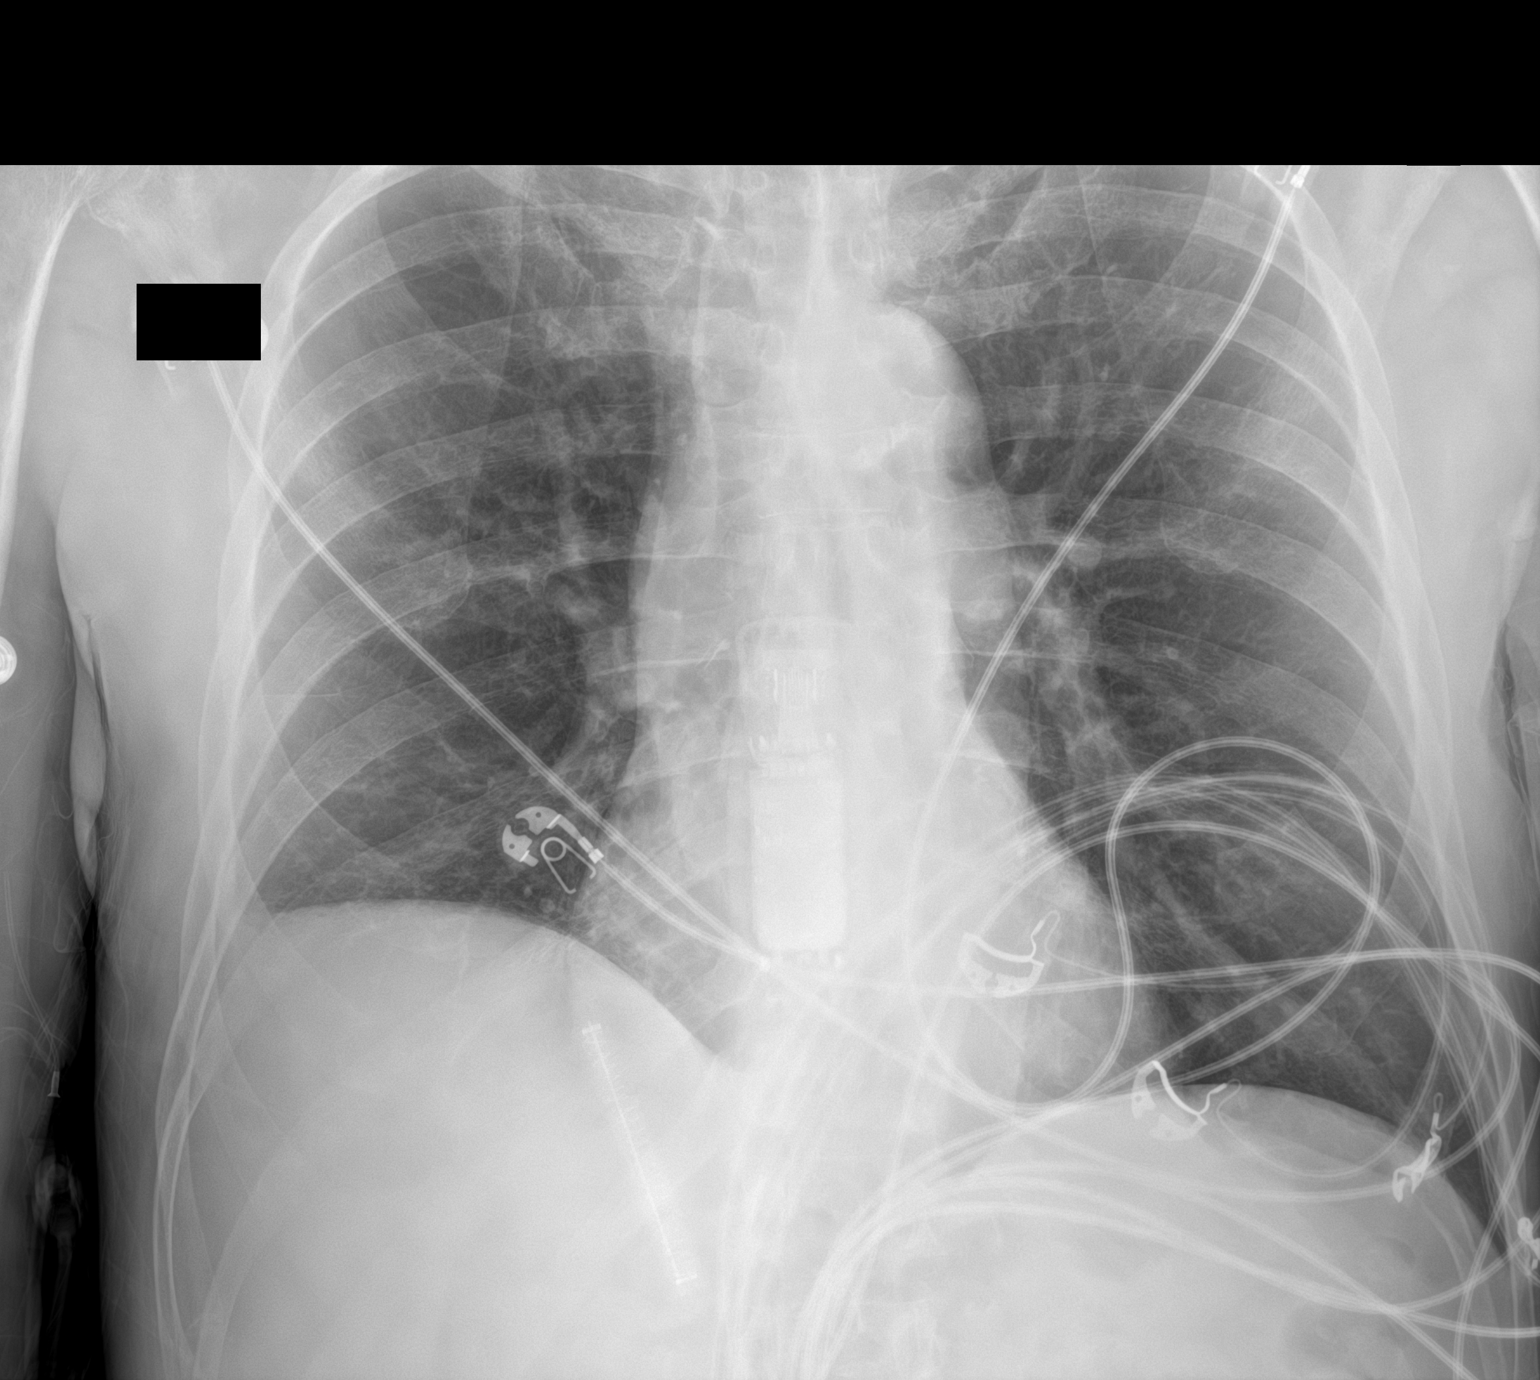

[1 of 1 positions shown; findings below may reference images not displayed]

FINDINGS: No focal consolidation, pleural effusion or pneumothorax. The
cardiac silhouette is within normal limits. Atherosclerotic
calcification of the aorta. No acute osseous pathology.
IMPRESSION: No active disease.

## 2021-05-16 MED ORDER — MIDAZOLAM HCL 2 MG/2ML IJ SOLN: 2.0000 mg | INTRAMUSCULAR | Status: DC | PRN

## 2021-05-16 MED ORDER — LEVETIRACETAM IN NACL 1000 MG/100ML IV SOLN
1000.0000 mg | Freq: Two times a day (BID) | INTRAVENOUS | Status: DC
  Administered 2021-05-17 (×2): 1000 mg via INTRAVENOUS
  Filled 2021-05-16 (×2): qty 100

## 2021-05-16 MED ORDER — SODIUM CHLORIDE 0.9 % IV BOLUS
1000.0000 mL | Freq: Once | INTRAVENOUS | Status: AC
  Administered 2021-05-16: 1000 mL via INTRAVENOUS

## 2021-05-16 MED ORDER — MIDAZOLAM HCL 2 MG/2ML IJ SOLN
INTRAMUSCULAR | Status: AC
  Filled 2021-05-16: qty 6

## 2021-05-16 MED ORDER — MIDAZOLAM HCL 2 MG/2ML IJ SOLN
4.0000 mg | Freq: Once | INTRAMUSCULAR | Status: AC
  Administered 2021-05-16: 4 mg via INTRAVENOUS

## 2021-05-16 MED ORDER — THIAMINE HCL 100 MG/ML IJ SOLN
500.0000 mg | Freq: Once | INTRAVENOUS | Status: AC
  Administered 2021-05-17: 500 mg via INTRAVENOUS
  Filled 2021-05-16: qty 5

## 2021-05-16 MED ORDER — MIDAZOLAM HCL 2 MG/2ML IJ SOLN
2.0000 mg | Freq: Four times a day (QID) | INTRAMUSCULAR | Status: DC | PRN
Start: 2021-05-16 — End: 2021-05-22
  Administered 2021-05-18: 2 mg via INTRAVENOUS
  Filled 2021-05-16 (×2): qty 2

## 2021-05-16 MED ORDER — MIDAZOLAM HCL 2 MG/2ML IJ SOLN
INTRAMUSCULAR | Status: AC
  Administered 2021-05-16: 4 mg via INTRAVENOUS
  Filled 2021-05-16: qty 4

## 2021-05-16 MED ORDER — LEVETIRACETAM IN NACL 1000 MG/100ML IV SOLN
1000.0000 mg | INTRAVENOUS | Status: AC
  Administered 2021-05-16 (×4): 1000 mg via INTRAVENOUS
  Filled 2021-05-16 (×4): qty 100

## 2021-05-16 MED ORDER — MIDAZOLAM HCL 2 MG/2ML IJ SOLN: 4.0000 mg | Freq: Once | INTRAMUSCULAR | Status: AC

## 2021-05-16 NOTE — ED Triage Notes (Signed)
Brought by [facility] from home for CVA system. Last known well time 0830. Found by family on bathroom floor lethargic and weakness. Hx of CVA. Rt side weakness with Lt gaze.
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
## 2021-05-16 NOTE — ED Notes (Signed)
Seizure pads placed on pt stretcher

## 2021-05-16 NOTE — ED Notes (Signed)
Returned from ct 

## 2021-05-16 NOTE — ED Provider Notes (Signed)
ATTENDING SUPERVISORY NOTE I have personally viewed the imaging studies performed. I have personally seen and examined the patient, and discussed the plan of care with the resident.  I have reviewed the documentation of the resident and agree.  No diagnosis found.  .Critical Care Performed by: Elnora Morrison, MD Authorized by: Elnora Morrison, MD   Critical care provider statement:    Critical care time (minutes):  40   Critical care start time:  05/16/2021 8:00 PM   Critical care end time:  05/16/2021 8:40 PM   Critical care time was exclusive of:  Separately billable procedures and treating other patients and teaching time   Critical care was necessary to treat or prevent imminent or life-threatening deterioration of the following conditions:  CNS failure or compromise   Critical care was time spent personally by me on the following activities:  Discussions with consultants, evaluation of patient's response to treatment, examination of patient, ordering and performing treatments and interventions, ordering and review of laboratory studies, ordering and review of radiographic studies, pulse oximetry, re-evaluation of patient's condition and review of old charts Ultrasound ED Peripheral IV (Provider)  Date/Time: 05/16/2021 9:05 PM Performed by: Elnora Morrison, MD Authorized by: Elnora Morrison, MD   Procedure details:    Indications: multiple failed IV attempts     Location:  Left AC   Angiocath:  18 G   Bedside Ultrasound Guided: Yes     Images: archived     Patient tolerated procedure without complications: Yes       Elnora Morrison, MD 05/17/21 [phone]

## 2021-05-16 NOTE — ED Notes (Signed)
Seizure like activity provider at bedside meds ordered

## 2021-05-16 NOTE — ED Notes (Signed)
Seizure like activity provider at bedside
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
## 2021-05-16 NOTE — Code Documentation (Addendum)
Responded to Code Stroke called at 2010 for L sided weakness and gaze, [id number]. Pt arrived at 2012, NIH-22, CBG-108, CT head-negative for acute changes. While attempting to get PIV via U/S for plans of CTA/CTP, pt began to twitching R arm/R leg. CTA/CTP d/c'd and '4mg'$  versed given at 2049. Pt taken back to ED room where 4g keppra started per MD order. Pt continued to seize so additional '4mg'$  versed given at 2010. Code Stroke cancelled at 2124. Plan q29mneuro x 2 hours, q342m x 2 hours.

## 2021-05-17 ENCOUNTER — Inpatient Hospital Stay (HOSPITAL_COMMUNITY): Payer: Medicare Other

## 2021-05-17 ENCOUNTER — Observation Stay (HOSPITAL_COMMUNITY): Payer: Medicare Other

## 2021-05-17 DIAGNOSIS — R509 Fever, unspecified: Secondary | ICD-10-CM | POA: Diagnosis not present

## 2021-05-17 DIAGNOSIS — I720 Aneurysm of carotid artery: Secondary | ICD-10-CM | POA: Diagnosis not present

## 2021-05-17 DIAGNOSIS — R296 Repeated falls: Secondary | ICD-10-CM | POA: Diagnosis not present

## 2021-05-17 DIAGNOSIS — R1310 Dysphagia, unspecified: Secondary | ICD-10-CM | POA: Diagnosis not present

## 2021-05-17 DIAGNOSIS — I7771 Dissection of carotid artery: Secondary | ICD-10-CM | POA: Diagnosis not present

## 2021-05-17 DIAGNOSIS — I6523 Occlusion and stenosis of bilateral carotid arteries: Secondary | ICD-10-CM | POA: Diagnosis not present

## 2021-05-17 DIAGNOSIS — X58XXXS Exposure to other specified factors, sequela: Secondary | ICD-10-CM | POA: Diagnosis present

## 2021-05-17 DIAGNOSIS — M6282 Rhabdomyolysis: Secondary | ICD-10-CM | POA: Diagnosis present

## 2021-05-17 DIAGNOSIS — R4182 Altered mental status, unspecified: Secondary | ICD-10-CM

## 2021-05-17 DIAGNOSIS — E785 Hyperlipidemia, unspecified: Secondary | ICD-10-CM | POA: Diagnosis not present

## 2021-05-17 DIAGNOSIS — Z95820 Peripheral vascular angioplasty status with implants and grafts: Secondary | ICD-10-CM | POA: Diagnosis not present

## 2021-05-17 DIAGNOSIS — Z743 Need for continuous supervision: Secondary | ICD-10-CM | POA: Diagnosis not present

## 2021-05-17 DIAGNOSIS — D320 Benign neoplasm of cerebral meninges: Secondary | ICD-10-CM | POA: Diagnosis present

## 2021-05-17 DIAGNOSIS — E440 Moderate protein-calorie malnutrition: Secondary | ICD-10-CM | POA: Diagnosis not present

## 2021-05-17 DIAGNOSIS — I69351 Hemiplegia and hemiparesis following cerebral infarction affecting right dominant side: Secondary | ICD-10-CM | POA: Diagnosis not present

## 2021-05-17 DIAGNOSIS — G8384 Todd's paralysis (postepileptic): Secondary | ICD-10-CM | POA: Diagnosis present

## 2021-05-17 DIAGNOSIS — I472 Ventricular tachycardia, unspecified: Secondary | ICD-10-CM | POA: Diagnosis not present

## 2021-05-17 DIAGNOSIS — S06300A Unspecified focal traumatic brain injury without loss of consciousness, initial encounter: Secondary | ICD-10-CM | POA: Diagnosis not present

## 2021-05-17 DIAGNOSIS — R531 Weakness: Secondary | ICD-10-CM | POA: Diagnosis not present

## 2021-05-17 DIAGNOSIS — L89159 Pressure ulcer of sacral region, unspecified stage: Secondary | ICD-10-CM | POA: Diagnosis not present

## 2021-05-17 DIAGNOSIS — F1020 Alcohol dependence, uncomplicated: Secondary | ICD-10-CM

## 2021-05-17 DIAGNOSIS — R5381 Other malaise: Secondary | ICD-10-CM | POA: Diagnosis not present

## 2021-05-17 DIAGNOSIS — G9349 Other encephalopathy: Secondary | ICD-10-CM | POA: Diagnosis present

## 2021-05-17 DIAGNOSIS — I70202 Unspecified atherosclerosis of native arteries of extremities, left leg: Secondary | ICD-10-CM | POA: Diagnosis present

## 2021-05-17 DIAGNOSIS — K2100 Gastro-esophageal reflux disease with esophagitis, without bleeding: Secondary | ICD-10-CM | POA: Diagnosis not present

## 2021-05-17 DIAGNOSIS — G40909 Epilepsy, unspecified, not intractable, without status epilepticus: Secondary | ICD-10-CM | POA: Diagnosis not present

## 2021-05-17 DIAGNOSIS — I6330 Cerebral infarction due to thrombosis of unspecified cerebral artery: Secondary | ICD-10-CM | POA: Diagnosis not present

## 2021-05-17 DIAGNOSIS — J449 Chronic obstructive pulmonary disease, unspecified: Secondary | ICD-10-CM | POA: Diagnosis not present

## 2021-05-17 DIAGNOSIS — F32A Depression, unspecified: Secondary | ICD-10-CM | POA: Diagnosis not present

## 2021-05-17 DIAGNOSIS — R1312 Dysphagia, oropharyngeal phase: Secondary | ICD-10-CM | POA: Diagnosis not present

## 2021-05-17 DIAGNOSIS — R29898 Other symptoms and signs involving the musculoskeletal system: Secondary | ICD-10-CM | POA: Diagnosis not present

## 2021-05-17 DIAGNOSIS — R2689 Other abnormalities of gait and mobility: Secondary | ICD-10-CM | POA: Diagnosis not present

## 2021-05-17 DIAGNOSIS — R29818 Other symptoms and signs involving the nervous system: Secondary | ICD-10-CM | POA: Diagnosis not present

## 2021-05-17 DIAGNOSIS — G459 Transient cerebral ischemic attack, unspecified: Secondary | ICD-10-CM | POA: Diagnosis not present

## 2021-05-17 DIAGNOSIS — R569 Unspecified convulsions: Secondary | ICD-10-CM | POA: Diagnosis not present

## 2021-05-17 DIAGNOSIS — R4701 Aphasia: Secondary | ICD-10-CM | POA: Diagnosis present

## 2021-05-17 DIAGNOSIS — Z7289 Other problems related to lifestyle: Secondary | ICD-10-CM | POA: Diagnosis not present

## 2021-05-17 DIAGNOSIS — G40101 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, not intractable, with status epilepticus: Secondary | ICD-10-CM | POA: Diagnosis not present

## 2021-05-17 DIAGNOSIS — Z681 Body mass index (BMI) 19 or less, adult: Secondary | ICD-10-CM | POA: Diagnosis not present

## 2021-05-17 DIAGNOSIS — Z20822 Contact with and (suspected) exposure to covid-19: Secondary | ICD-10-CM | POA: Diagnosis present

## 2021-05-17 DIAGNOSIS — Z7982 Long term (current) use of aspirin: Secondary | ICD-10-CM | POA: Diagnosis not present

## 2021-05-17 DIAGNOSIS — F17210 Nicotine dependence, cigarettes, uncomplicated: Secondary | ICD-10-CM | POA: Diagnosis not present

## 2021-05-17 DIAGNOSIS — I63233 Cerebral infarction due to unspecified occlusion or stenosis of bilateral carotid arteries: Secondary | ICD-10-CM | POA: Diagnosis not present

## 2021-05-17 DIAGNOSIS — I639 Cerebral infarction, unspecified: Secondary | ICD-10-CM | POA: Diagnosis not present

## 2021-05-17 DIAGNOSIS — I6503 Occlusion and stenosis of bilateral vertebral arteries: Secondary | ICD-10-CM | POA: Diagnosis not present

## 2021-05-17 DIAGNOSIS — M6281 Muscle weakness (generalized): Secondary | ICD-10-CM | POA: Diagnosis not present

## 2021-05-17 DIAGNOSIS — I719 Aortic aneurysm of unspecified site, without rupture: Secondary | ICD-10-CM | POA: Diagnosis present

## 2021-05-17 DIAGNOSIS — I69398 Other sequelae of cerebral infarction: Secondary | ICD-10-CM | POA: Diagnosis not present

## 2021-05-17 DIAGNOSIS — G9340 Encephalopathy, unspecified: Secondary | ICD-10-CM | POA: Diagnosis not present

## 2021-05-17 DIAGNOSIS — N179 Acute kidney failure, unspecified: Secondary | ICD-10-CM | POA: Diagnosis not present

## 2021-05-17 DIAGNOSIS — S065X0S Traumatic subdural hemorrhage without loss of consciousness, sequela: Secondary | ICD-10-CM | POA: Diagnosis not present

## 2021-05-17 DIAGNOSIS — I671 Cerebral aneurysm, nonruptured: Secondary | ICD-10-CM | POA: Diagnosis not present

## 2021-05-17 DIAGNOSIS — R414 Neurologic neglect syndrome: Secondary | ICD-10-CM | POA: Diagnosis present

## 2021-05-17 DIAGNOSIS — I672 Cerebral atherosclerosis: Secondary | ICD-10-CM | POA: Diagnosis not present

## 2021-05-17 DIAGNOSIS — E876 Hypokalemia: Secondary | ICD-10-CM | POA: Diagnosis present

## 2021-05-17 DIAGNOSIS — M4854XA Collapsed vertebra, not elsewhere classified, thoracic region, initial encounter for fracture: Secondary | ICD-10-CM | POA: Diagnosis present

## 2021-05-17 DIAGNOSIS — R278 Other lack of coordination: Secondary | ICD-10-CM | POA: Diagnosis not present

## 2021-05-17 DIAGNOSIS — I10 Essential (primary) hypertension: Secondary | ICD-10-CM | POA: Diagnosis not present

## 2021-05-17 DIAGNOSIS — I63512 Cerebral infarction due to unspecified occlusion or stenosis of left middle cerebral artery: Secondary | ICD-10-CM | POA: Diagnosis present

## 2021-05-17 LAB — URINALYSIS, COMPLETE (UACMP) WITH MICROSCOPIC
Glucose, UA: NEGATIVE mg/dL
Ketones, ur: 15 mg/dL — AB
Leukocytes,Ua: NEGATIVE
Nitrite: NEGATIVE
Protein, ur: NEGATIVE mg/dL
Specific Gravity, Urine: >1.03 — ABNORMAL HIGH (ref 1.005–1.030)
pH: 6 (ref 5.0–8.0)

## 2021-05-17 LAB — BASIC METABOLIC PANEL
Anion gap: 13 (ref 5–15)
BUN: 9 mg/dL (ref 8–23)
CO2: 21 mmol/L — ABNORMAL LOW (ref 22–32)
Calcium: 8.8 mg/dL — ABNORMAL LOW (ref 8.9–10.3)
Chloride: 102 mmol/L (ref 98–111)
Creatinine, Ser: 1.26 mg/dL — ABNORMAL HIGH (ref 0.61–1.24)
GFR, Estimated: >60 mL/min (ref >60)
Glucose, Bld: 101 mg/dL — ABNORMAL HIGH (ref 70–99)
Potassium: 4.6 mmol/L (ref 3.5–5.1)
Sodium: 136 mmol/L (ref 135–145)

## 2021-05-17 LAB — CBC
HCT: 33.9 % — ABNORMAL LOW (ref 39.0–52.0)
Hemoglobin: 10.7 g/dL — ABNORMAL LOW (ref 13.0–17.0)
MCH: 28.8 pg (ref 26.0–34.0)
MCHC: 31.6 g/dL (ref 30.0–36.0)
MCV: 91.4 fL (ref 80.0–100.0)
Platelets: 150 10*3/uL (ref 150–400)
RBC: 3.71 MIL/uL — ABNORMAL LOW (ref 4.22–5.81)
RDW: 19.8 % — ABNORMAL HIGH (ref 11.5–15.5)
WBC: 7.8 10*3/uL (ref 4.0–10.5)
nRBC: 0.5 % — ABNORMAL HIGH (ref 0.0–0.2)

## 2021-05-17 LAB — MAGNESIUM: Magnesium: 2.1 mg/dL (ref 1.7–2.4)

## 2021-05-17 LAB — PHOSPHORUS: Phosphorus: 3.2 mg/dL (ref 2.5–4.6)

## 2021-05-17 LAB — CK: Total CK: 5214 U/L — ABNORMAL HIGH (ref 49–397)

## 2021-05-17 IMAGING — CT CT ANGIO HEAD-NECK (W OR W/O PERF)
1 of 14 series · 11 of 47 positions shown · IV contrast (omnipaque)
Comparison: Prior MRI from earlier the same day as well as previous
CT from [DATE].

CLINICAL DATA: Follow-up examination for acute stroke.

EXAM:
CT ANGIOGRAPHY HEAD AND NECK
TECHNIQUE: Multidetector CT imaging of the head and neck was performed using
the standard protocol during bolus administration of intravenous
contrast. Multiplanar CT image reconstructions and MIPs were
obtained to evaluate the vascular anatomy. Carotid stenosis
measurements (when applicable) are obtained utilizing NASCET
criteria, using the distal internal carotid diameter as the
denominator.
CONTRAST:  100mL OMNIPAQUE IOHEXOL 350 MG/ML SOLN

[Series 14: thin · axial · 0.54mm/px · z∈[-538,-230]mm · 11 of 741 slices shown]
[im 62/741  brain]
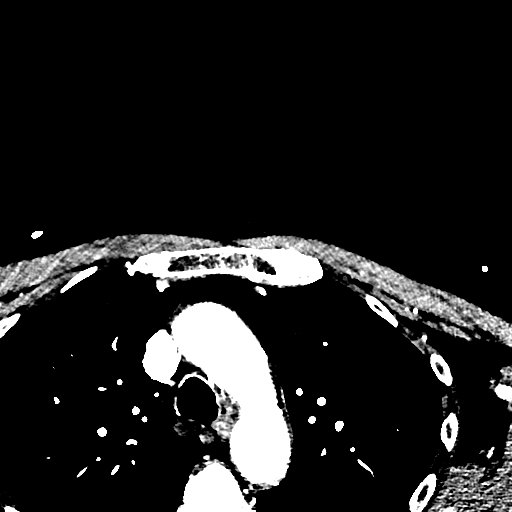
[im 124/741  bone]
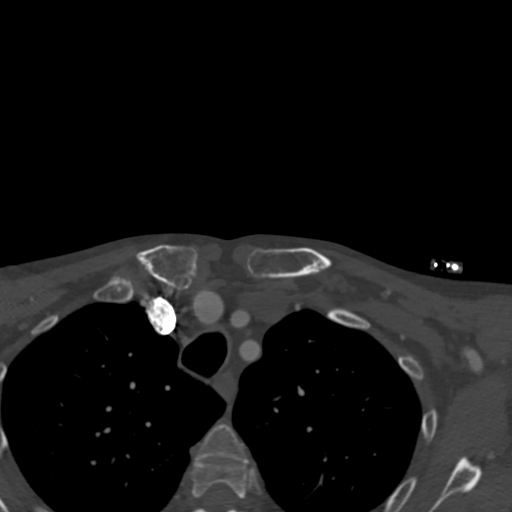
[im 186/741  brain]
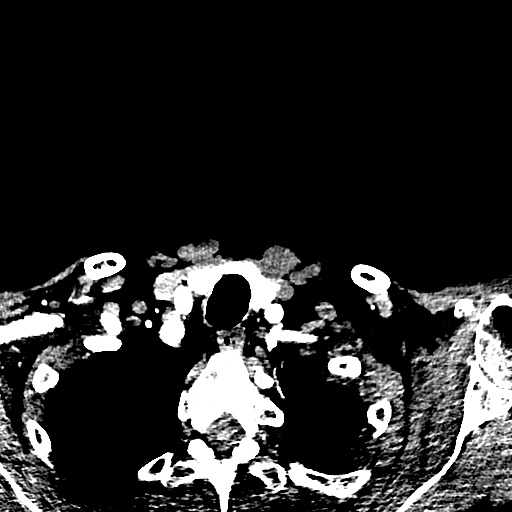
[im 247/741  bone]
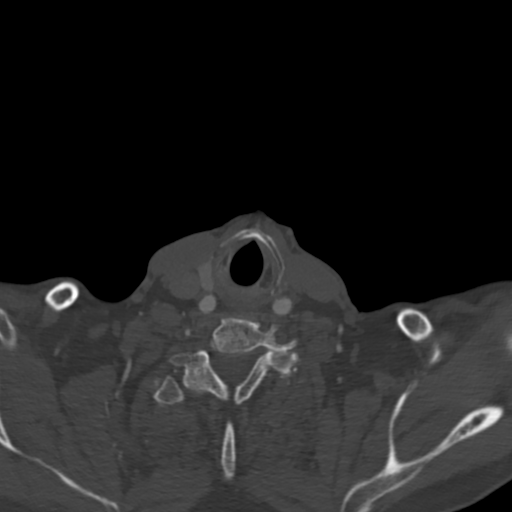
[im 309/741  brain]
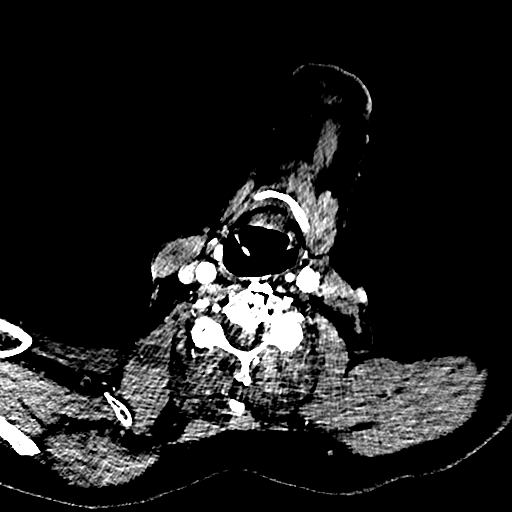
[im 371/741  bone]
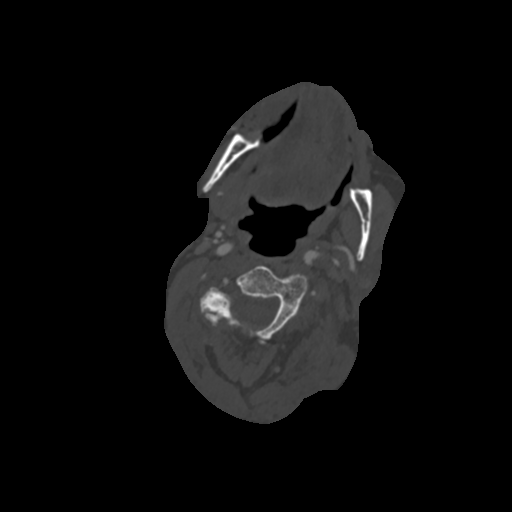
[im 432/741  brain]
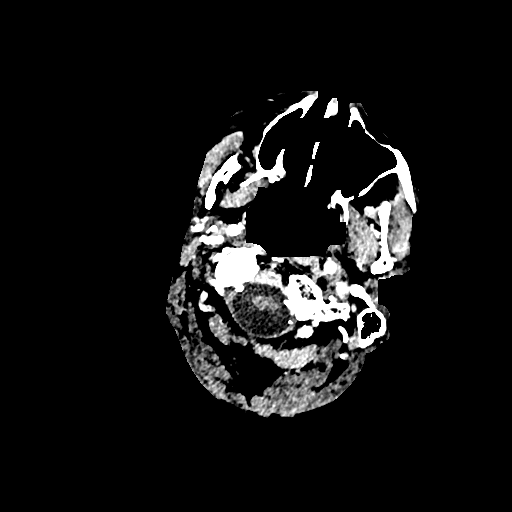
[im 494/741  bone]
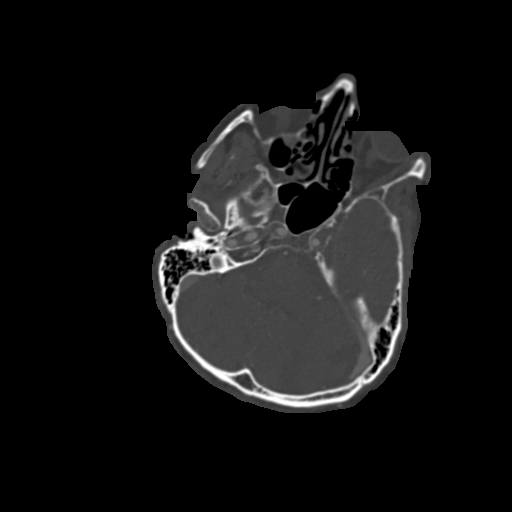
[im 556/741  brain]
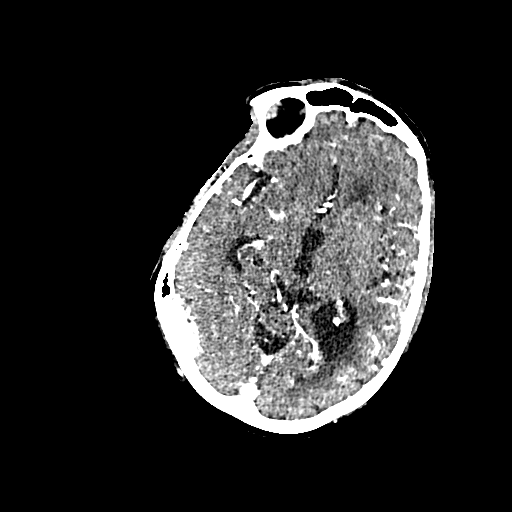
[im 617/741  bone]
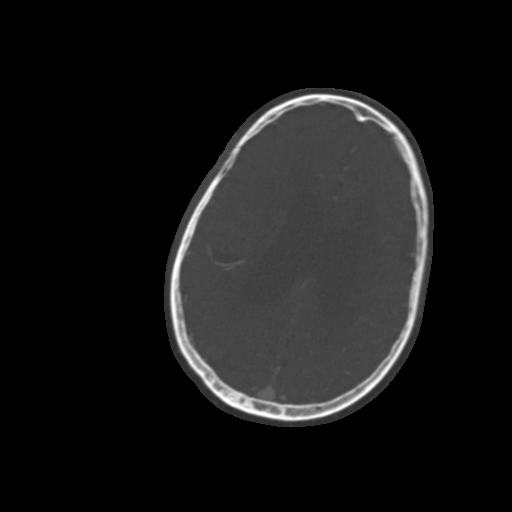
[im 679/741  brain]
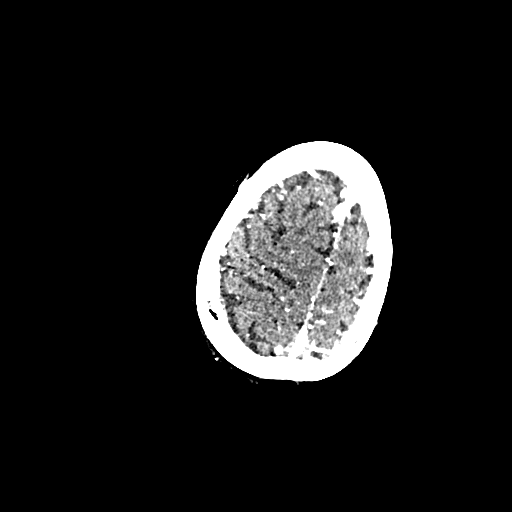

[11 of 47 positions shown; findings below may reference images not displayed]

FINDINGS: CT HEAD FINDINGS

Brain: Moderately advanced age-related cerebral atrophy. Patchy
hypodensity involving the periventricular and deep white matter both
cerebral hemispheres most consistent with chronic small vessel
ischemic disease. Multiple scattered remote bilateral cerebellar
infarcts.

Previously identified small left frontal infarct not visible by CT.
No other acute large vessel territory infarct. No acute intracranial
hemorrhage. 1.4 cm right parafalcine meningioma without associated
mass effect, stable. No other visible mass lesion or mass effect.
Diffuse ventriculomegaly, stable from prior. No extra-axial fluid
collection.

Vascular: No hyperdense vessel. Calcified atherosclerosis present at
skull base.

Skull: Scalp soft tissues and calvarium within normal limits.

Sinuses: Paranasal sinuses and mastoid air cells are clear.

Orbits: Globes and orbital soft tissues demonstrate no acute
finding.

Review of the MIP images confirms the above findings

CTA NECK FINDINGS

Aortic arch: Visualized aortic arch normal in caliber with normal 3
vessel morphology. Mild-to-moderate atheromatous change within the
arch itself. No hemodynamically significant stenosis seen about the
origin of the great vessels.

Right carotid system: Scattered nonstenotic soft plaque seen within
the right CCA. Bulky calcified plaque about the right carotid
bulb/proximal right ICA without hemodynamically significant
stenosis. Distally, there is mild smooth narrowing of the mid
cervical right ICA. Adjacent thrombosed focal outpouching with
peripheral calcification (series 14, image 411). Appearance is
suggestive of a dissecting pseudoaneurysm, likely chronic in nature.
No acute appearing intraluminal thrombus or frank raised dissection
flap. Thrombosed aneurysm measures approximately 5 x 6 x 12 mm in
size. Associated stenosis is mild measuring no more than 35%.
Distally, right ICA widely patent to the skull base.

Left carotid system: Scattered nonstenotic plaque noted within the
left CCA. Bulky calcified plaque about the left carotid
bulb/proximal left ICA without hemodynamically significant stenosis.
There is a similar but more prominent dissecting pseudoaneurysm
involving the contralateral mid cervical left ICA (series 14, image
400). This pseudoaneurysm is also largely thrombosed, although a
small area of persistent filling is seen at its superior aspect
(series 14, image 419). Aneurysm itself measures approximately 14 x
11 x 19 mm. There is intimal irregularity about the adjacent
cervical left ICA at this level, suggesting a possible acute
component/thrombus (series 14, image 400). Associated stenosis
measures up to approximately 50% by NASCET criteria. Distally, the
left ICA is otherwise patent to the skull base.

Vertebral arteries: Both vertebral arteries arise from the
subclavian arteries. No proximal subclavian artery stenosis. Right
vertebral artery slightly dominant. Atheromatous change at the
origins of both vertebral arteries with associated severe ostial
stenosis on the right (series 14, image 217). No more than mild
stenosis on the left. Otherwise, vertebral arteries patent without
stenosis, dissection or occlusion.

Skeleton: Mild compression deformities noted involving the superior
endplates of T4 and T5, somewhat age indeterminate, but new as
compared to priors chest CT from [DATE] (series 18, image 143).
No other visible acute osseous finding. Prior ACDF at C3-4 with
solid arthrodesis. No discrete or worrisome osseous lesions. Patient
is edentulous.

Other neck: No other acute soft tissue abnormality within the neck.
No mass or adenopathy.

Upper chest: Scattered centrilobular emphysema. 8 mm nodule present
along the right major fissure (series 18, image 117).

Review of the MIP images confirms the above findings

CTA HEAD FINDINGS

Anterior circulation: Petrous segments patent bilaterally. Scattered
atheromatous change throughout the carotid siphons with associated
mild to moderate multifocal narrowing. A1 segments patent
bilaterally. Normal anterior communicating artery complex. Anterior
cerebral arteries patent to their distal aspects without
hemodynamically significant stenosis. M1 segments patent without
stenosis or occlusion. Normal MCA bifurcations. Distal MCA branches
well perfused and symmetric.

Posterior circulation: Diminutive vertebral arteries patent to the
vertebrobasilar junction without stenosis. Both PICA origins patent
and normal. Basilar diffusely diminutive but patent to its distal
aspect. Superior cerebellar arteries patent bilaterally. Predominant
fetal type origin of both PCAs. PCAs mildly irregular but patent to
their distal aspects without high-grade stenosis.

Venous sinuses: Grossly patent allowing for timing the contrast
bolus.

Anatomic variants: Fetal type origin of the PCAs with overall
diminutive vertebrobasilar system.

Review of the MIP images confirms the above findings
IMPRESSION: CT HEAD IMPRESSION:

1. Stable head CT. Previously identified small left frontal infarct
not visible by CT. No other acute intracranial abnormality.
2. Advanced cerebral atrophy with chronic microvascular ischemic
disease, with multiple chronic bilateral cerebellar infarcts.
3. 1.4 cm right parafalcine meningioma without mass effect.

CTA HEAD AND NECK IMPRESSION:

1. Negative CTA for large vessel occlusion.
2. Probable dissecting pseudoaneurysms involving the mid cervical
ICAs bilaterally, left larger than right. Associated stenoses
measures up to approximately 50% on the left and 35% on the right.
The right pseudoaneurysm is largely chronic in appearance, while the
left demonstrates associated intimal irregularity, suggesting
intraluminal thrombus related to an acute component.
3. Atheromatous change throughout the carotid siphons with
associated mild to moderate multifocal narrowing.
4. Fetal type origin of the PCAs with overall diminutive
vertebrobasilar system. Severe ostial stenosis at the origin of the
right vertebral artery.
5. Mild compression deformities involving the superior endplates of
T4 and T5, age indeterminate, but new as compared to prior chest CT
from [DATE]. Correlation with physical exam recommended.
6. 8 mm right upper lobe nodule, indeterminate. Non-contrast chest
CT at 6-12 months is recommended. If the nodule is stable at time of
repeat CT, then future CT at 18-24 months (from today's scan) is
considered optional for low-risk patients, but is recommended for
high-risk patients. This recommendation follows the consensus
statement: Guidelines for Management of Incidental Pulmonary Nodules
Detected on CT Images: From the [HOSPITAL] [SB]; Radiology
[SB]; [DATE].
7. Aortic Atherosclerosis ([SB]-[SB]) and Emphysema ([SB]-[SB]).

These results were communicated to Dr. P-A at [DATE] on
[DATE] by text page via the AMION messaging system.

## 2021-05-17 IMAGING — MR MR HEAD W/O CM
12 of 14 series · 44 of 48 positions shown · non-contrast
Comparison: Noncontrast CT head obtained 1 day prior

CLINICAL DATA: Right-sided weakness, altered mental status

EXAM:
MRI HEAD WITHOUT CONTRAST
TECHNIQUE: Multiplanar, multiecho pulse sequences of the brain and surrounding
structures were obtained without intravenous contrast.

[Series 3: DWI · axial · 3.0mm · 1.09mm/px · z∈[-23,+123]mm · 6 of 100 slices shown (1 of 8)]
[im 1/100]
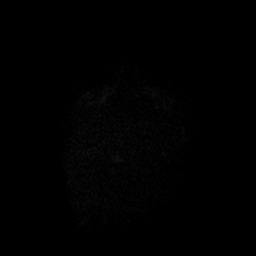
[im 20/100]
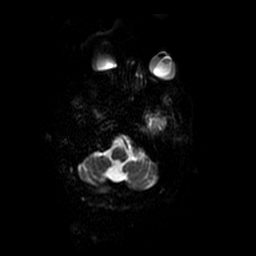
[im 40/100]
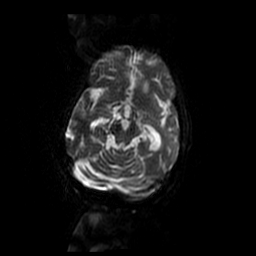
[im 60/100]
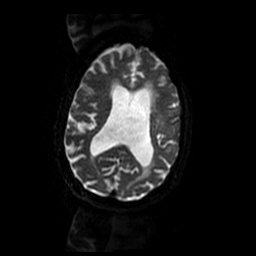
[im 80/100]
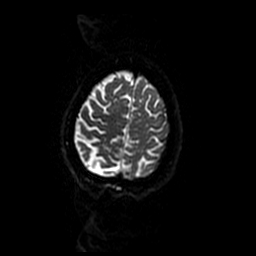
[im 100/100]
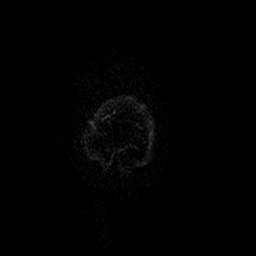

[Series 7: DWI · coronal · 5.0mm · 1.09mm/px · 4 of 70 slices shown (2 of 8)]
[im 1/70]
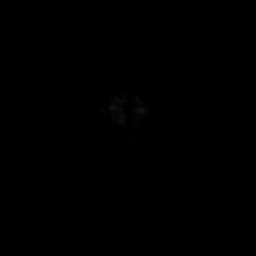
[im 24/70]
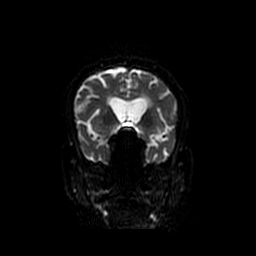
[im 47/70]
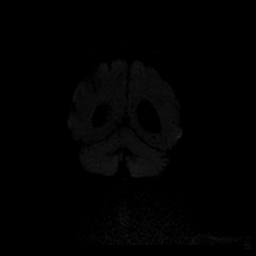
[im 70/70]
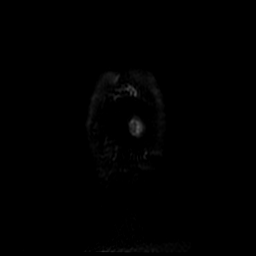

[Series 8: FLAIR · axial · 5.0mm · 0.43mm/px · z∈[-45,+104]mm · 2 of 26 slices shown]
[im 1/26]
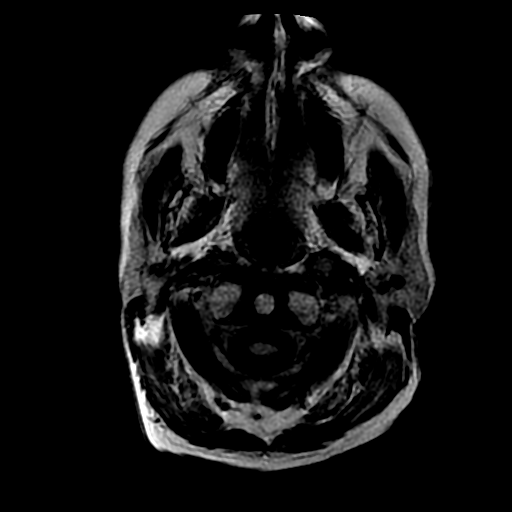
[im 26/26]
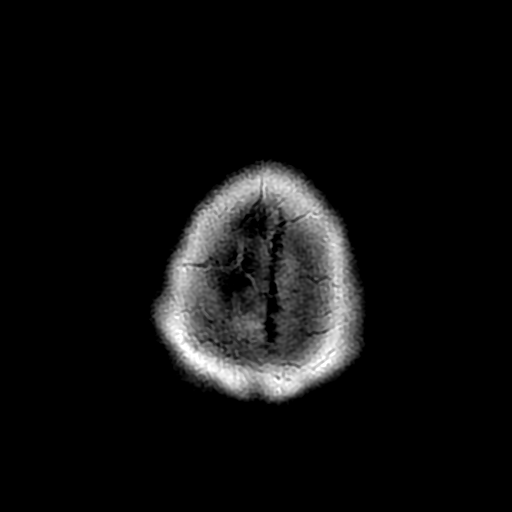

[Series 10: T1 · sagittal · 5.0mm · 0.47mm/px · 2 of 23 slices shown]
[im 1/23]
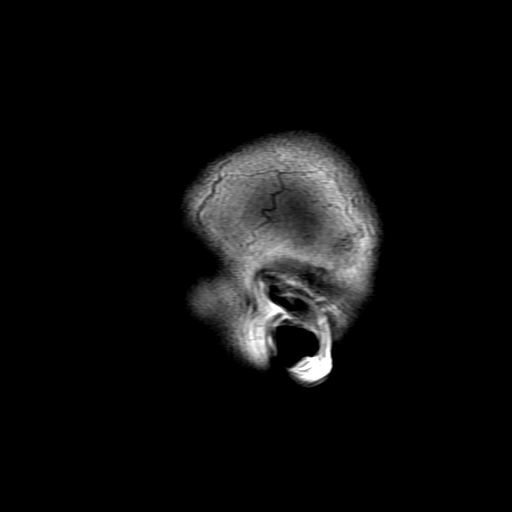
[im 23/23]
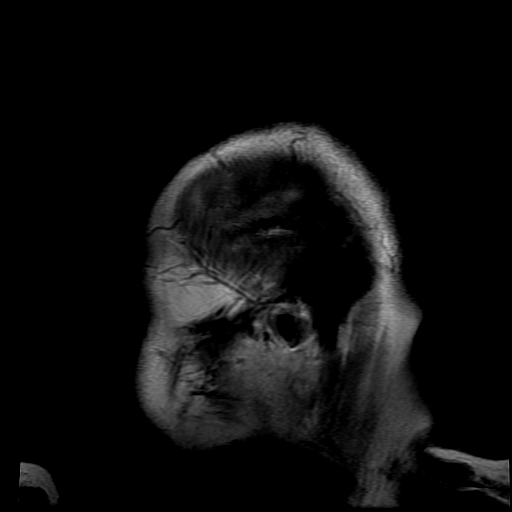

[Series 13: T2 · coronal · 5.0mm · 0.43mm/px · 2 of 24 slices shown (1 of 2)]
[im 1/24]
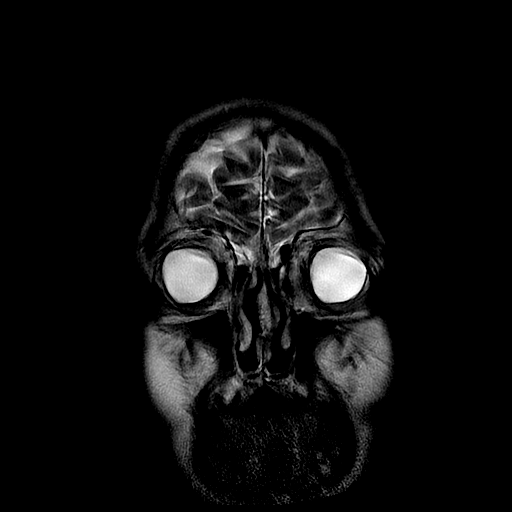
[im 24/24]
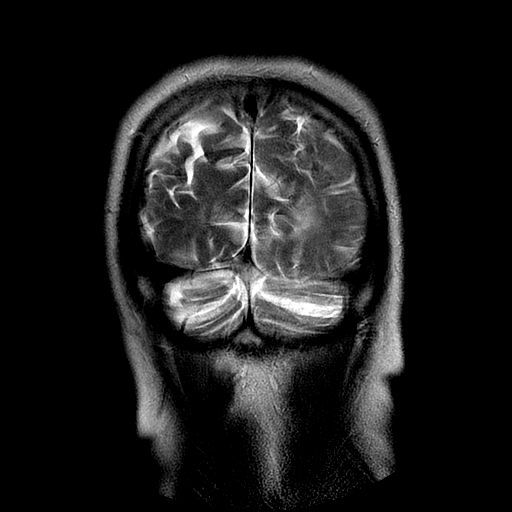

[Series 16: DWI · axial · 3.0mm · 1.09mm/px · z∈[-24,+125]mm · 7 of 102 slices shown (3 of 8)]
[im 1/102]
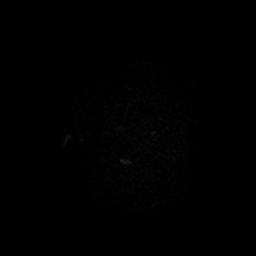
[im 17/102]
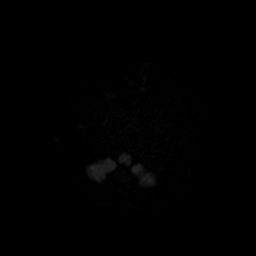
[im 34/102]
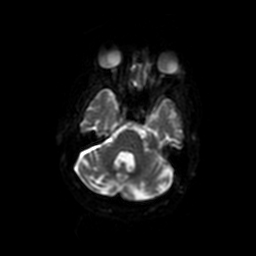
[im 51/102]
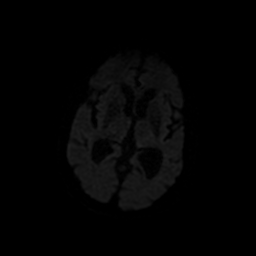
[im 68/102]
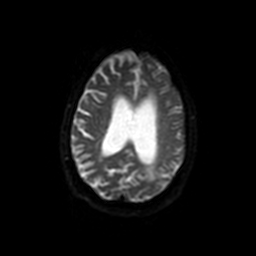
[im 85/102]
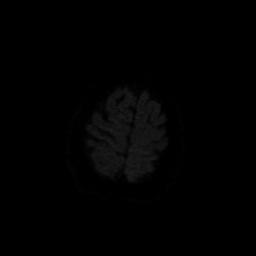
[im 102/102]
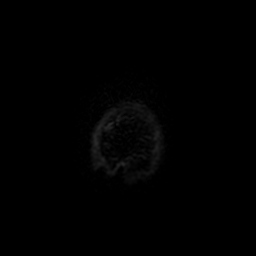

[Series 17: DWI · coronal · 5.0mm · 1.09mm/px · 6 of 82 slices shown (4 of 8)]
[im 1/82]
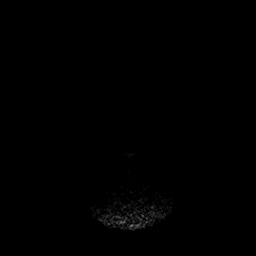
[im 17/82]
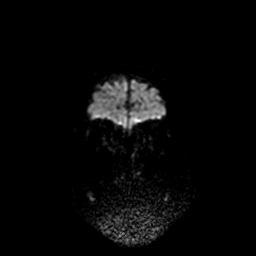
[im 33/82]
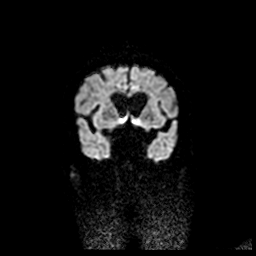
[im 49/82]
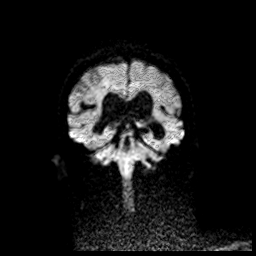
[im 65/82]
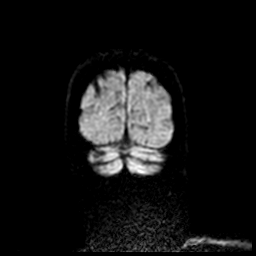
[im 82/82]
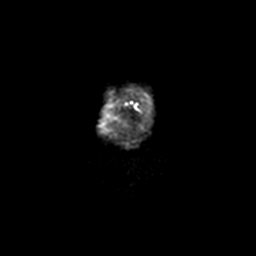

[Series 18: T2 · axial · 5.0mm · 0.47mm/px · z∈[-11,+127]mm · 2 of 24 slices shown (2 of 2)]
[im 1/24]
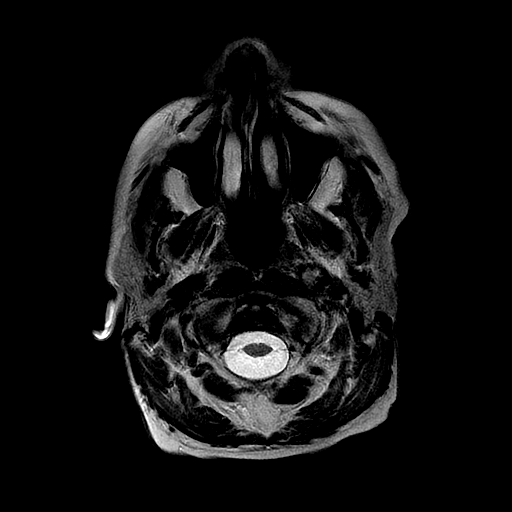
[im 24/24]
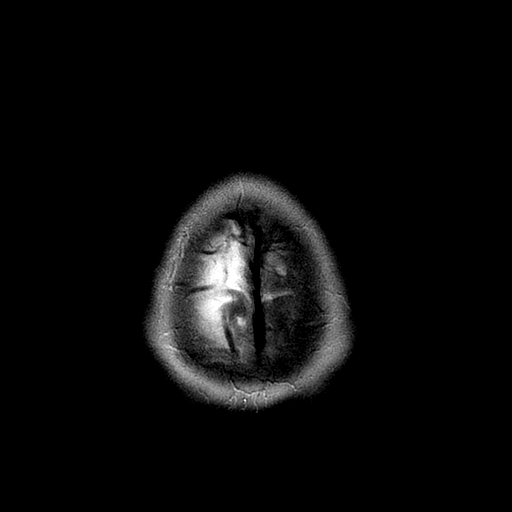

[Series 300: DWI · axial · 3.0mm · 1.09mm/px · z∈[-23,+123]mm · 4 of 50 slices shown (5 of 8)]
[im 1/50]
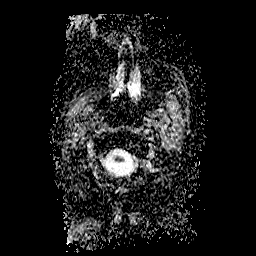
[im 17/50]
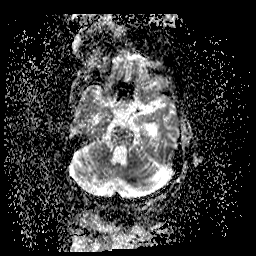
[im 33/50]
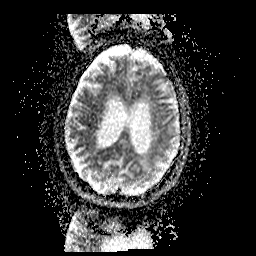
[im 50/50]
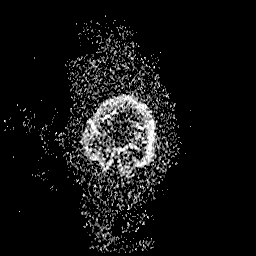

[Series 700: DWI · coronal · 5.0mm · 1.09mm/px · 2 of 35 slices shown (6 of 8)]
[im 1/35]
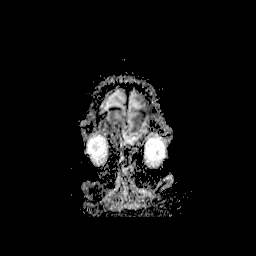
[im 35/35]
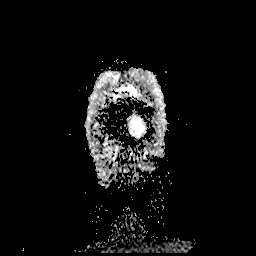

[Series 1600: DWI · axial · 3.0mm · 1.09mm/px · z∈[-24,+125]mm · 4 of 51 slices shown (7 of 8)]
[im 1/51]
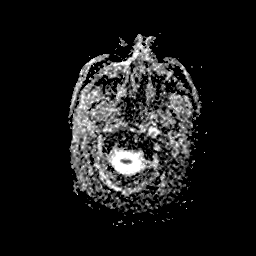
[im 17/51]
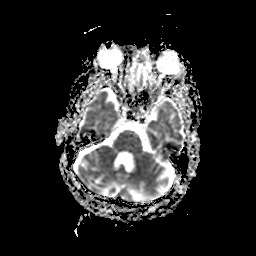
[im 34/51]
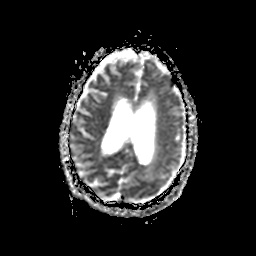
[im 51/51]
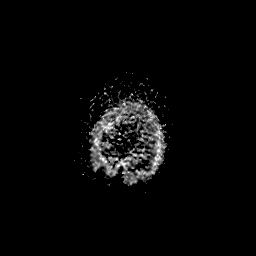

[Series 1700: DWI · coronal · 5.0mm · 1.09mm/px · 3 of 40 slices shown (8 of 8)]
[im 1/40]
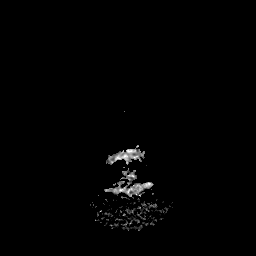
[im 20/40]
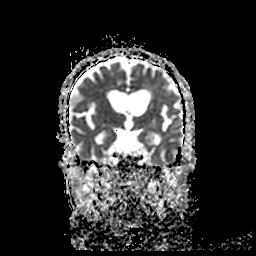
[im 40/40]
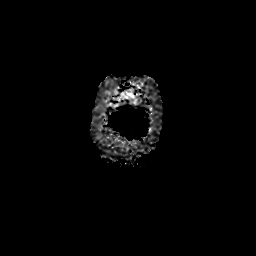

[44 of 48 positions shown; findings below may reference images not displayed]

FINDINGS: Image quality is moderately motion degraded.

Brain: There are small foci of diffusion restriction in the left
frontal lobe inferolaterally. There is no other evidence of acute
infarct.

There is no acute intracranial hemorrhage or extra-axial fluid
collection.

There are multiple remote lacunar infarcts in the cerebellar
hemispheres and right basal ganglia. There is moderate parenchymal
volume loss with ex vacuo enlargement of the ventricular system.
There is mild FLAIR signal abnormality in the subcortical and
periventricular white matter likely reflecting sequela of chronic
white matter microangiopathy.

A 1.0 cm right parafalcine lesion likely reflecting a meningioma is
stable. There is no new solid mass lesion. There is no midline
shift.

Vascular: Normal flow voids.

Skull and upper cervical spine: Normal marrow signal.

Sinuses/Orbits: The imaged paranasal sinuses are clear. The globes
and orbits are unremarkable.

Other: None.
IMPRESSION: 1. Small acute infarct in the left frontal lobe inferolaterally.
2. Moderate global parenchymal volume loss with enlargement of the
ventricular system, advanced for age.
3. Stable 1.0 cm right parafalcine meningioma.

## 2021-05-17 MED ORDER — ENOXAPARIN SODIUM 40 MG/0.4ML IJ SOSY
40.0000 mg | PREFILLED_SYRINGE | Freq: Every day | INTRAMUSCULAR | Status: DC
  Administered 2021-05-17 – 2021-05-23 (×8): 40 mg via SUBCUTANEOUS
  Filled 2021-05-17 (×8): qty 0.4

## 2021-05-17 MED ORDER — ACETAMINOPHEN 650 MG RE SUPP
650.0000 mg | Freq: Four times a day (QID) | RECTAL | Status: DC | PRN
  Administered 2021-05-17: 650 mg via RECTAL
  Filled 2021-05-17: qty 1

## 2021-05-17 MED ORDER — THIAMINE HCL 100 MG PO TABS: 100.0000 mg | ORAL_TABLET | Freq: Every day | ORAL | Status: DC

## 2021-05-17 MED ORDER — VITAMIN B-12 1000 MCG PO TABS
1000.0000 ug | ORAL_TABLET | Freq: Every day | ORAL | Status: DC
  Administered 2021-05-17: 1000 ug via ORAL
  Filled 2021-05-17: qty 1

## 2021-05-17 MED ORDER — IOHEXOL 350 MG/ML SOLN
100.0000 mL | Freq: Once | INTRAVENOUS | Status: AC | PRN
  Administered 2021-05-17: 100 mL via INTRAVENOUS

## 2021-05-17 MED ORDER — PANTOPRAZOLE SODIUM 40 MG PO TBEC
40.0000 mg | DELAYED_RELEASE_TABLET | Freq: Every day | ORAL | Status: DC
  Administered 2021-05-17: 40 mg via ORAL
  Filled 2021-05-17: qty 1

## 2021-05-17 MED ORDER — ONDANSETRON HCL 4 MG/2ML IJ SOLN: 4.0000 mg | Freq: Four times a day (QID) | INTRAMUSCULAR | Status: DC | PRN

## 2021-05-17 MED ORDER — LEVETIRACETAM 500 MG PO TABS: 1000.0000 mg | ORAL_TABLET | Freq: Two times a day (BID) | ORAL | Status: DC

## 2021-05-17 MED ORDER — ADULT MULTIVITAMIN W/MINERALS CH
1.0000 | ORAL_TABLET | Freq: Every day | ORAL | Status: DC
  Administered 2021-05-17 – 2021-05-24 (×7): 1 via ORAL
  Filled 2021-05-17 (×7): qty 1

## 2021-05-17 MED ORDER — THIAMINE HCL 100 MG/ML IJ SOLN: 100.0000 mg | Freq: Every day | INTRAMUSCULAR | Status: DC

## 2021-05-17 MED ORDER — UMECLIDINIUM BROMIDE 62.5 MCG/INH IN AEPB
1.0000 | INHALATION_SPRAY | Freq: Every day | RESPIRATORY_TRACT | Status: DC
  Administered 2021-05-19 – 2021-05-24 (×6): 1 via RESPIRATORY_TRACT
  Filled 2021-05-17: qty 7

## 2021-05-17 MED ORDER — FOLIC ACID 1 MG PO TABS
1.0000 mg | ORAL_TABLET | Freq: Every day | ORAL | Status: DC
  Administered 2021-05-17: 1 mg via ORAL
  Filled 2021-05-17: qty 1

## 2021-05-17 MED ORDER — THIAMINE HCL 100 MG/ML IJ SOLN
500.0000 mg | Freq: Three times a day (TID) | INTRAVENOUS | Status: AC
  Administered 2021-05-17 – 2021-05-19 (×8): 500 mg via INTRAVENOUS
  Filled 2021-05-17 (×10): qty 5

## 2021-05-17 MED ORDER — LORAZEPAM 1 MG PO TABS: 1.0000 mg | ORAL_TABLET | ORAL | Status: AC | PRN

## 2021-05-17 MED ORDER — LACTATED RINGERS IV SOLN: INTRAVENOUS | Status: AC

## 2021-05-17 MED ORDER — ROSUVASTATIN CALCIUM 5 MG PO TABS
10.0000 mg | ORAL_TABLET | Freq: Every day | ORAL | Status: DC
  Administered 2021-05-17: 10 mg via ORAL
  Filled 2021-05-17: qty 2

## 2021-05-17 MED ORDER — LORAZEPAM 2 MG/ML IJ SOLN
1.0000 mg | INTRAMUSCULAR | Status: AC | PRN
  Administered 2021-05-17: 1 mg via INTRAVENOUS
  Filled 2021-05-17: qty 1

## 2021-05-17 MED ORDER — ONDANSETRON HCL 4 MG PO TABS: 4.0000 mg | ORAL_TABLET | Freq: Four times a day (QID) | ORAL | Status: DC | PRN

## 2021-05-17 MED ORDER — ACETAMINOPHEN 325 MG PO TABS
650.0000 mg | ORAL_TABLET | Freq: Four times a day (QID) | ORAL | Status: DC | PRN
  Administered 2021-05-21 – 2021-05-23 (×2): 650 mg via ORAL
  Filled 2021-05-17 (×2): qty 2

## 2021-05-17 MED ORDER — FLUTICASONE FUROATE-VILANTEROL 200-25 MCG/INH IN AEPB
1.0000 | INHALATION_SPRAY | Freq: Every day | RESPIRATORY_TRACT | Status: DC
  Administered 2021-05-19 – 2021-05-24 (×6): 1 via RESPIRATORY_TRACT
  Filled 2021-05-17: qty 28

## 2021-05-17 MED ORDER — ASPIRIN 81 MG PO CHEW
81.0000 mg | CHEWABLE_TABLET | Freq: Every day | ORAL | Status: DC
  Administered 2021-05-17 – 2021-05-24 (×7): 81 mg via ORAL
  Filled 2021-05-17 (×7): qty 1

## 2021-05-17 NOTE — ED Notes (Signed)
Patient transported to MRI 

## 2021-05-17 NOTE — Plan of Care (Signed)
Neurology plan of care  The patient is awake and alert. He still remains somewhat slow to answer questions and stated this was his baseline. He was aware that he was in the hospital for seizures.   MRI brain was severely motion degraded but showed small acute infarct in the left frontal lobe inferolaterally and he will need further stroke evaluation.   Lynnae Sandhoff, MD Stroke Neurology Page: [phone]

## 2021-05-17 NOTE — ED Notes (Signed)
[name] Glaze (Daughter) called and is asking for an update when you can. Her number is [phone]
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
## 2021-05-17 NOTE — ED Notes (Signed)
Patient transported to CT 

## 2021-05-17 NOTE — ED Notes (Signed)
Pt had a bm. Was cleaned and changed noted warm to touch. Rectal temp obtained and was 101.2. admit provider sent a message reference same

## 2021-05-18 ENCOUNTER — Inpatient Hospital Stay (HOSPITAL_COMMUNITY): Payer: Medicare Other

## 2021-05-18 DIAGNOSIS — I6330 Cerebral infarction due to thrombosis of unspecified cerebral artery: Secondary | ICD-10-CM | POA: Diagnosis not present

## 2021-05-18 DIAGNOSIS — G40101 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, not intractable, with status epilepticus: Principal | ICD-10-CM

## 2021-05-18 DIAGNOSIS — R569 Unspecified convulsions: Secondary | ICD-10-CM | POA: Diagnosis not present

## 2021-05-18 DIAGNOSIS — F1020 Alcohol dependence, uncomplicated: Secondary | ICD-10-CM | POA: Diagnosis not present

## 2021-05-18 DIAGNOSIS — I720 Aneurysm of carotid artery: Secondary | ICD-10-CM

## 2021-05-18 DIAGNOSIS — G40909 Epilepsy, unspecified, not intractable, without status epilepticus: Secondary | ICD-10-CM | POA: Diagnosis not present

## 2021-05-18 LAB — MAGNESIUM: Magnesium: 1.6 mg/dL — ABNORMAL LOW (ref 1.7–2.4)

## 2021-05-18 LAB — COMPREHENSIVE METABOLIC PANEL
ALT: 18 U/L (ref 0–44)
AST: 63 U/L — ABNORMAL HIGH (ref 15–41)
Albumin: 2.8 g/dL — ABNORMAL LOW (ref 3.5–5.0)
Alkaline Phosphatase: 70 U/L (ref 38–126)
Anion gap: 12 (ref 5–15)
BUN: 5 mg/dL — ABNORMAL LOW (ref 8–23)
CO2: 20 mmol/L — ABNORMAL LOW (ref 22–32)
Calcium: 8.7 mg/dL — ABNORMAL LOW (ref 8.9–10.3)
Chloride: 104 mmol/L (ref 98–111)
Creatinine, Ser: 1.02 mg/dL (ref 0.61–1.24)
GFR, Estimated: >60 mL/min (ref >60)
Glucose, Bld: 82 mg/dL (ref 70–99)
Potassium: 3.6 mmol/L (ref 3.5–5.1)
Sodium: 136 mmol/L (ref 135–145)
Total Bilirubin: 1.3 mg/dL — ABNORMAL HIGH (ref 0.3–1.2)
Total Protein: 5.6 g/dL — ABNORMAL LOW (ref 6.5–8.1)

## 2021-05-18 LAB — RAPID URINE DRUG SCREEN, HOSP PERFORMED
Amphetamines: NOT DETECTED
Barbiturates: NOT DETECTED
Benzodiazepines: POSITIVE — AB
Cocaine: NOT DETECTED
Opiates: NOT DETECTED
Tetrahydrocannabinol: NOT DETECTED

## 2021-05-18 LAB — LIPID PANEL
Cholesterol: 152 mg/dL (ref 0–200)
HDL: 97 mg/dL (ref >40)
LDL Cholesterol: 39 mg/dL (ref 0–99)
Total CHOL/HDL Ratio: 1.6 RATIO
Triglycerides: 81 mg/dL (ref <150)
VLDL: 16 mg/dL (ref 0–40)

## 2021-05-18 LAB — URINALYSIS, ROUTINE W REFLEX MICROSCOPIC
Bilirubin Urine: NEGATIVE
Glucose, UA: NEGATIVE mg/dL
Hgb urine dipstick: NEGATIVE
Ketones, ur: NEGATIVE mg/dL
Leukocytes,Ua: NEGATIVE
Nitrite: NEGATIVE
Protein, ur: NEGATIVE mg/dL
Specific Gravity, Urine: 1.025 (ref 1.005–1.030)
pH: 6 (ref 5.0–8.0)

## 2021-05-18 LAB — PHOSPHORUS: Phosphorus: 3.3 mg/dL (ref 2.5–4.6)

## 2021-05-18 LAB — HEMOGLOBIN A1C
Hgb A1c MFr Bld: 5 % (ref 4.8–5.6)
Mean Plasma Glucose: 97 mg/dL

## 2021-05-18 LAB — CK: Total CK: 3559 U/L — ABNORMAL HIGH (ref 49–397)

## 2021-05-18 LAB — PHENOBARBITAL LEVEL: Phenobarbital: 25.4 ug/mL (ref 15.0–30.0)

## 2021-05-18 IMAGING — CT CT ANGIO HEAD-NECK (W OR W/O PERF)
3 of 4 series · 13 of 35 positions shown · IV contrast (omnipaque)
Comparison: CTA head/neck from [DATE]. CT chest [DATE].

CLINICAL DATA: Neuro deficit, acute, stroke suspected Unstable
plaque and recent focal status, SURINTO paralysis vs. L MCA occlusion

EXAM:
CT ANGIOGRAPHY HEAD AND NECK
TECHNIQUE: Multidetector CT imaging of the head and neck was performed using
the standard protocol during bolus administration of intravenous
contrast. Multiplanar CT image reconstructions and MIPs were
obtained to evaluate the vascular anatomy. Carotid stenosis
measurements (when applicable) are obtained utilizing NASCET
criteria, using the distal internal carotid diameter as the
denominator.
CONTRAST:  75mL OMNIPAQUE IOHEXOL 350 MG/ML SOLN

[Series 3: head wo · axial · 0.47mm/px · z∈[-120,-70]mm · 2 of 32 slices shown]
[im 11/32  soft-tissue]
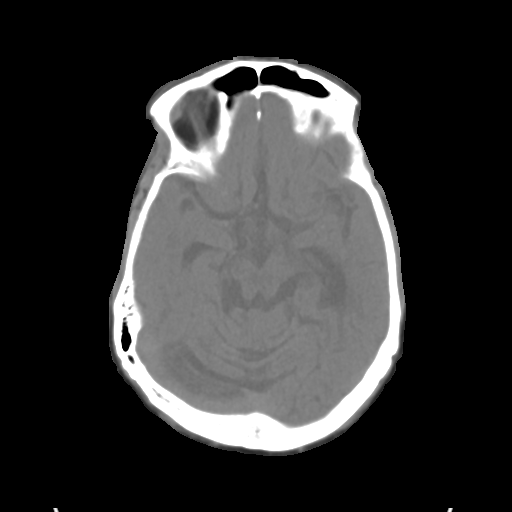
[im 21/32  bone]
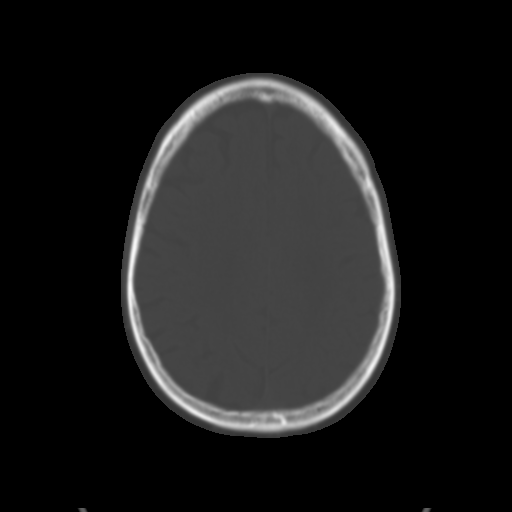

[Series 4: head bone · axial · 0.47mm/px · z∈[-154,-32]mm · 8 of 79 slices shown]
[im 9/79  bone]
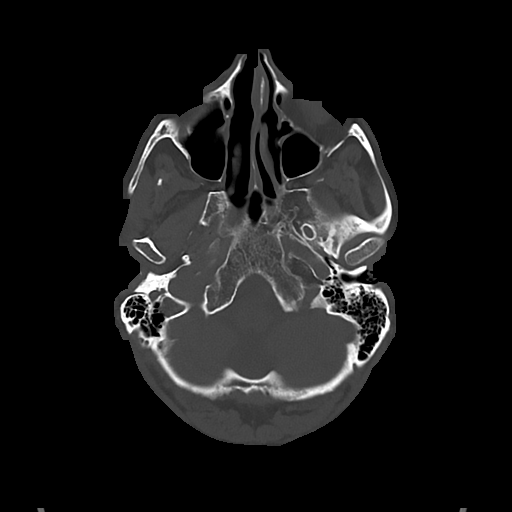
[im 18/79  bone]
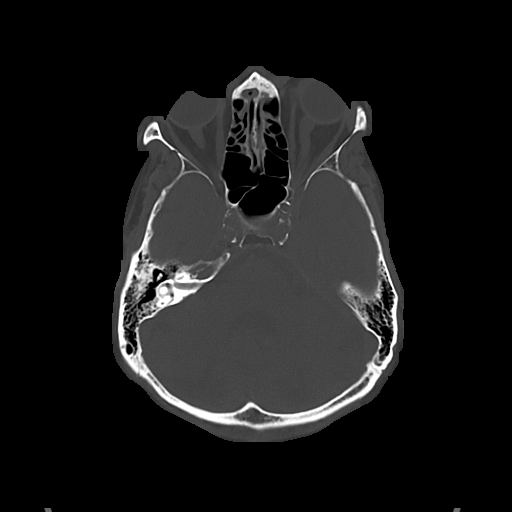
[im 27/79  bone]
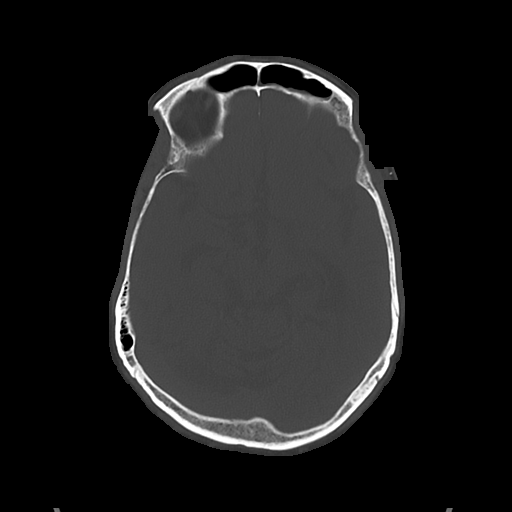
[im 35/79  bone]
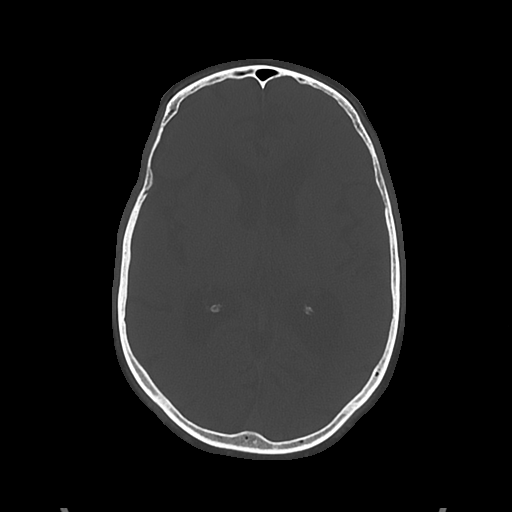
[im 44/79  bone]
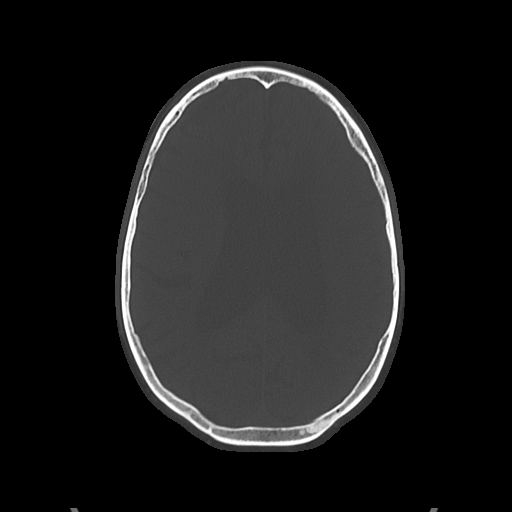
[im 53/79  bone]
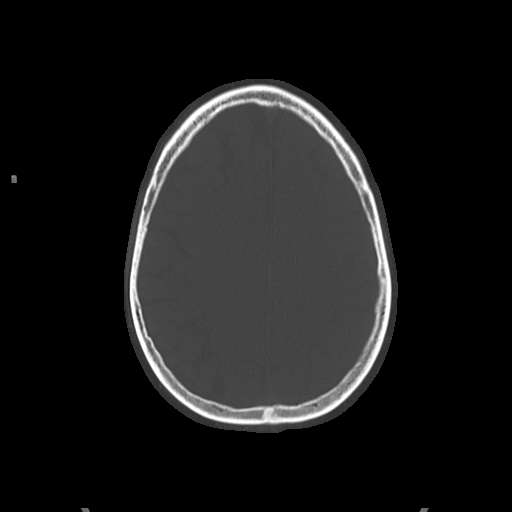
[im 61/79  bone]
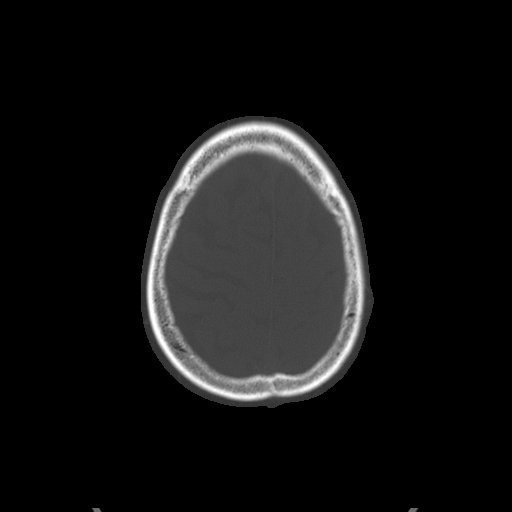
[im 70/79  bone]
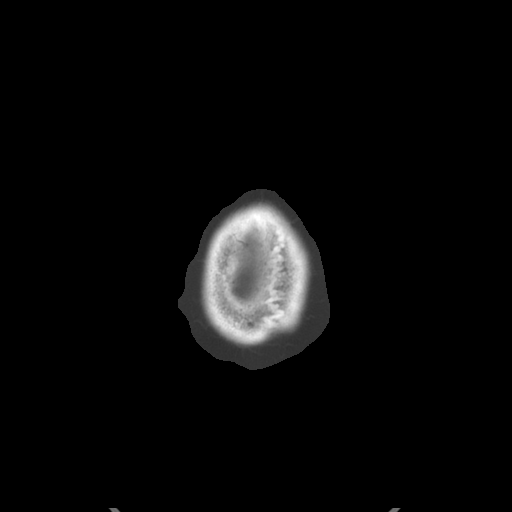

[Series 6: sag soft · sagittal · 0.29mm/px · 3 of 58 slices shown]
[im 6/58  soft-tissue]
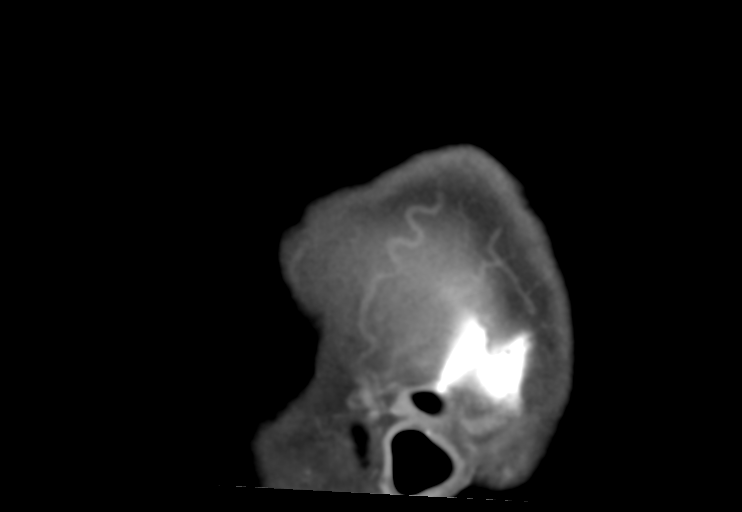
[im 29/58  soft-tissue]
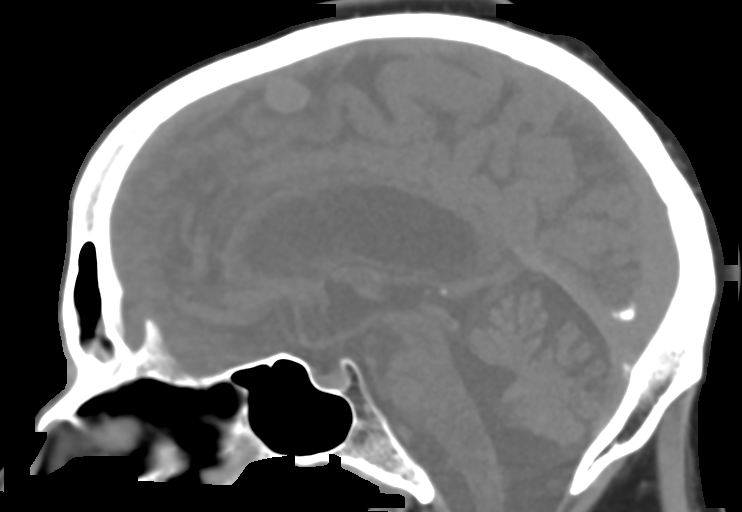
[im 52/58  soft-tissue]
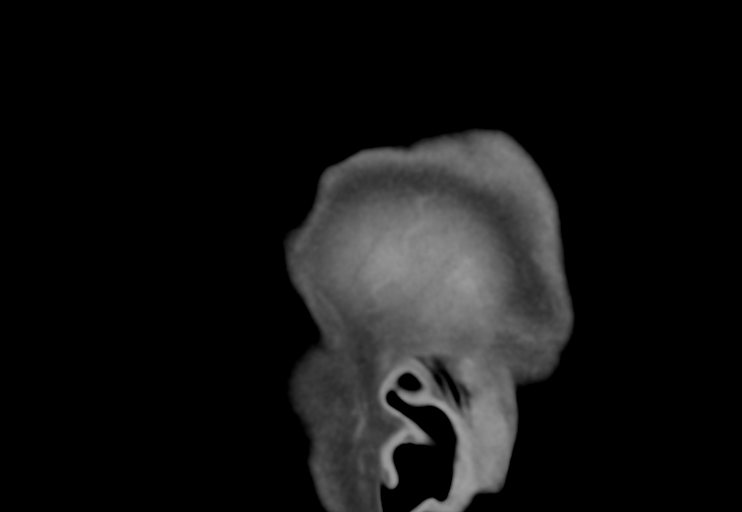

[13 of 35 positions shown; findings below may reference images not displayed]

FINDINGS: CTA NECK FINDINGS

Aortic arch: No hemodynamically significant stenosis of the great
vessel origins. Atherosclerosis.

Right carotid system: Similar atherosclerosis of the common carotid
artery without greater than 50% stenosis. Similar moderate
predominately calcific atherosclerosis at the carotid bifurcation
without greater than 50% stenosis. Similar smooth narrowing of the
mid cervical right internal carotid artery with adjacent thrombosed
focal outpouching with peripheral calcification, suggestive of
dissecting pseudoaneurysm that is likely chronic. Thrombosed
aneurysm is similar in size, measuring approximately 5 x 12 mm.
Associated stenosis is similar, measuring approximately 35% relative
to the distal vessel.

Left carotid system: Moderate predominately calcific atherosclerosis
at the carotid bifurcation without greater than 50% stenosis. No
substantial change in a large dissecting SURINTO aneurysm arising
from the mid cervical ICA which measures up to 14 x 11 x 18 mm. As
before, the pseudoaneurysm is also largely thrombosed with small
area of filling superiorly. Similar intimal irregularity and
narrowing of the associated ICA with approximally 50% stenosis
relative to the distal vessel.

Vertebral arteries: Similar severe stenosis of the right vertebral
artery origin and mild stenosis of the left vertebral artery origin.
Otherwise, vertebral arteries are patent without significant
stenosis.

Skeleton: Similar mild compression deformities of the superior
endplates of T4. The previously seen T5 compression deformity is not
imaged. C3-C4 ACDF.

Other neck: No acute findings.

Upper chest: Incidentally imaged approximately 6 mm pulmonary nodule
in the right upper lobe, previously characterized as benign on [LN]
CT chest. Visualized lung apices are clear. Emphysema.

Review of the MIP images confirms the above findings

CTA HEAD FINDINGS

Anterior circulation: Similar scattered atherosclerosis of bilateral
intracranial ICAs with associated multifocal mild-to-moderate
narrowing. Bilateral ACAs are patent without proximal
hemodynamically significant stenosis. Bilateral MCAs are patent
without proximal hemodynamically significant stenosis.

Posterior circulation: Small vertebrobasilar system with bilateral
fetal type PCAs, anatomic variant and similar to prior. Bilateral
PCAs are patent without proximal hemodynamically significant
stenosis.

Venous sinuses: As permitted by contrast timing, patent.

Anatomic variants: See above.

Other: Incidentally imaged 14 by 8 mm homogeneously enhancing
dural-based mass along the anterior right falx, compatible with
known meningioma that was better characterized on prior MRI.

Review of the MIP images confirms the above findings
IMPRESSION: 1. No change from the CTA from yesterday.
2. No large vessel occlusion or proximal hemodynamically significant
stenosis intracranially.
3. Similar probable dissecting pseudoaneurysms involving the mid
cervical ICAs bilaterally, larger on the left and detailed above.
Similar associated stenosis of the ICAs, approximately 50% on the
left and 35% on the right.
4. Atherosclerosis of the intracranial ICAs with mild-to-moderate
narrowing.
5. Severe stenosis of the right vertebral artery origin.
6. Mild compression deformities of mid thoracic vertebral bodies,
better characterized on CTA from yesterday.
7. Incidentally imaged approximately 6 mm pulmonary nodule in the
right upper lobe, previously characterized as benign on [LN] CT
chest.
8. Aortic Atherosclerosis ([LN]-[LN]) and Emphysema ([LN]-[LN]).

## 2021-05-18 IMAGING — CT CT ANGIO HEAD-NECK (W OR W/O PERF)
2 of 7 series · 8 of 33 positions shown · IV contrast (omnipaque)
Comparison: CTA head/neck from [DATE]. CT chest [DATE].

CLINICAL DATA: Neuro deficit, acute, stroke suspected Unstable
plaque and recent focal status, SURINTO paralysis vs. L MCA occlusion

EXAM:
CT ANGIOGRAPHY HEAD AND NECK
TECHNIQUE: Multidetector CT imaging of the head and neck was performed using
the standard protocol during bolus administration of intravenous
contrast. Multiplanar CT image reconstructions and MIPs were
obtained to evaluate the vascular anatomy. Carotid stenosis
measurements (when applicable) are obtained utilizing NASCET
criteria, using the distal internal carotid diameter as the
denominator.
CONTRAST:  75mL OMNIPAQUE IOHEXOL 350 MG/ML SOLN

[Series 6: cta neck/head · axial · 0.52mm/px · z∈[-245,-125]mm · 2 of 165 slices shown]
[im 55/165  soft-tissue]
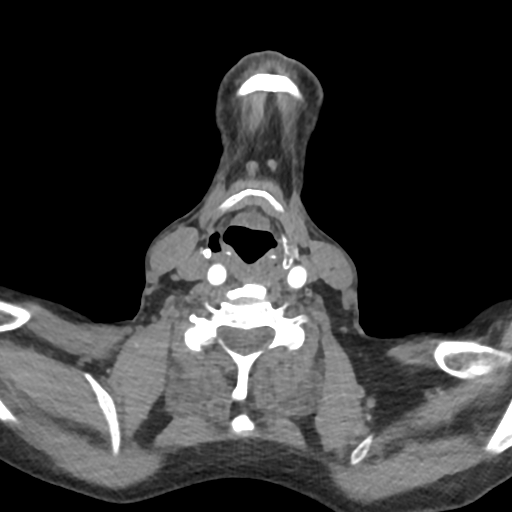
[im 110/165  soft-tissue]
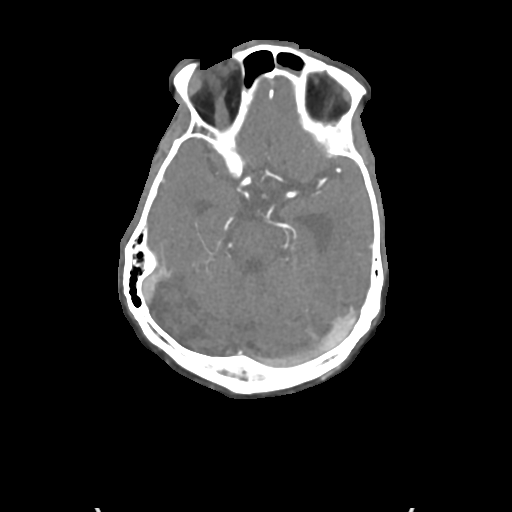

[Series 8: ax thins · axial · 0.36mm/px · z∈[-304,-66]mm · 6 of 334 slices shown]
[im 48/334  soft-tissue]
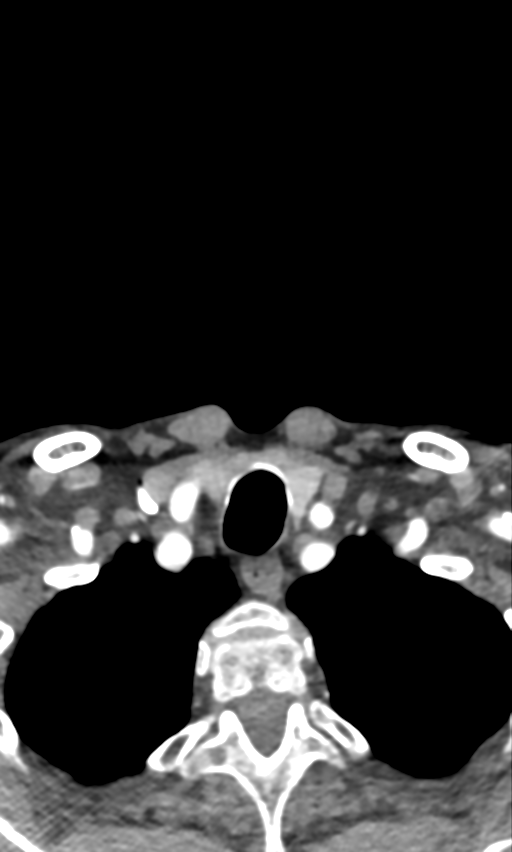
[im 96/334  bone]
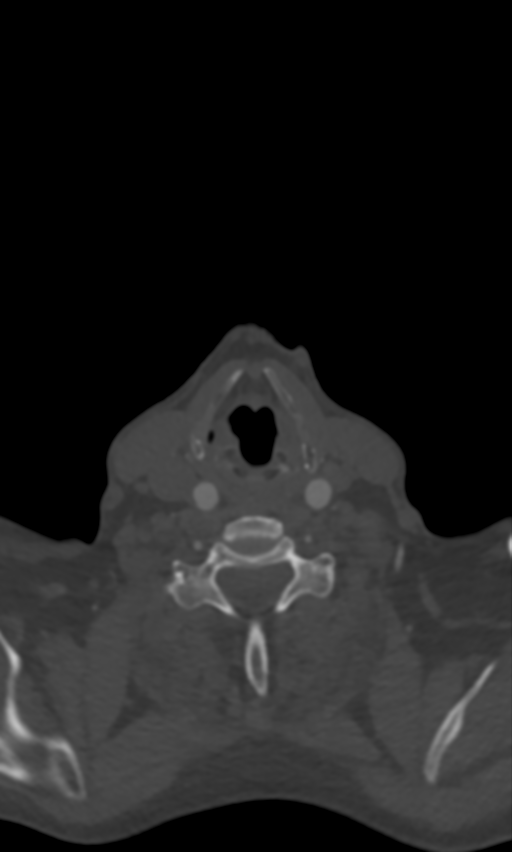
[im 143/334  soft-tissue]
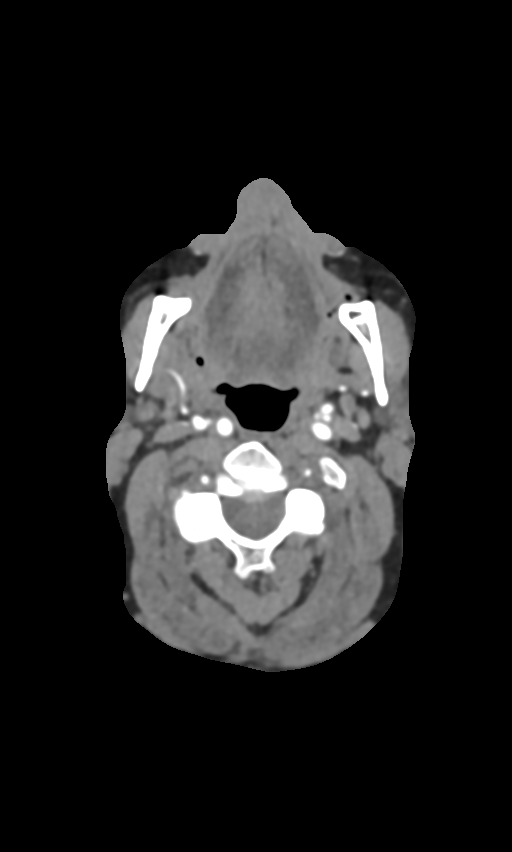
[im 191/334  bone]
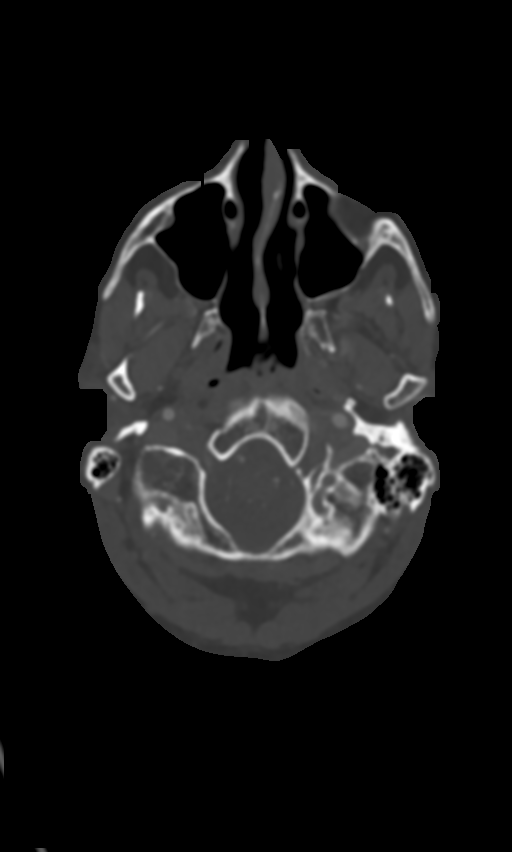
[im 238/334  soft-tissue]
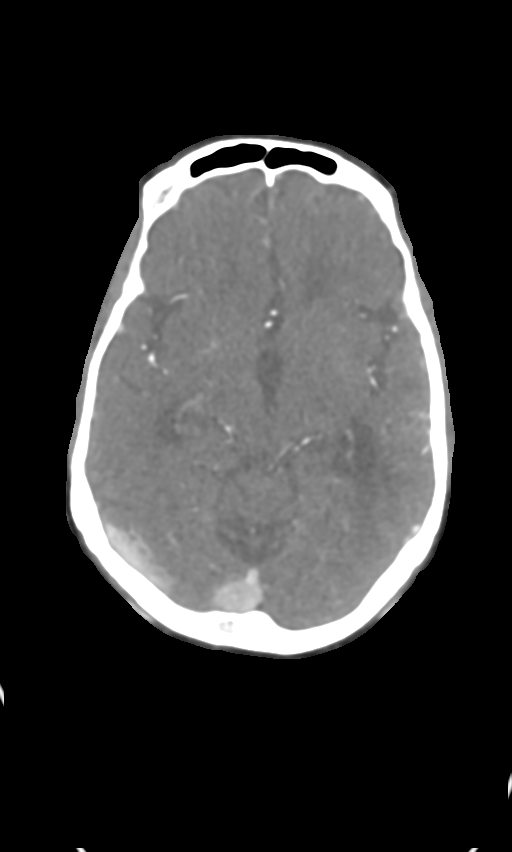
[im 286/334  bone]
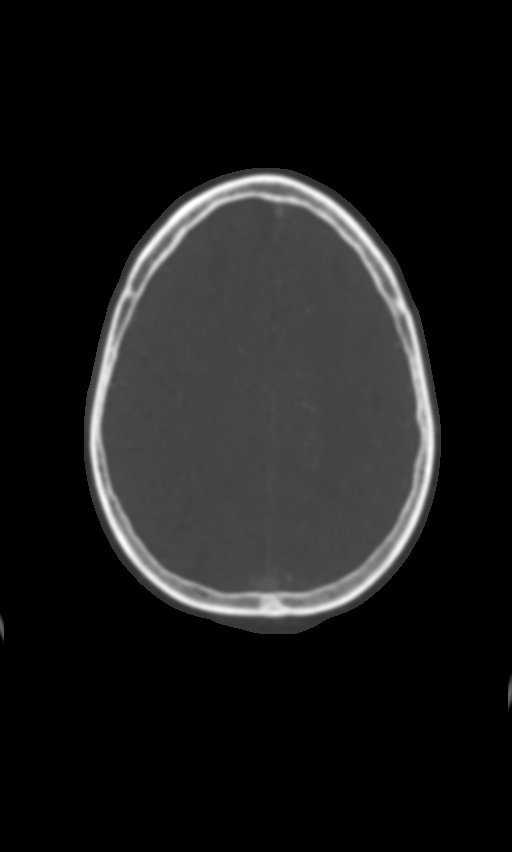

[8 of 33 positions shown; findings below may reference images not displayed]

FINDINGS: CTA NECK FINDINGS

Aortic arch: No hemodynamically significant stenosis of the great
vessel origins. Atherosclerosis.

Right carotid system: Similar atherosclerosis of the common carotid
artery without greater than 50% stenosis. Similar moderate
predominately calcific atherosclerosis at the carotid bifurcation
without greater than 50% stenosis. Similar smooth narrowing of the
mid cervical right internal carotid artery with adjacent thrombosed
focal outpouching with peripheral calcification, suggestive of
dissecting pseudoaneurysm that is likely chronic. Thrombosed
aneurysm is similar in size, measuring approximately 5 x 12 mm.
Associated stenosis is similar, measuring approximately 35% relative
to the distal vessel.

Left carotid system: Moderate predominately calcific atherosclerosis
at the carotid bifurcation without greater than 50% stenosis. No
substantial change in a large dissecting SURINTO aneurysm arising
from the mid cervical ICA which measures up to 14 x 11 x 18 mm. As
before, the pseudoaneurysm is also largely thrombosed with small
area of filling superiorly. Similar intimal irregularity and
narrowing of the associated ICA with approximally 50% stenosis
relative to the distal vessel.

Vertebral arteries: Similar severe stenosis of the right vertebral
artery origin and mild stenosis of the left vertebral artery origin.
Otherwise, vertebral arteries are patent without significant
stenosis.

Skeleton: Similar mild compression deformities of the superior
endplates of T4. The previously seen T5 compression deformity is not
imaged. C3-C4 ACDF.

Other neck: No acute findings.

Upper chest: Incidentally imaged approximately 6 mm pulmonary nodule
in the right upper lobe, previously characterized as benign on [LN]
CT chest. Visualized lung apices are clear. Emphysema.

Review of the MIP images confirms the above findings

CTA HEAD FINDINGS

Anterior circulation: Similar scattered atherosclerosis of bilateral
intracranial ICAs with associated multifocal mild-to-moderate
narrowing. Bilateral ACAs are patent without proximal
hemodynamically significant stenosis. Bilateral MCAs are patent
without proximal hemodynamically significant stenosis.

Posterior circulation: Small vertebrobasilar system with bilateral
fetal type PCAs, anatomic variant and similar to prior. Bilateral
PCAs are patent without proximal hemodynamically significant
stenosis.

Venous sinuses: As permitted by contrast timing, patent.

Anatomic variants: See above.

Other: Incidentally imaged 14 by 8 mm homogeneously enhancing
dural-based mass along the anterior right falx, compatible with
known meningioma that was better characterized on prior MRI.

Review of the MIP images confirms the above findings
IMPRESSION: 1. No change from the CTA from yesterday.
2. No large vessel occlusion or proximal hemodynamically significant
stenosis intracranially.
3. Similar probable dissecting pseudoaneurysms involving the mid
cervical ICAs bilaterally, larger on the left and detailed above.
Similar associated stenosis of the ICAs, approximately 50% on the
left and 35% on the right.
4. Atherosclerosis of the intracranial ICAs with mild-to-moderate
narrowing.
5. Severe stenosis of the right vertebral artery origin.
6. Mild compression deformities of mid thoracic vertebral bodies,
better characterized on CTA from yesterday.
7. Incidentally imaged approximately 6 mm pulmonary nodule in the
right upper lobe, previously characterized as benign on [LN] CT
chest.
8. Aortic Atherosclerosis ([LN]-[LN]) and Emphysema ([LN]-[LN]).

## 2021-05-18 MED ORDER — SODIUM CHLORIDE 0.9 % IV SOLN
20.0000 mg/kg | Freq: Once | INTRAVENOUS | Status: AC
  Administered 2021-05-18: 1319.5 mg via INTRAVENOUS
  Filled 2021-05-18: qty 10.15

## 2021-05-18 MED ORDER — CLONAZEPAM 0.5 MG PO TABS: 2.0000 mg | ORAL_TABLET | Freq: Two times a day (BID) | ORAL | Status: DC

## 2021-05-18 MED ORDER — PANTOPRAZOLE SODIUM 40 MG IV SOLR
40.0000 mg | Freq: Every day | INTRAVENOUS | Status: DC
  Administered 2021-05-18 – 2021-05-19 (×2): 40 mg via INTRAVENOUS
  Filled 2021-05-18 (×2): qty 40

## 2021-05-18 MED ORDER — CLOPIDOGREL BISULFATE 75 MG PO TABS
75.0000 mg | ORAL_TABLET | Freq: Every day | ORAL | Status: DC
  Administered 2021-05-19 – 2021-05-21 (×3): 75 mg via ORAL
  Filled 2021-05-18 (×3): qty 1

## 2021-05-18 MED ORDER — PHENOBARBITAL SODIUM 65 MG/ML IJ SOLN
65.0000 mg | Freq: Two times a day (BID) | INTRAMUSCULAR | Status: DC
  Administered 2021-05-19: 65 mg via INTRAVENOUS
  Filled 2021-05-18 (×2): qty 1

## 2021-05-18 MED ORDER — MIDAZOLAM HCL 2 MG/2ML IJ SOLN
2.0000 mg | INTRAMUSCULAR | Status: DC | PRN
Start: 2021-05-18 — End: 2021-05-22
  Administered 2021-05-18: 2 mg via INTRAVENOUS
  Filled 2021-05-18: qty 2

## 2021-05-18 MED ORDER — IOHEXOL 350 MG/ML SOLN
75.0000 mL | Freq: Once | INTRAVENOUS | Status: AC | PRN
  Administered 2021-05-18: 75 mL via INTRAVENOUS

## 2021-05-18 MED ORDER — LEVETIRACETAM IN NACL 1000 MG/100ML IV SOLN
1000.0000 mg | Freq: Two times a day (BID) | INTRAVENOUS | Status: DC
  Administered 2021-05-18 – 2021-05-22 (×9): 1000 mg via INTRAVENOUS
  Filled 2021-05-18 (×9): qty 100

## 2021-05-18 MED ORDER — CYANOCOBALAMIN 1000 MCG/ML IJ SOLN
1000.0000 ug | Freq: Every day | INTRAMUSCULAR | Status: DC
  Administered 2021-05-18 – 2021-05-24 (×7): 1000 ug via SUBCUTANEOUS
  Filled 2021-05-18 (×7): qty 1

## 2021-05-18 MED ORDER — ROSUVASTATIN CALCIUM 20 MG PO TABS
20.0000 mg | ORAL_TABLET | Freq: Every day | ORAL | Status: DC
  Administered 2021-05-19: 20 mg via ORAL
  Filled 2021-05-18: qty 1

## 2021-05-18 MED ORDER — MIDAZOLAM HCL 2 MG/2ML IJ SOLN
2.0000 mg | INTRAMUSCULAR | Status: AC | PRN
  Administered 2021-05-18 (×2): 2 mg via INTRAVENOUS
  Filled 2021-05-18 (×2): qty 2

## 2021-05-18 MED ORDER — SODIUM CHLORIDE 0.9 % IV SOLN
100.0000 mg | Freq: Two times a day (BID) | INTRAVENOUS | Status: AC
  Administered 2021-05-18 – 2021-05-22 (×8): 100 mg via INTRAVENOUS
  Filled 2021-05-18 (×9): qty 10

## 2021-05-18 MED ORDER — SODIUM CHLORIDE 0.9 % IV SOLN
200.0000 mg | Freq: Once | INTRAVENOUS | Status: AC
  Administered 2021-05-18: 200 mg via INTRAVENOUS
  Filled 2021-05-18: qty 20

## 2021-05-18 MED ORDER — SODIUM CHLORIDE 0.9 % IV SOLN
1.0000 mg | Freq: Every day | INTRAVENOUS | Status: DC
  Administered 2021-05-18: 1 mg via INTRAVENOUS
  Filled 2021-05-18 (×3): qty 0.2

## 2021-05-18 MED ORDER — CLOPIDOGREL BISULFATE 75 MG PO TABS
300.0000 mg | ORAL_TABLET | Freq: Once | ORAL | Status: DC
  Filled 2021-05-18: qty 1

## 2021-05-18 MED ORDER — MAGNESIUM SULFATE 2 GM/50ML IV SOLN
2.0000 g | Freq: Once | INTRAVENOUS | Status: AC
  Administered 2021-05-18: 2 g via INTRAVENOUS
  Filled 2021-05-18: qty 50

## 2021-05-18 NOTE — ED Notes (Signed)
Floor RN stated room is waiting to be zapped and not ready at the moment.

## 2021-05-18 NOTE — Progress Notes (Signed)
SLP Cancellation Note  Patient Details Name: Seth [name] MRN: [id number] DOB: 1955/09/23   Cancelled treatment:       Reason Eval/Treat Not Completed: Patient's level of consciousness   Seth [name], Katherene Ponto 05/18/2021, 10:53 AM

## 2021-05-18 NOTE — ED Notes (Signed)
EEG at bedside.

## 2021-05-18 NOTE — Progress Notes (Signed)
Assinged study to DR Hortense Ramal.

## 2021-05-18 NOTE — Progress Notes (Signed)
EEG complete - results pending 

## 2021-05-18 NOTE — Progress Notes (Addendum)
Neurology Progress Note  Patient ID: Seth [name] is a 65 y.o. with PMHx of  has a past medical history of Depression, Enlarged prostate, Gout, Hypertension, Metabolic acidosis (99/83/3825), and Stroke [facility]).  Initially consulted for: Status epilepticus  Subjective: - Reports he is doing well, unaware of his rhythmic left sided twitching  Exam: Vitals:   05/18/21 0000 05/18/21 0200  BP: (!) 169/93 (!) 160/95  Pulse:  96  Resp: (!) 24 (!) 23  Temp:    SpO2:  100%   Gen: In bed, comfortable  Resp: non-labored breathing, no grossly audible wheezing Cardiac: Perfusing extremities well  Abd: soft, nt  Neuro: MS: Awake, alert, able to follow commands.  Knows he is in the hospital.  On reevaluation is able to state his twitching is seizure.  Reports the year when asked the month.  Oriented to month by choices.  Reports it is Tuesday (early Wednesday morning 4:44 AM) CN: Left gaze preference, mild left head turn, pupils equal round reactive to light, partial right hemianopia but able to orient towards objects on the right eventually.  Able to bury eyes towards the right with significant encouragement.  Right facial droop.  Tongue midline. Motor: Continuous twitching of the right lower extremity, intermittent twitching of the right arm (both are rhythmic and nonsuppressible), as well as the torso and occasionally some twitching movements of the head towards the left  Pertinent Data:   Basic Metabolic Panel: Recent Labs  Lab 05/16/21 2015 05/16/21 2019 05/17/21 0528  NA 136 134* 136  K 4.9 4.5 4.6  CL 102 104 102  CO2 17*  --  21*  GLUCOSE 92 96 101*  BUN 10 10 9   CREATININE 1.40* 1.30* 1.26*  CALCIUM 9.5  --  8.8*  MG  --   --  2.1  PHOS  --   --  3.2    CBC: Recent Labs  Lab 05/16/21 2015 05/16/21 2019 05/17/21 0528  WBC 12.2*  --  7.8  NEUTROABS 9.8*  --   --   HGB 13.2 14.6 10.7*  HCT 41.2 43.0 33.9*  MCV 89.2  --  91.4  PLT 173  --  150    Coagulation  Studies: Recent Labs    05/16/21 2028  LABPROT 14.6  INR 1.1     Lab Results  Component Value Date   HGBA1C 4.4 (L) 03/14/2021   No results found for: CHOL, HDL, LDLCALC, LDLDIRECT, TRIG, CHOLHDL  Lab Results  Component Value Date   CKTOTAL 5,214 (H) 05/17/2021   Repeat pending   MRI brain personally reviewed, agree with radiology:  1. Small acute infarct in the left frontal lobe inferolaterally. 2. Moderate global parenchymal volume loss with enlargement of the ventricular system, advanced for age. 3. 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  CTA head and neck personally reviewed, agree with radiology: 1. Negative CTA for large vessel occlusion. 2. 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 Associated stenoses measures up to approximately 50% on the left and 35% on the right. The right pseudoaneurysm is largely chronic in appearance, while the left demonstrates associated intimal irregularity, suggesting intraluminal thrombus related to an acute component. 3. Atheromatous change throughout the carotid siphons with associated mild to moderate multifocal narrowing. 4. Fetal type origin of the PCAs with overall diminutive vertebrobasilar system. Severe ostial stenosis at the origin of the right vertebral artery. 5. Mild compression deformities involving the superior endplates of T4 and T5, age  indeterminate, but new as compared to prior chest CT from 02/19/2019. Correlation with physical exam recommended. 6. 8 mm right upper lobe nodule, indeterminate. Non-contrast chest CT at 6-12 months is recommended. If the nodule is stable at time of repeat CT, then future CT at 18-24 months (from today's scan) is considered optional for low-risk patients, but is recommended for high-risk patients. This recommendation follows the consensus statement: Guidelines for Management of Incidental Pulmonary Nodules Detected on CT Images: From  the Fleischner Society 2017; Radiology 2017; 284:228-243. 7. Aortic Atherosclerosis (ICD10-I70.0) and Emphysema (ICD10-J43.9).  Impression:  Regarding his focal seizures, at this time low clinical concern for withdrawal given the patient is calm, cooperative, oriented, not tremulous, not diaphoretic, no tongue fasciculations etc. 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  No clinical data to guide anticoagulation versus dual antiplatelet therapy in the acute setting, though in the chronic setting anticoagulation is associated with increased bleeding risk while dual antiplatelet therapy is noninferior in preventing further strokes.  Given the patient's significant issues with medication compliance and the fact that missed doses of anticoagulation such as Eliquis can be associated with increased thromboembolic risk, as well as his recent traumatic subdural hemorrhage, I believe dual antiplatelet therapy will be safer than anticoagulation, and will initiate this treatment.  As he is clinically improving at this time, I do not feel there is an indication for emergent procedure, additionally stent placement in the setting of medication nonadherence can do more harm than benefit.  Plan to discuss with neuro interventional radiology in the morning for completeness.  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 access, will start Vimpat 200 mg loading dose followed by 100 mg twice daily - Keppra 1000 mg twice daily is maximized for his current renal function, continue - cEEG in the morning for better assessment of seizure frequency/control (non-emergent) Estimated Creatinine Clearance: 56 mL/min (A) (by C-G formula based on SCr of 1.26 mg/dL (H)).   CrCl 80 to 130 mL/minute/1.73 m2: 500 mg to 1.5 g every 12 hours.  CrCl 50 to <80 mL/minute/1.73 m2: 500 mg to 1 g every 12 hours.  CrCl 30 to <50 mL/minute/1.73 m2: 250 to 750 mg every 12 hours.  CrCl 15 to <30 mL/minute/1.73 m2: 250 to 500 mg every 12 hours.  CrCl <15 mL/minute/1.73 m2: 250 to 500 mg every 24 hours (expert opinion). -Neurology will continue to follow  Discussed MRI brain findings, CTA head and neck findings, my impression and recommendations regarding antiplatelet agents as detailed above at length (10 minutes) with daughter Idalia Needle.  She confirmed that she is the patient's healthcare decision maker but also requested that her aunt, Ms. Marquay Kruse be directly updated when time allows.  At the time of my discussion, I had not yet seen the patient and discovered he was having additional seizures, therefore this information will need to be relayed to  the family at a future time  Addendum: At approximately 6 AM, Noted no further twitching activity at the time, but patient was having severe right-sided neglect and worsening dysarthria/aphasia.  Given concern for potential unstable ulcerated plaque in the left carotid artery versus postictal Todd's paralysis, discussed with Dr. Karenann Cai and obtained emergent CTA and repeat head CT which fortunately were stable from prior on my personal review, formal radiology read pending.  Discussed patient with Dr. Demetra Shiner, my partner who will be taking over this patient's care for the day.  As patient again began to have focal motor  seizure activity after CT scan, decision was made to load with phenobarbital. Further care per Dr. Malen Gauze today.  Dr. Sloan Leiter, hospitalist notified of patient's change in status via secure chat.   Lesleigh Noe MD-PhD [facility] [phone]   CRITICAL CARE Performed by: Lorenza Chick   Total critical care time: 90  minutes  Critical care time was exclusive of separately billable procedures and treating other patients.  Critical care was necessary to treat or prevent imminent or life-threatening deterioration, emergent treatment of focal status epilepticus to prevent secondary generalization requiring frequent reevaluation of the patient and medication titration, monitoring of vitals and stat labs  Critical care was time spent personally by me on the following activities: development of treatment plan with patient and/or surrogate as well as nursing, discussions with consultants, evaluation of patient's response to treatment, examination of patient, obtaining history from patient or surrogate, ordering and performing treatments and interventions, ordering and review of laboratory studies, ordering and review of radiographic studies, pulse oximetry and re-evaluation of patient's condition.

## 2021-05-18 NOTE — Progress Notes (Signed)
Started cEEG study.  Tested patient event button.  Educated staff as to event button and how to handle EEG equipment if patient is relocated.

## 2021-05-18 NOTE — Procedures (Signed)
Routine EEG Repot  Seth [name] is a 65 y.o. male with a history of seizures who is undergoing an EEG to evaluate for seizures.  Report: This EEG was acquired with electrodes placed according to the International 10-20 electrode system (including Fp1, Fp2, F3, F4, C3, C4, P3, P4, O1, O2, T3, T4, T5, T6, A1, A2, Fz, Cz, Pz). The following electrodes were missing or displaced: none.  There was no clear occipital dominant rhythm. The best background was 6-8 Hz with overlying beta frequencies. This activity is reactive to stimulation. Drowsiness was manifested by background fragmentation; deeper stages of sleep were not identified. There was no focal slowing. There were intermittent sharp waves over the left hemisphere that at times briefly become rhythmic up to 1 Hz. There were no electrographic seizures identified. Photic stimulation and hyperventilation were not performed.   Impression: This EEG was obtained while sedated on midazolam and phenobarbital and drowsy and is abnormal due to: - Mild diffuse slowing indicating global cerebral dysfunction - Sharp waves over the left hemisphere indicating increased epileptogenicity in this region  Beta frequencies are secondary to medication effect from benzodiazepines.  There were no electrographic seizures seen in this recording.  Su Monks, MD [facility] [phone]  If 7pm- 7am, please page neurology on call as listed in [place].

## 2021-05-18 NOTE — Telephone Encounter (Signed)
Patient currently in the ER. Will close this encounter.

## 2021-05-18 NOTE — ED Notes (Signed)
Pt given phenobarbital as prescribed. Pts RLE twitch ceased at this time. Rory Percy, MD aware. Pt lethargic, airway intact. Resp even/unlabored. SpO2 97% and above.

## 2021-05-18 NOTE — ED Notes (Signed)
Placed Breakfast order 

## 2021-05-18 NOTE — ED Notes (Signed)
Called daughter and gave update on care

## 2021-05-18 NOTE — Progress Notes (Signed)
EEG maintenance performed. No skin break down noted. Patients button was tested.

## 2021-05-18 NOTE — ED Notes (Signed)
Eeg tech at the bedside.

## 2021-05-18 NOTE — Progress Notes (Signed)
Same day progress note EEG with left-sided LPD's Continue Keppra, Lamictal, Phenobarb Exam with increased drowsiness but no other change since this morning. Continue LTM Will follow  -- Amie [place], MD Neurologist [facility] Pager: [phone]

## 2021-05-18 NOTE — Progress Notes (Addendum)
PROGRESS NOTE        PATIENT DETAILS Name: Seth [name] Age: 65 y.o. Sex: male Date of Birth: 05/19/1956 Admit Date: 05/16/2021 Admitting Physician Evalee Mutton Kristeen Mans, MD TDS:KAJGO, Myra Rude, MD  Brief Narrative: Patient is a 65 y.o. male with history of seizure disorder, SDH (5/22-likely traumatic) EtOH use, nonadherent to medications, s/p left foot osteomyelitis-s/p TMA-presented with confusion and right-sided weakness-thought to have breakthrough seizures and Todd's paresis.  See below for further details.  Subjective: Still with right-sided weakness-less left gaze preference compared to yesterday.  Slurred speech.  Accumulating some secretions but coughing and easily maintaining airway.  Continues to have twitching of right lower extremity when I saw him as well.  Objective: Vitals: Blood pressure (!) 151/100, pulse (!) 118, temperature 99.8 F (37.7 C), temperature source Rectal, resp. rate (!) 43, height 6\' 3"  (1.905 m), weight 66 kg, SpO2 100 %.   Exam: Gen Exam:Alert awake-not in any distress HEENT:atraumatic, normocephalic Chest: B/L clear to auscultation anteriorly CVS:S1S2 regular Abdomen:soft non tender, non distended Extremities:no edema Neurology: Continues to have right-sided weakness-twitching of mostly right lower extremity seen-at times twitching of right upper extremity as well. Skin: no rash   Pertinent Labs/Radiology: Na:136 K: 3.6 Creatinine:1.02 Mg: 1.6 CK: 3559  9/19>> COVID PCR: Negative 9/19>> UA: No significant amount of WBC. 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 9/21>> LDL:39  Assessment/Plan: Breakthrough focal seizures: Developed twitching of RUE/RLE overnight-concern  for focal status epilepticus-LTM EEG planned.  Continue Keppra-neurology loading patient with phenobarbital.  Await further input from neurology.  Keep n.p.o. until evaluated by speech therapy-some accumulation of secretions-but easily able to clear airway.  Acute CVA: On dual antiplatelets/statin-work-up as above-neurology following.  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  Will await further recommendations from neurology.  Right hemiplegia/left gaze preference: Right-sided weakness remains unchanged-less left gaze preference compared to yesterday.  Etiology felt to be either from Todd's paresis in the setting of seizures or from small acute CVA.  Work-up in progress-awaiting PT/OT eval.  .  EtOH use: Continue CIWA protocol-thiamine high-dose x3 days per neurology.  Hypomagnesemia: Replete and recheck.  Fever: Afebrile this morning-suspect fever due to seizures.  No obvious source of infection apparent.  Monitor off antibiotics-await culture data.  Rhabdomyolysis: Likely due to seizures-improving with downtrending CK levels.  EtOH use: Poor historian-unclear when his last drink was-no signs of alcohol withdrawal at this point-continue Ativan per CIWA protocol.  Follow.  HTN: Allow permissive hypertension given acute CVA.  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  8 mm right upper lobe lung nodule: Radiology recommending repeat CT chest in 6-12 months.  Procedures: None Consults: None DVT Prophylaxis: Lovenox Code Status:Full code  Family Communication:  Daughter-[name]-[phone]-updated over the phone on 9/21  Time spent: 35 minutes-Greater than 50% of this time was spent in counseling, explanation of diagnosis, planning of further  management, and coordination of care.  Diet: Diet Order             Diet Heart Room service appropriate? Yes; Fluid consistency: Thin  Diet effective now                      Disposition Plan: Status is: Inpatient  The patient will require care spanning > 2 midnights and should be moved to inpatient because: Inpatient level of care appropriate due to severity of illness  Dispo: The patient is from: Home              Anticipated d/c is to: Home              Patient currently is not medically stable to d/c.   Difficult to place patient No    Barriers to Discharge: Concern for status epilepticus-LTM EEG in progress.  Not stable for discharge.  Antimicrobial agents: Anti-infectives (From admission, onward)    None        MEDICATIONS: Scheduled Meds:  aspirin  81 mg Oral Daily   clopidogrel  300 mg Oral Once   Followed by   [START ON 05/19/2021] clopidogrel  75 mg Oral Daily   cyanocobalamin  1,000 mcg Subcutaneous Daily   enoxaparin (LOVENOX) injection  40 mg Subcutaneous QHS   fluticasone furoate-vilanterol  1 puff Inhalation Daily   multivitamin with minerals  1 tablet Oral Daily   pantoprazole (PROTONIX) IV  40 mg Intravenous Daily   PHENObarbital  65 mg Intravenous BID   rosuvastatin  10 mg Oral Daily   umeclidinium bromide  1 puff Inhalation Daily   Continuous Infusions:  folic acid (FOLVITE) IVPB     lacosamide (VIMPAT) IV     lactated ringers 75 mL/hr at 05/17/21 1304   levETIRAcetam Stopped (05/18/21 0938)   magnesium sulfate bolus IVPB 2 g (05/18/21 0917)   thiamine injection Stopped (05/18/21 0919)   PRN Meds:.acetaminophen **OR** acetaminophen, LORazepam **OR** LORazepam, midazolam, midazolam, ondansetron **OR** ondansetron (ZOFRAN) IV   I have personally  reviewed following labs and imaging studies  LABORATORY DATA: CBC: Recent Labs  Lab 05/16/21 2015 05/16/21 2019 05/17/21 0528  WBC 12.2*  --  7.8  NEUTROABS 9.8*  --   --   HGB 13.2 14.6 10.7*  HCT 41.2 43.0 33.9*  MCV 89.2  --  91.4  PLT 173  --  150     Basic Metabolic Panel: Recent Labs  Lab 05/16/21 2015 05/16/21 2019 05/17/21 0528 05/18/21 0607  NA 136 134* 136 136  K 4.9 4.5 4.6 3.6  CL 102 104 102 104  CO2 17*  --  21* 20*  GLUCOSE 92 96 101* 82  BUN 10 10 9  5*  CREATININE 1.40* 1.30* 1.26* 1.02  CALCIUM 9.5  --  8.8* 8.7*  MG  --   --  2.1 1.6*  PHOS  --   --  3.2 3.3     GFR: Estimated Creatinine Clearance: 69.2 mL/min (by C-G formula based on SCr of 1.02 mg/dL).  Liver Function Tests: Recent Labs  Lab 05/16/21 2015 05/18/21 0607  AST 67* 63*  ALT 18 18  ALKPHOS 105 70  BILITOT 1.1 1.3*  PROT 7.5 5.6*  ALBUMIN 4.0 2.8*    No results for input(s): LIPASE, AMYLASE in the last 168 hours. No results for input(s): AMMONIA in the last 168 hours.  Coagulation Profile: Recent Labs  Lab 05/16/21 2028  INR 1.1     Cardiac  Enzymes: Recent Labs  Lab 05/17/21 0528 05/18/21 0607  CKTOTAL 5,214* 3,559*     BNP (last 3 results) No results for input(s): PROBNP in the last 8760 hours.  Lipid Profile: Recent Labs    05/18/21 0607  CHOL 152  HDL 97  LDLCALC 39  TRIG 81  CHOLHDL 1.6    Thyroid Function Tests: No results for input(s): TSH, T4TOTAL, FREET4, T3FREE, THYROIDAB in the last 72 hours.  Anemia Panel: No results for input(s): VITAMINB12, FOLATE, FERRITIN, TIBC, IRON, RETICCTPCT in the last 72 hours.  Urine analysis:    Component Value Date/Time   COLORURINE YELLOW 05/18/2021 0609   APPEARANCEUR CLEAR 05/18/2021 0609   LABSPEC 1.025 05/18/2021 0609   PHURINE 6.0 05/18/2021 0609   GLUCOSEU NEGATIVE 05/18/2021 0609   HGBUR NEGATIVE 05/18/2021 0609   BILIRUBINUR NEGATIVE 05/18/2021 0609   KETONESUR NEGATIVE 05/18/2021  0609   PROTEINUR NEGATIVE 05/18/2021 0609   NITRITE NEGATIVE 05/18/2021 0609   LEUKOCYTESUR NEGATIVE 05/18/2021 0609    Sepsis Labs: Lactic Acid, Venous    Component Value Date/Time   LATICACIDVEN 1.2 01/05/2021 0010    MICROBIOLOGY: Recent Results (from the past 240 hour(s))  Resp Panel by RT-PCR (Flu A&B, Covid) Nasopharyngeal Swab     Status: None   Collection Time: 05/16/21  9:20 PM   Specimen: Nasopharyngeal Swab; Nasopharyngeal(NP) swabs in vial transport medium  Result Value Ref Range Status   SARS Coronavirus 2 by RT PCR NEGATIVE NEGATIVE Final    Comment: (NOTE) SARS-CoV-2 target nucleic acids are NOT DETECTED.  The SARS-CoV-2 RNA is generally detectable in upper respiratory specimens during the acute phase of infection. The lowest concentration of SARS-CoV-2 viral copies this assay can detect is 138 copies/mL. A negative result does not preclude SARS-Cov-2 infection and should not be used as the sole basis for treatment or other patient management decisions. A negative result may occur with  improper specimen collection/handling, submission of specimen other than nasopharyngeal swab, presence of viral mutation(s) within the areas targeted by this assay, and inadequate number of viral copies(<138 copies/mL). A negative result must be combined with clinical observations, patient history, and epidemiological information. The expected result is Negative.  Fact Sheet for Patients:  EntrepreneurPulse.com.au  Fact Sheet for Healthcare Providers:  IncredibleEmployment.be  This test is no t yet approved or cleared by the [place] FDA and  has been authorized for detection and/or diagnosis of SARS-CoV-2 by FDA under an Emergency Use Authorization (EUA). This EUA will remain  in effect (meaning this test can be used) for the duration of the COVID-19 declaration under Section 564(b)(1) of the Act, 21 U.S.C.section 360bbb-3(b)(1),  unless the authorization is terminated  or revoked sooner.       Influenza A by PCR NEGATIVE NEGATIVE Final   Influenza B by PCR NEGATIVE NEGATIVE Final    Comment: (NOTE) The Xpert Xpress SARS-CoV-2/FLU/RSV plus assay is intended as an aid in the diagnosis of influenza from Nasopharyngeal swab specimens and should not be used as a sole basis for treatment. Nasal washings and aspirates are unacceptable for Xpert Xpress SARS-CoV-2/FLU/RSV testing.  Fact Sheet for Patients: EntrepreneurPulse.com.au  Fact Sheet for Healthcare Providers: IncredibleEmployment.be  This test is not yet approved or cleared by the [place] FDA and has been authorized for detection and/or diagnosis of SARS-CoV-2 by FDA under an Emergency Use Authorization (EUA). This EUA will remain in effect (meaning this test can be used) for the duration of the COVID-19 declaration under Section 564(b)(1) of the  Act, 21 U.S.C. section 360bbb-3(b)(1), unless the authorization is terminated or revoked.  Performed at [facility], [place] [address], [place], [place] [address]     RADIOLOGY STUDIES/RESULTS: CT ANGIO HEAD NECK W WO CM  Result Date: 05/18/2021 CLINICAL DATA:  Follow-up examination for acute stroke. EXAM: CT ANGIOGRAPHY HEAD AND NECK TECHNIQUE: Multidetector CT imaging of the head and neck was performed using the standard protocol during bolus administration of intravenous contrast. Multiplanar CT image reconstructions and MIPs were obtained to evaluate the vascular anatomy. Carotid stenosis measurements (when applicable) are obtained utilizing NASCET criteria, using the distal internal carotid diameter as the denominator. CONTRAST:  115mL OMNIPAQUE IOHEXOL 350 MG/ML SOLN COMPARISON:  Prior MRI from earlier the same day as well as previous CT from 05/16/2021. FINDINGS: CT HEAD FINDINGS Brain: Moderately advanced age-related cerebral atrophy. Patchy hypodensity  involving the periventricular and deep white matter both cerebral hemispheres most consistent with chronic small vessel ischemic disease. Multiple scattered remote bilateral cerebellar infarcts. Previously identified small left frontal infarct not visible by CT. No other acute large vessel territory infarct. No acute intracranial hemorrhage. 1.4 cm right parafalcine meningioma without associated mass effect, stable. No other visible mass lesion or mass effect. Diffuse ventriculomegaly, stable from prior. No extra-axial fluid collection. Vascular: No hyperdense vessel. Calcified atherosclerosis present at skull base. Skull: Scalp soft tissues and calvarium within normal limits. Sinuses: Paranasal sinuses and mastoid air cells are clear. Orbits: Globes and orbital soft tissues demonstrate no acute finding. Review of the MIP images confirms the above findings CTA NECK FINDINGS Aortic arch: Visualized aortic arch normal in caliber with normal 3 vessel morphology. Mild-to-moderate atheromatous change within the arch itself. No hemodynamically significant stenosis seen about the origin of the great vessels. Right carotid system: Scattered nonstenotic soft plaque seen within the right CCA. Bulky calcified plaque about the right carotid bulb/proximal right ICA without hemodynamically significant stenosis. Distally, there is mild smooth narrowing of the mid cervical right ICA. Adjacent thrombosed focal outpouching with peripheral calcification (series 14, image 411). 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 No acute appearing intraluminal thrombus or frank raised dissection flap. Thrombosed aneurysm measures approximately 5 x 6 x 12 mm in size. Associated stenosis is mild measuring no more than 35%. Distally, right ICA widely patent to the skull base. Left carotid system: Scattered nonstenotic plaque noted within the left CCA. Bulky calcified plaque about the left carotid  bulb/proximal left ICA without hemodynamically significant stenosis. There is a similar but more prominent dissecting pseudoaneurysm involving the contralateral mid cervical left ICA (series 14, image 400). This pseudoaneurysm is also largely thrombosed, although a small area of persistent filling is seen at its superior aspect (series 14, image 419). Aneurysm itself measures approximately 14 x 11 x 19 mm. There is intimal irregularity about the adjacent cervical left ICA at this level, suggesting a possible acute component/thrombus (series 14, image 400). Associated stenosis measures up to approximately 50% by NASCET criteria. Distally, the left ICA is otherwise patent to the skull base. Vertebral arteries: Both vertebral arteries arise from the subclavian arteries. No proximal subclavian artery stenosis. Right vertebral artery slightly dominant. Atheromatous change at the origins of both vertebral arteries with associated severe ostial stenosis on the right (series 14, image 217). No more than mild stenosis on the left. Otherwise, vertebral arteries patent without stenosis, dissection or occlusion. Skeleton: Mild compression deformities noted involving the superior endplates of T4 and T5, somewhat age indeterminate, but new as  compared to priors chest CT from 02/19/2019 (series 18, image 143). No other visible acute osseous finding. Prior ACDF at C3-4 with solid arthrodesis. No discrete or worrisome osseous lesions. Patient is edentulous. Other neck: No other acute soft tissue abnormality within the neck. No mass or adenopathy. Upper chest: Scattered centrilobular emphysema. 8 mm nodule present along the right major fissure (series 18, image 117). Review of the MIP images confirms the above findings CTA HEAD FINDINGS Anterior circulation: Petrous segments patent bilaterally. Scattered atheromatous change throughout the carotid siphons with associated mild to moderate multifocal narrowing. A1 segments patent  bilaterally. Normal anterior communicating artery complex. Anterior cerebral arteries patent to their distal aspects without hemodynamically significant stenosis. M1 segments patent without stenosis or occlusion. Normal MCA bifurcations. Distal MCA branches well perfused and symmetric. Posterior circulation: Diminutive vertebral arteries patent to the vertebrobasilar junction without stenosis. Both PICA origins patent and normal. Basilar diffusely diminutive but patent to its distal aspect. Superior cerebellar arteries patent bilaterally. Predominant fetal type origin of both PCAs. PCAs mildly irregular but patent to their distal aspects without high-grade stenosis. Venous sinuses: Grossly patent allowing for timing the contrast bolus. Anatomic variants: Fetal type origin of the PCAs with overall diminutive vertebrobasilar system. Review of the MIP images confirms the above findings IMPRESSION: CT HEAD IMPRESSION: 1. Stable head CT. Previously identified small left frontal infarct not visible by CT. No other acute intracranial abnormality. 2. Advanced cerebral atrophy with chronic microvascular ischemic disease, with multiple chronic bilateral cerebellar infarcts. 3. 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 CTA HEAD AND NECK IMPRESSION: 1. Negative CTA for large vessel occlusion. 2. 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 Associated stenoses measures up to approximately 50% on the left and 35% on the right. The right pseudoaneurysm is largely chronic in appearance, while the left demonstrates associated intimal irregularity, suggesting intraluminal thrombus related to an acute component. 3. Atheromatous change throughout the carotid siphons with associated mild to moderate multifocal narrowing. 4. Fetal type origin of the PCAs with overall diminutive vertebrobasilar system. Severe ostial stenosis at the origin of the right vertebral  artery. 5. Mild compression deformities involving the superior endplates of T4 and T5, age indeterminate, but new as compared to prior chest CT from 02/19/2019. Correlation with physical exam recommended. 6. 8 mm right upper lobe nodule, indeterminate. Non-contrast chest CT at 6-12 months is recommended. If the nodule is stable at time of repeat CT, then future CT at 18-24 months (from today's scan) is considered optional for low-risk patients, but is recommended for high-risk patients. This recommendation follows the consensus statement: Guidelines for Management of Incidental Pulmonary Nodules Detected on CT Images: From the Fleischner Society 2017; Radiology 2017; 284:228-243. 7. Aortic Atherosclerosis (ICD10-I70.0) and Emphysema (ICD10-J43.9). These results were communicated to Dr. Curly Shores at 3:01 am on 05/18/2021 by text page via the [facility] messaging system. Electronically Signed   By: Jeannine Boga M.D.   On: 05/18/2021 03:00   MR BRAIN WO CONTRAST  Result Date: 05/17/2021 CLINICAL DATA:  Right-sided weakness, altered mental status EXAM: MRI HEAD WITHOUT CONTRAST TECHNIQUE: Multiplanar, multiecho pulse sequences of the brain and surrounding structures were obtained without intravenous contrast. COMPARISON:  Noncontrast CT head obtained 1 day prior FINDINGS: Image quality is moderately motion degraded. Brain: There are small foci of diffusion restriction in the left frontal lobe inferolaterally. There is no other evidence of acute infarct. There is no acute intracranial hemorrhage or extra-axial fluid collection. There are  multiple remote lacunar infarcts in the cerebellar hemispheres and right basal ganglia. There is moderate parenchymal volume loss with ex vacuo enlargement of the ventricular system. There is mild FLAIR signal abnormality in the subcortical and periventricular white matter likely reflecting sequela of chronic white matter microangiopathy. A 1.0 cm right parafalcine lesion likely  reflecting a meningioma is stable. There is no new solid mass lesion. There is no midline shift. Vascular: Normal flow voids. Skull and upper cervical spine: Normal marrow signal. Sinuses/Orbits: The imaged paranasal sinuses are clear. The globes and orbits are unremarkable. Other: None. IMPRESSION: 1. Small acute infarct in the left frontal lobe inferolaterally. 2. Moderate global parenchymal volume loss with enlargement of the ventricular system, advanced for age. 3. 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 Electronically Signed   By: Valetta Mole M.D.   On: 05/17/2021 11:32   DG CHEST PORT 1 VIEW  Result Date: 05/16/2021 CLINICAL DATA:  Right-sided weakness. EXAM: PORTABLE CHEST 1 VIEW COMPARISON:  Chest radiograph dated 03/14/2021. FINDINGS: No focal consolidation, pleural effusion or pneumothorax. The cardiac silhouette is within normal limits. Atherosclerotic calcification of the aorta. No acute osseous pathology. IMPRESSION: No active disease. Electronically Signed   By: Anner Crete M.D.   On: 05/16/2021 23:26   CT HEAD CODE STROKE WO CONTRAST  Result Date: 05/16/2021 CLINICAL DATA:  Code stroke. EXAM: CT HEAD WITHOUT CONTRAST TECHNIQUE: Contiguous axial images were obtained from the base of the skull through the vertex without intravenous contrast. COMPARISON:  03/13/2021 FINDINGS: Brain: No evidence of acute infarction, hemorrhage, cerebral edema, mass, mass effect, or midline shift. No extra-axial fluid collection. Periventricular white matter changes, likely the sequela of chronic small vessel ischemic disease. Lacunar infarcts in the cerebellum, right-greater-than-left. Vascular: No hyperdense vessel. Atherosclerotic calcifications in the intracranial carotid and vertebral arteries. Skull: Normal. Negative for fracture or focal lesion. Sinuses/Orbits: No acute finding. Other: The mastoid air cells are well aerated. ASPECTS [facility] Stroke Program Early CT Score) - Ganglionic level  infarction (caudate, lentiform nuclei, internal capsule, insula, M1-M3 cortex): 7 - Supraganglionic infarction (M4-M6 cortex): 3 Total score (0-10 with 10 being normal): 10 IMPRESSION: 1. No acute intracranial process. 2. ASPECTS is 10 Code stroke imaging results were communicated on 05/16/2021 at 8:33 pm to provider Dr. Curly Shores via secure text paging. Electronically Signed   By: Merilyn Baba M.D.   On: 05/16/2021 20:34   CT ANGIO HEAD NECK W WO CM (CODE STROKE)  Result Date: 05/18/2021 CLINICAL DATA:  Neuro deficit, acute, stroke suspected Unstable plaque and recent focal status, Todd's paralysis vs. L MCA occlusion EXAM: CT ANGIOGRAPHY HEAD AND NECK TECHNIQUE: Multidetector CT imaging of the head and neck was performed using the standard protocol during bolus administration of intravenous contrast. Multiplanar CT image reconstructions and MIPs were obtained to evaluate the vascular anatomy. Carotid stenosis measurements (when applicable) are obtained utilizing NASCET criteria, using the distal internal carotid diameter as the denominator. CONTRAST:  90mL OMNIPAQUE IOHEXOL 350 MG/ML SOLN COMPARISON:  CTA head/neck from 05/17/2020. CT chest February 19, 2019. FINDINGS: CTA NECK FINDINGS Aortic arch: No hemodynamically significant stenosis of the great vessel origins. Atherosclerosis. Right carotid system: Similar atherosclerosis of the common carotid artery without greater than 50% stenosis. Similar moderate predominately calcific atherosclerosis at the carotid bifurcation without greater than 50% stenosis. Similar smooth narrowing of the mid cervical right internal carotid artery with adjacent thrombosed focal outpouching with peripheral calcification, suggestive of dissecting pseudoaneurysm that is likely chronic. Thrombosed aneurysm is similar in size, measuring  approximately 5 x 12 mm. Associated stenosis is similar, measuring approximately 35% relative to the distal vessel. Left carotid system: Moderate  predominately calcific atherosclerosis at the carotid bifurcation without greater than 50% stenosis. 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 As before, the pseudoaneurysm is also largely thrombosed with small area of filling superiorly. Similar intimal irregularity and narrowing of the associated ICA with approximally 50% stenosis relative to the distal vessel. Vertebral arteries: Similar severe stenosis of the right vertebral artery origin and mild stenosis of the left vertebral artery origin. Otherwise, vertebral arteries are patent without significant stenosis. Skeleton: Similar mild compression deformities of the superior endplates of T4. The previously seen T5 compression deformity is not imaged. C3-C4 ACDF. Other neck: No acute findings. Upper chest: Incidentally imaged approximately 6 mm pulmonary nodule in the right upper lobe, previously characterized as benign on 2020 CT chest. Visualized lung apices are clear. Emphysema. Review of the MIP images confirms the above findings CTA HEAD FINDINGS Anterior circulation: Similar scattered atherosclerosis of bilateral intracranial ICAs with associated multifocal mild-to-moderate narrowing. Bilateral ACAs are patent without proximal hemodynamically significant stenosis. Bilateral MCAs are patent without proximal hemodynamically significant stenosis. Posterior circulation: Small vertebrobasilar system with bilateral fetal type PCAs, anatomic variant and similar to prior. Bilateral PCAs are patent without proximal hemodynamically significant stenosis. Venous sinuses: As permitted by contrast timing, patent. Anatomic variants: See above. Other: Incidentally imaged 14 by 8 mm homogeneously enhancing dural-based mass along the anterior right falx, compatible with known meningioma that was better characterized on prior MRI. Review of the MIP images confirms the above findings  IMPRESSION: 1. No change from the CTA from yesterday. 2. No large vessel occlusion or proximal hemodynamically significant stenosis intracranially. 3. 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 Similar associated stenosis of the ICAs, approximately 50% on the left and 35% on the right. 4. Atherosclerosis of the intracranial ICAs with mild-to-moderate narrowing. 5. 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 6. Mild compression deformities of mid thoracic vertebral bodies, better characterized on CTA from yesterday. 7. Incidentally imaged approximately 6 mm pulmonary nodule in the right upper lobe, previously characterized as benign on 2020 CT chest. 8. Aortic Atherosclerosis (ICD10-I70.0) and Emphysema (ICD10-J43.9). Electronically Signed   By: Margaretha Sheffield M.D.   On: 05/18/2021 08:09     LOS: 1 day   Oren Binet, MD  [facility]    To contact the attending provider between 7A-7P or the covering provider during after hours 7P-7A, please log into the web site [email or web address] and access using universal [place] password for that web site. If you do not have the password, please call the hospital operator.  05/18/2021, 10:01 AM

## 2021-05-19 ENCOUNTER — Observation Stay (HOSPITAL_COMMUNITY): Payer: Medicare Other

## 2021-05-19 DIAGNOSIS — G40909 Epilepsy, unspecified, not intractable, without status epilepticus: Secondary | ICD-10-CM | POA: Diagnosis not present

## 2021-05-19 DIAGNOSIS — I6330 Cerebral infarction due to thrombosis of unspecified cerebral artery: Secondary | ICD-10-CM | POA: Diagnosis not present

## 2021-05-19 DIAGNOSIS — F1020 Alcohol dependence, uncomplicated: Secondary | ICD-10-CM | POA: Diagnosis not present

## 2021-05-19 DIAGNOSIS — R569 Unspecified convulsions: Secondary | ICD-10-CM | POA: Diagnosis not present

## 2021-05-19 LAB — CBC
HCT: 34.2 % — ABNORMAL LOW (ref 39.0–52.0)
Hemoglobin: 10.8 g/dL — ABNORMAL LOW (ref 13.0–17.0)
MCH: 28.8 pg (ref 26.0–34.0)
MCHC: 31.6 g/dL (ref 30.0–36.0)
MCV: 91.2 fL (ref 80.0–100.0)
Platelets: 117 10*3/uL — ABNORMAL LOW (ref 150–400)
RBC: 3.75 MIL/uL — ABNORMAL LOW (ref 4.22–5.81)
RDW: 19.3 % — ABNORMAL HIGH (ref 11.5–15.5)
WBC: 10.2 10*3/uL (ref 4.0–10.5)
nRBC: 0.2 % (ref 0.0–0.2)

## 2021-05-19 LAB — CALCIUM, IONIZED: Calcium, Ionized, Serum: 5.2 mg/dL (ref 4.5–5.6)

## 2021-05-19 LAB — COMPREHENSIVE METABOLIC PANEL
ALT: 16 U/L (ref 0–44)
AST: 42 U/L — ABNORMAL HIGH (ref 15–41)
Albumin: 2.7 g/dL — ABNORMAL LOW (ref 3.5–5.0)
Alkaline Phosphatase: 67 U/L (ref 38–126)
Anion gap: 16 — ABNORMAL HIGH (ref 5–15)
BUN: 6 mg/dL — ABNORMAL LOW (ref 8–23)
CO2: 18 mmol/L — ABNORMAL LOW (ref 22–32)
Calcium: 8.6 mg/dL — ABNORMAL LOW (ref 8.9–10.3)
Chloride: 104 mmol/L (ref 98–111)
Creatinine, Ser: 1.2 mg/dL (ref 0.61–1.24)
GFR, Estimated: >60 mL/min (ref >60)
Glucose, Bld: 73 mg/dL (ref 70–99)
Potassium: 4.2 mmol/L (ref 3.5–5.1)
Sodium: 138 mmol/L (ref 135–145)
Total Bilirubin: 1.4 mg/dL — ABNORMAL HIGH (ref 0.3–1.2)
Total Protein: 5.7 g/dL — ABNORMAL LOW (ref 6.5–8.1)

## 2021-05-19 LAB — MAGNESIUM: Magnesium: 1.9 mg/dL (ref 1.7–2.4)

## 2021-05-19 MED ORDER — FOLIC ACID 5 MG/ML IJ SOLN
1.0000 mg | Freq: Every day | INTRAMUSCULAR | Status: DC
  Administered 2021-05-19 – 2021-05-20 (×2): 1 mg via INTRAVENOUS
  Filled 2021-05-19 (×3): qty 0.2

## 2021-05-19 MED ORDER — MAGNESIUM SULFATE 2 GM/50ML IV SOLN
2.0000 g | Freq: Once | INTRAVENOUS | Status: AC
  Administered 2021-05-19: 2 g via INTRAVENOUS
  Filled 2021-05-19 (×2): qty 50

## 2021-05-19 MED ORDER — PANTOPRAZOLE SODIUM 40 MG PO TBEC
40.0000 mg | DELAYED_RELEASE_TABLET | Freq: Every day | ORAL | Status: DC
  Administered 2021-05-20 – 2021-05-24 (×5): 40 mg via ORAL
  Filled 2021-05-19 (×5): qty 1

## 2021-05-19 MED ORDER — PHENOBARBITAL SODIUM 130 MG/ML IJ SOLN
65.0000 mg | Freq: Two times a day (BID) | INTRAMUSCULAR | Status: DC
  Administered 2021-05-19 – 2021-05-22 (×7): 65 mg via INTRAVENOUS
  Filled 2021-05-19 (×8): qty 1

## 2021-05-19 NOTE — Procedures (Addendum)
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 EEG to evaluate for seizure.   Level of alertness: Lethargic   AEDs during EEG study: PB, LCM, LEV, Versed  Technical aspects: This EEG study was done with scalp electrodes positioned according to the 10-20 International system of electrode placement. Electrical activity was acquired at a sampling rate of 500Hz  and reviewed with a high frequency filter of 70Hz  and a low frequency filter of 1Hz . EEG data were recorded continuously and digitally stored.   Description: No clear posterior dominant rhythm was seen. EEG showed continuous generalized 3 to 5 Hz theta- delta slowing.  Lateralized periodic discharges with overriding fast activity were noted in left hemisphere at 1 Hz which at times appeared rhythmic without evolution.   ABNORMALITY - Lateralized periodic discharges with overriding fast activity ( LPD +), left hemisphere - Continuous slow, generalized   IMPRESSION: This study showed lateralized periodic discharges with overriding fast activity in left hemisphere which are on the ictal-interictal continuum with high potential for seizure recurrence.  There is also moderate to severe diffuse encephalopathy, nonspecific to etiology but could be secondary to seizures.  Seth [name] Seth [name]

## 2021-05-19 NOTE — Evaluation (Signed)
Occupational Therapy Evaluation Patient Details Name: Seth [name] MRN: [id number] DOB: 04/22/1956 Today's Date: 05/19/2021   History of Present Illness Patient is a 65 y.o. male who presents with confusion and right-sided weakness-thought to have breakthrough seizures and Todd's paresis. 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 PMH significant for SDH (5/22-likely traumatic) EtOH use, nonadherent to medications, L transmetatarsal amputation.   Clinical Impression   Pt admitted for concerns listed above. PTA pt reported that he was living alone, independent with all ADL's and IADL's, except for driving. Pt reports using a cane at baseline. At this time, pt requires max-total A +2 for all ADL's and bed mobility. Additionally, pt demonstrating cognitive deficits, requiring verbal cueing and assist for initiation, recall/STM, and awareness. Pt will benefit from SNF level therapies to maximize return to independence. OT will follow acutely.     Recommendations for follow up therapy are one component of a multi-disciplinary discharge planning process, led by the attending physician.  Recommendations may be updated based on patient status, additional functional criteria and insurance authorization.   Follow Up Recommendations  SNF;Supervision/Assistance - 24 hour    Equipment Recommendations  Other (comment) (TBD by next venue of care)    Recommendations for Other Services       Precautions / Restrictions Precautions Precautions: Fall Restrictions Weight Bearing Restrictions: No      Mobility Bed Mobility Overal bed mobility: Needs Assistance Bed Mobility: Rolling Rolling: Total assist;+2 for physical assistance         General bed mobility comments: Several rolls for peri-care and linen change s/p bowel movement in the bed. +2 total assist required for all aspects of bed mobility at this time.    Transfers                 General  transfer comment: Unable to attempt    Balance Overall balance assessment:  (Unable to assess)                                         ADL either performed or assessed with clinical judgement   ADL Overall ADL's : Needs assistance/impaired Eating/Feeding: Maximal assistance;Bed level   Grooming: Maximal assistance;Bed level   Upper Body Bathing: Maximal assistance;Bed level   Lower Body Bathing: Total assistance;+2 for safety/equipment;+2 for physical assistance;Bed level   Upper Body Dressing : Maximal assistance;Bed level   Lower Body Dressing: Total assistance;+2 for safety/equipment;+2 for physical assistance;Bed level       Toileting- Clothing Manipulation and Hygiene: Total assistance;+2 for physical assistance;+2 for safety/equipment;Bed level         General ADL Comments: Pt remained bed level this session due to continuous EEG and continuous incontinence of bowels. Requiring total A for all ADL's due to cognition and limited R side movement     Vision Baseline Vision/History: 1 Wears glasses Ability to See in Adequate Light: 1 Impaired Patient Visual Report: No change from baseline Vision Assessment?: Vision impaired- to be further tested in functional context Additional Comments: Unable to assess well this session due to clean up and bed change, then pt fatigued.     Perception Perception Perception Tested?: No   Praxis Praxis Praxis tested?: Not tested    Pertinent Vitals/Pain Pain Assessment: Faces Faces Pain Scale: Hurts little more Pain Location: Generalized grimacing with movement Pain Descriptors / Indicators: Grimacing;Discomfort Pain Intervention(s): Limited activity  within patient's tolerance;Monitored during session;Repositioned     Hand Dominance Right   Extremity/Trunk Assessment Upper Extremity Assessment Upper Extremity Assessment: Generalized weakness;RUE deficits/detail RUE Deficits / Details: Minimal muscle  contractions, slow to initiate movement, very minimal movement on commands. RUE Sensation: decreased light touch RUE Coordination: decreased fine motor;decreased gross motor   Lower Extremity Assessment Lower Extremity Assessment: Defer to PT evaluation       Communication Communication Communication: No difficulties (Low volume)   Cognition Arousal/Alertness: Awake/alert Behavior During Therapy: WFL for tasks assessed/performed Overall Cognitive Status: Impaired/Different from baseline Area of Impairment: Awareness;Problem solving;Memory                     Memory: Decreased short-term memory     Awareness: Emergent Problem Solving: Slow processing;Requires verbal cues General Comments: Pt was not aware that he had a BM and as therapy and NT were cleaning him up, got frustrated with having to roll so much, as he did not remember we were cleaning him up from BM.   General Comments  Pt required clean up and linen change x2 this session due to incontenince of bowels. VSS on RA. Continuous EEG.    Exercises     Shoulder Instructions      Home Living Family/patient expects to be discharged to:: Private residence Living Arrangements: Alone Available Help at Discharge: Family;Available PRN/intermittently Type of Home: House Home Access: Level entry     Home Layout: One level     Bathroom Shower/Tub: Teacher, early years/pre: Standard Bathroom Accessibility: Yes How Accessible: Accessible via [place] Home Equipment: [place] - 2 wheels;Cane - single point;Grab bars - tub/shower;Tub bench          Prior Functioning/Environment Level of Independence: Independent with assistive device(s)        Comments: From pt and chart--uses cane, family taks him to appts (does not drive). Could make small meals for himself.        OT Problem List: Decreased strength;Decreased range of motion;Decreased activity tolerance;Impaired balance (sitting and/or  standing);Decreased coordination;Decreased cognition;Decreased safety awareness;Impaired UE functional use;Pain;Impaired tone      OT Treatment/Interventions: Self-care/ADL training;Therapeutic exercise;Neuromuscular education;Energy conservation;DME and/or AE instruction;Therapeutic activities;Cognitive remediation/compensation;Patient/family education;Balance training    OT Goals(Current goals can be found in the care plan section) Acute Rehab OT Goals Patient Stated Goal: None stated OT Goal Formulation: Patient unable to participate in goal setting Time For Goal Achievement: 05/19/21 Potential to Achieve Goals: Fair ADL Goals Pt Will Perform Grooming: with supervision;sitting Pt Will Perform Lower Body Bathing: with mod assist;sitting/lateral leans;sit to/from stand Pt Will Perform Lower Body Dressing: with mod assist;sitting/lateral leans;sit to/from stand Pt Will Transfer to Toilet: with mod assist;stand pivot transfer Pt Will Perform Toileting - Clothing Manipulation and hygiene: with mod assist;sitting/lateral leans;sit to/from stand  OT Frequency: Min 2X/week   Barriers to D/C:    Pt lives alone       Co-evaluation              AM-PAC OT "6 Clicks" Daily Activity     Outcome Measure Help from another person eating meals?: A Lot Help from another person taking care of personal grooming?: A Lot Help from another person toileting, which includes using toliet, bedpan, or urinal?: Total Help from another person bathing (including washing, rinsing, drying)?: Total Help from another person to put on and taking off regular upper body clothing?: A Lot Help from another person to put on and taking off regular lower body  clothing?: Total 6 Click Score: 9   End of Session Nurse Communication: Mobility status  Activity Tolerance: Patient limited by fatigue;Other (comment) (Limited by bowel incontenince) Patient left: in bed;with call bell/phone within reach;with bed alarm  set  OT Visit Diagnosis: Unsteadiness on feet (R26.81);Other abnormalities of gait and mobility (R26.89);Muscle weakness (generalized) (M62.81);Other symptoms and signs involving the nervous system (R29.898)                Time: 2355-7322 OT Time Calculation (min): 35 min Charges:  OT General Charges $OT Visit: 1 Visit OT Evaluation $OT Eval Moderate Complexity: 1 Mod OT Treatments $Self Care/Home Management : 8-22 mins  Zafar Debrosse H., OTR/L Acute Rehabilitation  Houa Ackert Elane Yolanda Bonine 05/19/2021, 12:14 PM

## 2021-05-19 NOTE — Progress Notes (Signed)
Neurology Progress Note   S:// Seen and examined.  No focal seizure activity noted.  O:// Current vital signs: BP (!) 151/92 (BP Location: Left Arm)   Pulse 98   Temp 99.2 F (37.3 C) (Oral)   Resp 18   Ht 6\' 3"  (1.905 m)   Wt 72.3 kg   SpO2 100%   BMI 19.92 kg/m  Vital signs in last 24 hours: Temp:  [98.6 F (37 C)-99.5 F (37.5 C)] 99.2 F (37.3 C) (09/22 0746) Pulse Rate:  [98-124] 98 (09/22 0746) Resp:  [10-44] 18 (09/22 0746) BP: (119-160)/(76-114) 151/92 (09/22 0746) SpO2:  [96 %-100 %] 100 % (09/22 0746) Weight:  [72.3 kg] 72.3 kg (09/22 0514)   General: Awake alert HEENT: Normocephalic/atraumatic Lungs: Clear next cardiovascular: Regular rhythm Abdomen soft nondistended nontender Extremities warm well perfused Neurological exam Is awake, attends to the examiner. Is able to tell me his name. Not able to tell me his correct age Pupils equal round reactive to light No visual field deficits-blinks to threat from both sides No gaze preference or deviation Facial symmetry preserved Both upper extremities have increased tone with tone worse on the right upper extremity.  Minimal antigravity movement in both upper extremities.  Withdrawal to noxious stimulation in the lower extremities.  Medications  Current Facility-Administered Medications:    acetaminophen (TYLENOL) tablet 650 mg, 650 mg, Oral, Q6H PRN **OR** acetaminophen (TYLENOL) suppository 650 mg, 650 mg, Rectal, Q6H PRN, Etta Quill, DO, 650 mg at 05/17/21 0255   aspirin chewable tablet 81 mg, 81 mg, Oral, Daily, Ghimire, Henreitta Leber, MD, 81 mg at 05/17/21 6962   clopidogrel (PLAVIX) tablet 300 mg, 300 mg, Oral, Once **FOLLOWED BY** clopidogrel (PLAVIX) tablet 75 mg, 75 mg, Oral, Daily, Bhagat, Srishti L, MD   cyanocobalamin ((VITAMIN B-12)) injection 1,000 mcg, 1,000 mcg, Subcutaneous, Daily, Ghimire, Henreitta Leber, MD, 1,000 mcg at 05/18/21 1236   enoxaparin (LOVENOX) injection 40 mg, 40 mg, Subcutaneous,  QHS, Gardner, Jared M, DO, 40 mg at 05/18/21 2226   fluticasone furoate-vilanterol (BREO ELLIPTA) 200-25 MCG/INH 1 puff, 1 puff, Inhalation, Daily, Ghimire, Henreitta Leber, MD   folic acid injection 1 mg, 1 mg, Intravenous, Daily, Pham, Minh Q, RPH-CPP   [COMPLETED] lacosamide (VIMPAT) 200 mg in sodium chloride 0.9 % 25 mL IVPB, 200 mg, Intravenous, Once, Stopped at 05/18/21 0727 **FOLLOWED BY** lacosamide (VIMPAT) 100 mg in sodium chloride 0.9 % 25 mL IVPB, 100 mg, Intravenous, Q12H, Bhagat, Srishti L, MD, Last Rate: 70 mL/hr at 05/18/21 2226, 100 mg at 05/18/21 2226   lactated ringers infusion, , Intravenous, Continuous, Alcario Drought, Jared M, DO, Stopped at 05/18/21 1313   levETIRAcetam (KEPPRA) IVPB 1000 mg/100 mL premix, 1,000 mg, Intravenous, Q12H, Ghimire, Shanker M, MD, Last Rate: 400 mL/hr at 05/18/21 2253, 1,000 mg at 05/18/21 2253   LORazepam (ATIVAN) tablet 1-4 mg, 1-4 mg, Oral, Q1H PRN **OR** LORazepam (ATIVAN) injection 1-4 mg, 1-4 mg, Intravenous, Q1H PRN, Etta Quill, DO, 1 mg at 05/17/21 1856   magnesium sulfate IVPB 2 g 50 mL, 2 g, Intravenous, Once, Ghimire, Henreitta Leber, MD   midazolam (VERSED) injection 2 mg, 2 mg, Intravenous, Q6H PRN, Bhagat, Srishti L, MD, 2 mg at 05/18/21 0426   midazolam (VERSED) injection 2 mg, 2 mg, Intravenous, Q10 min PRN, Bhagat, Srishti L, MD, 2 mg at 05/18/21 0546   multivitamin with minerals tablet 1 tablet, 1 tablet, Oral, Daily, Jennette Kettle M, DO, 1 tablet at 05/17/21 0949   ondansetron (ZOFRAN) tablet 4  mg, 4 mg, Oral, Q6H PRN **OR** ondansetron (ZOFRAN) injection 4 mg, 4 mg, Intravenous, Q6H PRN, Alcario Drought, Jared M, DO   pantoprazole (PROTONIX) injection 40 mg, 40 mg, Intravenous, Daily, Ghimire, Henreitta Leber, MD, 40 mg at 05/18/21 1229   PHENObarbital (LUMINAL) injection 65 mg, 65 mg, Intravenous, BID, Ghimire, Henreitta Leber, MD   rosuvastatin (CRESTOR) tablet 20 mg, 20 mg, Oral, Daily, Ghimire, Henreitta Leber, MD   thiamine 500mg  in normal saline (67ml) IVPB, 500  mg, Intravenous, Q8H, Gardner, Jared M, DO, Last Rate: 100 mL/hr at 05/19/21 0603, 500 mg at 05/19/21 0603   umeclidinium bromide (INCRUSE ELLIPTA) 62.5 MCG/INH 1 puff, 1 puff, Inhalation, Daily, Jonetta Osgood, MD Labs CBC    Component Value Date/Time   WBC 10.2 05/19/2021 0345   RBC 3.75 (L) 05/19/2021 0345   HGB 10.8 (L) 05/19/2021 0345   HGB 12.1 (L) 05/08/2018 1317   HCT 34.2 (L) 05/19/2021 0345   HCT 28.2 (L) 02/21/2019 0554   PLT 117 (L) 05/19/2021 0345   PLT 225 05/08/2018 1317   MCV 91.2 05/19/2021 0345   MCV 100 (H) 05/08/2018 1317   MCH 28.8 05/19/2021 0345   MCHC 31.6 05/19/2021 0345   RDW 19.3 (H) 05/19/2021 0345   RDW 17.5 (H) 05/08/2018 1317   LYMPHSABS 1.0 05/16/2021 2015   LYMPHSABS 1.7 05/08/2018 1317   MONOABS 1.3 (H) 05/16/2021 2015   EOSABS 0.0 05/16/2021 2015   EOSABS 0.2 05/08/2018 1317   BASOSABS 0.0 05/16/2021 2015   BASOSABS 0.0 05/08/2018 1317    CMP     Component Value Date/Time   NA 138 05/19/2021 0345   NA 145 (H) 05/08/2018 1317   K 4.2 05/19/2021 0345   CL 104 05/19/2021 0345   CO2 18 (L) 05/19/2021 0345   GLUCOSE 73 05/19/2021 0345   BUN 6 (L) 05/19/2021 0345   BUN 8 05/08/2018 1317   CREATININE 1.20 05/19/2021 0345   CALCIUM 8.6 (L) 05/19/2021 0345   PROT 5.7 (L) 05/19/2021 0345   PROT 6.8 05/08/2018 1317   ALBUMIN 2.7 (L) 05/19/2021 0345   ALBUMIN 4.2 05/08/2018 1317   AST 42 (H) 05/19/2021 0345   ALT 16 05/19/2021 0345   ALKPHOS 67 05/19/2021 0345   BILITOT 1.4 (H) 05/19/2021 0345   BILITOT 0.5 05/08/2018 1317   GFRNONAA >60 05/19/2021 0345   GFRAA >60 06/03/2019 0514   Routine EEG yesterday Impression: This EEG was obtained while sedated on midazolam and phenobarbital and drowsy and is abnormal due to: - Mild diffuse slowing indicating global cerebral dysfunction - Sharp waves over the left hemisphere indicating increased epileptogenicity in this region  Beta frequencies are secondary to medication effect from  benzodiazepines.  There were no electrographic seizures seen in this recording.  LTM EEG from overnight with lateralized periodic discharges (LPDs) with overriding fast activity in the left hemisphere which are on the ictal interictal continuum with high potential for seizure recurrence.  Also moderate to severe diffuse encephalopathy with nonspecific etiology but could be secondary to seizures.   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 Electrographically continues to have lateralized.  Discharges although no seizures were seen-these pattern sometimes have high propensity for recurrence. Will watch him 1 more day on LTM EEG.  Recommendations: Still not completely  back to baseline Will continue 1 more day of LTM Continue current antiepileptics Will follow.  -- Amie Portland, MD Neurologist [facility] Pager: [phone]
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
## 2021-05-19 NOTE — Progress Notes (Signed)
LTM maint complete - no skin breakdown under: Fp1 F3 F7 Maintenance Cz, P3 Fp1 [facility] monitored, Event button test confirmed by [facility].

## 2021-05-19 NOTE — Progress Notes (Signed)
Update given to sister when she called

## 2021-05-19 NOTE — TOC Initial Note (Signed)
Transition of Care [facility]) - Initial/Assessment Note    Patient Details  Name: Seth [name] MRN: [id number] Date of Birth: February 11, 1956  Transition of Care [facility] CM/SW Contact:    Pollie Friar, RN Phone Number: 05/19/2021, 3:52 PM  Clinical Narrative:                 Patient lives at home alone and has been having falls. Recommendations are for SNF rehab. CM met with the patient and he is in agreement. He gave CM permission to speak to his daughter. CM called [name] and she also agreed. They asked that he be faxed out in the [facility] area. Charity states he has been to [facility] before and they would be good with him going there again.  Pt states he has had 2 covid vaccines and 1 booster.  TOC following.  Expected Discharge Plan: Skilled Nursing Facility Barriers to Discharge: Continued Medical Work up   Patient Goals and CMS Choice   CMS Medicare.gov Compare Post Acute Care list provided to:: Patient Choice offered to / list presented to : Patient, Adult Children  Expected Discharge Plan and Services Expected Discharge Plan: [place] In-house Referral: Clinical Social Work Discharge Planning Services: CM Consult Post Acute Care Choice: [place] arrangements for the past 2 months: [place]                                      Prior Living Arrangements/Services Living arrangements for the past 2 months: Single Family Home Lives with:: Self Patient language and need for interpreter reviewed:: Yes Do you feel safe going back to the place where you live?: Yes      Need for Family Participation in Patient Care: Yes (Comment) Care giver support system in place?: No (comment) Current home services: DME (cane/[place]/ shower seat) Criminal Activity/Legal Involvement Pertinent to Current Situation/Hospitalization: No - Comment as needed  Activities of Daily Living      Permission Sought/Granted                   Emotional Assessment Appearance:: Appears stated age Attitude/Demeanor/Rapport: Engaged Affect (typically observed): Accepting Orientation: : Oriented to Self, Oriented to Situation, Oriented to Place Alcohol / Substance Use: Alcohol Use Psych Involvement: No (comment)  Admission diagnosis:  Seizure ([place]) [R56.9] Seizures ([place]) [R56.9] Seizure disorder ([place]) [G40.909] AMS (altered mental status) [R41.82] Patient Active Problem List   Diagnosis Date Noted   Cerebral thrombosis with cerebral infarction 05/18/2021   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, not intractable, with status epilepticus ([place])    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[place])    Seizures ([place]) 05/17/2021   Fever 05/17/2021   Seizure disorder ([place]) 05/17/2021   Pressure injury of skin 03/14/2021   Seizure ([place]) 03/13/2021   SDH (subdural hematoma) (H[facility])    Encephalopathy acute    Protein-calorie malnutrition, severe 01/11/2021   Benign essential HTN    Benign prostatic hyperplasia    Depression    ETOH abuse    Hyponatremia    Nonsustained ventricular tachycardia ([place])    Alcohol use 01/01/2021   History of CVA (cerebrovascular accident) 01/01/2021   Sepsis ([place]) 09/14/2020   COVID-19 virus infection 09/14/2020   Osteomyelitis of left foot ([place])    AKI (acute kidney injury) ([place]) 40/98/1191   Metabolic acidosis 47/82/9562   Sinus tachycardia 05/31/2019  DOE (dyspnea on exertion) 05/31/2019   Generalized weakness 02/19/2019   Hypertensive urgency 02/19/2019   Left fibular fracture 02/19/2019   Chronic alcoholism ([place]) 02/19/2019   Malnutrition of moderate degree 08/25/2017   Acute respiratory failure with hypoxia ([place]) 08/23/2017   Chest pain 08/23/2017   Elevated blood pressure reading 08/23/2017   Tobacco abuse 08/23/2017   COPD (chronic obstructive pulmonary disease) ([place]) 08/23/2017   PCP:  Lucianne Lei, MD Pharmacy:    [place] - [address] [place] [address] Phone: [phone] Fax: [phone]     Social Determinants of Health ([place]) Interventions    Readmission Risk Interventions Readmission Risk Prevention Plan 06/04/2019  Transportation Screening Complete  PCP or Specialist Appt within 5-7 Days Complete  Home Care Screening Complete  Medication Review (RN CM) Complete  Some recent data might be hidden

## 2021-05-19 NOTE — Progress Notes (Signed)
LTM EEG maintained. A2 and F7 reapplied. Forehead and ear leads checked = no breakdown seen. Charged.

## 2021-05-19 NOTE — Progress Notes (Signed)
PROGRESS NOTE        PATIENT DETAILS Name: Seth [name] Age: 65 y.o. Sex: male Date of Birth: [birth year] Admit Date: 05/16/2021 Admitting Physician Evalee Mutton Kristeen Mans, MD [id number], Myra Rude, MD  Brief Narrative: Patient is a 65 y.o. male with history of seizure disorder, SDH (5/22-likely traumatic) EtOH use, nonadherent to medications, s/p left foot osteomyelitis-s/p TMA-presented with confusion and right-sided weakness-thought to have breakthrough seizures and Todd's paresis.  See below for further details.  Subjective: A bit more awake and alert compared to yesterday.  Answering questions appropriately.  Did not observe any twitching of his right upper/right lower extremity while I was in the room.  Objective: Vitals: Blood pressure (!) 151/92, pulse 98, temperature 99.2 F (37.3 C), temperature source Oral, resp. rate 18, height 6\' 3"  (1.905 m), weight 72.3 kg, SpO2 100 %.   Exam: Gen Exam:Alert awake-not in any distress HEENT:atraumatic, normocephalic Chest: B/L clear to auscultation anteriorly CVS:S1S2 regular Abdomen:soft non tender, non distended Extremities:no edema Neurology: Right-sided hemiplegia Skin: no rash   Pertinent Labs/Radiology: Na: 138 K: 4.2 Creatinine: 1.20 Mg: 1.9 CK:   9/19>> COVID PCR: Negative 9/19>> UA: No significant amount of WBC. 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 9/21>> LDL:39  LTM EEG: Lateralized periodic discharges in left hemisphere on ictal-interictal continuum with high potential for seizure recurrence  Assessment/Plan: Focal status epilepticus: Not twitching observed today-more awake/alert compared to yesterday-LTM EEG in progress-on  Keppra/phenobarbital-neurology following-await further recommendations.  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  Acute left frontal CVA: On dual antiplatelets/statin-work-up as above-neurology following.  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  Will await further recommendations from neurology.  EtOH use: Awake/alert-no signs of withdrawal-continue CIWA protocol-on thiamine high-dose x3 days per neurology.    Hypomagnesemia: Replete and recheck.  Fever: Afebrile this morning-suspect fever due to seizures.  No obvious source of infection apparent.  Monitor off antibiotics-await culture data.  Rhabdomyolysis: Likely due to seizures-improving-repeat CK levels tomorrow.  HTN: BP slightly on the higher side-allow some amount of permissive hypertension.    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  8 mm right upper lobe lung nodule: Radiology recommending repeat CT chest in 6-12 months.  Procedures: None Consults: None DVT Prophylaxis: Lovenox Code Status:Full code  Family Communication: Daughter-[name]-updated over the phone on 9/21  Time spent: 35 minutes-Greater than 50% of this time was spent in counseling, explanation of diagnosis, planning of further management, and coordination of care.  Diet: Diet Order             DIET DYS 3 Room service appropriate? Yes; Fluid consistency: Thin  Diet effective now                       Disposition Plan: Status  is: Inpatient  The patient will require care spanning > 2 midnights and should be moved to inpatient because: Inpatient level of care appropriate due to severity of illness  Dispo: The patient is from: Home              Anticipated d/c is to: Home              Patient currently is not medically stable to d/c.   Difficult to place patient No    Barriers to Discharge: Concern for status epilepticus-LTM EEG in progress.  Not stable for discharge.  Antimicrobial agents: Anti-infectives (From admission, onward)    None        MEDICATIONS: Scheduled Meds:  aspirin  81 mg Oral Daily   clopidogrel  300 mg Oral Once   Followed by   clopidogrel  75 mg Oral Daily   cyanocobalamin  1,000 mcg Subcutaneous Daily   enoxaparin (LOVENOX) injection  40 mg Subcutaneous QHS   fluticasone furoate-vilanterol  1 puff Inhalation Daily   folic acid  1 mg Intravenous Daily   multivitamin with minerals  1 tablet Oral Daily   pantoprazole (PROTONIX) IV  40 mg Intravenous Daily   PHENObarbital  65 mg Intravenous BID   rosuvastatin  20 mg Oral Daily   umeclidinium bromide  1 puff Inhalation Daily   Continuous Infusions:  lacosamide (VIMPAT) IV 100 mg (05/18/21 2226)   lactated ringers 75 mL/hr at 05/19/21 1119   levETIRAcetam 1,000 mg (05/19/21 1019)   thiamine injection 500 mg (05/19/21 0603)   PRN Meds:.acetaminophen **OR** acetaminophen, LORazepam **OR** LORazepam, midazolam, midazolam, ondansetron **OR** ondansetron (ZOFRAN) IV   I have personally reviewed following labs and imaging studies  LABORATORY DATA: CBC: Recent Labs  Lab 05/16/21 2015 05/16/21 2019 05/17/21 0528 05/19/21 0345  WBC 12.2*  --  7.8 10.2  NEUTROABS 9.8*  --   --   --   HGB 13.2 14.6 10.7* 10.8*  HCT 41.2 43.0 33.9* 34.2*  MCV 89.2  --  91.4 91.2  PLT 173  --  150 117*     Basic Metabolic Panel: Recent Labs  Lab 05/16/21 2015 05/16/21 2019 05/17/21 0528  05/18/21 0607 05/19/21 0345  NA 136 134* 136 136 138  K 4.9 4.5 4.6 3.6 4.2  CL 102 104 102 104 104  CO2 17*  --  21* 20* 18*  GLUCOSE 92 96 101* 82 73  BUN 10 10 9  5* 6*  CREATININE 1.40* 1.30* 1.26* 1.02 1.20  CALCIUM 9.5  --  8.8* 8.7* 8.6*  MG  --   --  2.1 1.6* 1.9  PHOS  --   --  3.2 3.3  --      GFR: Estimated Creatinine Clearance: 64.4 mL/min (by C-G formula based on SCr of 1.2 mg/dL).  Liver Function Tests: Recent Labs  Lab 05/16/21 2015 05/18/21 0607 05/19/21 0345  AST 67* 63* 42*  ALT 18 18 16   ALKPHOS 105 70 67  BILITOT 1.1 1.3* 1.4*  PROT 7.5 5.6* 5.7*  ALBUMIN 4.0 2.8* 2.7*    No results for input(s): LIPASE, AMYLASE in the last 168 hours. No results for input(s): AMMONIA in the last 168 hours.  Coagulation Profile: Recent Labs  Lab 05/16/21 2028  INR 1.1     Cardiac Enzymes: Recent Labs  Lab 05/17/21 0528 05/18/21 0607  CKTOTAL 5,214* 3,559*     BNP (last 3 results) No results for input(s): PROBNP in the last 8760 hours.  Lipid Profile:  Recent Labs    05/18/21 0607  CHOL 152  HDL 97  LDLCALC 39  TRIG 81  CHOLHDL 1.6     Thyroid Function Tests: No results for input(s): TSH, T4TOTAL, FREET4, T3FREE, THYROIDAB in the last 72 hours.  Anemia Panel: No results for input(s): VITAMINB12, FOLATE, FERRITIN, TIBC, IRON, RETICCTPCT in the last 72 hours.  Urine analysis:    Component Value Date/Time   COLORURINE YELLOW 05/18/2021 0609   APPEARANCEUR CLEAR 05/18/2021 0609   LABSPEC 1.025 05/18/2021 0609   PHURINE 6.0 05/18/2021 0609   GLUCOSEU NEGATIVE 05/18/2021 0609   HGBUR NEGATIVE 05/18/2021 0609   BILIRUBINUR NEGATIVE 05/18/2021 0609   KETONESUR NEGATIVE 05/18/2021 0609   PROTEINUR NEGATIVE 05/18/2021 0609   NITRITE NEGATIVE 05/18/2021 0609   LEUKOCYTESUR NEGATIVE 05/18/2021 0609    Sepsis Labs: Lactic Acid, Venous    Component Value Date/Time   LATICACIDVEN 1.2 01/05/2021 0010    MICROBIOLOGY: Recent Results  (from the past 240 hour(s))  Resp Panel by RT-PCR (Flu A&B, Covid) Nasopharyngeal Swab     Status: None   Collection Time: 05/16/21  9:20 PM   Specimen: Nasopharyngeal Swab; Nasopharyngeal(NP) swabs in vial transport medium  Result Value Ref Range Status   SARS Coronavirus 2 by RT PCR NEGATIVE NEGATIVE Final    Comment: (NOTE) SARS-CoV-2 target nucleic acids are NOT DETECTED.  The SARS-CoV-2 RNA is generally detectable in upper respiratory specimens during the acute phase of infection. The lowest concentration of SARS-CoV-2 viral copies this assay can detect is 138 copies/mL. A negative result does not preclude SARS-Cov-2 infection and should not be used as the sole basis for treatment or other patient management decisions. A negative result may occur with  improper specimen collection/handling, submission of specimen other than nasopharyngeal swab, presence of viral mutation(s) within the areas targeted by this assay, and inadequate number of viral copies(<138 copies/mL). A negative result must be combined with clinical observations, patient history, and epidemiological information. The expected result is Negative.  Fact Sheet for Patients:  EntrepreneurPulse.com.au  Fact Sheet for Healthcare Providers:  IncredibleEmployment.be  This test is no t yet approved or cleared by the [place] FDA and  has been authorized for detection and/or diagnosis of SARS-CoV-2 by FDA under an Emergency Use Authorization (EUA). This EUA will remain  in effect (meaning this test can be used) for the duration of the COVID-19 declaration under Section 564(b)(1) of the Act, 21 U.S.C.section 360bbb-3(b)(1), unless the authorization is terminated  or revoked sooner.       Influenza A by PCR NEGATIVE NEGATIVE Final   Influenza B by PCR NEGATIVE NEGATIVE Final    Comment: (NOTE) The Xpert Xpress SARS-CoV-2/FLU/RSV plus assay is intended as an aid in the  diagnosis of influenza from Nasopharyngeal swab specimens and should not be used as a sole basis for treatment. Nasal washings and aspirates are unacceptable for Xpert Xpress SARS-CoV-2/FLU/RSV testing.  Fact Sheet for Patients: EntrepreneurPulse.com.au  Fact Sheet for Healthcare Providers: IncredibleEmployment.be  This test is not yet approved or cleared by the [place] FDA and has been authorized for detection and/or diagnosis of SARS-CoV-2 by FDA under an Emergency Use Authorization (EUA). This EUA will remain in effect (meaning this test can be used) for the duration of the COVID-19 declaration under Section 564(b)(1) of the Act, 21 U.S.C. section 360bbb-3(b)(1), unless the authorization is terminated or revoked.  Performed at [facility], [place] [address] [place], [place] [address]   Culture, blood (routine x 2)  Status: None (Preliminary result)   Collection Time: 05/17/21  5:20 AM   Specimen: BLOOD  Result Value Ref Range Status   Specimen Description BLOOD SITE NOT SPECIFIED  Final   Special Requests   Final    BOTTLES DRAWN AEROBIC AND ANAEROBIC Blood Culture results may not be optimal due to an inadequate volume of blood received in culture bottles   Culture   Final    NO GROWTH 1 DAY Performed at [facility], [place] [address] [place], [place] [address]    Report Status PENDING  Incomplete  Culture, blood (routine x 2)     Status: None (Preliminary result)   Collection Time: 05/17/21  5:20 AM   Specimen: BLOOD  Result Value Ref Range Status   Specimen Description BLOOD SITE NOT SPECIFIED  Final   Special Requests   Final    BOTTLES DRAWN AEROBIC AND ANAEROBIC Blood Culture results may not be optimal due to an inadequate volume of blood received in culture bottles   Culture   Final    NO GROWTH 1 DAY Performed at [facility], [place] [address], [place], [place] [address]    Report Status PENDING   Incomplete    RADIOLOGY STUDIES/RESULTS: CT ANGIO HEAD NECK W WO CM  Result Date: 05/18/2021 CLINICAL DATA:  Follow-up examination for acute stroke. EXAM: CT ANGIOGRAPHY HEAD AND NECK TECHNIQUE: Multidetector CT imaging of the head and neck was performed using the standard protocol during bolus administration of intravenous contrast. Multiplanar CT image reconstructions and MIPs were obtained to evaluate the vascular anatomy. Carotid stenosis measurements (when applicable) are obtained utilizing NASCET criteria, using the distal internal carotid diameter as the denominator. CONTRAST:  170mL OMNIPAQUE IOHEXOL 350 MG/ML SOLN COMPARISON:  Prior MRI from earlier the same day as well as previous CT from 05/16/2021. FINDINGS: CT HEAD FINDINGS Brain: Moderately advanced age-related cerebral atrophy. Patchy hypodensity involving the periventricular and deep white matter both cerebral hemispheres most consistent with chronic small vessel ischemic disease. Multiple scattered remote bilateral cerebellar infarcts. Previously identified small left frontal infarct not visible by CT. No other acute large vessel territory infarct. No acute intracranial hemorrhage. 1.4 cm right parafalcine meningioma without associated mass effect, stable. No other visible mass lesion or mass effect. Diffuse ventriculomegaly, stable from prior. No extra-axial fluid collection. Vascular: No hyperdense vessel. Calcified atherosclerosis present at skull base. Skull: Scalp soft tissues and calvarium within normal limits. Sinuses: Paranasal sinuses and mastoid air cells are clear. Orbits: Globes and orbital soft tissues demonstrate no acute finding. Review of the MIP images confirms the above findings CTA NECK FINDINGS Aortic arch: Visualized aortic arch normal in caliber with normal 3 vessel morphology. Mild-to-moderate atheromatous change within the arch itself. No hemodynamically significant stenosis seen about the origin of the great vessels.  Right carotid system: Scattered nonstenotic soft plaque seen within the right CCA. Bulky calcified plaque about the right carotid bulb/proximal right ICA without hemodynamically significant stenosis. Distally, there is mild smooth narrowing of the mid cervical right ICA. Adjacent thrombosed focal outpouching with peripheral calcification (series 14, image 411). 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 No acute appearing intraluminal thrombus or frank raised dissection flap. Thrombosed aneurysm measures approximately 5 x 6 x 12 mm in size. Associated stenosis is mild measuring no more than 35%. Distally, right ICA widely patent to the skull base. Left carotid system: Scattered nonstenotic plaque noted within the left CCA. Bulky calcified plaque about the left  carotid bulb/proximal left ICA without hemodynamically significant stenosis. There is a similar but more prominent dissecting pseudoaneurysm involving the contralateral mid cervical left ICA (series 14, image 400). This pseudoaneurysm is also largely thrombosed, although a small area of persistent filling is seen at its superior aspect (series 14, image 419). Aneurysm itself measures approximately 14 x 11 x 19 mm. There is intimal irregularity about the adjacent cervical left ICA at this level, suggesting a possible acute component/thrombus (series 14, image 400). Associated stenosis measures up to approximately 50% by NASCET criteria. Distally, the left ICA is otherwise patent to the skull base. Vertebral arteries: Both vertebral arteries arise from the subclavian arteries. No proximal subclavian artery stenosis. Right vertebral artery slightly dominant. Atheromatous change at the origins of both vertebral arteries with associated severe ostial stenosis on the right (series 14, image 217). No more than mild stenosis on the left. Otherwise, vertebral arteries patent without stenosis, dissection or occlusion. Skeleton:  Mild compression deformities noted involving the superior endplates of T4 and T5, somewhat age indeterminate, but new as compared to priors chest CT from 02/19/2019 (series 18, image 143). No other visible acute osseous finding. Prior ACDF at C3-4 with solid arthrodesis. No discrete or worrisome osseous lesions. Patient is edentulous. Other neck: No other acute soft tissue abnormality within the neck. No mass or adenopathy. Upper chest: Scattered centrilobular emphysema. 8 mm nodule present along the right major fissure (series 18, image 117). Review of the MIP images confirms the above findings CTA HEAD FINDINGS Anterior circulation: Petrous segments patent bilaterally. Scattered atheromatous change throughout the carotid siphons with associated mild to moderate multifocal narrowing. A1 segments patent bilaterally. Normal anterior communicating artery complex. Anterior cerebral arteries patent to their distal aspects without hemodynamically significant stenosis. M1 segments patent without stenosis or occlusion. Normal MCA bifurcations. Distal MCA branches well perfused and symmetric. Posterior circulation: Diminutive vertebral arteries patent to the vertebrobasilar junction without stenosis. Both PICA origins patent and normal. Basilar diffusely diminutive but patent to its distal aspect. Superior cerebellar arteries patent bilaterally. Predominant fetal type origin of both PCAs. PCAs mildly irregular but patent to their distal aspects without high-grade stenosis. Venous sinuses: Grossly patent allowing for timing the contrast bolus. Anatomic variants: Fetal type origin of the PCAs with overall diminutive vertebrobasilar system. Review of the MIP images confirms the above findings IMPRESSION: CT HEAD IMPRESSION: 1. Stable head CT. Previously identified small left frontal infarct not visible by CT. No other acute intracranial abnormality. 2. Advanced cerebral atrophy with chronic microvascular ischemic disease, with  multiple chronic bilateral cerebellar infarcts. 3. 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 CTA HEAD AND NECK IMPRESSION: 1. Negative CTA for large vessel occlusion. 2. 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 Associated stenoses measures up to approximately 50% on the left and 35% on the right. The right pseudoaneurysm is largely chronic in appearance, while the left demonstrates associated intimal irregularity, suggesting intraluminal thrombus related to an acute component. 3. Atheromatous change throughout the carotid siphons with associated mild to moderate multifocal narrowing. 4. Fetal type origin of the PCAs with overall diminutive vertebrobasilar system. Severe ostial stenosis at the origin of the right vertebral artery. 5. Mild compression deformities involving the superior endplates of T4 and T5, age indeterminate, but new as compared to prior chest CT from 02/19/2019. Correlation with physical exam recommended. 6. 8 mm right upper lobe nodule, indeterminate. Non-contrast chest CT at 6-12 months is recommended. If the nodule is stable  at time of repeat CT, then future CT at 18-24 months (from today's scan) is considered optional for low-risk patients, but is recommended for high-risk patients. This recommendation follows the consensus statement: Guidelines for Management of Incidental Pulmonary Nodules Detected on CT Images: From the Fleischner Society 2017; Radiology 2017; 284:228-243. 7. Aortic Atherosclerosis (ICD10-I70.0) and Emphysema (ICD10-J43.9). These results were communicated to Dr. Curly Shores at 3:01 am on 05/18/2021 by text page via the [facility] messaging system. Electronically Signed   By: Jeannine Boga M.D.   On: 05/18/2021 03:00   EEG adult  Result Date: 05/18/2021 Derek Jack, MD     05/18/2021 12:28 PM Routine EEG Repot Seth [name] is a 65 y.o. male with a history of seizures who is undergoing an EEG  to evaluate for seizures. Report: This EEG was acquired with electrodes placed according to the International 10-20 electrode system (including Fp1, Fp2, F3, F4, C3, C4, P3, P4, O1, O2, T3, T4, T5, T6, A1, A2, Fz, Cz, Pz). The following electrodes were missing or displaced: none. There was no clear occipital dominant rhythm. The best background was 6-8 Hz with overlying beta frequencies. This activity is reactive to stimulation. Drowsiness was manifested by background fragmentation; deeper stages of sleep were not identified. There was no focal slowing. There were intermittent sharp waves over the left hemisphere that at times briefly become rhythmic up to 1 Hz. There were no electrographic seizures identified. Photic stimulation and hyperventilation were not performed. Impression: This EEG was obtained while sedated on midazolam and phenobarbital and drowsy and is abnormal due to: - Mild diffuse slowing indicating global cerebral dysfunction - Sharp waves over the left hemisphere indicating increased epileptogenicity in this region Beta frequencies are secondary to medication effect from benzodiazepines. There were no electrographic seizures seen in this recording. Su Monks, MD [facility] [phone] If 7pm- 7am, please page neurology on call as listed in [place].   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 EEG to evaluate for seizure. Level of alertness: Lethargic AEDs during EEG study: PB, LCM, LEV, Versed Technical aspects: This EEG study was done with scalp electrodes positioned according  to the 10-20 International system of electrode placement. Electrical activity was acquired at a sampling rate of 500Hz  and reviewed with a high frequency filter of 70Hz  and a low frequency filter of 1Hz . EEG data were recorded continuously and digitally stored. Description: No clear posterior dominant rhythm was seen. EEG showed continuous generalized 3 to 5 Hz theta- delta slowing.  Lateralized periodic discharges with overriding fast activity were noted in left hemisphere at 1 Hz which at times appeared rhythmic without evolution.  ABNORMALITY - Lateralized periodic discharges with overriding fast activity ( LPD +), left hemisphere - Continuous slow, generalized  IMPRESSION: This study showed lateralized periodic discharges with overriding fast activity in left hemisphere which are on the ictal-interictal continuum with high potential for seizure recurrence.  There is also moderate to severe diffuse encephalopathy, nonspecific to etiology but could be secondary to seizures. Priyanka [name]   CT ANGIO HEAD NECK W WO CM (CODE STROKE)  Result Date: 05/18/2021 CLINICAL DATA:  Neuro deficit, acute,  stroke suspected Unstable plaque and recent focal status, Todd's paralysis vs. L MCA occlusion EXAM: CT ANGIOGRAPHY HEAD AND NECK TECHNIQUE: Multidetector CT imaging of the head and neck was performed using the standard protocol during bolus administration of intravenous contrast. Multiplanar CT image reconstructions and MIPs were obtained to evaluate the vascular anatomy. Carotid stenosis measurements (when applicable) are obtained utilizing NASCET criteria, using the distal internal carotid diameter as the denominator. CONTRAST:  71mL OMNIPAQUE IOHEXOL 350 MG/ML SOLN COMPARISON:  CTA head/neck from 05/17/2020. CT chest February 19, 2019. FINDINGS: CTA NECK FINDINGS Aortic arch: No hemodynamically significant stenosis of the great vessel origins. Atherosclerosis. Right carotid system: Similar atherosclerosis of the common  carotid artery without greater than 50% stenosis. Similar moderate predominately calcific atherosclerosis at the carotid bifurcation without greater than 50% stenosis. Similar smooth narrowing of the mid cervical right internal carotid artery with adjacent thrombosed focal outpouching with peripheral calcification, suggestive of dissecting pseudoaneurysm that is likely chronic. Thrombosed aneurysm is similar in size, measuring approximately 5 x 12 mm. Associated stenosis is similar, measuring approximately 35% relative to the distal vessel. Left carotid system: Moderate predominately calcific atherosclerosis at the carotid bifurcation without greater than 50% stenosis. 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 As before, the pseudoaneurysm is also largely thrombosed with small area of filling superiorly. Similar intimal irregularity and narrowing of the associated ICA with approximally 50% stenosis relative to the distal vessel. Vertebral arteries: Similar severe stenosis of the right vertebral artery origin and mild stenosis of the left vertebral artery origin. Otherwise, vertebral arteries are patent without significant stenosis. Skeleton: Similar mild compression deformities of the superior endplates of T4. The previously seen T5 compression deformity is not imaged. C3-C4 ACDF. Other neck: No acute findings. Upper chest: Incidentally imaged approximately 6 mm pulmonary nodule in the right upper lobe, previously characterized as benign on 2020 CT chest. Visualized lung apices are clear. Emphysema. Review of the MIP images confirms the above findings CTA HEAD FINDINGS Anterior circulation: Similar scattered atherosclerosis of bilateral intracranial ICAs with associated multifocal mild-to-moderate narrowing. Bilateral ACAs are patent without proximal hemodynamically significant stenosis. Bilateral MCAs are patent without  proximal hemodynamically significant stenosis. Posterior circulation: Small vertebrobasilar system with bilateral fetal type PCAs, anatomic variant and similar to prior. Bilateral PCAs are patent without proximal hemodynamically significant stenosis. Venous sinuses: As permitted by contrast timing, patent. Anatomic variants: See above. Other: Incidentally imaged 14 by 8 mm homogeneously enhancing dural-based mass along the anterior right falx, compatible with known meningioma that was better characterized on prior MRI. Review of the MIP images confirms the above findings IMPRESSION: 1. No change from the CTA from yesterday. 2. No large vessel occlusion or proximal hemodynamically significant stenosis intracranially. 3. 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 Similar associated stenosis of the ICAs, approximately 50% on the left and 35% on the right. 4. Atherosclerosis of the intracranial ICAs with mild-to-moderate narrowing. 5. 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 6. Mild compression deformities of mid thoracic vertebral bodies, better characterized on CTA from yesterday. 7. Incidentally imaged approximately 6 mm pulmonary nodule in the right upper lobe, previously characterized as benign on 2020 CT chest. 8. Aortic Atherosclerosis (ICD10-I70.0) and Emphysema (ICD10-J43.9). Electronically Signed   By: Margaretha Sheffield M.D.   On: 05/18/2021 08:09     LOS: 1 day  Oren Binet, MD  [facility]    To contact the attending provider between 7A-7P or the covering provider during after hours 7P-7A, please log into the web site [email or web address] and access using universal [place] password for that web site. If you do not have the password, please call the hospital operator.  05/19/2021, 11:26 AM

## 2021-05-19 NOTE — Plan of Care (Signed)
  Problem: Education: Goal: Knowledge of General Education information will improve Description: Including pain rating scale, medication(s)/side effects and non-pharmacologic comfort measures Outcome: Progressing   Problem: Health Behavior/Discharge Planning: Goal: Ability to manage health-related needs will improve Outcome: Progressing   Problem: Clinical Measurements: Goal: Will remain free from infection Outcome: Progressing Goal: Diagnostic test results will improve Outcome: Progressing Goal: Respiratory complications will improve Outcome: Progressing Goal: Cardiovascular complication will be avoided Outcome: Progressing   Problem: Activity: Goal: Risk for activity intolerance will decrease Outcome: Progressing   Problem: Nutrition: Goal: Adequate nutrition will be maintained Outcome: Progressing   Problem: Elimination: Goal: Will not experience complications related to bowel motility Outcome: Progressing Goal: Will not experience complications related to urinary retention Outcome: Progressing   Problem: Pain Managment: Goal: General experience of comfort will improve Outcome: Progressing   Problem: Safety: Goal: Ability to remain free from injury will improve Outcome: Progressing   Problem: Skin Integrity: Goal: Risk for impaired skin integrity will decrease Outcome: Progressing   Problem: Education: Goal: Expressions of having a comfortable level of knowledge regarding the disease process will increase Outcome: Progressing   Problem: Coping: Goal: Ability to adjust to condition or change in health will improve Outcome: Progressing Goal: Ability to identify appropriate support needs will improve Outcome: Progressing   Problem: Health Behavior/Discharge Planning: Goal: Compliance with prescribed medication regimen will improve Outcome: Progressing   Problem: Medication: Goal: Risk for medication side effects will decrease Outcome: Progressing    Problem: Clinical Measurements: Goal: Complications related to the disease process, condition or treatment will be avoided or minimized Outcome: Progressing Goal: Diagnostic test results will improve Outcome: Progressing   Problem: Safety: Goal: Verbalization of understanding the information provided will improve Outcome: Progressing   Problem: Self-Concept: Goal: Level of anxiety will decrease Outcome: Progressing Goal: Ability to verbalize feelings about condition will improve Outcome: Progressing

## 2021-05-19 NOTE — TOC CAGE-AID Note (Signed)
Transition of Care [facility]) - CAGE-AID Screening   Patient Details  Name: Seth [name] MRN: [id number] Date of Birth: Nov 17, 1955  Transition of Care [facility]) CM/SW Contact:    [name], [place] Phone Number: 05/19/2021, 3:11 PM   Clinical Narrative: Pt participated in [place].  Pt admitted to ETOH usage, but no substance use. Pt was offered resources.  Pt stated they did not feel that they were in need of resources at this time.  CSW will provide pt with resources for possible future use.  [name], MSW, LCSW-A Pronouns:  She/Her/Hers [facility] Clinical Social Worker Direct Number:  [phone] [name]     CAGE-AID Screening:    Have You Ever Felt You Ought to Cut Down on Your Drinking or Drug Use?: Yes Have People Annoyed You By [employer] Your Drinking Or Drug Use?: No Have You Felt Bad Or Guilty About Your Drinking Or Drug Use?: No Have You Ever Had a Drink or Used Drugs First Thing In The Morning to Steady Your Nerves or to Get Rid of a Hangover?: Yes CAGE-AID Score: 2  Substance Abuse Education Offered: Yes  Substance abuse interventions: Scientist, clinical (histocompatibility and immunogenetics)

## 2021-05-20 DIAGNOSIS — F1020 Alcohol dependence, uncomplicated: Secondary | ICD-10-CM | POA: Diagnosis not present

## 2021-05-20 DIAGNOSIS — F1290 Cannabis use, unspecified, uncomplicated: Secondary | ICD-10-CM

## 2021-05-20 DIAGNOSIS — I69398 Other sequelae of cerebral infarction: Secondary | ICD-10-CM

## 2021-05-20 DIAGNOSIS — I671 Cerebral aneurysm, nonruptured: Secondary | ICD-10-CM

## 2021-05-20 DIAGNOSIS — Z7289 Other problems related to lifestyle: Secondary | ICD-10-CM

## 2021-05-20 DIAGNOSIS — R569 Unspecified convulsions: Secondary | ICD-10-CM | POA: Diagnosis not present

## 2021-05-20 DIAGNOSIS — I10 Essential (primary) hypertension: Secondary | ICD-10-CM

## 2021-05-20 DIAGNOSIS — I6330 Cerebral infarction due to thrombosis of unspecified cerebral artery: Secondary | ICD-10-CM | POA: Diagnosis not present

## 2021-05-20 DIAGNOSIS — F17210 Nicotine dependence, cigarettes, uncomplicated: Secondary | ICD-10-CM

## 2021-05-20 DIAGNOSIS — R509 Fever, unspecified: Secondary | ICD-10-CM | POA: Diagnosis not present

## 2021-05-20 DIAGNOSIS — Z7982 Long term (current) use of aspirin: Secondary | ICD-10-CM

## 2021-05-20 LAB — CBC
HCT: 29.3 % — ABNORMAL LOW (ref 39.0–52.0)
Hemoglobin: 9 g/dL — ABNORMAL LOW (ref 13.0–17.0)
MCH: 27.9 pg (ref 26.0–34.0)
MCHC: 30.7 g/dL (ref 30.0–36.0)
MCV: 90.7 fL (ref 80.0–100.0)
Platelets: 142 10*3/uL — ABNORMAL LOW (ref 150–400)
RBC: 3.23 MIL/uL — ABNORMAL LOW (ref 4.22–5.81)
RDW: 18.8 % — ABNORMAL HIGH (ref 11.5–15.5)
WBC: 7.2 10*3/uL (ref 4.0–10.5)
nRBC: 0.4 % — ABNORMAL HIGH (ref 0.0–0.2)

## 2021-05-20 LAB — COMPREHENSIVE METABOLIC PANEL
ALT: 12 U/L (ref 0–44)
AST: 27 U/L (ref 15–41)
Albumin: 2.3 g/dL — ABNORMAL LOW (ref 3.5–5.0)
Alkaline Phosphatase: 58 U/L (ref 38–126)
Anion gap: 10 (ref 5–15)
BUN: 6 mg/dL — ABNORMAL LOW (ref 8–23)
CO2: 22 mmol/L (ref 22–32)
Calcium: 8.5 mg/dL — ABNORMAL LOW (ref 8.9–10.3)
Chloride: 106 mmol/L (ref 98–111)
Creatinine, Ser: 1.04 mg/dL (ref 0.61–1.24)
GFR, Estimated: >60 mL/min (ref >60)
Glucose, Bld: 90 mg/dL (ref 70–99)
Potassium: 3.6 mmol/L (ref 3.5–5.1)
Sodium: 138 mmol/L (ref 135–145)
Total Bilirubin: 0.7 mg/dL (ref 0.3–1.2)
Total Protein: 5.1 g/dL — ABNORMAL LOW (ref 6.5–8.1)

## 2021-05-20 LAB — LEVETIRACETAM LEVEL: Levetiracetam Lvl: 4 ug/mL — ABNORMAL LOW (ref 10.0–40.0)

## 2021-05-20 LAB — CK: Total CK: 908 U/L — ABNORMAL HIGH (ref 49–397)

## 2021-05-20 LAB — VITAMIN B1: Vitamin B1 (Thiamine): 301.9 nmol/L — ABNORMAL HIGH (ref 66.5–200.0)

## 2021-05-20 MED ORDER — THIAMINE HCL 100 MG PO TABS
100.0000 mg | ORAL_TABLET | Freq: Every day | ORAL | Status: DC
  Administered 2021-05-20 – 2021-05-24 (×5): 100 mg via ORAL
  Filled 2021-05-20 (×5): qty 1

## 2021-05-20 MED ORDER — FOLIC ACID 1 MG PO TABS
1.0000 mg | ORAL_TABLET | Freq: Every day | ORAL | Status: DC
  Administered 2021-05-21 – 2021-05-24 (×4): 1 mg via ORAL
  Filled 2021-05-20 (×4): qty 1

## 2021-05-20 NOTE — Progress Notes (Addendum)
Neurology Progress Note  Subjective: No acute overnight events, no focal seizure activity noted.  Some noted improvement in alertness today.   Exam: Vitals:   05/20/21 0947 05/20/21 1058  BP: (!) 148/83   Pulse: 91 87  Resp: (!) 22 20  Temp: 98.6 F (37 C)   SpO2: 100% 99%   Gen: Sitting up in bed with eyes closed, in no acute distress Resp: non-labored breathing, no respiratory distress Abd: soft to palpation throughout, non-tender, non-distended  Neuro: Mental Status: Initially sitting up in bed with his eyes closed but opens eyes easily to voice. He is able to state his name and that he is in [facility]. He incorrectly states that he is 65 years old and that the year is 63. When asked the month he states "two thousand, two thousand, twenty two" when asked the month he states "it is the 63nd month".  Naming and repetition are intact. Speech is mildly dysarthric.  Patient follows simple commands.  No neglect is noted.  Cranial Nerves: PERRL, EOMI without gaze preference, nystagmus, or ptosis, visual fields are full, face is symmetric resting and smiling, facial sensation is intact and symmetric to light touch, hearing is intact to voice, tongue protrudes midline.  Motor: Bilateral upper extremities have increased tone right worse than left. RUE also has 1+ edema and extremely limited ROM and antigravity movement. He winces with manipulation of the right upper extremity. Left upper extremity ROM is also limited to a lesser degree than the RUE and has some antigravity movement.  Bilateral lower extremities are without antigravity movement but withdraw to noxious stimuli.  Sensory: Intact and symmetric to light touch throughout Gait: Deferred for patient safety  Pertinent Labs: CBC    Component Value Date/Time   WBC 7.2 05/20/2021 0343   RBC 3.23 (L) 05/20/2021 0343   HGB 9.0 (L) 05/20/2021 0343   HGB 12.1 (L) 05/08/2018 1317   HCT 29.3 (L) 05/20/2021 0343   HCT 28.2  (L) 02/21/2019 0554   PLT 142 (L) 05/20/2021 0343   PLT 225 05/08/2018 1317   MCV 90.7 05/20/2021 0343   MCV 100 (H) 05/08/2018 1317   MCH 27.9 05/20/2021 0343   MCHC 30.7 05/20/2021 0343   RDW 18.8 (H) 05/20/2021 0343   RDW 17.5 (H) 05/08/2018 1317   LYMPHSABS 1.0 05/16/2021 2015   LYMPHSABS 1.7 05/08/2018 1317   MONOABS 1.3 (H) 05/16/2021 2015   EOSABS 0.0 05/16/2021 2015   EOSABS 0.2 05/08/2018 1317   BASOSABS 0.0 05/16/2021 2015   BASOSABS 0.0 05/08/2018 1317   CMP     Component Value Date/Time   NA 138 05/20/2021 0343   NA 145 (H) 05/08/2018 1317   K 3.6 05/20/2021 0343   CL 106 05/20/2021 0343   CO2 22 05/20/2021 0343   GLUCOSE 90 05/20/2021 0343   BUN 6 (L) 05/20/2021 0343   BUN 8 05/08/2018 1317   CREATININE 1.04 05/20/2021 0343   CALCIUM 8.5 (L) 05/20/2021 0343   PROT 5.1 (L) 05/20/2021 0343   PROT 6.8 05/08/2018 1317   ALBUMIN 2.3 (L) 05/20/2021 0343   ALBUMIN 4.2 05/08/2018 1317   AST 27 05/20/2021 0343   ALT 12 05/20/2021 0343   ALKPHOS 58 05/20/2021 0343   BILITOT 0.7 05/20/2021 0343   BILITOT 0.5 05/08/2018 1317   GFRNONAA >60 05/20/2021 0343   GFRAA >60 06/03/2019 0514   Overnight EEG 05/19/2021 - 05/20/2021: "This study showed lateralized periodic discharges with overriding fast activity in left hemisphere which are  on the ictal-interictal continuum with high potential for seizure recurrence.  There is also moderate to severe diffuse encephalopathy, nonspecific to etiology but could be secondary to seizures. No seizures were seen during this study."  Imaging Reviewed: No new imaging.   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 Electrographically continues to have lateralized. Discharges present although no seizures were seen-these pattern sometimes have high propensity for recurrence.  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  The right pseudoaneurysm is likely chronic in appearance while the left 1 demonstrated intimal irregularity suggesting intraluminal thrombus related to an acute component.  Recommendations: - Discontinue LTM EEG - Continue to monitor, patient is not fully at baseline - Continue current antiepileptic therapy - Continue seizure precautions - Ativan 2 mg IV for any seizure lasting > 5 minutes and notify neurology   From the stroke perspective Would touch base with vascular surgery and appreciate consult for the dissecting pseudoaneurysms in the cervical ICAs.  Anibal Henderson, AGACNP-BC [facility] [phone]   Attending Neurohospitalist Addendum Patient seen and examined with APP/Resident. Agree with the history and physical as documented above. Agree with the plan as documented, which I helped formulate. I have independently reviewed the chart, obtained history, review of systems and examined the patient.I have personally reviewed pertinent head/neck/spine imaging (CT/MRI). D/W Dr Sloan Leiter Please feel free to call with any questions.  -- Amie Portland, MD Neurologist [facility] Pager: [phone]

## 2021-05-20 NOTE — TOC Progression Note (Signed)
Transition of Care [facility]) - Progression Note    Patient Details  Name: Seth [name] MRN: [id number] Date of Birth: 1956-03-17  Transition of [facility]) CM/SW [place], [place] Phone Number: 05/20/2021, 12:25 PM  Clinical Narrative:    CSW followed up with [facility] as pt has been there before and daughter stated she would be ok with him returning. [facility] is able to offer a bed. Miquel Dunn states they will not have a bed available for the foreseeable future. CSW attempted to provide bed offers to daughter, voicemail was left. TOC will continue to follow for DC needs.   Expected Discharge Plan: [place] Barriers to Discharge: Continued Medical Work up  Expected Discharge Plan and Services Expected Discharge Plan: [place] In-house Referral: Clinical Social Work Discharge Planning Services: CM Consult Post Acute Care Choice: [place] arrangements for the past 2 months: [place] Determinants of Health (SDOH) Interventions    Readmission Risk Interventions Readmission Risk Prevention Plan 06/04/2019  Transportation Screening Complete  PCP or Specialist Appt within 5-7 Days Complete  Home Care Screening Complete  Medication Review (RN CM) Complete  Some recent data might be hidden
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
## 2021-05-20 NOTE — Procedures (Addendum)
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 EEG to evaluate for seizure.    Level of alertness: Lethargic    AEDs during EEG study: PB, LCM, LEV   Technical aspects: This EEG study was done with scalp electrodes positioned according to the 10-20 International system of electrode placement. Electrical activity was acquired at a sampling rate of 500Hz  and reviewed with a high frequency filter of 70Hz  and a low frequency filter of 1Hz . EEG data were recorded continuously and digitally stored.    Description: No clear posterior dominant rhythm was seen. EEG showed continuous generalized 3 to 5 Hz theta- delta slowing.  Lateralized periodic discharges with overriding fast activity were noted in left hemisphere at  0.5-1 Hz which at times appeared rhythmic without evolution.   ABNORMALITY - Lateralized periodic discharges with overriding fast activity ( LPD +), left hemisphere - Continuous slow, generalized   IMPRESSION: This study showed lateralized periodic discharges with overriding fast activity in left hemisphere which are on the ictal-interictal continuum with high potential for seizure recurrence.  There is also moderate to severe diffuse encephalopathy, nonspecific to etiology but could be secondary to seizures. No seizures were seen during this study.   Zarius Furr [name]

## 2021-05-20 NOTE — Progress Notes (Signed)
PROGRESS NOTE        PATIENT DETAILS Name: LESTAT GOLOB Age: 65 y.o. Sex: male Date of Birth: 1955/12/24 Admit Date: 05/16/2021 Admitting Physician Evalee Mutton Kristeen Mans, MD QAS:TMHDQ, Myra Rude, MD  Brief Narrative: Patient is a 65 y.o. male with history of seizure disorder, SDH (5/22-likely traumatic), prior history of CVA with residual mild right-sided weakness, EtOH use, nonadherent to medications, s/p left foot osteomyelitis-s/p TMA-presented with confusion and right-sided weakness-thought to have breakthrough seizures and Todd's paresis.  See below for further details.  Subjective: More awake and alert-seems to move moving his right upper extremity a bit more today compared to yesterday.  Objective: Vitals: Blood pressure (!) 148/83, pulse 87, temperature 98.6 F (37 C), temperature source Oral, resp. rate 20, height 6\' 3"  (1.905 m), weight 72.3 kg, SpO2 99 %.   Exam: Gen Exam:Alert awake-not in any distress HEENT:atraumatic, normocephalic Chest: B/L clear to auscultation anteriorly CVS:S1S2 regular Abdomen:soft non tender, non distended Extremities:no edema Neurology: Moving his right upper extremity more than yesterday Skin: no rash   Pertinent Labs/Radiology: Na: 138 K: 4.2 Creatinine: 1.20 Mg: 1.9 CK: 908  9/19>> COVID PCR: Negative 9/19>> UA: No significant amount of WBC. 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 9/21>> LDL:39  LTM EEG: Interstitial lateralized periodic discharges in the left hemisphere on the ictal/-interictal continuum   Assessment/Plan: Focal status epilepticus: No further twitching observed in RUE/RLE-in fact moving his RUE much more today compared to yesterday.  Remains  on Keppra/phenobarbital/Vimpat-LTM EEG as above-awaiting further recommendations from neurology.    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  Will await further recommendations from neurology.  EtOH use: Awake/alert-no signs of withdrawal-continue CIWA protocol-completed 3 days of high-dose thiamine- on thiamine 100 mg p.o. daily.    Hypomagnesemia: Repleted.  Recheck periodically.  Fever: Afebrile for the past several days-no sources of infection apparent.  Monitor off antimicrobial therapy.  Culture negative so far.  Rhabdomyolysis: Likely due to seizures-CK levels trending down.  HTN: BP slightly on the higher side-allow some amount of permissive hypertension.    HLD: Do not hold given rhabdomyolysis.  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  8 mm right upper lobe lung nodule: Radiology recommending repeat CT chest in 6-12 months.  Procedures: None Consults: None DVT Prophylaxis: Lovenox Code Status:Full code  Family Communication: Daughter-[name]-[phone]-updated over the phone on 9/23  Time spent: 25 minutes-Greater than 50% of this time was spent in counseling, explanation of diagnosis, planning of further management, and coordination of  care.  Diet: Diet Order             DIET DYS 3 Room  service appropriate? Yes; Fluid consistency: Thin  Diet effective now                      Disposition Plan: Status is: Inpatient  The patient will require care spanning > 2 midnights and should be moved to inpatient because: Inpatient level of care appropriate due to severity of illness  Dispo: The patient is from: Home              Anticipated d/c is to: Home              Patient currently is not medically stable to d/c.   Difficult to place patient No    Barriers to Discharge: Breakthrough seizures-acute CVA-work-up in progress-noted at baseline-not yet stable for discharge  Antimicrobial agents: Anti-infectives (From admission, onward)    None        MEDICATIONS: Scheduled Meds:  aspirin  81 mg Oral Daily   clopidogrel  300 mg Oral Once   Followed by   clopidogrel  75 mg Oral Daily   cyanocobalamin  1,000 mcg Subcutaneous Daily   enoxaparin (LOVENOX) injection  40 mg Subcutaneous QHS   fluticasone furoate-vilanterol  1 puff Inhalation Daily   folic acid  1 mg Intravenous Daily   multivitamin with minerals  1 tablet Oral Daily   pantoprazole  40 mg Oral Daily   PHENObarbital  65 mg Intravenous BID   umeclidinium bromide  1 puff Inhalation Daily   Continuous Infusions:  lacosamide (VIMPAT) IV 100 mg (05/20/21 1025)   levETIRAcetam 1,000 mg (05/20/21 0944)   PRN Meds:.acetaminophen **OR** acetaminophen, midazolam, midazolam, ondansetron **OR** ondansetron (ZOFRAN) IV   I have personally reviewed following labs and imaging studies  LABORATORY DATA: CBC: Recent Labs  Lab 05/16/21 2015 05/16/21 2019 05/17/21 0528 05/19/21 0345 05/20/21 0343  WBC 12.2*  --  7.8 10.2 7.2  NEUTROABS 9.8*  --   --   --   --   HGB 13.2 14.6 10.7* 10.8* 9.0*  HCT 41.2 43.0 33.9* 34.2* 29.3*  MCV 89.2  --  91.4 91.2 90.7  PLT 173  --  150 117* 142*     Basic Metabolic Panel: Recent Labs  Lab 05/16/21 2015  05/16/21 2019 05/17/21 0528 05/18/21 0607 05/19/21 0345 05/20/21 0343  NA 136 134* 136 136 138 138  K 4.9 4.5 4.6 3.6 4.2 3.6  CL 102 104 102 104 104 106  CO2 17*  --  21* 20* 18* 22  GLUCOSE 92 96 101* 82 73 90  BUN 10 10 9  5* 6* 6*  CREATININE 1.40* 1.30* 1.26* 1.02 1.20 1.04  CALCIUM 9.5  --  8.8* 8.7* 8.6* 8.5*  MG  --   --  2.1 1.6* 1.9  --   PHOS  --   --  3.2 3.3  --   --      GFR: Estimated Creatinine Clearance: 74.3 mL/min (by C-G formula based on SCr of 1.04 mg/dL).  Liver Function Tests: Recent Labs  Lab 05/16/21 2015 05/18/21 0607 05/19/21 0345 05/20/21 0343  AST 67* 63* 42* 27  ALT 18 18 16 12   ALKPHOS 105 70 67 58  BILITOT 1.1 1.3* 1.4* 0.7  PROT 7.5 5.6* 5.7* 5.1*  ALBUMIN 4.0 2.8* 2.7* 2.3*    No results for input(s): LIPASE, AMYLASE in the last 168 hours. No results for input(s): AMMONIA in the last 168 hours.  Coagulation Profile: Recent Labs  Lab 05/16/21 2028  INR 1.1  Cardiac Enzymes: Recent Labs  Lab 05/17/21 0528 05/18/21 0607 05/20/21 0343  CKTOTAL 5,214* 3,559* 908*     BNP (last 3 results) No results for input(s): PROBNP in the last 8760 hours.  Lipid Profile: Recent Labs    05/18/21 0607  CHOL 152  HDL 97  LDLCALC 39  TRIG 81  CHOLHDL 1.6     Thyroid Function Tests: No results for input(s): TSH, T4TOTAL, FREET4, T3FREE, THYROIDAB in the last 72 hours.  Anemia Panel: No results for input(s): VITAMINB12, FOLATE, FERRITIN, TIBC, IRON, RETICCTPCT in the last 72 hours.  Urine analysis:    Component Value Date/Time   COLORURINE YELLOW 05/18/2021 0609   APPEARANCEUR CLEAR 05/18/2021 0609   LABSPEC 1.025 05/18/2021 0609   PHURINE 6.0 05/18/2021 0609   GLUCOSEU NEGATIVE 05/18/2021 0609   HGBUR NEGATIVE 05/18/2021 0609   BILIRUBINUR NEGATIVE 05/18/2021 0609   KETONESUR NEGATIVE 05/18/2021 0609   PROTEINUR NEGATIVE 05/18/2021 0609   NITRITE NEGATIVE 05/18/2021 0609   LEUKOCYTESUR NEGATIVE 05/18/2021 0609     Sepsis Labs: Lactic Acid, Venous    Component Value Date/Time   LATICACIDVEN 1.2 01/05/2021 0010    MICROBIOLOGY: Recent Results (from the past 240 hour(s))  Resp Panel by RT-PCR (Flu A&B, Covid) Nasopharyngeal Swab     Status: None   Collection Time: 05/16/21  9:20 PM   Specimen: Nasopharyngeal Swab; Nasopharyngeal(NP) swabs in vial transport medium  Result Value Ref Range Status   SARS Coronavirus 2 by RT PCR NEGATIVE NEGATIVE Final    Comment: (NOTE) SARS-CoV-2 target nucleic acids are NOT DETECTED.  The SARS-CoV-2 RNA is generally detectable in upper respiratory specimens during the acute phase of infection. The lowest concentration of SARS-CoV-2 viral copies this assay can detect is 138 copies/mL. A negative result does not preclude SARS-Cov-2 infection and should not be used as the sole basis for treatment or other patient management decisions. A negative result may occur with  improper specimen collection/handling, submission of specimen other than nasopharyngeal swab, presence of viral mutation(s) within the areas targeted by this assay, and inadequate number of viral copies(<138 copies/mL). A negative result must be combined with clinical observations, patient history, and epidemiological information. The expected result is Negative.  Fact Sheet for Patients:  EntrepreneurPulse.com.au  Fact Sheet for Healthcare Providers:  IncredibleEmployment.be  This test is no t yet approved or cleared by the [place] FDA and  has been authorized for detection and/or diagnosis of SARS-CoV-2 by FDA under an Emergency Use Authorization (EUA). This EUA will remain  in effect (meaning this test can be used) for the duration of the COVID-19 declaration under Section 564(b)(1) of the Act, 21 U.S.C.section 360bbb-3(b)(1), unless the authorization is terminated  or revoked sooner.       Influenza A by PCR NEGATIVE NEGATIVE Final    Influenza B by PCR NEGATIVE NEGATIVE Final    Comment: (NOTE) The Xpert Xpress SARS-CoV-2/FLU/RSV plus assay is intended as an aid in the diagnosis of influenza from Nasopharyngeal swab specimens and should not be used as a sole basis for treatment. Nasal washings and aspirates are unacceptable for Xpert Xpress SARS-CoV-2/FLU/RSV testing.  Fact Sheet for Patients: EntrepreneurPulse.com.au  Fact Sheet for Healthcare Providers: IncredibleEmployment.be  This test is not yet approved or cleared by the [place] FDA and has been authorized for detection and/or diagnosis of SARS-CoV-2 by FDA under an Emergency Use Authorization (EUA). This EUA will remain in effect (meaning this test can be used) for the duration of the COVID-19 declaration  under Section 564(b)(1) of the Act, 21 U.S.C. section 360bbb-3(b)(1), unless the authorization is terminated or revoked.  Performed at [facility], [place] [address] [place], [place] [address]   Culture, blood (routine x 2)     Status: None (Preliminary result)   Collection Time: 05/17/21  5:20 AM   Specimen: BLOOD  Result Value Ref Range Status   Specimen Description BLOOD SITE NOT SPECIFIED  Final   Special Requests   Final    BOTTLES DRAWN AEROBIC AND ANAEROBIC Blood Culture results may not be optimal due to an inadequate volume of blood received in culture bottles   Culture   Final    NO GROWTH 2 DAYS Performed at [facility], [place] [address] [place], [place] [address]    Report Status PENDING  Incomplete  Culture, blood (routine x 2)     Status: None (Preliminary result)   Collection Time: 05/17/21  5:20 AM   Specimen: BLOOD  Result Value Ref Range Status   Specimen Description BLOOD SITE NOT SPECIFIED  Final   Special Requests   Final    BOTTLES DRAWN AEROBIC AND ANAEROBIC Blood Culture results may not be optimal due to an inadequate volume of blood received in culture bottles   Culture    Final    NO GROWTH 2 DAYS Performed at [facility], [place] [address] [place], [place] [address]    Report Status PENDING  Incomplete    RADIOLOGY STUDIES/RESULTS: EEG adult  Result Date: 05/18/2021 Derek Jack, MD     05/18/2021 12:28 PM Routine EEG Repot DEWIGHT CATINO is a 65 y.o. male with a history of seizures who is undergoing an EEG to evaluate for seizures. Report: This EEG was acquired with electrodes placed according to the International 10-20 electrode system (including Fp1, Fp2, F3, F4, C3, C4, P3, P4, O1, O2, T3, T4, T5, T6, A1, A2, Fz, Cz, Pz). The following electrodes were missing or displaced: none. There was no clear occipital dominant rhythm. The best background was 6-8 Hz with overlying beta frequencies. This activity is reactive to stimulation. Drowsiness was manifested by background fragmentation; deeper stages of sleep were not identified. There was no focal slowing. There were intermittent sharp waves over the left hemisphere that at times briefly become rhythmic up to 1 Hz. There were no electrographic seizures identified. Photic stimulation and hyperventilation were not performed. Impression: This EEG was obtained while sedated on midazolam and phenobarbital and drowsy and is abnormal due to: - Mild diffuse slowing indicating global cerebral dysfunction - Sharp waves over the left hemisphere indicating increased epileptogenicity in this region Beta frequencies are secondary to medication effect from benzodiazepines. There were no electrographic seizures seen in this recording. Su Monks, MD [facility] [phone] If 7pm- 7am, please page neurology on call as listed in [place].   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 EEG to evaluate for seizure. Level of alertness: Lethargic AEDs during EEG study: PB, LCM, LEV, Versed Technical aspects: This EEG study was done with scalp electrodes positioned according to the 10-20 International system of electrode placement. Electrical activity was  acquired at a sampling rate of 500Hz  and reviewed with a high frequency filter of 70Hz  and a low frequency filter of 1Hz . EEG data were recorded continuously and digitally stored. Description: No clear posterior dominant rhythm was seen. EEG showed continuous generalized 3 to 5 Hz theta- delta slowing.  Lateralized periodic discharges with overriding fast activity were noted in left hemisphere at 1 Hz which at times appeared rhythmic without evolution.  ABNORMALITY - Lateralized periodic discharges with overriding fast activity ( LPD +), left hemisphere - Continuous slow, generalized  IMPRESSION: This study showed lateralized periodic discharges with overriding fast activity in left hemisphere which are on the ictal-interictal continuum with high potential for seizure recurrence.  There is also moderate to severe diffuse encephalopathy, nonspecific to etiology but could be secondary to seizures. [place]     LOS: 3 days   Oren Binet, MD  [facility]    To contact the attending provider between 7A-7P or the covering provider during after hours 7P-7A, please log into the web site [email or web address] and access using universal [place] password for that web site. If you do not have the password, please call the hospital operator.  05/20/2021, 11:21 AM

## 2021-05-20 NOTE — Care Management Important Message (Signed)
Important Message  Patient Details  Name: Seth [name] MRN: [id number] Date of Birth: 05-18-1956   Medicare Important Message Given:  Yes     Cathie Bonnell [address] 05/20/2021, 3:43 PM

## 2021-05-20 NOTE — Progress Notes (Addendum)
Speech Language Pathology Treatment: Dysphagia  Patient Details Name: Seth [name] MRN: [id number] DOB: 05/13/1956 Today's Date: 05/20/2021 Time: 1154-1205 SLP Time Calculation (min) (ACUTE ONLY): 11 min  Assessment / Plan / Recommendation Clinical Impression  Pt seen for follow-up dysphagia treatment with care team member present monitoring EEG. Pt self-fed consecutive straw sips of thin liquids, across x3 trials, demonstrating no overt s/sx of aspiration. Amount consumed during initial trial was likely close to 3oz. Bites of dysphagia 3 and regular textured solids resulted in prolonged mastication, especially with regular solid. Pt achieved complete oral clearance of solids with occasional verbal cues for liquid wash to assist. Delayed cough x1 exhibited post intake with quick recovery. RN reports pt doing well with meals and med intake without reports of s/sx of aspiration. Pt has met all goals for SLP POC at this time. Dysphagia 3, thin liquid diet remains appropriate given edentulous status. Will s/o.    HPI HPI: Patient is a 65 y.o. male with history of seizure disorder, admitted with seizures.  Pt withPMH + for SDH (5/22-likely traumatic) EtOH use, nonadherent to medications, s/p left foot osteomyelitis-s/p TMA-presented with confusion and right-sided weakness-thought to have breakthrough seizures and Todd's paresis. Found to have Small acute infarct in the left frontal lobe inferolaterally. Chest xray (9/19) revealed no active disease. Pt with a hx of dysphagia MBS 5/22 " Given equal likelihood of trace aspiraiton with both thin and thick liquids as well as adequate sensation of significant volume of aspiration with ejection, recommend this pt resume a regular diet with thin liquids. Pt will be observed to cough at times, but should be allowed to feed himself with occasional cues to slow rate. In testing pt had similar function with large consecutive self fed straw sips and cup sips as he did  with total assist controlled sips. "  Imaging showed Small acute infarct in the left frontal lobe inferolaterally.      SLP Plan  All goals met;Discharge SLP treatment due to (comment)      Recommendations for follow up therapy are one component of a multi-disciplinary discharge planning process, led by the attending physician.  Recommendations may be updated based on patient status, additional functional criteria and insurance authorization.    Recommendations  Diet recommendations: Dysphagia 3 (mechanical soft);Thin liquid Liquids provided via: Straw;Cup Medication Administration: Whole meds with puree Supervision: Staff to assist with self feeding;Full supervision/cueing for compensatory strategies Compensations: Minimize environmental distractions;Slow rate;Small sips/bites;Follow solids with liquid Postural Changes and/or Swallow Maneuvers: Seated upright 90 degrees                Oral Care Recommendations: Oral care BID;Staff/trained caregiver to provide oral care Follow up Recommendations: None SLP Visit Diagnosis: Dysphagia, unspecified (R13.10) Plan: All goals met;Discharge SLP treatment due to (comment)       GO                , MA, CCC-SLP Acute Rehabilitation Services Office Number: [phone]   G   05/20/2021, 12:16 PM 

## 2021-05-21 DIAGNOSIS — I6330 Cerebral infarction due to thrombosis of unspecified cerebral artery: Secondary | ICD-10-CM | POA: Diagnosis not present

## 2021-05-21 DIAGNOSIS — R509 Fever, unspecified: Secondary | ICD-10-CM | POA: Diagnosis not present

## 2021-05-21 DIAGNOSIS — F1020 Alcohol dependence, uncomplicated: Secondary | ICD-10-CM | POA: Diagnosis not present

## 2021-05-21 DIAGNOSIS — R569 Unspecified convulsions: Secondary | ICD-10-CM | POA: Diagnosis not present

## 2021-05-21 LAB — BASIC METABOLIC PANEL
Anion gap: 11 (ref 5–15)
BUN: 6 mg/dL — ABNORMAL LOW (ref 8–23)
CO2: 20 mmol/L — ABNORMAL LOW (ref 22–32)
Calcium: 8.4 mg/dL — ABNORMAL LOW (ref 8.9–10.3)
Chloride: 105 mmol/L (ref 98–111)
Creatinine, Ser: 0.95 mg/dL (ref 0.61–1.24)
GFR, Estimated: >60 mL/min (ref >60)
Glucose, Bld: 87 mg/dL (ref 70–99)
Potassium: 3.4 mmol/L — ABNORMAL LOW (ref 3.5–5.1)
Sodium: 136 mmol/L (ref 135–145)

## 2021-05-21 LAB — MAGNESIUM: Magnesium: 1.5 mg/dL — ABNORMAL LOW (ref 1.7–2.4)

## 2021-05-21 LAB — CK: Total CK: 545 U/L — ABNORMAL HIGH (ref 49–397)

## 2021-05-21 MED ORDER — POTASSIUM CHLORIDE CRYS ER 20 MEQ PO TBCR
40.0000 meq | EXTENDED_RELEASE_TABLET | Freq: Once | ORAL | Status: AC
  Administered 2021-05-21: 40 meq via ORAL
  Filled 2021-05-21: qty 2

## 2021-05-21 MED ORDER — MAGNESIUM SULFATE 4 GM/100ML IV SOLN
4.0000 g | Freq: Once | INTRAVENOUS | Status: AC
  Administered 2021-05-21: 4 g via INTRAVENOUS
  Filled 2021-05-21: qty 100

## 2021-05-21 NOTE — Progress Notes (Signed)
PROGRESS NOTE        PATIENT DETAILS Name: Seth [name] Age: 65 y.o. Sex: male Date of Birth: Jul 15, 1956 Admit Date: 05/16/2021 Admitting Physician Evalee Mutton Kristeen Mans, MD RFF:[id number], Myra Rude, MD  Brief Narrative: Patient is a 65 y.o. male with history of seizure disorder, SDH (5/22-likely traumatic), prior history of CVA with residual mild right-sided weakness, EtOH use, nonadherent to medications, s/p left foot osteomyelitis-s/p TMA-presented with confusion/twitching movement to the right upper/lower extremity and worsening of his chronic right-sided weakness.  He was thought to have breakthrough seizures and Todd's paresis.  He was subsequently admitted to the hospitalist service.  See below for further details.    Subjective: Awake/alert-claims that he feels much better-and that his right-sided weakness is back to his usual baseline.  Objective: Vitals: Blood pressure 135/75, pulse 92, temperature (!) 100.5 F (38.1 C), temperature source Oral, resp. rate 16, height 6\' 3"  (1.905 m), weight 72.3 kg, SpO2 100 %.   Exam: Gen Exam:Alert awake-not in any distress HEENT:atraumatic, normocephalic Chest: B/L clear to auscultation anteriorly CVS:S1S2 regular Abdomen:soft non tender, non distended Extremities:no edema Neurology: Chronic right-sided hemiparesis (per patient back to baseline) Skin: no rash   Pertinent Labs/Radiology: Na: 136 K: 3.4 Creatinine: 0.95 Mg: 1.5 CK: 545  9/19>> COVID PCR: Negative 9/19>> UA: No significant amount of WBC. 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 9/21>> LDL:39  LTM EEG: Interstitial lateralized periodic discharges in the left hemisphere on the ictal/-interictal  continuum   Assessment/Plan: Focal status epilepticus: No further twitching observed in RUE/RLE-LTM EEG negative for any seizure activity.  Right-sided hemiparesis is improved and back to baseline.  Neurology recommending to continue Keppra/phenobarb and Vimpat on discharge.   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  Resume statins once CK trends down further.  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 Oda Kilts are to continue supportive care with antiplatelet agents and follow in the outpatient setting.   EtOH use: Awake/alert-no signs of withdrawal-continue CIWA protocol-completed 3 days of high-dose thiamine- on thiamine 100 mg p.o. daily.    Hypomagnesemia: Replete and recheck  Hypokalemia: Replete and recheck  Fever: Afebrile for the past several days-no sources of infection apparent.  Monitor off antimicrobial therapy.  Culture negative so far.  Rhabdomyolysis: Likely due to seizures-CK levels trending down.  HTN: BP slightly on the higher side-allow some amount of permissive hypertension.    HLD: Do not hold given rhabdomyolysis.  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  8 mm right upper lobe lung nodule: Radiology recommending repeat CT chest in 6-12 months.  Procedures: None Consults: Neurology, vascular surgery DVT  Prophylaxis: Lovenox Code Status:Full code  Family Communication: Daughter-[name]-updated over the phone on 9/23  Time spent: 25 minutes-Greater than 50% of this time was spent in counseling, explanation of diagnosis, planning of further management, and coordination of care.  Diet: Diet Order             DIET DYS 3 Room service appropriate? Yes; Fluid consistency: Thin  Diet effective now                      Disposition Plan: Status is: Inpatient  The patient will require care spanning > 2 midnights and should be moved to inpatient because: Inpatient level of care appropriate due to severity of illness  Dispo: The patient is from: Home              Anticipated d/c is to: Home              Patient currently is not medically stable to d/c.   Difficult to place patient No    Barriers to Discharge: Breakthrough seizures-acute CVA-work-up in progress-noted at baseline-not yet stable for discharge  Antimicrobial agents: Anti-infectives (From admission, onward)    None        MEDICATIONS: Scheduled Meds:  aspirin  81 mg Oral Daily   clopidogrel  300 mg Oral Once   Followed by   clopidogrel  75 mg Oral Daily   cyanocobalamin  1,000 mcg Subcutaneous Daily   enoxaparin (LOVENOX) injection  40 mg Subcutaneous QHS   fluticasone furoate-vilanterol  1 puff Inhalation Daily   folic acid  1 mg Oral Daily   multivitamin with minerals  1 tablet Oral Daily   pantoprazole  40 mg Oral Daily   PHENObarbital  65 mg Intravenous BID   thiamine  100 mg Oral Daily   umeclidinium bromide  1 puff Inhalation Daily   Continuous Infusions:  lacosamide (VIMPAT) IV 100 mg (05/21/21 0959)   levETIRAcetam 1,000 mg (05/21/21 0941)   PRN Meds:.acetaminophen **OR** acetaminophen, midazolam, midazolam, ondansetron **OR** ondansetron (ZOFRAN) IV   I have personally reviewed following labs and imaging studies  LABORATORY DATA: CBC: Recent Labs  Lab 05/16/21 2015  05/16/21 2019 05/17/21 0528 05/19/21 0345 05/20/21 0343  WBC 12.2*  --  7.8 10.2 7.2  NEUTROABS 9.8*  --   --   --   --   HGB 13.2 14.6 10.7* 10.8* 9.0*  HCT 41.2 43.0 33.9* 34.2* 29.3*  MCV 89.2  --  91.4 91.2 90.7  PLT 173  --  150 117* 142*     Basic Metabolic Panel: Recent Labs  Lab 05/17/21 0528 05/18/21 0607 05/19/21 0345 05/20/21 0343 05/21/21 0552  NA 136 136 138 138 136  K 4.6 3.6 4.2 3.6 3.4*  CL 102 104 104 106 105  CO2 21* 20* 18* 22 20*  GLUCOSE 101* 82 73 90 87  BUN 9 5* 6* 6* 6*  CREATININE 1.26* 1.02 1.20 1.04 0.95  CALCIUM 8.8* 8.7* 8.6* 8.5* 8.4*  MG 2.1 1.6* 1.9  --  1.5*  PHOS 3.2 3.3  --   --   --      GFR: Estimated Creatinine Clearance: 81.4 mL/min (by C-G formula based on SCr of 0.95 mg/dL).  Liver Function Tests: Recent Labs  Lab 05/16/21 2015 05/18/21 0607 05/19/21 0345 05/20/21 0343  AST 67* 63* 42* 27  ALT 18 18 16 12   ALKPHOS 105 70 67 58  BILITOT 1.1 1.3* 1.4* 0.7  PROT 7.5 5.6* 5.7* 5.1*  ALBUMIN 4.0 2.8* 2.7* 2.3*    No results for input(s): LIPASE,  AMYLASE in the last 168 hours. No results for input(s): AMMONIA in the last 168 hours.  Coagulation Profile: Recent Labs  Lab 05/16/21 2028  INR 1.1     Cardiac Enzymes: Recent Labs  Lab 05/17/21 0528 05/18/21 0607 05/20/21 0343 05/21/21 0552  CKTOTAL 5,214* 3,559* 908* 545*     BNP (last 3 results) No results for input(s): PROBNP in the last 8760 hours.  Lipid Profile: No results for input(s): CHOL, HDL, LDLCALC, TRIG, CHOLHDL, LDLDIRECT in the last 72 hours.   Thyroid Function Tests: No results for input(s): TSH, T4TOTAL, FREET4, T3FREE, THYROIDAB in the last 72 hours.  Anemia Panel: No results for input(s): VITAMINB12, FOLATE, FERRITIN, TIBC, IRON, RETICCTPCT in the last 72 hours.  Urine analysis:    Component Value Date/Time   COLORURINE YELLOW 05/18/2021 0609   APPEARANCEUR CLEAR 05/18/2021 0609   LABSPEC 1.025 05/18/2021 0609   PHURINE 6.0  05/18/2021 0609   GLUCOSEU NEGATIVE 05/18/2021 0609   HGBUR NEGATIVE 05/18/2021 0609   BILIRUBINUR NEGATIVE 05/18/2021 0609   KETONESUR NEGATIVE 05/18/2021 0609   PROTEINUR NEGATIVE 05/18/2021 0609   NITRITE NEGATIVE 05/18/2021 0609   LEUKOCYTESUR NEGATIVE 05/18/2021 0609    Sepsis Labs: Lactic Acid, Venous    Component Value Date/Time   LATICACIDVEN 1.2 01/05/2021 0010    MICROBIOLOGY: Recent Results (from the past 240 hour(s))  Resp Panel by RT-PCR (Flu A&B, Covid) Nasopharyngeal Swab     Status: None   Collection Time: 05/16/21  9:20 PM   Specimen: Nasopharyngeal Swab; Nasopharyngeal(NP) swabs in vial transport medium  Result Value Ref Range Status   SARS Coronavirus 2 by RT PCR NEGATIVE NEGATIVE Final    Comment: (NOTE) SARS-CoV-2 target nucleic acids are NOT DETECTED.  The SARS-CoV-2 RNA is generally detectable in upper respiratory specimens during the acute phase of infection. The lowest concentration of SARS-CoV-2 viral copies this assay can detect is 138 copies/mL. A negative result does not preclude SARS-Cov-2 infection and should not be used as the sole basis for treatment or other patient management decisions. A negative result may occur with  improper specimen collection/handling, submission of specimen other than nasopharyngeal swab, presence of viral mutation(s) within the areas targeted by this assay, and inadequate number of viral copies(<138 copies/mL). A negative result must be combined with clinical observations, patient history, and epidemiological information. The expected result is Negative.  Fact Sheet for Patients:  EntrepreneurPulse.com.au  Fact Sheet for Healthcare Providers:  IncredibleEmployment.be  This test is no t yet approved or cleared by the [place] FDA and  has been authorized for detection and/or diagnosis of SARS-CoV-2 by FDA under an Emergency Use Authorization (EUA). This EUA will remain   in effect (meaning this test can be used) for the duration of the COVID-19 declaration under Section 564(b)(1) of the Act, 21 U.S.C.section 360bbb-3(b)(1), unless the authorization is terminated  or revoked sooner.       Influenza A by PCR NEGATIVE NEGATIVE Final   Influenza B by PCR NEGATIVE NEGATIVE Final    Comment: (NOTE) The Xpert Xpress SARS-CoV-2/FLU/RSV plus assay is intended as an aid in the diagnosis of influenza from Nasopharyngeal swab specimens and should not be used as a sole basis for treatment. Nasal washings and aspirates are unacceptable for Xpert Xpress SARS-CoV-2/FLU/RSV testing.  Fact Sheet for Patients: EntrepreneurPulse.com.au  Fact Sheet for Healthcare Providers: IncredibleEmployment.be  This test is not yet approved or cleared by the [place] FDA and has been authorized for detection and/or diagnosis of SARS-CoV-2 by  FDA under an Emergency Use Authorization (EUA). This EUA will remain in effect (meaning this test can be used) for the duration of the COVID-19 declaration under Section 564(b)(1) of the Act, 21 U.S.C. section 360bbb-3(b)(1), unless the authorization is terminated or revoked.  Performed at [facility], [place] [address] [place], [place] [address]   Culture, blood (routine x 2)     Status: None (Preliminary result)   Collection Time: 05/17/21  5:20 AM   Specimen: BLOOD  Result Value Ref Range Status   Specimen Description BLOOD SITE NOT SPECIFIED  Final   Special Requests   Final    BOTTLES DRAWN AEROBIC AND ANAEROBIC Blood Culture results may not be optimal due to an inadequate volume of blood received in culture bottles   Culture   Final    NO GROWTH 4 DAYS Performed at [facility], [place] [address] [place], [place] [address]    Report Status PENDING  Incomplete  Culture, blood (routine x 2)     Status: None (Preliminary result)   Collection Time: 05/17/21  5:20 AM   Specimen:  BLOOD  Result Value Ref Range Status   Specimen Description BLOOD SITE NOT SPECIFIED  Final   Special Requests   Final    BOTTLES DRAWN AEROBIC AND ANAEROBIC Blood Culture results may not be optimal due to an inadequate volume of blood received in culture bottles   Culture   Final    NO GROWTH 4 DAYS Performed at [facility], [place] [address], [place], [place] [address]    Report Status PENDING  Incomplete    RADIOLOGY STUDIES/RESULTS: No results found.   LOS: 4 days   Oren Binet, MD  [facility]    To contact the attending provider between 7A-7P or the covering provider during after hours 7P-7A, please log into the web site [email or web address] and access using universal [place] password for that web site. If you do not have the password, please call the hospital operator.  05/21/2021, 2:54 PM

## 2021-05-21 NOTE — Progress Notes (Addendum)
Brief progress note Patient seen and examined this morning. Was awake alert oriented to self, Has right hemiparesis. Regarding his bilateral cervical ICA pseudoaneurysms, discussed with vascular surgery-appreciate Dr. Claretha Cooper consultation.  Also discussed with Dr. Kathyrn Sheriff He will need outpatient follow-up for reimaging with CTA head and neck-no emergent intervention indicated at this time. Continue aspirin and Plavix for stroke prevention Continue current antiepileptics No further inpatient neurological recommendations  -- Amie Portland, MD Neurologist [facility] Pager: [phone] No charge note

## 2021-05-21 NOTE — TOC Progression Note (Signed)
Transition of Care [facility]) - Progression Note    Patient Details  Name: Seth [name] MRN: [id number] Date of Birth: 1955/09/14  Transition of Care [facility]) CM/SW [place], [place] Phone Number: 05/21/2021, 12:51 PM  Clinical Narrative:    CSW spoke with dtr [name], she noted she would like for pt to return to [place]. Facility is happy to have pt back. CSW will start insurance authorization tomorrow. TOC will continue to follow.   Expected Discharge Plan: [place] Barriers to Discharge: Continued Medical Work up  Expected Discharge Plan and Services Expected Discharge Plan: [place] In-house Referral: Clinical Social Work Discharge Planning Services: CM Consult Post Acute Care Choice: [place] arrangements for the past 2 months: [place] Determinants of Health (SDOH) Interventions    Readmission Risk Interventions Readmission Risk Prevention Plan 06/04/2019  Transportation Screening Complete  PCP or Specialist Appt within 5-7 Days Complete  Home Care Screening Complete  Medication Review (RN CM) Complete  Some recent data might be hidden

## 2021-05-22 DIAGNOSIS — R569 Unspecified convulsions: Secondary | ICD-10-CM | POA: Diagnosis not present

## 2021-05-22 LAB — BASIC METABOLIC PANEL
Anion gap: 9 (ref 5–15)
BUN: 8 mg/dL (ref 8–23)
CO2: 21 mmol/L — ABNORMAL LOW (ref 22–32)
Calcium: 8.6 mg/dL — ABNORMAL LOW (ref 8.9–10.3)
Chloride: 104 mmol/L (ref 98–111)
Creatinine, Ser: 0.99 mg/dL (ref 0.61–1.24)
GFR, Estimated: >60 mL/min (ref >60)
Glucose, Bld: 84 mg/dL (ref 70–99)
Potassium: 4.4 mmol/L (ref 3.5–5.1)
Sodium: 134 mmol/L — ABNORMAL LOW (ref 135–145)

## 2021-05-22 LAB — CBC
HCT: 30.9 % — ABNORMAL LOW (ref 39.0–52.0)
Hemoglobin: 10.1 g/dL — ABNORMAL LOW (ref 13.0–17.0)
MCH: 29.1 pg (ref 26.0–34.0)
MCHC: 32.7 g/dL (ref 30.0–36.0)
MCV: 89 fL (ref 80.0–100.0)
Platelets: 205 10*3/uL (ref 150–400)
RBC: 3.47 MIL/uL — ABNORMAL LOW (ref 4.22–5.81)
RDW: 18.7 % — ABNORMAL HIGH (ref 11.5–15.5)
WBC: 7.2 10*3/uL (ref 4.0–10.5)
nRBC: 0 % (ref 0.0–0.2)

## 2021-05-22 LAB — CULTURE, BLOOD (ROUTINE X 2)
Culture: NO GROWTH
Culture: NO GROWTH

## 2021-05-22 LAB — MAGNESIUM: Magnesium: 2.3 mg/dL (ref 1.7–2.4)

## 2021-05-22 LAB — CK: Total CK: 401 U/L — ABNORMAL HIGH (ref 49–397)

## 2021-05-22 MED ORDER — PHENOBARBITAL 32.4 MG PO TABS
64.8000 mg | ORAL_TABLET | Freq: Two times a day (BID) | ORAL | Status: DC
  Administered 2021-05-22 – 2021-05-24 (×4): 64.8 mg via ORAL
  Filled 2021-05-22 (×4): qty 2

## 2021-05-22 MED ORDER — MIDAZOLAM HCL 2 MG/2ML IJ SOLN
2.0000 mg | Freq: Four times a day (QID) | INTRAMUSCULAR | Status: DC | PRN
Start: 2021-05-22 — End: 2021-05-24

## 2021-05-22 MED ORDER — LACOSAMIDE 50 MG PO TABS
100.0000 mg | ORAL_TABLET | Freq: Two times a day (BID) | ORAL | Status: DC
  Administered 2021-05-22 – 2021-05-24 (×4): 100 mg via ORAL
  Filled 2021-05-22 (×4): qty 2

## 2021-05-22 MED ORDER — PHENOBARBITAL 64.8 MG PO TABS: 64.8000 mg | ORAL_TABLET | Freq: Two times a day (BID) | ORAL | Status: DC

## 2021-05-22 MED ORDER — LEVETIRACETAM 500 MG PO TABS
1000.0000 mg | ORAL_TABLET | Freq: Two times a day (BID) | ORAL | Status: DC
  Administered 2021-05-22 – 2021-05-24 (×4): 1000 mg via ORAL
  Filled 2021-05-22 (×4): qty 2

## 2021-05-22 NOTE — Progress Notes (Signed)
During assessment when I asked pt if he knew why he was here at the hospital he said "yes I had a seizure and I don't think alcohol helped it." I said no alcohol probably caused it.

## 2021-05-22 NOTE — Plan of Care (Signed)
  Problem: Education: Goal: Knowledge of General Education information will improve Description: Including pain rating scale, medication(s)/side effects and non-pharmacologic comfort measures Outcome: Progressing   Problem: Health Behavior/Discharge Planning: Goal: Ability to manage health-related needs will improve Outcome: Progressing   Problem: Clinical Measurements: Goal: Will remain free from infection Outcome: Progressing Goal: Diagnostic test results will improve Outcome: Progressing Goal: Respiratory complications will improve Outcome: Progressing Goal: Cardiovascular complication will be avoided Outcome: Progressing   Problem: Activity: Goal: Risk for activity intolerance will decrease Outcome: Progressing   Problem: Nutrition: Goal: Adequate nutrition will be maintained Outcome: Progressing   Problem: Elimination: Goal: Will not experience complications related to bowel motility Outcome: Progressing Goal: Will not experience complications related to urinary retention Outcome: Progressing   Problem: Pain Managment: Goal: General experience of comfort will improve Outcome: Progressing   Problem: Safety: Goal: Ability to remain free from injury will improve Outcome: Progressing   Problem: Skin Integrity: Goal: Risk for impaired skin integrity will decrease Outcome: Progressing   Problem: Education: Goal: Expressions of having a comfortable level of knowledge regarding the disease process will increase Outcome: Progressing   Problem: Coping: Goal: Ability to adjust to condition or change in health will improve Outcome: Progressing Goal: Ability to identify appropriate support needs will improve Outcome: Progressing   Problem: Health Behavior/Discharge Planning: Goal: Compliance with prescribed medication regimen will improve Outcome: Progressing   Problem: Medication: Goal: Risk for medication side effects will decrease Outcome: Progressing    Problem: Clinical Measurements: Goal: Complications related to the disease process, condition or treatment will be avoided or minimized Outcome: Progressing Goal: Diagnostic test results will improve Outcome: Progressing   Problem: Safety: Goal: Verbalization of understanding the information provided will improve Outcome: Progressing   Problem: Self-Concept: Goal: Level of anxiety will decrease Outcome: Progressing Goal: Ability to verbalize feelings about condition will improve Outcome: Progressing

## 2021-05-22 NOTE — Progress Notes (Signed)
PROGRESS NOTE        PATIENT DETAILS Name: Seth [name] Age: 65 y.o. Sex: male Date of Birth: 1955/10/18 Admit Date: 05/16/2021 Admitting Physician Evalee Mutton Kristeen Mans, MD EHO:[id number], Myra Rude, MD  Brief Narrative: Patient is a 65 y.o. male with history of seizure disorder, SDH (5/22-likely traumatic), prior history of CVA with residual mild right-sided weakness, EtOH use, nonadherent to medications, s/p left foot osteomyelitis-s/p TMA-presented with confusion/twitching movement to the right upper/lower extremity and worsening of his chronic right-sided weakness.  He was thought to have breakthrough seizures and Todd's paresis.  He was subsequently admitted to the hospitalist service.  See below for further details.    Subjective:  Patient in bed, appears comfortable, denies any headache, no fever, no chest pain or pressure, no shortness of breath , no abdominal pain. No new focal weakness.   Objective: Vitals: Blood pressure 137/81, pulse 90, temperature 98.3 F (36.8 C), temperature source Oral, resp. rate 18, height 6\' 3"  (1.905 m), weight 72.3 kg, SpO2 99 %.   Exam:  Awake Alert, No new F.N deficits, Chr . R sided weakness [place].AT,PERRAL Supple Neck,No JVD, No cervical lymphadenopathy appriciated.  Symmetrical Chest wall movement, Good air movement bilaterally, CTAB RRR,No Gallops, Rubs or new Murmurs, No Parasternal Heave +ve B.Sounds, Abd Soft, No tenderness, No organomegaly appriciated, No rebound - guarding or rigidity. No Cyanosis, Clubbing or edema, No new Rash or bruise   Pertinent Labs/Radiology: Na: 136 K: 3.4 Creatinine: 0.95 Mg: 1.5 CK: 545  9/19>> COVID PCR: Negative 9/19>> UA: No significant amount of WBC. 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 9/21>> LDL:39  LTM EEG: Interstitial lateralized periodic discharges in the left hemisphere on the ictal/-interictal continuum    Assessment/Plan:  Focal status epilepticus: No further twitching observed in RUE/RLE-LTM EEG negative for any seizure activity.  Right-sided hemiparesis is improved and back to baseline.  Neurology recommending to continue Keppra/phenobarb and Vimpat on discharge.   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  Resume statins once CK trends down further.  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 Oda Kilts are to continue supportive care with antiplatelet agents and follow in the outpatient setting.   EtOH use: Awake/alert-no signs of withdrawal-continue CIWA protocol-completed 3 days of high-dose thiamine- on thiamine 100 mg p.o. daily.    Hypomagnesemia: Replete and recheck  Hypokalemia: Replete and recheck  Fever: Afebrile for the past several days-no sources of infection apparent.  Monitor off antimicrobial therapy.  Culture negative so far.  Rhabdomyolysis: Likely due to seizures-CK levels trending down.  HTN: BP slightly on the higher side-allow some amount of permissive hypertension.    HLD: Do not hold given rhabdomyolysis.  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  8 mm right upper lobe lung nodule: Radiology recommending repeat CT chest in 6-12 months.  Procedures: None Consults: Neurology, vascular surgery DVT Prophylaxis: Lovenox Code Status:Full code  Family Communication: Daughter-[name]-[phone]-updated over the phone on 9/23  Time spent: 25 minutes-Greater than 50% of this time was spent in counseling, explanation of diagnosis, planning of further management, and coordination of care.  Diet: Diet Order             DIET DYS 3 Room service appropriate? Yes; Fluid consistency: Thin  Diet effective now                      Disposition Plan: Status is: Inpatient  The patient will require care spanning > 2 midnights and should be moved to inpatient because: Inpatient level of care appropriate due to severity of illness  Dispo: The patient is from: Home              Anticipated d/c is to: Home              Patient currently is not medically stable to d/c.   Difficult to place patient No    Barriers to Discharge: Breakthrough seizures-acute CVA-work-up in progress-noted at baseline-not yet stable for discharge  Antimicrobial agents: Anti-infectives (From admission, onward)    None        MEDICATIONS: Scheduled Meds:  aspirin  81 mg Oral Daily   cyanocobalamin  1,000 mcg Subcutaneous Daily   enoxaparin (LOVENOX) injection  40 mg Subcutaneous QHS   fluticasone furoate-vilanterol  1 puff Inhalation Daily   folic acid  1 mg Oral Daily   lacosamide  100 mg Oral BID   levETIRAcetam  1,000 mg Oral BID   multivitamin with minerals  1 tablet Oral Daily   pantoprazole  40 mg Oral Daily   PHENobarbital  64.8 mg Oral BID   thiamine  100 mg Oral Daily   umeclidinium bromide  1 puff Inhalation Daily   Continuous Infusions:  lacosamide (VIMPAT) IV 100 mg (05/22/21 1040)   PRN Meds:.acetaminophen **OR** [DISCONTINUED] acetaminophen, midazolam, [DISCONTINUED] ondansetron **OR**  ondansetron (ZOFRAN) IV   I have personally reviewed following labs and imaging studies  LABORATORY DATA:  Recent Labs  Lab 05/16/21 2015 05/16/21 2019 05/17/21 0528 05/19/21 0345 05/20/21 0343 05/22/21 0521  WBC 12.2*  --  7.8 10.2 7.2 7.2  HGB 13.2 14.6 10.7* 10.8* 9.0* 10.1*  HCT 41.2 43.0 33.9* 34.2* 29.3* 30.9*  PLT 173  --  150 117* 142* 205  MCV 89.2  --  91.4 91.2 90.7 89.0  MCH 28.6  --  28.8 28.8 27.9 29.1  MCHC 32.0  --  31.6 31.6 30.7 32.7  RDW 19.3*  --  19.8* 19.3* 18.8* 18.7*  LYMPHSABS 1.0  --   --   --   --   --   MONOABS 1.3*  --   --   --   --   --   EOSABS 0.0  --   --   --   --   --   BASOSABS 0.0  --   --   --   --   --     Recent Labs  Lab 05/16/21 2015 05/16/21 2019 05/16/21 2028 05/17/21 0528 05/18/21 4098 05/19/21 0345 05/20/21 0343 05/21/21 0552 05/22/21 0521  NA 136   < >  --  136 136 138 138 136 134*  K 4.9   < >  --  4.6 3.6 4.2 3.6 3.4* 4.4  CL 102   < >  --  102 104 104 106 105 104  CO2 17*  --   --  21* 20* 18* 22 20* 21*  GLUCOSE 92   < >  --  101* 82 73 90 87 84  BUN 10   < >  --  9 5* 6* 6* 6* 8  CREATININE 1.40*   < >  --  1.26* 1.02 1.20 1.04 0.95 0.99  CALCIUM 9.5  --   --  8.8* 8.7* 8.6* 8.5* 8.4* 8.6*  AST 67*  --   --   --  63* 42* 27  --   --   ALT 18  --   --   --  18 16 12   --   --   ALKPHOS 105  --   --   --  70 67 58  --   --   BILITOT 1.1  --   --   --  1.3* 1.4* 0.7  --   --   ALBUMIN 4.0  --   --   --  2.8* 2.7* 2.3*  --   --   MG  --   --   --  2.1 1.6* 1.9  --  1.5* 2.3  INR  --   --  1.1  --   --   --   --   --   --   HGBA1C  --   --   --   --  5.0  --   --   --   --    < > = values in this interval not displayed.         RADIOLOGY STUDIES/RESULTS: No results found.   LOS: 5 days   Signature  [name] M.D on 05/22/2021 at 10:55 AM   -  To page go to [email or web address]

## 2021-05-23 DIAGNOSIS — R569 Unspecified convulsions: Secondary | ICD-10-CM | POA: Diagnosis not present

## 2021-05-23 NOTE — Progress Notes (Signed)
Occupational Therapy Treatment Patient Details Name: Seth [name] MRN: [id number] DOB: 1956-08-06 Today's Date: 05/23/2021   History of present illness Patient is a 65 y.o. male who presents with confusion and right-sided weakness-thought to have breakthrough seizures and Todd's paresis. 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 PMH significant for SDH (5/22-likely traumatic) EtOH use, nonadherent to medications, L transmetatarsal amputation.   OT comments  Patient seen as co-treat with PT. Patient was pleasant and cooperative with care. Patient performed sitting on eob with posterior and lateral leaning due to right sided weakness. Transfer to recliner performed with steady.  Acute OT to continue to follow.    Recommendations for follow up therapy are one component of a multi-disciplinary discharge planning process, led by the attending physician.  Recommendations may be updated based on patient status, additional functional criteria and insurance authorization.    Follow Up Recommendations  SNF;Supervision/Assistance - 24 hour    Equipment Recommendations       Recommendations for Other Services      Precautions / Restrictions Precautions Precautions: Fall Precaution Comments: R sided weakness Restrictions Weight Bearing Restrictions: No       Mobility Bed Mobility Overal bed mobility: Needs Assistance Bed Mobility: Supine to Sit     Supine to sit: Mod assist;+2 for safety/equipment     General bed mobility comments: Pt initiated advancing LE's off EOB (to the R), and reached for the R bedrail with LUE. Assist required for transition fully to EOB with bed pad and to elevate trunk to full sitting position.    Transfers Overall transfer level: Needs assistance Equipment used: Rolling [place] (2 wheeled) Transfers: Sit to/from Stand Sit to Stand: +2 physical assistance;Max assist         General transfer comment: Initially powered up  to full stand with RW and +2 assist. Noted feet sliding forward with knee extension. Returned to sitting and utilized [employer] for transfer to the chair. Noted pt had difficulty elevating RUE    Balance Overall balance assessment:  (Unable to assess)                                         ADL either performed or assessed with clinical judgement   ADL                                               Vision       Perception     Praxis      Cognition Arousal/Alertness: Awake/alert Behavior During Therapy: WFL for tasks assessed/performed Overall Cognitive Status: Impaired/Different from baseline Area of Impairment: Awareness;Problem solving;Memory                     Memory: Decreased short-term memory     Awareness: Emergent Problem Solving: Slow processing;Requires verbal cues General Comments: Oriented x4 however seems to be confused about when his transmetatarsal ampuation occured (thinks it was this admission).        Exercises     Shoulder Instructions       General Comments      Pertinent Vitals/ Pain       Pain Assessment: Faces Faces Pain Scale: No hurt  Home Living Family/patient expects to be discharged to:: Private  residence Living Arrangements: Alone Available Help at Discharge: Family;Available PRN/intermittently Type of Home: House Home Access: Level entry     Home Layout: One level     Bathroom Shower/Tub: Teacher, early years/pre: Standard Bathroom Accessibility: Yes   Home Equipment: Environmental consultant - 2 wheels;Cane - single point;Grab bars - tub/shower;Tub bench   Additional Comments: daughter can stay with pt as needed      Prior Functioning/Environment Level of Independence: Independent with assistive device(s)        Comments: From pt and chart--uses cane, family taks him to appts (does not drive). Could make small meals for himself.   Frequency  Min 2X/week        Progress  Toward Goals  OT Goals(current goals can now be found in the care plan section)  Progress towards OT goals: Progressing toward goals  Acute Rehab OT Goals Patient Stated Goal: None stated OT Goal Formulation: Patient unable to participate in goal setting Time For Goal Achievement: 05/19/21 Potential to Achieve Goals: Fair ADL Goals Pt Will Perform Grooming: with supervision;sitting Pt Will Perform Lower Body Bathing: with mod assist;sitting/lateral leans;sit to/from stand Pt Will Perform Lower Body Dressing: with mod assist;sitting/lateral leans;sit to/from stand Pt Will Transfer to Toilet: with mod assist;stand pivot transfer Pt Will Perform Toileting - Clothing Manipulation and hygiene: with mod assist;sitting/lateral leans;sit to/from stand  Plan Discharge plan remains appropriate    Co-evaluation    PT/OT/SLP Co-Evaluation/Treatment: Yes Reason for Co-Treatment: For patient/therapist safety;To address functional/ADL transfers PT goals addressed during session: Mobility/safety with mobility;Balance;Strengthening/ROM OT goals addressed during session: ADL's and self-care      AM-PAC OT "6 Clicks" Daily Activity     Outcome Measure   Help from another person eating meals?: A Lot Help from another person taking care of personal grooming?: A Lot Help from another person toileting, which includes using toliet, bedpan, or urinal?: Total Help from another person bathing (including washing, rinsing, drying)?: Total Help from another person to put on and taking off regular upper body clothing?: A Lot Help from another person to put on and taking off regular lower body clothing?: Total 6 Click Score: 9    End of Session Equipment Utilized During Treatment: Gait belt  OT Visit Diagnosis: Unsteadiness on feet (R26.81);Other abnormalities of gait and mobility (R26.89);Muscle weakness (generalized) (M62.81);Other symptoms and signs involving the nervous system (R29.898)   Activity  Tolerance Patient limited by fatigue;Other (comment)   Patient Left with call bell/phone within reach;in chair;with chair alarm set   Nurse Communication Need for lift equipment        Time: [phone] OT Time Calculation (min): 26 min  Charges: OT General Charges $OT Visit: 1 Visit OT Treatments $Therapeutic Activity: 8-22 mins  Lodema Hong, OTA   Jonaya Freshour Alexis Goodell 05/23/2021, 2:07 PM

## 2021-05-23 NOTE — Progress Notes (Signed)
PROGRESS NOTE        PATIENT DETAILS Name: Seth [name] Age: 65 y.o. Sex: male Date of Birth: [birth year] Admit Date: 05/16/2021 Admitting Physician Evalee Mutton Kristeen Mans, MD FUX:NATFT, Myra Rude, MD  Brief Narrative: Patient is a 65 y.o. male with history of seizure disorder, SDH (5/22-likely traumatic), prior history of CVA with residual mild right-sided weakness, EtOH use, nonadherent to medications, s/p left foot osteomyelitis-s/p TMA-presented with confusion/twitching movement to the right upper/lower extremity and worsening of his chronic right-sided weakness.  He was thought to have breakthrough seizures and Todd's paresis.  He was subsequently admitted to the hospitalist service.  See below for further details.    Procedures & Tests  9/19>> COVID PCR: Negative 9/19>> UA: No significant amount of WBC. 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 9/21>> LDL:39  LTM EEG: Interstitial lateralized periodic discharges in the left hemisphere on the ictal/-interictal continuum    Subjective:  Patient in bed, appears comfortable, denies any headache, no fever, no chest pain or pressure, no shortness of breath , no abdominal pain. No new focal weakness.    Objective: Vitals: Blood pressure 110/89, pulse 97, temperature 98.6 F (37 C), temperature source Oral, resp. rate 16, height 6\' 3"  (1.905 m), weight 72.3 kg, SpO2 94 %.   Exam:  Awake Alert, No new F.N deficits, Chr . R sided weakness [place].AT,PERRAL Supple Neck,No JVD, No cervical lymphadenopathy appriciated.  Symmetrical Chest wall movement, Good air movement bilaterally, CTAB RRR,No Gallops, Rubs or new Murmurs, No Parasternal Heave +ve B.Sounds, Abd Soft, No  tenderness, No organomegaly appriciated, No rebound - guarding or rigidity. No Cyanosis, Clubbing or edema, No new Rash or bruise   Assessment/Plan:  Focal status epilepticus: No further twitching observed in RUE/RLE-LTM EEG negative for any seizure activity.  Right-sided hemiparesis is improved and back to baseline.  Neurology recommending to continue Keppra/phenobarb and Vimpat on discharge.   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  Resume statins once CK trends down further.  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 Oda Kilts are to continue supportive care with antiplatelet agents and follow in the outpatient setting.   EtOH use: Awake/alert-no signs of withdrawal-continue CIWA protocol-completed 3 days of high-dose thiamine- on thiamine 100 mg p.o. daily.    Hypomagnesemia: Replete and recheck  Hypokalemia: Replete and recheck  Fever: Afebrile for the past several days-no sources of infection apparent.  Monitor off antimicrobial therapy.  Culture negative so far.  Rhabdomyolysis: Likely due to seizures-CK levels trending down.  HTN: BP slightly on the higher side-allow some amount of permissive hypertension.    HLD: Do not hold given rhabdomyolysis.  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  8 mm right  upper lobe lung nodule:  Radiology recommending repeat CT chest in 6-12 months.  Procedures: None Consults: Neurology, vascular surgery DVT Prophylaxis: Lovenox Code Status:Full code  Family Communication: Daughter-[name]-[phone]-updated over the phone on 9/23  Time spent: 25 minutes-Greater than 50% of this time was spent in counseling, explanation of diagnosis, planning of further management, and coordination of care.  Diet: Diet Order             DIET DYS 3 Room service appropriate? Yes; Fluid consistency: Thin  Diet effective now                      Disposition Plan: Status is: Inpatient  The patient will require care spanning > 2 midnights and should be moved to inpatient because: Inpatient level of care appropriate due to severity of illness  Dispo: The patient is from: Home              Anticipated d/c is to: Home              Patient currently is not medically stable to d/c.   Difficult to place patient No    Barriers to Discharge: Breakthrough seizures-acute CVA-work-up in progress-noted at baseline-not yet stable for discharge  Antimicrobial agents: Anti-infectives (From admission, onward)    None        MEDICATIONS: Scheduled Meds:  aspirin  81 mg Oral Daily   cyanocobalamin  1,000 mcg Subcutaneous Daily   enoxaparin (LOVENOX) injection  40 mg Subcutaneous QHS   fluticasone furoate-vilanterol  1 puff Inhalation Daily   folic acid  1 mg Oral Daily   lacosamide  100 mg Oral BID   levETIRAcetam  1,000 mg Oral BID   multivitamin with minerals  1 tablet Oral Daily   pantoprazole  40 mg Oral Daily   PHENobarbital  64.8 mg Oral BID   thiamine  100 mg Oral Daily   umeclidinium bromide  1 puff Inhalation Daily   Continuous Infusions:   PRN Meds:.acetaminophen **OR** [DISCONTINUED] acetaminophen, midazolam, [DISCONTINUED] ondansetron **OR** ondansetron (ZOFRAN) IV   I have personally reviewed following labs and imaging studies  LABORATORY DATA:  Recent Labs   Lab 05/16/21 2015 05/16/21 2019 05/17/21 0528 05/19/21 0345 05/20/21 0343 05/22/21 0521  WBC 12.2*  --  7.8 10.2 7.2 7.2  HGB 13.2 14.6 10.7* 10.8* 9.0* 10.1*  HCT 41.2 43.0 33.9* 34.2* 29.3* 30.9*  PLT 173  --  150 117* 142* 205  MCV 89.2  --  91.4 91.2 90.7 89.0  MCH 28.6  --  28.8 28.8 27.9 29.1  MCHC 32.0  --  31.6 31.6 30.7 32.7  RDW 19.3*  --  19.8* 19.3* 18.8* 18.7*  LYMPHSABS 1.0  --   --   --   --   --   MONOABS 1.3*  --   --   --   --   --   EOSABS 0.0  --   --   --   --   --   BASOSABS 0.0  --   --   --   --   --     Recent Labs  Lab 05/16/21 2015 05/16/21 2019 05/16/21 2028 05/17/21 0528 05/18/21 7564 05/19/21 0345 05/20/21 0343 05/21/21 0552 05/22/21 0521  NA 136   < >  --  136 136 138 138 136 134*  K 4.9   < >  --  4.6 3.6 4.2 3.6 3.4* 4.4  CL 102   < >  --  102 104 104 106 105 104  CO2 17*  --   --  21* 20* 18* 22 20* 21*  GLUCOSE 92   < >  --  101* 82 73 90 87 84  BUN 10   < >  --  9 5* 6* 6* 6* 8  CREATININE 1.40*   < >  --  1.26* 1.02 1.20 1.04 0.95 0.99  CALCIUM 9.5  --   --  8.8* 8.7* 8.6* 8.5* 8.4* 8.6*  AST 67*  --   --   --  63* 42* 27  --   --   ALT 18  --   --   --  18 16 12   --   --   ALKPHOS 105  --   --   --  70 67 58  --   --   BILITOT 1.1  --   --   --  1.3* 1.4* 0.7  --   --   ALBUMIN 4.0  --   --   --  2.8* 2.7* 2.3*  --   --   MG  --   --   --  2.1 1.6* 1.9  --  1.5* 2.3  INR  --   --  1.1  --   --   --   --   --   --   HGBA1C  --   --   --   --  5.0  --   --   --   --    < > = values in this interval not displayed.      RADIOLOGY STUDIES/RESULTS: No results found.   LOS: 6 days   Signature  [name] M.D on 05/23/2021 at 9:36 AM   -  To page go to [email or web address]

## 2021-05-23 NOTE — TOC Progression Note (Signed)
Transition of Care [facility]) - Progression Note    Patient Details  Name: Seth [name] MRN: [id number] Date of Birth: Jun 25, 1956  Transition of Care [facility]) CM/SW [place], [place] Phone Number: 05/23/2021, 4:11 PM  Clinical Narrative:    CSW confirmed with facility they can take pt tomorrow. PT OT notes put in and authorization started. Covid ordered, plan to DC tomorrow to [facility].   Expected Discharge Plan: [place] Barriers to Discharge: Continued Medical Work up  Expected Discharge Plan and Services Expected Discharge Plan: [place] In-house Referral: Clinical Social Work Discharge Planning Services: CM Consult Post Acute Care Choice: [place] arrangements for the past 2 months: [place] Determinants of Health (SDOH) Interventions    Readmission Risk Interventions Readmission Risk Prevention Plan 06/04/2019  Transportation Screening Complete  PCP or Specialist Appt within 5-7 Days Complete  Home Care Screening Complete  Medication Review (RN CM) Complete  Some recent data might be hidden

## 2021-05-23 NOTE — Progress Notes (Signed)
Physical Therapy Treatment Patient Details Name: Seth [name] MRN: [id number] DOB: [birth year] Today's Date: 05/23/2021   History of Present Illness Patient is a 65 y.o. male who presents with confusion and right-sided weakness-thought to have breakthrough seizures and Todd's paresis. 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 PMH significant for SDH (5/22-likely traumatic) EtOH use, nonadherent to medications, L transmetatarsal amputation.    PT Comments    Pt progressing towards physical therapy goals. Continues to demonstrate R sided weakness however improved from initial evaluation. Pt was unable to achieve full stand with the RW and +2 assist this session - Stedy utilized for optimal safety. Will continue to follow and progress as able per POC.     Recommendations for follow up therapy are one component of a multi-disciplinary discharge planning process, led by the attending physician.  Recommendations may be updated based on patient status, additional functional criteria and insurance authorization.  Follow Up Recommendations  SNF;Supervision/Assistance - 24 hour     Equipment Recommendations  Other (comment) (TBD by next venue of care)    Recommendations for Other Services       Precautions / Restrictions Precautions Precautions: Fall Precaution Comments: R sided weakness Restrictions Weight Bearing Restrictions: No     Mobility  Bed Mobility Overal bed mobility: Needs Assistance Bed Mobility: Supine to Sit     Supine to sit: Mod assist;+2 for safety/equipment     General bed mobility comments: Pt initiated advancing LE's off EOB (to the R), and reached for the R bedrail with LUE. Assist required for transition fully to EOB with bed pad and to elevate trunk to full sitting position.    Transfers Overall transfer level: Needs assistance Equipment used: Rolling [place] (2 wheeled) Transfers: Sit to/from Stand Sit to Stand: +2  physical assistance;Max assist         General transfer comment: Initially powered up to full stand with RW and +2 assist. Noted feet sliding forward with knee extension. Returned to sitting and utilized [employer] for transfer to the chair. Noted pt had difficulty elevating RUE  Ambulation/Gait             General Gait Details: Unable to attempt   Stairs             Wheelchair Mobility    Modified Rankin (Stroke Patients Only) Modified Rankin (Stroke Patients Only) Pre-Morbid Rankin Score: Moderately severe disability Modified Rankin: Severe disability     Balance Overall balance assessment:  (Unable to assess)                                          Cognition Arousal/Alertness: Awake/alert Behavior During Therapy: WFL for tasks assessed/performed Overall Cognitive Status: Impaired/Different from baseline Area of Impairment: Awareness;Problem solving;Memory                     Memory: Decreased short-term memory     Awareness: Emergent Problem Solving: Slow processing;Requires verbal cues General Comments: Oriented x4 however seems to be confused about when his transmetatarsal ampuation occured (thinks it was this admission).      Exercises      General Comments        Pertinent Vitals/Pain Pain Assessment: Faces Faces Pain Scale: No hurt    Home Living Family/patient expects to be discharged to:: Private residence Living Arrangements: Alone Available Help at Discharge: Family;Available  PRN/intermittently Type of Home: House Home Access: Level entry   Home Layout: One level Home Equipment: [place] - 2 wheels;Cane - single point;Grab bars - tub/shower;Tub bench Additional Comments: daughter can stay with pt as needed    Prior Function Level of Independence: Independent with assistive device(s)      Comments: From pt and chart--uses cane, family taks him to appts (does not drive). Could make small meals for himself.    PT Goals (current goals can now be found in the care plan section) Acute Rehab PT Goals Patient Stated Goal: None stated PT Goal Formulation: Patient unable to participate in goal setting Time For Goal Achievement: 06/02/21 Potential to Achieve Goals: Good Progress towards PT goals: Progressing toward goals    Frequency    Min 3X/week      PT Plan Current plan remains appropriate    Co-evaluation PT/OT/SLP Co-Evaluation/Treatment: Yes Reason for Co-Treatment: For patient/therapist safety;To address functional/ADL transfers PT goals addressed during session: Mobility/safety with mobility;Balance;Strengthening/ROM        AM-PAC PT "6 Clicks" Mobility   Outcome Measure  Help needed turning from your back to your side while in a flat bed without using bedrails?: A Lot Help needed moving from lying on your back to sitting on the side of a flat bed without using bedrails?: A Lot Help needed moving to and from a bed to a chair (including a wheelchair)?: Total Help needed standing up from a chair using your arms (e.g., wheelchair or bedside chair)?: Total Help needed to walk in hospital room?: Total Help needed climbing 3-5 steps with a railing? : Total 6 Click Score: 8    End of Session Equipment Utilized During Treatment: Gait belt Activity Tolerance: Patient tolerated treatment well Patient left: in chair;with call bell/phone within reach;with chair alarm set Nurse Communication: Mobility status PT Visit Diagnosis: Other symptoms and signs involving the nervous system (R29.898)     Time: 4403-4742 PT Time Calculation (min) (ACUTE ONLY): 26 min  Charges:  $Therapeutic Activity: 8-22 mins                     Rolinda Roan, PT, DPT Acute Rehabilitation Services Pager: [phone] Office: [phone]    Thelma Comp 05/23/2021, 1:56 PM

## 2021-05-24 DIAGNOSIS — E440 Moderate protein-calorie malnutrition: Secondary | ICD-10-CM | POA: Diagnosis not present

## 2021-05-24 DIAGNOSIS — I10 Essential (primary) hypertension: Secondary | ICD-10-CM | POA: Diagnosis not present

## 2021-05-24 DIAGNOSIS — R202 Paresthesia of skin: Secondary | ICD-10-CM | POA: Diagnosis not present

## 2021-05-24 DIAGNOSIS — G9340 Encephalopathy, unspecified: Secondary | ICD-10-CM | POA: Diagnosis not present

## 2021-05-24 DIAGNOSIS — I639 Cerebral infarction, unspecified: Secondary | ICD-10-CM | POA: Diagnosis not present

## 2021-05-24 DIAGNOSIS — D329 Benign neoplasm of meninges, unspecified: Secondary | ICD-10-CM | POA: Diagnosis not present

## 2021-05-24 DIAGNOSIS — R5381 Other malaise: Secondary | ICD-10-CM | POA: Diagnosis not present

## 2021-05-24 DIAGNOSIS — L89159 Pressure ulcer of sacral region, unspecified stage: Secondary | ICD-10-CM | POA: Diagnosis not present

## 2021-05-24 DIAGNOSIS — E785 Hyperlipidemia, unspecified: Secondary | ICD-10-CM | POA: Diagnosis not present

## 2021-05-24 DIAGNOSIS — R296 Repeated falls: Secondary | ICD-10-CM | POA: Diagnosis not present

## 2021-05-24 DIAGNOSIS — R1312 Dysphagia, oropharyngeal phase: Secondary | ICD-10-CM | POA: Diagnosis not present

## 2021-05-24 DIAGNOSIS — R531 Weakness: Secondary | ICD-10-CM | POA: Diagnosis not present

## 2021-05-24 DIAGNOSIS — G629 Polyneuropathy, unspecified: Secondary | ICD-10-CM | POA: Diagnosis not present

## 2021-05-24 DIAGNOSIS — S065X0S Traumatic subdural hemorrhage without loss of consciousness, sequela: Secondary | ICD-10-CM | POA: Diagnosis not present

## 2021-05-24 DIAGNOSIS — I4729 Other ventricular tachycardia: Secondary | ICD-10-CM | POA: Diagnosis not present

## 2021-05-24 DIAGNOSIS — M6281 Muscle weakness (generalized): Secondary | ICD-10-CM | POA: Diagnosis not present

## 2021-05-24 DIAGNOSIS — S06300A Unspecified focal traumatic brain injury without loss of consciousness, initial encounter: Secondary | ICD-10-CM | POA: Diagnosis not present

## 2021-05-24 DIAGNOSIS — R002 Palpitations: Secondary | ICD-10-CM | POA: Diagnosis not present

## 2021-05-24 DIAGNOSIS — I69351 Hemiplegia and hemiparesis following cerebral infarction affecting right dominant side: Secondary | ICD-10-CM | POA: Diagnosis not present

## 2021-05-24 DIAGNOSIS — E46 Unspecified protein-calorie malnutrition: Secondary | ICD-10-CM | POA: Diagnosis not present

## 2021-05-24 DIAGNOSIS — J449 Chronic obstructive pulmonary disease, unspecified: Secondary | ICD-10-CM | POA: Diagnosis not present

## 2021-05-24 DIAGNOSIS — R2689 Other abnormalities of gait and mobility: Secondary | ICD-10-CM | POA: Diagnosis not present

## 2021-05-24 DIAGNOSIS — R1310 Dysphagia, unspecified: Secondary | ICD-10-CM | POA: Diagnosis not present

## 2021-05-24 DIAGNOSIS — Z7289 Other problems related to lifestyle: Secondary | ICD-10-CM | POA: Diagnosis not present

## 2021-05-24 DIAGNOSIS — G40909 Epilepsy, unspecified, not intractable, without status epilepticus: Secondary | ICD-10-CM | POA: Diagnosis not present

## 2021-05-24 DIAGNOSIS — I472 Ventricular tachycardia, unspecified: Secondary | ICD-10-CM | POA: Diagnosis not present

## 2021-05-24 DIAGNOSIS — G8191 Hemiplegia, unspecified affecting right dominant side: Secondary | ICD-10-CM | POA: Diagnosis not present

## 2021-05-24 DIAGNOSIS — Z0000 Encounter for general adult medical examination without abnormal findings: Secondary | ICD-10-CM | POA: Diagnosis not present

## 2021-05-24 DIAGNOSIS — R569 Unspecified convulsions: Secondary | ICD-10-CM | POA: Diagnosis not present

## 2021-05-24 DIAGNOSIS — M6282 Rhabdomyolysis: Secondary | ICD-10-CM | POA: Diagnosis not present

## 2021-05-24 DIAGNOSIS — D500 Iron deficiency anemia secondary to blood loss (chronic): Secondary | ICD-10-CM | POA: Diagnosis not present

## 2021-05-24 DIAGNOSIS — Z743 Need for continuous supervision: Secondary | ICD-10-CM | POA: Diagnosis not present

## 2021-05-24 DIAGNOSIS — I720 Aneurysm of carotid artery: Secondary | ICD-10-CM | POA: Diagnosis not present

## 2021-05-24 DIAGNOSIS — G459 Transient cerebral ischemic attack, unspecified: Secondary | ICD-10-CM | POA: Diagnosis not present

## 2021-05-24 DIAGNOSIS — R278 Other lack of coordination: Secondary | ICD-10-CM | POA: Diagnosis not present

## 2021-05-24 DIAGNOSIS — F32A Depression, unspecified: Secondary | ICD-10-CM | POA: Diagnosis not present

## 2021-05-24 DIAGNOSIS — M869 Osteomyelitis, unspecified: Secondary | ICD-10-CM | POA: Diagnosis not present

## 2021-05-24 DIAGNOSIS — Z5181 Encounter for therapeutic drug level monitoring: Secondary | ICD-10-CM | POA: Diagnosis not present

## 2021-05-24 DIAGNOSIS — K2100 Gastro-esophageal reflux disease with esophagitis, without bleeding: Secondary | ICD-10-CM | POA: Diagnosis not present

## 2021-05-24 DIAGNOSIS — N179 Acute kidney failure, unspecified: Secondary | ICD-10-CM | POA: Diagnosis not present

## 2021-05-24 DIAGNOSIS — I491 Atrial premature depolarization: Secondary | ICD-10-CM | POA: Diagnosis not present

## 2021-05-24 DIAGNOSIS — U071 COVID-19: Secondary | ICD-10-CM | POA: Diagnosis not present

## 2021-05-24 LAB — SARS CORONAVIRUS 2 (TAT 6-24 HRS): SARS Coronavirus 2: NEGATIVE

## 2021-05-24 MED ORDER — LACOSAMIDE 100 MG PO TABS: 100.0000 mg | ORAL_TABLET | Freq: Two times a day (BID) | ORAL | Status: DC

## 2021-05-24 MED ORDER — PHENOBARBITAL 64.8 MG PO TABS: 64.8000 mg | ORAL_TABLET | Freq: Two times a day (BID) | ORAL | Status: DC

## 2021-05-24 MED ORDER — METOPROLOL TARTRATE 50 MG PO TABS
50.0000 mg | ORAL_TABLET | Freq: Two times a day (BID) | ORAL | Status: DC
  Administered 2021-05-24: 50 mg via ORAL
  Filled 2021-05-24: qty 1

## 2021-05-24 MED ORDER — CLOPIDOGREL BISULFATE 75 MG PO TABS
75.0000 mg | ORAL_TABLET | Freq: Every day | ORAL | Status: DC
  Administered 2021-05-24: 75 mg via ORAL
  Filled 2021-05-24: qty 1

## 2021-05-24 MED ORDER — PHENOBARBITAL 64.8 MG PO TABS: 64.8000 mg | ORAL_TABLET | Freq: Two times a day (BID) | ORAL | 0 refills | Status: DC

## 2021-05-24 MED ORDER — CLOPIDOGREL BISULFATE 75 MG PO TABS: 75.0000 mg | ORAL_TABLET | Freq: Every day | ORAL | Status: DC

## 2021-05-24 MED ORDER — LACOSAMIDE 100 MG PO TABS: 100.0000 mg | ORAL_TABLET | Freq: Two times a day (BID) | ORAL | 0 refills | Status: DC

## 2021-05-24 NOTE — Discharge Summary (Signed)
Seth [name] NGE:[id number] DOB: 01-25-1956 DOA: 05/16/2021  PCP: Lucianne Lei, MD  Admit date: 05/16/2021  Discharge date: 05/24/2021  Admitted From: Home  Disposition:  SNF   Recommendations for Outpatient Follow-up:   Follow up with PCP in 1-2 weeks  PCP Please obtain BMP/CBC, 2 view CXR in 1week,  (see Discharge instructions)   PCP Please follow up on the following pending results:    Home Health: None   Equipment/Devices: None  Consultations: None  Discharge Condition: Stable    CODE STATUS: Full    Diet Recommendation:   Diet Order             DIET DYS 3 Room service appropriate? Yes; Fluid consistency: Thin  Diet effective now                    Chief Complaint  Patient presents with   Code Stroke     Brief history of present illness from the day of admission and additional interim summary    Patient is a 65 y.o. male with history of seizure disorder, SDH (5/22-likely traumatic), prior history of CVA with residual mild right-sided weakness, EtOH use, nonadherent to medications, s/p left foot osteomyelitis-s/p TMA-presented with confusion/twitching movement to the right upper/lower extremity and worsening of his chronic right-sided weakness.  He was thought to have breakthrough seizures and Todd's paresis.  He was subsequently admitted to the hospitalist service.  See below for further details.     Procedures & Tests   9/19>> COVID PCR: Negative 9/19>> UA: No significant amount of WBC. 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 9/21>> LDL:39    LTM EEG: Interstitial lateralized periodic discharges in the left hemisphere on the ictal/-interictal continuum                                                                  Hospital Course    Focal status epilepticus: No further twitching observed in RUE/RLE-LTM EEG negative for any seizure activity.  Right-sided hemiparesis is improved and back to baseline.  Neurology recommending to continue Keppra/phenobarb and Vimpat on discharge.    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  Resume statins  .   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 Oda Kilts are to continue supportive care with antiplatelet agents and follow in the  outpatient setting in 2-3 weeks.    EtOH use: Awake/alert-no signs of withdrawal-continue CIWA protocol-completed 3 days of high-dose thiamine- on thiamine 100 mg p.o. daily.     Rhabdomyolysis: Likely due to seizures-CK levels trending down.   HTN: Stable off meds.    HLD:  on Statin.   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   8 mm right upper lobe lung nodule: Radiology recommending repeat CT chest in 6-12 months. PCP to arrange, have requested Pulm followup as well.     Discharge diagnosis     Principal Problem:   Seizures ([place]) Active Problems:   Chronic alcoholism (HCC)   Fever   Seizure disorder (HCC)    Cerebral thrombosis with cerebral infarction    Discharge instructions    Discharge Instructions     Discharge instructions   Complete by: As directed    Follow with Primary MD Lucianne Lei, MD in 7 days   Get CBC, CMP, 2 view Chest X ray -  checked next visit within 1 week by SNF MD    Activity: As tolerated with Full fall precautions use Tuff/cane & assistance as needed  Disposition SNF  Diet: Dysphagia 3 diet with feeding assistance and aspiration precautions.  Special Instructions: If you have smoked or chewed Tobacco  in the last 2 yrs please stop smoking, stop any regular Alcohol  and or any Recreational drug use.  On your next visit with your primary care physician please Get Medicines reviewed and adjusted.  Please request your Prim.MD to go over all Hospital Tests and Procedure/Radiological results at the follow up, please get all Hospital records sent to your Prim MD by signing hospital release before you go home.  If you experience worsening of your admission symptoms, develop shortness of breath, life threatening emergency, suicidal or homicidal thoughts you must seek medical attention immediately by calling 911 or calling your MD immediately  if symptoms less severe.  You Must read complete instructions/literature along with all the possible adverse reactions/side effects for all the Medicines you take and that have been prescribed to you. Take any new Medicines after you have completely understood and accpet all the possible adverse reactions/side effects.   Increase activity slowly   Complete by: As directed        Discharge Medications   Allergies as of 05/24/2021   No Known Allergies      Medication List     STOP taking these medications    fluticasone furoate-vilanterol 200-25 MCG/INH Aepb Commonly known as: BREO ELLIPTA   Spiriva Respimat 2.5 MCG/ACT Aers Generic drug: Tiotropium Bromide Monohydrate       TAKE these medications     albuterol 108 (90 Base) MCG/ACT inhaler Commonly known as: VENTOLIN HFA Inhale 2 puffs into the lungs every 6 (six) hours as needed for wheezing or shortness of breath.   aspirin 81 MG chewable tablet Chew 1 tablet (81 mg total) by mouth daily.   clopidogrel 75 MG tablet Commonly known as: PLAVIX Take 1 tablet (75 mg total) by mouth daily.   cyanocobalamin 1000 MCG tablet Take 1 tablet (1,000 mcg total) by mouth daily.   feeding supplement Liqd Take 237 mLs by mouth 3 (three) times daily between meals.   ferrous sulfate 325 (65  FE) MG tablet Take 1 tablet (325 mg total) by mouth daily with breakfast.   fluticasone-salmeterol 500-50 MCG/ACT Aepb Commonly known as: ADVAIR Inhale 1 puff into the lungs 2 (two) times daily.   folic acid 1 MG tablet Commonly known as: FOLVITE Take 1 tablet (1 mg total) by mouth daily.   Incruse Ellipta 62.5 MCG/INH Aepb Generic drug: umeclidinium bromide INHALE 1 PUFF INTO THE LUNGS DAILY   Lacosamide 100 MG Tabs Take 1 tablet (100 mg total) by mouth 2 (two) times daily.   levETIRAcetam 750 MG tablet Commonly known as: KEPPRA Take 2 tablets (1,500 mg total) by mouth 2 (two) times daily.   metoprolol tartrate 50 MG tablet Commonly known as: LOPRESSOR Take 1 tablet (50 mg total) by mouth 2 (two) times daily.   multivitamin with minerals Tabs tablet Take 1 tablet by mouth daily.   pantoprazole 40 MG tablet Commonly known as: PROTONIX Take 1 tablet (40 mg total) by mouth daily.   PHENobarbital 64.8 MG tablet Commonly known as: LUMINAL Take 1 tablet (64.8 mg total) by mouth 2 (two) times daily.   polyethylene glycol 17 g packet Commonly known as: MIRALAX / GLYCOLAX Take 17 g by mouth daily as needed for moderate constipation.   rosuvastatin 10 MG tablet Commonly known as: CRESTOR Take 1 tablet (10 mg total) by mouth daily.   thiamine 100 MG tablet Take 1 tablet (100 mg total) by mouth daily.         Contact information  for follow-up providers     Lucianne Lei, MD. Schedule an appointment as soon as possible for a visit in 1 week(s).   Specialty: Family Medicine Contact information: [address] [place] [address] [phone]         Elouise Munroe, MD. Schedule an appointment as soon as possible for a visit in 1 week(s).   Specialties: Cardiology, Radiology Contact information: [address] [place] [address] [phone]         Waynetta Sandy, MD. Schedule an appointment as soon as possible for a visit in 2 week(s).   Specialties: Vascular Surgery, Cardiology Why: carotid dissection Contact information: [place] [address] [place]. Schedule an appointment as soon as possible for a visit in 1 week(s).   Why: Seizures Contact information: [address]     [address] [place] [address] [phone]        Spero Geralds, MD Follow up in 2 month(s).   Specialty: Pulmonary Disease Why: Lung Nodule Contact information: [place] [address] [phone]         Consuella Lose, MD Follow up in 2 week(s).   Specialty: Neurosurgery Contact information: [address] [place] [address] [phone]              Contact information for after-discharge care     Destination     [facility] Preferred SNF .   Service: Skilled Nursing Contact information: [address] [place] [phone]                     Major procedures and Radiology Reports - PLEASE review detailed and final reports thoroughly  -       CT ANGIO HEAD NECK W WO CM  Result Date: 05/18/2021 CLINICAL DATA:  Follow-up examination for acute  stroke. EXAM: CT ANGIOGRAPHY HEAD AND NECK TECHNIQUE: Multidetector CT imaging of the head and neck was performed using the  standard protocol during bolus administration of intravenous contrast. Multiplanar CT image reconstructions and MIPs were obtained to evaluate the vascular anatomy. Carotid stenosis measurements (when applicable) are obtained utilizing NASCET criteria, using the distal internal carotid diameter as the denominator. CONTRAST:  114mL OMNIPAQUE IOHEXOL 350 MG/ML SOLN COMPARISON:  Prior MRI from earlier the same day as well as previous CT from 05/16/2021. FINDINGS: CT HEAD FINDINGS Brain: Moderately advanced age-related cerebral atrophy. Patchy hypodensity involving the periventricular and deep white matter both cerebral hemispheres most consistent with chronic small vessel ischemic disease. Multiple scattered remote bilateral cerebellar infarcts. Previously identified small left frontal infarct not visible by CT. No other acute large vessel territory infarct. No acute intracranial hemorrhage. 1.4 cm right parafalcine meningioma without associated mass effect, stable. No other visible mass lesion or mass effect. Diffuse ventriculomegaly, stable from prior. No extra-axial fluid collection. Vascular: No hyperdense vessel. Calcified atherosclerosis present at skull base. Skull: Scalp soft tissues and calvarium within normal limits. Sinuses: Paranasal sinuses and mastoid air cells are clear. Orbits: Globes and orbital soft tissues demonstrate no acute finding. Review of the MIP images confirms the above findings CTA NECK FINDINGS Aortic arch: Visualized aortic arch normal in caliber with normal 3 vessel morphology. Mild-to-moderate atheromatous change within the arch itself. No hemodynamically significant stenosis seen about the origin of the great vessels. Right carotid system: Scattered nonstenotic soft plaque seen within the right CCA. Bulky calcified plaque about the right carotid bulb/proximal right ICA without hemodynamically significant stenosis. Distally, there is mild smooth narrowing of the mid cervical right ICA.  Adjacent thrombosed focal outpouching with peripheral calcification (series 14, image 411). 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 No acute appearing intraluminal thrombus or frank raised dissection flap. Thrombosed aneurysm measures approximately 5 x 6 x 12 mm in size. Associated stenosis is mild measuring no more than 35%. Distally, right ICA widely patent to the skull base. Left carotid system: Scattered nonstenotic plaque noted within the left CCA. Bulky calcified plaque about the left carotid bulb/proximal left ICA without hemodynamically significant stenosis. There is a similar but more prominent dissecting pseudoaneurysm involving the contralateral mid cervical left ICA (series 14, image 400). This pseudoaneurysm is also largely thrombosed, although a small area of persistent filling is seen at its superior aspect (series 14, image 419). Aneurysm itself measures approximately 14 x 11 x 19 mm. There is intimal irregularity about the adjacent cervical left ICA at this level, suggesting a possible acute component/thrombus (series 14, image 400). Associated stenosis measures up to approximately 50% by NASCET criteria. Distally, the left ICA is otherwise patent to the skull base. Vertebral arteries: Both vertebral arteries arise from the subclavian arteries. No proximal subclavian artery stenosis. Right vertebral artery slightly dominant. Atheromatous change at the origins of both vertebral arteries with associated severe ostial stenosis on the right (series 14, image 217). No more than mild stenosis on the left. Otherwise, vertebral arteries patent without stenosis, dissection or occlusion. Skeleton: Mild compression deformities noted involving the superior endplates of T4 and T5, somewhat age indeterminate, but new as compared to priors chest CT from 02/19/2019 (series 18, image 143). No other visible acute osseous finding. Prior ACDF at C3-4 with solid arthrodesis. No  discrete or worrisome osseous lesions. Patient is edentulous. Other neck: No other acute soft tissue abnormality within the neck. No mass or adenopathy. Upper  chest: Scattered centrilobular emphysema. 8 mm nodule present along the right major fissure (series 18, image 117). Review of the MIP images confirms the above findings CTA HEAD FINDINGS Anterior circulation: Petrous segments patent bilaterally. Scattered atheromatous change throughout the carotid siphons with associated mild to moderate multifocal narrowing. A1 segments patent bilaterally. Normal anterior communicating artery complex. Anterior cerebral arteries patent to their distal aspects without hemodynamically significant stenosis. M1 segments patent without stenosis or occlusion. Normal MCA bifurcations. Distal MCA branches well perfused and symmetric. Posterior circulation: Diminutive vertebral arteries patent to the vertebrobasilar junction without stenosis. Both PICA origins patent and normal. Basilar diffusely diminutive but patent to its distal aspect. Superior cerebellar arteries patent bilaterally. Predominant fetal type origin of both PCAs. PCAs mildly irregular but patent to their distal aspects without high-grade stenosis. Venous sinuses: Grossly patent allowing for timing the contrast bolus. Anatomic variants: Fetal type origin of the PCAs with overall diminutive vertebrobasilar system. Review of the MIP images confirms the above findings IMPRESSION: CT HEAD IMPRESSION: 1. Stable head CT. Previously identified small left frontal infarct not visible by CT. No other acute intracranial abnormality. 2. Advanced cerebral atrophy with chronic microvascular ischemic disease, with multiple chronic bilateral cerebellar infarcts. 3. 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 CTA HEAD AND NECK IMPRESSION: 1. Negative CTA for large vessel occlusion. 2. Probable dissecting pseudoaneurysms involving the mid cervical ICAs bilaterally, left  larger than right. Associated stenoses measures up to approximately 50% on the left and 35% on the right. The right pseudoaneurysm is largely chronic in appearance, while the left demonstrates associated intimal irregularity, suggesting intraluminal thrombus related to an acute component. 3. Atheromatous change throughout the carotid siphons with associated mild to moderate multifocal narrowing. 4. Fetal type origin of the PCAs with overall diminutive vertebrobasilar system. Severe ostial stenosis at the origin of the right vertebral artery. 5. Mild compression deformities involving the superior endplates of T4 and T5, age indeterminate, but new as compared to prior chest CT from 02/19/2019. Correlation with physical exam recommended. 6. 8 mm right upper lobe nodule, indeterminate. Non-contrast chest CT at 6-12 months is recommended. If the nodule is stable at time of repeat CT, then future CT at 18-24 months (from today's scan) is considered optional for low-risk patients, but is recommended for high-risk patients. This recommendation follows the consensus statement: Guidelines for Management of Incidental Pulmonary Nodules Detected on CT Images: From the Fleischner Society 2017; Radiology 2017; 284:228-243. 7. Aortic Atherosclerosis (ICD10-I70.0) and Emphysema (ICD10-J43.9). These results were communicated to Dr. Curly Shores at 3:01 am on 05/18/2021 by text page via the [facility] messaging system. Electronically Signed   By: Jeannine Boga M.D.   On: 05/18/2021 03:00   MR BRAIN WO CONTRAST  Result Date: 05/17/2021 CLINICAL DATA:  Right-sided weakness, altered mental status EXAM: MRI HEAD WITHOUT CONTRAST TECHNIQUE: Multiplanar, multiecho pulse sequences of the brain and surrounding structures were obtained without intravenous contrast. COMPARISON:  Noncontrast CT head obtained 1 day prior FINDINGS: Image quality is moderately motion degraded. Brain: There are small foci of diffusion restriction in the left frontal  lobe inferolaterally. There is no other evidence of acute infarct. There is no acute intracranial hemorrhage or extra-axial fluid collection. There are multiple remote lacunar infarcts in the cerebellar hemispheres and right basal ganglia. There is moderate parenchymal volume loss with ex vacuo enlargement of the ventricular system. There is mild FLAIR signal abnormality in the subcortical and periventricular white matter likely reflecting sequela of chronic white matter microangiopathy. A 1.0  cm right parafalcine lesion likely reflecting a meningioma is stable. There is no new solid mass lesion. There is no midline shift. Vascular: Normal flow voids. Skull and upper cervical spine: Normal marrow signal. Sinuses/Orbits: The imaged paranasal sinuses are clear. The globes and orbits are unremarkable. Other: None. IMPRESSION: 1. Small acute infarct in the left frontal lobe inferolaterally. 2. Moderate global parenchymal volume loss with enlargement of the ventricular system, advanced for age. 3. 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 Electronically Signed   By: Valetta Mole M.D.   On: 05/17/2021 11:32   CARDIAC EVENT MONITOR  Result Date: 05/05/2021 Indication: VT Minimum HR (bpm): 61 Maximum HR (bpm): 157 Supraventricular Ectopy: <1% SVT: none Ventricular Ectopy: <1% NSVT: none Ventricular Tachycardia: none Pauses: none AV block: none Atrial fibrillation: none Diary events: tired/fatigue, SOB, "passed out", chest pain associated with sinus rhythm and artifact. Patient contacted and family states he was well.   No ventricular tachycardia or NSVT on monitor.  DG CHEST PORT 1 VIEW  Result Date: 05/16/2021 CLINICAL DATA:  Right-sided weakness. EXAM: PORTABLE CHEST 1 VIEW COMPARISON:  Chest radiograph dated 03/14/2021. FINDINGS: No focal consolidation, pleural effusion or pneumothorax. The cardiac silhouette is within normal limits. Atherosclerotic calcification of the aorta. No acute osseous pathology.  IMPRESSION: No active disease. Electronically Signed   By: Anner Crete M.D.   On: 05/16/2021 23:26   EEG adult  Result Date: 05/18/2021 Derek Jack, MD     05/18/2021 12:28 PM Routine EEG Repot DAIL LEREW is a 65 y.o. male with a history of seizures who is undergoing an EEG to evaluate for seizures. Report: This EEG was acquired with electrodes placed according to the International 10-20 electrode system (including Fp1, Fp2, F3, F4, C3, C4, P3, P4, O1, O2, T3, T4, T5, T6, A1, A2, Fz, Cz, Pz). The following electrodes were missing or displaced: none. There was no clear occipital dominant rhythm. The best background was 6-8 Hz with overlying beta frequencies. This activity is reactive to stimulation. Drowsiness was manifested by background fragmentation; deeper stages of sleep were not identified. There was no focal slowing. There were intermittent sharp waves over the left hemisphere that at times briefly become rhythmic up to 1 Hz. There were no electrographic seizures identified. Photic stimulation and hyperventilation were not performed. Impression: This EEG was obtained while sedated on midazolam and phenobarbital and drowsy and is abnormal due to: - Mild diffuse slowing indicating global cerebral dysfunction - Sharp waves over the left hemisphere indicating increased epileptogenicity in this region Beta frequencies are secondary to medication effect from benzodiazepines. There were no electrographic seizures seen in this recording. Su Monks, MD [facility] [phone] If 7pm- 7am, please page neurology on call as listed in [place].   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 EEG to evaluate for seizure. Level of alertness: Lethargic AEDs during EEG study: PB, LCM, LEV, Versed Technical aspects: This EEG study was done with scalp electrodes positioned according to the 10-20 International system of electrode placement. Electrical activity was acquired at a sampling rate of 500Hz  and reviewed with a high frequency filter of 70Hz  and a  low frequency filter of 1Hz . EEG data were recorded continuously and digitally stored. Description: No clear posterior dominant rhythm was seen. EEG showed continuous generalized 3 to 5 Hz theta- delta slowing.  Lateralized periodic discharges with overriding fast activity were noted in left hemisphere at 1 Hz which at times appeared rhythmic without evolution.  ABNORMALITY - Lateralized periodic discharges with overriding fast activity ( LPD +), left hemisphere - Continuous slow, generalized  IMPRESSION: This study showed lateralized periodic discharges with overriding fast activity in left hemisphere which are on the ictal-interictal continuum with high potential for seizure recurrence.  There is also moderate to severe diffuse encephalopathy, nonspecific to etiology but could be secondary to seizures. Lora Havens   CT HEAD CODE STROKE WO CONTRAST  Result Date: 05/16/2021 CLINICAL DATA:  Code stroke. EXAM: CT HEAD WITHOUT CONTRAST TECHNIQUE: Contiguous axial images were obtained from the base of the skull through the vertex without intravenous contrast. COMPARISON:  03/13/2021 FINDINGS: Brain: No evidence of acute infarction, hemorrhage, cerebral edema, mass, mass effect, or midline shift. No extra-axial fluid collection. Periventricular white matter changes, likely the sequela of chronic small vessel ischemic disease. Lacunar infarcts in the cerebellum, right-greater-than-left. Vascular: No hyperdense vessel. Atherosclerotic calcifications in the  intracranial carotid and vertebral arteries. Skull: Normal. Negative for fracture or focal lesion. Sinuses/Orbits: No acute finding. Other: The mastoid air cells are well aerated. ASPECTS [facility] Stroke Program Early CT Score) - Ganglionic level infarction (caudate, lentiform nuclei, internal capsule, insula, M1-M3 cortex): 7 - Supraganglionic infarction (M4-M6 cortex): 3 Total score (0-10 with 10 being normal): 10 IMPRESSION: 1. No acute intracranial process. 2. ASPECTS is 10 Code stroke imaging results were communicated on 05/16/2021 at 8:33 pm to provider Dr. Curly Shores via secure text paging. Electronically Signed   By: Merilyn Baba M.D.   On: 05/16/2021 20:34   CT ANGIO HEAD NECK W WO CM (CODE STROKE)  Result Date: 05/18/2021 CLINICAL DATA:  Neuro deficit, acute, stroke suspected Unstable plaque and recent focal status, Todd's paralysis vs. L MCA occlusion EXAM: CT ANGIOGRAPHY HEAD AND NECK TECHNIQUE: Multidetector CT imaging of the head and neck was performed using the standard protocol during bolus administration of intravenous contrast. Multiplanar CT image reconstructions and MIPs were obtained to evaluate the vascular anatomy. Carotid stenosis measurements (when applicable) are obtained utilizing NASCET criteria, using the distal internal carotid diameter as the denominator. CONTRAST:  39mL OMNIPAQUE IOHEXOL 350 MG/ML SOLN COMPARISON:  CTA head/neck from 05/17/2020. CT chest February 19, 2019. FINDINGS: CTA NECK FINDINGS Aortic arch: No hemodynamically significant stenosis of the great vessel origins. Atherosclerosis. Right carotid system: Similar atherosclerosis of the common carotid artery without greater than 50% stenosis. Similar moderate predominately calcific atherosclerosis at the carotid bifurcation without greater than 50% stenosis. 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 Thrombosed aneurysm is similar in size, measuring approximately 5 x 12 mm. Associated stenosis is similar, measuring approximately 35% relative to the distal vessel. Left carotid system: Moderate predominately calcific atherosclerosis at the carotid bifurcation without greater than 50% stenosis. 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 As before, the pseudoaneurysm is also largely thrombosed with small area of filling superiorly. Similar intimal irregularity and narrowing of the associated ICA with approximally 50% stenosis relative to the distal vessel. Vertebral arteries:  Similar severe stenosis of the right vertebral artery origin and mild stenosis of the left vertebral artery origin. Otherwise, vertebral arteries are patent without significant stenosis. Skeleton: Similar mild compression deformities of the superior endplates of T4. The previously seen T5 compression deformity is not imaged. C3-C4 ACDF. Other neck: No acute findings. Upper chest: Incidentally imaged approximately 6 mm pulmonary nodule in the right upper lobe, previously characterized as benign on 2020 CT chest. Visualized lung apices are clear. Emphysema. Review of the MIP images confirms the above findings CTA HEAD FINDINGS Anterior circulation: Similar scattered atherosclerosis of bilateral intracranial ICAs with associated multifocal mild-to-moderate narrowing. Bilateral ACAs are patent without proximal hemodynamically significant stenosis. Bilateral MCAs are patent without proximal hemodynamically significant stenosis. Posterior circulation: Small vertebrobasilar system with bilateral fetal type PCAs, anatomic variant and similar to prior. Bilateral PCAs are patent without proximal hemodynamically significant stenosis. Venous sinuses: As permitted by contrast timing, patent. Anatomic variants: See above.  Other: Incidentally imaged 14 by 8 mm homogeneously enhancing dural-based mass along the anterior right falx, compatible with known meningioma that was better characterized on prior MRI. Review of the MIP images confirms the above findings IMPRESSION: 1. No change from the CTA from yesterday. 2. No large vessel occlusion or proximal hemodynamically significant stenosis intracranially. 3. 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 Similar associated stenosis of the ICAs, approximately 50% on the left and 35% on the right. 4. Atherosclerosis of the intracranial ICAs with mild-to-moderate narrowing. 5. 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 6. Mild compression deformities of mid thoracic vertebral bodies, better characterized on CTA from yesterday. 7. Incidentally imaged approximately 6 mm pulmonary nodule in the right upper lobe, previously characterized as benign on 2020 CT chest. 8. Aortic Atherosclerosis (ICD10-I70.0) and Emphysema (ICD10-J43.9). Electronically Signed   By: Margaretha Sheffield M.D.   On: 05/18/2021 08:09     Today   Subjective    Seth [name] today has no headache,no chest abdominal pain,no new weakness tingling or numbness, feels much better.   Objective   Blood pressure 122/74, pulse 81, temperature 98.6 F (37 C), temperature source Oral, resp. rate 20, height 6\' 3"  (1.905 m), weight 72.3 kg, SpO2 100 %.   Intake/Output Summary (Last 24 hours) at 05/24/2021 0941 Last data filed at 05/24/2021 0019 Gross per 24 hour  Intake --  Output 400 ml  Net -400 ml    Exam  Awake Alert, No new F.N deficits, chronic mild R sided weakness, Normal affect Maish Vaya.AT,PERRAL Supple Neck,No JVD, No cervical lymphadenopathy appriciated.  Symmetrical Chest wall movement, Good air movement bilaterally, CTAB RRR,No Gallops,Rubs or new Murmurs, No Parasternal Heave +ve B.Sounds, Abd Soft, Non  tender, No organomegaly appriciated, No rebound -guarding or rigidity. No Cyanosis, Clubbing or edema, No new Rash or bruise   Data Review   CBC w Diff:  Lab Results  Component Value Date   WBC 7.2 05/22/2021   HGB 10.1 (L) 05/22/2021   HGB 12.1 (L) 05/08/2018   HCT 30.9 (L) 05/22/2021   HCT 28.2 (L) 02/21/2019   PLT 205 05/22/2021   PLT 225 05/08/2018   LYMPHOPCT 8 05/16/2021   MONOPCT 11 05/16/2021   EOSPCT 0 05/16/2021   BASOPCT 0 05/16/2021    CMP:  Lab Results  Component Value Date   NA 134 (L) 05/22/2021   NA 145 (H) 05/08/2018   K 4.4 05/22/2021   CL 104 05/22/2021   CO2 21 (  L) 05/22/2021   BUN 8 05/22/2021   BUN 8 05/08/2018   CREATININE 0.99 05/22/2021   PROT 5.1 (L) 05/20/2021   PROT 6.8 05/08/2018   ALBUMIN 2.3 (L) 05/20/2021   ALBUMIN 4.2 05/08/2018   BILITOT 0.7 05/20/2021   BILITOT 0.5 05/08/2018   ALKPHOS 58 05/20/2021   AST 27 05/20/2021   ALT 12 05/20/2021  .   Total Time in preparing paper work, data evaluation and todays exam - 65 minutes  [name] M.D on 05/24/2021 at 9:41 AM  [facility]

## 2021-05-24 NOTE — Progress Notes (Signed)
Covid Nasal Swab sent to lab

## 2021-05-24 NOTE — Progress Notes (Signed)
Handoff report given to [facility] nurse at 1600.

## 2021-05-24 NOTE — Consult Note (Signed)
   [facility] CM Inpatient Consult   05/24/2021  IVAR DOMANGUE 1955-12-14 [id number]  [facility] [[facility]] Patient: [employer]   Patient screened for hospitalization with noted high risk score for unplanned readmission risk and  to assess for potential [place] Management service needs for post hospital transition.  Review of patient's medical record reveals patient is for a skilled nursing facility level of care for transition.   Plan:  Needs are to be met at a skilled nursing facility and no [facility] Care Management needs noted.    For questions contact:   Natividad Brood, RN BSN [facility] Liaison  [phone] business mobile phone Toll free office [phone]  Fax number: [phone] [email or web address]

## 2021-05-24 NOTE — TOC Transition Note (Signed)
Transition of Care [facility]) - CM/SW Discharge Note   Patient Details  Name: Seth [name] MRN: [id number] Date of Birth: [birth year]  [facility]) CM/SW Contact:  Pollie Friar, RN Phone Number: 05/24/2021, 12:48 PM   Clinical Narrative:    Patient is discharging to [facility] today. [facility] will provide transport. Bedside RN updated and d/c packet is at the desk.   Room: 164 Number for report: [phone]   Final next level of care: Skilled Nursing Facility Barriers to Discharge: No Barriers Identified   Patient Goals and CMS Choice   CMS Medicare.gov Compare Post Acute Care list provided to:: Patient Represenative (must comment) Choice offered to / list presented to : Adult Children  Discharge Placement              Patient chooses bed at:  ([facility]) Patient to be transferred to facility by: [place] Name of family member notified: [name] Patient and family notified of of transfer: 05/24/21  Discharge Plan and Services In-house Referral: Clinical Social Work Discharge Planning Services: CM Consult Post Acute Care Choice: [place]                               Social Determinants of Health (SDOH) Interventions     Readmission Risk Interventions Readmission Risk Prevention Plan 06/04/2019  Transportation Screening Complete  PCP or Specialist Appt within 5-7 Days Complete  Home Care Screening Complete  Medication Review (RN CM) Complete  Some recent data might be hidden

## 2021-05-24 NOTE — Discharge Instructions (Signed)
Follow with Primary MD Lucianne Lei, MD in 7 days   Get CBC, CMP, 2 view Chest X ray -  checked next visit within 1 week by SNF MD    Activity: As tolerated with Full fall precautions use Rempel/cane & assistance as needed  Disposition SNF  Diet: Dysphagia 3 diet with feeding assistance and aspiration precautions.  Special Instructions: If you have smoked or chewed Tobacco  in the last 2 yrs please stop smoking, stop any regular Alcohol  and or any Recreational drug use.  On your next visit with your primary care physician please Get Medicines reviewed and adjusted.  Please request your Prim.MD to go over all Hospital Tests and Procedure/Radiological results at the follow up, please get all Hospital records sent to your Prim MD by signing hospital release before you go home.  If you experience worsening of your admission symptoms, develop shortness of breath, life threatening emergency, suicidal or homicidal thoughts you must seek medical attention immediately by calling 911 or calling your MD immediately  if symptoms less severe.  You Must read complete instructions/literature along with all the possible adverse reactions/side effects for all the Medicines you take and that have been prescribed to you. Take any new Medicines after you have completely understood and accpet all the possible adverse reactions/side effects.

## 2021-05-24 NOTE — Progress Notes (Signed)
Pt en route to SNF via [facility].

## 2021-05-24 NOTE — Plan of Care (Signed)
  Problem: Education: Goal: Knowledge of General Education information will improve Description: Including pain rating scale, medication(s)/side effects and non-pharmacologic comfort measures Outcome: Progressing   Problem: Health Behavior/Discharge Planning: Goal: Ability to manage health-related needs will improve Outcome: Progressing   Problem: Clinical Measurements: Goal: Will remain free from infection Outcome: Progressing Goal: Diagnostic test results will improve Outcome: Progressing Goal: Respiratory complications will improve Outcome: Progressing Goal: Cardiovascular complication will be avoided Outcome: Progressing   Problem: Activity: Goal: Risk for activity intolerance will decrease Outcome: Progressing   Problem: Nutrition: Goal: Adequate nutrition will be maintained Outcome: Progressing   Problem: Elimination: Goal: Will not experience complications related to bowel motility Outcome: Progressing Goal: Will not experience complications related to urinary retention Outcome: Progressing   Problem: Pain Managment: Goal: General experience of comfort will improve Outcome: Progressing   Problem: Safety: Goal: Ability to remain free from injury will improve Outcome: Progressing   Problem: Skin Integrity: Goal: Risk for impaired skin integrity will decrease Outcome: Progressing   Problem: Education: Goal: Expressions of having a comfortable level of knowledge regarding the disease process will increase Outcome: Progressing   Problem: Coping: Goal: Ability to adjust to condition or change in health will improve Outcome: Progressing Goal: Ability to identify appropriate support needs will improve Outcome: Progressing   Problem: Health Behavior/Discharge Planning: Goal: Compliance with prescribed medication regimen will improve Outcome: Progressing   Problem: Medication: Goal: Risk for medication side effects will decrease Outcome: Progressing    Problem: Clinical Measurements: Goal: Complications related to the disease process, condition or treatment will be avoided or minimized Outcome: Progressing Goal: Diagnostic test results will improve Outcome: Progressing   Problem: Safety: Goal: Verbalization of understanding the information provided will improve Outcome: Progressing   Problem: Self-Concept: Goal: Level of anxiety will decrease Outcome: Progressing Goal: Ability to verbalize feelings about condition will improve Outcome: Progressing

## 2021-05-24 NOTE — Plan of Care (Signed)
Problem: Education: Goal: Knowledge of General Education information will improve Description: Including pain rating scale, medication(s)/side effects and non-pharmacologic comfort measures 05/24/2021 1541 by Cassell Smiles, RN Outcome: Adequate for Discharge 05/24/2021 1206 by Cassell Smiles, RN Outcome: Adequate for Discharge 05/24/2021 1206 by Cassell Smiles, RN Outcome: Progressing   Problem: Clinical Measurements: Goal: Will remain free from infection 05/24/2021 1541 by Cassell Smiles, RN Outcome: Adequate for Discharge 05/24/2021 1206 by Cassell Smiles, RN Outcome: Adequate for Discharge 05/24/2021 1206 by Cassell Smiles, RN Outcome: Progressing Goal: Diagnostic test results will improve 05/24/2021 1541 by Cassell Smiles, RN Outcome: Adequate for Discharge 05/24/2021 1206 by Cassell Smiles, RN Outcome: Adequate for Discharge 05/24/2021 1206 by Cassell Smiles, RN Outcome: Progressing Goal: Respiratory complications will improve 05/24/2021 1541 by Cassell Smiles, RN Outcome: Adequate for Discharge 05/24/2021 1206 by Cassell Smiles, RN Outcome: Adequate for Discharge 05/24/2021 1206 by Cassell Smiles, RN Outcome: Progressing Goal: Cardiovascular complication will be avoided 05/24/2021 1541 by Cassell Smiles, RN Outcome: Adequate for Discharge 05/24/2021 1206 by Cassell Smiles, RN Outcome: Adequate for Discharge 05/24/2021 1206 by Cassell Smiles, RN Outcome: Progressing   Problem: Activity: Goal: Risk for activity intolerance will decrease 05/24/2021 1541 by Cassell Smiles, RN Outcome: Adequate for Discharge 05/24/2021 1206 by Cassell Smiles, RN Outcome: Adequate for Discharge 05/24/2021 1206 by Cassell Smiles, RN Outcome: Progressing   Problem: Nutrition: Goal: Adequate nutrition will be maintained 05/24/2021 1541 by Cassell Smiles, RN Outcome: Adequate for Discharge 05/24/2021 1206 by Cassell Smiles, RN Outcome: Adequate for  Discharge 05/24/2021 1206 by Cassell Smiles, RN Outcome: Progressing   Problem: Elimination: Goal: Will not experience complications related to bowel motility 05/24/2021 1541 by Cassell Smiles, RN Outcome: Adequate for Discharge 05/24/2021 1206 by Cassell Smiles, RN Outcome: Adequate for Discharge 05/24/2021 1206 by Cassell Smiles, RN Outcome: Progressing Goal: Will not experience complications related to urinary retention 05/24/2021 1541 by Cassell Smiles, RN Outcome: Adequate for Discharge 05/24/2021 1206 by Cassell Smiles, RN Outcome: Adequate for Discharge 05/24/2021 1206 by Cassell Smiles, RN Outcome: Progressing   Problem: Pain Managment: Goal: General experience of comfort will improve 05/24/2021 1541 by Cassell Smiles, RN Outcome: Adequate for Discharge 05/24/2021 1206 by Cassell Smiles, RN Outcome: Adequate for Discharge 05/24/2021 1206 by Cassell Smiles, RN Outcome: Progressing   Problem: Safety: Goal: Ability to remain free from injury will improve 05/24/2021 1541 by Cassell Smiles, RN Outcome: Adequate for Discharge 05/24/2021 1206 by Cassell Smiles, RN Outcome: Adequate for Discharge 05/24/2021 1206 by Cassell Smiles, RN Outcome: Progressing   Problem: Skin Integrity: Goal: Risk for impaired skin integrity will decrease 05/24/2021 1541 by Cassell Smiles, RN Outcome: Adequate for Discharge 05/24/2021 1206 by Cassell Smiles, RN Outcome: Adequate for Discharge 05/24/2021 1206 by Cassell Smiles, RN Outcome: Progressing   Problem: Education: Goal: Expressions of having a comfortable level of knowledge regarding the disease process will increase 05/24/2021 1541 by Cassell Smiles, RN Outcome: Adequate for Discharge 05/24/2021 1206 by Cassell Smiles, RN Outcome: Adequate for Discharge 05/24/2021 1206 by Cassell Smiles, RN Outcome: Progressing   Problem: Coping: Goal: Ability to adjust to condition or change in health will  improve 05/24/2021 1541 by Cassell Smiles, RN Outcome: Adequate for Discharge 05/24/2021 1206 by Cassell Smiles, RN Outcome: Adequate for Discharge 05/24/2021 1206 by Cassell Smiles, RN Outcome:  Progressing Goal: Ability to identify appropriate support needs will improve 05/24/2021 1541 by Cassell Smiles, RN Outcome: Adequate for Discharge 05/24/2021 1206 by Cassell Smiles, RN Outcome: Adequate for Discharge 05/24/2021 1206 by Cassell Smiles, RN Outcome: Progressing   Problem: Health Behavior/Discharge Planning: Goal: Compliance with prescribed medication regimen will improve 05/24/2021 1541 by Cassell Smiles, RN Outcome: Adequate for Discharge 05/24/2021 1206 by Cassell Smiles, RN Outcome: Adequate for Discharge 05/24/2021 1206 by Cassell Smiles, RN Outcome: Progressing   Problem: Medication: Goal: Risk for medication side effects will decrease 05/24/2021 1541 by Cassell Smiles, RN Outcome: Adequate for Discharge 05/24/2021 1206 by Cassell Smiles, RN Outcome: Adequate for Discharge 05/24/2021 1206 by Cassell Smiles, RN Outcome: Progressing   Problem: Clinical Measurements: Goal: Complications related to the disease process, condition or treatment will be avoided or minimized 05/24/2021 1541 by Cassell Smiles, RN Outcome: Adequate for Discharge 05/24/2021 1206 by Cassell Smiles, RN Outcome: Adequate for Discharge 05/24/2021 1206 by Cassell Smiles, RN Outcome: Progressing Goal: Diagnostic test results will improve 05/24/2021 1541 by Cassell Smiles, RN Outcome: Adequate for Discharge 05/24/2021 1206 by Cassell Smiles, RN Outcome: Adequate for Discharge 05/24/2021 1206 by Cassell Smiles, RN Outcome: Progressing   Problem: Safety: Goal: Verbalization of understanding the information provided will improve 05/24/2021 1541 by Cassell Smiles, RN Outcome: Adequate for Discharge 05/24/2021 1206 by Cassell Smiles, RN Outcome: Adequate for  Discharge 05/24/2021 1206 by Cassell Smiles, RN Outcome: Progressing   Problem: Self-Concept: Goal: Level of anxiety will decrease 05/24/2021 1541 by Cassell Smiles, RN Outcome: Adequate for Discharge 05/24/2021 1206 by Cassell Smiles, RN Outcome: Adequate for Discharge 05/24/2021 1206 by Cassell Smiles, RN Outcome: Progressing Goal: Ability to verbalize feelings about condition will improve 05/24/2021 1541 by Cassell Smiles, RN Outcome: Adequate for Discharge 05/24/2021 1206 by Cassell Smiles, RN Outcome: Adequate for Discharge 05/24/2021 1206 by Cassell Smiles, RN Outcome: Progressing

## 2021-05-26 DIAGNOSIS — I10 Essential (primary) hypertension: Secondary | ICD-10-CM | POA: Diagnosis not present

## 2021-05-26 DIAGNOSIS — I639 Cerebral infarction, unspecified: Secondary | ICD-10-CM | POA: Diagnosis not present

## 2021-05-26 DIAGNOSIS — M6282 Rhabdomyolysis: Secondary | ICD-10-CM | POA: Diagnosis not present

## 2021-05-26 DIAGNOSIS — Z7289 Other problems related to lifestyle: Secondary | ICD-10-CM | POA: Diagnosis not present

## 2021-05-26 DIAGNOSIS — G8191 Hemiplegia, unspecified affecting right dominant side: Secondary | ICD-10-CM | POA: Diagnosis not present

## 2021-05-26 DIAGNOSIS — J449 Chronic obstructive pulmonary disease, unspecified: Secondary | ICD-10-CM | POA: Diagnosis not present

## 2021-05-26 DIAGNOSIS — E785 Hyperlipidemia, unspecified: Secondary | ICD-10-CM | POA: Diagnosis not present

## 2021-05-30 ENCOUNTER — Other Ambulatory Visit: Payer: Self-pay | Admitting: Neurosurgery

## 2021-05-30 DIAGNOSIS — I729 Aneurysm of unspecified site: Secondary | ICD-10-CM
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
## 2021-05-31 ENCOUNTER — Ambulatory Visit (INDEPENDENT_AMBULATORY_CARE_PROVIDER_SITE_OTHER): Payer: Medicare Other | Admitting: Neurology

## 2021-05-31 ENCOUNTER — Encounter: Payer: Self-pay | Admitting: Neurology

## 2021-05-31 VITALS — BP 120/71 | HR 72 | Ht 75.0 in | Wt 159.0 lb

## 2021-05-31 DIAGNOSIS — I639 Cerebral infarction, unspecified: Secondary | ICD-10-CM | POA: Diagnosis not present

## 2021-05-31 DIAGNOSIS — Z5181 Encounter for therapeutic drug level monitoring: Secondary | ICD-10-CM | POA: Diagnosis not present

## 2021-05-31 DIAGNOSIS — G40909 Epilepsy, unspecified, not intractable, without status epilepticus: Secondary | ICD-10-CM

## 2021-05-31 DIAGNOSIS — D329 Benign neoplasm of meninges, unspecified: Secondary | ICD-10-CM

## 2021-05-31 DIAGNOSIS — Z0000 Encounter for general adult medical examination without abnormal findings: Secondary | ICD-10-CM | POA: Diagnosis not present

## 2021-05-31 DIAGNOSIS — I720 Aneurysm of carotid artery: Secondary | ICD-10-CM

## 2021-05-31 DIAGNOSIS — D500 Iron deficiency anemia secondary to blood loss (chronic): Secondary | ICD-10-CM | POA: Diagnosis not present
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
## 2021-05-31 NOTE — Patient Instructions (Signed)
Continue with Keppra 1500 mg BID  Continue with Vimpat 100 mg BID  Continue with Phenobarb 64.8 mg BID We will check levels today  Continue with Asa and Plavix Continue all other medications  Follow up with all specialities including Vascular and Neurosurgery  Return in 3 months

## 2021-06-01 ENCOUNTER — Other Ambulatory Visit: Payer: Self-pay

## 2021-06-01 ENCOUNTER — Encounter (HOSPITAL_BASED_OUTPATIENT_CLINIC_OR_DEPARTMENT_OTHER): Payer: Self-pay | Admitting: General Practice

## 2021-06-01 ENCOUNTER — Ambulatory Visit (INDEPENDENT_AMBULATORY_CARE_PROVIDER_SITE_OTHER): Payer: Medicare Other | Admitting: General Practice

## 2021-06-01 VITALS — BP 115/66 | HR 70 | Ht 74.0 in | Wt 145.9 lb

## 2021-06-01 DIAGNOSIS — I10 Essential (primary) hypertension: Secondary | ICD-10-CM

## 2021-06-01 DIAGNOSIS — I4729 Other ventricular tachycardia: Secondary | ICD-10-CM

## 2021-06-01 DIAGNOSIS — I491 Atrial premature depolarization: Secondary | ICD-10-CM | POA: Diagnosis not present

## 2021-06-01 DIAGNOSIS — R002 Palpitations: Secondary | ICD-10-CM

## 2021-06-01 NOTE — Patient Instructions (Addendum)
Medication Instructions:  Your physician recommends that you continue on your current medications as directed. Please refer to the Current Medication list given to you today.  *If you need a refill on your cardiac medications before your next appointment, please call your pharmacy*  Follow-Up: At [facility], you and your health needs are our priority.  As part of our continuing mission to provide you with exceptional heart care, we have created designated Provider Care Teams.  These Care Teams include your primary Cardiologist (physician) and Advanced Practice Providers (APPs -  Physician Assistants and Nurse Practitioners) who all work together to provide you with the care you need, when you need it.  We recommend signing up for the patient portal called "MyChart".  Sign up information is provided on this After Visit Summary.  MyChart is used to connect with patients for Virtual Visits (Telemedicine).  Patients are able to view lab/test results, encounter notes, upcoming appointments, etc.  Non-urgent messages can be sent to your provider as well.   To learn more about what you can do with MyChart, go to [email or web address].    Your next appointment:   3-4 month(s)  The format for your next appointment:   In Person  Provider:   You may see Elouise Munroe, MD or one of the following Advanced Practice Providers on your designated Care Team:   Rosaria Ferries, PA-C Caron Presume, PA-C Jory Sims, DNP, ANP Coletta Memos, NP   Other Instructions  Coletta Memos, NP has recommended avoiding triggers and following a low sodium diet. Please review the information below. Thank you!  Please try to avoid these triggers: Do not use any products that have nicotine or tobacco in them. These include cigarettes, e-cigarettes, and chewing tobacco. If you need help quitting, ask your doctor. Eat heart-healthy foods. Talk with your doctor about the right eating plan for you. Exercise  regularly as told by your doctor. Stay hydrated Do not drink alcohol, Caffeine or chocolate. Lose weight if you are overweight. Do not use drugs, including cannabis  Palpitations Palpitations are feelings that your heartbeat is not normal. Your heartbeat may feel like it is: Uneven. Faster than normal. Fluttering. Skipping a beat. This is usually not a serious problem. In some cases, you may need tests to rule out any serious problems. Follow these instructions at home: Pay attention to any changes in your condition. Take these actions to help manage your symptoms: Eating and drinking Avoid: Coffee, tea, soft drinks, and energy drinks. Chocolate. Alcohol. Diet pills. Lifestyle  Try to lower your stress. These things can help you relax: Yoga. Deep breathing and meditation. Exercise. Using words and images to create positive thoughts (guided imagery). Using your mind to control things in your body (biofeedback). Do not use drugs. Get plenty of rest and sleep. Keep a regular bed time. General instructions  Take over-the-counter and prescription medicines only as told by your doctor. Do not use any products that contain nicotine or tobacco, such as cigarettes and e-cigarettes. If you need help quitting, ask your doctor. Keep all follow-up visits as told by your doctor. This is important. You may need more tests if palpitations do not go away or get worse. Contact a doctor if: Your symptoms last more than 24 hours. Your symptoms occur more often. Get help right away if you: Have chest pain. Feel short of breath. Have a very bad headache. Feel dizzy. Pass out (faint). Summary Palpitations are feelings that your heartbeat is uneven or faster than  normal. It may feel like your heart is fluttering or skipping a beat. Avoid food and drinks that may cause palpitations. These include caffeine, chocolate, and alcohol. Try to lower your stress. Do not smoke or use drugs. Get help  right away if you faint or have chest pain, shortness of breath, a severe headache, or dizziness. This information is not intended to replace advice given to you by your health care provider. Make sure you discuss any questions you have with your health care provider. Document Revised: 12/10/2020 Document Reviewed: 09/26/2017 Elsevier Patient Education  2022 [employer].

## 2021-06-02 DIAGNOSIS — I639 Cerebral infarction, unspecified: Secondary | ICD-10-CM | POA: Diagnosis not present

## 2021-06-02 LAB — LACOSAMIDE: Lacosamide: 3.7 ug/mL — ABNORMAL LOW (ref 5.0–10.0)

## 2021-06-02 LAB — LEVETIRACETAM LEVEL: Levetiracetam Lvl: 77.8 ug/mL — ABNORMAL HIGH (ref 10.0–40.0)

## 2021-06-02 LAB — PHENOBARBITAL LEVEL: Phenobarbital, Serum: 22 ug/mL (ref 15–40)

## 2021-06-13 DIAGNOSIS — R202 Paresthesia of skin: Secondary | ICD-10-CM | POA: Diagnosis not present

## 2021-06-13 DIAGNOSIS — G8191 Hemiplegia, unspecified affecting right dominant side: Secondary | ICD-10-CM | POA: Diagnosis not present

## 2021-06-13 DIAGNOSIS — G629 Polyneuropathy, unspecified: Secondary | ICD-10-CM | POA: Diagnosis not present

## 2021-06-23 ENCOUNTER — Ambulatory Visit
Admission: RE | Admit: 2021-06-23 | Discharge: 2021-06-23 | Disposition: A | Discharge status: Home / Self Care | Payer: Medicare Other | Source: Ambulatory Visit | Attending: Neurosurgery | Admitting: Neurosurgery

## 2021-06-23 ENCOUNTER — Other Ambulatory Visit: Payer: Self-pay

## 2021-06-23 DIAGNOSIS — I729 Aneurysm of unspecified site: Secondary | ICD-10-CM

## 2021-06-27 DIAGNOSIS — E46 Unspecified protein-calorie malnutrition: Secondary | ICD-10-CM | POA: Diagnosis not present

## 2021-06-27 DIAGNOSIS — M869 Osteomyelitis, unspecified: Secondary | ICD-10-CM | POA: Diagnosis not present

## 2021-06-27 DIAGNOSIS — J449 Chronic obstructive pulmonary disease, unspecified: Secondary | ICD-10-CM | POA: Diagnosis not present

## 2021-06-27 DIAGNOSIS — I10 Essential (primary) hypertension: Secondary | ICD-10-CM | POA: Diagnosis not present

## 2021-06-27 DIAGNOSIS — U071 COVID-19: Secondary | ICD-10-CM | POA: Diagnosis not present

## 2021-06-27 DIAGNOSIS — R531 Weakness: Secondary | ICD-10-CM | POA: Diagnosis not present

## 2021-06-29 ENCOUNTER — Other Ambulatory Visit: Payer: Self-pay | Admitting: Neurosurgery

## 2021-06-29 DIAGNOSIS — I878 Other specified disorders of veins: Secondary | ICD-10-CM

## 2021-07-14 DIAGNOSIS — G8191 Hemiplegia, unspecified affecting right dominant side: Secondary | ICD-10-CM | POA: Diagnosis not present

## 2021-07-14 DIAGNOSIS — R52 Pain, unspecified: Secondary | ICD-10-CM | POA: Diagnosis not present

## 2021-07-14 DIAGNOSIS — G629 Polyneuropathy, unspecified: Secondary | ICD-10-CM | POA: Diagnosis not present

## 2021-07-18 DIAGNOSIS — M79642 Pain in left hand: Secondary | ICD-10-CM | POA: Diagnosis not present

## 2021-07-18 DIAGNOSIS — M19042 Primary osteoarthritis, left hand: Secondary | ICD-10-CM | POA: Diagnosis not present

## 2021-07-18 DIAGNOSIS — W19XXXA Unspecified fall, initial encounter: Secondary | ICD-10-CM | POA: Diagnosis not present

## 2021-07-18 DIAGNOSIS — M47816 Spondylosis without myelopathy or radiculopathy, lumbar region: Secondary | ICD-10-CM | POA: Diagnosis not present

## 2021-07-18 DIAGNOSIS — G8191 Hemiplegia, unspecified affecting right dominant side: Secondary | ICD-10-CM | POA: Diagnosis not present

## 2021-07-18 DIAGNOSIS — M545 Low back pain, unspecified: Secondary | ICD-10-CM | POA: Diagnosis not present

## 2021-07-19 ENCOUNTER — Ambulatory Visit
Admission: RE | Admit: 2021-07-19 | Discharge: 2021-07-19 | Disposition: A | Discharge status: Home / Self Care | Payer: Medicare Other | Source: Ambulatory Visit | Attending: Neurosurgery | Admitting: Neurosurgery

## 2021-07-19 ENCOUNTER — Other Ambulatory Visit: Payer: Self-pay | Admitting: Neurosurgery

## 2021-07-19 DIAGNOSIS — I6503 Occlusion and stenosis of bilateral vertebral arteries: Secondary | ICD-10-CM | POA: Diagnosis not present

## 2021-07-19 DIAGNOSIS — I729 Aneurysm of unspecified site: Secondary | ICD-10-CM

## 2021-07-19 DIAGNOSIS — Z452 Encounter for adjustment and management of vascular access device: Secondary | ICD-10-CM | POA: Diagnosis not present

## 2021-07-19 DIAGNOSIS — I6523 Occlusion and stenosis of bilateral carotid arteries: Secondary | ICD-10-CM | POA: Diagnosis not present

## 2021-07-19 DIAGNOSIS — I672 Cerebral atherosclerosis: Secondary | ICD-10-CM | POA: Diagnosis not present

## 2021-07-19 DIAGNOSIS — Q283 Other malformations of cerebral vessels: Secondary | ICD-10-CM | POA: Diagnosis not present

## 2021-07-19 DIAGNOSIS — I878 Other specified disorders of veins: Secondary | ICD-10-CM

## 2021-07-19 HISTORY — PX: IR US GUIDE VASC ACCESS LEFT: IMG2389

## 2021-07-19 IMAGING — US IR US GUIDE VASC ACCESS LEFT
1 series · 1 of 1 positions shown · non-contrast
Comparison: none

INDICATION: Poor venous access prior to CT

[Series 1: ir us guide vasc access left · 0.05mm/px · 1 of 1 slices shown]
[im 1/1]
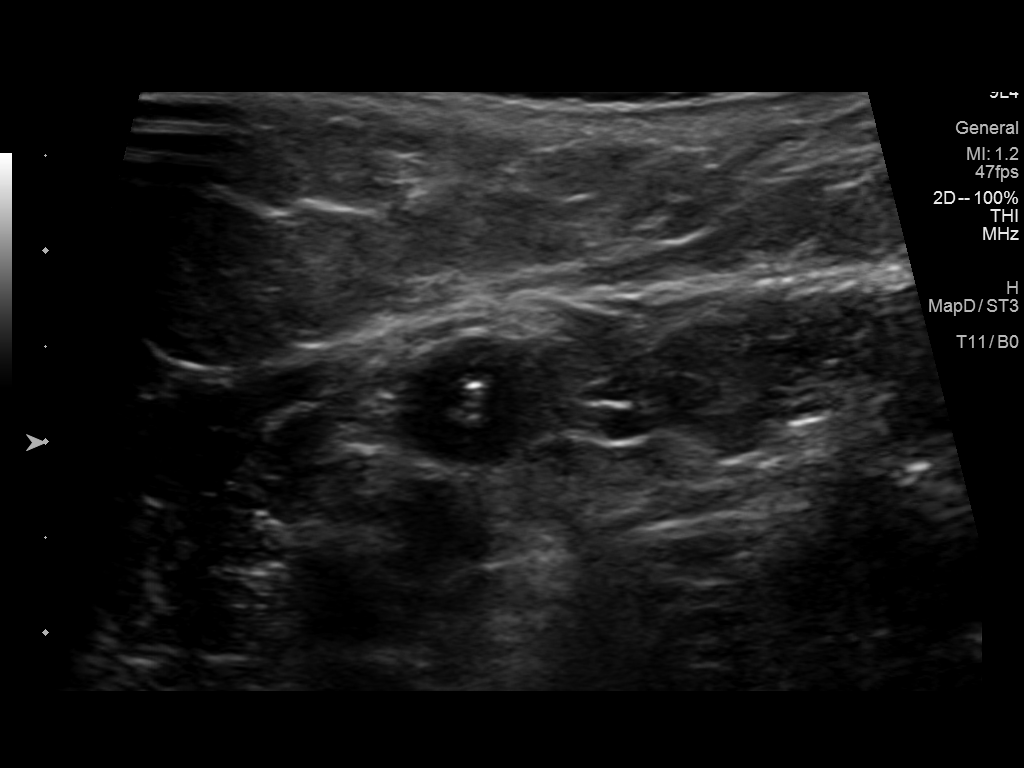

[1 of 1 positions shown; findings below may reference images not displayed]

EXAM:
Ultrasound-guided puncture a right upper extremity vein

Placement of a midline catheter

MEDICATIONS:
None

ANESTHESIA/SEDATION:
None

FLUOROSCOPY TIME:  N/a

COMPLICATIONS:
None immediate.

PROCEDURE:
Informed written consent was obtained from the patient after a
thorough discussion of the procedural risks, benefits and
alternatives. All questions were addressed. Maximal Sterile Barrier
Technique was utilized including caps, mask, sterile gowns, sterile
gloves, sterile drape, hand hygiene and skin antiseptic. A timeout
was performed prior to the initiation of the procedure.

The patient was placed supine on the exam table. The right upper
extremity was prepped and draped in a standard sterile fashion.
Ultrasound was used to evaluate the right upper extremity veins,
which demonstrated a widely patent right brachial vein. Skin entry
site was marked, and local analgesia was obtained with 1% lidocaine.
Using ultrasound guidance, the identified upper extremity vein was
directly punctured using a 21 gauge micropuncture set with
visualization of needle entry into the vein. An 018 wire was easily
advanced towards the central veins. A 5 French transition dilator
was advanced over the wire into the vein. The catheter was found to
aspirate and flush appropriately. It was secured to the skin and a
sterile dressing was placed. The patient tolerated the procedure
well without immediate complication.
IMPRESSION: Successful ultrasound-guided placement of a right upper extremity
midline catheter using the right brachial vein.

## 2021-07-19 IMAGING — CT CT ANGIO NECK
2 of 7 series · 6 of 33 positions shown · IV contrast (iopamidol)
Comparison: CT angiogram of the head and [DATE].

CLINICAL DATA: Pseudoaneurysm.

EXAM:
CT ANGIOGRAPHY NECK
TECHNIQUE: Multidetector CT imaging of the neck was performed using the
standard protocol during bolus administration of intravenous
contrast. Multiplanar CT image reconstructions and MIPs were
obtained to evaluate the vascular anatomy. Carotid stenosis
measurements (when applicable) are obtained utilizing NASCET
criteria, using the distal internal carotid diameter as the
denominator.
CONTRAST:  75mL [W[place]] IOPAMIDOL ([W[place]]) INJECTION 76%

[Series 13: cta neck 1.00 bv48 s3 ax thin mips · axial · 0.54mm/px · z∈[-846,-660]mm · 5 of 280 slices shown]
[im 47/280  soft-tissue]
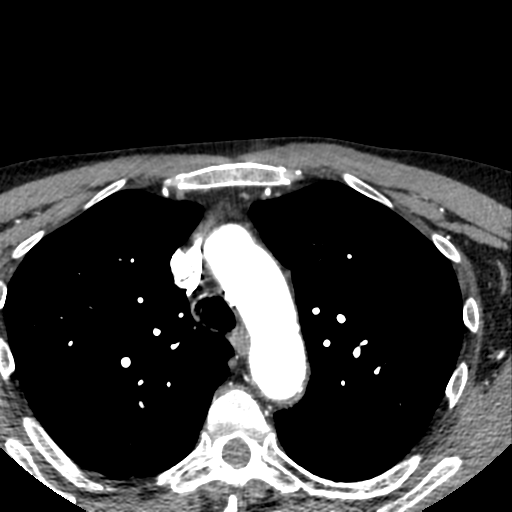
[im 94/280  bone]
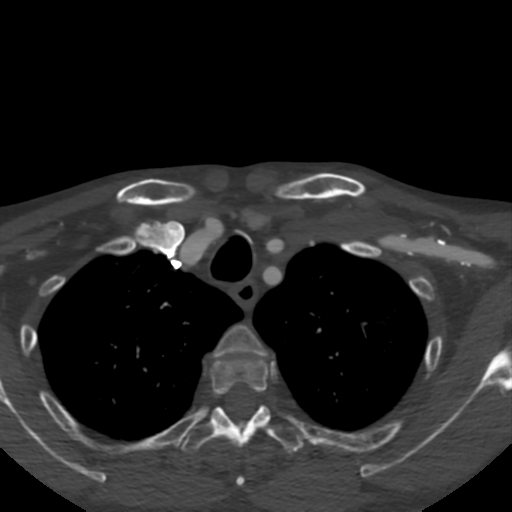
[im 140/280  soft-tissue]
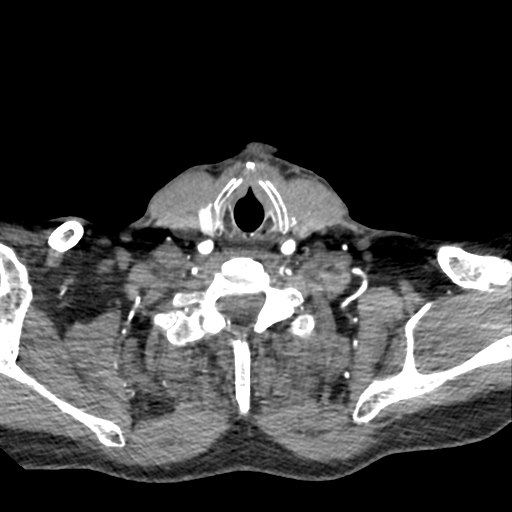
[im 187/280  bone]
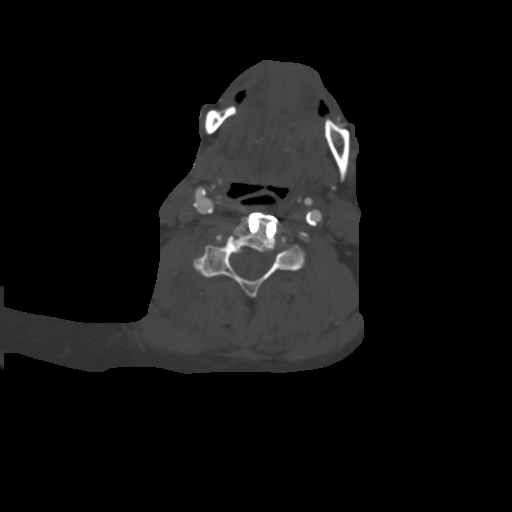
[im 233/280  soft-tissue]
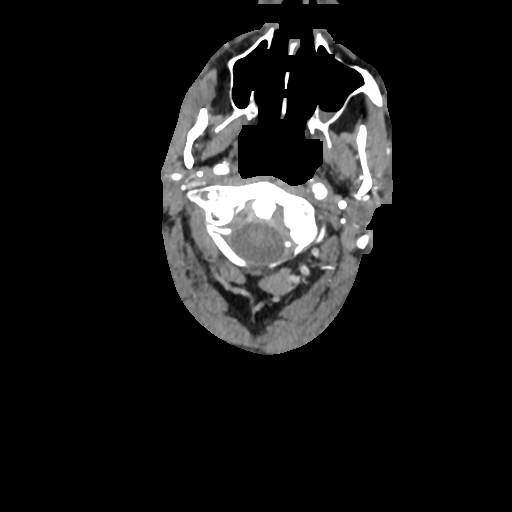

[Series 17: cta neck 1.00 bv48 s3 sag thin mips · sagittal · 0.54mm/px · 1 of 277 slices shown]
[im 139/277  soft-tissue]
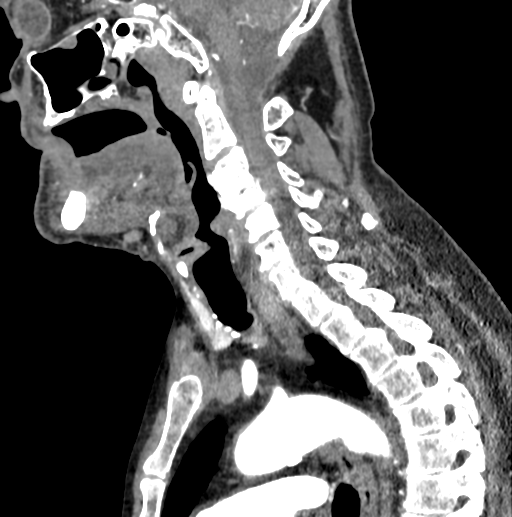

[6 of 33 positions shown; findings below may reference images not displayed]

FINDINGS: Aortic arch: Atherosclerotic changes of the aortic arch without
hemodynamically significant stenosis. There is common origin of the
innominate left common carotid artery from the arch.

Right carotid system: Atherosclerotic changes noted along the right
common carotid artery without hemodynamically significant stenosis.
There also atherosclerotic changes at the right carotid bifurcation
with predominantly calcified plaques without hemodynamically
significant stenosis. No significant interval change of the smooth
narrowing of the cervical right ICA with hypodense outpouching with
peripheral calcification suggestive of thrombosed pseudoaneurysm
resulting in approximately 35% stenosis. The thrombosed pouch
measures approximately 12 x 6 mm.

Left carotid system: Atherosclerotic hikes are seen along the left
common carotid artery without hemodynamically significant stenosis.
There also atherosclerotic changes of the left carotid bifurcation
with predominantly calcified plaques without hemodynamically
significant stenosis. No significant interval change of the
partially thrombosed pseudoaneurysm in the upper cervical segment of
the left ICA measuring approximately 1.7 x 1.4 cm, with residual
aneurysm filling in its superior aspect. The pseudoaneurysm causes
mass effect on the true lumen resulting in approximately 50%
stenosis.

Vertebral arteries: Atherosclerotic changes the origin of the right
vertebral artery resulting in mild stenosis. Both vertebral arteries
are small in caliber throughout its entire course. Visualized
portion of basilar artery is also small in caliber. Correlation with
prior study suggest hypoplastic vertebrobasilar system related to
the presence of bilateral fetal PCAs.

Skeleton: Mild compression fractures of T4 and T5 appear stable.
ACDF at C3-4. No acute findings.

Other neck: Negative.

Upper chest: Centrilobular emphysema. Stable appearance of 6 mm
subpleural right upper lobe nodule.
IMPRESSION: 1. No significant interval change of the bilateral cervical internal
carotid arteries pseudoaneurysms with mass effect on the true lumen
resulting in approximately 35% stenosis on the right and 50%
stenosis on the left. There is small residual filling the superior
aspect of the left pseudoaneurysm, unchanged from prior.
2. Atherosclerotic changes of the bilateral carotid bifurcations
without hemodynamically significant stenosis.
3. Atherosclerotic changes at the origin of the right vertebral
artery resulting in mild stenosis. The vertebrobasilar system is
hypoplastic related to the presence of bilateral fetal PCA.
4. Stable appearance of T4 and T5 mild superior endplate compression
fractures.
5. Stable appearance of 6 mm right pulmonary nodule.

## 2021-07-19 MED ORDER — IOPAMIDOL (ISOVUE-370) INJECTION 76%
75.0000 mL | Freq: Once | INTRAVENOUS | Status: AC | PRN
  Administered 2021-07-19: 75 mL via INTRAVENOUS

## 2021-07-27 DIAGNOSIS — R531 Weakness: Secondary | ICD-10-CM | POA: Diagnosis not present

## 2021-07-27 DIAGNOSIS — M869 Osteomyelitis, unspecified: Secondary | ICD-10-CM | POA: Diagnosis not present

## 2021-07-27 DIAGNOSIS — U071 COVID-19: Secondary | ICD-10-CM | POA: Diagnosis not present

## 2021-07-28 ENCOUNTER — Ambulatory Visit (INDEPENDENT_AMBULATORY_CARE_PROVIDER_SITE_OTHER): Payer: Medicare Other | Admitting: Internal Medicine

## 2021-07-28 ENCOUNTER — Other Ambulatory Visit: Payer: Self-pay

## 2021-07-28 ENCOUNTER — Encounter: Payer: Self-pay | Admitting: Internal Medicine

## 2021-07-28 VITALS — BP 120/80 | HR 64 | Temp 98.4°F | Ht 74.0 in | Wt 170.0 lb

## 2021-07-28 DIAGNOSIS — I639 Cerebral infarction, unspecified: Secondary | ICD-10-CM | POA: Diagnosis not present

## 2021-07-28 DIAGNOSIS — S065X0S Traumatic subdural hemorrhage without loss of consciousness, sequela: Secondary | ICD-10-CM | POA: Diagnosis not present

## 2021-07-28 DIAGNOSIS — G9340 Encephalopathy, unspecified: Secondary | ICD-10-CM | POA: Diagnosis not present

## 2021-07-28 DIAGNOSIS — S06300A Unspecified focal traumatic brain injury without loss of consciousness, initial encounter: Secondary | ICD-10-CM | POA: Diagnosis not present

## 2021-07-28 DIAGNOSIS — F17200 Nicotine dependence, unspecified, uncomplicated: Secondary | ICD-10-CM | POA: Diagnosis not present

## 2021-07-28 DIAGNOSIS — M6281 Muscle weakness (generalized): Secondary | ICD-10-CM | POA: Diagnosis not present

## 2021-07-28 DIAGNOSIS — R278 Other lack of coordination: Secondary | ICD-10-CM | POA: Diagnosis not present

## 2021-07-28 DIAGNOSIS — Z23 Encounter for immunization: Secondary | ICD-10-CM

## 2021-07-28 DIAGNOSIS — I10 Essential (primary) hypertension: Secondary | ICD-10-CM | POA: Diagnosis not present

## 2021-07-28 DIAGNOSIS — J449 Chronic obstructive pulmonary disease, unspecified: Secondary | ICD-10-CM | POA: Diagnosis not present

## 2021-07-28 DIAGNOSIS — R531 Weakness: Secondary | ICD-10-CM | POA: Diagnosis not present

## 2021-07-28 DIAGNOSIS — N179 Acute kidney failure, unspecified: Secondary | ICD-10-CM | POA: Diagnosis not present

## 2021-07-28 DIAGNOSIS — J439 Emphysema, unspecified: Secondary | ICD-10-CM

## 2021-07-28 DIAGNOSIS — L89159 Pressure ulcer of sacral region, unspecified stage: Secondary | ICD-10-CM | POA: Diagnosis not present

## 2021-07-28 DIAGNOSIS — R296 Repeated falls: Secondary | ICD-10-CM | POA: Diagnosis not present

## 2021-07-28 DIAGNOSIS — E440 Moderate protein-calorie malnutrition: Secondary | ICD-10-CM | POA: Diagnosis not present

## 2021-07-28 DIAGNOSIS — K2100 Gastro-esophageal reflux disease with esophagitis, without bleeding: Secondary | ICD-10-CM | POA: Diagnosis not present

## 2021-07-28 DIAGNOSIS — I739 Peripheral vascular disease, unspecified: Secondary | ICD-10-CM | POA: Diagnosis not present

## 2021-07-28 DIAGNOSIS — R6889 Other general symptoms and signs: Secondary | ICD-10-CM | POA: Diagnosis not present

## 2021-07-28 DIAGNOSIS — R1310 Dysphagia, unspecified: Secondary | ICD-10-CM | POA: Diagnosis not present

## 2021-07-28 DIAGNOSIS — E785 Hyperlipidemia, unspecified: Secondary | ICD-10-CM | POA: Diagnosis not present

## 2021-07-28 DIAGNOSIS — R2689 Other abnormalities of gait and mobility: Secondary | ICD-10-CM | POA: Diagnosis not present

## 2021-07-28 DIAGNOSIS — F32A Depression, unspecified: Secondary | ICD-10-CM | POA: Diagnosis not present

## 2021-07-28 DIAGNOSIS — M869 Osteomyelitis, unspecified: Secondary | ICD-10-CM | POA: Diagnosis not present

## 2021-07-28 DIAGNOSIS — U071 COVID-19: Secondary | ICD-10-CM | POA: Diagnosis not present

## 2021-07-28 DIAGNOSIS — I69351 Hemiplegia and hemiparesis following cerebral infarction affecting right dominant side: Secondary | ICD-10-CM | POA: Diagnosis not present

## 2021-07-28 NOTE — Patient Instructions (Addendum)
Please schedule follow up scheduled with myself in 2 months.  If my schedule is not open yet, we will contact you with a reminder closer to that time.  Before your next visit I would like you to have:  Full set of PFTs - 1 hour before next visit.   Continue spiriva and advair. Continue prn albuterol. Try Breztri instead of spiriva/advair.  Stop smoking.  Referring for lung cancer screening program.   Flu shot and PCV 20 given today.
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
## 2021-08-01 DIAGNOSIS — J449 Chronic obstructive pulmonary disease, unspecified: Secondary | ICD-10-CM | POA: Diagnosis not present

## 2021-08-02 DIAGNOSIS — R6889 Other general symptoms and signs: Secondary | ICD-10-CM | POA: Diagnosis not present

## 2021-08-05 DIAGNOSIS — I10 Essential (primary) hypertension: Secondary | ICD-10-CM | POA: Diagnosis not present

## 2021-08-10 ENCOUNTER — Other Ambulatory Visit: Payer: Self-pay

## 2021-08-10 DIAGNOSIS — I739 Peripheral vascular disease, unspecified: Secondary | ICD-10-CM

## 2021-08-24 ENCOUNTER — Ambulatory Visit (INDEPENDENT_AMBULATORY_CARE_PROVIDER_SITE_OTHER)
Admission: RE | Admit: 2021-08-24 | Discharge: 2021-08-24 | Disposition: A | Discharge status: Home / Self Care | Payer: Medicare Other | Source: Ambulatory Visit | Attending: Physician Assistant | Admitting: Physician Assistant

## 2021-08-24 ENCOUNTER — Ambulatory Visit (HOSPITAL_COMMUNITY)
Admission: RE | Admit: 2021-08-24 | Discharge: 2021-08-24 | Disposition: A | Discharge status: Home / Self Care | Payer: Medicare Other | Source: Ambulatory Visit | Attending: Physician Assistant | Admitting: Physician Assistant

## 2021-08-24 ENCOUNTER — Other Ambulatory Visit: Payer: Self-pay

## 2021-08-24 ENCOUNTER — Ambulatory Visit (INDEPENDENT_AMBULATORY_CARE_PROVIDER_SITE_OTHER): Payer: Medicare Other | Admitting: Physician Assistant

## 2021-08-24 VITALS — BP 117/71 | HR 62 | Temp 98.5°F | Resp 20 | Ht 74.0 in

## 2021-08-24 DIAGNOSIS — I739 Peripheral vascular disease, unspecified: Secondary | ICD-10-CM
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
## 2021-08-25 ENCOUNTER — Other Ambulatory Visit: Payer: Self-pay

## 2021-08-25 DIAGNOSIS — I739 Peripheral vascular disease, unspecified: Secondary | ICD-10-CM

## 2021-08-27 DIAGNOSIS — R531 Weakness: Secondary | ICD-10-CM | POA: Diagnosis not present

## 2021-08-27 DIAGNOSIS — U071 COVID-19: Secondary | ICD-10-CM | POA: Diagnosis not present

## 2021-08-27 DIAGNOSIS — M869 Osteomyelitis, unspecified: Secondary | ICD-10-CM | POA: Diagnosis not present

## 2021-08-29 DIAGNOSIS — R5381 Other malaise: Secondary | ICD-10-CM | POA: Diagnosis not present

## 2021-08-29 DIAGNOSIS — R350 Frequency of micturition: Secondary | ICD-10-CM | POA: Diagnosis not present

## 2021-08-29 DIAGNOSIS — J449 Chronic obstructive pulmonary disease, unspecified: Secondary | ICD-10-CM | POA: Diagnosis not present

## 2021-08-29 DIAGNOSIS — I10 Essential (primary) hypertension: Secondary | ICD-10-CM | POA: Diagnosis not present

## 2021-08-31 DIAGNOSIS — E440 Moderate protein-calorie malnutrition: Secondary | ICD-10-CM | POA: Diagnosis not present

## 2021-08-31 DIAGNOSIS — N179 Acute kidney failure, unspecified: Secondary | ICD-10-CM | POA: Diagnosis not present

## 2021-08-31 DIAGNOSIS — I10 Essential (primary) hypertension: Secondary | ICD-10-CM | POA: Diagnosis not present

## 2021-08-31 DIAGNOSIS — R3915 Urgency of urination: Secondary | ICD-10-CM | POA: Diagnosis not present

## 2021-08-31 DIAGNOSIS — D500 Iron deficiency anemia secondary to blood loss (chronic): Secondary | ICD-10-CM | POA: Diagnosis not present

## 2021-09-01 ENCOUNTER — Other Ambulatory Visit: Payer: Self-pay

## 2021-09-01 ENCOUNTER — Ambulatory Visit (INDEPENDENT_AMBULATORY_CARE_PROVIDER_SITE_OTHER): Payer: Commercial Managed Care - HMO | Admitting: Neurology

## 2021-09-01 ENCOUNTER — Encounter: Payer: Self-pay | Admitting: Neurology

## 2021-09-01 VITALS — BP 112/68 | HR 71

## 2021-09-01 DIAGNOSIS — I639 Cerebral infarction, unspecified: Secondary | ICD-10-CM

## 2021-09-01 DIAGNOSIS — D329 Benign neoplasm of meninges, unspecified: Secondary | ICD-10-CM | POA: Diagnosis not present

## 2021-09-01 DIAGNOSIS — G40909 Epilepsy, unspecified, not intractable, without status epilepticus: Secondary | ICD-10-CM

## 2021-09-01 DIAGNOSIS — R739 Hyperglycemia, unspecified: Secondary | ICD-10-CM | POA: Diagnosis not present

## 2021-09-01 MED ORDER — PHENOBARBITAL 32.4 MG PO TABS
ORAL_TABLET | ORAL | 0 refills | Status: DC
Start: 2021-09-01 — End: 2021-09-01

## 2021-09-01 MED ORDER — PHENOBARBITAL 32.4 MG PO TABS
32.4000 mg | ORAL_TABLET | Freq: Two times a day (BID) | ORAL | 0 refills | Status: DC
Start: 2021-09-30 — End: 2021-10-18

## 2021-09-01 MED ORDER — PHENOBARBITAL 32.4 MG PO TABS
ORAL_TABLET | ORAL | 0 refills | Status: DC
Start: 2021-09-01 — End: 2022-05-29

## 2021-09-01 NOTE — Patient Instructions (Signed)
Continue with Keppra 1500 mg BID  Continue with Vimpat 100 mg BID  Plan to taper Phenobarb   Decrease to 32.4 mg in the morning and 68.4 evening for one month   Then Decrease to 32.4 mg twice daily for one month  Then call the office in the beginning of March for new prescription. Plan at that time is to taper the Phenobarb by 25% monthly.  Please call the office if you have a seizure  Return in 6 months for follow up
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
## 2021-09-07 ENCOUNTER — Ambulatory Visit (INDEPENDENT_AMBULATORY_CARE_PROVIDER_SITE_OTHER): Payer: Commercial Managed Care - HMO | Admitting: General Practice

## 2021-09-07 ENCOUNTER — Encounter (HOSPITAL_BASED_OUTPATIENT_CLINIC_OR_DEPARTMENT_OTHER): Payer: Self-pay | Admitting: General Practice

## 2021-09-07 ENCOUNTER — Other Ambulatory Visit: Payer: Self-pay

## 2021-09-07 VITALS — BP 115/68 | HR 72 | Ht 75.0 in | Wt 219.1 lb

## 2021-09-07 DIAGNOSIS — Z8673 Personal history of transient ischemic attack (TIA), and cerebral infarction without residual deficits: Secondary | ICD-10-CM

## 2021-09-07 DIAGNOSIS — I491 Atrial premature depolarization: Secondary | ICD-10-CM | POA: Diagnosis not present

## 2021-09-07 DIAGNOSIS — I10 Essential (primary) hypertension: Secondary | ICD-10-CM

## 2021-09-07 DIAGNOSIS — I4729 Other ventricular tachycardia: Secondary | ICD-10-CM

## 2021-09-07 DIAGNOSIS — R002 Palpitations: Secondary | ICD-10-CM | POA: Diagnosis not present

## 2021-09-07 NOTE — Patient Instructions (Signed)

## 2021-09-12 DIAGNOSIS — Z79899 Other long term (current) drug therapy: Secondary | ICD-10-CM | POA: Diagnosis not present

## 2021-09-19 DIAGNOSIS — M25512 Pain in left shoulder: Secondary | ICD-10-CM | POA: Diagnosis not present

## 2021-09-19 DIAGNOSIS — M545 Low back pain, unspecified: Secondary | ICD-10-CM | POA: Diagnosis not present

## 2021-09-21 DIAGNOSIS — I10 Essential (primary) hypertension: Secondary | ICD-10-CM | POA: Diagnosis not present

## 2021-09-21 DIAGNOSIS — N183 Chronic kidney disease, stage 3 unspecified: Secondary | ICD-10-CM | POA: Diagnosis not present

## 2021-09-21 DIAGNOSIS — Z5181 Encounter for therapeutic drug level monitoring: Secondary | ICD-10-CM | POA: Diagnosis not present

## 2021-09-21 DIAGNOSIS — E0800 Diabetes mellitus due to underlying condition with hyperosmolarity without nonketotic hyperglycemic-hyperosmolar coma (NKHHC): Secondary | ICD-10-CM | POA: Diagnosis not present

## 2021-09-21 DIAGNOSIS — D508 Other iron deficiency anemias: Secondary | ICD-10-CM | POA: Diagnosis not present

## 2021-09-21 DIAGNOSIS — D519 Vitamin B12 deficiency anemia, unspecified: Secondary | ICD-10-CM | POA: Diagnosis not present

## 2021-09-27 DIAGNOSIS — R531 Weakness: Secondary | ICD-10-CM | POA: Diagnosis not present

## 2021-09-27 DIAGNOSIS — U071 COVID-19: Secondary | ICD-10-CM | POA: Diagnosis not present

## 2021-09-27 DIAGNOSIS — M869 Osteomyelitis, unspecified: Secondary | ICD-10-CM | POA: Diagnosis not present

## 2021-09-28 ENCOUNTER — Other Ambulatory Visit: Payer: Self-pay

## 2021-09-28 ENCOUNTER — Encounter: Payer: Self-pay | Admitting: Internal Medicine

## 2021-09-28 ENCOUNTER — Ambulatory Visit (INDEPENDENT_AMBULATORY_CARE_PROVIDER_SITE_OTHER): Payer: Medicare Other | Admitting: Internal Medicine

## 2021-09-28 VITALS — BP 124/68 | HR 69 | Temp 98.8°F | Ht 75.0 in | Wt 169.0 lb

## 2021-09-28 DIAGNOSIS — J4489 Other specified chronic obstructive pulmonary disease: Secondary | ICD-10-CM

## 2021-09-28 DIAGNOSIS — F17200 Nicotine dependence, unspecified, uncomplicated: Secondary | ICD-10-CM

## 2021-09-28 DIAGNOSIS — F17210 Nicotine dependence, cigarettes, uncomplicated: Secondary | ICD-10-CM | POA: Diagnosis not present

## 2021-09-28 DIAGNOSIS — R911 Solitary pulmonary nodule: Secondary | ICD-10-CM | POA: Diagnosis not present

## 2021-09-28 DIAGNOSIS — W19XXXA Unspecified fall, initial encounter: Secondary | ICD-10-CM | POA: Diagnosis not present

## 2021-09-28 DIAGNOSIS — J449 Chronic obstructive pulmonary disease, unspecified: Secondary | ICD-10-CM | POA: Diagnosis not present

## 2021-09-28 DIAGNOSIS — G8191 Hemiplegia, unspecified affecting right dominant side: Secondary | ICD-10-CM | POA: Diagnosis not present

## 2021-09-28 DIAGNOSIS — R531 Weakness: Secondary | ICD-10-CM | POA: Diagnosis not present

## 2021-09-28 LAB — PULMONARY FUNCTION TEST
DL/VA % pred: 61 %
DL/VA: 2.52 ml/min/mmHg/L
DLCO cor % pred: 43 %
DLCO cor: 13.73 ml/min/mmHg
DLCO unc % pred: 43 %
DLCO unc: 13.73 ml/min/mmHg

## 2021-09-28 NOTE — Progress Notes (Signed)
Patient unable to complete spirometry;pleth and N2 correctly.DLCO performed.

## 2021-09-28 NOTE — Patient Instructions (Signed)
Please schedule follow up scheduled with myself in 6 months.  If my schedule is not open yet, we will contact you with a reminder closer to that time. Please call [phone] if you haven't heard from [place] a month before.   For COPD.
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
## 2021-09-28 NOTE — Patient Instructions (Signed)
DLCO performed today. 

## 2021-10-04 DIAGNOSIS — L97522 Non-pressure chronic ulcer of other part of left foot with fat layer exposed: Secondary | ICD-10-CM | POA: Diagnosis not present

## 2021-10-05 ENCOUNTER — Ambulatory Visit: Payer: Commercial Managed Care - HMO | Admitting: Family

## 2021-10-05 DIAGNOSIS — W19XXXA Unspecified fall, initial encounter: Secondary | ICD-10-CM | POA: Diagnosis not present

## 2021-10-05 DIAGNOSIS — M6281 Muscle weakness (generalized): Secondary | ICD-10-CM | POA: Diagnosis not present

## 2021-10-05 DIAGNOSIS — R531 Weakness: Secondary | ICD-10-CM | POA: Diagnosis not present

## 2021-10-05 DIAGNOSIS — Z4781 Encounter for orthopedic aftercare following surgical amputation: Secondary | ICD-10-CM | POA: Diagnosis not present

## 2021-10-05 DIAGNOSIS — I10 Essential (primary) hypertension: Secondary | ICD-10-CM | POA: Diagnosis not present

## 2021-10-09 DIAGNOSIS — S91302A Unspecified open wound, left foot, initial encounter: Secondary | ICD-10-CM | POA: Diagnosis not present

## 2021-10-10 ENCOUNTER — Other Ambulatory Visit: Payer: Self-pay

## 2021-10-10 ENCOUNTER — Inpatient Hospital Stay (HOSPITAL_COMMUNITY)
Admission: EM | Admit: 2021-10-10 | Discharge: 2021-10-18 | DRG: 475 | Disposition: A | Discharge status: Skilled Nursing Facility | Payer: Medicare Other | Attending: Student | Admitting: Student

## 2021-10-10 ENCOUNTER — Encounter (HOSPITAL_COMMUNITY): Payer: Self-pay

## 2021-10-10 ENCOUNTER — Emergency Department (HOSPITAL_COMMUNITY): Payer: Medicare Other

## 2021-10-10 DIAGNOSIS — G40109 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, not intractable, without status epilepticus: Secondary | ICD-10-CM | POA: Diagnosis present

## 2021-10-10 DIAGNOSIS — M7989 Other specified soft tissue disorders: Secondary | ICD-10-CM | POA: Diagnosis not present

## 2021-10-10 DIAGNOSIS — Z7951 Long term (current) use of inhaled steroids: Secondary | ICD-10-CM

## 2021-10-10 DIAGNOSIS — I69351 Hemiplegia and hemiparesis following cerebral infarction affecting right dominant side: Secondary | ICD-10-CM

## 2021-10-10 DIAGNOSIS — M01X72 Direct infection of left ankle and foot in infectious and parasitic diseases classified elsewhere: Secondary | ICD-10-CM | POA: Diagnosis not present

## 2021-10-10 DIAGNOSIS — M6281 Muscle weakness (generalized): Secondary | ICD-10-CM | POA: Diagnosis not present

## 2021-10-10 DIAGNOSIS — Z89432 Acquired absence of left foot: Secondary | ICD-10-CM | POA: Diagnosis not present

## 2021-10-10 DIAGNOSIS — Z635 Disruption of family by separation and divorce: Secondary | ICD-10-CM

## 2021-10-10 DIAGNOSIS — L89159 Pressure ulcer of sacral region, unspecified stage: Secondary | ICD-10-CM | POA: Diagnosis not present

## 2021-10-10 DIAGNOSIS — D638 Anemia in other chronic diseases classified elsewhere: Secondary | ICD-10-CM | POA: Diagnosis present

## 2021-10-10 DIAGNOSIS — F17200 Nicotine dependence, unspecified, uncomplicated: Secondary | ICD-10-CM | POA: Diagnosis present

## 2021-10-10 DIAGNOSIS — S06300A Unspecified focal traumatic brain injury without loss of consciousness, initial encounter: Secondary | ICD-10-CM | POA: Diagnosis not present

## 2021-10-10 DIAGNOSIS — E871 Hypo-osmolality and hyponatremia: Secondary | ICD-10-CM | POA: Diagnosis not present

## 2021-10-10 DIAGNOSIS — Z8673 Personal history of transient ischemic attack (TIA), and cerebral infarction without residual deficits: Secondary | ICD-10-CM

## 2021-10-10 DIAGNOSIS — J449 Chronic obstructive pulmonary disease, unspecified: Secondary | ICD-10-CM | POA: Diagnosis not present

## 2021-10-10 DIAGNOSIS — F32A Depression, unspecified: Secondary | ICD-10-CM | POA: Diagnosis not present

## 2021-10-10 DIAGNOSIS — R296 Repeated falls: Secondary | ICD-10-CM | POA: Diagnosis not present

## 2021-10-10 DIAGNOSIS — I10 Essential (primary) hypertension: Secondary | ICD-10-CM | POA: Diagnosis not present

## 2021-10-10 DIAGNOSIS — M86072 Acute hematogenous osteomyelitis, left ankle and foot: Secondary | ICD-10-CM | POA: Diagnosis not present

## 2021-10-10 DIAGNOSIS — Z7982 Long term (current) use of aspirin: Secondary | ICD-10-CM | POA: Diagnosis not present

## 2021-10-10 DIAGNOSIS — E872 Acidosis, unspecified: Secondary | ICD-10-CM | POA: Diagnosis present

## 2021-10-10 DIAGNOSIS — L97526 Non-pressure chronic ulcer of other part of left foot with bone involvement without evidence of necrosis: Secondary | ICD-10-CM | POA: Diagnosis not present

## 2021-10-10 DIAGNOSIS — S065X0S Traumatic subdural hemorrhage without loss of consciousness, sequela: Secondary | ICD-10-CM | POA: Diagnosis not present

## 2021-10-10 DIAGNOSIS — I70245 Atherosclerosis of native arteries of left leg with ulceration of other part of foot: Secondary | ICD-10-CM | POA: Diagnosis not present

## 2021-10-10 DIAGNOSIS — R569 Unspecified convulsions: Secondary | ICD-10-CM

## 2021-10-10 DIAGNOSIS — Z79899 Other long term (current) drug therapy: Secondary | ICD-10-CM

## 2021-10-10 DIAGNOSIS — W19XXXA Unspecified fall, initial encounter: Secondary | ICD-10-CM | POA: Diagnosis not present

## 2021-10-10 DIAGNOSIS — Z20822 Contact with and (suspected) exposure to covid-19: Secondary | ICD-10-CM | POA: Diagnosis not present

## 2021-10-10 DIAGNOSIS — L089 Local infection of the skin and subcutaneous tissue, unspecified: Secondary | ICD-10-CM | POA: Diagnosis not present

## 2021-10-10 DIAGNOSIS — Z7902 Long term (current) use of antithrombotics/antiplatelets: Secondary | ICD-10-CM

## 2021-10-10 DIAGNOSIS — F17210 Nicotine dependence, cigarettes, uncomplicated: Secondary | ICD-10-CM | POA: Diagnosis present

## 2021-10-10 DIAGNOSIS — N400 Enlarged prostate without lower urinary tract symptoms: Secondary | ICD-10-CM | POA: Diagnosis present

## 2021-10-10 DIAGNOSIS — D649 Anemia, unspecified: Secondary | ICD-10-CM | POA: Diagnosis not present

## 2021-10-10 DIAGNOSIS — S91302A Unspecified open wound, left foot, initial encounter: Secondary | ICD-10-CM | POA: Diagnosis not present

## 2021-10-10 DIAGNOSIS — R531 Weakness: Secondary | ICD-10-CM | POA: Diagnosis not present

## 2021-10-10 DIAGNOSIS — T8781 Dehiscence of amputation stump: Secondary | ICD-10-CM | POA: Diagnosis not present

## 2021-10-10 DIAGNOSIS — R278 Other lack of coordination: Secondary | ICD-10-CM | POA: Diagnosis not present

## 2021-10-10 DIAGNOSIS — G9340 Encephalopathy, unspecified: Secondary | ICD-10-CM | POA: Diagnosis not present

## 2021-10-10 DIAGNOSIS — Y92239 Unspecified place in hospital as the place of occurrence of the external cause: Secondary | ICD-10-CM | POA: Diagnosis not present

## 2021-10-10 DIAGNOSIS — I70229 Atherosclerosis of native arteries of extremities with rest pain, unspecified extremity: Secondary | ICD-10-CM | POA: Diagnosis present

## 2021-10-10 DIAGNOSIS — M109 Gout, unspecified: Secondary | ICD-10-CM | POA: Diagnosis present

## 2021-10-10 DIAGNOSIS — T8789 Other complications of amputation stump: Secondary | ICD-10-CM | POA: Diagnosis not present

## 2021-10-10 DIAGNOSIS — M869 Osteomyelitis, unspecified: Secondary | ICD-10-CM | POA: Diagnosis present

## 2021-10-10 DIAGNOSIS — M86272 Subacute osteomyelitis, left ankle and foot: Secondary | ICD-10-CM

## 2021-10-10 DIAGNOSIS — I639 Cerebral infarction, unspecified: Secondary | ICD-10-CM | POA: Diagnosis not present

## 2021-10-10 DIAGNOSIS — R52 Pain, unspecified: Secondary | ICD-10-CM | POA: Diagnosis not present

## 2021-10-10 DIAGNOSIS — Z8249 Family history of ischemic heart disease and other diseases of the circulatory system: Secondary | ICD-10-CM

## 2021-10-10 DIAGNOSIS — Y835 Amputation of limb(s) as the cause of abnormal reaction of the patient, or of later complication, without mention of misadventure at the time of the procedure: Secondary | ICD-10-CM | POA: Diagnosis present

## 2021-10-10 DIAGNOSIS — I739 Peripheral vascular disease, unspecified: Secondary | ICD-10-CM | POA: Diagnosis not present

## 2021-10-10 DIAGNOSIS — K2100 Gastro-esophageal reflux disease with esophagitis, without bleeding: Secondary | ICD-10-CM | POA: Diagnosis not present

## 2021-10-10 DIAGNOSIS — Z743 Need for continuous supervision: Secondary | ICD-10-CM | POA: Diagnosis not present

## 2021-10-10 DIAGNOSIS — M79673 Pain in unspecified foot: Secondary | ICD-10-CM | POA: Diagnosis not present

## 2021-10-10 DIAGNOSIS — R262 Difficulty in walking, not elsewhere classified: Secondary | ICD-10-CM | POA: Diagnosis not present

## 2021-10-10 DIAGNOSIS — E440 Moderate protein-calorie malnutrition: Secondary | ICD-10-CM | POA: Diagnosis not present

## 2021-10-10 DIAGNOSIS — M86172 Other acute osteomyelitis, left ankle and foot: Secondary | ICD-10-CM | POA: Diagnosis present

## 2021-10-10 DIAGNOSIS — R58 Hemorrhage, not elsewhere classified: Secondary | ICD-10-CM | POA: Diagnosis not present

## 2021-10-10 DIAGNOSIS — E785 Hyperlipidemia, unspecified: Secondary | ICD-10-CM | POA: Diagnosis not present

## 2021-10-10 DIAGNOSIS — R1312 Dysphagia, oropharyngeal phase: Secondary | ICD-10-CM | POA: Diagnosis not present

## 2021-10-10 DIAGNOSIS — A419 Sepsis, unspecified organism: Secondary | ICD-10-CM

## 2021-10-10 DIAGNOSIS — J90 Pleural effusion, not elsewhere classified: Secondary | ICD-10-CM | POA: Diagnosis not present

## 2021-10-10 DIAGNOSIS — N179 Acute kidney failure, unspecified: Secondary | ICD-10-CM | POA: Diagnosis not present

## 2021-10-10 DIAGNOSIS — R2689 Other abnormalities of gait and mobility: Secondary | ICD-10-CM | POA: Diagnosis not present

## 2021-10-10 LAB — URINALYSIS, ROUTINE W REFLEX MICROSCOPIC
Bilirubin Urine: NEGATIVE
Glucose, UA: NEGATIVE mg/dL
Hgb urine dipstick: NEGATIVE
Ketones, ur: NEGATIVE mg/dL
Leukocytes,Ua: NEGATIVE
Nitrite: NEGATIVE
Protein, ur: NEGATIVE mg/dL
Specific Gravity, Urine: 1.018 (ref 1.005–1.030)
pH: 5 (ref 5.0–8.0)

## 2021-10-10 LAB — CBC WITH DIFFERENTIAL/PLATELET
Abs Immature Granulocytes: 0.04 10*3/uL (ref 0.00–0.07)
Basophils Absolute: 0 10*3/uL (ref 0.0–0.1)
Basophils Relative: 0 %
Eosinophils Absolute: 0.4 10*3/uL (ref 0.0–0.5)
Eosinophils Relative: 5 %
HCT: 37.1 % — ABNORMAL LOW (ref 39.0–52.0)
Hemoglobin: 11.4 g/dL — ABNORMAL LOW (ref 13.0–17.0)
Immature Granulocytes: 1 %
Lymphocytes Relative: 21 %
Lymphs Abs: 1.8 10*3/uL (ref 0.7–4.0)
MCH: 25.3 pg — ABNORMAL LOW (ref 26.0–34.0)
MCHC: 30.7 g/dL (ref 30.0–36.0)
MCV: 82.3 fL (ref 80.0–100.0)
Monocytes Absolute: 0.8 10*3/uL (ref 0.1–1.0)
Monocytes Relative: 10 %
Neutro Abs: 5.5 10*3/uL (ref 1.7–7.7)
Neutrophils Relative %: 63 %
Platelets: 581 10*3/uL — ABNORMAL HIGH (ref 150–400)
RBC: 4.51 MIL/uL (ref 4.22–5.81)
RDW: 16.3 % — ABNORMAL HIGH (ref 11.5–15.5)
WBC: 8.6 10*3/uL (ref 4.0–10.5)
nRBC: 0 % (ref 0.0–0.2)

## 2021-10-10 LAB — COMPREHENSIVE METABOLIC PANEL
ALT: 16 U/L (ref 0–44)
AST: 20 U/L (ref 15–41)
Albumin: 3.3 g/dL — ABNORMAL LOW (ref 3.5–5.0)
Alkaline Phosphatase: 108 U/L (ref 38–126)
Anion gap: 11 (ref 5–15)
BUN: 11 mg/dL (ref 8–23)
CO2: 23 mmol/L (ref 22–32)
Calcium: 9.6 mg/dL (ref 8.9–10.3)
Chloride: 103 mmol/L (ref 98–111)
Creatinine, Ser: 1.19 mg/dL (ref 0.61–1.24)
GFR, Estimated: >60 mL/min (ref >60)
Glucose, Bld: 87 mg/dL (ref 70–99)
Potassium: 4.9 mmol/L (ref 3.5–5.1)
Sodium: 137 mmol/L (ref 135–145)
Total Bilirubin: 0.1 mg/dL — ABNORMAL LOW (ref 0.3–1.2)
Total Protein: 7.6 g/dL (ref 6.5–8.1)

## 2021-10-10 LAB — RESP PANEL BY RT-PCR (FLU A&B, COVID) ARPGX2
Influenza A by PCR: NEGATIVE
Influenza B by PCR: NEGATIVE
SARS Coronavirus 2 by RT PCR: NEGATIVE

## 2021-10-10 LAB — APTT: aPTT: 37 seconds — ABNORMAL HIGH (ref 24–36)

## 2021-10-10 LAB — PROTIME-INR
INR: 1.1 (ref 0.8–1.2)
Prothrombin Time: 14.1 seconds (ref 11.4–15.2)

## 2021-10-10 LAB — LACTIC ACID, PLASMA
Lactic Acid, Venous: 2.3 mmol/L (ref 0.5–1.9)
Lactic Acid, Venous: 2.1 mmol/L (ref 0.5–1.9)

## 2021-10-10 IMAGING — CR DG FOOT COMPLETE 3+V*L*
3 series · 3 of 3 positions shown · non-contrast
Comparison: [DATE]

CLINICAL DATA: Purulent drainage LEFT foot wound

EXAM:
LEFT FOOT - COMPLETE 3+ VIEW

[foot ap]
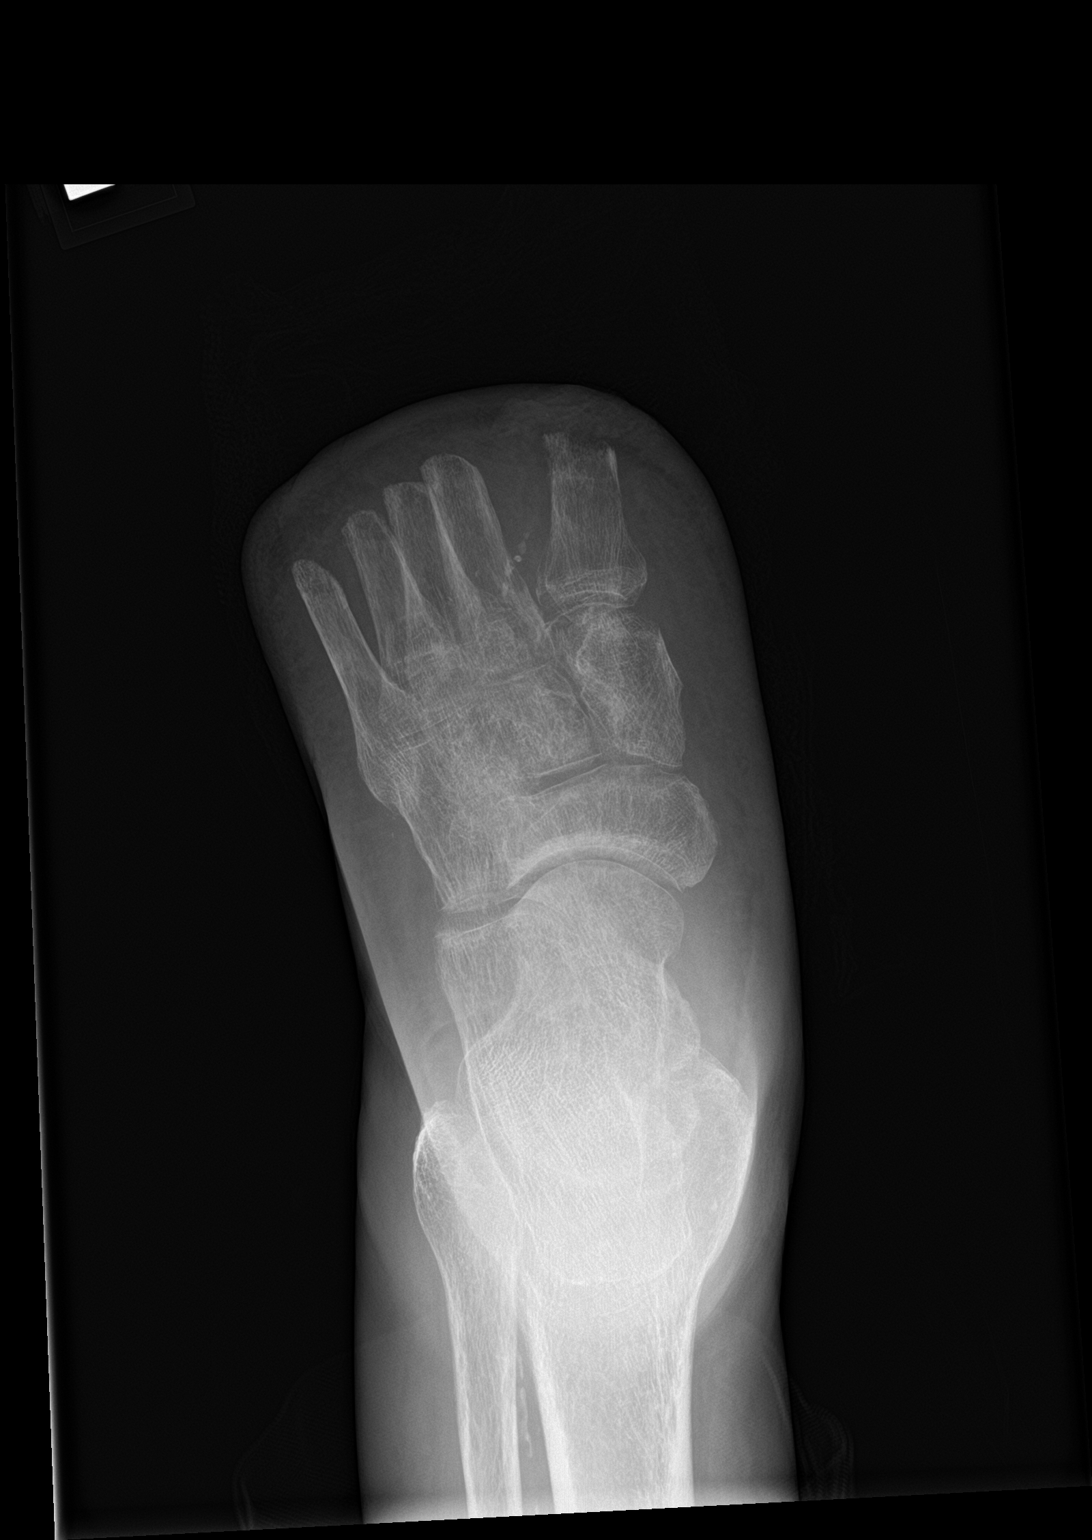

[foot obl]
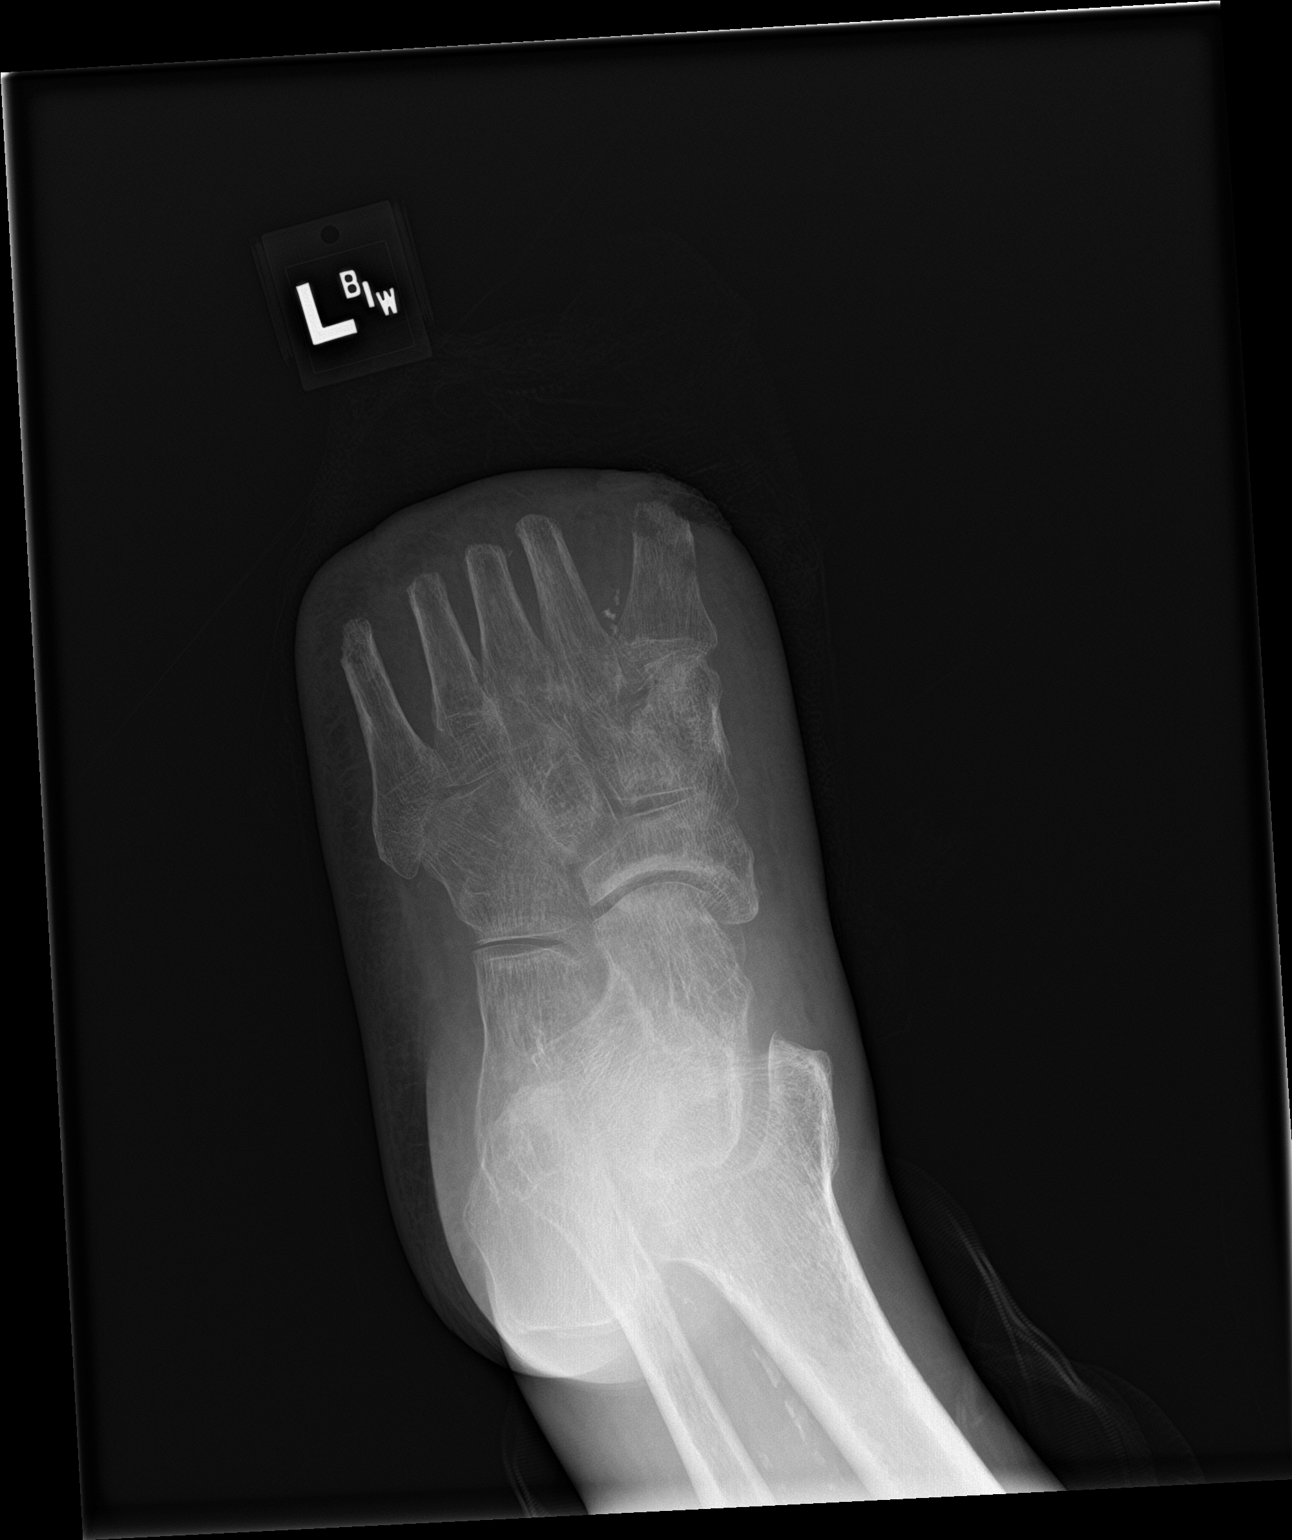

[foot lat]
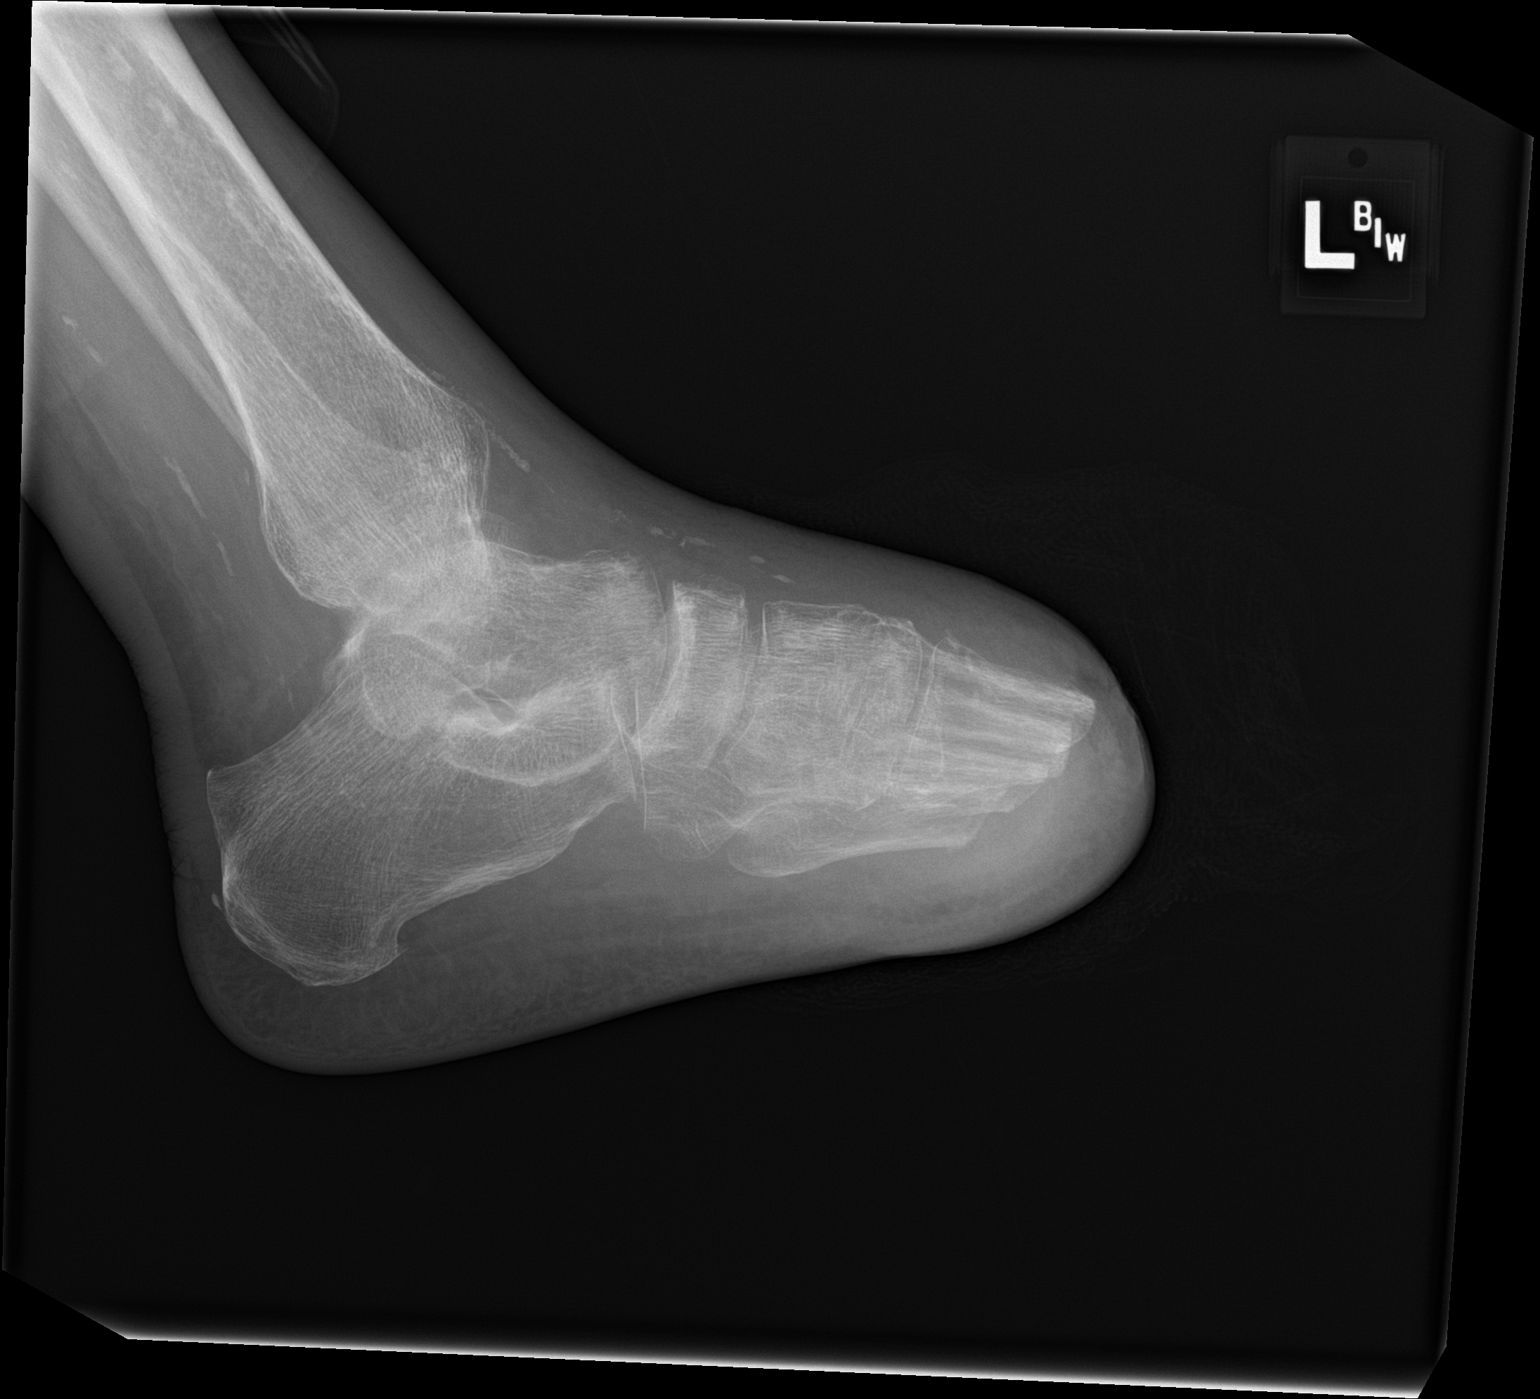

[3 of 3 positions shown; findings below may reference images not displayed]

FINDINGS: Osseous demineralization.

Interval transmetatarsal amputation.

Stumps of the second through fifth metatarsals are grossly
unremarkable.

Bone destruction identified at first metatarsal stump highly
concerning for osteomyelitis.

No acute fracture, dislocation, or additional bone destruction.

Regional soft tissue swelling.

Small vessel vascular calcifications at ankle into foot.
IMPRESSION: Interval transmetatarsal amputation with bone destruction at the
stump of the first metatarsal highly suspicious for osteomyelitis.

## 2021-10-10 IMAGING — CR DG CHEST 1V
1 series · 1 of 1 positions shown · non-contrast
Comparison: [DATE]

CLINICAL DATA: LEFT foot wound

EXAM:
CHEST  1 VIEW

[chest ap]
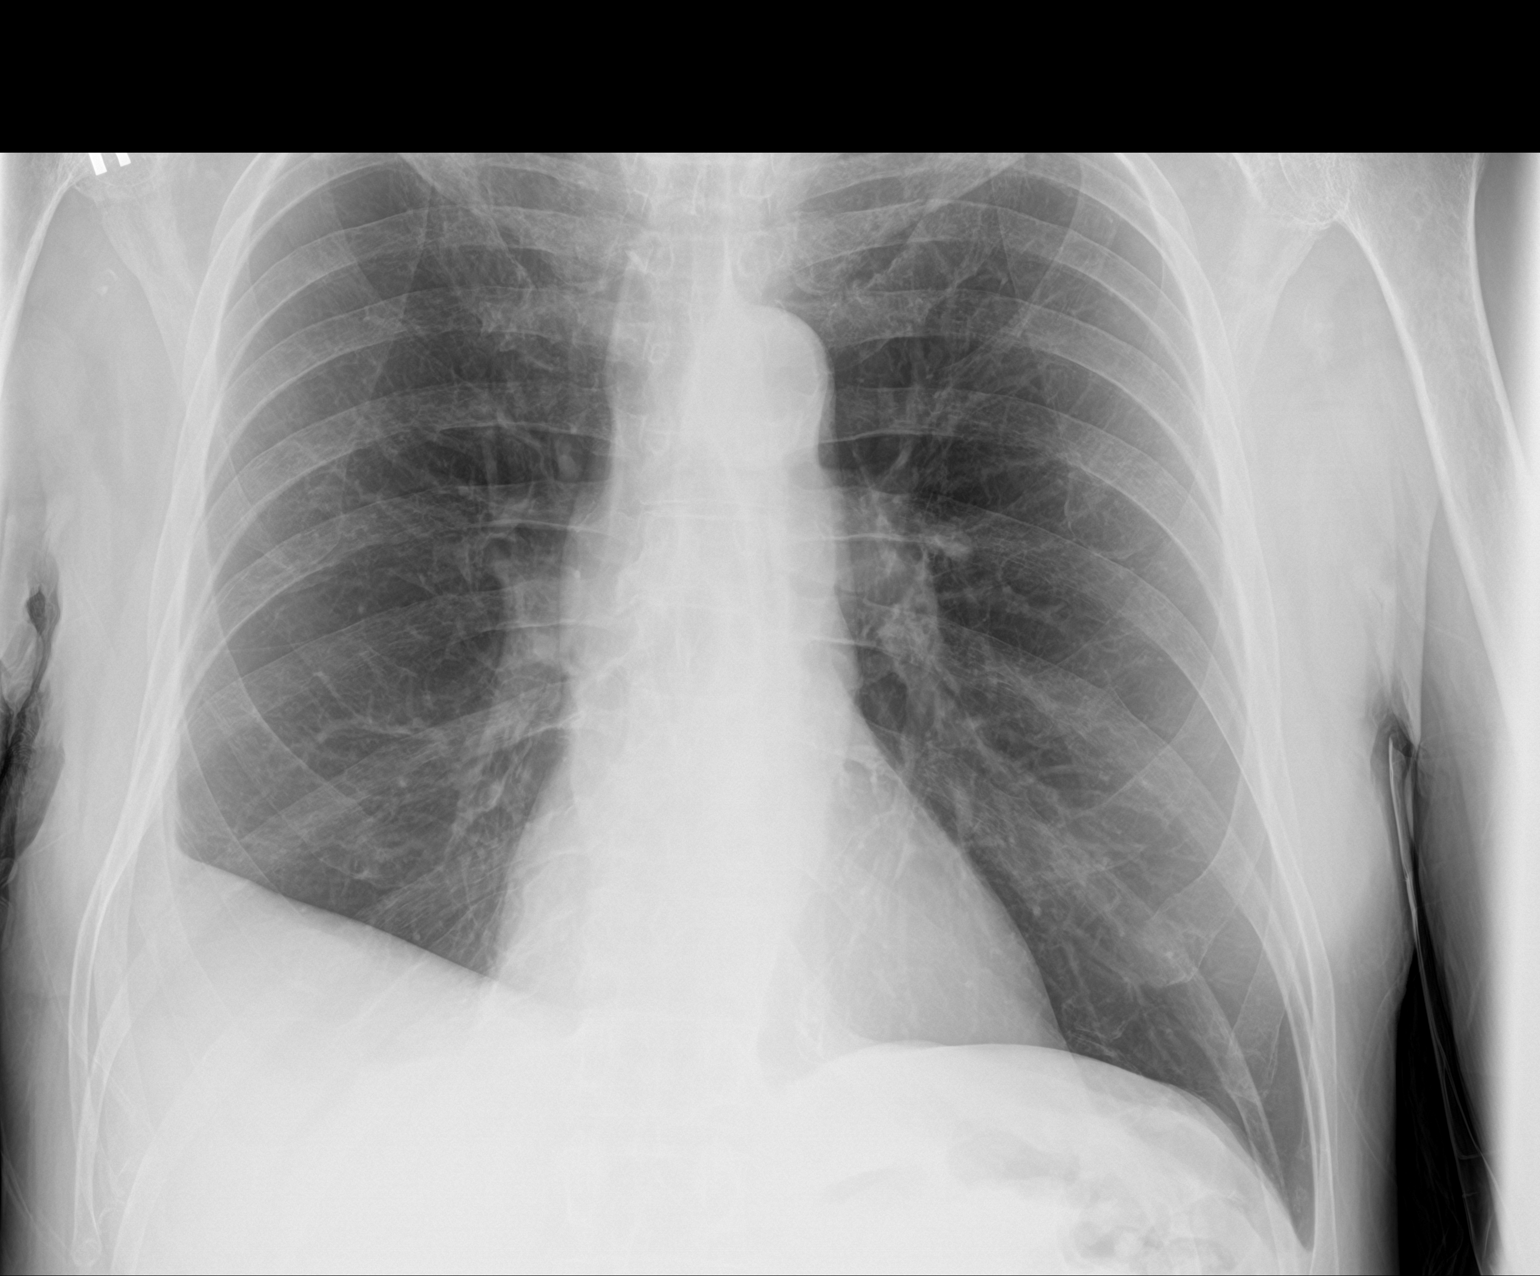

[1 of 1 positions shown; findings below may reference images not displayed]

FINDINGS: Normal heart size, mediastinal contours, and pulmonary vascularity.

Atherosclerotic calcification aorta.

Increased blunting of RIGHT costophrenic angle by effusion.

Remaining lungs clear.

No infiltrate or pneumothorax.

Bones demineralized.
IMPRESSION: Small RIGHT pleural effusion increased from previous exam.

Aortic Atherosclerosis ([3I]-[3I]).

## 2021-10-10 MED ORDER — PIPERACILLIN-TAZOBACTAM 3.375 G IVPB 30 MIN
3.3750 g | Freq: Once | INTRAVENOUS | Status: AC
  Administered 2021-10-10: 3.375 g via INTRAVENOUS
  Filled 2021-10-10: qty 50

## 2021-10-10 MED ORDER — SODIUM CHLORIDE 0.9 % IV SOLN
2.0000 g | INTRAVENOUS | Status: DC
  Administered 2021-10-10 – 2021-10-14 (×5): 2 g via INTRAVENOUS
  Filled 2021-10-10 (×6): qty 20

## 2021-10-10 MED ORDER — METRONIDAZOLE 500 MG/100ML IV SOLN
500.0000 mg | Freq: Two times a day (BID) | INTRAVENOUS | Status: DC
  Administered 2021-10-10 – 2021-10-15 (×8): 500 mg via INTRAVENOUS
  Filled 2021-10-10 (×8): qty 100

## 2021-10-10 MED ORDER — VANCOMYCIN HCL 1750 MG/350ML IV SOLN
1750.0000 mg | INTRAVENOUS | Status: DC
  Administered 2021-10-10 – 2021-10-12 (×3): 1750 mg via INTRAVENOUS
  Filled 2021-10-10 (×3): qty 350

## 2021-10-10 NOTE — ED Provider Triage Note (Signed)
Emergency Medicine Provider Triage Evaluation Note  KHADEEM ROCKETT , a 66 y.o. male  was evaluated in triage.  Pt complains of left foot draining purulent fluid.  Patient has had amputation of all digits on left foot.  States history of osteomyelitis.  States 2 days ago he noticed blood at the end of his sock and took off the sock to evaluate his foot and found significant drainage since then.  Endorses chills for the last 3 to 4 weeks.  Stay most recent amputation occurred 4 months ago.  Denies injury.   Review of Systems  Positive: As above Negative: As above  Physical Exam  BP 111/73    Pulse 81    Temp 98.3 F (36.8 C) (Oral)    Resp 12    SpO2 100%  Gen:   Awake, no distress   Resp:  Normal effort  MSK:   Moves extremities without difficulty  Other:  All digits of left foot amputated.  Visible drainage present the distal end of left foot.  Dopplerable DP pulse present bilaterally.  Medical Decision Making  Medically screening exam initiated at 3:37 PM.  Appropriate orders placed.  [name] was informed that the remainder of the evaluation will be completed by another provider, this initial triage assessment does not replace that evaluation, and the importance of remaining in the ED until their evaluation is complete.     Evlyn Courier, PA-C 10/10/21 1538
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
## 2021-10-10 NOTE — ED Provider Notes (Signed)
[facility] EMERGENCY DEPARTMENT Provider Note   CSN: [id number] Arrival date & time: 10/10/21  1504     History  Chief Complaint  Patient presents with   foot/wound infection    Seth [name] is a 65 y.o. male who presents emergency department complaining of a left foot wound.  He had amputation of all digits on the left foot 4 months ago, and states that 2 days ago he noticed blood at the end of his sock.  He took his sock off to evaluate the foot, and has seen drainage of purulent fluid since then.  He reports history of osteomyelitis. He also endorses chills for the last 3 to 4 weeks.  Most recent amputation in January 2022.  He denies any injury to the foot.  HPI     Home Medications Prior to Admission medications   Medication Sig Start Date End Date Taking? Authorizing Provider  albuterol (PROVENTIL HFA;VENTOLIN HFA) 108 (90 Base) MCG/ACT inhaler Inhale 2 puffs into the lungs every 6 (six) hours as needed for wheezing or shortness of breath. 12/07/17   Mack Hook, MD  aspirin 81 MG chewable tablet Chew 1 tablet (81 mg total) by mouth daily. 07/30/20   Ghimire, Henreitta Leber, MD  clopidogrel (PLAVIX) 75 MG tablet Take 1 tablet (75 mg total) by mouth daily. 05/24/21   Thurnell Lose, MD  feeding supplement (ENSURE ENLIVE / ENSURE PLUS) LIQD Take 237 mLs by mouth 3 (three) times daily between meals. 07/30/20   Ghimire, Henreitta Leber, MD  ferrous sulfate 325 (65 FE) MG tablet Take 1 tablet (325 mg total) by mouth daily with breakfast. 01/23/21   [place] Donnamarie Poag, DO  fluticasone-salmeterol (ADVAIR) 500-50 MCG/ACT AEPB Inhale 1 puff into the lungs 2 (two) times daily. 04/13/21   [provider]  folic acid (FOLVITE) 1 MG tablet Take 1 tablet (1 mg total) by mouth daily. 03/21/21   Kathie Dike, MD  gabapentin (NEURONTIN) 100 MG capsule Take 100 mg by mouth 3 (three) times daily. 07/07/21   [provider]  INCRUSE ELLIPTA 62.5 MCG/INH AEPB INHALE  1 PUFF INTO THE LUNGS DAILY 04/08/19   Mack Hook, MD  Lacosamide 100 MG TABS Take 1 tablet (100 mg total) by mouth 2 (two) times daily. 05/24/21   Thurnell Lose, MD  levETIRAcetam (KEPPRA) 750 MG tablet Take 2 tablets (1,500 mg total) by mouth 2 (two) times daily. 03/20/21   Kathie Dike, MD  metoprolol tartrate (LOPRESSOR) 50 MG tablet Take 1 tablet (50 mg total) by mouth 2 (two) times daily. 03/20/21   Kathie Dike, MD  Multiple Vitamin (MULTIVITAMIN WITH MINERALS) TABS tablet Take 1 tablet by mouth daily. 06/02/19   Debbe Odea, MD  pantoprazole (PROTONIX) 40 MG tablet Take 1 tablet (40 mg total) by mouth daily. 07/30/20   Ghimire, Henreitta Leber, MD  PHENobarbital (LUMINAL) 32.4 MG tablet Take 1 tablet (32.4 mg total) by mouth 2 (two) times daily. 09/30/21 10/30/21  Alric Ran, MD  PHENobarbital (LUMINAL) 32.4 MG tablet Take 1 tablet (32.4 mg total) by mouth every morning AND 2 tablets (64.8 mg total) every evening. 09/01/21 10/01/21  Alric Ran, MD  polyethylene glycol (MIRALAX / GLYCOLAX) 17 g packet Take 17 g by mouth daily as needed for moderate constipation. 03/20/21   Kathie Dike, MD  rosuvastatin (CRESTOR) 10 MG tablet Take 1 tablet (10 mg total) by mouth daily. 09/28/20   Charlynne Cousins, MD  sertraline (ZOLOFT) 25 MG tablet Take 25  mg by mouth daily.    [provider]  thiamine 100 MG tablet Take 1 tablet (100 mg total) by mouth daily. 03/21/21   Kathie Dike, MD  Tiotropium Bromide Monohydrate (SPIRIVA RESPIMAT) 2.5 MCG/ACT AERS Inhale into the lungs.    [provider]  traMADol (ULTRAM) 50 MG tablet Take 25 mg by mouth every 6 (six) hours as needed. 07/14/21   [provider]  vitamin B-12 1000 MCG tablet Take 1 tablet (1,000 mcg total) by mouth daily. 01/22/21   [place] Eric J, DO      Allergies    Patient has no known allergies.    Review of Systems   Review of Systems  Physical Exam Updated Vital Signs BP 124/69    Pulse 73     Temp 98 F (36.7 C) (Oral)    Resp 15    SpO2 100%  Physical Exam Vitals and nursing note reviewed.  Constitutional:      Appearance: Normal appearance.  HENT:     Head: Normocephalic and atraumatic.  Eyes:     Conjunctiva/sclera: Conjunctivae normal.  Cardiovascular:     Pulses:          Dorsalis pedis pulses are detected w/ Doppler on the right side and detected w/ Doppler on the left side.  Pulmonary:     Effort: Pulmonary effort is normal. No respiratory distress.  Musculoskeletal:     Right lower leg: No edema.     Left lower leg: No edema.  Skin:    General: Skin is warm and dry.     Comments: All digits of left foot amputated.   Neurological:     Mental Status: He is alert.  Psychiatric:        Mood and Affect: Mood normal.        Behavior: Behavior normal.       ED Results / Procedures / Treatments   Labs (all labs ordered are listed, but only abnormal results are displayed) Labs Reviewed  LACTIC ACID, PLASMA - Abnormal; Notable for the following components:      Result Value   Lactic Acid, Venous 2.3 (*)    All other components within normal limits  COMPREHENSIVE METABOLIC PANEL - Abnormal; Notable for the following components:   Albumin 3.3 (*)    Total Bilirubin 0.1 (*)    All other components within normal limits  CBC WITH DIFFERENTIAL/PLATELET - Abnormal; Notable for the following components:   Hemoglobin 11.4 (*)    HCT 37.1 (*)    MCH 25.3 (*)    RDW 16.3 (*)    Platelets 581 (*)    All other components within normal limits  APTT - Abnormal; Notable for the following components:   aPTT 37 (*)    All other components within normal limits  CULTURE, BLOOD (ROUTINE X 2)  CULTURE, BLOOD (ROUTINE X 2)  URINE CULTURE  RESP PANEL BY RT-PCR (FLU A&B, COVID) ARPGX2  PROTIME-INR  LACTIC ACID, PLASMA  URINALYSIS, ROUTINE W REFLEX MICROSCOPIC    EKG None  Radiology DG Chest 1 View  Result Date: 10/10/2021 CLINICAL DATA:  LEFT foot wound EXAM:  CHEST  1 VIEW COMPARISON:  05/16/2021 FINDINGS: Normal heart size, mediastinal contours, and pulmonary vascularity. Atherosclerotic calcification aorta. Increased blunting of RIGHT costophrenic angle by effusion. Remaining lungs clear. No infiltrate or pneumothorax. Bones demineralized. IMPRESSION: Small RIGHT pleural effusion increased from previous exam. Aortic Atherosclerosis (ICD10-I70.0). Electronically Signed   By: Crist Infante.D.  On: 10/10/2021 16:31   DG Foot Complete Left  Result Date: 10/10/2021 CLINICAL DATA:  Purulent drainage LEFT foot wound EXAM: LEFT FOOT - COMPLETE 3+ VIEW COMPARISON:  09/14/2020 FINDINGS: Osseous demineralization. Interval transmetatarsal amputation. Stumps of the second through fifth metatarsals are grossly unremarkable. Bone destruction identified at first metatarsal stump highly concerning for osteomyelitis. No acute fracture, dislocation, or additional bone destruction. Regional soft tissue swelling. Small vessel vascular calcifications at ankle into foot. IMPRESSION: Interval transmetatarsal amputation with bone destruction at the stump of the first metatarsal highly suspicious for osteomyelitis. Electronically Signed   By: Lavonia Dana M.D.   On: 10/10/2021 16:30    Procedures .Critical Care Performed by: Kateri Plummer, PA-C Authorized by: Kateri Plummer, PA-C   Critical care provider statement:    Critical care time (minutes):  30   Critical care was necessary to treat or prevent imminent or life-threatening deterioration of the following conditions:  Sepsis   Critical care was time spent personally by me on the following activities:  Development of treatment plan with patient or surrogate, discussions with consultants, evaluation of patient's response to treatment, examination of patient, ordering and review of laboratory studies, ordering and review of radiographic studies, ordering and performing treatments and interventions, pulse oximetry,  re-evaluation of patient's condition and review of old charts    Medications Ordered in ED Medications  vancomycin (VANCOREADY) IVPB 1750 mg/350 mL (1,750 mg Intravenous New Bag/Given 10/10/21 1844)  piperacillin-tazobactam (ZOSYN) IVPB 3.375 g (0 g Intravenous Stopped 10/10/21 1843)    ED Course/ Medical Decision Making/ A&P                           Medical Decision Making Risk Prescription drug management.   This patient presents to the ED for concern of foot infection, this involves an extensive number of treatment options, and is a complaint that carries with it a high risk of complications and morbidity. The emergent differential diagnosis prior to evaluation includes, but is not limited to, sepsis, cellulitis, osteomyelitis.  Past Medical History / Co-morbidities: Stroke, peripheral arterial disease s/p left metatarsal amputation January 2022, hypertension  Additional history: Additional history obtained from chart review. External records from outside source obtained and reviewed including patient's past surgical history including amputation operative note.  Physical Exam: Physical exam performed. The pertinent findings include: Purulent drainage present at the distal end of the left foot, at the stump of the great toe.  Lab Tests: I ordered, and personally interpreted labs.  The pertinent results include:   No leukocytosis, hemoglobin stable compared to prior, electrolytes grossly within normal limits, elevated lactic acid of 2.3.    Imaging Studies: I ordered imaging studies including x-ray chest and left foot. I independently visualized and interpreted imaging which showed no acute cardiopulmonary abnormalities. Foot x-ray showed bone destruction at the stump of the first metatarsal highly suspicious for osteomyelitis. I agree with the radiologist interpretation.   Medications: I ordered medication including empiric antibiotics  for cellulitis/osteomyelitis. I have  reviewed the patients home medicines and have made adjustments as needed.  Consultations Obtained: I requested consultation with the hospitalist Dr. Flossie Buffy,  and discussed lab and imaging findings as well as pertinent plan - they recommend: medical admission for IV antibiotics.   Disposition: After consideration of the diagnostic results and the patients response to treatment, I feel that patient will require admission for IV antibiotics.    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 Every effort was made to ensure accuracy; however, inadvertent computerized transcription errors may be present.    Estill Cotta 10/10/21 1849    Blanchie Dessert, MD 10/13/21 2126

## 2021-10-10 NOTE — ED Triage Notes (Signed)
Patient with left foot wound that has purulent drainage and pain to area around surgical site of big toe. Patient reports gangrene 2-3 months ago and all toes removed, denies any new trauma

## 2021-10-10 NOTE — Progress Notes (Signed)
Pharmacy Antibiotic Note  Seth [name] is a 66 y.o. male admitted on 10/10/2021 with cellulitis.  Pharmacy has been consulted for vancomycin dosing. WBC wnl. LA 2.3, SCr wnl.   Plan: -Vancomycin 1750 mg IV Q 24 hrs. Goal AUC 400-550. Expected AUC: 529 SCr used: 1.19 -F/u maintenance broad spectrum coverage -Monitor CBC, renal fx, cultures and clinical progress -Vanc levels as indicated       Temp (24hrs), Avg:98.2 F (36.8 C), Min:98 F (36.7 C), Max:98.3 F (36.8 C)  Recent Labs  Lab 10/10/21 1545  WBC 8.6  CREATININE 1.19  LATICACIDVEN 2.3*    Estimated Creatinine Clearance: 68 mL/min (by C-G formula based on SCr of 1.19 mg/dL).    No Known Allergies  Antimicrobials this admission: Zosyn 2/13 >>  Vancomycin 2/13 >>   Dose adjustments this admission:   Microbiology results: 2/13 BCx:  2/13 UCx:     Thank you for allowing pharmacy to be a part of this patients care.  Albertina Parr, PharmD., BCCCP Clinical Pharmacist Please refer to [facility] for unit-specific pharmacist

## 2021-10-11 ENCOUNTER — Inpatient Hospital Stay (HOSPITAL_COMMUNITY): Payer: Medicare Other

## 2021-10-11 DIAGNOSIS — I70245 Atherosclerosis of native arteries of left leg with ulceration of other part of foot: Secondary | ICD-10-CM

## 2021-10-11 DIAGNOSIS — T8789 Other complications of amputation stump: Secondary | ICD-10-CM

## 2021-10-11 DIAGNOSIS — I739 Peripheral vascular disease, unspecified: Secondary | ICD-10-CM | POA: Diagnosis not present

## 2021-10-11 DIAGNOSIS — M86172 Other acute osteomyelitis, left ankle and foot: Secondary | ICD-10-CM | POA: Diagnosis not present

## 2021-10-11 DIAGNOSIS — I10 Essential (primary) hypertension: Secondary | ICD-10-CM

## 2021-10-11 LAB — CBC
HCT: 30.4 % — ABNORMAL LOW (ref 39.0–52.0)
Hemoglobin: 9.4 g/dL — ABNORMAL LOW (ref 13.0–17.0)
MCH: 24.9 pg — ABNORMAL LOW (ref 26.0–34.0)
MCHC: 30.9 g/dL (ref 30.0–36.0)
MCV: 80.4 fL (ref 80.0–100.0)
Platelets: 513 10*3/uL — ABNORMAL HIGH (ref 150–400)
RBC: 3.78 MIL/uL — ABNORMAL LOW (ref 4.22–5.81)
RDW: 16 % — ABNORMAL HIGH (ref 11.5–15.5)
WBC: 7.3 10*3/uL (ref 4.0–10.5)
nRBC: 0 % (ref 0.0–0.2)

## 2021-10-11 MED ORDER — LACOSAMIDE 50 MG PO TABS
100.0000 mg | ORAL_TABLET | Freq: Two times a day (BID) | ORAL | Status: DC
  Administered 2021-10-11 – 2021-10-18 (×16): 100 mg via ORAL
  Filled 2021-10-11 (×17): qty 2

## 2021-10-11 MED ORDER — LEVETIRACETAM 500 MG PO TABS
1500.0000 mg | ORAL_TABLET | Freq: Two times a day (BID) | ORAL | Status: DC
  Administered 2021-10-11 – 2021-10-15 (×10): 1500 mg via ORAL
  Filled 2021-10-11 (×11): qty 3

## 2021-10-11 MED ORDER — ASPIRIN EC 81 MG PO TBEC
81.0000 mg | DELAYED_RELEASE_TABLET | Freq: Every day | ORAL | Status: DC
  Administered 2021-10-11 – 2021-10-18 (×8): 81 mg via ORAL
  Filled 2021-10-11 (×8): qty 1

## 2021-10-11 MED ORDER — PHENOBARBITAL 32.4 MG PO TABS
64.8000 mg | ORAL_TABLET | Freq: Every day | ORAL | Status: DC
  Administered 2021-10-11 – 2021-10-17 (×7): 64.8 mg via ORAL
  Filled 2021-10-11 (×7): qty 2

## 2021-10-11 MED ORDER — PHENOBARBITAL 32.4 MG PO TABS
32.4000 mg | ORAL_TABLET | Freq: Every day | ORAL | Status: DC
  Administered 2021-10-11 – 2021-10-18 (×8): 32.4 mg via ORAL
  Filled 2021-10-11 (×8): qty 1

## 2021-10-11 MED ORDER — PHENOBARBITAL 32.4 MG PO TABS
64.8000 mg | ORAL_TABLET | Freq: Once | ORAL | Status: AC
  Administered 2021-10-11: 64.8 mg via ORAL
  Filled 2021-10-11: qty 2
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
## 2021-10-11 NOTE — Progress Notes (Signed)
Patient came to the floor around 2240, VSS, all  scheduled antibiotic given, skin intact except  left foot wound, VSS, denied pain, patient has no concern at this time, will continue to  monitor  denied

## 2021-10-11 NOTE — Plan of Care (Signed)

## 2021-10-11 NOTE — Progress Notes (Signed)
ABI w/ TBI study completed.   Please see CV Proc for preliminary results.   Yovany Clock, RDMS, RVT  

## 2021-10-11 NOTE — Progress Notes (Signed)
PROGRESS NOTE  Seth [name]  DOB: February 20, 1956  PCP: Lucianne Lei, MD GUR:[id number]  DOA: 10/10/2021  LOS: 1 day  Hospital Day: 2  Brief narrative: Seth [name] is a 66 y.o. male with PMH significant for stroke with residual right-sided weakness, subdural hematoma, seizure, hypertension, PAD with history of left transmetatarsal amputation BPH, gout, depression. Patient presented to the ED on 2/13 with complaint of worsening left foot wound. On 09/22/2020, patient had left transmetatarsal amputation for amputation done by Dr. Sharol Given.  Prior to that on 09/20/2020, patient had arteriogram and angioplasty status post laser atherectomy with balloon angioplasty and stent placement on his left popliteal artery done by Dr. Donzetta Matters.   For the last few days, patient noticed purulent drainage from left foot wound.  No new trauma.  In the ED, he was afebrile.  His wound showed exposed bone. Labs with normal WBC count Left foot x-ray showed interval transmetatarsal amputation with bone destruction at the stump of the first metatarsal highly suspicious for osteomyelitis.   He was started on IV vancomycin and Zosyn  Admitted to hospitalist service for further evaluation management  Subjective: Patient was seen and examined this morning.  Elderly African-American male thin built.  Not in distress Chart reviewed Hemodynamically stable Labs this morning with hemoglobin down to 9.4.  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 Sharol Given for osteomyelitis -Orthopedics consulted. -Currently on IV vancomycin, IV Rocephin and IV Flagyl.  Peripheral artery disease   -Sees vascular surgery as an outpatient. -09/20/2020, underwent arteriogram and angioplasty s/p laser arthrectomy with balloon angioplasty and stent placement to his left popliteal artery by Dr. Donzetta Matters -Last ABI from 08/25/2021 showed bilateral lower extremities PAD, moderate on the right and severe on the left.   -May need vascular intervention again prior to orthopedic intervention.  Vascular surgery consulted.  Noted a plan to do repeat angiography  Stroke with residual right-sided weakness, History of subdural hematoma -PTA, patient was on aspirin 81 mg daily, Plavix 75 mg daily, Crestor 10 mg daily    Hx of Seizure -He was previously admitted in 9/22 with focal status epilepticus.  Last seen by neurology on 09/01/2021. -continue Keppra 1000mg , Vimpat 100mg  BID, phenobarb 32.4mg  in the morning and 68.4mg  in the evening    Hypertension Controlled on metoprolol 50 mg twice daily  Anemia of chronic disease -Baseline hemoglobin over 10.  Currently at baseline.  Continue to monitor -Continue iron, vitamin B12  Mobility: Needs PT eval postprocedure Goals of care   Code Status: Full Code    Nutritional status:  There is no height or weight on file to calculate BMI.      Diet:  Diet Order             Diet NPO time specified  Diet effective midnight           Diet Heart Room service appropriate? Yes; Fluid consistency: Thin  Diet effective now                   DVT prophylaxis:  SCDs Start: 10/10/21 2021   Antimicrobials: IV ceftriaxone, Flagyl, vancomycin Fluid: None Consultants: Orthopedics, vascular surgery  Family Communication: None at bedside  Status is: Inpatient  Continue in-hospital care because: Pending angiography and amputation Level of care: Med-Surg   Dispo: The patient is from: Home              Anticipated d/c is to: Pending PT eval postprocedure              Patient currently is not medically stable to d/c.   Difficult to place patient  No     Infusions:   cefTRIAXone (ROCEPHIN)  IV 2 g (10/10/21 2334)   metronidazole 500 mg (10/11/21 1220)   vancomycin Stopped (10/10/21 2113)    Scheduled Meds:  aspirin EC  81 mg Oral Daily   lacosamide  100 mg Oral BID   levETIRAcetam  1,500 mg Oral BID   phenobarbital  32.4 mg Oral Daily   PHENobarbital  64.8 mg Oral QHS    PRN meds:    Antimicrobials: Anti-infectives (From admission, onward)    Start     Dose/Rate Route Frequency Ordered Stop   10/11/21 0000  cefTRIAXone (ROCEPHIN) 2 g in sodium chloride 0.9 % 100 mL IVPB        2 g 200 mL/hr over 30 Minutes Intravenous Every 24 hours 10/10/21 1958     10/11/21 0000  metroNIDAZOLE (FLAGYL) IVPB 500 mg        500 mg 100 mL/hr over 60 Minutes Intravenous Every 12 hours 10/10/21 1958     10/10/21 1830  vancomycin (VANCOREADY) IVPB 1750 mg/350 mL        1,750 mg 175 mL/hr over 120 Minutes Intravenous Every 24 hours 10/10/21 1755     10/10/21 1745  piperacillin-tazobactam (ZOSYN) IVPB 3.375 g        3.375 g 100 mL/hr over 30 Minutes Intravenous  Once 10/10/21 1739 10/10/21 1843       Objective: Vitals:   10/10/21 2244 10/11/21 0908  BP: 135/72 120/82  Pulse: 71 90  Resp: 20 17  Temp: 98.4 F (36.9 C) 98 F (36.7 C)  SpO2: 100% 100%    Intake/Output Summary (Last 24 hours) at 10/11/2021 1508 Last data filed at 10/11/2021 0500 Gross per 24 hour  Intake 614.02 ml  Output --  Net 614.02 ml   There were no vitals filed for this visit. Weight change:  There is no height or weight on file to calculate BMI.   Physical Exam: General exam: Pleasant, elderly African-American male Skin: No rashes, lesions or ulcers. HEENT: Atraumatic, normocephalic, no obvious bleeding Lungs: Clear to auscultation bilaterally CVS: Regular rate and rhythm, no murmur GI/Abd soft, nontender, nondistended, bowel sound present CNS: Alert, awake, oriented to place only.  Seems to have memory issues Psychiatry: Mood  appropriate Extremities: No pedal edema, no calf tenderness, left leg with previous transmetatarsal amputation with infected wound  Data Review: I have personally reviewed the laboratory data and studies available.  F/u labs  Unresulted Labs (From admission, onward)     Start     Ordered   10/12/21 0500  CBC with Differential/Platelet  Daily,   R      10/11/21 1508   10/12/21 9470  Basic metabolic panel  Daily,   R      10/11/21 1508   10/10/21 1536  Urine Culture  (Undifferentiated presentation (screening labs and basic nursing orders))  ONCE - STAT,   STAT       Question:  Indication  Answer:  Sepsis   10/10/21 1536  Signed, Terrilee Croak, MD [facility] 10/11/2021

## 2021-10-11 NOTE — Plan of Care (Signed)

## 2021-10-12 ENCOUNTER — Encounter (HOSPITAL_COMMUNITY)
Admission: EM | Disposition: A | Discharge status: Skilled Nursing Facility | Payer: Self-pay | Source: Home / Self Care | Attending: Student

## 2021-10-12 ENCOUNTER — Ambulatory Visit (HOSPITAL_COMMUNITY): Admission: RE | Admit: 2021-10-12 | Payer: Medicare Other | Source: Ambulatory Visit | Admitting: Vascular Surgery

## 2021-10-12 ENCOUNTER — Encounter (HOSPITAL_COMMUNITY): Payer: Self-pay | Admitting: Family Medicine

## 2021-10-12 DIAGNOSIS — I70245 Atherosclerosis of native arteries of left leg with ulceration of other part of foot: Secondary | ICD-10-CM

## 2021-10-12 DIAGNOSIS — Z89432 Acquired absence of left foot: Secondary | ICD-10-CM | POA: Diagnosis not present

## 2021-10-12 DIAGNOSIS — I739 Peripheral vascular disease, unspecified: Secondary | ICD-10-CM

## 2021-10-12 DIAGNOSIS — L089 Local infection of the skin and subcutaneous tissue, unspecified: Secondary | ICD-10-CM | POA: Diagnosis not present

## 2021-10-12 DIAGNOSIS — T8789 Other complications of amputation stump: Secondary | ICD-10-CM

## 2021-10-12 DIAGNOSIS — M86272 Subacute osteomyelitis, left ankle and foot: Secondary | ICD-10-CM

## 2021-10-12 HISTORY — PX: PERIPHERAL VASCULAR ATHERECTOMY: CATH118256

## 2021-10-12 HISTORY — PX: ABDOMINAL AORTOGRAM W/LOWER EXTREMITY: CATH118223

## 2021-10-12 HISTORY — PX: PERIPHERAL VASCULAR INTERVENTION: CATH118257

## 2021-10-12 LAB — BASIC METABOLIC PANEL
Anion gap: 10 (ref 5–15)
BUN: 10 mg/dL (ref 8–23)
CO2: 20 mmol/L — ABNORMAL LOW (ref 22–32)
Calcium: 9 mg/dL (ref 8.9–10.3)
Chloride: 105 mmol/L (ref 98–111)
Creatinine, Ser: 1.04 mg/dL (ref 0.61–1.24)
GFR, Estimated: >60 mL/min (ref >60)
Glucose, Bld: 84 mg/dL (ref 70–99)
Potassium: 4.3 mmol/L (ref 3.5–5.1)
Sodium: 135 mmol/L (ref 135–145)

## 2021-10-12 LAB — CBC WITH DIFFERENTIAL/PLATELET
Abs Immature Granulocytes: 0.02 10*3/uL (ref 0.00–0.07)
Basophils Absolute: 0 10*3/uL (ref 0.0–0.1)
Basophils Relative: 0 %
Eosinophils Absolute: 0.3 10*3/uL (ref 0.0–0.5)
Eosinophils Relative: 5 %
HCT: 31.5 % — ABNORMAL LOW (ref 39.0–52.0)
Hemoglobin: 10 g/dL — ABNORMAL LOW (ref 13.0–17.0)
Immature Granulocytes: 0 %
Lymphocytes Relative: 23 %
Lymphs Abs: 1.6 10*3/uL (ref 0.7–4.0)
MCH: 24.9 pg — ABNORMAL LOW (ref 26.0–34.0)
MCHC: 31.7 g/dL (ref 30.0–36.0)
MCV: 78.4 fL — ABNORMAL LOW (ref 80.0–100.0)
Monocytes Absolute: 0.8 10*3/uL (ref 0.1–1.0)
Monocytes Relative: 11 %
Neutro Abs: 4.2 10*3/uL (ref 1.7–7.7)
Neutrophils Relative %: 61 %
Platelets: 520 10*3/uL — ABNORMAL HIGH (ref 150–400)
RBC: 4.02 MIL/uL — ABNORMAL LOW (ref 4.22–5.81)
RDW: 16 % — ABNORMAL HIGH (ref 11.5–15.5)
WBC: 6.9 10*3/uL (ref 4.0–10.5)
nRBC: 0 % (ref 0.0–0.2)

## 2021-10-12 LAB — URINE CULTURE: Culture: 10000 — AB

## 2021-10-12 LAB — POCT ACTIVATED CLOTTING TIME: Activated Clotting Time: 275 seconds

## 2021-10-12 SURGERY — ABDOMINAL AORTOGRAM W/LOWER EXTREMITY
Anesthesia: LOCAL | Laterality: Left

## 2021-10-12 MED ORDER — LABETALOL HCL 5 MG/ML IV SOLN: 10.0000 mg | INTRAVENOUS | Status: DC | PRN

## 2021-10-12 MED ORDER — SODIUM CHLORIDE 0.9% FLUSH
3.0000 mL | INTRAVENOUS | Status: DC | PRN
Start: 2021-10-13 — End: 2021-10-18

## 2021-10-12 MED ORDER — MIDAZOLAM HCL 2 MG/2ML IJ SOLN
INTRAMUSCULAR | Status: DC | PRN
  Administered 2021-10-12: 1 mg via INTRAVENOUS

## 2021-10-12 MED ORDER — LIDOCAINE HCL (PF) 1 % IJ SOLN
INTRAMUSCULAR | Status: AC
  Filled 2021-10-12: qty 30

## 2021-10-12 MED ORDER — SODIUM CHLORIDE 0.9% FLUSH
3.0000 mL | Freq: Two times a day (BID) | INTRAVENOUS | Status: DC
  Administered 2021-10-13 – 2021-10-18 (×10): 3 mL via INTRAVENOUS

## 2021-10-12 MED ORDER — HEPARIN (PORCINE) IN NACL 1000-0.9 UT/500ML-% IV SOLN
INTRAVENOUS | Status: AC
  Filled 2021-10-12: qty 1000

## 2021-10-12 MED ORDER — FENTANYL CITRATE (PF) 100 MCG/2ML IJ SOLN
INTRAMUSCULAR | Status: AC
  Filled 2021-10-12: qty 2

## 2021-10-12 MED ORDER — HEPARIN SODIUM (PORCINE) 1000 UNIT/ML IJ SOLN
INTRAMUSCULAR | Status: AC
  Filled 2021-10-12: qty 10

## 2021-10-12 MED ORDER — LIDOCAINE-EPINEPHRINE 1 %-1:100000 IJ SOLN
10.0000 mL | Freq: Once | INTRAMUSCULAR | Status: AC
  Administered 2021-10-12: 7 mL via INTRADERMAL

## 2021-10-12 MED ORDER — LIDOCAINE-EPINEPHRINE 1 %-1:100000 IJ SOLN
INTRAMUSCULAR | Status: AC
  Filled 2021-10-12: qty 1

## 2021-10-12 MED ORDER — ACETAMINOPHEN 325 MG PO TABS: 650.0000 mg | ORAL_TABLET | ORAL | Status: DC | PRN

## 2021-10-12 MED ORDER — SODIUM CHLORIDE 0.9 % IV SOLN
250.0000 mL | INTRAVENOUS | Status: DC | PRN
Start: 2021-10-13 — End: 2021-10-18

## 2021-10-12 MED ORDER — LIDOCAINE HCL (PF) 1 % IJ SOLN
INTRAMUSCULAR | Status: DC | PRN
  Administered 2021-10-12: 15 mL

## 2021-10-12 MED ORDER — HEPARIN (PORCINE) IN NACL 1000-0.9 UT/500ML-% IV SOLN
INTRAVENOUS | Status: DC | PRN
  Administered 2021-10-12 (×2): 500 mL

## 2021-10-12 MED ORDER — IODIXANOL 320 MG/ML IV SOLN
INTRAVENOUS | Status: DC | PRN
  Administered 2021-10-12: 175 mL

## 2021-10-12 MED ORDER — CLOPIDOGREL BISULFATE 75 MG PO TABS
75.0000 mg | ORAL_TABLET | Freq: Every day | ORAL | Status: DC
  Administered 2021-10-13 – 2021-10-18 (×6): 75 mg via ORAL
  Filled 2021-10-12 (×6): qty 1

## 2021-10-12 MED ORDER — SODIUM CHLORIDE 0.9 % WEIGHT BASED INFUSION
1.0000 mL/kg/h | INTRAVENOUS | Status: AC
  Administered 2021-10-12: 1 mL/kg/h via INTRAVENOUS

## 2021-10-12 MED ORDER — MIDAZOLAM HCL 2 MG/2ML IJ SOLN
INTRAMUSCULAR | Status: AC
  Filled 2021-10-12: qty 2

## 2021-10-12 MED ORDER — HYDRALAZINE HCL 20 MG/ML IJ SOLN: 5.0000 mg | INTRAMUSCULAR | Status: DC | PRN

## 2021-10-12 MED ORDER — HEPARIN SODIUM (PORCINE) 1000 UNIT/ML IJ SOLN
INTRAMUSCULAR | Status: DC | PRN
  Administered 2021-10-12: 8000 [IU] via INTRAVENOUS
  Administered 2021-10-12: 4000 [IU] via INTRAVENOUS

## 2021-10-12 MED ORDER — CLOPIDOGREL BISULFATE 300 MG PO TABS
ORAL_TABLET | ORAL | Status: DC | PRN
  Administered 2021-10-12: 300 mg via ORAL

## 2021-10-12 MED ORDER — HEPARIN SODIUM (PORCINE) 5000 UNIT/ML IJ SOLN
5000.0000 [IU] | Freq: Three times a day (TID) | INTRAMUSCULAR | Status: DC
  Administered 2021-10-12 – 2021-10-17 (×15): 5000 [IU] via SUBCUTANEOUS
  Filled 2021-10-12 (×15): qty 1

## 2021-10-12 MED ORDER — SODIUM CHLORIDE 0.9 % IV SOLN: INTRAVENOUS | Status: DC

## 2021-10-12 MED ORDER — FENTANYL CITRATE (PF) 100 MCG/2ML IJ SOLN
INTRAMUSCULAR | Status: DC | PRN
  Administered 2021-10-12: 50 ug via INTRAVENOUS

## 2021-10-12 MED ORDER — ONDANSETRON HCL 4 MG/2ML IJ SOLN: 4.0000 mg | Freq: Four times a day (QID) | INTRAMUSCULAR | Status: DC | PRN

## 2021-10-12 MED ORDER — CLOPIDOGREL BISULFATE 75 MG PO TABS
ORAL_TABLET | ORAL | Status: AC
  Filled 2021-10-12: qty 4

## 2021-10-12 SURGICAL SUPPLY — 23 items
BALLN COYOTE OTW 2X220X150 (BALLOONS) ×3
BALLOON COYOTE OTW 2X220X150 (BALLOONS) IMPLANT
CATH AURYON 4FR ATHEREC 0.9 (CATHETERS) ×1 IMPLANT
CATH CXI 2.3F 150 ST (CATHETERS) ×1 IMPLANT
CATH OMNI FLUSH 5F 65CM (CATHETERS) ×1 IMPLANT
CATH QUICKCROSS .018X135CM (MICROCATHETER) ×1 IMPLANT
CLOSURE PERCLOSE PROSTYLE (VASCULAR PRODUCTS) ×1 IMPLANT
DCB RANGER 5.0X150 150 (BALLOONS) IMPLANT
GLIDEWIRE ADV .035X260CM (WIRE) ×1 IMPLANT
GUIDEWIRE ZILIENT 12G 014X300 (WIRE) ×1 IMPLANT
KIT ENCORE 26 ADVANTAGE (KITS) ×1 IMPLANT
KIT MICROPUNCTURE NIT STIFF (SHEATH) ×1 IMPLANT
KIT PV (KITS) ×4 IMPLANT
RANGER DCB 5.0X150 150 (BALLOONS) ×3
SHEATH PINNACLE 5F 10CM (SHEATH) ×1 IMPLANT
SHEATH PINNACLE ST 6F 65CM (SHEATH) ×1 IMPLANT
SHEATH PROBE COVER 6X72 (BAG) ×1 IMPLANT
STENT ELUVIA 6X40X130 (Permanent Stent) ×1 IMPLANT
SYR MEDRAD MARK V 150ML (SYRINGE) ×1 IMPLANT
TRANSDUCER W/STOPCOCK (MISCELLANEOUS) ×4 IMPLANT
TRAY PV CATH (CUSTOM PROCEDURE TRAY) ×4 IMPLANT
WIRE BENTSON .035X145CM (WIRE) ×1 IMPLANT
WIRE G V18X300CM (WIRE) ×1 IMPLANT

## 2021-10-12 NOTE — Progress Notes (Signed)
°  Daily Progress Note   Subjective: Doing well. No complaints.  Objective: Vitals:   10/11/21 2143 10/12/21 0819  BP: 119/70 119/76  Pulse: 81 70  Resp: 16   Temp: 98.8 F (37.1 C) 99 F (37.2 C)  SpO2: 97%     Physical Examination Ulceration to left TMA, Bone exposed Femoral pulses palpable  ASSESSMENT/PLAN:  Patient presents with left lower extremity [place] 5 critical limb ischemia Had a long conversation with Lexton about his vascular disease as well as his ulceration. Without revascularization, Lillard will need a below-knee amputation versus above-knee amputation After discussing the risk and benefits of left lower extremity angiography in an effort to define and improve lower extremity perfusion for wound healing, Myreon elected to proceed.   Cassandria Santee MD MS [facility] [phone] 10/12/2021  10:34 AM

## 2021-10-12 NOTE — Plan of Care (Signed)
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
## 2021-10-12 NOTE — Op Note (Addendum)
Patient name: Seth [name] MRN: [id number] DOB: 20-Jul-1956 Sex: male  10/12/2021 Pre-operative Diagnosis: Left lower extremity [place] 5 critical limb ischemia Post-operative diagnosis:  Same Surgeon:  Broadus John, MD Procedure Performed: 1.  Ultrasound-guided micropuncture access of the right common femoral artery 2.  Aortogram 3.  Aortic cannulation, left lower extremity angiogram 4.  Laser atherectomy of the left anterior tibial artery using the 0.9 a neuron laser 5.  2 x 263mm balloon angioplasty of the left anterior tibial artery 6.  5 x 175mm drug-coated balloon angioplasty of the superficial femoral artery 7.  6 x 18mm drug-coated stenting of the superficial femoral artery 8.  Device assisted closure -Pro-glide   Indications: Patient is a 66 year old gentleman with known peripheral arterial disease who has a history of left lower extremity intervention including left popliteal artery stenting, atherectomy, left transmetatarsal amputation.  He presented to the ED with an ulceration on his transmetatarsal amputation stump.  Bone was appreciated in the wound bed.  ABIs demonstrated significant peripheral arterial disease with monophasic waveforms in the dorsalis pedis and posterior tibial artery.  After discussing the risk and benefits of left lower extremity angiography in an effort to define and improve distal perfusion for wound healing, Daran elected to proceed.  Findings:  Aortogram: Patent bilateral renal arteries, normal infrarenal abdominal aorta, no flow-limiting stenosis within the iliac system bilaterally  On the right: Normal common femoral artery normal profunda.  The superficial femoral artery demonstrated greater than 50% stenosis at its distal aspect prior to the previously placed above-knee popliteal artery stent.  The popliteal artery demonstrated severe disease with an area of focal stenosis less than 50%.  The anterior tibial artery was patent to the mid  tibia with short segment occlusion.  It was underfilled but ran off into the foot via the dorsalis pedis artery.  The peroneal artery was occluded 2 cm from its ostia, with reconstitution at the ankle through collaterals.  The posterior tibial artery was occluded at its ostia.   Procedure:  The patient was identified in the holding area and taken to room 8.  The patient was then placed supine on the table and prepped and draped in the usual sterile fashion.  A time out was called.  Ultrasound was used to evaluate the right common femoral artery.  It was patent .  A digital ultrasound image was acquired.  A micropuncture needle was used to access the right common femoral artery under ultrasound guidance.  An 018 wire was advanced without resistance and a micropuncture sheath was placed.  The 018 wire was removed and a benson wire was placed.  The micropuncture sheath was exchanged for a 5 french sheath.  An omniflush catheter was advanced over the wire to the level of L-1.  An abdominal angiogram was obtained.  Next, using the omniflush catheter and a benson wire, the aortic bifurcation was crossed and the catheter was placed into theleft external iliac artery and left runoff was obtained.   I made the decision to intervene on the anterior tibial artery.  The patient was heparinized and a 6 x 65 cm catheter was positioned in the left superficial femoral artery.  Using a series of 014 wires and catheters, the anterior tibial artery was traversed and the wire was positioned in the distal dorsalis pedis artery.  Next, due to the small size of the artery, I elected to use the [place] 0.77mm laser to improve luminal gain prior to balloon angioplasty.  Following laser atherectomy, a 2 x 200 mm balloon was inflated for 3 minutes along the length of the anterior tibial artery.  Postangioplasty completion angiography demonstrated an excellent result with inline flow to the foot.  The artery remained small, and demonstrated  significant disease, however there was inline flow to the foot.  Next, my attention turned to the superficial femoral artery and popliteal artery.  The popliteal artery lesion was measured at less than 50% and did not appear flow-limiting, therefore I elected to leave this area as I was worried that balloon angioplasty could lead to dissection.  I did not want to stent this area.  In the distal superficial femoral artery a 5 x 150 mm drug-coated Ranger balloon was used.  This was inflated for 3 minutes.  Follow-up angiography demonstrated resolution of the flow-limiting stenosis, however a dissection was appreciated at the proximal aspect of balloon inflation.  I elected to stent this area with a 6 x 40 mm Eluvia stent and chose not to post-dilate as the artery was fragile, and I did not want to cause another dissection.  Post stenting angiography demonstrated excellent result with resolution of the dissection.   The right common femoral artery was closed with the use of a Proglide device without issue.   Impression: Single-vessel inline flow to the foot via the anterior tibial artery.  Resolution of flow-limiting stenosis in the superficial femoral artery using a 6 x 40 Eluvia stent, resolution of anterior tibial artery occlusion using laser atherectomy, 2x274mm balloon angioplasty. I did not treat the less than 50% stenosis appreciated in the popliteal artery.  Patient will need TMA debridement with Dr. Sharol Given, orthopedic surgery.  I will call and inform him of these results. Pt started on DAPT therapy.   Cassandria Santee, MD [facility] Office: [phone]

## 2021-10-12 NOTE — Progress Notes (Signed)
Slight bloody ooze from right groin site. Pressure held 15 minutes and was still oozing when rechecked. MD to see pt. Pressure to be held again for 15 minutes.

## 2021-10-12 NOTE — Progress Notes (Signed)
PROGRESS NOTE  Seth [name]  DOB: November 07, 1955  PCP: Seth Lei, MD ZHY:[id number]  DOA: 10/10/2021  LOS: 2 days  Hospital Day: 3  Brief narrative: Seth [name] is a 66 y.o. male with PMH significant for stroke with residual right-sided weakness, subdural hematoma, seizure, hypertension, PAD with history of left transmetatarsal amputation BPH, gout, depression. Patient presented to the ED on 2/13 with complaint of worsening left foot wound. On 09/22/2020, patient had left transmetatarsal amputation for amputation done by Dr. Sharol [name].  Prior to that on 09/20/2020, patient had arteriogram and angioplasty status post laser atherectomy with balloon angioplasty and stent placement on his left popliteal artery done by Dr. Donzetta [name].   For the last few days, patient noticed purulent drainage from left foot wound.  No new trauma.  In the ED, he was afebrile.  His wound showed exposed bone. Labs with normal WBC count Left foot x-ray showed interval transmetatarsal amputation with bone destruction at the stump of the first metatarsal highly suspicious for osteomyelitis.   He was started on IV vancomycin and Zosyn  Admitted to hospitalist service for further evaluation management  Subjective: Patient was seen and examined this morning.  Elderly African-American male thin built.  Not in distress Underwent angiogram and stenting of left lower extremity today. Hemodynamically stable Labs this morning with hemoglobin down to 9.4.  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 Sharol [name] for osteomyelitis -Orthopedics consulted.  Pending amputation. -Currently on IV vancomycin, IV Rocephin and IV Flagyl.  Peripheral artery disease  -Sees vascular surgery as an outpatient. -Today 2/15, patient underwent left lower extremity angiogram, left anterotibial artery atherectomy and balloon angioplasty with stenting. -Continue aspirin, Plavix and statin as before.  Stroke with residual right-sided weakness, History of subdural hematoma -Continue aspirin, Plavix and statin as before.   Hx of Seizure -He was previously admitted in 9/22 with focal status epilepticus.  Last seen by neurology on 09/01/2021. -continue Keppra 1000mg , Vimpat 100mg  BID, phenobarb 32.4mg  in the morning and 68.4mg  in the evening    Hypertension Controlled on metoprolol 50 mg twice daily  Anemia of chronic disease -Baseline hemoglobin over 10.  Currently at baseline.  Continue to monitor -Continue iron, vitamin B12  Mobility: Needs PT eval postprocedure Goals of care   Code Status: Full Code    Nutritional status:  There is no height or weight on file to calculate BMI.      Diet:  Diet Order             Diet Heart Room service appropriate? Yes with Assist; Fluid consistency: Thin  Diet effective now                   DVT prophylaxis:  heparin injection 5,000 Units Start: 10/12/21 2200 SCDs Start: 10/10/21 2021   Antimicrobials: IV ceftriaxone, Flagyl, vancomycin Fluid: None Consultants: Orthopedics, vascular surgery Family Communication: None at bedside  Status is: Inpatient  Continue in-hospital care because: Pending amputation Level of care: Progressive Cardiac   Dispo: The patient  is from: Home              Anticipated d/c is to: Pending PT eval postprocedure              Patient currently is not medically stable to d/c.   Difficult to place patient No     Infusions:   sodium chloride 100 mL/hr at 10/12/21 1043   [START  ON 10/13/2021] sodium chloride     sodium chloride     cefTRIAXone (ROCEPHIN)  IV 2 g (10/11/21 2139)   metronidazole 500 mg (10/12/21 0052)   vancomycin 1,750 mg (10/11/21 1730)    Scheduled Meds:  aspirin EC  81 mg Oral Daily   [START ON 10/13/2021] clopidogrel  75 mg Oral Q breakfast   heparin  5,000 Units Subcutaneous Q8H   lacosamide  100 mg Oral BID   levETIRAcetam  1,500 mg Oral BID   phenobarbital  32.4 mg Oral Daily   PHENobarbital  64.8 mg Oral QHS   [START ON 10/13/2021] sodium chloride flush  3 mL Intravenous Q12H    PRN meds:    Antimicrobials: Anti-infectives (From admission, onward)    Start     Dose/Rate Route Frequency Ordered Stop   10/11/21 0000  cefTRIAXone (ROCEPHIN) 2 g in sodium chloride 0.9 % 100 mL IVPB        2 g 200 mL/hr over 30 Minutes Intravenous Every 24 hours 10/10/21 1958     10/11/21 0000  metroNIDAZOLE (FLAGYL) IVPB 500 mg        500 mg 100 mL/hr over 60 Minutes Intravenous Every 12 hours 10/10/21 1958     10/10/21 1830  vancomycin (VANCOREADY) IVPB 1750 mg/350 mL        1,750 mg 175 mL/hr over 120 Minutes Intravenous Every 24 hours 10/10/21 1755     10/10/21 1745  piperacillin-tazobactam (ZOSYN) IVPB 3.375 g        3.375 g 100 mL/hr over 30 Minutes Intravenous  Once 10/10/21 1739 10/10/21 1843       Objective: Vitals:   10/12/21 1240 10/12/21 1322  BP: 129/81 (!) 155/96  Pulse: 85 84  Resp: 18 16  Temp:  98.1 F (36.7 C)  SpO2: 97% 100%    Intake/Output Summary (Last 24 hours) at 10/12/2021 1438 Last data filed at 10/12/2021 0756 Gross per 24 hour  Intake 100.5 ml  Output 550 ml  Net -449.5 ml    There were no vitals filed for this visit. Weight change:  There is no height or weight on file to calculate BMI.   Physical Exam: General exam: Pleasant, elderly African-American male.  Not in physical distress Skin: No rashes, lesions or ulcers. HEENT: Atraumatic, normocephalic, no obvious bleeding Lungs: Clear to  auscultation bilaterally CVS: Regular rate and rhythm, no murmur GI/Abd soft, nontender, nondistended, bowel sound present CNS: Alert, awake, oriented to place only.  Seems to have memory issues Psychiatry: Mood appropriate Extremities: No pedal edema, no calf tenderness, left leg with previous transmetatarsal amputation with infected wound  Data Review: I have personally reviewed the laboratory data and studies available.  F/u labs  Unresulted Labs (From admission, onward)     Start     Ordered   10/12/21 0500  CBC with Differential/Platelet  Daily,   R      10/11/21 1508   10/12/21 3976  Basic metabolic panel  Daily,   R      10/11/21 1508   10/10/21 1536  Urine Culture  (  Undifferentiated presentation (screening labs and basic nursing orders))  ONCE - STAT,   STAT       Question:  Indication  Answer:  Sepsis   10/10/21 1536   Signed and Held  Lipid panel  (Labs - [name] only)  Tomorrow morning,   R        Signed and Held            Signed, Terrilee Croak, MD [facility] 10/12/2021

## 2021-10-12 NOTE — Progress Notes (Signed)
Pt arrived to 4e form cath lab. Pt oriented to room and staff. Vitals obtained. CHG bath done. Right groin level 0. Pt denies needs at this time.

## 2021-10-13 DIAGNOSIS — M86272 Subacute osteomyelitis, left ankle and foot: Secondary | ICD-10-CM | POA: Diagnosis not present

## 2021-10-13 LAB — CBC WITH DIFFERENTIAL/PLATELET
Abs Immature Granulocytes: 0.05 10*3/uL (ref 0.00–0.07)
Basophils Absolute: 0 10*3/uL (ref 0.0–0.1)
Basophils Relative: 1 %
Eosinophils Absolute: 0.2 10*3/uL (ref 0.0–0.5)
Eosinophils Relative: 3 %
HCT: 31.8 % — ABNORMAL LOW (ref 39.0–52.0)
Hemoglobin: 10.3 g/dL — ABNORMAL LOW (ref 13.0–17.0)
Immature Granulocytes: 1 %
Lymphocytes Relative: 17 %
Lymphs Abs: 1.2 10*3/uL (ref 0.7–4.0)
MCH: 25.5 pg — ABNORMAL LOW (ref 26.0–34.0)
MCHC: 32.4 g/dL (ref 30.0–36.0)
MCV: 78.7 fL — ABNORMAL LOW (ref 80.0–100.0)
Monocytes Absolute: 0.9 10*3/uL (ref 0.1–1.0)
Monocytes Relative: 12 %
Neutro Abs: 4.8 10*3/uL (ref 1.7–7.7)
Neutrophils Relative %: 66 %
Platelets: 497 10*3/uL — ABNORMAL HIGH (ref 150–400)
RBC: 4.04 MIL/uL — ABNORMAL LOW (ref 4.22–5.81)
RDW: 16.1 % — ABNORMAL HIGH (ref 11.5–15.5)
WBC: 7.2 10*3/uL (ref 4.0–10.5)
nRBC: 0 % (ref 0.0–0.2)

## 2021-10-13 LAB — BASIC METABOLIC PANEL
Anion gap: 11 (ref 5–15)
BUN: 10 mg/dL (ref 8–23)
CO2: 19 mmol/L — ABNORMAL LOW (ref 22–32)
Calcium: 8.9 mg/dL (ref 8.9–10.3)
Chloride: 103 mmol/L (ref 98–111)
Creatinine, Ser: 0.92 mg/dL (ref 0.61–1.24)
GFR, Estimated: >60 mL/min (ref >60)
Glucose, Bld: 82 mg/dL (ref 70–99)
Potassium: 4.3 mmol/L (ref 3.5–5.1)
Sodium: 133 mmol/L — ABNORMAL LOW (ref 135–145)

## 2021-10-13 LAB — LIPID PANEL
Cholesterol: 115 mg/dL (ref 0–200)
HDL: 42 mg/dL (ref >40)
LDL Cholesterol: 63 mg/dL (ref 0–99)
Total CHOL/HDL Ratio: 2.7 RATIO
Triglycerides: 50 mg/dL (ref <150)
VLDL: 10 mg/dL (ref 0–40)

## 2021-10-13 LAB — LACTIC ACID, PLASMA: Lactic Acid, Venous: 0.8 mmol/L (ref 0.5–1.9)

## 2021-10-13 LAB — SURGICAL PCR SCREEN
MRSA, PCR: NEGATIVE
Staphylococcus aureus: NEGATIVE

## 2021-10-13 MED ORDER — JUVEN PO PACK
1.0000 | PACK | Freq: Two times a day (BID) | ORAL | Status: DC
  Administered 2021-10-13: 22:00:00 1 via ORAL
  Filled 2021-10-13: qty 1

## 2021-10-13 MED ORDER — CEFAZOLIN SODIUM-DEXTROSE 2-4 GM/100ML-% IV SOLN: 2.0000 g | INTRAVENOUS | Status: DC

## 2021-10-13 MED ORDER — ENSURE ENLIVE PO LIQD
237.0000 mL | Freq: Two times a day (BID) | ORAL | Status: DC
  Administered 2021-10-13 – 2021-10-18 (×9): 237 mL via ORAL

## 2021-10-13 MED ORDER — VANCOMYCIN HCL 1250 MG/250ML IV SOLN
1250.0000 mg | Freq: Two times a day (BID) | INTRAVENOUS | Status: DC
Start: 2021-10-13 — End: 2021-10-15
  Administered 2021-10-13 – 2021-10-15 (×5): 1250 mg via INTRAVENOUS
  Filled 2021-10-13 (×6): qty 250

## 2021-10-13 MED ORDER — CHLORHEXIDINE GLUCONATE 4 % EX LIQD
60.0000 mL | Freq: Once | CUTANEOUS | Status: AC
  Administered 2021-10-14: 4 via TOPICAL
  Filled 2021-10-13: qty 60

## 2021-10-13 MED ORDER — POVIDONE-IODINE 10 % EX SWAB: 2.0000 "application " | Freq: Once | CUTANEOUS | Status: DC

## 2021-10-13 MED ORDER — ADULT MULTIVITAMIN W/MINERALS CH
1.0000 | ORAL_TABLET | Freq: Every day | ORAL | Status: DC
  Administered 2021-10-13 – 2021-10-18 (×5): 1 via ORAL
  Filled 2021-10-13 (×6): qty 1

## 2021-10-13 MED ORDER — ROSUVASTATIN CALCIUM 20 MG PO TABS
20.0000 mg | ORAL_TABLET | Freq: Every day | ORAL | Status: DC
  Administered 2021-10-13 – 2021-10-18 (×6): 20 mg via ORAL
  Filled 2021-10-13 (×6): qty 1

## 2021-10-13 NOTE — H&P (View-Only) (Signed)
Patient ID: Seth [name], male   DOB: 03-Jul-1956, 66 y.o.   MRN: [id number] Patient is postoperative day 1 revascularization to the left lower extremity.  I will plan on proceeding with a first ray amputation left transmetatarsal amputation tomorrow Friday.  The necrotic ulcer over the first ray does look much better this morning than on initial presentation.

## 2021-10-13 NOTE — Care Management Important Message (Signed)
Important Message  Patient Details  Name: TIJUAN DANTES MRN: [id number] Date of Birth: 02/28/1956   Medicare Important Message Given:  Yes     [name] 10/13/2021, 9:28 AM

## 2021-10-13 NOTE — Progress Notes (Signed)
Mobility Specialist Progress Note   10/13/21 1215  Mobility  Activity Ambulated with assistance in room;Ambulated with assistance in hallway  Level of Assistance Minimal assist, patient does 75% or more  Assistive Device Front wheel [place]  Distance Ambulated (ft) 20 ft  Activity Response Tolerated well  $Mobility charge 1 Mobility   Pt c/o being generally sore all over but agreeable to mobility. Upon standing pt unknowingly voided on floor. After clean, ambulated w/ out any complaint and returned BTB w/ inc time d/t soreness. Call bell in reach and bed alarm on.   Holland Falling Mobility Specialist Phone Number [phone]

## 2021-10-13 NOTE — Progress Notes (Signed)
Patient ID: Seth [name], male   DOB: 01/27/1956, 66 y.o.   MRN: [id number] Patient is postoperative day 1 revascularization to the left lower extremity.  I will plan on proceeding with a first ray amputation left transmetatarsal amputation tomorrow Friday.  The necrotic ulcer over the first ray does look much better this morning than on initial presentation.

## 2021-10-13 NOTE — Progress Notes (Signed)
PROGRESS NOTE  Seth [name]  DOB: 01/24/1956  PCP: Lucianne Lei, MD [id number]  DOA: 10/10/2021  LOS: 3 days  Hospital Day: 4  Brief narrative: Seth [name] is a 66 y.o. male with PMH significant for stroke with residual right-sided weakness, subdural hematoma, seizure, hypertension, PAD with history of left transmetatarsal amputation BPH, gout, depression. Patient presented to the ED on 2/13 with complaint of worsening left foot wound. On 09/22/2020, patient had left transmetatarsal amputation for amputation done by Dr. Sharol Given.  Prior to that on 09/20/2020, patient had arteriogram and angioplasty status post laser atherectomy with balloon angioplasty and stent placement on his left popliteal artery done by Dr. Donzetta Matters.   For the last few days, patient noticed purulent drainage from left foot wound.  No new trauma.  In the ED, he was afebrile.  His wound showed exposed bone. Labs with normal WBC count Left foot x-ray showed interval transmetatarsal amputation with bone destruction at the stump of the first metatarsal highly suspicious for osteomyelitis.   He was started on IV vancomycin and Zosyn  Admitted to hospitalist service for further evaluation management  Subjective: Patient was seen and examined this morning.  Elderly African-American male thin built.  Not in distress Lying on bed.  Not in distress.  Pending left metatarsal amputation by Dr. Sharol Given tomorrow.  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 Sharol Given for  osteomyelitis -Orthopedics consulted.  Pending left metatarsal amputation tomorrow. -Currently on IV vancomycin, IV Rocephin and IV Flagyl.  Peripheral artery disease  -Sees vascular surgery -2/15, patient underwent left lower extremity angiogram, left anterotibial artery atherectomy and balloon angioplasty with stenting. -Continue aspirin, Plavix and statin as before.  Stroke with residual right-sided weakness, History of subdural hematoma -Continue aspirin, Plavix and statin as before. 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   Hx of Seizure -He was previously admitted in 9/22 with focal status epilepticus.  Last seen by neurology on 09/01/2021. -continue Keppra 1000mg , Vimpat 100mg  BID, phenobarb 32.4mg  in the morning and 68.4mg  in the evening    Hypertension Controlled on metoprolol 50 mg twice daily  Anemia of chronic disease -Baseline hemoglobin over 10.  Currently at baseline.  Continue to monitor -Continue iron, vitamin B12  Mobility: Needs PT eval postprocedure Goals of care   Code Status: Full Code    Nutritional status:  Body mass index is 21.14 kg/m.  Nutrition Problem: Moderate Malnutrition Etiology: chronic illness (PAD) Signs/Symptoms: mild fat depletion, mild muscle depletion Diet:  Diet Order             Diet NPO time specified  Diet effective ____           Diet Heart Room service appropriate? No; Fluid consistency: Thin  Diet effective now                   DVT prophylaxis:  heparin injection 5,000 Units Start: 10/12/21 2200 SCDs Start: 10/10/21 2021   Antimicrobials: IV ceftriaxone, Flagyl, vancomycin Fluid: None Consultants: Orthopedics, vascular surgery  Family Communication: None at bedside  Status is: Inpatient  Continue in-hospital care because: Pending blood transplant consultation tomorrow Level of care: Med-Surg   Dispo: The patient is from: Home              Anticipated d/c is to: Pending PT eval  postprocedure              Patient currently is not medically stable to d/c.   Difficult to place patient No     Infusions:   sodium chloride 100 mL/hr at 10/12/21 1043   sodium chloride      ceFAZolin (ANCEF) IV     cefTRIAXone (ROCEPHIN)  IV 2 g (10/12/21 2229)   metronidazole 500 mg (10/13/21 1246)   vancomycin 1,250 mg (10/13/21 1102)    Scheduled Meds:  aspirin EC  81 mg Oral Daily   chlorhexidine  60 mL Topical Once   clopidogrel  75 mg Oral Q breakfast   feeding supplement  237 mL Oral BID BM   heparin  5,000 Units Subcutaneous Q8H   lacosamide  100 mg Oral BID   levETIRAcetam  1,500 mg Oral BID   multivitamin with minerals  1 tablet Oral Daily   nutrition supplement (JUVEN)  1 packet Oral BID BM   phenobarbital  32.4 mg Oral Daily   PHENobarbital  64.8 mg Oral QHS   povidone-iodine  2 application Topical Once   rosuvastatin  20 mg Oral Daily   sodium chloride flush  3 mL Intravenous Q12H    PRN meds:    Antimicrobials: Anti-infectives (From admission, onward)    Start     Dose/Rate Route Frequency Ordered Stop   10/13/21 1330  ceFAZolin (ANCEF) IVPB 2g/100 mL premix        2 g 200 mL/hr over 30 Minutes Intravenous On call to O.R. 10/13/21 1318 10/14/21 0559   10/13/21 1000  vancomycin (VANCOREADY) IVPB 1250 mg/250 mL        1,250 mg 166.7 mL/hr over 90 Minutes Intravenous Every 12 hours 10/13/21 0839     10/11/21 0000  cefTRIAXone (ROCEPHIN) 2 g in sodium chloride 0.9 % 100 mL IVPB        2 g 200 mL/hr over 30 Minutes Intravenous Every 24 hours 10/10/21 1958     10/11/21 0000  metroNIDAZOLE (FLAGYL) IVPB 500 mg        500 mg 100 mL/hr over 60 Minutes Intravenous Every 12 hours 10/10/21 1958     10/10/21 1830  vancomycin (VANCOREADY) IVPB 1750 mg/350 mL  Status:  Discontinued        1,750 mg 175 mL/hr over 120 Minutes Intravenous Every 24 hours 10/10/21 1755 10/13/21 0839   10/10/21 1745  piperacillin-tazobactam (ZOSYN) IVPB 3.375 g        3.375 g 100  mL/hr over 30 Minutes Intravenous  Once 10/10/21 1739 10/10/21 1843       Objective: Vitals:   10/13/21 0327 10/13/21 0803  BP: 138/82 127/77  Pulse: (!) 58 87  Resp: 20 17  Temp: 98.6 F (37 C) 98.7 F (37.1 C)  SpO2: 97% 100%    Intake/Output Summary (Last 24 hours) at 10/13/2021 1332 Last data filed at 10/13/2021 0851 Gross per 24 hour  Intake 3211.32 ml  Output 725 ml  Net 2486.32 ml    Filed Weights   10/12/21 1400  Weight: 76.7 kg   Weight change:  Body mass index is 21.14 kg/m.   Physical Exam: General exam: Pleasant, elderly African-American  male.  Not in physical distress Skin: No rashes, lesions or ulcers. HEENT: Atraumatic, normocephalic, no obvious bleeding Lungs: Clear to auscultation bilaterally CVS: Regular rate and rhythm, no murmur GI/Abd soft, nontender, nondistended, bowel sound present CNS: Alert, awake, oriented to place only.  Seems to have memory issues Psychiatry: Mood appropriate Extremities: No pedal edema, no calf tenderness, left foot has dressing on  Data Review: I have personally reviewed the laboratory data and studies available.  F/u labs  Unresulted Labs (From admission, onward)     Start     Ordered   10/13/21 1320  Surgical pcr screen  Once,   R        10/13/21 1319   10/12/21 0500  CBC with Differential/Platelet  Daily,   R      10/11/21 1508   10/12/21 2229  Basic metabolic panel  Daily,   R      10/11/21 1508            Signed, Terrilee Croak, MD [facility] 10/13/2021

## 2021-10-13 NOTE — TOC Progression Note (Signed)
Transition of Care [facility]) - Progression Note    Patient Details  Name: Seth [name] MRN: [id number] Date of Birth: 07-08-1956  Transition of Care [facility]) CM/SW [place], [place] Phone Number: 10/13/2021, 3:01 PM  Clinical Narrative:     CSW met with patient at bedside. CSW introduced self and explained role. Patient confirmed he is from [facility] formally [place]. Patient plans on returning once is he medically stable.   TOC will continue to follow and assist with discharge planning.   Thurmond Butts, MSW, LCSW Clinical Social Worker    Expected Discharge Plan: Skilled Nursing Facility Barriers to Discharge: Continued Medical Work up  Expected Discharge Plan and Services Expected Discharge Plan: [place] In-house Referral: Clinical Social Work     Living arrangements for the past 2 months: [place]                                       Social Determinants of Health (SDOH) Interventions    Readmission Risk Interventions Readmission Risk Prevention Plan 06/04/2019  Transportation Screening Complete  PCP or Specialist Appt within 5-7 Days Complete  Home Care Screening Complete  Medication Review (RN CM) Complete  Some recent data might be hidden

## 2021-10-13 NOTE — Progress Notes (Addendum)
  Progress Note   10/13/2021 7:15 AM 1 Day Post-Op  Subjective:  no complaints  Tm 99.5 now afebrile  Vitals:   10/12/21 2313 10/13/21 0327  BP: (!) 149/86 138/82  Pulse: 93 (!) 58  Resp: 20 20  Temp: 99.2 F (37.3 C) 98.6 F (37 C)  SpO2: 100% 97%    Physical Exam: General:  no distress Lungs:  non labored Incisions:  right groin is soft Extremities:  brisk left AT/peroneal doppler signals   CBC    Component Value Date/Time   WBC 7.2 10/13/2021 0058   RBC 4.04 (L) 10/13/2021 0058   HGB 10.3 (L) 10/13/2021 0058   HGB 12.1 (L) 05/08/2018 1317   HCT 31.8 (L) 10/13/2021 0058   HCT 28.2 (L) 02/21/2019 0554   PLT 497 (H) 10/13/2021 0058   PLT 225 05/08/2018 1317   MCV 78.7 (L) 10/13/2021 0058   MCV 100 (H) 05/08/2018 1317   MCH 25.5 (L) 10/13/2021 0058   MCHC 32.4 10/13/2021 0058   RDW 16.1 (H) 10/13/2021 0058   RDW 17.5 (H) 05/08/2018 1317   LYMPHSABS 1.2 10/13/2021 0058   LYMPHSABS 1.7 05/08/2018 1317   MONOABS 0.9 10/13/2021 0058   EOSABS 0.2 10/13/2021 0058   EOSABS 0.2 05/08/2018 1317   BASOSABS 0.0 10/13/2021 0058   BASOSABS 0.0 05/08/2018 1317    BMET    Component Value Date/Time   NA 133 (L) 10/13/2021 0058   NA 145 (H) 05/08/2018 1317   K 4.3 10/13/2021 0058   CL 103 10/13/2021 0058   CO2 19 (L) 10/13/2021 0058   GLUCOSE 82 10/13/2021 0058   BUN 10 10/13/2021 0058   BUN 8 05/08/2018 1317   CREATININE 0.92 10/13/2021 0058   CALCIUM 8.9 10/13/2021 0058   GFRNONAA >60 10/13/2021 0058   GFRAA >60 06/03/2019 0514    INR    Component Value Date/Time   INR 1.1 10/10/2021 1545     Intake/Output Summary (Last 24 hours) at 10/13/2021 0715 Last data filed at 10/13/2021 0400 Gross per 24 hour  Intake 2971.32 ml  Output 1275 ml  Net 1696.32 ml     Assessment/Plan:  66 y.o. male is s/p:  Aortogram with laser atherectomy, balloon angioplasty of the left ATA,  drug coated balloon angioplasty and stenting of the SFA 1 Day Post-Op   -pt with  brisk left AT and peroneal doppler signals -left groin without hematoma -BUN/Cr normal this am after angiogram -for OR today with Dr. Sharol Given -plavix/asa   Leontine Locket, PA-C [facility] [phone] 10/13/2021 7:15 AM  VASCULAR STAFF ADDENDUM: I have independently interviewed and examined the patient. I agree with the above.  I spoke with Dr. Sharol Given - plan for TMA revision tomorrow  Optimally revascularized. Will see in 3 months.  Should his TMA revision not heal, he will need BKA   Cassandria Santee, MD [facility] Phone Number: [phone] 10/13/2021 7:43 AM

## 2021-10-13 NOTE — Progress Notes (Signed)
PHARMACIST LIPID MONITORING   Seth [name] is a 66 y.o. male admitted on 10/10/2021 with left lower extremity critical limb ischemia.  Pharmacy has been consulted to optimize lipid-lowering therapy with the indication of secondary prevention for clinical ASCVD.  Recent Labs:  Lipid Panel (last 6 months):   Lab Results  Component Value Date   CHOL 115 10/13/2021   TRIG 50 10/13/2021   HDL 42 10/13/2021   CHOLHDL 2.7 10/13/2021   VLDL 10 10/13/2021   LDLCALC 63 10/13/2021    Hepatic function panel (last 6 months):   Lab Results  Component Value Date   AST 20 10/10/2021   ALT 16 10/10/2021   ALKPHOS 108 10/10/2021   BILITOT 0.1 (L) 10/10/2021    SCr (since admission):   Serum creatinine: 0.92 mg/dL 10/13/21 0058 Estimated creatinine clearance: 88 mL/min  Current therapy and lipid therapy tolerance Current lipid-lowering therapy: Crestor 10mg /d Previous lipid-lowering therapies (if applicable):  Documented or reported allergies or intolerances to lipid-lowering therapies (if applicable): none noted  Assessment:   Increase Crestor from 10 to 20mg  daily  Plan:    1.Statin intensity (high intensity recommended for all patients regardless of the LDL):  Add or increase statin to high intensity.  2.Add ezetimibe (if any one of the following):   Not indicated at this time.  3.Refer to lipid clinic:   No  4.Follow-up with:  Primary care or vascular MD  5.Follow-up labs after discharge:  Changes in lipid therapy were made. Check a lipid panel in 8-12 weeks then annually.      Jalilah Wiltsie S. Alford Highland, PharmD, BCPS Clinical Staff Pharmacist [email or web address] Wayland Salinas, PharmD 10/13/2021, 8:28 AM

## 2021-10-13 NOTE — TOC Initial Note (Signed)
Transition of Care (TOC) - Initial/Assessment Note  Marvetta Gibbons RN, BSN Transitions of Care Unit 4E- RN Case Manager See Treatment Team for direct phone #    Patient Details  Name: Seth [name] MRN: [id number] Date of Birth: 07-22-1956  Transition of Care [facility] CM/SW Contact:    Dawayne Patricia, RN Phone Number: 10/13/2021, 2:21 PM  Clinical Narrative:                 Transition of Care Department [facility]) has reviewed patient and pt admitted from SNF- went to [facility] back in 9/22. Noted plan for OR with Dr. Sharol Given for L TMA. We will continue to monitor patient advancement through interdisciplinary progression rounds. If new patient transition needs arise, please place a TOC consult.     Expected Discharge Plan: Skilled Nursing Facility Barriers to Discharge: Continued Medical Work up   Patient Goals and CMS Choice        Expected Discharge Plan and Services Expected Discharge Plan: [place] In-house Referral: Clinical Social Work     Living arrangements for the past 2 months: [place]                                      Prior Living Arrangements/Services Living arrangements for the past 2 months: [place]                Current home services: DME (cane/[employer]/shower seat)    Activities of Daily Living Home Assistive Devices/Equipment: Wheelchair, Environmental consultant (specify type) ADL Screening (condition at time of admission) Patient's cognitive ability adequate to safely complete daily activities?: Yes Is the patient deaf or have difficulty hearing?: No Does the patient have difficulty seeing, even when wearing glasses/contacts?: No Does the patient have difficulty concentrating, remembering, or making decisions?: No Patient able to express need for assistance with ADLs?: Yes Does the patient have difficulty dressing or bathing?: No Independently performs ADLs?: Yes (appropriate for developmental age) Does  the patient have difficulty walking or climbing stairs?: No Weakness of Legs: Right Weakness of Arms/Hands: Right  Permission Sought/Granted                  Emotional Assessment              Admission diagnosis:  Osteomyelitis ([place]) [M86.9] Foot infection [L08.9] Peripheral arterial disease ([place]) [I73.9] Sepsis ([place]) [A41.9] 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 [facility] 2) [Z89.432] Patient Active Problem List   Diagnosis Date Noted   Foot infection    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 ([place])    Peripheral arterial disease ([place])    Osteomyelitis ([place]) 10/10/2021   Cerebral thrombosis with cerebral infarction 05/18/2021   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, not intractable, with status epilepticus ([place])    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[place])    Seizures ([place]) 05/17/2021   Fever 05/17/2021   Seizure disorder ([place]) 05/17/2021   Pressure injury of skin 03/14/2021   Seizure ([place]) 03/13/2021   SDH (subdural hematoma)    Encephalopathy acute    Protein-calorie malnutrition, severe 01/11/2021   Benign essential HTN    Benign prostatic hyperplasia    Depression    ETOH abuse    Hyponatremia    Nonsustained ventricular tachycardia    Alcohol use 01/01/2021   History of CVA (cerebrovascular accident) 01/01/2021  Sepsis ([place]) 09/14/2020   COVID-19 virus infection 09/14/2020   Osteomyelitis of left foot ([place])    AKI (acute kidney injury) ([place]) 14/38/8875   Metabolic acidosis 79/72/8206   Sinus tachycardia 05/31/2019   DOE (dyspnea on exertion) 05/31/2019   Generalized weakness 02/19/2019   Hypertensive urgency 02/19/2019   Left fibular fracture 02/19/2019   Chronic alcoholism ([place]) 02/19/2019   Malnutrition of moderate degree 08/25/2017   Acute respiratory failure with hypoxia ([place]) 08/23/2017   Chest pain 08/23/2017   Elevated blood pressure  reading 08/23/2017   Tobacco abuse 08/23/2017   COPD (chronic obstructive pulmonary disease) ([place]) 08/23/2017   PCP:  Lucianne Lei, MD Pharmacy:   [place] ([place]), [place] - [address] [place] [address] Phone: [phone] Fax: [phone]     Social Determinants of Health ([place]) Interventions    Readmission Risk Interventions Readmission Risk Prevention Plan 06/04/2019  Transportation Screening Complete  PCP or Specialist Appt within 5-7 Days Complete  Home Care Screening Complete  Medication Review (RN CM) Complete  Some recent data might be hidden

## 2021-10-13 NOTE — Progress Notes (Signed)
Initial Nutrition Assessment  DOCUMENTATION CODES:  Non-severe (moderate) malnutrition in context of chronic illness  INTERVENTION:  Add Ensure Plus High Protein po BID, each supplement provides 350 kcal and 20 grams of protein.   Add 1 packet Juven BID, each packet provides 95 calories, 2.5 grams of protein (collagen), and 9.8 grams of carbohydrate (3 grams sugar); also contains 7 grams of L-arginine and L-glutamine, 300 mg vitamin C, 15 mg vitamin E, 1.2 mcg vitamin B-12, 9.5 mg zinc, 200 mg calcium, and 1.5 g  Calcium Beta-hydroxy-Beta-methylbutyrate to support wound healing.  Add MVI with minerals daily.  Obtain updated measured weight.  Encourage PO and supplement intake.   NUTRITION DIAGNOSIS:  Moderate Malnutrition related to chronic illness (PAD) as evidenced by mild fat depletion, mild muscle depletion.  GOAL:  Patient will meet greater than or equal to 90% of their needs  MONITOR:  PO intake, Supplement acceptance, Labs, Weight trends, Skin, I & O's  REASON FOR ASSESSMENT:  Malnutrition Screening Tool    ASSESSMENT:  66 yo male with a PMH of stroke with residual right-sided weakness, SDH, seizure, HTN, PAD with history of left transmetatarsal amputation, BPH, gout, and depression who presented to the ED on 2/13 with complaint of worsening left foot wound. Admitted with osteomyelitis. 2/15 - peripheral vascular atherectomy and intervention  Per Ortho Surgery, plan for a first ray amputation left transmetatarsal amputation on Friday, 2/17.  Spoke with pt at bedside. Pt reports having a good appetite and eating okay at home - despite being asked what he usually eats PTA, pt unable to elaborate.  Per Epic meal documentation, pt ate 100% of breakfast this morning.   Pt reports a 4 lb weight loss in 2 weeks. RD to obtain new measured weight to determine validity of this change.  Per Epic, pt's weight appears stable, but likely an error weight on 09/07/21, given weight was  over 20 kg higher than either weight around it.  Medications: reviewed; heparin, Keppra BID, NaCl @ 100 ml/hr, IV ABX x3  Labs: reviewed; Na 133 (L)  NUTRITION - FOCUSED PHYSICAL EXAM: Flowsheet Row Most Recent Value  Orbital Region Mild depletion  Upper Arm Region No depletion  Thoracic and Lumbar Region No depletion  Buccal Region Mild depletion  Temple Region Mild depletion  Clavicle Bone Region Mild depletion  Clavicle and Acromion Bone Region Mild depletion  Scapular Bone Region Mild depletion  Dorsal Hand Moderate depletion  Patellar Region Mild depletion  Anterior Thigh Region Mild depletion  Posterior Calf Region Mild depletion  Edema (RD Assessment) None  Hair Reviewed  Eyes Reviewed  Mouth Reviewed  Skin Reviewed  [Extremely dry]  Nails Reviewed   Diet Order:   Diet Order             Diet NPO time specified  Diet effective ____           Diet Heart Room service appropriate? No; Fluid consistency: Thin  Diet effective now                  EDUCATION NEEDS:  Education needs have been addressed  Skin:  Skin Assessment: Skin Integrity Issues: Skin Integrity Issues:: Incisions Incisions: L anterior foot, closed (2/14)  Last BM:  10/13/21  Height:  Ht Readings from Last 1 Encounters:  09/28/21 6\' 3"  (1.905 m)   Weight:  Wt Readings from Last 1 Encounters:  10/12/21 76.7 kg   BMI:  Body mass index is 21.14 kg/m.  Estimated Nutritional Needs:  Kcal:  2100-2300 Protein:  95-110 grams Fluid:  2.1 L  Derrel Nip, RD, LDN (she/her/hers) Clinical Inpatient Dietitian RD Pager/After-Hours/Weekend Pager # in [place]

## 2021-10-13 NOTE — Progress Notes (Signed)
Pharmacy Antibiotic Note  Seth [name] is a 66 y.o. male admitted on 10/10/2021 with  wound infection .  Pharmacy has been consulted for Vanco dosing.  ID: Osteomyelitis of the stump of the first metatarsal w/left TMA,  exposed bone and purulent drainage from the site of past metatarsal amputation  Tmax 99.5, WBC WNL, Scr <1  2/13 Zosyn x 1 2/13 vanc >> 2/13 Flagyl >> 2/13 CTX >>  Plan: Increase Vancomycin 1250 mg IV Q 12 hrs. Goal AUC 400-550. Expected AUC: 533 SCr used: 0.92   Weight: 76.7 kg (169 lb 1.5 oz)  Temp (24hrs), Avg:98.7 F (37.1 C), Min:97.8 F (36.6 C), Max:99.5 F (37.5 C)  Recent Labs  Lab 10/10/21 1545 10/10/21 2105 10/11/21 0101 10/12/21 0402 10/13/21 0058  WBC 8.6  --  7.3 6.9 7.2  CREATININE 1.19  --   --  1.04 0.92  LATICACIDVEN 2.3* 2.1*  --   --  0.8    Estimated Creatinine Clearance: 88 mL/min (by C-G formula based on SCr of 0.92 mg/dL).    No Known Allergies  Thank you for allowing pharmacy to be a part of this patients care.   Seth [name] S. Seth [name], PharmD, BCPS Clinical Staff Pharmacist [email or web address]  Seth [name] 10/13/2021 8:40 AM

## 2021-10-14 ENCOUNTER — Inpatient Hospital Stay (HOSPITAL_COMMUNITY): Payer: Medicare Other | Admitting: Anesthesiology

## 2021-10-14 ENCOUNTER — Encounter (HOSPITAL_COMMUNITY): Payer: Self-pay | Admitting: Family Medicine

## 2021-10-14 ENCOUNTER — Encounter (HOSPITAL_COMMUNITY)
Admission: EM | Disposition: A | Discharge status: Skilled Nursing Facility | Payer: Self-pay | Source: Home / Self Care | Attending: Student

## 2021-10-14 DIAGNOSIS — J449 Chronic obstructive pulmonary disease, unspecified: Secondary | ICD-10-CM | POA: Diagnosis not present

## 2021-10-14 DIAGNOSIS — I10 Essential (primary) hypertension: Secondary | ICD-10-CM | POA: Diagnosis not present

## 2021-10-14 DIAGNOSIS — F17200 Nicotine dependence, unspecified, uncomplicated: Secondary | ICD-10-CM

## 2021-10-14 DIAGNOSIS — R531 Weakness: Secondary | ICD-10-CM

## 2021-10-14 DIAGNOSIS — E440 Moderate protein-calorie malnutrition: Secondary | ICD-10-CM

## 2021-10-14 DIAGNOSIS — M869 Osteomyelitis, unspecified: Secondary | ICD-10-CM

## 2021-10-14 DIAGNOSIS — D649 Anemia, unspecified: Secondary | ICD-10-CM

## 2021-10-14 DIAGNOSIS — M86072 Acute hematogenous osteomyelitis, left ankle and foot: Secondary | ICD-10-CM | POA: Diagnosis not present

## 2021-10-14 HISTORY — PX: AMPUTATION: SHX166

## 2021-10-14 HISTORY — PX: APPLICATION OF WOUND VAC: SHX5189

## 2021-10-14 LAB — CBC WITH DIFFERENTIAL/PLATELET
Abs Immature Granulocytes: 0.02 10*3/uL (ref 0.00–0.07)
Basophils Absolute: 0 10*3/uL (ref 0.0–0.1)
Basophils Relative: 0 %
Eosinophils Absolute: 0.4 10*3/uL (ref 0.0–0.5)
Eosinophils Relative: 6 %
HCT: 31.1 % — ABNORMAL LOW (ref 39.0–52.0)
Hemoglobin: 9.7 g/dL — ABNORMAL LOW (ref 13.0–17.0)
Immature Granulocytes: 0 %
Lymphocytes Relative: 22 %
Lymphs Abs: 1.5 10*3/uL (ref 0.7–4.0)
MCH: 24.7 pg — ABNORMAL LOW (ref 26.0–34.0)
MCHC: 31.2 g/dL (ref 30.0–36.0)
MCV: 79.3 fL — ABNORMAL LOW (ref 80.0–100.0)
Monocytes Absolute: 0.9 10*3/uL (ref 0.1–1.0)
Monocytes Relative: 13 %
Neutro Abs: 4 10*3/uL (ref 1.7–7.7)
Neutrophils Relative %: 59 %
Platelets: 467 10*3/uL — ABNORMAL HIGH (ref 150–400)
RBC: 3.92 MIL/uL — ABNORMAL LOW (ref 4.22–5.81)
RDW: 16.2 % — ABNORMAL HIGH (ref 11.5–15.5)
WBC: 6.8 10*3/uL (ref 4.0–10.5)
nRBC: 0 % (ref 0.0–0.2)

## 2021-10-14 LAB — BASIC METABOLIC PANEL
Anion gap: 12 (ref 5–15)
BUN: 12 mg/dL (ref 8–23)
CO2: 19 mmol/L — ABNORMAL LOW (ref 22–32)
Calcium: 9.2 mg/dL (ref 8.9–10.3)
Chloride: 105 mmol/L (ref 98–111)
Creatinine, Ser: 0.95 mg/dL (ref 0.61–1.24)
GFR, Estimated: >60 mL/min (ref >60)
Glucose, Bld: 84 mg/dL (ref 70–99)
Potassium: 4.1 mmol/L (ref 3.5–5.1)
Sodium: 136 mmol/L (ref 135–145)

## 2021-10-14 SURGERY — AMPUTATION, FOOT, RAY
Anesthesia: Monitor Anesthesia Care | Laterality: Left

## 2021-10-14 MED ORDER — GUAIFENESIN-DM 100-10 MG/5ML PO SYRP: 15.0000 mL | ORAL_SOLUTION | ORAL | Status: DC | PRN

## 2021-10-14 MED ORDER — MAGNESIUM CITRATE PO SOLN
1.0000 | Freq: Once | ORAL | Status: DC | PRN
  Filled 2021-10-14: qty 296

## 2021-10-14 MED ORDER — ACETAMINOPHEN 325 MG PO TABS: 325.0000 mg | ORAL_TABLET | Freq: Four times a day (QID) | ORAL | Status: DC | PRN

## 2021-10-14 MED ORDER — POTASSIUM CHLORIDE CRYS ER 20 MEQ PO TBCR: 20.0000 meq | EXTENDED_RELEASE_TABLET | Freq: Every day | ORAL | Status: DC | PRN

## 2021-10-14 MED ORDER — METOPROLOL TARTRATE 5 MG/5ML IV SOLN: 2.0000 mg | INTRAVENOUS | Status: DC | PRN

## 2021-10-14 MED ORDER — MAGNESIUM SULFATE 2 GM/50ML IV SOLN: 2.0000 g | Freq: Every day | INTRAVENOUS | Status: DC | PRN

## 2021-10-14 MED ORDER — PROPOFOL 10 MG/ML IV BOLUS
INTRAVENOUS | Status: DC | PRN
  Administered 2021-10-14: 30 mg via INTRAVENOUS

## 2021-10-14 MED ORDER — HYDRALAZINE HCL 20 MG/ML IJ SOLN: 5.0000 mg | INTRAMUSCULAR | Status: DC | PRN

## 2021-10-14 MED ORDER — JUVEN PO PACK
1.0000 | PACK | Freq: Two times a day (BID) | ORAL | Status: DC
  Administered 2021-10-15 – 2021-10-18 (×8): 1 via ORAL
  Filled 2021-10-14 (×7): qty 1

## 2021-10-14 MED ORDER — FENTANYL CITRATE PF 50 MCG/ML IJ SOSY
50.0000 ug | PREFILLED_SYRINGE | Freq: Once | INTRAMUSCULAR | Status: AC
  Administered 2021-10-14: 50 ug via INTRAVENOUS
  Filled 2021-10-14: qty 1

## 2021-10-14 MED ORDER — SODIUM CHLORIDE 0.9 % IV SOLN: INTRAVENOUS | Status: DC

## 2021-10-14 MED ORDER — LABETALOL HCL 5 MG/ML IV SOLN: 10.0000 mg | INTRAVENOUS | Status: DC | PRN

## 2021-10-14 MED ORDER — DEXAMETHASONE SODIUM PHOSPHATE 4 MG/ML IJ SOLN
INTRAMUSCULAR | Status: DC | PRN
  Administered 2021-10-14: 5 mg via PERINEURAL

## 2021-10-14 MED ORDER — PROPOFOL 500 MG/50ML IV EMUL
INTRAVENOUS | Status: DC | PRN
Start: 2021-10-14 — End: 2021-10-14
  Administered 2021-10-14: 50 ug/kg/min via INTRAVENOUS

## 2021-10-14 MED ORDER — 0.9 % SODIUM CHLORIDE (POUR BTL) OPTIME
TOPICAL | Status: DC | PRN
  Administered 2021-10-14: 1000 mL

## 2021-10-14 MED ORDER — FENTANYL CITRATE (PF) 100 MCG/2ML IJ SOLN
INTRAMUSCULAR | Status: AC
  Filled 2021-10-14: qty 2

## 2021-10-14 MED ORDER — OXYCODONE HCL 5 MG PO TABS
5.0000 mg | ORAL_TABLET | ORAL | Status: DC | PRN
  Administered 2021-10-16: 10 mg via ORAL
  Filled 2021-10-14 (×3): qty 2

## 2021-10-14 MED ORDER — BISACODYL 5 MG PO TBEC: 5.0000 mg | DELAYED_RELEASE_TABLET | Freq: Every day | ORAL | Status: DC | PRN

## 2021-10-14 MED ORDER — CHLORHEXIDINE GLUCONATE 0.12 % MT SOLN
15.0000 mL | Freq: Once | OROMUCOSAL | Status: AC
  Administered 2021-10-14: 15 mL via OROMUCOSAL
  Filled 2021-10-14: qty 15

## 2021-10-14 MED ORDER — POLYETHYLENE GLYCOL 3350 17 G PO PACK: 17.0000 g | PACK | Freq: Every day | ORAL | Status: DC | PRN

## 2021-10-14 MED ORDER — LACTATED RINGERS IV SOLN: INTRAVENOUS | Status: DC

## 2021-10-14 MED ORDER — ZINC SULFATE 220 (50 ZN) MG PO CAPS
220.0000 mg | ORAL_CAPSULE | Freq: Every day | ORAL | Status: DC
  Administered 2021-10-14 – 2021-10-18 (×5): 220 mg via ORAL
  Filled 2021-10-14 (×5): qty 1

## 2021-10-14 MED ORDER — ORAL CARE MOUTH RINSE: 15.0000 mL | Freq: Once | OROMUCOSAL | Status: AC

## 2021-10-14 MED ORDER — ALUM & MAG HYDROXIDE-SIMETH 200-200-20 MG/5ML PO SUSP: 15.0000 mL | ORAL | Status: DC | PRN

## 2021-10-14 MED ORDER — DOCUSATE SODIUM 100 MG PO CAPS
100.0000 mg | ORAL_CAPSULE | Freq: Every day | ORAL | Status: DC
  Administered 2021-10-15 – 2021-10-18 (×3): 100 mg via ORAL
  Filled 2021-10-14 (×4): qty 1

## 2021-10-14 MED ORDER — PHENOL 1.4 % MT LIQD: 1.0000 | OROMUCOSAL | Status: DC | PRN

## 2021-10-14 MED ORDER — CLONIDINE HCL (ANALGESIA) 100 MCG/ML EP SOLN
EPIDURAL | Status: DC | PRN
  Administered 2021-10-14: 80 ug

## 2021-10-14 MED ORDER — ONDANSETRON HCL 4 MG/2ML IJ SOLN: 4.0000 mg | Freq: Four times a day (QID) | INTRAMUSCULAR | Status: DC | PRN

## 2021-10-14 MED ORDER — PANTOPRAZOLE SODIUM 40 MG PO TBEC
40.0000 mg | DELAYED_RELEASE_TABLET | Freq: Every day | ORAL | Status: DC
  Administered 2021-10-14 – 2021-10-18 (×5): 40 mg via ORAL
  Filled 2021-10-14 (×5): qty 1

## 2021-10-14 MED ORDER — NICOTINE 21 MG/24HR TD PT24
21.0000 mg | MEDICATED_PATCH | Freq: Every day | TRANSDERMAL | Status: DC
  Administered 2021-10-14 – 2021-10-18 (×5): 21 mg via TRANSDERMAL
  Filled 2021-10-14 (×5): qty 1

## 2021-10-14 MED ORDER — ROPIVACAINE HCL 5 MG/ML IJ SOLN
INTRAMUSCULAR | Status: DC | PRN
  Administered 2021-10-14: 30 mL via PERINEURAL

## 2021-10-14 MED ORDER — HYDROMORPHONE HCL 1 MG/ML IJ SOLN
0.5000 mg | INTRAMUSCULAR | Status: DC | PRN
  Administered 2021-10-14 – 2021-10-15 (×2): 1 mg via INTRAVENOUS
  Filled 2021-10-14 (×2): qty 1

## 2021-10-14 MED ORDER — OXYCODONE HCL 5 MG PO TABS
10.0000 mg | ORAL_TABLET | ORAL | Status: DC | PRN
  Administered 2021-10-14 – 2021-10-15 (×2): 10 mg via ORAL

## 2021-10-14 MED ORDER — ASCORBIC ACID 500 MG PO TABS
1000.0000 mg | ORAL_TABLET | Freq: Every day | ORAL | Status: DC
  Administered 2021-10-14 – 2021-10-18 (×5): 1000 mg via ORAL
  Filled 2021-10-14 (×5): qty 2

## 2021-10-14 SURGICAL SUPPLY — 30 items
BAG COUNTER SPONGE SURGICOUNT (BAG) ×4 IMPLANT
BAG SPNG CNTER NS LX DISP (BAG) ×2
BLADE SAW SGTL MED 73X18.5 STR (BLADE) ×1 IMPLANT
BLADE SURG 21 STRL SS (BLADE) ×4 IMPLANT
BNDG COHESIVE 4X5 TAN STRL (GAUZE/BANDAGES/DRESSINGS) ×3 IMPLANT
BNDG COHESIVE 6X5 TAN ST LF (GAUZE/BANDAGES/DRESSINGS) ×1 IMPLANT
BNDG GAUZE ELAST 4 BULKY (GAUZE/BANDAGES/DRESSINGS) ×3 IMPLANT
COVER SURGICAL LIGHT HANDLE (MISCELLANEOUS) ×8 IMPLANT
DRAPE U-SHAPE 47X51 STRL (DRAPES) ×8 IMPLANT
DRSG ADAPTIC 3X8 NADH LF (GAUZE/BANDAGES/DRESSINGS) ×3 IMPLANT
DRSG PAD ABDOMINAL 8X10 ST (GAUZE/BANDAGES/DRESSINGS) ×6 IMPLANT
DURAPREP 26ML APPLICATOR (WOUND CARE) ×4 IMPLANT
ELECT REM PT RETURN 9FT ADLT (ELECTROSURGICAL) ×3
ELECTRODE REM PT RTRN 9FT ADLT (ELECTROSURGICAL) ×3 IMPLANT
GAUZE SPONGE 4X4 12PLY STRL (GAUZE/BANDAGES/DRESSINGS) ×3 IMPLANT
GLOVE SURG ORTHO LTX SZ9 (GLOVE) ×4 IMPLANT
GLOVE SURG UNDER POLY LF SZ9 (GLOVE) ×4 IMPLANT
GOWN STRL REUS W/ TWL XL LVL3 (GOWN DISPOSABLE) ×6 IMPLANT
GOWN STRL REUS W/TWL XL LVL3 (GOWN DISPOSABLE) ×6
KIT BASIN OR (CUSTOM PROCEDURE TRAY) ×4 IMPLANT
KIT TURNOVER KIT B (KITS) ×4 IMPLANT
NS IRRIG 1000ML POUR BTL (IV SOLUTION) ×4 IMPLANT
PACK ORTHO EXTREMITY (CUSTOM PROCEDURE TRAY) ×4 IMPLANT
PAD ARMBOARD 7.5X6 YLW CONV (MISCELLANEOUS) ×8 IMPLANT
SPONGE T-LAP 18X18 ~~LOC~~+RFID (SPONGE) ×1 IMPLANT
STOCKINETTE IMPERVIOUS LG (DRAPES) IMPLANT
SUT ETHILON 2 0 PSLX (SUTURE) ×4 IMPLANT
TOWEL GREEN STERILE (TOWEL DISPOSABLE) ×4 IMPLANT
TUBE CONNECTING 12X1/4 (SUCTIONS) ×4 IMPLANT
YANKAUER SUCT BULB TIP NO VENT (SUCTIONS) ×4 IMPLANT

## 2021-10-14 NOTE — Assessment & Plan Note (Addendum)
As evidenced by significant muscle mass loss Nutrition Problem: Moderate Malnutrition Etiology: chronic illness (PAD) Signs/Symptoms: mild fat depletion, mild muscle depletion Interventions: Ensure Enlive (each supplement provides 350kcal and 20 grams of protein), Juven, MVI

## 2021-10-14 NOTE — Assessment & Plan Note (Addendum)
-  Laser arthrectomy, angioplasty and stent to left PA by Dr. Donzetta Matters on 1/24. -Laser arthrectomy, angioplasty of left ATA, SFA and DES to SFA by Dr. Unk Lightning on 2/15 -On Plavix, aspirin and statin -Encouraged tobacco cessation -Outpatient follow-up with vascular surgery

## 2021-10-14 NOTE — Assessment & Plan Note (Signed)
-   Plavix, aspirin and statin as above

## 2021-10-14 NOTE — Transfer of Care (Signed)
Immediate Anesthesia Transfer of Care Note  Patient: Seth [name]  Procedure(s) Performed: LEFT FOOT 1ST METATARSAL  AMPUTATION (Left) APPLICATION OF WOUND VAC  Patient Location: PACU  Anesthesia Type:MAC combined with regional for post-op pain  Level of Consciousness: awake, alert  and oriented  Airway & Oxygen Therapy: Patient Spontanous Breathing and Patient connected to nasal cannula oxygen  Post-op Assessment: Report given to RN, Post -op Vital signs reviewed and stable, Patient moving all extremities and Patient able to stick tongue midline  Post vital signs: Reviewed  Last Vitals:  Vitals Value Taken Time  BP 118/70 10/14/21 1517  Temp 97.4   Pulse 75 10/14/21 1517  Resp 24 10/14/21 1517  SpO2 100 % 10/14/21 1517  Vitals shown include unvalidated device data.  Last Pain:  Vitals:   10/14/21 0727  TempSrc: Oral  PainSc:          Complications: No notable events documented.

## 2021-10-14 NOTE — Assessment & Plan Note (Addendum)
-  Had left TMA by Dr. Sharol Given on 1/26 for osteomyelitis.  CRP 13.7.  ESR 80. -Underwent left TMA of first and second metatarsal on 2/17 with wound VAC placement. -Vanco/CTX and Flagyl discontinued 24 hours postop per Ortho recommendation. -Touchdown weightbearing on the left with postop shoe. -Continue wound VAC for 1 week until follow-up with orthopedic surgery -Outpatient follow-up with orthopedic surgery in 1 week -Tylenol as needed for pain

## 2021-10-14 NOTE — Assessment & Plan Note (Addendum)
Stable. -Inhalers/nebs as needed -Encouraged smoking cessation.

## 2021-10-14 NOTE — Anesthesia Preprocedure Evaluation (Addendum)
Anesthesia Evaluation  Patient identified by MRN, date of birth, ID band Patient awake    Reviewed: Allergy & Precautions, H&P , NPO status , Patient's Chart, lab work & pertinent test results  Airway Mallampati: II  TM Distance: >3 FB Neck ROM: Full    Dental  (+) Edentulous Upper, Edentulous Lower   Pulmonary COPD, Current Smoker,    Pulmonary exam normal breath sounds clear to auscultation       Cardiovascular hypertension, Pt. on home beta blockers and Pt. on medications + Peripheral Vascular Disease and + DOE  Normal cardiovascular exam Rhythm:Regular Rate:Normal  Echo 02/2021 1. Left ventricular ejection fraction, by estimation, is 70 to 75%. The left ventricle has hyperdynamic function.  2. Right ventricular systolic function is normal. The right ventricular size is normal. There is normal pulmonary artery systolic pressure. The estimated right ventricular systolic pressure is 26.9 mmHg.  3. The mitral valve is grossly normal. No evidence of mitral valve regurgitation.  4. The aortic valve is grossly normal. Aortic valve regurgitation is not visualized. No aortic stenosis is present. Aortic valve mean gradient measures 8.0 mmHg. Aortic valve Vmax measures 1.94 m/s.    Neuro/Psych Seizures -,  PSYCHIATRIC DISORDERS Depression CVA    GI/Hepatic   Endo/Other    Renal/GU Renal disease     Musculoskeletal   Abdominal   Peds  Hematology  (+) Blood dyscrasia, anemia ,   Anesthesia Other Findings   Reproductive/Obstetrics                           Anesthesia Physical  Anesthesia Plan  ASA: 3  Anesthesia Plan: Regional   Post-op Pain Management: Regional block*   Induction: Intravenous  PONV Risk Score and Plan: 1 and Propofol infusion, Treatment may vary due to age or medical condition and Dexamethasone  Airway Management Planned: Simple Face Mask  Additional Equipment:    Intra-op Plan:   Post-operative Plan:   Informed Consent: I have reviewed the patients History and Physical, chart, labs and discussed the procedure including the risks, benefits and alternatives for the proposed anesthesia with the patient or authorized representative who has indicated his/her understanding and acceptance.     Dental advisory given  Plan Discussed with: CRNA  Anesthesia Plan Comments:       Anesthesia Quick Evaluation

## 2021-10-14 NOTE — Anesthesia Procedure Notes (Signed)
Anesthesia Regional Block: Popliteal block   Pre-Anesthetic Checklist: , timeout performed,  Correct Patient, Correct Site, Correct Laterality,  Correct Procedure, Correct Position, site marked,  Risks and benefits discussed,  Surgical consent,  Pre-op evaluation,  At surgeon's request and post-op pain management  Laterality: Lower and Left  Prep: chloraprep       Needles:  Injection technique: Single-shot  Needle Type: Stimiplex     Needle Length: 10cm  Needle Gauge: 21     Additional Needles:   Procedures:,,,, ultrasound used (permanent image in chart),,   Motor weakness within 5 minutes.  Narrative:  Start time: 10/14/2021 1:25 PM End time: 10/14/2021 1:45 PM Injection made incrementally with aspirations every 5 mL.  Performed by: Personally  Anesthesiologist: Nolon Nations, MD  Additional Notes: Nerve located and needle positioned with direct ultrasound guidance. Good perineural spread. Patient tolerated well.

## 2021-10-14 NOTE — Progress Notes (Signed)
Pt back from OR s/p TMA with previna wound vac placement.  Pt assessment complete, VS WNL, pt without complaints of pain or otherwise.  Will continue to monitor.

## 2021-10-14 NOTE — Progress Notes (Signed)
PROGRESS NOTE  Seth [name] OJJ:[id number] DOB: [birth year]   PCP: Lucianne Lei, MD  Patient is from: SNF.  DOA: 10/10/2021 LOS: 4  Chief complaints:  Chief Complaint  Patient presents with   foot/wound infection     Brief Narrative / Interim history: 66 year old M with PMH of CVA with residual right hemiparesis, SDH, seizure, COPD, tobacco use disorder, moderate malnutrition HTN, BPH, depression, gout, PAD s/p laser arthrectomy and stent placement to left popliteal artery on 1/24 by Dr. 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  In ED, HDS, afebrile without leukocytosis.  His wound showed exposed bone.  X-ray showed bone destruction at the stump of the first metatarsal concerning for osteomyelitis.  Orthopedic surgery, Dr. Sharol Given consulted.  He was a started on broad-spectrum antibiotics.  Plan for left TMA revision/amputation on 2/17.   Subjective: Seen and examined earlier this morning.  No major events overnight of this morning.  No complaints.  Pain fairly controlled.  Understands plan for surgery today.  He reports smoking about a pack a day since the age of 50.  Extensive discussion about smoking cessation especially with his COPD, PAD and amputation.  Objective: Vitals:   10/14/21 1355 10/14/21 1400 10/14/21 1405 10/14/21 1410  BP: 120/70 111/72 117/76 120/71  Pulse: 73 77 82 77  Resp:      Temp:      TempSrc:      SpO2: 100% 98% 98% 98%  Weight:      Height:        Examination:  GENERAL: No apparent distress.  Nontoxic. HEENT: MMM.  Vision and hearing grossly intact.  NECK: Supple.  No apparent JVD.  RESP: 98% on RA.  No IWOB.  Fair aeration bilaterally. CVS:  RRR. Heart sounds normal.  ABD/GI/GU: BS+. Abd soft, NTND.  MSK/EXT:  Moves extremities.  Significant muscle mass and subcu fat loss. S/p left TMA.  SKIN: no apparent skin  lesion or wound NEURO: Awake, alert and oriented appropriately.  No apparent focal neuro deficit. PSYCH: Calm. Normal affect.   Procedures:  None  Microbiology summarized: [id number] and influenza PCR nonreactive. Urine culture with 10,000 colonies of diphtheroids-likely contaminant Blood cultures NGTD.  Assessment and Plan: * Acute osteomyelitis of left first metatarsal stump- (present on admission) -Had left TMA by Dr. Sharol Given on 1/26 for osteomyelitis. -Plan for revision/left TMA on 2/17. -Continue Vanco/CTX and Flagyl -Check CRP and ESR in the morning  Peripheral arterial disease ([place]) -Revascularization with laser arthrectomy, angioplasty and stent to left PA by Dr. Donzetta Matters on 1/24. -Revascularization with laser arthrectomy, angioplasty of left ATA, SFA and DES to SFA by Dr. Unk Lightning on 2/15 -On Plavix, aspirin and statin -Encourage tobacco cessation  Seizures ([place]) Last seen by neurology on 09/01/2021. - Continue Keppra, Vimpat and phenobarbital   Benign essential HTN- (present on admission) - Continue home Toprol  History of CVA (cerebrovascular accident) - Plavix, aspirin and statin as above  Generalized weakness - PT/OT eval after surgery  Malnutrition of moderate degree- (present on admission) As evidenced by significant muscle mass loss Nutrition Problem: Moderate Malnutrition Etiology: chronic illness (PAD) Signs/Symptoms: mild fat depletion, mild muscle depletion Interventions: Ensure Enlive (each supplement provides 350kcal and 20 grams of protein), Juven, MVI  COPD (chronic obstructive pulmonary disease) ([place])- (present on admission) Stable. -Inhalers/nebs as needed -Encourage smoking cessation.  Tobacco use disorder Reports smoking  about a pack a day since the age of 64. -Discussed the importance of smoking cessation especially in regards to wound healing and PAD -Nicotine patch    DVT prophylaxis:  heparin injection 5,000 Units Start: 10/12/21  2200 SCDs Start: 10/10/21 2021  Code Status: Full code Family Communication: Patient and/or RN. Available if any question.  Level of care: Med-Surg Status is: Inpatient Remains inpatient appropriate because: Left foot osteomyelitis requiring IV antibiotics and surgical intervention.       Final disposition: Likely back to SNF when medically stable.      Consultants:  Orthopedic surgery   Sch Meds:  Scheduled Meds:  [MAR Hold] aspirin EC  81 mg Oral Daily   [MAR Hold] clopidogrel  75 mg Oral Q breakfast   [MAR Hold] feeding supplement  237 mL Oral BID BM   fentaNYL       [MAR Hold] heparin  5,000 Units Subcutaneous Q8H   [MAR Hold] lacosamide  100 mg Oral BID   [MAR Hold] levETIRAcetam  1,500 mg Oral BID   [MAR Hold] multivitamin with minerals  1 tablet Oral Daily   [MAR Hold] nicotine  21 mg Transdermal Daily   [MAR Hold] nutrition supplement (JUVEN)  1 packet Oral BID BM   [MAR Hold] phenobarbital  32.4 mg Oral Daily   [MAR Hold] PHENobarbital  64.8 mg Oral QHS   povidone-iodine  2 application Topical Once   [MAR Hold] rosuvastatin  20 mg Oral Daily   [MAR Hold] sodium chloride flush  3 mL Intravenous Q12H   Continuous Infusions:  sodium chloride 100 mL/hr at 10/12/21 1043   [MAR Hold] sodium chloride     [MAR Hold] cefTRIAXone (ROCEPHIN)  IV Stopped (10/13/21 2230)   lactated ringers 10 mL/hr at 10/14/21 1318   [MAR Hold] metronidazole 500 mg (10/14/21 0010)   [MAR Hold] vancomycin 1,250 mg (10/14/21 1015)   PRN Meds:.[MAR Hold] sodium chloride, 0.9 % irrigation (POUR BTL), [MAR Hold] acetaminophen, [MAR Hold] hydrALAZINE, [MAR Hold] labetalol, [MAR Hold] ondansetron (ZOFRAN) IV, [MAR Hold] sodium chloride flush  Antimicrobials: Anti-infectives (From admission, onward)    Start     Dose/Rate Route Frequency Ordered Stop   10/13/21 1330  ceFAZolin (ANCEF) IVPB 2g/100 mL premix        2 g 200 mL/hr over 30 Minutes Intravenous On call to O.R. 10/13/21 1318  10/14/21 0559   10/13/21 1000  [MAR Hold]  vancomycin (VANCOREADY) IVPB 1250 mg/250 mL        (MAR Hold since Fri 10/14/2021 at 1303.Hold Reason: Transfer to a Procedural area)   1,250 mg 166.7 mL/hr over 90 Minutes Intravenous Every 12 hours 10/13/21 0839     10/11/21 0000  [MAR Hold]  cefTRIAXone (ROCEPHIN) 2 g in sodium chloride 0.9 % 100 mL IVPB        (MAR Hold since Fri 10/14/2021 at 1302.Hold Reason: Transfer to a Procedural area)   2 g 200 mL/hr over 30 Minutes Intravenous Every 24 hours 10/10/21 1958     10/11/21 0000  [MAR Hold]  metroNIDAZOLE (FLAGYL) IVPB 500 mg        (MAR Hold since Fri 10/14/2021 at 1302.Hold Reason: Transfer to a Procedural area)   500 mg 100 mL/hr over 60 Minutes Intravenous Every 12 hours 10/10/21 1958     10/10/21 1830  vancomycin (VANCOREADY) IVPB 1750 mg/350 mL  Status:  Discontinued        1,750 mg 175 mL/hr over 120 Minutes Intravenous Every 24 hours 10/10/21  1755 10/13/21 0839   10/10/21 1745  piperacillin-tazobactam (ZOSYN) IVPB 3.375 g        3.375 g 100 mL/hr over 30 Minutes Intravenous  Once 10/10/21 1739 10/10/21 1843        I have personally reviewed the following labs and images: CBC: Recent Labs  Lab 10/10/21 1545 10/11/21 0101 10/12/21 0402 10/13/21 0058 10/14/21 0613  WBC 8.6 7.3 6.9 7.2 6.8  NEUTROABS 5.5  --  4.2 4.8 4.0  HGB 11.4* 9.4* 10.0* 10.3* 9.7*  HCT 37.1* 30.4* 31.5* 31.8* 31.1*  MCV 82.3 80.4 78.4* 78.7* 79.3*  PLT 581* 513* 520* 497* 467*   BMP &GFR Recent Labs  Lab 10/10/21 1545 10/12/21 0402 10/13/21 0058 10/14/21 0613  NA 137 135 133* 136  K 4.9 4.3 4.3 4.1  CL 103 105 103 105  CO2 23 20* 19* 19*  GLUCOSE 87 84 82 84  BUN '11 10 10 12  ' CREATININE 1.19 1.04 0.92 0.95  CALCIUM 9.6 9.0 8.9 9.2   Estimated Creatinine Clearance: 76.6 mL/min (by C-G formula based on SCr of 0.95 mg/dL). Liver & Pancreas: Recent Labs  Lab 10/10/21 1545  AST 20  ALT 16  ALKPHOS 108  BILITOT 0.1*  PROT 7.6  ALBUMIN  3.3*   No results for input(s): LIPASE, AMYLASE in the last 168 hours. No results for input(s): AMMONIA in the last 168 hours. Diabetic: No results for input(s): HGBA1C in the last 72 hours. No results for input(s): GLUCAP in the last 168 hours. Cardiac Enzymes: No results for input(s): CKTOTAL, CKMB, CKMBINDEX, TROPONINI in the last 168 hours. No results for input(s): PROBNP in the last 8760 hours. Coagulation Profile: Recent Labs  Lab 10/10/21 1545  INR 1.1   Thyroid Function Tests: No results for input(s): TSH, T4TOTAL, FREET4, T3FREE, THYROIDAB in the last 72 hours. Lipid Profile: Recent Labs    10/13/21 0058  CHOL 115  HDL 42  LDLCALC 63  TRIG 50  CHOLHDL 2.7   Anemia Panel: No results for input(s): VITAMINB12, FOLATE, FERRITIN, TIBC, IRON, RETICCTPCT in the last 72 hours. Urine analysis:    Component Value Date/Time   COLORURINE YELLOW 10/10/2021 2201   APPEARANCEUR CLEAR 10/10/2021 2201   LABSPEC 1.018 10/10/2021 2201   PHURINE 5.0 10/10/2021 2201   GLUCOSEU NEGATIVE 10/10/2021 2201   HGBUR NEGATIVE 10/10/2021 2201   BILIRUBINUR NEGATIVE 10/10/2021 2201   KETONESUR NEGATIVE 10/10/2021 2201   PROTEINUR NEGATIVE 10/10/2021 2201   NITRITE NEGATIVE 10/10/2021 2201   LEUKOCYTESUR NEGATIVE 10/10/2021 2201   Sepsis Labs: Invalid input(s): PROCALCITONIN, [place]  Microbiology: Recent Results (from the past 240 hour(s))  Urine Culture     Status: Abnormal   Collection Time: 10/10/21  3:36 PM   Specimen: In/Out Cath Urine  Result Value Ref Range Status   Specimen Description IN/OUT CATH URINE  Final   Special Requests NONE  Final   Culture (A)  Final    10,000 COLONIES/mL DIPHTHEROIDS(CORYNEBACTERIUM SPECIES) Standardized susceptibility testing for this organism is not available. Performed at [facility], [place] [address], [place], [place] [address]    Report Status 10/12/2021 FINAL  Final  Blood Culture (routine x 2)     Status: None (Preliminary  result)   Collection Time: 10/10/21  3:41 PM   Specimen: BLOOD  Result Value Ref Range Status   Specimen Description BLOOD BLOOD RIGHT HAND  Final   Special Requests   Final    BOTTLES DRAWN AEROBIC AND ANAEROBIC Blood Culture results  may not be optimal due to an inadequate volume of blood received in culture bottles   Culture   Final    NO GROWTH 4 DAYS Performed at [facility], [place] [address] [place], [place] [address]    Report Status PENDING  Incomplete  Blood Culture (routine x 2)     Status: None (Preliminary result)   Collection Time: 10/10/21  3:45 PM   Specimen: BLOOD  Result Value Ref Range Status   Specimen Description BLOOD BLOOD RIGHT HAND  Final   Special Requests   Final    BOTTLES DRAWN AEROBIC AND ANAEROBIC Blood Culture adequate volume   Culture   Final    NO GROWTH 4 DAYS Performed at [facility], [place] [address], [place], [place] [address]    Report Status PENDING  Incomplete  Resp Panel by RT-PCR (Flu A&B, Covid) Nasopharyngeal Swab     Status: None   Collection Time: 10/10/21  6:37 PM   Specimen: Nasopharyngeal Swab; Nasopharyngeal(NP) swabs in vial transport medium  Result Value Ref Range Status   SARS Coronavirus 2 by RT PCR NEGATIVE NEGATIVE Final    Comment: (NOTE) SARS-CoV-2 target nucleic acids are NOT DETECTED.  The SARS-CoV-2 RNA is generally detectable in upper respiratory specimens during the acute phase of infection. The lowest concentration of SARS-CoV-2 viral copies this assay can detect is 138 copies/mL. A negative result does not preclude SARS-Cov-2 infection and should not be used as the sole basis for treatment or other patient management decisions. A negative result may occur with  improper specimen collection/handling, submission of specimen other than nasopharyngeal swab, presence of viral mutation(s) within the areas targeted by this assay, and inadequate number of viral copies(<138 copies/mL). A negative result must be  combined with clinical observations, patient history, and epidemiological information. The expected result is Negative.  Fact Sheet for Patients:  EntrepreneurPulse.com.au  Fact Sheet for Healthcare Providers:  IncredibleEmployment.be  This test is no t yet approved or cleared by the [place] FDA and  has been authorized for detection and/or diagnosis of SARS-CoV-2 by FDA under an Emergency Use Authorization (EUA). This EUA will remain  in effect (meaning this test can be used) for the duration of the COVID-19 declaration under Section 564(b)(1) of the Act, 21 U.S.C.section 360bbb-3(b)(1), unless the authorization is terminated  or revoked sooner.       Influenza A by PCR NEGATIVE NEGATIVE Final   Influenza B by PCR NEGATIVE NEGATIVE Final    Comment: (NOTE) The Xpert Xpress SARS-CoV-2/FLU/RSV plus assay is intended as an aid in the diagnosis of influenza from Nasopharyngeal swab specimens and should not be used as a sole basis for treatment. Nasal washings and aspirates are unacceptable for Xpert Xpress SARS-CoV-2/FLU/RSV testing.  Fact Sheet for Patients: EntrepreneurPulse.com.au  Fact Sheet for Healthcare Providers: IncredibleEmployment.be  This test is not yet approved or cleared by the [place] FDA and has been authorized for detection and/or diagnosis of SARS-CoV-2 by FDA under an Emergency Use Authorization (EUA). This EUA will remain in effect (meaning this test can be used) for the duration of the COVID-19 declaration under Section 564(b)(1) of the Act, 21 U.S.C. section 360bbb-3(b)(1), unless the authorization is terminated or revoked.  Performed at [facility], [place] [address], [place], [place] [address]   Surgical pcr screen     Status: None   Collection Time: 10/13/21  2:45 PM   Specimen: Nasal Mucosa; Nasal Swab  Result Value Ref Range  Status   MRSA, PCR NEGATIVE  NEGATIVE Final   Staphylococcus aureus NEGATIVE NEGATIVE Final    Comment: (NOTE) The Xpert SA Assay (FDA approved for NASAL specimens in patients 63 years of age and older), is one component of a comprehensive surveillance program. It is not intended to diagnose infection nor to guide or monitor treatment. Performed at [facility], [place] [address] [place], [place] [address]     Radiology Studies: No results found.    Keylan Costabile T. [place]  If 7PM-7AM, please contact night-coverage [email or web address] 10/14/2021, 3:00 PM

## 2021-10-14 NOTE — Assessment & Plan Note (Signed)
-   Continue home Toprol

## 2021-10-14 NOTE — Op Note (Signed)
10/14/2021  3:29 PM  PATIENT:  Seth [name]    PRE-OPERATIVE DIAGNOSIS:  Osteomyelitis Left Foot  POST-OPERATIVE DIAGNOSIS:  Same  PROCEDURE:  LEFT FOOT 1ST METATARSAL AND SECOND METATARSAL AMPUTATION, APPLICATION OF WOUND VAC  SURGEON:  Newt Minion, MD  PHYSICIAN ASSISTANT:None ANESTHESIA:   General  PREOPERATIVE INDICATIONS:  Seth [name] is a  66 y.o. male with a diagnosis of Osteomyelitis Left Foot who failed conservative measures and elected for surgical management.    The risks benefits and alternatives were discussed with the patient preoperatively including but not limited to the risks of infection, bleeding, nerve injury, cardiopulmonary complications, the need for revision surgery, among others, and the patient was willing to proceed.  OPERATIVE IMPLANTS: Praveena 13 cm VAC  @ENCIMAGES @  OPERATIVE FINDINGS: Good petechial bleeding  OPERATIVE PROCEDURE: Patient brought the operating room and underwent a regional anesthetic.  After adequate levels anesthesia were obtained patient's left lower extremity was prepped using DuraPrep draped into a sterile field a timeout was called.  Elliptical incision was made around the necrotic ulcer over the first ray.  There is also necrotic bone of the second metatarsal as well.  Using oscillating saw both the second and first metatarsal resected back to healthy bleeding bone.  The wound was irrigated with normal saline all the tissue margins had good petechial bleeding.  The incision was closed using 2-0 nylon a Prevena wound VAC was applied this had a good suction fit.  Patient was taken the PACU in stable condition   DISCHARGE PLANNING:  Antibiotic duration: Continue antibiotics for 24 hours  Weightbearing: Touchdown weightbearing on the left  Pain medication: Opioid pathway  Dressing care/ Wound VAC: Continue wound VAC for 1 week  Ambulatory devices: [place]  Discharge to: Home.  Follow-up: In the office 1 week post  operative.

## 2021-10-14 NOTE — Anesthesia Postprocedure Evaluation (Signed)
Anesthesia Post Note  Patient: Seth [name]  Procedure(s) Performed: LEFT FOOT 1ST METATARSAL  AMPUTATION (Left) APPLICATION OF WOUND VAC     Patient location during evaluation: PACU Anesthesia Type: Regional Level of consciousness: awake and alert Pain management: pain level controlled Vital Signs Assessment: post-procedure vital signs reviewed and stable Respiratory status: spontaneous breathing Cardiovascular status: stable Anesthetic complications: no   No notable events documented.  Last Vitals:  Vitals:   10/14/21 1535 10/14/21 1549  BP: 120/75 132/74  Pulse: 78 69  Resp: 18 17  Temp: 36.8 C 36.8 C  SpO2: 100% 97%    Last Pain:  Vitals:   10/14/21 1549  TempSrc: Oral  PainSc:                  Nolon Nations

## 2021-10-14 NOTE — Progress Notes (Signed)
Orthopedic Tech Progress Note Patient Details:  Seth [name] 11-Nov-1955 [id number]  Ortho Devices Type of Ortho Device: Postop shoe/boot Ortho Device/Splint Location: lle Ortho Device/Splint Interventions: Ordered    Dropped post op shoe in patient room.  Odaliz Mcqueary L Abril Cappiello 10/14/2021, 6:00 PM

## 2021-10-14 NOTE — Assessment & Plan Note (Signed)
Reports smoking about a pack a day since the age of 76. -Discussed the importance of smoking cessation especially in regards to wound healing and PAD -Nicotine patch

## 2021-10-14 NOTE — Interval H&P Note (Signed)
History and Physical Interval Note:  10/14/2021 9:37 AM  Seth [name]  has presented today for surgery, with the diagnosis of Osteomyelitis Left Foot.  The various methods of treatment have been discussed with the patient and family. After consideration of risks, benefits and other options for treatment, the patient has consented to  Procedure(s): LEFT FOOT 1ST RAY AMPUTATION (Left) as a surgical intervention.  The patient's history has been reviewed, patient examined, no change in status, stable for surgery.  I have reviewed the patient's chart and labs.  Questions were answered to the patient's satisfaction.     Newt Minion

## 2021-10-14 NOTE — Hospital Course (Addendum)
66 year old M with PMH of CVA with residual right hemiparesis, SDH, seizure, COPD, tobacco use disorder, moderate malnutrition HTN, BPH, depression, gout, PAD s/p laser arthrectomy and stent placement to left popliteal artery on 1/24 by Dr. 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  In ED, HDS, afebrile without leukocytosis.  His wound showed exposed bone.  X-ray showed bone destruction at the stump of the first metatarsal concerning for osteomyelitis.  Orthopedic surgery, Dr. Sharol Given consulted.  He was a started on broad-spectrum antibiotics.  Underwent TMA of first and second metatarsal with wound VAC placement on 2/17.  Broad-spectrum antibiotics discontinued on 2/18.  Orthopedic surgery recommended touchdown weightbearing on left foot, wound VAC for 1 week until outpatient follow-up.  Patient is optimized for discharge to SNF once bed available.

## 2021-10-14 NOTE — Discharge Instructions (Signed)
° °  [facility] ° °Discharge Instructions ° °Lower Extremity Angiogram; Angioplasty/Stenting ° °Please refer to the following instructions for your post-procedure care. Your surgeon or physician assistant will discuss any changes with you. ° °Activity ° °Avoid lifting more than 8 pounds (1 gallons of milk) for 72 hours (3 days) after your procedure. You may walk as much as you can tolerate. It's OK to drive after 72 hours. ° °Bathing/Showering ° °You may shower the day after your procedure. If you have a bandage, you may remove it at 24- 48 hours. Clean your incision site with mild soap and water. Pat the area dry with a clean towel. ° °Diet ° °Resume your pre-procedure diet. There are no special food restrictions following this procedure. All patients with peripheral vascular disease should follow a low fat/low cholesterol diet. In order to heal from your surgery, it is CRITICAL to get adequate nutrition. Your body requires vitamins, minerals, and protein. Vegetables are the best source of vitamins and minerals. Vegetables also provide the perfect balance of protein. Processed food has little nutritional value, so try to avoid this. ° °Medications ° °Resume taking all of your medications unless your doctor tells you not to. If your incision is causing pain, you may take over-the-counter pain relievers such as acetaminophen (Tylenol) ° °Follow Up ° °Follow up will be arranged at the time of your procedure. You may have an office visit scheduled or may be scheduled for surgery. Ask your surgeon if you have any questions. ° °Please call us immediately for any of the following conditions: °•Severe or worsening pain your legs or feet at rest or with walking. °•Increased pain, redness, drainage at your groin puncture site. °•Fever of 101 degrees or higher. °•If you have any mild or slow bleeding from your puncture site: lie down, apply firm constant pressure over the area with a piece of  gauze or a clean wash cloth for 30 minutes- no peeking!, call 911 right away if you are still bleeding after 30 minutes, or if the bleeding is heavy and unmanageable. ° °Reduce your risk factors of vascular disease: ° °Stop smoking. If you would like help call [facility] at 1-800-QUIT-NOW (1-800-784-8669) or [place] at 336-586-4000. °Manage your cholesterol °Maintain a desired weight °Control your diabetes °Keep your blood pressure down ° °If you have any questions, please call the office at [phone] ° °

## 2021-10-14 NOTE — Assessment & Plan Note (Addendum)
Last seen by neurology on 09/01/2021. - Continue Keppra, Vimpat and phenobarbital -Discontinue tramadol.  Lowers seizure threshold.

## 2021-10-14 NOTE — Assessment & Plan Note (Addendum)
Continue PT/OT

## 2021-10-15 ENCOUNTER — Encounter (HOSPITAL_COMMUNITY): Payer: Self-pay | Admitting: Orthopedic Surgery

## 2021-10-15 DIAGNOSIS — E872 Acidosis, unspecified: Secondary | ICD-10-CM

## 2021-10-15 DIAGNOSIS — E871 Hypo-osmolality and hyponatremia: Secondary | ICD-10-CM

## 2021-10-15 LAB — C-REACTIVE PROTEIN: CRP: 13.7 mg/dL — ABNORMAL HIGH (ref <1.0)

## 2021-10-15 LAB — CULTURE, BLOOD (ROUTINE X 2)
Culture: NO GROWTH
Culture: NO GROWTH
Special Requests: ADEQUATE

## 2021-10-15 LAB — RENAL FUNCTION PANEL
Albumin: 2.8 g/dL — ABNORMAL LOW (ref 3.5–5.0)
Anion gap: 12 (ref 5–15)
BUN: 11 mg/dL (ref 8–23)
CO2: 17 mmol/L — ABNORMAL LOW (ref 22–32)
Calcium: 9 mg/dL (ref 8.9–10.3)
Chloride: 105 mmol/L (ref 98–111)
Creatinine, Ser: 1.01 mg/dL (ref 0.61–1.24)
GFR, Estimated: >60 mL/min (ref >60)
Glucose, Bld: 98 mg/dL (ref 70–99)
Phosphorus: 3.6 mg/dL (ref 2.5–4.6)
Potassium: 4.1 mmol/L (ref 3.5–5.1)
Sodium: 134 mmol/L — ABNORMAL LOW (ref 135–145)

## 2021-10-15 LAB — MAGNESIUM: Magnesium: 1.7 mg/dL (ref 1.7–2.4)

## 2021-10-15 LAB — SEDIMENTATION RATE: Sed Rate: 80 mm/hr — ABNORMAL HIGH (ref 0–16)

## 2021-10-15 LAB — GLUCOSE, CAPILLARY: Glucose-Capillary: 100 mg/dL — ABNORMAL HIGH (ref 70–99)

## 2021-10-15 MED ORDER — THIAMINE HCL 100 MG PO TABS
100.0000 mg | ORAL_TABLET | Freq: Every day | ORAL | Status: DC
  Administered 2021-10-15 – 2021-10-18 (×4): 100 mg via ORAL
  Filled 2021-10-15 (×4): qty 1

## 2021-10-15 MED ORDER — SODIUM BICARBONATE 650 MG PO TABS
650.0000 mg | ORAL_TABLET | Freq: Three times a day (TID) | ORAL | Status: DC
  Administered 2021-10-15 – 2021-10-18 (×10): 650 mg via ORAL
  Filled 2021-10-15 (×10): qty 1

## 2021-10-15 MED ORDER — LEVETIRACETAM 100 MG/ML PO SOLN
1500.0000 mg | Freq: Two times a day (BID) | ORAL | Status: DC
  Administered 2021-10-15 – 2021-10-18 (×6): 1500 mg via ORAL
  Filled 2021-10-15 (×7): qty 15

## 2021-10-15 MED ORDER — SERTRALINE HCL 25 MG PO TABS
25.0000 mg | ORAL_TABLET | Freq: Every day | ORAL | Status: DC
Start: 2021-10-15 — End: 2021-10-18
  Administered 2021-10-15 – 2021-10-18 (×4): 25 mg via ORAL
  Filled 2021-10-15 (×4): qty 1

## 2021-10-15 MED ORDER — GABAPENTIN 100 MG PO CAPS
100.0000 mg | ORAL_CAPSULE | Freq: Three times a day (TID) | ORAL | Status: DC
  Administered 2021-10-15 – 2021-10-18 (×9): 100 mg via ORAL
  Filled 2021-10-15 (×9): qty 1

## 2021-10-15 MED ORDER — METOPROLOL TARTRATE 50 MG PO TABS
50.0000 mg | ORAL_TABLET | Freq: Two times a day (BID) | ORAL | Status: DC
  Administered 2021-10-15 – 2021-10-18 (×7): 50 mg via ORAL
  Filled 2021-10-15 (×7): qty 1

## 2021-10-15 MED ORDER — FOLIC ACID 1 MG PO TABS
1.0000 mg | ORAL_TABLET | Freq: Every day | ORAL | Status: DC
Start: 2021-10-15 — End: 2021-10-18
  Administered 2021-10-15 – 2021-10-18 (×4): 1 mg via ORAL
  Filled 2021-10-15 (×4): qty 1

## 2021-10-15 NOTE — Assessment & Plan Note (Addendum)
Resolved

## 2021-10-15 NOTE — Progress Notes (Signed)
Inpatient Rehab Admissions Coordinator:   CIR consult received. Pt.'s payor will not approve CIR for his diagnosis. I will not pursue admission. CIR to sign off, recommend TOC pursue other rehab venues.   Clemens Catholic, [place], [place] Admissions Coordinator  [phone] ([place]) [phone] (office)

## 2021-10-15 NOTE — Evaluation (Signed)
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  In ED, afebrile without leukocytosis.  His wound showed exposed bone.  X-ray showed bone destruction at the stump of the first metatarsal concerning for osteomyelitis. Dr. Sharol Given consulted.  He was a started on broad-spectrum antibiotics.     Underwent TMA of first and second metatarsal with wound VAC placement on 2/17.  PMH of CVA with residual right hemiparesis, SDH, seizure, COPD, tobacco use disorder, moderate malnutrition HTN, BPH, depression, gout, PAD s/p laser arthrectomy and stent placement to left popliteal artery on 1/24 by Dr. 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 Sharol Given  Clinical Impression  Pt admitted with above diagnosis. Pt was able to transfer to 3N1 with RW with difficulty maintaining TDWB left LE.  Pt with recent CVA and was at SNF recovering PTA.  REcommend return to SNF to continue therapy until left foot heals and pt can be Modif I.  Will follow acutely.  Pt currently with functional limitations due to the deficits listed below (see PT Problem List). Pt will benefit from skilled PT to increase their independence and safety with mobility to allow discharge to the venue listed below.          Recommendations for follow up therapy are one component of a multi-disciplinary discharge planning process, led by the attending physician.  Recommendations may be updated based on patient status, additional functional criteria and insurance authorization.  Follow Up Recommendations Skilled nursing-short term rehab (<3 hours/day)    Assistance Recommended at Discharge Intermittent Supervision/Assistance  Patient can return home with the following  A little help with walking and/or transfers;A little  help with bathing/dressing/bathroom;Help with stairs or ramp for entrance    Equipment Recommendations Other (comment) (TBA)  Recommendations for Other Services       Functional Status Assessment Patient has had a recent decline in their functional status and demonstrates the ability to make significant improvements in function in a reasonable and predictable amount of time.     Precautions / Restrictions Precautions Precautions: Fall Precaution Comments: VAC left foot Required Braces or Orthoses: Other Brace Other Brace: post op shoe left foot Restrictions Weight Bearing Restrictions: Yes LLE Weight Bearing: Touchdown weight bearing      Mobility  Bed Mobility Overal bed mobility: Independent                  Transfers Overall transfer level: Needs assistance Equipment used: Rolling Stebbins (2 wheels) Transfers: Sit to/from Stand, Bed to chair/wheelchair/BSC Sit to Stand: Min guard, Min assist   Step pivot transfers: Min guard, Min assist       General transfer comment: Pt asked to get onto 3N1 to have BM.  Pt was able to pivot to 3N1 with min assist and cues.  He needed cues for TDWB on left LE as this was difficult for pt to keep weight off of the foot.  Post op shoe was in place. Pt needs continued practice and repetition. Got pt from 3N1 to bed after he used bathroom due to fatigue and inability to maintain TDWB left LE.    Ambulation/Gait                  Stairs  Wheelchair Mobility    Modified Rankin (Stroke Patients Only)       Balance Overall balance assessment: Needs assistance Sitting-balance support: No upper extremity supported, Feet supported Sitting balance-[name] Scale: Fair     Standing balance support: Bilateral upper extremity supported, During functional activity Standing balance-[name] Scale: Poor Standing balance comment: relies on UE support for balance                             Pertinent  Vitals/Pain Pain Assessment Pain Assessment: No/denies pain    Home Living Family/patient expects to be discharged to:: Skilled nursing facility                   Additional Comments: Had been recovering in a SNF for last 2 months after CVA.  Plans to return to continue therapy    Prior Function Prior Level of Function : Needs assist             Mobility Comments: used wheelchair around facility, Modif I with transfers and propelled self ADLs Comments: Bathed self at sink.  Assist with dressing     Hand Dominance   Dominant Hand: Right    Extremity/Trunk Assessment   Upper Extremity Assessment Upper Extremity Assessment: Defer to OT evaluation    Lower Extremity Assessment Lower Extremity Assessment: LLE deficits/detail LLE Deficits / Details: ROM WFL LLE: Unable to fully assess due to pain    Cervical / Trunk Assessment Cervical / Trunk Assessment: Normal  Communication   Communication: No difficulties  Cognition Arousal/Alertness: Awake/alert Behavior During Therapy: WFL for tasks assessed/performed Overall Cognitive Status: Within Functional Limits for tasks assessed                                          General Comments General comments (skin integrity, edema, etc.): VSS    Exercises     Assessment/Plan    PT Assessment Patient needs continued PT services  PT Problem List Decreased mobility;Decreased balance;Decreased activity tolerance;Decreased knowledge of use of DME;Decreased strength;Decreased range of motion;Decreased safety awareness;Decreased knowledge of precautions       PT Treatment Interventions DME instruction;Gait training;Functional mobility training;Therapeutic activities;Balance training;Therapeutic exercise;Patient/family education    PT Goals (Current goals can be found in the Care Plan section)  Acute Rehab PT Goals Patient Stated Goal: to continue therapy at SNF PT Goal Formulation: With patient Time  For Goal Achievement: 10/29/21 Potential to Achieve Goals: Good    Frequency Min 3X/week     Co-evaluation               AM-PAC PT "6 Clicks" Mobility  Outcome Measure Help needed turning from your back to your side while in a flat bed without using bedrails?: A Little Help needed moving from lying on your back to sitting on the side of a flat bed without using bedrails?: A Little Help needed moving to and from a bed to a chair (including a wheelchair)?: A Little Help needed standing up from a chair using your arms (e.g., wheelchair or bedside chair)?: A Little Help needed to walk in hospital room?: A Lot Help needed climbing 3-5 steps with a railing? : Total 6 Click Score: 15    End of Session Equipment Utilized During Treatment: Gait belt Activity Tolerance: Patient limited by fatigue Patient left: with call bell/phone within reach;in bed;with  bed alarm set Nurse Communication: Mobility status PT Visit Diagnosis: Unsteadiness on feet (R26.81);Muscle weakness (generalized) (M62.81)    Time: 6816-6196 PT Time Calculation (min) (ACUTE ONLY): 23 min   Charges:   PT Evaluation $PT Eval Moderate Complexity: 1 Mod PT Treatments $Therapeutic Activity: 8-22 mins        Juron Vorhees M,PT Acute Rehab Services [phone] (pager)   Alvira Philips 10/15/2021, 3:17 PM

## 2021-10-15 NOTE — Progress Notes (Signed)
Patient ID: Seth [name], male   DOB: 04-26-1956, 66 y.o.   MRN: [id number] Is postoperative day 1 first and second ray partial amputations.  The wound VAC is functioning well there is good bleeding with blood in the canister.  Anticipate patient can discharge to home with the portable Praveena wound VAC pump once he is safe with therapy.

## 2021-10-15 NOTE — Progress Notes (Signed)
PROGRESS NOTE  Seth [name] MWN:[id number] DOB: [birth year]   PCP: Lucianne Lei, MD  Patient is from: SNF.  DOA: 10/10/2021 LOS: 5  Chief complaints:  Chief Complaint  Patient presents with   foot/wound infection     Brief Narrative / Interim history: 66 year old M with PMH of CVA with residual right hemiparesis, SDH, seizure, COPD, tobacco use disorder, moderate malnutrition HTN, BPH, depression, gout, PAD s/p laser arthrectomy and stent placement to left popliteal artery on 1/24 by Dr. 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  In ED, HDS, afebrile without leukocytosis.  His wound showed exposed bone.  X-ray showed bone destruction at the stump of the first metatarsal concerning for osteomyelitis.  Orthopedic surgery, Dr. Sharol Given consulted.  He was a started on broad-spectrum antibiotics.  Underwent TMA of first and second metatarsal with wound VAC placement on 2/17.  Remains on broad-spectrum antibiotics   Subjective: Seen and examined earlier this morning.  No major events overnight of this morning.  No complaints.  Pain fairly controlled.  Objective: Vitals:   10/15/21 0452 10/15/21 0753 10/15/21 1258 10/15/21 1300  BP: 120/72 110/71 116/71 116/71  Pulse: 70 74 81 79  Resp: '18 17  16  ' Temp: 98.4 F (36.9 C) 98.2 F (36.8 C)  97.6 F (36.4 C)  TempSrc: Oral Oral  Oral  SpO2: 96% 100%  98%  Weight:      Height:        Examination:  GENERAL: Looks frail.  Nontoxic. HEENT: MMM.  Vision and hearing grossly intact.  NECK: Supple.  No apparent JVD.  RESP:  No IWOB.  Fair aeration bilaterally. CVS:  RRR. Heart sounds normal.  ABD/GI/GU: BS+. Abd soft, NTND.  MSK/EXT:  Moves extremities.  Significant muscle mass and subcu fat loss. S/p left TMA.  SKIN: Dressing over left TMA DTI.  Wound VAC in place. NEURO: Awake and alert. Oriented  appropriately.  No apparent focal neuro deficit. PSYCH: Calm. Normal affect.   Procedures:  None  Microbiology summarized: [id number] and influenza PCR nonreactive. Urine culture with 10,000 colonies of diphtheroids-likely contaminant Blood cultures NGTD.  Assessment and Plan: * Acute osteomyelitis of left first metatarsal stump- (present on admission) -Had left TMA by Dr. Sharol Given on 1/26 for osteomyelitis.  CRP 13.7.  ESR 80. -Underwent left TMA of first and second metatarsal on 2/17 with wound VAC placement. -Continue Vanco/CTX and Flagyl for 24 hours postop -Touchdown weightbearing on the left with postop shoe. -Continue wound VAC for 1 week -Outpatient follow-up in 1 week   Peripheral arterial disease ([place]) -Laser arthrectomy, angioplasty and stent to left PA by Dr. Donzetta Matters on 1/24. -Laser arthrectomy, angioplasty of left ATA, SFA and DES to SFA by Dr. Unk Lightning on 2/15 -On Plavix, aspirin and statin -Encouraged tobacco cessation  Seizures ([place]) Last seen by neurology on 09/01/2021. - Continue Keppra, Vimpat and phenobarbital   Hyponatremia Mild.  Recheck in the morning  Benign essential HTN- (present on admission) - Continue home Toprol  History of CVA (cerebrovascular accident) - Plavix, aspirin and statin as above  Metabolic acidosis - Start p.o. sodium bicarbonate -Recheck in the morning  Generalized weakness - PT/OT eval after surgery  Malnutrition of moderate degree- (present on admission) As evidenced by significant muscle mass loss Nutrition Problem: Moderate Malnutrition Etiology: chronic illness (PAD) Signs/Symptoms: mild fat depletion, mild muscle depletion Interventions: Ensure Enlive (  each supplement provides 350kcal and 20 grams of protein), Juven, MVI  COPD (chronic obstructive pulmonary disease) ([place])- (present on admission) Stable. -Inhalers/nebs as needed -Encourage smoking cessation.  Tobacco use disorder Reports smoking about a pack a day since  the age of 61. -Discussed the importance of smoking cessation especially in regards to wound healing and PAD -Nicotine patch    DVT prophylaxis:  SCD's Start: 10/14/21 1609 heparin injection 5,000 Units Start: 10/12/21 2200 SCDs Start: 10/10/21 2021  Code Status: Full code Family Communication: Patient and/or RN. Available if any question.  Level of care: Med-Surg Status is: Inpatient Remains inpatient appropriate because: Safe disposition intervention.       Final disposition: Likely back to SNF once bed available.      Consultants:  Orthopedic surgery   Sch Meds:  Scheduled Meds:  vitamin C  1,000 mg Oral Daily   aspirin EC  81 mg Oral Daily   clopidogrel  75 mg Oral Q breakfast   docusate sodium  100 mg Oral Daily   feeding supplement  237 mL Oral BID BM   folic acid  1 mg Oral Daily   gabapentin  100 mg Oral TID   heparin  5,000 Units Subcutaneous Q8H   lacosamide  100 mg Oral BID   levETIRAcetam  1,500 mg Oral BID   metoprolol tartrate  50 mg Oral BID   multivitamin with minerals  1 tablet Oral Daily   nicotine  21 mg Transdermal Daily   nutrition supplement (JUVEN)  1 packet Oral BID BM   pantoprazole  40 mg Oral Daily   phenobarbital  32.4 mg Oral Daily   PHENobarbital  64.8 mg Oral QHS   rosuvastatin  20 mg Oral Daily   sertraline  25 mg Oral Daily   sodium bicarbonate  650 mg Oral TID   sodium chloride flush  3 mL Intravenous Q12H   thiamine  100 mg Oral Daily   zinc sulfate  220 mg Oral Daily   Continuous Infusions:  sodium chloride     magnesium sulfate bolus IVPB     PRN Meds:.sodium chloride, acetaminophen, acetaminophen, alum & mag hydroxide-simeth, bisacodyl, guaiFENesin-dextromethorphan, hydrALAZINE, HYDROmorphone (DILAUDID) injection, labetalol, magnesium sulfate bolus IVPB, metoprolol tartrate, ondansetron, oxyCODONE, oxyCODONE, phenol, polyethylene glycol, potassium chloride, sodium chloride flush  Antimicrobials: Anti-infectives (From  admission, onward)    Start     Dose/Rate Route Frequency Ordered Stop   10/13/21 1330  ceFAZolin (ANCEF) IVPB 2g/100 mL premix  Status:  Discontinued        2 g 200 mL/hr over 30 Minutes Intravenous On call to O.R. 10/13/21 1318 10/14/21 0559   10/13/21 1000  vancomycin (VANCOREADY) IVPB 1250 mg/250 mL  Status:  Discontinued        1,250 mg 166.7 mL/hr over 90 Minutes Intravenous Every 12 hours 10/13/21 0839 10/15/21 1246   10/11/21 0000  cefTRIAXone (ROCEPHIN) 2 g in sodium chloride 0.9 % 100 mL IVPB  Status:  Discontinued        2 g 200 mL/hr over 30 Minutes Intravenous Every 24 hours 10/10/21 1958 10/15/21 1246   10/11/21 0000  metroNIDAZOLE (FLAGYL) IVPB 500 mg  Status:  Discontinued        500 mg 100 mL/hr over 60 Minutes Intravenous Every 12 hours 10/10/21 1958 10/15/21 1246   10/10/21 1830  vancomycin (VANCOREADY) IVPB 1750 mg/350 mL  Status:  Discontinued        1,750 mg 175 mL/hr over 120 Minutes Intravenous Every 24  hours 10/10/21 1755 10/13/21 0839   10/10/21 1745  piperacillin-tazobactam (ZOSYN) IVPB 3.375 g        3.375 g 100 mL/hr over 30 Minutes Intravenous  Once 10/10/21 1739 10/10/21 1843        I have personally reviewed the following labs and images: CBC: Recent Labs  Lab 10/10/21 1545 10/11/21 0101 10/12/21 0402 10/13/21 0058 10/14/21 0613  WBC 8.6 7.3 6.9 7.2 6.8  NEUTROABS 5.5  --  4.2 4.8 4.0  HGB 11.4* 9.4* 10.0* 10.3* 9.7*  HCT 37.1* 30.4* 31.5* 31.8* 31.1*  MCV 82.3 80.4 78.4* 78.7* 79.3*  PLT 581* 513* 520* 497* 467*   BMP &GFR Recent Labs  Lab 10/10/21 1545 10/12/21 0402 10/13/21 0058 10/14/21 0613 10/15/21 0206  NA 137 135 133* 136 134*  K 4.9 4.3 4.3 4.1 4.1  CL 103 105 103 105 105  CO2 23 20* 19* 19* 17*  GLUCOSE 87 84 82 84 98  BUN '11 10 10 12 11  ' CREATININE 1.19 1.04 0.92 0.95 1.01  CALCIUM 9.6 9.0 8.9 9.2 9.0  MG  --   --   --   --  1.7  PHOS  --   --   --   --  3.6   Estimated Creatinine Clearance: 75.6 mL/min (by C-G  formula based on SCr of 1.01 mg/dL). Liver & Pancreas: Recent Labs  Lab 10/10/21 1545 10/15/21 0206  AST 20  --   ALT 16  --   ALKPHOS 108  --   BILITOT 0.1*  --   PROT 7.6  --   ALBUMIN 3.3* 2.8*   No results for input(s): LIPASE, AMYLASE in the last 168 hours. No results for input(s): AMMONIA in the last 168 hours. Diabetic: No results for input(s): HGBA1C in the last 72 hours. No results for input(s): GLUCAP in the last 168 hours. Cardiac Enzymes: No results for input(s): CKTOTAL, CKMB, CKMBINDEX, TROPONINI in the last 168 hours. No results for input(s): PROBNP in the last 8760 hours. Coagulation Profile: Recent Labs  Lab 10/10/21 1545  INR 1.1   Thyroid Function Tests: No results for input(s): TSH, T4TOTAL, FREET4, T3FREE, THYROIDAB in the last 72 hours. Lipid Profile: Recent Labs    10/13/21 0058  CHOL 115  HDL 42  LDLCALC 63  TRIG 50  CHOLHDL 2.7   Anemia Panel: No results for input(s): VITAMINB12, FOLATE, FERRITIN, TIBC, IRON, RETICCTPCT in the last 72 hours. Urine analysis:    Component Value Date/Time   COLORURINE YELLOW 10/10/2021 2201   APPEARANCEUR CLEAR 10/10/2021 2201   LABSPEC 1.018 10/10/2021 2201   PHURINE 5.0 10/10/2021 2201   GLUCOSEU NEGATIVE 10/10/2021 2201   HGBUR NEGATIVE 10/10/2021 2201   BILIRUBINUR NEGATIVE 10/10/2021 2201   KETONESUR NEGATIVE 10/10/2021 2201   PROTEINUR NEGATIVE 10/10/2021 2201   NITRITE NEGATIVE 10/10/2021 2201   LEUKOCYTESUR NEGATIVE 10/10/2021 2201   Sepsis Labs: Invalid input(s): PROCALCITONIN, [place]  Microbiology: Recent Results (from the past 240 hour(s))  Urine Culture     Status: Abnormal   Collection Time: 10/10/21  3:36 PM   Specimen: In/Out Cath Urine  Result Value Ref Range Status   Specimen Description IN/OUT CATH URINE  Final   Special Requests NONE  Final   Culture (A)  Final    10,000 COLONIES/mL DIPHTHEROIDS(CORYNEBACTERIUM SPECIES) Standardized susceptibility testing for this  organism is not available. Performed at [facility], [place] [address] [place], [place] [address]    Report Status 10/12/2021 FINAL  Final  Blood Culture (routine x 2)     Status: None   Collection Time: 10/10/21  3:41 PM   Specimen: BLOOD  Result Value Ref Range Status   Specimen Description BLOOD BLOOD RIGHT HAND  Final   Special Requests   Final    BOTTLES DRAWN AEROBIC AND ANAEROBIC Blood Culture results may not be optimal due to an inadequate volume of blood received in culture bottles   Culture   Final    NO GROWTH 5 DAYS Performed at [facility], [place] [address] [place], [place] [address]    Report Status 10/15/2021 FINAL  Final  Blood Culture (routine x 2)     Status: None   Collection Time: 10/10/21  3:45 PM   Specimen: BLOOD  Result Value Ref Range Status   Specimen Description BLOOD BLOOD RIGHT HAND  Final   Special Requests   Final    BOTTLES DRAWN AEROBIC AND ANAEROBIC Blood Culture adequate volume   Culture   Final    NO GROWTH 5 DAYS Performed at [facility], [place] [address], [place], [place] [address]    Report Status 10/15/2021 FINAL  Final  Resp Panel by RT-PCR (Flu A&B, Covid) Nasopharyngeal Swab     Status: None   Collection Time: 10/10/21  6:37 PM   Specimen: Nasopharyngeal Swab; Nasopharyngeal(NP) swabs in vial transport medium  Result Value Ref Range Status   SARS Coronavirus 2 by RT PCR NEGATIVE NEGATIVE Final    Comment: (NOTE) SARS-CoV-2 target nucleic acids are NOT DETECTED.  The SARS-CoV-2 RNA is generally detectable in upper respiratory specimens during the acute phase of infection. The lowest concentration of SARS-CoV-2 viral copies this assay can detect is 138 copies/mL. A negative result does not preclude SARS-Cov-2 infection and should not be used as the sole basis for treatment or other patient management decisions. A negative result may occur with  improper specimen collection/handling, submission of specimen other than  nasopharyngeal swab, presence of viral mutation(s) within the areas targeted by this assay, and inadequate number of viral copies(<138 copies/mL). A negative result must be combined with clinical observations, patient history, and epidemiological information. The expected result is Negative.  Fact Sheet for Patients:  EntrepreneurPulse.com.au  Fact Sheet for Healthcare Providers:  IncredibleEmployment.be  This test is no t yet approved or cleared by the [place] FDA and  has been authorized for detection and/or diagnosis of SARS-CoV-2 by FDA under an Emergency Use Authorization (EUA). This EUA will remain  in effect (meaning this test can be used) for the duration of the COVID-19 declaration under Section 564(b)(1) of the Act, 21 U.S.C.section 360bbb-3(b)(1), unless the authorization is terminated  or revoked sooner.       Influenza A by PCR NEGATIVE NEGATIVE Final   Influenza B by PCR NEGATIVE NEGATIVE Final    Comment: (NOTE) The Xpert Xpress SARS-CoV-2/FLU/RSV plus assay is intended as an aid in the diagnosis of influenza from Nasopharyngeal swab specimens and should not be used as a sole basis for treatment. Nasal washings and aspirates are unacceptable for Xpert Xpress SARS-CoV-2/FLU/RSV testing.  Fact Sheet for Patients: EntrepreneurPulse.com.au  Fact Sheet for Healthcare Providers: IncredibleEmployment.be  This test is not yet approved or cleared by the [place] FDA and has been authorized for detection and/or diagnosis of SARS-CoV-2 by FDA under an Emergency Use Authorization (EUA). This EUA will remain in effect (meaning this test can be used) for the duration of the COVID-19 declaration under Section 564(b)(1) of the  Act, 21 U.S.C. section 360bbb-3(b)(1), unless the authorization is terminated or revoked.  Performed at [facility], [place] [address], [place], [place] [address]    Surgical pcr screen     Status: None   Collection Time: 10/13/21  2:45 PM   Specimen: Nasal Mucosa; Nasal Swab  Result Value Ref Range Status   MRSA, PCR NEGATIVE NEGATIVE Final   Staphylococcus aureus NEGATIVE NEGATIVE Final    Comment: (NOTE) The Xpert SA Assay (FDA approved for NASAL specimens in patients 93 years of age and older), is one component of a comprehensive surveillance program. It is not intended to diagnose infection nor to guide or monitor treatment. Performed at [facility], [place] [address] [place], [place] [address]     Radiology Studies: No results found.    Clela Hagadorn T. [place]  If 7PM-7AM, please contact night-coverage [email or web address] 10/15/2021, 2:04 PM

## 2021-10-16 DIAGNOSIS — W19XXXA Unspecified fall, initial encounter: Secondary | ICD-10-CM

## 2021-10-16 DIAGNOSIS — Y92239 Unspecified place in hospital as the place of occurrence of the external cause: Secondary | ICD-10-CM

## 2021-10-16 LAB — RENAL FUNCTION PANEL
Albumin: 2.7 g/dL — ABNORMAL LOW (ref 3.5–5.0)
Anion gap: 10 (ref 5–15)
BUN: 16 mg/dL (ref 8–23)
CO2: 21 mmol/L — ABNORMAL LOW (ref 22–32)
Calcium: 8.9 mg/dL (ref 8.9–10.3)
Chloride: 105 mmol/L (ref 98–111)
Creatinine, Ser: 0.82 mg/dL (ref 0.61–1.24)
GFR, Estimated: >60 mL/min (ref >60)
Glucose, Bld: 97 mg/dL (ref 70–99)
Phosphorus: 2.7 mg/dL (ref 2.5–4.6)
Potassium: 4 mmol/L (ref 3.5–5.1)
Sodium: 136 mmol/L (ref 135–145)

## 2021-10-16 LAB — CBC
HCT: 29.9 % — ABNORMAL LOW (ref 39.0–52.0)
Hemoglobin: 9.1 g/dL — ABNORMAL LOW (ref 13.0–17.0)
MCH: 24.3 pg — ABNORMAL LOW (ref 26.0–34.0)
MCHC: 30.4 g/dL (ref 30.0–36.0)
MCV: 79.7 fL — ABNORMAL LOW (ref 80.0–100.0)
Platelets: 444 10*3/uL — ABNORMAL HIGH (ref 150–400)
RBC: 3.75 MIL/uL — ABNORMAL LOW (ref 4.22–5.81)
RDW: 16.2 % — ABNORMAL HIGH (ref 11.5–15.5)
WBC: 7.8 10*3/uL (ref 4.0–10.5)
nRBC: 0 % (ref 0.0–0.2)

## 2021-10-16 LAB — MAGNESIUM: Magnesium: 1.8 mg/dL (ref 1.7–2.4)

## 2021-10-16 NOTE — Progress Notes (Addendum)
10/15/21 1536  What Happened  Was fall witnessed? No  Was patient injured? No  Patient found other (Comment) (Pt got self up. Pt not found of fall nor fall witnessed)  Stated prior activity bathroom-unassisted  Follow Up  MD notified Wendee Beavers  Time MD notified 1539  Family notified No - patient refusal  Adult Fall Risk Assessment  Risk Factor Category (scoring not indicated) Not Applicable  Age 66  Fall History: Fall within 6 months prior to admission 0  Elimination; Bowel and/or Urine Incontinence 2  Elimination; Bowel and/or Urine Urgency/Frequency 2  Medications: includes PCA/Opiates, Anti-convulsants, Anti-hypertensives, Diuretics, Hypnotics, Laxatives, Sedatives, and Psychotropics 3  Patient Care Equipment 2  Mobility-Assistance 2  Mobility-Gait 2  Mobility-Sensory Deficit 0  Altered awareness of immediate physical environment 0  Impulsiveness 0  Lack of understanding of one's physical/cognitive limitations 4  Total Score 18  Patient Fall Risk Level High fall risk  Adult Fall Risk Interventions  Required Bundle Interventions *See Row Information* High fall risk - low, moderate, and high requirements implemented  Additional Interventions Use of appropriate toileting equipment (bedpan, BSC, etc.)  Screening for Fall Injury Risk (To be completed on HIGH fall risk patients) - Assessing Need for Floor Mats  Risk For Fall Injury- Criteria for Floor Mats Previous fall this admission  Vitals  Temp 97.8 F (36.6 C)  Temp Source Oral  BP 128/80  MAP (mmHg) 96  BP Location Right Arm  BP Method Automatic  Patient Position (if appropriate) Sitting  Pulse Rate 73  Pulse Rate Source Monitor  Resp 18  Oxygen Therapy  SpO2 100 %  O2 Device Room Air  Pain Assessment  Pain Scale 0-10  Pain Score 3  Pain Type Acute pain  Pain Location Shoulder  Pain Orientation Left  PCA/Epidural/Spinal Assessment  Respiratory Pattern Regular;Unlabored  Neurological  Neuro (WDL) X  Level  of Consciousness Alert  Orientation Level Oriented X4  Cognition Appropriate at baseline  Speech Delayed responses  R Pupil Size (mm) 2  R Pupil Shape Round  R Pupil Reaction Brisk  L Pupil Size (mm) 2  L Pupil Shape Round  L Pupil Reaction Brisk  R Hand Grip Weak (baseline)  L Hand Grip Moderate  R Foot Dorsiflexion Moderate  L Foot Dorsiflexion Weak (baseline)  R Foot Plantar Flexion Moderate  L Foot Plantar Flexion Weak  RUE Motor Response Purposeful movement  RUE Sensation Full sensation  RUE Motor Strength 5  LUE Motor Response Purposeful movement  LUE Sensation Full sensation  LUE Motor Strength 5  RLE Motor Response Purposeful movement  RLE Sensation Full sensation  RLE Motor Strength 5  LLE Motor Response Purposeful movement  LLE Sensation Full sensation  LLE Motor Strength 4  Neuro Symptoms None  Glasgow Coma Scale  Eye Opening 4  Best Verbal Response (NON-intubated) 5  Best Motor Response 6  Glasgow Coma Scale Score 15  Musculoskeletal  Musculoskeletal (WDL) X  Assistive Device Front wheel [place]  Generalized Weakness Yes  Weight Bearing Restrictions Yes  LLE Weight Bearing TWB  Musculoskeletal Details  RLE Limited movement  LLE Limited movement;Amputated toes;Surgery  Integumentary  Integumentary (WDL) X  RN Assisting with Skin Assessment on Admission Hanna RN  Skin Color Appropriate for ethnicity  Skin Condition Dry  Skin Integrity Intact  Pain Assessment  Date Pain First Started 10/15/21 (Stated pain in shoulder)  Result of Injury No  Pain Assessment  Work-Related Injury No   Pt assessed following fall. No  bumps or injuries noted. Pt denies hitting head, only shoulder rating pain 3/10. Pt states he was attempting to get to [facility] and got caught up in [place] wound vac tubing.

## 2021-10-16 NOTE — Assessment & Plan Note (Signed)
Reportedly fell on his backside when he tried to get of the bedside commode by himself.  Was not able to get of the floor.  Denies hitting his head or LOC.  No notable injury or focal neurodeficit. -Fall precaution

## 2021-10-16 NOTE — Progress Notes (Signed)
PROGRESS NOTE  Seth [name] URK:[id number] DOB: July 23, 1956   PCP: Lucianne Lei, MD  Patient is from: SNF.  DOA: 10/10/2021 LOS: 6  Chief complaints:  Chief Complaint  Patient presents with   foot/wound infection     Brief Narrative / Interim history: 66 year old M with PMH of CVA with residual right hemiparesis, SDH, seizure, COPD, tobacco use disorder, moderate malnutrition HTN, BPH, depression, gout, PAD s/p laser arthrectomy and stent placement to left popliteal artery on 1/24 by Dr. 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  In ED, HDS, afebrile without leukocytosis.  His wound showed exposed bone.  X-ray showed bone destruction at the stump of the first metatarsal concerning for osteomyelitis.  Orthopedic surgery, Dr. Sharol Given consulted.  He was a started on broad-spectrum antibiotics.  Underwent TMA of first and second metatarsal with wound VAC placement on 2/17.  Remains on broad-spectrum antibiotics   Subjective: Seen and examined earlier this morning.  No major events overnight of this morning.  No complaints.  He denies pain, shortness of breath, GI or UTI symptoms.  Objective: Vitals:   10/16/21 0005 10/16/21 0428 10/16/21 0752 10/16/21 1100  BP: 122/73 123/73 108/68   Pulse: 67 76 64 74  Resp: _0 Temp: 98.8 F (37.1 C) 98.3 F (36.8 C) 97.9 F (36.6 C) 98.4 F (36.9 C)  TempSrc: Oral Oral Oral Oral  SpO2: 100% 98% 100% 100%  Weight: 76.4 kg     Height:        Examination: GENERAL: Looks frail.  Nontoxic. HEENT: MMM.  Vision and hearing grossly intact.  NECK: Supple.  No apparent JVD.  RESP:  No IWOB.  Fair aeration bilaterally. CVS:  RRR. Heart sounds normal.  ABD/GI/GU: BS+. Abd soft, NTND.  MSK/EXT:  Moves extremities.  Significant muscle mass and subcu fat loss.  Left TMA. SKIN: Dressing and wound VAC over left  TMA. NEURO: Awake and alert. Oriented appropriately.  No apparent focal neuro deficit. PSYCH: Calm. Normal affect.   Procedures:  None  Microbiology summarized: [id number] and influenza PCR nonreactive. Urine culture with 10,000 colonies of diphtheroids-likely contaminant Blood cultures NGTD.  Assessment and Plan: * Acute osteomyelitis of left first metatarsal stump- (present on admission) -Had left TMA by Dr. Sharol Given on 1/26 for osteomyelitis.  CRP 13.7.  ESR 80. -Underwent left TMA of first and second metatarsal on 2/17 with wound VAC placement. -Continue Vanco/CTX and Flagyl for 24 hours postop -Touchdown weightbearing on the left with postop shoe. -Continue wound VAC for 1 week -Outpatient follow-up in 1 week   Fall during current hospitalization Reportedly fell on his backside when he tried to get of the bedside commode by himself.  Was not able to get of the floor.  Denies hitting his head or LOC.  No notable injury or focal neurodeficit. -Fall precaution  Peripheral arterial disease ([place]) -Laser arthrectomy, angioplasty and stent to left PA by Dr. Donzetta Matters on 1/24. -Laser arthrectomy, angioplasty of left ATA, SFA and DES to SFA by Dr. Unk Lightning on 2/15 -On Plavix, aspirin and statin -Encouraged tobacco cessation  Seizures ([place]) Last seen by neurology on 09/01/2021. - Continue Keppra, Vimpat and phenobarbital   Hyponatremia Resolved.  Benign essential HTN- (present on admission) - Continue home Toprol  History of CVA (cerebrovascular accident) - Plavix, aspirin and statin as above  Metabolic acidosis- (present on  admission) Improved. -Continue p.o. sodium bicarbonate -Recheck in the morning  Generalized weakness - PT/OT eval after surgery  Malnutrition of moderate degree- (present on admission) As evidenced by significant muscle mass loss Nutrition Problem: Moderate Malnutrition Etiology: chronic illness (PAD) Signs/Symptoms: mild fat depletion, mild muscle  depletion Interventions: Ensure Enlive (each supplement provides 350kcal and 20 grams of protein), Juven, MVI  COPD (chronic obstructive pulmonary disease) ([place])- (present on admission) Stable. -Inhalers/nebs as needed -Encourage smoking cessation.  Tobacco use disorder- (present on admission) Reports smoking about a pack a day since the age of 22. -Discussed the importance of smoking cessation especially in regards to wound healing and PAD -Nicotine patch    DVT prophylaxis:  SCD's Start: 10/14/21 1609 heparin injection 5,000 Units Start: 10/12/21 2200 SCDs Start: 10/10/21 2021  Code Status: Full code Family Communication: Patient and/or RN. Available if any question.  Level of care: Med-Surg Status is: Inpatient Remains inpatient appropriate because: Safe disposition intervention./SNF     Final disposition: Likely back to SNF once bed available.      Consultants:  Orthopedic surgery   Sch Meds:  Scheduled Meds:  vitamin C  1,000 mg Oral Daily   aspirin EC  81 mg Oral Daily   clopidogrel  75 mg Oral Q breakfast   docusate sodium  100 mg Oral Daily   feeding supplement  237 mL Oral BID BM   folic acid  1 mg Oral Daily   gabapentin  100 mg Oral TID   heparin  5,000 Units Subcutaneous Q8H   lacosamide  100 mg Oral BID   levETIRAcetam  1,500 mg Oral BID   metoprolol tartrate  50 mg Oral BID   multivitamin with minerals  1 tablet Oral Daily   nicotine  21 mg Transdermal Daily   nutrition supplement (JUVEN)  1 packet Oral BID BM   pantoprazole  40 mg Oral Daily   phenobarbital  32.4 mg Oral Daily   PHENobarbital  64.8 mg Oral QHS   rosuvastatin  20 mg Oral Daily   sertraline  25 mg Oral Daily   sodium bicarbonate  650 mg Oral TID   sodium chloride flush  3 mL Intravenous Q12H   thiamine  100 mg Oral Daily   zinc sulfate  220 mg Oral Daily   Continuous Infusions:  sodium chloride     magnesium sulfate bolus IVPB     PRN Meds:.sodium chloride,  acetaminophen, acetaminophen, alum & mag hydroxide-simeth, bisacodyl, guaiFENesin-dextromethorphan, hydrALAZINE, HYDROmorphone (DILAUDID) injection, labetalol, magnesium sulfate bolus IVPB, metoprolol tartrate, ondansetron, oxyCODONE, oxyCODONE, phenol, polyethylene glycol, potassium chloride, sodium chloride flush  Antimicrobials: Anti-infectives (From admission, onward)    Start     Dose/Rate Route Frequency Ordered Stop   10/13/21 1330  ceFAZolin (ANCEF) IVPB 2g/100 mL premix  Status:  Discontinued        2 g 200 mL/hr over 30 Minutes Intravenous On call to O.R. 10/13/21 1318 10/14/21 0559   10/13/21 1000  vancomycin (VANCOREADY) IVPB 1250 mg/250 mL  Status:  Discontinued        1,250 mg 166.7 mL/hr over 90 Minutes Intravenous Every 12 hours 10/13/21 0839 10/15/21 1246   10/11/21 0000  cefTRIAXone (ROCEPHIN) 2 g in sodium chloride 0.9 % 100 mL IVPB  Status:  Discontinued        2 g 200 mL/hr over 30 Minutes Intravenous Every 24 hours 10/10/21 1958 10/15/21 1246   10/11/21 0000  metroNIDAZOLE (FLAGYL) IVPB 500 mg  Status:  Discontinued  500 mg 100 mL/hr over 60 Minutes Intravenous Every 12 hours 10/10/21 1958 10/15/21 1246   10/10/21 1830  vancomycin (VANCOREADY) IVPB 1750 mg/350 mL  Status:  Discontinued        1,750 mg 175 mL/hr over 120 Minutes Intravenous Every 24 hours 10/10/21 1755 10/13/21 0839   10/10/21 1745  piperacillin-tazobactam (ZOSYN) IVPB 3.375 g        3.375 g 100 mL/hr over 30 Minutes Intravenous  Once 10/10/21 1739 10/10/21 1843        I have personally reviewed the following labs and images: CBC: Recent Labs  Lab 10/10/21 1545 10/11/21 0101 10/12/21 0402 10/13/21 0058 10/14/21 0613 10/16/21 0113  WBC 8.6 7.3 6.9 7.2 6.8 7.8  NEUTROABS 5.5  --  4.2 4.8 4.0  --   HGB 11.4* 9.4* 10.0* 10.3* 9.7* 9.1*  HCT 37.1* 30.4* 31.5* 31.8* 31.1* 29.9*  MCV 82.3 80.4 78.4* 78.7* 79.3* 79.7*  PLT 581* 513* 520* 497* 467* 444*   BMP &GFR Recent Labs  Lab  10/12/21 0402 10/13/21 0058 10/14/21 0613 10/15/21 0206 10/16/21 0113  NA 135 133* 136 134* 136  K 4.3 4.3 4.1 4.1 4.0  CL 105 103 105 105 105  CO2 20* 19* 19* 17* 21*  GLUCOSE 84 82 84 98 97  BUN _0 CREATININE 1.04 0.92 0.95 1.01 0.82  CALCIUM 9.0 8.9 9.2 9.0 8.9  MG  --   --   --  1.7 1.8  PHOS  --   --   --  3.6 2.7   Estimated Creatinine Clearance: 98.3 mL/min (by C-G formula based on SCr of 0.82 mg/dL). Liver & Pancreas: Recent Labs  Lab 10/10/21 1545 10/15/21 0206 10/16/21 0113  AST 20  --   --   ALT 16  --   --   ALKPHOS 108  --   --   BILITOT 0.1*  --   --   PROT 7.6  --   --   ALBUMIN 3.3* 2.8* 2.7*   No results for input(s): LIPASE, AMYLASE in the last 168 hours. No results for input(s): AMMONIA in the last 168 hours. Diabetic: No results for input(s): HGBA1C in the last 72 hours. Recent Labs  Lab 10/15/21 2107  GLUCAP 100*   Cardiac Enzymes: No results for input(s): CKTOTAL, CKMB, CKMBINDEX, TROPONINI in the last 168 hours. No results for input(s): PROBNP in the last 8760 hours. Coagulation Profile: Recent Labs  Lab 10/10/21 1545  INR 1.1   Thyroid Function Tests: No results for input(s): TSH, T4TOTAL, FREET4, T3FREE, THYROIDAB in the last 72 hours. Lipid Profile: No results for input(s): CHOL, HDL, LDLCALC, TRIG, CHOLHDL, LDLDIRECT in the last 72 hours.  Anemia Panel: No results for input(s): VITAMINB12, FOLATE, FERRITIN, TIBC, IRON, RETICCTPCT in the last 72 hours. Urine analysis:    Component Value Date/Time   COLORURINE YELLOW 10/10/2021 2201   APPEARANCEUR CLEAR 10/10/2021 2201   LABSPEC 1.018 10/10/2021 2201   PHURINE 5.0 10/10/2021 2201   GLUCOSEU NEGATIVE 10/10/2021 2201   HGBUR NEGATIVE 10/10/2021 2201   BILIRUBINUR NEGATIVE 10/10/2021 2201   KETONESUR NEGATIVE 10/10/2021 2201   PROTEINUR NEGATIVE 10/10/2021 2201   NITRITE NEGATIVE 10/10/2021 2201   LEUKOCYTESUR NEGATIVE 10/10/2021 2201   Sepsis Labs: Invalid  input(s): PROCALCITONIN, [place]  Microbiology: Recent Results (from the past 240 hour(s))  Urine Culture     Status: Abnormal   Collection Time: 10/10/21  3:36 PM   Specimen: In/Out Cath Urine  Result  Value Ref Range Status   Specimen Description IN/OUT CATH URINE  Final   Special Requests NONE  Final   Culture (A)  Final    10,000 COLONIES/mL DIPHTHEROIDS(CORYNEBACTERIUM SPECIES) Standardized susceptibility testing for this organism is not available. Performed at [facility], [place] [address] [place], [place] [address]    Report Status 10/12/2021 FINAL  Final  Blood Culture (routine x 2)     Status: None   Collection Time: 10/10/21  3:41 PM   Specimen: BLOOD  Result Value Ref Range Status   Specimen Description BLOOD BLOOD RIGHT HAND  Final   Special Requests   Final    BOTTLES DRAWN AEROBIC AND ANAEROBIC Blood Culture results may not be optimal due to an inadequate volume of blood received in culture bottles   Culture   Final    NO GROWTH 5 DAYS Performed at [facility], [place] [address] [place], [place] [address]    Report Status 10/15/2021 FINAL  Final  Blood Culture (routine x 2)     Status: None   Collection Time: 10/10/21  3:45 PM   Specimen: BLOOD  Result Value Ref Range Status   Specimen Description BLOOD BLOOD RIGHT HAND  Final   Special Requests   Final    BOTTLES DRAWN AEROBIC AND ANAEROBIC Blood Culture adequate volume   Culture   Final    NO GROWTH 5 DAYS Performed at [facility], [place] [address], [place], [place] [address]    Report Status 10/15/2021 FINAL  Final  Resp Panel by RT-PCR (Flu A&B, Covid) Nasopharyngeal Swab     Status: None   Collection Time: 10/10/21  6:37 PM   Specimen: Nasopharyngeal Swab; Nasopharyngeal(NP) swabs in vial transport medium  Result Value Ref Range Status   SARS Coronavirus 2 by RT PCR NEGATIVE NEGATIVE Final    Comment: (NOTE) SARS-CoV-2 target nucleic acids are NOT DETECTED.  The SARS-CoV-2 RNA is  generally detectable in upper respiratory specimens during the acute phase of infection. The lowest concentration of SARS-CoV-2 viral copies this assay can detect is 138 copies/mL. A negative result does not preclude SARS-Cov-2 infection and should not be used as the sole basis for treatment or other patient management decisions. A negative result may occur with  improper specimen collection/handling, submission of specimen other than nasopharyngeal swab, presence of viral mutation(s) within the areas targeted by this assay, and inadequate number of viral copies(<138 copies/mL). A negative result must be combined with clinical observations, patient history, and epidemiological information. The expected result is Negative.  Fact Sheet for Patients:  EntrepreneurPulse.com.au  Fact Sheet for Healthcare Providers:  IncredibleEmployment.be  This test is no t yet approved or cleared by the [place] FDA and  has been authorized for detection and/or diagnosis of SARS-CoV-2 by FDA under an Emergency Use Authorization (EUA). This EUA will remain  in effect (meaning this test can be used) for the duration of the COVID-19 declaration under Section 564(b)(1) of the Act, 21 U.S.C.section 360bbb-3(b)(1), unless the authorization is terminated  or revoked sooner.       Influenza A by PCR NEGATIVE NEGATIVE Final   Influenza B by PCR NEGATIVE NEGATIVE Final    Comment: (NOTE) The Xpert Xpress SARS-CoV-2/FLU/RSV plus assay is intended as an aid in the diagnosis of influenza from Nasopharyngeal swab specimens and should not be used as a sole basis for treatment. Nasal washings and aspirates are unacceptable for Xpert Xpress SARS-CoV-2/FLU/RSV testing.  Fact Sheet for  Patients: EntrepreneurPulse.com.au  Fact Sheet for Healthcare Providers: IncredibleEmployment.be  This test is not yet approved or cleared by the [place] FDA and has been authorized for detection and/or diagnosis of SARS-CoV-2 by FDA under an Emergency Use Authorization (EUA). This EUA will remain in effect (meaning this test can be used) for the duration of the COVID-19 declaration under Section 564(b)(1) of the Act, 21 U.S.C. section 360bbb-3(b)(1), unless the authorization is terminated or revoked.  Performed at [facility], [place] [address] [place], [place] [address]   Surgical pcr screen     Status: None   Collection Time: 10/13/21  2:45 PM   Specimen: Nasal Mucosa; Nasal Swab  Result Value Ref Range Status   MRSA, PCR NEGATIVE NEGATIVE Final   Staphylococcus aureus NEGATIVE NEGATIVE Final    Comment: (NOTE) The Xpert SA Assay (FDA approved for NASAL specimens in patients 76 years of age and older), is one component of a comprehensive surveillance program. It is not intended to diagnose infection nor to guide or monitor treatment. Performed at [facility], [place] [address], [place], [place] [address]     Radiology Studies: No results found.     T. [place]  If 7PM-7AM, please contact night-coverage [email or web address] 10/16/2021, 2:38 PM

## 2021-10-17 MED ORDER — ALUM & MAG HYDROXIDE-SIMETH 200-200-20 MG/5ML PO SUSP: 15.0000 mL | ORAL | Status: DC | PRN

## 2021-10-17 NOTE — TOC Progression Note (Signed)
Transition of Care [facility]) - Progression Note    Patient Details  Name: Seth [name] MRN: [id number] Date of Birth: 01-07-1956  Transition of Care [facility]) CM/SW [place], [place] Phone Number: 10/17/2021, 3:16 PM  Clinical Narrative:    Pt is currently medically stable and has confirmed he would like to return to [facility]. They stated they can accept him in the morning. He is LTC and will not need an auth or a covid test. Pt will need transport to facility. TOC will continue to follow for DC needs.   Expected Discharge Plan: [place] Barriers to Discharge: Continued Medical Work up  Expected Discharge Plan and Services Expected Discharge Plan: [place] In-house Referral: Clinical Social Work     Living arrangements for the past 2 months: [place]                                       Social Determinants of Health (SDOH) Interventions    Readmission Risk Interventions Readmission Risk Prevention Plan 06/04/2019  Transportation Screening Complete  PCP or Specialist Appt within 5-7 Days Complete  Home Care Screening Complete  Medication Review (RN CM) Complete  Some recent data might be hidden

## 2021-10-17 NOTE — Progress Notes (Signed)
Mobility Specialist Progress Note   10/17/21 1214  Therapy Vitals  Pulse Rate 77  Resp 17  BP 123/80  Patient Position (if appropriate) Lying  Pain Assessment  Pain Assessment No/denies pain  Oxygen Therapy  SpO2 98 %  O2 Device Room Air  Mobility  Activity Ambulated with assistance in room  Level of Assistance Minimal assist, patient does 75% or more  LLE Weight Bearing TWB  Distance Ambulated (ft) 26 ft  Activity Response Tolerated well  $Mobility charge 1 Mobility   Pt understanding WB precautions better this session. Demonstrating a slow heel strike while maintaining good balance on RW. No reported pain throughout. Pt needing inc time for ambulation. Left back in bed w/ call bell in reach and bed alarm on.  Holland Falling Mobility Specialist Phone Number [phone]

## 2021-10-17 NOTE — Care Management Important Message (Signed)
Important Message  Patient Details  Name: Seth [name] MRN: [id number] Date of Birth: September 03, 1955   Medicare Important Message Given:  Yes     [name] 10/17/2021, 8:39 AM

## 2021-10-17 NOTE — Progress Notes (Signed)
PROGRESS NOTE  Seth [name] [name]:[id number] DOB: 20-Jul-1956   PCP: Lucianne Lei, MD  Patient is from: SNF.  DOA: 10/10/2021 LOS: 7  Chief complaints:  Chief Complaint  Patient presents with   foot/wound infection     Brief Narrative / Interim history: 66 year old M with PMH of CVA with residual right hemiparesis, SDH, seizure, COPD, tobacco use disorder, moderate malnutrition HTN, BPH, depression, gout, PAD s/p laser arthrectomy and stent placement to left popliteal artery on 1/24 by Dr. 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  In ED, HDS, afebrile without leukocytosis.  His wound showed exposed bone.  X-ray showed bone destruction at the stump of the first metatarsal concerning for osteomyelitis.  Orthopedic surgery, Dr. Sharol Given consulted.  He was a started on broad-spectrum antibiotics.  Underwent TMA of first and second metatarsal with wound VAC placement on 2/17.  Broad-spectrum antibiotics discontinued on 2/18.  Patient is optimized for discharge to SNF once bed available.   Subjective: Seen and examined earlier this morning.  No major events overnight of this morning.  No complaints.  He denies pain, respiratory symptoms, GI or UTI symptoms.  No focal neuro symptoms.  Objective: Vitals:   10/17/21 0344 10/17/21 0345 10/17/21 0754 10/17/21 1214  BP: 123/72  124/72 123/80  Pulse: 76  72 77  Resp: _0 Temp: 98.2 F (36.8 C)  98.8 F (37.1 C) 98.6 F (37 C)  TempSrc: Oral  Oral Oral  SpO2: 98%  95% 98%  Weight:  72.3 kg    Height:        Examination:  GENERAL: Looks frail.  Nontoxic. HEENT: MMM.  Vision and hearing grossly intact.  NECK: Supple.  No apparent JVD.  RESP:  No IWOB.  Fair aeration bilaterally. CVS:  RRR. Heart sounds normal.  ABD/GI/GU: BS+. Abd soft, NTND.  MSK/EXT:  Moves extremities.  Left TMA.  Significant  muscle mass and subcu fat loss. SKIN: no apparent skin lesion or wound NEURO: Awake and alert. Oriented appropriately.  No apparent focal neuro deficit. PSYCH: Calm. Normal affect.   Procedures:  None  Microbiology summarized: [id number] and influenza PCR nonreactive. Urine culture with 10,000 colonies of diphtheroids-likely contaminant Blood cultures NGTD.  Assessment and Plan: * Acute osteomyelitis of left first metatarsal stump- (present on admission) -Had left TMA by Dr. Sharol Given on 1/26 for osteomyelitis.  CRP 13.7.  ESR 80. -Underwent left TMA of first and second metatarsal on 2/17 with wound VAC placement. -Vanco/CTX and Flagyl discontinued 24 hours postop per Ortho recommendation. -Touchdown weightbearing on the left with postop shoe. -Continue wound VAC for 1 week -Outpatient follow-up in 1 week   Fall during current hospitalization Reportedly fell on his backside when he tried to get of the bedside commode by himself.  Was not able to get of the floor.  Denies hitting his head or LOC.  No notable injury or focal neurodeficit. -Fall precaution  Peripheral arterial disease ([place]) -Laser arthrectomy, angioplasty and stent to left PA by Dr. Donzetta Matters on 1/24. -Laser arthrectomy, angioplasty of left ATA, SFA and DES to SFA by Dr. Unk Lightning on 2/15 -On Plavix, aspirin and statin -Encouraged tobacco cessation  Seizures ([place]) Last seen by neurology on 09/01/2021. - Continue Keppra, Vimpat and phenobarbital   Hyponatremia Resolved.  Benign essential HTN- (present on admission) - Continue home Toprol  History of CVA (  cerebrovascular accident) - Plavix, aspirin and statin as above  Metabolic acidosis- (present on admission) Improved. -Continue p.o. sodium bicarbonate -Recheck in the morning  Generalized weakness - Continue PT/OT  Malnutrition of moderate degree- (present on admission) As evidenced by significant muscle mass loss Nutrition Problem: Moderate  Malnutrition Etiology: chronic illness (PAD) Signs/Symptoms: mild fat depletion, mild muscle depletion Interventions: Ensure Enlive (each supplement provides 350kcal and 20 grams of protein), Juven, MVI  COPD (chronic obstructive pulmonary disease) ([place])- (present on admission) Stable. -Inhalers/nebs as needed -Encourage smoking cessation.  Tobacco use disorder- (present on admission) Reports smoking about a pack a day since the age of 15. -Discussed the importance of smoking cessation especially in regards to wound healing and PAD -Nicotine patch    DVT prophylaxis:  SCD's Start: 10/14/21 1609 heparin injection 5,000 Units Start: 10/12/21 2200 SCDs Start: 10/10/21 2021  Code Status: Full code Family Communication: Patient and/or RN. Available if any question.  Level of care: Med-Surg Status is: Inpatient Remains inpatient appropriate because: Safe disposition intervention./SNF.  Medically stable.     Final disposition: Likely back to SNF once bed available.      Consultants:  Orthopedic surgery   Sch Meds:  Scheduled Meds:  vitamin C  1,000 mg Oral Daily   aspirin EC  81 mg Oral Daily   clopidogrel  75 mg Oral Q breakfast   docusate sodium  100 mg Oral Daily   feeding supplement  237 mL Oral BID BM   folic acid  1 mg Oral Daily   gabapentin  100 mg Oral TID   heparin  5,000 Units Subcutaneous Q8H   lacosamide  100 mg Oral BID   levETIRAcetam  1,500 mg Oral BID   metoprolol tartrate  50 mg Oral BID   multivitamin with minerals  1 tablet Oral Daily   nicotine  21 mg Transdermal Daily   nutrition supplement (JUVEN)  1 packet Oral BID BM   pantoprazole  40 mg Oral Daily   phenobarbital  32.4 mg Oral Daily   PHENobarbital  64.8 mg Oral QHS   rosuvastatin  20 mg Oral Daily   sertraline  25 mg Oral Daily   sodium bicarbonate  650 mg Oral TID   sodium chloride flush  3 mL Intravenous Q12H   thiamine  100 mg Oral Daily   zinc sulfate  220 mg Oral Daily    Continuous Infusions:  sodium chloride     magnesium sulfate bolus IVPB     PRN Meds:.sodium chloride, acetaminophen, acetaminophen, alum & mag hydroxide-simeth, bisacodyl, guaiFENesin-dextromethorphan, hydrALAZINE, HYDROmorphone (DILAUDID) injection, labetalol, magnesium sulfate bolus IVPB, metoprolol tartrate, ondansetron, oxyCODONE, oxyCODONE, phenol, polyethylene glycol, potassium chloride, sodium chloride flush  Antimicrobials: Anti-infectives (From admission, onward)    Start     Dose/Rate Route Frequency Ordered Stop   10/13/21 1330  ceFAZolin (ANCEF) IVPB 2g/100 mL premix  Status:  Discontinued        2 g 200 mL/hr over 30 Minutes Intravenous On call to O.R. 10/13/21 1318 10/14/21 0559   10/13/21 1000  vancomycin (VANCOREADY) IVPB 1250 mg/250 mL  Status:  Discontinued        1,250 mg 166.7 mL/hr over 90 Minutes Intravenous Every 12 hours 10/13/21 0839 10/15/21 1246   10/11/21 0000  cefTRIAXone (ROCEPHIN) 2 g in sodium chloride 0.9 % 100 mL IVPB  Status:  Discontinued        2 g 200 mL/hr over 30 Minutes Intravenous Every 24 hours 10/10/21 1958 10/15/21 1246  10/11/21 0000  metroNIDAZOLE (FLAGYL) IVPB 500 mg  Status:  Discontinued        500 mg 100 mL/hr over 60 Minutes Intravenous Every 12 hours 10/10/21 1958 10/15/21 1246   10/10/21 1830  vancomycin (VANCOREADY) IVPB 1750 mg/350 mL  Status:  Discontinued        1,750 mg 175 mL/hr over 120 Minutes Intravenous Every 24 hours 10/10/21 1755 10/13/21 0839   10/10/21 1745  piperacillin-tazobactam (ZOSYN) IVPB 3.375 g        3.375 g 100 mL/hr over 30 Minutes Intravenous  Once 10/10/21 1739 10/10/21 1843        I have personally reviewed the following labs and images: CBC: Recent Labs  Lab 10/10/21 1545 10/11/21 0101 10/12/21 0402 10/13/21 0058 10/14/21 0613 10/16/21 0113  WBC 8.6 7.3 6.9 7.2 6.8 7.8  NEUTROABS 5.5  --  4.2 4.8 4.0  --   HGB 11.4* 9.4* 10.0* 10.3* 9.7* 9.1*  HCT 37.1* 30.4* 31.5* 31.8* 31.1* 29.9*   MCV 82.3 80.4 78.4* 78.7* 79.3* 79.7*  PLT 581* 513* 520* 497* 467* 444*   BMP &GFR Recent Labs  Lab 10/12/21 0402 10/13/21 0058 10/14/21 0613 10/15/21 0206 10/16/21 0113  NA 135 133* 136 134* 136  K 4.3 4.3 4.1 4.1 4.0  CL 105 103 105 105 105  CO2 20* 19* 19* 17* 21*  GLUCOSE 84 82 84 98 97  BUN _0 CREATININE 1.04 0.92 0.95 1.01 0.82  CALCIUM 9.0 8.9 9.2 9.0 8.9  MG  --   --   --  1.7 1.8  PHOS  --   --   --  3.6 2.7   Estimated Creatinine Clearance: 93.1 mL/min (by C-G formula based on SCr of 0.82 mg/dL). Liver & Pancreas: Recent Labs  Lab 10/10/21 1545 10/15/21 0206 10/16/21 0113  AST 20  --   --   ALT 16  --   --   ALKPHOS 108  --   --   BILITOT 0.1*  --   --   PROT 7.6  --   --   ALBUMIN 3.3* 2.8* 2.7*   No results for input(s): LIPASE, AMYLASE in the last 168 hours. No results for input(s): AMMONIA in the last 168 hours. Diabetic: No results for input(s): HGBA1C in the last 72 hours. Recent Labs  Lab 10/15/21 2107  GLUCAP 100*   Cardiac Enzymes: No results for input(s): CKTOTAL, CKMB, CKMBINDEX, TROPONINI in the last 168 hours. No results for input(s): PROBNP in the last 8760 hours. Coagulation Profile: Recent Labs  Lab 10/10/21 1545  INR 1.1   Thyroid Function Tests: No results for input(s): TSH, T4TOTAL, FREET4, T3FREE, THYROIDAB in the last 72 hours. Lipid Profile: No results for input(s): CHOL, HDL, LDLCALC, TRIG, CHOLHDL, LDLDIRECT in the last 72 hours.  Anemia Panel: No results for input(s): VITAMINB12, FOLATE, FERRITIN, TIBC, IRON, RETICCTPCT in the last 72 hours. Urine analysis:    Component Value Date/Time   COLORURINE YELLOW 10/10/2021 2201   APPEARANCEUR CLEAR 10/10/2021 2201   LABSPEC 1.018 10/10/2021 2201   PHURINE 5.0 10/10/2021 2201   GLUCOSEU NEGATIVE 10/10/2021 2201   HGBUR NEGATIVE 10/10/2021 2201   BILIRUBINUR NEGATIVE 10/10/2021 2201   KETONESUR NEGATIVE 10/10/2021 2201   PROTEINUR NEGATIVE 10/10/2021  2201   NITRITE NEGATIVE 10/10/2021 2201   LEUKOCYTESUR NEGATIVE 10/10/2021 2201   Sepsis Labs: Invalid input(s): PROCALCITONIN, [place]  Microbiology: Recent Results (from the past 240 hour(s))  Urine Culture  Status: Abnormal   Collection Time: 10/10/21  3:36 PM   Specimen: In/Out Cath Urine  Result Value Ref Range Status   Specimen Description IN/OUT CATH URINE  Final   Special Requests NONE  Final   Culture (A)  Final    10,000 COLONIES/mL DIPHTHEROIDS(CORYNEBACTERIUM SPECIES) Standardized susceptibility testing for this organism is not available. Performed at [facility], [place] [address] [place], [place] [address]    Report Status 10/12/2021 FINAL  Final  Blood Culture (routine x 2)     Status: None   Collection Time: 10/10/21  3:41 PM   Specimen: BLOOD  Result Value Ref Range Status   Specimen Description BLOOD BLOOD RIGHT HAND  Final   Special Requests   Final    BOTTLES DRAWN AEROBIC AND ANAEROBIC Blood Culture results may not be optimal due to an inadequate volume of blood received in culture bottles   Culture   Final    NO GROWTH 5 DAYS Performed at [facility], [place] [address] [place], [place] [address]    Report Status 10/15/2021 FINAL  Final  Blood Culture (routine x 2)     Status: None   Collection Time: 10/10/21  3:45 PM   Specimen: BLOOD  Result Value Ref Range Status   Specimen Description BLOOD BLOOD RIGHT HAND  Final   Special Requests   Final    BOTTLES DRAWN AEROBIC AND ANAEROBIC Blood Culture adequate volume   Culture   Final    NO GROWTH 5 DAYS Performed at [facility], [place] [address] [place], [place] [address]    Report Status 10/15/2021 FINAL  Final  Resp Panel by RT-PCR (Flu A&B, Covid) Nasopharyngeal Swab     Status: None   Collection Time: 10/10/21  6:37 PM   Specimen: Nasopharyngeal Swab; Nasopharyngeal(NP) swabs in vial transport medium  Result Value Ref Range Status   SARS Coronavirus 2 by RT PCR NEGATIVE  NEGATIVE Final    Comment: (NOTE) SARS-CoV-2 target nucleic acids are NOT DETECTED.  The SARS-CoV-2 RNA is generally detectable in upper respiratory specimens during the acute phase of infection. The lowest concentration of SARS-CoV-2 viral copies this assay can detect is 138 copies/mL. A negative result does not preclude SARS-Cov-2 infection and should not be used as the sole basis for treatment or other patient management decisions. A negative result may occur with  improper specimen collection/handling, submission of specimen other than nasopharyngeal swab, presence of viral mutation(s) within the areas targeted by this assay, and inadequate number of viral copies(<138 copies/mL). A negative result must be combined with clinical observations, patient history, and epidemiological information. The expected result is Negative.  Fact Sheet for Patients:  EntrepreneurPulse.com.au  Fact Sheet for Healthcare Providers:  IncredibleEmployment.be  This test is no t yet approved or cleared by the [place] FDA and  has been authorized for detection and/or diagnosis of SARS-CoV-2 by FDA under an Emergency Use Authorization (EUA). This EUA will remain  in effect (meaning this test can be used) for the duration of the COVID-19 declaration under Section 564(b)(1) of the Act, 21 U.S.C.section 360bbb-3(b)(1), unless the authorization is terminated  or revoked sooner.       Influenza A by PCR NEGATIVE NEGATIVE Final   Influenza B by PCR NEGATIVE NEGATIVE Final    Comment: (NOTE) The Xpert Xpress SARS-CoV-2/FLU/RSV plus assay is intended as an aid in the diagnosis of influenza from Nasopharyngeal swab specimens and should not be used as a sole  basis for treatment. Nasal washings and aspirates are unacceptable for Xpert Xpress SARS-CoV-2/FLU/RSV testing.  Fact Sheet for Patients: EntrepreneurPulse.com.au  Fact Sheet for Healthcare  Providers: IncredibleEmployment.be  This test is not yet approved or cleared by the [place] FDA and has been authorized for detection and/or diagnosis of SARS-CoV-2 by FDA under an Emergency Use Authorization (EUA). This EUA will remain in effect (meaning this test can be used) for the duration of the COVID-19 declaration under Section 564(b)(1) of the Act, 21 U.S.C. section 360bbb-3(b)(1), unless the authorization is terminated or revoked.  Performed at [facility], [place] [address] [place], [place] [address]   Surgical pcr screen     Status: None   Collection Time: 10/13/21  2:45 PM   Specimen: Nasal Mucosa; Nasal Swab  Result Value Ref Range Status   MRSA, PCR NEGATIVE NEGATIVE Final   Staphylococcus aureus NEGATIVE NEGATIVE Final    Comment: (NOTE) The Xpert SA Assay (FDA approved for NASAL specimens in patients 81 years of age and older), is one component of a comprehensive surveillance program. It is not intended to diagnose infection nor to guide or monitor treatment. Performed at [facility], [place] [address], [place],  [address]     Radiology Studies: No results found.    Rossi Burdo T. [place]  If 7PM-7AM, please contact night-coverage [email or web address] 10/17/2021, 2:25 PM

## 2021-10-18 DIAGNOSIS — L89159 Pressure ulcer of sacral region, unspecified stage: Secondary | ICD-10-CM | POA: Diagnosis not present

## 2021-10-18 DIAGNOSIS — I639 Cerebral infarction, unspecified: Secondary | ICD-10-CM | POA: Diagnosis not present

## 2021-10-18 DIAGNOSIS — I10 Essential (primary) hypertension: Secondary | ICD-10-CM | POA: Diagnosis not present

## 2021-10-18 DIAGNOSIS — H18413 Arcus senilis, bilateral: Secondary | ICD-10-CM | POA: Diagnosis not present

## 2021-10-18 DIAGNOSIS — M6281 Muscle weakness (generalized): Secondary | ICD-10-CM | POA: Diagnosis not present

## 2021-10-18 DIAGNOSIS — W19XXXA Unspecified fall, initial encounter: Secondary | ICD-10-CM | POA: Diagnosis not present

## 2021-10-18 DIAGNOSIS — E785 Hyperlipidemia, unspecified: Secondary | ICD-10-CM | POA: Diagnosis not present

## 2021-10-18 DIAGNOSIS — K2100 Gastro-esophageal reflux disease with esophagitis, without bleeding: Secondary | ICD-10-CM | POA: Diagnosis not present

## 2021-10-18 DIAGNOSIS — Z8673 Personal history of transient ischemic attack (TIA), and cerebral infarction without residual deficits: Secondary | ICD-10-CM | POA: Diagnosis not present

## 2021-10-18 DIAGNOSIS — S065X0S Traumatic subdural hemorrhage without loss of consciousness, sequela: Secondary | ICD-10-CM | POA: Diagnosis not present

## 2021-10-18 DIAGNOSIS — R5381 Other malaise: Secondary | ICD-10-CM | POA: Diagnosis not present

## 2021-10-18 DIAGNOSIS — M869 Osteomyelitis, unspecified: Secondary | ICD-10-CM | POA: Diagnosis not present

## 2021-10-18 DIAGNOSIS — H2513 Age-related nuclear cataract, bilateral: Secondary | ICD-10-CM | POA: Diagnosis not present

## 2021-10-18 DIAGNOSIS — R296 Repeated falls: Secondary | ICD-10-CM | POA: Diagnosis not present

## 2021-10-18 DIAGNOSIS — Z743 Need for continuous supervision: Secondary | ICD-10-CM | POA: Diagnosis not present

## 2021-10-18 DIAGNOSIS — A419 Sepsis, unspecified organism: Secondary | ICD-10-CM | POA: Diagnosis not present

## 2021-10-18 DIAGNOSIS — I739 Peripheral vascular disease, unspecified: Secondary | ICD-10-CM | POA: Diagnosis not present

## 2021-10-18 DIAGNOSIS — Z89432 Acquired absence of left foot: Secondary | ICD-10-CM | POA: Diagnosis not present

## 2021-10-18 DIAGNOSIS — E0801 Diabetes mellitus due to underlying condition with hyperosmolarity with coma: Secondary | ICD-10-CM | POA: Diagnosis not present

## 2021-10-18 DIAGNOSIS — J449 Chronic obstructive pulmonary disease, unspecified: Secondary | ICD-10-CM | POA: Diagnosis not present

## 2021-10-18 DIAGNOSIS — S06300A Unspecified focal traumatic brain injury without loss of consciousness, initial encounter: Secondary | ICD-10-CM | POA: Diagnosis not present

## 2021-10-18 DIAGNOSIS — R569 Unspecified convulsions: Secondary | ICD-10-CM | POA: Diagnosis not present

## 2021-10-18 DIAGNOSIS — M86272 Subacute osteomyelitis, left ankle and foot: Secondary | ICD-10-CM | POA: Diagnosis not present

## 2021-10-18 DIAGNOSIS — Y92239 Unspecified place in hospital as the place of occurrence of the external cause: Secondary | ICD-10-CM | POA: Diagnosis not present

## 2021-10-18 DIAGNOSIS — R1312 Dysphagia, oropharyngeal phase: Secondary | ICD-10-CM | POA: Diagnosis not present

## 2021-10-18 DIAGNOSIS — L089 Local infection of the skin and subcutaneous tissue, unspecified: Secondary | ICD-10-CM | POA: Diagnosis not present

## 2021-10-18 DIAGNOSIS — Z89439 Acquired absence of unspecified foot: Secondary | ICD-10-CM | POA: Diagnosis not present

## 2021-10-18 DIAGNOSIS — I69351 Hemiplegia and hemiparesis following cerebral infarction affecting right dominant side: Secondary | ICD-10-CM | POA: Diagnosis not present

## 2021-10-18 DIAGNOSIS — G9340 Encephalopathy, unspecified: Secondary | ICD-10-CM | POA: Diagnosis not present

## 2021-10-18 DIAGNOSIS — R531 Weakness: Secondary | ICD-10-CM | POA: Diagnosis not present

## 2021-10-18 DIAGNOSIS — R278 Other lack of coordination: Secondary | ICD-10-CM | POA: Diagnosis not present

## 2021-10-18 DIAGNOSIS — H903 Sensorineural hearing loss, bilateral: Secondary | ICD-10-CM | POA: Diagnosis not present

## 2021-10-18 DIAGNOSIS — H5213 Myopia, bilateral: Secondary | ICD-10-CM | POA: Diagnosis not present

## 2021-10-18 DIAGNOSIS — R262 Difficulty in walking, not elsewhere classified: Secondary | ICD-10-CM | POA: Diagnosis not present

## 2021-10-18 DIAGNOSIS — U071 COVID-19: Secondary | ICD-10-CM | POA: Diagnosis not present

## 2021-10-18 DIAGNOSIS — N179 Acute kidney failure, unspecified: Secondary | ICD-10-CM | POA: Diagnosis not present

## 2021-10-18 DIAGNOSIS — H40013 Open angle with borderline findings, low risk, bilateral: Secondary | ICD-10-CM | POA: Diagnosis not present

## 2021-10-18 DIAGNOSIS — E440 Moderate protein-calorie malnutrition: Secondary | ICD-10-CM | POA: Diagnosis not present

## 2021-10-18 DIAGNOSIS — R2689 Other abnormalities of gait and mobility: Secondary | ICD-10-CM | POA: Diagnosis not present

## 2021-10-18 DIAGNOSIS — F32A Depression, unspecified: Secondary | ICD-10-CM | POA: Diagnosis not present

## 2021-10-18 LAB — CBC
HCT: 29.5 % — ABNORMAL LOW (ref 39.0–52.0)
Hemoglobin: 9.3 g/dL — ABNORMAL LOW (ref 13.0–17.0)
MCH: 24.7 pg — ABNORMAL LOW (ref 26.0–34.0)
MCHC: 31.5 g/dL (ref 30.0–36.0)
MCV: 78.5 fL — ABNORMAL LOW (ref 80.0–100.0)
Platelets: 476 10*3/uL — ABNORMAL HIGH (ref 150–400)
RBC: 3.76 MIL/uL — ABNORMAL LOW (ref 4.22–5.81)
RDW: 16.5 % — ABNORMAL HIGH (ref 11.5–15.5)
WBC: 6.4 10*3/uL (ref 4.0–10.5)
nRBC: 0 % (ref 0.0–0.2)

## 2021-10-18 LAB — RENAL FUNCTION PANEL
Albumin: 2.7 g/dL — ABNORMAL LOW (ref 3.5–5.0)
Anion gap: 8 (ref 5–15)
BUN: 16 mg/dL (ref 8–23)
CO2: 24 mmol/L (ref 22–32)
Calcium: 9.1 mg/dL (ref 8.9–10.3)
Chloride: 104 mmol/L (ref 98–111)
Creatinine, Ser: 0.84 mg/dL (ref 0.61–1.24)
GFR, Estimated: >60 mL/min (ref >60)
Glucose, Bld: 92 mg/dL (ref 70–99)
Phosphorus: 3.6 mg/dL (ref 2.5–4.6)
Potassium: 4.2 mmol/L (ref 3.5–5.1)
Sodium: 136 mmol/L (ref 135–145)

## 2021-10-18 LAB — MAGNESIUM: Magnesium: 1.9 mg/dL (ref 1.7–2.4)

## 2021-10-18 MED ORDER — PHENOBARBITAL 32.4 MG PO TABS: 32.4000 mg | ORAL_TABLET | Freq: Two times a day (BID) | ORAL | 1 refills | Status: DC

## 2021-10-18 MED ORDER — ZINC SULFATE 220 (50 ZN) MG PO CAPS: 220.0000 mg | ORAL_CAPSULE | Freq: Every day | ORAL | Status: DC

## 2021-10-18 MED ORDER — ACETAMINOPHEN 325 MG PO TABS: 650.0000 mg | ORAL_TABLET | Freq: Four times a day (QID) | ORAL | Status: DC | PRN

## 2021-10-18 MED ORDER — FERROUS SULFATE 325 (65 FE) MG PO TABS: 325.0000 mg | ORAL_TABLET | Freq: Two times a day (BID) | ORAL | 3 refills | Status: DC

## 2021-10-18 MED ORDER — ASCORBIC ACID 500 MG PO TABS: 500.0000 mg | ORAL_TABLET | Freq: Every day | ORAL | Status: DC

## 2021-10-18 MED ORDER — SENNOSIDES-DOCUSATE SODIUM 8.6-50 MG PO TABS: 1.0000 | ORAL_TABLET | Freq: Two times a day (BID) | ORAL | 0 refills | Status: DC | PRN

## 2021-10-18 NOTE — NC FL2 (Signed)
[place] LEVEL OF CARE SCREENING TOOL     IDENTIFICATION  Patient Name: Seth [name] Birthdate: Apr 18, 1956 Sex: male Admission Date (Current Location): 10/10/2021  [place] and [place] Number:  Kathleen Argue [id number] [place] and Address:  [facility] [place], [place] [address] [place], [place] [address]      Provider Number: [id number]  Attending Physician Name and Address:  Mercy Riding, MD  Relative Name and Phone Number:  Carlos Heber, daughter    Current Level of Care: Hospital Recommended Level of Care: [place] Prior Approval Number:    Date Approved/Denied: 07/21/20 PASRR Number: [id number]  Discharge Plan: SNF    Current Diagnoses: Patient Active Problem List   Diagnosis Date Noted   Fall during current hospitalization 10/16/2021   S/P transmetatarsal amputation of foot, left ([place])    Peripheral arterial disease ([place])    Cerebral thrombosis with cerebral infarction 05/18/2021   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, not intractable, with status epilepticus ([place])    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[place])    Seizures ([place]) 05/17/2021   Pressure injury of skin 03/14/2021   Benign essential HTN    Benign prostatic hyperplasia    Depression    Hyponatremia    Nonsustained ventricular tachycardia    Alcohol use 01/01/2021   History of CVA (cerebrovascular accident) 01/01/2021   COVID-19 virus infection 09/14/2020   Acute osteomyelitis of left first metatarsal stump    Metabolic acidosis 25/42/7062   Generalized weakness 02/19/2019   Left fibular fracture 02/19/2019   Chronic alcoholism ([place]) 02/19/2019   Malnutrition of moderate degree 08/25/2017   Tobacco use disorder 08/23/2017   COPD (chronic obstructive pulmonary disease) ([place]) 08/23/2017    Orientation RESPIRATION BLADDER Height & Weight     Self, Time, Situation, Place  Normal  Continent Weight: 159 lb 6.3 oz (72.3 kg) (weighed x 2) Height:  6\' 3"  (190.5 cm)  BEHAVIORAL SYMPTOMS/MOOD NEUROLOGICAL BOWEL NUTRITION STATUS      Continent Diet  AMBULATORY STATUS COMMUNICATION OF NEEDS Skin   Limited Assist Verbally Surgical wounds, Wound Vac (s/p L-TMA w/ prevena wound VAC placed on 10/14/21)                       Personal Care Assistance Level of Assistance  Bathing, Dressing Bathing Assistance: Limited assistance   Dressing Assistance: Limited assistance     Functional Limitations Info             [place]  PT (By licensed PT), OT (By licensed OT)               Contractures Contractures Info: Not present    Additional Factors Info   (TDWB left LE)               Current Medications (10/18/2021):  This is the current hospital active medication list Current Facility-Administered Medications  Medication Dose Route Frequency Provider Last Rate Last Admin   0.9 %  sodium chloride infusion  250 mL Intravenous PRN Newt Minion, MD       acetaminophen (TYLENOL) tablet 325-650 mg  325-650 mg Oral Q6H PRN Newt Minion, MD       acetaminophen (TYLENOL) tablet 650 mg  650 mg Oral Q4H PRN Newt Minion, MD       alum & mag hydroxide-simeth (MAALOX/MYLANTA) 200-200-20 MG/5ML suspension 15 mL  15 mL Oral Q4H PRN Gonfa,  Charlesetta Ivory, MD       ascorbic acid (VITAMIN C) tablet 1,000 mg  1,000 mg Oral Daily Newt Minion, MD   1,000 mg at 10/18/21 3546   aspirin EC tablet 81 mg  81 mg Oral Daily Newt Minion, MD   81 mg at 10/18/21 5681   bisacodyl (DULCOLAX) EC tablet 5 mg  5 mg Oral Daily PRN Newt Minion, MD       clopidogrel (PLAVIX) tablet 75 mg  75 mg Oral Q breakfast Newt Minion, MD   75 mg at 10/18/21 2751   docusate sodium (COLACE) capsule 100 mg  100 mg Oral Daily Newt Minion, MD   100 mg at 10/18/21 7001   feeding supplement (ENSURE ENLIVE / ENSURE PLUS) liquid 237 mL  237 mL Oral BID BM Newt Minion, MD   237 mL  at 74/94/49 6759   folic acid (FOLVITE) tablet 1 mg  1 mg Oral Daily Newt Minion, MD   1 mg at 10/18/21 1638   gabapentin (NEURONTIN) capsule 100 mg  100 mg Oral TID Newt Minion, MD   100 mg at 10/18/21 0924   guaiFENesin-dextromethorphan (ROBITUSSIN DM) 100-10 MG/5ML syrup 15 mL  15 mL Oral Q4H PRN Newt Minion, MD       heparin injection 5,000 Units  5,000 Units Subcutaneous Q8H Newt Minion, MD   5,000 Units at 10/17/21 2219   hydrALAZINE (APRESOLINE) injection 5 mg  5 mg Intravenous Q20 Min PRN Newt Minion, MD       HYDROmorphone (DILAUDID) injection 0.5-1 mg  0.5-1 mg Intravenous Q4H PRN Newt Minion, MD   1 mg at 10/15/21 0148   labetalol (NORMODYNE) injection 10 mg  10 mg Intravenous Q10 min PRN Newt Minion, MD       lacosamide (VIMPAT) tablet 100 mg  100 mg Oral BID Newt Minion, MD   100 mg at 10/18/21 4665   levETIRAcetam (KEPPRA) 100 MG/ML solution 1,500 mg  1,500 mg Oral BID Skeet Simmer, RPH   1,500 mg at 10/18/21 9935   magnesium sulfate IVPB 2 g 50 mL  2 g Intravenous Daily PRN Newt Minion, MD       metoprolol tartrate (LOPRESSOR) injection 2-5 mg  2-5 mg Intravenous Q2H PRN Newt Minion, MD       metoprolol tartrate (LOPRESSOR) tablet 50 mg  50 mg Oral BID Newt Minion, MD   50 mg at 10/18/21 7017   multivitamin with minerals tablet 1 tablet  1 tablet Oral Daily Newt Minion, MD   1 tablet at 10/18/21 7939   nicotine (NICODERM CQ - dosed in mg/24 hours) patch 21 mg  21 mg Transdermal Daily Newt Minion, MD   21 mg at 10/18/21 0300   nutrition supplement (JUVEN) (JUVEN) powder packet 1 packet  1 packet Oral BID BM Newt Minion, MD   1 packet at 10/18/21 0924   ondansetron [facility]) injection 4 mg  4 mg Intravenous Q6H PRN Newt Minion, MD       oxyCODONE (Oxy IR/ROXICODONE) immediate release tablet 10-15 mg  10-15 mg Oral Q4H PRN Newt Minion, MD   10 mg at 10/15/21 0330   oxyCODONE (Oxy IR/ROXICODONE) immediate release tablet 5-10 mg  5-10 mg  Oral Q4H PRN Newt Minion, MD   10 mg at 10/16/21 0433   pantoprazole (PROTONIX) EC tablet 40 mg  40 mg  Oral Daily Newt Minion, MD   40 mg at 10/18/21 5102   PHENobarbital (LUMINAL) tablet 32.4 mg  32.4 mg Oral Daily Newt Minion, MD   32.4 mg at 10/18/21 5852   PHENobarbital (LUMINAL) tablet 64.8 mg  64.8 mg Oral QHS Newt Minion, MD   64.8 mg at 10/17/21 2217   phenol (CHLORASEPTIC) mouth spray 1 spray  1 spray Mouth/Throat PRN Newt Minion, MD       polyethylene glycol (MIRALAX / GLYCOLAX) packet 17 g  17 g Oral Daily PRN Newt Minion, MD       potassium chloride SA (KLOR-CON M) CR tablet 20-40 mEq  20-40 mEq Oral Daily PRN Newt Minion, MD       rosuvastatin (CRESTOR) tablet 20 mg  20 mg Oral Daily Newt Minion, MD   20 mg at 10/18/21 7782   sertraline (ZOLOFT) tablet 25 mg  25 mg Oral Daily Newt Minion, MD   25 mg at 10/18/21 4235   sodium bicarbonate tablet 650 mg  650 mg Oral TID Wendee Beavers T, MD   650 mg at 10/18/21 3614   sodium chloride flush (NS) 0.9 % injection 3 mL  3 mL Intravenous Q12H Newt Minion, MD   3 mL at 10/18/21 0941   sodium chloride flush (NS) 0.9 % injection 3 mL  3 mL Intravenous PRN Newt Minion, MD       thiamine tablet 100 mg  100 mg Oral Daily Newt Minion, MD   100 mg at 10/18/21 4315   zinc sulfate capsule 220 mg  220 mg Oral Daily Newt Minion, MD   220 mg at 10/18/21 4008     Discharge Medications: Please see discharge summary for a list of discharge medications.  Relevant Imaging Results:  Relevant Lab Results:   Additional Information SS# [id number]  Bethann Berkshire, LCSW

## 2021-10-18 NOTE — Progress Notes (Signed)
Attempted to call report to [facility] at [phone] with no answer. Will try again.  Daymon Larsen, RN

## 2021-10-18 NOTE — Discharge Summary (Signed)
Physician Discharge Summary   Patient: Seth [name] MRN: [id number] DOB: 04-Jan-1956  Admit date:     10/10/2021  Discharge date: 10/18/21  Discharge Physician: Mercy Riding   PCP: Lucianne Lei, MD   Recommendations at discharge:   Continue wound VAC until follow-up with orthopedic surgery Follow-up with orthopedic surgery as below Check basic labs including CBC, BMP and magnesium in 1 week Check ESR and CRP in 2 weeks Outpatient follow-up with neurology as previously planned (on 03/02/2022). Outpatient follow-up with cardiology and vascular surgery as previously planned (01/13/2022).  Discharge Diagnoses: Principal Problem:   Acute osteomyelitis of left first metatarsal stump Active Problems:   Peripheral arterial disease ([place])   Fall during current hospitalization   Tobacco use disorder   COPD (chronic obstructive pulmonary disease) (HCC)   Malnutrition of moderate degree   Generalized weakness   History of CVA with residual RUE weakness   Benign essential HTN   Seizures (HCC)   S/P transmetatarsal amputation of foot, left (HCC)  Resolved Problems:   Metabolic acidosis   Hyponatremia   Osteomyelitis ([place])   Foot infection   Hospital Course: 66 year old M with PMH of CVA with residual right hemiparesis, SDH, seizure, COPD, tobacco use disorder, moderate malnutrition HTN, BPH, depression, gout, PAD s/p laser arthrectomy and stent placement to left popliteal artery on 1/24 by Dr. 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  In ED, HDS, afebrile without leukocytosis.  His wound showed exposed bone.  X-ray showed bone destruction at the stump of the first metatarsal concerning for osteomyelitis.  Orthopedic surgery, Dr. Sharol Given consulted.  He was a started on broad-spectrum antibiotics.  Underwent TMA of first and second metatarsal with wound VAC  placement on 2/17.  Broad-spectrum antibiotics discontinued on 2/18.  Orthopedic surgery recommended touchdown weightbearing on left foot, wound VAC for 1 week until outpatient follow-up.  Patient is optimized for discharge to SNF once bed available.  Assessment and Plan: * Acute osteomyelitis of left first metatarsal stump- (present on admission) -Had left TMA by Dr. Sharol Given on 1/26 for osteomyelitis.  CRP 13.7.  ESR 80. -Underwent left TMA of first and second metatarsal on 2/17 with wound VAC placement. -Vanco/CTX and Flagyl discontinued 24 hours postop per Ortho recommendation. -Touchdown weightbearing on the left with postop shoe. -Continue wound VAC for 1 week until follow-up with orthopedic surgery -Outpatient follow-up with orthopedic surgery in 1 week -Tylenol as needed for pain   Fall during current hospitalization Reportedly fell on his backside when he tried to get of the bedside commode by himself.  Was not able to get of the floor.  Denies hitting his head or LOC.  No notable injury or focal neurodeficit. -Fall precaution  Peripheral arterial disease ([place]) -Laser arthrectomy, angioplasty and stent to left PA by Dr. Donzetta Matters on 1/24. -Laser arthrectomy, angioplasty of left ATA, SFA and DES to SFA by Dr. Unk Lightning on 2/15 -On Plavix, aspirin and statin -Encouraged tobacco cessation -Outpatient follow-up with vascular surgery  Seizures ([place]) Last seen by neurology on 09/01/2021. - Continue Keppra, Vimpat and phenobarbital -Discontinue tramadol.  Lowers seizure threshold.   Benign essential HTN- (present on admission) - Continue home Toprol  History of CVA with residual RUE weakness - Plavix, aspirin and statin as above  Generalized weakness - Continue PT/OT  Malnutrition of moderate degree- (present on admission) As evidenced by significant  muscle mass loss Nutrition Problem: Moderate Malnutrition Etiology: chronic illness (PAD) Signs/Symptoms: mild fat depletion, mild  muscle depletion Interventions: Ensure Enlive (each supplement provides 350kcal and 20 grams of protein), Juven, MVI  COPD (chronic obstructive pulmonary disease) ([place])- (present on admission) Stable. -Inhalers/nebs as needed -Encouraged smoking cessation.  Tobacco use disorder- (present on admission) Reports smoking about a pack a day since the age of 89. -Discussed the importance of smoking cessation especially in regards to wound healing and PAD -Nicotine patch  Hyponatremia-resolved as of 10/18/2021 Resolved.  Metabolic acidosis-resolved as of 10/18/2021, (present on admission) Resolved.           Consultants: Orthopedic surgery and vascular surgery Procedures performed: Left TMA on 10/14/2021 Disposition: Skilled nursing facility Diet recommendation:  Discharge Diet Orders (From admission, onward)     Start     Ordered   10/18/21 0000  Diet general        10/18/21 1017           Regular diet  DISCHARGE MEDICATION: Allergies as of 10/18/2021   No Known Allergies      Medication List     STOP taking these medications    multivitamin with minerals Tabs tablet   Spiriva Respimat 2.5 MCG/ACT Aers Generic drug: Tiotropium Bromide Monohydrate   traMADol 50 MG tablet Commonly known as: ULTRAM       TAKE these medications    acetaminophen 325 MG tablet Commonly known as: TYLENOL Take 2 tablets (650 mg total) by mouth every 6 (six) hours as needed for headache or mild pain.   albuterol 108 (90 Base) MCG/ACT inhaler Commonly known as: VENTOLIN HFA Inhale 2 puffs into the lungs every 6 (six) hours as needed for wheezing or shortness of breath.   ascorbic acid 500 MG tablet Commonly known as: VITAMIN C Take 1 tablet (500 mg total) by mouth daily. Start taking on: October 19, 2021   aspirin 81 MG chewable tablet Chew 1 tablet (81 mg total) by mouth daily.   clopidogrel 75 MG tablet Commonly known as: PLAVIX Take 1 tablet (75 mg total) by mouth  daily.   cyanocobalamin 1000 MCG tablet Take 1 tablet (1,000 mcg total) by mouth daily.   feeding supplement Liqd Take 237 mLs by mouth 3 (three) times daily between meals.   ferrous sulfate 325 (65 FE) MG tablet Take 1 tablet (325 mg total) by mouth 2 (two) times daily with a meal. What changed: when to take this   fluticasone-salmeterol 500-50 MCG/ACT Aepb Commonly known as: ADVAIR Inhale 1 puff into the lungs 2 (two) times daily.   folic acid 1 MG tablet Commonly known as: FOLVITE Take 1 tablet (1 mg total) by mouth daily.   gabapentin 100 MG capsule Commonly known as: NEURONTIN Take 100 mg by mouth 3 (three) times daily.   Incruse Ellipta 62.5 MCG/ACT Aepb Generic drug: umeclidinium bromide INHALE 1 PUFF INTO THE LUNGS DAILY   Lacosamide 100 MG Tabs Take 1 tablet (100 mg total) by mouth 2 (two) times daily.   levETIRAcetam 750 MG tablet Commonly known as: KEPPRA Take 2 tablets (1,500 mg total) by mouth 2 (two) times daily.   metoprolol tartrate 50 MG tablet Commonly known as: LOPRESSOR Take 1 tablet (50 mg total) by mouth 2 (two) times daily.   multivitamins ther. w/minerals Tabs tablet Take 1 tablet by mouth daily.   pantoprazole 40 MG tablet Commonly known as: PROTONIX Take 1 tablet (40 mg total) by mouth daily.   PHENobarbital  32.4 MG tablet Commonly known as: LUMINAL Take 1 tablet (32.4 mg total) by mouth every morning AND 2 tablets (64.8 mg total) every evening.   PHENobarbital 32.4 MG tablet Commonly known as: LUMINAL Take 1 tablet (32.4 mg total) by mouth 2 (two) times daily.   polyethylene glycol 17 g packet Commonly known as: MIRALAX / GLYCOLAX Take 17 g by mouth daily as needed for moderate constipation.   rosuvastatin 10 MG tablet Commonly known as: CRESTOR Take 1 tablet (10 mg total) by mouth daily.   senna-docusate 8.6-50 MG tablet Commonly known as: Senokot-S Take 1 tablet by mouth 2 (two) times daily between meals as needed for mild  constipation.   sertraline 25 MG tablet Commonly known as: ZOLOFT Take 25 mg by mouth daily.   thiamine 100 MG tablet Take 1 tablet (100 mg total) by mouth daily.   zinc sulfate 220 (50 Zn) MG capsule Take 1 capsule (220 mg total) by mouth daily. Start taking on: October 19, 2021               Discharge Care Instructions  (From admission, onward)           Start     Ordered   10/18/21 0000  Discharge wound care:       Comments: Continue wound VAC until follow-up with orthopedic surgery   10/18/21 1017            Follow-up Information     [facility] Follow up in 3 month(s).   Specialty: Vascular Surgery Why: Office will call you to arrange your appt (sent) Contact information: [place] [phone]        Newt Minion, MD Follow up in 1 week(s).   Specialty: Orthopedic Surgery Contact information: [place] [address] [phone]                 Discharge Exam: Danley Danker Weights   10/15/21 0131 10/16/21 0005 10/17/21 0345  Weight: 72.3 kg 76.4 kg 72.3 kg   Vitals:   10/17/21 1954 10/18/21 0051 10/18/21 0421 10/18/21 0733  BP: 114/73 113/68 113/74 116/72  Pulse: 63 65 78 73  Resp: '18 18 18 20  ' Temp: 98.9 F (37.2 C) 98.3 F (36.8 C) 98.5 F (36.9 C) 98.1 F (36.7 C)  TempSrc: Oral Oral Oral Oral  SpO2: 96% 97% 99% 98%  Weight:      Height:         Condition at discharge: fair  The results of significant diagnostics from this hospitalization (including imaging, microbiology, ancillary and laboratory) are listed below for reference.   Imaging Studies: DG Chest 1 View  Result Date: 10/10/2021 CLINICAL DATA:  LEFT foot wound EXAM: CHEST  1 VIEW COMPARISON:  05/16/2021 FINDINGS: Normal heart size, mediastinal contours, and pulmonary vascularity. Atherosclerotic calcification aorta. Increased blunting of RIGHT costophrenic angle by effusion.  Remaining lungs clear. No infiltrate or pneumothorax. Bones demineralized. IMPRESSION: Small RIGHT pleural effusion increased from previous exam. Aortic Atherosclerosis (ICD10-I70.0). Electronically Signed   By: Lavonia Dana M.D.   On: 10/10/2021 16:31   PERIPHERAL VASCULAR CATHETERIZATION  Result Date: 10/12/2021 Images from the original result were not included. Patient name: KINSLER SOEDER MRN: [id number] DOB: 08/13/1956 Sex: male 10/12/2021 Pre-operative Diagnosis: Left lower extremity [place] 5 critical limb ischemia Post-operative diagnosis:  Same Surgeon:  Broadus John, MD Procedure Performed: 1.  Ultrasound-guided micropuncture access of the right common femoral artery 2.  Aortogram 3.  Aortic cannulation, left lower extremity angiogram 4.  Laser atherectomy of the left anterior tibial artery using the 0.9 a neuron laser 5.  2 x 232m balloon angioplasty of the left anterior tibial artery 6.  5 x 1574mdrug-coated balloon angioplasty of the superficial femoral artery 7.  6 x 4014mrug-coated stenting of the superficial femoral artery 8.  Device assisted closure -Pro-glide Indications: Patient is a 66 43ar old gentleman with known peripheral arterial disease who has a history of left lower extremity intervention including left popliteal artery stenting, atherectomy, left transmetatarsal amputation.  He presented to the ED with an ulceration on his transmetatarsal amputation stump.  Bone was appreciated in the wound bed.  ABIs demonstrated significant peripheral arterial disease with monophasic waveforms in the dorsalis pedis and posterior tibial artery.  After discussing the risk and benefits of left lower extremity angiography in an effort to define and improve distal perfusion for wound healing, RodMarqusected to proceed. Findings: Aortogram: Patent bilateral renal arteries, normal infrarenal abdominal aorta, no flow-limiting stenosis within the iliac system bilaterally On the right: Normal common  femoral artery normal profunda.  The superficial femoral artery demonstrated greater than 50% stenosis at its distal aspect prior to the previously placed above-knee popliteal artery stent.  The popliteal artery demonstrated severe disease with an area of focal stenosis less than 50%.  The anterior tibial artery was patent to the mid tibia with short segment occlusion.  It was underfilled but ran off into the foot via the dorsalis pedis artery.  The peroneal artery was occluded 2 cm from its ostia, with reconstitution at the ankle through collaterals.  The posterior tibial artery was occluded at its ostia.  Procedure:  The patient was identified in the holding area and taken to room 8.  The patient was then placed supine on the table and prepped and draped in the usual sterile fashion.  A time out was called.  Ultrasound was used to evaluate the right common femoral artery.  It was patent .  A digital ultrasound image was acquired.  A micropuncture needle was used to access the right common femoral artery under ultrasound guidance.  An 018 wire was advanced without resistance and a micropuncture sheath was placed.  The 018 wire was removed and a benson wire was placed.  The micropuncture sheath was exchanged for a 5 french sheath.  An omniflush catheter was advanced over the wire to the level of L-1.  An abdominal angiogram was obtained.  Next, using the omniflush catheter and a benson wire, the aortic bifurcation was crossed and the catheter was placed into theleft external iliac artery and left runoff was obtained. I made the decision to intervene on the anterior tibial artery.  The patient was heparinized and a 6 x 65 cm catheter was positioned in the left superficial femoral artery.  Using a series of 014 wires and catheters, the anterior tibial artery was traversed and the wire was positioned in the distal dorsalis pedis artery.  Next, due to the small size of the artery, I elected to use the [place] 0.9mm78mser  to improve luminal gain prior to balloon angioplasty.  Following laser atherectomy, a 2 x 200 mm balloon was inflated for 3 minutes along the length of the anterior tibial artery.  Postangioplasty completion angiography demonstrated an excellent result with inline flow to the foot.  The artery remained small, and demonstrated significant disease, however there was inline flow to the foot.  Next, my attention  turned to the superficial femoral artery and popliteal artery.  The popliteal artery lesion was measured at less than 50% and did not appear flow-limiting, therefore I elected to leave this area as I was worried that balloon angioplasty could lead to dissection.  I did not want to stent this area.  In the distal superficial femoral artery a 5 x 150 mm drug-coated Ranger balloon was used.  This was inflated for 3 minutes.  Follow-up angiography demonstrated resolution of the flow-limiting stenosis, however a dissection was appreciated at the proximal aspect of balloon inflation.  I elected to stent this area with a 6 x 40 mm Eluvia stent and chose not to post-dilate as the artery was fragile, and I did not want to cause another dissection.  Post stenting angiography demonstrated excellent result with resolution of the dissection. Impression: Single-vessel inline flow to the foot via the anterior tibial artery.  Resolution of flow-limiting stenosis in the superficial femoral artery using a 6 x 40 Eluvia stent, resolution of anterior tibial artery occlusion using laser atherectomy, 2x256m balloon angioplasty. I did not treat the less than 50% stenosis appreciated in the popliteal artery. Patient will need TMA debridement with Dr. DSharol Given orthopedic surgery.  I will call and inform him of these results. JCassandria Santee MD [facility]: [phone]  DG Foot Complete Left  Result Date: 10/10/2021 CLINICAL DATA:  Purulent drainage LEFT foot wound EXAM: LEFT FOOT - COMPLETE 3+  VIEW COMPARISON:  09/14/2020 FINDINGS: Osseous demineralization. Interval transmetatarsal amputation. Stumps of the second through fifth metatarsals are grossly unremarkable. Bone destruction identified at first metatarsal stump highly concerning for osteomyelitis. No acute fracture, dislocation, or additional bone destruction. Regional soft tissue swelling. Small vessel vascular calcifications at ankle into foot. IMPRESSION: Interval transmetatarsal amputation with bone destruction at the stump of the first metatarsal highly suspicious for osteomyelitis. Electronically Signed   By: MLavonia DanaM.D.   On: 10/10/2021 16:30   VAS [place]ABI WITH/WO TBI  Result Date: 10/11/2021  LOWER EXTREMITY DOPPLER STUDY Patient Name:  RARMSTRONG CREASY Date of Exam:   10/11/2021 Medical Rec #: [id number]       Accession #:    [id number] of Birth: [birth year]        Patient Gender: M Patient Age:   89years [facility]      [facility] [place]I WITH/WO TBI Referring Phys: CHING TU --------------------------------------------------------------------------------  Indications: Peripheral artery disease. High Risk Factors: Hypertension, current smoker, prior CVA. Other Factors: 09-22-2020 Left transmetatarsal amputation.  Vascular Interventions: 09-20-2020 Laser atherectomy and stent of left above                         knee pop artery. Comparison Study: 08-24-2021 Prior LEA duplex and ABI studies showing 50-74% LT                   proximal popliteal stenosis, patent stent. RT ABI 0.74/0.36 LT                   ABI 0.45/transmetatarsal amputation. Performing Technologist: HDarlin CocoRDMS RVT  Examination Guidelines: A complete evaluation includes at minimum, Doppler waveform signals and systolic blood pressure reading at the level of bilateral brachial, anterior tibial, and posterior tibial arteries, when vessel segments are accessible. Bilateral testing is considered an integral part of a complete  examination. Photoelectric Plethysmograph (PPG) waveforms  and toe systolic pressure readings are included as required and additional duplex testing as needed. Limited examinations for reoccurring indications may be performed as noted.  ABI Findings: +---------+------------------+-----+----------+--------+  Right     Rt Pressure (mmHg) Index Waveform   Comment   +---------+------------------+-----+----------+--------+  Brachial  148                      triphasic            +---------+------------------+-----+----------+--------+  PTA       128                0.86  monophasic           +---------+------------------+-----+----------+--------+  DP        116                0.78  monophasic           +---------+------------------+-----+----------+--------+  Great Toe 43                 0.29  Abnormal             +---------+------------------+-----+----------+--------+ +---------+------------------+-----+----------+--------------------------+  Left      Lt Pressure (mmHg) Index Waveform   Comment                     +---------+------------------+-----+----------+--------------------------+  Brachial  139                      triphasic                              +---------+------------------+-----+----------+--------------------------+  PTA       69                 0.47  monophasic                             +---------+------------------+-----+----------+--------------------------+  DP        84                 0.57  monophasic                             +---------+------------------+-----+----------+--------------------------+  Great Toe                                     Transmetatarsal amputation  +---------+------------------+-----+----------+--------------------------+ +-------+-----------+-----------+------------+------------+  ABI/TBI Today's ABI Today's TBI Previous ABI Previous TBI  +-------+-----------+-----------+------------+------------+  Right   0.86        0.29        0.74         0.36           +-------+-----------+-----------+------------+------------+  Left    0.57        Amputation  0.45                       +-------+-----------+-----------+------------+------------+ Arterial wall calcification precludes accurate ankle pressures and ABIs.  Summary: Right: Resting right ankle-brachial index indicates mild right lower extremity arterial disease; however, pressures may be falsely elevated secondary to arterial calcification. The right toe-brachial index is abnormal.  Left: Resting left ankle-brachial index indicates moderate left lower extremity arterial disease; however, pressures may be falsely elevated secondary  to arterial calcification.  *See table(s) above for measurements and observations.  Electronically signed by [name] MD on 10/11/2021 at 1:54:03 PM.    Final     Microbiology: Results for orders placed or performed during the hospital encounter of 10/10/21  Urine Culture     Status: Abnormal   Collection Time: 10/10/21  3:36 PM   Specimen: In/Out Cath Urine  Result Value Ref Range Status   Specimen Description IN/OUT CATH URINE  Final   Special Requests NONE  Final   Culture (A)  Final    10,000 COLONIES/mL DIPHTHEROIDS(CORYNEBACTERIUM SPECIES) Standardized susceptibility testing for this organism is not available. Performed at [facility], [place] [address], [place], [place] [address]    Report Status 10/12/2021 FINAL  Final  Blood Culture (routine x 2)     Status: None   Collection Time: 10/10/21  3:41 PM   Specimen: BLOOD  Result Value Ref Range Status   Specimen Description BLOOD BLOOD RIGHT HAND  Final   Special Requests   Final    BOTTLES DRAWN AEROBIC AND ANAEROBIC Blood Culture results may not be optimal due to an inadequate volume of blood received in culture bottles   Culture   Final    NO GROWTH 5 DAYS Performed at [facility], [place] [address], [place], [place] [address]    Report Status 10/15/2021 FINAL  Final  Blood Culture (routine  x 2)     Status: None   Collection Time: 10/10/21  3:45 PM   Specimen: BLOOD  Result Value Ref Range Status   Specimen Description BLOOD BLOOD RIGHT HAND  Final   Special Requests   Final    BOTTLES DRAWN AEROBIC AND ANAEROBIC Blood Culture adequate volume   Culture   Final    NO GROWTH 5 DAYS Performed at [facility], [place] [address], [place], [place] [address]    Report Status 10/15/2021 FINAL  Final  Resp Panel by RT-PCR (Flu A&B, Covid) Nasopharyngeal Swab     Status: None   Collection Time: 10/10/21  6:37 PM   Specimen: Nasopharyngeal Swab; Nasopharyngeal(NP) swabs in vial transport medium  Result Value Ref Range Status   SARS Coronavirus 2 by RT PCR NEGATIVE NEGATIVE Final    Comment: (NOTE) SARS-CoV-2 target nucleic acids are NOT DETECTED.  The SARS-CoV-2 RNA is generally detectable in upper respiratory specimens during the acute phase of infection. The lowest concentration of SARS-CoV-2 viral copies this assay can detect is 138 copies/mL. A negative result does not preclude SARS-Cov-2 infection and should not be used as the sole basis for treatment or other patient management decisions. A negative result may occur with  improper specimen collection/handling, submission of specimen other than nasopharyngeal swab, presence of viral mutation(s) within the areas targeted by this assay, and inadequate number of viral copies(<138 copies/mL). A negative result must be combined with clinical observations, patient history, and epidemiological information. The expected result is Negative.  Fact Sheet for Patients:  EntrepreneurPulse.com.au  Fact Sheet for Healthcare Providers:  IncredibleEmployment.be  This test is no t yet approved or cleared by the [place] FDA and  has been authorized for detection and/or diagnosis of SARS-CoV-2 by FDA under an Emergency Use Authorization (EUA). This EUA will remain  in effect (meaning this test  can be used) for the duration of the COVID-19 declaration under Section 564(b)(1) of the Act, 21 U.S.C.section 360bbb-3(b)(1), unless the authorization is terminated  or revoked sooner.  Influenza A by PCR NEGATIVE NEGATIVE Final   Influenza B by PCR NEGATIVE NEGATIVE Final    Comment: (NOTE) The Xpert Xpress SARS-CoV-2/FLU/RSV plus assay is intended as an aid in the diagnosis of influenza from Nasopharyngeal swab specimens and should not be used as a sole basis for treatment. Nasal washings and aspirates are unacceptable for Xpert Xpress SARS-CoV-2/FLU/RSV testing.  Fact Sheet for Patients: EntrepreneurPulse.com.au  Fact Sheet for Healthcare Providers: IncredibleEmployment.be  This test is not yet approved or cleared by the [place] FDA and has been authorized for detection and/or diagnosis of SARS-CoV-2 by FDA under an Emergency Use Authorization (EUA). This EUA will remain in effect (meaning this test can be used) for the duration of the COVID-19 declaration under Section 564(b)(1) of the Act, 21 U.S.C. section 360bbb-3(b)(1), unless the authorization is terminated or revoked.  Performed at [facility], [place] [address] [place], [place] [address]   Surgical pcr screen     Status: None   Collection Time: 10/13/21  2:45 PM   Specimen: Nasal Mucosa; Nasal Swab  Result Value Ref Range Status   MRSA, PCR NEGATIVE NEGATIVE Final   Staphylococcus aureus NEGATIVE NEGATIVE Final    Comment: (NOTE) The Xpert SA Assay (FDA approved for NASAL specimens in patients 19 years of age and older), is one component of a comprehensive surveillance program. It is not intended to diagnose infection nor to guide or monitor treatment. Performed at [facility], [place] [address] [place], [place] [address]     Labs: CBC: Recent Labs  Lab 10/12/21 0402 10/13/21 0058 10/14/21 7034 10/16/21 0113 10/18/21 0724  WBC 6.9 7.2 6.8 7.8  6.4  NEUTROABS 4.2 4.8 4.0  --   --   HGB 10.0* 10.3* 9.7* 9.1* 9.3*  HCT 31.5* 31.8* 31.1* 29.9* 29.5*  MCV 78.4* 78.7* 79.3* 79.7* 78.5*  PLT 520* 497* 467* 444* 035*   Basic Metabolic Panel: Recent Labs  Lab 10/13/21 0058 10/14/21 0613 10/15/21 0206 10/16/21 0113 10/18/21 0724  NA 133* 136 134* 136 136  K 4.3 4.1 4.1 4.0 4.2  CL 103 105 105 105 104  CO2 19* 19* 17* 21* 24  GLUCOSE 82 84 98 97 92  BUN '10 12 11 16 16  ' CREATININE 0.92 0.95 1.01 0.82 0.84  CALCIUM 8.9 9.2 9.0 8.9 9.1  MG  --   --  1.7 1.8 1.9  PHOS  --   --  3.6 2.7 3.6   Liver Function Tests: Recent Labs  Lab 10/15/21 0206 10/16/21 0113 10/18/21 0724  ALBUMIN 2.8* 2.7* 2.7*   CBG: Recent Labs  Lab 10/15/21 2107  GLUCAP 100*    Discharge time spent: greater than 30 minutes.  Signed: Mercy Riding, MD [facility] 10/18/2021

## 2021-10-18 NOTE — Progress Notes (Signed)
Mobility Specialist Progress Note   10/18/21 1100  Mobility  Activity Ambulated with assistance in room;Ambulated with assistance in hallway;Transferred from chair to bed  Level of Assistance Contact guard assist, steadying assist  Assistive Device Front wheel [place]  LLE Weight Bearing TWB  Distance Ambulated (ft) 76 ft  Activity Response Tolerated well  $Mobility charge 1 Mobility   Pt Sitiing comfortably in chair having no complaints and agreeable. No physical assist needed throughout session and pt demonstrating decent heal strike w/ TWB precaution. Returned back to bed w/ no complaints, bed alarm on and call bell by side.   Holland Falling Mobility Specialist Phone Number [phone]

## 2021-10-18 NOTE — TOC Transition Note (Signed)
Transition of Care [facility]) - CM/SW Discharge Note   Patient Details  Name: Seth [name] MRN: [id number] Date of Birth: [birth year]  Transition of Care [facility] CM/SW Contact:  Bethann Berkshire, [place] Phone Number: 10/18/2021, 11:34 AM   Clinical Narrative:     Patient will DC to: [facility] Anticipated DC date: 10/18/2021 Family notified:  [facility] Sister [phone]     Transport by: Corey Harold   Per MD patient ready for DC to [facility]. RN, patient, patient's family, and facility notified of DC. Discharge Summary and FL2 sent to facility. RN to call report prior to discharge ([phone] Room 145). DC packet on chart. Ambulance transport requested for patient.   CSW will sign off for now as social work intervention is no longer needed. Please consult [place] again if new needs arise.   Final next level of care: Skilled Nursing Facility Barriers to Discharge: No Barriers Identified   Patient Goals and CMS Choice        Discharge Placement              Patient chooses bed at:  ([facility]) Patient to be transferred to facility by: [place] Name of family member notified: Gov[place], [place] [phone] Patient and family notified of of transfer: 10/18/21  Discharge Plan and Services In-house Referral: Clinical Social Work                                   Social Determinants of Health ([place]) Interventions     Readmission Risk Interventions Readmission Risk Prevention Plan 06/04/2019  Transportation Screening Complete  PCP or Specialist Appt within 5-7 Days Complete  Home Care Screening Complete  Medication Review (RN CM) Complete  Some recent data might be hidden

## 2021-10-19 DIAGNOSIS — Z89439 Acquired absence of unspecified foot: Secondary | ICD-10-CM | POA: Diagnosis not present

## 2021-10-19 DIAGNOSIS — R5381 Other malaise: Secondary | ICD-10-CM | POA: Diagnosis not present

## 2021-10-19 DIAGNOSIS — M869 Osteomyelitis, unspecified: Secondary | ICD-10-CM | POA: Diagnosis not present

## 2021-10-25 ENCOUNTER — Telehealth: Payer: Self-pay | Admitting: Orthopedic Surgery

## 2021-10-25 DIAGNOSIS — R531 Weakness: Secondary | ICD-10-CM | POA: Diagnosis not present

## 2021-10-25 DIAGNOSIS — M869 Osteomyelitis, unspecified: Secondary | ICD-10-CM | POA: Diagnosis not present

## 2021-10-25 DIAGNOSIS — U071 COVID-19: Secondary | ICD-10-CM | POA: Diagnosis not present

## 2021-10-25 NOTE — Telephone Encounter (Signed)
Nurse at [facility] called stating that pt had surgery one left foot 1st metatarsal amputation on 10/14/21. His wound vac is loose and not suctioning and she asked if they could remove this. He has an upcoming apt this week. I did give her the okay to remove the wound vac. Since is has been on there greater than 10 days.

## 2021-10-28 DIAGNOSIS — H903 Sensorineural hearing loss, bilateral: Secondary | ICD-10-CM | POA: Diagnosis not present

## 2021-11-02 DIAGNOSIS — H5213 Myopia, bilateral: Secondary | ICD-10-CM | POA: Diagnosis not present

## 2021-11-02 DIAGNOSIS — H2513 Age-related nuclear cataract, bilateral: Secondary | ICD-10-CM | POA: Diagnosis not present

## 2021-11-02 DIAGNOSIS — H40013 Open angle with borderline findings, low risk, bilateral: Secondary | ICD-10-CM | POA: Diagnosis not present

## 2021-11-02 DIAGNOSIS — H18413 Arcus senilis, bilateral: Secondary | ICD-10-CM | POA: Diagnosis not present

## 2021-11-05 DIAGNOSIS — A419 Sepsis, unspecified organism: Secondary | ICD-10-CM | POA: Diagnosis not present

## 2021-11-05 DIAGNOSIS — M86272 Subacute osteomyelitis, left ankle and foot: Secondary | ICD-10-CM | POA: Diagnosis not present

## 2021-11-05 DIAGNOSIS — M6281 Muscle weakness (generalized): Secondary | ICD-10-CM | POA: Diagnosis not present

## 2021-11-05 DIAGNOSIS — I69351 Hemiplegia and hemiparesis following cerebral infarction affecting right dominant side: Secondary | ICD-10-CM | POA: Diagnosis not present

## 2021-11-05 DIAGNOSIS — R262 Difficulty in walking, not elsewhere classified: Secondary | ICD-10-CM | POA: Diagnosis not present

## 2021-11-05 DIAGNOSIS — R2689 Other abnormalities of gait and mobility: Secondary | ICD-10-CM | POA: Diagnosis not present

## 2021-11-05 DIAGNOSIS — I739 Peripheral vascular disease, unspecified: Secondary | ICD-10-CM | POA: Diagnosis not present

## 2021-11-05 DIAGNOSIS — R279 Unspecified lack of coordination: Secondary | ICD-10-CM | POA: Diagnosis not present

## 2021-11-06 DIAGNOSIS — R279 Unspecified lack of coordination: Secondary | ICD-10-CM | POA: Diagnosis not present

## 2021-11-06 DIAGNOSIS — A419 Sepsis, unspecified organism: Secondary | ICD-10-CM | POA: Diagnosis not present

## 2021-11-06 DIAGNOSIS — M6281 Muscle weakness (generalized): Secondary | ICD-10-CM | POA: Diagnosis not present

## 2021-11-06 DIAGNOSIS — I739 Peripheral vascular disease, unspecified: Secondary | ICD-10-CM | POA: Diagnosis not present

## 2021-11-06 DIAGNOSIS — R2689 Other abnormalities of gait and mobility: Secondary | ICD-10-CM | POA: Diagnosis not present

## 2021-11-06 DIAGNOSIS — I69351 Hemiplegia and hemiparesis following cerebral infarction affecting right dominant side: Secondary | ICD-10-CM | POA: Diagnosis not present

## 2021-11-06 DIAGNOSIS — R262 Difficulty in walking, not elsewhere classified: Secondary | ICD-10-CM | POA: Diagnosis not present

## 2021-11-06 DIAGNOSIS — M86272 Subacute osteomyelitis, left ankle and foot: Secondary | ICD-10-CM | POA: Diagnosis not present

## 2021-11-08 DIAGNOSIS — I739 Peripheral vascular disease, unspecified: Secondary | ICD-10-CM | POA: Diagnosis not present

## 2021-11-08 DIAGNOSIS — I69351 Hemiplegia and hemiparesis following cerebral infarction affecting right dominant side: Secondary | ICD-10-CM | POA: Diagnosis not present

## 2021-11-08 DIAGNOSIS — R2689 Other abnormalities of gait and mobility: Secondary | ICD-10-CM | POA: Diagnosis not present

## 2021-11-08 DIAGNOSIS — R279 Unspecified lack of coordination: Secondary | ICD-10-CM | POA: Diagnosis not present

## 2021-11-08 DIAGNOSIS — R262 Difficulty in walking, not elsewhere classified: Secondary | ICD-10-CM | POA: Diagnosis not present

## 2021-11-08 DIAGNOSIS — M6281 Muscle weakness (generalized): Secondary | ICD-10-CM | POA: Diagnosis not present

## 2021-11-08 DIAGNOSIS — M86272 Subacute osteomyelitis, left ankle and foot: Secondary | ICD-10-CM | POA: Diagnosis not present

## 2021-11-08 DIAGNOSIS — A419 Sepsis, unspecified organism: Secondary | ICD-10-CM | POA: Diagnosis not present

## 2021-11-09 DIAGNOSIS — R262 Difficulty in walking, not elsewhere classified: Secondary | ICD-10-CM | POA: Diagnosis not present

## 2021-11-09 DIAGNOSIS — M86272 Subacute osteomyelitis, left ankle and foot: Secondary | ICD-10-CM | POA: Diagnosis not present

## 2021-11-09 DIAGNOSIS — R2689 Other abnormalities of gait and mobility: Secondary | ICD-10-CM | POA: Diagnosis not present

## 2021-11-09 DIAGNOSIS — I69351 Hemiplegia and hemiparesis following cerebral infarction affecting right dominant side: Secondary | ICD-10-CM | POA: Diagnosis not present

## 2021-11-09 DIAGNOSIS — I739 Peripheral vascular disease, unspecified: Secondary | ICD-10-CM | POA: Diagnosis not present

## 2021-11-09 DIAGNOSIS — R279 Unspecified lack of coordination: Secondary | ICD-10-CM | POA: Diagnosis not present

## 2021-11-09 DIAGNOSIS — M6281 Muscle weakness (generalized): Secondary | ICD-10-CM | POA: Diagnosis not present

## 2021-11-09 DIAGNOSIS — A419 Sepsis, unspecified organism: Secondary | ICD-10-CM | POA: Diagnosis not present

## 2021-11-10 DIAGNOSIS — R262 Difficulty in walking, not elsewhere classified: Secondary | ICD-10-CM | POA: Diagnosis not present

## 2021-11-10 DIAGNOSIS — I69351 Hemiplegia and hemiparesis following cerebral infarction affecting right dominant side: Secondary | ICD-10-CM | POA: Diagnosis not present

## 2021-11-10 DIAGNOSIS — R2689 Other abnormalities of gait and mobility: Secondary | ICD-10-CM | POA: Diagnosis not present

## 2021-11-10 DIAGNOSIS — M86272 Subacute osteomyelitis, left ankle and foot: Secondary | ICD-10-CM | POA: Diagnosis not present

## 2021-11-10 DIAGNOSIS — M6281 Muscle weakness (generalized): Secondary | ICD-10-CM | POA: Diagnosis not present

## 2021-11-10 DIAGNOSIS — R279 Unspecified lack of coordination: Secondary | ICD-10-CM | POA: Diagnosis not present

## 2021-11-10 DIAGNOSIS — A419 Sepsis, unspecified organism: Secondary | ICD-10-CM | POA: Diagnosis not present

## 2021-11-10 DIAGNOSIS — I739 Peripheral vascular disease, unspecified: Secondary | ICD-10-CM | POA: Diagnosis not present

## 2021-11-11 DIAGNOSIS — M6281 Muscle weakness (generalized): Secondary | ICD-10-CM | POA: Diagnosis not present

## 2021-11-11 DIAGNOSIS — R2689 Other abnormalities of gait and mobility: Secondary | ICD-10-CM | POA: Diagnosis not present

## 2021-11-11 DIAGNOSIS — R279 Unspecified lack of coordination: Secondary | ICD-10-CM | POA: Diagnosis not present

## 2021-11-11 DIAGNOSIS — I739 Peripheral vascular disease, unspecified: Secondary | ICD-10-CM | POA: Diagnosis not present

## 2021-11-11 DIAGNOSIS — I69351 Hemiplegia and hemiparesis following cerebral infarction affecting right dominant side: Secondary | ICD-10-CM | POA: Diagnosis not present

## 2021-11-11 DIAGNOSIS — M86272 Subacute osteomyelitis, left ankle and foot: Secondary | ICD-10-CM | POA: Diagnosis not present

## 2021-11-11 DIAGNOSIS — R262 Difficulty in walking, not elsewhere classified: Secondary | ICD-10-CM | POA: Diagnosis not present

## 2021-11-11 DIAGNOSIS — A419 Sepsis, unspecified organism: Secondary | ICD-10-CM | POA: Diagnosis not present

## 2021-11-12 DIAGNOSIS — R2689 Other abnormalities of gait and mobility: Secondary | ICD-10-CM | POA: Diagnosis not present

## 2021-11-12 DIAGNOSIS — I69351 Hemiplegia and hemiparesis following cerebral infarction affecting right dominant side: Secondary | ICD-10-CM | POA: Diagnosis not present

## 2021-11-12 DIAGNOSIS — R262 Difficulty in walking, not elsewhere classified: Secondary | ICD-10-CM | POA: Diagnosis not present

## 2021-11-12 DIAGNOSIS — M86272 Subacute osteomyelitis, left ankle and foot: Secondary | ICD-10-CM | POA: Diagnosis not present

## 2021-11-12 DIAGNOSIS — I739 Peripheral vascular disease, unspecified: Secondary | ICD-10-CM | POA: Diagnosis not present

## 2021-11-12 DIAGNOSIS — A419 Sepsis, unspecified organism: Secondary | ICD-10-CM | POA: Diagnosis not present

## 2021-11-12 DIAGNOSIS — R279 Unspecified lack of coordination: Secondary | ICD-10-CM | POA: Diagnosis not present

## 2021-11-12 DIAGNOSIS — M6281 Muscle weakness (generalized): Secondary | ICD-10-CM | POA: Diagnosis not present

## 2021-11-13 DIAGNOSIS — M6281 Muscle weakness (generalized): Secondary | ICD-10-CM | POA: Diagnosis not present

## 2021-11-13 DIAGNOSIS — R262 Difficulty in walking, not elsewhere classified: Secondary | ICD-10-CM | POA: Diagnosis not present

## 2021-11-13 DIAGNOSIS — I739 Peripheral vascular disease, unspecified: Secondary | ICD-10-CM | POA: Diagnosis not present

## 2021-11-13 DIAGNOSIS — M86272 Subacute osteomyelitis, left ankle and foot: Secondary | ICD-10-CM | POA: Diagnosis not present

## 2021-11-13 DIAGNOSIS — A419 Sepsis, unspecified organism: Secondary | ICD-10-CM | POA: Diagnosis not present

## 2021-11-13 DIAGNOSIS — I69351 Hemiplegia and hemiparesis following cerebral infarction affecting right dominant side: Secondary | ICD-10-CM | POA: Diagnosis not present

## 2021-11-13 DIAGNOSIS — R279 Unspecified lack of coordination: Secondary | ICD-10-CM | POA: Diagnosis not present

## 2021-11-13 DIAGNOSIS — R2689 Other abnormalities of gait and mobility: Secondary | ICD-10-CM | POA: Diagnosis not present

## 2021-11-14 DIAGNOSIS — R279 Unspecified lack of coordination: Secondary | ICD-10-CM | POA: Diagnosis not present

## 2021-11-14 DIAGNOSIS — R5381 Other malaise: Secondary | ICD-10-CM | POA: Diagnosis not present

## 2021-11-14 DIAGNOSIS — R2689 Other abnormalities of gait and mobility: Secondary | ICD-10-CM | POA: Diagnosis not present

## 2021-11-14 DIAGNOSIS — I69351 Hemiplegia and hemiparesis following cerebral infarction affecting right dominant side: Secondary | ICD-10-CM | POA: Diagnosis not present

## 2021-11-14 DIAGNOSIS — Z89439 Acquired absence of unspecified foot: Secondary | ICD-10-CM | POA: Diagnosis not present

## 2021-11-14 DIAGNOSIS — R262 Difficulty in walking, not elsewhere classified: Secondary | ICD-10-CM | POA: Diagnosis not present

## 2021-11-14 DIAGNOSIS — I739 Peripheral vascular disease, unspecified: Secondary | ICD-10-CM | POA: Diagnosis not present

## 2021-11-14 DIAGNOSIS — M6281 Muscle weakness (generalized): Secondary | ICD-10-CM | POA: Diagnosis not present

## 2021-11-14 DIAGNOSIS — M86272 Subacute osteomyelitis, left ankle and foot: Secondary | ICD-10-CM | POA: Diagnosis not present

## 2021-11-14 DIAGNOSIS — A419 Sepsis, unspecified organism: Secondary | ICD-10-CM | POA: Diagnosis not present

## 2021-11-15 DIAGNOSIS — R262 Difficulty in walking, not elsewhere classified: Secondary | ICD-10-CM | POA: Diagnosis not present

## 2021-11-15 DIAGNOSIS — A419 Sepsis, unspecified organism: Secondary | ICD-10-CM | POA: Diagnosis not present

## 2021-11-15 DIAGNOSIS — M6281 Muscle weakness (generalized): Secondary | ICD-10-CM | POA: Diagnosis not present

## 2021-11-15 DIAGNOSIS — M86272 Subacute osteomyelitis, left ankle and foot: Secondary | ICD-10-CM | POA: Diagnosis not present

## 2021-11-15 DIAGNOSIS — R279 Unspecified lack of coordination: Secondary | ICD-10-CM | POA: Diagnosis not present

## 2021-11-15 DIAGNOSIS — I739 Peripheral vascular disease, unspecified: Secondary | ICD-10-CM | POA: Diagnosis not present

## 2021-11-15 DIAGNOSIS — R2689 Other abnormalities of gait and mobility: Secondary | ICD-10-CM | POA: Diagnosis not present

## 2021-11-15 DIAGNOSIS — I69351 Hemiplegia and hemiparesis following cerebral infarction affecting right dominant side: Secondary | ICD-10-CM | POA: Diagnosis not present

## 2021-11-16 DIAGNOSIS — A419 Sepsis, unspecified organism: Secondary | ICD-10-CM | POA: Diagnosis not present

## 2021-11-16 DIAGNOSIS — M6281 Muscle weakness (generalized): Secondary | ICD-10-CM | POA: Diagnosis not present

## 2021-11-16 DIAGNOSIS — I69351 Hemiplegia and hemiparesis following cerebral infarction affecting right dominant side: Secondary | ICD-10-CM | POA: Diagnosis not present

## 2021-11-16 DIAGNOSIS — R2689 Other abnormalities of gait and mobility: Secondary | ICD-10-CM | POA: Diagnosis not present

## 2021-11-16 DIAGNOSIS — R262 Difficulty in walking, not elsewhere classified: Secondary | ICD-10-CM | POA: Diagnosis not present

## 2021-11-16 DIAGNOSIS — I739 Peripheral vascular disease, unspecified: Secondary | ICD-10-CM | POA: Diagnosis not present

## 2021-11-16 DIAGNOSIS — R279 Unspecified lack of coordination: Secondary | ICD-10-CM | POA: Diagnosis not present

## 2021-11-16 DIAGNOSIS — M86272 Subacute osteomyelitis, left ankle and foot: Secondary | ICD-10-CM | POA: Diagnosis not present

## 2021-11-17 DIAGNOSIS — M6281 Muscle weakness (generalized): Secondary | ICD-10-CM | POA: Diagnosis not present

## 2021-11-17 DIAGNOSIS — R279 Unspecified lack of coordination: Secondary | ICD-10-CM | POA: Diagnosis not present

## 2021-11-17 DIAGNOSIS — A419 Sepsis, unspecified organism: Secondary | ICD-10-CM | POA: Diagnosis not present

## 2021-11-17 DIAGNOSIS — R2689 Other abnormalities of gait and mobility: Secondary | ICD-10-CM | POA: Diagnosis not present

## 2021-11-17 DIAGNOSIS — M86272 Subacute osteomyelitis, left ankle and foot: Secondary | ICD-10-CM | POA: Diagnosis not present

## 2021-11-17 DIAGNOSIS — I69351 Hemiplegia and hemiparesis following cerebral infarction affecting right dominant side: Secondary | ICD-10-CM | POA: Diagnosis not present

## 2021-11-17 DIAGNOSIS — I739 Peripheral vascular disease, unspecified: Secondary | ICD-10-CM | POA: Diagnosis not present

## 2021-11-17 DIAGNOSIS — R262 Difficulty in walking, not elsewhere classified: Secondary | ICD-10-CM | POA: Diagnosis not present

## 2021-11-18 DIAGNOSIS — I69351 Hemiplegia and hemiparesis following cerebral infarction affecting right dominant side: Secondary | ICD-10-CM | POA: Diagnosis not present

## 2021-11-18 DIAGNOSIS — R279 Unspecified lack of coordination: Secondary | ICD-10-CM | POA: Diagnosis not present

## 2021-11-18 DIAGNOSIS — R2689 Other abnormalities of gait and mobility: Secondary | ICD-10-CM | POA: Diagnosis not present

## 2021-11-18 DIAGNOSIS — M86272 Subacute osteomyelitis, left ankle and foot: Secondary | ICD-10-CM | POA: Diagnosis not present

## 2021-11-18 DIAGNOSIS — I739 Peripheral vascular disease, unspecified: Secondary | ICD-10-CM | POA: Diagnosis not present

## 2021-11-18 DIAGNOSIS — R262 Difficulty in walking, not elsewhere classified: Secondary | ICD-10-CM | POA: Diagnosis not present

## 2021-11-18 DIAGNOSIS — M6281 Muscle weakness (generalized): Secondary | ICD-10-CM | POA: Diagnosis not present

## 2021-11-18 DIAGNOSIS — A419 Sepsis, unspecified organism: Secondary | ICD-10-CM | POA: Diagnosis not present

## 2021-11-19 DIAGNOSIS — R2689 Other abnormalities of gait and mobility: Secondary | ICD-10-CM | POA: Diagnosis not present

## 2021-11-19 DIAGNOSIS — M6281 Muscle weakness (generalized): Secondary | ICD-10-CM | POA: Diagnosis not present

## 2021-11-19 DIAGNOSIS — R262 Difficulty in walking, not elsewhere classified: Secondary | ICD-10-CM | POA: Diagnosis not present

## 2021-11-19 DIAGNOSIS — I69351 Hemiplegia and hemiparesis following cerebral infarction affecting right dominant side: Secondary | ICD-10-CM | POA: Diagnosis not present

## 2021-11-19 DIAGNOSIS — A419 Sepsis, unspecified organism: Secondary | ICD-10-CM | POA: Diagnosis not present

## 2021-11-19 DIAGNOSIS — M86272 Subacute osteomyelitis, left ankle and foot: Secondary | ICD-10-CM | POA: Diagnosis not present

## 2021-11-19 DIAGNOSIS — I739 Peripheral vascular disease, unspecified: Secondary | ICD-10-CM | POA: Diagnosis not present

## 2021-11-19 DIAGNOSIS — R279 Unspecified lack of coordination: Secondary | ICD-10-CM | POA: Diagnosis not present

## 2021-11-21 DIAGNOSIS — Z20822 Contact with and (suspected) exposure to covid-19: Secondary | ICD-10-CM | POA: Diagnosis not present

## 2021-11-21 DIAGNOSIS — J449 Chronic obstructive pulmonary disease, unspecified: Secondary | ICD-10-CM | POA: Diagnosis not present

## 2021-11-21 DIAGNOSIS — A419 Sepsis, unspecified organism: Secondary | ICD-10-CM | POA: Diagnosis not present

## 2021-11-21 DIAGNOSIS — R2689 Other abnormalities of gait and mobility: Secondary | ICD-10-CM | POA: Diagnosis not present

## 2021-11-21 DIAGNOSIS — M86272 Subacute osteomyelitis, left ankle and foot: Secondary | ICD-10-CM | POA: Diagnosis not present

## 2021-11-21 DIAGNOSIS — I739 Peripheral vascular disease, unspecified: Secondary | ICD-10-CM | POA: Diagnosis not present

## 2021-11-21 DIAGNOSIS — Z89439 Acquired absence of unspecified foot: Secondary | ICD-10-CM | POA: Diagnosis not present

## 2021-11-21 DIAGNOSIS — R279 Unspecified lack of coordination: Secondary | ICD-10-CM | POA: Diagnosis not present

## 2021-11-21 DIAGNOSIS — R262 Difficulty in walking, not elsewhere classified: Secondary | ICD-10-CM | POA: Diagnosis not present

## 2021-11-21 DIAGNOSIS — M6281 Muscle weakness (generalized): Secondary | ICD-10-CM | POA: Diagnosis not present

## 2021-11-21 DIAGNOSIS — I69351 Hemiplegia and hemiparesis following cerebral infarction affecting right dominant side: Secondary | ICD-10-CM | POA: Diagnosis not present

## 2021-11-22 DIAGNOSIS — I739 Peripheral vascular disease, unspecified: Secondary | ICD-10-CM | POA: Diagnosis not present

## 2021-11-22 DIAGNOSIS — M86272 Subacute osteomyelitis, left ankle and foot: Secondary | ICD-10-CM | POA: Diagnosis not present

## 2021-11-22 DIAGNOSIS — A419 Sepsis, unspecified organism: Secondary | ICD-10-CM | POA: Diagnosis not present

## 2021-11-22 DIAGNOSIS — I69351 Hemiplegia and hemiparesis following cerebral infarction affecting right dominant side: Secondary | ICD-10-CM | POA: Diagnosis not present

## 2021-11-22 DIAGNOSIS — R2689 Other abnormalities of gait and mobility: Secondary | ICD-10-CM | POA: Diagnosis not present

## 2021-11-22 DIAGNOSIS — R262 Difficulty in walking, not elsewhere classified: Secondary | ICD-10-CM | POA: Diagnosis not present

## 2021-11-22 DIAGNOSIS — M6281 Muscle weakness (generalized): Secondary | ICD-10-CM | POA: Diagnosis not present

## 2021-11-22 DIAGNOSIS — R279 Unspecified lack of coordination: Secondary | ICD-10-CM | POA: Diagnosis not present

## 2021-11-23 DIAGNOSIS — R262 Difficulty in walking, not elsewhere classified: Secondary | ICD-10-CM | POA: Diagnosis not present

## 2021-11-23 DIAGNOSIS — A419 Sepsis, unspecified organism: Secondary | ICD-10-CM | POA: Diagnosis not present

## 2021-11-23 DIAGNOSIS — M86272 Subacute osteomyelitis, left ankle and foot: Secondary | ICD-10-CM | POA: Diagnosis not present

## 2021-11-23 DIAGNOSIS — M6281 Muscle weakness (generalized): Secondary | ICD-10-CM | POA: Diagnosis not present

## 2021-11-23 DIAGNOSIS — I69351 Hemiplegia and hemiparesis following cerebral infarction affecting right dominant side: Secondary | ICD-10-CM | POA: Diagnosis not present

## 2021-11-23 DIAGNOSIS — R2689 Other abnormalities of gait and mobility: Secondary | ICD-10-CM | POA: Diagnosis not present

## 2021-11-23 DIAGNOSIS — I739 Peripheral vascular disease, unspecified: Secondary | ICD-10-CM | POA: Diagnosis not present

## 2021-11-23 DIAGNOSIS — R279 Unspecified lack of coordination: Secondary | ICD-10-CM | POA: Diagnosis not present

## 2021-11-24 DIAGNOSIS — A419 Sepsis, unspecified organism: Secondary | ICD-10-CM | POA: Diagnosis not present

## 2021-11-24 DIAGNOSIS — R2689 Other abnormalities of gait and mobility: Secondary | ICD-10-CM | POA: Diagnosis not present

## 2021-11-24 DIAGNOSIS — M869 Osteomyelitis, unspecified: Secondary | ICD-10-CM | POA: Diagnosis not present

## 2021-11-24 DIAGNOSIS — R262 Difficulty in walking, not elsewhere classified: Secondary | ICD-10-CM | POA: Diagnosis not present

## 2021-11-24 DIAGNOSIS — J449 Chronic obstructive pulmonary disease, unspecified: Secondary | ICD-10-CM | POA: Diagnosis not present

## 2021-11-24 DIAGNOSIS — G8191 Hemiplegia, unspecified affecting right dominant side: Secondary | ICD-10-CM | POA: Diagnosis not present

## 2021-11-24 DIAGNOSIS — M6281 Muscle weakness (generalized): Secondary | ICD-10-CM | POA: Diagnosis not present

## 2021-11-24 DIAGNOSIS — I10 Essential (primary) hypertension: Secondary | ICD-10-CM | POA: Diagnosis not present

## 2021-11-24 DIAGNOSIS — R279 Unspecified lack of coordination: Secondary | ICD-10-CM | POA: Diagnosis not present

## 2021-11-24 DIAGNOSIS — M86272 Subacute osteomyelitis, left ankle and foot: Secondary | ICD-10-CM | POA: Diagnosis not present

## 2021-11-24 DIAGNOSIS — I69351 Hemiplegia and hemiparesis following cerebral infarction affecting right dominant side: Secondary | ICD-10-CM | POA: Diagnosis not present

## 2021-11-24 DIAGNOSIS — Z89439 Acquired absence of unspecified foot: Secondary | ICD-10-CM | POA: Diagnosis not present

## 2021-11-24 DIAGNOSIS — I739 Peripheral vascular disease, unspecified: Secondary | ICD-10-CM | POA: Diagnosis not present

## 2021-11-25 DIAGNOSIS — I739 Peripheral vascular disease, unspecified: Secondary | ICD-10-CM | POA: Diagnosis not present

## 2021-11-25 DIAGNOSIS — R2689 Other abnormalities of gait and mobility: Secondary | ICD-10-CM | POA: Diagnosis not present

## 2021-11-25 DIAGNOSIS — M6281 Muscle weakness (generalized): Secondary | ICD-10-CM | POA: Diagnosis not present

## 2021-11-25 DIAGNOSIS — A419 Sepsis, unspecified organism: Secondary | ICD-10-CM | POA: Diagnosis not present

## 2021-11-25 DIAGNOSIS — R279 Unspecified lack of coordination: Secondary | ICD-10-CM | POA: Diagnosis not present

## 2021-11-25 DIAGNOSIS — M869 Osteomyelitis, unspecified: Secondary | ICD-10-CM | POA: Diagnosis not present

## 2021-11-25 DIAGNOSIS — M86272 Subacute osteomyelitis, left ankle and foot: Secondary | ICD-10-CM | POA: Diagnosis not present

## 2021-11-25 DIAGNOSIS — I69351 Hemiplegia and hemiparesis following cerebral infarction affecting right dominant side: Secondary | ICD-10-CM | POA: Diagnosis not present

## 2021-11-25 DIAGNOSIS — R531 Weakness: Secondary | ICD-10-CM | POA: Diagnosis not present

## 2021-11-25 DIAGNOSIS — U071 COVID-19: Secondary | ICD-10-CM | POA: Diagnosis not present

## 2021-11-25 DIAGNOSIS — R262 Difficulty in walking, not elsewhere classified: Secondary | ICD-10-CM | POA: Diagnosis not present

## 2021-11-30 ENCOUNTER — Encounter (HOSPITAL_COMMUNITY): Payer: Medicare Other

## 2021-11-30 ENCOUNTER — Ambulatory Visit: Payer: Medicare Other

## 2021-11-30 ENCOUNTER — Other Ambulatory Visit (HOSPITAL_COMMUNITY): Payer: Medicare Other

## 2021-12-02 ENCOUNTER — Encounter (HOSPITAL_COMMUNITY): Payer: Self-pay

## 2021-12-02 ENCOUNTER — Ambulatory Visit: Payer: Medicare Other

## 2021-12-02 ENCOUNTER — Encounter (HOSPITAL_COMMUNITY): Payer: Medicare Other

## 2022-01-13 ENCOUNTER — Encounter (HOSPITAL_COMMUNITY): Payer: Medicare Other

## 2022-01-13 ENCOUNTER — Ambulatory Visit: Payer: Medicare Other

## 2022-01-13 ENCOUNTER — Encounter (HOSPITAL_COMMUNITY): Payer: Self-pay

## 2022-01-28 ENCOUNTER — Other Ambulatory Visit: Payer: Self-pay

## 2022-01-28 DIAGNOSIS — I739 Peripheral vascular disease, unspecified: Secondary | ICD-10-CM
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
## 2022-02-03 ENCOUNTER — Ambulatory Visit (INDEPENDENT_AMBULATORY_CARE_PROVIDER_SITE_OTHER): Payer: Medicare Other | Admitting: Physician Assistant

## 2022-02-03 ENCOUNTER — Ambulatory Visit (HOSPITAL_COMMUNITY)
Admission: RE | Admit: 2022-02-03 | Discharge: 2022-02-03 | Disposition: A | Discharge status: Home / Self Care | Payer: Medicare Other | Source: Ambulatory Visit | Attending: Vascular Surgery | Admitting: Vascular Surgery

## 2022-02-03 ENCOUNTER — Ambulatory Visit (INDEPENDENT_AMBULATORY_CARE_PROVIDER_SITE_OTHER)
Admission: RE | Admit: 2022-02-03 | Discharge: 2022-02-03 | Disposition: A | Discharge status: Home / Self Care | Payer: Medicare Other | Source: Ambulatory Visit | Attending: Physician Assistant | Admitting: Physician Assistant

## 2022-02-03 VITALS — BP 127/76 | HR 78 | Temp 98.6°F | Resp 20 | Ht 75.0 in

## 2022-02-03 DIAGNOSIS — I739 Peripheral vascular disease, unspecified: Secondary | ICD-10-CM | POA: Diagnosis not present

## 2022-02-03 DIAGNOSIS — F17200 Nicotine dependence, unspecified, uncomplicated: Secondary | ICD-10-CM

## 2022-02-08 ENCOUNTER — Other Ambulatory Visit: Payer: Self-pay | Admitting: *Deleted

## 2022-02-08 DIAGNOSIS — I739 Peripheral vascular disease, unspecified: Secondary | ICD-10-CM

## 2022-03-02 ENCOUNTER — Encounter: Payer: Self-pay | Admitting: Neurology

## 2022-03-02 ENCOUNTER — Ambulatory Visit (INDEPENDENT_AMBULATORY_CARE_PROVIDER_SITE_OTHER): Payer: Medicare Other | Admitting: Neurology

## 2022-03-02 VITALS — BP 137/78 | HR 70

## 2022-03-02 DIAGNOSIS — I639 Cerebral infarction, unspecified: Secondary | ICD-10-CM

## 2022-03-02 DIAGNOSIS — G40909 Epilepsy, unspecified, not intractable, without status epilepticus: Secondary | ICD-10-CM

## 2022-03-02 DIAGNOSIS — D329 Benign neoplasm of meninges, unspecified: Secondary | ICD-10-CM

## 2022-03-02 NOTE — Patient Instructions (Signed)
Continue with levetiracetam 1500 mg twice daily Continue with lacosamide 100 mg twice daily Decrease phenobarbital to 32.4 mg twice daily Follow-up in 1 year, at that time we will repeat levels For the meningioma it is 1 cm and stable, we can consider repeat imaging in 2 to 3 years. Return sooner if worse
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
## 2022-04-28 ENCOUNTER — Ambulatory Visit (INDEPENDENT_AMBULATORY_CARE_PROVIDER_SITE_OTHER)
Admission: RE | Admit: 2022-04-28 | Discharge: 2022-04-28 | Disposition: A | Discharge status: Home / Self Care | Payer: Medicare Other | Source: Ambulatory Visit | Attending: Vascular Surgery | Admitting: Vascular Surgery

## 2022-04-28 ENCOUNTER — Ambulatory Visit (HOSPITAL_COMMUNITY)
Admission: RE | Admit: 2022-04-28 | Discharge: 2022-04-28 | Disposition: A | Discharge status: Home / Self Care | Payer: Medicare Other | Source: Ambulatory Visit | Attending: Vascular Surgery | Admitting: Vascular Surgery

## 2022-04-28 ENCOUNTER — Ambulatory Visit (INDEPENDENT_AMBULATORY_CARE_PROVIDER_SITE_OTHER): Payer: Medicare Other | Admitting: Physician Assistant

## 2022-04-28 VITALS — BP 155/87 | HR 70 | Temp 97.8°F | Resp 20 | Ht 75.0 in

## 2022-04-28 DIAGNOSIS — I739 Peripheral vascular disease, unspecified: Secondary | ICD-10-CM

## 2022-04-28 DIAGNOSIS — F17200 Nicotine dependence, unspecified, uncomplicated: Secondary | ICD-10-CM | POA: Diagnosis not present

## 2022-05-04 ENCOUNTER — Other Ambulatory Visit: Payer: Self-pay

## 2022-05-04 DIAGNOSIS — I739 Peripheral vascular disease, unspecified: Secondary | ICD-10-CM

## 2022-05-19 ENCOUNTER — Emergency Department (HOSPITAL_COMMUNITY): Payer: Medicare Other

## 2022-05-19 ENCOUNTER — Encounter (HOSPITAL_COMMUNITY): Payer: Self-pay | Admitting: Student

## 2022-05-19 ENCOUNTER — Observation Stay (HOSPITAL_COMMUNITY): Payer: Medicare Other

## 2022-05-19 ENCOUNTER — Other Ambulatory Visit: Payer: Self-pay

## 2022-05-19 ENCOUNTER — Inpatient Hospital Stay (HOSPITAL_COMMUNITY)
Admission: EM | Admit: 2022-05-19 | Discharge: 2022-05-29 | DRG: 194 | Disposition: A | Discharge status: Skilled Nursing Facility | Payer: Medicare Other | Attending: Family Medicine | Admitting: Family Medicine

## 2022-05-19 DIAGNOSIS — J449 Chronic obstructive pulmonary disease, unspecified: Secondary | ICD-10-CM

## 2022-05-19 DIAGNOSIS — J189 Pneumonia, unspecified organism: Principal | ICD-10-CM | POA: Diagnosis not present

## 2022-05-19 DIAGNOSIS — B965 Pseudomonas (aeruginosa) (mallei) (pseudomallei) as the cause of diseases classified elsewhere: Secondary | ICD-10-CM | POA: Diagnosis present

## 2022-05-19 DIAGNOSIS — Z89422 Acquired absence of other left toe(s): Secondary | ICD-10-CM

## 2022-05-19 DIAGNOSIS — Z7902 Long term (current) use of antithrombotics/antiplatelets: Secondary | ICD-10-CM

## 2022-05-19 DIAGNOSIS — I4729 Other ventricular tachycardia: Secondary | ICD-10-CM | POA: Diagnosis present

## 2022-05-19 DIAGNOSIS — I69351 Hemiplegia and hemiparesis following cerebral infarction affecting right dominant side: Secondary | ICD-10-CM

## 2022-05-19 DIAGNOSIS — I472 Ventricular tachycardia, unspecified: Secondary | ICD-10-CM | POA: Diagnosis not present

## 2022-05-19 DIAGNOSIS — Z7951 Long term (current) use of inhaled steroids: Secondary | ICD-10-CM

## 2022-05-19 DIAGNOSIS — R531 Weakness: Secondary | ICD-10-CM

## 2022-05-19 DIAGNOSIS — G40909 Epilepsy, unspecified, not intractable, without status epilepticus: Secondary | ICD-10-CM | POA: Diagnosis present

## 2022-05-19 DIAGNOSIS — Z20822 Contact with and (suspected) exposure to covid-19: Secondary | ICD-10-CM | POA: Diagnosis present

## 2022-05-19 DIAGNOSIS — N400 Enlarged prostate without lower urinary tract symptoms: Secondary | ICD-10-CM | POA: Diagnosis present

## 2022-05-19 DIAGNOSIS — F17210 Nicotine dependence, cigarettes, uncomplicated: Secondary | ICD-10-CM | POA: Diagnosis present

## 2022-05-19 DIAGNOSIS — W19XXXA Unspecified fall, initial encounter: Secondary | ICD-10-CM | POA: Diagnosis present

## 2022-05-19 DIAGNOSIS — E871 Hypo-osmolality and hyponatremia: Secondary | ICD-10-CM | POA: Diagnosis not present

## 2022-05-19 DIAGNOSIS — F32A Depression, unspecified: Secondary | ICD-10-CM | POA: Diagnosis present

## 2022-05-19 DIAGNOSIS — S2241XA Multiple fractures of ribs, right side, initial encounter for closed fracture: Secondary | ICD-10-CM | POA: Diagnosis not present

## 2022-05-19 DIAGNOSIS — F17200 Nicotine dependence, unspecified, uncomplicated: Secondary | ICD-10-CM | POA: Diagnosis present

## 2022-05-19 DIAGNOSIS — R319 Hematuria, unspecified: Secondary | ICD-10-CM | POA: Diagnosis not present

## 2022-05-19 DIAGNOSIS — Z91148 Patient's other noncompliance with medication regimen for other reason: Secondary | ICD-10-CM

## 2022-05-19 DIAGNOSIS — R569 Unspecified convulsions: Secondary | ICD-10-CM | POA: Diagnosis not present

## 2022-05-19 DIAGNOSIS — I10 Essential (primary) hypertension: Secondary | ICD-10-CM | POA: Diagnosis present

## 2022-05-19 DIAGNOSIS — N179 Acute kidney failure, unspecified: Secondary | ICD-10-CM | POA: Diagnosis present

## 2022-05-19 DIAGNOSIS — R8271 Bacteriuria: Secondary | ICD-10-CM | POA: Diagnosis not present

## 2022-05-19 DIAGNOSIS — Z91128 Patient's intentional underdosing of medication regimen for other reason: Secondary | ICD-10-CM

## 2022-05-19 DIAGNOSIS — I6330 Cerebral infarction due to thrombosis of unspecified cerebral artery: Secondary | ICD-10-CM | POA: Diagnosis present

## 2022-05-19 DIAGNOSIS — J440 Chronic obstructive pulmonary disease with acute lower respiratory infection: Secondary | ICD-10-CM | POA: Diagnosis not present

## 2022-05-19 DIAGNOSIS — Z8673 Personal history of transient ischemic attack (TIA), and cerebral infarction without residual deficits: Secondary | ICD-10-CM

## 2022-05-19 DIAGNOSIS — Z8249 Family history of ischemic heart disease and other diseases of the circulatory system: Secondary | ICD-10-CM

## 2022-05-19 DIAGNOSIS — I739 Peripheral vascular disease, unspecified: Secondary | ICD-10-CM | POA: Diagnosis present

## 2022-05-19 DIAGNOSIS — M25562 Pain in left knee: Secondary | ICD-10-CM | POA: Diagnosis not present

## 2022-05-19 DIAGNOSIS — F1020 Alcohol dependence, uncomplicated: Secondary | ICD-10-CM | POA: Diagnosis not present

## 2022-05-19 DIAGNOSIS — Z79899 Other long term (current) drug therapy: Secondary | ICD-10-CM

## 2022-05-19 DIAGNOSIS — D329 Benign neoplasm of meninges, unspecified: Secondary | ICD-10-CM | POA: Diagnosis present

## 2022-05-19 DIAGNOSIS — F1090 Alcohol use, unspecified, uncomplicated: Secondary | ICD-10-CM | POA: Diagnosis present

## 2022-05-19 DIAGNOSIS — Z89432 Acquired absence of left foot: Secondary | ICD-10-CM

## 2022-05-19 DIAGNOSIS — M109 Gout, unspecified: Secondary | ICD-10-CM | POA: Diagnosis present

## 2022-05-19 DIAGNOSIS — E872 Acidosis, unspecified: Secondary | ICD-10-CM | POA: Diagnosis not present

## 2022-05-19 DIAGNOSIS — Y95 Nosocomial condition: Secondary | ICD-10-CM | POA: Diagnosis not present

## 2022-05-19 DIAGNOSIS — E8721 Acute metabolic acidosis: Secondary | ICD-10-CM | POA: Diagnosis present

## 2022-05-19 LAB — I-STAT VENOUS BLOOD GAS, ED
Acid-base deficit: 7 mmol/L — ABNORMAL HIGH (ref 0.0–2.0)
Bicarbonate: 17.4 mmol/L — ABNORMAL LOW (ref 20.0–28.0)
Calcium, Ion: 1.09 mmol/L — ABNORMAL LOW (ref 1.15–1.40)
HCT: 43 % (ref 39.0–52.0)
Hemoglobin: 14.6 g/dL (ref 13.0–17.0)
O2 Saturation: 96 %
Potassium: 3.8 mmol/L (ref 3.5–5.1)
Sodium: 139 mmol/L (ref 135–145)
TCO2: 18 mmol/L — ABNORMAL LOW (ref 22–32)
pCO2, Ven: 30.3 mmHg — ABNORMAL LOW (ref 44–60)
pH, Ven: 7.366 (ref 7.25–7.43)
pO2, Ven: 81 mmHg — ABNORMAL HIGH (ref 32–45)

## 2022-05-19 LAB — CBC WITH DIFFERENTIAL/PLATELET
Abs Immature Granulocytes: 0.07 10*3/uL (ref 0.00–0.07)
Basophils Absolute: 0 10*3/uL (ref 0.0–0.1)
Basophils Relative: 0 %
Eosinophils Absolute: 0.2 10*3/uL (ref 0.0–0.5)
Eosinophils Relative: 2 %
HCT: 40.5 % (ref 39.0–52.0)
Hemoglobin: 13.3 g/dL (ref 13.0–17.0)
Immature Granulocytes: 1 %
Lymphocytes Relative: 10 %
Lymphs Abs: 0.9 10*3/uL (ref 0.7–4.0)
MCH: 28.5 pg (ref 26.0–34.0)
MCHC: 32.8 g/dL (ref 30.0–36.0)
MCV: 86.7 fL (ref 80.0–100.0)
Monocytes Absolute: 0.5 10*3/uL (ref 0.1–1.0)
Monocytes Relative: 5 %
Neutro Abs: 8 10*3/uL — ABNORMAL HIGH (ref 1.7–7.7)
Neutrophils Relative %: 82 %
Platelets: 238 10*3/uL (ref 150–400)
RBC: 4.67 MIL/uL (ref 4.22–5.81)
RDW: 19.3 % — ABNORMAL HIGH (ref 11.5–15.5)
WBC: 9.6 10*3/uL (ref 4.0–10.5)
nRBC: 0.9 % — ABNORMAL HIGH (ref 0.0–0.2)

## 2022-05-19 LAB — URINALYSIS, ROUTINE W REFLEX MICROSCOPIC
Bilirubin Urine: NEGATIVE
Glucose, UA: NEGATIVE mg/dL
Hgb urine dipstick: NEGATIVE
Ketones, ur: NEGATIVE mg/dL
Leukocytes,Ua: NEGATIVE
Nitrite: NEGATIVE
Protein, ur: NEGATIVE mg/dL
Specific Gravity, Urine: 1.013 (ref 1.005–1.030)
pH: 6 (ref 5.0–8.0)

## 2022-05-19 LAB — RAPID URINE DRUG SCREEN, HOSP PERFORMED
Amphetamines: NOT DETECTED
Barbiturates: POSITIVE — AB
Benzodiazepines: POSITIVE — AB
Cocaine: NOT DETECTED
Opiates: NOT DETECTED
Tetrahydrocannabinol: NOT DETECTED

## 2022-05-19 LAB — COMPREHENSIVE METABOLIC PANEL
ALT: 15 U/L (ref 0–44)
AST: 35 U/L (ref 15–41)
Albumin: 3.5 g/dL (ref 3.5–5.0)
Alkaline Phosphatase: 162 U/L — ABNORMAL HIGH (ref 38–126)
Anion gap: 18 — ABNORMAL HIGH (ref 5–15)
BUN: 11 mg/dL (ref 8–23)
CO2: 16 mmol/L — ABNORMAL LOW (ref 22–32)
Calcium: 9.1 mg/dL (ref 8.9–10.3)
Chloride: 103 mmol/L (ref 98–111)
Creatinine, Ser: 1.23 mg/dL (ref 0.61–1.24)
GFR, Estimated: >60 mL/min (ref >60)
Glucose, Bld: 110 mg/dL — ABNORMAL HIGH (ref 70–99)
Potassium: 3.8 mmol/L (ref 3.5–5.1)
Sodium: 137 mmol/L (ref 135–145)
Total Bilirubin: 0.3 mg/dL (ref 0.3–1.2)
Total Protein: 6.7 g/dL (ref 6.5–8.1)

## 2022-05-19 LAB — MAGNESIUM: Magnesium: 1.8 mg/dL (ref 1.7–2.4)

## 2022-05-19 LAB — LACTIC ACID, PLASMA
Lactic Acid, Venous: 5.5 mmol/L (ref 0.5–1.9)
Lactic Acid, Venous: 2.6 mmol/L (ref 0.5–1.9)
Lactic Acid, Venous: 1.4 mmol/L (ref 0.5–1.9)

## 2022-05-19 LAB — ETHANOL: Alcohol, Ethyl (B): <10 mg/dL (ref <10)

## 2022-05-19 LAB — PHENOBARBITAL LEVEL: Phenobarbital: 7.2 ug/mL — ABNORMAL LOW (ref 15.0–40.0)

## 2022-05-19 LAB — CBG MONITORING, ED: Glucose-Capillary: 118 mg/dL — ABNORMAL HIGH (ref 70–99)

## 2022-05-19 MED ORDER — LEVETIRACETAM 500 MG/5ML IV SOLN
60.0000 mg/kg | Freq: Once | INTRAVENOUS | Status: AC
  Administered 2022-05-19: 4370 mg via INTRAVENOUS
  Filled 2022-05-19: qty 43.7

## 2022-05-19 MED ORDER — OXYCODONE HCL 5 MG PO TABS
2.5000 mg | ORAL_TABLET | ORAL | Status: DC | PRN
  Administered 2022-05-22: 2.5 mg via ORAL
  Filled 2022-05-19: qty 1

## 2022-05-19 MED ORDER — PHENOBARBITAL 16.2 MG PO TABS: 64.8000 mg | ORAL_TABLET | Freq: Two times a day (BID) | ORAL | Status: DC

## 2022-05-19 MED ORDER — SERTRALINE HCL 50 MG PO TABS
25.0000 mg | ORAL_TABLET | Freq: Every day | ORAL | Status: DC
  Administered 2022-05-20 – 2022-05-29 (×10): 25 mg via ORAL
  Filled 2022-05-19 (×10): qty 1

## 2022-05-19 MED ORDER — LACOSAMIDE 50 MG PO TABS
100.0000 mg | ORAL_TABLET | Freq: Two times a day (BID) | ORAL | Status: DC
  Administered 2022-05-19 – 2022-05-29 (×20): 100 mg via ORAL
  Filled 2022-05-19 (×20): qty 2

## 2022-05-19 MED ORDER — PANTOPRAZOLE SODIUM 40 MG PO TBEC
40.0000 mg | DELAYED_RELEASE_TABLET | Freq: Every day | ORAL | Status: DC
  Administered 2022-05-20 – 2022-05-29 (×10): 40 mg via ORAL
  Filled 2022-05-19 (×10): qty 1

## 2022-05-19 MED ORDER — LACTATED RINGERS IV BOLUS
1000.0000 mL | Freq: Once | INTRAVENOUS | Status: AC
  Administered 2022-05-19: 1000 mL via INTRAVENOUS

## 2022-05-19 MED ORDER — FOLIC ACID 1 MG PO TABS
1.0000 mg | ORAL_TABLET | Freq: Every day | ORAL | Status: DC
  Administered 2022-05-19 – 2022-05-29 (×11): 1 mg via ORAL
  Filled 2022-05-19 (×11): qty 1

## 2022-05-19 MED ORDER — SODIUM CHLORIDE 0.9 % IV SOLN
250.0000 mg | Freq: Once | INTRAVENOUS | Status: AC
  Administered 2022-05-19: 250 mg via INTRAVENOUS
  Filled 2022-05-19: qty 1.92

## 2022-05-19 MED ORDER — ACETAMINOPHEN 325 MG PO TABS
650.0000 mg | ORAL_TABLET | Freq: Four times a day (QID) | ORAL | Status: DC | PRN
  Administered 2022-05-19 – 2022-05-23 (×8): 650 mg via ORAL
  Filled 2022-05-19 (×8): qty 2

## 2022-05-19 MED ORDER — CLOPIDOGREL BISULFATE 75 MG PO TABS
75.0000 mg | ORAL_TABLET | Freq: Every day | ORAL | Status: DC
  Administered 2022-05-20 – 2022-05-29 (×10): 75 mg via ORAL
  Filled 2022-05-19 (×10): qty 1

## 2022-05-19 MED ORDER — VITAMIN B-12 1000 MCG PO TABS
1000.0000 ug | ORAL_TABLET | Freq: Every day | ORAL | Status: DC
  Administered 2022-05-19 – 2022-05-29 (×11): 1000 ug via ORAL
  Filled 2022-05-19 (×11): qty 1

## 2022-05-19 MED ORDER — ENOXAPARIN SODIUM 40 MG/0.4ML IJ SOSY: 40.0000 mg | PREFILLED_SYRINGE | INTRAMUSCULAR | Status: DC

## 2022-05-19 MED ORDER — LACOSAMIDE 50 MG PO TABS
100.0000 mg | ORAL_TABLET | Freq: Once | ORAL | Status: AC
  Administered 2022-05-19: 100 mg via ORAL
  Filled 2022-05-19: qty 2

## 2022-05-19 MED ORDER — METOPROLOL TARTRATE 25 MG PO TABS
50.0000 mg | ORAL_TABLET | Freq: Two times a day (BID) | ORAL | Status: DC
  Administered 2022-05-19 – 2022-05-29 (×20): 50 mg via ORAL
  Filled 2022-05-19 (×20): qty 2

## 2022-05-19 MED ORDER — ENOXAPARIN SODIUM 40 MG/0.4ML IJ SOSY
40.0000 mg | PREFILLED_SYRINGE | INTRAMUSCULAR | Status: DC
  Administered 2022-05-20 – 2022-05-28 (×9): 40 mg via SUBCUTANEOUS
  Filled 2022-05-19 (×9): qty 0.4

## 2022-05-19 MED ORDER — ROSUVASTATIN CALCIUM 5 MG PO TABS
10.0000 mg | ORAL_TABLET | Freq: Every day | ORAL | Status: DC
  Administered 2022-05-20 – 2022-05-29 (×10): 10 mg via ORAL
  Filled 2022-05-19 (×10): qty 2

## 2022-05-19 MED ORDER — PHENOBARBITAL 32.4 MG PO TABS
64.8000 mg | ORAL_TABLET | Freq: Every day | ORAL | Status: DC
  Administered 2022-05-19 – 2022-05-28 (×10): 64.8 mg via ORAL
  Filled 2022-05-19 (×10): qty 2

## 2022-05-19 MED ORDER — LEVETIRACETAM 500 MG PO TABS
1500.0000 mg | ORAL_TABLET | Freq: Two times a day (BID) | ORAL | Status: DC
  Administered 2022-05-19 – 2022-05-29 (×20): 1500 mg via ORAL
  Filled 2022-05-19 (×20): qty 3

## 2022-05-19 MED ORDER — ADULT MULTIVITAMIN W/MINERALS CH
1.0000 | ORAL_TABLET | Freq: Every day | ORAL | Status: DC
  Administered 2022-05-19 – 2022-05-29 (×11): 1 via ORAL
  Filled 2022-05-19 (×11): qty 1

## 2022-05-19 MED ORDER — PHENOBARBITAL 32.4 MG PO TABS
32.4000 mg | ORAL_TABLET | Freq: Every morning | ORAL | Status: DC
  Administered 2022-05-20 – 2022-05-29 (×10): 32.4 mg via ORAL
  Filled 2022-05-19 (×10): qty 1

## 2022-05-19 NOTE — ED Notes (Signed)
Patient transported to CT 

## 2022-05-19 NOTE — Assessment & Plan Note (Deleted)
Awaiting SNF dispo -Continue Plavix -Continue PT/OT

## 2022-05-19 NOTE — Assessment & Plan Note (Deleted)
PT/OT following

## 2022-05-19 NOTE — Assessment & Plan Note (Addendum)
Chronic and stable. Neurology initially consulted and signed off.  -Continue Phenobarbital 32.'4mg'$  AM and phenobarbital 64.'8mg'$  PM -Continue Levetiracetam '1500mg'$  bid -Continue Vimpat '100mg'$  bid -Seizure precautions

## 2022-05-19 NOTE — Assessment & Plan Note (Deleted)
CIWAs have been 0 so have been discontinued. - Folic acid and thiamine supplement

## 2022-05-19 NOTE — ED Notes (Signed)
Keppra paused her EDP orders

## 2022-05-19 NOTE — ED Notes (Signed)
Pt tolerated 1500 on IS, instructed on use, urine sent

## 2022-05-19 NOTE — Assessment & Plan Note (Signed)
Continue Metoprolol home dosing, monitor tele

## 2022-05-19 NOTE — Assessment & Plan Note (Deleted)
Monitor I & Os

## 2022-05-19 NOTE — ED Provider Notes (Signed)
[facility] EMERGENCY DEPARTMENT Provider Note   CSN: [id number] Arrival date & time: 05/19/22  0160     History  Chief Complaint  Patient presents with   Seizures    Seth [name] is a 66 y.o. 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  Per EMS, he had witnessed 3 times generalized tonic-clonic seizures as well as 3 focal seizures with rhythmic movement of the right wrist and hand as well as the right foot.  He was given 7.5 mg of IV Versed with EMS with cessation of activity.  His glucose was 140 with EMS.  He was found at a hotel room down on the ground.  There was no drugs present.  He told EMS that he had been off all of his antiseizure medications for the past month.  When I speak to the patient, after he is less confused and less postictal, he says he remembers rolling out of bed and thinks he may have had a seizure.  He does have some pain on the right side of his chest wall but denies any pain elsewhere.  He does endorse drinking last night at least 2-3 shot of liquor.  He tells me each typically drinks a half a pint of liquor daily but denies any history of withdrawal seizure.  He denies any cocaine or IV drug use.  He says he has been using his seizure medications including his Keppra, phenobarbital and lacosamide.  He is weak at baseline on his right side and denies any new weakness on the left side.  No recent fevers or illness. No vomiting.  Last seizure was about 2 weeks ago.  He says he has had increased seizures.   HPI     Home Medications Prior to Admission medications   Medication Sig Start Date End Date Taking? Authorizing Provider  acetaminophen (TYLENOL) 325 MG tablet Take 2 tablets (650 mg total) by mouth every 6 (six) hours as needed for headache or mild pain. 10/18/21    Mercy Riding, MD  clopidogrel (PLAVIX) 75 MG tablet Take 1 tablet (75 mg total) by mouth daily. 05/24/21   Thurnell Lose, MD  feeding supplement (ENSURE ENLIVE / ENSURE PLUS) LIQD Take 237 mLs by mouth 3 (three) times daily between meals. 07/30/20   Ghimire, Henreitta Leber, MD  ferrous sulfate 325 (65 FE) MG tablet Take 1 tablet (325 mg total) by mouth 2 (two) times daily with a meal. 10/18/21   Gonfa, Charlesetta Ivory, MD  fluticasone-salmeterol (ADVAIR) 500-50 MCG/ACT AEPB Inhale 1 puff into the lungs 2 (two) times daily. 04/13/21   [provider]  folic acid (FOLVITE) 1 MG tablet Take 1 tablet (1 mg total) by mouth daily. 03/21/21   Kathie Dike, MD  gabapentin (NEURONTIN) 100 MG capsule Take 100 mg by mouth 3 (three) times daily. 07/07/21   [provider]  INCRUSE ELLIPTA 62.5 MCG/INH AEPB INHALE 1 PUFF INTO THE LUNGS DAILY 04/08/19   Mack Hook, MD  Lacosamide 100 MG TABS Take 1 tablet (100 mg total) by mouth 2 (two) times daily. 05/24/21   Thurnell Lose, MD  levETIRAcetam (KEPPRA) 750 MG tablet Take 2 tablets (1,500 mg total) by mouth 2 (two) times daily. 03/20/21   Kathie Dike, MD  metoprolol tartrate (LOPRESSOR) 50 MG tablet Take 1 tablet (50 mg total) by mouth 2 (two) times  daily. 03/20/21   Kathie Dike, MD  Multiple Vitamins-Minerals (MULTIVITAMINS THER. W/MINERALS) TABS tablet Take 1 tablet by mouth daily.    [provider]  pantoprazole (PROTONIX) 40 MG tablet Take 1 tablet (40 mg total) by mouth daily. 07/30/20   Ghimire, Henreitta Leber, MD  PHENobarbital (LUMINAL) 32.4 MG tablet Take 1 tablet (32.4 mg total) by mouth every morning AND 2 tablets (64.8 mg total) every evening. Patient not taking: Reported on 10/11/2021 09/01/21 10/11/21  Alric Ran, MD  PHENobarbital (LUMINAL) 32.4 MG tablet Take 1 tablet (32.4 mg total) by mouth 2 (two) times daily. 10/18/21 11/17/21  Mercy Riding, MD  polyethylene glycol (MIRALAX / GLYCOLAX) 17 g packet Take 17 g by mouth  daily as needed for moderate constipation. 03/20/21   Kathie Dike, MD  rosuvastatin (CRESTOR) 10 MG tablet Take 1 tablet (10 mg total) by mouth daily. 09/28/20   Charlynne Cousins, MD  senna-docusate (SENOKOT-S) 8.6-50 MG tablet Take 1 tablet by mouth 2 (two) times daily between meals as needed for mild constipation. 10/18/21   Mercy Riding, MD  sertraline (ZOLOFT) 25 MG tablet Take 25 mg by mouth daily.    [provider]  thiamine 100 MG tablet Take 1 tablet (100 mg total) by mouth daily. 03/21/21   Kathie Dike, MD  vitamin B-12 1000 MCG tablet Take 1 tablet (1,000 mcg total) by mouth daily. 01/22/21   [place] Donnamarie Poag, DO  zinc sulfate 220 (50 Zn) MG capsule Take 1 capsule (220 mg total) by mouth daily. 10/19/21   Mercy Riding, MD      Allergies    Patient has no known allergies.    Review of Systems   Review of Systems  Physical Exam Updated Vital Signs BP (!) 149/84   Pulse 92   Temp 98.1 F (36.7 C) (Oral)   Resp (!) 25   Ht '6\' 3"'$  (1.905 m)   Wt 72.8 kg   SpO2 98%   BMI 20.05 kg/m  Physical Exam Constitutional: Male who is somnolent but will wake to voice, follows commands, confused, GCS 13 Eyes: Conjunctivae are normal. [id number] ENT      Head: Normocephalic and atraumatic.      Nose: No congestion.      Mouth/Throat: Mucous membranes are moist.  No obvious tongue lacerations.      Neck: No stridor.  C-collar in place. Cardiovascular: S1, S2, tachycardic, regular rhythm.Warm and well perfused. Respiratory: Mildly tachypneic, decreased breath sounds on the right, O2 sat 93 on 3 L nasal cannula, no wheezing or rails Gastrointestinal: Soft and nontender. Musculoskeletal: No edema of the lower extremities Neurologic: GCS 13, opens eyes to voice, follows commands, confused and postictal.  No movement of right upper extremity or right lower extremity.  Squeezes hand on left and able to lift left lower extremity against gravity. No facial droop. PERRL. Skin: Skin is  warm, dry and intact. No rash noted. Psychiatric: postictal  ED Results / Procedures / Treatments   Labs (all labs ordered are listed, but only abnormal results are displayed) Labs Reviewed  RAPID URINE DRUG SCREEN, HOSP PERFORMED - Abnormal; Notable for the following components:      Result Value   Benzodiazepines POSITIVE (*)    Barbiturates POSITIVE (*)    All other components within normal limits  COMPREHENSIVE METABOLIC PANEL - Abnormal; Notable for the following components:   CO2 16 (*)    Glucose, Bld 110 (*)    Alkaline Phosphatase 162 (*)  Anion gap 18 (*)    All other components within normal limits  CBC WITH DIFFERENTIAL/PLATELET - Abnormal; Notable for the following components:   RDW 19.3 (*)    nRBC 0.9 (*)    Neutro Abs 8.0 (*)    All other components within normal limits  LACTIC ACID, PLASMA - Abnormal; Notable for the following components:   Lactic Acid, Venous 5.5 (*)    All other components within normal limits  LACTIC ACID, PLASMA - Abnormal; Notable for the following components:   Lactic Acid, Venous 2.6 (*)    All other components within normal limits  PHENOBARBITAL LEVEL - Abnormal; Notable for the following components:   Phenobarbital 7.2 (*)    All other components within normal limits  CBG MONITORING, ED - Abnormal; Notable for the following components:   Glucose-Capillary 118 (*)    All other components within normal limits  I-STAT VENOUS BLOOD GAS, ED - Abnormal; Notable for the following components:   pCO2, Ven 30.3 (*)    pO2, Ven 81 (*)    Bicarbonate 17.4 (*)    TCO2 18 (*)    Acid-base deficit 7.0 (*)    Calcium, Ion 1.09 (*)    All other components within normal limits  URINALYSIS, ROUTINE W REFLEX MICROSCOPIC  ETHANOL  MAGNESIUM  LACOSAMIDE  LEVETIRACETAM LEVEL    EKG EKG Interpretation  Date/Time:  Friday May 19 2022 08:43:43 EDT Ventricular Rate:  123 PR Interval:  142 QRS Duration: 80 QT Interval:  320 QTC  Calculation: 458 R Axis:   34 Text Interpretation: Sinus tachycardia Confirmed by Georgina Snell [phone]) on 05/19/2022 8:52:56 AM  Radiology CT CHEST ABDOMEN PELVIS WO CONTRAST  Result Date: 05/19/2022 CLINICAL DATA:  Seizure.  Rib fracture EXAM: CT CHEST, ABDOMEN AND PELVIS WITHOUT CONTRAST TECHNIQUE: Multidetector CT imaging of the chest, abdomen and pelvis was performed following the standard protocol without IV contrast. RADIATION DOSE REDUCTION: This exam was performed according to the departmental dose-optimization program which includes automated exposure control, adjustment of the mA and/or kV according to patient size and/or use of iterative reconstruction technique. COMPARISON:  CT 02/19/2019, 07/13/2016 FINDINGS: CT CHEST FINDINGS Cardiovascular: Heart size is normal. No pericardial effusion. Mid ascending thoracic aorta measures up to 4.0 cm in diameter. Atherosclerotic calcifications of the aorta and coronary arteries. Main pulmonary artery measures 3.1 cm in diameter. Mediastinum/Nodes: No enlarged mediastinal, hilar, or axillary lymph nodes. Thyroid gland, trachea, and esophagus demonstrate no significant findings. Lungs/Pleura: 6 mm nodule abutting the pleural surface within the posterior aspect of the right upper lobe, stable from 2017 and benign. Band-like opacities within the bilateral lower lobes. Mild centrilobular emphysema. No pleural effusion or pneumothorax. Musculoskeletal: Acute mildly displaced fracture of the lateral right eighth rib. Acute minimally displaced fracture of the lateral right seventh rib. Subacute healing fractures of the anterior right fourth, fifth, and sixth ribs. Subtle superior endplate depression at T5 appears nonacute, however is new from 2020. Exaggerated thoracic kyphosis. No chest wall hematoma or subcutaneous emphysema. CT ABDOMEN PELVIS FINDINGS Hepatobiliary: Unremarkable unenhanced appearance of the liver without evidence of hepatic injury or  perihepatic hematoma. Unremarkable gallbladder. No hyperdense gallstone. No biliary dilatation. Pancreas: Unremarkable. No pancreatic ductal dilatation or surrounding inflammatory changes. Spleen: Normal in size without focal abnormality. Adrenals/Urinary Tract: No adrenal hemorrhage or renal injury identified. No renal stone or hydronephrosis. Bladder is unremarkable. Stomach/Bowel: Stomach is within normal limits. Scattered colonic diverticulosis. No evidence of bowel wall thickening, distention, or inflammatory changes. Vascular/Lymphatic: Aortic  atherosclerosis. No enlarged abdominal or pelvic lymph nodes. Reproductive: Prostate is unremarkable. Other: No free fluid. No abdominopelvic fluid collection. No pneumoperitoneum. No abdominal wall hernia. Musculoskeletal: No acute bony findings. Lumbar vertebral body heights and alignment are maintained. Pelvic bony ring intact without fracture or diastasis. Hip joints are intact without dislocation. No soft tissue hematoma. IMPRESSION: 1. Acute mildly displaced fractures of the lateral right seventh and eighth ribs. No pneumothorax. 2. Subacute healing fractures of the anterior right fourth, fifth, and sixth ribs. 3. Subtle superior endplate depression at T5 appears nonacute, however is new from 2020. Correlate with point tenderness. 4. Band-like opacities within the bilateral lower lobes, likely atelectasis. 5. No acute abdominopelvic findings. 6. Ascending thoracic aorta measuring 4.0 cm in diameter. Recommend annual imaging followup by CTA or MRA. This recommendation follows 2010 ACCF/AHA/AATS/ACR/ASA/SCA/SCAI/SIR/STS/SVM Guidelines for the Diagnosis and Management of Patients with Thoracic Aortic Disease. Circulation. 2010; 121: S854-O270. Aortic aneurysm NOS (ICD10-I71.9) 7. Aortic and coronary artery atherosclerosis (ICD10-I70.0). Electronically Signed   By: Davina Poke D.O.   On: 05/19/2022 11:00   CT Cervical Spine Wo Contrast  Result Date:  05/19/2022 CLINICAL DATA:  Unwitnessed fall EXAM: CT CERVICAL SPINE WITHOUT CONTRAST TECHNIQUE: Multidetector CT imaging of the cervical spine was performed without intravenous contrast. Multiplanar CT image reconstructions were also generated. RADIATION DOSE REDUCTION: This exam was performed according to the departmental dose-optimization program which includes automated exposure control, adjustment of the mA and/or kV according to patient size and/or use of iterative reconstruction technique. COMPARISON:  None Available. FINDINGS: Alignment: Straightening of the normal cervical lordosis. There is mild retrolisthesis of C5 on C6. Skull base and vertebrae: No acute fracture. No primary bone lesion or focal pathologic process. There is solid osseous fusion at C3-C4 after ACDF. There is no evidence hardware complications or findings to suggest loosening. Soft tissues and spinal canal: No prevertebral fluid or swelling. No visible canal hematoma. There are atherosclerotic calcifications of the bilateral carotid bifurcations. Disc levels:  No high-grade spinal canal stenosis. Upper chest: Negative. Please see separately dictated CT chest abdomen and pelvis. Other: 8 mm hypodense lesion in the perimandibular subcutaneous soft tissues (series 3, image 36) likely a small inclusion cyst. IMPRESSION: No evidence of cervical spine fracture. Electronically Signed   By: Marin Roberts M.D.   On: 05/19/2022 10:58   CT Head Wo Contrast  Result Date: 05/19/2022 CLINICAL DATA:  Unwitnessed fall.  Witnessed seizure. EXAM: CT HEAD WITHOUT CONTRAST TECHNIQUE: Contiguous axial images were obtained from the base of the skull through the vertex without intravenous contrast. RADIATION DOSE REDUCTION: This exam was performed according to the departmental dose-optimization program which includes automated exposure control, adjustment of the mA and/or kV according to patient size and/or use of iterative reconstruction technique.  COMPARISON:  CT head and neck angiogram 05/18/2021 FINDINGS: Brain: No evidence of acute infarction, hemorrhage, hydrocephalus, extra-axial collection or mass lesion/mass effect. Redemonstrated 9 x 5 mm extra-axial lesion along the false, unchanged compared to 05/18/2021 CTA head and neck angiogram, and favored to represent a meningioma. There is marked cerebellar volume loss. There are chronic bilateral cerebellar infarcts. Size and shape of the ventricular system is unchanged compared to prior exam with mild asymmetric enlargement of the left lateral ventricular system. Vascular: No hyperdense vessel or unexpected calcification. Skull: Normal. Negative for fracture or focal lesion. Sinuses/Orbits: Moderate mucosal thickening right maxillary sinus and mild mucosal thickening in the left maxillary sinus. There is pneumatization of the petrous apex on the left Other: None.  IMPRESSION: 1. No CT evidence of acute intracranial abnormality. 2. Unchanged 1 cm right parafalcine meningioma. 3. Redemonstrated generalized cerebral and cerebellar volume loss with slight asymmetric enlargement of the left lateral ventricular system and asymmetric volume loss in the anterior left temporal lobe, unchanged from prior. Electronically Signed   By: Marin Roberts M.D.   On: 05/19/2022 10:47   DG Chest Portable 1 View  Result Date: 05/19/2022 CLINICAL DATA:  decreased breath sounds EXAM: PORTABLE CHEST 1 VIEW COMPARISON:  Radiograph 10/10/2021 FINDINGS: Low lung volumes with bibasilar opacities. Trace right pleural effusion. No evidence of pneumothorax. Cardiomediastinal silhouette is unchanged. New mildly displaced right posterolateral seventh rib fracture. IMPRESSION: New mildly displaced right posterolateral seventh rib fracture. Trace right pleural effusion. Low lung volumes with bibasilar opacities, likely atelectasis, infection possible. Electronically Signed   By: Maurine Simmering M.D.   On: 05/19/2022 09:09     Procedures .Critical Care  Performed by: Elgie Congo, MD Authorized by: Elgie Congo, MD   Critical care provider statement:    Critical care time (minutes):  35   Critical care was necessary to treat or prevent imminent or life-threatening deterioration of the following conditions:  CNS failure or compromise   Critical care was time spent personally by me on the following activities:  Development of treatment plan with patient or surrogate, discussions with consultants, evaluation of patient's response to treatment, examination of patient, ordering and review of laboratory studies, ordering and review of radiographic studies, ordering and performing treatments and interventions, pulse oximetry, re-evaluation of patient's condition, review of old charts and obtaining history from patient or surrogate   Care discussed with: admitting provider     Remain on constant cardiac monitoring, sinus tachycardia.  Medications Ordered in ED Medications  folic acid (FOLVITE) tablet 1 mg (1 mg Oral Given 05/19/22 1536)  cyanocobalamin (VITAMIN B12) tablet 1,000 mcg (1,000 mcg Oral Given 05/19/22 1536)  multivitamin with minerals tablet 1 tablet (1 tablet Oral Given 05/19/22 1536)  acetaminophen (TYLENOL) tablet 650 mg (has no administration in time range)  oxyCODONE (Oxy IR/ROXICODONE) immediate release tablet 2.5 mg (has no administration in time range)  sertraline (ZOLOFT) tablet 25 mg (has no administration in time range)  rosuvastatin (CRESTOR) tablet 10 mg (has no administration in time range)  metoprolol tartrate (LOPRESSOR) tablet 50 mg (has no administration in time range)  levETIRAcetam (KEPPRA) tablet 1,500 mg (has no administration in time range)  Lacosamide TABS 100 mg (has no administration in time range)  phenobarbital (LUMINAL) tablet 32.4 mg (has no administration in time range)  phenobarbital (LUMINAL) tablet 64.8 mg (has no administration in time range)  clopidogrel  (PLAVIX) tablet 75 mg (has no administration in time range)  lactated ringers bolus 1,000 mL (0 mLs Intravenous Stopped 05/19/22 1010)  levETIRAcetam (KEPPRA) 4,370 mg in sodium chloride 0.9 % 250 mL IVPB (0 mg Intravenous Stopped 05/19/22 0947)  lacosamide (VIMPAT) tablet 100 mg (100 mg Oral Given 05/19/22 1011)  PHENObarbital (LUMINAL) 250 mg in sodium chloride 0.9 % 100 mL IVPB (0 mg Intravenous Stopped 05/19/22 1054)  lactated ringers bolus 1,000 mL (0 mLs Intravenous Stopped 05/19/22 1218)    ED Course/ Medical Decision Making/ A&P Clinical Course as of 05/19/22 1551  Fri May 19, 2022  1023 Patient's lactate 5.5 but all likely secondary to known seizures which were witnessed by EMS today.  He has a normal pH 7.366.  PCO2 30.3.  Normal white blood cell count 9.6, afebrile, unlikely infection and no  pneumonia on chest x-ray.  UA is pending.  Sodium 137, potassium 3.8.  Glucose 110.  No anemia hemoglobin 13.3.  Second liter IV fluids ordered.  Phenobarbital load ordered per neurology. Cts pending. [VB]  1400 Patient's repeat lactate down trended to 2.6.  His UA is pending.  As I discussed, suspect this was all likely secondary to seizures that occurred earlier today.  No concern for sepsis at this time.  He is back to neurologic baseline.  His CT head was unremarkable with no ICH.  His chest showed evidence of right-sided 6 and 7 rib fractures but no pneumothorax and he is pulling 1500 on incentive spirometry with no hypoxia.  Does not require trauma consult.  Questionable T5 endplate fracture but no point tenderness on exam likely old in nature.  Since patient has multiple underlying comorbidities and is high risk for decompensation, will reach out to family medicine for continued observation, continued incentive spirometry and new antiepileptic regiment. [VB]  3 Spoke with family medicine team who will admit him for continued observation and incentive spirometry and pain control and restarting  antiepileptics. [VB]    Clinical Course User Index [VB] Elgie Congo, MD                           Medical Decision Making  RAKEEM COLLEY is a 66 y.o. 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  Per EMS, he had witnessed 3 times generalized tonic-clonic seizures as well as 3 focal seizures with rhythmic movement of the right wrist and hand as well as the right foot.  He was given 7.5 mg of IV Versed with EMS with cessation of activity.    Patient presented tachycardic with sinus tachycardia to the 120s with stable blood pressure 126/77 with mild tachypnea satting 93% on 3 L nasal cannula.  GCS was 13.  He would wake up and follow commands but was postictal and confused.  Initial Glucose 118.  He has known seizure disorder due to history of subdural hematoma.  He should be on levetiracetam 1500 mg twice daily,  lacosamide 100 mg twice daily, phenobarbital to 32.4 mg twice daily.  However, according to EMS, he endorsed missing all of his medications for the past month; however, to me denies.  His presentation today is concerning for status epilepticus.  He is still protecting his airway.  Suspect his seizures today are likely secondary to noncompliance but consider polysubstance use and likely 2/2 alcohol use and will check alcohol levels and UDS.  He is afebrile but we will check for basic infection with chest x-ray and UA.  He is flaccid on the right side but appears to be consistent with known left-sided CVA.  Will obtain CT head and C-spine and CT CAP to ensure no acute traumatic injury as chest x-ray obtained showed evidence of new right-sided single rib fracture.  Less likely new stroke as exam seems consistent with baseline.  Will obtain labs to evaluate for any acute electrolyte abnormalities contributing to  seizures today and send out keppra, vimpat, and phenobarbitol levels/=.  Initially started Keppra load, but held and discontinued after about 1/3 given after speaking with Dr Cheral Marker of neurology who suggests this is breakthrough seizure as I reviewed his medication bottles which he brought with him and looks like he is appropriately taking  them.  He recommends phenobarbital load of 250 mg and then increasing his night dose of phenobarbitol back to 64.8.  Amount and/or Complexity of Data Reviewed Labs: ordered. Radiology: ordered.  Risk Prescription drug management. Decision regarding hospitalization.    Final Clinical Impression(s) / ED Diagnoses Final diagnoses:  Seizure ([place])  Closed fracture of multiple ribs of right side, initial encounter    Rx / DC Orders ED Discharge Orders     None         Elgie Congo, MD 05/19/22 1551

## 2022-05-19 NOTE — ED Triage Notes (Signed)
Brought in by [employer], pt found at hotel on floor, pt with generalized seizure x3 and multiple focal seizure(R foot and hand) witnessed, total of Versed '5mg'$  IM and Versed 2.'5mg'$  IV given, 130/80, 130, 96% on non rebreather, R-36, hx of seizure with non-compliance of meds x1 month, empty bottles of phenobarbital and keppra found with pt, CBG 140

## 2022-05-19 NOTE — Discharge Instructions (Signed)
You have been seen in the emergency department today for a likely seizure.  Your workup today including labs did not reveal a definite cause of your seizures.  Please followup with your doctor as soon as possible, ideally within the week, regarding today's emergency department visit and your likely seizure.  You will also need to follow up with a neurologist as soon as possible, please call the number listed below to schedule a follow-up appointment as soon as possible.   As we have discussed, it is very important that you DO NOT DRIVE until you have been seen and cleared by your neurologist. Remember to get plenty of sleep and avoid any alcohol or drug use.  Return to the emergency department if you have any further seizures, develop any weakness/numbness of any arm/leg, confusion, slurred speech, fever > 100.4, neck pain/stiffness or sudden/severe headache.

## 2022-05-19 NOTE — Assessment & Plan Note (Deleted)
-  Nicotine patch PRN

## 2022-05-19 NOTE — Assessment & Plan Note (Deleted)
Chronic and stable. Respiratory status stable.  - Continur incruse ellipta  - Continue dulera

## 2022-05-19 NOTE — Hospital Course (Addendum)
Seth [name] is a 66 y.o. male presenting with acute onset of brief event of breakthrough seizure.  Seizures Patient has history of seizures and took Levetiracetam '1500mg'$  bid, lacosamide '100mg'$  bid, and phenobarbital 32.'4mg'$  bid at home. He has not been taking his anti-seizure medications for 1 month prior to admission but reported to [place] different timeframes regarding his medication noncompliance.  It appears that he left [facility] and did not continue taking his antiseizure medications which caused him to have a breakthrough seizure. Neurology was on board and recommended increasing nightly phenobarbital to '64mg'$ .  We continued with this and monitored mental status. He did well while inpatient and had no further seizures.  HAP Patient starting spiking fevers and had a leukocytosis. CXR showed increased hazy airspace opacity which was concerning for HAP. Patient completed 5 day course of Cefepime.   Hx of CVA Patient has residual right sided weakness due to history of CVA. Patient started on home dose of Plavix.  No new CVA based on imaging.  Rib fractures Patient fell during seizure episode and had mildly displaced fractures of lateral right seventh and eighth ribs. Imaging also showed old, documented subacute fractures, see imaging impression for further details. Patient is satting well on room air and during hospitalization, pain was controlled with oxycodone as needed.  Chronic Alcoholism  Patient reports drinking 2-3 drinks of vodka daily. On folic acid and thiamine supplement.  Patient was monitored via CIWA protocol and did not need Ativan during his stay.  Issues to follow up  Recommend Vascular consult as outpatient for ascending thoracic aorta  Repeat CT Head 1-2 years to follow up on meningioma CT chest/abd/pelvis notable for ascending thoracic aorta measuring 4 cm in diameter, recommended annual imaging follow up by CTA or MRA.  Repeat CMP and CBC at follow up, normocytic anemia  of unknown cause, may benefit from iron studies outpatient

## 2022-05-19 NOTE — Plan of Care (Signed)
  Problem: Education: Goal: Knowledge of General Education information will improve Description Including pain rating scale, medication(s)/side effects and non-pharmacologic comfort measures Outcome: Progressing   

## 2022-05-19 NOTE — Progress Notes (Signed)
FMTS Interim Progress Note  S:Went to bedside to check on patient alongside Dr. Jerilee Hoh, patient asleep so I did not wake him.   O: BP (!) 149/84 (BP Location: Right Arm)   Pulse 85   Temp 98.3 F (36.8 C) (Oral)   Resp 18   Ht '6\' 3"'$  (1.905 m)   Wt 83.5 kg   SpO2 97%   BMI 23.00 kg/m   General: Patient resting comfortably, in no acute distress. Resp: normal work of breathing noted   A/P: Patient with history seizures and alcohol use admitted for seizure likely secondary to noncompliance. Initial neurology recommendations included MRI brain to rule out stroke, MRI demonstrated no acute intracranial infarct or other abnormality with age advanced cerebral atrophy with chronic small vessel ischemic disease with multiple remote bilateral cerebellar infarcts. 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 Will convey to day team to touch base with neurology team in the morning. Continue seizure regimen. Vitals stable, telemetry and orders reviewed. Will monitor CIWAs given alcohol use history. Remainder of plan per day team.   Donney Dice, DO 05/19/2022, 10:03 PM PGY-3, [place] [facility] pager [phone]

## 2022-05-19 NOTE — H&P (Addendum)
[name] Admission History and Physical Service Pager: [phone]  Patient name: Seth [name] Medical record number: [id number] Date of Birth: 06/23/1956 Age: 66 y.o. Gender: male  Primary Care Provider: Lucianne Lei, MD Consultants: Neuro (recs provided) Code Status: FULL  Preferred Emergency Contact:  Contact Information     Name Relation Home Work Mobile   Seth [name] Daughter   [phone]   Seth [name], Seth [name] [phone]  [phone]   Seth [name], Seth [name] [phone]  [phone]   Seth [name] Mother [phone]     Xane, [place]ms[place] Father [phone]        Chief Complaint: Brief seizure  Assessment and Plan: Seth [name] is a 66 y.o. male presenting with acute onset of brief event of supposed breakthrough seizure. Differential for presentation of this includes breakthrough seizure given medication noncompliance in the patient and history provided, also concern for acute CVA vs TIA but less likely given lack of residual symptoms and neurologic exam on admission.   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   * Seizures (Seth [name]) Patient has history of seizures and takes Levetiracetam '1500mg'$  bid, lacosamide '100mg'$  bid, and phenobarbital 32.'4mg'$  bid at home. Phenobarbital level on admission was low suggesting patient has not been taking phenobarbital. Neurology recommendations initially provided.  - Admit to [facility], attending Dr. Owens Shark - Vitals per routine - Continuous cardiac monitoring - Seizure precautions - Neurology consulted; follow up updated recs in AM  - Single dose phenobarbital 250 given  - Increase nightly phenobarbital to 64 per neuro - Ordered home dose of Keppra and Lacosamide to begin tomorrow  - MRI brain to r/o CVA    Multiple closed fractures of ribs of right side Patient has new mildly  displaced fractures of lateral right 7th and 8th ribs after fall today that is causing him acute pain. Currently stating well on RA. - Oxycodone 2.'5mg'$  q4PRN  - Avoid NSAIDs (patient on Plavix) - Will discuss with patient about subacute fractures present on imaging - Incentive Spirometry  S/P transmetatarsal amputation of foot, Seth (HCC) Clean&dry without obvious infection    Depression Sertraline home dose added   Benign prostatic hyperplasia Monitor I/Os  Nonsustained ventricular tachycardia (HCC) Continue Metoprolol home dosing, monitor tele   History of CVA with residual RUE weakness Patient has hx of CVA in 2019 and 2022 which Seth him with residual right sided weakness - Restart Plavix - f/u MRI brain - PT/OT consulted  Chronic alcoholism (Seth [name]) Patient reports 2-3 drinks of vodka every day and last drink was day before yesterday - CIWA precautions - Folic acid - Thiamine   Generalized weakness PT/OT following   COPD (chronic obstructive pulmonary disease) (HCC) IS, albuterol PRN if indicated   Tobacco use disorder Will add Nicotine patch PRN    Chronic:  -subdural hemorrhage -seizures: continue with keppra 1500 BID, lacosamide 100 BID, pheobarbital 32.4 BID  -polysubstance abuse  -peripheral vascular disease -stroke with R deficit  -depression -gout  -meningioma: stable, repeat imaging in 2-3 years   FEN/GI: none VTE Prophylaxis: Lovenox  Disposition: Observation  History of Present Illness:  Seth [name] is a 66 y.o. male presenting with breakthrough seizure while at Seth [name] this AM. Earlier today patient felt his right arm and right leg feel weak and twitch. He fell as he was trying to walk to his bed. Reports falling on his Seth side and hitting his ribs and Seth  knee. Patient states that he was diagnosed with seizures ~2 years ago. He believes today's episode feels similar to his prior seizures.   Has seen [name] most  recently on 03/02/22. Per that note, medications included Levetiracetam 1500 mg BID, Lacosamide '100mg'$  BID, Phenobarbital 32.4 mg BID for seizure disorder.    In the ED, patient brought in by [name] with generalized seizure x3 and multiple focal seizure (R food and hand) witnessed. Versed given. Patient has history of seizure with non-compliance of meds x1 month with empty bottles of phenobarbital and keppra found with patient.   Review Of Systems: Per HPI with the following additions: Denies headaches, fevers, changes in vision, chest pain, sob, nausea, vomiting, diarrhea, constipation  Pertinent Past Medical History: CVA with right sided weakness Gout BPH Seizures  Alcohol use  Depression  HTN   Remainder reviewed in history tab.   Pertinent Past Surgical History: Seth [name]  Several artherectomy for PAD   Remainder reviewed in history tab.  Pertinent Social History: Tobacco use: Yes. 1/2 pack a day Alcohol use: Day before yesterday; pint of vodka week, 2-3 cups a day  Other Substance use: none  Pertinent Family History: Family History  Problem Relation Age of Onset   Hypertension Mother    Hypertension Father    Lung cancer Neg Hx    COPD Neg Hx     Remainder reviewed in history tab.   Important Outpatient Medications: Plavix daily  Ferrous sulfate '325mg'$  BID daily  Advair BID  Gabapentin 100 TID  Incruse ellipta daily Lacosamide 100 BID  Keppra 750 BID  Lopressor 50 BID  Protonix 40 daily Phenobarbital 32.4 AM, 64.8 QHS  Crestor '10mg'$  daily  Senna BID  Zoloft 25 daily  Remainder reviewed in medication history.   Objective: BP (!) 156/90   Pulse 82   Temp 98.2 F (36.8 C) (Oral)   Resp (!) 21   Ht '6\' 3"'$  (1.905 m)   Wt 72.8 kg   SpO2 98%   BMI 20.05 kg/m  Exam: General: well appearing in no acute distress Eyes: PERRLA ENTM: Moist oral mucosa  Neck: supple, non-tender, no evidence of cervical LAD Cardiovascular: Regular rate and rhythm. No  murmurs Respiratory: scattered rhonchii throughout lung fields saturating well on RA. Gastrointestinal: soft, non-tender to palpation, presence of bowel sounds MSK: Seth foot all digits amputated; rib tenderness Derm: warm and dry Neuro: CN 2-12 grossly intact. 4/5 strength in RUE and RLE; 5/5 strength in LUE and LLE. 4/5 Grip strength on right side. Psych: AOx4  Labs:  CBC BMET  Recent Labs  Lab 05/19/22 0851 05/19/22 0902  WBC 9.6  --   HGB 13.3 14.6  HCT 40.5 43.0  PLT 238  --    Recent Labs  Lab 05/19/22 0851 05/19/22 0902  NA 137 139  K 3.8 3.8  CL 103  --   CO2 16*  --   BUN 11  --   CREATININE 1.23  --   GLUCOSE 110*  --   CALCIUM 9.1  --     Pertinent additional labs: Phenobarbital level: 7.2   EKG: My own interpretation (not copied from electronic read): sinus tachycardia    Imaging Studies Performed: Imaging Study (ie. Chest x-ray) Impression from Radiologist: CT CHEST ABDOMEN PELVIS WO CONTRAST  Result Date: 05/19/2022 CLINICAL DATA:  Seizure.  Rib fracture EXAM: CT CHEST, ABDOMEN AND PELVIS WITHOUT CONTRAST TECHNIQUE: Multidetector CT imaging of the chest, abdomen and pelvis was performed following  the standard protocol without IV contrast. RADIATION DOSE REDUCTION: This exam was performed according to the departmental dose-optimization program which includes automated exposure control, adjustment of the mA and/or kV according to patient size and/or use of iterative reconstruction technique. COMPARISON:  CT 02/19/2019, 07/13/2016 FINDINGS: CT CHEST FINDINGS Cardiovascular: Heart size is normal. No pericardial effusion. Mid ascending thoracic aorta measures up to 4.0 cm in diameter. Atherosclerotic calcifications of the aorta and coronary arteries. Main pulmonary artery measures 3.1 cm in diameter. Mediastinum/Nodes: No enlarged mediastinal, hilar, or axillary lymph nodes. Thyroid gland, trachea, and esophagus demonstrate no significant findings. Lungs/Pleura: 6 mm  nodule abutting the pleural surface within the posterior aspect of the right upper lobe, stable from 2017 and benign. Band-like opacities within the bilateral lower lobes. Mild centrilobular emphysema. No pleural effusion or pneumothorax. Musculoskeletal: Acute mildly displaced fracture of the lateral right eighth rib. Acute minimally displaced fracture of the lateral right seventh rib. Subacute healing fractures of the anterior right fourth, fifth, and sixth ribs. Subtle superior endplate depression at T5 appears nonacute, however is new from 2020. Exaggerated thoracic kyphosis. No chest wall hematoma or subcutaneous emphysema. CT ABDOMEN PELVIS FINDINGS Hepatobiliary: Unremarkable unenhanced appearance of the liver without evidence of hepatic injury or perihepatic hematoma. Unremarkable gallbladder. No hyperdense gallstone. No biliary dilatation. Pancreas: Unremarkable. No pancreatic ductal dilatation or surrounding inflammatory changes. Spleen: Normal in size without focal abnormality. Adrenals/Urinary Tract: No adrenal hemorrhage or renal injury identified. No renal stone or hydronephrosis. Bladder is unremarkable. Stomach/Bowel: Stomach is within normal limits. Scattered colonic diverticulosis. No evidence of bowel wall thickening, distention, or inflammatory changes. Vascular/Lymphatic: Aortic atherosclerosis. No enlarged abdominal or pelvic lymph nodes. Reproductive: Prostate is unremarkable. Other: No free fluid. No abdominopelvic fluid collection. No pneumoperitoneum. No abdominal wall hernia. Musculoskeletal: No acute bony findings. Lumbar vertebral body heights and alignment are maintained. Pelvic bony ring intact without fracture or diastasis. Hip joints are intact without dislocation. No soft tissue hematoma. IMPRESSION: 1. Acute mildly displaced fractures of the lateral right seventh and eighth ribs. No pneumothorax. 2. Subacute healing fractures of the anterior right fourth, fifth, and sixth ribs. 3.  Subtle superior endplate depression at T5 appears nonacute, however is new from 2020. Correlate with point tenderness. 4. Band-like opacities within the bilateral lower lobes, likely atelectasis. 5. No acute abdominopelvic findings. 6. Ascending thoracic aorta measuring 4.0 cm in diameter. Recommend annual imaging followup by CTA or MRA. This recommendation follows 2010 ACCF/AHA/AATS/ACR/ASA/SCA/SCAI/SIR/STS/SVM Guidelines for the Diagnosis and Management of Patients with Thoracic Aortic Disease. Circulation. 2010; 121: H062-B762. Aortic aneurysm NOS (ICD10-I71.9) 7. Aortic and coronary artery atherosclerosis (ICD10-I70.0). Electronically Signed   By: Davina Poke D.O.   On: 05/19/2022 11:00   CT Cervical Spine Wo Contrast  Result Date: 05/19/2022 CLINICAL DATA:  Unwitnessed fall EXAM: CT CERVICAL SPINE WITHOUT CONTRAST TECHNIQUE: Multidetector CT imaging of the cervical spine was performed without intravenous contrast. Multiplanar CT image reconstructions were also generated. RADIATION DOSE REDUCTION: This exam was performed according to the departmental dose-optimization program which includes automated exposure control, adjustment of the mA and/or kV according to patient size and/or use of iterative reconstruction technique. COMPARISON:  None Available. FINDINGS: Alignment: Straightening of the normal cervical lordosis. There is mild retrolisthesis of C5 on C6. Skull base and vertebrae: No acute fracture. No primary bone lesion or focal pathologic process. There is solid osseous fusion at C3-C4 after ACDF. There is no evidence hardware complications or findings to suggest loosening. Soft tissues and spinal canal: No prevertebral fluid  or swelling. No visible canal hematoma. There are atherosclerotic calcifications of the bilateral carotid bifurcations. Disc levels:  No high-grade spinal canal stenosis. Upper chest: Negative. Please see separately dictated CT chest abdomen and pelvis. Other: 8 mm hypodense  lesion in the perimandibular subcutaneous soft tissues (series 3, image 36) likely a small inclusion cyst. IMPRESSION: No evidence of cervical spine fracture. Electronically Signed   By: Marin Roberts M.D.   On: 05/19/2022 10:58   CT Head Wo Contrast  Result Date: 05/19/2022 CLINICAL DATA:  Unwitnessed fall.  Witnessed seizure. EXAM: CT HEAD WITHOUT CONTRAST TECHNIQUE: Contiguous axial images were obtained from the base of the skull through the vertex without intravenous contrast. RADIATION DOSE REDUCTION: This exam was performed according to the departmental dose-optimization program which includes automated exposure control, adjustment of the mA and/or kV according to patient size and/or use of iterative reconstruction technique. COMPARISON:  CT head and neck angiogram 05/18/2021 FINDINGS: Brain: No evidence of acute infarction, hemorrhage, hydrocephalus, extra-axial collection or mass lesion/mass effect. Redemonstrated 9 x 5 mm extra-axial lesion along the false, unchanged compared to 05/18/2021 CTA head and neck angiogram, and favored to represent a meningioma. There is marked cerebellar volume loss. There are chronic bilateral cerebellar infarcts. Size and shape of the ventricular system is unchanged compared to prior exam with mild asymmetric enlargement of the Seth lateral ventricular system. Vascular: No hyperdense vessel or unexpected calcification. Skull: Normal. Negative for fracture or focal lesion. Sinuses/Orbits: Moderate mucosal thickening right maxillary sinus and mild mucosal thickening in the Seth maxillary sinus. There is pneumatization of the petrous apex on the Seth Other: None. IMPRESSION: 1. No CT evidence of acute intracranial abnormality. 2. Unchanged 1 cm right parafalcine meningioma. 3. Redemonstrated generalized cerebral and cerebellar volume loss with slight asymmetric enlargement of the Seth lateral ventricular system and asymmetric volume loss in the anterior Seth temporal lobe,  unchanged from prior. Electronically Signed   By: Marin Roberts M.D.   On: 05/19/2022 10:47   DG Chest Portable 1 View  Result Date: 05/19/2022 CLINICAL DATA:  decreased breath sounds EXAM: PORTABLE CHEST 1 VIEW COMPARISON:  Radiograph 10/10/2021 FINDINGS: Low lung volumes with bibasilar opacities. Trace right pleural effusion. No evidence of pneumothorax. Cardiomediastinal silhouette is unchanged. New mildly displaced right posterolateral seventh rib fracture. IMPRESSION: New mildly displaced right posterolateral seventh rib fracture. Trace right pleural effusion. Low lung volumes with bibasilar opacities, likely atelectasis, infection possible. Electronically Signed   By: Maurine Simmering M.D.   On: 05/19/2022 09:09       Rishita Jeanette Caprice MS4 05/19/2022, 4:52 PM Acting Intern, [place] Intern pager: [phone], text pages welcome Secure chat group [place] [facility]    Resident attestation: I agree with the documentation of Student Dr. Jeanette Caprice above. I have made adjustments to her note as appropriate. I have seen the patient and performed physical exam on the patient consistent with her documented physical exam above.  Erskine Emery, MD

## 2022-05-19 NOTE — Assessment & Plan Note (Signed)
Sertraline home dose added

## 2022-05-19 NOTE — Assessment & Plan Note (Deleted)
Clean&dry without obvious infection

## 2022-05-19 NOTE — Assessment & Plan Note (Addendum)
Stable. Denies any pain or other related concerns this morning.  - Avoid NSAIDs (d/t plavix use) - Incentive Spirometry encouraged

## 2022-05-19 NOTE — Assessment & Plan Note (Signed)
Restart home plavix

## 2022-05-20 DIAGNOSIS — S2241XA Multiple fractures of ribs, right side, initial encounter for closed fracture: Secondary | ICD-10-CM | POA: Diagnosis not present

## 2022-05-20 DIAGNOSIS — R569 Unspecified convulsions: Secondary | ICD-10-CM | POA: Diagnosis not present

## 2022-05-20 LAB — CBC
HCT: 37.1 % — ABNORMAL LOW (ref 39.0–52.0)
Hemoglobin: 12.4 g/dL — ABNORMAL LOW (ref 13.0–17.0)
MCH: 28.1 pg (ref 26.0–34.0)
MCHC: 33.4 g/dL (ref 30.0–36.0)
MCV: 83.9 fL (ref 80.0–100.0)
Platelets: 213 10*3/uL (ref 150–400)
RBC: 4.42 MIL/uL (ref 4.22–5.81)
RDW: 19.1 % — ABNORMAL HIGH (ref 11.5–15.5)
WBC: 8.2 10*3/uL (ref 4.0–10.5)
nRBC: 1.1 % — ABNORMAL HIGH (ref 0.0–0.2)

## 2022-05-20 LAB — COMPREHENSIVE METABOLIC PANEL
ALT: 12 U/L (ref 0–44)
AST: 24 U/L (ref 15–41)
Albumin: 3.3 g/dL — ABNORMAL LOW (ref 3.5–5.0)
Alkaline Phosphatase: 149 U/L — ABNORMAL HIGH (ref 38–126)
Anion gap: 10 (ref 5–15)
BUN: 7 mg/dL — ABNORMAL LOW (ref 8–23)
CO2: 24 mmol/L (ref 22–32)
Calcium: 8.9 mg/dL (ref 8.9–10.3)
Chloride: 104 mmol/L (ref 98–111)
Creatinine, Ser: 0.98 mg/dL (ref 0.61–1.24)
GFR, Estimated: >60 mL/min (ref >60)
Glucose, Bld: 90 mg/dL (ref 70–99)
Potassium: 3.9 mmol/L (ref 3.5–5.1)
Sodium: 138 mmol/L (ref 135–145)
Total Bilirubin: 0.7 mg/dL (ref 0.3–1.2)
Total Protein: 6.3 g/dL — ABNORMAL LOW (ref 6.5–8.1)

## 2022-05-20 LAB — GLUCOSE, CAPILLARY: Glucose-Capillary: 98 mg/dL (ref 70–99)

## 2022-05-20 LAB — LEVETIRACETAM LEVEL: Levetiracetam Lvl: 75.2 ug/mL — ABNORMAL HIGH (ref 10.0–40.0)

## 2022-05-20 NOTE — Evaluation (Signed)
Occupational Therapy Evaluation Patient Details Name: Seth [name] MRN: [id number] DOB: 1956/01/15 Today's Date: 05/20/2022   History of Present Illness Pt adm 9/22 with seizure due to non adherence. Pt with rt side rib fx. PMH - CVA with rt residual weakness,SDH (5/22-likely traumatic) EtOH use, seizure disorder, L transmetatarsal amputation, rt rib fx's, polysubstance abuse, pvd, gout, copd   Clinical Impression   Patient admitted for the diagnosis above.  PTA he was recently living alone in a hotel room.  Patient stating he uses a quad cane at baseline, does not drive, and generally performs sink baths.  Deficits impacting independence are listed below.  Currently he is needing Min A for basic mobility at a RW level, and Min A for lower body ADL from a sit to stand level.  OT will follow in the acute setting, but short term rehab at a local SNF is recommended.  Long term ALF may be needed to assist with medications.        Recommendations for follow up therapy are one component of a multi-disciplinary discharge planning process, led by the attending physician.  Recommendations may be updated based on patient status, additional functional criteria and insurance authorization.   Follow Up Recommendations  Skilled nursing-short term rehab (<3 hours/day)    Assistance Recommended at Discharge Frequent or constant Supervision/Assistance  Patient can return home with the following A little help with walking and/or transfers;A little help with bathing/dressing/bathroom;Assistance with cooking/housework;Assist for transportation;Direct supervision/assist for financial management;Direct supervision/assist for medications management    Functional Status Assessment  Patient has had a recent decline in their functional status and demonstrates the ability to make significant improvements in function in a reasonable and predictable amount of time.  Equipment Recommendations  None recommended by OT     Recommendations for Other Services       Precautions / Restrictions Precautions Precautions: Fall Restrictions Weight Bearing Restrictions: No      Mobility Bed Mobility               General bed mobility comments: up in recliner    Transfers Overall transfer level: Needs assistance Equipment used: Rolling Mefferd (2 wheels) Transfers: Sit to/from Stand Sit to Stand: Min guard                  Balance Overall balance assessment: Needs assistance Sitting-balance support: No upper extremity supported, Feet supported Sitting balance-[name] Scale: Fair     Standing balance support: Bilateral upper extremity supported Standing balance-[name] Scale: Poor                             ADL either performed or assessed with clinical judgement   ADL Overall ADL's : Needs assistance/impaired Eating/Feeding: Independent;Sitting   Grooming: Wash/dry hands;Wash/dry face;Oral care;Min guard;Standing   Upper Body Bathing: Set up;Sitting   Lower Body Bathing: Minimal assistance;Sit to/from stand   Upper Body Dressing : Set up;Sitting   Lower Body Dressing: Minimal assistance;Sit to/from stand   Toilet Transfer: Min guard;Ambulation;Regular Toilet                   Vision Patient Visual Report: No change from baseline       Perception Perception Perception: Within Functional Limits   Praxis Praxis Praxis: Intact    Pertinent Vitals/Pain Pain Assessment Faces Pain Scale: Hurts a little bit Pain Location: rt ribs, lt knee Pain Descriptors / Indicators: Tender Pain Intervention(s): Monitored during session  Hand Dominance Right   Extremity/Trunk Assessment Upper Extremity Assessment Upper Extremity Assessment: Overall WFL for tasks assessed   Lower Extremity Assessment Lower Extremity Assessment: Defer to PT evaluation RLE Deficits / Details: chronic weakness   Cervical / Trunk Assessment Cervical / Trunk Assessment: Kyphotic    Communication Communication Communication: No difficulties   Cognition Arousal/Alertness: Awake/alert Behavior During Therapy: WFL for tasks assessed/performed Overall Cognitive Status: No family/caregiver present to determine baseline cognitive functioning                                 General Comments: following commands, and exhibits good safety.     General Comments  VSS on RA    Exercises     Shoulder Instructions      Home Living Family/patient expects to be discharged to:: Skilled nursing facility                                 Additional Comments: Pt had been at SNF until about 4-5 weeks ago when he was dc'd to sister's home. Left sister's home and went to hotel.      Prior Functioning/Environment Prior Level of Function : Independent/Modified Independent             Mobility Comments: Amb with [place]. ADLs Comments: Bathed self at sink.  Assist with dressing at baseline, has not been able to manage meds.  Does not drive.        OT Problem List: Decreased strength;Decreased activity tolerance;Impaired balance (sitting and/or standing);Pain      OT Treatment/Interventions: Self-care/ADL training;Patient/family education;Balance training;Therapeutic activities    OT Goals(Current goals can be found in the care plan section) Acute Rehab OT Goals Patient Stated Goal: hoping to go to rehab to get stronger OT Goal Formulation: With patient Time For Goal Achievement: 06/09/22 Potential to Achieve Goals: Good ADL Goals Pt Will Perform Grooming: with modified independence;standing Pt Will Perform Lower Body Dressing: with modified independence;sit to/from stand Pt Will Transfer to Toilet: with modified independence;ambulating;regular height toilet  OT Frequency: Min 2X/week    Co-evaluation              AM-PAC OT "6 Clicks" Daily Activity     Outcome Measure Help from another person eating meals?: None Help from another  person taking care of personal grooming?: A Little Help from another person toileting, which includes using toliet, bedpan, or urinal?: A Little Help from another person bathing (including washing, rinsing, drying)?: A Little Help from another person to put on and taking off regular upper body clothing?: None Help from another person to put on and taking off regular lower body clothing?: A Little 6 Click Score: 20   End of Session Equipment Utilized During Treatment: Rolling [place] (2 wheels) Nurse Communication: Mobility status  Activity Tolerance: Patient tolerated treatment well Patient left: in chair;with call bell/phone within reach;with chair alarm set  OT Visit Diagnosis: Unsteadiness on feet (R26.81);Muscle weakness (generalized) (M62.81);Pain Pain - Right/Left: Left Pain - part of body: Knee                Time: 7824-2353 OT Time Calculation (min): 20 min Charges:  OT General Charges $OT Visit: 1 Visit OT Evaluation $OT Eval Moderate Complexity: 1 Mod  05/20/2022  RP, OTR/L  Acute Rehabilitation Services  Office:  [phone]   Seth [name] 05/20/2022, 12:15 PM

## 2022-05-20 NOTE — Progress Notes (Addendum)
Daily Progress Note Intern Pager: [phone]  Patient name: Seth [name] Medical record number: [id number] Date of birth: 12-04-1955 Age: 66 y.o. Gender: male  Primary Care Provider: Lucianne Lei, MD Consultants: Neurology Code Status: Full code  Pt Overview and Major Events to Date:  9/22: Admitted to FMTS  Assessment and Plan: LEBERT LOVERN is a 66 y.o. male presenting with acute onset of brief event of seizure due to non-adherence.   * Seizures ([place]) No seizure activity since admission. -Neurology consulted -Loading phenobarbital dose given  -New regimen: phenobarbital 32.'4mg'$  AM and phenobarbital 64.'8mg'$  PM, levetiracetam '1500mg'$  bid, lacosamide '100mg'$  bid -MRI brain: No acute process. Remote cerebellar infarcts. 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 Continue to monitor for withdrawal symptoms -Continue folic acid and thiamine   Multiple closed fractures of ribs of right side Uncontrolled pain but patient only utilizing Tylenol sporadically. Discussed using prn meds for better pain control. -Oxycodone 2.'5mg'$  q4PRN  -Avoid NSAIDs (patient on Plavix) -Incentive Spirometry  History of CVA with residual RUE weakness Residual right sided weakness. -Continue Plavix -MRI: No acute process. Confirms remote cerebellar infarcts -PT/OT consulted  Tobacco use disorder -Nicotine patch PRN    FEN/GI: Heart healthy diet PPx: Lovenox '40mg'$  Dispo: pending PT/OT recs  Subjective:  Patient assessed at bedside this AM. States he is having a cough and pain in his ribs. States the cough is non-productive and started right after his seizure. States it has been keeping him up all night and making his rib pain worse. Discussed using pain medication as needed. Denies SOB and chest pain. Denies any new seizure activity. States he has not had a BM in a couple days but declined intervention  at this time. States his L knee also hurts after his seizure yesterday, no difficulty with moving it. Encouraged use of incentive spirometry.  Objective: Temp:  [98.1 F (36.7 C)-98.6 F (37 C)] 98.2 F (36.8 C) (09/23 0306) Pulse Rate:  [71-121] 71 (09/23 0306) Resp:  [14-31] 19 (09/23 0306) BP: (126-183)/(77-105) 135/87 (09/23 0306) SpO2:  [93 %-100 %] 99 % (09/23 0306) Weight:  [72.8 kg-83.5 kg] 83.5 kg (09/22 1825) Physical Exam: General: Alert, NAD Cardiovascular: RRR without murmur, rub or gallop Respiratory: Some coughing during exam. Normal work of breathing on RA. CTAB Abdomen: Soft, non-tender MSK: Tender only to deep palpation of anterior R ribs 5-8.  Extremities: Tender to palpation over L lateral knee. No effusion or erythema noted. ROM fully intact  Laboratory: Most recent CBC Lab Results  Component Value Date   WBC 8.2 05/20/2022   HGB 12.4 (L) 05/20/2022   HCT 37.1 (L) 05/20/2022   MCV 83.9 05/20/2022   PLT 213 05/20/2022   Most recent BMP    Latest Ref Rng & Units 05/19/2022    9:02 AM  BMP  Sodium 135 - 145 mmol/L 139   Potassium 3.5 - 5.1 mmol/L 3.8     Imaging/Diagnostic Tests: MR BRAIN WO CONTRAST Result Date: 05/19/2022 IMPRESSION: 1. No acute intracranial infarct or other abnormality. 2. Age advanced cerebral atrophy with chronic small vessel ischemic disease, with multiple remote bilateral cerebellar infarcts. 3. 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 4. 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 Electronically Signed   By: BJeannine BogaM.D.   On: 05/19/2022 21:12    HColletta [place]  MD 05/20/2022, 7:01 AM  PGY-1, [employer] Intern pager: [phone], text pages welcome Secure chat group [email or web address]

## 2022-05-20 NOTE — Care Management Obs Status (Signed)
[place] NOTIFICATION   Patient Details  Name: Seth [name] MRN: [id number] Date of Birth: 1955-12-22   Medicare Observation Status Notification Given:  Yes    Carles Collet, RN 05/20/2022, 3:42 PM

## 2022-05-20 NOTE — Evaluation (Signed)
Physical Therapy Evaluation Patient Details Name: Seth [name] MRN: [id number] DOB: June 30, 1956 Today's Date: 05/20/2022  History of Present Illness  Pt adm 9/22 with seizure due to non adherence. Pt with rt side rib fx. PMH - CVA with rt residual weakness,SDH (5/22-likely traumatic) EtOH use, seizure disorder, L transmetatarsal amputation, rt rib fx's, polysubstance abuse, pvd, gout, copd  Clinical Impression  Pt presents to PT with decr mobility and requiring assist for mobility. Per pt he was at [facility] until 4-5 weeks ago and then went to sisters home. Left sister's home and was staying at hotel prior to admission. Recommend SNF for rehab. Feel eventually pt will need long term supervised setting.        Recommendations for follow up therapy are one component of a multi-disciplinary discharge planning process, led by the attending physician.  Recommendations may be updated based on patient status, additional functional criteria and insurance authorization.  Follow Up Recommendations Skilled nursing-short term rehab (<3 hours/day) Can patient physically be transported by private vehicle: Yes    Assistance Recommended at Discharge Frequent or constant Supervision/Assistance  Patient can return home with the following  A little help with walking and/or transfers;Help with stairs or ramp for entrance;Assist for transportation;Direct supervision/assist for medications management;A little help with bathing/dressing/bathroom    Equipment Recommendations None recommended by PT  Recommendations for Other Services       Functional Status Assessment Patient has had a recent decline in their functional status and demonstrates the ability to make significant improvements in function in a reasonable and predictable amount of time.     Precautions / Restrictions Precautions Precautions: Fall Restrictions Weight Bearing Restrictions: No      Mobility  Bed Mobility Overal bed mobility:  Needs Assistance Bed Mobility: Supine to Sit     Supine to sit: Min assist     General bed mobility comments: assist to elevate trunk into sitting    Transfers Overall transfer level: Needs assistance Equipment used: Rolling [place]owski (2 wheels) Transfers: Sit to/from Stand Sit to Stand: Min assist, From elevated surface           General transfer comment: Assist to bring hips up and for stability    Ambulation/Gait Ambulation/Gait assistance: Min assist Gait Distance (Feet): 100 Feet Assistive device: Rolling Salahuddin (2 wheels) Gait Pattern/deviations: Step-through pattern, Decreased stride length, Trunk flexed Gait velocity: decr Gait velocity interpretation: <1.31 ft/sec, indicative of household ambulator   General Gait Details: Assist for balance and support  Science writer    Modified Rankin (Stroke Patients Only)       Balance Overall balance assessment: Needs assistance Sitting-balance support: No upper extremity supported, Feet supported Sitting balance-[name] Scale: Fair     Standing balance support: Bilateral upper extremity supported Standing balance-[name] Scale: Poor Standing balance comment: Akter and min guard for static standing                             Pertinent Vitals/Pain Pain Assessment Pain Assessment: Faces Faces Pain Scale: Hurts little more Pain Location: rt ribs, lt knee Pain Descriptors / Indicators: Grimacing, Sore Pain Intervention(s): Limited activity within patient's tolerance, Repositioned    Home Living Family/patient expects to be discharged to:: Skilled nursing facility                   Additional Comments: Pt had been at [facility]  until about 4-5 weeks ago when he was dc'd to sister's home. Left sister's home and went to hotel.    Prior Function Prior Level of Function : Independent/Modified Independent             Mobility Comments: Amb with [place].       Hand  Dominance   Dominant Hand: Right    Extremity/Trunk Assessment   Upper Extremity Assessment Upper Extremity Assessment: Defer to OT evaluation    Lower Extremity Assessment Lower Extremity Assessment: RLE deficits/detail;Generalized weakness RLE Deficits / Details: chronic weakness    Cervical / Trunk Assessment Cervical / Trunk Assessment: Other exceptions (flexed forward head)  Communication   Communication: No difficulties  Cognition Arousal/Alertness: Awake/alert Behavior During Therapy: WFL for tasks assessed/performed Overall Cognitive Status: No family/caregiver present to determine baseline cognitive functioning                                          General Comments General comments (skin integrity, edema, etc.): VSS on RA    Exercises     Assessment/Plan    PT Assessment Patient needs continued PT services  PT Problem List Decreased strength;Decreased balance;Decreased mobility       PT Treatment Interventions DME instruction;Gait training;Functional mobility training;Therapeutic activities;Therapeutic exercise;Balance training;Patient/family education    PT Goals (Current goals can be found in the Care Plan section)  Acute Rehab PT Goals Patient Stated Goal: go back to rehab PT Goal Formulation: With patient Time For Goal Achievement: 06/03/22 Potential to Achieve Goals: Good    Frequency Min 3X/week     Co-evaluation               AM-PAC PT "6 Clicks" Mobility  Outcome Measure Help needed turning from your back to your side while in a flat bed without using bedrails?: A Little Help needed moving from lying on your back to sitting on the side of a flat bed without using bedrails?: A Little Help needed moving to and from a bed to a chair (including a wheelchair)?: A Little Help needed standing up from a chair using your arms (e.g., wheelchair or bedside chair)?: A Little Help needed to walk in hospital room?: A Little Help  needed climbing 3-5 steps with a railing? : A Lot 6 Click Score: 17    End of Session Equipment Utilized During Treatment: Gait belt Activity Tolerance: Patient tolerated treatment well Patient left: in chair;with call bell/phone within reach;with chair alarm set Nurse Communication: Mobility status PT Visit Diagnosis: Other abnormalities of gait and mobility (R26.89);Unsteadiness on feet (R26.81);History of falling (Z91.81)    Time: 1062-6948 PT Time Calculation (min) (ACUTE ONLY): 22 min   Charges:   PT Evaluation $PT Eval Moderate Complexity: 1 [place] [facility] [place] 05/20/2022, 11:29 AM

## 2022-05-21 DIAGNOSIS — R569 Unspecified convulsions: Secondary | ICD-10-CM | POA: Diagnosis not present

## 2022-05-21 LAB — CBC
HCT: 38.7 % — ABNORMAL LOW (ref 39.0–52.0)
Hemoglobin: 12.8 g/dL — ABNORMAL LOW (ref 13.0–17.0)
MCH: 27.9 pg (ref 26.0–34.0)
MCHC: 33.1 g/dL (ref 30.0–36.0)
MCV: 84.5 fL (ref 80.0–100.0)
Platelets: 197 10*3/uL (ref 150–400)
RBC: 4.58 MIL/uL (ref 4.22–5.81)
RDW: 19.2 % — ABNORMAL HIGH (ref 11.5–15.5)
WBC: 6.9 10*3/uL (ref 4.0–10.5)
nRBC: 0.4 % — ABNORMAL HIGH (ref 0.0–0.2)

## 2022-05-21 LAB — COMPREHENSIVE METABOLIC PANEL
ALT: 11 U/L (ref 0–44)
AST: 21 U/L (ref 15–41)
Albumin: 3 g/dL — ABNORMAL LOW (ref 3.5–5.0)
Alkaline Phosphatase: 134 U/L — ABNORMAL HIGH (ref 38–126)
Anion gap: 15 (ref 5–15)
BUN: 6 mg/dL — ABNORMAL LOW (ref 8–23)
CO2: 17 mmol/L — ABNORMAL LOW (ref 22–32)
Calcium: 8.8 mg/dL — ABNORMAL LOW (ref 8.9–10.3)
Chloride: 104 mmol/L (ref 98–111)
Creatinine, Ser: 0.96 mg/dL (ref 0.61–1.24)
GFR, Estimated: >60 mL/min (ref >60)
Glucose, Bld: 94 mg/dL (ref 70–99)
Potassium: 3.7 mmol/L (ref 3.5–5.1)
Sodium: 136 mmol/L (ref 135–145)
Total Bilirubin: 0.6 mg/dL (ref 0.3–1.2)
Total Protein: 6 g/dL — ABNORMAL LOW (ref 6.5–8.1)

## 2022-05-21 NOTE — Consult Note (Addendum)
Neurology Consultation  Reason for Consult: Seizure medication  Referring Physician: Dr. Owens Shark   CC: Seizure  History is obtained from:patient and medical record   HPI: Seth [name] is a 66 y.o. male with past medical history of CVA with residual right-sided weakness, seizure disorder, alcohol use, tobacco use, ETOH abuse, gout, BPH and HTN who presented to the hospital with a seizure.  Patient brought in by [facility] with generalized seizure x3 and multiple focal seizure (R food and hand) witnessed. Versed given. Patient has history of seizure with non-compliance of meds x1 month with empty bottles of phenobarbital and keppra found with patient.   He tells me he takes his seizure medications once daily.  His home medications include Keppra '1500mg'$  BID, Vimpat '100mg'$  BID and phenobarbital 32.'4mg'$  BID. Phenobarbital level on admission was low. No seizure activity noted while in the hospital. Neurology consulted for assistance with medication regimen.    ROS: Full ROS was performed and is negative except as noted in the HPI.    Past Medical History:  Diagnosis Date   Depression    Enlarged prostate    Gout    Hypertension    Metabolic acidosis 03/55/9741   Seizures ([place])    Stroke [facility])      Family History  Problem Relation Age of Onset   Hypertension Mother    Hypertension Father    Lung cancer Neg Hx    COPD Neg Hx      Social History:   reports that he has been smoking cigarettes. He started smoking about 43 years ago. He has a 21.00 pack-year smoking history. He has been exposed to tobacco smoke. He has never used smokeless tobacco. He reports current alcohol use. He reports that he does not currently use drugs after having used the following drugs: Marijuana.  Medications  Current Facility-Administered Medications:    acetaminophen (TYLENOL) tablet 650 mg, 650 mg, Oral, Q6H PRN, Zigmund Daniel, Allee, MD, 650 mg at 05/21/22 0421   clopidogrel (PLAVIX) tablet 75 mg,  75 mg, Oral, Daily, Zigmund Daniel, Allee, MD, 75 mg at 05/21/22 0755   cyanocobalamin (VITAMIN B12) tablet 1,000 mcg, 1,000 mcg, Oral, Daily, Maxwell, Allee, MD, 1,000 mcg at 05/21/22 0755   enoxaparin (LOVENOX) injection 40 mg, 40 mg, Subcutaneous, Q24H, Maxwell, Allee, MD, 40 mg at 63/84/53 6468   folic acid (FOLVITE) tablet 1 mg, 1 mg, Oral, Daily, Maxwell, Allee, MD, 1 mg at 05/21/22 0755   lacosamide (VIMPAT) tablet 100 mg, 100 mg, Oral, BID, Maxwell, Allee, MD, 100 mg at 05/21/22 1019   levETIRAcetam (KEPPRA) tablet 1,500 mg, 1,500 mg, Oral, BID, Maxwell, Allee, MD, 1,500 mg at 05/21/22 0755   metoprolol tartrate (LOPRESSOR) tablet 50 mg, 50 mg, Oral, BID, Zigmund Daniel, Allee, MD, 50 mg at 05/21/22 0755   multivitamin with minerals tablet 1 tablet, 1 tablet, Oral, Daily, Zigmund Daniel, Allee, MD, 1 tablet at 05/21/22 0755   oxyCODONE (Oxy IR/ROXICODONE) immediate release tablet 2.5 mg, 2.5 mg, Oral, Q4H PRN, Zigmund Daniel, Allee, MD   pantoprazole (PROTONIX) EC tablet 40 mg, 40 mg, Oral, Daily, Maxwell, Allee, MD, 40 mg at 05/21/22 0755   PHENobarbital (LUMINAL) tablet 32.4 mg, 32.4 mg, Oral, q AM, Maxwell, Allee, MD, 32.4 mg at 05/21/22 0618   PHENobarbital (LUMINAL) tablet 64.8 mg, 64.8 mg, Oral, QHS, Maxwell, Allee, MD, 64.8 mg at 05/20/22 2015   rosuvastatin (CRESTOR) tablet 10 mg, 10 mg, Oral, Daily, Maxwell, Allee, MD, 10 mg at 05/21/22 0755   sertraline (ZOLOFT) tablet 25  mg, 25 mg, Oral, Daily, Zigmund Daniel, Allee, MD, 25 mg at 05/21/22 0755   Exam: Current vital signs: BP (!) 152/87 (BP Location: Left Arm)   Pulse 64   Temp 97.6 F (36.4 C) (Oral)   Resp 19   Ht '6\' 3"'$  (1.905 m)   Wt 83.5 kg   SpO2 97%   BMI 23.00 kg/m  Vital signs in last 24 hours: Temp:  [97.6 F (36.4 C)-98.8 F (37.1 C)] 97.6 F (36.4 C) (09/24 0756) Pulse Rate:  [64-75] 64 (09/24 0756) Resp:  [18-20] 19 (09/24 0756) BP: (130-152)/(84-97) 152/87 (09/24 0756) SpO2:  [95 %-97 %] 97 % (09/24 0756)  GENERAL: Awake, alert in  NAD HEENT: - Normocephalic and atraumatic, dry mm LUNGS - Clear to auscultation bilaterally with no wheezes CV - S1S2 RRR, no m/r/g, equal pulses bilaterally. ABDOMEN - Soft, nontender, nondistended with normoactive BS Ext: warm, well perfused, intact peripheral pulses, no edema  NEURO:  Mental Status: AA&Ox3  Language: speech is clear.  Naming, repetition, fluency, and comprehension intact. Cranial Nerves: PERRL EOMI, visual fields full, no facial asymmetry, facial sensation intact, hearing intact, tongue/uvula/soft palate midline, normal sternocleidomastoid and trapezius muscle strength. No evidence of tongue atrophy or fasciculations Motor: Right side hemiparesis, slow RAM on right arm, 5/5 in left arm and leg  Tone: is normal and bulk is normal Sensation- Intact to light touch bilaterally Coordination: FTN intact bilaterally, no ataxia in BLE. Gait- deferred   Labs I have reviewed labs in epic and the results pertinent to this consultation are:  CBC    Component Value Date/Time   WBC 6.9 05/21/2022 0533   RBC 4.58 05/21/2022 0533   HGB 12.8 (L) 05/21/2022 0533   HGB 12.1 (L) 05/08/2018 1317   HCT 38.7 (L) 05/21/2022 0533   HCT 28.2 (L) 02/21/2019 0554   PLT 197 05/21/2022 0533   PLT 225 05/08/2018 1317   MCV 84.5 05/21/2022 0533   MCV 100 (H) 05/08/2018 1317   MCH 27.9 05/21/2022 0533   MCHC 33.1 05/21/2022 0533   RDW 19.2 (H) 05/21/2022 0533   RDW 17.5 (H) 05/08/2018 1317   LYMPHSABS 0.9 05/19/2022 0851   LYMPHSABS 1.7 05/08/2018 1317   MONOABS 0.5 05/19/2022 0851   EOSABS 0.2 05/19/2022 0851   EOSABS 0.2 05/08/2018 1317   BASOSABS 0.0 05/19/2022 0851   BASOSABS 0.0 05/08/2018 1317    CMP     Component Value Date/Time   NA 136 05/21/2022 0533   NA 145 (H) 05/08/2018 1317   K 3.7 05/21/2022 0533   CL 104 05/21/2022 0533   CO2 17 (L) 05/21/2022 0533   GLUCOSE 94 05/21/2022 0533   BUN 6 (L) 05/21/2022 0533   BUN 8 05/08/2018 1317   CREATININE 0.96  05/21/2022 0533   CALCIUM 8.8 (L) 05/21/2022 0533   PROT 6.0 (L) 05/21/2022 0533   PROT 6.8 05/08/2018 1317   ALBUMIN 3.0 (L) 05/21/2022 0533   ALBUMIN 4.2 05/08/2018 1317   AST 21 05/21/2022 0533   ALT 11 05/21/2022 0533   ALKPHOS 134 (H) 05/21/2022 0533   BILITOT 0.6 05/21/2022 0533   BILITOT 0.5 05/08/2018 1317   GFRNONAA >60 05/21/2022 0533   GFRAA >60 06/03/2019 0514    Lipid Panel     Component Value Date/Time   CHOL 115 10/13/2021 0058   TRIG 50 10/13/2021 0058   HDL 42 10/13/2021 0058   CHOLHDL 2.7 10/13/2021 0058   VLDL 10 10/13/2021 0058   LDLCALC 63 10/13/2021 0058  Imaging I have reviewed the images obtained:  CT-head 1. No CT evidence of acute intracranial abnormality. 2. Unchanged 1 cm right parafalcine meningioma. 3. Redemonstrated generalized cerebral and cerebellar volume loss with slight asymmetric enlargement of the left lateral ventricular system and asymmetric volume loss in the anterior left temporal lobe, unchanged from prior.   MRI examination of the brain 1. No acute intracranial infarct or other abnormality. 2. Age advanced cerebral atrophy with chronic small vessel ischemic disease, with multiple remote bilateral cerebellar infarcts. 3. 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 4. 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  Assessment: Seth [name] is a 66 y.o. male with past medical history of CVA with residual right-sided weakness, seizure disorder, alcohol use, tobacco use, ETOH abuse, gout, BPH and HTN who presented to the hospital with a seizure. He tells me he takes his seizure medications once daily. - Neurology team has educated him on the importance of taking his seizure medications as prescribed and not to miss doses. He gives no particular reason for why he takes his meds once a day, states he forgets most times.  He has been instructed to take medication as ordered and to follow up with neurology   Breakthrough Seizures in the setting of Non compliance with medications   Recommendations: - Agree with current medication regimen - Needs follow up appointment with [facility] after discharge  - Neurology will sign off. Please call with questions or concerns  Beulah Gandy DNP, ACNPC-AG   I have seen and examined the patient. I have formulated the assessment and recommendations. 66 year old male with breakthrough seizure due to semi-compliance with anticonvulsant medications. The patient has been educated regarding the importance of strict compliance with his medications, was given both physiological and common sense explanations as to why this is, and was also educated regarding the generic vs trade names for his anticonvulsants Electronically signed: Dr. Kerney Elbe

## 2022-05-21 NOTE — Progress Notes (Addendum)
     Daily Progress Note Intern Pager: [phone]  Patient name: Seth [name] Medical record number: [id number] Date of birth: 04-19-1956 Age: 66 y.o. Gender: male  Primary Care Provider: Lucianne Lei, MD Consultants: Neurology Code Status: Full code  Pt Overview and Major Events to Date:  9/2: Admitted to [facility]  Assessment and Plan: Seth [name] is a 66 year old male presenting with acute onset of seizures likely in the setting of non-adherence. Pertinent PMH/PSH includes CVA with residual right-sided weakness, seizure disorder, alcohol use, tobacco use, gout, BPH and HTN.   * Seizures (Minor) No seizure activity since admission.  Seizures likely due to noncompliance to medication.  Neurology consulted to confirm treatment regimen. -Neurology consulted -New regimen: phenobarbital 32.'4mg'$  AM and phenobarbital 64.'8mg'$  PM, levetiracetam '1500mg'$  bid, lacosamide '100mg'$  bid -Seizure precautions   Chronic alcoholism ([place]) No signs of withdrawal. -Continue folic acid and thiamine   Multiple closed fractures of ribs of right side Patient still endorses pain but very tolerable and mostly managed by Tylenol.  He has not required as needed oxy. -Oxycodone 2.'5mg'$  q4PRN  -Avoid NSAIDs (patient on Plavix) -Incentive Spirometry  History of CVA with residual RUE weakness Has residual right-sided weakness and most recent MRI shows no acute process.  PT/OT following and recommends SNF. -Continue Plavix -Continue PT/OT   Tobacco use disorder -Nicotine patch PRN   Disposition Patient was staying at a hotel prior to his admission. Before moving to the hotel he was at a nursing facility. He expressed interest in returning to same facility. PT/OT recommend SNF -TOC consult placed.   FEN/GI: Heart healthy diet PPx: Lovenox 40 mg Dispo:SNF pending clinical improvement . Barriers include proper disposition and seizure medication regimen.   Subjective:  Mr. Seth [name] said he is doing better and has  no complaints today.  Objective: Temp:  [98.5 F (36.9 C)-98.8 F (37.1 C)] 98.7 F (37.1 C) (09/24 0338) Pulse Rate:  [67-75] 73 (09/24 0338) Resp:  [17-20] 19 (09/24 0338) BP: (128-149)/(71-97) 130/93 (09/24 0338) SpO2:  [95 %-97 %] 96 % (09/24 0338) Physical Exam: General: Alert, NAD HEENT: Atraumatic, MMM, No sclera icterus CV: RRR, no murmurs, normal S1/S2 Pulm: CTAB, good WOB on RA, no crackles or wheezing Abd: Soft, no distension, no tenderness Ext: No BLE edema   Laboratory: Most recent CBC Lab Results  Component Value Date   WBC 6.9 05/21/2022   HGB 12.8 (L) 05/21/2022   HCT 38.7 (L) 05/21/2022   MCV 84.5 05/21/2022   PLT 197 05/21/2022   Most recent BMP    Latest Ref Rng & Units 05/20/2022    5:52 AM  BMP  Glucose 70 - 99 mg/dL 90   BUN 8 - 23 mg/dL 7   Creatinine 0.61 - 1.24 mg/dL 0.98   Sodium 135 - 145 mmol/L 138   Potassium 3.5 - 5.1 mmol/L 3.9   Chloride 98 - 111 mmol/L 104   CO2 22 - 32 mmol/L 24   Calcium 8.9 - 10.3 mg/dL 8.9     Imaging/Diagnostic Tests: No new images  Alen Bleacher, MD 05/21/2022, 6:41 AM  PGY-2, [place] Intern pager: [phone], text pages welcome Secure chat group [facility] Teaching Service

## 2022-05-21 NOTE — NC FL2 (Signed)
[place] LEVEL OF CARE SCREENING TOOL     IDENTIFICATION  Patient Name: Seth [name] Birthdate: Oct 08, 1955 Sex: male Admission Date (Current Location): 05/19/2022  [facility] and [place] Number:  Herbalist and Address:  [facility], [place] [address], [place], [place] [address]      Provider Number: [id number]  Attending Physician Name and Address:  Martyn Malay, MD  Relative Name and Phone Number:       Current Level of Care: Hospital Recommended Level of Care: [place] Prior Approval Number:    Date Approved/Denied:   PASRR Number: [id number]  Discharge Plan: SNF    Current Diagnoses: Patient Active Problem List   Diagnosis Date Noted   Multiple closed fractures of ribs of right side    Fall during current hospitalization 10/16/2021   Peripheral arterial disease ([place])    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, not intractable, with status epilepticus ([place])    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[facility])    Seizures ([place]) 05/17/2021   Pressure injury of skin 03/14/2021   Seizure ([place]) 03/13/2021   Alcohol use 01/01/2021   History of CVA with residual RUE weakness 01/01/2021   COVID-19 virus infection 09/14/2020   Acute osteomyelitis of left first metatarsal stump    Left fibular fracture 02/19/2019   Chronic alcoholism ([place]) 02/19/2019   Malnutrition of moderate degree 08/25/2017   Tobacco use disorder 08/23/2017    Orientation RESPIRATION BLADDER Height & Weight     Self  Normal Continent Weight: 184 lb (83.5 kg) Height:  '6\' 3"'$  (190.5 cm)  BEHAVIORAL SYMPTOMS/MOOD NEUROLOGICAL BOWEL NUTRITION STATUS      Continent Diet (See DC sumary)  AMBULATORY STATUS COMMUNICATION OF NEEDS Skin   Extensive Assist Verbally Normal                       Personal Care Assistance Level of Assistance  Bathing, Feeding, Dressing Bathing  Assistance: Limited assistance Feeding assistance: Independent Dressing Assistance: Limited assistance     Functional Limitations Info  Sight, Hearing, Speech Sight Info: Adequate Hearing Info: Adequate Speech Info: Adequate    SPECIAL CARE FACTORS FREQUENCY  OT (By licensed OT), PT (By licensed PT)     PT Frequency: 5x a week OT Frequency: 5x a week            Contractures Contractures Info: Not present    Additional Factors Info  Code Status, Allergies Code Status Info: Full Allergies Info: NKA           Current Medications (05/21/2022):  This is the current hospital active medication list Current Facility-Administered Medications  Medication Dose Route Frequency Provider Last Rate Last Admin   acetaminophen (TYLENOL) tablet 650 mg  650 mg Oral Q6H PRN Erskine Emery, MD   650 mg at 05/21/22 0421   clopidogrel (PLAVIX) tablet 75 mg  75 mg Oral Daily Erskine Emery, MD   75 mg at 05/21/22 0755   cyanocobalamin (VITAMIN B12) tablet 1,000 mcg  1,000 mcg Oral Daily Erskine Emery, MD   1,000 mcg at 05/21/22 0755   enoxaparin (LOVENOX) injection 40 mg  40 mg Subcutaneous Q24H Erskine Emery, MD   40 mg at 26/94/85 4627   folic acid (FOLVITE) tablet 1 mg  1 mg Oral Daily Erskine Emery, MD   1 mg at 05/21/22 0755   lacosamide (VIMPAT) tablet 100 mg  100 mg Oral  BID Erskine Emery, MD   100 mg at 05/21/22 1019   levETIRAcetam (KEPPRA) tablet 1,500 mg  1,500 mg Oral BID Erskine Emery, MD   1,500 mg at 05/21/22 0755   metoprolol tartrate (LOPRESSOR) tablet 50 mg  50 mg Oral BID Erskine Emery, MD   50 mg at 05/21/22 0755   multivitamin with minerals tablet 1 tablet  1 tablet Oral Daily Erskine Emery, MD   1 tablet at 05/21/22 0755   oxyCODONE (Oxy IR/ROXICODONE) immediate release tablet 2.5 mg  2.5 mg Oral Q4H PRN Erskine Emery, MD       pantoprazole (PROTONIX) EC tablet 40 mg  40 mg Oral Daily Erskine Emery, MD   40 mg at 05/21/22 0755   PHENobarbital (LUMINAL) tablet 32.4  mg  32.4 mg Oral q AM Zigmund Daniel, Allee, MD   32.4 mg at 05/21/22 0618   PHENobarbital (LUMINAL) tablet 64.8 mg  64.8 mg Oral QHS Maxwell, Allee, MD   64.8 mg at 05/20/22 2015   rosuvastatin (CRESTOR) tablet 10 mg  10 mg Oral Daily Erskine Emery, MD   10 mg at 05/21/22 0755   sertraline (ZOLOFT) tablet 25 mg  25 mg Oral Daily Erskine Emery, MD   25 mg at 05/21/22 7169     Discharge Medications: Please see discharge summary for a list of discharge medications.  Relevant Imaging Results:  Relevant Lab Results:   Additional Information SS# [id number]  Emeterio Reeve, [place]

## 2022-05-21 NOTE — TOC Initial Note (Signed)
Transition of Care [facility]) - Initial/Assessment Note    Patient Details  Name: Seth [name] MRN: [id number] Date of Birth: 28-Jul-1956  [facility] CM/SW Contact:    Carles Collet, RN Phone Number: 05/21/2022, 8:01 AM  Clinical Narrative:                 Damaris Schooner w patient at bedside to discuss DC plans. He states that he recently was living in a hotel, then moved in with his daughter [name].  He states that [name] is working on getting him in to [place], but they do not have a move in date at this time.  Discussed HH vs SNF and recs for SNF. He states that he is agreeable to SNF but would like CSW to speak w daughter [name] to plan DC.  He has access to RW and cane. If he were to go back home w [name], he states he would like [facility]SC.  Notified CSW     [place], [place] (Daughter)  [phone]  Expected Discharge Plan: Skilled Nursing Facility Barriers to Discharge: Continued Medical Work up   Patient Goals and CMS Choice Patient states their goals for this hospitalization and ongoing recovery are:: to get rehab      Expected Discharge Plan and Services Expected Discharge Plan: [place]   Discharge Planning Services: CM Consult                                          Prior Living Arrangements/Services   Lives with:: Adult Children                   Activities of Daily Living      Permission Sought/Granted                  Emotional Assessment              Admission diagnosis:  Seizure [facility]) [R56.9] Closed fracture of multiple ribs of right side, initial encounter [S22.41XA] Patient Active Problem List   Diagnosis Date Noted   Multiple closed fractures of ribs of right side    Fall during current hospitalization 10/16/2021   Peripheral arterial disease ([place])    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, not intractable, with status  epilepticus ([place])    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[place])    Seizures ([place]) 05/17/2021   Pressure injury of skin 03/14/2021   Seizure ([place]) 03/13/2021   Alcohol use 01/01/2021   History of CVA with residual RUE weakness 01/01/2021   COVID-19 virus infection 09/14/2020   Acute osteomyelitis of left first metatarsal stump    Left fibular fracture 02/19/2019   Chronic alcoholism ([place]) 02/19/2019   Malnutrition of moderate degree 08/25/2017   Tobacco use disorder 08/23/2017   PCP:  Lucianne Lei, MD Pharmacy:   [facility] ([place]), [place] - [address] [place] ([place]) [place] [address] Phone: [phone] Fax: [phone]     Social Determinants of Health (SDOH) Interventions    Readmission Risk Interventions     No data to display

## 2022-05-21 NOTE — TOC Progression Note (Signed)
Transition of Care [facility]) - Initial/Assessment Note    Patient Details  Name: Seth [name] MRN: [id number] Date of Birth: 1956-01-12  [facility]) CM/SW Contact:    Milinda Antis, [place] Phone Number: 05/21/2022, 10:07 AM  Clinical Narrative:                 CSW received consult for possible SNF placement at time of discharge. RNCM has already spoken with the patient who is in agreement with SNF, but requested that Hanna speak with patient's daughter.  Patient's daughter, [name], is in agreement with SNF placement.  The daughter reports that the patient was in a SNF 2 weeks prior to this hospitalization.  The patient was at [facility].  The daughter reports that the patient already has LT medicaid in place if the patient needs to stay LT.  CSW discussed insurance authorization process and will provide Medicare SNF ratings list. Referral will be sent out.   Skilled Nursing Rehab Facilities-   [email or web address]   Ratings out of 5 stars (the highest)   Name Address  Phone # [place] Inspection Overall  [facility] [address], [place] '5 5 2 4  '$ [facility]  [address] [place], [place] [phone] '4 2 5 5  '$ [facility], [place] '1 1 1 1  '$ [facility] [place], [place] '2 2 4 4  '$ [facility] [address], [place] '1 1 2 1  '$ Jardine N. [address], [place] [phone] '3 2 4 4  '$ [facility] [address] [place] '5 1 3 3  '$ [facility] [address], [place] '5 2 3 4  '$ [address] ([place]) [place], [place] [phone] '4 1 2 1  '$ [facility] [phone] [address], [place] [phone] '4 1 1 1  '$ [facility] [address] [facility] [phone] '3 1 2 1  '$ [facility] ([place]) [place]. [place],  [place] [phone] '4 1 1 1  '$ Dustin Flock [address] [phone] '4 2 4 4          '$ [place] '4 1 3 2  '$ [facility] [place] [address], [place] '3 1 5 4  '$ [facility], [place] [address], [place] [phone] '1 1 1 1  '$ [facility] [address], [place] [phone] '2 2 3 3          '$ [address] (no [facility]) [address], [place] [phone] '5 5 5 5  '$ [facility] (No Humana) [address] [phone] [address], [place] '4 1 4 3  '$ [facility] (No UHC) [address] [place], [place] '5 5 5 5  '$ [facility] [address] [phone] '2 3 5 5  '$ [place] [address] [place] '1 1 2 1  '$ [facility] [address], [address] [place] '3 1 1 1  '$ [facility] [place], [place] '5 2 4 5  '$ [facility]  [address] [place] '2 2 1 1  '$ [facility] [address] [place] '3 2 1 1  '$ [facility] [place], [place] '2 2 2 2          '$ [facility] [address] [place]  [phone] '1 1 1 1  '$ [place] [address], [place]  [phone] '2 4 2 2  '$ [facility] [place] [address], [place] [phone] '4 2 3 3  '$ [facility] [place] [address] [place] '2 1 1 1  '$ [facility] [place] (No Humana) [address], [place] '2 2 3 3  '$ [facility] [address] [place] [phone] '2 1 1 1          '$ [facility] [place], [place] '5 4 5 5  '$ [facility])  [facility] [address], [place] '2 1 2 1  '$ [facility]) [place] [address], [place] '3 1 4 3  '$ [place] [address], [place] '3 3 4 4  '$ [address] [place], [place] '2 3 1 1  '$ [address] [facility]) [address] [place] [phone] '2 1 4 3     '$ Expected Discharge Plan: [place] Barriers to Discharge: Continued Medical Work up   Patient Goals and CMS Choice Patient states their goals for this hospitalization and ongoing recovery are:: to get rehab      Expected Discharge Plan and Services Expected Discharge Plan: [place]   Discharge Planning Services: CM Consult                                          Prior Living Arrangements/Services   Lives with:: Adult Children                   Activities of Daily Living      Permission Sought/Granted                  Emotional Assessment              Admission diagnosis:  Seizure [facility]) [R56.9] Closed fracture of multiple ribs of right side, initial encounter [S22.41XA] Patient Active Problem List   Diagnosis Date Noted   Multiple closed fractures of ribs of right side    Fall during current hospitalization 10/16/2021   Peripheral arterial disease ([place])    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, not intractable, with status epilepticus ([place])    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[place])    Seizures ([place]) 05/17/2021   Pressure injury of skin 03/14/2021   Seizure ([place]) 03/13/2021   Alcohol use 01/01/2021   History of CVA with residual RUE weakness 01/01/2021   COVID-19 virus infection 09/14/2020   Acute osteomyelitis of left first metatarsal stump    Left fibular fracture 02/19/2019   Chronic alcoholism ([place]) 02/19/2019   Malnutrition of moderate degree 08/25/2017   Tobacco use disorder 08/23/2017   PCP:  09/05/2017, MD Pharmacy:   [facility] ([place]), [place] - [address] [place] ([place]) [place] [address] Phone: [phone] Fax:  [phone]     Social Determinants of Health (SDOH) Interventions    Readmission Risk Interventions     No data to display

## 2022-05-22 ENCOUNTER — Observation Stay (HOSPITAL_COMMUNITY): Payer: Medicare Other

## 2022-05-22 DIAGNOSIS — R569 Unspecified convulsions: Secondary | ICD-10-CM | POA: Diagnosis not present

## 2022-05-22 LAB — BASIC METABOLIC PANEL
Anion gap: 13 (ref 5–15)
BUN: 8 mg/dL (ref 8–23)
CO2: 22 mmol/L (ref 22–32)
Calcium: 8.9 mg/dL (ref 8.9–10.3)
Chloride: 100 mmol/L (ref 98–111)
Creatinine, Ser: 1.28 mg/dL — ABNORMAL HIGH (ref 0.61–1.24)
GFR, Estimated: >60 mL/min (ref >60)
Glucose, Bld: 92 mg/dL (ref 70–99)
Potassium: 4.1 mmol/L (ref 3.5–5.1)
Sodium: 135 mmol/L (ref 135–145)

## 2022-05-22 LAB — URINALYSIS, MICROSCOPIC (REFLEX)
RBC / HPF: >50 RBC/hpf (ref 0–5)
WBC, UA: >50 WBC/hpf (ref 0–5)

## 2022-05-22 LAB — URINALYSIS, ROUTINE W REFLEX MICROSCOPIC

## 2022-05-22 LAB — CBC
HCT: 41.7 % (ref 39.0–52.0)
Hemoglobin: 14.1 g/dL (ref 13.0–17.0)
MCH: 28.5 pg (ref 26.0–34.0)
MCHC: 33.8 g/dL (ref 30.0–36.0)
MCV: 84.2 fL (ref 80.0–100.0)
Platelets: 206 10*3/uL (ref 150–400)
RBC: 4.95 MIL/uL (ref 4.22–5.81)
RDW: 18.8 % — ABNORMAL HIGH (ref 11.5–15.5)
WBC: 26.2 10*3/uL — ABNORMAL HIGH (ref 4.0–10.5)
nRBC: 0.1 % (ref 0.0–0.2)

## 2022-05-22 LAB — RESP PANEL BY RT-PCR (FLU A&B, COVID) ARPGX2
Influenza A by PCR: NEGATIVE
Influenza B by PCR: NEGATIVE
SARS Coronavirus 2 by RT PCR: NEGATIVE

## 2022-05-22 LAB — LACOSAMIDE: Lacosamide: 1.4 ug/mL — ABNORMAL LOW (ref 5.0–10.0)

## 2022-05-22 MED ORDER — MOMETASONE FURO-FORMOTEROL FUM 100-5 MCG/ACT IN AERO
2.0000 | INHALATION_SPRAY | Freq: Two times a day (BID) | RESPIRATORY_TRACT | Status: DC
  Administered 2022-05-23 – 2022-05-29 (×13): 2 via RESPIRATORY_TRACT
  Filled 2022-05-22: qty 8.8

## 2022-05-22 MED ORDER — SODIUM CHLORIDE 0.9 % IV SOLN
2.0000 g | Freq: Three times a day (TID) | INTRAVENOUS | Status: AC
  Administered 2022-05-22 – 2022-05-27 (×15): 2 g via INTRAVENOUS
  Filled 2022-05-22 (×15): qty 12.5

## 2022-05-22 MED ORDER — UMECLIDINIUM BROMIDE 62.5 MCG/ACT IN AEPB
1.0000 | INHALATION_SPRAY | Freq: Every day | RESPIRATORY_TRACT | Status: DC
  Administered 2022-05-23 – 2022-05-29 (×7): 1 via RESPIRATORY_TRACT
  Filled 2022-05-22: qty 7

## 2022-05-22 NOTE — Progress Notes (Signed)
FPTS Interim Progress Note  S:  Went to patient room after rounds and he is in no acute distress. Patient reports feeling normal and at baseline. Denies fevers, chills, headaches, chest pain, sob, abdominal pain, and urinary pain.   O:  Blood pressure 119/78, pulse 97, temperature (!) 101 F (38.3 C), temperature source Oral, resp. rate (!) 21, height '6\' 3"'$  (1.905 m), weight 83.5 kg, SpO2 96 %.  Physical Exam: General: well appearing; in no acute distress Cardiovascular: tachycardic, no murmurs Pulmonary: CTAB. Normal work of breathing on RA Abdominal: No tenderness to palpation. Non-distended.  A/P: Fevers - Repeat CXR - F/u Urine culture, BMP, CBC, and Respiratory panel  Mirna Mires, MS4 05/22/2022, 12:00 PM Acting Inter, [place] Medicine Service pager [phone]

## 2022-05-22 NOTE — TOC CAGE-AID Note (Signed)
Transition of Care [facility]) - CAGE-AID Screening   Patient Details  Name: Seth [name] MRN: [id number] Date of Birth: Aug 20, 1956  Transition of Care [facility]) CM/SW Contact:    Benard Halsted, LCSW Phone Number: 05/22/2022, 4:19 PM   Clinical Narrative: Patient declined drug use but reported occasional alcohol use. He declined requiring resources.    CAGE-AID Screening:    Have You Ever Felt You Ought to Cut Down on Your Drinking or Drug Use?: No Have People Annoyed You By Critizing Your Drinking Or Drug Use?: No Have You Felt Bad Or Guilty About Your Drinking Or Drug Use?: No Have You Ever Had a Drink or Used Drugs First Thing In The Morning to Steady Your Nerves or to Get Rid of a Hangover?: No CAGE-AID Score: 0  Substance Abuse Education Offered: No (Not indicated)

## 2022-05-22 NOTE — Progress Notes (Addendum)
     Daily Progress Note Intern Pager: [phone]  Patient name: Seth [name] Medical record number: [id number] Date of birth: 13-Aug-1956 Age: 66 y.o. Gender: male  Primary Care Provider: Lucianne Lei, MD Consultants: Neurology  Code Status: Full   Pt Overview and Major Events to Date:  9/2: Admitted to FMTS  Assessment and Plan: Seth [name] is a 66 year old male presenting with acute onset of seizures likely in the setting of non-adherence. Pertinent PMH/PSH includes CVA with residual right-sided weakness, seizure disorder, alcohol use, tobacco use, gout, BPH and HTN.  * Seizures ([place]) Stable without seizure activity since admission.  -Phenobarbital 32.'4mg'$  AM and phenobarbital 64.'8mg'$  PM -Levetiracetam '1500mg'$  bid -Lacosamide '100mg'$  bid -Seizure precautions   Multiple closed fractures of ribs of right side Persistent pain worse with cough, improved with Tylenol and cough drops.  - Oxycodone 2.'5mg'$  q4PRN  - Avoid NSAIDs (d/t plavix use) - Incentive Spirometry encouraged  History of CVA with residual RUE weakness Awaiting SNF dispo -Continue Plavix -Continue PT/OT   Chronic alcoholism (HCC) CIWAs have been 0.  - Folic acid and thiamine supplement - D/c CIWAs  COPD (chronic obstructive pulmonary disease) ([place]) Documented history but does not have any home inhalers. Has persistent cough, worse at night. - Consider restarting home Advair   Tobacco use disorder -Nicotine patch PRN    FEN/GI: Hearty healthy diet PPx: Lovenox Dispo:SNF when bed available. Patient is medically stable.   Subjective:  Patient is doing well this morning and has no acute complaints. Has a cough that makes rib pain worse but patient is managing.  Objective: Temp:  [97.8 F (36.6 C)-101.7 F (38.7 C)] 101.7 F (38.7 C) (09/25 0823) Pulse Rate:  [62-81] 81 (09/25 0400) Resp:  [15-20] 19 (09/25 0823) BP: (137-167)/(75-92) 160/88 (09/25 0823) SpO2:  [95 %-99 %] 96 % (09/25  0400) Physical Exam: General: Laying in bed comfortably. Alert and conversational  Cardiovascular: Tachycardic, no murmurs. Respiratory: CTAB. Normal work of breathing on RA. No crackles or wheezing Abdomen: Soft, non-distended. Tenderness near rib fractures Extremities: No BLE edema  Laboratory: Most recent CBC Lab Results  Component Value Date   WBC 6.9 05/21/2022   HGB 12.8 (L) 05/21/2022   HCT 38.7 (L) 05/21/2022   MCV 84.5 05/21/2022   PLT 197 05/21/2022   Most recent BMP    Latest Ref Rng & Units 05/21/2022    5:33 AM  BMP  Glucose 70 - 99 mg/dL 94   BUN 8 - 23 mg/dL 6   Creatinine 0.61 - 1.24 mg/dL 0.96   Sodium 135 - 145 mmol/L 136   Potassium 3.5 - 5.1 mmol/L 3.7   Chloride 98 - 111 mmol/L 104   CO2 22 - 32 mmol/L 17   Calcium 8.9 - 10.3 mg/dL 8.8    Imaging/Diagnostic Tests: No new images  [name], Medical Student 05/22/2022, 8:40 AM  Acting Intern, [place] Intern pager: [phone], text pages welcome Secure chat group [place] Upper-Level Resident Addendum   I have independently interviewed and examined the patient. I have discussed the above with the original author and agree with their documentation. My edits for correction/addition/clarification are in within the document. Please see also any attending notes.   Rise Patience, DO  PGY-3, [facility] [facility] 05/22/2022 9:06 AM  FPTS Service pager: [phone] (text pages welcome through [name])

## 2022-05-22 NOTE — Progress Notes (Signed)
FMTS Interim Progress Note  S:Assessed with Dr. Larae Grooms. Patient resting comfortably and in no acute distress. Fevering through the day.   O: BP 136/79 (BP Location: Left Arm)   Pulse 97   Temp (!) 100.9 F (38.3 C) (Oral)   Resp (!) 24   Ht '6\' 3"'$  (1.905 m)   Wt 83.5 kg   SpO2 96%   BMI 23.00 kg/m   Sleeping comfortably   A/P: Fever:  Hematuria obstructing UA results.  - blood cultures ordered - cefepime 2g q8h ordered  - monitor fever curve   Darci Current, DO 05/22/2022, 8:37 PM PGY-1, [place] [facility] Service pager [phone]

## 2022-05-22 NOTE — Progress Notes (Signed)
   05/22/22 0823  Assess: MEWS Score  Temp (!) 101.7 F (38.7 C)  BP (!) 160/88  MAP (mmHg) 109  Resp 19  Assess: MEWS Score  MEWS Temp 2  MEWS Systolic 0  MEWS Pulse 0  MEWS RR 0  MEWS LOC 0  MEWS Score 2  MEWS Score Color Yellow  Assess: if the MEWS score is Yellow or Red  Were vital signs taken at a resting state? Yes  Focused Assessment No change from prior assessment  Does the patient meet 2 or more of the SIRS criteria? Yes  Does the patient have a confirmed or suspected source of infection? No  MEWS guidelines implemented *See Row Information* Yes  Take Vital Signs  Increase Vital Sign Frequency  Yellow: Q 2hr X 2 then Q 4hr X 2, if remains yellow, continue Q 4hrs  Escalate  MEWS: Escalate Yellow: discuss with charge nurse/RN and consider discussing with provider and RRT  Notify: Charge Nurse/RN  Name of Charge Nurse/RN Notified John  Date Charge Nurse/RN Notified 05/22/22  Time Charge Nurse/RN Notified [phone]  Notify: Provider  Provider Name/Title MD [facility]  Date Provider Notified 05/22/22  Time Provider Notified [phone]  Method of Notification Page  Notification Reason Other (Comment) (temp 101.7)  Provider response See new orders  Date of Provider Response 05/22/22  Time of Provider Response 1000  Document  Progress note created (see row info) Yes  Assess: SIRS CRITERIA  SIRS Temperature  1  SIRS Pulse 0  SIRS Respirations  0  SIRS WBC 1  SIRS Score Sum  2

## 2022-05-22 NOTE — TOC Progression Note (Addendum)
Transition of Care [facility]) - Progression Note    Patient Details  Name: ZAMIR STAPLES MRN: [id number] Date of Birth: 05-27-1956  [facility]) CM/SW [place], LCSW Phone Number: 05/22/2022, 10:00 AM  Clinical Narrative:    10am-CSW spoke with patient's daughter and provided bed offers. She will call CSW back with a decision. CSW initiated insurance authorization pending a facility choice.   3:57pm- CSW contacted patient's daughter, [name] to ascertain choice. They have selected [facility]. CSW made [place] and insurance aware so patient should be able to discharge tomorrow, auth pending.    Expected Discharge Plan: Skilled Nursing Facility Barriers to Discharge: Continued Medical Work up  Expected Discharge Plan and Services Expected Discharge Plan: [place]   Discharge Planning Services: CM Consult                                           Social Determinants of Health (SDOH) Interventions    Readmission Risk Interventions     No data to display

## 2022-05-23 DIAGNOSIS — Y95 Nosocomial condition: Secondary | ICD-10-CM

## 2022-05-23 DIAGNOSIS — G40909 Epilepsy, unspecified, not intractable, without status epilepticus: Secondary | ICD-10-CM | POA: Diagnosis present

## 2022-05-23 DIAGNOSIS — I739 Peripheral vascular disease, unspecified: Secondary | ICD-10-CM | POA: Diagnosis present

## 2022-05-23 DIAGNOSIS — F17210 Nicotine dependence, cigarettes, uncomplicated: Secondary | ICD-10-CM | POA: Diagnosis present

## 2022-05-23 DIAGNOSIS — I10 Essential (primary) hypertension: Secondary | ICD-10-CM | POA: Diagnosis present

## 2022-05-23 DIAGNOSIS — W19XXXA Unspecified fall, initial encounter: Secondary | ICD-10-CM | POA: Diagnosis present

## 2022-05-23 DIAGNOSIS — E8721 Acute metabolic acidosis: Secondary | ICD-10-CM | POA: Diagnosis present

## 2022-05-23 DIAGNOSIS — E871 Hypo-osmolality and hyponatremia: Secondary | ICD-10-CM | POA: Diagnosis not present

## 2022-05-23 DIAGNOSIS — Z20822 Contact with and (suspected) exposure to covid-19: Secondary | ICD-10-CM | POA: Diagnosis present

## 2022-05-23 DIAGNOSIS — I472 Ventricular tachycardia, unspecified: Secondary | ICD-10-CM | POA: Diagnosis not present

## 2022-05-23 DIAGNOSIS — D329 Benign neoplasm of meninges, unspecified: Secondary | ICD-10-CM | POA: Diagnosis present

## 2022-05-23 DIAGNOSIS — J189 Pneumonia, unspecified organism: Secondary | ICD-10-CM

## 2022-05-23 DIAGNOSIS — F32A Depression, unspecified: Secondary | ICD-10-CM | POA: Diagnosis present

## 2022-05-23 DIAGNOSIS — J440 Chronic obstructive pulmonary disease with acute lower respiratory infection: Secondary | ICD-10-CM | POA: Diagnosis not present

## 2022-05-23 DIAGNOSIS — N179 Acute kidney failure, unspecified: Secondary | ICD-10-CM | POA: Diagnosis not present

## 2022-05-23 DIAGNOSIS — S2241XA Multiple fractures of ribs, right side, initial encounter for closed fracture: Secondary | ICD-10-CM | POA: Diagnosis present

## 2022-05-23 DIAGNOSIS — F17200 Nicotine dependence, unspecified, uncomplicated: Secondary | ICD-10-CM | POA: Diagnosis not present

## 2022-05-23 DIAGNOSIS — F1020 Alcohol dependence, uncomplicated: Secondary | ICD-10-CM | POA: Diagnosis present

## 2022-05-23 DIAGNOSIS — E872 Acidosis, unspecified: Secondary | ICD-10-CM | POA: Diagnosis not present

## 2022-05-23 DIAGNOSIS — R569 Unspecified convulsions: Secondary | ICD-10-CM | POA: Diagnosis present

## 2022-05-23 DIAGNOSIS — Z89422 Acquired absence of other left toe(s): Secondary | ICD-10-CM | POA: Diagnosis not present

## 2022-05-23 DIAGNOSIS — I69351 Hemiplegia and hemiparesis following cerebral infarction affecting right dominant side: Secondary | ICD-10-CM | POA: Diagnosis not present

## 2022-05-23 LAB — BASIC METABOLIC PANEL
Anion gap: 10 (ref 5–15)
BUN: 11 mg/dL (ref 8–23)
CO2: 22 mmol/L (ref 22–32)
Calcium: 8.2 mg/dL — ABNORMAL LOW (ref 8.9–10.3)
Chloride: 101 mmol/L (ref 98–111)
Creatinine, Ser: 1.39 mg/dL — ABNORMAL HIGH (ref 0.61–1.24)
GFR, Estimated: 57 mL/min — ABNORMAL LOW (ref >60)
Glucose, Bld: 93 mg/dL (ref 70–99)
Potassium: 4.6 mmol/L (ref 3.5–5.1)
Sodium: 133 mmol/L — ABNORMAL LOW (ref 135–145)

## 2022-05-23 LAB — CBC
HCT: 41.7 % (ref 39.0–52.0)
Hemoglobin: 13.4 g/dL (ref 13.0–17.0)
MCH: 28.1 pg (ref 26.0–34.0)
MCHC: 32.1 g/dL (ref 30.0–36.0)
MCV: 87.4 fL (ref 80.0–100.0)
Platelets: 188 10*3/uL (ref 150–400)
RBC: 4.77 MIL/uL (ref 4.22–5.81)
RDW: 19.5 % — ABNORMAL HIGH (ref 11.5–15.5)
WBC: 34 10*3/uL — ABNORMAL HIGH (ref 4.0–10.5)
nRBC: 0.1 % (ref 0.0–0.2)

## 2022-05-23 MED ORDER — VANCOMYCIN VARIABLE DOSE PER UNSTABLE RENAL FUNCTION (PHARMACIST DOSING): Status: DC

## 2022-05-23 MED ORDER — THIAMINE MONONITRATE 100 MG PO TABS
100.0000 mg | ORAL_TABLET | Freq: Every day | ORAL | Status: DC
  Administered 2022-05-23 – 2022-05-28 (×6): 100 mg via ORAL
  Filled 2022-05-23 (×6): qty 1

## 2022-05-23 MED ORDER — SODIUM CHLORIDE 0.9 % IV BOLUS
500.0000 mL | Freq: Once | INTRAVENOUS | Status: AC
  Administered 2022-05-23: 500 mL via INTRAVENOUS

## 2022-05-23 MED ORDER — VANCOMYCIN HCL 1750 MG/350ML IV SOLN
1750.0000 mg | Freq: Once | INTRAVENOUS | Status: AC
  Administered 2022-05-23: 1750 mg via INTRAVENOUS
  Filled 2022-05-23: qty 350

## 2022-05-23 MED ORDER — SODIUM CHLORIDE 0.9 % IV SOLN: INTRAVENOUS | Status: AC
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
## 2022-05-23 NOTE — Progress Notes (Signed)
Pharmacy Antibiotic Note  Seth [name] is a 66 y.o. male admitted on 05/19/2022 with  seizures  but there is now concern for hospital acquired pneumonia. He is also on cefepime for pseudomonas coverage. Pharmacy has been consulted for vancomycin dosing for MRSA coverage.   His white blood cell count is trending up from 26.2 to 36K and his max temperature in the last 24 hours was 100.9 F. Will plan to dose vancomycin x 1 dose at ~'20mg'$ /kg.   Plan: Give Vancomycin '1750mg'$  IV x 1  Follow up to schedule regimen pending MRSA nasal swab results Monitor signs and symptoms of infection and cultures   Height: '6\' 3"'$  (190.5 cm) Weight: 83.5 kg (184 lb) IBW/kg (Calculated) : 84.5  Temp (24hrs), Avg:99.3 F (37.4 C), Min:98.7 F (37.1 C), Max:100.9 F (38.3 C)  Recent Labs  Lab 05/19/22 0851 05/19/22 1054 05/19/22 2210 05/20/22 0552 05/21/22 0533 05/22/22 1843 05/23/22 0337  WBC 9.6  --   --  8.2 6.9 26.2* 34.0*  CREATININE 1.23  --   --  0.98 0.96 1.28* 1.39*  LATICACIDVEN 5.5* 2.6* 1.4  --   --   --   --     Estimated Creatinine Clearance: 63.4 mL/min (A) (by C-G formula based on SCr of 1.39 mg/dL (H)).    No Known Allergies  Antimicrobials this admission: Cefepime 9/25 >>   Microbiology results: 9/25 BCx: NG < 12 hours 9/25 UCx: sent  9/25 Respiratory Panel: negative  9/26 MRSA PCR: sent   Thank you for allowing pharmacy to be a part of this patient's care.  Vicenta Dunning, PharmD  PGY1 Pharmacy Resident

## 2022-05-23 NOTE — Assessment & Plan Note (Addendum)
Continues to improve.Denies cough or SOB. Vitals stable and remained afebrile overnight.  Completed cefepime. - Continue to monitor vitals and fever curve

## 2022-05-23 NOTE — Plan of Care (Signed)
  Problem: Education: Goal: Knowledge of General Education information will improve Description Including pain rating scale, medication(s)/side effects and non-pharmacologic comfort measures Outcome: Progressing   

## 2022-05-23 NOTE — TOC Progression Note (Addendum)
Transition of Care [facility]) - Progression Note    Patient Details  Name: Seth [name] MRN: [id number] Date of Birth: Feb 25, 1956  Transition of Care [facility]) CM/SW [place], [place] Phone Number: 05/23/2022, 9:13 AM  Clinical Narrative:    Insurance approval received for [facility]: Ref# [id number], LMBE ID# [id number], effective through 05/24/22. [facility] able to accept patient today. Daughter requesting PTAR for transport. Per MD patient now not ready for discharge. Will continue to follow.    Expected Discharge Plan: [place] Barriers to Discharge: Continued Medical Work up  Expected Discharge Plan and Services Expected Discharge Plan: [place]   Discharge Planning Services: CM Consult                                           Social Determinants of Health (SDOH) Interventions Food Insecurity Interventions: Inpatient TOC Housing Interventions: Inpatient TOC Transportation Interventions: Intervention Not Indicated Utilities Interventions: Intervention Not Indicated  Readmission Risk Interventions     No data to display

## 2022-05-23 NOTE — Progress Notes (Addendum)
Daily Progress Note Intern Pager: [phone]  Patient name: Seth [name] Medical record number: [id number] Date of birth: [birth year] Age: 66 y.o. Gender: male  Primary Care Provider: Lucianne Lei, MD Consultants: Neurology Code Status: Full  Pt Overview and Major Events to Date:  9/22: Admitted to FMTS 9/25: Starting spiking fevers overnight 9/26: Treating for HAP  Assessment and Plan: Seth [name] is a 66 year old male presenting with acute onset of seizures likely in the setting of non-adherence. Pertinent PMH/PSH includes CVA with residual right-sided weakness, seizure disorder, alcohol use, tobacco use, gout, BPH and HTN.  * Seizures ([place]) Stable without seizure activity since admission.  -Phenobarbital 32.'4mg'$  AM and phenobarbital 64.'8mg'$  PM -Levetiracetam '1500mg'$  bid -Lacosamide '100mg'$  bid -Seizure precautions   HAP (hospital-acquired pneumonia) Patient started spiking fevers last night. New leukocytosis. CXR showed increased hazy airspace opacity. Treating for HAP. Last fever (100.9) at 1500 yesterday - Cefepime 2g q8h - Monitor fever curve  AKI (acute kidney injury) ([place]) Presumed mild prerenal AKI due to up trending creatinine.  - Gentle fluids  - NS 73m/hr for 8 hours  Multiple closed fractures of ribs of right side Persistent pain worse with cough, improved with Tylenol and cough drops.  - Oxycodone 2.'5mg'$  q4PRN  - Avoid NSAIDs (d/t plavix use) - Incentive Spirometry encouraged  History of CVA with residual RUE weakness Awaiting SNF dispo -Continue Plavix -Continue PT/OT   COPD (chronic obstructive pulmonary disease) ([facility] Documented history but does not have any home inhalers. Has persistent cough, worse at night. -[name]2 puffs  Tobacco use disorder -Nicotine patch PRN   Chronic alcoholism ([place] CIWAs have been 0 so have been discontinued. - Folic acid and thiamine supplement   FEN/GI: Heart healthy diet PPx: Lovenox Dispo:SNF pending  clinical improvement . Barriers include IV antibiotics.   Subjective:  Patient reports feeling chilly otherwise no other concerns or complaints.   Objective: Temp:  [98.7 F (37.1 C)-101.7 F (38.7 C)] 98.7 F (37.1 C) (09/26 0748) Pulse Rate:  [93-112] 94 (09/26 0748) Resp:  [17-24] 19 (09/26 0400) BP: (89-177)/(59-88) 112/66 (09/26 0400) SpO2:  [93 %-98 %] 96 % (09/26 0748) Physical Exam: General: Laying in bed comfortably. Alert and conversational  Cardiovascular: Tachycardic, no murmurs. Respiratory: Crackle heard bilaterally. Normal work of breathing on RA. Abdomen: Soft, non-distended. Tenderness near rib fractures Extremities: No BLE edema  Laboratory: Most recent CBC Lab Results  Component Value Date   WBC 34.0 (H) 05/23/2022   HGB 13.4 05/23/2022   HCT 41.7 05/23/2022   MCV 87.4 05/23/2022   PLT 188 05/23/2022   Most recent BMP    Latest Ref Rng & Units 05/23/2022    3:37 AM  BMP  Glucose 70 - 99 mg/dL 93   BUN 8 - 23 mg/dL 11   Creatinine 0.61 - 1.24 mg/dL 1.39   Sodium 135 - 145 mmol/L 133   Potassium 3.5 - 5.1 mmol/L 4.6   Chloride 98 - 111 mmol/L 101   CO2 22 - 32 mmol/L 22   Calcium 8.9 - 10.3 mg/dL 8.2    Imaging/Diagnostic Tests:  DG CHEST 2 VIEW IMPRESSION: Increased hazy airspace opacity in the region of the mildly displaced lower right rib fractures. Findings may represent atelectasis, but a component of superimposed infection is difficult to exclude.   YSandford Craze MS4 05/23/2022, 8:10 AM  Acting Intern, [place] pager: [phone] text pages welcome Secure chat group [place]  Resident attestation: I agree with the documentation of Student Dr. Jeanette Caprice above. I have made adjustments to her note as appropriate. I have seen the patient and performed physical exam on the patient consistent with her documented physical exam above.  Erskine Emery, MD

## 2022-05-23 NOTE — Assessment & Plan Note (Deleted)
Stable. - Consider gentle fluids if necessary

## 2022-05-24 DIAGNOSIS — J189 Pneumonia, unspecified organism: Secondary | ICD-10-CM

## 2022-05-24 DIAGNOSIS — R569 Unspecified convulsions: Secondary | ICD-10-CM | POA: Diagnosis not present

## 2022-05-24 DIAGNOSIS — F17200 Nicotine dependence, unspecified, uncomplicated: Secondary | ICD-10-CM | POA: Diagnosis not present

## 2022-05-24 DIAGNOSIS — R8271 Bacteriuria: Secondary | ICD-10-CM | POA: Diagnosis not present

## 2022-05-24 DIAGNOSIS — F1020 Alcohol dependence, uncomplicated: Secondary | ICD-10-CM | POA: Diagnosis not present

## 2022-05-24 DIAGNOSIS — Y95 Nosocomial condition: Secondary | ICD-10-CM

## 2022-05-24 LAB — CBC
HCT: 35.5 % — ABNORMAL LOW (ref 39.0–52.0)
Hemoglobin: 11.9 g/dL — ABNORMAL LOW (ref 13.0–17.0)
MCH: 28.7 pg (ref 26.0–34.0)
MCHC: 33.5 g/dL (ref 30.0–36.0)
MCV: 85.7 fL (ref 80.0–100.0)
Platelets: 161 10*3/uL (ref 150–400)
RBC: 4.14 MIL/uL — ABNORMAL LOW (ref 4.22–5.81)
RDW: 18.8 % — ABNORMAL HIGH (ref 11.5–15.5)
WBC: 28.4 10*3/uL — ABNORMAL HIGH (ref 4.0–10.5)
nRBC: 0 % (ref 0.0–0.2)

## 2022-05-24 LAB — BASIC METABOLIC PANEL
Anion gap: 6 (ref 5–15)
BUN: 18 mg/dL (ref 8–23)
CO2: 22 mmol/L (ref 22–32)
Calcium: 8.2 mg/dL — ABNORMAL LOW (ref 8.9–10.3)
Chloride: 106 mmol/L (ref 98–111)
Creatinine, Ser: 1.26 mg/dL — ABNORMAL HIGH (ref 0.61–1.24)
GFR, Estimated: >60 mL/min (ref >60)
Glucose, Bld: 84 mg/dL (ref 70–99)
Potassium: 3.5 mmol/L (ref 3.5–5.1)
Sodium: 134 mmol/L — ABNORMAL LOW (ref 135–145)

## 2022-05-24 LAB — MRSA NEXT GEN BY PCR, NASAL: MRSA by PCR Next Gen: NOT DETECTED

## 2022-05-24 LAB — URINE CULTURE: Culture: >=100000 — AB

## 2022-05-24 NOTE — Progress Notes (Signed)
Occupational Therapy Treatment Patient Details Name: Seth [name] MRN: [id number] DOB: 02/08/1956 Today's Date: 05/24/2022   History of present illness Pt adm 9/22 with seizure due to non adherence. Pt with rt side rib fx. PMH - CVA with rt residual weakness,SDH (5/22-likely traumatic) EtOH use, seizure disorder, L transmetatarsal amputation, rt rib fx's, polysubstance abuse, pvd, gout, copd.   OT comments  Pt making steady progress towards OT goals this session. Pt continues to present with decreased activity tolerance and generalized deconditioning. Pt currently requires min guard assist for ADL transfers with Rw, min guard assist for LB ADLs and min guard- supervision for standing grooming tasks at sink. Pt did endorse dizziness while standing at sink however BP WFL, pt on RA during session with pt reporting increased WOB with SpO2 91% but able to rebound to 98% with seated rest breaks and pursed lip breathing. Pt would continue to benefit from skilled occupational therapy while admitted and after d/c to address the below listed limitations in order to improve overall functional mobility and facilitate independence with BADL participation. DC plan remains appropriate, will follow acutely per POC.      Recommendations for follow up therapy are one component of a multi-disciplinary discharge planning process, led by the attending physician.  Recommendations may be updated based on patient status, additional functional criteria and insurance authorization.    Follow Up Recommendations  Skilled nursing-short term rehab (<3 hours/day)    Assistance Recommended at Discharge Frequent or constant Supervision/Assistance  Patient can return home with the following  A little help with walking and/or transfers;A little help with bathing/dressing/bathroom;Assistance with cooking/housework;Assist for transportation;Direct supervision/assist for financial management;Direct supervision/assist for medications  management   Equipment Recommendations  None recommended by OT    Recommendations for Other Services      Precautions / Restrictions Precautions Precautions: Fall Precaution Comments: seizure precs Restrictions Weight Bearing Restrictions: No       Mobility Bed Mobility Overal bed mobility: Needs Assistance Bed Mobility: Supine to Sit     Supine to sit: Min assist     General bed mobility comments: MIN A only to scoot R hip to EOB    Transfers Overall transfer level: Needs assistance Equipment used: Rolling [place] (2 wheels) Transfers: Sit to/from Stand Sit to Stand: Min assist, Mod assist, From elevated surface           General transfer comment: MIN A to rise from elevated EOB, MOD A to rise from low chari with no arm rests, pt presents with flexed posture during transitional movements making it difficult to achieve full anterior weight shift to power into standing     Balance Overall balance assessment: Needs assistance Sitting-balance support: No upper extremity supported, Feet supported Sitting balance-[name] Scale: Fair     Standing balance support: No upper extremity supported, Single extremity supported Standing balance-[name] Scale: Fair Standing balance comment: able to complete grooming tasks at sink with unilateral support and no UE support with no LOB and min guard                           ADL either performed or assessed with clinical judgement   ADL Overall ADL's : Needs assistance/impaired     Grooming: Wash/dry face;Standing;Min guard;Supervision/safety Grooming Details (indicate cue type and reason): standing at sink with no UE support     Lower Body Bathing: Min guard;Sit to/from stand Lower Body Bathing Details (indicate cue type and reason): standing at  sink to wash buttock Upper Body Dressing : Set up;Sitting Upper Body Dressing Details (indicate cue type and reason): to don posterior gown     Toilet Transfer: Minimal  assistance;Ambulation;Rolling [place] (2 wheels) Toilet Transfer Details (indicate cue type and reason): simulated via functional mobility iwth Rw Toileting- Clothing Manipulation and Hygiene: Min guard;Sit to/from stand Toileting - Clothing Manipulation Details (indicate cue type and reason): unilateral support on RW     Functional mobility during ADLs: Min guard;Minimal assistance;Rolling [place] (2 wheels) General ADL Comments: ADL participation limited by decreased activity tolerance and generalized deconditioning    Extremity/Trunk Assessment Upper Extremity Assessment Upper Extremity Assessment: Generalized weakness   Lower Extremity Assessment Lower Extremity Assessment: Defer to PT evaluation   Cervical / Trunk Assessment Cervical / Trunk Assessment: Kyphotic    Vision Patient Visual Report: No change from baseline     Perception Perception Perception: Within Functional Limits   Praxis Praxis Praxis: Intact    Cognition Arousal/Alertness: Awake/alert Behavior During Therapy: WFL for tasks assessed/performed Overall Cognitive Status: No family/caregiver present to determine baseline cognitive functioning                                 General Comments: following commands, disorineted to day of week but able to problem solve with cues, good awareness into deficits        Exercises General Exercises - Lower Extremity Ankle Circles/Pumps: Strengthening, Both, 10 reps, Supine    Shoulder Instructions       General Comments pt on RA Spo2 did drop to 91% with reports of SOB ( recovers to 98% with seated rest break), pt reprots feeling "dizzy" while standing at sink, BP from sitting 122/78( 90) HR 93 RR 33, education provided on pursed lip breathing and briefly introduced energy conservation strategies    Pertinent Vitals/ Pain       Pain Assessment Pain Assessment: No/denies pain  Home Living                                           Prior Functioning/Environment              Frequency  Min 2X/week        Progress Toward Goals  OT Goals(current goals can now be found in the care plan section)  Progress towards OT goals: Progressing toward goals  Acute Rehab OT Goals Patient Stated Goal: go to rehab OT Goal Formulation: With patient Time For Goal Achievement: 06/09/22 Potential to Achieve Goals: Good  Plan Discharge plan remains appropriate;Frequency remains appropriate    Co-evaluation                 AM-PAC OT "6 Clicks" Daily Activity     Outcome Measure   Help from another person eating meals?: None Help from another person taking care of personal grooming?: A Little Help from another person toileting, which includes using toliet, bedpan, or urinal?: A Little Help from another person bathing (including washing, rinsing, drying)?: A Little Help from another person to put on and taking off regular upper body clothing?: None Help from another person to put on and taking off regular lower body clothing?: A Little 6 Click Score: 20    End of Session Equipment Utilized During Treatment: Gait belt;Rolling [place] (2 wheels)  OT Visit Diagnosis: Unsteadiness on  feet (R26.81);Muscle weakness (generalized) (M62.81);Pain   Activity Tolerance Patient tolerated treatment well   Patient Left in chair;with call bell/phone within reach;with chair alarm set   Nurse Communication Mobility status;Other (comment) (via secure chat)        Time: 8138-8719 OT Time Calculation (min): 26 min  Charges: OT General Charges $OT Visit: 1 Visit OT Treatments $Self Care/Home Management : 23-37 mins  Harley Alto., COTA/L Acute Rehabilitation Services [phone]   Precious Haws 05/24/2022, 11:04 AM

## 2022-05-24 NOTE — Assessment & Plan Note (Deleted)
Urine culture grew pseudomonas.  - Cefepime 2g q8h  - Narrow antibiotics

## 2022-05-24 NOTE — Progress Notes (Addendum)
Daily Progress Note Intern Pager: [phone]  Patient name: Seth [name] Medical record number: [id number] Date of birth: [birth year] Age: 66 y.o. Gender: male  Primary Care Provider: Lucianne Lei, MD Consultants: Neurology Code Status: Full  Pt Overview and Major Events to Date:  9/22: Admitted to FMTS 9/25: Starting spiking fevers overnight 9/26: Treating for HAP  Assessment and Plan: Barbaraann Rondo B. Bessey is a 66 year old male presenting with acute onset of seizures likely in the setting of non-adherence. Hospital course complicated by fevers, found to have cough and airspace disease consistent with HAP. Pertinent PMH/PSH includes CVA with residual right-sided weakness, seizure disorder, alcohol use, tobacco use, gout, BPH and HTN.  HAP (hospital-acquired pneumonia) Afebrile overnight. Treating for HCAP. - Cefepime 2g q8h (day3/5) - MRSA swab sent, continue vancomycin until returns. Can discontinue if negative  - Monitor fever curve  * Seizures ([facility]) Stable.  -Phenobarbital 32.'4mg'$  AM and phenobarbital 64.'8mg'$  PM -Levetiracetam '1500mg'$  bid -Lacosamide '100mg'$  bid -Seizure precautions  AKI (acute kidney injury) ([place]) Creatinine improving.  - Consider gentle fluids if necessary  Multiple closed fractures of ribs of right side Stable. - Oxycodone 2.'5mg'$  q4PRN  - Avoid NSAIDs (d/t plavix use) - Incentive Spirometry encouraged  History of CVA with residual RUE weakness Awaiting SNF dispo -Continue Plavix -Continue PT/OT   COPD (chronic obstructive pulmonary disease) ([facility]) Stable. [name] 2 puffs  Asymptomatic bacteriuria Urine culture grew pseudomonas.Denies any urinary symptoms today.   Tobacco use disorder -Nicotine patch PRN   Chronic alcoholism ([place]) CIWAs have been 0 so have been discontinued. - Folic acid and thiamine supplement   FEN/GI: Heart healthy diet PPx: Lovenox Dispo:SNF pending clinical improvement . Barriers include IV antibiotics.    Subjective:  Patient feeling better this morning. His cough has improved and he is in no acute distress.  Objective: Temp:  [98.1 F (36.7 C)-99.5 F (37.5 C)] 98.6 F (37 C) (09/27 0347) Pulse Rate:  [77-93] 77 (09/27 1037) Resp:  [17-20] 20 (09/27 1037) BP: (108-122)/(64-78) 122/78 (09/27 1037) SpO2:  [91 %-100 %] 91 % (09/27 1037) Physical Exam: General: Laying in bed comfortably. Alert and conversational  Cardiovascular: Regular rate and rhythm. no murmurs. Respiratory: CTAB. Normal work of breathing on RA. Abdomen: Soft, non-distended. Tenderness near rib fractures Extremities: No BLE edema  Laboratory: Most recent CBC Lab Results  Component Value Date   WBC 28.4 (H) 05/24/2022   HGB 11.9 (L) 05/24/2022   HCT 35.5 (L) 05/24/2022   MCV 85.7 05/24/2022   PLT 161 05/24/2022   Most recent BMP    Latest Ref Rng & Units 05/24/2022    4:48 AM  BMP  Glucose 70 - 99 mg/dL 84   BUN 8 - 23 mg/dL 18   Creatinine 0.61 - 1.24 mg/dL 1.26   Sodium 135 - 145 mmol/L 134   Potassium 3.5 - 5.1 mmol/L 3.5   Chloride 98 - 111 mmol/L 106   CO2 22 - 32 mmol/L 22   Calcium 8.9 - 10.3 mg/dL 8.2    Imaging/Diagnostic Tests: no new imaging  [name], Medical Student 05/24/2022, 11:30 AM  Acting Intern, [place] Intern pager: [phone], text pages welcome Secure chat group [place] Upper-Level Resident Addendum   I have independently interviewed and examined the patient. I have discussed the above with the original author and agree with their documentation. My edits for correction/addition/clarification are in within the  document. Please see also any attending notes.   Rise Patience, DO  PGY-3, [facility] 05/24/2022 12:00 PM  [place] Service pager: [phone] (text pages welcome through [facility])

## 2022-05-24 NOTE — TOC Progression Note (Signed)
Transition of Care [facility]) - Progression Note    Patient Details  Name: Seth [name] MRN: [id number] Date of Birth: 04/18/1956  [facility] CM/SW [place], LCSW Phone Number: 05/24/2022, 1:14 PM  Clinical Narrative:    CSW provided updated to patient's daughter.    Expected Discharge Plan: [place] Barriers to Discharge: Continued Medical Work up  Expected Discharge Plan and Services Expected Discharge Plan: [place]   Discharge Planning Services: CM Consult                                           Social Determinants of Health (SDOH) Interventions Food Insecurity Interventions: Inpatient TOC Housing Interventions: Inpatient TOC Transportation Interventions: Intervention Not Indicated Utilities Interventions: Intervention Not Indicated  Readmission Risk Interventions     No data to display

## 2022-05-24 NOTE — Assessment & Plan Note (Deleted)
Urine culture grew pseudomonas.

## 2022-05-25 LAB — BASIC METABOLIC PANEL
Anion gap: 15 (ref 5–15)
BUN: 15 mg/dL (ref 8–23)
CO2: 20 mmol/L — ABNORMAL LOW (ref 22–32)
Calcium: 9.1 mg/dL (ref 8.9–10.3)
Chloride: 100 mmol/L (ref 98–111)
Creatinine, Ser: 1.01 mg/dL (ref 0.61–1.24)
GFR, Estimated: >60 mL/min (ref >60)
Glucose, Bld: 92 mg/dL (ref 70–99)
Potassium: 3.6 mmol/L (ref 3.5–5.1)
Sodium: 135 mmol/L (ref 135–145)

## 2022-05-25 LAB — CBC
HCT: 33.3 % — ABNORMAL LOW (ref 39.0–52.0)
Hemoglobin: 11.1 g/dL — ABNORMAL LOW (ref 13.0–17.0)
MCH: 28.2 pg (ref 26.0–34.0)
MCHC: 33.3 g/dL (ref 30.0–36.0)
MCV: 84.5 fL (ref 80.0–100.0)
Platelets: 184 10*3/uL (ref 150–400)
RBC: 3.94 MIL/uL — ABNORMAL LOW (ref 4.22–5.81)
RDW: 18 % — ABNORMAL HIGH (ref 11.5–15.5)
WBC: 20.9 10*3/uL — ABNORMAL HIGH (ref 4.0–10.5)
nRBC: 0 % (ref 0.0–0.2)

## 2022-05-25 NOTE — Plan of Care (Signed)

## 2022-05-25 NOTE — Progress Notes (Addendum)
Daily Progress Note Intern Pager: [phone]  Patient name: Seth [name] Medical record number: [id number] Date of birth: 02-15-1956 Age: 66 y.o. Gender: male  Primary Care Provider: Lucianne Lei, MD Consultants: Neurology Code Status: Full  Pt Overview and Major Events to Date:  9/22: Admitted to FMTS 9/25: Starting spiking fevers overnight 9/26: Treating for HAP  Assessment and Plan: Seth [name] is a 66 year old male presenting with acute onset of seizures likely in the setting of non-adherence. Hospital course complicated by fevers, found to have cough and airspace disease consistent with HAP. Pertinent PMH/PSH includes CVA with residual right-sided weakness, seizure disorder, alcohol use, tobacco use, gout, BPH and HTN.  * HAP (hospital-acquired pneumonia) Afebrile overnight. Treating for HAP. - Cefepime 2g q8h (day 4/5) - Monitor fever curve  Seizures ([facility]) Stable.  -Phenobarbital 32.'4mg'$  AM and phenobarbital 64.'8mg'$  PM -Levetiracetam '1500mg'$  bid -Lacosamide '100mg'$  bid -Seizure precautions   AKI (acute kidney injury) ([place]) Creatinine improving.  - Consider gentle fluids if necessary  Multiple closed fractures of ribs of right side Stable. - Oxycodone 2.'5mg'$  q4PRN  - Avoid NSAIDs (d/t plavix use) - Incentive Spirometry encouraged  History of CVA with residual RUE weakness Awaiting SNF dispo -Continue Plavix -Continue PT/OT   COPD (chronic obstructive pulmonary disease) ([facility]) Stable. Seth [name] 2 puffs  Asymptomatic bacteriuria Urine culture grew pseudomonas.  Tobacco use disorder -Nicotine patch PRN   Chronic alcoholism ([name]) CIWAs have been 0 so have been discontinued. - Folic acid and thiamine supplement   FEN/GI: Heart healthy diet PPx: Lovenox Dispo:SNF pending clinical improvement . Barriers include IV antibiotics.   Subjective:  Patient in no acute distress. He has no questions or complaints this morning.   Objective: Temp:  [98 F  (36.7 C)-98.9 F (37.2 C)] 98.1 F (36.7 C) (09/28 0831) Pulse Rate:  [75-80] 77 (09/28 0831) Resp:  [16-20] 17 (09/28 0300) BP: (117-135)/(67-78) 124/77 (09/28 0300) SpO2:  [91 %-96 %] 96 % (09/28 0831) Physical Exam: General: Laying in bed comfortably. Alert and conversational  Cardiovascular: Regular rate and rhythm. no murmurs. Respiratory: CTAB. Normal work of breathing on RA. Abdomen: Soft, non-distended. Tenderness near rib fractures Extremities: No BLE edema  Laboratory: Most recent CBC Lab Results  Component Value Date   WBC 20.9 (H) 05/25/2022   HGB 11.1 (L) 05/25/2022   HCT 33.3 (L) 05/25/2022   MCV 84.5 05/25/2022   PLT 184 05/25/2022   Most recent BMP    Latest Ref Rng & Units 05/25/2022    4:49 AM  BMP  Glucose 70 - 99 mg/dL 92   BUN 8 - 23 mg/dL 15   Creatinine 0.61 - 1.24 mg/dL 1.01   Sodium 135 - 145 mmol/L 135   Potassium 3.5 - 5.1 mmol/L 3.6   Chloride 98 - 111 mmol/L 100   CO2 22 - 32 mmol/L 20   Calcium 8.9 - 10.3 mg/dL 9.1     Imaging/Diagnostic Tests: No new imaging  [name], MS4 05/25/2022, 8:53 AM  Acting Intern, [place] Intern pager: [phone], text pages welcome Secure chat group [place] Service    Resident attestation: I agree with the documentation of Student Dr. Jeanette Caprice above. I have made adjustments to her note as appropriate. I have seen the patient and performed physical exam on the patient consistent with her documented physical exam above.   Day 4/5 cefepime, may be eligible for discharge back to SNF if stable tomorrow after  abx are completed.   Erskine Emery, MD

## 2022-05-25 NOTE — Progress Notes (Addendum)
Physical Therapy Treatment Patient Details Name: Seth [name] MRN: [id number] DOB: [birth year] Today's Date: 05/25/2022   History of Present Illness Pt adm 9/22 with seizure due to non adherence. Pt with rt side rib fx. PMH - CVA with rt residual weakness,SDH (5/22-likely traumatic) EtOH use, seizure disorder, L transmetatarsal amputation, rt rib fx's, polysubstance abuse, pvd, gout, copd.    PT Comments    Pt received in supine, agreeable to therapy with emphasis on transfer and gait training. He demonstrates improved activity tolerance and needing up to [place] for transfers and up to minA for household distance gait trials with RW support and for bed mobility using hospital bed features. Pt reports moderate fatigue ~5/10 modified RPE at end of session. Pt agreeable to sit up in recliner at end of session, chair alarm on for safety given pt increased fall risk, reinforced use of call bell. Pt continues to benefit from PT services to progress toward functional mobility goals.   Recommendations for follow up therapy are one component of a multi-disciplinary discharge planning process, led by the attending physician.  Recommendations may be updated based on patient status, additional functional criteria and insurance authorization.  Follow Up Recommendations  Skilled nursing-short term rehab (<3 hours/day) Can patient physically be transported by private vehicle: Yes   Assistance Recommended at Discharge Frequent or constant Supervision/Assistance  Patient can return home with the following A little help with walking and/or transfers;Help with stairs or ramp for entrance;Assist for transportation;Direct supervision/assist for medications management;A little help with bathing/dressing/bathroom   Equipment Recommendations  None recommended by PT    Recommendations for Other Services       Precautions / Restrictions Precautions Precautions: Fall Precaution Comments: seizure  precs Restrictions Weight Bearing Restrictions: No     Mobility  Bed Mobility Overal bed mobility: Needs Assistance Bed Mobility: Supine to Sit, Rolling Rolling: Min guard   Supine to sit: Min assist     General bed mobility comments: MIN A only to scoot R hip to EOB when HOB elevated and pt pulling on bed rail; rolling to L/R sides for removal/replacement of briefs    Transfers Overall transfer level: Needs assistance Equipment used: Rolling [place] (2 wheels) Transfers: Sit to/from Stand Sit to Stand: Min assist, Mod assist, From elevated surface           General transfer comment: MinA to rise from elevated EOB, Min to [place] to rise from low chair using arm rests, pt presents with flexed posture during transitional movements making it difficult to achieve full anterior weight shift to power into standing, does better with mod cues and use of momentum strategy to increased anterior weight shift with lift assist needed each attempt; x3 reciprocal reps from chair for strengthening and 6 reps total from different surfaces    Ambulation/Gait Ambulation/Gait assistance: Min assist Gait Distance (Feet): 100 Feet (x2 trials with break) Assistive device: Rolling [place] (2 wheels) Gait Pattern/deviations: Step-through pattern, Decreased stride length, Trunk flexed, Step-to pattern Gait velocity: decr     General Gait Details: Assist for balance and support, cues for activity pacing/safe RW use; forward head/rounded shoulders posture throughout       Balance Overall balance assessment: Needs assistance Sitting-balance support: No upper extremity supported, Feet supported Sitting balance-[name] Scale: Fair     Standing balance support: Single extremity supported Standing balance-[name] Scale: Fair Standing balance comment: min guard for static standing with U UE support  Cognition Arousal/Alertness: Awake/alert Behavior During Therapy:  WFL for tasks assessed/performed Overall Cognitive Status: No family/caregiver present to determine baseline cognitive functioning                                 General Comments: Following commands, pt able to problem solve with cues and increased time to process, good awareness into deficits and able to indicate when he needs briefs changed.        Exercises Other Exercises Other Exercises: STS x 3 reciprocal reps Other Exercises: seated BLE AROM: LAQ, ankle pumps x10 reps ea    General Comments General comments (skin integrity, edema, etc.): VSS on RA      Pertinent Vitals/Pain Pain Assessment Pain Assessment: No/denies pain Pain Intervention(s): Monitored during session, Repositioned           PT Goals (current goals can now be found in the care plan section) Acute Rehab PT Goals Patient Stated Goal: go back to rehab PT Goal Formulation: With patient Time For Goal Achievement: 06/03/22 Progress towards PT goals: Progressing toward goals    Frequency    Min 3X/week      PT Plan Current plan remains appropriate       AM-PAC PT "6 Clicks" Mobility   Outcome Measure  Help needed turning from your back to your side while in a flat bed without using bedrails?: A Little Help needed moving from lying on your back to sitting on the side of a flat bed without using bedrails?: A Little Help needed moving to and from a bed to a chair (including a wheelchair)?: A Lot Help needed standing up from a chair using your arms (e.g., wheelchair or bedside chair)?: A Lot Help needed to walk in hospital room?: A Little Help needed climbing 3-5 steps with a railing? : Total 6 Click Score: 14    End of Session Equipment Utilized During Treatment: Gait belt Activity Tolerance: Patient tolerated treatment well Patient left: in chair;with call bell/phone within reach;with chair alarm set Nurse Communication: Mobility status;Other (comment) (pt condom cath came off,  he needs a new one placed) PT Visit Diagnosis: Other abnormalities of gait and mobility (R26.89);Unsteadiness on feet (R26.81);History of falling (Z91.81)     Time: 8850-2774 PT Time Calculation (min) (ACUTE ONLY): 33 min  Charges:  $Gait Training: 8-22 mins $Therapeutic Activity: 8-22 mins                     Deshauna Cayson P., PTA Acute Rehabilitation Services Secure Chat Preferred 9a-5:30pm Office: [place] 05/25/2022, 1:40 PM

## 2022-05-25 NOTE — TOC Progression Note (Addendum)
Transition of Care [facility]) - Progression Note    Patient Details  Name: Seth [name] MRN: [id number] Date of Birth: 10-02-1955  Transition of Care [facility]) CM/SW [place], LCSW Phone Number: 05/25/2022, 11:25 AM  Clinical Narrative:    SNF insurance approval expires today. CSW will initiate new authorization process, Ref# [id number]. CSW updated patient's daughter.   Expected Discharge Plan: [place] Barriers to Discharge: Continued Medical Work up  Expected Discharge Plan and Services Expected Discharge Plan: [place]   Discharge Planning Services: CM Consult                                           Social Determinants of Health (SDOH) Interventions Food Insecurity Interventions: Inpatient TOC Housing Interventions: Inpatient TOC Transportation Interventions: Intervention Not Indicated Utilities Interventions: Intervention Not Indicated  Readmission Risk Interventions     No data to display

## 2022-05-26 ENCOUNTER — Inpatient Hospital Stay (HOSPITAL_COMMUNITY): Payer: Medicare Other

## 2022-05-26 LAB — CBC
HCT: 34.3 % — ABNORMAL LOW (ref 39.0–52.0)
Hemoglobin: 11.6 g/dL — ABNORMAL LOW (ref 13.0–17.0)
MCH: 28.6 pg (ref 26.0–34.0)
MCHC: 33.8 g/dL (ref 30.0–36.0)
MCV: 84.5 fL (ref 80.0–100.0)
Platelets: 230 10*3/uL (ref 150–400)
RBC: 4.06 MIL/uL — ABNORMAL LOW (ref 4.22–5.81)
RDW: 17.8 % — ABNORMAL HIGH (ref 11.5–15.5)
WBC: 16.5 10*3/uL — ABNORMAL HIGH (ref 4.0–10.5)
nRBC: 0 % (ref 0.0–0.2)

## 2022-05-26 NOTE — Progress Notes (Addendum)
     Daily Progress Note Intern Pager: [phone]  Patient name: Seth [name] Medical record number: [id number] Date of birth: 24-Jun-1956 Age: 66 y.o. Gender: male  Primary Care Provider: Lucianne Lei, MD Consultants: Neurology Code Status: Full  Pt Overview and Major Events to Date:  9/22: Admitted to [facility]MTS 9/25: Starting spiking fevers overnight 9/26: Treating for HAP  Assessment and Plan: Barbaraann Rondo B. Shutes is a 66 year old male presenting with acute onset of seizures likely in the setting of non-adherence. Hospital course complicated by fevers, found to have cough and airspace disease consistent with HAP. Pertinent PMH/PSH includes CVA with residual right-sided weakness, seizure disorder, alcohol use, tobacco use, gout, BPH and HTN.  * HAP (hospital-acquired pneumonia) Afebrile overnight. Treating for HCAP. - Cefepime 2g q8h (will finish course on 9/30) - Monitor fever curve  Seizures (HCC) Stable.  -Phenobarbital 32.'4mg'$  AM and phenobarbital 64.'8mg'$  PM -Levetiracetam '1500mg'$  bid -Lacosamide '100mg'$  bid -Seizure precautions   Multiple closed fractures of ribs of right side Stable. - Oxycodone 2.'5mg'$  q4PRN  - Avoid NSAIDs (d/t plavix use) - Incentive Spirometry encouraged  History of CVA with residual RUE weakness Awaiting SNF dispo -Continue Plavix -Continue PT/OT   COPD (chronic obstructive pulmonary disease) (HCC) Stable. [name] 2 puffs   FEN/GI: Heart healthy diet PPx: Lovenox Dispo:SNF tomorrow. Barriers include IV antibiotics and prior authorization.   Subjective:  Patient really happy to hear that today is his last course of antibiotics. He is feeling a lot better and has no questions or concerns this morning.  Objective: Temp:  [97.9 F (36.6 C)-98.6 F (37 C)] 98.5 F (36.9 C) (09/29 0800) Pulse Rate:  [68-79] 68 (09/29 0800) Resp:  [16-23] 23 (09/29 0800) BP: (114-146)/(66-82) 125/66 (09/29 0800) SpO2:  [95 %-100 %] 100 % (09/29 0846) Physical  Exam: General: Laying in bed comfortably. Alert and conversational  Cardiovascular: Regular rate and rhythm. no murmurs. Respiratory: CTAB. Normal work of breathing on RA. Abdomen: Soft, non-distended. Tenderness near rib fractures Extremities: No BLE edema  Laboratory: Most recent CBC Lab Results  Component Value Date   WBC 16.5 (H) 05/26/2022   HGB 11.6 (L) 05/26/2022   HCT 34.3 (L) 05/26/2022   MCV 84.5 05/26/2022   PLT 230 05/26/2022   Most recent BMP    Latest Ref Rng & Units 05/25/2022    4:49 AM  BMP  Glucose 70 - 99 mg/dL 92   BUN 8 - 23 mg/dL 15   Creatinine 0.61 - 1.24 mg/dL 1.01   Sodium 135 - 145 mmol/L 135   Potassium 3.5 - 5.1 mmol/L 3.6   Chloride 98 - 111 mmol/L 100   CO2 22 - 32 mmol/L 20   Calcium 8.9 - 10.3 mg/dL 9.1     Imaging/Diagnostic Tests: no new imaging  [name], Medical Student 05/26/2022, 8:00 AM  Acting Intern, [place] Intern pager: [phone], text pages welcome Secure chat group [place]
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
## 2022-05-26 NOTE — Care Management Important Message (Signed)
Important Message  Patient Details  Name: Seth [name] MRN: [id number] Date of Birth: 08-Mar-1956   Medicare Important Message Given:  Yes     [name] 05/26/2022, 3:42 PM

## 2022-05-26 NOTE — Plan of Care (Signed)
  Problem: Education: Goal: Knowledge of General Education information will improve Description Including pain rating scale, medication(s)/side effects and non-pharmacologic comfort measures Outcome: Progressing   

## 2022-05-26 NOTE — TOC Progression Note (Addendum)
Transition of Care [facility]) - Progression Note    Patient Details  Name: Seth [name] MRN: [id number] Date of Birth: 12/25/1955  Transition of Care [facility]) CM/SW [place], LCSW Phone Number: 05/26/2022, 10:13 AM  Clinical Narrative:    New insurance approval received for [employer], Ref# [id number], effective 05/26/2022-05/30/2022.  SNF hoping for discharge tomorrow and will need DC Summary by 4pm.    Expected Discharge Plan: Skilled Nursing Facility Barriers to Discharge: Continued Medical Work up  Expected Discharge Plan and Services Expected Discharge Plan: [place]   Discharge Planning Services: CM Consult                                           Social Determinants of Health (SDOH) Interventions Food Insecurity Interventions: Inpatient TOC Housing Interventions: Inpatient TOC Transportation Interventions: Intervention Not Indicated Utilities Interventions: Intervention Not Indicated  Readmission Risk Interventions     No data to display

## 2022-05-27 LAB — CULTURE, BLOOD (ROUTINE X 2)
Culture: NO GROWTH
Culture: NO GROWTH
Special Requests: ADEQUATE
Special Requests: ADEQUATE

## 2022-05-27 MED ORDER — LACOSAMIDE 100 MG PO TABS: 100.0000 mg | ORAL_TABLET | Freq: Two times a day (BID) | ORAL | 0 refills | Status: DC

## 2022-05-27 MED ORDER — PHENOBARBITAL 32.4 MG PO TABS: 32.4000 mg | ORAL_TABLET | Freq: Every morning | ORAL | 1 refills | Status: DC

## 2022-05-27 MED ORDER — PHENOBARBITAL 64.8 MG PO TABS: 64.8000 mg | ORAL_TABLET | Freq: Every day | ORAL | Status: DC

## 2022-05-27 NOTE — Progress Notes (Signed)
I called to give report at [facility] and they advised they are unable to get seizure meds today or tomorrow.  Had me to call back in 20 minutes to after they were to speak with their DON.  Faxed the patient med list so [place] could see if their pharmacy could get the meds to them this evening.  Called them back and Janelle advised cannot get the meds from pharmacy until Monday morning.  Patient discharge was cancelled.

## 2022-05-27 NOTE — Plan of Care (Signed)

## 2022-05-27 NOTE — Discharge Summary (Addendum)
[facility] Discharge Summary  Patient name: Seth [name] Medical record number: [id number] Date of birth: 09-22-1955 Age: 66 y.o. Gender: male Date of Admission: 05/19/2022  Date of Discharge: 05/27/22 Admitting Physician: Erskine Emery, MD  Primary Care Provider: Lucianne Lei, MD Consultants: Neurology   Indication for Hospitalization: Seizure like activity   Discharge Diagnoses/Problem List:  Principal Problem for Admission: Seizures  Other Problems addressed during stay:  Principal Problem:   HAP (hospital-acquired pneumonia) Active Problems:   Seizures ([place])   Multiple closed fractures of ribs of right side   COPD (chronic obstructive pulmonary disease) ([place])   History of CVA with residual RUE weakness   Tobacco use disorder   Asymptomatic bacteriuria   Chronic alcoholism ([place])   Seizure [facility])    Brief Hospital Course:  Seth [name] is a 65 y.o. male presenting with acute onset of brief event of breakthrough seizure.  Seizures Patient has history of seizures and took Levetiracetam '1500mg'$  bid, lacosamide '100mg'$  bid, and phenobarbital 32.'4mg'$  bid at home. He has not been taking his anti-seizure medications for 1 month prior to admission but reported to [place] different timeframes regarding his medication noncompliance.  It appears that he left [facility] and did not continue taking his antiseizure medications which caused him to have a breakthrough seizure. Neurology was on board and recommended increasing nightly phenobarbital to '64mg'$ .  We continued with this and monitored mental status. He did well while inpatient and had no further seizures.  HAP Patient starting spiking fevers and had a leukocytosis. CXR showed increased hazy airspace opacity which was concerning for HAP. Patient completed 5 day course of Cefepime.   Hx of CVA Patient has residual right sided weakness due to history of CVA. Patient started on home dose of Plavix.  No new  CVA based on imaging.  Rib fractures Patient fell during seizure episode and had mildly displaced fractures of lateral right seventh and eighth ribs. Imaging also showed old, documented subacute fractures, see imaging impression for further details. Patient is satting well on room air and during hospitalization, pain was controlled with oxycodone as needed.  Chronic Alcoholism  Patient reports drinking 2-3 drinks of vodka daily. On folic acid and thiamine supplement.  Patient was monitored via CIWA protocol and did not need Ativan during his stay.  Issues to follow up  Recommend Vascular consult as outpatient for ascending thoracic aorta  Repeat CT Head 1-2 years to follow up on meningioma CT chest/abd/pelvis notable for ascending thoracic aorta measuring 4 cm in diameter, recommended annual imaging follow up by CTA or MRA.  Repeat CMP and CBC at follow up, normocytic anemia of unknown cause, may benefit from iron studies outpatient   Disposition: SNF   Discharge Condition: Stable    Discharge Exam:  Vitals:   05/27/22 0831 05/27/22 0919  BP:    Pulse:    Resp:    Temp:  98.2 F (36.8 C)  SpO2: 98%    Per Dr. Larae Grooms:  Physical Exam: General: Patient resting comfortably and awakens to my arrival, smiling and in no acute distress. Cardiovascular: RRR, no murmurs or gallops auscultated  Respiratory: CTAB, no wheezing, rales or rhonchi noted                   Abdomen: soft, nontender, nondistended, presence of bowel sounds Extremities: distal pulses strong and equal bilaterally, no LE edema noted bilaterally  Psych: mood appropriate, pleasant   Significant Procedures: None  Significant Labs and Imaging:  Recent Labs  Lab 05/26/22 0711  WBC 16.5*  HGB 11.6*  HCT 34.3*  PLT 230   No results for input(s): "NA", "K", "CL", "CO2", "GLUCOSE", "BUN", "CREATININE", "CALCIUM", "MG", "PHOS", "ALKPHOS", "AST", "ALT", "ALBUMIN", "PROTEIN" in the last 48 hours.  DG Knee 1-2 Views  Left  Result Date: 05/26/2022 CLINICAL DATA:  Left knee pain EXAM: LEFT KNEE - 1-2 VIEW COMPARISON:  02/19/2019 FINDINGS: Stent within the distal thigh and popliteal region. Probable fracture through the distal portion of the stent. No acute fracture or malalignment. Small knee effusion. IMPRESSION: Small knee effusion.  No acute osseous abnormality. Electronically Signed   By: Donavan Foil M.D.   On: 05/26/2022 18:14   DG Chest 2 View  Result Date: 05/22/2022 CLINICAL DATA:  Cough EXAM: CHEST - 2 VIEW COMPARISON:  CT chest 05/20/2019. FINDINGS: Redemonstrated mildly displaced right lower rib fractures. Compared to prior exam there is slight interval increase in hazy airspace opacity in the region the rib fractures. There is also likely slight asymmetric elevation of the right hemidiaphragm. No pneumothorax. Visualized upper abdomen is unremarkable. Cardiac and mediastinal contours are unchanged. IMPRESSION: Increased hazy airspace opacity in the region of the mildly displaced lower right rib fractures. Findings may represent atelectasis, but a component of superimposed infection is difficult to exclude. Electronically Signed   By: Marin Roberts M.D.   On: 05/22/2022 12:42   MR BRAIN WO CONTRAST  Result Date: 05/19/2022 CLINICAL DATA:  Follow-up examination for suspected stroke. EXAM: MRI HEAD WITHOUT CONTRAST TECHNIQUE: Multiplanar, multiecho pulse sequences of the brain and surrounding structures were obtained without intravenous contrast. COMPARISON:  Prior CT from earlier the same day as well as previous exams. FINDINGS: Brain: Advanced cerebral atrophy for age, most pronounced at the mesial left temporal lobe, and somewhat advanced as compared to prior brain MRI from 2022. Mild chronic microvascular ischemic disease noted involving the periventricular and deep white matter both cerebral hemispheres. Multiple remote bilateral cerebellar infarcts noted. Few additional chronic lacunar infarcts present  about the bilateral basal ganglia. No abnormal foci of restricted diffusion to suggest acute or subacute ischemia. Gray-white matter differentiation maintained. No acute intracranial hemorrhage. Chronic hemosiderin staining noted overlying the left cerebral convexity, suggesting prior hemorrhage at this location. 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 No other mass lesion or midline shift. Diffuse ventricular prominence related to global parenchymal volume loss, with the left lateral ventricle slightly larger than the right, similar to prior. No hydrocephalus. No extra-axial collection. Pituitary gland suprasellar region normal. Vascular: Major intracranial vascular flow voids are maintained. 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 Skull and upper cervical spine: Craniocervical junction within normal limits. Bone marrow signal intensity normal. Postsurgical changes partially visualize within the upper cervical spine. No scalp soft tissue abnormality. Sinuses/Orbits: Globes and orbital soft tissues within normal limits. Scattered mild-to-moderate mucosal thickening noted about the ethmoidal air cells and maxillary sinuses. No mastoid effusion. Other: None. IMPRESSION: 1. No acute intracranial infarct or other abnormality. 2. Age advanced cerebral atrophy with chronic small vessel ischemic disease, with multiple remote bilateral cerebellar infarcts. 3. 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 4. 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 Electronically Signed   By: Jeannine Boga M.D.   On: 05/19/2022 21:12   CT CHEST ABDOMEN PELVIS  WO CONTRAST  Result Date: 05/19/2022 CLINICAL DATA:  Seizure.  Rib fracture EXAM: CT CHEST, ABDOMEN AND PELVIS WITHOUT  CONTRAST TECHNIQUE: Multidetector CT imaging of the chest, abdomen and pelvis was performed following the standard protocol without IV contrast. RADIATION DOSE REDUCTION: This exam was performed according to the departmental dose-optimization program which includes automated exposure control, adjustment of the mA and/or kV according to patient size and/or use of iterative reconstruction technique. COMPARISON:  CT 02/19/2019, 07/13/2016 FINDINGS: CT CHEST FINDINGS Cardiovascular: Heart size is normal. No pericardial effusion. Mid ascending thoracic aorta measures up to 4.0 cm in diameter. Atherosclerotic calcifications of the aorta and coronary arteries. Main pulmonary artery measures 3.1 cm in diameter. Mediastinum/Nodes: No enlarged mediastinal, hilar, or axillary lymph nodes. Thyroid gland, trachea, and esophagus demonstrate no significant findings. Lungs/Pleura: 6 mm nodule abutting the pleural surface within the posterior aspect of the right upper lobe, stable from 2017 and benign. Band-like opacities within the bilateral lower lobes. Mild centrilobular emphysema. No pleural effusion or pneumothorax. Musculoskeletal: Acute mildly displaced fracture of the lateral right eighth rib. Acute minimally displaced fracture of the lateral right seventh rib. Subacute healing fractures of the anterior right fourth, fifth, and sixth ribs. Subtle superior endplate depression at T5 appears nonacute, however is new from 2020. Exaggerated thoracic kyphosis. No chest wall hematoma or subcutaneous emphysema. CT ABDOMEN PELVIS FINDINGS Hepatobiliary: Unremarkable unenhanced appearance of the liver without evidence of hepatic injury or perihepatic hematoma. Unremarkable gallbladder. No hyperdense gallstone. No biliary dilatation. Pancreas: Unremarkable. No pancreatic ductal dilatation or surrounding inflammatory changes. Spleen: Normal in size without focal abnormality. Adrenals/Urinary Tract: No adrenal hemorrhage or renal injury  identified. No renal stone or hydronephrosis. Bladder is unremarkable. Stomach/Bowel: Stomach is within normal limits. Scattered colonic diverticulosis. No evidence of bowel wall thickening, distention, or inflammatory changes. Vascular/Lymphatic: Aortic atherosclerosis. No enlarged abdominal or pelvic lymph nodes. Reproductive: Prostate is unremarkable. Other: No free fluid. No abdominopelvic fluid collection. No pneumoperitoneum. No abdominal wall hernia. Musculoskeletal: No acute bony findings. Lumbar vertebral body heights and alignment are maintained. Pelvic bony ring intact without fracture or diastasis. Hip joints are intact without dislocation. No soft tissue hematoma. IMPRESSION: 1. Acute mildly displaced fractures of the lateral right seventh and eighth ribs. No pneumothorax. 2. Subacute healing fractures of the anterior right fourth, fifth, and sixth ribs. 3. Subtle superior endplate depression at T5 appears nonacute, however is new from 2020. Correlate with point tenderness. 4. Band-like opacities within the bilateral lower lobes, likely atelectasis. 5. No acute abdominopelvic findings. 6. Ascending thoracic aorta measuring 4.0 cm in diameter. Recommend annual imaging followup by CTA or MRA. This recommendation follows 2010 ACCF/AHA/AATS/ACR/ASA/SCA/SCAI/SIR/STS/SVM Guidelines for the Diagnosis and Management of Patients with Thoracic Aortic Disease. Circulation. 2010; 121: Z610-R604. Aortic aneurysm NOS (ICD10-I71.9) 7. Aortic and coronary artery atherosclerosis (ICD10-I70.0). Electronically Signed   By: Davina Poke D.O.   On: 05/19/2022 11:00   CT Cervical Spine Wo Contrast  Result Date: 05/19/2022 CLINICAL DATA:  Unwitnessed fall EXAM: CT CERVICAL SPINE WITHOUT CONTRAST TECHNIQUE: Multidetector CT imaging of the cervical spine was performed without intravenous contrast. Multiplanar CT image reconstructions were also generated. RADIATION DOSE REDUCTION: This exam was performed according to the  departmental dose-optimization program which includes automated exposure control, adjustment of the mA and/or kV according to patient size and/or use of iterative reconstruction technique. COMPARISON:  None Available. FINDINGS: Alignment: Straightening of the normal cervical lordosis. There is mild retrolisthesis of C5 on C6. Skull base and vertebrae: No acute fracture. No  primary bone lesion or focal pathologic process. There is solid osseous fusion at C3-C4 after ACDF. There is no evidence hardware complications or findings to suggest loosening. Soft tissues and spinal canal: No prevertebral fluid or swelling. No visible canal hematoma. There are atherosclerotic calcifications of the bilateral carotid bifurcations. Disc levels:  No high-grade spinal canal stenosis. Upper chest: Negative. Please see separately dictated CT chest abdomen and pelvis. Other: 8 mm hypodense lesion in the perimandibular subcutaneous soft tissues (series 3, image 36) likely a small inclusion cyst. IMPRESSION: No evidence of cervical spine fracture. Electronically Signed   By: Marin Roberts M.D.   On: 05/19/2022 10:58   CT Head Wo Contrast  Result Date: 05/19/2022 CLINICAL DATA:  Unwitnessed fall.  Witnessed seizure. EXAM: CT HEAD WITHOUT CONTRAST TECHNIQUE: Contiguous axial images were obtained from the base of the skull through the vertex without intravenous contrast. RADIATION DOSE REDUCTION: This exam was performed according to the departmental dose-optimization program which includes automated exposure control, adjustment of the mA and/or kV according to patient size and/or use of iterative reconstruction technique. COMPARISON:  CT head and neck angiogram 05/18/2021 FINDINGS: Brain: No evidence of acute infarction, hemorrhage, hydrocephalus, extra-axial collection or mass lesion/mass effect. Redemonstrated 9 x 5 mm extra-axial lesion along the false, unchanged compared to 05/18/2021 CTA head and neck angiogram, and favored to  represent a meningioma. There is marked cerebellar volume loss. There are chronic bilateral cerebellar infarcts. Size and shape of the ventricular system is unchanged compared to prior exam with mild asymmetric enlargement of the left lateral ventricular system. Vascular: No hyperdense vessel or unexpected calcification. Skull: Normal. Negative for fracture or focal lesion. Sinuses/Orbits: Moderate mucosal thickening right maxillary sinus and mild mucosal thickening in the left maxillary sinus. There is pneumatization of the petrous apex on the left Other: None. IMPRESSION: 1. No CT evidence of acute intracranial abnormality. 2. Unchanged 1 cm right parafalcine meningioma. 3. Redemonstrated generalized cerebral and cerebellar volume loss with slight asymmetric enlargement of the left lateral ventricular system and asymmetric volume loss in the anterior left temporal lobe, unchanged from prior. Electronically Signed   By: Marin Roberts M.D.   On: 05/19/2022 10:47   DG Chest Portable 1 View  Result Date: 05/19/2022 CLINICAL DATA:  decreased breath sounds EXAM: PORTABLE CHEST 1 VIEW COMPARISON:  Radiograph 10/10/2021 FINDINGS: Low lung volumes with bibasilar opacities. Trace right pleural effusion. No evidence of pneumothorax. Cardiomediastinal silhouette is unchanged. New mildly displaced right posterolateral seventh rib fracture. IMPRESSION: New mildly displaced right posterolateral seventh rib fracture. Trace right pleural effusion. Low lung volumes with bibasilar opacities, likely atelectasis, infection possible. Electronically Signed   By: Maurine Simmering M.D.   On: 05/19/2022 09:09    Results/Tests Pending at Time of Discharge: None   Discharge Medications:  Allergies as of 05/27/2022   No Known Allergies      Medication List     STOP taking these medications    polyethylene glycol 17 g packet Commonly known as: MIRALAX / GLYCOLAX   senna-docusate 8.6-50 MG tablet Commonly known as: Senokot-S        TAKE these medications    acetaminophen 325 MG tablet Commonly known as: TYLENOL Take 2 tablets (650 mg total) by mouth every 6 (six) hours as needed for headache or mild pain.   clopidogrel 75 MG tablet Commonly known as: PLAVIX Take 1 tablet (75 mg total) by mouth daily.   cyanocobalamin 1000 MCG tablet Take 1 tablet (1,000 mcg total) by mouth  daily.   feeding supplement Liqd Take 237 mLs by mouth 3 (three) times daily between meals.   ferrous sulfate 325 (65 FE) MG tablet Take 1 tablet (325 mg total) by mouth 2 (two) times daily with a meal.   fluticasone-salmeterol 500-50 MCG/ACT Aepb Commonly known as: ADVAIR Inhale 1 puff into the lungs 2 (two) times daily.   folic acid 1 MG tablet Commonly known as: FOLVITE Take 1 tablet (1 mg total) by mouth daily.   gabapentin 100 MG capsule Commonly known as: NEURONTIN Take 100 mg by mouth 3 (three) times daily.   Incruse Ellipta 62.5 MCG/ACT Aepb Generic drug: umeclidinium bromide INHALE 1 PUFF INTO THE LUNGS DAILY   Lacosamide 100 MG Tabs Take 1 tablet (100 mg total) by mouth 2 (two) times daily.   levETIRAcetam 750 MG tablet Commonly known as: KEPPRA Take 2 tablets (1,500 mg total) by mouth 2 (two) times daily.   metoprolol tartrate 50 MG tablet Commonly known as: LOPRESSOR Take 1 tablet (50 mg total) by mouth 2 (two) times daily.   multivitamins ther. w/minerals Tabs tablet Take 1 tablet by mouth daily.   pantoprazole 40 MG tablet Commonly known as: PROTONIX Take 1 tablet (40 mg total) by mouth daily.   PHENobarbital 32.4 MG tablet Commonly known as: LUMINAL Take 1 tablet (32.4 mg total) by mouth in the morning. What changed: See the new instructions.   PHENobarbital 64.8 MG tablet Commonly known as: LUMINAL Take 1 tablet (64.8 mg total) by mouth at bedtime. What changed:  medication strength how much to take when to take this   rosuvastatin 10 MG tablet Commonly known as: CRESTOR Take 1  tablet (10 mg total) by mouth daily.   sertraline 25 MG tablet Commonly known as: ZOLOFT Take 25 mg by mouth daily.   thiamine 100 MG tablet Commonly known as: VITAMIN B1 Take 1 tablet (100 mg total) by mouth daily.   zinc sulfate 220 (50 Zn) MG capsule Take 1 capsule (220 mg total) by mouth daily.        Discharge Instructions: Please refer to Patient Instructions section of EMR for full details.  Patient was counseled important signs and symptoms that should prompt return to medical care, changes in medications, dietary instructions, activity restrictions, and follow up appointments.   Follow-Up Appointments: SNF placement    Erskine Emery, MD 05/27/2022, 12:19 PM PGY-2, [place]

## 2022-05-27 NOTE — Plan of Care (Signed)
  Problem: Education: Goal: Knowledge of General Education information will improve Description Including pain rating scale, medication(s)/side effects and non-pharmacologic comfort measures Outcome: Progressing   

## 2022-05-27 NOTE — Progress Notes (Signed)
Daily Progress Note Intern Pager: [phone]  Patient name: Seth [name] Medical record number: [id number] Date of birth: 1955-09-26 Age: 66 y.o. Gender: male  Primary Care Provider: Lucianne Lei, MD Consultants: Neurology  Code Status: Full   Pt Overview and Major Events to Date:  9/22: Admitted 9/25: Febrile 9/26: Initiated treatment for HCAP  Assessment and Plan: Seth [name] is a 66 year old male presenting with acute onset of seizures likely in the setting of non-adherence. Hospital course complicated by fevers, found to have cough and airspace disease consistent with HAP. Pertinent PMH/PSH includes CVA with residual right-sided weakness, seizure disorder, alcohol use, tobacco use, gout, BPH and HTN.  * HAP (hospital-acquired pneumonia) Improving. Vitals stable and remained afebrile overnight. - Completing course of cefepime today, last dose around 2pm  - Continue to monitor vitals and fever curve - Awaiting CBC this morning   Seizures (HCC) Chronic and stable. Neurology initially consulted and signed off.  -Continue Phenobarbital 32.'4mg'$  AM and phenobarbital 64.'8mg'$  PM -Continue Levetiracetam '1500mg'$  bid -Continue Vimpat '100mg'$  bid -Seizure precautions   Multiple closed fractures of ribs of right side Stable. Denies any pain or other related concerns this morning.  - Avoid NSAIDs (d/t plavix use) - Incentive Spirometry encouraged  History of CVA with residual RUE weakness Awaiting SNF dispo -Continue Plavix -Continue PT/OT   COPD (chronic obstructive pulmonary disease) (HCC) Chronic and stable. Respiratory status stable.  - Continur incruse ellipta  - Continue dulera         FEN/GI: heart healthy diet  PPx: lovenox  Dispo:SNF today. Barriers include completion of IV cefepime course.   Subjective:  No acute overnight events reports, remained afebrile throughout the night. Patient denies any concerns this morning.   Objective: Temp:  [98.3 F  (36.8 C)-98.8 F (37.1 C)] 98.4 F (36.9 C) (09/30 0400) Pulse Rate:  [64-74] 64 (09/30 0400) Resp:  [13-23] 13 (09/30 0400) BP: (123-148)/(63-85) 129/71 (09/30 0400) SpO2:  [98 %-100 %] 100 % (09/30 0400) Physical Exam: General: Patient resting comfortably and awakens to my arrival, smiling and in no acute distress. Cardiovascular: RRR, no murmurs or gallops auscultated  Respiratory: CTAB, no wheezing, rales or rhonchi noted   Abdomen: soft, nontender, nondistended, presence of bowel sounds Extremities: distal pulses strong and equal bilaterally, no LE edema noted bilaterally  Psych: mood appropriate, pleasant   Laboratory: Most recent CBC Lab Results  Component Value Date   WBC 16.5 (H) 05/26/2022   HGB 11.6 (L) 05/26/2022   HCT 34.3 (L) 05/26/2022   MCV 84.5 05/26/2022   PLT 230 05/26/2022   Most recent BMP    Latest Ref Rng & Units 05/25/2022    4:49 AM  BMP  Glucose 70 - 99 mg/dL 92   BUN 8 - 23 mg/dL 15   Creatinine 0.61 - 1.24 mg/dL 1.01   Sodium 135 - 145 mmol/L 135   Potassium 3.5 - 5.1 mmol/L 3.6   Chloride 98 - 111 mmol/L 100   CO2 22 - 32 mmol/L 20   Calcium 8.9 - 10.3 mg/dL 9.1       Imaging/Diagnostic Tests: DG Knee 1-2 Views Left  Result Date: 05/26/2022 CLINICAL DATA:  Left knee pain EXAM: LEFT KNEE - 1-2 VIEW COMPARISON:  02/19/2019 FINDINGS: Stent within the distal thigh and popliteal region. Probable fracture through the distal portion of the stent. No acute fracture or malalignment. Small knee effusion. IMPRESSION: Small knee effusion.  No acute osseous abnormality. Electronically Signed   By:  Donavan Foil M.D.   On: 05/26/2022 18:14     Donney Dice, DO 05/27/2022, 5:42 AM  PGY-3, [place] Intern pager: [phone], text pages welcome Secure chat group [place]

## 2022-05-27 NOTE — TOC Transition Note (Addendum)
Transition of Care [facility]) - CM/SW Discharge Note   Patient Details  Name: Seth [name] MRN: [id number] Date of Birth: 12-30-1955  [facility]) CM/SW Contact:  L[address]ta [address], [place] Phone Number: 05/27/2022, 2:56 PM   Clinical Narrative:     Patient will DC to: [facility] Anticipated DC date: 05/27/22 Family notified: Daughter-[name] Transport by: Corey Harold   Per MD patient ready for DC to [facility]. RN to call report prior to discharge [phone]. RN, patient, patient's family, and facility notified of DC. Discharge Summary and FL2 sent to facility. DC packet on chart. Ambulance transport requested for patient.   CSW will sign off for now as social work intervention is no longer needed. Please consult [place] again if new needs arise.        Barriers to Discharge: Continued Medical Work up   Patient Goals and CMS Choice Patient states their goals for this hospitalization and ongoing recovery are:: to get rehab      Discharge Placement                       Discharge Plan and Services   Discharge Planning Services: CM Consult                                 Social Determinants of Health (SDOH) Interventions Food Insecurity Interventions: Inpatient TOC Housing Interventions: Inpatient TOC Transportation Interventions: Intervention Not Indicated Utilities Interventions: Intervention Not Indicated   Readmission Risk Interventions     No data to display

## 2022-05-28 NOTE — Plan of Care (Signed)

## 2022-05-28 NOTE — Progress Notes (Signed)
     Daily Progress Note Intern Pager: [phone]  Patient name: Seth [name] Medical record number: [id number] Date of birth: 02-28-1956 Age: 66 y.o. Gender: male  Primary Care Provider: Lucianne Lei, MD Consultants: Neurology Code Status: Full  Pt Overview and Major Events to Date:  9/22: Admitted 9/25: Febrile 9/26: Initiated treatment for HCAP  Assessment and Plan: Seth [name] is a 66 year old male presenting with acute onset of seizures likely in the setting of medication non-adherence. Hospital course complicated by fevers, found to have cough and airspace disease consistent with HAP. Pertinent PMH/PSH includes CVA with residual right-sided weakness, seizure disorder, alcohol use, tobacco use, gout, BPH and HTN.  * Seizures (HCC) Chronic and stable. Neurology initially consulted and signed off.  -Continue Phenobarbital 32.'4mg'$  AM and phenobarbital 64.'8mg'$  PM -Continue Levetiracetam '1500mg'$  bid -Continue Vimpat '100mg'$  bid -Seizure precautions  Multiple closed fractures of ribs of right side Stable. Denies any pain or other related concerns this morning.  - Avoid NSAIDs (d/t plavix use) - Incentive Spirometry encouraged  HAP (hospital-acquired pneumonia) Improving. Vitals stable and remained afebrile overnight.  Completed cefepime. - Continue to monitor vitals and fever curve  Chronic, stable conditions: COPD: Dulera, Incruse Ellipta CVA: Plavix, PT/OT  FEN/GI: Heart healthy PPx: Lovenox Dispo:SNF tomorrow. Barriers include none, [facility] unable to accept until Monday.   Subjective:  No acute events overnight, states he is doing well this morning.  Objective: Temp:  [97.6 F (36.4 C)-98.5 F (36.9 C)] 98.3 F (36.8 C) (10/01 0303) Pulse Rate:  [62-71] 62 (10/01 0303) Resp:  [16-20] 19 (10/01 0303) BP: (103-137)/(63-84) 135/63 (10/01 0303) SpO2:  [98 %-100 %] 100 % (10/01 0303) Physical Exam: General: Resting comfortably and actively sitting up upon  arrival to room, appropriate conversation Cardiovascular: RRR, no murmurs auscultated Respiratory: CTA B, normal WOB  Laboratory: Most recent CBC Lab Results  Component Value Date   WBC 16.5 (H) 05/26/2022   HGB 11.6 (L) 05/26/2022   HCT 34.3 (L) 05/26/2022   MCV 84.5 05/26/2022   PLT 230 05/26/2022   Most recent BMP    Latest Ref Rng & Units 05/25/2022    4:49 AM  BMP  Glucose 70 - 99 mg/dL 92   BUN 8 - 23 mg/dL 15   Creatinine 0.61 - 1.24 mg/dL 1.01   Sodium 135 - 145 mmol/L 135   Potassium 3.5 - 5.1 mmol/L 3.6   Chloride 98 - 111 mmol/L 100   CO2 22 - 32 mmol/L 20   Calcium 8.9 - 10.3 mg/dL 9.1    Imaging/Diagnostic Tests: None  Wells Guiles, DO 05/28/2022, 6:47 AM PGY-2, [place] Intern pager: [phone], text pages welcome Secure chat group [facility]

## 2022-05-29 NOTE — TOC Transition Note (Signed)
Transition of Care [facility]) - CM/SW Discharge Note   Patient Details  Name: Seth [name] MRN: [id number] Date of Birth: 11/23/1955  Transition of Care [facility]) CM/SW Contact:  Coralee Pesa, [place] Phone Number: 05/29/2022, 12:00 PM   Clinical Narrative:    Pt to be transported to [facility] via [place]. RN to call report prior to discharge [phone]   Final next level of care: Skilled Nursing Facility Barriers to Discharge: Barriers Resolved   Patient Goals and CMS Choice Patient states their goals for this hospitalization and ongoing recovery are:: to get rehab      Discharge Placement              Patient chooses bed at:  [facility]) Patient to be transferred to facility by: [place] Name of family member notified: [name] Patient and family notified of of transfer: 05/29/22  Discharge Plan and Services   Discharge Planning Services: CM Consult                                 Social Determinants of Health (SDOH) Interventions Food Insecurity Interventions: Inpatient TOC Housing Interventions: Inpatient TOC Transportation Interventions: Intervention Not Indicated Utilities Interventions: Intervention Not Indicated   Readmission Risk Interventions     No data to display

## 2022-05-29 NOTE — Plan of Care (Signed)
  Problem: Education: Goal: Knowledge of General Education information will improve Description: Including pain rating scale, medication(s)/side effects and non-pharmacologic comfort measures Outcome: Progressing   Problem: Activity: Goal: Risk for activity intolerance will decrease Outcome: Progressing   Problem: Nutrition: Goal: Adequate nutrition will be maintained Outcome: Progressing   Problem: Coping: Goal: Level of anxiety will decrease Outcome: Progressing   

## 2022-05-29 NOTE — Discharge Summary (Addendum)
[facility] Discharge Summary  Patient name: Seth [name] Medical record number: [id number] Date of birth: 17-Jul-1956 Age: 66 y.o. Gender: male Date of Admission: 05/19/2022  Date of Discharge: 05/29/22  Admitting Physician: Erskine Emery, MD  Primary Care Provider: Lucianne Lei, MD Consultants: Neuro  Indication for Hospitalization: Seizures  Discharge Diagnoses/Problem List:  Principal Problem for Admission: Seizures Other Problems addressed during stay:  Principal Problem:   Seizures ([place]) Active Problems:   Multiple closed fractures of ribs of right side   HAP (hospital-acquired pneumonia)   Asymptomatic bacteriuria   Chronic alcoholism [facility])    Brief Hospital Course:  Seth [name] is a 66 y.o. male presenting with acute onset of brief event of breakthrough seizure.  Seizures Patient has history of seizures and took Levetiracetam '1500mg'$  bid, lacosamide '100mg'$  bid, and phenobarbital 32.'4mg'$  bid at home. He has not been taking his anti-seizure medications for 1 month prior to admission but reported to [place] different timeframes regarding his medication noncompliance.  It appears that he left [facility] and did not continue taking his antiseizure medications which caused him to have a breakthrough seizure. Neurology was on board and recommended increasing nightly phenobarbital to '64mg'$ .  We continued with this and monitored mental status. He did well while inpatient and had no further seizures.  HAP Patient starting spiking fevers and had a leukocytosis. CXR showed increased hazy airspace opacity which was concerning for HAP. Patient completed 5 day course of Cefepime.   Hx of CVA Patient has residual right sided weakness due to history of CVA. Patient started on home dose of Plavix.  No new CVA based on imaging.  Rib fractures Patient fell during seizure episode and had mildly displaced fractures of lateral right seventh and eighth ribs.  Imaging also showed old, documented subacute fractures, see imaging impression for further details. Patient is satting well on room air and during hospitalization, pain was controlled with oxycodone as needed.  Chronic Alcoholism  Patient reports drinking 2-3 drinks of vodka daily. On folic acid and thiamine supplement.  Patient was monitored via CIWA protocol and did not need Ativan during his stay.   Disposition: SNF  Discharge Condition: Stable  Issues for Follow Up:  Issues to follow up  Recommend Vascular consult as outpatient for ascending thoracic aorta  Repeat CT Head 1-2 years to follow up on meningioma CT chest/abd/pelvis notable for ascending thoracic aorta measuring 4 cm in diameter, recommended annual imaging follow up by CTA or MRA.  Repeat CMP and CBC at follow up, normocytic anemia of unknown cause, may benefit from iron studies outpatient   Discharge Exam:  Vitals:   05/29/22 0807 05/29/22 1044  BP:    Pulse:  79  Resp:    Temp:    SpO2: 100%    Physical Exam: General: Resting comfortably and actively sitting up upon arrival to room, appropriate conversation, alert and responsive, NAD Cardiovascular: RRR, no murmurs auscultated Respiratory: CTA B, normal WOB Abdomen: Soft nontender to palpation  Significant Procedures: None  Significant Labs and Imaging:  No results for input(s): "WBC", "HGB", "HCT", "PLT" in the last 48 hours. No results for input(s): "NA", "K", "CL", "CO2", "GLUCOSE", "BUN", "CREATININE", "CALCIUM", "MG", "PHOS", "ALKPHOS", "AST", "ALT", "ALBUMIN", "PROTEIN" in the last 48 hours.    Results/Tests Pending at Time of Discharge: None  Discharge Medications:  Allergies as of 05/29/2022   No Known Allergies      Medication List     STOP taking  these medications    polyethylene glycol 17 g packet Commonly known as: MIRALAX / GLYCOLAX   senna-docusate 8.6-50 MG tablet Commonly known as: Senokot-S       TAKE these medications     acetaminophen 325 MG tablet Commonly known as: TYLENOL Take 2 tablets (650 mg total) by mouth every 6 (six) hours as needed for headache or mild pain.   clopidogrel 75 MG tablet Commonly known as: PLAVIX Take 1 tablet (75 mg total) by mouth daily.   cyanocobalamin 1000 MCG tablet Take 1 tablet (1,000 mcg total) by mouth daily.   feeding supplement Liqd Take 237 mLs by mouth 3 (three) times daily between meals.   ferrous sulfate 325 (65 FE) MG tablet Take 1 tablet (325 mg total) by mouth 2 (two) times daily with a meal.   fluticasone-salmeterol 500-50 MCG/ACT Aepb Commonly known as: ADVAIR Inhale 1 puff into the lungs 2 (two) times daily.   folic acid 1 MG tablet Commonly known as: FOLVITE Take 1 tablet (1 mg total) by mouth daily.   gabapentin 100 MG capsule Commonly known as: NEURONTIN Take 100 mg by mouth 3 (three) times daily.   Incruse Ellipta 62.5 MCG/ACT Aepb Generic drug: umeclidinium bromide INHALE 1 PUFF INTO THE LUNGS DAILY   Lacosamide 100 MG Tabs Take 1 tablet (100 mg total) by mouth 2 (two) times daily.   levETIRAcetam 750 MG tablet Commonly known as: KEPPRA Take 2 tablets (1,500 mg total) by mouth 2 (two) times daily.   metoprolol tartrate 50 MG tablet Commonly known as: LOPRESSOR Take 1 tablet (50 mg total) by mouth 2 (two) times daily.   multivitamins ther. w/minerals Tabs tablet Take 1 tablet by mouth daily.   pantoprazole 40 MG tablet Commonly known as: PROTONIX Take 1 tablet (40 mg total) by mouth daily.   PHENobarbital 32.4 MG tablet Commonly known as: LUMINAL Take 1 tablet (32.4 mg total) by mouth in the morning. What changed: See the new instructions.   PHENobarbital 64.8 MG tablet Commonly known as: LUMINAL Take 1 tablet (64.8 mg total) by mouth at bedtime. What changed:  medication strength how much to take when to take this   rosuvastatin 10 MG tablet Commonly known as: CRESTOR Take 1 tablet (10 mg total) by mouth daily.    sertraline 25 MG tablet Commonly known as: ZOLOFT Take 25 mg by mouth daily.   thiamine 100 MG tablet Commonly known as: VITAMIN B1 Take 1 tablet (100 mg total) by mouth daily.   zinc sulfate 220 (50 Zn) MG capsule Take 1 capsule (220 mg total) by mouth daily.        Discharge Instructions: Please refer to Patient Instructions section of EMR for full details.  Patient was counseled important signs and symptoms that should prompt return to medical care, changes in medications, dietary instructions, activity restrictions, and follow up appointments.   Follow-Up Appointments:  Contact information for after-discharge care     Destination     [facility] Preferred SNF .   Service: Skilled Nursing Contact information: [place]                     Christene Slates, MD 05/29/2022, 11:53 AM PGY-1, [place]

## 2022-07-25 ENCOUNTER — Ambulatory Visit (INDEPENDENT_AMBULATORY_CARE_PROVIDER_SITE_OTHER): Payer: Medicare Other | Admitting: Physician Assistant

## 2022-07-25 ENCOUNTER — Ambulatory Visit (HOSPITAL_COMMUNITY)
Admission: RE | Admit: 2022-07-25 | Discharge: 2022-07-25 | Disposition: A | Discharge status: Home / Self Care | Payer: Medicare Other | Source: Ambulatory Visit | Attending: Vascular Surgery | Admitting: Vascular Surgery

## 2022-07-25 ENCOUNTER — Ambulatory Visit (INDEPENDENT_AMBULATORY_CARE_PROVIDER_SITE_OTHER)
Admission: RE | Admit: 2022-07-25 | Discharge: 2022-07-25 | Disposition: A | Discharge status: Home / Self Care | Payer: Medicare Other | Source: Ambulatory Visit | Attending: Vascular Surgery | Admitting: Vascular Surgery

## 2022-07-25 VITALS — BP 148/86 | HR 71 | Temp 97.6°F | Resp 18 | Ht 75.0 in | Wt 183.0 lb

## 2022-07-25 DIAGNOSIS — I739 Peripheral vascular disease, unspecified: Secondary | ICD-10-CM
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
## 2022-08-02 ENCOUNTER — Other Ambulatory Visit: Payer: Self-pay

## 2022-08-02 DIAGNOSIS — I739 Peripheral vascular disease, unspecified: Secondary | ICD-10-CM

## 2022-10-01 ENCOUNTER — Emergency Department (HOSPITAL_COMMUNITY): Payer: Medicare Other

## 2022-10-01 ENCOUNTER — Other Ambulatory Visit: Payer: Self-pay

## 2022-10-01 ENCOUNTER — Emergency Department (HOSPITAL_COMMUNITY)
Admission: EM | Admit: 2022-10-01 | Discharge: 2022-10-02 | Disposition: A | Discharge status: Home / Self Care | Payer: Medicare Other | Attending: Emergency Medicine | Admitting: Emergency Medicine

## 2022-10-01 DIAGNOSIS — Z79899 Other long term (current) drug therapy: Secondary | ICD-10-CM | POA: Diagnosis not present

## 2022-10-01 DIAGNOSIS — Z720 Tobacco use: Secondary | ICD-10-CM | POA: Diagnosis not present

## 2022-10-01 DIAGNOSIS — J449 Chronic obstructive pulmonary disease, unspecified: Secondary | ICD-10-CM | POA: Diagnosis not present

## 2022-10-01 DIAGNOSIS — F10129 Alcohol abuse with intoxication, unspecified: Secondary | ICD-10-CM | POA: Diagnosis present

## 2022-10-01 DIAGNOSIS — R531 Weakness: Secondary | ICD-10-CM | POA: Diagnosis not present

## 2022-10-01 DIAGNOSIS — Z7902 Long term (current) use of antithrombotics/antiplatelets: Secondary | ICD-10-CM | POA: Diagnosis not present

## 2022-10-01 DIAGNOSIS — Y904 Blood alcohol level of 80-99 mg/100 ml: Secondary | ICD-10-CM | POA: Insufficient documentation

## 2022-10-01 DIAGNOSIS — R7309 Other abnormal glucose: Secondary | ICD-10-CM | POA: Diagnosis not present

## 2022-10-01 DIAGNOSIS — F10929 Alcohol use, unspecified with intoxication, unspecified: Secondary | ICD-10-CM

## 2022-10-01 DIAGNOSIS — R7989 Other specified abnormal findings of blood chemistry: Secondary | ICD-10-CM | POA: Diagnosis not present

## 2022-10-01 DIAGNOSIS — E872 Acidosis, unspecified: Secondary | ICD-10-CM

## 2022-10-01 LAB — CBC WITH DIFFERENTIAL/PLATELET
Abs Immature Granulocytes: 0.04 10*3/uL (ref 0.00–0.07)
Basophils Absolute: 0 10*3/uL (ref 0.0–0.1)
Basophils Relative: 0 %
Eosinophils Absolute: 0.1 10*3/uL (ref 0.0–0.5)
Eosinophils Relative: 1 %
HCT: 45.7 % (ref 39.0–52.0)
Hemoglobin: 14.6 g/dL (ref 13.0–17.0)
Immature Granulocytes: 0 %
Lymphocytes Relative: 15 %
Lymphs Abs: 1.6 10*3/uL (ref 0.7–4.0)
MCH: 29.1 pg (ref 26.0–34.0)
MCHC: 31.9 g/dL (ref 30.0–36.0)
MCV: 91 fL (ref 80.0–100.0)
Monocytes Absolute: 0.7 10*3/uL (ref 0.1–1.0)
Monocytes Relative: 6 %
Neutro Abs: 8.3 10*3/uL — ABNORMAL HIGH (ref 1.7–7.7)
Neutrophils Relative %: 78 %
Platelets: 204 10*3/uL (ref 150–400)
RBC: 5.02 MIL/uL (ref 4.22–5.81)
RDW: 15.6 % — ABNORMAL HIGH (ref 11.5–15.5)
WBC: 10.8 10*3/uL — ABNORMAL HIGH (ref 4.0–10.5)
nRBC: 0 % (ref 0.0–0.2)

## 2022-10-01 LAB — COMPREHENSIVE METABOLIC PANEL
ALT: 67 U/L — ABNORMAL HIGH (ref 0–44)
AST: 118 U/L — ABNORMAL HIGH (ref 15–41)
Albumin: 3.9 g/dL (ref 3.5–5.0)
Alkaline Phosphatase: 172 U/L — ABNORMAL HIGH (ref 38–126)
Anion gap: 22 — ABNORMAL HIGH (ref 5–15)
BUN: 22 mg/dL (ref 8–23)
CO2: 15 mmol/L — ABNORMAL LOW (ref 22–32)
Calcium: 9.1 mg/dL (ref 8.9–10.3)
Chloride: 101 mmol/L (ref 98–111)
Creatinine, Ser: 1.18 mg/dL (ref 0.61–1.24)
GFR, Estimated: >60 mL/min (ref >60)
Glucose, Bld: 43 mg/dL — CL (ref 70–99)
Potassium: 3.9 mmol/L (ref 3.5–5.1)
Sodium: 138 mmol/L (ref 135–145)
Total Bilirubin: 0.5 mg/dL (ref 0.3–1.2)
Total Protein: 7.1 g/dL (ref 6.5–8.1)

## 2022-10-01 LAB — SALICYLATE LEVEL: Salicylate Lvl: <7 mg/dL — ABNORMAL LOW (ref 7.0–30.0)

## 2022-10-01 LAB — ACETAMINOPHEN LEVEL: Acetaminophen (Tylenol), Serum: <10 ug/mL — ABNORMAL LOW (ref 10–30)

## 2022-10-01 LAB — ETHANOL: Alcohol, Ethyl (B): 81 mg/dL — ABNORMAL HIGH (ref <10)

## 2022-10-01 LAB — CBG MONITORING, ED: Glucose-Capillary: 97 mg/dL (ref 70–99)

## 2022-10-01 MED ORDER — SODIUM CHLORIDE 0.9 % IV BOLUS
1000.0000 mL | Freq: Once | INTRAVENOUS | Status: AC
  Administered 2022-10-02: 1000 mL via INTRAVENOUS

## 2022-10-01 NOTE — ED Triage Notes (Signed)
Pt BIB [facility] from home. Pts family called because pt was drinking alcohol today and needs to be placed in a facility. Pt was at [facility] but discharged because insurance ran out. Pt is a/ox4, ambulatory w/cane.

## 2022-10-01 NOTE — ED Provider Notes (Signed)
67 year old male from home, requesting placement (no answer from family, voice mail full). Found to be hypoglycemic with metabolic acidosis. Provided with food, fluids, plan to watch CBG and BMP- admit if not improving. Denies ingesting other forms of alcohol.  Physical Exam  BP 128/67   Pulse (!) 102   Temp 98.3 F (36.8 C)   Resp 18   Ht '6\' 2"'$  (1.88 m)   Wt 82 kg   SpO2 95%   BMI 23.21 kg/m   Physical Exam  Procedures  Procedures  ED Course / MDM    Medical Decision Making Amount and/or Complexity of Data Reviewed Labs: ordered. Radiology: ordered.   ***

## 2022-10-01 NOTE — ED Provider Notes (Signed)
[facility] Provider Note   CSN: [id number] Arrival date & time: 10/01/22  2012     History  Chief Complaint  Patient presents with   Alcohol Intoxication    Seth [name] is a 67 y.o. male.  Patient presents emergency department via EMS with concerns over alcohol consumption and "needs for facility placement."  Patient was reportedly at a nursing facility, [facility], but was discharged due to insurance.  Upon arrival patient is ambulatory with cane, alert and oriented x 4.  Patient complains of some right-sided weakness due to previous stroke.  No acute complaints at this time. He endorses drinking 3 mixed drinks today. Calls to family went to a full mailbox. Past medical history significant for chronic alcoholism, malnutrition of moderate degree, COPD, tobacco use disorder, left foot first metatarsal amputation, seizures, stroke  HPI     Home Medications Prior to Admission medications   Medication Sig Start Date End Date Taking? Authorizing Provider  acetaminophen (TYLENOL) 325 MG tablet Take 2 tablets (650 mg total) by mouth every 6 (six) hours as needed for headache or mild pain. 10/18/21   Mercy Riding, MD  clopidogrel (PLAVIX) 75 MG tablet Take 1 tablet (75 mg total) by mouth daily. 05/24/21   Thurnell Lose, MD  feeding supplement (ENSURE ENLIVE / ENSURE PLUS) LIQD Take 237 mLs by mouth 3 (three) times daily between meals. Patient not taking: Reported on 07/25/2022 07/30/20   Jonetta Osgood, MD  ferrous sulfate 325 (65 FE) MG tablet Take 1 tablet (325 mg total) by mouth 2 (two) times daily with a meal. 10/18/21   Gonfa, Charlesetta Ivory, MD  fluticasone-salmeterol (ADVAIR) 500-50 MCG/ACT AEPB Inhale 1 puff into the lungs 2 (two) times daily. 04/13/21   [provider]  folic acid (FOLVITE) 1 MG tablet Take 1 tablet (1 mg total) by mouth daily. 03/21/21   Kathie Dike, MD  gabapentin (NEURONTIN) 100 MG capsule Take 100 mg by  mouth 3 (three) times daily. 07/07/21   [provider]  INCRUSE ELLIPTA 62.5 MCG/INH AEPB INHALE 1 PUFF INTO THE LUNGS DAILY 04/08/19   Mack Hook, MD  Lacosamide 100 MG TABS Take 1 tablet (100 mg total) by mouth 2 (two) times daily. 05/27/22 06/26/22  Erskine Emery, MD  levETIRAcetam (KEPPRA) 750 MG tablet Take 2 tablets (1,500 mg total) by mouth 2 (two) times daily. 03/20/21   Kathie Dike, MD  metoprolol tartrate (LOPRESSOR) 50 MG tablet Take 1 tablet (50 mg total) by mouth 2 (two) times daily. 03/20/21   Kathie Dike, MD  Multiple Vitamins-Minerals (MULTIVITAMINS THER. W/MINERALS) TABS tablet Take 1 tablet by mouth daily.    [provider]  pantoprazole (PROTONIX) 40 MG tablet Take 1 tablet (40 mg total) by mouth daily. 07/30/20   Ghimire, Henreitta Leber, MD  PHENobarbital (LUMINAL) 32.4 MG tablet Take 1 tablet (32.4 mg total) by mouth in the morning. 05/27/22   Erskine Emery, MD  PHENobarbital (LUMINAL) 64.8 MG tablet Take 1 tablet (64.8 mg total) by mouth at bedtime. 05/27/22   Erskine Emery, MD  rosuvastatin (CRESTOR) 10 MG tablet Take 1 tablet (10 mg total) by mouth daily. 09/28/20   Charlynne Cousins, MD  sertraline (ZOLOFT) 25 MG tablet Take 25 mg by mouth daily.    [provider]  thiamine 100 MG tablet Take 1 tablet (100 mg total) by mouth daily. 03/21/21   Kathie Dike, MD  vitamin B-12 1000 MCG tablet Take  1 tablet (1,000 mcg total) by mouth daily. 01/22/21   [place] Donnamarie Poag, DO  zinc sulfate 220 (50 Zn) MG capsule Take 1 capsule (220 mg total) by mouth daily. 10/19/21   Mercy Riding, MD      Allergies    Patient has no known allergies.    Review of Systems   Review of Systems  Neurological:  Positive for weakness (Chronic right side deficits).    Physical Exam Updated Vital Signs BP 128/67   Pulse (!) 102   Temp 98.3 F (36.8 C)   Resp 18   Ht '6\' 2"'$  (1.88 m)   Wt 82 kg   SpO2 95%   BMI 23.21 kg/m  Physical Exam Vitals and  nursing note reviewed.  Constitutional:      General: He is not in acute distress.    Appearance: He is well-developed.  HENT:     Head: Normocephalic and atraumatic.  Eyes:     Conjunctiva/sclera: Conjunctivae normal.  Cardiovascular:     Rate and Rhythm: Normal rate and regular rhythm.     Heart sounds: No murmur heard. Pulmonary:     Effort: Pulmonary effort is normal. No respiratory distress.     Breath sounds: Normal breath sounds.  Abdominal:     Palpations: Abdomen is soft.     Tenderness: There is no abdominal tenderness.  Musculoskeletal:        General: No swelling.     Cervical back: Neck supple.  Skin:    General: Skin is warm and dry.     Capillary Refill: Capillary refill takes less than 2 seconds.  Neurological:     Mental Status: He is alert.  Psychiatric:        Mood and Affect: Mood normal.     ED Results / Procedures / Treatments   Labs (all labs ordered are listed, but only abnormal results are displayed) Labs Reviewed  COMPREHENSIVE METABOLIC PANEL - Abnormal; Notable for the following components:      Result Value   CO2 15 (*)    Glucose, Bld 43 (*)    AST 118 (*)    ALT 67 (*)    Alkaline Phosphatase 172 (*)    Anion gap 22 (*)    All other components within normal limits  CBC WITH DIFFERENTIAL/PLATELET - Abnormal; Notable for the following components:   WBC 10.8 (*)    RDW 15.6 (*)    Neutro Abs 8.3 (*)    All other components within normal limits  SALICYLATE LEVEL - Abnormal; Notable for the following components:   Salicylate Lvl <3.9 (*)    All other components within normal limits  ACETAMINOPHEN LEVEL - Abnormal; Notable for the following components:   Acetaminophen (Tylenol), Serum <10 (*)    All other components within normal limits  ETHANOL - Abnormal; Notable for the following components:   Alcohol, Ethyl (B) 81 (*)    All other components within normal limits  URINALYSIS, ROUTINE W REFLEX MICROSCOPIC  RAPID URINE DRUG SCREEN,  HOSP PERFORMED  LACTIC ACID, PLASMA  LACTIC ACID, PLASMA  BETA-HYDROXYBUTYRIC ACID  CBG MONITORING, ED  I-STAT VENOUS BLOOD GAS, ED    EKG None  Radiology DG Chest 2 View  Result Date: 10/01/2022 CLINICAL DATA:  Weakness EXAM: CHEST - 2 VIEW COMPARISON:  05/22/2022 FINDINGS: Heart size and pulmonary vascularity are normal. Lungs are clear. Blunting of the right costophrenic angle is similar to prior study, likely pleural thickening. Right rib fractures are  unchanged since prior study. No airspace disease or consolidation in the lungs. No pleural effusions. No pneumothorax. Mediastinal contours appear intact. Postoperative changes in the cervical spine. IMPRESSION: Old right rib fractures with pleural thickening in the right costophrenic angle. No focal consolidation. Electronically Signed   By: Lucienne Capers M.D.   On: 10/01/2022 22:05    Procedures Procedures    Medications Ordered in ED Medications  sodium chloride 0.9 % bolus 1,000 mL (has no administration in time range)    ED Course/ Medical Decision Making/ A&P                             Medical Decision Making Amount and/or Complexity of Data Reviewed Labs: ordered. Radiology: ordered.   This patient presents to the ED for concern of alcohol consumption  Co morbidities that complicate the patient evaluation  Previous CVA, chronic alcohol abuse, seizures   Additional history obtained:  Additional history obtained from EMS External records from outside source obtained and reviewed including discharge summary from May 27, 2022.  Patient admitted due to a breakthrough seizure.   Lab Tests:  I Ordered, and personally interpreted labs.  The pertinent results include: Ethanol 81, glucose 43, mildly elevated LFTs, anion gap 22, bicarb 15   Imaging Studies ordered:  I ordered imaging studies including chest x-ray I independently visualized and interpreted imaging which showed  Old right rib fractures  with pleural thickening in the right  costophrenic angle. No focal consolidation.   I agree with the radiologist interpretation    Problem List / ED Course / Critical interventions / Medication management   I ordered medication including normal saline bolus for fluid resuscitation   Test / Admission - Considered:  Patient care being transferred to Suella Broad, PA-C at shift handoff.  Patient will be administered a saline bolus and have a metabolic panel rechecked.  Patient will also be monitored for blood glucose.  If glucose continues to drop or metabolic panel does not improve after fluid administration, patient will need admission to the hospital.  Otherwise patient will likely be discharged home.        Final Clinical Impression(s) / ED Diagnoses Final diagnoses:  None    Rx / DC Orders ED Discharge Orders     None         Ronny Bacon 10/01/22 2342    Regan Lemming, MD 10/03/22 [phone]

## 2022-10-01 NOTE — ED Notes (Signed)
Orange juice and snack provided to the pt

## 2022-10-02 LAB — BASIC METABOLIC PANEL
Anion gap: 16 — ABNORMAL HIGH (ref 5–15)
BUN: 23 mg/dL (ref 8–23)
CO2: 19 mmol/L — ABNORMAL LOW (ref 22–32)
Calcium: 8.8 mg/dL — ABNORMAL LOW (ref 8.9–10.3)
Chloride: 102 mmol/L (ref 98–111)
Creatinine, Ser: 1.08 mg/dL (ref 0.61–1.24)
GFR, Estimated: >60 mL/min (ref >60)
Glucose, Bld: 79 mg/dL (ref 70–99)
Potassium: 5.5 mmol/L — ABNORMAL HIGH (ref 3.5–5.1)
Sodium: 137 mmol/L (ref 135–145)

## 2022-10-02 LAB — I-STAT VENOUS BLOOD GAS, ED
Acid-base deficit: 6 mmol/L — ABNORMAL HIGH (ref 0.0–2.0)
Bicarbonate: 19.4 mmol/L — ABNORMAL LOW (ref 20.0–28.0)
Calcium, Ion: 1.11 mmol/L — ABNORMAL LOW (ref 1.15–1.40)
HCT: 48 % (ref 39.0–52.0)
Hemoglobin: 16.3 g/dL (ref 13.0–17.0)
O2 Saturation: 99 %
Potassium: 4.5 mmol/L (ref 3.5–5.1)
Sodium: 136 mmol/L (ref 135–145)
TCO2: 20 mmol/L — ABNORMAL LOW (ref 22–32)
pCO2, Ven: 36.1 mmHg — ABNORMAL LOW (ref 44–60)
pH, Ven: 7.337 (ref 7.25–7.43)
pO2, Ven: 146 mmHg — ABNORMAL HIGH (ref 32–45)

## 2022-10-02 LAB — LACTIC ACID, PLASMA
Lactic Acid, Venous: 2.6 mmol/L (ref 0.5–1.9)
Lactic Acid, Venous: 1.8 mmol/L (ref 0.5–1.9)

## 2022-10-02 LAB — BETA-HYDROXYBUTYRIC ACID: Beta-Hydroxybutyric Acid: 3.01 mmol/L — ABNORMAL HIGH (ref 0.05–0.27)

## 2022-10-02 LAB — PHOSPHORUS: Phosphorus: 3.8 mg/dL (ref 2.5–4.6)

## 2022-10-02 MED ORDER — LEVETIRACETAM 500 MG PO TABS: 1500.0000 mg | ORAL_TABLET | Freq: Two times a day (BID) | ORAL | Status: DC

## 2022-10-02 MED ORDER — LACOSAMIDE 50 MG PO TABS
100.0000 mg | ORAL_TABLET | Freq: Two times a day (BID) | ORAL | Status: DC
  Administered 2022-10-02: 100 mg via ORAL
  Filled 2022-10-02: qty 2

## 2022-10-02 MED ORDER — LEVETIRACETAM 500 MG PO TABS
1500.0000 mg | ORAL_TABLET | Freq: Once | ORAL | Status: AC
  Administered 2022-10-02: 1500 mg via ORAL
  Filled 2022-10-02: qty 3

## 2022-10-02 NOTE — Discharge Instructions (Addendum)
Home to rest and hydrate with alcohol free fluids. It is important to eat regular meals. Follow up with your doctor. Take medications as prescribed.

## 2022-10-11 ENCOUNTER — Emergency Department (HOSPITAL_COMMUNITY): Payer: Medicare Other

## 2022-10-11 ENCOUNTER — Emergency Department (HOSPITAL_COMMUNITY)
Admission: EM | Admit: 2022-10-11 | Discharge: 2022-10-11 | Disposition: A | Discharge status: Home / Self Care | Payer: Medicare Other | Attending: Emergency Medicine | Admitting: Emergency Medicine

## 2022-10-11 ENCOUNTER — Encounter (HOSPITAL_COMMUNITY): Payer: Self-pay

## 2022-10-11 ENCOUNTER — Other Ambulatory Visit: Payer: Self-pay

## 2022-10-11 DIAGNOSIS — R000 Tachycardia, unspecified: Secondary | ICD-10-CM | POA: Diagnosis not present

## 2022-10-11 DIAGNOSIS — R079 Chest pain, unspecified: Secondary | ICD-10-CM | POA: Diagnosis not present

## 2022-10-11 DIAGNOSIS — Z720 Tobacco use: Secondary | ICD-10-CM | POA: Diagnosis not present

## 2022-10-11 DIAGNOSIS — I10 Essential (primary) hypertension: Secondary | ICD-10-CM | POA: Diagnosis not present

## 2022-10-11 DIAGNOSIS — Z7902 Long term (current) use of antithrombotics/antiplatelets: Secondary | ICD-10-CM | POA: Insufficient documentation

## 2022-10-11 DIAGNOSIS — Z79899 Other long term (current) drug therapy: Secondary | ICD-10-CM | POA: Insufficient documentation

## 2022-10-11 LAB — BASIC METABOLIC PANEL
Anion gap: 14 (ref 5–15)
BUN: 8 mg/dL (ref 8–23)
CO2: 19 mmol/L — ABNORMAL LOW (ref 22–32)
Calcium: 8.9 mg/dL (ref 8.9–10.3)
Chloride: 106 mmol/L (ref 98–111)
Creatinine, Ser: 1 mg/dL (ref 0.61–1.24)
GFR, Estimated: >60 mL/min (ref >60)
Glucose, Bld: 73 mg/dL (ref 70–99)
Potassium: 3.8 mmol/L (ref 3.5–5.1)
Sodium: 139 mmol/L (ref 135–145)

## 2022-10-11 LAB — TROPONIN I (HIGH SENSITIVITY)
Troponin I (High Sensitivity): 6 ng/L (ref <18)
Troponin I (High Sensitivity): 6 ng/L (ref <18)

## 2022-10-11 LAB — CBC
HCT: 42.9 % (ref 39.0–52.0)
Hemoglobin: 13.7 g/dL (ref 13.0–17.0)
MCH: 28.6 pg (ref 26.0–34.0)
MCHC: 31.9 g/dL (ref 30.0–36.0)
MCV: 89.6 fL (ref 80.0–100.0)
Platelets: 192 10*3/uL (ref 150–400)
RBC: 4.79 MIL/uL (ref 4.22–5.81)
RDW: 14.7 % (ref 11.5–15.5)
WBC: 6.4 10*3/uL (ref 4.0–10.5)
nRBC: 0 % (ref 0.0–0.2)

## 2022-10-11 LAB — HEPATIC FUNCTION PANEL
ALT: 22 U/L (ref 0–44)
AST: 31 U/L (ref 15–41)
Albumin: 3.7 g/dL (ref 3.5–5.0)
Alkaline Phosphatase: 167 U/L — ABNORMAL HIGH (ref 38–126)
Bilirubin, Direct: 0.2 mg/dL (ref 0.0–0.2)
Indirect Bilirubin: 0.4 mg/dL (ref 0.3–0.9)
Total Bilirubin: 0.6 mg/dL (ref 0.3–1.2)
Total Protein: 7.2 g/dL (ref 6.5–8.1)

## 2022-10-11 NOTE — ED Provider Triage Note (Signed)
Emergency Medicine Provider Triage Evaluation Note  CORBETT SPITTLE , a 67 y.o. male  was evaluated in triage.  Pt complains of chest pain.  [place]resents [place] motel with concern that chest pain started at 7 AM this morning.  Chest pain improved with Tums prior to arrival.  Denies shortness of breath, abdominal pain, nausea, vomiting or other systemic symptoms.  Review of Systems  Positive: Chest pain Negative: Shortness of breath, nausea, vomiting, diaphoresis  Physical Exam  BP 132/86 (BP Location: Left Arm)   Pulse 98   Temp 98.7 F (37.1 C) (Oral)   Resp 18   Ht 6' 2"$  (1.88 m)   Wt 81.6 kg   SpO2 96%   BMI 23.11 kg/m  Gen:   Awake, no distress   Resp:  Normal effort  MSK:   Moves extremities without difficulty  Other:  No murmurs rubs or gallops, respirations unlabored  Medical Decision Making  Medically screening exam initiated at 11:49 AM.  Appropriate orders placed.  [name] was informed that the remainder of the evaluation will be completed by another provider, this initial triage assessment does not replace that evaluation, and the importance of remaining in the ED until their evaluation is complete.     Teressa Lower, MD 10/11/22 1151

## 2022-10-11 NOTE — Discharge Instructions (Signed)
Your laboratories also are within normal limits.  Please return to the emergency department if you experience new or worsening symptoms.

## 2022-10-11 NOTE — ED Provider Notes (Signed)
[facility] Provider Note   CSN: [id number] Arrival date & time: 10/11/22  1132     History  Chief Complaint  Patient presents with   Chest Pain    Seth [name] is a 67 y.o. male.  67 y.o male with a PMH Depression, HTN, Seizures presents to the ED via EMS with a chief complaint of chest pain x PTA. Patient reports eating hot wings today when suddenly he began to have some chest pain, describing it as a "acid reflux "sensation from his epigastric region onto his chest.  He did take some Tums after feeling somewhat nauseous which did resolve his chest pain.  He does report feeling somewhat short of breath when this episode occurred.  He does not have any history of CAD.  No exacerbating factors.  Patient does use alcohol daily, reports his last drink was last night.  He has not had any episodes of vomiting today, no abdominal pain, no fevers.  The history is provided by the patient and medical records.  Chest Pain Pain location:  Substernal area Associated symptoms: shortness of breath   Associated symptoms: no abdominal pain, no back pain, no fever, no nausea and no vomiting        Home Medications Prior to Admission medications   Medication Sig Start Date End Date Taking? Authorizing Provider  acetaminophen (TYLENOL) 325 MG tablet Take 2 tablets (650 mg total) by mouth every 6 (six) hours as needed for headache or mild pain. 10/18/21   Mercy Riding, MD  clopidogrel (PLAVIX) 75 MG tablet Take 1 tablet (75 mg total) by mouth daily. 05/24/21   Thurnell Lose, MD  feeding supplement (ENSURE ENLIVE / ENSURE PLUS) LIQD Take 237 mLs by mouth 3 (three) times daily between meals. 07/30/20   Ghimire, Henreitta Leber, MD  fluticasone-salmeterol (ADVAIR) 500-50 MCG/ACT AEPB Inhale 1 puff into the lungs 2 (two) times daily. 04/13/21   [provider]  gabapentin (NEURONTIN) 300 MG capsule Take 300 mg by mouth 3 (three) times daily. 07/07/21    [provider]  INCRUSE ELLIPTA 62.5 MCG/INH AEPB INHALE 1 PUFF INTO THE LUNGS DAILY Patient taking differently: Inhale 1 puff into the lungs daily. 04/08/19   Mack Hook, MD  Lacosamide 100 MG TABS Take 1 tablet (100 mg total) by mouth 2 (two) times daily. 05/27/22 10/03/23  Erskine Emery, MD  levETIRAcetam (KEPPRA) 750 MG tablet Take 2 tablets (1,500 mg total) by mouth 2 (two) times daily. 03/20/21   Kathie Dike, MD  metoprolol tartrate (LOPRESSOR) 50 MG tablet Take 1 tablet (50 mg total) by mouth 2 (two) times daily. 03/20/21   Kathie Dike, MD  pantoprazole (PROTONIX) 40 MG tablet Take 1 tablet (40 mg total) by mouth daily. Patient not taking: Reported on 10/02/2022 07/30/20   Jonetta Osgood, MD  PHENobarbital (LUMINAL) 32.4 MG tablet Take 1 tablet (32.4 mg total) by mouth in the morning. 05/27/22   Erskine Emery, MD  PHENobarbital (LUMINAL) 64.8 MG tablet Take 1 tablet (64.8 mg total) by mouth at bedtime. Patient not taking: Reported on 10/02/2022 05/27/22   Erskine Emery, MD  rosuvastatin (CRESTOR) 10 MG tablet Take 1 tablet (10 mg total) by mouth daily. 09/28/20   Charlynne Cousins, MD  sertraline (ZOLOFT) 25 MG tablet Take 25 mg by mouth daily.    [provider]      Allergies    Patient has no known allergies.    Review  of Systems   Review of Systems  Constitutional:  Negative for fever.  HENT:  Negative for sore throat.   Respiratory:  Positive for shortness of breath.   Cardiovascular:  Positive for chest pain.  Gastrointestinal:  Negative for abdominal pain, nausea and vomiting.  Genitourinary:  Negative for flank pain.  Musculoskeletal:  Negative for back pain.  All other systems reviewed and are negative.   Physical Exam Updated Vital Signs BP 139/88 (BP Location: Right Arm)   Pulse 93   Temp 98.1 F (36.7 C)   Resp 16   Ht 6' 2"$  (1.88 m)   Wt 81.6 kg   SpO2 100%   BMI 23.11 kg/m  Physical Exam Vitals and nursing note reviewed.   Constitutional:      Appearance: He is well-developed.  HENT:     Head: Normocephalic and atraumatic.  Eyes:     General: No scleral icterus.    Pupils: Pupils are equal, round, and reactive to light.  Cardiovascular:     Heart sounds: Normal heart sounds.  Pulmonary:     Effort: Pulmonary effort is normal.     Breath sounds: Normal breath sounds. No wheezing.  Chest:     Chest wall: No tenderness.  Abdominal:     General: Bowel sounds are normal. There is no distension.     Palpations: Abdomen is soft.     Tenderness: There is no abdominal tenderness.  Musculoskeletal:        General: No tenderness or deformity.     Cervical back: Normal range of motion.  Skin:    General: Skin is warm and dry.  Neurological:     Mental Status: He is alert and oriented to person, place, and time.     ED Results / Procedures / Treatments   Labs (all labs ordered are listed, but only abnormal results are displayed) Labs Reviewed  BASIC METABOLIC PANEL - Abnormal; Notable for the following components:      Result Value   CO2 19 (*)    All other components within normal limits  HEPATIC FUNCTION PANEL - Abnormal; Notable for the following components:   Alkaline Phosphatase 167 (*)    All other components within normal limits  CBC  TROPONIN I (HIGH SENSITIVITY)  TROPONIN I (HIGH SENSITIVITY)    EKG EKG Interpretation  Date/Time:  Wednesday October 11 2022 11:34:13 EST Ventricular Rate:  100 PR Interval:  170 QRS Duration: 88 QT Interval:  333 QTC Calculation: 430 R Axis:   61 Text Interpretation: Sinus tachycardia Confirmed by Lennice Sites ([id number]) on 10/11/2022 6:52:20 PM  Radiology DG Chest 2 View  Result Date: 10/11/2022 CLINICAL DATA:  Chest pain EXAM: CHEST - 2 VIEW COMPARISON:  10/01/2022 FINDINGS: Stable pleural thickening or small effusion at the right lung base. Hyperinflation. No consolidation or pneumothorax. No edema. Normal cardiopericardial silhouette. On the  frontal view of the left inferior costophrenic angle is clipped off the edge of the film. Old right rib fractures. IMPRESSION: No significant interval change.  No acute cardiopulmonary disease Electronically Signed   By: Jill Side M.D.   On: 10/11/2022 12:15    Procedures Procedures    Medications Ordered in ED Medications - No data to display  ED Course/ Medical Decision Making/ A&P                             Medical Decision Making Amount and/or Complexity of Data Reviewed  Labs: ordered. Radiology: ordered.    This patient presents to the ED for concern of epigastric,chest pain , this involves a number of treatment options, and is a complaint that carries with it a high risk of complications and morbidity.  The differential diagnosis includes ACS, reflux versus cholelithiasis.    Co morbidities: Discussed in HPI   Brief History:  Patient with underlying drug use such as cocaine use, alcohol use with his last drink being last night presents to the ED with a chief complaint of chest pain after eating some hot wings, reports feeling nauseous and short of breath when this occurred did not vomit.  He did take some Tums which resolved his symptoms.  Does not have any prior history of CAD, does endorse tobacco use and daily alcohol use.  EMR reviewed including pt PMHx, past surgical history and past visits to ER.   See HPI for more details   Lab Tests:  I ordered and independently interpreted labs.  The pertinent results include:    I personally reviewed all laboratory work and imaging. Metabolic panel without any acute abnormality specifically kidney function within normal limits and no significant electrolyte abnormalities. CBC without leukocytosis or significant anemia.   Imaging Studies:  NAD. I personally reviewed all imaging studies and no acute abnormality found. I agree with radiology interpretation.  Cardiac Monitoring:  The patient was maintained on a cardiac  monitor.  I personally viewed and interpreted the cardiac monitored which showed an underlying rhythm of: NSR EKG non-ischemic   Medicines ordered:  N/a   Reevaluation:  After the interventions noted above I re-evaluated patient and found that they have :resolved   Social Determinants of Health:  The patient's social determinants of health were a factor in the care of this patient  Problem List / ED Course:  Patient here with reflux pain, epigastric along with chest pain after eating some hot wings, reports he woke up at 7 AM and felt some substernal "like my acid reflux ", did take some Tums which resolved his symptoms.  Here patient is overall hemodynamically stable, no tachycardia or hypoxia.  His symptoms have now resolved.  He was evaluated by me after approximately 8 hours in the waiting room and in the emergency department.  His labs are within normal limits, troponin x 2 are normal.  EKG is sinus rhythm.  Chest x-ray without any signs of pneumonia or acute finding.  Some concern for gallbladder pathology.  Upon review of prior records I do see patients recent visit approximately 10 days ago and hide his LFTs were elevated, will repeat hepatic panel.  He is symptom-free at this time.  I do not suspect ACS as symptoms have now resolved after the Tums.  He does not have a prior cardiac history of an although he does have risk factors with tobacco use, he is overall very well-appearing. LFTs are within normal limits, he is pain-free at this time.  No tachycardia, no hypoxia to suggest pulmonary embolism.  Does have an ongoing history of PAD but continues to smoke, patient is currently living at a hotel.  No signs of alcohol withdrawal at this time.  Patient is hemodynamically stable for follow-up.  Dispostion:  After consideration of the diagnostic results and the patients response to treatment, I feel that the patent would benefit from outpatient follow up.    Portions of this note  were generated with [employer]. Dictation errors may occur despite best attempts at proofreading.  Final Clinical Impression(s) / ED Diagnoses Final diagnoses:  Chest pain, unspecified type    Rx / DC Orders ED Discharge Orders     None         Janeece Fitting, PA-C 10/11/22 2019    Lennice Sites, DO 10/11/22 2033

## 2022-10-11 NOTE — ED Triage Notes (Signed)
Pt [place] EMS [place] a motel. Pt reports he woke up this morning around 0700 experiencing chest pain and nausea. Pt states he took some tums and they have seemed to resolve the symptoms. Pt AxOx4. NAD.

## 2022-10-11 NOTE — ED Notes (Signed)
Main lab called to add hepatic function blood sample already in the lab, spoke with [facility], states will add.

## 2022-10-18 ENCOUNTER — Emergency Department (HOSPITAL_COMMUNITY): Payer: Medicare Other

## 2022-10-18 ENCOUNTER — Other Ambulatory Visit: Payer: Self-pay

## 2022-10-18 ENCOUNTER — Inpatient Hospital Stay (HOSPITAL_COMMUNITY)
Admission: EM | Admit: 2022-10-18 | Discharge: 2022-10-20 | DRG: 101 | Disposition: A | Discharge status: Home / Self Care | Payer: Medicare Other | Attending: Internal Medicine | Admitting: Internal Medicine

## 2022-10-18 DIAGNOSIS — R400 Somnolence: Secondary | ICD-10-CM | POA: Diagnosis present

## 2022-10-18 DIAGNOSIS — Z7902 Long term (current) use of antithrombotics/antiplatelets: Secondary | ICD-10-CM | POA: Diagnosis not present

## 2022-10-18 DIAGNOSIS — E785 Hyperlipidemia, unspecified: Secondary | ICD-10-CM | POA: Diagnosis present

## 2022-10-18 DIAGNOSIS — D696 Thrombocytopenia, unspecified: Secondary | ICD-10-CM | POA: Diagnosis present

## 2022-10-18 DIAGNOSIS — Z789 Other specified health status: Secondary | ICD-10-CM | POA: Diagnosis not present

## 2022-10-18 DIAGNOSIS — G8191 Hemiplegia, unspecified affecting right dominant side: Secondary | ICD-10-CM | POA: Diagnosis present

## 2022-10-18 DIAGNOSIS — F17210 Nicotine dependence, cigarettes, uncomplicated: Secondary | ICD-10-CM | POA: Diagnosis present

## 2022-10-18 DIAGNOSIS — G40909 Epilepsy, unspecified, not intractable, without status epilepticus: Principal | ICD-10-CM

## 2022-10-18 DIAGNOSIS — J449 Chronic obstructive pulmonary disease, unspecified: Secondary | ICD-10-CM | POA: Diagnosis present

## 2022-10-18 DIAGNOSIS — R29818 Other symptoms and signs involving the nervous system: Secondary | ICD-10-CM | POA: Diagnosis not present

## 2022-10-18 DIAGNOSIS — F17200 Nicotine dependence, unspecified, uncomplicated: Secondary | ICD-10-CM | POA: Diagnosis not present

## 2022-10-18 DIAGNOSIS — F32A Depression, unspecified: Secondary | ICD-10-CM | POA: Diagnosis present

## 2022-10-18 DIAGNOSIS — I10 Essential (primary) hypertension: Secondary | ICD-10-CM | POA: Diagnosis present

## 2022-10-18 DIAGNOSIS — G8384 Todd's paralysis (postepileptic): Secondary | ICD-10-CM | POA: Diagnosis present

## 2022-10-18 DIAGNOSIS — Z91148 Patient's other noncompliance with medication regimen for other reason: Secondary | ICD-10-CM

## 2022-10-18 DIAGNOSIS — Z8673 Personal history of transient ischemic attack (TIA), and cerebral infarction without residual deficits: Secondary | ICD-10-CM | POA: Diagnosis not present

## 2022-10-18 DIAGNOSIS — E440 Moderate protein-calorie malnutrition: Secondary | ICD-10-CM | POA: Diagnosis present

## 2022-10-18 DIAGNOSIS — R29810 Facial weakness: Secondary | ICD-10-CM | POA: Diagnosis present

## 2022-10-18 DIAGNOSIS — H53461 Homonymous bilateral field defects, right side: Secondary | ICD-10-CM | POA: Diagnosis present

## 2022-10-18 DIAGNOSIS — Z7951 Long term (current) use of inhaled steroids: Secondary | ICD-10-CM

## 2022-10-18 DIAGNOSIS — R569 Unspecified convulsions: Secondary | ICD-10-CM | POA: Diagnosis present

## 2022-10-18 DIAGNOSIS — N400 Enlarged prostate without lower urinary tract symptoms: Secondary | ICD-10-CM | POA: Diagnosis present

## 2022-10-18 DIAGNOSIS — M109 Gout, unspecified: Secondary | ICD-10-CM | POA: Diagnosis present

## 2022-10-18 DIAGNOSIS — F1090 Alcohol use, unspecified, uncomplicated: Secondary | ICD-10-CM | POA: Diagnosis present

## 2022-10-18 DIAGNOSIS — Z8249 Family history of ischemic heart disease and other diseases of the circulatory system: Secondary | ICD-10-CM

## 2022-10-18 DIAGNOSIS — F1020 Alcohol dependence, uncomplicated: Secondary | ICD-10-CM | POA: Diagnosis present

## 2022-10-18 DIAGNOSIS — Z79899 Other long term (current) drug therapy: Secondary | ICD-10-CM | POA: Diagnosis not present

## 2022-10-18 DIAGNOSIS — I639 Cerebral infarction, unspecified: Secondary | ICD-10-CM

## 2022-10-18 LAB — DIFFERENTIAL
Abs Immature Granulocytes: 0.03 10*3/uL (ref 0.00–0.07)
Basophils Absolute: 0 10*3/uL (ref 0.0–0.1)
Basophils Relative: 0 %
Eosinophils Absolute: 0.1 10*3/uL (ref 0.0–0.5)
Eosinophils Relative: 1 %
Immature Granulocytes: 0 %
Lymphocytes Relative: 9 %
Lymphs Abs: 0.8 10*3/uL (ref 0.7–4.0)
Monocytes Absolute: 0.7 10*3/uL (ref 0.1–1.0)
Monocytes Relative: 8 %
Neutro Abs: 7.3 10*3/uL (ref 1.7–7.7)
Neutrophils Relative %: 82 %

## 2022-10-18 LAB — CBC
HCT: 42.7 % (ref 39.0–52.0)
Hemoglobin: 13.7 g/dL (ref 13.0–17.0)
MCH: 28.8 pg (ref 26.0–34.0)
MCHC: 32.1 g/dL (ref 30.0–36.0)
MCV: 89.9 fL (ref 80.0–100.0)
Platelets: 163 10*3/uL (ref 150–400)
RBC: 4.75 MIL/uL (ref 4.22–5.81)
RDW: 14.9 % (ref 11.5–15.5)
WBC: 8.9 10*3/uL (ref 4.0–10.5)
nRBC: 0 % (ref 0.0–0.2)

## 2022-10-18 LAB — COMPREHENSIVE METABOLIC PANEL
ALT: 18 U/L (ref 0–44)
AST: 40 U/L (ref 15–41)
Albumin: 3.8 g/dL (ref 3.5–5.0)
Alkaline Phosphatase: 200 U/L — ABNORMAL HIGH (ref 38–126)
Anion gap: 13 (ref 5–15)
BUN: 8 mg/dL (ref 8–23)
CO2: 24 mmol/L (ref 22–32)
Calcium: 9.3 mg/dL (ref 8.9–10.3)
Chloride: 101 mmol/L (ref 98–111)
Creatinine, Ser: 1.13 mg/dL (ref 0.61–1.24)
GFR, Estimated: >60 mL/min (ref >60)
Glucose, Bld: 93 mg/dL (ref 70–99)
Potassium: 3.9 mmol/L (ref 3.5–5.1)
Sodium: 138 mmol/L (ref 135–145)
Total Bilirubin: 0.9 mg/dL (ref 0.3–1.2)
Total Protein: 7.1 g/dL (ref 6.5–8.1)

## 2022-10-18 LAB — APTT: aPTT: 29 seconds (ref 24–36)

## 2022-10-18 LAB — PROTIME-INR
INR: 1 (ref 0.8–1.2)
Prothrombin Time: 12.9 seconds (ref 11.4–15.2)

## 2022-10-18 LAB — I-STAT CHEM 8, ED
BUN: 8 mg/dL (ref 8–23)
Calcium, Ion: 1.15 mmol/L (ref 1.15–1.40)
Chloride: 102 mmol/L (ref 98–111)
Creatinine, Ser: 1 mg/dL (ref 0.61–1.24)
Glucose, Bld: 92 mg/dL (ref 70–99)
HCT: 48 % (ref 39.0–52.0)
Hemoglobin: 16.3 g/dL (ref 13.0–17.0)
Potassium: 4 mmol/L (ref 3.5–5.1)
Sodium: 138 mmol/L (ref 135–145)
TCO2: 25 mmol/L (ref 22–32)

## 2022-10-18 LAB — PHENOBARBITAL LEVEL: Phenobarbital: 13.2 ug/mL — ABNORMAL LOW (ref 15.0–40.0)

## 2022-10-18 LAB — ETHANOL: Alcohol, Ethyl (B): <10 mg/dL (ref <10)

## 2022-10-18 MED ORDER — LORAZEPAM 2 MG/ML IJ SOLN: 1.0000 mg | INTRAMUSCULAR | Status: DC | PRN

## 2022-10-18 MED ORDER — ADULT MULTIVITAMIN W/MINERALS CH
1.0000 | ORAL_TABLET | Freq: Every day | ORAL | Status: DC
  Administered 2022-10-19 – 2022-10-20 (×2): 1 via ORAL
  Filled 2022-10-18 (×2): qty 1

## 2022-10-18 MED ORDER — THIAMINE MONONITRATE 100 MG PO TABS
100.0000 mg | ORAL_TABLET | Freq: Every day | ORAL | Status: DC
  Administered 2022-10-19 – 2022-10-20 (×2): 100 mg via ORAL
  Filled 2022-10-18 (×2): qty 1

## 2022-10-18 MED ORDER — LORAZEPAM 1 MG PO TABS: 1.0000 mg | ORAL_TABLET | ORAL | Status: DC | PRN

## 2022-10-18 MED ORDER — LACOSAMIDE 50 MG PO TABS
100.0000 mg | ORAL_TABLET | Freq: Two times a day (BID) | ORAL | Status: DC
  Administered 2022-10-18 – 2022-10-20 (×4): 100 mg via ORAL
  Filled 2022-10-18 (×4): qty 2

## 2022-10-18 MED ORDER — LORAZEPAM 2 MG/ML IJ SOLN: 0.0000 mg | Freq: Two times a day (BID) | INTRAMUSCULAR | Status: DC

## 2022-10-18 MED ORDER — NICOTINE 14 MG/24HR TD PT24
14.0000 mg | MEDICATED_PATCH | Freq: Every day | TRANSDERMAL | Status: DC
  Administered 2022-10-19 – 2022-10-20 (×2): 14 mg via TRANSDERMAL
  Filled 2022-10-18 (×2): qty 1

## 2022-10-18 MED ORDER — THIAMINE HCL 100 MG/ML IJ SOLN: 100.0000 mg | Freq: Every day | INTRAMUSCULAR | Status: DC

## 2022-10-18 MED ORDER — LEVETIRACETAM 750 MG PO TABS
1500.0000 mg | ORAL_TABLET | Freq: Two times a day (BID) | ORAL | Status: DC
  Administered 2022-10-19 – 2022-10-20 (×3): 1500 mg via ORAL
  Filled 2022-10-18 (×3): qty 2

## 2022-10-18 MED ORDER — LORAZEPAM 2 MG/ML IJ SOLN
0.0000 mg | Freq: Four times a day (QID) | INTRAMUSCULAR | Status: DC
  Administered 2022-10-19: 1 mg via INTRAVENOUS
  Filled 2022-10-18: qty 1

## 2022-10-18 MED ORDER — LEVETIRACETAM IN NACL 1000 MG/100ML IV SOLN
2000.0000 mg | Freq: Once | INTRAVENOUS | Status: AC
  Administered 2022-10-18: 2000 mg via INTRAVENOUS
  Filled 2022-10-18: qty 200

## 2022-10-18 MED ORDER — PHENOBARBITAL 32.4 MG PO TABS
32.4000 mg | ORAL_TABLET | Freq: Every morning | ORAL | Status: DC
  Administered 2022-10-19 – 2022-10-20 (×2): 32.4 mg via ORAL
  Filled 2022-10-18 (×2): qty 1

## 2022-10-18 MED ORDER — FOLIC ACID 1 MG PO TABS
1.0000 mg | ORAL_TABLET | Freq: Every day | ORAL | Status: DC
  Administered 2022-10-19 – 2022-10-20 (×2): 1 mg via ORAL
  Filled 2022-10-18 (×2): qty 1

## 2022-10-18 MED ORDER — IOHEXOL 350 MG/ML SOLN
100.0000 mL | Freq: Once | INTRAVENOUS | Status: AC | PRN
  Administered 2022-10-18: 100 mL via INTRAVENOUS

## 2022-10-18 MED ORDER — PHENOBARBITAL 32.4 MG PO TABS
64.8000 mg | ORAL_TABLET | Freq: Every day | ORAL | Status: DC
  Administered 2022-10-19 (×2): 64.8 mg via ORAL
  Filled 2022-10-18: qty 1
  Filled 2022-10-18: qty 2

## 2022-10-18 NOTE — ED Notes (Addendum)
Pt resting.
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
## 2022-10-18 NOTE — ED Notes (Signed)
Patient transported to MRI 

## 2022-10-18 NOTE — ED Notes (Signed)
[facility]EL[facility] CALLED TO ACTIVATE STOKE AT 1924

## 2022-10-18 NOTE — ED Triage Notes (Signed)
Pt coming from home, called out for seizures. Found at a motel. He was found by family having seizures. Seizures are about 2-5 mins each  150 HR 7.5 versed  176/80 Hx of drinking and unsure about drug use.

## 2022-10-18 NOTE — Progress Notes (Signed)
EEG complete - results pending 

## 2022-10-18 NOTE — Code Documentation (Addendum)
Responded to Code Stroke called on pt already in the ED for seizures. Code Stroke called at 1923 for L gaze preference, R hemianopia, R sided facial droop, R sided weakness, R sensory deficit, and R sided neglect, NIH-9, CT head negative for acute changes, CTA/CTP-no LVO/core infarct. Plan EEG/MRI.
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
## 2022-10-18 NOTE — ED Provider Notes (Signed)
[facility] Provider Note   CSN: [id number] Arrival date & time: 10/18/22  1902     History  Chief Complaint  Patient presents with   Seizures    Seth [name] is a 67 y.o. male.  The history is provided by the EMS personnel and the patient. The history is limited by the condition of the patient.  Seizures Seizure activity on arrival: yes   Seizure type:  Focal and grand mal Preceding symptoms: no headache   Episode characteristics: focal shaking, generalized shaking and partial responsiveness   Postictal symptoms: confusion and somnolence   Severity:  Severe Timing:  Intermittent Number of seizures this episode:  Patient has had a total of 4 seizures witnessed by EMS most recent upon arrival to the room. Progression:  Unchanged Context comment:  Possibly has not had alcohol in several days Recent head injury:  No recent head injuries PTA treatment:  Midazolam History of seizures: yes   Neurologic Problem This is a new problem. The current episode started yesterday. The problem occurs constantly. The problem has not changed since onset.Pertinent negatives include no chest pain, no abdominal pain, no headaches and no shortness of breath. Nothing aggravates the symptoms. Nothing relieves the symptoms. He has tried nothing for the symptoms. The treatment provided no relief.       Home Medications Prior to Admission medications   Medication Sig Start Date End Date Taking? Authorizing Provider  acetaminophen (TYLENOL) 325 MG tablet Take 2 tablets (650 mg total) by mouth every 6 (six) hours as needed for headache or mild pain. 10/18/21   Mercy Riding, MD  clopidogrel (PLAVIX) 75 MG tablet Take 1 tablet (75 mg total) by mouth daily. 05/24/21   Thurnell Lose, MD  feeding supplement (ENSURE ENLIVE / ENSURE PLUS) LIQD Take 237 mLs by mouth 3 (three) times daily between meals. 07/30/20   Ghimire, Henreitta Leber, MD  fluticasone-salmeterol  (ADVAIR) 500-50 MCG/ACT AEPB Inhale 1 puff into the lungs 2 (two) times daily. 04/13/21   [provider]  gabapentin (NEURONTIN) 300 MG capsule Take 300 mg by mouth 3 (three) times daily. 07/07/21   [provider]  INCRUSE ELLIPTA 62.5 MCG/INH AEPB INHALE 1 PUFF INTO THE LUNGS DAILY Patient taking differently: Inhale 1 puff into the lungs daily. 04/08/19   Mack Hook, MD  Lacosamide 100 MG TABS Take 1 tablet (100 mg total) by mouth 2 (two) times daily. 05/27/22 10/03/23  Erskine Emery, MD  levETIRAcetam (KEPPRA) 750 MG tablet Take 2 tablets (1,500 mg total) by mouth 2 (two) times daily. 03/20/21   Kathie Dike, MD  metoprolol tartrate (LOPRESSOR) 50 MG tablet Take 1 tablet (50 mg total) by mouth 2 (two) times daily. 03/20/21   Kathie Dike, MD  pantoprazole (PROTONIX) 40 MG tablet Take 1 tablet (40 mg total) by mouth daily. Patient not taking: Reported on 10/02/2022 07/30/20   Jonetta Osgood, MD  PHENobarbital (LUMINAL) 32.4 MG tablet Take 1 tablet (32.4 mg total) by mouth in the morning. 05/27/22   Erskine Emery, MD  PHENobarbital (LUMINAL) 64.8 MG tablet Take 1 tablet (64.8 mg total) by mouth at bedtime. Patient not taking: Reported on 10/02/2022 05/27/22   Erskine Emery, MD  rosuvastatin (CRESTOR) 10 MG tablet Take 1 tablet (10 mg total) by mouth daily. 09/28/20   Charlynne Cousins, MD  sertraline (ZOLOFT) 25 MG tablet Take 25 mg by mouth daily.    [provider]  Allergies    Patient has no known allergies.    Review of Systems   Review of Systems  Unable to perform ROS: Mental status change  Constitutional:  Negative for chills and fever.  Respiratory:  Negative for shortness of breath.   Cardiovascular:  Negative for chest pain.  Gastrointestinal:  Negative for abdominal pain, nausea and vomiting.  Musculoskeletal:  Negative for back pain.  Neurological:  Positive for seizures, facial asymmetry, speech difficulty and weakness. Negative for  headaches.  Psychiatric/Behavioral:  Negative for agitation.   All other systems reviewed and are negative.   Physical Exam Updated Vital Signs BP (!) 158/92   Pulse 100   Temp 98.2 F (36.8 C) (Oral)   Resp 18   Ht 6' 2"$  (1.88 m)   Wt 81.6 kg   SpO2 99%   BMI 23.11 kg/m  Physical Exam Vitals and nursing note reviewed.  Constitutional:      General: He is not in acute distress.    Appearance: He is well-developed. He is ill-appearing. He is not toxic-appearing or diaphoretic.  HENT:     Head: Atraumatic.     Nose: Nose normal.     Mouth/Throat:     Mouth: Mucous membranes are moist.  Eyes:     Conjunctiva/sclera: Conjunctivae normal.  Neck:     Vascular: No carotid bruit.  Cardiovascular:     Rate and Rhythm: Normal rate and regular rhythm.     Heart sounds: No murmur heard. Pulmonary:     Effort: Pulmonary effort is normal. No respiratory distress.     Breath sounds: Normal breath sounds. No wheezing or rhonchi.  Chest:     Chest wall: No tenderness.  Abdominal:     General: Abdomen is flat.     Palpations: Abdomen is soft.     Tenderness: There is no abdominal tenderness. There is no guarding or rebound.  Musculoskeletal:        General: No swelling or tenderness.     Cervical back: Neck supple. No tenderness.     Right lower leg: No edema.     Left lower leg: No edema.  Skin:    General: Skin is warm and dry.     Capillary Refill: Capillary refill takes less than 2 seconds.     Findings: No erythema.  Neurological:     Mental Status: He is confused.     Cranial Nerves: Cranial nerve deficit and facial asymmetry present.     Sensory: No sensory deficit.     Motor: Weakness and abnormal muscle tone present.     Comments: Right face, right arm, and right leg weakness.  Patient has slow speech and not answering questions appropriately.  Pupils symmetric and reactive.  Possible neglect.  Psychiatric:        Mood and Affect: Mood normal.     ED Results /  Procedures / Treatments   Labs (all labs ordered are listed, but only abnormal results are displayed) Labs Reviewed  COMPREHENSIVE METABOLIC PANEL - Abnormal; Notable for the following components:      Result Value   Alkaline Phosphatase 200 (*)    All other components within normal limits  PHENOBARBITAL LEVEL - Abnormal; Notable for the following components:   Phenobarbital 13.2 (*)    All other components within normal limits  ETHANOL  CBC  DIFFERENTIAL  PROTIME-INR  APTT  RAPID URINE DRUG SCREEN, HOSP PERFORMED  URINALYSIS, ROUTINE W REFLEX MICROSCOPIC  LEVETIRACETAM LEVEL  LAMOTRIGINE LEVEL  I-STAT CHEM 8, ED    EKG EKG Interpretation  Date/Time:  Wednesday October 18 2022 19:05:47 EST Ventricular Rate:  131 PR Interval:  135 QRS Duration: 78 QT Interval:  290 QTC Calculation: 429 R Axis:   58 Text Interpretation: Sinus tachycardia Ventricular premature complex Consider right atrial enlargement when compared to prior, faster rate. No STEMI Confirmed by Antony Blackbird [phone]) on 10/18/2022 7:30:18 PM  Radiology EEG adult  Result Date: 10/18/2022 Lora Havens, MD     10/18/2022 10:02 PM Patient Name: Seth [name] MRN: [id number] Epilepsy Attending: Lora Havens Referring Physician/Provider: Kerney Elbe, MD Date: 10/18/2022 Duration: Patient history: Acute onset of right hemiplegia and right hemineglect following 4 breakthrough seizures Level of alertness: Awake AEDs during EEG study: None Technical aspects: This EEG study was done with scalp electrodes positioned according to the 10-20 International system of electrode placement. Electrical activity was reviewed with band pass filter of 1-70Hz$ , sensitivity of 7 uV/mm, display speed of 63m/sec with a 60Hz$  notched filter applied as appropriate. EEG data were recorded continuously and digitally stored.  Video monitoring was available and reviewed as appropriate. Description: The posterior dominant rhythm consists of  8-9 Hz activity of moderate voltage (25-35 uV) seen predominantly in posterior head regions, symmetric and reactive to eye opening and eye closing. EEG showed intermittent generalized 3 to 6 Hz theta-delta slowing. Physiologic photic driving was not seen during photic stimulation.  Hyperventilation was not performed.   Of note, study was technically difficult due to significant myogenic artifact. ABNORMALITY - Intermittent slow, generalized IMPRESSION: This technically difficult study is suggestive of mild diffuse encephalopathy, nonspecific etiology. No seizures or epileptiform discharges were seen throughout the recording. Please note that lack of epileptiform activity during l interictal EEG does not exclude the diagnosis of epilepsy. Priyanka [name]  CT ANGIO HEAD NECK W WO CM W PERF (CODE STROKE)  Result Date: 10/18/2022 CLINICAL DATA:  Neuro deficit, acute, stroke suspected.  Seizures. EXAM: CT ANGIOGRAPHY HEAD AND NECK CT PERFUSION BRAIN TECHNIQUE: Multidetector CT imaging of the head and neck was performed using the standard protocol during bolus administration of intravenous contrast. Multiplanar CT image reconstructions and MIPs were obtained to evaluate the vascular anatomy. Carotid stenosis measurements (when applicable) are obtained utilizing NASCET criteria, using the distal internal carotid diameter as the denominator. Multiphase CT imaging of the brain was performed following IV bolus contrast injection. Subsequent parametric perfusion maps were calculated using RAPID software. RADIATION DOSE REDUCTION: This exam was performed according to the departmental dose-optimization program which includes automated exposure control, adjustment of the mA and/or kV according to patient size and/or use of iterative reconstruction technique. CONTRAST:  1062mOMNIPAQUE IOHEXOL 350 MG/ML SOLN COMPARISON:  Head CT immediately prior. Previous CT angiography 07/19/2021. FINDINGS: CTA NECK FINDINGS Aortic arch:  Aortic atherosclerosis.  Branching pattern is normal. Right carotid system: Common carotid artery widely patent to the bifurcation. Calcified plaque at the carotid bifurcation and ICA bulb. Minimal diameter in the bulb measures 4.5 mm. 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 No flow limiting stenosis in this location. Left carotid system: Common carotid artery widely patent to the bifurcation. Calcified plaque at the carotid bifurcation and ICA bulb. No stenosis. 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 Mild narrowing of the true lumen of the vessel adjacent to that, with stenosis of 30%. Irregularity of the lumen could serve as a  source of embolic disease. The appearance is not visibly changed since November of 2022. Vertebral arteries: The vertebral arteries are small vessels. There is plaque at the right vertebral artery origin with stenosis of 30%. No left vertebral artery origin stenosis. Both vertebral arteries are patent through the cervical region to the foramen magnum. Skeleton: Previous ACDF C3-4. Other neck: No mass or lymphadenopathy. Upper chest: Mild emphysema at the lung apices. Review of the MIP images confirms the above findings CTA HEAD FINDINGS Anterior circulation: These both internal carotid arteries are patent through the skull base and siphon regions. Extensive siphon atherosclerotic calcification with stenosis estimated at 30% on both sides. The anterior and middle cerebral vessels are patent. No large vessel occlusion or correctable proximal stenosis. Fetal origin of both posterior cerebral arteries. Posterior circulation: Both vertebral arteries are patent through the foramen magnum to the basilar artery. Basilar artery is small because of fetal origin of both posterior cerebral arteries. Posteroinferior cerebellar arteries and superior cerebellar arteries show flow.  Venous sinuses: Patent and normal. Anatomic variants: None other significant. Review of the MIP images confirms the above findings CT Brain Perfusion Findings: ASPECTS: 10 CBF (<30%) Volume: 15m Perfusion (Tmax>6.0s) volume: 157mMismatch Volume: 105mnfarction Location:No actual infarction. T-max greater than 6 in the left parietal region. IMPRESSION: 1. No acute large vessel occlusion or correctable proximal stenosis. 2. 10 mL region of ischemic penumbra in the left parietal region. No actual core infarction. 3. 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 This could serve as a source of embolic disease. 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 The appearance is not visibly changed since November of 2022. 4. 30% stenosis of the right vertebral artery origin. 5. Atherosclerotic change at both carotid bifurcations but without stenosis. 6. Atherosclerotic change in both carotid siphon regions with stenosis estimated at 30% on both sides. 7. Fetal origin of both posterior cerebral arteries. 8. Aortic atherosclerosis. Aortic Atherosclerosis (ICD10-I70.0). Electronically Signed   By: MarNelson ChimesD.   On: 10/18/2022 20:20   CT HEAD CODE STROKE WO CONTRAST  Result Date: 10/18/2022 CLINICAL DATA:  Code stroke.  Right-sided weakness EXAM: CT HEAD WITHOUT CONTRAST TECHNIQUE: Contiguous axial images were obtained from the base of the skull through the vertex without intravenous contrast. RADIATION DOSE REDUCTION: This exam was performed according to the departmental dose-optimization program which includes automated exposure control, adjustment of the mA and/or kV according to patient size and/or use of iterative reconstruction technique. COMPARISON:  05/19/2022 FINDINGS: Brain: There is no mass, hemorrhage or extra-axial collection. There is generalized atrophy without lobar predilection. There is hypoattenuation of  the periventricular white matter, most commonly indicating chronic ischemic microangiopathy. Old bilateral cerebellar infarcts. Vascular: Atherosclerotic calcification of the internal carotid arteries at the skull base. No abnormal hyperdensity of the major intracranial arteries or dural venous sinuses. Skull: The visualized skull base, calvarium and extracranial soft tissues are normal. Sinuses/Orbits: No fluid levels or advanced mucosal thickening of the visualized paranasal sinuses. No mastoid or middle ear effusion. The orbits are normal. ASPECTS ([facility] Early [facility] Score) - Ganglionic level infarction (caudate, lentiform nuclei, internal capsule, insula, M1-M3 cortex): 7 - Supraganglionic infarction (M4-M6 cortex): 3 Total score (0-10 with 10 being normal): 10 IMPRESSION: 1. No acute intracranial abnormality. 2. ASPECTS is 10. 3. Old bilateral cerebellar infarcts. These results were communicated to Dr. EriKerney Elbe 7:39 pm on 10/18/2022 by  text page via the [facility] messaging system. Electronically Signed   By: Ulyses Jarred M.D.   On: 10/18/2022 19:40    Procedures Procedures    CRITICAL CARE Performed by: Gwenyth Allegra Arren Laminack Total critical care time: 45 minutes Critical care time was exclusive of separately billable procedures and treating other patients. Critical care was necessary to treat or prevent imminent or life-threatening deterioration. Critical care was time spent personally by me on the following activities: development of treatment plan with patient and/or surrogate as well as nursing, discussions with consultants, evaluation of patient's response to treatment, examination of patient, obtaining history from patient or surrogate, ordering and performing treatments and interventions, ordering and review of laboratory studies, ordering and review of radiographic studies, pulse oximetry and re-evaluation of patient's condition.   Medications Ordered in ED Medications   PHENobarbital (LUMINAL) tablet 32.4 mg (has no administration in time range)  PHENobarbital (LUMINAL) tablet 64.8 mg (has no administration in time range)  lacosamide (VIMPAT) tablet 100 mg (100 mg Oral Given 10/18/22 2304)  levETIRAcetam (KEPPRA) tablet 1,500 mg (has no administration in time range)  iohexol (OMNIPAQUE) 350 MG/ML injection 100 mL (100 mLs Intravenous Contrast Given 10/18/22 1958)  levETIRAcetam (KEPPRA) IVPB 1000 mg/100 mL premix (2,000 mg Intravenous New Bag/Given 10/18/22 2308)    ED Course/ Medical Decision Making/ A&P                             Medical Decision Making Amount and/or Complexity of Data Reviewed Labs: ordered. Radiology: ordered.  Risk Prescription drug management. Decision regarding hospitalization.    Seth [name] is a 67 y.o. male with a past medical history significant for COPD, previous stroke, carotid pseudoaneurysm, gout, seizures on Keppra and phenobarbital, hypertension, and alcohol abuse who presents for seizures with EMS.  According to EMS, patient had a seizure and they were called out.  When they arrived he had another seizure lasting approxi-5 minutes and was given IM Versed.  He then stopped his seizure and began to wake up.  He then had a second seizure in his right arm that reportedly lasted about a minute before stopping without medication.  He then had another episode upon arrival to his exam room and he was given more Versed with that stopping.  On my evaluation, his seizure has stopped and he is sluggish and postictal.  He is answering some questions but with difficulty.  On my brief exam, patient had right facial droop, right arm weakness, and right leg weakness.  Patient says he had no symptoms prior to today and thinks he went to bed at 8:30 PM last night.  He was a difficult historian unable to initially tell me a good onset time today so last normal was determined to be 8:30 PM last night when he went to bed.  Given the slow  speech and right-sided weakness, patient activated as a possible LVO code stroke.  Patient taken to CT scanner where neurology saw him and agreed with the workup.  He had CT and CTA.  He also will get EEG.  Throughout his evaluation his right arm weakness improved.  Neurology recommended MRI and admission.  They ordered seizure medicines for him and he guided EEG.  He then was able to say that he thought his symptoms began more around 6:30 PM and not yesterday.  He also said that he has not had alcohol to drink in the last day or 2  but has never had history of alcohol withdrawal seizures.  Patient will be admitted for further management of suspected stroke and multiple seizures.           Final Clinical Impression(s) / ED Diagnoses Final diagnoses:  Seizure [facility])  Neurological deficit present    Clinical Impression: 1. Seizure ([place])   2. Neurological deficit present     Disposition: Admit  This note was prepared with assistance of Dragon voice recognition software. Occasional wrong-word or sound-a-like substitutions may have occurred due to the inherent limitations of voice recognition software.     Mellie Buccellato, Gwenyth Allegra, MD 10/18/22 [phone]

## 2022-10-18 NOTE — Procedures (Signed)
Patient Name: Seth [name]  MRN: [id number]  Epilepsy Attending: Lora Havens  Referring Physician/Provider: Kerney Elbe, MD  Date: 10/18/2022 Duration:   Patient history: Acute onset of right hemiplegia and right hemineglect following 4 breakthrough seizures   Level of alertness: Awake  AEDs during EEG study: None  Technical aspects: This EEG study was done with scalp electrodes positioned according to the 10-20 International system of electrode placement. Electrical activity was reviewed with band pass filter of 1-70Hz$ , sensitivity of 7 uV/mm, display speed of 66m/sec with a 60Hz$  notched filter applied as appropriate. EEG data were recorded continuously and digitally stored.  Video monitoring was available and reviewed as appropriate.  Description: The posterior dominant rhythm consists of 8-9 Hz activity of moderate voltage (25-35 uV) seen predominantly in posterior head regions, symmetric and reactive to eye opening and eye closing. EEG showed intermittent generalized 3 to 6 Hz theta-delta slowing. Physiologic photic driving was not seen during photic stimulation.  Hyperventilation was not performed.     Of note, study was technically difficult due to significant myogenic artifact.   ABNORMALITY - Intermittent slow, generalized  IMPRESSION: This technically difficult study is suggestive of mild diffuse encephalopathy, nonspecific etiology. No seizures or epileptiform discharges were seen throughout the recording.  Please note that lack of epileptiform activity during l interictal EEG does not exclude the diagnosis of epilepsy.  Seth [name] [name]

## 2022-10-18 NOTE — Progress Notes (Signed)
EEG Tech currently with patient. Will place EEG once I am finished.

## 2022-10-19 DIAGNOSIS — G40909 Epilepsy, unspecified, not intractable, without status epilepticus: Secondary | ICD-10-CM | POA: Diagnosis not present

## 2022-10-19 DIAGNOSIS — F17200 Nicotine dependence, unspecified, uncomplicated: Secondary | ICD-10-CM | POA: Diagnosis not present

## 2022-10-19 DIAGNOSIS — F1020 Alcohol dependence, uncomplicated: Secondary | ICD-10-CM | POA: Diagnosis not present

## 2022-10-19 DIAGNOSIS — Z8673 Personal history of transient ischemic attack (TIA), and cerebral infarction without residual deficits: Secondary | ICD-10-CM | POA: Diagnosis not present

## 2022-10-19 LAB — BLOOD GAS, VENOUS
Acid-base deficit: 0.2 mmol/L (ref 0.0–2.0)
Bicarbonate: 24 mmol/L (ref 20.0–28.0)
O2 Saturation: 87.1 %
Patient temperature: 37.2
pCO2, Ven: 37 mmHg — ABNORMAL LOW (ref 44–60)
pH, Ven: 7.42 (ref 7.25–7.43)
pO2, Ven: 53 mmHg — ABNORMAL HIGH (ref 32–45)

## 2022-10-19 LAB — PROTIME-INR
INR: 1 (ref 0.8–1.2)
Prothrombin Time: 13.1 seconds (ref 11.4–15.2)

## 2022-10-19 LAB — CBC WITH DIFFERENTIAL/PLATELET
Abs Immature Granulocytes: 0.02 10*3/uL (ref 0.00–0.07)
Basophils Absolute: 0 10*3/uL (ref 0.0–0.1)
Basophils Relative: 0 %
Eosinophils Absolute: 0.1 10*3/uL (ref 0.0–0.5)
Eosinophils Relative: 2 %
HCT: 41.4 % (ref 39.0–52.0)
Hemoglobin: 13.6 g/dL (ref 13.0–17.0)
Immature Granulocytes: 0 %
Lymphocytes Relative: 23 %
Lymphs Abs: 1.9 10*3/uL (ref 0.7–4.0)
MCH: 28.8 pg (ref 26.0–34.0)
MCHC: 32.9 g/dL (ref 30.0–36.0)
MCV: 87.5 fL (ref 80.0–100.0)
Monocytes Absolute: 1.1 10*3/uL — ABNORMAL HIGH (ref 0.1–1.0)
Monocytes Relative: 14 %
Neutro Abs: 5.1 10*3/uL (ref 1.7–7.7)
Neutrophils Relative %: 61 %
Platelets: 149 10*3/uL — ABNORMAL LOW (ref 150–400)
RBC: 4.73 MIL/uL (ref 4.22–5.81)
RDW: 14.6 % (ref 11.5–15.5)
WBC: 8.3 10*3/uL (ref 4.0–10.5)
nRBC: 0 % (ref 0.0–0.2)

## 2022-10-19 LAB — BASIC METABOLIC PANEL
Anion gap: 13 (ref 5–15)
BUN: 6 mg/dL — ABNORMAL LOW (ref 8–23)
CO2: 23 mmol/L (ref 22–32)
Calcium: 9 mg/dL (ref 8.9–10.3)
Chloride: 102 mmol/L (ref 98–111)
Creatinine, Ser: 0.96 mg/dL (ref 0.61–1.24)
GFR, Estimated: >60 mL/min (ref >60)
Glucose, Bld: 87 mg/dL (ref 70–99)
Potassium: 3.6 mmol/L (ref 3.5–5.1)
Sodium: 138 mmol/L (ref 135–145)

## 2022-10-19 LAB — HIV ANTIBODY (ROUTINE TESTING W REFLEX): HIV Screen 4th Generation wRfx: NONREACTIVE

## 2022-10-19 MED ORDER — MOMETASONE FURO-FORMOTEROL FUM 200-5 MCG/ACT IN AERO
2.0000 | INHALATION_SPRAY | Freq: Two times a day (BID) | RESPIRATORY_TRACT | Status: DC
  Administered 2022-10-19 – 2022-10-20 (×3): 2 via RESPIRATORY_TRACT
  Filled 2022-10-19: qty 8.8

## 2022-10-19 MED ORDER — METOPROLOL TARTRATE 50 MG PO TABS
50.0000 mg | ORAL_TABLET | Freq: Two times a day (BID) | ORAL | Status: DC
  Administered 2022-10-19 – 2022-10-20 (×4): 50 mg via ORAL
  Filled 2022-10-19 (×4): qty 1

## 2022-10-19 MED ORDER — ENOXAPARIN SODIUM 40 MG/0.4ML IJ SOSY
40.0000 mg | PREFILLED_SYRINGE | Freq: Every day | INTRAMUSCULAR | Status: DC
  Administered 2022-10-19 – 2022-10-20 (×2): 40 mg via SUBCUTANEOUS
  Filled 2022-10-19 (×2): qty 0.4

## 2022-10-19 MED ORDER — PANTOPRAZOLE SODIUM 40 MG PO TBEC
40.0000 mg | DELAYED_RELEASE_TABLET | Freq: Every day | ORAL | Status: DC
  Administered 2022-10-19 – 2022-10-20 (×2): 40 mg via ORAL
  Filled 2022-10-19 (×2): qty 1

## 2022-10-19 MED ORDER — UMECLIDINIUM BROMIDE 62.5 MCG/ACT IN AEPB
1.0000 | INHALATION_SPRAY | Freq: Every day | RESPIRATORY_TRACT | Status: DC
  Administered 2022-10-19 – 2022-10-20 (×2): 1 via RESPIRATORY_TRACT
  Filled 2022-10-19: qty 7

## 2022-10-19 MED ORDER — CLOPIDOGREL BISULFATE 75 MG PO TABS
75.0000 mg | ORAL_TABLET | Freq: Every day | ORAL | Status: DC
  Administered 2022-10-19 – 2022-10-20 (×2): 75 mg via ORAL
  Filled 2022-10-19 (×2): qty 1

## 2022-10-19 MED ORDER — SERTRALINE HCL 50 MG PO TABS
25.0000 mg | ORAL_TABLET | Freq: Every day | ORAL | Status: DC
  Administered 2022-10-19 – 2022-10-20 (×2): 25 mg via ORAL
  Filled 2022-10-19 (×3): qty 1
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
## 2022-10-19 NOTE — Progress Notes (Signed)
PROGRESS NOTE    Seth [name]  [id number] DOB: May 11, 1956 DOA: 10/18/2022 PCP: Lucianne Lei, MD   Brief Narrative:  67 year old male with past medical history of seizures, alcohol abuse, history of a stroke, hypertension depression, and gout presented with seizures.  On presentation, he was confused with right-sided facial drooping and right arm and leg went limp with sluggish speech.  Code stroke was called.  Neurology was consulted.  On contrast CT of the head was negative for acute intracranial abnormity.  CTA of head and neck showed no large vessel occlusion but showed pseudoaneurysm of bilateral cervical ICA.  Assessment & Plan:   Breakthrough seizures in a patient with history of seizure disorder Possible noncompliance to medications -Neurology following.  Unclear if his seizures are from combination of noncompliance/alcohol use or breakthrough seizures -Currently on Keppra, Lamictal and phenobarbital.  MRI brain was negative for acute intracranial abnormality -Seizure precautions.  Fall precautions.  PT/OT/SLP eval. -More awake this morning, slightly confused to time.  Alcohol abuse -TOC consult.  Continue CIWA protocol along with multivitamin, folic acid and thiamine.  History of unspecified CVA--MRI brain as above.  Continue Plavix.  Resume Crestor if able to swallow properly.  Outpatient follow-up with neurology.  Depression -Continue sertraline.  Hypertension -Monitor blood pressure.  Continue metoprolol  COPD -Stable.  Continue current inhaled regimen.  Thrombocytopenia -No signs of bleeding.  Monitor intermittently.  Physical deconditioning -PT eval  Tobacco use disorder -Continue nicotine patch  DVT prophylaxis: SCDs Code Status: Full Family Communication: None at bedside Disposition Plan: Status is: Inpatient Remains inpatient appropriate because: Of severity of illness  Consultants: Neurology  Procedures: None  Antimicrobials:  None   Subjective: Patient seen and examined at bedside.  Awake, answers some questions, slightly confused to time.  No fever, vomiting, agitation reported.  Objective: Vitals:   10/19/22 0215 10/19/22 0217 10/19/22 0236 10/19/22 0734  BP:   (!) 142/88 (!) 147/85  Pulse: 93  (!) 101 84  Resp: 20  14 20  $ Temp:  98.5 F (36.9 C) 98.9 F (37.2 C) 98.4 F (36.9 C)  TempSrc:  Oral Oral Oral  SpO2: 99%  97% 97%  Weight:      Height:        Intake/Output Summary (Last 24 hours) at 10/19/2022 1057 Last data filed at 10/19/2022 0036 Gross per 24 hour  Intake 92.34 ml  Output --  Net 92.34 ml   Filed Weights   10/18/22 1906  Weight: 81.6 kg    Examination:  General exam: Appears calm and comfortable.  Looks chronically ill and deconditioned.  Currently on room air. Respiratory system: Bilateral decreased breath sounds at bases with some scattered crackles Cardiovascular system: S1 & S2 heard, mild intermittent tachycardia present gastrointestinal system: Abdomen is nondistended, soft and nontender. Normal bowel sounds heard. Extremities: No cyanosis, clubbing, edema  Central nervous system: Awake, slow to respond, confused to time.  Poor historian.  No focal neurological deficits. Moving extremities Skin: No rashes, lesions or ulcers Psychiatry: Not agitated.  Flat affect.    Data Reviewed: I have personally reviewed following labs and imaging studies  CBC: Recent Labs  Lab 10/18/22 1944 10/18/22 2211 10/19/22 0419  WBC 8.9  --  8.3  NEUTROABS 7.3  --  5.1  HGB 13.7 16.3 13.6  HCT 42.7 48.0 41.4  MCV 89.9  --  87.5  PLT 163  --  123456*   Basic Metabolic Panel: Recent Labs  Lab 10/18/22 2200 10/18/22 2211 10/19/22  0419  NA 138 138 138  K 3.9 4.0 3.6  CL 101 102 102  CO2 24  --  23  GLUCOSE 93 92 87  BUN 8 8 6*  CREATININE 1.13 1.00 0.96  CALCIUM 9.3  --  9.0   GFR: Estimated Creatinine Clearance: 88.5 mL/min (by C-G formula based on SCr of 0.96  mg/dL). Liver Function Tests: Recent Labs  Lab 10/18/22 2200  AST 40  ALT 18  ALKPHOS 200*  BILITOT 0.9  PROT 7.1  ALBUMIN 3.8   No results for input(s): "LIPASE", "AMYLASE" in the last 168 hours. No results for input(s): "AMMONIA" in the last 168 hours. Coagulation Profile: Recent Labs  Lab 10/18/22 2200 10/19/22 0419  INR 1.0 1.0   Cardiac Enzymes: No results for input(s): "CKTOTAL", "CKMB", "CKMBINDEX", "TROPONINI" in the last 168 hours. BNP (last 3 results) No results for input(s): "PROBNP" in the last 8760 hours. HbA1C: No results for input(s): "HGBA1C" in the last 72 hours. CBG: No results for input(s): "GLUCAP" in the last 168 hours. Lipid Profile: No results for input(s): "CHOL", "HDL", "LDLCALC", "TRIG", "CHOLHDL", "LDLDIRECT" in the last 72 hours. Thyroid Function Tests: No results for input(s): "TSH", "T4TOTAL", "FREET4", "T3FREE", "THYROIDAB" in the last 72 hours. Anemia Panel: No results for input(s): "VITAMINB12", "FOLATE", "FERRITIN", "TIBC", "IRON", "RETICCTPCT" in the last 72 hours. Sepsis Labs: No results for input(s): "PROCALCITON", "LATICACIDVEN" in the last 168 hours.  No results found for this or any previous visit (from the past 240 hour(s)).       Radiology Studies: MR BRAIN WO CONTRAST  Result Date: 10/18/2022 CLINICAL DATA:  Acute neurologic deficit EXAM: MRI HEAD WITHOUT CONTRAST TECHNIQUE: Multiplanar, multiecho pulse sequences of the brain and surrounding structures were obtained without intravenous contrast. COMPARISON:  None Available. FINDINGS: Brain: No acute infarct, mass effect or extra-axial collection. No acute or chronic hemorrhage. There is multifocal hyperintense T2-weighted signal within the white matter. Advanced volume loss for age. 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 The midline structures are normal. Vascular: Major flow voids are preserved. Skull and upper cervical spine: Normal calvarium and skull base.  Visualized upper cervical spine and soft tissues are normal. Sinuses/Orbits:No paranasal sinus fluid levels or advanced mucosal thickening. No mastoid or middle ear effusion. Normal orbits. IMPRESSION: 1. No acute intracranial abnormality. 2. 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 3. Advanced volume loss. Electronically Signed   By: Ulyses Jarred M.D.   On: 10/18/2022 23:56   EEG adult  Result Date: 10/18/2022 Lora Havens, MD     10/18/2022 10:02 PM Patient Name: Seth [name] MRN: [id number] Epilepsy Attending: Lora Havens Referring Physician/Provider: Kerney Elbe, MD Date: 10/18/2022 Duration: Patient history: Acute onset of right hemiplegia and right hemineglect following 4 breakthrough seizures Level of alertness: Awake AEDs during EEG study: None Technical aspects: This EEG study was done with scalp electrodes positioned according to the 10-20 International system of electrode placement. Electrical activity was reviewed with band pass filter of 1-70Hz$ , sensitivity of 7 uV/mm, display speed of 67m/sec with a 60Hz$  notched filter applied as appropriate. EEG data were recorded continuously and digitally stored.  Video monitoring was available and reviewed as appropriate. Description: The posterior dominant rhythm consists of 8-9 Hz activity of moderate voltage (25-35 uV) seen predominantly in posterior head regions, symmetric and reactive to eye opening and eye closing. EEG showed intermittent generalized 3 to 6 Hz theta-delta slowing. Physiologic photic driving  was not seen during photic stimulation.  Hyperventilation was not performed.   Of note, study was technically difficult due to significant myogenic artifact. ABNORMALITY - Intermittent slow, generalized IMPRESSION: This technically difficult study is suggestive of mild diffuse encephalopathy, nonspecific etiology. No seizures or epileptiform discharges were seen throughout the  recording. Please note that lack of epileptiform activity during l interictal EEG does not exclude the diagnosis of epilepsy. Priyanka [name]   CT ANGIO HEAD NECK W WO CM W PERF (CODE STROKE)  Result Date: 10/18/2022 CLINICAL DATA:  Neuro deficit, acute, stroke suspected.  Seizures. EXAM: CT ANGIOGRAPHY HEAD AND NECK CT PERFUSION BRAIN TECHNIQUE: Multidetector CT imaging of the head and neck was performed using the standard protocol during bolus administration of intravenous contrast. Multiplanar CT image reconstructions and MIPs were obtained to evaluate the vascular anatomy. Carotid stenosis measurements (when applicable) are obtained utilizing NASCET criteria, using the distal internal carotid diameter as the denominator. Multiphase CT imaging of the brain was performed following IV bolus contrast injection. Subsequent parametric perfusion maps were calculated using RAPID software. RADIATION DOSE REDUCTION: This exam was performed according to the departmental dose-optimization program which includes automated exposure control, adjustment of the mA and/or kV according to patient size and/or use of iterative reconstruction technique. CONTRAST:  114m OMNIPAQUE IOHEXOL 350 MG/ML SOLN COMPARISON:  Head CT immediately prior. Previous CT angiography 07/19/2021. FINDINGS: CTA NECK FINDINGS Aortic arch: Aortic atherosclerosis.  Branching pattern is normal. Right carotid system: Common carotid artery widely patent to the bifurcation. Calcified plaque at the carotid bifurcation and ICA bulb. Minimal diameter in the bulb measures 4.5 mm. 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 No flow limiting stenosis in this location. Left carotid system: Common carotid artery widely patent to the bifurcation. Calcified plaque at the carotid bifurcation and ICA bulb. No stenosis. Redemonstrated is a large pseudoaneurysm of the cervical ICA measuring up to 15 mm in  diameter. Mild narrowing of the true lumen of the vessel adjacent to that, with stenosis of 30%. Irregularity of the lumen could serve as a source of embolic disease. The appearance is not visibly changed since November of 2022. Vertebral arteries: The vertebral arteries are small vessels. There is plaque at the right vertebral artery origin with stenosis of 30%. No left vertebral artery origin stenosis. Both vertebral arteries are patent through the cervical region to the foramen magnum. Skeleton: Previous ACDF C3-4. Other neck: No mass or lymphadenopathy. Upper chest: Mild emphysema at the lung apices. Review of the MIP images confirms the above findings CTA HEAD FINDINGS Anterior circulation: These both internal carotid arteries are patent through the skull base and siphon regions. Extensive siphon atherosclerotic calcification with stenosis estimated at 30% on both sides. The anterior and middle cerebral vessels are patent. No large vessel occlusion or correctable proximal stenosis. Fetal origin of both posterior cerebral arteries. Posterior circulation: Both vertebral arteries are patent through the foramen magnum to the basilar artery. Basilar artery is small because of fetal origin of both posterior cerebral arteries. Posteroinferior cerebellar arteries and superior cerebellar arteries show flow. Venous sinuses: Patent and normal. Anatomic variants: None other significant. Review of the MIP images confirms the above findings CT Brain Perfusion Findings: ASPECTS: 10 CBF (<30%) Volume: 073mPerfusion (Tmax>6.0s) volume: 1088mismatch Volume: 95m45mfarction Location:No actual infarction. T-max greater than 6 in the left parietal region. IMPRESSION: 1. No acute large vessel occlusion or correctable proximal stenosis. 2. 10 mL region of ischemic  penumbra in the left parietal region. No actual core infarction. 3. 15 mm pseudoaneurysm of the cervical ICA on the left with mild narrowing of the true lumen adjacent to  that. This could serve as a source of embolic disease. 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 The appearance is not visibly changed since November of 2022. 4. 30% stenosis of the right vertebral artery origin. 5. Atherosclerotic change at both carotid bifurcations but without stenosis. 6. Atherosclerotic change in both carotid siphon regions with stenosis estimated at 30% on both sides. 7. Fetal origin of both posterior cerebral arteries. 8. Aortic atherosclerosis. Aortic Atherosclerosis (ICD10-I70.0). Electronically Signed   By: Nelson Chimes M.D.   On: 10/18/2022 20:20   CT HEAD CODE STROKE WO CONTRAST  Result Date: 10/18/2022 CLINICAL DATA:  Code stroke.  Right-sided weakness EXAM: CT HEAD WITHOUT CONTRAST TECHNIQUE: Contiguous axial images were obtained from the base of the skull through the vertex without intravenous contrast. RADIATION DOSE REDUCTION: This exam was performed according to the departmental dose-optimization program which includes automated exposure control, adjustment of the mA and/or kV according to patient size and/or use of iterative reconstruction technique. COMPARISON:  05/19/2022 FINDINGS: Brain: There is no mass, hemorrhage or extra-axial collection. There is generalized atrophy without lobar predilection. There is hypoattenuation of the periventricular white matter, most commonly indicating chronic ischemic microangiopathy. Old bilateral cerebellar infarcts. Vascular: Atherosclerotic calcification of the internal carotid arteries at the skull base. No abnormal hyperdensity of the major intracranial arteries or dural venous sinuses. Skull: The visualized skull base, calvarium and extracranial soft tissues are normal. Sinuses/Orbits: No fluid levels or advanced mucosal thickening of the visualized paranasal sinuses. No mastoid or middle ear effusion. The orbits are normal. ASPECTS [facility] Stroke Program Early CT Score) - Ganglionic level  infarction (caudate, lentiform nuclei, internal capsule, insula, M1-M3 cortex): 7 - Supraganglionic infarction (M4-M6 cortex): 3 Total score (0-10 with 10 being normal): 10 IMPRESSION: 1. No acute intracranial abnormality. 2. ASPECTS is 10. 3. Old bilateral cerebellar infarcts. These results were communicated to Dr. Kerney Elbe at 7:39 pm on 10/18/2022 by text page via the [facility] messaging system. Electronically Signed   By: Ulyses Jarred M.D.   On: 10/18/2022 19:40        Scheduled Meds:  clopidogrel  75 mg Oral Daily   enoxaparin (LOVENOX) injection  40 mg Subcutaneous Daily   folic acid  1 mg Oral Daily   lacosamide  100 mg Oral BID   levETIRAcetam  1,500 mg Oral BID   LORazepam  0-4 mg Intravenous Q6H   Followed by   Derrill Memo ON 10/21/2022] LORazepam  0-4 mg Intravenous Q12H   metoprolol tartrate  50 mg Oral BID   mometasone-formoterol  2 puff Inhalation BID   multivitamin with minerals  1 tablet Oral Daily   nicotine  14 mg Transdermal Daily   pantoprazole  40 mg Oral Daily   phenobarbital  32.4 mg Oral q morning   PHENobarbital  64.8 mg Oral QHS   sertraline  25 mg Oral Daily   thiamine  100 mg Oral Daily   Or   thiamine  100 mg Intravenous Daily   umeclidinium bromide  1 puff Inhalation Daily   Continuous Infusions:        Aline August, MD [facility] 10/19/2022, 10:57 AM

## 2022-10-19 NOTE — Progress Notes (Signed)
Subjective: No further seizures overnight.  States he lives independently, does admit to drinking alcohol and is not sure what seizure medications he takes.  ROS: negative except above  Examination  Vital signs in last 24 hours: Temp:  [98.1 F (36.7 C)-98.9 F (37.2 C)] 98.2 F (36.8 C) (02/22 1136) Pulse Rate:  [79-130] 79 (02/22 1136) Resp:  [13-35] 23 (02/22 1136) BP: (142-159)/(84-100) 152/90 (02/22 1136) SpO2:  [96 %-100 %] 100 % (02/22 1136) Weight:  [81.6 kg] 81.6 kg (02/21 1906)  General: lying in bed, NAD Neuro: MS: Alert, oriented to time place and person, follows commands CN: pupils equal and reactive,  EOMI, face symmetric, tongue midline, normal sensation over face, no neglect Motor: 4/5 strength in right upper extremity, 3/5 in right lower extremity, 5/5 in left upper and left lower extremity  Basic Metabolic Panel: Recent Labs  Lab 10/18/22 2200 10/18/22 2211 10/19/22 0419  NA 138 138 138  K 3.9 4.0 3.6  CL 101 102 102  CO2 24  --  23  GLUCOSE 93 92 87  BUN 8 8 6*  CREATININE 1.13 1.00 0.96  CALCIUM 9.3  --  9.0    CBC: Recent Labs  Lab 10/18/22 1944 10/18/22 2211 10/19/22 0419  WBC 8.9  --  8.3  NEUTROABS 7.3  --  5.1  HGB 13.7 16.3 13.6  HCT 42.7 48.0 41.4  MCV 89.9  --  87.5  PLT 163  --  149*     Coagulation Studies: Recent Labs    10/18/22 2200 10/19/22 0419  LABPROT 12.9 13.1  INR 1.0 1.0    Imaging MRI brain without contrast 10/18/2022: No acute intracranial abnormality. 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  Advanced volume loss.  ASSESSMENT AND PLAN: 67 year old male with history of stroke, epilepsy (on Keppra, Vimpat and phenobarbital), alcohol use disorder who presented with breakthrough seizures in the setting of alcohol use and likely medication noncompliance.  Epilepsy with breakthrough seizure Todd's paralysis, improving Alcohol use disorder Chronic  stroke -No seizures on EEG.  Mental status also improving.  Weakness improving  Recommendations -Continue current AEDs: Keppra 1500 mg twice daily, Vimpat 100 mg twice daily and phenobarb 32.4 mg every morning and 64.8 mg nightly. -Discussed importance of medication compliance as well as alcohol cessation with patient -Continue CIWA protocol -Okay to discharge from neurology standpoint once stable -Continue to follow-up with Dr. April Manson at [facility] neurology -Seizure precautions including do not drive  Seizure precautions: Per [facility] statutes, patients with seizures are not allowed to drive until they have been seizure-free for six months and cleared by a physician    Use caution when using heavy equipment or power tools. Avoid working on ladders or at heights. Take showers instead of baths. Ensure the water temperature is not too high on the home water heater. Do not go swimming alone. Do not lock yourself in a room alone (i.e. bathroom). When caring for infants or small children, sit down when holding, feeding, or changing them to minimize risk of injury to the child in the event you have a seizure. Maintain good sleep hygiene. Avoid alcohol.    If patient has another seizure, call 911 and bring them back to the ED if: A.  The seizure lasts longer than 5 minutes.      B.  The patient doesn't wake shortly after the seizure or has new problems such as difficulty seeing, speaking or moving  following the seizure C.  The patient was injured during the seizure D.  The patient has a temperature over 102 F (39C) E.  The patient vomited during the seizure and now is having trouble breathing    During the Seizure   - First, ensure adequate ventilation and place patients on the floor on their left side  Loosen clothing around the neck and ensure the airway is patent. If the patient is clenching the teeth, do not force the mouth open with any object as this can cause severe damage -  Remove all items from the surrounding that can be hazardous. The patient may be oblivious to what's happening and may not even know what he or she is doing. If the patient is confused and wandering, either gently guide him/her away and block access to outside areas - Reassure the individual and be comforting - Call 911. In most cases, the seizure ends before EMS arrives. However, there are cases when seizures may last over 3 to 5 minutes. Or the individual may have developed breathing difficulties or severe injuries. If a pregnant patient or a person with diabetes develops a seizure, it is prudent to call an ambulance. - Finally, if the patient does not regain full consciousness, then call EMS. Most patients will remain confused for about 45 to 90 minutes after a seizure, so you must use judgment in calling for help.    After the Seizure (Postictal Stage)   After a seizure, most patients experience confusion, fatigue, muscle pain and/or a headache. Thus, one should permit the individual to sleep. For the next few days, reassurance is essential. Being calm and helping reorient the person is also of importance.   Most seizures are painless and end spontaneously. Seizures are not harmful to others but can lead to complications such as stress on the lungs, brain and the heart. Individuals with prior lung problems may develop labored breathing and respiratory distress.     I have spent a total of 36  minutes with the patient reviewing hospital notes,  test results, labs and examining the patient as well as establishing an assessment and plan that was discussed personally with the patient.  > 50% of time was spent in direct patient care.    Zeb Comfort Epilepsy [facility] For questions after 5pm please refer to AMION to reach the Neurologist on call

## 2022-10-20 LAB — CBC WITH DIFFERENTIAL/PLATELET
Abs Immature Granulocytes: 0.04 10*3/uL (ref 0.00–0.07)
Basophils Absolute: 0 10*3/uL (ref 0.0–0.1)
Basophils Relative: 1 %
Eosinophils Absolute: 0.2 10*3/uL (ref 0.0–0.5)
Eosinophils Relative: 3 %
HCT: 41.8 % (ref 39.0–52.0)
Hemoglobin: 14 g/dL (ref 13.0–17.0)
Immature Granulocytes: 1 %
Lymphocytes Relative: 28 %
Lymphs Abs: 1.7 10*3/uL (ref 0.7–4.0)
MCH: 29.1 pg (ref 26.0–34.0)
MCHC: 33.5 g/dL (ref 30.0–36.0)
MCV: 86.9 fL (ref 80.0–100.0)
Monocytes Absolute: 0.8 10*3/uL (ref 0.1–1.0)
Monocytes Relative: 13 %
Neutro Abs: 3.4 10*3/uL (ref 1.7–7.7)
Neutrophils Relative %: 54 %
Platelets: 123 10*3/uL — ABNORMAL LOW (ref 150–400)
RBC: 4.81 MIL/uL (ref 4.22–5.81)
RDW: 14.8 % (ref 11.5–15.5)
WBC: 6.2 10*3/uL (ref 4.0–10.5)
nRBC: 0 % (ref 0.0–0.2)

## 2022-10-20 LAB — COMPREHENSIVE METABOLIC PANEL
ALT: 15 U/L (ref 0–44)
AST: 36 U/L (ref 15–41)
Albumin: 3.4 g/dL — ABNORMAL LOW (ref 3.5–5.0)
Alkaline Phosphatase: 155 U/L — ABNORMAL HIGH (ref 38–126)
Anion gap: 13 (ref 5–15)
BUN: 7 mg/dL — ABNORMAL LOW (ref 8–23)
CO2: 22 mmol/L (ref 22–32)
Calcium: 9.1 mg/dL (ref 8.9–10.3)
Chloride: 104 mmol/L (ref 98–111)
Creatinine, Ser: 1.07 mg/dL (ref 0.61–1.24)
GFR, Estimated: >60 mL/min (ref >60)
Glucose, Bld: 93 mg/dL (ref 70–99)
Potassium: 4.2 mmol/L (ref 3.5–5.1)
Sodium: 139 mmol/L (ref 135–145)
Total Bilirubin: 0.8 mg/dL (ref 0.3–1.2)
Total Protein: 6.9 g/dL (ref 6.5–8.1)

## 2022-10-20 LAB — MAGNESIUM: Magnesium: 1.8 mg/dL (ref 1.7–2.4)

## 2022-10-20 LAB — LEVETIRACETAM LEVEL: Levetiracetam Lvl: 33.1 ug/mL (ref 10.0–40.0)

## 2022-10-20 LAB — LAMOTRIGINE LEVEL: Lamotrigine Lvl: <1 ug/mL — ABNORMAL LOW (ref 2.0–20.0)

## 2022-10-20 NOTE — Progress Notes (Signed)
Patient does not want to come and getr the patient. They want [place] to help place patient into a snf or rehab. MD  and case management aware.

## 2022-10-20 NOTE — Progress Notes (Signed)
Called family and  asked when they would be able to come and get patient. Daughter said she was setting up transport now

## 2022-10-20 NOTE — Discharge Summary (Signed)
Physician Discharge Summary  Seth [name] [id number] DOB: [birth year] DOA: 10/18/2022  PCP: Lucianne Lei, MD  Admit date: 10/18/2022 Discharge date: 10/20/2022  Admitted From: Home Disposition: Home  Recommendations for Outpatient Follow-up:  Follow up with PCP in 1 week with repeat CBC/BMP Outpatient follow-up with neurology About with medications and follow-up.  Abstain from alcohol. Follow up in ED if symptoms worsen or new appear   Home Health: No Equipment/Devices: None  Discharge Condition: Stable CODE STATUS: Full Diet recommendation: Heart healthy  Brief/Interim Summary: 67 year old male with past medical history of seizures, alcohol abuse, history of a stroke, hypertension depression, and gout presented with seizures.  On presentation, he was confused with right-sided facial drooping and right arm and leg went limp with sluggish speech.  Code stroke was called.  Neurology was consulted.  On contrast CT of the head was negative for acute intracranial abnormity.  CTA of head and neck showed no large vessel occlusion but showed pseudoaneurysm of bilateral cervical ICA.  Given the hospitalization, EEG did not show any seizures.  His condition improved mental status and weakness are improving.  Neurology signed off on 10/19/2022 and recommended to continue Keppra/Vimpat/phenobarb on discharge with outpatient follow-up with neurology.  He will be discharged home today with outpatient follow-up with PCP and neurology.  Discharge Diagnoses:   Breakthrough seizures in a patient with history of seizure disorder Possible noncompliance to medications -Unclear if his seizures are from combination of noncompliance/alcohol use or breakthrough seizures -Currently on Keppra, Lamictal and phenobarbital.  MRI brain was negative for acute intracranial abnormality. -Seizure precautions.   -Neurology signed off on 10/19/2022 and recommended to continue Keppra/Vimpat/phenobarb on discharge with  outpatient follow-up with neurology.  He will be discharged home today with outpatient follow-up with PCP and neurology. -No seizures since admission.  Mental status is much improved.   -Diet as per SLP recommendations.  Alcohol abuse -TOC consulted.  Treated with CIWA.  Continue multivitamin, folic acid and thiamine on discharge.   History of unspecified CVA--MRI brain as above.  Continue aspirin, Plavix and statin.  Outpatient follow-up with neurology.   Depression -Continue sertraline.   Hypertension -Monitor blood pressure.  Continue metoprolol   COPD -Stable.  Continue current inhaled regimen.   Thrombocytopenia -No signs of bleeding.  Monitor intermittently.   Tobacco use disorder -Continue nicotine patch  Discharge Instructions  Discharge Instructions     Ambulatory referral to Neurology   Complete by: As directed    An appointment is requested in approximately: 1 week   Diet - low sodium heart healthy   Complete by: As directed    Increase activity slowly   Complete by: As directed       Allergies as of 10/20/2022   No Known Allergies      Medication List     STOP taking these medications    pantoprazole 40 MG tablet Commonly known as: PROTONIX       TAKE these medications    acetaminophen 325 MG tablet Commonly known as: TYLENOL Take 2 tablets (650 mg total) by mouth every 6 (six) hours as needed for headache or mild pain.   clopidogrel 75 MG tablet Commonly known as: PLAVIX Take 1 tablet (75 mg total) by mouth daily.   cyanocobalamin 1000 MCG tablet Commonly known as: VITAMIN B12 Take 1,000 mcg by mouth every other day.   Daily Vitamin Formula+Minerals Tabs Take 1 tablet by mouth daily.   EQ Aspirin Adult Low Dose 81 MG tablet Generic  drug: aspirin EC Take 81 mg by mouth daily.   feeding supplement Liqd Take 237 mLs by mouth 3 (three) times daily between meals.   FeroSul 325 (65 FE) MG tablet Generic drug: ferrous sulfate Take  325 mg by mouth daily.   fluticasone-salmeterol 500-50 MCG/ACT Aepb Commonly known as: ADVAIR Inhale 1 puff into the lungs 2 (two) times daily.   folic acid 1 MG tablet Commonly known as: FOLVITE Take 1 mg by mouth daily.   gabapentin 300 MG capsule Commonly known as: NEURONTIN Take 300 mg by mouth 3 (three) times daily.   Incruse Ellipta 62.5 MCG/ACT Aepb Generic drug: umeclidinium bromide INHALE 1 PUFF INTO THE LUNGS DAILY What changed: when to take this   Lacosamide 100 MG Tabs Take 1 tablet (100 mg total) by mouth 2 (two) times daily.   levETIRAcetam 750 MG tablet Commonly known as: KEPPRA Take 2 tablets (1,500 mg total) by mouth 2 (two) times daily.   metoprolol tartrate 50 MG tablet Commonly known as: LOPRESSOR Take 1 tablet (50 mg total) by mouth 2 (two) times daily.   omeprazole 40 MG capsule Commonly known as: PRILOSEC Take 40 mg by mouth daily.   PHENobarbital 32.4 MG tablet Commonly known as: LUMINAL Take 1 tablet (32.4 mg total) by mouth in the morning.   PHENobarbital 64.8 MG tablet Commonly known as: LUMINAL Take 1 tablet (64.8 mg total) by mouth at bedtime.   rosuvastatin 10 MG tablet Commonly known as: CRESTOR Take 1 tablet (10 mg total) by mouth daily.   sertraline 25 MG tablet Commonly known as: ZOLOFT Take 25 mg by mouth daily.   thiamine 100 MG tablet Commonly known as: VITAMIN B1 Take 100 mg by mouth daily.   Zinc Sulfate 220 (50 Zn) MG Tabs Take 1 tablet by mouth daily.        Follow-up Information     Lucianne Lei, MD. Schedule an appointment as soon as possible for a visit in 1 week(s).   Specialty: Family Medicine Contact information: [address] [place] [address] [phone]                No Known Allergies  Consultations: Neurology   Procedures/Studies: MR BRAIN WO CONTRAST  Result Date: 10/18/2022 CLINICAL DATA:  Acute neurologic deficit EXAM: MRI HEAD WITHOUT CONTRAST TECHNIQUE:  Multiplanar, multiecho pulse sequences of the brain and surrounding structures were obtained without intravenous contrast. COMPARISON:  None Available. FINDINGS: Brain: No acute infarct, mass effect or extra-axial collection. No acute or chronic hemorrhage. There is multifocal hyperintense T2-weighted signal within the white matter. Advanced volume loss for age. 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 The midline structures are normal. Vascular: Major flow voids are preserved. Skull and upper cervical spine: Normal calvarium and skull base. Visualized upper cervical spine and soft tissues are normal. Sinuses/Orbits:No paranasal sinus fluid levels or advanced mucosal thickening. No mastoid or middle ear effusion. Normal orbits. IMPRESSION: 1. No acute intracranial abnormality. 2. 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 3. Advanced volume loss. Electronically Signed   By: Ulyses Jarred M.D.   On: 10/18/2022 23:56   EEG adult  Result Date: 10/18/2022 Lora Havens, MD     10/18/2022 10:02 PM Patient Name: Seth [name] MRN: [id number] Epilepsy Attending: Lora Havens Referring Physician/Provider: Kerney Elbe, MD Date: 10/18/2022 Duration: Patient history: Acute onset of right hemiplegia and right hemineglect following 4 breakthrough seizures Level  of alertness: Awake AEDs during EEG study: None Technical aspects: This EEG study was done with scalp electrodes positioned according to the 10-20 International system of electrode placement. Electrical activity was reviewed with band pass filter of 1-'70Hz'$ , sensitivity of 7 uV/mm, display speed of 2m/sec with a '60Hz'$  notched filter applied as appropriate. EEG data were recorded continuously and digitally stored.  Video monitoring was available and reviewed as appropriate. Description: The posterior dominant rhythm consists of 8-9 Hz activity of moderate voltage (25-35 uV) seen  predominantly in posterior head regions, symmetric and reactive to eye opening and eye closing. EEG showed intermittent generalized 3 to 6 Hz theta-delta slowing. Physiologic photic driving was not seen during photic stimulation.  Hyperventilation was not performed.   Of note, study was technically difficult due to significant myogenic artifact. ABNORMALITY - Intermittent slow, generalized IMPRESSION: This technically difficult study is suggestive of mild diffuse encephalopathy, nonspecific etiology. No seizures or epileptiform discharges were seen throughout the recording. Please note that lack of epileptiform activity during l interictal EEG does not exclude the diagnosis of epilepsy. Priyanka [name]  CT ANGIO HEAD NECK W WO CM W PERF (CODE STROKE)  Result Date: 10/18/2022 CLINICAL DATA:  Neuro deficit, acute, stroke suspected.  Seizures. EXAM: CT ANGIOGRAPHY HEAD AND NECK CT PERFUSION BRAIN TECHNIQUE: Multidetector CT imaging of the head and neck was performed using the standard protocol during bolus administration of intravenous contrast. Multiplanar CT image reconstructions and MIPs were obtained to evaluate the vascular anatomy. Carotid stenosis measurements (when applicable) are obtained utilizing NASCET criteria, using the distal internal carotid diameter as the denominator. Multiphase CT imaging of the brain was performed following IV bolus contrast injection. Subsequent parametric perfusion maps were calculated using RAPID software. RADIATION DOSE REDUCTION: This exam was performed according to the departmental dose-optimization program which includes automated exposure control, adjustment of the mA and/or kV according to patient size and/or use of iterative reconstruction technique. CONTRAST:  1031mOMNIPAQUE IOHEXOL 350 MG/ML SOLN COMPARISON:  Head CT immediately prior. Previous CT angiography 07/19/2021. FINDINGS: CTA NECK FINDINGS Aortic arch: Aortic atherosclerosis.  Branching pattern is normal.  Right carotid system: Common carotid artery widely patent to the bifurcation. Calcified plaque at the carotid bifurcation and ICA bulb. Minimal diameter in the bulb measures 4.5 mm. 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 No flow limiting stenosis in this location. Left carotid system: Common carotid artery widely patent to the bifurcation. Calcified plaque at the carotid bifurcation and ICA bulb. No stenosis. 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 Mild narrowing of the true lumen of the vessel adjacent to that, with stenosis of 30%. Irregularity of the lumen could serve as a source of embolic disease. The appearance is not visibly changed since November of 2022. Vertebral arteries: The vertebral arteries are small vessels. There is plaque at the right vertebral artery origin with stenosis of 30%. No left vertebral artery origin stenosis. Both vertebral arteries are patent through the cervical region to the foramen magnum. Skeleton: Previous ACDF C3-4. Other neck: No mass or lymphadenopathy. Upper chest: Mild emphysema at the lung apices. Review of the MIP images confirms the above findings CTA HEAD FINDINGS Anterior circulation: These both internal carotid arteries are patent through the skull base and siphon regions. Extensive siphon atherosclerotic calcification with stenosis estimated at 30% on both sides. The anterior and middle cerebral vessels are patent. No large vessel occlusion  or correctable proximal stenosis. Fetal origin of both posterior cerebral arteries. Posterior circulation: Both vertebral arteries are patent through the foramen magnum to the basilar artery. Basilar artery is small because of fetal origin of both posterior cerebral arteries. Posteroinferior cerebellar arteries and superior cerebellar arteries show flow. Venous sinuses: Patent and normal. Anatomic variants:  None other significant. Review of the MIP images confirms the above findings CT Brain Perfusion Findings: ASPECTS: 10 CBF (<30%) Volume: 79m Perfusion (Tmax>6.0s) volume: 177mMismatch Volume: 1068mnfarction Location:No actual infarction. T-max greater than 6 in the left parietal region. IMPRESSION: 1. No acute large vessel occlusion or correctable proximal stenosis. 2. 10 mL region of ischemic penumbra in the left parietal region. No actual core infarction. 3. 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 This could serve as a source of embolic disease. 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 The appearance is not visibly changed since November of 2022. 4. 30% stenosis of the right vertebral artery origin. 5. Atherosclerotic change at both carotid bifurcations but without stenosis. 6. Atherosclerotic change in both carotid siphon regions with stenosis estimated at 30% on both sides. 7. Fetal origin of both posterior cerebral arteries. 8. Aortic atherosclerosis. Aortic Atherosclerosis (ICD10-I70.0). Electronically Signed   By: MarNelson ChimesD.   On: 10/18/2022 20:20   CT HEAD CODE STROKE WO CONTRAST  Result Date: 10/18/2022 CLINICAL DATA:  Code stroke.  Right-sided weakness EXAM: CT HEAD WITHOUT CONTRAST TECHNIQUE: Contiguous axial images were obtained from the base of the skull through the vertex without intravenous contrast. RADIATION DOSE REDUCTION: This exam was performed according to the departmental dose-optimization program which includes automated exposure control, adjustment of the mA and/or kV according to patient size and/or use of iterative reconstruction technique. COMPARISON:  05/19/2022 FINDINGS: Brain: There is no mass, hemorrhage or extra-axial collection. There is generalized atrophy without lobar predilection. There is hypoattenuation of the periventricular white matter, most commonly  indicating chronic ischemic microangiopathy. Old bilateral cerebellar infarcts. Vascular: Atherosclerotic calcification of the internal carotid arteries at the skull base. No abnormal hyperdensity of the major intracranial arteries or dural venous sinuses. Skull: The visualized skull base, calvarium and extracranial soft tissues are normal. Sinuses/Orbits: No fluid levels or advanced mucosal thickening of the visualized paranasal sinuses. No mastoid or middle ear effusion. The orbits are normal. ASPECTS ([facility] Early CT Score) - Ganglionic level infarction (caudate, lentiform nuclei, internal capsule, insula, M1-M3 cortex): 7 - Supraganglionic infarction (M4-M6 cortex): 3 Total score (0-10 with 10 being normal): 10 IMPRESSION: 1. No acute intracranial abnormality. 2. ASPECTS is 10. 3. Old bilateral cerebellar infarcts. These results were communicated to Dr. EriKerney Elbe 7:39 pm on 10/18/2022 by text page via the [facility] system. Electronically Signed   By: KevUlyses JarredD.   On: 10/18/2022 19:40   DG Chest 2 View  Result Date: 10/11/2022 CLINICAL DATA:  Chest pain EXAM: CHEST - 2 VIEW COMPARISON:  10/01/2022 FINDINGS: Stable pleural thickening or small effusion at the right lung base. Hyperinflation. No consolidation or pneumothorax. No edema. Normal cardiopericardial silhouette. On the frontal view of the left inferior costophrenic angle is clipped off the edge of the film. Old right rib fractures. IMPRESSION: No significant interval change.  No acute cardiopulmonary disease Electronically Signed   By: AshJill SideD.   On: 10/11/2022 12:15   DG Chest 2 View  Result Date: 10/01/2022 CLINICAL DATA:  Weakness EXAM: CHEST - 2 VIEW COMPARISON:  05/22/2022 FINDINGS: Heart size and pulmonary vascularity are normal. Lungs are clear. Blunting of the right costophrenic angle is similar to prior study, likely pleural thickening. Right rib fractures are unchanged since prior study. No  airspace disease or consolidation in the lungs. No pleural effusions. No pneumothorax. Mediastinal contours appear intact. Postoperative changes in the cervical spine. IMPRESSION: Old right rib fractures with pleural thickening in the right costophrenic angle. No focal consolidation. Electronically Signed   By: Lucienne Capers M.D.   On: 10/01/2022 22:05      Subjective: Patient seen and examined at bedside.  Feels better and feels okay to vomited.  No seizures, vomiting, fever reported.  Discharge Exam: Vitals:   10/20/22 0310 10/20/22 0714  BP: (!) 160/89 (!) 162/92  Pulse: 76 82  Resp: 20 13  Temp: 98.2 F (36.8 C) 98.1 F (36.7 C)  SpO2: 98% 100%    General: Pt is alert, awake, not in acute distress.  On room air.  Slow to respond.  Poor historian. Cardiovascular: rate controlled, S1/S2 + Respiratory: bilateral decreased breath sounds at bases Abdominal: Soft, NT, ND, bowel sounds + Extremities: no edema, no cyanosis    The results of significant diagnostics from this hospitalization (including imaging, microbiology, ancillary and laboratory) are listed below for reference.     Microbiology: No results found for this or any previous visit (from the past 240 hour(s)).   Labs: BNP (last 3 results) No results for input(s): "BNP" in the last 8760 hours. Basic Metabolic Panel: Recent Labs  Lab 10/18/22 2200 10/18/22 2211 10/19/22 0419 10/20/22 0712  NA 138 138 138 139  K 3.9 4.0 3.6 4.2  CL 101 102 102 104  CO2 24  --  23 22  GLUCOSE 93 92 87 93  BUN 8 8 6* 7*  CREATININE 1.13 1.00 0.96 1.07  CALCIUM 9.3  --  9.0 9.1  MG  --   --   --  1.8   Liver Function Tests: Recent Labs  Lab 10/18/22 2200 10/20/22 0712  AST 40 36  ALT 18 15  ALKPHOS 200* 155*  BILITOT 0.9 0.8  PROT 7.1 6.9  ALBUMIN 3.8 3.4*   No results for input(s): "LIPASE", "AMYLASE" in the last 168 hours. No results for input(s): "AMMONIA" in the last 168 hours. CBC: Recent Labs  Lab  10/18/22 1944 10/18/22 2211 10/19/22 0419 10/20/22 0712  WBC 8.9  --  8.3 6.2  NEUTROABS 7.3  --  5.1 3.4  HGB 13.7 16.3 13.6 14.0  HCT 42.7 48.0 41.4 41.8  MCV 89.9  --  87.5 86.9  PLT 163  --  149* 123*   Cardiac Enzymes: No results for input(s): "CKTOTAL", "CKMB", "CKMBINDEX", "TROPONINI" in the last 168 hours. BNP: Invalid input(s): "POCBNP" CBG: No results for input(s): "GLUCAP" in the last 168 hours. D-Dimer No results for input(s): "DDIMER" in the last 72 hours. Hgb A1c No results for input(s): "HGBA1C" in the last 72 hours. Lipid Profile No results for input(s): "CHOL", "HDL", "LDLCALC", "TRIG", "CHOLHDL", "LDLDIRECT" in the last 72 hours. Thyroid function studies No results for input(s): "TSH", "T4TOTAL", "T3FREE", "THYROIDAB" in the last 72 hours.  Invalid input(s): "FREET3" Anemia work up No results for input(s): "VITAMINB12", "FOLATE", "FERRITIN", "TIBC", "IRON", "RETICCTPCT" in the last 72 hours. Urinalysis    Component Value Date/Time   COLORURINE BLOODY (A) 05/22/2022 1025   APPEARANCEUR TURBID (A) 05/22/2022 1025   LABSPEC  05/22/2022 1025  TEST NOT REPORTED DUE TO COLOR INTERFERENCE OF URINE PIGMENT   PHURINE  05/22/2022 1025    TEST NOT REPORTED DUE TO COLOR INTERFERENCE OF URINE PIGMENT   GLUCOSEU (A) 05/22/2022 1025    TEST NOT REPORTED DUE TO COLOR INTERFERENCE OF URINE PIGMENT   HGBUR (A) 05/22/2022 1025    TEST NOT REPORTED DUE TO COLOR INTERFERENCE OF URINE PIGMENT   BILIRUBINUR (A) 05/22/2022 1025    TEST NOT REPORTED DUE TO COLOR INTERFERENCE OF URINE PIGMENT   KETONESUR (A) 05/22/2022 1025    TEST NOT REPORTED DUE TO COLOR INTERFERENCE OF URINE PIGMENT   PROTEINUR (A) 05/22/2022 1025    TEST NOT REPORTED DUE TO COLOR INTERFERENCE OF URINE PIGMENT   NITRITE (A) 05/22/2022 1025    TEST NOT REPORTED DUE TO COLOR INTERFERENCE OF URINE PIGMENT   LEUKOCYTESUR (A) 05/22/2022 1025    TEST NOT REPORTED DUE TO COLOR INTERFERENCE OF URINE PIGMENT    Sepsis Labs Recent Labs  Lab 10/18/22 1944 10/19/22 0419 10/20/22 0712  WBC 8.9 8.3 6.2   Microbiology No results found for this or any previous visit (from the past 240 hour(s)).   Time coordinating discharge: 35 minutes  SIGNED:   Aline August, MD  [facility] 10/20/2022, 11:07 AM
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
## 2022-10-20 NOTE — Evaluation (Signed)
Physical Therapy Evaluation Patient Details Name: Seth [name] MRN: [id number] DOB: 11/13/1955 Today's Date: 10/20/2022  History of Present Illness  Patient is a 67 y/o male who presents on 2/21 for breakthrough seizures in the setting of alcohol use and likely medication noncompliance.  Concern for code stroke in ED due to left weakness and hemineglect but MRI and CT unremarkable. PMH includes CVA with right sided weakness, SDH, ETOH use, seizure disorder, left TM amputation, polysubstance abuse, PVD, COPD.  Clinical Impression  Patient presents with residual right sided weakness from prior CVA, impaired balance and impaired mobility s/p above. Pt was living in a motel and using RW for ambulation PTA. Pt uses a taxi to get around and reports being independent for ADLs. Plans to d/c home with his mother. Today, pt tolerated transfers and gait training with supervision-Min guard for safety. Reports feeling slightly weaker than baseline but functional. Will follow acutely to maximize independence and mobility prior to return home.       Recommendations for follow up therapy are one component of a multi-disciplinary discharge planning process, led by the attending physician.  Recommendations may be updated based on patient status, additional functional criteria and insurance authorization.  Follow Up Recommendations No PT follow up      Assistance Recommended at Discharge Intermittent Supervision/Assistance  Patient can return home with the following  A little help with walking and/or transfers;A little help with bathing/dressing/bathroom;Help with stairs or ramp for entrance;Assist for transportation;Assistance with cooking/housework    Equipment Recommendations None recommended by PT  Recommendations for Other Services       Functional Status Assessment Patient has had a recent decline in their functional status and demonstrates the ability to make significant improvements in function in a  reasonable and predictable amount of time.     Precautions / Restrictions Precautions Precautions: Fall;Other (comment) Precaution Comments: seizures Restrictions Weight Bearing Restrictions: No      Mobility  Bed Mobility               General bed mobility comments: Up in chair upon PT arrival.    Transfers Overall transfer level: Needs assistance Equipment used: Rolling [place] (2 wheels) Transfers: Sit to/from Stand Sit to Stand: Supervision           General transfer comment: Supervision for safety. Stood from chair x2, good demo of hand placement.    Ambulation/Gait Ambulation/Gait assistance: Min guard, Supervision Gait Distance (Feet): 200 Feet Assistive device: Rolling [place] (2 wheels) Gait Pattern/deviations: Step-through pattern, Trunk flexed, Decreased step length - right Gait velocity: decreased Gait velocity interpretation: <1.31 ft/sec, indicative of household ambulator   General Gait Details: Slow, mildly unsteady gait with circumduction of RLE and heavy reliance on UEs for support, reports he does better with shoes on esp for Left foot due to TMA  Stairs            Wheelchair Mobility    Modified Rankin (Stroke Patients Only)       Balance Overall balance assessment: Needs assistance Sitting-balance support: Feet supported, No upper extremity supported Sitting balance-[name] Scale: Good Sitting balance - Comments: Able to reach outside BoS and adjust socks without difficulty   Standing balance support: During functional activity, Bilateral upper extremity supported Standing balance-[name] Scale: Poor Standing balance comment: Requires UE support                             Pertinent Vitals/Pain Pain Assessment  Pain Assessment: Faces Faces Pain Scale: No hurt    Home Living Family/patient expects to be discharged to:: Private residence Living Arrangements: Parent (62 y.o mother) Available Help at Discharge:  Family;Available 24 hours/day Type of Home: House Home Access: Stairs to enter   [employer] of Steps: " a few"   Home Layout: Two level;Able to live on main level with bedroom/bathroom Home Equipment: Rolling [place] (2 wheels);Cane - single point;Wheelchair - manual Additional Comments: plans on staying with his 17 y/o mother at d/c.    Prior Function Prior Level of Function : Independent/Modified Independent             Mobility Comments: Using RW for ambulation. ADLs Comments: Able to perform ADLs. Does not drive, takes a taxi     Hand Dominance   Dominant Hand: Right    Extremity/Trunk Assessment   Upper Extremity Assessment Upper Extremity Assessment: Defer to OT evaluation;RUE deficits/detail RUE Deficits / Details: Weak grip, residual weakness from prior CVA, limited shoulder elevation    Lower Extremity Assessment Lower Extremity Assessment: Generalized weakness;LLE deficits/detail;RLE deficits/detail RLE Deficits / Details: hx of CVA with residual weakness but functional LLE Deficits / Details: left TMA    Cervical / Trunk Assessment Cervical / Trunk Assessment: Normal  Communication   Communication: No difficulties  Cognition Arousal/Alertness: Awake/alert Behavior During Therapy: WFL for tasks assessed/performed, Impulsive Overall Cognitive Status: No family/caregiver present to determine baseline cognitive functioning                                 General Comments: Follows commands well. Asking if chair is locked before he sits. Impulsive.        General Comments General comments (skin integrity, edema, etc.): VSS on RA.    Exercises     Assessment/Plan    PT Assessment Patient needs continued PT services  PT Problem List Decreased strength;Decreased mobility;Decreased balance       PT Treatment Interventions Therapeutic activities;Gait training;Therapeutic exercise;Patient/family education;Balance training;Stair  training;Functional mobility training;Neuromuscular re-education    PT Goals (Current goals can be found in the Care Plan section)  Acute Rehab PT Goals Patient Stated Goal: to go home PT Goal Formulation: With patient Time For Goal Achievement: 11/03/22 Potential to Achieve Goals: Good    Frequency Min 3X/week     Co-evaluation               AM-PAC PT "6 Clicks" Mobility  Outcome Measure Help needed turning from your back to your side while in a flat bed without using bedrails?: None Help needed moving from lying on your back to sitting on the side of a flat bed without using bedrails?: A Little Help needed moving to and from a bed to a chair (including a wheelchair)?: A Little Help needed standing up from a chair using your arms (e.g., wheelchair or bedside chair)?: A Little Help needed to walk in hospital room?: A Little Help needed climbing 3-5 steps with a railing? : A Little 6 Click Score: 19    End of Session Equipment Utilized During Treatment: Gait belt Activity Tolerance: Patient tolerated treatment well Patient left: in chair;with call bell/phone within reach;with chair alarm set Nurse Communication: Mobility status PT Visit Diagnosis: Difficulty in walking, not elsewhere classified (R26.2);Unsteadiness on feet (R26.81)    Time: [id number] PT Time Calculation (min) (ACUTE ONLY): 16 min   Charges:   PT Evaluation $PT Eval Moderate Complexity: 1  Mod          Zettie Cooley, DPT Acute Rehabilitation Services Secure chat preferred Office [phone]     Lacie Draft 10/20/2022, 12:32 PM

## 2022-10-30 ENCOUNTER — Other Ambulatory Visit: Payer: Self-pay

## 2022-10-30 ENCOUNTER — Emergency Department (HOSPITAL_COMMUNITY): Payer: Medicare Other

## 2022-10-30 ENCOUNTER — Emergency Department (HOSPITAL_COMMUNITY)
Admission: EM | Admit: 2022-10-30 | Discharge: 2022-10-30 | Disposition: A | Discharge status: Home / Self Care | Payer: Medicare Other | Attending: Emergency Medicine | Admitting: Emergency Medicine

## 2022-10-30 ENCOUNTER — Encounter (HOSPITAL_COMMUNITY): Payer: Self-pay

## 2022-10-30 DIAGNOSIS — R531 Weakness: Secondary | ICD-10-CM | POA: Diagnosis present

## 2022-10-30 DIAGNOSIS — R0602 Shortness of breath: Secondary | ICD-10-CM | POA: Diagnosis not present

## 2022-10-30 DIAGNOSIS — Z7982 Long term (current) use of aspirin: Secondary | ICD-10-CM | POA: Diagnosis not present

## 2022-10-30 LAB — COMPREHENSIVE METABOLIC PANEL
ALT: 26 U/L (ref 0–44)
AST: 50 U/L — ABNORMAL HIGH (ref 15–41)
Albumin: 3.4 g/dL — ABNORMAL LOW (ref 3.5–5.0)
Alkaline Phosphatase: 173 U/L — ABNORMAL HIGH (ref 38–126)
Anion gap: 8 (ref 5–15)
BUN: 9 mg/dL (ref 8–23)
CO2: 25 mmol/L (ref 22–32)
Calcium: 8.7 mg/dL — ABNORMAL LOW (ref 8.9–10.3)
Chloride: 104 mmol/L (ref 98–111)
Creatinine, Ser: 1.14 mg/dL (ref 0.61–1.24)
GFR, Estimated: >60 mL/min (ref >60)
Glucose, Bld: 92 mg/dL (ref 70–99)
Potassium: 3.4 mmol/L — ABNORMAL LOW (ref 3.5–5.1)
Sodium: 137 mmol/L (ref 135–145)
Total Bilirubin: 0.7 mg/dL (ref 0.3–1.2)
Total Protein: 6.3 g/dL — ABNORMAL LOW (ref 6.5–8.1)

## 2022-10-30 LAB — URINALYSIS, ROUTINE W REFLEX MICROSCOPIC
Bilirubin Urine: NEGATIVE
Glucose, UA: NEGATIVE mg/dL
Hgb urine dipstick: NEGATIVE
Ketones, ur: NEGATIVE mg/dL
Leukocytes,Ua: NEGATIVE
Nitrite: NEGATIVE
Protein, ur: NEGATIVE mg/dL
Specific Gravity, Urine: 1.026 (ref 1.005–1.030)
pH: 5 (ref 5.0–8.0)

## 2022-10-30 LAB — CBC WITH DIFFERENTIAL/PLATELET
Abs Immature Granulocytes: 0.02 10*3/uL (ref 0.00–0.07)
Basophils Absolute: 0 10*3/uL (ref 0.0–0.1)
Basophils Relative: 1 %
Eosinophils Absolute: 0.2 10*3/uL (ref 0.0–0.5)
Eosinophils Relative: 4 %
HCT: 40.4 % (ref 39.0–52.0)
Hemoglobin: 13.5 g/dL (ref 13.0–17.0)
Immature Granulocytes: 0 %
Lymphocytes Relative: 26 %
Lymphs Abs: 1.5 10*3/uL (ref 0.7–4.0)
MCH: 29.7 pg (ref 26.0–34.0)
MCHC: 33.4 g/dL (ref 30.0–36.0)
MCV: 88.8 fL (ref 80.0–100.0)
Monocytes Absolute: 0.7 10*3/uL (ref 0.1–1.0)
Monocytes Relative: 12 %
Neutro Abs: 3.2 10*3/uL (ref 1.7–7.7)
Neutrophils Relative %: 57 %
Platelets: 190 10*3/uL (ref 150–400)
RBC: 4.55 MIL/uL (ref 4.22–5.81)
RDW: 15.3 % (ref 11.5–15.5)
WBC: 5.7 10*3/uL (ref 4.0–10.5)
nRBC: 0.4 % — ABNORMAL HIGH (ref 0.0–0.2)

## 2022-10-30 MED ORDER — POTASSIUM CHLORIDE 20 MEQ PO PACK
40.0000 meq | PACK | Freq: Every day | ORAL | Status: DC
  Administered 2022-10-30: 40 meq via ORAL
  Filled 2022-10-30: qty 2

## 2022-10-30 NOTE — ED Provider Notes (Signed)
[facility] Provider Note   CSN: [id number] Arrival date & time: 10/30/22  0940     History  Chief Complaint  Patient presents with   Weakness    BIBA for general weakness and feeling tired since yesterday.  Denies pain.  H/o cva with residual     Seth [name] is a 67 y.o. male.  HPI 67 year old male history of stroke, seizure disorder, on Keppra and Vimpat and phenobarbital, alcohol use disorder, recently admitted to the hospital 2/21 to 10/20/2022 with breakthrough seizures.  Brought in today for generalized weakness.  He denies any pain, fever, chills.  He endorses the ongoing residual right upper extremity weakness from prior stroke.  He does not think that he has had any new seizures.     Home Medications Prior to Admission medications   Medication Sig Start Date End Date Taking? Authorizing Provider  acetaminophen (TYLENOL) 325 MG tablet Take 2 tablets (650 mg total) by mouth every 6 (six) hours as needed for headache or mild pain. 10/18/21   Mercy Riding, MD  clopidogrel (PLAVIX) 75 MG tablet Take 1 tablet (75 mg total) by mouth daily. 05/24/21   Thurnell Lose, MD  cyanocobalamin (VITAMIN B12) 1000 MCG tablet Take 1,000 mcg by mouth every other day. 10/09/22   [provider]  EQ ASPIRIN ADULT LOW DOSE 81 MG tablet Take 81 mg by mouth daily. 10/09/22   [provider]  feeding supplement (ENSURE ENLIVE / ENSURE PLUS) LIQD Take 237 mLs by mouth 3 (three) times daily between meals. 07/30/20   Ghimire, Henreitta Leber, MD  FEROSUL 325 (65 Fe) MG tablet Take 325 mg by mouth daily. 10/09/22   [provider]  fluticasone-salmeterol (ADVAIR) 500-50 MCG/ACT AEPB Inhale 1 puff into the lungs 2 (two) times daily. 04/13/21   [provider]  folic acid (FOLVITE) 1 MG tablet Take 1 mg by mouth daily. 10/09/22   [provider]  gabapentin (NEURONTIN) 300 MG capsule Take 300 mg by mouth 3 (three) times  daily. 07/07/21   [provider]  INCRUSE ELLIPTA 62.5 MCG/INH AEPB INHALE 1 PUFF INTO THE LUNGS DAILY Patient taking differently: Inhale 1 puff into the lungs daily. 04/08/19   Mack Hook, MD  Lacosamide 100 MG TABS Take 1 tablet (100 mg total) by mouth 2 (two) times daily. 05/27/22 10/03/23  Erskine Emery, MD  levETIRAcetam (KEPPRA) 750 MG tablet Take 2 tablets (1,500 mg total) by mouth 2 (two) times daily. 03/20/21   Kathie Dike, MD  metoprolol tartrate (LOPRESSOR) 50 MG tablet Take 1 tablet (50 mg total) by mouth 2 (two) times daily. 03/20/21   Kathie Dike, MD  Multiple Vitamins-Minerals (DAILY VITAMIN FORMULA+MINERALS) TABS Take 1 tablet by mouth daily. 10/09/22   [provider]  omeprazole (PRILOSEC) 40 MG capsule Take 40 mg by mouth daily. 10/09/22   [provider]  PHENobarbital (LUMINAL) 32.4 MG tablet Take 1 tablet (32.4 mg total) by mouth in the morning. 05/27/22   Erskine Emery, MD  PHENobarbital (LUMINAL) 64.8 MG tablet Take 1 tablet (64.8 mg total) by mouth at bedtime. Patient not taking: Reported on 10/02/2022 05/27/22   Erskine Emery, MD  rosuvastatin (CRESTOR) 10 MG tablet Take 1 tablet (10 mg total) by mouth daily. 09/28/20   Charlynne Cousins, MD  sertraline (ZOLOFT) 25 MG tablet Take 25 mg by mouth daily.    [provider]  thiamine (VITAMIN B1) 100 MG tablet Take 100  mg by mouth daily. 10/09/22   [provider]  Zinc Sulfate 220 (50 Zn) MG TABS Take 1 tablet by mouth daily. 10/09/22   [provider]      Allergies    Patient has no known allergies.    Review of Systems   Review of Systems  Physical Exam Updated Vital Signs BP (!) 159/89   Pulse 98   Temp 98.1 F (36.7 C)   Resp 15   Ht 1.905 m ('6\' 3"'$ )   Wt 73.7 kg   SpO2 100%   BMI 20.31 kg/m  Physical Exam Vitals and nursing note reviewed.  Constitutional:      Appearance: Normal appearance.  HENT:     Head: Normocephalic.     Right Ear:  External ear normal.     Left Ear: External ear normal.     Nose: Nose normal.     Mouth/Throat:     Pharynx: Oropharynx is clear.  Eyes:     Extraocular Movements: Extraocular movements intact.     Pupils: Pupils are equal, round, and reactive to light.  Cardiovascular:     Rate and Rhythm: Normal rate and regular rhythm.     Pulses: Normal pulses.  Pulmonary:     Effort: Pulmonary effort is normal.  Abdominal:     General: Bowel sounds are normal.     Palpations: Abdomen is soft.  Musculoskeletal:        General: Normal range of motion.     Cervical back: Normal range of motion.  Skin:    General: Skin is warm.     Capillary Refill: Capillary refill takes less than 2 seconds.  Neurological:     General: No focal deficit present.     Mental Status: He is alert.  Psychiatric:        Mood and Affect: Mood normal.     ED Results / Procedures / Treatments   Labs (all labs ordered are listed, but only abnormal results are displayed) Labs Reviewed  CBC WITH DIFFERENTIAL/PLATELET - Abnormal; Notable for the following components:      Result Value   nRBC 0.4 (*)    All other components within normal limits  COMPREHENSIVE METABOLIC PANEL - Abnormal; Notable for the following components:   Potassium 3.4 (*)    Calcium 8.7 (*)    Total Protein 6.3 (*)    Albumin 3.4 (*)    AST 50 (*)    Alkaline Phosphatase 173 (*)    All other components within normal limits  URINALYSIS, ROUTINE W REFLEX MICROSCOPIC    EKG EKG Interpretation  Date/Time:  Monday October 30 2022 16:02:00 EST Ventricular Rate:  64 PR Interval:  192 QRS Duration: 70 QT Interval:  376 QTC Calculation: 387 R Axis:   41 Text Interpretation: Normal sinus rhythm Normal ECG Since last tracing rate slower Otherwise no significant change Confirmed by Deno Etienne [phone]) on 10/30/2022 4:09:07 PM  Radiology CT Head Wo Contrast  Result Date: 10/30/2022 CLINICAL DATA:  Altered mental status.  Dizziness and headache.  EXAM: CT HEAD WITHOUT CONTRAST TECHNIQUE: Contiguous axial images were obtained from the base of the skull through the vertex without intravenous contrast. RADIATION DOSE REDUCTION: This exam was performed according to the departmental dose-optimization program which includes automated exposure control, adjustment of the mA and/or kV according to patient size and/or use of iterative reconstruction technique. COMPARISON:  MRI brain and CT head dated October 18, 2022. FINDINGS: Brain: No evidence of acute infarction,  hemorrhage, hydrocephalus, extra-axial collection or mass lesion/mass effect. Chronic lacunar infarcts in both cerebellar hemispheres again noted. Stable atrophy and chronic microvascular ischemic changes. Unchanged 9 mm round high density lesion along the right anterior falx (series 3, image 20), consistent with small meningioma. Vascular: Atherosclerotic vascular calcification of the carotid siphons. No hyperdense vessel. Skull: Normal. Negative for fracture or focal lesion. Sinuses/Orbits: No acute finding. Other: None. IMPRESSION: 1. No acute intracranial abnormality. Electronically Signed   By: Titus Dubin M.D.   On: 10/30/2022 12:10   DG Chest Port 1 View  Result Date: 10/30/2022 CLINICAL DATA:  Cough, shortness of breath, weakness EXAM: PORTABLE CHEST 1 VIEW COMPARISON:  10/11/2022 FINDINGS: Cardiac size is within normal limits. Lung fields are clear of any infiltrates or pulmonary edema. Blunting of right lateral CP angle may suggest pleural thickening. There is no pneumothorax. IMPRESSION: No active disease. Electronically Signed   By: Elmer Picker M.D.   On: 10/30/2022 11:01    Procedures Procedures    Medications Ordered in ED Medications  potassium chloride (KLOR-CON) packet 40 mEq (40 mEq Oral Given 10/30/22 1529)    ED Course/ Medical Decision Making/ A&P Clinical Course as of 10/30/22 1645  Mon Oct 30, 2022  1447 CT head reviewed interpreted no evidence of acute  abnormality noted and radiologist interpretation concurs [DR]  1447 Chest x-Josiane Labine reviewed interpreted and within normal limits [DR]  1447 CBC reviewed interpreted within normal limits [DR]  [id number] Complete metabolic panel is reviewed interpreted significant for mild hypokalemia BUN and creatinine normal Mild hypocalcemia at 8.7  [DR]  1448 Albumin and protein are low consistent with poor nutritional status Alk phos is elevated with normal ALT AST elevated at 50 and bilirubin is normal [DR]  1534 Urinalysis reviewed and interpreted and no evidence of infection or other acute abnormalities noted [DR]    Clinical Course User Index [DR] Pattricia Boss, MD                             Medical Decision Making Amount and/or Complexity of Data Reviewed Labs: ordered. Radiology: ordered.  Risk Prescription drug management.   67 year old man history of stroke history of seizures presents today with some generalized weakness.  He has a baseline neurological exam.  He denies any lateralized weakness.  He has not had fever or chills.  He was evaluated here with labs and imaging.  He had mild hypokalemia which is repleted.  Analysis obtained does not show evidence acute abnormality. EKG is normal. Head CT and chest x-Jaspal Pultz without acute abnormalities Patient appears stable for discharge.  He is advised of findings, return precautions, need for follow-up        Final Clinical Impression(s) / ED Diagnoses Final diagnoses:  Weakness    Rx / DC Orders ED Discharge Orders     None         Pattricia Boss, MD 10/30/22 1645

## 2022-10-30 NOTE — ED Notes (Signed)
Returned from CT.

## 2022-10-30 NOTE — Discharge Instructions (Signed)
You were evaluated in the emergency department today for generalized weakness.  Evaluated with labs, EKG and urinalysis.  No definite source was found.  You do have ongoing elevated liver enzymes please have these rechecked with your doctor.  You had mildly decreased potassium.  You were given some oral potassium for repletion. A head CT was obtained that showed no evidence of acute bleeding or other abnormality in your brain. Please follow-up with your doctor this week and have your potassium rechecked. Return to the emergency department if you are having any worsening symptoms.

## 2022-10-30 NOTE — ED Notes (Signed)
To ct via stretcher

## 2022-11-29 ENCOUNTER — Emergency Department (HOSPITAL_COMMUNITY): Payer: Medicare Other

## 2022-11-29 ENCOUNTER — Other Ambulatory Visit: Payer: Self-pay

## 2022-11-29 ENCOUNTER — Ambulatory Visit: Payer: Medicare Other | Admitting: Podiatry

## 2022-11-29 ENCOUNTER — Inpatient Hospital Stay (HOSPITAL_COMMUNITY)
Admission: EM | Admit: 2022-11-29 | Discharge: 2022-12-01 | DRG: 101 | Disposition: A | Discharge status: Skilled Nursing Facility | Payer: Medicare Other | Attending: Internal Medicine | Admitting: Internal Medicine

## 2022-11-29 ENCOUNTER — Encounter (HOSPITAL_COMMUNITY): Payer: Self-pay | Admitting: Internal Medicine

## 2022-11-29 DIAGNOSIS — F32A Depression, unspecified: Secondary | ICD-10-CM | POA: Diagnosis present

## 2022-11-29 DIAGNOSIS — G40101 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, not intractable, with status epilepticus: Secondary | ICD-10-CM | POA: Diagnosis not present

## 2022-11-29 DIAGNOSIS — G40901 Epilepsy, unspecified, not intractable, with status epilepticus: Secondary | ICD-10-CM | POA: Diagnosis not present

## 2022-11-29 DIAGNOSIS — F419 Anxiety disorder, unspecified: Secondary | ICD-10-CM | POA: Diagnosis present

## 2022-11-29 DIAGNOSIS — Z7982 Long term (current) use of aspirin: Secondary | ICD-10-CM

## 2022-11-29 DIAGNOSIS — F17210 Nicotine dependence, cigarettes, uncomplicated: Secondary | ICD-10-CM | POA: Diagnosis present

## 2022-11-29 DIAGNOSIS — E785 Hyperlipidemia, unspecified: Secondary | ICD-10-CM | POA: Diagnosis present

## 2022-11-29 DIAGNOSIS — Z8249 Family history of ischemic heart disease and other diseases of the circulatory system: Secondary | ICD-10-CM

## 2022-11-29 DIAGNOSIS — F1020 Alcohol dependence, uncomplicated: Secondary | ICD-10-CM | POA: Diagnosis present

## 2022-11-29 DIAGNOSIS — J449 Chronic obstructive pulmonary disease, unspecified: Secondary | ICD-10-CM | POA: Diagnosis present

## 2022-11-29 DIAGNOSIS — Z7951 Long term (current) use of inhaled steroids: Secondary | ICD-10-CM

## 2022-11-29 DIAGNOSIS — G40909 Epilepsy, unspecified, not intractable, without status epilepticus: Principal | ICD-10-CM

## 2022-11-29 DIAGNOSIS — N400 Enlarged prostate without lower urinary tract symptoms: Secondary | ICD-10-CM | POA: Diagnosis present

## 2022-11-29 DIAGNOSIS — Z7409 Other reduced mobility: Secondary | ICD-10-CM | POA: Diagnosis present

## 2022-11-29 DIAGNOSIS — Z7902 Long term (current) use of antithrombotics/antiplatelets: Secondary | ICD-10-CM

## 2022-11-29 DIAGNOSIS — R569 Unspecified convulsions: Secondary | ICD-10-CM | POA: Diagnosis not present

## 2022-11-29 DIAGNOSIS — I69341 Monoplegia of lower limb following cerebral infarction affecting right dominant side: Secondary | ICD-10-CM

## 2022-11-29 DIAGNOSIS — F1090 Alcohol use, unspecified, uncomplicated: Secondary | ICD-10-CM | POA: Diagnosis present

## 2022-11-29 DIAGNOSIS — I10 Essential (primary) hypertension: Secondary | ICD-10-CM | POA: Diagnosis present

## 2022-11-29 DIAGNOSIS — E11649 Type 2 diabetes mellitus with hypoglycemia without coma: Secondary | ICD-10-CM | POA: Diagnosis present

## 2022-11-29 DIAGNOSIS — Z79899 Other long term (current) drug therapy: Secondary | ICD-10-CM

## 2022-11-29 DIAGNOSIS — Z89432 Acquired absence of left foot: Secondary | ICD-10-CM

## 2022-11-29 DIAGNOSIS — M109 Gout, unspecified: Secondary | ICD-10-CM | POA: Diagnosis present

## 2022-11-29 LAB — COMPREHENSIVE METABOLIC PANEL
ALT: 14 U/L (ref 0–44)
AST: 25 U/L (ref 15–41)
Albumin: 2.9 g/dL — ABNORMAL LOW (ref 3.5–5.0)
Alkaline Phosphatase: 163 U/L — ABNORMAL HIGH (ref 38–126)
Anion gap: 11 (ref 5–15)
BUN: 8 mg/dL (ref 8–23)
CO2: 22 mmol/L (ref 22–32)
Calcium: 8.4 mg/dL — ABNORMAL LOW (ref 8.9–10.3)
Chloride: 105 mmol/L (ref 98–111)
Creatinine, Ser: 0.87 mg/dL (ref 0.61–1.24)
GFR, Estimated: >60 mL/min (ref >60)
Glucose, Bld: 73 mg/dL (ref 70–99)
Potassium: 3.6 mmol/L (ref 3.5–5.1)
Sodium: 138 mmol/L (ref 135–145)
Total Bilirubin: 0.7 mg/dL (ref 0.3–1.2)
Total Protein: 6.1 g/dL — ABNORMAL LOW (ref 6.5–8.1)

## 2022-11-29 LAB — CBC
HCT: 34.8 % — ABNORMAL LOW (ref 39.0–52.0)
Hemoglobin: 11.5 g/dL — ABNORMAL LOW (ref 13.0–17.0)
MCH: 30 pg (ref 26.0–34.0)
MCHC: 33 g/dL (ref 30.0–36.0)
MCV: 90.9 fL (ref 80.0–100.0)
Platelets: 247 10*3/uL (ref 150–400)
RBC: 3.83 MIL/uL — ABNORMAL LOW (ref 4.22–5.81)
RDW: 17.1 % — ABNORMAL HIGH (ref 11.5–15.5)
WBC: 6 10*3/uL (ref 4.0–10.5)
nRBC: 0.3 % — ABNORMAL HIGH (ref 0.0–0.2)

## 2022-11-29 LAB — ETHANOL: Alcohol, Ethyl (B): <10 mg/dL (ref <10)

## 2022-11-29 LAB — I-STAT CHEM 8, ED
BUN: 9 mg/dL (ref 8–23)
Calcium, Ion: 1.05 mmol/L — ABNORMAL LOW (ref 1.15–1.40)
Chloride: 105 mmol/L (ref 98–111)
Creatinine, Ser: 0.7 mg/dL (ref 0.61–1.24)
Glucose, Bld: 69 mg/dL — ABNORMAL LOW (ref 70–99)
HCT: 37 % — ABNORMAL LOW (ref 39.0–52.0)
Hemoglobin: 12.6 g/dL — ABNORMAL LOW (ref 13.0–17.0)
Potassium: 3.7 mmol/L (ref 3.5–5.1)
Sodium: 139 mmol/L (ref 135–145)
TCO2: 24 mmol/L (ref 22–32)

## 2022-11-29 LAB — PHENOBARBITAL LEVEL: Phenobarbital: 24.2 ug/mL (ref 15.0–40.0)

## 2022-11-29 MED ORDER — PHENOBARBITAL 64.8 MG PO TABS: 64.8000 mg | ORAL_TABLET | Freq: Every day | ORAL | Status: DC

## 2022-11-29 MED ORDER — MOMETASONE FURO-FORMOTEROL FUM 200-5 MCG/ACT IN AERO
2.0000 | INHALATION_SPRAY | Freq: Two times a day (BID) | RESPIRATORY_TRACT | Status: DC
  Administered 2022-11-29 – 2022-12-01 (×4): 2 via RESPIRATORY_TRACT
  Filled 2022-11-29: qty 8.8

## 2022-11-29 MED ORDER — ASPIRIN 81 MG PO TBEC
81.0000 mg | DELAYED_RELEASE_TABLET | Freq: Every day | ORAL | Status: DC
  Administered 2022-11-29 – 2022-12-01 (×3): 81 mg via ORAL
  Filled 2022-11-29 (×3): qty 1

## 2022-11-29 MED ORDER — METOPROLOL TARTRATE 50 MG PO TABS
50.0000 mg | ORAL_TABLET | Freq: Two times a day (BID) | ORAL | Status: DC
  Administered 2022-11-29 – 2022-12-01 (×5): 50 mg via ORAL
  Filled 2022-11-29 (×3): qty 1
  Filled 2022-11-29: qty 2
  Filled 2022-11-29: qty 1

## 2022-11-29 MED ORDER — FOLIC ACID 1 MG PO TABS
1.0000 mg | ORAL_TABLET | Freq: Every day | ORAL | Status: DC
  Administered 2022-11-29 – 2022-12-01 (×3): 1 mg via ORAL
  Filled 2022-11-29 (×3): qty 1

## 2022-11-29 MED ORDER — FOLIC ACID 1 MG PO TABS: 1.0000 mg | ORAL_TABLET | Freq: Every day | ORAL | Status: DC

## 2022-11-29 MED ORDER — PHENOBARBITAL 32.4 MG PO TABS
64.8000 mg | ORAL_TABLET | Freq: Two times a day (BID) | ORAL | Status: DC
  Administered 2022-11-29 – 2022-12-01 (×4): 64.8 mg via ORAL
  Filled 2022-11-29 (×5): qty 2

## 2022-11-29 MED ORDER — ENOXAPARIN SODIUM 40 MG/0.4ML IJ SOSY
40.0000 mg | PREFILLED_SYRINGE | INTRAMUSCULAR | Status: DC
  Administered 2022-11-29 – 2022-12-01 (×3): 40 mg via SUBCUTANEOUS
  Filled 2022-11-29 (×3): qty 0.4

## 2022-11-29 MED ORDER — LACOSAMIDE 50 MG PO TABS
150.0000 mg | ORAL_TABLET | Freq: Two times a day (BID) | ORAL | Status: DC
  Administered 2022-11-29 – 2022-12-01 (×4): 150 mg via ORAL
  Filled 2022-11-29 (×4): qty 3

## 2022-11-29 MED ORDER — SODIUM CHLORIDE 0.9 % IV SOLN: 2000.0000 mg | Freq: Once | INTRAVENOUS | Status: DC

## 2022-11-29 MED ORDER — ENSURE ENLIVE PO LIQD
237.0000 mL | Freq: Three times a day (TID) | ORAL | Status: DC
  Administered 2022-11-29 – 2022-12-01 (×7): 237 mL via ORAL
  Filled 2022-11-29: qty 237

## 2022-11-29 MED ORDER — CLONAZEPAM 0.5 MG PO TABS
0.5000 mg | ORAL_TABLET | Freq: Two times a day (BID) | ORAL | Status: DC
  Administered 2022-11-29 – 2022-11-30 (×2): 0.5 mg via ORAL
  Filled 2022-11-29 (×2): qty 1

## 2022-11-29 MED ORDER — ORAL CARE MOUTH RINSE: 15.0000 mL | OROMUCOSAL | Status: DC | PRN

## 2022-11-29 MED ORDER — SODIUM CHLORIDE 0.9 % IV SOLN
10.0000 mg/kg | INTRAVENOUS | Status: AC
  Administered 2022-11-29: 737 mg via INTRAVENOUS
  Filled 2022-11-29: qty 14.74

## 2022-11-29 MED ORDER — THIAMINE MONONITRATE 100 MG PO TABS: 100.0000 mg | ORAL_TABLET | Freq: Every day | ORAL | Status: DC

## 2022-11-29 MED ORDER — ADULT MULTIVITAMIN W/MINERALS CH
1.0000 | ORAL_TABLET | Freq: Every day | ORAL | Status: DC
  Administered 2022-11-29: 1 via ORAL
  Filled 2022-11-29: qty 1

## 2022-11-29 MED ORDER — PANTOPRAZOLE SODIUM 40 MG PO TBEC
40.0000 mg | DELAYED_RELEASE_TABLET | Freq: Every day | ORAL | Status: DC
  Administered 2022-11-29 – 2022-12-01 (×3): 40 mg via ORAL
  Filled 2022-11-29 (×3): qty 1

## 2022-11-29 MED ORDER — PHENOBARBITAL 32.4 MG PO TABS
32.4000 mg | ORAL_TABLET | Freq: Every morning | ORAL | Status: DC
  Administered 2022-11-29: 32.4 mg via ORAL
  Filled 2022-11-29: qty 1

## 2022-11-29 MED ORDER — SODIUM CHLORIDE 0.9 % IV SOLN
100.0000 mg | Freq: Two times a day (BID) | INTRAVENOUS | Status: DC
  Administered 2022-11-29: 100 mg via INTRAVENOUS
  Filled 2022-11-29 (×2): qty 10

## 2022-11-29 MED ORDER — CLOPIDOGREL BISULFATE 75 MG PO TABS: 75.0000 mg | ORAL_TABLET | Freq: Every day | ORAL | Status: DC

## 2022-11-29 MED ORDER — GABAPENTIN 300 MG PO CAPS
300.0000 mg | ORAL_CAPSULE | Freq: Three times a day (TID) | ORAL | Status: DC
  Administered 2022-11-29 – 2022-12-01 (×7): 300 mg via ORAL
  Filled 2022-11-29 (×7): qty 1

## 2022-11-29 MED ORDER — SODIUM CHLORIDE 0.9 % IV SOLN
75.0000 mL/h | INTRAVENOUS | Status: DC
  Administered 2022-11-29: 75 mL/h via INTRAVENOUS

## 2022-11-29 MED ORDER — LEVETIRACETAM IN NACL 1500 MG/100ML IV SOLN
1500.0000 mg | Freq: Two times a day (BID) | INTRAVENOUS | Status: DC
  Filled 2022-11-29: qty 100

## 2022-11-29 MED ORDER — LEVETIRACETAM IN NACL 1000 MG/100ML IV SOLN
1000.0000 mg | INTRAVENOUS | Status: AC
  Administered 2022-11-29 (×2): 1000 mg via INTRAVENOUS
  Filled 2022-11-29 (×2): qty 100

## 2022-11-29 MED ORDER — CLOPIDOGREL BISULFATE 75 MG PO TABS
75.0000 mg | ORAL_TABLET | Freq: Every day | ORAL | Status: DC
  Administered 2022-11-29 – 2022-12-01 (×3): 75 mg via ORAL
  Filled 2022-11-29 (×3): qty 1

## 2022-11-29 MED ORDER — SERTRALINE HCL 50 MG PO TABS
25.0000 mg | ORAL_TABLET | Freq: Every day | ORAL | Status: DC
  Administered 2022-11-29 – 2022-12-01 (×3): 25 mg via ORAL
  Filled 2022-11-29 (×3): qty 1

## 2022-11-29 MED ORDER — LORAZEPAM 2 MG/ML IJ SOLN: 1.0000 mg | INTRAMUSCULAR | Status: DC | PRN

## 2022-11-29 MED ORDER — ORAL CARE MOUTH RINSE
15.0000 mL | OROMUCOSAL | Status: DC
  Administered 2022-11-29 – 2022-11-30 (×9): 15 mL via OROMUCOSAL

## 2022-11-29 MED ORDER — MIDAZOLAM HCL 2 MG/2ML IJ SOLN: 2.0000 mg | INTRAMUSCULAR | Status: DC | PRN

## 2022-11-29 MED ORDER — LEVETIRACETAM 750 MG PO TABS
1500.0000 mg | ORAL_TABLET | Freq: Two times a day (BID) | ORAL | Status: DC
  Administered 2022-11-29 – 2022-12-01 (×5): 1500 mg via ORAL
  Filled 2022-11-29 (×4): qty 2
  Filled 2022-11-29: qty 3

## 2022-11-29 MED ORDER — THIAMINE HCL 100 MG/ML IJ SOLN: 100.0000 mg | Freq: Every day | INTRAMUSCULAR | Status: DC

## 2022-11-29 MED ORDER — VITAMIN B-12 1000 MCG PO TABS
1000.0000 ug | ORAL_TABLET | ORAL | Status: DC
  Administered 2022-11-29 – 2022-12-01 (×2): 1000 ug via ORAL
  Filled 2022-11-29 (×2): qty 1

## 2022-11-29 MED ORDER — THIAMINE HCL 100 MG/ML IJ SOLN
100.0000 mg | Freq: Every day | INTRAMUSCULAR | Status: DC
  Filled 2022-11-29: qty 2

## 2022-11-29 MED ORDER — ADULT MULTIVITAMIN W/MINERALS CH
1.0000 | ORAL_TABLET | Freq: Every day | ORAL | Status: DC
  Administered 2022-11-30 – 2022-12-01 (×2): 1 via ORAL
  Filled 2022-11-29 (×2): qty 1

## 2022-11-29 MED ORDER — UMECLIDINIUM BROMIDE 62.5 MCG/ACT IN AEPB
1.0000 | INHALATION_SPRAY | Freq: Every day | RESPIRATORY_TRACT | Status: DC
  Administered 2022-11-30 – 2022-12-01 (×2): 1 via RESPIRATORY_TRACT
  Filled 2022-11-29: qty 7

## 2022-11-29 MED ORDER — ROSUVASTATIN CALCIUM 5 MG PO TABS
10.0000 mg | ORAL_TABLET | Freq: Every day | ORAL | Status: DC
  Administered 2022-11-29 – 2022-12-01 (×3): 10 mg via ORAL
  Filled 2022-11-29 (×3): qty 2

## 2022-11-29 MED ORDER — LEVETIRACETAM IN NACL 1000 MG/100ML IV SOLN
1000.0000 mg | Freq: Once | INTRAVENOUS | Status: AC
  Administered 2022-11-29: 1000 mg via INTRAVENOUS
  Filled 2022-11-29: qty 100

## 2022-11-29 MED ORDER — THIAMINE MONONITRATE 100 MG PO TABS
100.0000 mg | ORAL_TABLET | Freq: Every day | ORAL | Status: DC
  Administered 2022-11-29 – 2022-12-01 (×3): 100 mg via ORAL
  Filled 2022-11-29 (×3): qty 1

## 2022-11-29 MED ORDER — ACETAMINOPHEN 325 MG PO TABS
650.0000 mg | ORAL_TABLET | Freq: Four times a day (QID) | ORAL | Status: DC | PRN
  Administered 2022-12-01: 650 mg via ORAL
  Filled 2022-11-29: qty 2

## 2022-11-29 MED ORDER — LORAZEPAM 1 MG PO TABS: 1.0000 mg | ORAL_TABLET | ORAL | Status: DC | PRN

## 2022-11-29 MED ORDER — LORAZEPAM 2 MG/ML IJ SOLN
2.0000 mg | Freq: Once | INTRAMUSCULAR | Status: AC
  Administered 2022-11-29: 2 mg via INTRAVENOUS
  Filled 2022-11-29: qty 1

## 2022-11-29 NOTE — ED Provider Notes (Signed)
Patient given in sign out by Pati Gallo, PA-C.  Please review their note for patient HPI, physical exam, workup.  At this time the plan is to touch base with neurology after the MRI and to monitor the patient in the meantime.  Neurology came and stated patient is still having twitching and signs of status epilepticus on EEG.  Patient will be given phenytoin and admitted to medicine.  Patient stable at this time for admission.  Consults: Hortense Ramal, MD Neurology Shelly Coss, MD Hospitalist  .Critical Care  Performed by: Elvina Sidle Authorized by: Chuck Hint, PA-C   Critical care provider statement:    Critical care time (minutes):  30   Critical care time was exclusive of:  Separately billable procedures and treating other patients   Critical care was necessary to treat or prevent imminent or life-threatening deterioration of the following conditions:  CNS failure or compromise   Critical care was time spent personally by me on the following activities:  Development of treatment plan with patient or surrogate, discussions with consultants, evaluation of patient's response to treatment, examination of patient, obtaining history from patient or surrogate, review of old charts, re-evaluation of patient's condition, pulse oximetry, ordering and review of radiographic studies, ordering and review of laboratory studies and ordering and performing treatments and interventions   I assumed direction of critical care for this patient from another provider in my specialty: yes     Care discussed with: admitting provider       Chuck Hint, PA-C 11/29/22 1413    Orpah Greek, MD 11/30/22 2317

## 2022-11-29 NOTE — Progress Notes (Signed)
DC LTM EEG for MRI will need rehook after MRI.

## 2022-11-29 NOTE — Progress Notes (Addendum)
Subjective: Continues to have right thumb and index finger rhythmic twitching . States he takes his anti seizure meds but can't tell me the names  ROS: negative except above  Examination  Vital signs in last 24 hours: Temp:  [97.7 F (36.5 C)] 97.7 F (36.5 C) (04/03 0930) Pulse Rate:  [72-87] 73 (04/03 1241) Resp:  [12-23] 20 (04/03 1241) BP: (148-171)/(82-98) 162/97 (04/03 1241) SpO2:  [94 %-100 %] 99 % (04/03 1241) Weight:  [73.7 kg] 73.7 kg (04/03 0256)  General: lying in bed, NAD Neuro: MS: Alert, oriented, follows commands CN: pupils equal and reactive,  EOMI, face symmetric, tongue midline, normal sensation over face Motor: right thumb and index finger rhythmic twitching consistent with focal motor status, 5/5 in LUE/LLE, right lower extremity antigravity strength antigravity   Basic Metabolic Panel: Recent Labs  Lab 11/29/22 0315 11/29/22 0322  NA 138 139  K 3.6 3.7  CL 105 105  CO2 22  --   GLUCOSE 73 69*  BUN 8 9  CREATININE 0.87 0.70  CALCIUM 8.4*  --     CBC: Recent Labs  Lab 11/29/22 0315 11/29/22 0322  WBC 6.0  --   HGB 11.5* 12.6*  HCT 34.8* 37.0*  MCV 90.9  --   PLT 247  --     Coagulation Studies: No results for input(s): "LABPROT", "INR" in the last 72 hours.  Imaging MR Brain wo contrast 11/29/2022:   1. Stable appearance of the brain with no acute intracranial pathology. 2. Unchanged age accelerated parenchymal volume loss and chronic small-vessel ischemic change. 3. Layering fluid in the left maxillary sinus which can be seen with acute sinusitis in the correct clinical setting.  ASSESSMENT AND PLAN: 67yo M with focal convulsive status epilepticus.  Focal convulsive status epilepticus - etiology: could be due to medication non compliance ( levels not checked), also has h/o alcohol use  Recommendations - Load with IV fosphenytoin stat - Increase vimpat to 150mg  BID, Phenobarb to 65mg  BID - Continue keppra 1500mg  BID - As seizures  are focal and nmt affecting awareness currently, will slowly escalate anti-seizure meds ( ASMs) to minimize sedation.  - Next agent can be Clonazepam - CIWA protocol - PRN IV versed for clinical seizures - Seizure precautions - Discussed plan with Dr Doren Custard ( hispitalist) and ED PA  CRITICAL CARE Performed by: Lora Havens   Total critical care time: 32 minutes  Critical care time was exclusive of separately billable procedures and treating other patients.  This patient is critically ill due to focal convulsive status epilepticus and at significant risk of neurological worsening, death.   Critical care was time spent personally by me on the following activities: development of treatment plan with patient and/or surrogate as well as nursing, discussions with consultants, evaluation of patient's response to treatment, examination of patient, obtaining history from patient or surrogate, ordering and performing treatments and interventions, ordering and review of laboratory studies, ordering and review of radiographic studies, pulse oximetry and re-evaluation of patient's condition.   Zeb Comfort Epilepsy [facility] For questions after 5pm please refer to AMION to reach the Neurologist on call

## 2022-11-29 NOTE — Progress Notes (Signed)
LTM EEG hooked up and running - no initial skin breakdown - push button tested - No [facility] at this time. Patient is in the ED. 

## 2022-11-29 NOTE — ED Notes (Addendum)
Pt pressed call light stating he needed to have bowel movement. pt assisted to bedside commode.  Requested privacy. Pt given call light and educated to press when finished and to not stand up. Pt agrees
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
## 2022-11-29 NOTE — ED Triage Notes (Signed)
BIB EMS from home after a fall at home. Patient stood up and right side felt weak and fell. Hx of stroke and focal seizures to right side. LKN 10pm
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
## 2022-11-29 NOTE — Procedures (Incomplete)
Patient Name: Seth [name]  MRN: [id number]  Epilepsy Attending: Lora Havens  Referring Physician/Provider: Kerney Elbe, MD  Duration: 11/29/2022 0515 to 11/30/2022 1115   Patient history: 67 year old male presenting with recurrent focal status epilepticus. EEG to evaluate for seizure   Level of alertness: awake, asleep   AEDs during EEG study: LEV, LCM, Phenobarb, Clonazepam   Technical aspects: This EEG study was done with scalp electrodes positioned according to the 10-20 International system of electrode placement. Electrical activity was reviewed with band pass filter of 1-70Hz , sensitivity of 7 uV/mm, display speed of 60mm/sec with a 60Hz  notched filter applied as appropriate. EEG data were recorded continuously and digitally stored.  Video monitoring was available and reviewed as appropriate.   Description: The posterior dominant rhythm consists of 8-9 Hz activity of moderate voltage (25-35 uV) seen predominantly in posterior head regions, symmetric and reactive to eye opening and eye closing. Sleep was characterized by sleep spindles (12-14hz ),maximal fronto-central region.   Lateralized periodic discharges were noted in left hemisphere with evolution in morphology and frequency. Clinically, patient was less responsive, occasionally starting off. Per chart review at times there is right upper extremity twitching as well. This EEG pattern is consistent with electrographic status epilepticus. IV ativan and keppra was administered. Subsequently status epilepticus resolved at around 0619 on 11/29/2022. EEG then showed continuous 3 to 6 Hz theta-delta slowing in left hemisphere. EEG was disconnected between 11/29/2022 0928 to 1331 for MRI brain. When EEG was reconnected, Lateralized periodic discharges ( LPDs) were again noted in left hemisphere at 1.5Hz . Anti-seizure medications were adjusted. After around 2200 on 11/29/2022, frequency of LPDs improved to 1Hz .   Hyperventilation and photic  stimulation were not performed.      ABNORMALITY - Focal status epilepticus, left hemisphere - Lateralized periodic discharges,  left hemisphere ( LPD) - Continuous slow, left hemisphere   IMPRESSION: This study initially showed convulsive status epilepticus arising from left hemisphere.  IV ativan and keppra was administered. Subsequently status epilepticus resolved at around 0619 on 11/29/2022. EEG was disconnected between 11/29/2022 0928 to 1331 for MRI brain. When study was reconnected, EEG again showed evidence of epileptogenicity arising from left hemisphere with increased risk of seizure recurrence as well as cortical dysfunction in left hemisphere likely due to  underlying structural abnormality.     Seth [name] [name]

## 2022-11-29 NOTE — ED Notes (Signed)
RN spoke with EEG who will come remove pt from overnight EEG monitoring. RN spoke with MRI to update on EEG removal

## 2022-11-29 NOTE — ED Triage Notes (Signed)
5mg  midazolam given enroute.

## 2022-11-29 NOTE — ED Notes (Signed)
RN assisted pt to side of bed to void in urinal. Pt assisted back to bed

## 2022-11-29 NOTE — ED Notes (Signed)
EEG reapplied by EEG tech

## 2022-11-29 NOTE — ED Provider Notes (Signed)
[facility] Provider Note   CSN: [id number] Arrival date & time: 11/29/22  0246     History  Chief Complaint  Patient presents with   Extremity Weakness   Seizures    Seth [name] is a 67 y.o. male.   Extremity Weakness  Seizures  Patient is a 67 year old male with past medical history significant for malnutrition, alcohol, tobacco use, COPD, history of stroke with residual right upper extremity weakness, seizures   Patient presents emergency room brought in by EMS for seizure-like activity. According to Tito Dine which is patient's daughter who he was with last 9 AM patient went to bed around 9 or 10 PM and around midnight walk to the bathroom and was heard laughing some things around the bathroom.  Then approximately 1:30 AM he called down after falling to the ground and was laying on the floor and had right upper extremity shaking consistent with prior seizure-like activity he has had.    Seems that patient is not consistently taking his medications that he is prescribed.  Tito Dine indicates that she has attempted to manage his medications in the past however patient is reluctant to allow other people to monitor his medications.  No CP or any other pain. No NVD.     Home Medications Prior to Admission medications   Medication Sig Start Date End Date Taking? Authorizing Provider  acetaminophen (TYLENOL) 325 MG tablet Take 2 tablets (650 mg total) by mouth every 6 (six) hours as needed for headache or mild pain. 10/18/21   Mercy Riding, MD  clopidogrel (PLAVIX) 75 MG tablet Take 1 tablet (75 mg total) by mouth daily. 05/24/21   Thurnell Lose, MD  cyanocobalamin (VITAMIN B12) 1000 MCG tablet Take 1,000 mcg by mouth every other day. 10/09/22   [provider]  EQ ASPIRIN ADULT LOW DOSE 81 MG tablet Take 81 mg by mouth daily. 10/09/22   [provider]  feeding supplement (ENSURE ENLIVE / ENSURE PLUS) LIQD Take 237 mLs  by mouth 3 (three) times daily between meals. 07/30/20   Ghimire, Henreitta Leber, MD  FEROSUL 325 (65 Fe) MG tablet Take 325 mg by mouth daily. 10/09/22   [provider]  fluticasone-salmeterol (ADVAIR) 500-50 MCG/ACT AEPB Inhale 1 puff into the lungs 2 (two) times daily. 04/13/21   [provider]  folic acid (FOLVITE) 1 MG tablet Take 1 mg by mouth daily. 10/09/22   [provider]  gabapentin (NEURONTIN) 300 MG capsule Take 300 mg by mouth 3 (three) times daily. 07/07/21   [provider]  INCRUSE ELLIPTA 62.5 MCG/INH AEPB INHALE 1 PUFF INTO THE LUNGS DAILY Patient taking differently: Inhale 1 puff into the lungs daily. 04/08/19   Mack Hook, MD  Lacosamide 100 MG TABS Take 1 tablet (100 mg total) by mouth 2 (two) times daily. 05/27/22 10/03/23  Erskine Emery, MD  levETIRAcetam (KEPPRA) 750 MG tablet Take 2 tablets (1,500 mg total) by mouth 2 (two) times daily. 03/20/21   Kathie Dike, MD  metoprolol tartrate (LOPRESSOR) 50 MG tablet Take 1 tablet (50 mg total) by mouth 2 (two) times daily. 03/20/21   Kathie Dike, MD  Multiple Vitamins-Minerals (DAILY VITAMIN FORMULA+MINERALS) TABS Take 1 tablet by mouth daily. 10/09/22   [provider]  omeprazole (PRILOSEC) 40 MG capsule Take 40 mg by mouth daily. 10/09/22   [provider]  PHENobarbital (LUMINAL) 32.4 MG tablet Take 1 tablet (32.4 mg total) by mouth in the  morning. 05/27/22   Erskine Emery, MD  PHENobarbital (LUMINAL) 64.8 MG tablet Take 1 tablet (64.8 mg total) by mouth at bedtime. Patient not taking: Reported on 10/02/2022 05/27/22   Erskine Emery, MD  rosuvastatin (CRESTOR) 10 MG tablet Take 1 tablet (10 mg total) by mouth daily. 09/28/20   Charlynne Cousins, MD  sertraline (ZOLOFT) 25 MG tablet Take 25 mg by mouth daily.    [provider]  thiamine (VITAMIN B1) 100 MG tablet Take 100 mg by mouth daily. 10/09/22   [provider]  Zinc Sulfate 220 (50 Zn) MG TABS  Take 1 tablet by mouth daily. 10/09/22   [provider]      Allergies    Patient has no known allergies.    Review of Systems   Review of Systems  Musculoskeletal:  Positive for extremity weakness.  Neurological:  Positive for seizures.    Physical Exam Updated Vital Signs BP (!) 160/88   Pulse 87   Temp 97.7 F (36.5 C) (Oral)   Resp 12   Ht 6\' 3"  (1.905 m)   Wt 73.7 kg   SpO2 100%   BMI 20.31 kg/m  Physical Exam Vitals and nursing note reviewed.  Constitutional:      General: He is not in acute distress. HENT:     Head: Normocephalic and atraumatic.     Nose: Nose normal.     Mouth/Throat:     Mouth: Mucous membranes are moist.  Eyes:     General: No scleral icterus. Cardiovascular:     Rate and Rhythm: Normal rate and regular rhythm.     Pulses: Normal pulses.     Heart sounds: Normal heart sounds.  Pulmonary:     Effort: Pulmonary effort is normal. No respiratory distress.     Breath sounds: No wheezing.  Abdominal:     Palpations: Abdomen is soft.     Tenderness: There is no abdominal tenderness. There is no guarding or rebound.  Musculoskeletal:     Cervical back: Normal range of motion.     Right lower leg: No edema.     Left lower leg: No edema.  Skin:    General: Skin is warm and dry.     Capillary Refill: Capillary refill takes less than 2 seconds.  Neurological:     Mental Status: He is alert. Mental status is at baseline.     Comments: Right upper extremity with 0/5 strength at the shoulder and elbow but able to grip 3/5  Left upper extremity 5/5 strength at shoulder elbow and grip.  Right lower extremity able to hold off the bed but slowly lowers  Left lower extremity 5/5 strength  Smile symmetric, alert and oriented x 3, slow to respond and follow commands No neglect, EOMI    Right upper extremity with rhythmic movement of hand specifically flexion extension of the right index finger and thumb  Psychiatric:        Mood and  Affect: Mood normal.        Behavior: Behavior normal.     ED Results / Procedures / Treatments   Labs (all labs ordered are listed, but only abnormal results are displayed) Labs Reviewed  CBC - Abnormal; Notable for the following components:      Result Value   RBC 3.83 (*)    Hemoglobin 11.5 (*)    HCT 34.8 (*)    RDW 17.1 (*)    nRBC 0.3 (*)    All other  components within normal limits  COMPREHENSIVE METABOLIC PANEL - Abnormal; Notable for the following components:   Calcium 8.4 (*)    Total Protein 6.1 (*)    Albumin 2.9 (*)    Alkaline Phosphatase 163 (*)    All other components within normal limits  I-STAT CHEM 8, ED - Abnormal; Notable for the following components:   Glucose, Bld 69 (*)    Calcium, Ion 1.05 (*)    Hemoglobin 12.6 (*)    HCT 37.0 (*)    All other components within normal limits  ETHANOL  PHENOBARBITAL LEVEL  LEVETIRACETAM LEVEL  CBG MONITORING, ED    EKG None  Radiology CT HEAD WO CONTRAST (5MM)  Result Date: 11/29/2022 CLINICAL DATA:  Seizure with head trauma. Fell at home. Right-sided weakness. EXAM: CT HEAD WITHOUT CONTRAST TECHNIQUE: Contiguous axial images were obtained from the base of the skull through the vertex without intravenous contrast. RADIATION DOSE REDUCTION: This exam was performed according to the departmental dose-optimization program which includes automated exposure control, adjustment of the mA and/or kV according to patient size and/or use of iterative reconstruction technique. COMPARISON:  CT 10/30/2022, MRI brain 10/18/2022 FINDINGS: Brain: There is moderate age advanced atrophy, with atrophic ventriculomegaly and moderately developed small-vessel disease of the cerebral white matter. There are multiple chronic lacunar infarcts in both cerebellar hemispheres. Again noted is a 1.2 cm noncalcified right parafalcine meningioma in the superior right frontal area best seen on 3:25 and 6:31. No new asymmetry is seen concerning for an  acute infarct, hemorrhage or mass. There is no midline shift. Mild asymmetric prominence of the left lateral ventricle is again shown. Vascular: The carotid siphons are heavily calcified. There is calcification and the V4 left vertebral artery. No hyperdense central vessel prove Skull: Negative for fractures or focal lesions. There is no visible scalp hematoma. Sinuses/Orbits: There is patchy opacification of the bilateral ethmoid air cells. There is mild membrane thickening in the maxillary sinuses, small retention cyst or polyp with 1 in the right maxillary sinus and another in the right sphenoid air cell. Trace fluid has developed in the left maxillary sinus. Other sinuses, bilateral mastoid air cells, and middle ears are clear. Negative orbits. There is a reverse S shaped nasal septum. Unilateral pneumatization left petrous apex. Other: None. IMPRESSION: 1. No acute intracranial CT findings or depressed skull fractures. 2. 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 3. 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 4. 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 5. Sinus disease with trace fluid in the left maxillary sinus. Electronically Signed   By: Telford Nab M.D.   On: 11/29/2022 05:22    Procedures Procedures    Medications Ordered in ED Medications  PHENobarbital (LUMINAL) tablet 32.4 mg (has no administration in time range)  PHENobarbital (LUMINAL) tablet 64.8 mg (has no administration in time range)  lacosamide (VIMPAT) 100 mg in sodium chloride 0.9 % 25 mL IVPB (has no administration in time range)  levETIRAcetam (KEPPRA) IVPB 1000 mg/100 mL premix (1,000 mg Intravenous New Bag/Given 11/29/22 0558)  levETIRAcetam (KEPPRA) IVPB 1500 mg/ 100 mL premix (has no administration in time range)  levETIRAcetam (KEPPRA) IVPB 1000 mg/100 mL premix (0 mg Intravenous Stopped 11/29/22 0442)  LORazepam (ATIVAN) injection 2 mg (2 mg Intravenous Given 11/29/22 0541)    ED  Course/ Medical Decision Making/ A&P Clinical Course as of 11/29/22 0607  Wed Nov 29, 2022  0304 Shaking right hand -   He fell and called out -  was on the floor but R hand was shaking ~1:30am.   ~69midnight in bathroom knocked some things over.   9pm normal went to bed.    He takes his own medications -- he's not taking meds.  [WF]  0330 Discussed with Dr. Earnestine Leys of neurology - MRI recommended and will do stat EEG [WF]    Clinical Course User Index [WF] Tedd Sias, PA                             Medical Decision Making Amount and/or Complexity of Data Reviewed Labs: ordered. Radiology: ordered.  Risk Prescription drug management.   This patient presents to the ED for concern of seizure-like activity, this involves a number of treatment options, and is a complaint that carries with it a moderate to high risk of complications and morbidity. A differential diagnosis was considered for the patient's symptoms which is discussed below:   The differential for seizures includes vascular issue such as AV malformations, stroke, encephalitis meningitis, Lyme disease, brain abscess, HIV, trauma, autoimmune conditions including SLE, vasculitis, hyponatremia other metabolic dyscrasias such as uremia or electrolyte abnormality, porphyria or hepatic encephalopathy, primary epilepsy, brain tumor, and specific syndromes (including Tuberous Sclerosis, Down's syndrome, Sturge Weber syndrome Von Hippel Lindau syndrome)  Also worth considering our intoxication, recreational drug use, alcohol intoxication or withdrawal, medication noncompliance.  Wellbutrin, diltiazem, verapamil, lidocaine, cephalosporins, tricyclic antidepressants, certain antineoplastics, lithium, fentanyl, tramadol, cocaine, Benadryl, Sudafed  Sleep deprivation, caffeine withdrawal, anxiety, stress, dehydration and other physiologic stressors can also lower seizure threshold.  Also with considering syncope although patient is  arrhythmic right hand twitching is more consistent with a partial focal seizure   Co morbidities: Discussed in HPI   Brief History:  Patient is a 67 year old male with past medical history significant for malnutrition, alcohol, tobacco use, COPD, history of stroke with residual right upper extremity weakness, seizures   Patient presents emergency room brought in by EMS for seizure-like activity. According to Tito Dine which is patient's daughter who he was with last 9 AM patient went to bed around 9 or 10 PM and around midnight walk to the bathroom and was heard laughing some things around the bathroom.  Then approximately 1:30 AM he called down after falling to the ground and was laying on the floor and had right upper extremity shaking consistent with prior seizure-like activity he has had.    Seems that patient is not consistently taking his medications that he is prescribed.  Tito Dine indicates that she has attempted to manage his medications in the past however patient is reluctant to allow other people to monitor his medications.  No CP or any other pain. No NVD.     EMR reviewed including pt PMHx, past surgical history and past visits to ER.   See HPI for more details   Lab Tests:  I ordered and independently interpreted labs. Labs notable for CMP with hypoalbuminemia otherwise normal, phenobarbital level and Keppra level ordered and pending.  CBC without leukocytosis mild anemia  Imaging Studies:  NAD. I personally reviewed all imaging studies and no acute abnormality found. I agree with radiology interpretation. Chronic findings.   IMPRESSION:  1. No acute intracranial CT findings or depressed skull fractures.  2. 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  3. 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  4. 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  5. Sinus disease with trace fluid in the left  maxillary sinus.    Cardiac  Monitoring:  The patient was maintained on a cardiac monitor.  I personally viewed and interpreted the cardiac monitored which showed an underlying rhythm of: NSR EKG non-ischemic   Medicines ordered:  I ordered medication including IV Keppra for seizure Reevaluation of the patient after these medicines showed that the patient improved I have reviewed the patients home medicines and have made adjustments as needed   Critical Interventions:     Consults/Attending Physician   I requested consultation with Dr. Jolaine Artist of neurology,  and discussed lab and imaging findings as well as pertinent plan - they recommend: MRI brain and will evaluate patient   Reevaluation:  After the interventions noted above I re-evaluated patient and found that they have :improved  6:29 AM currently on EEG monitor.  Will hold off on reevaluating patient's neurologic status. On my reevaluation patient is no longer demonstrating any involuntary movement of his right upper extremity.  Social Determinants of Health:  The patient's social determinants of health were a factor in the care of this patient    Problem List / ED Course:  Patient here with symptoms consistent with partial focal seizure.  Neurology has evaluated the patient and he is currently undergoing EEG.  Will need to touch base with neurology once EEG and MRI are completed.  Patient care handed off to morning team.   Dispostion:  Pending EEG, MRI and reevaluation.   Final Clinical Impression(s) / ED Diagnoses Final diagnoses:  None    Rx / DC Orders ED Discharge Orders     None         Tedd Sias, [place] 11/29/22 [id number]    Orpah Greek, MD 11/29/22 [phone]

## 2022-11-29 NOTE — ED Notes (Signed)
Pt returned from MRI °

## 2022-11-29 NOTE — Progress Notes (Signed)
EEG complete - results pending 

## 2022-11-29 NOTE — ED Notes (Signed)
EEG tech at  bedside to remove eeg monitoring for transport

## 2022-11-29 NOTE — Progress Notes (Signed)
LTM EEG re-hooked new leads used. Test event button. Pt not being monitored by [facility] at this time.

## 2022-11-29 NOTE — ED Notes (Signed)
RN heard movement from bedside commode and went to room to assess if pt was finished using. RN found pt sitting upright on the floor. Pt was alert. Denies pain and no injuries observed. Pt denies hitting his head. Pt states he did not use the call light because " I didn't want to bug you." RN assisted pt back to stretcher safely. Call light returned to stretcher and pt agrees to use.

## 2022-11-29 NOTE — ED Notes (Signed)
Pt used call light appropriately to request help with voiding in urinal.

## 2022-11-29 NOTE — Procedures (Signed)
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 EEG to evaluate for seizure  Level of alertness: awake  AEDs during EEG study: LEV, LCM, Phenobarb  Technical aspects: This EEG study was done with scalp electrodes positioned according to the 10-20 International system of electrode placement. Electrical activity was reviewed with band pass filter of 1-70Hz , sensitivity of 7 uV/mm, display speed of 70mm/sec with a 60Hz  notched filter applied as appropriate. EEG data were recorded continuously and digitally stored.  Video monitoring was available and reviewed as appropriate.  Description: The posterior dominant rhythm consists of 8-9 Hz activity of moderate voltage (25-35 uV) seen predominantly in posterior head regions, symmetric and reactive to eye opening and eye closing. EEG showed continuous 3 to 6 Hz theta-delta slowing in left hemisphere. Lateralized periodic discharges were noted in left hemisphere with evolution in morphology and frequency. Clinically, patient was less responsive, occasionally starting off. Per chart review at times there is right upper extremity twitching as well. This EEG pattern is consistent with electrographic status epilepticus. Hyperventilation and photic stimulation were not performed.     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    Daveyon Kitchings [name]

## 2022-11-29 NOTE — ED Notes (Signed)
Pt transported to MRI 

## 2022-11-30 ENCOUNTER — Other Ambulatory Visit (HOSPITAL_COMMUNITY): Payer: Self-pay

## 2022-11-30 DIAGNOSIS — F419 Anxiety disorder, unspecified: Secondary | ICD-10-CM | POA: Diagnosis present

## 2022-11-30 DIAGNOSIS — Z79899 Other long term (current) drug therapy: Secondary | ICD-10-CM | POA: Diagnosis not present

## 2022-11-30 DIAGNOSIS — F17210 Nicotine dependence, cigarettes, uncomplicated: Secondary | ICD-10-CM | POA: Diagnosis present

## 2022-11-30 DIAGNOSIS — G40101 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, not intractable, with status epilepticus: Secondary | ICD-10-CM | POA: Diagnosis present

## 2022-11-30 DIAGNOSIS — I10 Essential (primary) hypertension: Secondary | ICD-10-CM | POA: Diagnosis present

## 2022-11-30 DIAGNOSIS — E785 Hyperlipidemia, unspecified: Secondary | ICD-10-CM | POA: Diagnosis present

## 2022-11-30 DIAGNOSIS — Z89432 Acquired absence of left foot: Secondary | ICD-10-CM | POA: Diagnosis not present

## 2022-11-30 DIAGNOSIS — F32A Depression, unspecified: Secondary | ICD-10-CM | POA: Diagnosis present

## 2022-11-30 DIAGNOSIS — G40901 Epilepsy, unspecified, not intractable, with status epilepticus: Secondary | ICD-10-CM | POA: Diagnosis present

## 2022-11-30 DIAGNOSIS — R569 Unspecified convulsions: Secondary | ICD-10-CM | POA: Diagnosis not present

## 2022-11-30 DIAGNOSIS — I69341 Monoplegia of lower limb following cerebral infarction affecting right dominant side: Secondary | ICD-10-CM | POA: Diagnosis not present

## 2022-11-30 DIAGNOSIS — Z7982 Long term (current) use of aspirin: Secondary | ICD-10-CM | POA: Diagnosis not present

## 2022-11-30 DIAGNOSIS — Z7902 Long term (current) use of antithrombotics/antiplatelets: Secondary | ICD-10-CM | POA: Diagnosis not present

## 2022-11-30 DIAGNOSIS — Z8249 Family history of ischemic heart disease and other diseases of the circulatory system: Secondary | ICD-10-CM | POA: Diagnosis not present

## 2022-11-30 DIAGNOSIS — N400 Enlarged prostate without lower urinary tract symptoms: Secondary | ICD-10-CM | POA: Diagnosis present

## 2022-11-30 DIAGNOSIS — E11649 Type 2 diabetes mellitus with hypoglycemia without coma: Secondary | ICD-10-CM | POA: Diagnosis present

## 2022-11-30 DIAGNOSIS — Z7951 Long term (current) use of inhaled steroids: Secondary | ICD-10-CM | POA: Diagnosis not present

## 2022-11-30 DIAGNOSIS — M109 Gout, unspecified: Secondary | ICD-10-CM | POA: Diagnosis present

## 2022-11-30 DIAGNOSIS — J449 Chronic obstructive pulmonary disease, unspecified: Secondary | ICD-10-CM | POA: Diagnosis present

## 2022-11-30 DIAGNOSIS — F1020 Alcohol dependence, uncomplicated: Secondary | ICD-10-CM | POA: Diagnosis present

## 2022-11-30 DIAGNOSIS — Z7409 Other reduced mobility: Secondary | ICD-10-CM | POA: Diagnosis present

## 2022-11-30 LAB — GLUCOSE, CAPILLARY: Glucose-Capillary: 99 mg/dL (ref 70–99)

## 2022-11-30 MED ORDER — LACOSAMIDE 150 MG PO TABS
150.0000 mg | ORAL_TABLET | Freq: Two times a day (BID) | ORAL | 0 refills | Status: DC
  Filled 2022-11-30: qty 60, 30d supply, fill #0

## 2022-11-30 MED ORDER — CLONAZEPAM 1 MG PO TABS
1.0000 mg | ORAL_TABLET | Freq: Two times a day (BID) | ORAL | 0 refills | Status: DC
  Filled 2022-11-30: qty 30, 15d supply, fill #0

## 2022-11-30 MED ORDER — CLONAZEPAM 1 MG PO TABS
1.0000 mg | ORAL_TABLET | Freq: Two times a day (BID) | ORAL | Status: DC
  Administered 2022-11-30 – 2022-12-01 (×2): 1 mg via ORAL
  Filled 2022-11-30 (×2): qty 1

## 2022-11-30 MED ORDER — ORAL CARE MOUTH RINSE: 15.0000 mL | OROMUCOSAL | Status: DC | PRN

## 2022-11-30 NOTE — Progress Notes (Signed)
LTM EEG disconnected - no skin breakdown at unhook. [facility] notified.  

## 2022-11-30 NOTE — Progress Notes (Signed)
Pt SBP noted to be 150-160s. Pt denies symptoms. Dr. Pietro Cassis informed. No new orders at this time.

## 2022-11-30 NOTE — Evaluation (Signed)
Occupational Therapy Evaluation Patient Details Name: [name] MRN: [id number] DOB: Feb 15, 1956 Today's Date: 11/30/2022   History of Present Illness 67 y.o. 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 MRI brain showed stable findings without any acute intracranial pathology.   Clinical Impression   Pt currently with functional limitations due to the deficits listed below (see OT Problem List). Pt reports that prior to admit, he was living with his Daughter and received assistance for BADL tasks; primarily LB ADLs. He uses a [facility] for functional mobility. Pt has been in the hospital several times for occurrence of seizures. Pt reports difficulty managing his seizure medication. He has a pill box at home but when sorting his pills he states the pills don't always make it in the pill tray like he thinks they do. Recommend direct supervision for medication management and pillbox organization from daughter when returning home. Pt will benefit from acute skilled OT to increase their safety and independence with ADL and functional mobility for ADL to facilitate discharge. Pt would benefit from post acute rehab prior to return home. OT will continue to follow patient acutely.       Recommendations for follow up therapy are one component of a multi-disciplinary discharge planning process, led by the attending physician.  Recommendations may be updated based on patient status, additional functional criteria and insurance authorization.   Assistance Recommended at Discharge Intermittent Supervision/Assistance  Patient can return home with the following A little help with bathing/dressing/bathroom;A lot of help with walking and/or transfers;Assistance with  cooking/housework;Help with stairs or ramp for entrance;Assist for transportation;Direct supervision/assist for medications management    Functional Status Assessment  Patient has had a recent decline in their functional status and demonstrates the ability to make significant improvements in function in a reasonable and predictable amount of time.  Equipment Recommendations  None recommended by OT       Precautions / Restrictions Precautions Precautions: Fall;Other (comment) Precaution Comments: seizures Restrictions Weight Bearing Restrictions: No      Mobility Bed Mobility Overal bed mobility: Needs Assistance Bed Mobility: Supine to Sit     Supine to sit: Min guard, HOB elevated       Patient Response: Cooperative  Transfers Overall transfer level: Needs assistance Equipment used: 2 person hand held assist Transfers: Sit to/from Stand, Bed to chair/wheelchair/BSC Sit to Stand: Min assist, From elevated surface     Step pivot transfers: Min guard, +2 safety/equipment            Balance Overall balance assessment: Needs assistance Sitting-balance support: Feet supported, No upper extremity supported Sitting balance-[name] Scale: Good Sitting balance - Comments: Able to sit EOB and manage socks and shoes.   Standing balance support: During functional activity, Bilateral upper extremity supported Standing balance-[name] Scale: Poor Standing balance comment: requires UE Support during functional mobility. Presents with posterior lean.       ADL either performed or assessed with clinical judgement   ADL Overall ADL's : Needs assistance/impaired Eating/Feeding: Set up;Bed level   Grooming: Oral care;Wash/dry hands;Wash/dry face;Sitting;Moderate assistance;Brushing hair   Upper Body Bathing: Moderate assistance;Sitting   Lower Body Bathing: Maximal assistance;Sit to/from stand   Upper Body Dressing : Moderate assistance;Sitting   Lower Body Dressing: Maximal  assistance;Sit to/from stand   Toilet Transfer: Minimal assistance;+2 for physical assistance;Ambulation;Regular Toilet;Grab bars  Toileting- Clothing Manipulation and Hygiene: Total assistance;Sit to/from stand         Vision Baseline Vision/History: 1 Wears glasses (bifocals. States that his eyes are good. Reports distance is fine but he has difficulty with close up. Later stated he didn't have difficulty with close up reading.) Patient Visual Report: No change from baseline Vision Assessment?: No apparent visual deficits            Pertinent Vitals/Pain Pain Assessment Pain Assessment: Faces Faces Pain Scale: No hurt     Hand Dominance Left   Extremity/Trunk Assessment Upper Extremity Assessment RUE Deficits / Details: Hx of CVA with residual weakness and limited functional use. Decreased A/ROM with impaired gross grasp. Fingers remain in partial flexed position at rest and unable to extend to open hand. Tremors noted in RUE.   Lower Extremity Assessment Lower Extremity Assessment: Generalized weakness RLE Deficits / Details: hx of CVA with residual weakness LLE Deficits / Details: left TM amputation   Cervical / Trunk Assessment Cervical / Trunk Assessment: Kyphotic   Communication Communication Communication: No difficulties   Cognition Arousal/Alertness: Awake/alert Behavior During Therapy: WFL for tasks assessed/performed, Impulsive Overall Cognitive Status: No family/caregiver present to determine baseline cognitive functioning    General Comments: Follows commands well. Asking if chair is locked before he sits. Slighty impulsive with decreased judgement. Noted in chart that pt attempted to get off [facility] without assistance in ED and fell on the floor.     General Comments  VSS on RA.            Home Living Family/patient expects to be discharged to:: Private residence Living Arrangements: Parent;Other relatives (mother and sister) Available Help at  Discharge: Family Type of Home: House Home Access: Stairs to enter [employer] of Steps: " a few" Entrance Stairs-Rails: Left Home Layout: Multi-level Alternate Level Stairs-Number of Steps: flight down to the basement, pt has a suite in basement. Alternate Level Stairs-Rails: Left Bathroom Shower/Tub: Tub/shower unit   Bathroom Toilet: Standard Bathroom Accessibility: Yes   Home Equipment: Cane - single point;Rolling Yang (2 wheels);Shower seat;Grab bars - toilet;Grab bars - tub/shower   Additional Comments: grab bars on left hand side of toilet      Prior Functioning/Environment Prior Level of Function : Needs assist       Physical Assist : ADLs (physical)   ADLs (physical): Dressing;Bathing Mobility Comments: Reports that he uses a [facility] ADLs Comments: daughter helped with dressing and bathing. Reports most difficulty with LB ADL and washing his back.        OT Problem List: Decreased safety awareness;Decreased activity tolerance;Impaired balance (sitting and/or standing);Decreased knowledge of use of DME or AE      OT Treatment/Interventions: Self-care/ADL training;Therapeutic exercise;Therapeutic activities;Neuromuscular education;Energy conservation;DME and/or AE instruction;Patient/family education;Manual therapy;Modalities;Balance training    OT Goals(Current goals can be found in the care plan section) Acute Rehab OT Goals Patient Stated Goal: to go home  OT Frequency: Min 2X/week    Co-evaluation PT/OT/SLP Co-Evaluation/Treatment: Yes Reason for Co-Treatment: To address functional/ADL transfers   OT goals addressed during session: ADL's and self-care;Strengthening/ROM      AM-PAC OT "6 Clicks" Daily Activity     Outcome Measure Help from another person eating meals?: A Little Help from another person taking care of personal grooming?: A Little Help from another person toileting, which includes using toliet, bedpan, or urinal?: Total Help from  another person bathing (including washing, rinsing, drying)?: A Lot Help from another person to put on and  taking off regular upper body clothing?: A Little Help from another person to put on and taking off regular lower body clothing?: A Lot 6 Click Score: 14   End of Session Equipment Utilized During Treatment: Gait belt  Activity Tolerance: Patient tolerated treatment well Patient left: in chair;with call bell/phone within reach;with chair alarm set  OT Visit Diagnosis: History of falling (Z91.81);Muscle weakness (generalized) (M62.81);Unsteadiness on feet (R26.81)                Time: [id number] OT Time Calculation (min): 41 min Charges:  OT General Charges $OT Visit: 1 Visit OT Evaluation $OT Eval High Complexity: 1 High OT Treatments $Therapeutic Activity: 8-22 mins  Ailene Ravel, OTR/L,CBIS  Supplemental OT - [facility] and [facility] Secure Chat Preferred    Jezel Basto, Clarene Duke 11/30/2022, 3:59 PM

## 2022-11-30 NOTE — Evaluation (Signed)
Physical Therapy Evaluation Patient Details Name: Seth [name] MRN: [id number] DOB: 01-08-1956 Today's Date: 11/30/2022  History of Present Illness  67 yo male [facility] with fall at home, suspect seizure activity. PMH - CVA with rt residual weakness,SDH (5/22-likely traumatic) EtOH use, seizure disorder, L transmetatarsal amputation, rt rib fx's, polysubstance abuse, pvd, gout, copd  Clinical Impression   Pt presents with generalized weakness R>L given history of CVA, impaired balance, impaired gait, and decreased activity tolerance. Pt to benefit from acute PT to address deficits. Pt ambulated hallway distance with HHA, overall requiring light physical assist to mobilize at this time. PT to progress mobility as tolerated, and will continue to follow acutely.         Recommendations for follow up therapy are one component of a multi-disciplinary discharge planning process, led by the attending physician.  Recommendations may be updated based on patient status, additional functional criteria and insurance authorization.  Follow Up Recommendations Can patient physically be transported by private vehicle: Yes     Assistance Recommended at Discharge Intermittent Supervision/Assistance  Patient can return home with the following  A little help with walking and/or transfers;A little help with bathing/dressing/bathroom;Help with stairs or ramp for entrance;Assist for transportation;Assistance with cooking/housework    Equipment Recommendations None recommended by PT  Recommendations for Other Services       Functional Status Assessment Patient has had a recent decline in their functional status and demonstrates the ability to make significant improvements in function in a reasonable and predictable amount of time.     Precautions / Restrictions Precautions Precautions: Fall;Other (comment) Precaution Comments: seizures Restrictions Weight Bearing Restrictions: No       Mobility  Bed Mobility Overal bed mobility: Needs Assistance Bed Mobility: Supine to Sit     Supine to sit: Min guard, HOB elevated          Transfers Overall transfer level: Needs assistance Equipment used: 2 person hand held assist Transfers: Sit to/from Stand Sit to Stand: Min assist, From elevated surface, +2 safety/equipment           General transfer comment: assist for rise and steady, slow to rise    Ambulation/Gait Ambulation/Gait assistance: Min assist, +2 safety/equipment Gait Distance (Feet): 150 Feet Assistive device: 2 person hand held assist Gait Pattern/deviations: Step-through pattern, Trunk flexed, Decreased step length - right, Decreased dorsiflexion - right Gait velocity: decr     General Gait Details: assist to steady, cues for increasing R foot clearance  Stairs            Wheelchair Mobility    Modified Rankin (Stroke Patients Only)       Balance Overall balance assessment: Needs assistance Sitting-balance support: Feet supported, No upper extremity supported Sitting balance-[name] Scale: Good Sitting balance - Comments: Able to sit EOB and manage socks and shoes.   Standing balance support: During functional activity, Bilateral upper extremity supported Standing balance-[name] Scale: Poor Standing balance comment: requires UE Support during functional mobility. Presents with posterior lean.                             Pertinent Vitals/Pain Pain Assessment Pain Assessment: Faces Faces Pain Scale: No hurt Pain Intervention(s): Monitored during session, Premedicated before session    [place] expects to be discharged to:: Private residence Living Arrangements: Parent;Other relatives (mother and sister) Available Help at Discharge: Family Type of Home: House Home Access: Stairs to  enter Entrance Stairs-Rails: Left Entrance Stairs-Number of Steps: " a few" Alternate Level Stairs-Number of  Steps: flight down to the basement, pt has a suite in basement. Home Layout: Multi-level Home Equipment: Cane - single [employer]ista (2 wheels);Shower seat;Grab bars - toilet;Grab bars - tub/shower Additional Comments: grab bars on left hand side of toilet    Prior Function Prior Level of Function : Needs assist       Physical Assist : ADLs (physical)   ADLs (physical): Dressing;Bathing Mobility Comments: Reports that he uses a [facility] ADLs Comments: daughter helped with dressing and bathing. Reports most difficulty with LB ADL and washing his back.     Hand Dominance   Dominant Hand: Left    Extremity/Trunk Assessment   Upper Extremity Assessment Upper Extremity Assessment: Defer to OT evaluation RUE Deficits / Details: Hx of CVA with residual weakness and limited functional use. Decreased A/ROM with impaired gross grasp. Fingers remain in partial flexed position at rest and unable to extend to open hand. Tremors noted in RUE.    Lower Extremity Assessment Lower Extremity Assessment: Generalized weakness;RLE deficits/detail RLE Deficits / Details: hx of CVA with residual weakness LLE Deficits / Details: left TM amputation    Cervical / Trunk Assessment Cervical / Trunk Assessment: Kyphotic  Communication   Communication: No difficulties  Cognition Arousal/Alertness: Awake/alert Behavior During Therapy: WFL for tasks assessed/performed, Impulsive Overall Cognitive Status: No family/caregiver present to determine baseline cognitive functioning                                 General Comments: Follows commands well. Asking if chair is locked before he sits. Slighty impulsive with decreased judgement. Noted in chart that pt attempted to get off [facility] without assistance in ED and fell on the floor.        General Comments General comments (skin integrity, edema, etc.): shoes donned given L TMA (with orthotic in it)    Exercises     Assessment/Plan     PT Assessment Patient needs continued PT services  PT Problem List Decreased strength;Decreased mobility;Decreased balance;Decreased safety awareness;Cardiopulmonary status limiting activity;Decreased activity tolerance       PT Treatment Interventions Therapeutic activities;Gait training;Therapeutic exercise;Patient/family education;Balance training;Stair training;Functional mobility training;Neuromuscular re-education;DME instruction    PT Goals (Current goals can be found in the Care Plan section)  Acute Rehab PT Goals Patient Stated Goal: to go home PT Goal Formulation: With patient Time For Goal Achievement: 12/14/22 Potential to Achieve Goals: Good    Frequency Min 3X/week     Co-evaluation   Reason for Co-Treatment: To address functional/ADL transfers   OT goals addressed during session: ADL's and self-care;Strengthening/ROM       AM-PAC PT "6 Clicks" Mobility  Outcome Measure Help needed turning from your back to your side while in a flat bed without using bedrails?: A Little Help needed moving from lying on your back to sitting on the side of a flat bed without using bedrails?: A Little Help needed moving to and from a bed to a chair (including a wheelchair)?: A Little Help needed standing up from a chair using your arms (e.g., wheelchair or bedside chair)?: A Little Help needed to walk in hospital room?: A Little Help needed climbing 3-5 steps with a railing? : A Lot 6 Click Score: 17    End of Session Equipment Utilized During Treatment: Gait belt Activity Tolerance: Patient tolerated treatment well Patient left:  in chair;with call bell/phone within reach;with chair alarm set Nurse Communication: Mobility status PT Visit Diagnosis: Difficulty in walking, not elsewhere classified (R26.2);Unsteadiness on feet (R26.81)    Time: [id number] PT Time Calculation (min) (ACUTE ONLY): 41 min   Charges:   PT Evaluation $PT Eval Low Complexity: 1 Low           Daneil Beem S, PT DPT Acute Rehabilitation Services Pager [phone]  Office [phone]   Roxine Caddy E Ruffin Pyo 11/30/2022, 4:59 PM

## 2022-11-30 NOTE — Progress Notes (Signed)
PROGRESS NOTE  Seth [name]  DOB: 1956/05/17  PCP: Seth Lei, MD [id number]  DOA: 11/29/2022  LOS: 0 days  [name] Day: 2  Brief narrative: Seth [name] is a 67 y.o. male with PMH significant for seizure disorder on Vimpat, Keppra and phenobarbital, alcohol abuse, stroke with residual right lower extremity weakness, HTN, HLD, anxiety/depression 4/3, patient presented to ED at [name] with complaint of right-sided weakness.  In the ED, he was noted to have right arm twitching.  He was last seen inpatient by neurology 2 months ago when he was admitted for breakthrough seizure with Todd's paralysis.  He was discharged on Keppra 1500 mg twice daily, Vimpat 100 mg twice daily and phenobarb 32.4 mg every morning and 64.8 mg nightly.   He was seen by neurology in the ED.  On exam, he was noted to have mild lethargy, decreased level of alertness and occasional subtle right upper extremity twitching, subtle right facial droop and mild dysarthria.  MRI brain showed stable findings without any acute intracranial pathology. Overall clinical picture was most consistent with focal status epilepticus.  Stat EEG showed continuous high amplitude rhythmic left hemisphere activity appearing most consistent with electrographic status epilepticus.  He was started on IV Keppra and IV Ativan Medication noncompliance was also suspected but patient claimed that he has been compliant with his seizure medications and his last drink was the evening prior.   Per neurology recommendation, patient was transferred to [name] for long-term EEG monitoring.  Subjective: Patient was seen and examined this morning.  Pleasant elderly African-American male.  Cheerful.  Propped up in bed.  Awake. Chart reviewed Overnight, afebrile, heart rate in 60s, blood pressure 160s this morning, breathing on room air Glucose level was low yesterday morning.  Assessment and plan: Status epilepticus with Todd's  paresis History of seizure disorder Unclear compliance to AEDs Presented with right-sided weakness, mild lethargy  Stroke ruled out by MRI. Seen by neurology Stat EEG showed status epilepticus Sent to [name] for long-term EEG monitoring.   Seen by neurologist.  No seizures noted this morning.  Recommended to continue Keppra 50 mg twice daily and increase the dose of Vimpat to 150 mg twice daily and phenobarbital to 65 mg twice daily.  Clonazepam dose was also increased to 1 mg twice daily Seizure precautions.  Chronic alcoholism No acute symptoms signs of active withdrawal Continue thiamine folate and multivitamin   History of COPD No acute symptoms of exacerbation Continue LABA and ICS   History of CVA Stroke ruled out.   Continue Plavix aspirin and Crestor   HTN, uncontrolled Continue metoprolol  Hypoglycemia Blood sugar level was low at 60s yesterday.   A1c 5 in 2022 With improvement in appetite, blood sugar level has improved.  Impaired mobility Discussed with patient's daughter Ms. Seth [name].  She is requesting an eval by PT for possible SNF placement.  Order placed.  Goals of care   Code Status: Full Code     DVT prophylaxis:  enoxaparin (LOVENOX) injection 40 mg Start: 11/29/22 1430   Antimicrobials: none Fluid: NS at 42 mill per hour Consultants: Neurology Family Communication: Discussed with patient's daughter  Status: Observation Level of care:  Progressive   Needs to continue in-[name] care:  Pending PT eval  Patient from: Home Anticipated d/c to: Pending PT eval   Diet:  Diet Order             Diet Heart Fluid consistency: Thin  Diet effective now  Scheduled Meds:  aspirin EC  81 mg Oral Daily   clonazePAM  1 mg Oral BID   clopidogrel  75 mg Oral Daily   cyanocobalamin  1,000 mcg Oral QODAY   enoxaparin (LOVENOX) injection  40 mg Subcutaneous Q24H   feeding supplement  237 mL Oral TID BM   folic acid  1 mg  Oral Daily   gabapentin  300 mg Oral TID   lacosamide  150 mg Oral BID   levETIRAcetam  1,500 mg Oral BID   metoprolol tartrate  50 mg Oral BID   mometasone-formoterol  2 puff Inhalation BID   multivitamin with minerals  1 tablet Oral Daily   mouth rinse  15 mL Mouth Rinse Q2H   pantoprazole  40 mg Oral Daily   PHENobarbital  64.8 mg Oral BID   rosuvastatin  10 mg Oral Daily   sertraline  25 mg Oral Daily   thiamine  100 mg Oral Daily   Or   thiamine  100 mg Intravenous Daily   umeclidinium bromide  1 puff Inhalation Daily    PRN meds: acetaminophen, LORazepam **OR** LORazepam, midazolam, mouth rinse   Infusions:   sodium chloride 75 mL/hr (11/29/22 1513)    Antimicrobials: Anti-infectives (From admission, onward)    None       Nutritional status:  Body mass index is 20.31 kg/m.          Objective: Vitals:   11/30/22 0950 11/30/22 1127  BP: (!) 158/80 138/88  Pulse: (!) 58 74  Resp: 18 15  Temp:  97.7 F (36.5 C)  SpO2: 99% 99%    Intake/Output Summary (Last 24 hours) at 11/30/2022 1300 Last data filed at 11/30/2022 0528 Gross per 24 hour  Intake 314.57 ml  Output 550 ml  Net -235.43 ml   Filed Weights   11/29/22 0256  Weight: 73.7 kg   Weight change:  Body mass index is 20.31 kg/m.   Physical Exam: General exam: Pleasant, elderly African-American male.  Propped up in bed.  Cheerful.  Not in pain not in distress Skin: No rashes, lesions or ulcers. HEENT: Atraumatic, normocephalic, no obvious bleeding Lungs: Clear to auscultate bilaterally CVS: Regular rate and rhythm, no murmur GI/Abd soft, not tender, nondistended, bowel sound present CNS: Alert, awake, cheerful.  Oriented to place and person.  Has deficits from previous stroke Psychiatry: Mood appropriate. Extremities: No pedal edema, no calf tenderness  Data Review: I have personally reviewed the laboratory data and studies available.  F/u labs  Unresulted Labs (From admission, onward)      Start     Ordered   11/29/22 0301  Levetiracetam level  Once,   URGENT        11/29/22 0301            Total time spent in review of labs and imaging, patient evaluation, formulation of plan, documentation and communication with family: 66 minutes  Signed, Terrilee Croak, MD [facility] 11/30/2022

## 2022-11-30 NOTE — Progress Notes (Addendum)
Subjective: No seizures this morning. Patient denies any seizures overnight but is a poor historian.   ROS: negative except above  Examination  Vital signs in last 24 hours: Temp:  [97.7 F (36.5 C)-98.4 F (36.9 C)] 97.7 F (36.5 C) (04/04 1127) Pulse Rate:  [58-94] 74 (04/04 1127) Resp:  [14-22] 15 (04/04 1127) BP: (132-175)/(72-114) 138/88 (04/04 1127) SpO2:  [97 %-100 %] 99 % (04/04 1127)  General: lying in bed, NAD Neuro: MS: Alert, oriented, follows commands CN: pupils equal and reactive,  EOMI, face symmetric, tongue midline, normal sensation over face Motor: 5/5 in LUE/LLE/RLE. 4/5 in RUE Sensory: decreased sensation to light touch in RUE/RLE   Basic Metabolic Panel: Recent Labs  Lab 11/29/22 0315 11/29/22 0322  NA 138 139  K 3.6 3.7  CL 105 105  CO2 22  --   GLUCOSE 73 69*  BUN 8 9  CREATININE 0.87 0.70  CALCIUM 8.4*  --     CBC: Recent Labs  Lab 11/29/22 0315 11/29/22 0322  WBC 6.0  --   HGB 11.5* 12.6*  HCT 34.8* 37.0*  MCV 90.9  --   PLT 247  --     Coagulation Studies: No results for input(s): "LABPROT", "INR" in the last 72 hours.  Imaging No new brain imaging   ASSESSMENT AND PLAN: 67yo M with focal convulsive status epilepticus.   Focal convulsive status epilepticus - etiology: could be due to medication non compliance ( levels not checked), also has h/o alcohol use   Recommendations -Continue Vimpat to 150mg  BID, Phenobarb 65mg  BID, Keppra 1500mg  BID - Increasing Clonazepam to 1mg  BID as LPDs are still present at 1Hz  but this can potentially be weaned off as outpatient if patient remains seizure free. - DC LTM as no further seizures.  - PT/OT - CIWA protocol - PRN IV versed for clinical seizures - Seizure precautions - Discussed plan with Dr Pietro Cassis [facility]) - F/u with Dr April Manson ( order placed)  Seizure precautions: Per [facility] statutes, patients with seizures are not allowed to drive until they have been  seizure-free for six months and cleared by a physician    Use caution when using heavy equipment or power tools. Avoid working on ladders or at heights. Take showers instead of baths. Ensure the water temperature is not too high on the home water heater. Do not go swimming alone. Do not lock yourself in a room alone (i.e. bathroom). When caring for infants or small children, sit down when holding, feeding, or changing them to minimize risk of injury to the child in the event you have a seizure. Maintain good sleep hygiene. Avoid alcohol.    If patient has another seizure, call 911 and bring them back to the ED if: A.  The seizure lasts longer than 5 minutes.      B.  The patient doesn't wake shortly after the seizure or has new problems such as difficulty seeing, speaking or moving following the seizure C.  The patient was injured during the seizure D.  The patient has a temperature over 102 F (39C) E.  The patient vomited during the seizure and now is having trouble breathing    During the Seizure   - First, ensure adequate ventilation and place patients on the floor on their left side  Loosen clothing around the neck and ensure the airway is patent. If the patient is clenching the teeth, do not force the mouth open with any object as this  can cause severe damage - Remove all items from the surrounding that can be hazardous. The patient may be oblivious to what's happening and may not even know what he or she is doing. If the patient is confused and wandering, either gently guide him/her away and block access to outside areas - Reassure the individual and be comforting - Call 911. In most cases, the seizure ends before EMS arrives. However, there are cases when seizures may last over 3 to 5 minutes. Or the individual may have developed breathing difficulties or severe injuries. If a pregnant patient or a person with diabetes develops a seizure, it is prudent to call an ambulance. - Finally, if  the patient does not regain full consciousness, then call EMS. Most patients will remain confused for about 45 to 90 minutes after a seizure, so you must use judgment in calling for help.    After the Seizure (Postictal Stage)   After a seizure, most patients experience confusion, fatigue, muscle pain and/or a headache. Thus, one should permit the individual to sleep. For the next few days, reassurance is essential. Being calm and helping reorient the person is also of importance.   Most seizures are painless and end spontaneously. Seizures are not harmful to others but can lead to complications such as stress on the lungs, brain and the heart. Individuals with prior lung problems may develop labored breathing and respiratory distress.    I have spent a total of  36  minutes with the patient reviewing hospital notes,  test results, labs and examining the patient as well as establishing an assessment and plan that was discussed personally with the patient.  > 50% of time was spent in direct patient care.       Zeb Comfort Epilepsy [facility] For questions after 5pm please refer to AMION to reach the Neurologist on call

## 2022-12-01 ENCOUNTER — Other Ambulatory Visit (HOSPITAL_COMMUNITY): Payer: Self-pay

## 2022-12-01 LAB — LEVETIRACETAM LEVEL: Levetiracetam Lvl: 9.2 ug/mL — ABNORMAL LOW (ref 10.0–40.0)

## 2022-12-01 MED ORDER — PHENOBARBITAL 64.8 MG PO TABS: 64.8000 mg | ORAL_TABLET | Freq: Two times a day (BID) | ORAL | 0 refills | Status: DC

## 2022-12-01 MED ORDER — LACOSAMIDE 150 MG PO TABS: 150.0000 mg | ORAL_TABLET | Freq: Two times a day (BID) | ORAL | 0 refills | Status: DC

## 2022-12-01 MED ORDER — CLONAZEPAM 1 MG PO TABS: 1.0000 mg | ORAL_TABLET | Freq: Two times a day (BID) | ORAL | 0 refills | Status: DC

## 2022-12-01 NOTE — TOC Transition Note (Signed)
Transition of Care [facility]) - CM/SW Discharge Note   Patient Details  Name: Seth [name] MRN: [id number] Date of Birth: 08-19-1956  Transition of Care [facility]) CM/SW Contact:  Ashdon Gillson A [place], Theresia Majors Phone Number: 12/01/2022, 3:18 PM   Clinical Narrative:    Berkley Harvey approved for today. [facility] admit today. Pt and daughter updated.  Auth approved for 12/01/22-12/05/22 Ref [id number]   Patient will DC to: [facility]  Anticipated DC date: 12/01/22  Family notified: Sabino Niemann  Transport by: Sharin Mons      Per MD patient ready for DC to [facility] . RN, patient, patient's family, and facility notified of DC. Discharge Summary and FL2 sent to facility. RN to call report prior to discharge [phone]). DC packet on chart. Ambulance transport requested for patient.     CSW will sign off for now as social work intervention is no longer needed. Please consult [place] again if new needs arise.    Final next level of care: Skilled Nursing Facility Barriers to Discharge: No Barriers Identified   Patient Goals and CMS Choice   Choice offered to / list presented to : Patient  Discharge Placement                Patient chooses bed at:  [facility]) Patient to be transferred to facility by: [facility] Name of family member notified: Kywan, Teutsch (Daughter)  [phone] Patient and family notified of of transfer: 12/01/22  Discharge Plan and Services Additional resources added to the After Visit Summary for                                       Social Determinants of Health (SDOH) Interventions SDOH Screenings   Food Insecurity: Food Insecurity Present (11/29/2022)  Housing: Low Risk  (11/29/2022)  Transportation Needs: Unmet Transportation Needs (11/29/2022)  Utilities: Not At Risk (11/29/2022)  Depression (PHQ2-9): Low Risk  (08/23/2020)  Financial Resource Strain: Medium Risk (12/07/2017)  Stress: Stress Concern Present (12/07/2017)  Tobacco Use: High Risk  (11/29/2022)     Readmission Risk Interventions     No data to display

## 2022-12-01 NOTE — Plan of Care (Signed)

## 2022-12-01 NOTE — TOC Initial Note (Signed)
Transition of Care [facility](TOC) - Initial/Assessment Note    Patient Details  Name: Seth [name] MRN: [id number] Date of Birth: 05/28/1956  Transition of Care [name](TOC) CM/SW Contact:    Erin Sonsyrus P Jahson Emanuele, LCSW Phone Number: 12/01/2022, 1:08 PM  Clinical Narrative:                  CSW met with pt to discuss SNF rec. Pt states he lives at home with his mother and his sister, Deanna ArtisKeisha. Pt states he went to [facility] about 6 months ago. He is agreeable to SNF w/u and would like to go to [facility] again if they can offer bed. Fl2 completed and bed requests sent in hub.   [facility] offer. They can admit pt tomorrow if insurance Berkley Harveyauth is approved.   SNF auth request has been submitted in [employer] portal; Ref [id number] Auth status: pending    Expected Discharge Plan: Skilled Nursing Facility Barriers to Discharge: Insurance Authorization   Patient Goals and CMS Choice Patient states their goals for this hospitalization and ongoing recovery are:: agreeable to SNF, [facility]          Expected Discharge Plan and Services       Living arrangements for the past 2 months: Single Family Home                                      Prior Living Arrangements/Services Living arrangements for the past 2 months: Single Family Home Lives with:: Siblings, Parents Patient language and need for interpreter reviewed:: Yes        Need for Family Participation in Patient Care: No (Comment) Care giver support system in place?: Yes (comment)   Criminal Activity/Legal Involvement Pertinent to Current Situation/Hospitalization: No - Comment as needed  Activities of Daily Living Home Assistive Devices/Equipment: Cane (specify quad or straight), Novoa (specify type), Eyeglasses, Dentures (specify type) (dentures upper and lower) ADL Screening (condition at time of admission) Patient's cognitive ability adequate to safely complete daily activities?: Yes Is the patient deaf or have  difficulty hearing?: No Does the patient have difficulty seeing, even when wearing glasses/contacts?: No Does the patient have difficulty concentrating, remembering, or making decisions?: No Patient able to express need for assistance with ADLs?: Yes Does the patient have difficulty dressing or bathing?: Yes Independently performs ADLs?: No Communication: Independent Dressing (OT): Needs assistance Is this a change from baseline?: Pre-admission baseline Grooming: Independent Feeding: Independent Bathing: Independent Toileting: Independent In/Out Bed: Needs assistance Is this a change from baseline?: Pre-admission baseline Walks in Home: Independent with device (comment) Does the patient have difficulty walking or climbing stairs?: Yes Weakness of Legs: None Weakness of Arms/Hands: Right  Permission Sought/Granted   Permission granted to share information with : Yes, Verbal Permission Granted  Share Information with NAME: Daughter [name]           Emotional Assessment Appearance:: Appears stated age Attitude/Demeanor/Rapport: Engaged Affect (typically observed): Accepting, Appropriate, Calm Orientation: : Oriented to Self, Oriented to Place, Oriented to  Time, Oriented to Situation Alcohol / Substance Use: Alcohol Use Psych Involvement: No (comment)  Admission diagnosis:  Status epilepticus [G40.901] Seizure [R56.9] Patient Active Problem List   Diagnosis Date Noted   Seizure 11/29/2022   Seizure disorder 10/18/2022   Asymptomatic bacteriuria 05/24/2022   HAP (hospital-acquired pneumonia) 05/23/2022   Multiple closed fractures of ribs of right side  Fall during current hospitalization 10/16/2021   Peripheral arterial disease    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, not intractable, with status epilepticus    Pseudoaneurysm of carotid artery    Seizures 05/17/2021   Pressure injury of skin  03/14/2021   Alcohol use 01/01/2021   History of CVA with residual RUE weakness 01/01/2021   COVID-19 virus infection 09/14/2020   Acute osteomyelitis of left first metatarsal stump    Left fibular fracture 02/19/2019   Chronic alcoholism 02/19/2019   Malnutrition of moderate degree 08/25/2017   Tobacco use disorder 08/23/2017   COPD (chronic obstructive pulmonary disease) 08/23/2017   PCP:  Renaye Rakers, MD Pharmacy:   [facility] [address] ([place]), [place] - [address] [place] ([place]) [place] [address] Phone: [phone] Fax: [phone]  [facility] [address] [place] [address] Phone: [phone] Fax: [phone]     Social Determinants of Health (SDOH) Social History: SDOH Screenings   Food Insecurity: Food Insecurity Present (11/29/2022)  Housing: Low Risk  (11/29/2022)  Transportation Needs: Unmet Transportation Needs (11/29/2022)  Utilities: Not At Risk (11/29/2022)  Depression (PHQ2-9): Low Risk  (08/23/2020)  Financial Resource Strain: Medium Risk (12/07/2017)  Stress: Stress Concern Present (12/07/2017)  Tobacco Use: High Risk (11/29/2022)   SDOH Interventions:     Readmission Risk Interventions     No data to display

## 2022-12-01 NOTE — [place] FL2 (Signed)
[place] MEDICAID FL2 LEVEL OF CARE FORM     IDENTIFICATION  Patient Name: Seth [name] Birthdate: 16-Aug-1956 Sex: male Admission Date (Current Location): 11/29/2022  [facility] and [place] Number:  Producer, television/film/video and Address:  [facility], [address], [place], [place] [address]      Provider Number: [id number]  Attending Physician Name and Address:  Lorin Glass, MD  Relative Name and Phone Number:       Current Level of Care: Hospital Recommended Level of Care: Skilled Nursing Facility Prior Approval Number:    Date Approved/Denied:   PASRR Number: [id number]  Discharge Plan: SNF    Current Diagnoses: Patient Active Problem List   Diagnosis Date Noted   Seizure 11/29/2022   Seizure disorder 10/18/2022   Asymptomatic bacteriuria 05/24/2022   HAP (hospital-acquired pneumonia) 05/23/2022   Multiple closed fractures of ribs of right side    Fall during current hospitalization 10/16/2021   Peripheral arterial disease    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, not intractable, with status epilepticus    Pseudoaneurysm of carotid artery    Seizures 05/17/2021   Pressure injury of skin 03/14/2021   Alcohol use 01/01/2021   History of CVA with residual RUE weakness 01/01/2021   COVID-19 virus infection 09/14/2020   Acute osteomyelitis of left first metatarsal stump    Left fibular fracture 02/19/2019   Chronic alcoholism 02/19/2019   Malnutrition of moderate degree 08/25/2017   Tobacco use disorder 08/23/2017   COPD (chronic obstructive pulmonary disease) 08/23/2017    Orientation RESPIRATION BLADDER Height & Weight     Self, Situation, Place, Time  Normal Continent, External catheter Weight: 162 lb 7.7 oz (73.7 kg) Height:  6\' 3"  (190.5 cm)  BEHAVIORAL SYMPTOMS/MOOD NEUROLOGICAL BOWEL NUTRITION STATUS      Continent Diet (see summary)  AMBULATORY STATUS  COMMUNICATION OF NEEDS Skin   Limited Assist Verbally Skin abrasions (leg, bilateral)                       Personal Care Assistance Level of Assistance  Bathing, Feeding, Dressing Bathing Assistance: Limited assistance Feeding assistance: Independent Dressing Assistance: Limited assistance     Functional Limitations Info  Sight Sight Info: Impaired        SPECIAL CARE FACTORS FREQUENCY  PT (By licensed PT), OT (By licensed OT)     PT Frequency: 5x/week OT Frequency: 5x/week            Contractures Contractures Info: Not present    Additional Factors Info  Code Status, Allergies, Psychotropic Code Status Info: FULL Allergies Info: No Known Allergies Psychotropic Info: clonazePAM (KLONOPIN) tablet 1 mg, sertraline (ZOLOFT) tablet 25 mg         Current Medications (12/01/2022):  This is the current hospital active medication list Current Facility-Administered Medications  Medication Dose Route Frequency Provider Last Rate Last Admin   acetaminophen (TYLENOL) tablet 650 mg  650 mg Oral Q6H PRN Emeline General, MD       aspirin EC tablet 81 mg  81 mg Oral Daily Mikey College T, MD   81 mg at 12/01/22 9977   clonazePAM (KLONOPIN) tablet 1 mg  1 mg Oral BID Charlsie Quest, MD   1 mg at 12/01/22 4142   clopidogrel (PLAVIX) tablet 75 mg  75 mg Oral Daily Mikey College T, MD   75 mg at 12/01/22 0943   cyanocobalamin (VITAMIN  B12) tablet 1,000 mcg  1,000 mcg Oral Georgia DuffQODAY Zhang, Ping T, MD   1,000 mcg at 12/01/22 0937   enoxaparin (LOVENOX) injection 40 mg  40 mg Subcutaneous Q24H Mikey CollegeZhang, Ping T, MD   40 mg at 11/30/22 1429   feeding supplement (ENSURE ENLIVE / ENSURE PLUS) liquid 237 mL  237 mL Oral TID BM Mikey CollegeZhang, Ping T, MD   237 mL at 12/01/22 0939   folic acid (FOLVITE) tablet 1 mg  1 mg Oral Daily Charlsie QuestYadav, Priyanka O, MD   1 mg at 12/01/22 [id number]   gabapentin (NEURONTIN) capsule 300 mg  300 mg Oral TID Mikey CollegeZhang, Ping T, MD   300 mg at 12/01/22 [id number]   lacosamide (VIMPAT) tablet 150  mg  150 mg Oral BID Charlsie QuestYadav, Priyanka O, MD   150 mg at 12/01/22 0937   levETIRAcetam (KEPPRA) tablet 1,500 mg  1,500 mg Oral BID Charlsie QuestYadav, Priyanka O, MD   1,500 mg at 12/01/22 [id number]   LORazepam (ATIVAN) tablet 1-4 mg  1-4 mg Oral Q1H PRN Emeline GeneralZhang, Ping T, MD       Or   LORazepam (ATIVAN) injection 1-4 mg  1-4 mg Intravenous Q1H PRN Mikey CollegeZhang, Ping T, MD       metoprolol tartrate (LOPRESSOR) tablet 50 mg  50 mg Oral BID Mikey CollegeZhang, Ping T, MD   50 mg at 12/01/22 [id number]   midazolam (VERSED) injection 2 mg  2 mg Intravenous Q4H PRN Charlsie QuestYadav, Priyanka O, MD       mometasone-formoterol (DULERA) 200-5 MCG/ACT inhaler 2 puff  2 puff Inhalation BID Mikey CollegeZhang, Ping T, MD   2 puff at 12/01/22 0813   multivitamin with minerals tablet 1 tablet  1 tablet Oral Daily Emeline GeneralZhang, Ping T, MD   1 tablet at 12/01/22 [id number]   Oral care mouth rinse  15 mL Mouth Rinse PRN Dahal, Melina SchoolsBinaya, MD       pantoprazole (PROTONIX) EC tablet 40 mg  40 mg Oral Daily Mikey CollegeZhang, Ping T, MD   40 mg at 12/01/22 0939   PHENobarbital (LUMINAL) tablet 64.8 mg  64.8 mg Oral BID Charlsie QuestYadav, Priyanka O, MD   64.8 mg at 12/01/22 [id number]   rosuvastatin (CRESTOR) tablet 10 mg  10 mg Oral Daily Mikey CollegeZhang, Ping T, MD   10 mg at 12/01/22 [id number]   sertraline (ZOLOFT) tablet 25 mg  25 mg Oral Daily Mikey CollegeZhang, Ping T, MD   25 mg at 12/01/22 [id number]   thiamine (VITAMIN B1) tablet 100 mg  100 mg Oral Daily Charlsie QuestYadav, Priyanka O, MD   100 mg at 12/01/22 [id number]   Or   thiamine (VITAMIN B1) injection 100 mg  100 mg Intravenous Daily Charlsie QuestYadav, Priyanka O, MD       umeclidinium bromide (INCRUSE ELLIPTA) 62.5 MCG/ACT 1 puff  1 puff Inhalation Daily Emeline GeneralZhang, Ping T, MD   1 puff at 12/01/22 [id number]     Discharge Medications: Please see discharge summary for a list of discharge medications.  Relevant Imaging Results:  Relevant Lab Results:   Additional Information SSN [id number]  Lyann Hagstrom A [place], [place]WA

## 2022-12-01 NOTE — Progress Notes (Signed)
PROGRESS NOTE  Elwyn Reach  DOB: 1956-01-19  PCP: Renaye Rakers, MD AYO:[id number]  DOA: 11/29/2022  LOS: 1 day  Hospital Day: 3  Brief narrative: Seth [name] is a 67 y.o. male with PMH significant for seizure disorder on Vimpat, Keppra and phenobarbital, alcohol abuse, stroke with residual right lower extremity weakness, HTN, HLD, anxiety/depression 4/3, patient presented to ED at [facility] with complaint of right-sided weakness.  In the ED, he was noted to have right arm twitching.  He was last seen inpatient by neurology 2 months ago when he was admitted for breakthrough seizure with Todd's paralysis.  He was discharged on Keppra 1500 mg twice daily, Vimpat 100 mg twice daily and phenobarb 32.4 mg every morning and 64.8 mg nightly.   He was seen by neurology in the ED.  On exam, he was noted to have mild lethargy, decreased level of alertness and occasional subtle right upper extremity twitching, subtle right facial droop and mild dysarthria.  MRI brain showed stable findings without any acute intracranial pathology. Overall clinical picture was most consistent with focal status epilepticus.  Stat EEG showed continuous high amplitude rhythmic left hemisphere activity appearing most consistent with electrographic status epilepticus.  He was started on IV Keppra and IV Ativan Medication noncompliance was also suspected but patient claimed that he has been compliant with his seizure medications and his last drink was the evening prior.   Per neurology recommendation, patient was transferred to [facility] for long-term EEG monitoring.  Subjective: Patient was seen and examined this morning.  Pleasant elderly African-American male.  Cheerful.  Propped up in bed.  Awake. Seen by PT.  SNF recommended.  Assessment and plan: Status epilepticus with Todd's paresis History of seizure disorder Unclear compliance to AEDs Presented with right-sided weakness, mild lethargy  Stroke ruled out  by MRI. Seen by neurology Stat EEG showed status epilepticus Sent to [facility] for long-term EEG monitoring.   No seizures noted in long-term EEG monitoring.   Neurologist recommended to continue Keppra 50 mg twice daily and increase the dose of Vimpat to 150 mg twice daily and phenobarbital to 65 mg twice daily.  Clonazepam dose was also increased to 1 mg twice daily Seizure precautions in place.  Chronic alcoholism No acute symptoms signs of active withdrawal Continue thiamine folate and multivitamin   History of COPD No acute symptoms of exacerbation Continue LABA and ICS   History of CVA Stroke ruled out.   Continue Plavix aspirin and Crestor   HTN, uncontrolled Continue metoprolol  Hypoglycemia Blood sugar level was low when patient was NPO. A1c 5 in 2022 With improvement in appetite, blood sugar level has improved.  Impaired mobility Discussed with patient's daughter Ms. [name].  PT eval was obtained.  SNF recommended  Goals of care   Code Status: Full Code     DVT prophylaxis:  enoxaparin (LOVENOX) injection 40 mg Start: 11/29/22 1430   Antimicrobials: none Fluid: Can stop IV fluid. Consultants: Neurology Family Communication: Discussed with patient's daughter  Status: Observation Level of care:  Progressive   Needs to continue in-hospital care:  Pending SNF  Patient from: Home Anticipated d/c to: SNF   Diet:  Diet Order             Diet Heart Fluid consistency: Thin  Diet effective now                   Scheduled Meds:  aspirin EC  81 mg Oral Daily  clonazePAM  1 mg Oral BID   clopidogrel  75 mg Oral Daily   cyanocobalamin  1,000 mcg Oral QODAY   enoxaparin (LOVENOX) injection  40 mg Subcutaneous Q24H   feeding supplement  237 mL Oral TID BM   folic acid  1 mg Oral Daily   gabapentin  300 mg Oral TID   lacosamide  150 mg Oral BID   levETIRAcetam  1,500 mg Oral BID   metoprolol tartrate  50 mg Oral BID   mometasone-formoterol   2 puff Inhalation BID   multivitamin with minerals  1 tablet Oral Daily   pantoprazole  40 mg Oral Daily   PHENobarbital  64.8 mg Oral BID   rosuvastatin  10 mg Oral Daily   sertraline  25 mg Oral Daily   thiamine  100 mg Oral Daily   Or   thiamine  100 mg Intravenous Daily   umeclidinium bromide  1 puff Inhalation Daily    PRN meds: acetaminophen, LORazepam **OR** LORazepam, midazolam, mouth rinse   Infusions:     Antimicrobials: Anti-infectives (From admission, onward)    None       Nutritional status:  Body mass index is 20.31 kg/m.          Objective: Vitals:   12/01/22 0740 12/01/22 1136  BP: (!) 157/87 (!) 143/91  Pulse: 73 72  Resp: 18 (!) 22  Temp: 98.4 F (36.9 C) 98.5 F (36.9 C)  SpO2: 95% 97%    Intake/Output Summary (Last 24 hours) at 12/01/2022 1147 Last data filed at 12/01/2022 0800 Gross per 24 hour  Intake 480 ml  Output 1700 ml  Net -1220 ml   Filed Weights   11/29/22 0256  Weight: 73.7 kg   Weight change:  Body mass index is 20.31 kg/m.   Physical Exam: General exam: Pleasant, elderly African-American male.  Propped up in bed.  Cheerful.  Not in pain not in distress Skin: No rashes, lesions or ulcers. HEENT: Atraumatic, normocephalic, no obvious bleeding Lungs: Clear to auscultation bilateral CVS: Regular rate and rhythm, no murmur GI/Abd soft, not tender, nondistended, bowel sound present CNS: Alert, awake, cheerful.  Oriented to place and person.  Has deficits from previous stroke Psychiatry: Mood appropriate. Extremities: No pedal edema, no calf tenderness  Data Review: I have personally reviewed the laboratory data and studies available.  F/u labs  Unresulted Labs (From admission, onward)    None       Total time spent in review of labs and imaging, patient evaluation, formulation of plan, documentation and communication with family: 45 minutes  Signed, Lorin GlassBinaya Anjeanette Petzold, MD [facility] 12/01/2022

## 2022-12-01 NOTE — Progress Notes (Signed)
Report called to [facility], SBAR followed, questions asked answered. Precious received report.

## 2022-12-01 NOTE — Discharge Summary (Signed)
Physician Discharge Summary  Elwyn ReachRodney B Tabbert WUJ:[id number] DOB: 04/07/1956 DOA: 11/29/2022  PCP: Renaye RakersBland, Veita, MD  Admit date: 11/29/2022 Discharge date: 12/01/2022  Admitted From: Home Discharge disposition: SNF  Recommendations at discharge:  Ensure compliance with AEDs. Stop alcohol  Brief narrative: Elwyn ReachRodney B [name] is a 67 y.o. male with PMH significant for seizure disorder on Vimpat, Keppra and phenobarbital, alcohol abuse, stroke with residual right lower extremity weakness, HTN, HLD, anxiety/depression 4/3, patient presented to ED at [facility] with complaint of right-sided weakness.  In the ED, he was noted to have right arm twitching.  He was last seen inpatient by neurology 2 months ago when he was admitted for breakthrough seizure with Todd's paralysis.  He was discharged on Keppra 1500 mg twice daily, Vimpat 100 mg twice daily and phenobarb 32.4 mg every morning and 64.8 mg nightly.   He was seen by neurology in the ED.  On exam, he was noted to have mild lethargy, decreased level of alertness and occasional subtle right upper extremity twitching, subtle right facial droop and mild dysarthria.  MRI brain showed stable findings without any acute intracranial pathology. Overall clinical picture was most consistent with focal status epilepticus.  Stat EEG showed continuous high amplitude rhythmic left hemisphere activity appearing most consistent with electrographic status epilepticus.  He was started on IV Keppra and IV Ativan Medication noncompliance was also suspected but patient claimed that he has been compliant with his seizure medications and his last drink was the evening prior.   Per neurology recommendation, patient was transferred to [facility] for long-term EEG monitoring.  Subjective: Patient was seen and examined this morning.  Pleasant elderly African-American male.  Cheerful.  Propped up in bed.  Awake. Seen by PT.  SNF recommended.  Hospital course: Status  epilepticus with Todd's paresis History of seizure disorder Unclear compliance to AEDs Presented with right-sided weakness, mild lethargy  Stroke ruled out by MRI. Seen by neurology Stat EEG showed status epilepticus Sent to [facility] for long-term EEG monitoring.   No seizures noted in long-term EEG monitoring.   Neurologist recommended to continue Keppra 50 mg twice daily and increase the dose of Vimpat to 150 mg twice daily and phenobarbital to 65 mg twice daily.  Clonazepam dose was also increased to 1 mg twice daily Seizure precautions in place.  Chronic alcoholism No acute symptoms signs of active withdrawal Continue thiamine folate and multivitamin   History of COPD No acute symptoms of exacerbation Continue LABA and ICS   History of CVA Stroke ruled out.   Continue Plavix aspirin and Crestor   HTN, uncontrolled Continue metoprolol  Hypoglycemia Blood sugar level was low when patient was NPO. A1c 5 in 2022 With improvement in appetite, blood sugar level has improved.  Impaired mobility Discussed with patient's daughter Ms. Seth [name].  PT eval was obtained.  SNF recommended  Goals of care   Code Status: Full Code   Wounds:  -    Discharge Exam:   Vitals:   11/30/22 2300 12/01/22 0321 12/01/22 0740 12/01/22 1136  BP: 135/75 133/82 (!) 157/87 (!) 143/91  Pulse: 66 (!) 56 73 72  Resp: 19 13 18  (!) 22  Temp: 98.5 F (36.9 C)  98.4 F (36.9 C) 98.5 F (36.9 C)  TempSrc: Axillary Axillary Oral Oral  SpO2: 98% 100% 95% 97%  Weight:      Height:        Body mass index is 20.31 kg/m.   General exam: Pleasant,  elderly African-American male.  Propped up in bed.  Cheerful.  Not in pain not in distress Skin: No rashes, lesions or ulcers. HEENT: Atraumatic, normocephalic, no obvious bleeding Lungs: Clear to auscultation bilateral CVS: Regular rate and rhythm, no murmur GI/Abd soft, not tender, nondistended, bowel sound present CNS: Alert, awake, cheerful.   Oriented to place and person.  Has deficits from previous stroke Psychiatry: Mood appropriate. Extremities: No pedal edema, no calf tenderness  Follow ups:    Follow-up Information     Renaye Rakers, MD Follow up.   Specialty: Family Medicine Contact information: [address] [place] [address] [phone]                 Discharge Instructions:   Discharge Instructions     Ambulatory referral to Neurology   Complete by: As directed    An appointment is requested in approximately: 4 weeks   Call MD for:  difficulty breathing, headache or visual disturbances   Complete by: As directed    Call MD for:  extreme fatigue   Complete by: As directed    Call MD for:  hives   Complete by: As directed    Call MD for:  persistant dizziness or light-headedness   Complete by: As directed    Call MD for:  persistant nausea and vomiting   Complete by: As directed    Call MD for:  severe uncontrolled pain   Complete by: As directed    Call MD for:  temperature >100.4   Complete by: As directed    Diet general   Complete by: As directed    Discharge instructions   Complete by: As directed    Recommendations at discharge:   Ensure compliance with AEDs.  Stop alcohol  General discharge instructions: Follow with Primary MD Renaye Rakers, MD in 7 days  Please request your PCP  to go over your hospital tests, procedures, radiology results at the follow up. Please get your medicines reviewed and adjusted.  Your PCP may decide to repeat certain labs or tests as needed. Do not drive, operate heavy machinery, perform activities at heights, swimming or participation in water activities or provide baby sitting services if your were admitted for syncope or siezures until you have seen by Primary MD or a Neurologist and advised to do so again. [place] Controlled Substance Reporting System database was reviewed. Do not drive, operate heavy machinery, perform activities at  heights, swim, participate in water activities or provide baby-sitting services while on medications for pain, sleep and mood until your outpatient physician has reevaluated you and advised to do so again.  You are strongly recommended to comply with the dose, frequency and duration of prescribed medications. Activity: As tolerated with Full fall precautions use Herder/cane & assistance as needed Avoid using any recreational substances like cigarette, tobacco, alcohol, or non-prescribed drug. If you experience worsening of your admission symptoms, develop shortness of breath, life threatening emergency, suicidal or homicidal thoughts you must seek medical attention immediately by calling 911 or calling your MD immediately  if symptoms less severe. You must read complete instructions/literature along with all the possible adverse reactions/side effects for all the medicines you take and that have been prescribed to you. Take any new medicine only after you have completely understood and accepted all the possible adverse reactions/side effects.  Wear Seat belts while driving. You were cared for by a hospitalist during your hospital stay. If you have any questions about  your discharge medications or the care you received while you were in the hospital after you are discharged, you can call the unit and ask to speak with the hospitalist or the covering physician. Once you are discharged, your primary care physician will handle any further medical issues. Please note that NO REFILLS for any discharge medications will be authorized once you are discharged, as it is imperative that you return to your primary care physician (or establish a relationship with a primary care physician if you do not have one).   Increase activity slowly   Complete by: As directed        Discharge Medications:   Allergies as of 12/01/2022   No Known Allergies      Medication List     TAKE these medications    acetaminophen  325 MG tablet Commonly known as: TYLENOL Take 2 tablets (650 mg total) by mouth every 6 (six) hours as needed for headache or mild pain.   clonazePAM 1 MG tablet Commonly known as: KLONOPIN Take 1 tablet (1 mg total) by mouth 2 (two) times daily.   clopidogrel 75 MG tablet Commonly known as: PLAVIX Take 1 tablet (75 mg total) by mouth daily.   cyanocobalamin 1000 MCG tablet Commonly known as: VITAMIN B12 Take 1,000 mcg by mouth every other day.   Daily Vitamin Formula+Minerals Tabs Take 1 tablet by mouth daily.   EQ Aspirin Adult Low Dose 81 MG tablet Generic drug: aspirin EC Take 81 mg by mouth daily.   feeding supplement Liqd Take 237 mLs by mouth 3 (three) times daily between meals.   FeroSul 325 (65 FE) MG tablet Generic drug: ferrous sulfate Take 325 mg by mouth daily.   fluticasone-salmeterol 500-50 MCG/ACT Aepb Commonly known as: ADVAIR Inhale 1 puff into the lungs 2 (two) times daily.   folic acid 1 MG tablet Commonly known as: FOLVITE Take 1 mg by mouth daily.   gabapentin 300 MG capsule Commonly known as: NEURONTIN Take 300 mg by mouth 3 (three) times daily.   Incruse Ellipta 62.5 MCG/ACT Aepb Generic drug: umeclidinium bromide INHALE 1 PUFF INTO THE LUNGS DAILY What changed: when to take this   Lacosamide 150 MG Tabs Take 1 tablet (150 mg total) by mouth 2 (two) times daily. What changed:  medication strength how much to take   levETIRAcetam 750 MG tablet Commonly known as: KEPPRA Take 2 tablets (1,500 mg total) by mouth 2 (two) times daily.   metoprolol tartrate 50 MG tablet Commonly known as: LOPRESSOR Take 1 tablet (50 mg total) by mouth 2 (two) times daily.   omeprazole 40 MG capsule Commonly known as: PRILOSEC Take 40 mg by mouth daily.   PHENobarbital 64.8 MG tablet Commonly known as: LUMINAL Take 1 tablet (64.8 mg total) by mouth 2 (two) times daily. What changed:  when to take this Another medication with the same name was  removed. Continue taking this medication, and follow the directions you see here.   rosuvastatin 10 MG tablet Commonly known as: CRESTOR Take 1 tablet (10 mg total) by mouth daily.   sertraline 25 MG tablet Commonly known as: ZOLOFT Take 25 mg by mouth daily.   thiamine 100 MG tablet Commonly known as: VITAMIN B1 Take 100 mg by mouth daily.   Zinc Sulfate 220 (50 Zn) MG Tabs Take 1 tablet by mouth daily.         The results of significant diagnostics from this hospitalization (including imaging, microbiology, ancillary and laboratory) are  listed below for reference.    Procedures and Diagnostic Studies:   MR BRAIN WO CONTRAST  Result Date: 11/29/2022 CLINICAL DATA:  Right-sided weakness worse than baseline. EXAM: MRI HEAD WITHOUT CONTRAST TECHNIQUE: Multiplanar, multiecho pulse sequences of the brain and surrounding structures were obtained without intravenous contrast. COMPARISON:  Same-day noncontrast CT head FINDINGS: Brain: There is no acute intracranial hemorrhage, extra-axial fluid collection, or acute infarct There is parenchymal volume loss with enlargement of the ventricular system and extra-axial CSF spaces, accelerated for age. There is superimposed disproportionate atrophy of the left hippocampus/mesial temporal lobe. The ventricles are stable in size, without evidence of transependymal flow of CSF. Small remote infarcts are seen in the left parietal white matter and bilateral cerebellar hemispheres. Additional FLAIR signal abnormality in the supratentorial white matter likely reflects underlying chronic small-vessel ischemic change. Hemosiderin staining is again noted over the left cerebral convexity suggesting prior hemorrhage. The pituitary and suprasellar region are normal. 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 There is no other mass lesion. There is no mass effect or midline shift. Vascular: Cranial flow  voids are normal. 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 Skull and upper cervical spine: Normal marrow signal. Sinuses/Orbits: There is layering fluid in the left maxillary sinus. The globes and orbits are unremarkable. Other: None. IMPRESSION: 1. Stable appearance of the brain with no acute intracranial pathology. 2. Unchanged age accelerated parenchymal volume loss and chronic small-vessel ischemic change. 3. Layering fluid in the left maxillary sinus which can be seen with acute sinusitis in the correct clinical setting. Electronically Signed   By: Lesia Hausen M.D.   On: 11/29/2022 12:24   EEG adult  Result Date: 11/29/2022 Charlsie Quest, MD     11/29/2022  7:51 AM Patient Name: Seth [name] MRN: [id number] Epilepsy Attending: Charlsie Quest Referring Physician/Provider: Caryl Pina, MD Date: 11/29/2022 Duration: 22.30 mins Patient history: 67 year old male presenting with recurrent focal status epilepticus. EEG to evaluate for seizure Level of alertness: awake AEDs during EEG study: LEV, LCM, Phenobarb Technical aspects: This EEG study was done with scalp electrodes positioned according to the 10-20 International system of electrode placement. Electrical activity was reviewed with band pass filter of 1-70Hz , sensitivity of 7 uV/mm, display speed of 52mm/sec with a 60Hz  notched filter applied as appropriate. EEG data were recorded continuously and digitally stored.  Video monitoring was available and reviewed as appropriate. Description: The posterior dominant rhythm consists of 8-9 Hz activity of moderate voltage (25-35 uV) seen predominantly in posterior head regions, symmetric and reactive to eye opening and eye closing. EEG showed continuous 3 to 6 Hz theta-delta slowing in left hemisphere. Lateralized periodic discharges were noted in left hemisphere with evolution in  morphology and frequency. Clinically, patient was less responsive, occasionally starting off. Per chart review at times there is right upper extremity twitching as well. This EEG pattern is consistent with electrographic status epilepticus. Hyperventilation and photic stimulation were not performed.   ABNORMALITY - Focal status epilepticus, left hemisphere - Continuous slow, left hemisphere IMPRESSION: This study showed convulsive status epilepticus arising from left hemisphere as well as cortical dysfunction in left hemisphere likely due to seizures, underlying structural abnormality.  Priyanka [name]   CT HEAD WO CONTRAST ( )  Result Date: 11/29/2022 CLINICAL DATA:  Seizure with head trauma. Fell at home.  Right-sided weakness. EXAM: CT HEAD WITHOUT CONTRAST TECHNIQUE: Contiguous axial images were obtained from the base of the skull through the vertex without intravenous contrast. RADIATION DOSE REDUCTION: This exam was performed according to the departmental dose-optimization program which includes automated exposure control, adjustment of the mA and/or kV according to patient size and/or use of iterative reconstruction technique. COMPARISON:  CT 10/30/2022, MRI brain 10/18/2022 FINDINGS: Brain: There is moderate age advanced atrophy, with atrophic ventriculomegaly and moderately developed small-vessel disease of the cerebral white matter. There are multiple chronic lacunar infarcts in both cerebellar hemispheres. Again noted is a 1.2 cm noncalcified right parafalcine meningioma in the superior right frontal area best seen on 3:25 and 6:31. No new asymmetry is seen concerning for an acute infarct, hemorrhage or mass. There is no midline shift. Mild asymmetric prominence of the left lateral ventricle is again shown. Vascular: The carotid siphons are heavily calcified. There is calcification and the V4 left vertebral artery. No hyperdense central vessel prove Skull: Negative for fractures or focal lesions.  There is no visible scalp hematoma. Sinuses/Orbits: There is patchy opacification of the bilateral ethmoid air cells. There is mild membrane thickening in the maxillary sinuses, small retention cyst or polyp with 1 in the right maxillary sinus and another in the right sphenoid air cell. Trace fluid has developed in the left maxillary sinus. Other sinuses, bilateral mastoid air cells, and middle ears are clear. Negative orbits. There is a reverse S shaped nasal septum. Unilateral pneumatization left petrous apex. Other: None. IMPRESSION: 1. No acute intracranial CT findings or depressed skull fractures. 2. 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 3. 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 4. 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 5. Sinus disease with trace fluid in the left maxillary sinus. Electronically Signed   By: Almira BarKeith  Chesser M.D.   On: 11/29/2022 05:22     Labs:   Basic Metabolic Panel: Recent Labs  Lab 11/29/22 0315 11/29/22 0322  NA 138 139  K 3.6 3.7  CL 105 105  CO2 22  --   GLUCOSE 73 69*  BUN 8 9  CREATININE 0.87 0.70  CALCIUM 8.4*  --    GFR Estimated Creatinine Clearance: 96 mL/min (by C-G formula based on SCr of 0.7 mg/dL). Liver Function Tests: Recent Labs  Lab 11/29/22 0315  AST 25  ALT 14  ALKPHOS 163*  BILITOT 0.7  PROT 6.1*  ALBUMIN 2.9*   No results for input(s): "LIPASE", "AMYLASE" in the last 168 hours. No results for input(s): "AMMONIA" in the last 168 hours. Coagulation profile No results for input(s): "INR", "PROTIME" in the last 168 hours.  CBC: Recent Labs  Lab 11/29/22 0315 11/29/22 0322  WBC 6.0  --   HGB 11.5* 12.6*  HCT 34.8* 37.0*  MCV 90.9  --   PLT 247  --    Cardiac Enzymes: No results for input(s): "CKTOTAL", "CKMB", "CKMBINDEX", "TROPONINI" in the last 168 hours. BNP: Invalid input(s): "POCBNP" CBG: Recent Labs  Lab 11/30/22 0943  GLUCAP 99   D-Dimer No results for input(s):  "DDIMER" in the last 72 hours. Hgb A1c No results for input(s): "HGBA1C" in the last 72 hours. Lipid Profile No results for input(s): "CHOL", "HDL", "LDLCALC", "TRIG", "CHOLHDL", "LDLDIRECT" in the last 72 hours. Thyroid function studies No results for input(s): "TSH", "T4TOTAL", "T3FREE", "THYROIDAB" in the last 72 hours.  Invalid input(s): "FREET3" Anemia work up No results for input(s): "VITAMINB12", "FOLATE", "FERRITIN", "TIBC", "IRON", "RETICCTPCT" in the last  72 hours. Microbiology No results found for this or any previous visit (from the past 240 hour(s)).  Time coordinating discharge: 45 minutes  Signed: Ande Therrell  [facility] 12/01/2022, 2:17 PM

## 2022-12-18 ENCOUNTER — Telehealth: Payer: Self-pay

## 2022-12-18 NOTE — Telephone Encounter (Signed)
(  4:11 pm) PC SW scheduled an initial Empassion/palliative care visit with patient. The initial F2F visit is scheduled for 12/20/22 @ 11:30 am.

## 2022-12-19 ENCOUNTER — Emergency Department (HOSPITAL_COMMUNITY): Payer: Medicare Other

## 2022-12-19 ENCOUNTER — Inpatient Hospital Stay (HOSPITAL_COMMUNITY)
Admission: EM | Admit: 2022-12-19 | Discharge: 2022-12-24 | DRG: 100 | Disposition: A | Discharge status: Home Health | Payer: Medicare Other | Attending: Family Medicine | Admitting: Family Medicine

## 2022-12-19 ENCOUNTER — Other Ambulatory Visit: Payer: Self-pay

## 2022-12-19 DIAGNOSIS — J449 Chronic obstructive pulmonary disease, unspecified: Secondary | ICD-10-CM | POA: Diagnosis present

## 2022-12-19 DIAGNOSIS — Z8679 Personal history of other diseases of the circulatory system: Secondary | ICD-10-CM

## 2022-12-19 DIAGNOSIS — R531 Weakness: Secondary | ICD-10-CM

## 2022-12-19 DIAGNOSIS — Z9181 History of falling: Secondary | ICD-10-CM

## 2022-12-19 DIAGNOSIS — M109 Gout, unspecified: Secondary | ICD-10-CM | POA: Diagnosis present

## 2022-12-19 DIAGNOSIS — R569 Unspecified convulsions: Secondary | ICD-10-CM | POA: Diagnosis not present

## 2022-12-19 DIAGNOSIS — Z7902 Long term (current) use of antithrombotics/antiplatelets: Secondary | ICD-10-CM

## 2022-12-19 DIAGNOSIS — I110 Hypertensive heart disease with heart failure: Secondary | ICD-10-CM | POA: Diagnosis present

## 2022-12-19 DIAGNOSIS — Z7951 Long term (current) use of inhaled steroids: Secondary | ICD-10-CM

## 2022-12-19 DIAGNOSIS — I5032 Chronic diastolic (congestive) heart failure: Secondary | ICD-10-CM | POA: Diagnosis present

## 2022-12-19 DIAGNOSIS — Z89432 Acquired absence of left foot: Secondary | ICD-10-CM

## 2022-12-19 DIAGNOSIS — F17210 Nicotine dependence, cigarettes, uncomplicated: Secondary | ICD-10-CM | POA: Diagnosis present

## 2022-12-19 DIAGNOSIS — G312 Degeneration of nervous system due to alcohol: Secondary | ICD-10-CM | POA: Diagnosis present

## 2022-12-19 DIAGNOSIS — K219 Gastro-esophageal reflux disease without esophagitis: Secondary | ICD-10-CM | POA: Diagnosis present

## 2022-12-19 DIAGNOSIS — E785 Hyperlipidemia, unspecified: Secondary | ICD-10-CM | POA: Diagnosis present

## 2022-12-19 DIAGNOSIS — Z7982 Long term (current) use of aspirin: Secondary | ICD-10-CM

## 2022-12-19 DIAGNOSIS — I440 Atrioventricular block, first degree: Secondary | ICD-10-CM | POA: Diagnosis present

## 2022-12-19 DIAGNOSIS — G8384 Todd's paralysis (postepileptic): Secondary | ICD-10-CM | POA: Diagnosis present

## 2022-12-19 DIAGNOSIS — I69351 Hemiplegia and hemiparesis following cerebral infarction affecting right dominant side: Secondary | ICD-10-CM

## 2022-12-19 DIAGNOSIS — Z8249 Family history of ischemic heart disease and other diseases of the circulatory system: Secondary | ICD-10-CM

## 2022-12-19 DIAGNOSIS — Z8619 Personal history of other infectious and parasitic diseases: Secondary | ICD-10-CM

## 2022-12-19 DIAGNOSIS — G40109 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, not intractable, without status epilepticus: Secondary | ICD-10-CM | POA: Diagnosis not present

## 2022-12-19 DIAGNOSIS — E1140 Type 2 diabetes mellitus with diabetic neuropathy, unspecified: Secondary | ICD-10-CM | POA: Diagnosis present

## 2022-12-19 DIAGNOSIS — G928 Other toxic encephalopathy: Secondary | ICD-10-CM | POA: Diagnosis present

## 2022-12-19 DIAGNOSIS — R29818 Other symptoms and signs involving the nervous system: Principal | ICD-10-CM

## 2022-12-19 DIAGNOSIS — E162 Hypoglycemia, unspecified: Secondary | ICD-10-CM | POA: Diagnosis present

## 2022-12-19 DIAGNOSIS — G40909 Epilepsy, unspecified, not intractable, without status epilepticus: Secondary | ICD-10-CM

## 2022-12-19 DIAGNOSIS — D320 Benign neoplasm of cerebral meninges: Secondary | ICD-10-CM | POA: Diagnosis present

## 2022-12-19 DIAGNOSIS — Z981 Arthrodesis status: Secondary | ICD-10-CM

## 2022-12-19 DIAGNOSIS — Y908 Blood alcohol level of 240 mg/100 ml or more: Secondary | ICD-10-CM | POA: Diagnosis present

## 2022-12-19 DIAGNOSIS — Z91148 Patient's other noncompliance with medication regimen for other reason: Secondary | ICD-10-CM

## 2022-12-19 DIAGNOSIS — I714 Abdominal aortic aneurysm, without rupture, unspecified: Secondary | ICD-10-CM | POA: Diagnosis present

## 2022-12-19 DIAGNOSIS — R2990 Unspecified symptoms and signs involving the nervous system: Secondary | ICD-10-CM

## 2022-12-19 DIAGNOSIS — F32A Depression, unspecified: Secondary | ICD-10-CM | POA: Diagnosis present

## 2022-12-19 DIAGNOSIS — F10129 Alcohol abuse with intoxication, unspecified: Secondary | ICD-10-CM | POA: Diagnosis present

## 2022-12-19 DIAGNOSIS — F1090 Alcohol use, unspecified, uncomplicated: Secondary | ICD-10-CM | POA: Diagnosis present

## 2022-12-19 DIAGNOSIS — F17200 Nicotine dependence, unspecified, uncomplicated: Secondary | ICD-10-CM | POA: Diagnosis present

## 2022-12-19 DIAGNOSIS — F10930 Alcohol use, unspecified with withdrawal, uncomplicated: Secondary | ICD-10-CM

## 2022-12-19 DIAGNOSIS — Z79899 Other long term (current) drug therapy: Secondary | ICD-10-CM

## 2022-12-19 DIAGNOSIS — N400 Enlarged prostate without lower urinary tract symptoms: Secondary | ICD-10-CM | POA: Diagnosis present

## 2022-12-19 DIAGNOSIS — I10 Essential (primary) hypertension: Secondary | ICD-10-CM | POA: Diagnosis present

## 2022-12-19 DIAGNOSIS — E11649 Type 2 diabetes mellitus with hypoglycemia without coma: Secondary | ICD-10-CM | POA: Diagnosis present

## 2022-12-19 HISTORY — DX: Chronic obstructive pulmonary disease, unspecified: J44.9

## 2022-12-19 LAB — CBC
HCT: 42.4 % (ref 39.0–52.0)
Hemoglobin: 12.8 g/dL — ABNORMAL LOW (ref 13.0–17.0)
MCH: 30.1 pg (ref 26.0–34.0)
MCHC: 30.2 g/dL (ref 30.0–36.0)
MCV: 99.8 fL (ref 80.0–100.0)
Platelets: 251 10*3/uL (ref 150–400)
RBC: 4.25 MIL/uL (ref 4.22–5.81)
RDW: 16.4 % — ABNORMAL HIGH (ref 11.5–15.5)
WBC: 5.6 10*3/uL (ref 4.0–10.5)
nRBC: 0 % (ref 0.0–0.2)

## 2022-12-19 LAB — COMPREHENSIVE METABOLIC PANEL
ALT: 14 U/L (ref 0–44)
AST: 20 U/L (ref 15–41)
Albumin: 3.6 g/dL (ref 3.5–5.0)
Alkaline Phosphatase: 173 U/L — ABNORMAL HIGH (ref 38–126)
Anion gap: 17 — ABNORMAL HIGH (ref 5–15)
BUN: 11 mg/dL (ref 8–23)
CO2: 17 mmol/L — ABNORMAL LOW (ref 22–32)
Calcium: 9.3 mg/dL (ref 8.9–10.3)
Chloride: 104 mmol/L (ref 98–111)
Creatinine, Ser: 0.87 mg/dL (ref 0.61–1.24)
GFR, Estimated: >60 mL/min (ref >60)
Glucose, Bld: 70 mg/dL (ref 70–99)
Potassium: 4.4 mmol/L (ref 3.5–5.1)
Sodium: 138 mmol/L (ref 135–145)
Total Bilirubin: 0.6 mg/dL (ref 0.3–1.2)
Total Protein: 7.4 g/dL (ref 6.5–8.1)

## 2022-12-19 LAB — URINALYSIS, ROUTINE W REFLEX MICROSCOPIC
Bilirubin Urine: NEGATIVE
Glucose, UA: NEGATIVE mg/dL
Hgb urine dipstick: NEGATIVE
Ketones, ur: NEGATIVE mg/dL
Leukocytes,Ua: NEGATIVE
Nitrite: NEGATIVE
Protein, ur: NEGATIVE mg/dL
Specific Gravity, Urine: 1.004 — ABNORMAL LOW (ref 1.005–1.030)
pH: 5 (ref 5.0–8.0)

## 2022-12-19 LAB — I-STAT CHEM 8, ED
BUN: 12 mg/dL (ref 8–23)
Calcium, Ion: 0.9 mmol/L — ABNORMAL LOW (ref 1.15–1.40)
Chloride: 109 mmol/L (ref 98–111)
Creatinine, Ser: 1 mg/dL (ref 0.61–1.24)
Glucose, Bld: 70 mg/dL (ref 70–99)
HCT: 42 % (ref 39.0–52.0)
Hemoglobin: 14.3 g/dL (ref 13.0–17.0)
Potassium: 4.4 mmol/L (ref 3.5–5.1)
Sodium: 134 mmol/L — ABNORMAL LOW (ref 135–145)
TCO2: 19 mmol/L — ABNORMAL LOW (ref 22–32)

## 2022-12-19 LAB — DIFFERENTIAL
Abs Immature Granulocytes: 0.02 10*3/uL (ref 0.00–0.07)
Basophils Absolute: 0.1 10*3/uL (ref 0.0–0.1)
Basophils Relative: 1 %
Eosinophils Absolute: 0.5 10*3/uL (ref 0.0–0.5)
Eosinophils Relative: 8 %
Immature Granulocytes: 0 %
Lymphocytes Relative: 39 %
Lymphs Abs: 2.2 10*3/uL (ref 0.7–4.0)
Monocytes Absolute: 0.5 10*3/uL (ref 0.1–1.0)
Monocytes Relative: 9 %
Neutro Abs: 2.4 10*3/uL (ref 1.7–7.7)
Neutrophils Relative %: 43 %

## 2022-12-19 LAB — RAPID URINE DRUG SCREEN, HOSP PERFORMED
Amphetamines: NOT DETECTED
Barbiturates: POSITIVE — AB
Benzodiazepines: NOT DETECTED
Cocaine: NOT DETECTED
Opiates: NOT DETECTED
Tetrahydrocannabinol: NOT DETECTED

## 2022-12-19 LAB — CBG MONITORING, ED: Glucose-Capillary: 77 mg/dL (ref 70–99)

## 2022-12-19 LAB — PHENOBARBITAL LEVEL: Phenobarbital: 18.8 ug/mL (ref 15.0–40.0)

## 2022-12-19 LAB — ETHANOL: Alcohol, Ethyl (B): 134 mg/dL — ABNORMAL HIGH (ref <10)

## 2022-12-19 MED ORDER — GABAPENTIN 100 MG PO CAPS
100.0000 mg | ORAL_CAPSULE | Freq: Three times a day (TID) | ORAL | Status: DC
  Administered 2022-12-19: 100 mg via ORAL
  Filled 2022-12-19: qty 1

## 2022-12-19 MED ORDER — THIAMINE HCL 100 MG/ML IJ SOLN: 100.0000 mg | Freq: Every day | INTRAMUSCULAR | Status: DC

## 2022-12-19 MED ORDER — SODIUM CHLORIDE 0.9% FLUSH
3.0000 mL | Freq: Once | INTRAVENOUS | Status: AC
  Administered 2022-12-19: 3 mL via INTRAVENOUS

## 2022-12-19 MED ORDER — THIAMINE HCL 100 MG/ML IJ SOLN
250.0000 mg | Freq: Every day | INTRAVENOUS | Status: DC
  Administered 2022-12-22: 250 mg via INTRAVENOUS
  Filled 2022-12-19 (×2): qty 2.5

## 2022-12-19 MED ORDER — LEVETIRACETAM 500 MG PO TABS
1500.0000 mg | ORAL_TABLET | Freq: Two times a day (BID) | ORAL | Status: DC
  Administered 2022-12-19 – 2022-12-20 (×2): 1500 mg via ORAL
  Filled 2022-12-19 (×2): qty 3

## 2022-12-19 MED ORDER — THIAMINE HCL 100 MG/ML IJ SOLN
500.0000 mg | Freq: Three times a day (TID) | INTRAVENOUS | Status: AC
  Administered 2022-12-20 – 2022-12-21 (×6): 500 mg via INTRAVENOUS
  Filled 2022-12-19 (×9): qty 5

## 2022-12-19 MED ORDER — LACOSAMIDE 50 MG PO TABS
150.0000 mg | ORAL_TABLET | Freq: Two times a day (BID) | ORAL | Status: DC
  Administered 2022-12-19 – 2022-12-20 (×2): 150 mg via ORAL
  Filled 2022-12-19 (×3): qty 3

## 2022-12-19 MED ORDER — PHENOBARBITAL 32.4 MG PO TABS
64.8000 mg | ORAL_TABLET | Freq: Two times a day (BID) | ORAL | Status: DC
  Administered 2022-12-19 – 2022-12-24 (×10): 64.8 mg via ORAL
  Filled 2022-12-19 (×8): qty 2
  Filled 2022-12-19 (×2): qty 1
  Filled 2022-12-19: qty 2

## 2022-12-19 MED ORDER — CLONAZEPAM 1 MG PO TABS
1.0000 mg | ORAL_TABLET | Freq: Two times a day (BID) | ORAL | Status: DC
  Administered 2022-12-19 – 2022-12-24 (×10): 1 mg via ORAL
  Filled 2022-12-19 (×5): qty 1
  Filled 2022-12-19: qty 2
  Filled 2022-12-19 (×3): qty 1
  Filled 2022-12-19: qty 2
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
## 2022-12-19 NOTE — ED Provider Notes (Signed)
[place] EMERGENCY DEPARTMENT AT [facility] Provider Note   CSN: [id number] Arrival date & time: 12/19/22  2024     History  No chief complaint on file.   Seth [name] is a 67 y.o. male.  The history is provided by the patient, the EMS personnel and medical records. No language interpreter was used.  Neurologic Problem This is a new problem. The current episode started 1 to 2 hours ago. The problem occurs constantly. The problem has been gradually improving. Associated symptoms include headaches. Pertinent negatives include no chest pain, no abdominal pain and no shortness of breath. Nothing aggravates the symptoms. Nothing relieves the symptoms. He has tried nothing for the symptoms. The treatment provided no relief.       Home Medications Prior to Admission medications   Medication Sig Start Date End Date Taking? Authorizing Provider  acetaminophen (TYLENOL) 325 MG tablet Take 2 tablets (650 mg total) by mouth every 6 (six) hours as needed for headache or mild pain. 10/18/21   Almon Hercules, MD  clonazePAM (KLONOPIN) 1 MG tablet Take 1 tablet (1 mg total) by mouth 2 (two) times daily. 12/01/22 12/31/22  Lorin Glass, MD  clopidogrel (PLAVIX) 75 MG tablet Take 1 tablet (75 mg total) by mouth daily. 05/24/21   Leroy Sea, MD  cyanocobalamin (VITAMIN B12) 1000 MCG tablet Take 1,000 mcg by mouth every other day. 10/09/22   [provider]  EQ ASPIRIN ADULT LOW DOSE 81 MG tablet Take 81 mg by mouth daily. 10/09/22   [provider]  feeding supplement (ENSURE ENLIVE / ENSURE PLUS) LIQD Take 237 mLs by mouth 3 (three) times daily between meals. 07/30/20   Ghimire, Werner Lean, MD  FEROSUL 325 (65 Fe) MG tablet Take 325 mg by mouth daily. 10/09/22   [provider]  fluticasone-salmeterol (ADVAIR) 500-50 MCG/ACT AEPB Inhale 1 puff into the lungs 2 (two) times daily. 04/13/21   [provider]  folic acid (FOLVITE) 1 MG tablet Take 1 mg by  mouth daily. 10/09/22   [provider]  gabapentin (NEURONTIN) 300 MG capsule Take 300 mg by mouth 3 (three) times daily. 07/07/21   [provider]  INCRUSE ELLIPTA 62.5 MCG/INH AEPB INHALE 1 PUFF INTO THE LUNGS DAILY Patient taking differently: Inhale 1 puff into the lungs daily. 04/08/19   Julieanne Manson, MD  Lacosamide 150 MG TABS Take 1 tablet (150 mg total) by mouth 2 (two) times daily. 12/01/22 12/31/22  Lorin Glass, MD  levETIRAcetam (KEPPRA) 750 MG tablet Take 2 tablets (1,500 mg total) by mouth 2 (two) times daily. 03/20/21   Erick Blinks, MD  metoprolol tartrate (LOPRESSOR) 50 MG tablet Take 1 tablet (50 mg total) by mouth 2 (two) times daily. 03/20/21   Erick Blinks, MD  Multiple Vitamins-Minerals (DAILY VITAMIN FORMULA+MINERALS) TABS Take 1 tablet by mouth daily. 10/09/22   [provider]  omeprazole (PRILOSEC) 40 MG capsule Take 40 mg by mouth daily. 10/09/22   [provider]  PHENobarbital (LUMINAL) 64.8 MG tablet Take 1 tablet (64.8 mg total) by mouth 2 (two) times daily. 12/01/22 12/31/22  Lorin Glass, MD  rosuvastatin (CRESTOR) 10 MG tablet Take 1 tablet (10 mg total) by mouth daily. 09/28/20   Marinda Elk, MD  sertraline (ZOLOFT) 25 MG tablet Take 25 mg by mouth daily.    [provider]  thiamine (VITAMIN B1) 100 MG tablet Take 100 mg by mouth daily. 10/09/22   [provider]  Zinc Sulfate 220 (50 Zn) MG TABS Take 1 tablet by mouth daily. 10/09/22   [provider]      Allergies    Patient has no known allergies.    Review of Systems   Review of Systems  Constitutional:  Negative for chills, fatigue and fever.  HENT:  Negative for congestion.   Eyes:  Negative for visual disturbance.  Respiratory:  Negative for cough, chest tightness, shortness of breath and wheezing.   Cardiovascular:  Negative for chest pain.  Gastrointestinal:  Negative for abdominal pain, constipation, diarrhea, nausea and  vomiting.  Genitourinary:  Negative for flank pain.  Musculoskeletal:  Negative for back pain, neck pain and neck stiffness.  Skin:  Negative for rash.  Neurological:  Positive for speech difficulty, weakness, numbness and headaches. Negative for facial asymmetry and light-headedness.  Psychiatric/Behavioral:  Negative for agitation and confusion.   All other systems reviewed and are negative.   Physical Exam Updated Vital Signs There were no vitals taken for this visit. Physical Exam Vitals and nursing note reviewed.  Constitutional:      General: He is not in acute distress.    Appearance: He is well-developed. He is not ill-appearing, toxic-appearing or diaphoretic.  HENT:     Head: Normocephalic and atraumatic.     Nose: Nose normal. No congestion or rhinorrhea.     Mouth/Throat:     Mouth: Mucous membranes are dry.     Pharynx: No oropharyngeal exudate or posterior oropharyngeal erythema.  Eyes:     Extraocular Movements: Extraocular movements intact.     Conjunctiva/sclera: Conjunctivae normal.     Pupils: Pupils are equal, round, and reactive to light.  Cardiovascular:     Rate and Rhythm: Normal rate and regular rhythm.     Heart sounds: No murmur heard. Pulmonary:     Effort: Pulmonary effort is normal. No respiratory distress.     Breath sounds: Normal breath sounds. No wheezing, rhonchi or rales.  Chest:     Chest wall: No tenderness.  Abdominal:     Palpations: Abdomen is soft.     Tenderness: There is no abdominal tenderness. There is no guarding or rebound.  Musculoskeletal:        General: No swelling or tenderness.     Cervical back: Neck supple.  Skin:    General: Skin is warm and dry.     Capillary Refill: Capillary refill takes less than 2 seconds.     Findings: No erythema.  Neurological:     Mental Status: He is alert.     Sensory: Sensory deficit present.     Motor: Weakness present.     Comments: Numbness and slight weakness in right arm and  right leg.  Symmetric smile.  Clear speech.  Pupils symmetric and reactive.  Psychiatric:        Mood and Affect: Mood normal.     ED Results / Procedures / Treatments   Labs (all labs ordered are listed, but only abnormal results are displayed) Labs Reviewed  CBC - Abnormal; Notable for the following components:      Result Value   Hemoglobin 12.8 (*)    RDW 16.4 (*)    All other components within normal limits  COMPREHENSIVE METABOLIC PANEL - Abnormal; Notable for the following components:   CO2 17 (*)    Alkaline Phosphatase 173 (*)    Anion gap 17 (*)    All other components within normal limits  ETHANOL - Abnormal; Notable  for the following components:   Alcohol, Ethyl (B) 134 (*)    All other components within normal limits  URINALYSIS, ROUTINE W REFLEX MICROSCOPIC - Abnormal; Notable for the following components:   Color, Urine STRAW (*)    Specific Gravity, Urine 1.004 (*)    All other components within normal limits  RAPID URINE DRUG SCREEN, HOSP PERFORMED - Abnormal; Notable for the following components:   Barbiturates POSITIVE (*)    All other components within normal limits  I-STAT CHEM 8, ED - Abnormal; Notable for the following components:   Sodium 134 (*)    Calcium, Ion 0.90 (*)    TCO2 19 (*)    All other components within normal limits  DIFFERENTIAL  PHENOBARBITAL LEVEL  VITAMIN B12  LEVETIRACETAM LEVEL  LACOSAMIDE  PROTIME-INR  APTT  MAGNESIUM  VITAMIN B1  CBG MONITORING, ED    EKG EKG Interpretation  Date/Time:  Tuesday December 19 2022 22:33:50 EDT Ventricular Rate:  71 PR Interval:  274 QRS Duration: 79 QT Interval:  382 QTC Calculation: 416 R Axis:   59 Text Interpretation: Sinus rhythm Prolonged PR interval Minimal ST elevation, inferior leads when compared to prior, overall similar appearnace. No STEMI Confirmed by Theda Belfast ([id number]) on 12/19/2022 11:20:19 PM  Radiology EEG adult  Result Date: 12/19/2022 Charlsie Quest, MD      12/19/2022 10:16 PM Patient Name: Seth [name] MRN: [id number] Epilepsy Attending: Charlsie Quest Referring Physician/Provider: Gordy Councilman, MD Date: 12/19/2022 Duration: 22.59 mins Patient history: 67yo M with epilepsy getting eeg to evaluate for seizure Level of alertness: Awake AEDs during EEG study: LEV, LCM, Phenobarb Technical aspects: This EEG study was done with scalp electrodes positioned according to the 10-20 International system of electrode placement. Electrical activity was reviewed with band pass filter of 1-70Hz , sensitivity of 7 uV/mm, display speed of 66mm/sec with a 60Hz  notched filter applied as appropriate. EEG data were recorded continuously and digitally stored.  Video monitoring was available and reviewed as appropriate. Description: The posterior dominant rhythm consists of 8-9 Hz activity of moderate voltage (25-35 uV) seen predominantly in posterior head regions, asymmetric ( left<right) and reactive to eye opening and eye closing. EEG showed continuous rhythmic 2-3hz  delta slowing in left hemisphere, maximal left posterior quadrant. Sharp waves were noted in left posterior quadrant.  Photic driving was not seen during photic stimulation. Hyperventilation was not performed.   ABNORMALITY -Sharp wave, left posterior quadrant - Lateralized rhythmic delta activity, left hemisphere, maximal left posterior quadrant. 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 Additionally there is cortical dysfunction arising from left hemisphere, maximal left posterior quadrant likely secondary to underlying structural abnormality. This eeg pattern is on the ictal-interictal continuum with low potential for seizure recurrence. No seizures were seen throughout the recording. If suspicion for ictal activity remains a concern, a prolonged study can be considered. Dr. Iver Nestle was notified. Charlsie Quest   CT HEAD CODE STROKE WO  CONTRAST  Result Date: 12/19/2022 CLINICAL DATA:  Code stroke. Initial evaluation for neuro deficit, stroke. EXAM: CT HEAD WITHOUT CONTRAST TECHNIQUE: Contiguous axial images were obtained from the base of the skull through the vertex without intravenous contrast. RADIATION DOSE REDUCTION: This exam was performed according to the departmental dose-optimization program which includes automated exposure control, adjustment of the mA and/or kV according to patient size and/or use of iterative reconstruction technique. COMPARISON:  Prior study from 11/29/2022. FINDINGS: Brain: Generalized age-related  cerebral volume loss with chronic small vessel ischemic disease. Few scattered remote cerebellar infarcts noted. No acute intracranial hemorrhage. No acute large vessel territory infarct. No mass lesion or midline shift. Mild ventricular prominence related to global parenchymal volume loss without hydrocephalus. No extra-axial fluid collection. Vascular: No abnormal hyperdense vessel. Calcified atherosclerosis present at the skull base. Skull: Scalp soft tissues demonstrate no acute finding. Calvarium intact. Sinuses/Orbits: Globes and orbital soft tissues within normal limits. Scattered mucosal thickening noted within the ethmoidal air cells with small volume layering secretions within the left maxillary sinus. No mastoid effusion. Other: None. ASPECTS [facility] Stroke Program Early CT Score) - Ganglionic level infarction (caudate, lentiform nuclei, internal capsule, insula, M1-M3 cortex): 7 - Supraganglionic infarction (M4-M6 cortex): 3 Total score (0-10 with 10 being normal): 10 IMPRESSION: 1. No acute intracranial abnormality. 2. ASPECTS is 10. 3. Age-related cerebral atrophy with chronic small vessel ischemic disease, with a few scattered chronic cerebellar infarcts. These results were communicated to Dr. Iver Nestle at 8:53 pm on 12/19/2022 by text page via the [facility] messaging system. Electronically Signed   By: Rise Mu M.D.   On: 12/19/2022 20:55    Procedures Procedures    Medications Ordered in ED Medications  sodium chloride flush (NS) 0.9 % injection 3 mL (has no administration in time range)    ED Course/ Medical Decision Making/ A&P                             Medical Decision Making Amount and/or Complexity of Data Reviewed Labs: ordered. Radiology: ordered.    ANFRENEE MCCLARIN is a 67 y.o. male with past medical history significant for alcohol abuse, COPD, previous stroke, previous seizures, and depression who presents as a code stroke.  According to EMS, patient was normal at 6 PM but at 630 started having witnessed right-sided weakness in his arm and leg and had difficulty speaking.  Glucose was found to be normal and patient activated as a code stroke.  EMS said that patient was still having symptoms during transport.  He was also complaining of some right-sided headache although there was no reported falls.  EMS reports patient drinks atypical amount of alcohol and he did not deviate from this today.  He also reportedly has some issues with noncompliance with his seizure medications and does not remember what he is supposed to take when asked upon arrival.  On arrival, patient is answering questions appropriately.  Symmetric smile and speech was clear for me.  Pupils are symmetric and reactive.  Lungs clear and chest nontender.  Abdomen nontender.  No evidence of acute trauma initially.  Patient taken to CT scanner for further workup.   Anticipate follow-up on neurology recommendations for further management.  Care transferred to oncoming team to wait for neurology recommendations and workup completion.        Final Clinical Impression(s) / ED Diagnoses Final diagnoses:  Neurological deficit present    Clinical Impression: 1. Neurological deficit present     Disposition: Care transferred to oncoming team to wait for neurology recommendations and workup  completion.  This note was prepared with assistance of Conservation officer, historic buildings. Occasional wrong-word or sound-a-like substitutions may have occurred due to the inherent limitations of voice recognition software.     Maham Quintin, Canary Brim, MD 12/20/22 [phone]

## 2022-12-19 NOTE — Procedures (Signed)
Patient Name: Seth [name]  MRN: [id number]  Epilepsy Attending: Charlsie Quest  Referring Physician/Provider: Gordy Councilman, MD  Date: 12/19/2022  Duration: 22.59 mins  Patient history: 67yo M with epilepsy getting eeg to evaluate for seizure  Level of alertness: Awake  AEDs during EEG study: LEV, LCM, Phenobarb  Technical aspects: This EEG study was done with scalp electrodes positioned according to the 10-20 International system of electrode placement. Electrical activity was reviewed with band pass filter of 1-70Hz , sensitivity of 7 uV/mm, display speed of 34mm/sec with a  notched filter applied as appropriate. EEG data were recorded continuously and digitally stored.  Video monitoring was available and reviewed as appropriate.  Description: The posterior dominant rhythm consists of 8-9 Hz activity of moderate voltage (25-35 uV) seen predominantly in posterior head regions, asymmetric ( left<right) and reactive to eye opening and eye closing. EEG showed continuous rhythmic 2-3hz  delta slowing in left hemisphere, maximal left posterior quadrant. Sharp waves were noted in left posterior quadrant.  Photic driving was not seen during photic stimulation. Hyperventilation was not performed.     ABNORMALITY -Sharp wave, left posterior quadrant - Lateralized rhythmic delta activity, left hemisphere, maximal left posterior quadrant.  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 Additionally there is cortical dysfunction arising from left hemisphere, maximal left posterior quadrant likely secondary to underlying structural abnormality. This eeg pattern is on the ictal-interictal continuum with low potential for seizure recurrence. No seizures were seen throughout the recording.  If suspicion for ictal activity remains a concern, a prolonged study can be considered.   Dr. Iver Nestle was notified.  [name] [name]

## 2022-12-19 NOTE — Progress Notes (Signed)
STAT EEG complete - results pending. ? ?

## 2022-12-19 NOTE — Code Documentation (Signed)
Responded to Code Stroke called at 2010 for R sided weakness, slurred speech, and headache, [id number]. Pt arrived at 2024, CBG-77, NIH-7, CT head negative for acute changes. TNK not given-low suspicion for stroke. Plan VS/neuro checks q2h x 12, then q4h, CTA head/neck when IV access obtained, EEG, AED lab levels.

## 2022-12-20 ENCOUNTER — Other Ambulatory Visit: Payer: Medicare Other

## 2022-12-20 ENCOUNTER — Encounter (HOSPITAL_COMMUNITY): Payer: Self-pay | Admitting: Internal Medicine

## 2022-12-20 ENCOUNTER — Observation Stay (HOSPITAL_COMMUNITY): Payer: Medicare Other

## 2022-12-20 DIAGNOSIS — F10129 Alcohol abuse with intoxication, unspecified: Secondary | ICD-10-CM | POA: Diagnosis present

## 2022-12-20 DIAGNOSIS — F10930 Alcohol use, unspecified with withdrawal, uncomplicated: Secondary | ICD-10-CM

## 2022-12-20 DIAGNOSIS — K219 Gastro-esophageal reflux disease without esophagitis: Secondary | ICD-10-CM

## 2022-12-20 DIAGNOSIS — I714 Abdominal aortic aneurysm, without rupture, unspecified: Secondary | ICD-10-CM | POA: Diagnosis present

## 2022-12-20 DIAGNOSIS — R531 Weakness: Secondary | ICD-10-CM | POA: Diagnosis not present

## 2022-12-20 DIAGNOSIS — E11649 Type 2 diabetes mellitus with hypoglycemia without coma: Secondary | ICD-10-CM | POA: Diagnosis present

## 2022-12-20 DIAGNOSIS — I69351 Hemiplegia and hemiparesis following cerebral infarction affecting right dominant side: Secondary | ICD-10-CM | POA: Diagnosis not present

## 2022-12-20 DIAGNOSIS — I5032 Chronic diastolic (congestive) heart failure: Secondary | ICD-10-CM | POA: Diagnosis present

## 2022-12-20 DIAGNOSIS — F32A Depression, unspecified: Secondary | ICD-10-CM | POA: Diagnosis present

## 2022-12-20 DIAGNOSIS — E1140 Type 2 diabetes mellitus with diabetic neuropathy, unspecified: Secondary | ICD-10-CM | POA: Diagnosis present

## 2022-12-20 DIAGNOSIS — R569 Unspecified convulsions: Secondary | ICD-10-CM | POA: Diagnosis present

## 2022-12-20 DIAGNOSIS — G40109 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, not intractable, without status epilepticus: Secondary | ICD-10-CM | POA: Diagnosis present

## 2022-12-20 DIAGNOSIS — D320 Benign neoplasm of cerebral meninges: Secondary | ICD-10-CM | POA: Diagnosis present

## 2022-12-20 DIAGNOSIS — I440 Atrioventricular block, first degree: Secondary | ICD-10-CM | POA: Diagnosis present

## 2022-12-20 DIAGNOSIS — Z89432 Acquired absence of left foot: Secondary | ICD-10-CM | POA: Diagnosis not present

## 2022-12-20 DIAGNOSIS — F17200 Nicotine dependence, unspecified, uncomplicated: Secondary | ICD-10-CM

## 2022-12-20 DIAGNOSIS — J449 Chronic obstructive pulmonary disease, unspecified: Secondary | ICD-10-CM

## 2022-12-20 DIAGNOSIS — G8384 Todd's paralysis (postepileptic): Secondary | ICD-10-CM | POA: Diagnosis present

## 2022-12-20 DIAGNOSIS — I110 Hypertensive heart disease with heart failure: Secondary | ICD-10-CM | POA: Diagnosis present

## 2022-12-20 DIAGNOSIS — I10 Essential (primary) hypertension: Secondary | ICD-10-CM

## 2022-12-20 DIAGNOSIS — G928 Other toxic encephalopathy: Secondary | ICD-10-CM | POA: Diagnosis present

## 2022-12-20 DIAGNOSIS — E785 Hyperlipidemia, unspecified: Secondary | ICD-10-CM | POA: Diagnosis present

## 2022-12-20 DIAGNOSIS — F17210 Nicotine dependence, cigarettes, uncomplicated: Secondary | ICD-10-CM | POA: Diagnosis present

## 2022-12-20 DIAGNOSIS — Z9181 History of falling: Secondary | ICD-10-CM | POA: Diagnosis not present

## 2022-12-20 DIAGNOSIS — G40909 Epilepsy, unspecified, not intractable, without status epilepticus: Secondary | ICD-10-CM | POA: Diagnosis not present

## 2022-12-20 DIAGNOSIS — F1090 Alcohol use, unspecified, uncomplicated: Secondary | ICD-10-CM

## 2022-12-20 DIAGNOSIS — Z8619 Personal history of other infectious and parasitic diseases: Secondary | ICD-10-CM | POA: Diagnosis not present

## 2022-12-20 DIAGNOSIS — Z981 Arthrodesis status: Secondary | ICD-10-CM | POA: Diagnosis not present

## 2022-12-20 DIAGNOSIS — Z79899 Other long term (current) drug therapy: Secondary | ICD-10-CM | POA: Diagnosis not present

## 2022-12-20 DIAGNOSIS — Y908 Blood alcohol level of 240 mg/100 ml or more: Secondary | ICD-10-CM | POA: Diagnosis present

## 2022-12-20 DIAGNOSIS — G312 Degeneration of nervous system due to alcohol: Secondary | ICD-10-CM | POA: Diagnosis present

## 2022-12-20 DIAGNOSIS — N400 Enlarged prostate without lower urinary tract symptoms: Secondary | ICD-10-CM | POA: Diagnosis present

## 2022-12-20 DIAGNOSIS — E162 Hypoglycemia, unspecified: Secondary | ICD-10-CM

## 2022-12-20 LAB — CBC WITH DIFFERENTIAL/PLATELET
Abs Immature Granulocytes: 0.01 10*3/uL (ref 0.00–0.07)
Basophils Absolute: 0.1 10*3/uL (ref 0.0–0.1)
Basophils Relative: 1 %
Eosinophils Absolute: 0.5 10*3/uL (ref 0.0–0.5)
Eosinophils Relative: 10 %
HCT: 39.3 % (ref 39.0–52.0)
Hemoglobin: 12.4 g/dL — ABNORMAL LOW (ref 13.0–17.0)
Immature Granulocytes: 0 %
Lymphocytes Relative: 36 %
Lymphs Abs: 1.9 10*3/uL (ref 0.7–4.0)
MCH: 29.4 pg (ref 26.0–34.0)
MCHC: 31.6 g/dL (ref 30.0–36.0)
MCV: 93.1 fL (ref 80.0–100.0)
Monocytes Absolute: 0.8 10*3/uL (ref 0.1–1.0)
Monocytes Relative: 15 %
Neutro Abs: 2 10*3/uL (ref 1.7–7.7)
Neutrophils Relative %: 38 %
Platelets: 359 10*3/uL (ref 150–400)
RBC: 4.22 MIL/uL (ref 4.22–5.81)
RDW: 16.1 % — ABNORMAL HIGH (ref 11.5–15.5)
WBC: 5.4 10*3/uL (ref 4.0–10.5)
nRBC: 0 % (ref 0.0–0.2)

## 2022-12-20 LAB — CBG MONITORING, ED
Glucose-Capillary: 66 mg/dL — ABNORMAL LOW (ref 70–99)
Glucose-Capillary: 89 mg/dL (ref 70–99)
Glucose-Capillary: 52 mg/dL — ABNORMAL LOW (ref 70–99)
Glucose-Capillary: 138 mg/dL — ABNORMAL HIGH (ref 70–99)
Glucose-Capillary: 57 mg/dL — ABNORMAL LOW (ref 70–99)
Glucose-Capillary: 77 mg/dL (ref 70–99)
Glucose-Capillary: 115 mg/dL — ABNORMAL HIGH (ref 70–99)

## 2022-12-20 LAB — COMPREHENSIVE METABOLIC PANEL
ALT: 14 U/L (ref 0–44)
AST: 20 U/L (ref 15–41)
Albumin: 3 g/dL — ABNORMAL LOW (ref 3.5–5.0)
Alkaline Phosphatase: 149 U/L — ABNORMAL HIGH (ref 38–126)
Anion gap: 11 (ref 5–15)
BUN: 10 mg/dL (ref 8–23)
CO2: 18 mmol/L — ABNORMAL LOW (ref 22–32)
Calcium: 8.6 mg/dL — ABNORMAL LOW (ref 8.9–10.3)
Chloride: 107 mmol/L (ref 98–111)
Creatinine, Ser: 0.88 mg/dL (ref 0.61–1.24)
GFR, Estimated: >60 mL/min (ref >60)
Glucose, Bld: 60 mg/dL — ABNORMAL LOW (ref 70–99)
Potassium: 4.3 mmol/L (ref 3.5–5.1)
Sodium: 136 mmol/L (ref 135–145)
Total Bilirubin: 0.5 mg/dL (ref 0.3–1.2)
Total Protein: 6.1 g/dL — ABNORMAL LOW (ref 6.5–8.1)

## 2022-12-20 LAB — GLUCOSE, CAPILLARY
Glucose-Capillary: 111 mg/dL — ABNORMAL HIGH (ref 70–99)
Glucose-Capillary: 84 mg/dL (ref 70–99)

## 2022-12-20 LAB — PROTIME-INR
INR: 1.1 (ref 0.8–1.2)
Prothrombin Time: 14 seconds (ref 11.4–15.2)

## 2022-12-20 LAB — MAGNESIUM
Magnesium: 2.2 mg/dL (ref 1.7–2.4)
Magnesium: 2 mg/dL (ref 1.7–2.4)

## 2022-12-20 LAB — APTT: aPTT: 38 seconds — ABNORMAL HIGH (ref 24–36)

## 2022-12-20 MED ORDER — LEVETIRACETAM 750 MG PO TABS
2000.0000 mg | ORAL_TABLET | Freq: Two times a day (BID) | ORAL | Status: DC
  Administered 2022-12-20 – 2022-12-24 (×8): 2000 mg via ORAL
  Filled 2022-12-20 (×8): qty 1

## 2022-12-20 MED ORDER — LORAZEPAM 1 MG PO TABS: 1.0000 mg | ORAL_TABLET | ORAL | Status: DC | PRN

## 2022-12-20 MED ORDER — MELATONIN 3 MG PO TABS
3.0000 mg | ORAL_TABLET | Freq: Every evening | ORAL | Status: DC | PRN
  Administered 2022-12-21 – 2022-12-23 (×3): 3 mg via ORAL
  Filled 2022-12-20 (×3): qty 1

## 2022-12-20 MED ORDER — ACETAMINOPHEN 650 MG RE SUPP: 650.0000 mg | Freq: Four times a day (QID) | RECTAL | Status: DC | PRN

## 2022-12-20 MED ORDER — LORAZEPAM 2 MG/ML IJ SOLN: 1.0000 mg | INTRAMUSCULAR | Status: DC | PRN

## 2022-12-20 MED ORDER — THIAMINE HCL 100 MG/ML IJ SOLN
100.0000 mg | Freq: Every day | INTRAMUSCULAR | Status: DC
  Administered 2022-12-20: 100 mg via INTRAVENOUS
  Filled 2022-12-20: qty 2

## 2022-12-20 MED ORDER — PROSIGHT PO TABS
1.0000 | ORAL_TABLET | Freq: Every day | ORAL | Status: DC
  Administered 2022-12-20 – 2022-12-24 (×5): 1 via ORAL
  Filled 2022-12-20 (×5): qty 1

## 2022-12-20 MED ORDER — DEXTROSE 50 % IV SOLN
1.0000 | INTRAVENOUS | Status: DC | PRN
  Administered 2022-12-20 (×2): 50 mL via INTRAVENOUS
  Filled 2022-12-20 (×2): qty 50

## 2022-12-20 MED ORDER — ONDANSETRON HCL 4 MG/2ML IJ SOLN: 4.0000 mg | Freq: Four times a day (QID) | INTRAMUSCULAR | Status: DC | PRN

## 2022-12-20 MED ORDER — ALBUTEROL SULFATE (2.5 MG/3ML) 0.083% IN NEBU: 2.5000 mg | INHALATION_SOLUTION | RESPIRATORY_TRACT | Status: DC | PRN

## 2022-12-20 MED ORDER — FOLIC ACID 1 MG PO TABS
1.0000 mg | ORAL_TABLET | Freq: Every day | ORAL | Status: DC
  Administered 2022-12-20 – 2022-12-24 (×5): 1 mg via ORAL
  Filled 2022-12-20 (×5): qty 1

## 2022-12-20 MED ORDER — PANTOPRAZOLE SODIUM 40 MG PO TBEC
40.0000 mg | DELAYED_RELEASE_TABLET | Freq: Every day | ORAL | Status: DC
  Administered 2022-12-20 – 2022-12-24 (×5): 40 mg via ORAL
  Filled 2022-12-20 (×5): qty 1

## 2022-12-20 MED ORDER — MOMETASONE FURO-FORMOTEROL FUM 200-5 MCG/ACT IN AERO
2.0000 | INHALATION_SPRAY | Freq: Two times a day (BID) | RESPIRATORY_TRACT | Status: DC
  Administered 2022-12-20 – 2022-12-24 (×8): 2 via RESPIRATORY_TRACT
  Filled 2022-12-20: qty 8.8

## 2022-12-20 MED ORDER — DEXTROSE 5 % IV SOLN: INTRAVENOUS | Status: DC

## 2022-12-20 MED ORDER — NICOTINE 14 MG/24HR TD PT24: 14.0000 mg | MEDICATED_PATCH | Freq: Every day | TRANSDERMAL | Status: DC | PRN

## 2022-12-20 MED ORDER — GABAPENTIN 300 MG PO CAPS
300.0000 mg | ORAL_CAPSULE | Freq: Three times a day (TID) | ORAL | Status: DC
  Administered 2022-12-20 – 2022-12-24 (×13): 300 mg via ORAL
  Filled 2022-12-20 (×13): qty 1

## 2022-12-20 MED ORDER — ASPIRIN 81 MG PO TBEC
81.0000 mg | DELAYED_RELEASE_TABLET | Freq: Every day | ORAL | Status: DC
  Administered 2022-12-20 – 2022-12-24 (×5): 81 mg via ORAL
  Filled 2022-12-20 (×5): qty 1

## 2022-12-20 MED ORDER — DEXTROSE 50 % IV SOLN
1.0000 | Freq: Once | INTRAVENOUS | Status: AC
  Administered 2022-12-20: 50 mL via INTRAVENOUS
  Filled 2022-12-20: qty 50

## 2022-12-20 MED ORDER — ACETAMINOPHEN 325 MG PO TABS
650.0000 mg | ORAL_TABLET | Freq: Four times a day (QID) | ORAL | Status: DC | PRN
  Administered 2022-12-23 – 2022-12-24 (×2): 650 mg via ORAL
  Filled 2022-12-20 (×2): qty 2

## 2022-12-20 MED ORDER — IOHEXOL 350 MG/ML SOLN
75.0000 mL | Freq: Once | INTRAVENOUS | Status: AC | PRN
  Administered 2022-12-20: 75 mL via INTRAVENOUS

## 2022-12-20 MED ORDER — LACOSAMIDE 50 MG PO TABS
100.0000 mg | ORAL_TABLET | Freq: Two times a day (BID) | ORAL | Status: DC
  Administered 2022-12-20 – 2022-12-24 (×8): 100 mg via ORAL
  Filled 2022-12-20 (×8): qty 2

## 2022-12-20 MED ORDER — ROSUVASTATIN CALCIUM 5 MG PO TABS
10.0000 mg | ORAL_TABLET | Freq: Every day | ORAL | Status: DC
  Administered 2022-12-20 – 2022-12-24 (×5): 10 mg via ORAL
  Filled 2022-12-20 (×5): qty 2

## 2022-12-20 NOTE — Progress Notes (Signed)
Subjective: No seizures overnight.  Patient waking up unable to answer questions this morning.  ROS: negative except above  Examination  Vital signs in last 24 hours: Temp:  [98 F (36.7 C)-98.2 F (36.8 C)] 98.2 F (36.8 C) (04/24 0837) Pulse Rate:  [58-91] 61 (04/24 1200) Resp:  [12-21] 12 (04/24 1200) BP: (101-162)/(66-94) 137/84 (04/24 1200) SpO2:  [94 %-100 %] 100 % (04/24 1200) Weight:  [76.4 kg] 76.4 kg (04/23 2000)  General: lying in bed, NAD Neuro: MS: Alert, oriented to place and person, time: August, follows commands. CN: pupils equal and reactive,  EOMI, face symmetric, tongue midline, normal sensation over face, Motor: 4/5 in right upper and right lower extremity, 5/5 in left upper and left lower extremity.  Basic Metabolic Panel: Recent Labs  Lab 12/19/22 2150 12/19/22 2153 12/20/22 0513  NA 138 134* 136  K 4.4 4.4 4.3  CL 104 109 107  CO2 17*  --  18*  GLUCOSE 70 70 60*  BUN CREATININE 0.87 1.00 0.88  CALCIUM 9.3  --  8.6*  MG  --   --  2.2    CBC: Recent Labs  Lab 12/19/22 2150 12/19/22 2153 12/20/22 0513  WBC 5.6  --  5.4  NEUTROABS 2.4  --  2.0  HGB 12.8* 14.3 12.4*  HCT 42.4 42.0 39.3  MCV 99.8  --  93.1  PLT 251  --  359     Coagulation Studies: No results for input(s): "LABPROT", "INR" in the last 72 hours.  Imaging personally reviewed. MRI brain without contrast 12/20/2022: Stable noncontrast brain MRI with no acute intracranial pathology.   ASSESSMENT AND PLAN: 67 year old male with prior history of left frontal stroke, epilepsy, alcohol use disorder with alcohol intoxication who presented with right-sided weakness, alcohol level 134 on arrival.  Chronic stroke Epilepsy Alcohol use disorder Right) (improving) -No seizures overnight.  Right-sided weakness improving.  Mental status improving. -Right hemiparesis worse likely due to recrudescence of old stroke symptoms in the setting of alcohol intoxication versus Todd's  paralysis (although no visible seizures)  Recommendations -Continue Klonopin 1 mg twice daily, gabapentin 300 mg 3 times daily -Continue phenobarbital 65 mg twice daily.  This can potentially be switched to 130 mg daily in future if patient remains seizure-free -Reduce Vimpat to 100 mg twice daily due to prolonged PR interval on EKG -Increase Keppra to 2000 mg twice daily as we are reducing Vimpat -Alcohol cessation counseling -Continue seizure precautions including do not drive -Follow-up with DR Teresa Coombs in 4-6 weeks -Management of alcohol intoxication and withdrawal per primary team -Discussed plan with Dr. Mahala Menghini via secure chat  I have spent a total of   36 minutes with the patient reviewing hospital notes,  test results, labs and examining the patient as well as establishing an assessment and plan.  > 50% of time was spent in direct patient care.    Lindie Spruce Epilepsy [facility] For questions after 5pm please refer to AMION to reach the Neurologist on call

## 2022-12-20 NOTE — ED Notes (Signed)
Glucose checked again, low, another amp of D50 given per provider order.
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
## 2022-12-20 NOTE — ED Notes (Signed)
Pt resting with eyes closed; respirations spontaneous, even, unlabored 

## 2022-12-20 NOTE — ED Notes (Signed)
Pt alert, eating lunch 

## 2022-12-20 NOTE — ED Triage Notes (Signed)
Pt arrived via [facility] for code stroke from home. Family called EMS noting right sided weakness, slurred speech, difficulty ambulating, and headache at 1830 (LNW and symptom discovered time). EMS note report of ETOH use today, "6 shots" of alcohol.   PTA EMS Vitals  BP 133/78 HR 74 SPO2 98% RA RR 18 CBG 91

## 2022-12-20 NOTE — Progress Notes (Signed)
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 in the remote past Prior CVA PAD + DES popliteal artery 08/2020 Prior orthostatic hypotension with history of falls in the remote past AAA 4 x 4 cm in 2020 Recent hospitalization 4/3-/01/15/2023 with lethargy decreased alertness subtle RUL twitching right facial droop and dysarthria-was treated by neurology for status and given IV Keppra IV Ativan and was discharged on Keppra twice daily, Vimpat dose was increased and phenobarbital was added in addition to clonazepam on that discharge-patient was recommended to go to skilled facility  Presented back to [facility]-as a code stroke with right sided weakness-TNK not given 2/2 low suspicion CVA-he is previously stayed at [facility] for about 9 months and recently started staying at his sister's place-he had 3 standard drinks yesterday (mixed drinks) and then started feeling "weak"-he told his sister he was not feeling good and came to the emergency room  CBG noted to be persistently low in the 50s and 60s serum ethanol level 134 white count 5000 phenobarbital level 18 UA negative Rest of chemistries normal-noncontrast head CT = no acute intracranial process hemorrhage, CTA head and neck no large vessel occlusion Rx in ED clonazepam Vimpat 150 Keppra 1.5 g phenobarb 64 thiamine 500 IV Neurologist Dr. Dierdre Harness consulted-recommending MRI brain, EEG awaiting serum Keppra serum Vimpat INR B1 B12 levels  Hospital-Problem based course  Acute toxic metabolic encephalopathy?  Stroke DDx seizure?  Todd's paralysis?  Intoxication Await EEG, MRI  brain (risk factor stroke 9 months ago-per patient) Seizure precautions although it looks like patient has been ordered a diet Await further input from neurology-gabapentin 300 3 times daily at this time [place] dosing B12 500 every 8 X 6+ taper as per them-B12 is pending  Chronic ethanolism Patient if precontemplative will be counseled  Profound hypoglycemia Wonder if this was a part of the differential-blood sugars were in the 50s and 60s patient was given 2 A of D50 and blood sugar is now 138 and has been placed on D5 100 cc/H Allow diet  Diabetes mellitus-complication of neuropathy History of PAD status post Lisfranc amputation of the left side Hold all hypoglycemic agents at this time-suspect that low blood sugar was secondary to not eating in the setting of alcohol use Keep on D5 100 cc/H for now check labs a.m. Continue gabapentin 300 3 times daily for presumed neuropathy  HFpEF last EF 70-75% 02/2021 In the next day to 2 days once the stroke has been ruled out we will resume Lopressor 50 twice daily Continue ASA 81, Crestor 10  Chronic issues Needs follow-up for AAA, needs follow-up for lung nodules/meningioma brain  DVT prophylaxis: SCD Code Status: Full Family Communication: None present at the bedside Disposition:  Status is: Observation The patient will require care spanning > 2 midnights and should be moved to inpatient because:   May require further workup and monitoring for seizures     Subjective: Sitting up in ED bed about to eat breakfast-coherent-slightly delayed speech but otherwise understands date time person place context and answers most questions appropriately No pain no fever no chills-narrates history of drinking last night and then feeling "weak as described above  Objective: Vitals:   12/20/22 0500 12/20/22 0530 12/20/22 0600 12/20/22 0630  BP: (!) 129/94 (!) 139/90 (!) 145/82 127/81  Pulse: 64 70 63 65  Resp: 16 16 14 16   Temp:       TempSrc:      SpO2: 100% 100% 100% 100%  Weight:       No intake or output data in the 24 hours ending 12/20/22 0720 Filed Weights   12/19/22 2000  Weight: 76.4 kg    Examination:  EOMI NCAT no focal deficit no icterus no pallor Neck soft supple Chest is clear no wheeze rales rhonchi S1-S2 no murmur Abdomen is soft no rebound Left-sided transmetatarsal amputation noted Finger-nose-finger is intact Power is 5/5 bilaterally Smile is symmetric Uvula is midline Shoulder shrug is intact bilaterally Reflexes 2/3 Sensory grossly intact  Data Reviewed: personally reviewed   CBC    Component Value Date/Time   WBC 5.4 12/20/2022 0513   RBC 4.22 12/20/2022 0513   HGB 12.4 (L) 12/20/2022 0513   HGB 12.1 (L) 05/08/2018 1317   HCT 39.3 12/20/2022 0513   HCT 28.2 (L) 02/21/2019 0554   PLT 359 12/20/2022 0513   PLT 225 05/08/2018 1317   MCV 93.1 12/20/2022 0513   MCV 100 (H) 05/08/2018 1317   MCH 29.4 12/20/2022 0513   MCHC 31.6 12/20/2022 0513   RDW 16.1 (H) 12/20/2022 0513   RDW 17.5 (H) 05/08/2018 1317   LYMPHSABS 1.9 12/20/2022 0513   LYMPHSABS 1.7 05/08/2018 1317   MONOABS 0.8 12/20/2022 0513   EOSABS 0.5 12/20/2022 0513   EOSABS 0.2 05/08/2018 1317   BASOSABS 0.1 12/20/2022 0513   BASOSABS 0.0 05/08/2018 1317      Latest Ref Rng & Units 12/20/2022    5:13 AM 12/19/2022    9:53 PM 12/19/2022    9:50 PM  CMP  Glucose 70 - 99 mg/dL 60  70  70   BUN 8 - 23 mg/dL 10  12  11    Creatinine 0.61 - 1.24 mg/dL 4.09  8.11  9.14   Sodium 135 - 145 mmol/L 136  134  138   Potassium 3.5 - 5.1 mmol/L 4.3  4.4  4.4   Chloride 98 - 111 mmol/L 107  109  104   CO2 22 - 32 mmol/L 18   17   Calcium 8.9 - 10.3 mg/dL 8.6   9.3   Total Protein 6.5 - 8.1 g/dL 6.1   7.4   Total Bilirubin 0.3 - 1.2 mg/dL 0.5   0.6   Alkaline Phos 38 - 126 U/L 149   173   AST 15 - 41 U/L 20   20   ALT 0 - 44 U/L 14   14      Radiology Studies: CT ANGIO HEAD NECK W WO CM (CODE STROKE)  Result  Date: 12/20/2022 CLINICAL DATA:  Initial evaluation for neuro deficit, stroke. No other relevant history provided. EXAM: CT ANGIOGRAPHY HEAD AND NECK WITH AND WITHOUT CONTRAST TECHNIQUE: Multidetector CT imaging of the head and neck was performed using the standard protocol during bolus administration of intravenous contrast. Multiplanar CT image reconstructions and MIPs were obtained to evaluate the vascular anatomy. Carotid stenosis measurements (when applicable) are obtained utilizing NASCET criteria, using the distal internal carotid diameter as the denominator. RADIATION DOSE REDUCTION: This exam was performed according to the departmental dose-optimization program which includes automated exposure control, adjustment of the mA and/or kV according to patient size and/or use of iterative reconstruction technique.  CONTRAST:  75mL OMNIPAQUE IOHEXOL 350 MG/ML SOLN COMPARISON:  CT from 12/19/2022. FINDINGS: CTA NECK FINDINGS Aortic arch: Aortic arch within normal limits for caliber with standard branch pattern. Atheromatous change about the arch itself. No stenosis about the origin the great vessels. Right carotid system: Right common and internal carotid arteries are patent without acute dissection. Bulky calcified plaque about the right carotid bulb/proximal right ICA, without hemodynamically significant greater than 50% stenosis, stable. 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 Left carotid system: Left common and internal carotid arteries are patent without evidence for acute dissection. Bulky calcified plaque about the left carotid bulb/proximal left ICA, but without hemodynamically significant greater than 50% stenosis. Chronic pseudoaneurysm involving the cervical left ICA, not significantly changed in appearance mild narrowing of the true lumen at this level, also unchanged. Vertebral arteries: Both vertebral arteries arise from the  subclavian arteries. No proximal subclavian artery stenosis. Atheromatous change at the origin of the right vertebral artery with estimated 30% stenosis, stable. Vertebral arteries patent distally without stenosis or dissection. Skeleton: No discrete or worrisome osseous lesions. Prior ACDF at C3-4. Mild chronic compression deformity of T5 noted. Patient is edentulous. Other neck: No other acute abnormality within the neck. Upper chest: Emphysema.  No acute finding. Review of the MIP images confirms the above findings CTA HEAD FINDINGS Anterior circulation: Atheromatous change about the carotid siphons with associated mild to moderate stenoses, slightly worse on the right, relatively stable from prior. A1 segments patent bilaterally. Normal anterior communicating complex. Both ACAs are patent without stenosis. No M1 stenosis or occlusion. No proximal MCA branch occlusion or high-grade stenosis. Distal MCA branches perfused and symmetric. Diffuse small vessel atheromatous irregularity noted. Posterior circulation: Both V4 segments are patent without significant stenosis. Both PICA patent at their origins. Basilar diminutive but patent to its distal aspect without stenosis. Superior cerebellar arteries patent bilaterally. Fetal type origin of the PCAs bilaterally. Atheromatous irregularity about the PCAs bilaterally without hemodynamically significant stenosis. Venous sinuses: Grossly patent allowing for timing the contrast bolus. 1.2 cm right parafalcine meningioma without mass effect. Anatomic variants: Fetal type origin of the PCAs with overall diminutive vertebrobasilar system. Review of the MIP images confirms the above findings IMPRESSION: 1. Negative CTA for large vessel occlusion or other emergent finding. 2. Chronic bilateral cervical ICA pseudoaneurysms, stable. 3. Atheromatous change about the carotid bifurcations and carotid siphons without hemodynamically significant greater than 50% stenosis. 4. 30%  stenosis at the origin of the right vertebral artery, stable. 5. Fetal type origin of the PCAs with overall diminutive vertebrobasilar system. Aortic Atherosclerosis (ICD10-I70.0) and Emphysema (ICD10-J43.9). Electronically Signed   By: Rise Mu M.D.   On: 12/20/2022 00:57   EEG adult  Result Date: 12/19/2022 Charlsie Quest, MD     12/19/2022 10:16 PM Patient Name: RONALDO CRILLY MRN: [id number] Epilepsy Attending: Charlsie Quest Referring Physician/Provider: Gordy Councilman, MD Date: 12/19/2022 Duration: 22.59 mins Patient history: 67yo M with epilepsy getting eeg to evaluate for seizure Level of alertness: Awake AEDs during EEG study: LEV, LCM, Phenobarb Technical aspects: This EEG study was done with scalp electrodes positioned according to the 10-20 International system of electrode placement. Electrical activity was reviewed with band pass filter of 1-70Hz , sensitivity of 7 uV/mm, display speed of 60mm/sec with a  notched filter applied as appropriate. EEG data were recorded continuously and digitally stored.  Video monitoring was available and reviewed as appropriate. Description: The posterior dominant rhythm consists of  8-9 Hz activity of moderate voltage (25-35 uV) seen predominantly in posterior head regions, asymmetric ( left<right) and reactive to eye opening and eye closing. EEG showed continuous rhythmic 2-3hz  delta slowing in left hemisphere, maximal left posterior quadrant. Sharp waves were noted in left posterior quadrant.  Photic driving was not seen during photic stimulation. Hyperventilation was not performed.   ABNORMALITY -Sharp wave, left posterior quadrant - Lateralized rhythmic delta activity, left hemisphere, maximal left posterior quadrant. 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 Additionally there is cortical dysfunction arising from left hemisphere, maximal left posterior quadrant likely  secondary to underlying structural abnormality. This eeg pattern is on the ictal-interictal continuum with low potential for seizure recurrence. No seizures were seen throughout the recording. If suspicion for ictal activity remains a concern, a prolonged study can be considered. Dr. Iver Nestle was notified. Charlsie Quest   CT HEAD CODE STROKE WO CONTRAST  Result Date: 12/19/2022 CLINICAL DATA:  Code stroke. Initial evaluation for neuro deficit, stroke. EXAM: CT HEAD WITHOUT CONTRAST TECHNIQUE: Contiguous axial images were obtained from the base of the skull through the vertex without intravenous contrast. RADIATION DOSE REDUCTION: This exam was performed according to the departmental dose-optimization program which includes automated exposure control, adjustment of the mA and/or kV according to patient size and/or use of iterative reconstruction technique. COMPARISON:  Prior study from 11/29/2022. FINDINGS: Brain: Generalized age-related cerebral volume loss with chronic small vessel ischemic disease. Few scattered remote cerebellar infarcts noted. No acute intracranial hemorrhage. No acute large vessel territory infarct. No mass lesion or midline shift. Mild ventricular prominence related to global parenchymal volume loss without hydrocephalus. No extra-axial fluid collection. Vascular: No abnormal hyperdense vessel. Calcified atherosclerosis present at the skull base. Skull: Scalp soft tissues demonstrate no acute finding. Calvarium intact. Sinuses/Orbits: Globes and orbital soft tissues within normal limits. Scattered mucosal thickening noted within the ethmoidal air cells with small volume layering secretions within the left maxillary sinus. No mastoid effusion. Other: None. ASPECTS [facility] Stroke Program Early CT Score) - Ganglionic level infarction (caudate, lentiform nuclei, internal capsule, insula, M1-M3 cortex): 7 - Supraganglionic infarction (M4-M6 cortex): 3 Total score (0-10 with 10 being normal):  10 IMPRESSION: 1. No acute intracranial abnormality. 2. ASPECTS is 10. 3. Age-related cerebral atrophy with chronic small vessel ischemic disease, with a few scattered chronic cerebellar infarcts. These results were communicated to Dr. Iver Nestle at 8:53 pm on 12/19/2022 by text page via the [facility] messaging system. Electronically Signed   By: Rise Mu M.D.   On: 12/19/2022 20:55     Scheduled Meds:  aspirin EC  81 mg Oral Daily   clonazePAM  1 mg Oral BID   folic acid  1 mg Oral Daily   gabapentin  300 mg Oral TID   lacosamide  150 mg Oral BID   levETIRAcetam  1,500 mg Oral BID   mometasone-formoterol  2 puff Inhalation BID   multivitamin  1 tablet Oral Daily   pantoprazole  40 mg Oral Daily   PHENobarbital  64.8 mg Oral BID   rosuvastatin  10 mg Oral Daily   [START ON 12/28/2022] thiamine (VITAMIN B1) injection  100 mg Intravenous Daily   thiamine  100 mg Intravenous Daily   Continuous Infusions:  thiamine (VITAMIN B1) injection Stopped (12/20/22 0650)   Followed by   Melene Muller ON 12/22/2022] thiamine (VITAMIN B1) injection       LOS: 0 days   Time spent: 87  Rhetta Mura, MD [facility] To contact the attending provider between 7A-7P or the covering provider during after hours 7P-7A, please log into the web site [email or web address] and access using universal [place] password for that web site. If you do not have the password, please call the hospital operator.  12/20/2022, 7:20 AM

## 2022-12-20 NOTE — ED Notes (Signed)
Took over care of this patient from [place]. She noticed he CBG running low. Said I should check it soon. I did and it was 66. I informed Dr. Arlean Hopping. Order for D50 promptly received. Awaiting on pharmacy approval, patient is sleeping and is not symptomatic.

## 2022-12-20 NOTE — ED Notes (Signed)
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 1-4 mg (has no administration in time range)  gabapentin (NEURONTIN) capsule 300 mg (300 mg Oral Given 12/20/22 1516)  acetaminophen (TYLENOL) tablet 650 mg (has no administration in time range)    Or  acetaminophen (TYLENOL) suppository 650 mg (has no administration in time range)  melatonin tablet 3 mg (has no administration in time range)  ondansetron (ZOFRAN) injection 4 mg (has no administration in time range)  folic acid (FOLVITE) tablet 1 mg (1 mg Oral Given 12/20/22 0933)  multivitamin (PROSIGHT) tablet 1 tablet (1 tablet Oral Given 12/20/22 1016)  dextrose 50 % solution 50 mL (50 mLs Intravenous Given 12/20/22 0742)  nicotine (NICODERM CQ - dosed in mg/24 hours) patch 14 mg (has no administration in time range)  albuterol (PROVENTIL) (2.5 MG/3ML) 0.083% nebulizer  solution 2.5 mg (has no administration in time range)  aspirin EC tablet 81 mg (81 mg Oral Given 12/20/22 0934)  mometasone-formoterol (DULERA) 200-5 MCG/ACT inhaler 2 puff (2 puffs Inhalation Not Given 12/20/22 0843)  pantoprazole (PROTONIX) EC tablet 40 mg (40 mg Oral Given 12/20/22 0934)  rosuvastatin (CRESTOR) tablet 10 mg (10 mg Oral Given 12/20/22 0933)  dextrose 5 % solution ( Intravenous Restarted 12/20/22 1355)  levETIRAcetam (KEPPRA) tablet 2,000 mg (has no administration in time range)  lacosamide (VIMPAT) tablet 100 mg (has no administration in time range)  sodium chloride flush (NS) 0.9 % injection 3 mL (3 mLs Intravenous Given 12/19/22 2247)  iohexol (OMNIPAQUE) 350 MG/ML injection 75 mL (75 mLs Intravenous Contrast Given 12/20/22 0006)  dextrose 50 % solution 50 mL (50 mLs Intravenous Given 12/20/22 0342)    Mobility walks     Focused Assessments Neuro Assessment Handoff:  Swallow screen pass? Yes    NIH Stroke Scale  Dizziness Present: No Headache Present: No Interval: Shift assessment Level of Consciousness (1a.)   : Alert, keenly responsive LOC Questions (1b. )   : Answers both questions correctly LOC Commands (1c. )   : Performs both tasks correctly Best Gaze (2. )  : Normal Visual (3. )  : No visual loss Facial Palsy (4. )    :  Normal symmetrical movements Motor Arm, Left (5a. )   : No drift Motor Arm, Right (5b. ) : No drift Motor Leg, Left (6a. )  : No drift Motor Leg, Right (6b. ) : No drift Limb Ataxia (7. ): Absent Sensory (8. )  : Normal, no sensory loss Best Language (9. )  : No aphasia Dysarthria (10. ): Normal Extinction/Inattention (11.)   : No Abnormality Complete NIHSS TOTAL: 0 Last date known well: 12/19/22 Last time known well: 1830 Neuro Assessment: Within Defined Limits Neuro Checks:   Initial (12/19/22 2030)  Has TPA been given? No If patient is a Neuro Trauma and patient is going to OR before floor call report to 4N Charge nurse:  [phone] or [phone]   R Recommendations: See Admitting Provider Note  Report given to:   Additional Notes:

## 2022-12-20 NOTE — ED Notes (Signed)
MD at bedside. 

## 2022-12-20 NOTE — ED Notes (Addendum)
Samtani MD paged regarding pts repeated low CBG. MD aware of med admin, states he will be rounding and at bedside soon. Pt awake, a/o x4, able to answer questions appropriately. See MAR for orders.

## 2022-12-21 DIAGNOSIS — R569 Unspecified convulsions: Secondary | ICD-10-CM | POA: Diagnosis not present

## 2022-12-21 LAB — CBC
HCT: 36.2 % — ABNORMAL LOW (ref 39.0–52.0)
Hemoglobin: 11.7 g/dL — ABNORMAL LOW (ref 13.0–17.0)
MCH: 29.9 pg (ref 26.0–34.0)
MCHC: 32.3 g/dL (ref 30.0–36.0)
MCV: 92.6 fL (ref 80.0–100.0)
Platelets: 404 10*3/uL — ABNORMAL HIGH (ref 150–400)
RBC: 3.91 MIL/uL — ABNORMAL LOW (ref 4.22–5.81)
RDW: 15.9 % — ABNORMAL HIGH (ref 11.5–15.5)
WBC: 5.5 10*3/uL (ref 4.0–10.5)
nRBC: 0 % (ref 0.0–0.2)

## 2022-12-21 LAB — COMPREHENSIVE METABOLIC PANEL
ALT: 14 U/L (ref 0–44)
AST: 18 U/L (ref 15–41)
Albumin: 2.9 g/dL — ABNORMAL LOW (ref 3.5–5.0)
Alkaline Phosphatase: 140 U/L — ABNORMAL HIGH (ref 38–126)
Anion gap: 7 (ref 5–15)
BUN: 12 mg/dL (ref 8–23)
CO2: 24 mmol/L (ref 22–32)
Calcium: 8.7 mg/dL — ABNORMAL LOW (ref 8.9–10.3)
Chloride: 107 mmol/L (ref 98–111)
Creatinine, Ser: 0.94 mg/dL (ref 0.61–1.24)
GFR, Estimated: >60 mL/min (ref >60)
Glucose, Bld: 96 mg/dL (ref 70–99)
Potassium: 4 mmol/L (ref 3.5–5.1)
Sodium: 138 mmol/L (ref 135–145)
Total Bilirubin: 0.4 mg/dL (ref 0.3–1.2)
Total Protein: 6 g/dL — ABNORMAL LOW (ref 6.5–8.1)

## 2022-12-21 LAB — GLUCOSE, CAPILLARY
Glucose-Capillary: 110 mg/dL — ABNORMAL HIGH (ref 70–99)
Glucose-Capillary: 120 mg/dL — ABNORMAL HIGH (ref 70–99)
Glucose-Capillary: 120 mg/dL — ABNORMAL HIGH (ref 70–99)
Glucose-Capillary: 84 mg/dL (ref 70–99)
Glucose-Capillary: 95 mg/dL (ref 70–99)
Glucose-Capillary: 99 mg/dL (ref 70–99)

## 2022-12-21 NOTE — Progress Notes (Signed)
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 in the remote past Prior CVA PAD + DES popliteal artery 08/2020 Prior orthostatic hypotension with history of falls in the remote past AAA 4 x 4 cm in 2020  Recent hospitalization 4/3-/01/15/2023 with lethargy decreased alertness subtle RUL twitching right facial droop and dysarthria-was treated by neurology for status epilepticus and given IV Keppra IV Ativan and was discharged on Keppra twice daily, Vimpat dose was increased and phenobarbital was added in addition to clonazepam on that discharge-patient was recommended to go to skilled facility--he decided to go home  Presented back to [facility]-as a code stroke with right sided weakness-TNK not given 2/2 low suspicion CVA-he is previously stayed at [facility] for about 9 months and recently started staying at his sister's place-he had 3 standard drinks yesterday (mixed drinks) and then started feeling "weak"-he told his sister he was not feeling good and came to the emergency room Blood alcohol level on arrival was 437  CBG noted to be persistently low in the 50s and 60s serum ethanol level 134 white count 5000 phenobarbital level 18 UA negative Rest of chemistries normal-noncontrast head CT = no acute intracranial process hemorrhage, CTA head and neck no large vessel occlusion Rx in ED clonazepam Vimpat 150 Keppra 1.5 g phenobarb 64 thiamine 500 IV Neurologist Dr. Dierdre Harness consulted-recommending MRI brain, EEG awaiting serum Keppra serum Vimpat INR B1 B12 levels  Hospital-Problem based course  Acute toxic metabolic  encephalopathy?  Stroke DDx seizure?  Todd's paralysis?  Intoxication EEG 4/23 = left posterior quadrant epilepsy focal with additional cortical dysfunction left hemisphere-maximal left posterior quadrant secondary to structural anomaly-Per epileptologist this is a low potential for seizure recurrence and there were no seizures noted at the time Meds as per Dr. Marletta Lor complains of being sleepy so she will reevaluate tomorrow-much appreciated  Chronic ethanolism Patient if precontemplative will be counseled-he stated that he does not wish any interventions  Profound hypoglycemia Eating and drinking full meals now was hypoglycemic on arrival 4/24 CBGs consistently 80-1 10  Diabetes mellitus-complication of neuropathy History of PAD status post Lisfranc amputation of the left side low blood sugar now resolved Continue gabapentin 300 3 times daily for presumed neuropathy  HFpEF last EF 70-75% 02/2021 In the next day to 2 days once the stroke has been ruled out we will resume Lopressor 50 twice daily Continue ASA 81, Crestor 10  Chronic issues Needs follow-up for AAA, needs follow-up for lung nodules/meningioma brain  DVT prophylaxis: SCD Code Status: Full Family Communication: At patient request called his mother--long discussion with [name] who lives with him.  Will get [facility] and RN to come out and help with med admin Disposition:  Status is: Observation The patient will require care spanning > 2 midnights and should be moved to inpatient because:   Likely d/c am if all looks well     Subjective:  C/o being "sleepy"  No cp fever Asks me if can drink ETOH--Had a long discussion with him about cessation as otherwise Sz will recur   Objective: Vitals:   12/21/22 0310 12/21/22 0758 12/21/22 1135 12/21/22 1518  BP: (!) 142/77 Marland Kitchen)  147/77 136/76 132/74  Pulse: 75 72 71 74  Resp: 16 17 15 14   Temp: 97.6 F (36.4 C) (!) 97.5 F (36.4 C) 98.4 F (36.9 C) 98 F (36.7 C)   TempSrc: Oral Oral Oral Oral  SpO2: 98% 99% 100% 100%  Weight: 79.6 kg       Intake/Output Summary (Last 24 hours) at 12/21/2022 1559 Last data filed at 12/21/2022 1500 Gross per 24 hour  Intake 240 ml  Output 2600 ml  Net -2360 ml   Filed Weights   12/19/22 2000 12/21/22 0310  Weight: 76.4 kg 79.6 kg    Examination:  EOMI NCAT no focal deficit no icterus no pallor Neck soft supple Chest is clear no wheeze rales rhonchi S1-S2 no murmur Abdomen is soft no rebound Left-sided transmetatarsal amputation noted Power 5/5 grossly  Data Reviewed: personally reviewed   CBC    Component Value Date/Time   WBC 5.5 12/21/2022 0422   RBC 3.91 (L) 12/21/2022 0422   HGB 11.7 (L) 12/21/2022 0422   HGB 12.1 (L) 05/08/2018 1317   HCT 36.2 (L) 12/21/2022 0422   HCT 28.2 (L) 02/21/2019 0554   PLT 404 (H) 12/21/2022 0422   PLT 225 05/08/2018 1317   MCV 92.6 12/21/2022 0422   MCV 100 (H) 05/08/2018 1317   MCH 29.9 12/21/2022 0422   MCHC 32.3 12/21/2022 0422   RDW 15.9 (H) 12/21/2022 0422   RDW 17.5 (H) 05/08/2018 1317   LYMPHSABS 1.9 12/20/2022 0513   LYMPHSABS 1.7 05/08/2018 1317   MONOABS 0.8 12/20/2022 0513   EOSABS 0.5 12/20/2022 0513   EOSABS 0.2 05/08/2018 1317   BASOSABS 0.1 12/20/2022 0513   BASOSABS 0.0 05/08/2018 1317      Latest Ref Rng & Units 12/21/2022    4:22 AM 12/20/2022    5:13 AM 12/19/2022    9:53 PM  CMP  Glucose 70 - 99 mg/dL 96  60  70   BUN 8 - 23 mg/dL 12  10  12    Creatinine 0.61 - 1.24 mg/dL 1.61  0.96  0.45   Sodium 135 - 145 mmol/L 138  136  134   Potassium 3.5 - 5.1 mmol/L 4.0  4.3  4.4   Chloride 98 - 111 mmol/L 107  107  109   CO2 22 - 32 mmol/L 24  18    Calcium 8.9 - 10.3 mg/dL 8.7  8.6    Total Protein 6.5 - 8.1 g/dL 6.0  6.1    Total Bilirubin 0.3 - 1.2 mg/dL 0.4  0.5    Alkaline Phos 38 - 126 U/L 140  149    AST 15 - 41 U/L 18  20    ALT 0 - 44 U/L 14  14       Radiology Studies: MR BRAIN WO CONTRAST  Result Date:  12/20/2022 CLINICAL DATA:  Right-sided weakness. EXAM: MRI HEAD WITHOUT CONTRAST TECHNIQUE: Multiplanar, multiecho pulse sequences of the brain and surrounding structures were obtained without intravenous contrast. COMPARISON:  Noncontrast CT head 1 day prior, same-day CT/CTA head and neck, brain MRI 11/29/2022. FINDINGS: Brain: There is no acute intracranial hemorrhage, extra-axial fluid collection, or acute infarct. Parenchymal volume loss with prominence of the ventricular system and extra-axial CSF spaces is unchanged, accelerated for age. There is unchanged disproportionate and asymmetric atrophy of the left hippocampus. The ventricles are stable in size. FLAIR signal abnormality in the periventricular white matter is unchanged, likely reflecting underlying chronic small-vessel ischemic change. Multiple small remote infarcts  in the bilateral cerebellar hemispheres are unchanged. SWI signal dropout overlying the left cerebral hemisphere cortex is unchanged likely reflecting sequela of prior hemorrhage. 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 There is no other mass lesion. There is no mass effect or midline shift. The pituitary and suprasellar region are normal. Vascular: The intracranial flow voids are normal. FLAIR signal abnormality is again seen in the high cervical left internal carotid artery consistent with known pseudoaneurysm seen on prior CTA. Skull and upper cervical spine: Normal marrow signal. Sinuses/Orbits: There is mild mucosal thickening in the paranasal sinuses. There is layering fluid in the left maxillary sinus. The globes and orbits are unremarkable. Other: None. IMPRESSION: Stable noncontrast brain MRI with no acute intracranial pathology. Electronically Signed   By: Lesia Hausen M.D.   On: 12/20/2022 09:35   CT ANGIO HEAD NECK W WO CM (CODE STROKE)  Result Date: 12/20/2022 CLINICAL DATA:  Initial evaluation for neuro deficit, stroke. No other relevant history  provided. EXAM: CT ANGIOGRAPHY HEAD AND NECK WITH AND WITHOUT CONTRAST TECHNIQUE: Multidetector CT imaging of the head and neck was performed using the standard protocol during bolus administration of intravenous contrast. Multiplanar CT image reconstructions and MIPs were obtained to evaluate the vascular anatomy. Carotid stenosis measurements (when applicable) are obtained utilizing NASCET criteria, using the distal internal carotid diameter as the denominator. RADIATION DOSE REDUCTION: This exam was performed according to the departmental dose-optimization program which includes automated exposure control, adjustment of the mA and/or kV according to patient size and/or use of iterative reconstruction technique. CONTRAST:  75mL OMNIPAQUE IOHEXOL 350 MG/ML SOLN COMPARISON:  CT from 12/19/2022. FINDINGS: CTA NECK FINDINGS Aortic arch: Aortic arch within normal limits for caliber with standard branch pattern. Atheromatous change about the arch itself. No stenosis about the origin the great vessels. Right carotid system: Right common and internal carotid arteries are patent without acute dissection. Bulky calcified plaque about the right carotid bulb/proximal right ICA, without hemodynamically significant greater than 50% stenosis, stable. 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 Left carotid system: Left common and internal carotid arteries are patent without evidence for acute dissection. Bulky calcified plaque about the left carotid bulb/proximal left ICA, but without hemodynamically significant greater than 50% stenosis. Chronic pseudoaneurysm involving the cervical left ICA, not significantly changed in appearance mild narrowing of the true lumen at this level, also unchanged. Vertebral arteries: Both vertebral arteries arise from the subclavian arteries. No proximal subclavian artery stenosis. Atheromatous change at the origin of the right  vertebral artery with estimated 30% stenosis, stable. Vertebral arteries patent distally without stenosis or dissection. Skeleton: No discrete or worrisome osseous lesions. Prior ACDF at C3-4. Mild chronic compression deformity of T5 noted. Patient is edentulous. Other neck: No other acute abnormality within the neck. Upper chest: Emphysema.  No acute finding. Review of the MIP images confirms the above findings CTA HEAD FINDINGS Anterior circulation: Atheromatous change about the carotid siphons with associated mild to moderate stenoses, slightly worse on the right, relatively stable from prior. A1 segments patent bilaterally. Normal anterior communicating complex. Both ACAs are patent without stenosis. No M1 stenosis or occlusion. No proximal MCA branch occlusion or high-grade stenosis. Distal MCA branches perfused and symmetric. Diffuse small vessel atheromatous irregularity noted. Posterior circulation: Both V4 segments are patent without significant stenosis. Both PICA patent at their origins. Basilar diminutive but patent to its distal aspect without stenosis. Superior cerebellar arteries patent bilaterally. Fetal type origin of  the PCAs bilaterally. Atheromatous irregularity about the PCAs bilaterally without hemodynamically significant stenosis. Venous sinuses: Grossly patent allowing for timing the contrast bolus. 1.2 cm right parafalcine meningioma without mass effect. Anatomic variants: Fetal type origin of the PCAs with overall diminutive vertebrobasilar system. Review of the MIP images confirms the above findings IMPRESSION: 1. Negative CTA for large vessel occlusion or other emergent finding. 2. Chronic bilateral cervical ICA pseudoaneurysms, stable. 3. Atheromatous change about the carotid bifurcations and carotid siphons without hemodynamically significant greater than 50% stenosis. 4. 30% stenosis at the origin of the right vertebral artery, stable. 5. Fetal type origin of the PCAs with overall  diminutive vertebrobasilar system. Aortic Atherosclerosis (ICD10-I70.0) and Emphysema (ICD10-J43.9). Electronically Signed   By: Rise Mu M.D.   On: 12/20/2022 00:57   EEG adult  Result Date: 12/19/2022 Charlsie Quest, MD     12/19/2022 10:16 PM Patient Name: Seth [name] MRN: [id number] Epilepsy Attending: Charlsie Quest Referring Physician/Provider: Gordy Councilman, MD Date: 12/19/2022 Duration: 22.59 mins Patient history: 67yo M with epilepsy getting eeg to evaluate for seizure Level of alertness: Awake AEDs during EEG study: LEV, LCM, Phenobarb Technical aspects: This EEG study was done with scalp electrodes positioned according to the 10-20 International system of electrode placement. Electrical activity was reviewed with band pass filter of 1-70Hz , sensitivity of 7 uV/mm, display speed of 21mm/sec with a  notched filter applied as appropriate. EEG data were recorded continuously and digitally stored.  Video monitoring was available and reviewed as appropriate. Description: The posterior dominant rhythm consists of 8-9 Hz activity of moderate voltage (25-35 uV) seen predominantly in posterior head regions, asymmetric ( left<right) and reactive to eye opening and eye closing. EEG showed continuous rhythmic 2-3hz  delta slowing in left hemisphere, maximal left posterior quadrant. Sharp waves were noted in left posterior quadrant.  Photic driving was not seen during photic stimulation. Hyperventilation was not performed.   ABNORMALITY -Sharp wave, left posterior quadrant - Lateralized rhythmic delta activity, left hemisphere, maximal left posterior quadrant. 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 Additionally there is cortical dysfunction arising from left hemisphere, maximal left posterior quadrant likely secondary to underlying structural abnormality. This eeg pattern is on the ictal-interictal continuum with  low potential for seizure recurrence. No seizures were seen throughout the recording. If suspicion for ictal activity remains a concern, a prolonged study can be considered. Dr. Iver Nestle was notified. Charlsie Quest   CT HEAD CODE STROKE WO CONTRAST  Result Date: 12/19/2022 CLINICAL DATA:  Code stroke. Initial evaluation for neuro deficit, stroke. EXAM: CT HEAD WITHOUT CONTRAST TECHNIQUE: Contiguous axial images were obtained from the base of the skull through the vertex without intravenous contrast. RADIATION DOSE REDUCTION: This exam was performed according to the departmental dose-optimization program which includes automated exposure control, adjustment of the mA and/or kV according to patient size and/or use of iterative reconstruction technique. COMPARISON:  Prior study from 11/29/2022. FINDINGS: Brain: Generalized age-related cerebral volume loss with chronic small vessel ischemic disease. Few scattered remote cerebellar infarcts noted. No acute intracranial hemorrhage. No acute large vessel territory infarct. No mass lesion or midline shift. Mild ventricular prominence related to global parenchymal volume loss without hydrocephalus. No extra-axial fluid collection. Vascular: No abnormal hyperdense vessel. Calcified atherosclerosis present at the skull base. Skull: Scalp soft tissues demonstrate no acute finding. Calvarium intact. Sinuses/Orbits: Globes and orbital soft tissues within normal limits. Scattered mucosal thickening noted within the ethmoidal  air cells with small volume layering secretions within the left maxillary sinus. No mastoid effusion. Other: None. ASPECTS [facility] Stroke Program Early CT Score) - Ganglionic level infarction (caudate, lentiform nuclei, internal capsule, insula, M1-M3 cortex): 7 - Supraganglionic infarction (M4-M6 cortex): 3 Total score (0-10 with 10 being normal): 10 IMPRESSION: 1. No acute intracranial abnormality. 2. ASPECTS is 10. 3. Age-related cerebral atrophy with  chronic small vessel ischemic disease, with a few scattered chronic cerebellar infarcts. These results were communicated to Dr. Iver Nestle at 8:53 pm on 12/19/2022 by text page via the [facility] messaging system. Electronically Signed   By: Rise Mu M.D.   On: 12/19/2022 20:55     Scheduled Meds:  aspirin EC  81 mg Oral Daily   clonazePAM  1 mg Oral BID   folic acid  1 mg Oral Daily   gabapentin  300 mg Oral TID   lacosamide  100 mg Oral BID   levETIRAcetam  2,000 mg Oral BID   mometasone-formoterol  2 puff Inhalation BID   multivitamin  1 tablet Oral Daily   pantoprazole  40 mg Oral Daily   PHENobarbital  64.8 mg Oral BID   rosuvastatin  10 mg Oral Daily   [START ON 12/28/2022] thiamine (VITAMIN B1) injection  100 mg Intravenous Daily   Continuous Infusions:  [START ON 12/22/2022] thiamine (VITAMIN B1) injection       LOS: 1 day   Time spent: 76  Rhetta Mura, MD [facility] To contact the attending provider between 7A-7P or the covering provider during after hours 7P-7A, please log into the web site [email or web address] and access using universal [place] password for that web site. If you do not have the password, please call the hospital operator.  12/21/2022, 3:59 PM

## 2022-12-21 NOTE — Progress Notes (Signed)
   12/21/22 1142  SDOH Interventions  Food Insecurity Interventions Inpatient TOC;Other (Comment) (Denied present concerns)   CSW met with pt at bedside to complete SDOH screen and SU consult. Pt notes he currently stays with his mom and sister, he has enough food at this time. Pt states he was recently at [facility]. Pt states transportation is an issue, but he is working with a provider to resolve that. Pt is attempting to get in with [facility]. Pt states he has 3 shots every other day. CSW and pt discussed the health benefits of cutting down and how his health may be impacted by extended usage. Pt noted understanding and states he does not believe he will have a problem cutting back. Pt declines resources at this time. TOC is available for any further needs.

## 2022-12-21 NOTE — Plan of Care (Signed)

## 2022-12-22 ENCOUNTER — Other Ambulatory Visit (HOSPITAL_COMMUNITY): Payer: Self-pay

## 2022-12-22 DIAGNOSIS — R569 Unspecified convulsions: Secondary | ICD-10-CM | POA: Diagnosis not present

## 2022-12-22 LAB — CBC
HCT: 43.1 % (ref 39.0–52.0)
Hemoglobin: 13.5 g/dL (ref 13.0–17.0)
MCH: 29.3 pg (ref 26.0–34.0)
MCHC: 31.3 g/dL (ref 30.0–36.0)
MCV: 93.7 fL (ref 80.0–100.0)
Platelets: 361 10*3/uL (ref 150–400)
RBC: 4.6 MIL/uL (ref 4.22–5.81)
RDW: 16.4 % — ABNORMAL HIGH (ref 11.5–15.5)
WBC: 6.3 10*3/uL (ref 4.0–10.5)
nRBC: 0 % (ref 0.0–0.2)

## 2022-12-22 LAB — COMPREHENSIVE METABOLIC PANEL
ALT: 18 U/L (ref 0–44)
AST: 23 U/L (ref 15–41)
Albumin: 3.3 g/dL — ABNORMAL LOW (ref 3.5–5.0)
Alkaline Phosphatase: 158 U/L — ABNORMAL HIGH (ref 38–126)
Anion gap: 13 (ref 5–15)
BUN: 13 mg/dL (ref 8–23)
CO2: 16 mmol/L — ABNORMAL LOW (ref 22–32)
Calcium: 9.1 mg/dL (ref 8.9–10.3)
Chloride: 107 mmol/L (ref 98–111)
Creatinine, Ser: 0.92 mg/dL (ref 0.61–1.24)
GFR, Estimated: >60 mL/min (ref >60)
Glucose, Bld: 73 mg/dL (ref 70–99)
Potassium: 4.5 mmol/L (ref 3.5–5.1)
Sodium: 136 mmol/L (ref 135–145)
Total Bilirubin: 0.5 mg/dL (ref 0.3–1.2)
Total Protein: 6.7 g/dL (ref 6.5–8.1)

## 2022-12-22 LAB — GLUCOSE, CAPILLARY
Glucose-Capillary: 100 mg/dL — ABNORMAL HIGH (ref 70–99)
Glucose-Capillary: 112 mg/dL — ABNORMAL HIGH (ref 70–99)
Glucose-Capillary: 61 mg/dL — ABNORMAL LOW (ref 70–99)
Glucose-Capillary: 86 mg/dL (ref 70–99)
Glucose-Capillary: 98 mg/dL (ref 70–99)

## 2022-12-22 LAB — LEVETIRACETAM LEVEL: Levetiracetam Lvl: <2 ug/mL — ABNORMAL LOW (ref 10.0–40.0)

## 2022-12-22 MED ORDER — LEVETIRACETAM 1000 MG PO TABS
2000.0000 mg | ORAL_TABLET | Freq: Two times a day (BID) | ORAL | 2 refills | Status: DC
  Filled 2022-12-22: qty 120, 30d supply, fill #0

## 2022-12-22 MED ORDER — MELATONIN 3 MG PO TABS: 3.0000 mg | ORAL_TABLET | Freq: Every evening | ORAL | 0 refills | Status: DC | PRN

## 2022-12-22 MED ORDER — METOPROLOL TARTRATE 12.5 MG HALF TABLET
12.5000 mg | ORAL_TABLET | Freq: Two times a day (BID) | ORAL | Status: DC
  Administered 2022-12-22 – 2022-12-24 (×4): 12.5 mg via ORAL
  Filled 2022-12-22 (×4): qty 1

## 2022-12-22 MED ORDER — LACOSAMIDE 100 MG PO TABS
100.0000 mg | ORAL_TABLET | Freq: Two times a day (BID) | ORAL | 2 refills | Status: DC
  Filled 2022-12-22: qty 60, 30d supply, fill #0

## 2022-12-22 MED ORDER — CLONAZEPAM 1 MG PO TABS: 1.0000 mg | ORAL_TABLET | Freq: Two times a day (BID) | ORAL | 1 refills | Status: DC

## 2022-12-22 MED ORDER — GABAPENTIN 300 MG PO CAPS
300.0000 mg | ORAL_CAPSULE | Freq: Three times a day (TID) | ORAL | 1 refills | Status: DC
  Filled 2022-12-22: qty 90, 30d supply, fill #0

## 2022-12-22 MED ORDER — NICOTINE 14 MG/24HR TD PT24
14.0000 mg | MEDICATED_PATCH | Freq: Every day | TRANSDERMAL | 0 refills | Status: DC | PRN
  Filled 2022-12-22: qty 28, 28d supply, fill #0

## 2022-12-22 NOTE — TOC Transition Note (Addendum)
Transition of Care (TOC) - CM/SW Discharge Note Donn Pierini RN,BSN Transitions of Care Unit 4NP (Non Trauma)- RN Case Manager See Treatment Team for direct Phone #   Patient Details  Name: Seth [name] MRN: [id number] Date of Birth: Mar 22, 1956  Transition of Care [facility]) CM/SW Contact:  Darrold Span, RN Phone Number: 12/22/2022, 4:22 PM   Clinical Narrative:    Pt stable for transition home, orders placed for [employer] needs RN/PT/OT.   CM in to speak with pt at bedside, pt able to confirm correct address in chart, PCP and phone #s. Per pt he lives with mom and sister. CM offered to call sister, however pt states he will call her when he is ready to be picked up to transport homes as per pt sister will provide transportation home.   Discussed with pt HH- pt voiced he has not had HH in past, he states he has been to Rehab- note recent stay at [facility].  List provided to pt for [facility] choice- Per CMS guidelines from [email or web address] website with star ratings (copy placed in shadow chart)- pt states he really does not have a preference and will defer to CM to secure on his behalf.   DME reviewed with pt who voiced he has cane, RW, Wheelchair at home- no new needs noted.   Call made to [facility] for [facility] referral- awaiting for them to review to see if they can staff for needs.   1645- msg received from bedside RN that sister is refusing to take pt home at this time and is requesting SNF again. PT eval still pending, OT has recommended HH. Will hold d/c for now until PT eval completed - will have TOC follow up tomorrow- if SNF recommendations made then will f/u for SNF, however if not then will have to re-address pt going home w/ family. Per bedside RN pt agreeable at this time to SNF.  Attending MD aware of situation.     Final next level of care: Home w Home Health Services     Patient Goals and CMS Choice CMS Medicare.gov Compare Post Acute Care list provided to::  Patient Choice offered to / list presented to : Patient  Discharge Placement                         Discharge Plan and Services Additional resources added to the After Visit Summary for     Discharge Planning Services: CM Consult Post Acute Care Choice: Home Health          DME Arranged: N/A DME Agency: NA       HH Arranged: RN, Disease Management, PT, OT   Date HH Agency Contacted: 12/22/22 Time HH Agency Contacted: 1530    Social Determinants of Health (SDOH) Interventions SDOH Screenings   Food Insecurity: Food Insecurity Present (11/29/2022)  Housing: Low Risk  (11/29/2022)  Transportation Needs: Unmet Transportation Needs (11/29/2022)  Utilities: Not At Risk (11/29/2022)  Depression (PHQ2-9): Low Risk  (08/23/2020)  Financial Resource Strain: Medium Risk (12/07/2017)  Stress: Stress Concern Present (12/07/2017)  Tobacco Use: High Risk (12/20/2022)     Readmission Risk Interventions    12/22/2022    4:22 PM  Readmission Risk Prevention Plan  Transportation Screening Complete  HRI or Home Care Consult Complete  Social Work Consult for Recovery Care Planning/Counseling Complete  Palliative Care Screening Not Applicable  Medication Review Oceanographer) Complete

## 2022-12-22 NOTE — Evaluation (Signed)
Occupational Therapy Evaluation Patient Details Name: Seth [name] MRN: [id number] DOB: Jun 04, 1956 Today's Date: 12/22/2022   History of Present Illness Seth [name] is a 67 year old male admitted with with right-sided weakness and intoxication with alcohol level of 134. Prior history of left frontal stroke, epilepsy, alcohol use disorder, LLE transmetatarsal amputation. Right hemiparesis worse likely due to recrudescence of old stroke symptoms in the setting of alcohol intoxication versus Todd's paralysis.   Clinical Impression   Pt currently presents at a min assist level for selfcare tasks and functional transfers with use of the RW for support.  Vitals stable throughout session with pt demonstrating decreased awareness of day of the week, month, and reason for hospitalization.  Feel he will benefit from acute care OT at this time as he was independent with his cane for mobility at home and needed some assist for bathing and dressing.  He reports that his daughter can provide 24 hr supervision post discharge.        Recommendations for follow up therapy are one component of a multi-disciplinary discharge planning process, led by the attending physician.  Recommendations may be updated based on patient status, additional functional criteria and insurance authorization.   Assistance Recommended at Discharge Frequent or constant Supervision/Assistance  Patient can return home with the following A little help with bathing/dressing/bathroom;Assistance with cooking/housework;Help with stairs or ramp for entrance;Direct supervision/assist for medications management;A little help with walking and/or transfers;Assist for transportation;Direct supervision/assist for financial management    Functional Status Assessment  Patient has had a recent decline in their functional status and demonstrates the ability to make significant improvements in function in a reasonable and predictable amount of time.   Equipment Recommendations  None recommended by OT       Precautions / Restrictions Precautions Precautions: Fall;Other (comment) Precaution Comments: seizures, mild right hemiparesis, LLE transmet amputation Restrictions Weight Bearing Restrictions: No      Mobility Bed Mobility Overal bed mobility: Needs Assistance Bed Mobility: Supine to Sit     Supine to sit: Supervision, HOB elevated          Transfers Overall transfer level: Needs assistance Equipment used: Rolling Kinlaw (2 wheels) Transfers: Sit to/from Stand Sit to Stand: Min assist     Step pivot transfers: Min guard     General transfer comment: Pt needed min instructional cueing for hand position with sit to stand.      Balance Overall balance assessment: Needs assistance Sitting-balance support: Feet supported, No upper extremity supported Sitting balance-[name] Scale: Good     Standing balance support: During functional activity, Bilateral upper extremity supported Standing balance-[name] Scale: Poor Standing balance comment: Pt needs BUE support for standing                           ADL either performed or assessed with clinical judgement   ADL Overall ADL's : Needs assistance/impaired     Grooming: Set up;Sitting Grooming Details (indicate cue type and reason): simulated Upper Body Bathing: Set up;Sitting Upper Body Bathing Details (indicate cue type and reason): simulated Lower Body Bathing: Minimal assistance;Sit to/from stand Lower Body Bathing Details (indicate cue type and reason): simulated     Lower Body Dressing: Minimal assistance;Sit to/from stand Lower Body Dressing Details (indicate cue type and reason): simulated Toilet Transfer: Minimal assistance;Ambulation;BSC/3in1;Rolling Raker (2 wheels)   Toileting- Clothing Manipulation and Hygiene: Minimal assistance;Sit to/from stand Toileting - Clothing Manipulation Details (indicate cue type and reason): simulated  Functional mobility during ADLs: Minimal assistance;Rolling Venard (2 wheels) General ADL Comments: Without use of an assistive device, pt needed max assist to achieve and maintain standing from the EOB secondary to posterior lean.  With use of the RW he was able to stand and complete functional mobility with use of the RW for support.  He reports having his daughter who can provide 24 hour supervision/assist.     Vision Baseline Vision/History: 1 Wears glasses (did not have them present with him) Ability to See in Adequate Light: 0 Adequate Patient Visual Report: No change from baseline Vision Assessment?: No apparent visual deficits     Perception  Not tested   Praxis  [facility]    Pertinent Vitals/Pain Pain Assessment Pain Assessment: No/denies pain     Hand Dominance Left   Extremity/Trunk Assessment Upper Extremity Assessment Upper Extremity Assessment: RUE deficits/detail RUE Deficits / Details: Hx of CVA with residual weakness but shoulder flexion 0-90 degrees with slight synergy pattern.  Isolated AROM movements at the elbow, wrist, and digits.  Increased arthritic changes along with residual CVA deficits in the hand.  Decreased PROM 5th digit PIP and decreeased full digit extension throughout. RUE Sensation: WNL RUE Coordination: decreased fine motor;decreased gross motor   Lower Extremity Assessment Lower Extremity Assessment: Defer to PT evaluation   Cervical / Trunk Assessment Cervical / Trunk Assessment: Kyphotic   Communication Communication Communication: No difficulties   Cognition Arousal/Alertness: Awake/alert Behavior During Therapy: WFL for tasks assessed/performed Overall Cognitive Status: No family/caregiver present to determine baseline cognitive functioning                                 General Comments: Pt not oriented to month or day of the week.  He also did not know why he was here he stated his bloodsugar got low.                 Home Living Family/patient expects to be discharged to:: Private residence Living Arrangements: Parent;Other relatives (mother and sister) Available Help at Discharge: Family Type of Home: House Home Access: Stairs to enter [employer] of Steps: " a few" Entrance Stairs-Rails: Left Home Layout: Multi-level Alternate Level Stairs-Number of Steps: flight down to the basement, pt has a suite in basement. Alternate Level Stairs-Rails: Left Bathroom Shower/Tub: Tub/shower unit   Bathroom Toilet: Standard Bathroom Accessibility: Yes   Home Equipment: Cane - single point;Rolling Gassmann (2 wheels);Shower seat;Grab bars - toilet;Grab bars - tub/shower;BSC/3in1   Additional Comments: grab bars on left hand side of toilet      Prior Functioning/Environment Prior Level of Function : Needs assist       Physical Assist : ADLs (physical)   ADLs (physical): Dressing;Bathing Mobility Comments: Reports that he uses a SPC ADLs Comments: says he was able to do most of his bathing and dressing, his daughter would help sometimes with dressing        OT Problem List: Decreased safety awareness;Decreased activity tolerance;Impaired balance (sitting and/or standing);Decreased knowledge of use of DME or AE;Decreased strength;Impaired UE functional use      OT Treatment/Interventions: Self-care/ADL training;Therapeutic exercise;Therapeutic activities;Neuromuscular education;DME and/or AE instruction;Patient/family education;Balance training;Cognitive remediation/compensation    OT Goals(Current goals can be found in the care plan section) Acute Rehab OT Goals Patient Stated Goal: Pt did not state during session but agreeable to participation in therapy. OT Goal Formulation: With patient Time For Goal Achievement: 01/05/23 Potential to Achieve  Goals: Good  OT Frequency: Min 2X/week       AM-PAC OT "6 Clicks" Daily Activity     Outcome Measure Help from another person eating  meals?: None Help from another person taking care of personal grooming?: A Little Help from another person toileting, which includes using toliet, bedpan, or urinal?: A Little Help from another person bathing (including washing, rinsing, drying)?: A Little Help from another person to put on and taking off regular upper body clothing?: A Little Help from another person to put on and taking off regular lower body clothing?: A Little 6 Click Score: 19   End of Session Equipment Utilized During Treatment: Gait belt;Rolling Rossano (2 wheels) Nurse Communication: Mobility status  Activity Tolerance: Patient tolerated treatment well Patient left: in chair;with call bell/phone within reach;with chair alarm set  OT Visit Diagnosis: History of falling (Z91.81);Muscle weakness (generalized) (M62.81);Unsteadiness on feet (R26.81);Hemiplegia and hemiparesis;Other abnormalities of gait and mobility (R26.89) Hemiplegia - Right/Left: Right Hemiplegia - dominant/non-dominant: Non-Dominant Hemiplegia - caused by: Cerebral infarction                Time: 1200-1240 OT Time Calculation (min): 40 min Charges:  OT General Charges $OT Visit: 1 Visit OT Evaluation $OT Eval Moderate Complexity: 1 Mod OT Treatments $Self Care/Home Management : 23-37 mins  Perrin Maltese, OTR/L Acute Rehabilitation Services  Office [phone] 12/22/2022

## 2022-12-22 NOTE — Progress Notes (Signed)
Spoke with [name], patient's sister. She does not want to take the patient home because she can not care for him. Dr. Mahala Menghini notified as well as case manager.

## 2022-12-22 NOTE — Hospital Course (Signed)
1. Clonazepam 1mg  BID             2. Gabapentin 300mg  TID             3. Phenobarb 65mg  BID             4. Keppra 2000mg  BID             5. Vimpat 100mg  BID

## 2022-12-22 NOTE — Progress Notes (Signed)
Pt has order to discharge home, pt's primary RN, Lelon Mast has attempted to contact pt's sister, whom he lives with, she did not answer, a message was left on her voice mail that pt was discharging home and needed her to transport pt home.     Pt states he has tried to call her also, he states he was able to reach his daughter, but she is currently a patient here in the hospital. Encouraged pt to continuing trying to call his sister and leave a message on her voice mail.  [facility] filled pt's scripts, with one script being a printed script that pt will need to get filled at his pharmacy when refill is needed.  Pt was on this medication (clonazepam/klonopin) at home already.  Filled scripts were delivered to pt's room.

## 2022-12-22 NOTE — Progress Notes (Signed)
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 in the remote past Prior CVA PAD + DES popliteal artery 08/2020 Prior orthostatic hypotension with history of falls in the remote past AAA 4 x 4 cm in 2020  Recent hospitalization 4/3-/01/15/2023 with lethargy decreased alertness subtle RUL twitching right facial droop and dysarthria-was treated by neurology for status epilepticus and given IV Keppra IV Ativan and was discharged on Keppra twice daily, Vimpat dose was increased and phenobarbital was added in addition to clonazepam on that discharge-patient was recommended to go to skilled facility--he decided to go home  Presented back to [facility]-as a code stroke with right sided weakness-TNK not given 2/2 low suspicion CVA-he is previously stayed at [facility] for about 9 months and recently started staying at his sister's place-he had 3 standard drinks yesterday (mixed drinks) and then started feeling "weak"-he told his sister he was not feeling good and came to the emergency room Blood alcohol level on arrival was 437  CBG noted to be persistently low in the 50s and 60s serum ethanol level 134 white count 5000 phenobarbital level 18 UA negative Rest of chemistries normal-noncontrast head CT = no acute intracranial process hemorrhage, CTA head and neck no large vessel occlusion Rx in ED clonazepam Vimpat 150 Keppra 1.5 g phenobarb 64 thiamine 500 IV Neurologist Dr. Dierdre Harness consulted-recommending MRI brain, EEG awaiting serum Keppra serum Vimpat INR B1 B12 levels  Hospital-Problem based course  Acute toxic metabolic  encephalopathy?  Stroke DDx seizure?  Todd's paralysis?  Intoxication--stroke ruled out on CTs on admission EEG 4/23 = left posterior quadrant epilepsy focal with additional cortical dysfunction left hemisphere-maximal left posterior quadrant secondary to structural anomaly-Per epileptologist low potential for seizure recurrence but must remain on meds Discharge meds as per Dr. Melynda Ripple Continue anti seizure meds as follows             1. Clonazepam 1mg  BID             2. Gabapentin 300mg  TID             3. Phenobarb 65mg  BID             4. Keppra 2000mg  BID             5. Vimpat 100mg  BID  Chronic ethanolism Patient if precontemplative will be counseled-he stated that he does not wish any interventions  Profound hypoglycemia Eating and drinking full meals now was hypoglycemic on arrival 4/24 CBGs consistently 80-120  Diabetes mellitus-complication of neuropathy History of PAD status post Lisfranc amputation of the left side low blood sugar now resolved Continue gabapentin 300 3 times daily for presumed neuropathy  HFpEF last EF 70-75% 02/2021 Resume Lopressor but lower than pPTa @ 12.5 twice daily Continue ASA 81, Crestor 10  Chronic issues Needs follow-up for AAA, needs follow-up for lung nodules/meningioma brain  DVT prophylaxis: SCD Code Status: Full Family Communication: At patient request called his mother--long discussion with [name] who lives with him.  Will get [facility] and RN to come out and help with med admin Disposition:  Status is: Observation The patient will require care spanning > 2 midnights and should  be moved to inpatient because:   Likely d/c am if all looks well     Subjective:  Wake alert oriented-Much more comfortable appearing No chest pain He was sleepy We had a long discussion about him not using alcohol and avoiding it and I explained to him he needs all of the meds even though they make him sleepy as they are needed to prevent him from having  seizures and that hopefully he will get better  Objective: Vitals:   12/22/22 1300 12/22/22 1400 12/22/22 1509 12/22/22 1600  BP: (!) 146/76 137/73 124/75 134/79  Pulse:   73 70  Resp: 20 16 17 19   Temp:   97.6 F (36.4 C)   TempSrc:   Oral   SpO2:   96% 99%  Weight:        Intake/Output Summary (Last 24 hours) at 12/22/2022 1657 Last data filed at 12/22/2022 1150 Gross per 24 hour  Intake 4066.18 ml  Output 400 ml  Net 3666.18 ml    Filed Weights   12/19/22 2000 12/21/22 0310 12/22/22 0500  Weight: 76.4 kg 79.6 kg 79.5 kg    Examination:  EOMI NCAT no focal deficit no icterus no pallor Neck soft supple Chest is clear no wheeze rales rhonchi S1-S2 no murmur Moving all 4 limbs equally without deficit  Data Reviewed: personally reviewed   CBC    Component Value Date/Time   WBC 6.3 12/22/2022 0827   RBC 4.60 12/22/2022 0827   HGB 13.5 12/22/2022 0827   HGB 12.1 (L) 05/08/2018 1317   HCT 43.1 12/22/2022 0827   HCT 28.2 (L) 02/21/2019 0554   PLT 361 12/22/2022 0827   PLT 225 05/08/2018 1317   MCV 93.7 12/22/2022 0827   MCV 100 (H) 05/08/2018 1317   MCH 29.3 12/22/2022 0827   MCHC 31.3 12/22/2022 0827   RDW 16.4 (H) 12/22/2022 0827   RDW 17.5 (H) 05/08/2018 1317   LYMPHSABS 1.9 12/20/2022 0513   LYMPHSABS 1.7 05/08/2018 1317   MONOABS 0.8 12/20/2022 0513   EOSABS 0.5 12/20/2022 0513   EOSABS 0.2 05/08/2018 1317   BASOSABS 0.1 12/20/2022 0513   BASOSABS 0.0 05/08/2018 1317      Latest Ref Rng & Units 12/22/2022    8:27 AM 12/21/2022    4:22 AM 12/20/2022    5:13 AM  CMP  Glucose 70 - 99 mg/dL 73  96  60   BUN 8 - 23 mg/dL 13  12  10    Creatinine 0.61 - 1.24 mg/dL 1.61  0.96  0.45   Sodium 135 - 145 mmol/L 136  138  136   Potassium 3.5 - 5.1 mmol/L 4.5  4.0  4.3   Chloride 98 - 111 mmol/L 107  107  107   CO2 22 - 32 mmol/L 16  24  18    Calcium 8.9 - 10.3 mg/dL 9.1  8.7  8.6   Total Protein 6.5 - 8.1 g/dL 6.7  6.0  6.1   Total Bilirubin 0.3 - 1.2 mg/dL  0.5  0.4  0.5   Alkaline Phos 38 - 126 U/L 158  140  149   AST 15 - 41 U/L 23  18  20    ALT 0 - 44 U/L 18  14  14       Radiology Studies: No results found.   Scheduled Meds:  aspirin EC  81 mg Oral Daily   clonazePAM  1 mg Oral BID   folic acid  1 mg Oral Daily  gabapentin  300 mg Oral TID   lacosamide  100 mg Oral BID   levETIRAcetam  2,000 mg Oral BID   metoprolol tartrate  12.5 mg Oral BID   mometasone-formoterol  2 puff Inhalation BID   multivitamin  1 tablet Oral Daily   pantoprazole  40 mg Oral Daily   PHENobarbital  64.8 mg Oral BID   rosuvastatin  10 mg Oral Daily   [START ON 12/28/2022] thiamine (VITAMIN B1) injection  100 mg Intravenous Daily   Continuous Infusions:  thiamine (VITAMIN B1) injection 250 mg (12/22/22 0820)     LOS: 2 days   Time spent: 55  Rhetta Mura, MD [facility] To contact the attending provider between 7A-7P or the covering provider during after hours 7P-7A, please log into the web site [email or web address] and access using universal [place] password for that web site. If you do not have the password, please call the hospital operator.  12/22/2022, 4:57 PM

## 2022-12-22 NOTE — Progress Notes (Signed)
Subjective: NAEO. Denies any concerns  ROS: negative except above  Examination  Vital signs in last 24 hours: Temp:  [97.5 F (36.4 C)-98.4 F (36.9 C)] 97.6 F (36.4 C) (04/26 0737) Pulse Rate:  [66-83] 78 (04/26 0737) Resp:  [14-20] 20 (04/26 0737) BP: (103-136)/(49-82) 129/82 (04/26 0737) SpO2:  [93 %-100 %] 99 % (04/26 0814) Weight:  [79.5 kg] 79.5 kg (04/26 0500)  General: sitting in bed, NAD Neuro: MS: Alert, oriented, follows commands CN: pupils equal and reactive,  EOMI, face symmetric, tongue midline, normal sensation over face, Motor: 5/5 strength in all 4 extremities Reflexes: 2+ bilaterally over patella, biceps, plantars: flexor Coordination: normal Gait: not tested  Basic Metabolic Panel: Recent Labs  Lab 12/19/22 2150 12/19/22 2153 12/20/22 0513 12/20/22 01-22-2117 12/21/22 0422 12/22/22 0827  NA 138 134* 136  --  138 136  K 4.4 4.4 4.3  --  4.0 4.5  CL 104 109 107  --  107 107  CO2 17*  --  18*  --  24 16*  GLUCOSE 70 70 60*  --  96 73  BUN 11 12 10   --  12 13  CREATININE 0.87 1.00 0.88  --  0.94 0.92  CALCIUM 9.3  --  8.6*  --  8.7* 9.1  MG  --   --  2.2 2.0  --   --     CBC: Recent Labs  Lab 12/19/22 2149/01/22 12/19/22 Jan 23, 2152 12/20/22 0513 12/21/22 0422 12/22/22 0827  WBC 5.6  --  5.4 5.5 6.3  NEUTROABS 2.4  --  2.0  --   --   HGB 12.8* 14.3 12.4* 11.7* 13.5  HCT 42.4 42.0 39.3 36.2* 43.1  MCV 99.8  --  93.1 92.6 93.7  PLT 251  --  359 404* 361     Coagulation Studies: Recent Labs    12/20/22 01/22/2117  LABPROT 14.0  INR 1.1    Imaging No new brain imaging  ASSESSMENT AND PLAN: 67 year old male with prior history of left frontal stroke, epilepsy, alcohol use disorder with alcohol intoxication who presented with right-sided weakness, alcohol level 134 on arrival.   Chronic stroke Epilepsy Alcohol use disorder Right) (improving) -No seizures overnight.  Right-sided weakness improving.  Mental status improving. -Right hemiparesis worse  likely due to recrudescence of old stroke symptoms in the setting of alcohol intoxication versus Todd's paralysis (although no visible seizures)   Recommendations -Continue anti seizure meds as follows  1. Clonazepam 1mg  BID  2. Gabapentin 300mg  TID  3. Phenobarb 65mg  BID  4. Keppra 2000mg  BID  5. Vimpat 100mg  BID -Alcohol cessation counseling -Continue seizure precautions including do not drive -Follow-up with Dr Teresa Coombs in 4-6 weeks -Discussed plan with Dr. Mahala Menghini via secure chat  Seizure precautions: Per [facility] statutes, patients with seizures are not allowed to drive until they have been seizure-free for six months and cleared by a physician    Use caution when using heavy equipment or power tools. Avoid working on ladders or at heights. Take showers instead of baths. Ensure the water temperature is not too high on the home water heater. Do not go swimming alone. Do not lock yourself in a room alone (i.e. bathroom). When caring for infants or small children, sit down when holding, feeding, or changing them to minimize risk of injury to the child in the event you have a seizure. Maintain good sleep hygiene. Avoid alcohol.    If patient has another seizure, call 911 and bring them  back to the ED if: A.  The seizure lasts longer than 5 minutes.      B.  The patient doesn't wake shortly after the seizure or has new problems such as difficulty seeing, speaking or moving following the seizure C.  The patient was injured during the seizure D.  The patient has a temperature over 102 F (39C) E.  The patient vomited during the seizure and now is having trouble breathing    During the Seizure   - First, ensure adequate ventilation and place patients on the floor on their left side  Loosen clothing around the neck and ensure the airway is patent. If the patient is clenching the teeth, do not force the mouth open with any object as this can cause severe damage - Remove all items from  the surrounding that can be hazardous. The patient may be oblivious to what's happening and may not even know what he or she is doing. If the patient is confused and wandering, either gently guide him/her away and block access to outside areas - Reassure the individual and be comforting - Call 911. In most cases, the seizure ends before EMS arrives. However, there are cases when seizures may last over 3 to 5 minutes. Or the individual may have developed breathing difficulties or severe injuries. If a pregnant patient or a person with diabetes develops a seizure, it is prudent to call an ambulance. - Finally, if the patient does not regain full consciousness, then call EMS. Most patients will remain confused for about 45 to 90 minutes after a seizure, so you must use judgment in calling for help.    After the Seizure (Postictal Stage)   After a seizure, most patients experience confusion, fatigue, muscle pain and/or a headache. Thus, one should permit the individual to sleep. For the next few days, reassurance is essential. Being calm and helping reorient the person is also of importance.   Most seizures are painless and end spontaneously. Seizures are not harmful to others but can lead to complications such as stress on the lungs, brain and the heart. Individuals with prior lung problems may develop labored breathing and respiratory distress.     I have spent a total of   35 minutes with the patient reviewing hospital notes,  test results, labs and examining the patient as well as establishing an assessment and plan.  > 50% of time was spent in direct patient care.    Lindie Spruce Epilepsy [facility] For questions after 5pm please refer to AMION to reach the Neurologist on call

## 2022-12-23 DIAGNOSIS — R569 Unspecified convulsions: Secondary | ICD-10-CM | POA: Diagnosis not present

## 2022-12-23 LAB — COMPREHENSIVE METABOLIC PANEL
ALT: 18 U/L (ref 0–44)
AST: 18 U/L (ref 15–41)
Albumin: 2.8 g/dL — ABNORMAL LOW (ref 3.5–5.0)
Alkaline Phosphatase: 135 U/L — ABNORMAL HIGH (ref 38–126)
Anion gap: 5 (ref 5–15)
BUN: 11 mg/dL (ref 8–23)
CO2: 25 mmol/L (ref 22–32)
Calcium: 9 mg/dL (ref 8.9–10.3)
Chloride: 106 mmol/L (ref 98–111)
Creatinine, Ser: 0.86 mg/dL (ref 0.61–1.24)
GFR, Estimated: >60 mL/min (ref >60)
Glucose, Bld: 91 mg/dL (ref 70–99)
Potassium: 4.2 mmol/L (ref 3.5–5.1)
Sodium: 136 mmol/L (ref 135–145)
Total Bilirubin: 0.6 mg/dL (ref 0.3–1.2)
Total Protein: 5.8 g/dL — ABNORMAL LOW (ref 6.5–8.1)

## 2022-12-23 LAB — CBC
HCT: 34.7 % — ABNORMAL LOW (ref 39.0–52.0)
Hemoglobin: 10.9 g/dL — ABNORMAL LOW (ref 13.0–17.0)
MCH: 29 pg (ref 26.0–34.0)
MCHC: 31.4 g/dL (ref 30.0–36.0)
MCV: 92.3 fL (ref 80.0–100.0)
Platelets: 379 10*3/uL (ref 150–400)
RBC: 3.76 MIL/uL — ABNORMAL LOW (ref 4.22–5.81)
RDW: 15.9 % — ABNORMAL HIGH (ref 11.5–15.5)
WBC: 6.6 10*3/uL (ref 4.0–10.5)
nRBC: 0 % (ref 0.0–0.2)

## 2022-12-23 MED ORDER — ADULT MULTIVITAMIN W/MINERALS CH
1.0000 | ORAL_TABLET | Freq: Every day | ORAL | Status: DC
  Administered 2022-12-23: 1 via ORAL
  Filled 2022-12-23: qty 1

## 2022-12-23 NOTE — Progress Notes (Signed)
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 Right upper lobe nodule with 1.0 cm right parafalcine meningioma in the remote past Prior CVA PAD + DES popliteal artery 08/2020 Prior orthostatic hypotension with history of falls in the remote past AAA 4 x 4 cm in 2020  Recent hospitalization 4/3-/01/15/2023 with lethargy decreased alertness subtle RUL twitching right facial droop and dysarthria-was treated by neurology for status epilepticus and given IV Keppra IV Ativan and was discharged on Keppra twice daily, Vimpat dose was increased and phenobarbital was added in addition to clonazepam on that discharge-patient was recommended to go to skilled facility--he decided to go home  Presented back to [facility]-as a code stroke with right sided weakness-TNK not given 2/2 low suspicion CVA-he is previously stayed at [facility] for about 9 months and recently started staying at his sister's place-he had 3 standard drinks yesterday (mixed drinks) and then started feeling "weak"-he told his sister he was not feeling good and came to the emergency room Blood alcohol level on arrival was 437  CBG noted to be persistently low in the 50s and 60s serum ethanol level 134 white count 5000 phenobarbital level 18 UA negative Rest of chemistries normal-noncontrast head CT = no acute intracranial process hemorrhage, CTA head and neck no large vessel occlusion Rx in ED clonazepam Vimpat 150 Keppra 1.5 g phenobarb 64 thiamine 500 IV Neurologist Dr. Dierdre Harness consulted-recommending MRI brain, EEG   Has stabilized  Hospital-Problem based course  Acute toxic metabolic encephalopathy?  Stroke DDx seizure?   Todd's paralysis?  Intoxication--stroke ruled out on CTs on admission EEG 4/23 = left posterior quadrant epilepsy focal with additional cortical dysfunction left hemisphere-maximal left posterior quadrant secondary to structural anomaly-Per epileptologist low potential for seizure recurrence but must remain on meds Keppra level was less than 2, Vimpat level is pending, phenobarb level was 18, B1 level still pending Discharge meds as per Dr. Melynda Ripple Continue anti seizure meds as follows             1. Clonazepam 1mg  BID             2. Gabapentin 300mg  TID             3. Phenobarb 65mg  BID             4. Keppra 2000mg  BID             5. Vimpat 100mg  BID  Chronic ethanolism Patient seems to understand our discussion that he should not drink as this will likely precipitate further seizures  Profound hypoglycemia Eating and drinking full meals now was hypoglycemic on arrival 4/24 CBGs consistently 80-120  Diabetes mellitus-complication of neuropathy History of PAD status post Lisfranc amputation of the left side low blood sugar now resolved Continue gabapentin 300 3 times daily for presumed neuropathy  HFpEF last EF 70-75% 02/2021 Resume Lopressor but lower than pPTa @ 12.5 twice daily Continue ASA 81, Crestor 10  Chronic issues Needs follow-up for AAA, needs follow-up for lung nodules/meningioma brain  DVT prophylaxis: SCD Code Status: Full Family Communication: Patient will be going home tomorrow-family are unable to pick up today but can coordinate in AM I discussed this with the patient's [name] Disposition:  Status is: Observation The patient will require care spanning > 2 midnights and should be moved to inpatient because:   Likely d/c am if all looks well--can triage off for [facility] if continues to look well     Subjective:  Comfortable appearing no distress still a little bit sleepy to his own estimation no fever no chills Was able to ambulate some in the room with a  Natt after having walked some with PT and feels a little bit stronger I had a good discussion with his sister on the phone who thinks that she can manage him at home if he is able to self manage some   Objective: Vitals:   12/23/22 0500 12/23/22 0655 12/23/22 0754 12/23/22 1247  BP:  118/86 139/83 (!) 130/90  Pulse:  81 64 68  Resp:  16 16 14   Temp:  97.6 F (36.4 C) (!) 97.4 F (36.3 C) 98.2 F (36.8 C)  TempSrc:  Oral Oral Oral  SpO2:  96% 97% 99%  Weight: 78.1 kg       Intake/Output Summary (Last 24 hours) at 12/23/2022 1546 Last data filed at 12/23/2022 1508 Gross per 24 hour  Intake 886.06 ml  Output 2250 ml  Net -1363.94 ml    Filed Weights   12/21/22 0310 12/22/22 0500 12/23/22 0500  Weight: 79.6 kg 79.5 kg 78.1 kg    Examination:  Slightly sleeping appearing black male no distress no icterus no pallor Neck soft supple Chest is clear n S1-S2 no murmur Moving all 4 limbs equally without deficit  Data Reviewed: personally reviewed   CBC    Component Value Date/Time   WBC 6.6 12/23/2022 0239   RBC 3.76 (L) 12/23/2022 0239   HGB 10.9 (L) 12/23/2022 0239   HGB 12.1 (L) 05/08/2018 1317   HCT 34.7 (L) 12/23/2022 0239   HCT 28.2 (L) 02/21/2019 0554   PLT 379 12/23/2022 0239   PLT 225 05/08/2018 1317   MCV 92.3 12/23/2022 0239   MCV 100 (H) 05/08/2018 1317   MCH 29.0 12/23/2022 0239   MCHC 31.4 12/23/2022 0239   RDW 15.9 (H) 12/23/2022 0239   RDW 17.5 (H) 05/08/2018 1317   LYMPHSABS 1.9 12/20/2022 0513   LYMPHSABS 1.7 05/08/2018 1317   MONOABS 0.8 12/20/2022 0513   EOSABS 0.5 12/20/2022 0513   EOSABS 0.2 05/08/2018 1317   BASOSABS 0.1 12/20/2022 0513   BASOSABS 0.0 05/08/2018 1317      Latest Ref Rng & Units 12/23/2022    2:39 AM 12/22/2022    8:27 AM 12/21/2022    4:22 AM  CMP  Glucose 70 - 99 mg/dL 91  73  96   BUN 8 - 23 mg/dL 11  13  12    Creatinine 0.61 - 1.24 mg/dL 7.82  9.56  2.13   Sodium 135 - 145 mmol/L 136  136  138   Potassium 3.5 -  5.1 mmol/L 4.2  4.5  4.0   Chloride 98 - 111 mmol/L 106  107  107   CO2 22 - 32 mmol/L 25  16  24    Calcium 8.9 - 10.3 mg/dL 9.0  9.1  8.7   Total Protein 6.5 - 8.1 g/dL 5.8  6.7  6.0   Total Bilirubin 0.3 - 1.2 mg/dL 0.6  0.5  0.4   Alkaline Phos 38 - 126 U/L 135  158  140   AST 15 - 41 U/L 18  23  18    ALT 0 - 44 U/L 18  18  14      Radiology Studies: No results found.   Scheduled Meds:  aspirin EC  81 mg Oral Daily   clonazePAM  1 mg Oral BID   folic acid  1 mg Oral Daily   gabapentin  300 mg Oral TID   lacosamide  100 mg Oral BID   levETIRAcetam  2,000 mg Oral BID   metoprolol tartrate  12.5 mg Oral BID   mometasone-formoterol  2 puff Inhalation BID   multivitamin  1 tablet Oral Daily   pantoprazole  40 mg Oral Daily   PHENobarbital  64.8 mg Oral BID   rosuvastatin  10 mg Oral Daily   Continuous Infusions:     LOS: 3 days   Time spent: 53  Rhetta Mura, MD [facility] To contact the attending provider between 7A-7P or the covering provider during after hours 7P-7A, please log into the web site [email or web address] and access using universal [place] password for that web site. If you do not have the password, please call the hospital operator.  12/23/2022, 3:46 PM

## 2022-12-23 NOTE — Evaluation (Signed)
Physical Therapy Evaluation Patient Details Name: Seth [name] MRN: [id number] DOB: [birth year] Today's Date: 12/23/2022  History of Present Illness  67 yo male presents 4/23 with slurred speech and difficulty ambulating. +ETOH, CT negative for acute changes. Concern for recrudescence of old stroke symptoms in the setting of alcohol intoxication versus Todd's paralysis. PMH includes: left frontal stroke, epilepsy, alcohol use disorder, LLE transmetatarsal amputation.   Clinical Impression  Pt in bed upon arrival of PT, agreeable to evaluation at this time. Prior to admission the pt was ambulating in his home and in community with 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 The pt now presents with limitations in functional mobility, strength, power, stability, and endurance due to above dx, and will continue to benefit from skilled PT to address these deficits. The pt attempted sit-stand transfers with single UE support to mimic use of cane, and needed modA to maintain static standing with this support, was unable to manage stepping without BUE support. Once given RW, he was able to complete hallway ambulation with improved stability, but continues to need minA to complete transfers and balance with gait. The pt is highly motivated and will benefit from continued skilled PT, would need physical assist from family to return home.         Recommendations for follow up therapy are one component of a multi-disciplinary discharge planning process, led by the attending physician.  Recommendations may be updated based on patient status, additional functional criteria and insurance authorization.  Follow Up Recommendations Can patient physically be transported by private vehicle: Yes     Assistance Recommended at Discharge Intermittent Supervision/Assistance  Patient can return home with the following  A little help with walking and/or transfers;A little help  with bathing/dressing/bathroom;Help with stairs or ramp for entrance;Assist for transportation;Assistance with cooking/housework;Direct supervision/assist for medications management;Direct supervision/assist for financial management    Equipment Recommendations None recommended by PT  Recommendations for Other Services       Functional Status Assessment Patient has had a recent decline in their functional status and demonstrates the ability to make significant improvements in function in a reasonable and predictable amount of time.     Precautions / Restrictions Precautions Precautions: Fall;Other (comment) Precaution Comments: seizures, mild right hemiparesis, LLE transmet amputation Restrictions Weight Bearing Restrictions: No      Mobility  Bed Mobility Overal bed mobility: Needs Assistance Bed Mobility: Supine to Sit     Supine to sit: Supervision, HOB elevated          Transfers Overall transfer level: Needs assistance Equipment used: Rolling Bonczek (2 wheels) Transfers: Sit to/from Stand Sit to Stand: Min assist   Step pivot transfers: Min guard       General transfer comment: minA to rise and steady, pt with poor ecentric lower to chair. completed x6 in session with use of RW    Ambulation/Gait Ambulation/Gait assistance: Min assist Gait Distance (Feet): 75 Feet (+ 150 ft) Assistive device: Rolling Dorgan (2 wheels) Gait Pattern/deviations: Step-through pattern, Trunk flexed, Decreased step length - right, Decreased dorsiflexion - right Gait velocity: decr Gait velocity interpretation: <1.31 ft/sec, indicative of household ambulator   General Gait Details: assist to steady, cues for increasing R foot clearance    Balance Overall balance assessment: Needs assistance Sitting-balance support: Feet supported, No upper extremity supported Sitting balance-[name] Scale: Good Sitting balance - Comments: Able to sit EOB and manage socks and shoes.   Standing  balance support: During  functional activity, Bilateral upper extremity supported Standing balance-[name] Scale: Poor Standing balance comment: modA to stand with single UE support, pt falling backwards. minA with BUE support                             Pertinent Vitals/Pain Pain Assessment Pain Assessment: No/denies pain    Home Living Family/patient expects to be discharged to:: Private residence Living Arrangements: Parent;Other relatives (mother and sister) Available Help at Discharge: Family Type of Home: House Home Access: Stairs to enter Entrance Stairs-Rails: Left Entrance Stairs-Number of Steps: " a few" Alternate Level Stairs-Number of Steps: flight down to the basement, pt has a suite in basement. Home Layout: Multi-level Home Equipment: Cane - single Librarian, academic (2 wheels);Shower seat;Grab bars - toilet;Grab bars - tub/shower;BSC/3in1 Additional Comments: grab bars on left hand side of toilet    Prior Function Prior Level of Function : Needs assist             Mobility Comments: pt reports tripping dure to R toe drag multiple times/week but no falls. uses cane, can ambulate in community and does so ~every other day ADLs Comments: pt reports independent with self care, family assists with medications and financial management     Hand Dominance   Dominant Hand: Left    Extremity/Trunk Assessment   Upper Extremity Assessment Upper Extremity Assessment: Defer to OT evaluation    Lower Extremity Assessment Lower Extremity Assessment: RLE deficits/detail;LLE deficits/detail RLE Deficits / Details: hx of CVA with R-sided weakness, limited DF with gait but no buckling and grossly 4/5 to MMT LLE Deficits / Details: left TM amputation    Cervical / Trunk Assessment Cervical / Trunk Assessment: Kyphotic  Communication   Communication: No difficulties  Cognition Arousal/Alertness: Awake/alert Behavior During Therapy: WFL for tasks  assessed/performed Overall Cognitive Status: No family/caregiver present to determine baseline cognitive functioning                                 General Comments: pt with poor orientation to time, following instructions well        General Comments General comments (skin integrity, edema, etc.): VSS on RA    Exercises Other Exercises Other Exercises: repeated sit-stand from recliner. x5   Assessment/Plan    PT Assessment Patient needs continued PT services  PT Problem List Decreased strength;Decreased mobility;Decreased balance;Decreased safety awareness;Cardiopulmonary status limiting activity;Decreased activity tolerance       PT Treatment Interventions Therapeutic activities;Gait training;Therapeutic exercise;Patient/family education;Balance training;Stair training;Functional mobility training;Neuromuscular re-education;DME instruction    PT Goals (Current goals can be found in the Care Plan section)  Acute Rehab PT Goals Patient Stated Goal: to go home PT Goal Formulation: With patient Time For Goal Achievement: 01/06/23 Potential to Achieve Goals: Good    Frequency Min 3X/week        AM-PAC PT "6 Clicks" Mobility  Outcome Measure Help needed turning from your back to your side while in a flat bed without using bedrails?: A Little Help needed moving from lying on your back to sitting on the side of a flat bed without using bedrails?: A Little Help needed moving to and from a bed to a chair (including a wheelchair)?: A Little Help needed standing up from a chair using your arms (e.g., wheelchair or bedside chair)?: A Little Help needed to walk in hospital room?: A Little Help needed climbing 3-5  steps with a railing? : A Lot 6 Click Score: 17    End of Session Equipment Utilized During Treatment: Gait belt Activity Tolerance: Patient tolerated treatment well Patient left: in chair;with call bell/phone within reach;with chair alarm set Nurse  Communication: Mobility status PT Visit Diagnosis: Difficulty in walking, not elsewhere classified (R26.2);Unsteadiness on feet (R26.81)    Time: 1610-9604 PT Time Calculation (min) (ACUTE ONLY): 27 min   Charges:   PT Evaluation $PT Eval Low Complexity: 1 Low PT Treatments $Gait Training: 8-22 mins        Vickki Muff, PT, DPT   Acute Rehabilitation Department Office [phone] Secure Chat Communication Preferred  Ronnie Derby 12/23/2022, 10:39 AM

## 2022-12-24 DIAGNOSIS — R569 Unspecified convulsions: Secondary | ICD-10-CM | POA: Diagnosis not present

## 2022-12-24 MED ORDER — PHENOBARBITAL 64.8 MG PO TABS: 64.8000 mg | ORAL_TABLET | Freq: Two times a day (BID) | ORAL | 1 refills | Status: DC

## 2022-12-24 MED ORDER — GABAPENTIN 300 MG PO CAPS: 300.0000 mg | ORAL_CAPSULE | Freq: Three times a day (TID) | ORAL | 1 refills | Status: DC

## 2022-12-24 MED ORDER — CLONAZEPAM 1 MG PO TABS: 1.0000 mg | ORAL_TABLET | Freq: Two times a day (BID) | ORAL | 1 refills | Status: DC

## 2022-12-24 MED ORDER — METOPROLOL TARTRATE 25 MG PO TABS: 12.5000 mg | ORAL_TABLET | Freq: Two times a day (BID) | ORAL | 0 refills | Status: DC

## 2022-12-24 MED ORDER — LACOSAMIDE 100 MG PO TABS: 100.0000 mg | ORAL_TABLET | Freq: Two times a day (BID) | ORAL | 2 refills | Status: DC

## 2022-12-24 MED ORDER — LEVETIRACETAM 1000 MG PO TABS: 2000.0000 mg | ORAL_TABLET | Freq: Two times a day (BID) | ORAL | 2 refills | Status: DC

## 2022-12-24 NOTE — Discharge Summary (Signed)
Physician Discharge Summary  Seth [name] ZOX:[id number] DOB: [birth year] DOA: 12/19/2022  PCP: Sharmon Revere, MD  Admit date: 12/19/2022 Discharge date: 12/24/2022  Time spent: 37 minutes  Recommendations for Outpatient Follow-up:  Recommend Chem-7 magnesium CBC 1 week PCP office CC Dr. Windell [place] neurology-needs 4-6-week follow-up Discuss outpatient anticoagulation strategy-continues on aspirin monotherapy previously was on Plavix-May need to discuss this in the outpatient setting with vascular given he has PAD  Patient declined referral to Alcoholics Anonymous-please follow-up with him in the outpatient setting Please follow him up for AAA, lung nodules and brain meningioma and refer as needed  Discharge Diagnoses:  MAIN problem for hospitalization   Seizure versus Todd's paralysis in the setting of acute EtOH intoxication Underlying EtOH use disorder Profound hypoglycemia on arrival 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 HFpEF AAA Lung nodules Brain meningioma  Please see below for itemized issues addressed in HOpsital- refer to other progress notes for clarity if needed  Discharge Condition: Fair  Diet recommendation: Regular  Filed Weights   12/22/22 0500 12/23/22 0500 12/24/22 0500  Weight: 79.5 kg 78.1 kg 78.3 kg    History of present illness:  47 black male chronic EtOH +/- EtOH seizure disorder (supposed to be on Vimpat Keppra phenobarbital) Subdural hemorrhage 01/16/2021 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 Right upper lobe nodule with 1.0 cm right parafalcine meningioma in the remote past Prior CVA PAD + DES popliteal artery 08/2020 Prior orthostatic hypotension with history of falls in the remote past AAA 4 x 4 cm in 2020   Recent hospitalization 4/3-/01/15/2023 with lethargy decreased alertness subtle RUL twitching right facial droop and dysarthria-was treated by neurology for status  epilepticus and given IV Keppra IV Ativan and was discharged on Keppra twice daily, Vimpat dose was increased and phenobarbital was added in addition to clonazepam on that discharge-patient was recommended to go to skilled facility--he decided to go home   Presented back to [facility]-as a code stroke with right sided weakness-TNK not given 2/2 low suspicion CVA-he is previously stayed at [facility] for about 9 months and recently started staying at his sister's place-he had 3 standard drinks yesterday (mixed drinks) and then started feeling "weak"-he told his sister he was not feeling good and came to the emergency room Blood alcohol level on arrival was 437   CBG noted to be persistently low in the 50s and 60s serum ethanol level 134 white count 5000 phenobarbital level 18 UA negative Rest of chemistries normal-noncontrast head CT = no acute intracranial process hemorrhage, CTA head and neck no large vessel occlusion Rx in ED clonazepam Vimpat 150 Keppra 1.5 g phenobarb 64 thiamine 500 IV Neurologist Dr. Dierdre Harness consulted-recommending MRI brain, EEG awaiting serum Keppra serum Vimpat INR B1 B12 levels    Hospital Course:  Acute toxic metabolic encephalopathy?  Stroke DDx seizure?  Todd's paralysis?  Intoxication--stroke ruled out on CTs on admission EEG 4/23 = left posterior quadrant epilepsy focal with additional cortical dysfunction left hemisphere-maximal left posterior quadrant secondary to structural anomaly-Per epileptologist low potential for seizure recurrence but must remain on meds Discharge meds as per Dr. Melynda Ripple Continue anti seizure meds as follows             1. Clonazepam 1mg  BID             2. Gabapentin 300mg  TID             3. Phenobarb 65mg  BID  4. Keppra 2000mg  BID             5. Vimpat 100mg  BID All of patient's prescription has been called into his pharmacy [facility] extensive discussion with his Mariel Aloe on 4/27 she understands  that he should absolutely stop drinking [address] statutes dictate that the patient should not drive for 6 months and should ideally follow-up with Dr. Teresa Coombs of neurology in about 4 to 6 weeks to ensure that he can have his meds adjusted as he was slightly somnolent during hospital stay with the 5 different meds that he is on for seizure suppression He understands that he should absolutely NOT drink as this was probably precipitant for recurrent seizure   Chronic ethanolism Patient if precontemplative will be counseled-he stated that he does not wish any interventions Follow-up with Alcoholics Anonymous on his own if he needs to   Profound hypoglycemia Eating and drinking full meals now was hypoglycemic on arrival 4/24 CBGs consistently 80-120   Diabetes mellitus-complication of neuropathy History of PAD status post Lisfranc amputation of the left side low blood sugar resolved blood sugars in the 90s to 100s at time of discharge Continue gabapentin 300 3 times daily for presumed neuropathy   HFpEF last EF 70-75% 02/2021 Resume Lopressor but lower than pPTa @ 12.5 twice daily Continue ASA 81, Crestor 10   Chronic issues Needs follow-up for AAA, needs follow-up for lung nodules/meningioma brain   Discharge Exam: Vitals:   12/23/22 2317 12/24/22 0317  BP: 102/67 123/73  Pulse: 71 66  Resp: 18 18  Temp: 97.8 F (36.6 C) 98.2 F (36.8 C)  SpO2: 97% 99%    Subj on day of d/c   Aroused from sleep but no distress seems comfortable  General Exam on discharge  EOMI NCAT slightly slow speech but otherwise coherent cannot tell me time place person Chest clear no added sound S1-S2 no murmur no rub no gallop Abdomen soft Patient has a Lisfranc amputation on the left side Did not appreciate any other focal findings  Discharge Instructions   Discharge Instructions     Diet - low sodium heart healthy   Complete by: As directed    Discharge instructions   Complete by: As  directed    Please make sure that you take your medications as indicated and DO NOT MISS Please ensure that you STOP drinking  Present yourself to your regular MD in 1 week and ensure that you get routine labs done and see the regular doc for a routine office visit  Follow with your Neurologist also to get clearance to drive--  [place] DMV statutes that you CANNOT DRIVE FOR 6 MONTHS after a [place]   Increase activity slowly   Complete by: As directed    Increase activity slowly   Complete by: As directed       Allergies as of 12/24/2022   No Known Allergies      Medication List     STOP taking these medications    clopidogrel 75 MG tablet Commonly known as: PLAVIX   cyanocobalamin 1000 MCG tablet Commonly known as: VITAMIN B12   feeding supplement Liqd       TAKE these medications    acetaminophen 325 MG tablet Commonly known as: TYLENOL Take 2 tablets (650 mg total) by mouth every 6 (six) hours as needed for headache or mild pain.   clonazePAM 1 MG tablet Commonly known as: KLONOPIN Take 1 tablet (1 mg total) by mouth  2 (two) times daily.   Daily Vitamin Formula+Minerals Tabs Take 1 tablet by mouth daily.   EQ Aspirin Adult Low Dose 81 MG tablet Generic drug: aspirin EC Take 81 mg by mouth daily.   FeroSul 325 (65 FE) MG tablet Generic drug: ferrous sulfate Take 325 mg by mouth daily.   fluticasone-salmeterol 500-50 MCG/ACT Aepb Commonly known as: ADVAIR Inhale 1 puff into the lungs 2 (two) times daily.   folic acid 1 MG tablet Commonly known as: FOLVITE Take 1 mg by mouth daily.   gabapentin 300 MG capsule Commonly known as: NEURONTIN Take 1 capsule (300 mg total) by mouth 3 (three) times daily.   Incruse Ellipta 62.5 MCG/ACT Aepb Generic drug: umeclidinium bromide INHALE 1 PUFF INTO THE LUNGS DAILY   Lacosamide 100 MG Tabs Take 1 tablet (100 mg total) by mouth 2 (two) times daily. What changed:  medication strength how much to take    levETIRAcetam 1000 MG tablet Commonly known as: KEPPRA Take 2 tablets (2,000 mg total) by mouth 2 (two) times daily. What changed:  medication strength how much to take   melatonin 3 MG Tabs tablet Take 1 tablet (3 mg total) by mouth at bedtime as needed (insomnia).   metoprolol tartrate 25 MG tablet Commonly known as: LOPRESSOR Take 0.5 tablets (12.5 mg total) by mouth 2 (two) times daily. What changed:  medication strength how much to take   nicotine 14 mg/24hr patch Commonly known as: NICODERM CQ - dosed in mg/24 hours Place 1 patch (14 mg total) onto the skin daily as needed (smoking cessation).   omeprazole 40 MG capsule Commonly known as: PRILOSEC Take 40 mg by mouth daily.   PHENobarbital 64.8 MG tablet Commonly known as: LUMINAL Take 1 tablet (64.8 mg total) by mouth 2 (two) times daily.   rosuvastatin 10 MG tablet Commonly known as: CRESTOR Take 1 tablet (10 mg total) by mouth daily.   Zinc Sulfate 220 (50 Zn) MG Tabs Take 1 tablet by mouth daily.       No Known Allergies    The results of significant diagnostics from this hospitalization (including imaging, microbiology, ancillary and laboratory) are listed below for reference.    Significant Diagnostic Studies: MR BRAIN WO CONTRAST  Result Date: 12/20/2022 CLINICAL DATA:  Right-sided weakness. EXAM: MRI HEAD WITHOUT CONTRAST TECHNIQUE: Multiplanar, multiecho pulse sequences of the brain and surrounding structures were obtained without intravenous contrast. COMPARISON:  Noncontrast CT head 1 day prior, same-day CT/CTA head and neck, brain MRI 11/29/2022. FINDINGS: Brain: There is no acute intracranial hemorrhage, extra-axial fluid collection, or acute infarct. Parenchymal volume loss with prominence of the ventricular system and extra-axial CSF spaces is unchanged, accelerated for age. There is unchanged disproportionate and asymmetric atrophy of the left hippocampus. The ventricles are stable in size.  FLAIR signal abnormality in the periventricular white matter is unchanged, likely reflecting underlying chronic small-vessel ischemic change. Multiple small remote infarcts in the bilateral cerebellar hemispheres are unchanged. SWI signal dropout overlying the left cerebral hemisphere cortex is unchanged likely reflecting sequela of prior hemorrhage. 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 There is no other mass lesion. There is no mass effect or midline shift. The pituitary and suprasellar region are normal. Vascular: The intracranial flow voids are normal. FLAIR signal abnormality is again seen in the high cervical left internal carotid artery consistent with known pseudoaneurysm seen on prior CTA. Skull and upper cervical spine: Normal marrow signal. Sinuses/Orbits: There is mild mucosal thickening in  the paranasal sinuses. There is layering fluid in the left maxillary sinus. The globes and orbits are unremarkable. Other: None. IMPRESSION: Stable noncontrast brain MRI with no acute intracranial pathology. Electronically Signed   By: Lesia Hausen M.D.   On: 12/20/2022 09:35   CT ANGIO HEAD NECK W WO CM (CODE STROKE)  Result Date: 12/20/2022 CLINICAL DATA:  Initial evaluation for neuro deficit, stroke. No other relevant history provided. EXAM: CT ANGIOGRAPHY HEAD AND NECK WITH AND WITHOUT CONTRAST TECHNIQUE: Multidetector CT imaging of the head and neck was performed using the standard protocol during bolus administration of intravenous contrast. Multiplanar CT image reconstructions and MIPs were obtained to evaluate the vascular anatomy. Carotid stenosis measurements (when applicable) are obtained utilizing NASCET criteria, using the distal internal carotid diameter as the denominator. RADIATION DOSE REDUCTION: This exam was performed according to the departmental dose-optimization program which includes automated exposure control, adjustment of the mA and/or kV according to patient size  and/or use of iterative reconstruction technique. CONTRAST:  75mL OMNIPAQUE IOHEXOL 350 MG/ML SOLN COMPARISON:  CT from 12/19/2022. FINDINGS: CTA NECK FINDINGS Aortic arch: Aortic arch within normal limits for caliber with standard branch pattern. Atheromatous change about the arch itself. No stenosis about the origin the great vessels. Right carotid system: Right common and internal carotid arteries are patent without acute dissection. Bulky calcified plaque about the right carotid bulb/proximal right ICA, without hemodynamically significant greater than 50% stenosis, stable. 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 Left carotid system: Left common and internal carotid arteries are patent without evidence for acute dissection. Bulky calcified plaque about the left carotid bulb/proximal left ICA, but without hemodynamically significant greater than 50% stenosis. Chronic pseudoaneurysm involving the cervical left ICA, not significantly changed in appearance mild narrowing of the true lumen at this level, also unchanged. Vertebral arteries: Both vertebral arteries arise from the subclavian arteries. No proximal subclavian artery stenosis. Atheromatous change at the origin of the right vertebral artery with estimated 30% stenosis, stable. Vertebral arteries patent distally without stenosis or dissection. Skeleton: No discrete or worrisome osseous lesions. Prior ACDF at C3-4. Mild chronic compression deformity of T5 noted. Patient is edentulous. Other neck: No other acute abnormality within the neck. Upper chest: Emphysema.  No acute finding. Review of the MIP images confirms the above findings CTA HEAD FINDINGS Anterior circulation: Atheromatous change about the carotid siphons with associated mild to moderate stenoses, slightly worse on the right, relatively stable from prior. A1 segments patent bilaterally. Normal anterior communicating complex.  Both ACAs are patent without stenosis. No M1 stenosis or occlusion. No proximal MCA branch occlusion or high-grade stenosis. Distal MCA branches perfused and symmetric. Diffuse small vessel atheromatous irregularity noted. Posterior circulation: Both V4 segments are patent without significant stenosis. Both PICA patent at their origins. Basilar diminutive but patent to its distal aspect without stenosis. Superior cerebellar arteries patent bilaterally. Fetal type origin of the PCAs bilaterally. Atheromatous irregularity about the PCAs bilaterally without hemodynamically significant stenosis. Venous sinuses: Grossly patent allowing for timing the contrast bolus. 1.2 cm right parafalcine meningioma without mass effect. Anatomic variants: Fetal type origin of the PCAs with overall diminutive vertebrobasilar system. Review of the MIP images confirms the above findings IMPRESSION: 1. Negative CTA for large vessel occlusion or other emergent finding. 2. Chronic bilateral cervical ICA pseudoaneurysms, stable. 3. Atheromatous change about the carotid bifurcations and carotid siphons without hemodynamically significant greater than 50% stenosis. 4. 30% stenosis at the origin of the right  vertebral artery, stable. 5. Fetal type origin of the PCAs with overall diminutive vertebrobasilar system. Aortic Atherosclerosis (ICD10-I70.0) and Emphysema (ICD10-J43.9). Electronically Signed   By: Rise Mu M.D.   On: 12/20/2022 00:57   EEG adult  Result Date: 12/19/2022 Charlsie Quest, MD     12/19/2022 10:16 PM Patient Name: TAKUMI DIN MRN: [id number] Epilepsy Attending: Charlsie Quest Referring Physician/Provider: Gordy Councilman, MD Date: 12/19/2022 Duration: 22.59 mins Patient history: 67yo M with epilepsy getting eeg to evaluate for seizure Level of alertness: Awake AEDs during EEG study: LEV, LCM, Phenobarb Technical aspects: This EEG study was done with scalp electrodes positioned according to the 10-20  International system of electrode placement. Electrical activity was reviewed with band pass filter of 1-70Hz , sensitivity of 7 uV/mm, display speed of 69mm/sec with a 60Hz  notched filter applied as appropriate. EEG data were recorded continuously and digitally stored.  Video monitoring was available and reviewed as appropriate. Description: The posterior dominant rhythm consists of 8-9 Hz activity of moderate voltage (25-35 uV) seen predominantly in posterior head regions, asymmetric ( left<right) and reactive to eye opening and eye closing. EEG showed continuous rhythmic 2-3hz  delta slowing in left hemisphere, maximal left posterior quadrant. Sharp waves were noted in left posterior quadrant.  Photic driving was not seen during photic stimulation. Hyperventilation was not performed.   ABNORMALITY -Sharp wave, left posterior quadrant - Lateralized rhythmic delta activity, left hemisphere, maximal left posterior quadrant. 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 Additionally there is cortical dysfunction arising from left hemisphere, maximal left posterior quadrant likely secondary to underlying structural abnormality. This eeg pattern is on the ictal-interictal continuum with low potential for seizure recurrence. No seizures were seen throughout the recording. If suspicion for ictal activity remains a concern, a prolonged study can be considered. Dr. Iver Nestle was notified. Charlsie Quest   CT HEAD CODE STROKE WO CONTRAST  Result Date: 12/19/2022 CLINICAL DATA:  Code stroke. Initial evaluation for neuro deficit, stroke. EXAM: CT HEAD WITHOUT CONTRAST TECHNIQUE: Contiguous axial images were obtained from the base of the skull through the vertex without intravenous contrast. RADIATION DOSE REDUCTION: This exam was performed according to the departmental dose-optimization program which includes automated exposure control, adjustment of the  mA and/or kV according to patient size and/or use of iterative reconstruction technique. COMPARISON:  Prior study from 11/29/2022. FINDINGS: Brain: Generalized age-related cerebral volume loss with chronic small vessel ischemic disease. Few scattered remote cerebellar infarcts noted. No acute intracranial hemorrhage. No acute large vessel territory infarct. No mass lesion or midline shift. Mild ventricular prominence related to global parenchymal volume loss without hydrocephalus. No extra-axial fluid collection. Vascular: No abnormal hyperdense vessel. Calcified atherosclerosis present at the skull base. Skull: Scalp soft tissues demonstrate no acute finding. Calvarium intact. Sinuses/Orbits: Globes and orbital soft tissues within normal limits. Scattered mucosal thickening noted within the ethmoidal air cells with small volume layering secretions within the left maxillary sinus. No mastoid effusion. Other: None. ASPECTS [facility] Stroke Program Early CT Score) - Ganglionic level infarction (caudate, lentiform nuclei, internal capsule, insula, M1-M3 cortex): 7 - Supraganglionic infarction (M4-M6 cortex): 3 Total score (0-10 with 10 being normal): 10 IMPRESSION: 1. No acute intracranial abnormality. 2. ASPECTS is 10. 3. Age-related cerebral atrophy with chronic small vessel ischemic disease, with a few scattered chronic cerebellar infarcts. These results were communicated to Dr. Iver Nestle at 8:53 pm on 12/19/2022 by text page via the [facility] messaging system.  Electronically Signed   By: Rise Mu M.D.   On: 12/19/2022 20:55   MR BRAIN WO CONTRAST  Result Date: 11/29/2022 CLINICAL DATA:  Right-sided weakness worse than baseline. EXAM: MRI HEAD WITHOUT CONTRAST TECHNIQUE: Multiplanar, multiecho pulse sequences of the brain and surrounding structures were obtained without intravenous contrast. COMPARISON:  Same-day noncontrast CT head FINDINGS: Brain: There is no acute intracranial hemorrhage, extra-axial fluid  collection, or acute infarct There is parenchymal volume loss with enlargement of the ventricular system and extra-axial CSF spaces, accelerated for age. There is superimposed disproportionate atrophy of the left hippocampus/mesial temporal lobe. The ventricles are stable in size, without evidence of transependymal flow of CSF. Small remote infarcts are seen in the left parietal white matter and bilateral cerebellar hemispheres. Additional FLAIR signal abnormality in the supratentorial white matter likely reflects underlying chronic small-vessel ischemic change. Hemosiderin staining is again noted over the left cerebral convexity suggesting prior hemorrhage. The pituitary and suprasellar region are normal. 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 There is no other mass lesion. There is no mass effect or midline shift. Vascular: Cranial flow voids are normal. 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 Skull and upper cervical spine: Normal marrow signal. Sinuses/Orbits: There is layering fluid in the left maxillary sinus. The globes and orbits are unremarkable. Other: None. IMPRESSION: 1. Stable appearance of the brain with no acute intracranial pathology. 2. Unchanged age accelerated parenchymal volume loss and chronic small-vessel ischemic change. 3. Layering fluid in the left maxillary sinus which can be seen with acute sinusitis in the correct clinical setting. Electronically Signed   By: Lesia Hausen M.D.   On: 11/29/2022 12:24   EEG adult  Result Date: 11/29/2022 Charlsie Quest, MD     11/29/2022  7:51 AM Patient Name: ZEN CEDILLOS MRN: [id number] Epilepsy Attending: Charlsie Quest Referring Physician/Provider: Caryl Pina, MD Date: 11/29/2022 Duration: 22.30 mins Patient history: 67 year old male presenting with recurrent  focal status epilepticus. EEG to evaluate for seizure Level of alertness: awake AEDs during EEG study: LEV, LCM, Phenobarb Technical aspects: This EEG study was done with scalp electrodes positioned according to the 10-20 International system of electrode placement. Electrical activity was reviewed with band pass filter of 1-70Hz , sensitivity of 7 uV/mm, display speed of 29mm/sec with a 60Hz  notched filter applied as appropriate. EEG data were recorded continuously and digitally stored.  Video monitoring was available and reviewed as appropriate. Description: The posterior dominant rhythm consists of 8-9 Hz activity of moderate voltage (25-35 uV) seen predominantly in posterior head regions, symmetric and reactive to eye opening and eye closing. EEG showed continuous 3 to 6 Hz theta-delta slowing in left hemisphere. Lateralized periodic discharges were noted in left hemisphere with evolution in morphology and frequency. Clinically, patient was less responsive, occasionally starting off. Per chart review at times there is right upper extremity twitching as well. This EEG pattern is consistent with electrographic status epilepticus. Hyperventilation and photic stimulation were not performed.   ABNORMALITY - Focal status epilepticus, left hemisphere - Continuous slow, left hemisphere IMPRESSION: This study showed convulsive status epilepticus arising from left hemisphere as well as cortical dysfunction in left hemisphere likely due to seizures, underlying structural abnormality.  Priyanka [name]   CT HEAD WO CONTRAST ( )  Result Date: 11/29/2022 CLINICAL DATA:  Seizure with head trauma.  Fell at home. Right-sided weakness. EXAM: CT HEAD WITHOUT CONTRAST TECHNIQUE: Contiguous axial images were obtained from the base of the skull through the vertex without intravenous contrast. RADIATION DOSE REDUCTION: This exam was performed according to the departmental dose-optimization program which includes automated exposure  control, adjustment of the mA and/or kV according to patient size and/or use of iterative reconstruction technique. COMPARISON:  CT 10/30/2022, MRI brain 10/18/2022 FINDINGS: Brain: There is moderate age advanced atrophy, with atrophic ventriculomegaly and moderately developed small-vessel disease of the cerebral white matter. There are multiple chronic lacunar infarcts in both cerebellar hemispheres. Again noted is a 1.2 cm noncalcified right parafalcine meningioma in the superior right frontal area best seen on 3:25 and 6:31. No new asymmetry is seen concerning for an acute infarct, hemorrhage or mass. There is no midline shift. Mild asymmetric prominence of the left lateral ventricle is again shown. Vascular: The carotid siphons are heavily calcified. There is calcification and the V4 left vertebral artery. No hyperdense central vessel prove Skull: Negative for fractures or focal lesions. There is no visible scalp hematoma. Sinuses/Orbits: There is patchy opacification of the bilateral ethmoid air cells. There is mild membrane thickening in the maxillary sinuses, small retention cyst or polyp with 1 in the right maxillary sinus and another in the right sphenoid air cell. Trace fluid has developed in the left maxillary sinus. Other sinuses, bilateral mastoid air cells, and middle ears are clear. Negative orbits. There is a reverse S shaped nasal septum. Unilateral pneumatization left petrous apex. Other: None. IMPRESSION: 1. No acute intracranial CT findings or depressed skull fractures. 2. 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 3. 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 4. 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 5. Sinus disease with trace fluid in the left maxillary sinus. Electronically Signed   By: Almira Bar M.D.   On: 11/29/2022 05:22    Microbiology: No results found for this or any previous visit (from the past 240 hour(s)).   Labs: Basic Metabolic  Panel: Recent Labs  Lab 12/19/22 2150 12/19/22 2153 12/20/22 0513 12/20/22 2118 12/21/22 0422 12/22/22 0827 12/23/22 0239  NA 138 134* 136  --  138 136 136  K 4.4 4.4 4.3  --  4.0 4.5 4.2  CL 104 109 107  --  107 107 106  CO2 17*  --  18*  --  24 16* 25  GLUCOSE 70 70 60*  --  96 73 91  BUN 11 12 10   --  12 13 11   CREATININE 0.87 1.00 0.88  --  0.94 0.92 0.86  CALCIUM 9.3  --  8.6*  --  8.7* 9.1 9.0  MG  --   --  2.2 2.0  --   --   --    Liver Function Tests: Recent Labs  Lab 12/19/22 2150 12/20/22 0513 12/21/22 0422 12/22/22 0827 12/23/22 0239  AST 20 20 18 23 18   ALT 14 14 14 18 18   ALKPHOS 173* 149* 140* 158* 135*  BILITOT 0.6 0.5 0.4 0.5 0.6  PROT 7.4 6.1* 6.0* 6.7 5.8*  ALBUMIN 3.6 3.0* 2.9* 3.3* 2.8*   No results for input(s): "LIPASE", "AMYLASE" in the last 168 hours. No results for input(s): "AMMONIA" in the last 168 hours. CBC: Recent Labs  Lab 12/19/22 2150 12/19/22 2153 12/20/22 0513 12/21/22 0422 12/22/22 0827 12/23/22 0239  WBC 5.6  --  5.4 5.5 6.3 6.6  NEUTROABS 2.4  --  2.0  --   --   --  HGB 12.8* 14.3 12.4* 11.7* 13.5 10.9*  HCT 42.4 42.0 39.3 36.2* 43.1 34.7*  MCV 99.8  --  93.1 92.6 93.7 92.3  PLT 251  --  359 404* 361 379   Cardiac Enzymes: No results for input(s): "CKTOTAL", "CKMB", "CKMBINDEX", "TROPONINI" in the last 168 hours. BNP: BNP (last 3 results) No results for input(s): "BNP" in the last 8760 hours.  ProBNP (last 3 results) No results for input(s): "PROBNP" in the last 8760 hours.  CBG: Recent Labs  Lab 12/22/22 0327 12/22/22 0734 12/22/22 0815 12/22/22 1134 12/22/22 1507  GLUCAP 100* 61* 112* 86 98       Signed:  Rhetta Mura MD   [facility] 12/24/2022, 7:39 AM

## 2022-12-24 NOTE — Discharge Instructions (Signed)
Recommendations for Outpatient Follow-up:  Recommend Chem-7 magnesium CBC 1 week PCP office CC Dr. Windell [place] neurology-needs 4-6-week follow-up Discuss outpatient anticoagulation strategy-continues on aspirin monotherapy previously was on Plavix-May need to discuss this in the outpatient setting with vascular given he has PAD  Patient declined referral to Alcoholics Anonymous-please follow-up with him in the outpatient setting Please follow him up for AAA, lung nodules and brain meningioma and refer as needed

## 2022-12-24 NOTE — Progress Notes (Signed)
I called patient sister, Deanna Artis with no answer. Voicemail was left. I spoke with patients mother, Seward Grater. Maggie states she will come and get him at 12 noon.

## 2022-12-24 NOTE — Progress Notes (Signed)
RNCM spoke with Annalee Genta at [facility] to confirm start of [facility] services.  Angie spoke with patient's sister, [name], who is in agreement with [facility] services.  [facility] to provide [facility] services ordered.

## 2022-12-25 ENCOUNTER — Other Ambulatory Visit (HOSPITAL_COMMUNITY): Payer: Self-pay

## 2022-12-25 LAB — LACOSAMIDE: Lacosamide: 4.2 ug/mL — ABNORMAL LOW (ref 5.0–10.0)

## 2022-12-27 ENCOUNTER — Other Ambulatory Visit: Payer: Medicare Other

## 2022-12-27 DIAGNOSIS — Z515 Encounter for palliative care: Secondary | ICD-10-CM

## 2022-12-27 LAB — VITAMIN B1: Vitamin B1 (Thiamine): 320.6 nmol/L — ABNORMAL HIGH (ref 66.5–200.0)

## 2022-12-27 NOTE — Progress Notes (Signed)
COMMUNITY PALLIATIVE CARE SW NOTE  PATIENT NAME: Seth [name] DOB: 1955/11/11 MRN: [id number]  PRIMARY CARE PROVIDER: Sharmon Revere, MD  RESPONSIBLE PARTY:  Acct ID - Guarantor Home Phone Work Phone Relationship Acct Type  [id number] Seth [name] Other* [phone]  Self P/F     [address], [place], [place] [address]    Initial [facility]-Palliative Care Encounter/Clinical Social Work  Person (s) encountered: Patient, patient's mother, sister-Seth [name], Seth [name]  Purpose of the Visit:Initial [facility]-palliative care visit  Assessment: LCSW completed a review and assessment of patient's family/social, mental health and medical history, allergies, medications, and health (functional) status, including patient self-reporting and a review of relevant consultative reports was completed today as part of a comprehensive evaluation by [facility] Clinical Social Work services.  Summary of Encounter: Patient and family provided a status update.Patient had a stroke 2 years ago. He has other episodes of seizures that have left him debilitated after these episodes. Patient most recent hospitalization was related to weakness. Patient report that he has 2-4 mixed drinks a day. He also smoke about a half a pack of cigarettes a day. Patient eats about three good meals per day to include snacks. Patient report he drinks about 6 small mini bottles a day. Patient report no urine or bowel issues. Patient denies having any pain.  Patient what sitting in a wheelchair, but will walk with his Knechtel to the bathroom. Patient had three falls last month at home and 1 at the facility. Patient has an unsteady gait and is at risk for falls.  Patient mostly concern about his balance and falls.   Patient was born and raised in [place]. Graduated high school. 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 He is single. Patient has three children. Patient has a relationship. No  military involvement.  Social Detriments of Health Nutrition: No food insecurities noted. Patient is eating three meals per day to include snacks and is fluid intake is adequate. Transportation: Patient's sisters transport him to appointments. [facility] will begin to provide patient transportation to/from appointments. Housing: Patient currently reside in his mother's home with his sister Seth [name]) and her husband. He is currently pursuing AL placement at [facility]. 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 He also has [employer] and [employer].  Social Isolation: Patient is single. He has three children whom he reports he has a great relationship with. His mother and siblings are supportive. His sisters act as his primary caregivers.  Cultural Competence: Patient report that he is Baptist by faith, but is not affiliated with a church at this time.  Patient Goals: To move into an assisted; avoid any further falls.  Social Work Interventions Provided: LCSW provided supportive presence, assessment of patient's needs, comfort, goals and code status, supportive counseling, education regarding services and advance directives (HCPOA/POA); reinforced support and contact information; obtained verbal consent.   Collaboration/Coordination of Care:   SW will follow-up with Seth [name] admissions coordinator to facilitate any needed paperwork that SW could assist with  Plan/Follow-up: Ongoing supportive visits/encounters to provide support, education, interventions and resources that align with patient's goal of care.   Social History   Tobacco Use   Smoking status: Every Day    Packs/day: 0.50    Years: 42.00    Additional pack years: 0.00    Total pack years: 21.00    Types: Cigarettes    Start date: 63    Passive exposure: Current  Smokeless tobacco: Never   Tobacco comments:    0.5 pack per day 05/20/2022  Substance Use Topics    Alcohol use: Yes    Comment: 1/2 pint vodka/day.   Heart Disease Workbook: No Dementia Workbook: No CODE STATUS: Full Code, SW to provided education ADVANCED DIRECTIVES: None, education/forms provided MOST FORM COMPLETE: None, education/forms provided HOSPICE EDUCATION PROVIDED: No  PPS: 70% Risk Level: Medium Next Appointment: 01/01/23 @ 3:30 pm       Seth Llano, LCSW

## 2023-01-01 ENCOUNTER — Other Ambulatory Visit: Payer: Medicare Other

## 2023-01-01 DIAGNOSIS — Z515 Encounter for palliative care: Secondary | ICD-10-CM

## 2023-01-04 ENCOUNTER — Institutional Professional Consult (permissible substitution): Payer: Medicare Other | Admitting: Neurology

## 2023-01-04 ENCOUNTER — Telehealth: Payer: Self-pay | Admitting: Internal Medicine

## 2023-01-04 ENCOUNTER — Encounter: Payer: Self-pay | Admitting: Neurology

## 2023-01-04 NOTE — Telephone Encounter (Signed)
Received word from Dr Warren Danes from [facility], that patient needs a followup with Dr Celine Mans. LOV 09/28/21. LMTCB with patient. Please schedule f/u at patient's earliest convenience.

## 2023-01-05 NOTE — Progress Notes (Signed)
PATIENT NAME: Seth [name] DOB: 1956/02/25 MRN: [id number]  PRIMARY CARE PROVIDER: Sharmon Revere, MD  RESPONSIBLE PARTY:  Acct ID - Guarantor Home Phone Work Phone Relationship Acct Type  [id number] [name] Other* [phone]  Self P/F     [address], [place], [place] [address]   Palliative Care Encounter Note   I connected with sister Seth [name] for Seth [name] on 01/05/23 by telephone and verified that I am speaking with the correct person using two identifiers.       HISTORY OF PRESENT ILLNESS: COPD, HTN, GERD, hx of CVA with R sided weakness  TODAY'S VISIT:  Respiratory: has no SOB at this time; has inhalers to take as needed; has a non-productive cough  Cognitive: alert and oriented at this time; sometimes he has confusion   Appetite: 2 reduced meals; snacks; drinks approx 4 glasses of water per day  GI/GU: has a daily BM; no urinary issues   Mobility: has a cane, wheelchair and Dolata; he doesn't feel secure with the Rauh he has in the home; uses the cane and works with [employer] for PT in the home  ADLs: needs assist with bathing, grooming and dressing; unable to clean the house, do laundry, cook,   Sleeping Pattern: sleeps through the night except when he wakes up to go to the bathroom; can go back to sleep  Pain: denies pain at this time  Left toes amputated  Palliative Care/ Hospice: LPN explained role and purpose of palliative care including visit frequency. Also discussed benefits of hospice care as well as the differences between the two with patient.   Goals of Care: To stay in the home      Next Appt Scheduled For: 01/17/23 @ 12pm  Acuity Level: Medium   CODE STATUS: Full Code ADVANCED DIRECTIVES: N MOST FORM: No PPS: 50%  PHYSICAL EXAM:   VITALS:There were no vitals filed for this visit.        Seth Saiki Clementeen Graham, LPN

## 2023-01-08 NOTE — Progress Notes (Signed)
This scheduled visit was not completed as patient was in the hospital.

## 2023-01-17 ENCOUNTER — Other Ambulatory Visit: Payer: Medicare Other

## 2023-01-17 DIAGNOSIS — Z515 Encounter for palliative care: Secondary | ICD-10-CM

## 2023-01-17 NOTE — Progress Notes (Signed)
COMMUNITY PALLIATIVE CARE SW NOTE  PATIENT NAME: Seth [name] DOB: [birth year] MRN: [id number]  PRIMARY CARE PROVIDER: Sharmon Revere, MD  RESPONSIBLE PARTY:  Acct ID - Guarantor Home Phone Work Phone Relationship Acct Type  [id number] [name] Other* [phone]  Self P/F     [address], [place], [place] [address]                 [facility]-Palliative Care Encounter/Clinical Social Work  History of Present Illnesses: COPD, CVA with right-sided weakness, HTN, GERD Peripheral arterial disease  Person (s) encountered: PCG/ sister-Ingrid, LCSW-M.Panhia Karl   Purpose of the Visit:  [facility]-palliative care telephonic encounter/visit   Assessment: LCSW completed a review and assessment of patient's family/social, mental health and medical history, allergies, medications, and health (functional) status, including patient self-reporting and a review of relevant consultative reports was completed today as part of a comprehensive evaluation by [facility] Palliative Care Clinical Social Work services.   Summary of Encounter: Patient's  sister/PCG-Ingrid provided a status update. Patient continues to receive physical therapy through [facility] at home. He has started allowing his sister to manage his medications. He is now taking his medications appropriately and she feels it has made a difference. She is hoping for patient to get a pill box for patient to make things easier. She report that he has decreased his alcoholic drinks to just 1 per day. His intake has improved to where he is eating 2 meals, but he is snacking more. Patient continues to have no urine or bowel issues. He is not having any pain.  Patient continues to utilize  a wheelchair to sit and ambulate himself in during the day, but will walk with his Cassaday to the bathroom. Patient has not had any falls. However his gait remains unsteady and he is at risk for falls.   Patient and his family are still seeking placement. He  did not have the income to go the facility [facility]) they were previously looking at. SW provided education to the sister regarding Medicaid facilities in the area and residential care facilities that he may be able to afford. Patient has a follow-up appointment with [facility] tomorrow. He has also has a neurology appointment for 02/28/23.  Social Detriments of Health Nutrition: No food insecurities noted. Patient is eating two meals per day to include snacks and is fluid intake is adequate. Transportation: Patient's sisters transport him to appointments. [employer] also provide patient transportation to/from appointments. Housing: Patient currently reside in his mother's home with his [name]) and her husband. He is currently pursuing AL placement. 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 He also has [employer] and [employer].  Social Isolation: Patient is single. He has three children whom he reports he has a great relationship with, but is not involved in his care. His mother and siblings are supportive. His sisters act as his primary caregivers.  Cultural Competence: Patient report that he is Baptist by faith, but is not affiliated with a church at this time.   Patient Goals: To avoid any falls, and seek placement.   Social Work Interventions Provided: LCSW provided supportive presence, assessment of patient's needs, comfort, supportive counseling, education regarding resources for placement and medicaid,  reinforced support and contact information.   Collaboration/Coordination of Care:   SW to assist with any needed paperwork that SW could assist with for placement.   Plan/Follow-up: Ongoing supportive visits/encounters to provide support, education, interventions and resources that  align with patient's goal of care.    Social History   Tobacco Use   Smoking status: Every Day    Packs/day: 0.50    Years: 42.00     Additional pack years: 0.00    Total pack years: 21.00    Types: Cigarettes    Start date: 1980    Passive exposure: Current   Smokeless tobacco: Never   Tobacco comments:    0.5 pack per day 05/20/2022  Substance Use Topics   Alcohol use: Yes    Comment: 1/2 pint vodka/day.    CODE STATUS: Full Code ADVANCED DIRECTIVES: No MOST FORM COMPLETE: No HOSPICE EDUCATION PROVIDED: No  PPS: 50% Risk Score: Medium Next Scheduled Appointment: 01/30/23 @ 1pm  Clydia Llano, LCSW

## 2023-01-30 ENCOUNTER — Other Ambulatory Visit: Payer: Medicare Other

## 2023-01-30 DIAGNOSIS — Z515 Encounter for palliative care: Secondary | ICD-10-CM

## 2023-01-31 ENCOUNTER — Other Ambulatory Visit: Payer: Self-pay

## 2023-01-31 ENCOUNTER — Emergency Department (HOSPITAL_COMMUNITY): Payer: Medicare Other

## 2023-01-31 ENCOUNTER — Emergency Department (HOSPITAL_COMMUNITY)
Admission: EM | Admit: 2023-01-31 | Discharge: 2023-01-31 | Disposition: A | Discharge status: Home / Self Care | Payer: Medicare Other | Attending: Emergency Medicine | Admitting: Emergency Medicine

## 2023-01-31 ENCOUNTER — Encounter (HOSPITAL_COMMUNITY): Payer: Self-pay | Admitting: Emergency Medicine

## 2023-01-31 DIAGNOSIS — F17200 Nicotine dependence, unspecified, uncomplicated: Secondary | ICD-10-CM | POA: Insufficient documentation

## 2023-01-31 DIAGNOSIS — R569 Unspecified convulsions: Secondary | ICD-10-CM | POA: Insufficient documentation

## 2023-01-31 DIAGNOSIS — Z7982 Long term (current) use of aspirin: Secondary | ICD-10-CM | POA: Insufficient documentation

## 2023-01-31 DIAGNOSIS — J449 Chronic obstructive pulmonary disease, unspecified: Secondary | ICD-10-CM | POA: Insufficient documentation

## 2023-01-31 DIAGNOSIS — W19XXXA Unspecified fall, initial encounter: Secondary | ICD-10-CM

## 2023-01-31 DIAGNOSIS — W010XXA Fall on same level from slipping, tripping and stumbling without subsequent striking against object, initial encounter: Secondary | ICD-10-CM | POA: Diagnosis not present

## 2023-01-31 DIAGNOSIS — Z7951 Long term (current) use of inhaled steroids: Secondary | ICD-10-CM | POA: Diagnosis not present

## 2023-01-31 DIAGNOSIS — Y900 Blood alcohol level of less than 20 mg/100 ml: Secondary | ICD-10-CM | POA: Insufficient documentation

## 2023-01-31 LAB — COMPREHENSIVE METABOLIC PANEL
ALT: 16 U/L (ref 0–44)
AST: 21 U/L (ref 15–41)
Albumin: 3.6 g/dL (ref 3.5–5.0)
Alkaline Phosphatase: 150 U/L — ABNORMAL HIGH (ref 38–126)
Anion gap: 11 (ref 5–15)
BUN: 13 mg/dL (ref 8–23)
CO2: 21 mmol/L — ABNORMAL LOW (ref 22–32)
Calcium: 9.3 mg/dL (ref 8.9–10.3)
Chloride: 102 mmol/L (ref 98–111)
Creatinine, Ser: 0.94 mg/dL (ref 0.61–1.24)
GFR, Estimated: >60 mL/min (ref >60)
Glucose, Bld: 82 mg/dL (ref 70–99)
Potassium: 4.2 mmol/L (ref 3.5–5.1)
Sodium: 134 mmol/L — ABNORMAL LOW (ref 135–145)
Total Bilirubin: 0.7 mg/dL (ref 0.3–1.2)
Total Protein: 6.8 g/dL (ref 6.5–8.1)

## 2023-01-31 LAB — CBC WITH DIFFERENTIAL/PLATELET
Abs Immature Granulocytes: 0.01 10*3/uL (ref 0.00–0.07)
Basophils Absolute: 0 10*3/uL (ref 0.0–0.1)
Basophils Relative: 1 %
Eosinophils Absolute: 0.2 10*3/uL (ref 0.0–0.5)
Eosinophils Relative: 3 %
HCT: 41.3 % (ref 39.0–52.0)
Hemoglobin: 12.8 g/dL — ABNORMAL LOW (ref 13.0–17.0)
Immature Granulocytes: 0 %
Lymphocytes Relative: 20 %
Lymphs Abs: 1 10*3/uL (ref 0.7–4.0)
MCH: 28.6 pg (ref 26.0–34.0)
MCHC: 31 g/dL (ref 30.0–36.0)
MCV: 92.2 fL (ref 80.0–100.0)
Monocytes Absolute: 0.6 10*3/uL (ref 0.1–1.0)
Monocytes Relative: 12 %
Neutro Abs: 3.3 10*3/uL (ref 1.7–7.7)
Neutrophils Relative %: 64 %
Platelets: 253 10*3/uL (ref 150–400)
RBC: 4.48 MIL/uL (ref 4.22–5.81)
RDW: 14.6 % (ref 11.5–15.5)
WBC: 5.1 10*3/uL (ref 4.0–10.5)
nRBC: 0 % (ref 0.0–0.2)

## 2023-01-31 LAB — CBG MONITORING, ED: Glucose-Capillary: 79 mg/dL (ref 70–99)

## 2023-01-31 LAB — PHENOBARBITAL LEVEL: Phenobarbital: 23.7 ug/mL (ref 15.0–40.0)

## 2023-01-31 LAB — ETHANOL: Alcohol, Ethyl (B): <10 mg/dL (ref <10)

## 2023-01-31 MED ORDER — SODIUM CHLORIDE 0.9 % IV SOLN: 2000.0000 mg | Freq: Once | INTRAVENOUS | Status: DC

## 2023-01-31 MED ORDER — LEVETIRACETAM IN NACL 1000 MG/100ML IV SOLN
1000.0000 mg | Freq: Once | INTRAVENOUS | Status: AC
  Administered 2023-01-31: 1000 mg via INTRAVENOUS
  Filled 2023-01-31: qty 100

## 2023-01-31 NOTE — ED Notes (Signed)
Patient transported to CT 

## 2023-01-31 NOTE — ED Triage Notes (Signed)
Pt BIB [facility] from home due to seizure like activity after a fall he had at 0700 today.  Pt does have hx of stroke with right sided deficits.  20g left hand.  Pt given 2.5 versed IV en route.  VS BP 110/72, Pulse 86, Resp 18, SpO2 95%, CBG 104

## 2023-01-31 NOTE — Discharge Instructions (Signed)
As we discussed, your workup in the ER today was reassuring for acute findings.  Laboratory evaluation and CT imaging did not reveal any emergent concerns.  Given this, I recommend that you continue to be compliant with your home seizure medications and call your neurologist to schedule a close follow-up appointment at your earliest convenience.  Please call your primary care doctor to schedule an appointment as well.  Return if development of any new or worsening symptoms.

## 2023-01-31 NOTE — Progress Notes (Signed)
[facility] ED [address] [facility]) Hospital Liaison Note  Patient is currently receiving outpatient palliative care services with [employer].  Liaison will monitor patient for discharge plans while patient in hospital.  Please call for any hospice or outpatient palliative care needs.  Doreatha Martin, RN [facility] Liaison [phone]

## 2023-01-31 NOTE — Progress Notes (Signed)
COMMUNITY PALLIATIVE CARE SW NOTE  PATIENT NAME: Seth [name] DOB: Sep 26, 1955 MRN: [id number]  PRIMARY CARE PROVIDER: Sharmon Revere, MD  RESPONSIBLE PARTY:  Acct ID - Guarantor Home Phone Work Phone Relationship Acct Type  [id number] [name] Other* [phone]  Self P/F     [address], [place], [place] [address]    [facility]-Palliative Care Visit/Clinical Social Work   History of Present Illnesses: COPD, CVA with right-sided weakness, HTN, GERD Peripheral arterial disease   Person (s) encountered: PCG/ sister-Ingrid, LCSW-M.Phylis Javed   Purpose of the Visit:  [facility]-palliative care visit   Assessment: LCSW completed a review and assessment of patient's family/social, mental health and medical history, allergies, medications, and health (functional) status, including patient self-reporting and a review of relevant consultative reports was completed today as part of a comprehensive evaluation by [employer] Palliative Care Clinical Social Work services.   Summary of Encounter: SW arrived at patient's home for a scheduled visit. He was in the car as he was headed to an appointment with his sister and mother. SW inquired how was he was doing. Patient and his  sister had indicated that there had not been any changes in patient's plan of care.  His intake has improved overall, and he is snacking more. Patient continues to have no urine or bowel issues. He is not having any pain.  Patient continues to utilize  a wheelchair to sit and ambulate himself , but he is still able to walk with his Mohammad to the bathroom and out to the car. Patient remains unsteady and he is at risk for falls.    Social Detriments of Health Nutrition: No food insecurities noted. Patient is eating two meals per day to include snacks and is fluid intake is adequate. Transportation: Patient's sisters transport him to appointments. [employer] also provide patient transportation to/from  appointments. Housing: Patient currently reside in his mother's home with his [name]) and her husband. He is currently pursuing AL placement. 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 He also has [employer] and [employer].  Social Isolation: Patient is single. He has three children whom he reports he has a great relationship with, but is not involved in his care. His mother and siblings are supportive. His sisters act as his primary caregivers.  Cultural Competence: Patient report that he is Baptist by faith, but is not affiliated with a church at this time.   Patient Goals: To avoid any falls, and seek placement.   Social Work Interventions Provided: LCSW provided supportive presence, assessment of patient's needs, comfort, supportive counseling, education regarding resources for placement and medicaid,  reinforced support and contact information.   Collaboration/Coordination of Care:   SW to assist with any needed paperwork that SW could assist with for placement.   Plan/Follow-up: Ongoing supportive visits/encounters to provide support, education, interventions and resources that align with patient's goal of care.   Social History   Tobacco Use   Smoking status: Every Day    Packs/day: 0.50    Years: 42.00    Additional pack years: 0.00    Total pack years: 21.00    Types: Cigarettes    Start date: 1980    Passive exposure: Current   Smokeless tobacco: Never   Tobacco comments:    0.5 pack per day 05/20/2022  Substance Use Topics   Alcohol use: Yes    Comment: 1/2 pint vodka/day.    CODE STATUS: Full Code ADVANCED DIRECTIVES: No MOST  FORM COMPLETE: No HOSPICE EDUCATION PROVIDED: No  PPS: 50% Pain Score: 0 Risk Score: Medium Next Appointment: 02/13/23 @ 2 pm  Clydia Llano, LCSW

## 2023-01-31 NOTE — ED Provider Notes (Signed)
[place] EMERGENCY DEPARTMENT AT [facility] Provider Note   CSN: [id number] Arrival date & time: 01/31/23  1120     History  Chief Complaint  Patient presents with   Seizures   Fall    Seth [name] is a 67 y.o. male.  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 He states that earlier today he tripped over a table and fell to the ground.  He did not hit his head or lose consciousness during the fall.  States that immediately after the fall he began to note seizure-like activity. He states that his right arm was shaking which is consistent with his known seizure-like activity.  He was conscious throughout the episode which is baseline as well.  He states that this persisted for approximately 45 minutes until EMS gave him medication to stop the seizure. Patient states that he has been complaint with his seizure medications. He currently denies any symptoms.  Denies headaches, vision changes, fevers, chills, chest pain, shortness of breath, nausea, vomiting, abdominal pain.  Of note, patient does state that he drinks alcohol every day.  His last drink was yesterday. Patient states that before today his last seizure was 2 months ago when he was seen here.    The history is provided by the patient. No language interpreter was used.  Seizures Fall       Home Medications Prior to Admission medications   Medication Sig Start Date End Date Taking? Authorizing Provider  acetaminophen (TYLENOL) 325 MG tablet Take 2 tablets (650 mg total) by mouth every 6 (six) hours as needed for headache or mild pain. 10/18/21   Almon Hercules, MD  clonazePAM (KLONOPIN) 1 MG tablet Take 1 tablet (1 mg total) by mouth 2 (two) times daily. 12/24/22 02/22/23  Rhetta Mura, MD  EQ ASPIRIN ADULT LOW DOSE 81 MG tablet Take 81 mg by mouth  daily. Patient not taking: Reported on 12/20/2022 10/09/22   [provider]  FEROSUL 325 (65 Fe) MG tablet Take 325 mg by mouth daily. 10/09/22   [provider]  fluticasone-salmeterol (ADVAIR) 500-50 MCG/ACT AEPB Inhale 1 puff into the lungs 2 (two) times daily. Patient not taking: Reported on 12/20/2022 04/13/21   [provider]  folic acid (FOLVITE) 1 MG tablet Take 1 mg by mouth daily. 10/09/22   [provider]  gabapentin (NEURONTIN) 300 MG capsule Take 1 capsule (300 mg total) by mouth 3 (three) times daily. 12/24/22   Rhetta Mura, MD  INCRUSE ELLIPTA 62.5 MCG/INH AEPB INHALE 1 PUFF INTO THE LUNGS DAILY Patient not taking: Reported on 12/20/2022 04/08/19   Julieanne Manson, MD  Lacosamide 100 MG TABS Take 1 tablet (100 mg total) by mouth 2 (two) times daily. 12/24/22   Rhetta Mura, MD  levETIRAcetam (KEPPRA) 1000 MG tablet Take 2 tablets (2,000 mg total) by mouth 2 (two) times daily. 12/24/22   Rhetta Mura, MD  melatonin 3 MG TABS tablet Take 1 tablet (3 mg total) by mouth at bedtime as needed (insomnia). 12/22/22   Rhetta Mura, MD  metoprolol tartrate (LOPRESSOR) 25 MG tablet Take 0.5 tablets (12.5 mg total) by mouth 2 (two) times daily. 12/24/22   Rhetta Mura, MD  Multiple Vitamins-Minerals (DAILY VITAMIN FORMULA+MINERALS) TABS Take 1 tablet by mouth daily. 10/09/22   [provider]  nicotine (NICODERM CQ - DOSED IN MG/24 HOURS) 14 mg/24hr patch  Place 1 patch (14 mg total) onto the skin daily as needed (smoking cessation). 12/22/22   Rhetta Mura, MD  omeprazole (PRILOSEC) 40 MG capsule Take 40 mg by mouth daily. Patient not taking: Reported on 12/20/2022 10/09/22   [provider]  PHENobarbital (LUMINAL) 64.8 MG tablet Take 1 tablet (64.8 mg total) by mouth 2 (two) times daily. 12/24/22 02/22/23  Rhetta Mura, MD  rosuvastatin (CRESTOR) 10 MG tablet Take 1 tablet (10 mg total) by mouth  daily. Patient not taking: Reported on 12/20/2022 09/28/20   Marinda Elk, MD  Zinc Sulfate 220 (50 Zn) MG TABS Take 1 tablet by mouth daily. 10/09/22   [provider]      Allergies    Patient has no known allergies.    Review of Systems   Review of Systems  Neurological:  Positive for seizures.  All other systems reviewed and are negative.   Physical Exam Updated Vital Signs BP (!) 147/96   Pulse 72   Temp 98.6 F (37 C) (Oral)   Resp 17   Ht 6\' 3"  (1.905 m)   Wt 79 kg   SpO2 98%   BMI 21.77 kg/m  Physical Exam Vitals and nursing note reviewed.  Constitutional:      General: He is not in acute distress.    Appearance: Normal appearance. He is normal weight. He is not ill-appearing, toxic-appearing or diaphoretic.  HENT:     Head: Normocephalic and atraumatic.  Eyes:     Extraocular Movements: Extraocular movements intact.     Pupils: Pupils are equal, round, and reactive to light.  Cardiovascular:     Rate and Rhythm: Normal rate.  Pulmonary:     Effort: Pulmonary effort is normal. No respiratory distress.  Musculoskeletal:        General: Normal range of motion.     Cervical back: Normal range of motion.  Skin:    General: Skin is warm and dry.  Neurological:     General: No focal deficit present.     Mental Status: He is alert and oriented to person, place, and time.     Comments: Right upper extremity with 0/5 strength at the shoulder and elbow but able to grip 3/5   Left upper extremity 5/5 strength throughout   Right lower extremity able to hold off the bed   Left lower extremity 5/5 strength throughout   Smile symmetric, alert and oriented x 3  According to patient, these deficits are all unchanged from baseline  Psychiatric:        Mood and Affect: Mood normal.        Behavior: Behavior normal.     ED Results / Procedures / Treatments   Labs (all labs ordered are listed, but only abnormal results are displayed) Labs Reviewed   COMPREHENSIVE METABOLIC PANEL - Abnormal; Notable for the following components:      Result Value   Sodium 134 (*)    CO2 21 (*)    Alkaline Phosphatase 150 (*)    All other components within normal limits  CBC WITH DIFFERENTIAL/PLATELET - Abnormal; Notable for the following components:   Hemoglobin 12.8 (*)    All other components within normal limits  PHENOBARBITAL LEVEL  ETHANOL  LEVETIRACETAM LEVEL  LAMOTRIGINE LEVEL  CBG MONITORING, ED    EKG None  Radiology CT Head Wo Contrast  Result Date: 01/31/2023 CLINICAL DATA:  Head trauma, moderate-severe EXAM: CT HEAD WITHOUT CONTRAST TECHNIQUE: Contiguous axial images were obtained from  the base of the skull through the vertex without intravenous contrast. RADIATION DOSE REDUCTION: This exam was performed according to the departmental dose-optimization program which includes automated exposure control, adjustment of the mA and/or kV according to patient size and/or use of iterative reconstruction technique. COMPARISON:  CT head April 23 24. FINDINGS: Brain: No evidence of acute infarction, hemorrhage, hydrocephalus, extra-axial collection or mass lesion/mass effect. Remote cerebellar infarcts. Cerebral atrophy. Ex vacuo ventricular dilation. Vascular: No hyperdense vessel. Skull: No acute fracture. Sinuses/Orbits: Largely clear sinuses.  No acute orbital findings. IMPRESSION: No evidence of acute intracranial abnormality. Electronically Signed   By: Feliberto Harts M.D.   On: 01/31/2023 13:06    Procedures Procedures    Medications Ordered in ED Medications  levETIRAcetam (KEPPRA) IVPB 1000 mg/100 mL premix (0 mg Intravenous Stopped 01/31/23 1219)    Followed by  levETIRAcetam (KEPPRA) IVPB 1000 mg/100 mL premix (0 mg Intravenous Stopped 01/31/23 1249)    ED Course/ Medical Decision Making/ A&P                             Medical Decision Making Amount and/or Complexity of Data Reviewed Labs: ordered. Radiology:  ordered.  Risk Prescription drug management.   This patient is a 67 y.o. male who presents to the ED for concern of fall, seizure, this involves an extensive number of treatment options, and is a complaint that carries with it a high risk of complications and morbidity. The emergent differential diagnosis prior to evaluation includes, but is not limited to,  The differential diagnosis for includes but is not limited to idiopathic seizure, traumatic brain injury, intracranial hemorrhage, vascular lesion, mass or space containing lesion, degenerative neurologic disease, congenital brain abnormality, infectious etiology such as meningitis, encephalitis or abscess, metabolic disturbance including hyper or hypoglycemia, hyper or hyponatremia, hyperosmolar state, uremia, hepatic failure, hypocalcemia, hypomagnesemia.  Toxic substances such as cocaine, lidocaine, antidepressants, theophylline, alcohol withdrawal, drug withdrawal, eclampsia, hypertensive encephalopathy and anoxic brain injury.  This is not an exhaustive differential.   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 Pertinent results include: Patient on Vimpat, phenobarbital, and Keppra for his seizures.  Several recent admissions for same.  Physical Exam: Physical exam performed. The pertinent findings include: Per above, right-sided weakness that is unchanged from his baseline.  Lab Tests: I ordered, and personally interpreted labs.  The pertinent results include:  hgb 12.8 improved from previous. Na 134, bicarb 21, alk phos 150, consistent with previous.  Phenobarbital levels WNL   Imaging Studies: I ordered imaging studies including CT head. I independently visualized and interpreted imaging which showed NAD. I agree with the radiologist interpretation.   Cardiac Monitoring:   The patient was maintained on a cardiac monitor.  My attending physician Dr. Eloise Harman viewed and interpreted the cardiac monitored which showed an underlying rhythm of: no STEMI. I agree with this interpretation.   Medications: I ordered medication including keppra  for seizure. Reevaluation of the patient after these medicines showed that the patient improved. I have reviewed the patients home medicines and have made adjustments as needed.   Disposition: After consideration of the diagnostic results and the patients response to treatment, I feel that emergency department workup does not suggest an emergent condition requiring admission or immediate intervention beyond what has been performed at this time. The plan is: Discharge  with close PCP and neurology follow-up outpatient.  Given that this is his first breakthrough seizure in 2 months, no indication to change his medication today.  Will defer to neurology outpatient for same.  Emphasized importance of medication compliance and close outpatient follow-up.  Patient is afebrile, nontoxic-appearing, and in no acute distress with reassuring vital signs and is alert and oriented and neurologically intact without new focal neurologic deficits.. After monitoring for 4 hours, patient continues to be at his baseline and has not had any subsequent seizure-like episodes. Evaluation and diagnostic testing in the emergency department does not suggest an emergent condition requiring admission or immediate intervention beyond what has been performed at this time.  Plan for discharge with close PCP follow-up.  Patient is understanding and amenable with plan, educated on red flag symptoms that would prompt immediate return.  Patient discharged in stable condition.   I discussed this case with my attending physician Dr. Eloise Harman who cosigned this note including patient's presenting symptoms, physical exam, and planned diagnostics and interventions. Attending physician  stated agreement with plan or made changes to plan which were implemented.    Final Clinical Impression(s) / ED Diagnoses Final diagnoses:  Fall, initial encounter  Seizure [facility])    Rx / DC Orders ED Discharge Orders     None     An After Visit Summary was printed and given to the patient.     Vear Clock 01/31/23 1508    Rondel Baton, MD 01/31/23 [phone]

## 2023-02-01 LAB — LEVETIRACETAM LEVEL: Levetiracetam Lvl: <2 ug/mL — ABNORMAL LOW (ref 10.0–40.0)

## 2023-02-01 LAB — LAMOTRIGINE LEVEL: Lamotrigine Lvl: <1 ug/mL — ABNORMAL LOW (ref 2.0–20.0)

## 2023-02-13 ENCOUNTER — Emergency Department (HOSPITAL_COMMUNITY)
Admission: EM | Admit: 2023-02-13 | Discharge: 2023-02-13 | Disposition: A | Discharge status: Home / Self Care | Payer: Medicare Other | Attending: Emergency Medicine | Admitting: Emergency Medicine

## 2023-02-13 ENCOUNTER — Emergency Department (HOSPITAL_COMMUNITY): Payer: Medicare Other

## 2023-02-13 ENCOUNTER — Other Ambulatory Visit: Payer: Self-pay

## 2023-02-13 DIAGNOSIS — S0990XA Unspecified injury of head, initial encounter: Secondary | ICD-10-CM | POA: Diagnosis present

## 2023-02-13 DIAGNOSIS — S0081XA Abrasion of other part of head, initial encounter: Secondary | ICD-10-CM | POA: Insufficient documentation

## 2023-02-13 DIAGNOSIS — Z79899 Other long term (current) drug therapy: Secondary | ICD-10-CM | POA: Diagnosis not present

## 2023-02-13 DIAGNOSIS — F1090 Alcohol use, unspecified, uncomplicated: Secondary | ICD-10-CM | POA: Diagnosis not present

## 2023-02-13 DIAGNOSIS — J449 Chronic obstructive pulmonary disease, unspecified: Secondary | ICD-10-CM | POA: Insufficient documentation

## 2023-02-13 DIAGNOSIS — I69351 Hemiplegia and hemiparesis following cerebral infarction affecting right dominant side: Secondary | ICD-10-CM | POA: Insufficient documentation

## 2023-02-13 DIAGNOSIS — S60511A Abrasion of right hand, initial encounter: Secondary | ICD-10-CM | POA: Diagnosis not present

## 2023-02-13 DIAGNOSIS — W010XXA Fall on same level from slipping, tripping and stumbling without subsequent striking against object, initial encounter: Secondary | ICD-10-CM | POA: Insufficient documentation

## 2023-02-13 DIAGNOSIS — W19XXXA Unspecified fall, initial encounter: Secondary | ICD-10-CM

## 2023-02-13 DIAGNOSIS — T148XXA Other injury of unspecified body region, initial encounter: Secondary | ICD-10-CM

## 2023-02-13 LAB — COMPREHENSIVE METABOLIC PANEL
ALT: 21 U/L (ref 0–44)
AST: 27 U/L (ref 15–41)
Albumin: 3.5 g/dL (ref 3.5–5.0)
Alkaline Phosphatase: 144 U/L — ABNORMAL HIGH (ref 38–126)
Anion gap: 10 (ref 5–15)
BUN: 14 mg/dL (ref 8–23)
CO2: 19 mmol/L — ABNORMAL LOW (ref 22–32)
Calcium: 8.9 mg/dL (ref 8.9–10.3)
Chloride: 107 mmol/L (ref 98–111)
Creatinine, Ser: 0.92 mg/dL (ref 0.61–1.24)
GFR, Estimated: >60 mL/min (ref >60)
Glucose, Bld: 77 mg/dL (ref 70–99)
Potassium: 3.8 mmol/L (ref 3.5–5.1)
Sodium: 136 mmol/L (ref 135–145)
Total Bilirubin: 0.3 mg/dL (ref 0.3–1.2)
Total Protein: 6.8 g/dL (ref 6.5–8.1)

## 2023-02-13 LAB — CBC WITH DIFFERENTIAL/PLATELET
Abs Immature Granulocytes: 0.02 10*3/uL (ref 0.00–0.07)
Basophils Absolute: 0 10*3/uL (ref 0.0–0.1)
Basophils Relative: 1 %
Eosinophils Absolute: 0.2 10*3/uL (ref 0.0–0.5)
Eosinophils Relative: 4 %
HCT: 43.3 % (ref 39.0–52.0)
Hemoglobin: 13.4 g/dL (ref 13.0–17.0)
Immature Granulocytes: 0 %
Lymphocytes Relative: 31 %
Lymphs Abs: 1.7 10*3/uL (ref 0.7–4.0)
MCH: 28.2 pg (ref 26.0–34.0)
MCHC: 30.9 g/dL (ref 30.0–36.0)
MCV: 91.2 fL (ref 80.0–100.0)
Monocytes Absolute: 0.6 10*3/uL (ref 0.1–1.0)
Monocytes Relative: 10 %
Neutro Abs: 3 10*3/uL (ref 1.7–7.7)
Neutrophils Relative %: 54 %
Platelets: 242 10*3/uL (ref 150–400)
RBC: 4.75 MIL/uL (ref 4.22–5.81)
RDW: 14.4 % (ref 11.5–15.5)
WBC: 5.6 10*3/uL (ref 4.0–10.5)
nRBC: 0 % (ref 0.0–0.2)

## 2023-02-13 LAB — ETHANOL: Alcohol, Ethyl (B): 59 mg/dL — ABNORMAL HIGH (ref <10)
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
## 2023-02-13 NOTE — Discharge Instructions (Signed)
Patient had CT scan of the head and neck that were normal.  No injuries.  There is no fracture or broken bone in his right hand.  For abrasions wash with soap and water and use bacitracin or Neosporin.

## 2023-02-13 NOTE — ED Triage Notes (Signed)
Pt to the ed from home via ems with a CC of fall in front yard after tripping. Pt did hit head. Pt relays he takes blood thinners. Pt has hx of stroke  with right sided weakness.

## 2023-02-13 NOTE — ED Provider Notes (Signed)
[place] EMERGENCY DEPARTMENT AT [facility] Provider Note   CSN: [id number] Arrival date & time: 02/13/23  1757     History  Chief Complaint  Patient presents with   Marletta Lor    Seth [name] is a 67 y.o. male.  Patient here after mechanical fall.  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  Was walking with his cane outside when he tripped and fell.  He admits to drinking alcohol today.  Abrasion to his right side of his forehead and his right hand.  He has no headache or neck pain or right hand pain.  No other pain elsewhere.  Normal vitals with EMS.  Does not appear that he is on blood thinners.  The history is provided by the patient and the EMS personnel.       Home Medications Prior to Admission medications   Medication Sig Start Date End Date Taking? Authorizing Provider  acetaminophen (TYLENOL) 325 MG tablet Take 2 tablets (650 mg total) by mouth every 6 (six) hours as needed for headache or mild pain. 10/18/21   Almon Hercules, MD  clonazePAM (KLONOPIN) 1 MG tablet Take 1 tablet (1 mg total) by mouth 2 (two) times daily. 12/24/22 02/22/23  Rhetta Mura, MD  EQ ASPIRIN ADULT LOW DOSE 81 MG tablet Take 81 mg by mouth daily. Patient not taking: Reported on 12/20/2022 10/09/22   [provider]  FEROSUL 325 (65 Fe) MG tablet Take 325 mg by mouth daily. 10/09/22   [provider]  fluticasone-salmeterol (ADVAIR) 500-50 MCG/ACT AEPB Inhale 1 puff into the lungs 2 (two) times daily. Patient not taking: Reported on 12/20/2022 04/13/21   [provider]  folic acid (FOLVITE) 1 MG tablet Take 1 mg by mouth daily. 10/09/22   [provider]  gabapentin (NEURONTIN) 300 MG capsule Take 1 capsule (300 mg total) by mouth 3 (three) times daily. 12/24/22   Rhetta Mura, MD  INCRUSE ELLIPTA 62.5 MCG/INH AEPB INHALE 1 PUFF INTO THE LUNGS DAILY Patient not taking: Reported on 12/20/2022 04/08/19   Julieanne Manson,  MD  Lacosamide 100 MG TABS Take 1 tablet (100 mg total) by mouth 2 (two) times daily. 12/24/22   Rhetta Mura, MD  levETIRAcetam (KEPPRA) 1000 MG tablet Take 2 tablets (2,000 mg total) by mouth 2 (two) times daily. 12/24/22   Rhetta Mura, MD  melatonin 3 MG TABS tablet Take 1 tablet (3 mg total) by mouth at bedtime as needed (insomnia). 12/22/22   Rhetta Mura, MD  metoprolol tartrate (LOPRESSOR) 25 MG tablet Take 0.5 tablets (12.5 mg total) by mouth 2 (two) times daily. 12/24/22   Rhetta Mura, MD  Multiple Vitamins-Minerals (DAILY VITAMIN FORMULA+MINERALS) TABS Take 1 tablet by mouth daily. 10/09/22   [provider]  nicotine (NICODERM CQ - DOSED IN MG/24 HOURS) 14 mg/24hr patch Place 1 patch (14 mg total) onto the skin daily as needed (smoking cessation). 12/22/22   Rhetta Mura, MD  omeprazole (PRILOSEC) 40 MG capsule Take 40 mg by mouth daily. Patient not taking: Reported on 12/20/2022 10/09/22   [provider]  PHENobarbital (LUMINAL) 64.8 MG tablet Take 1 tablet (64.8 mg total) by mouth 2 (two) times daily. 12/24/22 02/22/23  Rhetta Mura, MD  rosuvastatin (CRESTOR) 10 MG tablet Take 1 tablet (10 mg total) by mouth daily. Patient not taking: Reported on 12/20/2022 09/28/20   Marinda Elk, MD  Zinc Sulfate 220 (50 Zn) MG TABS Take 1 tablet by mouth daily.  10/09/22   [provider]      Allergies    Patient has no known allergies.    Review of Systems   Review of Systems  Physical Exam Updated Vital Signs BP (!) 145/86   Pulse 76   Temp 98.5 F (36.9 C) (Oral)   Resp 17   Ht 6\' 2"  (1.88 m)   Wt 79 kg   SpO2 99%   BMI 22.36 kg/m  Physical Exam Vitals and nursing note reviewed.  Constitutional:      General: He is not in acute distress.    Appearance: He is well-developed. He is not ill-appearing.  HENT:     Head: Normocephalic and atraumatic.     Nose: Nose normal.     Mouth/Throat:     Mouth:  Mucous membranes are moist.  Eyes:     Extraocular Movements: Extraocular movements intact.     Conjunctiva/sclera: Conjunctivae normal.     Pupils: Pupils are equal, round, and reactive to light.  Cardiovascular:     Rate and Rhythm: Normal rate and regular rhythm.     Heart sounds: No murmur heard. Pulmonary:     Effort: Pulmonary effort is normal. No respiratory distress.     Breath sounds: Normal breath sounds.  Abdominal:     Palpations: Abdomen is soft.     Tenderness: There is no abdominal tenderness.  Musculoskeletal:        General: No swelling or tenderness. Normal range of motion.     Cervical back: Normal range of motion and neck supple. No tenderness.  Skin:    General: Skin is warm and dry.     Capillary Refill: Capillary refill takes less than 2 seconds.     Comments: Abrasion to the right hand, abrasion to the right side of the forehead  Neurological:     Mental Status: He is alert. Mental status is at baseline.  Psychiatric:        Mood and Affect: Mood normal.     ED Results / Procedures / Treatments   Labs (all labs ordered are listed, but only abnormal results are displayed) Labs Reviewed  COMPREHENSIVE METABOLIC PANEL - Abnormal; Notable for the following components:      Result Value   CO2 19 (*)    Alkaline Phosphatase 144 (*)    All other components within normal limits  ETHANOL - Abnormal; Notable for the following components:   Alcohol, Ethyl (B) 59 (*)    All other components within normal limits  CBC WITH DIFFERENTIAL/PLATELET    EKG None  Radiology CT HEAD WO CONTRAST ( )  Result Date: 02/13/2023 CLINICAL DATA:  Recent fall with headaches and neck pain, initial encounter EXAM: CT HEAD WITHOUT CONTRAST CT CERVICAL SPINE WITHOUT CONTRAST TECHNIQUE: Multidetector CT imaging of the head and cervical spine was performed following the standard protocol without intravenous contrast. Multiplanar CT image reconstructions of the cervical spine  were also generated. RADIATION DOSE REDUCTION: This exam was performed according to the departmental dose-optimization program which includes automated exposure control, adjustment of the mA and/or kV according to patient size and/or use of iterative reconstruction technique. COMPARISON:  01/31/2023 FINDINGS: CT HEAD FINDINGS Brain: No evidence of acute infarction, hemorrhage, hydrocephalus, extra-axial collection or mass lesion/mass effect. Chronic atrophic and ischemic changes are noted. Vascular: No hyperdense vessel or unexpected calcification. Skull: Normal. Negative for fracture or focal lesion. Sinuses/Orbits: No acute finding. Other: None. CT CERVICAL SPINE FINDINGS Alignment: Within normal limits. Skull base and  vertebrae: 7 cervical segments are well visualized. Changes of prior fusion with anterior fixation are noted at C3-4. Mild facet hypertrophic changes are noted. Mild osteophytic changes are noted as well. No acute fracture or acute facet abnormality is noted. The odontoid is within normal limits. 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 Soft tissues and spinal canal: Surrounding soft tissue structures are within normal limits. Upper chest: Lung apices show emphysematous change. Other: None IMPRESSION: CT of the head: Chronic atrophic and ischemic changes without acute abnormality. CT of the cervical spine: Degenerative and postoperative changes without acute abnormality. Electronically Signed   By: Alcide Clever M.D.   On: 02/13/2023 19:43   CT Cervical Spine Wo Contrast  Result Date: 02/13/2023 CLINICAL DATA:  Recent fall with headaches and neck pain, initial encounter EXAM: CT HEAD WITHOUT CONTRAST CT CERVICAL SPINE WITHOUT CONTRAST TECHNIQUE: Multidetector CT imaging of the head and cervical spine was performed following the standard protocol without intravenous contrast. Multiplanar CT image reconstructions of the cervical spine were also generated. RADIATION DOSE REDUCTION: This  exam was performed according to the departmental dose-optimization program which includes automated exposure control, adjustment of the mA and/or kV according to patient size and/or use of iterative reconstruction technique. COMPARISON:  01/31/2023 FINDINGS: CT HEAD FINDINGS Brain: No evidence of acute infarction, hemorrhage, hydrocephalus, extra-axial collection or mass lesion/mass effect. Chronic atrophic and ischemic changes are noted. Vascular: No hyperdense vessel or unexpected calcification. Skull: Normal. Negative for fracture or focal lesion. Sinuses/Orbits: No acute finding. Other: None. CT CERVICAL SPINE FINDINGS Alignment: Within normal limits. Skull base and vertebrae: 7 cervical segments are well visualized. Changes of prior fusion with anterior fixation are noted at C3-4. Mild facet hypertrophic changes are noted. Mild osteophytic changes are noted as well. No acute fracture or acute facet abnormality is noted. The odontoid is within normal limits. 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 Soft tissues and spinal canal: Surrounding soft tissue structures are within normal limits. Upper chest: Lung apices show emphysematous change. Other: None IMPRESSION: CT of the head: Chronic atrophic and ischemic changes without acute abnormality. CT of the cervical spine: Degenerative and postoperative changes without acute abnormality. Electronically Signed   By: Alcide Clever M.D.   On: 02/13/2023 19:43   DG Hand Complete Right  Result Date: 02/13/2023 CLINICAL DATA:  Recent fall with hand pain, initial encounter EXAM: RIGHT HAND - COMPLETE 3+ VIEW COMPARISON:  None Available. FINDINGS: Oblique lucency is noted in the fifth metacarpal with mild callus formation consistent with injury and healing. No acute fracture is noted. No soft tissue swelling is seen. Radiopaque density is noted adjacent to the first MCP joint consistent with foreign body this is of uncertain chronicity. IMPRESSION: Healing fifth  metacarpal fracture. No acute abnormality noted. Foreign body adjacent to the first metacarpal phalangeal joint of uncertain chronicity. Electronically Signed   By: Alcide Clever M.D.   On: 02/13/2023 19:01   DG Pelvis Portable  Result Date: 02/13/2023 CLINICAL DATA:  Pain. EXAM: PORTABLE PELVIS 1-2 VIEWS COMPARISON:  None Available. FINDINGS: There is no acute fracture or dislocation. Mild osteopenia. No significant arthritic changes. The soft tissues are unremarkable. Vascular calcifications noted. IMPRESSION: No acute fracture or dislocation. Electronically Signed   By: Elgie Collard M.D.   On: 02/13/2023 19:00   DG Chest Portable 1 View  Result Date: 02/13/2023 CLINICAL DATA:  Fall with chest pain, initial encounter EXAM: PORTABLE CHEST 1 VIEW COMPARISON:  10/30/2022 FINDINGS: Cardiac shadow is stable.  Aortic calcifications are noted. The lungs are well aerated bilaterally. Mild basilar atelectasis is seen. No effusion is noted. No bony abnormality is seen. IMPRESSION: Mild bibasilar atelectasis. Electronically Signed   By: Alcide Clever M.D.   On: 02/13/2023 18:59    Procedures Procedures    Medications Ordered in ED Medications - No data to display  ED Course/ Medical Decision Making/ A&P                             Medical Decision Making Amount and/or Complexity of Data Reviewed Labs: ordered. Radiology: ordered.   Seth [name] is here after mechanical fall.  History of stroke with right-sided deficits and uses a cane to walk.  History of COPD and seizures.  History of hypertension as well.  Does not appear that he is on a blood thinner.  Abrasion to his right hand, right side of his head.  Will get CT scan of head and neck and x-ray of the right hand and basic labs to evaluate.  Overall sounds like mechanical fall.  He mitts alcohol use today.  Neurologically appears to be intact.  He has no abdominal pain or pain elsewhere.  Lab work unremarkable per my review and  interpretation.  Alcohol level mildly elevated.  CT of head and neck per radiology reports unremarkable.  X-ray of the right hand with no fracture or malalignment per my review and interpretation.  Looks like he has a chronic foreign body at the base of his thumb.  No laceration there.  Try to touch base with family.  No one picking up.  He is okay for discharge.  Will have social work/case management arranged for family to pick him up.  This chart was dictated using voice recognition software.  Despite best efforts to proofread,  errors can occur which can change the documentation meaning.         Final Clinical Impression(s) / ED Diagnoses Final diagnoses:  Fall, initial encounter  Abrasion    Rx / DC Orders ED Discharge Orders     None         Virgina Norfolk, DO 02/13/23 2035

## 2023-02-13 NOTE — ED Notes (Signed)
Got hold of family, they came and got him. Performed wound care on his wounds and informed him how to care for them at home.

## 2023-02-13 NOTE — Progress Notes (Signed)
Orthopedic Tech Progress Note Patient Details:  TEIGEN BORELLO [birth year] [id number]  Level 2 trauma   Patient ID: Elwyn Reach, male   DOB: 1956/06/30, 67 y.o.   MRN: [id number]  Donald Pore 02/13/2023, 6:35 PM

## 2023-02-14 ENCOUNTER — Telehealth: Payer: Self-pay

## 2023-02-14 NOTE — Telephone Encounter (Signed)
(  3:50 pm) PC SW left a message for patient's sister-Ingrid requesting a call back to schedule a visit.

## 2023-02-16 ENCOUNTER — Other Ambulatory Visit: Payer: Medicare Other

## 2023-02-16 VITALS — BP 120/86 | HR 78 | Temp 98.7°F

## 2023-02-16 DIAGNOSIS — Z515 Encounter for palliative care: Secondary | ICD-10-CM

## 2023-02-16 NOTE — Progress Notes (Signed)
PATIENT NAME: Seth [name] DOB: 1956-05-06 MRN: [id number]  PRIMARY CARE PROVIDER: Sharmon Revere, MD  RESPONSIBLE PARTY:  Acct ID - Guarantor Home Phone Work Phone Relationship Acct Type  [id number] [name] Other* [phone]  Self P/F     [address], [place], [place] [address]    Palliative Care [facility] Encounter Note   Completed home visit with Clydia Llano, SW. [name] and Mom also present.     HISTORY OF PRESENT ILLNESS: Hx of CVA; right sided weakness; ETOH  TODAY'S VISIT: NAD during this visit, pt had an ED visit this week  Respiratory: LS clear, bases diminished; denies SOB  Cardiac: HR; no edema   Cognitive: alert and oriented; denies any confusion   Appetite: eats 3 meals a day, snacks daily, drinks 8-8oz glasses of water daily; has 3 alcoholic drinks  GI/GU: has a BM daily; no urinary issues   Mobility: has a cane and a [place]; mostly uses his cane as he feels it serves him better than the [place] even when outside the home; pt is trying to get a place of his own  ADLs: needs assist with shower, dressing; independent with grooming; has a shower chair   Sleeping Pattern: sometimes has difficulty getting to sleep but once asleep no issues, if he awakens to toilet he can fall back to sleep without difficulty  Pain: denies at this time  Wt: pt reports he gained 4 lbs and now weighs 174.3  Falls: last fall on Tuesday, 02/13/23 when he went to the ED   Goals of Care: To find his own apartment or room     Next Appt Scheduled For: SW to schedule next visit  Risk Score: medium     CODE STATUS: Full Code ADVANCED DIRECTIVES: N MOST FORM: N PPS: 50%   PHYSICAL EXAM:   VITALS: Today's Vitals   02/16/23 1039  BP: 120/86  Pulse: 78  Temp: 98.7 F (37.1 C)  SpO2: 94%  PainSc: 0-No pain    LUNGS: clear to auscultation , decreased breath sounds CARDIAC: Cor RRR EXTREMITIES: AROM x4; no edema SKIN: Skin color, texture, turgor  normal. No rashes or lesions  NEURO: negative       Rozetta Stumpp Clementeen Graham, LPN

## 2023-03-07 ENCOUNTER — Ambulatory Visit: Payer: Medicare Other | Admitting: Podiatry

## 2023-03-26 ENCOUNTER — Ambulatory Visit: Payer: Medicare Other | Admitting: Podiatry

## 2023-03-28 ENCOUNTER — Institutional Professional Consult (permissible substitution): Payer: Medicare Other | Admitting: Neurology

## 2023-03-30 ENCOUNTER — Encounter (HOSPITAL_COMMUNITY): Payer: Self-pay

## 2023-03-30 ENCOUNTER — Emergency Department (HOSPITAL_COMMUNITY)
Admission: EM | Admit: 2023-03-30 | Discharge: 2023-03-31 | Disposition: A | Discharge status: Home / Self Care | Payer: Medicare Other | Attending: Emergency Medicine | Admitting: Emergency Medicine

## 2023-03-30 ENCOUNTER — Other Ambulatory Visit: Payer: Self-pay

## 2023-03-30 DIAGNOSIS — Z79899 Other long term (current) drug therapy: Secondary | ICD-10-CM | POA: Diagnosis not present

## 2023-03-30 DIAGNOSIS — R251 Tremor, unspecified: Secondary | ICD-10-CM | POA: Diagnosis not present

## 2023-03-30 DIAGNOSIS — I10 Essential (primary) hypertension: Secondary | ICD-10-CM | POA: Diagnosis not present

## 2023-03-30 DIAGNOSIS — J449 Chronic obstructive pulmonary disease, unspecified: Secondary | ICD-10-CM | POA: Diagnosis not present

## 2023-03-30 DIAGNOSIS — R569 Unspecified convulsions: Secondary | ICD-10-CM | POA: Insufficient documentation

## 2023-03-30 LAB — BASIC METABOLIC PANEL
Anion gap: 13 (ref 5–15)
BUN: 17 mg/dL (ref 8–23)
CO2: 24 mmol/L (ref 22–32)
Calcium: 9.2 mg/dL (ref 8.9–10.3)
Chloride: 102 mmol/L (ref 98–111)
Creatinine, Ser: 1.25 mg/dL — ABNORMAL HIGH (ref 0.61–1.24)
GFR, Estimated: >60 mL/min (ref >60)
Glucose, Bld: 88 mg/dL (ref 70–99)
Potassium: 4.3 mmol/L (ref 3.5–5.1)
Sodium: 139 mmol/L (ref 135–145)

## 2023-03-30 LAB — CBC
HCT: 43.1 % (ref 39.0–52.0)
Hemoglobin: 13.8 g/dL (ref 13.0–17.0)
MCH: 28.2 pg (ref 26.0–34.0)
MCHC: 32 g/dL (ref 30.0–36.0)
MCV: 88.1 fL (ref 80.0–100.0)
Platelets: 229 10*3/uL (ref 150–400)
RBC: 4.89 MIL/uL (ref 4.22–5.81)
RDW: 15.8 % — ABNORMAL HIGH (ref 11.5–15.5)
WBC: 5 10*3/uL (ref 4.0–10.5)
nRBC: 0 % (ref 0.0–0.2)

## 2023-03-30 LAB — CBG MONITORING, ED: Glucose-Capillary: 95 mg/dL (ref 70–99)

## 2023-03-30 MED ORDER — LEVETIRACETAM IN NACL 1000 MG/100ML IV SOLN
1000.0000 mg | Freq: Once | INTRAVENOUS | Status: AC
  Administered 2023-03-30: 1000 mg via INTRAVENOUS
  Filled 2023-03-30: qty 100

## 2023-03-30 MED ORDER — SODIUM CHLORIDE 0.9 % IV SOLN: 20.0000 mg/kg | Freq: Once | INTRAVENOUS | Status: DC

## 2023-03-30 NOTE — ED Triage Notes (Addendum)
PER EMS: pt is from home with c/o dizziness and minor tremors to right arm, He reports this happens usually before he has a seizure. No seizure like activity at this time. Hx of seizures, takes Keppra but has not had it in two days. Hx of stoke with right sided weakness. He reports his last seizure was 2 months ago.  BP- 172/110, HR-81, 99%RA, RR12, CBG-73 20g LFA

## 2023-03-30 NOTE — ED Provider Notes (Signed)
[place] EMERGENCY DEPARTMENT AT [facility] Provider Note   CSN: [id number] Arrival date & time: 03/30/23  2226     History  Chief Complaint  Patient presents with   Tremors    Seth [name] is a 67 y.o. male.  The history is provided by the patient.  Illness Location:  Upper extremities for approximately 20 minutes Quality:  Tremors Severity:  Moderate Onset quality:  Sudden Timing:  Constant Progression:  Resolved Chronicity:  Recurrent Context:  Missed a day of his keppra for seizures Relieved by:  Nothing Worsened by:  Nothing Ineffective treatments:  None tried Associated symptoms: no chest pain, no congestion, no fever, no shortness of breath, no vomiting and no wheezing   Patient with seizures presents with tremors and these usually herald a seizure.      Past Medical History:  Diagnosis Date   COPD (chronic obstructive pulmonary disease) (HCC)    Depression    Enlarged prostate    Gout    Hypertension    Metabolic acidosis 07/19/2020   Seizures (HCC)    Stroke [facility])      Home Medications Prior to Admission medications   Medication Sig Start Date End Date Taking? Authorizing Provider  acetaminophen (TYLENOL) 325 MG tablet Take 2 tablets (650 mg total) by mouth every 6 (six) hours as needed for headache or mild pain. 10/18/21   Almon Hercules, MD  clonazePAM (KLONOPIN) 1 MG tablet Take 1 tablet (1 mg total) by mouth 2 (two) times daily. 12/24/22 02/22/23  Rhetta Mura, MD  EQ ASPIRIN ADULT LOW DOSE 81 MG tablet Take 81 mg by mouth daily. Patient not taking: Reported on 12/20/2022 10/09/22   [provider]  FEROSUL 325 (65 Fe) MG tablet Take 325 mg by mouth daily. 10/09/22   [provider]  fluticasone-salmeterol (ADVAIR) 500-50 MCG/ACT AEPB Inhale 1 puff into the lungs 2 (two) times daily. Patient not taking: Reported on 12/20/2022 04/13/21   [provider]  folic acid (FOLVITE) 1 MG tablet Take 1 mg by mouth  daily. 10/09/22   [provider]  gabapentin (NEURONTIN) 300 MG capsule Take 1 capsule (300 mg total) by mouth 3 (three) times daily. 12/24/22   Rhetta Mura, MD  INCRUSE ELLIPTA 62.5 MCG/INH AEPB INHALE 1 PUFF INTO THE LUNGS DAILY Patient not taking: Reported on 12/20/2022 04/08/19   Julieanne Manson, MD  Lacosamide 100 MG TABS Take 1 tablet (100 mg total) by mouth 2 (two) times daily. 12/24/22   Rhetta Mura, MD  levETIRAcetam (KEPPRA) 1000 MG tablet Take 2 tablets (2,000 mg total) by mouth 2 (two) times daily. 12/24/22   Rhetta Mura, MD  melatonin 3 MG TABS tablet Take 1 tablet (3 mg total) by mouth at bedtime as needed (insomnia). 12/22/22   Rhetta Mura, MD  metoprolol tartrate (LOPRESSOR) 25 MG tablet Take 0.5 tablets (12.5 mg total) by mouth 2 (two) times daily. 12/24/22   Rhetta Mura, MD  Multiple Vitamins-Minerals (DAILY VITAMIN FORMULA+MINERALS) TABS Take 1 tablet by mouth daily. 10/09/22   [provider]  nicotine (NICODERM CQ - DOSED IN MG/24 HOURS) 14 mg/24hr patch Place 1 patch (14 mg total) onto the skin daily as needed (smoking cessation). 12/22/22   Rhetta Mura, MD  omeprazole (PRILOSEC) 40 MG capsule Take 40 mg by mouth daily. Patient not taking: Reported on 12/20/2022 10/09/22   [provider]  PHENobarbital (LUMINAL) 64.8 MG tablet Take 1 tablet (64.8 mg total) by mouth 2 (two) times  daily. 12/24/22 02/22/23  Rhetta Mura, MD  rosuvastatin (CRESTOR) 10 MG tablet Take 1 tablet (10 mg total) by mouth daily. Patient not taking: Reported on 12/20/2022 09/28/20   Marinda Elk, MD  Zinc Sulfate 220 (50 Zn) MG TABS Take 1 tablet by mouth daily. 10/09/22   [provider]      Allergies    Patient has no known allergies.    Review of Systems   Review of Systems  Constitutional:  Negative for fever.  HENT:  Negative for congestion.   Eyes:  Negative for redness.  Respiratory:  Negative for  shortness of breath, wheezing and stridor.   Cardiovascular:  Negative for chest pain.  Gastrointestinal:  Negative for vomiting.  All other systems reviewed and are negative.   Physical Exam Updated Vital Signs BP (!) 159/91 (BP Location: Left Arm)   Pulse 84   Temp 98.1 F (36.7 C)   Resp 20   Ht 6\' 2"  (1.88 m)   Wt 78.9 kg   SpO2 97%   BMI 22.34 kg/m  Physical Exam Vitals and nursing note reviewed.  Constitutional:      General: He is not in acute distress.    Appearance: He is well-developed. He is not diaphoretic.  HENT:     Head: Normocephalic and atraumatic.     Nose: Nose normal.  Eyes:     Conjunctiva/sclera: Conjunctivae normal.     Pupils: Pupils are equal, round, and reactive to light.  Cardiovascular:     Rate and Rhythm: Normal rate and regular rhythm.     Pulses: Normal pulses.     Heart sounds: Normal heart sounds.  Pulmonary:     Effort: Pulmonary effort is normal.     Breath sounds: Normal breath sounds. No wheezing or rales.  Abdominal:     General: Bowel sounds are normal.     Palpations: Abdomen is soft.     Tenderness: There is no abdominal tenderness. There is no guarding or rebound.  Musculoskeletal:        General: Normal range of motion.     Cervical back: Normal range of motion and neck supple.  Skin:    General: Skin is warm and dry.     Capillary Refill: Capillary refill takes less than 2 seconds.  Neurological:     General: No focal deficit present.     Mental Status: He is alert and oriented to person, place, and time.     Deep Tendon Reflexes: Reflexes normal.  Psychiatric:        Mood and Affect: Mood normal.        Behavior: Behavior normal.     ED Results / Procedures / Treatments   Labs (all labs ordered are listed, but only abnormal results are displayed) Results for orders placed or performed during the hospital encounter of 03/30/23  Basic metabolic panel - if new onset seizures  Result Value Ref Range   Sodium 139  135 - 145 mmol/L   Potassium 4.3 3.5 - 5.1 mmol/L   Chloride 102 98 - 111 mmol/L   CO2 24 22 - 32 mmol/L   Glucose, Bld 88 70 - 99 mg/dL   BUN 17 8 - 23 mg/dL   Creatinine, Ser 7.82 (H) 0.61 - 1.24 mg/dL   Calcium 9.2 8.9 - 95.6 mg/dL   GFR, Estimated >21 >30 mL/min   Anion gap 13 5 - 15  CBC - if new onset seizures  Result Value Ref Range  WBC 5.0 4.0 - 10.5 K/uL   RBC 4.89 4.22 - 5.81 MIL/uL   Hemoglobin 13.8 13.0 - 17.0 g/dL   HCT 41.3 24.4 - 01.0 %   MCV 88.1 80.0 - 100.0 fL   MCH 28.2 26.0 - 34.0 pg   MCHC 32.0 30.0 - 36.0 g/dL   RDW 27.2 (H) 53.6 - 64.4 %   Platelets 229 150 - 400 K/uL   nRBC 0.0 0.0 - 0.2 %  CBG monitoring, ED  Result Value Ref Range   Glucose-Capillary 95 70 - 99 mg/dL   No results found.  EKG EKG Interpretation Date/Time:  Friday March 30 2023 22:38:25 EDT Ventricular Rate:  77 PR Interval:  194 QRS Duration:  76 QT Interval:  358 QTC Calculation: 405 R Axis:   42  Text Interpretation: Normal sinus rhythm Confirmed by Nicanor Alcon, Sandeep Radell ([id number]) on 03/30/2023 11:03:00 PM  Radiology No results found.  Procedures Procedures    Medications Ordered in ED Medications  levETIRAcetam (KEPPRA) IVPB 1000 mg/100 mL premix (has no administration in time range)    Followed by  levETIRAcetam (KEPPRA) IVPB 1000 mg/100 mL premix (has no administration in time range)    ED Course/ Medical Decision Making/ A&P                                 Medical Decision Making Patient with seizures who has missed a day of medication, with tremors which per report herald a seizure for the patient   Amount and/or Complexity of Data Reviewed Independent Historian: EMS    Details: See above  External Data Reviewed: notes.    Details: Previous notes reviewed  Labs: ordered.    Details: Normal white count 5, normal hemoglobin 13.8, normal platelet count.  Normal sodium 139, normal potassium 4.3, normal creatinine   Risk Prescription drug management. Risk  Details: Did not strike head.  No seizures.  Resting comfortably in bed, reloaded with keppra.  Observed in the ED stable for discharge.      Final Clinical Impression(s) / ED Diagnoses Return for intractable cough, coughing up blood, fevers > 100.4 unrelieved by medication, shortness of breath, intractable vomiting, chest pain, shortness of breath, weakness, numbness, changes in speech, facial asymmetry, abdominal pain, passing out, Inability to tolerate liquids or food, cough, altered mental status or any concerns. No signs of systemic illness or infection. The patient is nontoxic-appearing on exam and vital signs are within normal limits.  I have reviewed the triage vital signs and the nursing notes. Pertinent labs & imaging results that were available during my care of the patient were reviewed by me and considered in my medical decision making (see chart for details). After history, exam, and medical workup I feel the patient has been appropriately medically screened and is safe for discharge home. Pertinent diagnoses were discussed with the patient. Patient was given return precautions.   Jeanet Lupe, MD 03/31/23 [phone]

## 2023-03-30 NOTE — ED Notes (Signed)
Seizure pads placed on stretcher

## 2023-03-31 NOTE — Discharge Instructions (Signed)
No driving for 6 months or until cleared by neurology  

## 2023-03-31 NOTE — ED Notes (Signed)
[facility] called for transport  to address on file.

## 2023-04-21 ENCOUNTER — Other Ambulatory Visit: Payer: Self-pay

## 2023-04-21 ENCOUNTER — Encounter (HOSPITAL_COMMUNITY): Payer: Self-pay

## 2023-04-21 ENCOUNTER — Emergency Department (HOSPITAL_COMMUNITY): Payer: Medicare Other

## 2023-04-21 ENCOUNTER — Emergency Department (HOSPITAL_COMMUNITY)
Admission: EM | Admit: 2023-04-21 | Discharge: 2023-04-21 | Disposition: A | Discharge status: Home / Self Care | Payer: Medicare Other | Source: Home / Self Care | Attending: Emergency Medicine | Admitting: Emergency Medicine

## 2023-04-21 DIAGNOSIS — Z79899 Other long term (current) drug therapy: Secondary | ICD-10-CM | POA: Insufficient documentation

## 2023-04-21 DIAGNOSIS — M79671 Pain in right foot: Secondary | ICD-10-CM | POA: Diagnosis present

## 2023-04-21 DIAGNOSIS — M79672 Pain in left foot: Secondary | ICD-10-CM | POA: Diagnosis not present

## 2023-04-21 MED ORDER — ACETAMINOPHEN 325 MG PO TABS
650.0000 mg | ORAL_TABLET | Freq: Four times a day (QID) | ORAL | Status: DC | PRN
  Administered 2023-04-21: 650 mg via ORAL
  Filled 2023-04-21: qty 2

## 2023-04-21 NOTE — ED Triage Notes (Signed)
Bilateral foot pain since 5 am History Partial amputation left foot Denies injury to feet today Pain rated 10/10

## 2023-04-21 NOTE — Discharge Instructions (Signed)
Follow up with your Physician for recheck.  Return if any problems.  

## 2023-04-21 NOTE — ED Provider Notes (Signed)
[place] EMERGENCY DEPARTMENT AT [facility] Provider Note   CSN: [id number] Arrival date & time: 04/21/23  1429     History  Chief Complaint  Patient presents with   Foot Pain    Bilateral     Seth [name] is a 67 y.o. male.  Patient complains of pain in his feet.  Reports that he first noticed pain in his right foot yesterday.  Patient thought that he might have stepped on something are injured his foot.  He reports he has no recall of any injuries he has not had any falls.  Patient reports today he also began having pain in his left foot.  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  Patient is unable to tell me why he had to have an amputation.  Records indicate patient had osteomyelitis to his left foot previously.  Patient denies any fever or chills he denies any redness or swelling.  Patient noticed the discomfort mostly when he walks.  The history is provided by the patient. No language interpreter was used.  Foot Pain       Home Medications Prior to Admission medications   Medication Sig Start Date End Date Taking? Authorizing Provider  acetaminophen (TYLENOL) 325 MG tablet Take 2 tablets (650 mg total) by mouth every 6 (six) hours as needed for headache or mild pain. 10/18/21   Almon Hercules, MD  clonazePAM (KLONOPIN) 1 MG tablet Take 1 tablet (1 mg total) by mouth 2 (two) times daily. 12/24/22 02/22/23  Rhetta Mura, MD  EQ ASPIRIN ADULT LOW DOSE 81 MG tablet Take 81 mg by mouth daily. Patient not taking: Reported on 12/20/2022 10/09/22   [provider]  FEROSUL 325 (65 Fe) MG tablet Take 325 mg by mouth daily. 10/09/22   [provider]  fluticasone-salmeterol (ADVAIR) 500-50 MCG/ACT AEPB Inhale 1 puff into the lungs 2 (two) times daily. Patient not taking: Reported on 12/20/2022 04/13/21   [provider]  folic acid (FOLVITE) 1 MG tablet Take 1 mg by mouth daily. 10/09/22   [provider]   gabapentin (NEURONTIN) 300 MG capsule Take 1 capsule (300 mg total) by mouth 3 (three) times daily. 12/24/22   Rhetta Mura, MD  INCRUSE ELLIPTA 62.5 MCG/INH AEPB INHALE 1 PUFF INTO THE LUNGS DAILY Patient not taking: Reported on 12/20/2022 04/08/19   Julieanne Manson, MD  Lacosamide 100 MG TABS Take 1 tablet (100 mg total) by mouth 2 (two) times daily. 12/24/22   Rhetta Mura, MD  levETIRAcetam (KEPPRA) 1000 MG tablet Take 2 tablets (2,000 mg total) by mouth 2 (two) times daily. 12/24/22   Rhetta Mura, MD  melatonin 3 MG TABS tablet Take 1 tablet (3 mg total) by mouth at bedtime as needed (insomnia). 12/22/22   Rhetta Mura, MD  metoprolol tartrate (LOPRESSOR) 25 MG tablet Take 0.5 tablets (12.5 mg total) by mouth 2 (two) times daily. 12/24/22   Rhetta Mura, MD  Multiple Vitamins-Minerals (DAILY VITAMIN FORMULA+MINERALS) TABS Take 1 tablet by mouth daily. 10/09/22   [provider]  nicotine (NICODERM CQ - DOSED IN MG/24 HOURS) 14 mg/24hr patch Place 1 patch (14 mg total) onto the skin daily as needed (smoking cessation). 12/22/22   Rhetta Mura, MD  omeprazole (PRILOSEC) 40 MG capsule Take 40 mg by mouth daily. Patient not taking: Reported on 12/20/2022 10/09/22   [provider]  PHENobarbital (LUMINAL) 64.8 MG tablet Take 1 tablet (64.8 mg total) by mouth  2 (two) times daily. 12/24/22 02/22/23  Rhetta Mura, MD  rosuvastatin (CRESTOR) 10 MG tablet Take 1 tablet (10 mg total) by mouth daily. Patient not taking: Reported on 12/20/2022 09/28/20   Marinda Elk, MD  Zinc Sulfate 220 (50 Zn) MG TABS Take 1 tablet by mouth daily. 10/09/22   [provider]      Allergies    Patient has no known allergies.    Review of Systems   Review of Systems  All other systems reviewed and are negative.   Physical Exam Updated Vital Signs BP (!) 153/87 (BP Location: Left Arm)   Pulse 85   Temp 98.8 F (37.1 C) (Oral)    Resp 16   Ht 6\' 3"  (1.905 m)   Wt 82.6 kg   SpO2 98%   BMI 22.75 kg/m  Physical Exam Vitals reviewed.  Constitutional:      Appearance: Normal appearance.  Cardiovascular:     Rate and Rhythm: Normal rate.  Pulmonary:     Effort: Pulmonary effort is normal.  Abdominal:     General: Abdomen is flat.  Musculoskeletal:        General: Normal range of motion.     Comments: Right foot, no deformity,  no puncture wound seen, Left foot,  mid foot amputaion site well healed,  no sign of infection or swelling  nv and ns intact  Skin:    General: Skin is warm.  Neurological:     General: No focal deficit present.     Mental Status: He is alert.  Psychiatric:        Mood and Affect: Mood normal.     ED Results / Procedures / Treatments   Labs (all labs ordered are listed, but only abnormal results are displayed) Labs Reviewed - No data to display  EKG None  Radiology DG Foot Complete Left  Result Date: 04/21/2023 CLINICAL DATA:  Bilateral foot pain starting at 5 a.m. EXAM: LEFT FOOT - COMPLETE 3+ VIEW COMPARISON:  10/10/2021 FINDINGS: Amputation at the proximal to mid metatarsal level. Interval absence of most or all of the first metatarsal with a scalloped distal-medial appearance of the medial cuneiform, presumably from further amputation compared to 10/10/2021. No other substantial bony changes. Atheromatous vascular calcifications. Small plantar calcaneal spur. No significant gas tracks in the soft tissues. IMPRESSION: 1. Interval further amputation of the first metatarsal possibly of the distal-medial aspect of the medial cuneiform. 2. Atheromatous vascular calcifications. 3. Small plantar calcaneal spur. Electronically Signed   By: Gaylyn Rong M.D.   On: 04/21/2023 17:06   DG Foot Complete Right  Result Date: 04/21/2023 CLINICAL DATA:  Bilateral foot pain starting at 5 a.m. EXAM: RIGHT FOOT COMPLETE - 3+ VIEW COMPARISON:  None Available. FINDINGS: The toes are flexed  during imaging, making it difficult to evaluate the phalanges and interphalangeal joints. Cannot exclude hammertoe deformities. Atheromatous vascular calcifications. Suspected arthropathy of the third and fourth proximal interphalangeal joints. Small plantar calcaneal spur. IMPRESSION: 1. No acute bony findings. 2. Suspected arthropathy of the third and fourth proximal interphalangeal joints. 3. Atheromatous vascular calcifications. 4. Small plantar calcaneal spur. 5. Possible hammertoe deformities given the lack of extension of the interphalangeal joints. Electronically Signed   By: Gaylyn Rong M.D.   On: 04/21/2023 17:04    Procedures Procedures    Medications Ordered in ED Medications - No data to display  ED Course/ Medical Decision Making/ A&P  Medical Decision Making Amount and/or Complexity of Data Reviewed Radiology: ordered.   Pt given tylenol for discomfort.  Pt advised tylenol at home         Final Clinical Impression(s) / ED Diagnoses Final diagnoses:  Foot pain, left  Foot pain, right    Rx / DC Orders ED Discharge Orders     None     An After Visit Summary was printed and given to the patient.     Osie Cheeks 04/21/23 Rebbeca Paul, MD 04/22/23 2256

## 2023-04-21 NOTE — ED Notes (Signed)
Pt sister called to come pick him up. Advised she is on the way.

## 2023-06-09 ENCOUNTER — Emergency Department (HOSPITAL_COMMUNITY): Payer: Medicare Other

## 2023-06-09 ENCOUNTER — Encounter (HOSPITAL_COMMUNITY): Payer: Self-pay | Admitting: Emergency Medicine

## 2023-06-09 ENCOUNTER — Inpatient Hospital Stay (HOSPITAL_COMMUNITY)
Admission: EM | Admit: 2023-06-09 | Discharge: 2023-06-12 | DRG: 101 | Disposition: A | Discharge status: Home / Self Care | Payer: Medicare Other | Attending: Internal Medicine | Admitting: Internal Medicine

## 2023-06-09 ENCOUNTER — Other Ambulatory Visit: Payer: Self-pay

## 2023-06-09 DIAGNOSIS — K219 Gastro-esophageal reflux disease without esophagitis: Secondary | ICD-10-CM | POA: Diagnosis present

## 2023-06-09 DIAGNOSIS — G40011 Localization-related (focal) (partial) idiopathic epilepsy and epileptic syndromes with seizures of localized onset, intractable, with status epilepticus: Secondary | ICD-10-CM

## 2023-06-09 DIAGNOSIS — I10 Essential (primary) hypertension: Secondary | ICD-10-CM | POA: Diagnosis present

## 2023-06-09 DIAGNOSIS — F32A Depression, unspecified: Secondary | ICD-10-CM | POA: Diagnosis present

## 2023-06-09 DIAGNOSIS — R569 Unspecified convulsions: Secondary | ICD-10-CM

## 2023-06-09 DIAGNOSIS — R4701 Aphasia: Principal | ICD-10-CM

## 2023-06-09 DIAGNOSIS — Z8249 Family history of ischemic heart disease and other diseases of the circulatory system: Secondary | ICD-10-CM

## 2023-06-09 DIAGNOSIS — G40909 Epilepsy, unspecified, not intractable, without status epilepticus: Principal | ICD-10-CM | POA: Diagnosis present

## 2023-06-09 DIAGNOSIS — R531 Weakness: Secondary | ICD-10-CM

## 2023-06-09 DIAGNOSIS — U071 COVID-19: Principal | ICD-10-CM

## 2023-06-09 DIAGNOSIS — E785 Hyperlipidemia, unspecified: Secondary | ICD-10-CM | POA: Diagnosis present

## 2023-06-09 DIAGNOSIS — R2990 Unspecified symptoms and signs involving the nervous system: Secondary | ICD-10-CM | POA: Diagnosis present

## 2023-06-09 DIAGNOSIS — N179 Acute kidney failure, unspecified: Secondary | ICD-10-CM | POA: Diagnosis present

## 2023-06-09 DIAGNOSIS — I110 Hypertensive heart disease with heart failure: Secondary | ICD-10-CM | POA: Diagnosis present

## 2023-06-09 DIAGNOSIS — F1090 Alcohol use, unspecified, uncomplicated: Secondary | ICD-10-CM | POA: Insufficient documentation

## 2023-06-09 DIAGNOSIS — Z91199 Patient's noncompliance with other medical treatment and regimen due to unspecified reason: Secondary | ICD-10-CM

## 2023-06-09 DIAGNOSIS — E876 Hypokalemia: Secondary | ICD-10-CM | POA: Diagnosis not present

## 2023-06-09 DIAGNOSIS — I5032 Chronic diastolic (congestive) heart failure: Secondary | ICD-10-CM | POA: Diagnosis present

## 2023-06-09 DIAGNOSIS — Z7982 Long term (current) use of aspirin: Secondary | ICD-10-CM

## 2023-06-09 DIAGNOSIS — I69351 Hemiplegia and hemiparesis following cerebral infarction affecting right dominant side: Secondary | ICD-10-CM

## 2023-06-09 DIAGNOSIS — I69322 Dysarthria following cerebral infarction: Secondary | ICD-10-CM

## 2023-06-09 DIAGNOSIS — H5347 Heteronymous bilateral field defects: Secondary | ICD-10-CM | POA: Diagnosis present

## 2023-06-09 DIAGNOSIS — N400 Enlarged prostate without lower urinary tract symptoms: Secondary | ICD-10-CM | POA: Diagnosis present

## 2023-06-09 DIAGNOSIS — E875 Hyperkalemia: Secondary | ICD-10-CM | POA: Diagnosis present

## 2023-06-09 DIAGNOSIS — Z8673 Personal history of transient ischemic attack (TIA), and cerebral infarction without residual deficits: Secondary | ICD-10-CM

## 2023-06-09 DIAGNOSIS — F17210 Nicotine dependence, cigarettes, uncomplicated: Secondary | ICD-10-CM | POA: Diagnosis present

## 2023-06-09 DIAGNOSIS — J449 Chronic obstructive pulmonary disease, unspecified: Secondary | ICD-10-CM | POA: Diagnosis present

## 2023-06-09 DIAGNOSIS — Z79899 Other long term (current) drug therapy: Secondary | ICD-10-CM

## 2023-06-09 DIAGNOSIS — I69398 Other sequelae of cerebral infarction: Secondary | ICD-10-CM

## 2023-06-09 DIAGNOSIS — Z91148 Patient's other noncompliance with medication regimen for other reason: Secondary | ICD-10-CM

## 2023-06-09 DIAGNOSIS — F1010 Alcohol abuse, uncomplicated: Secondary | ICD-10-CM | POA: Diagnosis present

## 2023-06-09 DIAGNOSIS — I69320 Aphasia following cerebral infarction: Secondary | ICD-10-CM

## 2023-06-09 LAB — URINALYSIS, ROUTINE W REFLEX MICROSCOPIC
Bilirubin Urine: NEGATIVE
Glucose, UA: NEGATIVE mg/dL
Hgb urine dipstick: NEGATIVE
Ketones, ur: NEGATIVE mg/dL
Leukocytes,Ua: NEGATIVE
Nitrite: NEGATIVE
Protein, ur: NEGATIVE mg/dL
Specific Gravity, Urine: >1.046 — ABNORMAL HIGH (ref 1.005–1.030)
pH: 6 (ref 5.0–8.0)

## 2023-06-09 LAB — RAPID URINE DRUG SCREEN, HOSP PERFORMED
Amphetamines: NOT DETECTED
Barbiturates: POSITIVE — AB
Benzodiazepines: NOT DETECTED
Cocaine: NOT DETECTED
Opiates: NOT DETECTED
Tetrahydrocannabinol: NOT DETECTED

## 2023-06-09 LAB — COMPREHENSIVE METABOLIC PANEL
ALT: 22 U/L (ref 0–44)
AST: 33 U/L (ref 15–41)
Albumin: 3.7 g/dL (ref 3.5–5.0)
Alkaline Phosphatase: 152 U/L — ABNORMAL HIGH (ref 38–126)
Anion gap: 10 (ref 5–15)
BUN: 12 mg/dL (ref 8–23)
CO2: 24 mmol/L (ref 22–32)
Calcium: 8.7 mg/dL — ABNORMAL LOW (ref 8.9–10.3)
Chloride: 103 mmol/L (ref 98–111)
Creatinine, Ser: 1.32 mg/dL — ABNORMAL HIGH (ref 0.61–1.24)
GFR, Estimated: 60 mL/min — ABNORMAL LOW (ref >60)
Glucose, Bld: 89 mg/dL (ref 70–99)
Potassium: 5.2 mmol/L — ABNORMAL HIGH (ref 3.5–5.1)
Sodium: 137 mmol/L (ref 135–145)
Total Bilirubin: 1 mg/dL (ref 0.3–1.2)
Total Protein: 6.8 g/dL (ref 6.5–8.1)

## 2023-06-09 LAB — DIFFERENTIAL
Abs Immature Granulocytes: 0 10*3/uL (ref 0.00–0.07)
Basophils Absolute: 0.1 10*3/uL (ref 0.0–0.1)
Basophils Relative: 2 %
Eosinophils Absolute: 0.3 10*3/uL (ref 0.0–0.5)
Eosinophils Relative: 4 %
Lymphocytes Relative: 26 %
Lymphs Abs: 1.8 10*3/uL (ref 0.7–4.0)
Monocytes Absolute: 0.3 10*3/uL (ref 0.1–1.0)
Monocytes Relative: 5 %
Neutro Abs: 4.3 10*3/uL (ref 1.7–7.7)
Neutrophils Relative %: 63 %
nRBC: 0 /100{WBCs}

## 2023-06-09 LAB — PROTIME-INR
INR: 1 (ref 0.8–1.2)
Prothrombin Time: 13.6 s (ref 11.4–15.2)

## 2023-06-09 LAB — CBC
HCT: 42.9 % (ref 39.0–52.0)
Hemoglobin: 13.8 g/dL (ref 13.0–17.0)
MCH: 27.7 pg (ref 26.0–34.0)
MCHC: 32.2 g/dL (ref 30.0–36.0)
MCV: 86.1 fL (ref 80.0–100.0)
Platelets: 205 10*3/uL (ref 150–400)
RBC: 4.98 MIL/uL (ref 4.22–5.81)
RDW: 18 % — ABNORMAL HIGH (ref 11.5–15.5)
WBC: 6.8 10*3/uL (ref 4.0–10.5)
nRBC: 0 % (ref 0.0–0.2)

## 2023-06-09 LAB — I-STAT CHEM 8, ED
BUN: 15 mg/dL (ref 8–23)
Calcium, Ion: 1.07 mmol/L — ABNORMAL LOW (ref 1.15–1.40)
Chloride: 103 mmol/L (ref 98–111)
Creatinine, Ser: 1.1 mg/dL (ref 0.61–1.24)
Glucose, Bld: 87 mg/dL (ref 70–99)
HCT: 47 % (ref 39.0–52.0)
Hemoglobin: 16 g/dL (ref 13.0–17.0)
Potassium: 5.4 mmol/L — ABNORMAL HIGH (ref 3.5–5.1)
Sodium: 138 mmol/L (ref 135–145)
TCO2: 27 mmol/L (ref 22–32)

## 2023-06-09 LAB — ETHANOL: Alcohol, Ethyl (B): <10 mg/dL (ref <10)

## 2023-06-09 LAB — CK: Total CK: 187 U/L (ref 49–397)

## 2023-06-09 LAB — MAGNESIUM: Magnesium: 1.8 mg/dL (ref 1.7–2.4)

## 2023-06-09 LAB — PHENOBARBITAL LEVEL: Phenobarbital: 24.4 ug/mL (ref 15.0–40.0)

## 2023-06-09 LAB — APTT: aPTT: 31 s (ref 24–36)

## 2023-06-09 MED ORDER — SODIUM CHLORIDE 0.9 % IV SOLN
4500.0000 mg | Freq: Once | INTRAVENOUS | Status: AC
  Administered 2023-06-09: 4500 mg via INTRAVENOUS
  Filled 2023-06-09: qty 25

## 2023-06-09 MED ORDER — LACOSAMIDE 50 MG PO TABS
100.0000 mg | ORAL_TABLET | Freq: Two times a day (BID) | ORAL | Status: DC
  Administered 2023-06-10 – 2023-06-12 (×5): 100 mg via ORAL
  Filled 2023-06-09 (×5): qty 2

## 2023-06-09 MED ORDER — SODIUM CHLORIDE 0.9 % IV SOLN
100.0000 mg | Freq: Two times a day (BID) | INTRAVENOUS | Status: DC
  Filled 2023-06-09 (×4): qty 10

## 2023-06-09 MED ORDER — GABAPENTIN 300 MG PO CAPS
300.0000 mg | ORAL_CAPSULE | Freq: Three times a day (TID) | ORAL | Status: DC
  Administered 2023-06-09 – 2023-06-12 (×8): 300 mg via ORAL
  Filled 2023-06-09 (×8): qty 1

## 2023-06-09 MED ORDER — CLONAZEPAM 0.5 MG PO TABS
1.0000 mg | ORAL_TABLET | Freq: Two times a day (BID) | ORAL | Status: DC
  Administered 2023-06-10 – 2023-06-12 (×5): 1 mg via ORAL
  Filled 2023-06-09 (×5): qty 2

## 2023-06-09 MED ORDER — PHENOBARBITAL SODIUM 65 MG/ML IJ SOLN: 65.0000 mg | Freq: Two times a day (BID) | INTRAMUSCULAR | Status: DC | PRN

## 2023-06-09 MED ORDER — SODIUM CHLORIDE 0.9 % IV SOLN
2000.0000 mg | Freq: Two times a day (BID) | INTRAVENOUS | Status: DC
  Filled 2023-06-09 (×6): qty 20

## 2023-06-09 MED ORDER — PHENOBARBITAL 32.4 MG PO TABS
64.8000 mg | ORAL_TABLET | Freq: Two times a day (BID) | ORAL | Status: DC
  Administered 2023-06-09 – 2023-06-12 (×6): 64.8 mg via ORAL
  Filled 2023-06-09 (×6): qty 2

## 2023-06-09 MED ORDER — IOHEXOL 350 MG/ML SOLN
75.0000 mL | Freq: Once | INTRAVENOUS | Status: AC | PRN
  Administered 2023-06-09: 75 mL via INTRAVENOUS

## 2023-06-09 MED ORDER — CLONAZEPAM 0.5 MG PO TABS
2.0000 mg | ORAL_TABLET | Freq: Once | ORAL | Status: AC
  Administered 2023-06-09: 2 mg via ORAL
  Filled 2023-06-09: qty 4

## 2023-06-09 MED ORDER — SODIUM CHLORIDE 0.9% FLUSH: 3.0000 mL | Freq: Once | INTRAVENOUS | Status: DC

## 2023-06-09 MED ORDER — SODIUM CHLORIDE 0.9 % IV SOLN
200.0000 mg | Freq: Once | INTRAVENOUS | Status: AC
  Administered 2023-06-09: 200 mg via INTRAVENOUS
  Filled 2023-06-09: qty 20

## 2023-06-09 MED ORDER — LEVETIRACETAM 500 MG PO TABS
2000.0000 mg | ORAL_TABLET | Freq: Two times a day (BID) | ORAL | Status: DC
  Administered 2023-06-10 – 2023-06-12 (×5): 2000 mg via ORAL
  Filled 2023-06-09 (×5): qty 4

## 2023-06-09 NOTE — Code Documentation (Signed)
Stroke Response Nurse Documentation Code Documentation  Seth [name] is a 67 y.o. male arriving to [facility]  via [facility] on 06/09/23 with past medical hx of stroke with residual right-sided weakness, COPD, depression, gout, hypertension and seizures. On aspirin 81 mg daily. Code stroke was activated by EMS.   Patient from home where he was LKW at 1100 and now complaining of R sided weakness, headache . PT from home with hx of prior stroke and R sided deficits. Now c/o worsening R sided weakness and headache. Also found to have new R hemianopia.   Stroke team at the bedside on patient arrival. Labs drawn and patient cleared for CT. Patient to CT with team. NIHSS 6, see documentation for details and code stroke times.  The following imaging was completed:  CT Head and CTA. Patient is not a candidate for IV Thrombolytic due to outside window. Patient is not a candidate for IR due to no LVO on imaging.   Care Plan: q2h vitals and neuro checks. MRI  Bedside handoff with ED RN Florentina Addison.    Mordecai Rasmussen  Stroke Response RN

## 2023-06-09 NOTE — ED Notes (Signed)
Patient transported to MRI 

## 2023-06-09 NOTE — Consult Note (Addendum)
Neurology Consultation  Reason for Consult: Worsening right-sided weakness and right hemianopsia Referring Physician: Dr. Jeraldine Loots  CC: Worsening right-sided weakness  History is obtained from: Patient, EMS and chart  HPI: Seth [name] is a 67 y.o. male with history of stroke with residual right-sided weakness, COPD, depression, gout, hypertension and seizures who presents with worsening right-sided weakness.  Patient states that he has had a stroke in the past and has worsening right-sided weakness but that it is worse today starting at 11:00, and that he has been dragging his right foot which he usually does not do.  He is noted to have right hemianopsia and says that this is new, but it has been seen documented in his exam in the past (typically noted after seizure activity and not present when he is doing well such as during outpatient follow-up).  Last seen for seizures 12/19/2022 at which time he was not taking his medications appropriately and medications at discharge were as follows:             1. Clonazepam 1mg  BID             2. Gabapentin 300mg  TID             3. Phenobarb 65mg  BID             4. Keppra 2000mg  BID             5. Vimpat 100mg  BID  He reports he has a blister pack of his medications to help his adherence, and that he only missed his middle of the day dose of medications yesterday but was otherwise adherent and that he has not drank since his last admission.  However on arrival of his sister at bedside she was able to call family and review his blister packs, noting that he last took his medications on 10/10 in the morning  He did endorse some right arm shaking and reports he thought that was a tremor.  He reports he thinks he has shaking of the right upper extremity approximately once a week lasting about 10 minutes at a time, and lasting only 5 minutes today.  However given his seizures his history is not reliable.  Family is unsure of baseline event frequency at  this time   LKW: 1100 TNK given?: no, outside of window IR Thrombectomy? No, no LVO Modified Rankin Scale: 2-Slight disability-UNABLE to perform all activities but does not need assistance  ROS: A complete ROS was performed and is negative except as noted in the HPI.   Past Medical History:  Diagnosis Date   COPD (chronic obstructive pulmonary disease) (HCC)    Depression    Enlarged prostate    Gout    Hypertension    Metabolic acidosis 07/19/2020   Seizures (HCC)    Stroke [facility])      Family History  Problem Relation Age of Onset   Hypertension Mother    Hypertension Father    Lung cancer Neg Hx    COPD Neg Hx      Social History:   reports that he has been smoking cigarettes. He started smoking about 44 years ago. He has a 22.4 pack-year smoking history. He has been exposed to tobacco smoke. He has never used smokeless tobacco. He reports current alcohol use. He reports that he does not currently use drugs after having used the following drugs: Marijuana.  Medications  Current Facility-Administered Medications:    sodium chloride flush (NS) 0.9 % injection 3  mL, 3 mL, Intravenous, Once, Gerhard Munch, MD  Current Outpatient Medications:    acetaminophen (TYLENOL) 325 MG tablet, Take 2 tablets (650 mg total) by mouth every 6 (six) hours as needed for headache or mild pain., Disp: , Rfl:    clonazePAM (KLONOPIN) 1 MG tablet, Take 1 tablet (1 mg total) by mouth 2 (two) times daily., Disp: 60 tablet, Rfl: 1   EQ ASPIRIN ADULT LOW DOSE 81 MG tablet, Take 81 mg by mouth daily. (Patient not taking: Reported on 12/20/2022), Disp: , Rfl:    FEROSUL 325 (65 Fe) MG tablet, Take 325 mg by mouth daily., Disp: , Rfl:    fluticasone-salmeterol (ADVAIR) 500-50 MCG/ACT AEPB, Inhale 1 puff into the lungs 2 (two) times daily. (Patient not taking: Reported on 12/20/2022), Disp: , Rfl:    folic acid (FOLVITE) 1 MG tablet, Take 1 mg by mouth daily., Disp: , Rfl:    gabapentin (NEURONTIN)  300 MG capsule, Take 1 capsule (300 mg total) by mouth 3 (three) times daily., Disp: 90 capsule, Rfl: 1   INCRUSE ELLIPTA 62.5 MCG/INH AEPB, INHALE 1 PUFF INTO THE LUNGS DAILY (Patient not taking: Reported on 12/20/2022), Disp: 2 each, Rfl: 0   Lacosamide 100 MG TABS, Take 1 tablet (100 mg total) by mouth 2 (two) times daily., Disp: 60 tablet, Rfl: 2   levETIRAcetam (KEPPRA) 1000 MG tablet, Take 2 tablets (2,000 mg total) by mouth 2 (two) times daily., Disp: 120 tablet, Rfl: 2   melatonin 3 MG TABS tablet, Take 1 tablet (3 mg total) by mouth at bedtime as needed (insomnia)., Disp: , Rfl: 0   metoprolol tartrate (LOPRESSOR) 25 MG tablet, Take 0.5 tablets (12.5 mg total) by mouth 2 (two) times daily., Disp: 60 tablet, Rfl: 0   Multiple Vitamins-Minerals (DAILY VITAMIN FORMULA+MINERALS) TABS, Take 1 tablet by mouth daily., Disp: , Rfl:    nicotine (NICODERM CQ - DOSED IN MG/24 HOURS) 14 mg/24hr patch, Place 1 patch (14 mg total) onto the skin daily as needed (smoking cessation)., Disp: 28 patch, Rfl: 0   omeprazole (PRILOSEC) 40 MG capsule, Take 40 mg by mouth daily. (Patient not taking: Reported on 12/20/2022), Disp: , Rfl:    PHENobarbital (LUMINAL) 64.8 MG tablet, Take 1 tablet (64.8 mg total) by mouth 2 (two) times daily., Disp: 60 tablet, Rfl: 1   rosuvastatin (CRESTOR) 10 MG tablet, Take 1 tablet (10 mg total) by mouth daily. (Patient not taking: Reported on 12/20/2022), Disp: , Rfl:    Zinc Sulfate 220 (50 Zn) MG TABS, Take 1 tablet by mouth daily., Disp: , Rfl:    Exam: Current vital signs: BP (!) 185/98 (BP Location: Left Arm)   Pulse 76   Temp 98.5 F (36.9 C) (Oral)   Resp 15   Ht 6\' 3"  (1.905 m)   Wt 76.8 kg   SpO2 100%   BMI 21.16 kg/m  Vital signs in last 24 hours: Temp:  [98.5 F (36.9 C)] 98.5 F (36.9 C) (10/12 1607) Pulse Rate:  [76-79] 76 (10/12 1609) Resp:  [15] 15 (10/12 1607) BP: (185-190)/(98-99) 185/98 (10/12 1609) SpO2:  [100 %] 100 % (10/12 1607) Weight:  [76.8  kg] 76.8 kg (10/12 1535)  GENERAL: Awake, alert, in no acute distress Psych: Affect appropriate for situation, patient is calm and cooperative with examination Head: Normocephalic and atraumatic, without obvious abnormality EENT: Normal conjunctivae, dry mucous membranes, no OP obstruction LUNGS: Normal respiratory effort. Non-labored breathing on room air CV: Regular rate and rhythm on telemetry  ABDOMEN: Soft, non-tender, non-distended Extremities: warm, well perfused, without obvious deformity  NEURO:  Mental Status: Awake, alert, and oriented to person, place, time, and situation. He is not able to provide a clear and coherent history of present illness. Speech/Language: speech is dysarthric with some aphasia.   Speech is dysarthric with some deficits in naming No neglect is noted Cranial Nerves:  II: PERRL right hemianopsia III, IV, VI: EOMI. Lid elevation symmetric and full.  V: Sensation is intact to light touch and symmetrical to face.  VII: Face is symmetrical resting and smiling VIII: Hearing intact to voice IX, X: Voice is slightly dysarthric XII: Tongue protrudes midline without fasciculations.   Motor: 5/5 strength to left upper and lower extremity, 4/5 strength to right upper and lower extremities Tone is normal. Bulk is normal.  Sensation: Intact to light touch bilaterally in all four extremities.  Coordination: FTN intact on the left, unable to perform on the right Gait: Deferred  NIHSS: 1a Level of Conscious.: 0 1b LOC Questions: 0 1c LOC Commands: 0 2 Best Gaze: 0 3 Visual: 2 4 Facial Palsy: 0 5a Motor Arm - left: 0 5b Motor Arm - Right: 0 6a Motor Leg - Left: 0 6b Motor Leg - Right: 0 7 Limb Ataxia: 0 8 Sensory: 1 9 Best Language: 2 10 Dysarthria: 1 11 Extinct. and Inatten.: 0 TOTAL: 6  On later attending evaluation at 8:30 PM and 9:30 PM initially only had a right upper quadrantanopia but then had again a full right hemianopia.  Did have drift of  the right arm and right leg, sensory impairment on the right side, some neglect of the right side but improving aphasia NIH 6 for right upper quadrantanopia, right arm drift, right leg drift, right-sided sensory loss, dysarthria, neglect of the right side (1.8) He additionally had a brief approximately 30 second episode of right hand twitching (consistent with his focal seizures in the past) witnessed at about 9:30 PM, that neither patient nor sister at bedside with aware of until I called their attention to it   Labs I have reviewed labs in epic and the results pertinent to this consultation are:   Basic Metabolic Panel: Recent Labs  Lab 06/09/23 1530 06/09/23 1552  NA 137 138  K 5.2* 5.4*  CL 103 103  CO2 24  --   GLUCOSE 89 87  BUN 12 15  CREATININE 1.32* 1.10  CALCIUM 8.7*  --     CBC: Recent Labs  Lab 06/09/23 1530 06/09/23 1552  WBC 6.8  --   NEUTROABS 4.3  --   HGB 13.8 16.0  HCT 42.9 47.0  MCV 86.1  --   PLT 205  --     Coagulation Studies: Recent Labs    06/09/23 1530  LABPROT 13.6  INR 1.0    Component Ref Range & Units 15:30 3 mo ago 4 mo ago 5 mo ago 6 mo ago 7 mo ago 8 mo ago  Alcohol, Ethyl (B) <10 mg/dL <16 59 High  CM <10 CM 134 High  CM <10 CM <10 CM 81 High      Lipid Panel     Component Value Date/Time   CHOL 115 10/13/2021 0058   TRIG 50 10/13/2021 0058   HDL 42 10/13/2021 0058   CHOLHDL 2.7 10/13/2021 0058   VLDL 10 10/13/2021 0058   LDLCALC 63 10/13/2021 0058     Imaging I have reviewed the images obtained:  CT-scan of the brain: No acute  abnormality, background of chronic small vessel disease both lacunar infarcts in bilateral cerebellar hemispheres and left temporal lobe, 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 12 mm focus of mild diffusion signal abnormality involving the left posterior splenium, likely reflecting a small evolving subacute small vessel infarct. No associated hemorrhage or mass effect. [On MD review favor this to be an artifact, or as noted above at best a subacute small vessel infarct] 2. No other acute intracranial abnormality. 3. Otherwise stable appearance of the brain with multiple chronic findings as detailed above.   Assessment: 67 year old patient with history of stroke with residual right-sided weakness, COPD, depression, gout, hypertension and seizures presents with right-sided weakness which she states is worsening from his baseline.  On exam, he is found to have mild dysarthria and aphasia as well as right hemianopsia, all of which has been present before.  CT head reveals no acute abnormality, and CT angiogram reveals no LVO.  Will proceed with MRI of the brain and stroke workup if positive, but suspect that patient's symptoms are simply recrudescence of his old stroke symptoms versus secondary to a breakthrough seizure.  Impression: Likely recrudescence of old stroke symptoms  Initial recommendations: -Brain MRI -Admit for stroke workup if positive  Further recommendations on reevaluation:  Assessment: Breakthrough seizures in the setting of medication nonadherence, suspect focal status given further witnessed twitching activity and fluctuating right hemianopia without return to baseline.  I favor the MRI brain findings to be artifactual, and therefore will not change stroke management at this time - Phenobarbital level ordered and resulted 24.4, Keppra level pending - Magnesium, CK ordered, resulted 1.8 and 187 respectively - LTM EEG - Keppra 4.5 g load, then resume 2000 mg BID home dose  - Vimpat 200 mg loading dose followed by home dose of 100 mg twice daily - Continue phenobarbital home dose 65 mg  twice daily given he is within the therapeutic range - Clonazepam 2 mg loading dose then 1 mg twice daily - Continue home gabapentin 300 mg 3 times daily - Please note, Keppra, Vimpat and phenobarbital have been ordered p.o. or IV to consider IV fluids given nationwide shortage - Clonazepam and gabapentin are only available p.o. - Please notify neurology for any breakthrough seizures or if patient's mental status is declining - Appreciate management of comorbidities per primary team, discussed with primary team via secure chat  Pt seen by NP/Neuro and later by MD. Note/plan to be edited by MD as needed.  Cortney E Ernestina Columbia , MSN, AGACNP-BC [facility] See Amion for schedule and pager information 06/09/2023 4:38 PM   Initially staffed with Dr. Derry Lory; further evaluation by myself after shift change due to large volume of emergent consults during the day  Attending Neurologist's note:  I personally saw this patient, gathering history, performing a full neurologic examination, reviewing relevant labs, personally reviewing relevant imaging including MRI brain, and formulated the assessment and plan, adding the note above for completeness and clarity to accurately reflect my thoughts  45 minutes of critical care time provided by myself  Brooke Dare MD-PhD [facility] [phone] Available 7 PM to 7 AM, outside of these hours please call Neurologist on call as listed on Amion.

## 2023-06-09 NOTE — Progress Notes (Signed)
Called ED, no response, attempting eeg

## 2023-06-09 NOTE — ED Triage Notes (Signed)
Per [facility] pt coming from home with worsening right sided weakness, headache and dizziness with LKN 11 am today. Family reports patient also dragging right foot. 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

## 2023-06-09 NOTE — ED Provider Notes (Signed)
[place] EMERGENCY DEPARTMENT AT [facility] Provider Note   CSN: [id number] Arrival date & time: 06/09/23  1528  An emergency department physician performed an initial assessment on this suspected stroke patient at 1531.  History  Chief Complaint  Patient presents with   Code Stroke    Seth [name] is a 67 y.o. male.  HPI Presents as a code stroke.  History is notable for multiple prior strokes, other medical problems, including hypertension, seizure.  Seemingly last seen normal was 4.5 hours prior to ED arrival.  Since that time patient has had right-sided weakness, headache, dizziness.  Family reported to EMS the patient was also dragging his right foot.  However, the patient also has known right-sided deficits from prior stroke.      Home Medications Prior to Admission medications   Medication Sig Start Date End Date Taking? Authorizing Provider  acetaminophen (TYLENOL) 325 MG tablet Take 2 tablets (650 mg total) by mouth every 6 (six) hours as needed for headache or mild pain. 10/18/21   Almon Hercules, MD  clonazePAM (KLONOPIN) 1 MG tablet Take 1 tablet (1 mg total) by mouth 2 (two) times daily. 12/24/22 02/22/23  Rhetta Mura, MD  EQ ASPIRIN ADULT LOW DOSE 81 MG tablet Take 81 mg by mouth daily. Patient not taking: Reported on 12/20/2022 10/09/22   [provider]  FEROSUL 325 (65 Fe) MG tablet Take 325 mg by mouth daily. 10/09/22   [provider]  fluticasone-salmeterol (ADVAIR) 500-50 MCG/ACT AEPB Inhale 1 puff into the lungs 2 (two) times daily. Patient not taking: Reported on 12/20/2022 04/13/21   [provider]  folic acid (FOLVITE) 1 MG tablet Take 1 mg by mouth daily. 10/09/22   [provider]  gabapentin (NEURONTIN) 300 MG capsule Take 1 capsule (300 mg total) by mouth 3 (three) times daily. 12/24/22   Rhetta Mura, MD  INCRUSE ELLIPTA 62.5 MCG/INH AEPB INHALE 1 PUFF INTO THE LUNGS DAILY Patient not taking:  Reported on 12/20/2022 04/08/19   Julieanne Manson, MD  Lacosamide 100 MG TABS Take 1 tablet (100 mg total) by mouth 2 (two) times daily. 12/24/22   Rhetta Mura, MD  levETIRAcetam (KEPPRA) 1000 MG tablet Take 2 tablets (2,000 mg total) by mouth 2 (two) times daily. 12/24/22   Rhetta Mura, MD  melatonin 3 MG TABS tablet Take 1 tablet (3 mg total) by mouth at bedtime as needed (insomnia). 12/22/22   Rhetta Mura, MD  metoprolol tartrate (LOPRESSOR) 25 MG tablet Take 0.5 tablets (12.5 mg total) by mouth 2 (two) times daily. 12/24/22   Rhetta Mura, MD  Multiple Vitamins-Minerals (DAILY VITAMIN FORMULA+MINERALS) TABS Take 1 tablet by mouth daily. 10/09/22   [provider]  nicotine (NICODERM CQ - DOSED IN MG/24 HOURS) 14 mg/24hr patch Place 1 patch (14 mg total) onto the skin daily as needed (smoking cessation). 12/22/22   Rhetta Mura, MD  omeprazole (PRILOSEC) 40 MG capsule Take 40 mg by mouth daily. Patient not taking: Reported on 12/20/2022 10/09/22   [provider]  PHENobarbital (LUMINAL) 64.8 MG tablet Take 1 tablet (64.8 mg total) by mouth 2 (two) times daily. 12/24/22 02/22/23  Rhetta Mura, MD  rosuvastatin (CRESTOR) 10 MG tablet Take 1 tablet (10 mg total) by mouth daily. Patient not taking: Reported on 12/20/2022 09/28/20   Marinda Elk, MD  Zinc Sulfate 220 (50 Zn) MG TABS Take 1 tablet by mouth daily. 10/09/22   [provider]  Allergies    Patient has no known allergies.    Review of Systems   Review of Systems  Physical Exam Updated Vital Signs BP (!) 184/123   Pulse 93   Temp 98.5 F (36.9 C) (Oral)   Resp 13   Ht 6\' 3"  (1.905 m)   Wt 76.8 kg   SpO2 96%   BMI 21.16 kg/m  Physical Exam Vitals and nursing note reviewed.  Constitutional:      General: He is not in acute distress.    Appearance: He is well-developed. He is ill-appearing.  HENT:     Head: Normocephalic and atraumatic.  Eyes:      Conjunctiva/sclera: Conjunctivae normal.  Cardiovascular:     Rate and Rhythm: Normal rate and regular rhythm.  Pulmonary:     Effort: Pulmonary effort is normal. No respiratory distress.     Breath sounds: No stridor.  Abdominal:     General: There is no distension.  Musculoskeletal:     Comments: Substantial atrophy.  Skin:    General: Skin is warm and dry.  Neurological:     Mental Status: He is alert.     Comments: Speech is difficult to understand, but the patient does respond to questions.  No obvious facial asymmetry.  Right upper extremity diminished strength. Left-sided hemianopsia. Per stroke nurse NIH 6.      ED Results / Procedures / Treatments   Labs (all labs ordered are listed, but only abnormal results are displayed) Labs Reviewed  CBC - Abnormal; Notable for the following components:      Result Value   RDW 18.0 (*)    All other components within normal limits  COMPREHENSIVE METABOLIC PANEL - Abnormal; Notable for the following components:   Potassium 5.2 (*)    Creatinine, Ser 1.32 (*)    Calcium 8.7 (*)    Alkaline Phosphatase 152 (*)    GFR, Estimated 60 (*)    All other components within normal limits  I-STAT CHEM 8, ED - Abnormal; Notable for the following components:   Potassium 5.4 (*)    Calcium, Ion 1.07 (*)    All other components within normal limits  PROTIME-INR  APTT  DIFFERENTIAL  ETHANOL  CBG MONITORING, ED    EKG EKG Interpretation Date/Time:  Saturday June 09 2023 16:02:46 EDT Ventricular Rate:  80 PR Interval:  171 QRS Duration:  82 QT Interval:  365 QTC Calculation: 421 R Axis:   37  Text Interpretation: Sinus rhythm Anterior infarct, old Artifact Abnormal ECG Confirmed by Gerhard Munch [phone]) on 06/09/2023 5:13:22 PM  Radiology MR BRAIN WO CONTRAST  Result Date: 06/09/2023 CLINICAL DATA:  Follow-up examination for stroke. EXAM: MRI HEAD WITHOUT CONTRAST TECHNIQUE: Multiplanar, multiecho pulse sequences of  the brain and surrounding structures were obtained without intravenous contrast. COMPARISON:  Comparison made with prior CTs from earlier the same day as well as recent MRI from 12/20/2022. FINDINGS: Brain: Mildly advanced cerebral atrophy for age. Patchy T2/FLAIR hyperintensity involving the periventricular white matter, consistent with chronic small vessel ischemic disease. Chronic ischemic changes within the left cerebral hemisphere with asymmetric atrophy of the left temporal lobe and left hippocampal formation, stable. Associated ventricular prominence with ex vacuo dilatation of the left lateral ventricle without overt hydrocephalus, stable. Multiple small chronic bilateral cerebellar infarcts noted. Superficial siderosis overlying the left cerebral convexity, consistent with prior hemorrhage, stable. Subtle 12 blood focus of mild diffusion signal abnormality seen involving the left posterior splenium (series 5, image 80). No  discernible ADC correlate. Finding likely reflects a small evolving subacute small vessel infarct. No other evidence for acute or subacute ischemia. No acute intracranial hemorrhage. 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 No other mass lesion, mass effect or midline shift. No extra-axial fluid collection. Pituitary gland and suprasellar region within normal limits. Vascular: Major intracranial vascular flow voids are maintained. Skull and upper cervical spine: Craniocervical junction within normal limits. Postoperative changes partially visualize within the cervical spine. Bone marrow signal intensity normal. No scalp soft tissue abnormality. Sinuses/Orbits: Globes orbital soft tissues within normal limits. Mild scattered mucosal thickening present about the frontoethmoidal and maxillary sinuses. No mastoid effusion. Other: Anterior IMPRESSION: 1. 12 mm focus of mild diffusion signal abnormality involving the left posterior splenium, likely reflecting a  small evolving subacute small vessel infarct. No associated hemorrhage or mass effect. 2. No other acute intracranial abnormality. 3. Otherwise stable appearance of the brain with multiple chronic findings as detailed above. Electronically Signed   By: Rise Mu M.D.   On: 06/09/2023 19:44   CT ANGIO HEAD NECK W WO CM (CODE STROKE)  Result Date: 06/09/2023 CLINICAL DATA:  Code stroke EXAM: CT ANGIOGRAPHY HEAD AND NECK WITH AND WITHOUT CONTRAST TECHNIQUE: Multidetector CT imaging of the head and neck was performed using the standard protocol during bolus administration of intravenous contrast. Multiplanar CT image reconstructions and MIPs were obtained to evaluate the vascular anatomy. Carotid stenosis measurements (when applicable) are obtained utilizing NASCET criteria, using the distal internal carotid diameter as the denominator. RADIATION DOSE REDUCTION: This exam was performed according to the departmental dose-optimization program which includes automated exposure control, adjustment of the mA and/or kV according to patient size and/or use of iterative reconstruction technique. CONTRAST:  75mL OMNIPAQUE IOHEXOL 350 MG/ML SOLN COMPARISON:  CT head without contrast 06/09/2023. CT angio head and neck 12/20/2022. FINDINGS: CTA NECK FINDINGS Aortic arch: Atherosclerotic calcifications are present at the aortic arch. Great vessel origins demonstrate no significant atherosclerotic disease. Common origin of the left common carotid artery and innominate artery is noted. Right carotid system: Right common carotid artery is within normal limits. Atherosclerotic calcifications are present at the bifurcation and proximal right ICA without significant stenosis. 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 No change is present. Left carotid system: The left common carotid artery demonstrates some mural calcification without significant stenosis. Scattered calcifications are present at the  bifurcation and proximal left ICA without significant stenosis. A prominent medial pseudoaneurysm is thrombosed. Minimal narrowing of the negative cervical ICA is present at this level. Vertebral arteries: The vertebral arteries are hypoplastic bilaterally. Both vertebral arteries originate from the subclavian arteries. Focal calcification is present at the origin of the right vertebral artery, stable. No other significant stenosis is present in either vertebral artery in the neck. Skeleton: Stable anterior fusion is present at C3-4. Vertebral body heights and alignment are normal. No focal osseous lesions are present. The patient is edentulous. Other neck: Soft tissues the neck are otherwise unremarkable. Salivary glands are within normal limits. Thyroid is normal. No significant adenopathy is present. No focal mucosal or submucosal lesions are present. Upper chest: Centrilobular emphysematous changes are present both lung apices. No nodule or mass lesion is present. No focal airspace disease is present. Review of the MIP images confirms the above findings CTA HEAD FINDINGS Anterior circulation: Atherosclerotic calcifications are present within the cavernous internal carotid arteries bilaterally. No significant stenosis is present relative to the ICA termini. The A1  and M1 segments are normal. Anterior communicating artery is patent. The MCA bifurcations are within normal limits bilaterally. The ACA and MCA branch vessels are normal bilaterally. Posterior circulation: The PICA origins are visualized and normal. The vertebrobasilar junction and basilar artery are within normal limits. Basilar artery is small. It is centrally terminates at the superior cerebellar arteries. A very small right P1 segment is present. Fetal type posterior cerebral arteries are present bilaterally. The PCA branch vessels are normal bilaterally. Venous sinuses: The dural sinuses are patent. The straight sinus and deep cerebral veins are  intact. Cortical veins are within normal limits. No significant vascular malformation is evident. Anatomic variants: Fetal type posterior cerebral arteries bilaterally. Review of the MIP images confirms the above findings IMPRESSION: 1. No emergent large vessel occlusion. 2. 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 3. Stable atherosclerotic changes at the carotid bifurcations and cavernous internal carotid arteries bilaterally without significant stenosis. 4. Hypoplastic vertebral arteries bilaterally. 5. Fetal type posterior cerebral arteries bilaterally. 6.  Emphysema (ICD10-J43.9). Electronically Signed   By: Marin Roberts M.D.   On: 06/09/2023 16:14   CT HEAD CODE STROKE WO CONTRAST  Result Date: 06/09/2023 CLINICAL DATA:  Code stroke. Neuro deficit, acute, stroke suspected. EXAM: CT HEAD WITHOUT CONTRAST TECHNIQUE: Contiguous axial images were obtained from the base of the skull through the vertex without intravenous contrast. RADIATION DOSE REDUCTION: This exam was performed according to the departmental dose-optimization program which includes automated exposure control, adjustment of the mA and/or kV according to patient size and/or use of iterative reconstruction technique. COMPARISON:  Head CT 02/13/2023. FINDINGS: Brain: No acute hemorrhage. Unchanged background of moderate chronic small-vessel disease with old lacunar infarcts in the bilateral cerebellar hemispheres and old infarct in the posterior aspect of the mesial left temporal lobe. No new loss of gray-white differentiation. No acute hydrocephalus or extra-axial collection. 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 No mass effect or midline shift. Vascular: No hyperdense vessel or unexpected calcification. Skull: No calvarial fracture or suspicious bone lesion. Skull base is unremarkable. Sinuses/Orbits: Mild anterior ethmoid and inferior frontal sinus disease. Orbits are  unremarkable. Other: None. ASPECTS [facility] Stroke Program Early CT Score) - Ganglionic level infarction (caudate, lentiform nuclei, internal capsule, insula, M1-M3 cortex): 7 - Supraganglionic infarction (M4-M6 cortex): 3 Total score (0-10 with 10 being normal): 10 IMPRESSION: 1. No acute intracranial hemorrhage or evidence of acute large vessel territory infarct. ASPECT score is 10. 2. Unchanged background of moderate chronic small-vessel disease with old lacunar infarcts in the bilateral cerebellar hemispheres and old infarct in the posterior aspect of the mesial left temporal lobe. 3. 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 Code stroke imaging results were communicated on 06/09/2023 at 3:48 pm to provider Dr. Derry Lory via secure text paging. Electronically Signed   By: Orvan Falconer M.D.   On: 06/09/2023 15:48    Procedures Procedures    Medications Ordered in ED Medications  sodium chloride flush (NS) 0.9 % injection 3 mL (0 mLs Intravenous Hold 06/09/23 1611)  iohexol (OMNIPAQUE) 350 MG/ML injection 75 mL (75 mLs Intravenous Contrast Given 06/09/23 1553)    ED Course/ Medical Decision Making/ A&P                                 Medical Decision Making Patient with history of prior stroke, alcohol abuse, seizures presents with right-sided weakness.  Recrudescence versus stroke versus seizure.  No overt evidence of trauma.  Patient's case conducted with the neurology colleagues.  Patient hypertensive on arrival, not tachycardic, sinus 80s normal Pulse ox is 100% room air normal   Amount and/or Complexity of Data Reviewed Independent Historian: EMS External Data Reviewed: notes. Labs: ordered. Decision-making details documented in ED Course. Radiology: ordered and independent interpretation performed. Decision-making details documented in ED Course. ECG/medicine tests: ordered and independent interpretation performed. Decision-making details documented in ED  Course.  Risk Prescription drug management. Decision regarding hospitalization. Diagnosis or treatment significantly limited by social determinants of health.   7:49 PM MRI consistent with subacute infarct left posterior splint on.  Given his neurodeficits, history of stroke, as well as abnormal MRI I discussed his case with our neurologist, Dr.Bhagat, and we considered patient's history of stroke, seizure, today's presentation, she will repeat evaluation of the patient, disposition pending.         Final Clinical Impression(s) / ED Diagnoses Final diagnoses:  Aphasia  Weakness     Gerhard Munch, MD 06/09/23 2013
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
## 2023-06-10 ENCOUNTER — Observation Stay (HOSPITAL_COMMUNITY): Payer: Medicare Other

## 2023-06-10 DIAGNOSIS — I5032 Chronic diastolic (congestive) heart failure: Secondary | ICD-10-CM | POA: Diagnosis present

## 2023-06-10 DIAGNOSIS — R4701 Aphasia: Secondary | ICD-10-CM | POA: Diagnosis present

## 2023-06-10 DIAGNOSIS — R569 Unspecified convulsions: Secondary | ICD-10-CM

## 2023-06-10 DIAGNOSIS — G40909 Epilepsy, unspecified, not intractable, without status epilepticus: Secondary | ICD-10-CM | POA: Diagnosis present

## 2023-06-10 DIAGNOSIS — F32A Depression, unspecified: Secondary | ICD-10-CM | POA: Diagnosis present

## 2023-06-10 DIAGNOSIS — N179 Acute kidney failure, unspecified: Secondary | ICD-10-CM | POA: Diagnosis present

## 2023-06-10 DIAGNOSIS — Z8249 Family history of ischemic heart disease and other diseases of the circulatory system: Secondary | ICD-10-CM | POA: Diagnosis not present

## 2023-06-10 DIAGNOSIS — H5347 Heteronymous bilateral field defects: Secondary | ICD-10-CM | POA: Diagnosis present

## 2023-06-10 DIAGNOSIS — I10 Essential (primary) hypertension: Secondary | ICD-10-CM

## 2023-06-10 DIAGNOSIS — I69320 Aphasia following cerebral infarction: Secondary | ICD-10-CM | POA: Diagnosis not present

## 2023-06-10 DIAGNOSIS — N400 Enlarged prostate without lower urinary tract symptoms: Secondary | ICD-10-CM | POA: Diagnosis present

## 2023-06-10 DIAGNOSIS — E785 Hyperlipidemia, unspecified: Secondary | ICD-10-CM | POA: Diagnosis present

## 2023-06-10 DIAGNOSIS — Z7982 Long term (current) use of aspirin: Secondary | ICD-10-CM | POA: Diagnosis not present

## 2023-06-10 DIAGNOSIS — Z91148 Patient's other noncompliance with medication regimen for other reason: Secondary | ICD-10-CM | POA: Diagnosis not present

## 2023-06-10 DIAGNOSIS — K219 Gastro-esophageal reflux disease without esophagitis: Secondary | ICD-10-CM | POA: Diagnosis present

## 2023-06-10 DIAGNOSIS — I110 Hypertensive heart disease with heart failure: Secondary | ICD-10-CM | POA: Diagnosis present

## 2023-06-10 DIAGNOSIS — F17210 Nicotine dependence, cigarettes, uncomplicated: Secondary | ICD-10-CM | POA: Diagnosis present

## 2023-06-10 DIAGNOSIS — E875 Hyperkalemia: Secondary | ICD-10-CM | POA: Diagnosis present

## 2023-06-10 DIAGNOSIS — Z79899 Other long term (current) drug therapy: Secondary | ICD-10-CM | POA: Diagnosis not present

## 2023-06-10 DIAGNOSIS — Z91199 Patient's noncompliance with other medical treatment and regimen due to unspecified reason: Secondary | ICD-10-CM | POA: Diagnosis not present

## 2023-06-10 DIAGNOSIS — J449 Chronic obstructive pulmonary disease, unspecified: Secondary | ICD-10-CM | POA: Diagnosis present

## 2023-06-10 DIAGNOSIS — I69351 Hemiplegia and hemiparesis following cerebral infarction affecting right dominant side: Secondary | ICD-10-CM | POA: Diagnosis not present

## 2023-06-10 DIAGNOSIS — E876 Hypokalemia: Secondary | ICD-10-CM | POA: Diagnosis not present

## 2023-06-10 DIAGNOSIS — F1090 Alcohol use, unspecified, uncomplicated: Secondary | ICD-10-CM

## 2023-06-10 DIAGNOSIS — I69398 Other sequelae of cerebral infarction: Secondary | ICD-10-CM | POA: Diagnosis not present

## 2023-06-10 DIAGNOSIS — F1010 Alcohol abuse, uncomplicated: Secondary | ICD-10-CM | POA: Diagnosis present

## 2023-06-10 DIAGNOSIS — I69322 Dysarthria following cerebral infarction: Secondary | ICD-10-CM | POA: Diagnosis not present

## 2023-06-10 LAB — BASIC METABOLIC PANEL
Anion gap: 11 (ref 5–15)
BUN: 9 mg/dL (ref 8–23)
CO2: 22 mmol/L (ref 22–32)
Calcium: 8.6 mg/dL — ABNORMAL LOW (ref 8.9–10.3)
Chloride: 104 mmol/L (ref 98–111)
Creatinine, Ser: 0.94 mg/dL (ref 0.61–1.24)
GFR, Estimated: >60 mL/min (ref >60)
Glucose, Bld: 85 mg/dL (ref 70–99)
Potassium: 3.4 mmol/L — ABNORMAL LOW (ref 3.5–5.1)
Sodium: 137 mmol/L (ref 135–145)

## 2023-06-10 MED ORDER — POTASSIUM CHLORIDE CRYS ER 20 MEQ PO TBCR
40.0000 meq | EXTENDED_RELEASE_TABLET | Freq: Once | ORAL | Status: AC
  Administered 2023-06-10: 40 meq via ORAL
  Filled 2023-06-10: qty 2

## 2023-06-10 MED ORDER — NICOTINE 14 MG/24HR TD PT24
14.0000 mg | MEDICATED_PATCH | Freq: Every day | TRANSDERMAL | Status: DC
  Administered 2023-06-10 – 2023-06-12 (×3): 14 mg via TRANSDERMAL
  Filled 2023-06-10 (×3): qty 1

## 2023-06-10 MED ORDER — LORAZEPAM 1 MG PO TABS: 1.0000 mg | ORAL_TABLET | ORAL | Status: DC | PRN

## 2023-06-10 MED ORDER — ENOXAPARIN SODIUM 40 MG/0.4ML IJ SOSY
40.0000 mg | PREFILLED_SYRINGE | INTRAMUSCULAR | Status: DC
  Administered 2023-06-10 – 2023-06-12 (×3): 40 mg via SUBCUTANEOUS
  Filled 2023-06-10 (×3): qty 0.4

## 2023-06-10 MED ORDER — PNEUMOCOCCAL 20-VAL CONJ VACC 0.5 ML IM SUSY
0.5000 mL | PREFILLED_SYRINGE | INTRAMUSCULAR | Status: DC
  Filled 2023-06-10: qty 0.5

## 2023-06-10 MED ORDER — ACETAMINOPHEN 325 MG PO TABS: 650.0000 mg | ORAL_TABLET | Freq: Four times a day (QID) | ORAL | Status: DC | PRN

## 2023-06-10 MED ORDER — ACETAMINOPHEN 650 MG RE SUPP: 650.0000 mg | Freq: Four times a day (QID) | RECTAL | Status: DC | PRN

## 2023-06-10 MED ORDER — ATORVASTATIN CALCIUM 40 MG PO TABS
40.0000 mg | ORAL_TABLET | Freq: Every evening | ORAL | Status: DC
  Administered 2023-06-10 – 2023-06-11 (×2): 40 mg via ORAL
  Filled 2023-06-10 (×2): qty 1

## 2023-06-10 MED ORDER — ADULT MULTIVITAMIN W/MINERALS CH
1.0000 | ORAL_TABLET | Freq: Every day | ORAL | Status: DC
  Administered 2023-06-10 – 2023-06-12 (×3): 1 via ORAL
  Filled 2023-06-10 (×3): qty 1

## 2023-06-10 MED ORDER — INSULIN ASPART 100 UNIT/ML IJ SOLN: 0.0000 [IU] | Freq: Every day | INTRAMUSCULAR | Status: DC

## 2023-06-10 MED ORDER — INFLUENZA VAC A&B SURF ANT ADJ 0.5 ML IM SUSY
0.5000 mL | PREFILLED_SYRINGE | INTRAMUSCULAR | Status: DC
  Filled 2023-06-10: qty 0.5

## 2023-06-10 MED ORDER — INSULIN ASPART 100 UNIT/ML IJ SOLN: 0.0000 [IU] | Freq: Three times a day (TID) | INTRAMUSCULAR | Status: DC

## 2023-06-10 MED ORDER — THIAMINE MONONITRATE 100 MG PO TABS
100.0000 mg | ORAL_TABLET | Freq: Every day | ORAL | Status: DC
  Administered 2023-06-10 – 2023-06-12 (×3): 100 mg via ORAL
  Filled 2023-06-10 (×3): qty 1

## 2023-06-10 MED ORDER — HYDRALAZINE HCL 20 MG/ML IJ SOLN
5.0000 mg | INTRAMUSCULAR | Status: DC | PRN
  Administered 2023-06-11: 5 mg via INTRAVENOUS
  Filled 2023-06-10 (×2): qty 1

## 2023-06-10 MED ORDER — PANTOPRAZOLE SODIUM 40 MG PO TBEC
40.0000 mg | DELAYED_RELEASE_TABLET | Freq: Every day | ORAL | Status: DC
  Administered 2023-06-10 – 2023-06-12 (×3): 40 mg via ORAL
  Filled 2023-06-10 (×3): qty 1

## 2023-06-10 MED ORDER — THIAMINE HCL 100 MG/ML IJ SOLN
100.0000 mg | Freq: Every day | INTRAMUSCULAR | Status: DC
  Filled 2023-06-10: qty 2

## 2023-06-10 MED ORDER — METOPROLOL TARTRATE 12.5 MG HALF TABLET
12.5000 mg | ORAL_TABLET | Freq: Two times a day (BID) | ORAL | Status: DC
  Administered 2023-06-10 – 2023-06-12 (×5): 12.5 mg via ORAL
  Filled 2023-06-10 (×5): qty 1

## 2023-06-10 MED ORDER — ASPIRIN 81 MG PO TBEC
81.0000 mg | DELAYED_RELEASE_TABLET | Freq: Every day | ORAL | Status: DC
  Administered 2023-06-10 – 2023-06-12 (×3): 81 mg via ORAL
  Filled 2023-06-10 (×3): qty 1

## 2023-06-10 MED ORDER — LORAZEPAM 2 MG/ML IJ SOLN: 1.0000 mg | INTRAMUSCULAR | Status: DC | PRN

## 2023-06-10 MED ORDER — FOLIC ACID 1 MG PO TABS
1.0000 mg | ORAL_TABLET | Freq: Every day | ORAL | Status: DC
  Administered 2023-06-10 – 2023-06-12 (×3): 1 mg via ORAL
  Filled 2023-06-10 (×3): qty 1

## 2023-06-10 NOTE — ED Notes (Signed)
Pt to [id number] via stretcher by transport. EEG tech at bedside w/pt as well.

## 2023-06-10 NOTE — Progress Notes (Signed)
Ed hookup, [facility] NOT moinitoring

## 2023-06-10 NOTE — Progress Notes (Signed)
Maint complete. P3 p4 and f4 was off/ very high. No SBD. Informed rn of call eeg phone # for when pt moves

## 2023-06-10 NOTE — Progress Notes (Signed)
LTM EEG disconnected - no skin breakdown at [facility].

## 2023-06-10 NOTE — Progress Notes (Addendum)
LTM EEG running - no initial skin breakdown - push button tested - neuro notified.  

## 2023-06-10 NOTE — Progress Notes (Signed)
NEUROLOGY CONSULT FOLLOW UP NOTE   Date of service: June 10, 2023 Patient Name: Seth [name] MRN:  [id number] DOB:  02-Jan-1956  Brief HPI  Seth [name] is a 67 y.o. male with breakthrough seizures in the setting of medication nonadherence, suspect focal status given further witnessed twitching activity and fluctuating right hemianopia without return to baseline.  Interval Hx/subjective   Counseled him on the importance of compliance with his seizure medications. R hemianopsia resolved. Back to his baseline. LTM EEG negative for seizures.  Vitals   Vitals:   06/10/23 0945 06/10/23 1024 06/10/23 1200 06/10/23 1543  BP:  (!) 175/102  (!) 146/82  Pulse:  67  70  Resp:  18 18 13   Temp: 98.1 F (36.7 C) 97.7 F (36.5 C)  97.9 F (36.6 C)  TempSrc: Oral Oral  Oral  SpO2:  97%    Weight:      Height:         Body mass index is 21.16 kg/m.  Physical Exam   Constitutional: Appears well-developed and well-nourished.  Psych: Affect appropriate to situation.  Eyes: No scleral injection.  HENT: No OP obstrucion.  Head: Normocephalic.  Cardiovascular: Normal rate and regular rhythm.  Respiratory: Effort normal, non-labored breathing.  GI: Soft.  No distension. There is no tenderness.  Skin: WDI.   Neurologic Examination  Mental status/Cognition: Alert, oriented to self, place, month and year, good attention.  Speech/language: Fluent, comprehension intact, object naming intact, repetition intact.  Cranial nerves:   CN II Pupils equal and reactive to light, no VF deficits    CN III,IV,VI EOM intact, no gaze preference or deviation, no nystagmus    CN V normal sensation in V1, V2, and V3 segments bilaterally    CN VII no asymmetry, no nasolabial fold flattening    CN VIII normal hearing to speech    CN IX & X normal palatal elevation, no uvular deviation    CN XI 5/5 head turn and 5/5 shoulder shrug bilaterally    CN XII midline tongue protrusion    Motor:  Muscle bulk:  poor, tone normal Mvmt Root Nerve  Muscle Right Left Comments  SA C5/6 Ax Deltoid     EF C5/6 Mc Biceps 4 5   EE C6/7/8 Rad Triceps 4 5   WF C6/7 Med FCR     WE C7/8 PIN ECU     F Ab C8/T1 U ADM/FDI 4 5   HF L1/2/3 Fem Illopsoas 4 5   KE L2/3/4 Fem Quad     DF L4/5 D Peron Tib Ant     PF S1/2 Tibial Grc/Sol       Sensation:  Light touch Decreased in RUE to light touch   Pin prick    Temperature    Vibration   Proprioception    Coordination/Complex Motor:  - Finger to Nose intact BL  Labs and Diagnostic Imaging   CBC:  Recent Labs  Lab 06/09/23 1530 06/09/23 1552  WBC 6.8  --   NEUTROABS 4.3  --   HGB 13.8 16.0  HCT 42.9 47.0  MCV 86.1  --   PLT 205  --     Basic Metabolic Panel:  Lab Results  Component Value Date   NA 137 06/10/2023   K 3.4 (L) 06/10/2023   CO2 22 06/10/2023   GLUCOSE 85 06/10/2023   BUN 9 06/10/2023   CREATININE 0.94 06/10/2023   CALCIUM 8.6 (L) 06/10/2023   GFRNONAA >60 06/10/2023  GFRAA >60 06/03/2019   Lipid Panel:  Lab Results  Component Value Date   LDLCALC 63 10/13/2021   HgbA1c:  Lab Results  Component Value Date   HGBA1C 5.0 05/18/2021   Urine Drug Screen:     Component Value Date/Time   LABOPIA NONE DETECTED 06/09/2023 2124   COCAINSCRNUR NONE DETECTED 06/09/2023 2124   LABBENZ NONE DETECTED 06/09/2023 2124   AMPHETMU NONE DETECTED 06/09/2023 2124   THCU NONE DETECTED 06/09/2023 2124   LABBARB POSITIVE (A) 06/09/2023 2124    Alcohol Level     Component Value Date/Time   ETH <10 06/09/2023 1530   INR  Lab Results  Component Value Date   INR 1.0 06/09/2023   APTT  Lab Results  Component Value Date   APTT 31 06/09/2023   AED levels:  Lab Results  Component Value Date   LAMOTRIGINE <1.0 (L) 01/31/2023   LEVETIRACETA <2.0 (L) 01/31/2023    MRI Brain(Personally reviewed): Likely artifact rather than acute stroke in left posterior splenium.  cEEG:  No seizures  Impression   Seth [name] is a  67 y.o. male with breakthrough seizures in the setting of medication nonadherence, suspect focal status given further witnessed twitching activity and fluctuating right hemianopia without return to baseline.   Recommendations  - counseled him on the importance of compliance with home AEDs. - continue home Keppra 2000mg  BID - cont home phenobarb 65mg  BID - cont home Vimpat 100mg  BID - cont home Gabapentin 300mg  TID - cont home Clonazepam 1mg  BID - discontinue LTM EEG. - continue Aspirin for secondary stroke prevention. - we will signoff. Please feel free to contact us with any questions or concerns. - follow up with neurology outpatient ______________________________________________________________________   Thank you for the opportunity to take part in the care of this patient. If you have any further questions, please contact the neurology consultation team on call. Updated oncall schedule is listed on AMION.  Signed,  Erick Blinks

## 2023-06-10 NOTE — Progress Notes (Signed)
[facility] PROGRESS NOTE   Seth [name] WGN:[id number] DOB: May 06, 1956 DOA: 06/09/2023  PCP: Sharmon Revere, MD  Brief History:  67 y.o. male with medical history significant of hypertension, stroke, COPD, chronic HFpEF, AAA, lung nodules, brain meningioma, subdural hemorrhage in May 2022, prior CVA with right-sided weakness, PAD, depression, BPH, gout, alcohol use disorder with history of alcohol withdrawal seizures, marijuana abuse, tobacco abuse presented to ED complaining of worsening right-sided weakness since 11 AM today.  He was noted to have mild dysarthria, aphasia, and right hemianopsia.  Seen by neurology who felt his symptoms were secondary to seizures.  He was hospitalized for further management.     Consultants: Neurology  Procedures: Continuous EEG    Subjective/Interval History: Patient slightly confused and distracted this morning but able to answer questions appropriately.  Denies any headaches.    Assessment/Plan:  Breakthrough seizures Neurology is following and managing.  On continuous EEG. Noted to be on Klonopin, lacosamide, Keppra, phenobarbital.  Also noted to be on gabapentin. MRI brain abnormality thought to be artifactual per neurology.  Essential hypertension Continue metoprolol.  Monitor blood pressures closely.  Hydralazine as needed.  Mild AKI Resolved.  Monitor urine output.  Avoid nephrotoxic agents.  Hypokalemia Will be supplemented.  Of note his potassium level was noted to be elevated yesterday but it might have been a hemolyzed sample.  History of stroke with residual right upper extremity weakness Noted to be on statin.  Not noted to be on any antiplatelet agent.  Based on previous notes he is supposed to be on aspirin 81 mg daily.  Will initiate.  Hyperlipidemia Continue statin.  History of COPD Stable respiratory status.  Chronic diastolic CHF Stable.  Alcohol disorder Continue CIWA protocol.  No evidence for  withdrawal currently. According to H&P his last alcohol consumption was 3 days prior to admission.  He drinks half pint of vodka on a daily basis.   DVT Prophylaxis: Lovenox Code Status: Full code Family Communication: Discussed with patient Disposition Plan: Hopefully return home when improved  Status is: Observation The patient will require care spanning > 2 midnights and should be moved to inpatient because: Needs continuous EEG      Medications: Scheduled:  atorvastatin  40 mg Oral QPM   clonazePAM  1 mg Oral BID   enoxaparin (LOVENOX) injection  40 mg Subcutaneous Q24H   folic acid  1 mg Oral Daily   gabapentin  300 mg Oral TID   lacosamide  100 mg Oral BID   levETIRAcetam  2,000 mg Oral BID   metoprolol tartrate  12.5 mg Oral BID   multivitamin with minerals  1 tablet Oral Daily   pantoprazole  40 mg Oral Daily   PHENobarbital  64.8 mg Oral BID   sodium chloride flush  3 mL Intravenous Once   thiamine  100 mg Oral Daily   Or   thiamine  100 mg Intravenous Daily   Continuous:  lacosamide (VIMPAT) IV     levETIRAcetam     HQI:ONGEXBMWUXLKG **OR** acetaminophen, hydrALAZINE, LORazepam **OR** LORazepam, PHENObarbital  Antibiotics: Anti-infectives (From admission, onward)    None       Objective:  Vital Signs  Vitals:   06/10/23 0830 06/10/23 0900 06/10/23 0930 06/10/23 0945  BP: (!) 159/106 (!) 151/97 (!) 152/95   Pulse: 74 82 70   Resp: 19 (!) 21 19   Temp:    98.1 F (36.7 C)  TempSrc:    Oral  SpO2: 100% 99%  100%   Weight:      Height:        Intake/Output Summary (Last 24 hours) at 06/10/2023 0948 Last data filed at 06/10/2023 0013 Gross per 24 hour  Intake 275 ml  Output --  Net 275 ml   Filed Weights   06/09/23 1535  Weight: 76.8 kg    General appearance: Awake alert.  In no distress.  Distracted Resp: Clear to auscultation bilaterally.  Normal effort Cardio: S1-S2 is normal regular.  No S3-S4.  No rubs murmurs or bruit GI:  Abdomen is soft.  Nontender nondistended.  Bowel sounds are present normal.  No masses organomegaly Extremities: No edema.   Neurologic: Oriented to place year month.  No obvious focal neurological deficits.  He does have right upper extremity weakness from previous stroke.   Lab Results:  Data Reviewed: I have personally reviewed following labs and reports of the imaging studies  CBC: Recent Labs  Lab 06/09/23 1530 06/09/23 1552  WBC 6.8  --   NEUTROABS 4.3  --   HGB 13.8 16.0  HCT 42.9 47.0  MCV 86.1  --   PLT 205  --     Basic Metabolic Panel: Recent Labs  Lab 06/09/23 1530 06/09/23 1552 06/09/23 2124 06/10/23 0345  NA 137 138  --  137  K 5.2* 5.4*  --  3.4*  CL 103 103  --  104  CO2 24  --   --  22  GLUCOSE 89 87  --  85  BUN 12 15  --  9  CREATININE 1.32* 1.10  --  0.94  CALCIUM 8.7*  --   --  8.6*  MG  --   --  1.8  --     GFR: Estimated Creatinine Clearance: 85.1 mL/min (by C-G formula based on SCr of 0.94 mg/dL).  Liver Function Tests: Recent Labs  Lab 06/09/23 1530  AST 33  ALT 22  ALKPHOS 152*  BILITOT 1.0  PROT 6.8  ALBUMIN 3.7    Coagulation Profile: Recent Labs  Lab 06/09/23 1530  INR 1.0    Cardiac Enzymes: Recent Labs  Lab 06/09/23 2124  CKTOTAL 187      Radiology Studies: Overnight EEG with video  Result Date: 06/10/2023 Charlsie Quest, MD     06/10/2023  9:41 AM Patient Name: Seth [name] MRN: [id number] Epilepsy Attending: Charlsie Quest Referring Physician/Provider: Gordy Councilman, MD Duration: 06/10/2023 0057 to 06/10/2023 0940 Patient history: 67 y.o. male with history of stroke with residual right-sided weakness, COPD, depression, gout, hypertension and seizures who presents with worsening right-sided weakness. EEG to evaluate for seizure Level of alertness: Awake, asleep AEDs during EEG study: LEV, LCM Technical aspects: This EEG study was done with scalp electrodes positioned according to the 10-20  International system of electrode placement. Electrical activity was reviewed with band pass filter of 1-70Hz , sensitivity of 7 uV/mm, display speed of 84mm/sec with a 60Hz  notched filter applied as appropriate. EEG data were recorded continuously and digitally stored.  Video monitoring was available and reviewed as appropriate. Description:  The posterior dominant rhythm consists of 8-9 Hz activity of moderate voltage (25-35 uV) seen predominantly in posterior head regions, symmetric and reactive to eye opening and eye closing. Sleep was characterized by sleep spindles (12 to 14 Hz), maximal frontocentral region. EEG showed continuous generalized 3 to 5 Hz theta- delta slowing.  Hyperventilation and photic stimulation were not performed.   ABNORMALITY - Continuous slow, generalized IMPRESSION: This  study is suggestive of moderate diffuse encephalopathy. No seizures or epileptiform discharges were seen throughout the recording. Please note that lack of epileptiform activity during interictal EEG does not exclude the diagnosis of epilepsy. Charlsie Quest   MR BRAIN WO CONTRAST  Result Date: 06/09/2023 CLINICAL DATA:  Follow-up examination for stroke. EXAM: MRI HEAD WITHOUT CONTRAST TECHNIQUE: Multiplanar, multiecho pulse sequences of the brain and surrounding structures were obtained without intravenous contrast. COMPARISON:  Comparison made with prior CTs from earlier the same day as well as recent MRI from 12/20/2022. FINDINGS: Brain: Mildly advanced cerebral atrophy for age. Patchy T2/FLAIR hyperintensity involving the periventricular white matter, consistent with chronic small vessel ischemic disease. Chronic ischemic changes within the left cerebral hemisphere with asymmetric atrophy of the left temporal lobe and left hippocampal formation, stable. Associated ventricular prominence with ex vacuo dilatation of the left lateral ventricle without overt hydrocephalus, stable. Multiple small chronic bilateral  cerebellar infarcts noted. Superficial siderosis overlying the left cerebral convexity, consistent with prior hemorrhage, stable. Subtle 12 blood focus of mild diffusion signal abnormality seen involving the left posterior splenium (series 5, image 80). No discernible ADC correlate. Finding likely reflects a small evolving subacute small vessel infarct. No other evidence for acute or subacute ischemia. No acute intracranial hemorrhage. 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 No other mass lesion, mass effect or midline shift. No extra-axial fluid collection. Pituitary gland and suprasellar region within normal limits. Vascular: Major intracranial vascular flow voids are maintained. Skull and upper cervical spine: Craniocervical junction within normal limits. Postoperative changes partially visualize within the cervical spine. Bone marrow signal intensity normal. No scalp soft tissue abnormality. Sinuses/Orbits: Globes orbital soft tissues within normal limits. Mild scattered mucosal thickening present about the frontoethmoidal and maxillary sinuses. No mastoid effusion. Other: Anterior IMPRESSION: 1. 12 mm focus of mild diffusion signal abnormality involving the left posterior splenium, likely reflecting a small evolving subacute small vessel infarct. No associated hemorrhage or mass effect. 2. No other acute intracranial abnormality. 3. Otherwise stable appearance of the brain with multiple chronic findings as detailed above. Electronically Signed   By: Rise Mu M.D.   On: 06/09/2023 19:44   CT ANGIO HEAD NECK W WO CM (CODE STROKE)  Result Date: 06/09/2023 CLINICAL DATA:  Code stroke EXAM: CT ANGIOGRAPHY HEAD AND NECK WITH AND WITHOUT CONTRAST TECHNIQUE: Multidetector CT imaging of the head and neck was performed using the standard protocol during bolus administration of intravenous contrast. Multiplanar CT image reconstructions and MIPs were obtained to evaluate the  vascular anatomy. Carotid stenosis measurements (when applicable) are obtained utilizing NASCET criteria, using the distal internal carotid diameter as the denominator. RADIATION DOSE REDUCTION: This exam was performed according to the departmental dose-optimization program which includes automated exposure control, adjustment of the mA and/or kV according to patient size and/or use of iterative reconstruction technique. CONTRAST:  75mL OMNIPAQUE IOHEXOL 350 MG/ML SOLN COMPARISON:  CT head without contrast 06/09/2023. CT angio head and neck 12/20/2022. FINDINGS: CTA NECK FINDINGS Aortic arch: Atherosclerotic calcifications are present at the aortic arch. Great vessel origins demonstrate no significant atherosclerotic disease. Common origin of the left common carotid artery and innominate artery is noted. Right carotid system: Right common carotid artery is within normal limits. Atherosclerotic calcifications are present at the bifurcation and proximal right ICA without significant stenosis. 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 No change is present. Left carotid system: The left common carotid artery demonstrates some mural calcification  without significant stenosis. Scattered calcifications are present at the bifurcation and proximal left ICA without significant stenosis. A prominent medial pseudoaneurysm is thrombosed. Minimal narrowing of the negative cervical ICA is present at this level. Vertebral arteries: The vertebral arteries are hypoplastic bilaterally. Both vertebral arteries originate from the subclavian arteries. Focal calcification is present at the origin of the right vertebral artery, stable. No other significant stenosis is present in either vertebral artery in the neck. Skeleton: Stable anterior fusion is present at C3-4. Vertebral body heights and alignment are normal. No focal osseous lesions are present. The patient is edentulous. Other neck: Soft tissues the neck are  otherwise unremarkable. Salivary glands are within normal limits. Thyroid is normal. No significant adenopathy is present. No focal mucosal or submucosal lesions are present. Upper chest: Centrilobular emphysematous changes are present both lung apices. No nodule or mass lesion is present. No focal airspace disease is present. Review of the MIP images confirms the above findings CTA HEAD FINDINGS Anterior circulation: Atherosclerotic calcifications are present within the cavernous internal carotid arteries bilaterally. No significant stenosis is present relative to the ICA termini. The A1 and M1 segments are normal. Anterior communicating artery is patent. The MCA bifurcations are within normal limits bilaterally. The ACA and MCA branch vessels are normal bilaterally. Posterior circulation: The PICA origins are visualized and normal. The vertebrobasilar junction and basilar artery are within normal limits. Basilar artery is small. It is centrally terminates at the superior cerebellar arteries. A very small right P1 segment is present. Fetal type posterior cerebral arteries are present bilaterally. The PCA branch vessels are normal bilaterally. Venous sinuses: The dural sinuses are patent. The straight sinus and deep cerebral veins are intact. Cortical veins are within normal limits. No significant vascular malformation is evident. Anatomic variants: Fetal type posterior cerebral arteries bilaterally. Review of the MIP images confirms the above findings IMPRESSION: 1. No emergent large vessel occlusion. 2. 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 3. Stable atherosclerotic changes at the carotid bifurcations and cavernous internal carotid arteries bilaterally without significant stenosis. 4. Hypoplastic vertebral arteries bilaterally. 5. Fetal type posterior cerebral arteries bilaterally. 6.  Emphysema (ICD10-J43.9). Electronically Signed   By: Marin Roberts M.D.   On:  06/09/2023 16:14   CT HEAD CODE STROKE WO CONTRAST  Result Date: 06/09/2023 CLINICAL DATA:  Code stroke. Neuro deficit, acute, stroke suspected. EXAM: CT HEAD WITHOUT CONTRAST TECHNIQUE: Contiguous axial images were obtained from the base of the skull through the vertex without intravenous contrast. RADIATION DOSE REDUCTION: This exam was performed according to the departmental dose-optimization program which includes automated exposure control, adjustment of the mA and/or kV according to patient size and/or use of iterative reconstruction technique. COMPARISON:  Head CT 02/13/2023. FINDINGS: Brain: No acute hemorrhage. Unchanged background of moderate chronic small-vessel disease with old lacunar infarcts in the bilateral cerebellar hemispheres and old infarct in the posterior aspect of the mesial left temporal lobe. No new loss of gray-white differentiation. No acute hydrocephalus or extra-axial collection. 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 No mass effect or midline shift. Vascular: No hyperdense vessel or unexpected calcification. Skull: No calvarial fracture or suspicious bone lesion. Skull base is unremarkable. Sinuses/Orbits: Mild anterior ethmoid and inferior frontal sinus disease. Orbits are unremarkable. Other: None. ASPECTS [facility] Stroke Program Early CT Score) - Ganglionic level infarction (caudate, lentiform nuclei, internal capsule, insula, M1-M3 cortex): 7 - Supraganglionic infarction (M4-M6 cortex): 3 Total score (0-10 with 10 being normal): 10 IMPRESSION: 1. No  acute intracranial hemorrhage or evidence of acute large vessel territory infarct. ASPECT score is 10. 2. Unchanged background of moderate chronic small-vessel disease with old lacunar infarcts in the bilateral cerebellar hemispheres and old infarct in the posterior aspect of the mesial left temporal lobe. 3. 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 Code stroke imaging results were communicated on  06/09/2023 at 3:48 pm to provider Dr. Derry Lory via secure text paging. Electronically Signed   By: Orvan Falconer M.D.   On: 06/09/2023 15:48       LOS: 0 days   Seth [name]  [facility] Pager on [email or web address]  06/10/2023, 9:48 AM

## 2023-06-10 NOTE — Procedures (Addendum)
Patient Name: Seth [name]  MRN: [id number]  Epilepsy Attending: Charlsie Quest  Referring Physician/Provider: Gordy Councilman, MD  Duration: 06/10/2023 0057 to 06/10/2023 1329  Patient history: 67 y.o. male with history of stroke with residual right-sided weakness, COPD, depression, gout, hypertension and seizures who presents with worsening right-sided weakness. EEG to evaluate for seizure  Level of alertness: Awake, asleep  AEDs during EEG study: LEV, LCM  Technical aspects: This EEG study was done with scalp electrodes positioned according to the 10-20 International system of electrode placement. Electrical activity was reviewed with band pass filter of 1-70Hz , sensitivity of 7 uV/mm, display speed of 51mm/sec with a 60Hz  notched filter applied as appropriate. EEG data were recorded continuously and digitally stored.  Video monitoring was available and reviewed as appropriate.  Description:  The posterior dominant rhythm consists of 8-9 Hz activity of moderate voltage (25-35 uV) seen predominantly in posterior head regions, symmetric and reactive to eye opening and eye closing. Sleep was characterized by sleep spindles (12 to 14 Hz), maximal frontocentral region. EEG showed continuous generalized 3 to 5 Hz theta- delta slowing.  Hyperventilation and photic stimulation were not performed.     ABNORMALITY - Continuous slow, generalized  IMPRESSION: This study is suggestive of moderate diffuse encephalopathy. No seizures or epileptiform discharges were seen throughout the recording.  Please note that lack of epileptiform activity during interictal EEG does not exclude the diagnosis of epilepsy.   [name] [name]
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
## 2023-06-11 DIAGNOSIS — R569 Unspecified convulsions: Secondary | ICD-10-CM | POA: Diagnosis not present

## 2023-06-11 LAB — BASIC METABOLIC PANEL
Anion gap: 9 (ref 5–15)
BUN: 11 mg/dL (ref 8–23)
CO2: 24 mmol/L (ref 22–32)
Calcium: 9 mg/dL (ref 8.9–10.3)
Chloride: 105 mmol/L (ref 98–111)
Creatinine, Ser: 1 mg/dL (ref 0.61–1.24)
GFR, Estimated: >60 mL/min (ref >60)
Glucose, Bld: 79 mg/dL (ref 70–99)
Potassium: 4 mmol/L (ref 3.5–5.1)
Sodium: 138 mmol/L (ref 135–145)

## 2023-06-11 LAB — LEVETIRACETAM LEVEL: Levetiracetam Lvl: 3.1 ug/mL — ABNORMAL LOW (ref 10.0–40.0)

## 2023-06-11 LAB — MAGNESIUM: Magnesium: 2.1 mg/dL (ref 1.7–2.4)

## 2023-06-11 LAB — CBC
HCT: 47.5 % (ref 39.0–52.0)
Hemoglobin: 15.4 g/dL (ref 13.0–17.0)
MCH: 28.3 pg (ref 26.0–34.0)
MCHC: 32.4 g/dL (ref 30.0–36.0)
MCV: 87.2 fL (ref 80.0–100.0)
Platelets: 168 10*3/uL (ref 150–400)
RBC: 5.45 MIL/uL (ref 4.22–5.81)
RDW: 18.4 % — ABNORMAL HIGH (ref 11.5–15.5)
WBC: 5.5 10*3/uL (ref 4.0–10.5)
nRBC: 0 % (ref 0.0–0.2)

## 2023-06-11 LAB — GLUCOSE, CAPILLARY: Glucose-Capillary: 79 mg/dL (ref 70–99)

## 2023-06-11 MED ORDER — AMLODIPINE BESYLATE 10 MG PO TABS
10.0000 mg | ORAL_TABLET | Freq: Every day | ORAL | Status: DC
  Administered 2023-06-11 – 2023-06-12 (×2): 10 mg via ORAL
  Filled 2023-06-11 (×2): qty 1

## 2023-06-11 NOTE — Progress Notes (Signed)
BP > 180. Hydralazine given as ordered prn.

## 2023-06-11 NOTE — Progress Notes (Signed)
Mobility Specialist Progress Note;   06/11/23 1455  Mobility  Activity Ambulated with assistance in hallway  Level of Assistance Contact guard assist, steadying assist  Assistive Device Front wheel [place]  Distance Ambulated (ft) 250 ft  Activity Response Tolerated well  Mobility Referral Yes  $Mobility charge 1 Mobility  Mobility Specialist Start Time (ACUTE ONLY) 1455  Mobility Specialist Stop Time (ACUTE ONLY) 1515  Mobility Specialist Time Calculation (min) (ACUTE ONLY) 20 min   Pt eager for mobility. Required minG assistance during ambulation. Asx throughout session. Pt back in bed with all needs met. Alarm on.  Caesar Bookman Mobility Specialist Please contact via SecureChat or Rehab Office [phone]

## 2023-06-11 NOTE — Progress Notes (Signed)
06/11/23 1100  Spiritual Encounters  Type of Visit Initial  Care provided to: Patient  Referral source Patient request  Reason for visit Advance directives  OnCall Visit No  Spiritual Framework  Presenting Themes Meaning/purpose/sources of inspiration;Significant life change;Community and relationships  Community/Connection Family  Patient Stress Factors Health changes  Family Stress Factors Health changes  Interventions  Spiritual Care Interventions Made Established relationship of care and support;Compassionate presence;Reflective listening;Normalization of emotions  Intervention Outcomes  Outcomes Connection to spiritual care;Awareness around self/spiritual resourses;Awareness of support  Spiritual Care Plan  Spiritual Care Issues Still Outstanding No further spiritual care needs at this time (see row info)    Chaplain responded to a consult request for Advance Directive education. Patient shared his strong relationship with his children and grand kids. He will discuss the AD document this afternoon with his kids.   Chaplain provided the Advance Directive packet as well as education on Advance Directives-documents an individual completes to communicate their health care directions in advance of a time when they may need them. Chaplain informed pt the documents which may be completed here in the hospital are the Living Will and Health Care Power of [place].  Chaplain informed that the Health Care Power of [place] is a legal document in which an individual names another person, their Health Care Agent, to make health care decisions when the individual is not able to make them for themselves. The Health Care Agent's function can be temporary or permanent depending on the pt's ability to make and communicate those decisions independently. Chaplain informed pt in the absence of a Health Care Power of [place], the state of [place] directs health care providers to look to the  following individuals in the order listed: legal guardian; an attorney?in?fact under a general power of attorney (POA) if that POA includes the right to make health care decisions; a husband or wife; a majority of parents and adult children; a majority of adult brothers and sisters; or an individual who has an established relationship with you, who is acting in good faith and who can convey your wishes.  If none of these person are available or willing to make medical decisions on a patient's behalf, the law allows the patient's doctor to make decisions for them as long as another doctor agrees with those decisions.  Chaplain also informed the patient that the Health Care agent has no decision-making authority over any affairs other than those related to his or her medical care.  The chaplain further educated the pt that a Living Will is a legal document that allows an individual to state his or her desire not to receive life-prolonging measures in the event that they have a condition that is incurable and will result in their death in a short period of time; they are unconscious, and doctors are confident that they will not regain consciousness; and/or they have advanced dementia or other substantial and irreversible loss of mental function. The chaplain informed pt that life-prolonging measures are medical treatments that would only serve to postpone death, including breathing machines, kidney dialysis, antibiotics, artificial nutrition and hydration (tube feeding), and similar forms of treatment and that if an individual is able to express their wishes, they may also make them known without the use of a Living Will, but in the event that an individual is not able to express their wishes themselves, a Living Will allows medical providers and the pt's family and friends ensure that they are not making decisions on  the pt's behalf, but rather serving as the pt's voice to convey decisions the pt has already  made.  The patient is aware that the decision to create an advance directive is theirs alone and they may chose not to complete the documents or may chose to complete one portion or both.  The patient was informed that they can revoke the documents at any time by striking through them and writing void or by completing new documents, but that it is also advisable that the individual verbally notify interested parties that their wishes have changed.  They are also aware that the document must be signed in the presence of a notary public and two witnesses and that this can be done while the patient is still admitted to the hospital or after discharge in the community. If they decide to complete Advance Directives after being discharged from the hospital, they have been advised to notify all interested parties and to provide those documents to their physicians and loved ones in addition to bringing them to the hospital in the event of another hospitalization.  The chaplain informed the pt that if they desire to proceed with completing Advance Directive Documentation while they are still admitted, notary services are typically available at [facility] between the hours of 1:00 and 3:30 Monday-Thursday.    When the patient is ready to have these documents completed, the patient should request that their nurse place a spiritual care consult and indicate that the patient is ready to have their advance directives notarized so that arrangements for witnesses and notary public can be made.  Please page spiritual care if the patient desires further education or has questions.   M.Kubra Valerian Jewel, MA Chaplain Intern [phone]

## 2023-06-11 NOTE — Plan of Care (Signed)

## 2023-06-11 NOTE — Progress Notes (Signed)
PROGRESS NOTE                                                                                                                                                                                                             Patient Demographics:    Seth [name], is a 67 y.o. male, DOB - Sep 27, 1955, ONG:[id number]  Outpatient Primary MD for the patient is Sharmon Revere, MD    LOS - 1  Admit date - 06/09/2023    Chief Complaint  Patient presents with   Code Stroke       Brief Narrative (HPI from H&P)   66 y.o. male with medical history significant of hypertension, stroke, COPD, chronic HFpEF, AAA, lung nodules, brain meningioma, subdural hemorrhage in May 2022, prior CVA with right-sided weakness, PAD, depression, BPH, gout, alcohol use disorder with history of alcohol withdrawal seizures, marijuana abuse, tobacco abuse presented to ED complaining of worsening right-sided weakness since 11 AM today.  He was noted to have mild dysarthria, aphasia, and right hemianopsia.  Seen by neurology who felt his symptoms were secondary to seizures.  He was hospitalized for further management.     Subjective:    Seth [name] today has, No headache, No chest pain, No abdominal pain - No Nausea, No new weakness tingling or numbness, no SOB   Assessment  & Plan :   Breakthrough seizures Seen by neurology, breakthrough seizure due to AED noncompliance, counseled, continue below mentioned regimen, advance activity, PT-OT evaluation, if stable discharge on 06/12/2023.  - continue home Keppra 2000mg  BID - cont home phenobarb 65mg  BID - cont home Vimpat 100mg  BID - cont home Gabapentin 300mg  TID - cont home Clonazepam 1mg  BID  Essential hypertension Continue metoprolol, blood pressure still elevated will add Norvasc for better control.   Mild AKI Resolved after hydration.   Hypokalemia Replaced.  Monitor.Marland Kitchen   History of stroke with  residual right upper extremity weakness, MRI and CTA head and neck noted, MRI finding likely artifact per neurology Currently on statin, aspirin initiated for secondary prevention, commence PT OT and monitor   Hyperlipidemia Continue statin.   History of COPD Stable respiratory status.   Chronic diastolic CHF of 70% on last echocardiogram in 2022. Stable and compensated.   Alcohol disorder Continue CIWA protocol.  No evidence for withdrawal currently. According to H&P his last  alcohol consumption was 3 days prior to admission.  He drinks half pint of vodka on a daily basis, counseled to quit       Condition - Fair  Family Communication  :  daughter Pryor Montes [phone] 06/11/23  Code Status :  Full  Consults  :  Neuro  PUD Prophylaxis :    Procedures  :     EEG - This study is suggestive of moderate diffuse encephalopathy. No seizures or epileptiform discharges were seen throughout the recording. Please note that lack of epileptiform activity during interictal EEG does not exclude the diagnosis of epilepsy.  CTA - 1. No emergent large vessel occlusion. 2. 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 3. Stable atherosclerotic changes at the carotid bifurcations and cavernous internal carotid arteries bilaterally without significant stenosis. 4. Hypoplastic vertebral arteries bilaterally. 5. Fetal type posterior cerebral arteries bilaterally. 6.  Emphysema  MRI - 1. 12 mm focus of mild diffusion signal abnormality involving the left posterior splenium, likely reflecting a small evolving subacute small vessel infarct. No associated hemorrhage or mass effect. 2. No other acute intracranial abnormality. 3. Otherwise stable appearance of the brain with multiple chronic findings as detailed above.      Disposition Plan  :    Status is: Inpatient   DVT Prophylaxis  :    enoxaparin (LOVENOX) injection 40 mg Start: 06/10/23 1000    Lab  Results  Component Value Date   PLT 168 06/11/2023    Diet :  Diet Order             Diet heart healthy/carb modified Room service appropriate? Yes; Fluid consistency: Thin  Diet effective now                    Inpatient Medications  Scheduled Meds:  aspirin EC  81 mg Oral Daily   atorvastatin  40 mg Oral QPM   clonazePAM  1 mg Oral BID   enoxaparin (LOVENOX) injection  40 mg Subcutaneous Q24H   folic acid  1 mg Oral Daily   gabapentin  300 mg Oral TID   influenza vaccine adjuvanted  0.5 mL Intramuscular Tomorrow-1000   lacosamide  100 mg Oral BID   levETIRAcetam  2,000 mg Oral BID   metoprolol tartrate  12.5 mg Oral BID   multivitamin with minerals  1 tablet Oral Daily   nicotine  14 mg Transdermal Daily   pantoprazole  40 mg Oral Daily   PHENobarbital  64.8 mg Oral BID   pneumococcal 20-valent conjugate vaccine  0.5 mL Intramuscular Tomorrow-1000   sodium chloride flush  3 mL Intravenous Once   thiamine  100 mg Oral Daily   Or   thiamine  100 mg Intravenous Daily   Continuous Infusions:  lacosamide (VIMPAT) IV     levETIRAcetam     PRN Meds:.acetaminophen **OR** acetaminophen, hydrALAZINE, LORazepam **OR** LORazepam, PHENObarbital  Antibiotics  :    Anti-infectives (From admission, onward)    None         Objective:   Vitals:   06/11/23 0340 06/11/23 0400 06/11/23 0433 06/11/23 0525  BP: (!) 172/90 (!) 182/101 (!) 178/96 (!) 152/87  Pulse: 64 77 63 73  Resp: 15 (!) 21 (!) 22 16  Temp: (!) 97.5 F (36.4 C)     TempSrc: Oral     SpO2: 98% 98% 99% 100%  Weight:      Height:        Wt Readings  from Last 3 Encounters:  06/09/23 76.8 kg  04/21/23 82.6 kg  03/30/23 78.9 kg     Intake/Output Summary (Last 24 hours) at 06/11/2023 0823 Last data filed at 06/10/2023 1200 Gross per 24 hour  Intake 240 ml  Output --  Net 240 ml     Physical Exam  Awake Alert, No new F.N deficits, Normal affect [place].AT,PERRAL Supple Neck, No JVD,    Symmetrical Chest wall movement, Good air movement bilaterally, CTAB RRR,No Gallops,Rubs or new Murmurs,  +ve B.Sounds, Abd Soft, No tenderness,   L. Foot TMT       Data Review:    Recent Labs  Lab 06/09/23 1530 06/09/23 1552 06/11/23 0734  WBC 6.8  --  PENDING  HGB 13.8 16.0 15.4  HCT 42.9 47.0 47.5  PLT 205  --  168  MCV 86.1  --  87.2  MCH 27.7  --  28.3  MCHC 32.2  --  32.4  RDW 18.0*  --  18.4*  LYMPHSABS 1.8  --   --   MONOABS 0.3  --   --   EOSABS 0.3  --   --   BASOSABS 0.1  --   --     Recent Labs  Lab 06/09/23 1530 06/09/23 1552 06/09/23 2124 06/10/23 0345 06/11/23 0734  NA 137 138  --  137 138  K 5.2* 5.4*  --  3.4* 4.0  CL 103 103  --  104 105  CO2 24  --   --  22 24  ANIONGAP 10  --   --  11 9  GLUCOSE 89 87  --  85 79  BUN 12 15  --  9 11  CREATININE 1.32* 1.10  --  0.94 1.00  AST 33  --   --   --   --   ALT 22  --   --   --   --   ALKPHOS 152*  --   --   --   --   BILITOT 1.0  --   --   --   --   ALBUMIN 3.7  --   --   --   --   INR 1.0  --   --   --   --   MG  --   --  1.8  --  2.1  CALCIUM 8.7*  --   --  8.6* 9.0      Recent Labs  Lab 06/09/23 1530 06/09/23 2124 06/10/23 0345 06/11/23 0734  INR 1.0  --   --   --   MG  --  1.8  --  2.1  CALCIUM 8.7*  --  8.6* 9.0    --------------------------------------------------------------------------------------------------------------- Lab Results  Component Value Date   CHOL 115 10/13/2021   HDL 42 10/13/2021   LDLCALC 63 10/13/2021   TRIG 50 10/13/2021   CHOLHDL 2.7 10/13/2021    Lab Results  Component Value Date   HGBA1C 5.0 05/18/2021   No results for input(s): "TSH", "T4TOTAL", "FREET4", "T3FREE", "THYROIDAB" in the last 72 hours. No results for input(s): "VITAMINB12", "FOLATE", "FERRITIN", "TIBC", "IRON", "RETICCTPCT" in the last 72 hours. ------------------------------------------------------------------------------------------------------------------ Cardiac Enzymes No  results for input(s): "CKMB", "TROPONINI", "MYOGLOBIN" in the last 168 hours.  Invalid input(s): "CK"  Micro Results No results found for this or any previous visit (from the past 240 hour(s)).  Radiology Reports Overnight EEG with video  Result Date: 06/10/2023 Seth Quest, MD     06/10/2023  5:56  PM Patient Name: Seth [name] MRN: [id number] Epilepsy Attending: Charlsie [name] Referring Physician/Provider: Gordy Councilman, MD Duration: 06/10/2023 0057 to 06/10/2023 1329 Patient history: 67 y.o. male with history of stroke with residual right-sided weakness, COPD, depression, gout, hypertension and seizures who presents with worsening right-sided weakness. EEG to evaluate for seizure Level of alertness: Awake, asleep AEDs during EEG study: LEV, LCM Technical aspects: This EEG study was done with scalp electrodes positioned according to the 10-20 International system of electrode placement. Electrical activity was reviewed with band pass filter of 1-70Hz , sensitivity of 7 uV/mm, display speed of 43mm/sec with a 60Hz  notched filter applied as appropriate. EEG data were recorded continuously and digitally stored.  Video monitoring was available and reviewed as appropriate. Description:  The posterior dominant rhythm consists of 8-9 Hz activity of moderate voltage (25-35 uV) seen predominantly in posterior head regions, symmetric and reactive to eye opening and eye closing. Sleep was characterized by sleep spindles (12 to 14 Hz), maximal frontocentral region. EEG showed continuous generalized 3 to 5 Hz theta- delta slowing.  Hyperventilation and photic stimulation were not performed.   ABNORMALITY - Continuous slow, generalized IMPRESSION: This study is suggestive of moderate diffuse encephalopathy. No seizures or epileptiform discharges were seen throughout the recording. Please note that lack of epileptiform activity during interictal EEG does not exclude the diagnosis of epilepsy. Seth [name]   MR BRAIN WO CONTRAST  Result Date: 06/09/2023 CLINICAL DATA:  Follow-up examination for stroke. EXAM: MRI HEAD WITHOUT CONTRAST TECHNIQUE: Multiplanar, multiecho pulse sequences of the brain and surrounding structures were obtained without intravenous contrast. COMPARISON:  Comparison made with prior CTs from earlier the same day as well as recent MRI from 12/20/2022. FINDINGS: Brain: Mildly advanced cerebral atrophy for age. Patchy T2/FLAIR hyperintensity involving the periventricular white matter, consistent with chronic small vessel ischemic disease. Chronic ischemic changes within the left cerebral hemisphere with asymmetric atrophy of the left temporal lobe and left hippocampal formation, stable. Associated ventricular prominence with ex vacuo dilatation of the left lateral ventricle without overt hydrocephalus, stable. Multiple small chronic bilateral cerebellar infarcts noted. Superficial siderosis overlying the left cerebral convexity, consistent with prior hemorrhage, stable. Subtle 12 blood focus of mild diffusion signal abnormality seen involving the left posterior splenium (series 5, image 80). No discernible ADC correlate. Finding likely reflects a small evolving subacute small vessel infarct. No other evidence for acute or subacute ischemia. No acute intracranial hemorrhage. 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 No other mass lesion, mass effect or midline shift. No extra-axial fluid collection. Pituitary gland and suprasellar region within normal limits. Vascular: Major intracranial vascular flow voids are maintained. Skull and upper cervical spine: Craniocervical junction within normal limits. Postoperative changes partially visualize within the cervical spine. Bone marrow signal intensity normal. No scalp soft tissue abnormality. Sinuses/Orbits: Globes orbital soft tissues within normal limits. Mild scattered mucosal thickening present about the  frontoethmoidal and maxillary sinuses. No mastoid effusion. Other: Anterior IMPRESSION: 1. 12 mm focus of mild diffusion signal abnormality involving the left posterior splenium, likely reflecting a small evolving subacute small vessel infarct. No associated hemorrhage or mass effect. 2. No other acute intracranial abnormality. 3. Otherwise stable appearance of the brain with multiple chronic findings as detailed above. Electronically Signed   By: Rise Mu M.D.   On: 06/09/2023 19:44   CT ANGIO HEAD NECK W WO CM (CODE STROKE)  Result Date: 06/09/2023 CLINICAL DATA:  Code stroke EXAM: CT ANGIOGRAPHY  HEAD AND NECK WITH AND WITHOUT CONTRAST TECHNIQUE: Multidetector CT imaging of the head and neck was performed using the standard protocol during bolus administration of intravenous contrast. Multiplanar CT image reconstructions and MIPs were obtained to evaluate the vascular anatomy. Carotid stenosis measurements (when applicable) are obtained utilizing NASCET criteria, using the distal internal carotid diameter as the denominator. RADIATION DOSE REDUCTION: This exam was performed according to the departmental dose-optimization program which includes automated exposure control, adjustment of the mA and/or kV according to patient size and/or use of iterative reconstruction technique. CONTRAST:  75mL OMNIPAQUE IOHEXOL 350 MG/ML SOLN COMPARISON:  CT head without contrast 06/09/2023. CT angio head and neck 12/20/2022. FINDINGS: CTA NECK FINDINGS Aortic arch: Atherosclerotic calcifications are present at the aortic arch. Great vessel origins demonstrate no significant atherosclerotic disease. Common origin of the left common carotid artery and innominate artery is noted. Right carotid system: Right common carotid artery is within normal limits. Atherosclerotic calcifications are present at the bifurcation and proximal right ICA without significant stenosis. A pseudoaneurysm of the mid cervical right ICA is  present. No change is present. Left carotid system: The left common carotid artery demonstrates some mural calcification without significant stenosis. Scattered calcifications are present at the bifurcation and proximal left ICA without significant stenosis. A prominent medial pseudoaneurysm is thrombosed. Minimal narrowing of the negative cervical ICA is present at this level. Vertebral arteries: The vertebral arteries are hypoplastic bilaterally. Both vertebral arteries originate from the subclavian arteries. Focal calcification is present at the origin of the right vertebral artery, stable. No other significant stenosis is present in either vertebral artery in the neck. Skeleton: Stable anterior fusion is present at C3-4. Vertebral body heights and alignment are normal. No focal osseous lesions are present. The patient is edentulous. Other neck: Soft tissues the neck are otherwise unremarkable. Salivary glands are within normal limits. Thyroid is normal. No significant adenopathy is present. No focal mucosal or submucosal lesions are present. Upper chest: Centrilobular emphysematous changes are present both lung apices. No nodule or mass lesion is present. No focal airspace disease is present. Review of the MIP images confirms the above findings CTA HEAD FINDINGS Anterior circulation: Atherosclerotic calcifications are present within the cavernous internal carotid arteries bilaterally. No significant stenosis is present relative to the ICA termini. The A1 and M1 segments are normal. Anterior communicating artery is patent. The MCA bifurcations are within normal limits bilaterally. The ACA and MCA branch vessels are normal bilaterally. Posterior circulation: The PICA origins are visualized and normal. The vertebrobasilar junction and basilar artery are within normal limits. Basilar artery is small. It is centrally terminates at the superior cerebellar arteries. A very small right P1 segment is present. Fetal type  posterior cerebral arteries are present bilaterally. The PCA branch vessels are normal bilaterally. Venous sinuses: The dural sinuses are patent. The straight sinus and deep cerebral veins are intact. Cortical veins are within normal limits. No significant vascular malformation is evident. Anatomic variants: Fetal type posterior cerebral arteries bilaterally. Review of the MIP images confirms the above findings IMPRESSION: 1. No emergent large vessel occlusion. 2. 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 3. Stable atherosclerotic changes at the carotid bifurcations and cavernous internal carotid arteries bilaterally without significant stenosis. 4. Hypoplastic vertebral arteries bilaterally. 5. Fetal type posterior cerebral arteries bilaterally. 6.  Emphysema (ICD10-J43.9). Electronically Signed   By: Marin Roberts M.D.   On: 06/09/2023 16:14   CT HEAD CODE STROKE WO CONTRAST  Result Date: 06/09/2023  CLINICAL DATA:  Code stroke. Neuro deficit, acute, stroke suspected. EXAM: CT HEAD WITHOUT CONTRAST TECHNIQUE: Contiguous axial images were obtained from the base of the skull through the vertex without intravenous contrast. RADIATION DOSE REDUCTION: This exam was performed according to the departmental dose-optimization program which includes automated exposure control, adjustment of the mA and/or kV according to patient size and/or use of iterative reconstruction technique. COMPARISON:  Head CT 02/13/2023. FINDINGS: Brain: No acute hemorrhage. Unchanged background of moderate chronic small-vessel disease with old lacunar infarcts in the bilateral cerebellar hemispheres and old infarct in the posterior aspect of the mesial left temporal lobe. No new loss of gray-white differentiation. No acute hydrocephalus or extra-axial collection. 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 No mass effect or midline shift. Vascular: No hyperdense vessel or unexpected  calcification. Skull: No calvarial fracture or suspicious bone lesion. Skull base is unremarkable. Sinuses/Orbits: Mild anterior ethmoid and inferior frontal sinus disease. Orbits are unremarkable. Other: None. ASPECTS [facility] Stroke Program Early CT Score) - Ganglionic level infarction (caudate, lentiform nuclei, internal capsule, insula, M1-M3 cortex): 7 - Supraganglionic infarction (M4-M6 cortex): 3 Total score (0-10 with 10 being normal): 10 IMPRESSION: 1. No acute intracranial hemorrhage or evidence of acute large vessel territory infarct. ASPECT score is 10. 2. Unchanged background of moderate chronic small-vessel disease with old lacunar infarcts in the bilateral cerebellar hemispheres and old infarct in the posterior aspect of the mesial left temporal lobe. 3. 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 Code stroke imaging results were communicated on 06/09/2023 at 3:48 pm to provider Dr. Derry Lory via secure text paging. Electronically Signed   By: Orvan Falconer M.D.   On: 06/09/2023 15:48      Signature  -   Susa Raring M.D on 06/11/2023 at 8:23 AM   -  To page go to [email or web address]

## 2023-06-11 NOTE — Evaluation (Signed)
Physical Therapy Evaluation Patient Details Name: Seth [name] MRN: [id number] DOB: [birth year] Today's Date: 06/11/2023  History of Present Illness  Pt is a 67 y/o M admitted on 06/09/23 after presenting with c/o worsening R sided weakness & noted to have mild dysarthria, aphasia, & R hemianopsia. Neurology felt symptoms were 2/2 seizures. PMH: HTN, stroke, COPD, chronic HFpEF, AAA, lung nodules, brain meningioma, SDH (May 2022), PAD, depression, BPH, gout, alcohol use disorder, L transmet amputation  Clinical Impression  Pt seen for PT evaluation with pt agreeable to tx. Pt reports he lives with his mother & sister, is ambulatory with SPC. Pt notes his R side is half way back to normal today. On this date, pt is able to complete bed mobility with hospital bed features & supervision, STS with supervision, and ambulate with RW & supervision. Pt demonstrates gait pattern as noted below. Will continue to follow pt acutely to address balance, gait with LRAD/return to using SPC, & strengthening.        If plan is discharge home, recommend the following: A little help with walking and/or transfers;A little help with bathing/dressing/bathroom;Assistance with cooking/housework;Assist for transportation;Help with stairs or ramp for entrance   Can travel by private vehicle        Equipment Recommendations None recommended by PT  Recommendations for Other Services       Functional Status Assessment Patient has had a recent decline in their functional status and demonstrates the ability to make significant improvements in function in a reasonable and predictable amount of time.     Precautions / Restrictions Precautions Precautions: Fall Precaution Comments: seizures Restrictions Weight Bearing Restrictions: No      Mobility  Bed Mobility Overal bed mobility: Needs Assistance Bed Mobility: Supine to Sit     Supine to sit: Supervision, HOB elevated, Used rails           Transfers Overall transfer level: Needs assistance Equipment used: Rolling Ricke (2 wheels) Transfers: Sit to/from Stand Sit to Stand: Supervision           General transfer comment: STS from EOB    Ambulation/Gait Ambulation/Gait assistance: Supervision Gait Distance (Feet): 150 Feet Assistive device: Rolling Zech (2 wheels) Gait Pattern/deviations: Decreased step length - right, Decreased stride length, Decreased step length - left, Trunk flexed Gait velocity: decreased     General Gait Details: Education re: need to ambulate in base of AD vs pushing RW out in front. Pt reports it feels good to be up & walking.  Stairs            Wheelchair Mobility     Tilt Bed    Modified Rankin (Stroke Patients Only)       Balance Overall balance assessment: Needs assistance Sitting-balance support: Feet supported Sitting balance-[name] Scale: Good     Standing balance support: During functional activity, Bilateral upper extremity supported, Reliant on assistive device for balance Standing balance-[name] Scale: Fair                               Pertinent Vitals/Pain Pain Assessment Pain Assessment: No/denies pain    Home Living Family/patient expects to be discharged to:: Private residence Living Arrangements: Parent;Other (Comment) (sister is caregiver for pt & pt's mother) Available Help at Discharge: Family Type of Home: House Home Access: Stairs to enter Entrance Stairs-Rails: Right;Can reach both;Left Entrance Stairs-Number of Steps: 3 Alternate Level Stairs-Number of Steps: pt's room is down ~4-5  steps (B rails) to basement Home Layout: Multi-level Home Equipment: Cane - single point;Rolling Fierro (2 wheels);Shower seat;Grab bars - toilet;Grab bars - tub/shower;BSC/3in1;Rollator (4 wheels) (some info from previous chart entry)      Prior Function               Mobility Comments: pt reports he's ambulatory with SPC, no fall in the  past 6 months       Extremity/Trunk Assessment   Upper Extremity Assessment Upper Extremity Assessment: Generalized weakness    Lower Extremity Assessment Lower Extremity Assessment: Generalized weakness    Cervical / Trunk Assessment Cervical / Trunk Assessment: Kyphotic  Communication   Communication Communication: No apparent difficulties  Cognition Arousal: Alert Behavior During Therapy: WFL for tasks assessed/performed Overall Cognitive Status: Within Functional Limits for tasks assessed                                          General Comments General comments (skin integrity, edema, etc.): BP in LUE sitting EOB: 121/107 mmHg MAP 113 but question accuracy as pt moving arm at times.    Exercises     Assessment/Plan    PT Assessment Patient needs continued PT services  PT Problem List Decreased strength;Decreased activity tolerance;Decreased balance;Decreased mobility       PT Treatment Interventions DME instruction;Balance training;Gait training;Neuromuscular re-education;Stair training;Functional mobility training;Therapeutic activities;Therapeutic exercise;Manual techniques;Patient/family education    PT Goals (Current goals can be found in the Care Plan section)  Acute Rehab PT Goals Patient Stated Goal: get better, go home PT Goal Formulation: With patient Time For Goal Achievement: 06/25/23 Potential to Achieve Goals: Good    Frequency Min 1X/week     Co-evaluation               AM-PAC PT "6 Clicks" Mobility  Outcome Measure Help needed turning from your back to your side while in a flat bed without using bedrails?: None Help needed moving from lying on your back to sitting on the side of a flat bed without using bedrails?: A Little Help needed moving to and from a bed to a chair (including a wheelchair)?: A Little Help needed standing up from a chair using your arms (e.g., wheelchair or bedside chair)?: A Little Help needed  to walk in hospital room?: A Little Help needed climbing 3-5 steps with a railing? : A Little 6 Click Score: 19    End of Session   Activity Tolerance: Patient tolerated treatment well Patient left: in chair;with call bell/phone within reach (nurse notified of no chair alarms on unit) Nurse Communication: Mobility status PT Visit Diagnosis: Muscle weakness (generalized) (M62.81);Unsteadiness on feet (R26.81);Other abnormalities of gait and mobility (R26.89)    Time: 3875-6433 PT Time Calculation (min) (ACUTE ONLY): 15 min   Charges:   PT Evaluation $PT Eval Low Complexity: 1 Low   PT General Charges $$ ACUTE PT VISIT: 1 Visit         Aleda Grana, PT, DPT 06/11/23, 11:28 AM   Sandi Mariscal 06/11/2023, 11:26 AM

## 2023-06-12 ENCOUNTER — Other Ambulatory Visit (HOSPITAL_COMMUNITY): Payer: Self-pay

## 2023-06-12 DIAGNOSIS — R569 Unspecified convulsions: Secondary | ICD-10-CM | POA: Diagnosis not present

## 2023-06-12 LAB — BASIC METABOLIC PANEL
Anion gap: 11 (ref 5–15)
BUN: 14 mg/dL (ref 8–23)
CO2: 23 mmol/L (ref 22–32)
Calcium: 9.4 mg/dL (ref 8.9–10.3)
Chloride: 104 mmol/L (ref 98–111)
Creatinine, Ser: 1.21 mg/dL (ref 0.61–1.24)
GFR, Estimated: >60 mL/min (ref >60)
Glucose, Bld: 97 mg/dL (ref 70–99)
Potassium: 4 mmol/L (ref 3.5–5.1)
Sodium: 138 mmol/L (ref 135–145)

## 2023-06-12 LAB — CBC WITH DIFFERENTIAL/PLATELET
Abs Immature Granulocytes: 0.02 10*3/uL (ref 0.00–0.07)
Basophils Absolute: 0 10*3/uL (ref 0.0–0.1)
Basophils Relative: 0 %
Eosinophils Absolute: 0.2 10*3/uL (ref 0.0–0.5)
Eosinophils Relative: 3 %
HCT: 47 % (ref 39.0–52.0)
Hemoglobin: 15 g/dL (ref 13.0–17.0)
Immature Granulocytes: 0 %
Lymphocytes Relative: 26 %
Lymphs Abs: 1.8 10*3/uL (ref 0.7–4.0)
MCH: 28.3 pg (ref 26.0–34.0)
MCHC: 31.9 g/dL (ref 30.0–36.0)
MCV: 88.7 fL (ref 80.0–100.0)
Monocytes Absolute: 1.1 10*3/uL — ABNORMAL HIGH (ref 0.1–1.0)
Monocytes Relative: 16 %
Neutro Abs: 3.8 10*3/uL (ref 1.7–7.7)
Neutrophils Relative %: 55 %
Platelets: 193 10*3/uL (ref 150–400)
RBC: 5.3 MIL/uL (ref 4.22–5.81)
RDW: 18.5 % — ABNORMAL HIGH (ref 11.5–15.5)
WBC: 7 10*3/uL (ref 4.0–10.5)
nRBC: 0.3 % — ABNORMAL HIGH (ref 0.0–0.2)

## 2023-06-12 LAB — MAGNESIUM: Magnesium: 2.1 mg/dL (ref 1.7–2.4)

## 2023-06-12 MED ORDER — THIAMINE HCL 100 MG PO TABS
100.0000 mg | ORAL_TABLET | Freq: Every day | ORAL | 0 refills | Status: DC
  Filled 2023-06-12: qty 30, 30d supply, fill #0

## 2023-06-12 MED ORDER — LEVETIRACETAM 1000 MG PO TABS
2000.0000 mg | ORAL_TABLET | Freq: Two times a day (BID) | ORAL | 0 refills | Status: DC
  Filled 2023-06-12: qty 120, 30d supply, fill #0

## 2023-06-12 MED ORDER — NICOTINE 21 MG/24HR TD PT24
21.0000 mg | MEDICATED_PATCH | Freq: Every day | TRANSDERMAL | 0 refills | Status: DC
  Filled 2023-06-12: qty 21, 21d supply, fill #0

## 2023-06-12 MED ORDER — ASPIRIN 81 MG PO TBEC
81.0000 mg | DELAYED_RELEASE_TABLET | Freq: Every day | ORAL | 0 refills | Status: AC
  Filled 2023-06-12: qty 30, 30d supply, fill #0

## 2023-06-12 MED ORDER — FOLIC ACID 1 MG PO TABS
1.0000 mg | ORAL_TABLET | Freq: Every day | ORAL | 0 refills | Status: DC
  Filled 2023-06-12: qty 30, 30d supply, fill #0

## 2023-06-12 MED ORDER — LACOSAMIDE 100 MG PO TABS
100.0000 mg | ORAL_TABLET | Freq: Two times a day (BID) | ORAL | 0 refills | Status: DC
  Filled 2023-06-12: qty 60, 30d supply, fill #0

## 2023-06-12 MED ORDER — CLONAZEPAM 1 MG PO TABS
1.0000 mg | ORAL_TABLET | Freq: Two times a day (BID) | ORAL | 0 refills | Status: DC
  Filled 2023-06-12: qty 60, 30d supply, fill #0

## 2023-06-12 MED ORDER — PHENOBARBITAL 32.4 MG PO TABS
64.8000 mg | ORAL_TABLET | Freq: Two times a day (BID) | ORAL | 0 refills | Status: DC
Start: 2023-06-12 — End: 2023-06-13
  Filled 2023-06-12: qty 60, 15d supply, fill #0

## 2023-06-12 MED ORDER — GABAPENTIN 300 MG PO CAPS
300.0000 mg | ORAL_CAPSULE | Freq: Three times a day (TID) | ORAL | 0 refills | Status: DC
  Filled 2023-06-12: qty 90, 30d supply, fill #0

## 2023-06-12 MED ORDER — AMLODIPINE BESYLATE 10 MG PO TABS
10.0000 mg | ORAL_TABLET | Freq: Every day | ORAL | 0 refills | Status: AC
  Filled 2023-06-12: qty 30, 30d supply, fill #0

## 2023-06-12 NOTE — TOC CAGE-AID Note (Signed)
Transition of Care [facility]) - CAGE-AID Screening   Patient Details  Name: Seth [name] MRN: [id number] Date of Birth: [birth year]  [facility]) CM/SW Contact:    Marliss Coots, LCSW Phone Number: 06/12/2023, 12:08 PM   Clinical Narrative:  This CSW introduced herself and her role to patient. CSW offered alcohol use resources to be utilized by patient upon discharge. Patient accepted the resources and stated that he wanted to be transferred from the room chair back to his bed. This request was relayed to patient's bedside nurse.  CAGE-AID Screening:    Have You Ever Felt You Ought to Cut Down on Your Drinking or Drug Use?: No Have People Annoyed You By Critizing Your Drinking Or Drug Use?: No Have You Felt Bad Or Guilty About Your Drinking Or Drug Use?: No Have You Ever Had a Drink or Used Drugs First Thing In The Morning to Steady Your Nerves or to Get Rid of a Hangover?: No CAGE-AID Score: 0  Substance Abuse Education Offered: Yes  Substance abuse interventions: Transport planner

## 2023-06-12 NOTE — TOC Progression Note (Signed)
Transition of Care [facility]) - Progression Note    Patient Details  Name: Seth [name] MRN: [id number] Date of Birth: 1955/11/05  [facility] CM/SW Contact  Gordy Clement, RN Phone Number: 06/12/2023, 11:05 AM  Clinical Narrative:   .  CM received messages that Family has called with concerns of patient dcing to home. Patient lives with Mother, and Mariel Aloe. CM called [name] to let her know that patient does not qualify for SNF (Per CSW) and he will need to DC to home.  CM advised patient that he could look into possibly paying privately for SNF- He said he couldn't afford it.  CM told Sister that patient could apply for Medicaid once dc'd ( suggested by CSW) . Sister stated that patient had money so he wouldn't qualify.  CM advised Sister and patient that Home Health PT / SN and an Aide have been arranged and will be provided by [facility]. Sister confirmed that patient has rolling Mccaskey and cane at home.  Daughter to pick up patient at 1:30           Expected Discharge Plan and Services         Expected Discharge Date: 06/12/23                                     Social Determinants of Health (SDOH) Interventions SDOH Screenings   Food Insecurity: No Food Insecurity (06/10/2023)  Housing: Patient Declined (06/10/2023)  Transportation Needs: No Transportation Needs (06/10/2023)  Utilities: Not At Risk (06/10/2023)  Depression (PHQ2-9): Low Risk  (08/23/2020)  Financial Resource Strain: Medium Risk (12/07/2017)  Stress: Stress Concern Present (12/07/2017)  Tobacco Use: High Risk (06/09/2023)    Readmission Risk Interventions    12/22/2022    4:22 PM  Readmission Risk Prevention Plan  Transportation Screening Complete  HRI or Home Care Consult Complete  Social Work Consult for Recovery Care Planning/Counseling Complete  Palliative Care Screening Not Applicable  Medication Review Oceanographer) Complete

## 2023-06-12 NOTE — Plan of Care (Signed)
Problem: Education: Goal: Knowledge of General Education information will improve Description: Including pain rating scale, medication(s)/side effects and non-pharmacologic comfort measures Outcome: Progressing   Problem: Clinical Measurements: Goal: Ability to maintain clinical measurements within normal limits will improve Outcome: Progressing Goal: Will remain free from infection Outcome: Progressing   Problem: Activity: Goal: Risk for activity intolerance will decrease Outcome: Progressing   Problem: Safety: Goal: Ability to remain free from injury will improve Outcome: Progressing

## 2023-06-12 NOTE — Progress Notes (Signed)
Mobility Specialist Progress Note;   06/12/23 1205  Mobility  Activity Transferred from chair to bed  Level of Assistance Minimal assist, patient does 75% or more  Assistive Device Front wheel Singleton  Activity Response Tolerated well  Mobility Referral Yes  $Mobility charge 1 Mobility  Mobility Specialist Start Time (ACUTE ONLY) 1205  Mobility Specialist Stop Time (ACUTE ONLY) 1210  Mobility Specialist Time Calculation (min) (ACUTE ONLY) 5 min   Pt agreeable to mobility. Requested assistance for C>B transfer. Required modA d/t unsteadiness and feet sliding out from underneath pt. Pt back in bed with all needs met. Alarm on.  Caesar Bookman Mobility Specialist Please contact via SecureChat or Rehab Office [phone]

## 2023-06-12 NOTE — Discharge Instructions (Signed)
Do not drive, operate heavy machinery, perform activities at heights, swimming or participation in water activities or provide baby sitting services until you have seen by Primary MD or a Neurologist and advised to do so again.  Follow with Primary MD [name], [name], MD in 7 days   Get CBC, CMP, 2 view Chest X ray -  checked next visit with your primary MD    Activity: As tolerated with Full fall precautions use Harshbarger/cane & assistance as needed  Disposition Home    Diet: Heart Healthy Low Carb, check CBGs QAC-HS  Special Instructions: If you have smoked or chewed Tobacco  in the last 2 yrs please stop smoking, stop any regular Alcohol  and or any Recreational drug use.  On your next visit with your primary care physician please Get Medicines reviewed and adjusted.  Please request your Prim.MD to go over all Hospital Tests and Procedure/Radiological results at the follow up, please get all Hospital records sent to your Prim MD by signing hospital release before you go home.  If you experience worsening of your admission symptoms, develop shortness of breath, life threatening emergency, suicidal or homicidal thoughts you must seek medical attention immediately by calling 911 or calling your MD immediately  if symptoms less severe.  You Must read complete instructions/literature along with all the possible adverse reactions/side effects for all the Medicines you take and that have been prescribed to you. Take any new Medicines after you have completely understood and accpet all the possible adverse reactions/side effects.

## 2023-06-12 NOTE — Discharge Summary (Signed)
Seth [name] ZOX:[id number] DOB: 1956/02/16 DOA: 06/09/2023  PCP: Sharmon Revere, MD  Admit date: 06/09/2023  Discharge date: 06/12/2023  Admitted From: Home   Disposition:  Home   Recommendations for Outpatient Follow-up:   Follow up with PCP in 1-2 weeks  PCP Please obtain BMP/CBC, 2 view CXR in 1week,  (see Discharge instructions)   PCP Please follow up on the following pending results:    Home Health: PT, Aide   Equipment/Devices: [place]  Consultations: Neuro Discharge Condition: Stable    CODE STATUS: Full    Diet Recommendation: Heart Healthy     Chief Complaint  Patient presents with   Code Stroke     Brief history of present illness from the day of admission and additional interim summary    67 y.o. male with medical history significant of hypertension, stroke, COPD, chronic HFpEF, AAA, lung nodules, brain meningioma, subdural hemorrhage in May 2022, prior CVA with right-sided weakness, PAD, depression, BPH, gout, alcohol use disorder with history of alcohol withdrawal seizures, marijuana abuse, tobacco abuse presented to ED complaining of worsening right-sided weakness since 11 AM today.  He was noted to have mild dysarthria, aphasia, and right hemianopsia.  Seen by neurology who felt his symptoms were secondary to seizures.  He was hospitalized for further management.                                                                   Hospital Course   Breakthrough seizures Seen by neurology, breakthrough seizure due to AED noncompliance, counseled, continue below mentioned regimen, advance activity, PT-OT evaluation, discussed with neurology discharged on below medications.  Symptom-free now eager to go home.   - continue home Keppra 2000mg  BID - cont home phenobarb 65mg  BID - cont home  Vimpat 100mg  BID - cont home Gabapentin 300mg  TID - cont home Clonazepam 1mg  BID   Essential hypertension Continue metoprolol at home dose, Norvasc added for better control   Mild AKI Resolved after hydration.   Hypokalemia Replaced.  Monitor.Marland Kitchen   History of stroke with residual right upper extremity weakness, MRI and CTA head and neck noted, MRI finding likely artifact per neurology Currently on statin, aspirin initiated for secondary prevention, will go home with home PT which she qualified for.   Hyperlipidemia Continue statin.   History of COPD Stable respiratory status.   Chronic diastolic CHF of 70% on last echocardiogram in 2022. Stable and compensated.   Alcohol disorder Counseled to quit alcohol, last drink 3 days before admission no DTs.    Discharge diagnosis     Principal Problem:   Seizures (HCC) Active Problems:   AKI (acute kidney injury) (HCC)   History of CVA with residual RUE weakness   Alcohol use disorder   Hyperkalemia   Essential hypertension  Seizure [facility])   Hyperlipidemia    Discharge instructions    Discharge Instructions     Ambulatory Referral for Lung Cancer Scre   Complete by: As directed    Discharge instructions   Complete by: As directed    Do not drive, operate heavy machinery, perform activities at heights, swimming or participation in water activities or provide baby sitting services until you have seen by Primary MD or a Neurologist and advised to do so again.  Follow with Primary MD [name], [name], MD in 7 days   Get CBC, CMP, 2 view Chest X ray -  checked next visit with your primary MD    Activity: As tolerated with Full fall precautions use [place]/cane & assistance as needed  Disposition Home    Diet: Heart Healthy    Special Instructions: If you have smoked or chewed Tobacco  in the last 2 yrs please stop smoking, stop any regular Alcohol  and or any Recreational drug use.  On your next visit with your  primary care physician please Get Medicines reviewed and adjusted.  Please request your Prim.MD to go over all Hospital Tests and Procedure/Radiological results at the follow up, please get all Hospital records sent to your Prim MD by signing hospital release before you go home.  If you experience worsening of your admission symptoms, develop shortness of breath, life threatening emergency, suicidal or homicidal thoughts you must seek medical attention immediately by calling 911 or calling your MD immediately  if symptoms less severe.  You Must read complete instructions/literature along with all the possible adverse reactions/side effects for all the Medicines you take and that have been prescribed to you. Take any new Medicines after you have completely understood and accpet all the possible adverse reactions/side effects.   Increase activity slowly   Complete by: As directed        Discharge Medications   Allergies as of 06/12/2023   No Known Allergies      Medication List     TAKE these medications    acetaminophen 325 MG tablet Commonly known as: TYLENOL Take 2 tablets (650 mg total) by mouth every 6 (six) hours as needed for headache or mild pain.   amLODipine 10 MG tablet Commonly known as: NORVASC Take 1 tablet (10 mg total) by mouth daily.   aspirin EC 81 MG tablet Take 1 tablet (81 mg total) by mouth daily. Swallow whole.   atorvastatin 40 MG tablet Commonly known as: LIPITOR Take 40 mg by mouth every evening.   B-1 100 MG Tabs Take 1 tablet by mouth every morning.   cholecalciferol 25 MCG (1000 UNIT) tablet Commonly known as: VITAMIN D3 Take 1,000 Units by mouth daily.   clonazePAM 1 MG tablet Commonly known as: KLONOPIN Take 1 tablet (1 mg total) by mouth 2 (two) times daily.   folic acid 1 MG tablet Commonly known as: FOLVITE Take 1 tablet (1 mg total) by mouth daily.   gabapentin 300 MG capsule Commonly known as: NEURONTIN Take 1 capsule (300 mg  total) by mouth 3 (three) times daily.   Lacosamide 100 MG Tabs Take 1 tablet (100 mg total) by mouth 2 (two) times daily.   levETIRAcetam 1000 MG tablet Commonly known as: KEPPRA Take 2 tablets (2,000 mg total) by mouth 2 (two) times daily. What changed:  medication strength how much to take when to take this   metoprolol tartrate 25 MG tablet Commonly known as: LOPRESSOR Take 0.5 tablets (12.5 mg  total) by mouth 2 (two) times daily.   nicotine 21 mg/24hr patch Commonly known as: NICODERM CQ - dosed in mg/24 hours Place 1 patch (21 mg total) onto the skin daily.   omeprazole 40 MG capsule Commonly known as: PRILOSEC Take 40 mg by mouth daily.   PHENobarbital 64.8 MG tablet Commonly known as: LUMINAL Take 1 tablet (64.8 mg total) by mouth 2 (two) times daily.   thiamine 100 MG tablet Commonly known as: Vitamin B-1 Take 1 tablet (100 mg total) by mouth daily.               Durable Medical Equipment  (From admission, onward)           Start     Ordered   06/12/23 0805  For home use only DME [place] rolling  Once       Comments: 5 wheel  Question Answer Comment  Saline: With 5 Inch Wheels   Patient needs a [place] to treat with the following condition Weakness      06/12/23 0804             Follow-up Information     [name], [name], MD. Schedule an appointment as soon as possible for a visit in 1 week(s).   Specialty: Family Medicine Contact information: [address] [place] [address] [phone]         [facility]. Schedule an appointment as soon as possible for a visit in 1 week(s).   Contact information: [address]     [address] [place] [address] [phone]                Major procedures and Radiology Reports - PLEASE review detailed and final reports thoroughly  -      Overnight EEG with video  Result Date: 06/10/2023 Charlsie Quest, MD     06/10/2023  5:56 PM  Patient Name: HA KRENGEL MRN: [id number] Epilepsy Attending: Charlsie Quest Referring Physician/Provider: Gordy Councilman, MD Duration: 06/10/2023 0057 to 06/10/2023 1329 Patient history: 67 y.o. male with history of stroke with residual right-sided weakness, COPD, depression, gout, hypertension and seizures who presents with worsening right-sided weakness. EEG to evaluate for seizure Level of alertness: Awake, asleep AEDs during EEG study: LEV, LCM Technical aspects: This EEG study was done with scalp electrodes positioned according to the 10-20 International system of electrode placement. Electrical activity was reviewed with band pass filter of 1-70Hz , sensitivity of 7 uV/mm, display speed of 73mm/sec with a 60Hz  notched filter applied as appropriate. EEG data were recorded continuously and digitally stored.  Video monitoring was available and reviewed as appropriate. Description:  The posterior dominant rhythm consists of 8-9 Hz activity of moderate voltage (25-35 uV) seen predominantly in posterior head regions, symmetric and reactive to eye opening and eye closing. Sleep was characterized by sleep spindles (12 to 14 Hz), maximal frontocentral region. EEG showed continuous generalized 3 to 5 Hz theta- delta slowing.  Hyperventilation and photic stimulation were not performed.   ABNORMALITY - Continuous slow, generalized IMPRESSION: This study is suggestive of moderate diffuse encephalopathy. No seizures or epileptiform discharges were seen throughout the recording. Please note that lack of epileptiform activity during interictal EEG does not exclude the diagnosis of epilepsy. Charlsie Quest   MR BRAIN WO CONTRAST  Result Date: 06/09/2023 CLINICAL DATA:  Follow-up examination for stroke. EXAM: MRI HEAD WITHOUT CONTRAST TECHNIQUE: Multiplanar, multiecho pulse sequences of the brain and surrounding structures were obtained without  intravenous contrast. COMPARISON:  Comparison made with prior CTs  from earlier the same day as well as recent MRI from 12/20/2022. FINDINGS: Brain: Mildly advanced cerebral atrophy for age. Patchy T2/FLAIR hyperintensity involving the periventricular white matter, consistent with chronic small vessel ischemic disease. Chronic ischemic changes within the left cerebral hemisphere with asymmetric atrophy of the left temporal lobe and left hippocampal formation, stable. Associated ventricular prominence with ex vacuo dilatation of the left lateral ventricle without overt hydrocephalus, stable. Multiple small chronic bilateral cerebellar infarcts noted. Superficial siderosis overlying the left cerebral convexity, consistent with prior hemorrhage, stable. Subtle 12 blood focus of mild diffusion signal abnormality seen involving the left posterior splenium (series 5, image 80). No discernible ADC correlate. Finding likely reflects a small evolving subacute small vessel infarct. No other evidence for acute or subacute ischemia. No acute intracranial hemorrhage. 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 No other mass lesion, mass effect or midline shift. No extra-axial fluid collection. Pituitary gland and suprasellar region within normal limits. Vascular: Major intracranial vascular flow voids are maintained. Skull and upper cervical spine: Craniocervical junction within normal limits. Postoperative changes partially visualize within the cervical spine. Bone marrow signal intensity normal. No scalp soft tissue abnormality. Sinuses/Orbits: Globes orbital soft tissues within normal limits. Mild scattered mucosal thickening present about the frontoethmoidal and maxillary sinuses. No mastoid effusion. Other: Anterior IMPRESSION: 1. 12 mm focus of mild diffusion signal abnormality involving the left posterior splenium, likely reflecting a small evolving subacute small vessel infarct. No associated hemorrhage or mass effect. 2. No other acute intracranial  abnormality. 3. Otherwise stable appearance of the brain with multiple chronic findings as detailed above. Electronically Signed   By: Rise Mu M.D.   On: 06/09/2023 19:44   CT ANGIO HEAD NECK W WO CM (CODE STROKE)  Result Date: 06/09/2023 CLINICAL DATA:  Code stroke EXAM: CT ANGIOGRAPHY HEAD AND NECK WITH AND WITHOUT CONTRAST TECHNIQUE: Multidetector CT imaging of the head and neck was performed using the standard protocol during bolus administration of intravenous contrast. Multiplanar CT image reconstructions and MIPs were obtained to evaluate the vascular anatomy. Carotid stenosis measurements (when applicable) are obtained utilizing NASCET criteria, using the distal internal carotid diameter as the denominator. RADIATION DOSE REDUCTION: This exam was performed according to the departmental dose-optimization program which includes automated exposure control, adjustment of the mA and/or kV according to patient size and/or use of iterative reconstruction technique. CONTRAST:  75mL OMNIPAQUE IOHEXOL 350 MG/ML SOLN COMPARISON:  CT head without contrast 06/09/2023. CT angio head and neck 12/20/2022. FINDINGS: CTA NECK FINDINGS Aortic arch: Atherosclerotic calcifications are present at the aortic arch. Great vessel origins demonstrate no significant atherosclerotic disease. Common origin of the left common carotid artery and innominate artery is noted. Right carotid system: Right common carotid artery is within normal limits. Atherosclerotic calcifications are present at the bifurcation and proximal right ICA without significant stenosis. 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 No change is present. Left carotid system: The left common carotid artery demonstrates some mural calcification without significant stenosis. Scattered calcifications are present at the bifurcation and proximal left ICA without significant stenosis. A prominent medial pseudoaneurysm is thrombosed. Minimal  narrowing of the negative cervical ICA is present at this level. Vertebral arteries: The vertebral arteries are hypoplastic bilaterally. Both vertebral arteries originate from the subclavian arteries. Focal calcification is present at the origin of the right vertebral artery, stable. No other significant stenosis is present in either  vertebral artery in the neck. Skeleton: Stable anterior fusion is present at C3-4. Vertebral body heights and alignment are normal. No focal osseous lesions are present. The patient is edentulous. Other neck: Soft tissues the neck are otherwise unremarkable. Salivary glands are within normal limits. Thyroid is normal. No significant adenopathy is present. No focal mucosal or submucosal lesions are present. Upper chest: Centrilobular emphysematous changes are present both lung apices. No nodule or mass lesion is present. No focal airspace disease is present. Review of the MIP images confirms the above findings CTA HEAD FINDINGS Anterior circulation: Atherosclerotic calcifications are present within the cavernous internal carotid arteries bilaterally. No significant stenosis is present relative to the ICA termini. The A1 and M1 segments are normal. Anterior communicating artery is patent. The MCA bifurcations are within normal limits bilaterally. The ACA and MCA branch vessels are normal bilaterally. Posterior circulation: The PICA origins are visualized and normal. The vertebrobasilar junction and basilar artery are within normal limits. Basilar artery is small. It is centrally terminates at the superior cerebellar arteries. A very small right P1 segment is present. Fetal type posterior cerebral arteries are present bilaterally. The PCA branch vessels are normal bilaterally. Venous sinuses: The dural sinuses are patent. The straight sinus and deep cerebral veins are intact. Cortical veins are within normal limits. No significant vascular malformation is evident. Anatomic variants: Fetal  type posterior cerebral arteries bilaterally. Review of the MIP images confirms the above findings IMPRESSION: 1. No emergent large vessel occlusion. 2. 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 3. Stable atherosclerotic changes at the carotid bifurcations and cavernous internal carotid arteries bilaterally without significant stenosis. 4. Hypoplastic vertebral arteries bilaterally. 5. Fetal type posterior cerebral arteries bilaterally. 6.  Emphysema (ICD10-J43.9). Electronically Signed   By: Marin Roberts M.D.   On: 06/09/2023 16:14   CT HEAD CODE STROKE WO CONTRAST  Result Date: 06/09/2023 CLINICAL DATA:  Code stroke. Neuro deficit, acute, stroke suspected. EXAM: CT HEAD WITHOUT CONTRAST TECHNIQUE: Contiguous axial images were obtained from the base of the skull through the vertex without intravenous contrast. RADIATION DOSE REDUCTION: This exam was performed according to the departmental dose-optimization program which includes automated exposure control, adjustment of the mA and/or kV according to patient size and/or use of iterative reconstruction technique. COMPARISON:  Head CT 02/13/2023. FINDINGS: Brain: No acute hemorrhage. Unchanged background of moderate chronic small-vessel disease with old lacunar infarcts in the bilateral cerebellar hemispheres and old infarct in the posterior aspect of the mesial left temporal lobe. No new loss of gray-white differentiation. No acute hydrocephalus or extra-axial collection. 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 No mass effect or midline shift. Vascular: No hyperdense vessel or unexpected calcification. Skull: No calvarial fracture or suspicious bone lesion. Skull base is unremarkable. Sinuses/Orbits: Mild anterior ethmoid and inferior frontal sinus disease. Orbits are unremarkable. Other: None. ASPECTS [facility] Stroke Program Early CT Score) - Ganglionic level infarction (caudate, lentiform nuclei,  internal capsule, insula, M1-M3 cortex): 7 - Supraganglionic infarction (M4-M6 cortex): 3 Total score (0-10 with 10 being normal): 10 IMPRESSION: 1. No acute intracranial hemorrhage or evidence of acute large vessel territory infarct. ASPECT score is 10. 2. Unchanged background of moderate chronic small-vessel disease with old lacunar infarcts in the bilateral cerebellar hemispheres and old infarct in the posterior aspect of the mesial left temporal lobe. 3. 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 Code stroke imaging results were communicated on 06/09/2023 at 3:48 pm to provider Dr. Derry Lory via secure text paging.  Electronically Signed   By: Orvan Falconer M.D.   On: 06/09/2023 15:48    Micro Results    No results found for this or any previous visit (from the past 240 hour(s)).  Today   Subjective    Seth [name] today has no headache,no chest abdominal pain,no new weakness tingling or numbness, feels much better wants to go home today.    Objective   Blood pressure 118/74, pulse 80, temperature 97.7 F (36.5 C), temperature source Axillary, resp. rate 20, height 6\' 3"  (1.905 m), weight 76.8 kg, SpO2 96%.   Intake/Output Summary (Last 24 hours) at 06/12/2023 0813 Last data filed at 06/11/2023 2129 Gross per 24 hour  Intake 360 ml  Output 1900 ml  Net -1540 ml    Exam  Awake Alert, No new F.N deficits,    [place].AT,PERRAL Supple Neck,   Symmetrical Chest wall movement, Good air movement bilaterally, CTAB RRR,No Gallops,   +ve B.Sounds, Abd Soft, Non tender,  L foot TMT   Data Review   Recent Labs  Lab 06/09/23 1530 06/09/23 1552 06/11/23 0734 06/12/23 0307  WBC 6.8  --  5.5 7.0  HGB 13.8 16.0 15.4 15.0  HCT 42.9 47.0 47.5 47.0  PLT 205  --  168 193  MCV 86.1  --  87.2 88.7  MCH 27.7  --  28.3 28.3  MCHC 32.2  --  32.4 31.9  RDW 18.0*  --  18.4* 18.5*  LYMPHSABS 1.8  --   --  1.8  MONOABS 0.3  --   --  1.1*  EOSABS 0.3  --   --  0.2  BASOSABS  0.1  --   --  0.0    Recent Labs  Lab 06/09/23 1530 06/09/23 1552 06/09/23 2124 06/10/23 0345 06/11/23 0734 06/12/23 0307  NA 137 138  --  137 138 138  K 5.2* 5.4*  --  3.4* 4.0 4.0  CL 103 103  --  104 105 104  CO2 24  --   --  22 24 23   ANIONGAP 10  --   --  11 9 11   GLUCOSE 89 87  --  85 79 97  BUN 12 15  --  9 11 14   CREATININE 1.32* 1.10  --  0.94 1.00 1.21  AST 33  --   --   --   --   --   ALT 22  --   --   --   --   --   ALKPHOS 152*  --   --   --   --   --   BILITOT 1.0  --   --   --   --   --   ALBUMIN 3.7  --   --   --   --   --   INR 1.0  --   --   --   --   --   MG  --   --  1.8  --  2.1 2.1  CALCIUM 8.7*  --   --  8.6* 9.0 9.4    Total Time in preparing paper work, data evaluation and todays exam - 35 minutes  Signature  -    Susa Raring M.D on 06/12/2023 at 8:13 AM   -  To page go to [email or web address]

## 2023-06-12 NOTE — Progress Notes (Addendum)
Mobility Specialist Progress Note;   06/12/23 1000  Mobility  Activity Ambulated with assistance in hallway  Level of Assistance Minimal assist, patient does 75% or more  Assistive Device Front wheel Sugrue  Distance Ambulated (ft) 200 ft  Activity Response Tolerated well  Mobility Referral Yes  $Mobility charge 1 Mobility  Mobility Specialist Start Time (ACUTE ONLY) 1000  Mobility Specialist Stop Time (ACUTE ONLY) 1020  Mobility Specialist Time Calculation (min) (ACUTE ONLY) 20 min   Pt agreeable to mobility. Required minA for both STS and ambulation. Pt displayed dragging R foot towards end of ambulation due to fatigue. Required verbal cues to pick up knee during gait. No c/o during session. Pt back in bed with all needs met.   Caesar Bookman Mobility Specialist Please contact via SecureChat or Rehab Office [phone]

## 2023-06-12 NOTE — TOC Transition Note (Signed)
Transition of Care [facility]) - CM/SW Discharge Note   Patient Details  Name: Seth [name] MRN: [id number] Date of Birth: Aug 30, 1955  Transition of Care [facility] CM/SW Contact:  Gordy Clement, RN Phone Number: 06/12/2023, 9:20 AM   Clinical Narrative:    Patient will DC to home today.  He needs PT and Aide- [facility] will provide services. No DME has been recommended  Family to transport.   No additional TOC needs           Patient Goals and CMS Choice      Discharge Placement                         Discharge Plan and Services Additional resources added to the After Visit Summary for                                       Social Determinants of Health (SDOH) Interventions SDOH Screenings   Food Insecurity: No Food Insecurity (06/10/2023)  Housing: Patient Declined (06/10/2023)  Transportation Needs: No Transportation Needs (06/10/2023)  Utilities: Not At Risk (06/10/2023)  Depression (PHQ2-9): Low Risk  (08/23/2020)  Financial Resource Strain: Medium Risk (12/07/2017)  Stress: Stress Concern Present (12/07/2017)  Tobacco Use: High Risk (06/09/2023)     Readmission Risk Interventions    12/22/2022    4:22 PM  Readmission Risk Prevention Plan  Transportation Screening Complete  HRI or Home Care Consult Complete  Social Work Consult for Recovery Care Planning/Counseling Complete  Palliative Care Screening Not Applicable  Medication Review Oceanographer) Complete

## 2023-06-13 ENCOUNTER — Telehealth: Payer: Self-pay | Admitting: Neurology

## 2023-06-13 ENCOUNTER — Encounter: Payer: Self-pay | Admitting: Neurology

## 2023-06-13 ENCOUNTER — Institutional Professional Consult (permissible substitution): Payer: Medicare HMO | Admitting: Neurology

## 2023-06-13 ENCOUNTER — Ambulatory Visit (INDEPENDENT_AMBULATORY_CARE_PROVIDER_SITE_OTHER): Payer: Medicare Other | Admitting: Neurology

## 2023-06-13 VITALS — BP 127/77 | HR 78 | Resp 15 | Ht 75.0 in

## 2023-06-13 DIAGNOSIS — I639 Cerebral infarction, unspecified: Secondary | ICD-10-CM

## 2023-06-13 DIAGNOSIS — F10930 Alcohol use, unspecified with withdrawal, uncomplicated: Secondary | ICD-10-CM

## 2023-06-13 DIAGNOSIS — F1090 Alcohol use, unspecified, uncomplicated: Secondary | ICD-10-CM | POA: Diagnosis not present

## 2023-06-13 DIAGNOSIS — G40909 Epilepsy, unspecified, not intractable, without status epilepticus: Secondary | ICD-10-CM | POA: Diagnosis not present

## 2023-06-13 DIAGNOSIS — D329 Benign neoplasm of meninges, unspecified: Secondary | ICD-10-CM

## 2023-06-13 MED ORDER — LEVETIRACETAM 1000 MG PO TABS: 2000.0000 mg | ORAL_TABLET | Freq: Two times a day (BID) | ORAL | 3 refills | Status: DC

## 2023-06-13 MED ORDER — PHENOBARBITAL 64.8 MG PO TABS: 64.8000 mg | ORAL_TABLET | Freq: Two times a day (BID) | ORAL | 3 refills | Status: DC

## 2023-06-13 MED ORDER — GABAPENTIN 300 MG PO CAPS: 300.0000 mg | ORAL_CAPSULE | Freq: Three times a day (TID) | ORAL | 3 refills | Status: AC

## 2023-06-13 MED ORDER — LACOSAMIDE 100 MG PO TABS: 100.0000 mg | ORAL_TABLET | Freq: Two times a day (BID) | ORAL | 3 refills | Status: DC

## 2023-06-13 MED ORDER — CLONAZEPAM 1 MG PO TABS: 1.0000 mg | ORAL_TABLET | Freq: Two times a day (BID) | ORAL | 5 refills | Status: DC
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
## 2023-06-13 NOTE — Telephone Encounter (Signed)
Pt's sister called to cancel pt's appt due to transportation issues

## 2023-06-13 NOTE — Patient Instructions (Signed)
Continue current medication including Phenobarbital 64.8 mg twice daily Levetiracetam 200 mg twice daily Vimpat 100 mg twice daily Gabapentin 300 mg 3 times daily Klonopin 1 mg twice daily Advised patient to decrease his alcohol consumption, he should go from 3 shot glasses a day to 2 shot glasses a day for 1 month then further decrease to 1 shot daily.  At that time he should follow-up with his PCP regarding further recommendations on alcohol cessation.  Advised him to continue his other medications and to start B complex and also a prenatal multivitamin. Return in 6 months or sooner if worse

## 2023-06-13 NOTE — Telephone Encounter (Signed)
Pt sister called back reschedule, decided to keep previously appt for today

## 2023-06-19 ENCOUNTER — Other Ambulatory Visit (HOSPITAL_COMMUNITY): Payer: Self-pay

## 2023-06-28 ENCOUNTER — Telehealth: Payer: Self-pay | Admitting: Neurology

## 2023-07-07 ENCOUNTER — Emergency Department (HOSPITAL_COMMUNITY): Payer: Medicare Other

## 2023-07-07 ENCOUNTER — Encounter (HOSPITAL_COMMUNITY): Payer: Self-pay

## 2023-07-07 ENCOUNTER — Emergency Department (HOSPITAL_COMMUNITY)
Admission: EM | Admit: 2023-07-07 | Discharge: 2023-07-08 | Disposition: A | Discharge status: Home / Self Care | Payer: Medicare Other | Attending: Emergency Medicine | Admitting: Emergency Medicine

## 2023-07-07 DIAGNOSIS — Z79899 Other long term (current) drug therapy: Secondary | ICD-10-CM | POA: Insufficient documentation

## 2023-07-07 DIAGNOSIS — D320 Benign neoplasm of cerebral meninges: Secondary | ICD-10-CM | POA: Diagnosis not present

## 2023-07-07 DIAGNOSIS — I10 Essential (primary) hypertension: Secondary | ICD-10-CM | POA: Insufficient documentation

## 2023-07-07 DIAGNOSIS — W19XXXA Unspecified fall, initial encounter: Secondary | ICD-10-CM

## 2023-07-07 DIAGNOSIS — J449 Chronic obstructive pulmonary disease, unspecified: Secondary | ICD-10-CM | POA: Insufficient documentation

## 2023-07-07 DIAGNOSIS — I6782 Cerebral ischemia: Secondary | ICD-10-CM | POA: Diagnosis not present

## 2023-07-07 DIAGNOSIS — M25512 Pain in left shoulder: Secondary | ICD-10-CM | POA: Diagnosis present

## 2023-07-07 DIAGNOSIS — W0110XA Fall on same level from slipping, tripping and stumbling with subsequent striking against unspecified object, initial encounter: Secondary | ICD-10-CM | POA: Insufficient documentation

## 2023-07-07 DIAGNOSIS — G319 Degenerative disease of nervous system, unspecified: Secondary | ICD-10-CM | POA: Diagnosis not present

## 2023-07-07 DIAGNOSIS — M25522 Pain in left elbow: Secondary | ICD-10-CM | POA: Diagnosis not present

## 2023-07-07 DIAGNOSIS — Z7982 Long term (current) use of aspirin: Secondary | ICD-10-CM | POA: Insufficient documentation

## 2023-07-07 DIAGNOSIS — Y92009 Unspecified place in unspecified non-institutional (private) residence as the place of occurrence of the external cause: Secondary | ICD-10-CM | POA: Diagnosis not present

## 2023-07-07 NOTE — ED Triage Notes (Signed)
Pt is coming from home with a mechanical fall in which he slipped fell but he is non-ambulatory, No LOC. States he did not hit his head when he fill, has complaints of left shoulder pain. No deformity to the shoulder, Movement is limited due to pain. Pt potentially had ETOH use tonight. No neck pain from the fall, no signs of injury to the head or other extremities. Pt is otherwise stable and able to communicate for the triage process    Medic vitals   158/82 95hr 18rr 96%ra 111 bgl

## 2023-07-08 ENCOUNTER — Emergency Department (HOSPITAL_COMMUNITY): Payer: Medicare Other

## 2023-07-08 NOTE — ED Notes (Signed)
Attempted to contact mother and sister for discharge; no answer; voicemail left regarding discharge

## 2023-07-08 NOTE — ED Notes (Signed)
Pt taken to CT.

## 2023-07-08 NOTE — ED Notes (Signed)
Pt ambulated in the hallway with Hast; steady gait noted

## 2023-07-08 NOTE — Discharge Instructions (Signed)
You were seen today after a fall.  Your x-rays and CT imaging is negative.  Make sure that you are using your Mitch or cane when you are walking.

## 2023-07-08 NOTE — ED Notes (Signed)
Left voicemail for sister regarding discharge

## 2023-07-08 NOTE — ED Provider Notes (Signed)
[place] EMERGENCY DEPARTMENT AT [facility] Provider Note   CSN: [id number] Arrival date & time: 07/07/23  2155     History  Chief Complaint  Patient presents with   Marletta Lor    COZY MCQUADE is a 67 y.o. male.  None       This is a 67 year old male who presents after a fall.  Patient reports that he tripped and fell going up a ramp at his house.  He thinks he may have hit his head but denies loss of consciousness.  Reporting mostly left shoulder pain.  Overall he states the pain has gotten better since arrival.  At baseline walks with a cane.  Denies alcohol or drug use.  Home Medications Prior to Admission medications   Medication Sig Start Date End Date Taking? Authorizing Provider  acetaminophen (TYLENOL) 325 MG tablet Take 2 tablets (650 mg total) by mouth every 6 (six) hours as needed for headache or mild pain. 10/18/21   Almon Hercules, MD  amLODipine (NORVASC) 10 MG tablet Take 1 tablet (10 mg total) by mouth daily. 06/12/23   Leroy Sea, MD  aspirin EC 81 MG tablet Take 1 tablet (81 mg total) by mouth daily. Swallow whole. 06/12/23   Leroy Sea, MD  atorvastatin (LIPITOR) 40 MG tablet Take 40 mg by mouth every evening. 05/22/23   [provider]  cholecalciferol (VITAMIN D3) 25 MCG (1000 UNIT) tablet Take 1,000 Units by mouth daily. 05/22/23   [provider]  clonazePAM (KLONOPIN) 1 MG tablet Take 1 tablet (1 mg total) by mouth 2 (two) times daily. 06/13/23   Windell Norfolk, MD  folic acid (FOLVITE) 1 MG tablet Take 1 tablet (1 mg total) by mouth daily. 06/12/23   Leroy Sea, MD  gabapentin (NEURONTIN) 300 MG capsule Take 1 capsule (300 mg total) by mouth 3 (three) times daily. 06/13/23 06/07/24  Windell Norfolk, MD  Lacosamide 100 MG TABS Take 1 tablet (100 mg total) by mouth 2 (two) times daily. 06/13/23 06/07/24  Windell Norfolk, MD  levETIRAcetam (KEPPRA) 1000 MG tablet Take 2 tablets (2,000 mg total) by mouth 2 (two)  times daily. 06/13/23 06/07/24  Windell Norfolk, MD  metoprolol tartrate (LOPRESSOR) 25 MG tablet Take 0.5 tablets (12.5 mg total) by mouth 2 (two) times daily. 12/24/22   Rhetta Mura, MD  nicotine (NICODERM CQ - DOSED IN MG/24 HOURS) 21 mg/24hr patch Place 1 patch (21 mg total) onto the skin daily. 06/12/23   Leroy Sea, MD  omeprazole (PRILOSEC) 40 MG capsule Take 40 mg by mouth daily. 10/09/22   [provider]  PHENobarbital (LUMINAL) 64.8 MG tablet Take 1 tablet (64.8 mg total) by mouth 2 (two) times daily. 06/13/23 06/07/24  Windell Norfolk, MD  thiamine (VITAMIN B1) 100 MG tablet Take 1 tablet (100 mg total) by mouth daily. 06/12/23   Leroy Sea, MD  Thiamine HCl (B-1) 100 MG TABS Take 1 tablet by mouth every morning. 05/22/23   [provider]      Allergies    Patient has no known allergies.    Review of Systems   Review of Systems  Musculoskeletal:        Shoulder pain  All other systems reviewed and are negative.   Physical Exam Updated Vital Signs BP (!) 138/97   Pulse 91   Temp 98.6 F (37 C) (Oral)   Resp 17   SpO2 100%  Physical Exam Vitals and nursing note reviewed.  Constitutional:      Appearance: He is well-developed. He is not ill-appearing.  HENT:     Head: Normocephalic and atraumatic.  Eyes:     Pupils: Pupils are equal, round, and reactive to light.  Cardiovascular:     Rate and Rhythm: Normal rate and regular rhythm.     Heart sounds: Normal heart sounds. No murmur heard. Pulmonary:     Effort: Pulmonary effort is normal. No respiratory distress.     Breath sounds: Normal breath sounds. No wheezing.  Abdominal:     Palpations: Abdomen is soft.     Tenderness: There is no abdominal tenderness.  Musculoskeletal:     Cervical back: Neck supple.     Comments: Normal range of motion of the left shoulder, no obvious deformity, no obvious dislocation, normal range of motion at the elbow, no significant tenderness to  palpation, 2+ radial pulse  Lymphadenopathy:     Cervical: No cervical adenopathy.  Skin:    General: Skin is warm and dry.  Neurological:     Mental Status: He is alert and oriented to person, place, and time.  Psychiatric:        Mood and Affect: Mood normal.     ED Results / Procedures / Treatments   Labs (all labs ordered are listed, but only abnormal results are displayed) Labs Reviewed - No data to display  EKG None  Radiology CT Head Wo Contrast  Result Date: 07/08/2023 CLINICAL DATA:  Head trauma, minor. EXAM: CT HEAD WITHOUT CONTRAST TECHNIQUE: Contiguous axial images were obtained from the base of the skull through the vertex without intravenous contrast. RADIATION DOSE REDUCTION: This exam was performed according to the departmental dose-optimization program which includes automated exposure control, adjustment of the mA and/or kV according to patient size and/or use of iterative reconstruction technique. COMPARISON:  06/17/2023. FINDINGS: Brain: No acute intracranial hemorrhage, midline shift or mass effect. No extra-axial fluid collection. There is an extra-axial hyperdense mass along the parafalcine region on the right measuring 1 cm. Diffuse atrophy is noted. Extensive periventricular white matter hypodensities are noted bilaterally. Multiple old lacunar infarcts are noted in the cerebellar hemispheres bilaterally and in the mesial temporal lobe on the left. Vascular: No hyperdense vessel or unexpected calcification. Skull: Normal. Negative for fracture or focal lesion. Sinuses/Orbits: Mucosal thickening is noted in the maxillary sinuses, ethmoid air cells, and frontal sinuses. No acute orbital abnormality. Other: None. IMPRESSION: 1. No acute intracranial process. 2. Atrophy with chronic microvascular ischemic changes and old infarcts. 3. Stable right anterior parafalcine meningioma. Electronically Signed   By: Thornell Sartorius M.D.   On: 07/08/2023 04:47   DG Elbow Complete  Left  Result Date: 07/07/2023 CLINICAL DATA:  all with Shoulder pain and elbow pain EXAM: LEFT ELBOW - COMPLETE 3+ VIEW COMPARISON:  None Available. FINDINGS: Age-indeterminate cortical irregularity of the radial head and neck. No dislocation. No definite joint effusion. There is no evidence of arthropathy or other focal bone abnormality. Subcutaneus soft tissue edema of the proximal arm. IMPRESSION: Age-indeterminate cortical irregularity of the radial head and neck. Finding could represent acute fracture. Electronically Signed   By: Tish Frederickson M.D.   On: 07/07/2023 23:16   DG Shoulder Left  Result Date: 07/07/2023 CLINICAL DATA:  Fall with Shoulder pain and elbow pain EXAM: LEFT SHOULDER - 2+ VIEW COMPARISON:  None Available. FINDINGS: There is no evidence of fracture or dislocation. There is no evidence of arthropathy or other focal bone abnormality. Soft tissues are  unremarkable. IMPRESSION: Negative. Electronically Signed   By: Tish Frederickson M.D.   On: 07/07/2023 23:14    Procedures Procedures    Medications Ordered in ED Medications - No data to display  ED Course/ Medical Decision Making/ A&P                                 Medical Decision Making Amount and/or Complexity of Data Reviewed Radiology: ordered.   This patient presents to the ED for concern of fall, this involves an extensive number of treatment options, and is a complaint that carries with it a high risk of complications and morbidity.  I considered the following differential and admission for this acute, potentially life threatening condition.  The differential diagnosis includes injury including head injury, long bone injury  MDM:    This is a 67 year old male who presents with left shoulder pain after fall.  He is generally poor historian but he is nontoxic and vital signs are reassuring.  States that his shoulder pain is gotten better.  No obvious injury on exam and has good range of motion.  X-rays  reviewed from triage and are negative of the shoulder.  There is some questionable irregularity on elbow films but his physical exam is not consistent with acute injury and he has normal range of motion.  CT head does not show any evidence of acute traumatic injury.  Patient ambulated at his baseline with a [place].  I observed this and he was able to bear weight on both arms without pain.  Discharged home to his family.  (Labs, imaging, consults)  Labs: I Ordered, and personally interpreted labs.  The pertinent results include: None  Imaging Studies ordered: I ordered imaging studies including CT head, x-rays I independently visualized and interpreted imaging. I agree with the radiologist interpretation  Additional history obtained from chart review.  External records from outside source obtained and reviewed including prior evaluations  Cardiac Monitoring: The patient was not maintained on a cardiac monitor.  If on the cardiac monitor, I personally viewed and interpreted the cardiac monitored which showed an underlying rhythm of: N/A  Reevaluation: After the interventions noted above, I reevaluated the patient and found that they have :stayed the same  Social Determinants of Health:  lives with family  Disposition: Discharge  Co morbidities that complicate the patient evaluation  Past Medical History:  Diagnosis Date   COPD (chronic obstructive pulmonary disease) (HCC)    Depression    Enlarged prostate    Gout    Hypertension    Metabolic acidosis 07/19/2020   Seizures (HCC)    Stroke (HCC)      Medicines No orders of the defined types were placed in this encounter.   I have reviewed the patients home medicines and have made adjustments as needed  Problem List / ED Course: Problem List Items Addressed This Visit   None Visit Diagnoses     Fall, initial encounter    -  Primary   Acute pain of left shoulder                       Final Clinical  Impression(s) / ED Diagnoses Final diagnoses:  Fall, initial encounter  Acute pain of left shoulder    Rx / DC Orders ED Discharge Orders     None         Sherryll Skoczylas, Mayer Masker, MD 07/08/23  0549  

## 2023-08-14 NOTE — Telephone Encounter (Signed)
Error

## 2023-08-27 ENCOUNTER — Other Ambulatory Visit: Payer: Self-pay

## 2023-08-27 ENCOUNTER — Emergency Department (HOSPITAL_BASED_OUTPATIENT_CLINIC_OR_DEPARTMENT_OTHER): Payer: Medicare Other | Admitting: Radiology

## 2023-08-27 ENCOUNTER — Encounter (HOSPITAL_BASED_OUTPATIENT_CLINIC_OR_DEPARTMENT_OTHER): Payer: Self-pay

## 2023-08-27 ENCOUNTER — Emergency Department (HOSPITAL_BASED_OUTPATIENT_CLINIC_OR_DEPARTMENT_OTHER)
Admission: EM | Admit: 2023-08-27 | Discharge: 2023-08-27 | Disposition: A | Discharge status: Home / Self Care | Payer: Medicare Other | Attending: Emergency Medicine | Admitting: Emergency Medicine

## 2023-08-27 DIAGNOSIS — S63105A Unspecified dislocation of left thumb, initial encounter: Secondary | ICD-10-CM | POA: Diagnosis not present

## 2023-08-27 DIAGNOSIS — Z7982 Long term (current) use of aspirin: Secondary | ICD-10-CM | POA: Diagnosis not present

## 2023-08-27 DIAGNOSIS — W231XXA Caught, crushed, jammed, or pinched between stationary objects, initial encounter: Secondary | ICD-10-CM | POA: Diagnosis not present

## 2023-08-27 DIAGNOSIS — S6992XA Unspecified injury of left wrist, hand and finger(s), initial encounter: Secondary | ICD-10-CM | POA: Diagnosis present

## 2023-08-27 LAB — CBC
HCT: 44 % (ref 39.0–52.0)
Hemoglobin: 14.5 g/dL (ref 13.0–17.0)
MCH: 29.2 pg (ref 26.0–34.0)
MCHC: 33 g/dL (ref 30.0–36.0)
MCV: 88.7 fL (ref 80.0–100.0)
Platelets: 215 10*3/uL (ref 150–400)
RBC: 4.96 MIL/uL (ref 4.22–5.81)
RDW: 14.9 % (ref 11.5–15.5)
WBC: 6.1 10*3/uL (ref 4.0–10.5)
nRBC: 0 % (ref 0.0–0.2)

## 2023-08-27 LAB — BASIC METABOLIC PANEL
Anion gap: 8 (ref 5–15)
BUN: 17 mg/dL (ref 8–23)
CO2: 21 mmol/L — ABNORMAL LOW (ref 22–32)
Calcium: 9.2 mg/dL (ref 8.9–10.3)
Chloride: 106 mmol/L (ref 98–111)
Creatinine, Ser: 0.98 mg/dL (ref 0.61–1.24)
GFR, Estimated: >60 mL/min (ref >60)
Glucose, Bld: 102 mg/dL — ABNORMAL HIGH (ref 70–99)
Potassium: 4.5 mmol/L (ref 3.5–5.1)
Sodium: 135 mmol/L (ref 135–145)

## 2023-08-27 MED ORDER — LIDOCAINE-EPINEPHRINE (PF) 2 %-1:200000 IJ SOLN
10.0000 mL | Freq: Once | INTRAMUSCULAR | Status: AC
  Administered 2023-08-27: 10 mL via INTRADERMAL
  Filled 2023-08-27: qty 20

## 2023-08-27 NOTE — ED Provider Notes (Signed)
[place] EMERGENCY DEPARTMENT AT [facility] Provider Note   CSN: [id number] Arrival date & time: 08/27/23  1327     History  Chief Complaint  Patient presents with   Finger Injury    Seth [name] is a 67 y.o. male.  67 yo M with a chief complaints of left thumb pain.  He initially said this happened about a week ago and then later when asked he said it has been probably over a month.  He had been seen at some point but said an x-ray has never been done.  Continues to bother him and decided to have it looked at today.        Home Medications Prior to Admission medications   Medication Sig Start Date End Date Taking? Authorizing Provider  acetaminophen (TYLENOL) 325 MG tablet Take 2 tablets (650 mg total) by mouth every 6 (six) hours as needed for headache or mild pain. 10/18/21   Almon Hercules, MD  amLODipine (NORVASC) 10 MG tablet Take 1 tablet (10 mg total) by mouth daily. 06/12/23   Leroy Sea, MD  aspirin EC 81 MG tablet Take 1 tablet (81 mg total) by mouth daily. Swallow whole. 06/12/23   Leroy Sea, MD  atorvastatin (LIPITOR) 40 MG tablet Take 40 mg by mouth every evening. 05/22/23   [provider]  cholecalciferol (VITAMIN D3) 25 MCG (1000 UNIT) tablet Take 1,000 Units by mouth daily. 05/22/23   [provider]  clonazePAM (KLONOPIN) 1 MG tablet Take 1 tablet (1 mg total) by mouth 2 (two) times daily. 06/13/23   Windell Norfolk, MD  folic acid (FOLVITE) 1 MG tablet Take 1 tablet (1 mg total) by mouth daily. 06/12/23   Leroy Sea, MD  gabapentin (NEURONTIN) 300 MG capsule Take 1 capsule (300 mg total) by mouth 3 (three) times daily. 06/13/23 06/07/24  Windell Norfolk, MD  Lacosamide 100 MG TABS Take 1 tablet (100 mg total) by mouth 2 (two) times daily. 06/13/23 06/07/24  Windell Norfolk, MD  levETIRAcetam (KEPPRA) 1000 MG tablet Take 2 tablets (2,000 mg total) by mouth 2 (two) times daily. 06/13/23 06/07/24  Windell Norfolk, MD   metoprolol tartrate (LOPRESSOR) 25 MG tablet Take 0.5 tablets (12.5 mg total) by mouth 2 (two) times daily. 12/24/22   Rhetta Mura, MD  nicotine (NICODERM CQ - DOSED IN MG/24 HOURS) 21 mg/24hr patch Place 1 patch (21 mg total) onto the skin daily. 06/12/23   Leroy Sea, MD  omeprazole (PRILOSEC) 40 MG capsule Take 40 mg by mouth daily. 10/09/22   [provider]  PHENobarbital (LUMINAL) 64.8 MG tablet Take 1 tablet (64.8 mg total) by mouth 2 (two) times daily. 06/13/23 06/07/24  Windell Norfolk, MD  thiamine (VITAMIN B1) 100 MG tablet Take 1 tablet (100 mg total) by mouth daily. 06/12/23   Leroy Sea, MD  Thiamine HCl (B-1) 100 MG TABS Take 1 tablet by mouth every morning. 05/22/23   [provider]      Allergies    Patient has no known allergies.    Review of Systems   Review of Systems  Physical Exam Updated Vital Signs BP 130/89   Pulse 83   Temp 98.4 F (36.9 C) (Oral)   Resp 16   SpO2 96%  Physical Exam Vitals and nursing note reviewed.  Constitutional:      Appearance: He is well-developed.  HENT:     Head: Normocephalic and atraumatic.  Eyes:  Pupils: Pupils are equal, round, and reactive to light.  Neck:     Vascular: No JVD.  Cardiovascular:     Rate and Rhythm: Normal rate and regular rhythm.     Heart sounds: No murmur heard.    No friction rub. No gallop.  Pulmonary:     Effort: No respiratory distress.     Breath sounds: No wheezing.  Abdominal:     General: There is no distension.     Tenderness: There is no abdominal tenderness. There is no guarding or rebound.  Musculoskeletal:        General: Normal range of motion.     Cervical back: Normal range of motion and neck supple.     Comments: Patient has deformity to the interphalangeal joints of the left thumb.  There is a lesion to the palmar aspect of the thumb that is keratonotic.   Skin:    Coloration: Skin is not pale.     Findings: No rash.  Neurological:      Mental Status: He is alert and oriented to person, place, and time.  Psychiatric:        Behavior: Behavior normal.     ED Results / Procedures / Treatments   Labs (all labs ordered are listed, but only abnormal results are displayed) Labs Reviewed  BASIC METABOLIC PANEL - Abnormal; Notable for the following components:      Result Value   CO2 21 (*)    Glucose, Bld 102 (*)    All other components within normal limits  CBC    EKG None  Radiology No results found.  Procedures Procedures    Medications Ordered in ED Medications  lidocaine-EPINEPHrine (XYLOCAINE W/EPI) 2 %-1:200000 (PF) injection 10 mL (has no administration in time range)    ED Course/ Medical Decision Making/ A&P                                 Medical Decision Making Amount and/or Complexity of Data Reviewed Labs: ordered. Radiology: ordered.  Risk Prescription drug management.   67 yo M with a chief complaints of left thumb pain and deformity.  This has been going on for over a month.  Plain film independently interpreted by me with the dislocation.  I attempted reduction at bedside without improvement.  With such a prolonged dislocation I felt to be best served seeing hand surgery in the office.  Splinted in place.  Given follow-up.  2:59 PM:  I have discussed the diagnosis/risks/treatment options with the patient.  Evaluation and diagnostic testing in the emergency department does not suggest an emergent condition requiring admission or immediate intervention beyond what has been performed at this time.  They will follow up with PCP. We also discussed returning to the ED immediately if new or worsening sx occur. We discussed the sx which are most concerning (e.g., sudden worsening pain, fever, inability to tolerate by mouth) that necessitate immediate return. Medications administered to the patient during their visit and any new prescriptions provided to the patient are listed  below.  Medications given during this visit Medications  lidocaine-EPINEPHrine (XYLOCAINE W/EPI) 2 %-1:200000 (PF) injection 10 mL (has no administration in time range)     The patient appears reasonably screen and/or stabilized for discharge and I doubt any other medical condition or other [facility] requiring further screening, evaluation, or treatment in the ED at this time prior to discharge.  Final Clinical Impression(s) / ED Diagnoses Final diagnoses:  Thumb dislocation, left, initial encounter    Rx / DC Orders ED Discharge Orders     None         Melene Plan, DO 08/27/23 1459

## 2023-08-27 NOTE — ED Triage Notes (Signed)
Fall over 1 week ago. Jammed left thumb. Appears deformed. Afebrile. Has mass on skin-pad of thumb. No discharge. Uses [facility].

## 2023-08-27 NOTE — Discharge Instructions (Signed)
Your thumb is dislocated.  I placed you in a splint for comfort.  Please follow-up with a hand surgeon in the office.

## 2023-09-05 ENCOUNTER — Institutional Professional Consult (permissible substitution): Payer: Medicare HMO | Admitting: Neurology

## 2023-10-09 ENCOUNTER — Emergency Department (HOSPITAL_COMMUNITY): Payer: Medicare Other

## 2023-10-09 ENCOUNTER — Emergency Department (HOSPITAL_COMMUNITY)
Admission: EM | Admit: 2023-10-09 | Discharge: 2023-10-09 | Disposition: A | Discharge status: Home / Self Care | Payer: Medicare Other | Attending: Emergency Medicine | Admitting: Emergency Medicine

## 2023-10-09 ENCOUNTER — Encounter (HOSPITAL_COMMUNITY): Payer: Self-pay

## 2023-10-09 ENCOUNTER — Other Ambulatory Visit: Payer: Self-pay

## 2023-10-09 DIAGNOSIS — M25521 Pain in right elbow: Secondary | ICD-10-CM | POA: Insufficient documentation

## 2023-10-09 DIAGNOSIS — R2231 Localized swelling, mass and lump, right upper limb: Secondary | ICD-10-CM | POA: Insufficient documentation

## 2023-10-09 DIAGNOSIS — S61419A Laceration without foreign body of unspecified hand, initial encounter: Secondary | ICD-10-CM | POA: Diagnosis not present

## 2023-10-09 DIAGNOSIS — Z8673 Personal history of transient ischemic attack (TIA), and cerebral infarction without residual deficits: Secondary | ICD-10-CM | POA: Insufficient documentation

## 2023-10-09 DIAGNOSIS — M25522 Pain in left elbow: Secondary | ICD-10-CM | POA: Diagnosis not present

## 2023-10-09 DIAGNOSIS — M25421 Effusion, right elbow: Secondary | ICD-10-CM

## 2023-10-09 DIAGNOSIS — Z7982 Long term (current) use of aspirin: Secondary | ICD-10-CM | POA: Insufficient documentation

## 2023-10-09 DIAGNOSIS — S0181XA Laceration without foreign body of other part of head, initial encounter: Secondary | ICD-10-CM | POA: Diagnosis not present

## 2023-10-09 DIAGNOSIS — J449 Chronic obstructive pulmonary disease, unspecified: Secondary | ICD-10-CM | POA: Diagnosis not present

## 2023-10-09 DIAGNOSIS — Z79899 Other long term (current) drug therapy: Secondary | ICD-10-CM | POA: Diagnosis not present

## 2023-10-09 DIAGNOSIS — W01198A Fall on same level from slipping, tripping and stumbling with subsequent striking against other object, initial encounter: Secondary | ICD-10-CM | POA: Diagnosis not present

## 2023-10-09 DIAGNOSIS — Y92009 Unspecified place in unspecified non-institutional (private) residence as the place of occurrence of the external cause: Secondary | ICD-10-CM | POA: Diagnosis not present

## 2023-10-09 DIAGNOSIS — I10 Essential (primary) hypertension: Secondary | ICD-10-CM | POA: Insufficient documentation

## 2023-10-09 DIAGNOSIS — R519 Headache, unspecified: Secondary | ICD-10-CM | POA: Diagnosis present

## 2023-10-09 DIAGNOSIS — S0083XA Contusion of other part of head, initial encounter: Secondary | ICD-10-CM

## 2023-10-09 NOTE — Progress Notes (Signed)
Orthopedic Tech Progress Note Patient Details:  JAJUAN SKOOG February 24, 1956 [id number]  Ortho Devices Type of Ortho Device: Long arm splint, Arm sling Ortho Device/Splint Location: RUE Ortho Device/Splint Interventions: Ordered, Adjustment, Application   Post Interventions Patient Tolerated: Well Instructions Provided: Care of device  Saleen Peden A Alyx Gee 10/09/2023, 1:09 PM

## 2023-10-09 NOTE — ED Notes (Signed)
Patient verbalizes understanding of discharge instructions. Opportunity for questioning and answers were provided. Armband removed by staff, pt discharged from ED. Pt taken to ED entrance via wheel chair.

## 2023-10-09 NOTE — Progress Notes (Signed)
Orthopedic Tech Progress Note Patient Details:  JOEDY EICKHOFF 1956-08-10 [id number]  Ortho Devices Type of Ortho Device: Ace wrap, Cotton web roll, Arm sling, Post (long arm) splint Ortho Device/Splint Location: RUE Ortho Device/Splint Interventions: Ordered, Application, Adjustment   Post Interventions Patient Tolerated: Well Instructions Provided: Care of device  Donald Pore 10/09/2023, 12:39 PM

## 2023-10-09 NOTE — ED Triage Notes (Signed)
Pt BIB [facility] from home where he lives with his sister & when attempting to get OOB without his Rolon to ambulate to the restroom he fell onto the carpet, hitting his forehead on the corner of the door, landing on his Lt side. EMS reports he does have a small lac to his forehead that required no bleeding control, did stop bleeding before their arrival. A/Ox3, confused as baseline, sister endorses new diagnosis of dementia. Hx of stroke with baseline Rt sided def, does c/o head & neck pain, no c-collar in place upon arrival. Pt endorses generalized pain, not on thinners & denies LOC.

## 2023-10-09 NOTE — ED Notes (Signed)
Pts family has been contacted after several phone calls & his daughter Pryor Montes states she will try to get off work & may not be able to get him until around 4pm.

## 2023-10-09 NOTE — ED Notes (Signed)
Patient transported to X-ray

## 2023-10-09 NOTE — Discharge Instructions (Addendum)
Return if any problems.  See the Orthopaedist for recheck and repeat xray in 1 week

## 2023-10-09 NOTE — ED Provider Notes (Signed)
Seth [name] Provider Note   CSN: [id number] Arrival date & time: 10/09/23  7829     History  No chief complaint on file.   Seth [name] is a 68 y.o. male.  Patient reports he slipped and fell at home.  Patient states that he was trying to go to the bathroom.  EMS reports that patient hit his head on the corner of the door.  Patient does not think that he lost consciousness.  He currently denies any complaints.  Patient states that he walks with a Bearce.  Patient reports that he has had a previous stroke.  EMS reports patient did not have a loss of consciousness.  Patient has a past medical history of hypertension alcohol abuse hyperglycemia acute kidney disease.  COPD.  The history is provided by the patient. No language interpreter was used.       Home Medications Prior to Admission medications   Medication Sig Start Date End Date Taking? Authorizing Provider  acetaminophen (TYLENOL) 325 MG tablet Take 2 tablets (650 mg total) by mouth every 6 (six) hours as needed for headache or mild pain. 10/18/21   Almon Hercules, MD  amLODipine (NORVASC) 10 MG tablet Take 1 tablet (10 mg total) by mouth daily. 06/12/23   Leroy Sea, MD  aspirin EC 81 MG tablet Take 1 tablet (81 mg total) by mouth daily. Swallow whole. 06/12/23   Leroy Sea, MD  atorvastatin (LIPITOR) 40 MG tablet Take 40 mg by mouth every evening. 05/22/23   [provider]  carvedilol (COREG) 6.25 MG tablet Take 6.25 mg by mouth in the morning and at bedtime. 09/19/23   [provider]  cholecalciferol (VITAMIN D3) 25 MCG (1000 UNIT) tablet Take 1,000 Units by mouth daily. 05/22/23   [provider]  Cholecalciferol (VITAMIN D3) 250 MCG (10000 UT) capsule Take 10,000 Units by mouth daily. 09/19/23   [provider]  clonazePAM (KLONOPIN) 1 MG tablet Take 1 tablet (1 mg total) by mouth 2 (two) times daily. 06/13/23   Windell Norfolk, MD   folic acid (FOLVITE) 1 MG tablet Take 1 tablet (1 mg total) by mouth daily. 06/12/23   Leroy Sea, MD  gabapentin (NEURONTIN) 300 MG capsule Take 1 capsule (300 mg total) by mouth 3 (three) times daily. 06/13/23 06/07/24  Windell Norfolk, MD  Lacosamide 100 MG TABS Take 1 tablet (100 mg total) by mouth 2 (two) times daily. 06/13/23 06/07/24  Windell Norfolk, MD  levETIRAcetam (KEPPRA) 1000 MG tablet Take 2 tablets (2,000 mg total) by mouth 2 (two) times daily. 06/13/23 06/07/24  Windell Norfolk, MD  metoprolol tartrate (LOPRESSOR) 25 MG tablet Take 0.5 tablets (12.5 mg total) by mouth 2 (two) times daily. 12/24/22   Rhetta Mura, MD  nicotine (NICODERM CQ - DOSED IN MG/24 HOURS) 21 mg/24hr patch Place 1 patch (21 mg total) onto the skin daily. 06/12/23   Leroy Sea, MD  omeprazole (PRILOSEC) 40 MG capsule Take 40 mg by mouth daily. 10/09/22   [provider]  PHENobarbital (LUMINAL) 64.8 MG tablet Take 1 tablet (64.8 mg total) by mouth 2 (two) times daily. 06/13/23 06/07/24  Windell Norfolk, MD  thiamine (VITAMIN B1) 100 MG tablet Take 1 tablet (100 mg total) by mouth daily. 06/12/23   Leroy Sea, MD  Thiamine HCl (B-1) 100 MG TABS Take 1 tablet by mouth every morning. 05/22/23   [provider]  Allergies    Patient has no known allergies.    Review of Systems   Review of Systems  All other systems reviewed and are negative.   Physical Exam Updated Vital Signs BP (!) 155/88 (BP Location: Left Arm)   Pulse 70   Temp 98.7 F (37.1 C) (Oral)   Resp 17   Ht 6\' 3"  (1.905 m)   Wt 81.6 kg   SpO2 100%   BMI 22.50 kg/m  Physical Exam Vitals and nursing note reviewed.  Constitutional:      Appearance: He is well-developed.  HENT:     Head: Normocephalic.     Comments: 1 cm abrasion forehead, no gaping wound cleaned and bandaged Cardiovascular:     Rate and Rhythm: Normal rate.  Pulmonary:     Effort: Pulmonary effort is normal.   Abdominal:     General: There is no distension.  Musculoskeletal:        General: Tenderness present.     Comments: Tender right elbow,, tender left elbow.  No obvious deformities.  Skin:    General: Skin is warm.  Neurological:     General: No focal deficit present.     Mental Status: He is alert and oriented to person, place, and time.     ED Results / Procedures / Treatments   Labs (all labs ordered are listed, but only abnormal results are displayed) Labs Reviewed - No data to display  EKG None  Radiology DG ELBOW COMPLETE RIGHT (3+VIEW) Result Date: 10/09/2023 CLINICAL DATA:  Fall at home. EXAM: RIGHT ELBOW - COMPLETE 3+ VIEW COMPARISON:  None Available. FINDINGS: There is diffuse decreased bone mineralization. There is elevation of the distal humeral fat pad, likely a small joint effusion. Mild soft tissue swelling posterior to the olecranon. Minimal chronic enthesopathic change at the triceps insertion on the olecranon. No definite acute fracture line is seen. However, on oblique view question minimal asymmetry of the radial head-neck junction. This may be positional. IMPRESSION: Small joint effusion. No definite acute fracture line is seen. However, on oblique view question minimal asymmetry of the radial head-neck junction. This may be positional. Recommend clinical correlation for point tenderness in this region. Electronically Signed   By: Neita Garnet M.D.   On: 10/09/2023 11:26   DG Elbow Complete Left Result Date: 10/09/2023 CLINICAL DATA:  Fall at home. EXAM: LEFT ELBOW - COMPLETE 3+ VIEW COMPARISON:  Left elbow radiographs 07/07/2023 FINDINGS: There is diffuse decreased bone mineralization. Note is made on the prior radiographs there was a curvilinear lucency suggesting a fracture at the lateral aspect of the radial head. This is less visualized on the current study and appears at least partially healed. No displacement. Minimal chronic enthesopathic change at the triceps  insertion on the olecranon. Mild degenerative spurring at the volar medial aspect of the coronoid process. No definite acute fracture is seen. No dislocation. No joint effusion. IMPRESSION: The curvilinear lucency suggesting a fracture at the lateral aspect of the radial head on the prior radiographs is less visualized on the current study and appears at least partially healed. No displacement. No acute fracture is seen. Electronically Signed   By: Neita Garnet M.D.   On: 10/09/2023 11:24   CT Head Wo Contrast Result Date: 10/09/2023 CLINICAL DATA:  Head trauma, moderate-severe; Neck trauma (Age >= 65y) EXAM: CT HEAD WITHOUT CONTRAST CT CERVICAL SPINE WITHOUT CONTRAST TECHNIQUE: Multidetector CT imaging of the head and cervical spine was performed following the standard protocol without  intravenous contrast. Multiplanar CT image reconstructions of the cervical spine were also generated. RADIATION DOSE REDUCTION: This exam was performed according to the departmental dose-optimization program which includes automated exposure control, adjustment of the mA and/or kV according to patient size and/or use of iterative reconstruction technique. COMPARISON:  CTA head and neck 06/09/2023, CT Head 07/08/23, MR head 06/09/23 FINDINGS: CT HEAD FINDINGS Brain: No hemorrhage. No hydrocephalus. No extra-axial fluid collection. No mass effect. No mass lesion. Redemonstrated disproportionate enlargement of the left lateral ventricular system, particularly at the level of the temporal horn. The degree of ventricular enlargement is disproportionate to the degree of volume loss around the left temporal/insular region. This is unchanged compared to 07/08/2023. Chronic infarcts in the bilateral cerebellar hemispheres. Generalized volume loss. Unchanged periventricular hypodensity. Unchanged 7 mm meningioma along the anterior falx. Vascular: No hyperdense vessel or unexpected calcification. Skull: Soft tissue hematoma along the  midline frontal scalp. No evidence of an underlying calvarial fracture. Sinuses/Orbits: No middle ear or mastoid effusion. Mucosal thickening in the bilateral frontal, bilateral ethmoid, bilateral maxillary, in the right sphenoid sinus. Other: None CT CERVICAL SPINE FINDINGS Alignment: Grade 1 anterolisthesis of C4 on C5 Skull base and vertebrae: No acute fracture. No primary bone lesion or focal pathologic process. Status post C3-C4 ACDF. Unchanged chronic fracture through the inferior facet of C2 on the right. Soft tissues and spinal canal: No prevertebral fluid or swelling. No visible canal hematoma. Disc levels:  No evidence of high-grade spinal canal stenosis Upper chest: Negative. Other: None IMPRESSION: 1. No CT evidence of intracranial injury. 2. Soft tissue hematoma along the midline frontal scalp. No evidence of an underlying calvarial fracture. 3. No acute fracture or traumatic subluxation of the cervical spine. 4. Unchanged size and shape of the ventricular system. Electronically Signed   By: Lorenza Cambridge M.D.   On: 10/09/2023 10:12   CT Cervical Spine Wo Contrast Result Date: 10/09/2023 CLINICAL DATA:  Head trauma, moderate-severe; Neck trauma (Age >= 65y) EXAM: CT HEAD WITHOUT CONTRAST CT CERVICAL SPINE WITHOUT CONTRAST TECHNIQUE: Multidetector CT imaging of the head and cervical spine was performed following the standard protocol without intravenous contrast. Multiplanar CT image reconstructions of the cervical spine were also generated. RADIATION DOSE REDUCTION: This exam was performed according to the departmental dose-optimization program which includes automated exposure control, adjustment of the mA and/or kV according to patient size and/or use of iterative reconstruction technique. COMPARISON:  CTA head and neck 06/09/2023, CT Head 07/08/23, MR head 06/09/23 FINDINGS: CT HEAD FINDINGS Brain: No hemorrhage. No hydrocephalus. No extra-axial fluid collection. No mass effect. No mass lesion.  Redemonstrated disproportionate enlargement of the left lateral ventricular system, particularly at the level of the temporal horn. The degree of ventricular enlargement is disproportionate to the degree of volume loss around the left temporal/insular region. This is unchanged compared to 07/08/2023. Chronic infarcts in the bilateral cerebellar hemispheres. Generalized volume loss. Unchanged periventricular hypodensity. Unchanged 7 mm meningioma along the anterior falx. Vascular: No hyperdense vessel or unexpected calcification. Skull: Soft tissue hematoma along the midline frontal scalp. No evidence of an underlying calvarial fracture. Sinuses/Orbits: No middle ear or mastoid effusion. Mucosal thickening in the bilateral frontal, bilateral ethmoid, bilateral maxillary, in the right sphenoid sinus. Other: None CT CERVICAL SPINE FINDINGS Alignment: Grade 1 anterolisthesis of C4 on C5 Skull base and vertebrae: No acute fracture. No primary bone lesion or focal pathologic process. Status post C3-C4 ACDF. Unchanged chronic fracture through the inferior facet of C2 on the right. Soft  tissues and spinal canal: No prevertebral fluid or swelling. No visible canal hematoma. Disc levels:  No evidence of high-grade spinal canal stenosis Upper chest: Negative. Other: None IMPRESSION: 1. No CT evidence of intracranial injury. 2. Soft tissue hematoma along the midline frontal scalp. No evidence of an underlying calvarial fracture. 3. No acute fracture or traumatic subluxation of the cervical spine. 4. Unchanged size and shape of the ventricular system. Electronically Signed   By: Lorenza Cambridge M.D.   On: 10/09/2023 10:12    Procedures Procedures    Medications Ordered in ED Medications - No data to display  ED Course/ Medical Decision Making/ A&P                                 Medical Decision Making MDM patient reports losing his balance and falling at home.  Patient struck the door.  Patient complains of a cut  on his hand he has soreness in both elbows.  Amount and/or Complexity of Data Reviewed Independent Historian: EMS    Details: History is provide by EMS Radiology: ordered and independent interpretation performed. Decision-making details documented in ED Course.    Details: CT head and CT cervical spine show no acute abnormality X-ray left elbow shows no acute injury X-ray right elbow shows effusion,            Final Clinical Impression(s) / ED Diagnoses Final diagnoses:  Laceration of forehead, initial encounter  Contusion of forehead, initial encounter  Effusion of bursa of right elbow    Rx / DC Orders ED Discharge Orders     None      Patient placed in a splint to his right elbow.  He is advised that he will need to follow-up with the orthopedist for repeat x-ray in 1 week.   Osie Cheeks 10/09/23 1527    Benjiman Core, MD 10/10/23 [phone]

## 2023-10-09 NOTE — ED Notes (Signed)
Pt found to be wet, brief changed and pt changed into paper scrubs. Contacted daughter, states that she will be on the way soon unable to provide ETA at this time.

## 2023-10-17 ENCOUNTER — Ambulatory Visit: Payer: Medicare Other | Admitting: Family

## 2023-10-24 ENCOUNTER — Ambulatory Visit: Payer: Medicare Other | Admitting: Family

## 2023-10-31 ENCOUNTER — Encounter: Payer: Self-pay | Admitting: Family

## 2023-10-31 ENCOUNTER — Ambulatory Visit: Payer: Medicare Other | Admitting: Family

## 2023-10-31 DIAGNOSIS — S63105D Unspecified dislocation of left thumb, subsequent encounter: Secondary | ICD-10-CM

## 2023-10-31 DIAGNOSIS — S59901A Unspecified injury of right elbow, initial encounter: Secondary | ICD-10-CM
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
## 2023-11-21 ENCOUNTER — Encounter (HOSPITAL_COMMUNITY): Payer: Self-pay

## 2023-11-21 ENCOUNTER — Emergency Department (HOSPITAL_COMMUNITY)

## 2023-11-21 ENCOUNTER — Inpatient Hospital Stay (HOSPITAL_COMMUNITY)
Admission: EM | Admit: 2023-11-21 | Discharge: 2023-11-26 | DRG: 947 | Disposition: A | Discharge status: Skilled Nursing Facility | Attending: Family Medicine | Admitting: Family Medicine

## 2023-11-21 ENCOUNTER — Other Ambulatory Visit: Payer: Self-pay

## 2023-11-21 DIAGNOSIS — R5381 Other malaise: Secondary | ICD-10-CM | POA: Diagnosis not present

## 2023-11-21 DIAGNOSIS — E785 Hyperlipidemia, unspecified: Secondary | ICD-10-CM | POA: Diagnosis present

## 2023-11-21 DIAGNOSIS — Z818 Family history of other mental and behavioral disorders: Secondary | ICD-10-CM

## 2023-11-21 DIAGNOSIS — F1090 Alcohol use, unspecified, uncomplicated: Secondary | ICD-10-CM | POA: Diagnosis not present

## 2023-11-21 DIAGNOSIS — Z7409 Other reduced mobility: Secondary | ICD-10-CM | POA: Diagnosis present

## 2023-11-21 DIAGNOSIS — R531 Weakness: Secondary | ICD-10-CM | POA: Diagnosis not present

## 2023-11-21 DIAGNOSIS — F32A Depression, unspecified: Secondary | ICD-10-CM | POA: Diagnosis present

## 2023-11-21 DIAGNOSIS — Z8249 Family history of ischemic heart disease and other diseases of the circulatory system: Secondary | ICD-10-CM

## 2023-11-21 DIAGNOSIS — F17210 Nicotine dependence, cigarettes, uncomplicated: Secondary | ICD-10-CM | POA: Diagnosis present

## 2023-11-21 DIAGNOSIS — R4781 Slurred speech: Principal | ICD-10-CM

## 2023-11-21 DIAGNOSIS — I6522 Occlusion and stenosis of left carotid artery: Secondary | ICD-10-CM | POA: Diagnosis present

## 2023-11-21 DIAGNOSIS — G40909 Epilepsy, unspecified, not intractable, without status epilepticus: Secondary | ICD-10-CM | POA: Diagnosis present

## 2023-11-21 DIAGNOSIS — I671 Cerebral aneurysm, nonruptured: Secondary | ICD-10-CM | POA: Diagnosis present

## 2023-11-21 DIAGNOSIS — Z89432 Acquired absence of left foot: Secondary | ICD-10-CM

## 2023-11-21 DIAGNOSIS — Z981 Arthrodesis status: Secondary | ICD-10-CM

## 2023-11-21 DIAGNOSIS — I10 Essential (primary) hypertension: Secondary | ICD-10-CM | POA: Diagnosis present

## 2023-11-21 DIAGNOSIS — F419 Anxiety disorder, unspecified: Secondary | ICD-10-CM | POA: Diagnosis present

## 2023-11-21 DIAGNOSIS — N400 Enlarged prostate without lower urinary tract symptoms: Secondary | ICD-10-CM | POA: Diagnosis present

## 2023-11-21 DIAGNOSIS — Z825 Family history of asthma and other chronic lower respiratory diseases: Secondary | ICD-10-CM

## 2023-11-21 DIAGNOSIS — G9341 Metabolic encephalopathy: Secondary | ICD-10-CM | POA: Diagnosis present

## 2023-11-21 DIAGNOSIS — M109 Gout, unspecified: Secondary | ICD-10-CM | POA: Diagnosis present

## 2023-11-21 DIAGNOSIS — R29706 NIHSS score 6: Secondary | ICD-10-CM | POA: Diagnosis not present

## 2023-11-21 DIAGNOSIS — J449 Chronic obstructive pulmonary disease, unspecified: Secondary | ICD-10-CM | POA: Diagnosis present

## 2023-11-21 DIAGNOSIS — Z79899 Other long term (current) drug therapy: Secondary | ICD-10-CM

## 2023-11-21 DIAGNOSIS — Z7982 Long term (current) use of aspirin: Secondary | ICD-10-CM

## 2023-11-21 DIAGNOSIS — R471 Dysarthria and anarthria: Secondary | ICD-10-CM | POA: Diagnosis present

## 2023-11-21 DIAGNOSIS — G629 Polyneuropathy, unspecified: Secondary | ICD-10-CM | POA: Diagnosis present

## 2023-11-21 DIAGNOSIS — W010XXA Fall on same level from slipping, tripping and stumbling without subsequent striking against object, initial encounter: Secondary | ICD-10-CM | POA: Diagnosis present

## 2023-11-21 DIAGNOSIS — I69351 Hemiplegia and hemiparesis following cerebral infarction affecting right dominant side: Secondary | ICD-10-CM

## 2023-11-21 LAB — COMPREHENSIVE METABOLIC PANEL
ALT: 23 U/L (ref 0–44)
AST: 27 U/L (ref 15–41)
Albumin: 3.7 g/dL (ref 3.5–5.0)
Alkaline Phosphatase: 166 U/L — ABNORMAL HIGH (ref 38–126)
Anion gap: 11 (ref 5–15)
BUN: 14 mg/dL (ref 8–23)
CO2: 23 mmol/L (ref 22–32)
Calcium: 9.2 mg/dL (ref 8.9–10.3)
Chloride: 105 mmol/L (ref 98–111)
Creatinine, Ser: 1.03 mg/dL (ref 0.61–1.24)
GFR, Estimated: >60 mL/min (ref >60)
Glucose, Bld: 81 mg/dL (ref 70–99)
Potassium: 4.1 mmol/L (ref 3.5–5.1)
Sodium: 139 mmol/L (ref 135–145)
Total Bilirubin: 0.5 mg/dL (ref 0.0–1.2)
Total Protein: 7.4 g/dL (ref 6.5–8.1)

## 2023-11-21 LAB — CBG MONITORING, ED: Glucose-Capillary: 78 mg/dL (ref 70–99)

## 2023-11-21 LAB — I-STAT CHEM 8, ED
BUN: 15 mg/dL (ref 8–23)
Calcium, Ion: 1.16 mmol/L (ref 1.15–1.40)
Chloride: 106 mmol/L (ref 98–111)
Creatinine, Ser: 1 mg/dL (ref 0.61–1.24)
Glucose, Bld: 83 mg/dL (ref 70–99)
HCT: 50 % (ref 39.0–52.0)
Hemoglobin: 17 g/dL (ref 13.0–17.0)
Potassium: 4.3 mmol/L (ref 3.5–5.1)
Sodium: 140 mmol/L (ref 135–145)
TCO2: 24 mmol/L (ref 22–32)

## 2023-11-21 LAB — ETHANOL: Alcohol, Ethyl (B): <10 mg/dL (ref <10)

## 2023-11-21 LAB — PROTIME-INR
INR: 1.1 (ref 0.8–1.2)
Prothrombin Time: 13.9 s (ref 11.4–15.2)

## 2023-11-21 LAB — APTT: aPTT: 35 s (ref 24–36)

## 2023-11-21 MED ORDER — IOHEXOL 350 MG/ML SOLN
75.0000 mL | Freq: Once | INTRAVENOUS | Status: AC | PRN
  Administered 2023-11-21: 75 mL via INTRAVENOUS

## 2023-11-21 NOTE — ED Provider Notes (Signed)
 [place] EMERGENCY DEPARTMENT AT [facility] Provider Note   CSN: [id number] Arrival date & time: 11/21/23  2152     History  Chief Complaint  Patient presents with   Code Stroke    Seth [name] is a 68 y.o. male.  Pt is a 68 yo male with pmhx significant for gout, depression, htn, cva, seizures, and copd.  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  Code stroke called upon arrival to the ED.  LKN at 6 pm.       Home Medications Prior to Admission medications   Medication Sig Start Date End Date Taking? Authorizing Provider  acetaminophen (TYLENOL) 325 MG tablet Take 2 tablets (650 mg total) by mouth every 6 (six) hours as needed for headache or mild pain. 10/18/21   Almon Hercules, MD  amLODipine (NORVASC) 10 MG tablet Take 1 tablet (10 mg total) by mouth daily. 06/12/23   Leroy Sea, MD  aspirin EC 81 MG tablet Take 1 tablet (81 mg total) by mouth daily. Swallow whole. 06/12/23   Leroy Sea, MD  atorvastatin (LIPITOR) 40 MG tablet Take 40 mg by mouth every evening. 05/22/23   [provider]  carvedilol (COREG) 6.25 MG tablet Take 6.25 mg by mouth in the morning and at bedtime. 09/19/23   [provider]  cholecalciferol (VITAMIN D3) 25 MCG (1000 UNIT) tablet Take 1,000 Units by mouth daily. 05/22/23   [provider]  Cholecalciferol (VITAMIN D3) 250 MCG (10000 UT) capsule Take 10,000 Units by mouth daily. 09/19/23   [provider]  clonazePAM (KLONOPIN) 1 MG tablet Take 1 tablet (1 mg total) by mouth 2 (two) times daily. 06/13/23   Windell Norfolk, MD  folic acid (FOLVITE) 1 MG tablet Take 1 tablet (1 mg total) by mouth daily. 06/12/23   Leroy Sea, MD  gabapentin (NEURONTIN) 300 MG capsule Take 1 capsule (300 mg total) by mouth 3 (three) times daily. 06/13/23 06/07/24  Windell Norfolk, MD  Lacosamide 100 MG TABS Take 1 tablet  (100 mg total) by mouth 2 (two) times daily. 06/13/23 06/07/24  Windell Norfolk, MD  levETIRAcetam (KEPPRA) 1000 MG tablet Take 2 tablets (2,000 mg total) by mouth 2 (two) times daily. 06/13/23 06/07/24  Windell Norfolk, MD  metoprolol tartrate (LOPRESSOR) 25 MG tablet Take 0.5 tablets (12.5 mg total) by mouth 2 (two) times daily. 12/24/22   Rhetta Mura, MD  nicotine (NICODERM CQ - DOSED IN MG/24 HOURS) 21 mg/24hr patch Place 1 patch (21 mg total) onto the skin daily. 06/12/23   Leroy Sea, MD  omeprazole (PRILOSEC) 40 MG capsule Take 40 mg by mouth daily. 10/09/22   [provider]  PHENobarbital (LUMINAL) 64.8 MG tablet Take 1 tablet (64.8 mg total) by mouth 2 (two) times daily. 06/13/23 06/07/24  Windell Norfolk, MD  thiamine (VITAMIN B1) 100 MG tablet Take 1 tablet (100 mg total) by mouth daily. 06/12/23   Leroy Sea, MD  Thiamine HCl (B-1) 100 MG TABS Take 1 tablet by mouth every morning. 05/22/23   [provider]      Allergies    Patient has no known allergies.    Review of Systems   Review of Systems  Neurological:  Positive for speech difficulty and weakness.  All other systems reviewed and are negative.   Physical Exam Updated Vital Signs BP (!) 140/89 (BP Location: Right  Arm)   Pulse 70   Temp 98.4 F (36.9 C) (Oral)   Resp 20   Ht 6\' 3"  (1.905 m)   Wt 73.8 kg   SpO2 91%   BMI 20.34 kg/m  Physical Exam Vitals and nursing note reviewed.  Constitutional:      Appearance: Normal appearance.  HENT:     Head: Normocephalic and atraumatic.     Right Ear: External ear normal.     Left Ear: External ear normal.     Nose: Nose normal.     Mouth/Throat:     Mouth: Mucous membranes are moist.     Pharynx: Oropharynx is clear.  Eyes:     Extraocular Movements: Extraocular movements intact.     Conjunctiva/sclera: Conjunctivae normal.     Pupils: Pupils are equal, round, and reactive to light.  Cardiovascular:     Rate and Rhythm:  Normal rate and regular rhythm.     Pulses: Normal pulses.     Heart sounds: Normal heart sounds.  Pulmonary:     Effort: Pulmonary effort is normal.     Breath sounds: Normal breath sounds.  Abdominal:     General: Abdomen is flat. Bowel sounds are normal.     Palpations: Abdomen is soft.  Musculoskeletal:        General: Normal range of motion.     Cervical back: Normal range of motion and neck supple.  Skin:    General: Skin is warm.     Capillary Refill: Capillary refill takes less than 2 seconds.  Neurological:     Mental Status: He is alert and oriented to person, place, and time.     Comments: Slurred speech.  Right sided weakness     ED Results / Procedures / Treatments   Labs (all labs ordered are listed, but only abnormal results are displayed) Labs Reviewed  CBC - Abnormal; Notable for the following components:      Result Value   RDW 15.9 (*)    All other components within normal limits  DIFFERENTIAL - Abnormal; Notable for the following components:   Eosinophils Absolute 0.6 (*)    All other components within normal limits  COMPREHENSIVE METABOLIC PANEL - Abnormal; Notable for the following components:   Alkaline Phosphatase 166 (*)    All other components within normal limits  ETHANOL  PROTIME-INR  APTT  RAPID URINE DRUG SCREEN, HOSP PERFORMED  URINALYSIS, ROUTINE W REFLEX MICROSCOPIC  LEVETIRACETAM LEVEL  LACOSAMIDE  I-STAT CHEM 8, ED  CBG MONITORING, ED    EKG EKG Interpretation Date/Time:  Wednesday November 21 2023 22:35:08 EDT Ventricular Rate:  65 PR Interval:  246 QRS Duration:  81 QT Interval:  397 QTC Calculation: 413 R Axis:   50  Text Interpretation: Sinus rhythm Prolonged PR interval Consider anterior infarct ST elevation suggests acute pericarditis No significant change since last tracing Confirmed by Jacalyn Lefevre [phone]) on 11/21/2023 10:45:03 PM  Radiology CT ANGIO HEAD NECK W WO CM (CODE STROKE) Result Date: 11/21/2023 CLINICAL  DATA:  Neuro deficit, acute, stroke suspected EXAM: CT ANGIOGRAPHY HEAD AND NECK WITH AND WITHOUT CONTRAST TECHNIQUE: Multidetector CT imaging of the head and neck was performed using the standard protocol during bolus administration of intravenous contrast. Multiplanar CT image reconstructions and MIPs were obtained to evaluate the vascular anatomy. Carotid stenosis measurements (when applicable) are obtained utilizing NASCET criteria, using the distal internal carotid diameter as the denominator. RADIATION DOSE REDUCTION: This exam was performed according to the departmental  dose-optimization program which includes automated exposure control, adjustment of the mA and/or kV according to patient size and/or use of iterative reconstruction technique. CONTRAST:  75mL OMNIPAQUE IOHEXOL 350 MG/ML SOLN COMPARISON:  CTA head/neck October 12 24. FINDINGS: CTA NECK FINDINGS Aortic arch: Great vessel origins are patent without significant stenosis. Aortic atherosclerosis. Right carotid system: No evidence of significant stenosis (50% or greater), or occlusion. 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 Left carotid system: Patent. 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 The ICA pseudoaneurysm is more thrombosed than on the prior. Vertebral arteries: Codominant. No evidence of dissection, stenosis (50% or greater), or occlusion. Chronically small. Skeleton: No acute abnormality on limited assessment. Other neck: No acute abnormality on limited assessment. Upper chest: Emphysema. Review of the MIP images confirms the above findings CTA HEAD FINDINGS Anterior circulation: Bilateral intracranial ICAs, MCAs, and bilateral ACAs are patent without proximal hemodynamically significant stenosis. Posterior circulation: Bilateral intradural vertebral arteries, basilar artery and bilateral posterior cerebral arteries are patent  without proximal hemodynamically significant stenosis. 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 Venous sinuses: As permitted by contrast timing, patent. Anatomic variants: Detailed above. Review of the MIP images confirms the above findings IMPRESSION: 1. No emergent large vessel occlusion or proximal hemodynamically significant stenosis intracranially. 2. 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 3. Aortic Atherosclerosis (ICD10-I70.0) and Emphysema (ICD10-J43.9). Electronically Signed   By: Feliberto Harts M.D.   On: 11/21/2023 22:45   CT HEAD CODE STROKE WO CONTRAST Result Date: 11/21/2023 CLINICAL DATA:  Code stroke.  Neuro deficit, acute, stroke suspected EXAM: CT HEAD WITHOUT CONTRAST TECHNIQUE: Contiguous axial images were obtained from the base of the skull through the vertex without intravenous contrast. RADIATION DOSE REDUCTION: This exam was performed according to the departmental dose-optimization program which includes automated exposure control, adjustment of the mA and/or kV according to patient size and/or use of iterative reconstruction technique. COMPARISON:  CT head October 09, 2023. FINDINGS: Brain: Remote cerebellar infarcts. Patchy white matter hypodensities, compatible with chronic microvascular ischemic disease. No evidence of acute large vascular territory infarct, acute hemorrhage or mass lesion. Cerebral atrophy. No hydrocephalus. Vascular: No hyperdense vessel. Skull: No acute fracture. Sinuses/Orbits: Clear sinuses.  No acute orbital findings. Other: No mastoid effusions. ASPECTS [facility] Stroke Program Early CT Score) Total score (0-10 with 10 being normal): 10. IMPRESSION: 1. No evidence of acute intracranial abnormality. 2. ASPECTS is 10. Code stroke imaging results were  communicated on 11/21/2023 at 10:30 pm to provider Dr. Derry Lory Via secure text paging. Electronically Signed   By: Feliberto Harts M.D.   On: 11/21/2023 22:32    Procedures Procedures    Medications Ordered in ED Medications  iohexol (OMNIPAQUE) 350 MG/ML injection 75 mL (75 mLs Intravenous Contrast Given 11/21/23 2221)    ED Course/ Medical Decision Making/ A&P                                 Medical Decision Making Amount and/or Complexity of Data Reviewed Labs: ordered. Radiology: ordered.   This patient presents to the ED for concern of cva, this involves an extensive number of treatment options, and is a complaint that carries with it a high risk of  complications and morbidity.  The differential diagnosis includes cva, tia, etoh intox, seizure   Co morbidities that complicate the patient evaluation  gout, depression, htn, cva, seizures, and copd   Additional history obtained:  Additional history obtained from epic chart review External records from outside source obtained and reviewed including EMS report   Lab Tests:  I Ordered, and personally interpreted labs.  The pertinent results include:  cmp nl, cbc nl (wbc pending at shift change), inr nl, etoh neg   Imaging Studies ordered:  I ordered imaging studies including ct head/cta head/mri  I independently visualized and interpreted imaging which showed  CT head: 1. No evidence of acute intracranial abnormality.  2. ASPECTS is 10.  CTA head: No emergent large vessel occlusion or proximal hemodynamically  significant stenosis intracranially.  2. Similar size of partially thrombosed pseudoaneurysms at the skull  base bilaterally with moderate (approximately 50%) stenosis of the  left ICA. The left ICA pseudoaneurysm is more thrombosed than on the  prior.  3. Aortic Atherosclerosis (ICD10-I70.0) and Emphysema (ICD10-J43.9).   I agree with the radiologist interpretation   Cardiac Monitoring:  The  patient was maintained on a cardiac monitor.  I personally viewed and interpreted the cardiac monitored which showed an underlying rhythm of: nsr   Medicines ordered and prescription drug management:   I have reviewed the patients home medicines and have made adjustments as needed   Test Considered:  Ct/mri   Critical Interventions:  Code stroke   Consultations Obtained:  I requested consultation with the neurologist (Dr. Derry Lory),  and discussed lab and imaging findings as well as pertinent plan - sx are too mild for tnk.  No lvo, so no ir   Problem List / ED Course:  Slurred speech/weakness:  stroke work up in progress.  Pt signed out at shift change.   Reevaluation:  After the interventions noted above, I reevaluated the patient and found that they have :improved   Social Determinants of Health:  Lives at home   Dispostion: Pending at shift change  CRITICAL CARE Performed by: Jacalyn Lefevre   Total critical care time: 30 minutes  Critical care time was exclusive of separately billable procedures and treating other patients.  Critical care was necessary to treat or prevent imminent or life-threatening deterioration.  Critical care was time spent personally by me on the following activities: development of treatment plan with patient and/or surrogate as well as nursing, discussions with consultants, evaluation of patient's response to treatment, examination of patient, obtaining history from patient or surrogate, ordering and performing treatments and interventions, ordering and review of laboratory studies, ordering and review of radiographic studies, pulse oximetry and re-evaluation of patient's condition.         Final Clinical Impression(s) / ED Diagnoses Final diagnoses:  Slurred speech  Weakness    Rx / DC Orders ED Discharge Orders     None         Jacalyn Lefevre, MD 11/22/23 [phone]

## 2023-11-21 NOTE — ED Notes (Signed)
 Code Stroke activated per Dr. Para Skeans

## 2023-11-21 NOTE — ED Triage Notes (Signed)
 PT BIB [facility] from home after PT fell today . LKW was at 6pm.PT was walking and fell and was unable to get up., PT usually has right sided weakness but once PT fell he was unable to use the right side at all to get up.PT is drooling and has slurred speech.

## 2023-11-21 NOTE — ED Provider Notes (Incomplete)
 [place] EMERGENCY DEPARTMENT AT [facility] Provider Note   CSN: [id number] Arrival date & time: 11/21/23  2152     History {Add pertinent medical, surgical, social history, OB history to HPI:1} Chief Complaint  Patient presents with  . Code Stroke    Seth [name] is a 68 y.o. male.  Pt is a 68 yo male with pmhx significant for gout, depression, htn, cva, seizures, and copd.  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  Code stroke called upon arrival to the ED.  LKN at 6 pm.       Home Medications Prior to Admission medications   Medication Sig Start Date End Date Taking? Authorizing Provider  acetaminophen (TYLENOL) 325 MG tablet Take 2 tablets (650 mg total) by mouth every 6 (six) hours as needed for headache or mild pain. 10/18/21   Almon Hercules, MD  amLODipine (NORVASC) 10 MG tablet Take 1 tablet (10 mg total) by mouth daily. 06/12/23   Leroy Sea, MD  aspirin EC 81 MG tablet Take 1 tablet (81 mg total) by mouth daily. Swallow whole. 06/12/23   Leroy Sea, MD  atorvastatin (LIPITOR) 40 MG tablet Take 40 mg by mouth every evening. 05/22/23   [provider]  carvedilol (COREG) 6.25 MG tablet Take 6.25 mg by mouth in the morning and at bedtime. 09/19/23   [provider]  cholecalciferol (VITAMIN D3) 25 MCG (1000 UNIT) tablet Take 1,000 Units by mouth daily. 05/22/23   [provider]  Cholecalciferol (VITAMIN D3) 250 MCG (10000 UT) capsule Take 10,000 Units by mouth daily. 09/19/23   [provider]  clonazePAM (KLONOPIN) 1 MG tablet Take 1 tablet (1 mg total) by mouth 2 (two) times daily. 06/13/23   Windell Norfolk, MD  folic acid (FOLVITE) 1 MG tablet Take 1 tablet (1 mg total) by mouth daily. 06/12/23   Leroy Sea, MD  gabapentin (NEURONTIN) 300 MG capsule Take 1 capsule (300 mg total) by mouth 3 (three) times daily. 06/13/23  06/07/24  Windell Norfolk, MD  Lacosamide 100 MG TABS Take 1 tablet (100 mg total) by mouth 2 (two) times daily. 06/13/23 06/07/24  Windell Norfolk, MD  levETIRAcetam (KEPPRA) 1000 MG tablet Take 2 tablets (2,000 mg total) by mouth 2 (two) times daily. 06/13/23 06/07/24  Windell Norfolk, MD  metoprolol tartrate (LOPRESSOR) 25 MG tablet Take 0.5 tablets (12.5 mg total) by mouth 2 (two) times daily. 12/24/22   Rhetta Mura, MD  nicotine (NICODERM CQ - DOSED IN MG/24 HOURS) 21 mg/24hr patch Place 1 patch (21 mg total) onto the skin daily. 06/12/23   Leroy Sea, MD  omeprazole (PRILOSEC) 40 MG capsule Take 40 mg by mouth daily. 10/09/22   [provider]  PHENobarbital (LUMINAL) 64.8 MG tablet Take 1 tablet (64.8 mg total) by mouth 2 (two) times daily. 06/13/23 06/07/24  Windell Norfolk, MD  thiamine (VITAMIN B1) 100 MG tablet Take 1 tablet (100 mg total) by mouth daily. 06/12/23   Leroy Sea, MD  Thiamine HCl (B-1) 100 MG TABS Take 1 tablet by mouth every morning. 05/22/23   [provider]      Allergies    Patient has no known allergies.    Review of Systems   Review of Systems  Neurological:  Positive for speech difficulty and weakness.  All other systems reviewed and are negative.   Physical Exam  Updated Vital Signs BP (!) 140/89 (BP Location: Right Arm)   Pulse 70   Temp 98.4 F (36.9 C) (Oral)   Resp 20   Ht 6\' 3"  (1.905 m)   Wt 73.8 kg   SpO2 91%   BMI 20.34 kg/m  Physical Exam Vitals and nursing note reviewed.  Constitutional:      Appearance: Normal appearance.  HENT:     Head: Normocephalic and atraumatic.     Right Ear: External ear normal.     Left Ear: External ear normal.     Nose: Nose normal.     Mouth/Throat:     Mouth: Mucous membranes are moist.     Pharynx: Oropharynx is clear.  Eyes:     Extraocular Movements: Extraocular movements intact.     Conjunctiva/sclera: Conjunctivae normal.     Pupils: Pupils are equal, round,  and reactive to light.  Cardiovascular:     Rate and Rhythm: Normal rate and regular rhythm.     Pulses: Normal pulses.     Heart sounds: Normal heart sounds.  Pulmonary:     Effort: Pulmonary effort is normal.     Breath sounds: Normal breath sounds.  Abdominal:     General: Abdomen is flat. Bowel sounds are normal.     Palpations: Abdomen is soft.  Musculoskeletal:        General: Normal range of motion.     Cervical back: Normal range of motion and neck supple.  Skin:    General: Skin is warm.     Capillary Refill: Capillary refill takes less than 2 seconds.  Neurological:     Mental Status: He is alert and oriented to person, place, and time.     Comments: Slurred speech.  Right sided weakness     ED Results / Procedures / Treatments   Labs (all labs ordered are listed, but only abnormal results are displayed) Labs Reviewed  CBC - Abnormal; Notable for the following components:      Result Value   RDW 15.9 (*)    All other components within normal limits  COMPREHENSIVE METABOLIC PANEL - Abnormal; Notable for the following components:   Alkaline Phosphatase 166 (*)    All other components within normal limits  ETHANOL  PROTIME-INR  APTT  DIFFERENTIAL  RAPID URINE DRUG SCREEN, HOSP PERFORMED  URINALYSIS, ROUTINE W REFLEX MICROSCOPIC  LEVETIRACETAM LEVEL  LACOSAMIDE  I-STAT CHEM 8, ED  CBG MONITORING, ED    EKG EKG Interpretation Date/Time:  Wednesday November 21 2023 22:35:08 EDT Ventricular Rate:  65 PR Interval:  246 QRS Duration:  81 QT Interval:  397 QTC Calculation: 413 R Axis:   50  Text Interpretation: Sinus rhythm Prolonged PR interval Consider anterior infarct ST elevation suggests acute pericarditis No significant change since last tracing Confirmed by Jacalyn Lefevre [phone]) on 11/21/2023 10:45:03 PM  Radiology CT ANGIO HEAD NECK W WO CM (CODE STROKE) Result Date: 11/21/2023 CLINICAL DATA:  Neuro deficit, acute, stroke suspected EXAM: CT  ANGIOGRAPHY HEAD AND NECK WITH AND WITHOUT CONTRAST TECHNIQUE: Multidetector CT imaging of the head and neck was performed using the standard protocol during bolus administration of intravenous contrast. Multiplanar CT image reconstructions and MIPs were obtained to evaluate the vascular anatomy. Carotid stenosis measurements (when applicable) are obtained utilizing NASCET criteria, using the distal internal carotid diameter as the denominator. RADIATION DOSE REDUCTION: This exam was performed according to the departmental dose-optimization program which includes automated exposure control, adjustment of the mA and/or kV  according to patient size and/or use of iterative reconstruction technique. CONTRAST:  75mL OMNIPAQUE IOHEXOL 350 MG/ML SOLN COMPARISON:  CTA head/neck October 12 24. FINDINGS: CTA NECK FINDINGS Aortic arch: Great vessel origins are patent without significant stenosis. Aortic atherosclerosis. Right carotid system: No evidence of significant stenosis (50% or greater), or occlusion. 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 Left carotid system: Patent. 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 The ICA pseudoaneurysm is more thrombosed than on the prior. Vertebral arteries: Codominant. No evidence of dissection, stenosis (50% or greater), or occlusion. Chronically small. Skeleton: No acute abnormality on limited assessment. Other neck: No acute abnormality on limited assessment. Upper chest: Emphysema. Review of the MIP images confirms the above findings CTA HEAD FINDINGS Anterior circulation: Bilateral intracranial ICAs, MCAs, and bilateral ACAs are patent without proximal hemodynamically significant stenosis. Posterior circulation: Bilateral intradural vertebral arteries, basilar artery and bilateral posterior cerebral arteries are patent without proximal hemodynamically significant stenosis.  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 Venous sinuses: As permitted by contrast timing, patent. Anatomic variants: Detailed above. Review of the MIP images confirms the above findings IMPRESSION: 1. No emergent large vessel occlusion or proximal hemodynamically significant stenosis intracranially. 2. 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 3. Aortic Atherosclerosis (ICD10-I70.0) and Emphysema (ICD10-J43.9). Electronically Signed   By: Feliberto Harts M.D.   On: 11/21/2023 22:45   CT HEAD CODE STROKE WO CONTRAST Result Date: 11/21/2023 CLINICAL DATA:  Code stroke.  Neuro deficit, acute, stroke suspected EXAM: CT HEAD WITHOUT CONTRAST TECHNIQUE: Contiguous axial images were obtained from the base of the skull through the vertex without intravenous contrast. RADIATION DOSE REDUCTION: This exam was performed according to the departmental dose-optimization program which includes automated exposure control, adjustment of the mA and/or kV according to patient size and/or use of iterative reconstruction technique. COMPARISON:  CT head October 09, 2023. FINDINGS: Brain: Remote cerebellar infarcts. Patchy white matter hypodensities, compatible with chronic microvascular ischemic disease. No evidence of acute large vascular territory infarct, acute hemorrhage or mass lesion. Cerebral atrophy. No hydrocephalus. Vascular: No hyperdense vessel. Skull: No acute fracture. Sinuses/Orbits: Clear sinuses.  No acute orbital findings. Other: No mastoid effusions. ASPECTS [facility] Stroke Program Early CT Score) Total score (0-10 with 10 being normal): 10. IMPRESSION: 1. No evidence of acute intracranial abnormality. 2. ASPECTS is 10. Code stroke imaging results were communicated on 11/21/2023 at 10:30 pm to provider Dr.  Derry Lory Via secure text paging. Electronically Signed   By: Feliberto Harts M.D.   On: 11/21/2023 22:32    Procedures Procedures  {Document cardiac monitor, telemetry assessment procedure when appropriate:1}  Medications Ordered in ED Medications  iohexol (OMNIPAQUE) 350 MG/ML injection 75 mL (75 mLs Intravenous Contrast Given 11/21/23 2221)    ED Course/ Medical Decision Making/ A&P   {   Click here for ABCD2, HEART and other calculatorsREFRESH Note before signing :1}                              Medical Decision Making Amount and/or Complexity of Data Reviewed Labs: ordered. Radiology: ordered.   This patient presents to the ED for concern of cva, this involves an extensive number of treatment options, and is a complaint  that carries with it a high risk of complications and morbidity.  The differential diagnosis includes cva, tia, etoh intox, seizure   Co morbidities that complicate the patient evaluation  gout, depression, htn, cva, seizures, and copd   Additional history obtained:  Additional history obtained from epic chart review External records from outside source obtained and reviewed including ***   Lab Tests:  I Ordered, and personally interpreted labs.  The pertinent results include:  ***   Imaging Studies ordered:  I ordered imaging studies including ***  I independently visualized and interpreted imaging which showed *** I agree with the radiologist interpretation   Cardiac Monitoring:  The patient was maintained on a cardiac monitor.  I personally viewed and interpreted the cardiac monitored which showed an underlying rhythm of: ***   Medicines ordered and prescription drug management:  I ordered medication including ***  for ***  Reevaluation of the patient after these medicines showed that the patient {resolved/improved/worsened:23923::"improved"} I have reviewed the patients home medicines and have made adjustments as needed   Test  Considered:  ***   Critical Interventions:  ***   Consultations Obtained:  I requested consultation with the ***,  and discussed lab and imaging findings as well as pertinent plan - they recommend: ***   Problem List / ED Course:  ***   Reevaluation:  After the interventions noted above, I reevaluated the patient and found that they have :{resolved/improved/worsened:23923::"improved"}   Social Determinants of Health:  ***   Dispostion:  After consideration of the diagnostic results and the patients response to treatment, I feel that the patent would benefit from ***.    {Document critical care time when appropriate:1} {Document review of labs and clinical decision tools ie heart score, Chads2Vasc2 etc:1}  {Document your independent review of radiology images, and any outside records:1} {Document your discussion with family members, caretakers, and with consultants:1} {Document social determinants of health affecting pt's care:1} {Document your decision making why or why not admission, treatments were needed:1} Final Clinical Impression(s) / ED Diagnoses Final diagnoses:  None    Rx / DC Orders ED Discharge Orders     None
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
## 2023-11-22 ENCOUNTER — Emergency Department (HOSPITAL_COMMUNITY)

## 2023-11-22 DIAGNOSIS — Z8249 Family history of ischemic heart disease and other diseases of the circulatory system: Secondary | ICD-10-CM | POA: Diagnosis not present

## 2023-11-22 DIAGNOSIS — E785 Hyperlipidemia, unspecified: Secondary | ICD-10-CM | POA: Diagnosis present

## 2023-11-22 DIAGNOSIS — Z981 Arthrodesis status: Secondary | ICD-10-CM | POA: Diagnosis not present

## 2023-11-22 DIAGNOSIS — R531 Weakness: Secondary | ICD-10-CM | POA: Diagnosis present

## 2023-11-22 DIAGNOSIS — Z89432 Acquired absence of left foot: Secondary | ICD-10-CM | POA: Diagnosis not present

## 2023-11-22 DIAGNOSIS — R29706 NIHSS score 6: Secondary | ICD-10-CM | POA: Diagnosis present

## 2023-11-22 DIAGNOSIS — F17210 Nicotine dependence, cigarettes, uncomplicated: Secondary | ICD-10-CM | POA: Diagnosis present

## 2023-11-22 DIAGNOSIS — Z79899 Other long term (current) drug therapy: Secondary | ICD-10-CM | POA: Diagnosis not present

## 2023-11-22 DIAGNOSIS — G9341 Metabolic encephalopathy: Secondary | ICD-10-CM | POA: Diagnosis present

## 2023-11-22 DIAGNOSIS — I69351 Hemiplegia and hemiparesis following cerebral infarction affecting right dominant side: Secondary | ICD-10-CM | POA: Diagnosis not present

## 2023-11-22 DIAGNOSIS — F419 Anxiety disorder, unspecified: Secondary | ICD-10-CM | POA: Diagnosis present

## 2023-11-22 DIAGNOSIS — I10 Essential (primary) hypertension: Secondary | ICD-10-CM | POA: Diagnosis present

## 2023-11-22 DIAGNOSIS — Z825 Family history of asthma and other chronic lower respiratory diseases: Secondary | ICD-10-CM | POA: Diagnosis not present

## 2023-11-22 DIAGNOSIS — F32A Depression, unspecified: Secondary | ICD-10-CM | POA: Diagnosis present

## 2023-11-22 DIAGNOSIS — J449 Chronic obstructive pulmonary disease, unspecified: Secondary | ICD-10-CM | POA: Diagnosis present

## 2023-11-22 DIAGNOSIS — Z818 Family history of other mental and behavioral disorders: Secondary | ICD-10-CM | POA: Diagnosis not present

## 2023-11-22 DIAGNOSIS — N400 Enlarged prostate without lower urinary tract symptoms: Secondary | ICD-10-CM | POA: Diagnosis present

## 2023-11-22 DIAGNOSIS — Z720 Tobacco use: Secondary | ICD-10-CM | POA: Diagnosis not present

## 2023-11-22 DIAGNOSIS — G40909 Epilepsy, unspecified, not intractable, without status epilepticus: Secondary | ICD-10-CM | POA: Diagnosis present

## 2023-11-22 DIAGNOSIS — R5381 Other malaise: Secondary | ICD-10-CM | POA: Diagnosis present

## 2023-11-22 DIAGNOSIS — W010XXA Fall on same level from slipping, tripping and stumbling without subsequent striking against object, initial encounter: Secondary | ICD-10-CM | POA: Diagnosis present

## 2023-11-22 DIAGNOSIS — I6522 Occlusion and stenosis of left carotid artery: Secondary | ICD-10-CM | POA: Diagnosis present

## 2023-11-22 DIAGNOSIS — I671 Cerebral aneurysm, nonruptured: Secondary | ICD-10-CM | POA: Diagnosis present

## 2023-11-22 DIAGNOSIS — M109 Gout, unspecified: Secondary | ICD-10-CM | POA: Diagnosis present

## 2023-11-22 DIAGNOSIS — G629 Polyneuropathy, unspecified: Secondary | ICD-10-CM | POA: Diagnosis present

## 2023-11-22 DIAGNOSIS — Z7409 Other reduced mobility: Secondary | ICD-10-CM | POA: Diagnosis present

## 2023-11-22 DIAGNOSIS — Z7982 Long term (current) use of aspirin: Secondary | ICD-10-CM | POA: Diagnosis not present

## 2023-11-22 LAB — DIFFERENTIAL
Abs Immature Granulocytes: 0 10*3/uL (ref 0.00–0.07)
Basophils Absolute: 0 10*3/uL (ref 0.0–0.1)
Basophils Relative: 0 %
Eosinophils Absolute: 0.6 10*3/uL — ABNORMAL HIGH (ref 0.0–0.5)
Eosinophils Relative: 9 %
Lymphocytes Relative: 40 %
Lymphs Abs: 2.5 10*3/uL (ref 0.7–4.0)
Monocytes Absolute: 0.5 10*3/uL (ref 0.1–1.0)
Monocytes Relative: 8 %
Neutro Abs: 2.7 10*3/uL (ref 1.7–7.7)
Neutrophils Relative %: 43 %
nRBC: 0 /100{WBCs}

## 2023-11-22 LAB — CBC
HCT: 47.9 % (ref 39.0–52.0)
Hemoglobin: 14.8 g/dL (ref 13.0–17.0)
MCH: 27.6 pg (ref 26.0–34.0)
MCHC: 30.9 g/dL (ref 30.0–36.0)
MCV: 89.2 fL (ref 80.0–100.0)
Platelets: 216 10*3/uL (ref 150–400)
RBC: 5.37 MIL/uL (ref 4.22–5.81)
RDW: 15.9 % — ABNORMAL HIGH (ref 11.5–15.5)
WBC: 6.3 10*3/uL (ref 4.0–10.5)
nRBC: 0 % (ref 0.0–0.2)

## 2023-11-22 LAB — URINALYSIS, ROUTINE W REFLEX MICROSCOPIC
Bilirubin Urine: NEGATIVE
Glucose, UA: NEGATIVE mg/dL
Hgb urine dipstick: NEGATIVE
Ketones, ur: NEGATIVE mg/dL
Leukocytes,Ua: NEGATIVE
Nitrite: NEGATIVE
Protein, ur: NEGATIVE mg/dL
Specific Gravity, Urine: 1.038 — ABNORMAL HIGH (ref 1.005–1.030)
pH: 5 (ref 5.0–8.0)

## 2023-11-22 LAB — AMMONIA: Ammonia: 33 umol/L (ref 9–35)

## 2023-11-22 LAB — RAPID URINE DRUG SCREEN, HOSP PERFORMED
Amphetamines: NOT DETECTED
Barbiturates: POSITIVE — AB
Benzodiazepines: NOT DETECTED
Cocaine: NOT DETECTED
Opiates: NOT DETECTED
Tetrahydrocannabinol: NOT DETECTED

## 2023-11-22 MED ORDER — ACETAMINOPHEN 650 MG RE SUPP: 650.0000 mg | Freq: Four times a day (QID) | RECTAL | Status: DC | PRN

## 2023-11-22 MED ORDER — LACTATED RINGERS IV BOLUS
1000.0000 mL | Freq: Once | INTRAVENOUS | Status: AC
  Administered 2023-11-22: 1000 mL via INTRAVENOUS

## 2023-11-22 MED ORDER — HYDRALAZINE HCL 20 MG/ML IJ SOLN: 10.0000 mg | Freq: Four times a day (QID) | INTRAMUSCULAR | Status: DC | PRN

## 2023-11-22 MED ORDER — SODIUM CHLORIDE 0.9 % IV SOLN
100.0000 mg | Freq: Two times a day (BID) | INTRAVENOUS | Status: DC
  Administered 2023-11-22 – 2023-11-23 (×5): 100 mg via INTRAVENOUS
  Filled 2023-11-22 (×8): qty 10

## 2023-11-22 MED ORDER — ACETAMINOPHEN 325 MG PO TABS: 650.0000 mg | ORAL_TABLET | Freq: Four times a day (QID) | ORAL | Status: DC | PRN

## 2023-11-22 MED ORDER — SODIUM CHLORIDE 0.9 % IV SOLN
2000.0000 mg | Freq: Two times a day (BID) | INTRAVENOUS | Status: DC
  Administered 2023-11-22 – 2023-11-23 (×5): 2000 mg via INTRAVENOUS
  Filled 2023-11-22 (×7): qty 20

## 2023-11-22 MED ORDER — AMLODIPINE BESYLATE 10 MG PO TABS
10.0000 mg | ORAL_TABLET | Freq: Every day | ORAL | Status: DC
  Administered 2023-11-22 – 2023-11-26 (×5): 10 mg via ORAL
  Filled 2023-11-22: qty 2
  Filled 2023-11-22 (×4): qty 1

## 2023-11-22 MED ORDER — LEVETIRACETAM 500 MG PO TABS: 2000.0000 mg | ORAL_TABLET | Freq: Two times a day (BID) | ORAL | Status: DC

## 2023-11-22 MED ORDER — BISACODYL 5 MG PO TBEC: 5.0000 mg | DELAYED_RELEASE_TABLET | Freq: Every day | ORAL | Status: DC | PRN

## 2023-11-22 MED ORDER — ENOXAPARIN SODIUM 40 MG/0.4ML IJ SOSY
40.0000 mg | PREFILLED_SYRINGE | INTRAMUSCULAR | Status: DC
  Administered 2023-11-23 – 2023-11-26 (×4): 40 mg via SUBCUTANEOUS
  Filled 2023-11-22 (×4): qty 0.4

## 2023-11-22 MED ORDER — ALBUTEROL SULFATE (2.5 MG/3ML) 0.083% IN NEBU: 2.5000 mg | INHALATION_SOLUTION | Freq: Four times a day (QID) | RESPIRATORY_TRACT | Status: DC | PRN

## 2023-11-22 MED ORDER — ATORVASTATIN CALCIUM 40 MG PO TABS
40.0000 mg | ORAL_TABLET | Freq: Every evening | ORAL | Status: DC
  Administered 2023-11-22 – 2023-11-25 (×4): 40 mg via ORAL
  Filled 2023-11-22 (×4): qty 1

## 2023-11-22 MED ORDER — PHENOBARBITAL 32.4 MG PO TABS
64.8000 mg | ORAL_TABLET | Freq: Two times a day (BID) | ORAL | Status: DC
  Administered 2023-11-22 – 2023-11-26 (×8): 64.8 mg via ORAL
  Filled 2023-11-22 (×8): qty 2

## 2023-11-22 MED ORDER — LORAZEPAM 2 MG/ML IJ SOLN
1.0000 mg | Freq: Once | INTRAMUSCULAR | Status: AC
  Administered 2023-11-22: 1 mg via INTRAVENOUS
  Filled 2023-11-22: qty 1

## 2023-11-22 MED ORDER — CARVEDILOL 6.25 MG PO TABS
6.2500 mg | ORAL_TABLET | Freq: Two times a day (BID) | ORAL | Status: DC
  Administered 2023-11-22 – 2023-11-26 (×8): 6.25 mg via ORAL
  Filled 2023-11-22 (×8): qty 1

## 2023-11-22 MED ORDER — SODIUM CHLORIDE 0.9 % IV SOLN: INTRAVENOUS | Status: DC

## 2023-11-22 MED ORDER — OXYCODONE HCL 5 MG PO TABS: 5.0000 mg | ORAL_TABLET | ORAL | Status: DC | PRN

## 2023-11-22 MED ORDER — GABAPENTIN 300 MG PO CAPS
300.0000 mg | ORAL_CAPSULE | Freq: Two times a day (BID) | ORAL | Status: DC
  Administered 2023-11-22 – 2023-11-26 (×9): 300 mg via ORAL
  Filled 2023-11-22 (×9): qty 1

## 2023-11-22 MED ORDER — ASPIRIN 81 MG PO TBEC
81.0000 mg | DELAYED_RELEASE_TABLET | Freq: Every day | ORAL | Status: DC
  Administered 2023-11-22 – 2023-11-26 (×5): 81 mg via ORAL
  Filled 2023-11-22 (×5): qty 1

## 2023-11-22 MED ORDER — PANTOPRAZOLE SODIUM 40 MG PO TBEC
40.0000 mg | DELAYED_RELEASE_TABLET | Freq: Every day | ORAL | Status: DC
  Administered 2023-11-22 – 2023-11-26 (×5): 40 mg via ORAL
  Filled 2023-11-22 (×5): qty 1

## 2023-11-22 MED ORDER — POLYETHYLENE GLYCOL 3350 17 G PO PACK: 17.0000 g | PACK | Freq: Every day | ORAL | Status: DC | PRN

## 2023-11-22 NOTE — Plan of Care (Signed)

## 2023-11-22 NOTE — Progress Notes (Signed)
 [facility] ED12 [facility] Liaison Note  This is a current patient of [facility] outpatient palliative care program.  We will follow for discharge disposition.  Please call with any questions or concerns.  Thank you, Haynes Bast, BSN, [facility]  [phone]

## 2023-11-22 NOTE — ED Notes (Signed)
 PT unable to get MRI  because he is unable to lay his head flat
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
## 2023-11-22 NOTE — Progress Notes (Signed)
 Patient admitted to floor at this time. Patient too drowsy for admission questions. Responsive to his name and alert & oriented x4 but falling asleep during questions. Will retry admission questions at a later time.

## 2023-11-22 NOTE — ED Notes (Signed)
 PT at bedside.

## 2023-11-22 NOTE — Evaluation (Signed)
 Physical Therapy Evaluation Patient Details Name: Seth [name] MRN: [id number] DOB: [birth year] Today's Date: 11/22/2023  History of Present Illness  Seth [name] is a 68 y.o. male  brought to ED 3/26 from home by as a code stroke. MRI brain did not show any acute intracranial abnormality, showed remote bilateral cerebellar infarcts and cerebral atrophy. PMH significant for HTN, stroke with right-sided residual weakness, seizure, COPD, BPH, depression.   Clinical Impression  Pt presents with above complications. Seems to be more alert and functional at time of PT evaluation than earlier reports indicate from his status this morning. He is a limited historian, unsure of current month,year, birth year, or situation, but knows his name and that he is at [facility]. Using [facility] and RW alternatively at home with denial of any recent falls. States he lives with a daughter and mother, but daughter works and he is alone with his disabled mother most of the day. Information provided today conflicts at times from prior information obtained during evaluation by PT last year. He required min assist for bed mobility, and demonstrates several episodes of LOB while ambulating with RW today requiring up to mod assist. Patient will benefit from continued inpatient follow up therapy, <3 hours/day. I worry about him falling at home given the report that he will be without daughter's support during the day. If family can provide 24/7 care, or he makes great improvements prior to D/c. HHPT would be a good option to help restore his function.  Pt currently with functional limitations due to the deficits listed below (see PT Problem List). Pt will benefit from acute skilled PT to increase their independence and safety with mobility to allow discharge.           If plan is discharge home, recommend the following: A little help with walking and/or transfers;A little help with bathing/dressing/bathroom;Assistance with  cooking/housework;Direct supervision/assist for medications management;Direct supervision/assist for financial management;Assist for transportation;Help with stairs or ramp for entrance;Supervision due to cognitive status   Can travel by private vehicle   Yes    Equipment Recommendations None recommended by PT  Recommendations for Other Services       Functional Status Assessment Patient has had a recent decline in their functional status and demonstrates the ability to make significant improvements in function in a reasonable and predictable amount of time.     Precautions / Restrictions Precautions Precautions: Fall Recall of Precautions/Restrictions: Impaired Restrictions Weight Bearing Restrictions Per Provider Order: No      Mobility  Bed Mobility Overal bed mobility: Needs Assistance Bed Mobility: Supine to Sit, Sit to Supine     Supine to sit: Min assist Sit to supine: Min assist   General bed mobility comments: Min assist for trunk support and balance to sit EOB initially. Min assist for RLE into bed.    Transfers Overall transfer level: Needs assistance Equipment used: Rolling [place] (2 wheels) Transfers: Sit to/from Stand Sit to Stand: Min assist           General transfer comment: Min assist for boost to stand, and balance due to heavy posterior lean, using back of legs braced against bed. Improved with multimodal cues to facilitate weight shift onto RW.    Ambulation/Gait Ambulation/Gait assistance: Min assist Gait Distance (Feet): 65 Feet Assistive device: Rolling [place] (2 wheels) Gait Pattern/deviations: Step-through pattern, Decreased stride length, Trunk flexed, Narrow base of support, Leaning posteriorly Gait velocity: dec Gait velocity interpretation: <1.31 ft/sec, indicative of household ambulator  General Gait Details: Intermittent min assist for posterior LOB, one episode to stumbling forward while turning required intervention for balance  and RW control. Educated on findings, awareness, and safety with AD.  Stairs            Wheelchair Mobility     Tilt Bed    Modified Rankin (Stroke Patients Only)       Balance Overall balance assessment: Needs assistance Sitting-balance support: No upper extremity supported, Feet supported Sitting balance-[name] Scale: Poor   Postural control: Posterior lean Standing balance support: Single extremity supported Standing balance-[name] Scale: Poor Standing balance comment: posterior LOB                             Pertinent Vitals/Pain Pain Assessment Pain Assessment: No/denies pain    Home Living Family/patient expects to be discharged to:: Private residence Living Arrangements: Parent;Children Available Help at Discharge: Family (daughter works) Type of Home: House Home Access: Ramped entrance (in back)       Home Layout: Multi-level;Able to live on main level with bedroom/bathroom Home Equipment: Rolling [place] (2 wheels);Cane - single point Additional Comments: Impaired cognition during assessment, answers not consistent with information obtained during prior admission last year.    Prior Function Prior Level of Function : Needs assist             Mobility Comments: States he ambulates, alternating between RW and SPC, denies falls. ADLs Comments: Reports he is able to bath/dress himself. Family cleans.     Extremity/Trunk Assessment   Upper Extremity Assessment Upper Extremity Assessment: Defer to OT evaluation    Lower Extremity Assessment Lower Extremity Assessment: RLE deficits/detail;Difficult to assess due to impaired cognition;Generalized weakness RLE Deficits / Details: Rt ankle DF 4-/5 RLE Sensation: WNL       Communication   Communication Communication: Impaired Factors Affecting Communication: Reduced clarity of speech    Cognition Arousal: Alert Behavior During Therapy: Flat affect   PT - Cognitive impairments: No  family/caregiver present to determine baseline, Orientation, Initiation, Problem solving   Orientation impairments: Time, Situation                   PT - Cognition Comments: Pt oriented to self and location but cannot recall his birth year, current month or year, situation. Following commands: Impaired Following commands impaired: Only follows one step commands consistently, Follows one step commands with increased time     Cueing Cueing Techniques: Verbal cues, Gestural cues     General Comments      Exercises     Assessment/Plan    PT Assessment Patient needs continued PT services  PT Problem List Decreased strength;Decreased range of motion;Decreased activity tolerance;Decreased balance;Decreased mobility;Decreased coordination;Decreased cognition;Decreased knowledge of use of DME;Decreased safety awareness;Decreased knowledge of precautions       PT Treatment Interventions DME instruction;Gait training;Functional mobility training;Therapeutic activities;Therapeutic exercise;Balance training;Neuromuscular re-education;Cognitive remediation;Patient/family education    PT Goals (Current goals can be found in the Care Plan section)  Acute Rehab PT Goals Patient Stated Goal: Get stronger PT Goal Formulation: With patient Time For Goal Achievement: 12/06/23 Potential to Achieve Goals: Good    Frequency Min 2X/week     Co-evaluation               AM-PAC PT "6 Clicks" Mobility  Outcome Measure Help needed turning from your back to your side while in a flat bed without using bedrails?: None Help needed moving from  lying on your back to sitting on the side of a flat bed without using bedrails?: A Little Help needed moving to and from a bed to a chair (including a wheelchair)?: A Little Help needed standing up from a chair using your arms (e.g., wheelchair or bedside chair)?: A Little Help needed to walk in hospital room?: A Little Help needed climbing 3-5 steps  with a railing? : A Lot 6 Click Score: 18    End of Session Equipment Utilized During Treatment: Gait belt Activity Tolerance: Patient tolerated treatment well Patient left: with call bell/phone within reach (On stretcher) Nurse Communication: Mobility status (Student Nurse informed) PT Visit Diagnosis: Unsteadiness on feet (R26.81);Other abnormalities of gait and mobility (R26.89);Hemiplegia and hemiparesis;Other symptoms and signs involving the nervous system (R29.898);Difficulty in walking, not elsewhere classified (R26.2);Muscle weakness (generalized) (M62.81) Hemiplegia - Right/Left: Right Hemiplegia - caused by: Unspecified    Time: 1203-1235 PT Time Calculation (min) (ACUTE ONLY): 32 min   Charges:   PT Evaluation $PT Eval Moderate Complexity: 1 Mod PT Treatments $Therapeutic Activity: 8-22 mins PT General Charges $$ ACUTE PT VISIT: 1 Visit         Kathlyn Sacramento, PT, DPT [facility]  Rehabilitation Services Physical Therapist Office: [phone] Website: [email or web address]   Berton Mount 11/22/2023, 2:05 PM

## 2023-11-22 NOTE — ED Notes (Signed)
 Patient transported to MRI

## 2023-11-22 NOTE — ED Notes (Signed)
 Facilities called to check [facility] call system.  Error Msg: Cord Disc and Pt Equip 1.

## 2023-11-22 NOTE — Evaluation (Signed)
 Occupational Therapy Evaluation Patient Details Name: Seth [name] MRN: [id number] DOB: 07-31-1956 Today's Date: 11/22/2023   History of Present Illness   Seth [name] is a 68 y.o. male  brought to ED 3/26 from home by as a code stroke. MRI brain did not show any acute intracranial abnormality, showed remote bilateral cerebellar infarcts and cerebral atrophy. PMH significant for HTN, stroke with right-sided residual weakness, seizure, COPD, BPH, depression. 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 Currently he is setup-total A for basic ADLs and is not safe at a RW to be up and about on his own. He will continue to benefit from acute OT with follow up  from continued inpatient follow up therapy, <3 hours/day.      If plan is discharge home, recommend the following:   A lot of help with walking and/or transfers;A lot of help with bathing/dressing/bathroom;Assistance with cooking/housework;Help with stairs or ramp for entrance;Assist for transportation;Direct supervision/assist for financial management;Direct supervision/assist for medications management     Functional Status Assessment   Patient has had a recent decline in their functional status and demonstrates the ability to make significant improvements in function in a reasonable and predictable amount of time.     Equipment Recommendations   BSC/3in1;Hospital bed      Precautions/Restrictions   Precautions Precautions: Fall Restrictions Weight Bearing Restrictions Per Provider Order: No     Mobility Bed Mobility Overal bed mobility: Needs Assistance Bed Mobility: Supine to Sit, Sit to Supine     Supine to sit: Total assist (lethargic) Sit to supine: Min assist (a for legs)        Transfers Overall transfer level:  Needs assistance Equipment used: Rolling K[place] (2 wheels) Transfers: Sit to/from Stand Sit to Stand: Min assist                  Balance Overall balance assessment: Needs assistance Sitting-balance support: No upper extremity supported, Feet supported Sitting balance-[name] Scale: Good     Standing balance support: Bilateral upper extremity supported Standing balance-[name] Scale: Poor Standing balance comment: posterior LOB                           ADL either performed or assessed with clinical judgement   ADL Overall ADL's : Needs assistance/impaired Eating/Feeding: Set up;Sitting Eating/Feeding Details (indicate cue type and reason): EOB Grooming: Set up;Supervision/safety;Sitting Grooming Details (indicate cue type and reason): EOB Upper Body Bathing: Moderate assistance;Sitting Upper Body Bathing Details (indicate cue type and reason): EOB Lower Body Bathing: Total assistance Lower Body Bathing Details (indicate cue type and reason): min A sit<>stand (posterior lean) Upper Body Dressing : Moderate assistance;Sitting Upper Body Dressing Details (indicate cue type and reason): EOB Lower Body Dressing: Total assistance Lower Body Dressing Details (indicate cue type and reason): min A sit<>stand EOB Toilet Transfer: Minimal assistance Toilet Transfer Details (indicate cue type and reason): side step up towards the [facility] Toileting- Clothing Manipulation and Hygiene: Total assistance Toileting - Clothing Manipulation Details (indicate cue type and reason): min A sit<>stand EOB             Vision Baseline Vision/History: 1 Wears glasses Patient Visual Report: No change from baseline  Pertinent Vitals/Pain Pain Assessment Pain Assessment: No/denies pain     Extremity/Trunk Assessment Upper Extremity Assessment Upper Extremity Assessment: Right hand dominant;RUE deficits/detail RUE Deficits / Details: Decreased AROM of shoulder the most,  decreased strength elbow and distally with OA in hands and contracted 5th digit RUE Coordination: decreased fine motor;decreased gross motor   Lower Extremity Assessment Lower Extremity Assessment: RLE deficits/detail;Difficult to assess due to impaired cognition;Generalized weakness RLE Deficits / Details: Rt ankle DF 4-/5 RLE Sensation: WNL       Communication Communication Communication: Impaired Factors Affecting Communication: Reduced clarity of speech   Cognition Arousal:  (initially lethargic, but woke up once he sat up on EOB) Behavior During Therapy: Flat affect Cognition: Cognition impaired   Orientation impairments: Person, Place, Time, Situation (2024, April, did not know where he was but could pick out hospital when given 3 choices, did not know why he was here) Awareness: Intellectual awareness impaired, Online awareness impaired Memory impairment (select all impairments): Declarative long-term memory Attention impairment (select first level of impairment): Sustained attention Executive functioning impairment (select all impairments): Initiation, Organization, Sequencing, Reasoning, Problem solving                   Following commands: Impaired Following commands impaired: Only follows one step commands consistently, Follows one step commands with increased time     Cueing   Cueing Techniques: Verbal cues;Gestural cues              Home Living Family/patient expects to be discharged to:: Private residence Living Arrangements: Parent;Other relatives ([name])) Available Help at Discharge:  (family reports they can no longer take care of him) Type of Home: House Home Access: Ramped entrance (in back where pt and his mother come in and out)     Home Layout: Multi-level;Able to live on main level with bedroom/bathroom     Bathroom Shower/Tub:  (shower on main level does not work)   Firefighter: Handicapped height Bathroom Accessibility:  No   Home Equipment: Agricultural consultant (2 wheels);Cane - single point   Additional Comments: Impaired cognition during assessment, answers not consistent with information obtained during prior admission last year.      Prior Functioning/Environment Prior Level of Function : Needs assist       Physical Assist : ADLs (physical)   ADLs (physical): Dressing;Bathing;Toileting Mobility Comments: ambulates with RW ADLs Comments: Reports he can bath/dress/toilet self but sister reports he needs A for all of this    OT Problem List: Decreased strength;Decreased range of motion;Decreased activity tolerance;Impaired balance (sitting and/or standing);Decreased safety awareness;Impaired UE functional use   OT Treatment/Interventions: Self-care/ADL training;Patient/family education;Balance training;DME and/or AE instruction      OT Goals(Current goals can be found in the care plan section)   Acute Rehab OT Goals Patient Stated Goal: to get stronger and feel better OT Goal Formulation: With patient Time For Goal Achievement: 12/06/23 Potential to Achieve Goals: Good   OT Frequency:  Min 2X/week       AM-PAC OT "6 Clicks" Daily Activity     Outcome Measure Help from another person eating meals?: A Little Help from another person taking care of personal grooming?: A Little Help from another person toileting, which includes using toliet, bedpan, or urinal?: A Lot Help from another person bathing (including washing, rinsing, drying)?: A Lot Help from another person to put on and taking off regular upper body clothing?: A Lot Help from another person to put on and taking off regular lower  body clothing?: Total 6 Click Score: 13   End of Session Equipment Utilized During Treatment: Gait belt;Rolling Filip (2 wheels)  Activity Tolerance: Patient tolerated treatment well Patient left: in bed;with call bell/phone within reach;with bed alarm set  OT Visit Diagnosis: Unsteadiness on feet  (R26.81);Other abnormalities of gait and mobility (R26.89);Muscle weakness (generalized) (M62.81);Hemiplegia and hemiparesis Hemiplegia - Right/Left: Right Hemiplegia - dominant/non-dominant: Dominant Hemiplegia - caused by: Cerebral infarction                Time: 9147-8295 OT Time Calculation (min): 21 min Charges:  OT General Charges $OT Visit: 1 Visit OT Evaluation $OT Eval Moderate Complexity: 1 Mod  Cathy L. OT Acute Rehabilitation Services Office [phone]    Evette Georges 11/22/2023, 4:36 PM

## 2023-11-22 NOTE — ED Notes (Signed)
 Attempted to walk pt with [place]red[place] and pt significantly weak in both lower extremities. Pt unable to take a couple steps without assistance. Pt uses a cane/Lui at baseline.

## 2023-11-22 NOTE — ED Provider Notes (Signed)
 12:01 AM Assumed care from Dr. Particia Nearing, please see their note for full history, physical and decision making until this point. In brief this is a 68 y.o. year old male who presented to the ED tonight with Code Stroke     68 yo here with worsening r sided deficitis. Code stroke called. Pending MRI and d/w neuro regarding disposition.  MRI negative for acute findings.  Antiepileptic levels pending at time of this note.  Unable to tell if the patient is at his baseline or not.  Will attempt ambulation.  He did have to get Ativan for the MRI so that could be playing into his sleepiness a little bit.  Patient still significantly weak. Can't stand up without two person assist. States he usually uses a cane but previous notes state that he usually uses a [place]? Either way, not sure if this is status, dehydration, wernickes, hepatic encephalopathy, polypharmacy or some other etiology but is not in any condition to go home. Will d/w TRH for admission.   D/w Dr. Pola Corn for admission.  Labs, studies and imaging reviewed by myself and considered in medical decision making if ordered. Imaging interpreted by radiology.  Labs Reviewed  CBC - Abnormal; Notable for the following components:      Result Value   RDW 15.9 (*)    All other components within normal limits  COMPREHENSIVE METABOLIC PANEL - Abnormal; Notable for the following components:   Alkaline Phosphatase 166 (*)    All other components within normal limits  ETHANOL  PROTIME-INR  APTT  DIFFERENTIAL  RAPID URINE DRUG SCREEN, HOSP PERFORMED  URINALYSIS, ROUTINE W REFLEX MICROSCOPIC  LEVETIRACETAM LEVEL  LACOSAMIDE  I-STAT CHEM 8, ED  CBG MONITORING, ED    CT ANGIO HEAD NECK W WO CM (CODE STROKE)  Final Result    CT HEAD CODE STROKE WO CONTRAST  Final Result    MR BRAIN WO CONTRAST    (Results Pending)    No follow-ups on file.    Gesselle Fitzsimons, Barbara Cower, MD 11/22/23 [phone]

## 2023-11-23 DIAGNOSIS — R531 Weakness: Secondary | ICD-10-CM | POA: Diagnosis not present

## 2023-11-23 LAB — CBC
HCT: 45.3 % (ref 39.0–52.0)
Hemoglobin: 14.8 g/dL (ref 13.0–17.0)
MCH: 28.3 pg (ref 26.0–34.0)
MCHC: 32.7 g/dL (ref 30.0–36.0)
MCV: 86.6 fL (ref 80.0–100.0)
Platelets: 200 10*3/uL (ref 150–400)
RBC: 5.23 MIL/uL (ref 4.22–5.81)
RDW: 15.5 % (ref 11.5–15.5)
WBC: 5.4 10*3/uL (ref 4.0–10.5)
nRBC: 0 % (ref 0.0–0.2)

## 2023-11-23 LAB — BASIC METABOLIC PANEL WITH GFR
Anion gap: 9 (ref 5–15)
BUN: 14 mg/dL (ref 8–23)
CO2: 24 mmol/L (ref 22–32)
Calcium: 8.8 mg/dL — ABNORMAL LOW (ref 8.9–10.3)
Chloride: 105 mmol/L (ref 98–111)
Creatinine, Ser: 0.97 mg/dL (ref 0.61–1.24)
GFR, Estimated: >60 mL/min (ref >60)
Glucose, Bld: 80 mg/dL (ref 70–99)
Potassium: 4.2 mmol/L (ref 3.5–5.1)
Sodium: 138 mmol/L (ref 135–145)

## 2023-11-23 LAB — HIV ANTIBODY (ROUTINE TESTING W REFLEX): HIV Screen 4th Generation wRfx: NONREACTIVE

## 2023-11-23 LAB — LEVETIRACETAM LEVEL: Levetiracetam Lvl: 153.4 ug/mL — ABNORMAL HIGH (ref 10.0–40.0)

## 2023-11-23 MED ORDER — CLONAZEPAM 0.5 MG PO TABS
0.5000 mg | ORAL_TABLET | Freq: Two times a day (BID) | ORAL | Status: DC
  Administered 2023-11-23 – 2023-11-26 (×6): 0.5 mg via ORAL
  Filled 2023-11-23 (×6): qty 1

## 2023-11-23 NOTE — [place] FL2 (Signed)
 [place] MEDICAID FL2 LEVEL OF CARE FORM     IDENTIFICATION  Patient Name: Seth [name] Birthdate: 1955/09/20 Sex: male Admission Date (Current Location): 11/21/2023  [facility] and [place] Number:  Producer, television/film/video and Address:  [facility] [place], [address] [place], [place] [address]      Provider Number: [id number]  Attending Physician Name and Address:  Lanae Boast, MD  Relative Name and Phone Number:  Glendel, Jaggers (Daughter)  [phone]    Current Level of Care: Hospital Recommended Level of Care: Skilled Nursing Facility Prior Approval Number:    Date Approved/Denied:   PASRR Number: [id number]  Discharge Plan: Home    Current Diagnoses: Patient Active Problem List   Diagnosis Date Noted   Generalized weakness 11/22/2023   General weakness 11/22/2023   Hyperlipidemia 12/20/2022   GERD (gastroesophageal reflux disease) 12/20/2022   Alcohol withdrawal syndrome without complication (HCC) 12/20/2022   Seizure (HCC) 11/29/2022   Seizure disorder (HCC) 10/18/2022   Asymptomatic bacteriuria 05/24/2022   HAP (hospital-acquired pneumonia) 05/23/2022   Multiple closed fractures of ribs of right side    Fall during current hospitalization 10/16/2021   Peripheral arterial disease (HCC)    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, not intractable, with status epilepticus (HCC)    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HCC)    Seizures (HCC) 05/17/2021   Pressure injury of skin 03/14/2021   Essential hypertension    Chronic alcohol use 01/01/2021   History of CVA with residual RUE weakness 01/01/2021   COVID-19 virus infection 09/14/2020   Acute osteomyelitis of left first metatarsal stump    AKI (acute kidney injury) (HCC) 07/20/2020   Hypoglycemia 07/20/2020   Right sided weakness 02/19/2019   Hyperkalemia 02/19/2019   Left fibular fracture 02/19/2019   Alcohol use  disorder 02/19/2019   Malnutrition of moderate degree 08/25/2017   Tobacco use disorder 08/23/2017   COPD (chronic obstructive pulmonary disease) (HCC) 08/23/2017    Orientation RESPIRATION BLADDER Height & Weight     Self, Time, Place  Normal Incontinent, External catheter Weight: 162 lb 11.2 oz (73.8 kg) Height:  6\' 3"  (190.5 cm)  BEHAVIORAL SYMPTOMS/MOOD NEUROLOGICAL BOWEL NUTRITION STATUS    Convulsions/Seizures Continent Diet (see DC summary)  AMBULATORY STATUS COMMUNICATION OF NEEDS Skin   Limited Assist Verbally Normal                       Personal Care Assistance Level of Assistance  Feeding, Dressing, Bathing Bathing Assistance: Limited assistance Feeding assistance: Limited assistance Dressing Assistance: Limited assistance     Functional Limitations Info  Sight, Hearing, Speech Sight Info: Adequate Hearing Info: Adequate Speech Info: Impaired (slurred)    SPECIAL CARE FACTORS FREQUENCY  PT (By licensed PT), OT (By licensed OT)     PT Frequency: 5x/week OT Frequency: 5x/week            Contractures Contractures Info: Not present    Additional Factors Info  Code Status, Allergies Code Status Info: FULL Allergies Info: NKA           Current Medications (11/23/2023):  This is the current hospital active medication list Current Facility-Administered Medications  Medication Dose Route Frequency Provider Last Rate Last Admin   0.9 %  sodium chloride infusion   Intravenous Continuous Dahal, Binaya, MD 75 mL/hr at 11/23/23 0624 Infusion Verify at 11/23/23 0624   acetaminophen (TYLENOL) tablet 650 mg  650 mg Oral Q6H  PRN Lorin Glass, MD       Or   acetaminophen (TYLENOL) suppository 650 mg  650 mg Rectal Q6H PRN Dahal, Binaya, MD       albuterol (PROVENTIL) (2.5 MG/3ML) 0.083% nebulizer solution 2.5 mg  2.5 mg Nebulization Q6H PRN Dahal, Binaya, MD       amLODipine (NORVASC) tablet 10 mg  10 mg Oral Daily Dahal, Binaya, MD   10 mg at 11/23/23 1102    aspirin EC tablet 81 mg  81 mg Oral Daily Dahal, Melina Schools, MD   81 mg at 11/23/23 1104   atorvastatin (LIPITOR) tablet 40 mg  40 mg Oral QPM Dahal, Melina Schools, MD   40 mg at 11/22/23 1750   bisacodyl (DULCOLAX) EC tablet 5 mg  5 mg Oral Daily PRN Dahal, Melina Schools, MD       carvedilol (COREG) tablet 6.25 mg  6.25 mg Oral BID WC Dahal, Binaya, MD   6.25 mg at 11/23/23 1101   clonazePAM (KLONOPIN) tablet 0.5 mg  0.5 mg Oral BID Kc, Ramesh, MD       enoxaparin (LOVENOX) injection 40 mg  40 mg Subcutaneous Q24H Dahal, Melina Schools, MD   40 mg at 11/23/23 1103   gabapentin (NEURONTIN) capsule 300 mg  300 mg Oral BID Lorin Glass, MD   300 mg at 11/23/23 1102   hydrALAZINE (APRESOLINE) injection 10 mg  10 mg Intravenous Q6H PRN Dahal, Melina Schools, MD       lacosamide (VIMPAT) 100 mg in sodium chloride 0.9 % 25 mL IVPB  100 mg Intravenous BID Erick Blinks, MD   Stopped at 11/22/23 2234   levETIRAcetam (KEPPRA) 2,000 mg in sodium chloride 0.9 % 250 mL IVPB  2,000 mg Intravenous BID Erick Blinks, MD 900 mL/hr at 11/23/23 1106 2,000 mg at 11/23/23 1106   oxyCODONE (Oxy IR/ROXICODONE) immediate release tablet 5 mg  5 mg Oral Q4H PRN Dahal, Melina Schools, MD       pantoprazole (PROTONIX) EC tablet 40 mg  40 mg Oral Daily Dahal, Binaya, MD   40 mg at 11/23/23 1102   PHENobarbital (LUMINAL) tablet 64.8 mg  64.8 mg Oral BID Dahal, Melina Schools, MD   64.8 mg at 11/23/23 1104   polyethylene glycol (MIRALAX / GLYCOLAX) packet 17 g  17 g Oral Daily PRN Lorin Glass, MD         Discharge Medications: Please see discharge summary for a list of discharge medications.  Relevant Imaging Results:  Relevant Lab Results:   Additional Information SSN:  [id number]  Ane Conerly A [place], LCSW

## 2023-11-23 NOTE — TOC Initial Note (Signed)
 Transition of Care [facility]) - Initial/Assessment Note    Patient Details  Name: Seth [name] MRN: [id number] Date of Birth: 1955/11/29  Transition of Care [facility]) CM/SW Contact:    Zacarias Krauter A [place], LCSW Phone Number: 11/23/2023, 11:18 AM  Clinical Narrative:                  CSW met with pt at bedside, pt stated that he was agreeable to SNF at discharge. SNF workup completed. Bed offers pending.   CSW followed up with pt's daughter, Pryor Montes to provide update on recommendation for SNF. CSW left VM with contact information to reach out to CSW.   TOC will continue to follow.   Expected Discharge Plan: Skilled Nursing Facility Barriers to Discharge: Continued Medical Work up, SNF Pending bed offer, Insurance Authorization   Patient Goals and CMS Choice            Expected Discharge Plan and Services                                              Prior Living Arrangements/Services              Need for Family Participation in Patient Care: Yes (Comment) Care giver support system in place?: Yes (comment) (Pt's daughter)      Activities of Daily Living   ADL Screening (condition at time of admission) Independently performs ADLs?: No Does the patient have a NEW difficulty with bathing/dressing/toileting/self-feeding that is expected to last >3 days?: Yes (Initiates electronic notice to provider for possible OT consult) Does the patient have a NEW difficulty with getting in/out of bed, walking, or climbing stairs that is expected to last >3 days?: Yes (Initiates electronic notice to provider for possible PT consult) Does the patient have a NEW difficulty with communication that is expected to last >3 days?: No Is the patient deaf or have difficulty hearing?: No Does the patient have difficulty seeing, even when wearing glasses/contacts?: No Does the patient have difficulty concentrating, remembering, or making decisions?: Yes  Permission Sought/Granted                   Emotional Assessment Appearance:: Appears older than stated age Attitude/Demeanor/Rapport:  (normal) Affect (typically observed): Appropriate Orientation: : Oriented to Self, Oriented to Place, Oriented to Situation Alcohol / Substance Use: Tobacco Use Psych Involvement: No (comment)  Admission diagnosis:  Slurred speech [R47.81] Weakness [R53.1] Generalized weakness [R53.1] General weakness [R53.1] Patient Active Problem List   Diagnosis Date Noted   Generalized weakness 11/22/2023   General weakness 11/22/2023   Hyperlipidemia 12/20/2022   GERD (gastroesophageal reflux disease) 12/20/2022   Alcohol withdrawal syndrome without complication (HCC) 12/20/2022   Seizure (HCC) 11/29/2022   Seizure disorder (HCC) 10/18/2022   Asymptomatic bacteriuria 05/24/2022   HAP (hospital-acquired pneumonia) 05/23/2022   Multiple closed fractures of ribs of right side    Fall during current hospitalization 10/16/2021   Peripheral arterial disease (HCC)    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, not intractable, with status epilepticus (HCC)    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HCC)    Seizures (HCC) 05/17/2021   Pressure injury of skin 03/14/2021   Essential hypertension    Chronic alcohol use 01/01/2021   History of CVA with residual RUE weakness 01/01/2021   COVID-19 virus infection 09/14/2020   Acute  osteomyelitis of left first metatarsal stump    AKI (acute kidney injury) (HCC) 07/20/2020   Hypoglycemia 07/20/2020   Right sided weakness 02/19/2019   Hyperkalemia 02/19/2019   Left fibular fracture 02/19/2019   Alcohol use disorder 02/19/2019   Malnutrition of moderate degree 08/25/2017   Tobacco use disorder 08/23/2017   COPD (chronic obstructive pulmonary disease) (HCC) 08/23/2017   PCP:  Hillery Aldo, NP Pharmacy:   [facility] [place], [place] - [address] [place] [address] Phone: [phone] Fax: [phone]  [facility] [address] [place] [address] Phone: [phone] Fax: [phone]     Social Drivers of Health (SDOH) Social History: SDOH Screenings   Food Insecurity: No Food Insecurity (11/22/2023)  Housing: Low Risk  (11/22/2023)  Transportation Needs: No Transportation Needs (11/22/2023)  Utilities: Not At Risk (11/22/2023)  Depression (PHQ2-9): Low Risk  (08/23/2020)  Financial Resource Strain: Medium Risk (12/07/2017)  Social Connections: Unknown (11/22/2023)  Stress: Stress Concern Present (12/07/2017)  Tobacco Use: High Risk (11/21/2023)   SDOH Interventions:     Readmission Risk Interventions    12/22/2022    4:22 PM  Readmission Risk Prevention Plan  Transportation Screening Complete  HRI or Home Care Consult Complete  Social Work Consult for Recovery Care Planning/Counseling Complete  Palliative Care Screening Not Applicable  Medication Review Oceanographer) Complete

## 2023-11-23 NOTE — Hospital Course (Addendum)
 68 y.o. male lives with his 2 sisters and mother at baseline very weak but he still tries to ambulate with a [place] and family trying to put him to facility, has PMH significant for HTN, stroke with right-sided residual weakness, seizure, COPD, BPH, depression, who was brought to ED on 11/21/23  from home by EMS last night as a code stroke. LKN 6 pm,  lost his balance while walking, fell and was unable to get up.He has baseline residual weakness in the right side since the stroke and after the falls he felt more weak on the right side.  Also noted to have drooling and slurred speech hence brought to the ED. In the ED underwent further workups, hemodynamically stable, labs fairly unremarkable UDS positive for barbiturates UA negative ammonia normal, ethanol less than 10. Underwent extensive imaging with CT head code stroke CT angio head and neck MRI brain without. MRI>remote cerebellar infarcts bilaterally, cerebral atrophy but no acute stroke. 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 Keppra Vimpat level sent.  Neurology was consulted since no acute stroke neurology did not recommend stroke workup. ED staff tried to ambulate with a [place] but had significant weakness in both lower extremities unable to take steps and admitted for further management. On admission not oriented to place person or time.  Consultation: Neurology  Subjective Patient seen and examined this morning Alert awake oriented tell me his name that she has [facility], able to move all his extremities Overnight patient has been afebrile BP  stable.  Labs unremarkable  Assessment and plan:  Acute metabolic encephalopathy: Deconditioning/impaired mobility/debility Peripheral neuropathy Worsening baseline right-sided weakness: Patient brought to the ED with worsening baseline right-sided weakness and some somnolence workup so far unremarkable no evidence of acute stroke ammonia normal evidence of infection.  UDS positive for barbiturates but he is on phenobarbital, Continue delirium precaution fall precaution, continue PT OT> recommending SNF. TOC consulted. Family has been trying for some time, he lives with 2 sisters and mother very weak at baseline.  Appreciate neurology input.  Seizure disorder: Unclear if he had seizure prior to admission and question if it is postictal he is on [place]) but seen by neurology continue home meds.  Continue seizure precaution Continue IV Keppra and Vimpat  History of stroke with residual right-sided weakness HLD: Continue aspirin statin  HTN: BP fairly controlled, continue Coreg amlodipine  COPD: Not in exacerbation continue bronchodilators  Anxiety/depression: PTA ON Klonopin 1MG  BID-resume at .5 mg twice daily as somnolence improved.  Holding Neurontin for now  Tobacco abuse: Advised cessation, continue nicotine patch

## 2023-11-23 NOTE — TOC Progression Note (Signed)
 Transition of Care [facility]) - Progression Note    Patient Details  Name: Seth [name] MRN: [id number] Date of Birth: Jun 07, 1956  Transition of Care [facility]) CM/SW Contact  Tien Aispuro A [place], LCSW Phone Number: 11/23/2023, 3:03 PM  Clinical Narrative:     CSW spoke with pt's daughter Kelton Pillar to provide bed offers with Medicare ratings. She said that she would look over facilities and speak with pt and pt's family members about making a decision for placement.    TOC will continue to follow.   Expected Discharge Plan: Skilled Nursing Facility Barriers to Discharge: Continued Medical Work up, SNF Pending bed offer, English as a second language teacher  Expected Discharge Plan and Services                                               Social Determinants of Health (SDOH) Interventions SDOH Screenings   Food Insecurity: No Food Insecurity (11/22/2023)  Housing: Low Risk  (11/22/2023)  Transportation Needs: No Transportation Needs (11/22/2023)  Utilities: Not At Risk (11/22/2023)  Depression (PHQ2-9): Low Risk  (08/23/2020)  Financial Resource Strain: Medium Risk (12/07/2017)  Social Connections: Unknown (11/22/2023)  Stress: Stress Concern Present (12/07/2017)  Tobacco Use: High Risk (11/21/2023)    Readmission Risk Interventions    12/22/2022    4:22 PM  Readmission Risk Prevention Plan  Transportation Screening Complete  HRI or Home Care Consult Complete  Social Work Consult for Recovery Care Planning/Counseling Complete  Palliative Care Screening Not Applicable  Medication Review Oceanographer) Complete

## 2023-11-23 NOTE — Progress Notes (Signed)
 Physical Therapy Treatment Patient Details Name: Seth [name] MRN: [id number] DOB: 08/30/1955 Today's Date: 11/23/2023   History of Present Illness Seth [name] is a 68 y.o. male  brought to ED 3/26 from home by as a code stroke. MRI brain did not show any acute intracranial abnormality, showed remote bilateral cerebellar infarcts and cerebral atrophy. PMH significant for HTN, stroke with right-sided residual weakness, seizure, COPD, BPH, depression. 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 He is still answering orientations incorrectly with the same wrong answers, but knows his name and location, recalled meeting me in the ED. Ambulates 2 bouts of 65 feet with RW and min assist for balance. Mod assist for transfer but improved to min A level with practice and heavy cues. Patient will continue to benefit from skilled physical therapy services to further improve independence with functional mobility.     If plan is discharge home, recommend the following: A little help with walking and/or transfers;A little help with bathing/dressing/bathroom;Assistance with cooking/housework;Direct supervision/assist for medications management;Direct supervision/assist for financial management;Assist for transportation;Help with stairs or ramp for entrance;Supervision due to cognitive status   Can travel by private vehicle     Yes  Equipment Recommendations  None recommended by PT    Recommendations for Other Services       Precautions / Restrictions Precautions Precautions: Fall Recall of Precautions/Restrictions: Impaired Restrictions Weight Bearing Restrictions Per Provider Order: No     Mobility  Bed Mobility Overal bed mobility: Needs Assistance Bed Mobility: Supine to Sit, Sit to Supine     Supine to sit: Min assist     General bed mobility comments: Min assist to patient to pull  through therapist's hand and rise.    Transfers Overall transfer level: Needs assistance Equipment used: Rolling Eich (2 wheels) Transfers: Sit to/from Stand Sit to Stand: Mod assist           General transfer comment: Mod assist for first trial to stand from bed, min assist on second trail. Reviewed LE placement, scooting closer to EOB, and hand placement prior to rising. RW for support upon standing, posterior lean.    Ambulation/Gait Ambulation/Gait assistance: Min assist Gait Distance (Feet): 65 Feet (x2) Assistive device: Rolling Vig (2 wheels) Gait Pattern/deviations: Step-through pattern, Decreased stride length, Trunk flexed, Narrow base of support, Leaning posteriorly, Staggering left Gait velocity: dec Gait velocity interpretation: <1.31 ft/sec, indicative of household ambulator   General Gait Details: Min assist for one episode of LOB while turning towards Rt side, good awareness but unable to self correct entirely. Completed 2 bouts, majority of distance at [facility] level while holding RW for support. Educated on safety, awareness, and techniques to maximize stability.   Stairs             Wheelchair Mobility     Tilt Bed    Modified Rankin (Stroke Patients Only)       Balance Overall balance assessment: Needs assistance Sitting-balance support: No upper extremity supported, Feet supported Sitting balance-[name] Scale: Good   Postural control: Posterior lean Standing balance support: Bilateral upper extremity supported Standing balance-[name] Scale: Poor Standing balance comment: posterior LOB                            Communication Communication Communication: Impaired Factors Affecting Communication: Reduced clarity of speech  Cognition Arousal: Alert Behavior During Therapy: [facility]  for tasks assessed/performed   PT - Cognitive impairments: No family/caregiver present to determine baseline, Orientation, Initiation, Problem solving    Orientation impairments: Time, Situation                   PT - Cognition Comments: Pt oriented to self and location but cannot recall his birth year, current month or year, situation. (answered questions incorrectly with same answers as yesterday.) Following commands: Impaired Following commands impaired: Only follows one step commands consistently, Follows one step commands with increased time    Cueing Cueing Techniques: Verbal cues, Gestural cues  Exercises General Exercises - Lower Extremity Ankle Circles/Pumps: AROM, Both, 10 reps, Seated Quad Sets: Strengthening, Both, 10 reps, Seated Gluteal Sets: Strengthening, Both, 10 reps, Seated    General Comments General comments (skin integrity, edema, etc.): More alert today but still confused      Pertinent Vitals/Pain Pain Assessment Pain Assessment: No/denies pain    Home Living                          Prior Function            PT Goals (current goals can now be found in the care plan section) Acute Rehab PT Goals Patient Stated Goal: Get stronger PT Goal Formulation: With patient Time For Goal Achievement: 12/06/23 Potential to Achieve Goals: Good Progress towards PT goals: Progressing toward goals    Frequency    Min 2X/week      PT Plan      Co-evaluation              AM-PAC PT "6 Clicks" Mobility   Outcome Measure  Help needed turning from your back to your side while in a flat bed without using bedrails?: None Help needed moving from lying on your back to sitting on the side of a flat bed without using bedrails?: A Little Help needed moving to and from a bed to a chair (including a wheelchair)?: A Lot Help needed standing up from a chair using your arms (e.g., wheelchair or bedside chair)?: A Little Help needed to walk in hospital room?: A Little Help needed climbing 3-5 steps with a railing? : A Lot 6 Click Score: 17    End of Session Equipment Utilized During Treatment:  Gait belt Activity Tolerance: Patient tolerated treatment well Patient left: with call bell/phone within reach;in chair;with chair alarm set   PT Visit Diagnosis: Unsteadiness on feet (R26.81);Other abnormalities of gait and mobility (R26.89);Hemiplegia and hemiparesis;Other symptoms and signs involving the nervous system (R29.898);Difficulty in walking, not elsewhere classified (R26.2);Muscle weakness (generalized) (M62.81) Hemiplegia - Right/Left: Right Hemiplegia - caused by: Unspecified     Time: 1137-1200 PT Time Calculation (min) (ACUTE ONLY): 23 min  Charges:    $Gait Training: 8-22 mins $Therapeutic Activity: 8-22 mins PT General Charges $$ ACUTE PT VISIT: 1 Visit                     Kathlyn Sacramento, PT, DPT [facility]  Rehabilitation Services Physical Therapist Office: [phone] Website: [email or web address]    Berton Mount 11/23/2023, 12:47 PM

## 2023-11-23 NOTE — Progress Notes (Addendum)
 PROGRESS NOTE Seth [name]  ZHY:[id number] DOB: 02/05/1956 DOA: 11/21/2023 PCP: Hillery Aldo, NP  Brief Narrative/Hospital Course: 68 y.o. male lives with his 2 sisters and mother at baseline very weak but he still tries to ambulate with a [place] and family trying to put him to facility, has PMH significant for HTN, stroke with right-sided residual weakness, seizure, COPD, BPH, depression, who was brought to ED on 11/21/23  from home by EMS last night as a code stroke. LKN 6 pm,  lost his balance while walking, fell and was unable to get up.He has baseline residual weakness in the right side since the stroke and after the falls he felt more weak on the right side.  Also noted to have drooling and slurred speech hence brought to the ED. In the ED underwent further workups, hemodynamically stable, labs fairly unremarkable UDS positive for barbiturates UA negative ammonia normal, ethanol less than 10. Underwent extensive imaging with CT head code stroke CT angio head and neck MRI brain without. MRI>remote cerebellar infarcts bilaterally, cerebral atrophy but no acute stroke. 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 Keppra Vimpat level sent.  Neurology was consulted since no acute stroke neurology did not recommend stroke workup. ED staff tried to ambulate with a [place] but had significant weakness in both lower extremities unable to take steps and admitted for further management. On admission not oriented to place person or time.  Consultation: Neurology  Subjective Patient seen and examined this morning Alert  awake oriented tell me his name that she has [facility], able to move all his extremities Overnight patient has been afebrile BP stable.  Labs unremarkable  Assessment and plan:  Acute metabolic encephalopathy: Deconditioning/impaired mobility/debility Peripheral neuropathy Worsening baseline right-sided weakness: Patient brought to the ED with worsening baseline right-sided weakness and some somnolence workup so far unremarkable no evidence of acute stroke ammonia normal evidence of infection.  UDS positive for barbiturates but he is on phenobarbital, Continue delirium precaution fall precaution, continue PT OT> recommending SNF. TOC consulted. Family has been trying for some time, he lives with 2 sisters and mother very weak at baseline.  Appreciate neurology input.  Seizure disorder: Unclear if he had seizure prior to admission and question if it is postictal he is on [place]) but seen by neurology continue home meds.  Continue seizure precaution Continue IV Keppra and Vimpat  History of stroke with residual right-sided weakness HLD: Continue aspirin statin  HTN: BP fairly controlled, continue Coreg amlodipine  COPD: Not in exacerbation continue bronchodilators  Anxiety/depression: PTA ON Klonopin 1MG  BID-resume at .5 mg twice daily as somnolence improved.  Holding Neurontin for now  Tobacco abuse: Advised cessation, continue nicotine patch    DVT prophylaxis: enoxaparin (LOVENOX) injection 40 mg Start: 11/23/23 1000 Code Status:   Code Status: Full Code Family Communication: plan of care discussed with patient/called to update his sister [phone] no answer, discussed plan with nursing staff> her sister called back and I updated her.  He is total care at home and wants rehab and hopefully long term.  By patient status is: Remains hospitalized because of severity of illness  Level of care: Med-Surg   Dispo: The patient is from: Home w/ sisters and uses  [facility] at baseline.            Anticipated disposition: Pending skilled nursing facility  Objective: Vitals last 24 hrs: Vitals:   11/22/23 1642 11/22/23 2025 11/23/23 0423 11/23/23 0820  BP: (!) 144/78 137/85 (!) 158/85 (!) 163/87  Pulse: (!) 58 80 64 67  Resp: 17 16 18 19   Temp: (!) 97.3 F (36.3 C) 97.7 F (36.5 C) 97.6 F (36.4 C) 98.4 F (36.9 C)  TempSrc: Oral Oral Oral Oral  SpO2: 100% 100% 100% 100%  Weight:      Height:       Weight change:   Physical Examination: General exam: alert awake, older than stated age HEENT:Oral mucosa moist, Ear/Nose WNL grossly Respiratory system: Bilaterally diminished BS, no use of accessory muscle Cardiovascular system: S1 & S2 +. Gastrointestinal system: Abdomen soft, NT,ND,BS+. Nervous System: Alert, awake,following commands. Extremities: LE edema neg, moving arms and legs, warm legs Skin: No rashes,warm. MSK: Normal muscle bulk/tone.   Medications reviewed:  Scheduled Meds:  amLODipine  10 mg Oral Daily   aspirin EC  81 mg Oral Daily   atorvastatin  40 mg Oral QPM   carvedilol  6.25 mg Oral BID WC   clonazePAM  0.5 mg Oral BID   enoxaparin (LOVENOX) injection  40 mg Subcutaneous Q24H   gabapentin  300 mg Oral BID   pantoprazole  40 mg Oral Daily   PHENobarbital  64.8 mg Oral BID   Continuous Infusions:  sodium chloride 75 mL/hr at 11/23/23 0624   lacosamide (VIMPAT) IV Stopped (11/22/23 2234)   levETIRAcetam Stopped (11/22/23 2310)     Diet Order             Diet Heart Room service appropriate? Yes; Fluid consistency: Thin  Diet effective now                  Intake/Output Summary (Last 24 hours) at 11/23/2023 1058 Last data filed at 11/23/2023 4098 Gross per 24 hour  Intake 2046.49 ml  Output 2075 ml  Net -28.51 ml   Net IO Since Admission: 1,566.08 mL [11/23/23 1058]  Wt Readings from Last 3 Encounters:  11/21/23 73.8 kg  10/09/23 81.6 kg  06/09/23 76.8 kg     Unresulted Labs (From admission,  onward)     Start     Ordered   11/21/23 2219  Levetiracetam level  Once,   URGENT        11/21/23 2218   11/21/23 2219  Lacosamide  Once,   URGENT        11/21/23 2218          Data Reviewed: I have personally reviewed following labs and imaging studies ( see epic result tab) CBC: Recent Labs  Lab 11/21/23 2245 11/21/23 2313 11/23/23 0831  WBC 6.3  --  5.4  NEUTROABS 2.7  --   --   HGB 14.8 17.0 14.8  HCT 47.9 50.0 45.3  MCV 89.2  --  86.6  PLT 216  --  200   CMP: Recent Labs  Lab 11/21/23 2245 11/21/23 2313 11/23/23 0831  NA 139 140 138  K 4.1 4.3 4.2  CL 105 106 105  CO2 23  --  24  GLUCOSE 81 83 80  BUN 14 15 14   CREATININE 1.03 1.00 0.97  CALCIUM 9.2  --  8.8*   GFR: Estimated Creatinine Clearance: 78.2  mL/min (by C-G formula based on SCr of 0.97 mg/dL). Recent Labs  Lab 11/21/23 2245  AST 27  ALT 23  ALKPHOS 166*  BILITOT 0.5  PROT 7.4  ALBUMIN 3.7   No results for input(s): "LIPASE", "AMYLASE" in the last 168 hours.  Recent Labs  Lab 11/22/23 0631  AMMONIA 33   Coagulation Profile:  Recent Labs  Lab 11/21/23 2245  INR 1.1   Recent Labs  Lab 11/21/23 2156  GLUCAP 78  Antimicrobials/Microbiology: Anti-infectives (From admission, onward)    None         Component Value Date/Time   SDES BLOOD LEFT FOREARM 05/22/2022 1952   SPECREQUEST  05/22/2022 1952    BOTTLES DRAWN AEROBIC AND ANAEROBIC Blood Culture adequate volume   CULT  05/22/2022 1952    NO GROWTH 5 DAYS Performed at [facility], [address], [place], [place] [address]    REPTSTATUS 05/27/2022 FINAL 05/22/2022 1952     Radiology Studies: MR BRAIN WO CONTRAST Result Date: 11/22/2023 CLINICAL DATA:  Neuro deficit, acute, stroke suspected EXAM: MRI HEAD WITHOUT CONTRAST TECHNIQUE: Multiplanar, multiecho pulse sequences of the brain and surrounding structures were obtained without intravenous contrast. COMPARISON:  MRI head June 09, 2023. FINDINGS: Brain: No  acute infarction, acute hemorrhage, hydrocephalus, extra-axial collection or intraparenchymal mass lesion. Remote cerebellar infarcts bilaterally. Similar ex vacuo ventriculomegaly. Similar hemosiderin along the left cerebral convexity. 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 Vascular: Major arterial flow voids are maintained at the skull base. Skull and upper cervical spine: Normal marrow signal. Sinuses/Orbits: Mild paranasal sinus mucosal thickening. No acute orbital findings. Other: No mastoid effusions. IMPRESSION: 1. No evidence of acute intracranial abnormality. 2. Remote cerebellar infarcts bilaterally. 3.  Cerebral Atrophy (ICD10-G31.9). Electronically Signed   By: Feliberto Harts M.D.   On: 11/22/2023 02:56   CT ANGIO HEAD NECK W WO CM (CODE STROKE) Result Date: 11/21/2023 CLINICAL DATA:  Neuro deficit, acute, stroke suspected EXAM: CT ANGIOGRAPHY HEAD AND NECK WITH AND WITHOUT CONTRAST TECHNIQUE: Multidetector CT imaging of the head and neck was performed using the standard protocol during bolus administration of intravenous contrast. Multiplanar CT image reconstructions and MIPs were obtained to evaluate the vascular anatomy. Carotid stenosis measurements (when applicable) are obtained utilizing NASCET criteria, using the distal internal carotid diameter as the denominator. RADIATION DOSE REDUCTION: This exam was performed according to the departmental dose-optimization program which includes automated exposure control, adjustment of the mA and/or kV according to patient size and/or use of iterative reconstruction technique. CONTRAST:  75mL OMNIPAQUE IOHEXOL 350 MG/ML SOLN COMPARISON:  CTA head/neck October 12 24. FINDINGS: CTA NECK FINDINGS Aortic arch: Great vessel origins are patent without significant stenosis. Aortic atherosclerosis. Right carotid system: No evidence of significant stenosis (50% or greater), or occlusion. 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  Left carotid system: Patent. 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 The ICA pseudoaneurysm is more thrombosed than on the prior. Vertebral arteries: Codominant. No evidence of dissection, stenosis (50% or greater), or occlusion. Chronically small. Skeleton: No acute abnormality on limited assessment. Other neck: No acute abnormality on limited assessment. Upper chest: Emphysema. Review of the MIP images confirms the above findings CTA HEAD FINDINGS Anterior circulation: Bilateral intracranial ICAs, MCAs, and bilateral ACAs are patent without proximal hemodynamically significant stenosis. Posterior circulation: Bilateral intradural vertebral arteries, basilar artery and bilateral posterior cerebral arteries are patent without proximal hemodynamically significant stenosis.  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 Venous sinuses: As permitted by contrast timing, patent. Anatomic variants: Detailed above. Review of the MIP images confirms the above findings IMPRESSION: 1. No emergent large vessel occlusion or proximal hemodynamically significant stenosis intracranially. 2. 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 3. Aortic Atherosclerosis (ICD10-I70.0) and Emphysema (ICD10-J43.9). Electronically Signed   By: Feliberto Harts M.D.   On: 11/21/2023 22:45   CT HEAD CODE STROKE WO CONTRAST Result Date: 11/21/2023 CLINICAL DATA:  Code stroke.  Neuro deficit, acute, stroke suspected EXAM: CT HEAD WITHOUT CONTRAST TECHNIQUE: Contiguous axial images were obtained from the base of the skull through the vertex without intravenous contrast. RADIATION DOSE REDUCTION: This exam was performed according to the departmental  dose-optimization program which includes automated exposure control, adjustment of the mA and/or kV according to patient size and/or use of iterative reconstruction technique. COMPARISON:  CT head October 09, 2023. FINDINGS: Brain: Remote cerebellar infarcts. Patchy white matter hypodensities, compatible with chronic microvascular ischemic disease. No evidence of acute large vascular territory infarct, acute hemorrhage or mass lesion. Cerebral atrophy. No hydrocephalus. Vascular: No hyperdense vessel. Skull: No acute fracture. Sinuses/Orbits: Clear sinuses.  No acute orbital findings. Other: No mastoid effusions. ASPECTS [facility] Stroke Program Early CT Score) Total score (0-10 with 10 being normal): 10. IMPRESSION: 1. No evidence of acute intracranial abnormality. 2. ASPECTS is 10. Code stroke imaging results were communicated on 11/21/2023 at 10:30 pm to provider Dr. Derry Lory Via secure text paging. Electronically Signed   By: Feliberto Harts M.D.   On: 11/21/2023 22:32    LOS: 1 day   Total time spent in review of labs and imaging, patient evaluation, formulation of plan, documentation and communication with patient/family: 35 minutes  Lanae Boast, MD [facility] 11/23/2023, 10:58 AM

## 2023-11-23 NOTE — Plan of Care (Signed)

## 2023-11-24 DIAGNOSIS — R531 Weakness: Secondary | ICD-10-CM | POA: Diagnosis not present

## 2023-11-24 MED ORDER — LACOSAMIDE 200 MG PO TABS
100.0000 mg | ORAL_TABLET | Freq: Two times a day (BID) | ORAL | Status: DC
  Administered 2023-11-24 – 2023-11-26 (×5): 100 mg via ORAL
  Filled 2023-11-24 (×5): qty 1

## 2023-11-24 MED ORDER — LEVETIRACETAM ER 500 MG PO TB24
2000.0000 mg | ORAL_TABLET | Freq: Two times a day (BID) | ORAL | Status: DC
  Administered 2023-11-24 – 2023-11-26 (×5): 2000 mg via ORAL
  Filled 2023-11-24 (×6): qty 4

## 2023-11-24 NOTE — Plan of Care (Signed)

## 2023-11-24 NOTE — TOC Progression Note (Signed)
 Transition of Care [facility]) - Progression Note    Patient Details  Name: Seth [name] MRN: [id number] Date of Birth: 04-11-1956  [facility]) CM/SW Contact  Dellie Burns [place], [place] Phone Number: 11/24/2023, 11:47 AM  Clinical Narrative:  Spoke to pt's dtr [name] re SNF choice, she has accepted [place]. Confirmed bed with [name] at [facility]. [facility] is currently pending, reference [id number]. Will provide updates as available.   Dellie Burns, MSW, LCSW [phone] (coverage)       Expected Discharge Plan: Skilled Nursing Facility Barriers to Discharge: Continued Medical Work up, SNF Pending bed offer, Insurance Authorization  Expected Discharge Plan and Services                                               Social Determinants of Health (SDOH) Interventions SDOH Screenings   Food Insecurity: No Food Insecurity (11/22/2023)  Housing: Low Risk  (11/22/2023)  Transportation Needs: No Transportation Needs (11/22/2023)  Utilities: Not At Risk (11/22/2023)  Depression (PHQ2-9): Low Risk  (08/23/2020)  Financial Resource Strain: Medium Risk (12/07/2017)  Social Connections: Unknown (11/22/2023)  Stress: Stress Concern Present (12/07/2017)  Tobacco Use: High Risk (11/21/2023)    Readmission Risk Interventions    12/22/2022    4:22 PM  Readmission Risk Prevention Plan  Transportation Screening Complete  HRI or Home Care Consult Complete  Social Work Consult for Recovery Care Planning/Counseling Complete  Palliative Care Screening Not Applicable  Medication Review [employer]) Complete

## 2023-11-24 NOTE — Progress Notes (Signed)
 PROGRESS NOTE    Seth JARRIEL  [name]:[id number] DOB: October 18, 1955 DOA: 11/21/2023 PCP: Hillery Aldo, NP   Brief Narrative:  68 y.o. male lives with his 2 sisters and mother at baseline very weak but he still tries to ambulate with a H[place] and family trying to put him to facility, has PMH significant for HTN, stroke with right-sided residual weakness, seizure, COPD, BPH, depression, who was brought to ED on 11/21/23  from home by EMS last night as a code stroke. LKN 6 pm,  lost his balance while walking, fell and was unable to get up.He has baseline residual weakness in the right side since the stroke and after the falls he felt more weak on the right side.  Also noted to have drooling and slurred speech hence brought to the ED. In the ED underwent further workups, hemodynamically stable, labs fairly unremarkable UDS positive for barbiturates UA negative ammonia normal, ethanol less than 10. Underwent extensive imaging with CT head code stroke CT angio head and neck MRI brain without. MRI>remote cerebellar infarcts bilaterally, cerebral atrophy but no acute stroke. 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 Keppra Vimpat level sent.  Neurology was consulted since no acute stroke neurology did not recommend stroke workup. ED staff tried to ambulate with a Nellums but had significant weakness in both lower extremities unable to take steps and admitted for further management. On admission not oriented to place person or time.  Assessment & Plan:   Principal Problem:   Generalized weakness Active Problems:   General  weakness  Acute metabolic encephalopathy: Deconditioning/impaired mobility/debility Peripheral neuropathy Worsening baseline right-sided weakness: Patient brought to the ED with worsening baseline right-sided weakness and some somnolence. Workup so far unremarkable, no evidence of acute stroke, ammonia normal, no evidence of infection.  UDS positive for barbiturates but he is on phenobarbital.  Patient is fully alert and oriented, back to normal/baseline.  Able to converse.  Answering questions appropriately. he lives with 2 sisters and mother very weak at baseline.  Appreciate neurology input.  Seen by PT OT, SNF recommended, TOC on board, awaiting insurance authorization.   Seizure disorder: Unclear if he had seizure prior to admission and question if it is postictal he is on Pelbreton) but seen by neurology, home medications continued.  He is on IV antiseizure medications but he is able to take p.o. medications and able to tolerate p.o. food, I will transition all medications to p.o.  He is on high-dose of Keppra 2000 mg twice daily and his Keppra level was very elevated.  Wondering if his presentation/somnolence since was secondary to overdose of Keppra so I discussed with Dr. Iver Nestle of neurology however she recommended continuing current dose of Keppra as well as other home antiseizure medications which is phenobarbital and Vimpat.   History of stroke with residual right-sided weakness HLD: Continue aspirin statin   HTN: BP fairly controlled, continue Coreg amlodipine   COPD: Not in exacerbation continue bronchodilators   Anxiety/depression: PTA ON Klonopin 1MG  BID-resume at .5 mg twice daily as somnolence improved.  Holding Neurontin for now   Tobacco abuse: I have discussed tobacco cessation with the  patient.  I have counseled the patient regarding the negative impacts of continued tobacco use including but not limited to lung cancer, COPD, and cardiovascular disease.  I have  discussed alternatives to tobacco and modalities that may help facilitate tobacco cessation including but not limited to biofeedback, hypnosis, and medications.  Total time spent with tobacco counseling was 5 minutes.   DVT prophylaxis: enoxaparin (LOVENOX) injection 40 mg Start: 11/23/23 1000   Code Status: Full Code  Family Communication:  None present at bedside.  Plan of care discussed with patient in length and he/she verbalized understanding and agreed with it.  Status is: Inpatient Remains inpatient appropriate because: Medically stable, pending placement.   Estimated body mass index is 20.34 kg/m as calculated from the following:   Height as of this encounter: 6\' 3"  (1.905 m).   Weight as of this encounter: 73.8 kg.    Nutritional Assessment: Body mass index is 20.34 kg/m.Marland Kitchen Seen by dietician.  I agree with the assessment and plan as outlined below: Nutrition Status:        . Skin Assessment: I have examined the patient's skin and I agree with the wound assessment as performed by the wound care RN as outlined below:    Consultants:  Neurology-signed off  Procedures:  None  Antimicrobials:  Anti-infectives (From admission, onward)    None         Subjective: Patient seen and examined, fully alert and oriented.  No complaints.  Nurse at the bedside.  Objective: Vitals:   11/23/23 1943 11/24/23 0000 11/24/23 0423 11/24/23 0749  BP: (!) 121/94 (!) 146/78 (!) 147/78 (!) 165/88  Pulse: 69 64 62 63  Resp: 17 18 18    Temp: 97.6 F (36.4 C) 98.2 F (36.8 C) (!) 97.5 F (36.4 C) 97.7 F (36.5 C)  TempSrc: Oral Oral Oral Oral  SpO2: 100% 98% 99% 100%  Weight:      Height:        Intake/Output Summary (Last 24 hours) at 11/24/2023 1058 Last data filed at 11/24/2023 0946 Gross per 24 hour  Intake 118 ml  Output 3400 ml  Net -3282 ml   Filed Weights   11/21/23 2100 11/21/23 2230  Weight: 73.8 kg 73.8 kg    Examination:  General exam: Appears calm  and comfortable  Respiratory system: Clear to auscultation. Respiratory effort normal. Cardiovascular system: S1 & S2 heard, RRR. No JVD, murmurs, rubs, gallops or clicks. No pedal edema. Gastrointestinal system: Abdomen is nondistended, soft and nontender. No organomegaly or masses felt. Normal bowel sounds heard. Central nervous system: Alert and oriented. No focal neurological deficits. Extremities: Symmetric 5 x 5 power. Skin: No rashes, lesions or ulcers Psychiatry: Judgement and insight appear normal. Mood & affect appropriate.    Data Reviewed: I have personally reviewed following labs and imaging studies  CBC: Recent Labs  Lab 11/21/23 2245 11/21/23 2313 11/23/23 0831  WBC 6.3  --  5.4  NEUTROABS 2.7  --   --   HGB 14.8 17.0 14.8  HCT 47.9 50.0 45.3  MCV 89.2  --  86.6  PLT 216  --  200   Basic Metabolic Panel: Recent Labs  Lab 11/21/23 2245 11/21/23 2313 11/23/23 0831  NA 139 140 138  K 4.1 4.3 4.2  CL 105 106 105  CO2 23  --  24  GLUCOSE 81 83 80  BUN 14 15 14   CREATININE 1.03 1.00 0.97  CALCIUM 9.2  --  8.8*   GFR: Estimated  Creatinine Clearance: 78.2 mL/min (by C-G formula based on SCr of 0.97 mg/dL). Liver Function Tests: Recent Labs  Lab 11/21/23 2245  AST 27  ALT 23  ALKPHOS 166*  BILITOT 0.5  PROT 7.4  ALBUMIN 3.7   No results for input(s): "LIPASE", "AMYLASE" in the last 168 hours. Recent Labs  Lab 11/22/23 0631  AMMONIA 33   Coagulation Profile: Recent Labs  Lab 11/21/23 2245  INR 1.1   Cardiac Enzymes: No results for input(s): "CKTOTAL", "CKMB", "CKMBINDEX", "TROPONINI" in the last 168 hours. BNP (last 3 results) No results for input(s): "PROBNP" in the last 8760 hours. HbA1C: No results for input(s): "HGBA1C" in the last 72 hours. CBG: Recent Labs  Lab 11/21/23 2156  GLUCAP 78   Lipid Profile: No results for input(s): "CHOL", "HDL", "LDLCALC", "TRIG", "CHOLHDL", "LDLDIRECT" in the last 72 hours. Thyroid Function  Tests: No results for input(s): "TSH", "T4TOTAL", "FREET4", "T3FREE", "THYROIDAB" in the last 72 hours. Anemia Panel: No results for input(s): "VITAMINB12", "FOLATE", "FERRITIN", "TIBC", "IRON", "RETICCTPCT" in the last 72 hours. Sepsis Labs: No results for input(s): "PROCALCITON", "LATICACIDVEN" in the last 168 hours.  No results found for this or any previous visit (from the past 240 hours).   Radiology Studies: No results found.  Scheduled Meds:  amLODipine  10 mg Oral Daily   aspirin EC  81 mg Oral Daily   atorvastatin  40 mg Oral QPM   carvedilol  6.25 mg Oral BID WC   clonazePAM  0.5 mg Oral BID   enoxaparin (LOVENOX) injection  40 mg Subcutaneous Q24H   gabapentin  300 mg Oral BID   lacosamide  100 mg Oral BID   levETIRAcetam  2,000 mg Oral BID   pantoprazole  40 mg Oral Daily   PHENobarbital  64.8 mg Oral BID   Continuous Infusions:   LOS: 2 days   Hughie Closs, MD [facility]  11/24/2023, 10:58 AM   *Please note that this is a verbal dictation therefore any spelling or grammatical errors are due to the "Dragon Medical One" system interpretation.  Please page via Amion and do not message via secure chat for urgent patient care matters. Secure chat can be used for non urgent patient care matters.  How to contact the [facility] Attending or Consulting provider 7A - 7P or covering provider during after hours 7P -7A, for this patient?  Check the care team in [facility] and look for a) attending/consulting TRH provider listed and b) the [facility] team listed. Page or secure chat 7A-7P. Log into [email or web address] and use [place]'s universal password to access. If you do not have the password, please contact the hospital operator. Locate the [facility] provider you are looking for under [facility] and page to a number that you can be directly reached. If you still have difficulty reaching the provider, please page the [facility] (Director on Call) for the Hospitalists listed on amion for  assistance.

## 2023-11-25 DIAGNOSIS — R531 Weakness: Secondary | ICD-10-CM | POA: Diagnosis not present

## 2023-11-25 DIAGNOSIS — Z720 Tobacco use: Secondary | ICD-10-CM

## 2023-11-25 LAB — LACOSAMIDE: Lacosamide: 9 ug/mL (ref 5.0–10.0)

## 2023-11-25 MED ORDER — LISINOPRIL 20 MG PO TABS
20.0000 mg | ORAL_TABLET | Freq: Every day | ORAL | Status: DC
  Administered 2023-11-25 – 2023-11-26 (×2): 20 mg via ORAL
  Filled 2023-11-25 (×2): qty 1

## 2023-11-25 MED ORDER — CLONAZEPAM 0.5 MG PO TABS: 0.5000 mg | ORAL_TABLET | Freq: Two times a day (BID) | ORAL | 0 refills | Status: DC

## 2023-11-25 NOTE — Progress Notes (Signed)
 PROGRESS NOTE    Seth [name]  EAV:[id number] DOB: Jul 22, 1956 DOA: 11/21/2023 PCP: Hillery Aldo, NP   Brief Narrative:  68 y.o. male lives with his 2 sisters and mother at baseline very weak but he still tries to ambulate with a Garno and family trying to put him to facility, has PMH significant for HTN, stroke with right-sided residual weakness, seizure, COPD, BPH, depression, who was brought to ED on 11/21/23  from home by EMS last night as a code stroke. LKN 6 pm,  lost his balance while walking, fell and was unable to get up.He has baseline residual weakness in the right side since the stroke and after the falls he felt more weak on the right side.  Also noted to have drooling and slurred speech hence brought to the ED. In the ED underwent further workups, hemodynamically stable, labs fairly unremarkable UDS positive for barbiturates UA negative ammonia normal, ethanol less than 10. Underwent extensive imaging with CT head code stroke CT angio head and neck MRI brain without. MRI>remote cerebellar infarcts bilaterally, cerebral atrophy but no acute stroke. 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 Keppra Vimpat level sent.  Neurology was consulted since no acute stroke neurology did not recommend stroke workup. ED staff tried to ambulate with a Bracco but had significant weakness in both lower extremities unable to take steps and admitted for further management. On admission not oriented to place person or time.  Assessment & Plan:   Principal Problem:   Generalized weakness Active Problems:   General  weakness  Acute metabolic encephalopathy: Deconditioning/impaired mobility/debility Peripheral neuropathy Worsening baseline right-sided weakness: Patient brought to the ED with worsening baseline right-sided weakness and some somnolence. Workup so far unremarkable, no evidence of acute stroke, ammonia normal, no evidence of infection.  UDS positive for barbiturates but he is on phenobarbital.  Patient is fully alert and oriented, back to normal/baseline.  Able to converse.  Answering questions appropriately. he lives with 2 sisters and mother very weak at baseline.  Appreciate neurology input.  Seen by PT OT, SNF recommended, TOC on board, insurance authorization obtained as well however per TOC, facility does not have a bed for him until tomorrow morning.   Seizure disorder: Unclear if he had seizure prior to admission and question if it is postictal he is on Pelbreton) but seen by neurology, home medications continued.  He is on IV antiseizure medications but he is able to take p.o. medications and able to tolerate p.o. food, I will transition all medications to p.o.  He is on high-dose of Keppra 2000 mg twice daily and his Keppra level was very elevated.  I wonder if his presentation/somnolence since was secondary to overdose of Keppra so I discussed with Dr. Iver Nestle of neurology however she recommended continuing current dose of Keppra as well as other home antiseizure medications which is phenobarbital and Vimpat.   History of stroke with residual right-sided weakness HLD: Continue aspirin statin   HTN: Blood pressure slightly elevated, continue Coreg amlodipine, and lisinopril 20 mg.   COPD: Not in exacerbation continue bronchodilators   Anxiety/depression: PTA ON Klonopin 1MG  BID-resume at .5  mg twice daily as somnolence improved.  Holding Neurontin for now   Tobacco abuse: I have discussed tobacco cessation with the patient.  I have counseled the patient regarding the negative  impacts of continued tobacco use including but not limited to lung cancer, COPD, and cardiovascular disease.  I have discussed alternatives to tobacco and modalities that may help facilitate tobacco cessation including but not limited to biofeedback, hypnosis, and medications.  Total time spent with tobacco counseling was 5 minutes.   DVT prophylaxis: enoxaparin (LOVENOX) injection 40 mg Start: 11/23/23 1000   Code Status: Full Code  Family Communication:  None present at bedside.  Plan of care discussed with patient in length and he/she verbalized understanding and agreed with it.  Status is: Inpatient Remains inpatient appropriate because: Medically stable, pending placement.   Estimated body mass index is 20.34 kg/m as calculated from the following:   Height as of this encounter: 6\' 3"  (1.905 m).   Weight as of this encounter: 73.8 kg.    Nutritional Assessment: Body mass index is 20.34 kg/m.Marland Kitchen Seen by dietician.  I agree with the assessment and plan as outlined below: Nutrition Status:        . Skin Assessment: I have examined the patient's skin and I agree with the wound assessment as performed by the wound care RN as outlined below:    Consultants:  Neurology-signed off  Procedures:  None  Antimicrobials:  Anti-infectives (From admission, onward)    None         Subjective: Seen and examined.  No complaints.  Fully alert and oriented and aware of the plan.  Objective: Vitals:   11/24/23 2025 11/25/23 0429 11/25/23 0906 11/25/23 0934  BP: (!) 148/92 (!) 148/87 (!) 154/89 (!) 164/94  Pulse: 83 71 62 60  Resp: 18 16 16 17   Temp: 98 F (36.7 C) 97.9 F (36.6 C) (!) 97.3 F (36.3 C) (!) 97.4 F (36.3 C)  TempSrc: Oral Oral Oral Oral  SpO2: 99% 98% 99% 99%  Weight:      Height:        Intake/Output Summary (Last 24 hours) at 11/25/2023 1025 Last data filed at 11/25/2023 2440 Gross per 24 hour  Intake 120 ml  Output 2075 ml  Net -1955 ml   Filed  Weights   11/21/23 2100 11/21/23 2230  Weight: 73.8 kg 73.8 kg    Examination:  General exam: Appears calm and comfortable  Respiratory system: Clear to auscultation. Respiratory effort normal. Cardiovascular system: S1 & S2 heard, RRR. No JVD, murmurs, rubs, gallops or clicks. No pedal edema. Gastrointestinal system: Abdomen is nondistended, soft and nontender. No organomegaly or masses felt. Normal bowel sounds heard. Central nervous system: Alert and oriented. No focal neurological deficits. Extremities: Symmetric 5 x 5 power. Skin: No rashes, lesions or ulcers.  Psychiatry: Judgement and insight appear normal. Mood & affect appropriate.    Data Reviewed: I have personally reviewed following labs and imaging studies  CBC: Recent Labs  Lab 11/21/23 2245 11/21/23 2313 11/23/23 0831  WBC 6.3  --  5.4  NEUTROABS 2.7  --   --   HGB 14.8 17.0 14.8  HCT 47.9 50.0 45.3  MCV 89.2  --  86.6  PLT 216  --  200   Basic Metabolic Panel: Recent Labs  Lab 11/21/23 2245 11/21/23 2313 11/23/23 0831  NA 139 140 138  K 4.1 4.3 4.2  CL 105 106 105  CO2 23  --  24  GLUCOSE  81 83 80  BUN 14 15 14   CREATININE 1.03 1.00 0.97  CALCIUM 9.2  --  8.8*   GFR: Estimated Creatinine Clearance: 78.2 mL/min (by C-G formula based on SCr of 0.97 mg/dL). Liver Function Tests: Recent Labs  Lab 11/21/23 2245  AST 27  ALT 23  ALKPHOS 166*  BILITOT 0.5  PROT 7.4  ALBUMIN 3.7   No results for input(s): "LIPASE", "AMYLASE" in the last 168 hours. Recent Labs  Lab 11/22/23 0631  AMMONIA 33   Coagulation Profile: Recent Labs  Lab 11/21/23 2245  INR 1.1   Cardiac Enzymes: No results for input(s): "CKTOTAL", "CKMB", "CKMBINDEX", "TROPONINI" in the last 168 hours. BNP (last 3 results) No results for input(s): "PROBNP" in the last 8760 hours. HbA1C: No results for input(s): "HGBA1C" in the last 72 hours. CBG: Recent Labs  Lab 11/21/23 2156  GLUCAP 78   Lipid Profile: No results  for input(s): "CHOL", "HDL", "LDLCALC", "TRIG", "CHOLHDL", "LDLDIRECT" in the last 72 hours. Thyroid Function Tests: No results for input(s): "TSH", "T4TOTAL", "FREET4", "T3FREE", "THYROIDAB" in the last 72 hours. Anemia Panel: No results for input(s): "VITAMINB12", "FOLATE", "FERRITIN", "TIBC", "IRON", "RETICCTPCT" in the last 72 hours. Sepsis Labs: No results for input(s): "PROCALCITON", "LATICACIDVEN" in the last 168 hours.  No results found for this or any previous visit (from the past 240 hours).   Radiology Studies: No results found.  Scheduled Meds:  amLODipine  10 mg Oral Daily   aspirin EC  81 mg Oral Daily   atorvastatin  40 mg Oral QPM   carvedilol  6.25 mg Oral BID WC   clonazePAM  0.5 mg Oral BID   enoxaparin (LOVENOX) injection  40 mg Subcutaneous Q24H   gabapentin  300 mg Oral BID   lacosamide  100 mg Oral BID   levETIRAcetam  2,000 mg Oral BID   lisinopril  20 mg Oral Daily   pantoprazole  40 mg Oral Daily   PHENobarbital  64.8 mg Oral BID   Continuous Infusions:   LOS: 3 days   Hughie Closs, MD [facility]  11/25/2023, 10:25 AM   *Please note that this is a verbal dictation therefore any spelling or grammatical errors are due to the "Dragon Medical One" system interpretation.  Please page via Amion and do not message via secure chat for urgent patient care matters. Secure chat can be used for non urgent patient care matters.  How to contact the [facility] Attending or Consulting provider 7A - 7P or covering provider during after hours 7P -7A, for this patient?  Check the care team in [facility] and look for a) attending/consulting TRH provider listed and b) the [facility] team listed. Page or secure chat 7A-7P. Log into [email or web address] and use [place]'s universal password to access. If you do not have the password, please contact the hospital operator. Locate the [facility] provider you are looking for under [facility] and page to a number that you can be directly  reached. If you still have difficulty reaching the provider, please page the [facility] (Director on Call) for the Hospitalists listed on amion for assistance.

## 2023-11-25 NOTE — Plan of Care (Signed)

## 2023-11-25 NOTE — TOC Progression Note (Signed)
 Transition of Care [facility]) - Progression Note    Patient Details  Name: Seth [name] MRN: [id number] Date of Birth: [birth year]  [facility] CM/SW Contact  Dellie Burns [place], [place] Phone Number: 11/25/2023, 9:48 AM  Clinical Narrative:  Home and Community/[employer] auth received: [id number], valid 3/29-4/1. Updated [name] at [facility] who reports they are not able to accept pt until Monday. MD updated. SW will follow.   Dellie Burns, MSW, LCSW [phone] (coverage)       Expected Discharge Plan: Skilled Nursing Facility Barriers to Discharge: Continued Medical Work up, SNF Pending bed offer, Insurance Authorization  Expected Discharge Plan and Services                                               Social Determinants of Health (SDOH) Interventions SDOH Screenings   Food Insecurity: No Food Insecurity (11/22/2023)  Housing: Low Risk  (11/22/2023)  Transportation Needs: No Transportation Needs (11/22/2023)  Utilities: Not At Risk (11/22/2023)  Depression (PHQ2-9): Low Risk  (08/23/2020)  Financial Resource Strain: Medium Risk (12/07/2017)  Social Connections: Unknown (11/22/2023)  Stress: Stress Concern Present (12/07/2017)  Tobacco Use: High Risk (11/21/2023)    Readmission Risk Interventions    12/22/2022    4:22 PM  Readmission Risk Prevention Plan  Transportation Screening Complete  HRI or Home Care Consult Complete  Social Work Consult for Recovery Care Planning/Counseling Complete  Palliative Care Screening Not Applicable  Medication Review Ocean[employer]) Complete

## 2023-11-26 DIAGNOSIS — R531 Weakness: Secondary | ICD-10-CM | POA: Diagnosis not present

## 2023-11-26 MED ORDER — LISINOPRIL 10 MG PO TABS: 10.0000 mg | ORAL_TABLET | Freq: Every day | ORAL | 0 refills | Status: DC

## 2023-11-26 NOTE — Progress Notes (Signed)
 Patient being discharged to SNF. Report called to [facility]. Patient being taken to discharge lounge. All belongings left with patient.

## 2023-11-26 NOTE — Discharge Summary (Signed)
 Physician Discharge Summary  Seth [name] HYQ:[id number] DOB: October 25, 1955 DOA: 11/21/2023  PCP: Hillery Aldo, NP  Admit date: 11/21/2023 Discharge date: 11/26/2023 30 Day Unplanned Readmission Risk Score    Flowsheet Row ED to Hosp-Admission (Current) from 11/21/2023 in [facility]  30 Day Unplanned Readmission Risk Score (%) 23.3 Filed at 11/26/2023 0801       This score is the patient's risk of an unplanned readmission within 30 days of being discharged (0 -100%). The score is based on dignosis, age, lab data, medications, orders, and past utilization.   Low:  0-14.9   Medium: 15-21.9   High: 22-29.9   Extreme: 30 and above          Admitted From: Home Disposition: SNF  Recommendations for Outpatient Follow-up:  Follow up with PCP in 1-2 weeks Please obtain BMP/CBC in one week Please follow up with your PCP on the following pending results: Unresulted Labs (From admission, onward)    None         Home Health: None Equipment/Devices: None  Discharge Condition: Stable CODE STATUS: Full code Diet recommendation: Cardiac  Subjective: Seen and examined.  No complaints.  Remains stable and symptom-free.  Brief/Interim Summary: 68 y.o. male lives with his 2 sisters and mother at baseline very weak but he still tries to ambulate with a Swann and family trying to put him to facility, has PMH significant for HTN, stroke with right-sided residual weakness, seizure, COPD, BPH, depression, who was brought to ED on 11/21/23  from home by EMS as a code stroke. LKN 6 pm,  lost his balance while walking, fell and was unable to get up.He has baseline residual weakness in the right side since the stroke and after the falls he felt more weak on the right side.  Also noted to have drooling and slurred speech hence brought to the ED. In the ED underwent further workups, hemodynamically stable, labs fairly unremarkable UDS positive for barbiturates UA negative ammonia  normal, ethanol less than 10. Underwent extensive imaging with CT head code stroke CT angio head and neck MRI brain without. MRI>remote cerebellar infarcts bilaterally, cerebral atrophy but no acute stroke. 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 Keppra Vimpat level sent.  Neurology was consulted since no acute stroke neurology did not recommend stroke workup. ED staff tried to ambulate with a Lilja but had significant weakness in both lower extremities unable to take steps and admitted for further management. On admission not oriented to place person or time.  Details of hospitalization as below.   Acute metabolic encephalopathy: Deconditioning/impaired mobility/debility Peripheral neuropathy Worsening baseline right-sided weakness: Patient brought to the ED with worsening baseline right-sided weakness and some somnolence. Workup so far unremarkable, no evidence of acute stroke, ammonia normal, no evidence of infection.  UDS positive for barbiturates but he is on phenobarbital.  Patient is fully alert and oriented, back to normal/baseline.  Able to converse.  Answering questions appropriately. he lives with 2 sisters and mother very weak at baseline.  Appreciate neurology input.  Seen by PT OT, SNF recommended, TOC on board, patient is being discharged in  stable condition to SNF today.   Seizure disorder: seen by neurology, home medications continued.  He is on high-dose of Keppra 2000 mg twice daily and his Keppra level was very elevated.  I wondered if his presentation/somnolence was secondary to overdose of  Keppra? so I discussed with Dr. Iver Nestle of neurology however she recommended continuing current dose of Keppra as well as other home antiseizure medications which is phenobarbital and Vimpat.   History of stroke with residual right-sided weakness HLD: Continue aspirin statin   HTN: PTA medications were resumed which included amlodipine and Coreg however blood pressure slightly elevated so lisinopril was added.   COPD: Not in exacerbation continue bronchodilators   Anxiety/depression: PTA ON Klonopin 1MG  BID-resume at .5 mg twice daily as somnolence improved.  Holding Neurontin for now   Tobacco abuse: I have discussed tobacco cessation with the patient.  I have counseled the patient regarding the negative impacts of continued tobacco use including but not limited to lung cancer, COPD, and cardiovascular disease.  I have discussed alternatives to tobacco and modalities that may help facilitate tobacco cessation including but not limited to biofeedback, hypnosis, and medications.  Total time spent with tobacco counseling was 5 minutes.    Discharge plan was discussed with patient and/or family member and they verbalized understanding and agreed with it.  Discharge Diagnoses:  Principal Problem:   Generalized weakness Active Problems:   General weakness    Discharge Instructions   Allergies as of 11/26/2023   No Known Allergies      Medication List     TAKE these medications    amLODipine 10 MG tablet Commonly known as: NORVASC Take 1 tablet (10 mg total) by mouth daily.   aspirin EC 81 MG tablet Take 1 tablet (81 mg total) by mouth daily. Swallow whole.   atorvastatin 40 MG tablet Commonly known as: LIPITOR Take 40 mg by mouth every evening.   carvedilol 6.25 MG tablet Commonly known as: COREG Take 6.25 mg by mouth in the morning and at bedtime.   clonazePAM 0.5 MG tablet Commonly known as: KLONOPIN Take 1 tablet (0.5 mg total) by mouth 2 (two) times daily. What  changed:  medication strength how much to take   folic acid 1 MG tablet Commonly known as: FOLVITE Take 1 tablet (1 mg total) by mouth daily.   gabapentin 300 MG capsule Commonly known as: NEURONTIN Take 1 capsule (300 mg total) by mouth 3 (three) times daily. What changed: when to take this   Lacosamide 100 MG Tabs Take 1 tablet (100 mg total) by mouth 2 (two) times daily.   levETIRAcetam 1000 MG tablet Commonly known as: KEPPRA Take 2 tablets (2,000 mg total) by mouth 2 (two) times daily.   lisinopril 10 MG tablet Commonly known as: ZESTRIL Take 1 tablet (10 mg total) by mouth daily.   omeprazole 40 MG capsule Commonly known as: PRILOSEC Take 40 mg by mouth in the morning and at bedtime.   PHENobarbital 64.8 MG tablet Commonly known as: LUMINAL Take 1 tablet (64.8 mg total) by mouth 2 (two) times daily.        Contact information for after-discharge care     Destination     [facility] .   Service: Skilled Nursing Contact information: [address] [place] [address] [phone]                    No Known Allergies  Consultations: Neurology   Procedures/Studies: MR  BRAIN WO CONTRAST Result Date: 11/22/2023 CLINICAL DATA:  Neuro deficit, acute, stroke suspected EXAM: MRI HEAD WITHOUT CONTRAST TECHNIQUE: Multiplanar, multiecho pulse sequences of the brain and surrounding structures were obtained without intravenous contrast. COMPARISON:  MRI head June 09, 2023. FINDINGS: Brain: No acute infarction, acute hemorrhage, hydrocephalus, extra-axial collection or intraparenchymal mass lesion. Remote cerebellar infarcts bilaterally. Similar ex vacuo ventriculomegaly. Similar hemosiderin along the left cerebral convexity. 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 Vascular: Major arterial flow voids are maintained at the skull base. Skull and upper cervical spine: Normal marrow signal. Sinuses/Orbits: Mild paranasal sinus  mucosal thickening. No acute orbital findings. Other: No mastoid effusions. IMPRESSION: 1. No evidence of acute intracranial abnormality. 2. Remote cerebellar infarcts bilaterally. 3.  Cerebral Atrophy (ICD10-G31.9). Electronically Signed   By: Feliberto Harts M.D.   On: 11/22/2023 02:56   CT ANGIO HEAD NECK W WO CM (CODE STROKE) Result Date: 11/21/2023 CLINICAL DATA:  Neuro deficit, acute, stroke suspected EXAM: CT ANGIOGRAPHY HEAD AND NECK WITH AND WITHOUT CONTRAST TECHNIQUE: Multidetector CT imaging of the head and neck was performed using the standard protocol during bolus administration of intravenous contrast. Multiplanar CT image reconstructions and MIPs were obtained to evaluate the vascular anatomy. Carotid stenosis measurements (when applicable) are obtained utilizing NASCET criteria, using the distal internal carotid diameter as the denominator. RADIATION DOSE REDUCTION: This exam was performed according to the departmental dose-optimization program which includes automated exposure control, adjustment of the mA and/or kV according to patient size and/or use of iterative reconstruction technique. CONTRAST:  75mL OMNIPAQUE IOHEXOL 350 MG/ML SOLN COMPARISON:  CTA head/neck October 12 24. FINDINGS: CTA NECK FINDINGS Aortic arch: Great vessel origins are patent without significant stenosis. Aortic atherosclerosis. Right carotid system: No evidence of significant stenosis (50% or greater), or occlusion. 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 Left carotid system: Patent. 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 The ICA pseudoaneurysm is more thrombosed than on the prior. Vertebral arteries: Codominant. No evidence of dissection, stenosis (50% or greater), or occlusion. Chronically small. Skeleton: No acute abnormality on limited assessment. Other neck: No acute abnormality on limited  assessment. Upper chest: Emphysema. Review of the MIP images confirms the above findings CTA HEAD FINDINGS Anterior circulation: Bilateral intracranial ICAs, MCAs, and bilateral ACAs are patent without proximal hemodynamically significant stenosis. Posterior circulation: Bilateral intradural vertebral arteries, basilar artery and bilateral posterior cerebral arteries are patent without proximal hemodynamically significant stenosis. 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 Venous sinuses: As permitted by contrast timing, patent. Anatomic variants: Detailed above. Review of the MIP images confirms the above findings IMPRESSION: 1. No emergent large vessel occlusion or proximal hemodynamically significant stenosis intracranially. 2. 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 3. Aortic Atherosclerosis (ICD10-I70.0) and Emphysema (ICD10-J43.9). Electronically Signed   By: Feliberto Harts M.D.   On: 11/21/2023 22:45   CT HEAD CODE STROKE WO CONTRAST Result Date: 11/21/2023 CLINICAL DATA:  Code stroke.  Neuro deficit, acute, stroke suspected EXAM: CT HEAD WITHOUT CONTRAST TECHNIQUE: Contiguous axial images were obtained from the base of the skull through the vertex without intravenous contrast. RADIATION DOSE REDUCTION: This exam was performed according to the departmental dose-optimization program which includes automated exposure control, adjustment of the mA and/or kV according to patient size  and/or use of iterative reconstruction technique. COMPARISON:  CT head October 09, 2023. FINDINGS: Brain: Remote cerebellar infarcts. Patchy white matter hypodensities, compatible with chronic microvascular ischemic disease. No evidence of acute large vascular territory infarct, acute hemorrhage or mass lesion.  Cerebral atrophy. No hydrocephalus. Vascular: No hyperdense vessel. Skull: No acute fracture. Sinuses/Orbits: Clear sinuses.  No acute orbital findings. Other: No mastoid effusions. ASPECTS [facility] Stroke Program Early CT Score) Total score (0-10 with 10 being normal): 10. IMPRESSION: 1. No evidence of acute intracranial abnormality. 2. ASPECTS is 10. Code stroke imaging results were communicated on 11/21/2023 at 10:30 pm to provider Dr. Derry Lory Via secure text paging. Electronically Signed   By: Feliberto Harts M.D.   On: 11/21/2023 22:32     Discharge Exam: Vitals:   11/25/23 1245 11/25/23 2058  BP: 136/87 137/78  Pulse: 65 67  Resp: 18 18  Temp: 98.3 F (36.8 C) 97.8 F (36.6 C)  SpO2: 99% 100%   Vitals:   11/25/23 0906 11/25/23 0934 11/25/23 1245 11/25/23 2058  BP: (!) 154/89 (!) 164/94 136/87 137/78  Pulse: 62 60 65 67  Resp: 16 17 18 18   Temp: (!) 97.3 F (36.3 C) (!) 97.4 F (36.3 C) 98.3 F (36.8 C) 97.8 F (36.6 C)  TempSrc: Oral Oral  Oral  SpO2: 99% 99% 99% 100%  Weight:      Height:        General: Pt is alert, awake, not in acute distress Cardiovascular: RRR, S1/S2 +, no rubs, no gallops Respiratory: CTA bilaterally, no wheezing, no rhonchi Abdominal: Soft, NT, ND, bowel sounds + Extremities: no edema, no cyanosis Neuro: Chronic right upper extremity weakness due to previous stroke    The results of significant diagnostics from this hospitalization (including imaging, microbiology, ancillary and laboratory) are listed below for reference.     Microbiology: No results found for this or any previous visit (from the past 240 hours).   Labs: BNP (last 3 results) No results for input(s): "BNP" in the last 8760 hours. Basic Metabolic Panel: Recent Labs  Lab 11/21/23 2245 11/21/23 2313 11/23/23 0831  NA 139 140 138  K 4.1 4.3 4.2  CL 105 106 105  CO2 23  --  24  GLUCOSE 81 83 80  BUN 14 15 14   CREATININE 1.03 1.00 0.97  CALCIUM 9.2  --  8.8*    Liver Function Tests: Recent Labs  Lab 11/21/23 2245  AST 27  ALT 23  ALKPHOS 166*  BILITOT 0.5  PROT 7.4  ALBUMIN 3.7   No results for input(s): "LIPASE", "AMYLASE" in the last 168 hours. Recent Labs  Lab 11/22/23 0631  AMMONIA 33   CBC: Recent Labs  Lab 11/21/23 2245 11/21/23 2313 11/23/23 0831  WBC 6.3  --  5.4  NEUTROABS 2.7  --   --   HGB 14.8 17.0 14.8  HCT 47.9 50.0 45.3  MCV 89.2  --  86.6  PLT 216  --  200   Cardiac Enzymes: No results for input(s): "CKTOTAL", "CKMB", "CKMBINDEX", "TROPONINI" in the last 168 hours. BNP: Invalid input(s): "POCBNP" CBG: Recent Labs  Lab 11/21/23 2156  GLUCAP 78   D-Dimer No results for input(s): "DDIMER" in the last 72 hours. Hgb A1c No results for input(s): "HGBA1C" in the last 72 hours. Lipid Profile No results for input(s): "CHOL", "HDL", "LDLCALC", "TRIG", "CHOLHDL", "LDLDIRECT" in the last 72 hours. Thyroid function studies No results for input(s): "TSH", "T4TOTAL", "T3FREE", "THYROIDAB" in the last 72 hours.  Invalid input(s): "FREET3" Anemia work up No results for input(s): "VITAMINB12", "FOLATE", "FERRITIN", "TIBC", "IRON", "RETICCTPCT" in the last 72 hours. Urinalysis    Component Value Date/Time   COLORURINE YELLOW 11/22/2023 0315   APPEARANCEUR CLEAR 11/22/2023 0315   LABSPEC 1.038 (H) 11/22/2023 0315   PHURINE 5.0 11/22/2023 0315   GLUCOSEU NEGATIVE 11/22/2023 0315   HGBUR NEGATIVE 11/22/2023 0315   BILIRUBINUR NEGATIVE 11/22/2023 0315   KETONESUR NEGATIVE 11/22/2023 0315   PROTEINUR NEGATIVE 11/22/2023 0315   NITRITE NEGATIVE 11/22/2023 0315   LEUKOCYTESUR NEGATIVE 11/22/2023 0315   Sepsis Labs Recent Labs  Lab 11/21/23 2245 11/23/23 0831  WBC 6.3 5.4   Microbiology No results found for this or any previous visit (from the past 240 hours).  FURTHER DISCHARGE INSTRUCTIONS:   Get Medicines reviewed and adjusted: Please take all your medications with you for your next visit with your  Primary MD   Laboratory/radiological data: Please request your Primary MD to go over all hospital tests and procedure/radiological results at the follow up, please ask your Primary MD to get all Hospital records sent to his/her office.   In some cases, they will be blood work, cultures and biopsy results pending at the time of your discharge. Please request that your primary care M.D. goes through all the records of your hospital data and follows up on these results.   Also Note the following: If you experience worsening of your admission symptoms, develop shortness of breath, life threatening emergency, suicidal or homicidal thoughts you must seek medical attention immediately by calling 911 or calling your MD immediately  if symptoms less severe.   You must read complete instructions/literature along with all the possible adverse reactions/side effects for all the Medicines you take and that have been prescribed to you. Take any new Medicines after you have completely understood and accpet all the possible adverse reactions/side effects.    Do not drive when taking Pain medications or sleeping medications (Benzodaizepines)   Do not take more than prescribed Pain, Sleep and Anxiety Medications. It is not advisable to combine anxiety,sleep and pain medications without talking with your primary care practitioner   Special Instructions: If you have smoked or chewed Tobacco  in the last 2 yrs please stop smoking, stop any regular Alcohol  and or any Recreational drug use.   Wear Seat belts while driving.   Please note: You were cared for by a hospitalist during your hospital stay. Once you are discharged, your primary care physician will handle any further medical issues. Please note that NO REFILLS for any discharge medications will be authorized once you are discharged, as it is imperative that you return to your primary care physician (or establish a relationship with a primary care physician if  you do not have one) for your post hospital discharge needs so that they can reassess your need for medications and monitor your lab values  Time coordinating discharge: Over 30 minutes  SIGNED:   Hughie Closs, MD  [facility] 11/26/2023, 8:13 AM *Please note that this is a verbal dictation therefore any spelling or grammatical errors are due to the "Dragon Medical One" system interpretation. If 7PM-7AM, please contact night-coverage [email or web address]

## 2023-11-26 NOTE — Care Management Important Message (Signed)
 Important Message  Patient Details  Name: Seth [name] MRN: [id number] Date of Birth: 1956/06/18   Important Message Given:  Yes - Medicare IM     Dorena Bodo 11/26/2023, 3:40 PM

## 2023-11-26 NOTE — Plan of Care (Signed)

## 2023-11-26 NOTE — TOC Transition Note (Signed)
 Transition of Care [facility]) - Discharge Note   Patient Details  Name: Seth [name] MRN: [id number] Date of Birth: [birth year]  [facility]) CM/SW Contact:  Regginald Pask A [place], LCSW Phone Number: 11/26/2023, 11:06 AM   Clinical Narrative:     Patient will DC to: [facility]  Anticipated DC date: 11/26/23  Family notified: Conley Rolls  Transport by: Sharin Mons  Authorization ID:  [id number]  Approval Dates:  3/29-11/27/23     Per MD patient ready for DC to [facility]. RN, patient, patient's family, and facility notified of DC. Discharge Summary and FL2 sent to facility. RN to call report prior to discharge (Room 118, [phone]). DC packet on chart. Ambulance transport requested for patient.     CSW will sign off for now as social work intervention is no longer needed. Please consult [place] again if new needs arise.   Final next level of care: Skilled Nursing Facility Barriers to Discharge: Barriers Resolved   Patient Goals and CMS Choice            Discharge Placement              Patient chooses bed at: [facility] Patient to be transferred to facility by: [facility] Name of family member notified: Conley Rolls Patient and family notified of of transfer: 11/26/23  Discharge Plan and Services Additional resources added to the After Visit Summary for                                       Social Drivers of Health (SDOH) Interventions SDOH Screenings   Food Insecurity: No Food Insecurity (11/22/2023)  Housing: Low Risk  (11/22/2023)  Transportation Needs: No Transportation Needs (11/22/2023)  Utilities: Not At Risk (11/22/2023)  Depression (PHQ2-9): Low Risk  (08/23/2020)  Financial Resource Strain: Medium Risk (12/07/2017)  Social Connections: Unknown (11/22/2023)  Stress: Stress Concern Present (12/07/2017)  Tobacco Use: High Risk (11/21/2023)     Readmission Risk Interventions    12/22/2022    4:22 PM  Readmission Risk Prevention Plan   Transportation Screening Complete  HRI or Home Care Consult Complete  Social Work Consult for Recovery Care Planning/Counseling Complete  Palliative Care Screening Not Applicable  Medication Review Oceanographer) Complete

## 2023-12-03 ENCOUNTER — Ambulatory Visit: Admitting: Orthopedic Surgery

## 2023-12-05 ENCOUNTER — Ambulatory Visit: Admitting: Family

## 2023-12-11 ENCOUNTER — Telehealth: Payer: Self-pay

## 2023-12-11 ENCOUNTER — Other Ambulatory Visit: Payer: Self-pay | Admitting: Neurology

## 2023-12-11 NOTE — Telephone Encounter (Signed)
 Call to patient to discuss vimpat refill request from pharmacy. No answer. Left message to return call

## 2023-12-11 NOTE — Telephone Encounter (Signed)
 Requested Prescriptions   Pending Prescriptions Disp Refills   Lacosamide 100 MG TABS [Pharmacy Med Name: lacosamide 100 mg tablet] 180 tablet PRN    Sig: take 1 Tablet by mouth twice daily (morning, evening)   clonazePAM (KLONOPIN) 1 MG tablet [Pharmacy Med Name: clonazepam 1 mg tablet] 60 tablet PRN    Sig: TAKE ONE TABLET BY MOUTH TWICE DAILY (morning, evening)   Last seen 06/13/23, next appt 12/31/23 Dispenses   Dispensed Days Supply Quantity Provider Pharmacy  lacosamide 100 mg tablet 11/14/2023 30 60 each Cassandra Cleveland, MD [facility]  lacosamide 100 mg tablet 10/17/2023 30 60 each Cassandra Cleveland, MD [facility]  lacosamide 100 mg tablet 09/19/2023 30 60 each Cassandra Cleveland, MD [facility]  lacosamide 100 mg tablet 08/13/2023 30 60 each Cassandra Cleveland, MD [facility]  Lacosamide 100 MG TABS 06/12/2023 30 60 tablet Singh, Prashant K, MD [facility]...  LACOSAMIDE 100MG     TAB 01/05/2023 30 60 each Samtani, Jai-Gurmukh, MD [facility] [phone] ...     Dispenses   Dispensed Days Supply Quantity Provider Pharmacy  clonazepam 1 mg tablet 11/14/2023 30 60 each Cassandra Cleveland, MD [facility]  clonazepam 1 mg tablet 10/17/2023 30 60 each Cassandra Cleveland, MD [facility]  clonazepam 1 mg tablet 09/19/2023 30 60 each Maryellen Snare, NP [facility]  clonazepam 1 mg tablet 08/14/2023 30 60 each Maryellen Snare, NP [facility]  clonazepam 1 mg tablet 07/17/2023 30 60 each Maryellen Snare, NP [facility]  clonazePAM (KLONOPIN) 1 MG tablet 06/12/2023 30 60 tablet Singh, Prashant K, MD [facility]...  clonazepam 1 mg tablet 05/23/2023 30 60 each Maryellen Snare, NP [facility]  clonazepam 1 mg tablet 03/22/2023 30 60 each Maryellen Snare, NP [facility]  clonazepam 1 mg tablet 02/22/2023 30 60 each Maryellen Snare, NP [facility]  clonazepam 1 mg tablet 01/24/2023 30 60 each Maryellen Snare, NP [facility]  CLONAZEPAM 1MG       TAB 12/24/2022 30 60 each Samtani, Jai-Gurmukh, MD [facility] [phone] .Aaron AasAaron Aas

## 2023-12-12 ENCOUNTER — Ambulatory Visit: Admitting: Family

## 2023-12-13 ENCOUNTER — Other Ambulatory Visit: Payer: Self-pay | Admitting: Neurology

## 2023-12-13 NOTE — Telephone Encounter (Signed)
 No. Medication discontinued at last hospital admission

## 2023-12-18 ENCOUNTER — Other Ambulatory Visit: Payer: Self-pay | Admitting: Neurology

## 2023-12-27 ENCOUNTER — Emergency Department (HOSPITAL_COMMUNITY)

## 2023-12-27 ENCOUNTER — Other Ambulatory Visit: Payer: Self-pay

## 2023-12-27 ENCOUNTER — Encounter (HOSPITAL_COMMUNITY): Payer: Self-pay

## 2023-12-27 ENCOUNTER — Observation Stay (HOSPITAL_COMMUNITY)
Admission: EM | Admit: 2023-12-27 | Discharge: 2024-01-01 | Disposition: A | Discharge status: Skilled Nursing Facility | Attending: Internal Medicine | Admitting: Internal Medicine

## 2023-12-27 DIAGNOSIS — W19XXXA Unspecified fall, initial encounter: Secondary | ICD-10-CM | POA: Diagnosis not present

## 2023-12-27 DIAGNOSIS — R569 Unspecified convulsions: Secondary | ICD-10-CM

## 2023-12-27 DIAGNOSIS — I639 Cerebral infarction, unspecified: Secondary | ICD-10-CM

## 2023-12-27 DIAGNOSIS — I10 Essential (primary) hypertension: Secondary | ICD-10-CM | POA: Insufficient documentation

## 2023-12-27 DIAGNOSIS — Z8673 Personal history of transient ischemic attack (TIA), and cerebral infarction without residual deficits: Secondary | ICD-10-CM | POA: Insufficient documentation

## 2023-12-27 DIAGNOSIS — R4182 Altered mental status, unspecified: Secondary | ICD-10-CM | POA: Diagnosis present

## 2023-12-27 DIAGNOSIS — Z7982 Long term (current) use of aspirin: Secondary | ICD-10-CM | POA: Diagnosis not present

## 2023-12-27 DIAGNOSIS — Z79899 Other long term (current) drug therapy: Secondary | ICD-10-CM | POA: Insufficient documentation

## 2023-12-27 DIAGNOSIS — F17210 Nicotine dependence, cigarettes, uncomplicated: Secondary | ICD-10-CM | POA: Diagnosis not present

## 2023-12-27 DIAGNOSIS — I69351 Hemiplegia and hemiparesis following cerebral infarction affecting right dominant side: Secondary | ICD-10-CM

## 2023-12-27 DIAGNOSIS — J449 Chronic obstructive pulmonary disease, unspecified: Secondary | ICD-10-CM | POA: Diagnosis not present

## 2023-12-27 DIAGNOSIS — S42351A Displaced comminuted fracture of shaft of humerus, right arm, initial encounter for closed fracture: Secondary | ICD-10-CM | POA: Diagnosis not present

## 2023-12-27 DIAGNOSIS — R404 Transient alteration of awareness: Secondary | ICD-10-CM

## 2023-12-27 LAB — DIFFERENTIAL
Abs Immature Granulocytes: 0 10*3/uL (ref 0.00–0.07)
Basophils Absolute: 0.1 10*3/uL (ref 0.0–0.1)
Basophils Relative: 1 %
Eosinophils Absolute: 0.3 10*3/uL (ref 0.0–0.5)
Eosinophils Relative: 3 %
Lymphocytes Relative: 9 %
Lymphs Abs: 1 10*3/uL (ref 0.7–4.0)
Monocytes Absolute: 0.4 10*3/uL (ref 0.1–1.0)
Monocytes Relative: 4 %
Neutro Abs: 9 10*3/uL — ABNORMAL HIGH (ref 1.7–7.7)
Neutrophils Relative %: 83 %
nRBC: 0 /100{WBCs}

## 2023-12-27 LAB — CBC
HCT: 45.9 % (ref 39.0–52.0)
Hemoglobin: 14.3 g/dL (ref 13.0–17.0)
MCH: 27.9 pg (ref 26.0–34.0)
MCHC: 31.2 g/dL (ref 30.0–36.0)
MCV: 89.6 fL (ref 80.0–100.0)
Platelets: 224 10*3/uL (ref 150–400)
RBC: 5.12 MIL/uL (ref 4.22–5.81)
RDW: 16.2 % — ABNORMAL HIGH (ref 11.5–15.5)
WBC: 10.9 10*3/uL — ABNORMAL HIGH (ref 4.0–10.5)
nRBC: 0 % (ref 0.0–0.2)

## 2023-12-27 LAB — I-STAT CHEM 8, ED
BUN: 12 mg/dL (ref 8–23)
Calcium, Ion: 0.95 mmol/L — ABNORMAL LOW (ref 1.15–1.40)
Chloride: 108 mmol/L (ref 98–111)
Creatinine, Ser: 0.9 mg/dL (ref 0.61–1.24)
Glucose, Bld: 76 mg/dL (ref 70–99)
HCT: 47 % (ref 39.0–52.0)
Hemoglobin: 16 g/dL (ref 13.0–17.0)
Potassium: 4.2 mmol/L (ref 3.5–5.1)
Sodium: 137 mmol/L (ref 135–145)
TCO2: 22 mmol/L (ref 22–32)

## 2023-12-27 LAB — COMPREHENSIVE METABOLIC PANEL WITH GFR
ALT: 13 U/L (ref 0–44)
AST: 19 U/L (ref 15–41)
Albumin: 3.4 g/dL — ABNORMAL LOW (ref 3.5–5.0)
Alkaline Phosphatase: 144 U/L — ABNORMAL HIGH (ref 38–126)
Anion gap: 8 (ref 5–15)
BUN: 9 mg/dL (ref 8–23)
CO2: 23 mmol/L (ref 22–32)
Calcium: 9.1 mg/dL (ref 8.9–10.3)
Chloride: 107 mmol/L (ref 98–111)
Creatinine, Ser: 1.03 mg/dL (ref 0.61–1.24)
GFR, Estimated: >60 mL/min (ref >60)
Glucose, Bld: 85 mg/dL (ref 70–99)
Potassium: 3.9 mmol/L (ref 3.5–5.1)
Sodium: 138 mmol/L (ref 135–145)
Total Bilirubin: 0.7 mg/dL (ref 0.0–1.2)
Total Protein: 6.9 g/dL (ref 6.5–8.1)

## 2023-12-27 LAB — CBG MONITORING, ED: Glucose-Capillary: 77 mg/dL (ref 70–99)

## 2023-12-27 LAB — APTT: aPTT: 34 s (ref 24–36)

## 2023-12-27 LAB — PROTIME-INR
INR: 1.1 (ref 0.8–1.2)
Prothrombin Time: 14.1 s (ref 11.4–15.2)

## 2023-12-27 MED ORDER — LEVETIRACETAM 500 MG PO TABS
2000.0000 mg | ORAL_TABLET | Freq: Two times a day (BID) | ORAL | Status: DC
  Administered 2023-12-28 – 2024-01-01 (×9): 2000 mg via ORAL
  Filled 2023-12-27 (×9): qty 4

## 2023-12-27 MED ORDER — ENOXAPARIN SODIUM 40 MG/0.4ML IJ SOSY
40.0000 mg | PREFILLED_SYRINGE | INTRAMUSCULAR | Status: DC
  Administered 2023-12-28 – 2024-01-01 (×5): 40 mg via SUBCUTANEOUS
  Filled 2023-12-27 (×5): qty 0.4

## 2023-12-27 MED ORDER — PHENOBARBITAL 32.4 MG PO TABS
64.8000 mg | ORAL_TABLET | Freq: Two times a day (BID) | ORAL | Status: DC
  Administered 2023-12-28 (×2): 64.8 mg via ORAL
  Filled 2023-12-27: qty 2
  Filled 2023-12-27: qty 1

## 2023-12-27 MED ORDER — LACOSAMIDE 50 MG PO TABS
100.0000 mg | ORAL_TABLET | Freq: Two times a day (BID) | ORAL | Status: DC
  Administered 2023-12-28 – 2024-01-01 (×9): 100 mg via ORAL
  Filled 2023-12-27 (×9): qty 2

## 2023-12-27 MED ORDER — SODIUM CHLORIDE 0.9% FLUSH
3.0000 mL | Freq: Once | INTRAVENOUS | Status: AC
  Administered 2023-12-27: 3 mL via INTRAVENOUS

## 2023-12-27 MED ORDER — ASPIRIN 81 MG PO TBEC
81.0000 mg | DELAYED_RELEASE_TABLET | Freq: Every day | ORAL | Status: DC
Start: 2023-12-27 — End: 2024-01-02
  Administered 2023-12-27 – 2024-01-01 (×6): 81 mg via ORAL
  Filled 2023-12-27 (×6): qty 1

## 2023-12-27 NOTE — Code Documentation (Signed)
 Stroke Response Nurse Documentation Code Documentation  Seth [name] is a 67 y.o. male arriving to [facility]  via [facility] on 12/27/2023 with past medical hx of prior CVA and seizures. On aspirin  81 mg daily. Code stroke was activated by EMS.   Patient from home where he was LKW at 1100 and now complaining of dysarthria . Pt's sister heard a thud, ran to him, and found him down. EMS activated code stroke due to dysarthria.   Stroke team at the bedside on patient arrival. Labs drawn and patient cleared for CT by Dr. Scarlette Currier. Placed in C collar. Patient to CT with team. NIHSS 9, see documentation for details and code stroke times. Patient with decreased LOC, disoriented, right arm weakness, right leg weakness, and dysarthria  on exam. The following imaging was completed:  CT Head. Patient is not a candidate for IV Thrombolytic due to stroke not suspected. Patient is not a candidate for IR due to no LVO sign..   Care Plan: q 2 hr x 12, then q 4.    Bedside handoff with ED RN Ellender Gust, Jule Nyhan  Stroke Response RN

## 2023-12-27 NOTE — ED Provider Notes (Signed)
 Hx of prior stroke and baseline arm weakness but fall today and more weakness to the right arm.  Initially code stroke but did not get tnkase.  Pt is still altered and now new humeral fx. Ortho is involved for the fx.  Neuro will follow.  Initial labs and head CT without acute findings.  Will admit to medicine   Almond Army, MD 12/27/23 [phone]
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
## 2023-12-27 NOTE — ED Notes (Signed)
 Pt currently in [facility]

## 2023-12-27 NOTE — Hospital Course (Signed)
 This is a 68 year old gentleman who presented to the emergency department with altered mental status and a fall and was subsequently found to have a right proximal humeral fracture.  He subsequently underwent ORIF with orthopedic surgery. His hospital stay was complicated by post op fever, which was not felt to be infectious in nature and resolved on post-op day two.   #Altered mental status, resolved #History of seizures Likely secondary to polypharmacy in the setting of seizure disorder.  This was discussed with neurology, phenobarbital  decreased to 32.4 mg twice daily. His mental status has improved. - Patient benefit from outpatient neurology follow-up. 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  Please follow-up as clinically indicated outpatient.   #Fracture of proximal right humerus Status post right humeral ORIF May 3.  Doing well postop. Nonweightbearing right upper extremity Sling is optional Okay to use right upper extremity for activities of daily living that are nonweightbearing Keep dressings clean and dry, okay to change with regular dry gauze if needed   #Bilateral cerebral infarcts #HLD  Prior history of right-sided weakness.  CT of the head, and MRI of the brain negative for new stroke. - Continue aspirin  81 mg daily - Initiate Crestor  20 mg daily so as to avoid drug-drug interaction between phenobarb and Lipitor

## 2023-12-27 NOTE — ED Triage Notes (Signed)
 Patient bib EMS from home after he was found by sister on the floor. He was found laying on his right arm at an awkward angle. Sister reports he has slurred speech, and right side deficits. He does have a history of seizures. EMS reports right arm pain and neck pain.   Right arm has humerus deformity.

## 2023-12-27 NOTE — ED Provider Notes (Signed)
 [facility] EMERGENCY DEPARTMENT AT [facility] Provider Note   CSN: [id number] Arrival date & time: 12/27/23  1400  An emergency department physician performed an initial assessment on this suspected stroke patient at 1403.  History  Chief Complaint  Patient presents with   Code Stroke    Seth [name] is a 68 y.o. male.  Pt is a 68 yo with pmhx significant for depression, htn, cva, seizures, and copd.  Pt lives with his sister and she found him on the ground.  His right arm was bent at a strange angle.  He has a hx of prior cva with slurred speech and right arm weakness.  He was just admitted at the end of March for similar complaints.  Pt is a very poor historian.        Home Medications Prior to Admission medications   Medication Sig Start Date End Date Taking? Authorizing Provider  amLODipine  (NORVASC ) 10 MG tablet Take 1 tablet (10 mg total) by mouth daily. 06/12/23   Singh, Prashant K, MD  aspirin  EC 81 MG tablet Take 1 tablet (81 mg total) by mouth daily. Swallow whole. 06/12/23   Singh, Prashant K, MD  atorvastatin  (LIPITOR) 40 MG tablet Take 40 mg by mouth every evening. 05/22/23   [provider]  carvedilol  (COREG ) 6.25 MG tablet Take 6.25 mg by mouth in the morning and at bedtime. 09/19/23   [provider]  folic acid  (FOLVITE ) 1 MG tablet Take 1 tablet (1 mg total) by mouth daily. 06/12/23   Singh, Prashant K, MD  gabapentin  (NEURONTIN ) 300 MG capsule Take 1 capsule (300 mg total) by mouth 3 (three) times daily. Patient taking differently: Take 300 mg by mouth 2 (two) times daily. 06/13/23 06/07/24  Camara, Amadou, MD  Lacosamide  100 MG TABS Take 1 tablet (100 mg total) by mouth in the morning and at bedtime. 12/12/23 12/06/24  Camara, Amadou, MD  levETIRAcetam  (KEPPRA ) 1000 MG tablet Take 2 tablets (2,000 mg total) by mouth 2 (two) times daily. 06/13/23 06/07/24  Camara, Amadou, MD  lisinopril  (ZESTRIL ) 10 MG tablet Take 1 tablet (10 mg  total) by mouth daily. 11/26/23   Pahwani, Ravi, MD  omeprazole (PRILOSEC) 40 MG capsule Take 40 mg by mouth in the morning and at bedtime. 10/09/22   [provider]  PHENobarbital  (LUMINAL) 64.8 MG tablet Take 1 tablet (64.8 mg total) by mouth 2 (two) times daily. 06/13/23 06/07/24  Camara, Amadou, MD      Allergies    Patient has no known allergies.    Review of Systems   Review of Systems  Musculoskeletal:        Right arm pain  Neurological:  Positive for weakness.  All other systems reviewed and are negative.   Physical Exam Updated Vital Signs BP 130/81   Pulse 64   Temp (!) 97.3 F (36.3 C) (Oral)   Resp 18   Ht 6\' 3"  (1.905 m)   Wt 75.6 kg   SpO2 96%   BMI 20.83 kg/m  Physical Exam Vitals and nursing note reviewed.  Constitutional:      Appearance: Normal appearance.  HENT:     Head: Normocephalic and atraumatic.     Right Ear: External ear normal.     Left Ear: External ear normal.     Nose: Nose normal.     Mouth/Throat:     Mouth: Mucous membranes are moist.     Pharynx: Oropharynx is clear.  Eyes:  Extraocular Movements: Extraocular movements intact.     Conjunctiva/sclera: Conjunctivae normal.     Pupils: Pupils are equal, round, and reactive to light.  Cardiovascular:     Rate and Rhythm: Normal rate and regular rhythm.     Pulses: Normal pulses.     Heart sounds: Normal heart sounds.  Pulmonary:     Effort: Pulmonary effort is normal.     Breath sounds: Normal breath sounds.  Abdominal:     General: Abdomen is flat. Bowel sounds are normal.     Palpations: Abdomen is soft.  Musculoskeletal:     Cervical back: Normal range of motion and neck supple.     Comments: Deformity right upper arm  Skin:    General: Skin is warm.     Capillary Refill: Capillary refill takes less than 2 seconds.  Neurological:     Mental Status: He is easily aroused. He is lethargic.     Comments: Pt will wake up, but goes right back to sleep     ED  Results / Procedures / Treatments   Labs (all labs ordered are listed, but only abnormal results are displayed) Labs Reviewed  CBC - Abnormal; Notable for the following components:      Result Value   WBC 10.9 (*)    RDW 16.2 (*)    All other components within normal limits  DIFFERENTIAL - Abnormal; Notable for the following components:   Neutro Abs 9.0 (*)    All other components within normal limits  I-STAT CHEM 8, ED - Abnormal; Notable for the following components:   Calcium , Ion 0.95 (*)    All other components within normal limits  PROTIME-INR  APTT  COMPREHENSIVE METABOLIC PANEL WITH GFR  RAPID URINE DRUG SCREEN, HOSP PERFORMED  CK  CBG MONITORING, ED    EKG EKG Interpretation Date/Time:  Thursday Dec 27 2023 14:25:44 EDT Ventricular Rate:  66 PR Interval:  71 QRS Duration:  85 QT Interval:  395 QTC Calculation: 414 R Axis:   66  Text Interpretation: Sinus rhythm Short PR interval Anteroseptal infarct, old No significant change since last tracing Confirmed by Sueellen Emery [phone]) on 12/27/2023 2:38:10 PM  Radiology DG Pelvis Portable Result Date: 12/27/2023 CLINICAL DATA:  Marvell Slider, code stroke, right arm pain EXAM: PORTABLE PELVIS 1-2 VIEWS COMPARISON:  02/13/2023 FINDINGS: Single frontal view of the pelvis includes both hips. No acute displaced fracture, subluxation, or dislocation. Mild symmetrical bilateral hip osteoarthritis unchanged. The sacroiliac joints are normal. IMPRESSION: 1. No acute displaced fracture.  Stable osteoarthritis. Electronically Signed   By: Bobbye Burrow M.D.   On: 12/27/2023 15:37   DG Chest Portable 1 View Result Date: 12/27/2023 CLINICAL DATA:  Marvell Slider, right upper arm pain EXAM: PORTABLE CHEST 1 VIEW COMPARISON:  02/13/2023 FINDINGS: Single frontal view of the chest excludes the left costophrenic angle by collimation. No acute airspace disease, effusion, or pneumothorax. The cardiac silhouette is unremarkable. Comminuted proximal right humeral  fracture extending from the humeral neck through the proximal humeral diaphysis. IMPRESSION: 1. Comminuted proximal right humeral fracture. Please see dedicated right humeral study. 2. No acute intrathoracic process. Electronically Signed   By: Bobbye Burrow M.D.   On: 12/27/2023 15:37   DG Humerus Right Result Date: 12/27/2023 CLINICAL DATA:  Marvell Slider, right arm pain EXAM: RIGHT HUMERUS - 2+ VIEW COMPARISON:  None Available. FINDINGS: Frontal and lateral views of the right humerus are obtained on 3 images. There is a long segment comminuted displaced fracture of the proximal  humerus. The fracture line extends through the humeral neck and greater tuberosity superiorly, with inferior extent through the proximal humeral diaphysis. Ventral angulation at the fracture site. Anatomic alignment of the shoulder and elbow. Soft tissue swelling throughout the upper arm. Visualized portions of the right chest are clear. IMPRESSION: 1. Long segment comminuted displaced fracture of the proximal humerus, extending from the humeral neck through the proximal humeral diaphysis. Ventral angulation at the fracture site. Electronically Signed   By: Bobbye Burrow M.D.   On: 12/27/2023 15:36   CT Cervical Spine Wo Contrast Result Date: 12/27/2023 CLINICAL DATA:  Neck trauma EXAM: CT CERVICAL SPINE WITHOUT CONTRAST TECHNIQUE: Multidetector CT imaging of the cervical spine was performed without intravenous contrast. Multiplanar CT image reconstructions were also generated. RADIATION DOSE REDUCTION: This exam was performed according to the departmental dose-optimization program which includes automated exposure control, adjustment of the mA and/or kV according to patient size and/or use of iterative reconstruction technique. COMPARISON:  11/21/2023, 10/09/2023 FINDINGS: Alignment: Alignment is grossly anatomic. Skull base and vertebrae: No acute fracture. No primary bone lesion or focal pathologic process. Soft tissues and spinal canal:  No prevertebral fluid or swelling. No visible canal hematoma. Disc levels: Prior C3-4 ACDF. Multilevel facet hypertrophy greatest at C2-3. Mild C4-5 and C5-6 spondylosis. Upper chest: Airway is patent. Emphysematous changes at the lung apices. Other: Reconstructed images demonstrate no additional findings. IMPRESSION: 1. No acute cervical spine fracture. 2. Stable C3-4 ACDF. 3. Stable mild multilevel spondylosis and facet hypertrophy as above. Electronically Signed   By: Bobbye Burrow M.D.   On: 12/27/2023 15:34   CT HEAD CODE STROKE WO CONTRAST Result Date: 12/27/2023 CLINICAL DATA:  Code stroke. Neuro deficit, concern for stroke, slurred speech. EXAM: CT HEAD WITHOUT CONTRAST TECHNIQUE: Contiguous axial images were obtained from the base of the skull through the vertex without intravenous contrast. RADIATION DOSE REDUCTION: This exam was performed according to the departmental dose-optimization program which includes automated exposure control, adjustment of the mA and/or kV according to patient size and/or use of iterative reconstruction technique. COMPARISON:  MRI head 11/22/2023. FINDINGS: Brain: No acute intracranial hemorrhage. No CT evidence of acute infarct. Nonspecific hypoattenuation in the periventricular and subcortical white matter favored to reflect chronic microvascular ischemic changes. Remote lacunar infarcts in the right thalamus. Additional remote infarcts in the bilateral cerebellum. 1.2 cm slightly hyperattenuating parafalcine mass abutting the parasagittal right frontal lobe likely reflecting a meningioma. No edema, mass effect, or midline shift. The basilar cisterns are patent. Ventricles: Similar moderate ventriculomegaly likely related to parenchymal volume loss. Similar asymmetry of the left lateral ventricle. Vascular: Atherosclerotic calcifications of the carotid siphons. No hyperdense vessel. Skull: No acute or aggressive finding. Orbits: Orbits are symmetric. Sinuses: Mild mucosal  thickening in the ethmoid and maxillary sinuses as well as the right frontal sinus. Other: Mastoid air cells are clear. 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 ASPECTS [facility] Stroke Program Early CT Score) - Ganglionic level infarction (caudate, lentiform nuclei, internal capsule, insula, M1-M3 cortex): 7 - Supraganglionic infarction (M4-M6 cortex): 3 Total score (0-10 with 10 being normal): 10 IMPRESSION: 1. No CT evidence of acute intracranial abnormality. 2. Moderate chronic microvascular ischemic changes and parenchymal volume loss. 3. Remote infarcts in the right thalamus and bilateral cerebellum. 4. 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 5. 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 6. ASPECTS  is 10 Finding of no acute abnormality and aspects score results were communicated to Dr. Lindzen At 2:46 pm on 12/27/2023 by text page via the [facility] messaging system. Electronically Signed   By: Denny Flack M.D.   On: 12/27/2023 14:51    Procedures Procedures    Medications Ordered in ED Medications  sodium chloride  flush (NS) 0.9 % injection 3 mL (3 mLs Intravenous Given 12/27/23 1548)    ED Course/ Medical Decision Making/ A&P                                 Medical Decision Making Amount and/or Complexity of Data Reviewed Labs: ordered. Radiology: ordered.   This patient presents to the ED for concern of cva, this involves an extensive number of treatment options, and is a complaint that carries with it a high risk of complications and morbidity.  The differential diagnosis includes cva, seizure, polypharmacy, infection   Co morbidities that complicate the patient evaluation  depression, htn, cva, seizures, and copd   Additional history obtained:  Additional history obtained from epic chart review External records  from outside source obtained and reviewed including EMS report   Lab Tests:  I Ordered, and personally interpreted labs.  The pertinent results include:  cbc nl,    Imaging Studies ordered:  I ordered imaging studies including pelvis, cxr, r humerus, ct c-spine, ct head  I independently visualized and interpreted imaging which showed  CT head:  No CT evidence of acute intracranial abnormality.  2. Moderate chronic microvascular ischemic changes and parenchymal  volume loss.  3. Remote infarcts in the right thalamus and bilateral cerebellum.  4. 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  5. 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  6. ASPECTS is 10  CT cervical spine: 1. No acute cervical spine fracture.  2. Stable C3-4 ACDF.  3. Stable mild multilevel spondylosis and facet hypertrophy as  above.  Pelvis: :  1. No acute displaced fracture.  Stable osteoarthritis.  CXR: Comminuted proximal right humeral fracture. Please see dedicated  right humeral study.  2. No acute intrathoracic process.  R humerus:  Long segment comminuted displaced fracture of the proximal  humerus, extending from the humeral neck through the proximal  humeral diaphysis. Ventral angulation at the fracture site.   I agree with the radiologist interpretation   Cardiac Monitoring:  The patient was maintained on a cardiac monitor.  I personally viewed and interpreted the cardiac monitored which showed an underlying rhythm of: nsr   Medicines ordered and prescription drug management:   I have reviewed the patients home medicines and have made adjustments as needed   Test Considered:  Ct/mri   Critical Interventions:  Code stroke   Consultations Obtained:  I requested consultation with the neurologist (Dr. Renaee Caro),  and discussed lab and imaging findings as well as pertinent plan - no tnk.     Problem List  / ED Course:  Right humerus fx:  complicated.  Ortho consult.   Pt placed in a sling. Pt will need ORIF when cleared AMS:  polypharmacy vs seizure vs cva.  Work up pending.  Pt signed out at shift change.    Reevaluation:  After the interventions noted above, I reevaluated the patient and found that they have :improved   Social Determinants of Health:  Lives with sister  Dispostion:  After consideration of the diagnostic results and the patients response to treatment, I feel that the patent would benefit from admission.    CRITICAL CARE Performed by: Sueellen Emery   Total critical care time: 30 minutes  Critical care time was exclusive of separately billable procedures and treating other patients.  Critical care was necessary to treat or prevent imminent or life-threatening deterioration.  Critical care was time spent personally by me on the following activities: development of treatment plan with patient and/or surrogate as well as nursing, discussions with consultants, evaluation of patient's response to treatment, examination of patient, obtaining history from patient or surrogate, ordering and performing treatments and interventions, ordering and review of laboratory studies, ordering and review of radiographic studies, pulse oximetry and re-evaluation of patient's condition.         Final Clinical Impression(s) / ED Diagnoses Final diagnoses:  Closed displaced comminuted fracture of shaft of right humerus, initial encounter  Altered mental status, unspecified altered mental status type    Rx / DC Orders ED Discharge Orders     None         Sueellen Emery, MD 12/27/23 1611

## 2023-12-27 NOTE — ED Notes (Signed)
 Patient to MRI.
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
## 2023-12-27 NOTE — Procedures (Signed)
 Patient Name: Seth [name]  MRN: [id number]  Epilepsy Attending: Arleene Lack  Referring Physician/Provider: Audrene Lease, NP  Date: 12/27/2023 Duration: 23.29 mins  Patient history: 68 yo with pmhx significant for depression, htn, cva, seizures, and copd. Pt lives with his sister and she found him on the ground. His right arm was bent at a strange angle. He has a hx of prior cva with slurred speech and right arm weakness. EEG to evaluate for seizure  Level of alertness: Awake, asleep  AEDs during EEG study:  LEV, LCM, Phenobarb, GBP  Technical aspects: This EEG study was done with scalp electrodes positioned according to the 10-20 International system of electrode placement. Electrical activity was reviewed with band pass filter of 1-70Hz , sensitivity of 7 uV/mm, display speed of 74mm/sec with a 60Hz  notched filter applied as appropriate. EEG data were recorded continuously and digitally stored.  Video monitoring was available and reviewed as appropriate.  Description: The posterior dominant rhythm consists of 8-9 Hz activity of moderate voltage (25-35 uV) seen predominantly in posterior head regions, symmetric and reactive to eye opening and eye closing. Sleep was characterized by sleep spindles (12 to 14 Hz), maximal frontocentral region. EEG showed continuous generalized 3 to 5 Hz theta- delta slowing admixed with 13-15hz  beta activity.  Hyperventilation and photic stimulation were not performed.      ABNORMALITY - Continuous slow, generalized   IMPRESSION: This study is suggestive of moderate diffuse encephalopathy. No seizures or epileptiform discharges were seen throughout the recording.   Please note that lack of epileptiform activity during interictal EEG does not exclude the diagnosis of epilepsy.     Laterrica Libman O Datron Brakebill

## 2023-12-27 NOTE — Progress Notes (Signed)
 EEG complete - results pending

## 2023-12-27 NOTE — ED Notes (Signed)
 EEG at bedside.

## 2023-12-27 NOTE — ED Notes (Signed)
 C-collar placed on patient at the bridge due to neck pain.

## 2023-12-28 DIAGNOSIS — Z8669 Personal history of other diseases of the nervous system and sense organs: Secondary | ICD-10-CM | POA: Diagnosis not present

## 2023-12-28 DIAGNOSIS — R4182 Altered mental status, unspecified: Secondary | ICD-10-CM | POA: Diagnosis not present

## 2023-12-28 DIAGNOSIS — I639 Cerebral infarction, unspecified: Secondary | ICD-10-CM

## 2023-12-28 DIAGNOSIS — Z8673 Personal history of transient ischemic attack (TIA), and cerebral infarction without residual deficits: Secondary | ICD-10-CM | POA: Diagnosis not present

## 2023-12-28 DIAGNOSIS — S42351A Displaced comminuted fracture of shaft of humerus, right arm, initial encounter for closed fracture: Secondary | ICD-10-CM

## 2023-12-28 LAB — BASIC METABOLIC PANEL WITH GFR
Anion gap: 9 (ref 5–15)
BUN: 13 mg/dL (ref 8–23)
CO2: 23 mmol/L (ref 22–32)
Calcium: 8.4 mg/dL — ABNORMAL LOW (ref 8.9–10.3)
Chloride: 104 mmol/L (ref 98–111)
Creatinine, Ser: 1.16 mg/dL (ref 0.61–1.24)
GFR, Estimated: >60 mL/min (ref >60)
Glucose, Bld: 101 mg/dL — ABNORMAL HIGH (ref 70–99)
Potassium: 4.2 mmol/L (ref 3.5–5.1)
Sodium: 136 mmol/L (ref 135–145)

## 2023-12-28 LAB — CBC
HCT: 41.7 % (ref 39.0–52.0)
Hemoglobin: 13.1 g/dL (ref 13.0–17.0)
MCH: 27.5 pg (ref 26.0–34.0)
MCHC: 31.4 g/dL (ref 30.0–36.0)
MCV: 87.4 fL (ref 80.0–100.0)
Platelets: 191 10*3/uL (ref 150–400)
RBC: 4.77 MIL/uL (ref 4.22–5.81)
RDW: 15.9 % — ABNORMAL HIGH (ref 11.5–15.5)
WBC: 6.8 10*3/uL (ref 4.0–10.5)
nRBC: 0 % (ref 0.0–0.2)

## 2023-12-28 LAB — ETHANOL: Alcohol, Ethyl (B): <15 mg/dL (ref <15)

## 2023-12-28 LAB — CK: Total CK: 227 U/L (ref 49–397)

## 2023-12-28 LAB — PHENOBARBITAL LEVEL: Phenobarbital: 35.4 ug/mL (ref 15.0–40.0)

## 2023-12-28 NOTE — Progress Notes (Signed)
 Transition of Care [facility]) - Inpatient Brief Assessment   Patient Details  Name: Seth [name] MRN: [id number] Date of Birth: Jan 31, 1956  [facility] CM/SW Contact:    Dane Dung, RN Phone Number: 12/28/2023, 3:36 PM   Clinical Narrative: CM met with the patient at the bedside to discuss TOC needs.  The patient lives with his sister, Seth [name] and fell at home resulting in right humerus fx - pending surgery for tomorrow.  Patient is agreeable to SNF work and placement post surgery.  SNF work up will be started and patient faxed out in the hub for bed offers.  Patient has been at [facility] recently.  Pending surgery for Right humerus repair tomorrow.   Transition of Care Asessment: Insurance and Status: (P) Insurance coverage has been reviewed Patient has primary care physician: (P) Yes Home environment has been reviewed: (P) From home with sister, Seth [name] Prior level of function:: (P) RW Prior/Current Home Services: (P) No current home services Social Drivers of Health Review: (P) SDOH reviewed needs interventions Readmission risk has been reviewed: (P) Yes Transition of care needs: (P) transition of care needs identified, TOC will continue to follow

## 2023-12-28 NOTE — Anesthesia Preprocedure Evaluation (Signed)
 Anesthesia Evaluation  Patient identified by MRN, date of birth, ID band Patient confused    Reviewed: Allergy & Precautions, NPO status , Patient's Chart, lab work & pertinent test results, reviewed documented beta blocker date and time   History of Anesthesia Complications Negative for: history of anesthetic complications  Airway Mallampati: III  TM Distance: >3 FB Neck ROM: Full    Dental  (+) Missing   Pulmonary COPD, Current Smoker and Patient abstained from smoking.   Pulmonary exam normal        Cardiovascular hypertension, Pt. on medications and Pt. 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), Residual Symptoms    GI/Hepatic ,GERD  Medicated and Controlled,,(+)     substance abuse  alcohol use  Endo/Other  negative endocrine ROS    Renal/GU negative Renal ROS     Musculoskeletal  Gout    Abdominal   Peds  Hematology negative hematology ROS (+)   Anesthesia Other Findings   Reproductive/Obstetrics                             Anesthesia Physical Anesthesia Plan  ASA: 3  Anesthesia Plan: General   Post-op Pain Management: Tylenol  PO (pre-op)* and Regional block*   Induction: Intravenous  PONV Risk Score and Plan: 1 and Treatment may vary due to age or medical condition and Ondansetron   Airway Management Planned: Oral ETT  Additional Equipment: None  Intra-op Plan:   Post-operative Plan: Extubation in OR  Informed Consent: I have reviewed the patients History and Physical, chart, labs and discussed the procedure including the risks, benefits and alternatives for the proposed anesthesia with the patient or authorized representative who has indicated his/her understanding and acceptance.     Dental advisory given  and Consent reviewed with POA  Plan Discussed with: CRNA and Anesthesiologist  Anesthesia Plan Comments: (Discussed consent for general anesthesia and regional block with daughter, [place], on telephone due to patient's confusion)        Anesthesia Quick Evaluation

## 2023-12-28 NOTE — Progress Notes (Addendum)
 HD#1 Subjective:  Overnight Events: No events overnight.  Examined at the bedside, he has no recollection of events leading to his fall.  Patient's daughter not at bedside. Seen by ortho, planned for ORIF on 5/3.   Objective:  Vital signs in last 24 hours: Vitals:   12/28/23 0451 12/28/23 0600 12/28/23 0846 12/28/23 0907  BP: 119/75 105/67  111/70  Pulse: 74 79  73  Resp: 17 18  16   Temp: 97.9 F (36.6 C)  97.9 F (36.6 C)   TempSrc: Oral  Oral   SpO2: 99% 100%  100%  Weight:      Height:       Supplemental O2: Room Air SpO2: 100 %   Physical Exam:   General: Sitting in bed, NAD.   CV: RRR. No m/r/g. No LE edema Pulmonary: Lungs CTAB. Normal effort. No wheezing or rales. Neuro: Oriented to person, but not place or time. No focal weakness.  Speech sometimes hard to understand.  Filed Weights   12/27/23 1400  Weight: 75.6 kg    No intake or output data in the 24 hours ending 12/28/23 1027   Recent Labs    12/27/23 1406  GLUCAP 77     Pertinent Labs:    Latest Ref Rng & Units 12/27/2023    3:15 PM 12/27/2023    2:10 PM 11/23/2023    8:31 AM  CBC  WBC 4.0 - 10.5 K/uL 10.9   5.4   Hemoglobin 13.0 - 17.0 g/dL 09.6  04.5  40.9   Hematocrit 39.0 - 52.0 % 45.9  47.0  45.3   Platelets 150 - 400 K/uL 224   200        Latest Ref Rng & Units 12/28/2023    7:09 AM 12/27/2023    3:15 PM 12/27/2023    2:10 PM  CMP  Glucose 70 - 99 mg/dL 811  85  76   BUN 8 - 23 mg/dL 13  9  12    Creatinine 0.61 - 1.24 mg/dL 9.14  7.82  9.56   Sodium 135 - 145 mmol/L 136  138  137   Potassium 3.5 - 5.1 mmol/L 4.2  3.9  4.2   Chloride 98 - 111 mmol/L 104  107  108   CO2 22 - 32 mmol/L 23  23    Calcium  8.9 - 10.3 mg/dL 8.4  9.1    Total Protein 6.5 - 8.1 g/dL  6.9    Total Bilirubin 0.0 - 1.2 mg/dL  0.7    Alkaline Phos 38 - 126 U/L  144    AST 15 - 41 U/L  19    ALT 0 - 44 U/L  13      Imaging: MR BRAIN WO CONTRAST Result Date: 12/27/2023 CLINICAL DATA:  Initial evaluation  for acute mental status change, unknown cause. EXAM: MRI HEAD WITHOUT CONTRAST TECHNIQUE: Multiplanar, multiecho pulse sequences of the brain and surrounding structures were obtained without intravenous contrast. COMPARISON:  CT from earlier the same day. FINDINGS: Brain: Diffuse prominence of the CSF containing spaces compatible generalized cerebral atrophy. Patchy T2/FLAIR hyperintensity involving the periventricular deep white matter both cerebral hemispheres, consistent chronic small vessel ischemic disease, mild in nature. Multiple scattered remote bilateral cerebellar infarcts. Cerebral volume loss about the left cerebral hemisphere with associated superficial siderosis, stable. Superimposed chronic microhemorrhage at the anterior left temporal lobe also stable. Associated ex vacuo dilatation of the left lateral ventricle. No abnormal foci of restricted diffusion to suggest  acute or subacute ischemia. Gray-white matter differentiation maintained. No acute intracranial hemorrhage. 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 No other mass lesion, mass effect or midline shift. Ventricular prominence with ex vacuo dilatation of the left lateral ventricle, stable. No extra-axial fluid collection. Pituitary gland within normal limits. Vascular: Major intracranial vascular flow voids are maintained. Signal abnormality about the partially visualized distal cervical left ICA related to previously identified pseudoaneurysm, partially visualized (series 8, image 1). Skull and upper cervical spine: Cranial junction within normal limits. Postsurgical changes partially visualize within the upper cervical spine. Bone marrow signal intensity normal. No scalp soft tissue abnormality. Sinuses/Orbits: Globes orbital soft tissues within normal limits. Scattered mucosal thickening noted about the paranasal sinuses. No significant mastoid effusion. Other: None. IMPRESSION: 1. No acute intracranial  abnormality. 2. Cerebral atrophy with mild chronic microvascular ischemic disease, with multiple scattered remote bilateral cerebellar infarcts. 3. Asymmetric volume loss about the left cerebral hemisphere with associated chronic superficial siderosis, stable. 4. 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 5. Signal abnormality about the partially visualized distal cervical left ICA related to previously identified pseudoaneurysm, partially visualized. Electronically Signed   By: Virgia Griffins M.D.   On: 12/27/2023 18:31   CT HUMERUS RIGHT WO CONTRAST Result Date: 12/27/2023 CLINICAL DATA:  Upper arm trauma EXAM: CT OF THE RIGHT HUMERUS WITHOUT CONTRAST TECHNIQUE: Multidetector CT imaging was performed according to the standard protocol. Multiplanar CT image reconstructions were also generated. RADIATION DOSE REDUCTION: This exam was performed according to the departmental dose-optimization program which includes automated exposure control, adjustment of the mA and/or kV according to patient size and/or use of iterative reconstruction technique. COMPARISON:  Right humerus radiographs dated 12/27/2023 at 2:40 p.m. FINDINGS: Bones/Joint/Cartilage Comminuted oblique long segment fracture of the right proximal humerus extending from the humeral head and neck junction distally through the mid diaphysis. There is approximately 1.5 cm of medial displacement of the distal fracture component. There is approximately 25-30 degrees of apex anterior angulation involving the comminuted fracture fragment and the distal shaft component. No dislocation. The glenohumeral and acromioclavicular joints are anatomically aligned. Nondisplaced fractures of the right seventh, eighth, and ninth ribs. No visualized pneumothorax. Ligaments Ligaments are suboptimally evaluated by CT. Muscles and Tendons Intramuscular edema and soft tissue swelling surrounding the proximal humeral fracture described above.  No sizable intramuscular fluid collection or hematoma. Soft tissue No fluid collection or hematoma.  No soft tissue mass. IMPRESSION: 1. Acute comminuted, displaced, and angulated oblique fracture of the right proximal humerus extending from the humeral head and neck junction through the mid diaphysis. 2. Nondisplaced fractures of the right seventh, eighth, and ninth ribs. No visualized pneumothorax. Electronically Signed   By: Mannie Seek M.D.   On: 12/27/2023 17:42   EEG adult Result Date: 12/27/2023 Arleene Lack, MD     12/27/2023  5:43 PM Patient Name: ENIOLA JOSWICK MRN: [id number] Epilepsy Attending: Arleene Lack Referring Physician/Provider: Audrene Lease, NP Date: 12/27/2023 Duration: 23.29 mins Patient history: 69 yo with pmhx significant for depression, htn, cva, seizures, and copd. Pt lives with his sister and she found him on the ground. His right arm was bent at a strange angle. He has a hx of prior cva with slurred speech and right arm weakness. EEG to evaluate for seizure Level of alertness: Awake, asleep AEDs during EEG study:  LEV, LCM, Phenobarb, GBP Technical aspects: This EEG study was done with scalp electrodes positioned according  to the 10-20 International system of electrode placement. Electrical activity was reviewed with band pass filter of 1-70Hz , sensitivity of 7 uV/mm, display speed of 86mm/sec with a 60Hz  notched filter applied as appropriate. EEG data were recorded continuously and digitally stored.  Video monitoring was available and reviewed as appropriate. Description: The posterior dominant rhythm consists of 8-9 Hz activity of moderate voltage (25-35 uV) seen predominantly in posterior head regions, symmetric and reactive to eye opening and eye closing. Sleep was characterized by sleep spindles (12 to 14 Hz), maximal frontocentral region. EEG showed continuous generalized 3 to 5 Hz theta- delta slowing admixed with 13-15hz  beta activity.  Hyperventilation and photic  stimulation were not performed.    ABNORMALITY - Continuous slow, generalized  IMPRESSION: This study is suggestive of moderate diffuse encephalopathy. No seizures or epileptiform discharges were seen throughout the recording.  Please note that lack of epileptiform activity during interictal EEG does not exclude the diagnosis of epilepsy.   Arleene Lack   DG Pelvis Portable Result Date: 12/27/2023 CLINICAL DATA:  Marvell Slider, code stroke, right arm pain EXAM: PORTABLE PELVIS 1-2 VIEWS COMPARISON:  02/13/2023 FINDINGS: Single frontal view of the pelvis includes both hips. No acute displaced fracture, subluxation, or dislocation. Mild symmetrical bilateral hip osteoarthritis unchanged. The sacroiliac joints are normal. IMPRESSION: 1. No acute displaced fracture.  Stable osteoarthritis. Electronically Signed   By: Bobbye Burrow M.D.   On: 12/27/2023 15:37   DG Chest Portable 1 View Result Date: 12/27/2023 CLINICAL DATA:  Marvell Slider, right upper arm pain EXAM: PORTABLE CHEST 1 VIEW COMPARISON:  02/13/2023 FINDINGS: Single frontal view of the chest excludes the left costophrenic angle by collimation. No acute airspace disease, effusion, or pneumothorax. The cardiac silhouette is unremarkable. Comminuted proximal right humeral fracture extending from the humeral neck through the proximal humeral diaphysis. IMPRESSION: 1. Comminuted proximal right humeral fracture. Please see dedicated right humeral study. 2. No acute intrathoracic process. Electronically Signed   By: Bobbye Burrow M.D.   On: 12/27/2023 15:37   DG Humerus Right Result Date: 12/27/2023 CLINICAL DATA:  Marvell Slider, right arm pain EXAM: RIGHT HUMERUS - 2+ VIEW COMPARISON:  None Available. FINDINGS: Frontal and lateral views of the right humerus are obtained on 3 images. There is a long segment comminuted displaced fracture of the proximal humerus. The fracture line extends through the humeral neck and greater tuberosity superiorly, with inferior extent through the  proximal humeral diaphysis. Ventral angulation at the fracture site. Anatomic alignment of the shoulder and elbow. Soft tissue swelling throughout the upper arm. Visualized portions of the right chest are clear. IMPRESSION: 1. Long segment comminuted displaced fracture of the proximal humerus, extending from the humeral neck through the proximal humeral diaphysis. Ventral angulation at the fracture site. Electronically Signed   By: Bobbye Burrow M.D.   On: 12/27/2023 15:36   CT Cervical Spine Wo Contrast Result Date: 12/27/2023 CLINICAL DATA:  Neck trauma EXAM: CT CERVICAL SPINE WITHOUT CONTRAST TECHNIQUE: Multidetector CT imaging of the cervical spine was performed without intravenous contrast. Multiplanar CT image reconstructions were also generated. RADIATION DOSE REDUCTION: This exam was performed according to the departmental dose-optimization program which includes automated exposure control, adjustment of the mA and/or kV according to patient size and/or use of iterative reconstruction technique. COMPARISON:  11/21/2023, 10/09/2023 FINDINGS: Alignment: Alignment is grossly anatomic. Skull base and vertebrae: No acute fracture. No primary bone lesion or focal pathologic process. Soft tissues and spinal canal: No prevertebral fluid or swelling. No visible canal hematoma.  Disc levels: Prior C3-4 ACDF. Multilevel facet hypertrophy greatest at C2-3. Mild C4-5 and C5-6 spondylosis. Upper chest: Airway is patent. Emphysematous changes at the lung apices. Other: Reconstructed images demonstrate no additional findings. IMPRESSION: 1. No acute cervical spine fracture. 2. Stable C3-4 ACDF. 3. Stable mild multilevel spondylosis and facet hypertrophy as above. Electronically Signed   By: Bobbye Burrow M.D.   On: 12/27/2023 15:34   CT HEAD CODE STROKE WO CONTRAST Result Date: 12/27/2023 CLINICAL DATA:  Code stroke. Neuro deficit, concern for stroke, slurred speech. EXAM: CT HEAD WITHOUT CONTRAST TECHNIQUE: Contiguous  axial images were obtained from the base of the skull through the vertex without intravenous contrast. RADIATION DOSE REDUCTION: This exam was performed according to the departmental dose-optimization program which includes automated exposure control, adjustment of the mA and/or kV according to patient size and/or use of iterative reconstruction technique. COMPARISON:  MRI head 11/22/2023. FINDINGS: Brain: No acute intracranial hemorrhage. No CT evidence of acute infarct. Nonspecific hypoattenuation in the periventricular and subcortical white matter favored to reflect chronic microvascular ischemic changes. Remote lacunar infarcts in the right thalamus. Additional remote infarcts in the bilateral cerebellum. 1.2 cm slightly hyperattenuating parafalcine mass abutting the parasagittal right frontal lobe likely reflecting a meningioma. No edema, mass effect, or midline shift. The basilar cisterns are patent. Ventricles: Similar moderate ventriculomegaly likely related to parenchymal volume loss. Similar asymmetry of the left lateral ventricle. Vascular: Atherosclerotic calcifications of the carotid siphons. No hyperdense vessel. Skull: No acute or aggressive finding. Orbits: Orbits are symmetric. Sinuses: Mild mucosal thickening in the ethmoid and maxillary sinuses as well as the right frontal sinus. Other: Mastoid air cells are clear. 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 ASPECTS [facility] Stroke Program Early CT Score) - Ganglionic level infarction (caudate, lentiform nuclei, internal capsule, insula, M1-M3 cortex): 7 - Supraganglionic infarction (M4-M6 cortex): 3 Total score (0-10 with 10 being normal): 10 IMPRESSION: 1. No CT evidence of acute intracranial abnormality. 2. Moderate chronic microvascular ischemic changes and parenchymal volume loss. 3. Remote infarcts in the right thalamus and bilateral cerebellum. 4. 1.2  cm right parafalcine meningioma again noted. 5. 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 6. ASPECTS is 10 Finding of no acute abnormality and aspects score results were communicated to Dr. Lindzen At 2:46 pm on 12/27/2023 by text page via the [facility] messaging system. Electronically Signed   By: Denny Flack M.D.   On: 12/27/2023 14:51    Assessment/Plan:   Principal Problem:   Altered mental status Active Problems:   Closed displaced comminuted fracture of shaft of right humerus   Cerebrovascular accident (CVA) (HCC)   Patient Summary: Sushant Vasas is a 68 year old male with a history of hypertension, COPD, BPH, depression, and seizures presenting after a fall and altered mental status.   #Altered mental status #History of seizures Unclear what is his baseline but his mental status improved this morning, he is more awake.  No focal deficit on exam. He has no recollection of the event leading to his fall, so unclear if this was a seizure breakthrough at home.  EEG thankfully has been negative for any ongoing seizures.Aaron Aas  He is on Keppra , Vimpat  and phenobarbital  at home, follow-up on serum levels.  - Neurology following, appreciate recs. - Restarted Keppra  2000 mg, Vimpat  100 mg and phenobarbital  64.8.    #Bilateral cerebral infarcts #HLD  Prior  history of right-sided weakness.  CT of the head, and MRI of the brain negative for new stroke. - Continue aspirin  81 mg daily and Lipitor 40 mg daily.   #Fracture of proximal right humerus X-ray notable for long .segment comminuted displaced fracture of the proximal humerus, extending from the humeral neck through the proximal humeral diaphysis.  Ortho following, discussed with patient's daughter about treatment options, patient's POA has strong preference for surgery because she feels he will be noncompliant with sling.  Tentative plan for surgery 5/30 with orthopedics. -Ortho  following, appreciate recs. - N.p.o. at midnight -Nonweightbearing right upper extremity, sling -Plan for surgery 5/3  Diet: Regular diet IVF: PO intake VTE: enoxaparin  (LOVENOX ) injection 40 mg Start: 12/28/23 1000 Code: Full PT/OT: Pending ID:  Anti-infectives (From admission, onward)    None       Anticipated discharge to medical stabilization.  Marni Sins, MD 12/28/2023, 10:27 AM Pager: [phone] [facility]

## 2023-12-28 NOTE — Progress Notes (Signed)
 Orthopaedic Surgery Progress Note  Comfortable in the ED. Conversational but not insightful about his situation.   Exam: Right Upper Extremity: Skin intact SILT Ax/rad/med/ulnar + motor deltoid/grip/EPL/IO Palpable radial pulse   Assessment: 68 y.o. male with right humeral shaft fracture  I discussed by phone with his daughter treatment options including both surgical and nonsurgical.  Her preference was for surgery because he feels as though he would be noncompliant with sling or brace use he is also high fall risk and reliant on using a [place].  For these reasons I agree that internal fixation would likely give him a more reliable recovery.  We talked about associated risks including nonunion, malunion, neurovascular injury, need for revision surgery or hardware failure in the future.  She thought that it would be in his best interest to proceed.  Plan: Nonweightbearing right upper extremity, sling Plan for surgery tomorrow, 5/3 please keep n.p.o. at midnight, okay for diet today

## 2023-12-28 NOTE — Evaluation (Signed)
 Physical Therapy Evaluation Patient Details Name: Seth [name] MRN: [id number] DOB: December 05, 1955 Today's Date: 12/28/2023  History of Present Illness  68 y.o. male with right humeral shaft fracture post fall at home.  Nonweightbearing right upper extremity, sling with planned for surgery tomorrow, 5/3.  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  Clinical Impression  Patient presents with decreased mobility due to R shoulder fracture, immobilization and limited weight bearing.  She was previously able to ambulate with RW and complete some ADL's with sister's help.  He needed mod to max A for standing attempts and to get to recliner in the room.  Feel he will benefit from skilled PT in the acute setting and from post-acute inpatient rehab (<3 hours/day) prior to d/c home.       If plan is discharge home, recommend the following: A little help with walking and/or transfers;A little help with bathing/dressing/bathroom;Assistance with cooking/housework;Direct supervision/assist for medications management;Direct supervision/assist for financial management;Assist for transportation;Help with stairs or ramp for entrance;Supervision due to cognitive status   Can travel by private vehicle        Equipment Recommendations None recommended by PT  Recommendations for Other Services       Functional Status Assessment Patient has had a recent decline in their functional status and demonstrates the ability to make significant improvements in function in a reasonable and predictable amount of time.     Precautions / Restrictions Precautions Precautions: Fall Recall of Precautions/Restrictions: Impaired Required Braces or Orthoses: Sling;Other Brace Other Brace: states has prosthetic shoe at home for L symes amputation Restrictions Weight Bearing Restrictions Per Provider Order: Yes RUE Weight Bearing Per Provider Order: Non weight bearing       Mobility  Bed Mobility Overal bed mobility: Needs Assistance Bed Mobility: Supine to Sit     Supine to sit: HOB elevated, Min assist     General bed mobility comments: assist with HHA on L for lifting trunk, increased time to scoot to EOB with cues    Transfers   Equipment used: 1 person hand held assist Transfers: Sit to/from Stand, Bed to chair/wheelchair/BSC Sit to Stand: Max assist     Squat pivot transfers: Mod assist     General transfer comment: lifting help to stand and pt leaning posteriorly bracing legs against EOB and L foot sliding forward so assisted to sit on EOB; second time with chair close pt able to pivot with cues and mod A to recliner    Ambulation/Gait                  Stairs            Wheelchair Mobility     Tilt Bed    Modified Rankin (Stroke Patients Only)       Balance Overall balance assessment: Needs assistance   Sitting balance-[name] Scale: Fair Sitting balance - Comments: on EOB unsupported Postural control: Posterior lean Standing balance support: Single extremity supported Standing balance-[name] Scale: Zero Standing balance comment: unable to come to balance in standing with mod to max A                             Pertinent Vitals/Pain Pain Assessment Pain Assessment: Faces Faces Pain Scale: Hurts little more Pain Location: R shoulder with mobility Pain Descriptors / Indicators: Grimacing Pain Intervention(s): Monitored during session    Home Living Family/patient expects to  be discharged to:: Private residence Living Arrangements: Parent;Other relatives Available Help at Discharge: Family;Available 24 hours/day Type of Home: House Home Access: Ramped entrance (4 steps in front and pt uses both)     Alternate Level Stairs-Number of Steps: pt's room is down ~4-5 steps (B rails) to basement Home Layout: Multi-level Home Equipment: Grab bars - tub/shower;Grab bars - toilet;Rolling Johannsen (2  wheels);Cane - single point;Shower seat      Prior Function               Mobility Comments: ambulates with RW vs cane       Extremity/Trunk Assessment   Upper Extremity Assessment Upper Extremity Assessment: Defer to OT evaluation    Lower Extremity Assessment Lower Extremity Assessment: RLE deficits/detail;LLE deficits/detail RLE Deficits / Details: AROM WFL, strength grossly 4/5 throughout RLE Sensation: decreased light touch LLE Deficits / Details: midfoot amputation, some numbness to light touch; othewise strength 4/5 LLE Sensation: decreased light touch    Cervical / Trunk Assessment Cervical / Trunk Assessment: Kyphotic  Communication   Communication Communication: Impaired Factors Affecting Communication: Reduced clarity of speech    Cognition Arousal: Alert Behavior During Therapy: WFL for tasks assessed/performed   PT - Cognitive impairments: No family/caregiver present to determine baseline, Orientation, Initiation, Problem solving, Attention, Safety/Judgement   Orientation impairments: Time                   PT - Cognition Comments: states April and does not know day; knew in [place] and found down, but not sure if seizure, syncope or fall Following commands: Intact Following commands impaired: Only follows one step commands consistently, Follows one step commands with increased time     Cueing Cueing Techniques: Verbal cues, Tactile cues     General Comments General comments (skin integrity, edema, etc.): Lunch tray delivered and assisted pt to set up though he reported he could do it. reminded him cannot use R arm with sling    Exercises     Assessment/Plan    PT Assessment Patient needs continued PT services  PT Problem List Decreased strength;Decreased range of motion;Decreased activity tolerance;Decreased balance;Decreased mobility;Decreased coordination;Decreased cognition;Decreased knowledge of use of DME;Decreased safety  awareness;Decreased knowledge of precautions       PT Treatment Interventions DME instruction;Gait training;Functional mobility training;Therapeutic activities;Therapeutic exercise;Balance training;Neuromuscular re-education;Cognitive remediation;Patient/family education    PT Goals (Current goals can be found in the Care Plan section)  Acute Rehab PT Goals Patient Stated Goal: to walk PT Goal Formulation: With patient Time For Goal Achievement: 01/11/24 Potential to Achieve Goals: Fair    Frequency Min 2X/week     Co-evaluation               AM-PAC PT "6 Clicks" Mobility  Outcome Measure Help needed turning from your back to your side while in a flat bed without using bedrails?: A Little Help needed moving from lying on your back to sitting on the side of a flat bed without using bedrails?: A Little Help needed moving to and from a bed to a chair (including a wheelchair)?: A Lot Help needed standing up from a chair using your arms (e.g., wheelchair or bedside chair)?: Total Help needed to walk in hospital room?: Total Help needed climbing 3-5 steps with a railing? : Total 6 Click Score: 11    End of Session Equipment Utilized During Treatment: Gait belt;Other (comment) (sling) Activity Tolerance: Patient limited by fatigue Patient left: in chair;with call bell/phone within reach;with chair alarm set  PT Visit Diagnosis: Other abnormalities of gait and mobility (R26.89);Muscle weakness (generalized) (M62.81);History of falling (Z91.81)    Time: 5284-1324 PT Time Calculation (min) (ACUTE ONLY): 32 min   Charges:   PT Evaluation $PT Eval Moderate Complexity: 1 Mod PT Treatments $Therapeutic Activity: 8-22 mins PT General Charges $$ ACUTE PT VISIT: 1 Visit         Abigail Hoff, PT Acute Rehabilitation Services Office:[phone] 12/28/2023   Seth [name] 12/28/2023, 3:07 PM

## 2023-12-28 NOTE — ED Notes (Signed)
 Pt bed and brief changed and pt readjusted in bed.

## 2023-12-28 NOTE — Evaluation (Signed)
 Occupational Therapy Evaluation Patient Details Name: Seth [name] MRN: [id number] DOB: 11/21/1955 Today's Date: 12/28/2023   History of Present Illness   68 y.o. male with right humeral shaft fracture post fall at home.  Nonweightbearing right upper extremity, sling with planned for surgery tomorrow, 5/3.  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     Clinical Impressions Patient admitted for the diagnosis above.  PTA it appears he lives with his mother and a granddaughter.  Inconsistent responses to PLOF and home description.  Patient planned for humeral sx 5/3, and OT will continue to follow in the acute setting to address deficits, and assist with discharge planning.  Currently needing Max A for ADL and mobility and Patient will benefit from continued inpatient follow up therapy, <3 hours/day.     If plan is discharge home, recommend the following:   Two people to help with walking and/or transfers;A lot of help with bathing/dressing/bathroom;Assist for transportation;Assistance with cooking/housework     Functional Status Assessment   Patient has had a recent decline in their functional status and demonstrates the ability to make significant improvements in function in a reasonable and predictable amount of time.     Equipment Recommendations   Tub/shower bench;BSC/3in1     Recommendations for Other Services         Precautions/Restrictions   Precautions Precautions: Fall Recall of Precautions/Restrictions: Impaired Required Braces or Orthoses: Sling Restrictions Weight Bearing Restrictions Per Provider Order: Yes RUE Weight Bearing Per Provider Order: Non weight bearing     Mobility Bed Mobility Overal bed mobility: Needs Assistance Bed Mobility: Supine to Sit, Sit to Supine     Supine to sit: Max assist Sit to supine: Max assist   General bed mobility comments: very lethargic     Transfers Overall transfer level: Needs assistance   Transfers: Sit to/from Stand Sit to Stand: Max assist           General transfer comment: unable to achieve full stand      Balance Overall balance assessment: Needs assistance Sitting-balance support: Feet supported, Single extremity supported Sitting balance-[name] Scale: Poor   Postural control: Posterior lean Standing balance support: Bilateral upper extremity supported Standing balance-[name] Scale: Poor                             ADL either performed or assessed with clinical judgement   ADL Overall ADL's : Needs assistance/impaired Eating/Feeding: Moderate assistance;Bed level   Grooming: Wash/dry hands;Wash/dry face;Moderate assistance;Bed level   Upper Body Bathing: Moderate assistance;Bed level   Lower Body Bathing: Maximal assistance;Bed level   Upper Body Dressing : Maximal assistance;Sitting   Lower Body Dressing: Maximal assistance;Bed level   Toilet Transfer: Maximal assistance;+2 for physical assistance                   Vision   Vision Assessment?: No apparent visual deficits     Perception Perception: Not tested       Praxis Praxis: Not tested       Pertinent Vitals/Pain Pain Assessment Pain Assessment: Faces Faces Pain Scale: Hurts little more Pain Location: R shoulder with bedmobility Pain Descriptors / Indicators: Grimacing Pain Intervention(s): Monitored during session, Premedicated before session     Extremity/Trunk Assessment Upper Extremity Assessment Upper Extremity Assessment: Left hand dominant;RUE deficits/detail RUE Deficits / Details: Unstable fracture RUE Sensation: WNL   Lower Extremity Assessment Lower  Extremity Assessment: Defer to PT evaluation   Cervical / Trunk Assessment Cervical / Trunk Assessment: Kyphotic   Communication Communication Communication: Impaired Factors Affecting Communication: Reduced clarity of speech    Cognition Arousal: Lethargic Behavior During Therapy: Flat affect Cognition: Difficult to assess Difficult to assess due to: Level of arousal                             Following commands: Intact       Cueing  General Comments       VSS on RA   Exercises     Shoulder Instructions      Home Living Family/patient expects to be discharged to:: Private residence Living Arrangements: Parent;Other relatives Available Help at Discharge: Family;Available 24 hours/day Type of Home: House Home Access: Ramped entrance     Home Layout: Multi-level;Able to live on main level with bedroom/bathroom Alternate Level Stairs-Number of Steps: pt's room is down ~4-5 steps (B rails) to basement Alternate Level Stairs-Rails: Right;Left;Can reach both Bathroom Shower/Tub: Tub/shower unit   Bathroom Toilet: Handicapped height Bathroom Accessibility: No   Home Equipment: Agricultural consultant (2 wheels);Cane - single point   Additional Comments: Impaired cognition during assessment.  Very lethargic.      Prior Functioning/Environment Prior Level of Function : Needs assist             Mobility Comments: ambulates with RW ADLs Comments: Reports he can bath/dress/toilet self but past charting suggests sister reported he needs A for ADL and iADL.    OT Problem List: Decreased strength;Decreased range of motion;Impaired balance (sitting and/or standing);Increased edema;Pain;Decreased knowledge of precautions;Decreased safety awareness   OT Treatment/Interventions: Self-care/ADL training;Therapeutic activities;Therapeutic exercise;Patient/family education;Balance training;DME and/or AE instruction      OT Goals(Current goals can be found in the care plan section)   Acute Rehab OT Goals Patient Stated Goal: Go back home OT Goal Formulation: With patient Time For Goal Achievement: 01/11/24 Potential to Achieve Goals: Fair   OT Frequency:  Min 2X/week    Co-evaluation               AM-PAC OT "6 Clicks" Daily Activity     Outcome Measure Help from another person eating meals?: A Lot Help from another person taking care of personal grooming?: A Lot Help from another person toileting, which includes using toliet, bedpan, or urinal?: A Lot Help from another person bathing (including washing, rinsing, drying)?: A Lot Help from another person to put on and taking off regular upper body clothing?: A Lot Help from another person to put on and taking off regular lower body clothing?: A Lot 6 Click Score: 12   End of Session Equipment Utilized During Treatment: Gait belt Nurse Communication: Mobility status  Activity Tolerance: Patient limited by lethargy Patient left: in bed;with call bell/phone within reach  OT Visit Diagnosis: Unsteadiness on feet (R26.81);Muscle weakness (generalized) (M62.81);History of falling (Z91.81);Pain Pain - Right/Left: Right Pain - part of body: Arm                Time: 8295-6213 OT Time Calculation (min): 20 min Charges:  OT General Charges $OT Visit: 1 Visit OT Evaluation $OT Eval Moderate Complexity: 1 Mod  12/28/2023  RP, OTR/L  Acute Rehabilitation Services  Office:  [phone]   Seth [name] 12/28/2023, 10:08 AM

## 2023-12-28 NOTE — Plan of Care (Signed)

## 2023-12-28 NOTE — NC FL2 (Signed)
 [place]  MEDICAID FL2 LEVEL OF CARE FORM     IDENTIFICATION  Patient Name: Seth [name] Birthdate: [birth year] Sex: male Admission Date (Current Location): 12/27/2023  [facility] and [place] Number:  Producer, television/film/video and Address:  [facility] [place], [address], [place], [place] [address]      Provider Number: [id number]  Attending Physician Name and Address:  Sandie Cross, MD  Relative Name and Phone Number:  Demoni Haenel, daughter - [phone]    Current Level of Care: Hospital Recommended Level of Care: Skilled Nursing Facility Prior Approval Number:    Date Approved/Denied:   PASRR Number: [id number]  Discharge Plan: SNF    Current Diagnoses: Patient Active Problem List   Diagnosis Date Noted   Closed displaced comminuted fracture of shaft of right humerus 12/28/2023   Cerebrovascular accident (CVA) (HCC) 12/28/2023   Altered mental status 12/27/2023   Generalized weakness 11/22/2023   General weakness 11/22/2023   Hyperlipidemia 12/20/2022   GERD (gastroesophageal reflux disease) 12/20/2022   Alcohol withdrawal syndrome without complication (HCC) 12/20/2022   Seizure (HCC) 11/29/2022   Seizure disorder (HCC) 10/18/2022   Asymptomatic bacteriuria 05/24/2022   HAP (hospital-acquired pneumonia) 05/23/2022   Multiple closed fractures of ribs of right side    Fall during current hospitalization 10/16/2021   Peripheral arterial disease (HCC)    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, not intractable, with status epilepticus (HCC)    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HCC)    Seizures (HCC) 05/17/2021   Pressure injury of skin 03/14/2021   Essential hypertension    Chronic alcohol use 01/01/2021   History of CVA with residual RUE weakness 01/01/2021   COVID-19 virus infection 09/14/2020   Acute osteomyelitis of left first metatarsal stump    AKI (acute  kidney injury) (HCC) 07/20/2020   Hypoglycemia 07/20/2020   Right sided weakness 02/19/2019   Hyperkalemia 02/19/2019   Left fibular fracture 02/19/2019   Alcohol use disorder 02/19/2019   Malnutrition of moderate degree 08/25/2017   Tobacco use disorder 08/23/2017   COPD (chronic obstructive pulmonary disease) (HCC) 08/23/2017    Orientation RESPIRATION BLADDER Height & Weight     Self, Time, Situation, Place  Normal External catheter Weight: 75.6 kg Height:  6\' 3"  (190.5 cm)  BEHAVIORAL SYMPTOMS/MOOD NEUROLOGICAL BOWEL NUTRITION STATUS      Continent Diet (See Discharge Summary)  AMBULATORY STATUS COMMUNICATION OF NEEDS Skin   Extensive Assist Verbally Normal                       Personal Care Assistance Level of Assistance  Bathing, Feeding, Dressing Bathing Assistance: Maximum assistance Feeding assistance: Limited assistance Dressing Assistance: Maximum assistance     Functional Limitations Info  Sight, Hearing, Speech Sight Info: Adequate Hearing Info: Adequate Speech Info: Impaired (slurred)    SPECIAL CARE FACTORS FREQUENCY  PT (By licensed PT), OT (By licensed OT)     PT Frequency: 5 x per week OT Frequency: 5 x per week            Contractures Contractures Info: Not present    Additional Factors Info  Code Status, Allergies, Psychotropic Code Status Info: Full code Allergies Info: NKDA           Current Medications (12/28/2023):  This is the current hospital active medication list Current Facility-Administered Medications  Medication Dose Route Frequency Provider Last Rate Last Admin   aspirin  EC tablet  81 mg  81 mg Oral Daily Nooruddin, Saad, MD   81 mg at 12/28/23 0844   enoxaparin  (LOVENOX ) injection 40 mg  40 mg Subcutaneous Q24H Nooruddin, Saad, MD   40 mg at 12/28/23 1610   lacosamide  (VIMPAT ) tablet 100 mg  100 mg Oral BID Nooruddin, Saad, MD   100 mg at 12/28/23 9604   levETIRAcetam  (KEPPRA ) tablet 2,000 mg  2,000 mg Oral BID  Nooruddin, Saad, MD   2,000 mg at 12/28/23 0844   PHENobarbital  (LUMINAL) tablet 64.8 mg  64.8 mg Oral BID Nooruddin, Saad, MD   64.8 mg at 12/28/23 5409     Discharge Medications: Please see discharge summary for a list of discharge medications.  Relevant Imaging Results:  Relevant Lab Results:   Additional Information SSN:  [id number] [place], [place]

## 2023-12-29 ENCOUNTER — Encounter (HOSPITAL_COMMUNITY)
Admission: EM | Disposition: A | Discharge status: Skilled Nursing Facility | Payer: Self-pay | Source: Home / Self Care | Attending: Emergency Medicine

## 2023-12-29 ENCOUNTER — Observation Stay (HOSPITAL_COMMUNITY): Admitting: Anesthesiology

## 2023-12-29 ENCOUNTER — Encounter (HOSPITAL_COMMUNITY): Payer: Self-pay | Admitting: Infectious Diseases

## 2023-12-29 ENCOUNTER — Observation Stay (HOSPITAL_COMMUNITY)

## 2023-12-29 DIAGNOSIS — S42301A Unspecified fracture of shaft of humerus, right arm, initial encounter for closed fracture: Secondary | ICD-10-CM | POA: Diagnosis not present

## 2023-12-29 DIAGNOSIS — J449 Chronic obstructive pulmonary disease, unspecified: Secondary | ICD-10-CM | POA: Diagnosis not present

## 2023-12-29 DIAGNOSIS — F17210 Nicotine dependence, cigarettes, uncomplicated: Secondary | ICD-10-CM

## 2023-12-29 DIAGNOSIS — I10 Essential (primary) hypertension: Secondary | ICD-10-CM | POA: Diagnosis not present

## 2023-12-29 HISTORY — PX: ORIF HUMERUS FRACTURE: SHX2126

## 2023-12-29 LAB — BASIC METABOLIC PANEL WITH GFR
Anion gap: 11 (ref 5–15)
BUN: 13 mg/dL (ref 8–23)
CO2: 22 mmol/L (ref 22–32)
Calcium: 8.5 mg/dL — ABNORMAL LOW (ref 8.9–10.3)
Chloride: 105 mmol/L (ref 98–111)
Creatinine, Ser: 1.03 mg/dL (ref 0.61–1.24)
GFR, Estimated: >60 mL/min (ref >60)
Glucose, Bld: 111 mg/dL — ABNORMAL HIGH (ref 70–99)
Potassium: 4.1 mmol/L (ref 3.5–5.1)
Sodium: 138 mmol/L (ref 135–145)

## 2023-12-29 LAB — CBC
HCT: 34.1 % — ABNORMAL LOW (ref 39.0–52.0)
Hemoglobin: 10.9 g/dL — ABNORMAL LOW (ref 13.0–17.0)
MCH: 28.2 pg (ref 26.0–34.0)
MCHC: 32 g/dL (ref 30.0–36.0)
MCV: 88.1 fL (ref 80.0–100.0)
Platelets: 154 10*3/uL (ref 150–400)
RBC: 3.87 MIL/uL — ABNORMAL LOW (ref 4.22–5.81)
RDW: 16 % — ABNORMAL HIGH (ref 11.5–15.5)
WBC: 11 10*3/uL — ABNORMAL HIGH (ref 4.0–10.5)
nRBC: 0 % (ref 0.0–0.2)

## 2023-12-29 LAB — LEVETIRACETAM LEVEL: Levetiracetam Lvl: 119 ug/mL — ABNORMAL HIGH (ref 10.0–40.0)

## 2023-12-29 SURGERY — OPEN REDUCTION INTERNAL FIXATION (ORIF) HUMERAL SHAFT FRACTURE
Anesthesia: General | Laterality: Right

## 2023-12-29 MED ORDER — ONDANSETRON HCL 4 MG/2ML IJ SOLN: 4.0000 mg | Freq: Once | INTRAMUSCULAR | Status: DC | PRN

## 2023-12-29 MED ORDER — LACTATED RINGERS IV SOLN: INTRAVENOUS | Status: DC

## 2023-12-29 MED ORDER — ONDANSETRON HCL 4 MG/2ML IJ SOLN
INTRAMUSCULAR | Status: DC | PRN
  Administered 2023-12-29: 4 mg via INTRAVENOUS

## 2023-12-29 MED ORDER — CARVEDILOL 6.25 MG PO TABS
6.2500 mg | ORAL_TABLET | Freq: Two times a day (BID) | ORAL | Status: DC
  Administered 2023-12-29 – 2024-01-01 (×7): 6.25 mg via ORAL
  Filled 2023-12-29 (×7): qty 1

## 2023-12-29 MED ORDER — PHENYLEPHRINE 80 MCG/ML (10ML) SYRINGE FOR IV PUSH (FOR BLOOD PRESSURE SUPPORT)
PREFILLED_SYRINGE | INTRAVENOUS | Status: DC | PRN
  Administered 2023-12-29 (×3): 160 ug via INTRAVENOUS
  Administered 2023-12-29: 80 ug via INTRAVENOUS
  Administered 2023-12-29: 160 ug via INTRAVENOUS
  Administered 2023-12-29: 80 ug via INTRAVENOUS

## 2023-12-29 MED ORDER — DEXAMETHASONE SODIUM PHOSPHATE 10 MG/ML IJ SOLN
INTRAMUSCULAR | Status: DC | PRN
  Administered 2023-12-29: 10 mg via INTRAVENOUS

## 2023-12-29 MED ORDER — MIDAZOLAM HCL 2 MG/2ML IJ SOLN
INTRAMUSCULAR | Status: AC
  Filled 2023-12-29: qty 2

## 2023-12-29 MED ORDER — TRANEXAMIC ACID-NACL 1000-0.7 MG/100ML-% IV SOLN
1000.0000 mg | INTRAVENOUS | Status: AC
Start: 2023-12-29 — End: 2023-12-29
  Administered 2023-12-29: 1000 mg via INTRAVENOUS

## 2023-12-29 MED ORDER — ACETAMINOPHEN 500 MG PO TABS
1000.0000 mg | ORAL_TABLET | Freq: Four times a day (QID) | ORAL | Status: DC | PRN
  Administered 2023-12-29 – 2023-12-31 (×2): 1000 mg via ORAL
  Filled 2023-12-29 (×2): qty 2

## 2023-12-29 MED ORDER — EPHEDRINE 5 MG/ML INJ
INTRAVENOUS | Status: AC
  Filled 2023-12-29: qty 5

## 2023-12-29 MED ORDER — FENTANYL CITRATE (PF) 250 MCG/5ML IJ SOLN
INTRAMUSCULAR | Status: AC
  Filled 2023-12-29: qty 5

## 2023-12-29 MED ORDER — ORAL CARE MOUTH RINSE: 15.0000 mL | Freq: Once | OROMUCOSAL | Status: AC

## 2023-12-29 MED ORDER — CEFAZOLIN SODIUM-DEXTROSE 2-4 GM/100ML-% IV SOLN
INTRAVENOUS | Status: AC
  Filled 2023-12-29: qty 100

## 2023-12-29 MED ORDER — PHENOBARBITAL 32.4 MG PO TABS
32.4000 mg | ORAL_TABLET | Freq: Two times a day (BID) | ORAL | Status: DC
  Administered 2023-12-29 – 2024-01-01 (×7): 32.4 mg via ORAL
  Filled 2023-12-29 (×7): qty 1

## 2023-12-29 MED ORDER — ALBUMIN HUMAN 5 % IV SOLN
INTRAVENOUS | Status: DC | PRN
Start: 2023-12-29 — End: 2023-12-29

## 2023-12-29 MED ORDER — ACETAMINOPHEN 500 MG PO TABS: 1000.0000 mg | ORAL_TABLET | Freq: Once | ORAL | Status: AC

## 2023-12-29 MED ORDER — LIDOCAINE 2% (20 MG/ML) 5 ML SYRINGE
INTRAMUSCULAR | Status: AC
  Filled 2023-12-29: qty 5

## 2023-12-29 MED ORDER — ALBUTEROL SULFATE HFA 108 (90 BASE) MCG/ACT IN AERS
INHALATION_SPRAY | RESPIRATORY_TRACT | Status: DC | PRN
  Administered 2023-12-29 (×2): 2 via RESPIRATORY_TRACT

## 2023-12-29 MED ORDER — BUPIVACAINE-EPINEPHRINE (PF) 0.5% -1:200000 IJ SOLN
INTRAMUSCULAR | Status: DC | PRN
  Administered 2023-12-29: 20 mL via PERINEURAL

## 2023-12-29 MED ORDER — LIDOCAINE 2% (20 MG/ML) 5 ML SYRINGE
INTRAMUSCULAR | Status: DC | PRN
  Administered 2023-12-29: 60 mg via INTRAVENOUS

## 2023-12-29 MED ORDER — FENTANYL CITRATE (PF) 250 MCG/5ML IJ SOLN
INTRAMUSCULAR | Status: DC | PRN
Start: 2023-12-29 — End: 2023-12-29
  Administered 2023-12-29 (×2): 25 ug via INTRAVENOUS
  Administered 2023-12-29: 50 ug via INTRAVENOUS

## 2023-12-29 MED ORDER — DEXAMETHASONE SODIUM PHOSPHATE 10 MG/ML IJ SOLN
INTRAMUSCULAR | Status: AC
  Filled 2023-12-29: qty 1

## 2023-12-29 MED ORDER — CEFAZOLIN SODIUM-DEXTROSE 2-4 GM/100ML-% IV SOLN
2.0000 g | INTRAVENOUS | Status: AC
  Administered 2023-12-29: 2 g via INTRAVENOUS

## 2023-12-29 MED ORDER — CHLORHEXIDINE GLUCONATE 0.12 % MT SOLN
15.0000 mL | Freq: Once | OROMUCOSAL | Status: AC
  Administered 2023-12-29: 15 mL via OROMUCOSAL

## 2023-12-29 MED ORDER — CHLORHEXIDINE GLUCONATE 0.12 % MT SOLN
OROMUCOSAL | Status: AC
  Filled 2023-12-29: qty 15

## 2023-12-29 MED ORDER — SUGAMMADEX SODIUM 200 MG/2ML IV SOLN
INTRAVENOUS | Status: DC | PRN
  Administered 2023-12-29: 200 mg via INTRAVENOUS

## 2023-12-29 MED ORDER — PHENYLEPHRINE HCL-NACL 20-0.9 MG/250ML-% IV SOLN
INTRAVENOUS | Status: DC | PRN
Start: 2023-12-29 — End: 2023-12-29
  Administered 2023-12-29: 45 ug/min via INTRAVENOUS
  Administered 2023-12-29: 25 ug/min via INTRAVENOUS

## 2023-12-29 MED ORDER — TRANEXAMIC ACID-NACL 1000-0.7 MG/100ML-% IV SOLN
INTRAVENOUS | Status: AC
Start: 2023-12-29 — End: 2023-12-29
  Filled 2023-12-29: qty 100

## 2023-12-29 MED ORDER — OXYCODONE HCL 5 MG PO TABS: 5.0000 mg | ORAL_TABLET | Freq: Once | ORAL | Status: DC | PRN

## 2023-12-29 MED ORDER — ONDANSETRON HCL 4 MG/2ML IJ SOLN
INTRAMUSCULAR | Status: AC
  Filled 2023-12-29: qty 2

## 2023-12-29 MED ORDER — PROPOFOL 10 MG/ML IV BOLUS
INTRAVENOUS | Status: DC | PRN
  Administered 2023-12-29: 100 mg via INTRAVENOUS

## 2023-12-29 MED ORDER — FENTANYL CITRATE (PF) 100 MCG/2ML IJ SOLN: 25.0000 ug | INTRAMUSCULAR | Status: DC | PRN

## 2023-12-29 MED ORDER — CEFAZOLIN SODIUM-DEXTROSE 2-4 GM/100ML-% IV SOLN
2.0000 g | Freq: Three times a day (TID) | INTRAVENOUS | Status: AC
  Administered 2023-12-29 (×2): 2 g via INTRAVENOUS
  Filled 2023-12-29 (×2): qty 100

## 2023-12-29 MED ORDER — ROCURONIUM BROMIDE 10 MG/ML (PF) SYRINGE
PREFILLED_SYRINGE | INTRAVENOUS | Status: DC | PRN
  Administered 2023-12-29: 30 mg via INTRAVENOUS
  Administered 2023-12-29: 20 mg via INTRAVENOUS
  Administered 2023-12-29: 50 mg via INTRAVENOUS

## 2023-12-29 MED ORDER — FENTANYL CITRATE (PF) 100 MCG/2ML IJ SOLN
INTRAMUSCULAR | Status: AC
  Filled 2023-12-29: qty 2

## 2023-12-29 MED ORDER — PROPOFOL 500 MG/50ML IV EMUL
INTRAVENOUS | Status: DC | PRN
  Administered 2023-12-29: 100 ug/kg/min via INTRAVENOUS

## 2023-12-29 MED ORDER — OXYCODONE HCL 5 MG/5ML PO SOLN: 5.0000 mg | Freq: Once | ORAL | Status: DC | PRN

## 2023-12-29 MED ORDER — PHENYLEPHRINE 80 MCG/ML (10ML) SYRINGE FOR IV PUSH (FOR BLOOD PRESSURE SUPPORT)
PREFILLED_SYRINGE | INTRAVENOUS | Status: AC
  Filled 2023-12-29: qty 10

## 2023-12-29 MED ORDER — EPHEDRINE SULFATE-NACL 50-0.9 MG/10ML-% IV SOSY
PREFILLED_SYRINGE | INTRAVENOUS | Status: DC | PRN
  Administered 2023-12-29: 5 mg via INTRAVENOUS

## 2023-12-29 MED ORDER — ROCURONIUM BROMIDE 10 MG/ML (PF) SYRINGE
PREFILLED_SYRINGE | INTRAVENOUS | Status: AC
Start: 2023-12-29 — End: ?
  Filled 2023-12-29: qty 10

## 2023-12-29 MED ORDER — ACETAMINOPHEN 500 MG PO TABS
ORAL_TABLET | ORAL | Status: AC
  Administered 2023-12-29: 1000 mg via ORAL
  Filled 2023-12-29: qty 2

## 2023-12-29 MED ORDER — 0.9 % SODIUM CHLORIDE (POUR BTL) OPTIME
TOPICAL | Status: DC | PRN
  Administered 2023-12-29: 1000 mL

## 2023-12-29 MED ORDER — OXYCODONE HCL 5 MG PO TABS
5.0000 mg | ORAL_TABLET | Freq: Four times a day (QID) | ORAL | Status: DC | PRN
  Administered 2023-12-29 – 2023-12-31 (×4): 5 mg via ORAL
  Filled 2023-12-29 (×4): qty 1

## 2023-12-29 SURGICAL SUPPLY — 57 items
BAG COUNTER SPONGE SURGICOUNT (BAG) ×2 IMPLANT
BIT DRILL 3.8X270 (BIT) IMPLANT
BIT DRILL CAL 3.2 LONG (BIT) IMPLANT
BIT DRILL SHORT 3.2MM (DRILL) IMPLANT
BNDG COHESIVE 4X5 TAN STRL LF (GAUZE/BANDAGES/DRESSINGS) ×2 IMPLANT
BNDG ELASTIC 4X5.8 VLCR STR LF (GAUZE/BANDAGES/DRESSINGS) ×2 IMPLANT
BNDG ELASTIC 6INX 5YD STR LF (GAUZE/BANDAGES/DRESSINGS) ×2 IMPLANT
BRUSH SCRUB EZ PLAIN DRY (MISCELLANEOUS) ×4 IMPLANT
CHLORAPREP W/TINT 26 (MISCELLANEOUS) ×2 IMPLANT
COVER SURGICAL LIGHT HANDLE (MISCELLANEOUS) ×4 IMPLANT
DERMABOND ADVANCED .7 DNX12 (GAUZE/BANDAGES/DRESSINGS) ×4 IMPLANT
DRAPE C-ARM 42X72 X-RAY (DRAPES) ×2 IMPLANT
DRAPE INCISE IOBAN 66X45 STRL (DRAPES) ×2 IMPLANT
DRAPE SURG 17X23 STRL (DRAPES) ×2 IMPLANT
DRAPE SURG ORHT 6 SPLT 77X108 (DRAPES) ×4 IMPLANT
DRAPE U-SHAPE 47X51 STRL (DRAPES) ×4 IMPLANT
DRSG AQUACEL AG ADV 3.5X 4 (GAUZE/BANDAGES/DRESSINGS) IMPLANT
DRSG AQUACEL AG ADV 3.5X 6 (GAUZE/BANDAGES/DRESSINGS) IMPLANT
DRSG MEPILEX POST OP 4X8 (GAUZE/BANDAGES/DRESSINGS) ×2 IMPLANT
ELECTRODE REM PT RTRN 9FT ADLT (ELECTROSURGICAL) ×2 IMPLANT
EVACUATOR 1/8 PVC DRAIN (DRAIN) IMPLANT
GAUZE PAD ABD 8X10 STRL (GAUZE/BANDAGES/DRESSINGS) ×2 IMPLANT
GLOVE BIO SURGEON STRL SZ 6.5 (GLOVE) ×6 IMPLANT
GLOVE BIO SURGEON STRL SZ7.5 (GLOVE) ×8 IMPLANT
GLOVE BIOGEL PI IND STRL 6.5 (GLOVE) ×2 IMPLANT
GLOVE BIOGEL PI IND STRL 7.5 (GLOVE) ×2 IMPLANT
GOWN STRL REUS W/ TWL LRG LVL3 (GOWN DISPOSABLE) ×4 IMPLANT
GUIDEROD SS 2.5MMX280MM (ROD) IMPLANT
KIT BASIN OR (CUSTOM PROCEDURE TRAY) ×2 IMPLANT
KIT TURNOVER KIT B (KITS) ×2 IMPLANT
KWIRE TROCAR POINT 2.5X280 (WIRE) IMPLANT
MANIFOLD NEPTUNE II (INSTRUMENTS) ×2 IMPLANT
NAIL HUM LONG STRT 8.5X255 RT (Nail) IMPLANT
NDL HYPO 25X1 1.5 SAFETY (NEEDLE) ×2 IMPLANT
NEEDLE HYPO 25X1 1.5 SAFETY (NEEDLE) ×1 IMPLANT
NS IRRIG 1000ML POUR BTL (IV SOLUTION) ×2 IMPLANT
PACK ORTHO EXTREMITY (CUSTOM PROCEDURE TRAY) ×2 IMPLANT
PAD ARMBOARD POSITIONER FOAM (MISCELLANEOUS) ×4 IMPLANT
ROD REAMING 2.5 (INSTRUMENTS) IMPLANT
SCREW LOCK 4.5X36 TI FT (Screw) IMPLANT
SCREW LOCK 4X26 TI (Screw) IMPLANT
SCREW TI MULTILOC 4.5X42 (Screw) IMPLANT
SPONGE T-LAP 18X18 ~~LOC~~+RFID (SPONGE) ×2 IMPLANT
STAPLER VISISTAT 35W (STAPLE) ×2 IMPLANT
SUCTION TUBE FRAZIER 10FR DISP (SUCTIONS) ×2 IMPLANT
SUT ETHILON 3 0 PS 1 (SUTURE) ×4 IMPLANT
SUT MNCRL AB 3-0 PS2 18 (SUTURE) ×2 IMPLANT
SUT PROLENE 0 CT (SUTURE) IMPLANT
SUT VIC AB 0 CT1 27XBRD ANBCTR (SUTURE) ×4 IMPLANT
SUT VIC AB 2-0 CT1 TAPERPNT 27 (SUTURE) ×4 IMPLANT
SYR CONTROL 10ML LL (SYRINGE) ×2 IMPLANT
TOWEL GREEN STERILE (TOWEL DISPOSABLE) ×6 IMPLANT
TOWEL GREEN STERILE FF (TOWEL DISPOSABLE) ×2 IMPLANT
TRAY FOLEY MTR SLVR 16FR STAT (SET/KITS/TRAYS/PACK) IMPLANT
TUBE CONNECTING 12X1/4 (SUCTIONS) ×2 IMPLANT
WATER STERILE IRR 1000ML POUR (IV SOLUTION) ×2 IMPLANT
YANKAUER SUCT BULB TIP NO VENT (SUCTIONS) IMPLANT

## 2023-12-29 NOTE — Progress Notes (Addendum)
 4540 Kept NPO since midnight for OR to repair Rt Humerus Fracture. CHG bath & oral care performed, Dentures removed & at pt's bedside. Pt was also Confused, as he pulled off his tele electrodes several times.

## 2023-12-29 NOTE — Progress Notes (Signed)
 History and exam reviewed. No changes today from previous two notes. Plan to proceed with right humerus open reduction internal fixation.

## 2023-12-29 NOTE — Progress Notes (Signed)
 Orthopaedic Surgery Progress Note  Comfortable in PACU, no acute events.  Exam: Right upper extremity: Dressings are clean dry and intact Neuroexam is limited due to block Palpable radial pulse  Assessment: 68 y.o. male * Day of Surgery * status post open reduction internal fixation of right humeral shaft fracture with intramedullary nail  Plan: Nonweightbearing right upper extremity Sling is optional Okay to use right upper extremity for activities of daily living that are nonweightbearing Keep dressings clean and dry, okay to change with regular dry gauze if needed OK to resume DVT chemoppx Perioperative ancef  for 24 PT beginning on POD#1 Discharge instructions placed in Epic

## 2023-12-29 NOTE — Transfer of Care (Signed)
 Immediate Anesthesia Transfer of Care Note  Patient: Seth [name]  Procedure(s) Performed: OPEN REDUCTION INTERNAL FIXATION (ORIF) HUMERAL SHAFT FRACTURE (Right)  Patient Location: PACU  Anesthesia Type:General and Regional  Level of Consciousness: awake, drowsy, and patient cooperative  Airway & Oxygen Therapy: Patient Spontanous Breathing and Patient connected to face mask oxygen  Post-op Assessment: Report given to RN and Post -op Vital signs reviewed and stable  Post vital signs: Reviewed and stable  Last Vitals:  Vitals Value Taken Time  BP 112/69 12/29/23 1037  Temp 36.4 C 12/29/23 1037  Pulse 82 12/29/23 1037  Resp 19 12/29/23 1038  SpO2 99 % 12/29/23 1037  Vitals shown include unfiled device data.  Last Pain:  Vitals:   12/29/23 1037  TempSrc:   PainSc: 0-No pain         Complications: No notable events documented.

## 2023-12-29 NOTE — TOC CAGE-AID Note (Signed)
 Transition of Care [facility]) - CAGE-AID Screening   Patient Details  Name: Seth [name] MRN: [id number] Date of Birth: December 06, 1955  [facility]) CM/SW Contact:    Jannice Mends, LCSW Phone Number: 12/29/2023, 10:13 AM   Clinical Narrative: Patient disoriented and unable to participate in screening at this time.    CAGE-AID Screening: Substance Abuse Screening unable to be completed due to: : Patient unable to participate

## 2023-12-29 NOTE — Anesthesia Postprocedure Evaluation (Signed)
 Anesthesia Post Note  Patient: KINTE GRIEVES  Procedure(s) Performed: OPEN REDUCTION INTERNAL FIXATION (ORIF) HUMERAL SHAFT FRACTURE (Right)     Patient location during evaluation: PACU Anesthesia Type: General Level of consciousness: awake and alert Pain management: pain level controlled Vital Signs Assessment: post-procedure vital signs reviewed and stable Respiratory status: spontaneous breathing, nonlabored ventilation and respiratory function stable Cardiovascular status: stable and blood pressure returned to baseline Postop Assessment: no apparent nausea or vomiting Anesthetic complications: no   No notable events documented.  Last Vitals:  Vitals:   12/29/23 1037 12/29/23 1204  BP: 112/69 112/74  Pulse: 82 79  Resp: 19 17  Temp: 36.4 C 36.5 C  SpO2: 99% 97%    Last Pain:  Vitals:   12/29/23 1204  TempSrc: Oral  PainSc:                  Juventino Oppenheim

## 2023-12-29 NOTE — Anesthesia Procedure Notes (Signed)
 Anesthesia Regional Block: Interscalene brachial plexus block   Pre-Anesthetic Checklist: , timeout performed,  Correct Patient, Correct Site, Correct Laterality,  Correct Procedure, Correct Position, site marked,  Risks and benefits discussed,  Surgical consent,  Pre-op evaluation,  At surgeon's request and post-op pain management  Laterality: Right  Prep: chloraprep       Needles:  Injection technique: Single-shot  Needle Type: Echogenic Needle     Needle Length: 5cm  Needle Gauge: 21     Additional Needles:   Narrative:  Start time: 12/29/2023 7:42 AM End time: 12/29/2023 7:45 AM Injection made incrementally with aspirations every 5 mL.  Performed by: Personally  Anesthesiologist: Juventino Oppenheim, MD  Additional Notes: No pain on injection. No increased resistance to injection. Injection made in 5cc increments. Good needle visualization. Patient tolerated the procedure well.

## 2023-12-29 NOTE — Progress Notes (Signed)
 HD#1 Subjective:  Overnight Events: No events overnight.  Examined at the bedside, s/p right humerus ORIF.  Endorses adequate pain control. Objective:  Vital signs in last 24 hours: Vitals:   12/29/23 0006 12/29/23 0510 12/29/23 0644 12/29/23 0658  BP: 126/70 (!) 155/92    Pulse: 86 86    Resp: 18 18    Temp: 99.8 F (37.7 C) 98.8 F (37.1 C)    TempSrc: Oral Oral    SpO2: 100% 99%    Weight:   (P) 75.6 kg 75.6 kg  Height:   (P) 6\' 3"  (1.905 m) 6\' 3"  (1.905 m)   Supplemental O2: Room Air SpO2: 99 %   Physical Exam:   General: Sitting in bed, NAD.   CV: RRR. No m/r/g. No LE edema Pulmonary: Lungs CTAB. Normal effort. No wheezing or rales. Neuro: Oriented to person, but not place or time. No focal weakness.  Speech sometimes hard to understand.  Filed Weights   12/27/23 1400 12/29/23 0644 12/29/23 0658  Weight: 75.6 kg (P) 75.6 kg 75.6 kg     Intake/Output Summary (Last 24 hours) at 12/29/2023 0904 Last data filed at 12/29/2023 0855 Gross per 24 hour  Intake 540 ml  Output --  Net 540 ml     Recent Labs    12/27/23 1406  GLUCAP 77     Pertinent Labs:    Latest Ref Rng & Units 12/28/2023    1:12 PM 12/27/2023    3:15 PM 12/27/2023    2:10 PM  CBC  WBC 4.0 - 10.5 K/uL 6.8  10.9    Hemoglobin 13.0 - 17.0 g/dL 53.6  64.4  03.4   Hematocrit 39.0 - 52.0 % 41.7  45.9  47.0   Platelets 150 - 400 K/uL 191  224         Latest Ref Rng & Units 12/28/2023    7:09 AM 12/27/2023    3:15 PM 12/27/2023    2:10 PM  CMP  Glucose 70 - 99 mg/dL 742  85  76   BUN 8 - 23 mg/dL 13  9  12    Creatinine 0.61 - 1.24 mg/dL 5.95  6.38  7.56   Sodium 135 - 145 mmol/L 136  138  137   Potassium 3.5 - 5.1 mmol/L 4.2  3.9  4.2   Chloride 98 - 111 mmol/L 104  107  108   CO2 22 - 32 mmol/L 23  23    Calcium  8.9 - 10.3 mg/dL 8.4  9.1    Total Protein 6.5 - 8.1 g/dL  6.9    Total Bilirubin 0.0 - 1.2 mg/dL  0.7    Alkaline Phos 38 - 126 U/L  144    AST 15 - 41 U/L  19    ALT 0 - 44 U/L   13      Imaging: No results found.   Assessment/Plan:   Principal Problem:   Altered mental status Active Problems:   Closed displaced comminuted fracture of shaft of right humerus   Cerebrovascular accident (CVA) (HCC)   Patient Summary: Seth [name] is a 68 year old male with a history of hypertension, COPD, BPH, depression, and seizures presenting after a fall and altered mental status.  #Altered mental status #History of seizures Mental status has improved.  His serum phenobarbital  level is at the high end of normal, this could be contributing to sedation as noted by patient's daughter. I discussed with neurology, agreeable to decreasing phenobarbital  to half  the dose.  Will still need to be on Keppra  and Vimpat , due to his history of seizures. EEG  negative for any ongoing seizures. - Neurology following, appreciate recs. - Cw Keppra  2000 mg, Vimpat  100 mg.   #Fracture of proximal right humerus Status post right humeral ORIF with Ortho today.  Resumed DVT ppx.   -Ortho following, appreciate recs. - Restart diet - Nonweightbearing right upper extremity, sling - Multimodal pain control, Tylenol  and Oxy 5 mg PRN.   #Bilateral cerebral infarcts #HLD  Prior history of right-sided weakness.  CT of the head, and MRI of the brain negative for new stroke. - Continue aspirin  81 mg daily and Lipitor 40 mg daily.    Diet: N.p.o., restart diet after surgery. IVF: PO intake VTE: enoxaparin  (LOVENOX ) injection 40 mg Start: 12/28/23 1000 Code: Full PT/OT: Pending ID:  Anti-infectives (From admission, onward)    Start     Dose/Rate Route Frequency Ordered Stop   12/29/23 0730  ceFAZolin  (ANCEF ) IVPB 2g/100 mL premix        2 g 200 mL/hr over 30 Minutes Intravenous On call to O.R. 12/29/23 1610 12/29/23 0842   12/29/23 9604  ceFAZolin  (ANCEF ) 2-4 GM/100ML-% IVPB       Note to Pharmacy: Chaneta Comer: cabinet override      12/29/23 5409 12/29/23 8119       Anticipated  discharge to home pending medical stabilization. Marni Sins, MD 12/29/2023, 9:04 AM Pager: [phone] [facility]

## 2023-12-29 NOTE — Op Note (Signed)
 Operative Note  Seth [name]  Surgery Date: 12/29/2023 Surgeon: Marilee Showers, MD Assistant(s): None   Preop Diagnosis(es):  Right humeral shaft fracture Altered mental status  Postop Diagnosis(es):  Right humeral shaft fracture Altered mental status  Operative Procedure(s): Open reduction internal fixation of right humeral shaft fracture with intramedullary nail   Anesthesia: The patient had administration of general anesthesia. Further details can be found in the anesthesia record.  Estimated Blood Loss: 100 mL  Complications:  None noted intraoperatively  Drains: None  Implants:  Synthes 255 by 8.5 humeral nail. Full detailed list below.  Indications:  Seth [name] is a 68 y.o. year old male who presented to the emergency department earlier this week due to altered mental status also found to have a right proximal humerus shaft fracture.  I discussed treatment options with his daughters including nonoperative, open reduction internal fixation using plate and screws or intramedullary implant.  Given his lack of compliance due to mental status as well as reliance on a [place], they are in favor of early as weightbearing strategy possible.  As a result we specifically discussed intramedullary fixation.  We discussed associated risks including nonunion, malunion, hardware irritation or failure, infection, neurovascular injury among others..  After thorough discussion of the risks and benefits of surgical management and alternative nonoperative treatment options, they elected to proceed with surgical treatment. Risks and complications were discussed and understood including, but not limited to, bleeding, infection, stiffness, numbness, damage to surrounding structures (including blood vessels and nerves), failure of the procedure, need for secondary or revision procedures, failure of healing, incomplete functional recovery, and worsening or chronic pain. Additional risks pertinent  to the surgery and anesthesia also include pulmonary compromise, blood clots/pulmonary embolism, cardiac complications, and death. No guarantees were stated or implied. All questions were answered to the best of my ability and the patient verbalized understanding.    Description of Procedure:  The patient was identified in the holding area, taken to the operating room and underwent successful induction of anesthesia. They were then placed on the operating table in a supine position, with all bony prominences well padded. Preprocedure antibiotics were administered. A time out was performed and all parties were in agreement with the patient identification, surgical site and planned procedure. The extremity was then prepped and draped in usual sterile fashion. A second time out was performed prior to incision, once again confirming the patient, site, procedure and expectations of surgery.  After positioning the patient's supine on the Epps table with the arm in the spider positioning holder, C arm was brought into visualize the fracture.  Preliminary reduction was achieved using manipulation of the pneumatic arm holder and a bump under the axilla.  A starting point for the nail was then localized using a spinal needle just anterior to the acromioclavicular joint.  An incision was then made in a longitudinal fashion just anterior to the [facility] joint and a guidewire was introduced.  We confirmed center center placement in the humeral head using AP and lateral Y views.  The guidewire was then introduced to the humeral head and the opening reamer was taken down to the appropriate level.  At this point we advanced our ball-tipped guidewire through the opening reamer site and across the fracture into the intramedullary canal.  Intraosseous placement of the guidewire was confirmed with biplanar fluoroscopy and then we advanced the wire to within 2 cm of the olecranon fossa.  We then measured for our nail. Sequential  reamers were used up to a size 10 mm reamer which had adequate diaphyseal chatter.  A size 8.5 nail was then opened and assembled on the back table. The nail was placed and observed under fluoroscopy.  Proximal cross lock screws were then placed through the guide.  Distal cross lock screws were placed using a perfect circle technique as well as a nick and spread incision to safely drill and placed the screws through the anterior soft tissues.  At this point we verified all of our screw lengths and positioning.  This was done on the live fluoroscopy with both internal and external rotation for the humeral head to ensure no screws were close to the articular surface.  We also verified the positioning of the distal interlocking screws with biplanar fluoroscopy.  The proximal targeting jig was then removed and this concluded the procedure.  We irrigated all incisions thoroughly with normal saline.  The superior muscular portion of the supraspinatus was closed using 0 Vicryl suture.  All skin incisions were closed using 3-0 nylon.  Sterile dressings were placed.   All counts at the end of the case were correct.  Post-Operative Condition: The patient was transferred to the PACU in stable condition.  Post-Operative Plan: They will be nonweightbearing in a sling at all times.  They will follow our humeral nail rehabilitation protocol.   We will see them back in 10 days for a wound check and for further review of surgical findings.  Implant Record:  Implant Name Type Inv. Item Serial No. Manufacturer Lot No. LRB No. Used Action  NAIL HUM LONG STRT 8.5X255 RT - [id number] Nail NAIL HUM LONG STRT 8.5X255 RT  [employer] [id number] Right 1 Implanted  SCREW LOCK 4.5X36 TI FT - [id number] Screw SCREW LOCK 4.5X36 TI FT  [employer]  Right 1 Implanted  SCREW TI MULTILOC 4.5X42 - [id number] Screw SCREW TI MULTILOC 4.5X42  [employer]  Right 2 Implanted

## 2023-12-29 NOTE — Brief Op Note (Signed)
 12/29/2023  9:58 AM  PATIENT:  Seth [name]  68 y.o. male  PRE-OPERATIVE DIAGNOSIS:  right humerus fracture  POST-OPERATIVE DIAGNOSIS:  right humerus fracture  PROCEDURE:  Procedure(s): OPEN REDUCTION INTERNAL FIXATION (ORIF) HUMERAL SHAFT FRACTURE (Right)  SURGEON:  Surgeons and Role:    Marilee Showers, MD - Primary  PHYSICIAN ASSISTANT:   ASSISTANTS: none   ANESTHESIA:   general  EBL:  100 mL   BLOOD ADMINISTERED:none  DRAINS: none   LOCAL MEDICATIONS USED:  NONE  SPECIMEN:  No Specimen  DISPOSITION OF SPECIMEN:  N/A  COUNTS:  YES  TOURNIQUET:  * No tourniquets in log *  DICTATION: .Dragon Dictation  PLAN OF CARE: Admit to inpatient   PATIENT DISPOSITION:  PACU - hemodynamically stable.   Delay start of Pharmacological VTE agent (>24hrs) due to surgical blood loss or risk of bleeding: no

## 2023-12-29 NOTE — Discharge Instructions (Signed)
 Orthopaedic Surgery Discharge Instructions  Surgery: Right humerus fracture open reduction internal fixation  Weight bearing: Nonweightbearing right upper extremity.  Sling is optional.  Okay to use right upper extremity for daily activities that are nonweightbearing  Dressings/Incisions: Keep clean and dry at all times. If dressings become wet or soiled, they should be changed with regular dry gauze or a clean bandage. It is OK to shower, gently pat incision or dressing dry afterwards. Leave steri-strips in place until they fall off on their own. DO NOT put anything on your incisions (cream, ointment, lotion, etc).   Follow up appointment: You are scheduled to follow up with Dr. Maryjane Snider. If you do not know when your follow up appointment is, please call [phone] to schedule your appointment. Our office is located at [address] [place], [place], [address].  To reach our office with any questions or concerns please call [phone].

## 2023-12-29 NOTE — Plan of Care (Signed)

## 2023-12-29 NOTE — Anesthesia Procedure Notes (Signed)
 Procedure Name: Intubation Date/Time: 12/29/2023 8:06 AM  Performed by: Marcelle Sergeant, CRNAPre-anesthesia Checklist: Patient identified, Emergency Drugs available, Suction available and Patient being monitored Patient Re-evaluated:Patient Re-evaluated prior to induction Oxygen Delivery Method: Circle system utilized Preoxygenation: Pre-oxygenation with 100% oxygen Induction Type: IV induction Ventilation: Mask ventilation without difficulty Laryngoscope Size: Glidescope and 4 Grade View: Grade I Tube type: Oral Tube size: 7.5 mm Number of attempts: 1 Airway Equipment and Method: Stylet and Video-laryngoscopy Placement Confirmation: ETT inserted through vocal cords under direct vision, positive ETCO2 and breath sounds checked- equal and bilateral Secured at: 23 cm Tube secured with: Tape Dental Injury: Teeth and Oropharynx as per pre-operative assessment

## 2023-12-30 DIAGNOSIS — S42351A Displaced comminuted fracture of shaft of humerus, right arm, initial encounter for closed fracture: Secondary | ICD-10-CM | POA: Diagnosis not present

## 2023-12-30 LAB — BASIC METABOLIC PANEL WITH GFR
Anion gap: 12 (ref 5–15)
BUN: 10 mg/dL (ref 8–23)
CO2: 20 mmol/L — ABNORMAL LOW (ref 22–32)
Calcium: 8.2 mg/dL — ABNORMAL LOW (ref 8.9–10.3)
Chloride: 102 mmol/L (ref 98–111)
Creatinine, Ser: 1.01 mg/dL (ref 0.61–1.24)
GFR, Estimated: >60 mL/min (ref >60)
Glucose, Bld: 98 mg/dL (ref 70–99)
Potassium: 4.3 mmol/L (ref 3.5–5.1)
Sodium: 134 mmol/L — ABNORMAL LOW (ref 135–145)

## 2023-12-30 LAB — CBC
HCT: 33.1 % — ABNORMAL LOW (ref 39.0–52.0)
Hemoglobin: 10.4 g/dL — ABNORMAL LOW (ref 13.0–17.0)
MCH: 28 pg (ref 26.0–34.0)
MCHC: 31.4 g/dL (ref 30.0–36.0)
MCV: 89 fL (ref 80.0–100.0)
Platelets: 149 10*3/uL — ABNORMAL LOW (ref 150–400)
RBC: 3.72 MIL/uL — ABNORMAL LOW (ref 4.22–5.81)
RDW: 15.7 % — ABNORMAL HIGH (ref 11.5–15.5)
WBC: 11.6 10*3/uL — ABNORMAL HIGH (ref 4.0–10.5)
nRBC: 0 % (ref 0.0–0.2)

## 2023-12-30 MED ORDER — MELATONIN 5 MG PO TABS
5.0000 mg | ORAL_TABLET | Freq: Every evening | ORAL | Status: DC | PRN
Start: 2023-12-30 — End: 2024-01-02
  Administered 2023-12-30: 5 mg via ORAL
  Filled 2023-12-30: qty 1

## 2023-12-30 MED ORDER — ONDANSETRON HCL 4 MG/2ML IJ SOLN
4.0000 mg | Freq: Three times a day (TID) | INTRAMUSCULAR | Status: AC | PRN
  Administered 2023-12-31: 4 mg via INTRAVENOUS
  Filled 2023-12-30: qty 2

## 2023-12-30 NOTE — Progress Notes (Signed)
 HD#3 Subjective:  Overnight Events: Fever to 101.5, got 1 dose of Tylenol .  Fever curve improved.  Exam at bedside, reports a nonrestful sleep. He is endorsing nausea this morning, denies new cough, dysuria or urinary retention. He endorses adequate pain control.  Objective:  Vital signs in last 24 hours: Vitals:   12/29/23 2159 12/29/23 2302 12/30/23 0526 12/30/23 0750  BP: (!) 148/79 (!) 147/83 138/79 139/77  Pulse: (!) 108 (!) 103 95 98  Resp:   16 18  Temp: 100.2 F (37.9 C) 99 F (37.2 C) 99.3 F (37.4 C) 100.3 F (37.9 C)  TempSrc: Oral Oral Oral Oral  SpO2: 95% 97% 100% 99%  Weight:      Height:       Supplemental O2: Room Air SpO2: 99 %  Physical Exam:   General: Sitting in bed, right arm in a sling. CV: RRR. No m/r/g. No LE edema Pulmonary: Lungs CTAB. Normal effort. No wheezing or rales. Neuro: Oriented to person and place, but not time.  No new weakness.  Filed Weights   12/27/23 1400 12/29/23 0644 12/29/23 0658  Weight: 75.6 kg (P) 75.6 kg 75.6 kg     Intake/Output Summary (Last 24 hours) at 12/30/2023 1110 Last data filed at 12/30/2023 0752 Gross per 24 hour  Intake 960 ml  Output --  Net 960 ml     Recent Labs    12/27/23 1406  GLUCAP 77     Pertinent Labs:    Latest Ref Rng & Units 12/29/2023   12:41 PM 12/28/2023    1:12 PM 12/27/2023    3:15 PM  CBC  WBC 4.0 - 10.5 K/uL 11.0  6.8  10.9   Hemoglobin 13.0 - 17.0 g/dL 51.8  84.1  66.0   Hematocrit 39.0 - 52.0 % 34.1  41.7  45.9   Platelets 150 - 400 K/uL 154  191  224        Latest Ref Rng & Units 12/29/2023   12:41 PM 12/28/2023    7:09 AM 12/27/2023    3:15 PM  CMP  Glucose 70 - 99 mg/dL 630  160  85   BUN 8 - 23 mg/dL 13  13  9    Creatinine 0.61 - 1.24 mg/dL 1.09  3.23  5.57   Sodium 135 - 145 mmol/L 138  136  138   Potassium 3.5 - 5.1 mmol/L 4.1  4.2  3.9   Chloride 98 - 111 mmol/L 105  104  107   CO2 22 - 32 mmol/L 22  23  23    Calcium  8.9 - 10.3 mg/dL 8.5  8.4  9.1   Total  Protein 6.5 - 8.1 g/dL   6.9   Total Bilirubin 0.0 - 1.2 mg/dL   0.7   Alkaline Phos 38 - 126 U/L   144   AST 15 - 41 U/L   19   ALT 0 - 44 U/L   13     Imaging: No results found.   Assessment/Plan:   Principal Problem:   Altered mental status Active Problems:   Closed displaced comminuted fracture of shaft of right humerus   Cerebrovascular accident (CVA) (HCC)   Patient Summary: Seth [name] is a 68 year old male with a history of hypertension, COPD, BPH, depression, and seizures presenting after a fall and altered mental status.  #Altered mental status #History of seizures Mental status has improved.  Levetiracetam  level elevated but improved from what it was a month ago( 153>>119).  I question if patient is taking levetiracetam  as prescribed.  Elevated serum levetiracetam  could also be in the setting of renal dysfunction, but this is less likely as he has normal kidney function.  I will reach out to patient neurologist about this.    - Cw Keppra  2000 mg BID - Vimpat  100 mg BID - Phenobarbital  32.4 mg BID.   #Fracture of proximal right humerus Postop day 1, had a fever to 101.5.  He endorses nausea, but no vomiting.  Suspect fever is likely secondary to postinflammatory response postsurgery.  I also considered surgical site infection, but that is less likely as the right arm is without redness, no white count and HDS.  Nausea likely secondary to anesthesia during surgery, will schedule as needed Zofran .  - Ortho following, appreciate recs. -PT/OT eval, and subsequent dispo planning. - Nonweightbearing right upper extremity, sling - Multimodal pain control, Tylenol  and Oxy 5 mg PRN.  - PRN Zofran .   #Bilateral cerebral infarcts #HLD  Prior history of right-sided weakness.  CT of the head, and MRI of the brain negative for new stroke. - Continue aspirin  81 mg daily and Lipitor 40 mg daily.    Diet: N.p.o., restart diet after surgery. IVF: PO intake VTE: enoxaparin   (LOVENOX ) injection 40 mg Start: 12/28/23 1000 Code: Full PT/OT: Pending ID:  Anti-infectives (From admission, onward)    Start     Dose/Rate Route Frequency Ordered Stop   12/29/23 1400  ceFAZolin  (ANCEF ) IVPB 2g/100 mL premix        2 g 200 mL/hr over 30 Minutes Intravenous Every 8 hours 12/29/23 1046 12/29/23 2203   12/29/23 0730  ceFAZolin  (ANCEF ) IVPB 2g/100 mL premix        2 g 200 mL/hr over 30 Minutes Intravenous On call to O.R. 12/29/23 8295 12/29/23 0842   12/29/23 6213  ceFAZolin  (ANCEF ) 2-4 GM/100ML-% IVPB       Note to Pharmacy: Chaneta Comer: cabinet override      12/29/23 0865 12/29/23 7846       Anticipated discharge to home pending medical stabilization. Marni Sins, MD 12/30/2023, 11:10 AM Pager: [phone] [facility]

## 2023-12-30 NOTE — Plan of Care (Signed)

## 2023-12-30 NOTE — Progress Notes (Signed)
 Orthopaedic Surgery Progress Note  Doing well today. Conversational but limited insight as baseline  Exam: Right upper extremity: Dressings are clean dry and intact Neuroexam is limited due to block Palpable radial pulse  Assessment: 68 y.o. male 1 Day Post-Op status post open reduction internal fixation of right humeral shaft fracture with intramedullary nail  Plan: Nonweightbearing right upper extremity Sling is optional Okay to use right upper extremity for activities of daily living that are nonweightbearing Keep dressings clean and dry, okay to change with regular dry gauze if needed OK to resume DVT chemoppx - currently lov Perioperative ancef  for 24 PT beginning on POD#1 Discharge instructions placed in Epic

## 2023-12-30 NOTE — Progress Notes (Signed)
 Physical Therapy Re-Evaluation Patient Details Name: Seth [name] MRN: [id number] DOB: 1955-09-14 Today's Date: 12/30/2023   History of Present Illness 68 y.o. male with right humeral shaft fracture post fall at home.  Nonweightbearing right upper extremity, sling with planned for surgery tomorrow, 5/3.  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    PT Comments  Pt is continuing to progress towards goals. Goals assessed without updates today. Pt currently is Min A for bed mobility, Min to Mod A for sit to stand and step pivot transfers with wide based quad cane. Pt has difficulty with safety and remembering Wb precautions for RUE. Pt sling was adjusted upon entering room. Due to pt current functional status, home set up and available assistance at home recommending skilled physical therapy services < 3 hours/day in order to address strength, balance and functional mobility to decrease risk for falls, injury, immobility, skin break down and re-hospitalization.      If plan is discharge home, recommend the following: A little help with walking and/or transfers;Assistance with cooking/housework;Assist for transportation;Help with stairs or ramp for entrance;Supervision due to cognitive status   Can travel by private vehicle     Yes  Equipment Recommendations  Cane;Other (comment);BSC/3in1 (large base quad cane)       Precautions / Restrictions Precautions Precautions: Fall Recall of Precautions/Restrictions: Impaired Required Braces or Orthoses: Sling Restrictions Weight Bearing Restrictions Per Provider Order: Yes RUE Weight Bearing Per Provider Order: Non weight bearing     Mobility  Bed Mobility Overal bed mobility: Needs Assistance Bed Mobility: Supine to Sit     Supine to sit: HOB elevated, Min assist     General bed mobility comments: Min A at trunk to get to sitting with sequencing for getting to EOB.     Transfers Overall transfer level: Needs assistance Equipment used: Quad cane Transfers: Sit to/from Stand, Bed to chair/wheelchair/BSC Sit to Stand: Min assist   Step pivot transfers: Mod assist, Min assist       General transfer comment: pt performed sit to stand 2x from EOB initially pt was Min A for sit to stand 2x. Attempted to step toward chair at Mod A. Pt sat back down given demonstration with quad cane then performed step pivot at Min A with verbal cues/sequencing    Ambulation/Gait     General Gait Details: did not progress due to fatigue and balance this session     Balance Overall balance assessment: Needs assistance Sitting-balance support: Feet supported, Single extremity supported Sitting balance-[name] Scale: Fair Sitting balance - Comments: on EOB unsupported   Standing balance support: Single extremity supported, Reliant on assistive device for balance Standing balance-[name] Scale: Poor Standing balance comment: reliant on external support and AD.        Communication Communication Communication: Impaired Factors Affecting Communication: Reduced clarity of speech  Cognition Arousal: Alert Behavior During Therapy: WFL for tasks assessed/performed   PT - Cognitive impairments: No family/caregiver present to determine baseline, Orientation, Initiation, Problem solving, Attention, Safety/Judgement   Orientation impairments: Time       Following commands: Intact Following commands impaired: Only follows one step commands consistently, Follows one step commands with increased time    Cueing Cueing Techniques: Verbal cues, Tactile cues         Pertinent Vitals/Pain Pain Assessment Pain Assessment: Faces Faces Pain Scale: Hurts little more Breathing: normal Negative Vocalization: none Facial Expression: smiling or inexpressive Body Language: relaxed Consolability: no  need to console PAINAD Score: 0 Pain Location: R shoulder with mobility Pain  Descriptors / Indicators: Grimacing Pain Intervention(s): Monitored during session, Limited activity within patient's tolerance, Repositioned     PT Goals (current goals can now be found in the care plan section) Acute Rehab PT Goals Patient Stated Goal: to walk PT Goal Formulation: With patient Time For Goal Achievement: 01/11/24 Potential to Achieve Goals: Fair Progress towards PT goals: Progressing toward goals    Frequency    Min 2X/week      PT Plan  Continue with current POC        AM-PAC PT "6 Clicks" Mobility   Outcome Measure    Help needed moving from lying on your back to sitting on the side of a flat bed without using bedrails?: A Little Help needed moving to and from a bed to a chair (including a wheelchair)?: A Lot Help needed standing up from a chair using your arms (e.g., wheelchair or bedside chair)?: A Little Help needed to walk in hospital room?: Total Help needed climbing 3-5 steps with a railing? : Total 6 Click Score: 10    End of Session Equipment Utilized During Treatment: Gait belt;Other (comment) (shoulder sling) Activity Tolerance: Patient limited by fatigue;Patient tolerated treatment well Patient left: in chair;with call bell/phone within reach;with chair alarm set Nurse Communication: Mobility status PT Visit Diagnosis: Other abnormalities of gait and mobility (R26.89);Muscle weakness (generalized) (M62.81);History of falling (Z91.81)     Time: 4098-1191 PT Time Calculation (min) (ACUTE ONLY): 26 min  Charges:    $Therapeutic Activity: 8-22 mins PT General Charges $$ ACUTE PT VISIT: 1 Visit                    Sloan Duncans, DPT, CLT  Acute Rehabilitation Services Office: [phone] (Secure chat preferred)    Jenice Mitts 12/30/2023, 4:40 PM

## 2023-12-31 ENCOUNTER — Other Ambulatory Visit: Payer: Self-pay

## 2023-12-31 ENCOUNTER — Ambulatory Visit: Payer: Medicare HMO | Admitting: Neurology

## 2023-12-31 DIAGNOSIS — S42351A Displaced comminuted fracture of shaft of humerus, right arm, initial encounter for closed fracture: Secondary | ICD-10-CM | POA: Diagnosis not present

## 2023-12-31 DIAGNOSIS — R569 Unspecified convulsions: Secondary | ICD-10-CM | POA: Diagnosis not present

## 2023-12-31 LAB — CBC
HCT: 34.7 % — ABNORMAL LOW (ref 39.0–52.0)
Hemoglobin: 11.4 g/dL — ABNORMAL LOW (ref 13.0–17.0)
MCH: 28.4 pg (ref 26.0–34.0)
MCHC: 32.9 g/dL (ref 30.0–36.0)
MCV: 86.5 fL (ref 80.0–100.0)
Platelets: 187 10*3/uL (ref 150–400)
RBC: 4.01 MIL/uL — ABNORMAL LOW (ref 4.22–5.81)
RDW: 15.3 % (ref 11.5–15.5)
WBC: 11.5 10*3/uL — ABNORMAL HIGH (ref 4.0–10.5)
nRBC: 0 % (ref 0.0–0.2)

## 2023-12-31 LAB — BASIC METABOLIC PANEL WITH GFR
Anion gap: 10 (ref 5–15)
BUN: 9 mg/dL (ref 8–23)
CO2: 24 mmol/L (ref 22–32)
Calcium: 8.6 mg/dL — ABNORMAL LOW (ref 8.9–10.3)
Chloride: 101 mmol/L (ref 98–111)
Creatinine, Ser: 0.96 mg/dL (ref 0.61–1.24)
GFR, Estimated: >60 mL/min (ref >60)
Glucose, Bld: 99 mg/dL (ref 70–99)
Potassium: 4.6 mmol/L (ref 3.5–5.1)
Sodium: 135 mmol/L (ref 135–145)

## 2023-12-31 LAB — LACOSAMIDE: Lacosamide: 6.8 ug/mL (ref 5.0–10.0)

## 2023-12-31 MED ORDER — ROSUVASTATIN CALCIUM 20 MG PO TABS
20.0000 mg | ORAL_TABLET | Freq: Every day | ORAL | Status: DC
  Administered 2023-12-31 – 2024-01-01 (×2): 20 mg via ORAL
  Filled 2023-12-31 (×2): qty 1

## 2023-12-31 NOTE — Plan of Care (Signed)

## 2023-12-31 NOTE — TOC Progression Note (Addendum)
 Transition of Care [facility]) - Progression Note    Patient Details  Name: Seth [name] MRN: [id number] Date of Birth: Sep 30, 1955  Transition of Care [facility]) CM/SW Contact  Jaki Hammerschmidt A [place], LCSW Phone Number: 12/31/2023, 12:01 PM  Clinical Narrative:     Update 1430- CSW was notified by Cristino Donna that pt cannot be accepted at that facility, had funds owed to facility.  CSW to update daughter and inquire about other SNF placement options.    CSW met with pt at bedside to provide medicare.gov rating bed offers. Requested CSW reach out to pt's family as well to assist with bed choice. CSW to start insurance authorization once facility is chosen.   CSW also reached out to pt's daughter to provide bed offers for placement. She said that she would prefer [name], bed availability unknown at this time. She stated she would reach out to CSW tomorrow am with choice if [facility] couldn't accept pt at this time.    TOC will continue to follow.        Expected Discharge Plan and Services                                               Social Determinants of Health (SDOH) Interventions SDOH Screenings   Food Insecurity: No Food Insecurity (12/28/2023)  Housing: Low Risk  (12/28/2023)  Transportation Needs: No Transportation Needs (12/28/2023)  Utilities: Not At Risk (12/28/2023)  Depression (PHQ2-9): Low Risk  (08/23/2020)  Financial Resource Strain: Medium Risk (12/07/2017)  Social Connections: Unknown (12/28/2023)  Stress: Stress Concern Present (12/07/2017)  Tobacco Use: High Risk (12/29/2023)    Readmission Risk Interventions    12/22/2022    4:22 PM  Readmission Risk Prevention Plan  Transportation Screening Complete  HRI or Home Care Consult Complete  Social Work Consult for Recovery Care Planning/Counseling Complete  Palliative Care Screening Not Applicable  Medication Review Oceanographer) Complete

## 2023-12-31 NOTE — Care Management Obs Status (Signed)
 MEDICARE OBSERVATION STATUS NOTIFICATION   Patient Details  Name: Seth [name] MRN: [id number] Date of Birth: [birth year]   Medicare Observation Status Notification Given:  Yes  Spoke wirh patient representative and will mail a copy to her home address  [name] 12/31/2023, 3:30 PM

## 2023-12-31 NOTE — Progress Notes (Addendum)
 HD#0 Subjective:   Summary: Seth [name] is a 68 y.o. male with pertinent PMH of hypertension, COPD, seizure disorder who presented following a ground-level fall with subsequent humeral fracture, and is now status post ORIF of the right humerus.  Overnight Events: Febrile to 100.7  Patient feeling well overall.  He states that he had a bit of confusion in the morning, felt like when he had his stroke, but this quickly resolved.  Overall he states his mental status and cognition has improved as we have decreased his medications.  Denies any congestion, abdominal pain, diarrhea, dysuria, or suprapubic pain.  He does have a cough which he says has been going on for about 2 months and states he sometimes chokes when eating.  Objective:  Vital signs in last 24 hours: Vitals:   12/30/23 1649 12/30/23 1950 12/31/23 0442 12/31/23 0839  BP: (!) 151/77 (!) 151/84 (!) 161/91 (!) 139/95  Pulse: 94 88 96 98  Resp: 19 16 18 18   Temp: 99.4 F (37.4 C) 100 F (37.8 C) (!) 100.7 F (38.2 C) 99.2 F (37.3 C)  TempSrc: Oral Oral Oral Oral  SpO2: 100% 98% 98% 100%  Weight:      Height:       Supplemental O2: Room Air SpO2: 100 %   Physical Exam:  Constitutional: Chronically ill, in no acute distress Cardiovascular: Regular rate and rhythm pulmonary/Chest: Exam limited by patient positioning.  Normal work of breathing on room air, lungs clear to auscultation Abdominal: soft, non-tender, non-distended, positive bowel sounds Extremities: No lower extremity edema Neuro: Slow speech Psych: Pleasant affect  Filed Weights   12/27/23 1400 12/29/23 0644 12/29/23 0658  Weight: 75.6 kg (P) 75.6 kg 75.6 kg      Intake/Output Summary (Last 24 hours) at 12/31/2023 1148 Last data filed at 12/31/2023 0504 Gross per 24 hour  Intake --  Output 625 ml  Net -625 ml   Net IO Since Admission: 1,825 mL [12/31/23 1148]  Pertinent Labs:    Latest Ref Rng & Units 12/31/2023    4:35 AM 12/30/2023     4:44 PM 12/29/2023   12:41 PM  CBC  WBC 4.0 - 10.5 K/uL 11.5  11.6  11.0   Hemoglobin 13.0 - 17.0 g/dL 19.1  47.8  29.5   Hematocrit 39.0 - 52.0 % 34.7  33.1  34.1   Platelets 150 - 400 K/uL 187  149  154        Latest Ref Rng & Units 12/31/2023    4:35 AM 12/30/2023    4:44 PM 12/29/2023   12:41 PM  CMP  Glucose 70 - 99 mg/dL 99  98  621   BUN 8 - 23 mg/dL 9  10  13    Creatinine 0.61 - 1.24 mg/dL 3.08  6.57  8.46   Sodium 135 - 145 mmol/L 135  134  138   Potassium 3.5 - 5.1 mmol/L 4.6  4.3  4.1   Chloride 98 - 111 mmol/L 101  102  105   CO2 22 - 32 mmol/L 24  20  22    Calcium  8.9 - 10.3 mg/dL 8.6  8.2  8.5     Assessment/Plan:   Principal Problem:   Altered mental status Active Problems:   Closed displaced comminuted fracture of shaft of right humerus   Cerebrovascular accident (CVA) (HCC)   Patient Summary: Seth [name] is a 68 y.o. male with PMH of hypertension, COPD, seizure disorder who presented following  a ground-level fall with subsequent humeral fracture, and is now status post ORIF of the right humerus.  #Postop fever Etiology unclear at this time, may be in the setting of recent surgery, atelectasis.  Does have cough/endorses aspiration of food, but this is longstanding and chest x-ray on admission did not show intrathoracic process.  Will hold Tylenol  for now so as not to mask fever, provide incentive spirometer.  If patient continues to be febrile, will consider blood cultures, chest x-ray, antibiotics.  Otherwise well-appearing at this time.  #Altered mental status, resolved #History of seizures Likely secondary to polypharmacy in the setting of seizure disorder.  His antiepileptic medications have been down titrated, and his mental status has improved. - Neurology following, appreciate recs. - Cw Keppra  2000 mg, Vimpat  100 mg bid, phenobarbital  32.4 mg bid   #Fracture of proximal right humerus Status post right humeral ORIF May 3.  Resumed DVT ppx.   - Ortho  following, appreciate recs. - Nonweightbearing right upper extremity, sling - Will hold Tylenol  for now in setting of fever, has had minimal oxycodone  requirement.   #Bilateral cerebral infarcts #HLD  Prior history of right-sided weakness.  CT of the head, and MRI of the brain negative for new stroke. - Continue aspirin  81 mg daily - Initiate Lipitor 20 mg daily so as to avoid drug-drug interaction between phenobarb and Crestor    Diet:  Regular IVF: None,None VTE: Enoxaparin  Code: Full   Dispo: Anticipated discharge to Rehab in 2 days pending clinical improvement/stability.   Sheree Dieter MD Internal Medicine Resident PGY-1 Pager: [phone] Please contact the on call pager after 5 pm and on weekends at [phone].

## 2023-12-31 NOTE — Evaluation (Signed)
 Occupational Therapy Evaluation Patient Details Name: Seth [name] MRN: [id number] DOB: Jan 08, 1956 Today's Date: 12/31/2023   History of Present Illness   68 y.o. male with right humeral shaft fracture post fall at home.  S/p R humeral ORIF, 5/3.  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     Clinical Impressions Pt seen s/p R humeral ORIF, now needing up to max A for ADLs, mod A for bed mobility and min-mod +2 for step pivot transfer to chair, pt with decr ability to step RLE. Pt educated on NWB precautions for RUE and confirmed with MD sling for comfort and pt able to use R UE for ADL. Pt with notable edema in RUE, tolerates PROM to R elbow/wrist/hand, some of his R UE stroke  deficits have resurfaced, limited hand/wrist ROM. Pt presenting with impairments listed below, will follow acutely. Patient will benefit from continued inpatient follow up therapy, <3 hours/day to maximize safety/ind with ADL/functional mobility.      If plan is discharge home, recommend the following:   Two people to help with walking and/or transfers;A lot of help with bathing/dressing/bathroom;Assist for transportation;Assistance with cooking/housework     Functional Status Assessment   Patient has had a recent decline in their functional status and demonstrates the ability to make significant improvements in function in a reasonable and predictable amount of time.     Equipment Recommendations   Tub/shower bench;BSC/3in1     Recommendations for Other Services   PT consult     Precautions/Restrictions   Precautions Precautions: Fall Recall of Precautions/Restrictions: Impaired Required Braces or Orthoses: Sling Restrictions Weight Bearing Restrictions Per Provider Order: Yes RUE Weight Bearing Per Provider Order: Non weight bearing Other Position/Activity Restrictions: sling for comfort, ok to use RUE for ADLs per MD 5/5      Mobility Bed Mobility Overal bed mobility: Needs Assistance Bed Mobility: Supine to Sit     Supine to sit: Mod assist          Transfers Overall transfer level: Needs assistance Equipment used: Straight cane Transfers: Sit to/from Stand, Bed to chair/wheelchair/BSC Sit to Stand: Min assist, Mod assist, +2 physical assistance     Step pivot transfers: Min assist, Mod assist, +2 physical assistance            Balance Overall balance assessment: Needs assistance Sitting-balance support: Feet supported, Single extremity supported Sitting balance-[name] Scale: Fair Sitting balance - Comments: on EOB unsupported   Standing balance support: Single extremity supported, Reliant on assistive device for balance Standing balance-[name] Scale: Poor Standing balance comment: reliant on external support and AD.                           ADL either performed or assessed with clinical judgement   ADL Overall ADL's : Needs assistance/impaired Eating/Feeding: Sitting;Minimal assistance   Grooming: Minimal assistance;Sitting   Upper Body Bathing: Moderate assistance;Sitting Upper Body Bathing Details (indicate cue type and reason): to wash under R arm Lower Body Bathing: Moderate assistance   Upper Body Dressing : Moderate assistance;Sitting Upper Body Dressing Details (indicate cue type and reason): donning clean gown Lower Body Dressing: Maximal assistance;Sitting/lateral leans;Sit to/from stand   Toilet Transfer: Moderate assistance;+2 for physical assistance;Minimal assistance   Toileting- Clothing Manipulation and Hygiene: Minimal assistance;Moderate assistance;+2 for physical assistance               Vision   Vision Assessment?:  No apparent visual deficits     Perception Perception: Not tested       Praxis Praxis: Not tested       Pertinent Vitals/Pain Pain Assessment Pain Assessment: Faces Pain Score: 4  Faces Pain Scale: Hurts little  more Pain Location: R shoulder with mobility Pain Descriptors / Indicators: Grimacing Pain Intervention(s): Limited activity within patient's tolerance, Monitored during session, Repositioned     Extremity/Trunk Assessment Upper Extremity Assessment Upper Extremity Assessment: RUE deficits/detail RUE Deficits / Details: Pt now s/p ORIF of humerus, reoports weakness/recrudescenceof stroke symtoms from prior CVA but was able to use funcitonally before, moderate edema, minimally able to wiggle digits/move wrist, pt tolerating PROM of elbow   Lower Extremity Assessment Lower Extremity Assessment: Defer to PT evaluation   Cervical / Trunk Assessment Cervical / Trunk Assessment: Kyphotic   Communication Communication Communication: Impaired Factors Affecting Communication: Reduced clarity of speech   Cognition Arousal: Alert Behavior During Therapy: WFL for tasks assessed/performed Cognition: No family/caregiver present to determine baseline             OT - Cognition Comments: pt inconsistent with resonses, needs incr cues for clarification foh ome set up throughout                 Following commands: Intact Following commands impaired: Only follows one step commands consistently, Follows one step commands with increased time     Cueing  General Comments   Cueing Techniques: Verbal cues;Tactile cues  VSS   Exercises     Shoulder Instructions      Home Living Family/patient expects to be discharged to:: Private residence Living Arrangements: Parent;Other relatives (sister) Available Help at Discharge: Family;Available 24 hours/day Type of Home: House Home Access: Level entry     Home Layout: Two level;Able to live on main level with bedroom/bathroom   Alternate Level Stairs-Rails: Right;Left;Can reach both Bathroom Shower/Tub: Tub/shower unit   Bathroom Toilet: Handicapped height Bathroom Accessibility: No   Home Equipment: Grab bars - tub/shower;Grab  bars - toilet;Rolling [place] (2 wheels);Cane - single point;Shower seat          Prior Functioning/Environment Prior Level of Function : Needs assist             Mobility Comments: ambulates with RW vs cane ADLs Comments: sister assists for dressing and meds    OT Problem List:     OT Treatment/Interventions: Self-care/ADL training;Therapeutic activities;Therapeutic exercise;Patient/family education;Balance training;DME and/or AE instruction      OT Goals(Current goals can be found in the care plan section)   Acute Rehab OT Goals Patient Stated Goal: none stated OT Goal Formulation: With patient Time For Goal Achievement: 01/14/24 Potential to Achieve Goals: Fair   OT Frequency:  Min 2X/week    Co-evaluation              AM-PAC OT "6 Clicks" Daily Activity     Outcome Measure Help from another person eating meals?: A Little Help from another person taking care of personal grooming?: A Little Help from another person toileting, which includes using toliet, bedpan, or urinal?: A Lot Help from another person bathing (including washing, rinsing, drying)?: A Lot Help from another person to put on and taking off regular upper body clothing?: A Lot Help from another person to put on and taking off regular lower body clothing?: A Lot 6 Click Score: 14   End of Session Equipment Utilized During Treatment: Gait belt;Other (comment) (SPC, sling) Nurse Communication: Mobility status  Activity Tolerance:  Patient limited by lethargy Patient left: in chair;with call bell/phone within reach;with chair alarm set  OT Visit Diagnosis: Unsteadiness on feet (R26.81);Muscle weakness (generalized) (M62.81);History of falling (Z91.81);Pain Hemiplegia - Right/Left: Right Pain - Right/Left: Right Pain - part of body: Arm                Time: 1610-9604 OT Time Calculation (min): 28 min Charges:  OT General Charges $OT Visit: 1 Visit OT Evaluation $OT Re-eval: 1 Re-eval OT  Treatments $Self Care/Home Management : 8-22 mins  [name] Patton K, OTD, OTR/L SecureChat Preferred Acute Rehab [phone]   Maxine Fredman K Koonce 12/31/2023, 1:21 PM

## 2023-12-31 NOTE — Progress Notes (Signed)
 Mobility Specialist: Progress Note   12/31/23 1500  Mobility  Activity Transferred from chair to bed  Level of Assistance Moderate assist, patient does 50-74%  Assistive Device Other (Comment) (HHA)  RUE Weight Bearing Per Provider Order NWB  Activity Response Tolerated well  Mobility Referral Yes  Mobility visit 1 Mobility  Mobility Specialist Start Time (ACUTE ONLY) 1210  Mobility Specialist Stop Time (ACUTE ONLY) 1217  Mobility Specialist Time Calculation (min) (ACUTE ONLY) 7 min    Pt requested to get back to bed, received in chair. ModA w/ HHA for STS and stand pivot to bed. Required modA to laterally scoot towards HOB. MinA for sit>supine bed mobility to assist with BLEs. Left in chair position in bed with all needs met, call bell in reach. Bed alarm on.  Deloria Fetch Mobility Specialist Please contact via SecureChat or Rehab office at [phone]

## 2024-01-01 ENCOUNTER — Encounter (HOSPITAL_COMMUNITY): Payer: Self-pay | Admitting: Sports Medicine

## 2024-01-01 DIAGNOSIS — S42351A Displaced comminuted fracture of shaft of humerus, right arm, initial encounter for closed fracture: Secondary | ICD-10-CM | POA: Diagnosis not present

## 2024-01-01 DIAGNOSIS — R569 Unspecified convulsions: Secondary | ICD-10-CM | POA: Diagnosis not present

## 2024-01-01 LAB — BASIC METABOLIC PANEL WITH GFR
Anion gap: 10 (ref 5–15)
BUN: 13 mg/dL (ref 8–23)
CO2: 23 mmol/L (ref 22–32)
Calcium: 8.5 mg/dL — ABNORMAL LOW (ref 8.9–10.3)
Chloride: 102 mmol/L (ref 98–111)
Creatinine, Ser: 0.94 mg/dL (ref 0.61–1.24)
GFR, Estimated: >60 mL/min (ref >60)
Glucose, Bld: 90 mg/dL (ref 70–99)
Potassium: 4.3 mmol/L (ref 3.5–5.1)
Sodium: 135 mmol/L (ref 135–145)

## 2024-01-01 LAB — CBC
HCT: 29.5 % — ABNORMAL LOW (ref 39.0–52.0)
Hemoglobin: 9.6 g/dL — ABNORMAL LOW (ref 13.0–17.0)
MCH: 28.1 pg (ref 26.0–34.0)
MCHC: 32.5 g/dL (ref 30.0–36.0)
MCV: 86.3 fL (ref 80.0–100.0)
Platelets: 204 10*3/uL (ref 150–400)
RBC: 3.42 MIL/uL — ABNORMAL LOW (ref 4.22–5.81)
RDW: 15.2 % (ref 11.5–15.5)
WBC: 8.6 10*3/uL (ref 4.0–10.5)
nRBC: 0 % (ref 0.0–0.2)

## 2024-01-01 MED ORDER — RIVAROXABAN 10 MG PO TABS
10.0000 mg | ORAL_TABLET | Freq: Every day | ORAL | Status: DC
  Administered 2024-01-01: 10 mg via ORAL
  Filled 2024-01-01: qty 1

## 2024-01-01 MED ORDER — PHENOBARBITAL 32.4 MG PO TABS: 32.4000 mg | ORAL_TABLET | Freq: Two times a day (BID) | ORAL | 0 refills | Status: DC

## 2024-01-01 MED ORDER — LACOSAMIDE 100 MG PO TABS: 100.0000 mg | ORAL_TABLET | Freq: Two times a day (BID) | ORAL | Status: DC

## 2024-01-01 MED ORDER — ROSUVASTATIN CALCIUM 20 MG PO TABS: 20.0000 mg | ORAL_TABLET | Freq: Every day | ORAL | Status: AC

## 2024-01-01 NOTE — Care Management Obs Status (Cosign Needed)
 MEDICARE OBSERVATION STATUS NOTIFICATION   Patient Details  Name: Seth [name] MRN: [id number] Date of Birth: April 28, 1956   Medicare Observation Status Notification Given:  Yes    Dane Dung, RN 01/01/2024, 9:38 AM

## 2024-01-01 NOTE — TOC Progression Note (Addendum)
 Transition of Care [facility]) - Progression Note    Patient Details  Name: Seth [name] MRN: [id number] Date of Birth: [birth year]  Transition of [facility]) CM/SW Contact  Asta Corbridge A [place], LCSW Phone Number: 01/01/2024, 9:43 AM  Clinical Narrative:     Update 1146  Auth approved for [name]. Plan Auth ID: [id number] Auth ID: [id number]. Provider notified, plan for DC today.   Update 1026 Auth started for [facility].  Authorization status pending.  Auth [id number]   [name] have bed available today. Provider notified.   2536 CSW spoke with pt's daughter, [place]. Informed her that pt cannot go to [facility] due to [employer]. Provided contact information for business office manager.   She selected [name] for placement. CSW reached out to facility to find out regarding bed availability.   CSW to start insurance authorization as pt is near medical stability.    TOC will continue to follow.       Expected Discharge Plan and Services                                               Social Determinants of Health (SDOH) Interventions SDOH Screenings   Food Insecurity: No Food Insecurity (12/28/2023)  Housing: Low Risk  (12/28/2023)  Transportation Needs: No Transportation Needs (12/28/2023)  Utilities: Not At Risk (12/28/2023)  Depression (PHQ2-9): Low Risk  (08/23/2020)  Financial Resource Strain: Medium Risk (12/07/2017)  Social Connections: Unknown (12/28/2023)  Stress: Stress Concern Present (12/07/2017)  Tobacco Use: High Risk (12/29/2023)    Readmission Risk Interventions    12/22/2022    4:22 PM  Readmission Risk Prevention Plan  Transportation Screening Complete  HRI or Home Care Consult Complete  Social Work Consult for Recovery Care Planning/Counseling Complete  Palliative Care Screening Not Applicable  Medication Review Oceanographer) Complete

## 2024-01-01 NOTE — TOC Transition Note (Addendum)
 Transition of Care [facility]) - Discharge Note   Patient Details  Name: Seth [name] MRN: [id number] Date of Birth: 06/05/1956  Transition of Care [facility]) CM/SW Contact:  Aser Nylund A [place], LCSW Phone Number: 01/01/2024, 3:34 PM   Clinical Narrative:     Patient will DC to: [name]  Anticipated DC date:  01/01/24  Family notified: Wess Hammed  Transport by: Lyna Sandhoff  Authorization approved.  Plan Auth ID: [id number] Auth ID: [id number]   Approval Dates 01/01/24-01/03/24  Per MD patient ready for DC to [name]. RN, patient, patient's family, and facility notified of DC. Discharge Summary and FL2 sent to facility. RN to call report prior to discharge ( [phone], room 503). DC packet on chart. Ambulance transport requested for patient.     CSW will sign off for now as social work intervention is no longer needed. Please consult us  again if new needs arise.    Final next level of care: Skilled Nursing Facility Barriers to Discharge: Barriers Resolved   Patient Goals and CMS Choice            Discharge Placement              Patient chooses bed at: [facility] Patient to be transferred to facility by: [facility] Name of family member notified: Wess Hammed Patient and family notified of of transfer: 01/01/24  Discharge Plan and Services Additional resources added to the After Visit Summary for                                       Social Drivers of Health (SDOH) Interventions SDOH Screenings   Food Insecurity: No Food Insecurity (12/28/2023)  Housing: Low Risk  (12/28/2023)  Transportation Needs: No Transportation Needs (12/28/2023)  Utilities: Not At Risk (12/28/2023)  Depression (PHQ2-9): Low Risk  (08/23/2020)  Financial Resource Strain: Medium Risk (12/07/2017)  Social Connections: Unknown (12/28/2023)  Stress: Stress Concern Present (12/07/2017)  Tobacco Use: High Risk (12/29/2023)     Readmission Risk Interventions    12/22/2022    4:22  PM  Readmission Risk Prevention Plan  Transportation Screening Complete  HRI or Home Care Consult Complete  Social Work Consult for Recovery Care Planning/Counseling Complete  Palliative Care Screening Not Applicable  Medication Review Oceanographer) Complete

## 2024-01-01 NOTE — Discharge Summary (Addendum)
 Name: Seth [name] MRN: [id number] DOB: 07-04-1956 68 y.o. PCP: Maryellen Snare, NP  Date of Admission: 12/27/2023  2:03 PM Date of Discharge:  01/01/2024 Attending Physician: Dr.  Ancil Balzarine  DISCHARGE DIAGNOSIS:  Primary Problem: Closed displaced comminuted fracture of shaft of right humerus   Hospital Problems: Principal Problem:   Closed displaced comminuted fracture of shaft of right humerus Active Problems:   COPD (chronic obstructive pulmonary disease) (HCC)   Seizures (HCC)   Cerebrovascular accident (CVA) (HCC)    DISCHARGE MEDICATIONS:   Allergies as of 01/01/2024   No Known Allergies      Medication List     PAUSE taking these medications    amLODipine  10 MG tablet Wait to take this until your doctor or other care provider tells you to start again. Commonly known as: NORVASC  Take 1 tablet (10 mg total) by mouth daily.   lisinopril  10 MG tablet Wait to take this until your doctor or other care provider tells you to start again. Commonly known as: ZESTRIL  Take 1 tablet (10 mg total) by mouth daily.       STOP taking these medications    atorvastatin  40 MG tablet Commonly known as: LIPITOR       TAKE these medications    aspirin  EC 81 MG tablet Take 1 tablet (81 mg total) by mouth daily. Swallow whole.   B-1 100 MG Tabs Take 1 tablet by mouth every morning.   carvedilol  6.25 MG tablet Commonly known as: COREG  Take 6.25 mg by mouth in the morning and at bedtime.   folic acid  1 MG tablet Commonly known as: FOLVITE  Take 1 tablet (1 mg total) by mouth daily.   gabapentin  300 MG capsule Commonly known as: NEURONTIN  Take 1 capsule (300 mg total) by mouth 3 (three) times daily. What changed: when to take this   Lacosamide  100 MG Tabs Take 1 tablet (100 mg total) by mouth 2 (two) times daily. What changed: when to take this   levETIRAcetam  1000 MG tablet Commonly known as: KEPPRA  Take 2 tablets (2,000 mg total) by mouth 2 (two) times daily.    omeprazole 40 MG capsule Commonly known as: PRILOSEC Take 40 mg by mouth in the morning and at bedtime.   PHENobarbital  32.4 MG tablet Commonly known as: LUMINAL Take 1 tablet (32.4 mg total) by mouth 2 (two) times daily. What changed:  medication strength how much to take   rosuvastatin  20 MG tablet Commonly known as: CRESTOR  Take 1 tablet (20 mg total) by mouth daily. Start taking on: Jan 02, 2024               Discharge Care Instructions  (From admission, onward)           Start     Ordered   01/01/24 0000  Discharge wound care:       Comments: Keep dressings clean and dry, okay to change with regular dry gauze if needed   01/01/24 1453            DISPOSITION AND FOLLOW-UP:  Seth [name] was discharged from [facility] in [place] condition. At the hospital follow up visit please address:  Follow-up Recommendations: Consults: Neurology, orthopedics Labs: CBC Studies: None Medications: Please note dose reduction of his phenobarbital .  Please do not further dose reduce phenobarbital  until he is able to see neurology provider.  He has been normotensive on Coreg  6.25 mg twice daily during his hospital admission, please reassess blood  pressures outpatient and consider reinitiation of prior regimen as needed.   Follow-up Appointments:  Contact information for after-discharge care     Destination     [facility] Preferred SNF .   Service: Skilled Nursing Contact information: [address] [place]  [phone]                     HOSPITAL COURSE:  Patient Summary: This is a 68 year old gentleman who presented to the emergency department with altered mental status and a fall and was subsequently found to have a right proximal humeral fracture.  He subsequently underwent ORIF with orthopedic surgery. His hospital stay was complicated by post op fever, which was not  felt to be infectious in nature and resolved on post-op day two.   #Altered mental status, resolved #History of seizures Likely secondary to polypharmacy in the setting of seizure disorder.  This was discussed with neurology, phenobarbital  decreased to 32.4 mg twice daily. His mental status has improved. - Patient will benefit from outpatient neurology follow-up. 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  Please follow-up as clinically indicated outpatient.   #Fracture of proximal right humerus Status post right humeral ORIF May 3.  Doing well postop. Nonweightbearing right upper extremity Sling is optional Okay to use right upper extremity for activities of daily living that are nonweightbearing Keep dressings clean and dry, okay to change with regular dry gauze if needed   #Bilateral cerebral infarcts #HLD  Prior history of right-sided weakness.  CT of the head, and MRI of the brain negative for new stroke. - Continue aspirin  81 mg daily - Initiate rosuvastatin  20 mg daily so as to avoid drug-drug interaction between phenobarb and atorvastatin    DISCHARGE INSTRUCTIONS:   Discharge Instructions     Call MD for:  redness, tenderness, or signs of infection (pain, swelling, redness, odor or green/yellow discharge around incision site)   Complete by: As directed    Call MD for:  severe uncontrolled pain   Complete by: As directed    Call MD for:  temperature >100.4   Complete by: As directed    Discharge wound care:   Complete by: As directed    Keep dressings clean and dry, okay to change with regular dry gauze if needed       SUBJECTIVE:  Seen on rounds today, patient was doing well.  Continues to have more mental clarity and less confusion.  He is doing well from a surgical perspective, minimal pain in the right upper extremity.  Afebrile overnight, no acute concerns  at this time.  Discharge Vitals:   BP 127/70 (BP Location: Left Arm)   Pulse 94   Temp 98 F (36.7 C) (Oral)   Resp 18   Ht 6\' 3"  (1.905 m)   Wt 75.6 kg   SpO2 97%   BMI 20.83 kg/m   OBJECTIVE:  Physical Exam Constitutional:      Appearance: Normal appearance.  Cardiovascular:     Rate and Rhythm: Normal rate and regular rhythm.  Pulmonary:     Effort: Pulmonary effort is normal.     Breath sounds: Normal breath sounds.  Abdominal:     General: Abdomen is flat.     Palpations: Abdomen is soft.     Tenderness: There is no abdominal tenderness.  Musculoskeletal:     Comments: Right upper  extremity in sling, overlying bandages in place without frank erythema, swelling, or purulent discharge appreciated.  Neurological:     Mental Status: He is alert. Mental status is at baseline.      Pertinent Labs, Studies, and Procedures:     Latest Ref Rng & Units 01/01/2024    6:10 AM 12/31/2023    4:35 AM 12/30/2023    4:44 PM  CBC  WBC 4.0 - 10.5 K/uL 8.6  11.5  11.6   Hemoglobin 13.0 - 17.0 g/dL 9.6  40.9  81.1   Hematocrit 39.0 - 52.0 % 29.5  34.7  33.1   Platelets 150 - 400 K/uL 204  187  149        Latest Ref Rng & Units 01/01/2024    6:10 AM 12/31/2023    4:35 AM 12/30/2023    4:44 PM  CMP  Glucose 70 - 99 mg/dL 90  99  98   BUN 8 - 23 mg/dL 13  9  10    Creatinine 0.61 - 1.24 mg/dL 9.14  7.82  9.56   Sodium 135 - 145 mmol/L 135  135  134   Potassium 3.5 - 5.1 mmol/L 4.3  4.6  4.3   Chloride 98 - 111 mmol/L 102  101  102   CO2 22 - 32 mmol/L 23  24  20    Calcium  8.9 - 10.3 mg/dL 8.5  8.6  8.2     MR BRAIN WO CONTRAST Result Date: 12/27/2023 CLINICAL DATA:  Initial evaluation for acute mental status change, unknown cause. EXAM: MRI HEAD WITHOUT CONTRAST TECHNIQUE: Multiplanar, multiecho pulse sequences of the brain and surrounding structures were obtained without intravenous contrast. COMPARISON:  CT from earlier the same day. FINDINGS: Brain: Diffuse prominence of the CSF  containing spaces compatible generalized cerebral atrophy. Patchy T2/FLAIR hyperintensity involving the periventricular deep white matter both cerebral hemispheres, consistent chronic small vessel ischemic disease, mild in nature. Multiple scattered remote bilateral cerebellar infarcts. Cerebral volume loss about the left cerebral hemisphere with associated superficial siderosis, stable. Superimposed chronic microhemorrhage at the anterior left temporal lobe also stable. Associated ex vacuo dilatation of the left lateral ventricle. No abnormal foci of restricted diffusion to suggest acute or subacute ischemia. Gray-white matter differentiation maintained. No acute intracranial hemorrhage. 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 No other mass lesion, mass effect or midline shift. Ventricular prominence with ex vacuo dilatation of the left lateral ventricle, stable. No extra-axial fluid collection. Pituitary gland within normal limits. Vascular: Major intracranial vascular flow voids are maintained. Signal abnormality about the partially visualized distal cervical left ICA related to previously identified pseudoaneurysm, partially visualized (series 8, image 1). Skull and upper cervical spine: Cranial junction within normal limits. Postsurgical changes partially visualize within the upper cervical spine. Bone marrow signal intensity normal. No scalp soft tissue abnormality. Sinuses/Orbits: Globes orbital soft tissues within normal limits. Scattered mucosal thickening noted about the paranasal sinuses. No significant mastoid effusion. Other: None. IMPRESSION: 1. No acute intracranial abnormality. 2. Cerebral atrophy with mild chronic microvascular ischemic disease, with multiple scattered remote bilateral cerebellar infarcts. 3. Asymmetric volume loss about the left cerebral hemisphere with associated chronic superficial siderosis, stable. 4. 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 5. Signal abnormality about the partially visualized distal cervical left ICA related to previously identified pseudoaneurysm, partially visualized. Electronically Signed   By: Virgia Griffins M.D.   On: 12/27/2023 18:31   CT HUMERUS RIGHT WO CONTRAST  Result Date: 12/27/2023 CLINICAL DATA:  Upper arm trauma EXAM: CT OF THE RIGHT HUMERUS WITHOUT CONTRAST TECHNIQUE: Multidetector CT imaging was performed according to the standard protocol. Multiplanar CT image reconstructions were also generated. RADIATION DOSE REDUCTION: This exam was performed according to the departmental dose-optimization program which includes automated exposure control, adjustment of the mA and/or kV according to patient size and/or use of iterative reconstruction technique. COMPARISON:  Right humerus radiographs dated 12/27/2023 at 2:40 p.m. FINDINGS: Bones/Joint/Cartilage Comminuted oblique long segment fracture of the right proximal humerus extending from the humeral head and neck junction distally through the mid diaphysis. There is approximately 1.5 cm of medial displacement of the distal fracture component. There is approximately 25-30 degrees of apex anterior angulation involving the comminuted fracture fragment and the distal shaft component. No dislocation. The glenohumeral and acromioclavicular joints are anatomically aligned. Nondisplaced fractures of the right seventh, eighth, and ninth ribs. No visualized pneumothorax. Ligaments Ligaments are suboptimally evaluated by CT. Muscles and Tendons Intramuscular edema and soft tissue swelling surrounding the proximal humeral fracture described above. No sizable intramuscular fluid collection or hematoma. Soft tissue No fluid collection or hematoma.  No soft tissue mass. IMPRESSION: 1. Acute comminuted, displaced, and angulated oblique fracture of the right proximal humerus extending from the humeral head and neck junction through the mid diaphysis. 2.  Nondisplaced fractures of the right seventh, eighth, and ninth ribs. No visualized pneumothorax. Electronically Signed   By: Mannie Seek M.D.   On: 12/27/2023 17:42   EEG adult Result Date: 12/27/2023 Arleene Lack, MD     12/27/2023  5:43 PM Patient Name: Seth [name] MRN: [id number] Epilepsy Attending: Arleene Lack Referring Physician/Provider: Audrene Lease, NP Date: 12/27/2023 Duration: 23.29 mins Patient history: 68 yo with pmhx significant for depression, htn, cva, seizures, and copd. Pt lives with his sister and she found him on the ground. His right arm was bent at a strange angle. He has a hx of prior cva with slurred speech and right arm weakness. EEG to evaluate for seizure Level of alertness: Awake, asleep AEDs during EEG study:  LEV, LCM, Phenobarb, GBP Technical aspects: This EEG study was done with scalp electrodes positioned according to the 10-20 International system of electrode placement. Electrical activity was reviewed with band pass filter of 1-70Hz , sensitivity of 7 uV/mm, display speed of 70mm/sec with a 60Hz  notched filter applied as appropriate. EEG data were recorded continuously and digitally stored.  Video monitoring was available and reviewed as appropriate. Description: The posterior dominant rhythm consists of 8-9 Hz activity of moderate voltage (25-35 uV) seen predominantly in posterior head regions, symmetric and reactive to eye opening and eye closing. Sleep was characterized by sleep spindles (12 to 14 Hz), maximal frontocentral region. EEG showed continuous generalized 3 to 5 Hz theta- delta slowing admixed with 13-15hz  beta activity.  Hyperventilation and photic stimulation were not performed.    ABNORMALITY - Continuous slow, generalized  IMPRESSION: This study is suggestive of moderate diffuse encephalopathy. No seizures or epileptiform discharges were seen throughout the recording.  Please note that lack of epileptiform activity during interictal EEG does not  exclude the diagnosis of epilepsy.   Arleene Lack   DG Pelvis Portable Result Date: 12/27/2023 CLINICAL DATA:  Marvell Slider, code stroke, right arm pain EXAM: PORTABLE PELVIS 1-2 VIEWS COMPARISON:  02/13/2023 FINDINGS: Single frontal view of the pelvis includes both hips. No acute displaced fracture, subluxation, or dislocation. Mild symmetrical bilateral hip osteoarthritis unchanged. The sacroiliac joints are  normal. IMPRESSION: 1. No acute displaced fracture.  Stable osteoarthritis. Electronically Signed   By: Bobbye Burrow M.D.   On: 12/27/2023 15:37   DG Chest Portable 1 View Result Date: 12/27/2023 CLINICAL DATA:  Marvell Slider, right upper arm pain EXAM: PORTABLE CHEST 1 VIEW COMPARISON:  02/13/2023 FINDINGS: Single frontal view of the chest excludes the left costophrenic angle by collimation. No acute airspace disease, effusion, or pneumothorax. The cardiac silhouette is unremarkable. Comminuted proximal right humeral fracture extending from the humeral neck through the proximal humeral diaphysis. IMPRESSION: 1. Comminuted proximal right humeral fracture. Please see dedicated right humeral study. 2. No acute intrathoracic process. Electronically Signed   By: Bobbye Burrow M.D.   On: 12/27/2023 15:37   DG Humerus Right Result Date: 12/27/2023 CLINICAL DATA:  Marvell Slider, right arm pain EXAM: RIGHT HUMERUS - 2+ VIEW COMPARISON:  None Available. FINDINGS: Frontal and lateral views of the right humerus are obtained on 3 images. There is a long segment comminuted displaced fracture of the proximal humerus. The fracture line extends through the humeral neck and greater tuberosity superiorly, with inferior extent through the proximal humeral diaphysis. Ventral angulation at the fracture site. Anatomic alignment of the shoulder and elbow. Soft tissue swelling throughout the upper arm. Visualized portions of the right chest are clear. IMPRESSION: 1. Long segment comminuted displaced fracture of the proximal humerus, extending  from the humeral neck through the proximal humeral diaphysis. Ventral angulation at the fracture site. Electronically Signed   By: Bobbye Burrow M.D.   On: 12/27/2023 15:36   CT Cervical Spine Wo Contrast Result Date: 12/27/2023 CLINICAL DATA:  Neck trauma EXAM: CT CERVICAL SPINE WITHOUT CONTRAST TECHNIQUE: Multidetector CT imaging of the cervical spine was performed without intravenous contrast. Multiplanar CT image reconstructions were also generated. RADIATION DOSE REDUCTION: This exam was performed according to the departmental dose-optimization program which includes automated exposure control, adjustment of the mA and/or kV according to patient size and/or use of iterative reconstruction technique. COMPARISON:  11/21/2023, 10/09/2023 FINDINGS: Alignment: Alignment is grossly anatomic. Skull base and vertebrae: No acute fracture. No primary bone lesion or focal pathologic process. Soft tissues and spinal canal: No prevertebral fluid or swelling. No visible canal hematoma. Disc levels: Prior C3-4 ACDF. Multilevel facet hypertrophy greatest at C2-3. Mild C4-5 and C5-6 spondylosis. Upper chest: Airway is patent. Emphysematous changes at the lung apices. Other: Reconstructed images demonstrate no additional findings. IMPRESSION: 1. No acute cervical spine fracture. 2. Stable C3-4 ACDF. 3. Stable mild multilevel spondylosis and facet hypertrophy as above. Electronically Signed   By: Bobbye Burrow M.D.   On: 12/27/2023 15:34   CT HEAD CODE STROKE WO CONTRAST Result Date: 12/27/2023 CLINICAL DATA:  Code stroke. Neuro deficit, concern for stroke, slurred speech. EXAM: CT HEAD WITHOUT CONTRAST TECHNIQUE: Contiguous axial images were obtained from the base of the skull through the vertex without intravenous contrast. RADIATION DOSE REDUCTION: This exam was performed according to the departmental dose-optimization program which includes automated exposure control, adjustment of the mA and/or kV according to patient  size and/or use of iterative reconstruction technique. COMPARISON:  MRI head 11/22/2023. FINDINGS: Brain: No acute intracranial hemorrhage. No CT evidence of acute infarct. Nonspecific hypoattenuation in the periventricular and subcortical white matter favored to reflect chronic microvascular ischemic changes. Remote lacunar infarcts in the right thalamus. Additional remote infarcts in the bilateral cerebellum. 1.2 cm slightly hyperattenuating parafalcine mass abutting the parasagittal right frontal lobe likely reflecting a meningioma. No edema, mass effect, or midline shift. The basilar  cisterns are patent. Ventricles: Similar moderate ventriculomegaly likely related to parenchymal volume loss. Similar asymmetry of the left lateral ventricle. Vascular: Atherosclerotic calcifications of the carotid siphons. No hyperdense vessel. Skull: No acute or aggressive finding. Orbits: Orbits are symmetric. Sinuses: Mild mucosal thickening in the ethmoid and maxillary sinuses as well as the right frontal sinus. Other: Mastoid air cells are clear. 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 ASPECTS [facility] Stroke Program Early CT Score) - Ganglionic level infarction (caudate, lentiform nuclei, internal capsule, insula, M1-M3 cortex): 7 - Supraganglionic infarction (M4-M6 cortex): 3 Total score (0-10 with 10 being normal): 10 IMPRESSION: 1. No CT evidence of acute intracranial abnormality. 2. Moderate chronic microvascular ischemic changes and parenchymal volume loss. 3. Remote infarcts in the right thalamus and bilateral cerebellum. 4. 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 5. 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 6. ASPECTS is 10 Finding of no acute abnormality and aspects score results were communicated to Dr. Lindzen At 2:46 pm on 12/27/2023 by text page  via the [facility] messaging system. Electronically Signed   By: Denny Flack M.D.   On: 12/27/2023 14:51     Signed: Sheree Dieter MD Internal Medicine Resident, PGY-1 [facility]  Pager: [phone] 2:57 PM, 01/01/2024

## 2024-01-01 NOTE — Plan of Care (Signed)
 Patient is A&O x 3, confused. VSS, on room air. Right arm with dressing CDI, Sling in place, +radial pulse, denies pain. Safety maintained. Bed alarm on. Call bell in reach. Will continue to monitor.    Problem: Education: Goal: Knowledge of General Education information will improve Description: Including pain rating scale, medication(s)/side effects and non-pharmacologic comfort measures Outcome: Progressing   Problem: Clinical Measurements: Goal: Ability to maintain clinical measurements within normal limits will improve Outcome: Progressing Goal: Will remain free from infection Outcome: Progressing Goal: Diagnostic test results will improve Outcome: Progressing Goal: Respiratory complications will improve Outcome: Progressing Goal: Cardiovascular complication will be avoided Outcome: Progressing   Problem: Activity: Goal: Risk for activity intolerance will decrease Outcome: Progressing   Problem: Nutrition: Goal: Adequate nutrition will be maintained Outcome: Progressing   Problem: Coping: Goal: Level of anxiety will decrease Outcome: Progressing   Problem: Elimination: Goal: Will not experience complications related to bowel motility Outcome: Progressing Goal: Will not experience complications related to urinary retention Outcome: Progressing   Problem: Pain Managment: Goal: General experience of comfort will improve and/or be controlled Outcome: Progressing   Problem: Safety: Goal: Ability to remain free from injury will improve Outcome: Progressing   Problem: Skin Integrity: Goal: Risk for impaired skin integrity will decrease Outcome: Progressing

## 2024-01-01 NOTE — Plan of Care (Signed)

## 2024-01-01 NOTE — Progress Notes (Signed)
 Physical Therapy Treatment Patient Details Name: Seth [name] MRN: [id number] DOB: August 11, 1956 Today's Date: 01/01/2024   History of Present Illness 68 y.o. male with right humeral shaft fracture post fall at home.  S/p R humeral ORIF, 5/3. 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    PT Comments  Pt received in supine, pleasantly agreeable to therapy session and with good participation and improved tolerance for transfer and standing exercises/pre-gait training with quad cane. Pt still demonstrates posterior instability with transfers/standing but with increased repetition and reciprocal transfer training, pt demos improved R foot clearance/knee flexion, but still with inconsistent R foot placement and needing up to modA for lift assist and stability with transfers between bed and chair. Pt bed linens wet, RN notified pt may need lower body bath due to apparent urinary incontinence. Patient will benefit from continued inpatient follow up therapy, <3 hours/day.    If plan is discharge home, recommend the following: Assistance with cooking/housework;Assist for transportation;Help with stairs or ramp for entrance;Supervision due to cognitive status;A lot of help with walking and/or transfers;A lot of help with bathing/dressing/bathroom   Can travel by private vehicle     Yes  Equipment Recommendations  Cane;Other (comment);BSC/3in1 (QC)    Recommendations for Other Services       Precautions / Restrictions Precautions Precautions: Fall;Other (comment) Recall of Precautions/Restrictions: Impaired Precaution/Restrictions Comments: seizure precs; pt appears to have urinary incontinence but unsure if he is aware (RN notified) Required Braces or Orthoses: Sling Restrictions Weight Bearing Restrictions Per Provider Order: Yes RUE Weight Bearing Per Provider Order: Non weight bearing Other Position/Activity Restrictions: sling for comfort,  ok to use RUE for ADLs per MD 5/5     Mobility  Bed Mobility Overal bed mobility: Needs Assistance Bed Mobility: Supine to Sit     Supine to sit: Min assist, HOB elevated, Used rails     General bed mobility comments: Min A at trunk to get to sitting with sequencing for getting to EOB.    Transfers Overall transfer level: Needs assistance Equipment used: Quad cane Transfers: Sit to/from Stand, Bed to chair/wheelchair/BSC Sit to Stand: Mod assist   Step pivot transfers: Min assist, Mod assist, +2 physical assistance       General transfer comment: pt performed sit to stand 2x from EOB with posterior instability and heavy reliance on bed frame to support his limbs during transfer; pre-gait tasks such as sidesteps toward [facility] on his L, seated break, then pivot to chair on his L side with minA to pivot using QC.    Ambulation/Gait Ambulation/Gait assistance: Min assist, Mod assist Gait Distance (Feet): 4 Feet Assistive device: Quad cane Gait Pattern/deviations: Knee hyperextension - right, Leaning posteriorly, Wide base of support, Decreased dorsiflexion - right, Step-to pattern, Shuffle     Pre-gait activities: standing RLE hip flexion x 5 reps x3 sets General Gait Details: ~40ft pivotal steps and forward/backward stepping in front of bed and chair x2 reps; consistent posterior instability and difficulty with RLE foot placement, tending to keep it ahead and abducted from midline, cues for improved BOS and some AA on RLE when stepping at times. R knee flexion improved with repetitive practice/marching in place with RLE only mutliple reps   Stairs             Wheelchair Mobility     Tilt Bed    Modified Rankin (Stroke Patients Only)       Balance Overall balance  assessment: Needs assistance Sitting-balance support: Feet supported, Single extremity supported Sitting balance-[name] Scale: Fair Sitting balance - Comments: on EOB unsupported   Standing balance  support: Single extremity supported, Reliant on assistive device for balance Standing balance-[name] Scale: Poor Standing balance comment: reliant on external support and AD.                            Communication Communication Communication: Impaired Factors Affecting Communication: Reduced clarity of speech (chronic CVA)  Cognition Arousal: Alert Behavior During Therapy: WFL for tasks assessed/performed   PT - Cognitive impairments: No family/caregiver present to determine baseline, Orientation, Initiation, Problem solving, Attention, Safety/Judgement                       PT - Cognition Comments: Pt noted to have wet bed sheets/pad and did not mention this to therapist, smell of urine in room; RN notified as no family in room to clarify if he has baseline urinary incontinence or not. Pt very motivated to work on standing/OOB mobility. Following commands: Intact Following commands impaired: Only follows one step commands consistently, Follows one step commands with increased time    Cueing Cueing Techniques: Verbal cues, Tactile cues  Exercises General Exercises - Lower Extremity Hip Flexion/Marching: AROM, Right, 15 reps, Standing (5 reps x3 sets) Other Exercises Other Exercises: reciprocal STS x 3 reps then x2 reps (from EOB and from chair surfaces) Other Exercises: forward/backward stepping and lateral steps with each leg using QC for balance for BLE strengthening and static standing ~2 mins x3 sets    General Comments General comments (skin integrity, edema, etc.): RN notified pt bed linens were damp (PTA placed in laundry and clothing smells of urine, pt needing a bath)      Pertinent Vitals/Pain Pain Assessment Pain Assessment: Faces Faces Pain Scale: Hurts little more Pain Location: R shoulder and arm with mobility/repositioning in sling Pain Descriptors / Indicators: Grimacing Pain Intervention(s): Limited activity within patient's tolerance,  Monitored during session, Repositioned    Home Living                          Prior Function            PT Goals (current goals can now be found in the care plan section) Acute Rehab PT Goals Patient Stated Goal: to walk PT Goal Formulation: With patient Time For Goal Achievement: 01/11/24 Progress towards PT goals: Progressing toward goals    Frequency    Min 2X/week      PT Plan      Co-evaluation              AM-PAC PT "6 Clicks" Mobility   Outcome Measure  Help needed turning from your back to your side while in a flat bed without using bedrails?: A Little Help needed moving from lying on your back to sitting on the side of a flat bed without using bedrails?: A Little Help needed moving to and from a bed to a chair (including a wheelchair)?: A Lot Help needed standing up from a chair using your arms (e.g., wheelchair or bedside chair)?: A Lot Help needed to walk in hospital room?: Total Help needed climbing 3-5 steps with a railing? : Total 6 Click Score: 12    End of Session         PT Visit Diagnosis: Other abnormalities of gait and mobility (R26.89);Muscle  weakness (generalized) (M62.81);History of falling (Z91.81) Hemiplegia - Right/Left: Right Hemiplegia - caused by: Unspecified     Time: 1710-1730 PT Time Calculation (min) (ACUTE ONLY): 20 min  Charges:    $Therapeutic Exercise: 8-22 mins PT General Charges $$ ACUTE PT VISIT: 1 Visit                     Joedy Eickhoff P., PTA Acute Rehabilitation Services Secure Chat Preferred 9a-5:30pm Office: [phone]    Mariel Shope [facility] 01/01/2024, 6:33 PM

## 2024-01-11 ENCOUNTER — Ambulatory Visit: Admitting: Orthopaedic Surgery

## 2024-01-20 ENCOUNTER — Emergency Department (HOSPITAL_COMMUNITY)
Admission: EM | Admit: 2024-01-20 | Discharge: 2024-01-21 | Disposition: A | Discharge status: Home / Self Care | Attending: Emergency Medicine | Admitting: Emergency Medicine

## 2024-01-20 ENCOUNTER — Emergency Department (HOSPITAL_COMMUNITY)

## 2024-01-20 ENCOUNTER — Other Ambulatory Visit: Payer: Self-pay

## 2024-01-20 DIAGNOSIS — R42 Dizziness and giddiness: Secondary | ICD-10-CM | POA: Diagnosis present

## 2024-01-20 DIAGNOSIS — R569 Unspecified convulsions: Secondary | ICD-10-CM | POA: Diagnosis not present

## 2024-01-20 DIAGNOSIS — Z7982 Long term (current) use of aspirin: Secondary | ICD-10-CM | POA: Diagnosis not present

## 2024-01-20 DIAGNOSIS — Z79899 Other long term (current) drug therapy: Secondary | ICD-10-CM | POA: Diagnosis not present

## 2024-01-20 DIAGNOSIS — I10 Essential (primary) hypertension: Secondary | ICD-10-CM | POA: Diagnosis not present

## 2024-01-20 DIAGNOSIS — Z8673 Personal history of transient ischemic attack (TIA), and cerebral infarction without residual deficits: Secondary | ICD-10-CM | POA: Diagnosis not present

## 2024-01-20 DIAGNOSIS — J449 Chronic obstructive pulmonary disease, unspecified: Secondary | ICD-10-CM | POA: Diagnosis not present

## 2024-01-20 LAB — COMPREHENSIVE METABOLIC PANEL WITH GFR
ALT: 12 U/L (ref 0–44)
AST: 22 U/L (ref 15–41)
Albumin: 3.8 g/dL (ref 3.5–5.0)
Alkaline Phosphatase: 160 U/L — ABNORMAL HIGH (ref 38–126)
Anion gap: 11 (ref 5–15)
BUN: 19 mg/dL (ref 8–23)
CO2: 23 mmol/L (ref 22–32)
Calcium: 9.7 mg/dL (ref 8.9–10.3)
Chloride: 104 mmol/L (ref 98–111)
Creatinine, Ser: 1.16 mg/dL (ref 0.61–1.24)
GFR, Estimated: >60 mL/min (ref >60)
Glucose, Bld: 95 mg/dL (ref 70–99)
Potassium: 4.8 mmol/L (ref 3.5–5.1)
Sodium: 138 mmol/L (ref 135–145)
Total Bilirubin: 0.6 mg/dL (ref 0.0–1.2)
Total Protein: 7.7 g/dL (ref 6.5–8.1)

## 2024-01-20 LAB — PHENOBARBITAL LEVEL: Phenobarbital: 14.6 ug/mL — ABNORMAL LOW (ref 15.0–40.0)

## 2024-01-20 LAB — CBC WITH DIFFERENTIAL/PLATELET
Abs Immature Granulocytes: 0.03 10*3/uL (ref 0.00–0.07)
Basophils Absolute: 0 10*3/uL (ref 0.0–0.1)
Basophils Relative: 1 %
Eosinophils Absolute: 0.2 10*3/uL (ref 0.0–0.5)
Eosinophils Relative: 3 %
HCT: 39.5 % (ref 39.0–52.0)
Hemoglobin: 12.1 g/dL — ABNORMAL LOW (ref 13.0–17.0)
Immature Granulocytes: 1 %
Lymphocytes Relative: 20 %
Lymphs Abs: 1.3 10*3/uL (ref 0.7–4.0)
MCH: 27.6 pg (ref 26.0–34.0)
MCHC: 30.6 g/dL (ref 30.0–36.0)
MCV: 90.2 fL (ref 80.0–100.0)
Monocytes Absolute: 0.6 10*3/uL (ref 0.1–1.0)
Monocytes Relative: 9 %
Neutro Abs: 4.4 10*3/uL (ref 1.7–7.7)
Neutrophils Relative %: 66 %
Platelets: 529 10*3/uL — ABNORMAL HIGH (ref 150–400)
RBC: 4.38 MIL/uL (ref 4.22–5.81)
RDW: 16.8 % — ABNORMAL HIGH (ref 11.5–15.5)
WBC: 6.5 10*3/uL (ref 4.0–10.5)
nRBC: 0 % (ref 0.0–0.2)

## 2024-01-20 MED ORDER — PHENOBARBITAL 32.4 MG PO TABS: 32.4000 mg | ORAL_TABLET | Freq: Once | ORAL | Status: DC

## 2024-01-20 MED ORDER — SODIUM CHLORIDE 0.9 % IV SOLN
200.0000 mg | Freq: Once | INTRAVENOUS | Status: AC
  Administered 2024-01-20: 200 mg via INTRAVENOUS
  Filled 2024-01-20: qty 20

## 2024-01-20 MED ORDER — PHENOBARBITAL 32.4 MG PO TABS
64.8000 mg | ORAL_TABLET | Freq: Once | ORAL | Status: AC
  Administered 2024-01-20: 64.8 mg via ORAL
  Filled 2024-01-20: qty 1
  Filled 2024-01-20: qty 2

## 2024-01-20 MED ORDER — LACOSAMIDE 100 MG PO TABS: 200.0000 mg | ORAL_TABLET | Freq: Two times a day (BID) | ORAL | Status: DC

## 2024-01-20 MED ORDER — LEVETIRACETAM 500 MG PO TABS
1000.0000 mg | ORAL_TABLET | Freq: Once | ORAL | Status: AC
  Administered 2024-01-20: 1000 mg via ORAL
  Filled 2024-01-20: qty 2

## 2024-01-20 NOTE — ED Triage Notes (Signed)
 Patient arrived with EMS from home , reports right arm tremors this evening with headache and lightheadedness , No tremors at arrival , CBG= 97.

## 2024-01-20 NOTE — Discharge Instructions (Signed)
 Your Vimpat  was increased to 200 mg twice daily.  New prescription sent into your pharmacy.  You are given your home dose of Keppra  and phenobarbital .  Return for any emergent symptoms.

## 2024-01-20 NOTE — ED Notes (Signed)
 Unable to start IV access despite multiple attempts , IV team consult ordered .

## 2024-01-20 NOTE — ED Provider Notes (Signed)
 [place] EMERGENCY DEPARTMENT AT [facility] Provider Note   CSN: [id number] Arrival date & time: 01/20/24  2041     History  Chief Complaint  Patient presents with   Right Arm Tremors    Seth [name] is a 68 y.o. male.  68 year old male with past medical history significant for CVA, seizures presents today for concern of a focal seizure he had to his right upper extremity.  This lasted 25 minutes.  He states this resolved and he is currently back to his baseline without any complaints.  He is compliant with his home medications.  Did not have his evening dose yet.  The history is provided by the patient. No language interpreter was used.       Home Medications Prior to Admission medications   Medication Sig Start Date End Date Taking? Authorizing Provider  amLODipine  (NORVASC ) 10 MG tablet Take 1 tablet (10 mg total) by mouth daily. 06/12/23   Singh, Prashant K, MD  aspirin  EC 81 MG tablet Take 1 tablet (81 mg total) by mouth daily. Swallow whole. 06/12/23   Singh, Prashant K, MD  carvedilol  (COREG ) 6.25 MG tablet Take 6.25 mg by mouth in the morning and at bedtime. 09/19/23   [provider]  folic acid  (FOLVITE ) 1 MG tablet Take 1 tablet (1 mg total) by mouth daily. 06/12/23   Singh, Prashant K, MD  gabapentin  (NEURONTIN ) 300 MG capsule Take 1 capsule (300 mg total) by mouth 3 (three) times daily. Patient taking differently: Take 300 mg by mouth 2 (two) times daily. 06/13/23 06/07/24  Cassandra Cleveland, MD  lacosamide  100 MG TABS Take 1 tablet (100 mg total) by mouth 2 (two) times daily. 01/01/24   Sheree Dieter, MD  levETIRAcetam  (KEPPRA ) 1000 MG tablet Take 2 tablets (2,000 mg total) by mouth 2 (two) times daily. 06/13/23 06/07/24  Camara, Amadou, MD  lisinopril  (ZESTRIL ) 10 MG tablet Take 1 tablet (10 mg total) by mouth daily. 11/26/23   Pahwani, Ravi, MD  omeprazole (PRILOSEC) 40 MG capsule Take 40 mg by mouth in the morning and at bedtime. 10/09/22    [provider]  PHENobarbital  (LUMINAL) 32.4 MG tablet Take 1 tablet (32.4 mg total) by mouth 2 (two) times daily. 01/01/24   Sheree Dieter, MD  rosuvastatin  (CRESTOR ) 20 MG tablet Take 1 tablet (20 mg total) by mouth daily. 01/02/24   Sheree Dieter, MD  Thiamine  HCl (B-1) 100 MG TABS Take 1 tablet by mouth every morning. 12/18/23   [provider]      Allergies    Patient has no known allergies.    Review of Systems   Review of Systems  Constitutional:  Negative for chills and fever.  Respiratory:  Negative for shortness of breath.   Neurological:  Positive for seizures.  All other systems reviewed and are negative.   Physical Exam Updated Vital Signs BP 129/76   Pulse 75   Temp 98.2 F (36.8 C) (Temporal)   Resp 18   SpO2 100%  Physical Exam Vitals and nursing note reviewed.  Constitutional:      General: He is not in acute distress.    Appearance: Normal appearance. He is not ill-appearing.  HENT:     Head: Normocephalic and atraumatic.     Nose: Nose normal.  Eyes:     Conjunctiva/sclera: Conjunctivae normal.  Cardiovascular:     Rate and Rhythm: Normal rate and regular rhythm.  Pulmonary:     Effort: Pulmonary effort is  normal. No respiratory distress.  Abdominal:     Palpations: Abdomen is soft.  Musculoskeletal:        General: No deformity. Normal range of motion.  Skin:    Findings: No rash.  Neurological:     General: No focal deficit present.     Mental Status: He is alert and oriented to person, place, and time. Mental status is at baseline.     Cranial Nerves: No cranial nerve deficit.     Motor: No weakness.     ED Results / Procedures / Treatments   Labs (all labs ordered are listed, but only abnormal results are displayed) Labs Reviewed  CBC WITH DIFFERENTIAL/PLATELET - Abnormal; Notable for the following components:      Result Value   Hemoglobin 12.1 (*)    RDW 16.8 (*)    Platelets 529 (*)    All other  components within normal limits  COMPREHENSIVE METABOLIC PANEL WITH GFR - Abnormal; Notable for the following components:   Alkaline Phosphatase 160 (*)    All other components within normal limits  PHENOBARBITAL  LEVEL - Abnormal; Notable for the following components:   Phenobarbital  14.6 (*)    All other components within normal limits  LEVETIRACETAM  LEVEL    EKG None  Radiology CT Head Wo Contrast Result Date: 01/20/2024 CLINICAL DATA:  Seizure, new-onset, no history of trauma EXAM: CT HEAD WITHOUT CONTRAST TECHNIQUE: Contiguous axial images were obtained from the base of the skull through the vertex without intravenous contrast. RADIATION DOSE REDUCTION: This exam was performed according to the departmental dose-optimization program which includes automated exposure control, adjustment of the mA and/or kV according to patient size and/or use of iterative reconstruction technique. COMPARISON:  CT head 12/27/2023 FINDINGS: Brain: Patchy and confluent areas of decreased attenuation are noted throughout the deep and periventricular white matter of the cerebral hemispheres bilaterally, compatible with chronic microvascular ischemic disease. prominence of the lateral ventricles may be related to central predominant atrophy, although a component of normal pressure/communicating hydrocephalus cannot be excluded. Ex vacuo dilatation of the left lateral ventricle. No evidence of large-territorial acute infarction. No parenchymal hemorrhage. No mass lesion. No extra-axial collection. No mass effect or midline shift. No hydrocephalus. Basilar cisterns are patent. Vascular: No hyperdense vessel. Atherosclerotic calcifications are present within the cavernous internal carotid arteries. Skull: No acute fracture or focal lesion. Sinuses/Orbits: Paranasal sinuses and mastoid air cells are clear. The orbits are unremarkable. Other: None. IMPRESSION: No acute intracranial abnormality. Electronically Signed   By:  Morgane  Naveau M.D.   On: 01/20/2024 21:47    Procedures .Critical Care  Performed by: Lucina Sabal, PA-C Authorized by: Lucina Sabal, PA-C   Critical care provider statement:    Critical care time (minutes):  30   Critical care was necessary to treat or prevent imminent or life-threatening deterioration of the following conditions: Seizure, IV Vimpat , neurology consult.   Critical care was time spent personally by me on the following activities:  Development of treatment plan with patient or surrogate, discussions with consultants, evaluation of patient's response to treatment, examination of patient, ordering and review of laboratory studies, ordering and review of radiographic studies, ordering and performing treatments and interventions, pulse oximetry, re-evaluation of patient's condition and review of old charts     Medications Ordered in ED Medications  lacosamide  (VIMPAT ) 200 mg in sodium chloride  0.9 % 25 mL IVPB (has no administration in time range)  levETIRAcetam  (KEPPRA ) tablet 1,000 mg (has no administration in time range)  PHENobarbital  (LUMINAL) tablet 32.4 mg (has no administration in time range)    ED Course/ Medical Decision Making/ A&P                                 Medical Decision Making Amount and/or Complexity of Data Reviewed Labs: ordered. Radiology: ordered.  Risk Prescription drug management.   Medical Decision Making / ED Course   This patient presents to the ED for concern of seizure, this involves an extensive number of treatment options, and is a complaint that carries with it a high risk of complications and morbidity.  The differential diagnosis includes TIA, CVA, seizure disorder, electrolyte derangement, medication noncompliance  MDM: 68 year old male presents today for concern of focal seizure to the right upper extremity.  Compliant with his home medicine.  Recently started on Vimpat  at the beginning of May.  Has not taken his evening  medicines yet.  CBC, CMP without acute concern.  Phenobarbital  level slightly low.  CT head without acute concern. EKG without acute ischemic change. normal PR level. Discussed with neurology who recommends increasing Vimpat  to 200 mg twice daily.  If PR level is normal. after this he is appropriate for discharge. Home medicines given.  Discharged in stable condition.  Return precaution discussed.  Patient voices understanding and is in agreement with the plan.   Additional history obtained: -Additional history obtained from chart review -External records from outside source obtained and reviewed including: Chart review including previous notes, labs, imaging, consultation notes   Lab Tests: -I ordered, reviewed, and interpreted labs.   The pertinent results include:   Labs Reviewed  CBC WITH DIFFERENTIAL/PLATELET - Abnormal; Notable for the following components:      Result Value   Hemoglobin 12.1 (*)    RDW 16.8 (*)    Platelets 529 (*)    All other components within normal limits  COMPREHENSIVE METABOLIC PANEL WITH GFR - Abnormal; Notable for the following components:   Alkaline Phosphatase 160 (*)    All other components within normal limits  PHENOBARBITAL  LEVEL - Abnormal; Notable for the following components:   Phenobarbital  14.6 (*)    All other components within normal limits  LEVETIRACETAM  LEVEL      EKG  EKG Interpretation Date/Time:    Ventricular Rate:    PR Interval:    QRS Duration:    QT Interval:    QTC Calculation:   R Axis:      Text Interpretation:           Imaging Studies ordered: I ordered imaging studies including CT head I independently visualized and interpreted imaging. I agree with the radiologist interpretation   Medicines ordered and prescription drug management: Meds ordered this encounter  Medications   lacosamide  (VIMPAT ) 200 mg in sodium chloride  0.9 % 25 mL IVPB   levETIRAcetam  (KEPPRA ) tablet 1,000 mg   PHENobarbital   (LUMINAL) tablet 32.4 mg    -I have reviewed the patients home medicines and have made adjustments as needed    Consultations Obtained: I requested consultation with the neurology Dr. Murvin Arthurs,  and discussed lab and imaging findings as well as pertinent plan - they recommend: As above   Cardiac Monitoring: The patient was maintained on a cardiac monitor.  I personally viewed and interpreted the cardiac monitored which showed an underlying rhythm of: NSR  Reevaluation: After the interventions noted above, I reevaluated the patient and found that they have :resolved  Co morbidities that complicate the patient evaluation  Past Medical History:  Diagnosis Date   COPD (chronic obstructive pulmonary disease) (HCC)    Depression    Enlarged prostate    Gout    Hypertension    Metabolic acidosis 07/19/2020   Seizures (HCC)    Stroke [facility])       Dispostion: Discharged in stable condition.  Return precaution discussed.  Patient voices understanding and is in agreement with plan.   Final Clinical Impression(s) / ED Diagnoses Final diagnoses:  Seizure [facility])    Rx / DC Orders ED Discharge Orders          Ordered    Lacosamide  100 MG TABS  2 times daily        01/20/24 2329              Lucina Sabal, PA-C 01/20/24 2330    Kingsley, Victoria K, DO 01/20/24 2350

## 2024-01-21 NOTE — ED Notes (Signed)
 RN spoke with patient's daughter regarding discharge transportation .

## 2024-01-22 ENCOUNTER — Ambulatory Visit: Admitting: Orthopaedic Surgery

## 2024-01-22 ENCOUNTER — Other Ambulatory Visit (HOSPITAL_COMMUNITY): Payer: Self-pay

## 2024-01-22 DIAGNOSIS — S59901A Unspecified injury of right elbow, initial encounter: Secondary | ICD-10-CM

## 2024-01-31 ENCOUNTER — Ambulatory Visit: Admitting: Neurology

## 2024-01-31 ENCOUNTER — Encounter: Payer: Self-pay | Admitting: Neurology

## 2024-01-31 VITALS — BP 123/71 | HR 64 | Resp 17 | Ht 75.0 in | Wt 159.5 lb

## 2024-01-31 DIAGNOSIS — I639 Cerebral infarction, unspecified: Secondary | ICD-10-CM

## 2024-01-31 DIAGNOSIS — D329 Benign neoplasm of meninges, unspecified: Secondary | ICD-10-CM

## 2024-01-31 DIAGNOSIS — G40209 Localization-related (focal) (partial) symptomatic epilepsy and epileptic syndromes with complex partial seizures, not intractable, without status epilepticus: Secondary | ICD-10-CM

## 2024-01-31 DIAGNOSIS — F1011 Alcohol abuse, in remission: Secondary | ICD-10-CM

## 2024-01-31 NOTE — Patient Instructions (Signed)
 Continue current medications including gabapentin  300 mg 3 times daily Lacosamide  200 mg twice daily Levetiracetam  2000 mg twice daily Phenobarbital  32.4 mg twice daily Follow-up in 6 months, if no additional seizure will likely decrease the lacosamide  to 100 mg twice daily Continue to follow with PCP Will complete [place] 2 form as patient wants to go to a nursing home Follow-up in 6 months or sooner if worse.
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
## 2024-03-14 ENCOUNTER — Other Ambulatory Visit: Payer: Self-pay

## 2024-03-14 ENCOUNTER — Emergency Department (HOSPITAL_COMMUNITY)

## 2024-03-14 ENCOUNTER — Encounter (HOSPITAL_COMMUNITY): Payer: Self-pay

## 2024-03-14 ENCOUNTER — Emergency Department (HOSPITAL_COMMUNITY)
Admission: EM | Admit: 2024-03-14 | Discharge: 2024-03-14 | Disposition: A | Discharge status: Home / Self Care | Attending: Emergency Medicine | Admitting: Emergency Medicine

## 2024-03-14 DIAGNOSIS — R569 Unspecified convulsions: Secondary | ICD-10-CM | POA: Insufficient documentation

## 2024-03-14 DIAGNOSIS — Z7982 Long term (current) use of aspirin: Secondary | ICD-10-CM | POA: Diagnosis not present

## 2024-03-14 DIAGNOSIS — F1010 Alcohol abuse, uncomplicated: Secondary | ICD-10-CM | POA: Insufficient documentation

## 2024-03-14 DIAGNOSIS — I69351 Hemiplegia and hemiparesis following cerebral infarction affecting right dominant side: Secondary | ICD-10-CM | POA: Insufficient documentation

## 2024-03-14 DIAGNOSIS — I6782 Cerebral ischemia: Secondary | ICD-10-CM | POA: Diagnosis not present

## 2024-03-14 DIAGNOSIS — G40919 Epilepsy, unspecified, intractable, without status epilepticus: Secondary | ICD-10-CM

## 2024-03-14 DIAGNOSIS — J449 Chronic obstructive pulmonary disease, unspecified: Secondary | ICD-10-CM | POA: Insufficient documentation

## 2024-03-14 DIAGNOSIS — G40909 Epilepsy, unspecified, not intractable, without status epilepticus: Secondary | ICD-10-CM | POA: Diagnosis not present

## 2024-03-14 DIAGNOSIS — Z79899 Other long term (current) drug therapy: Secondary | ICD-10-CM | POA: Insufficient documentation

## 2024-03-14 LAB — APTT: aPTT: 35 s (ref 24–36)

## 2024-03-14 LAB — PROTIME-INR
INR: 1 (ref 0.8–1.2)
Prothrombin Time: 13.7 s (ref 11.4–15.2)

## 2024-03-14 LAB — COMPREHENSIVE METABOLIC PANEL WITH GFR
ALT: 12 U/L (ref 0–44)
AST: 16 U/L (ref 15–41)
Albumin: 3.8 g/dL (ref 3.5–5.0)
Alkaline Phosphatase: 131 U/L — ABNORMAL HIGH (ref 38–126)
Anion gap: 8 (ref 5–15)
BUN: 13 mg/dL (ref 8–23)
CO2: 28 mmol/L (ref 22–32)
Calcium: 9.4 mg/dL (ref 8.9–10.3)
Chloride: 105 mmol/L (ref 98–111)
Creatinine, Ser: 0.93 mg/dL (ref 0.61–1.24)
GFR, Estimated: >60 mL/min (ref >60)
Glucose, Bld: 71 mg/dL (ref 70–99)
Potassium: 4 mmol/L (ref 3.5–5.1)
Sodium: 141 mmol/L (ref 135–145)
Total Bilirubin: 0.4 mg/dL (ref 0.0–1.2)
Total Protein: 7.5 g/dL (ref 6.5–8.1)

## 2024-03-14 LAB — CBC WITH DIFFERENTIAL/PLATELET
Abs Immature Granulocytes: 0.01 K/uL (ref 0.00–0.07)
Basophils Absolute: 0 K/uL (ref 0.0–0.1)
Basophils Relative: 0 %
Eosinophils Absolute: 0.2 K/uL (ref 0.0–0.5)
Eosinophils Relative: 3 %
HCT: 43.8 % (ref 39.0–52.0)
Hemoglobin: 13.8 g/dL (ref 13.0–17.0)
Immature Granulocytes: 0 %
Lymphocytes Relative: 29 %
Lymphs Abs: 1.9 K/uL (ref 0.7–4.0)
MCH: 27.5 pg (ref 26.0–34.0)
MCHC: 31.5 g/dL (ref 30.0–36.0)
MCV: 87.4 fL (ref 80.0–100.0)
Monocytes Absolute: 0.8 K/uL (ref 0.1–1.0)
Monocytes Relative: 12 %
Neutro Abs: 3.8 K/uL (ref 1.7–7.7)
Neutrophils Relative %: 56 %
Platelets: 253 K/uL (ref 150–400)
RBC: 5.01 MIL/uL (ref 4.22–5.81)
RDW: 15.3 % (ref 11.5–15.5)
WBC: 6.7 K/uL (ref 4.0–10.5)
nRBC: 0 % (ref 0.0–0.2)

## 2024-03-14 LAB — MAGNESIUM: Magnesium: 2 mg/dL (ref 1.7–2.4)

## 2024-03-14 MED ORDER — PHENOBARBITAL 64.8 MG PO TABS: 64.8000 mg | ORAL_TABLET | Freq: Two times a day (BID) | ORAL | 0 refills | Status: DC

## 2024-03-14 MED ORDER — PHENOBARBITAL 32.4 MG PO TABS
64.8000 mg | ORAL_TABLET | Freq: Once | ORAL | Status: AC
  Administered 2024-03-14: 64.8 mg via ORAL
  Filled 2024-03-14: qty 1
  Filled 2024-03-14: qty 2

## 2024-03-14 MED ORDER — SODIUM CHLORIDE 0.9 % IV SOLN
200.0000 mg | Freq: Once | INTRAVENOUS | Status: AC
  Administered 2024-03-14: 200 mg via INTRAVENOUS
  Filled 2024-03-14: qty 20

## 2024-03-14 MED ORDER — LEVETIRACETAM (KEPPRA) 500 MG/5 ML ADULT IV PUSH
2000.0000 mg | Freq: Once | INTRAVENOUS | Status: AC
  Administered 2024-03-14: 2000 mg via INTRAVENOUS
  Filled 2024-03-14: qty 20

## 2024-03-14 NOTE — ED Triage Notes (Signed)
 Pt BIBEMS from home came to ED c/o Seizure episode as witnessed by EMS, given Versed  IM enroute to ED

## 2024-03-14 NOTE — ED Provider Notes (Signed)
 [place] [facility] Provider Note   CSN: [id number] Arrival date & time: 03/14/24  1945   Patient presents with: Seizures   Seth [name] is a 68 y.o. male with PMHx of COPD, chronic alcohol use, GERD, PAD, prior CVA with chronic right-sided weakness, and seizure disorder on multiple AEDs who presents for evaluation after seizure-like episode at home earlier witnessed by family. Patient states he was standing in his kitchen when he suddenly felt weird and his RUE began to shake as it does with his chronic partial seizures. He endorses compliance with all of his home meds, including vimpat , keppra , and phenobarbital . He states the seizure episode with his RUE shaking lasted roughly 20 minutes and only aborted after EMS gave IM versed . Patient was awake/alert during the seizure. He feels tired but otherwise back to his baseline on initial ED evaluation. He denies any falls/LOC/head injury tonight or recently.    Prior to Admission medications   Medication Sig Start Date End Date Taking? Authorizing Provider  PHENobarbital  (LUMINAL) 64.8 MG tablet Take 1 tablet (64.8 mg total) by mouth 2 (two) times daily. 03/14/24  Yes Raoul Rake, MD  acetaminophen  (TYLENOL ) 650 MG CR tablet Take 650 mg by mouth every 8 (eight) hours as needed for pain.    [provider]  amLODipine  (NORVASC ) 10 MG tablet Take 1 tablet (10 mg total) by mouth daily. 06/12/23   Singh, Prashant K, MD  aspirin  EC 81 MG tablet Take 1 tablet (81 mg total) by mouth daily. Swallow whole. 06/12/23   Singh, Prashant K, MD  carvedilol  (COREG ) 6.25 MG tablet Take 6.25 mg by mouth in the morning and at bedtime. 09/19/23   [provider]  folic acid  (FOLVITE ) 1 MG tablet Take 1 tablet (1 mg total) by mouth daily. 06/12/23   Singh, Prashant K, MD  gabapentin  (NEURONTIN ) 300 MG capsule Take 1 capsule (300 mg total) by mouth 3 (three) times daily. Patient taking differently: Take  300 mg by mouth 2 (two) times daily. 06/13/23 06/07/24  Camara, Amadou, MD  Lacosamide  100 MG TABS Take 2 tablets (200 mg total) by mouth 2 (two) times daily. 01/20/24 02/19/24  Hildegard Loge, PA-C  levETIRAcetam  (KEPPRA ) 1000 MG tablet Take 2 tablets (2,000 mg total) by mouth 2 (two) times daily. 06/13/23 06/07/24  Camara, Amadou, MD  lisinopril  (ZESTRIL ) 10 MG tablet Take 1 tablet (10 mg total) by mouth daily. 11/26/23   Pahwani, Ravi, MD  omeprazole (PRILOSEC) 40 MG capsule Take 40 mg by mouth in the morning and at bedtime. 10/09/22   [provider]  rosuvastatin  (CRESTOR ) 20 MG tablet Take 1 tablet (20 mg total) by mouth daily. 01/02/24   Gabino Boga, MD  Thiamine  HCl (B-1) 100 MG TABS Take 1 tablet by mouth every morning. 12/18/23   [provider]    Allergies: Patient has no known allergies.     Updated Vital Signs BP (!) 159/97   Pulse 65   Temp 98 F (36.7 C) (Oral)   Resp (!) 21   Ht 6' 3 (1.905 m)   Wt 74 kg   SpO2 100%   BMI 20.39 kg/m   Physical Exam Vitals reviewed.  Constitutional:      General: He is not in acute distress.    Appearance: He is not toxic-appearing or diaphoretic.  HENT:     Head: Normocephalic and atraumatic.     Nose: Nose normal.     Mouth/Throat:  Mouth: Mucous membranes are moist.     Pharynx: Oropharynx is clear.  Eyes:     Extraocular Movements: Extraocular movements intact.     Right eye: No nystagmus.     Left eye: No nystagmus.     Conjunctiva/sclera: Conjunctivae normal.     Pupils: Pupils are equal, round, and reactive to light.  Cardiovascular:     Rate and Rhythm: Normal rate and regular rhythm.     Heart sounds: No murmur heard.    No gallop.  Pulmonary:     Effort: Pulmonary effort is normal. No respiratory distress.     Breath sounds: Normal breath sounds.  Abdominal:     General: Abdomen is flat.     Palpations: Abdomen is soft.     Tenderness: There is no abdominal tenderness. There is no  guarding.  Musculoskeletal:        General: No deformity. Normal range of motion.     Cervical back: Normal range of motion and neck supple. No rigidity or tenderness.     Right lower leg: No edema.     Left lower leg: No edema.  Skin:    General: Skin is warm and dry.     Capillary Refill: Capillary refill takes less than 2 seconds.  Neurological:     Mental Status: He is alert and oriented to person, place, and time. Mental status is at baseline.     GCS: GCS eye subscore is 4. GCS verbal subscore is 5. GCS motor subscore is 6.     Sensory: No sensory deficit.     Motor: No tremor, seizure activity or pronator drift.     Comments: baseline RUE 4/5 strength, otherwise normal strength throughout     (all labs ordered are listed, but only abnormal results are displayed) Labs Reviewed  COMPREHENSIVE METABOLIC PANEL WITH GFR - Abnormal; Notable for the following components:      Result Value   Alkaline Phosphatase 131 (*)    All other components within normal limits  CBC WITH DIFFERENTIAL/PLATELET  PROTIME-INR  APTT  MAGNESIUM     EKG: None  Radiology: CT Head Wo Contrast Result Date: 03/14/2024 EXAM: CT HEAD WITHOUT CONTRAST 03/14/2024 08:46:03 PM TECHNIQUE: CT of the head was performed without the administration of intravenous contrast. Automated exposure control, iterative reconstruction, and/or weight based adjustment of the mA/kV was utilized to reduce the radiation dose to as low as reasonably achievable. COMPARISON: None available. CLINICAL HISTORY: Breakthrough seizure. Patient brought in by EMS from home, seizure episode witnessed by EMS, given Versed  IM enroute to ED. FINDINGS: BRAIN AND VENTRICLES: Global cortical and central atrophy. Stable ventriculomegaly with a prominent temporal horn of the left lateral ventricle. Mild subcortical and periventricular small vessel ischemic changes. No acute hemorrhage. Gray-white differentiation is preserved. No extra-axial collection.  No mass effect or midline shift. ORBITS: No acute abnormality. SINUSES: No acute abnormality. SOFT TISSUES AND SKULL: No acute soft tissue abnormality. No skull fracture. VASCULATURE: Intracranial atherosclerosis. IMPRESSION: 1. No acute intracranial abnormality. 2. Stable ventriculomegaly. 3. Global atrophy with small vessel ischemic changes. Electronically signed by: Pinkie Pebbles MD 03/14/2024 08:51 PM EDT RP Workstation: [id number]   DG Chest Portable 1 View Result Date: 03/14/2024 CLINICAL DATA:  Breakthrough seizure EXAM: PORTABLE CHEST 1 VIEW COMPARISON:  12/27/2023, 02/13/2023. FINDINGS: Partially visualized hardware in the right humerus. No acute airspace disease. Pleuroparenchymal scarring at the right CP angle. Normal cardiac size with aortic atherosclerosis. IMPRESSION: No active disease. Pleuroparenchymal scarring at the right CP  angle. Electronically Signed   By: Luke Bun M.D.   On: 03/14/2024 20:24     Medications Ordered in the ED  PHENobarbital  (LUMINAL) tablet 64.8 mg (64.8 mg Oral Given 03/14/24 2124)  levETIRAcetam  (KEPPRA ) undiluted injection 2,000 mg (2,000 mg Intravenous Given 03/14/24 2054)  lacosamide  (VIMPAT ) 200 mg in sodium chloride  0.9 % 25 mL IVPB (0 mg Intravenous Stopped 03/14/24 2156)    Clinical Course as of 03/15/24 0131  Fri Mar 14, 2024  2056 DG Chest Portable 1 View No active disease. Pleuroparenchymal scarring at the right CP angle. [AD]  2056 CT Head Wo Contrast No acute intracranial abnormality. Stable ventriculomegaly. Global atrophy with small vessel ischemic changes.   [AD]  Sat Mar 15, 2024  0130 CMP and CBC both unremarkable, coags wnl [AD]    Clinical Course User Index [AD] Raoul Rake, MD    Medical Decision Making Patient with the above history including seizure disorder (complex partial RUE seizures s/p prior CVA) on multiple AEDs of which he endorses compliance is presenting with breakthrough seizure episode that occurred  at home and required IM versed  per EMS to abort after the episode lasted roughly 20 min. He has recently seen outpatient neurology last month but no major medication adjustments were made. At time of initial ED evaluation, aside from mild sleepiness, patient is at baseline with a non-focal neuro exam and no active seizure activity. Will get CT head, basic labs, and CXR to rule out underlying CVA/ICH or infection/electrolyte disturbance as possible etiology for breakthrough seizure.   Will also give patient's evening doses of his home AEDs, and per neurology consult, will give double dose of his phenobarbital  starting tonight. Neurology consult otherwise stated no indication at this time for admission and will plan to f/u closely outpatient and recommended patient increase phenobarb dose to 64.8mg  BID from now until follow up. Patient was given these medications above and had no further seizure episodes while in the ED. He was updated on results above and was discharged in stable condition.  Amount and/or Complexity of Data Reviewed Labs: ordered. Decision-making details documented in ED Course. Radiology: ordered. Decision-making details documented in ED Course.  Risk Prescription drug management.     Final diagnoses:  Breakthrough seizure [facility])    ED Discharge Orders          Ordered    PHENobarbital  (LUMINAL) 64.8 MG tablet  2 times daily        03/14/24 2138               Raoul Rake, MD 03/15/24 [id number]    Patt Alm Macho, MD 03/15/24 [phone]

## 2024-03-18 ENCOUNTER — Emergency Department (HOSPITAL_COMMUNITY)

## 2024-03-18 ENCOUNTER — Other Ambulatory Visit: Payer: Self-pay

## 2024-03-18 ENCOUNTER — Inpatient Hospital Stay (HOSPITAL_COMMUNITY)
Admission: EM | Admit: 2024-03-18 | Discharge: 2024-03-23 | DRG: 101 | Disposition: A | Discharge status: Skilled Nursing Facility | Attending: Internal Medicine | Admitting: Internal Medicine

## 2024-03-18 DIAGNOSIS — Z91148 Patient's other noncompliance with medication regimen for other reason: Secondary | ICD-10-CM

## 2024-03-18 DIAGNOSIS — R569 Unspecified convulsions: Principal | ICD-10-CM

## 2024-03-18 DIAGNOSIS — I10 Essential (primary) hypertension: Secondary | ICD-10-CM | POA: Diagnosis present

## 2024-03-18 DIAGNOSIS — Z7982 Long term (current) use of aspirin: Secondary | ICD-10-CM

## 2024-03-18 DIAGNOSIS — N400 Enlarged prostate without lower urinary tract symptoms: Secondary | ICD-10-CM | POA: Diagnosis present

## 2024-03-18 DIAGNOSIS — J449 Chronic obstructive pulmonary disease, unspecified: Secondary | ICD-10-CM | POA: Diagnosis present

## 2024-03-18 DIAGNOSIS — G40101 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, not intractable, with status epilepticus: Principal | ICD-10-CM | POA: Diagnosis present

## 2024-03-18 DIAGNOSIS — Z8249 Family history of ischemic heart disease and other diseases of the circulatory system: Secondary | ICD-10-CM | POA: Diagnosis not present

## 2024-03-18 DIAGNOSIS — Z89432 Acquired absence of left foot: Secondary | ICD-10-CM | POA: Diagnosis not present

## 2024-03-18 DIAGNOSIS — I739 Peripheral vascular disease, unspecified: Secondary | ICD-10-CM | POA: Diagnosis present

## 2024-03-18 DIAGNOSIS — Z79899 Other long term (current) drug therapy: Secondary | ICD-10-CM | POA: Diagnosis not present

## 2024-03-18 DIAGNOSIS — F17210 Nicotine dependence, cigarettes, uncomplicated: Secondary | ICD-10-CM | POA: Diagnosis present

## 2024-03-18 DIAGNOSIS — I69351 Hemiplegia and hemiparesis following cerebral infarction affecting right dominant side: Secondary | ICD-10-CM

## 2024-03-18 DIAGNOSIS — K219 Gastro-esophageal reflux disease without esophagitis: Secondary | ICD-10-CM | POA: Diagnosis present

## 2024-03-18 DIAGNOSIS — T423X6A Underdosing of barbiturates, initial encounter: Secondary | ICD-10-CM | POA: Diagnosis not present

## 2024-03-18 DIAGNOSIS — E785 Hyperlipidemia, unspecified: Secondary | ICD-10-CM | POA: Diagnosis present

## 2024-03-18 LAB — COMPREHENSIVE METABOLIC PANEL WITH GFR
ALT: 17 U/L (ref 0–44)
AST: 33 U/L (ref 15–41)
Albumin: 4.2 g/dL (ref 3.5–5.0)
Alkaline Phosphatase: 136 U/L — ABNORMAL HIGH (ref 38–126)
Anion gap: 18 — ABNORMAL HIGH (ref 5–15)
BUN: 14 mg/dL (ref 8–23)
CO2: 17 mmol/L — ABNORMAL LOW (ref 22–32)
Calcium: 10.3 mg/dL (ref 8.9–10.3)
Chloride: 99 mmol/L (ref 98–111)
Creatinine, Ser: 1.28 mg/dL — ABNORMAL HIGH (ref 0.61–1.24)
GFR, Estimated: >60 mL/min (ref >60)
Glucose, Bld: 96 mg/dL (ref 70–99)
Potassium: 4.7 mmol/L (ref 3.5–5.1)
Sodium: 134 mmol/L — ABNORMAL LOW (ref 135–145)
Total Bilirubin: 0.8 mg/dL (ref 0.0–1.2)
Total Protein: 8.1 g/dL (ref 6.5–8.1)

## 2024-03-18 LAB — CBC WITH DIFFERENTIAL/PLATELET
Abs Immature Granulocytes: 0.03 K/uL (ref 0.00–0.07)
Basophils Absolute: 0 K/uL (ref 0.0–0.1)
Basophils Relative: 0 %
Eosinophils Absolute: 0.3 K/uL (ref 0.0–0.5)
Eosinophils Relative: 3 %
HCT: 46 % (ref 39.0–52.0)
Hemoglobin: 14.9 g/dL (ref 13.0–17.0)
Immature Granulocytes: 0 %
Lymphocytes Relative: 31 %
Lymphs Abs: 2.8 K/uL (ref 0.7–4.0)
MCH: 28.2 pg (ref 26.0–34.0)
MCHC: 32.4 g/dL (ref 30.0–36.0)
MCV: 87 fL (ref 80.0–100.0)
Monocytes Absolute: 1 K/uL (ref 0.1–1.0)
Monocytes Relative: 11 %
Neutro Abs: 4.9 K/uL (ref 1.7–7.7)
Neutrophils Relative %: 55 %
Platelets: 262 K/uL (ref 150–400)
RBC: 5.29 MIL/uL (ref 4.22–5.81)
RDW: 15 % (ref 11.5–15.5)
Smear Review: ADEQUATE
WBC: 9 K/uL (ref 4.0–10.5)
nRBC: 0 % (ref 0.0–0.2)

## 2024-03-18 LAB — PHENOBARBITAL LEVEL: Phenobarbital: 5.3 ug/mL — ABNORMAL LOW (ref 15.0–40.0)

## 2024-03-18 MED ORDER — SODIUM CHLORIDE 0.9 % IV SOLN
200.0000 mg | Freq: Once | INTRAVENOUS | Status: AC
  Administered 2024-03-18: 200 mg via INTRAVENOUS
  Filled 2024-03-18: qty 20

## 2024-03-18 MED ORDER — SODIUM CHLORIDE 0.9 % IV SOLN
1000.0000 mg | Freq: Once | INTRAVENOUS | Status: AC
  Administered 2024-03-18: 1000 mg via INTRAVENOUS
  Filled 2024-03-18: qty 7.69

## 2024-03-18 MED ORDER — PHENOBARBITAL 32.4 MG PO TABS
32.4000 mg | ORAL_TABLET | Freq: Once | ORAL | Status: AC
  Administered 2024-03-18: 32.4 mg via ORAL
  Filled 2024-03-18: qty 1

## 2024-03-18 MED ORDER — GABAPENTIN 300 MG PO CAPS
300.0000 mg | ORAL_CAPSULE | Freq: Three times a day (TID) | ORAL | Status: DC
  Administered 2024-03-18 – 2024-03-23 (×16): 300 mg via ORAL
  Filled 2024-03-18 (×16): qty 1

## 2024-03-18 MED ORDER — MIDAZOLAM HCL 2 MG/2ML IJ SOLN
4.0000 mg | Freq: Once | INTRAMUSCULAR | Status: AC
  Administered 2024-03-18: 4 mg via INTRAVENOUS
  Filled 2024-03-18: qty 4

## 2024-03-18 MED ORDER — ENOXAPARIN SODIUM 40 MG/0.4ML IJ SOSY
40.0000 mg | PREFILLED_SYRINGE | INTRAMUSCULAR | Status: DC
  Administered 2024-03-18 – 2024-03-22 (×5): 40 mg via SUBCUTANEOUS
  Filled 2024-03-18 (×5): qty 0.4

## 2024-03-18 MED ORDER — MIDAZOLAM HCL 2 MG/2ML IJ SOLN
2.0000 mg | Freq: Once | INTRAMUSCULAR | Status: AC
  Administered 2024-03-18: 2 mg via INTRAVENOUS
  Filled 2024-03-18: qty 2

## 2024-03-18 MED ORDER — CARVEDILOL 6.25 MG PO TABS
6.2500 mg | ORAL_TABLET | Freq: Two times a day (BID) | ORAL | Status: DC
  Administered 2024-03-18 – 2024-03-23 (×11): 6.25 mg via ORAL
  Filled 2024-03-18 (×4): qty 1
  Filled 2024-03-18: qty 2
  Filled 2024-03-18 (×6): qty 1

## 2024-03-18 MED ORDER — ROSUVASTATIN CALCIUM 20 MG PO TABS
20.0000 mg | ORAL_TABLET | Freq: Every day | ORAL | Status: DC
  Administered 2024-03-18 – 2024-03-23 (×6): 20 mg via ORAL
  Filled 2024-03-18 (×6): qty 1

## 2024-03-18 MED ORDER — LACOSAMIDE 200 MG PO TABS
200.0000 mg | ORAL_TABLET | Freq: Two times a day (BID) | ORAL | Status: DC
  Administered 2024-03-18 – 2024-03-23 (×10): 200 mg via ORAL
  Filled 2024-03-18 (×10): qty 1

## 2024-03-18 MED ORDER — LEVETIRACETAM 750 MG PO TABS
2000.0000 mg | ORAL_TABLET | Freq: Two times a day (BID) | ORAL | Status: DC
  Administered 2024-03-18 – 2024-03-23 (×10): 2000 mg via ORAL
  Filled 2024-03-18 (×10): qty 1

## 2024-03-18 MED ORDER — PHENOBARBITAL 32.4 MG PO TABS
32.4000 mg | ORAL_TABLET | Freq: Two times a day (BID) | ORAL | Status: DC
  Administered 2024-03-18 – 2024-03-23 (×10): 32.4 mg via ORAL
  Filled 2024-03-18 (×10): qty 1

## 2024-03-18 MED ORDER — LEVETIRACETAM (KEPPRA) 500 MG/5 ML ADULT IV PUSH
3000.0000 mg | Freq: Once | INTRAVENOUS | Status: AC
  Administered 2024-03-18: 3000 mg via INTRAVENOUS
  Filled 2024-03-18: qty 30

## 2024-03-18 MED ORDER — ASPIRIN 81 MG PO TBEC
81.0000 mg | DELAYED_RELEASE_TABLET | Freq: Every day | ORAL | Status: DC
  Administered 2024-03-18 – 2024-03-23 (×6): 81 mg via ORAL
  Filled 2024-03-18 (×6): qty 1

## 2024-03-18 MED ORDER — PANTOPRAZOLE SODIUM 40 MG PO TBEC
40.0000 mg | DELAYED_RELEASE_TABLET | Freq: Every day | ORAL | Status: DC
  Administered 2024-03-18 – 2024-03-23 (×6): 40 mg via ORAL
  Filled 2024-03-18 (×6): qty 1

## 2024-03-18 NOTE — ED Provider Notes (Signed)
 [place] [facility] AT [facility] Provider Note   CSN: [id number] Arrival date & time: 03/18/24  0505     History Chief Complaint  Patient presents with   Seizures    HPI Seth [name] is a 68 y.o. male presenting for intractable seizures.  States that he started having full body shaking approximately 3 hours prior to arrival.  Tried his home medications.  He thinks he has been compliant on his home medications but is not certain states he does not know what he takes.  Denies fevers chills nausea vomiting shortness of breath.  The shaking woke him up from sleep this evening..   Patient's recorded medical, surgical, social, medication list and allergies were reviewed in the Snapshot window as part of the initial history.   Review of Systems   Review of Systems  Constitutional:  Negative for chills and fever.  HENT:  Negative for ear pain and sore throat.   Eyes:  Negative for pain and visual disturbance.  Respiratory:  Negative for cough and shortness of breath.   Cardiovascular:  Negative for chest pain and palpitations.  Gastrointestinal:  Negative for abdominal pain and vomiting.  Genitourinary:  Negative for dysuria and hematuria.  Musculoskeletal:  Negative for arthralgias and back pain.  Skin:  Negative for color change and rash.  Neurological:  Positive for seizures. Negative for syncope.  All other systems reviewed and are negative.   Physical Exam Updated Vital Signs BP (!) 182/140   Pulse (!) 136   Temp 98.4 F (36.9 C) (Oral)   Resp (!) 25   Ht 6' 3 (1.905 m)   Wt 79.4 kg   SpO2 95%   BMI 21.87 kg/m  Physical Exam Vitals and nursing note reviewed.  Constitutional:      General: He is not in acute distress.    Appearance: He is well-developed.  HENT:     Head: Normocephalic and atraumatic.  Eyes:     Conjunctiva/sclera: Conjunctivae normal.  Cardiovascular:     Rate and Rhythm: Normal rate and regular rhythm.     Heart  sounds: No murmur heard. Pulmonary:     Effort: Pulmonary effort is normal. No respiratory distress.     Breath sounds: Normal breath sounds.  Abdominal:     Palpations: Abdomen is soft.     Tenderness: There is no abdominal tenderness.  Musculoskeletal:        General: No swelling.     Cervical back: Neck supple.  Skin:    General: Skin is warm and dry.     Capillary Refill: Capillary refill takes less than 2 seconds.  Neurological:     General: No focal deficit present.     Mental Status: He is alert.  Psychiatric:        Mood and Affect: Mood normal.      ED Course/ Medical Decision Making/ A&P    Procedures .Critical Care  Performed by: Jerral Meth, MD Authorized by: Jerral Meth, MD   Critical care provider statement:    Critical care time (minutes):  30   Critical care was necessary to treat or prevent imminent or life-threatening deterioration of the following conditions:  CNS failure or compromise   Critical care was time spent personally by me on the following activities:  Development of treatment plan with patient or surrogate, discussions with consultants, evaluation of patient's response to treatment, examination of patient, ordering and review of laboratory studies, ordering and review of radiographic studies,  ordering and performing treatments and interventions, pulse oximetry, re-evaluation of patient's condition and review of old charts    Medications Ordered in ED Medications  PHENObarbital  (LUMINAL) 1,000 mg in sodium chloride  0.9 % 100 mL IVPB (has no administration in time range)  midazolam  (VERSED ) injection 2 mg (2 mg Intravenous Given 03/18/24 0518)  levETIRAcetam  (KEPPRA ) undiluted injection 3,000 mg (3,000 mg Intravenous Given 03/18/24 0523)  midazolam  (VERSED ) injection 4 mg (4 mg Intravenous Given 03/18/24 0609)    Medical Decision Making:    68 year old male presenting with intractable seizures.  Treated with Versed  emergently due to  ongoing concern for focal status epilepticus.  No response.  Consulted neurology who recommended a levetiracetam  load as well as checking his phenobarb level.  Basic lab work performed as well to evaluate for metabolic crisis. Reassessment: Lab work revealed he is subtherapeutic on his phenobarbital .  No other metabolic crisis Will elevate Versed  dose to 4 mg IV and add on a phenobarbital  IV load per recommendations from neurology.  Neurology coming to bedside to evaluate Reassessment: Neurology has recommended admission to medicine for loading of all of his home meds, ongoing care and management.  They reviewed outside [facility] neurology records where patient had been recommended to proceed to skilled nursing/assisted living care.  Patient states he has been unable to pursue this of his own accord. 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  Will admit to medicine for ongoing care management.   Clinical Impression:  1. Seizure (HCC)      Data Unavailable   Final Clinical Impression(s) / ED Diagnoses Final diagnoses:  Seizure [facility])    Rx / DC Orders ED Discharge Orders     None         Jerral Meth, MD 03/18/24 [phone]

## 2024-03-18 NOTE — Plan of Care (Signed)
 Eating food. Assisted with meals.  Daughter in to visit. No seizure activity.   Problem: Education: Goal: Knowledge of General Education information will improve Description: Including pain rating scale, medication(s)/side effects and non-pharmacologic comfort measures Outcome: Progressing   Problem: Activity: Goal: Risk for activity intolerance will decrease Outcome: Progressing   Problem: Nutrition: Goal: Adequate nutrition will be maintained Outcome: Progressing   Problem: Safety: Goal: Ability to remain free from injury will improve Outcome: Progressing

## 2024-03-18 NOTE — Progress Notes (Signed)
EEG complete. Results pending.  ?
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
## 2024-03-18 NOTE — Procedures (Signed)
 Patient Name: Seth [name]  MRN: [id number]  Epilepsy Attending: Arlin MALVA Krebs  Referring Physician/Provider: Voncile Isles, MD  Date: 03/18/2024 Duration: 22.06 mins  Patient history: 68yo M with breakthrough seizure. EEG to evaluate for seizure  Level of alertness: Awake, asleep  AEDs during EEG study: LEV, GBP, LCM, Phenobarb, Versed   Technical aspects: This EEG study was done with scalp electrodes positioned according to the 10-20 International system of electrode placement. Electrical activity was reviewed with band pass filter of 1-70Hz , sensitivity of 7 uV/mm, display speed of 72mm/sec with a 60Hz  notched filter applied as appropriate. EEG data were recorded continuously and digitally stored.  Video monitoring was available and reviewed as appropriate.  Description: The posterior dominant rhythm consists of 8-9 Hz activity of moderate voltage (25-35 uV) seen predominantly in posterior head regions, symmetric and reactive to eye opening and eye closing. Sleep was characterized by vertex waves, sleep spindles (12 to 14 Hz), maximal frontocentral region.  EEG showed continuous 3 to 6 Hz theta-delta slowing in left hemisphere. Sharp waves were noted in left posterior quadrant.  Hyperventilation and photic stimulation were not performed.     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  Additionally there is cortical dysfunction in left hemisphere likely secondary to underlying structural abnormality, postictal state.  No seizures were seen throughout the recording.  Kabrina Christiano O Rogerick Baldwin
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
## 2024-03-18 NOTE — ED Notes (Signed)
 Pt unable to give urine sample at this time, urinated in brief. Told to alert staff if he needs to go again

## 2024-03-18 NOTE — ED Notes (Signed)
 Pt O2 sat in the 80s. [place] MD notified. Applied 2L O2 with improvement. Continue with 4mg  dose of Versed  per MD.

## 2024-03-18 NOTE — ED Notes (Signed)
 Pt's mother called and asked if we could make sure nothing happens to the money that her son brought to the hospital. Pt has an envelope of money tucked into his sock. Engineer, production to inventory. Vernell RN counted 3600 dollars, security bagged, seal and took into their possession. Maggie, mother of patient was called and notified.

## 2024-03-18 NOTE — ED Notes (Signed)
 Cleaned pt, and applied a clean brief

## 2024-03-18 NOTE — ED Triage Notes (Addendum)
 Pt BIB EMS for seizure like activity per family. Pt reports having an episode where his body continuously jerks x3 mins and he can't control it but is able to respond through the entire episode. Upon EMS arrival pt was out of the episode, a&ox4 w/ no signs of seizures. In route, EMS reports that pts started to tremor again. VS stable, A&Ox4. Hx of seizures, compliant w/ medication per pt.

## 2024-03-19 DIAGNOSIS — R569 Unspecified convulsions: Secondary | ICD-10-CM | POA: Diagnosis not present

## 2024-03-19 DIAGNOSIS — Z91148 Patient's other noncompliance with medication regimen for other reason: Secondary | ICD-10-CM | POA: Diagnosis not present

## 2024-03-19 DIAGNOSIS — T423X6A Underdosing of barbiturates, initial encounter: Secondary | ICD-10-CM | POA: Diagnosis not present

## 2024-03-19 LAB — URINALYSIS, ROUTINE W REFLEX MICROSCOPIC
Bilirubin Urine: NEGATIVE
Glucose, UA: NEGATIVE mg/dL
Hgb urine dipstick: NEGATIVE
Ketones, ur: NEGATIVE mg/dL
Leukocytes,Ua: NEGATIVE
Nitrite: NEGATIVE
Protein, ur: NEGATIVE mg/dL
Specific Gravity, Urine: 1.009 (ref 1.005–1.030)
pH: 6 (ref 5.0–8.0)

## 2024-03-19 LAB — BASIC METABOLIC PANEL WITH GFR
Anion gap: 15 (ref 5–15)
BUN: 12 mg/dL (ref 8–23)
CO2: 18 mmol/L — ABNORMAL LOW (ref 22–32)
Calcium: 9 mg/dL (ref 8.9–10.3)
Chloride: 102 mmol/L (ref 98–111)
Creatinine, Ser: 1 mg/dL (ref 0.61–1.24)
GFR, Estimated: >60 mL/min (ref >60)
Glucose, Bld: 66 mg/dL — ABNORMAL LOW (ref 70–99)
Potassium: 4.2 mmol/L (ref 3.5–5.1)
Sodium: 135 mmol/L (ref 135–145)

## 2024-03-19 LAB — RAPID URINE DRUG SCREEN, HOSP PERFORMED
Amphetamines: NOT DETECTED
Barbiturates: POSITIVE — AB
Benzodiazepines: POSITIVE — AB
Cocaine: NOT DETECTED
Opiates: NOT DETECTED
Tetrahydrocannabinol: NOT DETECTED

## 2024-03-19 NOTE — Progress Notes (Signed)
 Progress Note    [name]  FMW:[id number] DOB: 1956-01-19  DOA: 03/18/2024 PCP: [name], NP      Brief Narrative:    Medical records reviewed and are as summarized below:  Seth [name] is a 68 y.o. 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  He presented to the hospital on 03/18/2024 with breakthrough seizures. He was given up to 6 mg of IV Versed  in the ED for seizures.  He was admitted to the hospital for breakthrough seizures. Phenobarbital  level was subtherapeutic.   Assessment/Plan:   Principal Problem:   Seizure (HCC)    Body mass index is 21.87 kg/m.   Breakthrough seizures in the setting of suspected medical nonadherence: Continue phenobarbital , gabapentin , Vimpat  and Keppra . The importance of medical adherence was reiterated. He has been evaluated by the neurologist.   No evidence of infection was found. 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  No seizures were seen throughout the recording. Phenobarbital  level was only 5.3 which is subtherapeutic. Phenobarbital  level was 14.6 on 01/20/2024.   History of stroke with right-sided hemiparesis: PT and OT evaluation.  May need SNF at discharge   Comorbidities include hypertension, hyperlipidemia, GERD   Diet Order             Diet regular Room service appropriate? Yes with Assist; Fluid consistency: Thin  Diet effective now                            Consultants: Neurologist  Procedures: None    Medications:    aspirin  EC  81 mg Oral Daily   carvedilol   6.25 mg Oral BID WC   enoxaparin  (LOVENOX ) injection  40 mg Subcutaneous Q24H   gabapentin   300 mg Oral TID   lacosamide   200 mg Oral  BID   levETIRAcetam   2,000 mg Oral BID   pantoprazole   40 mg Oral Daily   phenobarbital   32.4 mg Oral BID   rosuvastatin   20 mg Oral Daily   Continuous Infusions:   Anti-infectives (From admission, onward)    None              Family Communication/Anticipated D/C date and plan/Code Status   DVT prophylaxis: enoxaparin  (LOVENOX ) injection 40 mg Start: 03/18/24 2200 SCDs Start: 03/18/24 1337     Code Status: Full Code  Family Communication: None Disposition Plan: May need SNF at discharge   Status is: Inpatient Remains inpatient appropriate because: Seizure       Subjective:   Interval events noted.  He feels better today.  Objective:    Vitals:   03/19/24 0407 03/19/24 0803 03/19/24 1100 03/19/24 1523  BP: (!) 148/94 118/77 (!) 129/91 (!) 142/93  Pulse: 65 64 63 (!) 57  Resp: 12 18 16 18   Temp: 97.7 F (36.5 C) 97.9 F (36.6 C) 98.2 F (36.8 C) (!) 97.5 F (36.4 C)  TempSrc: Oral Temporal Oral Oral  SpO2: 96% 100% 100% 100%  Weight:      Height:       No data found.   Intake/Output Summary (Last 24 hours) at 03/19/2024 1530 Last data filed at 03/19/2024 1101 Gross per 24 hour  Intake 360 ml  Output  800 ml  Net -440 ml   Filed Weights   03/18/24 0510  Weight: 79.4 kg    Exam:  GEN: NAD SKIN: Warm and dry dry EYES: No pallor or icterus ENT: MMM CV: RRR PULM: CTA B ABD: soft, ND, NT, +BS CNS: AAO x 3, right hemiparesis with right hand contracture EXT: No edema or tenderness        Data Reviewed:   I have personally reviewed following labs and imaging studies:  Labs: Labs show the following:   Basic Metabolic Panel: Recent Labs  Lab 03/14/24 2014 03/18/24 0517  NA 141 134*  K 4.0 4.7  CL 105 99  CO2 28 17*  GLUCOSE 71 96  BUN 13 14  CREATININE 0.93 1.28*  CALCIUM  9.4 10.3  MG 2.0  --    GFR Estimated Creatinine Clearance: 63.8 mL/min (A) (by C-G formula based on SCr of 1.28 mg/dL (H)). Liver Function  Tests: Recent Labs  Lab 03/14/24 2014 03/18/24 0517  AST 16 33  ALT 12 17  ALKPHOS 131* 136*  BILITOT 0.4 0.8  PROT 7.5 8.1  ALBUMIN  3.8 4.2   No results for input(s): LIPASE, AMYLASE in the last 168 hours. No results for input(s): AMMONIA in the last 168 hours. Coagulation profile Recent Labs  Lab 03/14/24 2014  INR 1.0    CBC: Recent Labs  Lab 03/14/24 2014 03/18/24 0517  WBC 6.7 9.0  NEUTROABS 3.8 4.9  HGB 13.8 14.9  HCT 43.8 46.0  MCV 87.4 87.0  PLT 253 262   Cardiac Enzymes: No results for input(s): CKTOTAL, CKMB, CKMBINDEX, TROPONINI in the last 168 hours. BNP (last 3 results) No results for input(s): PROBNP in the last 8760 hours. CBG: No results for input(s): GLUCAP in the last 168 hours. D-Dimer: No results for input(s): DDIMER in the last 72 hours. Hgb A1c: No results for input(s): HGBA1C in the last 72 hours. Lipid Profile: No results for input(s): CHOL, HDL, LDLCALC, TRIG, CHOLHDL, LDLDIRECT in the last 72 hours. Thyroid  function studies: No results for input(s): TSH, T4TOTAL, T3FREE, THYROIDAB in the last 72 hours.  Invalid input(s): FREET3 Anemia work up: No results for input(s): VITAMINB12, FOLATE, FERRITIN, TIBC, IRON, RETICCTPCT in the last 72 hours. Sepsis Labs: Recent Labs  Lab 03/14/24 2014 03/18/24 0517  WBC 6.7 9.0    Microbiology No results found for this or any previous visit (from the past 240 hours).  Procedures and diagnostic studies:  DG CHEST PORT 1 VIEW Result Date: 03/18/2024 CLINICAL DATA:  Dyspnea on exertion EXAM: PORTABLE CHEST 1 VIEW COMPARISON:  None Available. FINDINGS: Normal mediastinum and cardiac silhouette. Normal pulmonary vasculature. No evidence of effusion, infiltrate, or pneumothorax. No acute bony abnormality. IMPRESSION: No acute cardiopulmonary process. Electronically Signed   By: Jackquline Boxer M.D.   On: 03/18/2024 09:43   EEG adult Result  Date: 03/18/2024 Shelton Arlin KIDD, MD     03/18/2024  8:48 AM Patient Name: Seth [name] MRN: [id number] Epilepsy Attending: Arlin KIDD Shelton Referring Physician/Provider: Voncile Isles, MD Date: 03/18/2024 Duration: 22.06 mins Patient history: 68yo M with breakthrough seizure. EEG to evaluate for seizure Level of alertness: Awake, asleep AEDs during EEG study: LEV, GBP, LCM, Phenobarb, Versed  Technical aspects: This EEG study was done with scalp electrodes positioned according to the 10-20 International system of electrode placement. Electrical activity was reviewed with band pass filter of 1-70Hz , sensitivity of 7 uV/mm, display speed of 20mm/sec with a 60Hz  notched filter applied as appropriate. EEG data  were recorded continuously and digitally stored.  Video monitoring was available and reviewed as appropriate. Description: The posterior dominant rhythm consists of 8-9 Hz activity of moderate voltage (25-35 uV) seen predominantly in posterior head regions, symmetric and reactive to eye opening and eye closing. Sleep was characterized by vertex waves, sleep spindles (12 to 14 Hz), maximal frontocentral region.  EEG showed continuous 3 to 6 Hz theta-delta slowing in left hemisphere. Sharp waves were noted in left posterior quadrant.  Hyperventilation and photic stimulation were not performed.   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  Additionally there is cortical dysfunction in left hemisphere likely secondary to underlying structural abnormality, postictal state.  No seizures were seen throughout the recording. Priyanka MALVA Krebs   CT HEAD WO CONTRAST ( ) Result Date: 03/18/2024 EXAM: CT HEAD WITHOUT CONTRAST 03/18/2024 07:01:07 AM TECHNIQUE: CT of the head was performed without the administration of intravenous contrast. Automated exposure  control, iterative reconstruction, and/or weight based adjustment of the mA/kV was utilized to reduce the radiation dose to as low as reasonably achievable. COMPARISON: CT head without contrast 03/14/2024. CLINICAL HISTORY: Seizure disorder, clinical change. FINDINGS: BRAIN AND VENTRICLES: Chronic encephalomalacia is present in the left hemisphere with loss of white matter volume and expected dilation of the left lateral ventricle. Diffuse white matter changes are stable, advanced for age. No acute infarct, hemorrhage or mass lesion is present. Remote lacunar infarcts are again noted in the cerebellum bilaterally. ORBITS: No acute abnormality. SINUSES: No acute abnormality. SOFT TISSUES AND SKULL: Right supraorbital scalp soft tissue swelling is present without underlying fracture or foreign body. VASCULATURE: Atherosclerotic calcifications are present in the cavernous carotid arteries bilaterally. No hyperdense vessel is present. IMPRESSION: 1. No acute intracranial abnormality. 2. 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 3. Stable, advanced for age, diffuse white matter changes. 4. Remote lacunar infarcts in the cerebellum bilaterally. 5. Atherosclerotic calcifications in the cavernous carotid arteries bilaterally. 6. Right supraorbital scalp soft tissue swelling without underlying fracture or foreign body. Electronically signed by: Lonni Necessary MD 03/18/2024 07:13 AM EDT RP Workstation: [id number]               LOS: 1 day   Dmya Long  [facility]   Pager on [email or web address]. If 7PM-7AM, please contact night-coverage at [email or web address]     03/19/2024, 3:30 PM

## 2024-03-19 NOTE — Plan of Care (Addendum)
 Pt had no active seizure activity as of this time. Pt trying to move, lift up and down his right  leg, but still he can't move it straight as previously assessed. Had snacks, assisted him and RN were able to collect urine sample. Problem: Health Behavior/Discharge Planning: Goal: Ability to manage health-related needs will improve Outcome: Progressing   Problem: Clinical Measurements: Goal: Will remain free from infection Outcome: Progressing   Problem: Activity: Goal: Risk for activity intolerance will decrease Outcome: Progressing   Problem: Nutrition: Goal: Adequate nutrition will be maintained Outcome: Progressing   Problem: Pain Managment: Goal: General experience of comfort will improve and/or be controlled Outcome: Progressing   Problem: Safety: Goal: Ability to remain free from injury will improve Outcome: Progressing   Problem: Skin Integrity: Goal: Risk for impaired skin integrity will decrease Outcome: Progressing

## 2024-03-19 NOTE — TOC Initial Note (Signed)
 Transition of Care [facility]) - Initial/Assessment Note    Patient Details  Name: Seth [name] MRN: [id number] Date of Birth: [birth year]  Transition of Care [facility]) CM/SW Contact:    Andrez JULIANNA George, RN Phone Number: 03/19/2024, 2:15 PM  Clinical Narrative:                  Pt is from home with his sister. He also spends time at daughters home. Plan was for him to admit to [facility] but ended up in hospital. Pt hopes to still be able to dc to [place].  Daughter was providing needed transportation and managing his medications. Pt was active with [name] services.  IP Care Management following.  Expected Discharge Plan: Assisted Living Barriers to Discharge: Continued Medical Work up   Patient Goals and CMS Choice            Expected Discharge Plan and Services In-house Referral: Clinical Social Work Discharge Planning Services: CM Consult Post Acute Care Choice: Long Term Acute Care (LTAC) Living arrangements for the past 2 months: Single Family Home                                      Prior Living Arrangements/Services Living arrangements for the past 2 months: Single Family Home Lives with:: Siblings Patient language and need for interpreter reviewed:: Yes Do you feel safe going back to the place where you live?: Yes          Current home services: DME ([facility]) Criminal Activity/Legal Involvement Pertinent to Current Situation/Hospitalization: No - Comment as needed  Activities of Daily Living      Permission Sought/Granted                  Emotional Assessment Appearance:: Appears stated age Attitude/Demeanor/Rapport: Engaged Affect (typically observed): Accepting Orientation: : Oriented to Self, Oriented to Place, Oriented to  Time, Oriented to Situation   Psych Involvement: No (comment)  Admission diagnosis:  Seizure [facility]) [R56.9] Patient Active Problem List   Diagnosis Date Noted   Closed displaced comminuted fracture of shaft  of right humerus 12/28/2023   Cerebrovascular accident (CVA) (HCC) 12/28/2023   Generalized weakness 11/22/2023   General weakness 11/22/2023   Hyperlipidemia 12/20/2022   GERD (gastroesophageal reflux disease) 12/20/2022   Alcohol withdrawal syndrome without complication (HCC) 12/20/2022   Seizure (HCC) 11/29/2022   Seizure disorder (HCC) 10/18/2022   Asymptomatic bacteriuria 05/24/2022   HAP (hospital-acquired pneumonia) 05/23/2022   Multiple closed fractures of ribs of right side    Fall during current hospitalization 10/16/2021   Peripheral arterial disease (HCC)    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, not intractable, with status epilepticus (HCC)    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HCC)    Seizures (HCC) 05/17/2021   Pressure injury of skin 03/14/2021   Essential hypertension    Chronic alcohol use 01/01/2021   History of CVA with residual RUE weakness 01/01/2021   COVID-19 virus infection 09/14/2020   Acute osteomyelitis of left first metatarsal stump    AKI (acute kidney injury) (HCC) 07/20/2020   Hypoglycemia 07/20/2020   Right sided weakness 02/19/2019   Hyperkalemia 02/19/2019   Left fibular fracture 02/19/2019   Alcohol use disorder 02/19/2019   Malnutrition of moderate degree 08/25/2017   Tobacco use disorder 08/23/2017   COPD (chronic obstructive pulmonary disease) (HCC) 08/23/2017   PCP:  [name], NP Pharmacy:   [facility] - [place], [place] - [address] [place] [address] Phone: [phone] Fax: [phone]  [facility] [address] [place] [address] Phone: [phone] Fax: [phone]     Social Drivers of Health (SDOH) Social History: SDOH Screenings   Food Insecurity: No Food Insecurity (03/19/2024)  Housing: Low Risk  (03/19/2024)  Transportation Needs: No Transportation Needs  (03/19/2024)  Utilities: Not At Risk (03/19/2024)  Depression (PHQ2-9): Low Risk  (08/23/2020)  Financial Resource Strain: Medium Risk (12/07/2017)  Social Connections: Moderately Isolated (03/19/2024)  Stress: Stress Concern Present (12/07/2017)  Tobacco Use: High Risk (03/14/2024)   SDOH Interventions:     Readmission Risk Interventions    12/22/2022    4:22 PM  Readmission Risk Prevention Plan  Transportation Screening Complete  HRI or Home Care Consult Complete  Social Work Consult for Recovery Care Planning/Counseling Complete  Palliative Care Screening Not Applicable  Medication Review [facility]) Complete

## 2024-03-19 NOTE — Plan of Care (Signed)
   Problem: Education: Goal: Knowledge of General Education information will improve Description: Including pain rating scale, medication(s)/side effects and non-pharmacologic comfort measures Outcome: Progressing   Problem: Activity: Goal: Risk for activity intolerance will decrease Outcome: Progressing   Problem: Nutrition: Goal: Adequate nutrition will be maintained Outcome: Progressing

## 2024-03-19 NOTE — Progress Notes (Signed)
 NEUROLOGY CONSULT FOLLOW UP NOTE   Date of service: March 19, 2024 Patient Name: Seth [name] MRN:  [id number] DOB:  [birth year]  Interval Hx/subjective   Patient sitting up in bed, oriented and conversant, good attention.  Continued right-sided weakness, patient states it is the same as usual, after stroke.   Vitals   Vitals:   03/18/24 1937 03/18/24 2337 03/19/24 0407 03/19/24 0803  BP: 137/72 129/78 (!) 148/94 118/77  Pulse: 63 63 65 64  Resp: 12 12 12 18   Temp: 98.2 F (36.8 C) 97.8 F (36.6 C) 97.7 F (36.5 C)   TempSrc: Oral Oral Oral   SpO2: 100% 100% 96% 100%  Weight:      Height:         Body mass index is 21.87 kg/m.  Physical Exam   Constitutional: Appears well-developed, NAD Cardiovascular: Normal rate and regular rhythm.  Respiratory: Effort normal, non-labored breathing. Room air.   Neurologic Examination   Mental Status: Patient is awake, alert, oriented to person, place, month, year, and situation. Patient is able to give a clear and coherent history. Good attention.  No signs of aphasia or neglect. Some repetition in conversation, forgetfulness.  Cranial Nerves: II to XII grossly intact Motor: Tone is normal. Bulk is normal.  Dense right hemiplegia present (previous left frontal CVA--patient states this is his usual)  RUE: 2/5, only able to lift slightly off bed, 3/5 grip  RLE: 1/5, unable to lift off bed, 3/5 d/p flexion LUE and LLE 5/5 Sensory: Sensation is decreased to right arm. (previous left frontal CVA--patient states this is his usual) Cerebellar: Unable to perform FNF or HKS on right due to weakness.    Medications  Current Facility-Administered Medications:    aspirin  EC tablet 81 mg, 81 mg, Oral, Daily, Georgina Basket, MD, 81 mg at 03/18/24 9081   carvedilol  (COREG ) tablet 6.25 mg, 6.25 mg, Oral, BID WC, Georgina Basket, MD, 6.25 mg at 03/18/24 1756   enoxaparin  (LOVENOX ) injection 40 mg, 40 mg, Subcutaneous, Q24H, Georgina Basket,  MD, 40 mg at 03/18/24 2130   gabapentin  (NEURONTIN ) capsule 300 mg, 300 mg, Oral, TID, Arora, Ashish, MD, 300 mg at 03/18/24 2129   lacosamide  (VIMPAT ) tablet 200 mg, 200 mg, Oral, BID, Arora, Ashish, MD, 200 mg at 03/18/24 2129   levETIRAcetam  (KEPPRA ) tablet 2,000 mg, 2,000 mg, Oral, BID, Arora, Ashish, MD, 2,000 mg at 03/18/24 2130   pantoprazole  (PROTONIX ) EC tablet 40 mg, 40 mg, Oral, Daily, Georgina Basket, MD, 40 mg at 03/18/24 9082   PHENobarbital  (LUMINAL) tablet 32.4 mg, 32.4 mg, Oral, BID, Arora, Ashish, MD, 32.4 mg at 03/18/24 2130   rosuvastatin  (CRESTOR ) tablet 20 mg, 20 mg, Oral, Daily, Georgina Basket, MD, 20 mg at 03/18/24 0917  Labs and Diagnostic Imaging   CBC:  Recent Labs  Lab 03/14/24 2014 03/18/24 0517  WBC 6.7 9.0  NEUTROABS 3.8 4.9  HGB 13.8 14.9  HCT 43.8 46.0  MCV 87.4 87.0  PLT 253 262    Basic Metabolic Panel:  Lab Results  Component Value Date   NA 134 (L) 03/18/2024   K 4.7 03/18/2024   CO2 17 (L) 03/18/2024   GLUCOSE 96 03/18/2024   BUN 14 03/18/2024   CREATININE 1.28 (H) 03/18/2024   CALCIUM  10.3 03/18/2024   GFRNONAA >60 03/18/2024   GFRAA >60 06/03/2019   Lipid Panel:  Lab Results  Component Value Date   LDLCALC 63 10/13/2021   HgbA1c:  Lab Results  Component Value Date  HGBA1C 5.0 05/18/2021   Urine Drug Screen:     Component Value Date/Time   LABOPIA NONE DETECTED 03/19/2024 0343   COCAINSCRNUR NONE DETECTED 03/19/2024 0343   LABBENZ POSITIVE (A) 03/19/2024 0343   AMPHETMU NONE DETECTED 03/19/2024 0343   THCU NONE DETECTED 03/19/2024 0343   LABBARB POSITIVE (A) 03/19/2024 0343    Alcohol Level     Component Value Date/Time   ETH <15 12/28/2023 1312   INR  Lab Results  Component Value Date   INR 1.0 03/14/2024   APTT  Lab Results  Component Value Date   APTT 35 03/14/2024   AED levels:  Lab Results  Component Value Date   LAMOTRIGINE  <1.0 (L) 01/31/2023   LEVETIRACETA 119.0 (H) 12/28/2023    CT Head without  contrast( Personally reviewed): 03/14/2024-no acute changes   7/22 rEEG:  ABNORMALITY: - Sharp wave, left posterior quadrant  - Continuous slow,  left hemisphere  IMPRESSION: This study is consistent with patient's 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  Additionally there is cortical dysfunction in left hemisphere likely secondary to underlying structural abnormality, postictal state.  No seizures were seen throughout the recording.   Assessment   Seth [name] is a 68 y.o. male with a PMHx including prior subdural hemorrhage, substance abuse, seizures on multiple medications, who presented with breakthrough seizures. Phenobarbital  levels subtherapeutic. Received a total of 6 mg of Versed  in the ED to abate the seizures. Medication compliance unclear, but with sub-therapeutic phenobarbital , noncompliance is highly likely. OP visit in June noted that going to a nursing home was discussed. Patient states that his daughter has been working on getting him admitted to [facility] and was completing paperwork, he thought that it was mostly finished--discussed with SW.    Impression: Breakthrough seizure secondary to medication noncompliance   Recommendations  Seizure Precautions Continue Keppra  2000 mg BID Continue phenobarbital  32.4 mg BID Continue home gabapentin  300 mg TID Continue Vimpat  200 mg BID PT/OT Consult Medication compliance education SW assistance regarding discharge to SNF possibility, patient states daughter has been working on getting him admitted to [place].  Follow-up with Dr. Gregg, established outpatient neurologist.  Neurology will sign off. Please re-consult with any additional needs. Thank you for this consult.  ______________________________________________________________________   Signed, Rocky JAYSON Likes, NP [facility]   Electronically signed: Dr. Angelys Yetman

## 2024-03-20 ENCOUNTER — Encounter (HOSPITAL_COMMUNITY): Payer: Self-pay | Admitting: Hospitalist

## 2024-03-20 DIAGNOSIS — R569 Unspecified convulsions: Secondary | ICD-10-CM | POA: Diagnosis not present

## 2024-03-20 LAB — GLUCOSE, CAPILLARY
Glucose-Capillary: 83 mg/dL (ref 70–99)
Glucose-Capillary: 112 mg/dL — ABNORMAL HIGH (ref 70–99)
Glucose-Capillary: 69 mg/dL — ABNORMAL LOW (ref 70–99)
Glucose-Capillary: 109 mg/dL — ABNORMAL HIGH (ref 70–99)

## 2024-03-20 MED ORDER — ENSURE PLUS HIGH PROTEIN PO LIQD
237.0000 mL | Freq: Three times a day (TID) | ORAL | Status: DC
  Administered 2024-03-21 – 2024-03-23 (×3): 237 mL via ORAL

## 2024-03-20 NOTE — Evaluation (Signed)
 Physical Therapy Evaluation Patient Details Name: Seth [name] MRN: [id number] DOB: 1955/09/08 Today's Date: 03/20/2024  History of Present Illness  68 y.o. male [facility] to [facility]/18/24 with focal status epilepticus. EEG consistent with focal epilepsy from L posterior quadrant and cortical dysfunction in L hemisphere. 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 PTA, pt would have family close by when standing and ambulating with use of L hemiwalker. In today's session, pt was able to stand with MinA for steadying assist and slight boost-up. Able to take a few steps with ModA for x1 LOB and use of quad cane. After eval, MD confirmed that pt is RUE WBAT and can mobilize with RW. Anticipate pt will progress well with more UE support. Pt and family were planning to transition to [facility] prior to hospital admit. Recommendation for <3hrs post acute rehab to continue progressing mobility. Pt would benefit from acute skilled PT with current functional limitations listed below (see PT Problem List). Acute PT to follow.  BP 141/77, 76 BPM         If plan is discharge home, recommend the following: A lot of help with walking and/or transfers;A lot of help with bathing/dressing/bathroom;Assist for transportation;Help with stairs or ramp for entrance   Can travel by private vehicle   No    Equipment Recommendations None recommended by PT     Functional Status Assessment Patient has had a recent decline in their functional status and demonstrates the ability to make significant improvements in function in a reasonable and predictable amount of time.     Precautions / Restrictions Precautions Precautions: Fall Precaution/Restrictions Comments: seizure Restrictions Weight Bearing Restrictions Per  Provider Order: Yes RUE Weight Bearing Per Provider Order: Weight bearing as tolerated Other Position/Activity Restrictions: slowly progress WBAT with RW. MD note 01/28/24      Mobility  Bed Mobility Overal bed mobility: Needs Assistance Bed Mobility: Supine to Sit    Supine to sit: Contact guard, Used rails, HOB elevated    General bed mobility comments: Rolling to L side    Transfers Overall transfer level: Needs assistance Equipment used: None Transfers: Sit to/from Stand Sit to Stand: Min assist    General transfer comment: Min A to boost into standing pt using LUE to push up from EOB and RUE supported by therapist.    Ambulation/Gait Ambulation/Gait assistance: Mod assist Gait Distance (Feet): 6 Feet Assistive device: Quad cane Gait Pattern/deviations: Shuffle, Staggering left, Staggering right, Trunk flexed, Decreased step length - right, Decreased step length - left Gait velocity: decr     General Gait Details: unsteady with pt staggering right/left, ModA to correct x1 LOB with close chair follow    Balance Overall balance assessment: Needs assistance Sitting-balance support: No upper extremity supported, Feet supported Sitting balance-[name] Scale: Fair     Standing balance support: Reliant on assistive device for balance, Single extremity supported Standing balance-[name] Scale: Poor Standing balance comment: ext support and min to mod A       Pertinent Vitals/Pain Pain Assessment Pain Assessment: No/denies pain    Home Living Family/patient expects to be discharged to:: Skilled nursing facility (plan for [facility]) Living Arrangements: Parent;Other relatives (mother and sister) Available Help at Discharge: Family;Available 24 hours/day Type of Home: House Home Access: Stairs to enter Entrance Stairs-Rails: Can reach both;Left;Right Entrance Stairs-Number of  Steps: 3 Alternate Level Stairs-Number of Steps: pt's room is down ~4-5 steps (B rails) to  basement Home Layout: Two level;Able to live on main level with bedroom/bathroom Home Equipment: Grab bars - tub/shower;Grab bars - toilet;Rolling Bence (2 wheels);Cane - single point;Shower seat;Other (comment) (L hemi Dombek)      Prior Function Prior Level of Function : Needs assist      Mobility Comments: was using L hemiwalker with family close by to stand and walk ADLs Comments: ind with ADLs     Extremity/Trunk Assessment   Upper Extremity Assessment Upper Extremity Assessment: Defer to OT evaluation RUE Deficits / Details: awaiting further orders from MD to determine RUE restrictions RUE: Unable to fully assess due to immobilization    Lower Extremity Assessment Lower Extremity Assessment: RLE deficits/detail;LLE deficits/detail RLE Deficits / Details: grossly 4+/5, limited R ankle AROM RLE Sensation: WNL LLE Deficits / Details: prior L transmetatarsal amp LLE Sensation: WNL    Cervical / Trunk Assessment Cervical / Trunk Assessment: Normal  Communication   Communication Communication: No apparent difficulties    Cognition Arousal: Alert Behavior During Therapy: WFL for tasks assessed/performed   PT - Cognitive impairments: No apparent impairments      Following commands: Intact       Cueing Cueing Techniques: Verbal cues      PT Assessment Patient needs continued PT services  PT Problem List Decreased strength;Decreased range of motion;Decreased activity tolerance;Decreased balance;Decreased mobility       PT Treatment Interventions DME instruction;Gait training;Functional mobility training;Therapeutic activities;Therapeutic exercise;Balance training;Neuromuscular re-education;Patient/family education;Stair training    PT Goals (Current goals can be found in the Care Plan section)  Acute Rehab PT Goals Patient Stated Goal: to get more rehab PT Goal Formulation: With patient Time For Goal Achievement: 04/03/24 Potential to Achieve Goals: Good     Frequency Min 2X/week        AM-PAC PT 6 Clicks Mobility  Outcome Measure Help needed turning from your back to your side while in a flat bed without using bedrails?: A Little Help needed moving from lying on your back to sitting on the side of a flat bed without using bedrails?: A Little Help needed moving to and from a bed to a chair (including a wheelchair)?: A Lot Help needed standing up from a chair using your arms (e.g., wheelchair or bedside chair)?: A Little Help needed to walk in hospital room?: A Lot Help needed climbing 3-5 steps with a railing? : Total 6 Click Score: 14    End of Session Equipment Utilized During Treatment: Gait belt Activity Tolerance: Patient tolerated treatment well Patient left: in chair;with call bell/phone within reach;with chair alarm set Nurse Communication: Mobility status PT Visit Diagnosis: Unsteadiness on feet (R26.81);Other abnormalities of gait and mobility (R26.89);Muscle weakness (generalized) (M62.81)    Time: 1030-1053 PT Time Calculation (min) (ACUTE ONLY): 23 min   Charges:   PT Evaluation $PT Eval Low Complexity: 1 Low   PT General Charges $$ ACUTE PT VISIT: 1 Visit       Kate ORN, PT, DPT Secure Chat Preferred  Rehab Office [phone]   Kate BRAVO Wendolyn 03/20/2024, 1:41 PM

## 2024-03-20 NOTE — Plan of Care (Signed)
  Problem: Health Behavior/Discharge Planning: Goal: Ability to manage health-related needs will improve Outcome: Progressing   Problem: Clinical Measurements: Goal: Will remain free from infection Outcome: Progressing   Problem: Nutrition: Goal: Adequate nutrition will be maintained Outcome: Progressing   Problem: Safety: Goal: Ability to remain free from injury will improve Outcome: Progressing   Problem: Skin Integrity: Goal: Risk for impaired skin integrity will decrease Outcome: Progressing

## 2024-03-20 NOTE — Progress Notes (Signed)
 Progress Note    Seth [name]  FMW:[id number] DOB: August 03, 1956  DOA: 03/18/2024 PCP: [name], NP      Brief Narrative:    Medical records reviewed and are as summarized below:  Seth [name] is a 68 y.o. 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  He presented to the hospital on 03/18/2024 with breakthrough seizures. He was given up to 6 mg of IV Versed  in the ED for seizures.  He was admitted to the hospital for breakthrough seizures. Phenobarbital  level was subtherapeutic.   Assessment/Plan:   Principal Problem:   Seizure (HCC)    Body mass index is 21.87 kg/m.   Breakthrough seizures in the setting of suspected medical nonadherence: Continue phenobarbital , gabapentin , Vimpat  and Keppra . The importance of medical adherence was reiterated. He has been evaluated by the neurologist.   No evidence of infection was found. 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  No seizures were seen throughout the recording. Phenobarbital  level was only 5.3 which is subtherapeutic. Phenobarbital  level was 14.6 on 01/20/2024.   History of stroke with right-sided hemiparesis: PT and OT evaluation.  May need SNF at discharge   History of ORIF of right humeral shaft fracture on 12/29/2023.Follow-up visit with Dr. Dale Most, orthopedic surgeon on 01/28/2024.  His note said that okay to slowly begin weightbearing as tolerated using Smejkal.  PT and OT orders have been updated.    Comorbidities include hypertension, hyperlipidemia, GERD   Diet Order             Diet regular Room service appropriate? Yes with Assist; Fluid consistency:  Thin  Diet effective now                            Consultants: Neurologist  Procedures: None    Medications:    aspirin  EC  81 mg Oral Daily   carvedilol   6.25 mg Oral BID WC   enoxaparin  (LOVENOX ) injection  40 mg Subcutaneous Q24H   gabapentin   300 mg Oral TID   lacosamide   200 mg Oral BID   levETIRAcetam   2,000 mg Oral BID   pantoprazole   40 mg Oral Daily   phenobarbital   32.4 mg Oral BID   rosuvastatin   20 mg Oral Daily   Continuous Infusions:   Anti-infectives (From admission, onward)    None              Family Communication/Anticipated D/C date and plan/Code Status   DVT prophylaxis: enoxaparin  (LOVENOX ) injection 40 mg Start: 03/18/24 2200 SCDs Start: 03/18/24 1337     Code Status: Full Code  Family Communication: None Disposition Plan: May need SNF at discharge   Status is: Inpatient Remains inpatient appropriate because: Seizure       Subjective:   No acute events overnight.  He has no complaints.  He is waiting to go to rehab.  Objective:    Vitals:   03/19/24 2035 03/19/24 2332 03/20/24 0432 03/20/24 0748  BP: (!) 146/89 130/76 126/82 (!) 142/94  Pulse: 69 68 64 68  Resp: 18 16 15 17   Temp: 98.1 F (36.7 C) 98.5 F (36.9 C) 97.8 F (36.6  C) 97.9 F (36.6 C)  TempSrc: Oral Oral Oral Oral  SpO2: 99% 98% 99% 100%  Weight:      Height:       No data found.   Intake/Output Summary (Last 24 hours) at 03/20/2024 1115 Last data filed at 03/20/2024 1000 Gross per 24 hour  Intake 340 ml  Output 600 ml  Net -260 ml   Filed Weights   03/18/24 0510  Weight: 79.4 kg    Exam:   GEN: NAD SKIN: Warm and dry EYES: No pallor or icterus ENT: MMM CV: RRR PULM: CTA B ABD: soft, ND, NT, +BS CNS: AAO x 3, right hemiparesis with right hand contracture EXT: No edema or tenderness      Data Reviewed:   I have personally reviewed following labs and imaging studies:  Labs: Labs show the following:    Basic Metabolic Panel: Recent Labs  Lab 03/14/24 2014 03/18/24 0517 03/19/24 1612  NA 141 134* 135  K 4.0 4.7 4.2  CL 105 99 102  CO2 28 17* 18*  GLUCOSE 71 96 66*  BUN 13 14 12   CREATININE 0.93 1.28* 1.00  CALCIUM  9.4 10.3 9.0  MG 2.0  --   --    GFR Estimated Creatinine Clearance: 81.6 mL/min (by C-G formula based on SCr of 1 mg/dL). Liver Function Tests: Recent Labs  Lab 03/14/24 2014 03/18/24 0517  AST 16 33  ALT 12 17  ALKPHOS 131* 136*  BILITOT 0.4 0.8  PROT 7.5 8.1  ALBUMIN  3.8 4.2   No results for input(s): LIPASE, AMYLASE in the last 168 hours. No results for input(s): AMMONIA in the last 168 hours. Coagulation profile Recent Labs  Lab 03/14/24 2014  INR 1.0    CBC: Recent Labs  Lab 03/14/24 2014 03/18/24 0517  WBC 6.7 9.0  NEUTROABS 3.8 4.9  HGB 13.8 14.9  HCT 43.8 46.0  MCV 87.4 87.0  PLT 253 262   Cardiac Enzymes: No results for input(s): CKTOTAL, CKMB, CKMBINDEX, TROPONINI in the last 168 hours. BNP (last 3 results) No results for input(s): PROBNP in the last 8760 hours. CBG: Recent Labs  Lab 03/20/24 0827  GLUCAP 83   D-Dimer: No results for input(s): DDIMER in the last 72 hours. Hgb A1c: No results for input(s): HGBA1C in the last 72 hours. Lipid Profile: No results for input(s): CHOL, HDL, LDLCALC, TRIG, CHOLHDL, LDLDIRECT in the last 72 hours. Thyroid  function studies: No results for input(s): TSH, T4TOTAL, T3FREE, THYROIDAB in the last 72 hours.  Invalid input(s): FREET3 Anemia work up: No results for input(s): VITAMINB12, FOLATE, FERRITIN, TIBC, IRON, RETICCTPCT in the last 72 hours. Sepsis Labs: Recent Labs  Lab 03/14/24 2014 03/18/24 0517  WBC 6.7 9.0    Microbiology No results found for this or any previous visit (from the past 240 hours).  Procedures and diagnostic studies:  No results found.              LOS: 2 days   Altonio Schwertner  [facility]   Pager on [email or web address]. If 7PM-7AM, please contact night-coverage at [email or web address]     03/20/2024, 11:15 AM

## 2024-03-20 NOTE — Plan of Care (Signed)
   Problem: Education: Goal: Knowledge of General Education information will improve Description: Including pain rating scale, medication(s)/side effects and non-pharmacologic comfort measures Outcome: Progressing   Problem: Activity: Goal: Risk for activity intolerance will decrease Outcome: Progressing   Problem: Nutrition: Goal: Adequate nutrition will be maintained Outcome: Progressing

## 2024-03-20 NOTE — Evaluation (Signed)
 Occupational Therapy Evaluation Patient Details Name: Seth [name] MRN: [id number] DOB: 08/09/1956 Today's Date: 03/20/2024   History of Present Illness   68 y.o. male presents to [facility] 03/18/24 with focal status epilepticus. EEG consistent with focal epilepsy from L posterior quadrant and cortical dysfunction in L hemisphere. 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 PTA he reports being ind with hemi [name] and his ADLs/iADLs.  Pt currently presenting with impaired balance, needing min A to mod A + Quad cane to transfer. His LUE remains strong, awaiting further clarification on RUE restrictions. Pt keeps his RUE tucked at 90 deg flexion as if in sling, needs a fair bit of ADL assist. Pt would benefit from continue acute skilled OT services to progress closer to baseline. Patient will benefit from continued inpatient follow up therapy, <3 hours/day      If plan is discharge home, recommend the following:   A little help with walking and/or transfers;A little help with bathing/dressing/bathroom;Assistance with cooking/housework;Assist for transportation     Functional Status Assessment   Patient has had a recent decline in their functional status and demonstrates the ability to make significant improvements in function in a reasonable and predictable amount of time.     Equipment Recommendations   None recommended by OT     Recommendations for Other Services         Precautions/Restrictions   Precautions Precautions: Fall Precaution/Restrictions Comments: seizure Restrictions Weight Bearing Restrictions Per Provider Order: Yes RUE Weight Bearing Per Provider Order: Weight bearing as tolerated Other Position/Activity Restrictions: slowly progress  WBAT with RW. MD note 01/28/24     Mobility Bed Mobility Overal bed mobility: Needs Assistance Bed Mobility: Supine to Sit     Supine to sit: Contact guard, Used rails, HOB elevated     General bed mobility comments: Rolling to L side    Transfers Overall transfer level: Needs assistance Equipment used: None Transfers: Sit to/from Stand Sit to Stand: Min assist           General transfer comment: Min A to boost into standing pt using LUE to push up from EOB and RUE supported by therapist.      Balance Overall balance assessment: Needs assistance Sitting-balance support: No upper extremity supported, Feet supported Sitting balance-[name] Scale: Fair     Standing balance support: Reliant on assistive device for balance, Single extremity supported Standing balance-[name] Scale: Poor Standing balance comment: ext support and min to mod A                           ADL either performed or assessed with clinical judgement   ADL Overall ADL's : Needs assistance/impaired Eating/Feeding: Set up;Bed level   Grooming: Wash/dry face;Sitting;Set up Grooming Details (indicate cue type and reason): using LUE Upper Body Bathing: Sitting;Minimal assistance   Lower Body Bathing: Sitting/lateral leans;Minimal assistance   Upper Body Dressing : Sitting;Moderate assistance   Lower Body Dressing: Sitting/lateral leans;Minimal assistance   Toilet Transfer: Minimal assistance;Moderate assistance;Stand-pivot (+ QC) Toilet Transfer Details (indicate cue type and reason): min to mod A, LOB needing mod A to recover during pivot transfer Toileting- Clothing Manipulation and Hygiene: Sit to/from stand;Moderate assistance       Functional mobility during ADLs: Moderate assistance (  min to mod A to maintain balance with QC)       Vision Patient Visual Report: No change from baseline Additional Comments: no formally assessed     Perception Perception: Not tested        Praxis Praxis: WFL       Pertinent Vitals/Pain Pain Assessment Pain Assessment: Faces Faces Pain Scale: No hurt     Extremity/Trunk Assessment Upper Extremity Assessment Upper Extremity Assessment: RUE deficits/detail (LUE WFL strength, ROM) RUE Deficits / Details: awaiting further orders from MD to determine RUE restrictions RUE: Unable to fully assess due to immobilization   Lower Extremity Assessment Lower Extremity Assessment: Defer to PT evaluation       Communication Communication Communication: No apparent difficulties   Cognition Arousal: Alert Behavior During Therapy: WFL for tasks assessed/performed Cognition: No family/caregiver present to determine baseline             OT - Cognition Comments: follows simple commands well.                 Following commands: Intact       Cueing  General Comments   Cueing Techniques: Verbal cues      Exercises     Shoulder Instructions      Home Living Family/patient expects to be discharged to:: Skilled nursing facility (plan is for [facility]) Living Arrangements: Parent;Other relatives (stays with mother and sister) Available Help at Discharge: Family;Available 24 hours/day Type of Home: House Home Access: Stairs to enter [employer] of Steps: 3 Entrance Stairs-Rails: Can reach both;Left;Right Home Layout: Two level;Able to live on main level with bedroom/bathroom     Bathroom Shower/Tub: Tub/shower unit   Bathroom Toilet: Standard     Home Equipment: Grab bars - tub/shower;Grab bars - toilet;Rolling [place] (2 wheels);Cane - single point;Shower seat;Other (comment) (L hemi Azzara.)          Prior Functioning/Environment               Mobility Comments: ambulates with RW vs quad cane ADLs Comments: ind with ADLs, reports being ind with hemi Elting.    OT Problem List: Impaired balance (sitting and/or standing);Impaired UE functional use   OT Treatment/Interventions:  Therapeutic exercise;Self-care/ADL training;Patient/family education;Balance training;Therapeutic activities;DME and/or AE instruction      OT Goals(Current goals can be found in the care plan section)   Acute Rehab OT Goals Patient Stated Goal: To go to rehab OT Goal Formulation: With patient Time For Goal Achievement: 04/03/24 Potential to Achieve Goals: Good   OT Frequency:  Min 2X/week    Co-evaluation              AM-PAC OT 6 Clicks Daily Activity     Outcome Measure Help from another person eating meals?: A Little Help from another person taking care of personal grooming?: A Little Help from another person toileting, which includes using toliet, bedpan, or urinal?: A Lot Help from another person bathing (including washing, rinsing, drying)?: A Little Help from another person to put on and taking off regular upper body clothing?: A Lot Help from another person to put on and taking off regular lower body clothing?: A Little 6 Click Score: 16   End of Session Equipment Utilized During Treatment: Gait belt;Other (comment) (quad cane) Nurse Communication: Mobility status  Activity Tolerance: Patient tolerated treatment well Patient left: in chair;with call bell/phone within reach;with chair alarm set;with SCD's reapplied  OT Visit Diagnosis: Unsteadiness on feet (R26.81);Other abnormalities of gait and mobility (R26.89)  Time: 1030-1053 OT Time Calculation (min): 23 min Charges:  OT General Charges $OT Visit: 1 Visit OT Evaluation $OT Eval Low Complexity: 1 Low  03/20/2024  AB, OTR/L  Acute Rehabilitation Services  Office: [phone]   Curtistine JONETTA Das 03/20/2024, 11:50 AM

## 2024-03-21 DIAGNOSIS — R569 Unspecified convulsions: Secondary | ICD-10-CM | POA: Diagnosis not present

## 2024-03-21 LAB — GLUCOSE, CAPILLARY
Glucose-Capillary: 78 mg/dL (ref 70–99)
Glucose-Capillary: 70 mg/dL (ref 70–99)

## 2024-03-21 NOTE — Care Management Important Message (Signed)
 Important Message  Patient Details  Name: Seth [name] MRN: [id number] Date of Birth: [birth year]   Important Message Given:  Yes - Medicare IM     Claretta Deed 03/21/2024, 4:13 PM

## 2024-03-21 NOTE — TOC Progression Note (Addendum)
 Transition of Care [facility]) - Progression Note    Patient Details  Name: Seth [name] MRN: [id number] Date of Birth: 12-07-1955  Transition of Care [facility]) CM/SW Contact  Lendia Dais, [place] Phone Number: 03/21/2024, 11:28 AM  Clinical Narrative:  CSW spoke to patient about PT recs for a SNF. The patient was agreeable and informed the CSW of preferences of [facility] and [facility]. CSW has sent out a referral in the HUB.   46 - CSW informed patient of bed offers and the patient was agreeable to [name]. CSW received permission from patient to contact daughter Brian [phone]. CSW spoke to the daughter Brian and she was agreeable to the discharge plan.  1548 - CSW has started insurance authorization.    Expected Discharge Plan: Skilled Nursing Facility Barriers to Discharge: Continued Medical Work up, English as a second language teacher               Expected Discharge Plan and Services In-house Referral: Clinical Social Work Discharge Planning Services: [employer] Consult Post Acute Care Choice: Long Term Acute Care (LTAC) Living arrangements for the past 2 months: Single Family Home                                       Social Drivers of Health (SDOH) Interventions SDOH Screenings   Food Insecurity: No Food Insecurity (03/19/2024)  Housing: Low Risk  (03/19/2024)  Transportation Needs: No Transportation Needs (03/19/2024)  Utilities: Not At Risk (03/19/2024)  Depression (PHQ2-9): Low Risk  (08/23/2020)  Financial Resource Strain: Medium Risk (12/07/2017)  Social Connections: Moderately Isolated (03/19/2024)  Stress: Stress Concern Present (12/07/2017)  Tobacco Use: High Risk (03/20/2024)    Readmission Risk Interventions    12/22/2022    4:22 PM  Readmission Risk Prevention Plan  Transportation Screening Complete  HRI or Home Care Consult Complete  Social Work Consult for Recovery Care Planning/Counseling Complete  Palliative Care Screening Not Applicable   Medication Review [employer]) Complete

## 2024-03-21 NOTE — Progress Notes (Signed)
 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  Patient was admitted with seizures.  On presentation, phenobarbital  level was subtherapeutic.   Assessment/Plan:   Principal Problem:   Seizure (HCC)    Body mass index is 21.87 kg/m.   Breakthrough seizures: - In the setting of suspected medical nonadherence: Continue phenobarbital , gabapentin , Vimpat  and Keppra . The importance of medical adherence was reiterated. He has been evaluated by the neurologist.   No evidence of infection was found. 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  No seizures were seen throughout the recording. Phenobarbital  level was only 5.3 which is subtherapeutic. Phenobarbital  level was 14.6 on 01/20/2024. 03/21/2024: No further seizures.  Pursue disposition.   History of stroke with right-sided hemiparesis: PT and OT evaluation.  May need SNF at discharge   History of ORIF of right humeral shaft fracture on 12/29/2023.Follow-up visit with Dr. Dale Most, orthopedic surgeon on 01/28/2024.  His note said that okay to slowly begin weightbearing as tolerated using [place].  PT and OT orders have been updated.    Comorbidities include hypertension, hyperlipidemia, GERD   Diet Order             Diet regular Room service appropriate? Yes with Assist; Fluid consistency: Thin  Diet effective now                  Consultants: Neurologist  Procedures: None    Medications:    aspirin  EC  81 mg Oral Daily   carvedilol   6.25 mg Oral BID WC   enoxaparin  (LOVENOX ) injection  40 mg  Subcutaneous Q24H   feeding supplement  237 mL Oral TID BM   gabapentin   300 mg Oral TID   lacosamide   200 mg Oral BID   levETIRAcetam   2,000 mg Oral BID   pantoprazole   40 mg Oral Daily   phenobarbital   32.4 mg Oral BID   rosuvastatin   20 mg Oral Daily   Continuous Infusions:   Anti-infectives (From admission, onward)    None       Family Communication/Anticipated D/C date and plan/Code Status   DVT prophylaxis: enoxaparin  (LOVENOX ) injection 40 mg Start: 03/18/24 2200 SCDs Start: 03/18/24 1337     Code Status: Full Code  Family Communication: None Disposition Plan: SNF at discharge   Status is: Inpatient Remains inpatient appropriate because: Seizure   Subjective:   No acute events overnight.  He has no complaints.  He is waiting to go to rehab.  Objective:    Vitals:   03/21/24 0259 03/21/24 0720 03/21/24 1100 03/21/24 1507  BP: 116/75 132/68 100/71 123/74  Pulse: 72 71 71 72  Resp: 18 18 20 20   Temp: 98.6 F (37 C) 98 F (36.7 C) 98.5 F (36.9 C) 98.2 F (36.8 C)  TempSrc: Oral Oral Oral Oral  SpO2: 97% 100% 99% 100%  Weight:      Height:       No data found.   Intake/Output Summary (Last 24 hours) at 03/21/2024 1531 Last data filed at 03/21/2024 1505 Gross per 24 hour  Intake 540 ml  Output 750 ml  Net -  210 ml   Filed Weights   03/18/24 0510  Weight: 79.4 kg    Exam:   GEN: NAD SKIN: Warm and dry EYES: No pallor or icterus ENT: MMM CV: RRR PULM: CTA B ABD: soft, ND, NT, +BS CNS: AAO x 3, right hemiparesis with right hand contracture EXT: No edema or tenderness      Data Reviewed:   I have personally reviewed following labs and imaging studies:  Labs: Labs show the following:   Basic Metabolic Panel: Recent Labs  Lab 03/14/24 2014 03/18/24 0517 03/19/24 1612  NA 141 134* 135  K 4.0 4.7 4.2  CL 105 99 102  CO2 28 17* 18*  GLUCOSE 71 96 66*  BUN 13 14 12   CREATININE 0.93 1.28* 1.00  CALCIUM  9.4 10.3 9.0  MG  2.0  --   --    GFR Estimated Creatinine Clearance: 81.6 mL/min (by C-G formula based on SCr of 1 mg/dL). Liver Function Tests: Recent Labs  Lab 03/14/24 2014 03/18/24 0517  AST 16 33  ALT 12 17  ALKPHOS 131* 136*  BILITOT 0.4 0.8  PROT 7.5 8.1  ALBUMIN  3.8 4.2   No results for input(s): LIPASE, AMYLASE in the last 168 hours. No results for input(s): AMMONIA in the last 168 hours. Coagulation profile Recent Labs  Lab 03/14/24 2014  INR 1.0    CBC: Recent Labs  Lab 03/14/24 2014 03/18/24 0517  WBC 6.7 9.0  NEUTROABS 3.8 4.9  HGB 13.8 14.9  HCT 43.8 46.0  MCV 87.4 87.0  PLT 253 262   Cardiac Enzymes: No results for input(s): CKTOTAL, CKMB, CKMBINDEX, TROPONINI in the last 168 hours. BNP (last 3 results) No results for input(s): PROBNP in the last 8760 hours. CBG: Recent Labs  Lab 03/20/24 0827 03/20/24 1153 03/20/24 1604 03/20/24 2126 03/21/24 0637  GLUCAP 83 112* 69* 109* 78   D-Dimer: No results for input(s): DDIMER in the last 72 hours. Hgb A1c: No results for input(s): HGBA1C in the last 72 hours. Lipid Profile: No results for input(s): CHOL, HDL, LDLCALC, TRIG, CHOLHDL, LDLDIRECT in the last 72 hours. Thyroid  function studies: No results for input(s): TSH, T4TOTAL, T3FREE, THYROIDAB in the last 72 hours.  Invalid input(s): FREET3 Anemia work up: No results for input(s): VITAMINB12, FOLATE, FERRITIN, TIBC, IRON, RETICCTPCT in the last 72 hours. Sepsis Labs: Recent Labs  Lab 03/14/24 2014 03/18/24 0517  WBC 6.7 9.0    Microbiology No results found for this or any previous visit (from the past 240 hours).  Procedures and diagnostic studies:  No results found.              LOS: 3 days   Leatrice LILLETTE Chapel  [facility]   Pager on [email or web address]. If 7PM-7AM, please contact night-coverage at [email or web address]     03/21/2024, 3:31 PM

## 2024-03-21 NOTE — [place] FL2 (Signed)
 [place]  MEDICAID FL2 LEVEL OF CARE FORM     IDENTIFICATION  Patient Name: Seth [name] Birthdate: 1956/07/31 Sex: male Admission Date (Current Location): 03/18/2024  [facility] and [place] Number:  Producer, television/film/video and Address:  [facility], [address] [place], [place] [address]      Provider Number: [id number]  Attending Physician Name and Address:  Rosario Leatrice FERNS, MD  Relative Name and Phone Number:  Raheim, Beutler (Daughter)  [phone]    Current Level of Care: Hospital Recommended Level of Care: Skilled Nursing Facility Prior Approval Number:    Date Approved/Denied:   PASRR Number: [id number]  Discharge Plan: SNF    Current Diagnoses: Patient Active Problem List   Diagnosis Date Noted   Closed displaced comminuted fracture of shaft of right humerus 12/28/2023   Cerebrovascular accident (CVA) (HCC) 12/28/2023   Generalized weakness 11/22/2023   General weakness 11/22/2023   Hyperlipidemia 12/20/2022   GERD (gastroesophageal reflux disease) 12/20/2022   Alcohol withdrawal syndrome without complication (HCC) 12/20/2022   Seizure (HCC) 11/29/2022   Seizure disorder (HCC) 10/18/2022   Asymptomatic bacteriuria 05/24/2022   HAP (hospital-acquired pneumonia) 05/23/2022   Multiple closed fractures of ribs of right side    Fall during current hospitalization 10/16/2021   Peripheral arterial disease (HCC)    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, not intractable, with status epilepticus (HCC)    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HCC)    Seizures (HCC) 05/17/2021   Pressure injury of skin 03/14/2021   Essential hypertension    Chronic alcohol use 01/01/2021   History of CVA with residual RUE weakness 01/01/2021   COVID-19 virus infection 09/14/2020   Acute osteomyelitis of left first metatarsal stump    AKI (acute kidney injury) (HCC) 07/20/2020    Hypoglycemia 07/20/2020   Right sided weakness 02/19/2019   Hyperkalemia 02/19/2019   Left fibular fracture 02/19/2019   Alcohol use disorder 02/19/2019   Malnutrition of moderate degree 08/25/2017   Tobacco use disorder 08/23/2017   COPD (chronic obstructive pulmonary disease) (HCC) 08/23/2017    Orientation RESPIRATION BLADDER Height & Weight     Self, Time, Situation, Place  Normal Continent Weight: 175 lb (79.4 kg) Height:  6' 3 (190.5 cm)  BEHAVIORAL SYMPTOMS/MOOD NEUROLOGICAL BOWEL NUTRITION STATUS    Convulsions/Seizures Incontinent Diet (see dc summary)  AMBULATORY STATUS COMMUNICATION OF NEEDS Skin   Extensive Assist Verbally Normal                       Personal Care Assistance Level of Assistance  Bathing, Feeding, Dressing Bathing Assistance: Limited assistance Feeding assistance: Limited assistance Dressing Assistance: Limited assistance     Functional Limitations Info  Sight, Hearing, Speech Sight Info: Adequate Hearing Info: Adequate Speech Info: Adequate    SPECIAL CARE FACTORS FREQUENCY  PT (By licensed PT), OT (By licensed OT)     PT Frequency: 5x a week OT Frequency: 5x a week            Contractures Contractures Info: Not present    Additional Factors Info  Code Status, Allergies Code Status Info: Full Allergies Info: NKA           Current Medications (03/21/2024):  This is the current hospital active medication list Current Facility-Administered Medications  Medication Dose Route Frequency Provider Last Rate Last Admin   aspirin  EC tablet 81 mg  81 mg Oral Daily Georgina Basket, MD  81 mg at 03/21/24 0801   carvedilol  (COREG ) tablet 6.25 mg  6.25 mg Oral BID WC Georgina Basket, MD   6.25 mg at 03/21/24 0801   enoxaparin  (LOVENOX ) injection 40 mg  40 mg Subcutaneous Q24H Georgina Basket, MD   40 mg at 03/20/24 2108   feeding supplement (ENSURE PLUS HIGH PROTEIN) liquid 237 mL  237 mL Oral TID BM Jens Durand, MD   237 mL at 03/21/24  0801   gabapentin  (NEURONTIN ) capsule 300 mg  300 mg Oral TID Arora, Ashish, MD   300 mg at 03/21/24 0800   lacosamide  (VIMPAT ) tablet 200 mg  200 mg Oral BID Arora, Ashish, MD   200 mg at 03/21/24 0801   levETIRAcetam  (KEPPRA ) tablet 2,000 mg  2,000 mg Oral BID Arora, Ashish, MD   2,000 mg at 03/21/24 0801   pantoprazole  (PROTONIX ) EC tablet 40 mg  40 mg Oral Daily Georgina Basket, MD   40 mg at 03/21/24 0801   PHENobarbital  (LUMINAL) tablet 32.4 mg  32.4 mg Oral BID Arora, Ashish, MD   32.4 mg at 03/21/24 0801   rosuvastatin  (CRESTOR ) tablet 20 mg  20 mg Oral Daily Moore, Willie, MD   20 mg at 03/21/24 0801     Discharge Medications: Please see discharge summary for a list of discharge medications.  Relevant Imaging Results:  Relevant Lab Results:   Additional Information [id number]  Lendia Dais, LCSW

## 2024-03-22 DIAGNOSIS — R569 Unspecified convulsions: Secondary | ICD-10-CM | POA: Diagnosis not present

## 2024-03-22 LAB — GLUCOSE, CAPILLARY
Glucose-Capillary: 78 mg/dL (ref 70–99)
Glucose-Capillary: 82 mg/dL (ref 70–99)

## 2024-03-22 MED ORDER — BISACODYL 10 MG RE SUPP
10.0000 mg | Freq: Every day | RECTAL | Status: DC | PRN
  Administered 2024-03-22: 10 mg via RECTAL
  Filled 2024-03-22: qty 1

## 2024-03-22 NOTE — TOC Progression Note (Signed)
 Transition of Care [facility]) - Progression Note    Patient Details  Name: Seth [name] MRN: [id number] Date of Birth: [birth year]  [facility]) CM/SW Contact  Gwenn Frieze [place], [place] Phone Number: 03/22/2024, 8:01 AM  Clinical Narrative: Home and Community/UHC auth for [facility] remains pending at this time. Will continue to check status and provide updates as available.   Frieze Gwenn, MSW, LCSW [phone] (coverage)        Expected Discharge Plan: Skilled Nursing Facility Barriers to Discharge: Continued Medical Work up, English as a second language teacher               Expected Discharge Plan and Services In-house Referral: Clinical Social Work Discharge Planning Services: [employer] Consult Post Acute Care Choice: Long Term Acute Care (LTAC) Living arrangements for the past 2 months: Single Family Home                                       Social Drivers of Health (SDOH) Interventions SDOH Screenings   Food Insecurity: No Food Insecurity (03/19/2024)  Housing: Low Risk  (03/19/2024)  Transportation Needs: No Transportation Needs (03/19/2024)  Utilities: Not At Risk (03/19/2024)  Depression (PHQ2-9): Low Risk  (08/23/2020)  Financial Resource Strain: Medium Risk (12/07/2017)  Social Connections: Moderately Isolated (03/19/2024)  Stress: Stress Concern Present (12/07/2017)  Tobacco Use: High Risk (03/20/2024)    Readmission Risk Interventions    12/22/2022    4:22 PM  Readmission Risk Prevention Plan  Transportation Screening Complete  HRI or Home Care Consult Complete  Social Work Consult for Recovery Care Planning/Counseling Complete  Palliative Care Screening Not Applicable  Medication Review Oceanographer) Complete

## 2024-03-22 NOTE — Progress Notes (Signed)
 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  Patient was admitted with seizures.  On presentation, phenobarbital  level was subtherapeutic.  03/22/2024: Patient seen.  Stable for discharge.  Awaiting disposition.   Assessment/Plan:   Principal Problem:   Seizure (HCC)    Body mass index is 21.87 kg/m.   Breakthrough seizures: - In the setting of suspected medical nonadherence: Continue phenobarbital , gabapentin , Vimpat  and Keppra . The importance of medical adherence was reiterated. He has been evaluated by the neurologist.   No evidence of infection was found. 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  No seizures were seen throughout the recording. Phenobarbital  level was only 5.3 which is subtherapeutic. Phenobarbital  level was 14.6 on 01/20/2024. 03/22/2024: No further seizures.  Pursue disposition.   History of stroke with right-sided hemiparesis: PT and OT evaluation.  May need SNF at discharge   History of ORIF of right humeral shaft fracture on 12/29/2023.Follow-up visit with Dr. Dale Most, orthopedic surgeon on 01/28/2024.  His note said that okay to slowly begin weightbearing as tolerated using [place].  PT and OT orders have been updated.    Comorbidities include hypertension, hyperlipidemia, GERD   Diet Order             Diet regular Room service appropriate? Yes with Assist; Fluid consistency: Thin  Diet effective now                  Consultants: Neurologist  Procedures: None    Medications:    aspirin  EC  81 mg Oral Daily    carvedilol   6.25 mg Oral BID WC   enoxaparin  (LOVENOX ) injection  40 mg Subcutaneous Q24H   feeding supplement  237 mL Oral TID BM   gabapentin   300 mg Oral TID   lacosamide   200 mg Oral BID   levETIRAcetam   2,000 mg Oral BID   pantoprazole   40 mg Oral Daily   phenobarbital   32.4 mg Oral BID   rosuvastatin   20 mg Oral Daily   Continuous Infusions:   Anti-infectives (From admission, onward)    None       Family Communication/Anticipated D/C date and plan/Code Status   DVT prophylaxis: enoxaparin  (LOVENOX ) injection 40 mg Start: 03/18/24 2200 SCDs Start: 03/18/24 1337     Code Status: Full Code  Family Communication: None Disposition Plan: SNF at discharge   Status is: Inpatient Remains inpatient appropriate because: Seizure   Subjective:   No acute events overnight.  He has no complaints.  He is waiting to go to rehab.  Objective:    Vitals:   03/22/24 0306 03/22/24 0817 03/22/24 1117 03/22/24 1518  BP: 121/77 136/66 (!) 144/75 121/71  Pulse: 69 65 66 68  Resp: 18 20 20 20   Temp: 98.9 F (37.2 C) 97.8 F (36.6 C) 98 F (36.7 C) 98.8 F (37.1 C)  TempSrc: Axillary Oral Oral Oral  SpO2: 99% 98% 100% 100%  Weight:      Height:       No data found.   Intake/Output Summary (Last 24 hours) at 03/22/2024 1630 Last data filed at 03/22/2024 1249 Gross per  24 hour  Intake 480 ml  Output 550 ml  Net -70 ml   Filed Weights   03/18/24 0510  Weight: 79.4 kg    Exam:   GEN: NAD SKIN: Warm and dry EYES: No pallor or icterus ENT: MMM CV: RRR PULM: CTA B ABD: soft, ND, NT, +BS CNS: AAO x 3, right hemiparesis with right hand contracture EXT: No edema or tenderness      Data Reviewed:   I have personally reviewed following labs and imaging studies:  Labs: Labs show the following:   Basic Metabolic Panel: Recent Labs  Lab 03/18/24 0517 03/19/24 1612  NA 134* 135  K 4.7 4.2  CL 99 102  CO2 17* 18*  GLUCOSE 96 66*  BUN 14 12  CREATININE  1.28* 1.00  CALCIUM  10.3 9.0   GFR Estimated Creatinine Clearance: 81.6 mL/min (by C-G formula based on SCr of 1 mg/dL). Liver Function Tests: Recent Labs  Lab 03/18/24 0517  AST 33  ALT 17  ALKPHOS 136*  BILITOT 0.8  PROT 8.1  ALBUMIN  4.2   No results for input(s): LIPASE, AMYLASE in the last 168 hours. No results for input(s): AMMONIA in the last 168 hours. Coagulation profile No results for input(s): INR, PROTIME in the last 168 hours.   CBC: Recent Labs  Lab 03/18/24 0517  WBC 9.0  NEUTROABS 4.9  HGB 14.9  HCT 46.0  MCV 87.0  PLT 262   Cardiac Enzymes: No results for input(s): CKTOTAL, CKMB, CKMBINDEX, TROPONINI in the last 168 hours. BNP (last 3 results) No results for input(s): PROBNP in the last 8760 hours. CBG: Recent Labs  Lab 03/20/24 2126 03/21/24 0637 03/21/24 2100 03/22/24 0052 03/22/24 0635  GLUCAP 109* 78 70 82 78   D-Dimer: No results for input(s): DDIMER in the last 72 hours. Hgb A1c: No results for input(s): HGBA1C in the last 72 hours. Lipid Profile: No results for input(s): CHOL, HDL, LDLCALC, TRIG, CHOLHDL, LDLDIRECT in the last 72 hours. Thyroid  function studies: No results for input(s): TSH, T4TOTAL, T3FREE, THYROIDAB in the last 72 hours.  Invalid input(s): FREET3 Anemia work up: No results for input(s): VITAMINB12, FOLATE, FERRITIN, TIBC, IRON, RETICCTPCT in the last 72 hours. Sepsis Labs: Recent Labs  Lab 03/18/24 0517  WBC 9.0    Microbiology No results found for this or any previous visit (from the past 240 hours).  Procedures and diagnostic studies:  No results found.    LOS: 4 days   Leatrice LILLETTE Chapel  [facility]   Pager on [email or web address]. If 7PM-7AM, please contact night-coverage at [email or web address]     03/22/2024, 4:30 PM

## 2024-03-22 NOTE — Plan of Care (Signed)

## 2024-03-23 DIAGNOSIS — R569 Unspecified convulsions: Secondary | ICD-10-CM | POA: Diagnosis not present

## 2024-03-23 MED ORDER — ENSURE PLUS HIGH PROTEIN PO LIQD: 237.0000 mL | Freq: Three times a day (TID) | ORAL | 1 refills | Status: AC

## 2024-03-23 MED ORDER — PHENOBARBITAL 32.4 MG PO TABS: 32.4000 mg | ORAL_TABLET | Freq: Two times a day (BID) | ORAL | 1 refills | Status: DC

## 2024-03-23 NOTE — TOC Progression Note (Addendum)
 Transition of Care [facility]) - Progression Note    Patient Details  Name: Seth [name] MRN: [id number] Date of Birth: [birth year]  Transition of [facility]) CM/SW Contact  Gwenn Julien [place], [place] Phone Number: 03/23/2024, 9:46 AM  Clinical Narrative:  Home and Community/UHC auth received for [facility] SNF: [id number], valid 7/26-7/29. Spoke to [place] with [facility] admissions who is checking to see if they are able to accept pt today. Will provide updates as available.   UPDATE 1045: [facility] is prepared to admit pt today pending medical clearance. MD updated via secure chat, await response.   Julien Gwenn, MSW, LCSW [phone] (coverage)       Expected Discharge Plan: Skilled Nursing Facility Barriers to Discharge: Continued Medical Work up, English as a second language teacher               Expected Discharge Plan and Services In-house Referral: Clinical Social Work Discharge Planning Services: [employer] Consult Post Acute Care Choice: Long Term Acute Care (LTAC) Living arrangements for the past 2 months: Single Family Home                                       Social Drivers of Health (SDOH) Interventions SDOH Screenings   Food Insecurity: No Food Insecurity (03/19/2024)  Housing: Low Risk  (03/19/2024)  Transportation Needs: No Transportation Needs (03/19/2024)  Utilities: Not At Risk (03/19/2024)  Depression (PHQ2-9): Low Risk  (08/23/2020)  Financial Resource Strain: Medium Risk (12/07/2017)  Social Connections: Moderately Isolated (03/19/2024)  Stress: Stress Concern Present (12/07/2017)  Tobacco Use: High Risk (03/20/2024)    Readmission Risk Interventions    12/22/2022    4:22 PM  Readmission Risk Prevention Plan  Transportation Screening Complete  HRI or Home Care Consult Complete  Social Work Consult for Recovery Care Planning/Counseling Complete  Palliative Care Screening Not Applicable  Medication Review Oceanographer) Complete

## 2024-03-23 NOTE — TOC Transition Note (Signed)
 Transition of Care [facility]) - Discharge Note   Patient Details  Name: Seth [name] MRN: [id number] Date of Birth: February 14, 1956  Transition of [facility]) CM/SW Contact:  Gwenn Julien [place], [place] Phone Number: 03/23/2024, 1:06 PM   Clinical Narrative:  Pt for dc to [facility] today. Spoke to [place] in admissions who confirmed they are prepared to admit pt to room 3205. Pt's dtr [name] aware of dc and reports agreeable. RN provided with number for report and PTAR arranged for transport. SW signing off at dc.   Julien Gwenn, MSW, LCSW [phone] (coverage)       Final next level of care: Skilled Nursing Facility Barriers to Discharge: Barriers Resolved   Patient Goals and CMS Choice   CMS Medicare.gov Compare Post Acute Care list provided to:: Patient Choice offered to / list presented to : Patient [place] ownership interest in [facility].provided to:: Patient    Discharge Placement              Patient [facility] bed at: [facility] Patient to be transferred to facility by: PT[facility] Name of family member notified: [name]/dtr Patient and family notified of of transfer: 03/23/24  Discharge Plan and Services Additional resources added to the After Visit Summary for   In-house Referral: Clinical Social Work Discharge Planning Services: CM Consult Post Acute Care Choice: Long Term Acute Care (LTAC)                               Social Drivers of Health (SDOH) Interventions SDOH Screenings   Food Insecurity: No Food Insecurity (03/19/2024)  Housing: Low Risk  (03/19/2024)  Transportation Needs: No Transportation Needs (03/19/2024)  Utilities: Not At Risk (03/19/2024)  Depression (PHQ2-9): Low Risk  (08/23/2020)  Financial Resource Strain: Medium Risk (12/07/2017)  Social Connections: Moderately Isolated (03/19/2024)  Stress: Stress Concern Present (12/07/2017)  Tobacco Use: High Risk (03/20/2024)     Readmission Risk Interventions     12/22/2022    4:22 PM  Readmission Risk Prevention Plan  Transportation Screening Complete  HRI or Home Care Consult Complete  Social Work Consult for Recovery Care Planning/Counseling Complete  Palliative Care Screening Not Applicable  Medication Review Oceanographer) Complete

## 2024-03-23 NOTE — Progress Notes (Signed)
 [facility] Script and Medical necessity in packet for transport

## 2024-03-23 NOTE — Progress Notes (Signed)
 Attempted to call Jame to give report, Receiving nurse at facility is not available at this time My name and phone number given to answering service Mrs. Devere for staff to call back.

## 2024-03-23 NOTE — Discharge Summary (Signed)
 Physician Discharge Summary  Patient ID: Seth [name] MRN: [id number] DOB/AGE: [birth year] 66 y.o.  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  Patient was admitted with improved seizure.  On presentation, phenobarbital  level was subtherapeutic.  Patient was admitted to the hospital for further assessment and management.  Neurology team was consulted to assist with patient's management.  CT head without contrast was non-revealing.  Patient has remained seizure free.  Neurology team has cleared patient for discharge.  Please monitor CO2 closely.  Repeat BMP within one week of discharge.  Patient will follow up with PCP and Neurology team within one week of discharge.   Breakthrough seizures: - In the setting of suspected medical non-adherence. -Continue phenobarbital , gabapentin , Vimpat  and Keppra .  -The importance of medical adherence was reiterated.  -Neurology team assisted in direction patient's care. -No evidence of infection was found. 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  No seizures were seen throughout the recording. -Phenobarbital  level was only 5.3 on presentation (subtherapeutic). -Phenobarbital  level was 14.6 on 01/20/2024.    History of stroke with right-sided hemiparesis:  -PT and OT evaluation.   -SNF at discharge   History of ORIF of right humeral shaft fracture on 12/29/2023: -Followed-up visit with Dr. Dale Most, orthopedic surgeon on 01/28/2024.   -As per Orthopedic team's note - okay to slowly  begin weightbearing as tolerated using [place].   -PT and OT.   Comorbidities include hypertension, hyperlipidemia, GERD  Consults: neurology  Significant Diagnostic Studies:  CT head without contrast revealed:  1. No acute intracranial abnormality. 2. 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 3. Stable, advanced for age, diffuse white matter changes. 4. Remote lacunar infarcts in the cerebellum bilaterally. 5. Atherosclerotic calcifications in the cavernous carotid arteries bilaterally. 6. Right supraorbital scalp soft tissue swelling without underlying fracture or foreign body.  Discharge Exam: Blood pressure (!) 157/80, pulse 70, temperature 98.2 F (36.8 C), temperature source Oral, resp. rate 18, height 6' 3 (1.905 m), weight 79.4 kg, SpO2 100%.   Disposition: Discharge disposition: 03-Skilled Nursing Facility       Discharge Instructions     Diet - low sodium heart healthy   Complete by: As directed    Increase activity slowly   Complete by: As directed       Allergies as of 03/23/2024   No Known Allergies      Medication List     STOP taking these medications    acetaminophen  650 MG CR tablet Commonly known as: TYLENOL    B-1 100 MG Tabs   cholecalciferol 25 MCG (1000 UNIT) tablet Commonly known as: VITAMIN D3   folic acid  1 MG tablet Commonly known as: FOLVITE    naltrexone 50 MG tablet Commonly known as: DEPADE       TAKE these medications    amLODipine  10 MG tablet Commonly known as: NORVASC  Take 1 tablet (10 mg total) by mouth daily.   aspirin  EC 81 MG tablet Take 1 tablet (81 mg total) by mouth daily. Swallow whole.   carvedilol  6.25  MG tablet Commonly known as: COREG  Take 6.25 mg by mouth in the morning and at bedtime.   feeding supplement Liqd Take 237 mLs by mouth 3 (three) times daily between meals.   gabapentin  300 MG  capsule Commonly known as: NEURONTIN  Take 1 capsule (300 mg total) by mouth 3 (three) times daily.   Lacosamide  100 MG Tabs Take 2 tablets (200 mg total) by mouth 2 (two) times daily. What changed: how much to take   levETIRAcetam  1000 MG tablet Commonly known as: KEPPRA  Take 2 tablets (2,000 mg total) by mouth 2 (two) times daily.   lisinopril  10 MG tablet Commonly known as: ZESTRIL  Take 1 tablet (10 mg total) by mouth daily.   omeprazole 40 MG capsule Commonly known as: PRILOSEC Take 40 mg by mouth daily.   PHENobarbital  32.4 MG tablet Commonly known as: LUMINAL Take 1 tablet (32.4 mg total) by mouth 2 (two) times daily. What changed: Another medication with the same name was removed. Continue taking this medication, and follow the directions you see here.   rosuvastatin  20 MG tablet Commonly known as: CRESTOR  Take 1 tablet (20 mg total) by mouth daily.        Contact information for follow-up providers     Schedule an appointment as soon as possible for a visit  with Connect with your PCP/Specialist as discussed.   Contact information: [email or web address] Call our physician referral line at [phone].             Contact information for after-discharge care     Destination     [facility]   Service: Skilled Nursing Contact information: [address] [place]  [phone]                     Time spent: 35 Minutes.  SignedBETHA Leatrice LILLETTE Rosario 03/23/2024, 12:29 PM

## 2024-04-22 ENCOUNTER — Other Ambulatory Visit: Payer: Self-pay | Admitting: Neurology

## 2024-04-22 MED ORDER — PHENOBARBITAL 64.8 MG PO TABS: 64.8000 mg | ORAL_TABLET | Freq: Two times a day (BID) | ORAL | 3 refills | Status: AC

## 2024-04-22 NOTE — Telephone Encounter (Signed)
 Joen  *(Rn) called to see if MD can fill PT Medication , Pt has appointment coming up  with New PCP , but still need medication until then .   PHENobarbital  (LUMINAL) 32.4 MG tablet   can be sent to    [facility] [place] [facility] [place]   Address: [address] [place], [place] [address] Phone: [phone] Hours:  Open ? Closes 6?PM Fax# [phone]  Joen call back is  [phone]

## 2024-04-22 NOTE — Telephone Encounter (Signed)
 Requested Prescriptions   Pending Prescriptions Disp Refills   PHENobarbital  (LUMINAL) 32.4 MG tablet 60 tablet 1    Sig: Take 1 tablet (32.4 mg total) by mouth 2 (two) times daily.   Last seen 01/31/24 Next appt 09/22/24 Dispenses   Dispensed Days Supply Quantity Provider Pharmacy  phenobarbital  64.8 mg tablet 04/02/2024 30 60 each Raoul Rake, MD [facility]  PHENOBARBITAL   64.8 MG TABS 03/18/2024 30 60 tablet Dombroski, Allison, MD [facility]  phenobarbital  32.4 mg tablet 01/22/2024 30 60 each Immordino, Stephen, FNP [facility]  phenobarbital  64.8 mg tablet 12/13/2023 30 60 each [name], NP [facility]  phenobarbital  64.8 mg tablet 11/14/2023 30 60 each Camara, Amadou, MD [facility]  phenobarbital  64.8 mg tablet 10/17/2023 30 60 each Camara, Amadou, MD [facility]  phenobarbital  64.8 mg tablet 09/19/2023 30 60 each Gregg Lek, MD [facility]  phenobarbital  64.8 mg tablet 08/14/2023 30 60 each Gregg Lek, MD [facility]  phenobarbital  64.8 mg tablet 07/17/2023 30 60 each Camara, Amadou, MD [facility]  phenobarbital  64.8 mg tablet 06/19/2023 30 60 each Gregg Lek, MD [facility]  PHENobarbital  (LUMINAL) 32.4 MG tablet 06/12/2023 15 60 tablet Singh, Prashant K, MD [facility]...  phenobarbital  64.8 mg tablet 05/22/2023 30 30 each [name], NP [facility]SABRASABRA

## 2024-04-24 ENCOUNTER — Telehealth: Payer: Self-pay | Admitting: Neurology

## 2024-04-24 MED ORDER — LACOSAMIDE 100 MG PO TABS: 200.0000 mg | ORAL_TABLET | Freq: Two times a day (BID) | ORAL | 5 refills | Status: DC

## 2024-04-24 NOTE — Telephone Encounter (Signed)
 Sherry from [facility] called wanting to know if a refill request can be sent in for the pt for the Lacosamide  100 MG TABS (Expired) to the [employer]

## 2024-04-24 NOTE — Telephone Encounter (Signed)
 Requested Prescriptions   Pending Prescriptions Disp Refills   Lacosamide  100 MG TABS      Sig: Take 2 tablets (200 mg total) by mouth 2 (two) times daily.   Last seen 01/31/24 Next appt 09/22/24  Dispenses   Dispensed Days Supply Quantity Provider Pharmacy  lacosamide  100 mg tablet 04/02/2024 30 60 each Gregg Lek, MD [facility]  LACOSAMIDE   100 MG TABS 03/18/2024 30 60 tablet Camara, Amadou, MD [facility]  lacosamide  100 mg tablet 02/19/2024 30 60 each Gregg Lek, MD [facility]  lacosamide  100 mg tablet 01/22/2024 30 60 each Gregg Lek, MD [facility]  lacosamide  100 mg tablet 12/13/2023 30 60 each Gregg Lek, MD [facility]  lacosamide  100 mg tablet 11/14/2023 30 60 each Gregg Lek, MD [facility]  lacosamide  100 mg tablet 10/17/2023 30 60 each Gregg Lek, MD [facility]  lacosamide  100 mg tablet 09/19/2023 30 60 each Gregg Lek, MD [facility]  lacosamide  100 mg tablet 08/13/2023 30 60 each Gregg Lek, MD [facility]  Lacosamide  100 MG TABS 06/12/2023 30 60 tablet Singh, Prashant K, MD [facility].SABRASABRA

## 2024-04-25 ENCOUNTER — Encounter: Payer: Self-pay | Admitting: *Deleted

## 2024-04-25 NOTE — Telephone Encounter (Signed)
 Took call from phone room and spoke w/ [place] facility. They are needing to clarify dosing for Lacosamide  and Phenobarbital . I confirmed pt should take as follows per Dr. Gregg:  Phenobarbital  (Luminal) 64.8mg , Take 1 tablet (64.8 mg total) by mouth 2 (two) times daily.  Lacosamide  100mg , Take 2 tablets by mouth 2 (two) times daily  Faxed letter with this info/signed by MD to [phone] per their request. Received fax confirmation.

## 2024-05-07 NOTE — Telephone Encounter (Signed)
 Lea from [facility] [phone] Rjdz#[id number] is asking for a call to discuss Lacosamide   if they are not contacted by 7:49 central standard on 05/08/24 this will be automatically denied

## 2024-05-08 ENCOUNTER — Other Ambulatory Visit: Payer: Self-pay | Admitting: Neurology

## 2024-05-08 MED ORDER — LACOSAMIDE 200 MG PO TABS: 200.0000 mg | ORAL_TABLET | Freq: Two times a day (BID) | ORAL | 2 refills | Status: DC

## 2024-05-08 NOTE — Telephone Encounter (Signed)
 Called and spoke to representative at [employer] and stated that lacosamide  (VIMPAT ) & GAVE THE DIAGNOSIS CODE. They stated that they need the pt to be switched to 200mg  bid instead a 100mg  2 tabs bid.   Routing to Dr. Gregg. If this change is made it should be approved   The issue is that the insurance will approve for 2 tabs a day

## 2024-05-08 NOTE — Telephone Encounter (Signed)
 Lvm 1st attempt by hf 05/08/24

## 2024-05-08 NOTE — Telephone Encounter (Signed)
 Done. Thanks.

## 2024-05-12 NOTE — Telephone Encounter (Signed)
 Lvm 2nd attempt 05/12/24 by hf

## 2024-05-13 NOTE — Telephone Encounter (Signed)
 Lvm 3rd attempt final one closing out encounter

## 2024-05-14 ENCOUNTER — Telehealth: Payer: Self-pay

## 2024-05-14 ENCOUNTER — Other Ambulatory Visit (HOSPITAL_COMMUNITY): Payer: Self-pay

## 2024-05-14 NOTE — Telephone Encounter (Signed)
 Received request for add info for lacosamide  100mg  4 tabs daily, however RX has been changed to 200mg  bid, ran test claim and got a successful paid claim-no pa needed at this time.

## 2024-06-30 ENCOUNTER — Encounter: Payer: Self-pay | Admitting: Radiology

## 2024-07-02 ENCOUNTER — Telehealth: Payer: Self-pay | Admitting: Neurology
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
## 2024-07-02 NOTE — Telephone Encounter (Signed)
 Called and LVM asking for a call back/[facility].  Pt last saw Dr. Gregg 01/31/24 and next f/u 09/22/24.

## 2024-07-02 NOTE — Telephone Encounter (Signed)
 Heron Pepper from [facility] called to request for Pt to be seen sooner . Pt PCP requested for Pt to  follow up with MD  about seizure medication  and dosage   Heron call back is  [phone]

## 2024-07-03 NOTE — Telephone Encounter (Signed)
 LVM for Seth [name]/[phone] . Trying to get update on pt.

## 2024-07-24 NOTE — Progress Notes (Signed)
 No show

## 2024-07-25 ENCOUNTER — Observation Stay (HOSPITAL_COMMUNITY)
Admission: EM | Admit: 2024-07-25 | Discharge: 2024-07-27 | Disposition: A | Discharge status: Skilled Nursing Facility | Attending: Internal Medicine | Admitting: Internal Medicine

## 2024-07-25 ENCOUNTER — Encounter (HOSPITAL_COMMUNITY): Payer: Self-pay

## 2024-07-25 ENCOUNTER — Emergency Department (HOSPITAL_COMMUNITY)

## 2024-07-25 DIAGNOSIS — Z79899 Other long term (current) drug therapy: Secondary | ICD-10-CM | POA: Insufficient documentation

## 2024-07-25 DIAGNOSIS — R5381 Other malaise: Secondary | ICD-10-CM | POA: Diagnosis present

## 2024-07-25 DIAGNOSIS — G40909 Epilepsy, unspecified, not intractable, without status epilepticus: Secondary | ICD-10-CM | POA: Insufficient documentation

## 2024-07-25 DIAGNOSIS — J449 Chronic obstructive pulmonary disease, unspecified: Secondary | ICD-10-CM | POA: Diagnosis not present

## 2024-07-25 DIAGNOSIS — F17210 Nicotine dependence, cigarettes, uncomplicated: Secondary | ICD-10-CM | POA: Diagnosis not present

## 2024-07-25 DIAGNOSIS — I10 Essential (primary) hypertension: Secondary | ICD-10-CM | POA: Diagnosis present

## 2024-07-25 DIAGNOSIS — E878 Other disorders of electrolyte and fluid balance, not elsewhere classified: Secondary | ICD-10-CM | POA: Diagnosis not present

## 2024-07-25 DIAGNOSIS — I69351 Hemiplegia and hemiparesis following cerebral infarction affecting right dominant side: Secondary | ICD-10-CM | POA: Insufficient documentation

## 2024-07-25 DIAGNOSIS — N400 Enlarged prostate without lower urinary tract symptoms: Secondary | ICD-10-CM | POA: Insufficient documentation

## 2024-07-25 DIAGNOSIS — E162 Hypoglycemia, unspecified: Secondary | ICD-10-CM | POA: Diagnosis not present

## 2024-07-25 DIAGNOSIS — R531 Weakness: Secondary | ICD-10-CM

## 2024-07-25 DIAGNOSIS — Z7982 Long term (current) use of aspirin: Secondary | ICD-10-CM | POA: Diagnosis not present

## 2024-07-25 DIAGNOSIS — R569 Unspecified convulsions: Secondary | ICD-10-CM | POA: Diagnosis not present

## 2024-07-25 DIAGNOSIS — R110 Nausea: Secondary | ICD-10-CM

## 2024-07-25 DIAGNOSIS — Z8673 Personal history of transient ischemic attack (TIA), and cerebral infarction without residual deficits: Secondary | ICD-10-CM | POA: Insufficient documentation

## 2024-07-25 DIAGNOSIS — F1090 Alcohol use, unspecified, uncomplicated: Secondary | ICD-10-CM | POA: Insufficient documentation

## 2024-07-25 DIAGNOSIS — F1290 Cannabis use, unspecified, uncomplicated: Secondary | ICD-10-CM | POA: Insufficient documentation

## 2024-07-25 LAB — CBC WITH DIFFERENTIAL/PLATELET
Basophils Absolute: 0.2 K/uL — ABNORMAL HIGH (ref 0.0–0.1)
Basophils Relative: 3 %
Eosinophils Absolute: 0.3 K/uL (ref 0.0–0.5)
Eosinophils Relative: 6 %
HCT: 43.1 % (ref 39.0–52.0)
Hemoglobin: 13.3 g/dL (ref 13.0–17.0)
Lymphocytes Relative: 41 %
Lymphs Abs: 2.3 K/uL (ref 0.7–4.0)
MCH: 27.6 pg (ref 26.0–34.0)
MCHC: 30.9 g/dL (ref 30.0–36.0)
MCV: 89.4 fL (ref 80.0–100.0)
Monocytes Absolute: 0.6 K/uL (ref 0.1–1.0)
Monocytes Relative: 10 %
Neutro Abs: 2.3 K/uL (ref 1.7–7.7)
Neutrophils Relative %: 40 %
Platelets: 154 K/uL (ref 150–400)
RBC: 4.82 MIL/uL (ref 4.22–5.81)
RDW: 16.4 % — ABNORMAL HIGH (ref 11.5–15.5)
WBC: 5.7 K/uL (ref 4.0–10.5)
nRBC: 0 % (ref 0.0–0.2)

## 2024-07-25 LAB — COMPREHENSIVE METABOLIC PANEL WITH GFR
ALT: 10 U/L (ref 0–44)
AST: 20 U/L (ref 15–41)
Albumin: 2.2 g/dL — ABNORMAL LOW (ref 3.5–5.0)
Alkaline Phosphatase: 73 U/L (ref 38–126)
Anion gap: 7 (ref 5–15)
BUN: 11 mg/dL (ref 8–23)
CO2: 16 mmol/L — ABNORMAL LOW (ref 22–32)
Calcium: 5.9 mg/dL — CL (ref 8.9–10.3)
Chloride: 120 mmol/L — ABNORMAL HIGH (ref 98–111)
Creatinine, Ser: 0.6 mg/dL — ABNORMAL LOW (ref 0.61–1.24)
GFR, Estimated: >60 mL/min (ref >60)
Glucose, Bld: 68 mg/dL — ABNORMAL LOW (ref 70–99)
Potassium: 3.7 mmol/L (ref 3.5–5.1)
Sodium: 143 mmol/L (ref 135–145)
Total Bilirubin: 0.7 mg/dL (ref 0.0–1.2)
Total Protein: 4.3 g/dL — ABNORMAL LOW (ref 6.5–8.1)

## 2024-07-25 LAB — MAGNESIUM: Magnesium: 1.4 mg/dL — ABNORMAL LOW (ref 1.7–2.4)

## 2024-07-25 LAB — ETHANOL: Alcohol, Ethyl (B): <15 mg/dL (ref <15)

## 2024-07-25 LAB — RESP PANEL BY RT-PCR (RSV, FLU A&B, COVID)  RVPGX2
Influenza A by PCR: NEGATIVE
Influenza B by PCR: NEGATIVE
Resp Syncytial Virus by PCR: NEGATIVE
SARS Coronavirus 2 by RT PCR: NEGATIVE

## 2024-07-25 LAB — URINALYSIS, ROUTINE W REFLEX MICROSCOPIC
Bilirubin Urine: NEGATIVE
Glucose, UA: NEGATIVE mg/dL
Hgb urine dipstick: NEGATIVE
Ketones, ur: NEGATIVE mg/dL
Leukocytes,Ua: NEGATIVE
Nitrite: NEGATIVE
Protein, ur: NEGATIVE mg/dL
Specific Gravity, Urine: 1.017 (ref 1.005–1.030)
pH: 6 (ref 5.0–8.0)

## 2024-07-25 LAB — LIPASE, BLOOD: Lipase: 30 U/L (ref 11–51)

## 2024-07-25 LAB — CBG MONITORING, ED
Glucose-Capillary: 65 mg/dL — ABNORMAL LOW (ref 70–99)
Glucose-Capillary: 89 mg/dL (ref 70–99)
Glucose-Capillary: 62 mg/dL — ABNORMAL LOW (ref 70–99)
Glucose-Capillary: 77 mg/dL (ref 70–99)

## 2024-07-25 LAB — TROPONIN I (HIGH SENSITIVITY): Troponin I (High Sensitivity): <2 ng/L (ref <18)

## 2024-07-25 MED ORDER — ONDANSETRON 4 MG PO TBDP
4.0000 mg | ORAL_TABLET | Freq: Once | ORAL | Status: AC
  Administered 2024-07-25: 4 mg via ORAL
  Filled 2024-07-25: qty 1

## 2024-07-25 MED ORDER — IOHEXOL 350 MG/ML SOLN
75.0000 mL | Freq: Once | INTRAVENOUS | Status: AC | PRN
  Administered 2024-07-25: 75 mL via INTRAVENOUS

## 2024-07-25 MED ORDER — ASPIRIN 81 MG PO TBEC
81.0000 mg | DELAYED_RELEASE_TABLET | Freq: Every day | ORAL | Status: DC
  Administered 2024-07-26 – 2024-07-27 (×2): 81 mg via ORAL
  Filled 2024-07-25 (×2): qty 1

## 2024-07-25 MED ORDER — CARVEDILOL 6.25 MG PO TABS
6.2500 mg | ORAL_TABLET | Freq: Two times a day (BID) | ORAL | Status: DC
  Administered 2024-07-25 – 2024-07-27 (×4): 6.25 mg via ORAL
  Filled 2024-07-25 (×4): qty 1

## 2024-07-25 MED ORDER — ONDANSETRON HCL 4 MG PO TABS: 4.0000 mg | ORAL_TABLET | Freq: Four times a day (QID) | ORAL | Status: DC | PRN

## 2024-07-25 MED ORDER — GABAPENTIN 300 MG PO CAPS
300.0000 mg | ORAL_CAPSULE | Freq: Three times a day (TID) | ORAL | Status: DC
  Administered 2024-07-25 – 2024-07-27 (×5): 300 mg via ORAL
  Filled 2024-07-25 (×5): qty 1

## 2024-07-25 MED ORDER — CALCIUM GLUCONATE-NACL 1-0.675 GM/50ML-% IV SOLN
1.0000 g | Freq: Once | INTRAVENOUS | Status: AC
  Administered 2024-07-25: 1000 mg via INTRAVENOUS
  Filled 2024-07-25: qty 50

## 2024-07-25 MED ORDER — LISINOPRIL 10 MG PO TABS
10.0000 mg | ORAL_TABLET | Freq: Every day | ORAL | Status: DC
  Administered 2024-07-26: 10 mg via ORAL
  Filled 2024-07-25: qty 1

## 2024-07-25 MED ORDER — OXYCODONE HCL 5 MG PO TABS
5.0000 mg | ORAL_TABLET | ORAL | Status: DC | PRN
  Administered 2024-07-25: 5 mg via ORAL
  Filled 2024-07-25: qty 1

## 2024-07-25 MED ORDER — LACOSAMIDE 200 MG PO TABS
200.0000 mg | ORAL_TABLET | Freq: Two times a day (BID) | ORAL | Status: DC
  Administered 2024-07-25 – 2024-07-27 (×4): 200 mg via ORAL
  Filled 2024-07-25 (×4): qty 1

## 2024-07-25 MED ORDER — ROSUVASTATIN CALCIUM 20 MG PO TABS
20.0000 mg | ORAL_TABLET | Freq: Every day | ORAL | Status: DC
  Administered 2024-07-26 – 2024-07-27 (×2): 20 mg via ORAL
  Filled 2024-07-25 (×2): qty 1

## 2024-07-25 MED ORDER — AMLODIPINE BESYLATE 10 MG PO TABS
10.0000 mg | ORAL_TABLET | Freq: Every day | ORAL | Status: DC
  Administered 2024-07-26 – 2024-07-27 (×2): 10 mg via ORAL
  Filled 2024-07-25 (×2): qty 1

## 2024-07-25 MED ORDER — MAGNESIUM SULFATE 2 GM/50ML IV SOLN
2.0000 g | Freq: Once | INTRAVENOUS | Status: AC
  Administered 2024-07-25: 2 g via INTRAVENOUS
  Filled 2024-07-25: qty 50

## 2024-07-25 MED ORDER — DEXTROSE 50 % IV SOLN
1.0000 | Freq: Once | INTRAVENOUS | Status: AC
  Administered 2024-07-25: 50 mL via INTRAVENOUS
  Filled 2024-07-25: qty 50

## 2024-07-25 MED ORDER — ACETAMINOPHEN 325 MG PO TABS
650.0000 mg | ORAL_TABLET | Freq: Four times a day (QID) | ORAL | Status: DC | PRN
  Administered 2024-07-25: 650 mg via ORAL
  Filled 2024-07-25: qty 2

## 2024-07-25 MED ORDER — SENNOSIDES-DOCUSATE SODIUM 8.6-50 MG PO TABS: 1.0000 | ORAL_TABLET | Freq: Every evening | ORAL | Status: DC | PRN

## 2024-07-25 MED ORDER — SODIUM CHLORIDE 0.9 % IV BOLUS
1000.0000 mL | Freq: Once | INTRAVENOUS | Status: AC
Start: 2024-07-25 — End: 2024-07-25
  Administered 2024-07-25: 1000 mL via INTRAVENOUS

## 2024-07-25 MED ORDER — SODIUM CHLORIDE 0.9% FLUSH
3.0000 mL | Freq: Two times a day (BID) | INTRAVENOUS | Status: DC
  Administered 2024-07-26 – 2024-07-27 (×3): 3 mL via INTRAVENOUS

## 2024-07-25 MED ORDER — POTASSIUM CHLORIDE CRYS ER 20 MEQ PO TBCR
40.0000 meq | EXTENDED_RELEASE_TABLET | Freq: Once | ORAL | Status: AC
  Administered 2024-07-25: 40 meq via ORAL
  Filled 2024-07-25: qty 2

## 2024-07-25 MED ORDER — ACETAMINOPHEN 650 MG RE SUPP: 650.0000 mg | Freq: Four times a day (QID) | RECTAL | Status: DC | PRN

## 2024-07-25 MED ORDER — PANTOPRAZOLE SODIUM 40 MG PO TBEC
40.0000 mg | DELAYED_RELEASE_TABLET | Freq: Every day | ORAL | Status: DC
  Administered 2024-07-26 – 2024-07-27 (×2): 40 mg via ORAL
  Filled 2024-07-25 (×2): qty 1

## 2024-07-25 MED ORDER — LEVETIRACETAM 500 MG PO TABS
2000.0000 mg | ORAL_TABLET | Freq: Two times a day (BID) | ORAL | Status: DC
  Administered 2024-07-25 – 2024-07-27 (×4): 2000 mg via ORAL
  Filled 2024-07-25 (×4): qty 4

## 2024-07-25 MED ORDER — ENOXAPARIN SODIUM 40 MG/0.4ML IJ SOSY
40.0000 mg | PREFILLED_SYRINGE | INTRAMUSCULAR | Status: DC
  Administered 2024-07-25 – 2024-07-26 (×2): 40 mg via SUBCUTANEOUS
  Filled 2024-07-25 (×2): qty 0.4

## 2024-07-25 MED ORDER — ONDANSETRON HCL 4 MG/2ML IJ SOLN
4.0000 mg | Freq: Four times a day (QID) | INTRAMUSCULAR | Status: DC | PRN
  Administered 2024-07-25: 4 mg via INTRAVENOUS
  Filled 2024-07-25: qty 2

## 2024-07-25 MED ORDER — PHENOBARBITAL 32.4 MG PO TABS
64.8000 mg | ORAL_TABLET | Freq: Two times a day (BID) | ORAL | Status: DC
  Administered 2024-07-25 – 2024-07-27 (×4): 64.8 mg via ORAL
  Filled 2024-07-25 (×4): qty 2

## 2024-07-25 NOTE — ED Notes (Signed)
 NT gave pt orange juice due to CBG being 62

## 2024-07-25 NOTE — ED Triage Notes (Signed)
 Per EMS, Pt, from [place] [facility] [place], c/o nausea and feeling groggy x several days.  Denies pain and emesis.  Pt thinks his medications are making him feel funny.
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
## 2024-07-25 NOTE — ED Notes (Signed)
 Pt ok to have water  per Dr. Ginger, water  provided

## 2024-07-25 NOTE — ED Provider Triage Note (Signed)
 Emergency Medicine Provider Triage Evaluation Note  Seth [name] , a 68 y.o. male  was evaluated in triage.  Pt complains of nausea, malaise  Pt reports nausea over last few days w/o emesis, no abd pain, no change to bowel/bladder fxn and no fevers. No cp/dib.  Thinks his medications are making him groggy, having some generalized weakness last few days  Review of Systems  Positive: nausea Negative: Cp/abd pain/ fever/ vomit  Physical Exam  BP (!) 152/80   Pulse 62   Temp 98.4 F (36.9 C) (Oral)   Resp 16   Ht 6' 3 (1.905 m)   Wt 79.4 kg   SpO2 95%   BMI 21.87 kg/m  Gen:   Awake, no distress   Resp:  Normal effort  MSK:   Moves extremities without difficulty  Other:  Abd benign  Medical Decision Making  Medically screening exam initiated at 11:16 AM.  Appropriate orders placed.  Seth [name] was informed that the remainder of the evaluation will be completed by another provider, this initial triage assessment does not replace that evaluation, and the importance of remaining in the ED until their evaluation is complete.  Cbg low, give juice Check labs    Seth Jayson LABOR, DO 07/25/24 1117
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
## 2024-07-25 NOTE — ED Provider Notes (Signed)
 Care assumed from Dr. Elnor.  At time of transfer of care, patient is awaiting results of CBC and CT and reassessment due to the recurrent episodes of hypoglycemia.  Given the patient's workup thus far, anticipate he may require admission with the recurrent hypoglycemic episodes, the hypocalcemia, and the persistent GI symptoms.  7:50 PM Workup returned and CT scan did not show acute surgical problem.  Patient reportedly still feels fatigued and does not have an appetite.  Previous team's plan was to admit for further management of hypocalcemia, low magnesium , and fatigue.  Patient agrees with plan for admission for electrolyte disturbance and fatigue.   Clinical Impression: 1. Hypocalcemia   2. Nausea   3. Generalized weakness   4. Hypoglycemia   5. Hypomagnesemia     Disposition: Admit  This note was prepared with assistance of Dragon voice recognition software. Occasional wrong-word or sound-a-like substitutions may have occurred due to the inherent limitations of voice recognition software.     Nevena Rozenberg, Lonni PARAS, MD 07/25/24 2248

## 2024-07-25 NOTE — ED Notes (Signed)
 Pt given 4oz orange juice d/t CBG.

## 2024-07-26 DIAGNOSIS — E878 Other disorders of electrolyte and fluid balance, not elsewhere classified: Secondary | ICD-10-CM | POA: Diagnosis not present

## 2024-07-26 LAB — BASIC METABOLIC PANEL WITH GFR
Anion gap: 13 (ref 5–15)
Anion gap: 11 (ref 5–15)
Anion gap: 11 (ref 5–15)
BUN: 12 mg/dL (ref 8–23)
BUN: 10 mg/dL (ref 8–23)
BUN: 14 mg/dL (ref 8–23)
CO2: 20 mmol/L — ABNORMAL LOW (ref 22–32)
CO2: 21 mmol/L — ABNORMAL LOW (ref 22–32)
CO2: 23 mmol/L (ref 22–32)
Calcium: 8.7 mg/dL — ABNORMAL LOW (ref 8.9–10.3)
Calcium: 8.9 mg/dL (ref 8.9–10.3)
Calcium: 8.9 mg/dL (ref 8.9–10.3)
Chloride: 101 mmol/L (ref 98–111)
Chloride: 105 mmol/L (ref 98–111)
Chloride: 105 mmol/L (ref 98–111)
Creatinine, Ser: 0.87 mg/dL (ref 0.61–1.24)
Creatinine, Ser: 1 mg/dL (ref 0.61–1.24)
Creatinine, Ser: 0.94 mg/dL (ref 0.61–1.24)
GFR, Estimated: >60 mL/min (ref >60)
GFR, Estimated: >60 mL/min (ref >60)
GFR, Estimated: >60 mL/min (ref >60)
Glucose, Bld: 84 mg/dL (ref 70–99)
Glucose, Bld: 73 mg/dL (ref 70–99)
Glucose, Bld: 70 mg/dL (ref 70–99)
Potassium: 5.5 mmol/L — ABNORMAL HIGH (ref 3.5–5.1)
Potassium: 5.3 mmol/L — ABNORMAL HIGH (ref 3.5–5.1)
Potassium: 4.7 mmol/L (ref 3.5–5.1)
Sodium: 134 mmol/L — ABNORMAL LOW (ref 135–145)
Sodium: 137 mmol/L (ref 135–145)
Sodium: 139 mmol/L (ref 135–145)

## 2024-07-26 LAB — CBC
HCT: 45 % (ref 39.0–52.0)
Hemoglobin: 14.4 g/dL (ref 13.0–17.0)
MCH: 27.9 pg (ref 26.0–34.0)
MCHC: 32 g/dL (ref 30.0–36.0)
MCV: 87 fL (ref 80.0–100.0)
Platelets: 190 K/uL (ref 150–400)
RBC: 5.17 MIL/uL (ref 4.22–5.81)
RDW: 15.9 % — ABNORMAL HIGH (ref 11.5–15.5)
WBC: 5.2 K/uL (ref 4.0–10.5)
nRBC: 0 % (ref 0.0–0.2)

## 2024-07-26 LAB — PHOSPHORUS: Phosphorus: 3.6 mg/dL (ref 2.5–4.6)

## 2024-07-26 LAB — CORTISOL: Cortisol, Plasma: 3.3 ug/dL

## 2024-07-26 LAB — CORTISOL-AM, BLOOD: Cortisol - AM: 9.8 ug/dL (ref 6.7–22.6)

## 2024-07-26 LAB — MAGNESIUM: Magnesium: 2.4 mg/dL (ref 1.7–2.4)

## 2024-07-26 MED ORDER — IPRATROPIUM-ALBUTEROL 0.5-2.5 (3) MG/3ML IN SOLN: 3.0000 mL | Freq: Four times a day (QID) | RESPIRATORY_TRACT | Status: DC | PRN

## 2024-07-26 MED ORDER — ENSURE PLUS HIGH PROTEIN PO LIQD
237.0000 mL | Freq: Two times a day (BID) | ORAL | Status: DC
  Administered 2024-07-26 – 2024-07-27 (×4): 237 mL via ORAL

## 2024-07-26 NOTE — Plan of Care (Signed)

## 2024-07-26 NOTE — Progress Notes (Addendum)
 PROGRESS NOTE    Seth [name]  FMW:[id number] DOB: 1956-03-30 DOA: 07/25/2024 PCP: [name], NP   Brief Narrative: 68 year old with past medical history significant for traumatic SDH, history of CVA, seizure, hypertension, COPD presents with nausea, loss of appetite and fatigue.  Symptoms started the night prior to admission.  He denies abdominal pain, fever or diarrhea.  Evaluation in the ED: PMH notable for bicarb of 16, glucose 68, magnesium  1.4, calcium  5.9, albumin  2.2.  Normal liver function test and lipase.  No acute findings CT abdomen and pelvis.  He was treated with IV magnesium , IV calcium  gluconate, amp of D50 and potassium    Assessment & Plan:   Principal Problem:   Electrolyte disturbance Active Problems:   Seizures (HCC)   COPD (chronic obstructive pulmonary disease) (HCC)   History of CVA with residual RUE weakness   Hypoglycemia   Essential hypertension   1-Electrolytes disturbance:  Hypomagnesemia, metabolic acidosis, hypocalcemia: - He presents with hypocalcemia, hypomagnesemia hypoglycemia, low serum bicarb. - Received IV calcium , IV magnesium  and IV dextrose . Previous history of hypoglycemia thought to be related to phenobarbital . - Cortisol level: 3.3 yesterday night., check morning cortisol.  -electrolytes abnormalities improving.   Hyperkalemia; hemolyses labs  Nausea:  -resolved. Tolerates breakfast.   Seizure disorder: - Continue Vimpat , Keppra , gabapentin  Phenobarbital    History of CVA: -Continue aspirin  and Crestor    Hypertension: - Continue Norvasc , Coreg , lisinopril   COPD: - As needed nebulizer orders       Estimated body mass index is 21.87 kg/m as calculated from the following:   Height as of this encounter: 6' 3 (1.905 m).   Weight as of this encounter: 79.4 kg.   DVT prophylaxis: Lovenox  Code Status: Full code Family Communication: Disposition Plan:  Status is: Observation The patient remains OBS  appropriate and will d/c before 2 midnights.Back to rehab tomorrow    Consultants:  none  Procedures:    Antimicrobials:    Subjective: He is feeling better. He was not eating much at rehab. Food is not good.  Nausea resolved. Had BM. Denies abdominal pain .   Objective: Vitals:   07/25/24 2226 07/26/24 0009 07/26/24 0558 07/26/24 0746  BP: (!) 158/95 98/65 113/70 119/78  Pulse: 77 78 (!) 58 (!) 59  Resp: 16 16 20 18   Temp: 98.2 F (36.8 C) 97.9 F (36.6 C) 98.5 F (36.9 C) 98.3 F (36.8 C)  TempSrc: Oral Oral Oral Oral  SpO2: 99% 96% 100% 98%  Weight:      Height:        Intake/Output Summary (Last 24 hours) at 07/26/2024 0751 Last data filed at 07/26/2024 0747 Gross per 24 hour  Intake 1048.33 ml  Output 150 ml  Net 898.33 ml   Filed Weights   07/25/24 1110  Weight: 79.4 kg    Examination:  General exam: Appears calm and comfortable  Respiratory system: Clear to auscultation. Respiratory effort normal. Cardiovascular system: S1 & S2 heard, RRR. No JVD, murmurs, rubs, gallops or clicks. No pedal edema. Gastrointestinal system: Abdomen is nondistended, soft and nontender. No organomegaly or masses felt. Normal bowel sounds heard. Central nervous system: Alert and oriented. No focal neurological deficits. Extremities: Symmetric 5 x 5 power.   Data Reviewed: I have personally reviewed following labs and imaging studies  CBC: Recent Labs  Lab 07/25/24 1900 07/26/24 0607  WBC 5.7 5.2  NEUTROABS 2.3  --   HGB 13.3 14.4  HCT 43.1 45.0  MCV 89.4 87.0  PLT 154  190   Basic Metabolic Panel: Recent Labs  Lab 07/25/24 1343 07/25/24 1641 07/25/24 2258  NA 143  --  134*  K 3.7  --  5.5*  CL 120*  --  101  CO2 16*  --  20*  GLUCOSE 68*  --  84  BUN 11  --  12  CREATININE 0.60*  --  0.87  CALCIUM  5.9*  --  8.7*  MG  --  1.4*  --    GFR: Estimated Creatinine Clearance: 93.8 mL/min (by C-G formula based on SCr of 0.87 mg/dL). Liver Function  Tests: Recent Labs  Lab 07/25/24 1343  AST 20  ALT 10  ALKPHOS 73  BILITOT 0.7  PROT 4.3*  ALBUMIN  2.2*   Recent Labs  Lab 07/25/24 1343  LIPASE 30   No results for input(s): AMMONIA in the last 168 hours. Coagulation Profile: No results for input(s): INR, PROTIME in the last 168 hours. Cardiac Enzymes: No results for input(s): CKTOTAL, CKMB, CKMBINDEX, TROPONINI in the last 168 hours. BNP (last 3 results) No results for input(s): PROBNP in the last 8760 hours. HbA1C: No results for input(s): HGBA1C in the last 72 hours. CBG: Recent Labs  Lab 07/25/24 1108 07/25/24 1148 07/25/24 1304 07/25/24 1349  GLUCAP 65* 89 62* 77   Lipid Profile: No results for input(s): CHOL, HDL, LDLCALC, TRIG, CHOLHDL, LDLDIRECT in the last 72 hours. Thyroid  Function Tests: No results for input(s): TSH, T4TOTAL, FREET4, T3FREE, THYROIDAB in the last 72 hours. Anemia Panel: No results for input(s): VITAMINB12, FOLATE, FERRITIN, TIBC, IRON, RETICCTPCT in the last 72 hours. Sepsis Labs: No results for input(s): PROCALCITON, LATICACIDVEN in the last 168 hours.  Recent Results (from the past 240 hours)  Resp panel by RT-PCR (RSV, Flu A&B, Covid) Anterior Nasal Swab     Status: None   Collection Time: 07/25/24 11:21 AM   Specimen: Anterior Nasal Swab  Result Value Ref Range Status   SARS Coronavirus 2 by RT PCR NEGATIVE NEGATIVE Final   Influenza A by PCR NEGATIVE NEGATIVE Final   Influenza B by PCR NEGATIVE NEGATIVE Final    Comment: (NOTE) The Xpert Xpress SARS-CoV-2/FLU/RSV plus assay is intended as an aid in the diagnosis of influenza from Nasopharyngeal swab specimens and should not be used as a sole basis for treatment. Nasal washings and aspirates are unacceptable for Xpert Xpress SARS-CoV-2/FLU/RSV testing.  Fact Sheet for Patients: bloggercourse.com  Fact Sheet for Healthcare  Providers: seriousbroker.it  This test is not yet approved or cleared by the [place]  FDA and has been authorized for detection and/or diagnosis of SARS-CoV-2 by FDA under an Emergency Use Authorization (EUA). This EUA will remain in effect (meaning this test can be used) for the duration of the COVID-19 declaration under Section 564(b)(1) of the Act, 21 U.S.C. section 360bbb-3(b)(1), unless the authorization is terminated or revoked.     Resp Syncytial Virus by PCR NEGATIVE NEGATIVE Final    Comment: (NOTE) Fact Sheet for Patients: bloggercourse.com  Fact Sheet for Healthcare Providers: seriousbroker.it  This test is not yet approved or cleared by the [place]  FDA and has been authorized for detection and/or diagnosis of SARS-CoV-2 by FDA under an Emergency Use Authorization (EUA). This EUA will remain in effect (meaning this test can be used) for the duration of the COVID-19 declaration under Section 564(b)(1) of the Act, 21 U.S.C. section 360bbb-3(b)(1), unless the authorization is terminated or revoked.  Performed at [facility], [address] [place], [place]  [address]          Radiology Studies: CT ABDOMEN PELVIS W CONTRAST Result Date: 07/25/2024 EXAM: CT ABDOMEN AND PELVIS WITH CONTRAST 07/25/2024 04:17:39 PM TECHNIQUE: CT of the abdomen and pelvis was performed with the administration of 75 mL of iohexol  (OMNIPAQUE ) 350 MG/ML injection. Multiplanar reformatted images are provided for review. Automated exposure control, iterative reconstruction, and/or weight-based adjustment of the mA/kV was utilized to reduce the radiation dose to as low as reasonably achievable. COMPARISON: Comparison with 05/19/2022. CLINICAL HISTORY: Abdominal pain, acute, nonlocalized; nausea. FINDINGS: LOWER CHEST: No acute abnormality. LIVER: The liver is unremarkable. GALLBLADDER AND BILE DUCTS:  Gallbladder is unremarkable. No biliary ductal dilatation. SPLEEN: No acute abnormality. PANCREAS: No acute abnormality. ADRENAL GLANDS: No acute abnormality. KIDNEYS, URETERS AND BLADDER: No stones in the kidneys or ureters. No hydronephrosis. No perinephric or periureteral stranding. GI AND BOWEL: Stomach demonstrates no acute abnormality. Colonic diverticulosis without diverticulitis. Normal appendix. There is no bowel obstruction. PERITONEUM AND RETROPERITONEUM: No ascites. No free air. VASCULATURE: Aorta is normal in caliber with atherosclerotic calcification. LYMPH NODES: No lymphadenopathy. REPRODUCTIVE ORGANS: Enlarged prostate indenting the floor of the bladder. BONES AND SOFT TISSUES: No acute osseous abnormality. No focal soft tissue abnormality. IMPRESSION: 1. No acute findings in the abdomen or pelvis. 2. Enlarged prostate. Electronically signed by: Norman Gatlin MD 07/25/2024 04:48 PM EST RP Workstation: [id number]   DG Chest Portable 1 View Result Date: 07/25/2024 CLINICAL DATA:  Weakness. EXAM: PORTABLE CHEST 1 VIEW COMPARISON:  03/18/2024. FINDINGS: Cardiac silhouette is normal in size. No mediastinal or hilar masses. Lungs are hyperexpanded. There is blunting of the lateral right costophrenic sulcus similar to prior studies. Lungs are clear. No convincing pleural effusion and no pneumothorax. Previous ORIF of a right humerus fracture. IMPRESSION: No acute cardiopulmonary disease. Electronically Signed   By: Alm Parkins M.D.   On: 07/25/2024 12:16        Scheduled Meds:  amLODipine   10 mg Oral Daily   aspirin  EC  81 mg Oral Daily   carvedilol   6.25 mg Oral BID WC   enoxaparin  (LOVENOX ) injection  40 mg Subcutaneous Q24H   gabapentin   300 mg Oral TID   lacosamide   200 mg Oral BID   levETIRAcetam   2,000 mg Oral BID   lisinopril   10 mg Oral Daily   pantoprazole   40 mg Oral Daily   PHENobarbital   64.8 mg Oral BID   rosuvastatin   20 mg Oral Daily   sodium chloride  flush  3 mL  Intravenous Q12H   Continuous Infusions:   LOS: 0 days    Time spent: 35 minutes    Katelin Kutsch A Chazlyn Cude, MD [facility]   If 7PM-7AM, please contact night-coverage [email or web address]  07/26/2024, 7:51 AM

## 2024-07-26 NOTE — Evaluation (Signed)
 Occupational Therapy Evaluation Patient Details Name: Seth [name] MRN: [id number] DOB: [birth year] Today's Date: 07/26/2024   History of Present Illness   68 y.o. male presents to [facility] 07/25/24 from [place] with nausea, loss of appetite and fatigue. Admitted with electrolyte disturbances. PMHx: traumatic SDH complicated by seizures, polysubstance abuse including alcohol, PVD, prior stroke with right-sided weakness, COPD, L transmetatarsal amputation and HTN     Clinical Impressions Pt reported at PLOF they live at [facility] and used a RW throughout the community. Pt reported they just assist with medication management but can assist more if needed. At this time pt set up for sitting ADLS and supervision to CGA with standing ADLS as noted slight posterior tilt when completion of BUE ADLS at sink. At this time recommendation for [facility] therapy or if the ALF has a in house therapy would recommend post discharge.      If plan is discharge home, recommend the following:   Direct supervision/assist for medications management;Supervision due to cognitive status;Assistance with cooking/housework     Functional Status Assessment   Patient has had a recent decline in their functional status and demonstrates the ability to make significant improvements in function in a reasonable and predictable amount of time.     Equipment Recommendations   BSC/3in1     Recommendations for Other Services         Precautions/Restrictions   Precautions Precautions: Fall Recall of Precautions/Restrictions: Intact Restrictions Weight Bearing Restrictions Per Provider Order: No     Mobility Bed Mobility Overal bed mobility: Needs Assistance Bed Mobility: Supine to Sit     Supine to sit: Independent     General bed mobility comments: went to L side (side exit at home)    Transfers Overall transfer level: Needs assistance Equipment used: Rolling Mastro (2 wheels) Transfers: Sit  to/from Stand Sit to Stand: Supervision           General transfer comment: for safety      Balance Overall balance assessment: Needs assistance Sitting-balance support: Feet supported, No upper extremity supported Sitting balance-[name] Scale: Good     Standing balance support: No upper extremity supported, Bilateral upper extremity supported Standing balance-[name] Scale: Fair Standing balance comment: pt noted slight BUE support                           ADL either performed or assessed with clinical judgement   ADL Overall ADL's : Needs assistance/impaired Eating/Feeding: Independent;Sitting   Grooming: Wash/dry hands;Wash/dry face;Set up;Cueing for UE precautions;Contact guard assist Grooming Details (indicate cue type and reason): noted slight posterior sway but pt aware Upper Body Bathing: Set up;Sitting   Lower Body Bathing: Set up;Sit to/from stand   Upper Body Dressing : Set up;Sitting   Lower Body Dressing: Set up;Sit to/from stand   Toilet Transfer: Supervision/safety;Contact guard assist;Rolling Stick (2 wheels)   Toileting- Clothing Manipulation and Hygiene: Contact guard assist;Supervision/safety;Sit to/from stand       Functional mobility during ADLs: Supervision/safety;Contact guard assist;Rolling [place] (2 wheels)       Vision         Perception         Praxis         Pertinent Vitals/Pain Pain Assessment Pain Assessment: No/denies pain     Extremity/Trunk Assessment Upper Extremity Assessment Upper Extremity Assessment: RUE deficits/detail RUE Deficits / Details: Pt reported old sx, CVA x2 resulting in decrease in FM. Pt extenstion at all joint  3-5. decrease in elbow extension and pronation. Pt limited AROM at shouler flex to 45 deg RUE Sensation: WNL RUE Coordination: decreased fine motor;decreased gross motor   Lower Extremity Assessment Lower Extremity Assessment: Defer to PT evaluation LLE Deficits / Details:  prior L transmetatarsal amputation LLE Sensation: WNL   Cervical / Trunk Assessment Cervical / Trunk Assessment: Kyphotic   Communication Communication Communication: No apparent difficulties Factors Affecting Communication: 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)   Cognition Arousal: Alert Behavior During Therapy: WFL for tasks assessed/performed Cognition: No apparent impairments             OT - Cognition Comments: noted slightly slower processing and pt agreed                 Following commands: Intact       Cueing  General Comments   Cueing Techniques: Verbal cues;Tactile cues  VSS on RA   Exercises     Shoulder Instructions      Home Living Family/patient expects to be discharged to:: Assisted living                             Home Equipment: Grab bars - tub/shower;Grab bars - toilet;Rolling Chery (2 wheels);Cane - single point;Shower seat;Other (comment) (L hemi Majewski)   Additional Comments: At [facility] for the next few weeks. Will then d/c home with home health services      Prior Functioning/Environment Prior Level of Function : Needs assist             Mobility Comments: ambulates with RW with staff close by for supervision ADLs Comments: staff completes med management and does not like to have assist to assist with ADLS    OT Problem List: Decreased strength;Decreased safety awareness   OT Treatment/Interventions: Self-care/ADL training;Therapeutic activities;Balance training      OT Goals(Current goals can be found in the care plan section)   Acute Rehab OT Goals Patient Stated Goal: to go back to ALF OT Goal Formulation: With patient Time For Goal Achievement: 08/09/24 Potential to Achieve Goals: Good   OT Frequency:  Min 2X/week    Co-evaluation              AM-PAC OT 6 Clicks Daily Activity     Outcome Measure Help from another person eating meals?: None Help from another person taking care of  personal grooming?: None Help from another person toileting, which includes using toliet, bedpan, or urinal?: A Little Help from another person bathing (including washing, rinsing, drying)?: A Little Help from another person to put on and taking off regular upper body clothing?: None Help from another person to put on and taking off regular lower body clothing?: None 6 Click Score: 22   End of Session Equipment Utilized During Treatment: Gait belt;Rolling Bedoy (2 wheels) Nurse Communication: Mobility status  Activity Tolerance: Patient tolerated treatment well Patient left: in chair;with call bell/phone within reach;with chair alarm set  OT Visit Diagnosis: Unsteadiness on feet (R26.81);Muscle weakness (generalized) (M62.81);Other abnormalities of gait and mobility (R26.89)                Time: 8681-8651 OT Time Calculation (min): 30 min Charges:  OT General Charges $OT Visit: 1 Visit OT Evaluation $OT Eval Low Complexity: 1 Low OT Treatments $Self Care/Home Management : 8-22 mins  Warrick POUR OTR/L  Acute Rehab Services  [phone] office number   Warrick Berber 07/26/2024, 1:57 PM

## 2024-07-26 NOTE — Evaluation (Addendum)
 Physical Therapy Evaluation Patient Details Name: Seth [name] MRN: [id number] DOB: March 25, 1956 Today's Date: 07/26/2024  History of Present Illness  68 y.o. male presents to [facility] 07/25/24 from [place] with nausea, loss of appetite and fatigue. Admitted with electrolyte disturbances. 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 where he had supervision when ambulating and for bathing. Pt reported current plan is to return to [place] and then to return home after getting home health services established. Pt is at functional mobility baseline requiring supervision for bed mobility, transfers, and gait with a RW. Deferring follow-up PT to [facility]. Acute PT signing off as pt has no further acute PT needs. Recommend pt continue mobilizing with mobility specialist and staff. Please re-consult if new needs arise.         If plan is discharge home, recommend the following: Assist for transportation;Help with stairs or ramp for entrance;A little help with walking and/or transfers;A little help with bathing/dressing/bathroom   Can travel by private vehicle   Yes    Equipment Recommendations None recommended by PT     Functional Status Assessment Patient has not had a recent decline in their functional status     Precautions / Restrictions Precautions Precautions: Fall Recall of Precautions/Restrictions: Intact Restrictions Weight Bearing Restrictions Per Provider Order: No      Mobility  Bed Mobility Overal bed mobility: Needs Assistance Bed Mobility: Supine to Sit    Supine to sit: Supervision    General bed mobility comments: supervision for safety with increased time    Transfers Overall transfer level: Needs assistance Equipment used: Rolling [place] (2 wheels) Transfers: Sit to/from Stand Sit to  Stand: Supervision    General transfer comment: for safety    Ambulation/Gait Ambulation/Gait assistance: Supervision Gait Distance (Feet): 200 Feet Assistive device: Rolling Christians (2 wheels) Gait Pattern/deviations: Step-through pattern, Decreased stride length, Decreased step length - left, Decreased weight shift to left Gait velocity: decr     General Gait Details: slightly decreased weight-shift and step length on LLE, likely due to prior L transmetatarsal amputation    Balance Overall balance assessment: Needs assistance Sitting-balance support: Feet supported, No upper extremity supported Sitting balance-[name] Scale: Good    Standing balance support: No upper extremity supported Standing balance-[name] Scale: Fair Standing balance comment: Able to stand statically at the sink with no UE support while performing ADLs. RW for gait        Pertinent Vitals/Pain Pain Assessment Pain Assessment: No/denies pain    Home Living Family/patient expects to be discharged to:: Skilled nursing facility    Additional Comments: At [facility] for the next few weeks. Will then d/c home with home health services    Prior Function Prior Level of Function : Needs assist    Mobility Comments: ambulates with RW with staff close by for supervision ADLs Comments: supervision when bathing, staff assisting with medication management     Extremity/Trunk Assessment   Upper Extremity Assessment Upper Extremity Assessment: Defer to OT evaluation    Lower Extremity Assessment Lower Extremity Assessment: LLE deficits/detail LLE Deficits / Details: prior L transmetatarsal amputation LLE Sensation: WNL    Cervical / Trunk Assessment Cervical / Trunk Assessment: Normal  Communication   Communication Communication: No apparent difficulties    Cognition Arousal: Alert Behavior During Therapy: WFL for tasks assessed/performed   PT - Cognitive impairments: No family/caregiver present to  determine baseline, Orientation   Orientation impairments: Time (unable to recall month)    PT - Cognition Comments: A&Ox3, Not formally tested, however, appropriate during session Following commands: Intact       Cueing Cueing Techniques: Verbal cues, Tactile cues     General Comments General comments (skin integrity, edema, etc.): VSS on RA     PT Assessment All further PT needs can be met in the next venue of care  PT Problem List Decreased activity tolerance;Decreased balance;Decreased mobility           PT Goals (Current goals can be found in the Care Plan section)  Acute Rehab PT Goals PT Goal Formulation: All assessment and education complete, DC therapy     AM-PAC PT 6 Clicks Mobility  Outcome Measure Help needed turning from your back to your side while in a flat bed without using bedrails?: None Help needed moving from lying on your back to sitting on the side of a flat bed without using bedrails?: A Little Help needed moving to and from a bed to a chair (including a wheelchair)?: A Little Help needed standing up from a chair using your arms (e.g., wheelchair or bedside chair)?: A Little Help needed to walk in hospital room?: A Little Help needed climbing 3-5 steps with a railing? : A Lot 6 Click Score: 18    End of Session Equipment Utilized During Treatment: Gait belt Activity Tolerance: Patient tolerated treatment well Patient left: in chair;with call bell/phone within reach;with chair alarm set Nurse Communication: Mobility status PT Visit Diagnosis: Other abnormalities of gait and mobility (R26.89);Unsteadiness on feet (R26.81)    Time: 8880-8854 PT Time Calculation (min) (ACUTE ONLY): 26 min   Charges:   PT Evaluation $PT Eval Low Complexity: 1 Low   PT General Charges $$ ACUTE PT VISIT: 1 Visit       Kate ORN, PT, DPT Secure Chat Preferred  Rehab Office [phone]   Kate BRAVO Wendolyn 07/26/2024, 1:03 PM

## 2024-07-26 NOTE — Care Management Obs Status (Signed)
 MEDICARE OBSERVATION STATUS NOTIFICATION   Patient Details  Name: Seth [name] MRN: [id number] Date of Birth: [birth year]   Medicare Observation Status Notification Given:  Yes    Donnisha Besecker G., RN 07/26/2024, 9:05 AM

## 2024-07-27 ENCOUNTER — Other Ambulatory Visit: Payer: Self-pay

## 2024-07-27 DIAGNOSIS — E878 Other disorders of electrolyte and fluid balance, not elsewhere classified: Secondary | ICD-10-CM | POA: Diagnosis not present

## 2024-07-27 LAB — CBC
HCT: 40.9 % (ref 39.0–52.0)
Hemoglobin: 13.2 g/dL (ref 13.0–17.0)
MCH: 27.9 pg (ref 26.0–34.0)
MCHC: 32.3 g/dL (ref 30.0–36.0)
MCV: 86.5 fL (ref 80.0–100.0)
Platelets: 201 K/uL (ref 150–400)
RBC: 4.73 MIL/uL (ref 4.22–5.81)
RDW: 15.9 % — ABNORMAL HIGH (ref 11.5–15.5)
WBC: 7.2 K/uL (ref 4.0–10.5)
nRBC: 0 % (ref 0.0–0.2)

## 2024-07-27 LAB — BASIC METABOLIC PANEL WITH GFR
Anion gap: 12 (ref 5–15)
BUN: 15 mg/dL (ref 8–23)
CO2: 23 mmol/L (ref 22–32)
Calcium: 8.8 mg/dL — ABNORMAL LOW (ref 8.9–10.3)
Chloride: 102 mmol/L (ref 98–111)
Creatinine, Ser: 1.1 mg/dL (ref 0.61–1.24)
GFR, Estimated: >60 mL/min (ref >60)
Glucose, Bld: 82 mg/dL (ref 70–99)
Potassium: 4.4 mmol/L (ref 3.5–5.1)
Sodium: 137 mmol/L (ref 135–145)

## 2024-07-27 MED ORDER — CALCIUM CARBONATE 1250 (500 CA) MG PO TABS: 1.0000 | ORAL_TABLET | Freq: Every day | ORAL | Status: DC

## 2024-07-27 MED ORDER — CALCIUM CARBONATE 1250 (500 CA) MG PO TABS: 1.0000 | ORAL_TABLET | Freq: Every day | ORAL | 0 refills | Status: AC

## 2024-07-27 NOTE — Discharge Summary (Signed)
 Physician Discharge Summary   Patient: Seth [name] MRN: [id number] DOB: 12-02-1955  Admit date:     07/25/2024  Discharge date: 07/27/24  Discharge Physician: Owen DELENA Lore   PCP: [name], NP   Recommendations at discharge:   Monitor electrolytes as needed.   Discharge Diagnoses: Principal Problem:   Electrolyte disturbance Active Problems:   Seizures (HCC)   COPD (chronic obstructive pulmonary disease) (HCC)   History of CVA with residual RUE weakness   Hypoglycemia   Essential hypertension  Resolved Problems:   * No resolved hospital problems. *  Hospital Course: 68 year old with past medical history significant for traumatic SDH, history of CVA, seizure, hypertension, COPD presents with nausea, loss of appetite and fatigue.  Symptoms started the night prior to admission.  He denies abdominal pain, fever or diarrhea.   Evaluation in the ED: PMH notable for bicarb of 16, glucose 68, magnesium  1.4, calcium  5.9, albumin  2.2.  Normal liver function test and lipase.  No acute findings CT abdomen and pelvis.   He was treated with IV magnesium , IV calcium  gluconate, amp of D50 and potassium    Assessment and Plan: 1-Electrolytes disturbance:  Hypomagnesemia, metabolic acidosis, hypocalcemia: - He presents with hypocalcemia, hypomagnesemia hypoglycemia, low serum bicarb. - Received IV calcium , IV magnesium  and IV dextrose . -Previous history of hypoglycemia thought to be related to phenobarbital . - Cortisol level: 3.3 yesterday night., check morning cortisol.  -electrolytes abnormalities improving.   -discharge on calcium  supplement.  Stable to go back to SNF  Hyperkalemia; hemolyses labs   Nausea:  -resolved. Tolerates breakfast.    Seizure disorder: - Continue Vimpat , Keppra , gabapentin  Phenobarbital      History of CVA: -Continue aspirin  and Crestor      Hypertension: - Continue Norvasc , Coreg , holding lisinopril  due to electrolytes abnormalities.     COPD: - As needed nebulizer orders                Consultants: None Procedures performed: None  Disposition: Skilled nursing facility Diet recommendation:  Discharge Diet Orders (From admission, onward)     Start     Ordered   07/27/24 0000  Diet - low sodium heart healthy        07/27/24 1036           Regular diet DISCHARGE MEDICATION: Allergies as of 07/27/2024   No Known Allergies      Medication List     STOP taking these medications    lisinopril  10 MG tablet Commonly known as: ZESTRIL        TAKE these medications    amLODipine  10 MG tablet Commonly known as: NORVASC  Take 1 tablet (10 mg total) by mouth daily.   aspirin  EC 81 MG tablet Take 1 tablet (81 mg total) by mouth daily. Swallow whole.   carvedilol  6.25 MG tablet Commonly known as: COREG  Take 6.25 mg by mouth in the morning and at bedtime.   gabapentin  300 MG capsule Commonly known as: NEURONTIN  Take 1 capsule (300 mg total) by mouth 3 (three) times daily.   lacosamide  200 MG Tabs tablet Commonly known as: VIMPAT  Take 1 tablet (200 mg total) by mouth 2 (two) times daily.   levETIRAcetam  1000 MG tablet Commonly known as: KEPPRA  Take 2 tablets (2,000 mg total) by mouth 2 (two) times daily.   omeprazole 40 MG capsule Commonly known as: PRILOSEC Take 40 mg by mouth daily.   PHENobarbital  64.8 MG tablet Commonly known as: LUMINAL Take 1 tablet (64.8 mg total) by mouth  2 (two) times daily.   rosuvastatin  20 MG tablet Commonly known as: CRESTOR  Take 1 tablet (20 mg total) by mouth daily.        Follow-up Information     Call  [name], NP.   Why: Follow up from ER visit Contact information: [address] [place] [address] [phone]         Go to  [facility] Emergency Department at [facility].   Specialty: Emergency Medicine Why: As needed, If symptoms worsen Contact information: [address] [place]   [address] [phone]               Discharge Exam: Fredricka Weights   07/25/24 1110  Weight: 79.4 kg   General; NAD  Condition at discharge: stable  The results of significant diagnostics from this hospitalization (including imaging, microbiology, ancillary and laboratory) are listed below for reference.   Imaging Studies: CT ABDOMEN PELVIS W CONTRAST Result Date: 07/25/2024 EXAM: CT ABDOMEN AND PELVIS WITH CONTRAST 07/25/2024 04:17:39 PM TECHNIQUE: CT of the abdomen and pelvis was performed with the administration of 75 mL of iohexol  (OMNIPAQUE ) 350 MG/ML injection. Multiplanar reformatted images are provided for review. Automated exposure control, iterative reconstruction, and/or weight-based adjustment of the mA/kV was utilized to reduce the radiation dose to as low as reasonably achievable. COMPARISON: Comparison with 05/19/2022. CLINICAL HISTORY: Abdominal pain, acute, nonlocalized; nausea. FINDINGS: LOWER CHEST: No acute abnormality. LIVER: The liver is unremarkable. GALLBLADDER AND BILE DUCTS: Gallbladder is unremarkable. No biliary ductal dilatation. SPLEEN: No acute abnormality. PANCREAS: No acute abnormality. ADRENAL GLANDS: No acute abnormality. KIDNEYS, URETERS AND BLADDER: No stones in the kidneys or ureters. No hydronephrosis. No perinephric or periureteral stranding. GI AND BOWEL: Stomach demonstrates no acute abnormality. Colonic diverticulosis without diverticulitis. Normal appendix. There is no bowel obstruction. PERITONEUM AND RETROPERITONEUM: No ascites. No free air. VASCULATURE: Aorta is normal in caliber with atherosclerotic calcification. LYMPH NODES: No lymphadenopathy. REPRODUCTIVE ORGANS: Enlarged prostate indenting the floor of the bladder. BONES AND SOFT TISSUES: No acute osseous abnormality. No focal soft tissue abnormality. IMPRESSION: 1. No acute findings in the abdomen or pelvis. 2. Enlarged prostate. Electronically signed by: Norman Gatlin MD 07/25/2024 04:48 PM  EST RP Workstation: [id number]   DG Chest Portable 1 View Result Date: 07/25/2024 CLINICAL DATA:  Weakness. EXAM: PORTABLE CHEST 1 VIEW COMPARISON:  03/18/2024. FINDINGS: Cardiac silhouette is normal in size. No mediastinal or hilar masses. Lungs are hyperexpanded. There is blunting of the lateral right costophrenic sulcus similar to prior studies. Lungs are clear. No convincing pleural effusion and no pneumothorax. Previous ORIF of a right humerus fracture. IMPRESSION: No acute cardiopulmonary disease. Electronically Signed   By: Alm Parkins M.D.   On: 07/25/2024 12:16    Microbiology: Results for orders placed or performed during the hospital encounter of 07/25/24  Resp panel by RT-PCR (RSV, Flu A&B, Covid) Anterior Nasal Swab     Status: None   Collection Time: 07/25/24 11:21 AM   Specimen: Anterior Nasal Swab  Result Value Ref Range Status   SARS Coronavirus 2 by RT PCR NEGATIVE NEGATIVE Final   Influenza A by PCR NEGATIVE NEGATIVE Final   Influenza B by PCR NEGATIVE NEGATIVE Final    Comment: (NOTE) The Xpert Xpress SARS-CoV-2/FLU/RSV plus assay is intended as an aid in the diagnosis of influenza from Nasopharyngeal swab specimens and should not be used as a sole basis for treatment. Nasal washings and aspirates are unacceptable for Xpert Xpress SARS-CoV-2/FLU/RSV testing.  Fact  Sheet for Patients: bloggercourse.com  Fact Sheet for Healthcare Providers: seriousbroker.it  This test is not yet approved or cleared by the [place]  FDA and has been authorized for detection and/or diagnosis of SARS-CoV-2 by FDA under an Emergency Use Authorization (EUA). This EUA will remain in effect (meaning this test can be used) for the duration of the COVID-19 declaration under Section 564(b)(1) of the Act, 21 U.S.C. section 360bbb-3(b)(1), unless the authorization is terminated or revoked.     Resp Syncytial Virus by PCR NEGATIVE  NEGATIVE Final    Comment: (NOTE) Fact Sheet for Patients: bloggercourse.com  Fact Sheet for Healthcare Providers: seriousbroker.it  This test is not yet approved or cleared by the [place]  FDA and has been authorized for detection and/or diagnosis of SARS-CoV-2 by FDA under an Emergency Use Authorization (EUA). This EUA will remain in effect (meaning this test can be used) for the duration of the COVID-19 declaration under Section 564(b)(1) of the Act, 21 U.S.C. section 360bbb-3(b)(1), unless the authorization is terminated or revoked.  Performed at [facility], [address] [place], [place] [address]     Labs: CBC: Recent Labs  Lab 07/25/24 1900 07/26/24 0607 07/27/24 0423  WBC 5.7 5.2 7.2  NEUTROABS 2.3  --   --   HGB 13.3 14.4 13.2  HCT 43.1 45.0 40.9  MCV 89.4 87.0 86.5  PLT 154 190 201   Basic Metabolic Panel: Recent Labs  Lab 07/25/24 1343 07/25/24 1641 07/25/24 2258 07/26/24 0858 07/26/24 1311 07/27/24 0423  NA 143  --  134* 137 139 137  K 3.7  --  5.5* 5.3* 4.7 4.4  CL 120*  --  101 105 105 102  CO2 16*  --  20* 21* 23 23  GLUCOSE 68*  --  84 73 70 82  BUN 11  --  12 10 14 15   CREATININE 0.60*  --  0.87 1.00 0.94 1.10  CALCIUM  5.9*  --  8.7* 8.9 8.9 8.8*  MG  --  1.4*  --  2.4  --   --   PHOS  --   --   --  3.6  --   --    Liver Function Tests: Recent Labs  Lab 07/25/24 1343  AST 20  ALT 10  ALKPHOS 73  BILITOT 0.7  PROT 4.3*  ALBUMIN  2.2*   CBG: Recent Labs  Lab 07/25/24 1108 07/25/24 1148 07/25/24 1304 07/25/24 1349  GLUCAP 65* 89 62* 77    Discharge time spent: greater than 30 minutes.  Signed: Owen DELENA Lore, MD [facility] 07/27/2024

## 2024-07-27 NOTE — Plan of Care (Signed)

## 2024-07-27 NOTE — TOC Transition Note (Signed)
 Transition of Care [facility]) - Discharge Note   Patient Details  Name: Seth [name] MRN: [id number] Date of Birth: 08-30-1955  Transition of Care [facility]) CM/SW Contact:  Gwenn Julien [place], [place] Phone Number: 07/27/2024, 11:02 AM   Clinical Narrative:  Pt for dc back to [facility]. Spoke to Tawanna at [facility] who confirmed pt is able to return. DC summary faxed to [facility] [phone] at [facility] request. Pt's dtr [name] aware of dc and reports agreeable. RN provided with number for report and PTAR arranged for transport. SW signing off at dc.   Julien Gwenn, MSW, LCSW [phone] (coverage)       Final next level of care: Assisted Living Barriers to Discharge: Barriers Resolved   Patient Goals and CMS Choice            Discharge Placement                Patient to be transferred to facility by: PTAR Name of family member notified: Seth [name] Patient and family notified of of transfer: 07/27/24  Discharge Plan and Services Additional resources added to the After Visit Summary for                                       Social Drivers of Health (SDOH) Interventions SDOH Screenings   Food Insecurity: No Food Insecurity (07/26/2024)  Housing: Low Risk  (07/26/2024)  Transportation Needs: No Transportation Needs (07/26/2024)  Utilities: Not At Risk (07/26/2024)  Depression (PHQ2-9): Low Risk  (08/23/2020)  Financial Resource Strain: Medium Risk (12/07/2017)  Social Connections: Socially Isolated (07/26/2024)  Stress: Stress Concern Present (12/07/2017)  Tobacco Use: High Risk (07/25/2024)     Readmission Risk Interventions    12/22/2022    4:22 PM  Readmission Risk Prevention Plan  Transportation Screening Complete  HRI or Home Care Consult Complete  Social Work Consult for Recovery Care Planning/Counseling Complete  Palliative Care Screening Not Applicable  Medication Review Oceanographer) Complete

## 2024-07-27 NOTE — Plan of Care (Signed)
  Problem: Education: Goal: Knowledge of General Education information will improve Description: Including pain rating scale, medication(s)/side effects and non-pharmacologic comfort measures 07/27/2024 1106 by Joshua Zorita CROME, RN Outcome: Adequate for Discharge 07/27/2024 1105 by Joshua Zorita CROME, RN Outcome: Adequate for Discharge   Problem: Health Behavior/Discharge Planning: Goal: Ability to manage health-related needs will improve 07/27/2024 1106 by Joshua Zorita CROME, RN Outcome: Adequate for Discharge 07/27/2024 1105 by Joshua Zorita CROME, RN Outcome: Adequate for Discharge   Problem: Clinical Measurements: Goal: Ability to maintain clinical measurements within normal limits will improve 07/27/2024 1106 by Joshua Zorita CROME, RN Outcome: Adequate for Discharge 07/27/2024 1105 by Joshua Zorita CROME, RN Outcome: Adequate for Discharge Goal: Will remain free from infection 07/27/2024 1106 by Joshua Zorita CROME, RN Outcome: Adequate for Discharge 07/27/2024 1105 by Joshua Zorita CROME, RN Outcome: Adequate for Discharge Goal: Diagnostic test results will improve 07/27/2024 1106 by Joshua Zorita CROME, RN Outcome: Adequate for Discharge 07/27/2024 1105 by Joshua Zorita CROME, RN Outcome: Adequate for Discharge Goal: Respiratory complications will improve 07/27/2024 1106 by Joshua Zorita CROME, RN Outcome: Adequate for Discharge 07/27/2024 1105 by Joshua Zorita CROME, RN Outcome: Adequate for Discharge Goal: Cardiovascular complication will be avoided 07/27/2024 1106 by Joshua Zorita CROME, RN Outcome: Adequate for Discharge 07/27/2024 1105 by Joshua Zorita CROME, RN Outcome: Adequate for Discharge   Problem: Activity: Goal: Risk for activity intolerance will decrease 07/27/2024 1106 by Joshua Zorita CROME, RN Outcome: Adequate for Discharge 07/27/2024 1105 by Joshua Zorita CROME, RN Outcome: Adequate for Discharge   Problem: Nutrition: Goal: Adequate nutrition will be maintained 07/27/2024 1106 by Joshua Zorita CROME, RN Outcome:  Adequate for Discharge 07/27/2024 1105 by Joshua Zorita CROME, RN Outcome: Adequate for Discharge   Problem: Coping: Goal: Level of anxiety will decrease 07/27/2024 1106 by Joshua Zorita CROME, RN Outcome: Adequate for Discharge 07/27/2024 1105 by Joshua Zorita CROME, RN Outcome: Adequate for Discharge   Problem: Elimination: Goal: Will not experience complications related to bowel motility 07/27/2024 1106 by Joshua Zorita CROME, RN Outcome: Adequate for Discharge 07/27/2024 1105 by Joshua Zorita CROME, RN Outcome: Adequate for Discharge Goal: Will not experience complications related to urinary retention 07/27/2024 1106 by Joshua Zorita CROME, RN Outcome: Adequate for Discharge 07/27/2024 1105 by Joshua Zorita CROME, RN Outcome: Adequate for Discharge   Problem: Pain Managment: Goal: General experience of comfort will improve and/or be controlled 07/27/2024 1106 by Joshua Zorita CROME, RN Outcome: Adequate for Discharge 07/27/2024 1105 by Joshua Zorita CROME, RN Outcome: Adequate for Discharge   Problem: Safety: Goal: Ability to remain free from injury will improve 07/27/2024 1106 by Joshua Zorita CROME, RN Outcome: Adequate for Discharge 07/27/2024 1105 by Joshua Zorita CROME, RN Outcome: Adequate for Discharge   Problem: Skin Integrity: Goal: Risk for impaired skin integrity will decrease 07/27/2024 1106 by Joshua Zorita CROME, RN Outcome: Adequate for Discharge 07/27/2024 1105 by Joshua Zorita CROME, RN Outcome: Adequate for Discharge

## 2024-07-28 LAB — LEVETIRACETAM LEVEL: Levetiracetam Lvl: 37.2 ug/mL (ref 10.0–40.0)

## 2024-07-28 LAB — CALCIUM, IONIZED: Calcium, Ionized, Serum: 5.1 mg/dL (ref 4.5–5.6)

## 2024-08-23 ENCOUNTER — Other Ambulatory Visit: Payer: Self-pay

## 2024-08-23 ENCOUNTER — Emergency Department (HOSPITAL_COMMUNITY)
Admission: EM | Admit: 2024-08-23 | Discharge: 2024-08-23 | Disposition: A | Discharge status: Home / Self Care | Attending: Emergency Medicine | Admitting: Emergency Medicine

## 2024-08-23 ENCOUNTER — Encounter (HOSPITAL_COMMUNITY): Payer: Self-pay | Admitting: Emergency Medicine

## 2024-08-23 DIAGNOSIS — R531 Weakness: Secondary | ICD-10-CM | POA: Insufficient documentation

## 2024-08-23 DIAGNOSIS — K5900 Constipation, unspecified: Secondary | ICD-10-CM | POA: Insufficient documentation

## 2024-08-23 DIAGNOSIS — Z8673 Personal history of transient ischemic attack (TIA), and cerebral infarction without residual deficits: Secondary | ICD-10-CM | POA: Diagnosis not present

## 2024-08-23 DIAGNOSIS — Z7982 Long term (current) use of aspirin: Secondary | ICD-10-CM | POA: Diagnosis not present

## 2024-08-23 DIAGNOSIS — I10 Essential (primary) hypertension: Secondary | ICD-10-CM | POA: Insufficient documentation

## 2024-08-23 DIAGNOSIS — Z79899 Other long term (current) drug therapy: Secondary | ICD-10-CM | POA: Insufficient documentation

## 2024-08-23 DIAGNOSIS — J449 Chronic obstructive pulmonary disease, unspecified: Secondary | ICD-10-CM | POA: Diagnosis not present

## 2024-08-23 DIAGNOSIS — R109 Unspecified abdominal pain: Secondary | ICD-10-CM | POA: Diagnosis present

## 2024-08-23 MED ORDER — POLYETHYLENE GLYCOL 3350 17 G PO PACK: 17.0000 g | PACK | Freq: Every day | ORAL | 0 refills | Status: AC | PRN

## 2024-08-23 MED ORDER — FLEET ENEMA RE ENEM
1.0000 | ENEMA | Freq: Once | RECTAL | Status: AC
  Administered 2024-08-23: 1 via RECTAL
  Filled 2024-08-23: qty 1

## 2024-08-23 NOTE — ED Notes (Signed)
 PTAR called and report to [facility] called as well

## 2024-08-23 NOTE — ED Notes (Signed)
 Pt placed on bedpan

## 2024-08-23 NOTE — ED Provider Notes (Signed)
 " Seth [name] EMERGENCY DEPARTMENT AT [facility] Provider Note   CSN: [id number] Arrival date & time: 08/23/24  9278     Patient presents with: No chief complaint on file.   Seth [name] is a 68 y.o. male.   The history is provided by the patient and medical records. No language interpreter was used.  Constipation Severity:  Moderate Time since last bowel movement:  3 days Timing:  Constant Progression:  Unchanged Chronicity:  New Stool description:  None produced Relieved by:  Nothing Worsened by:  Nothing Ineffective treatments:  None tried Associated symptoms: abdominal pain (mild but improved)   Associated symptoms: no back pain, no diarrhea, no dysuria, no fever, no nausea, no urinary retention and no vomiting        Prior to Admission medications  Medication Sig Start Date End Date Taking? Authorizing Provider  amLODipine  (NORVASC ) 10 MG tablet Take 1 tablet (10 mg total) by mouth daily. 06/12/23   Singh, Prashant K, MD  aspirin  EC 81 MG tablet Take 1 tablet (81 mg total) by mouth daily. Swallow whole. 06/12/23   Singh, Prashant K, MD  calcium  carbonate (OS-CAL - DOSED IN MG OF ELEMENTAL CALCIUM ) 1250 (500 Ca) MG tablet Take 1 tablet (1,250 mg total) by mouth daily with breakfast. 07/28/24   Regalado, Belkys A, MD  carvedilol  (COREG ) 6.25 MG tablet Take 6.25 mg by mouth in the morning and at bedtime. 09/19/23   [provider]  gabapentin  (NEURONTIN ) 300 MG capsule Take 1 capsule (300 mg total) by mouth 3 (three) times daily. 06/13/23 06/07/24  Gregg Lek, MD  lacosamide  (VIMPAT ) 200 MG TABS tablet Take 1 tablet (200 mg total) by mouth 2 (two) times daily. 05/08/24 02/02/25  Camara, Amadou, MD  levETIRAcetam  (KEPPRA ) 1000 MG tablet Take 2 tablets (2,000 mg total) by mouth 2 (two) times daily. 06/13/23 06/07/24  Camara, Amadou, MD  omeprazole (PRILOSEC) 40 MG capsule Take 40 mg by mouth daily. 10/09/22   [provider]  PHENobarbital   (LUMINAL) 64.8 MG tablet Take 1 tablet (64.8 mg total) by mouth 2 (two) times daily. 04/22/24 04/17/25  Camara, Amadou, MD  rosuvastatin  (CRESTOR ) 20 MG tablet Take 1 tablet (20 mg total) by mouth daily. 01/02/24   Gabino Boga, MD    Allergies: Patient has no known allergies.    Review of Systems  Constitutional:  Negative for chills, fatigue and fever.  HENT:  Negative for congestion.   Eyes:  Negative for visual disturbance.  Respiratory:  Negative for cough, chest tightness, shortness of breath and wheezing.   Cardiovascular:  Negative for chest pain and palpitations.  Gastrointestinal:  Positive for abdominal pain (mild but improved) and constipation. Negative for abdominal distention, anal bleeding, blood in stool, diarrhea, nausea, rectal pain and vomiting.  Genitourinary:  Negative for dysuria and flank pain.  Musculoskeletal:  Negative for back pain, neck pain and neck stiffness.  Skin:  Negative for rash and wound.  Neurological:  Negative for seizures, numbness and headaches.  Psychiatric/Behavioral:  Negative for agitation and confusion.   All other systems reviewed and are negative.   Updated Vital Signs BP 112/73 (BP Location: Left Arm)   Pulse 72   Temp (!) 97.3 F (36.3 C) (Oral)   Resp 16   SpO2 93%   Physical Exam Vitals and nursing note reviewed.  Constitutional:      General: He is not in acute distress.    Appearance: He is well-developed. He is not ill-appearing, toxic-appearing  or diaphoretic.  HENT:     Head: Normocephalic and atraumatic.     Nose: No congestion or rhinorrhea.     Mouth/Throat:     Mouth: Mucous membranes are moist.     Pharynx: No oropharyngeal exudate or posterior oropharyngeal erythema.  Eyes:     Extraocular Movements: Extraocular movements intact.     Conjunctiva/sclera: Conjunctivae normal.     Pupils: Pupils are equal, round, and reactive to light.  Cardiovascular:     Rate and Rhythm: Normal rate and regular rhythm.      Heart sounds: No murmur heard. Pulmonary:     Effort: Pulmonary effort is normal. No respiratory distress.     Breath sounds: Normal breath sounds. No wheezing, rhonchi or rales.  Chest:     Chest wall: No tenderness.  Abdominal:     Palpations: Abdomen is soft.     Tenderness: There is no abdominal tenderness. There is no right CVA tenderness, left CVA tenderness, guarding or rebound.  Genitourinary:    Comments: refused Musculoskeletal:        General: No swelling or tenderness.     Cervical back: Neck supple.  Skin:    General: Skin is warm and dry.     Capillary Refill: Capillary refill takes less than 2 seconds.     Findings: No erythema or rash.  Neurological:     General: No focal deficit present.     Mental Status: He is alert.     Sensory: No sensory deficit.     Motor: No weakness.  Psychiatric:        Mood and Affect: Mood normal.     (all labs ordered are listed, but only abnormal results are displayed) Labs Reviewed - No data to display  EKG: None  Radiology: No results found.   Procedures   Medications Ordered in the ED  sodium phosphate  (FLEET) enema 1 enema (1 enema Rectal Given 08/23/24 1050)                                    Medical Decision Making Risk OTC drugs.   Seth [name] is a 68 y.o. male with a past medical history significant for COPD, previous stroke, hypertension, GERD, and seizures who presents for constipation.  According to patient, there was a problem with his toilet near his room at his facility and so he did not poop for the last 3 days.  He reports that he had some cramping in his abdomen but that has resolved.  He denies any urinary difficulties.  Denies fevers, chills, congestion, cough, nausea, vomiting.  Denies any trauma.  Reports he normally does not have constipation troubles and has a bowel movement every day.  On exam, lungs clear.  Chest nontender.  No significant tenderness on my exam.  No abdominal distention.   Back nontender.  Flanks nontender.  Patient otherwise resting and well-appearing.  Patient refused a rectal exam and did not want me to see if he has impaction.  He would rather try to have bowel movement on his own.  Patient cannot have a bowel movement initially and requested an enema.  He does not want CT or labs at this time and wants to try the enema.  Anticipate reassessment if he has a bowel movement will plan for discharge.  1:01 PM Patient had a bowel movement and feels back to normal.  He does not  want any workup and would like to go home.  We will give him a prescription for MiraLAX  to help him with his bowels and he can follow-up with his PCP.  He no other questions or concerns and was discharged in good condition with resolution of his difficulty with bowel movements.       Final diagnoses:  Constipation, unspecified constipation type    ED Discharge Orders          Ordered    polyethylene glycol (MIRALAX ) 17 g packet  Daily PRN        08/23/24 1302            Clinical Impression: 1. Constipation, unspecified constipation type     Disposition: Discharge  Condition: Good  I have discussed the results, Dx and Tx plan with the pt(& family if present). He/she/they expressed understanding and agree(s) with the plan. Discharge instructions discussed at great length. Strict return precautions discussed and pt &/or family have verbalized understanding of the instructions. No further questions at time of discharge.    New Prescriptions   POLYETHYLENE GLYCOL (MIRALAX ) 17 G PACKET    Take 17 g by mouth daily as needed for moderate constipation.    Follow Up: [name], NP [address] [place] [address] [phone]     [facility] Emergency Department at [facility] [address] [place]  [address] [phone]         Vipul Cafarelli, Lonni PARAS, MD 08/23/24 1355  "

## 2024-08-23 NOTE — ED Triage Notes (Signed)
 Pt BIb PTAR from [facility] at [place] for constipation x 3 days.  Toilet in facility hasn't been working for 3 days so he has been holding it in.  No distention or tenderness.  Endorsed Nausea and generalized weakness. No pain endorsed at this time. A&O x 4.  178/82 HR 77 94% RA, CBG 90

## 2024-08-23 NOTE — Discharge Instructions (Addendum)
 Your history, exam, and evaluation today were consistent with some constipation leading to your difficulty having a bowel movement however you are able to have a bowel movement here after an enema and your symptoms are relieved.  We had a shared decision making conversation and agreed to hold on extensive lab or imaging workup today.  We also offered to do a rectal exam in case there was some impaction however you preferred to have the enema instead.  We agree with plan for discharge home and have you follow-up with your primary doctor.  Please consider taking the MiraLAX  for the next few days as needed to help with your bowels.  If any symptoms change or worsen acutely, please return to the nearest emergency department.  Please try not to hold your stool for multiple days without a bowel movement as this can lead to more constipation.

## 2024-09-09 ENCOUNTER — Ambulatory Visit: Admitting: Podiatry

## 2024-09-09 DIAGNOSIS — L97522 Non-pressure chronic ulcer of other part of left foot with fat layer exposed: Secondary | ICD-10-CM

## 2024-09-09 DIAGNOSIS — L03119 Cellulitis of unspecified part of limb: Secondary | ICD-10-CM

## 2024-09-09 DIAGNOSIS — L02619 Cutaneous abscess of unspecified foot: Secondary | ICD-10-CM | POA: Diagnosis not present

## 2024-09-09 DIAGNOSIS — L97512 Non-pressure chronic ulcer of other part of right foot with fat layer exposed: Secondary | ICD-10-CM

## 2024-09-09 MED ORDER — AMOXICILLIN-POT CLAVULANATE 875-125 MG PO TABS: 1.0000 | ORAL_TABLET | Freq: Two times a day (BID) | ORAL | 0 refills | Status: AC

## 2024-09-09 MED ORDER — MUPIROCIN 2 % EX OINT: TOPICAL_OINTMENT | Freq: Two times a day (BID) | CUTANEOUS | Status: DC

## 2024-09-09 MED ORDER — MUPIROCIN 2 % EX OINT: 1.0000 | TOPICAL_OINTMENT | Freq: Two times a day (BID) | CUTANEOUS | 2 refills | Status: DC

## 2024-09-09 NOTE — Progress Notes (Signed)
 HPI: Patient presents for ulcer hallux right.  Says the toes been doing this for about 3 months now.  He has noticed some odors.  Has had some drainage occasionally purulence.  Denies N/V/F/Ch.   Objective:   Vascular:  DP pulses pedal pulses nonpalpable right.  Capillary refill time immediate bilaterally.  Mild edema lower extremity right   Neurologic: Decreased protective sensation feet bilaterally.  Some pain sensation toe right  Dermatologic: Full-thickness ulceration distal hallux right penetrating subcutaneous tissue.. Wound base devitalized and fibrous tissue..  Purulent exudate.  Distal aspect of the toe shows ischemic changes.  Purulent drainage was present. Predebridement ulcer measures 11 mm x 10 mm x 3 deep.  Musculoskeletal: Tenderness hallux right    Assessment:  Full-thickness Wagner grade 2 ulceration hallux right.  Plan:  -New patient office visit for evaluation and management.  Level 3.,  Modifier 25   -Patient was evaluated and treated and all questions answered.  Discussed with him concern about the ischemic ulcer and ischemic changes in the foot.  Discussed PAD.  He status post transmetatarsal amputation on the left.  Discussed with him possibility that the ulceration and ischemia could result in amputation.  Ulcer full-thickness penetrating subcutaneous tissue hallux right -Ulcer progress: Newly diagnosed ischemic ulcer hallux left -Debridement as below. -Dressed with antibiotic ointment and DSD. - Dispense   surgical shoe right for offloading .. -Wound care: Soak twice daily the luke warm epsom salt water  15 min . Apply Bactroban  ointment and dressing. -Rx Bactroban  apply twice daily to wound, refill x 2 -Rx Augmentin  XR 1 p.o. twice daily 10 days -Culture of wound taken and sent for analysis  -Order ABI testing for PAD and ischemic ulcer right foot  Procedure: Excisional Debridement of Wound Tool: Sharp #312 chisel blade/tissue nipper Type of  Debridement: Sharp Excisional Frequency: Every 1 to 2 weeks until appropriately healed.   Rationale: Removal of non-viable soft tissue from the wound to promote healing.  Anesthesia: none  Pre-Debridement Wound Measurements: 10 mm x 11 mm x 3 mm deep Post-Debridement Wound Measurements: 40 mm x 22 mm x 4 mm deep Area devitalized tissue removed(nonviable tissue only): 40 mm x 22 mm.  Blood loss: Minimal (<50cc) Depth of Debridement: Full-thickness into subcutaneous tissue  Description of tissue removed: Devitalized tissue Technique: Using #312 blade/tissue nipper sharp debridement of necrotic/nonviable tissue was performed until healthy bleeding wound bed at base of wound was achieved.  After debridement no nonviable was present in wound.  No underlying bone or tendon was exposed during debridement.  The wound was thoroughly irrigated with normal saline solution Dressing: Dry, sterile, compression dressing. Disposition: Patient tolerated procedure well.   Return 1 weeks f/u ulcer hallux right and radiographs right foot 3 views

## 2024-09-15 ENCOUNTER — Other Ambulatory Visit: Payer: Self-pay | Admitting: Podiatry

## 2024-09-16 ENCOUNTER — Ambulatory Visit (HOSPITAL_COMMUNITY)
Admission: RE | Admit: 2024-09-16 | Discharge: 2024-09-16 | Disposition: A | Discharge status: Home / Self Care | Source: Ambulatory Visit | Attending: Podiatry | Admitting: Podiatry

## 2024-09-16 ENCOUNTER — Other Ambulatory Visit: Payer: Self-pay | Admitting: Podiatry

## 2024-09-16 DIAGNOSIS — L97522 Non-pressure chronic ulcer of other part of left foot with fat layer exposed: Secondary | ICD-10-CM | POA: Insufficient documentation

## 2024-09-16 MED ORDER — LEVOFLOXACIN 750 MG PO TABS: 750.0000 mg | ORAL_TABLET | Freq: Every day | ORAL | 0 refills | Status: DC

## 2024-09-17 ENCOUNTER — Encounter: Payer: Self-pay | Admitting: Podiatry

## 2024-09-17 ENCOUNTER — Ambulatory Visit

## 2024-09-17 ENCOUNTER — Ambulatory Visit: Admitting: Podiatry

## 2024-09-17 DIAGNOSIS — L03115 Cellulitis of right lower limb: Secondary | ICD-10-CM | POA: Diagnosis not present

## 2024-09-17 DIAGNOSIS — M86171 Other acute osteomyelitis, right ankle and foot: Secondary | ICD-10-CM

## 2024-09-17 DIAGNOSIS — L02611 Cutaneous abscess of right foot: Secondary | ICD-10-CM | POA: Diagnosis not present

## 2024-09-17 DIAGNOSIS — L02619 Cutaneous abscess of unspecified foot: Secondary | ICD-10-CM

## 2024-09-17 DIAGNOSIS — L03119 Cellulitis of unspecified part of limb: Secondary | ICD-10-CM

## 2024-09-17 LAB — VAS US ABI WITH/WO TBI
Left ABI: 0.83
Right ABI: 0.66

## 2024-09-17 MED ORDER — LEVOFLOXACIN 750 MG PO TABS: 750.0000 mg | ORAL_TABLET | Freq: Every day | ORAL | 0 refills | Status: DC

## 2024-09-17 NOTE — Progress Notes (Signed)
 Patient presents for hallux ulcer right.  Says the toes much darker and does not look as good.  Has not noticed any drainage.  No fever chills nausea or vomiting.  Physical Exam:  Patient alert and oriented x 3.  No complaints of nausea, vomiting, fever, or chills  Vascular: DP pulses 0/4 right. PT pulses 0/4 right l.  Mild edema lower extremity.   Dermatologic: Dry gangrenous hallux right.  Distal aspect of hallux right is mummified.  Appears to be demarcated around the first metatarsal phalangeal joint right.  Significant deterioration since last visit.    Neurologic:   Musculoskeletal:  Radiographs: 3 views right foot: Loss of cortical definition and lucencies noted in the distal phalanx.  Probably indicating osteomyelitis.  Somewhat difficult to say with the soft tissue being very dense and interfering with imaging.  Subcutaneous emphysema in the foot  Diagnoses: 1.  Gangrene hallux right. 2.  Osteomyelitis hallux right  Plan: - Established visit for evaluation and management level 3 - discussed with patient and his caregiver the gangrenous state of the hallux.  Toe has deteriorated greatly in the past week.  There is bone infection but the toe will need to be amputated will have him continue the levofloxacin  and the Augmentin  as prescribed.  Will set him up with Dr. Malvin for amputation hallux right secondary to gangrene. -Continue wound care as before soaking twice daily applying Bactroban  and a light dressing  Schedule surgical consult ASAP with Dr. Malvin for amputation hallux right

## 2024-09-18 ENCOUNTER — Telehealth: Payer: Self-pay | Admitting: Lab

## 2024-09-18 ENCOUNTER — Other Ambulatory Visit: Payer: Self-pay | Admitting: Podiatry

## 2024-09-18 DIAGNOSIS — I96 Gangrene, not elsewhere classified: Secondary | ICD-10-CM

## 2024-09-18 NOTE — Progress Notes (Signed)
 Referral to vascular

## 2024-09-18 NOTE — Telephone Encounter (Signed)
 Nurse from assisted living states that they can do dressing from M-F as directed but being that the facility is an assisted living there is no nurse on weekends. Patien has appointment schedule next week and just wanted provider to be informed.

## 2024-09-19 ENCOUNTER — Encounter: Payer: Self-pay | Admitting: Physician Assistant

## 2024-09-19 ENCOUNTER — Ambulatory Visit: Attending: Vascular Surgery | Admitting: Physician Assistant

## 2024-09-19 VITALS — BP 104/67 | HR 81 | Temp 98.0°F | Ht 75.0 in | Wt 175.0 lb

## 2024-09-19 DIAGNOSIS — I70235 Atherosclerosis of native arteries of right leg with ulceration of other part of foot: Secondary | ICD-10-CM

## 2024-09-19 DIAGNOSIS — I739 Peripheral vascular disease, unspecified: Secondary | ICD-10-CM | POA: Diagnosis not present

## 2024-09-19 NOTE — Progress Notes (Signed)
 "   Office Note   History of Present Illness   Seth [name] is a 69 y.o. (10-02-1955) male who presents for urgent follow-up.  He has a history of left lower extremity angiogram with left above and below-knee popliteal artery laser atherectomy, left below-knee popliteal artery balloon angioplasty, and left above-knee popliteal artery stenting by Dr. Sheree on 09/20/2020.  This was done for critical limb ischemia with tissue loss, and he ultimately required left TMA with Dr. Harden on 09/22/2020.  He then required repeat left lower extremity angiogram with anterior tibial artery laser atherectomy and SFA balloon angioplasty and stenting on 10/12/2021 by Dr. Lanis.  His TMA eventually healed after debridement of the area on 10/14/2021.  He returns today for urgent follow-up.  He was referred to our clinic by his podiatrist.  The patient notes he developed a sore on his right great toe a couple of weeks ago.  Over time this sore has gotten larger and more painful.  He does endorse purulent drainage.  He denies any fevers or chills.  He denies any rest pain.  He denies any tissue loss on the left.   He takes a daily aspirin  and statin. Current Outpatient Medications  Medication Sig Dispense Refill   amLODipine  (NORVASC ) 10 MG tablet Take 1 tablet (10 mg total) by mouth daily. 30 tablet 0   amoxicillin -clavulanate (AUGMENTIN  XR) 1000-62.5 MG 12 hr tablet Take 2 tablets by mouth 2 (two) times daily.     amoxicillin -clavulanate (AUGMENTIN ) 875-125 MG tablet Take 1 tablet by mouth 2 (two) times daily for 10 days. 20 tablet 0   aspirin  EC 81 MG tablet Take 1 tablet (81 mg total) by mouth daily. Swallow whole. 30 tablet 0   calcium  carbonate (OS-CAL - DOSED IN MG OF ELEMENTAL CALCIUM ) 1250 (500 Ca) MG tablet Take 1 tablet (1,250 mg total) by mouth daily with breakfast. 30 tablet 0   carvedilol  (COREG ) 6.25 MG tablet Take 6.25 mg by mouth in the morning and at bedtime.     gabapentin  (NEURONTIN ) 300 MG capsule  Take 1 capsule (300 mg total) by mouth 3 (three) times daily. 270 capsule 3   lacosamide  (VIMPAT ) 200 MG TABS tablet Take 1 tablet (200 mg total) by mouth 2 (two) times daily. 180 tablet 2   levETIRAcetam  (KEPPRA ) 1000 MG tablet Take 2 tablets (2,000 mg total) by mouth 2 (two) times daily. 360 tablet 3   levofloxacin  (LEVAQUIN ) 750 MG tablet Take 1 tablet (750 mg total) by mouth daily for 10 days. 10 tablet 0   mupirocin  ointment (BACTROBAN ) 2 % Apply 1 Application topically 2 (two) times daily. 30 g 2   omeprazole (PRILOSEC) 40 MG capsule Take 40 mg by mouth daily.     PHENobarbital  (LUMINAL) 64.8 MG tablet Take 1 tablet (64.8 mg total) by mouth 2 (two) times daily. 180 tablet 3   polyethylene glycol (MIRALAX ) 17 g packet Take 17 g by mouth daily as needed for moderate constipation. 14 each 0   rosuvastatin  (CRESTOR ) 20 MG tablet Take 1 tablet (20 mg total) by mouth daily.     No current facility-administered medications for this visit.    REVIEW OF SYSTEMS (negative unless checked):   Cardiac:  []  Chest pain or chest pressure? []  Shortness of breath upon activity? []  Shortness of breath when lying flat? []  Irregular heart rhythm?  Vascular:  []  Pain in calf, thigh, or hip brought on by walking? []  Pain in feet at night that wakes  you up from your sleep? []  Blood clot in your veins? []  Leg swelling?  Pulmonary:  []  Oxygen at home? []  Productive cough? []  Wheezing?  Neurologic:  []  Sudden weakness in arms or legs? []  Sudden numbness in arms or legs? []  Sudden onset of difficult speaking or slurred speech? []  Temporary loss of vision in one eye? []  Problems with dizziness?  Gastrointestinal:  []  Blood in stool? []  Vomited blood?  Genitourinary:  []  Burning when urinating? []  Blood in urine?  Psychiatric:  []  Major depression  Hematologic:  []  Bleeding problems? []  Problems with blood clotting?  Dermatologic:  []  Rashes or ulcers?  Constitutional:  []  Fever or  chills?  Ear/Nose/Throat:  []  Change in hearing? []  Nose bleeds? []  Sore throat?  Musculoskeletal:  []  Back pain? []  Joint pain? []  Muscle pain?   Physical Examination   Vitals:   09/19/24 1040  BP: 104/67  Pulse: 81  Temp: 98 F (36.7 C)  SpO2: 94%  Weight: 175 lb (79.4 kg)  Height: 6' 3 (1.905 m)   Body mass index is 21.87 kg/m.  General:  WDWN in NAD; vital signs documented above Gait: Not observed HENT: WNL, normocephalic Pulmonary: normal non-labored breathing  Cardiac: regular Abdomen: soft, NT, no masses Skin: without rashes Vascular Exam/Pulses: Palpable femoral pulses bilaterally.  Brisk left ATA Doppler signal.  Nonpalpable right popliteal or pedal pulses.  Monophasic right DP/PT Doppler signals. Extremities: Bandaged right great toe.  Well-healed left TMA Musculoskeletal: no muscle wasting or atrophy  Neurologic: A&O X 3;  No focal weakness or paresthesias are detected Psychiatric:  The pt has normal affect  Non-Invasive Vascular imaging   ABI (09/16/2024) R:  ABI: 0.66 (0.99),  PT: mono DP: mono TBI:  0 L:  ABI: 0.83 (0.61),  PT: tri DP: tri TBI: amp   Medical Decision Making   Seth [name] is a 69 y.o. male who presents for urgent follow-up  The patient has a history of left lower extremity revascularization including left SFA stenting in 2023 for critical limb ischemia with tissue loss, ultimately resulting in a left TMA.  He was urgently referred back to our clinic by his podiatrist for an ischemic ulceration on the right great toe. The patient states he developed a sore on his right great toe a couple of weeks ago and this has gotten larger and more painful with time.  He endorses purulent drainage.  He denies any fevers or chills.  He denies any rest pain or claudication.  He denies any wounds on the left. The patient's ABIs on the right have significantly decreased from 0.99 to 0.66.  He has a toe pressure of 0.  His ABIs on the left  have improved at 0.83. On exam the patient has palpable femoral pulses bilaterally.  He has a brisk left ATA Doppler signal.  He has soft monophasic right DP/PT Doppler signals.  He has nonpalpable right popliteal and pedal pulses.  He has an ischemic ulceration to his right great toe. I have explained to the patient that he has critical limb ischemia with tissue loss on the right.  He has a toe pressure of 0 which is not adequate for healing.  He will require angiography with possible intervention to improve his blood flow in hopes of healing his wound and/or future amputation needs.  Given his exam, he likely has SFA/popliteal and tibial disease.  The patient is agreeable for angiogram.  We can schedule the patient for right lower extremity  angiogram with possible SFA/popliteal and/or tibial intervention in the next couple of weeks with Dr. Sheree or Dr. Lanis The patient is at a facility and they will continue to take care of his toe as recommended by his podiatrist   Ahmed Holster PA-C [facility] Office: [phone]  Clinic MD: [place]  "

## 2024-09-22 ENCOUNTER — Ambulatory Visit: Admitting: Neurology

## 2024-09-23 ENCOUNTER — Other Ambulatory Visit: Payer: Self-pay

## 2024-09-23 ENCOUNTER — Ambulatory Visit: Admitting: Podiatry

## 2024-09-23 ENCOUNTER — Inpatient Hospital Stay (HOSPITAL_COMMUNITY)
Admission: EM | Admit: 2024-09-23 | Source: Skilled Nursing Facility | Attending: Internal Medicine | Admitting: Internal Medicine

## 2024-09-23 ENCOUNTER — Emergency Department (HOSPITAL_COMMUNITY)

## 2024-09-23 DIAGNOSIS — L089 Local infection of the skin and subcutaneous tissue, unspecified: Principal | ICD-10-CM

## 2024-09-23 DIAGNOSIS — I96 Gangrene, not elsewhere classified: Secondary | ICD-10-CM | POA: Diagnosis present

## 2024-09-23 DIAGNOSIS — I739 Peripheral vascular disease, unspecified: Secondary | ICD-10-CM | POA: Diagnosis present

## 2024-09-23 DIAGNOSIS — I70235 Atherosclerosis of native arteries of right leg with ulceration of other part of foot: Secondary | ICD-10-CM

## 2024-09-23 DIAGNOSIS — Z743 Need for continuous supervision: Secondary | ICD-10-CM | POA: Diagnosis not present

## 2024-09-23 DIAGNOSIS — I70221 Atherosclerosis of native arteries of extremities with rest pain, right leg: Secondary | ICD-10-CM

## 2024-09-23 DIAGNOSIS — L03039 Cellulitis of unspecified toe: Secondary | ICD-10-CM | POA: Diagnosis not present

## 2024-09-23 LAB — CBC WITH DIFFERENTIAL/PLATELET
Abs Immature Granulocytes: 0.06 10*3/uL (ref 0.00–0.07)
Basophils Absolute: 0.1 10*3/uL (ref 0.0–0.1)
Basophils Relative: 1 %
Eosinophils Absolute: 0.2 10*3/uL (ref 0.0–0.5)
Eosinophils Relative: 3 %
HCT: 44.8 % (ref 39.0–52.0)
Hemoglobin: 13.7 g/dL (ref 13.0–17.0)
Immature Granulocytes: 1 %
Lymphocytes Relative: 21 %
Lymphs Abs: 1.9 10*3/uL (ref 0.7–4.0)
MCH: 28.1 pg (ref 26.0–34.0)
MCHC: 30.6 g/dL (ref 30.0–36.0)
MCV: 91.8 fL (ref 80.0–100.0)
Monocytes Absolute: 1.1 10*3/uL — ABNORMAL HIGH (ref 0.1–1.0)
Monocytes Relative: 12 %
Neutro Abs: 5.8 10*3/uL (ref 1.7–7.7)
Neutrophils Relative %: 62 %
Platelets: 391 10*3/uL (ref 150–400)
RBC: 4.88 MIL/uL (ref 4.22–5.81)
RDW: 14.6 % (ref 11.5–15.5)
WBC: 9.1 10*3/uL (ref 4.0–10.5)
nRBC: 0 % (ref 0.0–0.2)

## 2024-09-23 LAB — COMPREHENSIVE METABOLIC PANEL WITH GFR
ALT: 9 U/L (ref 0–44)
AST: 19 U/L (ref 15–41)
Albumin: 3.6 g/dL (ref 3.5–5.0)
Alkaline Phosphatase: 117 U/L (ref 38–126)
Anion gap: 11 (ref 5–15)
BUN: 14 mg/dL (ref 8–23)
CO2: 25 mmol/L (ref 22–32)
Calcium: 9.9 mg/dL (ref 8.9–10.3)
Chloride: 102 mmol/L (ref 98–111)
Creatinine, Ser: 1.09 mg/dL (ref 0.61–1.24)
GFR, Estimated: >60 mL/min
Glucose, Bld: 81 mg/dL (ref 70–99)
Potassium: 4.6 mmol/L (ref 3.5–5.1)
Sodium: 138 mmol/L (ref 135–145)
Total Bilirubin: 0.3 mg/dL (ref 0.0–1.2)
Total Protein: 7.3 g/dL (ref 6.5–8.1)

## 2024-09-23 LAB — C-REACTIVE PROTEIN: CRP: 14.9 mg/dL — ABNORMAL HIGH

## 2024-09-23 LAB — I-STAT CG4 LACTIC ACID, ED: Lactic Acid, Venous: 1.1 mmol/L (ref 0.5–1.9)

## 2024-09-23 MED ORDER — ALBUTEROL SULFATE (2.5 MG/3ML) 0.083% IN NEBU: 2.5000 mg | INHALATION_SOLUTION | RESPIRATORY_TRACT | Status: AC | PRN

## 2024-09-23 MED ORDER — PANTOPRAZOLE SODIUM 40 MG PO TBEC
40.0000 mg | DELAYED_RELEASE_TABLET | Freq: Every day | ORAL | Status: AC
  Administered 2024-09-24 – 2024-10-03 (×10): 40 mg via ORAL
  Filled 2024-09-23 (×10): qty 1

## 2024-09-23 MED ORDER — VITAMIN D 25 MCG (1000 UNIT) PO TABS
1000.0000 [IU] | ORAL_TABLET | Freq: Every day | ORAL | Status: AC
  Administered 2024-09-24 – 2024-10-03 (×10): 1000 [IU] via ORAL
  Filled 2024-09-23 (×10): qty 1

## 2024-09-23 MED ORDER — ONDANSETRON HCL 4 MG PO TABS
4.0000 mg | ORAL_TABLET | Freq: Four times a day (QID) | ORAL | Status: AC | PRN
  Administered 2024-09-24: 4 mg via ORAL
  Filled 2024-09-23: qty 1

## 2024-09-23 MED ORDER — VANCOMYCIN HCL 1500 MG/300ML IV SOLN
1500.0000 mg | Freq: Once | INTRAVENOUS | Status: AC
  Administered 2024-09-23: 1500 mg via INTRAVENOUS
  Filled 2024-09-23: qty 300

## 2024-09-23 MED ORDER — OXYCODONE HCL 5 MG PO TABS
5.0000 mg | ORAL_TABLET | ORAL | Status: DC | PRN
  Administered 2024-09-25: 5 mg via ORAL
  Filled 2024-09-23: qty 1

## 2024-09-23 MED ORDER — VANCOMYCIN HCL IN DEXTROSE 1-5 GM/200ML-% IV SOLN
1000.0000 mg | Freq: Two times a day (BID) | INTRAVENOUS | Status: DC
  Administered 2024-09-24 – 2024-09-26 (×5): 1000 mg via INTRAVENOUS
  Filled 2024-09-23 (×5): qty 200

## 2024-09-23 MED ORDER — ACETAMINOPHEN 650 MG RE SUPP: 650.0000 mg | Freq: Four times a day (QID) | RECTAL | Status: DC | PRN

## 2024-09-23 MED ORDER — ONDANSETRON HCL 4 MG/2ML IJ SOLN: 4.0000 mg | Freq: Four times a day (QID) | INTRAMUSCULAR | Status: AC | PRN

## 2024-09-23 MED ORDER — LEVETIRACETAM 500 MG PO TABS
2000.0000 mg | ORAL_TABLET | Freq: Two times a day (BID) | ORAL | Status: AC
  Administered 2024-09-23 – 2024-10-03 (×20): 2000 mg via ORAL
  Filled 2024-09-23 (×20): qty 4

## 2024-09-23 MED ORDER — PIPERACILLIN-TAZOBACTAM 3.375 G IVPB
3.3750 g | Freq: Three times a day (TID) | INTRAVENOUS | Status: DC
  Administered 2024-09-23 – 2024-09-25 (×5): 3.375 g via INTRAVENOUS
  Filled 2024-09-23 (×5): qty 50

## 2024-09-23 MED ORDER — VITAMIN D 25 MCG (1000 UNIT) PO TABS: 1000.0000 [IU] | ORAL_TABLET | Freq: Every day | ORAL | Status: DC

## 2024-09-23 MED ORDER — ROSUVASTATIN CALCIUM 20 MG PO TABS
20.0000 mg | ORAL_TABLET | Freq: Every day | ORAL | Status: DC
  Administered 2024-09-24: 20 mg via ORAL
  Filled 2024-09-23: qty 1

## 2024-09-23 MED ORDER — HYDROMORPHONE HCL 1 MG/ML IJ SOLN: 0.5000 mg | INTRAMUSCULAR | Status: DC | PRN

## 2024-09-23 MED ORDER — CALCIUM CARBONATE 1250 (500 CA) MG PO TABS: 1.0000 | ORAL_TABLET | Freq: Every day | ORAL | Status: DC

## 2024-09-23 MED ORDER — DOCUSATE SODIUM 100 MG PO CAPS
100.0000 mg | ORAL_CAPSULE | Freq: Two times a day (BID) | ORAL | Status: DC
  Administered 2024-09-23 – 2024-09-29 (×7): 100 mg via ORAL
  Filled 2024-09-23 (×9): qty 1

## 2024-09-23 MED ORDER — LACOSAMIDE 200 MG PO TABS
200.0000 mg | ORAL_TABLET | Freq: Two times a day (BID) | ORAL | Status: AC
  Administered 2024-09-23 – 2024-10-03 (×21): 200 mg via ORAL
  Filled 2024-09-23: qty 4
  Filled 2024-09-23 (×2): qty 1
  Filled 2024-09-23 (×3): qty 4
  Filled 2024-09-23 (×4): qty 1
  Filled 2024-09-23: qty 4
  Filled 2024-09-23 (×3): qty 1
  Filled 2024-09-23 (×2): qty 4
  Filled 2024-09-23 (×3): qty 1
  Filled 2024-09-23 (×2): qty 4
  Filled 2024-09-23: qty 1

## 2024-09-23 MED ORDER — GABAPENTIN 300 MG PO CAPS
300.0000 mg | ORAL_CAPSULE | Freq: Three times a day (TID) | ORAL | Status: AC
  Administered 2024-09-23 – 2024-10-03 (×31): 300 mg via ORAL
  Filled 2024-09-23 (×31): qty 1

## 2024-09-23 MED ORDER — TRAZODONE HCL 50 MG PO TABS
25.0000 mg | ORAL_TABLET | Freq: Every evening | ORAL | Status: AC | PRN
  Administered 2024-09-23: 25 mg via ORAL
  Filled 2024-09-23: qty 1

## 2024-09-23 MED ORDER — CARVEDILOL 6.25 MG PO TABS
6.2500 mg | ORAL_TABLET | Freq: Two times a day (BID) | ORAL | Status: AC
  Administered 2024-09-23 – 2024-10-03 (×20): 6.25 mg via ORAL
  Filled 2024-09-23 (×6): qty 1
  Filled 2024-09-23: qty 2
  Filled 2024-09-23 (×13): qty 1

## 2024-09-23 MED ORDER — POLYETHYLENE GLYCOL 3350 17 G PO PACK: 17.0000 g | PACK | Freq: Every day | ORAL | Status: AC | PRN

## 2024-09-23 MED ORDER — ACETAMINOPHEN 325 MG PO TABS
650.0000 mg | ORAL_TABLET | Freq: Four times a day (QID) | ORAL | Status: DC | PRN
  Administered 2024-09-23: 650 mg via ORAL
  Filled 2024-09-23: qty 2

## 2024-09-23 MED ORDER — PHENOBARBITAL 32.4 MG PO TABS
64.8000 mg | ORAL_TABLET | Freq: Two times a day (BID) | ORAL | Status: AC
  Administered 2024-09-23 – 2024-10-03 (×21): 64.8 mg via ORAL
  Filled 2024-09-23 (×21): qty 2

## 2024-09-23 MED ORDER — AMLODIPINE BESYLATE 10 MG PO TABS
10.0000 mg | ORAL_TABLET | Freq: Every day | ORAL | Status: AC
  Administered 2024-09-24 – 2024-10-03 (×10): 10 mg via ORAL
  Filled 2024-09-23 (×10): qty 1

## 2024-09-23 MED ORDER — ENOXAPARIN SODIUM 40 MG/0.4ML IJ SOSY
40.0000 mg | PREFILLED_SYRINGE | INTRAMUSCULAR | Status: DC
  Administered 2024-09-23: 40 mg via SUBCUTANEOUS
  Filled 2024-09-23: qty 0.4

## 2024-09-23 MED ORDER — PIPERACILLIN-TAZOBACTAM 3.375 G IVPB 30 MIN
3.3750 g | Freq: Once | INTRAVENOUS | Status: AC
  Administered 2024-09-23: 3.375 g via INTRAVENOUS
  Filled 2024-09-23: qty 50

## 2024-09-23 NOTE — ED Triage Notes (Addendum)
 Pt PTAR from [facility] for malaise and low grade fever today. More lethargic. Staff reports right great toe infection. Taking oral abx. A&Ox4  142/70 97% ra Hr 83 Rr 18 97.5 temp

## 2024-09-23 NOTE — Progress Notes (Signed)
 Pharmacy Antibiotic Note  Seth [name] is a 69 y.o. male admitted on 09/23/2024 with worsening R toe gangrene and cellulitis. Hx of known dry gangrene with osteo, L critical limb ischemia with tissue loss requiring L TMA on 09/22/2020. Recently seen by outpatient podiatry on 09/17/24 with plans for amputation of the R hallux - given a course of levofloxacin  and augmentin . Pharmacy has been consulted for vancomycin  and zosyn  dosing. -afebrile, WBC WNL  Plan: Vancomycin  1500mg  IV x1, followed by 1000mg  IV q12h (eAUC 502.4, Scr 1.09) Zosyn  3.375g IV q8h Monitor renal function, clinical status  Follow up plans for source control      Temp (24hrs), Avg:98.7 F (37.1 C), Min:98.7 F (37.1 C), Max:98.7 F (37.1 C)  Recent Labs  Lab 09/23/24 1323 09/23/24 1324  WBC  --  9.1  CREATININE  --  1.09  LATICACIDVEN 1.1  --     Estimated Creatinine Clearance: 73.9 mL/min (by C-G formula based on SCr of 1.09 mg/dL).    Allergies[1]  Antimicrobials this admission: Vanc 1/27 >> Zosyn  1/27 >>  Dose adjustments this admission:   Microbiology results: 1/27 BCx:    Thank you for allowing pharmacy to be a part of this patients care.  Joyce Heitman 09/23/2024 3:04 PM     [1] No Known Allergies

## 2024-09-23 NOTE — ED Notes (Signed)
[facility] called 

## 2024-09-23 NOTE — ED Provider Notes (Signed)
 " [place] EMERGENCY DEPARTMENT AT [facility] Provider Note   CSN: [id number] Arrival date & time: 09/23/24  1212     Patient presents with: Toe Pain   Seth [name] is a 69 y.o. male who presents to the emergency department with a chief complaint of possible worsening right great toe infection.  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  Patient initially brought in from [facility] for malaise and low-grade fever today, with staff stating that the patient is more lethargic per EMS report and nursing report.  Patient states he has been compliant with his oral antibiotics however is unable to tell me what these antibiotics are.  Per chart review it appears patient is following with Dr. Christine with podiatry, last seen on 09/17/2024, with plan for amputation by Dr. Malvin due to gangrene.  It appears wound care instructions given and patient was continued on levofloxacin  as well as Augmentin .  Appears a referral to vascular surgery was placed on 09/18/2024, patient was seen next day on 09/19/2024 and recommended for a right lower extremity angiogram.  Patient reports that he is still smoking cigarettes, smoking 3 to 4 cigarettes/day, mostly after meals. 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    Toe Pain      Prior to Admission medications  Medication Sig Start Date End Date Taking? Authorizing Provider  aspirin  EC 81 MG tablet Take 1 tablet (81 mg total) by mouth daily. Swallow whole. 06/12/23  Yes Singh, Prashant K, MD  bacitracin (QC BACITRACIN) 500 UNIT/GM ointment Apply 1 Application topically See admin instructions. Apply to great right toe 2  times a day on Mon/Tues/Wed/Thurs/Fri 09/19/24 10/24/24 Yes [provider]  carvedilol  (COREG ) 6.25 MG tablet Take 6.25 mg by mouth in the morning and at bedtime. 09/19/23  Yes [provider]  cholecalciferol  (VITAMIN D3) 25 MCG (1000 UNIT) tablet Take 1,000 Units by mouth daily.   Yes [provider]  EPSOM SALT GRAN 1 application  See admin instructions. Soak the right foot in epsom salts for 15 minutes, rinse, dry, and apply a dressing containing bacitracin ointment 2 times a day   Yes [provider]  folic acid  (FOLVITE ) 1 MG tablet Take 1 mg by mouth in the morning.   Yes [provider]  gabapentin  (NEURONTIN ) 300 MG capsule Take 300 mg by mouth in the morning, at noon, and at bedtime.   Yes [provider]  Lacosamide  (VIMPAT ) 100 MG TABS Take 200 mg by mouth in the morning and at bedtime.   Yes [provider]  levETIRAcetam  (KEPPRA ) 1000 MG tablet Take 2,000 mg by mouth in the morning and at bedtime.   Yes [provider]  levofloxacin  (LEVAQUIN ) 750 MG tablet Take 750 mg by mouth at bedtime. 09/17/24 10/01/24 Yes [provider]  naltrexone (DEPADE) 50 MG tablet Take 50 mg by mouth daily.   Yes [provider]  NORVASC  10 MG tablet Take 10 mg by mouth daily.   Yes [provider]  omeprazole (PRILOSEC) 40 MG capsule Take 40 mg by mouth daily before breakfast. 10/09/22  Yes [provider]  Oyster Shell 500 MG TABS Take 500 mg by mouth daily with breakfast.   Yes [provider]  PHENobarbital  (LUMINAL) 64.8 MG tablet Take 1 tablet (64.8 mg total) by mouth 2 (two) times daily. 04/22/24 04/17/25 Yes Camara, Amadou, MD  polyethylene glycol (MIRALAX ) 17 g packet Take 17 g by mouth daily as needed for moderate constipation. Patient taking differently: Take 17 g by mouth daily as needed (for constipation). 08/23/24  Yes Tegeler, Lonni PARAS, MD  rosuvastatin  (CRESTOR ) 20 MG tablet Take  1 tablet (20 mg total) by mouth daily. 01/02/24  Yes Gabino Boga, MD  thiamine  (VITAMIN B1) 100 MG tablet Take 100 mg by mouth daily.   Yes [provider]  amLODipine  (NORVASC ) 10 MG tablet Take 1 tablet (10 mg total) by mouth daily. Patient not taking: Reported on 09/23/2024 06/12/23   Singh, Prashant K, MD  calcium  carbonate (OS-CAL - DOSED IN MG OF ELEMENTAL CALCIUM ) 1250 (500 Ca) MG tablet Take 1 tablet (1,250 mg total) by mouth daily with breakfast. Patient not taking: Reported on 09/23/2024 07/28/24   Regalado, Belkys A, MD  gabapentin  (NEURONTIN ) 300 MG capsule Take 1 capsule (300 mg total) by mouth 3 (three) times daily. Patient not taking: Reported on 09/23/2024 06/13/23 09/19/24  Camara, Amadou, MD  lacosamide  (VIMPAT ) 200 MG TABS tablet Take 1 tablet (200 mg total) by mouth 2 (two) times daily. Patient not taking: Reported on 09/23/2024 05/08/24 02/02/25  Camara, Amadou, MD  levETIRAcetam  (KEPPRA ) 1000 MG tablet Take 2 tablets (2,000 mg total) by mouth 2 (two) times daily. Patient not taking: Reported on 09/23/2024 06/13/23 09/19/24  Camara, Amadou, MD  levofloxacin  (LEVAQUIN ) 750 MG tablet Take 1 tablet (750 mg total) by mouth daily for 10 days. Patient not taking: Reported on 09/23/2024 09/17/24 09/27/24  Christine Rush, DPM  mupirocin  ointment (BACTROBAN ) 2 % Apply 1 Application topically 2 (two) times daily. Patient not taking: Reported on 09/23/2024 09/09/24   Christine Rush, DPM    Allergies: Patient has no known allergies.    Review of Systems  Skin:  Positive for wound (Gangrenous wound present to R great toe).    Updated Vital Signs BP 134/79 (BP Location: Left Arm)   Pulse 68   Temp 97.7 F (36.5 C)   Resp 18   SpO2 99%   Physical Exam Vitals and nursing note reviewed.  Constitutional:      General: He is awake. He is not in acute distress.    Appearance: Normal appearance. He is not ill-appearing, toxic-appearing or diaphoretic.  HENT:     Head: Normocephalic  and atraumatic.  Eyes:     General: No scleral icterus. Cardiovascular:     Rate and Rhythm: Normal rate and regular rhythm.  Pulmonary:     Effort: Pulmonary effort is normal. No respiratory distress.     Breath sounds: No wheezing, rhonchi or rales.  Musculoskeletal:     Right lower leg: No edema.     Left lower leg: No edema.     Comments: DP and PT pulse of RLE confirmed with doppler  Skin:    General: Skin is warm.     Capillary Refill: Capillary refill takes less than 2 seconds.     Comments: R great toe appears mummified and gangrenous, there is a wound present to web space in between R great toe and 2nd toe as well that is not dry and does appear open, small amount of purulent drainage present with no evidence of surrounding cellulitis   Other small non bleeding and non draining wounds present to dorsum of 2nd and 4th  toes as well  Neurological:     General: No focal deficit present.     Mental Status: He is alert and oriented to person, place, and time.  Psychiatric:        Mood and Affect: Mood normal.        Behavior: Behavior normal. Behavior is cooperative.     (all labs ordered are listed, but only abnormal results are displayed) Labs Reviewed  CBC WITH DIFFERENTIAL/PLATELET - Abnormal; Notable for the following components:      Result Value   Monocytes Absolute 1.1 (*)    All other components within normal limits  C-REACTIVE PROTEIN - Abnormal; Notable for the following components:   CRP 14.9 (*)    All other components within normal limits  CULTURE, BLOOD (ROUTINE X 2)  CULTURE, BLOOD (ROUTINE X 2)  COMPREHENSIVE METABOLIC PANEL WITH GFR  SEDIMENTATION RATE  BASIC METABOLIC PANEL WITH GFR  CBC  I-STAT CG4 LACTIC ACID, ED    EKG: None  Radiology: DG Foot Complete Right Result Date: 09/23/2024 Please see detailed radiograph report in office note.  DG Toe Great Right Result Date: 09/23/2024 CLINICAL DATA:  Infection.  Gangrene.  Concern for  osteomyelitis. EXAM: RIGHT GREAT TOE COMPARISON:  09/17/2024 FINDINGS: Subcutaneous emphysema seen within the soft tissues of the great toe. There are questionable erosions of the tuft of the distal phalanx of the great toe which is suspicious for osteomyelitis. Deformity of the tip of the distal phalanx of the second toe may be the result of prior infection or trauma. IMPRESSION: 1. Subtle erosions of the tuft of the distal phalanx of the great toe are suspicious for osteomyelitis. Further evaluation with contrast-enhanced MRI should be considered. 2. Soft tissue swelling and subcutaneous emphysema of the great toe consistent with cellulitis. Electronically Signed   By: Aliene Lloyd M.D.   On: 09/23/2024 14:51     Procedures   Medications Ordered in the ED  piperacillin -tazobactam (ZOSYN ) IVPB 3.375 g (has no administration in time range)  vancomycin  (VANCOCIN ) IVPB 1000 mg/200 mL premix (has no administration in time range)  carvedilol  (COREG ) tablet 6.25 mg (6.25 mg Oral Given 09/23/24 1742)  levETIRAcetam  (KEPPRA ) tablet 2,000 mg (has no administration in time range)  lacosamide  (VIMPAT ) tablet 200 mg (has no administration in time range)  rosuvastatin  (CRESTOR ) tablet 20 mg (has no administration in time range)  PHENobarbital  (LUMINAL) tablet 64.8 mg (has no administration in time range)  amLODipine  (NORVASC ) tablet 10 mg (has no administration in time range)  pantoprazole  (PROTONIX ) EC tablet 40 mg (has no administration in time range)  gabapentin  (NEURONTIN ) capsule 300 mg (300 mg Oral Given 09/23/24 1742)  enoxaparin  (LOVENOX ) injection 40 mg (has no administration in time range)  acetaminophen  (TYLENOL ) tablet 650 mg (has no administration in time range)    Or  acetaminophen  (TYLENOL ) suppository 650 mg (has no administration in time range)  traZODone  (DESYREL ) tablet 25 mg (has no administration in time range)  ondansetron  (ZOFRAN ) tablet 4 mg (has no administration in time range)     Or  ondansetron  (ZOFRAN ) injection 4 mg (has no administration in time range)  albuterol  (PROVENTIL ) (2.5 MG/3ML) 0.083% nebulizer solution 2.5 mg (has no administration in time range)  oxyCODONE  (Oxy IR/ROXICODONE ) immediate release tablet 5 mg (has no administration in time range)  HYDROmorphone  (DILAUDID ) injection 0.5-1 mg (has no administration in time range)  docusate sodium  (COLACE) capsule 100 mg (has no administration in time range)  polyethylene glycol (MIRALAX  / GLYCOLAX )  packet 17 g (has no administration in time range)  cholecalciferol  (VITAMIN D3) 25 MCG (1000 UNIT) tablet 1,000 Units (has no administration in time range)  vancomycin  (VANCOREADY) IVPB 1500 mg/300 mL (0 mg Intravenous Stopped 09/23/24 1810)  piperacillin -tazobactam (ZOSYN ) IVPB 3.375 g (0 g Intravenous Stopped 09/23/24 1559)    Clinical Course as of 09/23/24 2146  Tue Sep 23, 2024  1457 Spoke with Dr. Serene who recommends admission  [CH]  32 Spoke with Dr. Malvin who recommends admission as well to [facility], need angiogram completed first prior to amputation, will admit to medicine [CH]  1514 Spoke with Dr. Roxane who agrees with admission [CH]    Clinical Course User Index [CH] Alexio Sroka, [place] FALCON, PA-C                                 Medical Decision Making Amount and/or Complexity of Data Reviewed Labs: ordered. Radiology: ordered.  Risk Prescription drug management. Decision regarding hospitalization.   Patient presents to the ED for concern of R great toe infection, possible low grade fever, lethargy, this involves an extensive number of treatment options, and is a complaint that carries with it a high risk of complications and morbidity.  The differential diagnosis includes sepsis, worsening R great toe infection, worsening blood flow, cellulitis, etc.    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   Additional history obtained:  Reviewed previous history including multiple notes from podiatry as well as vascular surgery, patient has been followed by podiatry due to his right great toe infection and known osteomyelitis, ultimately recommended for amputation with Dr. Malvin, however patient has significant ischemia to his right lower extremity and blood flow is trying to be improved by vascular first, scheduled for an outpatient angiogram however this is not until October 01, 2024, patient is supposed to be taking Augmentin  and Levofloxacin  outpatient, reports he has been compliant   Lab Tests:  I Ordered, and personally interpreted labs.  The pertinent results include: CBC unremarkable, CMP unremarkable, CRP in process, blood cultures pending, lactic acid 1.1, ESR pending   Imaging Studies ordered:  I ordered imaging studies including x-ray of right great toe I independently visualized and interpreted imaging which showed findings suspicious for osteomyelitis, soft tissue swelling and subcutaneous emphysema of the right great toe consistent with cellulitis I agree with the radiologist interpretation   Medicines ordered and prescription drug management:  I ordered medication including vancomycin  and Zosyn  for ongoing dry gangrene versus cellulitis of the right great toe Reevaluation of the patient after these medicines showed that the patient stayed the same I have reviewed the patients home medicines and have made adjustments as needed   Test Considered:  none   Critical Interventions:  none   Consultations Obtained:  I requested consultation with the vascular surgery team as well as podiatry team,  and discussed lab and imaging findings as well as pertinent plan - they recommend: Admission to [facility] for ongoing diagnosis and treatment, move  up angiogram and improve blood flow hopefully for amputation   Problem List / ED Course:  69 year old male, vital signs stable, afebrile, nontachycardic, presents to the emergency department for chief complaint of low-grade fever today and lethargy, known right great toe infection on oral antibiotics at  facility Physical exam patient is well-appearing, gangrenous right toe present, there is also evidence of open wound in between right great toe and second toe of right foot, there is mild purulent drainage with no surrounding cellulitis, I did check DP as well as PT pulses with Doppler which were able to be found, compared to previous pictures in chart it does appear that gangrene has continued to worsen and there is mild skin breakdown around right great toe as well Will obtain basic infectious labs CBC unremarkable, no leukocytosis, CMP unremarkable, lactic acid not elevated, blood cultures in process, ESR and CRP pending Will obtain x-ray of right great toe as well however per podiatry note patient already has osteomyelitis Per chart review it appears that patient has been seen by podiatry and recommended for amputation however due to history of critical limb ischemia patient was referred to vascular surgery who recommended RLE angiogram to try and improve blood flow prior to surgery as patient has history of ABI on the right decreasing from 0.99-0.66 with a toe pressure of 0 Labs overall reassuring today with no evidence of sepsis, vital signs stable, patient remains afebrile however infection does appear to be worsening per media, given this and active drainage along with subjective fevers and lethargy at facility will seek vascular as well as podiatry input regarding admission, as there is a chance systemic infection could be developing Spoke with Dr. Serene who is on-call for vascular surgery, recommends admission and moving up angiogram to try and improve blood flow for amputation Spoke with Dr.  Malvin who is on-call for podiatry, agrees with admission and recommends broad-spectrum antibiotics vancomycin  and Zosyn  for worsening infection Spoke with Dr. Roxane with the hospitalist team who will admit patient to [facility] for further treatment Most likely diagnosis at this time is ongoing right great toe infection complicated by limb ischemia   Reevaluation:  After the interventions noted above, I reevaluated the patient and found that they have :stayed the same   Social Determinants of Health:  none   Dispostion:  After consideration of the diagnostic results and the patients response to treatment, I feel that the patient would benefit from admission to the hospital for ongoing diagnosis and treatment.       Final diagnoses:  Toe infection  Gangrene of toe of right foot (HCC)  Critical limb ischemia of right lower extremity [facility])    ED Discharge Orders     None          Janetta Terrall FALCON, [place] 09/23/24 2146  "
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
## 2024-09-24 ENCOUNTER — Encounter (HOSPITAL_COMMUNITY): Admission: EM | Payer: Self-pay | Source: Skilled Nursing Facility | Attending: Internal Medicine

## 2024-09-24 ENCOUNTER — Encounter (HOSPITAL_COMMUNITY): Payer: Self-pay | Admitting: Internal Medicine

## 2024-09-24 DIAGNOSIS — I96 Gangrene, not elsewhere classified: Secondary | ICD-10-CM

## 2024-09-24 LAB — BASIC METABOLIC PANEL WITH GFR
Anion gap: 13 (ref 5–15)
BUN: 15 mg/dL (ref 8–23)
CO2: 20 mmol/L — ABNORMAL LOW (ref 22–32)
Calcium: 9 mg/dL (ref 8.9–10.3)
Chloride: 101 mmol/L (ref 98–111)
Creatinine, Ser: 1.02 mg/dL (ref 0.61–1.24)
GFR, Estimated: >60 mL/min
Glucose, Bld: 87 mg/dL (ref 70–99)
Potassium: 4.2 mmol/L (ref 3.5–5.1)
Sodium: 134 mmol/L — ABNORMAL LOW (ref 135–145)

## 2024-09-24 LAB — CBC
HCT: 39.3 % (ref 39.0–52.0)
HCT: 41.7 % (ref 39.0–52.0)
Hemoglobin: 12.7 g/dL — ABNORMAL LOW (ref 13.0–17.0)
Hemoglobin: 13.4 g/dL (ref 13.0–17.0)
MCH: 28.2 pg (ref 26.0–34.0)
MCH: 28 pg (ref 26.0–34.0)
MCHC: 32.3 g/dL (ref 30.0–36.0)
MCHC: 32.1 g/dL (ref 30.0–36.0)
MCV: 87.1 fL (ref 80.0–100.0)
MCV: 87.2 fL (ref 80.0–100.0)
Platelets: 301 10*3/uL (ref 150–400)
Platelets: 378 10*3/uL (ref 150–400)
RBC: 4.51 MIL/uL (ref 4.22–5.81)
RBC: 4.78 MIL/uL (ref 4.22–5.81)
RDW: 14.4 % (ref 11.5–15.5)
RDW: 14.3 % (ref 11.5–15.5)
WBC: 8.1 10*3/uL (ref 4.0–10.5)
WBC: 8.7 10*3/uL (ref 4.0–10.5)
nRBC: 0 % (ref 0.0–0.2)
nRBC: 0 % (ref 0.0–0.2)

## 2024-09-24 LAB — CREATININE, SERUM
Creatinine, Ser: 0.95 mg/dL (ref 0.61–1.24)
GFR, Estimated: >60 mL/min

## 2024-09-24 MED ORDER — ACETAMINOPHEN 325 MG PO TABS
650.0000 mg | ORAL_TABLET | ORAL | Status: AC | PRN
  Administered 2024-10-02: 650 mg via ORAL
  Filled 2024-09-24: qty 2

## 2024-09-24 MED ORDER — LABETALOL HCL 5 MG/ML IV SOLN: 10.0000 mg | INTRAVENOUS | Status: AC | PRN

## 2024-09-24 MED ORDER — HEPARIN (PORCINE) IN NACL 1000-0.9 UT/500ML-% IV SOLN
INTRAVENOUS | Status: DC | PRN
  Administered 2024-09-24: 1500 mL

## 2024-09-24 MED ORDER — LABETALOL HCL 5 MG/ML IV SOLN
INTRAVENOUS | Status: DC | PRN
  Administered 2024-09-24: 10 mg via INTRAVENOUS

## 2024-09-24 MED ORDER — MIDAZOLAM HCL (PF) 2 MG/2ML IJ SOLN
INTRAMUSCULAR | Status: DC | PRN
  Administered 2024-09-24: 1 mg via INTRAVENOUS

## 2024-09-24 MED ORDER — FENTANYL CITRATE (PF) 100 MCG/2ML IJ SOLN
INTRAMUSCULAR | Status: AC
  Filled 2024-09-24: qty 2

## 2024-09-24 MED ORDER — SODIUM CHLORIDE 0.9% FLUSH: 3.0000 mL | INTRAVENOUS | Status: AC | PRN

## 2024-09-24 MED ORDER — HYDRALAZINE HCL 20 MG/ML IJ SOLN: 5.0000 mg | INTRAMUSCULAR | Status: AC | PRN

## 2024-09-24 MED ORDER — SODIUM CHLORIDE 0.9 % WEIGHT BASED INFUSION
1.0000 mL/kg/h | INTRAVENOUS | Status: AC
  Administered 2024-09-24: 1 mL/kg/h via INTRAVENOUS

## 2024-09-24 MED ORDER — LABETALOL HCL 5 MG/ML IV SOLN
INTRAVENOUS | Status: AC
  Filled 2024-09-24: qty 4

## 2024-09-24 MED ORDER — HEPARIN SODIUM (PORCINE) 5000 UNIT/ML IJ SOLN
5000.0000 [IU] | Freq: Three times a day (TID) | INTRAMUSCULAR | Status: DC
  Administered 2024-09-25: 5000 [IU] via SUBCUTANEOUS
  Filled 2024-09-24: qty 1

## 2024-09-24 MED ORDER — HEPARIN SODIUM (PORCINE) 1000 UNIT/ML IJ SOLN
INTRAMUSCULAR | Status: AC
  Filled 2024-09-24: qty 10

## 2024-09-24 MED ORDER — LIDOCAINE HCL (PF) 1 % IJ SOLN
INTRAMUSCULAR | Status: AC
  Filled 2024-09-24: qty 30

## 2024-09-24 MED ORDER — HEPARIN (PORCINE) IN NACL 2000-0.9 UNIT/L-% IV SOLN: INTRAVENOUS | Status: DC | PRN

## 2024-09-24 MED ORDER — CLOPIDOGREL BISULFATE 300 MG PO TABS
ORAL_TABLET | ORAL | Status: DC | PRN
  Administered 2024-09-24: 300 mg via ORAL

## 2024-09-24 MED ORDER — MIDAZOLAM HCL 2 MG/2ML IJ SOLN
INTRAMUSCULAR | Status: AC
  Filled 2024-09-24: qty 2

## 2024-09-24 MED ORDER — HEPARIN SODIUM (PORCINE) 1000 UNIT/ML IJ SOLN
INTRAMUSCULAR | Status: DC | PRN
  Administered 2024-09-24: 5000 [IU] via INTRAVENOUS
  Administered 2024-09-24: 7000 [IU] via INTRAVENOUS

## 2024-09-24 MED ORDER — CLOPIDOGREL BISULFATE 300 MG PO TABS
ORAL_TABLET | ORAL | Status: AC
  Filled 2024-09-24: qty 1

## 2024-09-24 MED ORDER — FENTANYL CITRATE (PF) 100 MCG/2ML IJ SOLN
INTRAMUSCULAR | Status: DC | PRN
  Administered 2024-09-24: 50 ug via INTRAVENOUS

## 2024-09-24 MED ORDER — SODIUM CHLORIDE 0.9% FLUSH
3.0000 mL | Freq: Two times a day (BID) | INTRAVENOUS | Status: AC
  Administered 2024-09-25 – 2024-10-03 (×17): 3 mL via INTRAVENOUS

## 2024-09-24 MED ORDER — SODIUM CHLORIDE 0.9 % IV SOLN: 250.0000 mL | INTRAVENOUS | Status: DC | PRN

## 2024-09-24 MED ORDER — LIDOCAINE HCL (PF) 1 % IJ SOLN
INTRAMUSCULAR | Status: DC | PRN
  Administered 2024-09-24: 5 mL

## 2024-09-24 MED ORDER — SODIUM CHLORIDE 0.9 % IV SOLN: INTRAVENOUS | Status: DC

## 2024-09-24 MED ORDER — CLOPIDOGREL BISULFATE 75 MG PO TABS
75.0000 mg | ORAL_TABLET | Freq: Every day | ORAL | Status: AC
  Administered 2024-09-25 – 2024-10-03 (×9): 75 mg via ORAL
  Filled 2024-09-24 (×9): qty 1

## 2024-09-24 NOTE — Hospital Course (Addendum)
 Seth [name] is a 69 y.o. 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  He has been followed as an outpatient by his podiatrist Dr. Malvin, as well as vascular surgery.  He recently developed a wound of the right great toe after he got a new pair of shoes, this has gradually developed into gangrene.   He has been followed closely by vascular surgery as an outpatient, they were planning outpatient right lower extremity angiogram with possible SFA/popliteal intervention in the next few weeks.  During his recent vascular surgery clinic visit, he did complain of some purulent drainage from between his 1st and 2nd toes on the right foot, he was noted at his facility today to have fever.  Although the patient denies any chills, fevers, nausea, vomiting, he does state that pain at the medial side of his right foot around the great toe has started to increase.  He presented to the emergency department from his facility, vascular surgery recommends admission to [facility] for urgent revascularization and intervention.     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  Patient has no frank clinical evidence of acute infection, though he does have a small amount of pus being expressed from the wound as well as reported fever previously.   - Will cover empirically, although no frank clinical evidence of acute infection, not septic. -Empiric IV Zosyn  and IV vancomycin ; transition antibiotics to Augmentin  and doxycycline  to complete 5 more days; culture noted to be growing few E. Faecalis (sens to amp) and staph aureus  - Underwent angiogram with vascular surgery on 1/28.  Angioplasty performed to SFA, posterior tibial, and popliteal artery; no stents -  Successfully underwent amputation of right great toe on 09/25/2024 with vascular surgery -Evaluated by PT/OT; recommended for home health.  No DME needs - WBAT RLE in postop shoe - Sutures to remain for 2 to 3 weeks.  Dressing: Dressing: leave Xeroform 4 x 4 Kerlix Ace wrap dressing leave intact until follow-up next week. Tentative for ~2/5 - regarding dispo: evaluated by Seth [name], unable to return due to recent amputation and wound care status as well as concern for fall risk; will have to now go to SNF first before returning to [facility] [place]. Continue in hospital until SNF found   Hypertension-continue Coreg , amlodipine    Seizure disorder-continue home Keppra , Vimpat , luminal, gabapentin    Hyperlipidemia-Crestor 

## 2024-09-24 NOTE — Progress Notes (Signed)
" ° °  Brief Progress Note   _____________________________________________________________________________________________________________  Patient Name: Seth [name] Patient DOB: 1955-12-03 Date: @TODAY @      Data: pt having surgery on amputation of great big toe on 09/25/2024    Action: Secure chat sent to Dr. Malvin for pre surgical orders and consent orders     Response: n/a  _____________________________________________________________________________________________________________  [facility] RN Expeditor Ronal DELENA Bald Please contact us  directly via secure chat (search for [facility]) or by calling us  at [phone] [facility]).  "

## 2024-09-24 NOTE — Care Management (Signed)
 SDOH resources added to AVS

## 2024-09-24 NOTE — Consult Note (Signed)
 "  PODIATRY CONSULTATION  NAME Seth [name] MRN [id number] DOB [birth year] DOA 09/23/2024   Reason for consult:  Chief Complaint  Patient presents with   Toe Pain    Attending/Consulting physician: CHARM Apo MD  History of present illness: Seth [name] is a 69 y.o. 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  He has been followed as an outpatient by his podiatrist Dr. Malvin, as well as vascular surgery.  He recently developed a wound of the right great toe after he got a new pair of shoes, this has gradually developed into gangrene.   Patient previously seen in the clinic by Dr. Gabriel.  He referred him to myself and after chart review I referred the patient to vascular last week.  They saw the patient the next day and determined that angiogram was warranted for revascularization purposes prior to any amputation that may be necessary.  Unfortunately the patient had worsening of his wound and he was brought to the ED.  Discussed with him need for amputation of the right great toe given the gangrenous changes with superimposed infection.  He is agreeable to proceed all his questions were answered he had no concerns.  He says he has been n.p.o. and is planning to have angiogram today.  Past Medical History:  Diagnosis Date   COPD (chronic obstructive pulmonary disease) (HCC)    Depression    Enlarged prostate    Gout    Hypertension    Metabolic acidosis 07/19/2020   Seizures (HCC)    Stroke (HCC)        Latest Ref Rng & Units 09/24/2024    3:01 AM 09/23/2024    1:24 PM 07/27/2024    4:23 AM  CBC  WBC 4.0 - 10.5 K/uL 8.1  9.1  7.2   Hemoglobin 13.0 - 17.0 g/dL 87.2  86.2  86.7   Hematocrit 39.0 - 52.0 % 39.3  44.8  40.9   Platelets 150 - 400 K/uL 301  391  201        Latest Ref Rng & Units 09/24/2024     3:01 AM 09/23/2024    1:24 PM 07/27/2024    4:23 AM  BMP  Glucose 70 - 99 mg/dL 87  81  82   BUN 8 - 23 mg/dL 15  14  15    Creatinine 0.61 - 1.24 mg/dL 8.97  8.90  8.89   Sodium 135 - 145 mmol/L 134  138  137   Potassium 3.5 - 5.1 mmol/L 4.2  4.6  4.4   Chloride 98 - 111 mmol/L 101  102  102   CO2 22 - 32 mmol/L 20  25  23    Calcium  8.9 - 10.3 mg/dL 9.0  9.9  8.8       Physical Exam: Lower Extremity Exam  Nonpalpable DP PT pulses on right foot  Gangrenous changes to the right hallux extending to the proximal phalanx base area with malodor present appears to be some maceration of the tissues  Sensation intact though diminished to light touch  Edema of the forefoot       ASSESSMENT/PLAN OF CARE 69 y.o. male with PMHx significant for  hypertension, seizures, traumatic SDH, COPD  with right great toe gangrene changing from dry to wet with concern for superimposed infection worsening.  XR right great toe: 1. Subtle erosions  of the tuft of the distal phalanx of the great toe are suspicious for osteomyelitis. Further evaluation with contrast-enhanced MRI should be considered. 2. Soft tissue swelling and subcutaneous emphysema of the great toe consistent with cellulitis.  - N.p.o. past midnight tonight for OR tomorrow for right great toe amputation.  Patient aware of this wishes to proceed.  Consent ordered -Appreciate vascular surgery plan for right lower extremity angiogram for revascularization today. - Continue IV abx broad spectrum pending further culture data - Anticoagulation: Okay to continue per vascular recommendations - Wound care: Betadine  gauze dressing preop - WB status: Will be weightbearing as tolerated in postop shoe following surgery - Will continue to follow   Thank you for the consult.  Please contact me directly with any questions or concerns.           Seth [name], DPM [facility]    [address] [place], [place] [address]                Office [phone]  Fax [phone]     "

## 2024-09-24 NOTE — Progress Notes (Signed)
 " Progress Note    Seth [name]   FMW:[id number]  DOB: 1955/11/25  DOA: 09/23/2024     1 PCP: [name], NP  Initial CC: Right foot infection  Hospital Course: Seth [name] is a 69 y.o. 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  He has been followed as an outpatient by his podiatrist Dr. Malvin, as well as vascular surgery.  He recently developed a wound of the right great toe after he got a new pair of shoes, this has gradually developed into gangrene.   He has been followed closely by vascular surgery as an outpatient, they were planning outpatient right lower extremity angiogram with possible SFA/popliteal intervention in the next few weeks.  During his recent vascular surgery clinic visit, he did complain of some purulent drainage from between his 1st and 2nd toes on the right foot, he was noted at his facility today to have fever.  Although the patient denies any chills, fevers, nausea, vomiting, he does state that pain at the medial side of his right foot around the great toe has started to increase.  He presented to the emergency department from his facility, vascular surgery recommends admission to [facility] for urgent revascularization and intervention.     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  Patient has no frank clinical evidence of acute infection, though he does have a small amount of pus being expressed from the wound as well as reported fever previously.   - Will cover empirically, although no frank clinical evidence of acute infection, not septic. -Empiric IV Zosyn  and IV vancomycin  - Patient undergoing angiogram on 1/28 then surgery with podiatry on 1/29   Hypertension-continue Coreg , amlodipine     Seizure disorder-continue home Keppra , Vimpat , luminal, gabapentin    Hyperlipidemia-Crestor     Interval History:  Sitting up on edge of bed comfortable this morning.  Pain in right foot as anticipated.  Understands plan for angiogram and eventual surgery.   Antimicrobials: Zosyn  09/23/2024 >> current Vancomycin  09/23/2024 >> current  Consultants:  Podiatry fast Other surgery  Procedures:    DVT prophylaxis:  enoxaparin  (LOVENOX ) injection 40 mg Start: 09/23/24 2200   Code Status:   Code Status: Full Code  Barriers to discharge: None Therapy evaluation: PT Orders:   PT Follow up Rec:   Disposition Plan: Pending PT eval postop Status is: Inpatient  Mobility Assessment (Last 72 Hours)     Mobility Assessment     Row Name 09/24/24 1010 09/23/24 2200         Does the patient have exclusion criteria? No- Perform mobility assessment No- Perform mobility assessment      What is the highest level of mobility based on the mobility assessment? Level 4 (Ambulates with assistance) - Balance while stepping forward/back - Complete Level 4 (Ambulates with assistance) - Balance while stepping forward/back - Complete         Diet: Diet Orders (From admission, onward)     Start     Ordered   09/25/24 0001  Diet NPO time specified  Diet effective midnight        09/24/24 1338   09/24/24 0001  Diet NPO time specified  Diet effective midnight        09/23/24 1546  Objective: Blood pressure 131/78, pulse 62, temperature 98 F (36.7 C), temperature source Oral, resp. rate 18, height 6' 3 (1.905 m), weight 79.3 kg, SpO2 98%.  Examination:  Physical Exam Constitutional:      General: He is not in acute distress.    Appearance: Normal appearance.  HENT:     Head: Normocephalic and atraumatic.     Mouth/Throat:     Mouth: Mucous membranes are moist.  Eyes:     Extraocular Movements: Extraocular movements intact.  Cardiovascular:     Rate and Rhythm: Normal  rate and regular rhythm.  Pulmonary:     Effort: Pulmonary effort is normal. No respiratory distress.     Breath sounds: Normal breath sounds. No wheezing.  Abdominal:     General: Bowel sounds are normal. There is no distension.     Palpations: Abdomen is soft.     Tenderness: There is no abdominal tenderness.  Musculoskeletal:     Cervical back: Normal range of motion and neck supple.     Comments: See pictures from admission under media for right foot  Skin:    General: Skin is warm and dry.  Neurological:     General: No focal deficit present.     Mental Status: He is alert.  Psychiatric:        Mood and Affect: Mood normal.        Behavior: Behavior normal.      Data Reviewed: Results for orders placed or performed during the hospital encounter of 09/23/24 (from the past 24 hours)  Blood culture (routine x 2)     Status: None (Preliminary result)   Collection Time: 09/23/24 10:57 PM   Specimen: BLOOD  Result Value Ref Range   Specimen Description BLOOD SITE NOT SPECIFIED    Special Requests      BOTTLES DRAWN AEROBIC AND ANAEROBIC Blood Culture results may not be optimal due to an inadequate volume of blood received in culture bottles   Culture      NO GROWTH < 12 HOURS Performed at [facility], [address], [place], [place] [address]    Report Status PENDING   Basic metabolic panel     Status: Abnormal   Collection Time: 09/24/24  3:01 AM  Result Value Ref Range   Sodium 134 (L) 135 - 145 mmol/L   Potassium 4.2 3.5 - 5.1 mmol/L   Chloride 101 98 - 111 mmol/L   CO2 20 (L) 22 - 32 mmol/L   Glucose, Bld 87 70 - 99 mg/dL   BUN 15 8 - 23 mg/dL   Creatinine, Ser 8.97 0.61 - 1.24 mg/dL   Calcium  9.0 8.9 - 10.3 mg/dL   GFR, Estimated >39 >39 mL/min   Anion gap 13 5 - 15  CBC     Status: Abnormal   Collection Time: 09/24/24  3:01 AM  Result Value Ref Range   WBC 8.1 4.0 - 10.5 K/uL   RBC 4.51 4.22 - 5.81 MIL/uL   Hemoglobin 12.7 (L) 13.0 - 17.0 g/dL   HCT  60.6 60.9 - 47.9 %   MCV 87.1 80.0 - 100.0 fL   MCH 28.2 26.0 - 34.0 pg   MCHC 32.3 30.0 - 36.0 g/dL   RDW 85.5 88.4 - 84.4 %   Platelets 301 150 - 400 K/uL   nRBC 0.0 0.0 - 0.2 %    I have reviewed pertinent nursing notes, vitals, labs, and images as necessary. I have ordered labwork to  follow up on as indicated.  I have reviewed the last notes from staff over past 24 hours. I have discussed patient's care plan and test results with nursing staff, CM/SW, and other staff as appropriate.  Old records reviewed in assessment of this patient  Time spent: Greater than 50% of the 55 minute visit was spent in counseling/coordination of care for the patient as laid out in the A&P.   LOS: 1 day   Alm Apo, MD [facility] 09/24/2024, 2:40 PM "

## 2024-09-24 NOTE — Op Note (Signed)
 "   Patient name: Seth [name] MRN: [id number] DOB: 1956-01-14 Sex: male  09/24/2024 Pre-operative Diagnosis: Right lower extremity critical ischemia with tissue loss of the toe Post-operative diagnosis:  Same Surgeon:  Fonda FORBES Rim, MD Procedure Performed: 1.  Ultrasound-guided micropuncture access of the left common femoral artery in retrograde fashion 2.  Aortogram 3.  Second-order cannulation, right lower extremity angiogram 4.  Selective angiography of the peroneal artery 5.  Selective angiography of the posterior tibial artery 6.  Drug-coated balloon angioplasty 5 x 40, 5 x 40 superficial femoral artery 7.  Drug-coated balloon angioplasty 4 x 60, 4 x 60 popliteal artery 8.  Balloon angioplasty posterior tibial artery 2.5 x 220 mm Device assisted closure-Pro-glide   Indications: Patient is a 69 year old male with history of PAD and multiple interventions in the left lower extremity.  He presents with wounds to the right lower extremity and nonpalpable pulse.  Recent ABI demonstrates severely depressed toe pressure.  After discussing risks and benefits of lower extremity angiogram in effort to define improve distal perfusion for wound healing, Huck elected to proceed.  Findings:   Sluggish flow throughout indicative of heart failure Bilateral renal arteries widely patent Infrarenal aorta widely patent.  No flow-limiting stenosis in the aortoiliac segments bilaterally.  Right sided common femoral artery widely patent.  The superficial femoral artery was patent with 2 areas of focal, flow-limiting stenosis greater than 70%. The popliteal artery was severely diseased with focal area of stenosis greater than 75% Tibioperoneal trunk was patent.  There was single-vessel posterior tibial artery outflow with severe stenosis at its ostia and multiple areas of greater than 90% stenosis in 1 area of occlusion at the mid posterior tibial artery.  Multiple collaterals in the calf indicative  of chronic PAD.  The peroneal artery reconstituted from collaterals.  Distally, this filled the dorsalis pedis.   Procedure:  The patient was identified in the holding area and taken to room 8.  The patient was then placed supine on the table and prepped and draped in the usual sterile fashion.  A time out was called.  Ultrasound was used to evaluate the left common femoral artery.  It was patent .  A digital ultrasound image was acquired.  A micropuncture needle was used to access the left common femoral artery under ultrasound guidance.  An 018 wire was advanced without resistance and a micropuncture sheath was placed.  The 018 wire was removed and a benson wire was placed.  The micropuncture sheath was exchanged for a 5 french sheath.  An omniflush catheter was advanced over the wire to the level of L-1.  An abdominal angiogram was obtained.  Next, using the omniflush catheter and a benson wire, the aortic bifurcation was crossed and the catheter was placed into theright external iliac artery and right runoff was obtained.    See results above.  I elected to intervene on the multiple lesions throughout the right lower extremity.  I was able to use a series of wires and catheters and across the lesions in the superficial femoral artery, popliteal artery, posterior tibial artery including the occlusion at the mid posterior tibial artery.  The wire was then laid in the foot.  Next, I used a 2-1/2 mm balloon and angioplasty of the entirety of the posterior tibial artery this was followed by drug-coated balloon angioplasty of the superficial femoral artery in 2 locations, which demonstrated resolution of flow-limiting stenosis with residual stenosis less than 20%.  I then moved  to the popliteal artery.  I elected to undersized as the popliteal artery was measuring roughly 4.2 mm and I used a 4 mm balloon to prevent dissection as the artery was very diseased.  The balloon inflated nicely with follow-up angiography  demonstrating an excellent result.  There was 1 area with residual stenosis, however this was measured to 20%.  I then evaluated the posterior tibial artery again.  There was significant stenosis at the ostia as well as multiple lesions throughout the posterior tibial artery I used a 2.5 x 220 mm balloon and balloon the entirety of the artery with follow-up angiography demonstrating excellent result.  Resolution of flow-limiting stenosis.  Left-sided access closed using a ProGlide device.  Patient with inline flow via the posterior tibial artery to the foot. Patient has been maximally revascularized     Fonda FORBES Rim MD [facility] Office: [phone]    "

## 2024-09-24 NOTE — Anesthesia Preprocedure Evaluation (Signed)
 "                                  Anesthesia Evaluation  Patient identified by MRN, date of birth, ID band Patient awake    Reviewed: Allergy & Precautions, NPO status , Patient's Chart, lab work & pertinent test results  History of Anesthesia Complications Negative for: history of anesthetic complications  Airway Mallampati: III  TM Distance: >3 FB Neck ROM: Full   Comment: Previous grade I view with Glidescope 4, easy mask Dental  (+) Edentulous Upper, Edentulous Lower   Pulmonary neg shortness of breath, neg sleep apnea, COPD, neg recent URI, Current Smoker and Patient abstained from smoking.   Pulmonary exam normal breath sounds clear to auscultation       Cardiovascular hypertension (amlodipine , carvedilol ), Pt. on medications (-) angina + Peripheral Vascular Disease  (-) Past MI, (-) Cardiac Stents and (-) CABG (-) dysrhythmias  Rhythm:Regular Rate:Normal  HLD, pseudoaneurysm of carotid artery  TTE 03/14/2021: IMPRESSIONS    1. Left ventricular ejection fraction, by estimation, is 70 to 75%. The  left ventricle has hyperdynamic function.   2. Right ventricular systolic function is normal. The right ventricular  size is normal. There is normal pulmonary artery systolic pressure. The  estimated right ventricular systolic pressure is 19.8 mmHg.   3. The mitral valve is grossly normal. No evidence of mitral valve  regurgitation.   4. The aortic valve is grossly normal. Aortic valve regurgitation is not  visualized. No aortic stenosis is present. Aortic valve mean gradient  measures 8.0 mmHg. Aortic valve Vmax measures 1.94 m/s.     Neuro/Psych Seizures - (on Keppra ; last seizure ~3 months ago),  PSYCHIATRIC DISORDERS  Depression    CVA (right-sided weakness), Residual Symptoms    GI/Hepatic ,GERD  Medicated,,(+)     substance abuse  alcohol use  Endo/Other  negative endocrine ROS    Renal/GU negative Renal ROS   Enlarged prostate     Musculoskeletal Gangrene right great toe   Abdominal   Peds  Hematology negative hematology ROS (+) Lab Results      Component                Value               Date                      WBC                      9.7                 09/25/2024                HGB                      14.2                09/25/2024                HCT                      43.4                09/25/2024                MCV  87.0                09/25/2024                PLT                      400                 09/25/2024              Anesthesia Other Findings   Reproductive/Obstetrics                              Anesthesia Physical Anesthesia Plan  ASA: 4  Anesthesia Plan: MAC   Post-op Pain Management: Tylenol  PO (pre-op)*   Induction: Intravenous  PONV Risk Score and Plan: 0 and Ondansetron , Propofol  infusion, TIVA, Midazolam  and Treatment may vary due to age or medical condition  Airway Management Planned: Natural Airway and Simple Face Mask  Additional Equipment:   Intra-op Plan:   Post-operative Plan:   Informed Consent: I have reviewed the patients History and Physical, chart, labs and discussed the procedure including the risks, benefits and alternatives for the proposed anesthesia with the patient or authorized representative who has indicated his/her understanding and acceptance.     Dental advisory given  Plan Discussed with: CRNA and Anesthesiologist  Anesthesia Plan Comments: (Discussed with patient risks of MAC including, but not limited to, minor pain or discomfort, hearing people in the room, and possible need for backup general anesthesia. Risks for general anesthesia also discussed including, but not limited to, sore throat, hoarse voice, chipped/damaged teeth, injury to vocal cords, nausea and vomiting, allergic reactions, lung infection, heart attack, stroke, and death. All questions answered. )          Anesthesia Quick Evaluation  "

## 2024-09-24 NOTE — Progress Notes (Signed)
 Patient received to 4E02 from PACU, AO x4. CHG completed, connected to tele and CCMD notified. Patient in bed rest till 20:00. Bilateral DP and PT pulses has doppler signal. Plan of care continues.

## 2024-09-24 NOTE — Consult Note (Signed)
 "                                   [facility] [place]  Patient name: Seth [name] MRN: [id number] DOB: 13-Feb-1956 Sex: male   REQUESTING PROVIDER:    ER   REASON FOR CONSULT:    Right toe wound  HISTORY OF PRESENT ILLNESS:   Seth [name] is a 69 y.o. male, who presented to the ER with worsening right toe pain.  He was seen in the office several days ago and scheduled for angiography.  He has a history of left leg critical limb ischemia that ultimately ended up with a TMA.  He has had angioplasty and atherectomy on the left by both Dr. Silver and Dr. Sheree in 2022 and 2023.  Patient suffers from COPD secondary to smoking.  He has a history of stroke.  He takes a statin for hypercholesterolemia.  He is medically managed for hypertension.  PAST MEDICAL HISTORY    Past Medical History:  Diagnosis Date   COPD (chronic obstructive pulmonary disease) (HCC)    Depression    Enlarged prostate    Gout    Hypertension    Metabolic acidosis 07/19/2020   Seizures (HCC)    Stroke (HCC)      FAMILY HISTORY   Family History  Problem Relation Age of Onset   Hypertension Mother    Hypertension Father    Lung cancer Neg Hx    COPD Neg Hx     SOCIAL HISTORY:   Social History   Socioeconomic History   Marital status: Legally Separated    Spouse name: Not on file   Number of children: 3   Years of education: Not on file   Highest education level: 11th grade  Occupational History   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    Drug use: Not Currently    Types: Marijuana    Comment: in younger years   Sexual activity: Yes  Other Topics Concern   Not on file  Social History Narrative   Daughter lives with patient   Left Handed   Drinks caffeine occasionally   Social Drivers of Health   Tobacco Use: High Risk (09/24/2024)   Patient History    Smoking Tobacco Use: Every Day    Smokeless Tobacco Use: Never    Passive Exposure: Current  Financial Resource Strain: Not on file  Food Insecurity: No Food Insecurity (09/23/2024)   Epic    Worried About Programme Researcher, Broadcasting/film/video in the Last Year: Never true    Ran Out of Food in the Last Year: Never true  Transportation Needs: No Transportation Needs (09/23/2024)   Epic    Lack of Transportation (Medical): No    Lack of Transportation (Non-Medical): No  Physical Activity: Not on file  Stress: Not on file  Social Connections: Socially Isolated (09/23/2024)  Social Advertising Account Executive    Frequency of Communication with Friends and Family: More than three times a week    Frequency of Social Gatherings with Friends and Family: More than three times a week    Attends Religious Services: Patient declined    Database Administrator or Organizations: No    Attends Banker Meetings: Never    Marital Status: Never married  Intimate Partner Violence: Not At Risk (09/23/2024)   Epic    Fear of Current or Ex-Partner: No    Emotionally Abused: No    Physically Abused: No    Sexually Abused: No  Depression (PHQ2-9): Not on file  Alcohol Screen: Not on file  Housing: Low Risk (09/23/2024)   Epic    Unable to Pay for Housing in the Last Year: No    Number of Times Moved in the Last Year: 0    Homeless in the Last Year: No  Utilities: Not At Risk (09/23/2024)   Epic    Threatened with loss of utilities: No  Health Literacy: Not on file    ALLERGIES:    Allergies[1]  CURRENT MEDICATIONS:    Current Facility-Administered Medications  Medication  Dose Route Frequency Provider Last Rate Last Admin   acetaminophen  (TYLENOL ) tablet 650 mg  650 mg Oral Q6H PRN Zella, Mir M, MD   650 mg at 09/23/24 2223   Or   acetaminophen  (TYLENOL ) suppository 650 mg  650 mg Rectal Q6H PRN Zella, Mir M, MD       albuterol  (PROVENTIL ) (2.5 MG/3ML) 0.083% nebulizer solution 2.5 mg  2.5 mg Nebulization Q2H PRN Zella, Mir M, MD       amLODipine  (NORVASC ) tablet 10 mg  10 mg Oral Daily Zella, Mir M, MD       carvedilol  (COREG ) tablet 6.25 mg  6.25 mg Oral BID WC Zella, Mir M, MD   6.25 mg at 09/23/24 1742   cholecalciferol  (VITAMIN D3) 25 MCG (1000 UNIT) tablet 1,000 Units  1,000 Units Oral Daily Zella, Mir M, MD       docusate sodium  (COLACE) capsule 100 mg  100 mg Oral BID Zella, Mir M, MD   100 mg at 09/23/24 2225   enoxaparin  (LOVENOX ) injection 40 mg  40 mg Subcutaneous Q24H Zella, Mir M, MD   40 mg at 09/23/24 2225   gabapentin  (NEURONTIN ) capsule 300 mg  300 mg Oral TID Zella Katha HERO, MD   300 mg at 09/23/24 2225   HYDROmorphone  (DILAUDID ) injection 0.5-1 mg  0.5-1 mg Intravenous Q2H PRN Zella, Mir M, MD       lacosamide  (VIMPAT ) tablet 200 mg  200 mg Oral BID Zella, Mir M, MD   200 mg at 09/23/24 2223   levETIRAcetam  (KEPPRA ) tablet 2,000 mg  2,000 mg Oral BID Zella, Mir M, MD   2,000 mg at 09/23/24 2223   ondansetron  (ZOFRAN ) tablet 4 mg  4 mg Oral Q6H PRN Zella, Mir M, MD       Or   ondansetron  (ZOFRAN ) injection 4 mg  4 mg Intravenous Q6H PRN Zella, Mir M, MD       oxyCODONE  (Oxy IR/ROXICODONE ) immediate release tablet 5 mg  5 mg Oral Q4H PRN Zella, Mir M, MD       pantoprazole  (PROTONIX ) EC tablet 40 mg  40 mg Oral Daily Zella, Mir M, MD       PHENobarbital  (LUMINAL) tablet 64.8 mg  64.8 mg Oral BID Zella, Mir M,  MD   64.8 mg at 09/23/24 2223   piperacillin -tazobactam (ZOSYN ) IVPB 3.375 g  3.375 g Intravenous Q8H Utomwen, Adesuwa, RPH 12.5 mL/hr at 09/24/24 0630 3.375  g at 09/24/24 0630   polyethylene glycol (MIRALAX  / GLYCOLAX ) packet 17 g  17 g Oral Daily PRN Zella Katha HERO, MD       rosuvastatin  (CRESTOR ) tablet 20 mg  20 mg Oral Daily Zella, Mir M, MD       traZODone  (DESYREL ) tablet 25 mg  25 mg Oral QHS PRN Zella, Mir M, MD   25 mg at 09/23/24 2224   vancomycin  (VANCOCIN ) IVPB 1000 mg/200 mL premix  1,000 mg Intravenous Q12H Utomwen, Adesuwa, RPH 200 mL/hr at 09/24/24 0435 1,000 mg at 09/24/24 0435    REVIEW OF SYSTEMS:   [X]  denotes positive finding, [ ]  denotes negative finding Cardiac  Comments:  Chest pain or chest pressure:    Shortness of breath upon exertion:    Short of breath when lying flat:    Irregular heart rhythm:        Vascular    Pain in calf, thigh, or hip brought on by ambulation:    Pain in feet at night that wakes you up from your sleep:     Blood clot in your veins:    Leg swelling:         Pulmonary    Oxygen at home:    Productive cough:     Wheezing:         Neurologic    Sudden weakness in arms or legs:     Sudden numbness in arms or legs:     Sudden onset of difficulty speaking or slurred speech:    Temporary loss of vision in one eye:     Problems with dizziness:         Gastrointestinal    Blood in stool:      Vomited blood:         Genitourinary    Burning when urinating:     Blood in urine:        Psychiatric    Major depression:         Hematologic    Bleeding problems:    Problems with blood clotting too easily:        Skin    Rashes or ulcers:        Constitutional    Fever or chills:     PHYSICAL EXAM:   Vitals:   09/23/24 2020 09/23/24 2116 09/24/24 0457 09/24/24 0659  BP: (!) 155/84 134/79 101/69   Pulse: 74 68 66   Resp: 17 18 18    Temp:  97.7 F (36.5 C) (!) 97.4 F (36.3 C)   TempSrc:      SpO2: 97% 99% 99%   Height:    6' 3 (1.905 m)    GENERAL: The patient is a well-nourished male, in no acute distress. The vital signs are documented  above. CARDIAC: There is a regular rate and rhythm.  VASCULAR: Palpable femoral pulses PULMONARY: Nonlabored respirations ABDOMEN: Soft and non-tender with normal pitched bowel sounds.  MUSCULOSKELETAL: There are no major deformities or cyanosis. NEUROLOGIC: No focal weakness or paresthesias are detected. SKIN: There are no ulcers or rashes noted. PSYCHIATRIC: The patient has a normal affect.  STUDIES:   I have reviewed the following: ABI/TBIToday's ABIToday's TBIPrevious ABIPrevious TBI  +-------+-----------+-----------+------------+------------+  Right 0.66       0  0.99                      +-------+-----------+-----------+------------+------------+  Left  0.83       amp        0.61        amp           +-------+-----------+-----------+------------+------------+   ASSESSMENT and PLAN   Right toe ulcer: I discussed this is a limb threatening situation.  We discussed proceeding with angiography via left femoral approach and intervention on the right leg as indicated.  Podiatry has been consulted for management of the toe.  He will be n.p.o. after midnight and plans for angiography today.   Malvina Serene CLORE, MD, FACS [facility] [phone] Pager [phone]     [1] No Known Allergies  "

## 2024-09-24 NOTE — Discharge Instructions (Signed)

## 2024-09-25 ENCOUNTER — Encounter (HOSPITAL_COMMUNITY): Payer: Self-pay | Admitting: Vascular Surgery

## 2024-09-25 ENCOUNTER — Inpatient Hospital Stay (HOSPITAL_COMMUNITY)

## 2024-09-25 ENCOUNTER — Encounter (HOSPITAL_COMMUNITY): Admission: EM | Payer: Self-pay | Source: Skilled Nursing Facility | Attending: Internal Medicine

## 2024-09-25 ENCOUNTER — Inpatient Hospital Stay (HOSPITAL_COMMUNITY): Admitting: Anesthesiology

## 2024-09-25 ENCOUNTER — Encounter (HOSPITAL_COMMUNITY): Admitting: Anesthesiology

## 2024-09-25 DIAGNOSIS — F17210 Nicotine dependence, cigarettes, uncomplicated: Secondary | ICD-10-CM | POA: Diagnosis not present

## 2024-09-25 DIAGNOSIS — I10 Essential (primary) hypertension: Secondary | ICD-10-CM

## 2024-09-25 DIAGNOSIS — I96 Gangrene, not elsewhere classified: Secondary | ICD-10-CM

## 2024-09-25 DIAGNOSIS — J449 Chronic obstructive pulmonary disease, unspecified: Secondary | ICD-10-CM | POA: Diagnosis not present

## 2024-09-25 LAB — LIPID PANEL
Cholesterol: 134 mg/dL (ref 0–200)
HDL: 60 mg/dL
LDL Cholesterol: 63 mg/dL (ref 0–99)
Total CHOL/HDL Ratio: 2.3 ratio
Triglycerides: 60 mg/dL
VLDL: 12 mg/dL (ref 0–40)

## 2024-09-25 LAB — CBC WITH DIFFERENTIAL/PLATELET
Abs Immature Granulocytes: 0.06 10*3/uL (ref 0.00–0.07)
Basophils Absolute: 0 10*3/uL (ref 0.0–0.1)
Basophils Relative: 0 %
Eosinophils Absolute: 0.2 10*3/uL (ref 0.0–0.5)
Eosinophils Relative: 2 %
HCT: 43.4 % (ref 39.0–52.0)
Hemoglobin: 14.2 g/dL (ref 13.0–17.0)
Immature Granulocytes: 1 %
Lymphocytes Relative: 14 %
Lymphs Abs: 1.4 10*3/uL (ref 0.7–4.0)
MCH: 28.5 pg (ref 26.0–34.0)
MCHC: 32.7 g/dL (ref 30.0–36.0)
MCV: 87 fL (ref 80.0–100.0)
Monocytes Absolute: 1 10*3/uL (ref 0.1–1.0)
Monocytes Relative: 11 %
Neutro Abs: 7 10*3/uL (ref 1.7–7.7)
Neutrophils Relative %: 72 %
Platelets: 400 10*3/uL (ref 150–400)
RBC: 4.99 MIL/uL (ref 4.22–5.81)
RDW: 14.4 % (ref 11.5–15.5)
WBC: 9.7 10*3/uL (ref 4.0–10.5)
nRBC: 0 % (ref 0.0–0.2)

## 2024-09-25 LAB — BASIC METABOLIC PANEL WITH GFR
Anion gap: 15 (ref 5–15)
BUN: 11 mg/dL (ref 8–23)
CO2: 22 mmol/L (ref 22–32)
Calcium: 9.7 mg/dL (ref 8.9–10.3)
Chloride: 101 mmol/L (ref 98–111)
Creatinine, Ser: 0.91 mg/dL (ref 0.61–1.24)
GFR, Estimated: >60 mL/min
Glucose, Bld: 75 mg/dL (ref 70–99)
Potassium: 5 mmol/L (ref 3.5–5.1)
Sodium: 137 mmol/L (ref 135–145)

## 2024-09-25 LAB — MAGNESIUM: Magnesium: 2.4 mg/dL (ref 1.7–2.4)

## 2024-09-25 MED ORDER — CHLORHEXIDINE GLUCONATE 0.12 % MT SOLN
OROMUCOSAL | Status: AC
  Administered 2024-09-25: 15 mL via OROMUCOSAL
  Filled 2024-09-25: qty 15

## 2024-09-25 MED ORDER — OXYCODONE HCL 5 MG/5ML PO SOLN: 5.0000 mg | Freq: Once | ORAL | Status: DC | PRN

## 2024-09-25 MED ORDER — PROPOFOL 1000 MG/100ML IV EMUL
INTRAVENOUS | Status: AC
  Filled 2024-09-25: qty 100

## 2024-09-25 MED ORDER — ROSUVASTATIN CALCIUM 20 MG PO TABS
40.0000 mg | ORAL_TABLET | Freq: Every day | ORAL | Status: AC
  Administered 2024-09-25 – 2024-10-03 (×9): 40 mg via ORAL
  Filled 2024-09-25 (×9): qty 2

## 2024-09-25 MED ORDER — AMISULPRIDE (ANTIEMETIC) 5 MG/2ML IV SOLN: 10.0000 mg | Freq: Once | INTRAVENOUS | Status: DC | PRN

## 2024-09-25 MED ORDER — PROPOFOL 10 MG/ML IV BOLUS
INTRAVENOUS | Status: DC | PRN
  Administered 2024-09-25 (×2): 20 mg via INTRAVENOUS
  Administered 2024-09-25: 30 mg via INTRAVENOUS
  Administered 2024-09-25: 20 mg via INTRAVENOUS

## 2024-09-25 MED ORDER — 0.9 % SODIUM CHLORIDE (POUR BTL) OPTIME
TOPICAL | Status: DC | PRN
  Administered 2024-09-25: 1000 mL

## 2024-09-25 MED ORDER — DEXAMETHASONE SOD PHOSPHATE PF 10 MG/ML IJ SOLN
INTRAMUSCULAR | Status: AC
  Filled 2024-09-25: qty 1

## 2024-09-25 MED ORDER — PIPERACILLIN-TAZOBACTAM 3.375 G IVPB
3.3750 g | Freq: Three times a day (TID) | INTRAVENOUS | Status: DC
  Administered 2024-09-25 – 2024-09-26 (×2): 3.375 g via INTRAVENOUS
  Filled 2024-09-25 (×2): qty 50

## 2024-09-25 MED ORDER — ACETAMINOPHEN 500 MG PO TABS: 1000.0000 mg | ORAL_TABLET | Freq: Once | ORAL | Status: AC

## 2024-09-25 MED ORDER — CHLORHEXIDINE GLUCONATE 0.12 % MT SOLN: 15.0000 mL | Freq: Once | OROMUCOSAL | Status: AC

## 2024-09-25 MED ORDER — PHENYLEPHRINE 80 MCG/ML (10ML) SYRINGE FOR IV PUSH (FOR BLOOD PRESSURE SUPPORT)
PREFILLED_SYRINGE | INTRAVENOUS | Status: AC
  Filled 2024-09-25: qty 10

## 2024-09-25 MED ORDER — FENTANYL CITRATE (PF) 100 MCG/2ML IJ SOLN
INTRAMUSCULAR | Status: AC
  Filled 2024-09-25: qty 2

## 2024-09-25 MED ORDER — SUCCINYLCHOLINE CHLORIDE 200 MG/10ML IV SOSY
PREFILLED_SYRINGE | INTRAVENOUS | Status: AC
  Filled 2024-09-25: qty 10

## 2024-09-25 MED ORDER — LACTATED RINGERS IV SOLN: INTRAVENOUS | Status: DC | PRN

## 2024-09-25 MED ORDER — LIDOCAINE 2% (20 MG/ML) 5 ML SYRINGE
INTRAMUSCULAR | Status: AC
  Filled 2024-09-25: qty 5

## 2024-09-25 MED ORDER — ACETAMINOPHEN 500 MG PO TABS
ORAL_TABLET | ORAL | Status: AC
  Administered 2024-09-25: 1000 mg via ORAL
  Filled 2024-09-25: qty 2

## 2024-09-25 MED ORDER — FENTANYL CITRATE (PF) 100 MCG/2ML IJ SOLN: 25.0000 ug | INTRAMUSCULAR | Status: DC | PRN

## 2024-09-25 MED ORDER — OXYCODONE HCL 5 MG PO TABS: 5.0000 mg | ORAL_TABLET | Freq: Once | ORAL | Status: DC | PRN

## 2024-09-25 MED ORDER — ONDANSETRON HCL 4 MG/2ML IJ SOLN
INTRAMUSCULAR | Status: AC
  Filled 2024-09-25: qty 4

## 2024-09-25 MED ORDER — ROCURONIUM BROMIDE 10 MG/ML (PF) SYRINGE
PREFILLED_SYRINGE | INTRAVENOUS | Status: AC
  Filled 2024-09-25: qty 10

## 2024-09-25 MED ORDER — HEPARIN SODIUM (PORCINE) 5000 UNIT/ML IJ SOLN
5000.0000 [IU] | Freq: Three times a day (TID) | INTRAMUSCULAR | Status: AC
  Administered 2024-09-25 – 2024-10-03 (×24): 5000 [IU] via SUBCUTANEOUS
  Filled 2024-09-25 (×24): qty 1

## 2024-09-25 MED ORDER — LACTATED RINGERS IV SOLN: INTRAVENOUS | Status: DC

## 2024-09-25 MED ORDER — LIDOCAINE HCL (PF) 1 % IJ SOLN
INTRAMUSCULAR | Status: AC
  Filled 2024-09-25: qty 30

## 2024-09-25 MED ORDER — ONDANSETRON HCL 4 MG/2ML IJ SOLN
INTRAMUSCULAR | Status: AC
  Filled 2024-09-25: qty 2

## 2024-09-25 MED ORDER — ONDANSETRON HCL 4 MG/2ML IJ SOLN
INTRAMUSCULAR | Status: DC | PRN
  Administered 2024-09-25: 4 mg via INTRAVENOUS

## 2024-09-25 MED ORDER — EPHEDRINE 5 MG/ML INJ
INTRAVENOUS | Status: AC
  Filled 2024-09-25: qty 5

## 2024-09-25 MED ORDER — BUPIVACAINE HCL (PF) 0.5 % IJ SOLN
INTRAMUSCULAR | Status: AC
  Filled 2024-09-25: qty 30

## 2024-09-25 MED ORDER — LIDOCAINE HCL (PF) 1 % IJ SOLN
INTRAMUSCULAR | Status: DC | PRN
  Administered 2024-09-25: 5 mL

## 2024-09-25 MED ORDER — BUPIVACAINE HCL (PF) 0.5 % IJ SOLN
INTRAMUSCULAR | Status: DC | PRN
  Administered 2024-09-25: 5 mL

## 2024-09-25 MED ORDER — ORAL CARE MOUTH RINSE: 15.0000 mL | Freq: Once | OROMUCOSAL | Status: AC

## 2024-09-25 NOTE — Anesthesia Postprocedure Evaluation (Signed)
"   Anesthesia Post Note  Patient: Seth [name]  Procedure(s) Performed: AMPUTATION RIGHT GREAT, TOE (Right: Toe)     Patient location during evaluation: PACU Anesthesia Type: MAC Level of consciousness: awake Pain management: pain level controlled Vital Signs Assessment: post-procedure vital signs reviewed and stable Respiratory status: spontaneous breathing, nonlabored ventilation and respiratory function stable Cardiovascular status: stable and blood pressure returned to baseline Postop Assessment: no apparent nausea or vomiting Anesthetic complications: no   No notable events documented.  Last Vitals:  Vitals:   09/25/24 1325 09/25/24 1333  BP: 125/84 132/78  Pulse: 66 63  Resp: 20 18  Temp: 37 C   SpO2: 99% 100%    Last Pain:  Vitals:   09/25/24 1325  TempSrc: Oral  PainSc: 0-No pain                 [name]      "

## 2024-09-25 NOTE — Progress Notes (Signed)
 PHARMACIST LIPID MONITORING   Seth [name] is a 69 y.o. male admitted on 09/23/2024 with PVD.  Pharmacy has been consulted to optimize lipid-lowering therapy with the indication of secondary prevention for clinical ASCVD.  Patient/Protocol Exclusion: No, patient meets the inclusion criteria for the lipid protocol.   Recent Labs:  Lipid Panel (last 6 months):   Lab Results  Component Value Date   CHOL 134 09/25/2024   TRIG 60 09/25/2024   HDL 60 09/25/2024   CHOLHDL 2.3 09/25/2024   VLDL 12 09/25/2024   LDLCALC 63 09/25/2024    Hepatic function panel (last 6 months):   Lab Results  Component Value Date   AST 19 09/23/2024   ALT 9 09/23/2024   ALKPHOS 117 09/23/2024   BILITOT 0.3 09/23/2024    SCr (since admission):   Serum creatinine: 0.91 mg/dL 98/70/73 9742 Estimated creatinine clearance: 75.8 mL/min  Current therapy and lipid therapy tolerance Prior to admission lipid-lowering therapy: Crestor  20mg /day Documented or reported allergies or intolerances to lipid-lowering therapies (if applicable): none LDL goal : < 55  Plan:    1.Statin intensity (high intensity recommended for all patients regardless of the LDL):  Increase crestor  to 40mg  po daily  2.Add ezetimibe (if any one of the following):   Not indicated at this time.  3.Refer ([id number]) to [facility] pharmacist clinic:   No  4.Follow-up provider:   PCP - [name], NP   5.Follow-up after discharge:  Changes in lipid therapy were made. Check a lipid panel in 8-12 weeks then annually.  Prentice Poisson, PharmD Clinical Pharmacist **Pharmacist phone directory can now be found on [email or web address] ([email or web address]).  Listed under [facility].

## 2024-09-25 NOTE — Op Note (Signed)
 Full Operative Report  Date of Operation: 12:15 PM, 09/25/2024   Patient: Seth [name] - 69 y.o. male  Surgeon: Malvin Marsa FALCON, DPM   Assistant: None  Diagnosis: Gangrene right great toe  Procedure:  1. Amputation right great toe at MPJ level    Anesthesia: Monitor Anesthesia Care  No responsible provider has been recorded for the case.  No anesthesia staff entered.   Estimated Blood Loss: Minimal   Hemostasis: 1) Anatomical dissection, mechanical compression, electrocautery 2) no tourniquet   Implants: * No implants in log *  Materials: prolene 3-0  Injectables: 1) Pre-operatively: 10 cc of 50:50 mixture 1%lidocaine  plain and 0.5% marcaine  plain 2) Post-operatively: None   Specimens: - Pathology: Right great toe - Microbiology: tissue culture right great toe   Antibiotics: IV antibiotics given per schedule on the floor  Drains: None  Complications: Patient tolerated the procedure well without complication.   Operative findings: As below in detailed report  Indications for Procedure: Seth [name] presents to [facility], [place] with a chief complaint of right great toe gangrene with underlying infeciton. The patient has failed conservative treatments of various modalities. At this time the patient has elected to proceed with surgical correction. All alternatives, risks, and complications of the procedures were thoroughly explained to the patient. Patient exhibits appropriate understanding of all discussion points and informed consent was signed and obtained in the chart with no guarantees to surgical outcome given or implied.  Description of Procedure: Patient was brought to the operating room. Patient remained on their hospital bed in the supine position. A surgical timeout was performed and all members of the operating room, the procedure, and the surgical site were identified. anesthesia occurred as per anesthesia record. Local anesthetic  as previously described was then injected about the operative field in a local infiltrative block.  The operative lower extremity as noted above was then prepped and draped in the usual sterile manner. The following procedure then began.  Attention was directed to the first digit on the RIGHT foot. A full-thickness incision encompassing the entire digit was made using a #15 blade. Dissection was carried down to bone. The toe was secured with a towel clamp, further dissected in its entirety, and disarticulated at the MPJ and passed to the back table as a gross specimen. This was then labled and sent to pathology. The bone was noted to be soft and eroded, and consistent with osteomyelitis. All remaining necrotic and devitalized soft tissue structures were visualized and dissected away using sharp and dull dissection. Care was taken to protect all neurovascular structures throughout the dissection. All bleeders were cauterized as necessary. A deep tissue culture was obtained at this time. The area was then flushed with copious amounts of sterile saline. Then using the suture materials previously described, the site was closed in anatomic layers and the skin was well approximated under minimal tension.   The surgical site was then dressed with xeroform 4x4 kerlix and ace wrap. The patient tolerated both the procedure and anesthesia well with vital signs stable throughout. The patient was transferred in good condition and all vital signs stable  from the OR to recovery under the discretion of anesthesia.  Condition: Vital signs stable, neurovascular status unchanged from preoperative   Surgical plan:  Expect clean margin, recommend 7 days PO abx from surgery doxycycline ok. Follow up 1 week with me, office to arrange. Leave surgical dressing intact until follow up.  The patient will be WBAT in  a Post op shoe to the operative limb until further instructed. The dressing is to remain clean, dry, and intact. Will  continue to follow unless noted elsewhere.   Marsa Honour, DPM [facility]

## 2024-09-25 NOTE — Anesthesia Procedure Notes (Signed)
 Procedure Name: MAC Date/Time: 09/25/2024 12:20 PM  Performed by: Boyce Shilling, CRNAPre-anesthesia Checklist: Patient identified, Emergency Drugs available, Suction available, Timeout performed and Patient being monitored Patient Re-evaluated:Patient Re-evaluated prior to induction Oxygen Delivery Method: Simple face mask Induction Type: IV induction Dental Injury: Teeth and Oropharynx as per pre-operative assessment

## 2024-09-25 NOTE — Progress Notes (Signed)
 History and Physical Interval Note:  09/25/2024 11:37 AM  Seth [name]  has presented today for surgery, with the diagnosis of gangrene right great toe.  The various methods of treatment have been discussed with the patient and family. After consideration of risks, benefits and other options for treatment, the patient has consented to   Procedures with comments: AMPUTATION, TOE (Right) - Right great toe amp as a surgical intervention.  The patient's history has been reviewed, patient examined, no change in status, stable for surgery.  I have reviewed the patient's chart and labs.  Questions were answered to the patient's satisfaction.     Seth [name]

## 2024-09-25 NOTE — Progress Notes (Signed)
" °  Daily Progress Note  S/p: Right lower extremity balloon angioplasty of the SFA, popliteal artery, with reconstitution of the posterior tibial artery  Subjective: States he feels good this morning.  States the right leg feels better.  Objective: Vitals:   09/24/24 2300 09/25/24 0348  BP: (!) 153/92 139/84  Pulse: 67 89  Resp: 19 20  Temp: 98 F (36.7 C) 98.1 F (36.7 C)  SpO2: 100%     Physical Examination No groin hematoma, improved multiphasic signal in the right posterior tibial artery  ASSESSMENT/PLAN:  Patient is a 69 year old male with known PAD admitted with critical limb ischemia with tissue loss.  He underwent right sided angiogram with intervention as outlined above.  Overall doing well.  He is maximally revascularized.  He is aware that with his comorbidities, wound healing a ray amputation will still be difficult.   Fonda FORBES Rim MD MS [facility] [phone] 09/25/2024  8:16 AM  "

## 2024-09-25 NOTE — Transfer of Care (Signed)
 Immediate Anesthesia Transfer of Care Note  Patient: Seth [name]  Procedure(s) Performed: AMPUTATION RIGHT GREAT, TOE (Right: Toe)  Patient Location: PACU  Anesthesia Type:MAC  Level of Consciousness: awake, alert , and oriented  Airway & Oxygen Therapy: Patient Spontanous Breathing  Post-op Assessment: Report given to RN and Post -op Vital signs reviewed and stable  Post vital signs: Reviewed and stable  Last Vitals:  Vitals Value Taken Time  BP 119/72 09/25/24 12:54  Temp 36.4 C 09/25/24 12:54  Pulse 61 09/25/24 12:56  Resp 17 09/25/24 12:56  SpO2 98 % 09/25/24 12:56  Vitals shown include unfiled device data.  Last Pain:  Vitals:   09/25/24 1133  TempSrc:   PainSc: 0-No pain      Patients Stated Pain Goal: 0 (09/24/24 2030)  Complications: No notable events documented.

## 2024-09-25 NOTE — Progress Notes (Signed)
 Orthopedic Tech Progress Note Patient Details:  Seth [name] 1956-02-21 [id number]  Ortho Devices Type of Ortho Device: Postop shoe/boot Ortho Device/Splint Location: delivered to 4E04 Ortho Device/Splint Interventions: Ordered      Tinnie Ronal Brasil 09/25/2024, 3:01 PM

## 2024-09-25 NOTE — Progress Notes (Signed)
 Patient top of dressing got saturated with blood,on assessment this RN noticed it is bleeding from the tip of 4th toe,rather than the amputated toe ,fresh cut on skin  noticed on the tip of that toe ,[place]  MD notified, advice to change the dressing Dressing changed, incision site negative for bleeding

## 2024-09-25 NOTE — Progress Notes (Signed)
 " Progress Note    Seth [name]   FMW:[id number]  DOB: 26-Sep-1955  DOA: 09/23/2024     2 PCP: [name], NP  Initial CC: Right foot infection  Hospital Course: Seth [name] is a 69 y.o. 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  He has been followed as an outpatient by his podiatrist Dr. Malvin, as well as vascular surgery.  He recently developed a wound of the right great toe after he got a new pair of shoes, this has gradually developed into gangrene.   He has been followed closely by vascular surgery as an outpatient, they were planning outpatient right lower extremity angiogram with possible SFA/popliteal intervention in the next few weeks.  During his recent vascular surgery clinic visit, he did complain of some purulent drainage from between his 1st and 2nd toes on the right foot, he was noted at his facility today to have fever.  Although the patient denies any chills, fevers, nausea, vomiting, he does state that pain at the medial side of his right foot around the great toe has started to increase.  He presented to the emergency department from his facility, vascular surgery recommends admission to [facility] for urgent revascularization and intervention.     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  Patient has no frank clinical evidence of acute infection, though he does have a small amount of pus being expressed from the wound as well as reported fever previously.   - Will cover empirically, although no frank clinical evidence of acute infection, not septic. -Empiric IV Zosyn  and IV vancomycin  - Underwent angiogram with vascular surgery on 1/28.  Angioplasty performed to SFA, posterior tibial, and popliteal  artery; no stents - Next plan is amputation with podiatry, follow-up surgery results   Hypertension-continue Coreg , amlodipine    Seizure disorder-continue home Keppra , Vimpat , luminal, gabapentin    Hyperlipidemia-Crestor     Interval History:  No events overnight.  Tolerated angiogram well yesterday. Slightly confused this morning, likely some delirium.  Otherwise doing okay.  Antimicrobials: Zosyn  09/23/2024 >> current Vancomycin  09/23/2024 >> current  Consultants:  Podiatry  Vascular surgery  Procedures:  09/24/2024: Angiogram with balloon angioplasty  DVT prophylaxis:  heparin  injection 5,000 Units Start: 09/24/24 2200   Code Status:   Code Status: Full Code  Barriers to discharge: None Therapy evaluation: PT Orders:   PT Follow up Rec:   Disposition Plan: Pending PT eval postop Status is: Inpatient  Mobility Assessment (Last 72 Hours)     Mobility Assessment     Row Name 09/25/24 0720 09/24/24 2022 09/24/24 1822 09/24/24 1010 09/23/24 2200   Does the patient have exclusion criteria? No- Perform mobility assessment No- Perform mobility assessment No- Perform mobility assessment No- Perform mobility assessment No- Perform mobility assessment   What is the highest level of mobility based on the mobility assessment? Level 4 (Ambulates with assistance) - Balance while stepping forward/back - Complete Level 4 (Ambulates with assistance) - Balance while stepping forward/back - Complete Level 4 (Ambulates with assistance) - Balance while stepping forward/back - Complete Level 4 (Ambulates with assistance) - Balance while stepping forward/back - Complete Level 4 (Ambulates with assistance) - Balance while stepping forward/back - Complete      Diet: Diet Orders (From admission, onward)  Start     Ordered   09/25/24 0233  Diet NPO time specified  Diet effective now        09/25/24 0232            Objective: Blood pressure (!) 143/84, pulse 70, temperature 98 F  (36.7 C), temperature source Oral, resp. rate 20, height 6' 3 (1.905 m), weight 68 kg, SpO2 98%.  Examination:  Physical Exam Constitutional:      General: He is not in acute distress.    Appearance: Normal appearance.  HENT:     Head: Normocephalic and atraumatic.     Mouth/Throat:     Mouth: Mucous membranes are moist.  Eyes:     Extraocular Movements: Extraocular movements intact.  Cardiovascular:     Rate and Rhythm: Normal rate and regular rhythm.  Pulmonary:     Effort: Pulmonary effort is normal. No respiratory distress.     Breath sounds: Normal breath sounds. No wheezing.  Abdominal:     General: Bowel sounds are normal. There is no distension.     Palpations: Abdomen is soft.     Tenderness: There is no abdominal tenderness.  Musculoskeletal:     Cervical back: Normal range of motion and neck supple.     Comments: See pictures from admission under media for right foot  Skin:    General: Skin is warm and dry.  Neurological:     General: No focal deficit present.     Mental Status: He is alert.  Psychiatric:        Mood and Affect: Mood normal.        Behavior: Behavior normal.      Data Reviewed: Results for orders placed or performed during the hospital encounter of 09/23/24 (from the past 24 hours)  CBC     Status: None   Collection Time: 09/24/24  8:18 PM  Result Value Ref Range   WBC 8.7 4.0 - 10.5 K/uL   RBC 4.78 4.22 - 5.81 MIL/uL   Hemoglobin 13.4 13.0 - 17.0 g/dL   HCT 58.2 60.9 - 47.9 %   MCV 87.2 80.0 - 100.0 fL   MCH 28.0 26.0 - 34.0 pg   MCHC 32.1 30.0 - 36.0 g/dL   RDW 85.6 88.4 - 84.4 %   Platelets 378 150 - 400 K/uL   nRBC 0.0 0.0 - 0.2 %  Creatinine, serum     Status: None   Collection Time: 09/24/24  8:18 PM  Result Value Ref Range   Creatinine, Ser 0.95 0.61 - 1.24 mg/dL   GFR, Estimated >39 >39 mL/min  Basic metabolic panel with GFR     Status: None   Collection Time: 09/25/24  2:57 AM  Result Value Ref Range   Sodium 137 135 -  145 mmol/L   Potassium 5.0 3.5 - 5.1 mmol/L   Chloride 101 98 - 111 mmol/L   CO2 22 22 - 32 mmol/L   Glucose, Bld 75 70 - 99 mg/dL   BUN 11 8 - 23 mg/dL   Creatinine, Ser 9.08 0.61 - 1.24 mg/dL   Calcium  9.7 8.9 - 10.3 mg/dL   GFR, Estimated >39 >39 mL/min   Anion gap 15 5 - 15  CBC with Differential/Platelet     Status: None   Collection Time: 09/25/24  2:57 AM  Result Value Ref Range   WBC 9.7 4.0 - 10.5 K/uL   RBC 4.99 4.22 - 5.81 MIL/uL   Hemoglobin 14.2 13.0 - 17.0 g/dL  HCT 43.4 39.0 - 52.0 %   MCV 87.0 80.0 - 100.0 fL   MCH 28.5 26.0 - 34.0 pg   MCHC 32.7 30.0 - 36.0 g/dL   RDW 85.5 88.4 - 84.4 %   Platelets 400 150 - 400 K/uL   nRBC 0.0 0.0 - 0.2 %   Neutrophils Relative % 72 %   Neutro Abs 7.0 1.7 - 7.7 K/uL   Lymphocytes Relative 14 %   Lymphs Abs 1.4 0.7 - 4.0 K/uL   Monocytes Relative 11 %   Monocytes Absolute 1.0 0.1 - 1.0 K/uL   Eosinophils Relative 2 %   Eosinophils Absolute 0.2 0.0 - 0.5 K/uL   Basophils Relative 0 %   Basophils Absolute 0.0 0.0 - 0.1 K/uL   Immature Granulocytes 1 %   Abs Immature Granulocytes 0.06 0.00 - 0.07 K/uL  Magnesium      Status: None   Collection Time: 09/25/24  2:57 AM  Result Value Ref Range   Magnesium  2.4 1.7 - 2.4 mg/dL  Lipid panel     Status: None   Collection Time: 09/25/24  2:57 AM  Result Value Ref Range   Cholesterol 134 0 - 200 mg/dL   Triglycerides 60 <849 mg/dL   HDL 60 >59 mg/dL   Total CHOL/HDL Ratio 2.3 RATIO   VLDL 12 0 - 40 mg/dL   LDL Cholesterol 63 0 - 99 mg/dL    I have reviewed pertinent nursing notes, vitals, labs, and images as necessary. I have ordered labwork to follow up on as indicated.  I have reviewed the last notes from staff over past 24 hours. I have discussed patient's care plan and test results with nursing staff, CM/SW, and other staff as appropriate.  Old records reviewed in assessment of this patient  Time spent: Greater than 50% of the 55 minute visit was spent in  counseling/coordination of care for the patient as laid out in the A&P.   LOS: 2 days   Alm Apo, MD [facility] 09/25/2024, 12:11 PM "

## 2024-09-26 ENCOUNTER — Other Ambulatory Visit (HOSPITAL_COMMUNITY): Payer: Self-pay

## 2024-09-26 ENCOUNTER — Telehealth: Payer: Self-pay | Admitting: Podiatry

## 2024-09-26 ENCOUNTER — Encounter (HOSPITAL_COMMUNITY): Payer: Self-pay | Admitting: Podiatry

## 2024-09-26 DIAGNOSIS — I96 Gangrene, not elsewhere classified: Secondary | ICD-10-CM | POA: Diagnosis not present

## 2024-09-26 LAB — GLUCOSE, CAPILLARY: Glucose-Capillary: 83 mg/dL (ref 70–99)

## 2024-09-26 MED ORDER — OXYCODONE HCL 5 MG PO TABS
5.0000 mg | ORAL_TABLET | ORAL | 0 refills | Status: AC | PRN
  Filled 2024-09-26: qty 20, 4d supply, fill #0

## 2024-09-26 MED ORDER — DOXYCYCLINE HYCLATE 100 MG PO TABS
100.0000 mg | ORAL_TABLET | Freq: Two times a day (BID) | ORAL | Status: AC
  Administered 2024-09-26 – 2024-09-30 (×10): 100 mg via ORAL
  Filled 2024-09-26 (×10): qty 1

## 2024-09-26 MED ORDER — DOXYCYCLINE HYCLATE 100 MG PO TABS
100.0000 mg | ORAL_TABLET | Freq: Two times a day (BID) | ORAL | 0 refills | Status: AC
  Filled 2024-09-26: qty 10, 5d supply, fill #0

## 2024-09-26 MED ORDER — CLOPIDOGREL BISULFATE 75 MG PO TABS
75.0000 mg | ORAL_TABLET | Freq: Every day | ORAL | 2 refills | Status: AC
  Filled 2024-09-26: qty 90, 90d supply, fill #0

## 2024-09-26 MED ORDER — AMOXICILLIN-POT CLAVULANATE 875-125 MG PO TABS
1.0000 | ORAL_TABLET | Freq: Two times a day (BID) | ORAL | Status: AC
  Administered 2024-09-26 – 2024-09-30 (×10): 1 via ORAL
  Filled 2024-09-26 (×10): qty 1

## 2024-09-26 MED ORDER — AMOXICILLIN-POT CLAVULANATE 875-125 MG PO TABS
1.0000 | ORAL_TABLET | Freq: Two times a day (BID) | ORAL | 0 refills | Status: AC
  Filled 2024-09-26: qty 10, 5d supply, fill #0

## 2024-09-26 MED ORDER — OXYCODONE HCL 5 MG PO TABS: 5.0000 mg | ORAL_TABLET | ORAL | Status: AC | PRN

## 2024-09-26 NOTE — Telephone Encounter (Signed)
 Joen Bright a Case Manager from [facility] called in regards to patient's follow up visit stating that patient is currently still in the hospital, and they are waiting to see if he is going to be transferred to physical therapy facility. Her telephone number is [phone]. Will call back and follow up with patient next week.

## 2024-09-26 NOTE — Telephone Encounter (Signed)
 Left a vm for the patient. Takeem, to give us  a call back and set up a follow-up appt with Dr. Malvin

## 2024-09-26 NOTE — Plan of Care (Signed)
" °  Problem: Education: Goal: Knowledge of General Education information will improve Description: Including pain rating scale, medication(s)/side effects and non-pharmacologic comfort measures Outcome: Progressing   Problem: Health Behavior/Discharge Planning: Goal: Ability to manage health-related needs will improve Outcome: Progressing   Problem: Clinical Measurements: Goal: Ability to maintain clinical measurements within normal limits will improve Outcome: Progressing Goal: Will remain free from infection Outcome: Progressing Goal: Diagnostic test results will improve Outcome: Progressing Goal: Respiratory complications will improve Outcome: Progressing Goal: Cardiovascular complication will be avoided Outcome: Progressing   Problem: Activity: Goal: Risk for activity intolerance will decrease Outcome: Progressing   Problem: Nutrition: Goal: Adequate nutrition will be maintained Outcome: Progressing   Problem: Coping: Goal: Level of anxiety will decrease Outcome: Progressing   Problem: Elimination: Goal: Will not experience complications related to bowel motility Outcome: Progressing Goal: Will not experience complications related to urinary retention Outcome: Progressing   Problem: Pain Managment: Goal: General experience of comfort will improve and/or be controlled Outcome: Progressing   Problem: Safety: Goal: Ability to remain free from injury will improve Outcome: Progressing   Problem: Skin Integrity: Goal: Risk for impaired skin integrity will decrease Outcome: Progressing   Problem: Education: Goal: Understanding of CV disease, CV risk reduction, and recovery process will improve Outcome: Progressing Goal: Individualized Educational Video(s) Outcome: Progressing   Problem: Activity: Goal: Ability to return to baseline activity level will improve Outcome: Progressing   Problem: Cardiovascular: Goal: Ability to achieve and maintain adequate  cardiovascular perfusion will improve Outcome: Progressing Goal: Vascular access site(s) Level 0-1 will be maintained Outcome: Progressing   Problem: Health Behavior/Discharge Planning: Goal: Ability to safely manage health-related needs after discharge will improve Outcome: Progressing   Problem: Education: Goal: Knowledge of the prescribed therapeutic regimen will improve Outcome: Progressing   Problem: Bowel/Gastric: Goal: Gastrointestinal status for postoperative course will improve Outcome: Progressing   Problem: Cardiac: Goal: Ability to maintain an adequate cardiac output Outcome: Progressing Goal: Will show no evidence of cardiac arrhythmias Outcome: Progressing   Problem: Nutritional: Goal: Will attain and maintain optimal nutritional status Outcome: Progressing   Problem: Neurological: Goal: Will regain or maintain usual level of consciousness Outcome: Progressing   Problem: Clinical Measurements: Goal: Ability to maintain clinical measurements within normal limits Outcome: Progressing Goal: Postoperative complications will be avoided or minimized Outcome: Progressing   Problem: Respiratory: Goal: Will regain and/or maintain adequate ventilation Outcome: Progressing Goal: Respiratory status will improve Outcome: Progressing   Problem: Skin Integrity: Goal: Demonstrates signs of wound healing without infection Outcome: Progressing   Problem: Urinary Elimination: Goal: Will remain free from infection Outcome: Progressing Goal: Ability to achieve and maintain adequate urine output Outcome: Progressing   "

## 2024-09-26 NOTE — Evaluation (Signed)
 Occupational Therapy Evaluation Patient Details Name: Seth [name] MRN: [id number] DOB: [birth year] Today's Date: 09/26/2024   History of Present Illness   69 y.o. male presents to [facility] on 09/23/2024 with RLE critical ischemia and dry gangrene. Pt underwent RLE angioplasty on 1/28 and R great toe amputation on 1/29. PMH includes HTN, seizures, traumatic SDH, COPD.     Clinical Impressions Seth [name] was evaluated s/p the above admission list. He is mod I at ALF at baseline. Upon evaluation the pt was limited by R foot pain, unsteady balance, impaired balance and decreased activity tolerance. Overall he needed generalized supervision A for most aspects of functional mobility with RW, pt with slow/deliberate movements due to pain. Due to the deficits listed below the pt also needs CGA for LB ADLs including management of his post-op shoe. Pt will benefit from continued acute OT services and HHOT.      If plan is discharge home, recommend the following:   A little help with walking and/or transfers;A little help with bathing/dressing/bathroom;Assist for transportation;Two people to help with bathing/dressing/bathroom;Help with stairs or ramp for entrance     Functional Status Assessment   Patient has had a recent decline in their functional status and demonstrates the ability to make significant improvements in function in a reasonable and predictable amount of time.     Equipment Recommendations   None recommended by OT      Precautions/Restrictions   Precautions Precautions: Fall Recall of Precautions/Restrictions: Intact Required Braces or Orthoses: Other Brace Other Brace: R post-op shoe Restrictions Weight Bearing Restrictions Per Provider Order: Yes RLE Weight Bearing Per Provider Order: Weight bearing as tolerated Other Position/Activity Restrictions: WBAT in post-op shoe     Mobility Bed Mobility Overal bed mobility: Needs Assistance Bed Mobility: Sit to  Supine       Sit to supine: Modified independent (Device/Increase time)        Transfers Overall transfer level: Needs assistance Equipment used: Rolling [place] (2 wheels) Transfers: Sit to/from Stand Sit to Stand: Supervision           General transfer comment: from bed and toilet      Balance Overall balance assessment: Needs assistance Sitting-balance support: No upper extremity supported, Feet supported Sitting balance-[name] Scale: Good     Standing balance support: Bilateral upper extremity supported, Reliant on assistive device for balance Standing balance-[name] Scale: Poor             ADL either performed or assessed with clinical judgement   ADL Overall ADL's : Needs assistance/impaired Eating/Feeding: Independent   Grooming: Supervision/safety;Standing   Upper Body Bathing: Set up;Sitting   Lower Body Bathing: Contact guard assist;Sit to/from stand   Upper Body Dressing : Set up;Sitting   Lower Body Dressing: Contact guard assist;Sit to/from stand Lower Body Dressing Details (indicate cue type and reason): including post op shoe Toilet Transfer: Supervision/safety;Ambulation;Rolling [place] (2 wheels)   Toileting- Clothing Manipulation and Hygiene: Modified independent;Sitting/lateral lean       Functional mobility during ADLs: Supervision/safety;Rolling [place] (2 wheels) General ADL Comments: slowed/deliberate ambulation     Vision Baseline Vision/History: 0 No visual deficits Vision Assessment?: No apparent visual deficits     Perception Perception: Within Functional Limits       Praxis Praxis: WFL       Pertinent Vitals/Pain Pain Assessment Pain Assessment: Faces Faces Pain Scale: Hurts little more Pain Location: R foot Pain Descriptors / Indicators: Sore Pain Intervention(s): Monitored during session     Extremity/Trunk Assessment  Upper Extremity Assessment Upper Extremity Assessment: RUE deficits/detail;LUE  deficits/detail;Left hand dominant RUE Deficits / Details: hx of CVA, limited flex/ext at all joints, able to use functionally LUE Deficits / Details: hyperextension at thumb, LDH, limited over head reaching. generalized weakness   Lower Extremity Assessment Lower Extremity Assessment: Defer to PT evaluation   Cervical / Trunk Assessment Cervical / Trunk Assessment: Kyphotic   Communication Communication Communication: No apparent difficulties   Cognition Arousal: Alert Behavior During Therapy: WFL for tasks assessed/performed Cognition: No apparent impairments             OT - Cognition Comments: limited insight to safety however he did recall post op shoe wear schedule and purpose                 Following commands: Intact       Cueing  General Comments   Cueing Techniques: Verbal cues  VSS on RA           Home Living Family/patient expects to be discharged to:: Assisted living                             Home Equipment: Rolling [place] (2 wheels);Wheelchair - manual;Cane - quad;Cane - single point;Shower seat - built in;Grab bars - toilet;Grab bars - tub/shower;Tub bench (hemiwalker)   Additional Comments: at [facility]      Prior Functioning/Environment Prior Level of Function : Needs assist             Mobility Comments: ambulatory with use of RW ADLs Comments: assist for med management, meals provided. uses medical transport, does not drive    OT Problem List: Decreased strength;Decreased range of motion;Decreased activity tolerance;Decreased safety awareness   OT Treatment/Interventions: Self-care/ADL training;Therapeutic exercise;DME and/or AE instruction;Therapeutic activities;Patient/family education;Balance training      OT Goals(Current goals can be found in the care plan section)   Acute Rehab OT Goals Patient Stated Goal: home OT Goal Formulation: With patient Time For Goal Achievement: 10/10/24 Potential to  Achieve Goals: Good ADL Goals Additional ADL Goal #1: Pt will complete all ADLs with mod I and LRAD   OT Frequency:  Min 2X/week       AM-PAC OT 6 Clicks Daily Activity     Outcome Measure Help from another person eating meals?: None Help from another person taking care of personal grooming?: A Little Help from another person toileting, which includes using toliet, bedpan, or urinal?: A Little Help from another person bathing (including washing, rinsing, drying)?: A Little Help from another person to put on and taking off regular upper body clothing?: A Little Help from another person to put on and taking off regular lower body clothing?: A Little 6 Click Score: 19   End of Session Equipment Utilized During Treatment: Gait belt;Rolling [place] (2 wheels) Nurse Communication: Mobility status  Activity Tolerance: Patient tolerated treatment well Patient left: in bed;with bed alarm set;with call bell/phone within reach  OT Visit Diagnosis: Unsteadiness on feet (R26.81);Other abnormalities of gait and mobility (R26.89);Muscle weakness (generalized) (M62.81)                Time: 1211-1227 OT Time Calculation (min): 16 min Charges:  OT General Charges $OT Visit: 1 Visit OT Evaluation $OT Eval Low Complexity: 1 Low  Lucie Kendall, OTR/L Acute Rehabilitation Services Office [phone] Secure Chat Communication Preferred   Lucie JONETTA Kendall 09/26/2024, 12:40 PM

## 2024-09-26 NOTE — Progress Notes (Signed)
"  °  Subjective:  Patient ID: Seth [name], male    DOB: May 20, 1956,  MRN: [id number]  Chief Complaint  Patient presents with   Toe Pain    DOS: 09/25/2024 Procedure: Amputation right great toe at MPJ level  69 y.o. male seen for post op check. Pt reports he is doing well denies pain or other concerns. Discussed after care and follow up plans, all questions answered. He was living in a nursing facility and plans to go back there at DC.   Review of Systems: Negative except as noted in the HPI. Denies N/V/F/Ch.   Objective:   Constitutional Well developed. Well nourished.  Vascular Foot warm and well perfused. Capillary refill normal to all digits.   No calf pain with palpation  Neurologic Normal speech. Oriented to person, place, and time. Epicritic sensation diminished  Dermatologic Dressing c/d/I, the 4th toe had dried blood eschar on tuft toe at site of prior bleeding but well coagulated now.  Orthopedic: Status post right great toe amputation   Radiographs: Amputation right great toe at MPJ level  Pathology: Pending  Micro: Few gram-positive cocci  Assessment:   1. Toe infection   2. Gangrene of toe of right foot (HCC)   3. Critical limb ischemia of right lower extremity (HCC)   4. 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  POD # 1 s/p right great toe amp at MPJ level for gangrene of right great toe -Progressing as expected postoperatively.  There was some bleeding yesterday per RN change, new dressing is clean dry intact, no further bleeding from 4th toe. Per rn who change dsg yesterday amp site was intact/coapted no bleeding - Expect clean margin at amp site -XR: Expected postop changes -WB Status: Weightbearing as tolerated in postop shoe -Sutures: Remain intact 2 to 3 weeks. -Medications/ABX: Recommend  short course p.o. antibiotics Augmentin  and doxycycline  for 5 days, ordered -Dressing: leave Xeroform 4 x 4 Kerlix Ace wrap dressing leave intact until follow-up next week, ok to change at nursing facility prn   - F/u Plan: Patient stable for discharge from my standpoint,  follow-up next week in the office on Thursday office will call to arrange.  Will sign off        Marolyn JULIANNA Honour, DPM [facility]  "

## 2024-09-26 NOTE — [place] FL2 (Signed)
 " [place]  MEDICAID FL2 LEVEL OF CARE FORM     IDENTIFICATION  Patient Name: Seth [name] Birthdate: [birth year] Sex: male Admission Date (Current Location): 09/23/2024  [facility] and [place] Number:  Producer, Television/film/video and Address:  [facility] [place], [address], [place], [place] [address]      Provider Number: [id number]  Attending Physician Name and Address:  Patsy Lenis, MD  Relative Name and Phone Number:       Current Level of Care: Hospital Recommended Level of Care: Skilled Nursing Facility Prior Approval Number:    Date Approved/Denied:   PASRR Number: [id number]  Discharge Plan: Other (Comment) (ALF)    Current Diagnoses: Patient Active Problem List   Diagnosis Date Noted   Gangrene of toe of right foot (HCC) 09/24/2024   Dry gangrene (HCC) 09/23/2024   Electrolyte disturbance 07/25/2024   Closed displaced comminuted fracture of shaft of right humerus 12/28/2023   Cerebrovascular accident (CVA) (HCC) 12/28/2023   Generalized weakness 11/22/2023   General weakness 11/22/2023   Hyperlipidemia 12/20/2022   GERD (gastroesophageal reflux disease) 12/20/2022   Alcohol withdrawal syndrome without complication (HCC) 12/20/2022   Seizure (HCC) 11/29/2022   Seizure disorder (HCC) 10/18/2022   Asymptomatic bacteriuria 05/24/2022   HAP (hospital-acquired pneumonia) 05/23/2022   Multiple closed fractures of ribs of right side    Fall during current hospitalization 10/16/2021   PAD (peripheral artery disease)    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, not intractable, with status epilepticus (HCC)    Pseudoaneurysm of carotid artery    Seizures (HCC) 05/17/2021   Pressure injury of skin 03/14/2021   Essential hypertension    Chronic alcohol use 01/01/2021   History of CVA with residual RUE weakness 01/01/2021   COVID-19 virus infection 09/14/2020   Acute  osteomyelitis of left first metatarsal stump    AKI (acute kidney injury) 07/20/2020   Hypoglycemia 07/20/2020   Right sided weakness 02/19/2019   Hyperkalemia 02/19/2019   Left fibular fracture 02/19/2019   Alcohol use disorder 02/19/2019   Malnutrition of moderate degree 08/25/2017   Tobacco use disorder 08/23/2017   COPD (chronic obstructive pulmonary disease) (HCC) 08/23/2017    Orientation RESPIRATION BLADDER Height & Weight     Time, Self, Situation, Place  Normal Continent, External catheter Weight: 149 lb 14.6 oz (68 kg) Height:  6' 3 (190.5 cm)  BEHAVIORAL SYMPTOMS/MOOD NEUROLOGICAL BOWEL NUTRITION STATUS        Diet (regular diet)  AMBULATORY STATUS COMMUNICATION OF NEEDS Skin   Supervision Verbally Surgical wounds (toe/right anterior)                       Personal Care Assistance Level of Assistance  Bathing, Feeding, Dressing Bathing Assistance: Limited assistance Feeding assistance: Independent Dressing Assistance: Limited assistance     Functional Limitations Info  Sight, Hearing, Speech Sight Info: Adequate (glasses) Hearing Info: Adequate Speech Info: Adequate    SPECIAL CARE FACTORS FREQUENCY  PT (By licensed PT), OT (By licensed OT)     PT Frequency: 2x per week OT Frequency: 2x per week            Contractures Contractures Info: Not present    Additional Factors Info  Code Status, Allergies Code Status Info: FULL Allergies Info: NKA           Current Medications (09/26/2024):  This is the current hospital active medication list Current Facility-Administered Medications  Medication Dose Route Frequency Provider Last Rate Last Admin   acetaminophen  (TYLENOL ) tablet 650 mg  650 mg Oral Q4H PRN Robins, Joshua E, MD       albuterol  (PROVENTIL ) (2.5 MG/3ML) 0.083% nebulizer solution 2.5 mg  2.5 mg Nebulization Q2H PRN Zella, Mir M, MD       amLODipine  (NORVASC ) tablet 10 mg  10 mg Oral Daily Zella, Mir M, MD   10 mg at  09/26/24 1100   amoxicillin -clavulanate (AUGMENTIN ) 875-125 MG per tablet 1 tablet  1 tablet Oral BID Standiford, Alexander F, DPM   1 tablet at 09/26/24 1100   carvedilol  (COREG ) tablet 6.25 mg  6.25 mg Oral BID WC Zella, Mir M, MD   6.25 mg at 09/26/24 1059   cholecalciferol  (VITAMIN D3) 25 MCG (1000 UNIT) tablet 1,000 Units  1,000 Units Oral Daily Zella, Mir M, MD   1,000 Units at 09/26/24 1100   clopidogrel  (PLAVIX ) tablet 75 mg  75 mg Oral Q breakfast Robins, Joshua E, MD   75 mg at 09/26/24 1100   docusate sodium  (COLACE) capsule 100 mg  100 mg Oral BID Zella, Mir M, MD   100 mg at 09/25/24 2157   doxycycline  (VIBRA -TABS) tablet 100 mg  100 mg Oral Q12H StandifordMarsa FALCON, DPM   100 mg at 09/26/24 1100   gabapentin  (NEURONTIN ) capsule 300 mg  300 mg Oral TID Zella Katha HERO, MD   300 mg at 09/26/24 1059   heparin  injection 5,000 Units  5,000 Units Subcutaneous Q8H Gretel Prentice BIRCH, RPH   5,000 Units at 09/26/24 0507   hydrALAZINE  (APRESOLINE ) injection 5 mg  5 mg Intravenous Q20 Min PRN Robins, Joshua E, MD       labetalol  (NORMODYNE ) injection 10 mg  10 mg Intravenous Q10 min PRN Robins, Joshua E, MD       lacosamide  (VIMPAT ) tablet 200 mg  200 mg Oral BID Ikramullah, Mir M, MD   200 mg at 09/26/24 1101   levETIRAcetam  (KEPPRA ) tablet 2,000 mg  2,000 mg Oral BID Zella, Mir M, MD   2,000 mg at 09/25/24 2229   ondansetron  (ZOFRAN ) tablet 4 mg  4 mg Oral Q6H PRN Zella Katha HERO, MD   4 mg at 09/24/24 1108   Or   ondansetron  (ZOFRAN ) injection 4 mg  4 mg Intravenous Q6H PRN Zella, Mir M, MD       oxyCODONE  (Oxy IR/ROXICODONE ) immediate release tablet 5 mg  5 mg Oral Q4H PRN Patsy Lenis, MD       pantoprazole  (PROTONIX ) EC tablet 40 mg  40 mg Oral Daily Ikramullah, Mir M, MD   40 mg at 09/26/24 1100   PHENobarbital  (LUMINAL) tablet 64.8 mg  64.8 mg Oral BID Ikramullah, Mir M, MD   64.8 mg at 09/26/24 1100   polyethylene glycol (MIRALAX  / GLYCOLAX ) packet 17 g   17 g Oral Daily PRN Zella, Mir M, MD       rosuvastatin  (CRESTOR ) tablet 40 mg  40 mg Oral Daily Gretel Prentice BIRCH, RPH   40 mg at 09/26/24 1101   sodium chloride  flush (NS) 0.9 % injection 3 mL  3 mL Intravenous Q12H Robins, Joshua E, MD   3 mL at 09/26/24 1104   sodium chloride  flush (NS) 0.9 % injection 3 mL  3 mL Intravenous PRN Robins, Joshua E, MD       traZODone  (DESYREL ) tablet 25 mg  25 mg Oral QHS PRN Zella Katha HERO, MD   25  mg at 09/23/24 2224     Discharge Medications: Please see discharge summary for a list of discharge medications.  Relevant Imaging Results:  Relevant Lab Results:   Additional Information SSN #  [id number]  [name], LCSW     "

## 2024-09-26 NOTE — Evaluation (Signed)
 Physical Therapy Evaluation Patient Details Name: Seth [name] MRN: [id number] DOB: Feb 01, 1956 Today's Date: 09/26/2024  History of Present Illness  69 y.o. male presents to [facility] hospital on 09/23/2024 with RLE critical ischemia and dry gangrene. Pt underwent RLE angioplasty on 1/28 and R great toe amputation on 1/29. PMH includes HTN, seizures, traumatic SDH, COPD.  Clinical Impression  Pt presents to PT with deficits in gait, balance, power, endurance. Pt is able to ambulate for household distances with support of RW and does not require physical assistance for transfers at this time. Pt will benefit from HHPT for further dynamic balance training given new amputation and prior L TMA.         If plan is discharge home, recommend the following: A little help with bathing/dressing/bathroom;Assistance with cooking/housework;Assist for transportation;Help with stairs or ramp for entrance;Direct supervision/assist for medications management   Can travel by private vehicle        Equipment Recommendations None recommended by PT  Recommendations for Other Services       Functional Status Assessment Patient has had a recent decline in their functional status and demonstrates the ability to make significant improvements in function in a reasonable and predictable amount of time.     Precautions / Restrictions Precautions Precautions: Fall Recall of Precautions/Restrictions: Intact Required Braces or Orthoses: Other Brace Other Brace: R post-op shoe Restrictions Weight Bearing Restrictions Per Provider Order: Yes RLE Weight Bearing Per Provider Order: Weight bearing as tolerated Other Position/Activity Restrictions: WBAT in post-op shoe      Mobility  Bed Mobility                    Transfers Overall transfer level: Needs assistance Equipment used: Rolling Kindley (2 wheels) Transfers: Sit to/from Stand Sit to Stand: Contact guard assist           General transfer  comment: pt stands form low bed height, increased time and effort to rise into standing    Ambulation/Gait Ambulation/Gait assistance: Supervision Gait Distance (Feet): 150 Feet Assistive device: Rolling Thurow (2 wheels) Gait Pattern/deviations: Step-to pattern Gait velocity: reduced Gait velocity interpretation: <1.8 ft/sec, indicate of risk for recurrent falls   General Gait Details: slowed step-to gait  Stairs            Wheelchair Mobility     Tilt Bed    Modified Rankin (Stroke Patients Only)       Balance Overall balance assessment: Needs assistance Sitting-balance support: No upper extremity supported, Feet supported Sitting balance-[name] Scale: Good     Standing balance support: Bilateral upper extremity supported, Reliant on assistive device for balance Standing balance-[name] Scale: Poor                               Pertinent Vitals/Pain Pain Assessment Pain Assessment: 0-10 Pain Score: 8  Pain Location: R foot Pain Descriptors / Indicators: Sore Pain Intervention(s): Monitored during session    Home Living Family/patient expects to be discharged to:: Assisted living                 Home Equipment: Agricultural Consultant (2 wheels);Wheelchair - manual;Cane - quad;Cane - single point;Shower seat - built in;Grab bars - toilet;Grab bars - tub/shower;Tub bench (hemiwalker) Additional Comments: at [facility]    Prior Function Prior Level of Function : Needs assist             Mobility Comments: ambulatory with use of  RW ADLs Comments: assist for med management, meals provided     Extremity/Trunk Assessment   Upper Extremity Assessment Upper Extremity Assessment: RUE deficits/detail RUE Deficits / Details: pt is able to actively flex R shoulder to ~100 degrees, elbow ROM WFL, pt lacks full digits extension in digits 3-5. strength grossly 3/5 through available range    Lower Extremity Assessment Lower Extremity Assessment:  Generalized weakness    Cervical / Trunk Assessment Cervical / Trunk Assessment: Kyphotic  Communication   Communication Communication: No apparent difficulties    Cognition Arousal: Alert Behavior During Therapy: WFL for tasks assessed/performed   PT - Cognitive impairments: No apparent impairments                         Following commands: Intact       Cueing Cueing Techniques: Verbal cues     General Comments General comments (skin integrity, edema, etc.): VSS on RA    Exercises     Assessment/Plan    PT Assessment Patient needs continued PT services  PT Problem List Decreased strength;Decreased activity tolerance;Decreased balance;Decreased mobility;Pain       PT Treatment Interventions DME instruction;Gait training;Therapeutic activities;Functional mobility training;Therapeutic exercise;Neuromuscular re-education;Balance training;Patient/family education    PT Goals (Current goals can be found in the Care Plan section)  Acute Rehab PT Goals Patient Stated Goal: to return home PT Goal Formulation: With patient Time For Goal Achievement: 10/10/24 Potential to Achieve Goals: Good    Frequency Min 2X/week     Co-evaluation               AM-PAC PT 6 Clicks Mobility  Outcome Measure Help needed turning from your back to your side while in a flat bed without using bedrails?: A Little Help needed moving from lying on your back to sitting on the side of a flat bed without using bedrails?: A Little Help needed moving to and from a bed to a chair (including a wheelchair)?: A Little Help needed standing up from a chair using your arms (e.g., wheelchair or bedside chair)?: A Little Help needed to walk in hospital room?: A Little Help needed climbing 3-5 steps with a railing? : A Lot 6 Click Score: 17    End of Session Equipment Utilized During Treatment: Gait belt Activity Tolerance: Patient tolerated treatment well Patient left:  (left in  bathroom, OT present to evaluate) Nurse Communication: Mobility status PT Visit Diagnosis: Other abnormalities of gait and mobility (R26.89);Muscle weakness (generalized) (M62.81);Pain Pain - Right/Left: Right Pain - part of body: Ankle and joints of foot    Time: 8844-8784 PT Time Calculation (min) (ACUTE ONLY): 20 min   Charges:   PT Evaluation $PT Eval Low Complexity: 1 Low   PT General Charges $$ ACUTE PT VISIT: 1 Visit         Bernardino JINNY Ruth, PT, DPT Acute Rehabilitation Office [phone]   Bernardino JINNY Ruth 09/26/2024, 12:24 PM

## 2024-09-26 NOTE — Progress Notes (Signed)
 " Progress Note    Seth [name]   FMW:[id number]  DOB: 30-Dec-1955  DOA: 09/23/2024     3 PCP: [name], NP  Initial CC: Right foot infection  Hospital Course: Seth [name] is a 69 y.o. 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  He has been followed as an outpatient by his podiatrist Dr. Malvin, as well as vascular surgery.  He recently developed a wound of the right great toe after he got a new pair of shoes, this has gradually developed into gangrene.   He has been followed closely by vascular surgery as an outpatient, they were planning outpatient right lower extremity angiogram with possible SFA/popliteal intervention in the next few weeks.  During his recent vascular surgery clinic visit, he did complain of some purulent drainage from between his 1st and 2nd toes on the right foot, he was noted at his facility today to have fever.  Although the patient denies any chills, fevers, nausea, vomiting, he does state that pain at the medial side of his right foot around the great toe has started to increase.  He presented to the emergency department from his facility, vascular surgery recommends admission to [facility] for urgent revascularization and intervention.     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  Patient has no frank clinical evidence of acute infection, though he does have a small amount of pus being expressed from the wound as well as reported fever previously.   - Will cover empirically, although no frank clinical evidence of acute infection, not septic. -Empiric IV Zosyn  and IV vancomycin ; transition antibiotics to Augmentin  and doxycycline  to complete 5 more days - Underwent angiogram with vascular surgery  on 1/28.  Angioplasty performed to SFA, posterior tibial, and popliteal artery; no stents - Successfully underwent amputation of right great toe on 09/25/2024 with vascular surgery -Evaluated by PT/OT; recommended for home health.  No DME needs - WBAT RLE in postop shoe - Sutures to remain for 2 to 3 weeks.  Dressing: Dressing: leave Xeroform 4 x 4 Kerlix Ace wrap dressing leave intact until follow-up next week. Tentative for ~2/5 - regarding dispo: evaluated by Fredick, unable to return due to recent amputation and wound care status as well as concern for fall risk; will have to now go to SNF first before returning to [facility]. Continue in hospital until SNF found   Hypertension-continue Coreg , amlodipine    Seizure disorder-continue home Keppra , Vimpat , luminal, gabapentin    Hyperlipidemia-Crestor     Interval History:  No events overnight.  Tolerated surgery well yesterday.  Evaluated by PT and OT today also. Evaluated by Fredick and unable to return due to concern for wound care status and fall risk.  Needing to go to SNF first per [facility].  Antimicrobials: Zosyn  09/23/2024 >> 09/26/2024 Vancomycin  09/23/2024 >> 09/26/2024 Augmentin  09/26/2024 >> current Doxycycline  09/26/2024 >> current  Consultants:  Podiatry  Vascular surgery  Procedures:  09/24/2024: Angiogram with balloon angioplasty 09/25/2024: Amputation right great toe at MPJ level  DVT prophylaxis:  heparin  injection 5,000 Units Start: 09/25/24 2200   Code Status:   Code Status: Full Code  Barriers to discharge: None Therapy evaluation: PT Orders:   PT Follow up Rec: Home Health Pt1/30/2026 1100  Disposition Plan: SNF? Status is: Inpatient  Mobility Assessment (Last 72 Hours)     Mobility Assessment     Row Name 09/26/24 1200 09/26/24 1100 09/26/24 0820 09/25/24 2157 09/25/24 1545   Does the patient have exclusion criteria? -- -- No- Perform mobility assessment No- Perform mobility assessment No-  Perform mobility assessment   What is the highest level of mobility based on the mobility assessment? Level 4 (Ambulates with assistance) - Balance while stepping forward/back - Complete Level 4 (Ambulates with assistance) - Balance while stepping forward/back - Complete Level 4 (Ambulates with assistance) - Balance while stepping forward/back - Complete Level 4 (Ambulates with assistance) - Balance while stepping forward/back - Complete Level 4 (Ambulates with assistance) - Balance while stepping forward/back - Complete    Row Name 09/25/24 0720 09/24/24 2022 09/24/24 1822 09/24/24 1010 09/23/24 2200   Does the patient have exclusion criteria? No- Perform mobility assessment No- Perform mobility assessment No- Perform mobility assessment No- Perform mobility assessment No- Perform mobility assessment   What is the highest level of mobility based on the mobility assessment? Level 4 (Ambulates with assistance) - Balance while stepping forward/back - Complete Level 4 (Ambulates with assistance) - Balance while stepping forward/back - Complete Level 4 (Ambulates with assistance) - Balance while stepping forward/back - Complete Level 4 (Ambulates with assistance) - Balance while stepping forward/back - Complete Level 4 (Ambulates with assistance) - Balance while stepping forward/back - Complete      Diet: Diet Orders (From admission, onward)     Start     Ordered   09/25/24 1249  Diet regular Room service appropriate? Yes; Fluid consistency: Thin  Diet effective now       Question Answer Comment  Room service appropriate? Yes   Fluid consistency: Thin      09/25/24 1248            Objective: Blood pressure (!) 146/89, pulse 90, temperature 98.7 F (37.1 C), temperature source Oral, resp. rate 15, height 6' 3 (1.905 m), weight 68 kg, SpO2 99%.  Examination:  Physical Exam Constitutional:      General: He is not in acute distress.    Appearance: Normal appearance.  HENT:     Head:  Normocephalic and atraumatic.     Mouth/Throat:     Mouth: Mucous membranes are moist.  Eyes:     Extraocular Movements: Extraocular movements intact.  Cardiovascular:     Rate and Rhythm: Normal rate and regular rhythm.  Pulmonary:     Effort: Pulmonary effort is normal. No respiratory distress.     Breath sounds: Normal breath sounds. No wheezing.  Abdominal:     General: Bowel sounds are normal. There is no distension.     Palpations: Abdomen is soft.     Tenderness: There is no abdominal tenderness.  Musculoskeletal:     Cervical back: Normal range of motion and neck supple.     Comments: Right foot wrapped in surgical dressing.  No significant bleeding noted from any digits   Skin:    General: Skin is warm and dry.  Neurological:     General: No focal deficit present.     Mental Status: He is alert.  Psychiatric:        Mood and Affect: Mood normal.        Behavior: Behavior normal.      Data Reviewed: Results for orders placed or performed during the hospital encounter of 09/23/24 (from the past 24 hours)  Glucose, capillary     Status: None  Collection Time: 09/26/24 12:46 PM  Result Value Ref Range   Glucose-Capillary 83 70 - 99 mg/dL    I have reviewed pertinent nursing notes, vitals, labs, and images as necessary. I have ordered labwork to follow up on as indicated.  I have reviewed the last notes from staff over past 24 hours. I have discussed patient's care plan and test results with nursing staff, CM/SW, and other staff as appropriate.  Old records reviewed in assessment of this patient  Time spent: Greater than 50% of the 55 minute visit was spent in counseling/coordination of care for the patient as laid out in the A&P.   LOS: 3 days   Alm Apo, MD [facility] 09/26/2024, 2:57 PM "

## 2024-09-26 NOTE — TOC Initial Note (Signed)
 Transition of Care [facility]) - Initial/Assessment Note    Patient Details  Name: Seth [name] MRN: [id number] Date of Birth: 1956-05-14  Transition of Care [facility]) CM/SW Contact:    [name], LCSW Phone Number: 09/26/2024, 1:14 PM  Clinical Narrative:                 11:30 am- CSW met with patient at bedside, introduced self and explained role. Patient confirmed he resides at [facility] and is expected to return once stable. CSW explained waiting on PT recommendations and hopefully he will be able to return. If recommended for SNF patient states he would be agreeable to go- preferred facility [name]. All questions answered.   12:30pm - spoke with Care Manager Joen at [facility]- informed patient was stable for d/c . She advised, she is coming today to complete required assessment before a determination is made SNF vs ALF. CSW spoke with Rowena, Health and Therapist, Art, she states they were concerned about his mobility and fall risk at the ALF. CSW explained therapy recommends HH and explained he would require insurance approval for SNF.   2:35pm- met with Care Manager Joen- she states ALF will not be able to manage his wound care and is cornered about his safety (fall risk). They have requested to pursue short term rehab at [facility] before returning to [place].  TOC will provide bed offer once available   Montie Louder, MSW, LCSW Clinical Social Worker    Expected Discharge Plan: Assisted Living Barriers to Discharge:  (ALF will come and asses the patient for safety berfore he can return)   Patient Goals and CMS Choice            Expected Discharge Plan and Services In-house Referral: Clinical Social Work     Living arrangements for the past 2 months: Assisted Living Facility                                      Prior Living Arrangements/Services Living arrangements for the past 2 months: Assisted Living Facility Lives with:: Self,  Facility Resident Patient language and need for interpreter reviewed:: No        Need for Family Participation in Patient Care: Yes (Comment) Care giver support system in place?: Yes (comment)   Criminal Activity/Legal Involvement Pertinent to Current Situation/Hospitalization: No - Comment as needed  Activities of Daily Living   ADL Screening (condition at time of admission) Independently performs ADLs?: No Does the patient have a NEW difficulty with bathing/dressing/toileting/self-feeding that is expected to last >3 days?: Yes (Initiates electronic notice to provider for possible OT consult) Does the patient have a NEW difficulty with getting in/out of bed, walking, or climbing stairs that is expected to last >3 days?: Yes (Initiates electronic notice to provider for possible PT consult) Does the patient have a NEW difficulty with communication that is expected to last >3 days?: No Is the patient deaf or have difficulty hearing?: No Does the patient have difficulty seeing, even when wearing glasses/contacts?: No Does the patient have difficulty concentrating, remembering, or making decisions?: No  Permission Sought/Granted Permission sought to share information with : Family Supports Permission granted to share information with : Yes, Verbal Permission Granted  Share Information with NAME: Jamaar Howes  Permission granted to share info w AGENCY: ALF  Permission granted to share info w Relationship: sister  Permission granted to share info  w Contact Information: [phone]  Emotional Assessment   Attitude/Demeanor/Rapport: Engaged Affect (typically observed): Accepting, Appropriate Orientation: : Oriented to Self, Oriented to Place, Oriented to  Time, Oriented to Situation Alcohol / Substance Use: Not Applicable Psych Involvement: No (comment)  Admission diagnosis:  Dry gangrene (HCC) [I96] Toe infection [L08.9] Patient Active Problem List   Diagnosis Date Noted   Gangrene  of toe of right foot (HCC) 09/24/2024   Dry gangrene (HCC) 09/23/2024   Electrolyte disturbance 07/25/2024   Closed displaced comminuted fracture of shaft of right humerus 12/28/2023   Cerebrovascular accident (CVA) (HCC) 12/28/2023   Generalized weakness 11/22/2023   General weakness 11/22/2023   Hyperlipidemia 12/20/2022   GERD (gastroesophageal reflux disease) 12/20/2022   Alcohol withdrawal syndrome without complication (HCC) 12/20/2022   Seizure (HCC) 11/29/2022   Seizure disorder (HCC) 10/18/2022   Asymptomatic bacteriuria 05/24/2022   HAP (hospital-acquired pneumonia) 05/23/2022   Multiple closed fractures of ribs of right side    Fall during current hospitalization 10/16/2021   PAD (peripheral artery disease)    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, not intractable, with status epilepticus (HCC)    Pseudoaneurysm of carotid artery    Seizures (HCC) 05/17/2021   Pressure injury of skin 03/14/2021   Essential hypertension    Chronic alcohol use 01/01/2021   History of CVA with residual RUE weakness 01/01/2021   COVID-19 virus infection 09/14/2020   Acute osteomyelitis of left first metatarsal stump    AKI (acute kidney injury) 07/20/2020   Hypoglycemia 07/20/2020   Right sided weakness 02/19/2019   Hyperkalemia 02/19/2019   Left fibular fracture 02/19/2019   Alcohol use disorder 02/19/2019   Malnutrition of moderate degree 08/25/2017   Tobacco use disorder 08/23/2017   COPD (chronic obstructive pulmonary disease) (HCC) 08/23/2017   PCP:  [name], NP Pharmacy:   [facility] - [place], [place] - [address] [place] [address] Phone: [phone] Fax: [phone]  [facility] [address] [place] [address] Phone: [phone] Fax: [phone]  [facility] - [place], [place] - [phone] [address] [place] [address] Phone: [phone] Fax: [phone]     Social Drivers of Health (SDOH) Social History: SDOH Screenings   Food Insecurity: No Food Insecurity (09/23/2024)  Housing: Low Risk (09/23/2024)  Transportation Needs: No Transportation Needs (09/23/2024)  Utilities: Not At Risk (09/23/2024)  Social Connections: Socially Isolated (09/23/2024)  Tobacco Use: High Risk (09/25/2024)   SDOH Interventions: Social Connections Interventions: Community Resources Provided, Inpatient TOC   Readmission Risk Interventions    12/22/2022    4:22 PM  Readmission Risk Prevention Plan  Transportation Screening Complete  HRI or Home Care Consult Complete  Social Work Consult for Recovery Care Planning/Counseling Complete  Palliative Care Screening Not Applicable  Medication Review Oceanographer) Complete

## 2024-09-27 DIAGNOSIS — I96 Gangrene, not elsewhere classified: Secondary | ICD-10-CM | POA: Diagnosis not present

## 2024-09-27 NOTE — TOC Progression Note (Signed)
 Transition of Care [facility]) - Progression Note    Patient Details  Name: Seth [name] MRN: [id number] Date of Birth: Oct 01, 1955  Transition of Care [facility]) CM/SW Contact  [name], [place] Phone Number: 09/27/2024, 10:25 AM  Clinical Narrative:     Insurance authorization still pending for [name].   Expected Discharge Plan: Skilled Nursing Facility Barriers to Discharge: Insurance Authorization               Expected Discharge Plan and Services In-house Referral: Clinical Social Work     Living arrangements for the past 2 months: Assisted Living Facility                                       Social Drivers of Health (SDOH) Interventions SDOH Screenings   Food Insecurity: No Food Insecurity (09/23/2024)  Housing: Low Risk (09/23/2024)  Transportation Needs: No Transportation Needs (09/23/2024)  Utilities: Not At Risk (09/23/2024)  Social Connections: Socially Isolated (09/23/2024)  Tobacco Use: High Risk (09/25/2024)    Readmission Risk Interventions    12/22/2022    4:22 PM  Readmission Risk Prevention Plan  Transportation Screening Complete  HRI or Home Care Consult Complete  Social Work Consult for Recovery Care Planning/Counseling Complete  Palliative Care Screening Not Applicable  Medication Review Oceanographer) Complete

## 2024-09-27 NOTE — Progress Notes (Signed)
 " Progress Note    Seth [name]   FMW:[id number]  DOB: 1956-01-29  DOA: 09/23/2024     4 PCP: [name], NP  Initial CC: Right foot infection  Hospital Course: Seth [name] is a 69 y.o. 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  He has been followed as an outpatient by his podiatrist Dr. Malvin, as well as vascular surgery.  He recently developed a wound of the right great toe after he got a new pair of shoes, this has gradually developed into gangrene.   He has been followed closely by vascular surgery as an outpatient, they were planning outpatient right lower extremity angiogram with possible SFA/popliteal intervention in the next few weeks.  During his recent vascular surgery clinic visit, he did complain of some purulent drainage from between his 1st and 2nd toes on the right foot, he was noted at his facility today to have fever.  Although the patient denies any chills, fevers, nausea, vomiting, he does state that pain at the medial side of his right foot around the great toe has started to increase.  He presented to the emergency department from his facility, vascular surgery recommends admission to [facility] for urgent revascularization and intervention.     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  Patient has no frank clinical evidence of acute infection, though he does have a small amount of pus being expressed from the wound as well as reported fever previously.   - Will cover empirically, although no frank clinical evidence of acute infection, not septic. -Empiric IV Zosyn  and IV vancomycin ; transition antibiotics to Augmentin  and doxycycline  to complete 5 more days - Underwent angiogram with vascular surgery  on 1/28.  Angioplasty performed to SFA, posterior tibial, and popliteal artery; no stents - Successfully underwent amputation of right great toe on 09/25/2024 with vascular surgery -Evaluated by PT/OT; recommended for home health.  No DME needs - WBAT RLE in postop shoe - Sutures to remain for 2 to 3 weeks.  Dressing: Dressing: leave Xeroform 4 x 4 Kerlix Ace wrap dressing leave intact until follow-up next week. Tentative for ~2/5 - regarding dispo: evaluated by Fredick, unable to return due to recent amputation and wound care status as well as concern for fall risk; will have to now go to SNF first before returning to [facility]. Continue in hospital until SNF found   Hypertension-continue Coreg , amlodipine    Seizure disorder-continue home Keppra , Vimpat , luminal, gabapentin    Hyperlipidemia-Crestor     Interval History:  No events overnight.  Comfortably resting in bed.  Awaiting SNF placement otherwise stable.  Antimicrobials: Zosyn  09/23/2024 >> 09/26/2024 Vancomycin  09/23/2024 >> 09/26/2024 Augmentin  09/26/2024 >> current Doxycycline  09/26/2024 >> current  Consultants:  Podiatry  Vascular surgery  Procedures:  09/24/2024: Angiogram with balloon angioplasty 09/25/2024: Amputation right great toe at MPJ level  DVT prophylaxis:  heparin  injection 5,000 Units Start: 09/25/24 2200   Code Status:   Code Status: Full Code  Barriers to discharge: None Therapy evaluation: PT Orders:   PT Follow up Rec: Home Health Pt1/30/2026 1100  Disposition Plan: SNF Status is: Inpatient  Mobility Assessment (Last 72 Hours)     Mobility Assessment     Row Name 09/26/24 2000 09/26/24 1200 09/26/24 1100 09/26/24 0820 09/25/24 2157  Does the patient have exclusion criteria? No- Perform mobility assessment -- -- No- Perform mobility assessment No- Perform mobility assessment   What is the highest level of mobility based on the mobility assessment? Level 4 (Ambulates with assistance) -  Balance while stepping forward/back - Complete Level 4 (Ambulates with assistance) - Balance while stepping forward/back - Complete Level 4 (Ambulates with assistance) - Balance while stepping forward/back - Complete Level 4 (Ambulates with assistance) - Balance while stepping forward/back - Complete Level 4 (Ambulates with assistance) - Balance while stepping forward/back - Complete    Row Name 09/25/24 1545 09/25/24 0720 09/24/24 2022 09/24/24 1822     Does the patient have exclusion criteria? No- Perform mobility assessment No- Perform mobility assessment No- Perform mobility assessment No- Perform mobility assessment    What is the highest level of mobility based on the mobility assessment? Level 4 (Ambulates with assistance) - Balance while stepping forward/back - Complete Level 4 (Ambulates with assistance) - Balance while stepping forward/back - Complete Level 4 (Ambulates with assistance) - Balance while stepping forward/back - Complete Level 4 (Ambulates with assistance) - Balance while stepping forward/back - Complete       Diet: Diet Orders (From admission, onward)     Start     Ordered   09/25/24 1249  Diet regular Room service appropriate? Yes; Fluid consistency: Thin  Diet effective now       Question Answer Comment  Room service appropriate? Yes   Fluid consistency: Thin      09/25/24 1248            Objective: Blood pressure 125/81, pulse 91, temperature 98.2 F (36.8 C), temperature source Oral, resp. rate 19, height 6' 3 (1.905 m), weight 68 kg, SpO2 99%.  Examination:  Physical Exam Constitutional:      General: He is not in acute distress.    Appearance: Normal appearance.  HENT:     Head: Normocephalic and atraumatic.     Mouth/Throat:     Mouth: Mucous membranes are moist.  Eyes:     Extraocular Movements: Extraocular movements intact.  Cardiovascular:     Rate and Rhythm: Normal rate and regular rhythm.  Pulmonary:     Effort: Pulmonary effort is  normal. No respiratory distress.     Breath sounds: Normal breath sounds. No wheezing.  Abdominal:     General: Bowel sounds are normal. There is no distension.     Palpations: Abdomen is soft.     Tenderness: There is no abdominal tenderness.  Musculoskeletal:     Cervical back: Normal range of motion and neck supple.     Comments: Right foot wrapped in surgical dressing.  No significant bleeding noted from any digits   Skin:    General: Skin is warm and dry.  Neurological:     General: No focal deficit present.     Mental Status: He is alert.  Psychiatric:        Mood and Affect: Mood normal.        Behavior: Behavior normal.      Data Reviewed: Results for orders placed or performed during the hospital encounter of 09/23/24 (from the past 24 hours)  Glucose, capillary     Status: None   Collection Time: 09/26/24 12:46 PM  Result Value Ref Range   Glucose-Capillary 83 70 - 99 mg/dL    I have reviewed pertinent nursing notes, vitals, labs, and images as necessary. I have ordered labwork to follow up on as  indicated.  I have reviewed the last notes from staff over past 24 hours. I have discussed patient's care plan and test results with nursing staff, CM/SW, and other staff as appropriate.  Old records reviewed in assessment of this patient  Time spent: Greater than 50% of the 55 minute visit was spent in counseling/coordination of care for the patient as laid out in the A&P.   LOS: 4 days   Alm Apo, MD [facility] 09/27/2024, 12:27 PM "

## 2024-09-27 NOTE — Plan of Care (Signed)
" °  Problem: Education: Goal: Knowledge of General Education information will improve Description: Including pain rating scale, medication(s)/side effects and non-pharmacologic comfort measures Outcome: Progressing   Problem: Health Behavior/Discharge Planning: Goal: Ability to manage health-related needs will improve Outcome: Progressing   Problem: Clinical Measurements: Goal: Ability to maintain clinical measurements within normal limits will improve Outcome: Progressing Goal: Will remain free from infection Outcome: Progressing Goal: Diagnostic test results will improve Outcome: Progressing Goal: Respiratory complications will improve Outcome: Progressing Goal: Cardiovascular complication will be avoided Outcome: Progressing   Problem: Activity: Goal: Risk for activity intolerance will decrease Outcome: Progressing   Problem: Nutrition: Goal: Adequate nutrition will be maintained Outcome: Progressing   Problem: Coping: Goal: Level of anxiety will decrease Outcome: Progressing   Problem: Elimination: Goal: Will not experience complications related to bowel motility Outcome: Progressing Goal: Will not experience complications related to urinary retention Outcome: Progressing   Problem: Pain Managment: Goal: General experience of comfort will improve and/or be controlled Outcome: Progressing   Problem: Safety: Goal: Ability to remain free from injury will improve Outcome: Progressing   Problem: Skin Integrity: Goal: Risk for impaired skin integrity will decrease Outcome: Progressing   Problem: Education: Goal: Understanding of CV disease, CV risk reduction, and recovery process will improve Outcome: Progressing Goal: Individualized Educational Video(s) Outcome: Progressing   Problem: Activity: Goal: Ability to return to baseline activity level will improve Outcome: Progressing   Problem: Cardiovascular: Goal: Ability to achieve and maintain adequate  cardiovascular perfusion will improve Outcome: Progressing Goal: Vascular access site(s) Level 0-1 will be maintained Outcome: Progressing   Problem: Health Behavior/Discharge Planning: Goal: Ability to safely manage health-related needs after discharge will improve Outcome: Progressing   Problem: Education: Goal: Knowledge of the prescribed therapeutic regimen will improve Outcome: Progressing   Problem: Bowel/Gastric: Goal: Gastrointestinal status for postoperative course will improve Outcome: Progressing   Problem: Cardiac: Goal: Ability to maintain an adequate cardiac output Outcome: Progressing Goal: Will show no evidence of cardiac arrhythmias Outcome: Progressing   Problem: Nutritional: Goal: Will attain and maintain optimal nutritional status Outcome: Progressing   Problem: Neurological: Goal: Will regain or maintain usual level of consciousness Outcome: Progressing   Problem: Clinical Measurements: Goal: Ability to maintain clinical measurements within normal limits Outcome: Progressing Goal: Postoperative complications will be avoided or minimized Outcome: Progressing   Problem: Respiratory: Goal: Will regain and/or maintain adequate ventilation Outcome: Progressing Goal: Respiratory status will improve Outcome: Progressing   Problem: Skin Integrity: Goal: Demonstrates signs of wound healing without infection Outcome: Progressing   Problem: Urinary Elimination: Goal: Will remain free from infection Outcome: Progressing Goal: Ability to achieve and maintain adequate urine output Outcome: Progressing   "

## 2024-09-28 DIAGNOSIS — I96 Gangrene, not elsewhere classified: Secondary | ICD-10-CM | POA: Diagnosis not present

## 2024-09-28 LAB — CULTURE, BLOOD (ROUTINE X 2)
Culture: NO GROWTH
Culture: NO GROWTH

## 2024-09-28 NOTE — Progress Notes (Signed)
 " Progress Note    Seth [name]   FMW:[id number]  DOB: 10/14/1955  DOA: 09/23/2024     5 PCP: [name], NP  Initial CC: Right foot infection  Hospital Course: Seth [name] is a 69 y.o. 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  He has been followed as an outpatient by his podiatrist Dr. Malvin, as well as vascular surgery.  He recently developed a wound of the right great toe after he got a new pair of shoes, this has gradually developed into gangrene.   He has been followed closely by vascular surgery as an outpatient, they were planning outpatient right lower extremity angiogram with possible SFA/popliteal intervention in the next few weeks.  During his recent vascular surgery clinic visit, he did complain of some purulent drainage from between his 1st and 2nd toes on the right foot, he was noted at his facility today to have fever.  Although the patient denies any chills, fevers, nausea, vomiting, he does state that pain at the medial side of his right foot around the great toe has started to increase.  He presented to the emergency department from his facility, vascular surgery recommends admission to [facility] for urgent revascularization and intervention.     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  Patient has no frank clinical evidence of acute infection, though he does have a small amount of pus being expressed from the wound as well as reported fever previously.   - Will cover empirically, although no frank clinical evidence of acute infection, not septic. -Empiric IV Zosyn  and IV vancomycin ; transition antibiotics to Augmentin  and doxycycline  to complete 5 more days; culture noted to be growing few E. Faecalis  (sens to amp) and staph aureus  - Underwent angiogram with vascular surgery on 1/28.  Angioplasty performed to SFA, posterior tibial, and popliteal artery; no stents - Successfully underwent amputation of right great toe on 09/25/2024 with vascular surgery -Evaluated by PT/OT; recommended for home health.  No DME needs - WBAT RLE in postop shoe - Sutures to remain for 2 to 3 weeks.  Dressing: Dressing: leave Xeroform 4 x 4 Kerlix Ace wrap dressing leave intact until follow-up next week. Tentative for ~2/5 - regarding dispo: evaluated by Fredick, unable to return due to recent amputation and wound care status as well as concern for fall risk; will have to now go to SNF first before returning to [facility]. Continue in hospital until SNF found   Hypertension-continue Coreg , amlodipine    Seizure disorder-continue home Keppra , Vimpat , luminal, gabapentin    Hyperlipidemia-Crestor   Interval History:  No events overnight.  No issues today.  Hanging out and waiting on SNF.  Antimicrobials: Zosyn  09/23/2024 >> 09/26/2024 Vancomycin  09/23/2024 >> 09/26/2024 Augmentin  09/26/2024 >> current Doxycycline  09/26/2024 >> current  Consultants:  Podiatry  Vascular surgery  Procedures:  09/24/2024: Angiogram with balloon angioplasty 09/25/2024: Amputation right great toe at MPJ level  DVT prophylaxis:  heparin  injection 5,000 Units Start: 09/25/24 2200   Code Status:   Code Status: Full Code  Barriers to discharge: None Therapy evaluation: PT Orders:   PT Follow up Rec: Home Health Pt1/30/2026 1100  Disposition Plan: SNF Status is: Inpatient  Mobility Assessment (Last 72 Hours)     Mobility Assessment  Row Name 09/28/24 9095 09/27/24 2050 09/27/24 1000 09/26/24 2000 09/26/24 1200   Does the patient have exclusion criteria? No- Perform mobility assessment No- Perform mobility assessment No- Perform mobility assessment No- Perform mobility assessment --   What is the highest level of  mobility based on the mobility assessment? Level 4 (Ambulates with assistance) - Balance while stepping forward/back - Complete Level 4 (Ambulates with assistance) - Balance while stepping forward/back - Complete Level 4 (Ambulates with assistance) - Balance while stepping forward/back - Complete Level 4 (Ambulates with assistance) - Balance while stepping forward/back - Complete Level 4 (Ambulates with assistance) - Balance while stepping forward/back - Complete    Row Name 09/26/24 1100 09/26/24 0820 09/25/24 2157 09/25/24 1545     Does the patient have exclusion criteria? -- No- Perform mobility assessment No- Perform mobility assessment No- Perform mobility assessment    What is the highest level of mobility based on the mobility assessment? Level 4 (Ambulates with assistance) - Balance while stepping forward/back - Complete Level 4 (Ambulates with assistance) - Balance while stepping forward/back - Complete Level 4 (Ambulates with assistance) - Balance while stepping forward/back - Complete Level 4 (Ambulates with assistance) - Balance while stepping forward/back - Complete       Diet: Diet Orders (From admission, onward)     Start     Ordered   09/25/24 1249  Diet regular Room service appropriate? Yes; Fluid consistency: Thin  Diet effective now       Question Answer Comment  Room service appropriate? Yes   Fluid consistency: Thin      09/25/24 1248            Objective: Blood pressure (!) 140/80, pulse 94, temperature 97.9 F (36.6 C), temperature source Oral, resp. rate 18, height 6' 3 (1.905 m), weight 66.6 kg, SpO2 100%.  Examination:  Physical Exam Constitutional:      General: He is not in acute distress.    Appearance: Normal appearance.  HENT:     Head: Normocephalic and atraumatic.     Mouth/Throat:     Mouth: Mucous membranes are moist.  Eyes:     Extraocular Movements: Extraocular movements intact.  Cardiovascular:     Rate and Rhythm: Normal rate and  regular rhythm.  Pulmonary:     Effort: Pulmonary effort is normal. No respiratory distress.     Breath sounds: Normal breath sounds. No wheezing.  Abdominal:     General: Bowel sounds are normal. There is no distension.     Palpations: Abdomen is soft.     Tenderness: There is no abdominal tenderness.  Musculoskeletal:     Cervical back: Normal range of motion and neck supple.     Comments: Right foot wrapped in surgical dressing.  No significant bleeding noted from any digits   Skin:    General: Skin is warm and dry.  Neurological:     General: No focal deficit present.     Mental Status: He is alert.  Psychiatric:        Mood and Affect: Mood normal.        Behavior: Behavior normal.      Data Reviewed: No results found for this or any previous visit (from the past 24 hours).   I have reviewed pertinent nursing notes, vitals, labs, and images as necessary. I have ordered labwork to follow up on as indicated.  I have reviewed the last notes from staff over past 24 hours. I have discussed patient's  care plan and test results with nursing staff, CM/SW, and other staff as appropriate.  Old records reviewed in assessment of this patient    LOS: 5 days   Alm Apo, MD [facility] 09/28/2024, 12:20 PM "

## 2024-09-28 NOTE — Plan of Care (Signed)
" °  Problem: Education: Goal: Knowledge of General Education information will improve Description: Including pain rating scale, medication(s)/side effects and non-pharmacologic comfort measures Outcome: Not Progressing   Problem: Health Behavior/Discharge Planning: Goal: Ability to manage health-related needs will improve Outcome: Not Progressing   Problem: Clinical Measurements: Goal: Ability to maintain clinical measurements within normal limits will improve Outcome: Not Progressing Goal: Will remain free from infection Outcome: Not Progressing Goal: Diagnostic test results will improve Outcome: Not Progressing Goal: Respiratory complications will improve Outcome: Not Progressing Goal: Cardiovascular complication will be avoided Outcome: Not Progressing   Problem: Activity: Goal: Risk for activity intolerance will decrease Outcome: Not Progressing   Problem: Coping: Goal: Level of anxiety will decrease Outcome: Not Progressing   Problem: Elimination: Goal: Will not experience complications related to bowel motility Outcome: Not Progressing Goal: Will not experience complications related to urinary retention Outcome: Not Progressing   Problem: Pain Managment: Goal: General experience of comfort will improve and/or be controlled Outcome: Not Progressing   "

## 2024-09-28 NOTE — TOC Progression Note (Signed)
 Transition of Care [facility]) - Progression Note    Patient Details  Name: Seth [name] MRN: [id number] Date of Birth: [birth year]  Transition of Care [facility]) CM/SW Contact  [name], [place] Phone Number: 09/28/2024, 11:38 AM  Clinical Narrative:     Insurance still pending for [facility]F  Expected Discharge Plan: Skilled Nursing Facility Barriers to Discharge: Insurance Authorization               Expected Discharge Plan and Services In-house Referral: Clinical Social Work     Living arrangements for the past 2 months: Assisted Living Facility                                       Social Drivers of Health (SDOH) Interventions SDOH Screenings   Food Insecurity: No Food Insecurity (09/23/2024)  Housing: Low Risk (09/23/2024)  Transportation Needs: No Transportation Needs (09/23/2024)  Utilities: Not At Risk (09/23/2024)  Social Connections: Socially Isolated (09/23/2024)  Tobacco Use: High Risk (09/25/2024)    Readmission Risk Interventions    12/22/2022    4:22 PM  Readmission Risk Prevention Plan  Transportation Screening Complete  HRI or Home Care Consult Complete  Social Work Consult for Recovery Care Planning/Counseling Complete  Palliative Care Screening Not Applicable  Medication Review Oceanographer) Complete

## 2024-09-28 NOTE — Care Management Important Message (Signed)
 Important Message  Patient Details  Name: Seth [name] MRN: [id number] Date of Birth: May 31, 1956   Important Message Given:  Yes - Medicare IM  Printed to unit and staff to provide to patient   Charlann Rayfield Hurst, RN 09/28/2024, 11:48 AM

## 2024-09-29 DIAGNOSIS — I96 Gangrene, not elsewhere classified: Secondary | ICD-10-CM | POA: Diagnosis not present

## 2024-09-29 NOTE — Progress Notes (Signed)
"  °  Subjective:  Patient ID: Seth [name], male    DOB: Jan 22, 1956,  MRN: [id number]  Chief Complaint  Patient presents with   Toe Pain    DOS: 09/25/2024 Procedure: Amputation right great toe at MPJ level  69 y.o. male seen for post op check. Pt reports he is doing well denies pain this am, wearing post op shoe. Plans to go to SNF for a time before going back to ALF.   Review of Systems: Negative except as noted in the HPI. Denies N/V/F/Ch.   Objective:   Constitutional Well developed. Well nourished.  Vascular Foot warm and well perfused. Capillary refill normal to all digits.   No calf pain with palpation  Neurologic Normal speech. Oriented to person, place, and time. Epicritic sensation diminished  Dermatologic Maceration and some necrotic tissue noted at the amputation site, no full thickness dehiscence identified. No active drainage.     Orthopedic: Status post right great toe amputation   Radiographs: Amputation right great toe at MPJ level  Pathology: Pending  Micro: FEW ENTEROCOCCUS FAECALIS  RARE STAPHYLOCOCCUS AUREUS   Assessment:   1. Toe infection   2. Gangrene of toe of right foot (HCC)   3. Critical limb ischemia of right lower extremity (HCC)   4. 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  POD # 4 s/p right great toe amp at MPJ level for gangrene of right great toe -Progressing with some maceration and necrotic tissue at amp site, monitor. Will attempt to dry the area out with change in wound care orders.  - Expect clean margin at amp site re osseous infection -XR: Expected postop changes -WB Status: Weightbearing as tolerated in postop shoe -Sutures: Remain intact 2 to 3 weeks. -Medications/ABX: Recommend continued course p.o. antibiotics Augmentin  and doxycycline  for 5 days  -Dressing:   Recommend daily betadine  wet to dry dressing to the amputation site, paint the amp site with betadine , then apply silver alginate/aquacel AG then cover with dry gauze, kerlix ace wrap. Change daily.  - F/u Plan: Patient stable for discharge from my standpoint,  follow-up next week in the office next week Tuesday.         Marolyn JULIANNA Honour, DPM [facility] / [facility]  "

## 2024-09-29 NOTE — TOC Progression Note (Signed)
 Transition of Care [facility]) - Progression Note    Patient Details  Name: Seth [name] MRN: [id number] Date of Birth: [birth year]  Transition of Care [facility]) CM/SW Contact  [name], [place] Phone Number: 09/29/2024, 7:56 AM  Clinical Narrative:     Insurance is still pending for SNF  Expected Discharge Plan: Skilled Nursing Facility Barriers to Discharge: Insurance Authorization               Expected Discharge Plan and Services In-house Referral: Clinical Social Work     Living arrangements for the past 2 months: Assisted Living Facility                                       Social Drivers of Health (SDOH) Interventions SDOH Screenings   Food Insecurity: No Food Insecurity (09/23/2024)  Housing: Low Risk (09/23/2024)  Transportation Needs: No Transportation Needs (09/23/2024)  Utilities: Not At Risk (09/23/2024)  Social Connections: Socially Isolated (09/23/2024)  Tobacco Use: High Risk (09/25/2024)    Readmission Risk Interventions    12/22/2022    4:22 PM  Readmission Risk Prevention Plan  Transportation Screening Complete  HRI or Home Care Consult Complete  Social Work Consult for Recovery Care Planning/Counseling Complete  Palliative Care Screening Not Applicable  Medication Review Oceanographer) Complete

## 2024-09-29 NOTE — TOC Progression Note (Signed)
 Transition of Care [facility]) - Progression Note    Patient Details  Name: Seth [name] MRN: [id number] Date of Birth: [birth year]  Transition of Care [facility]) CM/SW Contact  [name], [place] Phone Number: 09/29/2024, 3:53 PM  Clinical Narrative:     Insurance denied SNF authorization  Informed [facility]  Care Manger Joen [phone]- informed insurance denied- but she states they can not take him back w/ a new amputation. She states even HH RN wouldn't be enough because- they would only come 1x per week and they believe it will put the patient's wound at risk.    TOC leadership Albino- updated  TOC will continue to follow and assist with discharge planning.   Montie Louder, MSW, LCSW Clinical Social Worker    Expected Discharge Plan: Skilled Nursing Facility Barriers to Discharge: Insurance Authorization               Expected Discharge Plan and Services In-house Referral: Clinical Social Work     Living arrangements for the past 2 months: Assisted Living Facility                                       Social Drivers of Health (SDOH) Interventions SDOH Screenings   Food Insecurity: No Food Insecurity (09/23/2024)  Housing: Low Risk (09/23/2024)  Transportation Needs: No Transportation Needs (09/23/2024)  Utilities: Not At Risk (09/23/2024)  Social Connections: Socially Isolated (09/23/2024)  Tobacco Use: High Risk (09/25/2024)    Readmission Risk Interventions    12/22/2022    4:22 PM  Readmission Risk Prevention Plan  Transportation Screening Complete  HRI or Home Care Consult Complete  Social Work Consult for Recovery Care Planning/Counseling Complete  Palliative Care Screening Not Applicable  Medication Review Oceanographer) Complete

## 2024-09-29 NOTE — Progress Notes (Signed)
 Physical Therapy Treatment Patient Details Name: Seth [name] MRN: [id number] DOB: 03/02/1956 Today's Date: 09/29/2024   History of Present Illness 69 y.o. male presents to [facility] hospital on 09/23/2024 with RLE critical ischemia and dry gangrene. Pt underwent RLE angioplasty on 1/28 and R great toe amputation on 1/29. PMH includes HTN, seizures, traumatic SDH, COPD.    PT Comments  Pt seated up on EOB on arrival, pleasant and eager for mobility. Pt demonstrating continued progress towards acute goals, however continues to be limited in safe mobility by deficits in balance, endurance, power and gait. Pt performing transfers sit<>stand and step pivot transfers with up to min A to power up from low surfaces and to maintain balance. Pt progressing ambulation with RW for support at grossly supervision level with cues for increased DF and clearance on R with post op shoe donned. Pt up in chair at end of session with all needs met. Pt continues to benefit from skilled PT services to progress toward functional mobility goals.      If plan is discharge home, recommend the following: A little help with bathing/dressing/bathroom;Assistance with cooking/housework;Assist for transportation;Help with stairs or ramp for entrance;Direct supervision/assist for medications management   Can travel by private vehicle        Equipment Recommendations  None recommended by PT    Recommendations for Other Services       Precautions / Restrictions Precautions Precautions: Fall Recall of Precautions/Restrictions: Intact Required Braces or Orthoses: Other Brace Other Brace: R post-op shoe Restrictions Weight Bearing Restrictions Per Provider Order: No RLE Weight Bearing Per Provider Order: Weight bearing as tolerated Other Position/Activity Restrictions: WBAT in post-op shoe     Mobility  Bed Mobility Overal bed mobility: Needs Assistance             General bed mobility comments: pt seated up on EOB on  arrival    Transfers Overall transfer level: Needs assistance Equipment used: Rolling Scovell (2 wheels) Transfers: Bed to chair/wheelchair/BSC, Sit to/from Stand Sit to Stand: Min assist, Contact guard assist   Step pivot transfers: Min assist       General transfer comment: pt standing from EOB at lowest height with min A to steady on rise, pt able to stand from [facility] with CGA for safety, cues for safety when returning to sit in recliner at end of session    Ambulation/Gait Ambulation/Gait assistance: Supervision Gait Distance (Feet): 200 Feet Assistive device: Rolling Cockerell (2 wheels) Gait Pattern/deviations: Step-through pattern, Decreased dorsiflexion - right Gait velocity: slightly reduced     General Gait Details: diecreased DF and clearance on R with pt needing cues for increased clearance, no overt LOB   Stairs             Wheelchair Mobility     Tilt Bed    Modified Rankin (Stroke Patients Only)       Balance Overall balance assessment: Needs assistance Sitting-balance support: No upper extremity supported, Feet supported Sitting balance-[name] Scale: Good     Standing balance support: Bilateral upper extremity supported, Reliant on assistive device for balance Standing balance-[name] Scale: Poor Standing balance comment: reliant on UE support                            Communication Communication Communication: No apparent difficulties Factors Affecting Communication: 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)  Cognition Arousal: Alert Behavior During Therapy: WFL for tasks assessed/performed   PT - Cognitive impairments:  No apparent impairments                         Following commands: Intact      Cueing Cueing Techniques: Verbal cues  Exercises      General Comments General comments (skin integrity, edema, etc.): VSS on RA      Pertinent Vitals/Pain Pain Assessment Pain Assessment: No/denies pain    Home Living                           Prior Function            PT Goals (current goals can now be found in the care plan section) Acute Rehab PT Goals Patient Stated Goal: to return home PT Goal Formulation: With patient Time For Goal Achievement: 10/10/24 Progress towards PT goals: Progressing toward goals    Frequency    Min 2X/week      PT Plan      Co-evaluation              AM-PAC PT 6 Clicks Mobility   Outcome Measure  Help needed turning from your back to your side while in a flat bed without using bedrails?: A Little Help needed moving from lying on your back to sitting on the side of a flat bed without using bedrails?: A Little Help needed moving to and from a bed to a chair (including a wheelchair)?: A Little Help needed standing up from a chair using your arms (e.g., wheelchair or bedside chair)?: A Little Help needed to walk in hospital room?: A Little Help needed climbing 3-5 steps with a railing? : A Lot 6 Click Score: 17    End of Session Equipment Utilized During Treatment: Gait belt Activity Tolerance: Patient tolerated treatment well Patient left: in chair;with call bell/phone within reach;with chair alarm set Nurse Communication: Mobility status PT Visit Diagnosis: Other abnormalities of gait and mobility (R26.89);Muscle weakness (generalized) (M62.81);Pain Pain - Right/Left: Right Pain - part of body: Ankle and joints of foot     Time: 9098-9074 PT Time Calculation (min) (ACUTE ONLY): 24 min  Charges:    $Gait Training: 8-22 mins $Therapeutic Activity: 8-22 mins PT General Charges $$ ACUTE PT VISIT: 1 Visit                     Kashmere Staffa R. PTA Acute Rehabilitation Services Office: [phone]   Therisa CHRISTELLA Boor 09/29/2024, 9:51 AM

## 2024-09-30 ENCOUNTER — Telehealth: Payer: Self-pay | Admitting: Podiatry

## 2024-09-30 LAB — AEROBIC/ANAEROBIC CULTURE W GRAM STAIN (SURGICAL/DEEP WOUND)

## 2024-09-30 MED ORDER — ORAL CARE MOUTH RINSE: 15.0000 mL | OROMUCOSAL | Status: AC | PRN

## 2024-09-30 NOTE — Telephone Encounter (Signed)
 Attempted to contact the patient calling [phone] which leads to the front desk of the [facility]. An employee, Charity, answered & disclosed that the patient, Seth [name] had been hospitalized and had not been returned to their facility yet. Advised: please have Seth [name] get in touch with us  to schedule a follow-up appt with Dr. Malvin as soon as he is able.

## 2024-09-30 NOTE — Progress Notes (Signed)
 Pt is alert and fully oriented x 4, afebrile, stable hemodynamically, no distress noted overnight. Pt is able to sleep well with no major complaints. Right foot dressing is dry and clean, no drainage.   Pt has normal loose stools x 5 in 24 hours. Pt denies abdominal pain. We will hold stool softener/laxative today.   Plan of care is reviewed. Pt has been progressing. We will continue to monitor.  Wendi Dash, RN

## 2024-10-01 ENCOUNTER — Ambulatory Visit (HOSPITAL_COMMUNITY): Admission: RE | Admit: 2024-10-01 | Source: Home / Self Care | Admitting: Vascular Surgery

## 2024-10-01 ENCOUNTER — Encounter (HOSPITAL_COMMUNITY): Admission: RE | Payer: Self-pay | Source: Home / Self Care

## 2024-10-01 LAB — CBC WITH DIFFERENTIAL/PLATELET
Abs Immature Granulocytes: 0.04 10*3/uL (ref 0.00–0.07)
Basophils Absolute: 0 10*3/uL (ref 0.0–0.1)
Basophils Relative: 1 %
Eosinophils Absolute: 0.3 10*3/uL (ref 0.0–0.5)
Eosinophils Relative: 4 %
HCT: 41 % (ref 39.0–52.0)
Hemoglobin: 13 g/dL (ref 13.0–17.0)
Immature Granulocytes: 1 %
Lymphocytes Relative: 27 %
Lymphs Abs: 2.1 10*3/uL (ref 0.7–4.0)
MCH: 27.7 pg (ref 26.0–34.0)
MCHC: 31.7 g/dL (ref 30.0–36.0)
MCV: 87.2 fL (ref 80.0–100.0)
Monocytes Absolute: 1.1 10*3/uL — ABNORMAL HIGH (ref 0.1–1.0)
Monocytes Relative: 14 %
Neutro Abs: 4.2 10*3/uL (ref 1.7–7.7)
Neutrophils Relative %: 53 %
Platelets: 346 10*3/uL (ref 150–400)
RBC: 4.7 MIL/uL (ref 4.22–5.81)
RDW: 15 % (ref 11.5–15.5)
WBC: 7.7 10*3/uL (ref 4.0–10.5)
nRBC: 0 % (ref 0.0–0.2)

## 2024-10-01 LAB — BASIC METABOLIC PANEL WITH GFR
Anion gap: 15 (ref 5–15)
BUN: 21 mg/dL (ref 8–23)
CO2: 19 mmol/L — ABNORMAL LOW (ref 22–32)
Calcium: 9.2 mg/dL (ref 8.9–10.3)
Chloride: 103 mmol/L (ref 98–111)
Creatinine, Ser: 1.16 mg/dL (ref 0.61–1.24)
GFR, Estimated: >60 mL/min
Glucose, Bld: 60 mg/dL — ABNORMAL LOW (ref 70–99)
Potassium: 5 mmol/L (ref 3.5–5.1)
Sodium: 137 mmol/L (ref 135–145)

## 2024-10-01 LAB — SURGICAL PATHOLOGY

## 2024-10-01 MED ORDER — SODIUM CHLORIDE 0.9 % IV BOLUS
1000.0000 mL | Freq: Once | INTRAVENOUS | Status: AC
  Administered 2024-10-01: 1000 mL via INTRAVENOUS

## 2024-10-01 NOTE — Progress Notes (Signed)
 " Progress Note   Patient: Seth [name] FMW:[id number] DOB: [birth year] DOA: 09/23/2024     8 DOS: the patient was seen and examined on 10/01/2024     Hospital Course: From HPI Seth [name] is a 69 y.o. 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  He has been followed as an outpatient by his podiatrist Dr. Malvin, as well as vascular surgery.  He recently developed a wound of the right great toe after he got a new pair of shoes, this has gradually developed into gangrene.   He has been followed closely by vascular surgery as an outpatient, they were planning outpatient right lower extremity angiogram with possible SFA/popliteal intervention in the next few weeks.  During his recent vascular surgery clinic visit, he did complain of some purulent drainage from between his 1st and 2nd toes on the right foot, he was noted at his facility today to have fever.  Although the patient denies any chills, fevers, nausea, vomiting, he does state that pain at the medial side of his right foot around the great toe has started to increase.  He presented to the emergency department from his facility, vascular surgery recommends admission to [facility] for urgent revascularization and intervention.       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  Patient has no frank clinical evidence of acute infection, though he does have a small amount of pus being expressed from the wound as well as reported fever previously.   - Will cover empirically, although no frank clinical evidence of acute infection, not  septic. -Empiric IV Zosyn  and IV vancomycin ; transition antibiotics to Augmentin  and doxycycline  to complete 5 days; culture noted to be growing few E. Faecalis (sens to amp) and staph aureus  - Underwent angiogram with vascular surgery on 1/28.  Angioplasty performed to SFA, posterior tibial, and popliteal artery; no stents - Successfully underwent amputation of right great toe on 09/25/2024 with vascular surgery -Evaluated by PT/OT; recommended for home health.  No DME needs - WBAT RLE in postop shoe - Sutures to remain for 2 to 3 weeks - regarding dispo: evaluated by Fredick, unable to return due to recent amputation and wound care status as well as concern for fall risk; will have to now go to SNF first before returning to [facility]. Continue in hospital until SNF found  - Dressing changed by podiatry on 09/29/2024; now on daily dressing changes, see instructions   Hypertension-continue Coreg , amlodipine    Seizure disorder-continue home Keppra , Vimpat , luminal, gabapentin    Hyperlipidemia-Crestor    Interval History:    Continues to do well.  Awaiting insurance approval.  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    Appearance: Normal appearance.  HENT:     Head: Normocephalic and atraumatic.     Mouth/Throat:     Mouth: Mucous  membranes are moist.  Eyes:     Extraocular Movements: Extraocular movements intact.  Cardiovascular:     Rate and Rhythm: Normal rate and regular rhythm.  Pulmonary:     Effort: Pulmonary effort is normal. No respiratory distress.     Breath sounds: Normal breath sounds. No wheezing.  Abdominal:     General: Bowel sounds are normal. There is no distension.     Palpations: Abdomen is soft.     Tenderness: There is no abdominal tenderness.  Musculoskeletal:     Cervical back: Normal range of motion and neck supple.     Comments: Right foot wrapped in surgical dressing.  No significant bleeding noted from any digits   Skin:    General: Skin is warm and dry.  Neurological:     General: No focal deficit present.     Mental Status: He is alert.  Psychiatric:        Mood and Affect: Mood normal.        Behavior: Behavior normal.   Subjective: Patient seen and examined at bedside this morning Did have some dizziness with standing up and orthostatic blood pressure was found to be positive Patient initiated on IV fluids    Data Reviewed:    Latest Ref Rng & Units 09/25/2024    2:57 AM 09/24/2024    8:18 PM 09/24/2024    3:01 AM  CBC  WBC 4.0 - 10.5 K/uL 9.7  8.7  8.1   Hemoglobin 13.0 - 17.0 g/dL 85.7  86.5  87.2   Hematocrit 39.0 - 52.0 % 43.4  41.7  39.3   Platelets 150 - 400 K/uL 400  378  301        Latest Ref Rng & Units 09/25/2024    2:57 AM 09/24/2024    8:18 PM 09/24/2024    3:01 AM  BMP  Glucose 70 - 99 mg/dL 75   87   BUN 8 - 23 mg/dL 11   15   Creatinine 9.38 - 1.24 mg/dL 9.08  9.04  8.97   Sodium 135 - 145 mmol/L 137   134   Potassium 3.5 - 5.1 mmol/L 5.0   4.2   Chloride 98 - 111 mmol/L 101   101   CO2 22 - 32 mmol/L 22   20   Calcium  8.9 - 10.3 mg/dL 9.7   9.0     Vitals:   10/01/24 0737 10/01/24 0831 10/01/24 1034 10/01/24 1138  BP: 122/81 122/81 120/74 116/71  Pulse: 73   76  Resp: 16   18  Temp: 97.6 F (36.4 C)   98.2 F (36.8 C)  TempSrc: Oral    Oral  SpO2: 96%  100%   Weight:      Height:         Author: Drue ONEIDA Potter, MD 10/01/2024 2:02 PM  For on call review [email or web address].  "

## 2024-10-01 NOTE — Plan of Care (Signed)
 ?  Problem: Clinical Measurements: ?Goal: Will remain free from infection ?Outcome: Progressing ?  ?

## 2024-10-01 NOTE — Plan of Care (Signed)
   Problem: Health Behavior/Discharge Planning: Goal: Ability to manage health-related needs will improve Outcome: Progressing

## 2024-10-01 NOTE — Progress Notes (Signed)
 Mobility Specialist Progress Note;   10/01/24 0854  Mobility  Activity Pivoted/transferred from bed to chair  Level of Assistance Contact guard assist, steadying assist  Assistive Device Front wheel [place]  Distance Ambulated (ft) 5 ft  RLE Weight Bearing Per Provider Order WBAT  Activity Response Tolerated well  Mobility Referral Yes  Mobility visit 1 Mobility  Mobility Specialist Start Time (ACUTE ONLY) 0854  Mobility Specialist Stop Time (ACUTE ONLY) 0905  Mobility Specialist Time Calculation (min) (ACUTE ONLY) 11 min   Pt agreeable to mobility w/ encouragement, limited d/t nausea. Don R postop shoe for pt prior to ambulation. Required MinG assistance to stand from elevated bed and safely transfer to chair. VSS throughout. Pt left in chair with all needs met, call bell in reach.   Lauraine Erm Mobility Specialist Please contact via SecureChat or [employer] [phone]

## 2024-10-01 NOTE — Progress Notes (Signed)
 Physical Therapy Treatment Patient Details Name: Seth [name] MRN: [id number] DOB: 1956-02-08 Today's Date: 10/01/2024   History of Present Illness 69 y.o. male presents to [facility] hospital on 09/23/2024 with RLE critical ischemia and dry gangrene. Pt underwent RLE angioplasty on 1/28 and R great toe amputation on 1/29. PMH includes HTN, seizures, traumatic SDH, COPD.    PT Comments  Pt up in chair on arrival, agreeable to session, however limited by nausea. Pt standing from low recliner with min A to boost to stand with pt needing increased time to come to upright standing. Pt able to step over to EOB with RW for support with CGA for safety. Pt declining further gait trials due to nausea and requesting to return to supine. Pt requiring min A to manage RLE into bed. Pt continues to benefit from skilled PT services to progress toward functional mobility goals.      If plan is discharge home, recommend the following: A little help with bathing/dressing/bathroom;Assistance with cooking/housework;Assist for transportation;Help with stairs or ramp for entrance;Direct supervision/assist for medications management   Can travel by private vehicle        Equipment Recommendations  None recommended by PT    Recommendations for Other Services       Precautions / Restrictions Precautions Precautions: Fall Recall of Precautions/Restrictions: Intact Required Braces or Orthoses: Other Brace Other Brace: R post-op shoe Restrictions Weight Bearing Restrictions Per Provider Order: No RLE Weight Bearing Per Provider Order: Weight bearing as tolerated Other Position/Activity Restrictions: WBAT in post-op shoe     Mobility  Bed Mobility Overal bed mobility: Needs Assistance Bed Mobility: Sit to Supine       Sit to supine: Min assist   General bed mobility comments: min A to manage RLE into bed    Transfers Overall transfer level: Needs assistance Equipment used: Rolling Seth [name] (2  wheels) Transfers: Sit to/from Stand, Bed to chair/wheelchair/BSC Sit to Stand: Min assist   Step pivot transfers: Contact guard assist       General transfer comment: min A to boost from low reclienr, increased time to bring COG over COM and bring hands up to RW to come to standing, CGA to step over to EOB    Ambulation/Gait               General Gait Details: pt declining due to nausea   Stairs             Wheelchair Mobility     Tilt Bed    Modified Rankin (Stroke Patients Only)       Balance Overall balance assessment: Needs assistance Sitting-balance support: No upper extremity supported, Feet supported Sitting balance-[name] Scale: Good     Standing balance support: Bilateral upper extremity supported, During functional activity Standing balance-[name] Scale: Poor Standing balance comment: reliant on UE support                            Communication Communication Communication: No apparent difficulties Factors Affecting Communication: 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)  Cognition Arousal: Alert Behavior During Therapy: WFL for tasks assessed/performed   PT - Cognitive impairments: No apparent impairments                         Following commands: Intact      Cueing Cueing Techniques: Verbal cues  Exercises      General Comments General comments (skin integrity, edema, etc.):  VSS on RA      Pertinent Vitals/Pain Pain Assessment Pain Assessment: Faces Faces Pain Scale: Hurts a little bit Pain Location: R foot Pain Descriptors / Indicators: Sore Pain Intervention(s): Monitored during session, Limited activity within patient's tolerance, Repositioned    Home Living                          Prior Function            PT Goals (current goals can now be found in the care plan section) Acute Rehab PT Goals Patient Stated Goal: to return home PT Goal Formulation: With patient Time For Goal  Achievement: 10/10/24 Progress towards PT goals: Not progressing toward goals - comment (nausea)    Frequency    Min 2X/week      PT Plan      Co-evaluation              AM-PAC PT 6 Clicks Mobility   Outcome Measure  Help needed turning from your back to your side while in a flat bed without using bedrails?: A Little Help needed moving from lying on your back to sitting on the side of a flat bed without using bedrails?: A Little Help needed moving to and from a bed to a chair (including a wheelchair)?: A Little Help needed standing up from a chair using your arms (e.g., wheelchair or bedside chair)?: A Little Help needed to walk in hospital room?: A Little Help needed climbing 3-5 steps with a railing? : A Lot 6 Click Score: 17    End of Session Equipment Utilized During Treatment: Gait belt Activity Tolerance: Other (comment) (limited by nausea) Patient left: in bed;with call bell/phone within reach;with bed alarm set Nurse Communication: Mobility status PT Visit Diagnosis: Other abnormalities of gait and mobility (R26.89);Muscle weakness (generalized) (M62.81);Pain     Time: 8993-8976 PT Time Calculation (min) (ACUTE ONLY): 17 min  Charges:    $Therapeutic Activity: 8-22 mins PT General Charges $$ ACUTE PT VISIT: 1 Visit                     Seth [name] R. PTA Acute Rehabilitation Services Office: [phone]   Seth [name] 10/01/2024, 10:37 AM

## 2024-10-02 LAB — BASIC METABOLIC PANEL WITH GFR
Anion gap: 10 (ref 5–15)
BUN: 21 mg/dL (ref 8–23)
CO2: 23 mmol/L (ref 22–32)
Calcium: 9.2 mg/dL (ref 8.9–10.3)
Chloride: 105 mmol/L (ref 98–111)
Creatinine, Ser: 0.93 mg/dL (ref 0.61–1.24)
GFR, Estimated: >60 mL/min
Glucose, Bld: 79 mg/dL (ref 70–99)
Potassium: 4.9 mmol/L (ref 3.5–5.1)
Sodium: 138 mmol/L (ref 135–145)

## 2024-10-02 LAB — CBC
HCT: 37.4 % — ABNORMAL LOW (ref 39.0–52.0)
Hemoglobin: 12.3 g/dL — ABNORMAL LOW (ref 13.0–17.0)
MCH: 27.8 pg (ref 26.0–34.0)
MCHC: 32.9 g/dL (ref 30.0–36.0)
MCV: 84.6 fL (ref 80.0–100.0)
Platelets: 350 10*3/uL (ref 150–400)
RBC: 4.42 MIL/uL (ref 4.22–5.81)
RDW: 14.6 % (ref 11.5–15.5)
WBC: 7.7 10*3/uL (ref 4.0–10.5)
nRBC: 0 % (ref 0.0–0.2)

## 2024-10-02 MED ORDER — LOPERAMIDE HCL 2 MG PO CAPS
4.0000 mg | ORAL_CAPSULE | Freq: Once | ORAL | Status: AC
  Administered 2024-10-02: 4 mg via ORAL
  Filled 2024-10-02: qty 2

## 2024-10-02 NOTE — Progress Notes (Signed)
"   This nurse in to change Patients dressing on operative right great toe wound. Patient wound is a daily dressing change with xeroform, and  a 4 x 4 gauze over the amputated toe incision. This is secured with roll gauze and an ace bandage. Patient has scant drainage on the kelix from the incision. His  incision is healing well with patient able to bear weight through heel of operative foot and ambulate to and from the restroom  "

## 2024-10-02 NOTE — Progress Notes (Signed)
 " Progress Note   Patient: Seth [name] FMW:[id number] DOB: Aug 14, 1956 DOA: 09/23/2024     9 DOS: the patient was seen and examined on 10/02/2024    Hospital Course: From HPI Seth [name] is a 69 y.o. 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  He has been followed as an outpatient by his podiatrist Dr. Malvin, as well as vascular surgery.  He recently developed a wound of the right great toe after he got a new pair of shoes, this has gradually developed into gangrene.   He has been followed closely by vascular surgery as an outpatient, they were planning outpatient right lower extremity angiogram with possible SFA/popliteal intervention in the next few weeks.  During his recent vascular surgery clinic visit, he did complain of some purulent drainage from between his 1st and 2nd toes on the right foot, he was noted at his facility today to have fever.  Although the patient denies any chills, fevers, nausea, vomiting, he does state that pain at the medial side of his right foot around the great toe has started to increase.  He presented to the emergency department from his facility, vascular surgery recommends admission to [facility] for urgent revascularization and intervention.       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  Patient has no frank clinical evidence of acute infection, though he does have a small amount of pus being expressed from the wound as well as reported fever previously.   - Will cover empirically, although no frank clinical evidence of acute infection, not septic. -Empiric IV  Zosyn  and IV vancomycin ; transition antibiotics to Augmentin  and doxycycline  to complete 5 days; culture noted to be growing few E. Faecalis (sens to amp) and staph aureus  - Underwent angiogram with vascular surgery on 1/28.  Angioplasty performed to SFA, posterior tibial, and popliteal artery; no stents - Successfully underwent amputation of right great toe on 09/25/2024 with vascular surgery -Evaluated by PT/OT; recommended for home health.  No DME needs - WBAT RLE in postop shoe - Sutures to remain for 2 to 3 weeks - regarding dispo: evaluated by [facility], unable to return due to recent amputation and wound care status as well as concern for fall risk; will have to now go to SNF first before returning to [facility]. Continue in hospital until SNF found  - Dressing changed by podiatry on 09/29/2024; now on daily dressing changes, see instructions   Hypertension-continue Coreg , amlodipine    Seizure disorder-continue home Keppra , Vimpat , luminal, gabapentin    Hyperlipidemia-Crestor    Interval History:    Continues to do well.  Awaiting insurance approval.  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    Appearance: Normal appearance.  HENT:     Head: Normocephalic and atraumatic.     Mouth/Throat:     Mouth: Mucous membranes are moist.   Eyes:     Extraocular Movements: Extraocular movements intact.  Cardiovascular:     Rate and Rhythm: Normal rate and regular rhythm.  Pulmonary:     Effort: Pulmonary effort is normal. No respiratory distress.     Breath sounds: Normal breath sounds. No wheezing.  Abdominal:     General: Bowel sounds are normal. There is no distension.     Palpations: Abdomen is soft.     Tenderness: There is no abdominal tenderness.  Musculoskeletal:     Cervical back: Normal range of motion and neck supple.     Comments: Right foot wrapped in surgical dressing.  No significant bleeding noted from any digits   Skin:    General: Skin is warm and dry.  Neurological:     General: No focal deficit present.     Mental Status: He is alert.  Psychiatric:        Mood and Affect: Mood normal.        Behavior: Behavior normal.   Subjective: Patient walked about today without significant dizziness Continues to await placement     Data Reviewed:      Latest Ref Rng & Units 10/02/2024    3:33 AM 10/01/2024    6:54 PM 09/25/2024    2:57 AM  CBC  WBC 4.0 - 10.5 K/uL 7.7  7.7  9.7   Hemoglobin 13.0 - 17.0 g/dL 87.6  86.9  85.7   Hematocrit 39.0 - 52.0 % 37.4  41.0  43.4   Platelets 150 - 400 K/uL 350  346  400        Latest Ref Rng & Units 10/02/2024    6:05 AM 10/01/2024    6:54 PM 09/25/2024    2:57 AM  BMP  Glucose 70 - 99 mg/dL 79  60  75   BUN 8 - 23 mg/dL 21  21  11    Creatinine 0.61 - 1.24 mg/dL 9.06  8.83  9.08   Sodium 135 - 145 mmol/L 138  137  137   Potassium 3.5 - 5.1 mmol/L 4.9  5.0  5.0   Chloride 98 - 111 mmol/L 105  103  101   CO2 22 - 32 mmol/L 23  19  22    Calcium  8.9 - 10.3 mg/dL 9.2  9.2  9.7     Vitals:   10/02/24 0336 10/02/24 0811 10/02/24 0822 10/02/24 1224  BP: 108/68 108/68 123/79 109/65  Pulse: 73  65 71  Resp: 17  18 16   Temp: 98.4 F (36.9 C)  98 F (36.7 C) 97.9 F (36.6 C)  TempSrc: Oral  Oral Oral  SpO2: 100%  95% 100%  Weight:      Height:          Author: Drue ONEIDA Potter, MD 10/02/2024 4:32 PM  For on call review [email or web address].  "

## 2024-10-02 NOTE — Progress Notes (Signed)
 MD: Would it be possible to teach this patient how to do his own dressing so that he can go back to [facility]?

## 2024-10-03 ENCOUNTER — Other Ambulatory Visit: Payer: Self-pay | Admitting: Vascular Surgery

## 2024-10-03 DIAGNOSIS — I739 Peripheral vascular disease, unspecified: Secondary | ICD-10-CM

## 2024-10-03 DIAGNOSIS — I70235 Atherosclerosis of native arteries of right leg with ulceration of other part of foot: Secondary | ICD-10-CM

## 2024-10-03 LAB — CBC
HCT: 36.6 % — ABNORMAL LOW (ref 39.0–52.0)
Hemoglobin: 11.9 g/dL — ABNORMAL LOW (ref 13.0–17.0)
MCH: 27.7 pg (ref 26.0–34.0)
MCHC: 32.5 g/dL (ref 30.0–36.0)
MCV: 85.3 fL (ref 80.0–100.0)
Platelets: 341 10*3/uL (ref 150–400)
RBC: 4.29 MIL/uL (ref 4.22–5.81)
RDW: 15 % (ref 11.5–15.5)
WBC: 7 10*3/uL (ref 4.0–10.5)
nRBC: 0 % (ref 0.0–0.2)

## 2024-10-03 LAB — BASIC METABOLIC PANEL WITH GFR
Anion gap: 10 (ref 5–15)
BUN: 23 mg/dL (ref 8–23)
CO2: 22 mmol/L (ref 22–32)
Calcium: 8.9 mg/dL (ref 8.9–10.3)
Chloride: 104 mmol/L (ref 98–111)
Creatinine, Ser: 0.95 mg/dL (ref 0.61–1.24)
GFR, Estimated: >60 mL/min
Glucose, Bld: 81 mg/dL (ref 70–99)
Potassium: 4.2 mmol/L (ref 3.5–5.1)
Sodium: 137 mmol/L (ref 135–145)

## 2024-10-03 NOTE — Plan of Care (Signed)

## 2024-10-03 NOTE — Progress Notes (Signed)
 Physical Therapy Treatment Patient Details Name: Seth [name] MRN: [id number] DOB: 01-04-1956 Today's Date: 10/03/2024   History of Present Illness 69 y.o. male presents to [facility] hospital on 09/23/2024 with RLE critical ischemia and dry gangrene. Pt underwent RLE angioplasty on 1/28 and R great toe amputation on 1/29. PMH includes HTN, seizures, traumatic SDH, COPD.    PT Comments  Pt resting in bed on arrival, agreeable to session and demonstrating continued progress towards acute goals. Session focused on progression of mobility and endurance with pt demonstrating all mobility with increased independence. Pt performing bed mobility, transfers sit<>stand and gait with RW for support with grossly CGA-supervision for safety. Pt able to donn/doff post op shoe x2 with set up and verbal cues initially with need for cues fading with task practice. Pt continues to benefit from skilled PT services to progress toward functional mobility goals.     If plan is discharge home, recommend the following: A little help with bathing/dressing/bathroom;Assistance with cooking/housework;Assist for transportation;Help with stairs or ramp for entrance;Direct supervision/assist for medications management   Can travel by private vehicle        Equipment Recommendations  None recommended by PT    Recommendations for Other Services       Precautions / Restrictions Precautions Precautions: Fall Recall of Precautions/Restrictions: Intact Required Braces or Orthoses: Other Brace Other Brace: R post-op shoe Restrictions Weight Bearing Restrictions Per Provider Order: No RLE Weight Bearing Per Provider Order: Weight bearing as tolerated Other Position/Activity Restrictions: WBAT in post-op shoe     Mobility  Bed Mobility Overal bed mobility: Needs Assistance Bed Mobility: Sit to Supine, Supine to Sit     Supine to sit: Modified independent (Device/Increase time) Sit to supine: Supervision   General bed  mobility comments: supervision for safety with increased time to birng RLE up into bed    Transfers Overall transfer level: Needs assistance Equipment used: Rolling [name] (2 wheels) Transfers: Sit to/from Stand, Bed to chair/wheelchair/BSC Sit to Stand: Contact guard assist           General transfer comment: pt stanidng from slightly elevated EOB due to pt height, CGA for safety but no physical assist needed    Ambulation/Gait Ambulation/Gait assistance: Supervision Gait Distance (Feet): 225 Feet Assistive device: Rolling [name] (2 wheels) Gait Pattern/deviations: Step-through pattern, Decreased dorsiflexion - right Gait velocity: slightly reduced     General Gait Details: steady without LOB, fair clearance of RLE with post op shoe   Stairs             Wheelchair Mobility     Tilt Bed    Modified Rankin (Stroke Patients Only)       Balance Overall balance assessment: Needs assistance Sitting-balance support: No upper extremity supported, Feet supported Sitting balance-[name] Scale: Good Sitting balance - Comments: able to bend down and don post op shoe is sitting without LOB   Standing balance support: Bilateral upper extremity supported, During functional activity Standing balance-[name] Scale: Fair Standing balance comment: able to maintain static stance without UE support, needing BUE support of RW for dynamic tasks                            Communication Communication Communication: No apparent difficulties Factors Affecting Communication: 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)  Cognition Arousal: Alert Behavior During Therapy: WFL for tasks assessed/performed   PT - Cognitive impairments: No apparent impairments  Following commands: Intact      Cueing Cueing Techniques: Verbal cues  Exercises      General Comments General comments (skin integrity, edema, etc.): VSS on RA, with set up pt able to  donn/doff post op shoe with increased time x2 trials      Pertinent Vitals/Pain Pain Assessment Pain Assessment: Faces Pain Score: 0-No pain Pain Intervention(s): Monitored during session    Home Living                          Prior Function            PT Goals (current goals can now be found in the care plan section) Acute Rehab PT Goals Patient Stated Goal: to return home PT Goal Formulation: With patient Time For Goal Achievement: 10/10/24 Progress towards PT goals: Progressing toward goals    Frequency    Min 2X/week      PT Plan      Co-evaluation              AM-PAC PT 6 Clicks Mobility   Outcome Measure  Help needed turning from your back to your side while in a flat bed without using bedrails?: A Little Help needed moving from lying on your back to sitting on the side of a flat bed without using bedrails?: A Little Help needed moving to and from a bed to a chair (including a wheelchair)?: A Little Help needed standing up from a chair using your arms (e.g., wheelchair or bedside chair)?: A Little Help needed to walk in hospital room?: A Little Help needed climbing 3-5 steps with a railing? : A Lot 6 Click Score: 17    End of Session Equipment Utilized During Treatment: Gait belt Activity Tolerance: Patient tolerated treatment well Patient left: in bed;with call bell/phone within reach Nurse Communication: Mobility status PT Visit Diagnosis: Other abnormalities of gait and mobility (R26.89);Muscle weakness (generalized) (M62.81);Pain Pain - Right/Left: Right Pain - part of body: Ankle and joints of foot     Time: 9152-9093 PT Time Calculation (min) (ACUTE ONLY): 19 min  Charges:    $Therapeutic Activity: 8-22 mins PT General Charges $$ ACUTE PT VISIT: 1 Visit                     Seth [name] R. PTA Acute Rehabilitation Services Office: [phone]   Seth [name] 10/03/2024, 9:29 AM

## 2024-10-03 NOTE — Progress Notes (Signed)
 Occupational Therapy Treatment Patient Details Name: Seth [name] MRN: [id number] DOB: May 20, 1956 Today's Date: 10/03/2024   History of present illness 69 y.o. male presents to [facility] hospital on 09/23/2024 with RLE critical ischemia and dry gangrene. Pt underwent RLE angioplasty on 1/28 and R great toe amputation on 1/29. PMH includes HTN, seizures, traumatic SDH, COPD.   OT comments  Patient demonstrating good gains with OT treatment with bed mobility, LB bathing and dressing, and transfers.  Discharge recommendations changed from SNF to home health the [facility] to follow and family to assist due to progress.  Acute OT to continue to follow to address established goals.       If plan is discharge home, recommend the following:  A little help with walking and/or transfers;Assist for transportation;Help with stairs or ramp for entrance;Assistance with cooking/housework;A lot of help with bathing/dressing/bathroom   Equipment Recommendations  None recommended by OT    Recommendations for Other Services      Precautions / Restrictions Precautions Precautions: Fall Recall of Precautions/Restrictions: Intact Required Braces or Orthoses: Other Brace Other Brace: R post-op shoe Restrictions Weight Bearing Restrictions Per Provider Order: No RLE Weight Bearing Per Provider Order: Weight bearing as tolerated Other Position/Activity Restrictions: WBAT in post-op shoe       Mobility Bed Mobility Overal bed mobility: Needs Assistance Bed Mobility: Supine to Sit     Supine to sit: Modified independent (Device/Increase time)     General bed mobility comments: increased time    Transfers Overall transfer level: Needs assistance Equipment used: Rolling Preusser (2 wheels) Transfers: Sit to/from Stand, Bed to chair/wheelchair/BSC Sit to Stand: Contact guard assist           General transfer comment: CGA from EOB     Balance Overall balance assessment: Needs assistance Sitting-balance  support: No upper extremity supported, Feet supported Sitting balance-[name] Scale: Good Sitting balance - Comments: EOB   Standing balance support: Single extremity supported, Bilateral upper extremity supported, During functional activity Standing balance-[name] Scale: Fair Standing balance comment: able to stand with one extremity support for peri care                           ADL either performed or assessed with clinical judgement   ADL Overall ADL's : Needs assistance/impaired     Grooming: Supervision/safety;Standing       Lower Body Bathing: Contact guard assist;Sit to/from stand Lower Body Bathing Details (indicate cue type and reason): peri area back     Lower Body Dressing: Minimal assistance;Sit to/from stand Lower Body Dressing Details (indicate cue type and reason): post op shoe Toilet Transfer: Ambulation;Rolling Decamp (2 wheels);Contact guard assist Toilet Transfer Details (indicate cue type and reason): simulated                Extremity/Trunk Assessment              Vision       Perception     Praxis     Communication Communication Communication: No apparent difficulties Factors Affecting Communication: 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)   Cognition Arousal: Alert Behavior During Therapy: WFL for tasks assessed/performed Cognition: No apparent impairments                               Following commands: Intact        Cueing   Cueing Techniques: Verbal cues  Exercises  Shoulder Instructions       General Comments VSS on RA    Pertinent Vitals/ Pain       Pain Assessment Pain Assessment: Faces Faces Pain Scale: No hurt Pain Intervention(s): Monitored during session  Home Living                                          Prior Functioning/Environment              Frequency  Min 2X/week        Progress Toward Goals  OT Goals(current goals can now be found in the  care plan section)  Progress towards OT goals: Progressing toward goals  Acute Rehab OT Goals Patient Stated Goal: to go home OT Goal Formulation: With patient Time For Goal Achievement: 10/10/24 Potential to Achieve Goals: Good  Plan      Co-evaluation                 AM-PAC OT 6 Clicks Daily Activity     Outcome Measure   Help from another person eating meals?: None Help from another person taking care of personal grooming?: A Little Help from another person toileting, which includes using toliet, bedpan, or urinal?: A Little Help from another person bathing (including washing, rinsing, drying)?: A Little Help from another person to put on and taking off regular upper body clothing?: A Little Help from another person to put on and taking off regular lower body clothing?: A Little 6 Click Score: 19    End of Session Equipment Utilized During Treatment: Rolling Fedder (2 wheels);Other (comment) (RLE post-op shoe)  OT Visit Diagnosis: Unsteadiness on feet (R26.81);Other abnormalities of gait and mobility (R26.89);Muscle weakness (generalized) (M62.81)   Activity Tolerance Patient tolerated treatment well   Patient Left in chair;with call bell/phone within reach;with chair alarm set   Nurse Communication Mobility status        Time: 9267-9247 OT Time Calculation (min): 20 min  Charges: OT General Charges $OT Visit: 1 Visit OT Treatments $Self Care/Home Management : 8-22 mins  Seth [name], OTA Acute Rehabilitation Services  Office [phone]   Seth [name] 10/03/2024, 9:43 AM

## 2024-10-03 NOTE — Progress Notes (Signed)
 " Progress Note   Patient: Seth [name] FMW:[id number] DOB: 18-Nov-1955 DOA: 09/23/2024     10 DOS: the patient was seen and examined on 10/03/2024     Hospital Course: From HPI Seth [name] is a 69 y.o. 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  He has been followed as an outpatient by his podiatrist Dr. Malvin, as well as vascular surgery.  He recently developed a wound of the right great toe after he got a new pair of shoes, this has gradually developed into gangrene.   He has been followed closely by vascular surgery as an outpatient, they were planning outpatient right lower extremity angiogram with possible SFA/popliteal intervention in the next few weeks.  During his recent vascular surgery clinic visit, he did complain of some purulent drainage from between his 1st and 2nd toes on the right foot, he was noted at his facility today to have fever.  Although the patient denies any chills, fevers, nausea, vomiting, he does state that pain at the medial side of his right foot around the great toe has started to increase.  He presented to the emergency department from his facility, vascular surgery recommends admission to [facility] for urgent revascularization and intervention.     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 Faecalis (sens to amp) and staph aureus  Has completed empiric antibiotic coverage - Underwent angiogram with  vascular surgery on 1/28.  Angioplasty performed to SFA, posterior tibial, and popliteal artery; no stents - Successfully underwent amputation of right great toe on 09/25/2024 with vascular surgery - WBAT RLE in postop shoe - Sutures to remain for 2 to 3 weeks - Dressing changed by podiatry on 09/29/2024; now on daily dressing changes, see instructions   Hypertension-continue Coreg , amlodipine    Seizure disorder-continue home Keppra , Vimpat , luminal, gabapentin    Hyperlipidemia-Crestor      Consultants:  Podiatry  Vascular surgery   Procedures:  09/24/2024: Angiogram with balloon angioplasty 09/25/2024: Amputation right great toe at MPJ level   DVT prophylaxis:  heparin  injection 5,000 Units Start: 09/25/24 2200     Code Status:   Code Status: Full Code     PT Follow up Rec: Home Health  Disposition Plan:   regarding dispo: evaluated by Fredick, unable to return due to recent amputation and wound care status as well as concern for fall risk; will have to now go to SNF first before returning to [facility]. Continue in hospital until SNF found  TOC working on SNF placement      Physical Exam Constitutional:      General: He is not in acute distress.    Appearance: Normal appearance.  HENT:     Head: Normocephalic and atraumatic.     Mouth/Throat:     Mouth: Mucous membranes are moist.  Eyes:     Extraocular Movements: Extraocular movements intact.  Cardiovascular:     Rate and Rhythm: Normal rate and regular rhythm.  Pulmonary:     Effort: Pulmonary effort is  normal. No respiratory distress.     Breath sounds: Normal breath sounds. No wheezing.  Abdominal:     General: Bowel sounds are normal. There is no distension.     Palpations: Abdomen is soft.     Tenderness: There is no abdominal tenderness.  Musculoskeletal:     Cervical back: Normal range of motion and neck supple.     Comments: Right foot wrapped in surgical dressing.  No significant bleeding noted from  any digits   Skin:    General: Skin is warm and dry.  Neurological:     General: No focal deficit present.     Mental Status: He is alert.  Psychiatric:        Mood and Affect: Mood normal.        Behavior: Behavior normal.   Subjective: Patient walked about today without significant dizziness Continues to await placement Patient's facility will not accept him and so TOC working on SNF     Data Reviewed:    Vitals:   10/02/24 2001 10/02/24 2341 10/03/24 0326 10/03/24 1340  BP: 117/76 108/65 118/74 (!) 148/93  Pulse: 70 83 60 74  Resp: 18 18 18 18   Temp: 97.7 F (36.5 C) 98.4 F (36.9 C) (!) 97.4 F (36.3 C) 97.6 F (36.4 C)  TempSrc: Oral Oral Oral Oral  SpO2: 100% 93% 96% 100%  Weight:      Height:          Latest Ref Rng & Units 10/03/2024    3:22 AM 10/02/2024    3:33 AM 10/01/2024    6:54 PM  CBC  WBC 4.0 - 10.5 K/uL 7.0  7.7  7.7   Hemoglobin 13.0 - 17.0 g/dL 88.0  87.6  86.9   Hematocrit 39.0 - 52.0 % 36.6  37.4  41.0   Platelets 150 - 400 K/uL 341  350  346        Latest Ref Rng & Units 10/03/2024    3:22 AM 10/02/2024    6:05 AM 10/01/2024    6:54 PM  BMP  Glucose 70 - 99 mg/dL 81  79  60   BUN 8 - 23 mg/dL 23  21  21    Creatinine 0.61 - 1.24 mg/dL 9.04  9.06  8.83   Sodium 135 - 145 mmol/L 137  138  137   Potassium 3.5 - 5.1 mmol/L 4.2  4.9  5.0   Chloride 98 - 111 mmol/L 104  105  103   CO2 22 - 32 mmol/L 22  23  19    Calcium  8.9 - 10.3 mg/dL 8.9  9.2  9.2      Author: Drue ONEIDA Potter, MD 10/03/2024 3:25 PM  For on call review [email or web address].  "

## 2024-10-03 NOTE — Progress Notes (Addendum)
"   This nurse in room to evaluate patient wound patient wound dressing has small quarter size area of drainage on Kerlix roll gauze. Wound healing well. Patient unable with current hand dexterity to unwrap and wrap foot wound but is able to hold gauze and assist this nurse to complete dressing change. Patient wound care is minimal and takes minimal effort and time to complete. Patient wound care orders as follows: small piece of xeroform and 4 x 4 gauze applied to amputation incision. This was secured with new roll gauze and ace bandage. Patient able to complete ADL's with the dressing on. Patient ambulated 500 feet this am using front wheel Barkan with PTA.     "

## 2024-10-29 ENCOUNTER — Ambulatory Visit: Admitting: Neurology

## 2024-10-30 ENCOUNTER — Ambulatory Visit (HOSPITAL_COMMUNITY)

## 2024-10-30 ENCOUNTER — Encounter
